# Patient Record
Sex: Male | Born: 1945 | Race: White | Hispanic: No | Smoking: Former smoker
Health system: Southern US, Community
[De-identification: ages and names within clinical notes are randomized; demographics above are authoritative.]

## PROBLEM LIST (undated history)

## (undated) DIAGNOSIS — I6529 Occlusion and stenosis of unspecified carotid artery: Secondary | ICD-10-CM

## (undated) DIAGNOSIS — M1990 Unspecified osteoarthritis, unspecified site: Secondary | ICD-10-CM

## (undated) DIAGNOSIS — N189 Chronic kidney disease, unspecified: Secondary | ICD-10-CM

## (undated) DIAGNOSIS — E039 Hypothyroidism, unspecified: Secondary | ICD-10-CM

## (undated) DIAGNOSIS — C801 Malignant (primary) neoplasm, unspecified: Secondary | ICD-10-CM

## (undated) DIAGNOSIS — J449 Chronic obstructive pulmonary disease, unspecified: Secondary | ICD-10-CM

## (undated) DIAGNOSIS — G7000 Myasthenia gravis without (acute) exacerbation: Secondary | ICD-10-CM

## (undated) DIAGNOSIS — I509 Heart failure, unspecified: Secondary | ICD-10-CM

## (undated) DIAGNOSIS — R0602 Shortness of breath: Secondary | ICD-10-CM

## (undated) DIAGNOSIS — K219 Gastro-esophageal reflux disease without esophagitis: Secondary | ICD-10-CM

## (undated) DIAGNOSIS — I739 Peripheral vascular disease, unspecified: Secondary | ICD-10-CM

## (undated) DIAGNOSIS — I499 Cardiac arrhythmia, unspecified: Secondary | ICD-10-CM

## (undated) DIAGNOSIS — G20A1 Parkinson's disease without dyskinesia, without mention of fluctuations: Secondary | ICD-10-CM

## (undated) DIAGNOSIS — R200 Anesthesia of skin: Secondary | ICD-10-CM

## (undated) DIAGNOSIS — J42 Unspecified chronic bronchitis: Secondary | ICD-10-CM

## (undated) DIAGNOSIS — M4807 Spinal stenosis, lumbosacral region: Secondary | ICD-10-CM

## (undated) DIAGNOSIS — I2089 Other forms of angina pectoris: Secondary | ICD-10-CM

## (undated) DIAGNOSIS — E7800 Pure hypercholesterolemia, unspecified: Secondary | ICD-10-CM

## (undated) DIAGNOSIS — I10 Essential (primary) hypertension: Secondary | ICD-10-CM

## (undated) DIAGNOSIS — I4891 Unspecified atrial fibrillation: Secondary | ICD-10-CM

## (undated) DIAGNOSIS — N183 Chronic kidney disease, stage 3 unspecified: Secondary | ICD-10-CM

## (undated) DIAGNOSIS — G4730 Sleep apnea, unspecified: Secondary | ICD-10-CM

## (undated) DIAGNOSIS — M5030 Other cervical disc degeneration, unspecified cervical region: Secondary | ICD-10-CM

## (undated) DIAGNOSIS — I208 Other forms of angina pectoris: Secondary | ICD-10-CM

## (undated) HISTORY — DX: Heart failure, unspecified: I50.9

## (undated) HISTORY — PX: ATRIAL FIBRILLATION ABLATION: EP1191

## (undated) HISTORY — PX: COLONOSCOPY: SHX174

## (undated) HISTORY — DX: Parkinson's disease without dyskinesia, without mention of fluctuations: G20.A1

## (undated) HISTORY — PX: BACK SURGERY: SHX140

## (undated) HISTORY — PX: BILATERAL CARPAL TUNNEL RELEASE: SHX6508

## (undated) HISTORY — PX: HERNIA REPAIR: SHX51

## (undated) HISTORY — PX: CARDIAC CATHETERIZATION: SHX172

---

## 2004-05-18 ENCOUNTER — Other Ambulatory Visit: Payer: Self-pay

## 2005-12-02 ENCOUNTER — Emergency Department: Payer: Self-pay | Admitting: Emergency Medicine

## 2005-12-03 ENCOUNTER — Other Ambulatory Visit: Payer: Self-pay

## 2009-05-08 ENCOUNTER — Ambulatory Visit: Payer: Self-pay | Admitting: Gastroenterology

## 2011-06-06 ENCOUNTER — Ambulatory Visit: Payer: Self-pay | Admitting: Unknown Physician Specialty

## 2011-07-13 ENCOUNTER — Encounter (HOSPITAL_COMMUNITY): Payer: Self-pay

## 2011-07-13 ENCOUNTER — Encounter (HOSPITAL_COMMUNITY)
Admission: RE | Admit: 2011-07-13 | Discharge: 2011-07-13 | Disposition: A | Discharge status: Home / Self Care | Payer: 59 | Source: Ambulatory Visit | Attending: Neurosurgery | Admitting: Neurosurgery

## 2011-07-13 HISTORY — DX: Hypothyroidism, unspecified: E03.9

## 2011-07-13 HISTORY — DX: Essential (primary) hypertension: I10

## 2011-07-13 LAB — BASIC METABOLIC PANEL
CO2: 24 mEq/L (ref 19–32)
Calcium: 9.7 mg/dL (ref 8.4–10.5)
Creatinine, Ser: 1.38 mg/dL — ABNORMAL HIGH (ref 0.50–1.35)
Glucose, Bld: 84 mg/dL (ref 70–99)
Sodium: 143 mEq/L (ref 135–145)

## 2011-07-13 LAB — DIFFERENTIAL
Lymphocytes Relative: 36 % (ref 12–46)
Lymphs Abs: 3.2 10*3/uL (ref 0.7–4.0)
Monocytes Relative: 9 % (ref 3–12)
Neutro Abs: 4.4 10*3/uL (ref 1.7–7.7)
Neutrophils Relative %: 50 % (ref 43–77)

## 2011-07-13 LAB — CBC
Hemoglobin: 12.1 g/dL — ABNORMAL LOW (ref 13.0–17.0)
MCH: 30.4 pg (ref 26.0–34.0)
MCV: 90.5 fL (ref 78.0–100.0)
RBC: 3.98 MIL/uL — ABNORMAL LOW (ref 4.22–5.81)

## 2011-07-13 LAB — ABO/RH: ABO/RH(D): B NEG

## 2011-07-13 LAB — SURGICAL PCR SCREEN: Staphylococcus aureus: NEGATIVE

## 2011-07-13 LAB — TYPE AND SCREEN: Antibody Screen: NEGATIVE

## 2011-07-13 NOTE — Patient Instructions (Addendum)
Pain Relief Preoperatively and Postoperatively Being a good patient does not mean being a silent one.If you have questions, problems, or concerns about the pain you may feel after surgery, let your caregiver know.Patients have the right to assessment and management of pain. The treatment of pain after surgery is important to speed up recovery and return to normal activities. Severe pain after surgery, and the fear or anxiety associated with that pain, may cause extreme discomfort that:  Prevents sleep.   Decreases the ability to breathe deeply and cough (causing pneumonia or other upper airway infections).   Causes your heart to beat faster and your blood pressure to be higher.   Increases the risk for constipation and bloating.   Decreases the ability of wounds to heal.   May result in depression, increased anxiety, and feelings of helplessness.  Relief of pain before surgery is also important because it will lessen the pain after surgery. Patients who receive both pain relief before and after surgery experience greater pain relief than those who only receive pain relief after the surgery. Let your caregiver know if you are having uncontrolled pain. [place] your nurse immediately if you are in pain after your surgery.This is very important.Pain after surgery is more difficult to manage if it is permitted to become severe, so prompt and adequate treatment of acute pain is necessary. Your caregivers follow policies and procedures about the management of patient pain.These guidelines should be explained to you before surgery.Plans for pain control after surgery must be mutually decided upon and instituted with your full understanding and agreement.Do not be afraid to ask questions of your physicians or nurses regarding the care you are receiving.There are many different ways your caregivers will attempt to control your pain.These may include, but are not limited  to:  Opioids (narcotics).   Nonsteroidal anti-inflammatory drugs.   Medicine that numbs the area (local anesthetic) including:   An injection of pain medicine near where the pain is (local infiltration).   An injection of pain medicine near the nerve that controls the sensation to a specific part of the body (peripheral nerve block).   Medicine continuously put in the spine to block pain (epidural).   Medicine put in the spine to block pain (spinal).   Steroids.   Physical therapy.   Heat and cold.   Compression, such as wrapping an elastic bandage around the area of pain.   Massage.  These different ways of controlling pain may be used together. An example of a patient having 3 methods of pain control would be a surgical patient given intravenous (IV) morphine, oral ibuprofen, and local anesthetic injected around the surgery site. Different ways of controlling pain used together may also be called multimodal analgesia. Using this approach has many benefits, including being able to do these things sooner:  Eat.   Move around.   Leave the hospital.  Moderate to moderately severe acute pain after surgery may respond to opioids (morphine, demerol, or dilaudid).Non-narcotic medicines are often combined with opioids to improve pain relief, diminish the risk of side effects, and reduce the chance of addiction.If you follow your caregiver's directions about taking narcotic medicine, and you do not have a history of substance abuse, your risk of becoming addicted is exceptionally small.Opioids, used for the treatment of acute pain after surgery, are given for short periods of time in careful doses to prevent addiction. If used as directed by your caregiver, narcotics are:  Effective in managing acute  pain after surgery.   Safe.   Not addictive.  HOME CARE INSTRUCTIONS  Only take over-the-counter or prescription medicines for pain, discomfort, or fever as directed by your  caregiver. Do not take aspirin. Aspirin increases the possibility for bleeding.   Get proper rest.   Apply an ice pack to the site of your surgery for the first 2 days, if directed by your surgeon.  SEEK IMMEDIATE MEDICAL CARE IF: There is increasing pain, which is not controlled with medicines. Document Released: 11/19/2002 Document Revised: 03/14/2011 Document Reviewed: 11/23/2010 [facility] Patient Information 2012 [place], [place].MRSA Overview MRSA stands for methicillin-resistant Staphylococcus aureus. It is a type of bacteria that is resistant to some common antibiotics. It can cause infections in the skin and many other places in the body. Staphylococcus aureus, often called "staph," is a bacteria that normally lives on the skin or in the nose. Staph on the surface of the skin or in the nose does not cause problems. However, if the staph enters the body through a cut, wound, or break in the skin, an infection can happen. Up until recently, infections with the MRSA type of staph mainly occurred in hospitals and other healthcare settings. There are now increasing problems with MRSA infections in the community as well. Infections with MRSA may be very serious or even life-threatening. Most MRSA infections are acquired in one of two ways:  Healthcare-associated MRSA (HA-MRSA)   This can be acquired by people in any healthcare setting. MRSA can be a big problem for hospitalized people, people in nursing homes, people in rehabilitation facilities, people with weakened immune systems, dialysis patients, and those who have had surgery.   Community-associated MRSA (CA-MRSA)   Community spread of MRSA is becoming more common. It is known to spread in crowded settings, in jails and prisons, and in situations where there is close skin-to-skin contact, such as during sporting events or in locker rooms. MRSA can be spread through shared items, such as children's toys, razors, towels, or sports equipment.   CAUSES  All staph, including MRSA, are normally harmless unless they enter the body through a scratch, cut, or wound, such as with surgery. All staph, including MRSA, can be spread from person-to-person by touching contaminated objects or through direct contact. SPECIAL GROUPS MRSA can present problems for special groups of people. Some of these groups include:  Breastfeeding women.   The most common problem is MRSA infection of the breast (mastitis). There is evidence that MRSA can be passed to an infant from infected breast milk. Your caregiver may recommend that you stop breastfeeding until the mastitis is under control.   If you are breastfeeding and have a MRSA infection in a place other than the breast, you may usually continue breastfeeding while under treatment. If taking antibiotics, ask your caregiver if it is safe to continue breastfeeding while taking your prescribed medicines.   Neonates (babies from birth to 44 month old) and infants (babies from 28 month to 61 year old).   There is evidence that MRSA can be passed to a newborn at birth if the mother has MRSA on the skin, in or around the birth canal, or an infection in the uterus, cervix, or vagina. MRSA infection can have the same appearance as a normal newborn or infant rash or several other skin infections. This can make it hard to diagnose MRSA.   Immune compromised people.   If you have an immune system problem, you may have a higher chance of developing  a MRSA infection.   People after any type of surgery.   Staph in general, including MRSA, is the most common cause of infections occurring at the site of recent surgery.   People on long-term steroid medicines.   These kinds of medicines can lower your resistance to infection. This can increase your chance of getting MRSA.   People who have had frequent hospitalizations, live in nursing homes or other residential care facilities, have venous or urinary catheters, or have  taken multiple courses of antibiotic therapy for any reason.  DIAGNOSIS  Diagnosis of MRSA is done by cultures of fluid samples that may come from:  Swabs taken from cuts or wounds in infected areas.   Nasal swabs.   Saliva or deep cough specimens from the lungs (sputum).   Urine.   Blood.  Many people are "colonized" with MRSA but have no signs of infection. This means that people carry the MRSA germ on their skin or in their nose and may never develop MRSA infection.  TREATMENT  Treatment varies and is based on how serious, how deep, or how extensive the infection is. For example:  Some skin infections, such as a small boil or abscess, may be treated by draining yellowish-white fluid (pus) from the site of the infection.   Deeper or more widespread soft tissue infections are usually treated with surgery to drain pus and with antibiotic medicine given by vein or by mouth. This may be recommended even if you are pregnant.   Serious infections may require a hospital stay.  If antibiotics are given, they may be needed for several weeks. PREVENTION  Because many people are colonized with staph, including MRSA, preventing the spread of the bacteria from person-to-person is most important. The best way to prevent the spread of bacteria and other germs is through proper hand washing or by using alcohol-based hand disinfectants. The following are other ways to help prevent MRSA infection within the hospital and community settings.   Healthcare settings:   Strict hand washing or hand disinfection procedures need to be followed before and after touching every patient.   Patients infected with MRSA are placed in isolation to prevent the spread of the bacteria.   Healthcare workers need to wear disposable gowns and gloves when touching or caring for patients infected with MRSA. Visitors may also be asked to wear a gown and gloves.   Hospital surfaces need to be disinfected frequently.    Community settings:   NIKE frequently with soap and water for at least 15 seconds. Otherwise, use alcohol-based hand disinfectants when soap and water is not available.   Make sure people who live with you wash their hands often, too.   Do not share personal items. For example, avoid sharing razors and other personal hygiene items, towels, clothing, and athletic equipment.   Wash and dry your clothes and bedding at the warmest temperatures recommended on the labels.   Keep wounds covered. Pus from infected sores may contain MRSA and other bacteria. Keep cuts and abrasions clean and covered with germ-free (sterile), dry bandages until they are healed.   If you have a wound that appears infected, ask your caregiver if a culture for MRSA and other bacteria should be done.   If you are breastfeeding, talk to your caregiver about MRSA. You may be asked to temporarily stop breastfeeding.  HOME CARE INSTRUCTIONS   Take your antibiotics as directed. Finish them even if you start to feel better.  Avoid close contact with those around you as much as possible. Do not use towels, razors, toothbrushes, bedding, or other items that will be used by others.   To fight the infection, follow your caregiver's instructions for wound care. Wash your hands before and after changing your bandages.   If you have an intravascular device, such as a catheter, make sure you know how to care for it.   Be sure to tell any healthcare providers that you have MRSA so they are aware of your infection.  SEEK IMMEDIATE MEDICAL CARE IF:   The infection appears to be getting worse. Signs include:   Increased warmth, redness, or tenderness around the wound site.   A red line that extends from the infection site.   A dark color in the area around the infection.   Wound drainage that is tan, yellow, or green.   A bad smell coming from the wound.   You feel sick to your stomach (nauseous) and throw up  (vomit) or cannot keep medicine down.   You have a fever.   Your baby is older than 3 months with a rectal temperature of 102 F (38.9 C) or higher.   Your baby is 25 months old or younger with a rectal temperature of 100.4 F (38 C) or higher.   You have difficulty breathing.  MAKE SURE YOU:   Understand these instructions.   Will watch your condition.   Will get help right away if you are not doing well or get worse.  Document Released: 08/29/2005 Document Revised: 05/11/2011 Document Reviewed: 12/01/2010 [facility] Patient Information 2012 [place], [place].

## 2011-07-13 NOTE — Pre-Procedure Instructions (Signed)
[address] E[address]ITHORPE  07/13/2011   Your procedure is scheduled on:  Mon, Nov 5th  Report to [facility] at call @ 0800  AM to find out arrival time  Call this number if you have problems the morning of surgery: [phone]   Remember:   Do not eat food:After Midnight.  Do not drink clear liquids: 4 Hours before arrival.  Take these medicines the morning of surgery with A SIP OF WATER: Levothyroxine,Gabapentin,and pain pill(if needed)   Do not wear jewelry, make-up or nail polish.  Do not wear lotions, powders, or perfumes. You may wear deodorant.  Do not shave 48 hours prior to surgery.  Do not bring valuables to the hospital.  Contacts, dentures or bridgework may not be worn into surgery.  Leave suitcase in the car. After surgery it may be brought to your room.  For patients admitted to the hospital, checkout time is 11:00 AM the day of discharge.   Patients discharged the day of surgery will not be allowed to drive home.  Name and phone number of your driver: family [phone]  Special Instructions: CHG Shower Use Special Wash: 1/2 bottle night before surgery and 1/2 bottle morning of surgery.   Please read over the following fact sheets that you were given: Pain Booklet, Coughing and Deep Breathing, Blood Transfusion Information, MRSA Information and Surgical Site Infection Prevention

## 2011-07-15 ENCOUNTER — Ambulatory Visit (HOSPITAL_COMMUNITY): Admission: RE | Admit: 2011-07-15 | Payer: BC Managed Care – PPO | Source: Ambulatory Visit | Admitting: Neurosurgery

## 2011-07-17 MED ORDER — CEFAZOLIN SODIUM 1-5 GM-% IV SOLN
1.0000 g | INTRAVENOUS | Status: DC
  Filled 2011-07-17: qty 50

## 2011-07-18 ENCOUNTER — Ambulatory Visit (HOSPITAL_COMMUNITY): Payer: 59

## 2011-07-18 ENCOUNTER — Observation Stay (HOSPITAL_COMMUNITY)
Admission: RE | Admit: 2011-07-18 | Discharge: 2011-07-19 | DRG: 473 | Disposition: A | Discharge status: Home / Self Care | Payer: 59 | Source: Ambulatory Visit | Attending: Neurosurgery | Admitting: Neurosurgery

## 2011-07-18 ENCOUNTER — Ambulatory Visit (HOSPITAL_COMMUNITY): Payer: 59 | Admitting: Anesthesiology

## 2011-07-18 ENCOUNTER — Encounter (HOSPITAL_COMMUNITY): Payer: Self-pay | Admitting: *Deleted

## 2011-07-18 ENCOUNTER — Encounter (HOSPITAL_COMMUNITY)
Admission: RE | Disposition: A | Discharge status: Home / Self Care | Payer: Self-pay | Source: Ambulatory Visit | Attending: Neurosurgery

## 2011-07-18 ENCOUNTER — Encounter (HOSPITAL_COMMUNITY): Payer: Self-pay | Admitting: Anesthesiology

## 2011-07-18 ENCOUNTER — Other Ambulatory Visit: Payer: Self-pay

## 2011-07-18 DIAGNOSIS — K219 Gastro-esophageal reflux disease without esophagitis: Secondary | ICD-10-CM | POA: Insufficient documentation

## 2011-07-18 DIAGNOSIS — I10 Essential (primary) hypertension: Secondary | ICD-10-CM | POA: Insufficient documentation

## 2011-07-18 DIAGNOSIS — M47812 Spondylosis without myelopathy or radiculopathy, cervical region: Secondary | ICD-10-CM | POA: Insufficient documentation

## 2011-07-18 DIAGNOSIS — M5020 Other cervical disc displacement, unspecified cervical region: Principal | ICD-10-CM | POA: Insufficient documentation

## 2011-07-18 DIAGNOSIS — E039 Hypothyroidism, unspecified: Secondary | ICD-10-CM | POA: Insufficient documentation

## 2011-07-18 HISTORY — PX: ANTERIOR CERVICAL DECOMP/DISCECTOMY FUSION: SHX1161

## 2011-07-18 SURGERY — ANTERIOR CERVICAL DECOMPRESSION/DISCECTOMY FUSION 2 LEVELS
Anesthesia: General | Site: Neck | Wound class: Clean

## 2011-07-18 MED ORDER — ALUM & MAG HYDROXIDE-SIMETH 400-400-40 MG/5ML PO SUSP
30.0000 mL | Freq: Four times a day (QID) | ORAL | Status: DC | PRN
  Filled 2011-07-18: qty 30

## 2011-07-18 MED ORDER — LACTATED RINGERS IV SOLN
INTRAVENOUS | Status: DC | PRN
  Administered 2011-07-18 (×2): via INTRAVENOUS

## 2011-07-18 MED ORDER — DEXAMETHASONE SODIUM PHOSPHATE 4 MG/ML IJ SOLN
INTRAMUSCULAR | Status: DC | PRN
  Administered 2011-07-18: 10 mg via INTRAVENOUS

## 2011-07-18 MED ORDER — VITAMIN D3 25 MCG (1000 UNIT) PO TABS
1000.0000 [IU] | ORAL_TABLET | Freq: Every day | ORAL | Status: DC
  Administered 2011-07-18: 1000 [IU] via ORAL
  Filled 2011-07-18 (×2): qty 1

## 2011-07-18 MED ORDER — ONDANSETRON HCL 4 MG/2ML IJ SOLN: 4.0000 mg | INTRAMUSCULAR | Status: DC | PRN

## 2011-07-18 MED ORDER — ACETAMINOPHEN 650 MG RE SUPP: 650.0000 mg | RECTAL | Status: DC | PRN

## 2011-07-18 MED ORDER — CYCLOBENZAPRINE HCL 10 MG PO TABS: 10.0000 mg | ORAL_TABLET | Freq: Every evening | ORAL | Status: DC | PRN

## 2011-07-18 MED ORDER — MENTHOL 3 MG MT LOZG
1.0000 | LOZENGE | OROMUCOSAL | Status: DC | PRN
  Administered 2011-07-19: 3 mg via ORAL
  Filled 2011-07-18: qty 9

## 2011-07-18 MED ORDER — ACETAMINOPHEN 325 MG PO TABS: 650.0000 mg | ORAL_TABLET | ORAL | Status: DC | PRN

## 2011-07-18 MED ORDER — SODIUM CHLORIDE 0.9 % IJ SOLN: 3.0000 mL | INTRAMUSCULAR | Status: DC | PRN

## 2011-07-18 MED ORDER — LEVOTHYROXINE SODIUM 88 MCG PO TABS
88.0000 ug | ORAL_TABLET | Freq: Every day | ORAL | Status: DC
  Administered 2011-07-18: 88 ug via ORAL
  Filled 2011-07-18 (×2): qty 1

## 2011-07-18 MED ORDER — GLYCOPYRROLATE 0.2 MG/ML IJ SOLN
INTRAMUSCULAR | Status: DC | PRN
  Administered 2011-07-18: 0.1 mg via INTRAVENOUS
  Administered 2011-07-18: .6 mg via INTRAVENOUS

## 2011-07-18 MED ORDER — DOCUSATE SODIUM 100 MG PO CAPS
100.0000 mg | ORAL_CAPSULE | Freq: Two times a day (BID) | ORAL | Status: DC
  Administered 2011-07-18: 100 mg via ORAL
  Filled 2011-07-18: qty 1

## 2011-07-18 MED ORDER — OXYCODONE-ACETAMINOPHEN 10-325 MG PO TABS: 1.0000 | ORAL_TABLET | ORAL | Status: DC | PRN

## 2011-07-18 MED ORDER — NEOSTIGMINE METHYLSULFATE 1 MG/ML IJ SOLN
INTRAMUSCULAR | Status: DC | PRN
  Administered 2011-07-18: 3 mg via INTRAVENOUS

## 2011-07-18 MED ORDER — ROCURONIUM BROMIDE 100 MG/10ML IV SOLN
INTRAVENOUS | Status: DC | PRN
  Administered 2011-07-18: 50 mg via INTRAVENOUS

## 2011-07-18 MED ORDER — PHENOL 1.4 % MT LIQD: 1.0000 | OROMUCOSAL | Status: DC | PRN

## 2011-07-18 MED ORDER — LISINOPRIL 10 MG PO TABS
10.0000 mg | ORAL_TABLET | Freq: Every day | ORAL | Status: DC
  Administered 2011-07-18: 10 mg via ORAL
  Filled 2011-07-18 (×2): qty 1

## 2011-07-18 MED ORDER — ONDANSETRON HCL 4 MG/2ML IJ SOLN
INTRAMUSCULAR | Status: DC | PRN
  Administered 2011-07-18: 4 mg via INTRAVENOUS

## 2011-07-18 MED ORDER — CEFAZOLIN SODIUM 1-5 GM-% IV SOLN
INTRAVENOUS | Status: DC | PRN
  Administered 2011-07-18: 2 g via INTRAVENOUS

## 2011-07-18 MED ORDER — SODIUM CHLORIDE 0.9 % IR SOLN
Status: DC | PRN
  Administered 2011-07-18: 1000 mL

## 2011-07-18 MED ORDER — CYCLOBENZAPRINE HCL 10 MG PO TABS
10.0000 mg | ORAL_TABLET | Freq: Three times a day (TID) | ORAL | Status: DC | PRN
  Administered 2011-07-19: 10 mg via ORAL
  Filled 2011-07-18: qty 1

## 2011-07-18 MED ORDER — HEMOSTATIC AGENTS (NO CHARGE) OPTIME
TOPICAL | Status: DC | PRN
  Administered 2011-07-18: 1 via TOPICAL

## 2011-07-18 MED ORDER — THROMBIN 5000 UNITS EX KIT
PACK | CUTANEOUS | Status: DC | PRN
  Administered 2011-07-18: 2 via TOPICAL

## 2011-07-18 MED ORDER — PROPOFOL 10 MG/ML IV EMUL
INTRAVENOUS | Status: DC | PRN
  Administered 2011-07-18: 200 mg via INTRAVENOUS

## 2011-07-18 MED ORDER — OXYCODONE HCL 5 MG PO TABS: 5.0000 mg | ORAL_TABLET | ORAL | Status: DC | PRN

## 2011-07-18 MED ORDER — GABAPENTIN 300 MG PO CAPS
300.0000 mg | ORAL_CAPSULE | Freq: Every day | ORAL | Status: DC
  Administered 2011-07-18: 300 mg via ORAL
  Filled 2011-07-18 (×2): qty 1

## 2011-07-18 MED ORDER — SODIUM CHLORIDE 0.9 % IR SOLN
Status: DC | PRN
  Administered 2011-07-18: 14:00:00

## 2011-07-18 MED ORDER — SODIUM CHLORIDE 0.9 % IJ SOLN
3.0000 mL | Freq: Two times a day (BID) | INTRAMUSCULAR | Status: DC
  Administered 2011-07-18: 3 mL via INTRAVENOUS

## 2011-07-18 MED ORDER — HYDROMORPHONE HCL PF 1 MG/ML IJ SOLN
0.2500 mg | INTRAMUSCULAR | Status: DC | PRN
  Administered 2011-07-18 (×3): 0.5 mg via INTRAVENOUS

## 2011-07-18 MED ORDER — HYDROCODONE-ACETAMINOPHEN 5-325 MG PO TABS
1.0000 | ORAL_TABLET | ORAL | Status: DC | PRN
  Administered 2011-07-19: 2 via ORAL
  Filled 2011-07-18: qty 2

## 2011-07-18 MED ORDER — HYDROMORPHONE HCL PF 1 MG/ML IJ SOLN: 0.5000 mg | INTRAMUSCULAR | Status: DC | PRN

## 2011-07-18 MED ORDER — OXYCODONE-ACETAMINOPHEN 5-325 MG PO TABS
1.0000 | ORAL_TABLET | ORAL | Status: DC | PRN
  Administered 2011-07-18: 1 via ORAL
  Filled 2011-07-18: qty 1

## 2011-07-18 MED ORDER — ZOLPIDEM TARTRATE 5 MG PO TABS: 5.0000 mg | ORAL_TABLET | Freq: Every evening | ORAL | Status: DC | PRN

## 2011-07-18 MED ORDER — MIDAZOLAM HCL 5 MG/5ML IJ SOLN
INTRAMUSCULAR | Status: DC | PRN
  Administered 2011-07-18: 2 mg via INTRAVENOUS

## 2011-07-18 MED ORDER — CEFAZOLIN SODIUM 1-5 GM-% IV SOLN
1.0000 g | Freq: Three times a day (TID) | INTRAVENOUS | Status: AC
  Administered 2011-07-18 – 2011-07-19 (×2): 1 g via INTRAVENOUS
  Filled 2011-07-18 (×2): qty 50

## 2011-07-18 MED ORDER — FENTANYL CITRATE 0.05 MG/ML IJ SOLN
INTRAMUSCULAR | Status: DC | PRN
  Administered 2011-07-18: 100 ug via INTRAVENOUS
  Administered 2011-07-18: 150 ug via INTRAVENOUS

## 2011-07-18 MED ORDER — SIMVASTATIN 20 MG PO TABS
20.0000 mg | ORAL_TABLET | Freq: Every day | ORAL | Status: DC
  Filled 2011-07-18: qty 1

## 2011-07-18 SURGICAL SUPPLY — 54 items
BAG DECANTER FOR FLEXI CONT (MISCELLANEOUS) ×2 IMPLANT
BENZOIN TINCTURE PRP APPL 2/3 (GAUZE/BANDAGES/DRESSINGS) ×2 IMPLANT
BRUSH SCRUB EZ PLAIN DRY (MISCELLANEOUS) ×2 IMPLANT
BUR MATCHSTICK NEURO 3.0 LAGG (BURR) ×2 IMPLANT
CANISTER SUCTION 2500CC (MISCELLANEOUS) ×2 IMPLANT
CLOTH BEACON ORANGE TIMEOUT ST (SAFETY) ×2 IMPLANT
CONT SPEC 4OZ CLIKSEAL STRL BL (MISCELLANEOUS) ×2 IMPLANT
DRAPE C-ARM 42X72 X-RAY (DRAPES) ×4 IMPLANT
DRAPE LAPAROTOMY 100X72 PEDS (DRAPES) ×2 IMPLANT
DRAPE MICROSCOPE ZEISS OPMI (DRAPES) ×2 IMPLANT
DRAPE POUCH INSTRU U-SHP 10X18 (DRAPES) ×2 IMPLANT
DRILL BIT ADJUSTABLE (BIT) ×2 IMPLANT
ELECT COATED BLADE 2.86 ST (ELECTRODE) ×2 IMPLANT
ELECT REM PT RETURN 9FT ADLT (ELECTROSURGICAL) ×2
ELECTRODE REM PT RTRN 9FT ADLT (ELECTROSURGICAL) ×1 IMPLANT
GAUZE SPONGE 4X4 16PLY XRAY LF (GAUZE/BANDAGES/DRESSINGS) IMPLANT
GLOVE BIO SURGEON STRL SZ 6.5 (GLOVE) ×2 IMPLANT
GLOVE BIOGEL PI IND STRL 6.5 (GLOVE) ×3 IMPLANT
GLOVE BIOGEL PI INDICATOR 6.5 (GLOVE) ×3
GLOVE ECLIPSE 8.5 STRL (GLOVE) ×2 IMPLANT
GLOVE EXAM NITRILE LRG STRL (GLOVE) IMPLANT
GLOVE EXAM NITRILE MD LF STRL (GLOVE) IMPLANT
GLOVE EXAM NITRILE XL STR (GLOVE) IMPLANT
GLOVE EXAM NITRILE XS STR PU (GLOVE) IMPLANT
GLOVE INDICATOR 7.0 STRL GRN (GLOVE) IMPLANT
GLOVE SS BIOGEL STRL SZ 6.5 (GLOVE) ×2 IMPLANT
GLOVE SUPERSENSE BIOGEL SZ 6.5 (GLOVE) ×2
GOWN BRE IMP SLV AUR LG STRL (GOWN DISPOSABLE) ×4 IMPLANT
GOWN BRE IMP SLV AUR XL STRL (GOWN DISPOSABLE) ×2 IMPLANT
GOWN STRL REIN 2XL LVL4 (GOWN DISPOSABLE) IMPLANT
HEAD HALTER (SOFTGOODS) ×2 IMPLANT
HEMOSTAT SURGICEL 2X14 (HEMOSTASIS) IMPLANT
KIT BASIN OR (CUSTOM PROCEDURE TRAY) ×2 IMPLANT
KIT ROOM TURNOVER OR (KITS) ×2 IMPLANT
NEEDLE SPNL 20GX3.5 QUINCKE YW (NEEDLE) ×2 IMPLANT
NS IRRIG 1000ML POUR BTL (IV SOLUTION) ×2 IMPLANT
PACK LAMINECTOMY NEURO (CUSTOM PROCEDURE TRAY) ×2 IMPLANT
PAD ARMBOARD 7.5X6 YLW CONV (MISCELLANEOUS) ×6 IMPLANT
PLATE ACP ATL VISION 45MM (Plate) ×2 IMPLANT
RUBBERBAND STERILE (MISCELLANEOUS) ×4 IMPLANT
SCREW VA ALT VISION 4.0X13 (Screw) ×12 IMPLANT
SPACER BONE CORNERSTONE 6X14 (Orthopedic Implant) ×2 IMPLANT
SPACER BONE CORNERSTONE 7X14 (Orthopedic Implant) ×2 IMPLANT
SPONGE GAUZE 4X4 12PLY (GAUZE/BANDAGES/DRESSINGS) ×2 IMPLANT
SPONGE INTESTINAL PEANUT (DISPOSABLE) ×2 IMPLANT
SPONGE SURGIFOAM ABS GEL SZ50 (HEMOSTASIS) ×2 IMPLANT
STRIP CLOSURE SKIN 1/2X4 (GAUZE/BANDAGES/DRESSINGS) ×2 IMPLANT
SUT PDS AB 5-0 P3 18 (SUTURE) ×2 IMPLANT
SUT VIC AB 3-0 SH 8-18 (SUTURE) ×2 IMPLANT
SYR 20ML ECCENTRIC (SYRINGE) ×2 IMPLANT
TAPE CLOTH 4X10 WHT NS (GAUZE/BANDAGES/DRESSINGS) ×2 IMPLANT
TOWEL OR 17X24 6PK STRL BLUE (TOWEL DISPOSABLE) ×2 IMPLANT
TOWEL OR 17X26 10 PK STRL BLUE (TOWEL DISPOSABLE) ×2 IMPLANT
WATER STERILE IRR 1000ML POUR (IV SOLUTION) ×2 IMPLANT

## 2011-07-18 NOTE — Anesthesia Postprocedure Evaluation (Signed)
  Anesthesia Post-op Note  Patient: Francisco [name]  Procedure(s) Performed:  ANTERIOR CERVICAL DECOMPRESSION/DISCECTOMY FUSION 2 LEVELS - cervical five-six, cervical six-seven anterior cervical discectomy and fusion  Patient Location: PACU  Anesthesia Type: General  Level of Consciousness: awake  Airway and Oxygen Therapy: Patient Spontanous Breathing and Patient connected to nasal cannula oxygen  Post-op Pain: mild  Post-op Assessment: Post-op Vital signs reviewed, Patient's Cardiovascular Status Stable, Respiratory Function Stable and Patent Airway  Post-op Vital Signs: Reviewed and stable  Complications: No apparent anesthesia complications

## 2011-07-18 NOTE — Anesthesia Procedure Notes (Addendum)
Procedure Name: Intubation Date/Time: 07/18/2011 1:40 PM Performed by: Caryn Bee Pre-anesthesia Checklist: Patient identified, Emergency Drugs available, Suction available, Patient being monitored and Timeout performed Patient Re-evaluated:Patient Re-evaluated prior to inductionOxygen Delivery Method: Circle System Utilized Preoxygenation: Pre-oxygenation with 100% oxygen Intubation Type: IV induction Ventilation: Oral airway inserted - appropriate to patient size and Mask ventilation without difficulty Laryngoscope Size: Mac and 4 Grade View: Grade III Tube type: Oral Number of attempts: 1 Airway Equipment and Method: stylet

## 2011-07-18 NOTE — H&P (Signed)
Francisco [name] is an 65 y.o. male.   Chief Complaint: Left-sided neck pain HPI: The patient is a 65 year old male who has been having difficulty with severe left-sided neck pain with radiation to his left C. for the past 6 weeks. The pain begins in his left paracervical region and extends into his left shoulder arm and forearm and the ulnar aspect of his left hand. He has no right-sided symptoms. He has no lower joint dysfunction. He has no bowel or bladder dysfunction. The symptoms are quite bothersome for him. He has difficulty sleeping secondary to this pain. He denies weakness.  Past Medical History  Diagnosis Date  . Hypothyroidism     pt takes Levothyroxine daily  . Hypertension     doesn't have a cardiologist;pt is maintained by medical md for htn;requested ekg[facility]cxr from [facility]    Past Surgical History  Procedure Date  . Back surgery     in 1985  . Hernia repair     inguinal hernia repair in 1997  . Cardiac catheterization     2005 at [facility]    History reviewed. No pertinent family history. Social History:  reports that he has quit smoking. He does not have any smokeless tobacco history on file. He reports that he drinks alcohol. He reports that he does not use illicit drugs.  Allergies:  Allergies  Allergen Reactions  . Codeine Nausea And Vomiting    Medications Prior to Admission  Medication Dose Route Frequency Provider Last Rate Last Dose  . ceFAZolin (ANCEF) IVPB 1 g/50 mL premix  1 g Intravenous 60 min Pre-Op Charisma Charlot A Carisa Backhaus       Medications Prior to Admission  Medication Sig Dispense Refill  . Ascorbic Acid (VITAMIN C PO) Take 1 tablet by mouth daily.        . cholecalciferol (VITAMIN D) 1000 UNITS tablet Take 1,000 Units by mouth daily.        . cyclobenzaprine (FLEXERIL) 10 MG tablet Take 10 mg by mouth at bedtime as needed. For muscle spasms       . gabapentin (NEURONTIN) 300 MG capsule Take 300 mg by mouth at bedtime.        Marland Kitchen levothyroxine (SYNTHROID,  LEVOTHROID) 88 MCG tablet Take 88 mcg by mouth daily.        Marland Kitchen lisinopril (PRINIVIL,ZESTRIL) 10 MG tablet Take 10 mg by mouth daily.        Marland Kitchen lovastatin (MEVACOR) 40 MG tablet Take 40 mg by mouth at bedtime.        . meloxicam (MOBIC) 7.5 MG tablet Take 7.5 mg by mouth daily.        Marland Kitchen oxyCODONE-acetaminophen (PERCOCET) 10-325 MG per tablet Take 1 tablet by mouth every 4 (four) hours as needed. For pain         No results found for this or any previous visit (from the past 48 hour(s)). Dg Chest 2 View  07/18/2011  *RADIOLOGY REPORT*  Clinical Data: 65 year old male with planned in the spine surgery. Hypertension.  CHEST - 2 VIEW  Comparison: None.  Findings: Normal lung volumes.  Cardiac size at the upper limits of normal. Other mediastinal contours are within normal limits. Visualized tracheal air column is within normal limits.  No pneumothorax, pulmonary edema, pleural effusion or confluent pulmonary opacity. No acute osseous abnormality identified.  IMPRESSION: No acute cardiopulmonary abnormality.  Original Report Authenticated By: Harley Hallmark, M.D.    Review of Systems  Constitutional: Negative.   HENT: Negative.  Eyes: Negative.   Respiratory: Negative.   Cardiovascular: Negative.   Gastrointestinal: Negative.   Genitourinary: Negative.   Musculoskeletal: Negative.   Skin: Negative.   Neurological: Negative.   Endo/Heme/Allergies: Negative.   Psychiatric/Behavioral: Negative.   All other systems reviewed and are negative.    Blood pressure 145/75, pulse 72, temperature 97.6 F (36.4 C), resp. rate 20, SpO2 95.00%. Physical Exam  Constitutional: He is oriented to person, place, and time. He appears well-developed.  HENT:  Head: Normocephalic and atraumatic.  Eyes: Conjunctivae and EOM are normal. Pupils are equal, round, and reactive to light.  Cardiovascular: Normal rate and regular rhythm.   Respiratory: Breath sounds normal.  GI: Soft. Bowel sounds are normal.    Musculoskeletal: Normal range of motion. He exhibits no edema.  Neurological: He is alert and oriented to person, place, and time.  Skin: Skin is warm.  Psychiatric: He has a normal mood and affect. His behavior is normal. Judgment and thought content normal.   examination of his cervical spine reveals mild cervical spasm with diffuse tenderness. Spurling's maneuver is strongly positive toward the left and negative towards the right. Motor examination reveals a left-sided triceps muscle group weakness grating 45. He has some mild weakness of his left-sided hand intrinsics as well. Otherwise motor strength is intact. Sensory examination reveals decreased sensation to pinprick and light touch in his left C6-C7 and C8 dermatomes. Deep tendon reflexes and normal active. There is only for long track signs. Gait and posture normal.  Imaging studies:  I have reviewed the patient's MRI scan of his cervical spine. 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 I think his symptoms are complicated by his spondylosis and stenosis at the C5-6 level. We discussed options are available for management including both operative and operative care. We discussed in detail the possibility of moving forward at C5-6 and C6-7 anterior cervical discectomy and fusion with allograft and anterior plating. We discussed the risks and benefits and detail including but not limited to risk of anesthesia bleeding infection CSF leak and nerve root injury spinal cord injury fusion failure in addition the patient failure dysphagia dysphonia continued pain and not benefit. The patient has been given the option  as numerous questions and appears to understand. He wishes to proceed with surgery.  Joanell Cressler A 07/18/2011, 12:51 PM

## 2011-07-18 NOTE — Op Note (Signed)
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  Indications:  Patient is a 65 year old male with history of neck and left upper tourniquet pain procedures and weakness consistent with a mixed cervical radiculopathy. Workup demonstrates evidence of significant stenosis at C5-6 and a leftward C6-7 disc herniation causing marked compression of the exiting left-sided C7 nerve root. Patient has failed conservative management and presents now for two-level anterior cervical decompression and fusion.  Operative note:  Patient is placed in the operative table in the supine position. After an adequate level of general anesthesia had been achieved the patient was then positioned with his neck extended and held in place of halter traction. Patient's anterior cervical region was prepped and draped sterilely. 10 blade used to make a linear skin incision overlying the C6 vertebral level. Dissection was carried down sharply to the platysma. Dissection then proceeded along the medial border of the sternoclavicular muscle and carotid sheath. Trachea and esophagus were mobilized and retracted towards the left. Prevertebral fascia stripped off the anterior spinal column. Longus coli muscles was then elevated bilaterally using electrocautery. Disc spaces were marked and using intraoperative fluoroscopy levels were confirmed. The disc space at C5-6 and C6-7 were then incised with a 15 blade in a rectangular fashion. Wide disc space cleanouts were then achieved using pituitary rongeurs forward and backward L. Carlin curettes Kerrison rongeurs and a high-speed drill. All elements the disc removed down to level of the posterior longitudinal  ligament. The microscope was brought into the  field and used throughout the remainder of the discectomy. Remaining aspects of the annulus and osteophytes removed using using the high-speed drill and Kerrison rongeurs. The posterior longitudinal ligament was elevated and resected piecemeal fashion using Kerrison rongeurs. Underlying thecal sac was then exposed. The bodies of C5 and C6 we then undercut using Kerrison rongeurs to complete the central decompression. Decompression then proceeded H. and out each neural foramina. Wide anterior foraminotomies were then performed on the course exiting C6 nerve roots bilaterally. At this point a very thorough decompression had been achieved. There was no injury to the thecal sac or nerve roots. The procedure then repeated at C6-7 again without complication. Findings at this level we were that of a large leftward disc herniation with C7 nerve root compression. Disc spaces and their were then irrigated and hemostasis achieved with Gelfoam. Cornerstone allograft wedges were then packed into place at both levels. These were recessed approximately 1 mm from the anterior cortical margins. Anterior cervical plate was then placed into the C5-C6 and C7 levels. This is an attachment or for Scarpa guidance using 13 mm variable-angle screws to reach it all 3 levels. All screws and final tightening be solidly within bone. Locking screws were engaged at all 3 levels. Final images revealed good position bone graft at the proper for level with normal alignment of the spine. Wound was then irrigated out like solution. Hemostasis was ensured with bipolar cautery. Wounds and close in a typical fashion. There were no apparent complications. The patient tolerated the procedure well. He returns to the recovery room.

## 2011-07-18 NOTE — Anesthesia Preprocedure Evaluation (Addendum)
Anesthesia Evaluation  Patient identified by MRN, date of birth, ID band Patient awake    Reviewed: Allergy & Precautions, H&P , NPO status , Patient's Chart, lab work & pertinent test results  Airway  TM Distance: >3 FB Neck ROM: Full    Dental No notable dental hx.    Pulmonary    Pulmonary exam normal       Cardiovascular hypertension,     Neuro/Psych    GI/Hepatic Neg liver ROS, GERD-  Controlled,  Endo/Other  Hypothyroidism   Renal/GU negative Renal ROS  male genitourinary complaint  Genitourinary negative   Musculoskeletal   Abdominal   Peds  Hematology   Anesthesia Other Findings   Reproductive/Obstetrics                         Anesthesia Physical Anesthesia Plan  ASA: III  Anesthesia Plan:    Post-op Pain Management:    Induction: Intravenous  Airway Management Planned: Oral ETT  Additional Equipment:   Intra-op Plan:   Post-operative Plan: Extubation in OR  Informed Consent: I have reviewed the patients History and Physical, chart, labs and discussed the procedure including the risks, benefits and alternatives for the proposed anesthesia with the patient or authorized representative who has indicated his/her understanding and acceptance.   Dental advisory given  Plan Discussed with: CRNA and Surgeon  Anesthesia Plan Comments:         Anesthesia Quick Evaluation

## 2011-07-18 NOTE — Transfer of Care (Signed)
Immediate Anesthesia Transfer of Care Note  Patient: Francisco [name]  Procedure(s) Performed:  Larose Hires CERVICAL DECOMPRESSION/DISCECTOMY FUSION 2 LEVELS - cervical five-six, cervical six-seven anterior cervical discectomy and fusion  Patient Location: PACU  Anesthesia Type: General  Level of Consciousness: awake, alert  and oriented  Airway & Oxygen Therapy: Patient Spontanous Breathing and Patient connected to nasal cannula oxygen  Post-op Assessment: Report given to PACU RN  Post vital signs: stable  Complications: No apparent anesthesia complications

## 2011-07-18 NOTE — Brief Op Note (Signed)
07/18/2011  4:07 PM  PATIENT:  Francisco [name]  65 y.o. male  PRE-OPERATIVE DIAGNOSIS:  Cervical five-six stenosis and spondylosis, cervical six-seven herniated nucleus pulposis, cervical seven radiculopathy  POST-OPERATIVE DIAGNOSIS:  Cervical five-six stenosis and spondylosis, cervical six-seven herniated nucleus pulposis, cervical seven radiculopathy  PROCEDURE:  Procedure(s): ANTERIOR CERVICAL DECOMPRESSION/DISCECTOMY FUSION 2 LEVELS  SURGEON:  Surgeon(s): [employer]  PHYSICIAN ASSISTANT:   ASSISTANTS: none   ANESTHESIA:   general  EBL:  Total I/O In: 1200 [I.V.:1200] Out: 75 [Blood:75]  BLOOD ADMINISTERED:none  DRAINS: none   LOCAL MEDICATIONS USED:  NONE  SPECIMEN:  No Specimen  DISPOSITION OF SPECIMEN:  N/A  COUNTS:  YES  TOURNIQUET:  * No tourniquets in log *  DICTATION: .Dragon Dictation  PLAN OF CARE: Admit to inpatient   PATIENT DISPOSITION:  PACU - hemodynamically stable.   Delay start of Pharmacological VTE agent (>24hrs) due to surgical blood loss or risk of bleeding:  yes

## 2011-07-19 MED ORDER — HYDROCODONE-ACETAMINOPHEN 5-325 MG PO TABS: 1.0000 | ORAL_TABLET | ORAL | Status: DC | PRN

## 2011-07-19 MED ORDER — CYCLOBENZAPRINE HCL 10 MG PO TABS: 10.0000 mg | ORAL_TABLET | Freq: Every evening | ORAL | Status: DC | PRN

## 2011-07-19 NOTE — Discharge Summary (Signed)
Physician Discharge Summary  Patient ID: Francisco [name] MRN: [id number] DOB/AGE: May 08, 1946 65 y.o.  Admit date: 07/18/2011 Discharge date: 07/19/2011  Admission Diagnoses:  Discharge Diagnoses:  Active Problems:  * No active hospital problems. *    Discharged Condition: good  Hospital Course: Patient admitted and underwent an uncomplicated 2 level anterior cervical decompression and fusion. Postoperatively the patient awakened with marked improvement of N. knee pain. At time of discharge wound is healing well patient has no motor or sensory complaints he is up and ambulatory and doing very well Consults: none  Significant Diagnostic Studies:     Discharge Exam: Blood pressure 128/62, pulse 69, temperature 98.5 F (36.9 C), resp. rate 18, SpO2 100.00%. General appearance: alert and cooperative Neck: no adenopathy, no carotid bruit, no JVD, supple, symmetrical, trachea midline and thyroid not enlarged, symmetric, no tenderness/mass/nodules Neurologic: Mental status: Alert, oriented, thought content appropriate Sensory: normal Motor: grossly normal Incision/Wound:Healing well  Disposition: Home   Current Discharge Medication List    START taking these medications   Details  HYDROcodone-acetaminophen (NORCO) 5-325 MG per tablet Take 1-2 tablets by mouth every 4 (four) hours as needed. Qty: 60 tablet, Refills: 1      CONTINUE these medications which have CHANGED   Details  cyclobenzaprine (FLEXERIL) 10 MG tablet Take 1 tablet (10 mg total) by mouth at bedtime as needed for muscle spasms. Qty: 30 tablet, Refills: 1      CONTINUE these medications which have NOT CHANGED   Details  Ascorbic Acid (VITAMIN C PO) Take 1 tablet by mouth daily.      cholecalciferol (VITAMIN D) 1000 UNITS tablet Take 1,000 Units by mouth daily.      gabapentin (NEURONTIN) 300 MG capsule Take 300 mg by mouth at bedtime.      levothyroxine (SYNTHROID, LEVOTHROID) 88 MCG tablet Take 88 mcg  by mouth daily.      lisinopril (PRINIVIL,ZESTRIL) 10 MG tablet Take 10 mg by mouth daily.      lovastatin (MEVACOR) 40 MG tablet Take 40 mg by mouth at bedtime.      meloxicam (MOBIC) 7.5 MG tablet Take 7.5 mg by mouth daily.      oxyCODONE-acetaminophen (PERCOCET) 10-325 MG per tablet Take 1 tablet by mouth every 4 (four) hours as needed. For pain          Signed: Anaih Brander A 07/19/2011, 7:52 AM

## 2011-07-19 NOTE — Progress Notes (Signed)
Orthopedic Tech Progress Note Patient Details:  Francisco [name] 09/12/1946 [id number]   soft collar   Cammer, Mickie Bail 07/19/2011, 8:40 AM

## 2011-07-22 ENCOUNTER — Emergency Department: Payer: Self-pay | Admitting: Emergency Medicine

## 2011-07-22 ENCOUNTER — Encounter (HOSPITAL_COMMUNITY): Payer: Self-pay | Admitting: Neurosurgery

## 2011-09-14 ENCOUNTER — Other Ambulatory Visit: Payer: Self-pay | Admitting: Neurosurgery

## 2011-09-14 ENCOUNTER — Ambulatory Visit
Admission: RE | Admit: 2011-09-14 | Discharge: 2011-09-14 | Disposition: A | Discharge status: Home / Self Care | Payer: 59 | Source: Ambulatory Visit | Attending: Neurosurgery | Admitting: Neurosurgery

## 2011-09-14 DIAGNOSIS — M542 Cervicalgia: Secondary | ICD-10-CM

## 2012-04-10 ENCOUNTER — Ambulatory Visit: Payer: Self-pay | Admitting: Urology

## 2012-04-10 LAB — CBC
HGB: 13.1 g/dL (ref 13.0–18.0)
MCH: 31.5 pg (ref 26.0–34.0)
MCHC: 34.2 g/dL (ref 32.0–36.0)
MCV: 92 fL (ref 80–100)
Platelet: 249 10*3/uL (ref 150–440)
RBC: 4.15 10*6/uL — ABNORMAL LOW (ref 4.40–5.90)
RDW: 12.9 % (ref 11.5–14.5)

## 2012-04-16 ENCOUNTER — Inpatient Hospital Stay: Payer: Self-pay | Admitting: Urology

## 2012-04-16 LAB — HEMATOCRIT: HCT: 32 % — ABNORMAL LOW (ref 40.0–52.0)

## 2012-04-17 LAB — CREATININE, SERUM
Creatinine: 1.33 mg/dL — ABNORMAL HIGH (ref 0.60–1.30)
EGFR (African American): >60
EGFR (Non-African Amer.): 55 — ABNORMAL LOW

## 2012-04-18 LAB — PATHOLOGY REPORT

## 2012-05-11 ENCOUNTER — Ambulatory Visit: Payer: Self-pay | Admitting: Ophthalmology

## 2012-06-28 ENCOUNTER — Ambulatory Visit: Payer: Self-pay | Admitting: Neurology

## 2012-10-12 ENCOUNTER — Ambulatory Visit: Payer: Self-pay | Admitting: Unknown Physician Specialty

## 2013-01-04 ENCOUNTER — Ambulatory Visit: Payer: Self-pay | Admitting: Physician Assistant

## 2013-04-12 HISTORY — PX: PROSTATECTOMY: SHX69

## 2013-10-11 ENCOUNTER — Other Ambulatory Visit: Payer: Self-pay | Admitting: Unknown Physician Specialty

## 2013-11-22 ENCOUNTER — Ambulatory Visit: Payer: Self-pay | Admitting: Unknown Physician Specialty

## 2013-11-22 LAB — CREATININE, SERUM
Creatinine: 1.29 mg/dL (ref 0.60–1.30)
EGFR (African American): >60
GFR CALC NON AF AMER: 56 — AB

## 2013-12-24 ENCOUNTER — Ambulatory Visit: Payer: Self-pay | Admitting: Internal Medicine

## 2014-01-22 DIAGNOSIS — J449 Chronic obstructive pulmonary disease, unspecified: Secondary | ICD-10-CM | POA: Insufficient documentation

## 2014-02-18 ENCOUNTER — Other Ambulatory Visit: Payer: Self-pay | Admitting: Neurosurgery

## 2014-02-19 ENCOUNTER — Encounter (HOSPITAL_COMMUNITY): Payer: Self-pay | Admitting: Pharmacy Technician

## 2014-02-19 NOTE — Pre-Procedure Instructions (Signed)
Francisco [name]  02/19/2014   Your procedure is scheduled on: Monday, February 24, 2014  Report to [place] Stay (use Main Entrance "A'') at 7:30 AM.  Call this number if you have problems the morning of surgery: [phone]   Remember:   Do not eat food or drink liquids after midnight.   Take these medicines the morning of surgery with A SIP OF WATER: SYNTHROID,   If needed: pain medication Stop taking Aspirin, vitamins and herbal medications. Do not take any NSAIDs ie: Ibuprofen, Advil, Naproxen or any medication containing Aspirin.  Do not wear jewelry.  Do not wear lotions, powders, or perfumes. You may wear deodorant.             Men may shave face and neck.  Do not bring valuables to the hospital.  [facility] is not responsible  for any belongings or valuables.               Contacts, dentures or bridgework may not be worn into surgery.  Leave suitcase in the car. After surgery it may be brought to your room.  For patients admitted to the hospital, discharge time is determined by your treatment team.               Patients discharged the day of surgery will not be allowed to drive home.  Name and phone number of your driver:   Special Instructions:  Special Instructions:Special Instructions: [facility] - Preparing for Surgery  Before surgery, you can play an important role.  Because skin is not sterile, your skin needs to be as free of germs as possible.  You can reduce the number of germs on you skin by washing with CHG (chlorahexidine gluconate) soap before surgery.  CHG is an antiseptic cleaner which kills germs and bonds with the skin to continue killing germs even after washing.  Please DO NOT use if you have an allergy to CHG or antibacterial soaps.  If your skin becomes reddened/irritated stop using the CHG and inform your nurse when you arrive at Short Stay.  Do not shave (including legs and underarms) for at least 48 hours prior to the first CHG shower.  You may  shave your face.  Please follow these instructions carefully:   1.  Shower with CHG Soap the night before surgery and the morning of Surgery.  2.  If you choose to wash your hair, wash your hair first as usual with your normal shampoo.  3.  After you shampoo, rinse your hair and body thoroughly to remove the Shampoo.  4.  Use CHG as you would any other liquid soap.  You can apply chg directly  to the skin and wash gently with scrungie or a clean washcloth.  5.  Apply the CHG Soap to your body ONLY FROM THE NECK DOWN.  Do not use on open wounds or open sores.  Avoid contact with your eyes, ears, mouth and genitals (private parts).  Wash genitals (private parts) with your normal soap.  6.  Wash thoroughly, paying special attention to the area where your surgery will be performed.  7.  Thoroughly rinse your body with warm water from the neck down.  8.  DO NOT shower/wash with your normal soap after using and rinsing off the CHG Soap.  9.  Pat yourself dry with a clean towel.            10.  Wear clean pajamas.  11.  Place clean sheets on your bed the night of your first shower and do not sleep with pets.  Day of Surgery  Do not apply any lotions the morning of surgery.  Please wear clean clothes to the hospital/surgery center.   Please read over the following fact sheets that you were given: Pain Booklet, Coughing and Deep Breathing, MRSA Information and Surgical Site Infection Prevention

## 2014-02-20 ENCOUNTER — Encounter (HOSPITAL_COMMUNITY)
Admission: RE | Admit: 2014-02-20 | Discharge: 2014-02-20 | Disposition: A | Discharge status: Home / Self Care | Payer: BC Managed Care – PPO | Source: Ambulatory Visit | Attending: Neurosurgery | Admitting: Neurosurgery

## 2014-02-20 ENCOUNTER — Encounter (HOSPITAL_COMMUNITY): Payer: Self-pay

## 2014-02-20 ENCOUNTER — Encounter (HOSPITAL_COMMUNITY)
Admission: RE | Admit: 2014-02-20 | Discharge: 2014-02-20 | Disposition: A | Discharge status: Home / Self Care | Payer: BC Managed Care – PPO | Source: Ambulatory Visit | Attending: Anesthesiology | Admitting: Anesthesiology

## 2014-02-20 DIAGNOSIS — Z01810 Encounter for preprocedural cardiovascular examination: Secondary | ICD-10-CM | POA: Insufficient documentation

## 2014-02-20 DIAGNOSIS — Z01812 Encounter for preprocedural laboratory examination: Secondary | ICD-10-CM | POA: Insufficient documentation

## 2014-02-20 HISTORY — DX: Malignant (primary) neoplasm, unspecified: C80.1

## 2014-02-20 HISTORY — DX: Unspecified chronic bronchitis: J42

## 2014-02-20 HISTORY — DX: Sleep apnea, unspecified: G47.30

## 2014-02-20 HISTORY — DX: Chronic obstructive pulmonary disease, unspecified: J44.9

## 2014-02-20 HISTORY — DX: Myasthenia gravis without (acute) exacerbation: G70.00

## 2014-02-20 HISTORY — DX: Shortness of breath: R06.02

## 2014-02-20 LAB — BASIC METABOLIC PANEL
BUN: 20 mg/dL (ref 6–23)
CHLORIDE: 103 meq/L (ref 96–112)
CO2: 27 meq/L (ref 19–32)
Calcium: 10 mg/dL (ref 8.4–10.5)
Creatinine, Ser: 1.05 mg/dL (ref 0.50–1.35)
GFR calc non Af Amer: 70 mL/min — ABNORMAL LOW (ref >90)
GFR, EST AFRICAN AMERICAN: 82 mL/min — AB (ref >90)
Glucose, Bld: 76 mg/dL (ref 70–99)
Potassium: 4.5 mEq/L (ref 3.7–5.3)
Sodium: 146 mEq/L (ref 137–147)

## 2014-02-20 LAB — CBC WITH DIFFERENTIAL/PLATELET
Basophils Absolute: 0 10*3/uL (ref 0.0–0.1)
Basophils Relative: 0 % (ref 0–1)
Eosinophils Absolute: 0.1 10*3/uL (ref 0.0–0.7)
Eosinophils Relative: 1 % (ref 0–5)
HCT: 36.7 % — ABNORMAL LOW (ref 39.0–52.0)
HEMOGLOBIN: 11.5 g/dL — AB (ref 13.0–17.0)
LYMPHS PCT: 32 % (ref 12–46)
Lymphs Abs: 3.4 10*3/uL (ref 0.7–4.0)
MCH: 30.7 pg (ref 26.0–34.0)
MCHC: 31.3 g/dL (ref 30.0–36.0)
MCV: 98.1 fL (ref 78.0–100.0)
MONO ABS: 1.4 10*3/uL — AB (ref 0.1–1.0)
Monocytes Relative: 13 % — ABNORMAL HIGH (ref 3–12)
NEUTROS ABS: 5.7 10*3/uL (ref 1.7–7.7)
Neutrophils Relative %: 54 % (ref 43–77)
Platelets: 251 10*3/uL (ref 150–400)
RBC: 3.74 MIL/uL — ABNORMAL LOW (ref 4.22–5.81)
RDW: 14 % (ref 11.5–15.5)
WBC: 10.7 10*3/uL — ABNORMAL HIGH (ref 4.0–10.5)

## 2014-02-20 LAB — SURGICAL PCR SCREEN
MRSA, PCR: POSITIVE — AB
Staphylococcus aureus: POSITIVE — AB

## 2014-02-20 NOTE — Progress Notes (Signed)
Francisco [name] at Dr Marchelle Folks made aware that patient's nasal swab was positive MRSA and staph. Patient was also informed, patient verbalized understanding of instructions and that script was called to his pharmacy at [phone].

## 2014-02-21 NOTE — Progress Notes (Signed)
Anesthesia Chart Review:  Patient is a 68 year old male scheduled for L3-4, L4-5, L5-S1 laminectomy on 02/24/14 by Dr. Annette Stable.  History includes former smoker, COPD/chronic bronchitis (pulmonologist Dr. Wallene Huh), HTN, carotid artery stenosis ("70%" bilateral ICA per patient; followed by Dr. Lucky Cowboy with [facility] every six months), OSA with inconsistent CPAP use, hypothyroidism, prostate cancer s/p prostatectomy '14 [facility]), Myasthenia Gravis (primary issue is diplopia) diagnosed ~ 2013 (Dr. Scheryl Marten; [facility]), back surgery '85, ACDF '12, left IHR '93. PCP is Dr. Ramonita Lab.   EKG on 02/20/14 showed NSR. He reported a history of normal heart cath approximately 10 years ago at [facility].  (No copy of cath at [facility] found per their medical records.)  CXR on 02/20/14 showed: No active disease. Mild hyperinflation.  Preoperative labs noted.   Records from Dr. Raul Del and Dr. Lucky Cowboy have already been requested, but are still pending.  He denies history of CVA.  His last carotid duplex was ~ 3 months ago with six month follow-up recommended.  Reportedly there are no plans for intervention at this point, unless > 80%. He breathing is at baseline.  He says his only symptoms with his Myasthenia is double vision, otherwise no known respiratory, facial, or muscle weakness. Based on currently available information available, I would anticipate that he could proceed as planned.  I can review additional records if received today, otherwise further review by his assigned anesthesiologist on the day of surgery.  George Hugh [facility] Short Stay Center/Anesthesiology Phone [phone] 02/21/2014 1:01 PM

## 2014-02-23 MED ORDER — CEFAZOLIN SODIUM-DEXTROSE 2-3 GM-% IV SOLR
2.0000 g | INTRAVENOUS | Status: AC
  Administered 2014-02-24: 2 g via INTRAVENOUS
  Filled 2014-02-23: qty 50

## 2014-02-24 ENCOUNTER — Inpatient Hospital Stay (HOSPITAL_COMMUNITY): Payer: BC Managed Care – PPO | Admitting: Certified Registered Nurse Anesthetist

## 2014-02-24 ENCOUNTER — Inpatient Hospital Stay (HOSPITAL_COMMUNITY): Payer: BC Managed Care – PPO

## 2014-02-24 ENCOUNTER — Encounter (HOSPITAL_COMMUNITY)
Admission: RE | Disposition: A | Discharge status: Home / Self Care | Payer: Self-pay | Source: Ambulatory Visit | Attending: Neurosurgery

## 2014-02-24 ENCOUNTER — Encounter (HOSPITAL_COMMUNITY): Payer: BC Managed Care – PPO | Admitting: Vascular Surgery

## 2014-02-24 ENCOUNTER — Encounter (HOSPITAL_COMMUNITY): Payer: Self-pay | Admitting: *Deleted

## 2014-02-24 ENCOUNTER — Ambulatory Visit (HOSPITAL_COMMUNITY)
Admission: RE | Admit: 2014-02-24 | Discharge: 2014-02-25 | Disposition: A | Discharge status: Home / Self Care | Payer: BC Managed Care – PPO | Source: Ambulatory Visit | Attending: Neurosurgery | Admitting: Neurosurgery

## 2014-02-24 DIAGNOSIS — Z87891 Personal history of nicotine dependence: Secondary | ICD-10-CM | POA: Diagnosis not present

## 2014-02-24 DIAGNOSIS — I10 Essential (primary) hypertension: Secondary | ICD-10-CM | POA: Diagnosis not present

## 2014-02-24 DIAGNOSIS — Z8546 Personal history of malignant neoplasm of prostate: Secondary | ICD-10-CM | POA: Diagnosis not present

## 2014-02-24 DIAGNOSIS — G4730 Sleep apnea, unspecified: Secondary | ICD-10-CM | POA: Insufficient documentation

## 2014-02-24 DIAGNOSIS — G9519 Other vascular myelopathies: Secondary | ICD-10-CM | POA: Diagnosis present

## 2014-02-24 DIAGNOSIS — M5126 Other intervertebral disc displacement, lumbar region: Secondary | ICD-10-CM | POA: Diagnosis present

## 2014-02-24 DIAGNOSIS — J449 Chronic obstructive pulmonary disease, unspecified: Secondary | ICD-10-CM | POA: Diagnosis not present

## 2014-02-24 DIAGNOSIS — J4489 Other specified chronic obstructive pulmonary disease: Secondary | ICD-10-CM | POA: Insufficient documentation

## 2014-02-24 DIAGNOSIS — E039 Hypothyroidism, unspecified: Secondary | ICD-10-CM | POA: Diagnosis not present

## 2014-02-24 DIAGNOSIS — M48062 Spinal stenosis, lumbar region with neurogenic claudication: Secondary | ICD-10-CM | POA: Diagnosis present

## 2014-02-24 DIAGNOSIS — Z6834 Body mass index (BMI) 34.0-34.9, adult: Secondary | ICD-10-CM | POA: Diagnosis not present

## 2014-02-24 DIAGNOSIS — M4807 Spinal stenosis, lumbosacral region: Secondary | ICD-10-CM | POA: Diagnosis present

## 2014-02-24 HISTORY — PX: LUMBAR LAMINECTOMY/DECOMPRESSION MICRODISCECTOMY: SHX5026

## 2014-02-24 SURGERY — LUMBAR LAMINECTOMY/DECOMPRESSION MICRODISCECTOMY 3 LEVELS
Anesthesia: General | Site: Back | Laterality: Left

## 2014-02-24 MED ORDER — SUCCINYLCHOLINE CHLORIDE 20 MG/ML IJ SOLN
INTRAMUSCULAR | Status: DC | PRN
  Administered 2014-02-24: 140 mg via INTRAVENOUS
  Administered 2014-02-24: 100 mg via INTRAVENOUS

## 2014-02-24 MED ORDER — OXYCODONE HCL 5 MG PO TABS: 5.0000 mg | ORAL_TABLET | Freq: Once | ORAL | Status: DC | PRN

## 2014-02-24 MED ORDER — FENTANYL CITRATE 0.05 MG/ML IJ SOLN
INTRAMUSCULAR | Status: DC | PRN
  Administered 2014-02-24: 50 ug via INTRAVENOUS
  Administered 2014-02-24: 100 ug via INTRAVENOUS

## 2014-02-24 MED ORDER — MONTELUKAST SODIUM 10 MG PO TABS
10.0000 mg | ORAL_TABLET | Freq: Every day | ORAL | Status: DC
  Administered 2014-02-24: 10 mg via ORAL
  Filled 2014-02-24 (×2): qty 1

## 2014-02-24 MED ORDER — HYDROCODONE-ACETAMINOPHEN 5-325 MG PO TABS
1.0000 | ORAL_TABLET | ORAL | Status: DC | PRN
  Administered 2014-02-24 – 2014-02-25 (×4): 2 via ORAL
  Filled 2014-02-24 (×4): qty 2

## 2014-02-24 MED ORDER — HYDROMORPHONE HCL PF 1 MG/ML IJ SOLN
INTRAMUSCULAR | Status: AC
  Filled 2014-02-24: qty 1

## 2014-02-24 MED ORDER — BUPIVACAINE HCL (PF) 0.25 % IJ SOLN
INTRAMUSCULAR | Status: DC | PRN
  Administered 2014-02-24: 20 mL

## 2014-02-24 MED ORDER — MIDAZOLAM HCL 5 MG/5ML IJ SOLN
INTRAMUSCULAR | Status: DC | PRN
  Administered 2014-02-24 (×2): 1 mg via INTRAVENOUS

## 2014-02-24 MED ORDER — ACETAMINOPHEN 325 MG PO TABS: 650.0000 mg | ORAL_TABLET | ORAL | Status: DC | PRN

## 2014-02-24 MED ORDER — ARTIFICIAL TEARS OP OINT
TOPICAL_OINTMENT | OPHTHALMIC | Status: DC | PRN
  Administered 2014-02-24: 1 via OPHTHALMIC

## 2014-02-24 MED ORDER — PHENOL 1.4 % MT LIQD: 1.0000 | OROMUCOSAL | Status: DC | PRN

## 2014-02-24 MED ORDER — CYCLOBENZAPRINE HCL 10 MG PO TABS
10.0000 mg | ORAL_TABLET | Freq: Three times a day (TID) | ORAL | Status: DC | PRN
  Administered 2014-02-25: 10 mg via ORAL
  Filled 2014-02-24: qty 1

## 2014-02-24 MED ORDER — ONDANSETRON HCL 4 MG/2ML IJ SOLN
INTRAMUSCULAR | Status: DC | PRN
  Administered 2014-02-24: 4 mg via INTRAVENOUS

## 2014-02-24 MED ORDER — PHENYLEPHRINE HCL 10 MG/ML IJ SOLN
INTRAMUSCULAR | Status: DC | PRN
  Administered 2014-02-24: 40 ug via INTRAVENOUS
  Administered 2014-02-24 (×2): 80 ug via INTRAVENOUS
  Administered 2014-02-24: 40 ug via INTRAVENOUS
  Administered 2014-02-24 (×2): 80 ug via INTRAVENOUS

## 2014-02-24 MED ORDER — LACTATED RINGERS IV SOLN
INTRAVENOUS | Status: DC
  Administered 2014-02-24: 09:00:00 via INTRAVENOUS

## 2014-02-24 MED ORDER — SODIUM CHLORIDE 0.9 % IJ SOLN
3.0000 mL | Freq: Two times a day (BID) | INTRAMUSCULAR | Status: DC
  Administered 2014-02-24: 3 mL via INTRAVENOUS

## 2014-02-24 MED ORDER — ONDANSETRON HCL 4 MG/2ML IJ SOLN: 4.0000 mg | INTRAMUSCULAR | Status: DC | PRN

## 2014-02-24 MED ORDER — HYDROMORPHONE HCL PF 1 MG/ML IJ SOLN: 0.5000 mg | INTRAMUSCULAR | Status: DC | PRN

## 2014-02-24 MED ORDER — OXYCODONE-ACETAMINOPHEN 5-325 MG PO TABS: 1.0000 | ORAL_TABLET | ORAL | Status: DC | PRN

## 2014-02-24 MED ORDER — LIDOCAINE HCL (CARDIAC) 20 MG/ML IV SOLN
INTRAVENOUS | Status: DC | PRN
  Administered 2014-02-24: 80 mg via INTRAVENOUS

## 2014-02-24 MED ORDER — THROMBIN 20000 UNITS EX SOLR
CUTANEOUS | Status: DC | PRN
  Administered 2014-02-24: 11:00:00 via TOPICAL

## 2014-02-24 MED ORDER — PHENYLEPHRINE HCL 10 MG/ML IJ SOLN
INTRAMUSCULAR | Status: AC
  Filled 2014-02-24: qty 1

## 2014-02-24 MED ORDER — MIDAZOLAM HCL 2 MG/2ML IJ SOLN
INTRAMUSCULAR | Status: AC
  Filled 2014-02-24: qty 2

## 2014-02-24 MED ORDER — BACITRACIN 50000 UNITS IM SOLR
INTRAMUSCULAR | Status: DC | PRN
  Administered 2014-02-24: 11:00:00

## 2014-02-24 MED ORDER — LACTATED RINGERS IV SOLN
INTRAVENOUS | Status: DC | PRN
  Administered 2014-02-24 (×2): via INTRAVENOUS

## 2014-02-24 MED ORDER — PREDNISONE 20 MG PO TABS
40.0000 mg | ORAL_TABLET | ORAL | Status: DC
  Administered 2014-02-25: 40 mg via ORAL
  Filled 2014-02-24: qty 2

## 2014-02-24 MED ORDER — DEXAMETHASONE SODIUM PHOSPHATE 10 MG/ML IJ SOLN
INTRAMUSCULAR | Status: AC
  Filled 2014-02-24: qty 1

## 2014-02-24 MED ORDER — HYDROMORPHONE HCL PF 1 MG/ML IJ SOLN
0.2500 mg | INTRAMUSCULAR | Status: DC | PRN
  Administered 2014-02-24 (×2): 0.5 mg via INTRAVENOUS

## 2014-02-24 MED ORDER — DEXAMETHASONE SODIUM PHOSPHATE 10 MG/ML IJ SOLN
10.0000 mg | INTRAMUSCULAR | Status: AC
  Administered 2014-02-24: 10 mg via INTRAVENOUS
  Filled 2014-02-24: qty 1

## 2014-02-24 MED ORDER — SENNA 8.6 MG PO TABS
1.0000 | ORAL_TABLET | Freq: Two times a day (BID) | ORAL | Status: DC
  Administered 2014-02-24 – 2014-02-25 (×2): 8.6 mg via ORAL
  Filled 2014-02-24 (×3): qty 1

## 2014-02-24 MED ORDER — PROPOFOL 10 MG/ML IV BOLUS
INTRAVENOUS | Status: DC | PRN
  Administered 2014-02-24: 200 mg via INTRAVENOUS

## 2014-02-24 MED ORDER — ALUM & MAG HYDROXIDE-SIMETH 200-200-20 MG/5ML PO SUSP: 30.0000 mL | Freq: Four times a day (QID) | ORAL | Status: DC | PRN

## 2014-02-24 MED ORDER — MUPIROCIN 2 % EX OINT
TOPICAL_OINTMENT | Freq: Two times a day (BID) | CUTANEOUS | Status: AC
  Administered 2014-02-24 – 2014-02-25 (×2): via NASAL
  Filled 2014-02-24: qty 22

## 2014-02-24 MED ORDER — PROPOFOL 10 MG/ML IV BOLUS
INTRAVENOUS | Status: AC
  Filled 2014-02-24: qty 20

## 2014-02-24 MED ORDER — OXYCODONE HCL 5 MG/5ML PO SOLN: 5.0000 mg | Freq: Once | ORAL | Status: DC | PRN

## 2014-02-24 MED ORDER — PHENYLEPHRINE HCL 10 MG/ML IJ SOLN
10.0000 mg | INTRAVENOUS | Status: DC | PRN
  Administered 2014-02-24: 20 ug/min via INTRAVENOUS

## 2014-02-24 MED ORDER — SODIUM CHLORIDE 0.9 % IJ SOLN: 3.0000 mL | INTRAMUSCULAR | Status: DC | PRN

## 2014-02-24 MED ORDER — PYRIDOSTIGMINE BROMIDE 60 MG PO TABS
120.0000 mg | ORAL_TABLET | Freq: Every day | ORAL | Status: DC
  Administered 2014-02-25: 120 mg via ORAL
  Filled 2014-02-24 (×2): qty 2

## 2014-02-24 MED ORDER — 0.9 % SODIUM CHLORIDE (POUR BTL) OPTIME
TOPICAL | Status: DC | PRN
  Administered 2014-02-24: 1000 mL

## 2014-02-24 MED ORDER — LOSARTAN POTASSIUM 50 MG PO TABS
50.0000 mg | ORAL_TABLET | Freq: Every day | ORAL | Status: DC
  Administered 2014-02-24 – 2014-02-25 (×2): 50 mg via ORAL
  Filled 2014-02-24 (×2): qty 1

## 2014-02-24 MED ORDER — LIDOCAINE HCL (CARDIAC) 20 MG/ML IV SOLN
INTRAVENOUS | Status: AC
  Filled 2014-02-24: qty 5

## 2014-02-24 MED ORDER — CEFAZOLIN SODIUM 1-5 GM-% IV SOLN
1.0000 g | Freq: Three times a day (TID) | INTRAVENOUS | Status: AC
  Administered 2014-02-24 (×2): 1 g via INTRAVENOUS
  Filled 2014-02-24 (×2): qty 50

## 2014-02-24 MED ORDER — ARTIFICIAL TEARS OP OINT
TOPICAL_OINTMENT | OPHTHALMIC | Status: AC
  Filled 2014-02-24: qty 3.5

## 2014-02-24 MED ORDER — ALBUMIN HUMAN 5 % IV SOLN
INTRAVENOUS | Status: DC | PRN
  Administered 2014-02-24: 11:00:00 via INTRAVENOUS

## 2014-02-24 MED ORDER — LEVOTHYROXINE SODIUM 88 MCG PO TABS
88.0000 ug | ORAL_TABLET | Freq: Every day | ORAL | Status: DC
  Administered 2014-02-25: 88 ug via ORAL
  Filled 2014-02-24 (×2): qty 1

## 2014-02-24 MED ORDER — ONDANSETRON HCL 4 MG/2ML IJ SOLN: 4.0000 mg | Freq: Four times a day (QID) | INTRAMUSCULAR | Status: DC | PRN

## 2014-02-24 MED ORDER — FENTANYL CITRATE 0.05 MG/ML IJ SOLN
INTRAMUSCULAR | Status: AC
Start: 2014-02-24 — End: 2014-02-24
  Filled 2014-02-24: qty 5

## 2014-02-24 MED ORDER — MENTHOL 3 MG MT LOZG: 1.0000 | LOZENGE | OROMUCOSAL | Status: DC | PRN

## 2014-02-24 MED ORDER — FLUTICASONE FUROATE-VILANTEROL 100-25 MCG/INH IN AEPB: 1.0000 | INHALATION_SPRAY | Freq: Every day | RESPIRATORY_TRACT | Status: DC

## 2014-02-24 MED ORDER — ACETAMINOPHEN 650 MG RE SUPP: 650.0000 mg | RECTAL | Status: DC | PRN

## 2014-02-24 SURGICAL SUPPLY — 54 items
BAG DECANTER FOR FLEXI CONT (MISCELLANEOUS) ×2 IMPLANT
BENZOIN TINCTURE PRP APPL 2/3 (GAUZE/BANDAGES/DRESSINGS) ×2 IMPLANT
BLADE 10 SAFETY STRL DISP (BLADE) ×2 IMPLANT
BLADE SURG ROTATE 9660 (MISCELLANEOUS) IMPLANT
BRUSH SCRUB EZ PLAIN DRY (MISCELLANEOUS) ×2 IMPLANT
BUR CUTTER 7.0 ROUND (BURR) ×2 IMPLANT
CANISTER SUCT 3000ML (MISCELLANEOUS) ×2 IMPLANT
CONT SPEC 4OZ CLIKSEAL STRL BL (MISCELLANEOUS) ×2 IMPLANT
DECANTER SPIKE VIAL GLASS SM (MISCELLANEOUS) ×2 IMPLANT
DERMABOND ADVANCED (GAUZE/BANDAGES/DRESSINGS) ×1
DERMABOND ADVANCED .7 DNX12 (GAUZE/BANDAGES/DRESSINGS) ×1 IMPLANT
DRAPE LAPAROTOMY 100X72X124 (DRAPES) ×2 IMPLANT
DRAPE MICROSCOPE ZEISS OPMI (DRAPES) ×2 IMPLANT
DRAPE POUCH INSTRU U-SHP 10X18 (DRAPES) ×2 IMPLANT
DRAPE PROXIMA HALF (DRAPES) IMPLANT
DRAPE SURG 17X23 STRL (DRAPES) ×4 IMPLANT
DURAPREP 26ML APPLICATOR (WOUND CARE) ×2 IMPLANT
ELECT REM PT RETURN 9FT ADLT (ELECTROSURGICAL) ×2
ELECTRODE REM PT RTRN 9FT ADLT (ELECTROSURGICAL) ×1 IMPLANT
GAUZE SPONGE 4X4 16PLY XRAY LF (GAUZE/BANDAGES/DRESSINGS) IMPLANT
GLOVE BIO SURGEON STRL SZ8 (GLOVE) ×2 IMPLANT
GLOVE BIOGEL PI IND STRL 8 (GLOVE) ×1 IMPLANT
GLOVE BIOGEL PI INDICATOR 8 (GLOVE) ×1
GLOVE ECLIPSE 7.5 STRL STRAW (GLOVE) ×4 IMPLANT
GLOVE ECLIPSE 8.5 STRL (GLOVE) ×4 IMPLANT
GLOVE ECLIPSE 9.0 STRL (GLOVE) IMPLANT
GLOVE EXAM NITRILE LRG STRL (GLOVE) IMPLANT
GLOVE EXAM NITRILE MD LF STRL (GLOVE) IMPLANT
GLOVE EXAM NITRILE XL STR (GLOVE) IMPLANT
GLOVE EXAM NITRILE XS STR PU (GLOVE) IMPLANT
GLOVE INDICATOR 8.5 STRL (GLOVE) ×2 IMPLANT
GOWN STRL REUS W/ TWL LRG LVL3 (GOWN DISPOSABLE) IMPLANT
GOWN STRL REUS W/ TWL XL LVL3 (GOWN DISPOSABLE) ×2 IMPLANT
GOWN STRL REUS W/TWL 2XL LVL3 (GOWN DISPOSABLE) ×2 IMPLANT
GOWN STRL REUS W/TWL LRG LVL3 (GOWN DISPOSABLE)
GOWN STRL REUS W/TWL XL LVL3 (GOWN DISPOSABLE) ×2
KIT BASIN OR (CUSTOM PROCEDURE TRAY) ×2 IMPLANT
KIT ROOM TURNOVER OR (KITS) ×2 IMPLANT
NEEDLE HYPO 22GX1.5 SAFETY (NEEDLE) ×2 IMPLANT
NEEDLE SPNL 22GX3.5 QUINCKE BK (NEEDLE) ×2 IMPLANT
NS IRRIG 1000ML POUR BTL (IV SOLUTION) ×2 IMPLANT
PACK LAMINECTOMY NEURO (CUSTOM PROCEDURE TRAY) ×2 IMPLANT
PAD ARMBOARD 7.5X6 YLW CONV (MISCELLANEOUS) ×6 IMPLANT
RUBBERBAND STERILE (MISCELLANEOUS) ×4 IMPLANT
SPONGE GAUZE 4X4 12PLY (GAUZE/BANDAGES/DRESSINGS) ×2 IMPLANT
SPONGE SURGIFOAM ABS GEL SZ50 (HEMOSTASIS) ×2 IMPLANT
STRIP CLOSURE SKIN 1/2X4 (GAUZE/BANDAGES/DRESSINGS) ×2 IMPLANT
SUT VIC AB 2-0 CT1 18 (SUTURE) ×4 IMPLANT
SUT VIC AB 3-0 SH 8-18 (SUTURE) ×2 IMPLANT
SYR 20ML ECCENTRIC (SYRINGE) ×2 IMPLANT
TAPE CLOTH SURG 4X10 WHT LF (GAUZE/BANDAGES/DRESSINGS) ×2 IMPLANT
TOWEL OR 17X24 6PK STRL BLUE (TOWEL DISPOSABLE) ×2 IMPLANT
TOWEL OR 17X26 10 PK STRL BLUE (TOWEL DISPOSABLE) ×2 IMPLANT
WATER STERILE IRR 1000ML POUR (IV SOLUTION) ×2 IMPLANT

## 2014-02-24 NOTE — Transfer of Care (Signed)
Immediate Anesthesia Transfer of Care Note  Patient: Francisco [name]  Procedure(s) Performed: Procedure(s) with comments: LUMBAR LAMINECTOMY/DECOMPRESSION MICRODISCECTOMY LUMBAR THREE-FOUR, FOUR-FIVE, LEFT FIVE-SACRAL ONE  (Left) - LUMBAR LAMINECTOMY/DECOMPRESSION MICRODISCECTOMY LUMBAR THREE-FOUR, FOUR-FIVE, LEFT FIVE-SACRAL ONE   Patient Location: PACU  Anesthesia Type:General  Level of Consciousness: awake, alert  and oriented  Airway & Oxygen Therapy: Patient Spontanous Breathing  Post-op Assessment: Report given to PACU RN  Post vital signs: Reviewed and stable  Complications: No apparent anesthesia complications

## 2014-02-24 NOTE — Plan of Care (Signed)
Problem: Consults Goal: Diagnosis - Spinal Surgery Outcome: Completed/Met Date Met:  02/24/14 Lumbar Laminectomy (Complex)

## 2014-02-24 NOTE — Op Note (Signed)
Date of procedure: 02/24/2014  Date of dictation: Same  Service: Neurosurgery  Preoperative diagnosis: L3-4, L4-5, L5-S1 stenosis with left L5-S1 recurrent herniated nuclear pulposus  Postoperative diagnosis: Same  Procedure Name: L3-4, L4-5, L5-S1 decompressive laminectomy with bilateral L3, L4, L5, S1 decompressive foraminotomies. Left L5-S1 redo microdiscectomy  Surgeon:Keilee Denman A.Oviya Ammar, M.D.  Asst. Surgeon: Saintclair Halsted  Anesthesia: General  Indication: 68 year old male with back and bilateral lower extremity symptoms left greater than right consistent with neurogenic claudication with strong elements a left-sided L5 radiculopathy. Workup demonstrates evidence of marked lumbar stenosis at L3-4 L4-5 with stenosis at L5-S1 and a left-sided L5-S1 recurrent disc protrusion into the left L5 foramen causing marked compression the left L5 nerve root. Patient's failed conservative management and presents now for decompressive laminectomy and left-sided L5-S1 redo microdiscectomy.  Operative note: After induction anesthesia, patient positioned prone onto Wilson frame and appropriately padded. Lumbar region prepped and draped. Incision made from L3-S1. Subperiosteal section of forms bilaterally. Retractor placed. X-ray taken. Level confirmed. Decompressive laminectomy then performed using Leksell rongeurs Kerrison or his high-speed drill to remove the entire lamina of L3 the entire lamina of L4 entire lamina of L5 and superior aspect of lamina of S1. Partial medial facetectomies performed at all levels. Ligament flavum elevated and resected piecemeal fashion. Gutters undercut. Foraminotomies completed along the course exiting L3 L4-L5 and S1 nerve roots bilaterally. The cortex was then placed in the L5 foramen on the left side. Facetectomy was widened somewhat. Microscope was used for microdissection of the spinal canal. Recurrent disc herniation was dissected free and removed. Foraminotomies were then performed  along the course exiting L5 nerve root. This went very thorough decompression achieved. There is no his injury to thecal sac and nerve roots. Wound is an area that like solution. Gelfoam was placed topically for hemostasis. Wounds and closed in typical fashion. Steri-Strips sterile dressing were applied. No apparent competitions. Patient tolerated the procedure well. He returns to the recovery room in good condition.

## 2014-02-24 NOTE — H&P (Signed)
Francisco [name] is an 68 y.o. male.   Chief Complaint: Back and left leg pain HPI: 68 year old male with progressive bilateral lower extremity symptoms left much greater than right. Symptoms are consistent with neurogenic claudication. Workup demonstrates evidence of marked multilevel disc degeneration with severe stenosis at L3-4 and L4-5. Patient status post previous L5-S1 laminotomy and microdiscectomy remotely in the past. He has evidence of a recurrent foraminal disc protrusion on the left side causing left-sided L5 nerve root compression as well. Patient's failed conservative management and presents now for decompressive surgery.  Past Medical History  Diagnosis Date  . Hypothyroidism     pt takes Levothyroxine daily  . Hypertension     doesn't have a cardiologist;pt is maintained by medical md for htn;requ[facility] ekg[facility]cx[facility] from [facility]  . Sleep apnea     do not use CPAP every night  . COPD (chronic obstructive pulmonary disease)   . Bronchitis, chronic   . Cancer     Prostate cancr  . Shortness of breath     Lung MD- Dr Darlin Coco  . Myasthenia gravis, adult form     Past Surgical History  Procedure Laterality Date  . Cardiac catheterization      2005 at [facility]  . Anterior cervical decomp/discectomy fusion  07/18/2011    Procedure: ANTERIOR CERVICAL DECOMPRESSION/DISCECTOMY FUSION 2 LEVELS;  Surgeon: Cooper Render Ascencion Stegner;  Location: [place] NEURO ORS;  Service: Neurosurgery;  Laterality: N/A;  cervical five-six, cervical six-seven anterior cervical discectomy and fusion  . Back surgery      in [place] repair Left     inguinal hernia repair in 1997  . Prostatectomy  8/14    [place] Dr Mare Ferrari     History reviewed. No pertinent family history. Social History:  reports that he has quit smoking. His smoking use included Cigarettes. He has a 30 pack-year smoking history. He has never used smokeless tobacco. He reports that he drinks alcohol. He reports that he does not  use illicit drugs.  Allergies:  Allergies  Allergen Reactions  . Codeine Nausea And Vomiting    Medications Prior to Admission  Medication Sig Dispense Refill  . acetaminophen (TYLENOL) 500 MG tablet Take 1,000 mg by mouth every 6 (six) hours as needed.      . bisacodyl (DULCOLAX) 5 MG EC tablet Take 5 mg by mouth daily.      . Fluticasone Furoate-Vilanterol (BREO ELLIPTA) 100-25 MCG/INH AEPB Inhale 1 Inhaler into the lungs daily.      Marland Kitchen levothyroxine (SYNTHROID, LEVOTHROID) 88 MCG tablet Take 88 mcg by mouth daily.        Marland Kitchen losartan (COZAAR) 50 MG tablet Take 50 mg by mouth daily.      . montelukast (SINGULAIR) 10 MG tablet Take 10 mg by mouth at bedtime.      . Multiple Vitamins-Minerals (CENTRUM SILVER PO) Take 1 tablet by mouth daily.      . predniSONE (DELTASONE) 20 MG tablet Take 40 mg by mouth every other day.      . pyridostigmine (MESTINON) 60 MG tablet Take 120 mg by mouth daily.        No results found for this or any previous visit (from the past 48 hour(s)). No results found.  Review of Systems  Constitutional: Negative.   HENT: Negative.   Eyes: Negative.   Respiratory: Negative.   Cardiovascular: Negative.   Gastrointestinal: Negative.   Genitourinary: Negative.   Musculoskeletal: Negative.   Skin:  Negative.   Neurological: Negative.   Endo/Heme/Allergies: Negative.   Psychiatric/Behavioral: Negative.     Blood pressure 172/76, pulse 76, temperature 98.2 F (36.8 C), temperature source Oral, resp. rate 18, weight 108.098 kg (238 lb 5 oz), SpO2 99.00%. Physical Exam  Constitutional: He is oriented to person, place, and time. He appears well-developed and well-nourished. No distress.  HENT:  Head: Normocephalic and atraumatic.  Right Ear: External ear normal.  Left Ear: External ear normal.  Nose: Nose normal.  Mouth/Throat: Oropharynx is clear and moist. No oropharyngeal exudate.  Eyes: Conjunctivae and EOM are normal. Pupils are equal, round, and  reactive to light. Right eye exhibits no discharge. Left eye exhibits no discharge.  Neck: Normal range of motion. Neck supple. No tracheal deviation present. No thyromegaly present.  Cardiovascular: Normal rate, regular rhythm, normal heart sounds and intact distal pulses.  Exam reveals no friction rub.   No murmur heard. Respiratory: Effort normal and breath sounds normal. No respiratory distress. He has no wheezes.  GI: Soft. Bowel sounds are normal. He exhibits no distension. There is no tenderness.  Musculoskeletal: Normal range of motion. He exhibits no edema and no tenderness.  Neurological: He is alert and oriented to person, place, and time. He has normal reflexes. No cranial nerve deficit. Coordination normal.  Skin: Skin is warm and dry. No rash noted. He is not diaphoretic. No erythema.  Psychiatric: He has a normal mood and affect. His behavior is normal. Judgment and thought content normal.     Assessment/Plan L3-4, L4-5 stenosis with neurogenic claudication left L5-S1 stenosis with recurrent foraminal herniated pulposis and radiculopathy or plan L3-4 and L4-5 decompressive laminectomy and foraminotomies and left L 5 S1 redo laminotomy and microdiscectomy. Risks and benefits been explained. Patient wishes to proceed.  Ry Moody A 02/24/2014, 9:21 AM

## 2014-02-24 NOTE — Anesthesia Postprocedure Evaluation (Signed)
Anesthesia Post Note  Patient: Francisco [name]  Procedure(s) Performed: Procedure(s) (LRB): LUMBAR LAMINECTOMY/DECOMPRESSION MICRODISCECTOMY LUMBAR THREE-FOUR, FOUR-FIVE, LEFT FIVE-SACRAL ONE  (Left)  Anesthesia type: General  Patient location: PACU  Post pain: Pain level controlled and Adequate analgesia  Post assessment: Post-op Vital signs reviewed, Patient's Cardiovascular Status Stable, Respiratory Function Stable, Patent Airway and Pain level controlled  Last Vitals:  Filed Vitals:   02/24/14 1255  BP:   Pulse:   Temp: 36.7 C  Resp:     Post vital signs: Reviewed and stable  Level of consciousness: awake, alert  and oriented  Complications: No apparent anesthesia complications

## 2014-02-24 NOTE — Brief Op Note (Signed)
02/24/2014  12:37 PM  PATIENT:  Carol Ada  68 y.o. male  PRE-OPERATIVE DIAGNOSIS:  Stenosis  POST-OPERATIVE DIAGNOSIS:  Stenosis  PROCEDURE:  Procedure(s) with comments: LUMBAR LAMINECTOMY/DECOMPRESSION MICRODISCECTOMY LUMBAR THREE-FOUR, FOUR-FIVE, LEFT FIVE-SACRAL ONE  (Left) - LUMBAR LAMINECTOMY/DECOMPRESSION MICRODISCECTOMY LUMBAR THREE-FOUR, FOUR-FIVE, LEFT FIVE-SACRAL ONE   SURGEON:  Surgeon(s) and Role:    * Charlie Pitter, MD - Primary    * Elaina Hoops, MD - Assisting  PHYSICIAN ASSISTANT:   ASSISTANTS:    ANESTHESIA:   general  EBL:  Total I/O In: 1750 [I.V.:1000; IV Piggyback:750] Out: 900 [Blood:900]  BLOOD ADMINISTERED:none  DRAINS: none   LOCAL MEDICATIONS USED:  MARCAINE     SPECIMEN:  No Specimen  DISPOSITION OF SPECIMEN:  N/A  COUNTS:  YES  TOURNIQUET:  * No tourniquets in log *  DICTATION: .Dragon Dictation  PLAN OF CARE: Admit for overnight observation  PATIENT DISPOSITION:  PACU - hemodynamically stable.   Delay start of Pharmacological VTE agent (>24hrs) due to surgical blood loss or risk of bleeding: yes

## 2014-02-24 NOTE — Anesthesia Procedure Notes (Signed)
Procedure Name: Intubation Date/Time: 02/24/2014 10:01 AM Performed by: Octavio Graves Pre-anesthesia Checklist: Patient identified, Timeout performed, Emergency Drugs available, Suction available and Patient being monitored Patient Re-evaluated:Patient Re-evaluated prior to inductionOxygen Delivery Method: Circle system utilized Preoxygenation: Pre-oxygenation with 100% oxygen Intubation Type: IV induction Ventilation: Mask ventilation without difficulty Grade View: Grade I Tube type: Oral Number of attempts: 1 Airway Equipment and Method: Lighted stylet and Video-laryngoscopy Placement Confirmation: breath sounds checked- equal and bilateral,  ETT inserted through vocal cords under direct vision and positive ETCO2 Secured at: 22 cm Tube secured with: Tape Dental Injury: Teeth and Oropharynx as per pre-operative assessment  Comments: IV induction Hodierene- intubation AM CRNA with Glidescope to keep neck in neutral alignment- neck neutral- atraumatic- teeth and mouth as preop- bilat BS Hodierene

## 2014-02-24 NOTE — Anesthesia Preprocedure Evaluation (Addendum)
Anesthesia Evaluation  Patient identified by MRN, date of birth, ID band Patient awake    Reviewed: Allergy & Precautions, H&P , NPO status , Patient's Chart, lab work & pertinent test results  Airway Mallampati: II TM Distance: >3 FB Neck ROM: full    Dental  (+) Dental Advisory Given, Teeth Intact   Pulmonary shortness of breath, sleep apnea , COPDformer smoker,          Cardiovascular hypertension, Pt. on medications     Neuro/Psych    GI/Hepatic   Endo/Other  Hypothyroidism Morbid obesity  Renal/GU      Musculoskeletal   Abdominal   Peds  Hematology   Anesthesia Other Findings   Reproductive/Obstetrics                         Anesthesia Physical Anesthesia Plan  ASA: III  Anesthesia Plan: General   Post-op Pain Management:    Induction: Intravenous  Airway Management Planned: Oral ETT and Video Laryngoscope Planned  Additional Equipment:   Intra-op Plan:   Post-operative Plan: Extubation in OR  Informed Consent: I have reviewed the patients History and Physical, chart, labs and discussed the procedure including the risks, benefits and alternatives for the proposed anesthesia with the patient or authorized representative who has indicated his/her understanding and acceptance.   Dental advisory given  Plan Discussed with: CRNA, Anesthesiologist and Surgeon  Anesthesia Plan Comments:        Anesthesia Quick Evaluation

## 2014-02-25 DIAGNOSIS — M5126 Other intervertebral disc displacement, lumbar region: Secondary | ICD-10-CM | POA: Diagnosis not present

## 2014-02-25 LAB — POCT I-STAT 4, (NA,K, GLUC, HGB,HCT)
GLUCOSE: 108 mg/dL — AB (ref 70–99)
HEMATOCRIT: 29 % — AB (ref 39.0–52.0)
Hemoglobin: 9.9 g/dL — ABNORMAL LOW (ref 13.0–17.0)
Potassium: 3.9 mEq/L (ref 3.7–5.3)
Sodium: 140 mEq/L (ref 137–147)

## 2014-02-25 MED ORDER — BUDESONIDE-FORMOTEROL FUMARATE 160-4.5 MCG/ACT IN AERO
2.0000 | INHALATION_SPRAY | Freq: Two times a day (BID) | RESPIRATORY_TRACT | Status: DC
  Filled 2014-02-25: qty 6

## 2014-02-25 MED ORDER — HYDROCODONE-ACETAMINOPHEN 10-325 MG PO TABS: 1.0000 | ORAL_TABLET | ORAL | Status: DC | PRN

## 2014-02-25 MED ORDER — CYCLOBENZAPRINE HCL 10 MG PO TABS: 10.0000 mg | ORAL_TABLET | Freq: Three times a day (TID) | ORAL | Status: DC | PRN

## 2014-02-25 NOTE — Discharge Instructions (Signed)

## 2014-02-25 NOTE — Progress Notes (Signed)
Pt d/c to home by car with family. Assessment stable. Prescriptions given. 

## 2014-02-25 NOTE — Discharge Summary (Signed)
Physician Discharge Summary  Patient ID: Francisco [name] MRN: [id number] DOB/AGE: February 28, 1946 68 y.o.  Admit date: 02/24/2014 Discharge date: 02/25/2014  Admission Diagnoses:  Discharge Diagnoses:  Principal Problem:   Spinal stenosis, lumbar region, with neurogenic claudication Active Problems:   Lumbosacral stenosis with neurogenic claudication   Discharged Condition: good  Hospital Course: Patient admitted to the hospital where he underwent uncomplicated multilevel lumbar decompression. Postoperatively he is doing very well. He is having some incisional soreness. He is having no lower extremity pain. His strength and sensation are intact. He is voiding well. He started for discharge home.  Consults:   Significant Diagnostic Studies:   Treatments:   Discharge Exam: Blood pressure 111/66, pulse 85, temperature 98.5 F (36.9 C), temperature source Oral, resp. rate 20, height 5\' 10"  (1.778 m), weight 108 kg (238 lb 1.6 oz), SpO2 96.00%. Awake and alert. Oriented and appropriate. Motor and sensory function intact. Wound clean and dry. Chest and abdomen benign.  Disposition: 01-Home or Self Care     Medication List         acetaminophen 500 MG tablet  Commonly known as:  TYLENOL  Take 1,000 mg by mouth every 6 (six) hours as needed.     bisacodyl 5 MG EC tablet  Commonly known as:  DULCOLAX  Take 5 mg by mouth daily.     BREO ELLIPTA 100-25 MCG/INH Aepb  Generic drug:  Fluticasone Furoate-Vilanterol  Inhale 1 Inhaler into the lungs daily.     CENTRUM SILVER PO  Take 1 tablet by mouth daily.     cyclobenzaprine 10 MG tablet  Commonly known as:  FLEXERIL  Take 1 tablet (10 mg total) by mouth 3 (three) times daily as needed for muscle spasms.     HYDROcodone-acetaminophen 10-325 MG per tablet  Commonly known as:  NORCO  Take 1-2 tablets by mouth every 4 (four) hours as needed for moderate pain.     levothyroxine 88 MCG tablet  Commonly known as:  SYNTHROID,  LEVOTHROID  Take 88 mcg by mouth daily.     losartan 50 MG tablet  Commonly known as:  COZAAR  Take 50 mg by mouth daily.     montelukast 10 MG tablet  Commonly known as:  SINGULAIR  Take 10 mg by mouth at bedtime.     predniSONE 20 MG tablet  Commonly known as:  DELTASONE  Take 40 mg by mouth every other day.     pyridostigmine 60 MG tablet  Commonly known as:  MESTINON  Take 120 mg by mouth daily.         Signed: POOL,HENRY A 02/25/2014, 9:44 AM

## 2014-03-02 ENCOUNTER — Encounter (HOSPITAL_COMMUNITY): Payer: Self-pay | Admitting: Neurosurgery

## 2014-03-02 ENCOUNTER — Observation Stay: Payer: Self-pay | Admitting: Internal Medicine

## 2014-03-02 LAB — CK TOTAL AND CKMB (NOT AT ARMC)
CK, TOTAL: 106 U/L
CK, TOTAL: 106 U/L
CK, Total: 99 U/L
CK-MB: 1.7 ng/mL (ref 0.5–3.6)
CK-MB: 1.7 ng/mL (ref 0.5–3.6)
CK-MB: 2 ng/mL (ref 0.5–3.6)

## 2014-03-02 LAB — BASIC METABOLIC PANEL
Anion Gap: 6 — ABNORMAL LOW (ref 7–16)
BUN: 27 mg/dL — AB (ref 7–18)
CHLORIDE: 110 mmol/L — AB (ref 98–107)
CO2: 26 mmol/L (ref 21–32)
Calcium, Total: 8.9 mg/dL (ref 8.5–10.1)
Creatinine: 1.2 mg/dL (ref 0.60–1.30)
EGFR (African American): >60
Glucose: 99 mg/dL (ref 65–99)
OSMOLALITY: 288 (ref 275–301)
Potassium: 3.9 mmol/L (ref 3.5–5.1)
Sodium: 142 mmol/L (ref 136–145)

## 2014-03-02 LAB — CBC
HCT: 26.3 % — ABNORMAL LOW (ref 40.0–52.0)
HGB: 8.8 g/dL — AB (ref 13.0–18.0)
MCH: 32 pg (ref 26.0–34.0)
MCHC: 33.4 g/dL (ref 32.0–36.0)
MCV: 96 fL (ref 80–100)
Platelet: 320 10*3/uL (ref 150–440)
RBC: 2.74 10*6/uL — AB (ref 4.40–5.90)
RDW: 13.7 % (ref 11.5–14.5)
WBC: 9.4 10*3/uL (ref 3.8–10.6)

## 2014-03-02 LAB — PRO B NATRIURETIC PEPTIDE: B-Type Natriuretic Peptide: 693 pg/mL — ABNORMAL HIGH (ref 0–125)

## 2014-03-02 LAB — TROPONIN I
TROPONIN-I: 0.04 ng/mL
TROPONIN-I: 0.03 ng/mL
Troponin-I: 0.04 ng/mL

## 2014-03-03 LAB — BASIC METABOLIC PANEL
Anion Gap: 6 — ABNORMAL LOW (ref 7–16)
BUN: 23 mg/dL — AB (ref 7–18)
Calcium, Total: 9.2 mg/dL (ref 8.5–10.1)
Chloride: 104 mmol/L (ref 98–107)
Co2: 30 mmol/L (ref 21–32)
Creatinine: 1.14 mg/dL (ref 0.60–1.30)
GLUCOSE: 90 mg/dL (ref 65–99)
Osmolality: 283 (ref 275–301)
Potassium: 3.5 mmol/L (ref 3.5–5.1)
SODIUM: 140 mmol/L (ref 136–145)

## 2014-03-03 LAB — IRON AND TIBC
IRON BIND. CAP.(TOTAL): 284 ug/dL (ref 250–450)
IRON SATURATION: 12 %
IRON: 33 ug/dL — AB (ref 65–175)
Unbound Iron-Bind.Cap.: 251 ug/dL

## 2014-03-03 LAB — CBC WITH DIFFERENTIAL/PLATELET
Basophil #: 0 10*3/uL (ref 0.0–0.1)
Basophil %: 0.4 %
EOS ABS: 0.3 10*3/uL (ref 0.0–0.7)
Eosinophil %: 2.8 %
HCT: 29.3 % — AB (ref 40.0–52.0)
HGB: 9.2 g/dL — AB (ref 13.0–18.0)
LYMPHS ABS: 2.6 10*3/uL (ref 1.0–3.6)
Lymphocyte %: 28 %
MCH: 30.2 pg (ref 26.0–34.0)
MCHC: 31.5 g/dL — ABNORMAL LOW (ref 32.0–36.0)
MCV: 96 fL (ref 80–100)
MONO ABS: 1.1 x10 3/mm — AB (ref 0.2–1.0)
Monocyte %: 11.9 %
NEUTROS PCT: 56.9 %
Neutrophil #: 5.3 10*3/uL (ref 1.4–6.5)
PLATELETS: 360 10*3/uL (ref 150–440)
RBC: 3.06 10*6/uL — ABNORMAL LOW (ref 4.40–5.90)
RDW: 14.1 % (ref 11.5–14.5)
WBC: 9.3 10*3/uL (ref 3.8–10.6)

## 2014-03-03 LAB — OCCULT BLOOD X 1 CARD TO LAB, STOOL: Occult Blood, Feces: POSITIVE

## 2014-03-04 LAB — BASIC METABOLIC PANEL
Anion Gap: 9 (ref 7–16)
BUN: 25 mg/dL — ABNORMAL HIGH (ref 7–18)
Calcium, Total: 9 mg/dL (ref 8.5–10.1)
Chloride: 104 mmol/L (ref 98–107)
Co2: 28 mmol/L (ref 21–32)
Creatinine: 0.99 mg/dL (ref 0.60–1.30)
Glucose: 100 mg/dL — ABNORMAL HIGH (ref 65–99)
OSMOLALITY: 286 (ref 275–301)
POTASSIUM: 3.9 mmol/L (ref 3.5–5.1)
SODIUM: 141 mmol/L (ref 136–145)

## 2014-03-04 LAB — HEMATOCRIT: HCT: 26.5 % — AB (ref 40.0–52.0)

## 2014-03-31 ENCOUNTER — Ambulatory Visit: Payer: Self-pay | Admitting: Unknown Physician Specialty

## 2014-04-03 LAB — PATHOLOGY REPORT

## 2014-07-25 DIAGNOSIS — E7800 Pure hypercholesterolemia, unspecified: Secondary | ICD-10-CM | POA: Insufficient documentation

## 2014-09-10 DIAGNOSIS — R05 Cough: Secondary | ICD-10-CM | POA: Insufficient documentation

## 2014-09-10 DIAGNOSIS — R053 Chronic cough: Secondary | ICD-10-CM | POA: Insufficient documentation

## 2014-10-15 DIAGNOSIS — Z79899 Other long term (current) drug therapy: Secondary | ICD-10-CM | POA: Insufficient documentation

## 2014-12-01 ENCOUNTER — Other Ambulatory Visit: Payer: Self-pay | Admitting: Neurosurgery

## 2014-12-01 DIAGNOSIS — M48061 Spinal stenosis, lumbar region without neurogenic claudication: Secondary | ICD-10-CM

## 2014-12-05 ENCOUNTER — Ambulatory Visit
Admission: RE | Admit: 2014-12-05 | Discharge: 2014-12-05 | Disposition: A | Discharge status: Home / Self Care | Payer: BLUE CROSS/BLUE SHIELD | Source: Ambulatory Visit | Attending: Neurosurgery | Admitting: Neurosurgery

## 2014-12-05 DIAGNOSIS — M48061 Spinal stenosis, lumbar region without neurogenic claudication: Secondary | ICD-10-CM

## 2014-12-05 MED ORDER — GADOBENATE DIMEGLUMINE 529 MG/ML IV SOLN
20.0000 mL | Freq: Once | INTRAVENOUS | Status: AC | PRN
  Administered 2014-12-05: 20 mL via INTRAVENOUS

## 2014-12-30 NOTE — H&P (Signed)
PATIENT NAME:  Francisco [name], Francisco [name] MR#:  [id number] DATE OF BIRTH:  1946-05-15  DATE OF ADMISSION:  04/16/2012  CHIEF COMPLAINT: Prostate cancer.   HISTORY OF PRESENT ILLNESS: Mr. Bergum is a 69 year old Caucasian male who was found to have an elevated PSA of 4.08 and subsequently underwent ultrasound-guided needle biopsy of prostate on 03/06/12. Ultrasound revealed a 55.2 gram prostate and biopsy revealed Gleason's grade 3 + 3 adenocarcinoma involving 2 out of 12 biopsies taken. 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 He also has significant benign prostatic hypertrophy and has been on finasteride and Casodex since June. He comes in now for radical retropubic prostatectomy.   ALLERGIES: Patient was allergic to codeine and meclizine.   PAST SURGICAL HISTORY:  1. Lumbar laminectomy 1985.  2. Cervical spine laminectomy 2012. 3. Cardiac catheterization 2006. 4. Inguinal herniorrhaphy in 1994.   SOCIAL HISTORY: patient denied cigarette use. He drinks alcohol socially.   FAMILY HISTORY: Remarkable for mother who died of a stroke and history of hypertension. There is no family history of prostate cancer.   PAST AND CURRENT MEDICAL CONDITIONS:  1. Allergic rhinitis.  2. Hyperlipidemia.  3. Hypertension.  4. Chronic back and neck pain.  5. History of shingles.  6. Hypothyroidism. 7. Chronic iron deficiency anemia. 8. History of atypical chest pain which had complete cardiac evaluation 2007 and chest pain was felt to be due to gastroesophageal reflux disease. Patient has been cleared by his primary care physician for surgery.   REVIEW OF SYSTEMS: Patient denied shortness of breath, diabetes, or stroke.   CURRENT MEDICATIONS:  1. Casodex. 2. Finasteride. 3. Ibuprofen. 4. Levothyroxine. 5. Lisinopril. 6. Lovastatin. 7. Vitamin D. 8. Colace.   PHYSICAL EXAMINATION:  GENERAL: Well-nourished white male in no acute distress.   HEENT: Sclerae were  clear. Pupils were equally round, reactive to light and accommodation. Extraocular movements were intact.   NECK: Supple. No palpable cervical adenopathy. He had a right neck scar consistent with C-spine surgery. He had no audible carotid bruits.   LUNGS: Clear to auscultation.   CARDIOVASCULAR: Regular rhythm and rate without audible murmurs.   ABDOMEN: Soft, nontender abdomen.   GENITOURINARY: Circumcised. Testes smooth and nontender, 18 mL in size each. No hernia defects palpable.   RECTAL: 35 gram smooth nontender prostate.   NEUROMUSCULAR: Alert and oriented x3, nonfocal.   IMPRESSION: T1c Gleason's grade 3 + 3 adenocarcinoma prostate.   PLAN: Radical retropubic prostatectomy.  ____________________________ Otelia Limes. Yves Dill, MD mrw:cms D: 04/10/2012 11:08:40 ET T: 04/10/2012 11:35:29 ET JOB#: [id number]  cc: Otelia Limes. Yves Dill, MD, <Dictator> Royston Cowper MD ELECTRONICALLY SIGNED 04/12/2012 8:42

## 2014-12-30 NOTE — Op Note (Signed)
PATIENT NAME:  Francisco [name], Francisco [name] MR#:  [id number] DATE OF BIRTH:  May 23, 1946  DATE OF PROCEDURE:  04/16/2012  PREOPERATIVE DIAGNOSIS: Localized prostate cancer.   POSTOPERATIVE DIAGNOSIS: Localized prostate cancer.   PROCEDURE: Radical retropubic prostatectomy.   SURGEON: Aahan Marques. Yves Dill, MD   ANESTHETIST: Dr. [place] Service   ANESTHETIC METHOD: General.   INDICATIONS: See the dictated history and physical. After informed consent, the patient requests the above procedure.   OPERATIVE SUMMARY: After adequate general anesthesia had been obtained, the patient was placed in a supine position and the abdomen and perineum were prepped and draped in the usual fashion. 69 French Foley catheter was inserted in a sterile fashion. Midline infraumbilical incision was made and carried down sharply through the skin and through the subcutaneous fatty tissue with cautery. Rectus fascia was then sharply divided. Prevesical fascia were sharply divided and the retropubic space was developed using blunt dissection. A Bookwalter retractor was placed. The endopelvic fascia was incised bilaterally. Puboprostatic ligaments were sharply divided. Dorsal vein complex was doubly ligated with a 2-0 Vicryl and then divided. Lateral prostatic fascia was incised bilaterally and the neurovascular bundles were dissected laterally off the surface of the prostate. The urethra was then divided anteriorly and catheter was brought out through the urethra. Posterior urethra was then divided. Rectourethralis muscle was then sharply divided and the prostate elevated off the surface of the rectum using blunt dissection. Lateral prostatic pedicles were then ligated with 2-0 silk or divided using the Harmonic scalpel. The ampulla of the vas was then identified bilaterally and divided over hemoclips. Seminal vesicles were then dissected free of the surrounding structures using blunt and sharp dissection and cautery for venous control. Remaining  pedicles were divided. The bladder neck muscle was divided with cautery down to the level of the urethra. The urethra was then divided and specimen removed and sent to pathology. The patient had a thickened posterior bladder neck, therefore, specimen was sent for margin which was negative on frozen section. Ampule of indigo carmine was given by the anesthetist and both ureteral orifices were identified. The bladder neck was then reconstructed by closing it posteriorly using a tennis racquet closure with 2-0 chromic suture taking care to preserve the orifices of the ureters. A standard urethrovesical anastomosis was performed using 2-0 Vicryl placed at the 2 o'clock, 4 o'clock, 8 o'clock, and 10 o'clock positions. After the anastomosis was complete, the catheter was irrigated and no leaks were noted and all clots were evacuated from the bladder. The incision was then closed in layers consisting of 0 chromic for the rectus muscle followed by 0 Prolene for the rectus fascia. Subcutaneous fatty tissue was reapproximated with interrupted 3-0 plain catgut and skin was approximated with staples. Sterile dressing was applied. Benzoin and pink tape was applied to the penis and catheter to anchor the catheter should the balloon become deflated. Sponge, needle, and instrument counts were noted to be correct. Estimated blood loss was 500 mL.  The patient tolerated the procedure well and was transferred to the recovery room in stable condition.   ____________________________ Otelia Limes. Yves Dill, MD mrw:drc D: 04/16/2012 12:57:50 ET T: 04/16/2012 13:07:36 ET JOB#: [id number]  cc: Otelia Limes. Yves Dill, MD, <Dictator> Royston Cowper MD ELECTRONICALLY SIGNED 04/17/2012 7:44

## 2015-01-03 NOTE — H&P (Signed)
PATIENT NAME:  Francisco [name], Francisco [name] MR#:  [id number] DATE OF BIRTH:  Jul 12, 1946  DATE OF ADMISSION:  03/02/2014  REFERRING PHYSICIAN: Dr. Reita Cliche.   FAMILY PHYSICIAN: Dr. Caryl Comes.   REASON FOR ADMISSION: Shortness of breath.   HISTORY OF PRESENT ILLNESS: The patient is a 69 year old male with a history of myasthenia gravis, benign hypertension, hyperlipidemia and hypothyroidism. Has chronic anemia. Underwent lumbar back surgery approximately one week ago by Dr. Annette Stable in [place]. Presents to the Emergency Room with worsening shortness of breath and peripheral edema. Denies chest pain. In the Emergency Room, the patient's CT of the chest revealed no PE, but did reveal some basilar edema versus consolidation. He does have peripheral edema and his BNP is elevated. No history of heart failure or coronary artery disease. He is now admitted for further evaluation.   PAST MEDICAL HISTORY: 1. Myasthenia gravis.  2. Environmental allergies, allergic rhinitis.  3. History of atypical chest pain.  4. Benign hypertension.  5. Hyperlipidemia.  6. History of shingles.  7. Hypothyroidism.  8. Chronic anemia.  9. Degenerative disk disease.  10. History of elevated PSA with prostate cancer.  11. Status post C-spine surgery.  12. Status post lumbar surgery.   MEDICATIONS: 1. Synthroid 88 mcg p.o. daily.  2. Dulcolax 10 mg p.o. daily.  3. Mestinon 60 mg p.o. b.i.d. 4. Aspirin 81 mg p.o. daily.  5. Prednisone 40 mg every other day.  6. Brio Inhaler 1 puff daily.  7. Losartan 50 mg p.o. daily.  8. Norco 10/325, 1 p.o. q.6 hours p.r.n.  9. Flexeril 10 mg p.o. t.i.d.  10. Singulair 10 mg p.o. daily.   ALLERGIES: CIPRO, ZITHROMAX, CODEINE AND MECLIZINE.   SOCIAL HISTORY: The patient quit smoking over five years ago. No history of alcohol abuse.   FAMILY HISTORY: Positive for hypertension, stroke, and ENT cancer.   REVIEW OF SYSTEMS:  CONSTITUTIONAL: No fever or change in weight.  EYES: No blurred or  double vision. No glaucoma.  ENT: No tinnitus or hearing loss. No nasal bleeding. No discharge. No difficulty swallowing.  RESPIRATORY: Some cough but no wheezing or hemoptysis. No painful respiration.  CARDIOVASCULAR: No chest pain or orthopnea. No palpitations or syncope.  GASTROINTESTINAL: No nausea, vomiting, or diarrhea. No abdominal pain.  GENITOURINARY: No dysuria or hematuria. No incontinence.  ENDOCRINE: No polyuria or polydipsia. No heat or cold intolerance.  HEMATOLOGIC: The patient admits to anemia, but denies easy bruising or bleeding.  LYMPHATIC: No swollen glands.  MUSCULOSKELETAL: The patient has some back and neck pain, but denies shoulder, knee or hip pain. No gout.  NEUROLOGIC: No numbness or migraines. Denies stroke or seizures.  PSYCHIATRIC: The patient denies anxiety, insomnia or depression.   PHYSICAL EXAMINATION: GENERAL: The patient is in no acute distress.  VITAL SIGNS: Currently remarkable for a blood pressure of 143/80 with a heart rate of 80, respiratory rate of 19, temperature of 97.7, sat 97% on room air.  HEENT: Normocephalic, atraumatic. Pupils equally round and reactive to light and accommodation. Extraocular movements are intact. Sclerae are anicteric. Conjunctivae are clear.  OROPHARYNX: Clear.  NECK: Supple without JVD. No adenopathy or thyromegaly is noted.   LUNGS: Reveal basilar rales without wheezes or rhonchi. No dullness. Respiratory effort is normal.  CARDIAC: Regular rate and rhythm with normal S1 and S2. No significant rubs, murmurs or gallops. PMI is nondisplaced. Chest wall is nontender.  ABDOMEN: Soft, nontender, with normoactive bowel sounds. No organomegaly or masses were appreciated. No hernias or bruits were  noted.  EXTREMITIES: Reveal 1+ edema, without clubbing or cyanosis. Pulses were 2+ bilaterally.  SKIN: Warm and dry without rash or lesions.  NEUROLOGIC: Cranial nerves II through XII grossly intact. Deep tendon reflexes were  symmetric. Motor and sensory exam is nonfocal.  PSYCHIATRIC: Exam revealed a patient who is alert and oriented to person, place, and time. He was cooperative and used good judgment.   LABORATORY DATA: EKG revealed sinus rhythm with nonspecific lateral ST-T wave changes. CT of the chest revealed no evidence of pulmonary embolism. There was some possible atelectasis, more notable at the right lung base with a small right pleural effusion. White count was 9.4 with a hemoglobin of 8.8. Glucose 99 with a BUN of 27, creatinine 1.20 and a GFR of greater than 60. BNP was 693. Troponin was 0.04.   ASSESSMENT: 1. Shortness of breath and peripheral edema, worrisome for congestive heart failure.  2. Abnormal EKG.  3. Worsening anemia.  4. Myasthenia gravis.  5. Chronic obstructive pulmonary disease.   PLAN: The patient will be observed on telemetry with IV Lasix and empiric IV antibiotics. We will optimize his pulmonary regimen. We will follow serial cardiac enzymes and obtain an echocardiogram. We will send off anemia labs and guaiac all stools. Follow up labs and chest x-ray in the morning. We will continue his back and myasthenia gravis medications at this time. Further treatment and evaluation will depend upon the patient's progress.   TOTAL TIME SPENT ON THIS PATIENT: 45 minutes.   ____________________________ Leonie Douglas Doy Hutching, MD jds:sg D: 03/02/2014 11:40:00 ET T: 03/02/2014 12:02:54 ET JOB#: [id number]  cc: Leonie Douglas. Doy Hutching, MD, <Dictator> Adin Hector, MD  JEFFREY Lennice Sites MD ELECTRONICALLY SIGNED 03/02/2014 17:34

## 2015-01-03 NOTE — Discharge Summary (Signed)
PATIENT NAME:  Francisco [name], FEARNOW MR#:  [id number] DATE OF BIRTH:  1946/01/31  DATE OF ADMISSION:  03/02/2014 DATE OF DISCHARGE:  03/04/2014  FINAL DIAGNOSES:  1.  Symptomatic anemia.  2.  Myasthenia gravis.  3.  Degenerative disk disease with recent lumbar spinal procedure.  4.  Hypertension.  5.  Hypothyroidism.  6.  Chronic obstructive pulmonary disease.  7.  Nocturnal hypoxia.  8.  History of prostate cancer.  9.  Hyperlipidemia.  10. Allergic rhinitis.   HISTORY AND PHYSICAL: Please see dictated admission history and physical.   [place]: The patient was admitted with increasing shortness of breath. This was mainly with exertion, but also occurred with bending forward. Initial films showed no evidence of pulmonary edema. BNP was minimally elevated; however, cardiac enzymes were negative. An echocardiogram showed preserved LV function. He is known to have mild increased heart pressures and pulmonary hypertension, and has been on nocturnal oxygen for nocturnal hypoxia as well as inhaled medications for COPD. Diuresis was initiated, but then stopped based on the findings of his films. CT angiogram was performed, which showed no evidence of pulmonary embolism. He is noted to have hemoglobin of 9, down from baseline of about 12 to 13, and it was felt that this played a significant role in his dyspnea, particularly dyspnea with exertion. He was noted to have some abdominal distention and has had some constipation since getting out of surgery and it was felt this contributed to his shortness of breath with bending forward.   Blood counts were followed and these were stable. He was taken off aspirin because of the low blood counts and he was found to be heme-positive on Hemoccult. GI saw the patient and they recommended outpatient endoscopy. This will be arranged for the patient. The patient follows with Dr. Vira Agar and consultation will be set up through his office as soon as  possible. The patient's valve function improved and his shortness of breath improved markedly. He developed some minimal dehydration with diuresis; however, this resolved with holding of the diuretics. Blood pressure on the morning of discharge was 136/82. He has been ambulating and feels well. His back continues to heal and his wound site appears clean, dry, and intact. He was instructed that he may shower, ambulate as able, and he has follow-up with Dr. Annette Stable coming up on Thursday, which he should keep. The patient is discharged home in stable condition with his physical activity up as tolerated. His diet should be 2 grams sodium. He should check his blood pressure daily and record this. He will use oxygen 2 L nasal cannula when sleeping. He will follow up in my office in 1 to 2 weeks, with Dr. Vira Agar in 2 weeks and with Dr. Annette Stable as scheduled.   DISCHARGE MEDICATIONS:  1.  Levothyroxine 0.088 mg p.o. daily.  2.  Colace 100 mg 2 tablets p.o. b.i.d.  3.  Losartan 50 mg p.o. daily.  4.  Pyridostigmine 120 mg p.o. daily.  5.  Pulmicort 1 puff b.i.d.  6.  Singulair 10 mg p.o. daily.  7.  Norco 10/325, 1 p.o. every 6 hours, as needed.  8.  Prednisone 40 mg every other day.  10. Cyclobenzaprine 10 mg p.o. t.i.d. as needed for muscle spasm.  11. Ferrous sulfate 160 mg p.o. once daily with food.  12. Pantoprazole 40 mg p.o. b.i.d.  13. At this time, he will hold aspirin.  ____________________________ Adin Hector, MD bjk:aw D: 03/04/2014 07:52:58  ET T: 03/04/2014 08:02:43 ET JOB#: [id number]  cc: Adin Hector, MD, <Dictator> Manya Silvas, MD Cooper Render. Annette Stable, MD Ramonita Lab MD ELECTRONICALLY SIGNED 03/04/2014 12:49

## 2015-01-03 NOTE — Consult Note (Signed)
Brief Consult Note: Diagnosis: IDA/Heme positive.   Patient was seen by consultant.   Comments: Francisco [name] is a very pleasant 69 y/o caucasian male with iron deficiency anemia & hemoccult positive stool without any GI complaints or gross GI bleeding.  Hgb stable this admission 8-9 range & he likely had some small volume blood loss with back surgery last week in [place].  If hgb remains stable he can have outpatient colonoscopy & EGD in next 1-2 weeks with Dr Allen Norris to look for source of IDA including malignancy, colorectal polyp, PUD or gastritis.  Discussed risks/benefits of procedure which include but are not limited to bleeding, infection, perforation & drug reaction.  Patient agrees with this plan & consent will be obtained.  I have discussed her care with Dr Evangeline [facility] & our plan of care is below.   Plan: 1) Agree with iron, but needs to be held 7 days prior to colonoscopy 2) Continue PPI BID for gastric protection on aspirin & prednisone 3) H/H AM 4) EGD & colonoscopy in near future with Dr Allen Norris Thanks for allowing [place] to participate in his care.  Please see full dictated note. #[id number].  Electronic Signatures: Andria Meuse (NP)  (Signed 22-Jun-15 22:01)  Authored: Brief Consult Note   Last Updated: 22-Jun-15 22:01 by Andria Meuse (NP)

## 2015-01-03 NOTE — Consult Note (Signed)
Chief Complaint:  Subjective/Chief Complaint Pt denies rectal bleeding or melena.  SOB much improved.  Energy improved.   VITAL SIGNS/ANCILLARY NOTES: **Vital Signs.:   23-Jun-15 05:34  Vital Signs Type Routine  Temperature Temperature (F) 97.7  Celsius 36.5  Temperature Source oral  Pulse Pulse 71  Respirations Respirations 20  Systolic BP Systolic BP 99  Diastolic BP (mmHg) Diastolic BP (mmHg) 60  Mean BP 73  Pulse Ox % Pulse Ox % 95  Pulse Ox Activity Level  At rest  Oxygen Delivery Room Air/ 21 %   Brief Assessment:  GEN well developed, well nourished, no acute distress, A/Ox3, wife at bedside   Cardiac Regular   Respiratory normal resp effort   Gastrointestinal Normal   Gastrointestinal details normal Soft  Nontender  Nondistended  Bowel sounds normal  No rebound tenderness  No gaurding   EXTR negative cyanosis/clubbing, negative edema   Additional Physical Exam Skin: pink, warm, dry   Lab Results: Routine Chem:  23-Jun-15 04:50   BUN  25  Creatinine (comp) 0.99  Sodium, Serum 141  Potassium, Serum 3.9  Chloride, Serum 104  CO2, Serum 28  Calcium (Total), Serum 9.0  Anion Gap 9  Osmolality (calc) 286  eGFR (African American) >60  eGFR (Non-African American) >60 (eGFR values <33m/min/1.73 m2 may be an indication of chronic kidney disease (CKD). Calculated eGFR is useful in patients with stable renal function. The eGFR calculation will not be reliable in acutely ill patients when serum creatinine is changing rapidly. It is not useful in  patients on dialysis. The eGFR calculation may not be applicable to patients at the low and high extremes of body sizes, pregnant women, and vegetarians.)  Routine Hem:  23-Jun-15 04:50   Hematocrit (CBC)  26.5 (Result(s) reported on 04 Mar 2014 at 05:59AM.)   Assessment/Plan:  Assessment/Plan:  Assessment Iron deficiency anemia/Hemoccult positive stool:  Hct stable.  No gross gastrointestinal bleeding.   Plan 1.   Agree with iron, but needs to be held 7 days prior to the colonoscopy.  2.  Continue PPI b.i.d. for gastric protection on aspirin and prednisone.  3.  EGD and colonoscopy in next 1-2 weeks  Please call if you have any questions or concerns   Electronic Signatures: JAndria Meuse(NP)  (Signed 23-Jun-15 09:34)  Authored: Chief Complaint, VITAL SIGNS/ANCILLARY NOTES, Brief Assessment, Lab Results, Assessment/Plan   Last Updated: 23-Jun-15 09:34 by JAndria Meuse(NP)

## 2015-01-03 NOTE — Consult Note (Signed)
PATIENT NAME:  Francisco [name], Francisco [name] MR#:  [id number] DATE OF BIRTH:  28-May-1946  DATE OF CONSULTATION:  03/03/2014  REFERRING PHYSICIAN:  Dr. Caryl Comes. CONSULTING PHYSICIAN:  Andria Meuse, NP  PRIMARY CARE PHYSICIAN:  Dr. Caryl Comes.  REASON FOR CONSULTATION:   Hemoccult positive stool and iron deficiency anemia.   HISTORY OF PRESENT ILLNESS:  Francisco [name] is a pleasant 69 year old male who was admitted with worsening shortness of breath and lower extremity edema. He has history of myasthenia gravis, hypertension, hyperlipidemia, hypothyroidism and chronic anemia. He had lumbar back surgery 1 week ago by Dr. Annette Stable in [place]. His hemoglobin was 13.1 back in 2013 and he was admitted with a hemoglobin of 8.8 and a hematocrit of 9.2. He tells me he felt like his abdomen was tight and hard a couple of days last week. He had gone 3 days with constipation, but has since had a bowel movement. He takes Dulcolax daily to prevent constipation. He denies any rectal bleeding or melena. He has a history of prostate cancer 2 years ago and has been in remission. His iron was 33. His TIBC, UIBC and percent saturation were all normal. He does take aspirin 81 mg daily and prednisone 40 mg daily for his myasthenia gravis. He denies any heartburn, indigestion, dysphagia or odynophagia. His appetite has been fine. He denies any rectal bleeding or melena. His weight has been steadily increasing on the prednisone. He had a BNP of 693. His last colonoscopy was 05/08/2009, where he had 2 hyperplastic  polyps removed and hemorrhoids.   PAST MEDICAL AND SURGICAL HISTORY: He is followed by Dr. Lucky Cowboy for carotid stenosis. He has had a left inguinal hernia repair, cervical fusion, back surgeries x 2, prostatectomy, pulmonary hypertension, CHF, COPD, allergic rhinitis, myasthenia gravis and hypertension. Colonoscopy with hyperplastic polyps removed and hemorrhoids, 05/08/2009, by Dr. Vira Agar. History of shingles, degenerative disk disease,  prostatectomy for prostate carcinoma.   MEDICATIONS PRIOR TO ADMISSION:  Synthroid 88 mcg daily, Dulcolax 10 mg daily, Mestinon 60 mg b.i.d., aspirin 81 mg daily, prednisone 40 mg daily, Breo inhaler 1 puff daily, losartan 50 mg daily, Norco 10/325 q.6 hours p.r.n., Flexeril 10 mg t.i.d., Singulair 10 mg daily.    ALLERGIES:  CIPRO, ZITHROMAX, CODEINE AND MECLIZINE.   FAMILY HISTORY:  Positive for throat cancer in 2 daughters and a father with alcoholism. There is no known history of chronic liver disease or colorectal carcinoma or GI problems.   SOCIAL HISTORY:  He is married. He has 2 daughters who are alcoholics. He quit smoking 13 years ago. He has had a 40 pack-year history of tobacco abuse. He denies any illicit drug use. He works for [employer] in [employer].     REVIEW OF SYSTEMS:   CONSTITUTIONAL:  He has had some fatigue and malaise.  RESPIRATORY:  Some nonproductive cough. Denies hemoptysis.  MUSCULOSKELETAL:  He does have some chronic back and neck pain. Otherwise, negative 12-point review of systems.   PHYSICAL EXAMINATION: VITAL SIGNS:  Temp 98, pulse 88, respirations 20, blood pressure 120/64, O2 sat 93% on room air.  GENERAL:  He is an obese Caucasian male who is alert, oriented, pleasant and cooperative in no acute distress.  HEENT:  Sclerae clear, anicteric. Conjunctivae pink. Oropharynx pink and moist without any lesions.  NECK:  Supple without mass or thyromegaly.  CHEST:  Heart regular rate and rhythm. Normal S1 and  S2 without murmurs, clicks, rubs or gallops.  LUNGS:   Clear to  auscultation bilaterally.  ABDOMEN:  Protuberant. Positive bowel sounds x 4. No bruits auscultated. Abdomen is soft, nontender, nondistended without palpable mass or hepatosplenomegaly. No rebound, tenderness or guarding.  EXTREMITIES:  Without clubbing. He does have trace pretibial edema bilaterally.  NEUROLOGIC:  Grossly intact.  SKIN:  Pink, warm and dry without any rash or  jaundice.  PSYCHIATRIC:   He is alert, cooperative, normal mood and affect.  MUSCULOSKELETAL:  Good, equal movement and strength bilaterally.   LABORATORY STUDIES:  See HPI. BUN 23, anion gap 6, otherwise normal basic metabolic panel. Troponin negative x 3. Hemoglobin 9.2, hematocrit 29.3, white blood cell count 9.3, platelet count 360. Occult blood positive.   IMAGING: CT ANGIOGRAM OF CHEST: Atelectasis at the right lung base. Small right pleural effusion or early infiltrate.   IMPRESSION:  Francisco [name] is a very pleasant 69 year old Caucasian male with iron deficiency anemia and Hemoccult positive stool without any gastrointestinal complaints or gross gastrointestinal bleeding. His hemoglobin is stable this admission, 8 to 9 range, and he likely has had some small volume blood loss with back surgery last week in [place]. If his hemoglobin remains stable, he can have outpatient colonoscopy and EGD in the next 1 to 2 weeks with Dr. Allen Norris to look for source of iron deficiency anemia including malignancy, colorectal polyp, peptic ulcer disease or gastritis. I discussed risks and benefits included, but were not limited to bleeding, infection, perforation, drug reaction. He agrees with this plan and consent will be obtained. I have discussed his care with Dr. Lucilla Lame and our plan of care is below.   PLAN: 1.  Agree with iron, but needs to be held 7 days prior to the colonoscopy.  2.  Continue PPI b.i.d. for gastric protection on aspirin and prednisone.  3.  H and H in the morning.  4.  EGD and colonoscopy in the near future with Dr. Allen Norris.   Thank you for allowing [place] to participate in the care of Francisco [name].    ____________________________ Andria Meuse, NP klj:dmm D: 03/03/2014 22:01:35 ET T: 03/03/2014 22:40:58 ET JOB#: [id number]  cc: Andria Meuse, NP, <Dictator> Adin Hector, MD Andria Meuse FNP ELECTRONICALLY SIGNED 03/14/2014 16:43

## 2015-01-05 DIAGNOSIS — M5410 Radiculopathy, site unspecified: Secondary | ICD-10-CM | POA: Insufficient documentation

## 2015-05-12 ENCOUNTER — Ambulatory Visit: Payer: BLUE CROSS/BLUE SHIELD | Admitting: Internal Medicine

## 2015-05-26 ENCOUNTER — Encounter: Payer: Self-pay | Admitting: Internal Medicine

## 2015-05-26 ENCOUNTER — Ambulatory Visit (INDEPENDENT_AMBULATORY_CARE_PROVIDER_SITE_OTHER): Payer: BLUE CROSS/BLUE SHIELD | Admitting: Internal Medicine

## 2015-05-26 ENCOUNTER — Encounter (INDEPENDENT_AMBULATORY_CARE_PROVIDER_SITE_OTHER): Payer: Self-pay

## 2015-05-26 VITALS — BP 128/60 | HR 57 | Ht 70.5 in | Wt 240.0 lb

## 2015-05-26 DIAGNOSIS — G7000 Myasthenia gravis without (acute) exacerbation: Secondary | ICD-10-CM | POA: Diagnosis not present

## 2015-05-26 DIAGNOSIS — J439 Emphysema, unspecified: Secondary | ICD-10-CM

## 2015-05-26 MED ORDER — TIOTROPIUM BROMIDE MONOHYDRATE 18 MCG IN CAPS: 18.0000 ug | ORAL_CAPSULE | Freq: Every day | RESPIRATORY_TRACT | Status: DC

## 2015-05-26 NOTE — Assessment & Plan Note (Signed)
-  Seems to be in remission at this time -continue CPAP-assuming this is used for resp muscle fatigue -follow up with [facility] -continue meds

## 2015-05-26 NOTE — Patient Instructions (Signed)

## 2015-05-26 NOTE — Assessment & Plan Note (Addendum)
-  findings c/w Moderate COPD  -recommend starting Spiriva -continue albuterol inhaler as needed -continue inhaled steroids/long acting beta agonist(ANORA) -recommend repeat PFT's at next visit and 6 min walk

## 2015-05-26 NOTE — Progress Notes (Signed)
[facility] Consultation      Date: 05/26/2015,   MRN# [id number] Francisco [name] [birth year] Code Status:  Hosp day:@LENGTHOFSTAYDAYS @ Referring MD: @ATDPROV @     PCP:      AdmissionWeight: 240 lb (108.863 kg)                 CurrentWeight: 240 lb (108.863 kg) Francisco [name] is a 69 y.o. old male seen in consultation for COPD     CHIEF COMPLAINT:   Patient want to know about his COPD   HISTORY OF PRESENT ILLNESS   69 yo white male has been dx with COPD several years ago Patient is former smoker 40 pack year quit 15 years ago Patient has intermittant SOB with increased WOB some wheezing that has been going on for years Wife states that is has affected his work at times, patient also with intermittent cramping of hands Patient has been on inhaled BD therapy for last year Patient has a dx of MG seen at [facility] on chronic prednisone/chemo regimen Patient has had fevers and chills 2 weesk and just finished abx Has CPAP at night ofr OSA/MG Patient has occasional sweats due to increased exertion, also occurs at rest  Last PFT 12/2013 Ratio 60% FEV1 54% DLCO 60% FEF25/75 17%  Findings discussed with patient. Moderate COPD     PAST MEDICAL HISTORY   Past Medical History  Diagnosis Date  . Hypothyroidism     pt takes Levothyroxine daily  . Hypertension     doesn't have a cardiologist;pt is maintained by medical md for htn;[facility]  . Sleep apnea     do not use CPAP every night  . COPD (chronic obstructive pulmonary disease)   . Bronchitis, chronic   . Cancer     Prostate cancr  . Shortness of breath     Lung MD- Dr Darlin Coco  . Myasthenia gravis, adult form      SURGICAL HISTORY   Past Surgical History  Procedure Laterality Date  . Cardiac catheterization      2005 at [facility]  . Anterior cervical decomp/discectomy fusion  07/18/2011    Procedure: ANTERIOR CERVICAL DECOMPRESSION/DISCECTOMY FUSION 2 LEVELS;   Surgeon: Cooper Render Pool;  Location: [place] NEURO ORS;  Service: Neurosurgery;  Laterality: N/A;  cervical five-six, cervical six-seven anterior cervical discectomy and fusion  . Back surgery      in [place] repair Left     inguinal hernia repair in 1997  . Prostatectomy  8/14    [facility] Dr Mare Ferrari   . Lumbar laminectomy/decompression microdiscectomy Left 02/24/2014    Procedure: LUMBAR LAMINECTOMY/DECOMPRESSION MICRODISCECTOMY LUMBAR THREE-FOUR, FOUR-FIVE, LEFT FIVE-SACRAL ONE ;  Surgeon: Charlie Pitter, MD;  Location: [place] NEURO ORS;  Service: Neurosurgery;  Laterality: Left;  LUMBAR LAMINECTOMY/DECOMPRESSION MICRODISCECTOMY LUMBAR THREE-FOUR, FOUR-FIVE, LEFT FIVE-SACRAL ONE      FAMILY HISTORY   History reviewed. No pertinent family history.   SOCIAL HISTORY   Social History  Substance Use Topics  . Smoking status: Former Smoker -- 1.00 packs/day for 30 years    Types: Cigarettes  . Smokeless tobacco: Never Used  . Alcohol Use: Yes     MEDICATIONS    Home Medication:  Current Outpatient Rx  Name  Route  Sig  Dispense  Refill  . bisacodyl (DULCOLAX) 5 MG EC tablet   Oral   Take 5 mg by mouth daily.         Marland Kitchen  gabapentin (NEURONTIN) 300 MG capsule   Oral   Take 300 mg by mouth 2 (two) times daily.         Marland Kitchen HYDROcodone-acetaminophen (NORCO) 10-325 MG per tablet   Oral   Take 1-2 tablets by mouth every 4 (four) hours as needed for moderate pain.   60 tablet   0   . levothyroxine (SYNTHROID, LEVOTHROID) 88 MCG tablet   Oral   Take 88 mcg by mouth daily.           Marland Kitchen losartan (COZAAR) 50 MG tablet   Oral   Take 50 mg by mouth 2 (two) times daily.          . mirabegron ER (MYRBETRIQ) 25 MG TB24 tablet   Oral   Take 50 mg by mouth daily.         . montelukast (SINGULAIR) 10 MG tablet   Oral   Take 10 mg by mouth at bedtime.         . pantoprazole (PROTONIX) 40 MG tablet   Oral   Take 40 mg by mouth daily.         . predniSONE  (DELTASONE) 20 MG tablet   Oral   Take 40 mg by mouth every other day.         . pyridostigmine (MESTINON) 60 MG tablet   Oral   Take 120 mg by mouth daily.         Marland Kitchen Umeclidinium-Vilanterol (ANORO ELLIPTA) 62.5-25 MCG/INH AEPB   Inhalation   Inhale 1 puff into the lungs daily.           Current Medication:  Current outpatient prescriptions:  .  bisacodyl (DULCOLAX) 5 MG EC tablet, Take 5 mg by mouth daily., Disp: , Rfl:  .  gabapentin (NEURONTIN) 300 MG capsule, Take 300 mg by mouth 2 (two) times daily., Disp: , Rfl:  .  HYDROcodone-acetaminophen (NORCO) 10-325 MG per tablet, Take 1-2 tablets by mouth every 4 (four) hours as needed for moderate pain., Disp: 60 tablet, Rfl: 0 .  levothyroxine (SYNTHROID, LEVOTHROID) 88 MCG tablet, Take 88 mcg by mouth daily.  , Disp: , Rfl:  .  losartan (COZAAR) 50 MG tablet, Take 50 mg by mouth 2 (two) times daily. , Disp: , Rfl:  .  mirabegron ER (MYRBETRIQ) 25 MG TB24 tablet, Take 50 mg by mouth daily., Disp: , Rfl:  .  montelukast (SINGULAIR) 10 MG tablet, Take 10 mg by mouth at bedtime., Disp: , Rfl:  .  pantoprazole (PROTONIX) 40 MG tablet, Take 40 mg by mouth daily., Disp: , Rfl:  .  predniSONE (DELTASONE) 20 MG tablet, Take 40 mg by mouth every other day., Disp: , Rfl:  .  pyridostigmine (MESTINON) 60 MG tablet, Take 120 mg by mouth daily., Disp: , Rfl:  .  Umeclidinium-Vilanterol (ANORO ELLIPTA) 62.5-25 MCG/INH AEPB, Inhale 1 puff into the lungs daily., Disp: , Rfl:     ALLERGIES   Codeine     REVIEW OF SYSTEMS   Review of Systems  Constitutional: Positive for malaise/fatigue and diaphoresis. Negative for fever, chills and weight loss.  HENT: Negative for congestion and hearing loss.   Eyes: Negative for blurred vision and double vision.  Respiratory: Positive for shortness of breath and wheezing. Negative for cough, hemoptysis and sputum production.   Cardiovascular: Negative for chest pain, palpitations and orthopnea.    Gastrointestinal: Negative for heartburn, nausea, vomiting and abdominal pain.  Genitourinary: Negative for dysuria and urgency.  Musculoskeletal: Negative  for myalgias, back pain and neck pain.  Skin: Negative for rash.  Neurological: Negative for dizziness, tingling, tremors, weakness and headaches.  Endo/Heme/Allergies: Does not bruise/bleed easily.  Psychiatric/Behavioral: The patient is not nervous/anxious.   All other systems reviewed and are negative.    VS: BP 128/60 mmHg  Pulse 57  Ht 5' 10.5" (1.791 m)  Wt 240 lb (108.863 kg)  BMI 33.94 kg/m2  SpO2 97%     PHYSICAL EXAM  Physical Exam  Constitutional: He is oriented to person, place, and time. He appears well-developed and well-nourished. No distress.  HENT:  Head: Normocephalic and atraumatic.  Mouth/Throat: No oropharyngeal exudate.  Eyes: EOM are normal. Pupils are equal, round, and reactive to light. No scleral icterus.  Neck: Normal range of motion. Neck supple.  Cardiovascular: Normal rate, regular rhythm and normal heart sounds.   No murmur heard. Pulmonary/Chest: No stridor. No respiratory distress. He has no wheezes.  Abdominal: Soft. Bowel sounds are normal. He exhibits no distension. There is no tenderness. There is no rebound.  Musculoskeletal: Normal range of motion. He exhibits no edema.  Neurological: He is alert and oriented to person, place, and time. He displays normal reflexes. Coordination normal.  Skin: Skin is warm. He is not diaphoretic.  Psychiatric: He has a normal mood and affect.        LABS    No results for input(s): HGB, HCT, MCV, WBC, POTASSIUM, CHLORIDE, BUN, CREATININE, GLUCOSE, CALCIUM, INR, PTT in the last 72 hours.  Invalid input(s): PLATELET, BANDS, NEUTROPHIL, LYMPHOCYTE, MONOCYTE, EOSINOPHILS, BASOPHIL, SODIUM, BICARBONATE, MAGNESIUM, PHOSPHORUS, PT, SGPT, SGOT,    No results for input(s): PH in the last 72 hours.  Invalid input(s): PCO2, PO2, BASEEXCESS,  BASEDEFICITE, TFT    CULTURE RESULTS   No results found for this or any previous visit (from the past 240 hour(s)).        IMAGING    No results found.     ASSESSMENT/PLAN   69 yo white male with COPD and MG with intermittent SOB and  Wheezing, both diseases are stable at this time.   COPD (chronic obstructive pulmonary disease) -findings c/w Moderate COPD  -recommend starting Spiriva -continue albuterol inhaler as needed -continue inhaled steroids/long acting beta agonist(ANORA) -recommend repeat PFT's at next visit and 6 min walk  Myasthenia gravis -Seems to be in remission at this time -continue CPAP-assuming this is used for resp muscle fatigue -follow up with [facility] -continue meds      I have personally obtained a history, examined the patient, evaluated laboratory and independently reviewed imaging results, formulated the assessment and plan and placed orders.  The Patient requires high complexity decision making for assessment and support, frequent evaluation and titration of therapies, application of advanced monitoring technologies and extensive interpretation of multiple databases. Time spent with patient 35 minutes.  Patient is satisfied with Plan of action and management.    Corrin Parker, M.D.  [facility]  Medical Director [place] Director [facility] Cardio-Pulmonary Department

## 2015-05-28 ENCOUNTER — Telehealth: Payer: Self-pay | Admitting: Internal Medicine

## 2015-05-28 NOTE — Telephone Encounter (Signed)
Per 05/26/15 OV: COPD (chronic obstructive pulmonary disease) -findings c/w Moderate COPD   -recommend starting Spiriva -continue albuterol inhaler as needed -continue inhaled steroids/long acting beta agonist(ANORA) -recommend repeat PFT's at next visit and 6 min walk ---   lmomtcb x1

## 2015-06-01 NOTE — Telephone Encounter (Signed)
Spoke with pt and answered his questions. Nothing further needed.

## 2015-06-01 NOTE — Telephone Encounter (Signed)
lmtcb X 2 

## 2015-07-01 ENCOUNTER — Other Ambulatory Visit: Payer: Self-pay | Admitting: Physician Assistant

## 2015-07-01 DIAGNOSIS — R509 Fever, unspecified: Secondary | ICD-10-CM

## 2015-07-01 DIAGNOSIS — R7881 Bacteremia: Secondary | ICD-10-CM

## 2015-07-02 ENCOUNTER — Ambulatory Visit
Admission: RE | Admit: 2015-07-02 | Discharge: 2015-07-02 | Disposition: A | Discharge status: Home / Self Care | Payer: BLUE CROSS/BLUE SHIELD | Source: Ambulatory Visit | Attending: Physician Assistant | Admitting: Physician Assistant

## 2015-07-02 DIAGNOSIS — N201 Calculus of ureter: Secondary | ICD-10-CM | POA: Insufficient documentation

## 2015-07-02 DIAGNOSIS — R509 Fever, unspecified: Secondary | ICD-10-CM | POA: Insufficient documentation

## 2015-07-02 DIAGNOSIS — R7881 Bacteremia: Secondary | ICD-10-CM | POA: Diagnosis present

## 2015-07-05 DIAGNOSIS — N200 Calculus of kidney: Secondary | ICD-10-CM | POA: Insufficient documentation

## 2015-08-17 ENCOUNTER — Ambulatory Visit (INDEPENDENT_AMBULATORY_CARE_PROVIDER_SITE_OTHER): Payer: BLUE CROSS/BLUE SHIELD | Admitting: *Deleted

## 2015-08-17 ENCOUNTER — Ambulatory Visit (INDEPENDENT_AMBULATORY_CARE_PROVIDER_SITE_OTHER): Payer: BLUE CROSS/BLUE SHIELD | Admitting: Internal Medicine

## 2015-08-17 ENCOUNTER — Encounter: Payer: Self-pay | Admitting: Internal Medicine

## 2015-08-17 ENCOUNTER — Ambulatory Visit: Payer: BLUE CROSS/BLUE SHIELD | Admitting: *Deleted

## 2015-08-17 VITALS — BP 142/60 | HR 79 | Ht 70.0 in | Wt 243.0 lb

## 2015-08-17 DIAGNOSIS — J439 Emphysema, unspecified: Secondary | ICD-10-CM | POA: Diagnosis not present

## 2015-08-17 DIAGNOSIS — J449 Chronic obstructive pulmonary disease, unspecified: Secondary | ICD-10-CM

## 2015-08-17 LAB — PULMONARY FUNCTION TEST
DL/VA % PRED: 74 %
DL/VA: 3.44 ml/min/mmHg/L
DLCO UNC % PRED: 51 %
DLCO UNC: 16.59 ml/min/mmHg
FEF 25-75 POST: 2.75 L/s
FEF 25-75 PRE: 1.68 L/s
FEF2575-%Change-Post: 63 %
FEF2575-%PRED-POST: 113 %
FEF2575-%Pred-Pre: 69 %
FEV1-%Change-Post: 9 %
FEV1-%PRED-PRE: 56 %
FEV1-%Pred-Post: 62 %
FEV1-POST: 2.01 L
FEV1-Pre: 1.84 L
FEV1FVC-%Change-Post: 2 %
FEV1FVC-%PRED-PRE: 110 %
FEV6-%CHANGE-POST: 6 %
FEV6-%PRED-POST: 58 %
FEV6-%PRED-PRE: 54 %
FEV6-POST: 2.42 L
FEV6-Pre: 2.28 L
FEV6FVC-%PRED-POST: 106 %
FEV6FVC-%Pred-Pre: 106 %
FVC-%Change-Post: 6 %
FVC-%PRED-POST: 55 %
FVC-%Pred-Pre: 51 %
FVC-Post: 2.42 L
FVC-Pre: 2.28 L
POST FEV6/FVC RATIO: 100 %
PRE FEV1/FVC RATIO: 81 %
Post FEV1/FVC ratio: 83 %
Pre FEV6/FVC Ratio: 100 %
RV % PRED: 105 %
RV: 2.6 L
TLC % PRED: 73 %
TLC: 5.16 L

## 2015-08-17 NOTE — Progress Notes (Signed)
PFT performed today. 

## 2015-08-17 NOTE — Progress Notes (Signed)
[facility] Consultation      Date: 08/17/2015,   MRN# [id number] Francisco [name] 23-Jun-1946 Code Status:  Hosp day:@LENGTHOFSTAYDAYS @ Referring MD: @ATDPROV @     PCP:      AdmissionWeight: 243 lb (110.224 kg)                 CurrentWeight: 243 lb (110.224 kg) Francisco [name] is a 69 y.o. old male seen in consultation for COPD     CHIEF COMPLAINT:   Patient want to know about his COPD and follow up   HISTORY OF PRESENT ILLNESS   SOB has improved, no wheezing, no fevers, chills Patient has no major complaints I have explained PFT and 6MWT to patient  Last PFT 12/2013 Ratio 60% FEV1 54% DLCO 60% FEF25/75 17%  Current PFT Ratio 81% FEV1 56% FVC 51% DLCO 51% Fef 25/75 69%  6MWT WNL no evidence of drop in oxygen leves    Current Medication:  Current outpatient prescriptions:  .  aspirin 81 MG tablet, Take 81 mg by mouth daily., Disp: , Rfl:  .  bisacodyl (DULCOLAX) 5 MG EC tablet, Take 5 mg by mouth daily., Disp: , Rfl:  .  gabapentin (NEURONTIN) 300 MG capsule, Take 300 mg by mouth 2 (two) times daily., Disp: , Rfl:  .  HYDROcodone-acetaminophen (NORCO) 10-325 MG per tablet, Take 1-2 tablets by mouth every 4 (four) hours as needed for moderate pain., Disp: 60 tablet, Rfl: 0 .  levothyroxine (SYNTHROID, LEVOTHROID) 88 MCG tablet, Take 88 mcg by mouth daily.  , Disp: , Rfl:  .  losartan (COZAAR) 50 MG tablet, Take 50 mg by mouth 2 (two) times daily. , Disp: , Rfl:  .  mirabegron ER (MYRBETRIQ) 25 MG TB24 tablet, Take 50 mg by mouth daily., Disp: , Rfl:  .  montelukast (SINGULAIR) 10 MG tablet, Take 10 mg by mouth at bedtime., Disp: , Rfl:  .  pantoprazole (PROTONIX) 40 MG tablet, Take 40 mg by mouth daily., Disp: , Rfl:  .  predniSONE (DELTASONE) 20 MG tablet, Take 40 mg by mouth every other day., Disp: , Rfl:  .  pyridostigmine (MESTINON) 60 MG tablet, Take 120 mg by mouth daily., Disp: , Rfl:  .  tiotropium (SPIRIVA HANDIHALER) 18 MCG  inhalation capsule, Place 1 capsule (18 mcg total) into inhaler and inhale daily., Disp: 30 capsule, Rfl: 2 .  Umeclidinium-Vilanterol (ANORO ELLIPTA) 62.5-25 MCG/INH AEPB, Inhale 1 puff into the lungs daily., Disp: , Rfl:     ALLERGIES   Codeine     REVIEW OF SYSTEMS   Review of Systems  Constitutional: Negative for fever, chills, weight loss, malaise/fatigue and diaphoresis.  HENT: Negative for congestion.   Respiratory: Negative for cough, hemoptysis, sputum production, shortness of breath and wheezing.   Cardiovascular: Negative for chest pain.  Gastrointestinal: Negative for heartburn, nausea, vomiting and abdominal pain.  Skin: Negative for rash.  Neurological: Negative for weakness.  All other systems reviewed and are negative.    VS: BP 142/60 mmHg  Pulse 79  Ht 5\' 10"  (1.778 m)  Wt 243 lb (110.224 kg)  BMI 34.87 kg/m2  SpO2 96%     PHYSICAL EXAM  Physical Exam  Constitutional: He is oriented to person, place, and time. He appears well-developed and well-nourished. No distress.  HENT:  Mouth/Throat: No oropharyngeal exudate.  Cardiovascular: Normal rate, regular rhythm and normal heart sounds.   No murmur heard. Pulmonary/Chest: No respiratory distress. He has no wheezes.  Neurological: He  is alert and oriented to person, place, and time.  Skin: He is not diaphoretic.  Psychiatric: He has a normal mood and affect.         ASSESSMENT/PLAN   69 yo white male with Mild COPD Stage A with h/o MG, both diseases are stable at this time. 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  Patient is satisfied with Plan of action and management.   Corrin Parker, M.D.  [facility]  Medical Director [place] Director [facility] Cardio-Pulmonary Department

## 2015-08-17 NOTE — Patient Instructions (Signed)

## 2015-08-17 NOTE — Progress Notes (Signed)
SMW performed today. 

## 2015-11-11 DIAGNOSIS — G5602 Carpal tunnel syndrome, left upper limb: Secondary | ICD-10-CM | POA: Insufficient documentation

## 2015-11-22 ENCOUNTER — Other Ambulatory Visit: Payer: Self-pay | Admitting: Internal Medicine

## 2015-11-23 ENCOUNTER — Ambulatory Visit: Payer: Medicare Other | Admitting: Internal Medicine

## 2015-12-21 ENCOUNTER — Ambulatory Visit: Payer: Medicare Other | Admitting: Internal Medicine

## 2015-12-21 ENCOUNTER — Other Ambulatory Visit: Payer: Self-pay | Admitting: Internal Medicine

## 2015-12-21 DIAGNOSIS — M48062 Spinal stenosis, lumbar region with neurogenic claudication: Secondary | ICD-10-CM

## 2015-12-21 DIAGNOSIS — M5030 Other cervical disc degeneration, unspecified cervical region: Secondary | ICD-10-CM

## 2015-12-21 DIAGNOSIS — Z8546 Personal history of malignant neoplasm of prostate: Secondary | ICD-10-CM | POA: Insufficient documentation

## 2015-12-27 ENCOUNTER — Ambulatory Visit
Admission: RE | Admit: 2015-12-27 | Discharge: 2015-12-27 | Disposition: A | Discharge status: Home / Self Care | Payer: Managed Care, Other (non HMO) | Source: Ambulatory Visit | Attending: Internal Medicine | Admitting: Internal Medicine

## 2015-12-27 DIAGNOSIS — M5030 Other cervical disc degeneration, unspecified cervical region: Secondary | ICD-10-CM

## 2015-12-27 DIAGNOSIS — M48062 Spinal stenosis, lumbar region with neurogenic claudication: Secondary | ICD-10-CM

## 2016-01-11 ENCOUNTER — Ambulatory Visit: Payer: Medicare Other | Admitting: Internal Medicine

## 2016-01-19 ENCOUNTER — Encounter: Payer: Self-pay | Admitting: Internal Medicine

## 2016-01-19 ENCOUNTER — Ambulatory Visit (INDEPENDENT_AMBULATORY_CARE_PROVIDER_SITE_OTHER): Payer: Medicare Other | Admitting: Internal Medicine

## 2016-01-19 VITALS — BP 132/62 | HR 89 | Ht 70.0 in | Wt 244.0 lb

## 2016-01-19 DIAGNOSIS — J449 Chronic obstructive pulmonary disease, unspecified: Secondary | ICD-10-CM

## 2016-01-19 NOTE — Patient Instructions (Signed)
Chronic Obstructive Pulmonary Disease Chronic obstructive pulmonary disease (COPD) is a common lung condition in which airflow from the lungs is limited. COPD is a general term that can be used to describe many different lung problems that limit airflow, including both chronic bronchitis and emphysema. If you have COPD, your lung function will probably never return to normal, but there are measures you can take to improve lung function and make yourself feel better. CAUSES   Smoking (common).  Exposure to secondhand smoke.  Genetic problems.  Chronic inflammatory lung diseases or recurrent infections. SYMPTOMS  Shortness of breath, especially with physical activity.  Deep, persistent (chronic) cough with a large amount of thick mucus.  Wheezing.  Rapid breaths (tachypnea).  Gray or bluish discoloration (cyanosis) of the skin, especially in your fingers, toes, or lips.  Fatigue.  Weight loss.  Frequent infections or episodes when breathing symptoms become much worse (exacerbations).  Chest tightness. DIAGNOSIS Your health care provider will take a medical history and perform a physical examination to diagnose COPD. Additional tests for COPD may include:  Lung (pulmonary) function tests.  Chest X-ray.  CT scan.  Blood tests. TREATMENT  Treatment for COPD may include:  Inhaler and nebulizer medicines. These help manage the symptoms of COPD and make your breathing more comfortable.  Supplemental oxygen. Supplemental oxygen is only helpful if you have a low oxygen level in your blood.  Exercise and physical activity. These are beneficial for nearly all people with COPD.  Lung surgery or transplant.  Nutrition therapy to gain weight, if you are underweight.  Pulmonary rehabilitation. This may involve working with a team of health care providers and specialists, such as respiratory, occupational, and physical therapists. HOME CARE INSTRUCTIONS  Take all medicines  (inhaled or pills) as directed by your health care provider.  Avoid over-the-counter medicines or cough syrups that dry up your airway (such as antihistamines) and slow down the elimination of secretions unless instructed otherwise by your health care provider.  If you are a smoker, the most important thing that you can do is stop smoking. Continuing to smoke will cause further lung damage and breathing trouble. Ask your health care provider for help with quitting smoking. He or she can direct you to community resources or hospitals that provide support.  Avoid exposure to irritants such as smoke, chemicals, and fumes that aggravate your breathing.  Use oxygen therapy and pulmonary rehabilitation if directed by your health care provider. If you require home oxygen therapy, ask your health care provider whether you should purchase a pulse oximeter to measure your oxygen level at home.  Avoid contact with individuals who have a contagious illness.  Avoid extreme temperature and humidity changes.  Eat healthy foods. Eating smaller, more frequent meals and resting before meals may help you maintain your strength.  Stay active, but balance activity with periods of rest. Exercise and physical activity will help you maintain your ability to do things you want to do.  Preventing infection and hospitalization is very important when you have COPD. Make sure to receive all the vaccines your health care provider recommends, especially the pneumococcal and influenza vaccines. Ask your health care provider whether you need a pneumonia vaccine.  Learn and use relaxation techniques to manage stress.  Learn and use controlled breathing techniques as directed by your health care provider. Controlled breathing techniques include:  Pursed lip breathing. Start by breathing in (inhaling) through your nose for 1 second. Then, purse your lips as if you were   going to whistle and breathe out (exhale) through the  pursed lips for 2 seconds.  Diaphragmatic breathing. Start by putting one hand on your abdomen just above your waist. Inhale slowly through your nose. The hand on your abdomen should move out. Then purse your lips and exhale slowly. You should be able to feel the hand on your abdomen moving in as you exhale.  Learn and use controlled coughing to clear mucus from your lungs. Controlled coughing is a series of short, progressive coughs. The steps of controlled coughing are: 1. Lean your head slightly forward. 2. Breathe in deeply using diaphragmatic breathing. 3. Try to hold your breath for 3 seconds. 4. Keep your mouth slightly open while coughing twice. 5. Spit any mucus out into a tissue. 6. Rest and repeat the steps once or twice as needed. SEEK MEDICAL CARE IF:  You are coughing up more mucus than usual.  There is a change in the color or thickness of your mucus.  Your breathing is more labored than usual.  Your breathing is faster than usual. SEEK IMMEDIATE MEDICAL CARE IF:  You have shortness of breath while you are resting.  You have shortness of breath that prevents you from:  Being able to talk.  Performing your usual physical activities.  You have chest pain lasting longer than 5 minutes.  Your skin color is more cyanotic than usual.  You measure low oxygen saturations for longer than 5 minutes with a pulse oximeter. MAKE SURE YOU:  Understand these instructions.  Will watch your condition.  Will get help right away if you are not doing well or get worse.   This information is not intended to replace advice given to you by your health care provider. Make sure you discuss any questions you have with your health care provider.   Document Released: 06/08/2005 Document Revised: 09/19/2014 Document Reviewed: 04/25/2013 Elsevier Interactive Patient Education 2016 [employer]  

## 2016-01-19 NOTE — Progress Notes (Signed)
[facility] Consultation      Date: 01/19/2016,   MRN# [id number] Francisco [name] 08-Jun-1946 Code Status:  Hosp day:@LENGTHOFSTAYDAYS @ Referring MD: @ATDPROV @     PCP:      AdmissionWeight: 244 lb (110.678 kg)                 CurrentWeight: 244 lb (110.678 kg) Francisco [name] is a 70 y.o. 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  aspirin 81 MG tablet, Take 81 mg by mouth daily., Disp: , Rfl:  .  bisacodyl (DULCOLAX) 5 MG EC tablet, Take 5 mg by mouth daily., Disp: , Rfl:  .  gabapentin (NEURONTIN) 300 MG capsule, Take 300 mg by mouth 2 (two) times daily., Disp: , Rfl:  .  HYDROcodone-acetaminophen (NORCO) 10-325 MG per tablet, Take 1-2 tablets by mouth every 4 (four) hours as needed for moderate pain., Disp: 60 tablet, Rfl: 0 .  levothyroxine (SYNTHROID, LEVOTHROID) 88 MCG tablet, Take 88 mcg by mouth daily.  , Disp: , Rfl:  .  losartan (COZAAR) 50 MG tablet, Take 50 mg by mouth 2 (two) times daily. , Disp: , Rfl:  .  mirabegron ER (MYRBETRIQ) 25 MG TB24 tablet, Take 50 mg by mouth daily., Disp: , Rfl:  .  montelukast (SINGULAIR) 10 MG tablet, Take 10 mg by mouth at bedtime., Disp: , Rfl:  .  pantoprazole (PROTONIX) 40 MG tablet, Take 40 mg by mouth daily., Disp: , Rfl:  .  predniSONE (DELTASONE) 20 MG tablet, Take 40 mg by mouth every other day., Disp: , Rfl:  .  pyridostigmine (MESTINON) 60 MG tablet, Take 120 mg  by mouth daily., Disp: , Rfl:  .  SPIRIVA HANDIHALER 18 MCG inhalation capsule, PLACE 1 CAPSULE (18 MCG TOTAL) INTO INHALER AND INHALE DAILY., Disp: 30 capsule, Rfl: 1 .  Umeclidinium-Vilanterol (ANORO ELLIPTA) 62.5-25 MCG/INH AEPB, Inhale 1 puff into the lungs daily. Reported on 01/19/2016, Disp: , Rfl:     ALLERGIES   Codeine     REVIEW OF SYSTEMS   Review of Systems  Constitutional: Negative for fever, chills, weight loss, malaise/fatigue and diaphoresis.  HENT: Negative for congestion.   Respiratory: Negative for cough, hemoptysis, sputum production, shortness of breath and wheezing.   Cardiovascular: Negative for chest pain.  Gastrointestinal: Negative for heartburn, nausea, vomiting and abdominal pain.  Skin: Negative for rash.  Neurological: Negative for weakness.  All other systems reviewed and are negative.    VS: BP 132/62 mmHg  Pulse 89  Ht 5\' 10"  (1.778 m)  Wt 244 lb (110.678 kg)  BMI 35.01 kg/m2  SpO2 98%     PHYSICAL EXAM  Physical Exam  Constitutional: He is oriented to person, place, and time. He appears well-developed and well-nourished. No distress.  HENT:  Mouth/Throat: No oropharyngeal exudate.  Cardiovascular: Normal rate, regular rhythm and normal heart sounds.   No murmur  heard. Pulmonary/Chest: No respiratory distress. He has no wheezes.  Neurological: He is alert and oriented to person, place, and time.  Skin: He is not diaphoretic.  Psychiatric: He has a normal mood and affect.         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  Patient is satisfied with Plan of action and management.   Corrin Parker, M.D.  [facility]  Medical Director [address] Director [facility] Cardio-Pulmonary Department

## 2016-02-09 ENCOUNTER — Telehealth: Payer: Self-pay | Admitting: Internal Medicine

## 2016-02-09 NOTE — Telephone Encounter (Signed)
Last SMW faxed to [facility]. Nothing further needed.

## 2016-02-19 ENCOUNTER — Ambulatory Visit (INDEPENDENT_AMBULATORY_CARE_PROVIDER_SITE_OTHER): Payer: Managed Care, Other (non HMO) | Admitting: Internal Medicine

## 2016-02-19 ENCOUNTER — Encounter: Payer: Self-pay | Admitting: Internal Medicine

## 2016-02-19 DIAGNOSIS — J449 Chronic obstructive pulmonary disease, unspecified: Secondary | ICD-10-CM | POA: Diagnosis not present

## 2016-02-19 NOTE — Progress Notes (Signed)
[facility] Consultation      Date: 02/19/2016,   MRN# [id number] Francisco [name] 08/22/1946 Code Status:  Hosp day:@LENGTHOFSTAYDAYS @ Referring MD: @ATDPROV @     PCP:      Admission                  Current  Francisco [name] is a 70 y.o. 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  aspirin 81 MG tablet, Take 81 mg by mouth every other day. , Disp: , Rfl:  .  gabapentin (NEURONTIN) 300 MG capsule, Take 300 mg by mouth 2 (two) times daily., Disp: , Rfl:  .  levothyroxine (SYNTHROID, LEVOTHROID) 88 MCG tablet, Take 88 mcg by mouth daily.  , Disp: , Rfl:  .  losartan (COZAAR) 50 MG tablet, Take 50 mg by mouth 2 (two) times daily. , Disp: , Rfl:  .  mirabegron ER (MYRBETRIQ) 25 MG TB24 tablet, Take 50 mg by mouth daily., Disp: , Rfl:  .  montelukast (SINGULAIR) 10 MG tablet, Take 10 mg by mouth at bedtime., Disp: , Rfl:  .  OXYCODONE ER PO, Take 10-325 mg by mouth as needed. , Disp: , Rfl:  .  pantoprazole (PROTONIX) 40 MG tablet, Take 40 mg by mouth daily., Disp: , Rfl:  .  predniSONE (DELTASONE) 20 MG tablet, Take 40 mg by mouth every other day., Disp: , Rfl:  .  pyridostigmine (MESTINON) 60 MG tablet, Take 60 mg by mouth daily. , Disp: , Rfl:  .  SPIRIVA HANDIHALER 18 MCG inhalation capsule, PLACE 1 CAPSULE (18 MCG TOTAL) INTO INHALER AND INHALE DAILY., Disp: 30 capsule, Rfl:  1    ALLERGIES   Codeine     REVIEW OF SYSTEMS   Review of Systems  Constitutional: Negative for fever, chills, weight loss, malaise/fatigue and diaphoresis.  HENT: Negative for congestion.   Respiratory: Negative for cough, hemoptysis, sputum production, shortness of breath and wheezing.   Cardiovascular: Negative for chest pain.  Gastrointestinal: Negative for heartburn, nausea, vomiting and abdominal pain.  Skin: Negative for rash.  Neurological: Negative for weakness.  All other systems reviewed and are negative.      PHYSICAL EXAM  Physical Exam  Constitutional: He is oriented to person, place, and time. He appears well-developed and well-nourished. No distress.  HENT:  Mouth/Throat: No oropharyngeal exudate.  Cardiovascular: Normal rate, regular rhythm and normal heart sounds.   No murmur heard. Pulmonary/Chest: No respiratory distress. He has no wheezes.  Neurological: He is alert and oriented to person, place, and time.  Skin: He is not diaphoretic.  Psychiatric: He has a normal mood and affect.         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  Patient is satisfied with Plan of action and management.   Corrin Parker, M.D.  [facility]  Medical Director [place] Director [facility] Cardio-Pulmonary Department

## 2016-02-19 NOTE — Patient Instructions (Signed)
Chronic Obstructive Pulmonary Disease Chronic obstructive pulmonary disease (COPD) is a common lung condition in which airflow from the lungs is limited. COPD is a general term that can be used to describe many different lung problems that limit airflow, including both chronic bronchitis and emphysema. If you have COPD, your lung function will probably never return to normal, but there are measures you can take to improve lung function and make yourself feel better. CAUSES   Smoking (common).  Exposure to secondhand smoke.  Genetic problems.  Chronic inflammatory lung diseases or recurrent infections. SYMPTOMS  Shortness of breath, especially with physical activity.  Deep, persistent (chronic) cough with a large amount of thick mucus.  Wheezing.  Rapid breaths (tachypnea).  Gray or bluish discoloration (cyanosis) of the skin, especially in your fingers, toes, or lips.  Fatigue.  Weight loss.  Frequent infections or episodes when breathing symptoms become much worse (exacerbations).  Chest tightness. DIAGNOSIS Your health care provider will take a medical history and perform a physical examination to diagnose COPD. Additional tests for COPD may include:  Lung (pulmonary) function tests.  Chest X-ray.  CT scan.  Blood tests. TREATMENT  Treatment for COPD may include:  Inhaler and nebulizer medicines. These help manage the symptoms of COPD and make your breathing more comfortable.  Supplemental oxygen. Supplemental oxygen is only helpful if you have a low oxygen level in your blood.  Exercise and physical activity. These are beneficial for nearly all people with COPD.  Lung surgery or transplant.  Nutrition therapy to gain weight, if you are underweight.  Pulmonary rehabilitation. This may involve working with a team of health care providers and specialists, such as respiratory, occupational, and physical therapists. HOME CARE INSTRUCTIONS  Take all medicines  (inhaled or pills) as directed by your health care provider.  Avoid over-the-counter medicines or cough syrups that dry up your airway (such as antihistamines) and slow down the elimination of secretions unless instructed otherwise by your health care provider.  If you are a smoker, the most important thing that you can do is stop smoking. Continuing to smoke will cause further lung damage and breathing trouble. Ask your health care provider for help with quitting smoking. He or she can direct you to community resources or hospitals that provide support.  Avoid exposure to irritants such as smoke, chemicals, and fumes that aggravate your breathing.  Use oxygen therapy and pulmonary rehabilitation if directed by your health care provider. If you require home oxygen therapy, ask your health care provider whether you should purchase a pulse oximeter to measure your oxygen level at home.  Avoid contact with individuals who have a contagious illness.  Avoid extreme temperature and humidity changes.  Eat healthy foods. Eating smaller, more frequent meals and resting before meals may help you maintain your strength.  Stay active, but balance activity with periods of rest. Exercise and physical activity will help you maintain your ability to do things you want to do.  Preventing infection and hospitalization is very important when you have COPD. Make sure to receive all the vaccines your health care provider recommends, especially the pneumococcal and influenza vaccines. Ask your health care provider whether you need a pneumonia vaccine.  Learn and use relaxation techniques to manage stress.  Learn and use controlled breathing techniques as directed by your health care provider. Controlled breathing techniques include:  Pursed lip breathing. Start by breathing in (inhaling) through your nose for 1 second. Then, purse your lips as if you were   going to whistle and breathe out (exhale) through the  pursed lips for 2 seconds.  Diaphragmatic breathing. Start by putting one hand on your abdomen just above your waist. Inhale slowly through your nose. The hand on your abdomen should move out. Then purse your lips and exhale slowly. You should be able to feel the hand on your abdomen moving in as you exhale.  Learn and use controlled coughing to clear mucus from your lungs. Controlled coughing is a series of short, progressive coughs. The steps of controlled coughing are: 1. Lean your head slightly forward. 2. Breathe in deeply using diaphragmatic breathing. 3. Try to hold your breath for 3 seconds. 4. Keep your mouth slightly open while coughing twice. 5. Spit any mucus out into a tissue. 6. Rest and repeat the steps once or twice as needed. SEEK MEDICAL CARE IF:  You are coughing up more mucus than usual.  There is a change in the color or thickness of your mucus.  Your breathing is more labored than usual.  Your breathing is faster than usual. SEEK IMMEDIATE MEDICAL CARE IF:  You have shortness of breath while you are resting.  You have shortness of breath that prevents you from:  Being able to talk.  Performing your usual physical activities.  You have chest pain lasting longer than 5 minutes.  Your skin color is more cyanotic than usual.  You measure low oxygen saturations for longer than 5 minutes with a pulse oximeter. MAKE SURE YOU:  Understand these instructions.  Will watch your condition.  Will get help right away if you are not doing well or get worse.   This information is not intended to replace advice given to you by your health care provider. Make sure you discuss any questions you have with your health care provider.   Document Released: 06/08/2005 Document Revised: 09/19/2014 Document Reviewed: 04/25/2013 Elsevier Interactive Patient Education 2016 [employer]  

## 2016-03-02 DIAGNOSIS — C4A9 Merkel cell carcinoma, unspecified: Secondary | ICD-10-CM | POA: Insufficient documentation

## 2016-03-18 DIAGNOSIS — M47816 Spondylosis without myelopathy or radiculopathy, lumbar region: Secondary | ICD-10-CM | POA: Insufficient documentation

## 2016-03-26 ENCOUNTER — Other Ambulatory Visit: Payer: Self-pay | Admitting: Internal Medicine

## 2016-04-01 ENCOUNTER — Telehealth: Payer: Self-pay | Admitting: *Deleted

## 2016-04-01 DIAGNOSIS — J449 Chronic obstructive pulmonary disease, unspecified: Secondary | ICD-10-CM

## 2016-04-01 NOTE — Telephone Encounter (Signed)
Spoke with DK in reference to a staff message received from [facility] stating pt wants to keep his O2 at night. In order to do this pt must have an ONO. Per DK order ONO to see if pt qualifies. Order placed. Nothing further needed.

## 2016-04-12 ENCOUNTER — Encounter: Payer: Self-pay | Admitting: Internal Medicine

## 2016-04-21 ENCOUNTER — Telehealth: Payer: Self-pay | Admitting: *Deleted

## 2016-04-21 NOTE — Telephone Encounter (Signed)
Informed pt of ONO results. States he does wear O2 at night now. Informed pt to make sure machine is on 1L per DK order. Informed to call back with any questions. Nothing further needed.

## 2016-05-10 ENCOUNTER — Ambulatory Visit: Payer: Managed Care, Other (non HMO) | Attending: Neurosurgery

## 2016-05-10 VITALS — BP 123/64 | HR 79

## 2016-05-10 DIAGNOSIS — R262 Difficulty in walking, not elsewhere classified: Secondary | ICD-10-CM | POA: Diagnosis present

## 2016-05-10 DIAGNOSIS — M6281 Muscle weakness (generalized): Secondary | ICD-10-CM | POA: Insufficient documentation

## 2016-05-10 DIAGNOSIS — M5416 Radiculopathy, lumbar region: Secondary | ICD-10-CM | POA: Diagnosis present

## 2016-05-10 NOTE — Patient Instructions (Signed)
     Standing holding onto something sturdy for support:   Place one foot onto the 1st step.   Squeeze your rear end muscles together.    Hold for 5 seconds.    Repeat 10 times.   Perform 3 sets daily.      Repeat for your other side.    Clamshell   Please count out loud (for breaths)  No band yet.    Lie on side with hips and knees bent. Raise top knee up, squeezing glutes. Keep feet together.  __5_ reps per set each side, _3__ sets per day   Copyright  VHI. All rights reserved.

## 2016-05-10 NOTE — Therapy (Addendum)
[facility] [address] [place], [address] Phone: [phone]   Fax:  [phone]  Physical Therapy Evaluation  Patient Details  Name: ENGLISH COIT MRN: [id number] Date of Birth: 1945-12-25 Referring Provider: Mallie Mussel A. Pool, MD  Encounter Date: 05/10/2016      PT End of Session - 05/10/16 0834    Visit Number 1   Number of Visits 13   Date for PT Re-Evaluation 06/23/16   Authorization Type 1   Authorization Time Period of 10   PT Start Time 0834   PT Stop Time 0931   PT Time Calculation (min) 57 min   Activity Tolerance Patient tolerated treatment well   Behavior During Therapy [facility] for tasks assessed/performed      Past Medical History:  Diagnosis Date  . Bronchitis, chronic ([place])   . 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 COPD (chronic obstructive pulmonary disease) ([place])   . Hypertension    doesn't have a cardiologist;pt is maintained by medical md for htn;[facility]  . Hypothyroidism    pt takes Levothyroxine daily  . Myasthenia gravis, adult form ([place])   . Shortness of breath    Lung MD- Dr Darlin Coco  . Sleep apnea    do not use CPAP every night    Past Surgical History:  Procedure Laterality Date  . ANTERIOR CERVICAL DECOMP/DISCECTOMY FUSION  07/18/2011   Procedure: ANTERIOR CERVICAL DECOMPRESSION/DISCECTOMY FUSION 2 LEVELS;  Surgeon: Cooper Render Pool;  Location: [facility];  Service: Neurosurgery;  Laterality: N/A;  cervical five-six, cervical six-seven anterior cervical discectomy and fusion  . BACK SURGERY     in [place]     2005 at [facility]  . HERNIA REPAIR Left    inguinal hernia repair in 1997  . LUMBAR LAMINECTOMY/DECOMPRESSION MICRODISCECTOMY Left 02/24/2014   Procedure: LUMBAR LAMINECTOMY/DECOMPRESSION MICRODISCECTOMY LUMBAR THREE-FOUR,  FOUR-FIVE, LEFT FIVE-SACRAL ONE ;  Surgeon: Charlie Pitter, MD;  Location: [facility];  Service: Neurosurgery;  Laterality: Left;  LUMBAR LAMINECTOMY/DECOMPRESSION MICRODISCECTOMY LUMBAR THREE-FOUR, FOUR-FIVE, LEFT FIVE-SACRAL ONE   . PROSTATECTOMY  8/14   [facility] Dr Mare Ferrari     Vitals:   05/10/16 0841  BP: 123/64  Pulse: 79        Subjective Assessment - 05/10/16 0839    Subjective Low back 0/10 but has an ache when he stands too long or with increased activity since surgery.  L LE pain: 0/10 currently (pt sitting), 7/10 at most (walking, moving, standing too long)   Pertinent History Low back and L LE pain. Pt states that he recently had back surgery (about 3 months ago, lower lumbar) secondary to back pain and back arthritis which decreased his back pain. Currently has hip and leg pain (no hip and leg pain prior to his surgery) which appeared 4-5 weeks following his procedure. Pt also states using his SPC on his R side which helps. No falls for the past 6 months. 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    Patient Stated Goals "I would love to be able to decrease my pain level and walk for longer distances or stand longer."  Currently in Pain? Yes   Pain Score 0-No pain   Pain Descriptors / Indicators Burning;Pins and needles;Numbness;Aching   Pain Type Surgical pain   Aggravating Factors  walking about 120 ft, bending forward, standing greater 4-5 min   Pain Relieving Factors sitting, lying on his back or side (being off his feet)            [facility] PT Assessment - 05/10/16 0836      Assessment   Medical Diagnosis Spinal stenosis, lumbar region   Referring Provider Mallie Mussel A. Pool, MD   Onset Date/Surgical Date --  3 months ago (01/2016). Pt did not provide specific date   Prior Therapy Has not yet had PT for  his back      Precautions   Precaution Comments No lifting over 20 lbs     Restrictions   Other Position/Activity Restrictions no known restrictions     Balance Screen   Has the patient fallen in the past 6 months No   Has the patient had a decrease in activity level because of a fear of falling?  No   Is the patient reluctant to leave their home because of a fear of falling?  No     Home Environment   Additional Comments Pt lives in a 1 story home with his wife, 3 steps to enter, bilateral rail      Prior Function   Vocation Full time employment  Sales   Vocation Requirements PLOF: better able to ambulate longer distances and stand for a longer duration without L LE pain.      Observation/Other Assessments   Observations Low back surgical incision healed.    Modified Oswertry 46%     Posture/Postural Control   Posture Comments Bilateral foot prontation, R lateral lean, anterior pelvic tilt, decreased bilateral hip extension, bilaterally protracted shoulders     AROM   Overall AROM Comments PROM: hip extension: R -5 degrees, L -10 degrees     Strength   Right Hip Flexion 4+/5   Right Hip Extension 4/5  modified in L S/L   Right Hip ABduction 4+/5   Left Hip Flexion 4/5   Left Hip Extension 4/5  modified in R S/L   Left Hip ABduction 4-/5   Right Knee Flexion 4+/5   Right Knee Extension 5/5   Left Knee Flexion 4/5   Left Knee Extension 4+/5     Ambulation/Gait   Gait Comments antalgic, decreased stance L LE, L lateral lean during L LE stance phase       Objectives   There-ex  Directed patient with S/L L hip abduction 5x2. Cues for proper pelvic and thigh position. S/L clamshell L LE 5x3 SLS on L LE with R foot on first step 10x5 seconds   Improved exercise technique, movement at target joints, use of target muscles after mod verbal, visual, tactile cues.                           PT Education - 05/10/16 2047    Education provided  Yes   Education Details ther-ex, HEP, plan of care   Person(s) Educated Patient   Methods Explanation;Demonstration;Tactile cues;Verbal cues;Handout   Comprehension Returned demonstration;Verbalized understanding             PT Long Term Goals - 05/10/16 1044      PT LONG TERM GOAL #1   Title Patient will have a decrease in L LE pain  to 2/10 or less at worst to promote ability to stand, and walk for a longer duration as well as improve ability to perform garden work.    Baseline 7/10 L LE pain at most   Time 6   Period Weeks   Status New     PT LONG TERM GOAL #2   Title Patient will improve bilateral hip extension PROM by at least 5 degrees to promote ability to perform standing tasks, ambulate longer distances with less L LE symptoms.    Baseline PROM: -5 degrees R hip extension, -10 degrees L hip extension   Time 6   Period Weeks   Status New     PT LONG TERM GOAL #3   Title Patient will improve bilateral glute med and max strength by at least 1/2 MMT grade to promote ability to ambulate longer distances and stand for a longer duration with less L LE symptoms.    Time 6   Period Weeks   Status New     PT LONG TERM GOAL #4   Title Patient will improve his Modified Oswestry Low Back Pain Disability Questionnaire by at least 10% as a demonstration of improved function.    Baseline 46%   Time 6   Period Weeks               Plan - 05/11/2016 1034    Clinical Impression Statement Patient is a 70 year old male who came to physical therapy secondary to L LE pain. He also presents with recent low back surgery, altered gait pattern and posture, bilateral LE weakness L > R, and difficulty performing functional tasks such as walking, standing, and performing chores such as gardening. Patient will benefit from skilled physical therapy services to address the aforementioned deficits.    Rehab Potential Good   Clinical Impairments Affecting Rehab Potential age, pain, weakness    PT Frequency 2x / week   PT Duration 6 weeks   PT Treatment/Interventions Therapeutic exercise;Therapeutic activities;Manual techniques;Patient/family education;Neuromuscular re-education;Balance training;Gait training;Aquatic Therapy   PT Next Visit Plan Hip strengthening, lumbopelvic stabilty, gait training   Consulted and Agree with Plan of Care Patient      Patient will benefit from skilled therapeutic intervention in order to improve the following deficits and impairments:  Pain, Improper body mechanics, Postural dysfunction, Decreased strength, Difficulty walking  Visit Diagnosis: Radiculopathy, lumbar region - Plan: PT plan of care cert/re-cert  Muscle weakness (generalized) - Plan: PT plan of care cert/re-cert  Difficulty in walking, not elsewhere classified - Plan: PT plan of care cert/re-cert       G-Codes - 05-11-16 1054    Functional Assessment Tool Used Modified Oswestry Low Back Pain Disability Questionnaire, clinical presentation, patient interview   Functional Limitation Mobility: Walking and moving around   Mobility: Walking and Moving Around Current Status [id number]) At least 40 percent but less than 60 percent impaired, limited or restricted   Mobility: Walking and Moving Around Goal Status [phone]) At least 1 percent but less than 20 percent impaired, limited or restricted      Problem List Patient Active Problem List   Diagnosis Date Noted  . COPD (chronic obstructive pulmonary disease) ([place]) 05/26/2015  . Myasthenia gravis ([place]) 05/26/2015  . Spinal stenosis, lumbar region, with neurogenic claudication 02/24/2014  . Lumbosacral stenosis with neurogenic claudication 02/24/2014   Thank you for your referral.   Joneen Boers PT, DPT   May 11, 2016, 8:53 PM  [facility] [address], [place], [address] Phone: [phone]   Fax:  [phone]  Name: MYKE MAMMONE MRN: [id number] Date  of Birth: Nov 01, 1945

## 2016-05-31 ENCOUNTER — Ambulatory Visit: Payer: Managed Care, Other (non HMO) | Attending: Neurosurgery

## 2016-05-31 DIAGNOSIS — M6281 Muscle weakness (generalized): Secondary | ICD-10-CM | POA: Diagnosis not present

## 2016-05-31 DIAGNOSIS — M5416 Radiculopathy, lumbar region: Secondary | ICD-10-CM

## 2016-05-31 DIAGNOSIS — R262 Difficulty in walking, not elsewhere classified: Secondary | ICD-10-CM | POA: Diagnosis present

## 2016-05-31 NOTE — Patient Instructions (Signed)
   Transversus abdominis contraction.    Pretend that there is a string attached from one side of your pelvis to the other.    Tighten that "string."   Hold for 5 seconds.   Perform throughout the day.    Also tighten your pelvic floor muscles at the same time.

## 2016-05-31 NOTE — Therapy (Signed)
[facility] [address] [place], [address] Phone: [phone]   Fax:  [phone]  Physical Therapy Treatment  Patient Details  Name: Francisco [name] MRN: [id number] Date of Birth: May 18, 1946 Referring Provider: Mallie Mussel A. Pool, MD  Encounter Date: 05/31/2016      PT End of Session - 05/31/16 0900    Visit Number 2   Number of Visits 13   Date for PT Re-Evaluation 06/23/16   Authorization Type 2   Authorization Time Period of 10   PT Start Time 0900   PT Stop Time 0944   PT Time Calculation (min) 44 min   Activity Tolerance Patient tolerated treatment well   Behavior During Therapy [facility] for tasks assessed/performed      Past Medical History:  Diagnosis Date  . Bronchitis, chronic ([place])   . 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 COPD (chronic obstructive pulmonary disease) ([place])   . Hypertension    doesn't have a cardiologist;pt is maintained by medical md for htn;requ[facility] from [facility]  . Hypothyroidism    pt takes Levothyroxine daily  . Myasthenia gravis, adult form ([place])   . Shortness of breath    Lung MD- Dr Darlin Coco  . Sleep apnea    do not use CPAP every night    Past Surgical History:  Procedure Laterality Date  . ANTERIOR CERVICAL DECOMP/DISCECTOMY FUSION  07/18/2011   Procedure: ANTERIOR CERVICAL DECOMPRESSION/DISCECTOMY FUSION 2 LEVELS;  Surgeon: Cooper Render Pool;  Location: [facility];  Service: Neurosurgery;  Laterality: N/A;  cervical five-six, cervical six-seven anterior cervical discectomy and fusion  . BACK SURGERY     in [place]     2005 at [facility]  . HERNIA REPAIR Left    inguinal hernia repair in 1997  . LUMBAR LAMINECTOMY/DECOMPRESSION MICRODISCECTOMY Left 02/24/2014   Procedure: LUMBAR LAMINECTOMY/DECOMPRESSION MICRODISCECTOMY LUMBAR THREE-FOUR,  FOUR-FIVE, LEFT FIVE-SACRAL ONE ;  Surgeon: Charlie Pitter, MD;  Location: [facility];  Service: Neurosurgery;  Laterality: Left;  LUMBAR LAMINECTOMY/DECOMPRESSION MICRODISCECTOMY LUMBAR THREE-FOUR, FOUR-FIVE, LEFT FIVE-SACRAL ONE   . PROSTATECTOMY  8/14   [facility] Dr Mare Ferrari     There were no vitals filed for this visit.      Subjective Assessment - 05/31/16 0902    Subjective I believe it's some better. I do not get any back ache or pain unless standing up and walking for about 30 min. Gets a little ache which goes away after sitting. Yesterday I had a really good day. This morning my legs are not weak like they ordinarily are. No back or bilateral LE pain currenlty.  8/10 bilateral LE pain at most for the past 5 days.    Pertinent History Low back and L LE pain. Pt states that he recently had back surgery (about 3 months ago, lower lumbar) secondary to back pain and back arthritis which decreased his back pain. Currently has hip and leg pain (no hip and leg pain prior to his surgery) which appeared 4-5 weeks following his procedure. Pt also states using his SPC on his R side which helps. No falls for the past 6 months. 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    Patient Stated Goals "I would love to be able to decrease my pain level and walk for longer distances or stand longer."   Currently in Pain? No/denies   Pain Score 0-No pain            [facility] PT Assessment - 05/31/16 0922      Assessment   Next MD Visit Friday 06/03/2016                             PT Education - 05/31/16 0921    Education provided Yes   Education Details ther-ex, HEP   Person(s) Educated Patient   Methods Explanation;Demonstration;Tactile cues;Verbal cues;Handout   Comprehension Returned demonstration;Verbalized  understanding       Objectives   There-ex  Directed patient with S/L  hip abduction 5x3 each side. R lateral hip and leg pulling sensation when performing exercise for R side which eases with rest.  Supine transversus abdominis contraction 10x2 with 5 seconds   Then with pelvic floor contractions 10x2 with 5 seconds   Then with hip fallouts 5x3 each LE. Difficulty with pelvic control during thigh movements resulting in ipsilateral pelvic rotation. Increased time secondary to emphasis on quality of movement.      Standing alternating toe taps onto 1st regular step with bilateral UE assist 5x,   Then with one UE assist 5x2, emphasis on lumbopelvic control   L anterior thigh ache which decreased with seated trunk flexion x15 seconds  Standing L hip flexor stretch 15 seconds x 3   Improved exercise technique, movement at target joints, use of target muscles after mod verbal, visual, tactile cues.     Difficulty with lumbopelvic control with supine hip fallouts. R lateral hip and leg pulling sensation (around L 5 dermatome) during S/L R hip abduction exercise which eased with rest. Improved pelvic control with standing exercise.             PT Long Term Goals - 05/10/16 1044      PT LONG TERM GOAL #1   Title Patient will have a decrease in L LE pain to 2/10 or less at worst to promote ability to stand, and walk for a longer duration as well as improve ability to perform garden work.    Baseline 7/10 L LE pain at most   Time 6   Period Weeks   Status New     PT LONG TERM GOAL #2   Title Patient will improve bilateral hip extension PROM by at least 5 degrees to promote ability to perform standing tasks, ambulate longer distances with less L LE symptoms.    Baseline PROM: -5 degrees R hip extension, -10 degrees L hip extension   Time 6   Period Weeks   Status New     PT LONG TERM GOAL #3   Title Patient will improve bilateral glute med and max strength by at least 1/2  MMT grade to promote ability to ambulate longer distances and stand for a longer duration with less L LE symptoms.    Time 6   Period Weeks   Status New     PT LONG TERM GOAL #4   Title Patient will improve his Modified Oswestry Low Back Pain Disability Questionnaire by at least 10% as a demonstration of improved function.    Baseline 46%   Time 6   Period Weeks  Plan - 05/31/16 0859    Clinical Impression Statement Difficulty with lumbopelvic control with supine hip fallouts. R lateral hip and leg pulling sensation (around L 5 dermatome) during S/L R hip abduction exercise which eased with rest. Improved pelvic control with standing exercise.    Rehab Potential Good   Clinical Impairments Affecting Rehab Potential age, pain, weakness   PT Frequency 2x / week   PT Duration 6 weeks   PT Treatment/Interventions Therapeutic exercise;Therapeutic activities;Manual techniques;Patient/family education;Neuromuscular re-education;Balance training;Gait training;Aquatic Therapy   PT Next Visit Plan Hip strengthening, lumbopelvic stabilty, gait training   Consulted and Agree with Plan of Care Patient      Patient will benefit from skilled therapeutic intervention in order to improve the following deficits and impairments:  Pain, Improper body mechanics, Postural dysfunction, Decreased strength, Difficulty walking  Visit Diagnosis: Muscle weakness (generalized)  Difficulty in walking, not elsewhere classified  Radiculopathy, lumbar region     Problem List Patient Active Problem List   Diagnosis Date Noted  . COPD (chronic obstructive pulmonary disease) ([place]) 05/26/2015  . Myasthenia gravis ([place]) 05/26/2015  . Spinal stenosis, lumbar region, with neurogenic claudication 02/24/2014  . Lumbosacral stenosis with neurogenic claudication 02/24/2014    Joneen Boers PT, DPT   05/31/2016, 8:21 PM  [facility] [address], [place], [address] Phone: [phone]   Fax:  [phone]  Name: Francisco [name] MRN: [id number] Date of Birth: 1946/02/27

## 2016-06-02 ENCOUNTER — Ambulatory Visit: Payer: Managed Care, Other (non HMO)

## 2016-06-02 DIAGNOSIS — M5416 Radiculopathy, lumbar region: Secondary | ICD-10-CM

## 2016-06-02 DIAGNOSIS — R262 Difficulty in walking, not elsewhere classified: Secondary | ICD-10-CM

## 2016-06-02 DIAGNOSIS — M6281 Muscle weakness (generalized): Secondary | ICD-10-CM | POA: Diagnosis not present

## 2016-06-02 NOTE — Patient Instructions (Signed)
Hip Flexor Stretch - Standing     Small to medium stretch.   Right leg behind, foot facing forward, left knee bent to 90. Place hands on wall. Bend right knee directly under hip. Squeeze buttock muscles and push hips forward. Hold position for 15 seconds. Repeat _3__ times. Do __3_ times per day. Perform for your other side.   Make sure you tuck your pelvis under to decrease low back extension pressure.   Copyright  VHI. All rights reserved.

## 2016-06-02 NOTE — Therapy (Signed)
[facility] [address] [place], [address] Phone: [phone]   Fax:  [phone]  Physical Therapy Treatment And Progress Report  Patient Details  Name: Francisco [name] MRN: [id number] Date of Birth: Oct 20, 1945 Referring Provider: Mallie Mussel A. Pool, MD  Encounter Date: 06/02/2016      PT End of Session - 06/02/16 0900    Visit Number 2   Number of Visits 13   Date for PT Re-Evaluation 06/23/16   Authorization Type 2   Authorization Time Period of 10   PT Start Time 0901   PT Stop Time 0945   PT Time Calculation (min) 44 min   Activity Tolerance Patient tolerated treatment well   Behavior During Therapy WFL for tasks assessed/performed      Past Medical History:  Diagnosis Date  . Bronchitis, chronic ([place])   . 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 COPD (chronic obstructive pulmonary disease) ([place])   . Hypertension    doesn't have a cardiologist;pt is maintained by medical md for htn;requested ekg/cxr from [facility]  . Hypothyroidism    pt takes Levothyroxine daily  . Myasthenia gravis, adult form ([place])   . Shortness of breath    Lung MD- Dr Darlin Coco  . Sleep apnea    do not use CPAP every night    Past Surgical History:  Procedure Laterality Date  . ANTERIOR CERVICAL DECOMP/DISCECTOMY FUSION  07/18/2011   Procedure: ANTERIOR CERVICAL DECOMPRESSION/DISCECTOMY FUSION 2 LEVELS;  Surgeon: Cooper Render Pool;  Location: [facility];  Service: Neurosurgery;  Laterality: N/A;  cervical five-six, cervical six-seven anterior cervical discectomy and fusion  . BACK SURGERY     in [place]     2005 at [facility]  . HERNIA REPAIR Left    inguinal hernia repair in 1997  . LUMBAR LAMINECTOMY/DECOMPRESSION MICRODISCECTOMY Left 02/24/2014   Procedure: LUMBAR LAMINECTOMY/DECOMPRESSION MICRODISCECTOMY LUMBAR  THREE-FOUR, FOUR-FIVE, LEFT FIVE-SACRAL ONE ;  Surgeon: Charlie Pitter, MD;  Location: [place] [facility];  Service: Neurosurgery;  Laterality: Left;  LUMBAR LAMINECTOMY/DECOMPRESSION MICRODISCECTOMY LUMBAR THREE-FOUR, FOUR-FIVE, LEFT FIVE-SACRAL ONE   . PROSTATECTOMY  8/14   [facility] Dr Mare Ferrari     There were no vitals filed for this visit.      Subjective Assessment - 06/02/16 0903    Subjective Pt states that his legs feel really weak today. Pt states doing a lot of standing and physical labor for about 7 hours. Pt was helping someone hook up a machine (no heavy lifting). Did a lot of bending which involved squatting, and  getting onto his knees. L hip was painful earlier this morning which eased off.  0/10 back pain, 6/10 L LE pain currently. Sees his back surgeon tomorrow in [place]. No R LE pain.  Sitting helps with the pain.    Pertinent History Low back and L LE pain. Pt states that he recently had back surgery (about 3 months ago, lower lumbar) secondary to back pain and back arthritis which decreased his back pain. Currently has hip and leg pain (no hip and leg pain prior to his surgery) which appeared 4-5 weeks following his procedure. Pt also states using his SPC on his R side which helps. No falls for the past 6 months. 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    Patient Stated Goals "I would love to be able to decrease my pain level and walk for longer distances or stand longer."   Currently in Pain? Yes   Pain Score 6   L LE pain.             [facility] PT Assessment - 06/02/16 0910      Observation/Other Assessments   Modified Oswertry 40%     PROM   Overall PROM Comments L hip extension: -10 degrees; R hip extension -15 degrees     Strength   Right Hip Flexion 4/5   Right Hip Extension 4/5  in S/L   Right  Hip ABduction 4+/5   Left Hip Flexion 4-/5   Left Hip Extension 4+/5  in S/L   Left Hip ABduction 4/5   Right Knee Flexion 4+/5   Right Knee Extension 5/5   Left Knee Flexion 4+/5   Left Knee Extension 5/5                             PT Education - 06/02/16 0940    Education provided Yes   Education Details ther-ex, progress/current status with LE strength, HEP   Person(s) Educated Patient   Methods Explanation;Demonstration;Tactile cues;Verbal cues;Handout   Comprehension Returned demonstration;Verbalized understanding        Objectives   There-ex  Directed patient with seated trunk flexion position 30 seconds. No pain   S/L L hip flexor stretch 20 seconds x 3. Performed by PT.  Manually resisted S/L hip extension, S/L hip abduction, seated hip flexion, seated knee flexion, seated knee extension 1x each way for each LE.  Reviewed progress/current status with LE strength and hip flexor mobility with pt.   S/L hip abduction 5x each LE  Pt states that when he sleeps on his side, no pillows between knees. Pt was recommended to place pillows between knees in S/L to help with back and LE comfort. Pt verbalized udnerstanding  Standing hip flexor stretch 15 seconds x 3 each LE  Reviewed and given as part of his HEP. Pt demonstrated and verbalized understanding.   Standing alternating toe taps onto first step 10x2 each LE, emphasis on pelvic control. Light touch assist.    Improved exercise technique, movement at target joints, use of target muscles after mod verbal, visual, tactile cues.       Pt demonstrates slight improvement in function (6% better with his Modified Oswestry Low Back Pain Disability Questionnaire), improved L hip abductor, glute max, hamstring and quadriceps strength since initial evaluation. Still demonstrates hip flexor tightness bilaterally. Decreaed L LE symptoms with trunk flexion related activities. Some weakness today  bilateral hip flexors compared to initial evaluation. Patient still demonstrates LE weakness, LE pain, difficulty with gait and performing functional tasks and would benefit from continued skilled physical therapy intervention to address the aforementioned deficits.            PT Long Term Goals - 06/02/16 1103      PT LONG TERM GOAL #1   Title Patient will have a decrease in L LE pain to 2/10 or less at worst to promote ability to stand, and walk for a longer duration as well as improve ability to perform garden work.    Time 6   Period Weeks  Status On-going     PT LONG TERM GOAL #2   Title Patient will improve bilateral hip extension PROM by at least 5 degrees to promote ability to perform standing tasks, ambulate longer distances with less L LE symptoms.    Baseline PROM: -5 degrees R hip extension, -10 degrees L hip extension; PROM: -15 degrees R hip extension, -10 degrees L hip extension (06/02/2016)    Time 6   Period Weeks   Status On-going     PT LONG TERM GOAL #3   Title Patient will improve bilateral glute med and max strength by at least 1/2 MMT grade to promote ability to ambulate longer distances and stand for a longer duration with less L LE symptoms.    Time 6   Period Weeks   Status Partially Met     PT LONG TERM GOAL #4   Title Patient will improve his Modified Oswestry Low Back Pain Disability Questionnaire by at least 10% as a demonstration of improved function.    Baseline 46%; 40% current score (06/02/2016)   Time 6   Period Weeks   Status On-going               Plan - 06/02/16 0859    Clinical Impression Statement Pt demonstrates slight improvement in function (6% better with his Modified Oswestry Low Back Pain Disability Questionnaire), improved L hip abductor, glute max, hamstring and quadriceps strength since initial evaluation. Still demonstrates hip flexor tightness bilaterally. Decreaed L LE symptoms with trunk flexion related activities.  Some weakness today bilateral hip flexors compared to initial evaluation. Patient still demonstrates LE weakness, LE pain, difficulty with gait and performing functional tasks and would benefit from continued skilled physical therapy intervention to address the aforementioned deficits.    Rehab Potential Good   Clinical Impairments Affecting Rehab Potential age, pain, weakness   PT Frequency 2x / week   PT Duration 6 weeks   PT Treatment/Interventions Therapeutic exercise;Therapeutic activities;Manual techniques;Patient/family education;Neuromuscular re-education;Balance training;Gait training;Aquatic Therapy   PT Next Visit Plan Hip strengthening, lumbopelvic stabilty, gait training   Consulted and Agree with Plan of Care Patient      Patient will benefit from skilled therapeutic intervention in order to improve the following deficits and impairments:  Pain, Improper body mechanics, Postural dysfunction, Decreased strength, Difficulty walking  Visit Diagnosis: Muscle weakness (generalized)  Difficulty in walking, not elsewhere classified  Radiculopathy, lumbar region     Problem List Patient Active Problem List   Diagnosis Date Noted  . COPD (chronic obstructive pulmonary disease) ([place]) 05/26/2015  . Myasthenia gravis ([place]) 05/26/2015  . Spinal stenosis, lumbar region, with neurogenic claudication 02/24/2014  . Lumbosacral stenosis with neurogenic claudication 02/24/2014   Thank you for your referral.  Joneen Boers PT, DPT   06/02/2016, 11:06 AM  [facility] [address], [place], [address] Phone: [phone]   Fax:  [phone]  Name: Francisco [name] MRN: [id number] Date of Birth: [birth year]

## 2016-06-06 ENCOUNTER — Ambulatory Visit: Payer: Managed Care, Other (non HMO)

## 2016-06-16 ENCOUNTER — Ambulatory Visit: Payer: Managed Care, Other (non HMO)

## 2016-06-20 ENCOUNTER — Ambulatory Visit: Payer: Managed Care, Other (non HMO)

## 2016-06-21 ENCOUNTER — Ambulatory Visit: Payer: Managed Care, Other (non HMO) | Attending: Neurosurgery

## 2016-06-21 DIAGNOSIS — M6281 Muscle weakness (generalized): Secondary | ICD-10-CM | POA: Insufficient documentation

## 2016-06-21 DIAGNOSIS — M5416 Radiculopathy, lumbar region: Secondary | ICD-10-CM | POA: Insufficient documentation

## 2016-06-21 DIAGNOSIS — R262 Difficulty in walking, not elsewhere classified: Secondary | ICD-10-CM

## 2016-06-21 NOTE — Therapy (Signed)
[facility] [address], [place], [address] Phone: [phone]   Fax:  [phone]  Physical Therapy Treatment  Patient Details  Name: Francisco [name] MRN: [id number] Date of Birth: 1946/04/23 Referring Provider: Mallie Mussel A. Pool, MD  Encounter Date: 06/21/2016      PT End of Session - 06/21/16 1120    Visit Number 3   Number of Visits 21   Date for PT Re-Evaluation 07/21/16   Authorization Type 3   Authorization Time Period of 10   PT Start Time 1120   PT Stop Time 1203   PT Time Calculation (min) 43 min   Activity Tolerance Patient tolerated treatment well   Behavior During Therapy WFL for tasks assessed/performed      Past Medical History:  Diagnosis Date  . Bronchitis, chronic ([place])   . 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 COPD (chronic obstructive pulmonary disease) ([place])   . Hypertension    doesn't have a cardiologist;pt is maintained by medical md for htn;[facility] from [facility]  . Hypothyroidism    pt takes Levothyroxine daily  . Myasthenia gravis, adult form ([place])   . Shortness of breath    Lung MD- Dr Darlin Coco  . Sleep apnea    do not use CPAP every night    Past Surgical History:  Procedure Laterality Date  . ANTERIOR CERVICAL DECOMP/DISCECTOMY FUSION  07/18/2011   Procedure: ANTERIOR CERVICAL DECOMPRESSION/DISCECTOMY FUSION 2 LEVELS;  Surgeon: Cooper Render Pool;  Location: [place] [facility]UR[facility] ORS;  Service: Neurosurgery;  Laterality: N/A;  cervical five-six, cervical six-seven anterior cervical discectomy and fusion  . BACK SURGERY     in [place]     2005 at [facility]  . HERNIA REPAIR Left    inguinal hernia repair in 1997  . LUMBAR LAMINECTOMY/DECOMPRESSION MICRODISCECTOMY Left 02/24/2014   Procedure: LUMBAR LAMINECTOMY/DECOMPRESSION MICRODISCECTOMY LUMBAR THREE-FOUR,  FOUR-FIVE, LEFT FIVE-SACRAL ONE ;  Surgeon: Charlie Pitter, MD;  Location: [facility] ORS;  Service: Neurosurgery;  Laterality: Left;  LUMBAR LAMINECTOMY/DECOMPRESSION MICRODISCECTOMY LUMBAR THREE-FOUR, FOUR-FIVE, LEFT FIVE-SACRAL ONE   . PROSTATECTOMY  8/14   [facility] Dr Mare Ferrari     There were no vitals filed for this visit.      Subjective Assessment - 06/21/16 1122    Subjective Pt states that his legs are weak and does not know why. It comes and goes. Had a doctor's appointment who said that the back surgery is healing well.  Had surgery to remove a basal cell carcinoma on 04/13/2016 (deep; still healing but about there per pt).   The area across the top of his L foot feels a little numb currently and comes and goes since the back surgery.  4/10 L LE pain with up and walking. No pain when sitting. 6/10 L LE pain at most for the past week. The hip stretch at the wall seems to help a lot.    Pertinent History Low back and L LE pain. Pt states that he recently had back surgery (about 3 months ago, lower lumbar) secondary to back pain and back arthritis which decreased his back pain. Currently has hip and leg pain (no hip and leg pain prior to his surgery) which appeared 4-5 weeks following his procedure. Pt also states using his SPC on his R side which helps. No falls for the  past 6 months. 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    Patient Stated Goals "I would love to be able to decrease my pain level and walk for longer distances or stand longer."   Currently in Pain? No/denies   Pain Score 0-No pain            [facility] PT Assessment - 06/21/16 1127      Observation/Other Assessments   Modified Oswertry 36%     PROM   Overall PROM Comments L hip extension: -2 degrees; R hip -13 degrees     Strength   Right Hip Extension  5/5  in S/L   Right Hip ABduction 5/5   Left Hip Extension 4+/5  in S/L   Left Hip ABduction 4/5                             PT Education - 06/21/16 1144    Education provided Yes   Education Details ther-ex, progress/current status    Person(s) Educated Patient   Methods Explanation;Demonstration;Tactile cues;Verbal cues   Comprehension Returned demonstration;Verbalized understanding        Objectives   There-ex  Directed patient with manually resisted S/L hip abduction, S/L hip extension 1-2x each way for each LE   Reviewed progress/current status with hip extension PROM and hip strength with pt.   Sit <> stand with emphasis on L LE use to promote L LE  strength 10x2  Forward step up onto 1st regular step with bilateral UE assist, using L LE 10x2  Standing alternating toe taps onto first step 10x each LE, emphasis on pelvic control. Light touch assist.   Then 4x2 each LE without UE assist   SLS on L LE with contralateral LE on 1st regular step 5x5 seconds with bilateral UE assist. Good glute muscle use felt but slight L lateral thigh discomfort afterwards. Decreases with rest  Standing bilateral shoulder extension to neutral resisting red band 10x2 to promote thoracic extension.   Therapeutic rest breaks secondary to fatigue.    Improved exercise technique, movement at target joints, use of target muscles after mod verbal, visual, tactile cues.    Pt demonstrates improved hip extension PROM, improved function and improved R glute med and max strength since last progress report ion 06/02/2016. Pt still demonstrates L LE pain, difficulty walking and performing functional tasks and would benefit from continued skilled physical therapy services to address the aforementioned deficits.                PT Long Term Goals - 06/21/16 1229      PT LONG TERM GOAL #1   Title Patient will have a decrease in L LE pain to 2/10 or less at worst  to promote ability to stand, and walk for a longer duration as well as improve ability to perform garden work.    Baseline 6/10 L LE pain at most for the past week (06/21/2016)   Time 4   Period Weeks   Status On-going     PT LONG TERM GOAL #2   Title Patient will improve bilateral hip extension PROM by at least 5 degrees to promote ability to perform standing tasks, ambulate longer distances with less L LE symptoms.  Baseline PROM: -5 degrees R hip extension, -10 degrees L hip extension; PROM: -15 degrees R hip extension, -10 degrees L hip extension (06/02/2016); L hip extension - 2 degrees, R hip extension -13 degrees (06/21/2016)    Time 4   Period Weeks   Status On-going     PT LONG TERM GOAL #3   Title Patient will improve bilateral glute med and max strength by at least 1/2 MMT grade to promote ability to ambulate longer distances and stand for a longer duration with less L LE symptoms.    Time 4   Period Weeks   Status Partially Met     PT LONG TERM GOAL #4   Title Patient will improve his Modified Oswestry Low Back Pain Disability Questionnaire by at least 10% as a demonstration of improved function.    Baseline 46%; 40% (06/02/2016); current score 36% (06/21/2016)   Time 4   Period Weeks   Status Achieved               Plan - 06/21/16 1153    Clinical Impression Statement Pt demonstrates improved hip extension PROM, improved function and improved R glute med and max strength since last progress report ion 06/02/2016. Pt still demonstrates L LE pain, difficulty walking and performing functional tasks and would benefit from continued skilled physical therapy services to address the aforementioned deficits.    Rehab Potential Good   Clinical Impairments Affecting Rehab Potential age, pain, weakness   PT Frequency 2x / week   PT Duration 4 weeks   PT Treatment/Interventions Therapeutic exercise;Therapeutic activities;Manual techniques;Patient/family  education;Neuromuscular re-education;Balance training;Gait training;Aquatic Therapy   PT Next Visit Plan Hip strengthening, lumbopelvic stabilty, gait training   Consulted and Agree with Plan of Care Patient      Patient will benefit from skilled therapeutic intervention in order to improve the following deficits and impairments:  Pain, Improper body mechanics, Postural dysfunction, Decreased strength, Difficulty walking  Visit Diagnosis: Radiculopathy, lumbar region - Plan: PT plan of care cert/re-cert  Muscle weakness (generalized) - Plan: PT plan of care cert/re-cert  Difficulty in walking, not elsewhere classified - Plan: PT plan of care cert/re-cert     Problem List Patient Active Problem List   Diagnosis Date Noted  . COPD (chronic obstructive pulmonary disease) ([place]) 05/26/2015  . Myasthenia gravis ([place]) 05/26/2015  . Spinal stenosis, lumbar region, with neurogenic claudication 02/24/2014  . Lumbosacral stenosis with neurogenic claudication ([place]) 02/24/2014    Joneen Boers PT, DPT   06/21/2016, 12:39 PM  [facility] [address] [place], [address] Phone: [phone]   Fax:  [phone]  Name: Francisco [name] MRN: [id number] Date of Birth: 07-30-1946

## 2016-06-27 ENCOUNTER — Ambulatory Visit: Payer: Managed Care, Other (non HMO)

## 2016-06-27 DIAGNOSIS — M5416 Radiculopathy, lumbar region: Secondary | ICD-10-CM | POA: Diagnosis not present

## 2016-06-27 DIAGNOSIS — R262 Difficulty in walking, not elsewhere classified: Secondary | ICD-10-CM

## 2016-06-27 DIAGNOSIS — M6281 Muscle weakness (generalized): Secondary | ICD-10-CM

## 2016-06-27 NOTE — Therapy (Signed)
[facility] [address] [place], [address] Phone: [phone]   Fax:  [phone]  Physical Therapy Treatment  Patient Details  Name: Francisco [name] MRN: [id number] Date of Birth: [birth year] Referring Provider: Mallie Mussel A. Pool, MD  Encounter Date: 06/27/2016      PT End of Session - 06/27/16 0900    Visit Number 4   Number of Visits 21   Date for PT Re-Evaluation 07/21/16   Authorization Type 4   Authorization Time Period of 10   PT Start Time 0900   PT Stop Time 0947   PT Time Calculation (min) 47 min   Equipment Utilized During Treatment Gait belt   Activity Tolerance Patient tolerated treatment well   Behavior During Therapy WFL for tasks assessed/performed      Past Medical History:  Diagnosis Date  . Bronchitis, chronic ([place])   . 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 COPD (chronic obstructive pulmonary disease) ([place])   . Hypertension    doesn't have a cardiologist;pt is maintained by medical md for htn;requested ekg/cxr from [facility]  . Hypothyroidism    pt takes Levothyroxine daily  . Myasthenia gravis, adult form ([place])   . Shortness of breath    Lung MD- Dr Darlin Coco  . Sleep apnea    do not use CPAP every night    Past Surgical History:  Procedure Laterality Date  . ANTERIOR CERVICAL DECOMP/DISCECTOMY FUSION  07/18/2011   Procedure: ANTERIOR CERVICAL DECOMPRESSION/DISCECTOMY FUSION 2 LEVELS;  Surgeon: Cooper Render Pool;  Location: [facility];  Service: Neurosurgery;  Laterality: N/A;  cervical five-six, cervical six-seven anterior cervical discectomy and fusion  . BACK SURGERY     in [place]     2005 at [facility]  . HERNIA REPAIR Left    inguinal hernia repair in 1997  . LUMBAR LAMINECTOMY/DECOMPRESSION MICRODISCECTOMY Left 02/24/2014   Procedure: LUMBAR  LAMINECTOMY/DECOMPRESSION MICRODISCECTOMY LUMBAR THREE-FOUR, FOUR-FIVE, LEFT FIVE-SACRAL ONE ;  Surgeon: Charlie Pitter, MD;  Location: [facility];  Service: Neurosurgery;  Laterality: Left;  LUMBAR LAMINECTOMY/DECOMPRESSION MICRODISCECTOMY LUMBAR THREE-FOUR, FOUR-FIVE, LEFT FIVE-SACRAL ONE   . PROSTATECTOMY  8/14   [facility] Dr Mare Ferrari     There were no vitals filed for this visit.      Subjective Assessment - 06/27/16 0902    Subjective Back feels pretty good. The R hip joint ached this morning but better now. No back or L LE pain today. Just a little weakness on his L side.  Has L LE symptoms this weekend. It comes and goes. Does not know what causes it. The symptoms just happened.    Pertinent History Low back and L LE pain. Pt states that he recently had back surgery (about 3 months ago, lower lumbar) secondary to back pain and back arthritis which decreased his back pain. Currently has hip and leg pain (no hip and leg pain prior to his surgery) which appeared 4-5 weeks following his procedure. Pt also states using his SPC on his R side which helps. No falls for the past 6 months. 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    Patient Stated  Goals "I would love to be able to decrease my pain level and walk for longer distances or stand longer."   Currently in Pain? No/denies   Pain Score 0-No pain                                 PT Education - 06/27/16 0906    Education provided Yes   Education Details ther-ex   Person(s) Educated Patient   Methods Explanation;Demonstration;Tactile cues;Verbal cues   Comprehension Returned demonstration;Verbalized understanding         Objectives   There-ex  Directed patient with Nustep seat 8, no arms, level 1 x 5 min. Min cues for exrcursion and  pace.  SLS on L LE with contralateral LE on 1st regular step 10x5 seconds with bilateral UE assist.   Standing alternating toe taps onto first step 10x each LE, emphasis on pelvic control. Light touch assist.   Sit <> stand with emphasis on L LE use to promote L LE  strength 10x2  Standing bilateral shoulder extension to neutral resisting red band 10x5 seconds, then 7x to promote thoracic extension.  Forward step up onto 1st regular step with bilateral UE assist, using L LE 10x2   Standing bilateral ankle DF/PF on rockerboard to promote LE flexibility. Good gastroc muscle stretch felt.     Side stepping 5 ft to the R, and 5 ft to the L 10x each way with bilateral UE assist from treadmill bars.  Forward wedding march 5 ft with one UE assist from treadmill bars 10x   Standing balance on balance stones tandem stance 30 seconds R foot forward, 30 seconds L foot forward. Light touch assist.      Improved exercise technique, movement at target joints, use of target muscles after mod verbal, visual, tactile cues.             PT Long Term Goals - 06/21/16 1229      PT LONG TERM GOAL #1   Title Patient will have a decrease in L LE pain to 2/10 or less at worst to promote ability to stand, and walk for a longer duration as well as improve ability to perform garden work.    Baseline 6/10 L LE pain at most for the past week (06/21/2016)   Time 4   Period Weeks   Status On-going     PT LONG TERM GOAL #2   Title Patient will improve bilateral hip extension PROM by at least 5 degrees to promote ability to perform standing tasks, ambulate longer distances with less L LE symptoms.    Baseline PROM: -5 degrees R hip extension, -10 degrees L hip extension; PROM: -15 degrees R hip extension, -10 degrees L hip extension (06/02/2016); L hip extension - 2 degrees, R hip extension -13 degrees (06/21/2016)    Time 4   Period Weeks   Status On-going     PT LONG TERM GOAL #3   Title Patient  will improve bilateral glute med and max strength by at least 1/2 MMT grade to promote ability to ambulate longer distances and stand for a longer duration with less L LE symptoms.    Time 4   Period Weeks   Status Partially Met     PT LONG TERM GOAL #4   Title Patient will improve his Modified Oswestry Low Back Pain Disability Questionnaire by at least 10% as a demonstration of improved function.  Baseline 46%; 40% (06/02/2016); current score 36% (06/21/2016)   Time 4   Period Weeks   Status Achieved               Plan - 06/27/16 0919    Clinical Impression Statement Pt demonstrates L LE weakness > R with exercises. Slight L lateral LE pulling sensation with SLS on L LE with R foot on 1st stair step with glute max squeeze which eased with rest. Patient tolerated session well without aggravation of symptoms. Continue working on LE strengthening and endurance L > R as well as exercises to promote thoracic mobility.     Rehab Potential Good   Clinical Impairments Affecting Rehab Potential age, pain, weakness   PT Frequency 2x / week   PT Duration 4 weeks   PT Treatment/Interventions Therapeutic exercise;Therapeutic activities;Manual techniques;Patient/family education;Neuromuscular re-education;Balance training;Gait training;Aquatic Therapy   PT Next Visit Plan Hip strengthening, lumbopelvic stabilty, gait training   Consulted and Agree with Plan of Care Patient      Patient will benefit from skilled therapeutic intervention in order to improve the following deficits and impairments:  Pain, Improper body mechanics, Postural dysfunction, Decreased strength, Difficulty walking  Visit Diagnosis: Muscle weakness (generalized)  Difficulty in walking, not elsewhere classified  Radiculopathy, lumbar region     Problem List Patient Active Problem List   Diagnosis Date Noted  . COPD (chronic obstructive pulmonary disease) ([place]) 05/26/2015  . Myasthenia gravis ([place]) 05/26/2015   . Spinal stenosis, lumbar region, with neurogenic claudication 02/24/2014  . Lumbosacral stenosis with neurogenic claudication ([place]) 02/24/2014   Joneen Boers PT, DPT   06/27/2016, 10:02 AM  [facility] [address] [place], [address] Phone: [phone]   Fax:  [phone]  Name: BARRINGTON WORLEY MRN: [id number] Date of Birth: Jul 27, 1946

## 2016-06-30 ENCOUNTER — Ambulatory Visit: Payer: Managed Care, Other (non HMO)

## 2016-06-30 DIAGNOSIS — R262 Difficulty in walking, not elsewhere classified: Secondary | ICD-10-CM

## 2016-06-30 DIAGNOSIS — M6281 Muscle weakness (generalized): Secondary | ICD-10-CM

## 2016-06-30 DIAGNOSIS — M5416 Radiculopathy, lumbar region: Secondary | ICD-10-CM

## 2016-06-30 NOTE — Patient Instructions (Addendum)
 (  Home) Extension: Thoracic With Lumbar Lock - Sitting    Sit with back against chair, knees bent, hands folded across (not shown).  Keep your chin tucked.  Extend trunk over chair back gently. Hold position for __5__ seconds. Repeat ___10  times per set. Do _3___ sets per session daily.   Copyright  VHI. All rights reserved.    Strengthening: Resisted Extension    Hold band, one in each hand,  arms forward. Squeeze shoulder blades together to feel extension at your thoracic spine.  Pull arms back to neutral, elbow straight. Hold for 5 seconds Repeat ___10_ times per set. Do _3___ sets per session. Do _1__ sessions per day.  http://orth.exer.us/832   Copyright  VHI. All rights reserved.

## 2016-06-30 NOTE — Therapy (Signed)
[facility] [address] [place], [address] Phone: [phone]   Fax:  [phone]  Physical Therapy Treatment  Patient Details  Name: Francisco [name] MRN: [id number] Date of Birth: 1946-02-15 Referring Provider: Mallie Mussel A. Pool, MD  Encounter Date: 06/30/2016      PT End of Session - 06/30/16 0934    Visit Number 5   Number of Visits 21   Date for PT Re-Evaluation 07/21/16   Authorization Type 5   Authorization Time Period of 10   PT Start Time 0934   PT Stop Time 1024   PT Time Calculation (min) 50 min   Equipment Utilized During Treatment Gait belt   Activity Tolerance Patient tolerated treatment well   Behavior During Therapy WFL for tasks assessed/performed      Past Medical History:  Diagnosis Date  . Bronchitis, chronic ([place])   . 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 COPD (chronic obstructive pulmonary disease) ([place])   . Hypertension    doesn't have a cardiologist;pt is maintained by medical md for htn;requested ekg[facility]r from [facility]  . Hypothyroidism    pt takes Levothyroxine daily  . Myasthenia gravis, adult form ([place])   . Shortness of breath    Lung MD- Dr Darlin Coco  . Sleep apnea    do not use CPAP every night    Past Surgical History:  Procedure Laterality Date  . ANTERIOR CERVICAL DECOMP/DISCECTOMY FUSION  07/18/2011   Procedure: ANTERIOR CERVICAL DECOMPRESSION/DISCECTOMY FUSION 2 LEVELS;  Surgeon: Cooper Render Pool;  Location: [facility];  Service: Neurosurgery;  Laterality: N/A;  cervical five-six, cervical six-seven anterior cervical discectomy and fusion  . BACK SURGERY     in [place]     2005 at [facility]  . HERNIA REPAIR Left    inguinal hernia repair in 1997  . LUMBAR LAMINECTOMY/DECOMPRESSION MICRODISCECTOMY Left 02/24/2014   Procedure: LUMBAR  LAMINECTOMY/DECOMPRESSION MICRODISCECTOMY LUMBAR THREE-FOUR, FOUR-FIVE, LEFT FIVE-SACRAL ONE ;  Surgeon: Charlie Pitter, MD;  Location: [facility];  Service: Neurosurgery;  Laterality: Left;  LUMBAR LAMINECTOMY/DECOMPRESSION MICRODISCECTOMY LUMBAR THREE-FOUR, FOUR-FIVE, LEFT FIVE-SACRAL ONE   . PROSTATECTOMY  8/14   [facility] Dr Mare Ferrari     There were no vitals filed for this visit.      Subjective Assessment - 06/30/16 0936    Subjective Pt states standing for about 5 hours on Monday which caused soreness bilateral thighs. Back is fine. Still has L lateral hip joint pain which is not constant. It comes and goes. No back or L hip pain currently.  3/10 Low back, and 5/10 L LE (lateral hip and thigh pain not going past knees; L5/S1 dermatome area) at most for the past week.  Pt also states falling onto his forearms about 10 months ago injuring his L shoulder. Still feels pain from it.    Pertinent History Low back and L LE pain. Pt states that he recently had back surgery (about 3 months ago, lower lumbar) secondary to back pain and back arthritis which decreased his back pain. Currently has hip and leg pain (no hip and leg pain prior to his surgery) which appeared 4-5 weeks following his procedure. Pt also states using his SPC on his R side which helps. No falls for the past 6 months. Still currently works in [employer] which involves a lot  of standing and walking.  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    Patient Stated Goals "I would love to be able to decrease my pain level and walk for longer distances or stand longer."   Currently in Pain? No/denies                                 PT Education - 06/30/16 0947    Education provided Yes   Education Details ther-ex, HEP   Person(s) Educated Patient   Methods Explanation;Demonstration;Tactile cues;Verbal cues;Handout    Comprehension Verbalized understanding;Returned demonstration        Objectives   There-ex  Directed patient with seated thoracic extension over chair 10x2 with 5 second holds to promote thoracic mobility and decrease low back pressure  SLS on L LE with contralateral LE on 1st regular step 10x10 seconds with bilateral UE assist.   Shoulder ER with scapular retraction (to promote thoracic extension) resisting red band 10x3    Standing bilateral shoulder extension to neutral with scapular retraction resisting red band 10x5 seconds for 2 sets to promote thoracic extension.  Sit <>stand with emphasis on L LE use to promote L LE strength 10x2 from slightly elevated mat table.   Side stepping 5 ft to the R, and 5 ft to the L 10x each way with bilateral UE assist from treadmill bars.  Slight L posterior lateral thigh discomfort which decreases with sitting rest break.   Standing bilateral ankle DF/PF on rockerboard 2 min to promote LE flexibility.   Reviewed HEP. Please see patient instructions. Pt demonstrated and verbalized understanding.    Nustep seat 8, no arms, level 1 x 5 min. Unbilled.      Improved exercise technique, movement at target joints, use of target muscles after min to mod verbal, visual, tactile cues.      Pt making progress with L LE symptoms with pain not going past L knee overall at this point in time as well as overall decrease in pain level. Pt also demonstrates (+) [employer], yocum, and empty can tests with reproduction of L shoulder symptoms. L shoulder ER muscle strength 4/5. L shoulder symptoms did not improve with shoulder ER with scapular retraction (to promote thoracic extension as well) and shoulder extension to neutral with scapular retraction (to promote thoracic extension as well) to activate infraspinatus, teres minor, and scapular muscles.               PT Long Term Goals - 06/21/16 1229      PT LONG TERM GOAL #1   Title  Patient will have a decrease in L LE pain to 2/10 or less at worst to promote ability to stand, and walk for a longer duration as well as improve ability to perform garden work.    Baseline 6/10 L LE pain at most for the past week (06/21/2016)   Time 4   Period Weeks   Status On-going     PT LONG TERM GOAL #2   Title Patient will improve bilateral hip extension PROM by at least 5 degrees to promote ability to perform standing tasks, ambulate longer distances with less L LE symptoms.    Baseline PROM: -5 degrees R hip extension, -10 degrees L hip extension; PROM: -15 degrees R hip extension, -10 degrees L hip  extension (06/02/2016); L hip extension - 2 degrees, R hip extension -13 degrees (06/21/2016)    Time 4   Period Weeks   Status On-going     PT LONG TERM GOAL #3   Title Patient will improve bilateral glute med and max strength by at least 1/2 MMT grade to promote ability to ambulate longer distances and stand for a longer duration with less L LE symptoms.    Time 4   Period Weeks   Status Partially Met     PT LONG TERM GOAL #4   Title Patient will improve his Modified Oswestry Low Back Pain Disability Questionnaire by at least 10% as a demonstration of improved function.    Baseline 46%; 40% (06/02/2016); current score 36% (06/21/2016)   Time 4   Period Weeks   Status Achieved               Plan - 06/30/16 0959    Clinical Impression Statement Pt making progress with L LE symptoms with pain not going past L knee overall at this point in time as well as overall decrease in pain level. Pt also demonstrates (+) [employer], yocum, and empty can tests with reproduction of L shoulder symptoms. L shoulder ER muscle strength 4/5. L shoulder symptoms did not improve with shoulder ER with scapular retraction (to promote thoracic extension as well) and shoulder extension to neutral with scapular retraction (to promote thoracic extension as well) to activate infraspinatus, teres  minor, and scapular muscles.    Rehab Potential Good   Clinical Impairments Affecting Rehab Potential age, pain, weakness   PT Frequency 2x / week   PT Duration 4 weeks   PT Treatment/Interventions Therapeutic exercise;Therapeutic activities;Manual techniques;Patient/family education;Neuromuscular re-education;Balance training;Gait training;Aquatic Therapy   PT Next Visit Plan Hip strengthening, lumbopelvic stabilty, gait training   Consulted and Agree with Plan of Care Patient      Patient will benefit from skilled therapeutic intervention in order to improve the following deficits and impairments:  Pain, Improper body mechanics, Postural dysfunction, Decreased strength, Difficulty walking  Visit Diagnosis: Muscle weakness (generalized)  Difficulty in walking, not elsewhere classified  Radiculopathy, lumbar region     Problem List Patient Active Problem List   Diagnosis Date Noted  . COPD (chronic obstructive pulmonary disease) ([place]) 05/26/2015  . Myasthenia gravis ([place]) 05/26/2015  . Spinal stenosis, lumbar region, with neurogenic claudication 02/24/2014  . Lumbosacral stenosis with neurogenic claudication ([place]) 02/24/2014    Joneen Boers PT, DPT   06/30/2016, 11:45 AM  [facility] [address], [place], [address] Phone: [phone]   Fax:  [phone]  Name: TIQUAN BOUCH MRN: [id number] Date of Birth: 01-21-1946

## 2016-07-04 ENCOUNTER — Ambulatory Visit: Payer: Managed Care, Other (non HMO)

## 2016-07-04 ENCOUNTER — Other Ambulatory Visit: Payer: Self-pay | Admitting: Internal Medicine

## 2016-07-04 DIAGNOSIS — M6281 Muscle weakness (generalized): Secondary | ICD-10-CM

## 2016-07-04 DIAGNOSIS — M5416 Radiculopathy, lumbar region: Secondary | ICD-10-CM | POA: Diagnosis not present

## 2016-07-04 DIAGNOSIS — R262 Difficulty in walking, not elsewhere classified: Secondary | ICD-10-CM

## 2016-07-04 NOTE — Therapy (Signed)
[place] [facility] [address], [place], [address] Phone: [phone]   Fax:  [phone]  Physical Therapy Treatment  Patient Details  Name: Francisco [name] MRN: [id number] Date of Birth: 06/07/1946 Referring Provider: Mallie Mussel A. Pool, MD  Encounter Date: 07/04/2016      PT End of Session - 07/04/16 0945    Visit Number 6   Number of Visits 21   Date for PT Re-Evaluation 07/21/16   Authorization Type 6   Authorization Time Period of 10   PT Start Time 0945   PT Stop Time 1034   PT Time Calculation (min) 49 min   Equipment Utilized During Treatment Gait belt   Activity Tolerance Patient tolerated treatment well   Behavior During Therapy WFL for tasks assessed/performed      Past Medical History:  Diagnosis Date  . Bronchitis, chronic ([place])   . 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 COPD (chronic obstructive pulmonary disease) ([place])   . Hypertension    doesn't have a cardiologist;pt is maintained by medical md for htn;requ[facility] e[facility]cx[facility] from [facility]  . Hypothyroidism    pt takes Levothyroxine daily  . Myasthenia gravis, adult form ([place])   . Shortness of breath    Lung MD- Dr Darlin Coco  . Sleep apnea    do not use CPAP every night    Past Surgical History:  Procedure Laterality Date  . ANTERIOR CERVICAL DECOMP/DISCECTOMY FUSION  07/18/2011   Procedure: ANTERIOR CERVICAL DECOMPRESSION/DISCECTOMY FUSION 2 LEVELS;  Surgeon: Cooper Render Pool;  Location: [place] [facility];  Service: Neurosurgery;  Laterality: N/A;  cervical five-six, cervical six-seven anterior cervical discectomy and fusion  . BACK SURGERY     in [place]     2005 at [facility]  . HERNIA REPAIR Left    inguinal hernia repair in 1997  . LUMBAR LAMINECTOMY/DECOMPRESSION MICRODISCECTOMY Left 02/24/2014   Procedure: LUMBAR  LAMINECTOMY/DECOMPRESSION MICRODISCECTOMY LUMBAR THREE-FOUR, FOUR-FIVE, LEFT FIVE-SACRAL ONE ;  Surgeon: Charlie Pitter, MD;  Location: [place] [facility];  Service: Neurosurgery;  Laterality: Left;  LUMBAR LAMINECTOMY/DECOMPRESSION MICRODISCECTOMY LUMBAR THREE-FOUR, FOUR-FIVE, LEFT FIVE-SACRAL ONE   . PROSTATECTOMY  8/14   [facility] Dr Mare Ferrari     There were no vitals filed for this visit.      Subjective Assessment - 07/04/16 0947    Subjective Have not taken his medications yet. Back and L LE are feeling pretty good today. No pain currently. Back and L LE pain was off and on this weekend.    Pertinent History Low back and L LE pain. Pt states that he recently had back surgery (about 3 months ago, lower lumbar) secondary to back pain and back arthritis which decreased his back pain. Currently has hip and leg pain (no hip and leg pain prior to his surgery) which appeared 4-5 weeks following his procedure. Pt also states using his SPC on his R side which helps. No falls for the past 6 months. 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    Patient Stated Goals "I would love to be able to decrease my pain level and walk for longer distances or stand longer."  Currently in Pain? No/denies   Pain Score 0-No pain                                 PT Education - 07/04/16 0949    Education provided Yes   Education Details ther-ex   Person(s) Educated Patient   Methods Explanation;Demonstration;Tactile cues;Verbal cues   Comprehension Verbalized understanding;Returned demonstration        Objectives   There-ex  Directed patient with seated thoracic extension over chair 10x with 5 second holds to promote thoracic mobility and decrease low back pressure  Side stepping 5 ft to the R, and 5 ft to the L 10x  each way with bilateral UE assist from treadmill bars with slight posterior pelvic tilt  Standing bilateral shoulder extension to neutral with scapular retraction resisting red band 10x5 seconds for 2 setsto promote thoracic extension.  Forward step up onto regular step with L LE with bilateral UE assist 10x2  Shoulder ER with scapular retraction (to promote thoracic extension) resisting red band 10x3   Seated posterior pelvic tilts 5x5 seconds for 3 sets to decrease low back pressure  Forward wedding march 32 ft 4x  SLS with opposite tip toe assist and bilateral UE assist 7x 5 seconds each LE, emphasis on pelvic control   Slight L low back pulling which eases with rest  Standing bilateral ankle DF/PF on rockerboard 2 min to promote LE flexibility.   Nustep seat 8, no arms, level 1 x 5 min. Unbilled.      Improved exercise technique, movement at target joints, use of target muscles after min to mod verbal, visual, tactile cues.     L lateral thigh symptoms with standing side stepping exercise which decreased with posterior pelvic tilting. Worked on thoracic extension, L glute strengthening, and posterior pelvic tilting (to improve abdominal strength and use ) to decrease low back pressure.              PT Long Term Goals - 06/21/16 1229      PT LONG TERM GOAL #1   Title Patient will have a decrease in L LE pain to 2/10 or less at worst to promote ability to stand, and walk for a longer duration as well as improve ability to perform garden work.    Baseline 6/10 L LE pain at most for the past week (06/21/2016)   Time 4   Period Weeks   Status On-going     PT LONG TERM GOAL #2   Title Patient will improve bilateral hip extension PROM by at least 5 degrees to promote ability to perform standing tasks, ambulate longer distances with less L LE symptoms.    Baseline PROM: -5 degrees R hip extension, -10 degrees L hip extension; PROM: -15 degrees R hip extension, -10  degrees L hip extension (06/02/2016); L hip extension - 2 degrees, R hip extension -13 degrees (06/21/2016)    Time 4   Period Weeks   Status On-going     PT LONG TERM GOAL #3   Title Patient will improve bilateral glute med and max strength by at least 1/2 MMT grade to promote ability to ambulate longer distances and stand for a longer duration with less L LE symptoms.    Time 4   Period Weeks   Status Partially Met     PT LONG TERM GOAL #4   Title Patient will improve his Modified Oswestry Low  Back Pain Disability Questionnaire by at least 10% as a demonstration of improved function.    Baseline 46%; 40% (06/02/2016); current score 36% (06/21/2016)   Time 4   Period Weeks   Status Achieved               Plan - 07/04/16 0949    Clinical Impression Statement L lateral thigh symptoms with standing side stepping exercise which decreased with posterior pelvic tilting. Worked on thoracic extension, L glute strengthening, and posterior pelvic tilting (to improve abdominal strength and use ) to decrease low back pressure.     Rehab Potential Good   Clinical Impairments Affecting Rehab Potential age, pain, weakness   PT Frequency 2x / week   PT Duration 4 weeks   PT Treatment/Interventions Therapeutic exercise;Therapeutic activities;Manual techniques;Patient/family education;Neuromuscular re-education;Balance training;Gait training;Aquatic Therapy   PT Next Visit Plan Hip strengthening, lumbopelvic stabilty, gait training   Consulted and Agree with Plan of Care Patient      Patient will benefit from skilled therapeutic intervention in order to improve the following deficits and impairments:  Pain, Improper body mechanics, Postural dysfunction, Decreased strength, Difficulty walking  Visit Diagnosis: Muscle weakness (generalized)  Difficulty in walking, not elsewhere classified  Radiculopathy, lumbar region     Problem List Patient Active Problem List   Diagnosis Date Noted   . COPD (chronic obstructive pulmonary disease) ([place]) 05/26/2015  . Myasthenia gravis ([place]) 05/26/2015  . Spinal stenosis, lumbar region, with neurogenic claudication 02/24/2014  . Lumbosacral stenosis with neurogenic claudication ([place]) 02/24/2014    Joneen Boers PT, DPT   07/04/2016, 12:59 PM  [place] [facility] [address], [place], [address] Phone: [phone]   Fax:  [phone]  Name: IKTAN AIKMAN MRN: [id number] Date of Birth: 1945/09/13

## 2016-07-11 ENCOUNTER — Ambulatory Visit: Payer: Managed Care, Other (non HMO)

## 2016-07-11 DIAGNOSIS — M5416 Radiculopathy, lumbar region: Secondary | ICD-10-CM

## 2016-07-11 DIAGNOSIS — R262 Difficulty in walking, not elsewhere classified: Secondary | ICD-10-CM

## 2016-07-11 DIAGNOSIS — M6281 Muscle weakness (generalized): Secondary | ICD-10-CM

## 2016-07-11 NOTE — Therapy (Signed)
[place] [facility] [address] [place], [address] Phone: [phone]   Fax:  [phone]  Physical Therapy Treatment  Patient Details  Name: Francisco [name] MRN: [id number] Date of Birth: 03-16-1946 Referring Provider: Mallie Mussel A. Pool, MD  Encounter Date: 07/11/2016      PT End of Session - 07/11/16 1434    Visit Number 7   Number of Visits 21   Date for PT Re-Evaluation 07/21/16   Authorization Type 7   Authorization Time Period of 10   PT Start Time 1434   PT Stop Time 1518   PT Time Calculation (min) 44 min   Equipment Utilized During Treatment Gait belt   Activity Tolerance Patient tolerated treatment well   Behavior During Therapy WFL for tasks assessed/performed      Past Medical History:  Diagnosis Date  . Bronchitis, chronic ([place])   . 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 COPD (chronic obstructive pulmonary disease) ([place])   . Hypertension    doesn't have a cardiologist;pt is maintained by medical md for htn;requested ekg[facility]cxr from [facility]  . Hypothyroidism    pt takes Levothyroxine daily  . Myasthenia gravis, adult form ([place])   . Shortness of breath    Lung MD- Dr Darlin Coco  . Sleep apnea    do not use CPAP every night    Past Surgical History:  Procedure Laterality Date  . ANTERIOR CERVICAL DECOMP/DISCECTOMY FUSION  07/18/2011   Procedure: ANTERIOR CERVICAL DECOMPRESSION/DISCECTOMY FUSION 2 LEVELS;  Surgeon: Cooper Render Pool;  Location: [place] [facility];  Service: Neurosurgery;  Laterality: N/A;  cervical five-six, cervical six-seven anterior cervical discectomy and fusion  . BACK SURGERY     in [place]     2005 at [facility]  . HERNIA REPAIR Left    inguinal hernia repair in 1997  . LUMBAR LAMINECTOMY/DECOMPRESSION MICRODISCECTOMY Left 02/24/2014   Procedure: LUMBAR  LAMINECTOMY/DECOMPRESSION MICRODISCECTOMY LUMBAR THREE-FOUR, FOUR-FIVE, LEFT FIVE-SACRAL ONE ;  Surgeon: Charlie Pitter, MD;  Location: [place] [facility];  Service: Neurosurgery;  Laterality: Left;  LUMBAR LAMINECTOMY/DECOMPRESSION MICRODISCECTOMY LUMBAR THREE-FOUR, FOUR-FIVE, LEFT FIVE-SACRAL ONE   . PROSTATECTOMY  8/14   [facility] Dr Mare Ferrari     There were no vitals filed for this visit.      Subjective Assessment - 07/11/16 1436    Subjective Back and L LE is feeling pretty good. Feeling very little discomfort. Walking a longer distance (5-6 minutes) feels an ache, has to rest it then does some more.    Pertinent History Low back and L LE pain. Pt states that he recently had back surgery (about 3 months ago, lower lumbar) secondary to back pain and back arthritis which decreased his back pain. Currently has hip and leg pain (no hip and leg pain prior to his surgery) which appeared 4-5 weeks following his procedure. Pt also states using his SPC on his R side which helps. No falls for the past 6 months. 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    Patient Stated Goals "I would love to be able to decrease my pain level and walk for longer distances or stand longer."  Currently in Pain? Yes   Pain Score --  No pain level provided            [facility] PT Assessment - 07/11/16 0001      Observation/Other Assessments   Observations 6 minute walk: 1172 ft. L posterior glute discomfort after about 3 min. Pt demonstrates decreased bilateral hip extension L > R, L lateral lean during L LE stance phase                             PT Education - 07/11/16 1439    Education provided Yes   Education Details ther-ex, HEP   Person(s) Educated Patient   Methods Explanation;Demonstration;Tactile cues;Verbal cues;Handout    Comprehension Returned demonstration;Verbalized understanding        Objectives   There-ex  Directed patient with gait 6 min  Pt demonstrates decreased bilateral hip extension L > R, L lateral lean during L LE stance phase. L posterior glute discomfort after 3 min 1172 ft    Seated posterior pelvic tilts 10x5 seconds  Decreased L posterior hip pain   supine L hip extension isometrics with heating pad at L hip flexor muscles. R knee in hooklying position.  10x5 seconds for 3 sets to promote L hip flexor mobility and glute max use  Standing backwards walk 5 ft x 6 with one UE assist from the treadmill bars to promote glute use and hip flexor mobility.   Single leg stance on L LE with R LE toe taps onto treadmill platform, with bilateral UE assist 10x3  Forward step up onto regular step with L LE with bilateral UE assist 10x2  Demonstrates R lateral shift while stepping up and stepping down.   Improved exercise technique, movement at target joints, use of target muscles after mod verbal, visual, tactile cues.     L posterior hip discomfort after about 3 min of walking. Pt demonstrates decreased hip extension L > R, decreased stride length with L lateral lean during L LE stance phase. Decreased symptoms with sitting as well as with seated posterior pelvic tilting exercise. Also worked on L hip flexor muscle flexibility with use of L glute max muscle.          PT Long Term Goals - 06/21/16 1229      PT LONG TERM GOAL #1   Title Patient will have a decrease in L LE pain to 2/10 or less at worst to promote ability to stand, and walk for a longer duration as well as improve ability to perform garden work.    Baseline 6/10 L LE pain at most for the past week (06/21/2016)   Time 4   Period Weeks   Status On-going     PT LONG TERM GOAL #2   Title Patient will improve bilateral hip extension PROM by at least 5 degrees to promote ability to perform standing tasks, ambulate  longer distances with less L LE symptoms.    Baseline PROM: -5 degrees R hip extension, -10 degrees L hip extension; PROM: -15 degrees R hip extension, -10 degrees L hip extension (06/02/2016); L hip extension - 2 degrees, R hip extension -13 degrees (06/21/2016)    Time 4   Period Weeks   Status On-going     PT LONG TERM GOAL #3   Title Patient will improve bilateral glute med and max strength by at least 1/2 MMT grade to promote ability to ambulate longer distances and  stand for a longer duration with less L LE symptoms.    Time 4   Period Weeks   Status Partially Met     PT LONG TERM GOAL #4   Title Patient will improve his Modified Oswestry Low Back Pain Disability Questionnaire by at least 10% as a demonstration of improved function.    Baseline 46%; 40% (06/02/2016); current score 36% (06/21/2016)   Time 4   Period Weeks   Status Achieved               Plan - 07/11/16 1439    Clinical Impression Statement L posterior hip discomfort after about 3 min of walking. Pt demonstrates decreased hip extension L > R, decreased stride length with L lateral lean during L LE stance phase. Decreased symptoms with sitting as well as with seated posterior pelvic tilting exercise. Also worked on L hip flexor muscle flexibility with use of L glute max muscle.    Rehab Potential Good   Clinical Impairments Affecting Rehab Potential age, pain, weakness   PT Frequency 2x / week   PT Duration 4 weeks   PT Treatment/Interventions Therapeutic exercise;Therapeutic activities;Manual techniques;Patient/family education;Neuromuscular re-education;Balance training;Gait training;Aquatic Therapy   PT Next Visit Plan Hip strengthening, lumbopelvic stabilty, gait training   Consulted and Agree with Plan of Care Patient      Patient will benefit from skilled therapeutic intervention in order to improve the following deficits and impairments:  Pain, Improper body mechanics, Postural dysfunction, Decreased  strength, Difficulty walking  Visit Diagnosis: Muscle weakness (generalized)  Difficulty in walking, not elsewhere classified  Radiculopathy, lumbar region     Problem List Patient Active Problem List   Diagnosis Date Noted  . COPD (chronic obstructive pulmonary disease) ([place]) 05/26/2015  . Myasthenia gravis ([place]) 05/26/2015  . Spinal stenosis, lumbar region, with neurogenic claudication 02/24/2014  . Lumbosacral stenosis with neurogenic claudication ([place]) 02/24/2014    Joneen Boers PT, DPT   07/11/2016, 8:11 PM  [place] [facility] [address], [place], [address] Phone: [phone]   Fax:  [phone]  Name: REYMUNDO WINSHIP MRN: [id number] Date of Birth: [birth year]

## 2016-07-11 NOTE — Patient Instructions (Signed)
   Supine L hip flexor muscle stretch   You can place a heating pad in front of your left hip while lying on your back.    Keep your right knee bent, and your left knee straight.    Press your entire left leg against the bed to feel a stretch in front of your left hip, and muscle work at your left rear end muscle    Hold for 5 seconds.    Repeat 10 times.    Perform 3 sets daily.     Your can repeat for your right side.

## 2016-07-13 ENCOUNTER — Ambulatory Visit: Payer: Managed Care, Other (non HMO)

## 2016-08-02 ENCOUNTER — Other Ambulatory Visit (INDEPENDENT_AMBULATORY_CARE_PROVIDER_SITE_OTHER): Payer: Self-pay | Admitting: Vascular Surgery

## 2016-08-02 DIAGNOSIS — I6523 Occlusion and stenosis of bilateral carotid arteries: Secondary | ICD-10-CM

## 2016-08-08 ENCOUNTER — Other Ambulatory Visit (INDEPENDENT_AMBULATORY_CARE_PROVIDER_SITE_OTHER): Payer: Self-pay | Admitting: Vascular Surgery

## 2016-08-08 DIAGNOSIS — I6523 Occlusion and stenosis of bilateral carotid arteries: Secondary | ICD-10-CM

## 2016-08-12 ENCOUNTER — Ambulatory Visit (INDEPENDENT_AMBULATORY_CARE_PROVIDER_SITE_OTHER): Payer: Self-pay | Admitting: Vascular Surgery

## 2016-08-12 ENCOUNTER — Encounter (INDEPENDENT_AMBULATORY_CARE_PROVIDER_SITE_OTHER): Payer: Self-pay

## 2016-08-18 ENCOUNTER — Encounter: Payer: Self-pay | Admitting: Internal Medicine

## 2016-08-18 ENCOUNTER — Telehealth: Payer: Self-pay | Admitting: Internal Medicine

## 2016-08-18 ENCOUNTER — Ambulatory Visit (INDEPENDENT_AMBULATORY_CARE_PROVIDER_SITE_OTHER): Payer: Managed Care, Other (non HMO) | Admitting: Internal Medicine

## 2016-08-18 VITALS — BP 128/62 | HR 75 | Wt 250.0 lb

## 2016-08-18 DIAGNOSIS — J449 Chronic obstructive pulmonary disease, unspecified: Secondary | ICD-10-CM

## 2016-08-18 DIAGNOSIS — I6523 Occlusion and stenosis of bilateral carotid arteries: Secondary | ICD-10-CM

## 2016-08-18 NOTE — Telephone Encounter (Signed)
Form received and placed in DK folder to be signed and faxed back. Nothing further needed.

## 2016-08-18 NOTE — Telephone Encounter (Signed)
Spoke with Francisco [name] with [employer] who states pt will need a new ONO per insurance. Francisco [name] states he will fax over a order form, once we have received that form I will place it in DK folder to be signed. Nothing further needed at this time.  Will route to DK for a fyi

## 2016-08-18 NOTE — Telephone Encounter (Signed)
ok 

## 2016-08-18 NOTE — Telephone Encounter (Signed)
[employer] calling to see if an overnight oxymetry order was received. Please call.

## 2016-08-18 NOTE — Patient Instructions (Signed)
Continue Oxygen at night Continue Spiriva

## 2016-08-18 NOTE — Progress Notes (Signed)
[place] [facility] Consultation      Date: 08/18/2016,   MRN# [id number] Francisco [name] 16-Aug-1946 Code Status:  Hosp day:@LENGTHOFSTAYDAYS @ Referring MD: @ATDPROV @     PCP:      Admission                  Current  Francisco [name] is a 70 y.o. 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  aspirin 81 MG tablet, Take 81 mg by mouth every other day. , Disp: , Rfl:  .  azaTHIOprine (IMURAN) 50 MG tablet, Take 50 mg by mouth daily., Disp: , Rfl:  .  CHLOROTHIAZIDE PO, Take by mouth., Disp: , Rfl:  .  gabapentin (NEURONTIN) 300 MG capsule, Take 300 mg by mouth 2 (two) times daily., Disp: , Rfl:  .  levothyroxine (SYNTHROID, LEVOTHROID) 88 MCG tablet, Take 88 mcg by mouth daily.  , Disp: , Rfl:  .  losartan (COZAAR) 50 MG tablet, Take 50 mg by mouth 2 (two) times daily. , Disp: , Rfl:  .  MELOXICAM PO, Take by mouth., Disp: , Rfl:  .  mirabegron ER (MYRBETRIQ) 25 MG TB24 tablet, Take 50 mg by mouth daily., Disp: , Rfl:  .  montelukast (SINGULAIR) 10 MG tablet, Take 10 mg by mouth at bedtime., Disp: , Rfl:  .  OXYCODONE ER PO, Take 10-325 mg by mouth as needed. , Disp: , Rfl:  .  pantoprazole (PROTONIX) 40 MG tablet, Take 40 mg by mouth daily., Disp: , Rfl:  .  Polyethylene Glycol 3350 (MIRALAX PO), Take by mouth., Disp: , Rfl:  .  predniSONE (DELTASONE) 20 MG tablet, Take 40  mg by mouth every other day., Disp: , Rfl:  .  pyridostigmine (MESTINON) 60 MG tablet, Take 60 mg by mouth daily. , Disp: , Rfl:  .  SPIRIVA HANDIHALER 18 MCG inhalation capsule, PLACE 1 CAPSULE (18 MCG TOTAL) INTO INHALER AND INHALE DAILY., Disp: 30 capsule, Rfl: 1 .  SPIRIVA HANDIHALER 18 MCG inhalation capsule, PLACE 1 CAPSULE (18 MCG TOTAL) INTO INHALER AND INHALE DAILY., Disp: 30 capsule, Rfl: 2 .  SPIRIVA HANDIHALER 18 MCG inhalation capsule, PLACE 1 CAPSULE (18 MCG TOTAL) INTO INHALER AND INHALE DAILY., Disp: 30 capsule, Rfl: 1 .  TOLTERODINE TARTRATE PO, Take by mouth., Disp: , Rfl:     ALLERGIES   Codeine     REVIEW OF SYSTEMS   Review of Systems  Constitutional: Negative for chills, diaphoresis, fever, malaise/fatigue and weight loss.  HENT: Negative for congestion.   Respiratory: Negative for cough, hemoptysis, sputum production, shortness of breath and wheezing.   Cardiovascular: Negative for chest pain.  Gastrointestinal: Negative for abdominal pain, heartburn, nausea and vomiting.  Skin: Negative for rash.  Neurological: Negative for weakness.  All other systems reviewed and are negative.  PHYSICAL EXAM  Physical Exam  Constitutional: He is oriented to person, place, and time. He appears well-developed and well-nourished. No distress.  HENT:  Mouth/Throat: No oropharyngeal exudate.  Cardiovascular: Normal rate, regular rhythm and normal heart sounds.   No murmur heard. Pulmonary/Chest: No respiratory distress. He has no wheezes.  Neurological: He is alert and oriented to person, place, and time.  Skin: He is not diaphoretic.  Psychiatric: He has a normal mood and affect.         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  Patient is satisfied with Plan of action and management.   Corrin Parker, M.D.  [facility]  Medical Director [place] Director [facility] Cardio-Pulmonary Department

## 2016-08-23 ENCOUNTER — Telehealth: Payer: Self-pay | Admitting: Internal Medicine

## 2016-08-23 NOTE — Telephone Encounter (Signed)
Per Leafy Ro at [place], [employer] is requesting a new ONO on RA. Form faxed from [facility] requesting this. ONO order form completed, signed and faxed back to [place] to [phone]. Nothing else needed at this time. Form sent to be scanned into Epic. Rhonda J Cobb

## 2016-08-30 ENCOUNTER — Encounter: Payer: Self-pay | Admitting: Internal Medicine

## 2016-09-02 ENCOUNTER — Telehealth: Payer: Self-pay

## 2016-09-02 DIAGNOSIS — J439 Emphysema, unspecified: Secondary | ICD-10-CM

## 2016-09-02 NOTE — Telephone Encounter (Signed)
Pt aware to cont 2L qhs. Pt voiced understanding and had no further questions. Order placed to [employer] to cont. O2 Nothing further needed.

## 2016-09-16 ENCOUNTER — Ambulatory Visit (INDEPENDENT_AMBULATORY_CARE_PROVIDER_SITE_OTHER): Payer: Managed Care, Other (non HMO) | Admitting: Vascular Surgery

## 2016-09-16 ENCOUNTER — Ambulatory Visit (INDEPENDENT_AMBULATORY_CARE_PROVIDER_SITE_OTHER): Payer: Managed Care, Other (non HMO)

## 2016-09-16 ENCOUNTER — Encounter (INDEPENDENT_AMBULATORY_CARE_PROVIDER_SITE_OTHER): Payer: Self-pay | Admitting: Vascular Surgery

## 2016-09-16 VITALS — BP 168/90 | HR 70 | Resp 17 | Wt 246.0 lb

## 2016-09-16 DIAGNOSIS — I6523 Occlusion and stenosis of bilateral carotid arteries: Secondary | ICD-10-CM

## 2016-09-16 DIAGNOSIS — I10 Essential (primary) hypertension: Secondary | ICD-10-CM | POA: Diagnosis not present

## 2016-09-16 DIAGNOSIS — J439 Emphysema, unspecified: Secondary | ICD-10-CM | POA: Diagnosis not present

## 2016-09-16 DIAGNOSIS — G9519 Other vascular myelopathies: Secondary | ICD-10-CM | POA: Diagnosis not present

## 2016-09-16 DIAGNOSIS — I779 Disorder of arteries and arterioles, unspecified: Secondary | ICD-10-CM | POA: Insufficient documentation

## 2016-09-16 DIAGNOSIS — M4807 Spinal stenosis, lumbosacral region: Secondary | ICD-10-CM | POA: Diagnosis not present

## 2016-09-16 DIAGNOSIS — I739 Peripheral vascular disease, unspecified: Secondary | ICD-10-CM

## 2016-09-16 LAB — US CAROTID BILATERAL
LCCADDIAS: -21 cm/s
LCCADSYS: -96 cm/s
LEFT ECA DIAS: 22 cm/s
Left CCA prox dias: 23 cm/s
Left CCA prox sys: 120 cm/s
Left ICA dist dias: -26 cm/s
Left ICA dist sys: -79 cm/s
Left ICA prox dias: -22 cm/s
Left ICA prox sys: -177 cm/s
RCCAPDIAS: 23 cm/s
RCCAPSYS: 125 cm/s
RIGHT CCA MID DIAS: 18 cm/s
RIGHT ECA DIAS: -27 cm/s
Right cca dist sys: -63 cm/s

## 2016-09-16 NOTE — Progress Notes (Signed)
MRN : [id number]  Francisco [name] is a 71 y.o. ([birth year]) male who presents with chief complaint of  Chief Complaint  Patient presents with  . Follow-up  .  History of Present Illness: Patient returns in follow-up of his carotid disease. He is doing well without specific complaints today. He denies focal neurologic symptoms. Specifically, the patient denies amaurosis fugax, speech or swallowing difficulties, or arm or leg weakness or numbness Duplex ultrasound shows upper end of 40-59% carotid artery stenosis bilaterally.  Current Outpatient Prescriptions  Medication Sig Dispense Refill  . aspirin 81 MG tablet Take 81 mg by mouth every other day.     . azaTHIOprine (IMURAN) 50 MG tablet Take 50 mg by mouth daily.    . CHLOROTHIAZIDE PO Take by mouth.    . gabapentin (NEURONTIN) 300 MG capsule Take 300 mg by mouth 2 (two) times daily.    Marland Kitchen levothyroxine (SYNTHROID, LEVOTHROID) 88 MCG tablet Take 88 mcg by mouth daily.      Marland Kitchen losartan (COZAAR) 50 MG tablet Take 50 mg by mouth 2 (two) times daily.     . OXYCODONE ER PO Take 10-325 mg by mouth as needed.     . pantoprazole (PROTONIX) 40 MG tablet Take 40 mg by mouth daily.    . Polyethylene Glycol 3350 (MIRALAX PO) Take by mouth.    . predniSONE (DELTASONE) 20 MG tablet Take 40 mg by mouth every other day.    . pyridostigmine (MESTINON) 60 MG tablet Take 60 mg by mouth daily.     Marland Kitchen SPIRIVA HANDIHALER 18 MCG inhalation capsule PLACE 1 CAPSULE (18 MCG TOTAL) INTO INHALER AND INHALE DAILY. 30 capsule 2  . TOLTERODINE TARTRATE PO Take by mouth.    . mirabegron ER (MYRBETRIQ) 25 MG TB24 tablet Take 50 mg by mouth daily.    . montelukast (SINGULAIR) 10 MG tablet Take 10 mg by mouth at bedtime.     No current facility-administered medications for this visit.     Past Medical History:  Diagnosis Date  . Bronchitis, chronic ([place])   . 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 COPD (chronic obstructive pulmonary disease) ([place])   . Hypertension    doesn't have a cardiologist;pt is maintained by medical md for htn;requested [facility] from [facility]  . Hypothyroidism    pt takes Levothyroxine daily  . Myasthenia gravis, adult form ([place])   . Shortness of breath    Lung MD- Dr Darlin Coco  . Sleep apnea    do not use CPAP every night    Past Surgical History:  Procedure Laterality Date  . ANTERIOR CERVICAL DECOMP/DISCECTOMY FUSION  07/18/2011   Procedure: ANTERIOR CERVICAL DECOMPRESSION/DISCECTOMY FUSION 2 LEVELS;  Surgeon: Cooper Render Pool;  Location: [place] [facility];  Service: Neurosurgery;  Laterality: N/A;  cervical five-six, cervical six-seven anterior cervical discectomy and fusion  . BACK SURGERY     in [place]     2005 at [facility]  . HERNIA REPAIR Left    inguinal hernia repair in 1997  . LUMBAR LAMINECTOMY/DECOMPRESSION MICRODISCECTOMY Left 02/24/2014   Procedure: LUMBAR LAMINECTOMY/DECOMPRESSION MICRODISCECTOMY LUMBAR THREE-FOUR, FOUR-FIVE, LEFT FIVE-SACRAL ONE ;  Surgeon: Charlie Pitter, MD;  Location: [place] [facility];  Service: Neurosurgery;  Laterality: Left;  LUMBAR LAMINECTOMY/DECOMPRESSION MICRODISCECTOMY LUMBAR THREE-FOUR, FOUR-FIVE, LEFT FIVE-SACRAL ONE   . PROSTATECTOMY  8/14   [facility] Dr Legrand Como  [place] [facility]     Social History Social History  Substance Use Topics  . Smoking status: Former Smoker    Packs/day: 1.00    Years: 20.00    Types: Cigarettes    Quit date: 09/12/2001  . Smokeless tobacco: Never Used  . Alcohol use Yes     Comment: 6 beverages per week     Family History Family History  Problem Relation Age of Onset  . Hypertension Mother   . Stroke Mother   . Stroke Father     Allergies  Allergen Reactions  . Codeine Nausea And Vomiting     REVIEW OF SYSTEMS (Negative unless checked)  Constitutional: [] Weight loss  [] Fever  [] Chills Cardiac: [] Chest pain   [] Chest pressure   [] Palpitations    [] Shortness of breath when laying flat   [] Shortness of breath at rest   [] Shortness of breath with exertion. Vascular:  [] Pain in legs with walking   [] Pain in legs at rest   [] Pain in legs when laying flat   [] Claudication   [] Pain in feet when walking  [] Pain in feet at rest  [] Pain in feet when laying flat   [] History of DVT   [] Phlebitis   [] Swelling in legs   [] Varicose veins   [] Non-healing ulcers Pulmonary:   [] Uses home oxygen   [] Productive cough   [] Hemoptysis   [] Wheeze  [x] COPD   [] Asthma Neurologic:  [] Dizziness  [] Blackouts   [] Seizures   [] History of stroke   [] History of TIA  [] Aphasia   [] Temporary blindness   [] Dysphagia   [] Weakness or numbness in arms   [] Weakness or numbness in legs Musculoskeletal:  [x] Arthritis   [] Joint swelling   [x] Joint pain   [x] Low back pain Hematologic:  [] Easy bruising  [] Easy bleeding   [] Hypercoagulable state   [] Anemic  [] Hepatitis Gastrointestinal:  [] Blood in stool   [] Vomiting blood  [] Gastroesophageal reflux/heartburn   [] Difficulty swallowing. Genitourinary:  [] Chronic kidney disease   [] Difficult urination  [] Frequent urination  [] Burning with urination   [] Blood in urine Skin:  [] Rashes   [] Ulcers   [] Wounds Psychological:  [] History of anxiety   []  History of major depression.  Physical Examination  Vitals:   09/16/16 1349 09/16/16 1350  BP: (!) 178/77 (!) 168/90  Pulse: 70   Resp: 17   Weight: 246 lb (111.6 kg)    Body mass index is 35.3 kg/m. Gen:  WD/WN, NAD Head: [place]/AT, No temporalis wasting. Ear/Nose/Throat: Hearing grossly intact, nares w/o erythema or drainage, trachea midline Eyes: Conjunctiva clear. Sclera non-icteric Neck: Supple.  No JVD. Left carotid bruit Pulmonary:  Good air movement, equal and clear to auscultation bilaterally.  Cardiac: RRR, normal S1, S2, no Murmurs, rubs or gallops. Vascular:  Vessel Right Left  Radial Palpable Palpable  Ulnar Palpable Palpable  Brachial Palpable Palpable  Carotid  Palpable, without bruit Palpable, with bruit                       Gastrointestinal: soft, non-tender/non-distended. No guarding/reflex.  Musculoskeletal: M/S 5/5 throughout.  No deformity or atrophy.  Neurologic: CN 2-12 intact. Sensation grossly intact in extremities.  Symmetrical.  Speech is fluent. Motor exam as listed above. Psychiatric: Judgment intact, Mood & affect appropriate for pt's clinical situation. Dermatologic: No rashes or ulcers noted.  No cellulitis or open wounds. Lymph : No Cervical, Axillary, or Inguinal lymphadenopathy.     CBC Lab Results  Component Value Date   WBC 9.3 03/03/2014   HGB  9.2 (L) 03/03/2014   HCT 26.5 (L) 03/04/2014   MCV 96 03/03/2014   PLT 360 03/03/2014    BMET    Component Value Date/Time   NA 141 03/04/2014 0450   K 3.9 03/04/2014 0450   CL 104 03/04/2014 0450   CO2 28 03/04/2014 0450   GLUCOSE 100 (H) 03/04/2014 0450   BUN 25 (H) 03/04/2014 0450   CREATININE 0.99 03/04/2014 0450   CALCIUM 9.0 03/04/2014 0450   GFRNONAA >60 03/04/2014 0450   GFRAA >60 03/04/2014 0450   CrCl cannot be calculated (Patient's most recent lab result is older than the maximum 21 days allowed.).  COAG No results found for: INR, PROTIME  Radiology No results found.    Assessment/Plan COPD (chronic obstructive pulmonary disease) Followed by Pulmonology  Essential hypertension, benign blood pressure control important in reducing the progression of atherosclerotic disease. On appropriate oral medications.   Carotid stenosis Recommend:  Given the patient's asymptomatic subcritical stenosis no further invasive testing or surgery at this time.  Duplex ultrasound shows upper end of 40-59% carotid artery stenosis bilaterally.  Continue antiplatelet therapy as prescribed Continue management of CAD, HTN and Hyperlipidemia Healthy heart diet,  encouraged exercise at least 4 times per week Follow up in 6 months with duplex ultrasound and  physical exam based on >50% stenosis of bilateral carotid artery        Leotis Pain, MD  09/16/2016 2:25 PM    This note was created with Dragon medical transcription system.  Any errors from dictation are purely unintentional

## 2016-09-16 NOTE — Patient Instructions (Signed)

## 2016-09-16 NOTE — Assessment & Plan Note (Signed)
blood pressure control important in reducing the progression of atherosclerotic disease. On appropriate oral medications.  

## 2016-09-16 NOTE — Assessment & Plan Note (Signed)
Recommend:  Given the patient's asymptomatic subcritical stenosis no further invasive testing or surgery at this time.  Duplex ultrasound shows upper end of 40-59% carotid artery stenosis bilaterally.  Continue antiplatelet therapy as prescribed Continue management of CAD, HTN and Hyperlipidemia Healthy heart diet,  encouraged exercise at least 4 times per week Follow up in 6 months with duplex ultrasound and physical exam based on >50% stenosis of bilateral carotid artery

## 2016-09-16 NOTE — Assessment & Plan Note (Signed)
-

## 2016-09-28 ENCOUNTER — Other Ambulatory Visit: Payer: Self-pay | Admitting: Internal Medicine

## 2016-10-03 DIAGNOSIS — D692 Other nonthrombocytopenic purpura: Secondary | ICD-10-CM | POA: Insufficient documentation

## 2017-01-27 ENCOUNTER — Other Ambulatory Visit: Payer: Self-pay | Admitting: Neurosurgery

## 2017-01-27 DIAGNOSIS — M48062 Spinal stenosis, lumbar region with neurogenic claudication: Secondary | ICD-10-CM

## 2017-02-02 ENCOUNTER — Other Ambulatory Visit: Payer: Self-pay | Admitting: Neurosurgery

## 2017-02-02 ENCOUNTER — Ambulatory Visit
Admission: RE | Admit: 2017-02-02 | Discharge: 2017-02-02 | Disposition: A | Discharge status: Home / Self Care | Payer: Managed Care, Other (non HMO) | Source: Ambulatory Visit | Attending: Neurosurgery | Admitting: Neurosurgery

## 2017-02-02 DIAGNOSIS — M48062 Spinal stenosis, lumbar region with neurogenic claudication: Secondary | ICD-10-CM

## 2017-02-02 DIAGNOSIS — E039 Hypothyroidism, unspecified: Secondary | ICD-10-CM | POA: Insufficient documentation

## 2017-02-02 DIAGNOSIS — G4734 Idiopathic sleep related nonobstructive alveolar hypoventilation: Secondary | ICD-10-CM | POA: Insufficient documentation

## 2017-02-02 DIAGNOSIS — Z22322 Carrier or suspected carrier of Methicillin resistant Staphylococcus aureus: Secondary | ICD-10-CM | POA: Insufficient documentation

## 2017-02-02 DIAGNOSIS — D539 Nutritional anemia, unspecified: Secondary | ICD-10-CM | POA: Insufficient documentation

## 2017-02-02 DIAGNOSIS — M5030 Other cervical disc degeneration, unspecified cervical region: Secondary | ICD-10-CM | POA: Insufficient documentation

## 2017-02-02 DIAGNOSIS — D649 Anemia, unspecified: Secondary | ICD-10-CM | POA: Insufficient documentation

## 2017-02-02 MED ORDER — METHYLPREDNISOLONE ACETATE 40 MG/ML INJ SUSP (RADIOLOG
120.0000 mg | Freq: Once | INTRAMUSCULAR | Status: AC
  Administered 2017-02-02: 120 mg via EPIDURAL

## 2017-02-02 MED ORDER — IOPAMIDOL (ISOVUE-M 200) INJECTION 41%
1.0000 mL | Freq: Once | INTRAMUSCULAR | Status: AC
  Administered 2017-02-02: 1 mL via EPIDURAL

## 2017-02-02 NOTE — Discharge Instructions (Signed)

## 2017-02-28 ENCOUNTER — Ambulatory Visit (INDEPENDENT_AMBULATORY_CARE_PROVIDER_SITE_OTHER): Payer: Managed Care, Other (non HMO) | Admitting: Internal Medicine

## 2017-02-28 ENCOUNTER — Encounter: Payer: Self-pay | Admitting: Internal Medicine

## 2017-02-28 VITALS — BP 132/80 | HR 78 | Ht 70.0 in | Wt 248.0 lb

## 2017-02-28 DIAGNOSIS — J449 Chronic obstructive pulmonary disease, unspecified: Secondary | ICD-10-CM

## 2017-02-28 DIAGNOSIS — I6523 Occlusion and stenosis of bilateral carotid arteries: Secondary | ICD-10-CM

## 2017-02-28 NOTE — Patient Instructions (Signed)
Continue inhalers as prescribed 

## 2017-02-28 NOTE — Progress Notes (Signed)
[facility] Consultation      Date: 02/28/2017,   MRN# [id number] Francisco [name] 1946/08/31 Code Status:  Hosp day:@LENGTHOFSTAYDAYS @ Referring MD: @ATDPROV @     PCP:      AdmissionWeight: 248 lb (112.5 kg)                 CurrentWeight: 248 lb (112.5 kg) Francisco [name] is a 71 y.o. old male seen in consultation for COPD     CHIEF COMPLAINT:   Follow up [place] at baseline Doing well on spiriva -no wheezing, no fevers, chills Patient has no major complaints works as Hotel manager, qiut tobacco abuse 15 years ago   Had back surgery, skin graft of RT pointer finger for cancer, basal skin cancers no other issues Patient exercises daily walks about 1-2 miles per day  Patient has been previously diagnosed with obstructive sleep apnea however refuses CPAP therapy at this time Patient weighs 248 pounds Patient uses oxygen at night 2 L nasal cannula  Last PFT 12/2013 Ratio 60% FEV1 54% DLCO 60% FEF25/75 17%  Recent  Ratio 81% FEV1 56% FVC 51% DLCO 51% Fef 25/75 69%  No signs of infection at this time No signs of CHF at this time  6MWT WNL no evidence of drop in oxygen levels Patient exercises daily greater than 1 mile per day approximately one to 2 miles of exercise daily U no signs of infection at this time ses albuterol infrequently   Current Medication:  Current Outpatient Prescriptions:  .  aspirin 81 MG tablet, Take 81 mg by mouth every other day. , Disp: , Rfl:  .  azaTHIOprine (IMURAN) 50 MG tablet, Take 150 mg by mouth daily. , Disp: , Rfl:  .  CHLOROTHIAZIDE PO, Take by mouth., Disp: , Rfl:  .  gabapentin (NEURONTIN) 300 MG capsule, Take 300 mg by mouth 2 (two) times daily., Disp: , Rfl:  .  HYDROcodone-acetaminophen (NORCO/VICODIN) 5-325 MG tablet, Take 0.5 tablets by mouth 2 (two) times daily as needed for moderate pain., Disp: , Rfl:  .  levothyroxine (SYNTHROID, LEVOTHROID) 88 MCG tablet, Take  88 mcg by mouth daily.  , Disp: , Rfl:  .  losartan (COZAAR) 50 MG tablet, Take 50 mg by mouth 2 (two) times daily. , Disp: , Rfl:  .  montelukast (SINGULAIR) 10 MG tablet, Take 10 mg by mouth at bedtime., Disp: , Rfl:  .  pantoprazole (PROTONIX) 40 MG tablet, Take 40 mg by mouth daily., Disp: , Rfl:  .  Polyethylene Glycol 3350 (MIRALAX PO), Take by mouth., Disp: , Rfl:  .  predniSONE (DELTASONE) 20 MG tablet, Take 40 mg by mouth every other day., Disp: , Rfl:  .  pyridostigmine (MESTINON) 60 MG tablet, Take 60 mg by mouth daily. , Disp: , Rfl:  .  SPIRIVA HANDIHALER 18 MCG inhalation capsule, INHALE THE ENTIRE CONTENTS OF 1 CAPSULE ONCE A DAY USING HANDIHALER DEVICE, Disp: 30 capsule, Rfl: 5 .  TOLTERODINE TARTRATE PO, Take by mouth., Disp: , Rfl:     ALLERGIES   Azithromycin and Codeine     REVIEW OF SYSTEMS   Review of Systems  Constitutional: Negative for chills, diaphoresis, fever, malaise/fatigue and weight loss.  HENT: Negative for congestion.   Respiratory: Negative for cough, hemoptysis, sputum production, shortness of breath and wheezing.   Cardiovascular: Negative for chest pain.  Gastrointestinal: Negative for abdominal pain, heartburn, nausea and vomiting.  Skin: Negative for rash.  Neurological: Negative for weakness.  All other systems reviewed and are negative.   BP 132/80 (BP Location: Left Arm, Cuff Size: Normal)   Pulse 78   Ht 5\' 10"  (1.778 m)   Wt 248 lb (112.5 kg)   SpO2 100%   BMI 35.58 kg/m     PHYSICAL EXAM  Physical Exam  Constitutional: He is oriented to person, place, and time. He appears well-developed and well-nourished. No distress.  HENT:  Mouth/Throat: No oropharyngeal exudate.  Cardiovascular: Normal rate, regular rhythm and normal heart sounds.   No murmur heard. Pulmonary/Chest: No respiratory distress. He has no wheezes.  Neurological: He is alert and oriented to person, place, and time.  Skin: He is not diaphoretic.    Psychiatric: He has a normal mood and affect.         ASSESSMENT/PLAN   71 yo white male with Mild COPD Stage A with h/o MG,  PFT c/w with restrictive lung disease with NL 6MWT With chronic hypoxic respiratory failure in the setting of morbid obesity and deconditioned state    #1 COPD (chronic obstructive pulmonary disease) with Restrictive Lung disease Albuterol as needed -continue Spiriva-breathing has improved since restarting -Recommend weight loss  #2 Myasthenia gravis -Seems to be in remission at this time -on oxygen at night time at nighttime -follow up with [facility] -on prednisone and mestinon  #3 untreated OSA Patient refuses CPAP therapy at this time  #4 Obesity -recommend significant weight loss -recommend changing diet  #5 Deconditioned state -Recommend increased daily activity and exercise    Follow up in 6 months .  Patient is satisfied with Plan of action and management.   Corrin Parker, M.D.  [facility]  Medical Director [place] Director [facility] Cardio-Pulmonary Department

## 2017-03-21 ENCOUNTER — Encounter (INDEPENDENT_AMBULATORY_CARE_PROVIDER_SITE_OTHER): Payer: Self-pay | Admitting: Vascular Surgery

## 2017-03-21 ENCOUNTER — Ambulatory Visit (INDEPENDENT_AMBULATORY_CARE_PROVIDER_SITE_OTHER): Payer: Managed Care, Other (non HMO)

## 2017-03-21 ENCOUNTER — Ambulatory Visit (INDEPENDENT_AMBULATORY_CARE_PROVIDER_SITE_OTHER): Payer: Managed Care, Other (non HMO) | Admitting: Vascular Surgery

## 2017-03-21 VITALS — BP 122/73 | HR 72 | Resp 16 | Wt 249.0 lb

## 2017-03-21 DIAGNOSIS — I6523 Occlusion and stenosis of bilateral carotid arteries: Secondary | ICD-10-CM | POA: Diagnosis not present

## 2017-03-21 DIAGNOSIS — J439 Emphysema, unspecified: Secondary | ICD-10-CM | POA: Diagnosis not present

## 2017-03-21 DIAGNOSIS — I10 Essential (primary) hypertension: Secondary | ICD-10-CM

## 2017-03-21 NOTE — Progress Notes (Signed)
MRN : [id number]  Francisco [name] is a 71 y.o. ([birth year]) male who presents with chief complaint of  Chief Complaint  Patient presents with  . Re-evaluation  .  History of Present Illness: Patient returns in follow-up of his carotid disease. He's had no major changes or problems since his last visit. He denies any focal neurologic symptoms. Specifically, the patient denies amaurosis fugax, speech or swallowing difficulties, or arm or leg weakness or numbness. His carotid duplex today does show some slight progression in his right carotid artery now measuring in the lower end of the 60-79% range. His left carotid artery remains in the 40-59% range.  Current Outpatient Prescriptions  Medication Sig Dispense Refill  . aspirin 81 MG tablet Take 81 mg by mouth every other day.     . azaTHIOprine (IMURAN) 50 MG tablet Take 150 mg by mouth daily.     . CHLOROTHIAZIDE PO Take by mouth.    . gabapentin (NEURONTIN) 300 MG capsule Take 300 mg by mouth 2 (two) times daily.    Marland Kitchen HYDROcodone-acetaminophen (NORCO/VICODIN) 5-325 MG tablet Take 0.5 tablets by mouth 2 (two) times daily as needed for moderate pain.    Marland Kitchen levothyroxine (SYNTHROID, LEVOTHROID) 88 MCG tablet Take 88 mcg by mouth daily.      Marland Kitchen losartan (COZAAR) 50 MG tablet Take 50 mg by mouth 2 (two) times daily.     . montelukast (SINGULAIR) 10 MG tablet Take 10 mg by mouth at bedtime.    . pantoprazole (PROTONIX) 40 MG tablet Take 40 mg by mouth daily.    . Polyethylene Glycol 3350 (MIRALAX PO) Take by mouth.    . predniSONE (DELTASONE) 20 MG tablet Take 40 mg by mouth every other day.    . pyridostigmine (MESTINON) 60 MG tablet Take 60 mg by mouth daily.     Marland Kitchen SPIRIVA HANDIHALER 18 MCG inhalation capsule INHALE THE ENTIRE CONTENTS OF 1 CAPSULE ONCE A DAY USING HANDIHALER DEVICE 30 capsule 5  . TOLTERODINE TARTRATE PO Take by mouth.     No current facility-administered medications for this visit.     Past Medical History:    Diagnosis Date  . Bronchitis, chronic ([place])   . 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 COPD (chronic obstructive pulmonary disease) ([place])   . Hypertension    doesn't have a cardiologist;pt is maintained by medical md for htn;requested ekg[facility]cxr from [facility]  . Hypothyroidism    pt takes Levothyroxine daily  . Myasthenia gravis, adult form ([place])   . Shortness of breath    Lung MD- Dr Darlin Coco  . Sleep apnea    do not use CPAP every night    Past Surgical History:  Procedure Laterality Date  . ANTERIOR CERVICAL DECOMP/DISCECTOMY FUSION  07/18/2011   Procedure: ANTERIOR CERVICAL DECOMPRESSION/DISCECTOMY FUSION 2 LEVELS;  Surgeon: Cooper Render Pool;  Location: [place] [facility];  Service: Neurosurgery;  Laterality: N/A;  cervical five-six, cervical six-seven anterior cervical discectomy and fusion  . BACK SURGERY     in [place]     2005 at [facility]  . HERNIA REPAIR Left    inguinal hernia repair in 1997  . LUMBAR LAMINECTOMY/DECOMPRESSION MICRODISCECTOMY Left 02/24/2014   Procedure: LUMBAR LAMINECTOMY/DECOMPRESSION MICRODISCECTOMY LUMBAR THREE-FOUR, FOUR-FIVE, LEFT FIVE-SACRAL ONE ;  Surgeon: Charlie Pitter, MD;  Location: [place] [facility];  Service: Neurosurgery;  Laterality: Left;  LUMBAR LAMINECTOMY/DECOMPRESSION MICRODISCECTOMY LUMBAR THREE-FOUR, FOUR-FIVE, LEFT FIVE-SACRAL ONE   . PROSTATECTOMY  8/14   [facility] Dr Mare Ferrari     Social History  Social History        Social History  Substance Use Topics  . Smoking status: Former Smoker    Packs/day: 1.00    Years: 20.00    Types: Cigarettes    Quit date: 09/12/2001  . Smokeless tobacco: Never Used  . Alcohol use Yes      Comment: 6 beverages per week     Family History      Family History  Problem Relation Age of Onset  . Hypertension Mother   . Stroke Mother   . Stroke Father         Allergies   Allergen Reactions  . Codeine Nausea And Vomiting     REVIEW OF SYSTEMS (Negative unless checked)  Constitutional: [] Weight loss  [] Fever  [] Chills Cardiac: [] Chest pain   [] Chest pressure   [] Palpitations   [] Shortness of breath when laying flat   [] Shortness of breath at rest   [] Shortness of breath with exertion. Vascular:  [] Pain in legs with walking   [] Pain in legs at rest   [] Pain in legs when laying flat   [] Claudication   [] Pain in feet when walking  [] Pain in feet at rest  [] Pain in feet when laying flat   [] History of DVT   [] Phlebitis   [] Swelling in legs   [] Varicose veins   [] Non-healing ulcers Pulmonary:   [] Uses home oxygen   [] Productive cough   [] Hemoptysis   [] Wheeze  [x] COPD   [] Asthma Neurologic:  [] Dizziness  [] Blackouts   [] Seizures   [] History of stroke   [] History of TIA  [] Aphasia   [] Temporary blindness   [] Dysphagia   [] Weakness or numbness in arms   [] Weakness or numbness in legs Musculoskeletal:  [x] Arthritis   [] Joint swelling   [x] Joint pain   [x] Low back pain Hematologic:  [] Easy bruising  [] Easy bleeding   [] Hypercoagulable state   [] Anemic  [] Hepatitis Gastrointestinal:  [] Blood in stool   [] Vomiting blood  [] Gastroesophageal reflux/heartburn   [] Difficulty swallowing. Genitourinary:  [] Chronic kidney disease   [] Difficult urination  [] Frequent urination  [] Burning with urination   [] Blood in urine Skin:  [] Rashes   [] Ulcers   [] Wounds Psychological:  [] History of anxiety   []  History of major depression.   Physical Examination  Vitals:   03/21/17 1018  BP: 122/73  Pulse: 72  Resp: 16  Weight: 249 lb (112.9 kg)   Body mass index is 35.73 kg/m. Gen:  WD/WN, NAD Head: [place]/AT, No temporalis wasting. Ear/Nose/Throat: Hearing grossly intact, nares w/o erythema or drainage, trachea midline Eyes: Conjunctiva clear. Sclera non-icteric Neck: Supple.  No JVD. Soft right carotid bruit Pulmonary:  Good air movement, equal and clear to auscultation  bilaterally.  Cardiac: RRR, normal S1, S2, no Murmurs, rubs or gallops. Vascular:  Vessel Right Left  Radial Palpable Palpable                                    Musculoskeletal: M/S 5/5 throughout.  No deformity or atrophy.  Neurologic: CN 2-12 intact. Sensation grossly intact in extremities.  Symmetrical.  Speech is fluent. Motor exam as listed above. Psychiatric: Judgment intact, Mood & affect appropriate for pt's clinical situation. Dermatologic: No rashes or ulcers noted.  No cellulitis or open wounds.  CBC Lab Results  Component Value Date   WBC 9.3 03/03/2014   HGB 9.2 (L) 03/03/2014   HCT 26.5 (L) 03/04/2014   MCV 96 03/03/2014   PLT 360 03/03/2014    BMET    Component Value Date/Time   NA 141 03/04/2014 0450   K 3.9 03/04/2014 0450   CL 104 03/04/2014 0450   CO2 28 03/04/2014 0450   GLUCOSE 100 (H) 03/04/2014 0450   BUN 25 (H) 03/04/2014 0450   CREATININE 0.99 03/04/2014 0450   CALCIUM 9.0 03/04/2014 0450   GFRNONAA >60 03/04/2014 0450   GFRAA >60 03/04/2014 0450   CrCl cannot be calculated (Patient's most recent lab result is older than the maximum 21 days allowed.).  COAG No results found for: INR, PROTIME  Radiology No results found.   Assessment/Plan COPD (chronic obstructive pulmonary disease) Followed by Pulmonology  Essential hypertension, benign blood pressure control important in reducing the progression of atherosclerotic disease. On appropriate oral medications.  Carotid stenosis His carotid duplex today does show some slight progression in his right carotid artery now measuring in the lower end of the 60-79% range. His left carotid artery remains in the 40-59% range. At this point, he is still below the threshold for repair. We will need to follow this reasonably closely and I will plan to see him back in 6 months.    Leotis Pain, MD  03/21/2017 11:45 AM    This note was created with Dragon medical transcription  system.  Any errors from dictation are purely unintentional

## 2017-03-21 NOTE — Patient Instructions (Signed)
Carotid Artery Disease The carotid arteries are arteries on both sides of the neck. They carry blood to the brain. Carotid artery disease is when the arteries get smaller (narrow) or get blocked. If these arteries get smaller or get blocked, you are more likely to have a stroke or warning stroke (transient ischemic attack). Follow these instructions at home:  Take medicines as told by your doctor. Make sure you understand all your medicine instructions. Do not stop your medicines without talking to your doctor first.  Follow your doctor's diet instructions. It is important to eat a healthy diet that includes plenty of: ? Fresh fruits. ? Vegetables. ? Lean meats.  Avoid: ? High-fat foods. ? High-sodium foods. ? Foods that are fried, overly processed, or have poor nutritional value.  Stay a healthy weight.  Stay active. Get at least 30 minutes of activity every day.  Do not smoke.  Limit alcohol use to: ? No more than 2 drinks a day for men. ? No more than 1 drink a day for women who are not pregnant.  Do not use illegal drugs.  Keep all doctor visits as told. Get help right away if:  You have sudden weakness or loss of feeling (numbness) on one side of the body, such as the face, arm, or leg.  You have sudden confusion.  You have trouble speaking (aphasia) or understanding.  You have sudden trouble seeing out of one or both eyes.  You have sudden trouble walking.  You have dizziness or feel like you might pass out (faint).  You have a loss of balance or your movements are not steady (uncoordinated).  You have a sudden, severe headache with no known cause.  You have trouble swallowing (dysphagia). Call your local emergency services (911 in [place] Do notdrive yourself to the clinic or hospital. This information is not intended to replace advice given to you by your health care provider. Make sure you discuss any questions you have with your health care  provider. Document Released: 08/15/2012 Document Revised: 02/04/2016 Document Reviewed: 02/27/2013 Elsevier Interactive Patient Education  2018 [employer]  

## 2017-03-21 NOTE — Assessment & Plan Note (Signed)
His carotid duplex today does show some slight progression in his right carotid artery now measuring in the lower end of the 60-79% range. His left carotid artery remains in the 40-59% range. At this point, he is still below the threshold for repair. We will need to follow this reasonably closely and I will plan to see him back in 6 months.

## 2017-03-30 ENCOUNTER — Emergency Department
Admission: EM | Admit: 2017-03-30 | Discharge: 2017-03-30 | Disposition: A | Discharge status: Home / Self Care | Payer: Managed Care, Other (non HMO) | Attending: Emergency Medicine | Admitting: Emergency Medicine

## 2017-03-30 DIAGNOSIS — I10 Essential (primary) hypertension: Secondary | ICD-10-CM | POA: Diagnosis present

## 2017-03-30 DIAGNOSIS — Z5321 Procedure and treatment not carried out due to patient leaving prior to being seen by health care provider: Secondary | ICD-10-CM | POA: Diagnosis not present

## 2017-03-30 NOTE — ED Notes (Signed)
Pt denies any c/o CP, SHOB, dizziness, lightheadedness, weakness, N/V/D or pain. Pt reports being under increased stress d/t dealing with a daughter and her ETOH problems and reports he did not take his daily dose of HCTZ this AM. Pt states he feels better after seeing his BP has come down in Triage and states that he will decline staying to be seen by the EDP at this time. Pt given strict return precautions, and told to return immediately if s/x's of CP, SHOB, dizziness, lightheadedness, weakness, N/V/D, headache, tinnitus, or other concerning s/x's occur.

## 2017-05-04 ENCOUNTER — Other Ambulatory Visit: Payer: Self-pay | Admitting: Neurosurgery

## 2017-05-04 DIAGNOSIS — M48062 Spinal stenosis, lumbar region with neurogenic claudication: Secondary | ICD-10-CM

## 2017-05-22 ENCOUNTER — Ambulatory Visit
Admission: RE | Admit: 2017-05-22 | Discharge: 2017-05-22 | Disposition: A | Discharge status: Home / Self Care | Payer: Managed Care, Other (non HMO) | Source: Ambulatory Visit | Attending: Neurosurgery | Admitting: Neurosurgery

## 2017-05-22 ENCOUNTER — Other Ambulatory Visit: Payer: Self-pay | Admitting: Neurosurgery

## 2017-05-22 DIAGNOSIS — M48062 Spinal stenosis, lumbar region with neurogenic claudication: Secondary | ICD-10-CM

## 2017-05-22 MED ORDER — METHYLPREDNISOLONE ACETATE 40 MG/ML INJ SUSP (RADIOLOG
120.0000 mg | Freq: Once | INTRAMUSCULAR | Status: AC
  Administered 2017-05-22: 120 mg via EPIDURAL

## 2017-05-22 MED ORDER — IOPAMIDOL (ISOVUE-M 200) INJECTION 41%
1.0000 mL | Freq: Once | INTRAMUSCULAR | Status: AC
  Administered 2017-05-22: 1 mL via EPIDURAL

## 2017-06-19 ENCOUNTER — Other Ambulatory Visit: Payer: Self-pay | Admitting: Neurosurgery

## 2017-06-19 DIAGNOSIS — M48062 Spinal stenosis, lumbar region with neurogenic claudication: Secondary | ICD-10-CM

## 2017-07-03 ENCOUNTER — Other Ambulatory Visit: Payer: Self-pay | Admitting: Neurosurgery

## 2017-07-03 ENCOUNTER — Ambulatory Visit
Admission: RE | Admit: 2017-07-03 | Discharge: 2017-07-03 | Disposition: A | Discharge status: Home / Self Care | Payer: Managed Care, Other (non HMO) | Source: Ambulatory Visit | Attending: Neurosurgery | Admitting: Neurosurgery

## 2017-07-03 DIAGNOSIS — M48062 Spinal stenosis, lumbar region with neurogenic claudication: Secondary | ICD-10-CM

## 2017-07-03 MED ORDER — IOPAMIDOL (ISOVUE-M 200) INJECTION 41%
1.0000 mL | Freq: Once | INTRAMUSCULAR | Status: AC
  Administered 2017-07-03: 1 mL via EPIDURAL

## 2017-07-03 MED ORDER — METHYLPREDNISOLONE ACETATE 40 MG/ML INJ SUSP (RADIOLOG
120.0000 mg | Freq: Once | INTRAMUSCULAR | Status: AC
  Administered 2017-07-03: 120 mg via EPIDURAL

## 2017-08-07 ENCOUNTER — Telehealth: Payer: Self-pay | Admitting: Internal Medicine

## 2017-08-07 MED ORDER — TIOTROPIUM BROMIDE MONOHYDRATE 18 MCG IN CAPS: ORAL_CAPSULE | RESPIRATORY_TRACT | 5 refills | Status: DC

## 2017-08-07 NOTE — Telephone Encounter (Signed)
°*  STAT* If patient is at the pharmacy, call can be transferred to refill team.   1. Which medications need to be refilled? (please list name of each medication and dose if known) Spiriva   2. Which pharmacy/location (including street and city if local pharmacy) is medication to be sent to? [facility] [address]   3. Do they need a 30 day or 90 day supply? 90 day

## 2017-08-07 NOTE — Telephone Encounter (Signed)
Rx submitted.ss

## 2017-08-08 ENCOUNTER — Other Ambulatory Visit: Payer: Self-pay

## 2017-08-08 MED ORDER — TIOTROPIUM BROMIDE MONOHYDRATE 18 MCG IN CAPS: ORAL_CAPSULE | RESPIRATORY_TRACT | 5 refills | Status: DC

## 2017-09-01 ENCOUNTER — Encounter: Payer: Self-pay | Admitting: Pulmonary Disease

## 2017-09-01 ENCOUNTER — Ambulatory Visit (INDEPENDENT_AMBULATORY_CARE_PROVIDER_SITE_OTHER): Payer: BLUE CROSS/BLUE SHIELD | Admitting: Pulmonary Disease

## 2017-09-01 VITALS — BP 136/80 | HR 87 | Ht 70.0 in | Wt 246.0 lb

## 2017-09-01 DIAGNOSIS — R942 Abnormal results of pulmonary function studies: Secondary | ICD-10-CM | POA: Diagnosis not present

## 2017-09-01 DIAGNOSIS — J9611 Chronic respiratory failure with hypoxia: Secondary | ICD-10-CM | POA: Diagnosis not present

## 2017-09-01 DIAGNOSIS — J449 Chronic obstructive pulmonary disease, unspecified: Secondary | ICD-10-CM | POA: Diagnosis not present

## 2017-09-01 DIAGNOSIS — E668 Other obesity: Secondary | ICD-10-CM

## 2017-09-01 DIAGNOSIS — I6523 Occlusion and stenosis of bilateral carotid arteries: Secondary | ICD-10-CM

## 2017-09-01 DIAGNOSIS — R0609 Other forms of dyspnea: Secondary | ICD-10-CM

## 2017-09-01 NOTE — Patient Instructions (Addendum)
Continue Spiriva Trial of Breo inhaler -1 inhalation daily.  Use 1 week on, one-week off, etc. to discern whether it improves your breathing at all If you feel that the [facility] inhaler is beneficial, contact us and we will change Spiriva to a new medication called Anoro Continue your excellent efforts at weight loss Follow-up in 4-6 months with either Dr. Mortimer Fries or me We will obtain repeat lung function tests and chest x-ray prior to follow-up

## 2017-09-05 ENCOUNTER — Encounter: Payer: Self-pay | Admitting: Pulmonary Disease

## 2017-09-05 NOTE — Progress Notes (Signed)
PROBLEMS: Moderate COPD Moderate obesity Mixed pattern of obstruction/restriction on PFTs Chronic prednisone (for myasthenia gravis) Mild to moderate exertional dyspnea   SUBJ: This is a routine reevaluation.  [name] is normally followed by Dr. Mortimer Fries.  He remains on Spiriva which he believes is modestly beneficial.  He rarely uses his albuterol rescue inhaler as he indicates that it "does not help".  He is working hard on weight loss which is difficult due to chronic prednisone.  He is presently on 40 mg/day for myasthenia.  He reports mild to moderate exertional dyspnea with little day-to-day variation. Denies CP, fever, purulent sputum, hemoptysis, LE edema and calf tenderness.  OBJ: Vitals:   09/01/17 1409 09/01/17 1416  BP:  136/80  Pulse:  87  SpO2:  94%  Weight: 111.6 kg (246 lb)   Height: 5\' 10"  (1.778 m)   RA  Gen: NAD rest HEENT: NCAT, sclerae white, oropharynx normal Neck: No LAN, no JVD noted Lungs: Mildly diminished BS, normal percussion note, no adventitious sounds Cardiovascular: Reg, no M noted Abdomen: Centripetal obesity, soft, NT, +BS Ext: no C/C/E Neuro: PERRL, EOMI, motor/sensory grossly intact Skin: No lesions noted  BMP Latest Ref Rng & Units 03/04/2014 03/03/2014 03/02/2014  Glucose 65 - 99 mg/dL 100(H) 90 99  BUN 7 - 18 mg/dL 25(H) 23(H) 27(H)  Creatinine 0.60 - 1.30 mg/dL 0.99 1.14 1.20  Sodium 136 - 145 mmol/L 141 140 142  Potassium 3.5 - 5.1 mmol/L 3.9 3.5 3.9  Chloride 98 - 107 mmol/L 104 104 110(H)  CO2 21 - 32 mmol/L 28 30 26   Calcium 8.5 - 10.1 mg/dL 9.0 9.2 8.9    CBC Latest Ref Rng & Units 03/04/2014 03/03/2014 03/02/2014  WBC 3.8 - 10.6 x10 3/mm 3 - 9.3 9.4  Hemoglobin 13.0 - 18.0 g/dL - 9.2(L) 8.8(L)  Hematocrit 40.0 - 52.0 % 26.5(L) 29.3(L) 26.3(L)  Platelets 150 - 440 x10 3/mm 3 - 360 320    No recent CXR  IMPRESSION: COPD, moderate ([place]) - Plan: DG Chest 2 View, Pulmonary Function Test [facility] Only  DOE (dyspnea on  exertion)  Moderate obesity  Chronic respiratory failure with hypoxia (HCC)  Restrictive pattern present on pulmonary function testing  PLAN/REC: -Continue Spiriva -Trial of Breo inhaler -instructed to use 1 week on, one-week off, etc. to discern whether it improves his DOE If he feels that [facility] is beneficial, he is to contact us and we will change Spiriva to Anoro (we did not have Anoro samples to provide those) -Continue your excellent efforts at weight loss -Follow-up in 4-6 months with either Dr. Mortimer Fries or me with repeat PFTs and CXR prior to that visit   Merton Border, MD PCCM service Mobile [phone] Pager [phone] 09/05/2017 2:35 PM

## 2017-09-11 ENCOUNTER — Other Ambulatory Visit: Payer: Self-pay | Admitting: Neurosurgery

## 2017-09-11 DIAGNOSIS — M48062 Spinal stenosis, lumbar region with neurogenic claudication: Secondary | ICD-10-CM

## 2017-09-21 ENCOUNTER — Inpatient Hospital Stay
Admission: RE | Admit: 2017-09-21 | Discharge: 2017-09-21 | Disposition: A | Discharge status: Home / Self Care | Payer: Medicare Other | Source: Ambulatory Visit | Attending: Neurosurgery | Admitting: Neurosurgery

## 2017-09-22 ENCOUNTER — Ambulatory Visit (INDEPENDENT_AMBULATORY_CARE_PROVIDER_SITE_OTHER): Payer: Medicare Other | Admitting: Vascular Surgery

## 2017-09-22 ENCOUNTER — Ambulatory Visit (INDEPENDENT_AMBULATORY_CARE_PROVIDER_SITE_OTHER): Payer: Medicare Other

## 2017-09-22 ENCOUNTER — Encounter (INDEPENDENT_AMBULATORY_CARE_PROVIDER_SITE_OTHER): Payer: Self-pay | Admitting: Vascular Surgery

## 2017-09-22 VITALS — BP 138/66 | HR 70 | Resp 18 | Ht 70.0 in | Wt 241.0 lb

## 2017-09-22 DIAGNOSIS — I6523 Occlusion and stenosis of bilateral carotid arteries: Secondary | ICD-10-CM

## 2017-09-22 DIAGNOSIS — I10 Essential (primary) hypertension: Secondary | ICD-10-CM | POA: Diagnosis not present

## 2017-09-22 DIAGNOSIS — J439 Emphysema, unspecified: Secondary | ICD-10-CM

## 2017-09-22 NOTE — Progress Notes (Signed)
MRN : [id number]  Francisco [name] is a 72 y.o. ([birth year]) male who presents with chief complaint of  Chief Complaint  Patient presents with  . Follow-up    6 month carotid f/u  .  History of Present Illness: Patient returns today in follow-up of his carotid disease.  He has lost about 20 pounds intentionally and looks good today.  He is doing well without specific complaints.  He denies any focal neurologic symptoms. Specifically, the patient denies amaurosis fugax, speech or swallowing difficulties, or arm or leg weakness or numbness.  His carotid duplex today reveals stable stenosis in the 60-79% range on the right and in the 40-59% range on the left.  This is similar to his previous studies.  Current Outpatient Prescriptions  Medication Sig Dispense Refill  . aspirin 81 MG tablet Take 81 mg by mouth every other day.     . azaTHIOprine (IMURAN) 50 MG tablet Take 150 mg by mouth daily.     . CHLOROTHIAZIDE PO Take by mouth.    . gabapentin (NEURONTIN) 300 MG capsule Take 300 mg by mouth 2 (two) times daily.    Marland Kitchen HYDROcodone-acetaminophen (NORCO/VICODIN) 5-325 MG tablet Take 0.5 tablets by mouth 2 (two) times daily as needed for moderate pain.    Marland Kitchen levothyroxine (SYNTHROID, LEVOTHROID) 88 MCG tablet Take 88 mcg by mouth daily.      Marland Kitchen losartan (COZAAR) 50 MG tablet Take 50 mg by mouth 2 (two) times daily.     . montelukast (SINGULAIR) 10 MG tablet Take 10 mg by mouth at bedtime.    . pantoprazole (PROTONIX) 40 MG tablet Take 40 mg by mouth daily.    . Polyethylene Glycol 3350 (MIRALAX PO) Take by mouth.    . predniSONE (DELTASONE) 20 MG tablet Take 40 mg by mouth every other day.    . pyridostigmine (MESTINON) 60 MG tablet Take 60 mg by mouth daily.     Marland Kitchen SPIRIVA HANDIHALER 18 MCG inhalation capsule INHALE THE ENTIRE CONTENTS OF 1 CAPSULE ONCE A DAY USING HANDIHALER DEVICE 30 capsule 5  . TOLTERODINE TARTRATE PO Take by mouth.     No current  facility-administered medications for this visit.         Past Medical History:  Diagnosis Date  . Bronchitis, chronic ([place])   . 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 COPD (chronic obstructive pulmonary disease) ([place])   . Hypertension    doesn't have a cardiologist;pt is maintained by medical md for htn;requ[facility] ekg[facility] from [facility]  . Hypothyroidism    pt takes Levothyroxine daily  . Myasthenia gravis, adult form ([place])   . Shortness of breath    Lung MD- Dr Darlin Coco  . Sleep apnea    do not use CPAP every night         Past Surgical History:  Procedure Laterality Date  . ANTERIOR CERVICAL DECOMP/DISCECTOMY FUSION  07/18/2011   Procedure: ANTERIOR CERVICAL DECOMPRESSION/DISCECTOMY FUSION 2 LEVELS;  Surgeon: Cooper Render Pool;  Location: [facility];  Service: Neurosurgery;  Laterality: N/A;  cervical five-six, cervical six-seven anterior cervical discectomy and fusion  . BACK SURGERY     in [place]     2005 at [facility]  . HERNIA REPAIR Left    inguinal hernia repair in 1997  . LUMBAR LAMINECTOMY/DECOMPRESSION MICRODISCECTOMY Left 02/24/2014   Procedure: LUMBAR LAMINECTOMY/DECOMPRESSION MICRODISCECTOMY  LUMBAR THREE-FOUR, FOUR-FIVE, LEFT FIVE-SACRAL ONE ;  Surgeon: Charlie Pitter, MD;  Location: [facility];  Service: Neurosurgery;  Laterality: Left;  LUMBAR LAMINECTOMY/DECOMPRESSION MICRODISCECTOMY LUMBAR THREE-FOUR, FOUR-FIVE, LEFT FIVE-SACRAL ONE   . PROSTATECTOMY  8/14   [facility] Dr Mare Ferrari       Social History        Social History  Substance Use Topics  . Smoking status: Former Smoker    Packs/day: 1.00    Years: 20.00    Types: Cigarettes    Quit date: 09/12/2001  . Smokeless tobacco: Never Used  . Alcohol use Yes      Comment: 6 beverages per week     Family History      Family History    Problem Relation Age of Onset  . Hypertension Mother   . Stroke Mother   . Stroke Father         Allergies  Allergen Reactions  . Codeine Nausea And Vomiting     REVIEW OF SYSTEMS(Negative unless checked)  Constitutional: [] Weight loss[] Fever[] Chills Cardiac:[] Chest pain[] Chest pressure[] Palpitations [] Shortness of breath when laying flat [] Shortness of breath at rest [] Shortness of breath with exertion. Vascular: [] Pain in legs with walking[] Pain in legsat rest[] Pain in legs when laying flat [] Claudication [] Pain in feet when walking [] Pain in feet at rest [] Pain in feet when laying flat [] History of DVT [] Phlebitis [] Swelling in legs [] Varicose veins [] Non-healing ulcers Pulmonary: [] Uses home oxygen [] Productive cough[] Hemoptysis [] Wheeze [x] COPD [] Asthma Neurologic: [] Dizziness [] Blackouts [] Seizures [] History of stroke [] History of TIA[] Aphasia [] Temporary blindness[] Dysphagia [] Weaknessor numbness in arms [] Weakness or numbnessin legs Musculoskeletal: [x] Arthritis [] Joint swelling [x] Joint pain [x] Low back pain Hematologic:[] Easy bruising[] Easy bleeding [] Hypercoagulable state [x] Anemic [] Hepatitis Gastrointestinal:[] Blood in stool[] Vomiting blood[] Gastroesophageal reflux/heartburn[] Difficulty swallowing. Genitourinary: [] Chronic kidney disease [] Difficulturination [] Frequenturination [] Burning with urination[] Blood in urine Skin: [] Rashes [] Ulcers [] Wounds Psychological: [] History of anxiety[] History of major depression.      Physical Examination  Vitals:   09/22/17 1042 09/22/17 1043  BP: 116/62 138/66  Pulse: 76 70  Resp: 18   Weight: 241 lb (109.3 kg)   Height: 5\' 10"  (1.778 m)    Body mass index is 34.58 kg/m. Gen:  WD/WN, NAD Head: [place]/AT, No temporalis wasting. Ear/Nose/Throat: Hearing grossly intact, nares w/o erythema or  drainage, trachea midline Eyes: Conjunctiva clear. Sclera non-icteric Neck: Supple.  Right carotid bruit is present Pulmonary:  Good air movement, equal and clear to auscultation bilaterally.  Cardiac: RRR, normal S1, S2 Vascular:  Vessel Right Left  Radial Palpable Palpable                                    Musculoskeletal: M/S 5/5 throughout.  No deformity or atrophy.  Neurologic: CN 2-12 intact. Sensation grossly intact in extremities.  Symmetrical.  Speech is fluent. Motor exam as listed above. Psychiatric: Judgment intact, Mood & affect appropriate for pt's clinical situation. Dermatologic: No rashes or ulcers noted.  No cellulitis or open wounds.      CBC Lab Results  Component Value Date   WBC 9.3 03/03/2014   HGB 9.2 (L) 03/03/2014   HCT 26.5 (L) 03/04/2014   MCV 96 03/03/2014   PLT 360 03/03/2014    BMET    Component Value Date/Time   NA 141 03/04/2014 0450   K 3.9 03/04/2014 0450   CL 104 03/04/2014 0450   CO2 28 03/04/2014 0450   GLUCOSE 100 (H) 03/04/2014 0450   BUN 25 (H) 03/04/2014 0450  CREATININE 0.99 03/04/2014 0450   CALCIUM 9.0 03/04/2014 0450   GFRNONAA >60 03/04/2014 0450   GFRAA >60 03/04/2014 0450   CrCl cannot be calculated (Patient's most recent lab result is older than the maximum 21 days allowed.).  COAG No results found for: INR, PROTIME  Radiology No results found.   Assessment/Plan COPD (chronic obstructive pulmonary disease) Followed by Pulmonology  Essential hypertension, benign blood pressure control important in reducing the progression of atherosclerotic disease. On appropriate oral medications.   Carotid stenosis His carotid duplex today reveals stable stenosis in the 60-79% range on the right and in the 40-59% range on the left.  This is similar to his previous studies. Recommend:  Given the patient's asymptomatic subcritical stenosis no further invasive testing or surgery at this time.  Continue  antiplatelet therapy as prescribed Continue management of CAD, HTN and Hyperlipidemia Healthy heart diet,  encouraged exercise at least 4 times per week Follow up in 6 months with duplex ultrasound and physical exam based on >50% stenosis of the right carotid artery        Leotis Pain, MD  09/22/2017 2:04 PM    This note was created with Dragon medical transcription system.  Any errors from dictation are purely unintentional

## 2017-09-22 NOTE — Patient Instructions (Signed)
Carotid Artery Disease The carotid arteries are arteries on both sides of the neck. They carry blood to the brain. Carotid artery disease is when the arteries get smaller (narrow) or get blocked. If these arteries get smaller or get blocked, you are more likely to have a stroke or warning stroke (transient ischemic attack). Follow these instructions at home:  Take medicines as told by your doctor. Make sure you understand all your medicine instructions. Do not stop your medicines without talking to your doctor first.  Follow your doctor's diet instructions. It is important to eat a healthy diet that includes plenty of: ? Fresh fruits. ? Vegetables. ? Lean meats.  Avoid: ? High-fat foods. ? High-sodium foods. ? Foods that are fried, overly processed, or have poor nutritional value.  Stay a healthy weight.  Stay active. Get at least 30 minutes of activity every day.  Do not smoke.  Limit alcohol use to: ? No more than 2 drinks a day for men. ? No more than 1 drink a day for women who are not pregnant.  Do not use illegal drugs.  Keep all doctor visits as told. Get help right away if:  You have sudden weakness or loss of feeling (numbness) on one side of the body, such as the face, arm, or leg.  You have sudden confusion.  You have trouble speaking (aphasia) or understanding.  You have sudden trouble seeing out of one or both eyes.  You have sudden trouble walking.  You have dizziness or feel like you might pass out (faint).  You have a loss of balance or your movements are not steady (uncoordinated).  You have a sudden, severe headache with no known cause.  You have trouble swallowing (dysphagia). Call your local emergency services (911 in [place] Do notdrive yourself to the clinic or hospital. This information is not intended to replace advice given to you by your health care provider. Make sure you discuss any questions you have with your health care  provider. Document Released: 08/15/2012 Document Revised: 02/04/2016 Document Reviewed: 02/27/2013 Elsevier Interactive Patient Education  2018 [employer]  

## 2017-09-22 NOTE — Assessment & Plan Note (Signed)
His carotid duplex today reveals stable stenosis in the 60-79% range on the right and in the 40-59% range on the left.  This is similar to his previous studies. Recommend:  Given the patient's asymptomatic subcritical stenosis no further invasive testing or surgery at this time.  Continue antiplatelet therapy as prescribed Continue management of CAD, HTN and Hyperlipidemia Healthy heart diet,  encouraged exercise at least 4 times per week Follow up in 6 months with duplex ultrasound and physical exam based on >50% stenosis of the right carotid artery

## 2017-10-02 ENCOUNTER — Other Ambulatory Visit: Payer: Self-pay | Admitting: Neurosurgery

## 2017-10-02 ENCOUNTER — Ambulatory Visit
Admission: RE | Admit: 2017-10-02 | Discharge: 2017-10-02 | Disposition: A | Discharge status: Home / Self Care | Payer: BLUE CROSS/BLUE SHIELD | Source: Ambulatory Visit | Attending: Neurosurgery | Admitting: Neurosurgery

## 2017-10-02 DIAGNOSIS — M48062 Spinal stenosis, lumbar region with neurogenic claudication: Secondary | ICD-10-CM

## 2017-10-02 MED ORDER — METHYLPREDNISOLONE ACETATE 40 MG/ML INJ SUSP (RADIOLOG
120.0000 mg | Freq: Once | INTRAMUSCULAR | Status: AC
  Administered 2017-10-02: 120 mg via EPIDURAL

## 2017-10-02 MED ORDER — IOPAMIDOL (ISOVUE-M 200) INJECTION 41%
1.0000 mL | Freq: Once | INTRAMUSCULAR | Status: AC
  Administered 2017-10-02: 1 mL via EPIDURAL

## 2017-10-02 NOTE — Discharge Instructions (Signed)

## 2017-11-08 ENCOUNTER — Other Ambulatory Visit: Payer: Self-pay | Admitting: *Deleted

## 2017-11-08 MED ORDER — FLUTICASONE FUROATE-VILANTEROL 100-25 MCG/INH IN AEPB: 1.0000 | INHALATION_SPRAY | Freq: Every day | RESPIRATORY_TRACT | 5 refills | Status: DC

## 2017-11-14 ENCOUNTER — Other Ambulatory Visit: Payer: Self-pay | Admitting: Neurosurgery

## 2017-11-14 DIAGNOSIS — M48062 Spinal stenosis, lumbar region with neurogenic claudication: Secondary | ICD-10-CM

## 2017-11-17 ENCOUNTER — Other Ambulatory Visit: Payer: BLUE CROSS/BLUE SHIELD

## 2017-12-01 ENCOUNTER — Other Ambulatory Visit: Payer: Self-pay | Admitting: Neurosurgery

## 2017-12-01 ENCOUNTER — Ambulatory Visit
Admission: RE | Admit: 2017-12-01 | Discharge: 2017-12-01 | Disposition: A | Discharge status: Home / Self Care | Payer: BLUE CROSS/BLUE SHIELD | Source: Ambulatory Visit | Attending: Neurosurgery | Admitting: Neurosurgery

## 2017-12-01 DIAGNOSIS — M48062 Spinal stenosis, lumbar region with neurogenic claudication: Secondary | ICD-10-CM

## 2017-12-01 MED ORDER — METHYLPREDNISOLONE ACETATE 40 MG/ML INJ SUSP (RADIOLOG
120.0000 mg | Freq: Once | INTRAMUSCULAR | Status: AC
  Administered 2017-12-01: 120 mg via EPIDURAL

## 2017-12-01 MED ORDER — IOPAMIDOL (ISOVUE-M 200) INJECTION 41%
1.0000 mL | Freq: Once | INTRAMUSCULAR | Status: AC
  Administered 2017-12-01: 1 mL via EPIDURAL

## 2018-01-25 ENCOUNTER — Ambulatory Visit: Payer: Medicare Other | Attending: Pulmonary Disease

## 2018-01-31 ENCOUNTER — Other Ambulatory Visit: Payer: Self-pay | Admitting: Neurosurgery

## 2018-01-31 DIAGNOSIS — M48062 Spinal stenosis, lumbar region with neurogenic claudication: Secondary | ICD-10-CM

## 2018-02-12 ENCOUNTER — Ambulatory Visit
Admission: RE | Admit: 2018-02-12 | Discharge: 2018-02-12 | Disposition: A | Discharge status: Home / Self Care | Payer: BLUE CROSS/BLUE SHIELD | Source: Ambulatory Visit | Attending: Neurosurgery | Admitting: Neurosurgery

## 2018-02-12 DIAGNOSIS — M48062 Spinal stenosis, lumbar region with neurogenic claudication: Secondary | ICD-10-CM

## 2018-02-12 MED ORDER — METHYLPREDNISOLONE ACETATE 40 MG/ML INJ SUSP (RADIOLOG
120.0000 mg | Freq: Once | INTRAMUSCULAR | Status: AC
  Administered 2018-02-12: 120 mg via EPIDURAL

## 2018-02-12 MED ORDER — IOPAMIDOL (ISOVUE-M 200) INJECTION 41%
1.0000 mL | Freq: Once | INTRAMUSCULAR | Status: AC
  Administered 2018-02-12: 1 mL via EPIDURAL

## 2018-02-14 ENCOUNTER — Other Ambulatory Visit: Payer: Self-pay | Admitting: Student

## 2018-02-14 DIAGNOSIS — M5412 Radiculopathy, cervical region: Secondary | ICD-10-CM

## 2018-02-14 DIAGNOSIS — R221 Localized swelling, mass and lump, neck: Secondary | ICD-10-CM

## 2018-02-14 DIAGNOSIS — M4322 Fusion of spine, cervical region: Secondary | ICD-10-CM

## 2018-02-15 ENCOUNTER — Ambulatory Visit: Payer: BLUE CROSS/BLUE SHIELD | Admitting: Pulmonary Disease

## 2018-02-15 ENCOUNTER — Other Ambulatory Visit: Payer: Self-pay | Admitting: Student

## 2018-02-15 DIAGNOSIS — M5412 Radiculopathy, cervical region: Secondary | ICD-10-CM

## 2018-02-15 DIAGNOSIS — R221 Localized swelling, mass and lump, neck: Secondary | ICD-10-CM

## 2018-02-28 ENCOUNTER — Ambulatory Visit: Admission: RE | Admit: 2018-02-28 | Payer: BLUE CROSS/BLUE SHIELD | Source: Ambulatory Visit

## 2018-02-28 ENCOUNTER — Ambulatory Visit
Admission: RE | Admit: 2018-02-28 | Discharge: 2018-02-28 | Disposition: A | Discharge status: Home / Self Care | Payer: BLUE CROSS/BLUE SHIELD | Source: Ambulatory Visit | Attending: Student | Admitting: Student

## 2018-02-28 DIAGNOSIS — M4322 Fusion of spine, cervical region: Secondary | ICD-10-CM | POA: Diagnosis not present

## 2018-02-28 DIAGNOSIS — M4802 Spinal stenosis, cervical region: Secondary | ICD-10-CM | POA: Insufficient documentation

## 2018-02-28 DIAGNOSIS — M542 Cervicalgia: Secondary | ICD-10-CM | POA: Insufficient documentation

## 2018-02-28 DIAGNOSIS — M1288 Other specific arthropathies, not elsewhere classified, other specified site: Secondary | ICD-10-CM | POA: Insufficient documentation

## 2018-02-28 DIAGNOSIS — R200 Anesthesia of skin: Secondary | ICD-10-CM | POA: Insufficient documentation

## 2018-02-28 DIAGNOSIS — M5412 Radiculopathy, cervical region: Secondary | ICD-10-CM | POA: Diagnosis present

## 2018-02-28 DIAGNOSIS — M5021 Other cervical disc displacement,  high cervical region: Secondary | ICD-10-CM | POA: Diagnosis not present

## 2018-02-28 DIAGNOSIS — R221 Localized swelling, mass and lump, neck: Secondary | ICD-10-CM | POA: Insufficient documentation

## 2018-03-06 ENCOUNTER — Ambulatory Visit (INDEPENDENT_AMBULATORY_CARE_PROVIDER_SITE_OTHER): Payer: BLUE CROSS/BLUE SHIELD | Admitting: Pulmonary Disease

## 2018-03-06 ENCOUNTER — Encounter: Payer: Self-pay | Admitting: Pulmonary Disease

## 2018-03-06 VITALS — BP 136/84 | HR 67 | Ht 70.0 in | Wt 243.0 lb

## 2018-03-06 DIAGNOSIS — R942 Abnormal results of pulmonary function studies: Secondary | ICD-10-CM | POA: Diagnosis not present

## 2018-03-06 DIAGNOSIS — I6523 Occlusion and stenosis of bilateral carotid arteries: Secondary | ICD-10-CM

## 2018-03-06 DIAGNOSIS — R0609 Other forms of dyspnea: Secondary | ICD-10-CM | POA: Diagnosis not present

## 2018-03-06 DIAGNOSIS — G7000 Myasthenia gravis without (acute) exacerbation: Secondary | ICD-10-CM | POA: Diagnosis not present

## 2018-03-06 DIAGNOSIS — E668 Other obesity: Secondary | ICD-10-CM

## 2018-03-06 DIAGNOSIS — J449 Chronic obstructive pulmonary disease, unspecified: Secondary | ICD-10-CM | POA: Diagnosis not present

## 2018-03-06 DIAGNOSIS — E669 Obesity, unspecified: Secondary | ICD-10-CM

## 2018-03-06 MED ORDER — TIOTROPIUM BROMIDE MONOHYDRATE 18 MCG IN CAPS: ORAL_CAPSULE | RESPIRATORY_TRACT | 5 refills | Status: DC

## 2018-03-06 NOTE — Patient Instructions (Signed)
Stop Breo inhaler Resume Spiriva inhaler.  2 inhalations daily.  Prescription entered. Lung function tests and chest x-ray ordered Follow-up with Dr. Mortimer Fries in 4 to 6 weeks to review the above test results

## 2018-03-07 NOTE — Progress Notes (Signed)
PROBLEMS: Moderate COPD Moderate obesity Mixed pattern of obstruction/restriction on PFTs Chronic prednisone (for myasthenia gravis) Mild to moderate exertional dyspnea   SUBJ: This is a scheduled office visit that was supposed to be with Dr. Mortimer Fries.  He was last seen in December 2018 by me.  At that time, I ordered repeat PFTs and CXR which have not been performed.  He has had no major pulmonary events since last visit.  However, he is completing a course of amoxicillin for mild bronchitis.  He had cough with mildly purulent sputum which is now improved.  He remains on prednisone at 40 mg daily for myasthenia gravis.  He notes some increase in dysphagia attributed to the myasthenia.  Last visit, I recommended that we try Breo inhaler in place of Spiriva.  He has undertaken this trial but prefers Spiriva. He denies CP, fever, purulent sputum, hemoptysis, LE edema and calf tenderness.  He continues to work on weight loss following the [employer] program.  However, this has been difficult due to the high dose of prednisone.  OBJ: Vitals:   03/06/18 1043 03/06/18 1049  BP:  136/84  Pulse:  67  SpO2:  98%  Weight: 243 lb (110.2 kg)   Height: 5\' 10"  (1.778 m)   RA  Gen: NAD  HEENT: NCAT, sclerae white Neck: No JVD noted Lungs: BS full, no wheezes or other adventitious sounds Cardiovascular: Regular, no M Abdomen: Centripetal obesity, soft, NT, NABS Ext: No C/C/E Neuro: No focal deficits Skin: No lesions noted on limited exam  BMP Latest Ref Rng & Units 03/04/2014 03/03/2014 03/02/2014  Glucose 65 - 99 mg/dL 100(H) 90 99  BUN 7 - 18 mg/dL 25(H) 23(H) 27(H)  Creatinine 0.60 - 1.30 mg/dL 0.99 1.14 1.20  Sodium 136 - 145 mmol/L 141 140 142  Potassium 3.5 - 5.1 mmol/L 3.9 3.5 3.9  Chloride 98 - 107 mmol/L 104 104 110(H)  CO2 21 - 32 mmol/L 28 30 26   Calcium 8.5 - 10.1 mg/dL 9.0 9.2 8.9    CBC Latest Ref Rng & Units 03/04/2014 03/03/2014 03/02/2014  WBC 3.8 - 10.6 x10 3/mm 3 - 9.3 9.4   Hemoglobin 13.0 - 18.0 g/dL - 9.2(L) 8.8(L)  Hematocrit 40.0 - 52.0 % 26.5(L) 29.3(L) 26.3(L)  Platelets 150 - 440 x10 3/mm 3 - 360 320    No recent CXR  IMPRESSION: COPD, moderate ([place]) - Plan: Pulmonary Function Test [facility] Only  Moderate obesity  Restrictive pattern present on pulmonary function testing  Myasthenia gravis ([place])  DOE (dyspnea on exertion)  PLAN/REC: Change Breo back to Spiriva HandiHaler, 1 capsule daily.  Prescription reentered Follow-up with Dr. Mortimer Fries in 4 to 6 weeks after previously ordered PFTs and CXR are performed   Merton Border, MD PCCM service Mobile [phone] Pager [phone] 03/07/2018 11:52 AM

## 2018-03-23 ENCOUNTER — Ambulatory Visit (INDEPENDENT_AMBULATORY_CARE_PROVIDER_SITE_OTHER): Payer: BLUE CROSS/BLUE SHIELD | Admitting: Vascular Surgery

## 2018-03-23 ENCOUNTER — Encounter (INDEPENDENT_AMBULATORY_CARE_PROVIDER_SITE_OTHER): Payer: Medicare Other

## 2018-04-03 ENCOUNTER — Ambulatory Visit: Payer: BLUE CROSS/BLUE SHIELD | Attending: Pulmonary Disease

## 2018-04-09 ENCOUNTER — Ambulatory Visit (INDEPENDENT_AMBULATORY_CARE_PROVIDER_SITE_OTHER): Payer: BLUE CROSS/BLUE SHIELD | Admitting: Internal Medicine

## 2018-04-09 ENCOUNTER — Encounter: Payer: Self-pay | Admitting: Internal Medicine

## 2018-04-09 ENCOUNTER — Ambulatory Visit: Payer: BLUE CROSS/BLUE SHIELD | Admitting: Internal Medicine

## 2018-04-09 VITALS — BP 112/72 | HR 71 | Resp 16 | Ht 70.0 in | Wt 247.0 lb

## 2018-04-09 DIAGNOSIS — I6523 Occlusion and stenosis of bilateral carotid arteries: Secondary | ICD-10-CM

## 2018-04-09 DIAGNOSIS — J449 Chronic obstructive pulmonary disease, unspecified: Secondary | ICD-10-CM

## 2018-04-09 NOTE — Patient Instructions (Signed)
Continue Spiriva  Continue meds as Prescribed by [facility]  Recommend weight loss-go down to 235 Lbs at next OV

## 2018-04-09 NOTE — Progress Notes (Signed)
[facility] Consultation      Date: 04/09/2018,   MRN# [id number] Francisco [name] 03-19-1946    Admission                  Current  Francisco [name] is a 72 y.o. old male seen in consultation for COPD     CHIEF COMPLAINT:   Follow up COPD   HISTORY OF PRESENT ILLNESS  DOE at baseline Doing well on spiriva -no wheezing, no fevers, chills Patient has no major complaints works as Hotel manager, qiut tobacco abuse 17 years ago   Had back surgery, skin graft of RT pointer finger for cancer, basal skin cancers no other issues Patient exercises daily walks about 1-2 miles per day   Patient has been previously diagnosed with obstructive sleep apnea however refuses CPAP therapy at this time Patient weighs 247 pounds Patient uses oxygen at night 2 L nasal cannula  Last PFT 12/2013 Ratio 60% FEV1 54% DLCO 60% FEF25/75 17%  2016 Ratio 81% FEV1 56% FVC 51% DLCO 51% Fef 25/75 69%  No signs of infection at this time No signs of CHF at this time  6MWT WNL no evidence of drop in oxygen levels 6 months ago Patient exercises daily greater than 1 mile per day approximately one to 2 miles of exercise daily U no signs of infection at this time ses albuterol infrequently   Current Medication:  Current Outpatient Medications:  .  aspirin 81 MG tablet, Take 81 mg by mouth every other day. , Disp: , Rfl:  .  azaTHIOprine (IMURAN) 50 MG tablet, Take 150 mg by mouth daily. , Disp: , Rfl:  .  CHLOROTHIAZIDE PO, Take by mouth., Disp: , Rfl:  .  gabapentin (NEURONTIN) 300 MG capsule, Take 300 mg by mouth 2 (two) times daily., Disp: , Rfl:  .  HYDROcodone-acetaminophen (NORCO/VICODIN) 5-325 MG tablet, Take 0.5 tablets by mouth 2 (two) times daily as needed for moderate pain., Disp: , Rfl:  .  levothyroxine (SYNTHROID, LEVOTHROID) 88 MCG tablet, Take 88 mcg by mouth daily.  , Disp: , Rfl:  .  losartan (COZAAR) 50 MG tablet, Take 50 mg by mouth 2 (two) times daily. ,  Disp: , Rfl:  .  MAGNESIUM PO, Take 400 mg by mouth 2 (two) times daily., Disp: , Rfl:  .  pantoprazole (PROTONIX) 40 MG tablet, Take 40 mg by mouth daily., Disp: , Rfl:  .  predniSONE (DELTASONE) 20 MG tablet, Take 40 mg by mouth every other day., Disp: , Rfl:  .  pyridostigmine (MESTINON) 60 MG tablet, Take 60 mg by mouth daily. , Disp: , Rfl:  .  tiotropium (SPIRIVA HANDIHALER) 18 MCG inhalation capsule, INHALE THE ENTIRE CONTENTS OF 1 CAPSULE ONCE A DAY USING HANDIHALER DEVICE, Disp: 30 capsule, Rfl: 5    ALLERGIES   Azithromycin and Codeine     REVIEW OF SYSTEMS   Review of Systems  Constitutional: Negative for chills, diaphoresis, fever, malaise/fatigue and weight loss.  HENT: Negative for congestion.   Respiratory: Negative for cough, hemoptysis, sputum production, shortness of breath and wheezing.   Cardiovascular: Negative for chest pain.  Gastrointestinal: Negative for abdominal pain, heartburn, nausea and vomiting.  Skin: Negative for rash.  Neurological: Negative for weakness.  All other systems reviewed and are negative.  BP 112/72 (BP Location: Left Arm, Cuff Size: Large)   Pulse 71   Resp 16   Ht 5\' 10"  (1.778 m)   Wt 247 lb (112  kg)   SpO2 96%   BMI 35.44 kg/m     PHYSICAL EXAM  Physical Exam  Constitutional: He is oriented to person, place, and time. He appears well-developed and well-nourished. No distress.  HENT:  Mouth/Throat: No oropharyngeal exudate.  Cardiovascular: Normal rate, regular rhythm and normal heart sounds.  No murmur heard. Pulmonary/Chest: No respiratory distress. He has no wheezes.  Neurological: He is alert and oriented to person, place, and time.  Skin: He is not diaphoretic.  Psychiatric: He has a normal mood and affect.         ASSESSMENT/PLAN   72 yo white male with Mild COPD Stage A with h/o MG,  PFT c/w with restrictive lung disease with NL 6MWT With chronic hypoxic respiratory failure in the setting of morbid  obesity and deconditioned state    #1 COPD (chronic obstructive pulmonary disease) with Restrictive Lung disease-mild Albuterol as needed -continue Spiriva-breathing has improved since restarting -Recommend weight loss No need for PFT's or CXR at this time  #2 Myasthenia gravis -Seems to be in remission at this time -on oxygen at night time at nighttime -follow up with [facility] -on prednisone and mestinon, Azothioprine  #3 untreated OSA Patient refuses CPAP therapy at this time  #4 Obesity -recommend significant weight loss -recommend changing diet Recommend weight loss to 235 at next OV  #5 Deconditioned state -Recommend increased daily activity and exercise    Follow up in 6 months  Patient is satisfied with Plan of action and management.   Corrin Parker, M.D.  [facility]  Medical Director [place] Director [facility] Cardio-Pulmonary Department

## 2018-05-24 ENCOUNTER — Other Ambulatory Visit: Payer: Self-pay | Admitting: Neurosurgery

## 2018-05-24 DIAGNOSIS — M48062 Spinal stenosis, lumbar region with neurogenic claudication: Secondary | ICD-10-CM

## 2018-05-25 ENCOUNTER — Ambulatory Visit (INDEPENDENT_AMBULATORY_CARE_PROVIDER_SITE_OTHER): Payer: BLUE CROSS/BLUE SHIELD | Admitting: Nurse Practitioner

## 2018-05-25 ENCOUNTER — Encounter (INDEPENDENT_AMBULATORY_CARE_PROVIDER_SITE_OTHER): Payer: Self-pay | Admitting: Nurse Practitioner

## 2018-05-25 ENCOUNTER — Ambulatory Visit (INDEPENDENT_AMBULATORY_CARE_PROVIDER_SITE_OTHER): Payer: BLUE CROSS/BLUE SHIELD

## 2018-05-25 ENCOUNTER — Encounter

## 2018-05-25 VITALS — BP 136/61 | HR 76 | Resp 16 | Wt 256.2 lb

## 2018-05-25 DIAGNOSIS — I6523 Occlusion and stenosis of bilateral carotid arteries: Secondary | ICD-10-CM

## 2018-05-25 DIAGNOSIS — I10 Essential (primary) hypertension: Secondary | ICD-10-CM

## 2018-05-25 DIAGNOSIS — E7800 Pure hypercholesterolemia, unspecified: Secondary | ICD-10-CM | POA: Diagnosis not present

## 2018-05-25 DIAGNOSIS — R600 Localized edema: Secondary | ICD-10-CM | POA: Diagnosis not present

## 2018-05-25 NOTE — Progress Notes (Signed)
Subjective:    Patient ID: Francisco [name], male    DOB: [birth year], 72 y.o.   MRN: [id number] Chief Complaint  Patient presents with  . Follow-up    6 month Carotid follow up    HPI  Francisco [name] is a 72 y.o. male who  is seen for follow up evaluation of carotid stenosis. The carotid stenosis followed by ultrasound.   The patient denies amaurosis fugax. There is no recent history of TIA symptoms or focal motor deficits. There is no prior documented CVA.  The patient is taking enteric-coated aspirin 81 mg daily.  There is no history of migraine headaches. There is no history of seizures.  The patient has a history of coronary artery disease, no recent episodes of angina or shortness of breath. The patient denies PAD or claudication symptoms. There is a history of hyperlipidemia which is being treated with a statin.    Carotid Duplex done today shows 60 to 79%, with 1 to 39% stenosis in the left carotid artery.  No change compared to last study in the 09/26/2017  The patient also complained of swelling of the legs.  He was scheduled to see his primary care provider in regards to his swelling.  Constitutional: [] Weight loss  [] Fever  [] Chills Cardiac: [] Chest pain   [] Chest pressure   [] Palpitations   [] Shortness of breath when laying flat   [] Shortness of breath with exertion. Vascular:  [x] Pain in legs with walking   [x] Pain in legs with standing  [] History of DVT   [] Phlebitis   [x] Swelling in legs   [] Varicose veins   [] Non-healing ulcers Pulmonary:   [] Uses home oxygen   [] Productive cough   [] Hemoptysis   [] Wheeze  [] COPD   [] Asthma Neurologic:  [] Dizziness   [] Seizures   [] History of stroke   [] History of TIA  [] Aphasia   [] Vissual changes   [] Weakness or numbness in arm   [] Weakness or numbness in leg Musculoskeletal:   [] Joint swelling   [] Joint pain   [x] Low back pain Hematologic:  [x] Easy bruising  [] Easy bleeding   [] Hypercoagulable state    [] Anemic Gastrointestinal:  [] Diarrhea   [] Vomiting  [] Gastroesophageal reflux/heartburn   [] Difficulty swallowing. Genitourinary:  [] Chronic kidney disease   [] Difficult urination  [x] Frequent urination   [] Blood in urine Skin:  [] Rashes   [] Ulcers  Psychological:  [] History of anxiety   []  History of major depression.     Objective:   Physical Exam  BP 136/61 (BP Location: Right Arm)   Pulse 76   Resp 16   Wt 256 lb 3.2 oz (116.2 kg)   BMI 36.76 kg/m   Past Medical History:  Diagnosis Date  . Bronchitis, chronic ([place])   . 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 COPD (chronic obstructive pulmonary disease) ([place])   . Hypertension    doesn't have a cardiologist;pt is maintained by medical md for htn;requ[facility] ekg[facility] from [facility]  . Hypothyroidism    pt takes Levothyroxine daily  . Myasthenia gravis, adult form ([place])   . Shortness of breath    Lung MD- Dr Darlin Coco  . Sleep apnea    do not use CPAP every night     Gen: WD/WN, NAD Head: [place]/AT, No temporalis wasting.  Ear/Nose/Throat: Hearing grossly intact, nares w/o erythema or drainage Eyes: PER, EOMI, sclera nonicteric.  Neck: Supple, no masses.  No JVD.  Pulmonary:  Good air movement, no use of accessory muscles.  Cardiac: RRR Vascular:  No bruits appreciated Vessel Right Left  Radial  palpable  palpable  Gastrointestinal: soft, non-distended. No guarding/no peritoneal signs.  Musculoskeletal: M/S 5/5 throughout.  No deformity or atrophy.  Neurologic: Pain and light touch intact in extremities.  Symmetrical.  Speech is fluent. Motor exam as listed above. Psychiatric: Judgment intact, Mood & affect appropriate for pt's clinical situation. Dermatologic: No Venous rashes. No Ulcers Noted.  No changes consistent with cellulitis. Lymph : 2+ pitting edema bilaterally.  Some stasis dermatitis present.  Social History   Socioeconomic History  .  Marital status: Married    Spouse name: Not on file  . Number of children: Not on file  . Years of education: Not on file  . Highest education level: Not on file  Occupational History  . Not on file  Social Needs  . Financial resource strain: Not on file  . Food insecurity:    Worry: Not on file    Inability: Not on file  . Transportation needs:    Medical: Not on file    Non-medical: Not on file  Tobacco Use  . Smoking status: Former Smoker    Packs/day: 1.00    Years: 20.00    Pack years: 20.00    Types: Cigarettes    Last attempt to quit: 09/12/2001    Years since quitting: 16.7  . Smokeless tobacco: Never Used  Substance and Sexual Activity  . Alcohol use: Yes    Comment: 6 beverages per week  . Drug use: No  . Sexual activity: Yes  Lifestyle  . Physical activity:    Days per week: Not on file    Minutes per session: Not on file  . Stress: Not on file  Relationships  . Social connections:    Talks on phone: Not on file    Gets together: Not on file    Attends religious service: Not on file    Active member of club or organization: Not on file    Attends meetings of clubs or organizations: Not on file    Relationship status: Not on file  . Intimate partner violence:    Fear of current or ex partner: Not on file    Emotionally abused: Not on file    Physically abused: Not on file    Forced sexual activity: Not on file  Other Topics Concern  . Not on file  Social History Narrative  . Not on file    Past Surgical History:  Procedure Laterality Date  . ANTERIOR CERVICAL DECOMP/DISCECTOMY FUSION  07/18/2011   Procedure: ANTERIOR CERVICAL DECOMPRESSION/DISCECTOMY FUSION 2 LEVELS;  Surgeon: Cooper Render Pool;  Location: [place] [facility];  Service: Neurosurgery;  Laterality: N/A;  cervical five-six, cervical six-seven anterior cervical discectomy and fusion  . BACK SURGERY     in [place]     2005 at [facility]  . HERNIA REPAIR Left     inguinal hernia repair in 1997  . LUMBAR LAMINECTOMY/DECOMPRESSION MICRODISCECTOMY Left 02/24/2014   Procedure: LUMBAR LAMINECTOMY/DECOMPRESSION MICRODISCECTOMY LUMBAR THREE-FOUR, FOUR-FIVE, LEFT FIVE-SACRAL ONE ;  Surgeon: Charlie Pitter, MD;  Location: [place] [facility];  Service: Neurosurgery;  Laterality: Left;  LUMBAR LAMINECTOMY/DECOMPRESSION MICRODISCECTOMY LUMBAR THREE-FOUR, FOUR-FIVE, LEFT FIVE-SACRAL ONE   . PROSTATECTOMY  8/14   [facility] Dr Mare Ferrari     Family History  Problem Relation Age of Onset  . Hypertension Mother   . Stroke  Mother   . Stroke Father     Allergies  Allergen Reactions  . Azithromycin Other (See Comments)    Avoid due to myasthenia gravis  . Codeine Nausea And Vomiting       Assessment & Plan:   1. Bilateral carotid artery stenosis Recommend:  Given the patient's asymptomatic subcritical stenosis no further invasive testing or surgery at this time.  Duplex ultrasound shows 60 to 79% stenosis on the right, 1 to 39% on the left.  Continue antiplatelet therapy as prescribed Continue management of CAD, HTN and Hyperlipidemia Healthy heart diet,  encouraged exercise at least 4 times per week Follow up in 6 months with duplex ultrasound and physical exam   2. Lower extremity edema  I have reviewed systemic causes for chronic edema such as liver, kidney and cardiac etiologies.  The patient denies problems with these organ systems, but I advised him to follow-up with his primary care provider to rule out any non-venous cause of swelling.     Patient will begin wearing graduated compression stockings class 1 (20-30 mmHg) on a daily basis a prescription was given. The patient will  beginning wearing the stockings first thing in the morning and removing them in the evening. The patient is instructed specifically not to sleep in the stockings.   In addition, behavioral modification will be initiated.  This will include frequent elevation, use of over the  counter pain medications and exercise such as walking.  Spoke with patient and wife and advised that if systemic causes for his lower extremity edema were ruled out, we will be happy to follow-up with him.  3. Pure hypercholesterolemia Continue statin as ordered and reviewed, no changes at this time   4. Essential hypertension, benign Continue antihypertensive medications as already ordered, these medications have been reviewed and there are no changes at this time.    Current Outpatient Medications on File Prior to Visit  Medication Sig Dispense Refill  . aspirin 81 MG tablet Take 81 mg by mouth every other day.     . azaTHIOprine (IMURAN) 50 MG tablet Take 150 mg by mouth daily.     . CHLOROTHIAZIDE PO Take by mouth.    . clobetasol cream (TEMOVATE) 0.05 % Apply topically.    . gabapentin (NEURONTIN) 300 MG capsule Take 300 mg by mouth 2 (two) times daily.    . hydrochlorothiazide (HYDRODIURIL) 25 MG tablet TAKE 1/2 TABLET (12.5 MG TOTAL) BY MOUTH ONCE DAILY  3  . HYDROcodone-acetaminophen (NORCO/VICODIN) 5-325 MG tablet Take 0.5 tablets by mouth 2 (two) times daily as needed for moderate pain.    Marland Kitchen latanoprost (XALATAN) 0.005 % ophthalmic solution Place 1 drop into both eyes daily.  99  . levothyroxine (SYNTHROID, LEVOTHROID) 88 MCG tablet Take 88 mcg by mouth daily.      Marland Kitchen losartan (COZAAR) 50 MG tablet Take 50 mg by mouth 2 (two) times daily.     . montelukast (SINGULAIR) 10 MG tablet TAKE 1 TABLET AT BEDTIME    . nystatin-triamcinolone (MYCOLOG II) cream Apply topically.    . pantoprazole (PROTONIX) 40 MG tablet Take 20 mg by mouth 2 (two) times daily.     . predniSONE (DELTASONE) 20 MG tablet Take 40 mg by mouth every other day.    . pyridostigmine (MESTINON) 60 MG tablet Take 60 mg by mouth daily.     . tamsulosin (FLOMAX) 0.4 MG CAPS capsule Take by mouth.    . tiotropium (SPIRIVA HANDIHALER) 18 MCG inhalation capsule  INHALE THE ENTIRE CONTENTS OF 1 CAPSULE ONCE A DAY USING  HANDIHALER DEVICE 30 capsule 5  . tolterodine (DETROL LA) 4 MG 24 hr capsule      No current facility-administered medications on file prior to visit.     There are no Patient Instructions on file for this visit. No follow-ups on file.   Kris Hartmann, NP

## 2018-06-11 ENCOUNTER — Other Ambulatory Visit: Payer: Self-pay | Admitting: Neurosurgery

## 2018-06-11 ENCOUNTER — Ambulatory Visit
Admission: RE | Admit: 2018-06-11 | Discharge: 2018-06-11 | Disposition: A | Discharge status: Home / Self Care | Payer: BLUE CROSS/BLUE SHIELD | Source: Ambulatory Visit | Attending: Neurosurgery | Admitting: Neurosurgery

## 2018-06-11 DIAGNOSIS — M48062 Spinal stenosis, lumbar region with neurogenic claudication: Secondary | ICD-10-CM

## 2018-06-11 MED ORDER — METHYLPREDNISOLONE ACETATE 40 MG/ML INJ SUSP (RADIOLOG
120.0000 mg | Freq: Once | INTRAMUSCULAR | Status: AC
  Administered 2018-06-11: 120 mg via EPIDURAL

## 2018-06-11 MED ORDER — IOPAMIDOL (ISOVUE-M 200) INJECTION 41%
1.0000 mL | Freq: Once | INTRAMUSCULAR | Status: AC
  Administered 2018-06-11: 1 mL via EPIDURAL

## 2018-07-20 ENCOUNTER — Other Ambulatory Visit: Payer: Self-pay

## 2018-07-20 MED ORDER — TIOTROPIUM BROMIDE MONOHYDRATE 18 MCG IN CAPS: ORAL_CAPSULE | RESPIRATORY_TRACT | 5 refills | Status: DC

## 2018-07-24 ENCOUNTER — Other Ambulatory Visit: Payer: Self-pay | Admitting: Neurosurgery

## 2018-07-24 DIAGNOSIS — M48062 Spinal stenosis, lumbar region with neurogenic claudication: Secondary | ICD-10-CM

## 2018-08-02 ENCOUNTER — Other Ambulatory Visit: Payer: Self-pay | Admitting: Neurosurgery

## 2018-08-02 ENCOUNTER — Ambulatory Visit
Admission: RE | Admit: 2018-08-02 | Discharge: 2018-08-02 | Disposition: A | Discharge status: Home / Self Care | Payer: Medicare Other | Source: Ambulatory Visit | Attending: Neurosurgery | Admitting: Neurosurgery

## 2018-08-02 DIAGNOSIS — M48062 Spinal stenosis, lumbar region with neurogenic claudication: Secondary | ICD-10-CM

## 2018-08-02 MED ORDER — METHYLPREDNISOLONE ACETATE 40 MG/ML INJ SUSP (RADIOLOG
120.0000 mg | Freq: Once | INTRAMUSCULAR | Status: AC
  Administered 2018-08-02: 120 mg via EPIDURAL

## 2018-08-02 MED ORDER — IOPAMIDOL (ISOVUE-M 200) INJECTION 41%
1.0000 mL | Freq: Once | INTRAMUSCULAR | Status: AC
  Administered 2018-08-02: 1 mL via EPIDURAL

## 2018-08-23 ENCOUNTER — Other Ambulatory Visit: Payer: Self-pay | Admitting: Neurosurgery

## 2018-08-23 DIAGNOSIS — M48062 Spinal stenosis, lumbar region with neurogenic claudication: Secondary | ICD-10-CM

## 2018-08-28 ENCOUNTER — Encounter

## 2018-08-28 ENCOUNTER — Encounter (INDEPENDENT_AMBULATORY_CARE_PROVIDER_SITE_OTHER): Payer: BLUE CROSS/BLUE SHIELD

## 2018-08-28 ENCOUNTER — Ambulatory Visit (INDEPENDENT_AMBULATORY_CARE_PROVIDER_SITE_OTHER): Payer: BLUE CROSS/BLUE SHIELD | Admitting: Vascular Surgery

## 2018-09-25 ENCOUNTER — Other Ambulatory Visit: Payer: Self-pay | Admitting: Neurosurgery

## 2018-09-25 ENCOUNTER — Ambulatory Visit
Admission: RE | Admit: 2018-09-25 | Discharge: 2018-09-25 | Disposition: A | Discharge status: Home / Self Care | Payer: No Typology Code available for payment source | Source: Ambulatory Visit | Attending: Neurosurgery | Admitting: Neurosurgery

## 2018-09-25 DIAGNOSIS — M48062 Spinal stenosis, lumbar region with neurogenic claudication: Secondary | ICD-10-CM

## 2018-09-25 MED ORDER — IOPAMIDOL (ISOVUE-M 200) INJECTION 41%
1.0000 mL | Freq: Once | INTRAMUSCULAR | Status: AC
  Administered 2018-09-25: 1 mL via EPIDURAL

## 2018-09-25 MED ORDER — METHYLPREDNISOLONE ACETATE 40 MG/ML INJ SUSP (RADIOLOG
120.0000 mg | Freq: Once | INTRAMUSCULAR | Status: AC
  Administered 2018-09-25: 120 mg via EPIDURAL

## 2018-11-05 ENCOUNTER — Other Ambulatory Visit: Payer: Self-pay | Admitting: Student

## 2018-11-05 DIAGNOSIS — M48062 Spinal stenosis, lumbar region with neurogenic claudication: Secondary | ICD-10-CM

## 2018-11-10 ENCOUNTER — Ambulatory Visit
Admission: RE | Admit: 2018-11-10 | Discharge: 2018-11-10 | Disposition: A | Discharge status: Home / Self Care | Payer: Medicare Other | Source: Ambulatory Visit | Attending: Student | Admitting: Student

## 2018-11-10 DIAGNOSIS — M48062 Spinal stenosis, lumbar region with neurogenic claudication: Secondary | ICD-10-CM

## 2018-11-23 ENCOUNTER — Encounter (INDEPENDENT_AMBULATORY_CARE_PROVIDER_SITE_OTHER): Payer: Self-pay | Admitting: Vascular Surgery

## 2018-11-23 ENCOUNTER — Ambulatory Visit (INDEPENDENT_AMBULATORY_CARE_PROVIDER_SITE_OTHER): Payer: No Typology Code available for payment source

## 2018-11-23 ENCOUNTER — Ambulatory Visit (INDEPENDENT_AMBULATORY_CARE_PROVIDER_SITE_OTHER): Payer: No Typology Code available for payment source | Admitting: Vascular Surgery

## 2018-11-23 ENCOUNTER — Other Ambulatory Visit: Payer: Self-pay

## 2018-11-23 VITALS — BP 102/63 | HR 83 | Resp 12 | Ht 70.0 in | Wt 245.0 lb

## 2018-11-23 DIAGNOSIS — I10 Essential (primary) hypertension: Secondary | ICD-10-CM | POA: Diagnosis not present

## 2018-11-23 DIAGNOSIS — I6523 Occlusion and stenosis of bilateral carotid arteries: Secondary | ICD-10-CM

## 2018-11-23 DIAGNOSIS — Z87891 Personal history of nicotine dependence: Secondary | ICD-10-CM | POA: Diagnosis not present

## 2018-11-23 DIAGNOSIS — J439 Emphysema, unspecified: Secondary | ICD-10-CM

## 2018-11-23 DIAGNOSIS — Z7982 Long term (current) use of aspirin: Secondary | ICD-10-CM

## 2018-11-23 DIAGNOSIS — Z79899 Other long term (current) drug therapy: Secondary | ICD-10-CM

## 2018-11-23 NOTE — Assessment & Plan Note (Signed)
Carotid duplex today reveals stable 60 to 79% right ICA stenosis and 40 to 59% left ICA stenosis. No role for intervention at this time.  Continue current medical regimen.  Recheck in 6 months.

## 2018-11-23 NOTE — Progress Notes (Signed)
MRN : [id number]  Francisco [name] is a 73 y.o. (04/08/1946) male who presents with chief complaint of  Chief Complaint  Patient presents with   Follow-up  .  History of Present Illness: patient returns in follow up of carotid disease.  He is doing well other than his back issues.  No focal neurologic issues.  Specifically, the patient denies amaurosis fugax, speech or swallowing difficulties, or arm or leg weakness or numbness.  Carotid duplex today reveals stable 60 to 79% right ICA stenosis and 40 to 59% left ICA stenosis.  Current Outpatient Medications  Medication Sig Dispense Refill   aspirin 81 MG tablet Take 81 mg by mouth every other day.      azaTHIOprine (IMURAN) 50 MG tablet Take 150 mg by mouth daily.      CHLOROTHIAZIDE PO Take by mouth.     gabapentin (NEURONTIN) 300 MG capsule Take 300 mg by mouth 2 (two) times daily.     HYDROcodone-acetaminophen (NORCO/VICODIN) 5-325 MG tablet Take 0.5 tablets by mouth 2 (two) times daily as needed for moderate pain.     latanoprost (XALATAN) 0.005 % ophthalmic solution Place 1 drop into both eyes daily.  99   levothyroxine (SYNTHROID, LEVOTHROID) 88 MCG tablet Take 88 mcg by mouth daily.       losartan (COZAAR) 50 MG tablet Take 50 mg by mouth 2 (two) times daily.      pantoprazole (PROTONIX) 40 MG tablet Take 20 mg by mouth 2 (two) times daily.      predniSONE (DELTASONE) 20 MG tablet Take 40 mg by mouth every other day.     pyridostigmine (MESTINON) 60 MG tablet Take 60 mg by mouth daily.      tamsulosin (FLOMAX) 0.4 MG CAPS capsule Take by mouth.     tiotropium (SPIRIVA HANDIHALER) 18 MCG inhalation capsule INHALE THE ENTIRE CONTENTS OF 1 CAPSULE ONCE A DAY USING HANDIHALER DEVICE 30 capsule 5   hydrochlorothiazide (HYDRODIURIL) 25 MG tablet TAKE 1/2 TABLET (12.5 MG TOTAL) BY MOUTH ONCE DAILY  3   montelukast (SINGULAIR) 10 MG tablet TAKE 1 TABLET AT BEDTIME     nystatin-triamcinolone (MYCOLOG II) cream Apply  topically.     tolterodine (DETROL LA) 4 MG 24 hr capsule      No current facility-administered medications for this visit.     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)    Shortness of breath    Lung MD- Dr Darlin Coco   Sleep apnea    do not use CPAP every night    Past Surgical History:  Procedure Laterality Date   ANTERIOR CERVICAL DECOMP/DISCECTOMY FUSION  07/18/2011   Procedure: ANTERIOR CERVICAL DECOMPRESSION/DISCECTOMY FUSION 2 LEVELS;  Surgeon: Cooper Render Pool;  Location: [facility];  Service: Neurosurgery;  Laterality: N/A;  cervical five-six, cervical six-seven anterior cervical discectomy and fusion   BACK SURGERY     in [place]     2005 at [place] Left    inguinal hernia repair in 1997  LUMBAR LAMINECTOMY/DECOMPRESSION MICRODISCECTOMY Left 02/24/2014   Procedure: LUMBAR LAMINECTOMY/DECOMPRESSION MICRODISCECTOMY LUMBAR THREE-FOUR, FOUR-FIVE, LEFT FIVE-SACRAL ONE ;  Surgeon: Charlie Pitter, MD;  Location: [facility];  Service: Neurosurgery;  Laterality: Left;  LUMBAR LAMINECTOMY/DECOMPRESSION MICRODISCECTOMY LUMBAR THREE-FOUR, FOUR-FIVE, LEFT FIVE-SACRAL ONE    PROSTATECTOMY  8/14   [facility] Dr Mare Ferrari    Social History  Substance Use Topics   Smoking status: Former Smoker    Packs/day: 1.00    Years: 20.00    Types: Cigarettes    Quit date: 09/12/2001   Smokeless tobacco: Never Used   Alcohol use Yes      Comment: 6 beverages per week     Family History      Family History  Problem  Relation Age of Onset   Hypertension Mother    Stroke Mother    Stroke Father         Allergies  Allergen Reactions   Codeine Nausea And Vomiting     REVIEW OF SYSTEMS(Negative unless checked)  Constitutional: [] ?Weight loss[] ?Fever[] ?Chills Cardiac:[] ?Chest pain[] ?Chest pressure[] ?Palpitations [] ?Shortness of breath when laying flat [] ?Shortness of breath at rest [] ?Shortness of breath with exertion. Vascular: [] ?Pain in legs with walking[] ?Pain in legsat rest[] ?Pain in legs when laying flat [] ?Claudication [] ?Pain in feet when walking [] ?Pain in feet at rest [] ?Pain in feet when laying flat [] ?History of DVT [] ?Phlebitis [] ?Swelling in legs [] ?Varicose veins [] ?Non-healing ulcers Pulmonary: [] ?Uses home oxygen [] ?Productive cough[] ?Hemoptysis [] ?Wheeze [x] ?COPD [] ?Asthma Neurologic: [] ?Dizziness [] ?Blackouts [] ?Seizures [] ?History of stroke [] ?History of TIA[] ?Aphasia [] ?Temporary blindness[] ?Dysphagia [] ?Weaknessor numbness in arms [] ?Weakness or numbnessin legs Musculoskeletal: [x] ?Arthritis [] ?Joint swelling [x] ?Joint pain [x] ?Low back pain Hematologic:[] ?Easy bruising[] ?Easy bleeding [] ?Hypercoagulable state [x] ?Anemic [] ?Hepatitis Gastrointestinal:[] ?Blood in stool[] ?Vomiting blood[] ?Gastroesophageal reflux/heartburn[] ?Difficulty swallowing. Genitourinary: [] ?Chronic kidney disease [] ?Difficulturination [] ?Frequenturination [] ?Burning with urination[] ?Blood in urine Skin: [] ?Rashes [] ?Ulcers [] ?Wounds Psychological: [] ?History of anxiety[] ?History of major depression.    Physical Examination  Vitals:   11/23/18 1115  BP: 102/63  Pulse: 83  Resp: 12  Weight: 245 lb (111.1 kg)  Height: 5\' 10"  (1.778 m)   Body mass index is 35.15 kg/m. Gen:  WD/WN, NAD Head: [place]/AT, No temporalis wasting. Ear/Nose/Throat: Hearing grossly intact, nares  w/o erythema or drainage, trachea midline Eyes: Conjunctiva clear. Sclera non-icteric Neck: Supple.  Bilateral carotid bruits Pulmonary:  Good air movement, equal and clear to auscultation bilaterally.  Cardiac: RRR, No JVD Vascular:  Vessel Right Left  Radial Palpable Palpable       Musculoskeletal: M/S 5/5 throughout.  No deformity or atrophy.  Trace lower extremity edema. Neurologic: CN 2-12 intact. Sensation grossly intact in extremities.  Symmetrical.  Speech is fluent. Motor exam as listed above. Psychiatric: Judgment intact, Mood & affect appropriate for pt's clinical situation. Dermatologic: No rashes or ulcers noted.  No cellulitis or open wounds.      CBC Lab Results  Component Value Date   WBC 9.3 03/03/2014   HGB 9.2 (L) 03/03/2014   HCT 26.5 (L) 03/04/2014   MCV 96 03/03/2014   PLT 360 03/03/2014    BMET    Component Value Date/Time   NA 141 03/04/2014 0450   K 3.9 03/04/2014 0450   CL 104 03/04/2014 0450   CO2 28 03/04/2014 0450   GLUCOSE 100 (H) 03/04/2014 0450   BUN 25 (H) 03/04/2014 0450   CREATININE 0.99 03/04/2014 0450   CALCIUM 9.0 03/04/2014 0450   GFRNONAA >60 03/04/2014 0450   GFRAA >60 03/04/2014 0450   CrCl cannot be calculated (Patient's most recent lab result is older than the maximum  21 days allowed.).  COAG No results found for: INR, PROTIME  Radiology Mr Lumbar Spine Wo Contrast  Result Date: 11/11/2018 CLINICAL DATA:  Chronic low back pain with left hip, buttock, and leg pain as well as weakness and numbness. Prior lumbar surgeries. EXAM: MRI LUMBAR SPINE WITHOUT CONTRAST TECHNIQUE: Multiplanar, multisequence MR imaging of the lumbar spine was performed. No intravenous contrast was administered. COMPARISON:  12/27/2015 FINDINGS: Segmentation:  Standard. Alignment:  Grade 1 retrolisthesis of L5 on S1, unchanged. Vertebrae: No fracture, suspicious osseous lesion, or significant marrow edema. Chronic degenerative endplate changes at V7-Q4.  Conus medullaris and cauda equina: Conus extends to the L2 level. Conus and cauda equina appear normal. Paraspinal and other soft tissues: Postoperative changes in the posterior lumbar soft tissues. No fluid collection. Bilateral renal cortical thinning. Disc levels: Disc desiccation throughout the lumbar spine. Unchanged disc space narrowing throughout the lumbar spine, severe at L5-S1. T12-L1: Mild disc bulging without stenosis, unchanged. L1-2: Mild disc bulging, mild facet and ligamentum flavum hypertrophy, and prominent dorsal epidural fat result in mild spinal stenosis without neural foraminal stenosis, unchanged. L2-3: Mild disc bulging, moderate facet and ligamentum flavum hypertrophy, and prominent dorsal epidural fat result in moderate to severe spinal stenosis without neural foraminal stenosis, unchanged. L3-4: Prior posterior decompression. Circumferential disc bulging, a right subarticular to foraminal disc protrusion, and mild-to-moderate facet hypertrophy result in moderate right and mild left lateral recess stenosis, mild spinal stenosis, and moderate bilateral neural foraminal stenosis. Neural foraminal stenosis has slightly progressed. L4-5: Prior posterior decompression. Circumferential disc bulging, a right paracentral disc protrusion, a left foraminal disc extrusion, and moderate facet hypertrophy result in mild-to-moderate right greater than left lateral recess stenosis and severe bilateral neural foraminal stenosis with potential bilateral L4 nerve root impingement, unchanged. L5-S1: Prior posterior decompression. Circumferential disc bulging, severe disc space height loss, endplate spurring, and moderate facet hypertrophy result in severe bilateral neural foraminal stenosis with potential bilateral L5 nerve root impingement, unchanged. Residual narrowing and postoperative distortion of the lateral recesses is unchanged. No generalized spinal stenosis. IMPRESSION: 1. Slight progression of  moderate bilateral neural foraminal stenosis at L3-4. 2. Unchanged severe bilateral neural foraminal stenosis at L4-5 and L5-S1. 3. Unchanged moderate to severe spinal stenosis at L2-3 and mild spinal stenosis at L1-2 with prominent epidural fat contributing. Electronically Signed   By: Logan Bores M.D.   On: 11/11/2018 07:03   Vas US Carotid  Result Date: 11/23/2018 Carotid Arterial Duplex Study Indications:       Carotid artery disease and Carotid artery stenosis. Comparison Study:  05/25/2018 Performing Technologist: Charlane Ferretti RT (R)(VS)  Examination Guidelines: A complete evaluation includes B-mode imaging, spectral Doppler, color Doppler, and power Doppler as needed of all accessible portions of each vessel. Bilateral testing is considered an integral part of a complete examination. Limited examinations for reoccurring indications may be performed as noted.  Right Carotid Findings: +----------+--------+--------+--------+--------+--------+             PSV cm/s EDV cm/s Stenosis Describe Comments  +----------+--------+--------+--------+--------+--------+  CCA Prox   118      24                                   +----------+--------+--------+--------+--------+--------+  CCA Mid    84       20                                   +----------+--------+--------+--------+--------+--------+  CCA Distal 161      44                calcific           +----------+--------+--------+--------+--------+--------+  ICA Prox   241      20                calcific           +----------+--------+--------+--------+--------+--------+  ICA Mid    310      33                                   +----------+--------+--------+--------+--------+--------+  ICA Distal 86       35                                   +----------+--------+--------+--------+--------+--------+  ECA        439      29                                   +----------+--------+--------+--------+--------+--------+  +----------+--------+-------+--------+-------------------+             PSV cm/s EDV cms Describe Arm Pressure (mmHG)  +----------+--------+-------+--------+-------------------+  Subclavian 171                                            +----------+--------+-------+--------+-------------------+ +---------+--------+--+--------+--+  Vertebral PSV cm/s 68 EDV cm/s 24  +---------+--------+--+--------+--+  Left Carotid Findings: +----------+--------+--------+--------+--------+------------------+             PSV cm/s EDV cm/s Stenosis Describe Comments            +----------+--------+--------+--------+--------+------------------+  CCA Prox   120      27                                             +----------+--------+--------+--------+--------+------------------+  CCA Mid    78       20                         intimal thickening  +----------+--------+--------+--------+--------+------------------+  CCA Distal 116      25                calcific                     +----------+--------+--------+--------+--------+------------------+  ICA Prox   207      37                calcific                     +----------+--------+--------+--------+--------+------------------+  ICA Mid    146      36                                             +----------+--------+--------+--------+--------+------------------+  ICA Distal 106  46                                             +----------+--------+--------+--------+--------+------------------+  ECA        349      27                                             +----------+--------+--------+--------+--------+------------------+ +----------+--------+--------+--------+-------------------+  Subclavian PSV cm/s EDV cm/s Describe Arm Pressure (mmHG)  +----------+--------+--------+--------+-------------------+             168                                             +----------+--------+--------+--------+-------------------+ +---------+--------+--+--------+--+  Vertebral PSV cm/s 72 EDV  cm/s 21  +---------+--------+--+--------+--+ Summary: Right Carotid: Velocities in the right ICA are consistent with a 60-79%                stenosis. The ECA appears >50% stenosed. Left Carotid: Velocities in the left ICA are consistent with a 40-59% stenosis.               The ECA appears >50% stenosed.                Bilateral ICA and ECA velocities have increased since previous               exam on 05/25/2018. Vertebrals:  Bilateral vertebral arteries demonstrate antegrade flow. Subclavians: Normal flow hemodynamics were seen in bilateral subclavian              arteries. *See table(s) above for measurements and observations.  Electronically signed by Leotis Pain MD on 11/23/2018 at 11:56:13 AM.    Final       Assessment/Plan COPD (chronic obstructive pulmonary disease) Followed by Pulmonology  Essential hypertension, benign blood pressure control important in reducing the progression of atherosclerotic disease. On appropriate oral medications.  Bilateral carotid artery disease (HCC) Carotid duplex today reveals stable 60 to 79% right ICA stenosis and 40 to 59% left ICA stenosis. No role for intervention at this time.  Continue current medical regimen.  Recheck in 6 months.     Leotis Pain, MD  11/23/2018 12:04 PM    This note was created with Dragon medical transcription system.  Any errors from dictation are purely unintentional

## 2018-12-28 ENCOUNTER — Encounter: Payer: Self-pay | Admitting: Internal Medicine

## 2018-12-28 ENCOUNTER — Ambulatory Visit (INDEPENDENT_AMBULATORY_CARE_PROVIDER_SITE_OTHER): Payer: No Typology Code available for payment source | Admitting: Internal Medicine

## 2018-12-28 DIAGNOSIS — J9611 Chronic respiratory failure with hypoxia: Secondary | ICD-10-CM | POA: Diagnosis not present

## 2018-12-28 DIAGNOSIS — J449 Chronic obstructive pulmonary disease, unspecified: Secondary | ICD-10-CM

## 2018-12-28 MED ORDER — TIOTROPIUM BROMIDE MONOHYDRATE 1.25 MCG/ACT IN AERS: 2.0000 | INHALATION_SPRAY | Freq: Every day | RESPIRATORY_TRACT | 5 refills | Status: DC

## 2018-12-28 NOTE — Progress Notes (Signed)
[facility] Consultation      Date: 12/28/2018,   MRN# [id number] ADRIANE [name] 07/11/1946    Admission                  Current  Francisco [name] is a 73 y.o. old male seen in consultation for COPD   Previous STUDIES  Last PFT 12/2013 Ratio 60% FEV1 54% DLCO 60% FEF25/75 17%  2016 Ratio 81% FEV1 56% FVC 51% DLCO 51% Fef 25/75 69%   6MWT WNL no evidence of drop in oxygen levels 6 months ago       TELEPHONE VISIT    In the setting of the current Covid19 crisis, you are scheduled for a  visit with me on 12/28/2018  Just as we do with many in-office visits, in order for you to participate in this visit, we must obtain consent.   I can obtain your verbal consent now.  PATIENT AGREES AND CONFIRMS -YES This Visit has Audio and Visual Capabilities for optimal patient care experience   Evaluation Performed:  Follow-up visit  This visit type was conducted due to national recommendations for restrictions regarding the COVID-19 Pandemic (e.g. social distancing).  This format is felt to be most appropriate for this patient at this time.  All issues noted in this document were discussed and addressed.     Virtual Visit via Telephone Note     I connected with patient on 12/28/2018  by telephone and verified that I am speaking with the correct person using two identifiers.   I discussed the limitations, risks, security and privacy concerns of performing an evaluation and management service by telephone and the availability of in person appointments. I also discussed with the patient that there may be a patient responsible charge related to this service. The patient expressed understanding and agreed to proceed.   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  aspirin 81 MG tablet, Take 81 mg by mouth every other day. , Disp: , Rfl:  .  azaTHIOprine (IMURAN) 50 MG tablet, Take 150 mg by mouth daily. , Disp: , Rfl:  .  CHLOROTHIAZIDE PO, Take by mouth., Disp: , Rfl:  .  gabapentin (NEURONTIN) 300 MG capsule, Take 300 mg by mouth 2 (two) times daily., Disp: , Rfl:  .  hydrochlorothiazide (HYDRODIURIL) 25 MG tablet, TAKE 1/2 TABLET (12.5 MG TOTAL) BY MOUTH ONCE DAILY, Disp: , Rfl: 3 .  HYDROcodone-acetaminophen (NORCO/VICODIN) 5-325 MG tablet, Take  0.5 tablets by mouth 2 (two) times daily as needed for moderate pain., Disp: , Rfl:  .  latanoprost (XALATAN) 0.005 % ophthalmic solution, Place 1 drop into both eyes daily., Disp: , Rfl: 99 .  levothyroxine (SYNTHROID, LEVOTHROID) 88 MCG tablet, Take 88 mcg by mouth daily.  , Disp: , Rfl:  .  losartan (COZAAR) 50 MG tablet, Take 50 mg by mouth 2 (two) times daily. , Disp: , Rfl:  .  montelukast (SINGULAIR) 10 MG tablet, TAKE 1 TABLET AT BEDTIME, Disp: , Rfl:  .  nystatin-triamcinolone (MYCOLOG II) cream, Apply topically., Disp: , Rfl:  .  pantoprazole (PROTONIX) 40 MG tablet, Take 20 mg by mouth 2 (two) times  daily. , Disp: , Rfl:  .  predniSONE (DELTASONE) 20 MG tablet, Take 40 mg by mouth every other day., Disp: , Rfl:  .  pyridostigmine (MESTINON) 60 MG tablet, Take 60 mg by mouth daily. , Disp: , Rfl:  .  tamsulosin (FLOMAX) 0.4 MG CAPS capsule, Take by mouth., Disp: , Rfl:  .  tiotropium (SPIRIVA HANDIHALER) 18 MCG inhalation capsule, INHALE THE ENTIRE CONTENTS OF 1 CAPSULE ONCE A DAY USING HANDIHALER DEVICE, Disp: 30 capsule, Rfl: 5 .  tolterodine (DETROL LA) 4 MG 24 hr capsule, , Disp: , Rfl:     ALLERGIES   Azithromycin and Codeine     REVIEW OF SYSTEMS   Review of Systems  Constitutional: Negative for chills, diaphoresis, fever, malaise/fatigue and weight loss.  HENT: Negative for congestion.   Respiratory: Positive for shortness of breath. Negative for cough, hemoptysis, sputum production and wheezing.   Cardiovascular: Negative for chest pain.  Gastrointestinal: Negative for abdominal pain, heartburn, nausea and vomiting.  Skin: Negative for rash.  Neurological: Negative for weakness.  All other systems reviewed and are negative.  There were no vitals taken for this visit.        ASSESSMENT/PLAN   73 year old white male with mild COPD stage a with history of myasthenia gravis PFTs consistent with restrictive lung disease with normal 6-minute walk with chronic hypoxic respiratory failure in the setting of morbid obesity and deconditioned state  At this time patient states that his Spiriva HandiHaler is not working and is not getting any benefit from this  I have advised that maybe switching to [place] may make a difference and will order that as a plan of care    #1 COPD with restrictive lung disease Mild Gold stage A Albuterol as needed-I have advised to use albuterol 10 minutes prior to planned exertional activity Stop Spiriva HandiHaler and will switch to Spiriva Respimat  Patient has failed and tried Spiriva HandiHaler  Will obtain pulmonary  function testing at next visit     #2 myasthenia gravis In remission at this time On oxygen therapy at nighttime Follow-[facility] with [facility] Patient is on chronic immunosuppressive therapy-prednisone Mestinon and azathioprine   #3 untreated OSA Patient refuses CPAP therapy at this time   Obesity -recommend significant weight loss -recommend changing diet  Deconditioned state -Recommend increased daily activity and exercise    Total time spent 24 minutes  COVID-19 Education: The signs and symptoms of COVID-19 were discussed with the patient and how to seek care for testing (follow up with PCP or arrange E-visit).  The importance of social distancing was discussed today.  Follow-up 6 months  Patient satisfied with Plan of action and management. All questions answered  Corrin Parker, M.D.  [facility]  Medical Director  [place] [facility]

## 2018-12-28 NOTE — Patient Instructions (Addendum)
Continue oxygen as prescribed  Stop Spiriva HandiHaler and will switch to Spiriva Respimat  Please use albuterol 10 minutes prior to planned exertional activity

## 2019-01-23 DIAGNOSIS — E538 Deficiency of other specified B group vitamins: Secondary | ICD-10-CM | POA: Insufficient documentation

## 2019-02-20 ENCOUNTER — Telehealth: Payer: Self-pay | Admitting: Internal Medicine

## 2019-02-20 DIAGNOSIS — G4734 Idiopathic sleep related nonobstructive alveolar hypoventilation: Secondary | ICD-10-CM

## 2019-02-20 NOTE — Telephone Encounter (Signed)
Pt called, stating that he received a letter from [facility] stating that he needs to make an appt. in order to keep his oxygen concentrator. Wanted to know if something can be sent over to [place] letting them know he just had a visit 12/28/2018 and that he still uses/needs oxygen.

## 2019-02-20 NOTE — Telephone Encounter (Signed)
Spoke to [place] at [facility], patient needs to be requalified for ONO and needs F2F. Will forward to Dr. Mortimer Fries to see what order he would like these in.

## 2019-02-20 NOTE — Telephone Encounter (Signed)
Please order ONO 

## 2019-02-21 NOTE — Telephone Encounter (Signed)
lmtcb x1 pt 

## 2019-02-25 NOTE — Telephone Encounter (Signed)
LM on VM that we will put in order for ONO with [facility] to re qualify for nighttime oxygen, then we will have to do face-to-face visit. Will let him know when we get results to set up f/u apt.

## 2019-02-27 ENCOUNTER — Encounter: Payer: Self-pay | Admitting: Internal Medicine

## 2019-03-01 ENCOUNTER — Telehealth: Payer: Self-pay

## 2019-03-01 DIAGNOSIS — G4734 Idiopathic sleep related nonobstructive alveolar hypoventilation: Secondary | ICD-10-CM

## 2019-03-01 NOTE — Telephone Encounter (Signed)
LM on VM for patient to call and schedule f/u face to face visit for nighttime oxygen. ONO shows that he does still need 1L [place] qHS.

## 2019-03-02 ENCOUNTER — Other Ambulatory Visit: Payer: Self-pay | Admitting: Internal Medicine

## 2019-03-04 NOTE — Telephone Encounter (Signed)
Lm x2 for pt.  

## 2019-03-05 ENCOUNTER — Other Ambulatory Visit: Payer: Self-pay | Admitting: Internal Medicine

## 2019-03-05 DIAGNOSIS — J449 Chronic obstructive pulmonary disease, unspecified: Secondary | ICD-10-CM

## 2019-03-05 NOTE — Telephone Encounter (Signed)
Left message x 3 for pt.  Letter has been mailed to address on file.

## 2019-03-05 NOTE — Telephone Encounter (Signed)
ATC pt- unable to leave voicemail, due to mailbox being full.  Will call back

## 2019-03-06 MED ORDER — SPIRIVA RESPIMAT 1.25 MCG/ACT IN AERS: 2.0000 | INHALATION_SPRAY | Freq: Every day | RESPIRATORY_TRACT | 2 refills | Status: DC

## 2019-03-06 NOTE — Telephone Encounter (Signed)
Pt is aware of ONO results. (please see 03/01/2019 phone note). Pt has been scheduled for face to face on 03/18/2019.  Pt also requested refill on Spiriva 1.25 . Rx has been sent to preferred pharmacy. Nothing further is needed at this time.

## 2019-03-06 NOTE — Telephone Encounter (Signed)
Left message x2 for pt. 

## 2019-03-18 ENCOUNTER — Other Ambulatory Visit: Payer: Self-pay

## 2019-03-18 ENCOUNTER — Ambulatory Visit (INDEPENDENT_AMBULATORY_CARE_PROVIDER_SITE_OTHER): Payer: No Typology Code available for payment source | Admitting: Internal Medicine

## 2019-03-18 ENCOUNTER — Encounter: Payer: Self-pay | Admitting: Internal Medicine

## 2019-03-18 VITALS — BP 130/72 | HR 76 | Temp 97.4°F | Ht 70.0 in | Wt 254.4 lb

## 2019-03-18 DIAGNOSIS — J449 Chronic obstructive pulmonary disease, unspecified: Secondary | ICD-10-CM

## 2019-03-18 DIAGNOSIS — G4730 Sleep apnea, unspecified: Secondary | ICD-10-CM | POA: Diagnosis not present

## 2019-03-18 DIAGNOSIS — I6523 Occlusion and stenosis of bilateral carotid arteries: Secondary | ICD-10-CM

## 2019-03-18 NOTE — Progress Notes (Signed)
[facility] Consultation      Date: 03/18/2019,   MRN# [id number] Francisco [name] November 25, 1945    Admission                  Current  Francisco [name] is a 73 y.o. old male seen in consultation for COPD   Previous STUDIES  Last PFT 12/2013 Ratio 60% FEV1 54% DLCO 60% FEF25/75 17%  2016 Ratio 81% FEV1 56% FVC 51% DLCO 51% Fef 25/75 69%   6MWT WNL no evidence of drop in oxygen levels 6 months ago     CHIEF COMPLAINT:   Follow-up COPD   HISTORY OF PRESENT ILLNESS  Patient has chronic dyspnea on exertion and shortness of breath Patient has gained weight over the last 6 months This could be contributing to his increased shortness of breath Spiriva Respimat seems to be helping at this time  No evidence of COPD exacerbation No major complaints Works as a Engineer, maintenance tobacco approximately 17 years ago However he smoked half pack a day for 40 years Lung cancer screening referral made  No signs of infection at this time No signs of respiratory distress at this time  Previously diagnosed with sleep apnea Patient uses oxygen at nighttime Use and benefits from oxygen therapy    no signs of infection at this time ses albuterol infrequently   Current Medication:  Current Outpatient Medications:  .  aspirin 81 MG tablet, Take 81 mg by mouth every other day. , Disp: , Rfl:  .  azaTHIOprine (IMURAN) 50 MG tablet, Take 150 mg by mouth daily. , Disp: , Rfl:  .  CHLOROTHIAZIDE PO, Take by mouth., Disp: , Rfl:  .  gabapentin (NEURONTIN) 300 MG capsule, Take 300 mg by mouth 2 (two) times daily., Disp: , Rfl:  .  hydrochlorothiazide (HYDRODIURIL) 25 MG tablet, TAKE 1/2 TABLET (12.5 MG TOTAL) BY MOUTH ONCE DAILY, Disp: , Rfl: 3 .  HYDROcodone-acetaminophen (NORCO/VICODIN) 5-325 MG tablet, Take 0.5 tablets by mouth 2 (two) times daily as needed for moderate pain., Disp: , Rfl:  .  latanoprost (XALATAN) 0.005 % ophthalmic solution, Place 1 drop into  both eyes daily., Disp: , Rfl: 99 .  levothyroxine (SYNTHROID, LEVOTHROID) 88 MCG tablet, Take 88 mcg by mouth daily.  , Disp: , Rfl:  .  losartan (COZAAR) 50 MG tablet, Take 50 mg by mouth 2 (two) times daily. , Disp: , Rfl:  .  montelukast (SINGULAIR) 10 MG tablet, TAKE 1 TABLET AT BEDTIME, Disp: , Rfl:  .  nystatin-triamcinolone (MYCOLOG II) cream, Apply topically., Disp: , Rfl:  .  pantoprazole (PROTONIX) 40 MG tablet, Take 20 mg by mouth 2 (two) times daily. , Disp: , Rfl:  .  predniSONE (DELTASONE) 20 MG tablet, Take 40 mg by mouth every other day., Disp: , Rfl:  .  pyridostigmine (MESTINON) 60 MG tablet, Take 60 mg by mouth daily. , Disp: , Rfl:  .  tamsulosin (FLOMAX) 0.4 MG CAPS capsule, Take by mouth., Disp: , Rfl:  .  tiotropium (SPIRIVA HANDIHALER) 18 MCG inhalation capsule, INHALE THE ENTIRE CONTENTS OF 1 CAPSULE ONCE A DAY USING HANDIHALER DEVICE, Disp: 30 capsule, Rfl: 5 .  Tiotropium Bromide Monohydrate (SPIRIVA RESPIMAT) 1.25 MCG/ACT AERS, Inhale 2 puffs into the lungs daily., Disp: 4 g, Rfl: 2 .  tolterodine (DETROL LA) 4 MG 24 hr capsule, , Disp: , Rfl:     ALLERGIES   Azithromycin and Codeine     REVIEW OF SYSTEMS  Review of Systems  Constitutional: Negative for chills, diaphoresis, fever, malaise/fatigue and weight loss.  HENT: Negative for congestion.   Respiratory: Positive for shortness of breath. Negative for cough, hemoptysis, sputum production and wheezing.   Cardiovascular: Negative for chest pain.  Gastrointestinal: Negative for abdominal pain, heartburn, nausea and vomiting.  Skin: Negative for rash.  Neurological: Negative for weakness.  All other systems reviewed and are negative.  BP 130/72 (BP Location: Left Arm, Cuff Size: Normal)   Pulse 76   Temp (!) 97.4 F (36.3 C) (Temporal)   Ht 5\' 10"  (1.778 m)   Wt 254 lb 6.4 oz (115.4 kg)   SpO2 97%   BMI 36.50 kg/m        ASSESSMENT/PLAN   73 year old pleasant white male seen today for  follow-up COPD Patient with a history of myasthenia gravis in the setting of morbid obesity with restrictive lung disease in the setting of chronic hypoxic respiratory failure with deconditioned state  COPD with restrictive lung disease Gold stage a Continue albuterol as needed Continue Spiriva Respimat as prescribed  Myasthenia gravis On oxygen therapy at nighttime Follow-up with [facility] On chronic immunosuppressive therapy prednisone Mestinon and azathioprine   Untreated sleep apnea Patient refuses CPAP therapy at this time Continue oxygen therapy as prescribed  Hypoxic respiratory failure chronic From COPD Continue oxygen therapy as prescribed Patient uses and benefits from therapy and needs this for survival  Obesity -recommend significant weight loss -recommend changing diet  Deconditioned state -Recommend increased daily activity and exercise     COVID-19 EDUCATION: The signs and symptoms of COVID-19 were discussed with the patient and how to seek care for testing.  The importance of social distancing was discussed today. Hand Washing Techniques and avoid touching face was advised.  MEDICATION ADJUSTMENTS/LABS AND TESTS ORDERED: Lung cancer screening referral  CURRENT MEDICATIONS REVIEWED AT LENGTH WITH PATIENT TODAY   Patient  satisfied with Plan of action and management. All questions answered Follow up in 6 months   Janeka Libman Patricia Pesa, M.D.  [facility]  Medical Director [place] Director [facility] Cardio-Pulmonary Department

## 2019-03-18 NOTE — Patient Instructions (Addendum)
Continue inhalers as prescribed  Continue oxygen as prescribed  Lung cancer screening referral pending

## 2019-03-19 ENCOUNTER — Telehealth: Payer: Self-pay | Admitting: *Deleted

## 2019-03-19 NOTE — Telephone Encounter (Signed)
Received referral for lung screening. Contacted patient and reviewed smoking history of 20 pack year with quit date 17 years ago. Patient is made aware and verbalizes understanding that he is not eligible for lung cancer screening due to < 30 pack year history and > 15 years since quit.

## 2019-04-29 DIAGNOSIS — N183 Chronic kidney disease, stage 3 unspecified: Secondary | ICD-10-CM | POA: Insufficient documentation

## 2019-05-29 ENCOUNTER — Other Ambulatory Visit: Payer: Self-pay

## 2019-05-29 DIAGNOSIS — J449 Chronic obstructive pulmonary disease, unspecified: Secondary | ICD-10-CM

## 2019-05-29 MED ORDER — SPIRIVA RESPIMAT 1.25 MCG/ACT IN AERS: 2.0000 | INHALATION_SPRAY | Freq: Every day | RESPIRATORY_TRACT | 2 refills | Status: DC

## 2019-06-06 ENCOUNTER — Encounter (INDEPENDENT_AMBULATORY_CARE_PROVIDER_SITE_OTHER): Payer: Self-pay | Admitting: Vascular Surgery

## 2019-06-06 ENCOUNTER — Ambulatory Visit (INDEPENDENT_AMBULATORY_CARE_PROVIDER_SITE_OTHER): Payer: No Typology Code available for payment source

## 2019-06-06 ENCOUNTER — Encounter (INDEPENDENT_AMBULATORY_CARE_PROVIDER_SITE_OTHER): Payer: Self-pay

## 2019-06-06 ENCOUNTER — Ambulatory Visit (INDEPENDENT_AMBULATORY_CARE_PROVIDER_SITE_OTHER): Payer: No Typology Code available for payment source | Admitting: Vascular Surgery

## 2019-06-06 ENCOUNTER — Other Ambulatory Visit: Payer: Self-pay

## 2019-06-06 VITALS — BP 95/57 | HR 87 | Resp 16 | Ht 70.0 in | Wt 255.0 lb

## 2019-06-06 DIAGNOSIS — I10 Essential (primary) hypertension: Secondary | ICD-10-CM | POA: Diagnosis not present

## 2019-06-06 DIAGNOSIS — I6523 Occlusion and stenosis of bilateral carotid arteries: Secondary | ICD-10-CM

## 2019-06-06 DIAGNOSIS — I739 Peripheral vascular disease, unspecified: Secondary | ICD-10-CM | POA: Diagnosis not present

## 2019-06-06 DIAGNOSIS — J439 Emphysema, unspecified: Secondary | ICD-10-CM

## 2019-06-06 DIAGNOSIS — E7800 Pure hypercholesterolemia, unspecified: Secondary | ICD-10-CM

## 2019-06-06 NOTE — Progress Notes (Signed)
MRN : [id number]  Francisco [name] is a 73 y.o. (May 13, 1946) male who presents with chief complaint of  Chief Complaint  Patient presents with  . Follow-up  .  History of Present Illness:The patient is seen for follow up evaluation of carotid stenosis. The carotid stenosis followed by ultrasound.   The patient denies amaurosis fugax. There is no recent history of TIA symptoms or focal motor deficits. There is no prior documented CVA.  The patient is taking enteric-coated aspirin 81 mg daily.  There is no history of migraine headaches. There is no history of seizures.  Today he is also c/o increased leg pain especially with ambulation.There seems to have been a significant deterioration in the lower extremity symptoms.  The patient notes interval shortening of their claudication distance. No new ulcers or wounds have occurred since the last visit.  The patient has a history of coronary artery disease, no recent episodes of angina or shortness of breath. There is a history of hyperlipidemia which is being treated with a statin.    Carotid Duplex done today shows RICA [id number] and LICA [id number].  No change compared to last study  Current Meds  Medication Sig  . aspirin 81 MG tablet Take 81 mg by mouth every other day.   . azaTHIOprine (IMURAN) 50 MG tablet Take 150 mg by mouth daily.   . CHLOROTHIAZIDE PO Take by mouth.  . cholestyramine (QUESTRAN) 4 GM/DOSE powder   . furosemide (LASIX) 20 MG tablet   . gabapentin (NEURONTIN) 300 MG capsule Take 300 mg by mouth 2 (two) times daily.  Marland Kitchen HYDROcodone-acetaminophen (NORCO/VICODIN) 5-325 MG tablet Take 0.5 tablets by mouth 2 (two) times daily as needed for moderate pain.  Marland Kitchen latanoprost (XALATAN) 0.005 % ophthalmic solution Place 1 drop into both eyes daily.  Marland Kitchen levothyroxine (SYNTHROID, LEVOTHROID) 88 MCG tablet Take 88 mcg by mouth daily.    Marland Kitchen losartan (COZAAR) 50 MG tablet Take 50 mg by mouth 2 (two) times daily.   . pantoprazole  (PROTONIX) 40 MG tablet Take 20 mg by mouth 2 (two) times daily.   . predniSONE (DELTASONE) 20 MG tablet Take 40 mg by mouth every other day.  . pyridostigmine (MESTINON) 60 MG tablet Take 60 mg by mouth daily.   . tamsulosin (FLOMAX) 0.4 MG CAPS capsule TAKE 1 CAPSULE BY MOUTH ONCE DAILY. TAKE 30 MINUTES AFTER SAME MEAL EACH DAY  . Tiotropium Bromide Monohydrate (SPIRIVA RESPIMAT) 1.25 MCG/ACT AERS Inhale 2 puffs into the lungs daily.    Past Medical History:  Diagnosis Date  . Bronchitis, chronic ([place])   . 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 COPD (chronic obstructive pulmonary disease) ([place])   . Hypertension    doesn't have a cardiologist;pt is maintained by medical md for htn;requ[facility]  . Hypothyroidism    pt takes Levothyroxine daily  . Myasthenia gravis, adult form ([place])   . Shortness of breath    Lung MD- Dr Darlin Coco  . Sleep apnea    do not use CPAP every night    Past Surgical History:  Procedure Laterality Date  . ANTERIOR CERVICAL DECOMP/DISCECTOMY FUSION  07/18/2011   Procedure: ANTERIOR CERVICAL DECOMPRESSION/DISCECTOMY FUSION 2 LEVELS;  Surgeon: Cooper Render Pool;  Location: [place] [facility]UR[facility] ORS;  Service: Neurosurgery;  Laterality: N/A;  cervical five-six, cervical six-seven anterior cervical discectomy and fusion  . BACK SURGERY  in [place]     2005 at [facility]  . HERNIA REPAIR Left    inguinal hernia repair in 1997  . LUMBAR LAMINECTOMY/DECOMPRESSION MICRODISCECTOMY Left 02/24/2014   Procedure: LUMBAR LAMINECTOMY/DECOMPRESSION MICRODISCECTOMY LUMBAR THREE-FOUR, FOUR-FIVE, LEFT FIVE-SACRAL ONE ;  Surgeon: Charlie Pitter, MD;  Location: [place] [facility];  Service: Neurosurgery;  Laterality: Left;  LUMBAR LAMINECTOMY/DECOMPRESSION MICRODISCECTOMY LUMBAR THREE-FOUR, FOUR-FIVE, LEFT FIVE-SACRAL ONE   . PROSTATECTOMY  8/14   [facility] Dr Mare Ferrari      Social History Social History   Tobacco Use  . Smoking status: Former Smoker    Packs/day: 1.00    Years: 20.00    Pack years: 20.00    Types: Cigarettes    Quit date: 09/12/2001    Years since quitting: 17.7  . Smokeless tobacco: Never Used  Substance Use Topics  . Alcohol use: Yes    Comment: 6 beverages per week  . Drug use: No    Family History Family History  Problem Relation Age of Onset  . Hypertension Mother   . Stroke Mother   . Stroke Father     Allergies  Allergen Reactions  . Azithromycin Other (See Comments)    Avoid due to myasthenia gravis  . Codeine Nausea And Vomiting     REVIEW OF SYSTEMS (Negative unless checked)  Constitutional: [] Weight loss  [] Fever  [] Chills Cardiac: [] Chest pain   [] Chest pressure   [] Palpitations   [] Shortness of breath when laying flat   [x] Shortness of breath with exertion. Vascular:  [x] Pain in legs with walking   [x] Pain in legs at rest  [] History of DVT   [] Phlebitis   [] Swelling in legs   [] Varicose veins   [] Non-healing ulcers Pulmonary:   [] Uses home oxygen   [] Productive cough   [] Hemoptysis   [] Wheeze  [] COPD   [] Asthma Neurologic:  [] Dizziness   [] Seizures   [] History of stroke   [] History of TIA  [] Aphasia   [] Vissual changes   [] Weakness or numbness in arm   [] Weakness or numbness in leg Musculoskeletal:   [] Joint swelling   [x] Joint pain   [x] Low back pain Hematologic:  [] Easy bruising  [] Easy bleeding   [] Hypercoagulable state   [] Anemic Gastrointestinal:  [] Diarrhea   [] Vomiting  [] Gastroesophageal reflux/heartburn   [] Difficulty swallowing. Genitourinary:  [] Chronic kidney disease   [] Difficult urination  [] Frequent urination   [] Blood in urine Skin:  [] Rashes   [] Ulcers  Psychological:  [] History of anxiety   []  History of major depression.  Physical Examination  Vitals:   06/06/19 1017  BP: (!) 95/57  Pulse: 87  Resp: 16  Weight: 255 lb (115.7 kg)  Height: 5\' 10"  (1.778 m)   Body mass index is 36.59  kg/m. Gen: WD/WN, NAD walks with a cane Head: /AT, No temporalis wasting.  Ear/Nose/Throat: Hearing grossly intact, nares w/o erythema or drainage Eyes: PER, EOMI, sclera nonicteric.  Neck: Supple, no large masses.   Pulmonary:  Good air movement, no audible wheezing bilaterally, no use of accessory muscles.  Cardiac: RRR, no JVD Vascular:  Bilateral carotid bruits Vessel Right Left  Radial Palpable Palpable  Carotid Palpable Palpable  PT Not Palpable Trace Palpable  DP Not Palpable Not Palpable  Gastrointestinal: Non-distended. No guarding/no peritoneal signs.  Musculoskeletal: M/S 5/5 throughout.  No deformity or atrophy.  Neurologic: CN 2-12 intact. Symmetrical.  Speech is fluent. Motor exam as listed above. Psychiatric: Judgment intact, Mood & affect appropriate for pt's clinical situation. Dermatologic:  No rashes or ulcers noted.  No changes consistent with cellulitis. Lymph : No lichenification or skin changes of chronic lymphedema.  CBC Lab Results  Component Value Date   WBC 9.3 03/03/2014   HGB 9.2 (L) 03/03/2014   HCT 26.5 (L) 03/04/2014   MCV 96 03/03/2014   PLT 360 03/03/2014    BMET    Component Value Date/Time   NA 141 03/04/2014 0450   K 3.9 03/04/2014 0450   CL 104 03/04/2014 0450   CO2 28 03/04/2014 0450   GLUCOSE 100 (H) 03/04/2014 0450   BUN 25 (H) 03/04/2014 0450   CREATININE 0.99 03/04/2014 0450   CALCIUM 9.0 03/04/2014 0450   GFRNONAA >60 03/04/2014 0450   GFRAA >60 03/04/2014 0450   CrCl cannot be calculated (Patient's most recent lab result is older than the maximum 21 days allowed.).  COAG No results found for: INR, PROTIME  Radiology No results found.   Assessment/Plan 1. Bilateral carotid artery stenosis Recommend:  Given the patient's asymptomatic subcritical stenosis no further invasive testing or surgery at this time.  Continue antiplatelet therapy as prescribed Continue management of CAD, HTN and Hyperlipidemia Healthy  heart diet,  encouraged exercise at least 4 times per week Follow up in 6 months with duplex ultrasound and physical exam  - VAS US CAROTID; Future  2. PAD (peripheral artery disease) (HCC)  Recommend:  The patient has atypical pain symptoms for pure atherosclerotic disease. However, on physical exam there is evidence of mixed venous and arterial disease, given the diminished pulses and the edema associated with venous changes of the legs.  Noninvasive studies including ABI's and venous ultrasound of the legs will be obtained and the patient will follow up with me to review these studies.  The patient should continue walking and begin a more formal exercise program. The patient should continue his antiplatelet therapy and aggressive treatment of the lipid abnormalities.  The patient should begin wearing graduated compression socks 15-20 mmHg strength to control edema.   - VAS US AORTA/IVC/ILIACS; Future - VAS [place] ABI WITH/WO TBI; Future  3. Essential hypertension, benign Continue antihypertensive medications as already ordered, these medications have been reviewed and there are no changes at this time.   4. Pulmonary emphysema, unspecified emphysema type ([place]) Continue pulmonary medications and aerosols as already ordered, these medications have been reviewed and there are no changes at this time.    5. Pure hypercholesterolemia Continue statin as ordered and reviewed, no changes at this time    Hortencia Pilar, MD  06/06/2019 9:11 PM

## 2019-06-10 ENCOUNTER — Ambulatory Visit (INDEPENDENT_AMBULATORY_CARE_PROVIDER_SITE_OTHER): Payer: No Typology Code available for payment source | Admitting: Nurse Practitioner

## 2019-06-10 ENCOUNTER — Encounter (INDEPENDENT_AMBULATORY_CARE_PROVIDER_SITE_OTHER): Payer: Self-pay | Admitting: Nurse Practitioner

## 2019-06-10 ENCOUNTER — Other Ambulatory Visit: Payer: Self-pay

## 2019-06-10 ENCOUNTER — Ambulatory Visit (INDEPENDENT_AMBULATORY_CARE_PROVIDER_SITE_OTHER): Payer: No Typology Code available for payment source

## 2019-06-10 VITALS — BP 151/69 | HR 93 | Resp 16 | Wt 252.6 lb

## 2019-06-10 DIAGNOSIS — J439 Emphysema, unspecified: Secondary | ICD-10-CM

## 2019-06-10 DIAGNOSIS — I739 Peripheral vascular disease, unspecified: Secondary | ICD-10-CM | POA: Diagnosis not present

## 2019-06-10 DIAGNOSIS — I10 Essential (primary) hypertension: Secondary | ICD-10-CM

## 2019-06-11 ENCOUNTER — Encounter (INDEPENDENT_AMBULATORY_CARE_PROVIDER_SITE_OTHER): Payer: Self-pay | Admitting: Nurse Practitioner

## 2019-06-11 NOTE — Progress Notes (Signed)
SUBJECTIVE:  Patient ID: Francisco [name], male    DOB: May 31, 1946, 73 y.o.   MRN: [id number] Chief Complaint  Patient presents with  . Follow-up    ultrasound follow up    HPI  Francisco [name] is a 73 y.o. male that presents today for follow-up with concern for peripheral artery disease.  The patient endorses having some lower extremity pain mainly in the left lower extremity within the hip area.  He states that this pain occurs at random times however given his carotid artery stenosis he was concerned about possible peripheral artery disease.  The patient also has had a history of repair of 2 lower lumbar disks.  He states that sometimes the pain occurs with sitting sometimes occurs when he is laying down sometimes it occurs when he is walking he can never reliably predicted.  He denies any consistent cramping or pain with walking that eases with rest.  He denies any rest pain like symptoms.  He denies any ulcerations of his bilateral lower extremities.  He denies any chest pain or shortness of breath.  He denies any fever, chills, nausea, vomiting or diarrhea.  Today the patient underwent bilateral ABIs which reveals that the patient has an ABI of 1.09 on the right and 1.16 on the left.  The patient has triphasic waveforms in the left tibial arteries.  The right anterior tibial artery has triphasic waveforms with biphasic posterior tibial artery waveforms.  He has strong toe waveforms bilaterally.  An aortoiliac duplex was also done which reveals possible greater than 50% stenosis within the right common iliac artery.  It was also noted that the patient has a 2.9 cm measurement of the proximal aorta.  Past Medical History:  Diagnosis Date  . Bronchitis, chronic ([place])   . 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 COPD (chronic obstructive pulmonary disease) ([place])   . Hypertension    doesn't have a cardiologist;pt is  maintained by medical md for htn;requested ekg/cxr from [facility]  . Hypothyroidism    pt takes Levothyroxine daily  . Myasthenia gravis, adult form ([place])   . Shortness of breath    Lung MD- Dr Darlin Coco  . Sleep apnea    do not use CPAP every night    Past Surgical History:  Procedure Laterality Date  . ANTERIOR CERVICAL DECOMP/DISCECTOMY FUSION  07/18/2011   Procedure: ANTERIOR CERVICAL DECOMPRESSION/DISCECTOMY FUSION 2 LEVELS;  Surgeon: Cooper Render Pool;  Location: [facility];  Service: Neurosurgery;  Laterality: N/A;  cervical five-six, cervical six-seven anterior cervical discectomy and fusion  . BACK SURGERY     in [place]     2005 at [facility]  . HERNIA REPAIR Left    inguinal hernia repair in 1997  . LUMBAR LAMINECTOMY/DECOMPRESSION MICRODISCECTOMY Left 02/24/2014   Procedure: LUMBAR LAMINECTOMY/DECOMPRESSION MICRODISCECTOMY LUMBAR THREE-FOUR, FOUR-FIVE, LEFT FIVE-SACRAL ONE ;  Surgeon: Charlie Pitter, MD;  Location: [facility];  Service: Neurosurgery;  Laterality: Left;  LUMBAR LAMINECTOMY/DECOMPRESSION MICRODISCECTOMY LUMBAR THREE-FOUR, FOUR-FIVE, LEFT FIVE-SACRAL ONE   . PROSTATECTOMY  8/14   [facility] Dr Mare Ferrari     Social History   Socioeconomic History  . Marital status: Married    Spouse name: Not on file  . Number of children: Not on file  . Years of education: Not on file  . Highest education level: Not on file  Occupational History  .  Not on file  Social Needs  . Financial resource strain: Not on file  . Food insecurity    Worry: Not on file    Inability: Not on file  . Transportation needs    Medical: Not on file    Non-medical: Not on file  Tobacco Use  . Smoking status: Former Smoker    Packs/day: 1.00    Years: 20.00    Pack years: 20.00    Types: Cigarettes    Quit date: 09/12/2001    Years since quitting: 17.7  . Smokeless tobacco: Never Used  Substance and Sexual Activity  . Alcohol use: Yes    Comment: 6  beverages per week  . Drug use: No  . Sexual activity: Yes  Lifestyle  . Physical activity    Days per week: Not on file    Minutes per session: Not on file  . Stress: Not on file  Relationships  . Social Herbalist on phone: Not on file    Gets together: Not on file    Attends religious service: Not on file    Active member of club or organization: Not on file    Attends meetings of clubs or organizations: Not on file    Relationship status: Not on file  . Intimate partner violence    Fear of current or ex partner: Not on file    Emotionally abused: Not on file    Physically abused: Not on file    Forced sexual activity: Not on file  Other Topics Concern  . Not on file  Social History Narrative  . Not on file    Family History  Problem Relation Age of Onset  . Hypertension Mother   . Stroke Mother   . Stroke Father     Allergies  Allergen Reactions  . Azithromycin Other (See Comments)    Avoid due to myasthenia gravis  . Codeine Nausea And Vomiting     Review of Systems   Review of Systems: Negative Unless Checked Constitutional: [] Weight loss  [] Fever  [] Chills Cardiac: [] Chest pain   []  Atrial Fibrillation  [] Palpitations   [] Shortness of breath when laying flat   [] Shortness of breath with exertion. [] Shortness of breath at rest Vascular:  [x] Pain in legs with walking   [x] Pain in legs with standing [x] Pain in legs when laying flat   [] Claudication    [] Pain in feet when laying flat    [] History of DVT   [] Phlebitis   [] Swelling in legs   [] Varicose veins   [] Non-healing ulcers Pulmonary:   [] Uses home oxygen   [] Productive cough   [] Hemoptysis   [] Wheeze  [] COPD   [] Asthma Neurologic:  [] Dizziness   [] Seizures  [] Blackouts [] History of stroke   [] History of TIA  [] Aphasia   [] Temporary Blindness   [] Weakness or numbness in arm   [] Weakness or numbness in leg Musculoskeletal:   [] Joint swelling   [] Joint pain   [] Low back pain  []  History of Knee  Replacement [] Arthritis [] back Surgeries  []  Spinal Stenosis    Hematologic:  [] Easy bruising  [] Easy bleeding   [] Hypercoagulable state   [] Anemic Gastrointestinal:  [] Diarrhea   [] Vomiting  [] Gastroesophageal reflux/heartburn   [] Difficulty swallowing. [] Abdominal pain Genitourinary:  [] Chronic kidney disease   [] Difficult urination  [] Anuric   [] Blood in urine [] Frequent urination  [] Burning with urination   [] Hematuria Skin:  [] Rashes   [] Ulcers [] Wounds Psychological:  [] History of anxiety   []  History of  major depression  []  Memory Difficulties      OBJECTIVE:   Physical Exam  BP (!) 151/69 (BP Location: Right Arm)   Pulse 93   Resp 16   Wt 252 lb 9.6 oz (114.6 kg)   BMI 36.24 kg/m   Gen: WD/WN, NAD Head: [place]/AT, No temporalis wasting.  Ear/Nose/Throat: Hearing grossly intact, nares w/o erythema or drainage Eyes: PER, EOMI, sclera nonicteric.  Neck: Supple, no masses.  No JVD.  Pulmonary:  Good air movement, no use of accessory muscles.  Cardiac: RRR Vascular:  Vessel Right Left  Radial Palpable Palpable  Dorsalis Pedis Not Palpable Not Palpable  Posterior Tibial Not Palpable Not Palpable   Gastrointestinal: soft, non-distended. No guarding/no peritoneal signs.  Musculoskeletal: M/S 5/5 throughout.  No deformity or atrophy.  Neurologic: Pain and light touch intact in extremities.  Symmetrical.  Speech is fluent. Motor exam as listed above. Psychiatric: Judgment intact, Mood & affect appropriate for pt's clinical situation. Dermatologic: No Venous rashes. No Ulcers Noted.  No changes consistent with cellulitis. Lymph : No Cervical lymphadenopathy, no lichenification or skin changes of chronic lymphedema.       ASSESSMENT AND PLAN:  1. PAD (peripheral artery disease) (HCC) Based on patient's noninvasive studies as well as his description of symptoms, it is likely that the pain is not due to peripheral artery disease although he does have a small component.  Based on the  studies as well as the enlargement within the aorta, it would be prudent to repeat the patient studies in 2 years.  However, the patient is advised that if he begins to develop claudication-like symptoms, rest pain or lower extremity ulcerations he should let us know as we can repeat these test to see if there have been any changes.  2. Essential hypertension, benign Continue antihypertensive medications as already ordered, these medications have been reviewed and there are no changes at this time.   3. Pulmonary emphysema, unspecified emphysema type ([place]) Continue pulmonary medications and aerosols as already ordered, these medications have been reviewed and there are no changes at this time.     Current Outpatient Medications on File Prior to Visit  Medication Sig Dispense Refill  . aspirin 81 MG tablet Take 81 mg by mouth every other day.     . azaTHIOprine (IMURAN) 50 MG tablet Take 150 mg by mouth daily.     . CHLOROTHIAZIDE PO Take by mouth.    . cholestyramine (QUESTRAN) 4 GM/DOSE powder     . furosemide (LASIX) 20 MG tablet     . gabapentin (NEURONTIN) 300 MG capsule Take 300 mg by mouth 2 (two) times daily.    . hydrochlorothiazide (HYDRODIURIL) 25 MG tablet TAKE 1/2 TABLET (12.5 MG TOTAL) BY MOUTH ONCE DAILY  3  . HYDROcodone-acetaminophen (NORCO/VICODIN) 5-325 MG tablet Take 0.5 tablets by mouth 2 (two) times daily as needed for moderate pain.    Marland Kitchen latanoprost (XALATAN) 0.005 % ophthalmic solution Place 1 drop into both eyes daily.  99  . levothyroxine (SYNTHROID, LEVOTHROID) 88 MCG tablet Take 88 mcg by mouth daily.      Marland Kitchen losartan (COZAAR) 50 MG tablet Take 50 mg by mouth 2 (two) times daily.     . pantoprazole (PROTONIX) 40 MG tablet Take 20 mg by mouth 2 (two) times daily.     . predniSONE (DELTASONE) 20 MG tablet Take 40 mg by mouth every other day.    . pyridostigmine (MESTINON) 60 MG tablet Take 60 mg by mouth  daily.     . tamsulosin (FLOMAX) 0.4 MG CAPS capsule TAKE 1  CAPSULE BY MOUTH ONCE DAILY. TAKE 30 MINUTES AFTER SAME MEAL EACH DAY    . Tiotropium Bromide Monohydrate (SPIRIVA RESPIMAT) 1.25 MCG/ACT AERS Inhale 2 puffs into the lungs daily. 4 g 2   No current facility-administered medications on file prior to visit.     There are no Patient Instructions on file for this visit. No follow-ups on file.   Kris Hartmann, NP  This note was completed with Sales executive.  Any errors are purely unintentional.

## 2019-08-19 ENCOUNTER — Other Ambulatory Visit: Payer: Self-pay

## 2019-08-19 DIAGNOSIS — J449 Chronic obstructive pulmonary disease, unspecified: Secondary | ICD-10-CM

## 2019-08-19 MED ORDER — SPIRIVA RESPIMAT 1.25 MCG/ACT IN AERS: 2.0000 | INHALATION_SPRAY | Freq: Every day | RESPIRATORY_TRACT | 2 refills | Status: DC

## 2019-10-15 ENCOUNTER — Telehealth: Payer: Self-pay

## 2019-10-15 NOTE — Telephone Encounter (Signed)
Patient LVM yesterday to reschedule his colonoscopy that was canceled due to [place].  Called him back this am to let him know that his colonoscopy was scheduled with Dr. Alice Reichert at [facility] and provided him with Dr. Ricky Stabs office number to call to reschedule.  Thanks,  [place], [place]

## 2019-10-23 ENCOUNTER — Ambulatory Visit: Admit: 2019-10-23 | Payer: No Typology Code available for payment source | Admitting: Internal Medicine

## 2019-10-23 SURGERY — COLONOSCOPY WITH PROPOFOL
Anesthesia: General

## 2019-10-24 DIAGNOSIS — M961 Postlaminectomy syndrome, not elsewhere classified: Secondary | ICD-10-CM | POA: Insufficient documentation

## 2019-10-30 DIAGNOSIS — M791 Myalgia, unspecified site: Secondary | ICD-10-CM | POA: Insufficient documentation

## 2019-11-11 ENCOUNTER — Other Ambulatory Visit: Payer: Self-pay

## 2019-11-11 DIAGNOSIS — J449 Chronic obstructive pulmonary disease, unspecified: Secondary | ICD-10-CM

## 2019-11-11 MED ORDER — SPIRIVA RESPIMAT 1.25 MCG/ACT IN AERS: 2.0000 | INHALATION_SPRAY | Freq: Every day | RESPIRATORY_TRACT | 0 refills | Status: DC

## 2019-11-14 ENCOUNTER — Ambulatory Visit: Admit: 2019-11-14 | Payer: No Typology Code available for payment source | Admitting: Internal Medicine

## 2019-11-14 SURGERY — ESOPHAGOGASTRODUODENOSCOPY (EGD) WITH PROPOFOL
Anesthesia: General

## 2019-12-10 ENCOUNTER — Encounter (INDEPENDENT_AMBULATORY_CARE_PROVIDER_SITE_OTHER): Payer: Medicare Other

## 2019-12-10 ENCOUNTER — Ambulatory Visit (INDEPENDENT_AMBULATORY_CARE_PROVIDER_SITE_OTHER): Payer: No Typology Code available for payment source | Admitting: Vascular Surgery

## 2019-12-25 DIAGNOSIS — Z6834 Body mass index (BMI) 34.0-34.9, adult: Secondary | ICD-10-CM | POA: Insufficient documentation

## 2019-12-26 ENCOUNTER — Encounter: Payer: Self-pay | Admitting: Ophthalmology

## 2019-12-26 ENCOUNTER — Other Ambulatory Visit: Payer: Self-pay

## 2020-01-02 ENCOUNTER — Other Ambulatory Visit: Payer: Self-pay

## 2020-01-02 ENCOUNTER — Other Ambulatory Visit
Admission: RE | Admit: 2020-01-02 | Discharge: 2020-01-02 | Disposition: A | Discharge status: Home / Self Care | Payer: Medicare Other | Source: Ambulatory Visit | Attending: Ophthalmology | Admitting: Ophthalmology

## 2020-01-02 DIAGNOSIS — Z20822 Contact with and (suspected) exposure to covid-19: Secondary | ICD-10-CM | POA: Diagnosis not present

## 2020-01-02 DIAGNOSIS — Z01812 Encounter for preprocedural laboratory examination: Secondary | ICD-10-CM | POA: Insufficient documentation

## 2020-01-02 LAB — SARS CORONAVIRUS 2 (TAT 6-24 HRS): SARS Coronavirus 2: NEGATIVE

## 2020-01-02 NOTE — Discharge Instructions (Signed)
General Anesthesia, Adult, Care After This sheet gives you information about how to care for yourself after your procedure. Your health care provider may also give you more specific instructions. If you have problems or questions, contact your health care provider. What can I expect after the procedure? After the procedure, the following side effects are common:  Pain or discomfort at the IV site.  Nausea.  Vomiting.  Sore throat.  Trouble concentrating.  Feeling cold or chills.  Weak or tired.  Sleepiness and fatigue.  Soreness and body aches. These side effects can affect parts of the body that were not involved in surgery. Follow these instructions at home:  For at least 24 hours after the procedure:  Have a responsible adult stay with you. It is important to have someone help care for you until you are awake and alert.  Rest as needed.  Do not: ? Participate in activities in which you could fall or become injured. ? Drive. ? Use heavy machinery. ? Drink alcohol. ? Take sleeping pills or medicines that cause drowsiness. ? Make important decisions or sign legal documents. ? Take care of children on your own. Eating and drinking  Follow any instructions from your health care provider about eating or drinking restrictions.  When you feel hungry, start by eating small amounts of foods that are soft and easy to digest (bland), such as toast. Gradually return to your regular diet.  Drink enough fluid to keep your urine pale yellow.  If you vomit, rehydrate by drinking water, juice, or clear broth. General instructions  If you have sleep apnea, surgery and certain medicines can increase your risk for breathing problems. Follow instructions from your health care provider about wearing your sleep device: ? Anytime you are sleeping, including during daytime naps. ? While taking prescription pain medicines, sleeping medicines, or medicines that make you drowsy.  Return to  your normal activities as told by your health care provider. Ask your health care provider what activities are safe for you.  Take over-the-counter and prescription medicines only as told by your health care provider.  If you smoke, do not smoke without supervision.  Keep all follow-up visits as told by your health care provider. This is important. Contact a health care provider if:  You have nausea or vomiting that does not get better with medicine.  You cannot eat or drink without vomiting.  You have pain that does not get better with medicine.  You are unable to pass urine.  You develop a skin rash.  You have a fever.  You have redness around your IV site that gets worse. Get help right away if:  You have difficulty breathing.  You have chest pain.  You have blood in your urine or stool, or you vomit blood. Summary  After the procedure, it is common to have a sore throat or nausea. It is also common to feel tired.  Have a responsible adult stay with you for the first 24 hours after general anesthesia. It is important to have someone help care for you until you are awake and alert.  When you feel hungry, start by eating small amounts of foods that are soft and easy to digest (bland), such as toast. Gradually return to your regular diet.  Drink enough fluid to keep your urine pale yellow.  Return to your normal activities as told by your health care provider. Ask your health care provider what activities are safe for you. This information is not   intended to replace advice given to you by your health care provider. Make sure you discuss any questions you have with your health care provider. Document Revised: 09/01/2017 Document Reviewed: 04/14/2017 Elsevier Patient Education  2020 Elsevier Inc.  Cataract Surgery, Care After This sheet gives you information about how to care for yourself after your procedure. Your health care provider may also give you more specific  instructions. If you have problems or questions, contact your health care provider. What can I expect after the procedure? After the procedure, it is common to have:  Itching.  Discomfort.  Fluid discharge.  Sensitivity to light and to touch.  Bruising in or around the eye.  Mild blurred vision. Follow these instructions at home: Eye care   Do not touch or rub your eyes.  Protect your eyes as told by your health care provider. You may be told to wear a protective eye shield or sunglasses.  Do not put a contact lens into the affected eye or eyes until your health care provider approves.  Keep the area around your eye clean and dry: ? Avoid swimming. ? Do not allow water to hit you directly in the face while showering. ? Keep soap and shampoo out of your eyes.  Check your eye every day for signs of infection. Watch for: ? Redness, swelling, or pain. ? Fluid, blood, or pus. ? Warmth. ? A bad smell. ? Vision that is getting worse. ? Sensitivity that is getting worse. Activity  Do not drive for 24 hours if you were given a sedative during your procedure.  Avoid strenuous activities, such as playing contact sports, for as long as told by your health care provider.  Do not drive or use heavy machinery until your health care provider approves.  Do not bend or lift heavy objects. Bending increases pressure in the eye. You can walk, climb stairs, and do light household chores.  Ask your health care provider when you can return to work. If you work in a dusty environment, you may be advised to wear protective eyewear for a period of time. General instructions  Take or apply over-the-counter and prescription medicines only as told by your health care provider. This includes eye drops.  Keep all follow-up visits as told by your health care provider. This is important. Contact a health care provider if:  You have increased bruising around your eye.  You have pain that is  not helped with medicine.  You have a fever.  You have redness, swelling, or pain in your eye.  You have fluid, blood, or pus coming from your incision.  Your vision gets worse.  Your sensitivity to light gets worse. Get help right away if:  You have sudden loss of vision.  You see flashes of light or spots (floaters).  You have severe eye pain.  You develop nausea or vomiting. Summary  After your procedure, it is common to have itching, discomfort, bruising, fluid discharge, or sensitivity to light.  Follow instructions from your health care provider about caring for your eye after the procedure.  Do not rub your eye after the procedure. You may need to wear eye protection or sunglasses. Do not wear contact lenses. Keep the area around your eye clean and dry.  Avoid activities that require a lot of effort. These include playing sports and lifting heavy objects.  Contact a health care provider if you have increased bruising, pain that does not go away, or a fever. Get help right   away if you suddenly lose your vision, see flashes of light or spots, or have severe pain in the eye. This information is not intended to replace advice given to you by your health care provider. Make sure you discuss any questions you have with your health care provider. Document Revised: 06/25/2019 Document Reviewed: 02/26/2018 Elsevier Patient Education  2020 Elsevier Inc.  

## 2020-01-06 ENCOUNTER — Ambulatory Visit: Payer: Medicare Other | Admitting: Anesthesiology

## 2020-01-06 ENCOUNTER — Encounter
Admission: RE | Disposition: A | Discharge status: Home / Self Care | Payer: Self-pay | Source: Home / Self Care | Attending: Ophthalmology

## 2020-01-06 ENCOUNTER — Encounter: Payer: Self-pay | Admitting: Ophthalmology

## 2020-01-06 ENCOUNTER — Other Ambulatory Visit: Payer: Self-pay

## 2020-01-06 ENCOUNTER — Ambulatory Visit
Admission: RE | Admit: 2020-01-06 | Discharge: 2020-01-06 | Disposition: A | Discharge status: Home / Self Care | Payer: Medicare Other | Attending: Ophthalmology | Admitting: Ophthalmology

## 2020-01-06 DIAGNOSIS — K219 Gastro-esophageal reflux disease without esophagitis: Secondary | ICD-10-CM | POA: Insufficient documentation

## 2020-01-06 DIAGNOSIS — H401121 Primary open-angle glaucoma, left eye, mild stage: Secondary | ICD-10-CM | POA: Diagnosis not present

## 2020-01-06 DIAGNOSIS — H2512 Age-related nuclear cataract, left eye: Secondary | ICD-10-CM | POA: Insufficient documentation

## 2020-01-06 DIAGNOSIS — E039 Hypothyroidism, unspecified: Secondary | ICD-10-CM | POA: Insufficient documentation

## 2020-01-06 DIAGNOSIS — Z8546 Personal history of malignant neoplasm of prostate: Secondary | ICD-10-CM | POA: Insufficient documentation

## 2020-01-06 DIAGNOSIS — E669 Obesity, unspecified: Secondary | ICD-10-CM | POA: Diagnosis not present

## 2020-01-06 DIAGNOSIS — Z85850 Personal history of malignant neoplasm of thyroid: Secondary | ICD-10-CM | POA: Insufficient documentation

## 2020-01-06 DIAGNOSIS — J449 Chronic obstructive pulmonary disease, unspecified: Secondary | ICD-10-CM | POA: Diagnosis not present

## 2020-01-06 DIAGNOSIS — Z7982 Long term (current) use of aspirin: Secondary | ICD-10-CM | POA: Diagnosis not present

## 2020-01-06 DIAGNOSIS — Z7952 Long term (current) use of systemic steroids: Secondary | ICD-10-CM | POA: Insufficient documentation

## 2020-01-06 DIAGNOSIS — G4730 Sleep apnea, unspecified: Secondary | ICD-10-CM | POA: Insufficient documentation

## 2020-01-06 DIAGNOSIS — I10 Essential (primary) hypertension: Secondary | ICD-10-CM | POA: Insufficient documentation

## 2020-01-06 DIAGNOSIS — Z6835 Body mass index (BMI) 35.0-35.9, adult: Secondary | ICD-10-CM | POA: Diagnosis not present

## 2020-01-06 DIAGNOSIS — Z79890 Hormone replacement therapy (postmenopausal): Secondary | ICD-10-CM | POA: Insufficient documentation

## 2020-01-06 DIAGNOSIS — Z79899 Other long term (current) drug therapy: Secondary | ICD-10-CM | POA: Diagnosis not present

## 2020-01-06 HISTORY — PX: CATARACT EXTRACTION W/PHACO: SHX586

## 2020-01-06 HISTORY — DX: Unspecified osteoarthritis, unspecified site: M19.90

## 2020-01-06 HISTORY — DX: Pure hypercholesterolemia, unspecified: E78.00

## 2020-01-06 HISTORY — DX: Anesthesia of skin: R20.0

## 2020-01-06 SURGERY — PHACOEMULSIFICATION, CATARACT, WITH IOL INSERTION
Anesthesia: Monitor Anesthesia Care | Laterality: Left

## 2020-01-06 MED ORDER — ACETAMINOPHEN 160 MG/5ML PO SOLN: 325.0000 mg | ORAL | Status: DC | PRN

## 2020-01-06 MED ORDER — ACETAMINOPHEN 325 MG PO TABS: 325.0000 mg | ORAL_TABLET | ORAL | Status: DC | PRN

## 2020-01-06 MED ORDER — SODIUM HYALURONATE 10 MG/ML IO SOLN
INTRAOCULAR | Status: DC | PRN
  Administered 2020-01-06: 0.55 mL via INTRAOCULAR

## 2020-01-06 MED ORDER — ONDANSETRON HCL 4 MG/2ML IJ SOLN: 4.0000 mg | Freq: Once | INTRAMUSCULAR | Status: DC | PRN

## 2020-01-06 MED ORDER — ARMC OPHTHALMIC DILATING DROPS
1.0000 "application " | OPHTHALMIC | Status: DC | PRN
  Administered 2020-01-06 (×3): 1 via OPHTHALMIC

## 2020-01-06 MED ORDER — MOXIFLOXACIN HCL 0.5 % OP SOLN
OPHTHALMIC | Status: DC | PRN
  Administered 2020-01-06: 0.2 mL via OPHTHALMIC

## 2020-01-06 MED ORDER — SODIUM HYALURONATE 23 MG/ML IO SOLN
INTRAOCULAR | Status: DC | PRN
  Administered 2020-01-06: 0.6 mL via INTRAOCULAR

## 2020-01-06 MED ORDER — LIDOCAINE HCL (PF) 2 % IJ SOLN
INTRAOCULAR | Status: DC | PRN
  Administered 2020-01-06: 09:00:00 1 mL via INTRAOCULAR

## 2020-01-06 MED ORDER — MIDAZOLAM HCL 2 MG/2ML IJ SOLN
INTRAMUSCULAR | Status: DC | PRN
  Administered 2020-01-06: 2 mg via INTRAVENOUS

## 2020-01-06 MED ORDER — EPINEPHRINE PF 1 MG/ML IJ SOLN
INTRAOCULAR | Status: DC | PRN
  Administered 2020-01-06: 77 mL via OPHTHALMIC

## 2020-01-06 MED ORDER — TETRACAINE HCL 0.5 % OP SOLN
1.0000 [drp] | OPHTHALMIC | Status: DC | PRN
  Administered 2020-01-06 (×3): 1 [drp] via OPHTHALMIC

## 2020-01-06 MED ORDER — FENTANYL CITRATE (PF) 100 MCG/2ML IJ SOLN
INTRAMUSCULAR | Status: DC | PRN
  Administered 2020-01-06 (×2): 50 ug via INTRAVENOUS

## 2020-01-06 SURGICAL SUPPLY — 22 items
CANNULA ANT/CHMB 27GA (MISCELLANEOUS) ×4 IMPLANT
DEVICE INJECT ISTENT W (Stent) ×1 IMPLANT
DISSECTOR HYDRO NUCLEUS 50X22 (MISCELLANEOUS) ×2 IMPLANT
GLOVE SURG LX 7.5 STRW (GLOVE) ×1
GLOVE SURG LX STRL 7.5 STRW (GLOVE) ×1 IMPLANT
GLOVE SURG SYN 8.5  E (GLOVE) ×1
GLOVE SURG SYN 8.5 E (GLOVE) ×1 IMPLANT
GONIO PRISM W/BUILT IN CLIP (KITS) ×2
GONIOPRISM W/BUILT IN CLIP (KITS) ×1 IMPLANT
GOWN STRL REUS W/ TWL LRG LVL3 (GOWN DISPOSABLE) ×2 IMPLANT
GOWN STRL REUS W/TWL LRG LVL3 (GOWN DISPOSABLE) ×2
INJECT ISTENT W (Stent) ×2 IMPLANT
LENS IOL DIOP 22.5 (Intraocular Lens) ×2 IMPLANT
LENS IOL TECNIS MONO 22.5 (Intraocular Lens) ×1 IMPLANT
MARKER SKIN DUAL TIP RULER LAB (MISCELLANEOUS) ×2 IMPLANT
PACK DR. KING ARMS (PACKS) ×2 IMPLANT
PACK EYE AFTER SURG (MISCELLANEOUS) ×2 IMPLANT
PACK OPTHALMIC (MISCELLANEOUS) ×2 IMPLANT
SYR 3ML LL SCALE MARK (SYRINGE) ×2 IMPLANT
SYR TB 1ML LUER SLIP (SYRINGE) ×2 IMPLANT
WATER STERILE IRR 250ML POUR (IV SOLUTION) ×2 IMPLANT
WIPE NON LINTING 3.25X3.25 (MISCELLANEOUS) ×2 IMPLANT

## 2020-01-06 NOTE — H&P (Signed)

## 2020-01-06 NOTE — Anesthesia Preprocedure Evaluation (Signed)
Anesthesia Evaluation  Patient identified by MRN, date of birth, ID band Patient awake    History of Anesthesia Complications Negative for: history of anesthetic complications  Airway Mallampati: II  TM Distance: >3 FB Neck ROM: Full    Dental no notable dental hx.    Pulmonary sleep apnea , COPD, former smoker,    Pulmonary exam normal        Cardiovascular Exercise Tolerance: Good hypertension, Pt. on medications Normal cardiovascular exam     Neuro/Psych  Neuromuscular disease (myasthenia gravis)    GI/Hepatic Neg liver ROS, GERD  ,  Endo/Other  Hypothyroidism Obese (BMI 35)  Renal/GU negative Renal ROS     Musculoskeletal   Abdominal   Peds  Hematology negative hematology ROS (+)   Anesthesia Other Findings   Reproductive/Obstetrics                            Anesthesia Physical Anesthesia Plan  ASA: III  Anesthesia Plan: MAC   Post-op Pain Management:    Induction: Intravenous  PONV Risk Score and Plan: 1 and Midazolam  Airway Management Planned: Natural Airway and Nasal Cannula  Additional Equipment: None  Intra-op Plan:   Post-operative Plan:   Informed Consent: I have reviewed the patients History and Physical, chart, labs and discussed the procedure including the risks, benefits and alternatives for the proposed anesthesia with the patient or authorized representative who has indicated his/her understanding and acceptance.       Plan Discussed with: CRNA  Anesthesia Plan Comments:         Anesthesia Quick Evaluation

## 2020-01-06 NOTE — Anesthesia Procedure Notes (Signed)
Procedure Name: MAC Performed by: Izetta Dakin, CRNA Pre-anesthesia Checklist: Timeout performed, Patient being monitored, Emergency Drugs available and Suction available Patient Re-evaluated:Patient Re-evaluated prior to induction Oxygen Delivery Method: Nasal cannula

## 2020-01-06 NOTE — Transfer of Care (Signed)
Immediate Anesthesia Transfer of Care Note  Patient: Francisco [name]  Procedure(s) Performed: CATARACT EXTRACTION PHACO AND INTRAOCULAR LENS PLACEMENT (IOC) ISTENT INJ LEFT 3.81  00:33.3 (Left )  Patient Location: PACU  Anesthesia Type: MAC  Level of Consciousness: awake, alert  and patient cooperative  Airway and Oxygen Therapy: Patient Spontanous Breathing and Patient connected to supplemental oxygen  Post-op Assessment: Post-op Vital signs reviewed, Patient's Cardiovascular Status Stable, Respiratory Function Stable, Patent Airway and No signs of Nausea or vomiting  Post-op Vital Signs: Reviewed and stable  Complications: No apparent anesthesia complications

## 2020-01-06 NOTE — Anesthesia Postprocedure Evaluation (Signed)
Anesthesia Post Note  Patient: Francisco [name]  Procedure(s) Performed: CATARACT EXTRACTION PHACO AND INTRAOCULAR LENS PLACEMENT (IOC) ISTENT INJ LEFT 3.81  00:33.3 (Left )     Patient location during evaluation: PACU Anesthesia Type: MAC Level of consciousness: awake and alert Pain management: pain level controlled Vital Signs Assessment: post-procedure vital signs reviewed and stable Respiratory status: spontaneous breathing Cardiovascular status: blood pressure returned to baseline Postop Assessment: no apparent nausea or vomiting, adequate PO intake and no headache Anesthetic complications: no    Adele Barthel Kenzee Bassin

## 2020-01-06 NOTE — Op Note (Signed)
OPERATIVE NOTE  LAKEITH CAREAGA [id number] 01/06/2020  PREOPERATIVE DIAGNOSIS:   1.  Mild  PRIMARY open angle glaucoma, left eye. O29.4765  2.  Nuclear sclerotic cataract left eye.  H25.12   POSTOPERATIVE DIAGNOSIS:    same.   PROCEDURE:   1.  Placement of trabecular bypass stent (istent). CPT 0191T  and placement of additional stent  CPT 0376T 2.  Phacoemusification with posterior chamber intraocular lens placement of the right eye  CPT [phone]   LENS: Implant Name Type Inv. Item Serial No. Manufacturer Lot No. LRB No. Used Action  Marko Stai - [id number] Stent Marko Stai [id number] [employer] [id number] Left 1 Implanted  LENS IOL DIOP 22.5 - [id number] Intraocular Lens LENS IOL DIOP 22.5 [id number] AMO  Left 1 Implanted      Procedure(s): CATARACT EXTRACTION PHACO AND INTRAOCULAR LENS PLACEMENT (IOC) ISTENT INJ LEFT 3.81  00:33.3 (Left)   ULTRASOUND TIME: 0 minutes 33 seconds.  CDE 3.81   SURGEON:  Benay Pillow, MD, MPH  ANESTHESIOLOGIST: Anesthesiologist: Page, Adele Barthel, MD CRNA: Izetta Dakin, CRNA   ANESTHESIA:  MAC and intracameral preservative-free intracameral lidocaine 4%.  ESTIMATED BLOOD LOSS: less than 1 mL.   COMPLICATIONS:  None.   DESCRIPTION OF PROCEDURE:  The patient was identified in the holding room and transported to the operating room.   The patient was placed in the supine position under the operating microscope. The left eye was prepped and draped in the usual sterile ophthalmic fashion.   A 1.0 millimeter clear-corneal paracentesis was made at the 4:30 position. 0.5 ml of preservative-free 1% lidocaine with epinephrine was injected into the anterior chamber.  The anterior chamber was filled with Healon 5 viscoelastic.  A 2.4 millimeter keratome was used to make a near-clear corneal incision at the 2:00 position.   Attention was turned to the istent.  The patients head was turned to the left and the microscope was tilted to  035 degrees.  Ocular instruments/[employer] OAL/H2 gonioprism was used with IPC05 (iclip) coupled with Healon 5 on the cornea was used to visualize the trabecular meshwork. The is2tent was opened and introduced into the eye.  The meshwork was engaged with the tip of the iStent injector and the stent was deployed into Schlemm's canal at 10:30.  The second stent was deployed at 8:00.  The stents were well seated and in good position.  Next, attention was turned to the phacoemulsification A curvilinear capsulorrhexis was made with a cystotome and capsulorrhexis forceps.  Balanced salt solution was used to hydrodissect and hydrodelineate the nucleus.   Phacoemulsification was then used in stop and chop fashion to remove the lens nucleus and epinucleus.  The remaining cortex was then removed using the irrigation and aspiration handpiece. Healon was then placed into the capsular bag to distend it for lens placement.  A lens was then injected into the capsular bag.  The remaining viscoelastic was aspirated.   Wounds were hydrated with balanced salt solution.  The anterior chamber was inflated to a physiologic pressure with balanced salt solution.    Intracameral vigamox 0.1 mL undiluted was injected into the eye and a drop placed onto the ocular surface.  No wound leaks were noted. The patient was taken to the recovery room in stable condition without complications of anesthesia or surgery   Benay Pillow 01/06/2020, 9:12 AM

## 2020-01-07 ENCOUNTER — Encounter: Payer: Self-pay | Admitting: *Deleted

## 2020-01-30 ENCOUNTER — Other Ambulatory Visit
Admission: RE | Admit: 2020-01-30 | Discharge: 2020-01-30 | Disposition: A | Discharge status: Home / Self Care | Payer: Medicare Other | Source: Ambulatory Visit | Attending: Ophthalmology | Admitting: Ophthalmology

## 2020-01-30 DIAGNOSIS — Z01812 Encounter for preprocedural laboratory examination: Secondary | ICD-10-CM | POA: Insufficient documentation

## 2020-01-30 DIAGNOSIS — Z20822 Contact with and (suspected) exposure to covid-19: Secondary | ICD-10-CM | POA: Insufficient documentation

## 2020-01-30 NOTE — Discharge Instructions (Signed)

## 2020-01-31 LAB — SARS CORONAVIRUS 2 (TAT 6-24 HRS): SARS Coronavirus 2: NEGATIVE

## 2020-02-03 ENCOUNTER — Other Ambulatory Visit: Payer: Self-pay

## 2020-02-03 ENCOUNTER — Encounter
Admission: RE | Disposition: A | Discharge status: Home / Self Care | Payer: Self-pay | Source: Home / Self Care | Attending: Ophthalmology

## 2020-02-03 ENCOUNTER — Ambulatory Visit
Admission: RE | Admit: 2020-02-03 | Discharge: 2020-02-03 | Disposition: A | Discharge status: Home / Self Care | Payer: Medicare Other | Attending: Ophthalmology | Admitting: Ophthalmology

## 2020-02-03 ENCOUNTER — Ambulatory Visit: Payer: Medicare Other | Admitting: Anesthesiology

## 2020-02-03 ENCOUNTER — Encounter: Payer: Self-pay | Admitting: Ophthalmology

## 2020-02-03 DIAGNOSIS — Z9842 Cataract extraction status, left eye: Secondary | ICD-10-CM | POA: Insufficient documentation

## 2020-02-03 DIAGNOSIS — H2101 Hyphema, right eye: Secondary | ICD-10-CM | POA: Insufficient documentation

## 2020-02-03 DIAGNOSIS — H401111 Primary open-angle glaucoma, right eye, mild stage: Secondary | ICD-10-CM | POA: Insufficient documentation

## 2020-02-03 DIAGNOSIS — Z8546 Personal history of malignant neoplasm of prostate: Secondary | ICD-10-CM | POA: Insufficient documentation

## 2020-02-03 DIAGNOSIS — G4730 Sleep apnea, unspecified: Secondary | ICD-10-CM | POA: Insufficient documentation

## 2020-02-03 DIAGNOSIS — E7800 Pure hypercholesterolemia, unspecified: Secondary | ICD-10-CM | POA: Insufficient documentation

## 2020-02-03 DIAGNOSIS — E039 Hypothyroidism, unspecified: Secondary | ICD-10-CM | POA: Diagnosis not present

## 2020-02-03 DIAGNOSIS — H2511 Age-related nuclear cataract, right eye: Secondary | ICD-10-CM | POA: Diagnosis present

## 2020-02-03 DIAGNOSIS — Z885 Allergy status to narcotic agent status: Secondary | ICD-10-CM | POA: Diagnosis not present

## 2020-02-03 DIAGNOSIS — Z87891 Personal history of nicotine dependence: Secondary | ICD-10-CM | POA: Insufficient documentation

## 2020-02-03 DIAGNOSIS — I10 Essential (primary) hypertension: Secondary | ICD-10-CM | POA: Diagnosis not present

## 2020-02-03 DIAGNOSIS — G8929 Other chronic pain: Secondary | ICD-10-CM | POA: Insufficient documentation

## 2020-02-03 DIAGNOSIS — Z981 Arthrodesis status: Secondary | ICD-10-CM | POA: Diagnosis not present

## 2020-02-03 DIAGNOSIS — G7000 Myasthenia gravis without (acute) exacerbation: Secondary | ICD-10-CM | POA: Diagnosis not present

## 2020-02-03 DIAGNOSIS — J449 Chronic obstructive pulmonary disease, unspecified: Secondary | ICD-10-CM | POA: Diagnosis not present

## 2020-02-03 DIAGNOSIS — M549 Dorsalgia, unspecified: Secondary | ICD-10-CM | POA: Diagnosis not present

## 2020-02-03 HISTORY — PX: CATARACT EXTRACTION W/PHACO: SHX586

## 2020-02-03 SURGERY — PHACOEMULSIFICATION, CATARACT, WITH IOL INSERTION
Anesthesia: Monitor Anesthesia Care | Site: Eye | Laterality: Right

## 2020-02-03 MED ORDER — SODIUM HYALURONATE 23 MG/ML IO SOLN
INTRAOCULAR | Status: DC | PRN
  Administered 2020-02-03: 0.6 mL via INTRAOCULAR

## 2020-02-03 MED ORDER — ONDANSETRON HCL 4 MG/2ML IJ SOLN: 4.0000 mg | Freq: Once | INTRAMUSCULAR | Status: DC | PRN

## 2020-02-03 MED ORDER — LIDOCAINE HCL (PF) 2 % IJ SOLN
INTRAOCULAR | Status: DC | PRN
  Administered 2020-02-03: 1 mL via INTRAOCULAR

## 2020-02-03 MED ORDER — MOXIFLOXACIN HCL 0.5 % OP SOLN
OPHTHALMIC | Status: DC | PRN
  Administered 2020-02-03: 0.2 mL via OPHTHALMIC

## 2020-02-03 MED ORDER — ACETAMINOPHEN 325 MG PO TABS: 325.0000 mg | ORAL_TABLET | ORAL | Status: DC | PRN

## 2020-02-03 MED ORDER — ARMC OPHTHALMIC DILATING DROPS
1.0000 "application " | OPHTHALMIC | Status: DC | PRN
  Administered 2020-02-03 (×3): 1 via OPHTHALMIC

## 2020-02-03 MED ORDER — TETRACAINE HCL 0.5 % OP SOLN
1.0000 [drp] | OPHTHALMIC | Status: DC | PRN
  Administered 2020-02-03 (×3): 1 [drp] via OPHTHALMIC

## 2020-02-03 MED ORDER — EPINEPHRINE PF 1 MG/ML IJ SOLN
INTRAOCULAR | Status: DC | PRN
  Administered 2020-02-03: 76 mL via OPHTHALMIC

## 2020-02-03 MED ORDER — MIDAZOLAM HCL 2 MG/2ML IJ SOLN
INTRAMUSCULAR | Status: DC | PRN
  Administered 2020-02-03: 1 mg via INTRAVENOUS

## 2020-02-03 MED ORDER — SODIUM HYALURONATE 10 MG/ML IO SOLN
INTRAOCULAR | Status: DC | PRN
  Administered 2020-02-03: 0.55 mL via INTRAOCULAR

## 2020-02-03 MED ORDER — FENTANYL CITRATE (PF) 100 MCG/2ML IJ SOLN
INTRAMUSCULAR | Status: DC | PRN
  Administered 2020-02-03: 50 ug via INTRAVENOUS

## 2020-02-03 MED ORDER — ACETAMINOPHEN 160 MG/5ML PO SOLN: 325.0000 mg | ORAL | Status: DC | PRN

## 2020-02-03 SURGICAL SUPPLY — 22 items
CANNULA ANT/CHMB 27G (MISCELLANEOUS) ×2 IMPLANT
CANNULA ANT/CHMB 27GA (MISCELLANEOUS) ×4 IMPLANT
DEVICE INJECT ISTENT W (Stent) IMPLANT
DISSECTOR HYDRO NUCLEUS 50X22 (MISCELLANEOUS) ×2 IMPLANT
GLOVE SURG LX 7.5 STRW (GLOVE) ×1
GLOVE SURG LX STRL 7.5 STRW (GLOVE) ×1 IMPLANT
GLOVE SURG SYN 8.5  E (GLOVE) ×2
GLOVE SURG SYN 8.5 E (GLOVE) ×2 IMPLANT
GLOVE SURG SYN 8.5 PF PI (GLOVE) ×1 IMPLANT
GOWN STRL REUS W/ TWL LRG LVL3 (GOWN DISPOSABLE) ×2 IMPLANT
GOWN STRL REUS W/TWL LRG LVL3 (GOWN DISPOSABLE) ×2
INJECT ISTENT W (Stent) ×4 IMPLANT
LENS IOL DIOP 22.5 (Intraocular Lens) ×2 IMPLANT
LENS IOL TECNIS MONO 22.5 (Intraocular Lens) IMPLANT
MARKER SKIN DUAL TIP RULER LAB (MISCELLANEOUS) ×2 IMPLANT
PACK DR. KING ARMS (PACKS) ×2 IMPLANT
PACK EYE AFTER SURG (MISCELLANEOUS) ×2 IMPLANT
PACK OPTHALMIC (MISCELLANEOUS) ×2 IMPLANT
SYR 3ML LL SCALE MARK (SYRINGE) ×2 IMPLANT
SYR TB 1ML LUER SLIP (SYRINGE) ×2 IMPLANT
WATER STERILE IRR 250ML POUR (IV SOLUTION) ×2 IMPLANT
WIPE NON LINTING 3.25X3.25 (MISCELLANEOUS) ×2 IMPLANT

## 2020-02-03 NOTE — Anesthesia Procedure Notes (Signed)
Procedure Name: MAC Performed by: Toneisha Savary, Zebadiah, CRNA Pre-anesthesia Checklist: Patient identified, Emergency Drugs available, Suction available, Timeout performed and Patient being monitored Patient Re-evaluated:Patient Re-evaluated prior to induction Oxygen Delivery Method: Nasal cannula Placement Confirmation: positive ETCO2       

## 2020-02-03 NOTE — H&P (Signed)
The History and Physical notes are on paper, have been signed, and are to be scanned.   I have examined the patient and there are no changes to the H&P.   Attestation: 1. The patient's impairment of visual function is believed not to be correctable with a tolerable change in glasses or contact lenses. 2. Cataract (in the operative eye) is believed to be significantly contributing to the patient's visual impairment. 3. The patient desires surgical correction; the risks, benefits and alternatives have been explained; and a reasonable expectation exists that lens surgery will significantly improve both the visual and functional status of the patient. 4. The patient has mild open angle glaucoma and will benefit from a trabecular bypass procedure.  Plan is for istent inject.  I certify the statements are true to the best of my knowledge.  Benay Pillow 02/03/2020 9:03 AM

## 2020-02-03 NOTE — Anesthesia Preprocedure Evaluation (Signed)
Anesthesia Evaluation  Patient identified by MRN, date of birth, ID band Patient awake    Reviewed: Allergy & Precautions, H&P , NPO status , Patient's Chart, lab work & pertinent test results, reviewed documented beta blocker date and time   Airway Mallampati: II  TM Distance: >3 FB Neck ROM: full    Dental no notable dental hx.    Pulmonary sleep apnea , COPD, former smoker,    Pulmonary exam normal breath sounds clear to auscultation       Cardiovascular Exercise Tolerance: Good hypertension,  Rhythm:regular Rate:Normal     Neuro/Psych  Neuromuscular disease (myasthenia gravis) negative psych ROS   GI/Hepatic negative GI ROS, Neg liver ROS,   Endo/Other  Hypothyroidism   Renal/GU negative Renal ROS  negative genitourinary   Musculoskeletal   Abdominal   Peds  Hematology negative hematology ROS (+)   Anesthesia Other Findings   Reproductive/Obstetrics negative OB ROS                             Anesthesia Physical Anesthesia Plan  ASA: III  Anesthesia Plan: MAC   Post-op Pain Management:    Induction:   PONV Risk Score and Plan: 1 and Treatment may vary due to age or medical condition  Airway Management Planned:   Additional Equipment:   Intra-op Plan:   Post-operative Plan:   Informed Consent: I have reviewed the patients History and Physical, chart, labs and discussed the procedure including the risks, benefits and alternatives for the proposed anesthesia with the patient or authorized representative who has indicated his/her understanding and acceptance.     Dental Advisory Given  Plan Discussed with: CRNA  Anesthesia Plan Comments:         Anesthesia Quick Evaluation

## 2020-02-03 NOTE — Op Note (Signed)
OPERATIVE NOTE  Francisco [name] [id number] 02/03/2020  PREOPERATIVE DIAGNOSIS:   1.  Mild PRIMARY open angle glaucoma, right eye.  2.  Nuclear sclerotic cataract right eye.  H25.11   POSTOPERATIVE DIAGNOSIS:    same.   PROCEDURE:   1.  Placement of trabecular bypass stent (istent). CPT 0191T  and placement of additional stent  CPT 0376T 2.  Phacoemusification with posterior chamber intraocular lens placement of the right eye  CPT [phone]   LENS: Implant Name Type Inv. Item Serial No. Manufacturer Lot No. LRB No. Used Action  LENS IOL DIOP 22.5 - [id number] Intraocular Lens LENS IOL DIOP 22.5 [id number] AMO  Right 1 Implanted  Marko Stai - [id number] Stent INJECT [employer] [id number] Right 1 Implanted  Marko Stai - [id number] Stent INJECT [employer] [id number] Right 1 Implanted      Procedure(s) with comments: CATARACT EXTRACTION PHACO AND INTRAOCULAR LENS PLACEMENT (IOC) RIGHT ISTENT INJ (Right) - 4.29 0:35.6  DCB00 +22.5   ULTRASOUND TIME: 0 minutes 35 seconds.  CDE 4.29   SURGEON:  Benay Pillow, MD, MPH  ANESTHESIOLOGIST: Anesthesiologist: Alisa Graff, MD CRNA: Mayme Genta, CRNA   ANESTHESIA:  MAC and intracameral preservative-free lidocaine 4%.  ESTIMATED BLOOD LOSS: less than 1 mL.   COMPLICATIONS:  None.   DESCRIPTION OF PROCEDURE:  The patient was identified in the holding room and transported to the operating room.   The patient was placed in the supine position under the operating microscope.  The right eye was prepped and draped in the usual sterile ophthalmic fashion.   A 1.0 millimeter clear-corneal paracentesis was made at the 10:30 position. 0.5 ml of preservative-free 1% lidocaine with epinephrine was injected into the anterior chamber.  The anterior chamber was filled with Healon 5 viscoelastic.  A 2.4 millimeter keratome was used to make a near-clear corneal incision at the 8:00 position.   Attention was  turned to the istent.  The patients head was turned to the left and the microscope was tilted to 035 degrees.  Ocular instruments/[employer] OAL/H2 gonioprism was used with IPC05 (iclip) coupled with Healon 5 on the cornea was used to visualize the trabecular meshwork. The istent was opened and introduced into the eye.  The meshwork was engaged with the tip of the iStent injector and the stent was deployed into Schlemm's canal at 2:00.  The second stent was deployed at 4:00, but was not well seated.  The stent was rethreaded onto the injector and additional attempts were made, but unsuccessful.  A second istent injector was introduced and successfully injected without difficulty.   There was good mild blood reflux from both istents. Both istents were well seated and in good position.  Next, attention was turned to the phacoemulsification A curvilinear capsulorrhexis was made with a cystotome and capsulorrhexis forceps.  Balanced salt solution was used to hydrodissect and hydrodelineate the nucleus.   Phacoemulsification was then used in stop and chop fashion to remove the lens nucleus and epinucleus.  The remaining cortex was then removed using the irrigation and aspiration handpiece. Healon was then placed into the capsular bag to distend it for lens placement.  A lens was then injected into the capsular bag.  The remaining viscoelastic was aspirated.   Wounds were hydrated with balanced salt solution.  The anterior chamber was inflated to a physiologic pressure with balanced salt solution.   Intracameral vigamox 0.1 mL undiluted was injected into the eye and  a drop placed onto the ocular surface.  The chamber was mildly shallow and there was a microhyphema.  A small amount of regular Healon was injected into the anterior chamber centrally and additional BSS was used to reform the chamber.  No wound leaks were noted. The patient was taken to the recovery room in stable condition without complications  of anesthesia or surgery   Benay Pillow 02/03/2020, 9:46 AM

## 2020-02-03 NOTE — Transfer of Care (Signed)
Immediate Anesthesia Transfer of Care Note  Patient: Francisco [name]  Procedure(s) Performed: CATARACT EXTRACTION PHACO AND INTRAOCULAR LENS PLACEMENT (IOC) RIGHT ISTENT INJ (Right Eye)  Patient Location: PACU  Anesthesia Type: MAC  Level of Consciousness: awake, alert  and patient cooperative  Airway and Oxygen Therapy: Patient Spontanous Breathing and Patient connected to supplemental oxygen  Post-op Assessment: Post-op Vital signs reviewed, Patient's Cardiovascular Status Stable, Respiratory Function Stable, Patent Airway and No signs of Nausea or vomiting  Post-op Vital Signs: Reviewed and stable  Complications: No apparent anesthesia complications

## 2020-02-03 NOTE — Anesthesia Postprocedure Evaluation (Signed)
Anesthesia Post Note  Patient: Francisco [name]  Procedure(s) Performed: CATARACT EXTRACTION PHACO AND INTRAOCULAR LENS PLACEMENT (IOC) RIGHT ISTENT INJ (Right Eye)     Patient location during evaluation: PACU Anesthesia Type: MAC Level of consciousness: awake and alert Pain management: pain level controlled Vital Signs Assessment: post-procedure vital signs reviewed and stable Respiratory status: spontaneous breathing, nonlabored ventilation, respiratory function stable and patient connected to nasal cannula oxygen Cardiovascular status: stable and blood pressure returned to baseline Postop Assessment: no apparent nausea or vomiting Anesthetic complications: no    Alisa Graff

## 2020-02-04 ENCOUNTER — Encounter: Payer: Self-pay | Admitting: *Deleted

## 2020-03-25 ENCOUNTER — Ambulatory Visit: Payer: Medicare Other | Attending: Internal Medicine | Admitting: Occupational Therapy

## 2020-03-25 ENCOUNTER — Encounter: Payer: Self-pay | Admitting: Occupational Therapy

## 2020-03-25 ENCOUNTER — Other Ambulatory Visit: Payer: Self-pay

## 2020-03-25 DIAGNOSIS — M6281 Muscle weakness (generalized): Secondary | ICD-10-CM | POA: Insufficient documentation

## 2020-03-25 DIAGNOSIS — M25632 Stiffness of left wrist, not elsewhere classified: Secondary | ICD-10-CM

## 2020-03-25 DIAGNOSIS — M25631 Stiffness of right wrist, not elsewhere classified: Secondary | ICD-10-CM | POA: Diagnosis present

## 2020-03-25 DIAGNOSIS — M25532 Pain in left wrist: Secondary | ICD-10-CM | POA: Diagnosis present

## 2020-03-25 DIAGNOSIS — L905 Scar conditions and fibrosis of skin: Secondary | ICD-10-CM | POA: Diagnosis present

## 2020-03-25 DIAGNOSIS — M25531 Pain in right wrist: Secondary | ICD-10-CM | POA: Insufficient documentation

## 2020-03-25 NOTE — Therapy (Signed)
[facility] [address] [place], [address] Phone: [phone]   Fax:  [phone]  Occupational Therapy Evaluation  Patient Details  Name: Francisco [name] MRN: [id number] Date of Birth: 1945/09/29 Referring Provider (OT): Caryn Section   Encounter Date: 03/25/2020   OT End of Session - 03/27/20 1456    Visit Number 1    Number of Visits 12    Date for OT Re-Evaluation 05/08/20    OT Start Time 1300    OT Stop Time 1358    OT Time Calculation (min) 58 min    Activity Tolerance Patient tolerated treatment well    Behavior During Therapy [facility] for tasks assessed/performed           Past Medical History:  Diagnosis Date  . Arthritis    lower left hip  . Bilateral hand numbness    from back surgery  . Bronchitis, chronic ([place])   . Cancer [facility])    Prostate cancr 02/2013; Merkel cell cancer, and Basal cell cancer (twice; 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  . 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 Hypercholesterolemia   . Hypertension    doesn't have a cardiologist;pt is maintained by medical md for htn;requ[facility] e[facility]  . Hypothyroidism    pt takes Levothyroxine daily  . Myasthenia gravis, adult form ([place])   . Shortness of breath    Lung MD- Dr Darlin Coco  . Sleep apnea    do not use CPAP every night    Past Surgical History:  Procedure Laterality Date  . ANTERIOR CERVICAL DECOMP/DISCECTOMY FUSION  07/18/2011   Procedure: ANTERIOR CERVICAL DECOMPRESSION/DISCECTOMY FUSION 2 LEVELS;  Surgeon: Cooper Render Pool;  Location: [place] [facility];  Service: Neurosurgery;  Laterality: N/A;  cervical five-six, cervical six-seven anterior cervical discectomy and fusion  . BACK SURGERY     in [place]     2005 at [facility], no stents  . CATARACT EXTRACTION W/PHACO Left 01/06/2020   Procedure: CATARACT EXTRACTION PHACO AND INTRAOCULAR LENS PLACEMENT (IOC) ISTENT  INJ LEFT 3.81  00:33.3;  Surgeon: Eulogio Bear, MD;  Location: [facility];  Service: Ophthalmology;  Laterality: Left;  . CATARACT EXTRACTION W/PHACO Right 02/03/2020   Procedure: CATARACT EXTRACTION PHACO AND INTRAOCULAR LENS PLACEMENT ([place]) RIGHT ISTENT INJ;  Surgeon: Eulogio Bear, MD;  Location: [place];  Service: Ophthalmology;  Laterality: Right;  4.29 0:35.6  . HERNIA REPAIR Left    inguinal hernia repair in 1985  . LUMBAR LAMINECTOMY/DECOMPRESSION MICRODISCECTOMY Left 02/24/2014   Procedure: LUMBAR LAMINECTOMY/DECOMPRESSION MICRODISCECTOMY LUMBAR THREE-FOUR, FOUR-FIVE, LEFT FIVE-SACRAL ONE ;  Surgeon: Charlie Pitter, MD;  Location: [place] [facility];  Service: Neurosurgery;  Laterality: Left;  LUMBAR LAMINECTOMY/DECOMPRESSION MICRODISCECTOMY LUMBAR THREE-FOUR, FOUR-FIVE, LEFT FIVE-SACRAL ONE   . PROSTATECTOMY  8/14   [facility] Dr Mare Ferrari     There were no vitals filed for this visit.      [facility] OT Assessment - 03/27/20 1501      Assessment   Medical Diagnosis R CTR    Referring Provider (OT) Caryl Comes, B    Onset Date/Surgical Date 01/28/20      Home  Environment   Family/patient expects to be discharged to: Private residence    Living Arrangements Spouse/significant other    Available Help at Discharge Family    Type of [place]  Prior Function   Level of Independence Independent    Vocation Full time employment    [employer], computer use and phone use    Leisure decorate yard for holidays      ADL   ADL comments Independent with basic self care but difficulty with buttons.  Has someone come and clean every 2 weeks.  Has someone to come and do yardwork.         Observation/Other Assessments   Focus on Therapeutic Outcomes (FOTO)  60      Sensation   Light Touch Appears Intact    Hot/Cold Appears Intact    Additional Comments numbness on right hand after surgery.  Right hand unable to detect Semmes  Weinstein 3.61, 4.31 on dorsal and volar surfaces.  Left hand dorsum 3.61, volar 4.31      AROM   Right Forearm Pronation 80 Degrees    Right Forearm Supination 55 Degrees    Left Forearm Pronation 90 Degrees    Left Forearm Supination 95 Degrees    Right Wrist Flexion 58 Degrees    Right Wrist Radial Deviation 42 Degrees      Strength   Right Hand Grip (lbs) 45    Left Hand Grip (lbs) 65          [employer]   Access Code [email or web address]  Patient issued and instructed on edema control measures with use of contrast.  Instructed on tendon gliding exercises for hand ROM for home program Instructed on scar massage to right wrist for home program              OT Education - 03/27/20 1455    Education Details role of OT, plan of care, goals, contrast, tendon gliding ex    Person(s) Educated Patient    Methods Explanation;Demonstration    Comprehension Verbalized understanding;Returned demonstration               OT Long Term Goals - 03/27/20 1502      OT LONG TERM GOAL #1   Title Patient to improve FOTO score by 10 points to be able to carry a bag in his right hand.    Baseline difficulty to carry items in right hand on eval.    Time 6    Period Weeks    Status New    Target Date 05/08/20      OT LONG TERM GOAL #2   Title Patient will be independent with use of contrast for edema control measures.    Baseline no knowledge at eval    Time 3    Period Weeks    Status New    Target Date 04/17/20      OT LONG TERM GOAL #3   Title Patient will improve supination by 10 degrees to assist with holding and carrying objects.    Baseline 55 degrees on right at eval    Time 6    Period Weeks    Status New    Target Date 05/08/20      OT LONG TERM GOAL #4   Title Patient will improve grip strength in right hand by 10# to carry items from car to house.    Baseline difficulty at eval    Time 6    Period Weeks    Status New    Target Date 05/08/20      OT LONG TERM  GOAL #5   Title Patient will improve right hand function to be able to manage  buttons with modified independence and greater ease    Baseline difficulty at eval and occasionally requires assist.    Time 6    Period Weeks    Status New    Target Date 05/08/20                 Plan - 03/27/20 1457    Clinical Impression Statement Pt is a 74 yo male s/p carpal tunnel release on 01/28/2020 on right and awaiting surgery for left CTR.  Patient presents with pain occasionally in the right, present in the left, decreased grip on right, decreased supination of forearm, decreased ability to perform self care and IADL tasks.  Patient would benefit from skilled OT services to maximize safety and independence in necessary daily tasks.    OT Occupational Profile and History Detailed Assessment- Review of Records and additional review of physical, cognitive, psychosocial history related to current functional performance    Occupational performance deficits (Please refer to evaluation for details): ADL's;IADL's;Leisure;Work    Marketing executive / Function / Physical Skills ADL;Flexibility;ROM;UE functional use;FMC;Scar mobility;Dexterity;Sensation;Edema;Pain;Strength;IADL;Coordination    Psychosocial Skills Environmental  Adaptations;Habits;Routines and Behaviors    Rehab Potential Good    Clinical Decision Making Limited treatment options, no task modification necessary    Comorbidities Affecting Occupational Performance: May have comorbidities impacting occupational performance    Modification or Assistance to Complete Evaluation  No modification of tasks or assist necessary to complete eval    OT Frequency 2x / week    OT Duration 6 weeks    OT Treatment/Interventions Self-care/ADL training;Therapeutic exercise;Moist Heat;Paraffin;Neuromuscular education;Splinting;Patient/family education;Fluidtherapy;Scar mobilization;Therapeutic activities;Cryotherapy;Ultrasound;Contrast Bath;Manual Therapy     Consulted and Agree with Plan of Care Patient           Patient will benefit from skilled therapeutic intervention in order to improve the following deficits and impairments:   Body Structure / Function / Physical Skills: ADL, Flexibility, ROM, UE functional use, FMC, Scar mobility, Dexterity, Sensation, Edema, Pain, Strength, IADL, Coordination   Psychosocial Skills: Environmental  Adaptations, Habits, Routines and Behaviors   Visit Diagnosis: Stiffness of right wrist joint  Stiffness of left wrist, not elsewhere classified  Muscle weakness (generalized)  Pain in right wrist  Pain in left wrist    Problem List Patient Active Problem List   Diagnosis Date Noted  . PAD (peripheral artery disease) ([place]) 06/06/2019  . CKD (chronic kidney disease) stage 3, GFR 30-59 ml/min 04/29/2019  . B12 deficiency 01/23/2019  . Left arm numbness 02/28/2018  . Neck pain 02/28/2018  . Anemia 02/02/2017  . DDD (degenerative disc disease), cervical 02/02/2017  . Hypothyroid 02/02/2017  . MRSA (methicillin resistant staph aureus) culture positive 02/02/2017  . Nocturnal hypoxia 02/02/2017  . Senile purpura ([place]) 10/03/2016  . Essential hypertension, benign 09/16/2016  . Bilateral carotid artery disease ([place]) 09/16/2016  . 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/18/2016  . 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 History of prostate cancer 12/21/2015  . Left carpal tunnel syndrome 11/11/2015  . Kidney stone on left side 07/05/2015  . Myasthenia gravis ([place]) 05/26/2015  . Long-term use of high-risk medication 10/15/2014  . Persistent cough 09/10/2014  . Pure hypercholesterolemia 07/25/2014  . Spinal stenosis, lumbar region, with neurogenic claudication 02/24/2014  . Lumbosacral stenosis with neurogenic claudication ([place]) 02/24/2014  . COPD (chronic obstructive pulmonary disease) ([place]) 01/22/2014   Generoso Cropper T Tienna Bienkowski, OTR/L, CLT  Jeston Junkins 03/27/2020, 3:10 PM  [facility] [address]  [place], [place], [address] Phone: [phone]   Fax:  [phone]  Name: Francisco [name] MRN: [id number] Date of Birth: 1946/04/20

## 2020-03-30 ENCOUNTER — Ambulatory Visit: Payer: Medicare Other | Admitting: Occupational Therapy

## 2020-03-31 ENCOUNTER — Ambulatory Visit: Payer: Medicare Other | Admitting: Occupational Therapy

## 2020-03-31 ENCOUNTER — Other Ambulatory Visit: Payer: Self-pay

## 2020-03-31 DIAGNOSIS — L905 Scar conditions and fibrosis of skin: Secondary | ICD-10-CM

## 2020-03-31 DIAGNOSIS — M25631 Stiffness of right wrist, not elsewhere classified: Secondary | ICD-10-CM

## 2020-03-31 DIAGNOSIS — M6281 Muscle weakness (generalized): Secondary | ICD-10-CM

## 2020-03-31 DIAGNOSIS — M25531 Pain in right wrist: Secondary | ICD-10-CM
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
## 2020-03-31 NOTE — Patient Instructions (Signed)
See note

## 2020-04-02 ENCOUNTER — Ambulatory Visit: Payer: Medicare Other | Admitting: Occupational Therapy

## 2020-04-02 ENCOUNTER — Encounter: Payer: Medicare Other | Admitting: Occupational Therapy

## 2020-04-02 ENCOUNTER — Encounter: Payer: Self-pay | Admitting: Occupational Therapy

## 2020-04-02 ENCOUNTER — Other Ambulatory Visit: Payer: Self-pay

## 2020-04-02 DIAGNOSIS — L905 Scar conditions and fibrosis of skin: Secondary | ICD-10-CM

## 2020-04-02 DIAGNOSIS — M6281 Muscle weakness (generalized): Secondary | ICD-10-CM

## 2020-04-02 DIAGNOSIS — M25631 Stiffness of right wrist, not elsewhere classified: Secondary | ICD-10-CM | POA: Diagnosis not present

## 2020-04-02 DIAGNOSIS — M25531 Pain in right wrist: Secondary | ICD-10-CM

## 2020-04-02 DIAGNOSIS — M25632 Stiffness of left wrist, not elsewhere classified: Secondary | ICD-10-CM

## 2020-04-02 DIAGNOSIS — M25532 Pain in left wrist: Secondary | ICD-10-CM

## 2020-04-02 NOTE — Therapy (Signed)
[place] [facility] [address], [place], [address] Phone: [phone]   Fax:  [phone]  Occupational Therapy Treatment  Patient Details  Name: Francisco [name] MRN: [id number] Date of Birth: June 15, 1946 Referring Provider (OT): Caryn Section   Encounter Date: 04/02/2020   OT End of Session - 04/02/20 1538    Visit Number 3    Number of Visits 12    Date for OT Re-Evaluation 05/08/20    OT Start Time 1430    OT Stop Time 1516    OT Time Calculation (min) 46 min    Activity Tolerance Patient tolerated treatment well    Behavior During Therapy [facility] for tasks assessed/performed           Past Medical History:  Diagnosis Date  . Arthritis    lower left hip  . Bilateral hand numbness    from back surgery  . Bronchitis, chronic ([place])   . Cancer [facility])    Prostate cancr 02/2013; Merkel cell cancer, and Basal cell cancer (twice; 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  . 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 Hypercholesterolemia   . Hypertension    doesn't have a cardiologist;pt is maintained by medical md for htn;requested ekg/cxr from [facility]  . Hypothyroidism    pt takes Levothyroxine daily  . Myasthenia gravis, adult form ([place])   . Shortness of breath    Lung MD- Dr Darlin Coco  . Sleep apnea    do not use CPAP every night    Past Surgical History:  Procedure Laterality Date  . ANTERIOR CERVICAL DECOMP/DISCECTOMY FUSION  07/18/2011   Procedure: ANTERIOR CERVICAL DECOMPRESSION/DISCECTOMY FUSION 2 LEVELS;  Surgeon: Cooper Render Pool;  Location: [place] NEURO ORS;  Service: Neurosurgery;  Laterality: N/A;  cervical five-six, cervical six-seven anterior cervical discectomy and fusion  . BACK SURGERY     in [place]     2005 at [facility], no stents  . CATARACT EXTRACTION W/PHACO Left 01/06/2020   Procedure: CATARACT EXTRACTION PHACO AND INTRAOCULAR LENS PLACEMENT (IOC) ISTENT INJ  LEFT 3.81  00:33.3;  Surgeon: Eulogio Bear, MD;  Location: [place];  Service: Ophthalmology;  Laterality: Left;  . CATARACT EXTRACTION W/PHACO Right 02/03/2020   Procedure: CATARACT EXTRACTION PHACO AND INTRAOCULAR LENS PLACEMENT ([place]) RIGHT ISTENT INJ;  Surgeon: Eulogio Bear, MD;  Location: [place];  Service: Ophthalmology;  Laterality: Right;  4.29 0:35.6  . HERNIA REPAIR Left    inguinal hernia repair in 1985  . LUMBAR LAMINECTOMY/DECOMPRESSION MICRODISCECTOMY Left 02/24/2014   Procedure: LUMBAR LAMINECTOMY/DECOMPRESSION MICRODISCECTOMY LUMBAR THREE-FOUR, FOUR-FIVE, LEFT FIVE-SACRAL ONE ;  Surgeon: Charlie Pitter, MD;  Location: [name] [facility]UR[facility]S;  Service: Neurosurgery;  Laterality: Left;  LUMBAR LAMINECTOMY/DECOMPRESSION MICRODISCECTOMY LUMBAR THREE-FOUR, FOUR-FIVE, LEFT FIVE-SACRAL ONE   . PROSTATECTOMY  8/14   [facility] Dr Mare Ferrari     There were no vitals filed for this visit.   Subjective Assessment - 04/02/20 1442    Subjective  Patient reports he went to the doctor the other day and plans to have surgery in 4 weeks.    Pertinent History Patient is s/p carpal tunnel release right hand on 01/28/2020, Dr. Trenton Gammon.  Pt was supposed to have surgery on his left hand however he delayed the surgery now secondary to the progress with his left hand.    Patient Stated Goals Pt would like to get his strength back  in the hand and remain pain free.    Currently in Pain? No/denies    Pain Score 0-No pain          Paraffin to right hand and wrist for 10 mins prior to manual therapy skills   Reassessed AROM for L wrist and grip strength  See flowsheet for details    [facility] OT Assessment - 04/02/20 1603      Strength   Right Hand Grip (lbs) 54    Right Hand Lateral Pinch 15 lbs    Right Hand 3 Point Pinch 18 lbs    Left Hand Grip (lbs) 72    Left Hand Lateral Pinch 18 lbs    Left Hand 3 Point Pinch 16 lbs         Scar massage performed by OT, manual carpal  spreads  Vibration performed with use of mini massager along scarline and surrounding area graston tool nr 2 sweeping over volar wrist and forearm, light sweeping since patient bruises easily Prior to stretch for wrist   Tendon gliding exercises on right with blocking of  wrist from collapsing into flexion, 10 reps   Median nerve glide - 5 reps as outlined per last session, did not perform the last 2 steps due to limitation in supination motion which has been limited since childhood per patient report.             OT Treatments/Exercises (OP) - 04/02/20 1602      RUE Paraffin   Number Minutes Paraffin 10 Minutes    RUE Paraffin Location Hand    Comments prior to manual therapy skills                  OT Education - 04/02/20 1538    Education Details HEP    Person(s) Educated Patient    Methods Explanation;Demonstration    Comprehension Verbalized understanding;Returned demonstration               OT Long Term Goals - 03/27/20 1502      OT LONG TERM GOAL #1   Title Patient to improve FOTO score by 10 points to be able to carry a bag in his right hand.    Baseline difficulty to carry items in right hand on eval.    Time 6    Period Weeks    Status New    Target Date 05/08/20      OT LONG TERM GOAL #2   Title Patient will be independent with use of contrast for edema control measures.    Baseline no knowledge at eval    Time 3    Period Weeks    Status New    Target Date 04/17/20      OT LONG TERM GOAL #3   Title Patient will improve supination by 10 degrees to assist with holding and carrying objects.    Baseline 55 degrees on right at eval    Time 6    Period Weeks    Status New    Target Date 05/08/20      OT LONG TERM GOAL #4   Title Patient will improve grip strength in right hand by 10# to carry items from car to house.    Baseline difficulty at eval    Time 6    Period Weeks    Status New    Target Date 05/08/20      OT LONG TERM  GOAL #5   Title Patient will improve right  hand function to be able to manage buttons with modified independence and greater ease    Baseline difficulty at eval and occasionally requires assist.    Time 6    Period Weeks    Status New    Target Date 05/08/20                 Plan - 04/02/20 1538    Clinical Impression Statement Patient 8 weeks post CTR on right, patient reports improvements noted in pain, scar and motion.  Sensation slightly improved and patient is aware this will take time for the nerve healing to be complete.  Patient continuing with home program with use of contrast for edema, ROM, nerve glides, scar management.  Patient demonstrated improvement in right grip strength by 7#, now at 54#.  Patient indicating he will have surgery on his left wrist in 4 weeks.  Continue to work towards goals in plan of care to improve functional use for daily tasks.    OT Occupational Profile and History Detailed Assessment- Review of Records and additional review of physical, cognitive, psychosocial history related to current functional performance    Occupational performance deficits (Please refer to evaluation for details): ADL's;IADL's;Leisure;Work    Marketing executive / Function / Physical Skills ADL;Flexibility;ROM;UE functional use;FMC;Scar mobility;Dexterity;Sensation;Edema;Pain;Strength;IADL;Coordination    Psychosocial Skills Environmental  Adaptations;Habits;Routines and Behaviors    Rehab Potential Good    Clinical Decision Making Limited treatment options, no task modification necessary    Comorbidities Affecting Occupational Performance: May have comorbidities impacting occupational performance    Modification or Assistance to Complete Evaluation  No modification of tasks or assist necessary to complete eval    OT Frequency 2x / week    OT Duration 6 weeks    OT Treatment/Interventions Self-care/ADL training;Therapeutic exercise;Moist Heat;Paraffin;Neuromuscular  education;Splinting;Patient/family education;Fluidtherapy;Scar mobilization;Therapeutic activities;Cryotherapy;Ultrasound;Contrast Bath;Manual Therapy    Consulted and Agree with Plan of Care Patient           Patient will benefit from skilled therapeutic intervention in order to improve the following deficits and impairments:   Body Structure / Function / Physical Skills: ADL, Flexibility, ROM, UE functional use, FMC, Scar mobility, Dexterity, Sensation, Edema, Pain, Strength, IADL, Coordination   Psychosocial Skills: Environmental  Adaptations, Habits, Routines and Behaviors   Visit Diagnosis: Muscle weakness (generalized)  Pain in right wrist  Stiffness of right wrist joint  Scar tissue  Stiffness of left wrist, not elsewhere classified  Pain in left wrist    Problem List Patient Active Problem List   Diagnosis Date Noted  . PAD (peripheral artery disease) ([place]) 06/06/2019  . CKD (chronic kidney disease) stage 3, GFR 30-59 ml/min 04/29/2019  . B12 deficiency 01/23/2019  . Left arm numbness 02/28/2018  . Neck pain 02/28/2018  . Anemia 02/02/2017  . DDD (degenerative disc disease), cervical 02/02/2017  . Hypothyroid 02/02/2017  . MRSA (methicillin resistant staph aureus) culture positive 02/02/2017  . Nocturnal hypoxia 02/02/2017  . Senile purpura ([place]) 10/03/2016  . Essential hypertension, benign 09/16/2016  . Bilateral carotid artery disease ([place]) 09/16/2016  . 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/18/2016  . 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 History of prostate cancer 12/21/2015  . Left carpal tunnel syndrome 11/11/2015  . Kidney stone on left side 07/05/2015  . Myasthenia gravis ([place]) 05/26/2015  . Long-term use of high-risk medication 10/15/2014  . Persistent cough 09/10/2014  . Pure hypercholesterolemia 07/25/2014  . Spinal stenosis, lumbar region, with neurogenic claudication 02/24/2014  . Lumbosacral stenosis with neurogenic claudication (  [place])  02/24/2014  . COPD (chronic obstructive pulmonary disease) ([place]) 01/22/2014   Jenella Craigie T Zeniyah Peaster, OTR/L, CLT  Obe Ahlers 04/02/2020, 4:08 PM  [place] [facility] [address] [place], [address] Phone: [phone]   Fax:  [phone]  Name: Francisco [name] MRN: [id number] Date of Birth: May 18, 1946

## 2020-04-07 ENCOUNTER — Ambulatory Visit: Payer: Medicare Other | Admitting: Occupational Therapy

## 2020-04-07 ENCOUNTER — Other Ambulatory Visit: Payer: Self-pay

## 2020-04-07 DIAGNOSIS — M25631 Stiffness of right wrist, not elsewhere classified: Secondary | ICD-10-CM | POA: Diagnosis not present

## 2020-04-07 DIAGNOSIS — M25531 Pain in right wrist: Secondary | ICD-10-CM

## 2020-04-07 DIAGNOSIS — L905 Scar conditions and fibrosis of skin: Secondary | ICD-10-CM

## 2020-04-07 DIAGNOSIS — M6281 Muscle weakness (generalized): Secondary | ICD-10-CM

## 2020-04-07 NOTE — Patient Instructions (Signed)
See note

## 2020-04-07 NOTE — Therapy (Signed)
[facility] [address], [place], [address] Phone: [phone]   Fax:  [phone]  Occupational Therapy Treatment  Patient Details  Name: Francisco [name] MRN: [id number] Date of Birth: [birth year] Referring Provider (OT): Caryn Section   Encounter Date: 04/07/2020   OT End of Session - 04/07/20 1155    Visit Number 4    Number of Visits 12    Date for OT Re-Evaluation 05/08/20    OT Start Time 1101    OT Stop Time 1141    OT Time Calculation (min) 40 min    Activity Tolerance Patient tolerated treatment well    Behavior During Therapy 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 2   Hypercholesterolemia    Hypertension    doesn't have a cardiologist;pt is maintained by medical md for htn;requested ekg[facility]cxr from [facility]   Hypothyroidism    pt takes Levothyroxine daily   Myasthenia gravis, adult form ([place])    Shortness of breath    Lung MD- Dr Darlin Coco   Sleep apnea    do not use CPAP every night    Past Surgical History:  Procedure Laterality Date   ANTERIOR CERVICAL DECOMP/DISCECTOMY FUSION  07/18/2011   Procedure: ANTERIOR CERVICAL DECOMPRESSION/DISCECTOMY FUSION 2 LEVELS;  Surgeon: Cooper Render Pool;  Location: [place] [facility];  Service: Neurosurgery;  Laterality: N/A;  cervical five-six, cervical six-seven anterior cervical discectomy and fusion   BACK SURGERY     in [place]     2005 at [facility], no stents   CATARACT EXTRACTION W/PHACO Left 01/06/2020   Procedure: CATARACT EXTRACTION PHACO AND INTRAOCULAR LENS PLACEMENT (IOC) ISTENT INJ  LEFT 3.81  00:33.3;  Surgeon: Eulogio Bear, MD;  Location: [place];  Service: Ophthalmology;  Laterality: Left;   CATARACT EXTRACTION W/PHACO Right 02/03/2020   Procedure: CATARACT EXTRACTION PHACO AND INTRAOCULAR LENS PLACEMENT ([place]) RIGHT ISTENT INJ;  Surgeon: Eulogio Bear, MD;  Location: [place];  Service: Ophthalmology;  Laterality: Right;  4.29 0:35.6   HERNIA REPAIR Left    inguinal hernia repair in 1985   LUMBAR LAMINECTOMY/DECOMPRESSION MICRODISCECTOMY Left 02/24/2014   Procedure: LUMBAR LAMINECTOMY/DECOMPRESSION MICRODISCECTOMY LUMBAR THREE-FOUR, FOUR-FIVE, LEFT FIVE-SACRAL ONE ;  Surgeon: Charlie Pitter, MD;  Location: [place] [facility];  Service: Neurosurgery;  Laterality: Left;  LUMBAR LAMINECTOMY/DECOMPRESSION MICRODISCECTOMY LUMBAR THREE-FOUR, FOUR-FIVE, LEFT FIVE-SACRAL ONE    PROSTATECTOMY  8/14   [facility] Dr Mare Ferrari     There were no vitals filed for this visit.   Subjective Assessment - 04/07/20 1153    Subjective  Look I cut my thumb just before I come to see you - it was bleeding - so I have this bandaid on - but I am doing better- sensation getting better    Pertinent History Patient is s/p carpal tunnel release right hand on 01/28/2020, Dr. Trenton Gammon.  Pt was supposed to have surgery on his left hand however he delayed the surgery now secondary to the progress with his left hand.  Patient Stated Goals Pt would like to get his strength back in the hand and remain pain free.    Currently in Pain? No/denies                 did not test grip or prehension -because of cut on thumb - band aid in place        OT Treatments/Exercises (OP) - 04/07/20 0001      RUE Paraffin   Number Minutes Paraffin 8 Minutes    RUE Paraffin Location Hand    Comments prior to scar massage and soft tissue          done only ulnar side of hand and scar - bandaid on thumb because of cut he got this am    Scar massage performed by OT, manual carpal  spreads  Vibration performed with use of mini massager along scarline and surrounding area graston tool nr 2 sweeping over volar wrist and forearm, light sweeping since patient bruises easily Prior to stretch for wrist extention  Tendon gliding exercises on right with blocking of  wrist from collapsing into flexion, 10 reps - needed min A    Median nerve glide - 5 reps as outlined in past , did not perform the last 2 steps due to limitation in supination motion which has been limited since childhood per patient report.          OT Education - 04/07/20 1155    Education Details progress    Person(s) Educated Patient    Methods Explanation;Demonstration    Comprehension Verbalized understanding;Returned demonstration               OT Long Term Goals - 03/27/20 1502      OT LONG TERM GOAL #1   Title Patient to improve FOTO score by 10 points to be able to carry a bag in his right hand.    Baseline difficulty to carry items in right hand on eval.    Time 6    Period Weeks    Status New    Target Date 05/08/20      OT LONG TERM GOAL #2   Title Patient will be independent with use of contrast for edema control measures.    Baseline no knowledge at eval    Time 3    Period Weeks    Status New    Target Date 04/17/20      OT LONG TERM GOAL #3   Title Patient will improve supination by 10 degrees to assist with holding and carrying objects.    Baseline 55 degrees on right at eval    Time 6    Period Weeks    Status New    Target Date 05/08/20      OT LONG TERM GOAL #4   Title Patient will improve grip strength in right hand by 10# to carry items from car to house.    Baseline difficulty at eval    Time 6    Period Weeks    Status New    Target Date 05/08/20      OT LONG TERM GOAL #5   Title Patient will improve right hand function to be able to manage buttons with modified independence and greater ease    Baseline difficulty at eval and occasionally requires  assist.    Time 6    Period Weeks    Status New    Target Date 05/08/20  Plan - 04/07/20 1156    Clinical Impression Statement Pt making progress in scar tissue , sensation and ROM/strength - did cut his thumb this am - but was able still to get ulnar side of hand and scar in paraffin prior to scarmassage- Semmes weinstein still decrease for 2.83 on distal to PIP on 2nd , 3rd and  MC distal to thumb    OT Occupational Profile and History Detailed Assessment- Review of Records and additional review of physical, cognitive, psychosocial history related to current functional performance    Occupational performance deficits (Please refer to evaluation for details): ADL's;IADL's;Leisure;Work    Marketing executive / Function / Physical Skills ADL;Flexibility;ROM;UE functional use;FMC;Scar mobility;Dexterity;Sensation;Edema;Pain;Strength;IADL;Coordination    Psychosocial Skills Environmental  Adaptations;Habits;Routines and Behaviors    Rehab Potential Good    Clinical Decision Making Limited treatment options, no task modification necessary    Comorbidities Affecting Occupational Performance: May have comorbidities impacting occupational performance    Modification or Assistance to Complete Evaluation  No modification of tasks or assist necessary to complete eval    OT Frequency 2x / week    OT Duration 6 weeks    OT Treatment/Interventions Self-care/ADL training;Therapeutic exercise;Moist Heat;Paraffin;Neuromuscular education;Splinting;Patient/family education;Fluidtherapy;Scar mobilization;Therapeutic activities;Cryotherapy;Ultrasound;Contrast Bath;Manual Therapy    Consulted and Agree with Plan of Care Patient           Patient will benefit from skilled therapeutic intervention in order to improve the following deficits and impairments:   Body Structure / Function / Physical Skills: ADL, Flexibility, ROM, UE functional use, FMC, Scar mobility, Dexterity, Sensation, Edema,  Pain, Strength, IADL, Coordination   Psychosocial Skills: Environmental  Adaptations, Habits, Routines and Behaviors   Visit Diagnosis: Muscle weakness (generalized)  Pain in right wrist  Stiffness of right wrist joint  Scar tissue    Problem List Patient Active Problem List   Diagnosis Date Noted   PAD (peripheral artery disease) ([place]) 06/06/2019   CKD (chronic kidney disease) stage 3, GFR 30-59 ml/min 04/29/2019   B12 deficiency 01/23/2019   Left arm numbness 02/28/2018   Neck pain 02/28/2018   Anemia 02/02/2017   DDD (degenerative disc disease), cervical 02/02/2017   Hypothyroid 02/02/2017   MRSA (methicillin resistant staph aureus) culture positive 02/02/2017   Nocturnal hypoxia 02/02/2017   Senile purpura ([place]) 10/03/2016   Essential hypertension, benign 09/16/2016   Bilateral carotid artery disease ([place]) 09/16/2016   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/18/2016   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 [place], [address] Phone: [phone]   Fax:  [phone]  Name: Francisco [name] MRN: [id number] Date of Birth: June 19, 1946

## 2020-04-09 ENCOUNTER — Ambulatory Visit: Payer: Medicare Other | Admitting: Occupational Therapy

## 2020-04-09 ENCOUNTER — Other Ambulatory Visit: Payer: Self-pay

## 2020-04-09 DIAGNOSIS — M25631 Stiffness of right wrist, not elsewhere classified: Secondary | ICD-10-CM | POA: Diagnosis not present

## 2020-04-09 DIAGNOSIS — L905 Scar conditions and fibrosis of skin: Secondary | ICD-10-CM

## 2020-04-09 DIAGNOSIS — M25531 Pain in right wrist: Secondary | ICD-10-CM

## 2020-04-09 DIAGNOSIS — M6281 Muscle weakness (generalized): Secondary | ICD-10-CM

## 2020-04-09 NOTE — Therapy (Signed)
[place] [address] [place], [address] Phone: [phone]   Fax:  [phone]  Occupational Therapy Treatment  Patient Details  Name: Francisco [name] MRN: [id number] Date of Birth: 10-Sep-1946 Referring Provider (OT): Caryn Section   Encounter Date: 04/09/2020   OT End of Session - 04/09/20 0959    Visit Number 5    Number of Visits 12    Date for OT Re-Evaluation 05/08/20    OT Start Time 0949    OT Stop Time 1025    OT Time Calculation (min) 36 min    Activity Tolerance Patient tolerated treatment well    Behavior During Therapy [facility] for tasks assessed/performed           Past Medical History:  Diagnosis Date  . Arthritis    lower left hip  . Bilateral hand numbness    from back surgery  . Bronchitis, chronic ([place])   . 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 Hypercholesterolemia   . Hypertension    doesn't have a cardiologist;pt is maintained by medical md for htn;[facility] from [facility]  . Hypothyroidism    pt takes Levothyroxine daily  . Myasthenia gravis, adult form ([place])   . Shortness of breath    Lung MD- Dr Darlin Coco  . Sleep apnea    do not use CPAP every night    Past Surgical History:  Procedure Laterality Date  . ANTERIOR CERVICAL DECOMP/DISCECTOMY FUSION  07/18/2011   Procedure: ANTERIOR CERVICAL DECOMPRESSION/DISCECTOMY FUSION 2 LEVELS;  Surgeon: Cooper Render Pool;  Location: [place] [facility];  Service: Neurosurgery;  Laterality: N/A;  cervical five-six, cervical six-seven anterior cervical discectomy and fusion  . BACK SURGERY     in [place]     2005 at [facility], no stents  . CATARACT EXTRACTION W/PHACO Left 01/06/2020   Procedure: CATARACT EXTRACTION PHACO AND INTRAOCULAR LENS PLACEMENT (IOC) ISTENT INJ  LEFT 3.81  00:33.3;  Surgeon: Eulogio Bear, MD;  Location: [place];  Service: Ophthalmology;  Laterality: Left;  . CATARACT EXTRACTION W/PHACO Right 02/03/2020   Procedure: CATARACT EXTRACTION PHACO AND INTRAOCULAR LENS PLACEMENT ([place]) RIGHT ISTENT INJ;  Surgeon: Eulogio Bear, MD;  Location: [place];  Service: Ophthalmology;  Laterality: Right;  4.29 0:35.6  . HERNIA REPAIR Left    inguinal hernia repair in 1985  . LUMBAR LAMINECTOMY/DECOMPRESSION MICRODISCECTOMY Left 02/24/2014   Procedure: LUMBAR LAMINECTOMY/DECOMPRESSION MICRODISCECTOMY LUMBAR THREE-FOUR, FOUR-FIVE, LEFT FIVE-SACRAL ONE ;  Surgeon: Charlie Pitter, MD;  Location: [place] [facility];  Service: Neurosurgery;  Laterality: Left;  LUMBAR LAMINECTOMY/DECOMPRESSION MICRODISCECTOMY LUMBAR THREE-FOUR, FOUR-FIVE, LEFT FIVE-SACRAL ONE   . PROSTATECTOMY  8/14   [facility] Dr Mare Ferrari     There were no vitals filed for this visit.   Subjective Assessment - 04/09/20 0955    Subjective  My still have my cut on my thumb - but I think I can dip the scar    Pertinent History Patient is s/p carpal tunnel release right hand on 01/28/2020, Dr. Trenton Gammon.  Pt was supposed to have surgery on his left hand however he delayed the surgery now secondary to the progress with his left hand.    Patient Stated Goals Pt would like to get his strength back in  the hand and remain pain free.              Sensation same as previous session with Thornell Mule - 2.83 impaired distal to PIP of 2nd and 3rd , and distal to [facility] of thumb           OT Treatments/Exercises (OP) - 04/09/20 0001      RUE Paraffin   Number Minutes Paraffin 8 Minutes    RUE Paraffin Location Hand    Comments prior to scar massage             paraffin done only ulnar side of hand and scar - bandaid on thumb because of cut earlier this week  Scar massageperformedby OT, manualcarpal spreads  Vibrationperformedwithuse ofmini massageralong  scarline and surrounding area graston tool nr 2 sweeping over volar wrist and forearm, light sweeping since patient bruises easily Prior to stretch for wrist extention and flexion  Tendon gliding exercises on right withblocking ofwrist from collapsing into flexion, 10 reps - needed min A  But better this date - able to maintain wrist neutral with making composite fist  Median nerveglide - 5 reps as outlined in past , did not perform the last 2 steps due to limitation in supination motion which has been limited since childhood per patient report.        OT Education - 04/09/20 0959    Education Details progress    Person(s) Educated Patient    Methods Explanation;Demonstration    Comprehension Verbalized understanding;Returned demonstration               OT Long Term Goals - 04/09/20 1036      OT LONG TERM GOAL #1   Title Patient to improve FOTO score by 10 points to be able to carry a bag in his right hand.    Baseline FOTO to do next session - but can carry objects    Time 2    Period Weeks    Status On-going    Target Date 04/30/20      OT LONG TERM GOAL #2   Title Patient will be independent with use of contrast for edema control measures.    Status Achieved      OT LONG TERM GOAL #3   Title Patient will improve supination by 10 degrees to assist with holding and carrying objects.    Baseline Pt same as L side - limit prior to surgery    Status Deferred      OT LONG TERM GOAL #4   Title Patient will improve grip strength in right hand by 10# to carry items from car to house.    Baseline increase - but had cut earlier this week on thumb - unable to assess grip and prehension    Time 2    Period Weeks    Status On-going    Target Date 04/30/20      OT LONG TERM GOAL #5   Title Patient will improve right hand function to be able to manage buttons with modified independence and greater ease    Status Achieved                 Plan - 04/09/20  1000    Clinical Impression Statement Pt about 10 wks s/p R CTR - scar tissue and sensation improving - and AROM and strength- pt had cut on the thumb earlier this week - cannot assess grip or prehension - pt will be on vacation next  week - Thornell Mule still decrease for 2.83 distal PIP of 2nd and 3rd , and MC of thumb - pt to have L hand CTS end of next month    OT Occupational Profile and History Detailed Assessment- Review of Records and additional review of physical, cognitive, psychosocial history related to current functional performance    Occupational performance deficits (Please refer to evaluation for details): ADL's;IADL's;Leisure;Work    Marketing executive / Function / Physical Skills ADL;Flexibility;ROM;UE functional use;FMC;Scar mobility;Dexterity;Sensation;Edema;Pain;Strength;IADL;Coordination    Psychosocial Skills Environmental  Adaptations;Habits;Routines and Behaviors    Rehab Potential Good    Clinical Decision Making Limited treatment options, no task modification necessary    Comorbidities Affecting Occupational Performance: May have comorbidities impacting occupational performance    Modification or Assistance to Complete Evaluation  No modification of tasks or assist necessary to complete eval    OT Frequency 2x / week    OT Duration 4 weeks    OT Treatment/Interventions Self-care/ADL training;Therapeutic exercise;Moist Heat;Paraffin;Neuromuscular education;Splinting;Patient/family education;Fluidtherapy;Scar mobilization;Therapeutic activities;Cryotherapy;Ultrasound;Contrast Bath;Manual Therapy    Consulted and Agree with Plan of Care Patient           Patient will benefit from skilled therapeutic intervention in order to improve the following deficits and impairments:   Body Structure / Function / Physical Skills: ADL, Flexibility, ROM, UE functional use, FMC, Scar mobility, Dexterity, Sensation, Edema, Pain, Strength, IADL, Coordination   Psychosocial Skills:  Environmental  Adaptations, Habits, Routines and Behaviors   Visit Diagnosis: Muscle weakness (generalized)  Pain in right wrist  Stiffness of right wrist joint  Scar tissue    Problem List Patient Active Problem List   Diagnosis Date Noted  . PAD (peripheral artery disease) ([place]) 06/06/2019  . CKD (chronic kidney disease) stage 3, GFR 30-59 ml/min 04/29/2019  . B12 deficiency 01/23/2019  . Left arm numbness 02/28/2018  . Neck pain 02/28/2018  . Anemia 02/02/2017  . DDD (degenerative disc disease), cervical 02/02/2017  . Hypothyroid 02/02/2017  . MRSA (methicillin resistant staph aureus) culture positive 02/02/2017  . Nocturnal hypoxia 02/02/2017  . Senile purpura ([place]) 10/03/2016  . Essential hypertension, benign 09/16/2016  . Bilateral carotid artery disease ([place]) 09/16/2016  . 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/18/2016  . 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 History of prostate cancer 12/21/2015  . Left carpal tunnel syndrome 11/11/2015  . Kidney stone on left side 07/05/2015  . Myasthenia gravis ([place]) 05/26/2015  . Long-term use of high-risk medication 10/15/2014  . Persistent cough 09/10/2014  . Pure hypercholesterolemia 07/25/2014  . Spinal stenosis, lumbar region, with neurogenic claudication 02/24/2014  . Lumbosacral stenosis with neurogenic claudication ([place]) 02/24/2014  . COPD (chronic obstructive pulmonary disease) ([place]) 01/22/2014    Rosalyn Gess OTR/L,CLT 04/09/2020, 10:40 AM  [facility] [address] [place], [address] Phone: [phone]   Fax:  [phone]  Name: JUANJESUS PEPPERMAN MRN: [id number] Date of Birth: [birth year]

## 2020-04-15 ENCOUNTER — Ambulatory Visit: Payer: Medicare Other

## 2020-04-21 ENCOUNTER — Ambulatory Visit: Payer: Medicare Other | Admitting: Occupational Therapy

## 2020-04-21 ENCOUNTER — Ambulatory Visit: Payer: Medicare Other | Attending: Internal Medicine

## 2020-04-21 ENCOUNTER — Other Ambulatory Visit: Payer: Self-pay

## 2020-04-21 DIAGNOSIS — M5442 Lumbago with sciatica, left side: Secondary | ICD-10-CM | POA: Diagnosis present

## 2020-04-21 DIAGNOSIS — M25552 Pain in left hip: Secondary | ICD-10-CM | POA: Diagnosis present

## 2020-04-21 DIAGNOSIS — M6281 Muscle weakness (generalized): Secondary | ICD-10-CM | POA: Diagnosis present

## 2020-04-21 DIAGNOSIS — G8929 Other chronic pain: Secondary | ICD-10-CM | POA: Insufficient documentation

## 2020-04-21 NOTE — Therapy (Signed)
[facility] [address] [place], [address] Phone: [phone]   Fax:  [phone]  Physical Therapy Evaluation  Patient Details  Name: Francisco [name] MRN: [id number] Date of Birth: December 24, 1945 No data recorded  Encounter Date: 04/21/2020   PT End of Session - 04/21/20 1107    Visit Number 1    Number of Visits 13    Date for PT Re-Evaluation 06/02/20    PT Start Time 0930    PT Stop Time 1030    PT Time Calculation (min) 60 min    Activity Tolerance Patient tolerated treatment well    Behavior During Therapy [facility] for tasks assessed/performed           Past Medical History:  Diagnosis Date  . Arthritis    lower left hip  . Bilateral hand numbness    from back surgery  . Bronchitis, chronic ([place])   . Cancer [facility])    Prostate cancr 02/2013; Merkel cell cancer, and Basal cell cancer (twice; 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  . 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 Hypercholesterolemia   . Hypertension    doesn't have a cardiologist;pt is maintained by medical md for htn;requ[facility]  . Hypothyroidism    pt takes Levothyroxine daily  . Myasthenia gravis, adult form ([place])   . Shortness of breath    Lung MD- Dr Darlin Coco  . Sleep apnea    do not use CPAP every night    Past Surgical History:  Procedure Laterality Date  . ANTERIOR CERVICAL DECOMP/DISCECTOMY FUSION  07/18/2011   Procedure: ANTERIOR CERVICAL DECOMPRESSION/DISCECTOMY FUSION 2 LEVELS;  Surgeon: Cooper Render Pool;  Location: [place] [facility];  Service: Neurosurgery;  Laterality: N/A;  cervical five-six, cervical six-seven anterior cervical discectomy and fusion  . BACK SURGERY     in [place]     2005 at [facility], no stents  . CATARACT EXTRACTION W/PHACO Left 01/06/2020   Procedure: CATARACT EXTRACTION PHACO AND INTRAOCULAR LENS PLACEMENT (IOC) ISTENT INJ LEFT 3.81  00:33.3;   Surgeon: Eulogio Bear, MD;  Location: [place];  Service: Ophthalmology;  Laterality: Left;  . CATARACT EXTRACTION W/PHACO Right 02/03/2020   Procedure: CATARACT EXTRACTION PHACO AND INTRAOCULAR LENS PLACEMENT ([place]) RIGHT ISTENT INJ;  Surgeon: Eulogio Bear, MD;  Location: [place];  Service: Ophthalmology;  Laterality: Right;  4.29 0:35.6  . HERNIA REPAIR Left    inguinal hernia repair in 1985  . LUMBAR LAMINECTOMY/DECOMPRESSION MICRODISCECTOMY Left 02/24/2014   Procedure: LUMBAR LAMINECTOMY/DECOMPRESSION MICRODISCECTOMY LUMBAR THREE-FOUR, FOUR-FIVE, LEFT FIVE-SACRAL ONE ;  Surgeon: Charlie Pitter, MD;  Location: [place] [facility];  Service: Neurosurgery;  Laterality: Left;  LUMBAR LAMINECTOMY/DECOMPRESSION MICRODISCECTOMY LUMBAR THREE-FOUR, FOUR-FIVE, LEFT FIVE-SACRAL ONE   . PROSTATECTOMY  8/14   [facility] Dr Mare Ferrari     There were no vitals filed for this visit.    Subjective Assessment - 04/21/20 1050    Subjective Patient is a 74 y/o male with a medical diagnosis of spinal stenosis with a c/c of lower back pain that radiates into his L hip that began 8-9 years ago that has been limiting his daily and leisurely activities. Patient has a history of cervical fusion and two lower lumbar fusions, prostate and skin cancer, and COPD stage 2. Patient reports that his pain increases in his lower back and L hip when he stands about  10 minutes, walks for about 1 minutes, ascends stairs repeatedly, and reaches overhead at which his worst pain can get to 10/10 NPS.  Easing factors that helps decrease patients back pain to 0/10 NPS is sitting and lying down along with taking medication.  Patients states that the LBP and hip pain are limiting his daily and leisurely activities such as housework and yardwork.  Patient goal from therapy is to be able to perform daily and leisure activities independently without pain limiting him from doing so.    Pertinent History Low back and L LE  pain. Pt states that he has a history of cervical and lumbar fusions. Currently has LBP along with L hip pain. Pt also uses his SPC on his R side which helps with confidence of not falling.  No falls for the past 6 months. 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    Limitations Walking;Lifting;Standing;House hold activities    How long can you stand comfortably? 10 minutes    How long can you walk comfortably? 1 minute    Patient Stated Goals To be able to stand/walk and perform daily and lesiurely activites without pain limiting him.    Currently in Pain? Yes    Pain Score 3     Pain Location Back              [facility] PT Assessment - 04/21/20 0001      Assessment   Medical Diagnosis Adin Hector, MD     Onset Date/Surgical Date 04/22/11    Prior Therapy yes       Balance Screen   Has the patient fallen in the past 6 months No    Has the patient had a decrease in activity level because of a fear of falling?  No    Is the patient reluctant to leave their home because of a fear of falling?  No      Home Environment   Living Environment Private residence    Living Arrangements Spouse/significant other    Type of [place] to enter    [place] One level      Prior Function   Level of [place] Full time employment    Leisure household and yard work      Observation/Other Assessments   Focus on Therapeutic Outcomes (FOTO)  44      Functional Tests   Functional tests Sit to Stand      ROM / Strength   AROM / PROM / Strength AROM;Strength      AROM   AROM Assessment Site Hip;Knee;Lumbar    Right/Left Hip Right;Left    Right Hip Flexion 90    Left Hip Flexion 90    Lumbar Flexion WFL    Lumbar Extension 75% limited with pain       Strength    Strength Assessment Site Lumbar    Right Hip Flexion 4/5    Right Hip Extension 4/5    Right Hip External Rotation  4/5    Right Hip Internal Rotation 4/5    Right Hip ABduction 4-/5    Left Hip Flexion 4-/5    Left Hip Extension 4-/5    Left Hip External Rotation 4-/5  Left Hip Internal Rotation 4-/5    Left Hip ABduction 4-/5    Right Knee Flexion 4/5    Right Knee Extension 5/5    Left Knee Flexion 4/5    Left Knee Extension 5/5    Lumbar Flexion 4+/5    Lumbar Extension 4/5      Flexibility   Soft Tissue Assessment /Muscle Length yes    Hamstrings Limited by 50%      Special Tests    Special Tests Lumbar;Leg LengthTest;Hip Special Tests    Lumbar Tests Straight Leg Raise      Straight Leg Raise   Findings Positive    Side  Left      Transfers   Five time sit to stand comments  13.5 sec       Ambulation/Gait   Ambulation/Gait Yes    Ambulation/Gait Assistance 7: Independent    Ambulation Distance (Feet) 735 Feet   6MWT   Assistive device Straight cane    Stairs Yes    Stairs Assistance 7: Independent    Stair Management Technique One rail Right    Number of Stairs --   12     6 Minute Walk- Baseline   6 Minute Walk- Baseline yes      6 minute walk test results    Aerobic Endurance Distance [id number]   983 ft      Functional Gait  Assessment   Gait assessed  Yes                      Objective measurements completed on examination: See above findings.               PT Education - 04/21/20 1106    Education provided Yes    Education Details Educating patient on diagnosis    Person(s) Educated Patient    Methods Explanation;Demonstration    Comprehension Verbalized understanding;Returned demonstration            PT Short Term Goals - 04/21/20 1111      PT SHORT TERM GOAL #1   Title Patient will be independent in HEP to maximize rehab potential.    Time 3    Period Weeks    Status New    Target Date 05/12/20      PT  SHORT TERM GOAL #2   Title Patient worst pain will decrease to 7/10 to show an improvement in ability to perform activities.    Baseline 10/10    Time 3    Period Weeks    Status New    Target Date 05/12/20             PT Long Term Goals - 04/21/20 1113      PT LONG TERM GOAL #1   Title Patient will demonstrate independence with HEP to maintain progress made during therapy.    Time 6    Period Weeks    Status New    Target Date 06/02/20      PT LONG TERM GOAL #2   Title Patient will increase 6MWT by by 133ft to show an improvement in aerobic capacity and muscular endurance.    Baseline 736ft    Time 6    Period Weeks    Status New    Target Date 06/02/20      PT LONG TERM GOAL #3   Title Patient worst pain will decrease to 4/10 to show an improvement in ability to perform activities.  Baseline 101/10    Time 6    Period Weeks    Status New    Target Date 06/02/20      PT LONG TERM GOAL #4   Title Patient will improve FOTO score to 54 to show an improvement in ability to perform functional and lesiure activities.    Baseline 44    Time 6    Period Weeks    Status New    Target Date 06/02/20                  Plan - 04/21/20 1108    Clinical Impression Statement Patient is a 74 y/o male with a medical diagnosis of spinal stenosis with a c/c of lower back pain that radiates into his L hip that began 8-9 years ago that has been limiting his daily and leisurely activities. Upon evaluation patient has limited lumbar extension that is painful and relieved when coming back into neutral/flexion position.  Patient also has bilateral hip weakness with the L LE being weaker when compared to contralateral side along with a positive SLR on L LE. Patient was able to walk around 130ft  and ascend/descend 12 stairs before L sided LBP began along with L hip pain. With the above findings, L hip weakness along with prior back history is likely the contributing factors that are  leading to the patients L sided LBP and L hip pain when walking, standing, or performing activities.  Patient will benefit from skilled therapy to progress towards goals and return to prior level of function.    Personal Factors and Comorbidities Age;Comorbidity 2    Comorbidities COPD, History of Cancer, Age,    Examination-Activity Limitations Lift;Squat;Bend;Stairs;Stand;Transfers;Locomotion Level;Reach Overhead    Examination-Participation Restrictions Yard Work    Stability/Clinical Decision Making Evolving/Moderate complexity    Clinical Decision Making Moderate    Rehab Potential Good    Clinical Impairments Affecting Rehab Potential PMH, age, weakness, pain    PT Frequency 2x / week    PT Duration 6 weeks    PT Treatment/Interventions ADLs/Self Care Home Management;Electrical Stimulation;Moist Heat;Traction;Ultrasound;Gait training;Stair training;Functional mobility training;Therapeutic activities;Therapeutic exercise;Balance training;Neuromuscular re-education;Patient/family education;Manual techniques;Passive range of motion;Dry needling;Spinal Manipulations;Joint Manipulations    PT Next Visit Plan Hip Strengthening and overall endurance    Consulted and Agree with Plan of Care Patient           Patient will benefit from skilled therapeutic intervention in order to improve the following deficits and impairments:  Abnormal gait, Decreased balance, Decreased mobility, Decreased endurance, Difficulty walking, Hypomobility, Impaired sensation, Decreased range of motion, Impaired perceived functional ability, Decreased activity tolerance, Decreased coordination, Decreased strength, Impaired flexibility, Postural dysfunction, Pain  Visit Diagnosis: Chronic left-sided low back pain with left-sided sciatica  Muscle weakness (generalized)  Pain in left hip     Problem List Patient Active Problem List   Diagnosis Date Noted  . PAD (peripheral artery disease) ([place]) 06/06/2019  .  CKD (chronic kidney disease) stage 3, GFR 30-59 ml/min 04/29/2019  . B12 deficiency 01/23/2019  . Left arm numbness 02/28/2018  . Neck pain 02/28/2018  . Anemia 02/02/2017  . DDD (degenerative disc disease), cervical 02/02/2017  . Hypothyroid 02/02/2017  . MRSA (methicillin resistant staph aureus) culture positive 02/02/2017  . Nocturnal hypoxia 02/02/2017  . Senile purpura ([place]) 10/03/2016  . Essential hypertension, benign 09/16/2016  . Bilateral carotid artery disease ([place]) 09/16/2016  . 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/18/2016  . 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  History of prostate cancer 12/21/2015  . Left carpal tunnel syndrome 11/11/2015  . Kidney stone on left side 07/05/2015  . Myasthenia gravis ([place]) 05/26/2015  . Long-term use of high-risk medication 10/15/2014  . Persistent cough 09/10/2014  . Pure hypercholesterolemia 07/25/2014  . Spinal stenosis, lumbar region, with neurogenic claudication 02/24/2014  . Lumbosacral stenosis with neurogenic claudication ([place]) 02/24/2014  . COPD (chronic obstructive pulmonary disease) ([place]) 01/22/2014   11:20 AM, 04/21/20 Margarito Liner, SPT Student Physical Therapist [place]  [phone]  Givanni Staron 04/21/2020, 11:19 AM  [facility] [address] [place], [address] Phone: [phone]   Fax:  [phone]  Name: Francisco [name] MRN: [id number] Date of Birth: 09-25-1945

## 2020-04-23 ENCOUNTER — Ambulatory Visit: Payer: Medicare Other | Admitting: Occupational Therapy

## 2020-04-27 ENCOUNTER — Ambulatory Visit: Payer: Medicare Other | Admitting: Occupational Therapy

## 2020-04-29 ENCOUNTER — Ambulatory Visit: Payer: Medicare Other | Admitting: Occupational Therapy

## 2020-05-04 ENCOUNTER — Telehealth (INDEPENDENT_AMBULATORY_CARE_PROVIDER_SITE_OTHER): Payer: Self-pay

## 2020-05-04 ENCOUNTER — Ambulatory Visit: Payer: Medicare Other | Admitting: Occupational Therapy

## 2020-05-11 ENCOUNTER — Ambulatory Visit: Payer: Self-pay | Attending: Internal Medicine

## 2020-05-11 ENCOUNTER — Ambulatory Visit: Payer: Medicare Other

## 2020-05-11 DIAGNOSIS — Z23 Encounter for immunization: Secondary | ICD-10-CM

## 2020-05-11 NOTE — Progress Notes (Signed)
   Covid-19 Vaccination Clinic  Name:  Najib Colmenares    MRN: [id number] DOB: March 04, 1946  05/11/2020  Mr. Joiner was observed post Covid-19 immunization for 15 minutes without incident. He was provided with Vaccine Information Sheet and instruction to access the V-Safe system.   Mr. Gianino was instructed to call 911 with any severe reactions post vaccine: Marland Kitchen Difficulty breathing  . Swelling of face and throat  . A fast heartbeat  . A bad rash all over body  . Dizziness and weakness

## 2020-05-13 ENCOUNTER — Ambulatory Visit: Payer: Medicare Other

## 2020-05-19 ENCOUNTER — Ambulatory Visit: Payer: Medicare Other | Attending: Internal Medicine

## 2020-05-21 ENCOUNTER — Ambulatory Visit: Payer: Medicare Other

## 2020-05-25 ENCOUNTER — Ambulatory Visit: Payer: Medicare Other

## 2020-05-28 ENCOUNTER — Ambulatory Visit: Payer: Medicare Other

## 2020-06-02 ENCOUNTER — Ambulatory Visit: Payer: Medicare Other

## 2020-06-04 ENCOUNTER — Ambulatory Visit: Payer: Medicare Other

## 2020-06-16 ENCOUNTER — Other Ambulatory Visit: Payer: Self-pay | Admitting: Internal Medicine

## 2020-06-16 DIAGNOSIS — R531 Weakness: Secondary | ICD-10-CM

## 2020-06-17 ENCOUNTER — Other Ambulatory Visit: Payer: Self-pay

## 2020-06-17 ENCOUNTER — Ambulatory Visit
Admission: RE | Admit: 2020-06-17 | Discharge: 2020-06-17 | Disposition: A | Discharge status: Home / Self Care | Payer: Medicare Other | Source: Ambulatory Visit | Attending: Internal Medicine | Admitting: Internal Medicine

## 2020-06-17 DIAGNOSIS — R531 Weakness: Secondary | ICD-10-CM | POA: Insufficient documentation

## 2020-06-19 ENCOUNTER — Telehealth (INDEPENDENT_AMBULATORY_CARE_PROVIDER_SITE_OTHER): Payer: Self-pay | Admitting: Vascular Surgery

## 2020-06-19 NOTE — Telephone Encounter (Signed)
After speaking with the patient and letting them know we do not take care of the Brain as a Vascular office, the patient's wife stated she was calling because the patient needed to have his carotid u/s scheduled. Patient's wife stated the patient has not had one since last year, patient's last u/s was on 05/2019. Destiny can you please schedule the patient for a bilateral carotid u/s. Thank you

## 2020-06-19 NOTE — Telephone Encounter (Signed)
Wife called and would like for patient to come in to be seen per his MRI of the brain without contrast [facility]). Wife says that chronic hardening of artery. Patient was last seen with ABI studies([facility]). Please advise.

## 2020-06-22 ENCOUNTER — Ambulatory Visit (INDEPENDENT_AMBULATORY_CARE_PROVIDER_SITE_OTHER): Payer: Medicare Other | Admitting: Nurse Practitioner

## 2020-06-22 ENCOUNTER — Encounter (INDEPENDENT_AMBULATORY_CARE_PROVIDER_SITE_OTHER): Payer: Self-pay | Admitting: Nurse Practitioner

## 2020-06-22 ENCOUNTER — Other Ambulatory Visit: Payer: Self-pay

## 2020-06-22 ENCOUNTER — Ambulatory Visit (INDEPENDENT_AMBULATORY_CARE_PROVIDER_SITE_OTHER): Payer: Medicare Other

## 2020-06-22 VITALS — BP 140/53 | HR 65 | Resp 16 | Wt 235.4 lb

## 2020-06-22 DIAGNOSIS — I739 Peripheral vascular disease, unspecified: Secondary | ICD-10-CM | POA: Diagnosis not present

## 2020-06-22 DIAGNOSIS — I6523 Occlusion and stenosis of bilateral carotid arteries: Secondary | ICD-10-CM | POA: Diagnosis not present

## 2020-06-22 DIAGNOSIS — I10 Essential (primary) hypertension: Secondary | ICD-10-CM

## 2020-06-22 DIAGNOSIS — M48062 Spinal stenosis, lumbar region with neurogenic claudication: Secondary | ICD-10-CM | POA: Diagnosis not present

## 2020-06-24 ENCOUNTER — Encounter (INDEPENDENT_AMBULATORY_CARE_PROVIDER_SITE_OTHER): Payer: Self-pay | Admitting: Nurse Practitioner

## 2020-06-24 NOTE — Progress Notes (Signed)
Subjective:    Patient ID: Francisco [name], male    DOB: 1946-02-01, 74 y.o.   MRN: [id number] Chief Complaint  Patient presents with  . Follow-up    ultrasound follow up    Francisco [name] is a 74 year old male who presents today for review of his carotid stenosis.  Recently the patient notes that he has been having profound weakness when walking.  This is recently been getting much worse in the last month or so.  The patient does have a number of back issues.  Balance has also been off recently.  The patient feels as if his left side is somewhat weaker than his right.  He also notes pain in the left leg that tends to get worse whenever he lays down.  He notes that the pain however is only in his thigh area he describes it as a sharp stabbing pain.  The patient had a recent MRI which showed no evidence of infarct however there was evidence of atherosclerotic disease.  The patient denies amaurosis fugax. There is no recent history of TIA symptoms or focal motor deficits. There is no prior documented CVA.  The patient is taking enteric-coated aspirin 81 mg daily.  There is no history of migraine headaches. There is no history of seizures.  The patient has a history of coronary artery disease, no recent episodes of angina or shortness of breath. The patient endorses having PAD or claudication symptoms. There is a history of hyperlipidemia which is being treated with a statin.    Carotid Duplex done today shows 80 to 99% stenosis in the right internal carotid artery.  Velocities in the left ICA are consistent with a 40 to 59% stenosis.  While the bilateral subclavian arteries had normal flow hemodynamics it is noted that the right subclavian had notably lower pressures greater than 30 mmHg in the right ICA.   Review of Systems  Musculoskeletal: Positive for gait problem and myalgias.  Neurological: Positive for weakness.  All other systems reviewed and are negative.      Objective:     Physical Exam Vitals reviewed.  HENT:     Head: Normocephalic.  Neck:     Vascular: No carotid bruit.  Cardiovascular:     Rate and Rhythm: Normal rate.     Pulses: Decreased pulses.     Heart sounds: Normal heart sounds.  Pulmonary:     Effort: Pulmonary effort is normal.  Neurological:     Mental Status: He is alert and oriented to person, place, and time.     Motor: Weakness present.     Gait: Gait abnormal.  Psychiatric:        Mood and Affect: Mood normal.        Behavior: Behavior normal.        Thought Content: Thought content normal.        Judgment: Judgment normal.     BP (!) 140/53 (BP Location: Right Arm)   Pulse 65   Resp 16   Wt 235 lb 6.4 oz (106.8 kg)   BMI 33.78 kg/m   Past Medical History:  Diagnosis Date  . Arthritis    lower left hip  . Bilateral hand numbness    from back surgery  . Bronchitis, chronic ([place])   . 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  Hypercholesterolemia   . Hypertension    doesn't have a cardiologist;pt is maintained by medical md for htn;requ[facility]  . Hypothyroidism    pt takes Levothyroxine daily  . Myasthenia gravis, adult form ([place])   . Shortness of breath    Lung MD- Dr Darlin Coco  . Sleep apnea    do not use CPAP every night    Social History   Socioeconomic History  . Marital status: Married    Spouse name: Not on file  . Number of children: Not on file  . Years of education: Not on file  . Highest education level: Not on file  Occupational History  . Not on file  Tobacco Use  . Smoking status: Former Smoker    Packs/day: 1.00    Years: 20.00    Pack years: 20.00    Types: Cigarettes    Quit date: 09/12/2001    Years since quitting: 18.7  . Smokeless tobacco: Never Used  Vaping Use  . Vaping Use: Never used  Substance and Sexual Activity   . Alcohol use: Yes    Comment: 7 beverages per week  . Drug use: No  . Sexual activity: Yes  Other Topics Concern  . Not on file  Social History Narrative  . Not on file   Social Determinants of Health   Financial Resource Strain:   . Difficulty of Paying Living Expenses: Not on file  Food Insecurity:   . Worried About Charity fundraiser in the Last Year: Not on file  . Ran Out of Food in the Last Year: Not on file  Transportation Needs:   . Lack of Transportation (Medical): Not on file  . Lack of Transportation (Non-Medical): Not on file  Physical Activity:   . Days of Exercise per Week: Not on file  . Minutes of Exercise per Session: Not on file  Stress:   . Feeling of Stress : Not on file  Social Connections:   . Frequency of Communication with Friends and Family: Not on file  . Frequency of Social Gatherings with Friends and Family: Not on file  . Attends Religious Services: Not on file  . Active Member of Clubs or Organizations: Not on file  . Attends Archivist Meetings: Not on file  . Marital Status: Not on file  Intimate Partner Violence:   . Fear of Current or Ex-Partner: Not on file  . Emotionally Abused: Not on file  . Physically Abused: Not on file  . Sexually Abused: Not on file    Past Surgical History:  Procedure Laterality Date  . ANTERIOR CERVICAL DECOMP/DISCECTOMY FUSION  07/18/2011   Procedure: ANTERIOR CERVICAL DECOMPRESSION/DISCECTOMY FUSION 2 LEVELS;  Surgeon: Cooper Render Pool;  Location: [place] [facility];  Service: Neurosurgery;  Laterality: N/A;  cervical five-six, cervical six-seven anterior cervical discectomy and fusion  . BACK SURGERY     in [place]     2005 at [facility], no stents  . CATARACT EXTRACTION W/PHACO Left 01/06/2020   Procedure: CATARACT EXTRACTION PHACO AND INTRAOCULAR LENS PLACEMENT (IOC) ISTENT INJ LEFT 3.81  00:33.3;  Surgeon: Eulogio Bear, MD;  Location: [place];  Service:  Ophthalmology;  Laterality: Left;  . CATARACT EXTRACTION W/PHACO Right 02/03/2020   Procedure: CATARACT EXTRACTION PHACO AND INTRAOCULAR LENS PLACEMENT ([place]) RIGHT ISTENT INJ;  Surgeon: Eulogio Bear, MD;  Location: [place];  Service: Ophthalmology;  Laterality: Right;  4.29 0:35.6  .  HERNIA REPAIR Left    inguinal hernia repair in 1985  . LUMBAR LAMINECTOMY/DECOMPRESSION MICRODISCECTOMY Left 02/24/2014   Procedure: LUMBAR LAMINECTOMY/DECOMPRESSION MICRODISCECTOMY LUMBAR THREE-FOUR, FOUR-FIVE, LEFT FIVE-SACRAL ONE ;  Surgeon: Charlie Pitter, MD;  Location: [place] [facility]UR[facility];  Service: Neurosurgery;  Laterality: Left;  LUMBAR LAMINECTOMY/DECOMPRESSION MICRODISCECTOMY LUMBAR THREE-FOUR, FOUR-FIVE, LEFT FIVE-SACRAL ONE   . PROSTATECTOMY  8/14   [facility] Dr Mare Ferrari     Family History  Problem Relation Age of Onset  . Hypertension Mother   . Stroke Mother   . Stroke Father     Allergies  Allergen Reactions  . Darvon [Propoxyphene]     darvocet  . Azithromycin Other (See Comments)    Avoid due to myasthenia gravis  . Codeine Nausea And Vomiting       Assessment & Plan:   1. Bilateral carotid artery stenosis The patient remains asymptomatic with respect to the carotid stenosis.  However, the patient has now progressed and has a lesion the is >70%.  Patient should undergo CT angiography of the carotid arteries to define the degree of stenosis of the internal carotid arteries bilaterally and the anatomic suitability for surgery vs. intervention.  If the patient does indeed need surgery cardiac clearance will be required, once cleared the patient will be scheduled for surgery.  The risks, benefits and alternative therapies were reviewed in detail with the patient.  All questions were answered.  The patient agrees to proceed with imaging.  Continue antiplatelet therapy as prescribed. Continue management of CAD, HTN and Hyperlipidemia. Healthy heart diet, encouraged exercise  at least 4 times per week.   - CT ANGIO HEAD W OR WO CONTRAST; Future - CT ANGIO NECK W OR WO CONTRAST; Future  2. Essential hypertension, benign Continue antihypertensive medications as already ordered, these medications have been reviewed and there are no changes at this time.   3. Spinal stenosis, lumbar region, with neurogenic claudication The patient has numerous back issues due to known spinal stenosis.  This is likely the cause of his continued weakness and possible balance issues.  Patient will continue to follow with his orthopedic surgeon for management.  4. PAD (peripheral artery disease) ([place]) The patient returns for review of his CT scans, we will also have ABIs done in order to ensure that the patient's weakness is not related to atherosclerotic risk factors as well.   Current Outpatient Medications on File Prior to Visit  Medication Sig Dispense Refill  . aspirin 81 MG tablet Take 81 mg by mouth every other day.     . azaTHIOprine (IMURAN) 50 MG tablet Take 150 mg by mouth daily.     . CHLOROTHIAZIDE PO Take 0.5 tablets by mouth.     . furosemide (LASIX) 20 MG tablet 40 mg daily.     Marland Kitchen gabapentin (NEURONTIN) 300 MG capsule Take 300 mg by mouth 2 (two) times daily.    . hydrochlorothiazide (HYDRODIURIL) 25 MG tablet TAKE 1/2 TABLET (12.5 MG TOTAL) BY MOUTH ONCE DAILY  3  . levothyroxine (SYNTHROID, LEVOTHROID) 88 MCG tablet Take 88 mcg by mouth daily.      Marland Kitchen losartan (COZAAR) 50 MG tablet Take 50 mg by mouth 2 (two) times daily.     . Magnesium 500 MG TABS Take by mouth daily.    . pantoprazole (PROTONIX) 40 MG tablet Take 20 mg by mouth 2 (two) times daily.     . predniSONE (DELTASONE) 20 MG tablet Take 40 mg by mouth every other day.    Marland Kitchen  pyridostigmine (MESTINON) 60 MG tablet Take 120 mg by mouth daily.     . vitamin B-12 (CYANOCOBALAMIN) 1000 MCG tablet Take 1,000 mcg by mouth daily.    . cholestyramine (QUESTRAN) 4 GM/DOSE powder  (Patient not taking: Reported on  03/25/2020)    . HYDROcodone-acetaminophen (NORCO) 10-325 MG tablet Take 1 tablet by mouth every 6 (six) hours as needed. 1 to 2 tablets every 24 hrs as needed    . ipratropium (ATROVENT) 0.06 % nasal spray Place 2 sprays into both nostrils in the morning and at bedtime. (Patient not taking: Reported on 03/25/2020)    . latanoprost (XALATAN) 0.005 % ophthalmic solution Place 1 drop into both eyes daily. (Patient not taking: Reported on 06/22/2020)  99  . rOPINIRole (REQUIP) 2 MG tablet Take 2 mg by mouth in the morning, at noon, and at bedtime. (Patient not taking: Reported on 06/22/2020)    . tamsulosin (FLOMAX) 0.4 MG CAPS capsule TAKE 1 CAPSULE BY MOUTH ONCE DAILY. TAKE 30 MINUTES AFTER SAME MEAL EACH DAY (Patient not taking: Reported on 03/25/2020)    . Tiotropium Bromide Monohydrate (SPIRIVA RESPIMAT) 1.25 MCG/ACT AERS Inhale 2 puffs into the lungs daily. (Patient not taking: Reported on 06/22/2020) 4 g 0   No current facility-administered medications on file prior to visit.    There are no Patient Instructions on file for this visit. No follow-ups on file.   Kris Hartmann, NP

## 2020-06-26 ENCOUNTER — Other Ambulatory Visit: Payer: Self-pay

## 2020-06-26 ENCOUNTER — Encounter: Payer: Self-pay | Admitting: Occupational Therapy

## 2020-06-26 ENCOUNTER — Ambulatory Visit: Payer: Medicare Other | Attending: Internal Medicine | Admitting: Occupational Therapy

## 2020-06-26 DIAGNOSIS — L905 Scar conditions and fibrosis of skin: Secondary | ICD-10-CM | POA: Diagnosis present

## 2020-06-26 DIAGNOSIS — M6281 Muscle weakness (generalized): Secondary | ICD-10-CM

## 2020-06-26 DIAGNOSIS — M25641 Stiffness of right hand, not elsewhere classified: Secondary | ICD-10-CM | POA: Diagnosis present

## 2020-06-26 DIAGNOSIS — M25632 Stiffness of left wrist, not elsewhere classified: Secondary | ICD-10-CM | POA: Diagnosis present

## 2020-06-26 DIAGNOSIS — M25642 Stiffness of left hand, not elsewhere classified: Secondary | ICD-10-CM

## 2020-06-26 NOTE — Therapy (Signed)
[place] [facility] [address] [place], [address] Phone: [phone]   Fax:  [phone]  Occupational Therapy Evaluation  Patient Details  Name: Francisco [name] MRN: [id number] Date of Birth: Aug 21, 1946 Referring Provider (OT): Dr Annette Stable   Encounter Date: 06/26/2020   OT End of Session - 06/26/20 1259    Visit Number 1    Number of Visits 4    Date for OT Re-Evaluation 07/24/20    OT Start Time 1045    OT Stop Time 1200    OT Time Calculation (min) 75 min    Activity Tolerance Patient tolerated treatment well    Behavior During Therapy [facility] for tasks assessed/performed           Past Medical History:  Diagnosis Date  . Arthritis    lower left hip  . Bilateral hand numbness    from back surgery  . Bronchitis, chronic ([place])   . 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 Hypercholesterolemia   . Hypertension    doesn't have a cardiologist;pt is maintained by medical md for htn;[facility] from [facility]  . Hypothyroidism    pt takes Levothyroxine daily  . Myasthenia gravis, adult form ([place])   . Shortness of breath    Lung MD- Dr Darlin Coco  . Sleep apnea    do not use CPAP every night    Past Surgical History:  Procedure Laterality Date  . ANTERIOR CERVICAL DECOMP/DISCECTOMY FUSION  07/18/2011   Procedure: ANTERIOR CERVICAL DECOMPRESSION/DISCECTOMY FUSION 2 LEVELS;  Surgeon: Cooper Render Pool;  Location: [place] [facility]S;  Service: Neurosurgery;  Laterality: N/A;  cervical five-six, cervical six-seven anterior cervical discectomy and fusion  . BACK SURGERY     in [place]     01/2020 Right, 04/2020 Left  . CARDIAC CATHETERIZATION     2005 at [facility], no stents  . CATARACT EXTRACTION W/PHACO Left 01/06/2020   Procedure:  CATARACT EXTRACTION PHACO AND INTRAOCULAR LENS PLACEMENT (IOC) ISTENT INJ LEFT 3.81  00:33.3;  Surgeon: Eulogio Bear, MD;  Location: [place];  Service: Ophthalmology;  Laterality: Left;  . CATARACT EXTRACTION W/PHACO Right 02/03/2020   Procedure: CATARACT EXTRACTION PHACO AND INTRAOCULAR LENS PLACEMENT ([place]) RIGHT ISTENT INJ;  Surgeon: Eulogio Bear, MD;  Location: [place];  Service: Ophthalmology;  Laterality: Right;  4.29 0:35.6  . HERNIA REPAIR Left    inguinal hernia repair in 1985  . LUMBAR LAMINECTOMY/DECOMPRESSION MICRODISCECTOMY Left 02/24/2014   Procedure: LUMBAR LAMINECTOMY/DECOMPRESSION MICRODISCECTOMY LUMBAR THREE-FOUR, FOUR-FIVE, LEFT FIVE-SACRAL ONE ;  Surgeon: Charlie Pitter, MD;  Location: [place] NEUR[facility] ORS;  Service: Neurosurgery;  Laterality: Left;  LUMBAR LAMINECTOMY/DECOMPRESSION MICRODISCECTOMY LUMBAR THREE-FOUR, FOUR-FIVE, LEFT FIVE-SACRAL ONE   . PROSTATECTOMY  8/14   [facility] Dr Mare Ferrari     There were no vitals filed for this visit.   Subjective Assessment - 06/26/20 1251    Subjective  My hands are still numb - they are weak and I drop things - you seen me for my R hand - I had my L carpal tunnel release about 8 wks ago    Pertinent History Patient is s/p carpal tunnel release right hand on 01/28/2020, Dr. Trenton Gammon.  Was seen by this OT in past for R  hand and now refer for L hand CTR about 8 wks ago per pt    Patient Stated Goals Pt would like to get the sensation and strength back in hands    Currently in Pain? No/denies             [facility] OT Assessment - 06/26/20 0001      Assessment   Medical Diagnosis L CTR    Referring Provider (OT) Dr Annette Stable    Onset Date/Surgical Date --   Aug 2021   Hand Dominance Right    Prior Therapy --   For L hand CTR     Balance Screen   Has the patient fallen in the past 6 months Yes    How many times? 3    Has the patient had a decrease in activity level because of a fear of falling?  Yes    Is the  patient reluctant to leave their home because of a fear of falling?  No      Home  Environment   Lives With Spouse      Prior Function   Level of Independence Independent    Vocation Full time employment   but thinking of retiring    Leisure decorate yard for holidays, on computer      AROM   Right Wrist Extension 60 Degrees    Right Wrist Flexion 70 Degrees    Left Wrist Extension 65 Degrees    Left Wrist Flexion 70 Degrees      Strength   Right Hand Grip (lbs) 46    Right Hand Lateral Pinch 15 lbs    Right Hand 3 Point Pinch 16 lbs    Left Hand Grip (lbs) 45    Left Hand Lateral Pinch 15 lbs    Left Hand 3 Point Pinch 14 lbs              Complete evaluation - pt show decrease composite extention if not focusing on 4th and 5th digit Unable to do opposition to 5th  ADD of 5th impaired Positive Tinel on [place] - deep pressure only on L 4th and 5th digit- decrease on R too -  Decrease writing and dropping objects  Decrease grip on bilateral compare to July  Pt report fell 3 x since seen my 3 months ago -has increase R weakness - had MRI - under MD care - having CT for blockage of carotid artery under Dr Bunnie Domino care Decline PT for balance and weakness-  at the moment to many things going on Seen by Clinic in [place] 2 x wk for neuropathy in feet - per pt       OT Treatments/Exercises (OP) - 06/26/20 0001      LUE Paraffin   Number Minutes Paraffin 8 Minutes    LUE Paraffin Location Hand    Comments prior to soft tissue mobs          scar massage done and pt ed on doing at home  CT spreads by OT  Review and add - tendon glides for pt to do - 10 reps Med N glide 5 reps  3 x day on R hand  Gentle PROM for wrist extention 10 reps          OT Education - 06/26/20 1258    Education Details findingsof eval and HEP/POC    Person(s) Educated Patient    Methods Explanation;Demonstration;Handout;Verbal cues;Tactile cues    Comprehension Verbalized  understanding;Returned demonstration;Verbal cues required  OT Long Term Goals - 06/26/20 1312      OT LONG TERM GOAL #1   Title Pt to be independent in HEP for scar tissue, tendon glides and nerve glides to improve sensation on Semmes Weinstein    Baseline see eval - [place] Mule results    Time 3    Period Weeks    Status New    Target Date 07/17/20      OT LONG TERM GOAL #2   Title Bilateral grip strength increase by 5-10 lbs to drop objects less    Baseline R 46 ,L 45 lbs - were in 50's on R , L 70's in July    Time 4    Period Weeks    Status New    Target Date 07/24/20      OT LONG TERM GOAL #3   Title Bilateral hand function improve for pt to do buttons, cutting food and write with more ease    Baseline difficulty with per pt - becaus of numbness    Time 4    Period Weeks    Status New    Target Date 07/24/20                 Plan - 06/26/20 1259    Clinical Impression Statement Pt present 8 wks s/p L CTR and 5 months s/p R CTR - pt this date show increase scar tissue on L , decrease grip strength on bilateral hands compare to 3 months ago, decrease AROM and strength in opposition of 5th and ADD of 5th - Sensation test pt only deep pressure on L 4thand 5th , Semmes Weinstein L 4.31 on prox phalanges of 2nd  and distal palm for 3rd ; thumb middle phalanges 3.61 ; R hand 3.61 on Thumb middle phalanges , and proximal phalanges for 3rd and 4th , 2nd distal palm - pt can benefit from OT services - Do appear that pt has maybe compressoin of bilateral ulnar N in [place] canal - will monitor    OT Occupational Profile and History Problem Focused Assessment - Including review of records relating to presenting problem    Occupational performance deficits (Please refer to evaluation for details): ADL's;IADL's;Leisure;Work    Marketing executive / Function / Physical Skills ADL;Flexibility;ROM;UE functional use;FMC;Scar  mobility;Dexterity;Sensation;Pain;Strength;IADL;Coordination    Psychosocial Skills Environmental  Adaptations    Rehab Potential Fair    Clinical Decision Making Limited treatment options, no task modification necessary    Comorbidities Affecting Occupational Performance: May have comorbidities impacting occupational performance    Modification or Assistance to Complete Evaluation  No modification of tasks or assist necessary to complete eval    OT Frequency 1x / week    OT Duration 4 weeks    OT Treatment/Interventions Self-care/ADL training;Therapeutic exercise;Paraffin;Neuromuscular education;Splinting;Patient/family education;Scar mobilization;Therapeutic activities;Ultrasound;Manual Therapy;Contrast Bath    Plan progress in sensation and scar tissue    Consulted and Agree with Plan of Care Patient           Patient will benefit from skilled therapeutic intervention in order to improve the following deficits and impairments:   Body Structure / Function / Physical Skills: ADL, Flexibility, ROM, UE functional use, FMC, Scar mobility, Dexterity, Sensation, Pain, Strength, IADL, Coordination   Psychosocial Skills: Environmental  Adaptations   Visit Diagnosis: Muscle weakness (generalized) - Plan: Ot plan of care cert/re-cert  Scar tissue - Plan: Ot plan of care cert/re-cert  Stiffness of left wrist, not elsewhere classified - Plan: Ot plan of  care cert/re-cert  Stiffness of left hand, not elsewhere classified - Plan: Ot plan of care cert/re-cert  Stiffness of right hand, not elsewhere classified - Plan: Ot plan of care cert/re-cert    Problem List Patient Active Problem List   Diagnosis Date Noted  . Myalgia 10/30/2019  . PAD (peripheral artery disease) ([place]) 06/06/2019  . CKD (chronic kidney disease) stage 3, GFR 30-59 ml/min (HCC) 04/29/2019  . B12 deficiency 01/23/2019  . Left arm numbness 02/28/2018  . Neck pain 02/28/2018  . Anemia 02/02/2017  . DDD (degenerative  disc disease), cervical 02/02/2017  . Hypothyroid 02/02/2017  . MRSA (methicillin resistant staph aureus) culture positive 02/02/2017  . Nocturnal hypoxia 02/02/2017  . Senile purpura ([place]) 10/03/2016  . Essential hypertension, benign 09/16/2016  . Bilateral carotid artery disease ([place]) 09/16/2016  . 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/18/2016  . 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 History of prostate cancer 12/21/2015  . Left carpal tunnel syndrome 11/11/2015  . Kidney stone on left side 07/05/2015  . Myasthenia gravis ([place]) 05/26/2015  . Long-term use of high-risk medication 10/15/2014  . Persistent cough 09/10/2014  . Pure hypercholesterolemia 07/25/2014  . Spinal stenosis, lumbar region, with neurogenic claudication 02/24/2014  . Lumbosacral stenosis with neurogenic claudication ([place]) 02/24/2014  . COPD (chronic obstructive pulmonary disease) ([place]) 01/22/2014    Rosalyn Gess OTR/L,CLT 06/26/2020, 1:45 PM  [place] [address] [place], [address] Phone: [phone]   Fax:  [phone]  Name: SHADRACH BARTUNEK MRN: [id number] Date of Birth: 07-26-1946

## 2020-06-30 ENCOUNTER — Telehealth (INDEPENDENT_AMBULATORY_CARE_PROVIDER_SITE_OTHER): Payer: Self-pay

## 2020-06-30 ENCOUNTER — Ambulatory Visit: Payer: Medicare Other | Admitting: Occupational Therapy

## 2020-06-30 NOTE — Telephone Encounter (Signed)
I called the pt's wife and left a VM for the above Message.

## 2020-06-30 NOTE — Telephone Encounter (Signed)
pt's wife left a message on the nurse's line saying she wanted to know if the pt's CT could be scheduled sooner that October 27 th. The pt's wife also wanted to know what percentage was her husbands carotid blocked. I called the pt and made her aware that her husbands blockage is 80-99% and that per Dr. Lucky Cowboy  Scheduling the pt to be seen sooner than the scheduled CT is not necessary that the issue is not urgent

## 2020-07-02 ENCOUNTER — Other Ambulatory Visit (INDEPENDENT_AMBULATORY_CARE_PROVIDER_SITE_OTHER): Payer: Self-pay | Admitting: Nurse Practitioner

## 2020-07-02 ENCOUNTER — Telehealth (INDEPENDENT_AMBULATORY_CARE_PROVIDER_SITE_OTHER): Payer: Self-pay

## 2020-07-02 DIAGNOSIS — I6523 Occlusion and stenosis of bilateral carotid arteries: Secondary | ICD-10-CM

## 2020-07-02 NOTE — Telephone Encounter (Signed)
The order is in

## 2020-07-02 NOTE — Telephone Encounter (Signed)
The pt's wife called and left a VM on the pt's behalf saying the pt has to have a CT of the head and neck on Oct 27 th and had to have Lab's done prior per [facility] imaging dept.   The pt's wife wanted to know where and when does the pt need to go to have the labs drawn. I called [facility] CT dept and I was told that the pt needs a STAT creatinine order.

## 2020-07-02 NOTE — Telephone Encounter (Signed)
I called and spoke to the pt's wife and made her aware.

## 2020-07-07 ENCOUNTER — Ambulatory Visit: Payer: Medicare Other | Admitting: Occupational Therapy

## 2020-07-08 ENCOUNTER — Other Ambulatory Visit: Payer: Medicare Other

## 2020-07-08 ENCOUNTER — Ambulatory Visit
Admission: RE | Admit: 2020-07-08 | Discharge: 2020-07-08 | Disposition: A | Discharge status: Home / Self Care | Payer: Medicare Other | Source: Ambulatory Visit | Attending: Nurse Practitioner | Admitting: Nurse Practitioner

## 2020-07-08 ENCOUNTER — Other Ambulatory Visit: Payer: Self-pay

## 2020-07-08 DIAGNOSIS — I6523 Occlusion and stenosis of bilateral carotid arteries: Secondary | ICD-10-CM

## 2020-07-08 LAB — POCT I-STAT CREATININE: Creatinine, Ser: 1.5 mg/dL — ABNORMAL HIGH (ref 0.61–1.24)

## 2020-07-08 MED ORDER — IOHEXOL 350 MG/ML SOLN
75.0000 mL | Freq: Once | INTRAVENOUS | Status: AC | PRN
  Administered 2020-07-08: 75 mL via INTRAVENOUS

## 2020-07-09 ENCOUNTER — Other Ambulatory Visit: Payer: Self-pay | Admitting: Internal Medicine

## 2020-07-09 DIAGNOSIS — I700 Atherosclerosis of aorta: Secondary | ICD-10-CM | POA: Insufficient documentation

## 2020-07-09 DIAGNOSIS — M5030 Other cervical disc degeneration, unspecified cervical region: Secondary | ICD-10-CM

## 2020-07-09 DIAGNOSIS — R270 Ataxia, unspecified: Secondary | ICD-10-CM

## 2020-07-10 ENCOUNTER — Encounter (INDEPENDENT_AMBULATORY_CARE_PROVIDER_SITE_OTHER): Payer: Self-pay | Admitting: Vascular Surgery

## 2020-07-10 ENCOUNTER — Other Ambulatory Visit: Payer: Self-pay

## 2020-07-10 ENCOUNTER — Ambulatory Visit (INDEPENDENT_AMBULATORY_CARE_PROVIDER_SITE_OTHER): Payer: Medicare Other | Admitting: Vascular Surgery

## 2020-07-10 VITALS — BP 110/57 | HR 68 | Resp 16 | Wt 233.0 lb

## 2020-07-10 DIAGNOSIS — E7800 Pure hypercholesterolemia, unspecified: Secondary | ICD-10-CM | POA: Diagnosis not present

## 2020-07-10 DIAGNOSIS — N183 Chronic kidney disease, stage 3 unspecified: Secondary | ICD-10-CM | POA: Diagnosis not present

## 2020-07-10 DIAGNOSIS — R29898 Other symptoms and signs involving the musculoskeletal system: Secondary | ICD-10-CM

## 2020-07-10 DIAGNOSIS — I10 Essential (primary) hypertension: Secondary | ICD-10-CM | POA: Diagnosis not present

## 2020-07-10 DIAGNOSIS — I6523 Occlusion and stenosis of bilateral carotid arteries: Secondary | ICD-10-CM | POA: Diagnosis not present

## 2020-07-10 NOTE — Progress Notes (Signed)
MRN : [id number]  Francisco [name] is a 74 y.o. (03-29-1946) male who presents with chief complaint of  Chief Complaint  Patient presents with  . Follow-up    ct results  .  History of Present Illness: Patient returns today in follow up of his carotid disease.  He continues to have marked leg weakness and that is his main complaint.  No new focal symptoms since his last visit.  He has undergone a CT angiogram of the neck which I have independently reviewed. I have independently reviewed his CT angiogram of the neck.  I would respectfully disagree with the radiologist interpretation of a 50% stenosis.  This appear clearly more than that and I would gauge it at least in the 75 to 80% range.  I would agree with her assessment of a very large ulcerative lesion as there is quite a large ulcer.  My interpretation would correlate more with the carotid duplex which also suggested a high-grade stenosis.  Current Outpatient Medications  Medication Sig Dispense Refill  . aspirin 81 MG tablet Take 81 mg by mouth every other day.     . azaTHIOprine (IMURAN) 50 MG tablet Take 150 mg by mouth daily.     . CHLOROTHIAZIDE PO Take 0.5 tablets by mouth.     . furosemide (LASIX) 20 MG tablet 40 mg daily.     Marland Kitchen gabapentin (NEURONTIN) 300 MG capsule Take 300 mg by mouth 2 (two) times daily.    . hydrochlorothiazide (HYDRODIURIL) 25 MG tablet TAKE 1/2 TABLET (12.5 MG TOTAL) BY MOUTH ONCE DAILY  3  . HYDROcodone-acetaminophen (NORCO) 10-325 MG tablet Take 1 tablet by mouth every 6 (six) hours as needed. 1 to 2 tablets every 24 hrs as needed    . levothyroxine (SYNTHROID, LEVOTHROID) 88 MCG tablet Take 88 mcg by mouth daily.      Marland Kitchen losartan (COZAAR) 50 MG tablet Take 50 mg by mouth 2 (two) times daily.     . Magnesium 500 MG TABS Take by mouth daily.    . pantoprazole (PROTONIX) 40 MG tablet Take 20 mg by mouth 2 (two) times daily.     . predniSONE (DELTASONE) 20 MG tablet Take 40 mg by mouth every other day.     . pyridostigmine (MESTINON) 60 MG tablet Take 120 mg by mouth daily.     . vitamin B-12 (CYANOCOBALAMIN) 1000 MCG tablet Take 1,000 mcg by mouth daily.    . cholestyramine (QUESTRAN) 4 GM/DOSE powder  (Patient not taking: Reported on 03/25/2020)    . ipratropium (ATROVENT) 0.06 % nasal spray Place 2 sprays into both nostrils in the morning and at bedtime. (Patient not taking: Reported on 03/25/2020)    . latanoprost (XALATAN) 0.005 % ophthalmic solution Place 1 drop into both eyes daily. (Patient not taking: Reported on 06/22/2020)  99  . rOPINIRole (REQUIP) 2 MG tablet Take 2 mg by mouth in the morning, at noon, and at bedtime. (Patient not taking: Reported on 06/22/2020)    . tamsulosin (FLOMAX) 0.4 MG CAPS capsule TAKE 1 CAPSULE BY MOUTH ONCE DAILY. TAKE 30 MINUTES AFTER SAME MEAL EACH DAY (Patient not taking: Reported on 03/25/2020)    . Tiotropium Bromide Monohydrate (SPIRIVA RESPIMAT) 1.25 MCG/ACT AERS Inhale 2 puffs into the lungs daily. (Patient not taking: Reported on 06/22/2020) 4 g 0   No current facility-administered medications for this visit.    Past Medical History:  Diagnosis Date  . Arthritis    lower left hip  .  Bilateral hand numbness    from back surgery  . Bronchitis, chronic ([place])   . 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 Hypercholesterolemia   . Hypertension    doesn't have a cardiologist;pt is maintained by medical md for htn;requ[facility] e[facility] from [facility]  . Hypothyroidism    pt takes Levothyroxine daily  . Myasthenia gravis, adult form ([place])   . Shortness of breath    Lung MD- Dr Darlin Coco  . Sleep apnea    do not use CPAP every night    Past Surgical History:  Procedure Laterality Date  . ANTERIOR CERVICAL DECOMP/DISCECTOMY FUSION  07/18/2011   Procedure: ANTERIOR CERVICAL DECOMPRESSION/DISCECTOMY FUSION 2 LEVELS;   Surgeon: Cooper Render Pool;  Location: [place] [facility];  Service: Neurosurgery;  Laterality: N/A;  cervical five-six, cervical six-seven anterior cervical discectomy and fusion  . BACK SURGERY     in [place]     01/2020 Right, 04/2020 Left  . CARDIAC CATHETERIZATION     2005 at [facility], no stents  . CATARACT EXTRACTION W/PHACO Left 01/06/2020   Procedure: CATARACT EXTRACTION PHACO AND INTRAOCULAR LENS PLACEMENT (IOC) ISTENT INJ LEFT 3.81  00:33.3;  Surgeon: Eulogio Bear, MD;  Location: [place];  Service: Ophthalmology;  Laterality: Left;  . CATARACT EXTRACTION W/PHACO Right 02/03/2020   Procedure: CATARACT EXTRACTION PHACO AND INTRAOCULAR LENS PLACEMENT ([place]) RIGHT ISTENT INJ;  Surgeon: Eulogio Bear, MD;  Location: [place];  Service: Ophthalmology;  Laterality: Right;  4.29 0:35.6  . HERNIA REPAIR Left    inguinal hernia repair in 1985  . LUMBAR LAMINECTOMY/DECOMPRESSION MICRODISCECTOMY Left 02/24/2014   Procedure: LUMBAR LAMINECTOMY/DECOMPRESSION MICRODISCECTOMY LUMBAR THREE-FOUR, FOUR-FIVE, LEFT FIVE-SACRAL ONE ;  Surgeon: Charlie Pitter, MD;  Location: [place] [facility];  Service: Neurosurgery;  Laterality: Left;  LUMBAR LAMINECTOMY/DECOMPRESSION MICRODISCECTOMY LUMBAR THREE-FOUR, FOUR-FIVE, LEFT FIVE-SACRAL ONE   . PROSTATECTOMY  8/14   [facility] Dr Mare Ferrari      Social History   Tobacco Use  . Smoking status: Former Smoker    Packs/day: 1.00    Years: 20.00    Pack years: 20.00    Types: Cigarettes    Quit date: 09/12/2001    Years since quitting: 18.8  . Smokeless tobacco: Never Used  Vaping Use  . Vaping Use: Never used  Substance Use Topics  . Alcohol use: Yes    Comment: 7 beverages per week  . Drug use: No     Family History  Problem Relation Age of Onset  . Hypertension Mother   . Stroke Mother   . Stroke Father     Allergies  Allergen Reactions  . Darvon [Propoxyphene]     darvocet  . Azithromycin  Other (See Comments)    Avoid due to myasthenia gravis  . Codeine Nausea And Vomiting    REVIEW OF SYSTEMS(Negative unless checked)  Constitutional: [] ??Weight loss[] ??Fever[] ??Chills Cardiac:[] ??Chest pain[] ??Chest pressure[] ??Palpitations [] ??Shortness of breath when laying flat [] ??Shortness of breath at rest [] ??Shortness of breath with exertion. Vascular: [] ??Pain in legs with walking[] ??Pain in legsat rest[] ??Pain in legs when laying flat [] ??Claudication [] ??Pain in feet when walking [] ??Pain in feet at rest [] ??Pain in feet when laying flat [] ??History of DVT [] ??Phlebitis [] ??Swelling in legs [] ??Varicose veins [] ??Non-healing ulcers Pulmonary: [] ??Uses home oxygen [] ??Productive cough[] ??Hemoptysis [] ??Wheeze [x] ??COPD [] ??Asthma Neurologic: [] ??  Dizziness [] ??Blackouts [] ??Seizures [] ??History of stroke [] ??History of TIA[] ??Aphasia [] ??Temporary blindness[] ??Dysphagia [] ??Weaknessor numbness in arms [] ??Weakness or numbnessin legs Musculoskeletal: [x] ??Arthritis [] ??Joint swelling [x] ??Joint pain [x] ??Low back pain Hematologic:[] ??Easy bruising[] ??Easy bleeding [] ??Hypercoagulable state [x] ??Anemic [] ??Hepatitis Gastrointestinal:[] ??Blood in stool[] ??Vomiting blood[] ??Gastroesophageal reflux/heartburn[] ??Difficulty swallowing. Genitourinary: [] ??Chronic kidney disease [] ??Difficulturination [] ??Frequenturination [] ??Burning with urination[] ??Blood in urine Skin: [] ??Rashes [] ??Ulcers [] ??Wounds Psychological: [] ??History of anxiety[] ??History of major depression.    Physical Examination  BP (!) 110/57 (BP Location: Left Arm)   Pulse 68   Resp 16   Wt 233 lb (105.7 kg)   BMI 33.43 kg/m  Gen:  WD/WN, NAD Head: [place]/AT, No temporalis wasting. Ear/Nose/Throat: Hearing grossly intact, nares w/o erythema or drainage Eyes: Conjunctiva clear. Sclera  non-icteric Neck: Supple.  Trachea midline Pulmonary:  Good air movement, no use of accessory muscles.  Cardiac: RRR, no JVD Vascular:  Vessel Right Left  Radial Palpable Palpable               Musculoskeletal: Diffuse LE weakness.  Walks with a walker. No deformity or atrophy. Trace LE edema. Neurologic: Sensation grossly intact in extremities.  Symmetrical.  Speech is fluent.  Psychiatric: Judgment intact, Mood & affect appropriate for pt's clinical situation. Dermatologic: No rashes or ulcers noted.  No cellulitis or open wounds.       Labs Recent Results (from the past 2160 hour(s))  I-STAT creatinine     Status: Abnormal   Collection Time: 07/08/20  2:49 PM  Result Value Ref Range   Creatinine, Ser 1.50 (H) 0.61 - 1.24 mg/dL    Radiology CT ANGIO HEAD W OR WO CONTRAST  Result Date: 07/08/2020 CLINICAL DATA:  Carotid stenosis. EXAM: CT ANGIOGRAPHY HEAD AND NECK TECHNIQUE: Multidetector CT imaging of the head and neck was performed using the standard protocol during bolus administration of intravenous contrast. Multiplanar CT image reconstructions and MIPs were obtained to evaluate the vascular anatomy. Carotid stenosis measurements (when applicable) are obtained utilizing NASCET criteria, using the distal internal carotid diameter as the denominator. CONTRAST:  58mL OMNIPAQUE IOHEXOL 350 MG/ML SOLN COMPARISON:  MRI of the brain June 17, 2020. FINDINGS: CT HEAD FINDINGS Brain: No evidence of acute infarction, hemorrhage, hydrocephalus, extra-axial collection or mass lesion/mass effect. Vascular: Calcified plaques the bilateral carotid siphons. Skull: Normal. Negative for fracture or focal lesion. Sinuses: Imaged portions are clear. Orbits: No acute finding. Review of the MIP images confirms the above findings CTA NECK FINDINGS Aortic arch: Standard branching. Imaged portion shows no evidence of aneurysm or dissection. Atherosclerotic plaques in the aortic arch with heavily  calcified plaque at the origin of the left subclavian artery, resulting in mild stenosis. Right carotid system: Prominent atherosclerotic plaque at the right carotid bifurcation with mixed density, predominantly soft. Although the plaque only results in approximately 50% stenosis, a large plaque ulceration is seen. Increased tortuosity of the distal cervical segment of the right ICA. Left carotid system: Atherosclerotic disease of the left carotid bifurcation with mixed density plaque, predominantly soft, without thickened stenosis. Mildly increased tortuosity of the cervical segment of the left ICA. Vertebral arteries: Codominant. No evidence of dissection, stenosis (50% or greater) or occlusion. Skeleton: Degenerative changes of the cervical spine status post C5, C6, C7 ACDF. Degenerative changes of the bilateral sternoclavicular joints. No aggressive bone lesion identified. Other neck: A few calcific foci within the right parotid gland. Upper chest: No acute findings. Review of the MIP images confirms the above findings CTA HEAD FINDINGS Anterior circulation: A 2-3 mm outpouching projecting posteriorly from the supraclinoid right ICA, may  represent an infundibulum versus aneurysm. Calcified plaques in the bilateral carotid siphons without hemodynamically significant stenosis. The MCA and ACA vascular trees are preserved. Posterior circulation: No significant stenosis, proximal occlusion, aneurysm, or vascular malformation. Increased tortuosity of the basilar artery Venous sinuses: As permitted by contrast timing, patent. Anatomic variants: None significant. Review of the MIP images confirms the above findings IMPRESSION: 1. Prominent mixed density atherosclerotic plaque at the right carotid bifurcation. Although the plaque only results in approximately 50% stenosis, a large plaque ulceration is seen. 2. Atherosclerotic disease of the left carotid bifurcation without hemodynamically significant stenosis. 3. A 2-3  mm outpouching projecting posteriorly from the supraclinoid right ICA, may represent an infundibulum versus aneurysm. Aortic Atherosclerosis (ICD10-I70.0). Electronically Signed   By: Pedro Earls M.D.   On: 07/08/2020 21:28   CT ANGIO NECK W OR WO CONTRAST  Result Date: 07/08/2020 CLINICAL DATA:  Carotid stenosis. EXAM: CT ANGIOGRAPHY HEAD AND NECK TECHNIQUE: Multidetector CT imaging of the head and neck was performed using the standard protocol during bolus administration of intravenous contrast. Multiplanar CT image reconstructions and MIPs were obtained to evaluate the vascular anatomy. Carotid stenosis measurements (when applicable) are obtained utilizing NASCET criteria, using the distal internal carotid diameter as the denominator. CONTRAST:  39mL OMNIPAQUE IOHEXOL 350 MG/ML SOLN COMPARISON:  MRI of the brain June 17, 2020. FINDINGS: CT HEAD FINDINGS Brain: No evidence of acute infarction, hemorrhage, hydrocephalus, extra-axial collection or mass lesion/mass effect. Vascular: Calcified plaques the bilateral carotid siphons. Skull: Normal. Negative for fracture or focal lesion. Sinuses: Imaged portions are clear. Orbits: No acute finding. Review of the MIP images confirms the above findings CTA NECK FINDINGS Aortic arch: Standard branching. Imaged portion shows no evidence of aneurysm or dissection. Atherosclerotic plaques in the aortic arch with heavily calcified plaque at the origin of the left subclavian artery, resulting in mild stenosis. Right carotid system: Prominent atherosclerotic plaque at the right carotid bifurcation with mixed density, predominantly soft. Although the plaque only results in approximately 50% stenosis, a large plaque ulceration is seen. Increased tortuosity of the distal cervical segment of the right ICA. Left carotid system: Atherosclerotic disease of the left carotid bifurcation with mixed density plaque, predominantly soft, without thickened stenosis.  Mildly increased tortuosity of the cervical segment of the left ICA. Vertebral arteries: Codominant. No evidence of dissection, stenosis (50% or greater) or occlusion. Skeleton: Degenerative changes of the cervical spine status post C5, C6, C7 ACDF. Degenerative changes of the bilateral sternoclavicular joints. No aggressive bone lesion identified. Other neck: A few calcific foci within the right parotid gland. Upper chest: No acute findings. Review of the MIP images confirms the above findings CTA HEAD FINDINGS Anterior circulation: A 2-3 mm outpouching projecting posteriorly from the supraclinoid right ICA, may represent an infundibulum versus aneurysm. Calcified plaques in the bilateral carotid siphons without hemodynamically significant stenosis. The MCA and ACA vascular trees are preserved. Posterior circulation: No significant stenosis, proximal occlusion, aneurysm, or vascular malformation. Increased tortuosity of the basilar artery Venous sinuses: As permitted by contrast timing, patent. Anatomic variants: None significant. Review of the MIP images confirms the above findings IMPRESSION: 1. Prominent mixed density atherosclerotic plaque at the right carotid bifurcation. Although the plaque only results in approximately 50% stenosis, a large plaque ulceration is seen. 2. Atherosclerotic disease of the left carotid bifurcation without hemodynamically significant stenosis. 3. A 2-3 mm outpouching projecting posteriorly from the supraclinoid right ICA, may represent an infundibulum versus aneurysm. Aortic Atherosclerosis (ICD10-I70.0). Electronically Signed  By: Pedro Earls M.D.   On: 07/08/2020 21:28   MR BRAIN WO CONTRAST  Result Date: 06/17/2020 CLINICAL DATA:  74 year old male with left side weakness and numbness in the extremities. Worsening balance. Falls at home. Symptoms x2 months. EXAM: MRI HEAD WITHOUT CONTRAST TECHNIQUE: Multiplanar, multiecho pulse sequences of the brain and  surrounding structures were obtained without intravenous contrast. COMPARISON:  Head CT 05/11/2012. FINDINGS: Brain: No restricted diffusion to suggest acute infarction. No midline shift, mass effect, evidence of mass lesion, ventriculomegaly, extra-axial collection or acute intracranial hemorrhage. Cervicomedullary junction and pituitary are within normal limits. Cerebral volume is within normal limits for age. Largely normal for age gray and white matter signal throughout the brain. There is perhaps a small area of focal cortical encephalomalacia in the anterior right frontal lobe on series 15, image 39. And there is a nearby chronic microhemorrhage seen on series 13, image 45. No other cortical encephalomalacia identified. Minimal for age nonspecific cerebral white matter signal changes. There is mild to moderate patchy T2 and FLAIR hyperintensity in the central pons (series 15, image 19). Deep gray nuclei and cerebellum are within normal limits. Vascular: Major intracranial vascular flow voids are preserved. Tortuous vertebrobasilar system. Skull and upper cervical spine: Normal visible cervical spine, bone marrow signal. Sinuses/Orbits: Postoperative changes to both globes, otherwise negative orbits. Minimal right maxillary sinus mucosal thickening. Other: Mastoids are clear. Visible internal auditory structures appear normal. Stylomastoid foramen appear normal. Scalp and face appear negative. IMPRESSION: 1.  No acute intracranial abnormality. 2. Small area of chronic cortical ischemia suspected in the anterior right frontal lobe, with a nearby chronic microhemorrhage. And up to moderate signal changes in the pons which are nonspecific but most commonly due to small vessel disease. These results will be called to the ordering clinician or representative by the Radiology Department at the imaging location. Electronically Signed   By: Genevie Ann M.D.   On: 06/17/2020 12:37   VAS US CAROTID  Result Date:  06/22/2020 Carotid Arterial Duplex Study Indications:       Carotid artery stenosis. Comparison Study:  06/06/2019 Performing Technologist: Charlane Ferretti RT (R)(VS)  Examination Guidelines: A complete evaluation includes B-mode imaging, spectral Doppler, color Doppler, and power Doppler as needed of all accessible portions of each vessel. Bilateral testing is considered an integral part of a complete examination. Limited examinations for reoccurring indications may be performed as noted.  Right Carotid Findings: +----------+--------+--------+--------+------------------+--------------------+           PSV cm/sEDV cm/sStenosisPlaque DescriptionComments             +----------+--------+--------+--------+------------------+--------------------+ CCA Prox  103     19                                                     +----------+--------+--------+--------+------------------+--------------------+ CCA Mid   100     16                                                     +----------+--------+--------+--------+------------------+--------------------+ CCA Distal245     42              calcific                               +----------+--------+--------+--------+------------------+--------------------+  ICA Prox  437     24              calcific          ICA/CCA ratio = 4.24 +----------+--------+--------+--------+------------------+--------------------+ ICA Mid   321     21                                                     +----------+--------+--------+--------+------------------+--------------------+ ICA Distal226     11                                                     +----------+--------+--------+--------+------------------+--------------------+ ECA       362     29              calcific                               +----------+--------+--------+--------+------------------+--------------------+ +----------+--------+-------+-----------+-------------------+            PSV cm/sEDV cmsDescribe   Arm Pressure (mmHG) +----------+--------+-------+-----------+-------------------+ Subclavian160            Multiphasic                    +----------+--------+-------+-----------+-------------------+ +---------+--------+--+--------+-+---------+ VertebralPSV cm/s49EDV cm/s6Antegrade +---------+--------+--+--------+-+---------+ Left Carotid Findings: +----------+--------+--------+--------+------------------+--------------------+           PSV cm/sEDV cm/sStenosisPlaque DescriptionComments             +----------+--------+--------+--------+------------------+--------------------+ CCA Prox  356     23                                                     +----------+--------+--------+--------+------------------+--------------------+ CCA Mid   102     20                                                     +----------+--------+--------+--------+------------------+--------------------+ CCA Distal184     27              calcific                               +----------+--------+--------+--------+------------------+--------------------+ ICA Prox  210     17              calcific          ICA/CCA ratio = 1.14 +----------+--------+--------+--------+------------------+--------------------+ ICA Mid   173     27                                                     +----------+--------+--------+--------+------------------+--------------------+ ICA Distal93      27                                                     +----------+--------+--------+--------+------------------+--------------------+  ECA       345     15                                                     +----------+--------+--------+--------+------------------+--------------------+ +----------+--------+--------+-----------+-------------------+           PSV cm/sEDV cm/sDescribe   Arm Pressure (mmHG) +----------+--------+--------+-----------+-------------------+  Subclavian218             Multiphasic                    +----------+--------+--------+-----------+-------------------+ +---------+--------+--+--------+--+---------+ VertebralPSV cm/s60EDV cm/s13Antegrade +---------+--------+--+--------+--+---------+ Summary: Right Carotid: Velocities in the right ICA are consistent with a 80-99%                stenosis. Non-hemodynamically significant plaque <50% noted in                the CCA. The ECA appears >50% stenosed. Right carotid velocities                suggest 80-99% stenosis. The ECA appears >50% stenosed. Left Carotid: Velocities in the left ICA are consistent with a 40-59% stenosis.               Non-hemodynamically significant plaque <50% noted in the CCA. The               ECA appears >50% stenosed. The left carotid velocities suggestt               40-59% stenosis. The ECA appears >50% stenosed. Vertebrals:  Bilateral vertebral arteries demonstrate antegrade flow. Subclavians: Normal flow hemodynamics were seen in bilateral subclavian              arteries. *See table(s) above for measurements and observations.  Electronically signed by Hortencia Pilar MD on 06/22/2020 at 4:50:45 PM.    Final     Assessment/Plan COPD (chronic obstructive pulmonary disease) Followed by Pulmonology  Essential hypertension, benign blood pressure control important in reducing the progression of atherosclerotic disease. On appropriate oral medications.  Pure hypercholesterolemia lipid control important in reducing the progression of atherosclerotic disease. Continue statin therapy   Bilateral carotid artery disease ([place]) I have independently reviewed his CT angiogram of the neck.  I would respectfully disagree with the radiologist interpretation of a 50% stenosis.  This appear clearly more than that and I would gauge it at least in the 75 to 80% range.  I would agree with her assessment of a very large ulcerative lesion as there is quite a large ulcer.  My  interpretation would correlate more with the carotid duplex which also suggested a high-grade stenosis. I had a long discussion with he and his wife today.  I have offered him 2 different options.  One would be a catheter-based angiogram for definitive diagnosis and then likely carotid endarterectomy following this.  His anatomy is not optimal for stenting due to the large ulceration and difficult aortic arch, but carotid stenting could be considered at the time of diagnostic angiogram if more amenable disease is seen.  The other option would be to go ahead and proceed with a right carotid endarterectomy.  He would prefer the latter option.  Particularly with his renal dysfunction, that is probably the safest thing to do.  The patient and his wife voiced their understanding and desire to proceed with  right carotid endarterectomy.  This was scheduled in the near future at his convenience.   Leg weakness, bilateral I am happy to assess him for vascular disease in the lower extremities once we have treated his carotid disease.    Leotis Pain, MD  07/13/2020 11:56 AM    This note was created with Dragon medical transcription system.  Any errors from dictation are purely unintentional

## 2020-07-13 DIAGNOSIS — R29898 Other symptoms and signs involving the musculoskeletal system: Secondary | ICD-10-CM | POA: Insufficient documentation

## 2020-07-13 NOTE — Assessment & Plan Note (Signed)
lipid control important in reducing the progression of atherosclerotic disease. Continue statin therapy  

## 2020-07-13 NOTE — Assessment & Plan Note (Signed)
I am happy to assess him for vascular disease in the lower extremities once we have treated his carotid disease.

## 2020-07-13 NOTE — Assessment & Plan Note (Signed)
I have independently reviewed his CT angiogram of the neck.  I would respectfully disagree with the radiologist interpretation of a 50% stenosis.  This appear clearly more than that and I would gauge it at least in the 75 to 80% range.  I would agree with her assessment of a very large ulcerative lesion as there is quite a large ulcer.  My interpretation would correlate more with the carotid duplex which also suggested a high-grade stenosis. I had a long discussion with he and his wife today.  I have offered him 2 different options.  One would be a catheter-based angiogram for definitive diagnosis and then likely carotid endarterectomy following this.  His anatomy is not optimal for stenting due to the large ulceration and difficult aortic arch, but carotid stenting could be considered at the time of diagnostic angiogram if more amenable disease is seen.  The other option would be to go ahead and proceed with a right carotid endarterectomy.  He would prefer the latter option.  Particularly with his renal dysfunction, that is probably the safest thing to do.  The patient and his wife voiced their understanding and desire to proceed with right carotid endarterectomy.  This was scheduled in the near future at his convenience.

## 2020-07-17 DIAGNOSIS — Z01810 Encounter for preprocedural cardiovascular examination: Secondary | ICD-10-CM | POA: Insufficient documentation

## 2020-07-17 DIAGNOSIS — R0602 Shortness of breath: Secondary | ICD-10-CM | POA: Insufficient documentation

## 2020-07-22 ENCOUNTER — Ambulatory Visit: Payer: Medicare Other

## 2020-07-23 ENCOUNTER — Other Ambulatory Visit: Payer: Self-pay | Admitting: Neurosurgery

## 2020-07-29 ENCOUNTER — Telehealth (INDEPENDENT_AMBULATORY_CARE_PROVIDER_SITE_OTHER): Payer: Self-pay

## 2020-07-29 NOTE — Telephone Encounter (Signed)
I attempted to contact the patient and due to the mail box being full I was unable to leave a message.

## 2020-07-30 ENCOUNTER — Encounter
Admission: RE | Admit: 2020-07-30 | Discharge: 2020-07-30 | Disposition: A | Discharge status: Home / Self Care | Payer: Medicare Other | Source: Ambulatory Visit | Attending: Neurosurgery | Admitting: Neurosurgery

## 2020-07-30 ENCOUNTER — Other Ambulatory Visit: Payer: Self-pay

## 2020-07-30 DIAGNOSIS — Z01812 Encounter for preprocedural laboratory examination: Secondary | ICD-10-CM | POA: Insufficient documentation

## 2020-07-30 LAB — SURGICAL PCR SCREEN
MRSA, PCR: NEGATIVE
Staphylococcus aureus: NEGATIVE

## 2020-07-30 LAB — APTT: aPTT: 25 seconds (ref 24–36)

## 2020-07-30 LAB — URINALYSIS, ROUTINE W REFLEX MICROSCOPIC
Bilirubin Urine: NEGATIVE
Glucose, UA: NEGATIVE mg/dL
Hgb urine dipstick: NEGATIVE
Ketones, ur: NEGATIVE mg/dL
Leukocytes,Ua: NEGATIVE
Nitrite: NEGATIVE
Protein, ur: NEGATIVE mg/dL
Specific Gravity, Urine: 1.011 (ref 1.005–1.030)
pH: 6 (ref 5.0–8.0)

## 2020-07-30 LAB — CBC
HCT: 32.7 % — ABNORMAL LOW (ref 39.0–52.0)
Hemoglobin: 10.4 g/dL — ABNORMAL LOW (ref 13.0–17.0)
MCH: 34.1 pg — ABNORMAL HIGH (ref 26.0–34.0)
MCHC: 31.8 g/dL (ref 30.0–36.0)
MCV: 107.2 fL — ABNORMAL HIGH (ref 80.0–100.0)
Platelets: 265 10*3/uL (ref 150–400)
RBC: 3.05 MIL/uL — ABNORMAL LOW (ref 4.22–5.81)
RDW: 13.9 % (ref 11.5–15.5)
WBC: 10.7 10*3/uL — ABNORMAL HIGH (ref 4.0–10.5)
nRBC: 0 % (ref 0.0–0.2)

## 2020-07-30 LAB — BASIC METABOLIC PANEL
Anion gap: 10 (ref 5–15)
BUN: 43 mg/dL — ABNORMAL HIGH (ref 8–23)
CO2: 28 mmol/L (ref 22–32)
Calcium: 8.6 mg/dL — ABNORMAL LOW (ref 8.9–10.3)
Chloride: 101 mmol/L (ref 98–111)
Creatinine, Ser: 1.39 mg/dL — ABNORMAL HIGH (ref 0.61–1.24)
GFR, Estimated: 53 mL/min — ABNORMAL LOW (ref >60)
Glucose, Bld: 110 mg/dL — ABNORMAL HIGH (ref 70–99)
Potassium: 4.8 mmol/L (ref 3.5–5.1)
Sodium: 139 mmol/L (ref 135–145)

## 2020-07-30 LAB — TYPE AND SCREEN
ABO/RH(D): B NEG
Antibody Screen: NEGATIVE

## 2020-07-30 LAB — PROTIME-INR
INR: 0.9 (ref 0.8–1.2)
Prothrombin Time: 11.8 seconds (ref 11.4–15.2)

## 2020-07-30 NOTE — Patient Instructions (Addendum)
Your procedure is scheduled on:08/07/20- Friday Report to the Registration Desk on the 1st floor of the [facility]. To find out your arrival time, please call [phone] between 1PM - 3PM on: 08/05/20- Wednesday  REMEMBER: Instructions that are not followed completely may result in serious medical risk, up to and including death; or upon the discretion of your surgeon and anesthesiologist your surgery may need to be rescheduled.  Do not eat food after midnight the night before surgery.  No gum chewing, lozengers or hard candies.  You may however, drink CLEAR liquids up to 2 hours before you are scheduled to arrive for your surgery. Do not drink anything within 2 hours of your scheduled arrival time.  Clear liquids include: - water  - apple juice without pulp - gatorade (not RED, PURPLE, OR BLUE) - black coffee or tea (Do NOT add milk or creamers to the coffee or tea) Do NOT drink anything that is not on this list.   TAKE THESE MEDICATIONS THE MORNING OF SURGERY WITH A SIP OF WATER: 1. azaTHIOprine (IMURAN) 50 MG tablet 2. gabapentin (NEURONTIN) 300 MG capsule 3. levothyroxine (SYNTHROID, LEVOTHROID) 88 MCG tablet 4.oxybutynin (DITROPAN) 5 MG tablet 5.pantoprazole (PROTONIX) 40 MG tablet, take one the night before and one on the morning of surgery - helps to prevent nausea after surgery. 6.pyridostigmine (MESTINON) 60 MG tablet  7. HYDROcodone-acetaminophen (NORCO) 10-325 MG tablet if needed.   Follow recommendations from Cardiologist, Pulmonologist or PCP regarding stopping Aspirin, stop taking on 07/31/20 and do restart until 2 weeks after procedure, 08/22/20.  One week prior to surgery: Stop Anti-inflammatories (NSAIDS) such as Advil, Aleve, Ibuprofen, Motrin, Naproxen, Naprosyn and Aspirin based products such as Excedrin, Goodys Powder, BC Powder.   Stop ANY OVER THE COUNTER supplements until after surgery. (However, you may continue taking Vitamin D, Vitamin B, and  multivitamin up until the day before surgery.)  No Alcohol for 24 hours before or after surgery.  No Smoking including e-cigarettes for 24 hours prior to surgery.  No chewable tobacco products for at least 6 hours prior to surgery.  No nicotine patches on the day of surgery.  Do not use any "recreational" drugs for at least a week prior to your surgery.  Please be advised that the combination of cocaine and anesthesia may have negative outcomes, up to and including death. If you test positive for cocaine, your surgery will be cancelled.  On the morning of surgery brush your teeth with toothpaste and water, you may rinse your mouth with mouthwash if you wish. Do not swallow any toothpaste or mouthwash.  Do not wear jewelry, make-up, hairpins, clips or nail polish.  Do not wear lotions, powders, or perfumes.   Do not shave body from the neck down 48 hours prior to surgery just in case you cut yourself which could leave a site for infection.  Also, freshly shaved skin may become irritated if using the CHG soap.  Contact lenses, hearing aids and dentures may not be worn into surgery.  Do not bring valuables to the hospital. [facility] is not responsible for any missing/lost belongings or valuables.   Use CHG Soap or wipes as directed on instruction sheet.  Notify your doctor if there is any change in your medical condition (cold, fever, infection).  Wear comfortable clothing (specific to your surgery type) to the hospital.  Plan for stool softeners for home use; pain medications have a tendency to cause constipation. You can also help prevent constipation by  eating foods high in fiber such as fruits and vegetables and drinking plenty of fluids as your diet allows.  After surgery, you can help prevent lung complications by doing breathing exercises.  Take deep breaths and cough every 1-2 hours. Your doctor may order a device called an Incentive Spirometer to help you take deep  breaths. When coughing or sneezing, hold a pillow firmly against your incision with both hands. This is called "splinting." Doing this helps protect your incision. It also decreases belly discomfort.  If you are being admitted to the hospital overnight, leave your suitcase in the car. After surgery it may be brought to your room.  If you are being discharged the day of surgery, you will not be allowed to drive home. You will need a responsible adult (18 years or older) to drive you home and stay with you that night.   If you are taking public transportation, you will need to have a responsible adult (18 years or older) with you. Please confirm with your physician that it is acceptable to use public transportation.   Please call the [facility] at [phone] if you have any questions about these instructions.  Visitation Policy:  Patients undergoing a surgery or procedure may have one family member or support person with them as long as that person is not COVID-19 positive or experiencing its symptoms.  That person may remain in the waiting area during the procedure.  Inpatient Visitation Update:   In an effort to ensure the safety of our team members and our patients, we are implementing a change to our visitation policy:  Effective Monday, Aug. 9, at 7 a.m., inpatients will be allowed one support person.  o The support person may change daily.  o The support person must pass our screening, gel in and out, and wear a mask at all times, including in the patient's room.  o Patients must also wear a mask when staff or their support person are in the room.  o Masking is required regardless of vaccination status.  Systemwide, no visitors 17 or younger.

## 2020-07-31 ENCOUNTER — Encounter: Payer: Self-pay | Admitting: Neurosurgery

## 2020-07-31 NOTE — Progress Notes (Signed)
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 Clinical nursing staff has updated patient's PMH/PSHx, current medication list, and drug allergies/intolerances to ensure comprehensive history available to assist in medical decision making as it pertains to the aforementioned surgical procedure and anticipated anesthetic course.   Clinical Discussion:  Francisco [name] is a 74 y.o. male who is submitted for pre-surgical anesthesia review and clearance prior to him undergoing the above procedure.Patient is a Former Smoker (20 pack years; quit 09/2001). Pertinent PMH includes: atypical angina, PAD, carotid stenosis, HTN, HLD, COPD, myasthenia gravis, hypothyroidism, CKD-III, OSAH (requires nocturnal PAP therapy), OA, cervical DDD, lumbosacral spinal stenosis.  Patient is followed by cardiology Saralyn Pilar, MD). He was last seen in the cardiology clinic on 07/17/2020; notes reviewed.  At the time of his clinic visit, patient denied any chest pain.  He has chronic exertional dyspnea due to his underlying COPD diagnosis.  Patient denies orthopnea, PND, palpitations, vertiginous symptoms, and presyncope/syncope.  He notes chronic peripheral edema.  Patient is not active, does not exercise regularly.  Patient with ambulation difficulties due to lower extremity weakness (L >R).  ECG performed in the office revealed sinus rhythm at a rate of 78 bpm with nonspecific  ST-T wave abnormalities.  Blood pressure elevated in the office at 152/60.  Patient is on diuretic and ARB medications for his HTN.  He advised cardiology follows a DASH diet.  Myoview in 2007 revealed a fixed inferior defect with preserved LVEF.  Last TTE in 05/2018 revealed normal left ventricular systolic function with an LVEF of 55% (see full interpretation of cardiovascular testing below).  Additionally, patient noting that he has had a previous diagnostic LHC that revealed no significant abnormalities; records unavailable.  Given patient's past medical history and current symptomology, cardiologist recommending repeat TTE and myocardial perfusion imaging study.  Patient follow-up with outpatient cardiology following the aforementioned cardiovascular testing.    Per cardiology practice Estill Bamberg, CMA), patient has nuclear stress test and echocardiogram scheduled and clearance for this patient until after that testing has been performed and Dr. Saralyn Pilar has seen him in follow up. TTE and Lexiscan, at this time is scheduled for 08/24/2020 with follow up with cardiologist on 08/26/2020. Patient and wife upset and asking that appointments be changed. Collaborative multidisciplinary communication between care providers spoke to the urgency of upcoming surgical procedures. Cardiology service able to work patient in for urgent testing on 08/04/2020.   Patient was seen in follow-up consult on 08/05/2020 by cardiology; notes reviewed.  Patient denied any angina or anginal equivalent symptoms.  He continues to experience mild peripheral edema that resolves at night with elevation of his lower extremities.  He has not been very active due to patient report extremity pain weakness.  Lexiscan performed on 08/04/2020 revealed normal left ventricular systolic function with no evidence of scar or ischemia; LVEF normal.  TTE performed today (08/05/2020 reveals normal left ventricular systolic function with an LVEF of  55-60% with no regional wall motion abnormalities  and mild mitral valve regurgitation.  No changes were made to patient's medication regimen.  Recommended heart healthy/DASH diet.  No further cardiac diagnostics planned at this time.  Patient scheduled to RTC as needed.  Patient is scheduled to undergo two separate surgical courses in the near future.  Patient needs to have carotid endarterectomy and neurosurgical procedure.  Neurosurgeon and vascular surgeon have collaboratively discussed this patient.  Specialty physicians have agreed that neurosurgical procedure should be performed first as plans are for carotid stent to be placed, which will require DAPT (ASA + clopidogrel) therapy.  Patient scheduled for a multilevel posterior cervical fusion with decompression on 08/07/2020 with Dr. Meade Maw.  Again, given patient's past medical history, presurgical clearances are felt to be necessary by the PAT team.  Specialty clearances for this patient obtained and placed on the this chart as follows:   Per cardiology, "patient is optimized for surgery.  He may proceed with C3-6 fusion and decompression and right carotid endarterectomy if deemed medically necessary with an overall LOW risk for cardiovascular complications". This patient is on daily antiplatelet therapy. He has been instructed on recommendations for holding his daily low-dose ASA for 1 week prior to his procedure with plans to resume 2 weeks postoperatively.   Per neurology, "patient has a high risk neurological complication if no surgical intervention is performed. He may proceed with planned surgical intervention with a LOW risk stratification".   He denies previous perioperative complications with anesthesia. He underwent a MAC anesthetic course [place] (ASA III) in 01/2020 with no documented complications.   Vitals with BMI 07/30/2020 07/10/2020 06/22/2020  Height _0  - -  Weight 232 lbs 233 lbs 235 lbs 6 oz  BMI  94.49 - -  Systolic - 675 916  Diastolic - 57 53  Pulse 89 68 65    Providers/Specialists:   NOTE: Primary physician provider listed below. Patient may have been seen by APP or partner within same practice.   PROVIDER ROLE LAST Dola Factor, MD Neurosurgery  07/17/2020  Adin Hector, MD Primary Care Provider  07/29/2020  Isaias Cowman, MD Cardiology  07/17/2020  Leotis Pain, MD Vascular Surgery  07/10/2020  Jennings Books, MD Neurology  07/15/2020  Ottie Glazier, MD Pulmonary Medicine  03/31/2020   Allergies:  Azithromycin and Codeine  Current Home Medications:   No current facility-administered medications for this encounter.   Marland Kitchen aspirin 81 MG tablet  . azaTHIOprine (IMURAN) 50 MG tablet  . furosemide (LASIX) 20 MG tablet  . gabapentin (NEURONTIN) 300 MG capsule  . hydrochlorothiazide (HYDRODIURIL) 25 MG tablet  . HYDROcodone-acetaminophen (NORCO) 10-325 MG tablet  . levothyroxine (SYNTHROID, LEVOTHROID) 88 MCG tablet  . losartan (COZAAR) 50 MG tablet  . Magnesium 500 MG TABS  . oxybutynin (DITROPAN) 5 MG tablet  . pantoprazole (PROTONIX) 40 MG tablet  . predniSONE (DELTASONE) 20 MG tablet  . pyridostigmine (MESTINON) 60 MG tablet  . rOPINIRole (REQUIP) 4 MG tablet  . vitamin B-12 (CYANOCOBALAMIN) 1000 MCG tablet   History:   Past Medical History:  Diagnosis Date  . Arthritis    lower left hip  . Atypical angina ([place])   . Bilateral hand numbness    from back surgery  . Bronchitis, chronic ([place])   . Cancer [employer])    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; Merkel cell cancer, and Basal cell cancer (twice; 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  . Carotid stenosis   . CKD (chronic kidney disease) stage 3, GFR 30-59 ml/min (  [place])   . COPD (chronic obstructive pulmonary disease) ([place])    stage 2  . DDD (degenerative disc disease), cervical   . Hypercholesterolemia   . Hypertension   . Hypothyroidism    pt takes Levothyroxine daily  . Lumbosacral spinal stenosis   .  Myasthenia gravis, adult form ([place])   . PAD (peripheral artery disease) ([place])   . Shortness of breath    Lung MD- Dr Darlin Coco  . Sleep apnea    do not use CPAP every night   Past Surgical History:  Procedure Laterality Date  . ANTERIOR CERVICAL DECOMP/DISCECTOMY FUSION  07/18/2011   Procedure: ANTERIOR CERVICAL DECOMPRESSION/DISCECTOMY FUSION 2 LEVELS;  Surgeon: Cooper Render Pool;  Location: [facility];  Service: Neurosurgery;  Laterality: N/A;  cervical five-six, cervical six-seven anterior cervical discectomy and fusion  . BACK SURGERY     in [place]     01/2020 Right, 04/2020 Left  . CARDIAC CATHETERIZATION     2005 at [facility], no stents  . CATARACT EXTRACTION W/PHACO Left 01/06/2020   Procedure: CATARACT EXTRACTION PHACO AND INTRAOCULAR LENS PLACEMENT (IOC) ISTENT INJ LEFT 3.81  00:33.3;  Surgeon: Eulogio Bear, MD;  Location: [place];  Service: Ophthalmology;  Laterality: Left;  . CATARACT EXTRACTION W/PHACO Right 02/03/2020   Procedure: CATARACT EXTRACTION PHACO AND INTRAOCULAR LENS PLACEMENT ([place]) RIGHT ISTENT INJ;  Surgeon: Eulogio Bear, MD;  Location: [place];  Service: Ophthalmology;  Laterality: Right;  4.29 0:35.6  . COLONOSCOPY    . HERNIA REPAIR Left    inguinal hernia repair in 1985  . LUMBAR LAMINECTOMY/DECOMPRESSION MICRODISCECTOMY Left 02/24/2014   Procedure: LUMBAR LAMINECTOMY/DECOMPRESSION MICRODISCECTOMY LUMBAR THREE-FOUR, FOUR-FIVE, LEFT FIVE-SACRAL ONE ;  Surgeon: Charlie Pitter, MD;  Location: [facility] [facility];  Service: Neurosurgery;  Laterality: Left;  LUMBAR LAMINECTOMY/DECOMPRESSION MICRODISCECTOMY LUMBAR THREE-FOUR, FOUR-FIVE, LEFT FIVE-SACRAL ONE   . PROSTATECTOMY  8/14   [facility] Dr Mare Ferrari    Family History  Problem Relation Age of Onset  . Hypertension Mother   . Stroke Mother   . Stroke Father    Social History   Tobacco Use  . Smoking status: Former Smoker    Packs/day:  1.00    Years: 20.00    Pack years: 20.00    Types: Cigarettes    Quit date: 09/12/2001    Years since quitting: 18.9  . Smokeless tobacco: Never Used  Vaping Use  . Vaping Use: Never used  Substance Use Topics  . Alcohol use: Yes    Comment: 7 beverages per week  . Drug use: No    Pertinent Clinical Results:  LABS: Labs reviewed: Acceptable for surgery.  Hospital Outpatient Visit on 08/05/2020  Component Date Value Ref Range Status  . S' Lateral 08/05/2020 2.35  cm Final  Hospital Outpatient Visit on 08/04/2020  Component Date Value Ref Range Status  . Rest HR 08/04/2020 64  bpm In process  . Rest BP 08/04/2020 163/71  mmHg In process  . Exercise duration (sec) 08/04/2020 51  sec In process  . Percent HR 08/04/2020 68  % In process  . Estimated workload 08/04/2020 1.0  METS In process  . Peak HR 08/04/2020 99  bpm In process  . Peak BP 08/04/2020 130/56  mmHg In process  . MPHR 08/04/2020 165  bpm In process  . SSS 08/04/2020 3   In process  . [facility] 08/04/2020 4   In process  .  SDS 08/04/2020 1   In process  . TID 08/04/2020 1.22   In process  . LV sys vol 08/04/2020 26  mL In process  . LV dias vol 08/04/2020 81  62 - 150 mL In process  Hospital Outpatient Visit on 07/30/2020  Component Date Value Ref Range Status  . aPTT 07/30/2020 25  24 - 36 seconds Final   Performed at [facility], [address] [place], [place] [address]  . Sodium 07/30/2020 139  135 - 145 mmol/L Final  . Potassium 07/30/2020 4.8  3.5 - 5.1 mmol/L Final  . Chloride 07/30/2020 101  98 - 111 mmol/L Final  . CO2 07/30/2020 28  22 - 32 mmol/L Final  . Glucose, Bld 07/30/2020 110* 70 - 99 mg/dL Final   Glucose reference range applies only to samples taken after fasting for at least 8 hours.  . BUN 07/30/2020 43* 8 - 23 mg/dL Final  . Creatinine, Ser 07/30/2020 1.39* 0.61 - 1.24 mg/dL Final  . Calcium 07/30/2020 8.6* 8.9 - 10.3 mg/dL Final  . GFR, Estimated 07/30/2020 53* >60 mL/min Final     Comment: (NOTE) Calculated using the CKD-EPI Creatinine Equation (2021)   . Anion gap 07/30/2020 10  5 - 15 Final   Performed at [facility], [address] [place], [place] [address]  . WBC 07/30/2020 10.7* 4.0 - 10.5 K/uL Final  . RBC 07/30/2020 3.05* 4.22 - 5.81 MIL/uL Final  . Hemoglobin 07/30/2020 10.4* 13.0 - 17.0 g/dL Final  . HCT 07/30/2020 32.7* 39 - 52 % Final  . MCV 07/30/2020 107.2* 80.0 - 100.0 fL Final  . MCH 07/30/2020 34.1* 26.0 - 34.0 pg Final  . MCHC 07/30/2020 31.8  30.0 - 36.0 g/dL Final  . RDW 07/30/2020 13.9  11.5 - 15.5 % Final  . Platelets 07/30/2020 265  150 - 400 K/uL Final  . nRBC 07/30/2020 0.0  0.0 - 0.2 % Final   Performed at [facility], [address] [place], [place] [address]  . Prothrombin Time 07/30/2020 11.8  11.4 - 15.2 seconds Final  . INR 07/30/2020 0.9  0.8 - 1.2 Final   Comment: (NOTE) INR goal varies based on device and disease states. Performed at [facility], [address] [place], [place] [address]   . MRSA, PCR 07/30/2020 NEGATIVE  NEGATIVE Final  . Staphylococcus aureus 07/30/2020 NEGATIVE  NEGATIVE Final   Comment: (NOTE) The Xpert SA Assay (FDA approved for NASAL specimens in patients 70 years of age and older), is one component of a comprehensive surveillance program. It is not intended to diagnose infection nor to guide or monitor treatment. Performed at [facility], [address], [place], [place] [address]   . ABO/RH(D) 07/30/2020 B NEG   Final  . Antibody Screen 07/30/2020 NEG   Final  . Sample Expiration 07/30/2020 08/13/2020,2359   Final  . Extend sample reason 07/30/2020    Final                   Value:NO TRANSFUSIONS OR PREGNANCY IN THE PAST 3 MONTHS Performed at [facility], [address] [place], [place] [address]   . Color, Urine 07/30/2020 YELLOW* YELLOW Final  . APPearance 07/30/2020 CLEAR* CLEAR Final  . Specific Gravity, Urine 07/30/2020 1.011   1.005 - 1.030 Final  . pH 07/30/2020 6.0  5.0 - 8.0 Final  . Glucose, UA 07/30/2020 NEGATIVE  NEGATIVE mg/dL Final  . Hgb urine dipstick 07/30/2020  NEGATIVE  NEGATIVE Final  . Bilirubin Urine 07/30/2020 NEGATIVE  NEGATIVE Final  . Ketones, ur 07/30/2020 NEGATIVE  NEGATIVE mg/dL Final  . Protein, ur 07/30/2020 NEGATIVE  NEGATIVE mg/dL Final  . Nitrite 07/30/2020 NEGATIVE  NEGATIVE Final  . Chalmers Guest 07/30/2020 NEGATIVE  NEGATIVE Final   Performed at [facility], [address] [place], [place] [address]    ECG: Date: 07/17/2020 Rate: 73 bpm Rhythm: normal sinus Intervals: PR 134 ms. QRS 80 ms. QTc 418 ms. ST segment and T wave changes: Evidence of nonspecific ST and T wave abnormalities  Comparison: Similar to previous tracing obtained on 03/02/2014 NOTE: Tracing obtained at [facility]; unable for review. Above based on cardiologist's interpretation.    IMAGING / PROCEDURES: ECHOCARDIOGRAM done on 08/05/2020 1. LVEF 55-60% 2. No regional wall motion abnormalities 3. Mild mitral valve regurgitation  LEXISCAN done on 08/04/2020 1. Normal left ventricular function 2. No evidence of stress-induced myocardial ischemia or arrhythmia 3. No evidence of scar 4. LVEF >55%  CT ANGIOGRAPHY OF HEAD AND NECK W OR WO CONTRAST done on 07/08/2020 1. Prominent mixed density atherosclerotic plaque at the right carotid bifurcation. Although the plaque only results in approximately 50% stenosis, a large plaque ulceration is seen. 2. Atherosclerotic disease of the left carotid bifurcation without hemodynamically significant stenosis. 3. A 2-3 mm outpouching projecting posteriorly from the supraclinoid right ICA, may represent an infundibulum versus aneurysm.  CAROTID DUPLEX BILATERAL done on 06/22/2020 1. Right Carotid: Velocities in the right ICA are consistent with a 80-99% stenosis. Non-hemodynamically significant plaque <50% noted in the CCA. The ECA appears >50% stenosed.  Right carotid velocities suggest 80-99% stenosis. The ECA appears >50% stenosed.  2. Left Carotid: Velocities in the left ICA are consistent with a 40-59% stenosis. Non-hemodynamically significant plaque <50% noted in the CCA. The ECA appears >50% stenosed. The left carotid velocities suggest 40-59% stenosis. The ECA appears >50% stenosed. 3. Vertebrals:  Bilateral vertebral arteries demonstrate antegrade flow.  4. Subclavians: Normal flow hemodynamics were seen in bilateral subclavian arteries.   MRI BRAIN WO CONTRAST done on 06/17/2020 1. No acute intracranial abnormality. 2. Small area of chronic cortical ischemia suspected in the anterior right frontal lobe, with a nearby chronic microhemorrhage and up to moderate signal changes in the pons which are nonspecific but most commonly due to small vessel disease.  ECHOCARDIOGRAM done on 05/31/2020 1. LVEF 55% 2. Normal left ventricular function normal right ventricular systolic function  3. Trivial MR and PR; mild TR; no AR 4. No valvular stenosis  Impression and Plan:  Francisco [name] has been referred for pre-anesthesia review and clearance prior to him undergoing the planned anesthetic and procedural courses. Available labs, pertinent testing, and imaging results were personally reviewed by me. This patient has been appropriately cleared by cardiology.    Based on clinical review performed today (08/05/20), barring any significant acute changes in the patient's overall condition, it is anticipated that he will be able to proceed with the planned surgical intervention. Any acute changes in clinical condition may necessitate his procedure being postponed and/or cancelled. Pre-surgical instructions were reviewed with the patient during his PAT appointment and questions were fielded by PAT clinical staff.  Honor Loh, MSN, APRN, FNP-C, CEN [facility]  Peri-operative Services Nurse Practitioner Phone: [phone] 08/05/20 3:09 PM  NOTE: This note has been prepared using Dragon dictation software. Despite my best ability to proofread, there is always the potential that unintentional transcriptional errors may still  occur from this process.

## 2020-08-03 ENCOUNTER — Other Ambulatory Visit: Payer: Self-pay | Admitting: Cardiology

## 2020-08-03 ENCOUNTER — Other Ambulatory Visit (HOSPITAL_COMMUNITY): Payer: Self-pay | Admitting: Cardiology

## 2020-08-03 DIAGNOSIS — R9431 Abnormal electrocardiogram [ECG] [EKG]: Secondary | ICD-10-CM

## 2020-08-03 DIAGNOSIS — R0602 Shortness of breath: Secondary | ICD-10-CM

## 2020-08-03 DIAGNOSIS — Z01810 Encounter for preprocedural cardiovascular examination: Secondary | ICD-10-CM

## 2020-08-03 NOTE — Telephone Encounter (Signed)
I attempted to contact the patient, due to the voicemail box being full I am unable to leave a message. Per Dr. Lucky Cowboy the patient is to be scheduled for a carotid stent placement 2nd or third week of December. I will get the patient scheduled for 08/20/20 with a 7:30 am arrival to the [facility]. Covid testing on 08/18/20 between 8-1 pm at the [place]. Pre-procedures will be mailed.

## 2020-08-04 ENCOUNTER — Ambulatory Visit
Admission: RE | Admit: 2020-08-04 | Discharge: 2020-08-04 | Disposition: A | Discharge status: Home / Self Care | Payer: Medicare Other | Source: Ambulatory Visit | Attending: Cardiology | Admitting: Cardiology

## 2020-08-04 ENCOUNTER — Other Ambulatory Visit: Payer: Self-pay

## 2020-08-04 DIAGNOSIS — R0602 Shortness of breath: Secondary | ICD-10-CM

## 2020-08-04 DIAGNOSIS — R9431 Abnormal electrocardiogram [ECG] [EKG]: Secondary | ICD-10-CM

## 2020-08-04 MED ORDER — TECHNETIUM TC 99M TETROFOSMIN IV KIT
10.0000 | PACK | Freq: Once | INTRAVENOUS | Status: AC | PRN
  Administered 2020-08-04: 10.857 via INTRAVENOUS

## 2020-08-04 MED ORDER — REGADENOSON 0.4 MG/5ML IV SOLN
0.4000 mg | Freq: Once | INTRAVENOUS | Status: AC
  Administered 2020-08-04: 0.4 mg via INTRAVENOUS

## 2020-08-04 MED ORDER — TECHNETIUM TC 99M TETROFOSMIN IV KIT
29.2100 | PACK | Freq: Once | INTRAVENOUS | Status: AC | PRN
  Administered 2020-08-04: 29.21 via INTRAVENOUS

## 2020-08-05 ENCOUNTER — Other Ambulatory Visit: Payer: Medicare Other

## 2020-08-05 ENCOUNTER — Ambulatory Visit
Admission: RE | Admit: 2020-08-05 | Discharge: 2020-08-05 | Disposition: A | Discharge status: Home / Self Care | Payer: Medicare Other | Source: Ambulatory Visit | Attending: Cardiology | Admitting: Cardiology

## 2020-08-05 ENCOUNTER — Other Ambulatory Visit
Admission: RE | Admit: 2020-08-05 | Discharge: 2020-08-05 | Disposition: A | Discharge status: Home / Self Care | Payer: Medicare Other | Source: Ambulatory Visit | Attending: Neurosurgery | Admitting: Neurosurgery

## 2020-08-05 DIAGNOSIS — Z01810 Encounter for preprocedural cardiovascular examination: Secondary | ICD-10-CM

## 2020-08-05 DIAGNOSIS — Z20822 Contact with and (suspected) exposure to covid-19: Secondary | ICD-10-CM | POA: Insufficient documentation

## 2020-08-05 DIAGNOSIS — R9431 Abnormal electrocardiogram [ECG] [EKG]: Secondary | ICD-10-CM

## 2020-08-05 DIAGNOSIS — J449 Chronic obstructive pulmonary disease, unspecified: Secondary | ICD-10-CM | POA: Insufficient documentation

## 2020-08-05 DIAGNOSIS — R0602 Shortness of breath: Secondary | ICD-10-CM

## 2020-08-05 DIAGNOSIS — I081 Rheumatic disorders of both mitral and tricuspid valves: Secondary | ICD-10-CM | POA: Insufficient documentation

## 2020-08-05 LAB — NM MYOCAR MULTI W/SPECT W/WALL MOTION / EF
Estimated workload: 1 METS
Exercise duration (sec): 51 s
LV dias vol: 81 mL (ref 62–150)
LV sys vol: 26 mL
MPHR: 165 {beats}/min
Peak HR: 99 {beats}/min
Percent HR: 68 %
Rest HR: 64 {beats}/min
SDS: 1
SRS: 4
SSS: 3
TID: 1.22

## 2020-08-05 LAB — ECHOCARDIOGRAM COMPLETE: S' Lateral: 2.35 cm

## 2020-08-05 LAB — SARS CORONAVIRUS 2 (TAT 6-24 HRS): SARS Coronavirus 2: NEGATIVE

## 2020-08-05 NOTE — Telephone Encounter (Signed)
Information was received from Dr. Lucky Cowboy that the patient will need to wait until January to have his carotid stent placement. Patient has been rescheduled to 09/17/20 with a 6:45 am arrival time to the [facility] and covid testing on 09/15/20 between 8-1 pm at the [place]. pre-procedure instructions will be mailed as the patient's voicemail box is full.

## 2020-08-05 NOTE — Progress Notes (Signed)
*  PRELIMINARY RESULTS* Echocardiogram 2D Echocardiogram has been performed.  Sherrie Sport 08/05/2020, 10:15 AM

## 2020-08-07 ENCOUNTER — Inpatient Hospital Stay: Payer: Medicare Other | Admitting: Urgent Care

## 2020-08-07 ENCOUNTER — Inpatient Hospital Stay
Admission: RE | Admit: 2020-08-07 | Discharge: 2020-08-12 | DRG: 472 | Disposition: A | Discharge status: Skilled Nursing Facility | Payer: Medicare Other | Attending: Neurosurgery | Admitting: Neurosurgery

## 2020-08-07 ENCOUNTER — Encounter
Admission: RE | Disposition: A | Discharge status: Skilled Nursing Facility | Payer: Self-pay | Source: Home / Self Care | Attending: Neurosurgery

## 2020-08-07 ENCOUNTER — Other Ambulatory Visit: Payer: Self-pay

## 2020-08-07 ENCOUNTER — Inpatient Hospital Stay: Payer: Medicare Other

## 2020-08-07 ENCOUNTER — Encounter: Payer: Self-pay | Admitting: Neurosurgery

## 2020-08-07 DIAGNOSIS — G959 Disease of spinal cord, unspecified: Secondary | ICD-10-CM | POA: Diagnosis present

## 2020-08-07 DIAGNOSIS — Z79890 Hormone replacement therapy (postmenopausal): Secondary | ICD-10-CM

## 2020-08-07 DIAGNOSIS — N319 Neuromuscular dysfunction of bladder, unspecified: Secondary | ICD-10-CM | POA: Diagnosis present

## 2020-08-07 DIAGNOSIS — M792 Neuralgia and neuritis, unspecified: Secondary | ICD-10-CM

## 2020-08-07 DIAGNOSIS — N1831 Chronic kidney disease, stage 3a: Secondary | ICD-10-CM | POA: Diagnosis not present

## 2020-08-07 DIAGNOSIS — Z85828 Personal history of other malignant neoplasm of skin: Secondary | ICD-10-CM | POA: Diagnosis not present

## 2020-08-07 DIAGNOSIS — G2581 Restless legs syndrome: Secondary | ICD-10-CM | POA: Diagnosis present

## 2020-08-07 DIAGNOSIS — Z7952 Long term (current) use of systemic steroids: Secondary | ICD-10-CM

## 2020-08-07 DIAGNOSIS — Z79899 Other long term (current) drug therapy: Secondary | ICD-10-CM

## 2020-08-07 DIAGNOSIS — E7800 Pure hypercholesterolemia, unspecified: Secondary | ICD-10-CM | POA: Diagnosis present

## 2020-08-07 DIAGNOSIS — Z85821 Personal history of Merkel cell carcinoma: Secondary | ICD-10-CM | POA: Diagnosis not present

## 2020-08-07 DIAGNOSIS — E039 Hypothyroidism, unspecified: Secondary | ICD-10-CM | POA: Diagnosis present

## 2020-08-07 DIAGNOSIS — G4733 Obstructive sleep apnea (adult) (pediatric): Secondary | ICD-10-CM | POA: Diagnosis present

## 2020-08-07 DIAGNOSIS — Z6834 Body mass index (BMI) 34.0-34.9, adult: Secondary | ICD-10-CM | POA: Diagnosis not present

## 2020-08-07 DIAGNOSIS — J449 Chronic obstructive pulmonary disease, unspecified: Secondary | ICD-10-CM | POA: Diagnosis present

## 2020-08-07 DIAGNOSIS — Z20822 Contact with and (suspected) exposure to covid-19: Secondary | ICD-10-CM | POA: Diagnosis present

## 2020-08-07 DIAGNOSIS — I739 Peripheral vascular disease, unspecified: Secondary | ICD-10-CM | POA: Diagnosis present

## 2020-08-07 DIAGNOSIS — G992 Myelopathy in diseases classified elsewhere: Secondary | ICD-10-CM | POA: Diagnosis present

## 2020-08-07 DIAGNOSIS — G8918 Other acute postprocedural pain: Secondary | ICD-10-CM | POA: Diagnosis not present

## 2020-08-07 DIAGNOSIS — Z419 Encounter for procedure for purposes other than remedying health state, unspecified: Secondary | ICD-10-CM

## 2020-08-07 DIAGNOSIS — Z9981 Dependence on supplemental oxygen: Secondary | ICD-10-CM | POA: Diagnosis not present

## 2020-08-07 DIAGNOSIS — D631 Anemia in chronic kidney disease: Secondary | ICD-10-CM | POA: Diagnosis present

## 2020-08-07 DIAGNOSIS — D638 Anemia in other chronic diseases classified elsewhere: Secondary | ICD-10-CM

## 2020-08-07 DIAGNOSIS — G7000 Myasthenia gravis without (acute) exacerbation: Secondary | ICD-10-CM | POA: Diagnosis present

## 2020-08-07 DIAGNOSIS — I129 Hypertensive chronic kidney disease with stage 1 through stage 4 chronic kidney disease, or unspecified chronic kidney disease: Secondary | ICD-10-CM | POA: Diagnosis present

## 2020-08-07 DIAGNOSIS — D72829 Elevated white blood cell count, unspecified: Secondary | ICD-10-CM

## 2020-08-07 DIAGNOSIS — N183 Chronic kidney disease, stage 3 unspecified: Secondary | ICD-10-CM | POA: Diagnosis present

## 2020-08-07 DIAGNOSIS — M4802 Spinal stenosis, cervical region: Principal | ICD-10-CM | POA: Diagnosis present

## 2020-08-07 DIAGNOSIS — Z7982 Long term (current) use of aspirin: Secondary | ICD-10-CM | POA: Diagnosis not present

## 2020-08-07 DIAGNOSIS — I10 Essential (primary) hypertension: Secondary | ICD-10-CM | POA: Diagnosis not present

## 2020-08-07 DIAGNOSIS — Z87891 Personal history of nicotine dependence: Secondary | ICD-10-CM | POA: Diagnosis not present

## 2020-08-07 DIAGNOSIS — Z981 Arthrodesis status: Secondary | ICD-10-CM

## 2020-08-07 DIAGNOSIS — G9589 Other specified diseases of spinal cord: Secondary | ICD-10-CM | POA: Diagnosis present

## 2020-08-07 DIAGNOSIS — E669 Obesity, unspecified: Secondary | ICD-10-CM | POA: Diagnosis present

## 2020-08-07 HISTORY — DX: Other cervical disc degeneration, unspecified cervical region: M50.30

## 2020-08-07 HISTORY — DX: Chronic kidney disease, stage 3 unspecified: N18.30

## 2020-08-07 HISTORY — DX: Other forms of angina pectoris: I20.89

## 2020-08-07 HISTORY — PX: POSTERIOR CERVICAL FUSION/FORAMINOTOMY: SHX5038

## 2020-08-07 HISTORY — DX: Occlusion and stenosis of unspecified carotid artery: I65.29

## 2020-08-07 HISTORY — DX: Other forms of angina pectoris: I20.8

## 2020-08-07 HISTORY — DX: Spinal stenosis, lumbosacral region: M48.07

## 2020-08-07 HISTORY — DX: Peripheral vascular disease, unspecified: I73.9

## 2020-08-07 SURGERY — POSTERIOR CERVICAL FUSION/FORAMINOTOMY LEVEL 3
Anesthesia: General

## 2020-08-07 MED ORDER — BACITRACIN 500 UNIT/GM EX OINT
TOPICAL_OINTMENT | CUTANEOUS | Status: DC | PRN
  Administered 2020-08-07: 1 via TOPICAL

## 2020-08-07 MED ORDER — THROMBIN 5000 UNITS EX SOLR
CUTANEOUS | Status: DC | PRN
  Administered 2020-08-07: 5000 [IU] via TOPICAL

## 2020-08-07 MED ORDER — HYDROMORPHONE HCL 1 MG/ML IJ SOLN
0.5000 mg | INTRAMUSCULAR | Status: DC | PRN
  Administered 2020-08-07 (×2): 0.5 mg via INTRAVENOUS

## 2020-08-07 MED ORDER — SUCCINYLCHOLINE CHLORIDE 20 MG/ML IJ SOLN
INTRAMUSCULAR | Status: DC | PRN
  Administered 2020-08-07: 120 mg via INTRAVENOUS

## 2020-08-07 MED ORDER — FENTANYL CITRATE (PF) 100 MCG/2ML IJ SOLN
INTRAMUSCULAR | Status: AC
  Filled 2020-08-07: qty 2

## 2020-08-07 MED ORDER — CEFAZOLIN SODIUM-DEXTROSE 2-4 GM/100ML-% IV SOLN
INTRAVENOUS | Status: AC
  Filled 2020-08-07: qty 100

## 2020-08-07 MED ORDER — PROPOFOL 10 MG/ML IV BOLUS
INTRAVENOUS | Status: DC | PRN
  Administered 2020-08-07: 70 mg via INTRAVENOUS
  Administered 2020-08-07: 120 mg via INTRAVENOUS
  Administered 2020-08-07: 20 mg via INTRAVENOUS

## 2020-08-07 MED ORDER — THROMBIN 5000 UNITS EX SOLR
CUTANEOUS | Status: AC
  Filled 2020-08-07: qty 5000

## 2020-08-07 MED ORDER — MENTHOL 3 MG MT LOZG
1.0000 | LOZENGE | OROMUCOSAL | Status: DC | PRN
  Filled 2020-08-07: qty 9

## 2020-08-07 MED ORDER — CELECOXIB 200 MG PO CAPS: 200.0000 mg | ORAL_CAPSULE | Freq: Two times a day (BID) | ORAL | Status: DC

## 2020-08-07 MED ORDER — BISACODYL 5 MG PO TBEC: 5.0000 mg | DELAYED_RELEASE_TABLET | Freq: Every day | ORAL | Status: DC | PRN

## 2020-08-07 MED ORDER — LEVOTHYROXINE SODIUM 88 MCG PO TABS
88.0000 ug | ORAL_TABLET | Freq: Every day | ORAL | Status: DC
  Administered 2020-08-08 – 2020-08-12 (×4): 88 ug via ORAL
  Filled 2020-08-07 (×5): qty 1

## 2020-08-07 MED ORDER — ORAL CARE MOUTH RINSE: 15.0000 mL | Freq: Once | OROMUCOSAL | Status: AC

## 2020-08-07 MED ORDER — BUPIVACAINE LIPOSOME 1.3 % IJ SUSP
INTRAMUSCULAR | Status: AC
  Filled 2020-08-07: qty 20

## 2020-08-07 MED ORDER — PANTOPRAZOLE SODIUM 40 MG PO TBEC
40.0000 mg | DELAYED_RELEASE_TABLET | Freq: Two times a day (BID) | ORAL | Status: DC
  Administered 2020-08-07 – 2020-08-12 (×10): 40 mg via ORAL
  Filled 2020-08-07 (×10): qty 1

## 2020-08-07 MED ORDER — BACITRACIN ZINC 500 UNIT/GM EX OINT
TOPICAL_OINTMENT | CUTANEOUS | Status: AC
  Filled 2020-08-07: qty 28.35

## 2020-08-07 MED ORDER — SUCCINYLCHOLINE CHLORIDE 200 MG/10ML IV SOSY
PREFILLED_SYRINGE | INTRAVENOUS | Status: AC
  Filled 2020-08-07: qty 10

## 2020-08-07 MED ORDER — FLEET ENEMA 7-19 GM/118ML RE ENEM: 1.0000 | ENEMA | Freq: Once | RECTAL | Status: DC | PRN

## 2020-08-07 MED ORDER — FENTANYL CITRATE (PF) 100 MCG/2ML IJ SOLN
25.0000 ug | INTRAMUSCULAR | Status: DC | PRN
  Administered 2020-08-07 (×4): 25 ug via INTRAVENOUS

## 2020-08-07 MED ORDER — PYRIDOSTIGMINE BROMIDE 60 MG PO TABS
60.0000 mg | ORAL_TABLET | Freq: Two times a day (BID) | ORAL | Status: DC
  Administered 2020-08-07 – 2020-08-11 (×9): 60 mg via ORAL
  Filled 2020-08-07 (×10): qty 1

## 2020-08-07 MED ORDER — LACTATED RINGERS IV SOLN: INTRAVENOUS | Status: DC | PRN

## 2020-08-07 MED ORDER — PHENOL 1.4 % MT LIQD
1.0000 | OROMUCOSAL | Status: DC | PRN
  Filled 2020-08-07: qty 177

## 2020-08-07 MED ORDER — ONDANSETRON HCL 4 MG PO TABS: 4.0000 mg | ORAL_TABLET | Freq: Four times a day (QID) | ORAL | Status: DC | PRN

## 2020-08-07 MED ORDER — ENOXAPARIN SODIUM 60 MG/0.6ML ~~LOC~~ SOLN
0.5000 mg/kg | SUBCUTANEOUS | Status: DC
  Administered 2020-08-08 – 2020-08-11 (×4): 55 mg via SUBCUTANEOUS
  Filled 2020-08-07 (×4): qty 0.6

## 2020-08-07 MED ORDER — CHLORHEXIDINE GLUCONATE 0.12 % MT SOLN
OROMUCOSAL | Status: AC
  Administered 2020-08-07: 15 mL via OROMUCOSAL
  Filled 2020-08-07: qty 15

## 2020-08-07 MED ORDER — ROCURONIUM BROMIDE 100 MG/10ML IV SOLN
INTRAVENOUS | Status: DC | PRN
  Administered 2020-08-07: 20 mg via INTRAVENOUS

## 2020-08-07 MED ORDER — BUPIVACAINE-EPINEPHRINE (PF) 0.5% -1:200000 IJ SOLN
INTRAMUSCULAR | Status: AC
  Filled 2020-08-07: qty 30

## 2020-08-07 MED ORDER — METHOCARBAMOL 750 MG PO TABS: 750.0000 mg | ORAL_TABLET | Freq: Four times a day (QID) | ORAL | Status: DC

## 2020-08-07 MED ORDER — SODIUM CHLORIDE 0.9% FLUSH
3.0000 mL | Freq: Two times a day (BID) | INTRAVENOUS | Status: DC
  Administered 2020-08-07 – 2020-08-11 (×8): 3 mL via INTRAVENOUS

## 2020-08-07 MED ORDER — EPHEDRINE SULFATE 50 MG/ML IJ SOLN
INTRAMUSCULAR | Status: DC | PRN
  Administered 2020-08-07 (×2): 10 mg via INTRAVENOUS

## 2020-08-07 MED ORDER — SODIUM CHLORIDE (PF) 0.9 % IJ SOLN
INTRAMUSCULAR | Status: AC
  Filled 2020-08-07: qty 20

## 2020-08-07 MED ORDER — ENOXAPARIN SODIUM 40 MG/0.4ML ~~LOC~~ SOLN: 40.0000 mg | SUBCUTANEOUS | Status: DC

## 2020-08-07 MED ORDER — SENNA 8.6 MG PO TABS
2.0000 | ORAL_TABLET | Freq: Two times a day (BID) | ORAL | Status: DC
  Administered 2020-08-07 – 2020-08-11 (×7): 17.2 mg via ORAL
  Filled 2020-08-07 (×8): qty 2

## 2020-08-07 MED ORDER — ACETAMINOPHEN 10 MG/ML IV SOLN
INTRAVENOUS | Status: AC
  Filled 2020-08-07: qty 100

## 2020-08-07 MED ORDER — LACTATED RINGERS IV SOLN
INTRAVENOUS | Status: DC
Start: 2020-08-07 — End: 2020-08-07

## 2020-08-07 MED ORDER — ONDANSETRON HCL 4 MG/2ML IJ SOLN: 4.0000 mg | Freq: Four times a day (QID) | INTRAMUSCULAR | Status: DC | PRN

## 2020-08-07 MED ORDER — BUPIVACAINE HCL (PF) 0.5 % IJ SOLN
INTRAMUSCULAR | Status: AC
  Filled 2020-08-07: qty 30

## 2020-08-07 MED ORDER — CEFAZOLIN SODIUM-DEXTROSE 2-4 GM/100ML-% IV SOLN
2.0000 g | Freq: Once | INTRAVENOUS | Status: AC
  Administered 2020-08-07: 2 g via INTRAVENOUS

## 2020-08-07 MED ORDER — ROPINIROLE HCL 1 MG PO TABS
4.0000 mg | ORAL_TABLET | Freq: Every day | ORAL | Status: DC
  Administered 2020-08-07 – 2020-08-11 (×5): 4 mg via ORAL
  Filled 2020-08-07 (×5): qty 4

## 2020-08-07 MED ORDER — REMIFENTANIL HCL 1 MG IV SOLR
INTRAVENOUS | Status: DC | PRN
Start: 2020-08-07 — End: 2020-08-07
  Administered 2020-08-07: .02 ug/kg/min via INTRAVENOUS

## 2020-08-07 MED ORDER — HYDROMORPHONE HCL 1 MG/ML IJ SOLN
0.5000 mg | INTRAMUSCULAR | Status: DC | PRN
  Administered 2020-08-09 – 2020-08-10 (×2): 0.5 mg via INTRAVENOUS
  Filled 2020-08-07 (×2): qty 1

## 2020-08-07 MED ORDER — SODIUM CHLORIDE 0.9% FLUSH
3.0000 mL | INTRAVENOUS | Status: DC | PRN
  Administered 2020-08-10: 3 mL via INTRAVENOUS

## 2020-08-07 MED ORDER — CELECOXIB 100 MG PO CAPS
100.0000 mg | ORAL_CAPSULE | Freq: Two times a day (BID) | ORAL | Status: DC
  Administered 2020-08-07 – 2020-08-11 (×9): 100 mg via ORAL
  Filled 2020-08-07 (×10): qty 1

## 2020-08-07 MED ORDER — MEPERIDINE HCL 50 MG/ML IJ SOLN
INTRAMUSCULAR | Status: AC
  Administered 2020-08-07: 12.5 mg
  Filled 2020-08-07: qty 1

## 2020-08-07 MED ORDER — MAGNESIUM 500 MG PO TABS: 500.0000 mg | ORAL_TABLET | Freq: Every day | ORAL | Status: DC

## 2020-08-07 MED ORDER — AZATHIOPRINE 50 MG PO TABS
150.0000 mg | ORAL_TABLET | Freq: Every day | ORAL | Status: DC
  Administered 2020-08-08 – 2020-08-11 (×4): 150 mg via ORAL
  Filled 2020-08-07 (×5): qty 3

## 2020-08-07 MED ORDER — ONDANSETRON HCL 4 MG/2ML IJ SOLN
INTRAMUSCULAR | Status: DC | PRN
  Administered 2020-08-07: 4 mg via INTRAVENOUS

## 2020-08-07 MED ORDER — CHLORHEXIDINE GLUCONATE 0.12 % MT SOLN: 15.0000 mL | Freq: Once | OROMUCOSAL | Status: AC

## 2020-08-07 MED ORDER — SODIUM CHLORIDE FLUSH 0.9 % IV SOLN
INTRAVENOUS | Status: AC
  Filled 2020-08-07: qty 20

## 2020-08-07 MED ORDER — LIDOCAINE HCL (CARDIAC) PF 100 MG/5ML IV SOSY
PREFILLED_SYRINGE | INTRAVENOUS | Status: DC | PRN
  Administered 2020-08-07: 100 mg via INTRAVENOUS

## 2020-08-07 MED ORDER — PROPOFOL 500 MG/50ML IV EMUL
INTRAVENOUS | Status: DC | PRN
  Administered 2020-08-07: 125 ug/kg/min via INTRAVENOUS

## 2020-08-07 MED ORDER — PROPOFOL 500 MG/50ML IV EMUL
INTRAVENOUS | Status: AC
  Filled 2020-08-07: qty 50

## 2020-08-07 MED ORDER — GLYCOPYRROLATE 0.2 MG/ML IJ SOLN
INTRAMUSCULAR | Status: DC | PRN
  Administered 2020-08-07: .2 mg via INTRAVENOUS

## 2020-08-07 MED ORDER — SODIUM CHLORIDE 0.9 % IV SOLN
INTRAVENOUS | Status: DC | PRN
  Administered 2020-08-07: 30 ug/min via INTRAVENOUS

## 2020-08-07 MED ORDER — BUPIVACAINE HCL (PF) 0.5 % IJ SOLN
INTRAMUSCULAR | Status: DC | PRN
  Administered 2020-08-07: 20 mL

## 2020-08-07 MED ORDER — OXYCODONE HCL 5 MG PO TABS
10.0000 mg | ORAL_TABLET | ORAL | Status: DC | PRN
  Administered 2020-08-07 – 2020-08-12 (×9): 10 mg via ORAL
  Filled 2020-08-07 (×8): qty 2

## 2020-08-07 MED ORDER — SODIUM CHLORIDE 0.9 % IV SOLN
INTRAVENOUS | Status: DC | PRN
  Administered 2020-08-07: 40 mL

## 2020-08-07 MED ORDER — ACETAMINOPHEN 500 MG PO TABS
1000.0000 mg | ORAL_TABLET | Freq: Four times a day (QID) | ORAL | Status: AC
  Administered 2020-08-07 – 2020-08-10 (×11): 1000 mg via ORAL
  Filled 2020-08-07 (×12): qty 2

## 2020-08-07 MED ORDER — OXYCODONE HCL 5 MG PO TABS
5.0000 mg | ORAL_TABLET | ORAL | Status: DC | PRN
  Administered 2020-08-10 – 2020-08-11 (×4): 5 mg via ORAL
  Filled 2020-08-07 (×6): qty 1

## 2020-08-07 MED ORDER — SODIUM CHLORIDE 0.9 % IV SOLN
50.0000 mL/h | INTRAVENOUS | Status: DC
  Administered 2020-08-07: 50 mL/h via INTRAVENOUS

## 2020-08-07 MED ORDER — PHENYLEPHRINE HCL (PRESSORS) 10 MG/ML IV SOLN
INTRAVENOUS | Status: DC | PRN
  Administered 2020-08-07 (×5): 100 ug via INTRAVENOUS

## 2020-08-07 MED ORDER — LOSARTAN POTASSIUM 50 MG PO TABS: 50.0000 mg | ORAL_TABLET | Freq: Two times a day (BID) | ORAL | Status: DC

## 2020-08-07 MED ORDER — METHOCARBAMOL 1000 MG/10ML IJ SOLN
500.0000 mg | Freq: Four times a day (QID) | INTRAVENOUS | Status: DC
  Administered 2020-08-07: 500 mg via INTRAVENOUS
  Filled 2020-08-07: qty 5

## 2020-08-07 MED ORDER — OXYBUTYNIN CHLORIDE 5 MG PO TABS
5.0000 mg | ORAL_TABLET | Freq: Three times a day (TID) | ORAL | Status: DC
  Administered 2020-08-07 – 2020-08-11 (×13): 5 mg via ORAL
  Filled 2020-08-07 (×14): qty 1

## 2020-08-07 MED ORDER — BUPIVACAINE-EPINEPHRINE (PF) 0.5% -1:200000 IJ SOLN
INTRAMUSCULAR | Status: DC | PRN
  Administered 2020-08-07: 10 mL

## 2020-08-07 MED ORDER — MEPERIDINE HCL 50 MG/ML IJ SOLN
12.5000 mg | INTRAMUSCULAR | Status: DC | PRN
  Administered 2020-08-07: 12.5 mg via INTRAVENOUS

## 2020-08-07 MED ORDER — DEXAMETHASONE SODIUM PHOSPHATE 10 MG/ML IJ SOLN
INTRAMUSCULAR | Status: DC | PRN
  Administered 2020-08-07: 10 mg via INTRAVENOUS

## 2020-08-07 MED ORDER — ONDANSETRON HCL 4 MG/2ML IJ SOLN: 4.0000 mg | Freq: Once | INTRAMUSCULAR | Status: DC | PRN

## 2020-08-07 MED ORDER — ROCURONIUM BROMIDE 10 MG/ML (PF) SYRINGE
PREFILLED_SYRINGE | INTRAVENOUS | Status: AC
  Filled 2020-08-07: qty 10

## 2020-08-07 MED ORDER — ACETAMINOPHEN 10 MG/ML IV SOLN
INTRAVENOUS | Status: DC | PRN
  Administered 2020-08-07: 1000 mg via INTRAVENOUS

## 2020-08-07 MED ORDER — MAGNESIUM OXIDE 400 (241.3 MG) MG PO TABS
400.0000 mg | ORAL_TABLET | Freq: Every day | ORAL | Status: DC
  Administered 2020-08-07 – 2020-08-11 (×5): 400 mg via ORAL
  Filled 2020-08-07 (×5): qty 1

## 2020-08-07 MED ORDER — LOSARTAN POTASSIUM 50 MG PO TABS
50.0000 mg | ORAL_TABLET | Freq: Two times a day (BID) | ORAL | Status: DC
  Administered 2020-08-08 – 2020-08-11 (×7): 50 mg via ORAL
  Filled 2020-08-07 (×8): qty 1

## 2020-08-07 MED ORDER — ALUM & MAG HYDROXIDE-SIMETH 200-200-20 MG/5ML PO SUSP: 30.0000 mL | Freq: Four times a day (QID) | ORAL | Status: DC | PRN

## 2020-08-07 MED ORDER — GABAPENTIN 300 MG PO CAPS
300.0000 mg | ORAL_CAPSULE | Freq: Two times a day (BID) | ORAL | Status: DC
  Administered 2020-08-07 – 2020-08-09 (×4): 300 mg via ORAL
  Filled 2020-08-07 (×4): qty 1

## 2020-08-07 MED ORDER — PREDNISONE 20 MG PO TABS
20.0000 mg | ORAL_TABLET | ORAL | Status: DC
  Administered 2020-08-08 – 2020-08-10 (×2): 20 mg via ORAL
  Filled 2020-08-07 (×2): qty 1

## 2020-08-07 MED ORDER — DOCUSATE SODIUM 100 MG PO CAPS
100.0000 mg | ORAL_CAPSULE | Freq: Two times a day (BID) | ORAL | Status: DC
  Administered 2020-08-07 – 2020-08-11 (×7): 100 mg via ORAL
  Filled 2020-08-07 (×8): qty 1

## 2020-08-07 MED ORDER — FENTANYL CITRATE (PF) 100 MCG/2ML IJ SOLN
INTRAMUSCULAR | Status: DC | PRN
  Administered 2020-08-07: 50 ug via INTRAVENOUS
  Administered 2020-08-07: 100 ug via INTRAVENOUS

## 2020-08-07 MED ORDER — HYDROMORPHONE HCL 1 MG/ML IJ SOLN
INTRAMUSCULAR | Status: AC
  Filled 2020-08-07: qty 1

## 2020-08-07 MED ORDER — REMIFENTANIL HCL 1 MG IV SOLR
INTRAVENOUS | Status: AC
  Filled 2020-08-07: qty 2000

## 2020-08-07 MED ORDER — DEXMEDETOMIDINE (PRECEDEX) IN NS 20 MCG/5ML (4 MCG/ML) IV SYRINGE
PREFILLED_SYRINGE | INTRAVENOUS | Status: DC | PRN
  Administered 2020-08-07: 8 ug via INTRAVENOUS

## 2020-08-07 MED ORDER — POLYETHYLENE GLYCOL 3350 17 G PO PACK: 17.0000 g | PACK | Freq: Every day | ORAL | Status: DC | PRN

## 2020-08-07 SURGICAL SUPPLY — 80 items
ADH SKN CLS APL DERMABOND .7 (GAUZE/BANDAGES/DRESSINGS) ×1
AGENT HMST MTR 8 SURGIFLO (HEMOSTASIS) ×1
APL PRP STRL LF DISP 70% ISPRP (MISCELLANEOUS) ×1
BIT DRILL SCRW 3.5 (BIT) ×2 IMPLANT
BLADE SURG 15 STRL LF DISP TIS (BLADE) ×1 IMPLANT
BLADE SURG 15 STRL SS (BLADE) ×2
BULB RESERV EVAC DRAIN JP 100C (MISCELLANEOUS) IMPLANT
BUR NEURO DRILL SOFT 3.0X3.8M (BURR) ×2 IMPLANT
CANISTER SUCT 1200ML W/VALVE (MISCELLANEOUS) ×4 IMPLANT
CAP LOCKING (Cap) ×8 IMPLANT
CHLORAPREP W/TINT 26 (MISCELLANEOUS) ×2 IMPLANT
COUNTER NEEDLE 20/40 LG (NEEDLE) ×2 IMPLANT
COVER BACK TABLE REUSABLE LG (DRAPES) ×2 IMPLANT
COVER LIGHT HANDLE STERIS (MISCELLANEOUS) ×4 IMPLANT
COVER WAND RF STERILE (DRAPES) ×2 IMPLANT
CUP MEDICINE 2OZ PLAST GRAD ST (MISCELLANEOUS) ×4 IMPLANT
DERMABOND ADVANCED (GAUZE/BANDAGES/DRESSINGS) ×1
DERMABOND ADVANCED .7 DNX12 (GAUZE/BANDAGES/DRESSINGS) ×1 IMPLANT
DRAIN CHANNEL JP 10F RND 20C F (MISCELLANEOUS) IMPLANT
DRAPE 3/4 80X56 (DRAPES) ×2 IMPLANT
DRAPE C ARM PK CFD 31 SPINE (DRAPES) ×4 IMPLANT
DRAPE INCISE IOBAN 66X45 STRL (DRAPES) IMPLANT
DRAPE LAPAROTOMY 100X77 ABD (DRAPES) ×2 IMPLANT
DRAPE MICROSCOPE SPINE 48X150 (DRAPES) IMPLANT
DRAPE POUCH INSTRU U-SHP 10X18 (DRAPES) ×2 IMPLANT
DRAPE SURG 17X11 SM STRL (DRAPES) ×8 IMPLANT
DRSG OPSITE POSTOP 4X6 (GAUZE/BANDAGES/DRESSINGS) ×2 IMPLANT
DRSG TEGADERM 2-3/8X2-3/4 SM (GAUZE/BANDAGES/DRESSINGS) ×2 IMPLANT
DRSG TELFA 4X3 1S NADH ST (GAUZE/BANDAGES/DRESSINGS) ×2 IMPLANT
ELECT CAUTERY BLADE TIP 2.5 (TIP) ×2
ELECTRODE CAUTERY BLDE TIP 2.5 (TIP) ×1 IMPLANT
FEE INTRAOP MONITOR IMPULS NCS (MISCELLANEOUS) ×1 IMPLANT
FRAME EYE SHIELD (PROTECTIVE WEAR) ×4 IMPLANT
GAUZE SPONGE 4X4 12PLY STRL (GAUZE/BANDAGES/DRESSINGS) ×2 IMPLANT
GLOVE BIOGEL PI IND STRL 7.0 (GLOVE) ×1 IMPLANT
GLOVE BIOGEL PI INDICATOR 7.0 (GLOVE) ×1
GLOVE SURG SYN 7.0 (GLOVE) ×4 IMPLANT
GLOVE SURG SYN 8.5  E (GLOVE) ×3
GLOVE SURG SYN 8.5 E (GLOVE) ×3 IMPLANT
GOWN SRG XL LVL 3 NONREINFORCE (GOWNS) ×1 IMPLANT
GOWN STRL NON-REIN TWL XL LVL3 (GOWNS) ×2
GOWN STRL REUS W/ TWL LRG LVL3 (GOWN DISPOSABLE) ×1 IMPLANT
GOWN STRL REUS W/ TWL XL LVL3 (GOWN DISPOSABLE) ×1 IMPLANT
GOWN STRL REUS W/TWL LRG LVL3 (GOWN DISPOSABLE) ×2
GOWN STRL REUS W/TWL XL LVL3 (GOWN DISPOSABLE) ×2
GRADUATE 1200CC STRL 31836 (MISCELLANEOUS) ×2 IMPLANT
HEMOVAC 400CC 10FR (MISCELLANEOUS) ×2 IMPLANT
IMPL QUARTEX 3.5X14MM (Neuro Prosthesis/Implant) ×8 IMPLANT
IMPLANT QUARTEX 3.5X14MM (Neuro Prosthesis/Implant) ×16 IMPLANT
INTRAOP MONITOR FEE IMPULS NCS (MISCELLANEOUS) ×1
INTRAOP MONITOR FEE IMPULSE (MISCELLANEOUS) ×1
KIT TURNOVER KIT A (KITS) ×2 IMPLANT
LOCKING CAP (Cap) ×16 IMPLANT
MANIFOLD NEPTUNE II (INSTRUMENTS) ×2 IMPLANT
MARKER SKIN DUAL TIP RULER LAB (MISCELLANEOUS) ×4 IMPLANT
NEEDLE HYPO 22GX1.5 SAFETY (NEEDLE) ×2 IMPLANT
NEEDLE SPNL 18GX3.5 QUINCKE PK (NEEDLE) ×6 IMPLANT
NS IRRIG 1000ML POUR BTL (IV SOLUTION) ×2 IMPLANT
PACK LAMINECTOMY NEURO (CUSTOM PROCEDURE TRAY) ×2 IMPLANT
PAD ARMBOARD 7.5X6 YLW CONV (MISCELLANEOUS) ×4 IMPLANT
PATTIES SURGICAL 1X1 (DISPOSABLE) ×2 IMPLANT
PIN CASPAR 14 (PIN) ×1 IMPLANT
PIN CASPAR 14MM (PIN) ×2
PIN MAYFIELD SKULL DISP (PIN) ×2 IMPLANT
PUTTY DBX 10CC (Bone Implant) ×2 IMPLANT
SCREW COMPRESSION MINI 3.5X18M (Screw) ×4 IMPLANT
SPOGE SURGIFLO 8M (HEMOSTASIS) ×1
SPONGE SURGIFLO 8M (HEMOSTASIS) ×1 IMPLANT
STAPLER SKIN PROX 35W (STAPLE) ×6 IMPLANT
SUT ETHILON 3-0 FS-10 30 BLK (SUTURE)
SUT V-LOC 90 ABS DVC 3-0 CL (SUTURE) ×2 IMPLANT
SUT VIC AB 0 CT1 27 (SUTURE) ×8
SUT VIC AB 0 CT1 27XCR 8 STRN (SUTURE) ×4 IMPLANT
SUT VIC AB 2-0 CT1 18 (SUTURE) ×6 IMPLANT
SUTURE EHLN 3-0 FS-10 30 BLK (SUTURE) IMPLANT
SYR 30ML LL (SYRINGE) ×6 IMPLANT
TAPE CLOTH 3X10 WHT NS LF (GAUZE/BANDAGES/DRESSINGS) ×4 IMPLANT
TOWEL OR 17X26 4PK STRL BLUE (TOWEL DISPOSABLE) ×8 IMPLANT
TRAY FOLEY MTR SLVR 16FR STAT (SET/KITS/TRAYS/PACK) ×2 IMPLANT
TUBING CONNECTING 10 (TUBING) ×2 IMPLANT

## 2020-08-07 NOTE — Progress Notes (Signed)
Pharmacy Antibiotic Note  Francisco [name] is a 74 y.o. male admitted on 08/07/2020 for disc decompression and fusion surgery.  Pharmacy has been consulted for cefazolin dosing for surgical prophylaxis.  Plan: Cefazolin 2g IV x 1     No data recorded.  No results for input(s): WBC, CREATININE, LATICACIDVEN, VANCOTROUGH, VANCOPEAK, VANCORANDOM, GENTTROUGH, GENTPEAK, GENTRANDOM, TOBRATROUGH, TOBRAPEAK, TOBRARND, AMIKACINPEAK, AMIKACINTROU, AMIKACIN in the last 168 hours.  Estimated Creatinine Clearance: 55.8 mL/min (A) (by C-G formula based on SCr of 1.39 mg/dL (H)).    Allergies  Allergen Reactions  . Azithromycin Other (See Comments)    Avoid due to myasthenia gravis  . Codeine Nausea And Vomiting    Brendolyn Patty, PharmD Clinical Pharmacist  08/07/2020   9:13 AM

## 2020-08-07 NOTE — Progress Notes (Signed)
error 

## 2020-08-07 NOTE — Progress Notes (Signed)
Awaiting surgery, c/o severe pain left leg 10/10, crying out in pain. Dr Andree Elk notified, demerol ordered and given with minimal relief. Bilateral hand grasps equal and moderate strength. Bilateral leg strength strong and equal.

## 2020-08-07 NOTE — Progress Notes (Signed)
PHARMACIST - PHYSICIAN COMMUNICATION  CONCERNING:  Enoxaparin (Lovenox) for DVT Prophylaxis    RECOMMENDATION: Patient was prescribed enoxaprin 40mg  q24 hours for VTE prophylaxis.   Filed Weights   08/07/20 0911  Weight: 108 kg (238 lb)    Body mass index is 34.15 kg/m.  Estimated Creatinine Clearance: 56.5 mL/min (A) (by C-G formula based on SCr of 1.39 mg/dL (H)).   Based on [place] patient is candidate for enoxaparin 0.5mg /kg TBW SQ every 24 hours based on BMI being >30.   DESCRIPTION: Pharmacy has adjusted enoxaparin dose per [facility] policy.  Patient is now receiving enoxaparin 55 mg every 24 hours    Berta Minor, PharmD Clinical Pharmacist  08/07/2020 4:05 PM

## 2020-08-07 NOTE — Anesthesia Preprocedure Evaluation (Signed)
Anesthesia Evaluation  Patient identified by MRN, date of birth, ID band Patient awake    Reviewed: Allergy & Precautions, H&P , NPO status , Patient's Chart, lab work & pertinent test results, reviewed documented beta blocker date and time   Airway Mallampati: III  TM Distance: >3 FB Neck ROM: full    Dental  (+) Teeth Intact, Poor Dentition   Pulmonary shortness of breath and with exertion, sleep apnea , COPD,  COPD inhaler and oxygen dependent, former smoker,    Pulmonary exam normal        Cardiovascular Exercise Tolerance: Poor hypertension, On Medications + angina with exertion + Peripheral Vascular Disease  Normal cardiovascular exam Rhythm:regular Rate:Normal     Neuro/Psych Myasthenia  Neuromuscular disease negative psych ROS   GI/Hepatic negative GI ROS, Neg liver ROS,   Endo/Other  Hypothyroidism   Renal/GU Renal disease  negative genitourinary   Musculoskeletal   Abdominal   Peds  Hematology  (+) Blood dyscrasia, anemia ,   Anesthesia Other Findings Past Medical History: No date: Arthritis     Comment:  lower left hip No date: Atypical angina (HCC) No date: Bilateral hand numbness     Comment:  from back surgery No date: Bronchitis, chronic (HCC) No date: Cancer [facility])     Comment:  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; Merkel cell cancer, and Basal               cell cancer (twice; 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 No date: Carotid stenosis No date: CKD (chronic kidney disease) stage 3, GFR 30-59 ml/min (HCC) No date: COPD (chronic obstructive pulmonary disease) (HCC)     Comment:  stage 2 No date: DDD (degenerative disc disease), cervical No date: Hypercholesterolemia No date: Hypertension No date: Hypothyroidism     Comment:  pt takes Levothyroxine daily No date: Lumbosacral spinal stenosis No date: Myasthenia gravis, adult form ([place]) No date: PAD (peripheral artery disease) ([place]) No date: Shortness of breath      Comment:  Lung MD- Dr Darlin Coco No date: Sleep apnea     Comment:  do not use CPAP every night Past Surgical History: 07/18/2011: ANTERIOR CERVICAL DECOMP/DISCECTOMY FUSION     Comment:  Procedure: ANTERIOR CERVICAL DECOMPRESSION/DISCECTOMY               FUSION 2 LEVELS;  Surgeon: Charlie Pitter;  Location: [place]               NEURO ORS;  Service: Neurosurgery;  Laterality: N/A;                cervical five-six, cervical six-seven anterior cervical               discectomy and fusion No date: BACK SURGERY     Comment:  in [facility] No date: BILATERAL CARPAL TUNNEL RELEASE     Comment:  01/2020 Right, 04/2020 Left No date: CARDIAC CATHETERIZATION     Comment:  2005 at [facility], no stents 01/06/2020: CATARACT EXTRACTION W/PHACO; Left     Comment:  Procedure: CATARACT EXTRACTION PHACO AND INTRAOCULAR               LENS PLACEMENT (IOC) ISTENT INJ LEFT 3.81  00:33.3;                Surgeon: Eulogio Bear, MD;  Location: [place];  Service: Ophthalmology;  Laterality: Left; 02/03/2020: CATARACT EXTRACTION W/PHACO;  Right     Comment:  Procedure: CATARACT EXTRACTION PHACO AND INTRAOCULAR               LENS PLACEMENT ([place]) RIGHT ISTENT INJ;  Surgeon: Eulogio Bear, MD;  Location: [place];                Service: Ophthalmology;  Laterality: Right;  4.29 0:35.6 No date: COLONOSCOPY No date: HERNIA REPAIR; Left     Comment:  inguinal hernia repair in 1985 02/24/2014: LUMBAR LAMINECTOMY/DECOMPRESSION MICRODISCECTOMY; Left     Comment:  Procedure: LUMBAR LAMINECTOMY/DECOMPRESSION               MICRODISCECTOMY LUMBAR THREE-FOUR, FOUR-FIVE, LEFT               FIVE-SACRAL ONE ;  Surgeon: Charlie Pitter, MD;  Location:               [place] NEURO ORS;  Service: Neurosurgery;  Laterality: Left;               LUMBAR LAMINECTOMY/DECOMPRESSION MICRODISCECTOMY LUMBAR               THREE-FOUR, FOUR-FIVE, LEFT FIVE-SACRAL ONE  8/14: PROSTATECTOMY      Comment:  [facility] Dr Mare Ferrari  BMI    Body Mass Index: 34.15 kg/m     Reproductive/Obstetrics negative OB ROS                             Anesthesia Physical Anesthesia Plan  ASA: III  Anesthesia Plan: General ETT   Post-op Pain Management:    Induction:   PONV Risk Score and Plan:   Airway Management Planned:   Additional Equipment:   Intra-op Plan:   Post-operative Plan:   Informed Consent: I have reviewed the patients History and Physical, chart, labs and discussed the procedure including the risks, benefits and alternatives for the proposed anesthesia with the patient or authorized representative who has indicated his/her understanding and acceptance.     Dental Advisory Given  Plan Discussed with: CRNA  Anesthesia Plan Comments:         Anesthesia Quick Evaluation

## 2020-08-07 NOTE — Anesthesia Procedure Notes (Signed)
Arterial Line Insertion Start/End11/26/2021 11:18 AM, 08/07/2020 11:20 AM Performed by: Molli Barrows, MD, Hedda Slade, CRNA, CRNA  Patient location: Pre-op. Preanesthetic checklist: patient identified, IV checked, site marked, risks and benefits discussed, surgical consent, monitors and equipment checked, pre-op evaluation, timeout performed and anesthesia consent Lidocaine 1% used for infiltration Left, radial was placed Catheter size: 20 Fr Hand hygiene performed  and maximum sterile barriers used   Attempts: 2 (first attempt venous blood return) Procedure performed without using ultrasound guided technique. Following insertion, dressing applied. Post procedure assessment: normal and unchanged  Patient tolerated the procedure well with no immediate complications.

## 2020-08-07 NOTE — Anesthesia Procedure Notes (Signed)
Procedure Name: Intubation Date/Time: 08/07/2020 11:04 AM Performed by: Hedda Slade, CRNA Pre-anesthesia Checklist: Patient identified, Patient being monitored, Timeout performed, Emergency Drugs available and Suction available Patient Re-evaluated:Patient Re-evaluated prior to induction Oxygen Delivery Method: Circle system utilized Preoxygenation: Pre-oxygenation with 100% oxygen Induction Type: IV induction Ventilation: Oral airway inserted - appropriate to patient size and Two handed mask ventilation required Laryngoscope Size: McGraph and 4 Grade View: Grade I Tube type: Oral Tube size: 7.5 mm Number of attempts: 1 Airway Equipment and Method: Stylet and Video-laryngoscopy Placement Confirmation: ETT inserted through vocal cords under direct vision,  positive ETCO2 and breath sounds checked- equal and bilateral Secured at: 22 cm Tube secured with: Tape Dental Injury: Teeth and Oropharynx as per pre-operative assessment

## 2020-08-07 NOTE — Transfer of Care (Signed)
Immediate Anesthesia Transfer of Care Note  Patient: Francisco [name]  Procedure(s) Performed: C3-6 POSTERIOR FUSION WITH DECOMPRESSION (N/A )  Patient Location: PACU  Anesthesia Type:General  Level of Consciousness: awake, alert  and oriented  Airway & Oxygen Therapy: Patient Spontanous Breathing and Patient connected to face mask oxygen  Post-op Assessment: Report given to RN and Post -op Vital signs reviewed and stable  Post vital signs: Reviewed and stable  Last Vitals:  Vitals Value Taken Time  BP 144/68 08/07/20 1426  Temp    Pulse 88 08/07/20 1426  Resp 11 08/07/20 1426  SpO2 99 % 08/07/20 1426    Last Pain:  Vitals:   08/07/20 1426  PainSc: 0-No pain      Patients Stated Pain Goal: 0 (43/60/67 7034)  Complications: No complications documented.

## 2020-08-07 NOTE — H&P (Signed)
I have reviewed and confirmed my history and physical from 07/17/2020 with no additions or changes. Plan for C3-6 posterior decompression and fusion.  Risks and benefits reviewed.  Heart sounds normal no MRG. Chest Clear to Auscultation Bilaterally.

## 2020-08-07 NOTE — Op Note (Signed)
Indications: Mr. Sarr is a 74 yo male who presented with cervical myelopathy with rapidly progressive symptoms.  Due to worsening functional status, surgery was recommended.  Findings: severe stenosis  Preoperative Diagnosis: Cervical myelopathy Postoperative Diagnosis: same   EBL: 200 ml IVF: 1000 ml Drains: 1 placed Disposition: Extubated and Stable to PACU Complications: none  A foley catheter was placed.   Preoperative Note:   Risks of surgery discussed include: infection, bleeding, stroke, coma, death, paralysis, CSF leak, nerve/spinal cord injury, numbness, tingling, weakness, complex regional pain syndrome, recurrent stenosis and/or disc herniation, vascular injury, development of instability, neck/back pain, need for further surgery, persistent symptoms, development of deformity, and the risks of anesthesia. The patient understood these risks and agreed to proceed.  Operative Note:   OPERATIVE PROCEDURE:  1. Posterior Segmental Instrumentation C3-6 using Globus Quartex 2. Posterolateral arthrodesis from C3-6 3. Cervical Laminectomy from C3-C5 for decompression of the spinal cord 4. Harvesting of autograft via the same incision 5. Use of flouroscopy 6. Placement of May[place]  OPERATIVE PROCEDURE:  After induction of general anesthesia, the May field was placed. The patient was then positioned no the table.   A midline incision was then planned using fluoroscopy.  A timeout was performed, and antibiotics given.  Next, the posterior cervical region was prepped and draped in the usual sterile fashion. The incision was injected with local anesthetic, the opened sharply. A subperiosteal dissection was then carried out to expose the remaining posterior elements from C2 and C6, with careful attention paid to maintaining the C2/3 facet capsule.  After satisfactory exposure had been obtained, our attention was turned to placement of lateral mass screws.  On each side, the high speed  drill was used to remove the soft tissue of the facet from C3/4 to C5/6. Lateral mass screws were then placed at each level using a modified Magerl technique. Briefly, a pilot hole was drilled in the lateral mass using the high-speed drill on each side.  Next, a drill was used to drill a tract in each lateral mass to 9mm. A balltip probe was used to confirm lack of breach. We then placed 3.5x 14 mm screws at C3, 3.5x 14 mm screws at C4, 3.5x 14 mm screws at C5, and 3.5x 14 mm screws at C6.  Rods were measured and shaped, then secured to the screws according to manufacturer's specifications.  After placement of the lateral mass screws, the high speed drill was used to drill trough laminectomies from C3 to C5. The spinous processes were disconnected from the intraspinous ligaments at C2/3 and C5/6, then the ligamentum flavum was carefully divided at C2/3 and C5/6 using a Kerrison punch and upgoing curettes. The laminae and spinous processes were then removed en bloc. The decompression was completed using a Kerrison punch until the spinal cord was adequately decompressed.  The leading edge of C6 was removed with a kerrison rongeur. We palpated to ensure adequate decompression.  A rod was then measured, shaped, and secured to the screws using locking caps tightened to manufacturer's specifications.   Monitoring showed stable findings after decompression, though hand MEPs were in and out throughout the case.  After decompression was complete, final AP and lateral radiographs were taken.  The wound was copiously irrigated with bacitracin-containing solution and hemostasis was achieved.  Using high-speed drill, the lateral margin of the lateral masses was gently decorticated.  The bone harvested from the laminectomy was processed and placed posterolaterally and in the facet joints to aid in arthrodesis.  A Hemovac drain was then placed in the wound deep to the fascia.   The wound was closed in a multilayer  fashion using interrupted 0 and 2-0 Vicryl sutures.  The final skin edges were reapproximated using a 3-0 Vicryl.  Staples were used on the skin. After closure, the patient was flipped supine and the Mayfield removed.  Patient was then handed back over to anesthesia. All counts were correct at the conclusion of the procedure.  Neurological monitoring was used throughout, and there were no changes.  Lonell Face NP acted as an Pensions consultant throughout the case.   Meade Maw MD

## 2020-08-08 NOTE — Evaluation (Signed)
Occupational Therapy Evaluation Patient Details Name: Francisco [name] MRN: [id number] DOB: 1945-09-22 Today's Date: 08/08/2020    History of Present Illness Pt is a 74 y/o M with hx of cervical myelopathy & admitted on 08/07/20 for scheduled C3-6 Posterior fusion with decompression by Dr. Izora Ribas. PMH: atypical angina, PAD, carotid stenosis, HTN, HLD, COPD, Myasthenia gravis, hypothyroidism, CKD-III, OSA requiring nocturnal PAP therapy, OA, cervical DDD, lumbosacral spinal stenosis, chronic DOE 2/2 COPD   Clinical Impression   Pt seen for OT evaluation this date, POD#1 from above cervical surgery. Prior to hospital admission, pt was recently requiring increased assist for LB ADL, tub transfers, and overnight toileting needs from his spouse. Most recently requiring a w/c for primary mobility in the home. Pt with multiple falls over the past 12 months per pt and spouse report. Both are eager for pt to return to prior independence and return to full time work Administrator, arts, lots of driving). Pt has been recently unable to drive. Pt lives with spouse in a single family home with 3 steps to enter and L handrail with spouse able to provide 24/7 assist/support as needed for pt.   Currently pt requires Min-Mod A for bed mobility, Min-Mod A for ADL transfers from elevated surfaces (to mimic home set up), and Mod A for LB ADL tasks. Pt tolerated standing for >86min with RW for stability but able to maintain fair balance with cues for improved posture. Pt/spouse educated in cervical precautions, Aspen cervical collar mgt, compression stocking mgt, self care skills, AE/DME, and home/routines modifications to maximize safety and functional independence while minimizing falls risk and maintaining precautions. Pt/spouse verbalized understanding of all education/training provided. Handout provided to support recall and carry over of learned precautions/techniques for bed mobility, functional transfers, and self care  skills. Given significant increase in level of assist required and prior independence at true baseline, recommending high intensity skilled OT services to maximize return to PLOF, minimize risk of future falls, and minimize caregiver burden. Recommend CIR at this time.     Follow Up Recommendations  CIR    Equipment Recommendations  3 in 1 bedside commode    Recommendations for Other Services Rehab consult     Precautions / Restrictions Precautions Precautions: Cervical;Fall Precaution Booklet Issued: Yes (comment) Precaution Comments: no bending, lifting >10lbs, twisting, arching Required Braces or Orthoses: Cervical Brace Cervical Brace: At all times;Hard collar (per orders: pt can remove brace when in bed, don brace supine) Restrictions Weight Bearing Restrictions: No      Mobility Bed Mobility Overal bed mobility: Needs Assistance Bed Mobility: Rolling;Sit to Sidelying;Supine to Sit Rolling: Min assist   Supine to sit: Mod assist;Min assist Sit to supine: Mod assist;HOB elevated Sit to sidelying: Mod assist General bed mobility comments: BLE mgt assist when log rolling    Transfers Overall transfer level: Needs assistance Equipment used: Rolling walker (2 wheeled) Transfers: Sit to/from Stand Sit to Stand: Min assist;From elevated surface;Mod assist        General transfer comment: elevated bed to mimic home set up, cues to scoot towards  EOB first, cues for hand and foot placement, initial Mod A for transfer, improving to Min A with additional trials from [facility] and EOB    Balance Overall balance assessment: Needs assistance Sitting-balance support: No upper extremity supported;Feet supported Sitting balance-Leahy Scale: Fair Sitting balance - Comments: BUE support for supervision static sitting balance, posterior lean when raising 1 LE off of floor in sitting with CGA/min assist from PT  Standing balance support: Single extremity supported;Bilateral upper  extremity supported;During functional activity Standing balance-Leahy Scale: Fair Standing balance comment: when ambulating, moderate BUE support through RW, in static standing, pt able to stand with CGA and only light LUE support on RW, cues for posture, slight posterior lean initially but can correct with cues for posture                           ADL either performed or assessed with clinical judgement   ADL Overall ADL's : Needs assistance/impaired Eating/Feeding: With caregiver independent assisting Eating/Feeding Details (indicate cue type and reason): spouse feeding pt upon OT's arrival Grooming: Sitting;Minimal assistance   Upper Body Bathing: Sitting;Minimal assistance;Moderate assistance   Lower Body Bathing: Sitting/lateral leans;Moderate assistance   Upper Body Dressing : Sitting;Minimal assistance;Moderate assistance   Lower Body Dressing: Sitting/lateral leans;Moderate assistance   Toilet Transfer: RW;Ambulation;BSC;Cueing for sequencing;Cueing for safety;Minimal assistance   Toileting- Clothing Manipulation and Hygiene: Maximal assistance;Sit to/from stand Toileting - Clothing Manipulation Details (indicate cue type and reason): pt typically will use grab bar at home in front of him to pull up; CGA-Min A to stand with RW and spouse performed pericare     Functional mobility during ADLs: Minimal assistance;Cueing for sequencing;Cueing for safety;Rolling walker       Vision Baseline Vision/History: Wears glasses Wears Glasses: Reading only Patient Visual Report: No change from baseline       Perception     Praxis      Pertinent Vitals/Pain Pain Assessment: 0-10 Pain Score: 3  Pain Location: neck & R shoulder at end of session supine, up to 6-7/10 with mobility Pain Descriptors / Indicators: Aching Pain Intervention(s): Limited activity within patient's tolerance;Monitored during session;Premedicated before session;Repositioned     Hand  Dominance Right   Extremity/Trunk Assessment Upper Extremity Assessment Upper Extremity Assessment: RUE deficits/detail;LUE deficits/detail RUE Deficits / Details: limited shoulder strength assessment 2/2 pain in R neck/shoulder, good elbow ext/flex, good grip, impaired FMC, hand eye, RAM, and sensation in hand/fingers causing pt to have difficulty with sustained motor control for [facility] tasks including self feeding, grooming RUE: Unable to fully assess due to pain RUE Sensation: decreased light touch;decreased proprioception RUE Coordination: decreased fine motor LUE Deficits / Details: fair shoulder, good elbow ext/flex, good grip, impaired FMC, hand eye, RAM, and sensation in hand/fingers as well as UE causing pt to have difficulty with sustained motor control for [facility] tasks including self feeding, grooming LUE Sensation: decreased light touch;decreased proprioception LUE Coordination: decreased fine motor   Lower Extremity Assessment Lower Extremity Assessment:  (grossly 3+/5 as pt able to stand with RW without buckling noted, limited AROM L ankle dorsiflexion but PROM WNL, impaired heel to shin BLE (L slightly worse than R), mild decreased sensation LLE, BLE proprioception impaired; B foot +1 pitting edema)   Cervical / Trunk Assessment Cervical / Trunk Assessment:  (rounded shoulders, s/p cervical surgery; past lumbar surgeries)   Communication Communication Communication: No difficulties   Cognition Arousal/Alertness: Awake/alert Behavior During Therapy: WFL for tasks assessed/performed Overall Cognitive Status: Within Functional Limits for tasks assessed                                 General Comments: Very pleasant gentleman, motivated to participate.   General Comments  VSS throughout, drain in place    Exercises Other Exercises Other Exercises: Pt/spouse instructed in cervical precautions and how  to maintain during bed mobility, ADL transfers, Aspen cervical  collar mgt, compression stocking mgt, AE/DME for ADL, home/routines modifications, falls prevention; handout provided   Shoulder Instructions      Home Living Family/patient expects to be discharged to:: Private residence Living Arrangements: Spouse/significant other Available Help at Discharge: Family;Available PRN/intermittently Type of Home: House Home Access: Stairs to enter [employer] of Steps: 3 Entrance Stairs-Rails: Left Home Layout: One level         Bathroom Toilet: Handicapped height     Home Equipment: Environmental consultant - 2 wheels;Cane - single point;Wheelchair - manual;Grab bars - tub/shower;Grab bars - toilet;Hand held shower head;Adaptive equipment Adaptive Equipment: Sock aid;Long-handled shoe horn;Long-handled sponge Additional Comments: had a reacher but it's broken      Prior Functioning/Environment Level of Independence: Needs assistance  Gait / Transfers Assistance Needed: 6 weeks ago pt was independent (going snow tubing) then began to need RW for mobility, 3 weeks ago pt required w/c for mobility ADL's / Homemaking Assistance Needed: spouse has recently been assisting with tub transfers and LB dressing; couple primarily gets take out/don't cook much, pt indep with med mgt, spouse has been driving since pt starting having more difficulty; pt wearing incontinence briefs recently 2/2 issues and spouse assisting particularly with overnight toileting to change briefs, have fabric chucks at home   Comments: 6 weeks ago pt was independent (going snow tubing) then began to need RW for mobility, 3 weeks ago pt required w/c for mobility        OT Problem List: Decreased strength;Decreased range of motion;Impaired sensation;Increased edema;Pain;Decreased coordination;Decreased activity tolerance;Decreased knowledge of use of DME or AE;Impaired balance (sitting and/or standing);Impaired UE functional use;Decreased knowledge of precautions      OT  Treatment/Interventions: Self-care/ADL training;Therapeutic exercise;Therapeutic activities;DME and/or AE instruction;Patient/family education;Balance training    OT Goals(Current goals can be found in the care plan section) Acute Rehab OT Goals Patient Stated Goal: to get better OT Goal Formulation: With patient/family Time For Goal Achievement: 08/22/20 Potential to Achieve Goals: Good ADL Goals Pt Will Perform Lower Body Dressing: with set-up;sit to/from stand;with adaptive equipment;with min assist (maintaining cervical precautions) Pt Will Transfer to Toilet: with min guard assist;ambulating (elevated commode, LRAD for amb, maintaining cervical precautions) Additional ADL Goal #1: Pt will perform pericare/toileting hygiene with CGA and no LOB while maintaining cervical precautions. Additional ADL Goal #2: Pt will independently instruct spouse in Aspen cervical collar mgt and verbalize 100% of cervical precautions.  OT Frequency: Min 3X/week   Barriers to D/C:            Co-evaluation              AM-PAC OT "6 Clicks" Daily Activity     Outcome Measure Help from another person eating meals?: A Little Help from another person taking care of personal grooming?: A Little Help from another person toileting, which includes using toliet, bedpan, or urinal?: A Little Help from another person bathing (including washing, rinsing, drying)?: A Lot Help from another person to put on and taking off regular upper body clothing?: A Lot Help from another person to put on and taking off regular lower body clothing?: A Lot 6 Click Score: 15   End of Session Equipment Utilized During Treatment: Gait belt;Rolling walker;Cervical collar Nurse Communication: Mobility status  Activity Tolerance: Patient tolerated treatment well Patient left: in bed;with call bell/phone within reach;with bed alarm set;with nursing/sitter in room;with family/visitor present;with SCD's reapplied;Other (comment)  (back drain in place)  OT Visit  Diagnosis: Other abnormalities of gait and mobility (R26.89);Repeated falls (R29.6);Muscle weakness (generalized) (M62.81);Pain Pain - Right/Left: Right Pain - part of body: Shoulder                Time: 1594-5859 OT Time Calculation (min): 69 min Charges:  OT General Charges $OT Visit: 1 Visit OT Evaluation $OT Eval High Complexity: 1 High OT Treatments $Self Care/Home Management : 53-67 mins  Jeni Salles, MPH, MS, OTR/L ascom [phone] 08/08/20, 12:34 PM

## 2020-08-08 NOTE — Progress Notes (Signed)
    Attending Progress Note  History: Francisco [name] is here for cervical myelopathy.  POD1: Mr. Skaggs has noted improvement in his hand symptoms.  He has mild neck pain.    Physical Exam: Vitals:   08/07/20 1942 08/07/20 2343  BP: (!) 143/59 (!) 123/58  Pulse: 67 70  Resp: 16 16  Temp: 98 F (36.7 C) 98.7 F (37.1 C)  SpO2: 100% 99%    AA Ox3 CNI  Strength:5/5 throughout BUE except B deltoid and grip, which are 4/5  Incision c/d/i  Data:  No results for input(s): NA, K, CL, CO2, BUN, CREATININE, LABGLOM, GLUCOSE, CALCIUM in the last 168 hours. No results for input(s): AST, ALT, ALKPHOS in the last 168 hours.  Invalid input(s): TBILI   No results for input(s): WBC, HGB, HCT, PLT in the last 168 hours. No results for input(s): APTT, INR in the last 168 hours.       Other tests/results: none to review  Assessment/Plan:  FERRY MATTHIS is doing well after C3-6 PSFD.    - mobilize - pain control - DVT prophylaxis - PTOT - monitor drain output.    Meade Maw MD, [facility] Department of Neurosurgery

## 2020-08-08 NOTE — Progress Notes (Signed)
Physical Therapy Treatment Patient Details Name: Francisco [name] MRN: [id number] DOB: August 23, 1946 Today's Date: 08/08/2020    History of Present Illness Pt is a 74 y/o M with hx of cervical myelopathy & admitted on 08/07/20 for scheduled C3-6 Posterior fusion with decompression by Dr. Izora Ribas. PMH: atypical angina, PAD, carotid stenosis, HTN, HLD, COPD, Myasthenia gravis, hypothyroidism, CKD-III, OSA requiring nocturnal PAP therapy, OA, cervical DDD, lumbosacral spinal stenosis, chronic DOE 2/2 COPD    PT Comments    Pt is pleasant & agreeable to tx, wife present for session. PT provides ongoing education for log rolling for bed mobility but pt requires use of hospital bed features (HOB elevated, bed rails) & min/mod assist. Pt is able to complete sit<>stand transfers with cuing for safe hand placement with less assistance compared to AM session. Pt completes side steps EOB & 2 steps forwards/backwards x 2 times then 1 time with RW & min assist but mod assist 2/2 1 LOB & assistance to correct. Hesitant to progress gait distances on this date without 2nd person for chair follow as pt notes increased BUE/BLE fatigue with gait noted by EOB & pt also demonstrates significantly impaired BLE coordination & stepping pattern. Pt continues to remain a CIR candidate & will continue to follow pt acutely to focus on transfers & gait with LRAD, bed mobility without hospital bed features, sitting/standing balance, BUE/BLE coordination & neuromuscular control.  Pt with aspen collar donned during session; pt reviews need to wear c-collar unless in bed but pt elects to keep it on even when supine.  Pt incontinent of urine during session & unaware. PT provides assistance for changing gown. Pt also performs BLE LAQ & hip adduction squeezes sitting EOB for BLE strengthening.   Follow Up Recommendations  CIR     Equipment Recommendations   (TBD in next venue)    Recommendations for Other Services Rehab  consult     Precautions / Restrictions Precautions Precautions: Cervical;Fall Precaution Booklet Issued: Yes (comment) Precaution Comments: no bending, lifting >10lbs, twisting, arching Required Braces or Orthoses: Cervical Brace Cervical Brace: At all times;Hard collar (per orders: pt can remove brace when in bed, don brace supine) Restrictions Weight Bearing Restrictions: No    Mobility  Bed Mobility Overal bed mobility: Needs Assistance Bed Mobility: Rolling;Sidelying to Sit;Sit to Supine Rolling: Min assist (bed rails) Sidelying to sit: Min assist;HOB elevated (bed rails) Supine to sit: Mod assist;Min assist Sit to supine: Mod assist;HOB elevated Sit to sidelying: Mod assist General bed mobility comments: multimodal cuing for log rolling technique, mod assist to elevate BLE onto bed  Transfers Overall transfer level: Needs assistance Equipment used: Rolling walker (2 wheeled) Transfers: Sit to/from Stand Sit to Stand: Min assist         General transfer comment: cuing for safe hand placement  Ambulation/Gait Ambulation/Gait assistance: Min assist;Mod assist Gait Distance (Feet): 4 Feet Assistive device: Rolling walker (2 wheeled)           Stairs             Wheelchair Mobility    Modified Rankin (Stroke Patients Only)       Balance Overall balance assessment: Needs assistance Sitting-balance support: Bilateral upper extremity supported;Feet supported Sitting balance-Leahy Scale: Poor Sitting balance - Comments: BUE support & CGA/min assist for dynamic sitting balance EOB   Standing balance support: Bilateral upper extremity supported;During functional activity Standing balance-Leahy Scale: Poor Standing balance comment: BUE support on RW & min/mod assist for dynamic balance  Cognition Arousal/Alertness: Awake/alert Behavior During Therapy: WFL for tasks assessed/performed Overall Cognitive Status:  Within Functional Limits for tasks assessed                                 General Comments: Very pleasant gentleman, motivated to participate.      Exercises     General Comments General comments (skin integrity, edema, etc.): >90% on room air, drain in place      Pertinent Vitals/Pain Pain Assessment: Faces Pain Score: 3  Faces Pain Scale: Hurts little more Pain Location: R shoulder Pain Descriptors / Indicators: Aching;Sore Pain Intervention(s): Monitored during session;Limited activity within patient's tolerance    Home Living Family/patient expects to be discharged to:: Private residence Living Arrangements: Spouse/significant other Available Help at Discharge: Family;Available PRN/intermittently Type of Home: House Home Access: Stairs to enter Entrance Stairs-Rails: Left Home Layout: One level Home Equipment: Walker - 2 wheels;Cane - single point;Wheelchair - manual;Grab bars - tub/shower;Grab bars - toilet;Hand held shower head;Adaptive equipment Additional Comments: had a reacher but it's broken    Prior Function Level of Independence: Needs assistance  Gait / Transfers Assistance Needed: 6 weeks ago pt was independent (going snow tubing) then began to need RW for mobility, 3 weeks ago pt required w/c for mobility ADL's / Homemaking Assistance Needed: spouse has recently been assisting with tub transfers and LB dressing; couple primarily gets take out/don't cook much, pt indep with med mgt, spouse has been driving since pt starting having more difficulty; pt wearing incontinence briefs recently 2/2 issues and spouse assisting particularly with overnight toileting to change briefs, have fabric chucks at home     PT Goals (current goals can now be found in the care plan section) Acute Rehab PT Goals Patient Stated Goal: to get better PT Goal Formulation: With patient Time For Goal Achievement: 08/22/20 Potential to Achieve Goals: Good Progress towards PT  goals: Progressing toward goals    Frequency    BID      PT Plan Current plan remains appropriate    Co-evaluation              AM-PAC PT "6 Clicks" Mobility   Outcome Measure  Help needed turning from your back to your side while in a flat bed without using bedrails?: A Lot Help needed moving from lying on your back to sitting on the side of a flat bed without using bedrails?: A Lot Help needed moving to and from a bed to a chair (including a wheelchair)?: A Lot Help needed standing up from a chair using your arms (e.g., wheelchair or bedside chair)?: A Little Help needed to walk in hospital room?: A Lot Help needed climbing 3-5 steps with a railing? : A Lot 6 Click Score: 13    End of Session Equipment Utilized During Treatment: Gait belt Activity Tolerance: Patient tolerated treatment well (pt notes fatigue at end of session) Patient left: in bed;with bed alarm set;with call bell/phone within reach;with SCD's reapplied;with family/visitor present   PT Visit Diagnosis: Muscle weakness (generalized) (M62.81);Difficulty in walking, not elsewhere classified (R26.2);Pain;Unsteadiness on feet (R26.81) Pain - Right/Left: Right Pain - part of body: Shoulder     Time: 3662-9476 PT Time Calculation (min) (ACUTE ONLY): 25 min  Charges:  $Therapeutic Activity: 23-37 mins                     Lavone Nian, PT, DPT 08/08/20, 3:07  PM    Waunita Schooner 08/08/2020, 3:03 PM

## 2020-08-08 NOTE — Evaluation (Signed)
Physical Therapy Evaluation Patient Details Name: Francisco [name] MRN: [id number] DOB: [birth year] Today's Date: 08/08/2020   History of Present Illness  Pt is a 74 y/o M with hx of cervical myelopathy & admitted on 08/07/20 for scheduled C3-6 Posterior fusion with decompression by Dr. Izora Ribas. PMH: atypical angina, PAD, carotid stenosis, HTN, HLD, COPD, Myasthenia gravis, hypothyroidism, CKD-III, OSA requiring nocturnal PAP therapy, OA, cervical DDD, lumbosacral spinal stenosis, chronic DOE 2/2 COPD  Clinical Impression  Pt is pleasant & motivated to participate. Two months ago pt was independent with all mobility (reports going snow tubing) then pt began experiencing a quick functional decline with pt relying on RW then w/c for mobility. Currently pt requires max assist (+2nd person present for safety) for sit<>stand & stand pivot transfers with RW. Pt demonstrates impaired functional use & coordination of all four extremities, as well as decreased strength. Pt is motivated to participate & would benefit from intense rehab at CIR to address deficits noted. Will continue to follow pt acutely to progress independence with bed mobility, transfers, & gait.   Of note: pt received on BSC in care of NT without cervical collar donned. PT dons collar in room (today wife plans to bring in aspen collar pt received from [place] prior to surgery) & educates pt on need to wear c-collar at all times unless supine in bed but pt elects to keep collar donned upon return supine.     Follow Up Recommendations CIR    Equipment Recommendations   (TBD in next venue)    Recommendations for Other Services Rehab consult     Precautions / Restrictions Precautions Precautions: Cervical;Fall Required Braces or Orthoses: Cervical Brace Cervical Brace: At all times;Hard collar (per orders: pt can remove brace when in bed, don brace supine) Restrictions Weight Bearing Restrictions: No      Mobility  Bed  Mobility Overal bed mobility: Needs Assistance Bed Mobility: Sit to Supine       Sit to supine: Mod assist;HOB elevated        Transfers Overall transfer level: Needs assistance Equipment used: Rolling walker (2 wheeled) Transfers: Sit to/from [facility] Sit to Stand: Max assist;+2 physical assistance (strong push on BSC during sit>stand, 2nd person present to stabilize [facility] & for safety) Stand pivot transfers: Max assist;+2 physical assistance (max assist & 2nd person present for safety)       General transfer comment: max cuing for safe hand placement on RW during sit>stand  Ambulation/Gait Ambulation/Gait assistance:  (pt is able to take 2 steps to L at EOB with max assist, RW & 2nd person present for safety)   Assistive device: Rolling walker (2 wheeled)          Stairs            Wheelchair Mobility    Modified Rankin (Stroke Patients Only)       Balance Overall balance assessment: Needs assistance Sitting-balance support: Bilateral upper extremity supported;Feet supported Sitting balance-Leahy Scale: Poor Sitting balance - Comments: BUE support for supervision static sitting balance, posterior lean when raising 1 LE off of floor in sitting with CGA/min assist from PT   Standing balance support: Bilateral upper extremity supported;During functional activity Standing balance-Leahy Scale: Zero Standing balance comment: BUE support on RW with max assist from PT                             Pertinent Vitals/Pain Pain Assessment: 0-10 Pain  Score: 7  Pain Location: neck & R shoulder Pain Descriptors / Indicators: Aching Pain Intervention(s): Monitored during session;Patient requesting pain meds-RN notified;Limited activity within patient's tolerance    Home Living Family/patient expects to be discharged to:: Private residence Living Arrangements: Spouse/significant other Available Help at Discharge: Family;Available  PRN/intermittently Type of Home: House Home Access: Stairs to enter Entrance Stairs-Rails: Left Entrance Stairs-Number of Steps: 3 Home Layout: One level Home Equipment: Walker - 2 wheels;Cane - single point;Wheelchair - manual      Prior Function           Comments: 6 weeks ago pt was independent (going snow tubing) then began to need RW for mobility, 3 weeks ago pt required w/c for mobility     Hand Dominance        Extremity/Trunk Assessment   Upper Extremity Assessment Upper Extremity Assessment:  (BUE weakness & impaired coordination when performing finger to nose test)    Lower Extremity Assessment Lower Extremity Assessment:  (grossly 3+/5 as pt able to stand with RW without buckling noted, limited AROM L ankle dorsiflexion but PROM WNL, impaired heel to shin BLE (L worse than R), BLE sensation intact to light touch but BLE proprioception impaired)    Cervical / Trunk Assessment Cervical / Trunk Assessment:  (rounded shoulders)  Communication   Communication: No difficulties  Cognition Arousal/Alertness: Awake/alert Behavior During Therapy: WFL for tasks assessed/performed Overall Cognitive Status: Within Functional Limits for tasks assessed                                 General Comments: Very pleasant gentleman, motivated to participate.      General Comments General comments (skin integrity, edema, etc.): Pt received on room air, SpO2 = 98% & pt left on room air - nurse notified    Exercises     Assessment/Plan    PT Assessment Patient needs continued PT services  PT Problem List Decreased strength;Decreased balance;Pain;Decreased knowledge of precautions;Decreased knowledge of use of DME;Decreased mobility;Decreased range of motion;Decreased activity tolerance;Decreased coordination;Decreased safety awareness;Impaired sensation       PT Treatment Interventions DME instruction;Patient/family education;Balance training;Functional  mobility training;Modalities;Gait training;Therapeutic activities;Neuromuscular re-education;Wheelchair mobility training;Manual techniques;Therapeutic exercise;Stair training    PT Goals (Current goals can be found in the Care Plan section)  Acute Rehab PT Goals Patient Stated Goal: to get better PT Goal Formulation: With patient Time For Goal Achievement: 08/22/20 Potential to Achieve Goals: Good    Frequency BID   Barriers to discharge Decreased caregiver support;Inaccessible home environment unsure if wife can provide physical assist at d/c, stairs to enter home    Co-evaluation               AM-PAC PT "6 Clicks" Mobility  Outcome Measure Help needed turning from your back to your side while in a flat bed without using bedrails?: A Lot Help needed moving from lying on your back to sitting on the side of a flat bed without using bedrails?: A Lot Help needed moving to and from a bed to a chair (including a wheelchair)?: Total Help needed standing up from a chair using your arms (e.g., wheelchair or bedside chair)?: Total Help needed to walk in hospital room?: Total Help needed climbing 3-5 steps with a railing? : Total 6 Click Score: 8    End of Session Equipment Utilized During Treatment: Gait belt Activity Tolerance: Patient tolerated treatment well Patient left: in bed;with bed  alarm set;with call bell/phone within reach;with SCD's reapplied Nurse Communication: Mobility status (O2, inability to self feed) PT Visit Diagnosis: Muscle weakness (generalized) (M62.81);Difficulty in walking, not elsewhere classified (R26.2);Pain;Unsteadiness on feet (R26.81) Pain - Right/Left: Right Pain - part of body: Shoulder (neck)    Time: 5374-8270 PT Time Calculation (min) (ACUTE ONLY): 37 min   Charges:   PT Evaluation $PT Eval Moderate Complexity: 1 Mod PT Treatments $Therapeutic Activity: 23-37 mins        Lavone Nian, PT, DPT 08/08/20, 9:49 AM   Waunita Schooner 08/08/2020, 9:45 AM

## 2020-08-08 NOTE — Progress Notes (Signed)
Inpatient Rehab Admissions Coordinator:   Met with patient at bedside to discuss potential CIR admission. Pt. Stated interest. [employer] Medicare is not open over the weekend, so I will not be able to open a case until Monday and they have 1-2 days to issue an approval or denial. If  Planning to d/c over the weekend, CIR cannot take Pt. Will pursue for potential admit this week, pending bed availability and insurance auth.   Clemens Catholic, [place], [place] Admissions Coordinator  [phone] ([place]) [phone] (office)

## 2020-08-08 NOTE — Progress Notes (Signed)
Inpatient Rehab Admissions Coordinator Note:   Per therapy recommendations, pt was screened for CIR candidacy by Canden Cieslinski, MS CCC-SLP. At this time, Pt. Appears to have functional decline and is a good candidate for CIR. Will pursue order for rehab consult per protocol.  Please contact me with questions.   Lagretta Loseke, MS, CCC-SLP Rehab Admissions Coordinator  [phone] ([place]l) [phone] (office)   

## 2020-08-09 MED ORDER — GABAPENTIN 300 MG PO CAPS
300.0000 mg | ORAL_CAPSULE | Freq: Three times a day (TID) | ORAL | Status: DC
  Administered 2020-08-09 – 2020-08-11 (×8): 300 mg via ORAL
  Filled 2020-08-09 (×8): qty 1

## 2020-08-09 NOTE — Progress Notes (Signed)
Physical Therapy Treatment Patient Details Name: Francisco [name] MRN: [id number] DOB: 04/23/1946 Today's Date: 08/09/2020    History of Present Illness Pt is a 74 y/o M with hx of cervical myelopathy & admitted on 08/07/20 for scheduled C3-6 Posterior fusion with decompression by Dr. Izora Ribas. PMH: atypical angina, PAD, carotid stenosis, HTN, HLD, COPD, Myasthenia gravis, hypothyroidism, CKD-III, OSA requiring nocturnal PAP therapy, OA, cervical DDD, lumbosacral spinal stenosis, chronic DOE 2/2 COPD    PT Comments    Pt in recliner, stated he is feeling better but poor sleep in bed.  He stated he got up easier today with +2 assist from nursing.  He stands from recliner with min a x 1 and good recall of hand placements.  Session focused on increasing standing tolerance and he is able to stand x 2 for several minutes with no buckling or LOB.  Hesitant to do standing ex without +2 assist so gait was deferred today.  Once sitting he self directs seated ex for about 10 minutes.  Stated overall feeling is returning to LE's and is pleased with his progress to date.  Remained in recliner and voices being excited for possibility of CIR this week.   Follow Up Recommendations  CIR     Equipment Recommendations  Rolling walker with 5" wheels    Recommendations for Other Services       Precautions / Restrictions Precautions Precautions: Cervical;Fall Precaution Comments: no bending, lifting >10lbs, twisting, arching Required Braces or Orthoses: Cervical Brace Cervical Brace: At all times;Hard collar (per orders: pt can remove brace when in bed, don brace supine) Restrictions Other Position/Activity Restrictions: Pt stating today MD said he only needed to wear brace when walking/transfering and could take it off in chair.    Mobility  Bed Mobility               General bed mobility comments: in recliner before and after session  Transfers Overall transfer level: Needs  assistance Equipment used: Rolling walker (2 wheeled) Transfers: Sit to/from Stand Sit to Stand: Min assist         General transfer comment: remembered hand placements and overall does well.  Ambulation/Gait             General Gait Details: deferred for safety and need of +2 assist.  focused on extended standing tolerance at chairside.   Stairs             Wheelchair Mobility    Modified Rankin (Stroke Patients Only)       Balance Overall balance assessment: Needs assistance Sitting-balance support: Bilateral upper extremity supported;Feet supported Sitting balance-Leahy Scale: Fair     Standing balance support: Bilateral upper extremity supported;During functional activity Standing balance-Leahy Scale: Poor Standing balance comment: BUE support on RW & min/mod assist for dynamic balance                            Cognition Arousal/Alertness: Awake/alert Behavior During Therapy: WFL for tasks assessed/performed Overall Cognitive Status: Within Functional Limits for tasks assessed                                        Exercises Other Exercises Other Exercises: extended self directed seated exercises for LAQ, marches and ab/add for 10 minutes    General Comments        Pertinent Vitals/Pain Pain  Assessment: Faces Faces Pain Scale: Hurts little more Pain Location: R shoulder Pain Descriptors / Indicators: Aching;Sore Pain Intervention(s): Limited activity within patient's tolerance;Monitored during session;Repositioned    Home Living                      Prior Function            PT Goals (current goals can now be found in the care plan section) Progress towards PT goals: Progressing toward goals    Frequency    BID      PT Plan Current plan remains appropriate    Co-evaluation              AM-PAC PT "6 Clicks" Mobility   Outcome Measure  Help needed turning from your back to your  side while in a flat bed without using bedrails?: A Lot Help needed moving from lying on your back to sitting on the side of a flat bed without using bedrails?: A Lot Help needed moving to and from a bed to a chair (including a wheelchair)?: A Lot Help needed standing up from a chair using your arms (e.g., wheelchair or bedside chair)?: A Little Help needed to walk in hospital room?: A Lot Help needed climbing 3-5 steps with a railing? : A Lot 6 Click Score: 13    End of Session Equipment Utilized During Treatment: Gait belt Activity Tolerance: Patient tolerated treatment well Patient left: in chair;with call bell/phone within reach;with family/visitor present Nurse Communication: Mobility status Pain - Right/Left: Right Pain - part of body: Shoulder     Time: 6333-5456 PT Time Calculation (min) (ACUTE ONLY): 23 min  Charges:  $Therapeutic Exercise: 8-22 mins $Therapeutic Activity: 8-22 mins                    Chesley Noon, PTA 08/09/20, 11:28 AM

## 2020-08-09 NOTE — Progress Notes (Signed)
    Attending Progress Note  History: Francisco [name] is here for cervical myelopathy.  POD2: Mr. Eppinger has noted improvement in movement of his hands, and was able to take small steps yesterday.  He has hypersensitivity in his R shoulder.    POD1: Mr. Habib has noted improvement in his hand symptoms.  He has mild neck pain.    Physical Exam: Vitals:   08/09/20 0400 08/09/20 0800  BP: (!) 114/59 138/67  Pulse: 69 (!) 59  Resp: 16 16  Temp: 98.5 F (36.9 C) 97.7 F (36.5 C)  SpO2: 93% 95%    AA Ox3 CNI  Strength:5/5 throughout BUE except B deltoid and grip, which are 4+/5 4+-5/5 BLE  Incision c/d/i  Drain 20  Data:  No results for input(s): NA, K, CL, CO2, BUN, CREATININE, LABGLOM, GLUCOSE, CALCIUM in the last 168 hours. No results for input(s): AST, ALT, ALKPHOS in the last 168 hours.  Invalid input(s): TBILI   No results for input(s): WBC, HGB, HCT, PLT in the last 168 hours. No results for input(s): APTT, INR in the last 168 hours.       Other tests/results: none to review  Assessment/Plan:  [place] VANOVER is doing well after C3-6 PSFD.    - mobilize - pain control - DVT prophylaxis - enoxaparin - PTOT - monitor drain output.  - dispo likely to inpatient rehab when stable   Meade Maw MD, [facility] Department of Neurosurgery

## 2020-08-09 NOTE — PMR Pre-admission (Signed)
PMR Admission Coordinator Pre-Admission Assessment  Patient: Francisco [name] is an 74 y.o., male MRN: [id number] DOB: May 22, 1946 Height: $RemoveBefo'5\' 10"'dctStxXgMSz$  (177.8 cm) Weight: 108 kg  Insurance Information HMO:     PPO:      PCP:      IPA:      80/20:      OTHER:  PRIMARY: U[employer] Medicare       Policy#: [id number]      Subscriber: Pt. CM Name:      Phone#:  [phone]     Fax#: [phone] Bernadene Bell) Pre-Cert#: [id number]       Employer:  Benefits:  Phone #: #:  [phone]      Name:   Eff. Date: 12/12/2019 - 09/11/2020  Deduct: does not have ($0)  Out of Pocket Max: $3,900 ($2,317.66 met)     Life Max: n/a CIR: $345/day co-pay for days 1-5, $0/day co-pay for days 6+ SNF: $0/day copay for days 1-20, $184/day copay for days 21-42, $0/day copay for days 43-100; limited to 100 days/cal yr Outpatient: $35/visit co-pay; limited by medical necessity Home Health: 100% coverage, 0% co-insurance; limited by medical necessity DME:80% coverage; 20% co-insurance Providers: In network  SECONDARY: none        The "Data Collection Information Summary" for patients in Inpatient Rehabilitation Facilities with attached "Privacy Act [place] Records" was provided and verbally reviewed with: Patient and Family  Emergency Contact Information Contact Information    Name Relation Home Work Mobile   Auxier T Spouse [phone]  [phone]      Current Medical History  Patient Admitting Diagnosis: Cervical Myelopathy s/p fusion with decompression History of Present Illness: Pt is a 74 y/o male with past medical history significant for atypical angina, PAD, carotid stenosis, HTN, HLD, COPD, Myasthenia gravis, hypothyroidism, CKD-III, OSA requiring nocturnal PAP therapy, OA, cervical DDD, lumbosacral spinal stenosis, chronic DOE 2/2 COPD of cervical myelopathy & admitted on 08/07/20 for scheduled C3-6 Posterior fusion with decompression by Dr. Izora Ribas. Pt. Admitted following procedure and  therapies noted significant functional decline. PT/OT are recommending CIR following discharge.   Patient's medical record from [facility]  has been reviewed by the rehabilitation admission coordinator and physician.  Past Medical History  Past Medical History:  Diagnosis Date  . Arthritis    lower left hip  . Atypical angina ([place])   . Bilateral hand numbness    from back surgery  . Bronchitis, chronic ([place])   . 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 Carotid stenosis   . CKD (chronic kidney disease) stage 3, GFR 30-59 ml/min (HCC)   . 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 DDD (degenerative disc disease), cervical   . Hypercholesterolemia   . Hypertension   . Hypothyroidism    pt takes Levothyroxine daily  . Lumbosacral spinal stenosis   . Myasthenia gravis, adult form ([place])   . PAD (peripheral artery disease) ([place])   . Shortness of breath    Lung MD- Dr Darlin Coco  . Sleep apnea    do not use CPAP every night    Family History   family history includes Hypertension in his mother; Stroke in his father and mother.  Prior Rehab/Hospitalizations Has the patient had prior rehab or hospitalizations prior to admission? Yes  Has the patient had major surgery during 100 days prior to admission? Yes   Current  Medications  Current Facility-Administered Medications:  .  acetaminophen (TYLENOL) tablet 650 mg, 650 mg, Oral, Q6H PRN, Meade Maw, MD .  alum & mag hydroxide-simeth (MAALOX/MYLANTA) 200-200-20 MG/5ML suspension 30 mL, 30 mL, Oral, Q6H PRN, Zdeb, Christine, NP .  azaTHIOprine (IMURAN) tablet 150 mg, 150 mg, Oral, Daily, Zdeb, Christine, NP, 150 mg at 08/11/20 0910 .  bisacodyl (DULCOLAX) EC tablet 5 mg, 5 mg, Oral, Daily PRN, Zdeb, Christine, NP .  celecoxib (CELEBREX) capsule 100 mg, 100 mg, Oral, Q12H, Zdeb, Christine, NP, 100 mg at  08/11/20 0911 .  docusate sodium (COLACE) capsule 100 mg, 100 mg, Oral, BID, Zdeb, Christine, NP, 100 mg at 08/11/20 0910 .  enoxaparin (LOVENOX) injection 55 mg, 0.5 mg/kg, Subcutaneous, Q24H, Meade Maw, MD, 55 mg at 08/10/20 2259 .  gabapentin (NEURONTIN) capsule 300 mg, 300 mg, Oral, TID, Meade Maw, MD, 300 mg at 08/11/20 0910 .  levothyroxine (SYNTHROID) tablet 88 mcg, 88 mcg, Oral, Daily, Zdeb, Christine, NP, 88 mcg at 08/11/20 0620 .  losartan (COZAAR) tablet 50 mg, 50 mg, Oral, BID, Zdeb, Christine, NP, 50 mg at 08/10/20 2257 .  magnesium oxide (MAG-OX) tablet 400 mg, 400 mg, Oral, Daily, Meade Maw, MD, 400 mg at 08/11/20 0910 .  menthol-cetylpyridinium (CEPACOL) lozenge 3 mg, 1 lozenge, Oral, PRN **OR** phenol (CHLORASEPTIC) mouth spray 1 spray, 1 spray, Mouth/Throat, PRN, Zdeb, Christine, NP .  ondansetron (ZOFRAN) tablet 4 mg, 4 mg, Oral, Q6H PRN **OR** ondansetron (ZOFRAN) injection 4 mg, 4 mg, Intravenous, Q6H PRN, Zdeb, Christine, NP .  oxybutynin (DITROPAN) tablet 5 mg, 5 mg, Oral, TID, Zdeb, Christine, NP, 5 mg at 08/11/20 0910 .  oxyCODONE (Oxy IR/ROXICODONE) immediate release tablet 10 mg, 10 mg, Oral, Q3H PRN, Zdeb, Christine, NP, 10 mg at 08/11/20 1133 .  oxyCODONE (Oxy IR/ROXICODONE) immediate release tablet 5 mg, 5 mg, Oral, Q3H PRN, Zdeb, Christine, NP, 5 mg at 08/11/20 2919 .  pantoprazole (PROTONIX) EC tablet 40 mg, 40 mg, Oral, BID AC, Zdeb, Christine, NP, 40 mg at 08/11/20 0910 .  polyethylene glycol (MIRALAX / GLYCOLAX) packet 17 g, 17 g, Oral, Daily PRN, Zdeb, Christine, NP .  predniSONE (DELTASONE) tablet 20 mg, 20 mg, Oral, QODAY, Zdeb, Christine, NP, 20 mg at 08/10/20 1118 .  pyridostigmine (MESTINON) tablet 60 mg, 60 mg, Oral, Q12H, Zdeb, Christine, NP, 60 mg at 08/11/20 0911 .  rOPINIRole (REQUIP) tablet 4 mg, 4 mg, Oral, QHS, Zdeb, Christine, NP, 4 mg at 08/10/20 2257 .  senna (SENOKOT) tablet 17.2 mg, 2 tablet, Oral, BID, Zdeb, Christine,  NP, 17.2 mg at 08/11/20 0910 .  sodium chloride flush (NS) 0.9 % injection 3 mL, 3 mL, Intravenous, Q12H, Zdeb, Christine, NP, 3 mL at 08/11/20 0914 .  sodium chloride flush (NS) 0.9 % injection 3 mL, 3 mL, Intravenous, PRN, Zdeb, Christine, NP, 3 mL at 08/10/20 1124 .  sodium phosphate (FLEET) 7-19 GM/118ML enema 1 enema, 1 enema, Rectal, Once PRN, Zdeb, Christine, NP  Patients Current Diet:  Diet Order            Diet regular Room service appropriate? Yes; Fluid consistency: Thin  Diet effective now                 Precautions / Restrictions Precautions Precautions: Cervical, Fall Precaution Booklet Issued: Yes (comment) Precaution Comments: no bending, lifting >10lbs, twisting, arching Cervical Brace: At all times, Hard collar Restrictions Weight Bearing Restrictions: No Other Position/Activity Restrictions: Pt stating today MD said he only needed to wear  brace when walking/transfering and could take it off in chair.   Has the patient had 2 or more falls or a fall with injury in the past year? Yes  Prior Activity Level Community (5-7x/wk): Pt. went out frequently and driving PTA  Prior Functional Level Self Care: Did the patient need help bathing, dressing, using the toilet or eating? Independent  Indoor Mobility: Did the patient need assistance with walking from room to room (with or without device)? Independent  Stairs: Did the patient need assistance with internal or external stairs (with or without device)? Independent  Functional Cognition: Did the patient need help planning regular tasks such as shopping or remembering to take medications? [place] / Equipment Home Assistive Devices/Equipment: Gilford Rile (specify type), Oxygen Home Equipment: Walker - 2 wheels, Cane - single point, Wheelchair - manual, Grab bars - tub/shower, Grab bars - toilet, Hand held shower head, Adaptive equipment  Prior Device Use: Indicate devices/aids used by the  patient prior to current illness, exacerbation or injury? Walker  Current Functional Level Cognition  Overall Cognitive Status: Within Functional Limits for tasks assessed Orientation Level: Oriented X4 General Comments: cues for redirecting/safety at times    Extremity Assessment (includes Sensation/Coordination)  Upper Extremity Assessment: RUE deficits/detail, LUE deficits/detail RUE Deficits / Details: limited shoulder strength assessment 2/2 pain in R neck/shoulder, good elbow ext/flex, good grip, impaired FMC, hand eye, RAM, and sensation in hand/fingers causing pt to have difficulty with sustained motor control for [facility] tasks including self feeding, grooming RUE: Unable to fully assess due to pain RUE Sensation: decreased light touch, decreased proprioception RUE Coordination: decreased fine motor LUE Deficits / Details: fair shoulder, good elbow ext/flex, good grip, impaired FMC, hand eye, RAM, and sensation in hand/fingers as well as UE causing pt to have difficulty with sustained motor control for [facility] tasks including self feeding, grooming LUE Sensation: decreased light touch, decreased proprioception LUE Coordination: decreased fine motor  Lower Extremity Assessment:  (grossly 3+/5 as pt able to stand with RW without buckling noted, limited AROM L ankle dorsiflexion but PROM WNL, impaired heel to shin BLE (L slightly worse than R), mild decreased sensation LLE, BLE proprioception impaired; B foot +1 pitting edema)    ADLs  Overall ADL's : Needs assistance/impaired Eating/Feeding: With caregiver independent assisting Eating/Feeding Details (indicate cue type and reason): spouse feeding pt upon OT's arrival Grooming: Sitting, Minimal assistance Upper Body Bathing: Sitting, Minimal assistance, Moderate assistance Lower Body Bathing: Sitting/lateral leans, Moderate assistance Upper Body Dressing : Sitting, Minimal assistance, Moderate assistance Lower Body Dressing: Sitting/lateral  leans, Moderate assistance Toilet Transfer: RW, Ambulation, BSC, Cueing for sequencing, Cueing for safety, Minimal assistance Toileting- Clothing Manipulation and Hygiene: Maximal assistance, Sit to/from stand Toileting - Clothing Manipulation Details (indicate cue type and reason): pt typically will use grab bar at home in front of him to pull up; CGA-Min A to stand with RW and spouse performed pericare Functional mobility during ADLs: Minimal assistance, Cueing for sequencing, Cueing for safety, Rolling walker    Mobility  Overal bed mobility: Needs Assistance Bed Mobility: Rolling, Sidelying to Sit, Sit to Supine Rolling: Min assist (bed rails) Sidelying to sit: Min assist, HOB elevated (bed rails) Supine to sit: Mod assist, Min assist Sit to supine: Mod assist, HOB elevated Sit to sidelying: Mod assist General bed mobility comments: in recliner before and after session    Transfers  Overall transfer level: Needs assistance Equipment used: Rolling walker (2 wheeled) Transfers: Sit to/from Stand Sit to  Stand: Min assist Stand pivot transfers: Max assist, +2 physical assistance (max assist & 2nd person present for safety) General transfer comment: Cues for hand placement and to scoot towards edge of recliner prior to attempt with improved performance noted on 2nd attempt    Ambulation / Gait / Stairs / Wheelchair Mobility  Ambulation/Gait Ambulation/Gait assistance: Min assist, +2 safety/equipment Gait Distance (Feet): 30 Feet Assistive device: Rolling walker (2 wheeled) Gait Pattern/deviations: Step-through pattern, Decreased step length - right, Decreased step length - left General Gait Details: +2 assist with +3 for recliner follow for safety.  Generally unsteady with L knee hyperextension noted. Gait velocity: decreased    Posture / Balance Dynamic Sitting Balance Sitting balance - Comments: BUE support & CGA/min assist for dynamic sitting balance EOB Balance Overall balance  assessment: Needs assistance Sitting-balance support: Bilateral upper extremity supported, Feet supported Sitting balance-Leahy Scale: Fair Sitting balance - Comments: BUE support & CGA/min assist for dynamic sitting balance EOB Standing balance support: Bilateral upper extremity supported, During functional activity Standing balance-Leahy Scale: Poor Standing balance comment: BUE support on RW & min/mod assist for dynamic balance    Special needs/care consideration Skin : Surgical incision    Previous Home Environment (from acute therapy documentation) Living Arrangements: Spouse/significant other  Lives With: Spouse Available Help at Discharge: Family, Available PRN/intermittently Type of Home: House Home Layout: One level Home Access: Stairs to enter Entrance Stairs-Rails: Left Entrance Stairs-Number of Steps: 3 Bathroom Shower/Tub: Chiropodist: Handicapped height Bathroom Accessibility: Yes Home Care Services: No Additional Comments: had a reacher but it's broken  Discharge Living Setting Plans for Discharge Living Setting: Patient's home Type of Home at Discharge: House Discharge Home Layout: One level Discharge Home Access: Stairs to enter Entrance Stairs-Rails: Left Entrance Stairs-Number of Steps: 3 (3) Discharge Bathroom Shower/Tub: Tub/shower unit Discharge Bathroom Toilet: Handicapped height Discharge Bathroom Accessibility: Yes How Accessible: Accessible via walker Does the patient have any problems obtaining your medications?: No  Social/Family/Support Systems Patient Roles: Spouse Contact Information: [phone]) Anticipated Caregiver: Joslyn Devon Anticipated Caregiver's Contact Information: [phone] Ability/Limitations of Caregiver: Can provide min A Caregiver Availability: 24/7 Discharge Plan Discussed with Primary Caregiver: Yes Is Caregiver In Agreement with Plan?: Yes Does Caregiver/Family have Issues with  Lodging/Transportation while Pt is in Rehab?: Yes  Goals Patient/Family Goal for Rehab: PT/OT Supervision  Expected length of stay: 8-10 days Pt/Family Agrees to Admission and willing to participate: Yes Program Orientation Provided & Reviewed with Pt/Caregiver Including Roles  & Responsibilities: Yes  Decrease burden of Care through IP rehab admission: Specialzed equipment needs, Decrease number of caregivers, Bowel and bladder program and Patient/family education  Possible need for SNF placement upon discharge: not anticipated   Patient Condition: I have reviewed medical records from [facility], spoken with CM, and patient and spouse. I met with patient at the bedside for inpatient rehabilitation assessment.  Patient will benefit from ongoing PT and OT, can actively participate in 3 hours of therapy a day 5 days of the week, and can make measurable gains during the admission.  Patient will also benefit from the coordinated team approach during an Inpatient Acute Rehabilitation admission.  The patient will receive intensive therapy as well as Rehabilitation physician, nursing, social worker, and care management interventions.  Due to bladder management, safety, skin/wound care, disease management, medication administration, pain management and patient education the patient requires 24 hour a day rehabilitation nursing.  The patient is currently min A-mod A with mobility and  basic ADLs.  Discharge setting and therapy post discharge at home with home health is anticipated.  Patient has agreed to participate in the Acute Inpatient Rehabilitation Program and will admit today.  Preadmission Screen Completed By:  Genella Mech, 08/11/2020 12:26 PM ______________________________________________________________________   Discussed status with Dr. Ranell Patrick  on 08/11/20  at 900 am and received approval for admission today.  Admission Coordinator:  Genella Mech, CCC-SLP, time  1227/Date 08/11/20   Assessment/Plan: Diagnosis: Cervical myelopathy s/p C3-C6 PSFD 1. Does the need for close, 24 hr/day Medical supervision in concert with the patient's rehab needs make it unreasonable for this patient to be served in a less intensive setting? Yes 2. Co-Morbidities requiring supervision/potential complications: lumbar spinal stenosis with neurogenic claudication, COPD, myasthenia gravis, benign essential hypertension, bilateral carotid artery disease, anemia, history of prostate cancer 3. Due to bladder management, bowel management, safety, skin/wound care, disease management, medication administration, pain management and patient education, does the patient require 24 hr/day rehab nursing? Yes 4. Does the patient require coordinated care of a physician, rehab nurse, PT, OT to address physical and functional deficits in the context of the above medical diagnosis(es)? Yes Addressing deficits in the following areas: balance, endurance, locomotion, strength, transferring, bowel/bladder control, bathing, dressing, feeding, grooming, toileting and psychosocial support 5. Can the patient actively participate in an intensive therapy program of at least 3 hrs of therapy 5 days a week? Yes 6. The potential for patient to make measurable gains while on inpatient rehab is excellent 7. Anticipated functional outcomes upon discharge from inpatient rehab: modified independent PT, modified independent OT, independent SLP 8. Estimated rehab length of stay to reach the above functional goals is: 9-11 days  9. Anticipated discharge destination: Home 10. Overall Rehab/Functional Prognosis: excellent   MD Signature: Leeroy Cha, MD

## 2020-08-10 MED ORDER — ACETAMINOPHEN 325 MG PO TABS: 650.0000 mg | ORAL_TABLET | Freq: Four times a day (QID) | ORAL | Status: DC | PRN

## 2020-08-10 NOTE — Progress Notes (Signed)
Attending Progress Note  History: DAMIN SALIDO is here for cervical myelopathy.  POD#3 from C3-6 PCDF  Subjective: Mr. Creppel appears to be doing well and states that he feels his legs are stronger this morning.  He is able to stand with assistance.  His drain is put out 25 cc and will be removed today.  His dressing was changed and incision appears to be healing appropriately.  He does not endorse any significant pain.    Physical Exam: Vitals:   08/10/20 0432 08/10/20 0511  BP: 138/63 (!) 126/58  Pulse: (!) 52 61  Resp: 16 18  Temp: 97.9 F (36.6 C) 97.7 F (36.5 C)  SpO2: 98% 98%    AA Ox3 Strength:5/5 throughout BUE with the exception of 4+ out of 5 in deltoid and grip 5/5 BLE in all motor groups Sensation decreased in bilateral hands, at baseline  Incision c/d/i  Drain 25 cc, removed  Data:  Other tests/results: none to review  Assessment/Plan:  SEGER JANI is doing well after C3-6 PSFD.    - mobilize, working with PT/OT - pain control adequate on oral analgesics - DVT prophylaxis - enoxaparin - drain removed 11/29 - tolerating regular diet - in Aspen collar - dispo likely to inpatient rehab when stable   Deetta Perla, MD Department of Neurosurgery

## 2020-08-10 NOTE — Progress Notes (Signed)
Physical Therapy Treatment Patient Details Name: Francisco [name] MRN: [id number] DOB: [birth year] Today's Date: 08/10/2020    History of Present Illness Pt is a 74 y/o M with hx of cervical myelopathy & admitted on 08/07/20 for scheduled C3-6 Posterior fusion with decompression by Dr. Izora Ribas. PMH: atypical angina, PAD, carotid stenosis, HTN, HLD, COPD, Myasthenia gravis, hypothyroidism, CKD-III, OSA requiring nocturnal PAP therapy, OA, cervical DDD, lumbosacral spinal stenosis, chronic DOE 2/2 COPD    PT Comments    Pt in chair, ready for session.  Stood and is able to progress gait 30' in hallway with RW and +2 min assist with a third assist for a recliner follow.  He remains generally unsteady and with hyperextension noted L LE.  At times he needs increased time to gait his balance and cues to do so before walking.  He is pleased with his progress today and does ask to sit on commode upon return to room.    He continues to need +2 assist for transfers and +3 for gait at this time for safety.  CIR remains appropriate and he is excited over possible transfer to CIR this week.  Wife in and supportive.  He does report continued pain in R shoulder which he has discussed with neurosurgery.  He reports decreased sensation R hand and voices difficulty donning his mask as he cannot "feel it."  Will discuss with RN about OT consult.   Follow Up Recommendations  CIR, OT consult     Equipment Recommendations  Rolling walker with 5" wheels    Recommendations for Other Services       Precautions / Restrictions Precautions Precautions: Cervical;Fall Precaution Comments: no bending, lifting >10lbs, twisting, arching Required Braces or Orthoses: Cervical Brace Cervical Brace: At all times;Hard collar (per orders: pt can remove brace when in bed, don brace supine) Restrictions Other Position/Activity Restrictions: Pt stating today MD said he only needed to wear brace when walking/transfering  and could take it off in chair.    Mobility  Bed Mobility               General bed mobility comments: in recliner before and after session  Transfers Overall transfer level: Needs assistance Equipment used: Rolling walker (2 wheeled) Transfers: Sit to/from Stand Sit to Stand: Min assist            Ambulation/Gait Ambulation/Gait assistance: Min assist;+2 safety/equipment Gait Distance (Feet): 30 Feet Assistive device: Rolling walker (2 wheeled) Gait Pattern/deviations: Step-through pattern;Decreased step length - right;Decreased step length - left Gait velocity: decreased   General Gait Details: +2 assist with +3 for recliner follow for safety.  Generally unsteady with L knee hyperextension noted.   Stairs             Wheelchair Mobility    Modified Rankin (Stroke Patients Only)       Balance Overall balance assessment: Needs assistance Sitting-balance support: Bilateral upper extremity supported;Feet supported Sitting balance-Leahy Scale: Fair Sitting balance - Comments: BUE support & CGA/min assist for dynamic sitting balance EOB   Standing balance support: Bilateral upper extremity supported;During functional activity Standing balance-Leahy Scale: Poor Standing balance comment: BUE support on RW & min/mod assist for dynamic balance                            Cognition Arousal/Alertness: Awake/alert Behavior During Therapy: WFL for tasks assessed/performed Overall Cognitive Status: Within Functional Limits for tasks assessed  Exercises Other Exercises Other Exercises: seated LAQ and marches x 10    General Comments        Pertinent Vitals/Pain Pain Assessment: Faces Faces Pain Scale: Hurts little more Pain Location: R shoulder Pain Descriptors / Indicators: Aching;Sore Pain Intervention(s): Limited activity within patient's tolerance;Monitored during session    Home  Living                      Prior Function            PT Goals (current goals can now be found in the care plan section) Progress towards PT goals: Progressing toward goals    Frequency    BID      PT Plan Current plan remains appropriate    Co-evaluation              AM-PAC PT "6 Clicks" Mobility   Outcome Measure  Help needed turning from your back to your side while in a flat bed without using bedrails?: A Lot Help needed moving from lying on your back to sitting on the side of a flat bed without using bedrails?: A Lot Help needed moving to and from a bed to a chair (including a wheelchair)?: A Lot Help needed standing up from a chair using your arms (e.g., wheelchair or bedside chair)?: A Little Help needed to walk in hospital room?: A Lot Help needed climbing 3-5 steps with a railing? : A Lot 6 Click Score: 13    End of Session Equipment Utilized During Treatment: Gait belt Activity Tolerance: Patient tolerated treatment well Patient left: with call bell/phone within reach;with family/visitor present;Other (comment) Nurse Communication: Mobility status Pain - Right/Left: Right Pain - part of body: Shoulder     Time: 4801-6553 PT Time Calculation (min) (ACUTE ONLY): 15 min  Charges:  $Gait Training: 8-22 mins                    Chesley Noon, PTA 08/10/20, 1:23 PM

## 2020-08-10 NOTE — Progress Notes (Signed)
Occupational Therapy Treatment Patient Details Name: Francisco [name] MRN: [id number] DOB: 11/14/1945 Today's Date: 08/10/2020    History of present illness Pt is a 74 y/o M with hx of cervical myelopathy & admitted on 08/07/20 for scheduled C3-6 Posterior fusion with decompression by Dr. Izora Ribas. PMH: atypical angina, PAD, carotid stenosis, HTN, HLD, COPD, Myasthenia gravis, hypothyroidism, CKD-III, OSA requiring nocturnal PAP therapy, OA, cervical DDD, lumbosacral spinal stenosis, chronic DOE 2/2 COPD   OT comments  Pt seen for OT tx this date. Pt up in recliner, no brace (ok per neurosurgery), spouse in room. Pt reports some R shoulder and sacral discomfort but otherwise doing well and in good spirits, eager to get to CIR. Functional transfer training from recliner with improved performance noted with cues for hand placement and scooting towards the edge of the chair prior to attempts - still requiring Min A to complete. Pillow placed in chair once in standing with RW. Pt endorsed improved comfort with pillow placed. Pt/spouse instructed in cervical precautions and reminders. Pt agreeable to putting brace back on given how much active cervical ROM he was doing while conversing, requiring intermittent VC cues from therapist to correct. Pt continues to progress towards goals, continue to recommend CIR for maximal return to PLOF.    Follow Up Recommendations  CIR    Equipment Recommendations  3 in 1 bedside commode    Recommendations for Other Services Rehab consult    Precautions / Restrictions Precautions Precautions: Cervical;Fall Precaution Comments: no bending, lifting >10lbs, twisting, arching Required Braces or Orthoses: Cervical Brace (per orders: pt can remove brace when in bed, don brace supine) Cervical Brace: At all times;Hard collar Restrictions Other Position/Activity Restrictions: Pt stating today MD said he only needed to wear brace when walking/transfering and could  take it off in chair.       Mobility Bed Mobility               General bed mobility comments: in recliner before and after session  Transfers Overall transfer level: Needs assistance Equipment used: Rolling walker (2 wheeled) Transfers: Sit to/from Stand Sit to Stand: Min assist         General transfer comment: Cues for hand placement and to scoot towards edge of recliner prior to attempt with improved performance noted on 2nd attempt    Balance Overall balance assessment: Needs assistance Sitting-balance support: Bilateral upper extremity supported;Feet supported Sitting balance-Leahy Scale: Fair Sitting balance - Comments: BUE support & CGA/min assist for dynamic sitting balance EOB   Standing balance support: Bilateral upper extremity supported;During functional activity Standing balance-Leahy Scale: Poor Standing balance comment: BUE support on RW & min/mod assist for dynamic balance                           ADL either performed or assessed with clinical judgement   ADL Overall ADL's : Needs assistance/impaired                                             Vision       Perception     Praxis      Cognition Arousal/Alertness: Awake/alert Behavior During Therapy: WFL for tasks assessed/performed Overall Cognitive Status: Within Functional Limits for tasks assessed  General Comments: cues for redirecting/safety at times        Exercises Other Exercises Other Exercises: extensive education regarding HEP - return demo Other Exercises: Pt educated in safety and cervical precautions, as pt noted to rotate his head and do more cervical movement than should while out of brace seated in recliner Other Exercises: Functional transfer training, pillow placed in seat of recliner for improved comfort   Shoulder Instructions       General Comments      Pertinent Vitals/ Pain        Pain Assessment: Faces Faces Pain Scale: Hurts little more Pain Location: R shoulder Pain Descriptors / Indicators: Aching;Sore Pain Intervention(s): Limited activity within patient's tolerance;Monitored during session  Home Living                                          Prior Functioning/Environment              Frequency  Min 3X/week        Progress Toward Goals  OT Goals(current goals can now be found in the care plan section)  Progress towards OT goals: Progressing toward goals  Acute Rehab OT Goals Patient Stated Goal: to get better OT Goal Formulation: With patient/family Time For Goal Achievement: 08/22/20 Potential to Achieve Goals: Good  Plan Discharge plan remains appropriate;Frequency remains appropriate    Co-evaluation                 AM-PAC OT "6 Clicks" Daily Activity     Outcome Measure   Help from another person eating meals?: A Little Help from another person taking care of personal grooming?: A Little Help from another person toileting, which includes using toliet, bedpan, or urinal?: A Little Help from another person bathing (including washing, rinsing, drying)?: A Lot Help from another person to put on and taking off regular upper body clothing?: A Little Help from another person to put on and taking off regular lower body clothing?: A Lot 6 Click Score: 16    End of Session    OT Visit Diagnosis: Other abnormalities of gait and mobility (R26.89);Repeated falls (R29.6);Muscle weakness (generalized) (M62.81);Pain Pain - Right/Left: Right Pain - part of body: Shoulder   Activity Tolerance Patient tolerated treatment well   Patient Left in chair;with call bell/phone within reach;with family/visitor present   Nurse Communication          Time: 8264-1583 OT Time Calculation (min): 15 min  Charges: OT General Charges $OT Visit: 1 Visit OT Treatments $Self Care/Home Management : 8-22 mins  Jeni Salles,  MPH, MS, OTR/L ascom [phone] 08/10/20, 4:32 PM

## 2020-08-10 NOTE — Anesthesia Postprocedure Evaluation (Signed)
Anesthesia Post Note  Patient: Francisco [name]  Procedure(s) Performed: C3-6 POSTERIOR FUSION WITH DECOMPRESSION (N/A )  Patient location during evaluation: PACU Anesthesia Type: General Level of consciousness: awake and alert Pain management: pain level controlled Vital Signs Assessment: post-procedure vital signs reviewed and stable Respiratory status: spontaneous breathing, nonlabored ventilation, respiratory function stable and patient connected to nasal cannula oxygen Cardiovascular status: blood pressure returned to baseline and stable Postop Assessment: no apparent nausea or vomiting Anesthetic complications: no   No complications documented.   Last Vitals:  Vitals:   08/10/20 1118 08/10/20 1606  BP: (!) 118/55 138/64  Pulse: 74 69  Resp: 18 18  Temp: 36.7 C 37 C  SpO2: 96% 95%    Last Pain:  Vitals:   08/10/20 1751  TempSrc:   PainSc: 0-No pain                 Molli Barrows

## 2020-08-10 NOTE — Progress Notes (Signed)
Physical Therapy Treatment Patient Details Name: Francisco [name] MRN: [id number] DOB: 05-28-1946 Today's Date: 08/10/2020    History of Present Illness Pt is a 74 y/o M with hx of cervical myelopathy & admitted on 08/07/20 for scheduled C3-6 Posterior fusion with decompression by Dr. Izora Ribas. PMH: atypical angina, PAD, carotid stenosis, HTN, HLD, COPD, Myasthenia gravis, hypothyroidism, CKD-III, OSA requiring nocturnal PAP therapy, OA, cervical DDD, lumbosacral spinal stenosis, chronic DOE 2/2 COPD    PT Comments    Pt ready for session.  He does decline gait this session and wants to focus on exercises and HEP that he can do on is own in the room.  Seated HEP and return demo for glut sets. Ankle pumps, LAQ, marches, ab/add and writing alphabet/words with ankle and whole leg to work on strength and motor control.  Pt and wife understand exercises and he is happy for some new seated work.  He remains highly motivated to increase strength and independence.    Discussed with RN OT consult and it was ordered.   Follow Up Recommendations  CIR     Equipment Recommendations  Rolling walker with 5" wheels    Recommendations for Other Services OT consult     Precautions / Restrictions Precautions Precautions: Cervical;Fall Precaution Comments: no bending, lifting >10lbs, twisting, arching Required Braces or Orthoses: Cervical Brace Cervical Brace: At all times;Hard collar (per orders: pt can remove brace when in bed, don brace supine) Restrictions Other Position/Activity Restrictions: Pt stating today MD said he only needed to wear brace when walking/transfering and could take it off in chair.    Mobility  Bed Mobility               General bed mobility comments: in recliner before and after session  Transfers Overall transfer level: Needs assistance Equipment used: Rolling walker (2 wheeled) Transfers: Sit to/from Stand Sit to Stand: Min assist             Ambulation/Gait Ambulation/Gait assistance: Min assist;+2 safety/equipment Gait Distance (Feet): 30 Feet Assistive device: Rolling walker (2 wheeled) Gait Pattern/deviations: Step-through pattern;Decreased step length - right;Decreased step length - left Gait velocity: decreased   General Gait Details: +2 assist with +3 for recliner follow for safety.  Generally unsteady with L knee hyperextension noted.   Stairs             Wheelchair Mobility    Modified Rankin (Stroke Patients Only)       Balance Overall balance assessment: Needs assistance Sitting-balance support: Bilateral upper extremity supported;Feet supported Sitting balance-Leahy Scale: Fair Sitting balance - Comments: BUE support & CGA/min assist for dynamic sitting balance EOB   Standing balance support: Bilateral upper extremity supported;During functional activity Standing balance-Leahy Scale: Poor Standing balance comment: BUE support on RW & min/mod assist for dynamic balance                            Cognition Arousal/Alertness: Awake/alert Behavior During Therapy: WFL for tasks assessed/performed Overall Cognitive Status: Within Functional Limits for tasks assessed                                        Exercises Other Exercises Other Exercises: extensive education regarding HEP - return demo    General Comments        Pertinent Vitals/Pain Pain Assessment: Faces Faces Pain Scale: Hurts  little more Pain Location: R shoulder Pain Descriptors / Indicators: Aching;Sore Pain Intervention(s): Limited activity within patient's tolerance;Monitored during session    Home Living                      Prior Function            PT Goals (current goals can now be found in the care plan section) Progress towards PT goals: Progressing toward goals    Frequency    BID      PT Plan Current plan remains appropriate    Co-evaluation               AM-PAC PT "6 Clicks" Mobility   Outcome Measure  Help needed turning from your back to your side while in a flat bed without using bedrails?: A Lot Help needed moving from lying on your back to sitting on the side of a flat bed without using bedrails?: A Lot Help needed moving to and from a bed to a chair (including a wheelchair)?: A Lot Help needed standing up from a chair using your arms (e.g., wheelchair or bedside chair)?: A Little Help needed to walk in hospital room?: A Lot Help needed climbing 3-5 steps with a railing? : A Lot 6 Click Score: 13    End of Session Equipment Utilized During Treatment: Gait belt Activity Tolerance: Patient tolerated treatment well Patient left: with call bell/phone within reach;with family/visitor present;in chair;with chair alarm set Nurse Communication: Mobility status Pain - Right/Left: Right Pain - part of body: Shoulder     Time: 1353-1410 PT Time Calculation (min) (ACUTE ONLY): 17 min  Charges:  $Gait Training: 8-22 mins $Therapeutic Exercise: 8-22 mins                    Chesley Noon, PTA 08/10/20, 2:48 PM

## 2020-08-11 ENCOUNTER — Inpatient Hospital Stay: Payer: Medicare Other

## 2020-08-11 DIAGNOSIS — G959 Disease of spinal cord, unspecified: Secondary | ICD-10-CM

## 2020-08-11 MED ORDER — POLYETHYLENE GLYCOL 3350 17 G PO PACK: 17.0000 g | PACK | Freq: Every day | ORAL | 0 refills | Status: DC | PRN

## 2020-08-11 MED ORDER — ALUM & MAG HYDROXIDE-SIMETH 200-200-20 MG/5ML PO SUSP: 30.0000 mL | Freq: Four times a day (QID) | ORAL | 0 refills | Status: DC | PRN

## 2020-08-11 MED ORDER — MENTHOL 3 MG MT LOZG: 1.0000 | LOZENGE | OROMUCOSAL | 12 refills | Status: DC | PRN

## 2020-08-11 MED ORDER — DOCUSATE SODIUM 100 MG PO CAPS
100.0000 mg | ORAL_CAPSULE | Freq: Two times a day (BID) | ORAL | 0 refills | Status: DC
Start: 2020-08-11 — End: 2020-08-20

## 2020-08-11 MED ORDER — ONDANSETRON HCL 4 MG PO TABS
4.0000 mg | ORAL_TABLET | Freq: Four times a day (QID) | ORAL | 0 refills | Status: DC | PRN
Start: 2020-08-11 — End: 2020-08-20

## 2020-08-11 MED ORDER — FLEET ENEMA 7-19 GM/118ML RE ENEM: 1.0000 | ENEMA | Freq: Once | RECTAL | 0 refills | Status: DC | PRN

## 2020-08-11 MED ORDER — OXYCODONE HCL 10 MG PO TABS: 10.0000 mg | ORAL_TABLET | ORAL | 0 refills | Status: DC | PRN

## 2020-08-11 MED ORDER — ACETAMINOPHEN 325 MG PO TABS
650.0000 mg | ORAL_TABLET | Freq: Four times a day (QID) | ORAL | Status: DC | PRN
Start: 2020-08-11 — End: 2021-04-27

## 2020-08-11 MED ORDER — MAGNESIUM OXIDE 400 (241.3 MG) MG PO TABS
400.0000 mg | ORAL_TABLET | Freq: Every day | ORAL | Status: DC
Start: 2020-08-12 — End: 2020-08-20

## 2020-08-11 MED ORDER — ENOXAPARIN SODIUM 60 MG/0.6ML ~~LOC~~ SOLN
0.5000 mg/kg | SUBCUTANEOUS | Status: DC
Start: 2020-08-11 — End: 2020-08-20

## 2020-08-11 MED ORDER — CELECOXIB 100 MG PO CAPS
100.0000 mg | ORAL_CAPSULE | Freq: Two times a day (BID) | ORAL | Status: DC
Start: 2020-08-11 — End: 2020-08-20

## 2020-08-11 MED ORDER — BISACODYL 5 MG PO TBEC: 5.0000 mg | DELAYED_RELEASE_TABLET | Freq: Every day | ORAL | 0 refills | Status: DC | PRN

## 2020-08-11 MED ORDER — SENNA 8.6 MG PO TABS
2.0000 | ORAL_TABLET | Freq: Two times a day (BID) | ORAL | 0 refills | Status: DC
Start: 2020-08-11 — End: 2020-08-20

## 2020-08-11 MED ORDER — OXYCODONE HCL 5 MG PO TABS: 5.0000 mg | ORAL_TABLET | ORAL | 0 refills | Status: DC | PRN

## 2020-08-11 NOTE — Progress Notes (Signed)
Attending Progress Note  History: Francisco [name] is here for cervical myelopathy.  POD#4 from C3-6 PCDF  Subjective: Francisco [name] appears stable this morning and is sitting in chair. He reports no pain but does have known weakness and numbness. Worked well with PT yesterday    Physical Exam: Vitals:   08/11/20 0403 08/11/20 0717  BP: 138/68 109/79  Pulse: 77 68  Resp: 15 20  Temp: 98 F (36.7 C) 98.3 F (36.8 C)  SpO2: 98% 99%    AA Ox3 Strength:5/5 throughout BUE with the exception of 4+ out of 5 in deltoid and grip 5/5 BLE in all motor groups Sensation decreased in bilateral hands, at baseline  Incision c/d/i   Data:  Other tests/results: none to review  Assessment/Plan:  Francisco [name] is doing well after C3-6 PSFD.    - mobilize, working with PT/OT - pain control adequate on oral analgesics - DVT prophylaxis - enoxaparin - drain removed 11/29 - tolerating regular diet - in Aspen collar - medically stable for discharge, rehab pending   Deetta Perla, MD Department of Neurosurgery

## 2020-08-11 NOTE — Progress Notes (Signed)
PT Cancellation Note  Patient Details Name: Francisco [name] MRN: [id number] DOB: 14-Mar-1946   Cancelled Treatment:    Reason Eval/Treat Not Completed: Pain limiting ability to participate   Attempted session this am.  Pt in chair with c/o significant burning in neck and R shoulder.  Pt visibly uncomfortable.  Stated RN was aware and had messaged MD.  Session deferred this am.   Chesley Noon 08/11/2020, 1:08 PM

## 2020-08-11 NOTE — Discharge Instructions (Signed)
Your surgeon has performed an operation on your cervical spine (neck) to relieve pressure on the spinal cord and/or nerves.  The following are instructions to help in your recovery once you have been discharged from the hospital. Even if you feel well, it is important that you follow these activity guidelines. If you do not let your neck heal properly from the surgery, you can increase the chance of return of your symptoms and other complications.  * Do not take anti-inflammatory medications for 3 months after surgery (naproxen [Aleve], ibuprofen [Advil, Motrin], etc.). These medications can prevent your bones from healing properly.  Activity    No bending, lifting, or twisting ("BLT"). Avoid lifting objects heavier than 10 pounds (gallon milk jug).  Where possible, avoid household activities that involve lifting, bending, reaching, pushing, or pulling such as laundry, vacuuming, grocery shopping, and childcare. Try to arrange for help from friends and family for these activities while your back heals.  Increase physical activity slowly as tolerated.  Taking short walks is encouraged, but avoid strenuous exercise. Do not jog, run, bicycle, lift weights, or participate in any other exercises unless specifically allowed by your doctor.  Talk to your doctor before resuming sexual activity.  You should not drive until cleared by your doctor.  Until released by your doctor, you should not return to work or school.  You should rest at home and let your body heal.   You may shower three days after your surgery.  After showering, lightly dab your incision dry. Do not take a tub bath or go swimming until approved by your doctor at your follow-up appointment.  If your doctor ordered a cervical collar (neck brace) for you, you should wear it whenever you are out of bed. You may remove it when lying down or sleeping, but you should wear it at all other times. Not all neck surgeries require a cervical  collar.  If you smoke, we strongly recommend that you quit.  Smoking has been proven to interfere with normal bone healing and will dramatically reduce the success rate of your surgery. Please contact [facility] (800-QUIT-NOW) and use the resources at www.[facility].com for assistance in stopping smoking.  Surgical Incision   If you have a dressing on your incision, you may remove it two days after your surgery. Keep your incision area clean and dry.  If you have staples or stitches on your incision, you should have a follow up scheduled for removal. If you do not have staples or stitches, you will have steri-strips (small pieces of surgical tape) or Dermabond glue. The steri-strips/glue should begin to peel away within about a week (it is fine if the steri-strips fall off before then). If the strips are still in place one week after your surgery, you may gently remove them.  Diet           You may return to your usual diet. However, you may experience discomfort when swallowing in the first month after your surgery. This is normal. You may find that softer foods are more comfortable for you to swallow. Be sure to stay hydrated.  When to Contact us  You may experience pain in your neck and/or pain between your shoulder blades. This is normal and should improve in the next few weeks with the help of pain medication, muscle relaxers, and rest. Some patients report that a warm compress on the back of the neck or between the shoulder blades helps.  However, should you experience any of  the following, contact us immediately: . New numbness or weakness . Pain that is progressively getting worse, and is not relieved by your pain medication, muscle relaxers, rest, and warm compresses . Bleeding, redness, swelling, pain, or drainage from surgical incision . Chills or flu-like symptoms . Fever greater than 101.0 F (38.3 C) . Inability to eat, drink fluids, or take medications . Problems with bowel or  bladder functions . Difficulty breathing or shortness of breath . Warmth, tenderness, or swelling in your calf Contact Information . During office hours (Monday-Friday 9 am to 5 pm), please call your physician at [phone] and ask for Berdine Addison . After hours and weekends, please call [phone] and an answering service will put you in touch with either Dr. Lacinda Axon or Dr. Izora Ribas.  . For a life-threatening emergency, call 911

## 2020-08-11 NOTE — Care Management Important Message (Signed)
Important Message  Patient Details  Name: Francisco [name] MRN: [id number] Date of Birth: 01/22/1946   Medicare Important Message Given:  Yes     Juliann Pulse A Ayanah Snader 08/11/2020, 11:04 AM

## 2020-08-11 NOTE — Progress Notes (Signed)
PT Cancellation Note  Patient Details Name: Francisco [name] MRN: [id number] DOB: 1945/12/18   Cancelled Treatment:    Reason Eval/Treat Not Completed: Other (comment)   Attempted session this pm.  Medication received and pain controlled.  Pt eating, awaiting transport to x-ray then transfer to CIR.  Discharge orders in.  Will defer PM session and continue tomorrow if transfer not complete.   Chesley Noon 08/11/2020, 1:29 PM

## 2020-08-11 NOTE — H&P (Addendum)
Physical Medicine and Rehabilitation Admission H&P    CC: Cervical myelopathy with functional deficits.   HPI: Francisco [name] is a 74 year old male with history of HTN, MG, COPD, CKD-3,  SOB/OSA--non compliant with CPAP, prostate cancer s/p radical prostatectomy, CAS- plans for stent in the future, lumbar and cervical stenosis s/p ACDF C5-C7 who started having acute on chronic weakness with spasticity and neurogenic bladder 07/14/20 and MRI reveled moderate to severe spinal stenosis C3-C4 with hyperintense signal due to compressive changes. He was evaluated by Dr. Cari Caraway and admitted to [facility] on 1126/21 for C3-C5 decompressive laminectomy with posterior lateral arthrodesis C3-C6. Post op to wear collar when OOB. Drain removed 11/29 and on lovenox for DVR prophylaxis. Therapy ongoing and patient limited by  BUE weakness, impaired standing balance, LLE weakness and unsteady gait. CIR recommended due to functional decline.    Review of Systems  Constitutional: Negative.   HENT: Negative.   Eyes: Negative.   Respiratory: Negative.   Cardiovascular: Negative.   Gastrointestinal: Negative.   Genitourinary: Negative.   Musculoskeletal: Positive for neck pain.  Neurological: Positive for tingling, focal weakness and weakness.  All other systems reviewed and are negative.     Past Medical History:  Diagnosis Date  . Arthritis    lower left hip  . Atypical angina ([place])   . Bilateral hand numbness    from back surgery  . Bronchitis, chronic ([place])   . Cancer [facility])    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; Merkel cell cancer, and Basal cell cancer (twice; 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  . Carotid stenosis   . CKD (chronic kidney disease) stage 3, GFR 30-59 ml/min (HCC)   . 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 DDD (degenerative disc disease), cervical   . Hypercholesterolemia   . Hypertension   . Hypothyroidism    pt takes Levothyroxine daily  . Lumbosacral spinal  stenosis   . Myasthenia gravis, adult form ([place])   . PAD (peripheral artery disease) ([place])   . Shortness of breath    Lung MD- Dr Darlin Coco  . Sleep apnea    do not use CPAP every night    Past Surgical History:  Procedure Laterality Date  . ANTERIOR CERVICAL DECOMP/DISCECTOMY FUSION  07/18/2011   Procedure: ANTERIOR CERVICAL DECOMPRESSION/DISCECTOMY FUSION 2 LEVELS;  Surgeon: Cooper Render Pool;  Location: [place] [facility];  Service: Neurosurgery;  Laterality: N/A;  cervical five-six, cervical six-seven anterior cervical discectomy and fusion  . BACK SURGERY     in [place]     01/2020 Right, 04/2020 Left  . CARDIAC CATHETERIZATION     2005 at [facility], no stents  . CATARACT EXTRACTION W/PHACO Left 01/06/2020   Procedure: CATARACT EXTRACTION PHACO AND INTRAOCULAR LENS PLACEMENT (IOC) ISTENT INJ LEFT 3.81  00:33.3;  Surgeon: Eulogio Bear, MD;  Location: [place];  Service: Ophthalmology;  Laterality: Left;  . CATARACT EXTRACTION W/PHACO Right 02/03/2020   Procedure: CATARACT EXTRACTION PHACO AND INTRAOCULAR LENS PLACEMENT ([place]) RIGHT ISTENT INJ;  Surgeon: Eulogio Bear, MD;  Location: [place];  Service: Ophthalmology;  Laterality: Right;  4.29 0:35.6  . COLONOSCOPY    . HERNIA REPAIR Left    inguinal hernia repair in 1985  . LUMBAR LAMINECTOMY/DECOMPRESSION MICRODISCECTOMY Left 02/24/2014   Procedure: LUMBAR LAMINECTOMY/DECOMPRESSION MICRODISCECTOMY LUMBAR THREE-FOUR, FOUR-FIVE, LEFT FIVE-SACRAL ONE ;  Surgeon: Charlie Pitter, MD;  Location: [facility];  Service: Neurosurgery;  Laterality: Left;  LUMBAR LAMINECTOMY/DECOMPRESSION MICRODISCECTOMY LUMBAR THREE-FOUR, FOUR-FIVE, LEFT FIVE-SACRAL ONE   . POSTERIOR CERVICAL FUSION/FORAMINOTOMY N/A 08/07/2020   Procedure: C3-6 POSTERIOR FUSION WITH DECOMPRESSION;  Surgeon: Meade Maw, MD;  Location: [facility] ORS;  Service: Neurosurgery;  Laterality: N/A;  . PROSTATECTOMY  8/14    [facility] Dr Mare Ferrari     Family History  Problem Relation Age of Onset  . Hypertension Mother   . Stroke Mother   . Stroke Father     Social History:  reports that he quit smoking about 18 years ago. His smoking use included cigarettes. He has a 20.00 pack-year smoking history. He has never used smokeless tobacco. He reports current alcohol use. He reports that he does not use drugs.   Allergies  Allergen Reactions  . Azithromycin Other (See Comments)    Avoid due to myasthenia gravis  . Codeine Nausea And Vomiting    Medications Prior to Admission  Medication Sig Dispense Refill  . aspirin 81 MG tablet Take 81 mg by mouth every other day.     . azaTHIOprine (IMURAN) 50 MG tablet Take 150 mg by mouth daily.     . furosemide (LASIX) 20 MG tablet Take 20 mg by mouth 2 (two) times daily as needed (swelling).     . gabapentin (NEURONTIN) 300 MG capsule Take 300 mg by mouth 2 (two) times daily.    . hydrochlorothiazide (HYDRODIURIL) 25 MG tablet Take 12.5 mg by mouth daily.   3  . HYDROcodone-acetaminophen (NORCO) 10-325 MG tablet Take 1 tablet by mouth every 6 (six) hours as needed (pain).     Marland Kitchen levothyroxine (SYNTHROID, LEVOTHROID) 88 MCG tablet Take 88 mcg by mouth daily.      Marland Kitchen losartan (COZAAR) 50 MG tablet Take 50 mg by mouth 2 (two) times daily.     . Magnesium 500 MG TABS Take 500 mg by mouth daily.     Marland Kitchen oxybutynin (DITROPAN) 5 MG tablet Take 5 mg by mouth 3 (three) times daily.    . pantoprazole (PROTONIX) 40 MG tablet Take 40 mg by mouth 2 (two) times daily before a meal.     . predniSONE (DELTASONE) 20 MG tablet Take 20 mg by mouth every other day.     . pyridostigmine (MESTINON) 60 MG tablet Take 60 mg by mouth in the morning and at bedtime.     Marland Kitchen rOPINIRole (REQUIP) 4 MG tablet Take 4 mg by mouth at bedtime.     . vitamin B-12 (CYANOCOBALAMIN) 1000 MCG tablet Take 1,000 mcg by mouth daily.      Drug Regimen Review  Drug regimen was reviewed and remains appropriate  with no significant issues identified  Home: Home Living Family/patient expects to be discharged to:: Private residence Living Arrangements: Spouse/significant other Available Help at Discharge: Family, Available PRN/intermittently Type of Home: House Home Access: Stairs to enter Technical brewer of Steps: 3 Entrance Stairs-Rails: Left Home Layout: One level Bathroom Shower/Tub: Chiropodist: Handicapped height Bathroom Accessibility: Yes Home Equipment: Environmental consultant - 2 wheels, Cane - single point, Wheelchair - manual, Grab bars - tub/shower, Grab bars - toilet, Hand held shower head, Adaptive equipment Adaptive Equipment: Sock aid, Long-handled shoe horn, Long-handled sponge Additional Comments: had a reacher but it's broken  Lives With: Spouse   Functional History: Prior Function Level of Independence: Needs assistance Gait / Transfers Assistance Needed: 6 weeks ago pt was independent (going snow tubing) then began to need RW for mobility, 3  weeks ago pt required w/c for mobility ADL's / Homemaking Assistance Needed: spouse has recently been assisting with tub transfers and LB dressing; couple primarily gets take out/don't cook much, pt indep with med mgt, spouse has been driving since pt starting having more difficulty; pt wearing incontinence briefs recently 2/2 issues and spouse assisting particularly with overnight toileting to change briefs, have fabric chucks at home Comments: 6 weeks ago pt was independent (going snow tubing) then began to need RW for mobility, 3 weeks ago pt required w/c for mobility  Functional Status:  Mobility: Bed Mobility Overal bed mobility: Needs Assistance Bed Mobility: Rolling, Sidelying to Sit, Sit to Supine Rolling: Min assist (bed rails) Sidelying to sit: Min assist, HOB elevated (bed rails) Supine to sit: Mod assist, Min assist Sit to supine: Mod assist, HOB elevated Sit to sidelying: Mod assist General bed mobility  comments: in recliner before and after session Transfers Overall transfer level: Needs assistance Equipment used: Rolling walker (2 wheeled) Transfers: Sit to/from Stand Sit to Stand: Min assist Stand pivot transfers: Max assist, +2 physical assistance (max assist & 2nd person present for safety) General transfer comment: Cues for hand placement and to scoot towards edge of recliner prior to attempt with improved performance noted on 2nd attempt Ambulation/Gait Ambulation/Gait assistance: Min assist, +2 safety/equipment Gait Distance (Feet): 30 Feet Assistive device: Rolling walker (2 wheeled) Gait Pattern/deviations: Step-through pattern, Decreased step length - right, Decreased step length - left General Gait Details: +2 assist with +3 for recliner follow for safety.  Generally unsteady with L knee hyperextension noted. Gait velocity: decreased    ADL: ADL Overall ADL's : Needs assistance/impaired Eating/Feeding: With caregiver independent assisting Eating/Feeding Details (indicate cue type and reason): spouse feeding pt upon OT's arrival Grooming: Sitting, Minimal assistance Upper Body Bathing: Sitting, Minimal assistance, Moderate assistance Lower Body Bathing: Sitting/lateral leans, Moderate assistance Upper Body Dressing : Sitting, Minimal assistance, Moderate assistance Lower Body Dressing: Sitting/lateral leans, Moderate assistance Toilet Transfer: RW, Ambulation, BSC, Cueing for sequencing, Cueing for safety, Minimal assistance Toileting- Clothing Manipulation and Hygiene: Maximal assistance, Sit to/from stand Toileting - Clothing Manipulation Details (indicate cue type and reason): pt typically will use grab bar at home in front of him to pull up; CGA-Min A to stand with RW and spouse performed pericare Functional mobility during ADLs: Minimal assistance, Cueing for sequencing, Cueing for safety, Rolling walker  Cognition: Cognition Overall Cognitive Status: Within  Functional Limits for tasks assessed Orientation Level: Oriented X4 Cognition Arousal/Alertness: Awake/alert Behavior During Therapy: WFL for tasks assessed/performed Overall Cognitive Status: Within Functional Limits for tasks assessed General Comments: cues for redirecting/safety at times  Physical Exam: Blood pressure (!) 142/64, pulse 76, temperature 97.7 F (36.5 C), temperature source Oral, resp. rate 16, height 5\' 10"  (1.778 m), weight 108 kg, SpO2 100 %. Physical Exam Vitals reviewed.  Constitutional:      General: He is not in acute distress.    Appearance: He is obese.  HENT:     Head: Normocephalic and atraumatic.     Right Ear: External ear normal.     Left Ear: External ear normal.     Nose: Nose normal.  Eyes:     General:        Right eye: No discharge.        Left eye: No discharge.     Extraocular Movements: Extraocular movements intact.  Neck:     Comments: C-collar in place Cardiovascular:     Rate and Rhythm: Normal rate  and regular rhythm.  Pulmonary:     Effort: Pulmonary effort is normal. No respiratory distress.     Breath sounds: No stridor.  Abdominal:     General: Abdomen is flat. Bowel sounds are normal. There is no distension.  Musculoskeletal:     Comments: No edema or tenderness in extremities   Skin:    General: Skin is warm and dry.  Neurological:     Mental Status: He is alert.     Comments: Alert and oriented Bilateral upper extremities: 5/5 proximal distal Right lower extremity: 5/5 proximal distal Left lower extremity: Hip flexion 4/5, knee extension 4+/5, dorsiflexion 3/5 Sensation diminished to light touch in bilateral hands and left foot  Psychiatric:        Mood and Affect: Mood normal.        Behavior: Behavior normal.        Thought Content: Thought content normal.        Judgment: Judgment normal.     No results found for this or any previous visit (from the past 48 hour(s)). DG Cervical Spine 2 or 3 views  Result  Date: 08/11/2020 CLINICAL DATA:  Cervical posterior fusion C3 through C6. EXAM: CERVICAL SPINE - 2-3 VIEW COMPARISON:  08/07/2020 FINDINGS: ACDF C5 through C7 previously performed. Recent posterior fusion with lateral mass screws and rods extending from C3 through C6. Hardware in satisfactory position. Posterior skin staples noted. No acute skeletal abnormality. IMPRESSION: Bilateral screw C3 through C6 without complication and rod fusion From previously placed anterior fusion C5 through C7 with hardware. Electronically Signed   By: Franchot Gallo M.D.   On: 08/11/2020 16:29   Medical Problem List and Plan: 1.  Impaired mobility and ADLs secondary to cervical myelopathy  -patient may not shower  -ELOS/Goals: 10-14 days/supervision/mod I  Admit to CIR 2.  Antithrombotics: -DVT/anticoagulation:  Pharmaceutical: Lovenox  -antiplatelet therapy: N/A 3. Pain Management: Now on tylenol prn as well as  Celebrex bid DC'd.  Continue gabapentin 300 mg tid, adjust as necessary  Oxycodone prn.   Monitor with increased exertion 4. Mood: LCSW to follow for evaluation and support.   -antipsychotic agents: N/A 5. Neuropsych: This patient is capable of making decisions on his own behalf. 6. Skin/Wound Care: Monitor wound for healing.  7. Fluids/Electrolytes/Nutrition: Monitor I/Os.   CMP ordered for tomorrow a.m. 8. COPD/OSA: Uses oxygen 2 L at bedime per records.  9. CKD stage 3: Baseline SCr around 1.39-1.5 in the past couple of months. 1.39 on 11/18.   CMP ordered for tomorrow a.m. 10. MG: Has been stable on Imuran 150 mg/day and Mestinon 60 mg bid.    Regulate therapies if necessary 11. Anemia of chronic disease: Pre-op H/H- 10.4/32.7.   CBC ordered for tomorrow a.m. 12.  Hypertension:   Cozaar  Monitor with increased mobility  13.  Leucocytosis: WBC 10.7 on 11/18.   CBC ordered for tomorrow a.m. 14. RLS: managed by ropinirole at bedtime.   Bary Leriche, PA-C 08/12/2020   I have personally  performed a face to face diagnostic evaluation, including, but not limited to relevant history and physical exam findings, of this patient and developed relevant assessment and plan.  Additionally, I have reviewed and concur with the physician assistant's documentation above.  Delice Lesch, MD, ABPMR

## 2020-08-11 NOTE — Discharge Summary (Signed)
Physician Discharge Summary  Patient ID: Francisco [name] MRN: [id number] DOB/AGE: 75-Feb-1947 75 y.o.  Admit date: 08/07/2020 Discharge date: 08/11/2020  Admission Diagnoses:  Discharge Diagnoses:  Active Problems:   Cervical myelopathy [facility])   Discharged Condition: stable  Hospital Course: Uneventful. Francisco [name] is POD 4 from C3-6 PCDF. He has been evaluated by PT and found to be appropriate for discharge to Acute Rehab. His drain was removed yesterday (08/10/2020).   Consults: None  Significant Diagnostic Studies: radiology: post operative Xrays with appropriate alignment  Treatments: PT/OT  Discharge Exam: Blood pressure (!) 116/52, pulse 76, temperature 98.2 F (36.8 C), temperature source Oral, resp. rate 19, height 5\' 10"  (1.778 m), weight 108 kg, SpO2 98 %. AA Ox3  Strength:5/5 throughout BUE with the exception of 4+ out of 5 in deltoid and grip  5/5 BLE in all motor groups  Sensation decreased in bilateral hands, at baseline   Disposition: Discharge disposition: [place] Not Defined       Discharge Instructions    Call MD for:   Complete by: As directed    Call MD for:  difficulty breathing, headache or visual disturbances   Complete by: As directed    Call MD for:  extreme fatigue   Complete by: As directed    Call MD for:  hives   Complete by: As directed    Call MD for:  persistant dizziness or light-headedness   Complete by: As directed    Call MD for:  persistant nausea and vomiting   Complete by: As directed    Call MD for:  redness, tenderness, or signs of infection (pain, swelling, redness, odor or green/yellow discharge around incision site)   Complete by: As directed    Call MD for:  severe uncontrolled pain   Complete by: As directed    Call MD for:  temperature >100.4   Complete by: As directed    Diet general   Complete by: As directed    Increase activity slowly   Complete by: As directed    No  dressing needed   Complete by: As directed    May cleanse incision daily with warm soapy water     Allergies as of 08/11/2020      Reactions   Azithromycin Other (See Comments)   Avoid due to myasthenia gravis   Codeine Nausea And Vomiting      Medication List    STOP taking these medications   aspirin 81 MG tablet   HYDROcodone-acetaminophen 10-325 MG tablet Commonly known as: NORCO     TAKE these medications   acetaminophen 325 MG tablet Commonly known as: TYLENOL Take 2 tablets (650 mg total) by mouth every 6 (six) hours as needed for mild pain.   alum & mag hydroxide-simeth 200-200-20 MG/5ML suspension Commonly known as: MAALOX/MYLANTA Take 30 mLs by mouth every 6 (six) hours as needed for indigestion.   azaTHIOprine 50 MG tablet Commonly known as: IMURAN Take 150 mg by mouth daily.   bisacodyl 5 MG EC tablet Commonly known as: DULCOLAX Take 1 tablet (5 mg total) by mouth daily as needed for moderate constipation.   celecoxib 100 MG capsule Commonly known as: CELEBREX Take 1 capsule (100 mg total) by mouth every 12 (twelve) hours.   docusate sodium 100 MG capsule Commonly known as: COLACE Take 1 capsule (100 mg total) by mouth 2 (two) times daily.   enoxaparin 60 MG/0.6ML injection Commonly known as: LOVENOX Inject 0.55 mLs (55  mg total) into the skin daily.   furosemide 20 MG tablet Commonly known as: LASIX Take 20 mg by mouth 2 (two) times daily as needed (swelling).   gabapentin 300 MG capsule Commonly known as: NEURONTIN Take 300 mg by mouth 2 (two) times daily.   hydrochlorothiazide 25 MG tablet Commonly known as: HYDRODIURIL Take 12.5 mg by mouth daily.   levothyroxine 88 MCG tablet Commonly known as: SYNTHROID Take 88 mcg by mouth daily.   losartan 50 MG tablet Commonly known as: COZAAR Take 50 mg by mouth 2 (two) times daily.   Magnesium 500 MG Tabs Take 500 mg by mouth daily.   magnesium oxide 400 (241.3 Mg) MG tablet Commonly  known as: MAG-OX Take 1 tablet (400 mg total) by mouth daily. Start taking on: August 12, 2020   menthol-cetylpyridinium 3 MG lozenge Commonly known as: CEPACOL Take 1 lozenge (3 mg total) by mouth as needed for sore throat.   ondansetron 4 MG tablet Commonly known as: ZOFRAN Take 1 tablet (4 mg total) by mouth every 6 (six) hours as needed for nausea or vomiting.   oxybutynin 5 MG tablet Commonly known as: DITROPAN Take 5 mg by mouth 3 (three) times daily.   oxyCODONE 5 MG immediate release tablet Commonly known as: Oxy IR/ROXICODONE Take 1 tablet (5 mg total) by mouth every 3 (three) hours as needed for moderate pain ((score 4 to 6)).   Oxycodone HCl 10 MG Tabs Take 1 tablet (10 mg total) by mouth every 3 (three) hours as needed for severe pain ((score 7 to 10)).   pantoprazole 40 MG tablet Commonly known as: PROTONIX Take 40 mg by mouth 2 (two) times daily before a meal.   polyethylene glycol 17 g packet Commonly known as: MIRALAX / GLYCOLAX Take 17 g by mouth daily as needed for mild constipation.   predniSONE 20 MG tablet Commonly known as: DELTASONE Take 20 mg by mouth every other day.   pyridostigmine 60 MG tablet Commonly known as: MESTINON Take 60 mg by mouth in the morning and at bedtime.   rOPINIRole 4 MG tablet Commonly known as: REQUIP Take 4 mg by mouth at bedtime.   senna 8.6 MG Tabs tablet Commonly known as: SENOKOT Take 2 tablets (17.2 mg total) by mouth 2 (two) times daily.   sodium phosphate 7-19 GM/118ML Enem Place 133 mLs (1 enema total) rectally once as needed for severe constipation.   vitamin B-12 1000 MCG tablet Commonly known as: CYANOCOBALAMIN Take 1,000 mcg by mouth daily.            Discharge Care Instructions  (From admission, onward)         Start     Ordered   08/11/20 0000  No dressing needed       Comments: May cleanse incision daily with warm soapy water   08/11/20 1239          Francisco [name] is appropriate  for discharge to [facility] at this time.  Signed: Lonell Face 08/11/2020, 12:41 PM

## 2020-08-12 ENCOUNTER — Inpatient Hospital Stay (HOSPITAL_COMMUNITY): Payer: Medicare Other

## 2020-08-12 ENCOUNTER — Inpatient Hospital Stay (HOSPITAL_COMMUNITY)
Admission: RE | Admit: 2020-08-12 | Discharge: 2020-08-20 | DRG: 560 | Disposition: A | Discharge status: Home / Self Care | Payer: Medicare Other | Source: Other Acute Inpatient Hospital | Attending: Physical Medicine and Rehabilitation | Admitting: Physical Medicine and Rehabilitation

## 2020-08-12 ENCOUNTER — Encounter (HOSPITAL_COMMUNITY): Payer: Self-pay | Admitting: Physical Medicine and Rehabilitation

## 2020-08-12 ENCOUNTER — Other Ambulatory Visit: Payer: Self-pay

## 2020-08-12 DIAGNOSIS — J449 Chronic obstructive pulmonary disease, unspecified: Secondary | ICD-10-CM | POA: Diagnosis present

## 2020-08-12 DIAGNOSIS — E039 Hypothyroidism, unspecified: Secondary | ICD-10-CM | POA: Diagnosis present

## 2020-08-12 DIAGNOSIS — I129 Hypertensive chronic kidney disease with stage 1 through stage 4 chronic kidney disease, or unspecified chronic kidney disease: Secondary | ICD-10-CM | POA: Diagnosis present

## 2020-08-12 DIAGNOSIS — I739 Peripheral vascular disease, unspecified: Secondary | ICD-10-CM | POA: Diagnosis present

## 2020-08-12 DIAGNOSIS — N183 Chronic kidney disease, stage 3 unspecified: Secondary | ICD-10-CM

## 2020-08-12 DIAGNOSIS — D62 Acute posthemorrhagic anemia: Secondary | ICD-10-CM | POA: Diagnosis present

## 2020-08-12 DIAGNOSIS — E7800 Pure hypercholesterolemia, unspecified: Secondary | ICD-10-CM | POA: Diagnosis present

## 2020-08-12 DIAGNOSIS — M792 Neuralgia and neuritis, unspecified: Secondary | ICD-10-CM

## 2020-08-12 DIAGNOSIS — G4700 Insomnia, unspecified: Secondary | ICD-10-CM | POA: Diagnosis not present

## 2020-08-12 DIAGNOSIS — Z981 Arthrodesis status: Secondary | ICD-10-CM

## 2020-08-12 DIAGNOSIS — K5900 Constipation, unspecified: Secondary | ICD-10-CM | POA: Diagnosis present

## 2020-08-12 DIAGNOSIS — Z8546 Personal history of malignant neoplasm of prostate: Secondary | ICD-10-CM

## 2020-08-12 DIAGNOSIS — Z85828 Personal history of other malignant neoplasm of skin: Secondary | ICD-10-CM

## 2020-08-12 DIAGNOSIS — D631 Anemia in chronic kidney disease: Secondary | ICD-10-CM | POA: Diagnosis present

## 2020-08-12 DIAGNOSIS — Z79890 Hormone replacement therapy (postmenopausal): Secondary | ICD-10-CM

## 2020-08-12 DIAGNOSIS — Z7982 Long term (current) use of aspirin: Secondary | ICD-10-CM | POA: Diagnosis not present

## 2020-08-12 DIAGNOSIS — Z87891 Personal history of nicotine dependence: Secondary | ICD-10-CM | POA: Diagnosis not present

## 2020-08-12 DIAGNOSIS — Z9119 Patient's noncompliance with other medical treatment and regimen: Secondary | ICD-10-CM | POA: Diagnosis not present

## 2020-08-12 DIAGNOSIS — G2581 Restless legs syndrome: Secondary | ICD-10-CM | POA: Diagnosis present

## 2020-08-12 DIAGNOSIS — N1831 Chronic kidney disease, stage 3a: Secondary | ICD-10-CM | POA: Diagnosis not present

## 2020-08-12 DIAGNOSIS — D72829 Elevated white blood cell count, unspecified: Secondary | ICD-10-CM | POA: Diagnosis not present

## 2020-08-12 DIAGNOSIS — Z79899 Other long term (current) drug therapy: Secondary | ICD-10-CM | POA: Diagnosis not present

## 2020-08-12 DIAGNOSIS — Z23 Encounter for immunization: Secondary | ICD-10-CM | POA: Diagnosis present

## 2020-08-12 DIAGNOSIS — D638 Anemia in other chronic diseases classified elsewhere: Secondary | ICD-10-CM | POA: Diagnosis present

## 2020-08-12 DIAGNOSIS — G4733 Obstructive sleep apnea (adult) (pediatric): Secondary | ICD-10-CM | POA: Diagnosis present

## 2020-08-12 DIAGNOSIS — G7000 Myasthenia gravis without (acute) exacerbation: Secondary | ICD-10-CM

## 2020-08-12 DIAGNOSIS — G8918 Other acute postprocedural pain: Secondary | ICD-10-CM

## 2020-08-12 DIAGNOSIS — D539 Nutritional anemia, unspecified: Secondary | ICD-10-CM | POA: Diagnosis present

## 2020-08-12 DIAGNOSIS — I10 Essential (primary) hypertension: Secondary | ICD-10-CM | POA: Diagnosis not present

## 2020-08-12 DIAGNOSIS — Z7952 Long term (current) use of systemic steroids: Secondary | ICD-10-CM

## 2020-08-12 DIAGNOSIS — D649 Anemia, unspecified: Secondary | ICD-10-CM | POA: Diagnosis present

## 2020-08-12 DIAGNOSIS — G959 Disease of spinal cord, unspecified: Secondary | ICD-10-CM

## 2020-08-12 DIAGNOSIS — Z4789 Encounter for other orthopedic aftercare: Principal | ICD-10-CM

## 2020-08-12 DIAGNOSIS — Z85821 Personal history of Merkel cell carcinoma: Secondary | ICD-10-CM | POA: Diagnosis not present

## 2020-08-12 DIAGNOSIS — N319 Neuromuscular dysfunction of bladder, unspecified: Secondary | ICD-10-CM | POA: Diagnosis present

## 2020-08-12 LAB — IRON AND TIBC
Iron: 42 ug/dL — ABNORMAL LOW (ref 45–182)
Saturation Ratios: 15 % — ABNORMAL LOW (ref 17.9–39.5)
TIBC: 280 ug/dL (ref 250–450)
UIBC: 238 ug/dL

## 2020-08-12 LAB — BASIC METABOLIC PANEL
Anion gap: 10 (ref 5–15)
BUN: 23 mg/dL (ref 8–23)
CO2: 26 mmol/L (ref 22–32)
Calcium: 9.2 mg/dL (ref 8.9–10.3)
Chloride: 105 mmol/L (ref 98–111)
Creatinine, Ser: 1.19 mg/dL (ref 0.61–1.24)
GFR, Estimated: >60 mL/min (ref >60)
Glucose, Bld: 109 mg/dL — ABNORMAL HIGH (ref 70–99)
Potassium: 4.3 mmol/L (ref 3.5–5.1)
Sodium: 141 mmol/L (ref 135–145)

## 2020-08-12 LAB — FERRITIN: Ferritin: 134 ng/mL (ref 24–336)

## 2020-08-12 LAB — VITAMIN B12: Vitamin B-12: 449 pg/mL (ref 180–914)

## 2020-08-12 MED ORDER — MENTHOL 3 MG MT LOZG: 1.0000 | LOZENGE | OROMUCOSAL | Status: DC | PRN

## 2020-08-12 MED ORDER — BISACODYL 10 MG RE SUPP: 10.0000 mg | Freq: Every day | RECTAL | Status: DC | PRN

## 2020-08-12 MED ORDER — FUROSEMIDE 20 MG PO TABS: 20.0000 mg | ORAL_TABLET | Freq: Two times a day (BID) | ORAL | Status: DC | PRN

## 2020-08-12 MED ORDER — PHENOL 1.4 % MT LIQD: 1.0000 | OROMUCOSAL | Status: DC | PRN

## 2020-08-12 MED ORDER — PREDNISONE 20 MG PO TABS
20.0000 mg | ORAL_TABLET | ORAL | Status: DC
  Administered 2020-08-12 – 2020-08-20 (×5): 20 mg via ORAL
  Filled 2020-08-12 (×5): qty 1

## 2020-08-12 MED ORDER — PYRIDOSTIGMINE BROMIDE 60 MG PO TABS
60.0000 mg | ORAL_TABLET | Freq: Two times a day (BID) | ORAL | Status: DC
  Administered 2020-08-12 – 2020-08-20 (×17): 60 mg via ORAL
  Filled 2020-08-12 (×18): qty 1

## 2020-08-12 MED ORDER — PROCHLORPERAZINE 25 MG RE SUPP: 12.5000 mg | Freq: Four times a day (QID) | RECTAL | Status: DC | PRN

## 2020-08-12 MED ORDER — OXYCODONE HCL 5 MG PO TABS
5.0000 mg | ORAL_TABLET | ORAL | Status: DC | PRN
  Administered 2020-08-12: 5 mg via ORAL
  Filled 2020-08-12: qty 1

## 2020-08-12 MED ORDER — HYDROCODONE-ACETAMINOPHEN 5-325 MG PO TABS
1.0000 | ORAL_TABLET | ORAL | Status: DC | PRN
  Administered 2020-08-13 (×2): 2 via ORAL
  Administered 2020-08-14 – 2020-08-16 (×4): 1 via ORAL
  Administered 2020-08-16 – 2020-08-17 (×2): 2 via ORAL
  Administered 2020-08-17: 1 via ORAL
  Filled 2020-08-12: qty 1
  Filled 2020-08-12 (×3): qty 2
  Filled 2020-08-12 (×3): qty 1
  Filled 2020-08-12: qty 2
  Filled 2020-08-12: qty 1

## 2020-08-12 MED ORDER — LIDOCAINE HCL URETHRAL/MUCOSAL 2 % EX GEL: CUTANEOUS | Status: DC | PRN

## 2020-08-12 MED ORDER — PROCHLORPERAZINE EDISYLATE 10 MG/2ML IJ SOLN: 5.0000 mg | Freq: Four times a day (QID) | INTRAMUSCULAR | Status: DC | PRN

## 2020-08-12 MED ORDER — GABAPENTIN 300 MG PO CAPS
300.0000 mg | ORAL_CAPSULE | Freq: Three times a day (TID) | ORAL | Status: DC
  Administered 2020-08-12 – 2020-08-19 (×22): 300 mg via ORAL
  Filled 2020-08-12 (×23): qty 1

## 2020-08-12 MED ORDER — GUAIFENESIN-DM 100-10 MG/5ML PO SYRP: 5.0000 mL | ORAL_SOLUTION | Freq: Four times a day (QID) | ORAL | Status: DC | PRN

## 2020-08-12 MED ORDER — MAGNESIUM OXIDE 400 (241.3 MG) MG PO TABS
400.0000 mg | ORAL_TABLET | Freq: Every day | ORAL | Status: DC
  Administered 2020-08-12 – 2020-08-20 (×9): 400 mg via ORAL
  Filled 2020-08-12 (×9): qty 1

## 2020-08-12 MED ORDER — ENOXAPARIN SODIUM 40 MG/0.4ML ~~LOC~~ SOLN
40.0000 mg | Freq: Every day | SUBCUTANEOUS | Status: DC
  Administered 2020-08-12 – 2020-08-19 (×8): 40 mg via SUBCUTANEOUS
  Filled 2020-08-12 (×8): qty 0.4

## 2020-08-12 MED ORDER — SENNA 8.6 MG PO TABS
2.0000 | ORAL_TABLET | Freq: Every day | ORAL | Status: DC
  Administered 2020-08-12 – 2020-08-20 (×9): 17.2 mg via ORAL
  Filled 2020-08-12 (×9): qty 2

## 2020-08-12 MED ORDER — OXYBUTYNIN CHLORIDE 5 MG PO TABS
5.0000 mg | ORAL_TABLET | Freq: Three times a day (TID) | ORAL | Status: DC
  Administered 2020-08-12 – 2020-08-20 (×26): 5 mg via ORAL
  Filled 2020-08-12 (×28): qty 1

## 2020-08-12 MED ORDER — DIPHENHYDRAMINE HCL 12.5 MG/5ML PO ELIX: 12.5000 mg | ORAL_SOLUTION | Freq: Four times a day (QID) | ORAL | Status: DC | PRN

## 2020-08-12 MED ORDER — LOSARTAN POTASSIUM 50 MG PO TABS
50.0000 mg | ORAL_TABLET | Freq: Two times a day (BID) | ORAL | Status: DC
  Administered 2020-08-12 – 2020-08-20 (×17): 50 mg via ORAL
  Filled 2020-08-12 (×17): qty 1

## 2020-08-12 MED ORDER — POLYSACCHARIDE IRON COMPLEX 150 MG PO CAPS
150.0000 mg | ORAL_CAPSULE | Freq: Two times a day (BID) | ORAL | Status: DC
  Administered 2020-08-12 – 2020-08-20 (×16): 150 mg via ORAL
  Filled 2020-08-12 (×17): qty 1

## 2020-08-12 MED ORDER — FLEET ENEMA 7-19 GM/118ML RE ENEM: 1.0000 | ENEMA | Freq: Once | RECTAL | Status: DC | PRN

## 2020-08-12 MED ORDER — FUROSEMIDE 20 MG PO TABS
20.0000 mg | ORAL_TABLET | Freq: Two times a day (BID) | ORAL | Status: AC
  Administered 2020-08-12 – 2020-08-13 (×2): 20 mg via ORAL
  Filled 2020-08-12 (×2): qty 1

## 2020-08-12 MED ORDER — PANTOPRAZOLE SODIUM 40 MG PO TBEC
40.0000 mg | DELAYED_RELEASE_TABLET | Freq: Two times a day (BID) | ORAL | Status: DC
  Administered 2020-08-12 – 2020-08-20 (×17): 40 mg via ORAL
  Filled 2020-08-12 (×17): qty 1

## 2020-08-12 MED ORDER — JUVEN PO PACK
1.0000 | PACK | Freq: Two times a day (BID) | ORAL | Status: DC
  Administered 2020-08-12 – 2020-08-20 (×17): 1 via ORAL
  Filled 2020-08-12 (×17): qty 1

## 2020-08-12 MED ORDER — MAGNESIUM OXIDE 400 (241.3 MG) MG PO TABS: 400.0000 mg | ORAL_TABLET | Freq: Every day | ORAL | Status: DC

## 2020-08-12 MED ORDER — ALUM & MAG HYDROXIDE-SIMETH 200-200-20 MG/5ML PO SUSP: 30.0000 mL | ORAL | Status: DC | PRN

## 2020-08-12 MED ORDER — PROCHLORPERAZINE MALEATE 5 MG PO TABS: 5.0000 mg | ORAL_TABLET | Freq: Four times a day (QID) | ORAL | Status: DC | PRN

## 2020-08-12 MED ORDER — AZATHIOPRINE 50 MG PO TABS
150.0000 mg | ORAL_TABLET | Freq: Every day | ORAL | Status: DC
  Administered 2020-08-12 – 2020-08-20 (×9): 150 mg via ORAL
  Filled 2020-08-12 (×9): qty 3

## 2020-08-12 MED ORDER — POLYETHYLENE GLYCOL 3350 17 G PO PACK: 17.0000 g | PACK | Freq: Every day | ORAL | Status: DC | PRN

## 2020-08-12 MED ORDER — TRAZODONE HCL 50 MG PO TABS
25.0000 mg | ORAL_TABLET | Freq: Every evening | ORAL | Status: DC | PRN
  Administered 2020-08-13: 50 mg via ORAL
  Filled 2020-08-12: qty 1

## 2020-08-12 MED ORDER — ROPINIROLE HCL 1 MG PO TABS
4.0000 mg | ORAL_TABLET | Freq: Every day | ORAL | Status: DC
  Administered 2020-08-12 – 2020-08-19 (×7): 4 mg via ORAL
  Filled 2020-08-12 (×8): qty 4

## 2020-08-12 MED ORDER — LEVOTHYROXINE SODIUM 88 MCG PO TABS
88.0000 ug | ORAL_TABLET | Freq: Every day | ORAL | Status: DC
  Administered 2020-08-13 – 2020-08-20 (×8): 88 ug via ORAL
  Filled 2020-08-12 (×8): qty 1

## 2020-08-12 MED ORDER — ACETAMINOPHEN 325 MG PO TABS
325.0000 mg | ORAL_TABLET | ORAL | Status: DC | PRN
  Administered 2020-08-14 – 2020-08-19 (×3): 650 mg via ORAL
  Filled 2020-08-12 (×3): qty 2

## 2020-08-12 NOTE — Progress Notes (Addendum)
Inpatient Rehabilitation Medication Review by a Pharmacist  A complete drug regimen review was completed for this patient to identify any potential clinically significant medication issues.  Clinically significant medication issues were identified:  yes   Type of Medication Issue Identified Description of Issue Urgent (address now) Non-Urgent (address on AM team rounds) Plan Plan Accepted by Provider? (Yes / No / Pending AM Rounds)                                      Other  dc summary said to resume/cont the PRN lasix, HCTZ, zofran, prednisone, B12. Non-urgent Secure chat PA Laxix and furosemide started    Name of provider notified for urgent issues identified: Algis Liming, PA  Provider Method of Notification: Secure chat   For non-urgent medication issues to be resolved on team rounds tomorrow morning a [facility] Secure Chat Handoff was sent to:    Pharmacist comments:   Time spent performing this drug regimen review (minutes):  10   [place] 08/12/2020 2:54 PM

## 2020-08-12 NOTE — Progress Notes (Signed)
Patient admitted to [phone].  Alert and oriented X 4.  No complaints of pain.  Oriented to room, unit, visitor and fall risk policies, call bell and urinal in reach.

## 2020-08-12 NOTE — H&P (Signed)
Physical Medicine and Rehabilitation Admission H&P    CC: Cervical myelopathy with functional deficits.   HPI: Francisco [name] is a 74 year old male with history of HTN, MG, COPD, CKD-3,  SOB/OSA--non compliant with CPAP, prostate cancer s/p radical prostatectomy, CAS- plans for stent in the future, lumbar and cervical stenosis s/p ACDF C5-C7 who started having acute on chronic weakness with spasticity and neurogenic bladder 07/14/20 and MRI reveled moderate to severe spinal stenosis C3-C4 with hyperintense signal due to compressive changes. He was evaluated by Dr. Cari Caraway and admitted to [facility] on 1126/21 for C3-C5 decompressive laminectomy with posterior lateral arthrodesis C3-C6. Post op to wear collar when OOB. Drain removed 11/29 and on lovenox for DVR prophylaxis. Therapy ongoing and patient limited by  BUE weakness, impaired standing balance, LLE weakness and unsteady gait. CIR recommended due to functional decline.    Review of Systems  Constitutional: Negative.   HENT: Negative.   Eyes: Negative.   Respiratory: Negative.   Cardiovascular: Negative.   Gastrointestinal: Negative.   Genitourinary: Negative.   Musculoskeletal: Positive for neck pain.  Neurological: Positive for tingling, focal weakness and weakness.  All other systems reviewed and are negative.     Past Medical History:  Diagnosis Date  . Arthritis    lower left hip  . Atypical angina ([place])   . Bilateral hand numbness    from back surgery  . Bronchitis, chronic ([place])   . Cancer [facility])    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; Merkel cell cancer, and Basal cell cancer (twice; 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  . Carotid stenosis   . CKD (chronic kidney disease) stage 3, GFR 30-59 ml/min (HCC)   . 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 DDD (degenerative disc disease), cervical   . Hypercholesterolemia   . Hypertension   . Hypothyroidism    pt takes Levothyroxine daily  . Lumbosacral spinal  stenosis   . Myasthenia gravis, adult form ([place])   . PAD (peripheral artery disease) ([place])   . Shortness of breath    Lung MD- Dr Darlin Coco  . Sleep apnea    do not use CPAP every night    Past Surgical History:  Procedure Laterality Date  . ANTERIOR CERVICAL DECOMP/DISCECTOMY FUSION  07/18/2011   Procedure: ANTERIOR CERVICAL DECOMPRESSION/DISCECTOMY FUSION 2 LEVELS;  Surgeon: Cooper Render Pool;  Location: [place] [facility];  Service: Neurosurgery;  Laterality: N/A;  cervical five-six, cervical six-seven anterior cervical discectomy and fusion  . BACK SURGERY     in [place]     01/2020 Right, 04/2020 Left  . CARDIAC CATHETERIZATION     2005 at [facility], no stents  . CATARACT EXTRACTION W/PHACO Left 01/06/2020   Procedure: CATARACT EXTRACTION PHACO AND INTRAOCULAR LENS PLACEMENT (IOC) ISTENT INJ LEFT 3.81  00:33.3;  Surgeon: Eulogio Bear, MD;  Location: [place];  Service: Ophthalmology;  Laterality: Left;  . CATARACT EXTRACTION W/PHACO Right 02/03/2020   Procedure: CATARACT EXTRACTION PHACO AND INTRAOCULAR LENS PLACEMENT ([place]) RIGHT ISTENT INJ;  Surgeon: Eulogio Bear, MD;  Location: [place];  Service: Ophthalmology;  Laterality: Right;  4.29 0:35.6  . COLONOSCOPY    . HERNIA REPAIR Left    inguinal hernia repair in 1985  . LUMBAR LAMINECTOMY/DECOMPRESSION MICRODISCECTOMY Left 02/24/2014   Procedure: LUMBAR LAMINECTOMY/DECOMPRESSION MICRODISCECTOMY LUMBAR THREE-FOUR, FOUR-FIVE, LEFT FIVE-SACRAL ONE ;  Surgeon: Charlie Pitter, MD;  Location: [facility];  Service: Neurosurgery;  Laterality: Left;  LUMBAR LAMINECTOMY/DECOMPRESSION MICRODISCECTOMY LUMBAR THREE-FOUR, FOUR-FIVE, LEFT FIVE-SACRAL ONE   . POSTERIOR CERVICAL FUSION/FORAMINOTOMY N/A 08/07/2020   Procedure: C3-6 POSTERIOR FUSION WITH DECOMPRESSION;  Surgeon: Meade Maw, MD;  Location: [facility];  Service: Neurosurgery;  Laterality: N/A;  . PROSTATECTOMY  8/14    [facility] Dr Mare Ferrari     Family History  Problem Relation Age of Onset  . Hypertension Mother   . Stroke Mother   . Stroke Father     Social History:  reports that he quit smoking about 18 years ago. His smoking use included cigarettes. He has a 20.00 pack-year smoking history. He has never used smokeless tobacco. He reports current alcohol use. He reports that he does not use drugs.   Allergies  Allergen Reactions  . Azithromycin Other (See Comments)    Avoid due to myasthenia gravis  . Codeine Nausea And Vomiting    Medications Prior to Admission  Medication Sig Dispense Refill  . aspirin 81 MG tablet Take 81 mg by mouth every other day.     . azaTHIOprine (IMURAN) 50 MG tablet Take 150 mg by mouth daily.     . furosemide (LASIX) 20 MG tablet Take 20 mg by mouth 2 (two) times daily as needed (swelling).     . gabapentin (NEURONTIN) 300 MG capsule Take 300 mg by mouth 2 (two) times daily.    . hydrochlorothiazide (HYDRODIURIL) 25 MG tablet Take 12.5 mg by mouth daily.   3  . HYDROcodone-acetaminophen (NORCO) 10-325 MG tablet Take 1 tablet by mouth every 6 (six) hours as needed (pain).     [place] Kitchen levothyroxine (SYNTHROID, LEVOTHROID) 88 MCG tablet Take 88 mcg by mouth daily.      [place] Kitchen losartan (COZAAR) 50 MG tablet Take 50 mg by mouth 2 (two) times daily.     . Magnesium 500 MG TABS Take 500 mg by mouth daily.     [place] Kitchen oxybutynin (DITROPAN) 5 MG tablet Take 5 mg by mouth 3 (three) times daily.    . pantoprazole (PROTONIX) 40 MG tablet Take 40 mg by mouth 2 (two) times daily before a meal.     . predniSONE (DELTASONE) 20 MG tablet Take 20 mg by mouth every other day.     . pyridostigmine (MESTINON) 60 MG tablet Take 60 mg by mouth in the morning and at bedtime.     [place] Kitchen rOPINIRole (REQUIP) 4 MG tablet Take 4 mg by mouth at bedtime.     . vitamin B-12 (CYANOCOBALAMIN) 1000 MCG tablet Take 1,000 mcg by mouth daily.      Drug Regimen Review  Drug regimen was reviewed and remains appropriate  with no significant issues identified  Home: Home Living Family/patient expects to be discharged to:: Private residence Living Arrangements: Spouse/significant other Available Help at Discharge: Family, Available PRN/intermittently Type of Home: House Home Access: Stairs to enter Technical brewer of Steps: 3 Entrance Stairs-Rails: Left Home Layout: One level Bathroom Shower/Tub: Chiropodist: Handicapped height Bathroom Accessibility: Yes Home Equipment: Environmental consultant - 2 wheels, Cane - single point, Wheelchair - manual, Grab bars - tub/shower, Grab bars - toilet, Hand held shower head, Adaptive equipment Adaptive Equipment: Sock aid, Long-handled shoe horn, Long-handled sponge Additional Comments: had a reacher but it's broken  Lives With: Spouse   Functional History: Prior Function Level of Independence: Needs assistance Gait / Transfers Assistance Needed: 6 weeks ago pt was independent (going snow tubing) then began to need RW for mobility, 3  weeks ago pt required w/c for mobility ADL's / Homemaking Assistance Needed: spouse has recently been assisting with tub transfers and LB dressing; couple primarily gets take out/don't cook much, pt indep with med mgt, spouse has been driving since pt starting having more difficulty; pt wearing incontinence briefs recently 2/2 issues and spouse assisting particularly with overnight toileting to change briefs, have fabric chucks at home Comments: 6 weeks ago pt was independent (going snow tubing) then began to need RW for mobility, 3 weeks ago pt required w/c for mobility  Functional Status:  Mobility: Bed Mobility Overal bed mobility: Needs Assistance Bed Mobility: Rolling, Sidelying to Sit, Sit to Supine Rolling: Min assist (bed rails) Sidelying to sit: Min assist, HOB elevated (bed rails) Supine to sit: Mod assist, Min assist Sit to supine: Mod assist, HOB elevated Sit to sidelying: Mod assist General bed mobility  comments: in recliner before and after session Transfers Overall transfer level: Needs assistance Equipment used: Rolling walker (2 wheeled) Transfers: Sit to/from Stand Sit to Stand: Min assist Stand pivot transfers: Max assist, +2 physical assistance (max assist & 2nd person present for safety) General transfer comment: Cues for hand placement and to scoot towards edge of recliner prior to attempt with improved performance noted on 2nd attempt Ambulation/Gait Ambulation/Gait assistance: Min assist, +2 safety/equipment Gait Distance (Feet): 30 Feet Assistive device: Rolling walker (2 wheeled) Gait Pattern/deviations: Step-through pattern, Decreased step length - right, Decreased step length - left General Gait Details: +2 assist with +3 for recliner follow for safety.  Generally unsteady with L knee hyperextension noted. Gait velocity: decreased    ADL: ADL Overall ADL's : Needs assistance/impaired Eating/Feeding: With caregiver independent assisting Eating/Feeding Details (indicate cue type and reason): spouse feeding pt upon OT's arrival Grooming: Sitting, Minimal assistance Upper Body Bathing: Sitting, Minimal assistance, Moderate assistance Lower Body Bathing: Sitting/lateral leans, Moderate assistance Upper Body Dressing : Sitting, Minimal assistance, Moderate assistance Lower Body Dressing: Sitting/lateral leans, Moderate assistance Toilet Transfer: RW, Ambulation, BSC, Cueing for sequencing, Cueing for safety, Minimal assistance Toileting- Clothing Manipulation and Hygiene: Maximal assistance, Sit to/from stand Toileting - Clothing Manipulation Details (indicate cue type and reason): pt typically will use grab bar at home in front of him to pull up; CGA-Min A to stand with RW and spouse performed pericare Functional mobility during ADLs: Minimal assistance, Cueing for sequencing, Cueing for safety, Rolling walker  Cognition: Cognition Overall Cognitive Status: Within  Functional Limits for tasks assessed Orientation Level: Oriented X4 Cognition Arousal/Alertness: Awake/alert Behavior During Therapy: WFL for tasks assessed/performed Overall Cognitive Status: Within Functional Limits for tasks assessed General Comments: cues for redirecting/safety at times  Physical Exam: Blood pressure (!) 142/64, pulse 76, temperature 97.7 F (36.5 C), temperature source Oral, resp. rate 16, height 5\' 10"  (1.778 m), weight 108 kg, SpO2 100 %. Physical Exam Vitals reviewed.  Constitutional:      General: He is not in acute distress.    Appearance: He is obese.  HENT:     Head: Normocephalic and atraumatic.     Right Ear: External ear normal.     Left Ear: External ear normal.     Nose: Nose normal.  Eyes:     General:        Right eye: No discharge.        Left eye: No discharge.     Extraocular Movements: Extraocular movements intact.  Neck:     Comments: C-collar in place Cardiovascular:     Rate and Rhythm: Normal rate  and regular rhythm.  Pulmonary:     Effort: Pulmonary effort is normal. No respiratory distress.     Breath sounds: No stridor.  Abdominal:     General: Abdomen is flat. Bowel sounds are normal. There is no distension.  Musculoskeletal:     Comments: No edema or tenderness in extremities   Skin:    General: Skin is warm and dry.  Neurological:     Mental Status: He is alert.     Comments: Alert and oriented Bilateral upper extremities: 5/5 proximal distal Right lower extremity: 5/5 proximal distal Left lower extremity: Hip flexion 4/5, knee extension 4+/5, dorsiflexion 3/5 Sensation diminished to light touch in bilateral hands and left foot  Psychiatric:        Mood and Affect: Mood normal.        Behavior: Behavior normal.        Thought Content: Thought content normal.        Judgment: Judgment normal.     No results found for this or any previous visit (from the past 48 hour(s)). DG Cervical Spine 2 or 3 views  Result  Date: 08/11/2020 CLINICAL DATA:  Cervical posterior fusion C3 through C6. EXAM: CERVICAL SPINE - 2-3 VIEW COMPARISON:  08/07/2020 FINDINGS: ACDF C5 through C7 previously performed. Recent posterior fusion with lateral mass screws and rods extending from C3 through C6. Hardware in satisfactory position. Posterior skin staples noted. No acute skeletal abnormality. IMPRESSION: Bilateral screw C3 through C6 without complication and rod fusion From previously placed anterior fusion C5 through C7 with hardware. Electronically Signed   By: Franchot Gallo M.D.   On: 08/11/2020 16:29   Medical Problem List and Plan: 1.  Impaired mobility and ADLs secondary to cervical myelopathy  -patient may not shower  -ELOS/Goals: 10-14 days/supervision/mod I  Admit to CIR 2.  Antithrombotics: -DVT/anticoagulation:  Pharmaceutical: Lovenox  -antiplatelet therapy: N/A 3. Pain Management: Now on tylenol prn as well as  Celebrex bid DC'd.  Continue gabapentin 300 mg tid, adjust as necessary  Oxycodone prn.   Monitor with increased exertion 4. Mood: LCSW to follow for evaluation and support.   -antipsychotic agents: N/A 5. Neuropsych: This patient is capable of making decisions on his own behalf. 6. Skin/Wound Care: Monitor wound for healing.  7. Fluids/Electrolytes/Nutrition: Monitor I/Os.   CMP ordered for tomorrow a.m. 8. COPD/OSA: Uses oxygen 2 L at bedime per records.  9. CKD stage 3: Baseline SCr around 1.39-1.5 in the past couple of months. 1.39 on 11/18.   CMP ordered for tomorrow a.m. 10. MG: Has been stable on Imuran 150 mg/day and Mestinon 60 mg bid.    Regulate therapies if necessary 11. Anemia of chronic disease: Pre-op H/H- 10.4/32.7.   CBC ordered for tomorrow a.m. 12.  Hypertension: Monitor  Cozaar  Monitor with increased mobility  13.  Leucocytosis: WBC 10.7 on 11/18.   CBC ordered for tomorrow a.m. 14. RLS: managed by ropinirole at bedtime.   Bary Leriche, PA-C 08/12/2020   I have  personally performed a face to face diagnostic evaluation, including, but not limited to relevant history and physical exam findings, of this patient and developed relevant assessment and plan.  Additionally, I have reviewed and concur with the physician assistant's documentation above.  Delice Lesch, MD, ABPMR  The patient's status has not changed. Any changes from the pre-admission screening or documentation from the acute chart are noted above.   Delice Lesch, MD, ABPMR

## 2020-08-12 NOTE — Progress Notes (Signed)
Patient discharged to [facility] via stretcher

## 2020-08-12 NOTE — Progress Notes (Signed)
Inpatient Rehabilitation  Patient information reviewed and entered into eRehab system by Kayshawn Ozburn M. Gaberial Cada, M.A., CCC/SLP, PPS Coordinator.  Information including medical coding, functional ability and quality indicators will be reviewed and updated through discharge.    

## 2020-08-12 NOTE — Progress Notes (Signed)
Izora Ribas, MD  Physician  Physical Medicine and Rehabilitation  PMR Pre-admission      Signed  Date of Service:  08/09/2020 10:26 AM      Related encounter: Admission (Discharged) from 08/07/2020 in [place] (1A)      Signed       PMR Admission Coordinator Pre-Admission Assessment   Patient: Francisco [name] is an 74 y.o., male MRN: [id number] DOB: [birth year] Height: _0  (177.8 cm) Weight: 108 kg   Insurance Information HMO:     PPO:      PCP:      IPA:      80/20:      OTHER:  PRIMARY: U[employer] Medicare       Policy#: [id number]      Subscriber: Pt. CM Name:      Phone#:  [phone]     Fax#: [phone] Bernadene Bell) Pre-Cert#: [id number]       Employer:  Benefits:  Phone #: #:  [phone]      Name:         Eff. Date: 12/12/2019 - 09/11/2020  Deduct: does not have ($0)  Out of Pocket Max: $3,900 ($2,317.66 met)     Life Max: n/a CIR: $345/day co-pay for days 1-5, $0/day co-pay for days 6+ SNF: $0/day copay for days 1-20, $184/day copay for days 21-42, $0/day copay for days 43-100; limited to 100 days/cal yr Outpatient: $35/visit co-pay; limited by medical necessity Home Health: 100% coverage, 0% co-insurance; limited by medical necessity DME:80% coverage; 20% co-insurance Providers: In network   SECONDARY: none          The "Data Collection Information Summary" for patients in Inpatient Rehabilitation Facilities with attached "Privacy Act [place] Records" was provided and verbally reviewed with: Patient and Family   Emergency Contact Information         Contact Information     Name Relation Home Work Mobile    [place] T Spouse [phone]   [phone]         Current Medical History  Patient Admitting Diagnosis: Cervical Myelopathy s/p fusion with decompression History of Present Illness: Pt is a 74 y/o male with past medical history significant for atypical angina, PAD, carotid stenosis,  HTN, HLD, COPD, Myasthenia gravis, hypothyroidism, CKD-III, OSA requiring nocturnal PAP therapy, OA, cervical DDD, lumbosacral spinal stenosis, chronic DOE 2/2 COPD of cervical myelopathy & admitted on 08/07/20 for scheduled C3-6 Posterior fusion with decompression by Dr. Izora Ribas. Pt. Admitted following procedure and therapies noted significant functional decline. PT/OT are recommending CIR following discharge.    Patient's medical record from [facility]  has been reviewed by the rehabilitation admission coordinator and physician.   Past Medical History      Past Medical History:  Diagnosis Date  . Arthritis      lower left hip  . Atypical angina ([place])    . Bilateral hand numbness      from back surgery  . Bronchitis, chronic ([place])    . 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 Carotid stenosis    . CKD (chronic kidney disease) stage 3, GFR 30-59 ml/min (HCC)    . COPD (chronic obstructive pulmonary disease) (HCC)      stage 2  . DDD (degenerative disc disease), cervical    . Hypercholesterolemia    . Hypertension    . Hypothyroidism  pt takes Levothyroxine daily  . Lumbosacral spinal stenosis    . Myasthenia gravis, adult form ([place])    . PAD (peripheral artery disease) ([place])    . Shortness of breath      Lung MD- Dr Darlin Coco  . Sleep apnea      do not use CPAP every night      Family History   family history includes Hypertension in his mother; Stroke in his father and mother.   Prior Rehab/Hospitalizations Has the patient had prior rehab or hospitalizations prior to admission? Yes   Has the patient had major surgery during 100 days prior to admission? Yes              Current Medications   Current Facility-Administered Medications:  .  acetaminophen (TYLENOL) tablet 650 mg, 650 mg, Oral, Q6H PRN, Meade Maw, MD .  alum & mag hydroxide-simeth  (MAALOX/MYLANTA) 200-200-20 MG/5ML suspension 30 mL, 30 mL, Oral, Q6H PRN, Zdeb, Christine, NP .  azaTHIOprine (IMURAN) tablet 150 mg, 150 mg, Oral, Daily, Zdeb, Christine, NP, 150 mg at 08/11/20 0910 .  bisacodyl (DULCOLAX) EC tablet 5 mg, 5 mg, Oral, Daily PRN, Zdeb, Christine, NP .  celecoxib (CELEBREX) capsule 100 mg, 100 mg, Oral, Q12H, Zdeb, Christine, NP, 100 mg at 08/11/20 0911 .  docusate sodium (COLACE) capsule 100 mg, 100 mg, Oral, BID, Zdeb, Christine, NP, 100 mg at 08/11/20 0910 .  enoxaparin (LOVENOX) injection 55 mg, 0.5 mg/kg, Subcutaneous, Q24H, Meade Maw, MD, 55 mg at 08/10/20 2259 .  gabapentin (NEURONTIN) capsule 300 mg, 300 mg, Oral, TID, Meade Maw, MD, 300 mg at 08/11/20 0910 .  levothyroxine (SYNTHROID) tablet 88 mcg, 88 mcg, Oral, Daily, Zdeb, Christine, NP, 88 mcg at 08/11/20 0620 .  losartan (COZAAR) tablet 50 mg, 50 mg, Oral, BID, Zdeb, Christine, NP, 50 mg at 08/10/20 2257 .  magnesium oxide (MAG-OX) tablet 400 mg, 400 mg, Oral, Daily, Meade Maw, MD, 400 mg at 08/11/20 0910 .  menthol-cetylpyridinium (CEPACOL) lozenge 3 mg, 1 lozenge, Oral, PRN **OR** phenol (CHLORASEPTIC) mouth spray 1 spray, 1 spray, Mouth/Throat, PRN, Zdeb, Christine, NP .  ondansetron (ZOFRAN) tablet 4 mg, 4 mg, Oral, Q6H PRN **OR** ondansetron (ZOFRAN) injection 4 mg, 4 mg, Intravenous, Q6H PRN, Zdeb, Christine, NP .  oxybutynin (DITROPAN) tablet 5 mg, 5 mg, Oral, TID, Zdeb, Christine, NP, 5 mg at 08/11/20 0910 .  oxyCODONE (Oxy IR/ROXICODONE) immediate release tablet 10 mg, 10 mg, Oral, Q3H PRN, Zdeb, Christine, NP, 10 mg at 08/11/20 1133 .  oxyCODONE (Oxy IR/ROXICODONE) immediate release tablet 5 mg, 5 mg, Oral, Q3H PRN, Zdeb, Christine, NP, 5 mg at 08/11/20 2423 .  pantoprazole (PROTONIX) EC tablet 40 mg, 40 mg, Oral, BID AC, Zdeb, Christine, NP, 40 mg at 08/11/20 0910 .  polyethylene glycol (MIRALAX / GLYCOLAX) packet 17 g, 17 g, Oral, Daily PRN, Zdeb, Christine, NP .   predniSONE (DELTASONE) tablet 20 mg, 20 mg, Oral, QODAY, Zdeb, Christine, NP, 20 mg at 08/10/20 1118 .  pyridostigmine (MESTINON) tablet 60 mg, 60 mg, Oral, Q12H, Zdeb, Christine, NP, 60 mg at 08/11/20 0911 .  rOPINIRole (REQUIP) tablet 4 mg, 4 mg, Oral, QHS, Zdeb, Christine, NP, 4 mg at 08/10/20 2257 .  senna (SENOKOT) tablet 17.2 mg, 2 tablet, Oral, BID, Zdeb, Christine, NP, 17.2 mg at 08/11/20 0910 .  sodium chloride flush (NS) 0.9 % injection 3 mL, 3 mL, Intravenous, Q12H, Zdeb, Christine, NP, 3 mL at 08/11/20 0914 .  sodium chloride flush (NS)  0.9 % injection 3 mL, 3 mL, Intravenous, PRN, Zdeb, Christine, NP, 3 mL at 08/10/20 1124 .  sodium phosphate (FLEET) 7-19 GM/118ML enema 1 enema, 1 enema, Rectal, Once PRN, Zdeb, Christine, NP   Patients Current Diet:     Diet Order                      Diet regular Room service appropriate? Yes; Fluid consistency: Thin  Diet effective now                      Precautions / Restrictions Precautions Precautions: Cervical, Fall Precaution Booklet Issued: Yes (comment) Precaution Comments: no bending, lifting >10lbs, twisting, arching Cervical Brace: At all times, Hard collar Restrictions Weight Bearing Restrictions: No Other Position/Activity Restrictions: Pt stating today MD said he only needed to wear brace when walking/transfering and could take it off in chair.    Has the patient had 2 or more falls or a fall with injury in the past year? Yes   Prior Activity Level Community (5-7x/wk): Pt. went out frequently and driving PTA   Prior Functional Level Self Care: Did the patient need help bathing, dressing, using the toilet or eating? Independent   Indoor Mobility: Did the patient need assistance with walking from room to room (with or without device)? Independent   Stairs: Did the patient need assistance with internal or external stairs (with or without device)? Independent   Functional Cognition: Did the patient need help  planning regular tasks such as shopping or remembering to take medications? [place] / Equipment Home Assistive Devices/Equipment: Gilford Rile (specify type), Oxygen Home Equipment: Walker - 2 wheels, Cane - single point, Wheelchair - manual, Grab bars - tub/shower, Grab bars - toilet, Hand held shower head, Adaptive equipment   Prior Device Use: Indicate devices/aids used by the patient prior to current illness, exacerbation or injury? Walker   Current Functional Level Cognition   Overall Cognitive Status: Within Functional Limits for tasks assessed Orientation Level: Oriented X4 General Comments: cues for redirecting/safety at times    Extremity Assessment (includes Sensation/Coordination)   Upper Extremity Assessment: RUE deficits/detail, LUE deficits/detail RUE Deficits / Details: limited shoulder strength assessment 2/2 pain in R neck/shoulder, good elbow ext/flex, good grip, impaired FMC, hand eye, RAM, and sensation in hand/fingers causing pt to have difficulty with sustained motor control for [facility] tasks including self feeding, grooming RUE: Unable to fully assess due to pain RUE Sensation: decreased light touch, decreased proprioception RUE Coordination: decreased fine motor LUE Deficits / Details: fair shoulder, good elbow ext/flex, good grip, impaired FMC, hand eye, RAM, and sensation in hand/fingers as well as UE causing pt to have difficulty with sustained motor control for [facility] tasks including self feeding, grooming LUE Sensation: decreased light touch, decreased proprioception LUE Coordination: decreased fine motor  Lower Extremity Assessment:  (grossly 3+/5 as pt able to stand with RW without buckling noted, limited AROM L ankle dorsiflexion but PROM WNL, impaired heel to shin BLE (L slightly worse than R), mild decreased sensation LLE, BLE proprioception impaired; B foot +1 pitting edema)     ADLs   Overall ADL's : Needs  assistance/impaired Eating/Feeding: With caregiver independent assisting Eating/Feeding Details (indicate cue type and reason): spouse feeding pt upon OT's arrival Grooming: Sitting, Minimal assistance Upper Body Bathing: Sitting, Minimal assistance, Moderate assistance Lower Body Bathing: Sitting/lateral leans, Moderate assistance Upper Body Dressing : Sitting, Minimal assistance, Moderate assistance Lower Body Dressing: Sitting/lateral  leans, Moderate assistance Toilet Transfer: RW, Ambulation, BSC, Cueing for sequencing, Cueing for safety, Minimal assistance Toileting- Clothing Manipulation and Hygiene: Maximal assistance, Sit to/from stand Toileting - Clothing Manipulation Details (indicate cue type and reason): pt typically will use grab bar at home in front of him to pull up; CGA-Min A to stand with RW and spouse performed pericare Functional mobility during ADLs: Minimal assistance, Cueing for sequencing, Cueing for safety, Rolling walker     Mobility   Overal bed mobility: Needs Assistance Bed Mobility: Rolling, Sidelying to Sit, Sit to Supine Rolling: Min assist (bed rails) Sidelying to sit: Min assist, HOB elevated (bed rails) Supine to sit: Mod assist, Min assist Sit to supine: Mod assist, HOB elevated Sit to sidelying: Mod assist General bed mobility comments: in recliner before and after session     Transfers   Overall transfer level: Needs assistance Equipment used: Rolling walker (2 wheeled) Transfers: Sit to/from Stand Sit to Stand: Min assist Stand pivot transfers: Max assist, +2 physical assistance (max assist & 2nd person present for safety) General transfer comment: Cues for hand placement and to scoot towards edge of recliner prior to attempt with improved performance noted on 2nd attempt     Ambulation / Gait / Stairs / Wheelchair Mobility   Ambulation/Gait Ambulation/Gait assistance: Min assist, +2 safety/equipment Gait Distance (Feet): 30 Feet Assistive  device: Rolling walker (2 wheeled) Gait Pattern/deviations: Step-through pattern, Decreased step length - right, Decreased step length - left General Gait Details: +2 assist with +3 for recliner follow for safety.  Generally unsteady with L knee hyperextension noted. Gait velocity: decreased     Posture / Balance Dynamic Sitting Balance Sitting balance - Comments: BUE support & CGA/min assist for dynamic sitting balance EOB Balance Overall balance assessment: Needs assistance Sitting-balance support: Bilateral upper extremity supported, Feet supported Sitting balance-Leahy Scale: Fair Sitting balance - Comments: BUE support & CGA/min assist for dynamic sitting balance EOB Standing balance support: Bilateral upper extremity supported, During functional activity Standing balance-Leahy Scale: Poor Standing balance comment: BUE support on RW & min/mod assist for dynamic balance     Special needs/care consideration Skin : Surgical incision     Previous Home Environment (from acute therapy documentation) Living Arrangements: Spouse/significant other  Lives With: Spouse Available Help at Discharge: Family, Available PRN/intermittently Type of Home: House Home Layout: One level Home Access: Stairs to enter Entrance Stairs-Rails: Left Entrance Stairs-Number of Steps: 3 Bathroom Shower/Tub: Chiropodist: Handicapped height Bathroom Accessibility: Yes Home Care Services: No Additional Comments: had a reacher but it's broken   Discharge Living Setting Plans for Discharge Living Setting: Patient's home Type of Home at Discharge: House Discharge Home Layout: One level Discharge Home Access: Stairs to enter Entrance Stairs-Rails: Left Entrance Stairs-Number of Steps: 3 (3) Discharge Bathroom Shower/Tub: Tub/shower unit Discharge Bathroom Toilet: Handicapped height Discharge Bathroom Accessibility: Yes How Accessible: Accessible via walker Does the patient have any  problems obtaining your medications?: No   Social/Family/Support Systems Patient Roles: Spouse Contact Information: [phone]) Anticipated Caregiver: Brason Berthelot Anticipated Caregiver's Contact Information: [phone] Ability/Limitations of Caregiver: Can provide min A Caregiver Availability: 24/7 Discharge Plan Discussed with Primary Caregiver: Yes Is Caregiver In Agreement with Plan?: Yes Does Caregiver/Family have Issues with Lodging/Transportation while Pt is in Rehab?: Yes   Goals Patient/Family Goal for Rehab: PT/OT Supervision  Expected length of stay: 8-10 days Pt/Family Agrees to Admission and willing to participate: Yes Program Orientation Provided & Reviewed with Pt/Caregiver Including Roles  &  Responsibilities: Yes   Decrease burden of Care through IP rehab admission: Specialzed equipment needs, Decrease number of caregivers, Bowel and bladder program and Patient/family education   Possible need for SNF placement upon discharge: not anticipated    Patient Condition: I have reviewed medical records from [facility], spoken with CM, and patient and spouse. I met with patient at the bedside for inpatient rehabilitation assessment.  Patient will benefit from ongoing PT and OT, can actively participate in 3 hours of therapy a day 5 days of the week, and can make measurable gains during the admission.  Patient will also benefit from the coordinated team approach during an Inpatient Acute Rehabilitation admission.  The patient will receive intensive therapy as well as Rehabilitation physician, nursing, social worker, and care management interventions.  Due to bladder management, safety, skin/wound care, disease management, medication administration, pain management and patient education the patient requires 24 hour a day rehabilitation nursing.  The patient is currently min A-mod A with mobility and basic ADLs.  Discharge setting and therapy post  discharge at home with home health is anticipated.  Patient has agreed to participate in the Acute Inpatient Rehabilitation Program and will admit today.   Preadmission Screen Completed By:  Genella Mech, 08/11/2020 12:26 PM ______________________________________________________________________   Discussed status with Dr. Ranell Patrick  on 08/11/20  at 900 am and received approval for admission today.   Admission Coordinator:  Genella Mech, CCC-SLP, time 1227/Date 08/11/20    Assessment/Plan: Diagnosis: Cervical myelopathy s/p C3-C6 PSFD 1. Does the need for close, 24 hr/day Medical supervision in concert with the patient's rehab needs make it unreasonable for this patient to be served in a less intensive setting? Yes 2. Co-Morbidities requiring supervision/potential complications: lumbar spinal stenosis with neurogenic claudication, COPD, myasthenia gravis, benign essential hypertension, bilateral carotid artery disease, anemia, history of prostate cancer 3. Due to bladder management, bowel management, safety, skin/wound care, disease management, medication administration, pain management and patient education, does the patient require 24 hr/day rehab nursing? Yes 4. Does the patient require coordinated care of a physician, rehab nurse, PT, OT to address physical and functional deficits in the context of the above medical diagnosis(es)? Yes Addressing deficits in the following areas: balance, endurance, locomotion, strength, transferring, bowel/bladder control, bathing, dressing, feeding, grooming, toileting and psychosocial support 5. Can the patient actively participate in an intensive therapy program of at least 3 hrs of therapy 5 days a week? Yes 6. The potential for patient to make measurable gains while on inpatient rehab is excellent 7. Anticipated functional outcomes upon discharge from inpatient rehab: modified independent PT, modified independent OT, independent SLP 8. Estimated rehab  length of stay to reach the above functional goals is: 9-11 days  9. Anticipated discharge destination: Home 10. Overall Rehab/Functional Prognosis: excellent     MD Signature: Leeroy Cha, MD        Revision History                                    Note Details  Author Izora Ribas, MD File Time 08/11/2020 12:32 PM  Author Type Physician Status Signed  Last Editor Izora Ribas, MD Service Physical Medicine and [place] # [id number] [place] Date 08/12/2020

## 2020-08-13 ENCOUNTER — Inpatient Hospital Stay (HOSPITAL_COMMUNITY): Payer: Medicare Other

## 2020-08-13 ENCOUNTER — Inpatient Hospital Stay (HOSPITAL_COMMUNITY): Payer: Medicare Other | Admitting: Physical Therapy

## 2020-08-13 DIAGNOSIS — G959 Disease of spinal cord, unspecified: Secondary | ICD-10-CM

## 2020-08-13 MED ORDER — PREGABALIN 50 MG PO CAPS
50.0000 mg | ORAL_CAPSULE | Freq: Two times a day (BID) | ORAL | Status: DC
  Administered 2020-08-13 – 2020-08-19 (×13): 50 mg via ORAL
  Filled 2020-08-13 (×13): qty 1

## 2020-08-13 MED ORDER — OXYCODONE HCL 5 MG PO TABS
5.0000 mg | ORAL_TABLET | ORAL | Status: DC | PRN
  Administered 2020-08-13 – 2020-08-18 (×4): 10 mg via ORAL
  Administered 2020-08-18: 5 mg via ORAL
  Administered 2020-08-18 – 2020-08-19 (×2): 10 mg via ORAL
  Filled 2020-08-13 (×7): qty 2

## 2020-08-13 MED ORDER — DOCUSATE SODIUM 100 MG PO CAPS
100.0000 mg | ORAL_CAPSULE | Freq: Two times a day (BID) | ORAL | Status: DC
  Administered 2020-08-13 – 2020-08-20 (×15): 100 mg via ORAL
  Filled 2020-08-13 (×15): qty 1

## 2020-08-13 NOTE — Evaluation (Signed)
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 Arthritis    lower left hip  . Atypical angina ([place])   . Bilateral hand numbness    from back surgery  . Bronchitis, chronic ([place])   . Cancer [facility])    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; Merkel cell cancer, and Basal cell cancer (twice; 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  . Carotid stenosis   . CKD (chronic kidney disease) stage 3, GFR 30-59 ml/min (HCC)   . 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 DDD (degenerative disc disease), cervical   . Hypercholesterolemia   . Hypertension   . Hypothyroidism    pt takes Levothyroxine daily  . Lumbosacral spinal stenosis   . Myasthenia gravis, adult form ([place])   . PAD (peripheral artery disease) ([place])   . Shortness of breath    Lung MD- Dr Darlin Coco  . Sleep apnea    do not use CPAP every night   Past Surgical History:  Past Surgical History:  Procedure Laterality Date  . ANTERIOR CERVICAL DECOMP/DISCECTOMY FUSION  07/18/2011   Procedure: ANTERIOR CERVICAL DECOMPRESSION/DISCECTOMY FUSION 2 LEVELS;  Surgeon: Cooper Render Pool;  Location: [place] [facility];  Service: Neurosurgery;  Laterality: N/A;  cervical five-six, cervical six-seven anterior cervical discectomy and fusion  . BACK SURGERY     in [place]     01/2020 Right, 04/2020 Left  . CARDIAC CATHETERIZATION     2005 at [facility], no stents  . CATARACT EXTRACTION W/PHACO Left 01/06/2020   Procedure: CATARACT  EXTRACTION PHACO AND INTRAOCULAR LENS PLACEMENT (IOC) ISTENT INJ LEFT 3.81  00:33.3;  Surgeon: Eulogio Bear, MD;  Location: [place];  Service: Ophthalmology;  Laterality: Left;  . CATARACT EXTRACTION W/PHACO Right 02/03/2020   Procedure: CATARACT EXTRACTION PHACO AND INTRAOCULAR LENS PLACEMENT ([place]) RIGHT ISTENT INJ;  Surgeon: Eulogio Bear, MD;  Location: [place];  Service: Ophthalmology;  Laterality: Right;  4.29 0:35.6  . COLONOSCOPY    . HERNIA REPAIR Left    inguinal hernia repair in 1985  . LUMBAR LAMINECTOMY/DECOMPRESSION MICRODISCECTOMY Left 02/24/2014   Procedure: LUMBAR LAMINECTOMY/DECOMPRESSION MICRODISCECTOMY LUMBAR THREE-FOUR, FOUR-FIVE, LEFT FIVE-SACRAL ONE ;  Surgeon: Charlie Pitter, MD;  Location: [place] [facility];  Service: Neurosurgery;  Laterality: Left;  LUMBAR LAMINECTOMY/DECOMPRESSION MICRODISCECTOMY LUMBAR THREE-FOUR, FOUR-FIVE, LEFT FIVE-SACRAL ONE   . POSTERIOR CERVICAL FUSION/FORAMINOTOMY N/A 08/07/2020   Procedure: C3-6 POSTERIOR FUSION WITH DECOMPRESSION;  Surgeon: Meade Maw, MD;  Location: [facility] ORS;  Service: Neurosurgery;  Laterality: N/A;  . PROSTATECTOMY  8/14   [facility] Dr Mare Ferrari     Assessment & Plan Clinical Impression: Pt is a 74 y/o M with hx of cervical myelopathy & admitted on 08/07/20 for scheduled C3-6 Posterior fusion with decompression by Dr. Izora Ribas. PMH: atypical angina, PAD, carotid stenosis, HTN, HLD, COPD, Myasthenia gravis, hypothyroidism, CKD-III, OSA requiring nocturnal PAP therapy, OA, cervical DDD, lumbosacral spinal stenosis, chronic DOE 2/2 COPD   Patient currently requires mod with basic  self-care skills secondary to muscle weakness, impaired timing and sequencing and decreased coordination and decreased sitting balance, decreased standing balance, decreased postural control, decreased balance strategies and difficulty maintaining precautions.  Prior to hospitalization, patient could complete BADL/IADL  with modified independent -MIN A for footwear.  Patient will benefit from skilled intervention to decrease level of assist with basic self-care skills and increase independence with basic self-care skills prior to discharge home with care partner.  Anticipate patient will require 24 hour supervision and minimal physical assistance and follow up outpatient.  OT - End of Session Endurance Deficit: Yes OT Assessment Rehab Potential (ACUTE ONLY): Good OT Patient demonstrates impairments in the following area(s): Balance;Cognition;Motor;Safety;Sensory;Endurance OT Basic ADL's Functional Problem(s): Grooming;Bathing;Toileting;Dressing OT Transfers Functional Problem(s): Toilet;Tub/Shower OT Plan OT Intensity: Minimum of 1-2 x/day, 45 to 90 minutes OT Frequency: 5 out of 7 days OT Duration/Estimated Length of Stay: 7-10 OT Treatment/Interventions: Balance/vestibular training;Discharge planning;Pain management;Self Care/advanced ADL retraining;Therapeutic Activities;UE/LE Coordination activities;Visual/perceptual remediation/compensation;Therapeutic Exercise;Skin care/wound managment;Patient/family education;Functional mobility training;Disease mangement/prevention;Cognitive remediation/compensation;Community reintegration;DME/adaptive equipment instruction;Neuromuscular re-education;Psychosocial support;Splinting/orthotics;UE/LE Strength taining/ROM;Wheelchair propulsion/positioning OT Self Feeding Anticipated Outcome(s): S OT Basic Self-Care Anticipated Outcome(s): S UB; MIN A LB OT Toileting Anticipated Outcome(s): S OT Bathroom Transfers Anticipated Outcome(s): S OT Recommendation Patient destination: Home Follow Up Recommendations: Home health OT   OT Evaluation Precautions/Restrictions  Precautions Precautions: Cervical;Fall Precaution Booklet Issued: Yes (comment) Precaution Comments: no bending, lifting >10lbs, twisting, arching Required Braces or Orthoses: Cervical Brace Cervical  Brace: At all times;Hard collar Restrictions Other Position/Activity Restrictions: Pt stating today MD said he only needed to wear brace when walking/transfering and could take it off in chair. General Chart Reviewed: Yes Family/Caregiver Present: No Vital Signs  Pain Pain Assessment Pain Score: 0-No pain Home Living/Prior Functioning Home Living Family/patient expects to be discharged to:: Private residence Living Arrangements: Spouse/significant other Available Help at Discharge: Family, Available PRN/intermittently Type of Home: House Home Access: Stairs to enter Technical brewer of Steps: 3 Entrance Stairs-Rails: Left Home Layout: One level Bathroom Shower/Tub: Chiropodist: Handicapped height Bathroom Accessibility: Yes  Lives With: Spouse IADL History Leisure and Hobbies: outside Prior Function Driving: Yes Comments: 6 weeks ago pt was independent (going snow tubing) then began to need RW for mobility, 3 weeks ago pt required w/c for mobility Vision Baseline Vision/History: Wears glasses Patient Visual Report: No change from baseline Perception  Perception: Within Functional Limits Praxis Praxis: Intact Cognition Overall Cognitive Status: Within Functional Limits for tasks assessed Orientation Level: Person;Place;Situation Person: Oriented Place: Oriented Situation: Oriented Year: 2021 Month: December Day of Week: Correct Immediate Memory Recall: Sock;Blue;Bed Memory Recall Sock: Without Cue Memory Recall Blue: Without Cue Memory Recall Bed: Without Cue Safety/Judgment: Appears intact Sensation Sensation Light Touch: Appears Intact Coordination Gross Motor Movements are Fluid and Coordinated: Yes Fine Motor Movements are Fluid and Coordinated: No Motor  Motor Motor: Within Functional Limits  Trunk/Postural Assessment  Cervical Assessment Cervical Assessment: (P)  (Cervical precautions)  Balance Balance Balance Assessed:  Yes Dynamic Sitting Balance Dynamic Sitting - Level of Assistance: 5: Stand by assistance Dynamic Sitting Balance - Compensations: VC for decreaseing bending Dynamic Sitting - Balance Activities: Reaching for objects Static Standing Balance Static Standing - Balance Support: Left upper extremity supported Static Standing - Level of Assistance: 4: Min assist Static Standing - Comment/# of Minutes: dressing Extremity/Trunk Assessment RUE Assessment RUE Assessment: Exceptions to [facility] General Strength Comments: no formal MMT d/t C precautions; generalized weakness LUE Assessment LUE Assessment: Exceptions to [facility] General Strength Comments: no formal  MMT d/t C precautions; generalized weakness  Care Tool Care Tool Self Care Eating        Oral Care         Bathing   Body parts bathed by patient: Right arm;Left arm;Chest;Abdomen;Front perineal area;Buttocks;Right upper leg;Left upper leg;Face Body parts bathed by helper: Right lower leg;Left lower leg   Assist Level: Moderate Assistance - Patient 50 - 74%    Upper Body Dressing(including orthotics)   What is the patient wearing?: Pull over shirt   Assist Level: Supervision/Verbal cueing    Lower Body Dressing (excluding footwear)   What is the patient wearing?: Underwear/pull up;Pants Assist for lower body dressing: Maximal Assistance - Patient 25 - 49%    Putting on/Taking off footwear   What is the patient wearing?: Ted hose;Shoes Assist for footwear: Dependent - Patient 0%       Care Tool Toileting Toileting activity   Assist for toileting: Moderate Assistance - Patient 50 - 74%     Care Tool Bed Mobility Roll left and right activity        Sit to lying activity        Lying to sitting edge of bed activity         Care Tool Transfers Sit to stand transfer        Chair/bed transfer   Chair/bed transfer assist level: Minimal Assistance - Patient > 75%     Toilet transfer   Assist Level: Minimal Assistance  - Patient > 75%     Care Tool Cognition Expression of Ideas and Wants     Understanding Verbal and Non-Verbal Content     Memory/Recall Ability *first 3 days only      Refer to Care Plan for Long Term Goals  SHORT TERM GOAL WEEK 1 OT Short Term Goal 1 (Week 1): STG=LTG d/t ELOS  Recommendations for other services: Therapeutic Recreation  Outing/community reintegration   Skilled Therapeutic Intervention 1;1. Pt received in bed agreeable to OT. Pt edu re OT role/purpose, ELOS, CIR, and POC. Pt requesting to shower and completed from chest down. OT controlled water to not get incision or brace wet. See below for functional level of ADL. Pt and OT discuss PLOF and goals. Pt eager to work but get home soon. Pt very motivated and will do well at IPR level of care. Exited session with pt seated in bed, exit alarm on and call light in reach  ADL ADL Eating: Set up Grooming: Minimal assistance Where Assessed-Grooming: Standing at sink Upper Body Bathing: Supervision/safety Where Assessed-Upper Body Bathing: Shower Lower Body Bathing: Moderate assistance Where Assessed-Lower Body Bathing: Shower Upper Body Dressing: Supervision/safety Where Assessed-Upper Body Dressing: Standing at sink Lower Body Dressing: Moderate assistance Where Assessed-Lower Body Dressing: Sitting at sink;Standing at sink;Wheelchair Toileting: Moderate assistance Where Assessed-Toileting: Glass blower/designer: Psychiatric nurse Method: Counselling psychologist: Energy manager: Environmental education officer Method: Ambulating Mobility  Transfers Sit to Stand: Minimal Assistance - Patient > 75% Stand to Sit: Minimal Assistance - Patient > 75%   Discharge Criteria: Patient will be discharged from OT if patient refuses treatment 3 consecutive times without medical reason, if treatment goals not met, if there is a change in medical status, if patient  makes no progress towards goals or if patient is discharged from hospital.  The above assessment, treatment plan, treatment alternatives and goals were discussed and mutually agreed upon: by patient  Tonny Branch 08/13/2020, 8:02 AM

## 2020-08-13 NOTE — Progress Notes (Signed)
Physical Therapy Session Note  Patient Details  Name: Francisco [name] MRN: [id number] Date of Birth: October 23, 1945  Today's Date: 08/13/2020 PT Individual Time: 6195-0932 PT Individual Time Calculation (min): 34 min   Short Term Goals: Week 1:  PT Short Term Goal 1 (Week 1): pt to demonstrate CGA for supine<>sit PT Short Term Goal 2 (Week 1): pt to demonstrate CGA for transfers with LRAD PT Short Term Goal 3 (Week 1): pt to demonstrate gait with LRAD 50' CGA PT Short Term Goal 4 (Week 1): pt to demonstrate stairs one rail CGA  Skilled Therapeutic Interventions/Progress Updates:  Pt received sitting in recliner and agreeable to therapy session - already wearing cervical collar. RN present for medication administration. Sit>stand recliner>RW with min assist for balance - pt noted to push backs of legs into the seat for stability. Gait training ~4ft to w/c using RW with min assist for balance - demos narrow BOS and decreased  BLE step lengths with pt noted to have significant effort laterally stepping when turning to sit in chair. Transported to/from gym in w/c for time management and energy conservation. Sit>stand w/c>RW with min assist and again noted to push backs of legs into chair causing the w/c to tip backwards - educated pt on scooting hips towards front of chair prior to coming to stand to prevent this. Stand pivot to Nustep using RW with min assist for balance and again pt having narrow BOS with increased effort to step laterally during transfer. Performed B LE reciprocal movements on Nustep targeting strengthening and cardiopulmonary endurance against level 5 resistance for 74minutes totaling 275 steps - cuing for B LE alignment to avoid excessive hip adduction. Therapist provided pt a w/c cushion for increased pressure relief to improve upright, OOB activity tolerance as pt reports he was sitting in a transport chair the majority of the day at home so he could navigate around the house more  independently. (will need a longer cushion when one is available) L stand pivot Nustep>w/c using RW with min assist for balance. Transported back to room and pt reports need to use bathroom. L stand pivot w/c>toilet using UE support on grab bar with min assist for balance due to posterior lean and mod assist for LB clothing management. Pt left sitting on toilet with RN present to assume care of patient.  Therapy Documentation Precautions:  Precautions Precautions: Cervical, Fall Precaution Booklet Issued: Yes (comment) Precaution Comments: no bending, lifting >10lbs, twisting, arching Required Braces or Orthoses: Cervical Brace Cervical Brace: At all times, Hard collar Restrictions Weight Bearing Restrictions: No Other Position/Activity Restrictions: Pt stating today MD said he only needed to wear brace when walking/transfering and could take it off in chair.  Pain: Receiving medications upon therapist arrival but no complaints of pain throughout session.  Therapy/Group: Individual Therapy  Tawana Scale , PT, DPT, CSRS  08/13/2020, 12:48 PM

## 2020-08-13 NOTE — Progress Notes (Signed)
Patient Details  Name: Francisco [name] MRN: [id number] Date of Birth: Aug 21, 1946  Today's Date: 08/13/2020  Hospital Problems: Principal Problem:   Cervical myelopathy [facility])  Past Medical History:  Past Medical History:  Diagnosis Date  . Arthritis    lower left hip  . Atypical angina ([place])   . Bilateral hand numbness    from back surgery  . Bronchitis, chronic ([place])   . Cancer [facility])    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; Merkel cell cancer, and Basal cell cancer (twice; 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  . Carotid stenosis   . CKD (chronic kidney disease) stage 3, GFR 30-59 ml/min (HCC)   . 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 DDD (degenerative disc disease), cervical   . Hypercholesterolemia   . Hypertension   . Hypothyroidism    pt takes Levothyroxine daily  . Lumbosacral spinal stenosis   . Myasthenia gravis, adult form ([place])   . PAD (peripheral artery disease) ([place])   . Shortness of breath    Lung MD- Dr Darlin Coco  . Sleep apnea    do not use CPAP every night   Past Surgical History:  Past Surgical History:  Procedure Laterality Date  . ANTERIOR CERVICAL DECOMP/DISCECTOMY FUSION  07/18/2011   Procedure: ANTERIOR CERVICAL DECOMPRESSION/DISCECTOMY FUSION 2 LEVELS;  Surgeon: Cooper Render Pool;  Location: [place] [facility];  Service: Neurosurgery;  Laterality: N/A;  cervical five-six, cervical six-seven anterior cervical discectomy and fusion  . BACK SURGERY     in [place]     01/2020 Right, 04/2020 Left  . CARDIAC CATHETERIZATION     2005 at [facility], no stents  . CATARACT EXTRACTION W/PHACO Left 01/06/2020   Procedure: CATARACT EXTRACTION PHACO AND INTRAOCULAR LENS PLACEMENT (IOC) ISTENT INJ LEFT 3.81  00:33.3;  Surgeon: Eulogio Bear, MD;  Location: [place];  Service: Ophthalmology;  Laterality: Left;  . CATARACT EXTRACTION W/PHACO Right 02/03/2020   Procedure: CATARACT EXTRACTION PHACO AND INTRAOCULAR LENS  PLACEMENT ([place]) RIGHT ISTENT INJ;  Surgeon: Eulogio Bear, MD;  Location: [place];  Service: Ophthalmology;  Laterality: Right;  4.29 0:35.6  . COLONOSCOPY    . HERNIA REPAIR Left    inguinal hernia repair in 1985  . LUMBAR LAMINECTOMY/DECOMPRESSION MICRODISCECTOMY Left 02/24/2014   Procedure: LUMBAR LAMINECTOMY/DECOMPRESSION MICRODISCECTOMY LUMBAR THREE-FOUR, FOUR-FIVE, LEFT FIVE-SACRAL ONE ;  Surgeon: Charlie Pitter, MD;  Location: [place] [facility];  Service: Neurosurgery;  Laterality: Left;  LUMBAR LAMINECTOMY/DECOMPRESSION MICRODISCECTOMY LUMBAR THREE-FOUR, FOUR-FIVE, LEFT FIVE-SACRAL ONE   . POSTERIOR CERVICAL FUSION/FORAMINOTOMY N/A 08/07/2020   Procedure: C3-6 POSTERIOR FUSION WITH DECOMPRESSION;  Surgeon: Meade Maw, MD;  Location: [facility]S;  Service: Neurosurgery;  Laterality: N/A;  . PROSTATECTOMY  8/14   [facility] Dr Mare Ferrari    Social History:  reports that he quit smoking about 18 years ago. His smoking use included cigarettes. He has a 20.00 pack-year smoking history. He has never used smokeless tobacco. He reports current alcohol use. He reports that he does not use drugs.  Family / Support Systems Marital Status: Married How Long?: 26 years Patient Roles: Spouse, Parent Spouse/Significant Other: Francisco [name] (wife): [phone] Children: 2 adult daughters from 73st marriage; live in [place]. Pt wife has two children from previous marriage as well- 1 son in [place], and dtr in [place], [place]. Other Supports: None reported Anticipated Caregiver: Wife Ability/Limitations of Caregiver: None reported Caregiver Availability: 24/7 Family Dynamics: Pt lives  with his wife Francisco [name].  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 Education: college Read: Yes Write: Yes Employment Status: Employed Return to Work Plans: Pt intends to  return to work Public relations account executive Issues: Denies Guardian/Conservator: N/A   Abuse/Neglect Abuse/Neglect Assessment Can Be Completed: Yes Physical Abuse: Denies Verbal Abuse: Denies Sexual Abuse: Denies Exploitation of patient/patient's resources: Denies Self-Neglect: Denies  Emotional Status Pt's affect, behavior and adjustment status: Pt in good spirits at time of visit Recent Psychosocial Issues: Denies Psychiatric History: Denies Substance Abuse History: Denies; admits he quit smoking cigarettes 18 years ago due to health reasons; occassional wine.  Patient / Family Perceptions, Expectations & Goals Pt/Family understanding of illness & functional limitations: Pt and pt wife have general understanding of care needs Premorbid pt/family roles/activities: Independent Anticipated changes in roles/activities/participation: Assistance with some ADLs Pt/family expectations/goals: Pt goal is to get "leg strength and balance."  [employer]: None Premorbid Home Care/DME Agencies: None Transportation available at discharge: wife  Discharge Planning Living Arrangements: Spouse/significant other [place]: Spouse/significant other Type of Residence: Private residence Insurance underwriter Resources: Multimedia programmer (specify) ([place] Medicare) Financial Resources: Employment, Radio broadcast assistant Screen Referred: No Living Expenses: Own Money Management: Patient Does the patient have any problems obtaining your medications?: No Home Management: Pt and wife clean home; cleaning service eery 2 weeks; lawn service once a 1 week. Reports they did not prepare alot of meals and eat more frequently. Care Coordinator Barriers to Discharge: Decreased caregiver support, Lack of/limited family support Care Coordinator Anticipated Follow Up Needs: HH/OP Expected length of stay: 7-10 days  Clinical Impression SW met with pt in room while he was eating lunch  sitting in reclining chair. SW introduced self, explain role, and discussed discharge process. No HCPOA forms but intends to complete once discharged. 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 No VA medical benefits being used. DME: RW, w/c. Pt states he uses his w/c to move around room to room and to get into reclining chair. Pt aware SW will follow-up with his wife as well.   SW spoke with [name] [phone]) to introduce self, explain role, and discuss discharge process. SW informed there will be follow-up with updates after team conference on Tuesday.   Alyda Megna A Giovonni Poirier 08/13/2020, 12:48 PM

## 2020-08-13 NOTE — Progress Notes (Signed)
PHYSICAL MEDICINE & REHABILITATION PROGRESS NOTE   Subjective/Complaints:  Pt describes LBM yesterday but needs stool softener- will add Colace BID.   Little tired- didn't sleep well- up urinating all night and new place.  Also felt like bed vibrating for some reason.   Pain comes and goes- mainly in neck and R shoulder- burning, on fire, now 2-3/10 - but was ~71/0 last night.   Was told can have collar off when sitting up/laying down- I suggested wear to sleep, but understand otherwise.    ROS:  Pt denies SOB, abd pain, CP, N/V/C/D, and vision changes    Objective:   DG Cervical Spine 2 or 3 views  Result Date: 08/11/2020 CLINICAL DATA:  Cervical posterior fusion C3 through C6. EXAM: CERVICAL SPINE - 2-3 VIEW COMPARISON:  08/07/2020 FINDINGS: ACDF C5 through C7 previously performed. Recent posterior fusion with lateral mass screws and rods extending from C3 through C6. Hardware in satisfactory position. Posterior skin staples noted. No acute skeletal abnormality. IMPRESSION: Bilateral screw C3 through C6 without complication and rod fusion From previously placed anterior fusion C5 through C7 with hardware. Electronically Signed   By: Franchot Gallo M.D.   On: 08/11/2020 16:29   No results for input(s): WBC, HGB, HCT, PLT in the last 72 hours. Recent Labs    08/12/20 1002  NA 141  K 4.3  CL 105  CO2 26  GLUCOSE 109*  BUN 23  CREATININE 1.19  CALCIUM 9.2    Intake/Output Summary (Last 24 hours) at 08/13/2020 0917 Last data filed at 08/13/2020 0052 Gross per 24 hour  Intake 240 ml  Output 1375 ml  Net -1135 ml        Physical Exam: Vital Signs Blood pressure (!) 182/77, pulse 83, temperature (!) 97.3 F (36.3 C), resp. rate 16, height 5\' 10"  (1.778 m), SpO2 96 %.   Constitutional:  awake, alert, sitting up in bedside chair, appropriate, NAD  HENT: C collar in place- posterior incision looks great- staples intact, no drainage or  erythema Cardiovascular: RRR_ no JVD.  Pulmonary: CTA B/L- no W/R/R- good air movement Abdominal: Soft, NT, ND, (+)BS  Musculoskeletal:     Comments: TTP R shoulder, sensitive to touch and back of neck around incision.  Skin:    General: Skin is warm and dry.  Neurological: Ox3 Bilateral upper extremities: 5/5 proximal distal Right lower extremity: 5/5 proximal distal Left lower extremity: Hip flexion 4/5, knee extension 4+/5, dorsiflexion 3/5 Sensation diminished to light touch in bilateral hands and left foot  Psychiatric:   appropriate    Assessment/Plan: 1. Functional deficits which require 3+ hours per day of interdisciplinary therapy in a comprehensive inpatient rehab setting.  Physiatrist is providing close team supervision and 24 hour management of active medical problems listed below.  Physiatrist and rehab team continue to assess barriers to discharge/monitor patient progress toward functional and medical goals  Care Tool:  Bathing    Body parts bathed by patient: Right arm, Left arm, Chest, Abdomen, Front perineal area, Buttocks, Right upper leg, Left upper leg, Face   Body parts bathed by helper: Right lower leg, Left lower leg     Bathing assist Assist Level: Moderate Assistance - Patient 50 - 74%     Upper Body Dressing/Undressing Upper body dressing   What is the patient wearing?: Pull over shirt    Upper body assist Assist Level: Supervision/Verbal cueing    Lower Body Dressing/Undressing Lower body dressing  What is the patient wearing?: Underwear/pull up, Pants     Lower body assist Assist for lower body dressing: Maximal Assistance - Patient 25 - 49%     Toileting Toileting    Toileting assist Assist for toileting: Moderate Assistance - Patient 50 - 74% Assistive Device Comment: urinal   Transfers Chair/bed transfer  Transfers assist     Chair/bed transfer assist level: Minimal Assistance - Patient > 75%      Locomotion Ambulation   Ambulation assist              Walk 10 feet activity   Assist           Walk 50 feet activity   Assist           Walk 150 feet activity   Assist           Walk 10 feet on uneven surface  activity   Assist           Wheelchair     Assist               Wheelchair 50 feet with 2 turns activity    Assist            Wheelchair 150 feet activity     Assist          Blood pressure (!) 182/77, pulse 83, temperature (!) 97.3 F (36.3 C), resp. rate 16, height 5\' 10"  (1.778 m), SpO2 96 %.  Medical Problem List and Plan: 1.  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              -ELOS/Goals: 10-14 days/supervision/mod I             Admit to CIR 2.  Antithrombotics: -DVT/anticoagulation:  Pharmaceutical: Lovenox             -antiplatelet therapy: N/A 3. Pain Management: Now on tylenol prn as well as  Celebrex bid DC'd.             Continue gabapentin 300 mg tid, adjust as necessary             Oxycodone prn.  12/2- will add Lyrica 50 mg BID and con't gabapentin for now, for burning- will switch over to Lyrica if helpful.               Monitor with increased exertion 4. Mood: LCSW to follow for evaluation and support.              -antipsychotic agents: N/A 5. Neuropsych: This patient is capable of making decisions on his own behalf. 6. Skin/Wound Care: Monitor wound for healing.  7. Fluids/Electrolytes/Nutrition: Monitor I/Os.              CMP ordered for tomorrow a.m. 8. COPD/OSA: Uses oxygen 2 L at bedime per records.  9. CKD stage 3: Baseline SCr around 1.39-1.5 in the past couple of months. 1.39 on 11/18.              CMP ordered for tomorrow a.m. 10. MG: Has been stable on  Imuran 150 mg/day and Mestinon 60 mg bid.    12/2- stable- con't regimen  Regulate therapies if necessary 11. Anemia of chronic disease: Pre-op H/H- 10.4/32.7.              CBC ordered for tomorrow a.m. 12.  Hypertension: Monitor             Cozaar  12/2- BP 182/77 this AM, but otherwise has been 326Z systolic- wrong cuff size? Will monitor closely.              Monitor with increased mobility  13.  Leucocytosis: WBC 10.7 on 11/18.              CBC ordered for tomorrow a.m. 14. RLS: managed by ropinirole at bedtime.  15. Constipation  12/2- will add Colace 100 mg BID for now.   LOS: 1 days A FACE TO FACE EVALUATION WAS PERFORMED  Remi Rester 08/13/2020, 9:17 AM

## 2020-08-13 NOTE — Evaluation (Addendum)
Physical Therapy Assessment and Plan  Patient Details  Name: Francisco [name] MRN: [id number] Date of Birth: 1945/09/18  PT Diagnosis: Abnormal posture, Abnormality of gait, Difficulty walking and Impaired sensation Rehab Potential: Good ELOS: 7-10 days   Today's Date: 08/13/2020 PT Individual Time: 0900-1000 PT Individual Time Calculation (min): 60 min    Hospital Problem: Principal Problem:   Cervical myelopathy ([place])   Past Medical History:  Past Medical History:  Diagnosis Date  . Arthritis    lower left hip  . Atypical angina ([place])   . Bilateral hand numbness    from back surgery  . Bronchitis, chronic ([place])   . 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 Carotid stenosis   . CKD (chronic kidney disease) stage 3, GFR 30-59 ml/min (HCC)   . 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 DDD (degenerative disc disease), cervical   . Hypercholesterolemia   . Hypertension   . Hypothyroidism    pt takes Levothyroxine daily  . Lumbosacral spinal stenosis   . Myasthenia gravis, adult form ([place])   . PAD (peripheral artery disease) ([place])   . Shortness of breath    Lung MD- Dr Darlin Coco  . Sleep apnea    do not use CPAP every night   Past Surgical History:  Past Surgical History:  Procedure Laterality Date  . ANTERIOR CERVICAL DECOMP/DISCECTOMY FUSION  07/18/2011   Procedure: ANTERIOR CERVICAL DECOMPRESSION/DISCECTOMY FUSION 2 LEVELS;  Surgeon: Cooper Render Pool;  Location: [place] [facility];  Service: Neurosurgery;  Laterality: N/A;  cervical five-six, cervical six-seven anterior cervical discectomy and fusion  . BACK SURGERY     in [place]     01/2020 Right, 04/2020 Left  . CARDIAC CATHETERIZATION     2005 at [facility], no stents  . CATARACT EXTRACTION W/PHACO Left 01/06/2020   Procedure: CATARACT EXTRACTION PHACO AND INTRAOCULAR LENS PLACEMENT  (IOC) ISTENT INJ LEFT 3.81  00:33.3;  Surgeon: Eulogio Bear, MD;  Location: [place];  Service: Ophthalmology;  Laterality: Left;  . CATARACT EXTRACTION W/PHACO Right 02/03/2020   Procedure: CATARACT EXTRACTION PHACO AND INTRAOCULAR LENS PLACEMENT ([place]) RIGHT ISTENT INJ;  Surgeon: Eulogio Bear, MD;  Location: [place];  Service: Ophthalmology;  Laterality: Right;  4.29 0:35.6  . COLONOSCOPY    . HERNIA REPAIR Left    inguinal hernia repair in 1985  . LUMBAR LAMINECTOMY/DECOMPRESSION MICRODISCECTOMY Left 02/24/2014   Procedure: LUMBAR LAMINECTOMY/DECOMPRESSION MICRODISCECTOMY LUMBAR THREE-FOUR, FOUR-FIVE, LEFT FIVE-SACRAL ONE ;  Surgeon: Charlie Pitter, MD;  Location: [place] [facility] OR[facility];  Service: Neurosurgery;  Laterality: Left;  LUMBAR LAMINECTOMY/DECOMPRESSION MICRODISCECTOMY LUMBAR THREE-FOUR, FOUR-FIVE, LEFT FIVE-SACRAL ONE   . POSTERIOR CERVICAL FUSION/FORAMINOTOMY N/A 08/07/2020   Procedure: C3-6 POSTERIOR FUSION WITH DECOMPRESSION;  Surgeon: Meade Maw, MD;  Location: [facility] ORS;  Service: Neurosurgery;  Laterality: N/A;  . PROSTATECTOMY  8/14   [facility] Dr Mare Ferrari     Assessment & Plan Clinical Impression: Patient is a 74 y.o. year old male with history of HTN, MG, COPD, CKD-3,  SOB/OSA--non compliant with CPAP, prostate cancer s/p radical prostatectomy, CAS- plans for stent in the future, lumbar and cervical stenosis s/p ACDF C5-C7 who started having acute on chronic weakness with spasticity and neurogenic bladder 07/14/20 and MRI reveled moderate to severe spinal stenosis C3-C4 with hyperintense signal due to compressive changes. He  was evaluated by Dr. Cari Caraway and admitted to [facility] on 1126/21 for C3-C5 decompressive laminectomy with posterior lateral arthrodesis C3-C6. Post op to wear collar when OOB. Drain removed 11/29 and on lovenox for DVR prophylaxis. Therapy ongoing and patient limited by  BUE weakness, impaired standing balance, LLE weakness and  unsteady gait. CIR recommended due to functional decline.  Patient transferred to CIR on 08/12/2020 .   Patient currently requires min with mobility secondary to muscle weakness, decreased cardiorespiratoy endurance, and decreased sitting balance, decreased standing balance, decreased postural control and decreased balance strategies.  Prior to hospitalization, patient was modified independent  with mobility and lived with Spouse in a House home.  Home access is 3Stairs to enter.  Patient will benefit from skilled PT intervention to maximize safe functional mobility, minimize fall risk and decrease caregiver burden for planned discharge home with intermittent assist.  Anticipate patient will benefit from follow up [facility] at discharge.  PT - End of Session Activity Tolerance: Tolerates 30+ min activity with multiple rests Endurance Deficit: Yes PT Assessment Rehab Potential (ACUTE/IP ONLY): Good PT Patient demonstrates impairments in the following area(s): Balance;Pain;Skin Integrity;Endurance PT Transfers Functional Problem(s): Bed Mobility;Bed to Chair;Car;Furniture PT Locomotion Functional Problem(s): Ambulation;Stairs PT Plan PT Intensity: Minimum of 1-2 x/day ,45 to 90 minutes PT Frequency: 5 out of 7 days PT Duration Estimated Length of Stay: 7-10 days PT Treatment/Interventions: Ambulation/gait training;Balance/vestibular training;Cognitive remediation/compensation;Community reintegration;DME/adaptive equipment instruction;Disease management/prevention;Discharge planning;Functional mobility training;Psychosocial support;Therapeutic Activities;Visual/perceptual remediation/compensation;Wheelchair propulsion/positioning;Skin care/wound management;Therapeutic Exercise;Pain management;UE/LE Strength taining/ROM;UE/LE Coordination activities;Stair training;Patient/family education PT Transfers Anticipated Outcome(s): supervision PT Locomotion Anticipated Outcome(s): supervision PT  Recommendation Equipment Recommended: To be determined Equipment Details: has WC, cane and walker  DC plan for home with outpatient PT   PT Evaluation Precautions/Restrictions Precautions Precautions: Cervical;Fall Precaution Comments: no bending, lifting >10lbs, twisting, arching Required Braces or Orthoses: Cervical Brace Cervical Brace: At all times;Hard collar Restrictions Weight Bearing Restrictions: No Other Position/Activity Restrictions: Pt stating today MD said he only needed to wear brace when walking/transfering and could take it off in chair. General   Vital Signs Pain Pain Assessment Pain Scale: 0-10 Pain Score: 3  Pain Type: Neuropathic pain Pain Location: Shoulder Pain Orientation: Right Pain Descriptors / Indicators: Stabbing;Burning Pain Intervention(s): RN made aware;Relaxation Home Living/Prior Functioning Home Living Available Help at Discharge: Family;Available PRN/intermittently Type of Home: House Home Access: Stairs to enter [employer] of Steps: 3 Entrance Stairs-Rails: Left Home Layout: One level Bathroom Shower/Tub: Chiropodist: Handicapped height Bathroom Accessibility: Yes  Lives With: Spouse Prior Function Level of Independence: Independent with gait;Independent with basic ADLs;Independent with homemaking with ambulation  Able to Take Stairs?: Yes Driving: Yes Comments: 6 weeks ago pt was independent (going snow tubing) then began to need RW for mobility, 3 weeks ago pt required w/c for mobility Vision/Perception  Vision - Assessment Additional Comments: cateract surgery in last 6 months and has greatly improved vision since this Perception Perception: Within Functional Limits Praxis Praxis: Intact  Cognition Overall Cognitive Status: Within Functional Limits for tasks assessed Arousal/Alertness: Awake/alert Orientation Level: Oriented X4 Attention: Focused;Sustained Focused Attention: Appears  intact Sustained Attention: Appears intact Awareness: Appears intact Problem Solving: Appears intact Executive Function: Reasoning;Sequencing Reasoning: Appears intact Sequencing: Appears intact Safety/Judgment: Appears intact Sensation Sensation Light Touch: Appears Intact Additional Comments: pt does report intermittent slight tingling in B hands/fingers and L foot Coordination Gross Motor Movements are Fluid and Coordinated: Yes Fine Motor Movements are Fluid and Coordinated: No Motor  Motor Motor: Within Functional  Limits   Trunk/Postural Assessment  Cervical Assessment Cervical Assessment: Exceptions to [facility] (in aspen brace) Thoracic Assessment Thoracic Assessment: Exceptions to [facility] (kyphotic) Lumbar Assessment Lumbar Assessment: Exceptions to [facility] (posterior pelvic) Postural Control Postural Control: Deficits on evaluation  Balance Balance Balance Assessed: Yes Dynamic Sitting Balance Dynamic Sitting - Level of Assistance: 5: Stand by assistance Dynamic Sitting Balance - Compensations: VC for decreaseing bending Dynamic Sitting - Balance Activities: Reaching for objects Sitting balance - Comments: BUE support & CGA/min assist for dynamic sitting balance EOB Static Standing Balance Static Standing - Balance Support: Left upper extremity supported Static Standing - Level of Assistance: 4: Min assist Static Standing - Comment/# of Minutes: dressing Extremity Assessment  RUE Assessment RUE Assessment: Exceptions to [facility] General Strength Comments: no formal MMT d/t C precautions; generalized weakness LUE Assessment LUE Assessment: Exceptions to [facility] General Strength Comments: no formal MMT d/t C precautions; generalized weakness RLE Assessment RLE Assessment: Exceptions to [facility] Passive Range of Motion (PROM) Comments: WFL Active Range of Motion (AROM) Comments: WFL General Strength Comments: hip flexion 4/5; all others 5/5 LLE Assessment LLE Assessment: Exceptions to  [facility] Passive Range of Motion (PROM) Comments: WFL Active Range of Motion (AROM) Comments: WFL General Strength Comments: hip flexion 4/5; all others 5/5  Care Tool Care Tool Bed Mobility Roll left and right activity   Roll left and right assist level: Minimal Assistance - Patient > 75%    Sit to lying activity   Sit to lying assist level: Minimal Assistance - Patient > 75%    Lying to sitting edge of bed activity   Lying to sitting edge of bed assist level: Minimal Assistance - Patient > 75%     Care Tool Transfers Sit to stand transfer   Sit to stand assist level: Minimal Assistance - Patient > 75%    Chair/bed transfer   Chair/bed transfer assist level: Minimal Assistance - Patient > 75%     Toilet transfer   Assist Level: Minimal Assistance - Patient > 75%    Car transfer   Car transfer assist level: Minimal Assistance - Patient > 75%      Care Tool Locomotion Ambulation   Assist level: Minimal Assistance - Patient > 75% Assistive device: Walker-rolling Max distance: 30  Walk 10 feet activity   Assist level: Minimal Assistance - Patient > 75% Assistive device: Walker-rolling   Walk 50 feet with 2 turns activity Walk 50 feet with 2 turns activity did not occur: Safety/medical concerns      Walk 150 feet activity Walk 150 feet activity did not occur: Safety/medical concerns      Walk 10 feet on uneven surfaces activity Walk 10 feet on uneven surfaces activity did not occur: Safety/medical concerns      Stairs   Assist level: Minimal Assistance - Patient > 75%   Max number of stairs: 4  Walk up/down 1 step activity   Walk up/down 1 step (curb) assist level: Minimal Assistance - Patient > 75% Walk up/down 1 step or curb assistive device: 2 hand rails    Walk up/down 4 steps activity Walk up/down 4 steps assist level: Minimal Assistance - Patient > 75% Walk up/down 4 steps assistive device: 2 hand rails  Walk up/down 12 steps activity Walk up/down 12 steps  activity did not occur: Safety/medical concerns      Pick up small objects from floor Pick up small object from the floor (from standing position) activity did not occur: Safety/medical concerns  Wheelchair Will patient use wheelchair at discharge?: No   Wheelchair activity did not occur: N/A      Wheel 50 feet with 2 turns activity Wheelchair 50 feet with 2 turns activity did not occur: N/A    Wheel 150 feet activity Wheelchair 150 feet activity did not occur: N/A      Refer to Care Plan for [place] 1 PT Short Term Goal 1 (Week 1): pt to demonstrate CGA for supine<>sit PT Short Term Goal 2 (Week 1): pt to demonstrate CGA for transfers with LRAD PT Short Term Goal 3 (Week 1): pt to demonstrate gait with LRAD 50' CGA PT Short Term Goal 4 (Week 1): pt to demonstrate stairs one rail CGA  Recommendations for other services: None   Skilled Therapeutic Intervention  Evaluation completed (see details above and below) with education on PT POC and goals and individual treatment initiated with focus on  transfer training, gait training, stair training, balance training, safety awareness, call light use. pt received in recliner with Aspen collar in place and agreeable to therapy. Pt directed in Sit to stand from recliner min A to Rolling walker and gait training with Rolling walker for 30' min A for safety. Pt taken to ortho gym in [facility] total A for time, directed in car transfer with Rolling walker min A to get out of car CGA to get in from estimated home car height, then directed in ascending/descending ramp with Rolling walker min A for stability. WC total A to gym for time. Pt directed in ascending/ descending x4 stairs with 2 handrails min A for stability with reciprocal step pattern to ascend and step to pattern to descend, VC for safety. Pt directed in additional gait training with Rolling walker for 30' x2 min A for stability with slight LLE instability with  weight bearing and pt reports this is "new" and did not have this issue prior to recent decline, no LOB noted but worsened with fatigue. Pt returned to room in [facility], total A for energy. Pt directed in TUG test with Rolling walker with time of 1 min 4 sec which is indicative of high fall risk. Pt returned to recliner at end of session with Rolling walker min A, alarm belt set, All needs in reach and in good condition. Call light in hand.    Mobility Transfers Transfers: Risk manager;Sit to Stand;Stand to Sit Sit to Stand: Minimal Assistance - Patient > 75% Stand to Sit: Minimal Assistance - Patient > 75% Stand Pivot Transfers: Minimal Assistance - Patient > 75% Stand Pivot Transfer Details: Verbal cues for precautions/safety;Verbal cues for safe use of DME/AE;Verbal cues for sequencing Transfer (Assistive device): Rolling walker Locomotion  Gait Ambulation: Yes Gait Assistance: Minimal Assistance - Patient > 75% Gait Distance (Feet): 30 Feet Assistive device: Rolling walker Gait Assistance Details: Verbal cues for sequencing;Verbal cues for precautions/safety;Verbal cues for safe use of DME/AE;Verbal cues for technique Gait Gait: Yes Gait Pattern: Impaired Gait Pattern: Decreased step length - right;Decreased step length - left;Trunk flexed;Poor foot clearance - left Gait velocity: decreased Stairs / Additional Locomotion Stairs: Yes Stairs Assistance: Minimal Assistance - Patient > 75% Stair Management Technique: Two rails Number of Stairs: 4 Ramp: Minimal Assistance - Patient >75% Curb: Minimal Assistance - Patient >75% Wheelchair Mobility Wheelchair Mobility: No   Discharge Criteria: Patient will be discharged from PT if patient refuses treatment 3 consecutive times without medical reason, if treatment goals not met, if there is  a change in medical status, if patient makes no progress towards goals or if patient is discharged from hospital.  The above assessment, treatment  plan, treatment alternatives and goals were discussed and mutually agreed upon: by patient  Junie Panning 08/13/2020, 11:56 AM

## 2020-08-13 NOTE — Progress Notes (Signed)
Occupational Therapy Session Note  Patient Details  Name: Francisco [name] MRN: [id number] Date of Birth: 05/15/1946  Today's Date: 08/13/2020 OT Individual Time: 1300-1345 OT Individual Time Calculation (min): 45 min    Short Term Goals: Week 1:     Skilled Therapeutic Interventions/Progress Updates:    OT intervention initiated following consult with evaluating OTR regarding POC and evaluation.Pt resting in recliner upon arrival. OT intervention with focus on sit<>stand, functional amb with RW, bed mobility on standard bed, standing balance, and safety awareness to increase independence with BADLs. Sit<>stand with CGA. Functional amb approx 10' with CGA. Pt educated on shower transfers. Pt amb with RW in ADL apartment with pt commenting that he "doesn't like" walking on carpet.  Pt states it is "harder." Pt practiced sit<>supine in standard bed with supervision. Discussed home safety. Pt returned to room and amb with RW to recliner. Pt remained in recliner with all needs within reach and belt alarm activated.   Therapy Documentation Precautions:  Precautions Precautions: Cervical, Fall Precaution Booklet Issued: Yes (comment) Precaution Comments: no bending, lifting >10lbs, twisting, arching Required Braces or Orthoses: Cervical Brace Cervical Brace: At all times, Hard collar Restrictions Weight Bearing Restrictions: No Other Position/Activity Restrictions: Pt stating today MD said he only needed to wear brace when walking/transfering and could take it off in chair. Pain: Pt denies pain  Therapy/Group: Individual Therapy  [name] 08/13/2020, 2:32 PM

## 2020-08-14 ENCOUNTER — Inpatient Hospital Stay (HOSPITAL_COMMUNITY): Payer: Medicare Other | Admitting: Occupational Therapy

## 2020-08-14 ENCOUNTER — Inpatient Hospital Stay (HOSPITAL_COMMUNITY): Payer: Medicare Other | Admitting: Physical Therapy

## 2020-08-14 ENCOUNTER — Inpatient Hospital Stay (HOSPITAL_COMMUNITY): Payer: Medicare Other

## 2020-08-14 MED ORDER — NAPHAZOLINE-GLYCERIN 0.012-0.2 % OP SOLN
1.0000 [drp] | Freq: Three times a day (TID) | OPHTHALMIC | Status: DC
  Administered 2020-08-14 – 2020-08-15 (×4): 2 [drp] via OPHTHALMIC
  Administered 2020-08-15: 1 [drp] via OPHTHALMIC
  Administered 2020-08-15 – 2020-08-17 (×7): 2 [drp] via OPHTHALMIC
  Administered 2020-08-17: 1 [drp] via OPHTHALMIC
  Administered 2020-08-17 (×2): 2 [drp] via OPHTHALMIC
  Administered 2020-08-18 – 2020-08-20 (×9): 1 [drp] via OPHTHALMIC
  Filled 2020-08-14: qty 15

## 2020-08-14 MED ORDER — INFLUENZA VAC A&B SA ADJ QUAD 0.5 ML IM PRSY
0.5000 mL | PREFILLED_SYRINGE | INTRAMUSCULAR | Status: AC
  Administered 2020-08-15: 0.5 mL via INTRAMUSCULAR
  Filled 2020-08-14: qty 0.5

## 2020-08-14 NOTE — Progress Notes (Signed)
Physical Therapy Session Note  Patient Details  Name: Francisco [name] MRN: [id number] Date of Birth: 02/17/1946  Today's Date: 08/14/2020 PT Individual Time: 1400-1445 PT Individual Time Calculation (min): 45 min   Short Term Goals: Week 1:  PT Short Term Goal 1 (Week 1): pt to demonstrate CGA for supine<>sit PT Short Term Goal 2 (Week 1): pt to demonstrate CGA for transfers with LRAD PT Short Term Goal 3 (Week 1): pt to demonstrate gait with LRAD 50' CGA PT Short Term Goal 4 (Week 1): pt to demonstrate stairs one rail CGA  Skilled Therapeutic Interventions/Progress Updates:    Patient received sitting up in recliner, agreeable to PT. He reports 7/10 pain in R lateral neck/shoulder, premedicated. He relates this pain to his sx. PT providing repositioning, distractions and rest breaks to assist in pain management. Hard C-collar on AAT during therapy session. Patient ambulating into bathroom with RW and CGA. Noted poor L knee control in stance with increased reliance on B UE on walker. Patient able to void in toilet. CGA provided for postural support while patient completed clothing management in standing. Patient able to stand at sink for hand hygiene with CGA. PT propelling patient in wc to therapy gym for time management and energy conservation. Patient ambulating 42ft with RW and MinA. Increased emphasis on adequate L step length, L knee extension in stance and proper placement of L foot through swing. Patient does maintain WBOS when ambulating to assist with maintaining balance. Toe taps onto 6" step with B UE support + CGA for improved L hip flexor activation, 3x20. PT educated patient negotiating stairs laterally to utilized B UE on L HR. Patient able to demonstrate 1 step x3 with this technique. Patient then ambulating 4 steps with B HR use and MinA, step-to pattern. Patient ambulating back to wc ~44ft with 3# ankle weight on L LE for improved proprioceptive input. Moderate improvement in  correct placement of L LE noted. Patient returning to room in wc, ambulating back to recliner with RW and MinA. Seatbelt alarm on, call light within reach.   Therapy Documentation Precautions:  Precautions Precautions: Cervical, Fall Precaution Booklet Issued: Yes (comment) Precaution Comments: no bending, lifting >10lbs, twisting, arching Required Braces or Orthoses: Cervical Brace Cervical Brace: At all times, Hard collar Restrictions Weight Bearing Restrictions: No Other Position/Activity Restrictions: Pt stating today MD said he only needed to wear brace when walking/transfering and could take it off in chair.    Therapy/Group: Individual Therapy  Karoline Caldwell, PT, DPT, CBIS  08/14/2020, 7:57 AM

## 2020-08-14 NOTE — Progress Notes (Signed)
Occupational Therapy Session Note  Patient Details  Name: Francisco [name] MRN: [id number] Date of Birth: June 20, 1946  Today's Date: 08/14/2020 OT Individual Time: 0940-1020 OT Individual Time Calculation (min): 40 min    Short Term Goals: Week 1:  OT Short Term Goal 1 (Week 1): STG=LTG d/t ELOS  Skilled Therapeutic Interventions/Progress Updates:    Pt seen this session to focus on mobility skills of sit to stand (CGA) and ambulation in room (min a) with Rw during standing oral care, seated dressing. Pt then sat in arm chair to work on active knee extension isometric holds engaging his core, resisted knee add with pillow squeezes and resisted hip abd with theraband.  theraband for tricep extensions. Pt provided with theraputty to work on sensory stim for finger tips due to numbness.  Transferred to recliner with seat pad alarm on and all needs met.    Therapy Documentation Precautions:  Precautions Precautions: Cervical, Fall Precaution Booklet Issued: Yes (comment) Precaution Comments: no bending, lifting >10lbs, twisting, arching Required Braces or Orthoses: Cervical Brace Cervical Brace: At all times, Hard collar Restrictions Weight Bearing Restrictions: No Other Position/Activity Restrictions: Pt stating today MD said he only needed to wear brace when walking/transfering and could take it off in chair. General:   Vital Signs:  Pain: Pain Assessment Pain Scale: 0-10 Pain Score: 4  Pain Location: Shoulder Pain Orientation: Right Pain Descriptors / Indicators: Stabbing Pain Intervention(s): Medication (See eMAR) ADL: ADL Eating: Set up Grooming: Minimal assistance Where Assessed-Grooming: Standing at sink Upper Body Bathing: Supervision/safety Where Assessed-Upper Body Bathing: Shower Lower Body Bathing: Moderate assistance Where Assessed-Lower Body Bathing: Shower Upper Body Dressing: Supervision/safety Where Assessed-Upper Body Dressing: Standing at sink Lower  Body Dressing: Moderate assistance Where Assessed-Lower Body Dressing: Sitting at sink, Standing at sink, Wheelchair Toileting: Moderate assistance Where Assessed-Toileting: Glass blower/designer: Psychiatric nurse Method: Counselling psychologist: Energy manager: Environmental education officer Method: Ambulating   Therapy/Group: Individual Therapy  Benson 08/14/2020, 1:07 PM

## 2020-08-14 NOTE — Progress Notes (Signed)
[place] [facility] PHYSICAL MEDICINE & REHABILITATION PROGRESS NOTE   Subjective/Complaints:  Pt reports he's wondering if can take his cervical collar off- we discussed this at length yesterday=- can go without while in bed, while awake, but not while asleep or in therapy/shower/up.   Wants pain meds- is due and had accident with urinal- had to get bed changed  No side effects so far from addition of Lyrica.   ROS:  Pt denies SOB, abd pain, CP, N/V/C/D, and vision changes   Objective:   No results found. No results for input(s): WBC, HGB, HCT, PLT in the last 72 hours. Recent Labs    08/12/20 1002  NA 141  K 4.3  CL 105  CO2 26  GLUCOSE 109*  BUN 23  CREATININE 1.19  CALCIUM 9.2    Intake/Output Summary (Last 24 hours) at 08/14/2020 1311 Last data filed at 08/14/2020 0905 Gross per 24 hour  Intake 595 ml  Output 1425 ml  Net -830 ml        Physical Exam: Vital Signs Blood pressure (!) 142/67, pulse 72, temperature 98.2 F (36.8 C), resp. rate 18, height 5\' 10"  (1.778 m), SpO2 94 %.   Constitutional: awake, sitting up in bedside chair, appropriate, NAD HENT: cervical collar in place- C/D/I Cardiovascular: RRR Pulmonary: CTA B/L- no W/R/R- good air movement Abdominal: Soft, NT, ND, (+)BS  Musculoskeletal:     Comments: TTP R shoulder, sensitive to touch and back of neck around incision. TTP over R shoulder as well-  Skin:    General: Skin is warm and dry.  Neurological: Ox3 Bilateral upper extremities: 5/5 proximal distal Right lower extremity: 5/5 proximal distal Left lower extremity: Hip flexion 4/5, knee extension 4+/5, dorsiflexion 3/5 Sensation diminished to light touch in bilateral hands and left foot  Psychiatric:   appropriate    Assessment/Plan: 1. Functional deficits which require 3+ hours per day of interdisciplinary therapy in a comprehensive inpatient rehab setting.  Physiatrist is providing close team supervision and 24 hour management of  active medical problems listed below.  Physiatrist and rehab team continue to assess barriers to discharge/monitor patient progress toward functional and medical goals  Care Tool:  Bathing    Body parts bathed by patient: Right arm, Left arm, Chest, Abdomen, Front perineal area, Buttocks, Right upper leg, Left upper leg, Face   Body parts bathed by helper: Right lower leg, Left lower leg     Bathing assist Assist Level: Moderate Assistance - Patient 50 - 74%     Upper Body Dressing/Undressing Upper body dressing   What is the patient wearing?: Pull over shirt    Upper body assist Assist Level: Supervision/Verbal cueing    Lower Body Dressing/Undressing Lower body dressing      What is the patient wearing?: Underwear/pull up, Pants     Lower body assist Assist for lower body dressing: Maximal Assistance - Patient 25 - 49%     Toileting Toileting    Toileting assist Assist for toileting: Moderate Assistance - Patient 50 - 74% Assistive Device Comment: urinal   Transfers Chair/bed transfer  Transfers assist     Chair/bed transfer assist level: Minimal Assistance - Patient > 75% Chair/bed transfer assistive device: Programmer, multimedia   Ambulation assist      Assist level: Minimal Assistance - Patient > 75% Assistive device: Walker-rolling Max distance: 33ft   Walk 10 feet activity   Assist     Assist level: Minimal Assistance - Patient > 75%  Assistive device: Walker-rolling   Walk 50 feet activity   Assist Walk 50 feet with 2 turns activity did not occur: Safety/medical concerns         Walk 150 feet activity   Assist Walk 150 feet activity did not occur: Safety/medical concerns         Walk 10 feet on uneven surface  activity   Assist Walk 10 feet on uneven surfaces activity did not occur: Safety/medical concerns         Wheelchair     Assist Will patient use wheelchair at discharge?: No   Wheelchair  activity did not occur: N/A         Wheelchair 50 feet with 2 turns activity    Assist    Wheelchair 50 feet with 2 turns activity did not occur: N/A       Wheelchair 150 feet activity     Assist  Wheelchair 150 feet activity did not occur: N/A       Blood pressure (!) 142/67, pulse 72, temperature 98.2 F (36.8 C), resp. rate 18, height 5\' 10"  (1.778 m), SpO2 94 %.  Medical Problem List and Plan: 1.  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   12/3- reverified this to pt- about cervical collar as above.              -ELOS/Goals: 10-14 days/supervision/mod I             Admit to CIR 2.  Antithrombotics: -DVT/anticoagulation:  Pharmaceutical: Lovenox             -antiplatelet therapy: N/A 3. Pain Management: Now on tylenol prn as well as  Celebrex bid DC'd.             Continue gabapentin 300 mg tid, adjust as necessary             Oxycodone prn.  12/2- will add Lyrica 50 mg BID and con't gabapentin for now, for burning- will switch over to Lyrica if helpful.    12/3- no side effectsso far, but hasn't helped yet- con't Lyrica.              Monitor with increased exertion 4. Mood: LCSW to follow for evaluation and support.              -antipsychotic agents: N/A 5. Neuropsych: This patient is capable of making decisions on his own behalf. 6. Skin/Wound Care: Monitor wound for healing.  7. Fluids/Electrolytes/Nutrition: Monitor I/Os.              CMP ordered for tomorrow a.m. 8. COPD/OSA: Uses oxygen 2 L at bedime per records.  9. CKD stage 3: Baseline SCr around 1.39-1.5 in the past couple of months. 1.39 on 11/18.              CMP ordered for tomorrow a.m. 10. MG: Has been stable on Imuran 150 mg/day and Mestinon 60 mg bid.    12/2- stable- con't  regimen             Regulate therapies if necessary 11. Anemia of chronic disease: Pre-op H/H- 10.4/32.7.              CBC ordered for tomorrow a.m. 12.  Hypertension: Monitor  Cozaar  12/2- BP 182/77 this AM, but otherwise has been 932T systolic- wrong cuff size? Will monitor closely.   12/3- BP 140s/67- con't regimen- usually lower             Monitor with increased mobility  13.  Leucocytosis: WBC 10.7 on 11/18.              CBC ordered for tomorrow a.m. 14. RLS: managed by ropinirole at bedtime.  15. Constipation  12/2- will add Colace 100 mg BID for now.   12/3- pt reports OK   LOS: 2 days A FACE TO FACE EVALUATION WAS PERFORMED  Demarko Zeimet 08/14/2020, 1:11 PM

## 2020-08-14 NOTE — IPOC Note (Signed)
Overall Plan of Care [facility]) Patient Details Name: Francisco [name] MRN: [id number] DOB: 1946-02-28  Admitting Diagnosis: Cervical myelopathy [facility])  Hospital Problems: Principal Problem:   Cervical myelopathy ([place])     Functional Problem List: Nursing Edema, Medication Management, Pain, Safety, Sensory, Bowel  PT Balance, Pain, Skin Integrity, Endurance  OT Balance, Cognition, Motor, Safety, Sensory, Endurance  SLP    TR         Basic ADL's: OT Grooming, Bathing, Toileting, Dressing     Advanced  ADL's: OT       Transfers: PT Bed Mobility, Bed to Chair, Car, Manufacturing systems engineer, Metallurgist: PT Ambulation, Stairs     Additional Impairments: OT    SLP        TR      Anticipated Outcomes Item Anticipated Outcome  Self Feeding S  Swallowing      Basic self-care  S UB; MIN A LB  Toileting  S   Bathroom Transfers S  Bowel/Bladder  Patient will manage bowel and bladder with mod I assist  Transfers  supervision  Locomotion  supervision  Communication     Cognition     Pain  Patient will manage pain at or below level 4  Safety/Judgment  Patient will remain free of falls with injury while on rehab   Therapy Plan: PT Intensity: Minimum of 1-2 x/day ,45 to 90 minutes PT Frequency: 5 out of 7 days PT Duration Estimated Length of Stay: 7-10 days OT Intensity: Minimum of 1-2 x/day, 45 to 90 minutes OT Frequency: 5 out of 7 days OT Duration/Estimated Length of Stay: 7-10     Due to the current state of emergency, patients may not be receiving their 3-hours of Medicare-mandated therapy.   Team Interventions: Nursing Interventions Patient/Family Education, Bowel Management, Disease Management/Prevention, Pain Management, Medication Management  PT interventions Ambulation/gait training, Balance/vestibular training, Cognitive remediation/compensation, Community reintegration, DME/adaptive equipment instruction, Disease management/prevention,  Discharge planning, Functional mobility training, Psychosocial support, Therapeutic Activities, Visual/perceptual remediation/compensation, Wheelchair propulsion/positioning, Skin care/wound management, Therapeutic Exercise, Pain management, UE/LE Strength taining/ROM, UE/LE Coordination activities, Stair training, Patient/family education  OT Interventions Balance/vestibular training, Discharge planning, Pain management, Self Care/advanced ADL retraining, Therapeutic Activities, UE/LE Coordination activities, Visual/perceptual remediation/compensation, Therapeutic Exercise, Skin care/wound managment, Patient/family education, Functional mobility training, Disease mangement/prevention, Cognitive remediation/compensation, Academic librarian, Engineer, drilling, Neuromuscular re-education, Psychosocial support, Splinting/orthotics, UE/LE Strength taining/ROM, Wheelchair propulsion/positioning  SLP Interventions    TR Interventions    SW/CM Interventions Discharge Planning, Psychosocial Support, Patient/Family Education   Barriers to Discharge MD  Medical stability, Home enviroment access/loayout, Wound care, Lack of/limited family support and Weight bearing restrictions  Nursing      PT      OT      SLP      SW Decreased caregiver support, Lack of/limited family support     Team Discharge Planning: Destination: PT-  ,OT- Home , SLP-  Projected Follow-up: PT- , OT-  Home health OT, SLP-  Projected Equipment Needs: PT-To be determined, OT-  , SLP-  Equipment Details: PT-has WC, cane and walker, OT-  Patient/family involved in discharge planning: PT- Patient,  OT-Patient, Family member/caregiver, SLP-   MD ELOS: 7-10 days Medical Rehab Prognosis:  Good Assessment: Pt is a 74 yr old male with cervical myelopathy, pain and nerve pain; COPD/OSA, CKD stage III, myasthenia gravis, and RLS, and constipation.   Goals supervision by d/c.     See Team Conference Notes for  weekly updates to the plan of care

## 2020-08-14 NOTE — Progress Notes (Signed)
Physical Therapy Session Note  Patient Details  Name: Francisco [name] MRN: [id number] Date of Birth: 07-31-1946  Today's Date: 08/14/2020 PT Individual Time: 1117-1200 PT Individual Time Calculation (min): 43 min   Short Term Goals: Week 1:  PT Short Term Goal 1 (Week 1): pt to demonstrate CGA for supine<>sit PT Short Term Goal 2 (Week 1): pt to demonstrate CGA for transfers with LRAD PT Short Term Goal 3 (Week 1): pt to demonstrate gait with LRAD 50' CGA PT Short Term Goal 4 (Week 1): pt to demonstrate stairs one rail CGA  Skilled Therapeutic Interventions/Progress Updates:    pt received in [facility] and agreeable to therapy post OT session. Pt directed in gait training with Rolling walker for 30' x3 CGA with slight regression to min A at end distance 2/2 fatigue. Pt directed in seated Bilateral lower extremity  strengthening exercises 2# marching, LAQ 2x15 each and 2x15 hip adduction and abduction as pt reported he was fatigued post OT session. Pt then returned to room at end of session in [facility] for energy, gait to recliner with Rolling walker CGA and left in recliner with alarm set, All needs in reach and in good condition. Call light in hand.  And wife present.     Therapy Documentation Precautions:  Precautions Precautions: Cervical, Fall Precaution Booklet Issued: Yes (comment) Precaution Comments: no bending, lifting >10lbs, twisting, arching Required Braces or Orthoses: Cervical Brace Cervical Brace: At all times, Hard collar Restrictions Weight Bearing Restrictions: No Other Position/Activity Restrictions: Pt stating today MD said he only needed to wear brace when walking/transfering and could take it off in chair. General:   Vital Signs:  Pain: Pain Assessment Pain Scale: 0-10 Pain Score: 9  Pain Location: Shoulder Pain Orientation: Right Pain Intervention(s): Medication (See eMAR) Mobility:   Locomotion :    Trunk/Postural Assessment :    Balance:   Exercises:    Other Treatments:      Therapy/Group: Individual Therapy  Junie Panning 08/14/2020, 3:06 PM

## 2020-08-14 NOTE — Progress Notes (Signed)
Occupational Therapy Session Note  Patient Details  Name: Francisco [name] MRN: [id number] Date of Birth: [birth year]  Today's Date: 08/14/2020 OT Individual Time: 1040-1115 OT Individual Time Calculation (min): 35 min    Short Term Goals: Week 1:  OT Short Term Goal 1 (Week 1): STG=LTG d/t ELOS  Skilled Therapeutic Interventions/Progress Updates:    Treatment session with focus on functional mobility, transfers, dynamic standing balance, and fine motor control.  Pt received upright in recliner on the phone.  Pt agreeable to therapy session. Pt reports need to toilet.  Pt completed sit > stand from recliner with min assist due to low seat height, requiring facilitation for weight shift.  Pt ambulated to toilet with RW with CGA and completed clothing management with CGA.  Pt completed toileting with supervision and sit > stand from toilet with grab bar with supervision.  Engaged in dynamic standing activity at high-low table in Dayroom with focus on standing balance during table top task.  Therapist noting Lt knee instability and frequent weight shift to RLE to offweight LLE.  Pt overcompensating with weight shift off of LLE, therefore requiring min facilitation for weight shifting and for standing balance.  Noted some motor impersistence in LUE with table top task.  Pt able to complete sit > stand throughout session with CGA.  Pt passed off to PT for next therapy session.  Therapy Documentation Precautions:  Precautions Precautions: Cervical, Fall Precaution Booklet Issued: Yes (comment) Precaution Comments: no bending, lifting >10lbs, twisting, arching Required Braces or Orthoses: Cervical Brace Cervical Brace: At all times, Hard collar Restrictions Weight Bearing Restrictions: No Other Position/Activity Restrictions: Pt stating today MD said he only needed to wear brace when walking/transfering and could take it off in chair. Pain: Pain Assessment Pain Scale: 0-10 Pain Score: 9  Pain  Location: Shoulder Pain Orientation: Right Pain Intervention(s): Medication (See eMAR)   Therapy/Group: Individual Therapy  Simonne Come 08/14/2020, 3:05 PM

## 2020-08-14 NOTE — Care Management (Signed)
[facility] Individual Statement of Services  Patient Name:  Francisco [name]  Date:  08/14/2020  Welcome to the [facility].  Our goal is to provide you with an individualized program based on your diagnosis and situation, designed to meet your specific needs.  With this comprehensive rehabilitation program, you will be expected to participate in at least 3 hours of rehabilitation therapies Monday-Friday, with modified therapy programming on the weekends.  Your rehabilitation program will include the following services:  Physical Therapy (PT), Occupational Therapy (OT), 24 hour per day rehabilitation nursing, Therapeutic Recreaction (TR), Psychology, Neuropsychology, Care Coordinator, Rehabilitation Medicine, Nutrition Services, Pharmacy Services and Other  Weekly team conferences will be held on Tuesdays to discuss your progress.  Your Inpatient Rehabilitation Care Coordinator will talk with you frequently to get your input and to update you on team discussions.  Team conferences with you and your family in attendance may also be held.  Expected length of stay: 7-10 days    Overall anticipated outcome: Supervision  Depending on your progress and recovery, your program may change. Your Inpatient Rehabilitation Care Coordinator will coordinate services and will keep you informed of any changes. Your Inpatient Rehabilitation Care Coordinator's name and contact numbers are listed  below.  The following services may also be recommended but are not provided by the [facility] will be made to provide these services after discharge if needed.  Arrangements include referral to agencies that provide these services.  Your insurance has been verified to be:  [employer] [facility] [employer] Medicare  Your primary doctor is:   Ramonita Lab  Pertinent information will be shared with your doctor and your insurance company.  Inpatient Rehabilitation Care Coordinator:  Cathleen Corti [phone] or ([phone]  Information discussed with and copy given to patient by: Rana Snare, 08/14/2020, 11:38 AM

## 2020-08-15 ENCOUNTER — Inpatient Hospital Stay (HOSPITAL_COMMUNITY): Payer: Medicare Other | Admitting: Occupational Therapy

## 2020-08-15 ENCOUNTER — Inpatient Hospital Stay (HOSPITAL_COMMUNITY): Payer: Medicare Other

## 2020-08-15 DIAGNOSIS — G7000 Myasthenia gravis without (acute) exacerbation: Secondary | ICD-10-CM

## 2020-08-15 DIAGNOSIS — M792 Neuralgia and neuritis, unspecified: Secondary | ICD-10-CM

## 2020-08-15 DIAGNOSIS — N1831 Chronic kidney disease, stage 3a: Secondary | ICD-10-CM

## 2020-08-15 NOTE — Progress Notes (Signed)
Occupational Therapy Session Note  Patient Details  Name: Francisco [name] MRN: [id number] Date of Birth: 1946/08/18  Today's Date: 08/15/2020 OT Individual Time: 2863-8177 OT Individual Time Calculation (min): 57 min    Short Term Goals: Week 1:  OT Short Term Goal 1 (Week 1): STG=LTG d/t ELOS  Skilled Therapeutic Interventions/Progress Updates:    Treatment session with focus on functional transfers, sit <> stand, and dynamic standing balance.  Pt received semi-reclined in bed declining shower, but agreeable to wash up at sink this session.  Pt reports need to toilet.  Completed bed mobility with CGA to come to sitting at EOB.  Sit > stand with CGA and ambulated to toilet with RW with min assist.  Pt demonstrating carryover of education with safety and technique when navigating incline in to bathroom.  Pt completed toileting tasks with close supervision.  Engaged in bathing and dressing at sit > stand level at sink with pt able to thread BLE into pants, requiring assistance for donning TEDS and gripper socks.  Pt completed oral care in standing with CGA to close supervision for standing balance.  Engaged in sit <> stand and dynamic standing balance while engaging in corn hole/bean bag toss.  Pt demonstrating increased stability in standing and completing sit <> stand with close supervision.  CGA for dynamic standing when tossing bean bags.  Pt returned to room and remained upright in w/c with seat belt alarm on and all needs in reach.  Therapy Documentation Precautions:  Precautions Precautions: Cervical, Fall Precaution Booklet Issued: Yes (comment) Precaution Comments: no bending, lifting >10lbs, twisting, arching Required Braces or Orthoses: Cervical Brace Cervical Brace: At all times, Hard collar Restrictions Weight Bearing Restrictions: No Other Position/Activity Restrictions: Pt stating today MD said he only needed to wear brace when walking/transfering and could take it off in  chair. Pain: Pain Assessment Pain Scale: 0-10 Pain Score: 4  Pain Location: Shoulder Pain Orientation: Right Pain Descriptors / Indicators: Stabbing Pain Intervention(s): Refused;Repositioned   Therapy/Group: Individual Therapy  Simonne Come 08/15/2020, 10:08 AM

## 2020-08-15 NOTE — Progress Notes (Addendum)
[place] PHYSICAL MEDICINE & REHABILITATION PROGRESS NOTE   Subjective/Complaints:  Overall feeling well. Pain is much better. Pleased that he's getting stronger. Did stairs yesterday  ROS: Patient denies fever, rash, sore throat, blurred vision, nausea, vomiting, diarrhea, cough, shortness of breath or chest pain,   headache, or mood change.   Objective:   No results found. No results for input(s): WBC, HGB, HCT, PLT in the last 72 hours. Recent Labs    08/12/20 1002  NA 141  K 4.3  CL 105  CO2 26  GLUCOSE 109*  BUN 23  CREATININE 1.19  CALCIUM 9.2    Intake/Output Summary (Last 24 hours) at 08/15/2020 0921 Last data filed at 08/14/2020 2300 Gross per 24 hour  Intake 477 ml  Output 400 ml  Net 77 ml        Physical Exam: Vital Signs Blood pressure 135/66, pulse 66, temperature (!) 97.3 F (36.3 C), resp. rate 18, height 5\' 10"  (1.778 m), SpO2 100 %.   Constitutional: No distress . Vital signs reviewed. HEENT: EOMI, oral membranes moist Neck: supple Cardiovascular: RRR without murmur. No JVD    Respiratory/Chest: CTA Bilaterally without wheezes or rales. Normal effort    GI/Abdomen: BS +, non-tender, non-distended Ext: no clubbing, cyanosis, or edema Psych: pleasant and cooperative Musculoskeletal:     Comments: TTP R shoulder, sensitive to touch and back of neck around incision. TTP over R shoulder as well-  Skin:    General: Skin is warm and dry.  Neurological: Ox3 Bilateral upper extremities: 5/5 proximal distal Right lower extremity: 5/5 proximal distal Left lower extremity: Hip flexion 4/5, knee extension 4+/5, dorsiflexion 3+/5 Sensation diminished to light touch in bilateral hands and left foot--stable    Assessment/Plan: 1. Functional deficits which require 3+ hours per day of interdisciplinary therapy in a comprehensive inpatient rehab setting.  Physiatrist is providing close team supervision and 24 hour management of active medical problems  listed below.  Physiatrist and rehab team continue to assess barriers to discharge/monitor patient progress toward functional and medical goals  Care Tool:  Bathing    Body parts bathed by patient: Right arm, Left arm, Chest, Abdomen, Front perineal area, Buttocks, Right upper leg, Left upper leg, Face   Body parts bathed by helper: Right lower leg, Left lower leg     Bathing assist Assist Level: Moderate Assistance - Patient 50 - 74%     Upper Body Dressing/Undressing Upper body dressing   What is the patient wearing?: Pull over shirt    Upper body assist Assist Level: Supervision/Verbal cueing    Lower Body Dressing/Undressing Lower body dressing      What is the patient wearing?: Underwear/pull up, Pants     Lower body assist Assist for lower body dressing: Maximal Assistance - Patient 25 - 49%     Toileting Toileting    Toileting assist Assist for toileting: Minimal Assistance - Patient > 75% Assistive Device Comment: urinal   Transfers Chair/bed transfer  Transfers assist     Chair/bed transfer assist level: Minimal Assistance - Patient > 75% Chair/bed transfer assistive device: Programmer, multimedia   Ambulation assist      Assist level: Minimal Assistance - Patient > 75% Assistive device: Walker-rolling Max distance: 35   Walk 10 feet activity   Assist     Assist level: Minimal Assistance - Patient > 75% Assistive device: Walker-rolling   Walk 50 feet activity   Assist Walk 50 feet with 2 turns activity  did not occur: Safety/medical concerns         Walk 150 feet activity   Assist Walk 150 feet activity did not occur: Safety/medical concerns         Walk 10 feet on uneven surface  activity   Assist Walk 10 feet on uneven surfaces activity did not occur: Safety/medical concerns         Wheelchair     Assist Will patient use wheelchair at discharge?: No   Wheelchair activity did not occur: N/A          Wheelchair 50 feet with 2 turns activity    Assist    Wheelchair 50 feet with 2 turns activity did not occur: N/A       Wheelchair 150 feet activity     Assist  Wheelchair 150 feet activity did not occur: N/A       Blood pressure 135/66, pulse 66, temperature (!) 97.3 F (36.3 C), resp. rate 18, height 5\' 10"  (1.778 m), SpO2 100 %.  Medical Problem List and Plan: 1.  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   12/3- reverified this to pt- about cervical collar as above.              -ELOS/Goals: 10-14 days/supervision/mod I             continue CIR PT, OT 2.  Antithrombotics: -DVT/anticoagulation:  Pharmaceutical: Lovenox             -antiplatelet therapy: N/A 3. Pain Management: Now on tylenol prn as well as  Celebrex bid DC'd.             Continue gabapentin 300 mg tid, adjust as necessary             Oxycodone prn.  12/2- will add Lyrica 50 mg BID and con't gabapentin for now, for burning- will switch over to Lyrica if helpful.    12/4- pt reports improvement in pain. Continue current reg 4. Mood: LCSW to follow for evaluation and support.              -antipsychotic agents: N/A 5. Neuropsych: This patient is capable of making decisions on his own behalf. 6. Skin/Wound Care: Monitor wound for healing.  7. Fluids/Electrolytes/Nutrition: Monitor CMP ordered for tomorrow a.m. 8. COPD/OSA: Uses oxygen 2 L at bedime per records.  9. CKD stage 3: Baseline SCr around 1.39-1.5 in the past couple of months. 1.39 on 11/18.              CMP 12/1 wnl. 10. MG: Has been stable on Imuran 150 mg/day and Mestinon 60 mg bid.    12/2- stable- con't regimen             Regulate therapies if necessary 11. Anemia of chronic disease: Pre-op H/H- 10.4/32.7.               CBC ordered for tomorrow a.m. 12.  Hypertension: Monitor             Cozaar  12/4 bp controlled    13.  Leucocytosis: WBC 10.7 on 11/18.                14. RLS: managed by ropinirole at bedtime.  15. Constipation  12/2- will add Colace 100 mg BID for now.   12/4 last bm 12/1  LOS: 3 days A FACE TO [place] 08/15/2020, 9:21 AM

## 2020-08-15 NOTE — Progress Notes (Signed)
Physical Therapy Session Note  Patient Details  Name: Francisco [name] MRN: [id number] Date of Birth: 1946/01/30  Today's Date: 08/15/2020 PT Individual Time: 0900-0927 + 1415 - 1525 PT Individual Time Calculation (min): 27 min  + 70 min  Short Term Goals: Week 1:  PT Short Term Goal 1 (Week 1): pt to demonstrate CGA for supine<>sit PT Short Term Goal 2 (Week 1): pt to demonstrate CGA for transfers with LRAD PT Short Term Goal 3 (Week 1): pt to demonstrate gait with LRAD 50' CGA PT Short Term Goal 4 (Week 1): pt to demonstrate stairs one rail CGA  Skilled Therapeutic Interventions/Progress Updates:     1st session: Pt greeted seated in w/c with safety belt alarm on, agreeable to PT session, reports mild posterior neck pain. Rest breaks and redirection provided for pain management. Dwight on throughout session and remained on at end of session. Pt quite verbose during session but pleasant and cooperative. W/c transport for time management from his room to main therapy gym. He ambulated ~152ft with minA and RW while on level surfaces, cues for maintaining proximity to RW as well as reducing downward gaze. L knee in slight flexion during terminal stance but no buckling noted. Slight unsteadiness noted with turns and cues required for safety approach to sitting surface. Brief seated rest break required for recovery before beginning standing there-ex. He ambulated ~65ft with minA and RW to face outside of // bars. With minA guard and BUE support to bars, he completed the following standing there-ex with mirror for visual feedback and cues throughout for sequencing and technique: -1x15 mini-squats -1x15 bilateral hip abduction *Increased difficulty with R hip abduction due to single leg stance time on LLE  He required seated rest break after completing these prior to ambulating ~19ft back to his w/c with minA and RW. Returned to his room where he performed stand step transfer with minA and RW from w/c  to recliner, requiring cues for safety approach, RW management and hand placement to assist with controlled descent. He remained seated in w/c with needs in reach, safety belt alarm on.  2nd session: Pt greeted supine in bed, NT present for routine vitals, pt agreeable to PT session. Initially, he reports no pain, however after completing supine<>sit with supervision (via log rolling technique), had a a flare of neck pain (R lateral neck) that he described as "burning." With extra time provided and cues for relaxation techniques, this improved. He performed stand<>pivot with CGA from EOB to w/c with cues for stepping pattern and hand placement. W/c transport to ortho gym where he completed another stand<>pivot transfer from w/c to Nustep. He completed x10 min of Nustep at workload of 4, emphasizing cardiorespiratory endurance and BLE strengthening. Completed stand<>pivot with minA from Nustep to w/c and then performed car transfer with minA and no AD via stand<>pivot technique. He would often try to grab the door while transferring and required education on safe technique; anticipate this would improve if using RW. He then performed stair training, negotiated up/down 4steps via side-stepping method while facing hand rail with BUE's gripping rail, requiring minA for balance due to posterior bias. After seated rest break, he then completed this again with a step-to pattern using both hand rails, able to navigate up/down x8 prior to fatigue and again requiring minA guard due to posterior bias. After seated rest break, he ambulated 2x189ft (from main therapy gym to chairs outside [place]) with [place] and [place]. He continues to show decreased cadence and  B step length and with fatigue, showed increased unsteadiness with mild L knee buckling that was self corrected. Cues provided for normalizing gait pattern, erect posture and forward gaze. He completed session by performing standing toe taps on soft cones with minA guard  and RW support, emphasis on BLE coordination and control as well as single leg stance time. Pt returned to his room in his w/c and performed stand <>pivot transfer with minA and no AD from w/c to EOB. Able to perform sit>supine with HOB elevated with supervision. Pt remained semi-reclined in bed with needs in reach, bed alarm on.   Therapy Documentation Precautions:  Precautions Precautions: Cervical, Fall Precaution Booklet Issued: Yes (comment) Precaution Comments: no bending, lifting >10lbs, twisting, arching Required Braces or Orthoses: Cervical Brace Cervical Brace: At all times, Hard collar Restrictions Weight Bearing Restrictions: No Other Position/Activity Restrictions: Pt stating today MD said he only needed to wear brace when walking/transfering and could take it off in chair.  Therapy/Group: Individual Therapy  Amol Domanski P Eulalie Speights PT 08/15/2020, 7:54 AM

## 2020-08-16 ENCOUNTER — Inpatient Hospital Stay (HOSPITAL_COMMUNITY): Payer: Medicare Other

## 2020-08-16 NOTE — Progress Notes (Signed)
Occupational Therapy Session Note  Patient Details  Name: Francisco [name] MRN: [id number] Date of Birth: 1946-04-12  Today's Date: 08/16/2020 OT Individual Time: 8616-8372 OT Individual Time Calculation (min): 55 min    Short Term Goals: Week 1:  OT Short Term Goal 1 (Week 1): STG=LTG d/t ELOS  Skilled Therapeutic Interventions/Progress Updates:     Pt received in bed agreeable to OT. Pt requesting to shower. Burning pain in incision site but not rated. Pt provided rest and therapy to tolerance  ADL:  Pt completes bathing with supervision at sit to stand level in shower with shower seat and A to wash B feet.  Pt completes UB dressing with with se tup Pt completes LB dressing with use of reacher to thread BLE into pants. Pt requries CGA-supervision for adbancing pants past hips Pt completes footwear with A to doff and don socks, but uses LHSS for boat shoes Pt completes grooming seated with setup for energy conservation seated Pt completes shower/Tub transfer with CGA at ambulatory level    Pt left at end of session in bed with exit alarm on, call light in reach and all needs met   Therapy Documentation Precautions:  Precautions Precautions: Cervical, Fall Precaution Booklet Issued: Yes (comment) Precaution Comments: no bending, lifting >10lbs, twisting, arching Required Braces or Orthoses: Cervical Brace Cervical Brace: At all times, Hard collar Restrictions Weight Bearing Restrictions: No Other Position/Activity Restrictions: Pt stating today MD said he only needed to wear brace when walking/transfering and could take it off in chair. General:   Vital Signs: Therapy Vitals Temp: 97.8 F (36.6 C) Pulse Rate: (!) 57 Resp: 16 BP: (!) 146/64 Patient Position (if appropriate): Lying Oxygen Therapy SpO2: 97 % O2 Device: Room Air Pain:   ADL: ADL Eating: Set up Grooming: Minimal assistance Where Assessed-Grooming: Standing at sink Upper Body Bathing:  Supervision/safety Where Assessed-Upper Body Bathing: Shower Lower Body Bathing: Moderate assistance Where Assessed-Lower Body Bathing: Shower Upper Body Dressing: Supervision/safety Where Assessed-Upper Body Dressing: Standing at sink Lower Body Dressing: Moderate assistance Where Assessed-Lower Body Dressing: Sitting at sink, Standing at sink, Wheelchair Toileting: Moderate assistance Where Assessed-Toileting: Glass blower/designer: Psychiatric nurse Method: Counselling psychologist: Energy manager: Environmental education officer Method: [employer]    Praxis   Exercises:   Other Treatments:     Therapy/Group: Individual Therapy  Tonny Branch 08/16/2020, 7:19 AM

## 2020-08-17 ENCOUNTER — Inpatient Hospital Stay (HOSPITAL_COMMUNITY): Payer: Medicare Other

## 2020-08-17 ENCOUNTER — Inpatient Hospital Stay (HOSPITAL_COMMUNITY): Payer: Medicare Other | Admitting: Physical Therapy

## 2020-08-17 ENCOUNTER — Encounter (HOSPITAL_COMMUNITY): Payer: Medicare Other | Admitting: Psychology

## 2020-08-17 LAB — BASIC METABOLIC PANEL
Anion gap: 11 (ref 5–15)
BUN: 34 mg/dL — ABNORMAL HIGH (ref 8–23)
CO2: 26 mmol/L (ref 22–32)
Calcium: 9.2 mg/dL (ref 8.9–10.3)
Chloride: 106 mmol/L (ref 98–111)
Creatinine, Ser: 1.26 mg/dL — ABNORMAL HIGH (ref 0.61–1.24)
GFR, Estimated: 59 mL/min — ABNORMAL LOW (ref >60)
Glucose, Bld: 108 mg/dL — ABNORMAL HIGH (ref 70–99)
Potassium: 3.9 mmol/L (ref 3.5–5.1)
Sodium: 143 mmol/L (ref 135–145)

## 2020-08-17 LAB — CBC WITH DIFFERENTIAL/PLATELET
Abs Immature Granulocytes: 0.11 10*3/uL — ABNORMAL HIGH (ref 0.00–0.07)
Basophils Absolute: 0 10*3/uL (ref 0.0–0.1)
Basophils Relative: 0 %
Eosinophils Absolute: 0.1 10*3/uL (ref 0.0–0.5)
Eosinophils Relative: 1 %
HCT: 32 % — ABNORMAL LOW (ref 39.0–52.0)
Hemoglobin: 9.8 g/dL — ABNORMAL LOW (ref 13.0–17.0)
Immature Granulocytes: 1 %
Lymphocytes Relative: 11 %
Lymphs Abs: 1 10*3/uL (ref 0.7–4.0)
MCH: 32.9 pg (ref 26.0–34.0)
MCHC: 30.6 g/dL (ref 30.0–36.0)
MCV: 107.4 fL — ABNORMAL HIGH (ref 80.0–100.0)
Monocytes Absolute: 1.2 10*3/uL — ABNORMAL HIGH (ref 0.1–1.0)
Monocytes Relative: 13 %
Neutro Abs: 6.9 10*3/uL (ref 1.7–7.7)
Neutrophils Relative %: 74 %
Platelets: 397 10*3/uL (ref 150–400)
RBC: 2.98 MIL/uL — ABNORMAL LOW (ref 4.22–5.81)
RDW: 13.5 % (ref 11.5–15.5)
WBC: 9.4 10*3/uL (ref 4.0–10.5)
nRBC: 0 % (ref 0.0–0.2)

## 2020-08-17 LAB — HEPATIC FUNCTION PANEL
ALT: 18 U/L (ref 0–44)
AST: 15 U/L (ref 15–41)
Albumin: 3.1 g/dL — ABNORMAL LOW (ref 3.5–5.0)
Alkaline Phosphatase: 48 U/L (ref 38–126)
Bilirubin, Direct: <0.1 mg/dL (ref 0.0–0.2)
Total Bilirubin: 0.5 mg/dL (ref 0.3–1.2)
Total Protein: 6.5 g/dL (ref 6.5–8.1)

## 2020-08-17 MED ORDER — TRAZODONE HCL 50 MG PO TABS
25.0000 mg | ORAL_TABLET | Freq: Every day | ORAL | Status: DC
  Administered 2020-08-17 – 2020-08-19 (×3): 25 mg via ORAL
  Filled 2020-08-17 (×3): qty 1

## 2020-08-17 NOTE — Consult Note (Signed)
Neuropsychological Consultation   Patient:   Francisco [name]   DOB:   19-Oct-1945  MR Number:  [id number]  Location:  [place] [place] [id number] [place] [address] Dept: [place]: [phone]           Date of Service:   08/17/2020  Start Time:   9 AM End Time:   10 AM  Provider/Observer:  Ilean Skill, Psy.D.       Clinical Neuropsychologist       Billing Code/Service: 82993  Chief Complaint:    Francisco [name] is a 74 year old male with history of hypertension, MG, COPD, chronic kidney disease stage III, shortness of breath/OSA noncompliant with CPAP, prostate cancer history, CIS-plans for stent future, lumbar and cervical stenosis with developing acute on chronic weakness with spasticity and neurogenic bladder 07/14/2020.  MRI revealed moderate to severe spinal stenosis C3-4 with hyperintense signal due to compressive changes.  Patient with C3-C5 decompressive laminectomy.  Patient limited by bilateral upper extremity weakness, impaired standing balance, left lower extremity weakness and unsteady gait.  CIR recommended due to functional decline.  Reason for Service:  Patient referred for neuropsychological consultation due to coping adjustment issues following cervical decompressive surgery with intent on return to work.  Below is the HPI for the current admission.  HPI: Francisco [name] is a 74 year old male with history of HTN, MG, COPD, CKD-3,  SOB/OSA--non compliant with CPAP, prostate cancer s/p radical prostatectomy, CAS- plans for stent in the future, lumbar and cervical stenosis s/p ACDF C5-C7 who started having acute on chronic weakness with spasticity and neurogenic bladder 07/14/20 and MRI reveled moderate to severe spinal stenosis C3-C4 with hyperintense signal due to compressive changes. He was evaluated by Dr. Cari Caraway and admitted to [facility] on 1126/21 for C3-C5  decompressive laminectomy with posterior lateral arthrodesis C3-C6. Post op to wear collar when OOB. Drain removed 11/29 and on lovenox for DVR prophylaxis. Therapy ongoing and patient limited by  BUE weakness, impaired standing balance, LLE weakness and unsteady gait. CIR recommended due to functional decline.   Current Status:  Upon entering the room, the patient was sound asleep and initially unable to wake up.  Physical stimulation and calling name still took extended time to arouse.  Patient woke up quickly and was alert for the initial discussions but would become drowsy suddenly.  Patient reported that he is not sleeping well at night and felt like he only got about 3 hours of sleep past several nights.  Patient is noncompliant with CPAP.  When the patient was fully awake he was alert and oriented with good mental status and cognition.  Patient described residual pain post surgery but that he was doing much better as far as walking and improving gait and balance issues.  Patient motivated to engage and work hard in therapy with plan to return to work.  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  Behavioral Observation: Francisco [name]  presents as a 74 y.o.-year-old Right Caucasian Male who appeared his stated age. his dress was Appropriate and he was Well Groomed and his manners were Appropriate to the situation.  his participation was indicative of Appropriate and Attentive behaviors.  There were any physical disabilities noted.  he displayed an appropriate level of cooperation and motivation.  Interactions:    Active Once patient was able to be woken he was alert and oriented but did have times of acute drowsiness and lethargy but then reoriented himself.  Attention:   within normal limits and attention span and concentration were age appropriate  Memory:   within normal limits; recent and  remote memory intact  Visuo-spatial:  not examined  Speech (Volume):  normal  Speech:   normal; normal  Thought Process:  Coherent and Relevant  Though Content:  WNL; not suicidal and not homicidal  Orientation:   person, place, time/date and situation  Judgment:   Good  Planning:   Good  Affect:    Appropriate  Mood:    Euthymic  Insight:   Good  Intelligence:   high  Medical History:   Past Medical History:  Diagnosis Date  . Arthritis    lower left hip  . Atypical angina ([place])   . Bilateral hand numbness    from back surgery  . Bronchitis, chronic ([place])   . Cancer [facility])    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; Merkel cell cancer, and Basal cell cancer (twice; 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  . Carotid stenosis   . CKD (chronic kidney disease) stage 3, GFR 30-59 ml/min (HCC)   . 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 DDD (degenerative disc disease), cervical   . Hypercholesterolemia   . Hypertension   . Hypothyroidism    pt takes Levothyroxine daily  . Lumbosacral spinal stenosis   . Myasthenia gravis, adult form ([place])   . PAD (peripheral artery disease) ([place])   . Shortness of breath    Lung MD- Dr Darlin Coco  . Sleep apnea    do not use CPAP every night         Patient Active Problem List   Diagnosis Date Noted  . S/P cervical spinal fusion   . Leukocytosis   . Essential hypertension   . Anemia of chronic disease   . Postoperative pain   . Neuropathic pain   . Cervical myelopathy ([place]) 08/07/2020  . Leg weakness, bilateral 07/13/2020  . Myalgia 10/30/2019  . PAD (peripheral artery disease) ([place]) 06/06/2019  . CKD (chronic kidney disease) stage 3, GFR 30-59 ml/min (HCC) 04/29/2019  . B12 deficiency 01/23/2019  . Left arm numbness 02/28/2018  . Neck pain 02/28/2018  . Anemia 02/02/2017  . DDD (degenerative disc disease), cervical 02/02/2017  . Hypothyroid 02/02/2017  . MRSA (methicillin resistant staph aureus) culture positive  02/02/2017  . Nocturnal hypoxia 02/02/2017  . Senile purpura ([place]) 10/03/2016  . Essential hypertension, benign 09/16/2016  . Bilateral carotid artery disease ([place]) 09/16/2016  . 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/18/2016  . 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 History of prostate cancer 12/21/2015  . Left carpal tunnel syndrome 11/11/2015  . Kidney stone on left side 07/05/2015  . Myasthenia gravis ([place]) 05/26/2015  . Long-term use of high-risk medication 10/15/2014  . Persistent cough 09/10/2014  . Pure hypercholesterolemia 07/25/2014  . Spinal stenosis, lumbar region, with neurogenic claudication 02/24/2014  . Lumbosacral stenosis with neurogenic claudication ([place]) 02/24/2014  . COPD (chronic obstructive pulmonary disease) ([place]) 01/22/2014    Psychiatric History:  No prior psychiatric history  Family Med/Psych History:  Family History  Problem Relation Age of Onset  . Hypertension Mother   . Stroke Mother   . Stroke Father     Impression/DX:  Arne Schlender. [place] is a 74 year old male with history of hypertension,  MG, COPD, chronic kidney disease stage III, shortness of breath/OSA noncompliant with CPAP, prostate cancer history, CIS-plans for stent future, lumbar and cervical stenosis with developing acute on chronic weakness with spasticity and neurogenic bladder 07/14/2020.  MRI revealed moderate to severe spinal stenosis C3-4 with hyperintense signal due to compressive changes.  Patient with C3-C5 decompressive laminectomy.  Patient limited by bilateral upper extremity weakness, impaired standing balance, left lower extremity weakness and unsteady gait.  CIR recommended due to functional decline.  Upon entering the room, the patient was sound asleep and initially unable to wake up.  Physical stimulation and calling name still took extended time to arouse.  Patient woke up quickly and was alert for the initial discussions but would become drowsy suddenly.  Patient reported  that he is not sleeping well at night and felt like he only got about 3 hours of sleep past several nights.  Patient is noncompliant with CPAP.  When the patient was fully awake he was alert and oriented with good mental status and cognition.  Patient described residual pain post surgery but that he was doing much better as far as walking and improving gait and balance issues.  Patient motivated to engage and work hard in therapy with plan to return to work.  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   Disposition/Plan:  Today we worked on coping and adjustment issues and the patient appears to be doing well overall.  High motivation for therapy denies any significant depression or anxiety type symptoms.  Patient is planned to return to work seems reasonable although we will have some initial physical limitations including wearing a collar for the next 30 days.  Patient's sleep is described as variable and only getting approximately 3 hours of sleep per night.  The patient does have trazodone upon request but was not given the night before.  Patient should actively use his CPAP device and his sleep issues may be significantly improved with CPAP use on the unit and beyond.          Electronically Signed   _______________________ Ilean Skill, Psy.D. Clinical Neuropsychologist

## 2020-08-17 NOTE — Progress Notes (Signed)
Occupational Therapy Session Note  Patient Details  Name: [name] MRN: [id number] Date of Birth: [birth year]  Today's Date: 08/17/2020 OT Individual Time: 1015-1100 OT Individual Time Calculation (min): 45 min    Short Term Goals: Week 1:  OT Short Term Goal 1 (Week 1): STG=LTG d/t ELOS  Skilled Therapeutic Interventions/Progress Updates:    Pt received sitting in w/c with no c/o pain. Pt agreeable to OT session. Pt was taken to the therapy gym via w/c for time management. No c/o pain. Pt completed 50 ft of functional mobility with the RW to the mat, with CGA overall. Pt sat EOM and completed BUE AROM/gentle strengthening circuit with a 2 lb dowel. Focus on closed chain shoulder flexion exercises to encourage pectoralis activation for more upright posture. Pt then completed 2x20 repetitions of standing level reciprocal stepping activity with BUE support on the RW with CGA overall. Pt fixed his pants and had R LOB with min A required to correct. Discussed safety at home and ways to safely complete LB dressing. Pt returned to his w/c with min A for 50 ft of functional mobility. Pt was returned to his room and left supine with all needs met, bed alarm set.   Therapy Documentation Precautions:  Precautions Precautions: Cervical, Fall Precaution Booklet Issued: Yes (comment) Precaution Comments: no bending, lifting >10lbs, twisting, arching Required Braces or Orthoses: Cervical Brace Cervical Brace: At all times, Hard collar Restrictions Weight Bearing Restrictions: No Other Position/Activity Restrictions: Pt stating today MD said he only needed to wear brace when walking/transfering and could take it off in chair. Therapy/Group: Individual Therapy  Curtis Sites 08/17/2020, 6:42 AM

## 2020-08-17 NOTE — Progress Notes (Signed)
Physical Therapy Session Note  Patient Details  Name: Francisco [name] MRN: [id number] Date of Birth: [birth year]  Today's Date: 08/17/2020 PT Individual Time: 1415-1530 PT Individual Time Calculation (min): 75 min   Short Term Goals: Week 1:  PT Short Term Goal 1 (Week 1): pt to demonstrate CGA for supine<>sit PT Short Term Goal 2 (Week 1): pt to demonstrate CGA for transfers with LRAD PT Short Term Goal 3 (Week 1): pt to demonstrate gait with LRAD 50' CGA PT Short Term Goal 4 (Week 1): pt to demonstrate stairs one rail CGA  Skilled Therapeutic Interventions/Progress Updates:    Pt received seated in w/c in room, agreeable to PT session. Pt reports intermittent pain in neck and down into R shoulder, premedicated prior to start of therapy session. Pt also reports occasional L hip pain with sit to stand transfer due to OA. Sit to stand with CGA to RW throughout session. Ambulation 2 x 150 ft with use of RW and CGA to min A needed for balance. Pt exhibits decreased LE control with onset of fatigue. Standing alt L/R 4" step-taps with RW and CGA progressing to unilateral UE support on RW and min to mod A needed for balance. Pt exhibits decreased control of LLE as compared to RLE with decreased ability to clear LLE on step. Standing squats x 10 reps, x 5 reps to fatigue with no UE support and min A for balance, use of mirror for visual feedback. Sit to stand 2 x 10 reps with UE support on mat table, no RW, min A with focus on use of LE to achieve full, upright stance. Sit to semi-reclined on wedge on mat table with Supervision. Semi-reclined BLE strengthening therex: heel slides, SLR with AAROM needed for LLE, bridges x 10-15 reps each. Pt returned to sitting EOM with min A needed for trunk elevation. Stand pivot transfer back to w/c then back to bed with RW and min A. Sit to supine Supervision. Pt left semi-reclined in bed with needs in reach, bed alarm in place at end of session.  Therapy  Documentation Precautions:  Precautions Precautions: Cervical, Fall Precaution Booklet Issued: Yes (comment) Precaution Comments: no bending, lifting >10lbs, twisting, arching Required Braces or Orthoses: Cervical Brace Cervical Brace: At all times, Hard collar Restrictions Weight Bearing Restrictions: No Other Position/Activity Restrictions: Pt stating today MD said he only needed to wear brace when walking/transfering and could take it off in chair.   Therapy/Group: Individual Therapy   Excell Seltzer, PT, DPT  08/17/2020, 5:07 PM

## 2020-08-17 NOTE — Progress Notes (Signed)
Occupational Therapy Session Note  Patient Details  Name: Francisco [name] MRN: [id number] Date of Birth: 04-20-1946  Today's Date: 08/17/2020 OT Individual Time: 2707-8675 OT Individual Time Calculation (min): 75 min    Short Term Goals: Week 1:  OT Short Term Goal 1 (Week 1): STG=LTG d/t ELOS  Skilled Therapeutic Interventions/Progress Updates:    Pt resting in bed upon arrival.  OT intervention with focus on bed mobility, sitting balance, functional amb with RW, toileting, dressing, sit<>stand, standing balance, BUE functional use, activity tolerance, and safety awareness to increase independence with BADLs. Supine>sit EOB with supervision using bed rails. Pt able to thread pants over BLE and sit<>stand with CGA to pull pants over hips. Pt used sock aid to don socks following demonstration. Pt requested to use bathroom and amb with RW to bathroom. Toileting with CGA. Pt returned to room and completed grooming while seated in w/c.  Pt transtiioned to day room and engaged in standing tasks at table (pipe tree)-assembling and disassembling. Pt also donned gloves and used purple wipes to clean all pieces while standing. Pt returned to room and remained in w/c with all needs within reach and belt alarm activated.   Therapy Documentation Precautions:  Precautions Precautions: Cervical, Fall Precaution Booklet Issued: Yes (comment) Precaution Comments: no bending, lifting >10lbs, twisting, arching Required Braces or Orthoses: Cervical Brace Cervical Brace: At all times, Hard collar Restrictions Weight Bearing Restrictions: No Other Position/Activity Restrictions: Pt stating today MD said he only needed to wear brace when walking/transfering and could take it off in chair.  Pain: Pain Assessment Pain Scale: 0-10 Pain Score: 0-No pain  Therapy/Group: Individual Therapy  [name] 08/17/2020, 9:28 AM

## 2020-08-17 NOTE — Progress Notes (Signed)
[place] PHYSICAL MEDICINE & REHABILITATION PROGRESS NOTE   Subjective/Complaints:  Pt reports had a funny night- felt like couldn't catch his breath every time tried to go to sleep- O2 levels 98%-  Asking for something to sleep- used O2 at home ~ 2-3x/week.   Also had a black stool yesterday- previous one was brown- was concerned   ROS:  Pt denies SOB, abd pain, CP, N/V/C/D, and vision changes  Objective:   No results found. Recent Labs    08/17/20 1029  WBC 9.4  HGB 9.8*  HCT 32.0*  PLT 397   Recent Labs    08/17/20 0546  NA 143  K 3.9  CL 106  CO2 26  GLUCOSE 108*  BUN 34*  CREATININE 1.26*  CALCIUM 9.2    Intake/Output Summary (Last 24 hours) at 08/17/2020 1842 Last data filed at 08/17/2020 1837 Gross per 24 hour  Intake 960 ml  Output --  Net 960 ml        Physical Exam: Vital Signs Blood pressure (!) 142/54, pulse 74, temperature 98 F (36.7 C), temperature source Oral, resp. rate 16, height 5\' 10"  (1.778 m), weight 98.6 kg, SpO2 100 %.   Constitutional: No distress . Vital signs reviewed.Pt sitting up in bedside chair, appropriate, OT in room, NAD HEENT: EOMI, oral membranes moist Neck: supple Cardiovascular: RRR    Respiratory/Chest: CTA B/L- no W/R/R- good air movement  GI/Abdomen: Soft, NT, ND, (+)BS  Ext: no clubbing, cyanosis, or edema Psych: pleasant and cooperative Musculoskeletal:     Comments: TTP R shoulder, sensitive to touch and back of neck around incision. TTP over R shoulder as well- slightly improved Skin:    General: Skin is warm and dry. Posterior neck incision looks great- no erythema, no drainage, no swelling Neurological: Ox3 Bilateral upper extremities: 5/5 proximal distal Right lower extremity: 5/5 proximal distal Left lower extremity: Hip flexion 4/5, knee extension 4+/5, dorsiflexion 3+/5 Sensation diminished to light touch in bilateral hands and left foot--stable    Assessment/Plan: 1. Functional deficits  which require 3+ hours per day of interdisciplinary therapy in a comprehensive inpatient rehab setting.  Physiatrist is providing close team supervision and 24 hour management of active medical problems listed below.  Physiatrist and rehab team continue to assess barriers to discharge/monitor patient progress toward functional and medical goals  Care Tool:  Bathing    Body parts bathed by patient: Right arm, Left arm, Chest, Abdomen, Face   Body parts bathed by helper: Right lower leg, Left lower leg     Bathing assist Assist Level: Supervision/Verbal cueing     Upper Body Dressing/Undressing Upper body dressing   What is the patient wearing?: Pull over shirt, Orthosis    Upper body assist Assist Level: Minimal Assistance - Patient > 75% (min A for cervical collar)    Lower Body Dressing/Undressing Lower body dressing      What is the patient wearing?: Pants     Lower body assist Assist for lower body dressing: Contact Guard/Touching assist     Toileting Toileting    Toileting assist Assist for toileting: Contact Guard/Touching assist Assistive Device Comment: urinal   Transfers Chair/bed transfer  Transfers assist     Chair/bed transfer assist level: Minimal Assistance - Patient > 75% Chair/bed transfer assistive device: Programmer, multimedia   Ambulation assist      Assist level: Minimal Assistance - Patient > 75% Assistive device: Walker-rolling Max distance: 150'   Walk 10 feet activity  Assist     Assist level: Minimal Assistance - Patient > 75% Assistive device: Walker-rolling   Walk 50 feet activity   Assist Walk 50 feet with 2 turns activity did not occur: Safety/medical concerns  Assist level: Minimal Assistance - Patient > 75% Assistive device: Walker-rolling    Walk 150 feet activity   Assist Walk 150 feet activity did not occur: Safety/medical concerns  Assist level: Minimal Assistance - Patient > 75% Assistive  device: Walker-rolling    Walk 10 feet on uneven surface  activity   Assist Walk 10 feet on uneven surfaces activity did not occur: Safety/medical concerns         Wheelchair     Assist Will patient use wheelchair at discharge?: No   Wheelchair activity did not occur: N/A         Wheelchair 50 feet with 2 turns activity    Assist    Wheelchair 50 feet with 2 turns activity did not occur: N/A       Wheelchair 150 feet activity     Assist  Wheelchair 150 feet activity did not occur: N/A       Blood pressure (!) 142/54, pulse 74, temperature 98 F (36.7 C), temperature source Oral, resp. rate 16, height 5\' 10"  (1.778 m), weight 98.6 kg, SpO2 100 %.  Medical Problem List and Plan: 1.  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   12/3- reverified this to pt- about cervical collar as above.              -ELOS/Goals: 10-14 days/supervision/mod I             continue CIR PT, OT 2.  Antithrombotics: -DVT/anticoagulation:  Pharmaceutical: Lovenox             -antiplatelet therapy: N/A 3. Pain Management: Now on tylenol prn as well as  Celebrex bid DC'd.             Continue gabapentin 300 mg tid, adjust as necessary             Oxycodone prn.  12/2- will add Lyrica 50 mg BID and con't gabapentin for now, for burning- will switch over to Lyrica if helpful.    12/4- pt reports improvement in pain.  12/6- reports nerve pain doing somewhat better- con't Lyrica  Continue current reg 4. Mood: LCSW to follow for evaluation and support.              -antipsychotic agents: N/A 5. Neuropsych: This patient is capable of making decisions on his own behalf. 6. Skin/Wound Care: Monitor wound for healing.  7. Fluids/Electrolytes/Nutrition: Monitor CMP  ordered for tomorrow a.m. 8. COPD/OSA: Uses oxygen 2 L at bedime per records.   12/6- only used at home 2-3x/week- sats >95% 9. CKD stage 3: Baseline SCr around 1.39-1.5 in the past couple of months. 1.39 on 11/18.              CMP 12/1 wnl.  12/6- Cr 1.26 and BUN 34- pretty stab;e 10. MG: Has been stable on Imuran 150 mg/day and Mestinon 60 mg bid.    12/2- stable- con't regimen  12/6- no Sx's worsening per pt  Regulate therapies if necessary 11. Anemia of chronic disease: Pre-op H/H- 10.4/32.7.              CBC ordered for tomorrow a.m.  12/6- Hb 9.8- not bad considering surgery- con't Iron- monitor for continued black stools- if occurs again, will check Occult blood 12.  Hypertension: Monitor             Cozaar  12/4 bp controlled    13.  Leucocytosis: WBC 10.7 on 11/18.                14. RLS: managed by ropinirole at bedtime.  15. Constipation  12/2- will add Colace 100 mg BID for now.   12/4 last bm 12/1 16. Insomnia  12.6- will give trazodone 25 mg QHS.   LOS: 5 days A FACE TO FACE EVALUATION WAS PERFORMED  Francisco [name] 08/17/2020, 6:42 PM

## 2020-08-18 ENCOUNTER — Inpatient Hospital Stay (HOSPITAL_COMMUNITY): Payer: Medicare Other

## 2020-08-18 ENCOUNTER — Inpatient Hospital Stay (HOSPITAL_COMMUNITY): Payer: Medicare Other | Admitting: Physical Therapy

## 2020-08-18 NOTE — Progress Notes (Signed)
[place] PHYSICAL MEDICINE & REHABILITATION PROGRESS NOTE   Subjective/Complaints:   Pt reports another black stool, so will check for occult blood- is on Iron.   Said hasn't received AM meds- hurting pretty bad.  "taylor whipped hit butt" yesterday- but did well- can't walk without RW yet.   Asking when staples to be removed- Friday is 14 days.   ROS:   Pt denies SOB, abd pain, CP, N/V/C/D, and vision changes   Objective:   No results found. Recent Labs    08/17/20 1029  WBC 9.4  HGB 9.8*  HCT 32.0*  PLT 397   Recent Labs    08/17/20 0546  NA 143  K 3.9  CL 106  CO2 26  GLUCOSE 108*  BUN 34*  CREATININE 1.26*  CALCIUM 9.2    Intake/Output Summary (Last 24 hours) at 08/18/2020 0924 Last data filed at 08/18/2020 0700 Gross per 24 hour  Intake 1080 ml  Output 950 ml  Net 130 ml        Physical Exam: Vital Signs Blood pressure 133/66, pulse 65, temperature (!) 97.4 F (36.3 C), resp. rate 18, height 5\' 10"  (1.778 m), weight 98.6 kg, SpO2 99 %.   Constitutional: No distress sitting up in bedside chair, appropriate, NAD HEENT: EOMI, oral membranes moist Neck: supple Cardiovascular:RRR  Respiratory/Chest: CTA B/L- no W/R/R- good air movement  GI/Abdomen: Soft, NT, ND, (+)BS  Ext: no clubbing, cyanosis, or edema Psych: pleasant and cooperative still- no change Musculoskeletal:     Comments: TTP R shoulder, sensitive to touch and back of neck around incision. TTP over R shoulder as well- slightly improved Skin:    General: Skin is warm and dry. Posterior neck incision looks great- no erythema, no drainage, no swelling Neurological: Ox3 Bilateral upper extremities: 5/5 proximal distal Right lower extremity: 5/5 proximal distal Left lower extremity: Hip flexion 4/5, knee extension 4+/5, dorsiflexion 3+/5 Sensation diminished to light touch in bilateral hands and left foot--stable    Assessment/Plan: 1. Functional deficits which require 3+ hours  per day of interdisciplinary therapy in a comprehensive inpatient rehab setting.  Physiatrist is providing close team supervision and 24 hour management of active medical problems listed below.  Physiatrist and rehab team continue to assess barriers to discharge/monitor patient progress toward functional and medical goals  Care Tool:  Bathing    Body parts bathed by patient: Right arm, Left arm, Chest, Abdomen, Front perineal area, Right upper leg, Left upper leg, Buttocks, Face   Body parts bathed by helper: Right lower leg, Left lower leg     Bathing assist Assist Level: Minimal Assistance - Patient > 75%     Upper Body Dressing/Undressing Upper body dressing   What is the patient wearing?: Pull over shirt, Orthosis    Upper body assist Assist Level: Supervision/Verbal cueing    Lower Body Dressing/Undressing Lower body dressing      What is the patient wearing?: Underwear/pull up, Pants     Lower body assist Assist for lower body dressing: Contact Guard/Touching assist     Toileting Toileting    Toileting assist Assist for toileting: Contact Guard/Touching assist Assistive Device Comment: urinal   Transfers Chair/bed transfer  Transfers assist     Chair/bed transfer assist level: Minimal Assistance - Patient > 75% Chair/bed transfer assistive device: Programmer, multimedia   Ambulation assist      Assist level: Minimal Assistance - Patient > 75% Assistive device: Walker-rolling Max distance: 150'   Walk  10 feet activity   Assist     Assist level: Minimal Assistance - Patient > 75% Assistive device: Walker-rolling   Walk 50 feet activity   Assist Walk 50 feet with 2 turns activity did not occur: Safety/medical concerns  Assist level: Minimal Assistance - Patient > 75% Assistive device: Walker-rolling    Walk 150 feet activity   Assist Walk 150 feet activity did not occur: Safety/medical concerns  Assist level: Minimal  Assistance - Patient > 75% Assistive device: Walker-rolling    Walk 10 feet on uneven surface  activity   Assist Walk 10 feet on uneven surfaces activity did not occur: Safety/medical concerns         Wheelchair     Assist Will patient use wheelchair at discharge?: No   Wheelchair activity did not occur: N/A         Wheelchair 50 feet with 2 turns activity    Assist    Wheelchair 50 feet with 2 turns activity did not occur: N/A       Wheelchair 150 feet activity     Assist  Wheelchair 150 feet activity did not occur: N/A       Blood pressure 133/66, pulse 65, temperature (!) 97.4 F (36.3 C), resp. rate 18, height 5\' 10"  (1.778 m), weight 98.6 kg, SpO2 99 %.  Medical Problem List and Plan: 1.  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   12/3- reverified this to pt- about cervical collar as above.              -ELOS/Goals: 10-14 days/supervision/mod I             continue CIR PT, OT 2.  Antithrombotics: -DVT/anticoagulation:  Pharmaceutical: Lovenox             -antiplatelet therapy: N/A 3. Pain Management: Now on tylenol prn as well as  Celebrex bid DC'd.             Continue gabapentin 300 mg tid, adjust as necessary             Oxycodone prn.  12/2- will add Lyrica 50 mg BID and con't gabapentin for now, for burning- will switch over to Lyrica if helpful.    12/4- pt reports improvement in pain.  12/6- reports nerve pain doing somewhat better- con't Lyrica 12/7- no meds yet this AM- so hurting- will ask nursing to give- con't regimen  Continue current reg 4. Mood: LCSW to follow for evaluation and support.              -antipsychotic agents: N/A 5. Neuropsych: This patient is capable of making decisions on his own behalf. 6.  Skin/Wound Care: Monitor wound for healing.  7. Fluids/Electrolytes/Nutrition: Monitor CMP ordered for tomorrow a.m. 8. COPD/OSA: Uses oxygen 2 L at bedime per records.   12/6- only used at home 2-3x/week- sats >95% 9. CKD stage 3: Baseline SCr around 1.39-1.5 in the past couple of months. 1.39 on 11/18.              CMP 12/1 wnl.  12/6- Cr 1.26 and BUN 34- pretty stab;e 10. MG: Has been stable on Imuran 150 mg/day and Mestinon 60 mg bid.    12/2- stable- con't regimen  12/6- no Sx's worsening per pt             Regulate therapies if necessary 11. Anemia of chronic disease: Pre-op H/H- 10.4/32.7.              CBC ordered for tomorrow a.m.  12/6- Hb 9.8- not bad considering surgery- con't Iron- monitor for continued black stools- if occurs again, will check Occult blood  12/7- black stool again- will check for occult blood- Hb stable 12.  Hypertension: Monitor             Cozaar  12/4 bp controlled  12/7- BP controlled- 133/66- con't regimen   13.  Leucocytosis: WBC 10.7 on 11/18.                14. RLS: managed by ropinirole at bedtime.  15. Constipation  12/2- will add Colace 100 mg BID for now.   12/4 last bm 12/1 16. Insomnia  12.6- will give trazodone 25 mg QHS.  12/7- slept better- con't regimen   LOS: 6 days A FACE TO FACE EVALUATION WAS PERFORMED  Francisco [name] 08/18/2020, 9:24 AM

## 2020-08-18 NOTE — Progress Notes (Signed)
Occupational Therapy Session Note  Patient Details  Name: Francisco [name] MRN: [id number] Date of Birth: [birth year]  Today's Date: 08/18/2020 OT Individual Time: 1130-1155 OT Individual Time Calculation (min): 25 min    Short Term Goals: Week 1:  OT Short Term Goal 1 (Week 1): STG=LTG d/t ELOS  Skilled Therapeutic Interventions/Progress Updates:    Pt resting in w/c upon arrival. OT intervention with focus on discharge planning, sit<>stand, shower transfers, and safety awareness.  Pt performed half squats from EOM-8x2. Pt also practiced stepping over into simulated shower stall with CGA and min verbal cues for technique. Pt practiced X 2. Pt returned to room and remained in w/c with all needs within reach and belt alarm activated.   Therapy Documentation Precautions:  Precautions Precautions: Cervical, Fall Precaution Booklet Issued: Yes (comment) Precaution Comments: no bending, lifting >10lbs, twisting, arching Required Braces or Orthoses: Cervical Brace Cervical Brace: At all times, Hard collar Restrictions Weight Bearing Restrictions: No Other Position/Activity Restrictions: Pt stating today MD said he only needed to wear brace when walking/transfering and could take it off in chair.  Pain: Pain Assessment Pain Scale: 0-10 Pain Score: 8  Pain Location: Neck Pain Orientation: Mid Patients Stated Pain Goal: 2 Pain Intervention(s):Pain meds admin prior to therapy Therapy/Group: Individual Therapy  Francisco [name] 08/18/2020, 12:11 PM

## 2020-08-18 NOTE — Patient Care Conference (Signed)
Inpatient RehabilitationTeam Conference and Plan of Care Update Date: 08/18/2020   Time: 11:20 AM    Patient Name: Francisco [name]      Medical Record Number: [id number]  Date of Birth: 1945-12-19 Sex: Male         Room/Bed: 7Q73A/1P37T-02 Payor Info: Payor: [place] / Plan: [facility] MEDICARE / Product Type: *No Product type* /    Admit Date/Time:  08/12/2020  9:05 AM  Primary Diagnosis:  Cervical myelopathy [employer])  Hospital Problems: Principal Problem:   Cervical myelopathy [facility])    Expected Discharge Date: Expected Discharge Date: 08/21/20  Team Members Present: Physician leading conference: Dr. Courtney Heys Care Coodinator Present: Loralee Pacas, LCSWA;Keyonia Gluth Creig Hines, RN, BSN, [place] Nurse Present: Other (comment) Philip Aspen, RN) PT Present: Excell Seltzer, PT OT Present: Roanna Epley, COTA;Jennifer Tamala Julian, OT PPS Coordinator present : Gunnar Fusi, SLP     Current Status/Progress Goal Weekly Team Focus  Bowel/Bladder   Continent x2; last BM 12/6  Remain continent x2  Assess every shift and as needed   Swallow/Nutrition/ Hydration             ADL's   bathing/dressing-CGA with AE; functional transfers-CGa; toileting-CGA  supervision overall; LB dressing-min A  education, BADL retraining; activity tolerance, safety awareness, functional transfers   Mobility   CGA to min A with RW, gait up to 150 ft with RW min A  Supervision with LRAD  endurance, LE NMR   Communication             Safety/Cognition/ Behavioral Observations            Pain   Pain reported 7/10 during shift  Pain<5/10  Assess every shift and as needed   Skin   Surgical incision to posterior neck, bruising to b/l abdomen and arms  Prevent breakdown  Assess every shift and as needed     Discharge Planning:  P to d/c to home with his wife who can provider 24/7 care at [facility] [address] A level of care need.   Team Discussion: Continent B/B, given Oxycodone for pain, patient says it works  better. Surgical incision to posterior neck is clean and dry. PT reports patient is contact guard to supervision for ambulation with R/W. He is weaker on the right side. OT reports patient is a contact guard assist for bathing and dressing, transfers, and toileting. Goals are supervision. Patient reports that the Lyrica is helping. Patient on target to meet rehab goals: yes  *See Care Plan and progress notes for long and short-term goals.   Revisions to Treatment Plan:  Not at this time.  Teaching Needs: Continue with family education.  Current Barriers to Discharge: Decreased caregiver support, Medical stability, Home enviroment access/layout, Wound care, Lack of/limited family support and Weight bearing restrictions  Possible Resolutions to Barriers: Continue current medications, continue education on weight bearing precautions, educate on wound care, provide emotional support to patient and family.     Medical Summary Current Status: Might need O2 2L at night; felt SOB; black stool- will check occult- oxycodone helps more than Norco; continent; staples intact- no drainage  Barriers to Discharge: Decreased family/caregiver support;Home enviroment access/layout;Medical stability;Weight bearing restrictions;Wound care  Barriers to Discharge Comments: wife 24/7; might need O2 at night prn; staples to be removed Friday?- not Wednesday Possible Resolutions to [employer] Focus: adamant leaving before friday; feels better keeping his d/c Friday - 12/10- since unsafe with gait/some transfers- needs ot get a little stronger   Continued Need for Acute  Rehabilitation Level of Care: The patient requires daily medical management by a physician with specialized training in physical medicine and rehabilitation for the following reasons: Direction of a multidisciplinary physical rehabilitation program to maximize functional independence : Yes Medical management of patient stability for increased  activity during participation in an intensive rehabilitation regime.: Yes Analysis of laboratory values and/or radiology reports with any subsequent need for medication adjustment and/or medical intervention. : Yes   I attest that I was present, lead the team conference, and concur with the assessment and plan of the team.   Cristi Loron 08/18/2020, 3:20 PM

## 2020-08-18 NOTE — Progress Notes (Signed)
Patient ID: Francisco [name], male   DOB: 08-Feb-1946, 74 y.o.   MRN: [id number]  SW met with pt to provide updates from team conference, and d/c date 12/10. Pt preferred [facility] outpatient location is in [place] close to home but unable to remember the name of location. SW informed will follow-up and see if the site offers both PT/OT, if not referral will be sent to [facility] for outpatient therapies.   SW called [name] ([phone]) to inform on above. When discussing family edu, wife indicated she recently had a breast biopsy and was instructed to not do much for the next few days. States if needed she could come in tomorrow and will have a friend bring here. Tentatively family edu 12/9 1pm-3pm. SW to follow-up.   Loralee Pacas, MSW, [place] Office: [phone] Cell: [phone] Fax: [phone]

## 2020-08-18 NOTE — Progress Notes (Signed)
Physical Therapy Session Note  Patient Details  Name: Francisco [name] MRN: [id number] Date of Birth: 1946-03-05  Today's Date: 08/18/2020 PT Individual Time: 0915-1000; 1300-1415 PT Individual Time Calculation (min): 45 min and 75 min  Short Term Goals: Week 1:  PT Short Term Goal 1 (Week 1): pt to demonstrate CGA for supine<>sit PT Short Term Goal 2 (Week 1): pt to demonstrate CGA for transfers with LRAD PT Short Term Goal 3 (Week 1): pt to demonstrate gait with LRAD 50' CGA PT Short Term Goal 4 (Week 1): pt to demonstrate stairs one rail CGA  Skilled Therapeutic Interventions/Progress Updates:    Session 1: Pt received seated in w/c in room, agreeable to PT session. No complaints of pain at rest, pt does have onset of L hip pain with mobility that subsides at rest. Sit to stand with CGA to RW throughout session. Ambulation 2 x 100 ft, 2 x 150 ft with RW and CGA for balance. Ascend/descend 4 x 6" steps with L handrail and CGA for balance, pt performs stairs laterally. Pt left seated in w/c in room with needs in reach, quick release belt and chair alarm in place at end of session.  Session 2: Pt received seated in w/c in room, agreeable to PT session. Pt reports pain in knees and hips at rest from workout earlier this date. Stand pivot transfer w/c to Nustep with RW and CGA. Nustep level 4 x 10 min with use of LE only for global endurance training. Standing alt L/R target taps with RW and CGA for balance, use of mirror to see targets to avoid cervical flexion. Pt exhibits decreased control of LLE as compared to RLE, exhibits some frustration regarding fair control of LLE. Sit to semi-reclined on mat table with Supervision. Semi-reclined BLE strengthening therex: SKFO, heel slides, SLR x 10-15 reps each. Pt returned to sitting EOB via logroll with mod A for trunk elevation. Pt requests to use the bathroom. Toilet transfer with RW and CGA. Pt returned to bed at end of session. Supervision for sit  to supine with use of bedrail. Provided pt with handout for where to purchase bedrail for use at home. Pt left semi-reclined in bed with needs in reach, bed alarm in place at end of session.  Therapy Documentation Precautions:  Precautions Precautions: Cervical, Fall Precaution Booklet Issued: Yes (comment) Precaution Comments: no bending, lifting >10lbs, twisting, arching Required Braces or Orthoses: Cervical Brace Cervical Brace: At all times, Hard collar Restrictions Weight Bearing Restrictions: No Other Position/Activity Restrictions: Pt stating today MD said he only needed to wear brace when walking/transfering and could take it off in chair.   Therapy/Group: Individual Therapy   Excell Seltzer, PT, DPT  08/18/2020, 12:10 PM

## 2020-08-18 NOTE — Progress Notes (Signed)
Occupational Therapy Session Note  Patient Details  Name: Francisco [name] MRN: [id number] Date of Birth: 29-Jul-1946  Today's Date: 08/18/2020 OT Individual Time: 0700-0758 OT Individual Time Calculation (min): 58 min    Short Term Goals: Week 1:  OT Short Term Goal 1 (Week 1): STG=LTG d/t ELOS  Skilled Therapeutic Interventions/Progress Updates:    Pt resting in bed upon arrival and ready to get OOB for shower and change clothing. Supine>sit EOB with supervision. Sit<>stand and amb with RW to bathroom for shower with CGA. Pt completed bathing with assistance bathing   feet without AE. Pt returned to room and sat EOB for dressing. Pt used reacher and sock aid to assist with LB dressing. CGA for LB dressing tasks. Pt completed grooming tasks standing at sink. Pt transitioned to day room for NuStep BLE therex-7 mins work load 4 with 60 SPM. Pt returned to room and remained in w/c with all needs within reach and belt alarm activated.  Therapy Documentation Precautions:  Precautions Precautions: Cervical, Fall Precaution Booklet Issued: Yes (comment) Precaution Comments: no bending, lifting >10lbs, twisting, arching Required Braces or Orthoses: Cervical Brace Cervical Brace: At all times, Hard collar Restrictions Weight Bearing Restrictions: No Other Position/Activity Restrictions: Pt stating today MD said he only needed to wear brace when walking/transfering and could take it off in chair. Pain:  Pt denies pain this morning    Therapy/Group: Individual Therapy  [name] 08/18/2020, 8:02 AM

## 2020-08-19 ENCOUNTER — Inpatient Hospital Stay (HOSPITAL_COMMUNITY): Payer: Medicare Other

## 2020-08-19 ENCOUNTER — Inpatient Hospital Stay (HOSPITAL_COMMUNITY): Payer: Medicare Other | Admitting: Physical Therapy

## 2020-08-19 MED ORDER — GABAPENTIN 300 MG PO CAPS: 300.0000 mg | ORAL_CAPSULE | Freq: Every day | ORAL | Status: DC

## 2020-08-19 MED ORDER — PREGABALIN 50 MG PO CAPS
100.0000 mg | ORAL_CAPSULE | Freq: Two times a day (BID) | ORAL | Status: DC
  Administered 2020-08-19 – 2020-08-20 (×2): 100 mg via ORAL
  Filled 2020-08-19 (×2): qty 2

## 2020-08-19 MED ORDER — OXYCODONE HCL 5 MG PO TABS
5.0000 mg | ORAL_TABLET | ORAL | Status: DC | PRN
  Administered 2020-08-19: 5 mg via ORAL
  Administered 2020-08-20: 10 mg via ORAL
  Filled 2020-08-19: qty 1
  Filled 2020-08-19: qty 2
  Filled 2020-08-19: qty 1

## 2020-08-19 MED ORDER — HYDROCODONE-ACETAMINOPHEN 5-325 MG PO TABS
1.0000 | ORAL_TABLET | ORAL | Status: DC | PRN
  Administered 2020-08-20: 2 via ORAL
  Filled 2020-08-19: qty 2

## 2020-08-19 NOTE — Progress Notes (Signed)
[facility] PHYSICAL MEDICINE & REHABILITATION PROGRESS NOTE   Subjective/Complaints:   Has appointment to get staples removed Friday at 1pm- wants to leave by 10am Friday.  Having horrific nerve pain in RLE this AM= lasted 20 minutes- asking to increase nerve pain meds.  Plans on having BM today   ROS:   Pt denies SOB, abd pain, CP, N/V/C/D, and vision changes   Objective:   No results found. Recent Labs    08/17/20 1029  WBC 9.4  HGB 9.8*  HCT 32.0*  PLT 397   Recent Labs    08/17/20 0546  NA 143  K 3.9  CL 106  CO2 26  GLUCOSE 108*  BUN 34*  CREATININE 1.26*  CALCIUM 9.2    Intake/Output Summary (Last 24 hours) at 08/19/2020 0854 Last data filed at 08/19/2020 0700 Gross per 24 hour  Intake 960 ml  Output 1275 ml  Net -315 ml        Physical Exam: Vital Signs Blood pressure (!) 145/63, pulse 67, temperature 97.6 F (36.4 C), resp. rate 19, height 5\' 10"  (1.778 m), weight 98.6 kg, SpO2 97 %.   Constitutional: sitting up in bed, appropriate, NAD, rubbing RLE, NAD HEENT: EOMI, oral membranes moist Neck: supple Cardiovascular: RRR Respiratory/Chest: CTA B/L- no W/R/R- good air movement GI/Abdomen: Soft, NT, ND, (+)BS  Ext: no clubbing, cyanosis, or edema Psych: pleasant and cooperative still- doing well Musculoskeletal:     Comments: TTP R shoulder, sensitive to touch and back of neck around incision. TTP over R shoulder as well- hurting this AM- hasn't gotten AM meds yet Skin:    General: Skin is warm and dry. Posterior neck incision looks great- no erythema, no drainage, no swelling- no change Neurological: Ox3 Bilateral upper extremities: 5/5 proximal distal Right lower extremity: 5/5 proximal distal Left lower extremity: Hip flexion 4/5, knee extension 4+/5, dorsiflexion 3+/5 Sensation diminished to light touch in bilateral hands and left foot--stable    Assessment/Plan: 1. Functional deficits which require 3+ hours per day of  interdisciplinary therapy in a comprehensive inpatient rehab setting.  Physiatrist is providing close team supervision and 24 hour management of active medical problems listed below.  Physiatrist and rehab team continue to assess barriers to discharge/monitor patient progress toward functional and medical goals  Care Tool:  Bathing    Body parts bathed by patient: Right arm, Left arm, Chest, Abdomen, Front perineal area, Right upper leg, Left upper leg, Buttocks, Face   Body parts bathed by helper: Right lower leg, Left lower leg     Bathing assist Assist Level: Minimal Assistance - Patient > 75%     Upper Body Dressing/Undressing Upper body dressing   What is the patient wearing?: Pull over shirt, Orthosis    Upper body assist Assist Level: Supervision/Verbal cueing    Lower Body Dressing/Undressing Lower body dressing      What is the patient wearing?: Underwear/pull up, Pants     Lower body assist Assist for lower body dressing: Contact Guard/Touching assist     Toileting Toileting    Toileting assist Assist for toileting: Contact Guard/Touching assist Assistive Device Comment: urinal   Transfers Chair/bed transfer  Transfers assist     Chair/bed transfer assist level: Contact Guard/Touching assist Chair/bed transfer assistive device: Programmer, multimedia   Ambulation assist      Assist level: Contact Guard/Touching assist Assistive device: Walker-rolling Max distance: 150'   Walk 10 feet activity   Assist  Assist level: Contact Guard/Touching assist Assistive device: Walker-rolling   Walk 50 feet activity   Assist Walk 50 feet with 2 turns activity did not occur: Safety/medical concerns  Assist level: Contact Guard/Touching assist Assistive device: Walker-rolling    Walk 150 feet activity   Assist Walk 150 feet activity did not occur: Safety/medical concerns  Assist level: Contact Guard/Touching assist Assistive  device: Walker-rolling    Walk 10 feet on uneven surface  activity   Assist Walk 10 feet on uneven surfaces activity did not occur: Safety/medical concerns         Wheelchair     Assist Will patient use wheelchair at discharge?: No   Wheelchair activity did not occur: N/A         Wheelchair 50 feet with 2 turns activity    Assist    Wheelchair 50 feet with 2 turns activity did not occur: N/A       Wheelchair 150 feet activity     Assist  Wheelchair 150 feet activity did not occur: N/A       Blood pressure (!) 145/63, pulse 67, temperature 97.6 F (36.4 C), resp. rate 19, height 5\' 10"  (1.778 m), weight 98.6 kg, SpO2 97 %.  Medical Problem List and Plan: 1.  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   12/3- reverified this to pt- about cervical collar as above.              -ELOS/Goals: 10-14 days/supervision/mod I             continue CIR PT, OT 2.  Antithrombotics: -DVT/anticoagulation:  Pharmaceutical: Lovenox             -antiplatelet therapy: N/A 3. Pain Management: Now on tylenol prn as well as  Celebrex bid DC'd.             Continue gabapentin 300 mg tid, adjust as necessary             Oxycodone prn.  12/2- will add Lyrica 50 mg BID and con't gabapentin for now, for burning- will switch over to Lyrica if helpful.    12/4- pt reports improvement in pain.  12/6- reports nerve pain doing somewhat better- con't Lyrica 12/7- no meds yet this AM- so hurting- will ask nursing to give- con't regimen  12/8- will increase Lyrica to 100 mg BID and decrease gabapentin to 300 mg QHS- plan on being off Gabapentin by d/c, if possible  Continue current reg 4. Mood: LCSW to follow for evaluation and support.              -antipsychotic  agents: N/A 5. Neuropsych: This patient is capable of making decisions on his own behalf. 6. Skin/Wound Care: Monitor wound for healing.  7. Fluids/Electrolytes/Nutrition: Monitor CMP ordered for tomorrow a.m. 8. COPD/OSA: Uses oxygen 2 L at bedime per records.   12/6- only used at home 2-3x/week- sats >95% 9. CKD stage 3: Baseline SCr around 1.39-1.5 in the past couple of months. 1.39 on 11/18.              CMP 12/1 wnl.  12/6- Cr 1.26 and BUN 34- pretty stable 10. MG: Has been stable on Imuran 150 mg/day and Mestinon 60 mg bid.  12/2- stable- con't regimen  12/6- no Sx's worsening per pt             Regulate therapies if necessary 11. Anemia of chronic disease: Pre-op H/H- 10.4/32.7.              CBC ordered for tomorrow a.m.  12/6- Hb 9.8- not bad considering surgery- con't Iron- monitor for continued black stools- if occurs again, will check Occult blood  12/7- black stool again- will check for occult blood- Hb stable  12/8- hasn't had BM yet- will order stool cards 12.  Hypertension: Monitor             Cozaar  12/4 bp controlled  12/7- BP controlled- 133/66- con't regimen   13.  Leucocytosis: WBC 10.7 on 11/18.                14. RLS: managed by ropinirole at bedtime.  15. Constipation  12/2- will add Colace 100 mg BID for now.   12/4 last bm 12/1 16. Insomnia  12.6- will give trazodone 25 mg QHS.  12/7- slept better- con't regimen   LOS: 7 days A FACE TO FACE EVALUATION WAS PERFORMED  Liona Wengert 08/19/2020, 8:54 AM

## 2020-08-19 NOTE — Progress Notes (Signed)
Occupational Therapy Session Note  Patient Details  Name: Francisco [name] MRN: [id number] Date of Birth: [birth year]  Today's Date: 08/19/2020 OT Individual Time: 0700-0730 OT Individual Time Calculation (min): 30 min  and Today's Date: 08/19/2020 OT Missed Time: 79 Minutes Missed Time Reason: Pain   Short Term Goals: Week 1:  OT Short Term Goal 1 (Week 1): STG=LTG d/t ELOS  Skilled Therapeutic Interventions/Progress Updates:    Pt seated EOB upon arrival eating breakfast. Pt requested to change his brief.  Pt experienced excruciating pain in R hip radiating distally when standing. Pt unable to doff or don pants 2/2 pain and requiring BUE support when standing. Pt stood X 3 with pain experienced each occurrence. Pt returned to supine but pt continued. PA Dan notifed and meds requested from RN. Pt commented that he has experienced this "type of pain" before but usually in BLE. Pt remained in bed with all needs within reach and bed alarm activated. Pt missed 45 mins skilled OT services.  Therapy Documentation Precautions:  Precautions Precautions: Cervical, Fall Precaution Booklet Issued: Yes (comment) Precaution Comments: no bending, lifting >10lbs, twisting, arching Required Braces or Orthoses: Cervical Brace Cervical Brace: At all times, Hard collar Restrictions Weight Bearing Restrictions: No Other Position/Activity Restrictions: Pt stating today MD said he only needed to wear brace when walking/transfering and could take it off in chair. General: General OT Amount of Missed Time: 45 Minutes Pain:  Pt reports 10/10 pain in R hip radiating to foot (stabbing pain); returned to bed and repositioned, PA Dan notified, meds requested from RN   Therapy/Group: Individual Therapy  [name] 08/19/2020, 7:40 AM

## 2020-08-19 NOTE — Progress Notes (Signed)
Patient ID: Francisco [name], male   DOB: Jan 28, 1946, 74 y.o.   MRN: [id number]  Per therapy, the team would like wife to come in for family education. SW spoke with [name] ([phone]) to discuss if she is still able to come in from 1pm-3pm tomorrow. Wife in agreement.   SW faxed outpatient PT/OT referral to [facility] ([phone]/f:[phone]).   SW updated pt on above.   Loralee Pacas, MSW, [place] Office: [phone] Cell: [phone] Fax: [phone]

## 2020-08-19 NOTE — Progress Notes (Signed)
Physical Therapy Session Note  Patient Details  Name: Francisco [name] MRN: [id number] Date of Birth: 1946/05/02  Today's Date: 08/19/2020 PT Individual Time: 1035-1130 PT Individual Time Calculation (min): 55 min   Short Term Goals: Week 1:  PT Short Term Goal 1 (Week 1): pt to demonstrate CGA for supine<>sit PT Short Term Goal 2 (Week 1): pt to demonstrate CGA for transfers with LRAD PT Short Term Goal 3 (Week 1): pt to demonstrate gait with LRAD 50' CGA PT Short Term Goal 4 (Week 1): pt to demonstrate stairs one rail CGA  Skilled Therapeutic Interventions/Progress Updates: Pt presented in bed with wife present agreeable to therapy. Pt c/o mild pain in R knee to hip which was pt states was residual from bout of nerve pain from am. Pt performed bed mobility with supervision and performed STS from EOB with CGA to "test" how RLE feels in wt bearing. Pt able to tolerate so agreeable to transfer to w/c to transport to rehab gym. Pt then participated in block practice transfers to curb to simulate getting to/from back porch. Pt was able to demonstrate safe sequencing with initially verbal cues to step closer to RW when descending down step. Pt was able to perform x 4 curbs with CGA overall. Pt did indicate some RLE weakness with mild posterior lean during first descent to step which pt was able to recover with CGA. Pt then ambulated to high/low mat and participated in balance/coordination activities including toe taps to 4in step and toe taps to cones. Pt was able to successfully coordinate LLE to target without hitting cones to all targets however noted mild instability at R knee. Once completed pt performed ambulatory transfer to w/c and transported back to room. Pt requesting to use bathroom prior to end of session, performed ambulatory transfer to toilet and toilet transfers with CGA (+void). Once completed performed STS with close S and performed LB clothing management with CGA. Pt ambulated back to  w/c and remained in chair with call bell within reach and wife present.      Therapy Documentation Precautions:  Precautions Precautions: Cervical, Fall Precaution Booklet Issued: Yes (comment) Precaution Comments: no bending, lifting >10lbs, twisting, arching Required Braces or Orthoses: Cervical Brace Cervical Brace: At all times, Hard collar Restrictions Weight Bearing Restrictions: No Other Position/Activity Restrictions: Pt stating today MD said he only needed to wear brace when walking/transfering and could take it off in chair. General:   Vital Signs: Therapy Vitals Temp: 97.9 F (36.6 C) Pulse Rate: 70 Resp: 19 BP: (!) 115/47 Patient Position (if appropriate): Lying Oxygen Therapy SpO2: 98 % O2 Device: Room Air Pain: Pain Assessment Pain Score: 2    Therapy/Group: Individual Therapy  Tate Zagal  Marilee Ditommaso, PTA  08/19/2020, 3:58 PM

## 2020-08-19 NOTE — Progress Notes (Signed)
Occupational Therapy Session Note  Patient Details  Name: Francisco [name] MRN: [id number] Date of Birth: 07/09/1946  Today's Date: 08/19/2020 OT Individual Time: 1300-1400 OT Individual Time Calculation (min): 60 min    Short Term Goals: Week 1:  OT Short Term Goal 1 (Week 1): STG=LTG d/t ELOS  Skilled Therapeutic Interventions/Progress Updates:    Pt resting in w/c upon arrival with wife present. Demonstrated to wife how to remove and replace pads for cervical collar. Educated wife on cleaning pads. OT intervention with focus on BLE therex and standing balance. 10 mins Nustep with BLE only-3 mins level 6, 3 mins level 5, and 4 mins level 4. Pt also engaged in standing balance activities at BITS-3 min X 3 with CGA. Pt attempted to stand on Airex pad but unable to maintain balance. Pt returned to room and remained in w/c with all needs within reach. Wife present.   Therapy Documentation Precautions:  Precautions Precautions: Cervical, Fall Precaution Booklet Issued: Yes (comment) Precaution Comments: no bending, lifting >10lbs, twisting, arching Required Braces or Orthoses: Cervical Brace Cervical Brace: At all times, Hard collar Restrictions Weight Bearing Restrictions: No Other Position/Activity Restrictions: Pt stating today MD said he only needed to wear brace when walking/transfering and could take it off in chair. Pain:  Pt c/o "some soreness/pain" on lateral aspect of R knee; activity, emotional support   Therapy/Group: Individual Therapy  [name] 08/19/2020, 2:48 PM

## 2020-08-20 ENCOUNTER — Encounter (HOSPITAL_COMMUNITY): Payer: Medicare Other

## 2020-08-20 ENCOUNTER — Ambulatory Visit (HOSPITAL_COMMUNITY): Payer: Medicare Other | Admitting: Physical Therapy

## 2020-08-20 ENCOUNTER — Inpatient Hospital Stay (HOSPITAL_COMMUNITY): Payer: Medicare Other | Admitting: Physical Therapy

## 2020-08-20 ENCOUNTER — Inpatient Hospital Stay (HOSPITAL_COMMUNITY): Payer: Medicare Other | Attending: Physician Assistant

## 2020-08-20 MED ORDER — PREGABALIN 100 MG PO CAPS: 100.0000 mg | ORAL_CAPSULE | Freq: Two times a day (BID) | ORAL | 0 refills | Status: DC

## 2020-08-20 MED ORDER — POLYSACCHARIDE IRON COMPLEX 150 MG PO CAPS: 150.0000 mg | ORAL_CAPSULE | Freq: Two times a day (BID) | ORAL | 0 refills | Status: DC

## 2020-08-20 MED ORDER — POLYETHYLENE GLYCOL 3350 17 G PO PACK: 17.0000 g | PACK | Freq: Every day | ORAL | 0 refills | Status: DC

## 2020-08-20 MED ORDER — TRAZODONE HCL 50 MG PO TABS: 25.0000 mg | ORAL_TABLET | Freq: Every evening | ORAL | 0 refills | Status: DC | PRN

## 2020-08-20 MED ORDER — SENNA 8.6 MG PO TABS: 2.0000 | ORAL_TABLET | Freq: Every day | ORAL | 0 refills | Status: DC

## 2020-08-20 MED ORDER — SORBITOL 70 % SOLN
45.0000 mL | Freq: Once | Status: AC
  Administered 2020-08-20: 45 mL via ORAL
  Filled 2020-08-20: qty 60

## 2020-08-20 MED ORDER — MAGNESIUM OXIDE 400 (241.3 MG) MG PO TABS: 400.0000 mg | ORAL_TABLET | Freq: Every day | ORAL | 0 refills | Status: DC

## 2020-08-20 NOTE — Progress Notes (Signed)
[facility] [facility] PHYSICAL MEDICINE & REHABILITATION PROGRESS NOTE   Subjective/Complaints:  Didn't have BM- asking if there's something else he can take,  Also had incident with nursing last night taking a long time to get to him-  Also still having nerve pain in RLE_ just started abruptly this AM- sharp/stabbing- increased Lyrica for this yesterday.   Now, asking to d/c this afternoon after therapy.  SW will call wife to see if she's OK with it- she is and will d/c today.    ROS:   Pt denies SOB, abd pain, CP, N/V/C/D, and vision changes  Objective:   No results found. Recent Labs    08/17/20 1029  WBC 9.4  HGB 9.8*  HCT 32.0*  PLT 397   No results for input(s): NA, K, CL, CO2, GLUCOSE, BUN, CREATININE, CALCIUM in the last 72 hours.  Intake/Output Summary (Last 24 hours) at 08/20/2020 0920 Last data filed at 08/20/2020 0700 Gross per 24 hour  Intake 690 ml  Output 1025 ml  Net -335 ml        Physical Exam: Vital Signs Blood pressure (!) 150/62, pulse 69, temperature 97.6 F (36.4 C), temperature source Oral, resp. rate 18, height 5\' 10"  (1.778 m), weight 98.6 kg, SpO2 98 %.   Constitutional: sitting up in bedside chair, appropriate, NAD HEENT: EOMI, oral membranes moist Neck: supple Cardiovascular: RRR Respiratory/Chest: CTA B/L- no W/R/R- good air movement GI/Abdomen: Soft, NT, ND, (+)BS -hypoactive- maybe slightly distended Ext: no clubbing, cyanosis, or edema Psych: pleasant and cooperative still- doing well Musculoskeletal:     Comments: TTP R shoulder, sensitive to touch and back of neck around incision. TTP over R shoulder as well- hurting this AM- hasn't gotten AM meds yet Skin:    General: Skin is warm and dry. Posterior neck incision looks great- no erythema, no drainage, no swelling- no change Neurological: Ox3 Bilateral upper extremities: 5/5 proximal distal Right lower extremity: 5/5 proximal distal Left lower extremity: Hip flexion 4/5, knee  extension 4+/5, dorsiflexion 3+/5 Sensation diminished to light touch in bilateral hands and left foot--stable    Assessment/Plan: 1. Functional deficits which require 3+ hours per day of interdisciplinary therapy in a comprehensive inpatient rehab setting.  Physiatrist is providing close team supervision and 24 hour management of active medical problems listed below.  Physiatrist and rehab team continue to assess barriers to discharge/monitor patient progress toward functional and medical goals  Care Tool:  Bathing    Body parts bathed by patient: Right arm,Left arm,Chest,Abdomen,Front perineal area,Right upper leg,Left upper leg,Buttocks,Face,Right lower leg,Left lower leg   Body parts bathed by helper: Right lower leg,Left lower leg     Bathing assist Assist Level: Supervision/Verbal cueing     Upper Body Dressing/Undressing Upper body dressing   What is the patient wearing?: Pull over shirt,Orthosis    Upper body assist Assist Level: Supervision/Verbal cueing    Lower Body Dressing/Undressing Lower body dressing      What is the patient wearing?: Underwear/pull up,Pants     Lower body assist Assist for lower body dressing: Supervision/Verbal cueing     Toileting Toileting    Toileting assist Assist for toileting: Supervision/Verbal cueing Assistive Device Comment: urinal   Transfers Chair/bed transfer  Transfers assist     Chair/bed transfer assist level: Contact Guard/Touching assist Chair/bed transfer assistive device: Programmer, multimedia   Ambulation assist      Assist level: Contact Guard/Touching assist Assistive device: Walker-rolling Max distance: 150'   Walk 10  feet activity   Assist     Assist level: Contact Guard/Touching assist Assistive device: Walker-rolling   Walk 50 feet activity   Assist Walk 50 feet with 2 turns activity did not occur: Safety/medical concerns  Assist level: Contact Guard/Touching  assist Assistive device: Walker-rolling    Walk 150 feet activity   Assist Walk 150 feet activity did not occur: Safety/medical concerns  Assist level: Contact Guard/Touching assist Assistive device: Walker-rolling    Walk 10 feet on uneven surface  activity   Assist Walk 10 feet on uneven surfaces activity did not occur: Safety/medical concerns         Wheelchair     Assist Will patient use wheelchair at discharge?: No   Wheelchair activity did not occur: N/A         Wheelchair 50 feet with 2 turns activity    Assist    Wheelchair 50 feet with 2 turns activity did not occur: N/A       Wheelchair 150 feet activity     Assist  Wheelchair 150 feet activity did not occur: N/A       Blood pressure (!) 150/62, pulse 69, temperature 97.6 F (36.4 C), temperature source Oral, resp. rate 18, height 5\' 10"  (1.778 m), weight 98.6 kg, SpO2 98 %.  Medical Problem List and Plan: 1.  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   12/3- reverified this to pt- about cervical collar as above.   12/9- pt insistent on going home today after therapy- will do so after family training this PM             -ELOS/Goals: 10-14 days/supervision/mod I             continue CIR PT, OT 2.  Antithrombotics: -DVT/anticoagulation:  Pharmaceutical: Lovenox             -antiplatelet therapy: N/A 3. Pain Management: Now on tylenol prn as well as  Celebrex bid DC'd.             Continue gabapentin 300 mg tid, adjust as necessary             Oxycodone prn.  12/2- will add Lyrica 50 mg BID and con't gabapentin for now, for burning- will switch over to Lyrica if helpful.    12/4- pt reports improvement in pain.  12/6- reports nerve pain doing somewhat better- con't  Lyrica 12/7- no meds yet this AM- so hurting- will ask nursing to give- con't regimen  12/8- will increase Lyrica to 100 mg BID and decrease gabapentin to 300 mg QHS- plan on being off Gabapentin by d/c, if possible  12/9- will stop gabapentin and send home on Lyrica 100 mg BID  Continue current reg 4. Mood: LCSW to follow for evaluation and support.              -antipsychotic agents: N/A 5. Neuropsych: This patient is capable of making decisions on his own behalf. 6. Skin/Wound Care: Monitor wound for healing.  7. Fluids/Electrolytes/Nutrition: Monitor CMP ordered for tomorrow a.m. 8. COPD/OSA: Uses oxygen 2 L at bedime per records.   12/6- only used at home 2-3x/week- sats >95% 9. CKD stage 3: Baseline SCr around 1.39-1.5 in the past couple of months. 1.39  on 11/18.              CMP 12/1 wnl.  12/6- Cr 1.26 and BUN 34- pretty stable 10. MG: Has been stable on Imuran 150 mg/day and Mestinon 60 mg bid.    12/2- stable- con't regimen  12/6- no Sx's worsening per pt             Regulate therapies if necessary 11. Anemia of chronic disease: Pre-op H/H- 10.4/32.7.              CBC ordered for tomorrow a.m.  12/6- Hb 9.8- not bad considering surgery- con't Iron- monitor for continued black stools- if occurs again, will check Occult blood  12/7- black stool again- will check for occult blood- Hb stable  12/8- hasn't had BM yet- will order stool cards  12/9- still hasn't gone- waiting for BM 12.  Hypertension: Monitor             Cozaar  12/4 bp controlled  12/7- BP controlled- 133/66- con't regimen   13.  Leucocytosis: WBC 10.7 on 11/18.                14. RLS: managed by ropinirole at bedtime.  15. Constipation  12/2- will add Colace 100 mg BID for now.   12/4 last bm 12/1  12/9- LBM 3 days ago- will give Sorbitol before d/c this AM so can get cleaned out before d/c- 45 G 16. Insomnia  12.6- will give trazodone 25 mg QHS.  12/7- slept better- con't regimen   LOS: 8 days A FACE TO  FACE EVALUATION WAS PERFORMED  Lijah Bourque 08/20/2020, 9:20 AM

## 2020-08-20 NOTE — Progress Notes (Signed)
Occupational Therapy Session Note  Patient Details  Name: Francisco [name] MRN: [id number] Date of Birth: 01-Jul-1946  Today's Date: 08/20/2020 OT Individual Time: 8295-6213 OT Individual Time Calculation (min): 75 min    Short Term Goals: Week 1:  OT Short Term Goal 1 (Week 1): STG=LTG d/t ELOS  Skilled Therapeutic Interventions/Progress Updates:    OT intervention with focus on functional amb with RW, bathing at shower level, dressing with sit<>stand from EOB, functional transfers, standing balance, activity tolerance, and safety awareness to increase independence with BADLs and prepare for discharge. Functional transfers and amb with RW at supervision level. Pt completed bathing/dressing with supervision using AE appropriately to assist. Pt issued walker bag and reacher bag. Pt amb in day room to gather items from floor with reacher. Pt c/o intense R lateral knee pain during activity. Pt transported back to room and remained seated in w/c with all needs within reach. See Care Tool for assist levels.   Therapy Documentation Precautions:  Precautions Precautions: Cervical,Fall Precaution Booklet Issued: Yes (comment) Precaution Comments: no bending, lifting >10lbs, twisting, arching Required Braces or Orthoses: Cervical Brace Cervical Brace: At all times,Hard collar Restrictions Weight Bearing Restrictions: No Other Position/Activity Restrictions: Pt stating today MD said he only needed to wear brace when walking/transfering and could take it off in chair. Pain:  Pt c/o 8/10 in R lateral knne with activity; RN and MD aware   Therapy/Group: Individual Therapy  [name] 08/20/2020, 8:18 AM

## 2020-08-20 NOTE — Progress Notes (Signed)
Inpatient Rehabilitation Care Coordinator  Discharge Note  The overall goal for the admission was met for:   Discharge location: Yes. D/c to home with wife who will provide 24/7 care.   Length of Stay: Yes. 8 days.   Discharge activity level: Yes. Sup to Mod I  Home/community participation: Yes. Limited.   Services provided included: MD, RD, PT, OT, RN, CM, TR, Pharmacy, Neuropsych and SW  Financial Services: Private Insurance: [facility] Medicare  Follow-up services arranged: Outpatient: [facility] for PT/OT and DME: No DME recommended  Comments (or additional information): contact pt #[phone] or pt wife [name] [phone]  Patient/Family verbalized understanding of follow-up arrangements: Yes  Individual responsible for coordination of the follow-up plan: Pt to have assistance with care needs.   Confirmed correct DME delivered: Rana Snare 08/20/2020    Loralee Pacas, MSW, [place] Office: [phone] Cell: [phone] Fax: [phone]

## 2020-08-20 NOTE — Progress Notes (Signed)
Pt discharged to home, accompanied by his spouse. Wanted to get home before dark.

## 2020-08-20 NOTE — Progress Notes (Signed)
Physical Therapy Discharge Summary  Patient Details  Name: Francisco [name] MRN: [id number] Date of Birth: 1946-06-25  Today's Date: 08/20/2020  Patient has met 10 of 10 long term goals due to improved activity tolerance, improved balance, improved postural control, increased strength and ability to compensate for deficits.  Patient to discharge at an ambulatory level Supervision.   Patient's care partner is independent to provide the necessary physical assistance at discharge. Pt's wife has completed family education and is safe to assist pt upon d/c home.  Reasons goals not met: Pt has met all rehab goals.  Recommendation:  Patient will benefit from ongoing skilled PT services in outpatient setting to continue to advance safe functional mobility, address ongoing impairments in endurance, strength, coordination, balance, safety, independence with functional mobility, and minimize fall risk.  Equipment: No equipment provided. Pt already owns RW.  Reasons for discharge: treatment goals met and discharge from hospital  Patient/family agrees with progress made and goals achieved: Yes  PT Discharge Precautions/Restrictions Precautions Precautions: Cervical;Fall Precaution Comments: no bending, lifting >10lbs, twisting, arching Required Braces or Orthoses: Cervical Brace Cervical Brace: At all times;Hard collar Restrictions Weight Bearing Restrictions: No Vision/Perception  Perception Perception: Within Functional Limits Praxis Praxis: Intact  Cognition Overall Cognitive Status: Within Functional Limits for tasks assessed Arousal/Alertness: Awake/alert Orientation Level: Oriented X4 Attention: Focused;Sustained Focused Attention: Appears intact Sustained Attention: Appears intact Memory: Appears intact Awareness: Appears intact Problem Solving: Appears intact Safety/Judgment: Appears intact Sensation Sensation Light Touch: Impaired Detail Light Touch Impaired Details:  Impaired RLE;Impaired LLE Proprioception: Impaired Detail Proprioception Impaired Details: Impaired RLE;Impaired LLE Coordination Gross Motor Movements are Fluid and Coordinated: Yes Fine Motor Movements are Fluid and Coordinated: Yes Motor  Motor Motor: Within Functional Limits Motor - Discharge Observations: [facility]  Mobility Bed Mobility Bed Mobility: Rolling Right;Rolling Left;Supine to Sit;Sit to Supine Rolling Right: Independent Rolling Left: Independent Supine to Sit: Independent Sit to Supine: Independent Transfers Transfers: Stand Pivot Transfers;Sit to Stand;Stand to Sit Sit to Stand: Supervision/Verbal cueing Stand to Sit: Supervision/Verbal cueing Stand Pivot Transfers: Supervision/Verbal cueing Stand Pivot Transfer Details: Verbal cues for precautions/safety;Verbal cues for safe use of DME/AE;Verbal cues for sequencing Transfer (Assistive device): Rolling walker Locomotion  Gait Ambulation: Yes Gait Assistance: Supervision/Verbal cueing Gait Distance (Feet): 200 Feet Assistive device: Rolling walker Gait Assistance Details: Verbal cues for sequencing;Verbal cues for precautions/safety;Verbal cues for safe use of DME/AE;Verbal cues for technique Gait Gait: Yes Gait Pattern: Impaired Gait Pattern: Decreased step length - right;Decreased step length - left;Trunk flexed;Poor foot clearance - left Gait velocity: decreased Stairs / Additional Locomotion Stairs: Yes Stairs Assistance: Supervision/Verbal cueing Stair Management Technique: One rail Left;Step to pattern Number of Stairs: 12 Height of Stairs: 6 Ramp: Supervision/Verbal cueing Curb: Supervision/Verbal cueing Wheelchair Mobility Wheelchair Mobility: No  Trunk/Postural Assessment  Cervical Assessment Cervical Assessment: Exceptions to [facility] (cervical collar) Thoracic Assessment Thoracic Assessment: Exceptions to [facility] (kyphotic) Lumbar Assessment Lumbar Assessment: Exceptions to [facility] (posterior pelvic  tilt) Postural Control Postural Control: Deficits on evaluation  Balance Balance Balance Assessed: Yes Dynamic Sitting Balance Dynamic Sitting - Balance Support: During functional activity Dynamic Sitting - Level of Assistance: 6: Modified independent (Device/Increase time) Static Standing Balance Static Standing - Balance Support: Bilateral upper extremity supported;During functional activity Static Standing - Level of Assistance: 5: Stand by assistance Extremity Assessment  RUE Assessment RUE Assessment: Exceptions to [facility] General Strength Comments: no formal MMT 2/2 cervical precautions LUE Assessment LUE Assessment: Exceptions to [facility] General Strength Comments: no formal MMT 2/2 cervical precautions  RLE Assessment RLE Assessment: Within Functional Limits General Strength Comments: hip flexion 4/5; all others 5/5 LLE Assessment LLE Assessment: Exceptions to [facility] General Strength Comments: hip flexion 4/5; knee flex/ext 5/5; ankle DF 2-/5     Excell Seltzer, PT, DPT 08/20/2020, 5:57 PM

## 2020-08-20 NOTE — Progress Notes (Signed)
Patient ID: Francisco [name], male   DOB: 1945/12/13, 74 y.o.   MRN: [id number]  Per attending, pt has requested to d/c to home today after his therapies. SW spoke with pt wife [name] who is in agreement, and SW informed on outpatient PT/OT referral sent to [facility] met with pt in room to discuss above; pt on video chat with his wife at time of visit. Wife intends to be present for family edu. No DME needed for pt. SW updated medical team on changes.   Loralee Pacas, MSW, [place] Office: [phone] Cell: [phone] Fax: [phone]

## 2020-08-20 NOTE — Discharge Instructions (Signed)
Inpatient Rehab Discharge Instructions  Francisco [name] Discharge date and time: 08/20/20   Activities/Precautions/ Functional Status: Activity: no lifting, driving, or strenuous exercise  till cleared by MD Diet: regular diet Wound Care: keep wound clean and dry   Functional status:  ___ No restrictions     ___ Walk up steps independently _X__ 24/7 supervision/assistance   ___ Walk up steps with assistance ___ Intermittent supervision/assistance  ___ Bathe/dress independently ___ Walk with walker     ___ Bathe/dress with assistance ___ Walk Independently    ___ Shower independently ___ Walk with assistance    _X__ Shower with assistance _X__ No alcohol     ___ Return to work/school ________   Special Instructions:  COMMUNITY REFERRALS UPON DISCHARGE:     Outpatient: PT       OT             Agency: [facility]  Phone: [phone]             Appointment Date/Time:*Please expect follow-up within 7-10 business days to schedule your appointment. If you have not received follow-up, be sure to contact the site directly.*  Medical Equipment/Items Ordered: no equipment recommended                                                 Agency/Supplier: N/A     My questions have been answered and I understand these instructions. I will adhere to these goals and the provided educational materials after my discharge from the hospital.  Patient/Caregiver Signature _______________________________ Date __________  Clinician Signature _______________________________________ Date __________  Please bring this form and your medication list with you to all your follow-up doctor's appointments.

## 2020-08-20 NOTE — Progress Notes (Signed)
Occupational Therapy Session Note  Patient Details  Name: Francisco [name] MRN: [id number] Date of Birth: 09-27-1945  Today's Date: 08/20/2020 OT Individual Time: 1300-1345 OT Individual Time Calculation (min): 45 min    Short Term Goals: Week 1:  OT Short Term Goal 1 (Week 1): STG=LTG d/t ELOS  Skilled Therapeutic Interventions/Progress Updates:    Pt on toilet upon arrival with wife present in room for education. Pt unable to have a BM. RN entered room and conferred with pt regarding options. Toileting with supervision. Discussed safety recommendations with wife and pt. Pt demonstrated use of sock aid and pt's wife was able to find one on [employer]. Pt has reachers at home. Pt demonstrated walk in shower transfersX2. Discussed bathroom/shower layout. Recommended pt completed dry runX2 before taking a shower. Reviewed cervical precautions. Pt and wife pleased with progress and ready for discharge home.   Therapy Documentation Precautions:  Precautions Precautions: Cervical,Fall Precaution Booklet Issued: Yes (comment) Precaution Comments: no bending, lifting >10lbs, twisting, arching Required Braces or Orthoses: Cervical Brace Cervical Brace: At all times,Hard collar Restrictions Weight Bearing Restrictions: No Other Position/Activity Restrictions: Pt stating today MD said he only needed to wear brace when walking/transfering and could take it off in chair. Pain:  Pt denies pain this afternoon   Therapy/Group: Individual Therapy  [name] 08/20/2020, 2:15 PM

## 2020-08-20 NOTE — Progress Notes (Signed)
Occupational Therapy Discharge Summary  Patient Details  Name: Francisco [name] MRN: [id number] Date of Birth: 1946-02-22  Patient has met 11 of 11 long term goals due to improved activity tolerance, improved balance, postural control and improved coordination.  Pt made good progress with BADLs and functional transfers/amb with RW during this admission.  Pt completes all transfers and bathing/dressing tasks with supervision using AE and DME appropriately. Pt's wife has been present for education and demonstrates appropriate supervision as needed. Patient to discharge at overall Supervision level.  Patient's care partner is independent to provide the necessary physical assistance at discharge.      Recommendation:  Patient will benefit from ongoing skilled OT services in outpatient setting to continue to advance functional skills in the area of BADL, iADL and Reduce care partner burden.  Equipment: No equipment provided  Reasons for discharge: treatment goals met and discharge from hospital  Patient/family agrees with progress made and goals achieved: Yes  OT Discharge Vision Baseline Vision/History: Wears glasses Wears Glasses: Reading only Patient Visual Report: No change from baseline Vision Assessment?: No apparent visual deficits Perception  Perception: Within Functional Limits Praxis Praxis: Intact Cognition Overall Cognitive Status: Within Functional Limits for tasks assessed Arousal/Alertness: Awake/alert Orientation Level: Oriented X4 Attention: Focused;Sustained Focused Attention: Appears intact Sustained Attention: Appears intact Memory: Appears intact Awareness: Appears intact Problem Solving: Appears intact Safety/Judgment: Appears intact Sensation Sensation Light Touch: Impaired Detail Light Touch Impaired Details: Impaired RLE;Impaired LLE Hot/Cold: Appears Intact Proprioception: Impaired Detail Proprioception Impaired Details: Impaired RLE;Impaired  LLE Stereognosis: Not tested Coordination Gross Motor Movements are Fluid and Coordinated: Yes Fine Motor Movements are Fluid and Coordinated: Yes Motor  Motor Motor: Within Functional Limits    Trunk/Postural Assessment  Cervical Assessment Cervical Assessment:  (cervical collar) Thoracic Assessment Thoracic Assessment:  (kyphotic) Lumbar Assessment Lumbar Assessment:  (posterior pelvic tilt)  Balance Dynamic Sitting Balance Dynamic Sitting - Balance Support: During functional activity Dynamic Sitting - Level of Assistance: 6: Modified independent (Device/Increase time) Extremity/Trunk Assessment RUE Assessment RUE Assessment: Exceptions to [facility] General Strength Comments: no formal MMT 2/2 cervical precautions LUE Assessment LUE Assessment: Exceptions to [facility] General Strength Comments: no formal MMT 2/2 cervical precautions   [name] 08/20/2020, 11:44 AM

## 2020-08-20 NOTE — Progress Notes (Signed)
Physical Therapy Session Note  Patient Details  Name: Francisco [name] MRN: [id number] Date of Birth: 1945-10-27  Today's Date: 08/20/2020 PT Individual Time: 8270-7867; 1400-1445 PT Individual Time Calculation (min): 45 min and 45 min  Short Term Goals: Week 1:  PT Short Term Goal 1 (Week 1): pt to demonstrate CGA for supine<>sit PT Short Term Goal 2 (Week 1): pt to demonstrate CGA for transfers with LRAD PT Short Term Goal 3 (Week 1): pt to demonstrate gait with LRAD 50' CGA PT Short Term Goal 4 (Week 1): pt to demonstrate stairs one rail CGA  Skilled Therapeutic Interventions/Progress Updates:    Session 1: Pt received seated in w/c in room, agreeable to PT session. Pt reports intermittent pain in lateral aspect of R knee, currently rated 2/10 but intermittently increases. Pt describes pain as "sharp" and appears to be related to muscle spasm in LE. Pt premedicated prior to start of therapy session, declines intervention. Dependent transport via w/c to/from therapy gym for time and energy conservation. Reviewed HEP including supine and seated BLE strengthening therex: bridges, heel slides, SLR, SKFO, hip add squeeze, seated LAQ, seated marches, seated HS curls with orange theraband, and seated ankle DF x 10-15 reps each. Provided handout for HEP. Pt left seated in w/c in room with needs in reach at end of session.  Session 2: Pt received seated in w/c in room, agreeable to PT session. No complaints of pain this PM. Pt's wife [name] present for hands-on therapy session. Pt is at Supervision level for sit to stand to RW throughout session. Ambulation x 200 ft with RW at Supervision level. Pt able to navigate up/down 4 x 6" steps with L handrail laterally at Supervision level. Pt's wife demos good understanding of and ability to assist pt on stairs. Pt is safe to d/c home with wife at this time. Pt left seated in w/c in room with needs in reach, wife present at end of session.  Therapy  Documentation Precautions:  Precautions Precautions: Cervical,Fall Precaution Booklet Issued: Yes (comment) Precaution Comments: no bending, lifting >10lbs, twisting, arching Required Braces or Orthoses: Cervical Brace Cervical Brace: At all times,Hard collar Restrictions Weight Bearing Restrictions: No Other Position/Activity Restrictions: Pt stating today MD said he only needed to wear brace when walking/transfering and could take it off in chair.   Therapy/Group: Individual Therapy   Excell Seltzer, PT, DPT  08/20/2020, 11:45 AM

## 2020-08-20 NOTE — Discharge Summary (Signed)
Physician Discharge Summary  Patient ID: Francisco [name] MRN: [id number] DOB/AGE: [birth year] 74 y.o.  Admit date: 08/12/2020 Discharge date: 08/20/2020  Discharge Diagnoses:  Principal Problem:   Cervical myelopathy ([place]) Active Problems:   Anemia   CKD (chronic kidney disease) stage 3, GFR 30-59 ml/min (HCC)   Essential hypertension   Anemia of chronic disease   Discharged Condition: N/A  Significant Diagnostic Studies: N/A   Labs:  Basic Metabolic Panel: BMP Latest Ref Rng & Units 08/17/2020 08/12/2020 07/30/2020  Glucose 70 - 99 mg/dL 108(H) 109(H) 110(H)  BUN 8 - 23 mg/dL 34(H) 23 43(H)  Creatinine 0.61 - 1.24 mg/dL 1.26(H) 1.19 1.39(H)  Sodium 135 - 145 mmol/L 143 141 139  Potassium 3.5 - 5.1 mmol/L 3.9 4.3 4.8  Chloride 98 - 111 mmol/L 106 105 101  CO2 22 - 32 mmol/L 26 26 28   Calcium 8.9 - 10.3 mg/dL 9.2 9.2 8.6(L)    CBC: CBC Latest Ref Rng & Units 08/17/2020 07/30/2020 03/04/2014  WBC 4.0 - 10.5 K/uL 9.4 10.7(H) -  Hemoglobin 13.0 - 17.0 g/dL 9.8(L) 10.4(L) -  Hematocrit 39.0 - 52.0 % 32.0(L) 32.7(L) 26.5(L)  Platelets 150 - 400 K/uL 397 265 -    CBG: No results for input(s): GLUCAP in the last 168 hours.  Brief HPI:   Francisco [name] is a 74 y.o. male with history of HTN, MG, COPD, CKD three, S OB/OSA--noncompliant with CPAP, CAS with plans for stent in the future, lumbar and cervical stenosis in the past who started having acute on chronic weakness with spasticity and neurogenic bladder on 07/14/2020.  MRI done revealing moderate to severe spinal stenosis C3-C4 with hyperintense signal due to compressive changes.  He was evaluated by Dr. Cari Caraway and admitted to [facility] on 08/07/2020 for C3-C5 decompressive laminectomy with posterior lateral arthrodesis C3-C6.  Postop recommendations are to wear collar when out of bed.  Drain was removed on 11/29 and patient placed on Lovenox for DVT prophylaxis.  Therapy was ongoing and patient was noted to have limitations  due to BUE weakness with impaired standing balance and LLE weakness with unsteady gait.  CIR was recommended due to functional decline.   Hospital Course: Francisco [name] was admitted to rehab 08/12/2020 for inpatient therapies to consist of PT and OT at least three hours five days a week. Past admission physiatrist, therapy team and rehab RN have worked together to provide customized collaborative inpatient rehab.  Subcu Lovenox was used for DVT prophylaxis during his stay.  Iron supplement continues for acute blood loss anemia and he is to follow-up with PCP for repeat CBC for monitoring.  Hemoccult ordered however patient has had issues with constipation and specimen was available during the stay. He has had issues with constipation but refused suppository or enema for intervention prior to discharge.  His blood pressures were monitored on TID basis and has been stable.  Follow-up labs shows CKD to be at baseline.  Myasthenia gravis has been stable on Imuran and Mestinon.    He has had issues with neuropathy with poor results with gabapentin therefore was transitioned over to Lyrica and titrated up to 200 mg twice daily.  Pain is currently controlled with as needed use of oxycodone or hydrocodone.  He prefers to use hydrocodone for pain management and plans on transitioning back to home dose for pain control.  His neck incision is C/D/I with staples in place and patient has appt for follow up with NS tomorrow for staple removal..  Serial CBC showed ABLA and recommend follow up CBC in 1-2 weeks to monitor H/H.  He has been making steady progress with goals for modified independent.  However patient insisted on discharge to home on 12/09 and wife was comfortable her ability to provide care needed at the family education.  He will continue to receive follow-up outpatient PT and OT at [facility] after discharge.  He was discharged home on 12/09.    Rehab course: During patient's stay in rehab team conference was  held to monitor patient's progress, set goals and discuss barriers to discharge. At admission, patient required mod assist with ADL tasks and min assist with mobility.  He has had improvement in activity tolerance, balance, postural control as well as ability to compensate for deficits. He is able to complete ADL tasks with supervision and use of AE. He requires supervision for transfers and ambulating 200' with RW. Family education was completed with wife.     Disposition: Home   Diet: Regular   Special Instructions: 1. No driving or strenuous activity till cleared by MD 2. Repeat BMET in 1-2 weeks to monitor renal status.    Discharge Instructions    Ambulatory referral to Occupational Therapy   Complete by: As directed    Eval and treat   Ambulatory referral to Physical Medicine Rehab   Complete by: As directed    1-2 weeks TC appt   Ambulatory referral to Physical Therapy   Complete by: As directed    Eval and treat     Allergies as of 08/20/2020      Reactions   Azithromycin Other (See Comments)   Avoid due to myasthenia gravis   Codeine Nausea And Vomiting      Medication List    STOP taking these medications   bisacodyl 5 MG EC tablet Commonly known as: DULCOLAX   celecoxib 100 MG capsule Commonly known as: CELEBREX   docusate sodium 100 MG capsule Commonly known as: COLACE   enoxaparin 60 MG/0.6ML injection Commonly known as: LOVENOX   gabapentin 300 MG capsule Commonly known as: NEURONTIN   hydrochlorothiazide 25 MG tablet Commonly known as: HYDRODIURIL   Magnesium 500 MG Tabs   menthol-cetylpyridinium 3 MG lozenge Commonly known as: CEPACOL   ondansetron 4 MG tablet Commonly known as: ZOFRAN   oxyCODONE 5 MG immediate release tablet Commonly known as: Oxy IR/ROXICODONE   Oxycodone HCl 10 MG Tabs   sodium phosphate 7-19 GM/118ML Enem     TAKE these medications   acetaminophen 325 MG tablet Commonly known as: TYLENOL Take 2 tablets (650 mg  total) by mouth every 6 (six) hours as needed for mild pain.   alum & mag hydroxide-simeth 200-200-20 MG/5ML suspension Commonly known as: MAALOX/MYLANTA Take 30 mLs by mouth every 6 (six) hours as needed for indigestion.   azaTHIOprine 50 MG tablet Commonly known as: IMURAN Take 150 mg by mouth daily.   furosemide 20 MG tablet Commonly known as: LASIX Take 20 mg by mouth 2 (two) times daily as needed (swelling).   iron polysaccharides 150 MG capsule Commonly known as: NIFEREX Take 1 capsule (150 mg total) by mouth 2 (two) times daily before lunch and supper.   levothyroxine 88 MCG tablet Commonly known as: SYNTHROID Take 88 mcg by mouth daily.   losartan 50 MG tablet Commonly known as: COZAAR Take 50 mg by mouth 2 (two) times daily.   magnesium oxide 400 (241.3 Mg) MG tablet Commonly known as: MAG-OX Take 1 tablet (400  mg total) by mouth daily.   oxybutynin 5 MG tablet Commonly known as: DITROPAN Take 5 mg by mouth 3 (three) times daily.   pantoprazole 40 MG tablet Commonly known as: PROTONIX Take 40 mg by mouth 2 (two) times daily before a meal.   polyethylene glycol 17 g packet Commonly known as: MIRALAX / GLYCOLAX Take 17 g by mouth daily. May increase to twice a day if no BM in 24 hours What changed:   when to take this  reasons to take this  additional instructions   predniSONE 20 MG tablet Commonly known as: DELTASONE Take 20 mg by mouth every other day.   pregabalin 100 MG capsule Commonly known as: LYRICA Take 1 capsule (100 mg total) by mouth 2 (two) times daily.   pyridostigmine 60 MG tablet Commonly known as: MESTINON Take 60 mg by mouth in the morning and at bedtime.   rOPINIRole 4 MG tablet Commonly known as: REQUIP Take 4 mg by mouth at bedtime.   senna 8.6 MG Tabs tablet Commonly known as: SENOKOT Take 2 tablets (17.2 mg total) by mouth daily. What changed: when to take this   traZODone 50 MG tablet Commonly known as:  DESYREL Take 0.5 tablets (25 mg total) by mouth at bedtime as needed for sleep.   vitamin B-12 1000 MCG tablet Commonly known as: CYANOCOBALAMIN Take 1,000 mcg by mouth daily.       Follow-up Information    Lovorn, Jinny Blossom, MD Follow up.   Specialty: Physical Medicine and Rehabilitation Why: Office will call you with follow up appointment Contact information: [address] [place] [address] [phone]        Adin Hector, MD. Call.   Specialty: Internal Medicine Why: call for post hospital follow up Contact information: [place] [address] [phone]        Meade Maw, MD Follow up.   Specialty: Neurosurgery Why: Keep 12/10 appointment for staple removal Contact information: [place] [address] [phone]               Signed: Bary Leriche 08/20/2020, 7:09 PM

## 2020-08-24 ENCOUNTER — Telehealth: Payer: Self-pay | Admitting: *Deleted

## 2020-08-24 NOTE — Telephone Encounter (Signed)
Transitional Care call--I spoke with Francisco [name]    1. Are you/is patient experiencing any problems since coming home? Are there any questions regarding any aspect of care? NO 2. Are there any questions regarding medications administration/dosing? Are meds being taken as prescribed? Patient should review meds with caller to confirm He reports all medications are obtained. 3. Have there been any falls? NO 4. Has Home Health been to the house and/or have they contacted you? If not, have you tried to contact them? Can we help you contact them? NO HH ORDERED. He will be going to [place] for therapies. 5. Are bowels and bladder emptying properly? Are there any unexpected incontinence issues? If applicable, is patient following bowel/bladder programs?NO PROBLEM. Bowels a little sluggish but will get Colace. 6. Any fevers, problems with breathing, unexpected pain? NO 7. Are there any skin problems or new areas of breakdown? NO 8. Has the patient/family member arranged specialty MD follow up (ie cardiology/neurology/renal/surgical/etc)?  Can we help arrange? YES APPTS MADE. HE HAS HAD HIS SUTURES REMOVED. 9. Does the patient need any other services or support that we can help arrange? NO 10. Are caregivers following through as expected in assisting the patient? YES 11. Has the patient quit smoking, drinking alcohol, or using drugs as recommended? NO DRIVING UNTIL RELEASED BY MD  Appointment Friday 08/28/20 Arrive 10:30 for 11:00 with Dr Ranell Patrick. Address reviewed. [place]

## 2020-08-25 ENCOUNTER — Ambulatory Visit: Payer: Medicare Other

## 2020-08-25 ENCOUNTER — Ambulatory Visit: Payer: Medicare Other | Attending: Internal Medicine | Admitting: Occupational Therapy

## 2020-08-25 ENCOUNTER — Other Ambulatory Visit: Payer: Self-pay

## 2020-08-25 ENCOUNTER — Encounter: Payer: Self-pay | Admitting: Occupational Therapy

## 2020-08-25 DIAGNOSIS — R278 Other lack of coordination: Secondary | ICD-10-CM

## 2020-08-25 DIAGNOSIS — G8929 Other chronic pain: Secondary | ICD-10-CM | POA: Diagnosis present

## 2020-08-25 DIAGNOSIS — G959 Disease of spinal cord, unspecified: Secondary | ICD-10-CM | POA: Diagnosis present

## 2020-08-25 DIAGNOSIS — R2681 Unsteadiness on feet: Secondary | ICD-10-CM | POA: Diagnosis present

## 2020-08-25 DIAGNOSIS — M5442 Lumbago with sciatica, left side: Secondary | ICD-10-CM

## 2020-08-25 DIAGNOSIS — M6281 Muscle weakness (generalized): Secondary | ICD-10-CM

## 2020-08-25 NOTE — Therapy (Addendum)
[place] MA[facility] SERVICES [address] [place], [place], [address] Phone: [phone]   Fax:  [phone]  Physical Therapy Evaluation  Patient Details  Name: Francisco [name] MRN: [id number] Date of Birth: 22-Nov-1945 Referring Provider (PT): Lauraine Rinne, PA-C   Encounter Date: 08/25/2020   PT End of Session - 08/25/20 1448    Visit Number 1    Number of Visits 25    Date for PT Re-Evaluation 11/17/20    Authorization Type UHC Medicare    Authorization Time Period 08/25/20-11/17/20    PT Start Time 1335    PT Stop Time 1431    PT Time Calculation (min) 56 min    Activity Tolerance Patient tolerated treatment well;No increased pain;Patient limited by fatigue    Behavior During Therapy [facility] for tasks assessed/performed           Past Medical History:  Diagnosis Date  . Arthritis    lower left hip  . Atypical angina ([place])   . Bilateral hand numbness    from back surgery  . Bronchitis, chronic ([place])   . Cancer [facility])    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; Merkel cell cancer, and Basal cell cancer (twice; 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  . Carotid stenosis   . CKD (chronic kidney disease) stage 3, GFR 30-59 ml/min (HCC)   . 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 DDD (degenerative disc disease), cervical   . Hypercholesterolemia   . Hypertension   . Hypothyroidism    pt takes Levothyroxine daily  . Lumbosacral spinal stenosis   . Myasthenia gravis, adult form ([place])   . PAD (peripheral artery disease) ([place])   . Shortness of breath    Lung MD- Dr Darlin Coco  . Sleep apnea    do not use CPAP every night    Past Surgical History:  Procedure Laterality Date  . ANTERIOR CERVICAL DECOMP/DISCECTOMY FUSION  07/18/2011   Procedure: ANTERIOR CERVICAL DECOMPRESSION/DISCECTOMY FUSION 2 LEVELS;  Surgeon: Cooper Render Pool;  Location: [facility];  Service: Neurosurgery;  Laterality: N/A;  cervical five-six, cervical six-seven  anterior cervical discectomy and fusion  . BACK SURGERY     in [place]     01/2020 Right, 04/2020 Left  . CARDIAC CATHETERIZATION     2005 at [facility], no stents  . CATARACT EXTRACTION W/PHACO Left 01/06/2020   Procedure: CATARACT EXTRACTION PHACO AND INTRAOCULAR LENS PLACEMENT (IOC) ISTENT INJ LEFT 3.81  00:33.3;  Surgeon: Eulogio Bear, MD;  Location: [facility];  Service: Ophthalmology;  Laterality: Left;  . CATARACT EXTRACTION W/PHACO Right 02/03/2020   Procedure: CATARACT EXTRACTION PHACO AND INTRAOCULAR LENS PLACEMENT ([place]) RIGHT ISTENT INJ;  Surgeon: Eulogio Bear, MD;  Location: [facility];  Service: Ophthalmology;  Laterality: Right;  4.29 0:35.6  . COLONOSCOPY    . HERNIA REPAIR Left    inguinal hernia repair in 1985  . LUMBAR LAMINECTOMY/DECOMPRESSION MICRODISCECTOMY Left 02/24/2014   Procedure: LUMBAR LAMINECTOMY/DECOMPRESSION MICRODISCECTOMY LUMBAR THREE-FOUR, FOUR-FIVE, LEFT FIVE-SACRAL ONE ;  Surgeon: Charlie Pitter, MD;  Location: [facility];  Service: Neurosurgery;  Laterality: Left;  LUMBAR LAMINECTOMY/DECOMPRESSION MICRODISCECTOMY LUMBAR THREE-FOUR, FOUR-FIVE, LEFT FIVE-SACRAL ONE   . POSTERIOR CERVICAL FUSION/FORAMINOTOMY N/A 08/07/2020   Procedure: C3-6 POSTERIOR FUSION WITH DECOMPRESSION;  Surgeon: Meade Maw, MD;  Location: [facility] ORS;  Service: Neurosurgery;  Laterality: N/A;  . PROSTATECTOMY  8/14   [facility] Dr Mare Ferrari  There were no vitals filed for this visit.    Subjective Assessment - 08/25/20 1346    Subjective Francisco [name] is a 45yoM referred to OPPT neuro s/p myelopathy s/p cervical decompression and fusion. Pt sustained several months of general decline in mobility prior to surgical decompression. Pt went to inpatient rehab after hospitalization. [facility].  Pt underwent C3-6 decompression, fusion on 11/26. PMH: myasthenia gravis, 2012 ACDF 5/6, 6/7; OSA,  hypothyroidism, HTN, COPD, multiple back surgeries, 2021 carpal tunnel release, referred to OPOT, but only seen for evaluation. 8/10 OPPT evaluation for LSS c radiating pain into the left hip, only completed evaluation session. September - November progressive decline in leg strength and tolerance to upright activity. Also noted a decline in tool manipulation for ADL (pen and meal utensil). Seen by OT and PT as an outpatient for other potential etiology (Carpal tunnel syndrome, and lumbar spinal stenosis), but eventually identified to be related to cervical myelopathy. Several months prior has no limitations in mobility or function. Pt scheduled for Jan 6th: Carotid endarterectomy c Dr. Leotis Pain. Pt has a long history of lumbar DJD, s/p L5/S1 decompression discectomy x2, L5 nerve root injections. Most recent Lumbar MRI (11/10/18) revealin gof advanced formaminal stenosis of L3/4, L4/5, L5/S1. Pt is has had LLE neurological weakness and pain since 2015 and has been followed by neurosurgery.    Pertinent History Francisco [name] is a 17yoM referred to OPPT neuro s/p myelopathy s/p cervical decompression and fusion. Pt sustained several months of general decline in mobility prior to surgical decompression. Pt went to inpatient rehab after hospitalization. [facility].  Pt underwent C3-6 decompression, fusion on 11/26. PMH: myasthenia gravis, 2012 ACDF 5/6, 6/7; OSA, hypothyroidism, HTN, COPD, multiple back surgeries, 2021 carpal tunnel release, referred to OPOT, but only seen for evaluation. 8/10 OPPT evaluation for LSS c radiating pain into the left hip, only completed evaluation session. September - November progressive decline in leg strength and tolerance to upright activity. Also noted a decline in tool manipulation for ADL (pen and meal utensil). Seen by OT and PT as an outpatient for other potential etiology (Carpal tunnel syndrome, and lumbar spinal stenosis), but eventually identified to  be related to cervical myelopathy. Several months prior has no limitations in mobility or function. Pt scheduled for Jan 6th: Carotid endarterectomy c Dr. Leotis Pain. Pt has a long history of lumbar DJD, s/p L5/S1 decompression discectomy x2, L5 nerve root injections. Most recent Lumbar MRI (11/10/18) revealin gof advanced formaminal stenosis of L3/4, L4/5, L5/S1. Pt is has had LLE neurological weakness and pain since 2015 and has been followed by neurosurgery.    How long can you sit comfortably? Not limited    How long can you stand comfortably? 3-5 minutes    How long can you walk comfortably? 10 minutes    Patient Stated Goals return to functional baseline status of Francisco [name] 2021.    Currently in Pain? No/denies   No current pain issues; 3-4x/week has pain in Left hip, thigh, leg             [facility] PT Assessment - 08/25/20 0001      Assessment   Medical Diagnosis Cervical Myelopathy s/p fusion    Referring Provider (PT) Lauraine Rinne, PA-C    Onset Date/Surgical Date 08/07/20    Hand Dominance Right    Next MD Visit --   Mid January   Prior Therapy Inpatient rehab      Precautions  Precautions Cervical   Collar in place; 5lb lifting restrict   Required Braces or Orthoses Cervical Brace      Restrictions   Weight Bearing Restrictions No      Balance Screen   Has the patient fallen in the past 6 months Yes    How many times? 4    Has the patient had a decrease in activity level because of a fear of falling?  Yes    Is the patient reluctant to leave their home because of a fear of falling?  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   Leisure traveling to the [employer]   Overall Cognitive Status Within Functional Limits for tasks assessed      Observation/Other Assessments   Focus on Therapeutic Outcomes (FOTO)  51      Sensation   Light Touch Impaired Detail   bimanual paresthesias CTS (40%); Left sciatic stabbing pain (weeks) calf/foot paresthesia     ROM / Strength   AROM / PROM / Strength Strength      Strength   Strength Assessment Site Ankle    Right Hip Flexion 5/5    Right Hip External Rotation  4+/5    Right Hip Internal Rotation 5/5    Right Hip ABduction --   horizontal ABDCT 5/5   Left Hip Flexion 4/5    Left Hip External Rotation 4+/5    Left Hip Internal Rotation 4/5    Left Hip ABduction --   horizontal ABDCT 4+/5   Right Knee Flexion 4+/5    Right Knee Extension 5/5    Left Knee Flexion 4-/5    Left Knee Extension 4+/5    Right/Left Ankle Right;Left    Right Ankle Dorsiflexion 5/5    Left Ankle Dorsiflexion 3-/5   unable to DF beyond neutral; cannot withstand resistance     Transfers   Five time sit to stand comments  27.12secH   13.5 04/21/20     Ambulation/Gait   Ambulation/Gait Yes    Ambulation/Gait Assistance 5: Supervision    Ambulation Distance (Feet) 200 Feet    Assistive device 4-wheeled walker    Gait Pattern --   flexed posture, bent knees, flat foot strike bilat, excessive trunk lean laterally bilat   Gait velocity 29.76sec (0.62m/s)   10MWT   Gait Comments Fatigue driven by LLE fatigue              Objective measurements completed on examination: See above findings.       PT Education - 08/25/20 1445    Education provided Yes    Education Details author suspicious of new LLE sciatica with L5/S1 weakness of leg being  unrelated to cervical myopathy. Would like to monitor.    Person(s) Educated Patient  Methods Explanation;Demonstration    Comprehension Verbalized understanding;Returned demonstration            PT Short Term Goals - 08/25/20 1500      PT SHORT TERM GOAL #1   Title After 4 weeks patient will be independent in HEP to progress strength, AMB, and balance.    Time 4    Period Weeks    Status New    Target Date 09/22/20      PT SHORT TERM GOAL #2   Title After 4 weeks pt will demonstrate improved 5xSTS hands free chair + airex foam in <20sec    Time 4    Period Weeks    Status New    Target Date 09/22/20             PT Long Term Goals - 08/25/20 1501      PT LONG TERM GOAL #1   Title After 8 weeks pt to demonstrate FOTO score >65 to demonstrate improved capacity to participate in ADL/IADL actiivtry.    Baseline 51 at evaluation.    Time 8    Period Weeks    Status New    Target Date 10/20/20      PT LONG TERM GOAL #2   Title After 8 weeks pt will demonstrate 6MWT (LRAD) >762ft to demonstrate improved capacity to access the community.    Baseline Tolerating up to 258ft AMB at DC from CIR 08/20/20: (753ft on 04/21/20)    Time 8    Period Weeks    Status New    Target Date 10/20/20      PT LONG TERM GOAL #3   Title After 8 weeks pt to demonstrate improved 5xSTS from chair + airex hands free <15 seconds.    Time 8    Period Weeks    Status New    Target Date 10/20/20      PT LONG TERM GOAL #4   Title After 12 weeks pt to demonstrate improved FOTO score >70, BBT>46/56    Time 12    Period Weeks    Status New    Target Date 11/17/20      PT LONG TERM GOAL #5   Title After 12 weeks pt will demonstrate improved 6MWT>1257ft c LRAD to improve safety in community AMB.    Time 12    Period Weeks    Status New    Target Date 11/17/20                  Plan - 08/25/20 1450    Clinical Impression Statement Examination revealing of limitations in transfers  and ambulation independence and safety, as well as decreased tolerance to general physical activity, decreased balance. Pt had presumably sustained a substantial and prolonged loss of function prior to  cervical decompression, hence it has been 4-5 months since patient has been at his true baseline, however immediately s/p cervical decompression pt reports improved use of BUE in ADL performance. Pt has made significant progress since then at inpatient rehab. Examination also points to neurological deficits of the LLE in the L5-S1 distribution which are consistent of prior disease and apparent advancement given motor weakness of ankle not previously seen in documentation. Current BLE strength deficits are difficult to correlate with cervical myelopathy v progression of chronic lumbar spine lateral stenosis given tangential presentation and progression timelines. Pt will benefitr from skilled PT intervention too address deficits in strength, balance, motor control, in order to improved tolernace, indepdence, and  safety in performance of ADL, IADL, and leisure actiivty.    Personal Factors and Comorbidities Age;Comorbidity 2;Past/Current Experience;Time since onset of injury/illness/exacerbation    Comorbidities COPD, History of Cancer, myasthenia gravis, chronic lumbar surgical history.    Examination-Activity Limitations Lift;Squat;Bend;Stairs;Stand;Transfers;Insurance claims handler;Bathing;Hygiene/Grooming;Dressing;Toileting    Examination-Participation Restrictions Yard Work;Church;Cleaning;Driving    Stability/Clinical Decision Making Evolving/Moderate complexity    Clinical Decision Making High    Rehab Potential Good    Clinical Impairments Affecting Rehab Potential PMH    PT Frequency 2x / week    PT Duration 12 weeks    PT Treatment/Interventions ADLs/Self Care Home Management;Electrical Stimulation;Moist Heat;Traction;Ultrasound;Gait training;Stair training;Functional mobility  training;Therapeutic activities;Therapeutic exercise;Balance training;Neuromuscular re-education;Patient/family education;Manual techniques;Passive range of motion;Dry needling;Joint Manipulations;Cryotherapy    PT Next Visit Plan 6MWT; dealer's choice of balance assessment, establish HEP    PT Home Exercise Plan none at eval    Consulted and Agree with Plan of Care Patient           Patient will benefit from skilled therapeutic intervention in order to improve the following deficits and impairments:  Abnormal gait,Decreased balance,Decreased mobility,Decreased endurance,Difficulty walking,Hypomobility,Impaired sensation,Decreased range of motion,Impaired perceived functional ability,Decreased activity tolerance,Decreased coordination,Decreased strength,Impaired flexibility,Postural dysfunction,Pain  Visit Diagnosis: Cervical myelopathy (HCC)  Chronic left-sided low back pain with left-sided sciatica  Muscle weakness (generalized)  Unsteadiness on feet     Problem List Patient Active Problem List   Diagnosis Date Noted  . S/P cervical spinal fusion   . Leukocytosis   . Essential hypertension   . Anemia of chronic disease   . Postoperative pain   . Neuropathic pain   . Cervical myelopathy ([place]) 08/07/2020  . Leg weakness, bilateral 07/13/2020  . Myalgia 10/30/2019  . PAD (peripheral artery disease) ([place]) 06/06/2019  . CKD (chronic kidney disease) stage 3, GFR 30-59 ml/min (HCC) 04/29/2019  . B12 deficiency 01/23/2019  . Left arm numbness 02/28/2018  . Neck pain 02/28/2018  . Anemia 02/02/2017  . DDD (degenerative disc disease), cervical 02/02/2017  . Hypothyroid 02/02/2017  . MRSA (methicillin resistant staph aureus) culture positive 02/02/2017  . Nocturnal hypoxia 02/02/2017  . Senile purpura ([place]) 10/03/2016  . Essential hypertension, benign 09/16/2016  . Bilateral carotid artery disease ([place]) 09/16/2016  . 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/18/2016  . Merkel cell  carcinoma ([place]) 03/02/2016  . History of prostate cancer 12/21/2015  . Left carpal tunnel syndrome 11/11/2015  . Kidney stone on left side 07/05/2015  . Myasthenia gravis ([place]) 05/26/2015  . Long-term use of high-risk medication 10/15/2014  . Persistent cough 09/10/2014  . Pure hypercholesterolemia 07/25/2014  . Spinal stenosis, lumbar region, with neurogenic claudication 02/24/2014  . Lumbosacral stenosis with neurogenic claudication ([place]) 02/24/2014  . COPD (chronic obstructive pulmonary disease) ([place]) 01/22/2014   3:14 PM, 08/25/20 Etta Grandchild, PT, DPT Physical Therapist - [place] [phone]     Etta Grandchild 08/25/2020, 3:14 PM  [place] [facility] SERVICES [address] [place], [place], [address] Phone: [phone]   Fax:  [phone]  Name: CADON RACZKA MRN: [id number] Date of Birth: December 18, 1945

## 2020-08-25 NOTE — Therapy (Signed)
[place] MAIN [facility] SERVICES [address] [place], [place], [address] Phone: [phone]   Fax:  [phone]  Occupational Therapy Evaluation  Patient Details  Name: Francisco [name] MRN: [id number] Date of Birth: [birth year] Referring Provider (OT): Dr Annette Stable   Encounter Date: 08/25/2020   OT End of Session - 08/25/20 1548    Visit Number 1    Number of Visits 12    Date for OT Re-Evaluation 10/09/20    OT Start Time 1430    OT Stop Time 1524    OT Time Calculation (min) 54 min    Activity Tolerance Patient tolerated treatment well    Behavior During Therapy [facility] for tasks assessed/performed           Past Medical History:  Diagnosis Date  . Arthritis    lower left hip  . Atypical angina ([place])   . Bilateral hand numbness    from back surgery  . Bronchitis, chronic ([place])   . Cancer [facility])    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; Merkel cell cancer, and Basal cell cancer (twice; 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  . Carotid stenosis   . CKD (chronic kidney disease) stage 3, GFR 30-59 ml/min (HCC)   . 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 DDD (degenerative disc disease), cervical   . Hypercholesterolemia   . Hypertension   . Hypothyroidism    pt takes Levothyroxine daily  . Lumbosacral spinal stenosis   . Myasthenia gravis, adult form ([place])   . PAD (peripheral artery disease) ([place])   . Shortness of breath    Lung MD- Dr Darlin Coco  . Sleep apnea    do not use CPAP every night    Past Surgical History:  Procedure Laterality Date  . ANTERIOR CERVICAL DECOMP/DISCECTOMY FUSION  07/18/2011   Procedure: ANTERIOR CERVICAL DECOMPRESSION/DISCECTOMY FUSION 2 LEVELS;  Surgeon: Cooper Render Pool;  Location: [place] NEURO ORS;  Service: Neurosurgery;  Laterality: N/A;  cervical five-six, cervical six-seven anterior cervical discectomy and fusion  . BACK SURGERY     in [place]     01/2020 Right,  04/2020 Left  . CARDIAC CATHETERIZATION     2005 at [facility], no stents  . CATARACT EXTRACTION W/PHACO Left 01/06/2020   Procedure: CATARACT EXTRACTION PHACO AND INTRAOCULAR LENS PLACEMENT (IOC) ISTENT INJ LEFT 3.81  00:33.3;  Surgeon: Eulogio Bear, MD;  Location: [place];  Service: Ophthalmology;  Laterality: Left;  . CATARACT EXTRACTION W/PHACO Right 02/03/2020   Procedure: CATARACT EXTRACTION PHACO AND INTRAOCULAR LENS PLACEMENT ([place]) RIGHT ISTENT INJ;  Surgeon: Eulogio Bear, MD;  Location: [place];  Service: Ophthalmology;  Laterality: Right;  4.29 0:35.6  . COLONOSCOPY    . HERNIA REPAIR Left    inguinal hernia repair in 1985  . LUMBAR LAMINECTOMY/DECOMPRESSION MICRODISCECTOMY Left 02/24/2014   Procedure: LUMBAR LAMINECTOMY/DECOMPRESSION MICRODISCECTOMY LUMBAR THREE-FOUR, FOUR-FIVE, LEFT FIVE-SACRAL ONE ;  Surgeon: Charlie Pitter, MD;  Location: [place] NEURO ORS;  Service: Neurosurgery;  Laterality: Left;  LUMBAR LAMINECTOMY/DECOMPRESSION MICRODISCECTOMY LUMBAR THREE-FOUR, FOUR-FIVE, LEFT FIVE-SACRAL ONE   . POSTERIOR CERVICAL FUSION/FORAMINOTOMY N/A 08/07/2020   Procedure: C3-6 POSTERIOR FUSION WITH DECOMPRESSION;  Surgeon: Meade Maw, MD;  Location: [facility] ORS;  Service: Neurosurgery;  Laterality: N/A;  . PROSTATECTOMY  8/14   [facility] Dr Mare Ferrari     There were no vitals filed for this visit.   Subjective Assessment - 08/25/20 1437  Subjective  Pt reports he was recently hospitalized for a spine surgery.  He reports he lost mobility in hands, legs, and feet.    Pertinent History Pt is a 74 y/o M with hx of cervical myelopathy & admitted on 08/07/20 for scheduled C3-6 Posterior fusion with decompression by Dr. Izora Ribas. PMH: atypical angina, PAD, carotid stenosis, HTN, HLD, COPD, Myasthenia gravis, hypothyroidism, CKD-III, OSA requiring nocturnal PAP therapy, OA, cervical DDD, lumbosacral spinal stenosis, chronic DOE 2/2 COPD.  Pt went to IP rehab and was  discharged on 08/20/2020 with orders for OP OT and PT.  History of carpal tunnel bilaterally.    Patient Stated Goals Pt would like to be able to walk again, and be independent as he was before.    Currently in Pain? No/denies    Pain Score 0-No pain             [facility] OT Assessment - 08/25/20 1441      Assessment   Medical Diagnosis Cervical Myelopathy s/p fusion    Referring Provider (OT) Dr Annette Stable    Onset Date/Surgical Date 08/07/20    Hand Dominance Right    Prior Therapy Inpatient rehab      Precautions   Precautions Cervical;Fall    Precaution Comments No lifting over 20 lbs    Required Braces or Orthoses Cervical Brace      Restrictions   Weight Bearing Restrictions No    Other Position/Activity Restrictions no known restrictions      Balance Screen   Has the patient fallen in the past 6 months Yes    How many times? 4    Has the patient had a decrease in activity level because of a fear of falling?  Yes    Is the patient reluctant to leave their home because of a fear of falling?  No      Home  Environment   Family/patient expects to be discharged to: Private residence    Living Arrangements Spouse/significant other    Available Help at Discharge Family    Type of [place] One level    Alternate Level Stairs - Number of Steps 3 steps to enter    [employer];Door    [place] height    Home Equipment Grab bars - tub/shower;Grab bars - toilet;Toilet riser;Walker - 2 wheels;Walker - 4 wheels;Cane - single point;Hand held shower head;Shower seat    Lives With Spouse      Prior Function   Level of Independence Independent    Vocation Full time employment    Surveyor, quantity, 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, clinical (histocompatibility and immunogenetics)    Leisure decorate yard for holidays, on computer      ADL   Eating/Feeding Modified independent    Grooming Modified independent    Upper  Body Bathing Modified independent    Lower Body Bathing Increased time    Upper Body Dressing Increased time    Lower Body Dressing Moderate assistance    Toilet Transfer Supervision/safety    Toileting - Clothing Manipulation Increased time    Toileting -  Hygiene Increase time;Supervision/safety    Tub/Shower Transfer Supervision/safety    ADL comments Pt reports he normally helps out around the house as much as possible with light housekeeping, grilling and yardwork.  He does have large decoration displays throughtout the year for the holidays and would like to be able  to return to putting up large blow ups and decor in the yard. 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  In the past his job required lots of driving but he is not able to drive currently.      IADL   Prior Level of Function Shopping independent    Shopping Needs to be accompanied on any shopping trip    Prior Level of Function Light Housekeeping independent    Light Housekeeping Needs help with all home maintenance tasks    Prior Level of Function Meal Prep independent    Meal Prep Needs to have meals prepared and served    Prior Level of Function Scientist, research (physical sciences) Relies on family or friends for transportation    Prior Level of Function Medication Managment independent    Medication Management Takes responsibility if medication is prepared in advance in seperate dosage    Prior Level of Function Financial Management independent    Physiological scientist financial matters independently (budgets, writes checks, pays rent, bills goes to bank), collects and keeps track of income      Mobility   Mobility Status History of falls;Needs assist    Mobility Status Comments was independent with ambulation 6-7 weeks before surgery, then used a walker. Has done some walking in rehab and at home with walker.      Written Expression   Dominant Hand Right       Cognition   Overall Cognitive Status Within Functional Limits for tasks assessed      Observation/Other Assessments   Focus on Therapeutic Outcomes (FOTO)  51      Sensation   Light Touch Appears Intact    Hot/Cold Appears Intact    Additional Comments both hands with numbness and tingling, history of carpal tunnel      Coordination   Gross Motor Movements are Fluid and Coordinated No    Fine Motor Movements are Fluid and Coordinated No    9 Hole Peg Test Right;Left    Right 9 Hole Peg Test 38    Left 9 Hole Peg Test 55    Coordination impaired bilaterally, worse on left than right      ROM / Strength   AROM / PROM / Strength AROM;Strength      AROM   Overall AROM Comments Bilateral UE ROM WFLs, full fisting bilaterally      Strength   Overall Strength Comments BUE strength 3/5 overall    Right Hand Grip (lbs) 45    Right Hand Lateral Pinch 15 lbs    Right Hand 3 Point Pinch 14 lbs    Left Hand Grip (lbs) 42    Left Hand Lateral Pinch 15 lbs    Left Hand 3 Point Pinch 13 lbs                           OT Education - 08/25/20 1441    Education Details role of OT, goals, POC    Person(s) Educated Patient    Methods Explanation;Demonstration;Handout;Verbal cues;Tactile cues    Comprehension Verbalized understanding;Returned demonstration;Verbal cues required               OT Long Term Goals - 08/25/20 1553      OT LONG TERM GOAL #1   Title Patient to be independent with home exercise program for strengthening and coordination skills.  Baseline decreased strength and coordination bilaterally after cervical surgery    Time 6    Period Weeks    Status New    Target Date 10/09/20      OT LONG TERM GOAL #2   Title Patient will improve grip strength bilaterally by 8# to hold objects securely without dropping.    Baseline see flow sheet for details.    Time 6    Period Weeks    Status New    Target Date 10/10/20      OT LONG TERM GOAL  #3   Title Patient will perform self care tasks with modified independence.    Baseline supervision at time of evaluation.    Time 3    Period Weeks    Status New    Target Date 09/18/20      OT LONG TERM GOAL #4   Title Pt will complete light homemaking tasks with modified independence.    Baseline wife assisting at eval    Time 6    Period Weeks    Status New    Target Date 10/09/20      OT LONG TERM GOAL #5   Title Patient will improve bilateral shoulder ROM and strength to place items on shelves at shoulder height with modified independence.    Baseline unable at eval    Time 6    Period Weeks    Status New    Target Date 10/09/20      Long Term Additional Goals   Additional Long Term Goals Yes      OT LONG TERM GOAL #6   Title Pt will improve left UE coordination by decreasing time on 9 hole peg test by 10 secs to be able to manipulate and complete buttons with modified independence.    Baseline difficulty at eval, see flow sheet for measurements    Time 6    Period Weeks    Status New    Target Date 10/09/20                 Plan - 08/25/20 1612    Clinical Impression Statement Pt is a 74 yo male referred for OT evaluation with cervical myelopathy and s/p cervical decompression and fusion surgery.  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  Patient presents with muscle weakness, decreased coordination skills, decreased transfers, functional mobility and decreased ability to perform ADLs and IADL tasks.  He would benefit from skilled OT services to maximize his safety and independence in necessary daily tasks.    OT Occupational Profile and History Detailed Assessment- Review of Records and additional review of physical, cognitive, psychosocial history related to current functional performance    Occupational performance deficits (Please refer to evaluation for details):  ADL's;IADL's;Leisure;Work    Marketing executive / Function / Physical Skills ADL;Flexibility;ROM;UE functional use;FMC;Dexterity;Sensation;Strength;IADL;Coordination;Mobility;Decreased knowledge of use of DME;Balance;Endurance    Psychosocial Skills Environmental  Adaptations;Habits;Routines and Behaviors    Rehab Potential Good    Clinical Decision Making Several treatment options, min-mod task modification necessary    Comorbidities Affecting Occupational Performance: Presence of comorbidities impacting occupational performance    Comorbidities impacting occupational performance description: age, carpal tunnel history, COPD, chronic lumbar surgical history, history of myasthenia gravis, has a full time job and working currently, unable to drive    Modification or Assistance to Complete Evaluation  No modification of tasks or assist  necessary to complete eval    OT Frequency 2x / week    OT Duration 6 weeks    OT Treatment/Interventions Self-care/ADL training;Therapeutic exercise;Neuromuscular education;Patient/family education;Therapeutic activities;Cryotherapy;Functional Mobility Training;DME and/or AE instruction;Balance training;Moist Heat    Consulted and Agree with Plan of Care Patient           Patient will benefit from skilled therapeutic intervention in order to improve the following deficits and impairments:   Body Structure / Function / Physical Skills: ADL,Flexibility,ROM,UE functional use,FMC,Dexterity,Sensation,Strength,IADL,Coordination,Mobility,Decreased knowledge of use of DME,Balance,Endurance   Psychosocial Skills: Environmental  Adaptations,Habits,Routines and Behaviors   Visit Diagnosis: Cervical myelopathy ([place]) - Plan: Ot plan of care cert/re-cert  Muscle weakness (generalized) - Plan: Ot plan of care cert/re-cert  Other lack of coordination - Plan: Ot plan of care cert/re-cert    Problem List Patient Active Problem List   Diagnosis Date Noted  . S/P cervical  spinal fusion   . Leukocytosis   . Essential hypertension   . Anemia of chronic disease   . Postoperative pain   . Neuropathic pain   . Cervical myelopathy ([place]) 08/07/2020  . Leg weakness, bilateral 07/13/2020  . Myalgia 10/30/2019  . PAD (peripheral artery disease) ([place]) 06/06/2019  . CKD (chronic kidney disease) stage 3, GFR 30-59 ml/min (HCC) 04/29/2019  . B12 deficiency 01/23/2019  . Left arm numbness 02/28/2018  . Neck pain 02/28/2018  . Anemia 02/02/2017  . DDD (degenerative disc disease), cervical 02/02/2017  . Hypothyroid 02/02/2017  . MRSA (methicillin resistant staph aureus) culture positive 02/02/2017  . Nocturnal hypoxia 02/02/2017  . Senile purpura ([place]) 10/03/2016  . Essential hypertension, benign 09/16/2016  . Bilateral carotid artery disease ([place]) 09/16/2016  . 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/18/2016  . 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 History of prostate cancer 12/21/2015  . Left carpal tunnel syndrome 11/11/2015  . Kidney stone on left side 07/05/2015  . Myasthenia gravis ([place]) 05/26/2015  . Long-term use of high-risk medication 10/15/2014  . Persistent cough 09/10/2014  . Pure hypercholesterolemia 07/25/2014  . Spinal stenosis, lumbar region, with neurogenic claudication 02/24/2014  . Lumbosacral stenosis with neurogenic claudication ([place]) 02/24/2014  . COPD (chronic obstructive pulmonary disease) ([place]) 01/22/2014   Francisco [name], OTR/L, CLT  Cristina Mattern 08/25/2020, 4:16 PM  [place] MAIN [facility] SERVICES [address] [place], [place], [address] Phone: [phone]   Fax:  [phone]  Name: Francisco [name] MRN: [id number] Date of Birth: 1946/06/07

## 2020-08-26 ENCOUNTER — Other Ambulatory Visit: Payer: Self-pay

## 2020-08-26 ENCOUNTER — Other Ambulatory Visit
Admission: RE | Admit: 2020-08-26 | Discharge: 2020-08-26 | Disposition: A | Discharge status: Home / Self Care | Payer: Medicare Other | Source: Ambulatory Visit | Attending: Neurosurgery | Admitting: Neurosurgery

## 2020-08-26 DIAGNOSIS — Z01812 Encounter for preprocedural laboratory examination: Secondary | ICD-10-CM | POA: Diagnosis present

## 2020-08-26 DIAGNOSIS — Z20822 Contact with and (suspected) exposure to covid-19: Secondary | ICD-10-CM | POA: Insufficient documentation

## 2020-08-26 LAB — SARS CORONAVIRUS 2 (TAT 6-24 HRS): SARS Coronavirus 2: NEGATIVE

## 2020-08-27 ENCOUNTER — Ambulatory Visit: Payer: Medicare Other

## 2020-08-27 ENCOUNTER — Encounter: Payer: Medicare Other | Admitting: Occupational Therapy

## 2020-08-27 ENCOUNTER — Other Ambulatory Visit: Payer: Self-pay

## 2020-08-27 DIAGNOSIS — M5442 Lumbago with sciatica, left side: Secondary | ICD-10-CM

## 2020-08-27 DIAGNOSIS — R278 Other lack of coordination: Secondary | ICD-10-CM

## 2020-08-27 DIAGNOSIS — M6281 Muscle weakness (generalized): Secondary | ICD-10-CM

## 2020-08-27 DIAGNOSIS — G959 Disease of spinal cord, unspecified: Secondary | ICD-10-CM

## 2020-08-27 DIAGNOSIS — G8929 Other chronic pain: Secondary | ICD-10-CM

## 2020-08-27 DIAGNOSIS — R2681 Unsteadiness on feet: Secondary | ICD-10-CM

## 2020-08-27 NOTE — Therapy (Signed)
[place] MAIN [facility] SERVICES [address] [place], [place], [address] Phone: [phone]   Fax:  [phone]  Physical Therapy Treatment  Patient Details  Name: Francisco [name] MRN: [id number] Date of Birth: [birth year] Referring Provider (PT): Lauraine Rinne, PA-C   Encounter Date: 08/27/2020   PT End of Session - 08/27/20 1231    Visit Number 2    Number of Visits 25    Date for PT Re-Evaluation 11/17/20    Authorization Type [employer] Medicare    Authorization Time Period 08/25/20-11/17/20    PT Start Time 0930    PT Stop Time 1014    PT Time Calculation (min) 44 min    Equipment Utilized During Treatment Gait belt    Activity Tolerance Patient tolerated treatment well;No increased pain;Patient limited by fatigue    Behavior During Therapy [facility] for tasks assessed/performed           Past Medical History:  Diagnosis Date   Arthritis    lower left hip   Atypical angina (HCC)    Bilateral hand numbness    from back surgery   Bronchitis, chronic (HCC)    Cancer ([place])    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; Merkel cell cancer, and Basal cell cancer (twice; 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   Carotid stenosis    CKD (chronic kidney disease) stage 3, GFR 30-59 ml/min (HCC)    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   DDD (degenerative disc disease), cervical    Hypercholesterolemia    Hypertension    Hypothyroidism    pt takes Levothyroxine daily   Lumbosacral spinal stenosis    Myasthenia gravis, adult form ([place])    PAD (peripheral artery disease) (HCC)    Shortness of breath    Lung MD- Dr Darlin Coco   Sleep apnea    do not use CPAP every night    Past Surgical History:  Procedure Laterality Date   ANTERIOR CERVICAL DECOMP/DISCECTOMY FUSION  07/18/2011   Procedure: ANTERIOR CERVICAL DECOMPRESSION/DISCECTOMY FUSION 2 LEVELS;  Surgeon: Cooper Render Pool;  Location: [place] [facility];  Service: Neurosurgery;   Laterality: N/A;  cervical five-six, cervical six-seven anterior cervical discectomy and fusion   BACK SURGERY     in [place]     01/2020 Right, 04/2020 Left   CARDIAC CATHETERIZATION     2005 at [facility], no stents   CATARACT EXTRACTION W/PHACO Left 01/06/2020   Procedure: CATARACT EXTRACTION PHACO AND INTRAOCULAR LENS PLACEMENT (IOC) ISTENT INJ LEFT 3.81  00:33.3;  Surgeon: Eulogio Bear, MD;  Location: [place];  Service: Ophthalmology;  Laterality: Left;   CATARACT EXTRACTION W/PHACO Right 02/03/2020   Procedure: CATARACT EXTRACTION PHACO AND INTRAOCULAR LENS PLACEMENT ([place]) RIGHT ISTENT INJ;  Surgeon: Eulogio Bear, MD;  Location: [place];  Service: Ophthalmology;  Laterality: Right;  4.29 0:35.6   COLONOSCOPY     HERNIA REPAIR Left    inguinal hernia repair in 1985   LUMBAR LAMINECTOMY/DECOMPRESSION MICRODISCECTOMY Left 02/24/2014   Procedure: LUMBAR LAMINECTOMY/DECOMPRESSION MICRODISCECTOMY LUMBAR THREE-FOUR, FOUR-FIVE, LEFT FIVE-SACRAL ONE ;  Surgeon: Charlie Pitter, MD;  Location: [place] [facility]UR[facility] ORS;  Service: Neurosurgery;  Laterality: Left;  LUMBAR LAMINECTOMY/DECOMPRESSION MICRODISCECTOMY LUMBAR THREE-FOUR, FOUR-FIVE, LEFT FIVE-SACRAL ONE    POSTERIOR CERVICAL FUSION/FORAMINOTOMY N/A 08/07/2020   Procedure: C3-6 POSTERIOR FUSION WITH DECOMPRESSION;  Surgeon: Meade Maw, MD;  Location: [facility] ORS;  Service: Neurosurgery;  Laterality: N/A;  PROSTATECTOMY  8/14   [facility] Dr Mare Ferrari     There were no vitals filed for this visit.   Subjective Assessment - 08/27/20 0935    Subjective Patient went to his doctor yesterday and was given a good report and having normal healing rate. Will go in on the 23rd to see if he can get the brace off. No falls or LOB since last session.    Pertinent History Francisco [name] is a 72yoM referred to OPPT neuro s/p myelopathy s/p cervical decompression and fusion. Pt  sustained several months of general decline in mobility prior to surgical decompression. Pt went to inpatient rehab after hospitalization. [facility].  Pt underwent C3-6 decompression, fusion on 11/26. PMH: myasthenia gravis, 2012 ACDF 5/6, 6/7; OSA, hypothyroidism, HTN, COPD, multiple back surgeries, 2021 carpal tunnel release, referred to OPOT, but only seen for evaluation. 8/10 OPPT evaluation for LSS c radiating pain into the left hip, only completed evaluation session. September - November progressive decline in leg strength and tolerance to upright activity. Also noted a decline in tool manipulation for ADL (pen and meal utensil). Seen by OT and PT as an outpatient for other potential etiology (Carpal tunnel syndrome, and lumbar spinal stenosis), but eventually identified to be related to cervical myelopathy. Several months prior has no limitations in mobility or function. Pt scheduled for Jan 6th: Carotid endarterectomy c Dr. Leotis Pain. Pt has a long history of lumbar DJD, s/p L5/S1 decompression discectomy x2, L5 nerve root injections. Most recent Lumbar MRI (11/10/18) revealin gof advanced formaminal stenosis of L3/4, L4/5, L5/S1. Pt is has had LLE neurological weakness and pain since 2015 and has been followed by neurosurgery.    How long can you sit comfortably? Not limited    How long can you stand comfortably? 3-5 minutes    How long can you walk comfortably? 10 minutes    Patient Stated Goals return to functional baseline status of Summer 2021.    Currently in Pain? No/denies             BP at start of session: 90/47    [facility] PT Assessment - 08/27/20 0001      Standardized Balance Assessment   Standardized Balance Assessment Berg Balance Test      Berg Balance Test   Sit to Stand Able to stand  independently using hands    Standing Unsupported Able to stand 2 minutes with supervision    Sitting with Back Unsupported but Feet Supported on Floor or Stool Able to  sit safely and securely 2 minutes    Stand to Sit Uses backs of legs against chair to control descent    Transfers Able to transfer with verbal cueing and /or supervision    Standing Unsupported with Eyes Closed Able to stand 3 seconds    Standing Unsupported with Feet Together Needs help to attain position but able to stand for 30 seconds with feet together    From Standing, Reach Forward with Outstretched Arm Can reach forward >5 cm safely (2")    From Standing Position, Pick up Object from Floor Unable to pick up and needs supervision    From Standing Position, Turn to Look Behind Over each Shoulder Turn sideways only but maintains balance    Turn 360 Degrees Needs assistance while turning    Standing Unsupported, Alternately Place Feet on Step/Stool Able to complete >2 steps/needs minimal assist    Standing Unsupported, One Foot in Front Needs help to  step but can hold 15 seconds    Standing on One Leg Unable to try or needs assist to prevent fall    Total Score 24         Goals:  6MWT: 120 ft with seated rest break, increasing bilateral LE collapse and two seated rest breaks.  BERG: 24/ 56    Give HEP :  Patient demonstrates understanding cues for sequencing and safety awareness provided.      Access Code: [email or web address] URL: [email or web address] Date: 08/27/2020 Prepared by: Janna Arch  Exercises Seated Long Arc Quad - 1 x daily - 7 x weekly - 2 sets - 10 reps - 5 hold Seated Heel Toe Raises - 1 x daily - 7 x weekly - 2 sets - 10 reps - 5 hold Seated March with Resistance - 1 x daily - 7 x weekly - 2 sets - 10 reps - 5 hold Standing Hip Extension with Counter Support - 1 x daily - 7 x weekly - 2 sets - 10 reps - 5 hold Sit to Stand without Arm Support - 1 x daily - 7 x weekly - 2 sets - 10 reps - 5 hold Seated Hip Abduction with Resistance - 1 x daily - 7 x weekly - 2 sets - 10 reps - 5 hold   Patient educated on new HEP and demonstrated understanding. Six  minute walk test and BERG performed with patient limited with capacity for mobility. As patient ambulated he demonstrated increased bilateral knee buckling and near LOB with AD.  His balance is limited as seen in BERG score of 24/56. Patient will benefit from skilled physical therapy to reduce fall risk, improve strength and mobility, and return to PLOF.                    PT Education - 08/27/20 1230    Education provided Yes    Education Details exercise technique, HEP    Person(s) Educated Patient    Methods Demonstration;Tactile cues;Verbal cues;Explanation    Comprehension Verbalized understanding;Returned demonstration;Verbal cues required;Tactile cues required            PT Short Term Goals - 08/25/20 1500      PT SHORT TERM GOAL #1   Title After 4 weeks patient will be independent in HEP to progress strength, AMB, and balance.    Time 4    Period Weeks    Status New    Target Date 09/22/20      PT SHORT TERM GOAL #2   Title After 4 weeks pt will demonstrate improved 5xSTS hands free chair + airex foam in <20sec    Time 4    Period Weeks    Status New    Target Date 09/22/20             PT Long Term Goals - 08/27/20 1227      PT LONG TERM GOAL #1   Title After 8 weeks pt to demonstrate FOTO score >65 to demonstrate improved capacity to participate in ADL/IADL actiivtry.    Baseline 51 at evaluation.    Time 8    Period Weeks    Status New    Target Date 10/20/20      PT LONG TERM GOAL #2   Title After 8 weeks pt will demonstrate 6MWT (LRAD) >767ft to demonstrate improved capacity to access the community.    Baseline 12/16: 120 ft    Time 8    Period Weeks  Status New    Target Date 10/20/20      PT LONG TERM GOAL #3   Title After 8 weeks pt to demonstrate improved 5xSTS from chair + airex hands free <15 seconds.    Time 8    Period Weeks    Status New    Target Date 10/20/20      PT LONG TERM GOAL #4   Title After 12 weeks pt to  demonstrate improved FOTO score >70, BBT>46/56    Time 12    Period Weeks    Status New      PT LONG TERM GOAL #5   Title After 12 weeks pt will demonstrate improved 6MWT>1243ft c LRAD to improve safety in community AMB.    Time 12    Period Weeks    Status New    Target Date 10/20/20                 Plan - 08/27/20 1304    Clinical Impression Statement Patient educated on new HEP and demonstrated understanding. Six minute walk test and BERG performed with patient limited with capacity for mobility. As patient ambulated he demonstrated increased bilateral knee buckling and near LOB with AD.  His balance is limited as seen in BERG score of 24/56. Patient will benefit from skilled physical therapy to reduce fall risk, improve strength and mobility, and return to PLOF.    Personal Factors and Comorbidities Age;Comorbidity 2;Past/Current Experience;Time since onset of injury/illness/exacerbation    Comorbidities COPD, History of Cancer, myasthenia gravis, chronic lumbar surgical history.    Examination-Activity Limitations Lift;Squat;Bend;Stairs;Stand;Transfers;Insurance claims handler;Bathing;Hygiene/Grooming;Dressing;Toileting    Examination-Participation Restrictions Yard Work;Church;Cleaning;Driving    Stability/Clinical Decision Making Evolving/Moderate complexity    Rehab Potential Good    Clinical Impairments Affecting Rehab Potential PMH    PT Frequency 2x / week    PT Duration 12 weeks    PT Treatment/Interventions ADLs/Self Care Home Management;Electrical Stimulation;Moist Heat;Traction;Ultrasound;Gait training;Stair training;Functional mobility training;Therapeutic activities;Therapeutic exercise;Balance training;Neuromuscular re-education;Patient/family education;Manual techniques;Passive range of motion;Dry needling;Joint Manipulations;Cryotherapy    PT Next Visit Plan balance, strength    PT Home Exercise Plan none at eval    Consulted and Agree with Plan of  Care Patient           Patient will benefit from skilled therapeutic intervention in order to improve the following deficits and impairments:  Abnormal gait,Decreased balance,Decreased mobility,Decreased endurance,Difficulty walking,Hypomobility,Impaired sensation,Decreased range of motion,Impaired perceived functional ability,Decreased activity tolerance,Decreased coordination,Decreased strength,Impaired flexibility,Postural dysfunction,Pain  Visit Diagnosis: Cervical myelopathy (HCC)  Chronic left-sided low back pain with left-sided sciatica  Muscle weakness (generalized)  Unsteadiness on feet  Other lack of coordination     Problem List Patient Active Problem List   Diagnosis Date Noted   S/P cervical spinal fusion    Leukocytosis    Essential hypertension    Anemia of chronic disease    Postoperative pain    Neuropathic pain    Cervical myelopathy (HCC) 08/07/2020   Leg weakness, bilateral 07/13/2020   Myalgia 10/30/2019   PAD (peripheral artery disease) ([place]) 06/06/2019   CKD (chronic kidney disease) stage 3, GFR 30-59 ml/min ([place]) 04/29/2019   B12 deficiency 01/23/2019   Left arm numbness 02/28/2018   Neck pain 02/28/2018   Anemia 02/02/2017   DDD (degenerative disc disease), cervical 02/02/2017   Hypothyroid 02/02/2017   MRSA (methicillin resistant staph aureus) culture positive 02/02/2017   Nocturnal hypoxia 02/02/2017   Senile purpura ([place]) 10/03/2016   Essential hypertension, benign 09/16/2016   Bilateral  carotid artery disease ([place]) 09/16/2016   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/18/2016   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   History of prostate cancer 12/21/2015   Left carpal tunnel syndrome 11/11/2015   Kidney stone on left side 07/05/2015   Myasthenia gravis ([place]) 05/26/2015   Long-term use of high-risk medication 10/15/2014   Persistent cough 09/10/2014   Pure hypercholesterolemia 07/25/2014   Spinal stenosis,  lumbar region, with neurogenic claudication 02/24/2014   Lumbosacral stenosis with neurogenic claudication ([place] ) 02/24/2014   COPD (chronic obstructive pulmonary disease) ([place]) 01/22/2014   Janna Arch, PT, DPT    08/27/2020, 1:05 PM  [place] MAIN [facility] SERVICES [address] [place], [place], [address] Phone: [phone]   Fax:  [phone]  Name: Francisco [name] MRN: [id number] Date of Birth: 1945-12-26

## 2020-08-28 ENCOUNTER — Encounter: Payer: Medicare Other | Admitting: Physical Medicine and Rehabilitation

## 2020-08-29 ENCOUNTER — Other Ambulatory Visit: Payer: Self-pay | Admitting: Physical Medicine and Rehabilitation

## 2020-09-02 ENCOUNTER — Ambulatory Visit: Payer: Medicare Other

## 2020-09-08 ENCOUNTER — Other Ambulatory Visit: Payer: Self-pay

## 2020-09-08 ENCOUNTER — Encounter: Payer: Self-pay | Admitting: Occupational Therapy

## 2020-09-08 ENCOUNTER — Ambulatory Visit: Payer: Medicare Other

## 2020-09-08 ENCOUNTER — Ambulatory Visit: Payer: Medicare Other | Admitting: Occupational Therapy

## 2020-09-08 DIAGNOSIS — M6281 Muscle weakness (generalized): Secondary | ICD-10-CM

## 2020-09-08 DIAGNOSIS — R2681 Unsteadiness on feet: Secondary | ICD-10-CM

## 2020-09-08 DIAGNOSIS — G959 Disease of spinal cord, unspecified: Secondary | ICD-10-CM

## 2020-09-08 DIAGNOSIS — M5442 Lumbago with sciatica, left side: Secondary | ICD-10-CM

## 2020-09-08 DIAGNOSIS — G8929 Other chronic pain: Secondary | ICD-10-CM

## 2020-09-08 DIAGNOSIS — R278 Other lack of coordination: Secondary | ICD-10-CM

## 2020-09-08 NOTE — Therapy (Signed)
[facility] MAIN [facility] SERVICES [address] [place], [place], [address] Phone: [phone]   Fax:  [phone]  Occupational Therapy Treatment  Patient Details  Name: Francisco [name] MRN: [id number] Date of Birth: [birth year] Referring Provider (OT): Dr Jordan Likes   Encounter Date: 09/08/2020   OT End of Session - 09/08/20 1716    Visit Number 2    Number of Visits 12    Date for OT Re-Evaluation 10/09/20    OT Start Time 1345    OT Stop Time 1430    OT Time Calculation (min) 45 min    Activity Tolerance Patient tolerated treatment well    Behavior During Therapy [facility] for tasks assessed/performed           Past Medical History:  Diagnosis Date  . Arthritis    lower left hip  . Atypical angina (HCC)   . Bilateral hand numbness    from back surgery  . Bronchitis, chronic (HCC)   . Cancer [facility])    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; Merkel cell cancer, and Basal cell cancer (twice; 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  . Carotid stenosis   . CKD (chronic kidney disease) stage 3, GFR 30-59 ml/min (HCC)   . 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 DDD (degenerative disc disease), cervical   . Hypercholesterolemia   . Hypertension   . Hypothyroidism    pt takes Levothyroxine daily  . Lumbosacral spinal stenosis   . Myasthenia gravis, adult form (HCC)   . PAD (peripheral artery disease) (HCC)   . Shortness of breath    Lung MD- Dr Joetta Manners  . Sleep apnea    do not use CPAP every night    Past Surgical History:  Procedure Laterality Date  . ANTERIOR CERVICAL DECOMP/DISCECTOMY FUSION  07/18/2011   Procedure: ANTERIOR CERVICAL DECOMPRESSION/DISCECTOMY FUSION 2 LEVELS;  Surgeon: Kathaleen Maser Pool;  Location: [facility];  Service: Neurosurgery;  Laterality: N/A;  cervical five-six, cervical six-seven anterior cervical discectomy and fusion  . BACK SURGERY     in 1985 [facility]  . BILATERAL CARPAL TUNNEL RELEASE     01/2020 Right,  04/2020 Left  . CARDIAC CATHETERIZATION     2005 at [facility], no stents  . CATARACT EXTRACTION W/PHACO Left 01/06/2020   Procedure: CATARACT EXTRACTION PHACO AND INTRAOCULAR LENS PLACEMENT (IOC) ISTENT INJ LEFT 3.81  00:33.3;  Surgeon: Nevada Crane, MD;  Location: [facility];  Service: Ophthalmology;  Laterality: Left;  . CATARACT EXTRACTION W/PHACO Right 02/03/2020   Procedure: CATARACT EXTRACTION PHACO AND INTRAOCULAR LENS PLACEMENT (IOC) RIGHT ISTENT INJ;  Surgeon: Nevada Crane, MD;  Location: [facility];  Service: Ophthalmology;  Laterality: Right;  4.29 0:35.6  . COLONOSCOPY    . HERNIA REPAIR Left    inguinal hernia repair in 1985  . LUMBAR LAMINECTOMY/DECOMPRESSION MICRODISCECTOMY Left 02/24/2014   Procedure: LUMBAR LAMINECTOMY/DECOMPRESSION MICRODISCECTOMY LUMBAR THREE-FOUR, FOUR-FIVE, LEFT FIVE-SACRAL ONE ;  Surgeon: Temple Pacini, MD;  Location: [facility];  Service: Neurosurgery;  Laterality: Left;  LUMBAR LAMINECTOMY/DECOMPRESSION MICRODISCECTOMY LUMBAR THREE-FOUR, FOUR-FIVE, LEFT FIVE-SACRAL ONE   . POSTERIOR CERVICAL FUSION/FORAMINOTOMY N/A 08/07/2020   Procedure: C3-6 POSTERIOR FUSION WITH DECOMPRESSION;  Surgeon: Venetia Night, MD;  Location: [facility] ORS;  Service: Neurosurgery;  Laterality: N/A;  . PROSTATECTOMY  8/14   [facility] Dr Joycelyn Das     There were no vitals filed for this visit.   Subjective Assessment - 09/08/20 1714  Subjective  Pt. reports that he had a follwo-up MD appointment, and that he is able to remove his cevrical collar more frequently now.    Pertinent History Pt is a 74 y/o M with hx of cervical myelopathy & admitted on 08/07/20 for scheduled C3-6 Posterior fusion with decompression by Dr. Izora Ribas. PMH: atypical angina, PAD, carotid stenosis, HTN, HLD, COPD, Myasthenia gravis, hypothyroidism, CKD-III, OSA requiring nocturnal PAP therapy, OA, cervical DDD, lumbosacral spinal stenosis, chronic DOE 2/2 COPD.  Pt went to IP rehab  and was discharged on 08/20/2020 with orders for OP OT and PT.  History of carpal tunnel bilaterally.    Patient Stated Goals Pt would like to be able to walk again, and be independent as he was before.    Currently in Pain? No/denies          OT TREATMENT   Therapeutic Exercise:  Pt. performed bilateral gross gripping with grip strengthener. Pt. worked on sustaining grip while grasping pegs and reaching at various heights. Gripper was set at 64.9# of grip strength force. Pt. Worked on pinch strengthening for bilateral hands. Pt. Performed lateral, and 3pt. pinch using yellow, red, green, and blue resistive clips. Pt. worked on placing the clips at various vertical and horizontal angles. Tactile and verbal cues were required for eliciting the desired movement.  Neuromuscular reeducation:  Pt. performed [facility] tasks using the Grooved pegboard. Pt. worked on grasping the grooved pegs from a horizontal position, and moving the pegs to a vertical position in the hand to prepare for placing them in the grooved slot. Pt. worked on performed translatory movements storing the objects in his palm, moving them from his palm to the tip of his 2nd digit, and turning them in his fingers in preparation for placing them into the pegboard.  Pt. required verbal cues, and cues for visual demonstration for movement patterns, and placement of the clips in his hand. Pt. presented with increased difficulty performing translatory movements with his left hand, requiring the task to be modified to one peg at a time. Pt. continues to work on improving UE strength, and [facility] skills in order to work towards improving BUE strength, motor control, and [facility] skills in order to maximize independence with ADLs, and IADLs.                         OT Education - 09/08/20 1716    Education Details role of OT, goals, POC    Person(s) Educated Patient    Methods Explanation;Demonstration;Handout;Verbal cues;Tactile  cues    Comprehension Verbalized understanding;Returned demonstration;Verbal cues required               OT Long Term Goals - 08/25/20 1553      OT LONG TERM GOAL #1   Title Patient to be independent with home exercise program for strengthening and coordination skills.    Baseline decreased strength and coordination bilaterally after cervical surgery    Time 6    Period Weeks    Status New    Target Date 10/09/20      OT LONG TERM GOAL #2   Title Patient will improve grip strength bilaterally by 8# to hold objects securely without dropping.    Baseline see flow sheet for details.    Time 6    Period Weeks    Status New    Target Date 10/10/20      OT LONG TERM GOAL #3   Title Patient will  perform self care tasks with modified independence.    Baseline supervision at time of evaluation.    Time 3    Period Weeks    Status New    Target Date 09/18/20      OT LONG TERM GOAL #4   Title Pt will complete light homemaking tasks with modified independence.    Baseline wife assisting at eval    Time 6    Period Weeks    Status New    Target Date 10/09/20      OT LONG TERM GOAL #5   Title Patient will improve bilateral shoulder ROM and strength to place items on shelves at shoulder height with modified independence.    Baseline unable at eval    Time 6    Period Weeks    Status New    Target Date 10/09/20      Long Term Additional Goals   Additional Long Term Goals Yes      OT LONG TERM GOAL #6   Title Pt will improve left UE coordination by decreasing time on 9 hole peg test by 10 secs to be able to manipulate and complete buttons with modified independence.    Baseline difficulty at eval, see flow sheet for measurements    Time 6    Period Weeks    Status New    Target Date 10/09/20                 Plan - 09/08/20 1717    Clinical Impression Statement Pt. required verbal cues, and cues for visual demonstration for movement patterns, and placement of  the clips in his hand. Pt. presented with increased difficulty performing translatory movements with his left hand, requiring the task to be modified to one peg at a time. Pt. continues to work on improving UE strength, and [facility] skills in order to work towards improving BUE strength, motor control, and [facility] skills in order to maximize independence with ADLs, and IADLs.    OT Occupational Profile and History Detailed Assessment- Review of Records and additional review of physical, cognitive, psychosocial history related to current functional performance    Occupational performance deficits (Please refer to evaluation for details): ADL's;IADL's;Leisure;Work    Marketing executive / Function / Physical Skills ADL;Flexibility;ROM;UE functional use;FMC;Dexterity;Sensation;Strength;IADL;Coordination;Mobility;Decreased knowledge of use of DME;Balance;Endurance    Psychosocial Skills Environmental  Adaptations;Habits;Routines and Behaviors    Rehab Potential Good    Clinical Decision Making Several treatment options, min-mod task modification necessary    Comorbidities Affecting Occupational Performance: Presence of comorbidities impacting occupational performance    Comorbidities impacting occupational performance description: age, carpal tunnel history, COPD, chronic lumbar surgical history, history of myasthenia gravis, has a full time job and working currently, unable to drive    Modification or Assistance to Complete Evaluation  No modification of tasks or assist necessary to complete eval    OT Frequency 2x / week    OT Duration 6 weeks    OT Treatment/Interventions Self-care/ADL training;Therapeutic exercise;Neuromuscular education;Patient/family education;Therapeutic activities;Cryotherapy;Functional Mobility Training;DME and/or AE instruction;Balance training;Moist Heat    Consulted and Agree with Plan of Care Patient           Patient will benefit from skilled therapeutic intervention in order to  improve the following deficits and impairments:   Body Structure / Function / Physical Skills: ADL,Flexibility,ROM,UE functional use,FMC,Dexterity,Sensation,Strength,IADL,Coordination,Mobility,Decreased knowledge of use of DME,Balance,Endurance   Psychosocial Skills: Environmental  Adaptations,Habits,Routines and Behaviors   Visit Diagnosis: Muscle weakness (generalized)  Other lack  of coordination    Problem List Patient Active Problem List   Diagnosis Date Noted  . S/P cervical spinal fusion   . Leukocytosis   . Essential hypertension   . Anemia of chronic disease   . Postoperative pain   . Neuropathic pain   . Cervical myelopathy ([place]) 08/07/2020  . Leg weakness, bilateral 07/13/2020  . Myalgia 10/30/2019  . PAD (peripheral artery disease) ([place]) 06/06/2019  . CKD (chronic kidney disease) stage 3, GFR 30-59 ml/min (HCC) 04/29/2019  . B12 deficiency 01/23/2019  . Left arm numbness 02/28/2018  . Neck pain 02/28/2018  . Anemia 02/02/2017  . DDD (degenerative disc disease), cervical 02/02/2017  . Hypothyroid 02/02/2017  . MRSA (methicillin resistant staph aureus) culture positive 02/02/2017  . Nocturnal hypoxia 02/02/2017  . Senile purpura ([place]) 10/03/2016  . Essential hypertension, benign 09/16/2016  . Bilateral carotid artery disease ([place]) 09/16/2016  . 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/18/2016  . 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 History of prostate cancer 12/21/2015  . Left carpal tunnel syndrome 11/11/2015  . Kidney stone on left side 07/05/2015  . Myasthenia gravis ([place]) 05/26/2015  . Long-term use of high-risk medication 10/15/2014  . Persistent cough 09/10/2014  . Pure hypercholesterolemia 07/25/2014  . Spinal stenosis, lumbar region, with neurogenic claudication 02/24/2014  . Lumbosacral stenosis with neurogenic claudication ([place]) 02/24/2014  . COPD (chronic obstructive pulmonary disease) ([place]) 01/22/2014    Harrel Carina, MS,  OTR/L 09/08/2020, 5:19 PM  [place] MAIN [facility] SERVICES [address] [place], [place], [address] Phone: [phone]   Fax:  [phone]  Name: Francisco [name] MRN: [id number] Date of Birth: 09-05-1946

## 2020-09-08 NOTE — Therapy (Signed)
[place] MAIN [facility] SERVICES [address] [place], [place], [address] Phone: [phone]   Fax:  [phone]  Physical Therapy Treatment  Patient Details  Name: Francisco [name] MRN: [id number] Date of Birth: [birth year] Referring Provider (PT): Lauraine Rinne, PA-C   Encounter Date: 09/08/2020   PT End of Session - 09/08/20 1627    Visit Number 3    Number of Visits 25    Date for PT Re-Evaluation 11/17/20    Authorization Type [employer] Medicare    Authorization Time Period 08/25/20-11/17/20    PT Start Time 1300    PT Stop Time 1345    PT Time Calculation (min) 45 min    Equipment Utilized During Treatment Gait belt    Activity Tolerance Patient tolerated treatment well;No increased pain;Patient limited by fatigue    Behavior During Therapy [facility] for tasks assessed/performed           Past Medical History:  Diagnosis Date  . Arthritis    lower left hip  . Atypical angina ([place])   . Bilateral hand numbness    from back surgery  . Bronchitis, chronic ([place])   . Cancer [facility])    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; Merkel cell cancer, and Basal cell cancer (twice; 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  . Carotid stenosis   . CKD (chronic kidney disease) stage 3, GFR 30-59 ml/min (HCC)   . 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 DDD (degenerative disc disease), cervical   . Hypercholesterolemia   . Hypertension   . Hypothyroidism    pt takes Levothyroxine daily  . Lumbosacral spinal stenosis   . Myasthenia gravis, adult form ([place])   . PAD (peripheral artery disease) ([place])   . Shortness of breath    Lung MD- Dr Darlin Coco  . Sleep apnea    do not use CPAP every night    Past Surgical History:  Procedure Laterality Date  . ANTERIOR CERVICAL DECOMP/DISCECTOMY FUSION  07/18/2011   Procedure: ANTERIOR CERVICAL DECOMPRESSION/DISCECTOMY FUSION 2 LEVELS;  Surgeon: Cooper Render Pool;  Location: [place] [facility];  Service: Neurosurgery;   Laterality: N/A;  cervical five-six, cervical six-seven anterior cervical discectomy and fusion  . BACK SURGERY     in [place]     01/2020 Right, 04/2020 Left  . CARDIAC CATHETERIZATION     2005 at [facility], no stents  . CATARACT EXTRACTION W/PHACO Left 01/06/2020   Procedure: CATARACT EXTRACTION PHACO AND INTRAOCULAR LENS PLACEMENT (IOC) ISTENT INJ LEFT 3.81  00:33.3;  Surgeon: Eulogio Bear, MD;  Location: [place];  Service: Ophthalmology;  Laterality: Left;  . CATARACT EXTRACTION W/PHACO Right 02/03/2020   Procedure: CATARACT EXTRACTION PHACO AND INTRAOCULAR LENS PLACEMENT ([place]) RIGHT ISTENT INJ;  Surgeon: Eulogio Bear, MD;  Location: [place];  Service: Ophthalmology;  Laterality: Right;  4.29 0:35.6  . COLONOSCOPY    . HERNIA REPAIR Left    inguinal hernia repair in 1985  . LUMBAR LAMINECTOMY/DECOMPRESSION MICRODISCECTOMY Left 02/24/2014   Procedure: LUMBAR LAMINECTOMY/DECOMPRESSION MICRODISCECTOMY LUMBAR THREE-FOUR, FOUR-FIVE, LEFT FIVE-SACRAL ONE ;  Surgeon: Charlie Pitter, MD;  Location: [place] [facility];  Service: Neurosurgery;  Laterality: Left;  LUMBAR LAMINECTOMY/DECOMPRESSION MICRODISCECTOMY LUMBAR THREE-FOUR, FOUR-FIVE, LEFT FIVE-SACRAL ONE   . POSTERIOR CERVICAL FUSION/FORAMINOTOMY N/A 08/07/2020   Procedure: C3-6 POSTERIOR FUSION WITH DECOMPRESSION;  Surgeon: Meade Maw, MD;  Location: [facility]S;  Service: Neurosurgery;  Laterality: N/A;  .  PROSTATECTOMY  8/14   [facility] Dr Mare Ferrari     There were no vitals filed for this visit.   Subjective Assessment - 09/08/20 1253    Subjective Pt states he is overall feeling better since having the surgery.  He is walking a little more at home with his walker.  He states he left his cervical collar in the car and is allowed to wear it intermittently.    Pertinent History Francisco [name] is a 1yoM referred to OPPT neuro s/p myelopathy s/p cervical decompression  and fusion. Pt sustained several months of general decline in mobility prior to surgical decompression. Pt went to inpatient rehab after hospitalization. [facility].  Pt underwent C3-6 decompression, fusion on 11/26. PMH: myasthenia gravis, 2012 ACDF 5/6, 6/7; OSA, hypothyroidism, HTN, COPD, multiple back surgeries, 2021 carpal tunnel release, referred to OPOT, but only seen for evaluation. 8/10 OPPT evaluation for LSS c radiating pain into the left hip, only completed evaluation session. September - November progressive decline in leg strength and tolerance to upright activity. Also noted a decline in tool manipulation for ADL (pen and meal utensil). Seen by OT and PT as an outpatient for other potential etiology (Carpal tunnel syndrome, and lumbar spinal stenosis), but eventually identified to be related to cervical myelopathy. Several months prior has no limitations in mobility or function. Pt scheduled for Jan 6th: Carotid endarterectomy c Dr. Leotis Pain. Pt has a long history of lumbar DJD, s/p L5/S1 decompression discectomy x2, L5 nerve root injections. Most recent Lumbar MRI (11/10/18) revealin gof advanced formaminal stenosis of L3/4, L4/5, L5/S1. Pt is has had LLE neurological weakness and pain since 2015 and has been followed by neurosurgery.    How long can you sit comfortably? Not limited    How long can you stand comfortably? 3-5 minutes    How long can you walk comfortably? 10 minutes    Patient Stated Goals return to functional baseline status of Summer 2021.    Pain Score 0-No pain            Treatment Today: Pt ed about what type of sneakers/shoes would be better for PT tx as the slippers he wore today are a fall risk; he verbalized he will wear his new balance at next session.    Seated:    -Sit to stand 3x5 with RW; PT manually blocking feet from sliding as pt wore slippers to PT  -Seated knee extension: 3# L 2x12, 4# R 2x12; L LE fatigues quickly and pt lacks  eccentric control   -Seated ankle DF L 2x10; through available ROM and PT tactile cues for ant tib Foot placement onto step  Standing:  -Standing marches: 2x10 ea; PT stabilizing L anterior knee from buckling, pt leaning heavily on RW  -standing lateral walking in parallel bars, 4 laps  -forward walking in parallel bars, 4 laps  -pt amb 2x50 ft with RW, PT CGA, observed decreased foot clearance and narrow base of support  Balance: -Airex- self selected base of support 3 trials x 30 sec, pt intermittently holding hand rail in // bars  -Airex- tandem stance 2 trials ea direction x 30 sec, pt intermittently holding hand rails to regain balance, PT also CGA to regain balance, pt had forward and backwards direction LOB  Pt education: Per chart review: MD note on 12/23: "We reviewed his activity limitations. He is free to go without his collar when he is inside his house. When he is at  risk of falls or outside of his house, I have asked that he wear his collar."  Pt would benefit from reminder of MD post-op precautions as he did not wear collar to PT today.  He also would benefit from reminder of appropriate footwear for PT as he wore slippers which increased risk for falling.       PT Education - 09/08/20 1626    Education provided Yes    Education Details wearing sneakers to PT instead of his house slippers, execise technique/body mechanics    Person(s) Educated Patient    Methods Explanation;Demonstration;Tactile cues;Verbal cues    Comprehension Verbal cues required;Need further instruction;Returned demonstration;Verbalized understanding            PT Short Term Goals - 08/25/20 1500      PT SHORT TERM GOAL #1   Title After 4 weeks patient will be independent in HEP to progress strength, AMB, and balance.    Time 4    Period Weeks    Status New    Target Date 09/22/20      PT SHORT TERM GOAL #2   Title After 4 weeks pt will demonstrate improved 5xSTS hands free chair +  airex foam in <20sec    Time 4    Period Weeks    Status New    Target Date 09/22/20             PT Long Term Goals - 08/27/20 1227      PT LONG TERM GOAL #1   Title After 8 weeks pt to demonstrate FOTO score >65 to demonstrate improved capacity to participate in ADL/IADL actiivtry.    Baseline 51 at evaluation.    Time 8    Period Weeks    Status New    Target Date 10/20/20      PT LONG TERM GOAL #2   Title After 8 weeks pt will demonstrate 6MWT (LRAD) >795ft to demonstrate improved capacity to access the community.    Baseline 12/16: 120 ft    Time 8    Period Weeks    Status New    Target Date 10/20/20      PT LONG TERM GOAL #3   Title After 8 weeks pt to demonstrate improved 5xSTS from chair + airex hands free <15 seconds.    Time 8    Period Weeks    Status New    Target Date 10/20/20      PT LONG TERM GOAL #4   Title After 12 weeks pt to demonstrate improved FOTO score >70, BBT>46/56    Time 12    Period Weeks    Status New      PT LONG TERM GOAL #5   Title After 12 weeks pt will demonstrate improved 6MWT>1237ft c LRAD to improve safety in community AMB.    Time 12    Period Weeks    Status New    Target Date 10/20/20                 Plan - 09/08/20 1628    Clinical Impression Statement Pt's L LE fatigues quickly during seated and standing exercises; PT manually stabilized ant knee and cued pt to activate quadriceps during standing exercises to avoid knee buckling.  He was very unsteady with stationary standing on airex today and required use of UE support and PT CGA to regain LOB.  Overall, he should continue to benefit from skilled PT to improve strength and balance to  reduce fall risk and return to PLOF.  Instructed pt to wear sneakers to next session as he was wearing slippers today which had a smooth slippery sole and were a fall risk.    Personal Factors and Comorbidities Age;Comorbidity 2;Past/Current Experience;Time since onset of  injury/illness/exacerbation    Comorbidities COPD, History of Cancer, myasthenia gravis, chronic lumbar surgical history.    Examination-Activity Limitations Lift;Squat;Bend;Stairs;Stand;Transfers;Probation officer;Bathing;Hygiene/Grooming;Dressing;Toileting    Examination-Participation Restrictions Yard Work;Church;Cleaning;Driving    Stability/Clinical Decision Making Evolving/Moderate complexity    Rehab Potential Good    Clinical Impairments Affecting Rehab Potential PMH    PT Frequency 2x / week    PT Duration 12 weeks    PT Treatment/Interventions ADLs/Self Care Home Management;Electrical Stimulation;Moist Heat;Traction;Ultrasound;Gait training;Stair training;Functional mobility training;Therapeutic activities;Therapeutic exercise;Balance training;Neuromuscular re-education;Patient/family education;Manual techniques;Passive range of motion;Dry needling;Joint Manipulations;Cryotherapy    PT Next Visit Plan balance, strength    PT Home Exercise Plan none at eval    Consulted and Agree with Plan of Care Patient           Patient will benefit from skilled therapeutic intervention in order to improve the following deficits and impairments:  Abnormal gait,Decreased balance,Decreased mobility,Decreased endurance,Difficulty walking,Hypomobility,Impaired sensation,Decreased range of motion,Impaired perceived functional ability,Decreased activity tolerance,Decreased coordination,Decreased strength,Impaired flexibility,Postural dysfunction,Pain  Visit Diagnosis: Cervical myelopathy (HCC)  Chronic left-sided low back pain with left-sided sciatica  Muscle weakness (generalized)  Unsteadiness on feet     Problem List Patient Active Problem List   Diagnosis Date Noted  . S/P cervical spinal fusion   . Leukocytosis   . Essential hypertension   . Anemia of chronic disease   . Postoperative pain   . Neuropathic pain   . Cervical myelopathy (HCC) 08/07/2020  . Leg  weakness, bilateral 07/13/2020  . Myalgia 10/30/2019  . PAD (peripheral artery disease) (HCC) 06/06/2019  . CKD (chronic kidney disease) stage 3, GFR 30-59 ml/min (HCC) 04/29/2019  . B12 deficiency 01/23/2019  . Left arm numbness 02/28/2018  . Neck pain 02/28/2018  . Anemia 02/02/2017  . DDD (degenerative disc disease), cervical 02/02/2017  . Hypothyroid 02/02/2017  . MRSA (methicillin resistant staph aureus) culture positive 02/02/2017  . Nocturnal hypoxia 02/02/2017  . Senile purpura (HCC) 10/03/2016  . Essential hypertension, benign 09/16/2016  . Bilateral carotid artery disease (HCC) 09/16/2016  . 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/18/2016  . 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 History of prostate cancer 12/21/2015  . Left carpal tunnel syndrome 11/11/2015  . Kidney stone on left side 07/05/2015  . Myasthenia gravis (HCC) 05/26/2015  . Long-term use of high-risk medication 10/15/2014  . Persistent cough 09/10/2014  . Pure hypercholesterolemia 07/25/2014  . Spinal stenosis, lumbar region, with neurogenic claudication 02/24/2014  . Lumbosacral stenosis with neurogenic claudication (HCC) 02/24/2014  . COPD (chronic obstructive pulmonary disease) (HCC) 01/22/2014    Ardine Bjork 09/08/2020, 4:37 PM Max Fickle, PT, DPT Physical Therapist - [facility]  Outpatient Physical Therapy- Main Campus [phone]    [facility] MAIN [facility] SERVICES [address] [place], [place], [address] Phone: [phone]   Fax:  [phone]  Name: Francisco [name] MRN: [id number] Date of Birth: 11-20-1945

## 2020-09-10 ENCOUNTER — Other Ambulatory Visit: Payer: Self-pay

## 2020-09-10 ENCOUNTER — Ambulatory Visit: Payer: Medicare Other

## 2020-09-10 DIAGNOSIS — G959 Disease of spinal cord, unspecified: Secondary | ICD-10-CM

## 2020-09-10 DIAGNOSIS — R278 Other lack of coordination: Secondary | ICD-10-CM

## 2020-09-10 DIAGNOSIS — G8929 Other chronic pain: Secondary | ICD-10-CM

## 2020-09-10 DIAGNOSIS — M6281 Muscle weakness (generalized): Secondary | ICD-10-CM

## 2020-09-10 NOTE — Therapy (Signed)
[place] MAIN [facility] SERVICES [address] [place], [place], [address] Phone: [phone]   Fax:  [phone]  Physical Therapy Treatment  Patient Details  Name: Francisco [name] MRN: [id number] Date of Birth: 1946-09-03 Referring Provider (PT): Lauraine Rinne, PA-C   Encounter Date: 09/10/2020   PT End of Session - 09/10/20 1252    Visit Number 4    Number of Visits 25    Date for PT Re-Evaluation 11/17/20    Authorization Type [employer] Medicare    Authorization Time Period 08/25/20-11/17/20    PT Start Time 1300    PT Stop Time 1344    PT Time Calculation (min) 44 min    Equipment Utilized During Treatment Gait belt    Activity Tolerance Patient tolerated treatment well;No increased pain;Patient limited by fatigue    Behavior During Therapy [facility] for tasks assessed/performed           Past Medical History:  Diagnosis Date  . Arthritis    lower left hip  . Atypical angina ([place])   . Bilateral hand numbness    from back surgery  . Bronchitis, chronic ([place])   . Cancer [facility])    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; Merkel cell cancer, and Basal cell cancer (twice; 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  . Carotid stenosis   . CKD (chronic kidney disease) stage 3, GFR 30-59 ml/min (HCC)   . 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 DDD (degenerative disc disease), cervical   . Hypercholesterolemia   . Hypertension   . Hypothyroidism    pt takes Levothyroxine daily  . Lumbosacral spinal stenosis   . Myasthenia gravis, adult form ([place])   . PAD (peripheral artery disease) ([place])   . Shortness of breath    Lung MD- Dr Darlin Coco  . Sleep apnea    do not use CPAP every night    Past Surgical History:  Procedure Laterality Date  . ANTERIOR CERVICAL DECOMP/DISCECTOMY FUSION  07/18/2011   Procedure: ANTERIOR CERVICAL DECOMPRESSION/DISCECTOMY FUSION 2 LEVELS;  Surgeon: Cooper Render Pool;  Location: [place] NEURO ORS;  Service: Neurosurgery;   Laterality: N/A;  cervical five-six, cervical six-seven anterior cervical discectomy and fusion  . BACK SURGERY     in [place]     01/2020 Right, 04/2020 Left  . CARDIAC CATHETERIZATION     2005 at [facility], no stents  . CATARACT EXTRACTION W/PHACO Left 01/06/2020   Procedure: CATARACT EXTRACTION PHACO AND INTRAOCULAR LENS PLACEMENT (IOC) ISTENT INJ LEFT 3.81  00:33.3;  Surgeon: Eulogio Bear, MD;  Location: [place];  Service: Ophthalmology;  Laterality: Left;  . CATARACT EXTRACTION W/PHACO Right 02/03/2020   Procedure: CATARACT EXTRACTION PHACO AND INTRAOCULAR LENS PLACEMENT ([place]) RIGHT ISTENT INJ;  Surgeon: Eulogio Bear, MD;  Location: [place];  Service: Ophthalmology;  Laterality: Right;  4.29 0:35.6  . COLONOSCOPY    . HERNIA REPAIR Left    inguinal hernia repair in 1985  . LUMBAR LAMINECTOMY/DECOMPRESSION MICRODISCECTOMY Left 02/24/2014   Procedure: LUMBAR LAMINECTOMY/DECOMPRESSION MICRODISCECTOMY LUMBAR THREE-FOUR, FOUR-FIVE, LEFT FIVE-SACRAL ONE ;  Surgeon: Charlie Pitter, MD;  Location: [place] NEURO ORS;  Service: Neurosurgery;  Laterality: Left;  LUMBAR LAMINECTOMY/DECOMPRESSION MICRODISCECTOMY LUMBAR THREE-FOUR, FOUR-FIVE, LEFT FIVE-SACRAL ONE   . POSTERIOR CERVICAL FUSION/FORAMINOTOMY N/A 08/07/2020   Procedure: C3-6 POSTERIOR FUSION WITH DECOMPRESSION;  Surgeon: Meade Maw, MD;  Location: [facility] ORS;  Service: Neurosurgery;  Laterality: N/A;  .  PROSTATECTOMY  8/14   [facility] Dr Mare Ferrari     There were no vitals filed for this visit.   Subjective Assessment - 09/10/20 1316    Subjective Patient is wearing his neck brace today. Was able to take 6 steps without aide today.  Patient reports a controlled fall on Tuesday, was able to pull himself upon the kitchen sink    Pertinent History Francisco [name] is a 1yoM referred to OPPT neuro s/p myelopathy s/p cervical decompression and fusion. Pt sustained several  months of general decline in mobility prior to surgical decompression. Pt went to inpatient rehab after hospitalization. [facility].  Pt underwent C3-6 decompression, fusion on 11/26. PMH: myasthenia gravis, 2012 ACDF 5/6, 6/7; OSA, hypothyroidism, HTN, COPD, multiple back surgeries, 2021 carpal tunnel release, referred to OPOT, but only seen for evaluation. 8/10 OPPT evaluation for LSS c radiating pain into the left hip, only completed evaluation session. September - November progressive decline in leg strength and tolerance to upright activity. Also noted a decline in tool manipulation for ADL (pen and meal utensil). Seen by OT and PT as an outpatient for other potential etiology (Carpal tunnel syndrome, and lumbar spinal stenosis), but eventually identified to be related to cervical myelopathy. Several months prior has no limitations in mobility or function. Pt scheduled for Jan 6th: Carotid endarterectomy c Dr. Leotis Pain. Pt has a long history of lumbar DJD, s/p L5/S1 decompression discectomy x2, L5 nerve root injections. Most recent Lumbar MRI (11/10/18) revealin gof advanced formaminal stenosis of L3/4, L4/5, L5/S1. Pt is has had LLE neurological weakness and pain since 2015 and has been followed by neurosurgery.    How long can you sit comfortably? Not limited    How long can you stand comfortably? 3-5 minutes    How long can you walk comfortably? 10 minutes    Patient Stated Goals return to functional baseline status of Summer 2021.    Currently in Pain? No/denies                   Seated:    -Sit to stand 3x5 with one hand on bar    -Seated knee extension: 3# L 2x12, 4# R 2x12; L LE fatigues quickly and pt lacks eccentric control    Standing: in // bars  Forward marching, backwards ambulation 6x length of // bars. Cues for hand placement and reduction of trunk extension.    -standing lateral walking in parallel bars, 6x length of // bars, cueing for foot  placement   Step over and back orange hurdle 10x each LE, BUE support ; set of 5 reps each leg.   -Airex balance beam- self selected base of support 3 trials x 30 sec, pt intermittently holding hand rail in // bars   bosu ball: modified forward lunge with BUE support; 10x each LE          Pt educated throughout session about proper posture and technique with exercises. Improved exercise technique, movement at target joints, use of target muscles after min to mod verbal, visual, tactile cues                 PT Education - 09/10/20 1251    Education provided Yes    Education Details exercise technique, body mechanics,    Person(s) Educated Patient    Methods Explanation;Demonstration;Tactile cues;Verbal cues    Comprehension Verbalized understanding;Returned demonstration;Verbal cues required;Tactile cues required  PT Short Term Goals - 08/25/20 1500      PT SHORT TERM GOAL #1   Title After 4 weeks patient will be independent in HEP to progress strength, AMB, and balance.    Time 4    Period Weeks    Status New    Target Date 09/22/20      PT SHORT TERM GOAL #2   Title After 4 weeks pt will demonstrate improved 5xSTS hands free chair + airex foam in <20sec    Time 4    Period Weeks    Status New    Target Date 09/22/20             PT Long Term Goals - 08/27/20 1227      PT LONG TERM GOAL #1   Title After 8 weeks pt to demonstrate FOTO score >65 to demonstrate improved capacity to participate in ADL/IADL actiivtry.    Baseline 51 at evaluation.    Time 8    Period Weeks    Status New    Target Date 10/20/20      PT LONG TERM GOAL #2   Title After 8 weeks pt will demonstrate (LRAD) >777ft to demonstrate improved capacity to access the community.    Baseline 12/16: 120 ft    Time 8    Period Weeks    Status New    Target Date 10/20/20      PT LONG TERM GOAL #3   Title After 8 weeks pt to demonstrate improved 5xSTS from chair +  airex hands free <15 seconds.    Time 8    Period Weeks    Status New    Target Date 10/20/20      PT LONG TERM GOAL #4   Title After 12 weeks pt to demonstrate improved FOTO score >70, BBT>46/56    Time 12    Period Weeks    Status New      PT LONG TERM GOAL #5   Title After 12 weeks pt will demonstrate improved 6MWT>1271ft c LRAD to improve safety in community AMB.    Time 12    Period Weeks    Status New    Target Date 10/20/20                 Plan - 09/10/20 1649    Clinical Impression Statement Patient is challenged with prolonged muscle recruitment of LLE requiring cueing for breathing, awareness of fatigue for safety, and need for rest breaks. Patient verbalized and demonstrated understanding of importance of rest for safety. Control of LLE activation improved with breathing pattern control. . Patient will benefit from skilled physical therapy to reduce fall risk, improve strength and mobility, and return to PLOF.    Personal Factors and Comorbidities Age;Comorbidity 2;Past/Current Experience;Time since onset of injury/illness/exacerbation    Comorbidities COPD, History of Cancer, myasthenia gravis, chronic lumbar surgical history.    Examination-Activity Limitations Lift;Squat;Bend;Stairs;Stand;Transfers;Probation officer;Bathing;Hygiene/Grooming;Dressing;Toileting    Examination-Participation Restrictions Yard Work;Church;Cleaning;Driving    Stability/Clinical Decision Making Evolving/Moderate complexity    Rehab Potential Good    Clinical Impairments Affecting Rehab Potential PMH    PT Frequency 2x / week    PT Duration 12 weeks    PT Treatment/Interventions ADLs/Self Care Home Management;Electrical Stimulation;Moist Heat;Traction;Ultrasound;Gait training;Stair training;Functional mobility training;Therapeutic activities;Therapeutic exercise;Balance training;Neuromuscular re-education;Patient/family education;Manual techniques;Passive range of  motion;Dry needling;Joint Manipulations;Cryotherapy    PT Next Visit Plan balance, strength    PT Home Exercise Plan none at eval  Consulted and Agree with Plan of Care Patient           Patient will benefit from skilled therapeutic intervention in order to improve the following deficits and impairments:  Abnormal gait,Decreased balance,Decreased mobility,Decreased endurance,Difficulty walking,Hypomobility,Impaired sensation,Decreased range of motion,Impaired perceived functional ability,Decreased activity tolerance,Decreased coordination,Decreased strength,Impaired flexibility,Postural dysfunction,Pain  Visit Diagnosis: Muscle weakness (generalized)  Other lack of coordination  Cervical myelopathy (HCC)  Chronic left-sided low back pain with left-sided sciatica     Problem List Patient Active Problem List   Diagnosis Date Noted  . S/P cervical spinal fusion   . Leukocytosis   . Essential hypertension   . Anemia of chronic disease   . Postoperative pain   . Neuropathic pain   . Cervical myelopathy ([place]) 08/07/2020  . Leg weakness, bilateral 07/13/2020  . Myalgia 10/30/2019  . PAD (peripheral artery disease) ([place]) 06/06/2019  . CKD (chronic kidney disease) stage 3, GFR 30-59 ml/min (HCC) 04/29/2019  . B12 deficiency 01/23/2019  . Left arm numbness 02/28/2018  . Neck pain 02/28/2018  . Anemia 02/02/2017  . DDD (degenerative disc disease), cervical 02/02/2017  . Hypothyroid 02/02/2017  . MRSA (methicillin resistant staph aureus) culture positive 02/02/2017  . Nocturnal hypoxia 02/02/2017  . Senile purpura ([place]) 10/03/2016  . Essential hypertension, benign 09/16/2016  . Bilateral carotid artery disease ([place]) 09/16/2016  . 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/18/2016  . 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 History of prostate cancer 12/21/2015  . Left carpal tunnel syndrome 11/11/2015  . Kidney stone on left side 07/05/2015  . Myasthenia gravis ([place]) 05/26/2015   . Long-term use of high-risk medication 10/15/2014  . Persistent cough 09/10/2014  . Pure hypercholesterolemia 07/25/2014  . Spinal stenosis, lumbar region, with neurogenic claudication 02/24/2014  . Lumbosacral stenosis with neurogenic claudication ([place]) 02/24/2014  . COPD (chronic obstructive pulmonary disease) ([place]) 01/22/2014   Janna Arch, PT, DPT   09/10/2020, 4:56 PM  [place] MAIN [facility] SERVICES [address] [place], [place], [address] Phone: [phone]   Fax:  [phone]  Name: Francisco [name] MRN: [id number] Date of Birth: 1946/05/04

## 2020-09-14 ENCOUNTER — Telehealth (INDEPENDENT_AMBULATORY_CARE_PROVIDER_SITE_OTHER): Payer: Self-pay

## 2020-09-14 ENCOUNTER — Other Ambulatory Visit (INDEPENDENT_AMBULATORY_CARE_PROVIDER_SITE_OTHER): Payer: Self-pay | Admitting: Nurse Practitioner

## 2020-09-14 NOTE — Telephone Encounter (Signed)
Patient was called and given instructions regarding his carotid stent placement with a 6:45 am arrival time on 09/17/20 with Dr. Wyn Quaker at the [facility]. Covid testing on 09/15/20 between 8-1 pm at the [facility]. I attempted to contact the patient to let him know his updated arrival time to the [facility] of 7:45 instead of 6:45 am. The mail box is full and I was unable to make contact.

## 2020-09-15 ENCOUNTER — Other Ambulatory Visit
Admission: RE | Admit: 2020-09-15 | Discharge: 2020-09-15 | Disposition: A | Discharge status: Home / Self Care | Payer: Medicare Other | Source: Ambulatory Visit | Attending: Vascular Surgery | Admitting: Vascular Surgery

## 2020-09-15 ENCOUNTER — Ambulatory Visit: Payer: Medicare Other | Attending: Internal Medicine

## 2020-09-15 ENCOUNTER — Other Ambulatory Visit: Payer: Self-pay

## 2020-09-15 DIAGNOSIS — Z20822 Contact with and (suspected) exposure to covid-19: Secondary | ICD-10-CM | POA: Insufficient documentation

## 2020-09-15 DIAGNOSIS — R278 Other lack of coordination: Secondary | ICD-10-CM | POA: Insufficient documentation

## 2020-09-15 DIAGNOSIS — M5442 Lumbago with sciatica, left side: Secondary | ICD-10-CM | POA: Insufficient documentation

## 2020-09-15 DIAGNOSIS — G959 Disease of spinal cord, unspecified: Secondary | ICD-10-CM

## 2020-09-15 DIAGNOSIS — Z01812 Encounter for preprocedural laboratory examination: Secondary | ICD-10-CM | POA: Insufficient documentation

## 2020-09-15 DIAGNOSIS — R2681 Unsteadiness on feet: Secondary | ICD-10-CM

## 2020-09-15 DIAGNOSIS — G8929 Other chronic pain: Secondary | ICD-10-CM

## 2020-09-15 DIAGNOSIS — M6281 Muscle weakness (generalized): Secondary | ICD-10-CM

## 2020-09-15 DIAGNOSIS — M792 Neuralgia and neuritis, unspecified: Secondary | ICD-10-CM

## 2020-09-15 LAB — SARS CORONAVIRUS 2 (TAT 6-24 HRS): SARS Coronavirus 2: NEGATIVE

## 2020-09-15 NOTE — Therapy (Signed)
[name] [address] [place], [place], [address] Phone: [phone]   Fax:  [phone]  Physical Therapy Treatment  Patient Details  Name: Francisco [name] MRN: [id number] Date of Birth: 01-07-1946 Referring Provider (PT): Lauraine Rinne, PA-C   Encounter Date: 09/15/2020   PT End of Session - 09/15/20 1251    Visit Number 5    Number of Visits 25    Date for PT Re-Evaluation 11/17/20    Authorization Type UHC Medicare    Authorization Time Period 08/25/20-11/17/20    PT Start Time 0100    PT Stop Time 0145    PT Time Calculation (min) 45 min    Equipment Utilized During Treatment Gait belt;Cervical collar    Activity Tolerance Patient tolerated treatment well;No increased pain;Patient limited by fatigue    Behavior During Therapy [facility] for tasks assessed/performed           Past Medical History:  Diagnosis Date  . Arthritis    lower left hip  . Atypical angina ([place])   . Bilateral hand numbness    from back surgery  . Bronchitis, chronic ([place])   . Cancer [facility])    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; Merkel cell cancer, and Basal cell cancer (twice; 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  . Carotid stenosis   . CKD (chronic kidney disease) stage 3, GFR 30-59 ml/min (HCC)   . 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 DDD (degenerative disc disease), cervical   . Hypercholesterolemia   . Hypertension   . Hypothyroidism    pt takes Levothyroxine daily  . Lumbosacral spinal stenosis   . Myasthenia gravis, adult form ([place])   . PAD (peripheral artery disease) ([place])   . Shortness of breath    Lung MD- Dr Darlin Coco  . Sleep apnea    do not use CPAP every night    Past Surgical History:  Procedure Laterality Date  . ANTERIOR CERVICAL DECOMP/DISCECTOMY FUSION  07/18/2011   Procedure: ANTERIOR CERVICAL DECOMPRESSION/DISCECTOMY FUSION 2 LEVELS;  Surgeon: Cooper Render Pool;  Location: [facility];  Service:  Neurosurgery;  Laterality: N/A;  cervical five-six, cervical six-seven anterior cervical discectomy and fusion  . BACK SURGERY     in [place]     01/2020 Right, 04/2020 Left  . CARDIAC CATHETERIZATION     2005 at [facility], no stents  . CATARACT EXTRACTION W/PHACO Left 01/06/2020   Procedure: CATARACT EXTRACTION PHACO AND INTRAOCULAR LENS PLACEMENT (IOC) ISTENT INJ LEFT 3.81  00:33.3;  Surgeon: Eulogio Bear, MD;  Location: [facility];  Service: Ophthalmology;  Laterality: Left;  . CATARACT EXTRACTION W/PHACO Right 02/03/2020   Procedure: CATARACT EXTRACTION PHACO AND INTRAOCULAR LENS PLACEMENT ([place]) RIGHT ISTENT INJ;  Surgeon: Eulogio Bear, MD;  Location: [facility];  Service: Ophthalmology;  Laterality: Right;  4.29 0:35.6  . COLONOSCOPY    . HERNIA REPAIR Left    inguinal hernia repair in 1985  . LUMBAR LAMINECTOMY/DECOMPRESSION MICRODISCECTOMY Left 02/24/2014   Procedure: LUMBAR LAMINECTOMY/DECOMPRESSION MICRODISCECTOMY LUMBAR THREE-FOUR, FOUR-FIVE, LEFT FIVE-SACRAL ONE ;  Surgeon: Charlie Pitter, MD;  Location: [facility];  Service: Neurosurgery;  Laterality: Left;  LUMBAR LAMINECTOMY/DECOMPRESSION MICRODISCECTOMY LUMBAR THREE-FOUR, FOUR-FIVE, LEFT FIVE-SACRAL ONE   . POSTERIOR CERVICAL FUSION/FORAMINOTOMY N/A 08/07/2020   Procedure: C3-6 POSTERIOR FUSION WITH DECOMPRESSION;  Surgeon: Meade Maw, MD;  Location: [facility];  Service: Neurosurgery;  Laterality: N/A;  .  PROSTATECTOMY  8/14   [facility] Dr Mare Ferrari     There were no vitals filed for this visit.   Subjective Assessment - 09/15/20 1348    Subjective Patient reports he is slowly getting better.  The patient states he needs to use neck brace intermittently for next 6 weeks.    Francisco [name] is a 28yoM referred to OPPT neuro s/p myelopathy s/p cervical decompression and fusion. Pt sustained several months of general decline in mobility  prior to surgical decompression. Pt went to inpatient rehab after hospitalization. [facility].  Pt underwent C3-6 decompression, fusion on 11/26. PMH: myasthenia gravis, 2012 ACDF 5/6, 6/7; OSA, hypothyroidism, HTN, COPD, multiple back surgeries, 2021 carpal tunnel release, referred to OPOT, but only seen for evaluation. 8/10 OPPT evaluation for LSS c radiating pain into the left hip, only completed evaluation session. September - November progressive decline in leg strength and tolerance to upright activity. Also noted a decline in tool manipulation for ADL (pen and meal utensil). Seen by OT and PT as an outpatient for other potential etiology (Carpal tunnel syndrome, and lumbar spinal stenosis), but eventually identified to be related to cervical myelopathy. Several months prior has no limitations in mobility or function. Pt scheduled for Jan 6th: Carotid endarterectomy c Dr. Leotis Pain. Pt has a long history of lumbar DJD, s/p L5/S1 decompression discectomy x2, L5 nerve root injections. Most recent Lumbar MRI (11/10/18) revealin gof advanced formaminal stenosis of L3/4, L4/5, L5/S1. Pt is has had LLE neurological weakness and pain since 2015 and has been followed by neurosurgery.    How long can you sit comfortably? Not limited    How long can you stand comfortably? 3-5 minutes    How long can you walk comfortably? 10 minutes    Patient Stated Goals return to functional baseline status of Summer 2021.             Seated:    -Sit to stand 3x5 with one hand on bar    -Seated knee extension: 3# L 2x12, 4# R 2x12; L LE fatigues quickly and pt lacks eccentric control    Standing: in // bars   Forward marching, backwards ambulation 3x length of // bars. Cues for hand and left foot placement.     -standing lateral walking in parallel bars, 3x length of // bars, cueing for foot placement- attempted larger steps   -Airex self selected base of support 3 trials x 30 sec, pt  intermittently holding hand rail in // bars, cueing to equal WB bilateral LE   -Airex beam side stepping, smaller steps    bosu ball: modified forward lunge with BUE support; 10x each LE                              PT Education - 09/15/20 1349    Education provided Yes    Education Details posture with exercises    Person(s) Educated Patient    Methods Explanation    Comprehension Verbalized understanding            PT Short Term Goals - 08/25/20 1500      PT SHORT TERM GOAL #1   Title After 4 weeks patient will be independent in HEP to progress strength, AMB, and balance.    Time 4    Period Weeks    Status New    Target Date 09/22/20  PT SHORT TERM GOAL #2   Title After 4 weeks pt will demonstrate improved 5xSTS hands free chair + airex foam in <20sec    Time 4    Period Weeks    Status New    Target Date 09/22/20             PT Long Term Goals - 08/27/20 1227      PT LONG TERM GOAL #1   Title After 8 weeks pt to demonstrate FOTO score >65 to demonstrate improved capacity to participate in ADL/IADL actiivtry.    Baseline 51 at evaluation.    Time 8    Period Weeks    Status New    Target Date 10/20/20      PT LONG TERM GOAL #2   Title After 8 weeks pt will demonstrate (LRAD) >757ft to demonstrate improved capacity to access the community.    Baseline 12/16: 120 ft    Time 8    Period Weeks    Status New    Target Date 10/20/20      PT LONG TERM GOAL #3   Title After 8 weeks pt to demonstrate improved 5xSTS from chair + airex hands free <15 seconds.    Time 8    Period Weeks    Status New    Target Date 10/20/20      PT LONG TERM GOAL #4   Title After 12 weeks pt to demonstrate improved FOTO score >70, BBT>46/56    Time 12    Period Weeks    Status New      PT LONG TERM GOAL #5   Title After 12 weeks pt will demonstrate improved 6MWT>1267ft c LRAD to improve safety in community AMB.    Time 12    Period Weeks     Status New    Target Date 10/20/20                 Plan - 09/15/20 1351    Clinical Impression Statement Patient challenged with program with cueing required for posture and exercise technique.  The patient required cueing for equal WB due to leaning towards right side.  The patient demonstrated fatigue with exercisess with visible buckling of bilateral knees.  Patient continues to benefit from additional skilled PT services to improve WB tolerances for improved quality of life and decreased fall risk.    Personal Factors and Comorbidities Age;Comorbidity 2;Past/Current Experience;Time since onset of injury/illness/exacerbation    Comorbidities COPD, History of Cancer, myasthenia gravis, chronic lumbar surgical history.    Examination-Activity Limitations Lift;Squat;Bend;Stairs;Stand;Transfers;Probation officer;Bathing;Hygiene/Grooming;Dressing;Toileting    Examination-Participation Restrictions Yard Work;Church;Cleaning;Driving    Stability/Clinical Decision Making Evolving/Moderate complexity    Rehab Potential Good    Clinical Impairments Affecting Rehab Potential PMH    PT Frequency 2x / week    PT Duration 12 weeks    PT Treatment/Interventions ADLs/Self Care Home Management;Electrical Stimulation;Moist Heat;Traction;Ultrasound;Gait training;Stair training;Functional mobility training;Therapeutic activities;Therapeutic exercise;Balance training;Neuromuscular re-education;Patient/family education;Manual techniques;Passive range of motion;Dry needling;Joint Manipulations;Cryotherapy    PT Next Visit Plan balance, strength    PT Home Exercise Plan none at eval    Consulted and Agree with Plan of Care Patient           Patient will benefit from skilled therapeutic intervention in order to improve the following deficits and impairments:  Abnormal gait,Decreased balance,Decreased mobility,Decreased endurance,Difficulty walking,Hypomobility,Impaired  sensation,Decreased range of motion,Impaired perceived functional ability,Decreased activity tolerance,Decreased coordination,Decreased strength,Impaired flexibility,Postural dysfunction,Pain  Visit Diagnosis: Muscle weakness (generalized)  Cervical myelopathy (HCC)  Chronic left-sided low back pain with left-sided sciatica  Unsteadiness on feet  Neuropathic pain     Problem List Patient Active Problem List   Diagnosis Date Noted  . S/P cervical spinal fusion   . Leukocytosis   . Essential hypertension   . Anemia of chronic disease   . Postoperative pain   . Neuropathic pain   . Cervical myelopathy ([place]) 08/07/2020  . Leg weakness, bilateral 07/13/2020  . Myalgia 10/30/2019  . PAD (peripheral artery disease) ([place]) 06/06/2019  . CKD (chronic kidney disease) stage 3, GFR 30-59 ml/min (HCC) 04/29/2019  . B12 deficiency 01/23/2019  . Left arm numbness 02/28/2018  . Neck pain 02/28/2018  . Anemia 02/02/2017  . DDD (degenerative disc disease), cervical 02/02/2017  . Hypothyroid 02/02/2017  . MRSA (methicillin resistant staph aureus) culture positive 02/02/2017  . Nocturnal hypoxia 02/02/2017  . Senile purpura ([place]) 10/03/2016  . Essential hypertension, benign 09/16/2016  . Bilateral carotid artery disease ([place]) 09/16/2016  . 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/18/2016  . 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 History of prostate cancer 12/21/2015  . Left carpal tunnel syndrome 11/11/2015  . Kidney stone on left side 07/05/2015  . Myasthenia gravis ([place]) 05/26/2015  . Long-term use of high-risk medication 10/15/2014  . Persistent cough 09/10/2014  . Pure hypercholesterolemia 07/25/2014  . Spinal stenosis, lumbar region, with neurogenic claudication 02/24/2014  . Lumbosacral stenosis with neurogenic claudication ([place]) 02/24/2014  . COPD (chronic obstructive pulmonary disease) ([place]) 01/22/2014    Hal Morales PT, DPT 09/15/2020, 2:56 PM  [place] [facility] [address] [place], [place], [address] Phone: [phone]   Fax:  [phone]  Name: OISIN BASSIL MRN: [id number] Date of Birth: 07-27-1946

## 2020-09-16 NOTE — Telephone Encounter (Signed)
I attempted to return the call to the patient regarding his message about stopping his Aspirin I was unable to leave a message. Patient's wife called back and was given the information that the patient can take all meds with small sips of water and to be NPO for 8 hours prior to the procedure.

## 2020-09-17 ENCOUNTER — Encounter: Payer: Self-pay | Admitting: Vascular Surgery

## 2020-09-17 ENCOUNTER — Other Ambulatory Visit: Payer: Self-pay

## 2020-09-17 ENCOUNTER — Encounter
Admission: AD | Disposition: A | Discharge status: Home / Self Care | Payer: Self-pay | Source: Home / Self Care | Attending: Vascular Surgery

## 2020-09-17 ENCOUNTER — Inpatient Hospital Stay
Admission: AD | Admit: 2020-09-17 | Discharge: 2020-09-19 | DRG: 035 | Disposition: A | Discharge status: Home / Self Care | Payer: Medicare Other | Attending: Vascular Surgery | Admitting: Vascular Surgery

## 2020-09-17 DIAGNOSIS — Z20822 Contact with and (suspected) exposure to covid-19: Secondary | ICD-10-CM | POA: Diagnosis present

## 2020-09-17 DIAGNOSIS — E039 Hypothyroidism, unspecified: Secondary | ICD-10-CM | POA: Diagnosis present

## 2020-09-17 DIAGNOSIS — Z8249 Family history of ischemic heart disease and other diseases of the circulatory system: Secondary | ICD-10-CM

## 2020-09-17 DIAGNOSIS — J449 Chronic obstructive pulmonary disease, unspecified: Secondary | ICD-10-CM | POA: Diagnosis present

## 2020-09-17 DIAGNOSIS — Z85828 Personal history of other malignant neoplasm of skin: Secondary | ICD-10-CM | POA: Diagnosis not present

## 2020-09-17 DIAGNOSIS — I6529 Occlusion and stenosis of unspecified carotid artery: Secondary | ICD-10-CM | POA: Diagnosis not present

## 2020-09-17 DIAGNOSIS — D649 Anemia, unspecified: Secondary | ICD-10-CM | POA: Diagnosis present

## 2020-09-17 DIAGNOSIS — Z9079 Acquired absence of other genital organ(s): Secondary | ICD-10-CM | POA: Diagnosis not present

## 2020-09-17 DIAGNOSIS — Z823 Family history of stroke: Secondary | ICD-10-CM

## 2020-09-17 DIAGNOSIS — R001 Bradycardia, unspecified: Secondary | ICD-10-CM | POA: Diagnosis not present

## 2020-09-17 DIAGNOSIS — E7800 Pure hypercholesterolemia, unspecified: Secondary | ICD-10-CM | POA: Diagnosis present

## 2020-09-17 DIAGNOSIS — Z8546 Personal history of malignant neoplasm of prostate: Secondary | ICD-10-CM

## 2020-09-17 DIAGNOSIS — N183 Chronic kidney disease, stage 3 unspecified: Secondary | ICD-10-CM | POA: Diagnosis present

## 2020-09-17 DIAGNOSIS — I9581 Postprocedural hypotension: Secondary | ICD-10-CM | POA: Diagnosis not present

## 2020-09-17 DIAGNOSIS — Z87891 Personal history of nicotine dependence: Secondary | ICD-10-CM | POA: Diagnosis not present

## 2020-09-17 DIAGNOSIS — Z885 Allergy status to narcotic agent status: Secondary | ICD-10-CM | POA: Diagnosis not present

## 2020-09-17 DIAGNOSIS — E785 Hyperlipidemia, unspecified: Secondary | ICD-10-CM | POA: Diagnosis present

## 2020-09-17 DIAGNOSIS — Z7952 Long term (current) use of systemic steroids: Secondary | ICD-10-CM

## 2020-09-17 DIAGNOSIS — G629 Polyneuropathy, unspecified: Secondary | ICD-10-CM | POA: Diagnosis present

## 2020-09-17 DIAGNOSIS — I6521 Occlusion and stenosis of right carotid artery: Principal | ICD-10-CM | POA: Diagnosis present

## 2020-09-17 DIAGNOSIS — Z981 Arthrodesis status: Secondary | ICD-10-CM | POA: Diagnosis not present

## 2020-09-17 DIAGNOSIS — I97191 Other postprocedural cardiac functional disturbances following other surgery: Secondary | ICD-10-CM | POA: Diagnosis not present

## 2020-09-17 DIAGNOSIS — Z79890 Hormone replacement therapy (postmenopausal): Secondary | ICD-10-CM

## 2020-09-17 DIAGNOSIS — M5030 Other cervical disc degeneration, unspecified cervical region: Secondary | ICD-10-CM | POA: Diagnosis present

## 2020-09-17 DIAGNOSIS — I129 Hypertensive chronic kidney disease with stage 1 through stage 4 chronic kidney disease, or unspecified chronic kidney disease: Secondary | ICD-10-CM | POA: Diagnosis present

## 2020-09-17 DIAGNOSIS — M4802 Spinal stenosis, cervical region: Secondary | ICD-10-CM | POA: Diagnosis present

## 2020-09-17 DIAGNOSIS — I4891 Unspecified atrial fibrillation: Secondary | ICD-10-CM | POA: Diagnosis not present

## 2020-09-17 DIAGNOSIS — Z85821 Personal history of Merkel cell carcinoma: Secondary | ICD-10-CM

## 2020-09-17 DIAGNOSIS — Z79899 Other long term (current) drug therapy: Secondary | ICD-10-CM

## 2020-09-17 DIAGNOSIS — G7000 Myasthenia gravis without (acute) exacerbation: Secondary | ICD-10-CM | POA: Diagnosis present

## 2020-09-17 HISTORY — PX: CAROTID PTA/STENT INTERVENTION: CATH118231

## 2020-09-17 LAB — CREATININE, SERUM
Creatinine, Ser: 1.67 mg/dL — ABNORMAL HIGH (ref 0.61–1.24)
GFR, Estimated: 42 mL/min — ABNORMAL LOW (ref >60)

## 2020-09-17 LAB — BUN: BUN: 45 mg/dL — ABNORMAL HIGH (ref 8–23)

## 2020-09-17 LAB — POCT ACTIVATED CLOTTING TIME: Activated Clotting Time: 214 seconds

## 2020-09-17 SURGERY — CAROTID PTA/STENT INTERVENTION
Anesthesia: Moderate Sedation

## 2020-09-17 MED ORDER — ACETAMINOPHEN 325 MG RE SUPP
325.0000 mg | RECTAL | Status: DC | PRN
  Filled 2020-09-17: qty 2

## 2020-09-17 MED ORDER — HYDROCODONE-ACETAMINOPHEN 5-325 MG PO TABS: 1.0000 | ORAL_TABLET | Freq: Two times a day (BID) | ORAL | Status: DC | PRN

## 2020-09-17 MED ORDER — CEFAZOLIN SODIUM-DEXTROSE 2-4 GM/100ML-% IV SOLN
INTRAVENOUS | Status: AC
  Administered 2020-09-17: 2 g via INTRAVENOUS
  Filled 2020-09-17: qty 100

## 2020-09-17 MED ORDER — HYDROCODONE-ACETAMINOPHEN 5-325 MG PO TABS
1.0000 | ORAL_TABLET | Freq: Four times a day (QID) | ORAL | Status: DC | PRN
  Administered 2020-09-18 – 2020-09-19 (×2): 1 via ORAL
  Administered 2020-09-19: 2 via ORAL
  Filled 2020-09-17: qty 1
  Filled 2020-09-17: qty 2
  Filled 2020-09-17: qty 1

## 2020-09-17 MED ORDER — MAGNESIUM SULFATE 2 GM/50ML IV SOLN
2.0000 g | Freq: Every day | INTRAVENOUS | Status: DC | PRN
  Filled 2020-09-17: qty 50

## 2020-09-17 MED ORDER — METOPROLOL TARTRATE 5 MG/5ML IV SOLN: 2.0000 mg | INTRAVENOUS | Status: DC | PRN

## 2020-09-17 MED ORDER — MIDAZOLAM HCL 5 MG/5ML IJ SOLN
INTRAMUSCULAR | Status: AC
  Filled 2020-09-17: qty 5

## 2020-09-17 MED ORDER — METHYLPREDNISOLONE SODIUM SUCC 125 MG IJ SOLR: 125.0000 mg | Freq: Once | INTRAMUSCULAR | Status: DC | PRN

## 2020-09-17 MED ORDER — HEPARIN SODIUM (PORCINE) 10000 UNIT/ML IJ SOLN
INTRAMUSCULAR | Status: AC
  Filled 2020-09-17: qty 1

## 2020-09-17 MED ORDER — MIDAZOLAM HCL 2 MG/ML PO SYRP: 8.0000 mg | ORAL_SOLUTION | Freq: Once | ORAL | Status: DC | PRN

## 2020-09-17 MED ORDER — ATROPINE SULFATE 1 MG/ML IJ SOLN
INTRAMUSCULAR | Status: DC | PRN
  Administered 2020-09-17 (×2): .5 mg via INTRAVENOUS

## 2020-09-17 MED ORDER — FENTANYL CITRATE (PF) 100 MCG/2ML IJ SOLN
INTRAMUSCULAR | Status: AC
  Filled 2020-09-17: qty 2

## 2020-09-17 MED ORDER — SODIUM CHLORIDE 0.9 % IV SOLN: 500.0000 mL | Freq: Once | INTRAVENOUS | Status: DC | PRN

## 2020-09-17 MED ORDER — HYDROCODONE-ACETAMINOPHEN 5-325 MG PO TABS
ORAL_TABLET | ORAL | Status: AC
  Administered 2020-09-17: 2 via ORAL
  Filled 2020-09-17: qty 2

## 2020-09-17 MED ORDER — IODIXANOL 320 MG/ML IV SOLN
INTRAVENOUS | Status: DC | PRN
  Administered 2020-09-17: 70 mL

## 2020-09-17 MED ORDER — MORPHINE SULFATE (PF) 2 MG/ML IV SOLN
INTRAVENOUS | Status: AC
  Administered 2020-09-17: 2 mg via INTRAVENOUS
  Filled 2020-09-17: qty 1

## 2020-09-17 MED ORDER — DIPHENHYDRAMINE HCL 50 MG/ML IJ SOLN: 50.0000 mg | Freq: Once | INTRAMUSCULAR | Status: DC | PRN

## 2020-09-17 MED ORDER — DOPAMINE-DEXTROSE 3.2-5 MG/ML-% IV SOLN: 3.0000 ug/kg/min | INTRAVENOUS | Status: DC

## 2020-09-17 MED ORDER — ALUM & MAG HYDROXIDE-SIMETH 200-200-20 MG/5ML PO SUSP: 15.0000 mL | ORAL | Status: DC | PRN

## 2020-09-17 MED ORDER — POLYSACCHARIDE IRON COMPLEX 150 MG PO CAPS
150.0000 mg | ORAL_CAPSULE | Freq: Two times a day (BID) | ORAL | Status: DC
  Administered 2020-09-17 – 2020-09-19 (×4): 150 mg via ORAL
  Filled 2020-09-17 (×7): qty 1

## 2020-09-17 MED ORDER — HEPARIN SODIUM (PORCINE) 1000 UNIT/ML IJ SOLN
INTRAMUSCULAR | Status: AC
  Filled 2020-09-17: qty 1

## 2020-09-17 MED ORDER — HYDROCODONE-ACETAMINOPHEN 5-325 MG PO TABS: 2.0000 | ORAL_TABLET | Freq: Once | ORAL | Status: AC

## 2020-09-17 MED ORDER — FENTANYL CITRATE (PF) 100 MCG/2ML IJ SOLN
INTRAMUSCULAR | Status: DC | PRN
  Administered 2020-09-17: 50 ug via INTRAVENOUS

## 2020-09-17 MED ORDER — AZATHIOPRINE 50 MG PO TABS
150.0000 mg | ORAL_TABLET | Freq: Every day | ORAL | Status: DC
  Administered 2020-09-18 – 2020-09-19 (×2): 150 mg via ORAL
  Filled 2020-09-17 (×3): qty 3

## 2020-09-17 MED ORDER — DOPAMINE-DEXTROSE 3.2-5 MG/ML-% IV SOLN
INTRAVENOUS | Status: AC
  Administered 2020-09-17: 3 ug/kg/min via INTRAVENOUS
  Filled 2020-09-17: qty 250

## 2020-09-17 MED ORDER — ASPIRIN EC 81 MG PO TBEC
81.0000 mg | DELAYED_RELEASE_TABLET | Freq: Every day | ORAL | Status: DC
  Administered 2020-09-18 – 2020-09-19 (×2): 81 mg via ORAL
  Filled 2020-09-17 (×3): qty 1

## 2020-09-17 MED ORDER — MORPHINE SULFATE (PF) 2 MG/ML IV SOLN: 2.0000 mg | Freq: Once | INTRAVENOUS | Status: AC

## 2020-09-17 MED ORDER — ONDANSETRON HCL 4 MG/2ML IJ SOLN
INTRAMUSCULAR | Status: AC
  Administered 2020-09-17: 4 mg via INTRAVENOUS
  Filled 2020-09-17: qty 2

## 2020-09-17 MED ORDER — FUROSEMIDE 20 MG PO TABS
20.0000 mg | ORAL_TABLET | Freq: Two times a day (BID) | ORAL | Status: DC | PRN
Start: 2020-09-17 — End: 2020-09-19
  Filled 2020-09-17: qty 1

## 2020-09-17 MED ORDER — LOSARTAN POTASSIUM 50 MG PO TABS
50.0000 mg | ORAL_TABLET | Freq: Two times a day (BID) | ORAL | Status: DC
Start: 2020-09-17 — End: 2020-09-19
  Administered 2020-09-18 – 2020-09-19 (×2): 50 mg via ORAL
  Filled 2020-09-17 (×5): qty 1

## 2020-09-17 MED ORDER — OXYBUTYNIN CHLORIDE 5 MG PO TABS
5.0000 mg | ORAL_TABLET | Freq: Three times a day (TID) | ORAL | Status: DC
  Administered 2020-09-18 – 2020-09-19 (×3): 5 mg via ORAL
  Filled 2020-09-17 (×5): qty 1

## 2020-09-17 MED ORDER — MIDAZOLAM HCL 2 MG/2ML IJ SOLN
INTRAMUSCULAR | Status: DC | PRN
  Administered 2020-09-17: 2 mg via INTRAVENOUS

## 2020-09-17 MED ORDER — PANTOPRAZOLE SODIUM 40 MG PO TBEC
40.0000 mg | DELAYED_RELEASE_TABLET | Freq: Two times a day (BID) | ORAL | Status: DC
  Administered 2020-09-17 – 2020-09-19 (×4): 40 mg via ORAL
  Filled 2020-09-17 (×6): qty 1

## 2020-09-17 MED ORDER — HYDRALAZINE HCL 20 MG/ML IJ SOLN: 5.0000 mg | INTRAMUSCULAR | Status: DC | PRN

## 2020-09-17 MED ORDER — SODIUM CHLORIDE 0.9 % IV BOLUS
1000.0000 mL | Freq: Once | INTRAVENOUS | Status: AC
  Administered 2020-09-17: 1000 mL via INTRAVENOUS

## 2020-09-17 MED ORDER — HYDROCODONE-ACETAMINOPHEN 5-325 MG PO TABS
ORAL_TABLET | ORAL | Status: AC
  Administered 2020-09-17: 1 via ORAL
  Filled 2020-09-17: qty 1

## 2020-09-17 MED ORDER — PHENOL 1.4 % MT LIQD
1.0000 | OROMUCOSAL | Status: DC | PRN
  Filled 2020-09-17: qty 177

## 2020-09-17 MED ORDER — HYDROCODONE-ACETAMINOPHEN 5-325 MG PO TABS
1.0000 | ORAL_TABLET | Freq: Two times a day (BID) | ORAL | Status: DC | PRN
Start: 2020-09-17 — End: 2020-09-17

## 2020-09-17 MED ORDER — ROPINIROLE HCL 1 MG PO TABS
4.0000 mg | ORAL_TABLET | Freq: Every day | ORAL | Status: DC
  Administered 2020-09-17 – 2020-09-18 (×2): 4 mg via ORAL
  Filled 2020-09-17 (×3): qty 4

## 2020-09-17 MED ORDER — LEVOTHYROXINE SODIUM 88 MCG PO TABS
88.0000 ug | ORAL_TABLET | Freq: Every day | ORAL | Status: DC
  Administered 2020-09-18 – 2020-09-19 (×2): 88 ug via ORAL
  Filled 2020-09-17 (×3): qty 1

## 2020-09-17 MED ORDER — TRAZODONE HCL 50 MG PO TABS
25.0000 mg | ORAL_TABLET | Freq: Every evening | ORAL | Status: DC | PRN
  Administered 2020-09-18: 25 mg via ORAL
  Filled 2020-09-17: qty 1
  Filled 2020-09-17: qty 0.5

## 2020-09-17 MED ORDER — SODIUM CHLORIDE 0.9 % IV SOLN
500.0000 mL | Freq: Once | INTRAVENOUS | Status: AC | PRN
  Administered 2020-09-17: 500 mL via INTRAVENOUS

## 2020-09-17 MED ORDER — FAMOTIDINE IN NACL 20-0.9 MG/50ML-% IV SOLN
20.0000 mg | Freq: Two times a day (BID) | INTRAVENOUS | Status: DC
  Administered 2020-09-17 – 2020-09-18 (×4): 20 mg via INTRAVENOUS
  Filled 2020-09-17 (×7): qty 50

## 2020-09-17 MED ORDER — ONDANSETRON HCL 4 MG/2ML IJ SOLN: 4.0000 mg | Freq: Four times a day (QID) | INTRAMUSCULAR | Status: DC | PRN

## 2020-09-17 MED ORDER — PYRIDOSTIGMINE BROMIDE 60 MG PO TABS
60.0000 mg | ORAL_TABLET | Freq: Four times a day (QID) | ORAL | Status: DC
  Administered 2020-09-17 – 2020-09-19 (×8): 60 mg via ORAL
  Filled 2020-09-17 (×12): qty 1

## 2020-09-17 MED ORDER — GUAIFENESIN-DM 100-10 MG/5ML PO SYRP
15.0000 mL | ORAL_SOLUTION | ORAL | Status: DC | PRN
  Filled 2020-09-17: qty 15

## 2020-09-17 MED ORDER — OXYBUTYNIN CHLORIDE 5 MG PO TABS
ORAL_TABLET | ORAL | Status: AC
  Administered 2020-09-17: 5 mg via ORAL
  Filled 2020-09-17: qty 1

## 2020-09-17 MED ORDER — ACETAMINOPHEN 325 MG PO TABS: 325.0000 mg | ORAL_TABLET | ORAL | Status: DC | PRN

## 2020-09-17 MED ORDER — CLOPIDOGREL BISULFATE 75 MG PO TABS
75.0000 mg | ORAL_TABLET | Freq: Every day | ORAL | Status: DC
  Administered 2020-09-18 – 2020-09-19 (×2): 75 mg via ORAL
  Filled 2020-09-17 (×2): qty 1

## 2020-09-17 MED ORDER — SODIUM CHLORIDE 0.9 % IV BOLUS
500.0000 mL | Freq: Once | INTRAVENOUS | Status: AC
  Administered 2020-09-17: 500 mL via INTRAVENOUS

## 2020-09-17 MED ORDER — ATORVASTATIN CALCIUM 10 MG PO TABS
10.0000 mg | ORAL_TABLET | Freq: Every day | ORAL | Status: DC
  Administered 2020-09-17 – 2020-09-19 (×3): 10 mg via ORAL
  Filled 2020-09-17 (×3): qty 1

## 2020-09-17 MED ORDER — SODIUM CHLORIDE 0.9 % IV SOLN: INTRAVENOUS | Status: DC

## 2020-09-17 MED ORDER — POTASSIUM CHLORIDE CRYS ER 20 MEQ PO TBCR: 20.0000 meq | EXTENDED_RELEASE_TABLET | Freq: Every day | ORAL | Status: DC | PRN

## 2020-09-17 MED ORDER — PREDNISONE 20 MG PO TABS
20.0000 mg | ORAL_TABLET | ORAL | Status: DC
  Administered 2020-09-19: 20 mg via ORAL
  Filled 2020-09-17: qty 1

## 2020-09-17 MED ORDER — PHENYLEPHRINE HCL (PRESSORS) 10 MG/ML IV SOLN
INTRAVENOUS | Status: AC
  Filled 2020-09-17: qty 1

## 2020-09-17 MED ORDER — FAMOTIDINE 20 MG PO TABS: 40.0000 mg | ORAL_TABLET | Freq: Once | ORAL | Status: DC | PRN

## 2020-09-17 MED ORDER — CEFAZOLIN SODIUM-DEXTROSE 2-4 GM/100ML-% IV SOLN
2.0000 g | Freq: Three times a day (TID) | INTRAVENOUS | Status: AC
Start: 2020-09-17 — End: 2020-09-18

## 2020-09-17 MED ORDER — DOPAMINE-DEXTROSE 3.2-5 MG/ML-% IV SOLN
INTRAVENOUS | Status: AC
  Filled 2020-09-17: qty 250

## 2020-09-17 MED ORDER — FENTANYL CITRATE (PF) 100 MCG/2ML IJ SOLN: 12.5000 ug | Freq: Once | INTRAMUSCULAR | Status: DC | PRN

## 2020-09-17 MED ORDER — LABETALOL HCL 5 MG/ML IV SOLN: 10.0000 mg | INTRAVENOUS | Status: DC | PRN

## 2020-09-17 MED ORDER — ATROPINE SULFATE 1 MG/10ML IJ SOSY
PREFILLED_SYRINGE | INTRAMUSCULAR | Status: AC
  Filled 2020-09-17: qty 10

## 2020-09-17 MED ORDER — HEPARIN SODIUM (PORCINE) 1000 UNIT/ML IJ SOLN
INTRAMUSCULAR | Status: DC | PRN
  Administered 2020-09-17: 3000 [IU] via INTRAVENOUS
  Administered 2020-09-17: 7000 [IU] via INTRAVENOUS

## 2020-09-17 MED ORDER — CEFAZOLIN SODIUM-DEXTROSE 2-4 GM/100ML-% IV SOLN: 2.0000 g | Freq: Once | INTRAVENOUS | Status: AC

## 2020-09-17 SURGICAL SUPPLY — 21 items
BALLN VIATRAC 5X30X135 (BALLOONS) ×2
BALLOON VIATRAC 5X30X135 (BALLOONS) ×1 IMPLANT
CATH ANGIO 5F 100CM .035 PIG (CATHETERS) ×2 IMPLANT
CATH BEACON 5 .035 100 H1 TIP (CATHETERS) ×2 IMPLANT
COVER PROBE U/S 5X48 (MISCELLANEOUS) ×2 IMPLANT
DEVICE EMBOSHIELD NAV6 4.0-7.0 (FILTER) ×2 IMPLANT
DEVICE SAFEGUARD 24CM (GAUZE/BANDAGES/DRESSINGS) ×2 IMPLANT
DEVICE STARCLOSE SE CLOSURE (Vascular Products) ×2 IMPLANT
DEVICE TORQUE .025-.038 (MISCELLANEOUS) ×2 IMPLANT
GLIDEWIRE ANGLED SS 035X260CM (WIRE) ×2 IMPLANT
KIT CAROTID MANIFOLD (MISCELLANEOUS) ×2 IMPLANT
KIT ENCORE 26 ADVANTAGE (KITS) ×2 IMPLANT
PACK ANGIOGRAPHY (CUSTOM PROCEDURE TRAY) ×2 IMPLANT
SHEATH BRITE TIP 5FRX11 (SHEATH) ×2 IMPLANT
SHEATH SHUTTLE SELECT 6F (SHEATH) ×2 IMPLANT
STENT XACT CAR 9-7X40X136 (Permanent Stent) ×2 IMPLANT
SYR MEDRAD MARK 7 150ML (SYRINGE) ×2 IMPLANT
TOWEL OR 17X26 4PK STRL BLUE (TOWEL DISPOSABLE) ×4 IMPLANT
TUBING CONTRAST HIGH PRESS 72 (TUBING) ×2 IMPLANT
WIRE G VAS 035X260 STIFF (WIRE) ×2 IMPLANT
WIRE GUIDERIGHT .035X150 (WIRE) ×2 IMPLANT

## 2020-09-17 NOTE — Op Note (Signed)
OPERATIVE NOTE DATE: 09/17/2020  PROCEDURE: 1.  Ultrasound guidance for vascular access right femoral artery 2.  Placement of a 9 mm proximal 7 mm distal 4 cm long Exact stent with the use of the NAV-6 embolic protection device in the right carotid artery  PRE-OPERATIVE DIAGNOSIS: 1. Right carotid artery stenosis. 2. Cervical spine disease precluding manipulation 3. Myasthenia gravis  POST-OPERATIVE DIAGNOSIS:  Same as above  SURGEON: Festus Barren, MD  ASSISTANT(S):  none  ANESTHESIA: local/MCS  ESTIMATED BLOOD LOSS:  50 cc  CONTRAST: 70 cc  FLUORO TIME: 7.6 minutes  MODERATE CONSCIOUS SEDATION TIME:  Approximately 49 minutes using 2 mg of Versed and 50 mcg of Fentanyl  FINDING(S): 1.   80% highly ulcerated right carotid artery stenosis  SPECIMEN(S):   none  INDICATIONS:   Patient is a 75 y.o. male who presents with high grade right carotid artery stenosis.  The patient has an immobile cervical spine s/p recent fusion and myasthenia gravis and carotid artery stenting was felt to be preferred to endarterectomy for that reason.  Risks and benefits were discussed and informed consent was obtained.   DESCRIPTION: After obtaining full informed written consent, the patient was brought back to the vascular suite and placed supine upon the table.  The patient received IV antibiotics prior to induction. Moderate conscious sedation was administered during a face to face encounter with the patient throughout the procedure with my supervision of the RN administering medicines and monitoring the patients vital signs and mental status throughout from the start of the procedure until the patient was taken to the recovery room.  After obtaining adequate anesthesia, the patient was prepped and draped in the standard fashion.   The right femoral artery was visualized with ultrasound and found to be widely patent. It was then accessed under direct ultrasound guidance without difficulty with a  Seldinger needle. A permanent image was recorded. A J-wire was placed and we then placed a 6 French sheath. The patient was then heparinized and a total of 10,000 units of intravenous heparin were given and an ACT was checked to confirm successful anticoagulation. A pigtail catheter was then placed into the ascending aorta. This showed a type 2B aortic arch. I then selectively cannulated the innominate artery and then the right carotid artery without difficulty with a headhunter catheter and advanced into the mid right common carotid artery.  Cervical and cerebral carotid angiography was then performed. There were no obvious intracranial filling defects with no cross filling. The carotid bifurcation demonstrated a tortuous carotid artery with a large ulceration and an approximately 80% stenosis at and just above the bifurcation.  I then advanced into the external carotid artery with a Glidewire and the headhunter catheter and then exchanged for the Amplatz Super Stiff wire. Over the Amplatz Super Stiff wire, a 6 [place] shuttle sheath was placed into the mid common carotid artery. I then used the NAV-6  Embolic protection device and crossed the lesion and parked this in the distal internal carotid artery at the base of the skull.  I then selected a 9 mm x 7 mm 4 cm long Exact stent. This was deployed across the lesion encompassing it in its entirety. A 5 mm x 3 cm length balloon was used to post dilate the stent. Only about a 20% residual stenosis was present after angioplasty. Completion angiogram showed normal intracranial filling without new defects. At this point I elected to terminate the procedure. The sheath was removed and StarClose  closure device was deployed in the right femoral artery with excellent hemostatic result. The patient was taken to the recovery room in stable condition having tolerated the procedure well.  COMPLICATIONS: none  CONDITION: stable  Francisco [name] 09/17/2020 9:54 AM   This note  was created with Dragon Medical transcription system. Any errors in dictation are purely unintentional.

## 2020-09-17 NOTE — Progress Notes (Signed)
   09/17/20 1200  Clinical Encounter Type  Visited With Patient and family together  Visit Type Initial  Referral From Chaplain  Consult/Referral To Chaplain  Chaplain saw Pt's wife in the hall talking on the telephone and she to be upset. As chaplain started talking to her, she began crying and chaplain rubbed her back and told her she get her time on the phone and she would come back. When chaplain came back, Pt's wife was gone and lady told chaplain that she was back seeing her husband. Chaplain went into cath lab and found her sitting at the bedside of her husband. Pt's wife was upset because Pt was in so much pain. Pt told chaplain the meds they gave him is helping with the pain. Pt is waiting to go to ICU. Chaplain prayed with them as they held hands. After the prayer,  PT said I am 74 and still working for Him (God). Chaplain hugged Pt's wife and will follow up tomorrow.

## 2020-09-17 NOTE — Progress Notes (Signed)
Pt pain 0/10 to left hip at this time after morphine

## 2020-09-17 NOTE — Progress Notes (Signed)
Pt. Med. With 2 Hydrocodone/tylenol 5-325 mg pills now for c/o 10/10 left hip pain (one time order). Pt. Repositioned to right lat. Side with pillows for comfort. Pt. Speech clear; AA&Ox3.

## 2020-09-17 NOTE — H&P (Signed)
[facility] Admission History & Physical  MRN : [id number]  Francisco [name] is a 75 y.o. (05/01/1946) male who presents with chief complaint of No chief complaint on file. Marland Kitchen  History of Present Illness: Patient presents today for planned carotid stent placement.  He was last seen about 2 to 3 months ago and at that time we had considered carotid endarterectomy.  He has a quite complicated situation and multiple issues precluded carotid endarterectomy.  The most prominent was his severe cervical spine disease which has gone on to have surgical therapy by Dr. Izora Ribas and he is doing well from this.  His neck mobility is extremely limited and it was felt that if we manipulated his neck before repair, we risk of neurologic injury.  He also has myasthenia gravis and the general anesthesia for carotid endarterectomy was felt to be very dangerous.  He is still having some arm and leg numbness and weakness.  He has not fully recovered from his cervical spine disease but the hope is that we will help that as well.  He complains of hip pain primarily today.  Current Facility-Administered Medications  Medication Dose Route Frequency Provider Last Rate Last Admin  . ceFAZolin (ANCEF) 2-4 GM/100ML-% IVPB           . 0.9 %  sodium chloride infusion   Intravenous Continuous Kris Hartmann, NP 75 mL/hr at 09/17/20 0749 New Bag at 09/17/20 0749  . atropine 1 MG/10ML injection           . ceFAZolin (ANCEF) IVPB 2g/100 mL premix  2 g Intravenous Once Eulogio Ditch E, NP      . diphenhydrAMINE (BENADRYL) injection 50 mg  50 mg Intravenous Once PRN Kris Hartmann, NP      . DOPamine (INTROPIN) 3.2-5 MG/ML-% infusion           . famotidine (PEPCID) tablet 40 mg  40 mg Oral Once PRN Kris Hartmann, NP      . fentaNYL (SUBLIMAZE) 100 MCG/2ML injection           . heparin 10000 UNIT/ML injection           . heparin sodium (porcine) 1000 UNIT/ML injection           . methylPREDNISolone  sodium succinate (SOLU-MEDROL) 125 mg/2 mL injection 125 mg  125 mg Intravenous Once PRN Eulogio Ditch E, NP      . midazolam (VERSED) 2 MG/ML syrup 8 mg  8 mg Oral Once PRN Kris Hartmann, NP      . midazolam (VERSED) 5 MG/5ML injection           . phenylephrine (NEO-SYNEPHRINE) 10 MG/ML injection             Past Medical History:  Diagnosis Date  . Arthritis    lower left hip  . Atypical angina ([place])   . Bilateral hand numbness    from back surgery  . Bronchitis, chronic ([place])   . 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 Carotid stenosis   . CKD (chronic kidney disease) stage 3, GFR 30-59 ml/min (HCC)   . 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 DDD (degenerative disc disease), cervical   . Hypercholesterolemia   . Hypertension   . Hypothyroidism    pt takes Levothyroxine daily  . Lumbosacral spinal stenosis   .  Myasthenia gravis, adult form (HCC)   . PAD (peripheral artery disease) (HCC)   . Shortness of breath    Lung MD- Dr Joetta Manners  . Sleep apnea    do not use CPAP every night    Past Surgical History:  Procedure Laterality Date  . ANTERIOR CERVICAL DECOMP/DISCECTOMY FUSION  07/18/2011   Procedure: ANTERIOR CERVICAL DECOMPRESSION/DISCECTOMY FUSION 2 LEVELS;  Surgeon: Kathaleen Maser Pool;  [facility];  Service: Neurosurgery;  Laterality: N/A;  cervical five-six, cervical six-seven anterior cervical discectomy and fusion  . BACK SURGERY     in 1985 [facility]  . BILATERAL CARPAL TUNNEL RELEASE     01/2020 Right, 04/2020 Left  . CARDIAC CATHETERIZATION     2005 at [facility], no stents  . CATARACT EXTRACTION W/PHACO Left 01/06/2020   Procedure: CATARACT EXTRACTION PHACO AND INTRAOCULAR LENS PLACEMENT (IOC) ISTENT INJ LEFT 3.81  00:33.3;  Surgeon: Nevada Crane, MD;  Location: [facility];  Service: Ophthalmology;  Laterality: Left;  . CATARACT EXTRACTION  W/PHACO Right 02/03/2020   Procedure: CATARACT EXTRACTION PHACO AND INTRAOCULAR LENS PLACEMENT (IOC) RIGHT ISTENT INJ;  Surgeon: Nevada Crane, MD;  Location: [facility];  Service: Ophthalmology;  Laterality: Right;  4.29 0:35.6  . COLONOSCOPY    . HERNIA REPAIR Left    inguinal hernia repair in 1985  . LUMBAR LAMINECTOMY/DECOMPRESSION MICRODISCECTOMY Left 02/24/2014   Procedure: LUMBAR LAMINECTOMY/DECOMPRESSION MICRODISCECTOMY LUMBAR THREE-FOUR, FOUR-FIVE, LEFT FIVE-SACRAL ONE ;  Surgeon: Temple Pacini, MD;  Location: [facility];  Service: Neurosurgery;  Laterality: Left;  LUMBAR LAMINECTOMY/DECOMPRESSION MICRODISCECTOMY LUMBAR THREE-FOUR, FOUR-FIVE, LEFT FIVE-SACRAL ONE   . POSTERIOR CERVICAL FUSION/FORAMINOTOMY N/A 08/07/2020   Procedure: C3-6 POSTERIOR FUSION WITH DECOMPRESSION;  Surgeon: Venetia Night, MD;  Location: [facility] OR[facility];  Service: Neurosurgery;  Laterality: N/A;  . PROSTATECTOMY  8/14   [facility] Dr Joycelyn Das      Social History   Tobacco Use  . Smoking status: Former Smoker    Packs/day: 1.00    Years: 20.00    Pack years: 20.00    Types: Cigarettes    Quit date: 09/12/2001    Years since quitting: 19.0  . Smokeless tobacco: Never Used  Vaping Use  . Vaping Use: Never used  Substance Use Topics  . Alcohol use: Yes    Alcohol/week: 3.0 standard drinks    Types: 3 Glasses of wine per week    Comment: 3 glasses a wine a week  . Drug use: No     Family History  Problem Relation Age of Onset  . Hypertension Mother   . Stroke Mother   . Stroke Father     Allergies  Allergen Reactions  . Azithromycin Other (See Comments)    Avoid due to myasthenia gravis  . Codeine Nausea And Vomiting    REVIEW OF SYSTEMS(Negative unless checked)  Constitutional: [] ???Weight loss[] ???Fever[] ???Chills Cardiac:[] ???Chest pain[] ???Chest pressure[] ???Palpitations [] ???Shortness of breath when laying flat [] ???Shortness of breath at rest  [] ???Shortness of breath with exertion. Vascular: [] ???Pain in legs with walking[] ???Pain in legsat rest[] ???Pain in legs when laying flat [] ???Claudication [] ???Pain in feet when walking [] ???Pain in feet at rest [] ???Pain in feet when laying flat [] ???History of DVT [] ???Phlebitis [] ???Swelling in legs [] ???Varicose veins [] ???Non-healing ulcers Pulmonary: [] ???Uses home oxygen [] ???Productive cough[] ???Hemoptysis [] ???Wheeze [x] ???COPD [] ???Asthma Neurologic: [] ???Dizziness [] ???Blackouts [] ???Seizures [] ???History of stroke [] ???History of TIA[] ???Aphasia [] ???Temporary blindness[] ???Dysphagia [] ???Weaknessor numbness in arms [] ???Weakness or numbnessin legs Musculoskeletal: [x] ???Arthritis [] ???Joint swelling [x] ???Joint pain [x] ???Low back pain Hematologic:[] ???Easy bruising[] ???Easy bleeding [] ???Hypercoagulable state [  x]???Anemic [] ???Hepatitis Gastrointestinal:[] ???Blood in stool[] ???Vomiting blood[] ???Gastroesophageal reflux/heartburn[] ???Difficulty swallowing. Genitourinary: [] ???Chronic kidney disease [] ???Difficulturination [] ???Frequenturination [] ???Burning with urination[] ???Blood in urine Skin: [] ???Rashes [] ???Ulcers [] ???Wounds Psychological: [] ???History of anxiety[] ???History of major depression.  Physical Examination  Vitals:   09/17/20 0733  BP: 125/69  Pulse: 64  Resp: 16  Temp: 97.9 F (36.6 C)  TempSrc: Oral  SpO2: 99%  Weight: 101.2 kg  Height: 5\' 10"  (1.778 m)   Body mass index is 32 kg/m. Gen: WD/WN, NAD Head: [place]/AT, No temporalis wasting.  Ear/Nose/Throat: Hearing grossly intact, nares w/o erythema or drainage, oropharynx w/o Erythema/Exudate,  Eyes: Conjunctiva clear, sclera non-icteric Neck: Trachea midline.  No JVD.  Cervical spine mobility is quite limited. Pulmonary:  Good air movement, respirations not labored, no use of accessory muscles.   Cardiac: RRR, normal S1, S2. Vascular:  Vessel Right Left  Radial Palpable Palpable           Musculoskeletal: Diffuse lower extremity weakness.  Walks with a walker.  No deformity or atrophy.  Trace lower extremity edema Neurologic: Sensation grossly intact in extremities.  Symmetrical.  Speech is fluent. Motor exam as listed above. Psychiatric: Judgment intact, Mood & affect appropriate for pt's clinical situation. Dermatologic: No rashes or ulcers noted.  No cellulitis or open wounds.      CBC Lab Results  Component Value Date   WBC 9.4 08/17/2020   HGB 9.8 (L) 08/17/2020   HCT 32.0 (L) 08/17/2020   MCV 107.4 (H) 08/17/2020   PLT 397 08/17/2020    BMET    Component Value Date/Time   NA 143 08/17/2020 0546   NA 141 03/04/2014 0450   K 3.9 08/17/2020 0546   K 3.9 03/04/2014 0450   CL 106 08/17/2020 0546   CL 104 03/04/2014 0450   CO2 26 08/17/2020 0546   CO2 28 03/04/2014 0450   GLUCOSE 108 (H) 08/17/2020 0546   GLUCOSE 100 (H) 03/04/2014 0450   BUN 45 (H) 09/17/2020 0721   BUN 25 (H) 03/04/2014 0450   CREATININE 1.67 (H) 09/17/2020 0721   CREATININE 0.99 03/04/2014 0450   CALCIUM 9.2 08/17/2020 0546   CALCIUM 9.0 03/04/2014 0450   GFRNONAA 42 (L) 09/17/2020 0721   GFRNONAA >60 03/04/2014 0450   GFRAA >60 03/04/2014 0450   Estimated Creatinine Clearance: 45.6 mL/min (A) (by C-G formula based on SCr of 1.67 mg/dL (H)).  COAG Lab Results  Component Value Date   INR 0.9 07/30/2020    Radiology No results found.   Assessment/Plan COPD (chronic obstructive pulmonary disease) Followed by Pulmonology  Essential hypertension, benign blood pressure control important in reducing the progression of atherosclerotic disease. On appropriate oral medications.  Pure hypercholesterolemia lipid control important in reducing the progression of atherosclerotic disease. Continue statin therapy   Bilateral carotid artery disease ([place]) I have independently  reviewed his CT angiogram of the neck.  I would respectfully disagree with the radiologist interpretation of a 50% stenosis.  This appear clearly more than that and I would gauge it at least in the 75 to 80% range.  I would agree with her assessment of a very large ulcerative lesion as there is quite a large ulcer.  My interpretation would correlate more with the carotid duplex which also suggested a high-grade stenosis. Initially, we had planned carotid endarterectomy but his cervical spine disease precluded this.  He has a quite high-grade lesion with a large ulceration and this clearly needs to be treated.  Carotid stenting will be planned if it is technically feasible  and appropriate at the time of angiogram today.  This was delayed till after his cervical spine surgery due to the need for Plavix pre and postoperatively.  Risks and benefits were discussed with the patient and he is agreeable to proceed.  Cervical spine disease Was quite severe and precluded carotid endarterectomy as we could not manipulate his neck at all.  This has now been addressed surgically and he seems to be doing well from this.  Myasthenia gravis After discussions with his neurologist, this would also bode against general anesthesia and make carotid endarterectomy much more risky.   Leotis Pain, MD  09/17/2020 8:55 AM

## 2020-09-17 NOTE — Progress Notes (Signed)
MD paged and made aware of AM labs; GFR 42, Cre 1.67. Orders also received for Morphine 2 mg IV x1 for pt. C/o severe left hip pain, 10/10 per pt.

## 2020-09-18 ENCOUNTER — Encounter: Payer: Self-pay | Admitting: Vascular Surgery

## 2020-09-18 ENCOUNTER — Other Ambulatory Visit: Payer: Self-pay

## 2020-09-18 LAB — BASIC METABOLIC PANEL
Anion gap: 7 (ref 5–15)
BUN: 49 mg/dL — ABNORMAL HIGH (ref 8–23)
CO2: 27 mmol/L (ref 22–32)
Calcium: 8.4 mg/dL — ABNORMAL LOW (ref 8.9–10.3)
Chloride: 112 mmol/L — ABNORMAL HIGH (ref 98–111)
Creatinine, Ser: 1.94 mg/dL — ABNORMAL HIGH (ref 0.61–1.24)
GFR, Estimated: 35 mL/min — ABNORMAL LOW (ref >60)
Glucose, Bld: 98 mg/dL (ref 70–99)
Potassium: 5 mmol/L (ref 3.5–5.1)
Sodium: 146 mmol/L — ABNORMAL HIGH (ref 135–145)

## 2020-09-18 LAB — CBC
HCT: 26.9 % — ABNORMAL LOW (ref 39.0–52.0)
Hemoglobin: 8.4 g/dL — ABNORMAL LOW (ref 13.0–17.0)
MCH: 34.1 pg — ABNORMAL HIGH (ref 26.0–34.0)
MCHC: 31.2 g/dL (ref 30.0–36.0)
MCV: 109.3 fL — ABNORMAL HIGH (ref 80.0–100.0)
Platelets: 225 10*3/uL (ref 150–400)
RBC: 2.46 MIL/uL — ABNORMAL LOW (ref 4.22–5.81)
RDW: 14.3 % (ref 11.5–15.5)
WBC: 6.9 10*3/uL (ref 4.0–10.5)
nRBC: 0 % (ref 0.0–0.2)

## 2020-09-18 MED ORDER — HYDROCODONE-ACETAMINOPHEN 5-325 MG PO TABS
ORAL_TABLET | ORAL | Status: AC
  Filled 2020-09-18: qty 2

## 2020-09-18 MED ORDER — OXYBUTYNIN CHLORIDE 5 MG PO TABS
ORAL_TABLET | ORAL | Status: AC
  Filled 2020-09-18: qty 1

## 2020-09-18 MED ORDER — CEFAZOLIN SODIUM-DEXTROSE 2-4 GM/100ML-% IV SOLN
INTRAVENOUS | Status: AC
  Administered 2020-09-18: 2 g via INTRAVENOUS
  Filled 2020-09-18: qty 100

## 2020-09-18 NOTE — Consult Note (Signed)
CARDIOLOGY CONSULT NOTE               Patient ID: Francisco [name] MRN: [id number] DOB/AGE: February 27, 1946 75 y.o.  Admit date: 09/17/2020 Referring Physician Schneir  Primary Physician Caryl Comes Primary Cardiologist Paraschos Reason for Consultation post-operative atrial fibrillation, bradycardia  HPI: 75 year old gentleman referred for evaluation of post-operative atrial fibrillation and bradycardia. The patient has a history of COPD, hyperlipidemia, CKD stage III, anemia, cervical spinal stenosis, and myasthenia gravis. The patient underwent right carotid stent placement on 09/17/2020. Per the nurses's report, the patient has remained bradycardic with rates in the 40-50s since surgery, and reportedly had a brief run of atrial fibrillation that was too brief to be captured in time with an ECG. Review of available ECG shows sinus bradycardia at a rate of 46 bpm with nonspecific T wave abnormalities without evidence of acute ischemia with PR interval 146 ms, QRS duration 84 ms, QTc 365 ms.  The patient has mostly remained in the bed postoperatively. He has difficultly ambulating at baseline to spinal issues and myasthenia gravis. The patient denies chest pain or shortness of breath. He denies palpitations, lightheadedness or dizziness. He actually would like to go home. Labs this morning show sodium 146, potassium 5.0, BUN 49, creatinine 1.94, hemoglobin 8.4, hematocrit 26.9. he is not on any AV nodal blocking agents.   Review of systems complete and found to be negative unless listed above     Past Medical History:  Diagnosis Date  . Arthritis    lower left hip  . Atypical angina ([place])   . Bilateral hand numbness    from back surgery  . Bronchitis, chronic ([place])   . Cancer [facility])    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; Merkel cell cancer, and Basal cell cancer (twice; 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  . Carotid stenosis   . CKD (chronic kidney disease) stage 3, GFR 30-59 ml/min (HCC)   . COPD (chronic  obstructive pulmonary disease) (HCC)    stage 2  . DDD (degenerative disc disease), cervical   . Hypercholesterolemia   . Hypertension   . Hypothyroidism    pt takes Levothyroxine daily  . Lumbosacral spinal stenosis   . Myasthenia gravis, adult form ([place])   . PAD (peripheral artery disease) ([place])   . Shortness of breath    Lung MD- Dr Darlin Coco  . Sleep apnea    do not use CPAP every night    Past Surgical History:  Procedure Laterality Date  . ANTERIOR CERVICAL DECOMP/DISCECTOMY FUSION  07/18/2011   Procedure: ANTERIOR CERVICAL DECOMPRESSION/DISCECTOMY FUSION 2 LEVELS;  Surgeon: Cooper Render Pool;  Location: [place] [facility];  Service: Neurosurgery;  Laterality: N/A;  cervical five-six, cervical six-seven anterior cervical discectomy and fusion  . BACK SURGERY     in [place]     01/2020 Right, 04/2020 Left  . CARDIAC CATHETERIZATION     2005 at [facility], no stents  . CAROTID PTA/STENT INTERVENTION N/A 09/17/2020   Procedure: CAROTID PTA/STENT INTERVENTION;  Surgeon: Algernon Huxley, MD;  Location: [place] [facility];  Service: Cardiovascular;  Laterality: N/A;  . CATARACT EXTRACTION W/PHACO Left 01/06/2020   Procedure: CATARACT EXTRACTION PHACO AND INTRAOCULAR LENS PLACEMENT (IOC) ISTENT INJ LEFT 3.81  00:33.3;  Surgeon: Eulogio Bear, MD;  Location: [place];  Service: Ophthalmology;  Laterality: Left;  . CATARACT EXTRACTION W/PHACO Right 02/03/2020   Procedure: CATARACT EXTRACTION PHACO AND INTRAOCULAR LENS PLACEMENT (IOC) RIGHT ISTENT INJ;  Surgeon: Eulogio Bear, MD;  Location: [place];  Service: Ophthalmology;  Laterality: Right;  4.29 0:35.6  . COLONOSCOPY    . HERNIA REPAIR Left    inguinal hernia repair in 1985  . LUMBAR LAMINECTOMY/DECOMPRESSION MICRODISCECTOMY Left 02/24/2014   Procedure: LUMBAR LAMINECTOMY/DECOMPRESSION MICRODISCECTOMY LUMBAR THREE-FOUR, FOUR-FIVE, LEFT FIVE-SACRAL ONE ;  Surgeon: Charlie Pitter, MD;  Location: [place] [facility];  Service: Neurosurgery;  Laterality: Left;  LUMBAR LAMINECTOMY/DECOMPRESSION MICRODISCECTOMY LUMBAR THREE-FOUR, FOUR-FIVE, LEFT FIVE-SACRAL ONE   . POSTERIOR CERVICAL FUSION/FORAMINOTOMY N/A 08/07/2020   Procedure: C3-6 POSTERIOR FUSION WITH DECOMPRESSION;  Surgeon: Meade Maw, MD;  Location: [facility]S;  Service: Neurosurgery;  Laterality: N/A;  . PROSTATECTOMY  8/14   [facility] Dr Mare Ferrari     Medications Prior to Admission  Medication Sig Dispense Refill Last Dose  . azaTHIOprine (IMURAN) 50 MG tablet Take 150 mg by mouth daily.    09/17/2020 at Unknown time  . furosemide (LASIX) 20 MG tablet Take 20 mg by mouth 2 (two) times daily as needed (swelling).    09/16/2020 at Unknown time  . HYDROcodone-acetaminophen (NORCO/VICODIN) 5-325 MG tablet Take 1 tablet by mouth 2 (two) times daily as needed for moderate pain.     Marland Kitchen levothyroxine (SYNTHROID, LEVOTHROID) 88 MCG tablet Take 88 mcg by mouth daily.   09/17/2020 at Unknown time  . losartan (COZAAR) 50 MG tablet Take 50 mg by mouth 2 (two) times daily.    09/17/2020 at Unknown time  . oxybutynin (DITROPAN) 5 MG tablet Take 5 mg by mouth 3 (three) times daily.   09/17/2020 at Unknown time  . pantoprazole (PROTONIX) 40 MG tablet Take 40 mg by mouth 2 (two) times daily before a meal.    09/17/2020 at Unknown time  . predniSONE (DELTASONE) 20 MG tablet Take 20 mg by mouth every other day.    09/17/2020 at Unknown time  . pyridostigmine (MESTINON) 60 MG tablet Take 60 mg by mouth in the morning and at bedtime.    09/17/2020 at Unknown time  . rOPINIRole (REQUIP) 4 MG tablet Take 4 mg by mouth at bedtime.    09/16/2020 at Unknown time  . traZODone (DESYREL) 50 MG tablet Take 0.5 tablets (25 mg total) by mouth at bedtime as needed for sleep. 15 tablet 0 09/16/2020 at Unknown time  . acetaminophen (TYLENOL) 325 MG tablet Take 2 tablets (650 mg total) by mouth every 6 (six) hours as needed for mild pain. (Patient not taking: Reported  on 09/17/2020)   Not Taking at Unknown time  . alum & mag hydroxide-simeth (MAALOX/MYLANTA) 200-200-20 MG/5ML suspension Take 30 mLs by mouth every 6 (six) hours as needed for indigestion. (Patient not taking: Reported on 09/17/2020) 355 mL 0 Not Taking at Unknown time  . iron polysaccharides (NIFEREX) 150 MG capsule Take 1 capsule (150 mg total) by mouth 2 (two) times daily before lunch and supper. 60 capsule 0 09/13/2020  . magnesium oxide (MAG-OX) 400 (241.3 Mg) MG tablet Take 1 tablet (400 mg total) by mouth daily. (Patient not taking: Reported on 09/17/2020) 30 tablet 0 Not Taking at Unknown time  . polyethylene glycol (MIRALAX / GLYCOLAX) 17 g packet Take 17 g by mouth daily. May increase to twice a day if no BM in 24 hours (Patient not taking: Reported on 09/17/2020) 60 each 0 Not Taking at Unknown time  . pregabalin (LYRICA) 100 MG capsule Take 1 capsule (100 mg total) by mouth 2 (two) times daily. (Patient not taking: Reported  on 09/17/2020) 60 capsule 0 Not Taking at Unknown time  . senna (SENOKOT) 8.6 MG TABS tablet Take 2 tablets (17.2 mg total) by mouth daily. (Patient not taking: Reported on 09/17/2020) 120 tablet 0 Not Taking at Unknown time  . vitamin B-12 (CYANOCOBALAMIN) 1000 MCG tablet Take 1,000 mcg by mouth daily. (Patient not taking: Reported on 09/17/2020)   Not Taking at Unknown time   Social History   Socioeconomic History  . Marital status: Married    Spouse name: [name]  . Number of children: Not on file  . Years of education: Not on file  . Highest education level: Not on file  Occupational History  . Not on file  Tobacco Use  . Smoking status: Former Smoker    Packs/day: 1.00    Years: 20.00    Pack years: 20.00    Types: Cigarettes    Quit date: 09/12/2001    Years since quitting: 19.0  . Smokeless tobacco: Never Used  Vaping Use  . Vaping Use: Never used  Substance and Sexual Activity  . Alcohol use: Yes    Alcohol/week: 3.0 standard drinks    Types: 3 Glasses of wine per  week    Comment: 3 glasses a wine a week  . Drug use: No  . Sexual activity: Yes  Other Topics Concern  . Not on file  Social History Narrative  . Not on file   Social Determinants of Health   Financial Resource Strain: Not on file  Food Insecurity: Not on file  Transportation Needs: Not on file  Physical Activity: Not on file  Stress: Not on file  Social Connections: Not on file  Intimate Partner Violence: Not on file    Family History  Problem Relation Age of Onset  . Hypertension Mother   . Stroke Mother   . Stroke Father       Review of systems complete and found to be negative unless listed above      PHYSICAL EXAM  General: Well developed, well nourished, in no acute distress, lying in bed HEENT:  Normocephalic and atramatic Neck:  No JVD.  Lungs: normal effort of breathing on supplemental oxygen via [place] Heart: bradycardia, normal rhythm. Normal S1 and S2 without gallops or murmurs.  Extremities: No clubbing, cyanosis or edema.   Neuro: Alert and oriented X 3. Psych:  Good affect, responds appropriately  Labs:   Lab Results  Component Value Date   WBC 6.9 09/18/2020   HGB 8.4 (L) 09/18/2020   HCT 26.9 (L) 09/18/2020   MCV 109.3 (H) 09/18/2020   PLT 225 09/18/2020    Recent Labs  Lab 09/18/20 0705  NA 146*  K 5.0  CL 112*  CO2 27  BUN 49*  CREATININE 1.94*  CALCIUM 8.4*  GLUCOSE 98   Lab Results  Component Value Date   CKTOTAL 106 03/02/2014   CKMB 2.0 03/02/2014   TROPONINI 0.03 03/02/2014   No results found for: CHOL No results found for: HDL No results found for: LDLCALC No results found for: TRIG No results found for: CHOLHDL No results found for: LDLDIRECT    Radiology: PERIPHERAL VASCULAR CATHETERIZATION  Result Date: 09/17/2020 See op note   EKG: sinus bradycardia, 46 bpm  ASSESSMENT AND PLAN:  1. Bradycardia, postoperative, with rates in the 40-50s, not on any AV nodal blocking agents, appears stable, asymptomatic. 2.  Atrial fibrillation, reported brief post-operative run with no rhythm strips to review. Currently in sinus rhythm 3. Hypertension, low  this morning 4. Anemia, hemoglobin 8.4, hematocrit 26.9.  5. Carotid stenosis, status post right carotid stent 09/17/2020, on aspirin and Plavix  Recommendations: 1. Recommend discharge today with 72-hr Holter monitor in place, and follow-up with Dr. Saralyn Pilar in approximately 10 days 2. Avoid AV nodal blocking agents 3. Continue DAPT; monitor anemia  Signed: Clabe Seal PA-C 09/18/2020, 12:10 PM Discussed with Dr. Saralyn Pilar who agrees with current plan.

## 2020-09-18 NOTE — Progress Notes (Signed)
   09/18/20 1100  Clinical Encounter Type  Visited With Patient and family together  Visit Type Follow-up  Referral From Chaplain  Consult/Referral To Chaplain  While rounding chaplain stopped by to check on Pt and his wife. When she arrived at the room, staff was room with Pt. Wife looked up and blew chaplain a kiss. Chaplain smiled and left.

## 2020-09-18 NOTE — Progress Notes (Signed)
[facility]  Daily Progress Note  Subjective: 09/18/19: 1.  Ultrasound guidance for vascular access right femoral artery 2.  Placement of a 9 mm proximal 7 mm distal 4 cm long Exact stent with the use of the NAV-6 embolic protection device in the right carotid artery  Patient without complaint this AM. Some continued left lower extremity pain.   Objective: Vitals:   09/18/20 0321 09/18/20 0422 09/18/20 0521 09/18/20 0800  BP: (!) 98/41 (!) 101/50 (!) 108/48 (!) 106/53  Pulse: (!) 57 (!) 54 (!) 46 98  Resp: 17 16 16  (!) 24  Temp: 97.9 F (36.6 C)  97.8 F (36.6 C) 97.7 F (36.5 C)  TempSrc:   Temporal Temporal  SpO2: 96% 94% 94% 97%  Weight:      Height:        Intake/Output Summary (Last 24 hours) at 09/18/2020 1138 Last data filed at 09/18/2020 0800 Gross per 24 hour  Intake 2825 ml  Output 950 ml  Net 1875 ml   Physical Exam: Alert and oriented x3, NAD Face: Symmetrical, tongue midline CV: Irregularly irregular Pulmonary: CTA Bilaterally Abdomen: Soft, Nontender, Nondistended Groin access site: Clean dry and intact Vascular: Warm distally toes Neurological: Intact   Laboratory: CBC    Component Value Date/Time   WBC 6.9 09/18/2020 0705   HGB 8.4 (L) 09/18/2020 0705   HGB 9.2 (L) 03/03/2014 0604   HCT 26.9 (L) 09/18/2020 0705   HCT 26.5 (L) 03/04/2014 0450   PLT 225 09/18/2020 0705   PLT 360 03/03/2014 0604   BMET    Component Value Date/Time   NA 146 (H) 09/18/2020 0705   NA 141 03/04/2014 0450   K 5.0 09/18/2020 0705   K 3.9 03/04/2014 0450   CL 112 (H) 09/18/2020 0705   CL 104 03/04/2014 0450   CO2 27 09/18/2020 0705   CO2 28 03/04/2014 0450   GLUCOSE 98 09/18/2020 0705   GLUCOSE 100 (H) 03/04/2014 0450   BUN 49 (H) 09/18/2020 0705   BUN 25 (H) 03/04/2014 0450   CREATININE 1.94 (H) 09/18/2020 0705   CREATININE 0.99 03/04/2014 0450   CALCIUM 8.4 (L) 09/18/2020 0705   CALCIUM 9.0 03/04/2014 0450   GFRNONAA 35 (L) 09/18/2020  0705   GFRNONAA >60 03/04/2014 0450   GFRAA >60 03/04/2014 0450   Assessment/Planning: Patient is a 75 year old male who presents with high grade right carotid artery stenosis s/p stenting - POD #1  1) patient with hypotension yesterday requiring approximately 3L bolus and IV fluid running at 100 cc.  Better pressures this a.m. however the patient is still bradycardic and possibly in new onset A. fib.  Patient is without chest pain or shortness of breath.  [place]ier spoke with [facility] will come see the patient today.  We will try to transfer him out of the ICU and to possibly telemetry today  2) patient with known chronic kidney disease continue to monitor BMP.  3) gram drop in hemoglobin however I think this is dilutional due to the amount of fluid patient was given yesterday for hypotension  Discussed with Tamera Stands PA-C 09/18/2020 11:38 AM

## 2020-09-18 NOTE — Progress Notes (Signed)
I observed what I thought that appeared to be A-Fib and experiencing irregular HR that falls to 39 occasionally therefore I ordered an EKG and alerted Dr. Delana Meyer, I will continue to monitor the patient,

## 2020-09-19 LAB — BASIC METABOLIC PANEL
Anion gap: 9 (ref 5–15)
BUN: 52 mg/dL — ABNORMAL HIGH (ref 8–23)
CO2: 24 mmol/L (ref 22–32)
Calcium: 8.5 mg/dL — ABNORMAL LOW (ref 8.9–10.3)
Chloride: 110 mmol/L (ref 98–111)
Creatinine, Ser: 1.82 mg/dL — ABNORMAL HIGH (ref 0.61–1.24)
GFR, Estimated: 38 mL/min — ABNORMAL LOW (ref >60)
Glucose, Bld: 91 mg/dL (ref 70–99)
Potassium: 4.4 mmol/L (ref 3.5–5.1)
Sodium: 143 mmol/L (ref 135–145)

## 2020-09-19 LAB — CBC
HCT: 27.9 % — ABNORMAL LOW (ref 39.0–52.0)
Hemoglobin: 8.4 g/dL — ABNORMAL LOW (ref 13.0–17.0)
MCH: 33.5 pg (ref 26.0–34.0)
MCHC: 30.1 g/dL (ref 30.0–36.0)
MCV: 111.2 fL — ABNORMAL HIGH (ref 80.0–100.0)
Platelets: 229 10*3/uL (ref 150–400)
RBC: 2.51 MIL/uL — ABNORMAL LOW (ref 4.22–5.81)
RDW: 14.1 % (ref 11.5–15.5)
WBC: 5.9 10*3/uL (ref 4.0–10.5)
nRBC: 0.3 % — ABNORMAL HIGH (ref 0.0–0.2)

## 2020-09-19 MED ORDER — ASPIRIN 81 MG PO TBEC: 81.0000 mg | DELAYED_RELEASE_TABLET | Freq: Every day | ORAL | 11 refills | Status: DC

## 2020-09-19 MED ORDER — CLOPIDOGREL BISULFATE 75 MG PO TABS: 75.0000 mg | ORAL_TABLET | Freq: Every day | ORAL | 10 refills | Status: DC

## 2020-09-19 MED ORDER — ATORVASTATIN CALCIUM 10 MG PO TABS: 10.0000 mg | ORAL_TABLET | Freq: Every day | ORAL | 10 refills | Status: DC

## 2020-09-19 NOTE — Discharge Summary (Signed)
Physician Discharge Summary  Patient ID: Francisco [name] MRN: [id number] DOB/AGE: [birth year] 75 y.o.  Admit date: 09/17/2020 Discharge date: 09/19/2020  Admission Diagnoses:  Discharge Diagnoses:  Active Problems:   Carotid stenosis, right   Discharged Condition: stable  Hospital Course: 75 yo with asymptomatic right carotid stenosis who had a  Right carotid stent placed on 09-17-2020.  He developed post operative AFIB and bradycardia.  He was seen by cardiology who recommended d/c with a holter monitor.  He has right leg pain which is secondary to his neurologic issues.  Consults: cardiology    Discharge Exam: Blood pressure (!) 108/48, pulse 61, temperature 98.2 F (36.8 C), temperature source Oral, resp. rate 18, height 5\' 10"  (1.778 m), weight 101.2 kg, SpO2 95 %. neuro intactright groin soft Extremities warm and well perfused with no signs of ischemia    Allergies as of 09/19/2020      Reactions   Azithromycin Other (See Comments)   Avoid due to myasthenia gravis   Codeine Nausea And Vomiting      Medication List    TAKE these medications   acetaminophen 325 MG tablet Commonly known as: TYLENOL Take 2 tablets (650 mg total) by mouth every 6 (six) hours as needed for mild pain.   alum & mag hydroxide-simeth 200-200-20 MG/5ML suspension Commonly known as: MAALOX/MYLANTA Take 30 mLs by mouth every 6 (six) hours as needed for indigestion.   aspirin 81 MG EC tablet Take 1 tablet (81 mg total) by mouth daily at 6 (six) AM. Swallow whole. Start taking on: September 20, 2020   atorvastatin 10 MG tablet Commonly known as: LIPITOR Take 1 tablet (10 mg total) by mouth daily. Start taking on: September 20, 2020   azaTHIOprine 50 MG tablet Commonly known as: IMURAN Take 150 mg by mouth daily.   clopidogrel 75 MG tablet Commonly known as: PLAVIX Take 1 tablet (75 mg total) by mouth daily at 6 (six) AM. Start taking on: September 20, 2020   furosemide 20 MG tablet Commonly  known as: LASIX Take 20 mg by mouth 2 (two) times daily as needed (swelling).   HYDROcodone-acetaminophen 5-325 MG tablet Commonly known as: NORCO/VICODIN Take 1 tablet by mouth 2 (two) times daily as needed for moderate pain.   iron polysaccharides 150 MG capsule Commonly known as: NIFEREX Take 1 capsule (150 mg total) by mouth 2 (two) times daily before lunch and supper.   levothyroxine 88 MCG tablet Commonly known as: SYNTHROID Take 88 mcg by mouth daily.   losartan 50 MG tablet Commonly known as: COZAAR Take 50 mg by mouth 2 (two) times daily.   magnesium oxide 400 (241.3 Mg) MG tablet Commonly known as: MAG-OX Take 1 tablet (400 mg total) by mouth daily.   oxybutynin 5 MG tablet Commonly known as: DITROPAN Take 5 mg by mouth 3 (three) times daily.   pantoprazole 40 MG tablet Commonly known as: PROTONIX Take 40 mg by mouth 2 (two) times daily before a meal.   polyethylene glycol 17 g packet Commonly known as: MIRALAX / GLYCOLAX Take 17 g by mouth daily. May increase to twice a day if no BM in 24 hours   predniSONE 20 MG tablet Commonly known as: DELTASONE Take 20 mg by mouth every other day.   pregabalin 100 MG capsule Commonly known as: LYRICA Take 1 capsule (100 mg total) by mouth 2 (two) times daily.   pyridostigmine 60 MG tablet Commonly known as: MESTINON Take 60 mg by mouth in  the morning and at bedtime.   rOPINIRole 4 MG tablet Commonly known as: REQUIP Take 4 mg by mouth at bedtime.   senna 8.6 MG Tabs tablet Commonly known as: SENOKOT Take 2 tablets (17.2 mg total) by mouth daily.   traZODone 50 MG tablet Commonly known as: DESYREL Take 0.5 tablets (25 mg total) by mouth at bedtime as needed for sleep.   vitamin B-12 1000 MCG tablet Commonly known as: CYANOCOBALAMIN Take 1,000 mcg by mouth daily.       Follow-up Information    Dew, Erskine Squibb, MD Follow up in 2 week(s).   Specialties: Vascular Surgery, Radiology, Interventional  Cardiology Why: First post-op visit. No studies.  Contact information: [place] [address] [phone]        Isaias Cowman, MD Follow up in 10 day(s).   Specialty: Cardiology Why: Holter monitor placed while inpatient. Needs 10 day follow up with Paras[facility].  Contact information: [place] [facility] [place] [address] [phone]               Signed: Wells Brevyn Ring 09/19/2020, 1:32 PM

## 2020-09-19 NOTE — Progress Notes (Signed)
Patient's HR has been dropping into the upper 30 intermittently. He's asymptomatic. Notified [name] NP and awaiting a response. Will continue to monitor.

## 2020-09-19 NOTE — Progress Notes (Signed)
[name] NP  messaged back indicating he is vascular patient. HR now back to the 50 s. He's asymptomatic. Requested for Vicodin 2 tabs PRN  for right leg pain of 10/10 on a pain scale. Will keep a close eyes on him and report to vascular.

## 2020-09-22 ENCOUNTER — Ambulatory Visit: Payer: Medicare Other

## 2020-09-24 ENCOUNTER — Ambulatory Visit: Payer: Medicare Other | Admitting: Occupational Therapy

## 2020-09-24 ENCOUNTER — Ambulatory Visit: Payer: Medicare Other

## 2020-09-25 ENCOUNTER — Inpatient Hospital Stay: Payer: Medicare Other | Admitting: Physical Medicine and Rehabilitation

## 2020-09-28 ENCOUNTER — Other Ambulatory Visit: Payer: Self-pay | Admitting: Physical Medicine and Rehabilitation

## 2020-09-29 ENCOUNTER — Encounter: Payer: Medicare Other | Admitting: Occupational Therapy

## 2020-09-29 ENCOUNTER — Ambulatory Visit: Payer: Medicare Other

## 2020-10-01 ENCOUNTER — Ambulatory Visit: Payer: Medicare Other | Admitting: Occupational Therapy

## 2020-10-01 ENCOUNTER — Ambulatory Visit: Payer: Medicare Other

## 2020-10-02 ENCOUNTER — Ambulatory Visit (INDEPENDENT_AMBULATORY_CARE_PROVIDER_SITE_OTHER): Payer: Medicare Other | Admitting: Vascular Surgery

## 2020-10-02 ENCOUNTER — Other Ambulatory Visit: Payer: Self-pay

## 2020-10-02 ENCOUNTER — Encounter (INDEPENDENT_AMBULATORY_CARE_PROVIDER_SITE_OTHER): Payer: Self-pay | Admitting: Vascular Surgery

## 2020-10-02 VITALS — BP 165/68 | HR 72 | Resp 16 | Wt 227.0 lb

## 2020-10-02 DIAGNOSIS — J439 Emphysema, unspecified: Secondary | ICD-10-CM

## 2020-10-02 DIAGNOSIS — G7000 Myasthenia gravis without (acute) exacerbation: Secondary | ICD-10-CM

## 2020-10-02 DIAGNOSIS — I6521 Occlusion and stenosis of right carotid artery: Secondary | ICD-10-CM

## 2020-10-02 DIAGNOSIS — I10 Essential (primary) hypertension: Secondary | ICD-10-CM

## 2020-10-02 DIAGNOSIS — E7800 Pure hypercholesterolemia, unspecified: Secondary | ICD-10-CM

## 2020-10-02 DIAGNOSIS — G959 Disease of spinal cord, unspecified: Secondary | ICD-10-CM

## 2020-10-02 NOTE — Assessment & Plan Note (Signed)
Made him a poor candidate for general anesthesia.

## 2020-10-02 NOTE — Assessment & Plan Note (Signed)
Doing well status post right carotid stent placement.  Resume all normal activities.  Continue aspirin, Plavix, and statin agent.  Return to clinic in 2 to 3 months with carotid duplex or sooner if problems develop in the interim.

## 2020-10-02 NOTE — Assessment & Plan Note (Signed)
Now status post spine surgery with significant improvement in symptoms.

## 2020-10-02 NOTE — Progress Notes (Signed)
Patient ID: Francisco [name], male   DOB: 01/11/1946, 75 y.o.   MRN: [id number]  Chief Complaint  Patient presents with   Follow-up    [facility]  2 week follow up    HPI Francisco [name] is a 75 y.o. male.  Patient returns in follow-up of his carotid disease.  He is a little over 2 weeks status post right carotid artery stent placement for high-grade ulcerative stenosis.  He was not a candidate for surgery due to his severe cervical spine disease with recent anterior cervical discectomy and fusion as well as his myasthenia gravis making him a poor candidate for general anesthesia.  He is doing well.  He spent 3 days in the hospital for some bradycardia and hypotension following stent placement but this has resolved.  His blood pressure today is 165/68 and his pulse is 72.  He is feeling much better.  For a few days, he felt very down and depressed but his spirits are good and his energy and activity level are improved from preprocedure.   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   Carotid stenosis    CKD (chronic kidney disease) stage 3, GFR 30-59 ml/min (HCC)    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   DDD (degenerative disc disease), cervical    Hypercholesterolemia    Hypertension    Hypothyroidism    pt takes Levothyroxine daily   Lumbosacral spinal stenosis    Myasthenia gravis, adult form ([place])    PAD (peripheral artery disease) (HCC)    Shortness of breath    Lung MD- Dr Darlin Coco   Sleep apnea    do not use CPAP every night    Past Surgical History:  Procedure Laterality Date   ANTERIOR CERVICAL DECOMP/DISCECTOMY FUSION  07/18/2011   Procedure: ANTERIOR CERVICAL  DECOMPRESSION/DISCECTOMY FUSION 2 LEVELS;  Surgeon: Cooper Render Pool;  Location: [employer] [facility];  Service: Neurosurgery;  Laterality: N/A;  cervical five-six, cervical six-seven anterior cervical discectomy and fusion   BACK SURGERY     in [place]     01/2020 Right, 04/2020 Left   CARDIAC CATHETERIZATION     2005 at [facility], no stents   CAROTID PTA/STENT INTERVENTION N/A 09/17/2020   Procedure: CAROTID PTA/STENT INTERVENTION;  Surgeon: Algernon Huxley, MD;  Location: [place] CV LAB;  Service: Cardiovascular;  Laterality: N/A;   CATARACT EXTRACTION W/PHACO Left 01/06/2020   Procedure: CATARACT EXTRACTION PHACO AND INTRAOCULAR LENS PLACEMENT (IOC) ISTENT INJ LEFT 3.81  00:33.3;  Surgeon: Eulogio Bear, MD;  Location: [place];  Service: Ophthalmology;  Laterality: Left;   CATARACT EXTRACTION W/PHACO Right 02/03/2020   Procedure: CATARACT EXTRACTION PHACO AND INTRAOCULAR LENS PLACEMENT ([place]) RIGHT ISTENT INJ;  Surgeon: Eulogio Bear, MD;  Location: [place];  Service: Ophthalmology;  Laterality: Right;  4.29 0:35.6   COLONOSCOPY     HERNIA REPAIR Left    inguinal hernia repair in 1985   LUMBAR LAMINECTOMY/DECOMPRESSION MICRODISCECTOMY Left 02/24/2014   Procedure: LUMBAR LAMINECTOMY/DECOMPRESSION MICRODISCECTOMY LUMBAR THREE-FOUR, FOUR-FIVE, LEFT FIVE-SACRAL ONE ;  Surgeon: Charlie Pitter, MD;  Location: [place] [facility];  Service: Neurosurgery;  Laterality: Left;  LUMBAR LAMINECTOMY/DECOMPRESSION MICRODISCECTOMY LUMBAR THREE-FOUR, FOUR-FIVE, LEFT FIVE-SACRAL ONE    POSTERIOR CERVICAL FUSION/FORAMINOTOMY N/A 08/07/2020   Procedure: C3-6 POSTERIOR FUSION WITH DECOMPRESSION;  Surgeon: Meade Maw, MD;  Location: [facility] ORS;  Service: Neurosurgery;  Laterality: N/A;   PROSTATECTOMY  8/14   [facility] Dr Mare Ferrari       Allergies  Allergen Reactions   Azithromycin Other (See Comments)    Avoid due to myasthenia gravis    Codeine Nausea And Vomiting    Current Outpatient Medications  Medication Sig Dispense Refill   aspirin EC 81 MG EC tablet Take 1 tablet (81 mg total) by mouth daily at 6 (six) AM. Swallow whole. 30 tablet 11   atorvastatin (LIPITOR) 10 MG tablet Take 1 tablet (10 mg total) by mouth daily. 30 tablet 10   azaTHIOprine (IMURAN) 50 MG tablet Take 150 mg by mouth daily.      clopidogrel (PLAVIX) 75 MG tablet Take 1 tablet (75 mg total) by mouth daily at 6 (six) AM. 30 tablet 10   furosemide (LASIX) 20 MG tablet Take 20 mg by mouth 2 (two) times daily as needed (swelling).      HYDROcodone-acetaminophen (NORCO/VICODIN) 5-325 MG tablet Take 1 tablet by mouth 2 (two) times daily as needed for moderate pain.     iron polysaccharides (NIFEREX) 150 MG capsule Take 1 capsule (150 mg total) by mouth 2 (two) times daily before lunch and supper. 60 capsule 0   levothyroxine (SYNTHROID, LEVOTHROID) 88 MCG tablet Take 88 mcg by mouth daily.     losartan (COZAAR) 50 MG tablet Take 50 mg by mouth 2 (two) times daily.      oxybutynin (DITROPAN) 5 MG tablet Take 5 mg by mouth 3 (three) times daily.     pantoprazole (PROTONIX) 40 MG tablet Take 40 mg by mouth 2 (two) times daily before a meal.      predniSONE (DELTASONE) 20 MG tablet Take 20 mg by mouth every other day.      pyridostigmine (MESTINON) 60 MG tablet Take 60 mg by mouth in the morning and at bedtime.      rOPINIRole (REQUIP) 4 MG tablet Take 4 mg by mouth at bedtime.      traZODone (DESYREL) 50 MG tablet Take 0.5 tablets (25 mg total) by mouth at bedtime as needed for sleep. 15 tablet 0   acetaminophen (TYLENOL) 325 MG tablet Take 2 tablets (650 mg total) by mouth every 6 (six) hours as needed for mild pain. (Patient not taking: No sig reported)     alum & mag hydroxide-simeth (MAALOX/MYLANTA) 200-200-20 MG/5ML suspension Take 30 mLs by mouth every 6 (six) hours as needed for indigestion. (Patient not taking: No sig reported) 355 mL 0    magnesium oxide (MAG-OX) 400 (241.3 Mg) MG tablet Take 1 tablet (400 mg total) by mouth daily. (Patient not taking: No sig reported) 30 tablet 0   polyethylene glycol (MIRALAX / GLYCOLAX) 17 g packet Take 17 g by mouth daily. May increase to twice a day if no BM in 24 hours (Patient not taking: No sig reported) 60 each 0   pregabalin (LYRICA) 100 MG capsule Take 1 capsule (100 mg total) by mouth 2 (two) times daily. (Patient not taking: No sig reported) 60 capsule 0   senna (SENOKOT) 8.6 MG TABS tablet Take 2 tablets (17.2 mg total) by mouth daily. (Patient not taking: No sig reported) 120 tablet 0  vitamin B-12 (CYANOCOBALAMIN) 1000 MCG tablet Take 1,000 mcg by mouth daily. (Patient not taking: No sig reported)     No current facility-administered medications for this visit.        Physical Exam BP (!) 165/68 (BP Location: Right Arm)    Pulse 72    Resp 16    Wt 227 lb (103 kg)    BMI 32.57 kg/m  Gen:  WD/WN, NAD Skin: incision C/D/I     Assessment/Plan: COPD (chronic obstructive pulmonary disease) Followed by Pulmonology  Essential hypertension, benign blood pressure control important in reducing the progression of atherosclerotic disease. On appropriate oral medications.  Pure hypercholesterolemia lipid control important in reducing the progression of atherosclerotic disease. Continue statin therapy  Cervical myelopathy (HCC) Now status post spine surgery with significant improvement in symptoms.  Myasthenia gravis Made him a poor candidate for general anesthesia.  Carotid stenosis, right Doing well status post right carotid stent placement.  Resume all normal activities.  Continue aspirin, Plavix, and statin agent.  Return to clinic in 2 to 3 months with carotid duplex or sooner if problems develop in the interim.      Leotis Pain 10/02/2020, 11:03 AM   This note was created with Dragon medical transcription system.  Any errors from dictation are  unintentional.

## 2020-10-06 ENCOUNTER — Other Ambulatory Visit: Payer: Self-pay

## 2020-10-06 ENCOUNTER — Ambulatory Visit: Payer: Medicare Other | Admitting: Physical Therapy

## 2020-10-06 ENCOUNTER — Ambulatory Visit: Payer: Medicare Other | Admitting: Occupational Therapy

## 2020-10-06 DIAGNOSIS — G8929 Other chronic pain: Secondary | ICD-10-CM

## 2020-10-06 DIAGNOSIS — R2681 Unsteadiness on feet: Secondary | ICD-10-CM | POA: Diagnosis present

## 2020-10-06 DIAGNOSIS — R278 Other lack of coordination: Secondary | ICD-10-CM

## 2020-10-06 DIAGNOSIS — M5442 Lumbago with sciatica, left side: Secondary | ICD-10-CM | POA: Insufficient documentation

## 2020-10-06 DIAGNOSIS — M6281 Muscle weakness (generalized): Secondary | ICD-10-CM

## 2020-10-06 DIAGNOSIS — G959 Disease of spinal cord, unspecified: Secondary | ICD-10-CM | POA: Insufficient documentation

## 2020-10-06 DIAGNOSIS — M792 Neuralgia and neuritis, unspecified: Secondary | ICD-10-CM | POA: Insufficient documentation

## 2020-10-06 DIAGNOSIS — I480 Paroxysmal atrial fibrillation: Secondary | ICD-10-CM | POA: Insufficient documentation

## 2020-10-06 NOTE — Therapy (Signed)
[place] MAIN [facility] SERVICES [address] [place], [place], [address] Phone: [phone]   Fax:  [phone]  Physical Therapy Treatment/Recertification  Patient Details  Name: Francisco [name] MRN: [id number] Date of Birth: Feb 20, 1946 Referring Provider (PT): Lauraine Rinne, PA-C   Encounter Date: 10/06/2020   PT End of Session - 10/06/20 1008    Visit Number 6    Number of Visits 25    Date for PT Re-Evaluation 12/29/20    Authorization Type [employer] Medicare    Authorization Time Period 08/25/20-11/17/20    PT Start Time [id number]    PT Stop Time 1100    PT Time Calculation (min) 45 min    Equipment Utilized During Treatment Gait belt;Cervical collar    Activity Tolerance Patient tolerated treatment well;No increased pain;Patient limited by fatigue    Behavior During Therapy [facility] for tasks assessed/performed           Past Medical History:  Diagnosis Date  . Arthritis    lower left hip  . Atypical angina ([place])   . Bilateral hand numbness    from back surgery  . Bronchitis, chronic ([place])   . 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 Carotid stenosis   . CKD (chronic kidney disease) stage 3, GFR 30-59 ml/min (HCC)   . 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 DDD (degenerative disc disease), cervical   . Hypercholesterolemia   . Hypertension   . Hypothyroidism    pt takes Levothyroxine daily  . Lumbosacral spinal stenosis   . Myasthenia gravis, adult form ([place])   . PAD (peripheral artery disease) ([place])   . Shortness of breath    Lung MD- Dr Darlin Coco  . Sleep apnea    do not use CPAP every night    Past Surgical History:  Procedure Laterality Date  . ANTERIOR CERVICAL DECOMP/DISCECTOMY FUSION  07/18/2011   Procedure: ANTERIOR CERVICAL DECOMPRESSION/DISCECTOMY FUSION 2 LEVELS;  Surgeon: Cooper Render Pool;  Location: [place] NEURO ORS;   Service: Neurosurgery;  Laterality: N/A;  cervical five-six, cervical six-seven anterior cervical discectomy and fusion  . BACK SURGERY     in [place]     01/2020 Right, 04/2020 Left  . CARDIAC CATHETERIZATION     2005 at [facility], no stents  . CAROTID PTA/STENT INTERVENTION N/A 09/17/2020   Procedure: CAROTID PTA/STENT INTERVENTION;  Surgeon: Algernon Huxley, MD;  Location: [place] CV LAB;  Service: Cardiovascular;  Laterality: N/A;  . CATARACT EXTRACTION W/PHACO Left 01/06/2020   Procedure: CATARACT EXTRACTION PHACO AND INTRAOCULAR LENS PLACEMENT (IOC) ISTENT INJ LEFT 3.81  00:33.3;  Surgeon: Eulogio Bear, MD;  Location: [place];  Service: Ophthalmology;  Laterality: Left;  . CATARACT EXTRACTION W/PHACO Right 02/03/2020   Procedure: CATARACT EXTRACTION PHACO AND INTRAOCULAR LENS PLACEMENT ([place]) RIGHT ISTENT INJ;  Surgeon: Eulogio Bear, MD;  Location: [place];  Service: Ophthalmology;  Laterality: Right;  4.29 0:35.6  . COLONOSCOPY    . HERNIA REPAIR Left    inguinal hernia repair in 1985  . LUMBAR LAMINECTOMY/DECOMPRESSION MICRODISCECTOMY Left 02/24/2014   Procedure: LUMBAR LAMINECTOMY/DECOMPRESSION MICRODISCECTOMY LUMBAR THREE-FOUR, FOUR-FIVE, LEFT FIVE-SACRAL ONE ;  Surgeon: Charlie Pitter, MD;  Location: [place] NEURO ORS;  Service: Neurosurgery;  Laterality: Left;  LUMBAR LAMINECTOMY/DECOMPRESSION MICRODISCECTOMY LUMBAR THREE-FOUR, FOUR-FIVE, LEFT FIVE-SACRAL ONE   .  POSTERIOR CERVICAL FUSION/FORAMINOTOMY N/A 08/07/2020   Procedure: C3-6 POSTERIOR FUSION WITH DECOMPRESSION;  Surgeon: Meade Maw, MD;  Location: [facility] ORS;  Service: Neurosurgery;  Laterality: N/A;  . PROSTATECTOMY  8/14   [facility] Dr Mare Ferrari      Subjective Assessment - 10/06/20 1242    Subjective Patient has been unable to attend physical therapy in the past few weeks due to residual weakness from having a carotid stent placed on 09/17/2020 and  due to poor weather conditions. He denies of any falls or injuries since last therapy session. He reports of severe weakness with activity and has to continuously have to break. He is highly motivated to start physical therapy again.    Pertinent History [name] is a 75yoM referred to OPPT neuro s/p myelopathy s/p cervical decompression and fusion. Pt sustained several months of general decline in mobility prior to surgical decompression. Pt went to inpatient rehab after hospitalization. [facility].  Pt underwent C3-6 decompression, fusion on 11/26. PMH: myasthenia gravis, 2012 ACDF 5/6, 6/7; OSA, hypothyroidism, HTN, COPD, multiple back surgeries, 2021 carpal tunnel release, referred to OPOT, but only seen for evaluation. 8/10 OPPT evaluation for LSS c radiating pain into the left hip, only completed evaluation session. September - November progressive decline in leg strength and tolerance to upright activity. Also noted a decline in tool manipulation for ADL (pen and meal utensil). Seen by OT and PT as an outpatient for other potential etiology (Carpal tunnel syndrome, and lumbar spinal stenosis), but eventually identified to be related to cervical myelopathy. Several months prior has no limitations in mobility or function. Pt scheduled for Jan 6th: Carotid endarterectomy c Dr. Leotis Pain. Pt has a long history of lumbar DJD, s/p L5/S1 decompression discectomy x2, L5 nerve root injections. Most recent Lumbar MRI (11/10/18) revealin gof advanced formaminal stenosis of L3/4, L4/5, L5/S1. Pt is has had LLE neurological weakness and pain since 2015 and has been followed by neurosurgery.    Limitations Walking;Lifting;Standing;House hold activities    How long can you sit comfortably? Not limited    How long can you stand comfortably? 3-5 minutes    How long can you walk comfortably? 10 minutes    Patient Stated Goals return to functional baseline status of Summer 2021.    Currently  in Pain? No/denies    Pain Onset More than a month ago         Seated vital signs in the beginning of session:  BP : 139/62 HR: 78 SaO2: 98%   5STS: 31 seconds  SaO2: 98% HR: 90 bpm  TUG: 30.26s with 2WW SaO2: 97% HR: 101 bpm  10MWT: 26.55s with 2WW SaO: 97% HR: 104 bpm  6MWT:  165 ft, 1 seated rest break for 2 mins at 3 min mark  (SaO2: 97%, 88 bpm) Post ambulation: 142/61, 98%, HR 82    Clinical Impression: Patient demonstrates excellent motivation during session today. Outcome measures performed at today's therapy session due to long break since last therapy session. Patient demonstrates increased fall risk with TUG score of 30.26 seconds and 0.38 m/s at self-selected speed. Patient demonstrates decreased cardiovascular endurance as evidenced by ambulation for 165 ft on 6MWT with 1 seated rest break in the middle of the test. Upon checking BP throughout therapy session, patient's diastolic value was below 70 with every reading. Patient reports that he stopped taking his BP medication. Therapist encouraged patient to reach out to doctor for appropriate dosage and frequency of BP  medication. Patient will continue to benefit from skilled physical therapy to improve generalized strength, ROM, and capacity for functional activity. Patient's condition has the potential to improve in response to therapy. Maximum improvement is yet to be obtained. The anticipated improvement is attainable and reasonable in a generally predictable time.     PT Education - 10/06/20 1556    Education provided Yes    Education Details HEP: seated marches, seated heel raises, seated LAQ    Person(s) Educated Patient    Methods Explanation;Demonstration;Handout    Comprehension Verbalized understanding;Returned demonstration;Verbal cues required            PT Short Term Goals - 10/06/20 1527      PT SHORT TERM GOAL #1   Title After 6 weeks patient will be independent in HEP to progress strength,  AMB, and balance.    Time 6    Period Weeks    Status New    Target Date 11/17/20      PT SHORT TERM GOAL #2   Title After 6 weeks pt will demonstrate improved 5xSTS hands free chair + airex foam in <20sec    Baseline 01/25: 31s hands free without airex foam    Time 6    Period Weeks    Status New    Target Date 11/17/20             PT Long Term Goals - 10/06/20 1528      PT LONG TERM GOAL #1   Title Patient will reduce timed up and go to <11 seconds to reduce fall risk and demonstrate improved transfer/gait ability.    Baseline 01/25: 30.26s    Time 12    Period Weeks    Status New    Target Date 12/29/20      PT LONG TERM GOAL #2   Title Patient will demonstrate 6MWT (LRAD) >730ft to demonstrate improved capacity to access the community.    Baseline 12/16: 120 ft, 01/25: 165 ft with 1 seated rest break for 2 mins    Time 12    Period Weeks    Status On-going    Target Date 12/29/20      PT LONG TERM GOAL #3   Title Patient will increase 10 meter walk test to >1.68m/s as to improve gait speed for better community ambulation and to reduce fall risk.    Baseline 01/25: 0.62m/s    Time 12    Period Weeks    Status New    Target Date 12/29/20      PT LONG TERM GOAL #4   Title After 12 weeks pt to demonstrate improved FOTO score >70    Baseline 01/25: 51    Time 12    Period Weeks    Status On-going    Target Date 12/29/20      PT LONG TERM GOAL #5   Title Patient (> 14 years old) will complete five times sit to stand test in < 15 seconds indicating an increased LE strength and improved balance.    Baseline 01/25: 31s    Time 12    Period Weeks    Status New    Target Date 12/29/20                 Plan - 10/06/20 1555    Clinical Impression Statement Patient demonstrates excellent motivation during session today. Outcome measures performed at today's therapy session due to long break since last therapy session. Patient demonstrates increased  fall risk  with TUG score of 30.26 seconds and 0.38 m/s at self-selected speed. Patient demonstrates decreased cardiovascular endurance as evidenced by ambulation for 165 ft on 6MWT with 1 seated rest break in the middle of the test. Upon checking BP throughout therapy session, patient's diastolic value was below 70 with every reading. Patient reports that he stopped taking his BP medication. Therapist encouraged patient to reach out to doctor for appropriate dosage and frequency of BP medication. Patient will continue to benefit from skilled physical therapy to improve generalized strength, ROM, and capacity for functional activity. Patient's condition has the potential to improve in response to therapy. Maximum improvement is yet to be obtained. The anticipated improvement is attainable and reasonable in a generally predictable time.    Personal Factors and Comorbidities Age;Comorbidity 2;Past/Current Experience;Time since onset of injury/illness/exacerbation    Comorbidities COPD, History of Cancer, myasthenia gravis, chronic lumbar surgical history.    Examination-Activity Limitations Lift;Squat;Bend;Stairs;Stand;Transfers;Insurance claims handler;Bathing;Hygiene/Grooming;Dressing;Toileting    Examination-Participation Restrictions Yard Work;Church;Cleaning;Driving    Stability/Clinical Decision Making Evolving/Moderate complexity    Rehab Potential Good    Clinical Impairments Affecting Rehab Potential PMH    PT Frequency 2x / week    PT Duration 12 weeks    PT Treatment/Interventions ADLs/Self Care Home Management;Electrical Stimulation;Moist Heat;Traction;Ultrasound;Gait training;Stair training;Functional mobility training;Therapeutic activities;Therapeutic exercise;Balance training;Neuromuscular re-education;Patient/family education;Manual techniques;Passive range of motion;Dry needling;Joint Manipulations;Cryotherapy    PT Next Visit Plan balance, strength    PT Home Exercise Plan HEP: seated  marches, seated heel raises, seated LAQ    Consulted and Agree with Plan of Care Patient           Patient will benefit from skilled therapeutic intervention in order to improve the following deficits and impairments:  Abnormal gait,Decreased balance,Decreased mobility,Decreased endurance,Difficulty walking,Hypomobility,Impaired sensation,Decreased range of motion,Impaired perceived functional ability,Decreased activity tolerance,Decreased coordination,Decreased strength,Impaired flexibility,Postural dysfunction,Pain  Visit Diagnosis: Muscle weakness (generalized)  Cervical myelopathy (HCC)  Chronic left-sided low back pain with left-sided sciatica  Unsteadiness on feet  Other lack of coordination     Problem List Patient Active Problem List   Diagnosis Date Noted  . Carotid stenosis, right 09/17/2020  . S/P cervical spinal fusion   . Leukocytosis   . Essential hypertension   . Anemia of chronic disease   . Postoperative pain   . Neuropathic pain   . Cervical myelopathy ([place]) 08/07/2020  . Preop cardiovascular exam 07/17/2020  . SOB (shortness of breath) on exertion 07/17/2020  . Leg weakness, bilateral 07/13/2020  . Aortic atherosclerosis ([place]) 07/09/2020  . Myalgia 10/30/2019  . PAD (peripheral artery disease) ([place]) 06/06/2019  . CKD (chronic kidney disease) stage 3, GFR 30-59 ml/min (HCC) 04/29/2019  . B12 deficiency 01/23/2019  . Left arm numbness 02/28/2018  . Neck pain 02/28/2018  . Anemia 02/02/2017  . DDD (degenerative disc disease), cervical 02/02/2017  . Hypothyroid 02/02/2017  . MRSA (methicillin resistant staph aureus) culture positive 02/02/2017  . Nocturnal hypoxia 02/02/2017  . Senile purpura ([place]) 10/03/2016  . Essential hypertension, benign 09/16/2016  . Bilateral carotid artery disease ([place]) 09/16/2016  . 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/18/2016  . 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 History of prostate cancer 12/21/2015  . Left carpal  tunnel syndrome 11/11/2015  . Kidney stone on left side 07/05/2015  . Myasthenia gravis ([place]) 05/26/2015  . Long-term use of high-risk medication 10/15/2014  . Persistent cough 09/10/2014  . Pure hypercholesterolemia 07/25/2014  . Spinal stenosis, lumbar region, with neurogenic claudication 02/24/2014  . Lumbosacral  stenosis with neurogenic claudication ([place]) 02/24/2014  . COPD (chronic obstructive pulmonary disease) ([place]) 01/22/2014   Karl Luke PT, DPT Netta Corrigan 10/06/2020, 5:55 PM  [place] MAIN [facility] SERVICES [address] [place], [place], [address] Phone: [phone]   Fax:  [phone]  Name: QUILL RAJARAM MRN: [id number] Date of Birth: August 24, 1946

## 2020-10-06 NOTE — Therapy (Signed)
[place] MAIN [facility] SERVICES [address] [place], [place], [address] Phone: [phone]   Fax:  [phone]  Occupational Therapy Treatment/Recertification  Patient Details  Name: Francisco [name] MRN: [id number] Date of Birth: 09/04/1946 Referring Provider (OT): Dr Annette Stable   Encounter Date: 10/06/2020   OT End of Session - 10/06/20 1137    Visit Number 3    Number of Visits 12    Date for OT Re-Evaluation 12/29/20    OT Start Time 1103    OT Stop Time 1145    OT Time Calculation (min) 42 min    Activity Tolerance Patient tolerated treatment well    Behavior During Therapy [facility] for tasks assessed/performed           Past Medical History:  Diagnosis Date  . Arthritis    lower left hip  . Atypical angina ([place])   . Bilateral hand numbness    from back surgery  . Bronchitis, chronic ([place])   . Cancer [facility])    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; Merkel cell cancer, and Basal cell cancer (twice; 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  . Carotid stenosis   . CKD (chronic kidney disease) stage 3, GFR 30-59 ml/min (HCC)   . 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 DDD (degenerative disc disease), cervical   . Hypercholesterolemia   . Hypertension   . Hypothyroidism    pt takes Levothyroxine daily  . Lumbosacral spinal stenosis   . Myasthenia gravis, adult form ([place])   . PAD (peripheral artery disease) ([place])   . Shortness of breath    Lung MD- Dr Darlin Coco  . Sleep apnea    do not use CPAP every night    Past Surgical History:  Procedure Laterality Date  . ANTERIOR CERVICAL DECOMP/DISCECTOMY FUSION  07/18/2011   Procedure: ANTERIOR CERVICAL DECOMPRESSION/DISCECTOMY FUSION 2 LEVELS;  Surgeon: Cooper Render Pool;  Location: [place] [facility] ORS;  Service: Neurosurgery;  Laterality: N/A;  cervical five-six, cervical six-seven anterior cervical discectomy and fusion  . BACK SURGERY     in [place]      01/2020 Right, 04/2020 Left  . CARDIAC CATHETERIZATION     2005 at [facility], no stents  . CAROTID PTA/STENT INTERVENTION N/A 09/17/2020   Procedure: CAROTID PTA/STENT INTERVENTION;  Surgeon: Algernon Huxley, MD;  Location: [place] CV LAB;  Service: Cardiovascular;  Laterality: N/A;  . CATARACT EXTRACTION W/PHACO Left 01/06/2020   Procedure: CATARACT EXTRACTION PHACO AND INTRAOCULAR LENS PLACEMENT (IOC) ISTENT INJ LEFT 3.81  00:33.3;  Surgeon: Eulogio Bear, MD;  Location: [place];  Service: Ophthalmology;  Laterality: Left;  . CATARACT EXTRACTION W/PHACO Right 02/03/2020   Procedure: CATARACT EXTRACTION PHACO AND INTRAOCULAR LENS PLACEMENT ([place]) RIGHT ISTENT INJ;  Surgeon: Eulogio Bear, MD;  Location: [place];  Service: Ophthalmology;  Laterality: Right;  4.29 0:35.6  . COLONOSCOPY    . HERNIA REPAIR Left    inguinal hernia repair in 1985  . LUMBAR LAMINECTOMY/DECOMPRESSION MICRODISCECTOMY Left 02/24/2014   Procedure: LUMBAR LAMINECTOMY/DECOMPRESSION MICRODISCECTOMY LUMBAR THREE-FOUR, FOUR-FIVE, LEFT FIVE-SACRAL ONE ;  Surgeon: Charlie Pitter, MD;  Location: [place] NEURO ORS;  Service: Neurosurgery;  Laterality: Left;  LUMBAR LAMINECTOMY/DECOMPRESSION MICRODISCECTOMY LUMBAR THREE-FOUR, FOUR-FIVE, LEFT FIVE-SACRAL ONE   . POSTERIOR CERVICAL FUSION/FORAMINOTOMY N/A 08/07/2020   Procedure: C3-6 POSTERIOR FUSION WITH DECOMPRESSION;  Surgeon: Meade Maw, MD;  Location: [facility] ORS;  Service: Neurosurgery;  Laterality: N/A;  .  PROSTATECTOMY  8/14   [facility] Dr Mare Ferrari     There were no vitals filed for this visit.   Subjective Assessment - 10/06/20 1135    Subjective  Pt. reports that he is ready to continue therapy    Pertinent History Pt is a 75 y/o M with hx of cervical myelopathy & admitted on 08/07/20 for scheduled C3-6 Posterior fusion with decompression by Dr. Izora Ribas. PMH: atypical angina, PAD, carotid stenosis, HTN, HLD, COPD, Myasthenia gravis,  hypothyroidism, CKD-III, OSA requiring nocturnal PAP therapy, OA, cervical DDD, lumbosacral spinal stenosis, chronic DOE 2/2 COPD.  Pt went to IP rehab and was discharged on 08/20/2020 with orders for OP OT and PT.  History of carpal tunnel bilaterally.    Patient Stated Goals Pt would like to be able to walk again, and be independent as he was before.    Currently in Pain? No/denies              [facility] OT Assessment - 10/06/20 0001      Coordination   Right 9 Hole Peg Test 33    Left 9 Hole Peg Test 50      Strength   Right Hand Grip (lbs) 55    Right Hand Lateral Pinch 15 lbs    Right Hand 3 Point Pinch 12 lbs    Left Hand Grip (lbs) 47    Left Hand Lateral Pinch 15 lbs    Left Hand 3 Point Pinch 12 lbs          OT TREATMENT    Neuro muscular re-education:  Pt. worked on grasping 1", 3/4", and 1/2" washers, storing them in his hand, and translatory movements moving them through his hand from his palm to the tip of his 2nd digit, and thumb. Pt. Worked on stacking washers, and coins with his bilateral hands. Pt. worked on grasping coins from a tabletop surface, placing them into a resistive container, and pushing them through the slot while isolating his 2nd digit.   Therapeutic Exercise:  Pt. performed 3# dowel ex. For UE strengthening secondary to weakness. Bilateral shoulder flexion, chest press, circular patterns, and elbow flexion/extension were performed.Pt. performed gross gripping with grip strengthener. Pt. worked on sustaining grip while grasping pegs and reaching at various heights. The gripper was set to 23.4# of grip strength force. Pt. Worked on pinch strengthening in the left hand for lateral, and 3pt. pinch using yellow, red, green, and blue resistive clips. Pt. worked on placing the clips at various vertical and horizontal angles. Tactile and verbal cues were required for eliciting the desired movement.  Measurements were obtained, and goals were reviewed with the  patient. Pt. was seen today for his 2nd treatment session since the initial evaluation secondary to scheduling issues. Pt. Continues to present with limited Bilateral UE strength, endurance, decreased distal grip, and pinch strength, and impaired motor control, and [facility] skills. Pt. Continues to have difficulty with hand function skills, and translatory movements of his bilateral hands with objects sliding from between his fingers, and from the ulnar aspect of his palms. Pt. Continues to work on [employer], and continues to work towards the same treatment plan, and goals in order to work towards improving engagement of his [place] during ADLs, and IADL tasks.                      OT Education - 10/06/20 1137    Education Details ther. ex, ROM, goals  Person(s) Educated Patient    Methods Explanation;Demonstration;Handout;Verbal cues;Tactile cues    Comprehension Verbalized understanding;Returned demonstration;Verbal cues required               OT Long Term Goals - 10/06/20 1113      OT LONG TERM GOAL #1   Title Patient to be independent with home exercise program for strengthening and coordination skills.    Baseline decreased strength and coordination bilaterally after cervical surgery    Time 6    Period Weeks    Status On-going    Target Date 12/29/20      OT LONG TERM GOAL #2   Title Patient will improve grip strength bilaterally by 8# to hold objects securely without dropping.    Baseline Improving grip strength. Refer to [employer]    Time 12    Period Weeks    Status On-going    Target Date 12/29/20      OT LONG TERM GOAL #3   Title Patient will perform self care tasks with modified independence.    Baseline Independent with morning care    Status Achieved      OT LONG TERM GOAL #4   Title Pt will complete light homemaking tasks with modified independence.    Baseline wife conitnues to assist    Time 12    Period Weeks    Status On-going     Target Date 12/29/20      OT LONG TERM GOAL #5   Title Patient will improve bilateral shoulder ROM and strength to place items on shelves at shoulder height with modified independence.    Baseline Pt. is able to reach items on shelves.    Status Achieved      OT LONG TERM GOAL #6   Title Pt will improve left UE coordination by decreasing time on 9 hole peg test by 10 secs to be able to manipulate and complete buttons with modified independence.    Baseline Pt. continues to have difficulty.refer to flow sheet for measurements    Time 12    Status On-going    Target Date 12/29/20                 Plan - 10/06/20 1138    Clinical Impression Statement Measurements were obtained, and goals were reviewed with the patient. Pt. was seen today for his 2nd treatment session since the initial evaluation secondary to scheduling issues. Pt. Continues to present with limited Bilateral UE strength, endurance, decreased distal grip, and pinch strength, and impaired motor control, and [facility] skills. Pt. Continues to have difficulty with hand function skills, and translatory movements of his bilateral hands with objects sliding from between his fingers, and from the ulnar aspect of his palms. Pt. Continues to work on [employer], and continues to work towards the same treatment plan, and goals in order to work towards improving engagement of his [place] during ADLs, and IADL tasks.    OT Occupational Profile and History Detailed Assessment- Review of Records and additional review of physical, cognitive, psychosocial history related to current functional performance    Occupational performance deficits (Please refer to evaluation for details): ADL's;IADL's;Leisure;Work    Marketing executive / Function / Physical Skills ADL;Flexibility;ROM;UE functional use;FMC;Dexterity;Sensation;Strength;IADL;Coordination;Mobility;Decreased knowledge of use of DME;Balance;Endurance    Psychosocial Skills Environmental   Adaptations;Habits;Routines and Behaviors    Rehab Potential Good    Clinical Decision Making Several treatment options, min-mod task modification necessary    Comorbidities Affecting Occupational Performance: Presence  of comorbidities impacting occupational performance    Comorbidities impacting occupational performance description: age, carpal tunnel history, COPD, chronic lumbar surgical history, history of myasthenia gravis, has a full time job and working currently, unable to drive    Modification or Assistance to Complete Evaluation  No modification of tasks or assist necessary to complete eval    OT Frequency 2x / week    OT Duration 6 weeks    OT Treatment/Interventions Self-care/ADL training;Therapeutic exercise;Neuromuscular education;Patient/family education;Therapeutic activities;Cryotherapy;Functional Mobility Training;DME and/or AE instruction;Balance training;Moist Heat    Consulted and Agree with Plan of Care Patient           Patient will benefit from skilled therapeutic intervention in order to improve the following deficits and impairments:   Body Structure / Function / Physical Skills: ADL,Flexibility,ROM,UE functional use,FMC,Dexterity,Sensation,Strength,IADL,Coordination,Mobility,Decreased knowledge of use of DME,Balance,Endurance   Psychosocial Skills: Environmental  Adaptations,Habits,Routines and Behaviors   Visit Diagnosis: Muscle weakness (generalized)  Other lack of coordination    Problem List Patient Active Problem List   Diagnosis Date Noted  . Carotid stenosis, right 09/17/2020  . S/P cervical spinal fusion   . Leukocytosis   . Essential hypertension   . Anemia of chronic disease   . Postoperative pain   . Neuropathic pain   . Cervical myelopathy ([place]) 08/07/2020  . Preop cardiovascular exam 07/17/2020  . SOB (shortness of breath) on exertion 07/17/2020  . Leg weakness, bilateral 07/13/2020  . Aortic atherosclerosis ([place]) 07/09/2020  .  Myalgia 10/30/2019  . PAD (peripheral artery disease) ([place]) 06/06/2019  . CKD (chronic kidney disease) stage 3, GFR 30-59 ml/min (HCC) 04/29/2019  . B12 deficiency 01/23/2019  . Left arm numbness 02/28/2018  . Neck pain 02/28/2018  . Anemia 02/02/2017  . DDD (degenerative disc disease), cervical 02/02/2017  . Hypothyroid 02/02/2017  . MRSA (methicillin resistant staph aureus) culture positive 02/02/2017  . Nocturnal hypoxia 02/02/2017  . Senile purpura ([place]) 10/03/2016  . Essential hypertension, benign 09/16/2016  . Bilateral carotid artery disease ([place]) 09/16/2016  . 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/18/2016  . 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 History of prostate cancer 12/21/2015  . Left carpal tunnel syndrome 11/11/2015  . Kidney stone on left side 07/05/2015  . Myasthenia gravis ([place]) 05/26/2015  . Long-term use of high-risk medication 10/15/2014  . Persistent cough 09/10/2014  . Pure hypercholesterolemia 07/25/2014  . Spinal stenosis, lumbar region, with neurogenic claudication 02/24/2014  . Lumbosacral stenosis with neurogenic claudication ([place]) 02/24/2014  . COPD (chronic obstructive pulmonary disease) ([place]) 01/22/2014    Harrel Carina, MS, OTR/L 10/06/2020, 11:59 AM  [place] MAIN [facility] SERVICES [address] [place], [place], [address] Phone: [phone]   Fax:  [phone]  Name: Francisco [name] MRN: [id number] Date of Birth: 12/16/1945

## 2020-10-08 ENCOUNTER — Ambulatory Visit: Payer: Medicare Other

## 2020-10-12 ENCOUNTER — Ambulatory Visit: Payer: Medicare Other | Admitting: Occupational Therapy

## 2020-10-12 ENCOUNTER — Ambulatory Visit: Payer: Medicare Other

## 2020-10-12 ENCOUNTER — Other Ambulatory Visit: Payer: Self-pay

## 2020-10-12 ENCOUNTER — Encounter: Payer: Self-pay | Admitting: Occupational Therapy

## 2020-10-12 DIAGNOSIS — M6281 Muscle weakness (generalized): Secondary | ICD-10-CM

## 2020-10-12 DIAGNOSIS — R278 Other lack of coordination: Secondary | ICD-10-CM

## 2020-10-12 DIAGNOSIS — M5442 Lumbago with sciatica, left side: Secondary | ICD-10-CM

## 2020-10-12 DIAGNOSIS — G8929 Other chronic pain: Secondary | ICD-10-CM

## 2020-10-12 DIAGNOSIS — R2681 Unsteadiness on feet: Secondary | ICD-10-CM

## 2020-10-12 NOTE — Therapy (Signed)
[place] MAIN [facility] SERVICES [address] [place], [place], [address] Phone: [phone]   Fax:  [phone]  Occupational Therapy Treatment  Patient Details  Name: Francisco [name] MRN: [id number] Date of Birth: November 04, 1945 Referring Provider (OT): Dr Annette Stable   Encounter Date: 10/12/2020   OT End of Session - 10/12/20 1602    Visit Number 4    Number of Visits 12    Date for OT Re-Evaluation 12/29/20    OT Start Time 1510    OT Stop Time 1555    OT Time Calculation (min) 45 min    Activity Tolerance Patient tolerated treatment well    Behavior During Therapy [facility] for tasks assessed/performed           Past Medical History:  Diagnosis Date  . Arthritis    lower left hip  . Atypical angina ([place])   . Bilateral hand numbness    from back surgery  . Bronchitis, chronic ([place])   . Cancer [facility])    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; Merkel cell cancer, and Basal cell cancer (twice; 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  . Carotid stenosis   . CKD (chronic kidney disease) stage 3, GFR 30-59 ml/min (HCC)   . 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 DDD (degenerative disc disease), cervical   . Hypercholesterolemia   . Hypertension   . Hypothyroidism    pt takes Levothyroxine daily  . Lumbosacral spinal stenosis   . Myasthenia gravis, adult form ([place])   . PAD (peripheral artery disease) ([place])   . Shortness of breath    Lung MD- Dr Darlin Coco  . Sleep apnea    do not use CPAP every night    Past Surgical History:  Procedure Laterality Date  . ANTERIOR CERVICAL DECOMP/DISCECTOMY FUSION  07/18/2011   Procedure: ANTERIOR CERVICAL DECOMPRESSION/DISCECTOMY FUSION 2 LEVELS;  Surgeon: Cooper Render Pool;  Location: [place] NEURO ORS;  Service: Neurosurgery;  Laterality: N/A;  cervical five-six, cervical six-seven anterior cervical discectomy and fusion  . BACK SURGERY     in [place]     01/2020 Right,  04/2020 Left  . CARDIAC CATHETERIZATION     2005 at [facility], no stents  . CAROTID PTA/STENT INTERVENTION N/A 09/17/2020   Procedure: CAROTID PTA/STENT INTERVENTION;  Surgeon: Algernon Huxley, MD;  Location: [place] CV LAB;  Service: Cardiovascular;  Laterality: N/A;  . CATARACT EXTRACTION W/PHACO Left 01/06/2020   Procedure: CATARACT EXTRACTION PHACO AND INTRAOCULAR LENS PLACEMENT (IOC) ISTENT INJ LEFT 3.81  00:33.3;  Surgeon: Eulogio Bear, MD;  Location: [place];  Service: Ophthalmology;  Laterality: Left;  . CATARACT EXTRACTION W/PHACO Right 02/03/2020   Procedure: CATARACT EXTRACTION PHACO AND INTRAOCULAR LENS PLACEMENT ([place]) RIGHT ISTENT INJ;  Surgeon: Eulogio Bear, MD;  Location: [place];  Service: Ophthalmology;  Laterality: Right;  4.29 0:35.6  . COLONOSCOPY    . HERNIA REPAIR Left    inguinal hernia repair in 1985  . LUMBAR LAMINECTOMY/DECOMPRESSION MICRODISCECTOMY Left 02/24/2014   Procedure: LUMBAR LAMINECTOMY/DECOMPRESSION MICRODISCECTOMY LUMBAR THREE-FOUR, FOUR-FIVE, LEFT FIVE-SACRAL ONE ;  Surgeon: Charlie Pitter, MD;  Location: [place] NEURO ORS;  Service: Neurosurgery;  Laterality: Left;  LUMBAR LAMINECTOMY/DECOMPRESSION MICRODISCECTOMY LUMBAR THREE-FOUR, FOUR-FIVE, LEFT FIVE-SACRAL ONE   . POSTERIOR CERVICAL FUSION/FORAMINOTOMY N/A 08/07/2020   Procedure: C3-6 POSTERIOR FUSION WITH DECOMPRESSION;  Surgeon: Meade Maw, MD;  Location: [facility] ORS;  Service: Neurosurgery;  Laterality: N/A;  .  PROSTATECTOMY  8/14   [facility] Dr Mare Ferrari     There were no vitals filed for this visit.   Subjective Assessment - 10/12/20 1600    Subjective  Pt. reports that he is hoping to go to [facility]  in a couple of weeks for a couple of days.    Pertinent History Pt is a 75 y/o M with hx of cervical myelopathy & admitted on 08/07/20 for scheduled C3-6 Posterior fusion with decompression by Dr. Izora Ribas. PMH: atypical angina, PAD, carotid stenosis, HTN, HLD, COPD,  Myasthenia gravis, hypothyroidism, CKD-III, OSA requiring nocturnal PAP therapy, OA, cervical DDD, lumbosacral spinal stenosis, chronic DOE 2/2 COPD.  Pt went to IP rehab and was discharged on 08/20/2020 with orders for OP OT and PT.  History of carpal tunnel bilaterally.    Patient Stated Goals Pt would like to be able to walk again, and be independent as he was before.    Currently in Pain? No/denies          OT TREATMENT    Neuro muscular re-education:  Pt. worked on grasping, flipping, and turning [place] style discs. Pt. required visual demonstration, and cues for movement patterns. Pt. worked on speed, and coordination skills with the right hand, followed by performing the task simulataneously with the right, and left hands. Pt. worked on grasping 1/2" pegs with circular tips, storing them in his hand, and translatory movements moving them through his hand from his palm to the tip of his 2nd digit, and thumb.   Therapeutic Exercise:  Pt. performed bilateral gross gripping with grip strengthener. Pt. worked on sustaining grip while grasping pegs and reaching at various heights. The gripper was set to 23.4# of grip strength force. Pt. worked on pinch strengthening in the left hand for lateral, and 3pt. pinch using yellow, red, green, and blue resistive clips. Pt. worked on placing the clips at various vertical and horizontal angles. Tactile and verbal cues were required for eliciting the desired movement.  Pt. continues to present with limited Bilateral UE strength, endurance, decreased distal grip, and pinch strength, and impaired motor control, and [facility] skills. Pt. Continues to have difficulty with hand function skills, and translatory movements of his bilateral hands with objects sliding from between his fingers, and from the ulnar aspect of his palms.  Pt. Required cues to avoid using the tabletop to support the disc, as he turns them. Pt. Continues to work on [employer], and  continues to work towards the same treatment plan, and goals in order to work towards improving engagement of his [place] during ADLs, and IADL tasks.                              OT Education - 10/12/20 1602    Education Details ther. ex, ROM, goals    Person(s) Educated Patient    Methods Explanation;Demonstration;Handout;Verbal cues;Tactile cues    Comprehension Verbalized understanding;Returned demonstration;Verbal cues required               OT Long Term Goals - 10/06/20 1113      OT LONG TERM GOAL #1   Title Patient to be independent with home exercise program for strengthening and coordination skills.    Baseline decreased strength and coordination bilaterally after cervical surgery    Time 6    Period Weeks    Status On-going    Target Date 12/29/20      OT LONG  TERM GOAL #2   Title Patient will improve grip strength bilaterally by 8# to hold objects securely without dropping.    Baseline Improving grip strength. Refer to [employer]    Time 12    Period Weeks    Status On-going    Target Date 12/29/20      OT LONG TERM GOAL #3   Title Patient will perform self care tasks with modified independence.    Baseline Independent with morning care    Status Achieved      OT LONG TERM GOAL #4   Title Pt will complete light homemaking tasks with modified independence.    Baseline wife conitnues to assist    Time 12    Period Weeks    Status On-going    Target Date 12/29/20      OT LONG TERM GOAL #5   Title Patient will improve bilateral shoulder ROM and strength to place items on shelves at shoulder height with modified independence.    Baseline Pt. is able to reach items on shelves.    Status Achieved      OT LONG TERM GOAL #6   Title Pt will improve left UE coordination by decreasing time on 9 hole peg test by 10 secs to be able to manipulate and complete buttons with modified independence.    Baseline Pt. continues to have  difficulty.refer to flow sheet for measurements    Time 12    Status On-going    Target Date 12/29/20                 Plan - 10/12/20 1603    Clinical Impression Statement Pt. continues to present with limited Bilateral UE strength, endurance, decreased distal grip, and pinch strength, and impaired motor control, and [facility] skills. Pt. Continues to have difficulty with hand function skills, and translatory movements of his bilateral hands with objects sliding from between his fingers, and from the ulnar aspect of his palms.  Pt. Required cues to avoid using the tabletop to support the disc, as he turns them. Pt. Continues to work on [employer], and continues to work towards the same treatment plan, and goals in order to work towards improving engagement of his [place] during ADLs, and IADL tasks.    OT Occupational Profile and History Detailed Assessment- Review of Records and additional review of physical, cognitive, psychosocial history related to current functional performance    Occupational performance deficits (Please refer to evaluation for details): ADL's;IADL's;Leisure;Work    Marketing executive / Function / Physical Skills ADL;Flexibility;ROM;UE functional use;FMC;Dexterity;Sensation;Strength;IADL;Coordination;Mobility;Decreased knowledge of use of DME;Balance;Endurance    Psychosocial Skills Environmental  Adaptations;Habits;Routines and Behaviors    Rehab Potential Good    Clinical Decision Making Several treatment options, min-mod task modification necessary    Comorbidities Affecting Occupational Performance: Presence of comorbidities impacting occupational performance    Modification or Assistance to Complete Evaluation  No modification of tasks or assist necessary to complete eval    OT Frequency 2x / week    OT Duration 6 weeks    OT Treatment/Interventions Self-care/ADL training;Therapeutic exercise;Neuromuscular education;Patient/family education;Therapeutic  activities;Cryotherapy;Functional Mobility Training;DME and/or AE instruction;Balance training;Moist Heat    Consulted and Agree with Plan of Care Patient           Patient will benefit from skilled therapeutic intervention in order to improve the following deficits and impairments:   Body Structure / Function / Physical Skills: ADL,Flexibility,ROM,UE functional use,FMC,Dexterity,Sensation,Strength,IADL,Coordination,Mobility,Decreased knowledge of use of DME,Balance,Endurance   Psychosocial Skills:  Environmental  Adaptations,Habits,Routines and Behaviors   Visit Diagnosis: Muscle weakness (generalized)    Problem List Patient Active Problem List   Diagnosis Date Noted  . Carotid stenosis, right 09/17/2020  . S/P cervical spinal fusion   . Leukocytosis   . Essential hypertension   . Anemia of chronic disease   . Postoperative pain   . Neuropathic pain   . Cervical myelopathy ([place]) 08/07/2020  . Preop cardiovascular exam 07/17/2020  . SOB (shortness of breath) on exertion 07/17/2020  . Leg weakness, bilateral 07/13/2020  . Aortic atherosclerosis ([place]) 07/09/2020  . Myalgia 10/30/2019  . PAD (peripheral artery disease) ([place]) 06/06/2019  . CKD (chronic kidney disease) stage 3, GFR 30-59 ml/min (HCC) 04/29/2019  . B12 deficiency 01/23/2019  . Left arm numbness 02/28/2018  . Neck pain 02/28/2018  . Anemia 02/02/2017  . DDD (degenerative disc disease), cervical 02/02/2017  . Hypothyroid 02/02/2017  . MRSA (methicillin resistant staph aureus) culture positive 02/02/2017  . Nocturnal hypoxia 02/02/2017  . Senile purpura ([place]) 10/03/2016  . Essential hypertension, benign 09/16/2016  . Bilateral carotid artery disease ([place]) 09/16/2016  . 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/18/2016  . 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 History of prostate cancer 12/21/2015  . Left carpal tunnel syndrome 11/11/2015  . Kidney stone on left side 07/05/2015  . Myasthenia gravis ([place])  05/26/2015  . Long-term use of high-risk medication 10/15/2014  . Persistent cough 09/10/2014  . Pure hypercholesterolemia 07/25/2014  . Spinal stenosis, lumbar region, with neurogenic claudication 02/24/2014  . Lumbosacral stenosis with neurogenic claudication ([place]) 02/24/2014  . COPD (chronic obstructive pulmonary disease) ([place]) 01/22/2014    Harrel Carina, MS, OTR/L 10/12/2020, 4:08 PM  [place] MAIN [facility] SERVICES [address] [place], [place], [address] Phone: [phone]   Fax:  [phone]  Name: TITUS DRONE MRN: [id number] Date of Birth: 1945-09-30

## 2020-10-12 NOTE — Therapy (Signed)
[place] MAIN [facility] SERVICES [address] [place], [place], [address] Phone: [phone]   Fax:  [phone]  Physical Therapy Treatment  Patient Details  Name: Francisco [name] MRN: [id number] Date of Birth: [birth year] Referring Provider (PT): Lauraine Rinne, PA-C   Encounter Date: 10/12/2020   PT End of Session - 10/12/20 5993    Visit Number 7    Number of Visits 25    Date for PT Re-Evaluation 12/29/20    Authorization Type [employer] Medicare    Authorization Time Period 08/25/20-11/17/20    PT Start Time 0233    PT Stop Time 0313    PT Time Calculation (min) 40 min    Equipment Utilized During Treatment Gait belt;Cervical collar    Activity Tolerance Patient tolerated treatment well;No increased pain;Patient limited by fatigue    Behavior During Therapy [facility] for tasks assessed/performed           Past Medical History:  Diagnosis Date  . Arthritis    lower left hip  . Atypical angina ([place])   . Bilateral hand numbness    from back surgery  . Bronchitis, chronic ([place])   . Cancer [facility])    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; Merkel cell cancer, and Basal cell cancer (twice; 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  . Carotid stenosis   . CKD (chronic kidney disease) stage 3, GFR 30-59 ml/min (HCC)   . 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 DDD (degenerative disc disease), cervical   . Hypercholesterolemia   . Hypertension   . Hypothyroidism    pt takes Levothyroxine daily  . Lumbosacral spinal stenosis   . Myasthenia gravis, adult form ([place])   . PAD (peripheral artery disease) ([place])   . Shortness of breath    Lung MD- Dr Darlin Coco  . Sleep apnea    do not use CPAP every night    Past Surgical History:  Procedure Laterality Date  . ANTERIOR CERVICAL DECOMP/DISCECTOMY FUSION  07/18/2011   Procedure: ANTERIOR CERVICAL DECOMPRESSION/DISCECTOMY FUSION 2 LEVELS;  Surgeon: Cooper Render Pool;  Location: [place] NEURO ORS;  Service:  Neurosurgery;  Laterality: N/A;  cervical five-six, cervical six-seven anterior cervical discectomy and fusion  . BACK SURGERY     in [place]     01/2020 Right, 04/2020 Left  . CARDIAC CATHETERIZATION     2005 at [facility], no stents  . CAROTID PTA/STENT INTERVENTION N/A 09/17/2020   Procedure: CAROTID PTA/STENT INTERVENTION;  Surgeon: Algernon Huxley, MD;  Location: [place] CV LAB;  Service: Cardiovascular;  Laterality: N/A;  . CATARACT EXTRACTION W/PHACO Left 01/06/2020   Procedure: CATARACT EXTRACTION PHACO AND INTRAOCULAR LENS PLACEMENT (IOC) ISTENT INJ LEFT 3.81  00:33.3;  Surgeon: Eulogio Bear, MD;  Location: [place];  Service: Ophthalmology;  Laterality: Left;  . CATARACT EXTRACTION W/PHACO Right 02/03/2020   Procedure: CATARACT EXTRACTION PHACO AND INTRAOCULAR LENS PLACEMENT ([place]) RIGHT ISTENT INJ;  Surgeon: Eulogio Bear, MD;  Location: [place];  Service: Ophthalmology;  Laterality: Right;  4.29 0:35.6  . COLONOSCOPY    . HERNIA REPAIR Left    inguinal hernia repair in 1985  . LUMBAR LAMINECTOMY/DECOMPRESSION MICRODISCECTOMY Left 02/24/2014   Procedure: LUMBAR LAMINECTOMY/DECOMPRESSION MICRODISCECTOMY LUMBAR THREE-FOUR, FOUR-FIVE, LEFT FIVE-SACRAL ONE ;  Surgeon: Charlie Pitter, MD;  Location: [place] NEURO ORS;  Service: Neurosurgery;  Laterality: Left;  LUMBAR LAMINECTOMY/DECOMPRESSION MICRODISCECTOMY LUMBAR THREE-FOUR, FOUR-FIVE, LEFT FIVE-SACRAL ONE   .  POSTERIOR CERVICAL FUSION/FORAMINOTOMY N/A 08/07/2020   Procedure: C3-6 POSTERIOR FUSION WITH DECOMPRESSION;  Surgeon: Meade Maw, MD;  Location: [facility] ORS;  Service: Neurosurgery;  Laterality: N/A;  . PROSTATECTOMY  8/14   [facility] Dr Mare Ferrari     There were no vitals filed for this visit.  Pre treatment  Subjective Assessment - 10/12/20 1438    Subjective The patient reports that he continues to have weakness left LE greater than right.    Pertinent  History Yusei Bill is a 45yoM referred to OPPT neuro s/p myelopathy s/p cervical decompression and fusion. Pt sustained several months of general decline in mobility prior to surgical decompression. Pt went to inpatient rehab after hospitalization. [facility].  Pt underwent C3-6 decompression, fusion on 11/26. PMH: myasthenia gravis, 2012 ACDF 5/6, 6/7; OSA, hypothyroidism, HTN, COPD, multiple back surgeries, 2021 carpal tunnel release, referred to OPOT, but only seen for evaluation. 8/10 OPPT evaluation for LSS c radiating pain into the left hip, only completed evaluation session. September - November progressive decline in leg strength and tolerance to upright activity. Also noted a decline in tool manipulation for ADL (pen and meal utensil). Seen by OT and PT as an outpatient for other potential etiology (Carpal tunnel syndrome, and lumbar spinal stenosis), but eventually identified to be related to cervical myelopathy. Several months prior has no limitations in mobility or function. Pt scheduled for Jan 6th: Carotid endarterectomy c Dr. Leotis Pain. Pt has a long history of lumbar DJD, s/p L5/S1 decompression discectomy x2, L5 nerve root injections. Most recent Lumbar MRI (11/10/18) revealin gof advanced formaminal stenosis of L3/4, L4/5, L5/S1. Pt is has had LLE neurological weakness and pain since 2015 and has been followed by neurosurgery.    Limitations Walking;Lifting;Standing;House hold activities    How long can you sit comfortably? Not limited    How long can you stand comfortably? 3-5 minutes    How long can you walk comfortably? 10 minutes    Patient Stated Goals return to functional baseline status of Summer 2021.    Currently in Pain? No/denies    Pain Onset More than a month ago         124/70 HR 72  SEATED   -Sit to stand 3x5 with one hand on bar    -Seated knee extension: 3# L 2x, 4# R 2x12; L LE fatigues quickly and pt lacks eccentric control     Standing: in // bars   Forward marching 3# on left, 4# on right x2 laps single UE support Forward/ backwards ambulation 2x length of // bars.  single UE support Ambulating fwd in bars without UE support 1.5 laps  - 3# on left, 4# on right standing lateral walking in parallel bars, 3x length of // bars, cueing for foot placement- attempted larger steps bilateral UE support   Leg press 40# bilateral x10 (needed assistance to keep feet on)                                        PT Education - 10/12/20 1538    Education provided Yes    Education Details exercise technique, gait    Person(s) Educated Patient    Methods Explanation    Comprehension Verbalized understanding            PT Short Term Goals - 10/06/20 1527      PT SHORT  TERM GOAL #1   Title After 6 weeks patient will be independent in HEP to progress strength, AMB, and balance.    Time 6    Period Weeks    Status New    Target Date 11/17/20      PT SHORT TERM GOAL #2   Title After 6 weeks pt will demonstrate improved 5xSTS hands free chair + airex foam in <20sec    Baseline 01/25: 31s hands free without airex foam    Time 6    Period Weeks    Status New    Target Date 11/17/20             PT Long Term Goals - 10/06/20 1528      PT LONG TERM GOAL #1   Title Patient will reduce timed up and go to <11 seconds to reduce fall risk and demonstrate improved transfer/gait ability.    Baseline 01/25: 30.26s    Time 12    Period Weeks    Status New    Target Date 12/29/20      PT LONG TERM GOAL #2   Title Patient will demonstrate 6MWT (LRAD) >752ft to demonstrate improved capacity to access the community.    Baseline 12/16: 120 ft, 01/25: 165 ft with 1 seated rest break for 2 mins    Time 12    Period Weeks    Status On-going    Target Date 12/29/20      PT LONG TERM GOAL #3   Title Patient will increase 10 meter walk test to >1.22m/s as to improve gait speed for better  community ambulation and to reduce fall risk.    Baseline 01/25: 0.33m/s    Time 12    Period Weeks    Status New    Target Date 12/29/20      PT LONG TERM GOAL #4   Title After 12 weeks pt to demonstrate improved FOTO score >70    Baseline 01/25: 51    Time 12    Period Weeks    Status On-going    Target Date 12/29/20      PT LONG TERM GOAL #5   Title Patient (> 76 years old) will complete five times sit to stand test in < 15 seconds indicating an increased LE strength and improved balance.    Baseline 01/25: 31s    Time 12    Period Weeks    Status New    Target Date 12/29/20                 Plan - 10/12/20 1445    Clinical Impression Statement Patient continues with weakness left greater than right LE.  The patient was able to ambulate in parallel bars 1.5 laps without UE support with shortened stride length.  The patient continues to benefit from additional PT services to improve LE strength and balance for ambulation with LRAD and improved quality of life.    Personal Factors and Comorbidities Age;Comorbidity 2;Past/Current Experience;Time since onset of injury/illness/exacerbation    Comorbidities COPD, History of Cancer, myasthenia gravis, chronic lumbar surgical history.    Examination-Activity Limitations Lift;Squat;Bend;Stairs;Stand;Transfers;Insurance claims handler;Bathing;Hygiene/Grooming;Dressing;Toileting    Examination-Participation Restrictions Yard Work;Church;Cleaning;Driving    Stability/Clinical Decision Making Evolving/Moderate complexity    Rehab Potential Good    Clinical Impairments Affecting Rehab Potential PMH    PT Frequency 2x / week    PT Duration 12 weeks    PT Treatment/Interventions ADLs/Self Care Home Management;Electrical Stimulation;Moist Heat;Traction;Ultrasound;Gait training;Stair training;Functional mobility  training;Therapeutic activities;Therapeutic exercise;Balance training;Neuromuscular re-education;Patient/family  education;Manual techniques;Passive range of motion;Dry needling;Joint Manipulations;Cryotherapy    PT Next Visit Plan balance, strength    PT Home Exercise Plan HEP: seated marches, seated heel raises, seated LAQ    Consulted and Agree with Plan of Care Patient           Patient will benefit from skilled therapeutic intervention in order to improve the following deficits and impairments:  Abnormal gait,Decreased balance,Decreased mobility,Decreased endurance,Difficulty walking,Hypomobility,Impaired sensation,Decreased range of motion,Impaired perceived functional ability,Decreased activity tolerance,Decreased coordination,Decreased strength,Impaired flexibility,Postural dysfunction,Pain  Visit Diagnosis: Muscle weakness (generalized)  Other lack of coordination  Chronic left-sided low back pain with left-sided sciatica  Unsteadiness on feet     Problem List Patient Active Problem List   Diagnosis Date Noted  . Carotid stenosis, right 09/17/2020  . S/P cervical spinal fusion   . Leukocytosis   . Essential hypertension   . Anemia of chronic disease   . Postoperative pain   . Neuropathic pain   . Cervical myelopathy ([place]) 08/07/2020  . Preop cardiovascular exam 07/17/2020  . SOB (shortness of breath) on exertion 07/17/2020  . Leg weakness, bilateral 07/13/2020  . Aortic atherosclerosis ([place]) 07/09/2020  . Myalgia 10/30/2019  . PAD (peripheral artery disease) ([place]) 06/06/2019  . CKD (chronic kidney disease) stage 3, GFR 30-59 ml/min (HCC) 04/29/2019  . B12 deficiency 01/23/2019  . Left arm numbness 02/28/2018  . Neck pain 02/28/2018  . Anemia 02/02/2017  . DDD (degenerative disc disease), cervical 02/02/2017  . Hypothyroid 02/02/2017  . MRSA (methicillin resistant staph aureus) culture positive 02/02/2017  . Nocturnal hypoxia 02/02/2017  . Senile purpura ([place]) 10/03/2016  . Essential hypertension, benign 09/16/2016  . Bilateral carotid artery disease ([place]) 09/16/2016   . 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/18/2016  . 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 History of prostate cancer 12/21/2015  . Left carpal tunnel syndrome 11/11/2015  . Kidney stone on left side 07/05/2015  . Myasthenia gravis ([place]) 05/26/2015  . Long-term use of high-risk medication 10/15/2014  . Persistent cough 09/10/2014  . Pure hypercholesterolemia 07/25/2014  . Spinal stenosis, lumbar region, with neurogenic claudication 02/24/2014  . Lumbosacral stenosis with neurogenic claudication ([place]) 02/24/2014  . COPD (chronic obstructive pulmonary disease) ([place]) 01/22/2014    Hal Morales PT, DPT 10/12/2020, 3:40 PM  [place] MAIN [facility] SERVICES [address] [place], [place], [address] Phone: [phone]   Fax:  [phone]  Name: KASHTYN ENSOR MRN: [id number] Date of Birth: 1945-12-08

## 2020-10-15 ENCOUNTER — Other Ambulatory Visit: Payer: Self-pay

## 2020-10-15 ENCOUNTER — Ambulatory Visit: Payer: Medicare Other | Attending: Internal Medicine

## 2020-10-15 ENCOUNTER — Ambulatory Visit: Payer: Medicare Other | Admitting: Occupational Therapy

## 2020-10-15 DIAGNOSIS — G959 Disease of spinal cord, unspecified: Secondary | ICD-10-CM | POA: Diagnosis present

## 2020-10-15 DIAGNOSIS — R2681 Unsteadiness on feet: Secondary | ICD-10-CM | POA: Insufficient documentation

## 2020-10-15 DIAGNOSIS — R278 Other lack of coordination: Secondary | ICD-10-CM

## 2020-10-15 DIAGNOSIS — M6281 Muscle weakness (generalized): Secondary | ICD-10-CM | POA: Diagnosis present

## 2020-10-15 DIAGNOSIS — M5442 Lumbago with sciatica, left side: Secondary | ICD-10-CM | POA: Diagnosis present

## 2020-10-15 DIAGNOSIS — G8929 Other chronic pain: Secondary | ICD-10-CM | POA: Insufficient documentation

## 2020-10-15 NOTE — Therapy (Signed)
[place] MAIN [facility] SERVICES [address] [place], [place], [address] Phone: [phone]   Fax:  [phone]  Physical Therapy Treatment  Patient Details  Name: Francisco [name] MRN: [id number] Date of Birth: 06-14-1946 Referring Provider (PT): Lauraine Rinne, PA-C   Encounter Date: 10/15/2020   PT End of Session - 10/15/20 1658    Visit Number 8    Number of Visits 25    Date for PT Re-Evaluation 12/29/20    Authorization Type [employer] Medicare    Authorization Time Period 08/25/20-11/17/20    PT Start Time 1548    PT Stop Time 1630    PT Time Calculation (min) 42 min    Equipment Utilized During Treatment Gait belt;Cervical collar    Activity Tolerance Patient tolerated treatment well;No increased pain;Patient limited by fatigue    Behavior During Therapy [facility] for tasks assessed/performed           Past Medical History:  Diagnosis Date  . Arthritis    lower left hip  . Atypical angina ([place])   . Bilateral hand numbness    from back surgery  . Bronchitis, chronic ([place])   . Cancer [facility])    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; Merkel cell cancer, and Basal cell cancer (twice; 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  . Carotid stenosis   . CKD (chronic kidney disease) stage 3, GFR 30-59 ml/min (HCC)   . 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 DDD (degenerative disc disease), cervical   . Hypercholesterolemia   . Hypertension   . Hypothyroidism    pt takes Levothyroxine daily  . Lumbosacral spinal stenosis   . Myasthenia gravis, adult form ([place])   . PAD (peripheral artery disease) ([place])   . Shortness of breath    Lung MD- Dr Darlin Coco  . Sleep apnea    do not use CPAP every night    Past Surgical History:  Procedure Laterality Date  . ANTERIOR CERVICAL DECOMP/DISCECTOMY FUSION  07/18/2011   Procedure: ANTERIOR CERVICAL DECOMPRESSION/DISCECTOMY FUSION 2 LEVELS;  Surgeon: Cooper Render Pool;  Location: [place] NEURO ORS;  Service:  Neurosurgery;  Laterality: N/A;  cervical five-six, cervical six-seven anterior cervical discectomy and fusion  . BACK SURGERY     in [place]     01/2020 Right, 04/2020 Left  . CARDIAC CATHETERIZATION     2005 at [facility], no stents  . CAROTID PTA/STENT INTERVENTION N/A 09/17/2020   Procedure: CAROTID PTA/STENT INTERVENTION;  Surgeon: Algernon Huxley, MD;  Location: [place] CV LAB;  Service: Cardiovascular;  Laterality: N/A;  . CATARACT EXTRACTION W/PHACO Left 01/06/2020   Procedure: CATARACT EXTRACTION PHACO AND INTRAOCULAR LENS PLACEMENT (IOC) ISTENT INJ LEFT 3.81  00:33.3;  Surgeon: Eulogio Bear, MD;  Location: [place];  Service: Ophthalmology;  Laterality: Left;  . CATARACT EXTRACTION W/PHACO Right 02/03/2020   Procedure: CATARACT EXTRACTION PHACO AND INTRAOCULAR LENS PLACEMENT ([place]) RIGHT ISTENT INJ;  Surgeon: Eulogio Bear, MD;  Location: [place];  Service: Ophthalmology;  Laterality: Right;  4.29 0:35.6  . COLONOSCOPY    . HERNIA REPAIR Left    inguinal hernia repair in 1985  . LUMBAR LAMINECTOMY/DECOMPRESSION MICRODISCECTOMY Left 02/24/2014   Procedure: LUMBAR LAMINECTOMY/DECOMPRESSION MICRODISCECTOMY LUMBAR THREE-FOUR, FOUR-FIVE, LEFT FIVE-SACRAL ONE ;  Surgeon: Charlie Pitter, MD;  Location: [place] NEURO ORS;  Service: Neurosurgery;  Laterality: Left;  LUMBAR LAMINECTOMY/DECOMPRESSION MICRODISCECTOMY LUMBAR THREE-FOUR, FOUR-FIVE, LEFT FIVE-SACRAL ONE   .  POSTERIOR CERVICAL FUSION/FORAMINOTOMY N/A 08/07/2020   Procedure: C3-6 POSTERIOR FUSION WITH DECOMPRESSION;  Surgeon: Meade Maw, MD;  Location: [facility] ORS;  Service: Neurosurgery;  Laterality: N/A;  . PROSTATECTOMY  8/14   [facility] Dr Mare Ferrari     There were no vitals filed for this visit.   Subjective Assessment - 10/15/20 1640    Subjective Patient reports his surgeon appointment went well. His BP has been controlled. Can only see PT today not OT due to  conflict of times.    Pertinent History Francisco [name] is a 75yoM referred to OPPT neuro s/p myelopathy s/p cervical decompression and fusion. Pt sustained several months of general decline in mobility prior to surgical decompression. Pt went to inpatient rehab after hospitalization. [facility].  Pt underwent C3-6 decompression, fusion on 11/26. PMH: myasthenia gravis, 2012 ACDF 5/6, 6/7; OSA, hypothyroidism, HTN, COPD, multiple back surgeries, 2021 carpal tunnel release, referred to OPOT, but only seen for evaluation. 8/10 OPPT evaluation for LSS c radiating pain into the left hip, only completed evaluation session. September - November progressive decline in leg strength and tolerance to upright activity. Also noted a decline in tool manipulation for ADL (pen and meal utensil). Seen by OT and PT as an outpatient for other potential etiology (Carpal tunnel syndrome, and lumbar spinal stenosis), but eventually identified to be related to cervical myelopathy. Several months prior has no limitations in mobility or function. Pt scheduled for Jan 6th: Carotid endarterectomy c Dr. Leotis Pain. Pt has a long history of lumbar DJD, s/p L5/S1 decompression discectomy x2, L5 nerve root injections. Most recent Lumbar MRI (11/10/18) revealin gof advanced formaminal stenosis of L3/4, L4/5, L5/S1. Pt is has had LLE neurological weakness and pain since 2015 and has been followed by neurosurgery.    Limitations Walking;Lifting;Standing;House hold activities    How long can you sit comfortably? Not limited    How long can you stand comfortably? 3-5 minutes    How long can you walk comfortably? 10 minutes    Patient Stated Goals return to functional baseline status of Summer 2021.    Currently in Pain? No/denies              Patient reports his surgeon appointment went well. His BP has been controlled. Can only see PT today not OT due to conflict of times.    Watch BP and HR: 146/5 8      Seated:  LAQ with green ball between feet adduction squeeze 10x  Alternating contralateral UE/LE raises 10x; very challenging   Standing: in // bars  orange hurdle: forward step over and back, challenging LLE 10x each LE Orange hurdle; lateral step over and back 10x each side.  Hedgehog taps, SUE support required, 8x each LE three hedgehogs Forward marching, backwards ambulation 6x length of // bars. Cues for hand placement and reduction of trunk extension.  Speed ladder: one foot each square, cues for heel strike 4x length of // bars.    -Airex balance beam- self selected base of support 3 trials x 30 sec, pt intermittently holding hand rail in // bars   bosu ball: modified forward lunge with BUE support; 10x each LE       Pt educated throughout session about proper posture and technique with exercises. Improved exercise technique, movement at target joints, use of target muscles after min to mod verbal, visual, tactile cues   Patient demonstrates excellent motivation throughout physical therapy session. His left limb fatigues quickly requiring  occasional rest breaks due to limited weight shift as patient tires. Control of LLE activation improved with breathing pattern control. . Patient will benefit from skilled physical therapy to reduce fall risk, improve strength and mobility, and return to PLOF                PT Education - 10/15/20 1641    Education provided Yes    Education Details exercise technique, body mechanics.    Person(s) Educated Patient    Methods Explanation;Demonstration;Tactile cues;Verbal cues    Comprehension Verbalized understanding;Returned demonstration;Verbal cues required;Tactile cues required            PT Short Term Goals - 10/06/20 1527      PT SHORT TERM GOAL #1   Title After 6 weeks patient will be independent in HEP to progress strength, AMB, and balance.    Time 6    Period Weeks    Status New    Target Date 11/17/20      PT  SHORT TERM GOAL #2   Title After 6 weeks pt will demonstrate improved 5xSTS hands free chair + airex foam in <20sec    Baseline 01/25: 31s hands free without airex foam    Time 6    Period Weeks    Status New    Target Date 11/17/20             PT Long Term Goals - 10/06/20 1528      PT LONG TERM GOAL #1   Title Patient will reduce timed up and go to <11 seconds to reduce fall risk and demonstrate improved transfer/gait ability.    Baseline 01/25: 30.26s    Time 12    Period Weeks    Status New    Target Date 12/29/20      PT LONG TERM GOAL #2   Title Patient will demonstrate 6MWT (LRAD) >730ft to demonstrate improved capacity to access the community.    Baseline 12/16: 120 ft, 01/25: 165 ft with 1 seated rest break for 2 mins    Time 12    Period Weeks    Status On-going    Target Date 12/29/20      PT LONG TERM GOAL #3   Title Patient will increase 10 meter walk test to >1.26m/s as to improve gait speed for better community ambulation and to reduce fall risk.    Baseline 01/25: 0.48m/s    Time 12    Period Weeks    Status New    Target Date 12/29/20      PT LONG TERM GOAL #4   Title After 12 weeks pt to demonstrate improved FOTO score >70    Baseline 01/25: 51    Time 12    Period Weeks    Status On-going    Target Date 12/29/20      PT LONG TERM GOAL #5   Title Patient (> 30 years old) will complete five times sit to stand test in < 15 seconds indicating an increased LE strength and improved balance.    Baseline 01/25: 31s    Time 12    Period Weeks    Status New    Target Date 12/29/20                 Plan - 10/15/20 1701    Clinical Impression Statement Patient demonstrates excellent motivation throughout physical therapy session. His left limb fatigues quickly requiring occasional rest breaks due to limited weight shift as patient  tires. Control of LLE activation improved with breathing pattern control. . Patient will benefit from skilled physical  therapy to reduce fall risk, improve strength and mobility, and return to PLOF    Personal Factors and Comorbidities Age;Comorbidity 2;Past/Current Experience;Time since onset of injury/illness/exacerbation    Comorbidities COPD, History of Cancer, myasthenia gravis, chronic lumbar surgical history.    Examination-Activity Limitations Lift;Squat;Bend;Stairs;Stand;Transfers;Insurance claims handler;Bathing;Hygiene/Grooming;Dressing;Toileting    Examination-Participation Restrictions Yard Work;Church;Cleaning;Driving    Stability/Clinical Decision Making Evolving/Moderate complexity    Rehab Potential Good    Clinical Impairments Affecting Rehab Potential PMH    PT Frequency 2x / week    PT Duration 12 weeks    PT Treatment/Interventions ADLs/Self Care Home Management;Electrical Stimulation;Moist Heat;Traction;Ultrasound;Gait training;Stair training;Functional mobility training;Therapeutic activities;Therapeutic exercise;Balance training;Neuromuscular re-education;Patient/family education;Manual techniques;Passive range of motion;Dry needling;Joint Manipulations;Cryotherapy    PT Next Visit Plan balance, strength    PT Home Exercise Plan HEP: seated marches, seated heel raises, seated LAQ    Consulted and Agree with Plan of Care Patient           Patient will benefit from skilled therapeutic intervention in order to improve the following deficits and impairments:  Abnormal gait,Decreased balance,Decreased mobility,Decreased endurance,Difficulty walking,Hypomobility,Impaired sensation,Decreased range of motion,Impaired perceived functional ability,Decreased activity tolerance,Decreased coordination,Decreased strength,Impaired flexibility,Postural dysfunction,Pain  Visit Diagnosis: Muscle weakness (generalized)  Other lack of coordination  Chronic left-sided low back pain with left-sided sciatica  Unsteadiness on feet     Problem List Patient Active Problem List    Diagnosis Date Noted  . Carotid stenosis, right 09/17/2020  . S/P cervical spinal fusion   . Leukocytosis   . Essential hypertension   . Anemia of chronic disease   . Postoperative pain   . Neuropathic pain   . Cervical myelopathy ([place]) 08/07/2020  . Preop cardiovascular exam 07/17/2020  . SOB (shortness of breath) on exertion 07/17/2020  . Leg weakness, bilateral 07/13/2020  . Aortic atherosclerosis ([place]) 07/09/2020  . Myalgia 10/30/2019  . PAD (peripheral artery disease) ([place]) 06/06/2019  . CKD (chronic kidney disease) stage 3, GFR 30-59 ml/min (HCC) 04/29/2019  . B12 deficiency 01/23/2019  . Left arm numbness 02/28/2018  . Neck pain 02/28/2018  . Anemia 02/02/2017  . DDD (degenerative disc disease), cervical 02/02/2017  . Hypothyroid 02/02/2017  . MRSA (methicillin resistant staph aureus) culture positive 02/02/2017  . Nocturnal hypoxia 02/02/2017  . Senile purpura ([place]) 10/03/2016  . Essential hypertension, benign 09/16/2016  . Bilateral carotid artery disease ([place]) 09/16/2016  . 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/18/2016  . 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 History of prostate cancer 12/21/2015  . Left carpal tunnel syndrome 11/11/2015  . Kidney stone on left side 07/05/2015  . Myasthenia gravis ([place]) 05/26/2015  . Long-term use of high-risk medication 10/15/2014  . Persistent cough 09/10/2014  . Pure hypercholesterolemia 07/25/2014  . Spinal stenosis, lumbar region, with neurogenic claudication 02/24/2014  . Lumbosacral stenosis with neurogenic claudication ([place]) 02/24/2014  . COPD (chronic obstructive pulmonary disease) ([place]) 01/22/2014   Janna Arch, PT, DPT   10/15/2020, 5:03 PM  [place] MAIN [facility] SERVICES [address] [place], [place], [address] Phone: [phone]   Fax:  [phone]  Name: Francisco [name] MRN: [id number] Date of Birth: 29-Jan-1946

## 2020-10-19 ENCOUNTER — Ambulatory Visit: Payer: Medicare Other | Admitting: Occupational Therapy

## 2020-10-19 ENCOUNTER — Other Ambulatory Visit: Payer: Self-pay

## 2020-10-19 ENCOUNTER — Ambulatory Visit: Payer: Medicare Other | Admitting: Physical Therapy

## 2020-10-19 DIAGNOSIS — R278 Other lack of coordination: Secondary | ICD-10-CM

## 2020-10-19 DIAGNOSIS — M6281 Muscle weakness (generalized): Secondary | ICD-10-CM | POA: Diagnosis not present

## 2020-10-19 DIAGNOSIS — R2681 Unsteadiness on feet: Secondary | ICD-10-CM

## 2020-10-19 DIAGNOSIS — G8929 Other chronic pain: Secondary | ICD-10-CM

## 2020-10-19 DIAGNOSIS — G959 Disease of spinal cord, unspecified: Secondary | ICD-10-CM

## 2020-10-19 NOTE — Therapy (Signed)
[place] MAIN [facility] SERVICES [address] [place], [place], [address] Phone: [phone]   Fax:  [phone]  Physical Therapy Treatment  Patient Details  Name: Francisco [name] MRN: [id number] Date of Birth: February 22, 1946 Referring Provider (PT): Lauraine Rinne, PA-C   Encounter Date: 10/19/2020   PT End of Session - 10/19/20 1535    Visit Number 9    Number of Visits 25    Date for PT Re-Evaluation 12/29/20    Authorization Type [employer] Medicare    Authorization Time Period 08/25/20-11/17/20    PT Start Time 1600    PT Stop Time 1640    PT Time Calculation (min) 40 min    Equipment Utilized During Treatment Gait belt;Cervical collar    Activity Tolerance Patient tolerated treatment well;No increased pain;Patient limited by fatigue    Behavior During Therapy [facility] for tasks assessed/performed           Past Medical History:  Diagnosis Date  . Arthritis    lower left hip  . Atypical angina ([place])   . Bilateral hand numbness    from back surgery  . Bronchitis, chronic ([place])   . Cancer [facility])    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; Merkel cell cancer, and Basal cell cancer (twice; 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  . Carotid stenosis   . CKD (chronic kidney disease) stage 3, GFR 30-59 ml/min (HCC)   . 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 DDD (degenerative disc disease), cervical   . Hypercholesterolemia   . Hypertension   . Hypothyroidism    pt takes Levothyroxine daily  . Lumbosacral spinal stenosis   . Myasthenia gravis, adult form ([place])   . PAD (peripheral artery disease) ([place])   . Shortness of breath    Lung MD- Dr Darlin Coco  . Sleep apnea    do not use CPAP every night    Past Surgical History:  Procedure Laterality Date  . ANTERIOR CERVICAL DECOMP/DISCECTOMY FUSION  07/18/2011   Procedure: ANTERIOR CERVICAL DECOMPRESSION/DISCECTOMY FUSION 2 LEVELS;  Surgeon: Cooper Render Pool;  Location: [place] NEURO ORS;  Service:  Neurosurgery;  Laterality: N/A;  cervical five-six, cervical six-seven anterior cervical discectomy and fusion  . BACK SURGERY     in [place]     01/2020 Right, 04/2020 Left  . CARDIAC CATHETERIZATION     2005 at [facility], no stents  . CAROTID PTA/STENT INTERVENTION N/A 09/17/2020   Procedure: CAROTID PTA/STENT INTERVENTION;  Surgeon: Algernon Huxley, MD;  Location: [place] CV LAB;  Service: Cardiovascular;  Laterality: N/A;  . CATARACT EXTRACTION W/PHACO Left 01/06/2020   Procedure: CATARACT EXTRACTION PHACO AND INTRAOCULAR LENS PLACEMENT (IOC) ISTENT INJ LEFT 3.81  00:33.3;  Surgeon: Eulogio Bear, MD;  Location: [place];  Service: Ophthalmology;  Laterality: Left;  . CATARACT EXTRACTION W/PHACO Right 02/03/2020   Procedure: CATARACT EXTRACTION PHACO AND INTRAOCULAR LENS PLACEMENT ([place]) RIGHT ISTENT INJ;  Surgeon: Eulogio Bear, MD;  Location: [place];  Service: Ophthalmology;  Laterality: Right;  4.29 0:35.6  . COLONOSCOPY    . HERNIA REPAIR Left    inguinal hernia repair in 1985  . LUMBAR LAMINECTOMY/DECOMPRESSION MICRODISCECTOMY Left 02/24/2014   Procedure: LUMBAR LAMINECTOMY/DECOMPRESSION MICRODISCECTOMY LUMBAR THREE-FOUR, FOUR-FIVE, LEFT FIVE-SACRAL ONE ;  Surgeon: Charlie Pitter, MD;  Location: [place] [facility] ORS;  Service: Neurosurgery;  Laterality: Left;  LUMBAR LAMINECTOMY/DECOMPRESSION MICRODISCECTOMY LUMBAR THREE-FOUR, FOUR-FIVE, LEFT FIVE-SACRAL ONE   .  POSTERIOR CERVICAL FUSION/FORAMINOTOMY N/A 08/07/2020   Procedure: C3-6 POSTERIOR FUSION WITH DECOMPRESSION;  Surgeon: Meade Maw, MD;  Location: [facility] ORS;  Service: Neurosurgery;  Laterality: N/A;  . PROSTATECTOMY  8/14   [facility] Dr Mare Ferrari     There were no vitals filed for this visit.   Subjective Assessment - 10/19/20 1734    Subjective Patient reports that he has to keep treatment session shorter today due to having a business meeting. He denies of  any pain or falls since last therapy session.    Pertinent History Horacio Werth is a 75yoM referred to OPPT neuro s/p myelopathy s/p cervical decompression and fusion. Pt sustained several months of general decline in mobility prior to surgical decompression. Pt went to inpatient rehab after hospitalization. [facility].  Pt underwent C3-6 decompression, fusion on 11/26. PMH: myasthenia gravis, 2012 ACDF 5/6, 6/7; OSA, hypothyroidism, HTN, COPD, multiple back surgeries, 2021 carpal tunnel release, referred to OPOT, but only seen for evaluation. 8/10 OPPT evaluation for LSS c radiating pain into the left hip, only completed evaluation session. September - November progressive decline in leg strength and tolerance to upright activity. Also noted a decline in tool manipulation for ADL (pen and meal utensil). Seen by OT and PT as an outpatient for other potential etiology (Carpal tunnel syndrome, and lumbar spinal stenosis), but eventually identified to be related to cervical myelopathy. Several months prior has no limitations in mobility or function. Pt scheduled for Jan 6th: Carotid endarterectomy c Dr. Leotis Pain. Pt has a long history of lumbar DJD, s/p L5/S1 decompression discectomy x2, L5 nerve root injections. Most recent Lumbar MRI (11/10/18) revealin gof advanced formaminal stenosis of L3/4, L4/5, L5/S1. Pt is has had LLE neurological weakness and pain since 2015 and has been followed by neurosurgery.    Limitations Walking;Lifting;Standing;House hold activities    How long can you sit comfortably? Not limited    How long can you stand comfortably? 3-5 minutes    How long can you walk comfortably? 10 minutes    Patient Stated Goals return to functional baseline status of Summer 2021.    Currently in Pain? No/denies         Seated Pre Treatment vital signs: BP: 148/62 HR: 69 SaO2: 96%   Standing hip abduction x BUE support x 2 10 reps each LE  HR: 108, SaO2: 95%  Seated  marches x 30 reps with 2# ankle weights  Seated LAQ x 2 10 reps with 2# ankle weights each LE Seated hamstring curls x GTB x 15 reps each LE Seated ankle DF/PF x 15 reps each LE; LLE more difficult due to limited motion Seated hip abd/add crossovers x 2 10 reps each LE; no weight on LLE, 2# on RLE  Clinical Impression: Patient completed strengthening exercises with fair tolerance to activity. He demonstrates increased muscle fatigue and difficulty completing repetitions on LLE due to significant weakness. Patient's HR was monitored throughout therapy session with elevated readings but was resolved with a few minute seated rest break. Patient will continue to benefit from skilled physical therapy to improve generalized strength, ROM, and capacity for functional activity.            PT Short Term Goals - 10/06/20 1527      PT SHORT TERM GOAL #1   Title After 6 weeks patient will be independent in HEP to progress strength, AMB, and balance.    Time 6    Period Weeks  Status New    Target Date 11/17/20      PT SHORT TERM GOAL #2   Title After 6 weeks pt will demonstrate improved 5xSTS hands free chair + airex foam in <20sec    Baseline 01/25: 31s hands free without airex foam    Time 6    Period Weeks    Status New    Target Date 11/17/20             PT Long Term Goals - 10/06/20 1528      PT LONG TERM GOAL #1   Title Patient will reduce timed up and go to <11 seconds to reduce fall risk and demonstrate improved transfer/gait ability.    Baseline 01/25: 30.26s    Time 12    Period Weeks    Status New    Target Date 12/29/20      PT LONG TERM GOAL #2   Title Patient will demonstrate 6MWT (LRAD) >768ft to demonstrate improved capacity to access the community.    Baseline 12/16: 120 ft, 01/25: 165 ft with 1 seated rest break for 2 mins    Time 12    Period Weeks    Status On-going    Target Date 12/29/20      PT LONG TERM GOAL #3   Title Patient will increase 10  meter walk test to >1.72m/s as to improve gait speed for better community ambulation and to reduce fall risk.    Baseline 01/25: 0.11m/s    Time 12    Period Weeks    Status New    Target Date 12/29/20      PT LONG TERM GOAL #4   Title After 12 weeks pt to demonstrate improved FOTO score >70    Baseline 01/25: 51    Time 12    Period Weeks    Status On-going    Target Date 12/29/20      PT LONG TERM GOAL #5   Title Patient (> 46 years old) will complete five times sit to stand test in < 15 seconds indicating an increased LE strength and improved balance.    Baseline 01/25: 31s    Time 12    Period Weeks    Status New    Target Date 12/29/20                 Plan - 10/19/20 1743    Clinical Impression Statement Patient completed strengthening exercises with fair tolerance to activity. He demonstrates increased muscle fatigue and difficulty completing repetitions on LLE due to significant weakness. Patient's HR was monitored throughout therapy session with elevated readings but was resolved with a few minute seated rest break. Patient will continue to benefit from skilled physical therapy to improve generalized strength, ROM, and capacity for functional activity.    Personal Factors and Comorbidities Age;Comorbidity 2;Past/Current Experience;Time since onset of injury/illness/exacerbation    Comorbidities COPD, History of Cancer, myasthenia gravis, chronic lumbar surgical history.    Examination-Activity Limitations Lift;Squat;Bend;Stairs;Stand;Transfers;Insurance claims handler;Bathing;Hygiene/Grooming;Dressing;Toileting    Examination-Participation Restrictions Yard Work;Church;Cleaning;Driving    Stability/Clinical Decision Making Evolving/Moderate complexity    Rehab Potential Good    Clinical Impairments Affecting Rehab Potential PMH    PT Frequency 2x / week    PT Duration 12 weeks    PT Treatment/Interventions ADLs/Self Care Home Management;Electrical  Stimulation;Moist Heat;Traction;Ultrasound;Gait training;Stair training;Functional mobility training;Therapeutic activities;Therapeutic exercise;Balance training;Neuromuscular re-education;Patient/family education;Manual techniques;Passive range of motion;Dry needling;Joint Manipulations;Cryotherapy    PT Next Visit Plan balance, strength  PT Home Exercise Plan HEP: seated marches, seated heel raises, seated LAQ    Consulted and Agree with Plan of Care Patient           Patient will benefit from skilled therapeutic intervention in order to improve the following deficits and impairments:  Abnormal gait,Decreased balance,Decreased mobility,Decreased endurance,Difficulty walking,Hypomobility,Impaired sensation,Decreased range of motion,Impaired perceived functional ability,Decreased activity tolerance,Decreased coordination,Decreased strength,Impaired flexibility,Postural dysfunction,Pain  Visit Diagnosis: Muscle weakness (generalized)  Other lack of coordination  Unsteadiness on feet  Chronic left-sided low back pain with left-sided sciatica  Cervical myelopathy ([place])     Problem List Patient Active Problem List   Diagnosis Date Noted  . Carotid stenosis, right 09/17/2020  . S/P cervical spinal fusion   . Leukocytosis   . Essential hypertension   . Anemia of chronic disease   . Postoperative pain   . Neuropathic pain   . Cervical myelopathy ([place]) 08/07/2020  . Preop cardiovascular exam 07/17/2020  . SOB (shortness of breath) on exertion 07/17/2020  . Leg weakness, bilateral 07/13/2020  . Aortic atherosclerosis ([place]) 07/09/2020  . Myalgia 10/30/2019  . PAD (peripheral artery disease) ([place]) 06/06/2019  . CKD (chronic kidney disease) stage 3, GFR 30-59 ml/min (HCC) 04/29/2019  . B12 deficiency 01/23/2019  . Left arm numbness 02/28/2018  . Neck pain 02/28/2018  . Anemia 02/02/2017  . DDD (degenerative disc disease), cervical 02/02/2017  . Hypothyroid 02/02/2017  . MRSA  (methicillin resistant staph aureus) culture positive 02/02/2017  . Nocturnal hypoxia 02/02/2017  . Senile purpura ([place]) 10/03/2016  . Essential hypertension, benign 09/16/2016  . Bilateral carotid artery disease ([place]) 09/16/2016  . 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/18/2016  . 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 History of prostate cancer 12/21/2015  . Left carpal tunnel syndrome 11/11/2015  . Kidney stone on left side 07/05/2015  . Myasthenia gravis ([place]) 05/26/2015  . Long-term use of high-risk medication 10/15/2014  . Persistent cough 09/10/2014  . Pure hypercholesterolemia 07/25/2014  . Spinal stenosis, lumbar region, with neurogenic claudication 02/24/2014  . Lumbosacral stenosis with neurogenic claudication ([place]) 02/24/2014  . COPD (chronic obstructive pulmonary disease) ([place]) 01/22/2014   Karl Luke PT, DPT Netta Corrigan 10/19/2020, 5:44 PM  [place] MAIN [facility] SERVICES [address] [place], [place], [address] Phone: [phone]   Fax:  [phone]  Name: Francisco [name] MRN: [id number] Date of Birth: 26-Dec-1945

## 2020-10-21 ENCOUNTER — Other Ambulatory Visit: Payer: Self-pay

## 2020-10-21 ENCOUNTER — Ambulatory Visit: Payer: Medicare Other

## 2020-10-21 ENCOUNTER — Ambulatory Visit: Payer: Medicare Other | Admitting: Occupational Therapy

## 2020-10-21 DIAGNOSIS — R2681 Unsteadiness on feet: Secondary | ICD-10-CM

## 2020-10-21 DIAGNOSIS — M6281 Muscle weakness (generalized): Secondary | ICD-10-CM | POA: Diagnosis not present

## 2020-10-21 DIAGNOSIS — R278 Other lack of coordination: Secondary | ICD-10-CM

## 2020-10-21 NOTE — Therapy (Signed)
[place] MAIN [facility] SERVICES [address] [place], [place], [address] Phone: [phone]   Fax:  [phone]  Physical Therapy Treatment Physical Therapy Progress Note   Dates of reporting period  08/25/20   to   10/21/20  Patient Details  Name: Francisco [name] MRN: [id number] Date of Birth: 1946-06-24 Referring Provider (PT): Francisco Rinne, PA-C   Encounter Date: 10/21/2020   PT End of Session - 10/21/20 1510    Visit Number 10    Number of Visits 25    Date for PT Re-Evaluation 12/29/20    Authorization Type [employer] Medicare    Authorization Time Period 08/25/20-11/17/20    PT Start Time 1516    PT Stop Time 1548    PT Time Calculation (min) 32 min    Equipment Utilized During Treatment Gait belt;Cervical collar    Activity Tolerance Patient tolerated treatment well;No increased [name];Patient limited by fatigue    Behavior During Therapy [facility] for tasks assessed/performed           Past Medical History:  Diagnosis Date  . Arthritis    lower left hip  . Atypical angina ([place])   . Bilateral hand numbness    from back surgery  . Bronchitis, chronic ([place])   . Cancer [facility]    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; Merkel cell cancer, and Basal cell cancer (twice; 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  . Carotid stenosis   . CKD (chronic kidney disease) stage 3, GFR 30-59 ml/min (HCC)   . 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 DDD (degenerative disc disease), cervical   . Hypercholesterolemia   . Hypertension   . Hypothyroidism    pt takes Levothyroxine daily  . Lumbosacral spinal stenosis   . Myasthenia gravis, adult form ([place])   . PAD (peripheral artery disease) ([place])   . Shortness of breath    Lung MD- Dr Francisco [name]  . Sleep apnea    do not use CPAP every night    Past Surgical History:  Procedure Laterality Date  . ANTERIOR CERVICAL DECOMP/DISCECTOMY FUSION  07/18/2011   Procedure: ANTERIOR CERVICAL DECOMPRESSION/DISCECTOMY  FUSION 2 LEVELS;  Surgeon: Francisco [name];  Location: [place] NEURO ORS;  Service: Neurosurgery;  Laterality: N/A;  cervical five-six, cervical six-seven anterior cervical discectomy and fusion  . BACK SURGERY     in [place]     01/2020 Right, 04/2020 Left  . CARDIAC CATHETERIZATION     2005 at [facility], no stents  . CAROTID PTA/STENT INTERVENTION N/A 09/17/2020   Procedure: CAROTID PTA/STENT INTERVENTION;  Surgeon: Francisco Huxley, MD;  Location: [place] CV LAB;  Service: Cardiovascular;  Laterality: N/A;  . CATARACT EXTRACTION W/PHACO Left 01/06/2020   Procedure: CATARACT EXTRACTION PHACO AND INTRAOCULAR LENS PLACEMENT (IOC) ISTENT INJ LEFT 3.81  00:33.3;  Surgeon: Francisco Bear, MD;  Location: [place];  Service: Ophthalmology;  Laterality: Left;  . CATARACT EXTRACTION W/PHACO Right 02/03/2020   Procedure: CATARACT EXTRACTION PHACO AND INTRAOCULAR LENS PLACEMENT ([place]) RIGHT ISTENT INJ;  Surgeon: Francisco Bear, MD;  Location: [place];  Service: Ophthalmology;  Laterality: Right;  4.29 0:35.6  . COLONOSCOPY    . HERNIA REPAIR Left    inguinal hernia repair in 1985  . LUMBAR LAMINECTOMY/DECOMPRESSION MICRODISCECTOMY Left 02/24/2014   Procedure: LUMBAR LAMINECTOMY/DECOMPRESSION MICRODISCECTOMY LUMBAR THREE-FOUR, FOUR-FIVE, LEFT FIVE-SACRAL ONE ;  Surgeon: Francisco Pitter, MD;  Location: [place]  ORS;  Service: Neurosurgery;  Laterality: Left;  LUMBAR LAMINECTOMY/DECOMPRESSION MICRODISCECTOMY LUMBAR THREE-FOUR, FOUR-FIVE, LEFT FIVE-SACRAL ONE   . POSTERIOR CERVICAL FUSION/FORAMINOTOMY N/A 08/07/2020   Procedure: C3-6 POSTERIOR FUSION WITH DECOMPRESSION;  Surgeon: Francisco Maw, MD;  Location: [facility] ORS;  Service: Neurosurgery;  Laterality: N/A;  . PROSTATECTOMY  8/14   [facility] Dr Francisco [name]     There were no vitals filed for this visit.   Subjective Assessment - 10/21/20 1601    Subjective Patient reports his legs feel weaker  today than normal but he has been compliant with his walking and HEP daily.    Pertinent History Francisco [name] is a 30yoM referred to OPPT neuro s/p myelopathy s/p cervical decompression and fusion. Pt sustained several months of general decline in mobility prior to surgical decompression. Pt went to inpatient rehab after hospitalization. [facility].  Pt underwent C3-6 decompression, fusion on 11/26. PMH: myasthenia gravis, 2012 ACDF 5/6, 6/7; OSA, hypothyroidism, HTN, COPD, multiple back surgeries, 2021 carpal tunnel release, referred to OPOT, but only seen for evaluation. 8/10 OPPT evaluation for LSS c radiating [name] into the left hip, only completed evaluation session. September - November progressive decline in leg strength and tolerance to upright activity. Also noted a decline in tool manipulation for ADL (pen and meal utensil). Seen by OT and PT as an outpatient for other potential etiology (Carpal tunnel syndrome, and lumbar spinal stenosis), but eventually identified to be related to cervical myelopathy. Several months prior has no limitations in mobility or function. Pt scheduled for Jan 6th: Carotid endarterectomy c Dr. Leotis [name]. Pt has a long history of lumbar DJD, s/p L5/S1 decompression discectomy x2, L5 nerve root injections. Most recent Lumbar MRI (11/10/18) revealin gof advanced formaminal stenosis of L3/4, L4/5, L5/S1. Pt is has had LLE neurological weakness and [name] since 2015 and has been followed by neurosurgery.    Limitations Walking;Lifting;Standing;House hold activities    How long can you sit comfortably? Not limited    How long can you stand comfortably? 3-5 minutes    How long can you walk comfortably? 10 minutes    Patient Stated Goals return to functional baseline status of Summer 2021.    Currently in [name]? No/denies             Patient reports his legs are feeling very weak this afternoon     Progress note:   Vitals at start of  session:142/77  5x STS with airex pad TUG: 19.55 seconds with RW  6 MWT-395 ft with RW, one seated rest break.  FOTO= 55.97  10 MWT: 12.63 =0.81 m/s  5x STS=15.47 seconds 13.09 seconds second trial (MET) hands on knees.       Patient's condition has the potential to improve in response to therapy. Maximum improvement is yet to be obtained. The anticipated improvement is attainable and reasonable in a generally predictable time.  Patient reports he is getting more independent, he is limited with walking the most.    Patient demonstrates excellent progress towards functional goals at this time. He is able to ambulate for longer durations however continues to be limtied by fatigue and L hip [name]/drag. Session ended early due to excessive fatigue. Patient's condition has the potential to improve in response to therapy.  Maximum improvement is yet to be obtained. The anticipated improvement is attainable and reasonable in a generally predictable time.Patient will continue to benefit from skilled physical therapy to improve generalized strength, ROM, and capacity for functional activity.  PT Education - 10/21/20 1508    Education provided Yes    Education Details exercise technique, body mechanics    Person(s) Educated Patient    Methods Explanation;Demonstration;Tactile cues;Verbal cues    Comprehension Verbalized understanding;Returned demonstration;Verbal cues required;Tactile cues required            PT Short Term Goals - 10/21/20 1534      PT SHORT TERM GOAL #1   Title After 6 weeks patient will be independent in HEP to progress strength, AMB, and balance.    Baseline 2/9: HEP compliant    Time 6    Period Weeks    Status Partially Met    Target Date 11/17/20      PT SHORT TERM GOAL #2   Title After 6 weeks pt will demonstrate improved 5xSTS hands free chair + airex foam in <20sec    Baseline 01/25: 31s hands free without airex foam 2/9: unable to do on  airex    Time 6    Period Weeks    Status On-going    Target Date 11/17/20             PT Long Term Goals - 10/21/20 1532      PT LONG TERM GOAL #1   Title Patient will reduce timed up and go to <11 seconds to reduce fall risk and demonstrate improved transfer/gait ability.    Baseline 01/25: 30.26s 2/9: 19.55 seconds with RW    Time 12    Period Weeks    Status Partially Met    Target Date 12/29/20      PT LONG TERM GOAL #2   Title Patient will demonstrate 6MWT (LRAD) >761f to demonstrate improved capacity to access the community.    Baseline 12/16: 120 ft, 01/25: 165 ft with 1 seated rest break for 2 mins 2/9: 395 ft with RW and one seated rest break    Time 12    Period Weeks    Status Partially Met    Target Date 12/29/20      PT LONG TERM GOAL #3   Title Patient will increase 10 meter walk test to >1.075m as to improve gait speed for better community ambulation and to reduce fall risk.    Baseline 01/25: 0.3868m2/9: 0.81 m/s with RW    Time 12    Period Weeks    Status Partially Met    Target Date 12/29/20      PT LONG TERM GOAL #4   Title After 12 weeks pt to demonstrate improved FOTO score >70    Baseline 01/25: 51 2/9: 55.97 %    Time 12    Period Weeks    Status Partially Met    Target Date 12/29/20      PT LONG TERM GOAL #5   Title Patient (> 60 43ars old) will complete five times sit to stand test in < 15 seconds indicating an increased LE strength and improved balance.    Baseline 01/25: 31s 2/9: hands on knees 13.09    Time 12    Period Weeks    Status Achieved                 Plan - 10/21/20 1745    Clinical Impression Statement Patient demonstrates excellent progress towards functional goals at this time. He is able to ambulate for longer durations however continues to be limtied by fatigue and L hip [name]/drag. Session ended early due to excessive fatigue. Patient's condition has  the potential to improve in response to therapy.  Maximum  improvement is yet to be obtained. The anticipated improvement is attainable and reasonable in a generally predictable time.Patient will continue to benefit from skilled physical therapy to improve generalized strength, ROM, and capacity for functional activity.    Personal Factors and Comorbidities Age;Comorbidity 2;Past/Current Experience;Time since onset of injury/illness/exacerbation    Comorbidities COPD, History of Cancer, myasthenia gravis, chronic lumbar surgical history.    Examination-Activity Limitations Lift;Squat;Bend;Stairs;Stand;Transfers;Insurance claims handler;Bathing;Hygiene/Grooming;Dressing;Toileting    Examination-Participation Restrictions Yard Work;Church;Cleaning;Driving    Stability/Clinical Decision Making Evolving/Moderate complexity    Rehab Potential Good    Clinical Impairments Affecting Rehab Potential PMH    PT Frequency 2x / week    PT Duration 12 weeks    PT Treatment/Interventions ADLs/Self Care Home Management;Electrical Stimulation;Moist Heat;Traction;Ultrasound;Gait training;Stair training;Functional mobility training;Therapeutic activities;Therapeutic exercise;Balance training;Neuromuscular re-education;Patient/family education;Manual techniques;Passive range of motion;Dry needling;Joint Manipulations;Cryotherapy    PT Next Visit Plan balance, strength    PT Home Exercise Plan HEP: seated marches, seated heel raises, seated LAQ    Consulted and Agree with Plan of Care Patient           Patient will benefit from skilled therapeutic intervention in order to improve the following deficits and impairments:  Abnormal gait,Decreased balance,Decreased mobility,Decreased endurance,Difficulty walking,Hypomobility,Impaired sensation,Decreased range of motion,Impaired perceived functional ability,Decreased activity tolerance,Decreased coordination,Decreased strength,Impaired flexibility,Postural dysfunction,[name]  Visit Diagnosis: Muscle weakness  (generalized)  Other lack of coordination  Unsteadiness on feet     Problem List Patient Active Problem List   Diagnosis Date Noted  . Carotid stenosis, right 09/17/2020  . S/P cervical spinal fusion   . Leukocytosis   . Essential hypertension   . Anemia of chronic disease   . Postoperative [name]   . Neuropathic [name]   . Cervical myelopathy ([place]) 08/07/2020  . Preop cardiovascular exam 07/17/2020  . SOB (shortness of breath) on exertion 07/17/2020  . Leg weakness, bilateral 07/13/2020  . Aortic atherosclerosis ([place]) 07/09/2020  . Myalgia 10/30/2019  . PAD (peripheral artery disease) ([place]) 06/06/2019  . CKD (chronic kidney disease) stage 3, GFR 30-59 ml/min (HCC) 04/29/2019  . B12 deficiency 01/23/2019  . Left arm numbness 02/28/2018  . Neck [name] 02/28/2018  . Anemia 02/02/2017  . DDD (degenerative disc disease), cervical 02/02/2017  . Hypothyroid 02/02/2017  . MRSA (methicillin resistant staph aureus) culture positive 02/02/2017  . Nocturnal hypoxia 02/02/2017  . Senile purpura ([place]) 10/03/2016  . Essential hypertension, benign 09/16/2016  . Bilateral carotid artery disease ([place]) 09/16/2016  . 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/18/2016  . 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 History of prostate cancer 12/21/2015  . Left carpal tunnel syndrome 11/11/2015  . Kidney stone on left side 07/05/2015  . Myasthenia gravis ([place]) 05/26/2015  . Long-term use of high-risk medication 10/15/2014  . Persistent cough 09/10/2014  . Pure hypercholesterolemia 07/25/2014  . Spinal stenosis, lumbar region, with neurogenic claudication 02/24/2014  . Lumbosacral stenosis with neurogenic claudication ([place]) 02/24/2014  . COPD (chronic obstructive pulmonary disease) ([place]) 01/22/2014   Janna Arch, PT, DPT   10/21/2020, 5:46 PM  [place] MAIN [facility] SERVICES [address] [place], [place], [address] Phone: [phone]   Fax:   [phone]  Name: Francisco [name] MRN: [id number] Date of Birth: 01/27/1946

## 2020-10-24 ENCOUNTER — Encounter: Payer: Self-pay | Admitting: Occupational Therapy

## 2020-10-24 NOTE — Therapy (Signed)
[place] MAIN [facility] SERVICES [address] [place], [place], [address] Phone: [phone]   Fax:  [phone]  Occupational Therapy Treatment  Patient Details  Name: Francisco [name] MRN: [id number] Date of Birth: [birth year] Referring Provider (OT): Dr Annette Stable   Encounter Date: 10/19/2020   OT End of Session - 10/24/20 2002    Visit Number 5    Number of Visits 12    Date for OT Re-Evaluation 12/29/20    OT Start Time 1515    OT Stop Time 1600    OT Time Calculation (min) 45 min    Activity Tolerance Patient tolerated treatment well    Behavior During Therapy [facility] for tasks assessed/performed           Past Medical History:  Diagnosis Date  . Arthritis    lower left hip  . Atypical angina ([place])   . Bilateral hand numbness    from back surgery  . Bronchitis, chronic ([place])   . Cancer [facility])    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; Merkel cell cancer, and Basal cell cancer (twice; 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  . Carotid stenosis   . CKD (chronic kidney disease) stage 3, GFR 30-59 ml/min (HCC)   . 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 DDD (degenerative disc disease), cervical   . Hypercholesterolemia   . Hypertension   . Hypothyroidism    pt takes Levothyroxine daily  . Lumbosacral spinal stenosis   . Myasthenia gravis, adult form ([place])   . PAD (peripheral artery disease) ([place])   . Shortness of breath    Lung MD- Dr Darlin Coco  . Sleep apnea    do not use CPAP every night    Past Surgical History:  Procedure Laterality Date  . ANTERIOR CERVICAL DECOMP/DISCECTOMY FUSION  07/18/2011   Procedure: ANTERIOR CERVICAL DECOMPRESSION/DISCECTOMY FUSION 2 LEVELS;  Surgeon: Cooper Render Pool;  Location: [place] NEURO ORS;  Service: Neurosurgery;  Laterality: N/A;  cervical five-six, cervical six-seven anterior cervical discectomy and fusion  . BACK SURGERY     in [place]     01/2020 Right,  04/2020 Left  . CARDIAC CATHETERIZATION     2005 at [facility], no stents  . CAROTID PTA/STENT INTERVENTION N/A 09/17/2020   Procedure: CAROTID PTA/STENT INTERVENTION;  Surgeon: Algernon Huxley, MD;  Location: [place] CV LAB;  Service: Cardiovascular;  Laterality: N/A;  . CATARACT EXTRACTION W/PHACO Left 01/06/2020   Procedure: CATARACT EXTRACTION PHACO AND INTRAOCULAR LENS PLACEMENT (IOC) ISTENT INJ LEFT 3.81  00:33.3;  Surgeon: Eulogio Bear, MD;  Location: [place];  Service: Ophthalmology;  Laterality: Left;  . CATARACT EXTRACTION W/PHACO Right 02/03/2020   Procedure: CATARACT EXTRACTION PHACO AND INTRAOCULAR LENS PLACEMENT ([place]) RIGHT ISTENT INJ;  Surgeon: Eulogio Bear, MD;  Location: [place];  Service: Ophthalmology;  Laterality: Right;  4.29 0:35.6  . COLONOSCOPY    . HERNIA REPAIR Left    inguinal hernia repair in 1985  . LUMBAR LAMINECTOMY/DECOMPRESSION MICRODISCECTOMY Left 02/24/2014   Procedure: LUMBAR LAMINECTOMY/DECOMPRESSION MICRODISCECTOMY LUMBAR THREE-FOUR, FOUR-FIVE, LEFT FIVE-SACRAL ONE ;  Surgeon: Charlie Pitter, MD;  Location: [place] NEURO ORS;  Service: Neurosurgery;  Laterality: Left;  LUMBAR LAMINECTOMY/DECOMPRESSION MICRODISCECTOMY LUMBAR THREE-FOUR, FOUR-FIVE, LEFT FIVE-SACRAL ONE   . POSTERIOR CERVICAL FUSION/FORAMINOTOMY N/A 08/07/2020   Procedure: C3-6 POSTERIOR FUSION WITH DECOMPRESSION;  Surgeon: Meade Maw, MD;  Location: [facility] ORS;  Service: Neurosurgery;  Laterality: N/A;  .  PROSTATECTOMY  8/14   [facility] Dr Mare Ferrari     There were no vitals filed for this visit.   Subjective Assessment - 10/24/20 2001    Subjective  Pt reports he is doing well, feels his hand skills have improved and really needs PT more.    Pertinent History Pt is a 75 y/o M with hx of cervical myelopathy & admitted on 08/07/20 for scheduled C3-6 Posterior fusion with decompression by Dr. Izora Ribas. PMH: atypical angina, PAD, carotid stenosis, HTN, HLD, COPD,  Myasthenia gravis, hypothyroidism, CKD-III, OSA requiring nocturnal PAP therapy, OA, cervical DDD, lumbosacral spinal stenosis, chronic DOE 2/2 COPD.  Pt went to IP rehab and was discharged on 08/20/2020 with orders for OP OT and PT.  History of carpal tunnel bilaterally.    Patient Stated Goals Pt would like to be able to walk again, and be independent as he was before.    Currently in Pain? No/denies    Pain Score 0-No pain          ADL/work related skills Pt seen for focus on typing skills required for work related skills.  Pt engaging in multiple typing tests, 3 and 5 mins drill for multiple trials.  WPM varied from 7 to 11 WPM.  Pt reports he is able to work from home and can take his time with typing skills and completing emails.  He reports he reviews all emails prior to sending to proofread for errors.  Able to identify and correct mistakes most of the time while performing task.   Neuromuscular reeducation: Fine motor coordination skills with manipulation of small objects from seated position with emphasis on translatory skills of the hand to move items from finger tips to palm, then using hand for storage and moving items to fingertip to place into a container.  Increased dropping of items with translatory skills of the hand and dropping items frequently from palm area depending on size of item.  The smaller the item, the more difficulty with manipulation skills. Requires cues for prehension skills and occasional therapist demonstration for proper form and technique.     Response to tx:   Pt continues to progress in all areas.  He does have some residual numbness in his hands which likely limits his manipulation skills and precision of tasks.  Demonstrates difficulty with manipulation of items smaller than 1/2 inch in size and with translatory skills of the hand and using the hand for storage.  Typing tests with best score of 11 WPM.  Will continue to work towards skills to improve bilateral  hand use for necessary daily tasks at home, work and in the community.                      OT Education - 10/24/20 2002    Education Details fine motor coordination skills, typing skills    Person(s) Educated Patient    Methods Explanation;Demonstration;Handout;Verbal cues;Tactile cues    Comprehension Verbalized understanding;Returned demonstration;Verbal cues required               OT Long Term Goals - 10/06/20 1113      OT LONG TERM GOAL #1   Title Patient to be independent with home exercise program for strengthening and coordination skills.    Baseline decreased strength and coordination bilaterally after cervical surgery    Time 6    Period Weeks    Status On-going    Target Date 12/29/20      OT  LONG TERM GOAL #2   Title Patient will improve grip strength bilaterally by 8# to hold objects securely without dropping.    Baseline Improving grip strength. Refer to [employer]    Time 12    Period Weeks    Status On-going    Target Date 12/29/20      OT LONG TERM GOAL #3   Title Patient will perform self care tasks with modified independence.    Baseline Independent with morning care    Status Achieved      OT LONG TERM GOAL #4   Title Pt will complete light homemaking tasks with modified independence.    Baseline wife conitnues to assist    Time 12    Period Weeks    Status On-going    Target Date 12/29/20      OT LONG TERM GOAL #5   Title Patient will improve bilateral shoulder ROM and strength to place items on shelves at shoulder height with modified independence.    Baseline Pt. is able to reach items on shelves.    Status Achieved      OT LONG TERM GOAL #6   Title Pt will improve left UE coordination by decreasing time on 9 hole peg test by 10 secs to be able to manipulate and complete buttons with modified independence.    Baseline Pt. continues to have difficulty.refer to flow sheet for measurements    Time 12    Status On-going     Target Date 12/29/20                 Plan - 10/24/20 2002    Clinical Impression Statement Pt continues to progress in all areas.  He does have some residual numbness in his hands which likely limits his manipulation skills and precision of tasks.  Demonstrates difficulty with manipulation of items smaller than 1/2 inch in size and with translatory skills of the hand and using the hand for storage.  Typing tests with best score of 11 WPM.  Will continue to work towards skills to improve bilateral hand use for necessary daily tasks at home, work and in the community.    OT Occupational Profile and History Detailed Assessment- Review of Records and additional review of physical, cognitive, psychosocial history related to current functional performance    Occupational performance deficits (Please refer to evaluation for details): ADL's;IADL's;Leisure;Work    Marketing executive / Function / Physical Skills ADL;Flexibility;ROM;UE functional use;FMC;Dexterity;Sensation;Strength;IADL;Coordination;Mobility;Decreased knowledge of use of DME;Balance;Endurance    Psychosocial Skills Environmental  Adaptations;Habits;Routines and Behaviors    Rehab Potential Good    Clinical Decision Making Several treatment options, min-mod task modification necessary    Comorbidities Affecting Occupational Performance: Presence of comorbidities impacting occupational performance    Modification or Assistance to Complete Evaluation  No modification of tasks or assist necessary to complete eval    OT Frequency 2x / week    OT Duration 6 weeks    OT Treatment/Interventions Self-care/ADL training;Therapeutic exercise;Neuromuscular education;Patient/family education;Therapeutic activities;Cryotherapy;Functional Mobility Training;DME and/or AE instruction;Balance training;Moist Heat    Consulted and Agree with Plan of Care Patient           Patient will benefit from skilled therapeutic intervention in order to improve  the following deficits and impairments:   Body Structure / Function / Physical Skills: ADL,Flexibility,ROM,UE functional use,FMC,Dexterity,Sensation,Strength,IADL,Coordination,Mobility,Decreased knowledge of use of DME,Balance,Endurance   Psychosocial Skills: Environmental  Adaptations,Habits,Routines and Behaviors   Visit Diagnosis: Other lack of coordination  Muscle weakness (generalized)  Unsteadiness on feet    Problem List Patient Active Problem List   Diagnosis Date Noted  . Carotid stenosis, right 09/17/2020  . S/P cervical spinal fusion   . Leukocytosis   . Essential hypertension   . Anemia of chronic disease   . Postoperative pain   . Neuropathic pain   . Cervical myelopathy ([place]) 08/07/2020  . Preop cardiovascular exam 07/17/2020  . SOB (shortness of breath) on exertion 07/17/2020  . Leg weakness, bilateral 07/13/2020  . Aortic atherosclerosis ([place]) 07/09/2020  . Myalgia 10/30/2019  . PAD (peripheral artery disease) ([place]) 06/06/2019  . CKD (chronic kidney disease) stage 3, GFR 30-59 ml/min (HCC) 04/29/2019  . B12 deficiency 01/23/2019  . Left arm numbness 02/28/2018  . Neck pain 02/28/2018  . Anemia 02/02/2017  . DDD (degenerative disc disease), cervical 02/02/2017  . Hypothyroid 02/02/2017  . MRSA (methicillin resistant staph aureus) culture positive 02/02/2017  . Nocturnal hypoxia 02/02/2017  . Senile purpura ([place]) 10/03/2016  . Essential hypertension, benign 09/16/2016  . Bilateral carotid artery disease ([place]) 09/16/2016  . 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/18/2016  . 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 History of prostate cancer 12/21/2015  . Left carpal tunnel syndrome 11/11/2015  . Kidney stone on left side 07/05/2015  . Myasthenia gravis ([place]) 05/26/2015  . Long-term use of high-risk medication 10/15/2014  . Persistent cough 09/10/2014  . Pure hypercholesterolemia 07/25/2014  . Spinal stenosis, lumbar region, with neurogenic claudication  02/24/2014  . Lumbosacral stenosis with neurogenic claudication ([place]) 02/24/2014  . COPD (chronic obstructive pulmonary disease) ([place]) 01/22/2014   Hortence Charter T Tomasita Morrow, OTR/L, CLT  Jenina Moening 10/24/2020, 8:12 PM  [place] MAIN [facility] SERVICES [address] [place], [place], [address] Phone: [phone]   Fax:  [phone]  Name: Francisco [name] MRN: [id number] Date of Birth: 1946/06/18

## 2020-10-24 NOTE — Therapy (Signed)
[place] MAIN [facility] SERVICES [address] [place], [place], [address] Phone: [phone]   Fax:  [phone]  Occupational Therapy Treatment  Patient Details  Name: Francisco [name] MRN: [id number] Date of Birth: [birth year] Referring Provider (OT): Dr Annette Stable   Encounter Date: 10/21/2020   OT End of Session - 10/23/20 2117    Visit Number 6    Number of Visits 12    Date for OT Re-Evaluation 12/29/20    OT Start Time 1430    OT Stop Time 1515    OT Time Calculation (min) 45 min    Activity Tolerance Patient tolerated treatment well    Behavior During Therapy [facility] for tasks assessed/performed           Past Medical History:  Diagnosis Date  . Arthritis    lower left hip  . Atypical angina ([place])   . Bilateral hand numbness    from back surgery  . Bronchitis, chronic ([place])   . Cancer [facility])    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; Merkel cell cancer, and Basal cell cancer (twice; 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  . Carotid stenosis   . CKD (chronic kidney disease) stage 3, GFR 30-59 ml/min (HCC)   . 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 DDD (degenerative disc disease), cervical   . Hypercholesterolemia   . Hypertension   . Hypothyroidism    pt takes Levothyroxine daily  . Lumbosacral spinal stenosis   . Myasthenia gravis, adult form ([place])   . PAD (peripheral artery disease) ([place])   . Shortness of breath    Lung MD- Dr Darlin Coco  . Sleep apnea    do not use CPAP every night    Past Surgical History:  Procedure Laterality Date  . ANTERIOR CERVICAL DECOMP/DISCECTOMY FUSION  07/18/2011   Procedure: ANTERIOR CERVICAL DECOMPRESSION/DISCECTOMY FUSION 2 LEVELS;  Surgeon: Cooper Render Pool;  Location: [place] NEURO ORS;  Service: Neurosurgery;  Laterality: N/A;  cervical five-six, cervical six-seven anterior cervical discectomy and fusion  . BACK SURGERY     in [place]     01/2020 Right,  04/2020 Left  . CARDIAC CATHETERIZATION     2005 at [facility], no stents  . CAROTID PTA/STENT INTERVENTION N/A 09/17/2020   Procedure: CAROTID PTA/STENT INTERVENTION;  Surgeon: Algernon Huxley, MD;  Location: [place] CV LAB;  Service: Cardiovascular;  Laterality: N/A;  . CATARACT EXTRACTION W/PHACO Left 01/06/2020   Procedure: CATARACT EXTRACTION PHACO AND INTRAOCULAR LENS PLACEMENT (IOC) ISTENT INJ LEFT 3.81  00:33.3;  Surgeon: Eulogio Bear, MD;  Location: [place];  Service: Ophthalmology;  Laterality: Left;  . CATARACT EXTRACTION W/PHACO Right 02/03/2020   Procedure: CATARACT EXTRACTION PHACO AND INTRAOCULAR LENS PLACEMENT ([place]) RIGHT ISTENT INJ;  Surgeon: Eulogio Bear, MD;  Location: [place];  Service: Ophthalmology;  Laterality: Right;  4.29 0:35.6  . COLONOSCOPY    . HERNIA REPAIR Left    inguinal hernia repair in 1985  . LUMBAR LAMINECTOMY/DECOMPRESSION MICRODISCECTOMY Left 02/24/2014   Procedure: LUMBAR LAMINECTOMY/DECOMPRESSION MICRODISCECTOMY LUMBAR THREE-FOUR, FOUR-FIVE, LEFT FIVE-SACRAL ONE ;  Surgeon: Charlie Pitter, MD;  Location: [place] NEURO ORS;  Service: Neurosurgery;  Laterality: Left;  LUMBAR LAMINECTOMY/DECOMPRESSION MICRODISCECTOMY LUMBAR THREE-FOUR, FOUR-FIVE, LEFT FIVE-SACRAL ONE   . POSTERIOR CERVICAL FUSION/FORAMINOTOMY N/A 08/07/2020   Procedure: C3-6 POSTERIOR FUSION WITH DECOMPRESSION;  Surgeon: Meade Maw, MD;  Location: [facility] ORS;  Service: Neurosurgery;  Laterality: N/A;  .  PROSTATECTOMY  8/14   [facility] Dr Mare Ferrari     There were no vitals filed for this visit.   Subjective Assessment - 10/24/20 2117    Subjective  No complaints    Pertinent History Pt is a 75 y/o M with hx of cervical myelopathy & admitted on 08/07/20 for scheduled C3-6 Posterior fusion with decompression by Dr. Izora Ribas. PMH: atypical angina, PAD, carotid stenosis, HTN, HLD, COPD, Myasthenia gravis, hypothyroidism, CKD-III, OSA requiring nocturnal PAP therapy,  OA, cervical DDD, lumbosacral spinal stenosis, chronic DOE 2/2 COPD.  Pt went to IP rehab and was discharged on 08/20/2020 with orders for OP OT and PT.  History of carpal tunnel bilaterally.    Patient Stated Goals Pt would like to be able to walk again, and be independent as he was before.    Currently in Pain? No/denies    Pain Score 0-No pain           Therapeutic Exercise:  Patient seen for finger strengthening tasks with use of moderate resistive bulletin board to place small pressure hands into board with cues for prehension patterns.  Neuromuscular reeducation: Manipulation of Glass beads up to 7 at a time in hand using hand for storage and translatory skills of the hand on the left with cues. Improved speed and dexterity today with this task. Patient demonstrates some difficulty with graded pressure with decreased sensation of the hand when attempting to pick up and manage items. Patient seen for medication management using a tray or container lid with lip to pour them out so it will contain the pills and not scatter. Manipulation of small pieces of Jamar tweezer task left hand use with cues.   Response to treatment: Patient able to perform finger strengthening task however moderate resistive bulletin board was challenging to patient this date.  Patient continues to work towards manipulation of small items from 1 inch down to 1/2 inch in size.  Continued difficulty with translatory skills of the hand and using the hand for storage.  Some difficulty at times with graded pressure which may be due to decreased sensation of the hand.  Continue to work towards goals and plan of care to improve functional use of bilateral upper extremities to improve independence and necessary daily tasks.                    OT Education - 10/24/20 2117    Education Details manipulation skills    Person(s) Educated Patient    Methods Explanation;Demonstration;Handout;Verbal cues;Tactile  cues    Comprehension Verbalized understanding;Returned demonstration;Verbal cues required               OT Long Term Goals - 10/06/20 1113      OT LONG TERM GOAL #1   Title Patient to be independent with home exercise program for strengthening and coordination skills.    Baseline decreased strength and coordination bilaterally after cervical surgery    Time 6    Period Weeks    Status On-going    Target Date 12/29/20      OT LONG TERM GOAL #2   Title Patient will improve grip strength bilaterally by 8# to hold objects securely without dropping.    Baseline Improving grip strength. Refer to [employer]    Time 12    Period Weeks    Status On-going    Target Date 12/29/20      OT LONG TERM GOAL #3   Title Patient will perform self  care tasks with modified independence.    Baseline Independent with morning care    Status Achieved      OT LONG TERM GOAL #4   Title Pt will complete light homemaking tasks with modified independence.    Baseline wife conitnues to assist    Time 12    Period Weeks    Status On-going    Target Date 12/29/20      OT LONG TERM GOAL #5   Title Patient will improve bilateral shoulder ROM and strength to place items on shelves at shoulder height with modified independence.    Baseline Pt. is able to reach items on shelves.    Status Achieved      OT LONG TERM GOAL #6   Title Pt will improve left UE coordination by decreasing time on 9 hole peg test by 10 secs to be able to manipulate and complete buttons with modified independence.    Baseline Pt. continues to have difficulty.refer to flow sheet for measurements    Time 12    Status On-going    Target Date 12/29/20                 Plan - 10/24/20 2118    Clinical Impression Statement Patient able to perform finger strengthening task however moderate resistive bulletin board was challenging to patient this date.  Patient continues to work towards manipulation of small items from 1  inch down to 1/2 inch in size.  Continued difficulty with translatory skills of the hand and using the hand for storage.  Some difficulty at times with graded pressure which may be due to decreased sensation of the hand.  Continue to work towards goals and plan of care to improve functional use of bilateral upper extremities to improve independence and necessary daily tasks.    OT Occupational Profile and History Detailed Assessment- Review of Records and additional review of physical, cognitive, psychosocial history related to current functional performance    Occupational performance deficits (Please refer to evaluation for details): ADL's;IADL's;Leisure;Work    Marketing executive / Function / Physical Skills ADL;Flexibility;ROM;UE functional use;FMC;Dexterity;Sensation;Strength;IADL;Coordination;Mobility;Decreased knowledge of use of DME;Balance;Endurance    Psychosocial Skills Environmental  Adaptations;Habits;Routines and Behaviors    Rehab Potential Good    Clinical Decision Making Several treatment options, min-mod task modification necessary    Comorbidities Affecting Occupational Performance: Presence of comorbidities impacting occupational performance    Modification or Assistance to Complete Evaluation  No modification of tasks or assist necessary to complete eval    OT Frequency 2x / week    OT Duration 6 weeks    OT Treatment/Interventions Self-care/ADL training;Therapeutic exercise;Neuromuscular education;Patient/family education;Therapeutic activities;Cryotherapy;Functional Mobility Training;DME and/or AE instruction;Balance training;Moist Heat    Consulted and Agree with Plan of Care Patient           Patient will benefit from skilled therapeutic intervention in order to improve the following deficits and impairments:   Body Structure / Function / Physical Skills: ADL,Flexibility,ROM,UE functional use,FMC,Dexterity,Sensation,Strength,IADL,Coordination,Mobility,Decreased knowledge of  use of DME,Balance,Endurance   Psychosocial Skills: Environmental  Adaptations,Habits,Routines and Behaviors   Visit Diagnosis: Muscle weakness (generalized)  Other lack of coordination  Unsteadiness on feet    Problem List Patient Active Problem List   Diagnosis Date Noted  . Carotid stenosis, right 09/17/2020  . S/P cervical spinal fusion   . Leukocytosis   . Essential hypertension   . Anemia of chronic disease   . Postoperative pain   . Neuropathic pain   . Cervical myelopathy (  [employer]) 08/07/2020  . Preop cardiovascular exam 07/17/2020  . SOB (shortness of breath) on exertion 07/17/2020  . Leg weakness, bilateral 07/13/2020  . Aortic atherosclerosis ([place]) 07/09/2020  . Myalgia 10/30/2019  . PAD (peripheral artery disease) ([place]) 06/06/2019  . CKD (chronic kidney disease) stage 3, GFR 30-59 ml/min (HCC) 04/29/2019  . B12 deficiency 01/23/2019  . Left arm numbness 02/28/2018  . Neck pain 02/28/2018  . Anemia 02/02/2017  . DDD (degenerative disc disease), cervical 02/02/2017  . Hypothyroid 02/02/2017  . MRSA (methicillin resistant staph aureus) culture positive 02/02/2017  . Nocturnal hypoxia 02/02/2017  . Senile purpura ([place]) 10/03/2016  . Essential hypertension, benign 09/16/2016  . Bilateral carotid artery disease ([place]) 09/16/2016  . 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/18/2016  . 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 History of prostate cancer 12/21/2015  . Left carpal tunnel syndrome 11/11/2015  . Kidney stone on left side 07/05/2015  . Myasthenia gravis ([place]) 05/26/2015  . Long-term use of high-risk medication 10/15/2014  . Persistent cough 09/10/2014  . Pure hypercholesterolemia 07/25/2014  . Spinal stenosis, lumbar region, with neurogenic claudication 02/24/2014  . Lumbosacral stenosis with neurogenic claudication ([place]) 02/24/2014  . COPD (chronic obstructive pulmonary disease) ([place]) 01/22/2014   Dianna Deshler T Tomasita Morrow, OTR/L, CLT  Breane Grunwald 10/24/2020, 9:26  PM  [place] MAIN [facility] SERVICES [address] [place], [place], [address] Phone: [phone]   Fax:  [phone]  Name: Francisco [name] MRN: [id number] Date of Birth: [birth year]

## 2020-10-26 ENCOUNTER — Ambulatory Visit: Payer: Medicare Other

## 2020-10-26 ENCOUNTER — Other Ambulatory Visit: Payer: Self-pay

## 2020-10-26 DIAGNOSIS — M6281 Muscle weakness (generalized): Secondary | ICD-10-CM

## 2020-10-26 DIAGNOSIS — R278 Other lack of coordination: Secondary | ICD-10-CM

## 2020-10-26 DIAGNOSIS — G8929 Other chronic pain: Secondary | ICD-10-CM

## 2020-10-26 DIAGNOSIS — R2681 Unsteadiness on feet: Secondary | ICD-10-CM

## 2020-10-26 NOTE — Therapy (Addendum)
[place] MAIN [facility] SERVICES [address] [place], [place], [address] Phone: [phone]   Fax:  [phone]  Physical Therapy Treatment  Patient Details  Name: Francisco [name] MRN: [id number] Date of Birth: 08/21/1946 Referring Provider (PT): Lauraine Rinne, PA-C   Encounter Date: 10/26/2020   PT End of Session - 10/26/20 1312    Visit Number 11    Number of Visits 25    Date for PT Re-Evaluation 12/29/20    Authorization Type [employer] Medicare    Authorization Time Period 08/25/20-11/17/20    PT Start Time 0103    PT Stop Time 0145    PT Time Calculation (min) 42 min    Equipment Utilized During Treatment Gait belt;Cervical collar    Activity Tolerance Patient tolerated treatment well;No increased pain;Patient limited by fatigue    Behavior During Therapy [facility] for tasks assessed/performed           Past Medical History:  Diagnosis Date  . Arthritis    lower left hip  . Atypical angina ([place])   . Bilateral hand numbness    from back surgery  . Bronchitis, chronic ([place])   . Cancer [facility])    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; Merkel cell cancer, and Basal cell cancer (twice; 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  . Carotid stenosis   . CKD (chronic kidney disease) stage 3, GFR 30-59 ml/min (HCC)   . 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 DDD (degenerative disc disease), cervical   . Hypercholesterolemia   . Hypertension   . Hypothyroidism    pt takes Levothyroxine daily  . Lumbosacral spinal stenosis   . Myasthenia gravis, adult form ([place])   . PAD (peripheral artery disease) ([place])   . Shortness of breath    Lung MD- Dr Darlin Coco  . Sleep apnea    do not use CPAP every night    Past Surgical History:  Procedure Laterality Date  . ANTERIOR CERVICAL DECOMP/DISCECTOMY FUSION  07/18/2011   Procedure: ANTERIOR CERVICAL DECOMPRESSION/DISCECTOMY FUSION 2 LEVELS;  Surgeon: Cooper Render Pool;  Location: [place] NEURO ORS;  Service:  Neurosurgery;  Laterality: N/A;  cervical five-six, cervical six-seven anterior cervical discectomy and fusion  . BACK SURGERY     in [place]     01/2020 Right, 04/2020 Left  . CARDIAC CATHETERIZATION     2005 at [facility], no stents  . CAROTID PTA/STENT INTERVENTION N/A 09/17/2020   Procedure: CAROTID PTA/STENT INTERVENTION;  Surgeon: Algernon Huxley, MD;  Location: [place] CV LAB;  Service: Cardiovascular;  Laterality: N/A;  . CATARACT EXTRACTION W/PHACO Left 01/06/2020   Procedure: CATARACT EXTRACTION PHACO AND INTRAOCULAR LENS PLACEMENT (IOC) ISTENT INJ LEFT 3.81  00:33.3;  Surgeon: Eulogio Bear, MD;  Location: [place];  Service: Ophthalmology;  Laterality: Left;  . CATARACT EXTRACTION W/PHACO Right 02/03/2020   Procedure: CATARACT EXTRACTION PHACO AND INTRAOCULAR LENS PLACEMENT ([place]) RIGHT ISTENT INJ;  Surgeon: Eulogio Bear, MD;  Location: [place];  Service: Ophthalmology;  Laterality: Right;  4.29 0:35.6  . COLONOSCOPY    . HERNIA REPAIR Left    inguinal hernia repair in 1985  . LUMBAR LAMINECTOMY/DECOMPRESSION MICRODISCECTOMY Left 02/24/2014   Procedure: LUMBAR LAMINECTOMY/DECOMPRESSION MICRODISCECTOMY LUMBAR THREE-FOUR, FOUR-FIVE, LEFT FIVE-SACRAL ONE ;  Surgeon: Charlie Pitter, MD;  Location: [place] NEURO ORS;  Service: Neurosurgery;  Laterality: Left;  LUMBAR LAMINECTOMY/DECOMPRESSION MICRODISCECTOMY LUMBAR THREE-FOUR, FOUR-FIVE, LEFT FIVE-SACRAL ONE   .  POSTERIOR CERVICAL FUSION/FORAMINOTOMY N/A 08/07/2020   Procedure: C3-6 POSTERIOR FUSION WITH DECOMPRESSION;  Surgeon: Meade Maw, MD;  Location: [facility] ORS;  Service: Neurosurgery;  Laterality: N/A;  . PROSTATECTOMY  8/14   [facility] Dr Mare Ferrari     There were no vitals filed for this visit.   Subjective Assessment - 10/26/20 1311    Subjective The patient reports he has been noticing positive changes wioth his balance.    Pertinent History Francisco [name] is a 54yoM referred to OPPT neuro s/p myelopathy s/p cervical decompression and fusion. Pt sustained several months of general decline in mobility prior to surgical decompression. Pt went to inpatient rehab after hospitalization. [facility].  Pt underwent C3-6 decompression, fusion on 11/26. PMH: myasthenia gravis, 2012 ACDF 5/6, 6/7; OSA, hypothyroidism, HTN, COPD, multiple back surgeries, 2021 carpal tunnel release, referred to OPOT, but only seen for evaluation. 8/10 OPPT evaluation for LSS c radiating pain into the left hip, only completed evaluation session. September - November progressive decline in leg strength and tolerance to upright activity. Also noted a decline in tool manipulation for ADL (pen and meal utensil). Seen by OT and PT as an outpatient for other potential etiology (Carpal tunnel syndrome, and lumbar spinal stenosis), but eventually identified to be related to cervical myelopathy. Several months prior has no limitations in mobility or function. Pt scheduled for Jan 6th: Carotid endarterectomy c Dr. Leotis Pain. Pt has a long history of lumbar DJD, s/p L5/S1 decompression discectomy x2, L5 nerve root injections. Most recent Lumbar MRI (11/10/18) revealin gof advanced formaminal stenosis of L3/4, L4/5, L5/S1. Pt is has had LLE neurological weakness and pain since 2015 and has been followed by neurosurgery.    Limitations Walking;Lifting;Standing;House hold activities    How long can you sit comfortably? Not limited    How long can you stand comfortably? 3-5 minutes    How long can you walk comfortably? 10 minutes    Patient Stated Goals return to functional baseline status of Summer 2021.    Currently in Pain? No/denies    Pain Score 0-No pain           Seated Pre Treatment vital signs: BP: 114/54 HR: 72   Standing hip abduction x BUE support x 2 10 reps each LE Hurdle step overs forward/backward x10 single UE support Hurdle step overs side stepping  x10 bilateral UE support Airex WBOS EO/EC x30"  Increased weight shift to right, cueing to equal WB no UE support Airex NBOS EO x30" increased weight shift to right intermittent UE support Airex Tandem Balance 30"x1 right foot in back UE support   Seated marches x 30 reps R, x15 L with 2# ankle weights  Seated LAQ x 2 10 reps with 2# ankle weights each LE Seated hamstring curls x GTB x 20 reps each LE Seated ankle DF/PF x 15 reps right LE; LLE x7 more difficult due to limited motion Seated hip abd/add crossovers x 15 reps each LE; no weight on LLE, 2# on RLE                          PT Education - 10/26/20 1354    Education provided Yes    Education Details balance    Person(s) Educated Patient    Methods Explanation    Comprehension Verbalized understanding            PT Short Term Goals - 10/21/20 1534  PT SHORT TERM GOAL #1   Title After 6 weeks patient will be independent in HEP to progress strength, AMB, and balance.    Baseline 2/9: HEP compliant    Time 6    Period Weeks    Status Partially Met    Target Date 11/17/20      PT SHORT TERM GOAL #2   Title After 6 weeks pt will demonstrate improved 5xSTS hands free chair + airex foam in <20sec    Baseline 01/25: 31s hands free without airex foam 2/9: unable to do on airex    Time 6    Period Weeks    Status On-going    Target Date 11/17/20             PT Long Term Goals - 10/21/20 1532      PT LONG TERM GOAL #1   Title Patient will reduce timed up and go to <11 seconds to reduce fall risk and demonstrate improved transfer/gait ability.    Baseline 01/25: 30.26s 2/9: 19.55 seconds with RW    Time 12    Period Weeks    Status Partially Met    Target Date 12/29/20      PT LONG TERM GOAL #2   Title Patient will demonstrate 6MWT (LRAD) >724f to demonstrate improved capacity to access the community.    Baseline 12/16: 120 ft, 01/25: 165 ft with 1 seated rest break for 2 mins 2/9: 395  ft with RW and one seated rest break    Time 12    Period Weeks    Status Partially Met    Target Date 12/29/20      PT LONG TERM GOAL #3   Title Patient will increase 10 meter walk test to >1.080m as to improve gait speed for better community ambulation and to reduce fall risk.    Baseline 01/25: 0.3821m2/9: 0.81 m/s with RW    Time 12    Period Weeks    Status Partially Met    Target Date 12/29/20      PT LONG TERM GOAL #4   Title After 12 weeks pt to demonstrate improved FOTO score >70    Baseline 01/25: 51 2/9: 55.97 %    Time 12    Period Weeks    Status Partially Met    Target Date 12/29/20      PT LONG TERM GOAL #5   Title Patient (> 60 18ars old) will complete five times sit to stand test in < 15 seconds indicating an increased LE strength and improved balance.    Baseline 01/25: 31s 2/9: hands on knees 13.09    Time 12    Period Weeks    Status Achieved                 Plan - 10/26/20 1313    Clinical Impression Statement Patient demonstrates good motivation throughout session.  The patient continues with weakness left LE greater than right and increased fatigue.  The patient presents with increased weight shift to right with balance activities with cueing needed to equalize weight.  The patient continues to benefit from additional skilled PT services to further improve LE strength and balance for reduced fall risk and improved quality of life.    Personal Factors and Comorbidities Age;Comorbidity 2;Past/Current Experience;Time since onset of injury/illness/exacerbation    Comorbidities COPD, History of Cancer, myasthenia gravis, chronic lumbar surgical history.    Examination-Activity Limitations Lift;Squat;Bend;Stairs;Stand;Transfers;LocInsurance claims handlerthing;Hygiene/Grooming;Dressing;Toileting    Examination-Participation  Restrictions Yard Work;Church;Cleaning;Driving    Stability/Clinical Decision Making Evolving/Moderate complexity     Rehab Potential Good    Clinical Impairments Affecting Rehab Potential PMH    PT Frequency 2x / week    PT Duration 12 weeks    PT Treatment/Interventions ADLs/Self Care Home Management;Electrical Stimulation;Moist Heat;Traction;Ultrasound;Gait training;Stair training;Functional mobility training;Therapeutic activities;Therapeutic exercise;Balance training;Neuromuscular re-education;Patient/family education;Manual techniques;Passive range of motion;Dry needling;Joint Manipulations;Cryotherapy    PT Next Visit Plan balance, strength    PT Home Exercise Plan HEP: seated marches, seated heel raises, seated LAQ    Consulted and Agree with Plan of Care Patient           Patient will benefit from skilled therapeutic intervention in order to improve the following deficits and impairments:  Abnormal gait,Decreased balance,Decreased mobility,Decreased endurance,Difficulty walking,Hypomobility,Impaired sensation,Decreased range of motion,Impaired perceived functional ability,Decreased activity tolerance,Decreased coordination,Decreased strength,Impaired flexibility,Postural dysfunction,Pain  Visit Diagnosis: Muscle weakness (generalized)  Other lack of coordination  Unsteadiness on feet  Chronic left-sided low back pain with left-sided sciatica     Problem List Patient Active Problem List   Diagnosis Date Noted  . Carotid stenosis, right 09/17/2020  . S/P cervical spinal fusion   . Leukocytosis   . Essential hypertension   . Anemia of chronic disease   . Postoperative pain   . Neuropathic pain   . Cervical myelopathy ([place]) 08/07/2020  . Preop cardiovascular exam 07/17/2020  . SOB (shortness of breath) on exertion 07/17/2020  . Leg weakness, bilateral 07/13/2020  . Aortic atherosclerosis ([place]) 07/09/2020  . Myalgia 10/30/2019  . PAD (peripheral artery disease) ([place]) 06/06/2019  . CKD (chronic kidney disease) stage 3, GFR 30-59 ml/min (HCC) 04/29/2019  . B12 deficiency 01/23/2019  .  Left arm numbness 02/28/2018  . Neck pain 02/28/2018  . Anemia 02/02/2017  . DDD (degenerative disc disease), cervical 02/02/2017  . Hypothyroid 02/02/2017  . MRSA (methicillin resistant staph aureus) culture positive 02/02/2017  . Nocturnal hypoxia 02/02/2017  . Senile purpura ([place]) 10/03/2016  . Essential hypertension, benign 09/16/2016  . Bilateral carotid artery disease ([place]) 09/16/2016  . 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/18/2016  . 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 History of prostate cancer 12/21/2015  . Left carpal tunnel syndrome 11/11/2015  . Kidney stone on left side 07/05/2015  . Myasthenia gravis ([place]) 05/26/2015  . Long-term use of high-risk medication 10/15/2014  . Persistent cough 09/10/2014  . Pure hypercholesterolemia 07/25/2014  . Spinal stenosis, lumbar region, with neurogenic claudication 02/24/2014  . Lumbosacral stenosis with neurogenic claudication ([place]) 02/24/2014  . COPD (chronic obstructive pulmonary disease) ([place]) 01/22/2014    Hal Morales PT, DPT 10/26/2020, 1:54 PM  [place] MAIN [facility] SERVICES [address] [place], [place], [address] Phone: [phone]   Fax:  [phone]  Name: Francisco [name] MRN: [id number] Date of Birth: October 25, 1945

## 2020-10-28 ENCOUNTER — Ambulatory Visit: Payer: Medicare Other

## 2020-10-28 ENCOUNTER — Ambulatory Visit: Payer: Medicare Other | Admitting: Occupational Therapy

## 2020-10-28 ENCOUNTER — Emergency Department
Admission: EM | Admit: 2020-10-28 | Discharge: 2020-10-28 | Disposition: A | Discharge status: Home / Self Care | Payer: Medicare Other | Attending: Emergency Medicine | Admitting: Emergency Medicine

## 2020-10-28 ENCOUNTER — Other Ambulatory Visit: Payer: Self-pay

## 2020-10-28 DIAGNOSIS — E039 Hypothyroidism, unspecified: Secondary | ICD-10-CM | POA: Diagnosis not present

## 2020-10-28 DIAGNOSIS — Z8546 Personal history of malignant neoplasm of prostate: Secondary | ICD-10-CM | POA: Diagnosis not present

## 2020-10-28 DIAGNOSIS — Z85821 Personal history of Merkel cell carcinoma: Secondary | ICD-10-CM | POA: Diagnosis not present

## 2020-10-28 DIAGNOSIS — I129 Hypertensive chronic kidney disease with stage 1 through stage 4 chronic kidney disease, or unspecified chronic kidney disease: Secondary | ICD-10-CM | POA: Diagnosis not present

## 2020-10-28 DIAGNOSIS — Z7902 Long term (current) use of antithrombotics/antiplatelets: Secondary | ICD-10-CM | POA: Diagnosis not present

## 2020-10-28 DIAGNOSIS — M545 Low back pain, unspecified: Secondary | ICD-10-CM | POA: Diagnosis not present

## 2020-10-28 DIAGNOSIS — N183 Chronic kidney disease, stage 3 unspecified: Secondary | ICD-10-CM | POA: Insufficient documentation

## 2020-10-28 DIAGNOSIS — Z7982 Long term (current) use of aspirin: Secondary | ICD-10-CM | POA: Insufficient documentation

## 2020-10-28 DIAGNOSIS — I959 Hypotension, unspecified: Secondary | ICD-10-CM | POA: Insufficient documentation

## 2020-10-28 DIAGNOSIS — M542 Cervicalgia: Secondary | ICD-10-CM | POA: Diagnosis not present

## 2020-10-28 DIAGNOSIS — J449 Chronic obstructive pulmonary disease, unspecified: Secondary | ICD-10-CM | POA: Insufficient documentation

## 2020-10-28 DIAGNOSIS — Z87891 Personal history of nicotine dependence: Secondary | ICD-10-CM | POA: Insufficient documentation

## 2020-10-28 DIAGNOSIS — Z79899 Other long term (current) drug therapy: Secondary | ICD-10-CM | POA: Diagnosis not present

## 2020-10-28 DIAGNOSIS — R031 Nonspecific low blood-pressure reading: Secondary | ICD-10-CM | POA: Diagnosis present

## 2020-10-28 LAB — CBC
HCT: 30.7 % — ABNORMAL LOW (ref 39.0–52.0)
Hemoglobin: 9.8 g/dL — ABNORMAL LOW (ref 13.0–17.0)
MCH: 33.3 pg (ref 26.0–34.0)
MCHC: 31.9 g/dL (ref 30.0–36.0)
MCV: 104.4 fL — ABNORMAL HIGH (ref 80.0–100.0)
Platelets: 304 10*3/uL (ref 150–400)
RBC: 2.94 MIL/uL — ABNORMAL LOW (ref 4.22–5.81)
RDW: 14 % (ref 11.5–15.5)
WBC: 7.2 10*3/uL (ref 4.0–10.5)
nRBC: 0.3 % — ABNORMAL HIGH (ref 0.0–0.2)

## 2020-10-28 LAB — BASIC METABOLIC PANEL
Anion gap: 10 (ref 5–15)
BUN: 31 mg/dL — ABNORMAL HIGH (ref 8–23)
CO2: 28 mmol/L (ref 22–32)
Calcium: 8.9 mg/dL (ref 8.9–10.3)
Chloride: 98 mmol/L (ref 98–111)
Creatinine, Ser: 1.4 mg/dL — ABNORMAL HIGH (ref 0.61–1.24)
GFR, Estimated: 52 mL/min — ABNORMAL LOW (ref >60)
Glucose, Bld: 92 mg/dL (ref 70–99)
Potassium: 3.6 mmol/L (ref 3.5–5.1)
Sodium: 136 mmol/L (ref 135–145)

## 2020-10-28 LAB — LACTIC ACID, PLASMA: Lactic Acid, Venous: 0.6 mmol/L (ref 0.5–1.9)

## 2020-10-28 LAB — TSH: TSH: 0.454 u[IU]/mL (ref 0.350–4.500)

## 2020-10-28 LAB — TROPONIN I (HIGH SENSITIVITY): Troponin I (High Sensitivity): 15 ng/L (ref <18)

## 2020-10-28 MED ORDER — SODIUM CHLORIDE 0.9 % IV SOLN: Freq: Once | INTRAVENOUS | Status: AC

## 2020-10-28 NOTE — ED Notes (Signed)
Contacted lab to add on troponin

## 2020-10-28 NOTE — ED Provider Notes (Signed)
[facility] Emergency Department Provider Note  ____________________________________________   Event Date/Time   First MD Initiated Contact with Patient 10/28/20 1827     (approximate)  I have reviewed the triage vital signs and the nursing notes.   HISTORY  Chief Complaint Hypotension   HPI Francisco [name] is a 75 y.o. male with a past medical history of arthritis, COPD, CKD, PAD, HTN, HDL, hypothyroidism, and carotid stenosis who presents for assessment of pain referred from his PCP for evaluation of some low blood pressures.  Patient states he suspects this is from his blood pressure medicine that he took last night.  He states that he has not taken his blood pressure medicine in several weeks and usually takes his blood pressure every evening and last night.  The top number was in the [place].  He states that he spoke to the on-call service for his PCP who recommended he take 50 mg of losartan and 25 mg of hydrochlorothiazide.  Patient states he took this in the notice of blood pressure trending down and went to bed.  He states when he woke up his systolic blood pressure was in the 90s and he felt little weak but did not have any other symptoms.  He states he went to his PCP who referred him to the ED.  States he never had any headache, lightheadedness, chest pain, cough, shortness of breath abdominal pain, nausea, vomiting, diarrhea, dysuria, rash or focal extremity pain weakness numbness or tingling.  States he has chronic pain in his neck and lower back, some recent surgeries but denies any other acute sick symptoms.  States he feels much better now after he received some IV fluids earlier today.  No other clear alleviating aggravating factors.  States he has been compliant with all his other medications and there have been no other recent changes.         Past Medical History:  Diagnosis Date  . Arthritis    lower left hip  . Atypical angina ([place])   .  Bilateral hand numbness    from back surgery  . Bronchitis, chronic ([place])   . Cancer [facility])    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; Merkel cell cancer, and Basal cell cancer (twice; 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  . Carotid stenosis   . CKD (chronic kidney disease) stage 3, GFR 30-59 ml/min (HCC)   . 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 DDD (degenerative disc disease), cervical   . Hypercholesterolemia   . Hypertension   . Hypothyroidism    pt takes Levothyroxine daily  . Lumbosacral spinal stenosis   . Myasthenia gravis, adult form ([place])   . PAD (peripheral artery disease) ([place])   . Shortness of breath    Lung MD- Dr Darlin Coco  . Sleep apnea    do not use CPAP every night    Patient Active Problem List   Diagnosis Date Noted  . Carotid stenosis, right 10/12/2020  . S/P cervical spinal fusion   . Leukocytosis   . Essential hypertension   . Anemia of chronic disease   . Postoperative pain   . Neuropathic pain   . Cervical myelopathy ([place]) 08/07/2020  . Preop cardiovascular exam 07/17/2020  . SOB (shortness of breath) on exertion 07/17/2020  . Leg weakness, bilateral 07/13/2020  . Aortic atherosclerosis ([place]) 07/09/2020  . Myalgia 10/30/2019  . PAD (peripheral artery disease) ([place]) 06/06/2019  . CKD (chronic kidney disease) stage 3, GFR  30-59 ml/min ([place]) 04/29/2019  . B12 deficiency 01/23/2019  . Left arm numbness 02/28/2018  . Neck pain 02/28/2018  . Anemia 02/02/2017  . DDD (degenerative disc disease), cervical 02/02/2017  . Hypothyroid 02/02/2017  . MRSA (methicillin resistant staph aureus) culture positive 02/02/2017  . Nocturnal hypoxia 02/02/2017  . Senile purpura ([place]) 10/03/2016  . Essential hypertension, benign 09/16/2016  . Bilateral carotid artery disease ([place]) 09/16/2016  . 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/18/2016  . 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 History of prostate cancer 12/21/2015  . Left carpal tunnel syndrome 11/11/2015  .  Kidney stone on left side 07/05/2015  . Myasthenia gravis ([place]) 05/26/2015  . Long-term use of high-risk medication 10/15/2014  . Persistent cough 09/10/2014  . Pure hypercholesterolemia 07/25/2014  . Spinal stenosis, lumbar region, with neurogenic claudication 02/24/2014  . Lumbosacral stenosis with neurogenic claudication ([place]) 02/24/2014  . COPD (chronic obstructive pulmonary disease) ([place]) 01/22/2014    Past Surgical History:  Procedure Laterality Date  . ANTERIOR CERVICAL DECOMP/DISCECTOMY FUSION  07/18/2011   Procedure: ANTERIOR CERVICAL DECOMPRESSION/DISCECTOMY FUSION 2 LEVELS;  Surgeon: Cooper Render Pool;  Location: [place] [facility];  Service: Neurosurgery;  Laterality: N/A;  cervical five-six, cervical six-seven anterior cervical discectomy and fusion  . BACK SURGERY     in [place]     01/2020 Right, 04/2020 Left  . CARDIAC CATHETERIZATION     2005 at [facility], no stents  . CAROTID PTA/STENT INTERVENTION N/A 10/01/2020   Procedure: CAROTID PTA/STENT INTERVENTION;  Surgeon: Algernon Huxley, MD;  Location: [place] [facility];  Service: Cardiovascular;  Laterality: N/A;  . CATARACT EXTRACTION W/PHACO Left 01/06/2020   Procedure: CATARACT EXTRACTION PHACO AND INTRAOCULAR LENS PLACEMENT (IOC) ISTENT INJ LEFT 3.81  00:33.3;  Surgeon: Eulogio Bear, MD;  Location: [place];  Service: Ophthalmology;  Laterality: Left;  . CATARACT EXTRACTION W/PHACO Right 02/03/2020   Procedure: CATARACT EXTRACTION PHACO AND INTRAOCULAR LENS PLACEMENT ([place]) RIGHT ISTENT INJ;  Surgeon: Eulogio Bear, MD;  Location: [place];  Service: Ophthalmology;  Laterality: Right;  4.29 0:35.6  . COLONOSCOPY    . HERNIA REPAIR Left    inguinal hernia repair in 1985  . LUMBAR LAMINECTOMY/DECOMPRESSION MICRODISCECTOMY Left 02/24/2014   Procedure: LUMBAR LAMINECTOMY/DECOMPRESSION MICRODISCECTOMY LUMBAR THREE-FOUR, FOUR-FIVE, LEFT FIVE-SACRAL ONE ;  Surgeon: Charlie Pitter, MD;  Location: [place] [facility];  Service: Neurosurgery;  Laterality: Left;  LUMBAR LAMINECTOMY/DECOMPRESSION MICRODISCECTOMY LUMBAR THREE-FOUR, FOUR-FIVE, LEFT FIVE-SACRAL ONE   . POSTERIOR CERVICAL FUSION/FORAMINOTOMY N/A 08/07/2020   Procedure: C3-6 POSTERIOR FUSION WITH DECOMPRESSION;  Surgeon: Meade Maw, MD;  Location: [facility] ORS;  Service: Neurosurgery;  Laterality: N/A;  . PROSTATECTOMY  8/14   [facility] Dr Mare Ferrari     Prior to Admission medications   Medication Sig Start Date End Date Taking? Authorizing Provider  acetaminophen (TYLENOL) 325 MG tablet Take 2 tablets (650 mg total) by mouth every 6 (six) hours as needed for mild pain. Patient not taking: No sig reported 08/11/20   Lonell Face, NP  alum & mag hydroxide-simeth (MAALOX/MYLANTA) 200-200-20 MG/5ML suspension Take 30 mLs by mouth every 6 (six) hours as needed for indigestion. Patient not taking: No sig reported 08/11/20   Lonell Face, NP  aspirin EC 81 MG EC tablet Take 1 tablet (81 mg total) by mouth daily at 6 (six) AM. Swallow whole. 09/20/20   Serafina Mitchell, MD  atorvastatin (LIPITOR) 10 MG tablet Take 1 tablet (  10 mg total) by mouth daily. 09/20/20   Serafina Mitchell, MD  azaTHIOprine (IMURAN) 50 MG tablet Take 150 mg by mouth daily.     [provider]  clopidogrel (PLAVIX) 75 MG tablet Take 1 tablet (75 mg total) by mouth daily at 6 (six) AM. 09/20/20   Trula Slade, Butch Penny, MD  furosemide (LASIX) 20 MG tablet Take 20 mg by mouth 2 (two) times daily as needed (swelling).  02/18/19   [provider]  HYDROcodone-acetaminophen (NORCO/VICODIN) 5-325 MG tablet Take 1 tablet by mouth 2 (two) times daily as needed for moderate pain.    [provider]  iron polysaccharides (NIFEREX) 150 MG capsule Take 1 capsule (150 mg total) by mouth 2 (two) times daily before lunch and supper. 08/20/20   Love, Ivan Anchors, PA-C  levothyroxine (SYNTHROID, LEVOTHROID) 88 MCG tablet Take 88 mcg by mouth daily.     [provider]  losartan (COZAAR) 50 MG tablet Take 50 mg by mouth 2 (two) times daily.     [provider]  magnesium oxide (MAG-OX) 400 (241.3 Mg) MG tablet Take 1 tablet (400 mg total) by mouth daily. Patient not taking: No sig reported 08/20/20   Love, Ivan Anchors, PA-C  oxybutynin (DITROPAN) 5 MG tablet Take 5 mg by mouth 3 (three) times daily. 07/15/20   [provider]  pantoprazole (PROTONIX) 40 MG tablet Take 40 mg by mouth 2 (two) times daily before a meal.     [provider]  polyethylene glycol (MIRALAX / GLYCOLAX) 17 g packet Take 17 g by mouth daily. May increase to twice a day if no BM in 24 hours Patient not taking: No sig reported 08/20/20   Love, Ivan Anchors, PA-C  predniSONE (DELTASONE) 20 MG tablet Take 20 mg by mouth every other day.     [provider]  pregabalin (LYRICA) 100 MG capsule Take 1 capsule (100 mg total) by mouth 2 (two) times daily. Patient not taking: No sig reported 08/20/20   Love, Ivan Anchors, PA-C  pyridostigmine (MESTINON) 60 MG tablet Take 60 mg by mouth in the morning and at bedtime.     [provider]  rOPINIRole (REQUIP) 4 MG tablet Take 4 mg by mouth at bedtime.     [provider]  senna (SENOKOT) 8.6 MG TABS tablet Take 2 tablets (17.2 mg total) by mouth daily. Patient not taking: No sig reported 08/20/20   Love, Ivan Anchors, PA-C  traZODone (DESYREL) 50 MG tablet Take 0.5 tablets (25 mg total) by mouth at bedtime as needed for sleep. 08/20/20   Love, Ivan Anchors, PA-C  vitamin B-12 (CYANOCOBALAMIN) 1000 MCG tablet Take 1,000 mcg by mouth daily. Patient not taking: No sig reported    [provider]    Allergies Azithromycin and Codeine  Family History  Problem Relation Age of Onset  . Hypertension Mother   . Stroke Mother   . Stroke Father     Social History Social History   Tobacco Use  . Smoking status: Former Smoker    Packs/day: 1.00    Years: 20.00    Pack years: 20.00     Types: Cigarettes    Quit date: 09/12/2001    Years since quitting: 19.1  . Smokeless tobacco: Never Used  Vaping Use  . Vaping Use: Never used  Substance Use Topics  . Alcohol use: Yes    Alcohol/week: 3.0 standard drinks    Types: 3 Glasses of wine per week  Comment: 3 glasses a wine a week  . Drug use: No    Review of Systems  Review of Systems  Constitutional: Negative for chills and fever.  HENT: Negative for sore throat.   Eyes: Negative for pain.  Respiratory: Negative for cough and stridor.   Cardiovascular: Negative for chest pain.  Gastrointestinal: Negative for vomiting.  Genitourinary: Negative for dysuria.  Musculoskeletal: Negative for myalgias.  Skin: Negative for rash.  Neurological: Positive for weakness. Negative for seizures, loss of consciousness and headaches.  Psychiatric/Behavioral: Negative for suicidal ideas.  All other systems reviewed and are negative.     ____________________________________________   PHYSICAL EXAM:  VITAL SIGNS: ED Triage Vitals  Enc Vitals Group     BP 10/28/20 1633 (!) 103/49     Pulse Rate 10/28/20 1633 72     Resp 10/28/20 1633 18     Temp 10/28/20 1633 98.1 F (36.7 C)     Temp src --      SpO2 10/28/20 1633 100 %     Weight --      Height --      Head Circumference --      Peak Flow --      Pain Score 10/28/20 1629 0     Pain Loc --      Pain Edu? --      Excl. in [place]? --    Vitals:   10/28/20 1633  BP: (!) 103/49  Pulse: 72  Resp: 18  Temp: 98.1 F (36.7 C)  SpO2: 100%   Physical Exam Vitals and nursing note reviewed.  Constitutional:      Appearance: He is well-developed and well-nourished.  HENT:     Head: Normocephalic and atraumatic.     Right Ear: External ear normal.     Left Ear: External ear normal.     Nose: Nose normal.     Mouth/Throat:     Mouth: Mucous membranes are moist.  Eyes:     Conjunctiva/sclera: Conjunctivae normal.  Cardiovascular:     Rate and Rhythm: Normal  rate and regular rhythm.     Heart sounds: No murmur heard.   Pulmonary:     Effort: Pulmonary effort is normal. No respiratory distress.     Breath sounds: Normal breath sounds.  Abdominal:     Palpations: Abdomen is soft.     Tenderness: There is no abdominal tenderness.  Musculoskeletal:        General: No edema.     Cervical back: Neck supple.  Skin:    General: Skin is warm and dry.     Capillary Refill: Capillary refill takes less than 2 seconds.  Neurological:     Mental Status: He is alert and oriented to person, place, and time.  Psychiatric:        Mood and Affect: Mood and affect and mood normal.      ____________________________________________   LABS (all labs ordered are listed, but only abnormal results are displayed)  Labs Reviewed  CBC - Abnormal; Notable for the following components:      Result Value   RBC 2.94 (*)    Hemoglobin 9.8 (*)    HCT 30.7 (*)    MCV 104.4 (*)    nRBC 0.3 (*)    All other components within normal limits  BASIC METABOLIC PANEL - Abnormal; Notable for the following components:   BUN 31 (*)    Creatinine, Ser 1.40 (*)    GFR, Estimated 52 (*)  All other components within normal limits  LACTIC ACID, PLASMA  TSH  LACTIC ACID, PLASMA  TROPONIN I (HIGH SENSITIVITY)  TROPONIN I (HIGH SENSITIVITY)   ____________________________________________  EKG  Sinus rhythm with sinus arrhythmia and no clearance of acute ischemia or significant underlying arrhythmia ____________________________________________  RADIOLOGY  ED MD interpretation:    Official radiology report(s): No results found.  ____________________________________________   PROCEDURES  Procedure(s) performed (including Critical Care):  .1-3 Lead EKG Interpretation Performed by: Lucrezia Starch, MD Authorized by: Lucrezia Starch, MD     Interpretation: normal     ECG rate assessment: normal     Rhythm: sinus rhythm     Ectopy: none     Conduction:  normal       ____________________________________________   INITIAL IMPRESSION / ASSESSMENT AND PLAN / ED COURSE      Patient presents with above to history exam after being for to the ED for further assessment of some low blood pressures obtained in clinic today after patient states he took his blood pressure medicine last night in the setting of elevated blood pressures at home and not taking his blood pressure medicines for several weeks.  He states he had a little generalized weakness this morning but has since been asymptomatic.  On arrival to the ED his borderline hypotensive with BP of 103/49.  However on multiple rechecks he was noted to be normotensive with systolics between the 846N and 140s.  In addition he denied any other acute symptoms.  Suspect low blood pressures and weakness were likely related to his BP meds he took last night.  ECG does not show any clear ischemia or significant arrhythmia.  CBC shows hemoglobin at baseline and no evidence of acute anemia or leukocytosis.  Patient denies any acute infectious symptoms and given absence of fever or other clear findings on history exam a very low suspicion for acute infectious process.  In addition lactic is not elevated.  BMP shows no significant electrolyte or metabolic derangements.  Kidney function is at baseline.  TSH is unremarkable.  Given reassuring vitals over approximately 3 hours in the emergency room with patient denying any symptoms and otherwise reassuring work-up and exam and clear source for his earlier symptoms I would be safe for discharge plan for outpatient follow-up.  Discharged stable condition.  Strict return precautions advised and discussed. ____________________________________________   FINAL CLINICAL IMPRESSION(S) / ED DIAGNOSES  Final diagnoses:  Hypotension, unspecified hypotension type    Medications  0.9 %  sodium chloride infusion ( Intravenous Stopped 10/28/20 1809)     ED Discharge Orders     None       Note:  This document was prepared using Dragon voice recognition software and may include unintentional dictation errors.   Lucrezia Starch, MD 10/28/20 2018

## 2020-10-28 NOTE — ED Triage Notes (Signed)
Pt comes with c/o hypotension. Pt states last night it was elevated and he spoke with his PCP and was advised to take his normal BP meds and 1/2 of his other. Pt states now his BP has been running too low.  MD Jens Som called from [facility] and thinks he may just need more fluids and overmedicated on the BP med.

## 2020-11-02 ENCOUNTER — Other Ambulatory Visit: Payer: Self-pay

## 2020-11-02 ENCOUNTER — Ambulatory Visit: Payer: Medicare Other | Admitting: Occupational Therapy

## 2020-11-02 ENCOUNTER — Ambulatory Visit: Payer: Medicare Other

## 2020-11-02 DIAGNOSIS — G8929 Other chronic pain: Secondary | ICD-10-CM

## 2020-11-02 DIAGNOSIS — M6281 Muscle weakness (generalized): Secondary | ICD-10-CM

## 2020-11-02 DIAGNOSIS — R2681 Unsteadiness on feet: Secondary | ICD-10-CM

## 2020-11-02 DIAGNOSIS — R278 Other lack of coordination: Secondary | ICD-10-CM

## 2020-11-02 NOTE — Therapy (Signed)
[place] MAIN [facility] SERVICES [address] [place], [place], [address] Phone: [phone]   Fax:  [phone]  Occupational Therapy Treatment  Patient Details  Name: Francisco [name] MRN: [id number] Date of Birth: [birth year] Referring Provider (OT): Dr Annette Stable   Encounter Date: 11/02/2020   OT End of Session - 11/03/20 1234    Visit Number 6    Number of Visits 12    Date for OT Re-Evaluation 12/29/20    OT Start Time 1345    OT Stop Time 1430    OT Time Calculation (min) 45 min    Activity Tolerance Patient tolerated treatment well    Behavior During Therapy [facility] for tasks assessed/performed           Past Medical History:  Diagnosis Date  . Arthritis    lower left hip  . Atypical angina ([place])   . Bilateral hand numbness    from back surgery  . Bronchitis, chronic ([place])   . Cancer [facility])    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; Merkel cell cancer, and Basal cell cancer (twice; 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  . Carotid stenosis   . CKD (chronic kidney disease) stage 3, GFR 30-59 ml/min (HCC)   . 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 DDD (degenerative disc disease), cervical   . Hypercholesterolemia   . Hypertension   . Hypothyroidism    pt takes Levothyroxine daily  . Lumbosacral spinal stenosis   . Myasthenia gravis, adult form ([place])   . PAD (peripheral artery disease) ([place])   . Shortness of breath    Lung MD- Dr Darlin Coco  . Sleep apnea    do not use CPAP every night    Past Surgical History:  Procedure Laterality Date  . ANTERIOR CERVICAL DECOMP/DISCECTOMY FUSION  07/18/2011   Procedure: ANTERIOR CERVICAL DECOMPRESSION/DISCECTOMY FUSION 2 LEVELS;  Surgeon: Cooper Render Pool;  Location: [place] [facility] [place] NEURO ORS;  Service: Neurosurgery;  Laterality: N/A;  cervical five-six, cervical six-seven anterior cervical discectomy and fusion  . BACK SURGERY     in [place]     01/2020 Right,  04/2020 Left  . CARDIAC CATHETERIZATION     2005 at [facility], no stents  . CAROTID PTA/STENT INTERVENTION N/A 09/17/2020   Procedure: CAROTID PTA/STENT INTERVENTION;  Surgeon: Algernon Huxley, MD;  Location: [place] CV LAB;  Service: Cardiovascular;  Laterality: N/A;  . CATARACT EXTRACTION W/PHACO Left 01/06/2020   Procedure: CATARACT EXTRACTION PHACO AND INTRAOCULAR LENS PLACEMENT (IOC) ISTENT INJ LEFT 3.81  00:33.3;  Surgeon: Eulogio Bear, MD;  Location: [place];  Service: Ophthalmology;  Laterality: Left;  . CATARACT EXTRACTION W/PHACO Right 02/03/2020   Procedure: CATARACT EXTRACTION PHACO AND INTRAOCULAR LENS PLACEMENT ([place]) RIGHT ISTENT INJ;  Surgeon: Eulogio Bear, MD;  Location: [place];  Service: Ophthalmology;  Laterality: Right;  4.29 0:35.6  . COLONOSCOPY    . HERNIA REPAIR Left    inguinal hernia repair in 1985  . LUMBAR LAMINECTOMY/DECOMPRESSION MICRODISCECTOMY Left 02/24/2014   Procedure: LUMBAR LAMINECTOMY/DECOMPRESSION MICRODISCECTOMY LUMBAR THREE-FOUR, FOUR-FIVE, LEFT FIVE-SACRAL ONE ;  Surgeon: Charlie Pitter, MD;  Location: [place] NEURO ORS;  Service: Neurosurgery;  Laterality: Left;  LUMBAR LAMINECTOMY/DECOMPRESSION MICRODISCECTOMY LUMBAR THREE-FOUR, FOUR-FIVE, LEFT FIVE-SACRAL ONE   . POSTERIOR CERVICAL FUSION/FORAMINOTOMY N/A 08/07/2020   Procedure: C3-6 POSTERIOR FUSION WITH DECOMPRESSION;  Surgeon: Meade Maw, MD;  Location: [facility] ORS;  Service: Neurosurgery;  Laterality: N/A;  .  PROSTATECTOMY  8/14   [facility] Dr Mare Ferrari     There were no vitals filed for this visit.   Subjective Assessment - 11/03/20 1233    Subjective  Pt reports he is weak in the left leg and has had nerve pain in the leg for a while.  He reports he couldn't do some of the tests in PT today.  "my legs are tired"    Pertinent History Pt is a 75 y/o M with hx of cervical myelopathy & admitted on 08/07/20 for scheduled C3-6 Posterior fusion with decompression by Dr.  Izora Ribas. PMH: atypical angina, PAD, carotid stenosis, HTN, HLD, COPD, Myasthenia gravis, hypothyroidism, CKD-III, OSA requiring nocturnal PAP therapy, OA, cervical DDD, lumbosacral spinal stenosis, chronic DOE 2/2 COPD.  Pt went to IP rehab and was discharged on 08/20/2020 with orders for OP OT and PT.  History of carpal tunnel bilaterally.    Patient Stated Goals Pt would like to be able to walk again, and be independent as he was before.    Currently in Pain? No/denies    Pain Score 0-No pain          Therapeutic Exercise: 2# dumbbells bilateral use for overhead press, chest press, ABD to 90 degrees, biceps curls, supination/pronation, wrist flexion/extension Left more difficult than right 2 sets 10 reps each cues for proper form and technique   Grip strengthening 5th setting 28# for 20 reps right hand, cues for proper hand placement. With left hand able to perform 7 reps with 28# then switched to 23# for remaining pegs.  ADL: Toilet transfer supervision for transfer to bathroom   manipulation of medication bottle with small simulated "pills" to pick up and place into container, 1/2 inch in size.  More difficulty with 1/4 inch in size but able to complete with focus and attention, dropped 1 of 12.  Response to tx:  Patient continues to progress towards goals.  Strength continues to improve with BUE, has a little more difficulty with managing light dumbbells on left than right.  Grip continues to progress and able to perform 7 reps on left with increased resistance to 28# of pressure.  Continues to demo difficulty with mobility to and from bathroom and requires supervision for sit to stand transfer.  Able to manage simulated pills which are larger but has difficulty with smaller one, less than 1/4 inch in size.  Continue to work towards goals in plan of care to increase independence in necessary daily tasks.                    OT Education - 11/04/20 1233    Education  Details [place], strength    Person(s) Educated Patient    Methods Explanation;Demonstration;Handout;Verbal cues;Tactile cues    Comprehension Verbalized understanding;Returned demonstration;Verbal cues required               OT Long Term Goals - 10/06/20 1113      OT LONG TERM GOAL #1   Title Patient to be independent with home exercise program for strengthening and coordination skills.    Baseline decreased strength and coordination bilaterally after cervical surgery    Time 6    Period Weeks    Status On-going    Target Date 12/29/20      OT LONG TERM GOAL #2   Title Patient will improve grip strength bilaterally by 8# to hold objects securely without dropping.    Baseline Improving grip strength. Refer to [employer]  Time 12    Period Weeks    Status On-going    Target Date 12/29/20      OT LONG TERM GOAL #3   Title Patient will perform self care tasks with modified independence.    Baseline Independent with morning care    Status Achieved      OT LONG TERM GOAL #4   Title Pt will complete light homemaking tasks with modified independence.    Baseline wife conitnues to assist    Time 12    Period Weeks    Status On-going    Target Date 12/29/20      OT LONG TERM GOAL #5   Title Patient will improve bilateral shoulder ROM and strength to place items on shelves at shoulder height with modified independence.    Baseline Pt. is able to reach items on shelves.    Status Achieved      OT LONG TERM GOAL #6   Title Pt will improve left UE coordination by decreasing time on 9 hole peg test by 10 secs to be able to manipulate and complete buttons with modified independence.    Baseline Pt. continues to have difficulty.refer to flow sheet for measurements    Time 12    Status On-going    Target Date 12/29/20                 Plan - 11/03/20 1235    Clinical Impression Statement Patient continues to progress towards goals.  Strength continues to improve with  BUE, has a little more difficulty with managing light dumbbells on left than right.  Grip continues to progress and able to perform 7 reps on left with increased resistance to 28# of pressure.  Continues to demo difficulty with mobility to and from bathroom and requires supervision for sit to stand transfer.  Able to manage simulated pills which are larger but has difficulty with smaller one, less than 1/4 inch in size.  Continue to work towards goals in plan of care to increase independence in necessary daily tasks.    OT Occupational Profile and History Detailed Assessment- Review of Records and additional review of physical, cognitive, psychosocial history related to current functional performance    Occupational performance deficits (Please refer to evaluation for details): ADL's;IADL's;Leisure;Work    Marketing executive / Function / Physical Skills ADL;Flexibility;ROM;UE functional use;FMC;Dexterity;Sensation;Strength;IADL;Coordination;Mobility;Decreased knowledge of use of DME;Balance;Endurance    Psychosocial Skills Environmental  Adaptations;Habits;Routines and Behaviors    Rehab Potential Good    Clinical Decision Making Several treatment options, min-mod task modification necessary    Comorbidities Affecting Occupational Performance: Presence of comorbidities impacting occupational performance    Modification or Assistance to Complete Evaluation  No modification of tasks or assist necessary to complete eval    OT Frequency 2x / week    OT Duration 6 weeks    OT Treatment/Interventions Self-care/ADL training;Therapeutic exercise;Neuromuscular education;Patient/family education;Therapeutic activities;Cryotherapy;Functional Mobility Training;DME and/or AE instruction;Balance training;Moist Heat    Consulted and Agree with Plan of Care Patient           Patient will benefit from skilled therapeutic intervention in order to improve the following deficits and impairments:   Body Structure /  Function / Physical Skills: ADL,Flexibility,ROM,UE functional use,FMC,Dexterity,Sensation,Strength,IADL,Coordination,Mobility,Decreased knowledge of use of DME,Balance,Endurance   Psychosocial Skills: Environmental  Adaptations,Habits,Routines and Behaviors   Visit Diagnosis: Muscle weakness (generalized)  Other lack of coordination  Unsteadiness on feet    Problem List Patient Active Problem List   Diagnosis Date Noted  .  Carotid stenosis, right 09/17/2020  . S/P cervical spinal fusion   . Leukocytosis   . Essential hypertension   . Anemia of chronic disease   . Postoperative pain   . Neuropathic pain   . Cervical myelopathy ([place]) 08/07/2020  . Preop cardiovascular exam 07/17/2020  . SOB (shortness of breath) on exertion 07/17/2020  . Leg weakness, bilateral 07/13/2020  . Aortic atherosclerosis ([place]) 07/09/2020  . Myalgia 10/30/2019  . PAD (peripheral artery disease) ([place]) 06/06/2019  . CKD (chronic kidney disease) stage 3, GFR 30-59 ml/min (HCC) 04/29/2019  . B12 deficiency 01/23/2019  . Left arm numbness 02/28/2018  . Neck pain 02/28/2018  . Anemia 02/02/2017  . DDD (degenerative disc disease), cervical 02/02/2017  . Hypothyroid 02/02/2017  . MRSA (methicillin resistant staph aureus) culture positive 02/02/2017  . Nocturnal hypoxia 02/02/2017  . Senile purpura ([place]) 10/03/2016  . Essential hypertension, benign 09/16/2016  . Bilateral carotid artery disease ([place]) 09/16/2016  . 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/18/2016  . 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 History of prostate cancer 12/21/2015  . Left carpal tunnel syndrome 11/11/2015  . Kidney stone on left side 07/05/2015  . Myasthenia gravis ([place]) 05/26/2015  . Long-term use of high-risk medication 10/15/2014  . Persistent cough 09/10/2014  . Pure hypercholesterolemia 07/25/2014  . Spinal stenosis, lumbar region, with neurogenic claudication 02/24/2014  . Lumbosacral stenosis with neurogenic  claudication ([place]) 02/24/2014  . COPD (chronic obstructive pulmonary disease) ([place]) 01/22/2014   Adisa Vigeant T Tomasita Morrow, OTR/L, CLT Geanie Pacifico 11/04/2020, 12:42 PM  [place] MAIN [facility] SERVICES [address] [place], [place], [address] Phone: [phone]   Fax:  [phone]  Name: TRUSTIN CHAPA MRN: [id number] Date of Birth: 24-Jun-1946

## 2020-11-02 NOTE — Therapy (Signed)
[place] MAIN [facility] SERVICES [address] [place], [place], [address] Phone: [phone]   Fax:  [phone]  Physical Therapy Treatment  Patient Details  Name: Francisco [name] MRN: [id number] Date of Birth: 1946/05/21 Referring Provider (PT): Lauraine Rinne, PA-C   Encounter Date: 11/02/2020   PT End of Session - 11/02/20 1259    Visit Number 12    Number of Visits 25    Date for PT Re-Evaluation 12/29/20    Authorization Type [employer] Medicare    Authorization Time Period 08/25/20-11/17/20    PT Start Time 0100    PT Stop Time 0145    PT Time Calculation (min) 45 min    Equipment Utilized During Treatment Gait belt;Cervical collar    Activity Tolerance Patient tolerated treatment well;No increased pain;Patient limited by fatigue    Behavior During Therapy [facility] for tasks assessed/performed           Past Medical History:  Diagnosis Date  . Arthritis    lower left hip  . Atypical angina ([place])   . Bilateral hand numbness    from back surgery  . Bronchitis, chronic ([place])   . Cancer [facility])    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; Merkel cell cancer, and Basal cell cancer (twice; 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  . Carotid stenosis   . CKD (chronic kidney disease) stage 3, GFR 30-59 ml/min (HCC)   . 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 DDD (degenerative disc disease), cervical   . Hypercholesterolemia   . Hypertension   . Hypothyroidism    pt takes Levothyroxine daily  . Lumbosacral spinal stenosis   . Myasthenia gravis, adult form ([place])   . PAD (peripheral artery disease) ([place])   . Shortness of breath    Lung MD- Dr Darlin Coco  . Sleep apnea    do not use CPAP every night    Past Surgical History:  Procedure Laterality Date  . ANTERIOR CERVICAL DECOMP/DISCECTOMY FUSION  07/18/2011   Procedure: ANTERIOR CERVICAL DECOMPRESSION/DISCECTOMY FUSION 2 LEVELS;  Surgeon: Cooper Render Pool;  Location: [place] NEURO ORS;  Service:  Neurosurgery;  Laterality: N/A;  cervical five-six, cervical six-seven anterior cervical discectomy and fusion  . BACK SURGERY     in [place]     01/2020 Right, 04/2020 Left  . CARDIAC CATHETERIZATION     2005 at [facility], no stents  . CAROTID PTA/STENT INTERVENTION N/A 09/17/2020   Procedure: CAROTID PTA/STENT INTERVENTION;  Surgeon: Algernon Huxley, MD;  Location: [place] CV LAB;  Service: Cardiovascular;  Laterality: N/A;  . CATARACT EXTRACTION W/PHACO Left 01/06/2020   Procedure: CATARACT EXTRACTION PHACO AND INTRAOCULAR LENS PLACEMENT (IOC) ISTENT INJ LEFT 3.81  00:33.3;  Surgeon: Eulogio Bear, MD;  Location: [place];  Service: Ophthalmology;  Laterality: Left;  . CATARACT EXTRACTION W/PHACO Right 02/03/2020   Procedure: CATARACT EXTRACTION PHACO AND INTRAOCULAR LENS PLACEMENT ([place]) RIGHT ISTENT INJ;  Surgeon: Eulogio Bear, MD;  Location: [place];  Service: Ophthalmology;  Laterality: Right;  4.29 0:35.6  . COLONOSCOPY    . HERNIA REPAIR Left    inguinal hernia repair in 1985  . LUMBAR LAMINECTOMY/DECOMPRESSION MICRODISCECTOMY Left 02/24/2014   Procedure: LUMBAR LAMINECTOMY/DECOMPRESSION MICRODISCECTOMY LUMBAR THREE-FOUR, FOUR-FIVE, LEFT FIVE-SACRAL ONE ;  Surgeon: Charlie Pitter, MD;  Location: [place] NEURO ORS;  Service: Neurosurgery;  Laterality: Left;  LUMBAR LAMINECTOMY/DECOMPRESSION MICRODISCECTOMY LUMBAR THREE-FOUR, FOUR-FIVE, LEFT FIVE-SACRAL ONE   .  POSTERIOR CERVICAL FUSION/FORAMINOTOMY N/A 08/07/2020   Procedure: C3-6 POSTERIOR FUSION WITH DECOMPRESSION;  Surgeon: Meade Maw, MD;  Location: [facility] ORS;  Service: Neurosurgery;  Laterality: N/A;  . PROSTATECTOMY  8/14   [facility] Dr Mare Ferrari     There were no vitals filed for this visit.   Subjective Assessment - 11/02/20 1300    Subjective The patient went to ED last week due to having low blood pressure.  The patient's BP medicine was adjusted and his  home BP readings have improved.    Pertinent History Francisco [name] is a 48yoM referred to OPPT neuro s/p myelopathy s/p cervical decompression and fusion. Pt sustained several months of general decline in mobility prior to surgical decompression. Pt went to inpatient rehab after hospitalization. [facility].  Pt underwent C3-6 decompression, fusion on 11/26. PMH: myasthenia gravis, 2012 ACDF 5/6, 6/7; OSA, hypothyroidism, HTN, COPD, multiple back surgeries, 2021 carpal tunnel release, referred to OPOT, but only seen for evaluation. 8/10 OPPT evaluation for LSS c radiating pain into the left hip, only completed evaluation session. September - November progressive decline in leg strength and tolerance to upright activity. Also noted a decline in tool manipulation for ADL (pen and meal utensil). Seen by OT and PT as an outpatient for other potential etiology (Carpal tunnel syndrome, and lumbar spinal stenosis), but eventually identified to be related to cervical myelopathy. Several months prior has no limitations in mobility or function. Pt scheduled for Jan 6th: Carotid endarterectomy c Dr. Leotis Pain. Pt has a long history of lumbar DJD, s/p L5/S1 decompression discectomy x2, L5 nerve root injections. Most recent Lumbar MRI (11/10/18) revealin gof advanced formaminal stenosis of L3/4, L4/5, L5/S1. Pt is has had LLE neurological weakness and pain since 2015 and has been followed by neurosurgery.    Limitations Walking;Lifting;Standing;House hold activities    How long can you sit comfortably? Not limited    How long can you stand comfortably? 3-5 minutes    How long can you walk comfortably? 10 minutes    Patient Stated Goals return to functional baseline status of Summer 2021.    Currently in Pain? No/denies    Pain Score 0-No pain           Seated Pre Treatment vital signs: BP: 129/57 HR: 78 SpO2 98%   Standing hip abduction x BUE support x 2 10 reps each LE Airex WBOS EO/EC  x30"  Increased weight shift to right, cueing to equal WB no UE support Airex Tandem Balance 30"x1 right foot in back UE support Airex step up forward x6 each with intermittent UE support   Seated marches x 20 reps R, x20 L with 2# ankle weights  Seated LAQ x 2 10 reps with 2# ankle weights each LE Seated hamstring curls x GTB x 20 reps each LE Seated ankle DF/PF x 20 reps right LE; LLE x20 more difficult due to limited motion                             PT Short Term Goals - 10/21/20 1534      PT SHORT TERM GOAL #1   Title After 6 weeks patient will be independent in HEP to progress strength, AMB, and balance.    Baseline 2/9: HEP compliant    Time 6    Period Weeks    Status Partially Met    Target Date 11/17/20  PT SHORT TERM GOAL #2   Title After 6 weeks pt will demonstrate improved 5xSTS hands free chair + airex foam in <20sec    Baseline 01/25: 31s hands free without airex foam 2/9: unable to do on airex    Time 6    Period Weeks    Status On-going    Target Date 11/17/20             PT Long Term Goals - 10/21/20 1532      PT LONG TERM GOAL #1   Title Patient will reduce timed up and go to <11 seconds to reduce fall risk and demonstrate improved transfer/gait ability.    Baseline 01/25: 30.26s 2/9: 19.55 seconds with RW    Time 12    Period Weeks    Status Partially Met    Target Date 12/29/20      PT LONG TERM GOAL #2   Title Patient will demonstrate 6MWT (LRAD) >762f to demonstrate improved capacity to access the community.    Baseline 12/16: 120 ft, 01/25: 165 ft with 1 seated rest break for 2 mins 2/9: 395 ft with RW and one seated rest break    Time 12    Period Weeks    Status Partially Met    Target Date 12/29/20      PT LONG TERM GOAL #3   Title Patient will increase 10 meter walk test to >1.045m as to improve gait speed for better community ambulation and to reduce fall risk.    Baseline 01/25: 0.3853m2/9: 0.81 m/s  with RW    Time 12    Period Weeks    Status Partially Met    Target Date 12/29/20      PT LONG TERM GOAL #4   Title After 12 weeks pt to demonstrate improved FOTO score >70    Baseline 01/25: 51 2/9: 55.97 %    Time 12    Period Weeks    Status Partially Met    Target Date 12/29/20      PT LONG TERM GOAL #5   Title Patient (> 60 101ars old) will complete five times sit to stand test in < 15 seconds indicating an increased LE strength and improved balance.    Baseline 01/25: 31s 2/9: hands on knees 13.09    Time 12    Period Weeks    Status Achieved                 Plan - 11/02/20 1316    Clinical Impression Statement Patient motivated throughout session.  Patient challenged with balance activities wiht need for intermittent UE support and CGA.  Patient presents with increased weakness left LE greater than right with increased muscle fatigue on left,.  The patient continues to benefit from additional skilled PT services to improve LE strength and balance for improved quality of life.    Personal Factors and Comorbidities Age;Comorbidity 2;Past/Current Experience;Time since onset of injury/illness/exacerbation    Comorbidities COPD, History of Cancer, myasthenia gravis, chronic lumbar surgical history.    Examination-Activity Limitations Lift;Squat;Bend;Stairs;Stand;Transfers;LocInsurance claims handlerthing;Hygiene/Grooming;Dressing;Toileting    Examination-Participation Restrictions Yard Work;Church;Cleaning;Driving    Stability/Clinical Decision Making Evolving/Moderate complexity    Rehab Potential Good    Clinical Impairments Affecting Rehab Potential PMH    PT Frequency 2x / week    PT Duration 12 weeks    PT Treatment/Interventions ADLs/Self Care Home Management;Electrical Stimulation;Moist Heat;Traction;Ultrasound;Gait training;Stair training;Functional mobility training;Therapeutic activities;Therapeutic exercise;Balance training;Neuromuscular  re-education;Patient/family education;Manual techniques;Passive range of motion;Dry  needling;Joint Manipulations;Cryotherapy    PT Next Visit Plan balance, strength    PT Home Exercise Plan HEP: seated marches, seated heel raises, seated LAQ    Consulted and Agree with Plan of Care Patient           Patient will benefit from skilled therapeutic intervention in order to improve the following deficits and impairments:  Abnormal gait,Decreased balance,Decreased mobility,Decreased endurance,Difficulty walking,Hypomobility,Impaired sensation,Decreased range of motion,Impaired perceived functional ability,Decreased activity tolerance,Decreased coordination,Decreased strength,Impaired flexibility,Postural dysfunction,Pain  Visit Diagnosis: Muscle weakness (generalized)  Other lack of coordination  Unsteadiness on feet  Chronic left-sided low back pain with left-sided sciatica     Problem List Patient Active Problem List   Diagnosis Date Noted  . Carotid stenosis, right 09/17/2020  . S/P cervical spinal fusion   . Leukocytosis   . Essential hypertension   . Anemia of chronic disease   . Postoperative pain   . Neuropathic pain   . Cervical myelopathy ([place]) 08/07/2020  . Preop cardiovascular exam 07/17/2020  . SOB (shortness of breath) on exertion 07/17/2020  . Leg weakness, bilateral 07/13/2020  . Aortic atherosclerosis ([place]) 07/09/2020  . Myalgia 10/30/2019  . PAD (peripheral artery disease) ([place]) 06/06/2019  . CKD (chronic kidney disease) stage 3, GFR 30-59 ml/min (HCC) 04/29/2019  . B12 deficiency 01/23/2019  . Left arm numbness 02/28/2018  . Neck pain 02/28/2018  . Anemia 02/02/2017  . DDD (degenerative disc disease), cervical 02/02/2017  . Hypothyroid 02/02/2017  . MRSA (methicillin resistant staph aureus) culture positive 02/02/2017  . Nocturnal hypoxia 02/02/2017  . Senile purpura ([place]) 10/03/2016  . Essential hypertension, benign 09/16/2016  . Bilateral carotid  artery disease ([place]) 09/16/2016  . 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/18/2016  . 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 History of prostate cancer 12/21/2015  . Left carpal tunnel syndrome 11/11/2015  . Kidney stone on left side 07/05/2015  . Myasthenia gravis ([place]) 05/26/2015  . Long-term use of high-risk medication 10/15/2014  . Persistent cough 09/10/2014  . Pure hypercholesterolemia 07/25/2014  . Spinal stenosis, lumbar region, with neurogenic claudication 02/24/2014  . Lumbosacral stenosis with neurogenic claudication ([place]) 02/24/2014  . COPD (chronic obstructive pulmonary disease) ([place]) 01/22/2014    Hal Morales PT, DPT 11/02/2020, 2:35 PM  [place] MAIN [facility] SERVICES [address] [place], [place], [address] Phone: [phone]   Fax:  [phone]  Name: Francisco [name] MRN: [id number] Date of Birth: March 12, 1946

## 2020-11-04 ENCOUNTER — Other Ambulatory Visit: Payer: Self-pay

## 2020-11-04 ENCOUNTER — Ambulatory Visit: Payer: Medicare Other | Admitting: Occupational Therapy

## 2020-11-04 ENCOUNTER — Encounter: Payer: Self-pay | Admitting: Occupational Therapy

## 2020-11-04 ENCOUNTER — Ambulatory Visit: Payer: Medicare Other

## 2020-11-04 DIAGNOSIS — R278 Other lack of coordination: Secondary | ICD-10-CM

## 2020-11-04 DIAGNOSIS — R2681 Unsteadiness on feet: Secondary | ICD-10-CM

## 2020-11-04 DIAGNOSIS — M6281 Muscle weakness (generalized): Secondary | ICD-10-CM

## 2020-11-04 DIAGNOSIS — G8929 Other chronic pain: Secondary | ICD-10-CM

## 2020-11-04 NOTE — Therapy (Signed)
[place] MAIN [facility] SERVICES [address] [place], [place], [address] Phone: [phone]   Fax:  [phone]  Physical Therapy Treatment  Patient Details  Name: Francisco [name] MRN: [id number] Date of Birth: 10-08-1945 Referring Provider (PT): Lauraine Rinne, PA-C   Encounter Date: 11/04/2020   PT End of Session - 11/04/20 1314    Visit Number 13    Number of Visits 25    Date for PT Re-Evaluation 12/29/20    Authorization Type [employer] Medicare    Authorization Time Period 08/25/20-11/17/20    PT Start Time 1302    PT Stop Time 1344    PT Time Calculation (min) 42 min    Equipment Utilized During Treatment Gait belt;Cervical collar    Activity Tolerance Patient tolerated treatment well;No increased pain;Patient limited by fatigue    Behavior During Therapy [facility] for tasks assessed/performed           Past Medical History:  Diagnosis Date  . Arthritis    lower left hip  . Atypical angina ([place])   . Bilateral hand numbness    from back surgery  . Bronchitis, chronic ([place])   . Cancer [facility])    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; Merkel cell cancer, and Basal cell cancer (twice; 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  . Carotid stenosis   . CKD (chronic kidney disease) stage 3, GFR 30-59 ml/min (HCC)   . 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 DDD (degenerative disc disease), cervical   . Hypercholesterolemia   . Hypertension   . Hypothyroidism    pt takes Levothyroxine daily  . Lumbosacral spinal stenosis   . Myasthenia gravis, adult form ([place])   . PAD (peripheral artery disease) ([place])   . Shortness of breath    Lung MD- Dr Darlin Coco  . Sleep apnea    do not use CPAP every night    Past Surgical History:  Procedure Laterality Date  . ANTERIOR CERVICAL DECOMP/DISCECTOMY FUSION  07/18/2011   Procedure: ANTERIOR CERVICAL DECOMPRESSION/DISCECTOMY FUSION 2 LEVELS;  Surgeon: Cooper Render Pool;  Location: [place] [facility]URO ORS;  Service:  Neurosurgery;  Laterality: N/A;  cervical five-six, cervical six-seven anterior cervical discectomy and fusion  . BACK SURGERY     in [place]     01/2020 Right, 04/2020 Left  . CARDIAC CATHETERIZATION     2005 at [facility], no stents  . CAROTID PTA/STENT INTERVENTION N/A 09/17/2020   Procedure: CAROTID PTA/STENT INTERVENTION;  Surgeon: Algernon Huxley, MD;  Location: [place] CV LAB;  Service: Cardiovascular;  Laterality: N/A;  . CATARACT EXTRACTION W/PHACO Left 01/06/2020   Procedure: CATARACT EXTRACTION PHACO AND INTRAOCULAR LENS PLACEMENT (IOC) ISTENT INJ LEFT 3.81  00:33.3;  Surgeon: Eulogio Bear, MD;  Location: [place];  Service: Ophthalmology;  Laterality: Left;  . CATARACT EXTRACTION W/PHACO Right 02/03/2020   Procedure: CATARACT EXTRACTION PHACO AND INTRAOCULAR LENS PLACEMENT ([place]) RIGHT ISTENT INJ;  Surgeon: Eulogio Bear, MD;  Location: [place];  Service: Ophthalmology;  Laterality: Right;  4.29 0:35.6  . COLONOSCOPY    . HERNIA REPAIR Left    inguinal hernia repair in 1985  . LUMBAR LAMINECTOMY/DECOMPRESSION MICRODISCECTOMY Left 02/24/2014   Procedure: LUMBAR LAMINECTOMY/DECOMPRESSION MICRODISCECTOMY LUMBAR THREE-FOUR, FOUR-FIVE, LEFT FIVE-SACRAL ONE ;  Surgeon: Charlie Pitter, MD;  Location: [place] NEURO ORS;  Service: Neurosurgery;  Laterality: Left;  LUMBAR LAMINECTOMY/DECOMPRESSION MICRODISCECTOMY LUMBAR THREE-FOUR, FOUR-FIVE, LEFT FIVE-SACRAL ONE   .  POSTERIOR CERVICAL FUSION/FORAMINOTOMY N/A 08/07/2020   Procedure: C3-6 POSTERIOR FUSION WITH DECOMPRESSION;  Surgeon: Meade Maw, MD;  Location: [facility] ORS;  Service: Neurosurgery;  Laterality: N/A;  . PROSTATECTOMY  8/14   [facility] Dr Mare Ferrari     There were no vitals filed for this visit.   Subjective Assessment - 11/04/20 1309    Subjective Patient reports he was able to practice getting up and walking without AD. No falls or LOB since last session.     Pertinent History Francisco [name] is a 34yoM referred to OPPT neuro s/p myelopathy s/p cervical decompression and fusion. Pt sustained several months of general decline in mobility prior to surgical decompression. Pt went to inpatient rehab after hospitalization. [facility].  Pt underwent C3-6 decompression, fusion on 11/26. PMH: myasthenia gravis, 2012 ACDF 5/6, 6/7; OSA, hypothyroidism, HTN, COPD, multiple back surgeries, 2021 carpal tunnel release, referred to OPOT, but only seen for evaluation. 8/10 OPPT evaluation for LSS c radiating pain into the left hip, only completed evaluation session. September - November progressive decline in leg strength and tolerance to upright activity. Also noted a decline in tool manipulation for ADL (pen and meal utensil). Seen by OT and PT as an outpatient for other potential etiology (Carpal tunnel syndrome, and lumbar spinal stenosis), but eventually identified to be related to cervical myelopathy. Several months prior has no limitations in mobility or function. Pt scheduled for Jan 6th: Carotid endarterectomy c Dr. Leotis Pain. Pt has a long history of lumbar DJD, s/p L5/S1 decompression discectomy x2, L5 nerve root injections. Most recent Lumbar MRI (11/10/18) revealin gof advanced formaminal stenosis of L3/4, L4/5, L5/S1. Pt is has had LLE neurological weakness and pain since 2015 and has been followed by neurosurgery.    Limitations Walking;Lifting;Standing;House hold activities    How long can you sit comfortably? Not limited    How long can you stand comfortably? 3-5 minutes    How long can you walk comfortably? 10 minutes    Patient Stated Goals return to functional baseline status of Summer 2021.    Currently in Pain? No/denies                vitals at start of session: 135/59      Standing next to support surface.   orange hurdle: forward step over and back, challenging LLE 10x each LE cues for breathing and stepping  technique Orange hurdle; lateral step over and back 15x each side.  Weight shift onto LLE with RLE on dynadisc 30 seconds x 2 trials Modified tandem stance 2x 30 seconds with finger tip support  Hedgehog taps, SUE support required, 8x each LE three hedgehogs  Standing GTB row cues for sequencing 10x; very challenging for patient  Seated: GTB hamstring curl 15x each LE  GTB adduction LLE 15 LLE GTB abduction BLE 15x  GTB L df 15x RTB alternating LAQ around ankles 10x each LE    Vitals monitored throughout session    Pt educated throughout session about proper posture and technique with exercises. Improved exercise technique, movement at target joints, use of target muscles after min to mod verbal, visual, tactile cues  Focused strengthening and stabilization on affected LLE tolerated well with patient requiring less frequent rest breaks. Patient demonstrates excellent motivation throughout physical therapy session. stepping over obstacles is improving with better spatial awareness as well as muscle activation decreasing patients risk for falling. Patient will benefit from skilled physical therapy to reduce fall risk, improve strength  and mobility, and return to [facility]                      PT Education - 11/04/20 1313    Education provided Yes    Education Details exercise technique, body mechanics    Person(s) Educated Patient    Methods Explanation;Demonstration;Tactile cues;Verbal cues    Comprehension Verbalized understanding;Returned demonstration;Verbal cues required;Tactile cues required            PT Short Term Goals - 10/21/20 1534      PT SHORT TERM GOAL #1   Title After 6 weeks patient will be independent in HEP to progress strength, AMB, and balance.    Baseline 2/9: HEP compliant    Time 6    Period Weeks    Status Partially Met    Target Date 11/17/20      PT SHORT TERM GOAL #2   Title After 6 weeks pt will demonstrate improved 5xSTS hands free  chair + airex foam in <20sec    Baseline 01/25: 31s hands free without airex foam 2/9: unable to do on airex    Time 6    Period Weeks    Status On-going    Target Date 11/17/20             PT Long Term Goals - 10/21/20 1532      PT LONG TERM GOAL #1   Title Patient will reduce timed up and go to <11 seconds to reduce fall risk and demonstrate improved transfer/gait ability.    Baseline 01/25: 30.26s 2/9: 19.55 seconds with RW    Time 12    Period Weeks    Status Partially Met    Target Date 12/29/20      PT LONG TERM GOAL #2   Title Patient will demonstrate 6MWT (LRAD) >764f to demonstrate improved capacity to access the community.    Baseline 12/16: 120 ft, 01/25: 165 ft with 1 seated rest break for 2 mins 2/9: 395 ft with RW and one seated rest break    Time 12    Period Weeks    Status Partially Met    Target Date 12/29/20      PT LONG TERM GOAL #3   Title Patient will increase 10 meter walk test to >1.033m as to improve gait speed for better community ambulation and to reduce fall risk.    Baseline 01/25: 0.3877m2/9: 0.81 m/s with RW    Time 12    Period Weeks    Status Partially Met    Target Date 12/29/20      PT LONG TERM GOAL #4   Title After 12 weeks pt to demonstrate improved FOTO score >70    Baseline 01/25: 51 2/9: 55.97 %    Time 12    Period Weeks    Status Partially Met    Target Date 12/29/20      PT LONG TERM GOAL #5   Title Patient (> 60 18ars old) will complete five times sit to stand test in < 15 seconds indicating an increased LE strength and improved balance.    Baseline 01/25: 31s 2/9: hands on knees 13.09    Time 12    Period Weeks    Status Achieved                 Plan - 11/04/20 1536    Clinical Impression Statement Focused strengthening and stabilization on affected LLE tolerated well with patient requiring  less frequent rest breaks. Patient demonstrates excellent motivation throughout physical therapy session. stepping  over obstacles is improving with better spatial awareness as well as muscle activation decreasing patients risk for falling. Patient will benefit from skilled physical therapy to reduce fall risk, improve strength and mobility, and return to PLOF    Personal Factors and Comorbidities Age;Comorbidity 2;Past/Current Experience;Time since onset of injury/illness/exacerbation    Comorbidities COPD, History of Cancer, myasthenia gravis, chronic lumbar surgical history.    Examination-Activity Limitations Lift;Squat;Bend;Stairs;Stand;Transfers;Insurance claims handler;Bathing;Hygiene/Grooming;Dressing;Toileting    Examination-Participation Restrictions Yard Work;Church;Cleaning;Driving    Stability/Clinical Decision Making Evolving/Moderate complexity    Rehab Potential Good    Clinical Impairments Affecting Rehab Potential PMH    PT Frequency 2x / week    PT Duration 12 weeks    PT Treatment/Interventions ADLs/Self Care Home Management;Electrical Stimulation;Moist Heat;Traction;Ultrasound;Gait training;Stair training;Functional mobility training;Therapeutic activities;Therapeutic exercise;Balance training;Neuromuscular re-education;Patient/family education;Manual techniques;Passive range of motion;Dry needling;Joint Manipulations;Cryotherapy    PT Next Visit Plan balance, strength    PT Home Exercise Plan HEP: seated marches, seated heel raises, seated LAQ    Consulted and Agree with Plan of Care Patient           Patient will benefit from skilled therapeutic intervention in order to improve the following deficits and impairments:  Abnormal gait,Decreased balance,Decreased mobility,Decreased endurance,Difficulty walking,Hypomobility,Impaired sensation,Decreased range of motion,Impaired perceived functional ability,Decreased activity tolerance,Decreased coordination,Decreased strength,Impaired flexibility,Postural dysfunction,Pain  Visit Diagnosis: Muscle weakness (generalized)  Other  lack of coordination  Unsteadiness on feet  Chronic left-sided low back pain with left-sided sciatica     Problem List Patient Active Problem List   Diagnosis Date Noted  . Carotid stenosis, right 09/17/2020  . S/P cervical spinal fusion   . Leukocytosis   . Essential hypertension   . Anemia of chronic disease   . Postoperative pain   . Neuropathic pain   . Cervical myelopathy ([place]) 08/07/2020  . Preop cardiovascular exam 07/17/2020  . SOB (shortness of breath) on exertion 07/17/2020  . Leg weakness, bilateral 07/13/2020  . Aortic atherosclerosis ([place]) 07/09/2020  . Myalgia 10/30/2019  . PAD (peripheral artery disease) ([place]) 06/06/2019  . CKD (chronic kidney disease) stage 3, GFR 30-59 ml/min (HCC) 04/29/2019  . B12 deficiency 01/23/2019  . Left arm numbness 02/28/2018  . Neck pain 02/28/2018  . Anemia 02/02/2017  . DDD (degenerative disc disease), cervical 02/02/2017  . Hypothyroid 02/02/2017  . MRSA (methicillin resistant staph aureus) culture positive 02/02/2017  . Nocturnal hypoxia 02/02/2017  . Senile purpura ([place]) 10/03/2016  . Essential hypertension, benign 09/16/2016  . Bilateral carotid artery disease ([place]) 09/16/2016  . 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/18/2016  . 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 History of prostate cancer 12/21/2015  . Left carpal tunnel syndrome 11/11/2015  . Kidney stone on left side 07/05/2015  . Myasthenia gravis ([place]) 05/26/2015  . Long-term use of high-risk medication 10/15/2014  . Persistent cough 09/10/2014  . Pure hypercholesterolemia 07/25/2014  . Spinal stenosis, lumbar region, with neurogenic claudication 02/24/2014  . Lumbosacral stenosis with neurogenic claudication ([place]) 02/24/2014  . COPD (chronic obstructive pulmonary disease) ([place]) 01/22/2014   Janna Arch, PT, DPT   11/04/2020, 3:38 PM  [place] MAIN [facility] SERVICES [address] [place], [place], [address] Phone:  [phone]   Fax:  [phone]  Name: Francisco [name] MRN: [id number] Date of Birth: 1945/11/18

## 2020-11-04 NOTE — Therapy (Signed)
[place] MAIN [facility] SERVICES [address] [place], [place], [address] Phone: [phone]   Fax:  [phone]  Occupational Therapy Treatment  Patient Details  Name: Francisco [name] MRN: [id number] Date of Birth: 17-Jan-1946 Referring Provider (OT): Dr Annette Stable   Encounter Date: 11/04/2020   OT End of Session - 11/04/20 1359    Visit Number 7    Number of Visits 12    Date for OT Re-Evaluation 12/29/20    OT Start Time 1829    OT Stop Time 1415    OT Time Calculation (min) 28 min    Activity Tolerance Patient tolerated treatment well    Behavior During Therapy [facility] for tasks assessed/performed           Past Medical History:  Diagnosis Date  . Arthritis    lower left hip  . Atypical angina ([place])   . Bilateral hand numbness    from back surgery  . Bronchitis, chronic ([place])   . Cancer [facility])    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; Merkel cell cancer, and Basal cell cancer (twice; 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  . Carotid stenosis   . CKD (chronic kidney disease) stage 3, GFR 30-59 ml/min (HCC)   . 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 DDD (degenerative disc disease), cervical   . Hypercholesterolemia   . Hypertension   . Hypothyroidism    pt takes Levothyroxine daily  . Lumbosacral spinal stenosis   . Myasthenia gravis, adult form ([place])   . PAD (peripheral artery disease) ([place])   . Shortness of breath    Lung MD- Dr Darlin Coco  . Sleep apnea    do not use CPAP every night    Past Surgical History:  Procedure Laterality Date  . ANTERIOR CERVICAL DECOMP/DISCECTOMY FUSION  07/18/2011   Procedure: ANTERIOR CERVICAL DECOMPRESSION/DISCECTOMY FUSION 2 LEVELS;  Surgeon: Cooper Render Pool;  Location: [place] NEURO ORS;  Service: Neurosurgery;  Laterality: N/A;  cervical five-six, cervical six-seven anterior cervical discectomy and fusion  . BACK SURGERY     in [place]     01/2020 Right,  04/2020 Left  . CARDIAC CATHETERIZATION     2005 at [facility], no stents  . CAROTID PTA/STENT INTERVENTION N/A 09/17/2020   Procedure: CAROTID PTA/STENT INTERVENTION;  Surgeon: Algernon Huxley, MD;  Location: [place] CV LAB;  Service: Cardiovascular;  Laterality: N/A;  . CATARACT EXTRACTION W/PHACO Left 01/06/2020   Procedure: CATARACT EXTRACTION PHACO AND INTRAOCULAR LENS PLACEMENT (IOC) ISTENT INJ LEFT 3.81  00:33.3;  Surgeon: Eulogio Bear, MD;  Location: [place];  Service: Ophthalmology;  Laterality: Left;  . CATARACT EXTRACTION W/PHACO Right 02/03/2020   Procedure: CATARACT EXTRACTION PHACO AND INTRAOCULAR LENS PLACEMENT ([place]) RIGHT ISTENT INJ;  Surgeon: Eulogio Bear, MD;  Location: [place];  Service: Ophthalmology;  Laterality: Right;  4.29 0:35.6  . COLONOSCOPY    . HERNIA REPAIR Left    inguinal hernia repair in 1985  . LUMBAR LAMINECTOMY/DECOMPRESSION MICRODISCECTOMY Left 02/24/2014   Procedure: LUMBAR LAMINECTOMY/DECOMPRESSION MICRODISCECTOMY LUMBAR THREE-FOUR, FOUR-FIVE, LEFT FIVE-SACRAL ONE ;  Surgeon: Charlie Pitter, MD;  Location: [place] NEURO ORS;  Service: Neurosurgery;  Laterality: Left;  LUMBAR LAMINECTOMY/DECOMPRESSION MICRODISCECTOMY LUMBAR THREE-FOUR, FOUR-FIVE, LEFT FIVE-SACRAL ONE   . POSTERIOR CERVICAL FUSION/FORAMINOTOMY N/A 08/07/2020   Procedure: C3-6 POSTERIOR FUSION WITH DECOMPRESSION;  Surgeon: Meade Maw, MD;  Location: [facility] ORS;  Service: Neurosurgery;  Laterality: N/A;  .  PROSTATECTOMY  8/14   [facility] Dr Mare Ferrari     There were no vitals filed for this visit.   Subjective Assessment - 11/04/20 1357    Subjective  Pt. reports that he needs to leave early today    Pertinent History Pt is a 75 y/o M with hx of cervical myelopathy & admitted on 08/07/20 for scheduled C3-6 Posterior fusion with decompression by Dr. Izora Ribas. PMH: atypical angina, PAD, carotid stenosis, HTN, HLD, COPD, Myasthenia gravis, hypothyroidism, CKD-III, OSA  requiring nocturnal PAP therapy, OA, cervical DDD, lumbosacral spinal stenosis, chronic DOE 2/2 COPD.  Pt went to IP rehab and was discharged on 08/20/2020 with orders for OP OT and PT.  History of carpal tunnel bilaterally.    Currently in Pain? No/denies          OT TREATMENT   Therapeutic Exercise:  Pt. performed 2# dowel ex. for UE strengthening secondary to weakness. Bilateral shoulder flexion, chest press, circular patterns, and elbow flexion/extension were performed 1 set 15 reps each. 3# dumbbell ex. for elbow flexion and extension,  2# for forearm supination/pronation, wrist flexion/extension for 1 set 10-15 reps reach. Pt. requires rest breaks and verbal cues for proper technique.  Pt. reported having to leave the session early today due to work/business related issues. Pt. was able to tolerate 1 set of each exercise with rest breaks. Pt continues to work on improving UE strength, and [facility] skills in order to improve overall ADL, and IADL functioning.                           OT Education - 11/04/20 1359    Education Details [place], strength    Person(s) Educated Patient    Methods Explanation;Demonstration;Handout;Verbal cues;Tactile cues    Comprehension Verbalized understanding;Returned demonstration;Verbal cues required               OT Long Term Goals - 10/06/20 1113      OT LONG TERM GOAL #1   Title Patient to be independent with home exercise program for strengthening and coordination skills.    Baseline decreased strength and coordination bilaterally after cervical surgery    Time 6    Period Weeks    Status On-going    Target Date 12/29/20      OT LONG TERM GOAL #2   Title Patient will improve grip strength bilaterally by 8# to hold objects securely without dropping.    Baseline Improving grip strength. Refer to [employer]    Time 12    Period Weeks    Status On-going    Target Date 12/29/20      OT LONG TERM GOAL #3   Title  Patient will perform self care tasks with modified independence.    Baseline Independent with morning care    Status Achieved      OT LONG TERM GOAL #4   Title Pt will complete light homemaking tasks with modified independence.    Baseline wife conitnues to assist    Time 12    Period Weeks    Status On-going    Target Date 12/29/20      OT LONG TERM GOAL #5   Title Patient will improve bilateral shoulder ROM and strength to place items on shelves at shoulder height with modified independence.    Baseline Pt. is able to reach items on shelves.    Status Achieved      OT LONG TERM GOAL #6  Title Pt will improve left UE coordination by decreasing time on 9 hole peg test by 10 secs to be able to manipulate and complete buttons with modified independence.    Baseline Pt. continues to have difficulty.refer to flow sheet for measurements    Time 12    Status On-going    Target Date 12/29/20                 Plan - 11/04/20 1400    Clinical Impression Statement Pt. reported having to leave the session early today due to work/business related issues. Pt. was able to tolerate 1 set of each exercise with rest breaks. Pt continues to work on improving UE strength, and [facility] skills in order to improve overall ADL, and IADL functioning.   OT Occupational Profile and History Detailed Assessment- Review of Records and additional review of physical, cognitive, psychosocial history related to current functional performance    Occupational performance deficits (Please refer to evaluation for details): ADL's;IADL's;Leisure;Work    Marketing executive / Function / Physical Skills ADL;Flexibility;ROM;UE functional use;FMC;Dexterity;Sensation;Strength;IADL;Coordination;Mobility;Decreased knowledge of use of DME;Balance;Endurance    Psychosocial Skills Environmental  Adaptations;Habits;Routines and Behaviors    Rehab Potential Good    Clinical Decision Making Several treatment options, min-mod task  modification necessary    Comorbidities Affecting Occupational Performance: Presence of comorbidities impacting occupational performance    Comorbidities impacting occupational performance description: age, carpal tunnel history, COPD, chronic lumbar surgical history, history of myasthenia gravis, has a full time job and working currently, unable to drive    Modification or Assistance to Complete Evaluation  No modification of tasks or assist necessary to complete eval    OT Frequency 2x / week    OT Duration 6 weeks    OT Treatment/Interventions Self-care/ADL training;Therapeutic exercise;Neuromuscular education;Patient/family education;Therapeutic activities;Cryotherapy;Functional Mobility Training;DME and/or AE instruction;Balance training;Moist Heat    Consulted and Agree with Plan of Care Patient           Patient will benefit from skilled therapeutic intervention in order to improve the following deficits and impairments:   Body Structure / Function / Physical Skills: ADL,Flexibility,ROM,UE functional use,FMC,Dexterity,Sensation,Strength,IADL,Coordination,Mobility,Decreased knowledge of use of DME,Balance,Endurance   Psychosocial Skills: Environmental  Adaptations,Habits,Routines and Behaviors   Visit Diagnosis: Muscle weakness (generalized)  Other lack of coordination    Problem List Patient Active Problem List   Diagnosis Date Noted  . Carotid stenosis, right 09/17/2020  . S/P cervical spinal fusion   . Leukocytosis   . Essential hypertension   . Anemia of chronic disease   . Postoperative pain   . Neuropathic pain   . Cervical myelopathy ([place]) 08/07/2020  . Preop cardiovascular exam 07/17/2020  . SOB (shortness of breath) on exertion 07/17/2020  . Leg weakness, bilateral 07/13/2020  . Aortic atherosclerosis ([place]) 07/09/2020  . Myalgia 10/30/2019  . PAD (peripheral artery disease) ([place]) 06/06/2019  . CKD (chronic kidney disease) stage 3, GFR 30-59 ml/min (HCC)  04/29/2019  . B12 deficiency 01/23/2019  . Left arm numbness 02/28/2018  . Neck pain 02/28/2018  . Anemia 02/02/2017  . DDD (degenerative disc disease), cervical 02/02/2017  . Hypothyroid 02/02/2017  . MRSA (methicillin resistant staph aureus) culture positive 02/02/2017  . Nocturnal hypoxia 02/02/2017  . Senile purpura ([place]) 10/03/2016  . Essential hypertension, benign 09/16/2016  . Bilateral carotid artery disease ([place]) 09/16/2016  . 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/18/2016  . 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 History of prostate cancer 12/21/2015  . Left carpal tunnel syndrome 11/11/2015  .  Kidney stone on left side 07/05/2015  . Myasthenia gravis ([place]) 05/26/2015  . Long-term use of high-risk medication 10/15/2014  . Persistent cough 09/10/2014  . Pure hypercholesterolemia 07/25/2014  . Spinal stenosis, lumbar region, with neurogenic claudication 02/24/2014  . Lumbosacral stenosis with neurogenic claudication ([place]) 02/24/2014  . COPD (chronic obstructive pulmonary disease) ([place]) 01/22/2014    Harrel Carina, MS, OTR/L 11/04/2020, 2:14 PM  [place] MAIN [facility] SERVICES [address] [place], [place], [address] Phone: [phone]   Fax:  [phone]  Name: Francisco [name] MRN: [id number] Date of Birth: 12-18-1945

## 2020-11-10 ENCOUNTER — Ambulatory Visit: Payer: Medicare Other | Admitting: Occupational Therapy

## 2020-11-10 ENCOUNTER — Ambulatory Visit: Payer: Medicare Other

## 2020-11-12 ENCOUNTER — Ambulatory Visit: Payer: Medicare Other | Attending: Internal Medicine

## 2020-11-12 ENCOUNTER — Other Ambulatory Visit: Payer: Self-pay

## 2020-11-12 VITALS — BP 128/68 | HR 71

## 2020-11-12 DIAGNOSIS — R278 Other lack of coordination: Secondary | ICD-10-CM | POA: Insufficient documentation

## 2020-11-12 DIAGNOSIS — M25632 Stiffness of left wrist, not elsewhere classified: Secondary | ICD-10-CM | POA: Insufficient documentation

## 2020-11-12 DIAGNOSIS — M792 Neuralgia and neuritis, unspecified: Secondary | ICD-10-CM | POA: Insufficient documentation

## 2020-11-12 DIAGNOSIS — G8929 Other chronic pain: Secondary | ICD-10-CM | POA: Diagnosis present

## 2020-11-12 DIAGNOSIS — R2681 Unsteadiness on feet: Secondary | ICD-10-CM | POA: Diagnosis present

## 2020-11-12 DIAGNOSIS — M5442 Lumbago with sciatica, left side: Secondary | ICD-10-CM | POA: Diagnosis present

## 2020-11-12 DIAGNOSIS — M6281 Muscle weakness (generalized): Secondary | ICD-10-CM | POA: Diagnosis present

## 2020-11-12 DIAGNOSIS — G959 Disease of spinal cord, unspecified: Secondary | ICD-10-CM | POA: Insufficient documentation

## 2020-11-12 NOTE — Therapy (Signed)
[place] MAIN [facility] SERVICES [address] [place], [place], [address] Phone: [phone]   Fax:  [phone]  Physical Therapy Treatment  Patient Details  Name: Francisco [name] MRN: [id number] Date of Birth: 09/18/1945 Referring Provider (PT): Lauraine Rinne, PA-C   Encounter Date: 11/12/2020   PT End of Session - 11/12/20 1439    Visit Number 14    Number of Visits 25    Date for PT Re-Evaluation 12/29/20    Authorization Type [employer] Medicare    Authorization Time Period 08/25/20-11/17/20    PT Start Time 3382    PT Stop Time 1431    PT Time Calculation (min) 43 min    Equipment Utilized During Treatment Gait belt    Activity Tolerance Patient tolerated treatment well;No increased pain;Patient limited by fatigue    Behavior During Therapy [facility] for tasks assessed/performed           Past Medical History:  Diagnosis Date  . Arthritis    lower left hip  . Atypical angina ([place])   . Bilateral hand numbness    from back surgery  . Bronchitis, chronic ([place])   . Cancer [facility])    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; Merkel cell cancer, and Basal cell cancer (twice; 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  . Carotid stenosis   . CKD (chronic kidney disease) stage 3, GFR 30-59 ml/min (HCC)   . 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 DDD (degenerative disc disease), cervical   . Hypercholesterolemia   . Hypertension   . Hypothyroidism    pt takes Levothyroxine daily  . Lumbosacral spinal stenosis   . Myasthenia gravis, adult form ([place])   . PAD (peripheral artery disease) ([place])   . Shortness of breath    Lung MD- Dr Darlin Coco  . Sleep apnea    do not use CPAP every night    Past Surgical History:  Procedure Laterality Date  . ANTERIOR CERVICAL DECOMP/DISCECTOMY FUSION  07/18/2011   Procedure: ANTERIOR CERVICAL DECOMPRESSION/DISCECTOMY FUSION 2 LEVELS;  Surgeon: Cooper Render Pool;  Location: [place] NEURO ORS;  Service: Neurosurgery;   Laterality: N/A;  cervical five-six, cervical six-seven anterior cervical discectomy and fusion  . BACK SURGERY     in [place]     01/2020 Right, 04/2020 Left  . CARDIAC CATHETERIZATION     2005 at [facility], no stents  . CAROTID PTA/STENT INTERVENTION N/A 09/17/2020   Procedure: CAROTID PTA/STENT INTERVENTION;  Surgeon: Algernon Huxley, MD;  Location: [place] CV LAB;  Service: Cardiovascular;  Laterality: N/A;  . CATARACT EXTRACTION W/PHACO Left 01/06/2020   Procedure: CATARACT EXTRACTION PHACO AND INTRAOCULAR LENS PLACEMENT (IOC) ISTENT INJ LEFT 3.81  00:33.3;  Surgeon: Eulogio Bear, MD;  Location: [place];  Service: Ophthalmology;  Laterality: Left;  . CATARACT EXTRACTION W/PHACO Right 02/03/2020   Procedure: CATARACT EXTRACTION PHACO AND INTRAOCULAR LENS PLACEMENT ([place]) RIGHT ISTENT INJ;  Surgeon: Eulogio Bear, MD;  Location: [place];  Service: Ophthalmology;  Laterality: Right;  4.29 0:35.6  . COLONOSCOPY    . HERNIA REPAIR Left    inguinal hernia repair in 1985  . LUMBAR LAMINECTOMY/DECOMPRESSION MICRODISCECTOMY Left 02/24/2014   Procedure: LUMBAR LAMINECTOMY/DECOMPRESSION MICRODISCECTOMY LUMBAR THREE-FOUR, FOUR-FIVE, LEFT FIVE-SACRAL ONE ;  Surgeon: Charlie Pitter, MD;  Location: [place] NEURO ORS;  Service: Neurosurgery;  Laterality: Left;  LUMBAR LAMINECTOMY/DECOMPRESSION MICRODISCECTOMY LUMBAR THREE-FOUR, FOUR-FIVE, LEFT FIVE-SACRAL ONE   .  POSTERIOR CERVICAL FUSION/FORAMINOTOMY N/A 08/07/2020   Procedure: C3-6 POSTERIOR FUSION WITH DECOMPRESSION;  Surgeon: Meade Maw, MD;  Location: [facility] ORS;  Service: Neurosurgery;  Laterality: N/A;  . PROSTATECTOMY  8/14   [facility] Dr Mare Ferrari     Vitals:   11/12/20 1448  BP: 128/68  Pulse: 71  SpO2: 97%     Subjective Assessment - 11/12/20 1438    Subjective Pt reports he has [employer] appointment to check his PSA after therapy today. Pt reports no pain. Pt states  he just purchased a quad cane to use inside his house.  Pt reports no falls.    Pertinent History Francisco [name] is a 77yoM referred to OPPT neuro s/p myelopathy s/p cervical decompression and fusion. Pt sustained several months of general decline in mobility prior to surgical decompression. Pt went to inpatient rehab after hospitalization. [facility].  Pt underwent C3-6 decompression, fusion on 11/26. PMH: myasthenia gravis, 2012 ACDF 5/6, 6/7; OSA, hypothyroidism, HTN, COPD, multiple back surgeries, 2021 carpal tunnel release, referred to OPOT, but only seen for evaluation. 8/10 OPPT evaluation for LSS c radiating pain into the left hip, only completed evaluation session. September - November progressive decline in leg strength and tolerance to upright activity. Also noted a decline in tool manipulation for ADL (pen and meal utensil). Seen by OT and PT as an outpatient for other potential etiology (Carpal tunnel syndrome, and lumbar spinal stenosis), but eventually identified to be related to cervical myelopathy. Several months prior has no limitations in mobility or function. Pt scheduled for Jan 6th: Carotid endarterectomy c Dr. Leotis Pain. Pt has a long history of lumbar DJD, s/p L5/S1 decompression discectomy x2, L5 nerve root injections. Most recent Lumbar MRI (11/10/18) revealin gof advanced formaminal stenosis of L3/4, L4/5, L5/S1. Pt is has had LLE neurological weakness and pain since 2015 and has been followed by neurosurgery.    Limitations Walking;Lifting;Standing;House hold activities    How long can you sit comfortably? Not limited    How long can you stand comfortably? 3-5 minutes    How long can you walk comfortably? 10 minutes    Patient Stated Goals return to functional baseline status of Summer 2021.    Currently in Pain? No/denies           TODAY  Therapeutic Exercise: Seated: GTB hamstring curls 20x each LE  Seated adductor squeezes with ball 20x GTB  abduction BLE 20x, 10x  LAQ with 4# AW, 1x10 R LE, 1x10 L LE, 3# AW 1x10 R LE, 1x4 L LE    Neuromuscular Re-Education  Airex WBOS EO/EC x several minutes each; continued increased weight shift to right, cueing to equal WB, pt able to weight-shift to L LE. Airex Tandem Balance 30"x1 each LE and UE support Standing cone taps x multiple reps, greatest difficulty lifting L LE with reaching top of cones and without knocking them over. UUE support   Pt educated throughout session about proper posture and technique with exercises. Improved exercise technique, movement at target joints, use of target muscles after min to mod verbal, visual, tactile cues       PT Education - 11/12/20 1439    Education provided Yes    Education Details technique with standing cone taps, body mechanics    Person(s) Educated Patient    Methods Explanation;Demonstration;Verbal cues    Comprehension Returned demonstration;Verbalized understanding            PT Short Term Goals - 10/21/20 1534  PT SHORT TERM GOAL #1   Title After 6 weeks patient will be independent in HEP to progress strength, AMB, and balance.    Baseline 2/9: HEP compliant    Time 6    Period Weeks    Status Partially Met    Target Date 11/17/20      PT SHORT TERM GOAL #2   Title After 6 weeks pt will demonstrate improved 5xSTS hands free chair + airex foam in <20sec    Baseline 01/25: 31s hands free without airex foam 2/9: unable to do on airex    Time 6    Period Weeks    Status On-going    Target Date 11/17/20             PT Long Term Goals - 10/21/20 1532      PT LONG TERM GOAL #1   Title Patient will reduce timed up and go to <11 seconds to reduce fall risk and demonstrate improved transfer/gait ability.    Baseline 01/25: 30.26s 2/9: 19.55 seconds with RW    Time 12    Period Weeks    Status Partially Met    Target Date 12/29/20      PT LONG TERM GOAL #2   Title Patient will demonstrate 6MWT (LRAD) >722f to  demonstrate improved capacity to access the community.    Baseline 12/16: 120 ft, 01/25: 165 ft with 1 seated rest break for 2 mins 2/9: 395 ft with RW and one seated rest break    Time 12    Period Weeks    Status Partially Met    Target Date 12/29/20      PT LONG TERM GOAL #3   Title Patient will increase 10 meter walk test to >1.074m as to improve gait speed for better community ambulation and to reduce fall risk.    Baseline 01/25: 0.3853m2/9: 0.81 m/s with RW    Time 12    Period Weeks    Status Partially Met    Target Date 12/29/20      PT LONG TERM GOAL #4   Title After 12 weeks pt to demonstrate improved FOTO score >70    Baseline 01/25: 51 2/9: 55.97 %    Time 12    Period Weeks    Status Partially Met    Target Date 12/29/20      PT LONG TERM GOAL #5   Title Patient (> 60 54ars old) will complete five times sit to stand test in < 15 seconds indicating an increased LE strength and improved balance.    Baseline 01/25: 31s 2/9: hands on knees 13.09    Time 12    Period Weeks    Status Achieved                 Plan - 11/12/20 1440    Clinical Impression Statement Pt LLE quickly fatigues with standing balance exercises, particularly with cone taps, where pt had multiple instances of knocking over cones and using UE to assist LLE into greater hip flexion. However, pt does demonstrate some progress, and was able to maintain SLB for multiple minutes with intermittent UE assist. Pt will benefit from further skilled therapy to improve BLE strength and balance in order to reduce fall risk.    Personal Factors and Comorbidities Age;Comorbidity 2;Past/Current Experience;Time since onset of injury/illness/exacerbation    Comorbidities COPD, History of Cancer, myasthenia gravis, chronic lumbar surgical history.    Examination-Activity Limitations Lift;Squat;Bend;Stairs;Stand;Transfers;LocInsurance claims handlerthing;Hygiene/Grooming;Dressing;Toileting  Examination-Participation Restrictions Yard Work;Church;Cleaning;Driving    Stability/Clinical Decision Making Evolving/Moderate complexity    Rehab Potential Good    Clinical Impairments Affecting Rehab Potential PMH    PT Frequency 2x / week    PT Duration 12 weeks    PT Treatment/Interventions ADLs/Self Care Home Management;Electrical Stimulation;Moist Heat;Traction;Ultrasound;Gait training;Stair training;Functional mobility training;Therapeutic activities;Therapeutic exercise;Balance training;Neuromuscular re-education;Patient/family education;Manual techniques;Passive range of motion;Dry needling;Joint Manipulations;Cryotherapy    PT Next Visit Plan balance, strength; progress standing balance    PT Home Exercise Plan HEP: seated marches, seated heel raises, seated LAQ    Consulted and Agree with Plan of Care Patient           Patient will benefit from skilled therapeutic intervention in order to improve the following deficits and impairments:  Abnormal gait,Decreased balance,Decreased mobility,Decreased endurance,Difficulty walking,Hypomobility,Impaired sensation,Decreased range of motion,Impaired perceived functional ability,Decreased activity tolerance,Decreased coordination,Decreased strength,Impaired flexibility,Postural dysfunction,Pain  Visit Diagnosis: Muscle weakness (generalized)  Unsteadiness on feet  Other lack of coordination     Problem List Patient Active Problem List   Diagnosis Date Noted  . Carotid stenosis, right 09/17/2020  . S/P cervical spinal fusion   . Leukocytosis   . Essential hypertension   . Anemia of chronic disease   . Postoperative pain   . Neuropathic pain   . Cervical myelopathy ([place]) 08/07/2020  . Preop cardiovascular exam 07/17/2020  . SOB (shortness of breath) on exertion 07/17/2020  . Leg weakness, bilateral 07/13/2020  . Aortic atherosclerosis ([place]) 07/09/2020  . Myalgia 10/30/2019  . PAD (peripheral artery disease) ([place])  06/06/2019  . CKD (chronic kidney disease) stage 3, GFR 30-59 ml/min (HCC) 04/29/2019  . B12 deficiency 01/23/2019  . Left arm numbness 02/28/2018  . Neck pain 02/28/2018  . Anemia 02/02/2017  . DDD (degenerative disc disease), cervical 02/02/2017  . Hypothyroid 02/02/2017  . MRSA (methicillin resistant staph aureus) culture positive 02/02/2017  . Nocturnal hypoxia 02/02/2017  . Senile purpura ([place]) 10/03/2016  . Essential hypertension, benign 09/16/2016  . Bilateral carotid artery disease ([place]) 09/16/2016  . 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/18/2016  . 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 History of prostate cancer 12/21/2015  . Left carpal tunnel syndrome 11/11/2015  . Kidney stone on left side 07/05/2015  . Myasthenia gravis ([place]) 05/26/2015  . Long-term use of high-risk medication 10/15/2014  . Persistent cough 09/10/2014  . Pure hypercholesterolemia 07/25/2014  . Spinal stenosis, lumbar region, with neurogenic claudication 02/24/2014  . Lumbosacral stenosis with neurogenic claudication ([place]) 02/24/2014  . COPD (chronic obstructive pulmonary disease) ([place]) 01/22/2014   Ricard Dillon PT, DPT 11/12/2020, 2:53 PM  [place] MAIN [facility] SERVICES [address] [place], [place], [address] Phone: [phone]   Fax:  [phone]  Name: Francisco [name] MRN: [id number] Date of Birth: [birth year]

## 2020-11-17 ENCOUNTER — Ambulatory Visit: Payer: Medicare Other

## 2020-11-17 ENCOUNTER — Other Ambulatory Visit: Payer: Self-pay

## 2020-11-17 DIAGNOSIS — R2681 Unsteadiness on feet: Secondary | ICD-10-CM

## 2020-11-17 DIAGNOSIS — M6281 Muscle weakness (generalized): Secondary | ICD-10-CM | POA: Diagnosis not present

## 2020-11-17 DIAGNOSIS — R278 Other lack of coordination: Secondary | ICD-10-CM

## 2020-11-17 DIAGNOSIS — G8929 Other chronic pain: Secondary | ICD-10-CM

## 2020-11-17 NOTE — Therapy (Signed)
[place] MAIN [facility] SERVICES [address] [place], [place], [address] Phone: [phone]   Fax:  [phone]  Physical Therapy Treatment  Patient Details  Name: Francisco [name] MRN: [id number] Date of Birth: 1946/03/14 Referring Provider (PT): Lauraine Rinne, PA-C   Encounter Date: 11/17/2020   PT End of Session - 11/17/20 2203    Visit Number 15    Number of Visits 25    Date for PT Re-Evaluation 12/29/20    Authorization Type [employer] Medicare    Authorization Time Period 08/25/20-11/17/20    PT Start Time 1330    PT Stop Time 1414    PT Time Calculation (min) 44 min    Equipment Utilized During Treatment Gait belt    Activity Tolerance Patient tolerated treatment well;No increased pain;Patient limited by fatigue    Behavior During Therapy [facility] for tasks assessed/performed           Past Medical History:  Diagnosis Date  . Arthritis    lower left hip  . Atypical angina ([place])   . Bilateral hand numbness    from back surgery  . Bronchitis, chronic ([place])   . Cancer [facility])    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; Merkel cell cancer, and Basal cell cancer (twice; 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  . Carotid stenosis   . CKD (chronic kidney disease) stage 3, GFR 30-59 ml/min (HCC)   . 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 DDD (degenerative disc disease), cervical   . Hypercholesterolemia   . Hypertension   . Hypothyroidism    pt takes Levothyroxine daily  . Lumbosacral spinal stenosis   . Myasthenia gravis, adult form ([place])   . PAD (peripheral artery disease) ([place])   . Shortness of breath    Lung MD- Dr Darlin Coco  . Sleep apnea    do not use CPAP every night    Past Surgical History:  Procedure Laterality Date  . ANTERIOR CERVICAL DECOMP/DISCECTOMY FUSION  07/18/2011   Procedure: ANTERIOR CERVICAL DECOMPRESSION/DISCECTOMY FUSION 2 LEVELS;  Surgeon: Cooper Render Pool;  Location: [place] NEURO ORS;  Service: Neurosurgery;   Laterality: N/A;  cervical five-six, cervical six-seven anterior cervical discectomy and fusion  . BACK SURGERY     in [place]     01/2020 Right, 04/2020 Left  . CARDIAC CATHETERIZATION     2005 at [facility], no stents  . CAROTID PTA/STENT INTERVENTION N/A 09/17/2020   Procedure: CAROTID PTA/STENT INTERVENTION;  Surgeon: Algernon Huxley, MD;  Location: [place] CV LAB;  Service: Cardiovascular;  Laterality: N/A;  . CATARACT EXTRACTION W/PHACO Left 01/06/2020   Procedure: CATARACT EXTRACTION PHACO AND INTRAOCULAR LENS PLACEMENT (IOC) ISTENT INJ LEFT 3.81  00:33.3;  Surgeon: Eulogio Bear, MD;  Location: [place];  Service: Ophthalmology;  Laterality: Left;  . CATARACT EXTRACTION W/PHACO Right 02/03/2020   Procedure: CATARACT EXTRACTION PHACO AND INTRAOCULAR LENS PLACEMENT ([place]) RIGHT ISTENT INJ;  Surgeon: Eulogio Bear, MD;  Location: [place];  Service: Ophthalmology;  Laterality: Right;  4.29 0:35.6  . COLONOSCOPY    . HERNIA REPAIR Left    inguinal hernia repair in 1985  . LUMBAR LAMINECTOMY/DECOMPRESSION MICRODISCECTOMY Left 02/24/2014   Procedure: LUMBAR LAMINECTOMY/DECOMPRESSION MICRODISCECTOMY LUMBAR THREE-FOUR, FOUR-FIVE, LEFT FIVE-SACRAL ONE ;  Surgeon: Charlie Pitter, MD;  Location: [place] NEURO ORS;  Service: Neurosurgery;  Laterality: Left;  LUMBAR LAMINECTOMY/DECOMPRESSION MICRODISCECTOMY LUMBAR THREE-FOUR, FOUR-FIVE, LEFT FIVE-SACRAL ONE   .  POSTERIOR CERVICAL FUSION/FORAMINOTOMY N/A 08/07/2020   Procedure: C3-6 POSTERIOR FUSION WITH DECOMPRESSION;  Surgeon: Meade Maw, MD;  Location: [facility] ORS;  Service: Neurosurgery;  Laterality: N/A;  . PROSTATECTOMY  8/14   [facility] Dr Mare Ferrari     There were no vitals filed for this visit.   Subjective Assessment - 11/17/20 1341    Subjective Patient reports doing okay and reports compliant with HEP. Denies any pain today. He also denies any falls.    Pertinent History  Francisco [name] is a 42yoM referred to OPPT neuro s/p myelopathy s/p cervical decompression and fusion. Pt sustained several months of general decline in mobility prior to surgical decompression. Pt went to inpatient rehab after hospitalization. [facility].  Pt underwent C3-6 decompression, fusion on 11/26. PMH: myasthenia gravis, 2012 ACDF 5/6, 6/7; OSA, hypothyroidism, HTN, COPD, multiple back surgeries, 2021 carpal tunnel release, referred to OPOT, but only seen for evaluation. 8/10 OPPT evaluation for LSS c radiating pain into the left hip, only completed evaluation session. September - November progressive decline in leg strength and tolerance to upright activity. Also noted a decline in tool manipulation for ADL (pen and meal utensil). Seen by OT and PT as an outpatient for other potential etiology (Carpal tunnel syndrome, and lumbar spinal stenosis), but eventually identified to be related to cervical myelopathy. Several months prior has no limitations in mobility or function. Pt scheduled for Jan 6th: Carotid endarterectomy c Dr. Leotis Pain. Pt has a long history of lumbar DJD, s/p L5/S1 decompression discectomy x2, L5 nerve root injections. Most recent Lumbar MRI (11/10/18) revealin gof advanced formaminal stenosis of L3/4, L4/5, L5/S1. Pt is has had LLE neurological weakness and pain since 2015 and has been followed by neurosurgery.    Limitations Walking;Lifting;Standing;House hold activities    How long can you sit comfortably? Not limited    How long can you stand comfortably? 3-5 minutes    How long can you walk comfortably? 10 minutes    Patient Stated Goals return to functional baseline status of Summer 2021.    Currently in Pain? No/denies            NuStep LE only- Instructed in how to operate machine including seat and LE placement- Level 2  For 6 min 40 sec to achieve 0.25 mi.   Therapeutic Exercise: Seated: GTB hamstring curls 20x each LE  Seated adductor  squeezes with ball (hold 5 sec) - VC to count out loud for proper breathing.  GTB abduction BLE - lifting LE up and over green hedgehog x 10 reps.  (VC to maneuver LE over hedgehog disc)  LAQ with 4# AW x 10 reps BLE- Increased difficulty with Left LE with fatigue- able to complete with max exertion today.   Neuromuscular Re-Education  KORE balance activities:  Static standing- Distance from target score= 860, 790, 910 Patient with difficulty weight shifting to left LE but was able to see the feedback from video screen but stated too fatigued in left LE to continue.     Pt educated throughout session about proper posture and technique with exercises. Improved exercise technique, movement at target joints, use of target muscles after min to mod verbal, visual, tactile cues                            PT Education - 11/17/20 2202    Education provided Yes    Education Details Specific exercises and balance  techniques    Person(s) Educated Patient    Methods Explanation;Demonstration;Tactile cues;Verbal cues    Comprehension Verbalized understanding;Need further instruction;Returned demonstration;Verbal cues required;Tactile cues required            PT Short Term Goals - 10/21/20 1534      PT SHORT TERM GOAL #1   Title After 6 weeks patient will be independent in HEP to progress strength, AMB, and balance.    Baseline 2/9: HEP compliant    Time 6    Period Weeks    Status Partially Met    Target Date 11/17/20      PT SHORT TERM GOAL #2   Title After 6 weeks pt will demonstrate improved 5xSTS hands free chair + airex foam in <20sec    Baseline 01/25: 31s hands free without airex foam 2/9: unable to do on airex    Time 6    Period Weeks    Status On-going    Target Date 11/17/20             PT Long Term Goals - 10/21/20 1532      PT LONG TERM GOAL #1   Title Patient will reduce timed up and go to <11 seconds to reduce fall risk and demonstrate  improved transfer/gait ability.    Baseline 01/25: 30.26s 2/9: 19.55 seconds with RW    Time 12    Period Weeks    Status Partially Met    Target Date 12/29/20      PT LONG TERM GOAL #2   Title Patient will demonstrate 6MWT (LRAD) >749f to demonstrate improved capacity to access the community.    Baseline 12/16: 120 ft, 01/25: 165 ft with 1 seated rest break for 2 mins 2/9: 395 ft with RW and one seated rest break    Time 12    Period Weeks    Status Partially Met    Target Date 12/29/20      PT LONG TERM GOAL #3   Title Patient will increase 10 meter walk test to >1.011m as to improve gait speed for better community ambulation and to reduce fall risk.    Baseline 01/25: 0.3825m2/9: 0.81 m/s with RW    Time 12    Period Weeks    Status Partially Met    Target Date 12/29/20      PT LONG TERM GOAL #4   Title After 12 weeks pt to demonstrate improved FOTO score >70    Baseline 01/25: 51 2/9: 55.97 %    Time 12    Period Weeks    Status Partially Met    Target Date 12/29/20      PT LONG TERM GOAL #5   Title Patient (> 60 38ars old) will complete five times sit to stand test in < 15 seconds indicating an increased LE strength and improved balance.    Baseline 01/25: 31s 2/9: hands on knees 13.09    Time 12    Period Weeks    Status Achieved                 Plan - 11/17/20 2207    Clinical Impression Statement Patient performed well with Nustep without any report of pain today. He was challenged with static balance activities today with decreased Left LE weight shifting. Pt will benefit from further skilled therapy to improve BLE strength and balance in order to reduce fall risk.    Personal Factors and Comorbidities Age;Comorbidity 2;Past/Current Experience;Time since onset  of injury/illness/exacerbation    Comorbidities COPD, History of Cancer, myasthenia gravis, chronic lumbar surgical history.    Examination-Activity Limitations  Lift;Squat;Bend;Stairs;Stand;Transfers;Insurance claims handler;Bathing;Hygiene/Grooming;Dressing;Toileting    Examination-Participation Restrictions Yard Work;Church;Cleaning;Driving    Stability/Clinical Decision Making Evolving/Moderate complexity    Rehab Potential Good    Clinical Impairments Affecting Rehab Potential PMH    PT Frequency 2x / week    PT Duration 12 weeks    PT Treatment/Interventions ADLs/Self Care Home Management;Electrical Stimulation;Moist Heat;Traction;Ultrasound;Gait training;Stair training;Functional mobility training;Therapeutic activities;Therapeutic exercise;Balance training;Neuromuscular re-education;Patient/family education;Manual techniques;Passive range of motion;Dry needling;Joint Manipulations;Cryotherapy    PT Next Visit Plan balance, strength; progress standing balance    PT Home Exercise Plan HEP: seated marches, seated heel raises, seated LAQ    Consulted and Agree with Plan of Care Patient           Patient will benefit from skilled therapeutic intervention in order to improve the following deficits and impairments:  Abnormal gait,Decreased balance,Decreased mobility,Decreased endurance,Difficulty walking,Hypomobility,Impaired sensation,Decreased range of motion,Impaired perceived functional ability,Decreased activity tolerance,Decreased coordination,Decreased strength,Impaired flexibility,Postural dysfunction,Pain  Visit Diagnosis: Muscle weakness (generalized)  Unsteadiness on feet  Other lack of coordination  Chronic left-sided low back pain with left-sided sciatica     Problem List Patient Active Problem List   Diagnosis Date Noted  . Carotid stenosis, right 09/17/2020  . S/P cervical spinal fusion   . Leukocytosis   . Essential hypertension   . Anemia of chronic disease   . Postoperative pain   . Neuropathic pain   . Cervical myelopathy ([place]) 08/07/2020  . Preop cardiovascular exam 07/17/2020  . SOB (shortness of  breath) on exertion 07/17/2020  . Leg weakness, bilateral 07/13/2020  . Aortic atherosclerosis ([place]) 07/09/2020  . Myalgia 10/30/2019  . PAD (peripheral artery disease) ([place]) 06/06/2019  . CKD (chronic kidney disease) stage 3, GFR 30-59 ml/min (HCC) 04/29/2019  . B12 deficiency 01/23/2019  . Left arm numbness 02/28/2018  . Neck pain 02/28/2018  . Anemia 02/02/2017  . DDD (degenerative disc disease), cervical 02/02/2017  . Hypothyroid 02/02/2017  . MRSA (methicillin resistant staph aureus) culture positive 02/02/2017  . Nocturnal hypoxia 02/02/2017  . Senile purpura ([place]) 10/03/2016  . Essential hypertension, benign 09/16/2016  . Bilateral carotid artery disease ([place]) 09/16/2016  . 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/18/2016  . 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 History of prostate cancer 12/21/2015  . Left carpal tunnel syndrome 11/11/2015  . Kidney stone on left side 07/05/2015  . Myasthenia gravis ([place]) 05/26/2015  . Long-term use of high-risk medication 10/15/2014  . Persistent cough 09/10/2014  . Pure hypercholesterolemia 07/25/2014  . Spinal stenosis, lumbar region, with neurogenic claudication 02/24/2014  . Lumbosacral stenosis with neurogenic claudication ([place]) 02/24/2014  . COPD (chronic obstructive pulmonary disease) ([place]) 01/22/2014    Lewis Moccasin, PT 11/18/2020, 7:58 AM  [place] MAIN [facility] SERVICES [address] [place], [place], [address] Phone: [phone]   Fax:  [phone]  Name: Francisco [name] MRN: [id number] Date of Birth: May 14, 1946

## 2020-11-19 ENCOUNTER — Ambulatory Visit: Payer: Medicare Other

## 2020-11-19 ENCOUNTER — Other Ambulatory Visit: Payer: Self-pay

## 2020-11-19 DIAGNOSIS — M6281 Muscle weakness (generalized): Secondary | ICD-10-CM

## 2020-11-19 DIAGNOSIS — R2681 Unsteadiness on feet: Secondary | ICD-10-CM

## 2020-11-19 DIAGNOSIS — R278 Other lack of coordination: Secondary | ICD-10-CM

## 2020-11-19 NOTE — Therapy (Signed)
[place] MAIN [facility] SERVICES [address] [place], [place], [address] Phone: [phone]   Fax:  [phone]  Physical Therapy Treatment  Patient Details  Name: Francisco [name] MRN: [id number] Date of Birth: [birth year] Referring Provider (PT): Lauraine Rinne, PA-C   Encounter Date: 11/19/2020   PT End of Session - 11/19/20 1416    Visit Number 16    Number of Visits 25    Date for PT Re-Evaluation 12/29/20    Authorization Type [employer] Medicare    Authorization Time Period 08/25/20-11/17/20    PT Start Time 5277    PT Stop Time 1429    PT Time Calculation (min) 41 min    Equipment Utilized During Treatment Gait belt    Activity Tolerance Patient tolerated treatment well;No increased pain;Patient limited by fatigue    Behavior During Therapy Francisco [name] for tasks assessed/performed           Past Medical History:  Diagnosis Date  . Arthritis    lower left hip  . Atypical angina ([place])   . Bilateral hand numbness    from back surgery  . Bronchitis, chronic ([place])   . Cancer [facility])    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; Merkel cell cancer, and Basal cell cancer (twice; 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  . Carotid stenosis   . CKD (chronic kidney disease) stage 3, GFR 30-59 ml/min (HCC)   . 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 DDD (degenerative disc disease), cervical   . Hypercholesterolemia   . Hypertension   . Hypothyroidism    pt takes Levothyroxine daily  . Lumbosacral spinal stenosis   . Myasthenia gravis, adult form ([place])   . PAD (peripheral artery disease) ([place])   . Shortness of breath    Lung MD- Dr Darlin Coco  . Sleep apnea    do not use CPAP every night    Past Surgical History:  Procedure Laterality Date  . ANTERIOR CERVICAL DECOMP/DISCECTOMY FUSION  07/18/2011   Procedure: ANTERIOR CERVICAL DECOMPRESSION/DISCECTOMY FUSION 2 LEVELS;  Surgeon: Cooper Render Pool;  Location: [place] [facility] ORS;  Service: Neurosurgery;   Laterality: N/A;  cervical five-six, cervical six-seven anterior cervical discectomy and fusion  . BACK SURGERY     in [name]     01/2020 Right, 04/2020 Left  . CARDIAC CATHETERIZATION     2005 at [facility], no stents  . CAROTID PTA/STENT INTERVENTION N/A 09/17/2020   Procedure: CAROTID PTA/STENT INTERVENTION;  Surgeon: Algernon Huxley, MD;  Location: [place] CV LAB;  Service: Cardiovascular;  Laterality: N/A;  . CATARACT EXTRACTION W/PHACO Left 01/06/2020   Procedure: CATARACT EXTRACTION PHACO AND INTRAOCULAR LENS PLACEMENT (IOC) ISTENT INJ LEFT 3.81  00:33.3;  Surgeon: Eulogio Bear, MD;  Location: [place];  Service: Ophthalmology;  Laterality: Left;  . CATARACT EXTRACTION W/PHACO Right 02/03/2020   Procedure: CATARACT EXTRACTION PHACO AND INTRAOCULAR LENS PLACEMENT ([place]) RIGHT ISTENT INJ;  Surgeon: Eulogio Bear, MD;  Location: [place];  Service: Ophthalmology;  Laterality: Right;  4.29 0:35.6  . COLONOSCOPY    . HERNIA REPAIR Left    inguinal hernia repair in 1985  . LUMBAR LAMINECTOMY/DECOMPRESSION MICRODISCECTOMY Left 02/24/2014   Procedure: LUMBAR LAMINECTOMY/DECOMPRESSION MICRODISCECTOMY LUMBAR THREE-FOUR, FOUR-FIVE, LEFT FIVE-SACRAL ONE ;  Surgeon: Charlie Pitter, MD;  Location: [place] [facility];  Service: Neurosurgery;  Laterality: Left;  LUMBAR LAMINECTOMY/DECOMPRESSION MICRODISCECTOMY LUMBAR THREE-FOUR, FOUR-FIVE, LEFT FIVE-SACRAL ONE   .  POSTERIOR CERVICAL FUSION/FORAMINOTOMY N/A 08/07/2020   Procedure: C3-6 POSTERIOR FUSION WITH DECOMPRESSION;  Surgeon: Meade Maw, MD;  Location: [facility] ORS;  Service: Neurosurgery;  Laterality: N/A;  . PROSTATECTOMY  8/14   [facility] Dr Mare Ferrari     There were no vitals filed for this visit.   Subjective Assessment - 11/19/20 1416    Subjective Patient reports compliance with HEP. No falls or LOB since last session.    Pertinent History Francisco [name] is a 2yoM  referred to OPPT neuro s/p myelopathy s/p cervical decompression and fusion. Pt sustained several months of general decline in mobility prior to surgical decompression. Pt went to inpatient rehab after hospitalization. [facility].  Pt underwent C3-6 decompression, fusion on 11/26. PMH: myasthenia gravis, 2012 ACDF 5/6, 6/7; OSA, hypothyroidism, HTN, COPD, multiple back surgeries, 2021 carpal tunnel release, referred to OPOT, but only seen for evaluation. 8/10 OPPT evaluation for LSS c radiating pain into the left hip, only completed evaluation session. September - November progressive decline in leg strength and tolerance to upright activity. Also noted a decline in tool manipulation for ADL (pen and meal utensil). Seen by OT and PT as an outpatient for other potential etiology (Carpal tunnel syndrome, and lumbar spinal stenosis), but eventually identified to be related to cervical myelopathy. Several months prior has no limitations in mobility or function. Pt scheduled for Jan 6th: Carotid endarterectomy c Dr. Leotis Pain. Pt has a long history of lumbar DJD, s/p L5/S1 decompression discectomy x2, L5 nerve root injections. Most recent Lumbar MRI (11/10/18) revealin gof advanced formaminal stenosis of L3/4, L4/5, L5/S1. Pt is has had LLE neurological weakness and pain since 2015 and has been followed by neurosurgery.    Limitations Walking;Lifting;Standing;House hold activities    How long can you sit comfortably? Not limited    How long can you stand comfortably? 3-5 minutes    How long can you walk comfortably? 10 minutes    Patient Stated Goals return to functional baseline status of Summer 2021.    Currently in Pain? No/denies              vitals at start of session: 142/53    Standing next to support surface.  airex pad: static stand 2x 30 seconds airex pad: horizontal head turns 2x 30 seconds  airex pad: sit to stand 10x with BUE support.    orange hurdle: forward step  over and back, challenging LLE 10x each LE cues for breathing and stepping technique Orange hurdle; lateral step over and back 15x each side.  Weight shift onto LLE with RLE on dynadisc 60 seconds x 2 trials  Hedgehog taps, SUE support required, 8x each LE three hedgehogs   Standing GTB row cues for sequencing 10x; very challenging for patient   2lb ankle weight:  March 8x each LE; BUE support   Seated: 2lb ankle weight:  -LAQ 12x each LE Adduction ball squeeze 10x 3 second holds  Green ball between knees, alteranting ER/IR 10x each LE      Vitals monitored throughout session     Pt educated throughout session about proper posture and technique with exercises. Improved exercise technique, movement at target joints, use of target muscles after min to mod verbal, visual, tactile cues   Patient is highly motivated throughout physical therapy session. He continues to be challenged with LLE strength and spatial awareness but improves each session. LLE fatigues quicker than RLE . The patient presents with increased weight shift to  right with balance activities with cueing needed to equalize weight.  Patient will benefit from skilled physical therapy to reduce fall risk, improve strength and mobility, and return to PLOF                          PT Education - 11/19/20 1416    Education provided Yes    Education Details exercise techinque, body mechanics    Person(s) Educated Patient    Methods Explanation;Demonstration;Tactile cues;Verbal cues    Comprehension Verbalized understanding;Returned demonstration;Verbal cues required;Tactile cues required            PT Short Term Goals - 10/21/20 1534      PT SHORT TERM GOAL #1   Title After 6 weeks patient will be independent in HEP to progress strength, AMB, and balance.    Baseline 2/9: HEP compliant    Time 6    Period Weeks    Status Partially Met    Target Date 11/17/20      PT SHORT TERM GOAL #2   Title  After 6 weeks pt will demonstrate improved 5xSTS hands free chair + airex foam in <20sec    Baseline 01/25: 31s hands free without airex foam 2/9: unable to do on airex    Time 6    Period Weeks    Status On-going    Target Date 11/17/20             PT Long Term Goals - 10/21/20 1532      PT LONG TERM GOAL #1   Title Patient will reduce timed up and go to <11 seconds to reduce fall risk and demonstrate improved transfer/gait ability.    Baseline 01/25: 30.26s 2/9: 19.55 seconds with RW    Time 12    Period Weeks    Status Partially Met    Target Date 12/29/20      PT LONG TERM GOAL #2   Title Patient will demonstrate 6MWT (LRAD) >778f to demonstrate improved capacity to access the community.    Baseline 12/16: 120 ft, 01/25: 165 ft with 1 seated rest break for 2 mins 2/9: 395 ft with RW and one seated rest break    Time 12    Period Weeks    Status Partially Met    Target Date 12/29/20      PT LONG TERM GOAL #3   Title Patient will increase 10 meter walk test to >1.037m as to improve gait speed for better community ambulation and to reduce fall risk.    Baseline 01/25: 0.3861m2/9: 0.81 m/s with RW    Time 12    Period Weeks    Status Partially Met    Target Date 12/29/20      PT LONG TERM GOAL #4   Title After 12 weeks pt to demonstrate improved FOTO score >70    Baseline 01/25: 51 2/9: 55.97 %    Time 12    Period Weeks    Status Partially Met    Target Date 12/29/20      PT LONG TERM GOAL #5   Title Patient (> 60 60ars old) will complete five times sit to stand test in < 15 seconds indicating an increased LE strength and improved balance.    Baseline 01/25: 31s 2/9: hands on knees 13.09    Time 12    Period Weeks    Status Achieved  Plan - 11/19/20 1630    Clinical Impression Statement Patient is highly motivated throughout physical therapy session. He continues to be challenged with LLE strength and spatial awareness but improves each  session. LLE fatigues quicker than RLE . The patient presents with increased weight shift to right with balance activities with cueing needed to equalize weight.  Patient will benefit from skilled physical therapy to reduce fall risk, improve strength and mobility, and return to PLOF    Personal Factors and Comorbidities Age;Comorbidity 2;Past/Current Experience;Time since onset of injury/illness/exacerbation    Comorbidities COPD, History of Cancer, myasthenia gravis, chronic lumbar surgical history.    Examination-Activity Limitations Lift;Squat;Bend;Stairs;Stand;Transfers;Insurance claims handler;Bathing;Hygiene/Grooming;Dressing;Toileting    Examination-Participation Restrictions Yard Work;Church;Cleaning;Driving    Stability/Clinical Decision Making Evolving/Moderate complexity    Rehab Potential Good    Clinical Impairments Affecting Rehab Potential PMH    PT Frequency 2x / week    PT Duration 12 weeks    PT Treatment/Interventions ADLs/Self Care Home Management;Electrical Stimulation;Moist Heat;Traction;Ultrasound;Gait training;Stair training;Functional mobility training;Therapeutic activities;Therapeutic exercise;Balance training;Neuromuscular re-education;Patient/family education;Manual techniques;Passive range of motion;Dry needling;Joint Manipulations;Cryotherapy    PT Next Visit Plan balance, strength; progress standing balance    PT Home Exercise Plan HEP: seated marches, seated heel raises, seated LAQ    Consulted and Agree with Plan of Care Patient           Patient will benefit from skilled therapeutic intervention in order to improve the following deficits and impairments:  Abnormal gait,Decreased balance,Decreased mobility,Decreased endurance,Difficulty walking,Hypomobility,Impaired sensation,Decreased range of motion,Impaired perceived functional ability,Decreased activity tolerance,Decreased coordination,Decreased strength,Impaired flexibility,Postural  dysfunction,Pain  Visit Diagnosis: Muscle weakness (generalized)  Unsteadiness on feet  Other lack of coordination     Problem List Patient Active Problem List   Diagnosis Date Noted  . Carotid stenosis, right 09/17/2020  . S/P cervical spinal fusion   . Leukocytosis   . Essential hypertension   . Anemia of chronic disease   . Postoperative pain   . Neuropathic pain   . Cervical myelopathy ([place]) 08/07/2020  . Preop cardiovascular exam 07/17/2020  . SOB (shortness of breath) on exertion 07/17/2020  . Leg weakness, bilateral 07/13/2020  . Aortic atherosclerosis ([place]) 07/09/2020  . Myalgia 10/30/2019  . PAD (peripheral artery disease) ([place]) 06/06/2019  . CKD (chronic kidney disease) stage 3, GFR 30-59 ml/min (HCC) 04/29/2019  . B12 deficiency 01/23/2019  . Left arm numbness 02/28/2018  . Neck pain 02/28/2018  . Anemia 02/02/2017  . DDD (degenerative disc disease), cervical 02/02/2017  . Hypothyroid 02/02/2017  . MRSA (methicillin resistant staph aureus) culture positive 02/02/2017  . Nocturnal hypoxia 02/02/2017  . Senile purpura ([place]) 10/03/2016  . Essential hypertension, benign 09/16/2016  . Bilateral carotid artery disease ([place]) 09/16/2016  . 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/18/2016  . 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 History of prostate cancer 12/21/2015  . Left carpal tunnel syndrome 11/11/2015  . Kidney stone on left side 07/05/2015  . Myasthenia gravis ([place]) 05/26/2015  . Long-term use of high-risk medication 10/15/2014  . Persistent cough 09/10/2014  . Pure hypercholesterolemia 07/25/2014  . Spinal stenosis, lumbar region, with neurogenic claudication 02/24/2014  . Lumbosacral stenosis with neurogenic claudication ([place]) 02/24/2014  . COPD (chronic obstructive pulmonary disease) ([place]) 01/22/2014   Janna Arch, PT, DPT   11/19/2020, 4:30 PM  [place] MAIN [facility] SERVICES [address] [place],  [place], [address] Phone: [phone]   Fax:  [phone]  Name: Francisco [name] MRN: [id number] Date of Birth: June 15, 1946

## 2020-11-24 ENCOUNTER — Ambulatory Visit: Payer: Medicare Other | Admitting: Occupational Therapy

## 2020-11-24 ENCOUNTER — Ambulatory Visit: Payer: Medicare Other

## 2020-11-26 ENCOUNTER — Ambulatory Visit: Payer: Medicare Other

## 2020-11-26 ENCOUNTER — Other Ambulatory Visit: Payer: Self-pay

## 2020-11-26 ENCOUNTER — Encounter: Payer: Self-pay | Admitting: Occupational Therapy

## 2020-11-26 ENCOUNTER — Ambulatory Visit: Payer: Medicare Other | Admitting: Occupational Therapy

## 2020-11-26 DIAGNOSIS — G959 Disease of spinal cord, unspecified: Secondary | ICD-10-CM

## 2020-11-26 DIAGNOSIS — G8929 Other chronic pain: Secondary | ICD-10-CM

## 2020-11-26 DIAGNOSIS — M6281 Muscle weakness (generalized): Secondary | ICD-10-CM

## 2020-11-26 DIAGNOSIS — R278 Other lack of coordination: Secondary | ICD-10-CM

## 2020-11-26 DIAGNOSIS — R2681 Unsteadiness on feet: Secondary | ICD-10-CM

## 2020-11-26 DIAGNOSIS — M792 Neuralgia and neuritis, unspecified: Secondary | ICD-10-CM

## 2020-11-26 NOTE — Therapy (Signed)
[place] MAIN [facility] SERVICES [address] [place], [place], [address] Phone: [phone]   Fax:  [phone]  Occupational Therapy Treatment  Patient Details  Name: Francisco [name] MRN: [id number] Date of Birth: Dec 17, 1945 Referring Provider (OT): Dr Annette Stable   Encounter Date: 11/26/2020   OT End of Session - 11/26/20 1317    Visit Number 8    Number of Visits 12    Date for OT Re-Evaluation 12/29/20    OT Start Time 1300    OT Stop Time 1345    OT Time Calculation (min) 45 min    Activity Tolerance Patient tolerated treatment well    Behavior During Therapy [facility] for tasks assessed/performed           Past Medical History:  Diagnosis Date  . Arthritis    lower left hip  . Atypical angina ([place])   . Bilateral hand numbness    from back surgery  . Bronchitis, chronic ([place])   . Cancer [facility])    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; Merkel cell cancer, and Basal cell cancer (twice; 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  . Carotid stenosis   . CKD (chronic kidney disease) stage 3, GFR 30-59 ml/min (HCC)   . 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 DDD (degenerative disc disease), cervical   . Hypercholesterolemia   . Hypertension   . Hypothyroidism    pt takes Levothyroxine daily  . Lumbosacral spinal stenosis   . Myasthenia gravis, adult form ([place])   . PAD (peripheral artery disease) ([place])   . Shortness of breath    Lung MD- Dr Darlin Coco  . Sleep apnea    do not use CPAP every night    Past Surgical History:  Procedure Laterality Date  . ANTERIOR CERVICAL DECOMP/DISCECTOMY FUSION  07/18/2011   Procedure: ANTERIOR CERVICAL DECOMPRESSION/DISCECTOMY FUSION 2 LEVELS;  Surgeon: Cooper Render Pool;  Location: [place] [facility];  Service: Neurosurgery;  Laterality: N/A;  cervical five-six, cervical six-seven anterior cervical discectomy and fusion  . BACK SURGERY     in [place]     01/2020 Right,  04/2020 Left  . CARDIAC CATHETERIZATION     2005 at [facility], no stents  . CAROTID PTA/STENT INTERVENTION N/A 09/17/2020   Procedure: CAROTID PTA/STENT INTERVENTION;  Surgeon: Algernon Huxley, MD;  Location: [place] CV LAB;  Service: Cardiovascular;  Laterality: N/A;  . CATARACT EXTRACTION W/PHACO Left 01/06/2020   Procedure: CATARACT EXTRACTION PHACO AND INTRAOCULAR LENS PLACEMENT (IOC) ISTENT INJ LEFT 3.81  00:33.3;  Surgeon: Eulogio Bear, MD;  Location: [place];  Service: Ophthalmology;  Laterality: Left;  . CATARACT EXTRACTION W/PHACO Right 02/03/2020   Procedure: CATARACT EXTRACTION PHACO AND INTRAOCULAR LENS PLACEMENT ([place]) RIGHT ISTENT INJ;  Surgeon: Eulogio Bear, MD;  Location: [place];  Service: Ophthalmology;  Laterality: Right;  4.29 0:35.6  . COLONOSCOPY    . HERNIA REPAIR Left    inguinal hernia repair in 1985  . LUMBAR LAMINECTOMY/DECOMPRESSION MICRODISCECTOMY Left 02/24/2014   Procedure: LUMBAR LAMINECTOMY/DECOMPRESSION MICRODISCECTOMY LUMBAR THREE-FOUR, FOUR-FIVE, LEFT FIVE-SACRAL ONE ;  Surgeon: Charlie Pitter, MD;  Location: [place] [facility] ORS;  Service: Neurosurgery;  Laterality: Left;  LUMBAR LAMINECTOMY/DECOMPRESSION MICRODISCECTOMY LUMBAR THREE-FOUR, FOUR-FIVE, LEFT FIVE-SACRAL ONE   . POSTERIOR CERVICAL FUSION/FORAMINOTOMY N/A 08/07/2020   Procedure: C3-6 POSTERIOR FUSION WITH DECOMPRESSION;  Surgeon: Meade Maw, MD;  Location: [facility] ORS;  Service: Neurosurgery;  Laterality: N/A;  .  PROSTATECTOMY  8/14   [facility] Dr Mare Ferrari     There were no vitals filed for this visit.   Subjective Assessment - 11/26/20 1316    Subjective  Pt reports he is doing well, forgot about his appt the other day.  Reports he was able to go to [facility] yesterday and walk around 1/2 of the store with a cart to hold onto.    Pertinent History Pt is a 75 y/o M with hx of cervical myelopathy & admitted on 08/07/20 for scheduled C3-6 Posterior fusion with decompression  by Dr. Izora Ribas. PMH: atypical angina, PAD, carotid stenosis, HTN, HLD, COPD, Myasthenia gravis, hypothyroidism, CKD-III, OSA requiring nocturnal PAP therapy, OA, cervical DDD, lumbosacral spinal stenosis, chronic DOE 2/2 COPD.  Pt went to IP rehab and was discharged on 08/20/2020 with orders for OP OT and PT.  History of carpal tunnel bilaterally.    Patient Stated Goals Pt would like to be able to walk again, and be independent as he was before.    Currently in Pain? No/denies    Pain Score 0-No pain          Therapeutic Exercises:   Pt seen for UE strengthening with use of UBE in standing, forwards/backwards, alternating levels of resistance from 3.0 to 3.2 for 8 mins total, rest break as needed.  Min guard for transfer from transport chair and during task.  Therapist in constant attendance to ensure grip, adjust settings and provide min guard for safety with balance. 3.5# dowel for shoulder flexion, ABD, ADD, chest press, forwards and backwards circles, elbow flexion/extension, wrist flex/ext for 15 reps for 2 sets each with therapist demo and cues for proper form and technique.     Response to tx: Patient reports he feels like he is doing well and needs the most attention to lower body strength and mobility however when engaging in UB exercises he realized he also needs to continue to work on strength to assist with mobility and daily tasks.  Able to tolerate UBE in standing today versus sitting.  Increased resistance with dowel exercises and increased reps.  Continue to work towards goals in plan of care to maximize safety and independence with ADL and IADL tasks at home, work and in the community.                         OT Education - 11/26/20 1317    Education Details strengthening    Person(s) Educated Patient    Methods Explanation;Demonstration;Handout;Verbal cues;Tactile cues    Comprehension Verbalized understanding;Returned demonstration;Verbal cues required                OT Long Term Goals - 10/06/20 1113      OT LONG TERM GOAL #1   Title Patient to be independent with home exercise program for strengthening and coordination skills.    Baseline decreased strength and coordination bilaterally after cervical surgery    Time 6    Period Weeks    Status On-going    Target Date 12/29/20      OT LONG TERM GOAL #2   Title Patient will improve grip strength bilaterally by 8# to hold objects securely without dropping.    Baseline Improving grip strength. Refer to [employer]    Time 12    Period Weeks    Status On-going    Target Date 12/29/20      OT LONG TERM GOAL #3   Title Patient  will perform self care tasks with modified independence.    Baseline Independent with morning care    Status Achieved      OT LONG TERM GOAL #4   Title Pt will complete light homemaking tasks with modified independence.    Baseline wife conitnues to assist    Time 12    Period Weeks    Status On-going    Target Date 12/29/20      OT LONG TERM GOAL #5   Title Patient will improve bilateral shoulder ROM and strength to place items on shelves at shoulder height with modified independence.    Baseline Pt. is able to reach items on shelves.    Status Achieved      OT LONG TERM GOAL #6   Title Pt will improve left UE coordination by decreasing time on 9 hole peg test by 10 secs to be able to manipulate and complete buttons with modified independence.    Baseline Pt. continues to have difficulty.refer to flow sheet for measurements    Time 12    Status On-going    Target Date 12/29/20                 Plan - 11/26/20 1319    Clinical Impression Statement Patient reports he feels like he is doing well and needs the most attention to lower body strength and mobility however when engaging in UB exercises he realized he also needs to continue to work on strength to assist with mobility and daily tasks.  Able to tolerate UBE in standing today  versus sitting.  Increased resistance with dowel exercises and increased reps.  Continue to work towards goals in plan of care to maximize safety and independence with ADL and IADL tasks at home, work and in the community.    OT Occupational Profile and History Detailed Assessment- Review of Records and additional review of physical, cognitive, psychosocial history related to current functional performance    Occupational performance deficits (Please refer to evaluation for details): ADL's;IADL's;Leisure;Work    Marketing executive / Function / Physical Skills ADL;Flexibility;ROM;UE functional use;FMC;Dexterity;Sensation;Strength;IADL;Coordination;Mobility;Decreased knowledge of use of DME;Balance;Endurance    Psychosocial Skills Environmental  Adaptations;Habits;Routines and Behaviors    Rehab Potential Good    Clinical Decision Making Several treatment options, min-mod task modification necessary    Comorbidities Affecting Occupational Performance: Presence of comorbidities impacting occupational performance    Comorbidities impacting occupational performance description: age, carpal tunnel history, COPD, chronic lumbar surgical history, history of myasthenia gravis, has a full time job and working currently, unable to drive    Modification or Assistance to Complete Evaluation  No modification of tasks or assist necessary to complete eval    OT Frequency 2x / week    OT Duration 6 weeks    OT Treatment/Interventions Self-care/ADL training;Therapeutic exercise;Neuromuscular education;Patient/family education;Therapeutic activities;Cryotherapy;Functional Mobility Training;DME and/or AE instruction;Balance training;Moist Heat    Consulted and Agree with Plan of Care Patient           Patient will benefit from skilled therapeutic intervention in order to improve the following deficits and impairments:   Body Structure / Function / Physical Skills: ADL,Flexibility,ROM,UE functional  use,FMC,Dexterity,Sensation,Strength,IADL,Coordination,Mobility,Decreased knowledge of use of DME,Balance,Endurance   Psychosocial Skills: Environmental  Adaptations,Habits,Routines and Behaviors   Visit Diagnosis: Muscle weakness (generalized)  Other lack of coordination  Unsteadiness on feet    Problem List Patient Active Problem List   Diagnosis Date Noted  . Carotid stenosis, right 09/17/2020  . S/P cervical spinal fusion   .  Leukocytosis   . Essential hypertension   . Anemia of chronic disease   . Postoperative pain   . Neuropathic pain   . Cervical myelopathy ([place]) 08/07/2020  . Preop cardiovascular exam 07/17/2020  . SOB (shortness of breath) on exertion 07/17/2020  . Leg weakness, bilateral 07/13/2020  . Aortic atherosclerosis ([place]) 07/09/2020  . Myalgia 10/30/2019  . PAD (peripheral artery disease) ([place]) 06/06/2019  . CKD (chronic kidney disease) stage 3, GFR 30-59 ml/min (HCC) 04/29/2019  . B12 deficiency 01/23/2019  . Left arm numbness 02/28/2018  . Neck pain 02/28/2018  . Anemia 02/02/2017  . DDD (degenerative disc disease), cervical 02/02/2017  . Hypothyroid 02/02/2017  . MRSA (methicillin resistant staph aureus) culture positive 02/02/2017  . Nocturnal hypoxia 02/02/2017  . Senile purpura ([place]) 10/03/2016  . Essential hypertension, benign 09/16/2016  . Bilateral carotid artery disease ([place]) 09/16/2016  . 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/18/2016  . 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 History of prostate cancer 12/21/2015  . Left carpal tunnel syndrome 11/11/2015  . Kidney stone on left side 07/05/2015  . Myasthenia gravis ([place]) 05/26/2015  . Long-term use of high-risk medication 10/15/2014  . Persistent cough 09/10/2014  . Pure hypercholesterolemia 07/25/2014  . Spinal stenosis, lumbar region, with neurogenic claudication 02/24/2014  . Lumbosacral stenosis with neurogenic claudication ([place]) 02/24/2014  . COPD (chronic obstructive pulmonary  disease) ([place]) 01/22/2014   Alyssa Mancera T Monay Houlton, OTR/L, CLT  Desmond Tufano 11/27/2020, 9:25 AM  [place] MAIN [facility] SERVICES [address] [place], [place], [address] Phone: [phone]   Fax:  [phone]  Name: Francisco [name] MRN: [id number] Date of Birth: 1945-10-02

## 2020-11-26 NOTE — Therapy (Signed)
[place] MAIN [facility] SERVICES [address] [place], [place], [address] Phone: [phone]   Fax:  [phone]  Physical Therapy Treatment  Patient Details  Name: Francisco [name] MRN: [id number] Date of Birth: 1946-08-01 Referring Provider (PT): Lauraine Rinne, PA-C   Encounter Date: 11/26/2020   PT End of Session - 11/26/20 1356    Visit Number 17    Number of Visits 25    Date for PT Re-Evaluation 12/29/20    Authorization Type [employer] Medicare    Authorization Time Period 08/25/20-11/17/20; 10/06/20-12/29/20    PT Start Time 9702    PT Stop Time 1429    PT Time Calculation (min) 40 min    Equipment Utilized During Treatment Gait belt    Activity Tolerance Patient tolerated treatment well;No increased pain;Patient limited by fatigue    Behavior During Therapy [facility] for tasks assessed/performed           Past Medical History:  Diagnosis Date   Arthritis    lower left hip   Atypical angina (HCC)    Bilateral hand numbness    from back surgery   Bronchitis, chronic (HCC)    Cancer ([place])    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; Merkel cell cancer, and Basal cell cancer (twice; 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   Carotid stenosis    CKD (chronic kidney disease) stage 3, GFR 30-59 ml/min (HCC)    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   DDD (degenerative disc disease), cervical    Hypercholesterolemia    Hypertension    Hypothyroidism    pt takes Levothyroxine daily   Lumbosacral spinal stenosis    Myasthenia gravis, adult form ([place])    PAD (peripheral artery disease) (HCC)    Shortness of breath    Lung MD- Dr Darlin Coco   Sleep apnea    do not use CPAP every night    Past Surgical History:  Procedure Laterality Date   ANTERIOR CERVICAL DECOMP/DISCECTOMY FUSION  07/18/2011   Procedure: ANTERIOR CERVICAL DECOMPRESSION/DISCECTOMY FUSION 2 LEVELS;  Surgeon: Cooper Render Pool;  Location: [facility];  Service:  Neurosurgery;  Laterality: N/A;  cervical five-six, cervical six-seven anterior cervical discectomy and fusion   BACK SURGERY     in [place]     01/2020 Right, 04/2020 Left   CARDIAC CATHETERIZATION     2005 at [facility], no stents   CAROTID PTA/STENT INTERVENTION N/A 09/17/2020   Procedure: CAROTID PTA/STENT INTERVENTION;  Surgeon: Algernon Huxley, MD;  Location: [facility];  Service: Cardiovascular;  Laterality: N/A;   CATARACT EXTRACTION W/PHACO Left 01/06/2020   Procedure: CATARACT EXTRACTION PHACO AND INTRAOCULAR LENS PLACEMENT (IOC) ISTENT INJ LEFT 3.81  00:33.3;  Surgeon: Eulogio Bear, MD;  Location: [place];  Service: Ophthalmology;  Laterality: Left;   CATARACT EXTRACTION W/PHACO Right 02/03/2020   Procedure: CATARACT EXTRACTION PHACO AND INTRAOCULAR LENS PLACEMENT ([place]) RIGHT ISTENT INJ;  Surgeon: Eulogio Bear, MD;  Location: [place];  Service: Ophthalmology;  Laterality: Right;  4.29 0:35.6   COLONOSCOPY     HERNIA REPAIR Left    inguinal hernia repair in 1985   LUMBAR LAMINECTOMY/DECOMPRESSION MICRODISCECTOMY Left 02/24/2014   Procedure: LUMBAR LAMINECTOMY/DECOMPRESSION MICRODISCECTOMY LUMBAR THREE-FOUR, FOUR-FIVE, LEFT FIVE-SACRAL ONE ;  Surgeon: Charlie Pitter, MD;  Location: [facility];  Service: Neurosurgery;  Laterality: Left;  LUMBAR LAMINECTOMY/DECOMPRESSION MICRODISCECTOMY LUMBAR THREE-FOUR, FOUR-FIVE, LEFT FIVE-SACRAL ONE  POSTERIOR CERVICAL FUSION/FORAMINOTOMY N/A 08/07/2020   Procedure: C3-6 POSTERIOR FUSION WITH DECOMPRESSION;  Surgeon: Meade Maw, MD;  Location: [facility] ORS;  Service: Neurosurgery;  Laterality: N/A;   PROSTATECTOMY  8/14   [facility] Dr Mare Ferrari     There were no vitals filed for this visit.   Subjective Assessment - 11/26/20 1353    Subjective Pt doing well today, remains optimistic with his progress, wants to be free of RW by end of April. Reports to have  missed his last session due to being engaged in a project at home, losing track of time.    Pertinent History Francisco [name] is a 40yoM referred to OPPT neuro s/p myelopathy s/p cervical decompression and fusion. Pt sustained several months of general decline in mobility prior to surgical decompression. Pt went to inpatient rehab after hospitalization. [facility].  Pt underwent C3-6 decompression, fusion on 11/26. PMH: myasthenia gravis, 2012 ACDF 5/6, 6/7; OSA, hypothyroidism, HTN, COPD, multiple back surgeries, 2021 carpal tunnel release, referred to OPOT, but only seen for evaluation. 8/10 OPPT evaluation for LSS c radiating pain into the left hip, only completed evaluation session. September - November progressive decline in leg strength and tolerance to upright activity. Also noted a decline in tool manipulation for ADL (pen and meal utensil). Seen by OT and PT as an outpatient for other potential etiology (Carpal tunnel syndrome, and lumbar spinal stenosis), but eventually identified to be related to cervical myelopathy. Several months prior has no limitations in mobility or function. Pt scheduled for Jan 6th: Carotid endarterectomy c Dr. Leotis Pain. Pt has a long history of lumbar DJD, s/p L5/S1 decompression discectomy x2, L5 nerve root injections. Most recent Lumbar MRI (11/10/18) revealin gof advanced formaminal stenosis of L3/4, L4/5, L5/S1. Pt is has had LLE neurological weakness and pain since 2015 and has been followed by neurosurgery.    Limitations Walking;Lifting;Standing;House hold activities    Currently in Pain? No/denies           INTERVENTION THIS DATE:  -minA HHA for step on/off airex pad  -airex pad, self selected stance, no UE support x3 minutes Seated rest interval x 3 minutes  -minGuardA step on/off airex pad c SPC RUE -airex stance Chest press c bimanual grip on 4lb free-weight 1x15  -airex stance c green [place] ball self toss catch 1x15 (terminates  with significant loss of postural control)  Seated rest interval x 3 minutes  -minGuardA step on/off airex pad c SPC RUE -rotation c big blue physioball 8x bilat (16x total)  -four-square stepping c SPC RUE 3x c minGuard assist  Seated rest interval x 3 minutes  -Lateral step ups 6x bilat, 1" step, SPC RUE minGuard assist (used upside-down rocker board)  -safe mount/dismount of low rocker board at pt request, BUE support on stair rails    PT Short Term Goals - 10/21/20 1534      PT SHORT TERM GOAL #1   Title After 6 weeks patient will be independent in HEP to progress strength, AMB, and balance.    Baseline 2/9: HEP compliant    Time 6    Period Weeks    Status Partially Met    Target Date 11/17/20      PT SHORT TERM GOAL #2   Title After 6 weeks pt will demonstrate improved 5xSTS hands free chair + airex foam in <20sec    Baseline 01/25: 31s hands free without airex foam 2/9: unable to do on airex  Time 6    Period Weeks    Status On-going    Target Date 11/17/20             PT Long Term Goals - 10/21/20 1532      PT LONG TERM GOAL #1   Title Patient will reduce timed up and go to <11 seconds to reduce fall risk and demonstrate improved transfer/gait ability.    Baseline 01/25: 30.26s 2/9: 19.55 seconds with RW    Time 12    Period Weeks    Status Partially Met    Target Date 12/29/20      PT LONG TERM GOAL #2   Title Patient will demonstrate 6MWT (LRAD) >754f to demonstrate improved capacity to access the community.    Baseline 12/16: 120 ft, 01/25: 165 ft with 1 seated rest break for 2 mins 2/9: 395 ft with RW and one seated rest break    Time 12    Period Weeks    Status Partially Met    Target Date 12/29/20      PT LONG TERM GOAL #3   Title Patient will increase 10 meter walk test to >1.0107m as to improve gait speed for better community ambulation and to reduce fall risk.    Baseline 01/25: 0.38108m2/9: 0.81 m/s with RW    Time 12    Period Weeks     Status Partially Met    Target Date 12/29/20      PT LONG TERM GOAL #4   Title After 12 weeks pt to demonstrate improved FOTO score >70    Baseline 01/25: 51 2/9: 55.97 %    Time 12    Period Weeks    Status Partially Met    Target Date 12/29/20      PT LONG TERM GOAL #5   Title Patient (> 60 28ars old) will complete five times sit to stand test in < 15 seconds indicating an increased LE strength and improved balance.    Baseline 01/25: 31s 2/9: hands on knees 13.09    Time 12    Period Weeks    Status Achieved                 Plan - 11/26/20 1358    Clinical Impression Statement Continued with current plan of care as laid out in evaluation and recent prior sessions. Heavier focus on transition from static to dynamic balance this date, temporarily omitting some purely strength-oriented activities. Pt remains motivated to advance progress toward goals in order to maximize independence and safety at home. Pt requires high level assistance and cuing for completion of exercises in order to provide adequate level of stimulation and perturbation. Pt does well with [facility], however only educated on use for stepping strategies and balance in stnading. Author allows pt as much opportunity as possible to perform independent righting strategies, only stepping in when pt is unable to prevent falling to floor. Balance control is largely limited by fatigue limitations, with most activities maxing out at around 2-3 minutes.  Pt closely monitored throughout session for safe vitals response and to maximize patient safety during interventions. Pt continues to demonstrate progress toward goals AEB progression of some interventions this date either in volume or intensity.   Personal Factors and Comorbidities Age;Comorbidity 2;Past/Current Experience;Time since onset of injury/illness/exacerbation    Comorbidities COPD, History of Cancer, myasthenia gravis, chronic lumbar surgical history.     Examination-Activity Limitations Lift;Squat;Bend;Stairs;Stand;Transfers;LocInsurance claims handlerthing;Hygiene/Grooming;Dressing;Toileting    Examination-Participation  Restrictions Yard Work;Church;Cleaning;Driving    Stability/Clinical Decision Making Stable/Uncomplicated    Clinical Decision Making High    Rehab Potential Good    Clinical Impairments Affecting Rehab Potential PMH    PT Frequency 2x / week    PT Duration 12 weeks    PT Treatment/Interventions ADLs/Self Care Home Management;Electrical Stimulation;Moist Heat;Traction;Ultrasound;Gait training;Stair training;Functional mobility training;Therapeutic activities;Therapeutic exercise;Balance training;Neuromuscular re-education;Patient/family education;Manual techniques;Passive range of motion;Dry needling;Joint Manipulations;Cryotherapy    PT Next Visit Plan balance, strength; progress standing balance    PT Home Exercise Plan HEP: seated marches, seated heel raises, seated LAQ    Consulted and Agree with Plan of Care Patient           Patient will benefit from skilled therapeutic intervention in order to improve the following deficits and impairments:  Abnormal gait,Decreased balance,Decreased mobility,Decreased endurance,Difficulty walking,Hypomobility,Impaired sensation,Decreased range of motion,Impaired perceived functional ability,Decreased activity tolerance,Decreased coordination,Decreased strength,Impaired flexibility,Postural dysfunction,Pain  Visit Diagnosis: Muscle weakness (generalized)  Other lack of coordination  Unsteadiness on feet  Chronic left-sided low back pain with left-sided sciatica  Cervical myelopathy (HCC)  Neuropathic pain     Problem List Patient Active Problem List   Diagnosis Date Noted   Carotid stenosis, right 09/17/2020   S/P cervical spinal fusion    Leukocytosis    Essential hypertension    Anemia of chronic disease    Postoperative pain    Neuropathic  pain    Cervical myelopathy (HCC) 08/07/2020   Preop cardiovascular exam 07/17/2020   SOB (shortness of breath) on exertion 07/17/2020   Leg weakness, bilateral 07/13/2020   Aortic atherosclerosis ([place]) 07/09/2020   Myalgia 10/30/2019   PAD (peripheral artery disease) ([place]) 06/06/2019   CKD (chronic kidney disease) stage 3, GFR 30-59 ml/min ([place]) 04/29/2019   B12 deficiency 01/23/2019   Left arm numbness 02/28/2018   Neck pain 02/28/2018   Anemia 02/02/2017   DDD (degenerative disc disease), cervical 02/02/2017   Hypothyroid 02/02/2017   MRSA (methicillin resistant staph aureus) culture positive 02/02/2017   Nocturnal hypoxia 02/02/2017   Senile purpura ([place]) 10/03/2016   Essential hypertension, benign 09/16/2016   Bilateral carotid artery disease ([place]) 09/16/2016   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/18/2016   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   History of prostate cancer 12/21/2015   Left carpal tunnel syndrome 11/11/2015   Kidney stone on left side 07/05/2015   Myasthenia gravis ([place]) 05/26/2015   Long-term use of high-risk medication 10/15/2014   Persistent cough 09/10/2014   Pure hypercholesterolemia 07/25/2014   Spinal stenosis, lumbar region, with neurogenic claudication 02/24/2014   Lumbosacral stenosis with neurogenic claudication ([place]) 02/24/2014   COPD (chronic obstructive pulmonary disease) ([place]) 01/22/2014   2:47 PM, 11/26/20 Etta Grandchild, PT, DPT Physical Therapist - [facility]  Outpatient Physical Therapy- [place] [phone]     [place] C 11/26/2020, 2:05 PM  [place] MAIN [facility] SERVICES [address] [place], [place], [address] Phone: [phone]   Fax:  [phone]  Name: Francisco [name] MRN: [id number] Date of Birth: 12-30-1945

## 2020-12-01 ENCOUNTER — Ambulatory Visit: Payer: Medicare Other | Admitting: Occupational Therapy

## 2020-12-01 ENCOUNTER — Other Ambulatory Visit: Payer: Self-pay

## 2020-12-01 ENCOUNTER — Ambulatory Visit: Payer: Medicare Other

## 2020-12-01 DIAGNOSIS — R2681 Unsteadiness on feet: Secondary | ICD-10-CM

## 2020-12-01 DIAGNOSIS — R278 Other lack of coordination: Secondary | ICD-10-CM

## 2020-12-01 DIAGNOSIS — M6281 Muscle weakness (generalized): Secondary | ICD-10-CM

## 2020-12-01 DIAGNOSIS — G8929 Other chronic pain: Secondary | ICD-10-CM

## 2020-12-01 NOTE — Therapy (Signed)
[place] MAIN [facility] SERVICES [address] [place], [place], [address] Phone: [phone]   Fax:  [phone]  Patient Details  Name: Francisco [name] MRN: [id number] Date of Birth: 1946/02/21 Referring Provider:  Cathlyn Parsons, PA-C  Encounter Date: 12/01/2020   Pt. Arrived for PT today, however reports that he was unaware that he had an OT appointment, and is not feeling well enough to stay for his session this afternoon.   Harrel Carina, MS, OTR/L 12/01/2020, 5:59 PM  [place] MAIN [facility] SERVICES [address] [place], [place], [address] Phone: [phone]   Fax:  [phone]

## 2020-12-01 NOTE — Therapy (Signed)
[name] MAIN [facility] SERVICES [address] [place], [place], [address] Phone: [phone]   Fax:  [phone]  Physical Therapy Treatment  Patient Details  Name: Francisco [name] MRN: [id number] Date of Birth: February 12, 1946 Referring Provider (PT): Lauraine Rinne, PA-C   Encounter Date: 12/01/2020   PT End of Session - 12/01/20 1409    Visit Number 18    Number of Visits 25    Date for PT Re-Evaluation 12/29/20    Authorization Type [employer] Medicare    Authorization Time Period 08/25/20-11/17/20; 10/06/20-12/29/20    PT Start Time 1350    PT Stop Time 1430    PT Time Calculation (min) 40 min    Equipment Utilized During Treatment Gait belt    Activity Tolerance Patient tolerated treatment well;No increased pain;Patient limited by fatigue    Behavior During Therapy [facility] for tasks assessed/performed           Past Medical History:  Diagnosis Date  . Arthritis    lower left hip  . Atypical angina ([place])   . Bilateral hand numbness    from back surgery  . Bronchitis, chronic ([place])   . Cancer [facility])    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; Merkel cell cancer, and Basal cell cancer (twice; 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  . Carotid stenosis   . CKD (chronic kidney disease) stage 3, GFR 30-59 ml/min (HCC)   . 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 DDD (degenerative disc disease), cervical   . Hypercholesterolemia   . Hypertension   . Hypothyroidism    pt takes Levothyroxine daily  . Lumbosacral spinal stenosis   . Myasthenia gravis, adult form ([place])   . PAD (peripheral artery disease) ([place])   . Shortness of breath    Lung MD- Dr Darlin Coco  . Sleep apnea    do not use CPAP every night    Past Surgical History:  Procedure Laterality Date  . ANTERIOR CERVICAL DECOMP/DISCECTOMY FUSION  07/18/2011   Procedure: ANTERIOR CERVICAL DECOMPRESSION/DISCECTOMY FUSION 2 LEVELS;  Surgeon: Cooper Render Pool;  Location: [place] NEURO ORS;  Service:  Neurosurgery;  Laterality: N/A;  cervical five-six, cervical six-seven anterior cervical discectomy and fusion  . BACK SURGERY     in [place]     01/2020 Right, 04/2020 Left  . CARDIAC CATHETERIZATION     2005 at [facility], no stents  . CAROTID PTA/STENT INTERVENTION N/A 09/17/2020   Procedure: CAROTID PTA/STENT INTERVENTION;  Surgeon: Algernon Huxley, MD;  Location: [place] CV LAB;  Service: Cardiovascular;  Laterality: N/A;  . CATARACT EXTRACTION W/PHACO Left 01/06/2020   Procedure: CATARACT EXTRACTION PHACO AND INTRAOCULAR LENS PLACEMENT (IOC) ISTENT INJ LEFT 3.81  00:33.3;  Surgeon: Eulogio Bear, MD;  Location: [place];  Service: Ophthalmology;  Laterality: Left;  . CATARACT EXTRACTION W/PHACO Right 02/03/2020   Procedure: CATARACT EXTRACTION PHACO AND INTRAOCULAR LENS PLACEMENT ([place]) RIGHT ISTENT INJ;  Surgeon: Eulogio Bear, MD;  Location: [place];  Service: Ophthalmology;  Laterality: Right;  4.29 0:35.6  . COLONOSCOPY    . HERNIA REPAIR Left    inguinal hernia repair in 1985  . LUMBAR LAMINECTOMY/DECOMPRESSION MICRODISCECTOMY Left 02/24/2014   Procedure: LUMBAR LAMINECTOMY/DECOMPRESSION MICRODISCECTOMY LUMBAR THREE-FOUR, FOUR-FIVE, LEFT FIVE-SACRAL ONE ;  Surgeon: Charlie Pitter, MD;  Location: [place] NEURO ORS;  Service: Neurosurgery;  Laterality: Left;  LUMBAR LAMINECTOMY/DECOMPRESSION MICRODISCECTOMY LUMBAR THREE-FOUR, FOUR-FIVE, LEFT FIVE-SACRAL ONE   .  POSTERIOR CERVICAL FUSION/FORAMINOTOMY N/A 08/07/2020   Procedure: C3-6 POSTERIOR FUSION WITH DECOMPRESSION;  Surgeon: Venetia Night, MD;  Location: [facility] ORS;  Service: Neurosurgery;  Laterality: N/A;  . PROSTATECTOMY  8/14   [facility] Dr Joycelyn Das     There were no vitals filed for this visit.   Subjective Assessment - 12/01/20 1354    Subjective Pt doing well today in general. Reports his RUE and RLE began to quiver, tremor again today, first time since  starting gabapentin months back. Pressure at session start: 137/55mmHg.    Pertinent History Francisco [name] is a 74yoM referred to OPPT neuro s/p myelopathy s/p cervical decompression and fusion. Pt sustained several months of general decline in mobility prior to surgical decompression. Pt went to inpatient rehab after hospitalization. [facility].  Pt underwent C3-6 decompression, fusion on 11/26. PMH: myasthenia gravis, 2012 ACDF 5/6, 6/7; OSA, hypothyroidism, HTN, COPD, multiple back surgeries, 2021 carpal tunnel release, referred to OPOT, but only seen for evaluation. 8/10 OPPT evaluation for LSS c radiating pain into the left hip, only completed evaluation session. September - November progressive decline in leg strength and tolerance to upright activity. Also noted a decline in tool manipulation for ADL (pen and meal utensil). Seen by OT and PT as an outpatient for other potential etiology (Carpal tunnel syndrome, and lumbar spinal stenosis), but eventually identified to be related to cervical myelopathy. Several months prior has no limitations in mobility or function. Pt scheduled for Jan 6th: Carotid endarterectomy c Dr. Festus Barren. Pt has a long history of lumbar DJD, s/p L5/S1 decompression discectomy x2, L5 nerve root injections. Most recent Lumbar MRI (11/10/18) revealin gof advanced formaminal stenosis of L3/4, L4/5, L5/S1. Pt is has had LLE neurological weakness and pain since 2015 and has been followed by neurosurgery.    Currently in Pain? No/denies           INTERVENTION THIS DATE:   Overground gait training c RW, 2lb AW on Left ankle  -41ft 67 seconds -69ft 74 seconds  -31ft 79 seconds  -22ft 75 seconds  -31ft 64 seconds  -62ft 56 seconds  -27ft to BR for voiding,  -9ft return to // bars  *90sec seated rest interval between each c STS supervision level and RW   -5Xsts c hands (does not use after) 2nd rep 13.46sec -5xSTS hands free (~13 sec)   -lateral  side stepping hands free in // bars (focus on smaller faster steps) 2RT then rest -seated rest interval 2 minutes -lateral side stepping hands free in // bars (focus on smaller faster steps) 2RT then rest (legs getting weaker, questionable LSS contributing due to more uprght posture due to rapid onset leg fatigue -standing in // unsupported UE, 180 degree turns 5x bilat, then stops due to leg fatigue, again has quick onset loss of leg strength, fairly suspicious for lumbar neurogenic claudication    Heavy repeated DOE in session after 60-70 sec efforts,  -VSS each time (SpO2: 97%, HR in 80s)  Pt has multiple PMH factors that can cause DOE including: -Atherosclerosis s/p stent 09/18/19, LVEF: 55-60% (per cardiology Note Feb '22) WNL -Mild MR (per cardiology Note Feb '22) -PAF c historic episodic PVC, on anticoagulation (potential contributions to DOE)  -Anemia: 9.8 on 2/16 (>/= to prior 3 months Hb) Stable, chronic, likely well compensated, not likely contributing for such short efforts  -COPD (per cardiology note, is attributed to chronic DOE with patient)      PT Short Term  Goals - 10/21/20 1534      PT SHORT TERM GOAL #1   Title After 6 weeks patient will be independent in HEP to progress strength, AMB, and balance.    Baseline 2/9: HEP compliant    Time 6    Period Weeks    Status Partially Met    Target Date 11/17/20      PT SHORT TERM GOAL #2   Title After 6 weeks pt will demonstrate improved 5xSTS hands free chair + airex foam in <20sec    Baseline 01/25: 31s hands free without airex foam 2/9: unable to do on airex    Time 6    Period Weeks    Status On-going    Target Date 11/17/20             PT Long Term Goals - 10/21/20 1532      PT LONG TERM GOAL #1   Title Patient will reduce timed up and go to <11 seconds to reduce fall risk and demonstrate improved transfer/gait ability.    Baseline 01/25: 30.26s 2/9: 19.55 seconds with RW    Time 12    Period Weeks     Status Partially Met    Target Date 12/29/20      PT LONG TERM GOAL #2   Title Patient will demonstrate (LRAD) >757ft to demonstrate improved capacity to access the community.    Baseline 12/16: 120 ft, 01/25: 165 ft with 1 seated rest break for 2 mins 2/9: 395 ft with RW and one seated rest break    Time 12    Period Weeks    Status Partially Met    Target Date 12/29/20      PT LONG TERM GOAL #3   Title Patient will increase 10 meter walk test to >1.8m/s as to improve gait speed for better community ambulation and to reduce fall risk.    Baseline 01/25: 0.57m/s 2/9: 0.81 m/s with RW    Time 12    Period Weeks    Status Partially Met    Target Date 12/29/20      PT LONG TERM GOAL #4   Title After 12 weeks pt to demonstrate improved FOTO score >70    Baseline 01/25: 51 2/9: 55.97 %    Time 12    Period Weeks    Status Partially Met    Target Date 12/29/20      PT LONG TERM GOAL #5   Title Patient (> 40 years old) will complete five times sit to stand test in < 15 seconds indicating an increased LE strength and improved balance.    Baseline 01/25: 31s 2/9: hands on knees 13.09    Time 12    Period Weeks    Status Achieved                 Plan - 12/01/20 1409    Clinical Impression Statement Continued with current plan of care as laid out in evaluation and recent prior sessions. Pt remains motivated to advance progress toward goals. Rechecked 5xSTS today now able to perform hands free in 13sec- goal updated to better reflect potential for progress. Rest breaks provided as needed, pt quick to ask when needed. Pt does require varying levels of assistance and cuing for completion of exercises for correct form and sometimes due to pain/weakness. Pt closely monitored throughout session for safe vitals response and to maximize patient safety during interventions. Pt reports leg loosen up with each  AMB interval. Pt continues to demonstrate progress toward goals AEB progression  of some interventions this date either in volume or intensity.   Personal Factors and Comorbidities Age;Comorbidity 2;Past/Current Experience;Time since onset of injury/illness/exacerbation    Comorbidities COPD, History of Cancer, myasthenia gravis, chronic lumbar surgical history.    Examination-Activity Limitations Lift;Squat;Bend;Stairs;Stand;Transfers;Insurance claims handler;Bathing;Hygiene/Grooming;Dressing;Toileting    Examination-Participation Restrictions Yard Work;Church;Cleaning;Driving    Stability/Clinical Decision Making Stable/Uncomplicated    Clinical Decision Making High    Rehab Potential Good    Clinical Impairments Affecting Rehab Potential PMH    PT Frequency 2x / week    PT Duration 12 weeks    PT Treatment/Interventions ADLs/Self Care Home Management;Electrical Stimulation;Moist Heat;Traction;Ultrasound;Gait training;Stair training;Functional mobility training;Therapeutic activities;Therapeutic exercise;Balance training;Neuromuscular re-education;Patient/family education;Manual techniques;Passive range of motion;Dry needling;Joint Manipulations;Cryotherapy    PT Next Visit Plan balance, strength; progress standing balance    PT Home Exercise Plan HEP: seated marches, seated heel raises, seated LAQ    Consulted and Agree with Plan of Care Patient           Patient will benefit from skilled therapeutic intervention in order to improve the following deficits and impairments:  Abnormal gait,Decreased balance,Decreased mobility,Decreased endurance,Difficulty walking,Hypomobility,Impaired sensation,Decreased range of motion,Impaired perceived functional ability,Decreased activity tolerance,Decreased coordination,Decreased strength,Impaired flexibility,Postural dysfunction,Pain  Visit Diagnosis: Muscle weakness (generalized)  Other lack of coordination  Unsteadiness on feet  Chronic left-sided low back pain with left-sided sciatica     Problem  List Patient Active Problem List   Diagnosis Date Noted  . Carotid stenosis, right 09/17/2020  . S/P cervical spinal fusion   . Leukocytosis   . Essential hypertension   . Anemia of chronic disease   . Postoperative pain   . Neuropathic pain   . Cervical myelopathy ([place]) 08/07/2020  . Preop cardiovascular exam 07/17/2020  . SOB (shortness of breath) on exertion 07/17/2020  . Leg weakness, bilateral 07/13/2020  . Aortic atherosclerosis ([place]) 07/09/2020  . Myalgia 10/30/2019  . PAD (peripheral artery disease) ([place]) 06/06/2019  . CKD (chronic kidney disease) stage 3, GFR 30-59 ml/min (HCC) 04/29/2019  . B12 deficiency 01/23/2019  . Left arm numbness 02/28/2018  . Neck pain 02/28/2018  . Anemia 02/02/2017  . DDD (degenerative disc disease), cervical 02/02/2017  . Hypothyroid 02/02/2017  . MRSA (methicillin resistant staph aureus) culture positive 02/02/2017  . Nocturnal hypoxia 02/02/2017  . Senile purpura ([place]) 10/03/2016  . Essential hypertension, benign 09/16/2016  . Bilateral carotid artery disease ([place]) 09/16/2016  . 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/18/2016  . 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 History of prostate cancer 12/21/2015  . Left carpal tunnel syndrome 11/11/2015  . Kidney stone on left side 07/05/2015  . Myasthenia gravis ([place]) 05/26/2015  . Long-term use of high-risk medication 10/15/2014  . Persistent cough 09/10/2014  . Pure hypercholesterolemia 07/25/2014  . Spinal stenosis, lumbar region, with neurogenic claudication 02/24/2014  . Lumbosacral stenosis with neurogenic claudication ([place]) 02/24/2014  . COPD (chronic obstructive pulmonary disease) ([place]) 01/22/2014   2:19 PM, 12/01/20 Etta Grandchild, PT, DPT Physical Therapist - [place] [phone]     Etta Grandchild 12/01/2020, 2:13 PM  [place] MAIN [facility] SERVICES [address] [place], [place], [address] Phone: [phone]   Fax:  [phone]  Name: Francisco [name] MRN: [id number] Date of Birth: 11-21-1945

## 2020-12-03 ENCOUNTER — Ambulatory Visit: Payer: Medicare Other

## 2020-12-03 ENCOUNTER — Encounter: Payer: Self-pay | Admitting: Occupational Therapy

## 2020-12-03 ENCOUNTER — Ambulatory Visit: Payer: Medicare Other | Admitting: Occupational Therapy

## 2020-12-03 ENCOUNTER — Other Ambulatory Visit: Payer: Self-pay

## 2020-12-03 VITALS — BP 141/65 | HR 61

## 2020-12-03 DIAGNOSIS — M6281 Muscle weakness (generalized): Secondary | ICD-10-CM | POA: Diagnosis not present

## 2020-12-03 DIAGNOSIS — R2681 Unsteadiness on feet: Secondary | ICD-10-CM

## 2020-12-03 DIAGNOSIS — R278 Other lack of coordination: Secondary | ICD-10-CM

## 2020-12-03 NOTE — Therapy (Signed)
[place] MAIN [facility] SERVICES [address] [place], [place], [address] Phone: [phone]   Fax:  [phone]  Occupational Therapy Treatment  Patient Details  Name: Francisco [name] MRN: [id number] Date of Birth: 04-21-1946 Referring Provider (OT): Dr Annette Stable   Encounter Date: 12/03/2020   OT End of Session - 12/03/20 1331    Visit Number 9    Number of Visits 12    Date for OT Re-Evaluation 12/29/20    OT Start Time 6962    Activity Tolerance Patient tolerated treatment well    Behavior During Therapy [facility] for tasks assessed/performed           Past Medical History:  Diagnosis Date  . Arthritis    lower left hip  . Atypical angina ([place])   . Bilateral hand numbness    from back surgery  . Bronchitis, chronic ([place])   . Cancer [facility])    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; Merkel cell cancer, and Basal cell cancer (twice; 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  . Carotid stenosis   . CKD (chronic kidney disease) stage 3, GFR 30-59 ml/min (HCC)   . 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 DDD (degenerative disc disease), cervical   . Hypercholesterolemia   . Hypertension   . Hypothyroidism    pt takes Levothyroxine daily  . Lumbosacral spinal stenosis   . Myasthenia gravis, adult form ([place])   . PAD (peripheral artery disease) ([place])   . Shortness of breath    Lung MD- Dr Darlin Coco  . Sleep apnea    do not use CPAP every night    Past Surgical History:  Procedure Laterality Date  . ANTERIOR CERVICAL DECOMP/DISCECTOMY FUSION  07/18/2011   Procedure: ANTERIOR CERVICAL DECOMPRESSION/DISCECTOMY FUSION 2 LEVELS;  Surgeon: Cooper Render Pool;  Location: [place] [facility];  Service: Neurosurgery;  Laterality: N/A;  cervical five-six, cervical six-seven anterior cervical discectomy and fusion  . BACK SURGERY     in [place]     01/2020 Right, 04/2020 Left  . CARDIAC CATHETERIZATION     2005 at [facility], no  stents  . CAROTID PTA/STENT INTERVENTION N/A 09/17/2020   Procedure: CAROTID PTA/STENT INTERVENTION;  Surgeon: Algernon Huxley, MD;  Location: [place] CV LAB;  Service: Cardiovascular;  Laterality: N/A;  . CATARACT EXTRACTION W/PHACO Left 01/06/2020   Procedure: CATARACT EXTRACTION PHACO AND INTRAOCULAR LENS PLACEMENT (IOC) ISTENT INJ LEFT 3.81  00:33.3;  Surgeon: Eulogio Bear, MD;  Location: [place];  Service: Ophthalmology;  Laterality: Left;  . CATARACT EXTRACTION W/PHACO Right 02/03/2020   Procedure: CATARACT EXTRACTION PHACO AND INTRAOCULAR LENS PLACEMENT ([place]) RIGHT ISTENT INJ;  Surgeon: Eulogio Bear, MD;  Location: [place];  Service: Ophthalmology;  Laterality: Right;  4.29 0:35.6  . COLONOSCOPY    . HERNIA REPAIR Left    inguinal hernia repair in 1985  . LUMBAR LAMINECTOMY/DECOMPRESSION MICRODISCECTOMY Left 02/24/2014   Procedure: LUMBAR LAMINECTOMY/DECOMPRESSION MICRODISCECTOMY LUMBAR THREE-FOUR, FOUR-FIVE, LEFT FIVE-SACRAL ONE ;  Surgeon: Charlie Pitter, MD;  Location: [place] [facility]UR[facility] ORS;  Service: Neurosurgery;  Laterality: Left;  LUMBAR LAMINECTOMY/DECOMPRESSION MICRODISCECTOMY LUMBAR THREE-FOUR, FOUR-FIVE, LEFT FIVE-SACRAL ONE   . POSTERIOR CERVICAL FUSION/FORAMINOTOMY N/A 08/07/2020   Procedure: C3-6 POSTERIOR FUSION WITH DECOMPRESSION;  Surgeon: Meade Maw, MD;  Location: [facility] ORS;  Service: Neurosurgery;  Laterality: N/A;  . PROSTATECTOMY  8/14   [facility] Dr Mare Ferrari     There  were no vitals filed for this visit.   Subjective Assessment - 12/03/20 1329    Subjective  Pt reports he is not feeling as well today and difficulty with mobility.  Some low back pain on left 3/10    Pertinent History Pt is a 75 y/o M with hx of cervical myelopathy & admitted on 08/07/20 for scheduled C3-6 Posterior fusion with decompression by Dr. Izora Ribas. PMH: atypical angina, PAD, carotid stenosis, HTN, HLD, COPD, Myasthenia gravis, hypothyroidism, CKD-III, OSA  requiring nocturnal PAP therapy, OA, cervical DDD, lumbosacral spinal stenosis, chronic DOE 2/2 COPD.  Pt went to IP rehab and was discharged on 08/20/2020 with orders for OP OT and PT.  History of carpal tunnel bilaterally.    Patient Stated Goals Pt would like to be able to walk again, and be independent as he was before.    Currently in Pain? Yes    Pain Score 3     Pain Location Back    Pain Orientation Left    Pain Descriptors / Indicators Aching    Pain Onset More than a month ago    Pain Frequency Intermittent          Therapeutic Exercise: Pt seen for strengthening with use of UBE performed from a seated position, forwards/backwards, alternating levels of resistance from 2.0 to 2.5 for 8 mins total, short rest breaks as needed.  Focus on sustained gripping patterns with hand gripper, right hand with 3rd setting and then 4th setting, left hand with 3rd setting only, 20 reps with each trial.   Finger strengthening with use of push pins placing into moderate resistance bulletin board, completed 25 reps, difficulty with pushing pins all the way flush with the board.  Patient performing resistive pinch skills with use of pinch pins from yellow lightest level to black hardest level of pinch.  Pt to place onto dowel stick presented in a variety of planes of motion to encourage reaching patterns.     Response to tx:  Pt with low energy today so adjusted activities accordingly to accommodate.  Patient continues to progress with strengthening tasks, difficulty with moderate resistive bulletin board and pins to fully push pins into board, able to remove without difficulty.  Pt is challenged with black resistive pinch pin and especially when challenged to reach in a variety of planes to place the pin.  Pt required short rest breaks today.  Supervision with toileting this date from transport chair.                       OT Education - 12/03/20 1330    Education Details  strengthening    Person(s) Educated Patient    Methods Explanation;Demonstration;Handout;Verbal cues;Tactile cues    Comprehension Verbalized understanding;Returned demonstration;Verbal cues required               OT Long Term Goals - 10/06/20 1113      OT LONG TERM GOAL #1   Title Patient to be independent with home exercise program for strengthening and coordination skills.    Baseline decreased strength and coordination bilaterally after cervical surgery    Time 6    Period Weeks    Status On-going    Target Date 12/29/20      OT LONG TERM GOAL #2   Title Patient will improve grip strength bilaterally by 8# to hold objects securely without dropping.    Baseline Improving grip strength. Refer to [employer]    Time 12  Period Weeks    Status On-going    Target Date 12/29/20      OT LONG TERM GOAL #3   Title Patient will perform self care tasks with modified independence.    Baseline Independent with morning care    Status Achieved      OT LONG TERM GOAL #4   Title Pt will complete light homemaking tasks with modified independence.    Baseline wife conitnues to assist    Time 12    Period Weeks    Status On-going    Target Date 12/29/20      OT LONG TERM GOAL #5   Title Patient will improve bilateral shoulder ROM and strength to place items on shelves at shoulder height with modified independence.    Baseline Pt. is able to reach items on shelves.    Status Achieved      OT LONG TERM GOAL #6   Title Pt will improve left UE coordination by decreasing time on 9 hole peg test by 10 secs to be able to manipulate and complete buttons with modified independence.    Baseline Pt. continues to have difficulty.refer to flow sheet for measurements    Time 12    Status On-going    Target Date 12/29/20                 Plan - 12/03/20 1331    Clinical Impression Statement Pt with low energy today so adjusted activities accordingly to accommodate.  Patient  continues to progress with strengthening tasks, difficulty with moderate resistive bulletin board and pins to fully push pins into board, able to remove without difficulty.  Pt is challenged with black resistive pinch pin and especially when challenged to reach in a variety of planes to place the pin.  Pt required short rest breaks today.  Supervision with toileting this date from transport chair.    OT Occupational Profile and History Detailed Assessment- Review of Records and additional review of physical, cognitive, psychosocial history related to current functional performance    Occupational performance deficits (Please refer to evaluation for details): ADL's;IADL's;Leisure;Work    Marketing executive / Function / Physical Skills ADL;Flexibility;ROM;UE functional use;FMC;Dexterity;Sensation;Strength;IADL;Coordination;Mobility;Decreased knowledge of use of DME;Balance;Endurance    Psychosocial Skills Environmental  Adaptations;Habits;Routines and Behaviors    Rehab Potential Good    Clinical Decision Making Several treatment options, min-mod task modification necessary    Comorbidities Affecting Occupational Performance: Presence of comorbidities impacting occupational performance    Comorbidities impacting occupational performance description: age, carpal tunnel history, COPD, chronic lumbar surgical history, history of myasthenia gravis, has a full time job and working currently, unable to drive    Modification or Assistance to Complete Evaluation  No modification of tasks or assist necessary to complete eval    OT Frequency 2x / week    OT Duration 6 weeks    OT Treatment/Interventions Self-care/ADL training;Therapeutic exercise;Neuromuscular education;Patient/family education;Therapeutic activities;Cryotherapy;Functional Mobility Training;DME and/or AE instruction;Balance training;Moist Heat    Consulted and Agree with Plan of Care Patient           Patient will benefit from skilled therapeutic  intervention in order to improve the following deficits and impairments:   Body Structure / Function / Physical Skills: ADL,Flexibility,ROM,UE functional use,FMC,Dexterity,Sensation,Strength,IADL,Coordination,Mobility,Decreased knowledge of use of DME,Balance,Endurance   Psychosocial Skills: Environmental  Adaptations,Habits,Routines and Behaviors   Visit Diagnosis: Muscle weakness (generalized)  Other lack of coordination  Unsteadiness on feet    Problem List Patient Active Problem List   Diagnosis  Date Noted  . Carotid stenosis, right 09/17/2020  . S/P cervical spinal fusion   . Leukocytosis   . Essential hypertension   . Anemia of chronic disease   . Postoperative pain   . Neuropathic pain   . Cervical myelopathy ([place]) 08/07/2020  . Preop cardiovascular exam 07/17/2020  . SOB (shortness of breath) on exertion 07/17/2020  . Leg weakness, bilateral 07/13/2020  . Aortic atherosclerosis ([place]) 07/09/2020  . Myalgia 10/30/2019  . PAD (peripheral artery disease) ([place]) 06/06/2019  . CKD (chronic kidney disease) stage 3, GFR 30-59 ml/min (HCC) 04/29/2019  . B12 deficiency 01/23/2019  . Left arm numbness 02/28/2018  . Neck pain 02/28/2018  . Anemia 02/02/2017  . DDD (degenerative disc disease), cervical 02/02/2017  . Hypothyroid 02/02/2017  . MRSA (methicillin resistant staph aureus) culture positive 02/02/2017  . Nocturnal hypoxia 02/02/2017  . Senile purpura ([place]) 10/03/2016  . Essential hypertension, benign 09/16/2016  . Bilateral carotid artery disease ([place]) 09/16/2016  . 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/18/2016  . 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 History of prostate cancer 12/21/2015  . Left carpal tunnel syndrome 11/11/2015  . Kidney stone on left side 07/05/2015  . Myasthenia gravis ([place]) 05/26/2015  . Long-term use of high-risk medication 10/15/2014  . Persistent cough 09/10/2014  . Pure hypercholesterolemia 07/25/2014  . Spinal stenosis, lumbar  region, with neurogenic claudication 02/24/2014  . Lumbosacral stenosis with neurogenic claudication ([place]) 02/24/2014  . COPD (chronic obstructive pulmonary disease) ([place]) 01/22/2014   Matthew Cina T Tomasita Morrow, OTR/L, CLT  Jaxie Racanelli 12/03/2020, 1:58 PM  [place] MAIN [facility] SERVICES [address] [place], [place], [address] Phone: [phone]   Fax:  [phone]  Name: NEHAL SHIVES MRN: [id number] Date of Birth: [birth year]

## 2020-12-03 NOTE — Therapy (Signed)
[place] MAIN [facility] SERVICES [address] [place], [place], [address] Phone: [phone]   Fax:  [phone]  Physical Therapy Treatment  Patient Details  Name: Francisco [name] MRN: [id number] Date of Birth: 05/14/1946 Referring Provider (PT): Lauraine Rinne, PA-C   Encounter Date: 12/03/2020   PT End of Session - 12/03/20 1540    Visit Number 19    Number of Visits 25    Date for PT Re-Evaluation 12/29/20    Authorization Type [employer] Medicare    Authorization Time Period 08/25/20-11/17/20; 10/06/20-12/29/20    PT Start Time 1346    PT Stop Time 1425    PT Time Calculation (min) 39 min    Equipment Utilized During Treatment Gait belt    Activity Tolerance Patient tolerated treatment well;No increased pain;Patient limited by fatigue    Behavior During Therapy [facility] for tasks assessed/performed           Past Medical History:  Diagnosis Date  . Arthritis    lower left hip  . Atypical angina ([place])   . Bilateral hand numbness    from back surgery  . Bronchitis, chronic ([place])   . Cancer [facility])    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; Merkel cell cancer, and Basal cell cancer (twice; 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  . Carotid stenosis   . CKD (chronic kidney disease) stage 3, GFR 30-59 ml/min (HCC)   . 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 DDD (degenerative disc disease), cervical   . Hypercholesterolemia   . Hypertension   . Hypothyroidism    pt takes Levothyroxine daily  . Lumbosacral spinal stenosis   . Myasthenia gravis, adult form ([place])   . PAD (peripheral artery disease) ([place])   . Shortness of breath    Lung MD- Dr Darlin Coco  . Sleep apnea    do not use CPAP every night    Past Surgical History:  Procedure Laterality Date  . ANTERIOR CERVICAL DECOMP/DISCECTOMY FUSION  07/18/2011   Procedure: ANTERIOR CERVICAL DECOMPRESSION/DISCECTOMY FUSION 2 LEVELS;  Surgeon: Cooper Render Pool;  Location: [place] NEURO ORS;  Service:  Neurosurgery;  Laterality: N/A;  cervical five-six, cervical six-seven anterior cervical discectomy and fusion  . BACK SURGERY     in [place]     01/2020 Right, 04/2020 Left  . CARDIAC CATHETERIZATION     2005 at [facility], no stents  . CAROTID PTA/STENT INTERVENTION N/A 09/17/2020   Procedure: CAROTID PTA/STENT INTERVENTION;  Surgeon: Algernon Huxley, MD;  Location: [place] CV LAB;  Service: Cardiovascular;  Laterality: N/A;  . CATARACT EXTRACTION W/PHACO Left 01/06/2020   Procedure: CATARACT EXTRACTION PHACO AND INTRAOCULAR LENS PLACEMENT (IOC) ISTENT INJ LEFT 3.81  00:33.3;  Surgeon: Eulogio Bear, MD;  Location: [place];  Service: Ophthalmology;  Laterality: Left;  . CATARACT EXTRACTION W/PHACO Right 02/03/2020   Procedure: CATARACT EXTRACTION PHACO AND INTRAOCULAR LENS PLACEMENT ([place]) RIGHT ISTENT INJ;  Surgeon: Eulogio Bear, MD;  Location: [place];  Service: Ophthalmology;  Laterality: Right;  4.29 0:35.6  . COLONOSCOPY    . HERNIA REPAIR Left    inguinal hernia repair in 1985  . LUMBAR LAMINECTOMY/DECOMPRESSION MICRODISCECTOMY Left 02/24/2014   Procedure: LUMBAR LAMINECTOMY/DECOMPRESSION MICRODISCECTOMY LUMBAR THREE-FOUR, FOUR-FIVE, LEFT FIVE-SACRAL ONE ;  Surgeon: Charlie Pitter, MD;  Location: [place] NEURO ORS;  Service: Neurosurgery;  Laterality: Left;  LUMBAR LAMINECTOMY/DECOMPRESSION MICRODISCECTOMY LUMBAR THREE-FOUR, FOUR-FIVE, LEFT FIVE-SACRAL ONE   .  POSTERIOR CERVICAL FUSION/FORAMINOTOMY N/A 08/07/2020   Procedure: C3-6 POSTERIOR FUSION WITH DECOMPRESSION;  Surgeon: Meade Maw, MD;  Location: [facility] ORS;  Service: Neurosurgery;  Laterality: N/A;  . PROSTATECTOMY  8/14   [facility] Dr Mare Ferrari     Vitals:   12/03/20 1538  BP: (!) 141/65  Pulse: 61     Subjective Assessment - 12/03/20 1539    Subjective Pt rates back pain as 3/10 today. Pt reports feeling weak.    Pertinent History [name] is  a 60yoM referred to OPPT neuro s/p myelopathy s/p cervical decompression and fusion. Pt sustained several months of general decline in mobility prior to surgical decompression. Pt went to inpatient rehab after hospitalization. [facility].  Pt underwent C3-6 decompression, fusion on 11/26. PMH: myasthenia gravis, 2012 ACDF 5/6, 6/7; OSA, hypothyroidism, HTN, COPD, multiple back surgeries, 2021 carpal tunnel release, referred to OPOT, but only seen for evaluation. 8/10 OPPT evaluation for LSS c radiating pain into the left hip, only completed evaluation session. September - November progressive decline in leg strength and tolerance to upright activity. Also noted a decline in tool manipulation for ADL (pen and meal utensil). Seen by OT and PT as an outpatient for other potential etiology (Carpal tunnel syndrome, and lumbar spinal stenosis), but eventually identified to be related to cervical myelopathy. Several months prior has no limitations in mobility or function. Pt scheduled for Jan 6th: Carotid endarterectomy c Dr. Leotis Pain. Pt has a long history of lumbar DJD, s/p L5/S1 decompression discectomy x2, L5 nerve root injections. Most recent Lumbar MRI (11/10/18) revealin gof advanced formaminal stenosis of L3/4, L4/5, L5/S1. Pt is has had LLE neurological weakness and pain since 2015 and has been followed by neurosurgery.    Currently in Pain? Yes    Pain Score 3     Pain Location Back           TREATMENT   Neuro Re-ed: In // bars airex beam: static stance WBOS 2x 30 seconds, CGA-min assist; intermittent UE support  airex beam: NBOS 4x30 sec; CGA-min assist; intermittent UE support  Side-stepping on airex beam 2x back and forth; CGA-min assist; intermittent UE support  Therex: Seated: 2lb ankle weight for LAQ 12x each LE  LAQ without AW 1x12, 1x6 for each LE  Adduction ball squeeze 3x10 with 2-3 sec holds  STS without UE support 1x5   Education provided throughout  session in the form of demonstration, VC/TC to facilitate movement at target joints and correct muscle activation with exercises.      PT Education - 12/03/20 1539    Education provided Yes    Education Details exercise technique, body mechanics    Person(s) Educated Patient    Methods Explanation;Demonstration;Verbal cues    Comprehension Verbalized understanding;Returned demonstration            PT Short Term Goals - 10/21/20 1534      PT SHORT TERM GOAL #1   Title After 6 weeks patient will be independent in HEP to progress strength, AMB, and balance.    Baseline 2/9: HEP compliant    Time 6    Period Weeks    Status Partially Met    Target Date 11/17/20      PT SHORT TERM GOAL #2   Title After 6 weeks pt will demonstrate improved 5xSTS hands free chair + airex foam in <20sec    Baseline 01/25: 31s hands free without airex foam 2/9: unable to do on airex  Time 6    Period Weeks    Status On-going    Target Date 11/17/20             PT Long Term Goals - 12/01/20 1423      PT LONG TERM GOAL #1   Title Patient will reduce timed up and go to <11 seconds to reduce fall risk and demonstrate improved transfer/gait ability.    Baseline 01/25: 30.26s 2/9: 19.55 seconds with RW    Time 12    Period Weeks    Status Partially Met    Target Date 12/29/20      PT LONG TERM GOAL #2   Title Patient will demonstrate 6MWT (LRAD) >754f to demonstrate improved capacity to access the community.    Baseline 12/16: 120 ft, 01/25: 165 ft with 1 seated rest break for 2 mins 2/9: 395 ft with RW and one seated rest break    Time 12    Period Weeks    Status Partially Met    Target Date 12/29/20      PT LONG TERM GOAL #3   Title Patient will increase 10 meter walk test to >1.068m as to improve gait speed for better community ambulation and to reduce fall risk.    Baseline 01/25: 0.3826m2/9: 0.81 m/s with RW    Time 12    Period Weeks    Status Partially Met    Target Date  12/29/20      PT LONG TERM GOAL #4   Title After 12 weeks pt to demonstrate improved FOTO score >70    Baseline 01/25: 51 2/9: 55.97 %    Time 12    Period Weeks    Status Partially Met    Target Date 12/29/20      PT LONG TERM GOAL #5   Title Patient (> 60 30ars old) will complete five times sit to stand test in <12.5 seconds indicating an increased LE strength and improved balance.    Baseline 01/25: 31s;  2/9: hands on knees 13.09; 13.16sec hands-free    Time 12    Period Weeks    Status New    Target Date 12/29/20                 Plan - 12/03/20 1540    Clinical Impression Statement Pt limited by fatigue this session and able to perform limited number of standing interventions. Pt reqiures frequent brief rest breaks with balance tasks. Pt shows difficulty with maintaining postural stability wtih airex beam exercises (both static and dynamic) requiring CGA-min assist and UE support to maintain balance. Pt will continue to benefit from further skilled therapy to improve balance and LE strength to improve ease with ADLs and QOL.    Personal Factors and Comorbidities Age;Comorbidity 2;Past/Current Experience;Time since onset of injury/illness/exacerbation    Comorbidities COPD, History of Cancer, myasthenia gravis, chronic lumbar surgical history.    Examination-Activity Limitations Lift;Squat;Bend;Stairs;Stand;Transfers;LocInsurance claims handlerthing;Hygiene/Grooming;Dressing;Toileting    Examination-Participation Restrictions Yard Work;Church;Cleaning;Driving    Stability/Clinical Decision Making Stable/Uncomplicated    Rehab Potential Good    Clinical Impairments Affecting Rehab Potential PMH    PT Frequency 2x / week    PT Duration 12 weeks    PT Treatment/Interventions ADLs/Self Care Home Management;Electrical Stimulation;Moist Heat;Traction;Ultrasound;Gait training;Stair training;Functional mobility training;Therapeutic activities;Therapeutic  exercise;Balance training;Neuromuscular re-education;Patient/family education;Manual techniques;Passive range of motion;Dry needling;Joint Manipulations;Cryotherapy    PT Next Visit Plan balance, strength; progress standing balance, dynamic balance    PT Home Exercise Plan HEP:  seated marches, seated heel raises, seated LAQ    Consulted and Agree with Plan of Care Patient           Patient will benefit from skilled therapeutic intervention in order to improve the following deficits and impairments:  Abnormal gait,Decreased balance,Decreased mobility,Decreased endurance,Difficulty walking,Hypomobility,Impaired sensation,Decreased range of motion,Impaired perceived functional ability,Decreased activity tolerance,Decreased coordination,Decreased strength,Impaired flexibility,Postural dysfunction,Pain  Visit Diagnosis: Muscle weakness (generalized)  Unsteadiness on feet     Problem List Patient Active Problem List   Diagnosis Date Noted  . Carotid stenosis, right 09/17/2020  . S/P cervical spinal fusion   . Leukocytosis   . Essential hypertension   . Anemia of chronic disease   . Postoperative pain   . Neuropathic pain   . Cervical myelopathy ([place]) 08/07/2020  . Preop cardiovascular exam 07/17/2020  . SOB (shortness of breath) on exertion 07/17/2020  . Leg weakness, bilateral 07/13/2020  . Aortic atherosclerosis ([place]) 07/09/2020  . Myalgia 10/30/2019  . PAD (peripheral artery disease) ([place]) 06/06/2019  . CKD (chronic kidney disease) stage 3, GFR 30-59 ml/min (HCC) 04/29/2019  . B12 deficiency 01/23/2019  . Left arm numbness 02/28/2018  . Neck pain 02/28/2018  . Anemia 02/02/2017  . DDD (degenerative disc disease), cervical 02/02/2017  . Hypothyroid 02/02/2017  . MRSA (methicillin resistant staph aureus) culture positive 02/02/2017  . Nocturnal hypoxia 02/02/2017  . Senile purpura ([place]) 10/03/2016  . Essential hypertension, benign 09/16/2016  . Bilateral carotid artery  disease ([place]) 09/16/2016  . 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/18/2016  . 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 History of prostate cancer 12/21/2015  . Left carpal tunnel syndrome 11/11/2015  . Kidney stone on left side 07/05/2015  . Myasthenia gravis ([place]) 05/26/2015  . Long-term use of high-risk medication 10/15/2014  . Persistent cough 09/10/2014  . Pure hypercholesterolemia 07/25/2014  . Spinal stenosis, lumbar region, with neurogenic claudication 02/24/2014  . Lumbosacral stenosis with neurogenic claudication ([place]) 02/24/2014  . COPD (chronic obstructive pulmonary disease) ([place]) 01/22/2014   Ricard Dillon PT, DPT 12/03/2020, 3:49 PM  [place] MAIN [facility] SERVICES [address] [place], [place], [address] Phone: [phone]   Fax:  [phone]  Name: MYLON MABEY MRN: [id number] Date of Birth: 1946/02/25

## 2020-12-08 ENCOUNTER — Ambulatory Visit: Payer: Medicare Other | Admitting: Occupational Therapy

## 2020-12-08 ENCOUNTER — Ambulatory Visit: Payer: Medicare Other

## 2020-12-08 ENCOUNTER — Encounter: Payer: Self-pay | Admitting: Occupational Therapy

## 2020-12-08 ENCOUNTER — Other Ambulatory Visit: Payer: Self-pay

## 2020-12-08 DIAGNOSIS — G8929 Other chronic pain: Secondary | ICD-10-CM

## 2020-12-08 DIAGNOSIS — M25632 Stiffness of left wrist, not elsewhere classified: Secondary | ICD-10-CM

## 2020-12-08 DIAGNOSIS — R278 Other lack of coordination: Secondary | ICD-10-CM

## 2020-12-08 DIAGNOSIS — G959 Disease of spinal cord, unspecified: Secondary | ICD-10-CM

## 2020-12-08 DIAGNOSIS — M6281 Muscle weakness (generalized): Secondary | ICD-10-CM | POA: Diagnosis not present

## 2020-12-08 DIAGNOSIS — R2681 Unsteadiness on feet: Secondary | ICD-10-CM

## 2020-12-08 NOTE — Therapy (Signed)
[place] MAIN [facility] SERVICES [address] [place], [place], [address] Phone: [phone]   Fax:  [phone]  Physical Therapy Treatment Physical Therapy Progress Note   Dates of reporting period  10/21/20  to   12/08/20  Patient Details  Name: Francisco [name] MRN: [id number] Date of Birth: 1946/04/18 Referring Provider (PT): Lauraine Rinne, PA-C   Encounter Date: 12/08/2020   PT End of Session - 12/08/20 1350    Visit Number 20    Number of Visits 25    Date for PT Re-Evaluation 12/29/20    Authorization Type [employer] Medicare; next session 1/10 Pn 12/08/20    Authorization Time Period 08/25/20-11/17/20; 10/06/20-12/29/20    PT Start Time 1347    PT Stop Time 1428    PT Time Calculation (min) 41 min    Equipment Utilized During Treatment Gait belt    Activity Tolerance Patient tolerated treatment well;No increased pain;Patient limited by fatigue    Behavior During Therapy [facility] for tasks assessed/performed           Past Medical History:  Diagnosis Date  . Arthritis    lower left hip  . Atypical angina ([place])   . Bilateral hand numbness    from back surgery  . Bronchitis, chronic ([place])   . Cancer [facility])    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; Merkel cell cancer, and Basal cell cancer (twice; 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  . Carotid stenosis   . CKD (chronic kidney disease) stage 3, GFR 30-59 ml/min (HCC)   . 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 DDD (degenerative disc disease), cervical   . Hypercholesterolemia   . Hypertension   . Hypothyroidism    pt takes Levothyroxine daily  . Lumbosacral spinal stenosis   . Myasthenia gravis, adult form ([place])   . PAD (peripheral artery disease) ([place])   . Shortness of breath    Lung MD- Dr Darlin Coco  . Sleep apnea    do not use CPAP every night    Past Surgical History:  Procedure Laterality Date  . ANTERIOR CERVICAL DECOMP/DISCECTOMY FUSION  07/18/2011   Procedure: ANTERIOR  CERVICAL DECOMPRESSION/DISCECTOMY FUSION 2 LEVELS;  Surgeon: Cooper Render Pool;  Location: [place] NEURO ORS;  Service: Neurosurgery;  Laterality: N/A;  cervical five-six, cervical six-seven anterior cervical discectomy and fusion  . BACK SURGERY     in [place]     01/2020 Right, 04/2020 Left  . CARDIAC CATHETERIZATION     2005 at [facility], no stents  . CAROTID PTA/STENT INTERVENTION N/A 09/17/2020   Procedure: CAROTID PTA/STENT INTERVENTION;  Surgeon: Algernon Huxley, MD;  Location: [place] CV LAB;  Service: Cardiovascular;  Laterality: N/A;  . CATARACT EXTRACTION W/PHACO Left 01/06/2020   Procedure: CATARACT EXTRACTION PHACO AND INTRAOCULAR LENS PLACEMENT (IOC) ISTENT INJ LEFT 3.81  00:33.3;  Surgeon: Eulogio Bear, MD;  Location: [place];  Service: Ophthalmology;  Laterality: Left;  . CATARACT EXTRACTION W/PHACO Right 02/03/2020   Procedure: CATARACT EXTRACTION PHACO AND INTRAOCULAR LENS PLACEMENT ([place]) RIGHT ISTENT INJ;  Surgeon: Eulogio Bear, MD;  Location: [place];  Service: Ophthalmology;  Laterality: Right;  4.29 0:35.6  . COLONOSCOPY    . HERNIA REPAIR Left    inguinal hernia repair in 1985  . LUMBAR LAMINECTOMY/DECOMPRESSION MICRODISCECTOMY Left 02/24/2014   Procedure: LUMBAR LAMINECTOMY/DECOMPRESSION MICRODISCECTOMY LUMBAR THREE-FOUR, FOUR-FIVE, LEFT FIVE-SACRAL ONE ;  Surgeon: Charlie Pitter, MD;  Location: [place] NEURO ORS;  Service: Neurosurgery;  Laterality: Left;  LUMBAR LAMINECTOMY/DECOMPRESSION MICRODISCECTOMY LUMBAR THREE-FOUR, FOUR-FIVE, LEFT FIVE-SACRAL ONE   . POSTERIOR CERVICAL FUSION/FORAMINOTOMY N/A 08/07/2020   Procedure: C3-6 POSTERIOR FUSION WITH DECOMPRESSION;  Surgeon: Meade Maw, MD;  Location: [facility] ORS;  Service: Neurosurgery;  Laterality: N/A;  . PROSTATECTOMY  8/14   [facility] Dr Mare Ferrari     There were no vitals filed for this visit.   Subjective Assessment - 12/08/20 1351    Subjective  Patient reports mentally he is doing fabulous but having some weakness in his legs. Low back pain from walking is centralized.    Pertinent History Francisco [name] is a 58yoM referred to OPPT neuro s/p myelopathy s/p cervical decompression and fusion. Pt sustained several months of general decline in mobility prior to surgical decompression. Pt went to inpatient rehab after hospitalization. [facility].  Pt underwent C3-6 decompression, fusion on 11/26. PMH: myasthenia gravis, 2012 ACDF 5/6, 6/7; OSA, hypothyroidism, HTN, COPD, multiple back surgeries, 2021 carpal tunnel release, referred to OPOT, but only seen for evaluation. 8/10 OPPT evaluation for LSS c radiating pain into the left hip, only completed evaluation session. September - November progressive decline in leg strength and tolerance to upright activity. Also noted a decline in tool manipulation for ADL (pen and meal utensil). Seen by OT and PT as an outpatient for other potential etiology (Carpal tunnel syndrome, and lumbar spinal stenosis), but eventually identified to be related to cervical myelopathy. Several months prior has no limitations in mobility or function. Pt scheduled for Jan 6th: Carotid endarterectomy c Dr. Leotis Pain. Pt has a long history of lumbar DJD, s/p L5/S1 decompression discectomy x2, L5 nerve root injections. Most recent Lumbar MRI (11/10/18) revealin gof advanced formaminal stenosis of L3/4, L4/5, L5/S1. Pt is has had LLE neurological weakness and pain since 2015 and has been followed by neurosurgery.    Limitations Walking;Lifting;Standing;House hold activities    How long can you sit comfortably? Not limited    How long can you stand comfortably? 3-5 minutes    How long can you walk comfortably? 10 minutes    Patient Stated Goals return to functional baseline status of Summer 2021.    Pain Score 4     Pain Location Back    Pain Orientation Lower    Pain Descriptors / Indicators Aching    Pain Type  Chronic pain    Pain Onset In the past 7 days    Pain Frequency Constant    Aggravating Factors  worsening with ambulation                Goals:   TUG: 12.36 seconds with RW  6 MWT: with RW: 520 increased crouch pattern of ambulation with prolonged ambulation ; one seated rest break after 3 minutes; reports as moderate exercise on BORG scale 10 MWT: first trial 12.4 seconds with RW; second trial 11 seconds with RW  FOTO: 62.9%  5x STS: 14.74 first trial; 12.03 seconds second trial no UE support   Treatment: seated: Adduction ball squeeze 10x 3 second holds; 3 sets  for 30 total 2.5 ankle weights on LE's:  -LAQ 10x each LE,  -march 5x each LE; unable to tolerate on LLE.    Patient's condition has the potential to improve in response to therapy. Maximum improvement is yet to be obtained. The anticipated improvement is attainable and reasonable in a generally predictable time.  Patient reports in general he is improving  but not yet where he wants to be.         PT Education - 12/08/20 1348    Education provided Yes    Education Details goals, progress note    Person(s) Educated Patient    Methods Explanation;Demonstration;Tactile cues;Verbal cues    Comprehension Verbalized understanding;Returned demonstration;Verbal cues required;Tactile cues required            PT Short Term Goals - 12/08/20 1357      PT SHORT TERM GOAL #1   Title After 6 weeks patient will be independent in HEP to progress strength, AMB, and balance.    Baseline 2/9: HEP compliant 3/29: HEP compliant    Time 6    Period Weeks    Status Achieved    Target Date 11/17/20      PT SHORT TERM GOAL #2   Title After 6 weeks pt will demonstrate improved 5xSTS hands free chair + airex foam in <20sec    Baseline 01/25: 31s hands free without airex foam 2/9: unable to do on airex 3/29 12.03 seconds no UE support required    Time 6    Period Weeks    Status Achieved    Target Date --              PT Long Term Goals - 12/08/20 1354      PT LONG TERM GOAL #1   Title Patient will reduce timed up and go to <11 seconds to reduce fall risk and demonstrate improved transfer/gait ability.    Baseline 01/25: 30.26s 2/9: 19.55 seconds with RW 3/29: 12.36 seconds with RW    Time 12    Period Weeks    Status Partially Met    Target Date 12/29/20      PT LONG TERM GOAL #2   Title Patient will demonstrate 6MWT (LRAD) >772f to demonstrate improved capacity to access the community.    Baseline 12/16: 120 ft, 01/25: 165 ft with 1 seated rest break for 2 mins 2/9: 395 ft with RW and one seated rest break 3/29: 520 ft with RW and one seated rest break    Time 12    Period Weeks    Status Partially Met    Target Date 12/29/20      PT LONG TERM GOAL #3   Title Patient will increase 10 meter walk test to >1.026m as to improve gait speed for better community ambulation and to reduce fall risk.    Baseline 01/25: 0.3843m2/9: 0.81 m/s with RW 3/29: 0.91 m/s with RW    Time 12    Period Weeks    Status Partially Met    Target Date 12/29/20      PT LONG TERM GOAL #4   Title After 12 weeks pt to demonstrate improved FOTO score >70    Baseline 01/25: 51 2/9: 55.97 % 3/29: 62.9%    Time 12    Period Weeks    Status Partially Met    Target Date 12/29/20      PT LONG TERM GOAL #5   Title Patient (> 60 15ars old) will complete five times sit to stand test in <12.5 seconds indicating an increased LE strength and improved balance.    Baseline 01/25: 31s;  2/9: hands on knees 13.09; 13.16sec hands-free 3/29: 12.03 seconds no UE support.    Time 12    Period Weeks    Status Achieved  Plan - 12/08/20 1413    Clinical Impression Statement Patient's goals performed this session with patient highly motivated. Patient has met his sit to stand goal under 12 seconds without UE support as well as both short term goals. He has made progress in his 6 MWT with increased  ambulation tolerated, TUG, 10 MWT, and FOTO. Patient's condition has the potential to improve in response to therapy. Maximum improvement is yet to be obtained. The anticipated improvement is attainable and reasonable in a generally predictable time.    Personal Factors and Comorbidities Age;Comorbidity 2;Past/Current Experience;Time since onset of injury/illness/exacerbation    Comorbidities COPD, History of Cancer, myasthenia gravis, chronic lumbar surgical history.    Examination-Activity Limitations Lift;Squat;Bend;Stairs;Stand;Transfers;Insurance claims handler;Bathing;Hygiene/Grooming;Dressing;Toileting    Examination-Participation Restrictions Yard Work;Church;Cleaning;Driving    Stability/Clinical Decision Making Stable/Uncomplicated    Rehab Potential Good    Clinical Impairments Affecting Rehab Potential PMH    PT Frequency 2x / week    PT Duration 12 weeks    PT Treatment/Interventions ADLs/Self Care Home Management;Electrical Stimulation;Moist Heat;Traction;Ultrasound;Gait training;Stair training;Functional mobility training;Therapeutic activities;Therapeutic exercise;Balance training;Neuromuscular re-education;Patient/family education;Manual techniques;Passive range of motion;Dry needling;Joint Manipulations;Cryotherapy    PT Next Visit Plan balance, strength; progress standing balance, dynamic balance    PT Home Exercise Plan HEP: seated marches, seated heel raises, seated LAQ    Consulted and Agree with Plan of Care Patient           Patient will benefit from skilled therapeutic intervention in order to improve the following deficits and impairments:  Abnormal gait,Decreased balance,Decreased mobility,Decreased endurance,Difficulty walking,Hypomobility,Impaired sensation,Decreased range of motion,Impaired perceived functional ability,Decreased activity tolerance,Decreased coordination,Decreased strength,Impaired flexibility,Postural dysfunction,Pain  Visit  Diagnosis: Unsteadiness on feet  Chronic left-sided low back pain with left-sided sciatica  Cervical myelopathy (HCC)  Muscle weakness (generalized)     Problem List Patient Active Problem List   Diagnosis Date Noted  . Carotid stenosis, right 09/17/2020  . S/P cervical spinal fusion   . Leukocytosis   . Essential hypertension   . Anemia of chronic disease   . Postoperative pain   . Neuropathic pain   . Cervical myelopathy ([place]) 08/07/2020  . Preop cardiovascular exam 07/17/2020  . SOB (shortness of breath) on exertion 07/17/2020  . Leg weakness, bilateral 07/13/2020  . Aortic atherosclerosis ([place]) 07/09/2020  . Myalgia 10/30/2019  . PAD (peripheral artery disease) ([place]) 06/06/2019  . CKD (chronic kidney disease) stage 3, GFR 30-59 ml/min (HCC) 04/29/2019  . B12 deficiency 01/23/2019  . Left arm numbness 02/28/2018  . Neck pain 02/28/2018  . Anemia 02/02/2017  . DDD (degenerative disc disease), cervical 02/02/2017  . Hypothyroid 02/02/2017  . MRSA (methicillin resistant staph aureus) culture positive 02/02/2017  . Nocturnal hypoxia 02/02/2017  . Senile purpura ([place]) 10/03/2016  . Essential hypertension, benign 09/16/2016  . Bilateral carotid artery disease ([place]) 09/16/2016  . 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/18/2016  . 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 History of prostate cancer 12/21/2015  . Left carpal tunnel syndrome 11/11/2015  . Kidney stone on left side 07/05/2015  . Myasthenia gravis ([place]) 05/26/2015  . Long-term use of high-risk medication 10/15/2014  . Persistent cough 09/10/2014  . Pure hypercholesterolemia 07/25/2014  . Spinal stenosis, lumbar region, with neurogenic claudication 02/24/2014  . Lumbosacral stenosis with neurogenic claudication ([place]) 02/24/2014  . COPD (chronic obstructive pulmonary disease) ([place]) 01/22/2014   Janna Arch, PT, DPT   12/08/2020, 2:29 PM  [place] MAIN [facility]  SERVICES [place], [place],  [place] Phone: [phone]   Fax:  [phone]  Name: GIANNIS CORPUZ MRN: [id number] Date of Birth: 1945-09-25

## 2020-12-08 NOTE — Therapy (Signed)
[place] MAIN [facility] SERVICES [address] [place], [place], [address] Phone: [phone]   Fax:  [phone]  Occupational Therapy Progress Note  Dates of reporting period  10/06/20   to   12/08/20  Patient Details  Name: Francisco [name] MRN: [id number] Date of Birth: Oct 03, 1945 Referring Provider (OT): Dr Annette Stable   Encounter Date: 12/08/2020   OT End of Session - 12/08/20 1310    Visit Number 10    Number of Visits 12    Date for OT Re-Evaluation 12/29/20    OT Start Time 6222    OT Stop Time 1345    OT Time Calculation (min) 40 min    Activity Tolerance Patient tolerated treatment well    Behavior During Therapy [facility] for tasks assessed/performed           Past Medical History:  Diagnosis Date  . Arthritis    lower left hip  . Atypical angina ([place])   . Bilateral hand numbness    from back surgery  . Bronchitis, chronic ([place])   . Cancer [facility])    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; Merkel cell cancer, and Basal cell cancer (twice; 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  . Carotid stenosis   . CKD (chronic kidney disease) stage 3, GFR 30-59 ml/min (HCC)   . 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 DDD (degenerative disc disease), cervical   . Hypercholesterolemia   . Hypertension   . Hypothyroidism    pt takes Levothyroxine daily  . Lumbosacral spinal stenosis   . Myasthenia gravis, adult form ([place])   . PAD (peripheral artery disease) ([place])   . Shortness of breath    Lung MD- Dr Darlin Coco  . Sleep apnea    do not use CPAP every night    Past Surgical History:  Procedure Laterality Date  . ANTERIOR CERVICAL DECOMP/DISCECTOMY FUSION  07/18/2011   Procedure: ANTERIOR CERVICAL DECOMPRESSION/DISCECTOMY FUSION 2 LEVELS;  Surgeon: Cooper Render Pool;  Location: [place] NEURO ORS;  Service: Neurosurgery;  Laterality: N/A;  cervical five-six, cervical six-seven anterior cervical discectomy and fusion  . BACK SURGERY     in [place]     01/2020 Right, 04/2020 Left  . CARDIAC CATHETERIZATION     2005 at [facility], no stents  . CAROTID PTA/STENT INTERVENTION N/A 09/17/2020   Procedure: CAROTID PTA/STENT INTERVENTION;  Surgeon: Algernon Huxley, MD;  Location: [place] CV LAB;  Service: Cardiovascular;  Laterality: N/A;  . CATARACT EXTRACTION W/PHACO Left 01/06/2020   Procedure: CATARACT EXTRACTION PHACO AND INTRAOCULAR LENS PLACEMENT (IOC) ISTENT INJ LEFT 3.81  00:33.3;  Surgeon: Eulogio Bear, MD;  Location: [place];  Service: Ophthalmology;  Laterality: Left;  . CATARACT EXTRACTION W/PHACO Right 02/03/2020   Procedure: CATARACT EXTRACTION PHACO AND INTRAOCULAR LENS PLACEMENT ([place]) RIGHT ISTENT INJ;  Surgeon: Eulogio Bear, MD;  Location: [place];  Service: Ophthalmology;  Laterality: Right;  4.29 0:35.6  . COLONOSCOPY    . HERNIA REPAIR Left    inguinal hernia repair in 1985  . LUMBAR LAMINECTOMY/DECOMPRESSION MICRODISCECTOMY Left 02/24/2014   Procedure: LUMBAR LAMINECTOMY/DECOMPRESSION MICRODISCECTOMY LUMBAR THREE-FOUR, FOUR-FIVE, LEFT FIVE-SACRAL ONE ;  Surgeon: Charlie Pitter, MD;  Location: [place] NEURO ORS;  Service: Neurosurgery;  Laterality: Left;  LUMBAR LAMINECTOMY/DECOMPRESSION MICRODISCECTOMY LUMBAR THREE-FOUR, FOUR-FIVE, LEFT FIVE-SACRAL ONE   . POSTERIOR CERVICAL FUSION/FORAMINOTOMY N/A 08/07/2020   Procedure: C3-6 POSTERIOR FUSION WITH DECOMPRESSION;  Surgeon: Meade Maw, MD;  Location: [facility] ORS;  Service: Neurosurgery;  Laterality: N/A;  . PROSTATECTOMY  8/14   [facility] Dr Mare Ferrari     There were no vitals filed for this visit.   Subjective Assessment - 12/08/20 1309    Subjective  Pt reports his legs feel weak, worse the last few days.    Pertinent History Pt is a 75 y/o M with hx of cervical myelopathy & admitted on 08/07/20 for scheduled C3-6 Posterior fusion with decompression by Dr. Izora Ribas. PMH: atypical angina, PAD, carotid stenosis,  HTN, HLD, COPD, Myasthenia gravis, hypothyroidism, CKD-III, OSA requiring nocturnal PAP therapy, OA, cervical DDD, lumbosacral spinal stenosis, chronic DOE 2/2 COPD.  Pt went to IP rehab and was discharged on 08/20/2020 with orders for OP OT and PT.  History of carpal tunnel bilaterally.    Patient Stated Goals Pt would like to be able to walk again, and be independent as he was before.    Currently in Pain? No/denies    Pain Onset More than a month ago              [facility] OT Assessment - 12/08/20 1314      Hand Function   Right Hand Grip (lbs) 65    Right Hand Lateral Pinch 15 lbs    Right Hand 3 Point Pinch 16 lbs    Left Hand Grip (lbs) 63    Left Hand Lateral Pinch 13 lbs    Left 3 point pinch 14 lbs            Therapeutic Activities Pt worked on bilateral [facility] skills grasping 1/8" hollow beads. Pt worked on storing the objects in the palm and translatory skills moving the items from the palm of the hand to the tip of the 2nd digit and thumb. Completed with 10 beads in L then R palm, increased difficulty with L hand 2/2 decreased supination. Pt worked on threading beads onto toothpick while maintaining grasp on beads in hand. Pt requires cues for technique and achieving full tipi to tip pinch.   Therapeutic Exercise Pt performed gross gripping with grip strengthener alternating BUE. Pt worked on sustaining grip while grasping pegs and reaching at various heights. Gripper was placed in the 4th resistive slot with the gold resistive spring. Pt requires cues for rest breaks and slowing pace t/o. Pt completed 1.5lb digi-flex for finger strengthening, cues for digit isolation.   Measurements were obtained and goals were reviewed with the patient. Pt has made progress with gross strength, improving L grip to 63#, R grip to 65#. Pt reports he completes all dressing and self care Independently and has progressed with homemaking tasks. Pt continues to present with limited functional  FMC/strength. Pt continues to need OT services to work on improving [facility] in order to improve engagement of the BUE during IADL tasks.       OT Education - 12/08/20 1310    Education Details strengthening, HEP    Person(s) Educated Patient    Methods Explanation;Demonstration;Handout;Verbal cues;Tactile cues    Comprehension Verbalized understanding;Returned demonstration;Verbal cues required               OT Long Term Goals - 12/08/20 1310      OT LONG TERM GOAL #1   Title Patient to be independent with home exercise program for strengthening and coordination skills.    Baseline decreased strength and coordination bilaterally after cervical surgery    Time 6    Period  Weeks    Status Achieved      OT LONG TERM GOAL #2   Title Patient will improve grip strength bilaterally by 8# to hold objects securely without dropping.   revise next certification   Baseline Improving grip strength. Refer to [employer] 12/08/20: R grip 65#, L grip 63#    Time 12    Period Weeks    Status Achieved      OT LONG TERM GOAL #3   Title Patient will perform self care tasks with modified independence.    Baseline Independent with morning care    Status Achieved      OT LONG TERM GOAL #4   Title Pt will complete light homemaking tasks with modified independence.    Baseline wife conitnues to assist. 3/39/22: assist for carrying heavy objects over household distance    Time 12    Period Weeks    Status Partially Met      OT LONG TERM GOAL #5   Title Patient will improve bilateral shoulder ROM and strength to place items on shelves at shoulder height with modified independence.    Baseline Pt. is able to reach items on shelves.    Status Achieved      OT LONG TERM GOAL #6   Title Pt will improve left UE coordination by decreasing time on 9 hole peg test by 10 secs to be able to manipulate and complete buttons with modified independence.    Baseline Pt. continues to have difficulty.refer  to flow sheet for measurements. 12/08/20: L - 33 sec, R - 28 sec, occassionally requires assist for small buttons on dress shirt    Time 12    Status Achieved                 Plan - 12/08/20 1310    Clinical Impression Statement Measurements were obtained and goals were reviewed with the patient. Pt has made progress with gross strength, improving L grip to 63#, R grip to 65#. Pt reports he completes all dressing and self care Independently and has progressed with homemaking tasks. Pt continues to present with limited functional FMC/strength. Pt continues to need OT services to work on improving [facility] in order to improve engagement of the BUE during IADL tasks.    OT Occupational Profile and History Detailed Assessment- Review of Records and additional review of physical, cognitive, psychosocial history related to current functional performance    Occupational performance deficits (Please refer to evaluation for details): ADL's;IADL's;Leisure;Work    Marketing executive / Function / Physical Skills ADL;Flexibility;ROM;UE functional use;FMC;Dexterity;Sensation;Strength;IADL;Coordination;Mobility;Decreased knowledge of use of DME;Balance;Endurance    Psychosocial Skills Environmental  Adaptations;Habits;Routines and Behaviors    Rehab Potential Good    Clinical Decision Making Several treatment options, min-mod task modification necessary    Comorbidities Affecting Occupational Performance: Presence of comorbidities impacting occupational performance    Comorbidities impacting occupational performance description: age, carpal tunnel history, COPD, chronic lumbar surgical history, history of myasthenia gravis, has a full time job and working currently, unable to drive    Modification or Assistance to Complete Evaluation  No modification of tasks or assist necessary to complete eval    OT Frequency 2x / week    OT Duration 6 weeks    OT Treatment/Interventions Self-care/ADL training;Therapeutic  exercise;Neuromuscular education;Patient/family education;Therapeutic activities;Cryotherapy;Functional Mobility Training;DME and/or AE instruction;Balance training;Moist Heat    Consulted and Agree with Plan of Care Patient           Patient will  benefit from skilled therapeutic intervention in order to improve the following deficits and impairments:   Body Structure / Function / Physical Skills: ADL,Flexibility,ROM,UE functional use,FMC,Dexterity,Sensation,Strength,IADL,Coordination,Mobility,Decreased knowledge of use of DME,Balance,Endurance   Psychosocial Skills: Environmental  Adaptations,Habits,Routines and Behaviors   Visit Diagnosis: Other lack of coordination  Stiffness of left wrist, not elsewhere classified  Muscle weakness (generalized)    Problem List Patient Active Problem List   Diagnosis Date Noted  . Carotid stenosis, right 09/17/2020  . S/P cervical spinal fusion   . Leukocytosis   . Essential hypertension   . Anemia of chronic disease   . Postoperative pain   . Neuropathic pain   . Cervical myelopathy ([place]) 08/07/2020  . Preop cardiovascular exam 07/17/2020  . SOB (shortness of breath) on exertion 07/17/2020  . Leg weakness, bilateral 07/13/2020  . Aortic atherosclerosis ([place]) 07/09/2020  . Myalgia 10/30/2019  . PAD (peripheral artery disease) ([place]) 06/06/2019  . CKD (chronic kidney disease) stage 3, GFR 30-59 ml/min (HCC) 04/29/2019  . B12 deficiency 01/23/2019  . Left arm numbness 02/28/2018  . Neck pain 02/28/2018  . Anemia 02/02/2017  . DDD (degenerative disc disease), cervical 02/02/2017  . Hypothyroid 02/02/2017  . MRSA (methicillin resistant staph aureus) culture positive 02/02/2017  . Nocturnal hypoxia 02/02/2017  . Senile purpura ([place]) 10/03/2016  . Essential hypertension, benign 09/16/2016  . Bilateral carotid artery disease ([place]) 09/16/2016  . 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/18/2016  . 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  History of prostate cancer 12/21/2015  . Left carpal tunnel syndrome 11/11/2015  . Kidney stone on left side 07/05/2015  . Myasthenia gravis ([place]) 05/26/2015  . Long-term use of high-risk medication 10/15/2014  . Persistent cough 09/10/2014  . Pure hypercholesterolemia 07/25/2014  . Spinal stenosis, lumbar region, with neurogenic claudication 02/24/2014  . Lumbosacral stenosis with neurogenic claudication ([place]) 02/24/2014  . COPD (chronic obstructive pulmonary disease) ([place]) 01/22/2014    Dessie Coma, M.S. OTR/L  12/08/20, 2:03 PM  ascom [phone]   [place] MAIN [facility] SERVICES [address] [place], [place], [address] Phone: [phone]   Fax:  [phone]  Name: Francisco [name] MRN: [id number] Date of Birth: [birth year]

## 2020-12-10 ENCOUNTER — Other Ambulatory Visit: Payer: Self-pay

## 2020-12-10 ENCOUNTER — Ambulatory Visit: Payer: Medicare Other

## 2020-12-10 DIAGNOSIS — R2681 Unsteadiness on feet: Secondary | ICD-10-CM

## 2020-12-10 DIAGNOSIS — G959 Disease of spinal cord, unspecified: Secondary | ICD-10-CM

## 2020-12-10 DIAGNOSIS — M6281 Muscle weakness (generalized): Secondary | ICD-10-CM

## 2020-12-10 NOTE — Therapy (Signed)
[place] MAIN [name] SERVICES [address] [place], [place], [address] Phone: [phone]   Fax:  [phone]  Physical Therapy Treatment  Patient Details  Name: Francisco [name] MRN: [id number] Date of Birth: 1946-01-09 Referring Provider (PT): Francisco Rinne, PA-C   Encounter Date: 12/10/2020   PT End of Session - 12/10/20 1418    Visit Number 21    Number of Visits 25    Date for PT Re-Evaluation 12/29/20    Authorization Type [employer] Medicare;  1/10 Pn 12/08/20    Authorization Time Period 08/25/20-11/17/20; 10/06/20-12/29/20    PT Start Time 4268    PT Stop Time 1429    PT Time Calculation (min) 34 min    Equipment Utilized During Treatment Gait belt    Activity Tolerance Patient tolerated treatment well;No increased [name];Patient limited by fatigue    Behavior During Therapy [name] for tasks assessed/performed           Past Medical History:  Diagnosis Date  . Arthritis    lower left hip  . Atypical angina ([place])   . Bilateral hand numbness    from back surgery  . Bronchitis, chronic ([place])   . Cancer [name])    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; Merkel cell cancer, and Basal cell cancer (twice; 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  . Carotid stenosis   . CKD (chronic kidney disease) stage 3, GFR 30-59 ml/min (HCC)   . 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 DDD (degenerative disc disease), cervical   . Hypercholesterolemia   . Hypertension   . Hypothyroidism    pt takes Levothyroxine daily  . Lumbosacral spinal stenosis   . Myasthenia gravis, adult form ([place])   . PAD (peripheral artery disease) ([place])   . Shortness of breath    Lung MD- Dr Francisco [name]  . Sleep apnea    do not use CPAP every night    Past Surgical History:  Procedure Laterality Date  . ANTERIOR CERVICAL DECOMP/DISCECTOMY FUSION  07/18/2011   Procedure: ANTERIOR CERVICAL DECOMPRESSION/DISCECTOMY FUSION 2 LEVELS;  Surgeon: Francisco [name];  Location: [facility];   Service: Neurosurgery;  Laterality: N/A;  cervical five-six, cervical six-seven anterior cervical discectomy and fusion  . BACK SURGERY     in [place]     01/2020 Right, 04/2020 Left  . CARDIAC CATHETERIZATION     2005 at [facility], no stents  . CAROTID PTA/STENT INTERVENTION N/A 09/17/2020   Procedure: CAROTID PTA/STENT INTERVENTION;  Surgeon: Francisco Huxley, MD;  Location: [place] CV [facility];  Service: Cardiovascular;  Laterality: N/A;  . CATARACT EXTRACTION W/PHACO Left 01/06/2020   Procedure: CATARACT EXTRACTION PHACO AND INTRAOCULAR LENS PLACEMENT (IOC) ISTENT INJ LEFT 3.81  00:33.3;  Surgeon: Francisco Bear, MD;  Location: [facility];  Service: Ophthalmology;  Laterality: Left;  . CATARACT EXTRACTION W/PHACO Right 02/03/2020   Procedure: CATARACT EXTRACTION PHACO AND INTRAOCULAR LENS PLACEMENT ([place]) RIGHT ISTENT INJ;  Surgeon: Francisco Bear, MD;  Location: [facility];  Service: Ophthalmology;  Laterality: Right;  4.29 0:35.6  . COLONOSCOPY    . HERNIA REPAIR Left    inguinal hernia repair in 1985  . LUMBAR LAMINECTOMY/DECOMPRESSION MICRODISCECTOMY Left 02/24/2014   Procedure: LUMBAR LAMINECTOMY/DECOMPRESSION MICRODISCECTOMY LUMBAR THREE-FOUR, FOUR-FIVE, LEFT FIVE-SACRAL ONE ;  Surgeon: Francisco Pitter, MD;  Location: [place] [facility];  Service: Neurosurgery;  Laterality: Left;  LUMBAR LAMINECTOMY/DECOMPRESSION MICRODISCECTOMY LUMBAR THREE-FOUR, FOUR-FIVE,  LEFT FIVE-SACRAL ONE   . POSTERIOR CERVICAL FUSION/FORAMINOTOMY N/A 08/07/2020   Procedure: C3-6 POSTERIOR FUSION WITH DECOMPRESSION;  Surgeon: Francisco Maw, MD;  Location: [facility] ORS;  Service: Neurosurgery;  Laterality: N/A;  . PROSTATECTOMY  8/14   [facility] Dr Francisco [name]     There were no vitals filed for this visit.   Subjective Assessment - 12/10/20 1403    Subjective Patient is late to session due to problems at work. No falls or LOB since last session.    Pertinent  History Francisco [name] is a 50yoM referred to OPPT neuro s/p myelopathy s/p cervical decompression and fusion. Pt sustained several months of general decline in mobility prior to surgical decompression. Pt went to inpatient rehab after hospitalization. [facility].  Pt underwent C3-6 decompression, fusion on 11/26. PMH: myasthenia gravis, 2012 ACDF 5/6, 6/7; OSA, hypothyroidism, HTN, COPD, multiple back surgeries, 2021 carpal tunnel release, referred to OPOT, but only seen for evaluation. 8/10 OPPT evaluation for LSS c radiating [name] into the left hip, only completed evaluation session. September - November progressive decline in leg strength and tolerance to upright activity. Also noted a decline in tool manipulation for ADL (pen and meal utensil). Seen by OT and PT as an outpatient for other potential etiology (Carpal tunnel syndrome, and lumbar spinal stenosis), but eventually identified to be related to cervical myelopathy. Several months prior has no limitations in mobility or function. Pt scheduled for Jan 6th: Carotid endarterectomy c Dr. Leotis [name]. Pt has a long history of lumbar DJD, s/p L5/S1 decompression discectomy x2, L5 nerve root injections. Most recent Lumbar MRI (11/10/18) revealin gof advanced formaminal stenosis of L3/4, L4/5, L5/S1. Pt is has had LLE neurological weakness and [name] since 2015 and has been followed by neurosurgery.    Limitations Walking;Lifting;Standing;House hold activities    How long can you sit comfortably? Not limited    How long can you stand comfortably? 3-5 minutes    How long can you walk comfortably? 10 minutes    Patient Stated Goals return to functional baseline status of Summer 2021.    Currently in [name]? No/denies    [name] Onset In the past 7 days                TREATMENT   Neuro Re-ed: In // bars Orange hurdle: forward step over back, BUE support 10x each LE Orange hurdle: lateral step over and back 10x each LE; BUE  support airex pad: reach and throw ball at hoop x 18 ball no LOB Side-stepping on airex beam 2x back and forth; CGA-min assist; intermittent UE support   Therex: Seated: March 10x each LE Adduction ball squeeze x10 with 2-3 sec holds RTB around bilateral ankles: alternating LAQ keeping feet shoulder width apart.  STS 10x  Adduction ball squeeze between feet with LAQ 10x Sp02 monitored, dropped to 91 % after exercise   Education provided throughout session in the form of demonstration, VC/TC to facilitate movement at target joints and correct muscle activation with exercises.      Patient arrived late to physical therapy session limiting session duration. He requires frequent monitoring of oxygen levels due to low Sp02 with exercise. He remains highly motivated throughout session despite LLE weakness and fatigue. Pt will continue to benefit from further skilled therapy to improve balance and LE strength to improve ease with ADLs and QOL.                 PT Education -  12/10/20 1418    Education provided Yes    Education Details exercise technique, body mechanics    Person(s) Educated Patient    Methods Explanation;Demonstration;Tactile cues;Verbal cues    Comprehension Verbalized understanding;Returned demonstration;Verbal cues required;Tactile cues required            PT Short Term Goals - 12/08/20 1357      PT SHORT TERM GOAL #1   Title After 6 weeks patient will be independent in HEP to progress strength, AMB, and balance.    Baseline 2/9: HEP compliant 3/29: HEP compliant    Time 6    Period Weeks    Status Achieved    Target Date 11/17/20      PT SHORT TERM GOAL #2   Title After 6 weeks pt will demonstrate improved 5xSTS hands free chair + airex foam in <20sec    Baseline 01/25: 31s hands free without airex foam 2/9: unable to do on airex 3/29 12.03 seconds no UE support required    Time 6    Period Weeks    Status Achieved    Target Date --              PT Long Term Goals - 12/08/20 1354      PT LONG TERM GOAL #1   Title Patient will reduce timed up and go to <11 seconds to reduce fall risk and demonstrate improved transfer/gait ability.    Baseline 01/25: 30.26s 2/9: 19.55 seconds with RW 3/29: 12.36 seconds with RW    Time 12    Period Weeks    Status Partially Met    Target Date 12/29/20      PT LONG TERM GOAL #2   Title Patient will demonstrate 6MWT (LRAD) >782f to demonstrate improved capacity to access the community.    Baseline 12/16: 120 ft, 01/25: 165 ft with 1 seated rest break for 2 mins 2/9: 395 ft with RW and one seated rest break 3/29: 520 ft with RW and one seated rest break    Time 12    Period Weeks    Status Partially Met    Target Date 12/29/20      PT LONG TERM GOAL #3   Title Patient will increase 10 meter walk test to >1.061m as to improve gait speed for better community ambulation and to reduce fall risk.    Baseline 01/25: 0.3814m2/9: 0.81 m/s with RW 3/29: 0.91 m/s with RW    Time 12    Period Weeks    Status Partially Met    Target Date 12/29/20      PT LONG TERM GOAL #4   Title After 12 weeks pt to demonstrate improved FOTO score >70    Baseline 01/25: 51 2/9: 55.97 % 3/29: 62.9%    Time 12    Period Weeks    Status Partially Met    Target Date 12/29/20      PT LONG TERM GOAL #5   Title Patient (> 60 66ars old) will complete five times sit to stand test in <12.5 seconds indicating an increased LE strength and improved balance.    Baseline 01/25: 31s;  2/9: hands on knees 13.09; 13.16sec hands-free 3/29: 12.03 seconds no UE support.    Time 12    Period Weeks    Status Achieved                 Plan - 12/10/20 1424    Clinical Impression Statement Patient arrived late  to physical therapy session limiting session duration. He requires frequent monitoring of oxygen levels due to low Sp02 with exercise. He remains highly motivated throughout session despite LLE weakness and fatigue.  Pt will continue to benefit from further skilled therapy to improve balance and LE strength to improve ease with ADLs and QOL.    Personal Factors and Comorbidities Age;Comorbidity 2;Past/Current Experience;Time since onset of injury/illness/exacerbation    Comorbidities COPD, History of Cancer, myasthenia gravis, chronic lumbar surgical history.    Examination-Activity Limitations Lift;Squat;Bend;Stairs;Stand;Transfers;Insurance claims handler;Bathing;Hygiene/Grooming;Dressing;Toileting    Examination-Participation Restrictions Yard Work;Church;Cleaning;Driving    Stability/Clinical Decision Making Stable/Uncomplicated    Rehab Potential Good    Clinical Impairments Affecting Rehab Potential PMH    PT Frequency 2x / week    PT Duration 12 weeks    PT Treatment/Interventions ADLs/Self Care Home Management;Electrical Stimulation;Moist Heat;Traction;Ultrasound;Gait training;Stair training;Functional mobility training;Therapeutic activities;Therapeutic exercise;Balance training;Neuromuscular re-education;Patient/family education;Manual techniques;Passive range of motion;Dry needling;Joint Manipulations;Cryotherapy    PT Next Visit Plan balance, strength; progress standing balance, dynamic balance    PT Home Exercise Plan HEP: seated marches, seated heel raises, seated LAQ    Consulted and Agree with Plan of Care Patient           Patient will benefit from skilled therapeutic intervention in order to improve the following deficits and impairments:  Abnormal gait,Decreased balance,Decreased mobility,Decreased endurance,Difficulty walking,Hypomobility,Impaired sensation,Decreased range of motion,Impaired perceived functional ability,Decreased activity tolerance,Decreased coordination,Decreased strength,Impaired flexibility,Postural dysfunction,[name]  Visit Diagnosis: Unsteadiness on feet  Cervical myelopathy (HCC)  Muscle weakness (generalized)     Problem List Patient Active  Problem List   Diagnosis Date Noted  . Carotid stenosis, right 09/17/2020  . S/P cervical spinal fusion   . Leukocytosis   . Essential hypertension   . Anemia of chronic disease   . Postoperative [name]   . Neuropathic [name]   . Cervical myelopathy ([place]) 08/07/2020  . Preop cardiovascular exam 07/17/2020  . SOB (shortness of breath) on exertion 07/17/2020  . Leg weakness, bilateral 07/13/2020  . Aortic atherosclerosis ([place]) 07/09/2020  . Myalgia 10/30/2019  . PAD (peripheral artery disease) ([place]) 06/06/2019  . CKD (chronic kidney disease) stage 3, GFR 30-59 ml/min (HCC) 04/29/2019  . B12 deficiency 01/23/2019  . Left arm numbness 02/28/2018  . Neck [name] 02/28/2018  . Anemia 02/02/2017  . DDD (degenerative disc disease), cervical 02/02/2017  . Hypothyroid 02/02/2017  . MRSA (methicillin resistant staph aureus) culture positive 02/02/2017  . Nocturnal hypoxia 02/02/2017  . Senile purpura ([place]) 10/03/2016  . Essential hypertension, benign 09/16/2016  . Bilateral carotid artery disease ([place]) 09/16/2016  . 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/18/2016  . 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 History of prostate cancer 12/21/2015  . Left carpal tunnel syndrome 11/11/2015  . Kidney stone on left side 07/05/2015  . Myasthenia gravis ([place]) 05/26/2015  . Long-term use of high-risk medication 10/15/2014  . Persistent cough 09/10/2014  . Pure hypercholesterolemia 07/25/2014  . Spinal stenosis, lumbar region, with neurogenic claudication 02/24/2014  . Lumbosacral stenosis with neurogenic claudication ([place]) 02/24/2014  . COPD (chronic obstructive pulmonary disease) ([place]) 01/22/2014   Janna Arch, PT, DPT   12/10/2020, 2:31 PM  [place] MAIN [facility] SERVICES [address] [place], [place], [address] Phone: [phone]   Fax:  [phone]  Name: Francisco [name] MRN: [id number] Date of Birth: 1946/04/24

## 2020-12-15 ENCOUNTER — Other Ambulatory Visit: Payer: Self-pay

## 2020-12-15 ENCOUNTER — Ambulatory Visit: Payer: Medicare Other | Admitting: Occupational Therapy

## 2020-12-15 ENCOUNTER — Encounter: Payer: Self-pay | Admitting: Occupational Therapy

## 2020-12-15 ENCOUNTER — Ambulatory Visit: Payer: Medicare Other | Attending: Internal Medicine

## 2020-12-15 DIAGNOSIS — R278 Other lack of coordination: Secondary | ICD-10-CM | POA: Insufficient documentation

## 2020-12-15 DIAGNOSIS — R262 Difficulty in walking, not elsewhere classified: Secondary | ICD-10-CM | POA: Insufficient documentation

## 2020-12-15 DIAGNOSIS — R2681 Unsteadiness on feet: Secondary | ICD-10-CM | POA: Diagnosis present

## 2020-12-15 DIAGNOSIS — M6281 Muscle weakness (generalized): Secondary | ICD-10-CM

## 2020-12-15 DIAGNOSIS — R2689 Other abnormalities of gait and mobility: Secondary | ICD-10-CM | POA: Insufficient documentation

## 2020-12-15 DIAGNOSIS — G959 Disease of spinal cord, unspecified: Secondary | ICD-10-CM | POA: Diagnosis present

## 2020-12-15 DIAGNOSIS — R269 Unspecified abnormalities of gait and mobility: Secondary | ICD-10-CM | POA: Insufficient documentation

## 2020-12-15 NOTE — Therapy (Signed)
[place] MAIN [facility] SERVICES [address] [place], [place], [address] Phone: [phone]   Fax:  [phone]  Occupational Therapy Treatment  Patient Details  Name: Francisco [name] MRN: [id number] Date of Birth: 03/22/1946 Referring Provider (OT): Dr Annette Stable   Encounter Date: 12/15/2020   OT End of Session - 12/15/20 1655    Visit Number 11    Number of Visits 12    Date for OT Re-Evaluation 12/29/20    OT Start Time 1645    OT Stop Time 1730    OT Time Calculation (min) 45 min    Activity Tolerance Patient tolerated treatment well    Behavior During Therapy [facility] for tasks assessed/performed           Past Medical History:  Diagnosis Date  . Arthritis    lower left hip  . Atypical angina ([place])   . Bilateral hand numbness    from back surgery  . Bronchitis, chronic ([place])   . Cancer [facility])    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; Merkel cell cancer, and Basal cell cancer (twice; 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  . Carotid stenosis   . CKD (chronic kidney disease) stage 3, GFR 30-59 ml/min (HCC)   . 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 DDD (degenerative disc disease), cervical   . Hypercholesterolemia   . Hypertension   . Hypothyroidism    pt takes Levothyroxine daily  . Lumbosacral spinal stenosis   . Myasthenia gravis, adult form ([place])   . PAD (peripheral artery disease) ([place])   . Shortness of breath    Lung MD- Dr Darlin Coco  . Sleep apnea    do not use CPAP every night    Past Surgical History:  Procedure Laterality Date  . ANTERIOR CERVICAL DECOMP/DISCECTOMY FUSION  07/18/2011   Procedure: ANTERIOR CERVICAL DECOMPRESSION/DISCECTOMY FUSION 2 LEVELS;  Surgeon: Cooper Render Pool;  Location: [place] [facility]UR[facility] ORS;  Service: Neurosurgery;  Laterality: N/A;  cervical five-six, cervical six-seven anterior cervical discectomy and fusion  . BACK SURGERY     in [place]     01/2020 Right,  04/2020 Left  . CARDIAC CATHETERIZATION     2005 at [facility], no stents  . CAROTID PTA/STENT INTERVENTION N/A 09/17/2020   Procedure: CAROTID PTA/STENT INTERVENTION;  Surgeon: Algernon Huxley, MD;  Location: [place] CV LAB;  Service: Cardiovascular;  Laterality: N/A;  . CATARACT EXTRACTION W/PHACO Left 01/06/2020   Procedure: CATARACT EXTRACTION PHACO AND INTRAOCULAR LENS PLACEMENT (IOC) ISTENT INJ LEFT 3.81  00:33.3;  Surgeon: Eulogio Bear, MD;  Location: [place];  Service: Ophthalmology;  Laterality: Left;  . CATARACT EXTRACTION W/PHACO Right 02/03/2020   Procedure: CATARACT EXTRACTION PHACO AND INTRAOCULAR LENS PLACEMENT ([place]) RIGHT ISTENT INJ;  Surgeon: Eulogio Bear, MD;  Location: [place];  Service: Ophthalmology;  Laterality: Right;  4.29 0:35.6  . COLONOSCOPY    . HERNIA REPAIR Left    inguinal hernia repair in 1985  . LUMBAR LAMINECTOMY/DECOMPRESSION MICRODISCECTOMY Left 02/24/2014   Procedure: LUMBAR LAMINECTOMY/DECOMPRESSION MICRODISCECTOMY LUMBAR THREE-FOUR, FOUR-FIVE, LEFT FIVE-SACRAL ONE ;  Surgeon: Charlie Pitter, MD;  Location: [place] [facility] ORS;  Service: Neurosurgery;  Laterality: Left;  LUMBAR LAMINECTOMY/DECOMPRESSION MICRODISCECTOMY LUMBAR THREE-FOUR, FOUR-FIVE, LEFT FIVE-SACRAL ONE   . POSTERIOR CERVICAL FUSION/FORAMINOTOMY N/A 08/07/2020   Procedure: C3-6 POSTERIOR FUSION WITH DECOMPRESSION;  Surgeon: Meade Maw, MD;  Location: [facility] ORS;  Service: Neurosurgery;  Laterality: N/A;  .  PROSTATECTOMY  8/14   [facility] Dr Joycelyn Das     There were no vitals filed for this visit.   Subjective Assessment - 12/15/20 1649    Subjective  Pt. plans to drive to [facility] tomorrow.    Pertinent History Pt is a 75 y/o M with hx of cervical myelopathy & admitted on 08/07/20 for scheduled C3-6 Posterior fusion with decompression by Dr. Myer Haff. PMH: atypical angina, PAD, carotid stenosis, HTN, HLD, COPD, Myasthenia gravis, hypothyroidism, CKD-III, OSA  requiring nocturnal PAP therapy, OA, cervical DDD, lumbosacral spinal stenosis, chronic DOE 2/2 COPD.  Pt went to IP rehab and was discharged on 08/20/2020 with orders for OP OT and PT.  History of carpal tunnel bilaterally.    Patient Stated Goals Pt would like to be able to walk again, and be independent as he was before.    Currently in Pain? No/denies           OT TREATMENT    Neuro muscular re-education:  Pt. worked on bilateral [facility] skills using the Copy Task. Pt. worked on sustaining grasp on the resistive tweezers while grasping this sticks, and moving them from a horizontal position to a vertical position to prepare for placing them into the pegboard. Pt. Required verbal cues, and cues for visual demonstration for wrist position, and hand pattern when placing them into the pegboard. Pt. worked on grasping and storing the thin 1" pegs when removing them from the pegboard. Pt. performed [facility] tasks using the Grooved pegboard. Pt. worked on grasping the grooved pegs from a horizontal position, and moving the pegs to a vertical position in the hand to prepare for placing them in the grooved slot. Pt. worked on storing the grooved pegs in hands, and moving the pegs into position in preparation for turning, and placing them into position with his 2nd digit, and thumb.  Therapeutic Exercise:  Pt. performed Bilateral gross gripping with grip strengthener. Pt. worked on sustaining grip while grasping pegs and reaching at various heights. Gripper was set at 23.4# of grip strength force. Pt. Worked on pinch strengthening in the left hand for lateral, and 3pt. pinch using yellow, red, green, and blue resistive clips. Pt. worked on placing the clips at various vertical and horizontal angles. Tactile and verbal cues were required for eliciting the desired movement. Pt. worked on moving the clips from lateral to 3pt. pinch position.  Pt. continues to make progress overall. Pt. was able to  manipulate the tweezers to reposition the pegs from horizontal to a vertical position. Pt. was able to store 17 thin 1" Jamar pegs with his left hand, and 15 with his right hand. Pt. Compensates proximally with hiking his elbow, and pronating his forearm when turning the grooved pegs in his fingers. Pt. continues to work on improving UE strength, and [facility] skills in order to work towards improving, and maximizing independence with ADLs, and IADL tasks.                       OT Education - 12/15/20 1655    Education Details strengthening, HEP    Person(s) Educated Patient    Methods Explanation;Demonstration;Handout;Verbal cues;Tactile cues    Comprehension Verbalized understanding;Returned demonstration;Verbal cues required               OT Long Term Goals - 12/08/20 1310      OT LONG TERM GOAL #1   Title Patient to be independent with home exercise program for  strengthening and coordination skills.    Baseline decreased strength and coordination bilaterally after cervical surgery    Time 6    Period Weeks    Status Achieved      OT LONG TERM GOAL #2   Title Patient will improve grip strength bilaterally by 8# to hold objects securely without dropping.   revise next certification   Baseline Improving grip strength. Refer to [employer] 12/08/20: R grip 65#, L grip 63#    Time 12    Period Weeks    Status Achieved      OT LONG TERM GOAL #3   Title Patient will perform self care tasks with modified independence.    Baseline Independent with morning care    Status Achieved      OT LONG TERM GOAL #4   Title Pt will complete light homemaking tasks with modified independence.    Baseline wife conitnues to assist. 3/39/22: assist for carrying heavy objects over household distance    Time 12    Period Weeks    Status Partially Met      OT LONG TERM GOAL #5   Title Patient will improve bilateral shoulder ROM and strength to place items on shelves at shoulder  height with modified independence.    Baseline Pt. is able to reach items on shelves.    Status Achieved      OT LONG TERM GOAL #6   Title Pt will improve left UE coordination by decreasing time on 9 hole peg test by 10 secs to be able to manipulate and complete buttons with modified independence.    Baseline Pt. continues to have difficulty.refer to flow sheet for measurements. 12/08/20: L - 33 sec, R - 28 sec, occassionally requires assist for small buttons on dress shirt    Time 12    Status Achieved                 Plan - 12/15/20 1656    Clinical Impression Statement Pt. continues to make progress overall. Pt. was able to manipulate the tweezers to reposition the pegs from horizontal to a vertical position. Pt. was able to store 17 thin 1" Jamar pegs with his left hand, and 15 with his right hand. Pt. Compensates proximally with hiking his elbow, and pronating his forearm when turning the grooved pegs in his fingers. Pt. continues to work on improving UE strength, and [facility] skills in order to work towards improving, and maximizing independence with ADLs, and IADL tasks.    OT Occupational Profile and History Detailed Assessment- Review of Records and additional review of physical, cognitive, psychosocial history related to current functional performance    Occupational performance deficits (Please refer to evaluation for details): ADL's;IADL's;Leisure;Work    Marketing executive / Function / Physical Skills ADL;Flexibility;ROM;UE functional use;FMC;Dexterity;Sensation;Strength;IADL;Coordination;Mobility;Decreased knowledge of use of DME;Balance;Endurance    Psychosocial Skills Environmental  Adaptations;Habits;Routines and Behaviors    Rehab Potential Good    Clinical Decision Making Several treatment options, min-mod task modification necessary    Comorbidities Affecting Occupational Performance: Presence of comorbidities impacting occupational performance    Comorbidities impacting  occupational performance description: age, carpal tunnel history, COPD, chronic lumbar surgical history, history of myasthenia gravis, has a full time job and working currently, unable to drive    Modification or Assistance to Complete Evaluation  No modification of tasks or assist necessary to complete eval    OT Frequency 2x / week    OT Duration 6 weeks  OT Treatment/Interventions Self-care/ADL training;Therapeutic exercise;Neuromuscular education;Patient/family education;Therapeutic activities;Cryotherapy;Functional Mobility Training;DME and/or AE instruction;Balance training;Moist Heat    Consulted and Agree with Plan of Care Patient           Patient will benefit from skilled therapeutic intervention in order to improve the following deficits and impairments:   Body Structure / Function / Physical Skills: ADL,Flexibility,ROM,UE functional use,FMC,Dexterity,Sensation,Strength,IADL,Coordination,Mobility,Decreased knowledge of use of DME,Balance,Endurance   Psychosocial Skills: Environmental  Adaptations,Habits,Routines and Behaviors   Visit Diagnosis: Muscle weakness (generalized)  Other lack of coordination    Problem List Patient Active Problem List   Diagnosis Date Noted  . Carotid stenosis, right 09/17/2020  . S/P cervical spinal fusion   . Leukocytosis   . Essential hypertension   . Anemia of chronic disease   . Postoperative pain   . Neuropathic pain   . Cervical myelopathy ([place]) 08/07/2020  . Preop cardiovascular exam 07/17/2020  . SOB (shortness of breath) on exertion 07/17/2020  . Leg weakness, bilateral 07/13/2020  . Aortic atherosclerosis ([place]) 07/09/2020  . Myalgia 10/30/2019  . PAD (peripheral artery disease) ([place]) 06/06/2019  . CKD (chronic kidney disease) stage 3, GFR 30-59 ml/min (HCC) 04/29/2019  . B12 deficiency 01/23/2019  . Left arm numbness 02/28/2018  . Neck pain 02/28/2018  . Anemia 02/02/2017  . DDD (degenerative disc disease), cervical  02/02/2017  . Hypothyroid 02/02/2017  . MRSA (methicillin resistant staph aureus) culture positive 02/02/2017  . Nocturnal hypoxia 02/02/2017  . Senile purpura ([place]) 10/03/2016  . Essential hypertension, benign 09/16/2016  . Bilateral carotid artery disease ([place]) 09/16/2016  . 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/18/2016  . 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 History of prostate cancer 12/21/2015  . Left carpal tunnel syndrome 11/11/2015  . Kidney stone on left side 07/05/2015  . Myasthenia gravis ([place]) 05/26/2015  . Long-term use of high-risk medication 10/15/2014  . Persistent cough 09/10/2014  . Pure hypercholesterolemia 07/25/2014  . Spinal stenosis, lumbar region, with neurogenic claudication 02/24/2014  . Lumbosacral stenosis with neurogenic claudication ([place]) 02/24/2014  . COPD (chronic obstructive pulmonary disease) ([place]) 01/22/2014    Harrel Carina, MS, OTR/L 12/15/2020, 4:59 PM  [place] MAIN [facility] SERVICES [address] [place], [place], [address] Phone: [phone]   Fax:  [phone]  Name: Francisco [name] MRN: [id number] Date of Birth: Feb 23, 1946

## 2020-12-15 NOTE — Therapy (Signed)
[place] [facility] [address] [place], [place], [address] Phone: [phone]   Fax:  [phone]  Physical Therapy Treatment  Patient Details  Name: Francisco [name] MRN: [id number] Date of Birth: 04/10/1946 Referring Provider (PT): Lauraine Rinne, PA-C   Encounter Date: 12/15/2020   PT End of Session - 12/15/20 1620    Visit Number 22    Number of Visits 25    Date for PT Re-Evaluation 12/29/20    Authorization Type [employer] Medicare;  2/10 Pn 12/08/20    Authorization Time Period 08/25/20-11/17/20; 10/06/20-12/29/20    PT Start Time 1601    PT Stop Time 1645    PT Time Calculation (min) 44 min    Equipment Utilized During Treatment Gait belt    Activity Tolerance Patient tolerated treatment well;No increased pain;Patient limited by fatigue    Behavior During Therapy [facility] for tasks assessed/performed           Past Medical History:  Diagnosis Date  . Arthritis    lower left hip  . Atypical angina ([place])   . Bilateral hand numbness    from back surgery  . Bronchitis, chronic ([place])   . Cancer [facility])    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; Merkel cell cancer, and Basal cell cancer (twice; 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  . Carotid stenosis   . CKD (chronic kidney disease) stage 3, GFR 30-59 ml/min (HCC)   . 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 DDD (degenerative disc disease), cervical   . Hypercholesterolemia   . Hypertension   . Hypothyroidism    pt takes Levothyroxine daily  . Lumbosacral spinal stenosis   . Myasthenia gravis, adult form ([place])   . PAD (peripheral artery disease) ([place])   . Shortness of breath    Lung MD- Dr Darlin Coco  . Sleep apnea    do not use CPAP every night    Past Surgical History:  Procedure Laterality Date  . ANTERIOR CERVICAL DECOMP/DISCECTOMY FUSION  07/18/2011   Procedure: ANTERIOR CERVICAL DECOMPRESSION/DISCECTOMY FUSION 2 LEVELS;  Surgeon: Cooper Render Pool;  Location: [place] [facility];   Service: Neurosurgery;  Laterality: N/A;  cervical five-six, cervical six-seven anterior cervical discectomy and fusion  . BACK SURGERY     in [place]     01/2020 Right, 04/2020 Left  . CARDIAC CATHETERIZATION     2005 at [facility], no stents  . CAROTID PTA/STENT INTERVENTION N/A 09/17/2020   Procedure: CAROTID PTA/STENT INTERVENTION;  Surgeon: Algernon Huxley, MD;  Location: [place] CV LAB;  Service: Cardiovascular;  Laterality: N/A;  . CATARACT EXTRACTION W/PHACO Left 01/06/2020   Procedure: CATARACT EXTRACTION PHACO AND INTRAOCULAR LENS PLACEMENT (IOC) ISTENT INJ LEFT 3.81  00:33.3;  Surgeon: Eulogio Bear, MD;  Location: [place];  Service: Ophthalmology;  Laterality: Left;  . CATARACT EXTRACTION W/PHACO Right 02/03/2020   Procedure: CATARACT EXTRACTION PHACO AND INTRAOCULAR LENS PLACEMENT ([place]) RIGHT ISTENT INJ;  Surgeon: Eulogio Bear, MD;  Location: [place];  Service: Ophthalmology;  Laterality: Right;  4.29 0:35.6  . COLONOSCOPY    . HERNIA REPAIR Left    inguinal hernia repair in 1985  . LUMBAR LAMINECTOMY/DECOMPRESSION MICRODISCECTOMY Left 02/24/2014   Procedure: LUMBAR LAMINECTOMY/DECOMPRESSION MICRODISCECTOMY LUMBAR THREE-FOUR, FOUR-FIVE, LEFT FIVE-SACRAL ONE ;  Surgeon: Charlie Pitter, MD;  Location: [place] [facility];  Service: Neurosurgery;  Laterality: Left;  LUMBAR LAMINECTOMY/DECOMPRESSION MICRODISCECTOMY LUMBAR THREE-FOUR, FOUR-FIVE,  LEFT FIVE-SACRAL ONE   . POSTERIOR CERVICAL FUSION/FORAMINOTOMY N/A 08/07/2020   Procedure: C3-6 POSTERIOR FUSION WITH DECOMPRESSION;  Surgeon: Meade Maw, MD;  Location: [facility] ORS;  Service: Neurosurgery;  Laterality: N/A;  . PROSTATECTOMY  8/14   [facility] Dr Mare Ferrari     There were no vitals filed for this visit.   Subjective Assessment - 12/15/20 1606    Subjective Patient reports no falls or LOB since last session. Is tired due to late appointment time.    Pertinent  History Johnwilliam Shepperson is a 42yoM referred to OPPT neuro s/p myelopathy s/p cervical decompression and fusion. Pt sustained several months of general decline in mobility prior to surgical decompression. Pt went to inpatient rehab after hospitalization. [facility].  Pt underwent C3-6 decompression, fusion on 11/26. PMH: myasthenia gravis, 2012 ACDF 5/6, 6/7; OSA, hypothyroidism, HTN, COPD, multiple back surgeries, 2021 carpal tunnel release, referred to OPOT, but only seen for evaluation. 8/10 OPPT evaluation for LSS c radiating pain into the left hip, only completed evaluation session. September - November progressive decline in leg strength and tolerance to upright activity. Also noted a decline in tool manipulation for ADL (pen and meal utensil). Seen by OT and PT as an outpatient for other potential etiology (Carpal tunnel syndrome, and lumbar spinal stenosis), but eventually identified to be related to cervical myelopathy. Several months prior has no limitations in mobility or function. Pt scheduled for Jan 6th: Carotid endarterectomy c Dr. Leotis Pain. Pt has a long history of lumbar DJD, s/p L5/S1 decompression discectomy x2, L5 nerve root injections. Most recent Lumbar MRI (11/10/18) revealin gof advanced formaminal stenosis of L3/4, L4/5, L5/S1. Pt is has had LLE neurological weakness and pain since 2015 and has been followed by neurosurgery.    Limitations Walking;Lifting;Standing;House hold activities    How long can you sit comfortably? Not limited    How long can you stand comfortably? 3-5 minutes    How long can you walk comfortably? 10 minutes    Patient Stated Goals return to functional baseline status of Summer 2021.    Currently in Pain? No/denies                 TREATMENT   Neuro Re-ed: In // bars Modified single limb stance with one LE on yellow dynadisc 30 seconds x2 trials each LE  airex pad: reach and throw ball at hoop x 18 ball no LOB Side-stepping  on airex beam 6x back and forth; CGA-min assist; intermittent UE support  airex balance beam tandem stance SUE support 30 second x 2 trials  airex pad: 3 way hip raise 8x each LE; BUE support airex pad: horizontal head turns 10x each direction airex pad: vertical head turns 10x each direction  Therex: Standing bilateral heel raise 10x  Seated: RTB hamstring curl 10x each LE RTB around bilateral ankles: alternating LAQ keeping feet shoulder width apart.  YTB around ankles: lateral step out 10x each LE  STS squat with arms on bar and chair behind; 10x  Adduction ball squeeze between feet with LAQ 10x Sp02 monitored, dropped to 91 % after exercise    Education provided throughout session in the form of demonstration, VC/TC to facilitate movement at target joints and correct muscle activation with exercises.   Patient is highly motivated throughout physical therapy session. He continues to progress with functional strengthening and stability interventions. Single limb stance continues to be area of improvement. Pt will continue to benefit from further skilled  therapy to improve balance and LE strength to improve ease with ADLs and QOL.                       PT Education - 12/15/20 1619    Education provided Yes    Education Details exercise technique, body mechanics    Person(s) Educated Patient    Methods Explanation;Demonstration;Tactile cues;Verbal cues    Comprehension Verbalized understanding;Returned demonstration;Verbal cues required;Tactile cues required            PT Short Term Goals - 12/08/20 1357      PT SHORT TERM GOAL #1   Title After 6 weeks patient will be independent in HEP to progress strength, AMB, and balance.    Baseline 2/9: HEP compliant 3/29: HEP compliant    Time 6    Period Weeks    Status Achieved    Target Date 11/17/20      PT SHORT TERM GOAL #2   Title After 6 weeks pt will demonstrate improved 5xSTS hands free chair + airex  foam in <20sec    Baseline 01/25: 31s hands free without airex foam 2/9: unable to do on airex 3/29 12.03 seconds no UE support required    Time 6    Period Weeks    Status Achieved    Target Date --             PT Long Term Goals - 12/08/20 1354      PT LONG TERM GOAL #1   Title Patient will reduce timed up and go to <11 seconds to reduce fall risk and demonstrate improved transfer/gait ability.    Baseline 01/25: 30.26s 2/9: 19.55 seconds with RW 3/29: 12.36 seconds with RW    Time 12    Period Weeks    Status Partially Met    Target Date 12/29/20      PT LONG TERM GOAL #2   Title Patient will demonstrate 6MWT (LRAD) >767f to demonstrate improved capacity to access the community.    Baseline 12/16: 120 ft, 01/25: 165 ft with 1 seated rest break for 2 mins 2/9: 395 ft with RW and one seated rest break 3/29: 520 ft with RW and one seated rest break    Time 12    Period Weeks    Status Partially Met    Target Date 12/29/20      PT LONG TERM GOAL #3   Title Patient will increase 10 meter walk test to >1.058m as to improve gait speed for better community ambulation and to reduce fall risk.    Baseline 01/25: 0.3826m2/9: 0.81 m/s with RW 3/29: 0.91 m/s with RW    Time 12    Period Weeks    Status Partially Met    Target Date 12/29/20      PT LONG TERM GOAL #4   Title After 12 weeks pt to demonstrate improved FOTO score >70    Baseline 01/25: 51 2/9: 55.97 % 3/29: 62.9%    Time 12    Period Weeks    Status Partially Met    Target Date 12/29/20      PT LONG TERM GOAL #5   Title Patient (> 60 45ars old) will complete five times sit to stand test in <12.5 seconds indicating an increased LE strength and improved balance.    Baseline 01/25: 31s;  2/9: hands on knees 13.09; 13.16sec hands-free 3/29: 12.03 seconds no UE support.    Time 12  Period Weeks    Status Achieved                 Plan - 12/15/20 1632    Clinical Impression Statement Patient is highly  motivated throughout physical therapy session. He continues to progress with functional strengthening and stability interventions. Single limb stance continues to be area of improvement. Pt will continue to benefit from further skilled therapy to improve balance and LE strength to improve ease with ADLs and QOL.    Personal Factors and Comorbidities Age;Comorbidity 2;Past/Current Experience;Time since onset of injury/illness/exacerbation    Comorbidities COPD, History of Cancer, myasthenia gravis, chronic lumbar surgical history.    Examination-Activity Limitations Lift;Squat;Bend;Stairs;Stand;Transfers;Insurance claims handler;Bathing;Hygiene/Grooming;Dressing;Toileting    Examination-Participation Restrictions Yard Work;Church;Cleaning;Driving    Stability/Clinical Decision Making Stable/Uncomplicated    Rehab Potential Good    Clinical Impairments Affecting Rehab Potential PMH    PT Frequency 2x / week    PT Duration 12 weeks    PT Treatment/Interventions ADLs/Self Care Home Management;Electrical Stimulation;Moist Heat;Traction;Ultrasound;Gait training;Stair training;Functional mobility training;Therapeutic activities;Therapeutic exercise;Balance training;Neuromuscular re-education;Patient/family education;Manual techniques;Passive range of motion;Dry needling;Joint Manipulations;Cryotherapy    PT Next Visit Plan balance, strength; progress standing balance, dynamic balance    PT Home Exercise Plan HEP: seated marches, seated heel raises, seated LAQ    Consulted and Agree with Plan of Care Patient           Patient will benefit from skilled therapeutic intervention in order to improve the following deficits and impairments:  Abnormal gait,Decreased balance,Decreased mobility,Decreased endurance,Difficulty walking,Hypomobility,Impaired sensation,Decreased range of motion,Impaired perceived functional ability,Decreased activity tolerance,Decreased coordination,Decreased  strength,Impaired flexibility,Postural dysfunction,Pain  Visit Diagnosis: Unsteadiness on feet  Cervical myelopathy (HCC)  Muscle weakness (generalized)     Problem List Patient Active Problem List   Diagnosis Date Noted  . Carotid stenosis, right 09/17/2020  . S/P cervical spinal fusion   . Leukocytosis   . Essential hypertension   . Anemia of chronic disease   . Postoperative pain   . Neuropathic pain   . Cervical myelopathy ([place]) 08/07/2020  . Preop cardiovascular exam 07/17/2020  . SOB (shortness of breath) on exertion 07/17/2020  . Leg weakness, bilateral 07/13/2020  . Aortic atherosclerosis ([place]) 07/09/2020  . Myalgia 10/30/2019  . PAD (peripheral artery disease) ([place]) 06/06/2019  . CKD (chronic kidney disease) stage 3, GFR 30-59 ml/min (HCC) 04/29/2019  . B12 deficiency 01/23/2019  . Left arm numbness 02/28/2018  . Neck pain 02/28/2018  . Anemia 02/02/2017  . DDD (degenerative disc disease), cervical 02/02/2017  . Hypothyroid 02/02/2017  . MRSA (methicillin resistant staph aureus) culture positive 02/02/2017  . Nocturnal hypoxia 02/02/2017  . Senile purpura ([place]) 10/03/2016  . Essential hypertension, benign 09/16/2016  . Bilateral carotid artery disease ([place]) 09/16/2016  . 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/18/2016  . 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 History of prostate cancer 12/21/2015  . Left carpal tunnel syndrome 11/11/2015  . Kidney stone on left side 07/05/2015  . Myasthenia gravis ([place]) 05/26/2015  . Long-term use of high-risk medication 10/15/2014  . Persistent cough 09/10/2014  . Pure hypercholesterolemia 07/25/2014  . Spinal stenosis, lumbar region, with neurogenic claudication 02/24/2014  . Lumbosacral stenosis with neurogenic claudication ([place]) 02/24/2014  . COPD (chronic obstructive pulmonary disease) ([place]) 01/22/2014   Janna Arch, PT, DPT   12/15/2020, 4:45 PM  [place] [facility]  SERVICES [address] [place], [place], [address] Phone: [phone]   Fax:  [phone]  Name: Francisco [name] MRN:  [id number] Date of Birth: 12-26-1945

## 2020-12-17 ENCOUNTER — Other Ambulatory Visit: Payer: Self-pay

## 2020-12-17 ENCOUNTER — Ambulatory Visit: Payer: Medicare Other | Admitting: Occupational Therapy

## 2020-12-17 ENCOUNTER — Ambulatory Visit: Payer: Medicare Other

## 2020-12-17 DIAGNOSIS — M6281 Muscle weakness (generalized): Secondary | ICD-10-CM

## 2020-12-17 DIAGNOSIS — R278 Other lack of coordination: Secondary | ICD-10-CM

## 2020-12-17 DIAGNOSIS — R2681 Unsteadiness on feet: Secondary | ICD-10-CM | POA: Diagnosis not present

## 2020-12-17 DIAGNOSIS — G959 Disease of spinal cord, unspecified: Secondary | ICD-10-CM

## 2020-12-17 NOTE — Therapy (Signed)
[place] MAIN [facility] SERVICES [address] [place], [place], [address] Phone: [phone]   Fax:  [phone]  Physical Therapy Treatment  Patient Details  Name: Francisco [name] MRN: [id number] Date of Birth: Jun 26, 1946 Referring Provider (PT): Lauraine Rinne, PA-C   Encounter Date: 12/17/2020   PT End of Session - 12/17/20 1540    Visit Number 23    Number of Visits 25    Date for PT Re-Evaluation 12/29/20    Authorization Type [employer] Medicare;  2/10 Pn 12/08/20    Authorization Time Period 08/25/20-11/17/20; 10/06/20-12/29/20    PT Start Time 1618    PT Stop Time 1659    PT Time Calculation (min) 41 min    Equipment Utilized During Treatment Gait belt    Activity Tolerance Patient tolerated treatment well;No increased pain;Patient limited by fatigue    Behavior During Therapy [facility] for tasks assessed/performed           Past Medical History:  Diagnosis Date  . Arthritis    lower left hip  . Atypical angina ([place])   . Bilateral hand numbness    from back surgery  . Bronchitis, chronic ([place])   . Cancer [facility])    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; Merkel cell cancer, and Basal cell cancer (twice; 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  . Carotid stenosis   . CKD (chronic kidney disease) stage 3, GFR 30-59 ml/min (HCC)   . 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 DDD (degenerative disc disease), cervical   . Hypercholesterolemia   . Hypertension   . Hypothyroidism    pt takes Levothyroxine daily  . Lumbosacral spinal stenosis   . Myasthenia gravis, adult form ([place])   . PAD (peripheral artery disease) ([name])   . Shortness of breath    Lung MD- Dr Darlin Coco  . Sleep apnea    do not use CPAP every night    Past Surgical History:  Procedure Laterality Date  . ANTERIOR CERVICAL DECOMP/DISCECTOMY FUSION  07/18/2011   Procedure: ANTERIOR CERVICAL DECOMPRESSION/DISCECTOMY FUSION 2 LEVELS;  Surgeon: Cooper Render Pool;  Location: [place] NEURO ORS;   Service: Neurosurgery;  Laterality: N/A;  cervical five-six, cervical six-seven anterior cervical discectomy and fusion  . BACK SURGERY     in [place]     01/2020 Right, 04/2020 Left  . CARDIAC CATHETERIZATION     2005 at [facility], no stents  . CAROTID PTA/STENT INTERVENTION N/A 09/17/2020   Procedure: CAROTID PTA/STENT INTERVENTION;  Surgeon: Algernon Huxley, MD;  Location: [place] CV LAB;  Service: Cardiovascular;  Laterality: N/A;  . CATARACT EXTRACTION W/PHACO Left 01/06/2020   Procedure: CATARACT EXTRACTION PHACO AND INTRAOCULAR LENS PLACEMENT (IOC) ISTENT INJ LEFT 3.81  00:33.3;  Surgeon: Eulogio Bear, MD;  Location: [place];  Service: Ophthalmology;  Laterality: Left;  . CATARACT EXTRACTION W/PHACO Right 02/03/2020   Procedure: CATARACT EXTRACTION PHACO AND INTRAOCULAR LENS PLACEMENT ([place]) RIGHT ISTENT INJ;  Surgeon: Eulogio Bear, MD;  Location: [place];  Service: Ophthalmology;  Laterality: Right;  4.29 0:35.6  . COLONOSCOPY    . HERNIA REPAIR Left    inguinal hernia repair in 1985  . LUMBAR LAMINECTOMY/DECOMPRESSION MICRODISCECTOMY Left 02/24/2014   Procedure: LUMBAR LAMINECTOMY/DECOMPRESSION MICRODISCECTOMY LUMBAR THREE-FOUR, FOUR-FIVE, LEFT FIVE-SACRAL ONE ;  Surgeon: Charlie Pitter, MD;  Location: [place] NEURO ORS;  Service: Neurosurgery;  Laterality: Left;  LUMBAR LAMINECTOMY/DECOMPRESSION MICRODISCECTOMY LUMBAR THREE-FOUR, FOUR-FIVE,  LEFT FIVE-SACRAL ONE   . POSTERIOR CERVICAL FUSION/FORAMINOTOMY N/A 08/07/2020   Procedure: C3-6 POSTERIOR FUSION WITH DECOMPRESSION;  Surgeon: Meade Maw, MD;  Location: [facility] ORS;  Service: Neurosurgery;  Laterality: N/A;  . PROSTATECTOMY  8/14   [facility] Dr Mare Ferrari     There were no vitals filed for this visit.   Subjective Assessment - 12/17/20 1530    Subjective Patient reports a new onset of bilateral rib pain that began between now and last session. No falls or  LOB since last session. Will miss OT appointment due to work.    Pertinent History Francisco [name] is a 18yoM referred to OPPT neuro s/p myelopathy s/p cervical decompression and fusion. Pt sustained several months of general decline in mobility prior to surgical decompression. Pt went to inpatient rehab after hospitalization. [facility].  Pt underwent C3-6 decompression, fusion on 11/26. PMH: myasthenia gravis, 2012 ACDF 5/6, 6/7; OSA, hypothyroidism, HTN, COPD, multiple back surgeries, 2021 carpal tunnel release, referred to OPOT, but only seen for evaluation. 8/10 OPPT evaluation for LSS c radiating pain into the left hip, only completed evaluation session. September - November progressive decline in leg strength and tolerance to upright activity. Also noted a decline in tool manipulation for ADL (pen and meal utensil). Seen by OT and PT as an outpatient for other potential etiology (Carpal tunnel syndrome, and lumbar spinal stenosis), but eventually identified to be related to cervical myelopathy. Several months prior has no limitations in mobility or function. Pt scheduled for Jan 6th: Carotid endarterectomy c Dr. Leotis Pain. Pt has a long history of lumbar DJD, s/p L5/S1 decompression discectomy x2, L5 nerve root injections. Most recent Lumbar MRI (11/10/18) revealin gof advanced formaminal stenosis of L3/4, L4/5, L5/S1. Pt is has had LLE neurological weakness and pain since 2015 and has been followed by neurosurgery.    Limitations Walking;Lifting;Standing;House hold activities    How long can you sit comfortably? Not limited    How long can you stand comfortably? 3-5 minutes    How long can you walk comfortably? 10 minutes    Patient Stated Goals return to functional baseline status of Summer 2021.    Currently in Pain? No/denies              TREATMENT   Neuro Re-ed: In // bars  airex pad orange hurdle step over and back 10x each LE; BUE support airex pad orange hurdle  lateral step over and back 10x each LE; BUE support airex pad: tap 3 cones based on PT guidance, BUE support 8x each LE; very challenging LLE cross body tap Balloon taps reaching inside/outside BOS x3 minutes airex pad: reach and throw ball at hoop x 18 ball no LOB  toy soldiers 2x length of // bars; BUE support  Therex:  Lateral stepping 4x length of // bars Modified walking lunges with decreasing UE support 4x length of // bars  Seated: RTB around bilateral ankles: alternating LAQ keeping feet shoulder width apart.  Adduction ball squeeze between feet with LAQ 10x Bilateral df 10x each LE, very challenging LLE     Education provided throughout session in the form of demonstration, VC/TC to facilitate movement at target joints and correct muscle activation with exercises.                                PT Education - 12/17/20 1536    Education provided Yes  Education Details exercise technique, body mechanics    Person(s) Educated Patient    Methods Explanation;Demonstration;Tactile cues;Verbal cues    Comprehension Verbalized understanding;Returned demonstration;Verbal cues required;Tactile cues required            PT Short Term Goals - 12/08/20 1357      PT SHORT TERM GOAL #1   Title After 6 weeks patient will be independent in HEP to progress strength, AMB, and balance.    Baseline 2/9: HEP compliant 3/29: HEP compliant    Time 6    Period Weeks    Status Achieved    Target Date 11/17/20      PT SHORT TERM GOAL #2   Title After 6 weeks pt will demonstrate improved 5xSTS hands free chair + airex foam in <20sec    Baseline 01/25: 31s hands free without airex foam 2/9: unable to do on airex 3/29 12.03 seconds no UE support required    Time 6    Period Weeks    Status Achieved    Target Date --             PT Long Term Goals - 12/08/20 1354      PT LONG TERM GOAL #1   Title Patient will reduce timed up and go to <11 seconds to reduce  fall risk and demonstrate improved transfer/gait ability.    Baseline 01/25: 30.26s 2/9: 19.55 seconds with RW 3/29: 12.36 seconds with RW    Time 12    Period Weeks    Status Partially Met    Target Date 12/29/20      PT LONG TERM GOAL #2   Title Patient will demonstrate 6MWT (LRAD) >760f to demonstrate improved capacity to access the community.    Baseline 12/16: 120 ft, 01/25: 165 ft with 1 seated rest break for 2 mins 2/9: 395 ft with RW and one seated rest break 3/29: 520 ft with RW and one seated rest break    Time 12    Period Weeks    Status Partially Met    Target Date 12/29/20      PT LONG TERM GOAL #3   Title Patient will increase 10 meter walk test to >1.063m as to improve gait speed for better community ambulation and to reduce fall risk.    Baseline 01/25: 0.3864m2/9: 0.81 m/s with RW 3/29: 0.91 m/s with RW    Time 12    Period Weeks    Status Partially Met    Target Date 12/29/20      PT LONG TERM GOAL #4   Title After 12 weeks pt to demonstrate improved FOTO score >70    Baseline 01/25: 51 2/9: 55.97 % 3/29: 62.9%    Time 12    Period Weeks    Status Partially Met    Target Date 12/29/20      PT LONG TERM GOAL #5   Title Patient (> 60 19ars old) will complete five times sit to stand test in <12.5 seconds indicating an increased LE strength and improved balance.    Baseline 01/25: 31s;  2/9: hands on knees 13.09; 13.16sec hands-free 3/29: 12.03 seconds no UE support.    Time 12    Period Weeks    Status Achieved                 Plan - 12/17/20 1552    Clinical Impression Statement Patient experiences intermittent pain throughout session in ribs however did not have pain at  start of session. Patient tolerated standing and seated interventions well and reports fatigue by end of session. He remains highly motivated for progression of care. . Pt will continue to benefit from further skilled therapy to improve balance and LE strength to improve ease with  ADLs and QOL.    Personal Factors and Comorbidities Age;Comorbidity 2;Past/Current Experience;Time since onset of injury/illness/exacerbation    Comorbidities COPD, History of Cancer, myasthenia gravis, chronic lumbar surgical history.    Examination-Activity Limitations Lift;Squat;Bend;Stairs;Stand;Transfers;Insurance claims handler;Bathing;Hygiene/Grooming;Dressing;Toileting    Examination-Participation Restrictions Yard Work;Church;Cleaning;Driving    Stability/Clinical Decision Making Stable/Uncomplicated    Rehab Potential Good    Clinical Impairments Affecting Rehab Potential PMH    PT Frequency 2x / week    PT Duration 12 weeks    PT Treatment/Interventions ADLs/Self Care Home Management;Electrical Stimulation;Moist Heat;Traction;Ultrasound;Gait training;Stair training;Functional mobility training;Therapeutic activities;Therapeutic exercise;Balance training;Neuromuscular re-education;Patient/family education;Manual techniques;Passive range of motion;Dry needling;Joint Manipulations;Cryotherapy    PT Next Visit Plan balance, strength; progress standing balance, dynamic balance    PT Home Exercise Plan HEP: seated marches, seated heel raises, seated LAQ    Consulted and Agree with Plan of Care Patient           Patient will benefit from skilled therapeutic intervention in order to improve the following deficits and impairments:  Abnormal gait,Decreased balance,Decreased mobility,Decreased endurance,Difficulty walking,Hypomobility,Impaired sensation,Decreased range of motion,Impaired perceived functional ability,Decreased activity tolerance,Decreased coordination,Decreased strength,Impaired flexibility,Postural dysfunction,Pain  Visit Diagnosis: Muscle weakness (generalized)  Other lack of coordination  Unsteadiness on feet  Cervical myelopathy [facility])     Problem List Patient Active Problem List   Diagnosis Date Noted  . Carotid stenosis, right 09/17/2020  . S/P  cervical spinal fusion   . Leukocytosis   . Essential hypertension   . Anemia of chronic disease   . Postoperative pain   . Neuropathic pain   . Cervical myelopathy ([place]) 08/07/2020  . Preop cardiovascular exam 07/17/2020  . SOB (shortness of breath) on exertion 07/17/2020  . Leg weakness, bilateral 07/13/2020  . Aortic atherosclerosis ([place]) 07/09/2020  . Myalgia 10/30/2019  . PAD (peripheral artery disease) ([place]) 06/06/2019  . CKD (chronic kidney disease) stage 3, GFR 30-59 ml/min (HCC) 04/29/2019  . B12 deficiency 01/23/2019  . Left arm numbness 02/28/2018  . Neck pain 02/28/2018  . Anemia 02/02/2017  . DDD (degenerative disc disease), cervical 02/02/2017  . Hypothyroid 02/02/2017  . MRSA (methicillin resistant staph aureus) culture positive 02/02/2017  . Nocturnal hypoxia 02/02/2017  . Senile purpura ([place]) 10/03/2016  . Essential hypertension, benign 09/16/2016  . Bilateral carotid artery disease ([place]) 09/16/2016  . 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/18/2016  . 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 History of prostate cancer 12/21/2015  . Left carpal tunnel syndrome 11/11/2015  . Kidney stone on left side 07/05/2015  . Myasthenia gravis ([place]) 05/26/2015  . Long-term use of high-risk medication 10/15/2014  . Persistent cough 09/10/2014  . Pure hypercholesterolemia 07/25/2014  . Spinal stenosis, lumbar region, with neurogenic claudication 02/24/2014  . Lumbosacral stenosis with neurogenic claudication ([place]) 02/24/2014  . COPD (chronic obstructive pulmonary disease) ([place]) 01/22/2014   Janna Arch, PT, DPT   12/17/2020, 3:59 PM  [place] MAIN [facility] SERVICES [address] [place], [place], [address] Phone: [phone]   Fax:  [phone]  Name: Francisco [name] MRN: [id number] Date of Birth: 1946-01-30

## 2020-12-22 ENCOUNTER — Ambulatory Visit: Payer: Medicare Other | Admitting: Occupational Therapy

## 2020-12-22 ENCOUNTER — Other Ambulatory Visit: Payer: Self-pay

## 2020-12-22 ENCOUNTER — Encounter: Payer: Self-pay | Admitting: Occupational Therapy

## 2020-12-22 DIAGNOSIS — M6281 Muscle weakness (generalized): Secondary | ICD-10-CM

## 2020-12-22 DIAGNOSIS — R2681 Unsteadiness on feet: Secondary | ICD-10-CM | POA: Diagnosis not present

## 2020-12-22 NOTE — Therapy (Signed)
[place] MAIN [facility] SERVICES [address] [place], [place], [address] Phone: [phone]   Fax:  [phone]  Occupational Therapy Treatment/Discharge Note  Patient Details  Name: Francisco [name] MRN: [id number] Date of Birth: 18-Oct-1945 Referring Provider (OT): Dr Annette Stable   Encounter Date: 12/22/2020   OT End of Session - 12/22/20 1737    Visit Number 12    Number of Visits 12    Date for OT Re-Evaluation 12/29/20    OT Start Time 1642    OT Stop Time 1722    OT Time Calculation (min) 40 min    Activity Tolerance Patient tolerated treatment well    Behavior During Therapy [facility] for tasks assessed/performed           Past Medical History:  Diagnosis Date  . Arthritis    lower left hip  . Atypical angina ([place])   . Bilateral hand numbness    from back surgery  . Bronchitis, chronic ([place])   . Cancer [facility])    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; Merkel cell cancer, and Basal cell cancer (twice; 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  . Carotid stenosis   . CKD (chronic kidney disease) stage 3, GFR 30-59 ml/min (HCC)   . 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 DDD (degenerative disc disease), cervical   . Hypercholesterolemia   . Hypertension   . Hypothyroidism    pt takes Levothyroxine daily  . Lumbosacral spinal stenosis   . Myasthenia gravis, adult form ([place])   . PAD (peripheral artery disease) ([place])   . Shortness of breath    Lung MD- Dr Darlin Coco  . Sleep apnea    do not use CPAP every night    Past Surgical History:  Procedure Laterality Date  . ANTERIOR CERVICAL DECOMP/DISCECTOMY FUSION  07/18/2011   Procedure: ANTERIOR CERVICAL DECOMPRESSION/DISCECTOMY FUSION 2 LEVELS;  Surgeon: Cooper Render Pool;  Location: [place] NEURO ORS;  Service: Neurosurgery;  Laterality: N/A;  cervical five-six, cervical six-seven anterior cervical discectomy and fusion  . BACK SURGERY     in [place]      01/2020 Right, 04/2020 Left  . CARDIAC CATHETERIZATION     2005 at [facility], no stents  . CAROTID PTA/STENT INTERVENTION N/A 09/17/2020   Procedure: CAROTID PTA/STENT INTERVENTION;  Surgeon: Algernon Huxley, MD;  Location: [place] CV LAB;  Service: Cardiovascular;  Laterality: N/A;  . CATARACT EXTRACTION W/PHACO Left 01/06/2020   Procedure: CATARACT EXTRACTION PHACO AND INTRAOCULAR LENS PLACEMENT (IOC) ISTENT INJ LEFT 3.81  00:33.3;  Surgeon: Eulogio Bear, MD;  Location: [place];  Service: Ophthalmology;  Laterality: Left;  . CATARACT EXTRACTION W/PHACO Right 02/03/2020   Procedure: CATARACT EXTRACTION PHACO AND INTRAOCULAR LENS PLACEMENT ([place]) RIGHT ISTENT INJ;  Surgeon: Eulogio Bear, MD;  Location: [place];  Service: Ophthalmology;  Laterality: Right;  4.29 0:35.6  . COLONOSCOPY    . HERNIA REPAIR Left    inguinal hernia repair in 1985  . LUMBAR LAMINECTOMY/DECOMPRESSION MICRODISCECTOMY Left 02/24/2014   Procedure: LUMBAR LAMINECTOMY/DECOMPRESSION MICRODISCECTOMY LUMBAR THREE-FOUR, FOUR-FIVE, LEFT FIVE-SACRAL ONE ;  Surgeon: Charlie Pitter, MD;  Location: [place] NEURO ORS;  Service: Neurosurgery;  Laterality: Left;  LUMBAR LAMINECTOMY/DECOMPRESSION MICRODISCECTOMY LUMBAR THREE-FOUR, FOUR-FIVE, LEFT FIVE-SACRAL ONE   . POSTERIOR CERVICAL FUSION/FORAMINOTOMY N/A 08/07/2020   Procedure: C3-6 POSTERIOR FUSION WITH DECOMPRESSION;  Surgeon: Meade Maw, MD;  Location: [facility] ORS;  Service: Neurosurgery;  Laterality:  N/A;  . PROSTATECTOMY  8/14   [facility] Dr Mare Ferrari     There were no vitals filed for this visit.   Subjective Assessment - 12/22/20 1736    Subjective  Pt. reports that he is feeling good today.    Pertinent History Pt is a 75 y/o M with hx of cervical myelopathy & admitted on 08/07/20 for scheduled C3-6 Posterior fusion with decompression by Dr. Izora Ribas. PMH: atypical angina, PAD, carotid stenosis, HTN, HLD, COPD, Myasthenia gravis, hypothyroidism,  CKD-III, OSA requiring nocturnal PAP therapy, OA, cervical DDD, lumbosacral spinal stenosis, chronic DOE 2/2 COPD.  Pt went to IP rehab and was discharged on 08/20/2020 with orders for OP OT and PT.  History of carpal tunnel bilaterally.    Currently in Pain? No/denies              [facility] OT Assessment - 12/22/20 0001      Coordination   Right 9 Hole Peg Test 23    Left 9 Hole Peg Test 27      Hand Function   Right Hand Grip (lbs) 65    Right Hand Lateral Pinch 17 lbs    Right Hand 3 Point Pinch 16 lbs    Left Hand Grip (lbs) 65    Left Hand Lateral Pinch 17 lbs    Left 3 point pinch 14 lbs          Pt. performed gross gripping with grip strengthener. Pt. worked on sustaining grip while grasping pegs and reaching at various heights in standing. Gripper was placed in the 5th resistive slot.   Measurements were obtained, and goals were reviewed with the pt. Pt. has made progress and is now able touse his UEs to complete daily self-care tasks, morning care, and is assisting more with home management tasks. Pt. has resumed driving. Pt. has progressed with grip strength, pinch strength, and has made excellent gains with [facility] skills. Pt. has met the OT goals, and is now appropriate for discharge.                  OT Education - 12/22/20 1737    Person(s) Educated Patient    Methods Explanation;Demonstration;Handout;Verbal cues;Tactile cues    Comprehension Verbalized understanding;Returned demonstration;Verbal cues required               OT Long Term Goals - 12/22/20 1744      OT LONG TERM GOAL #1   Title Patient to be independent with home exercise program for strengthening and coordination skills.    Baseline decreased strength and coordination bilaterally after cervical surgery    Time 6    Period Weeks    Status Achieved      OT LONG TERM GOAL #2   Title Patient will improve grip strength bilaterally by 8# to hold objects securely without dropping.     Baseline Improving grip strength. Refer to [employer] 12/08/20: R grip 65#, L grip 63#    Time 12    Period Weeks    Status Achieved      OT LONG TERM GOAL #3   Title Patient will perform self care tasks with modified independence.    Baseline Independent with morning care    Time 3    Period Weeks    Status Achieved      OT LONG TERM GOAL #4   Title Pt will complete light homemaking tasks with modified independence.    Baseline Pt. is now able to vacuum,  chage a light bulb, and assist his wife with houshold tasks.    Time 12    Status Achieved      OT LONG TERM GOAL #5   Title Patient will improve bilateral shoulder ROM and strength to place items on shelves at shoulder height with modified independence.    Baseline Pt. is able to reach items on shelves.    Time 6    Period Weeks    Status Achieved      OT LONG TERM GOAL #6   Title Pt will improve left UE coordination by decreasing time on 9 hole peg test by 10 secs to be able to manipulate and complete buttons with modified independence.    Baseline Pt. continues to have difficulty.refer to flow sheet for measurements. 12/08/20: L - 33 sec, R - 28 sec, occassionally requires assist for small buttons on dress shirt    Time 12    Period Weeks    Status Achieved                 Plan - 12/22/20 1738    Clinical Impression Statement Measurements were obtained, and goals were reviewed with the pt. Pt. has made progress and is now able touse his UEs to complete daily self-care tasks, morning care, and is assisting more with home management tasks. Pt. has resumed driving. Pt. has progressed with grip strength, pinch strength, and has made excellent gains with [facility] skills.Pt. has met OT goals, and is now appropriate for discharge.    OT Occupational Profile and History Detailed Assessment- Review of Records and additional review of physical, cognitive, psychosocial history related to current functional performance     Occupational performance deficits (Please refer to evaluation for details): ADL's;IADL's;Leisure;Work    Marketing executive / Function / Physical Skills ADL;Flexibility;ROM;UE functional use;FMC;Dexterity;Sensation;Strength;IADL;Coordination;Mobility;Decreased knowledge of use of DME;Balance;Endurance    Psychosocial Skills Environmental  Adaptations;Habits;Routines and Behaviors    Rehab Potential Good    Clinical Decision Making Several treatment options, min-mod task modification necessary    Comorbidities Affecting Occupational Performance: Presence of comorbidities impacting occupational performance    Comorbidities impacting occupational performance description: age, carpal tunnel history, COPD, chronic lumbar surgical history, history of myasthenia gravis, has a full time job and working currently, unable to drive    Modification or Assistance to Complete Evaluation  No modification of tasks or assist necessary to complete eval    OT Frequency 2x / week    OT Duration 6 weeks    OT Treatment/Interventions Self-care/ADL training;Therapeutic exercise;Neuromuscular education;Patient/family education;Therapeutic activities;Cryotherapy;Functional Mobility Training;DME and/or AE instruction;Balance training;Moist Heat    Consulted and Agree with Plan of Care Patient           Patient will benefit from skilled therapeutic intervention in order to improve the following deficits and impairments:   Body Structure / Function / Physical Skills: ADL,Flexibility,ROM,UE functional use,FMC,Dexterity,Sensation,Strength,IADL,Coordination,Mobility,Decreased knowledge of use of DME,Balance,Endurance   Psychosocial Skills: Environmental  Adaptations,Habits,Routines and Behaviors   Visit Diagnosis: Muscle weakness (generalized)    Problem List Patient Active Problem List   Diagnosis Date Noted  . Carotid stenosis, right 09/17/2020  . S/P cervical spinal fusion   . Leukocytosis   . Essential  hypertension   . Anemia of chronic disease   . Postoperative pain   . Neuropathic pain   . Cervical myelopathy ([place]) 08/07/2020  . Preop cardiovascular exam 07/17/2020  . SOB (shortness of breath) on exertion 07/17/2020  . Leg weakness, bilateral 07/13/2020  . Aortic atherosclerosis (  [place]) 07/09/2020  . Myalgia 10/30/2019  . PAD (peripheral artery disease) ([place]) 06/06/2019  . CKD (chronic kidney disease) stage 3, GFR 30-59 ml/min (HCC) 04/29/2019  . B12 deficiency 01/23/2019  . Left arm numbness 02/28/2018  . Neck pain 02/28/2018  . Anemia 02/02/2017  . DDD (degenerative disc disease), cervical 02/02/2017  . Hypothyroid 02/02/2017  . MRSA (methicillin resistant staph aureus) culture positive 02/02/2017  . Nocturnal hypoxia 02/02/2017  . Senile purpura ([place]) 10/03/2016  . Essential hypertension, benign 09/16/2016  . Bilateral carotid artery disease ([place]) 09/16/2016  . 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/18/2016  . 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 History of prostate cancer 12/21/2015  . Left carpal tunnel syndrome 11/11/2015  . Kidney stone on left side 07/05/2015  . Myasthenia gravis ([place]) 05/26/2015  . Long-term use of high-risk medication 10/15/2014  . Persistent cough 09/10/2014  . Pure hypercholesterolemia 07/25/2014  . Spinal stenosis, lumbar region, with neurogenic claudication 02/24/2014  . Lumbosacral stenosis with neurogenic claudication ([place]) 02/24/2014  . COPD (chronic obstructive pulmonary disease) ([place]) 01/22/2014    Harrel Carina, MS, OTR/L 12/22/2020, 5:48 PM  [place] MAIN [facility] SERVICES [address] [place], [place], [address] Phone: [phone]   Fax:  [phone]  Name: Francisco [name] MRN: [id number] Date of Birth: [birth year]

## 2020-12-24 ENCOUNTER — Other Ambulatory Visit: Payer: Self-pay

## 2020-12-24 ENCOUNTER — Ambulatory Visit: Payer: Medicare Other

## 2020-12-24 ENCOUNTER — Ambulatory Visit: Payer: Medicare Other | Admitting: Occupational Therapy

## 2020-12-24 DIAGNOSIS — M6281 Muscle weakness (generalized): Secondary | ICD-10-CM

## 2020-12-24 DIAGNOSIS — R2681 Unsteadiness on feet: Secondary | ICD-10-CM | POA: Diagnosis not present

## 2020-12-24 DIAGNOSIS — R269 Unspecified abnormalities of gait and mobility: Secondary | ICD-10-CM

## 2020-12-24 DIAGNOSIS — R2689 Other abnormalities of gait and mobility: Secondary | ICD-10-CM

## 2020-12-24 DIAGNOSIS — R262 Difficulty in walking, not elsewhere classified: Secondary | ICD-10-CM

## 2020-12-24 NOTE — Therapy (Signed)
[place] MAIN [facility] SERVICES [address] [place], [place], [address] Phone: [phone]   Fax:  [phone]  Physical Therapy Treatment  Patient Details  Name: Francisco [name] MRN: [id number] Date of Birth: 1946/02/20 Referring Provider (PT): Francisco Rinne, PA-C   Encounter Date: 12/24/2020   PT End of Session - 12/24/20 1527    Visit Number 24    Number of Visits 25    Date for PT Re-Evaluation 12/29/20    Authorization Type [employer] Medicare;  2/10 Pn 12/08/20    Authorization Time Period 08/25/20-11/17/20; 10/06/20-12/29/20    PT Start Time 1521    PT Stop Time 1559    PT Time Calculation (min) 38 min    Equipment Utilized During Treatment Gait belt    Activity Tolerance Patient tolerated treatment well;No increased [name];Patient limited by fatigue    Behavior During Therapy [facility] for tasks assessed/performed           Past Medical History:  Diagnosis Date  . Arthritis    lower left hip  . Atypical angina ([place])   . Bilateral hand numbness    from back surgery  . Bronchitis, chronic ([place])   . Cancer [facility])    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; Merkel cell cancer, and Basal cell cancer (twice; 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  . Carotid stenosis   . CKD (chronic kidney disease) stage 3, GFR 30-59 ml/min (HCC)   . 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 DDD (degenerative disc disease), cervical   . Hypercholesterolemia   . Hypertension   . Hypothyroidism    pt takes Levothyroxine daily  . Lumbosacral spinal stenosis   . Myasthenia gravis, adult form ([place])   . PAD (peripheral artery disease) ([place])   . Shortness of breath    Lung MD- Dr Francisco [name]  . Sleep apnea    do not use CPAP every night    Past Surgical History:  Procedure Laterality Date  . ANTERIOR CERVICAL DECOMP/DISCECTOMY FUSION  07/18/2011   Procedure: ANTERIOR CERVICAL DECOMPRESSION/DISCECTOMY FUSION 2 LEVELS;  Surgeon: Francisco [name];  Location: [place] [facility]UR[facility] OR[facility];   Service: Neurosurgery;  Laterality: N/A;  cervical five-six, cervical six-seven anterior cervical discectomy and fusion  . BACK SURGERY     in [place]     01/2020 Right, 04/2020 Left  . CARDIAC CATHETERIZATION     2005 at [facility], no stents  . CAROTID PTA/STENT INTERVENTION N/A 09/17/2020   Procedure: CAROTID PTA/STENT INTERVENTION;  Surgeon: Francisco Huxley, MD;  Location: [place] CV LAB;  Service: Cardiovascular;  Laterality: N/A;  . CATARACT EXTRACTION W/PHACO Left 01/06/2020   Procedure: CATARACT EXTRACTION PHACO AND INTRAOCULAR LENS PLACEMENT (IOC) ISTENT INJ LEFT 3.81  00:33.3;  Surgeon: Francisco Bear, MD;  Location: [place];  Service: Ophthalmology;  Laterality: Left;  . CATARACT EXTRACTION W/PHACO Right 02/03/2020   Procedure: CATARACT EXTRACTION PHACO AND INTRAOCULAR LENS PLACEMENT ([place]) RIGHT ISTENT INJ;  Surgeon: Francisco Bear, MD;  Location: [place];  Service: Ophthalmology;  Laterality: Right;  4.29 0:35.6  . COLONOSCOPY    . HERNIA REPAIR Left    inguinal hernia repair in 1985  . LUMBAR LAMINECTOMY/DECOMPRESSION MICRODISCECTOMY Left 02/24/2014   Procedure: LUMBAR LAMINECTOMY/DECOMPRESSION MICRODISCECTOMY LUMBAR THREE-FOUR, FOUR-FIVE, LEFT FIVE-SACRAL ONE ;  Surgeon: Francisco Pitter, MD;  Location: [place] NEURO ORS;  Service: Neurosurgery;  Laterality: Left;  LUMBAR LAMINECTOMY/DECOMPRESSION MICRODISCECTOMY LUMBAR THREE-FOUR, FOUR-FIVE,  LEFT FIVE-SACRAL ONE   . POSTERIOR CERVICAL FUSION/FORAMINOTOMY N/A 08/07/2020   Procedure: C3-6 POSTERIOR FUSION WITH DECOMPRESSION;  Surgeon: Francisco Maw, MD;  Location: [facility] ORS;  Service: Neurosurgery;  Laterality: N/A;  . PROSTATECTOMY  8/14   [facility] Dr Francisco [name]     There were no vitals filed for this visit.   Subjective Assessment - 12/24/20 1524    Subjective Patient reports no further rib [name] symptoms and reports only fatigued from being out today at a meeting  in [facility].    Pertinent History Francisco [name] is a 5yoM referred to OPPT neuro s/p myelopathy s/p cervical decompression and fusion. Pt sustained several months of general decline in mobility prior to surgical decompression. Pt went to inpatient rehab after hospitalization. [facility].  Pt underwent C3-6 decompression, fusion on 11/26. PMH: myasthenia gravis, 2012 ACDF 5/6, 6/7; OSA, hypothyroidism, HTN, COPD, multiple back surgeries, 2021 carpal tunnel release, referred to OPOT, but only seen for evaluation. 8/10 OPPT evaluation for LSS c radiating [name] into the left hip, only completed evaluation session. September - November progressive decline in leg strength and tolerance to upright activity. Also noted a decline in tool manipulation for ADL (pen and meal utensil). Seen by OT and PT as an outpatient for other potential etiology (Carpal tunnel syndrome, and lumbar spinal stenosis), but eventually identified to be related to cervical myelopathy. Several months prior has no limitations in mobility or function. Pt scheduled for Jan 6th: Carotid endarterectomy c Dr. Leotis [name]. Pt has a long history of lumbar DJD, s/p L5/S1 decompression discectomy x2, L5 nerve root injections. Most recent Lumbar MRI (11/10/18) revealin gof advanced formaminal stenosis of L3/4, L4/5, L5/S1. Pt is has had LLE neurological weakness and [name] since 2015 and has been followed by neurosurgery.    Limitations Walking;Lifting;Standing;House hold activities    How long can you sit comfortably? Not limited    How long can you stand comfortably? 3-5 minutes    How long can you walk comfortably? 10 minutes    Patient Stated Goals return to functional baseline status of Summer 2021.    Currently in [name]? No/denies           TREATMENT   Neuro Re-ed: In // bars Forward/backward step over 1/2 foam x 15 reps each LE without UE support . Challenging yet patient improved with practice and able to mostly  maintain no UE support.   Side step over 1/2 white foam roll without UE support x 15 reps. Patient reported as fatigued yet no loss of balance.    Static stand on 1/2 foam roll (curved side on top) without UE support x 5-10 sec. Patient presents with increased A/P sway and difficulty maintaining balance without intermittent reaching for bar.   Tandem stance on blue airex pad - no UE support x 10-15 sec hold B LE. Patient with initial difficulty without reaching but did improve overall with practice.   Education provided throughout session via VC/TC and demonstration to facilitate movement at target joints and correct muscle activation for all testing and exercises performed.  Clinical Impression: Patient able to progress with balance today as observed with decreased overall UE support while progressing through balance activities. Patient was more limited by fatigue requiring brief rest breaks but overall improving with more advanced activities. Pt will continue to benefit from further skilled therapy to improve balance and LE strength to improve ease with ADLs and QOL.  PT Education - 12/25/20 1444    Education provided Yes    Education Details specific exercise form    Person(s) Educated Patient    Methods Explanation;Demonstration;Tactile cues;Verbal cues    Comprehension Verbalized understanding;Returned demonstration;Verbal cues required;Tactile cues required;Need further instruction            PT Short Term Goals - 12/08/20 1357      PT SHORT TERM GOAL #1   Title After 6 weeks patient will be independent in HEP to progress strength, AMB, and balance.    Baseline 2/9: HEP compliant 3/29: HEP compliant    Time 6    Period Weeks    Status Achieved    Target Date 11/17/20      PT SHORT TERM GOAL #2   Title After 6 weeks pt will demonstrate improved 5xSTS hands free chair + airex foam in <20sec    Baseline 01/25: 31s hands free without  airex foam 2/9: unable to do on airex 3/29 12.03 seconds no UE support required    Time 6    Period Weeks    Status Achieved    Target Date --             PT Long Term Goals - 12/08/20 1354      PT LONG TERM GOAL #1   Title Patient will reduce timed up and go to <11 seconds to reduce fall risk and demonstrate improved transfer/gait ability.    Baseline 01/25: 30.26s 2/9: 19.55 seconds with RW 3/29: 12.36 seconds with RW    Time 12    Period Weeks    Status Partially Met    Target Date 12/29/20      PT LONG TERM GOAL #2   Title Patient will demonstrate 6MWT (LRAD) >72f to demonstrate improved capacity to access the community.    Baseline 12/16: 120 ft, 01/25: 165 ft with 1 seated rest break for 2 mins 2/9: 395 ft with RW and one seated rest break 3/29: 520 ft with RW and one seated rest break    Time 12    Period Weeks    Status Partially Met    Target Date 12/29/20      PT LONG TERM GOAL #3   Title Patient will increase 10 meter walk test to >1.047m as to improve gait speed for better community ambulation and to reduce fall risk.    Baseline 01/25: 0.3840m2/9: 0.81 m/s with RW 3/29: 0.91 m/s with RW    Time 12    Period Weeks    Status Partially Met    Target Date 12/29/20      PT LONG TERM GOAL #4   Title After 12 weeks pt to demonstrate improved FOTO score >70    Baseline 01/25: 51 2/9: 55.97 % 3/29: 62.9%    Time 12    Period Weeks    Status Partially Met    Target Date 12/29/20      PT LONG TERM GOAL #5   Title Patient (> 60 50ars old) will complete five times sit to stand test in <12.5 seconds indicating an increased LE strength and improved balance.    Baseline 01/25: 31s;  2/9: hands on knees 13.09; 13.16sec hands-free 3/29: 12.03 seconds no UE support.    Time 12    Period Weeks    Status Achieved                 Plan - 12/24/20 1528    Clinical Impression  Statement Patient able to progress with balance today as observed with decreased overall  UE support while progressing through balance activities. Patient was more limited by fatigue requiring brief rest breaks but overall improving with more advanced activities. Pt will continue to benefit from further skilled therapy to improve balance and LE strength to improve ease with ADLs and QOL.    Personal Factors and Comorbidities Age;Comorbidity 2;Past/Current Experience;Time since onset of injury/illness/exacerbation    Comorbidities COPD, History of Cancer, myasthenia gravis, chronic lumbar surgical history.    Examination-Activity Limitations Lift;Squat;Bend;Stairs;Stand;Transfers;Insurance claims handler;Bathing;Hygiene/Grooming;Dressing;Toileting    Examination-Participation Restrictions Yard Work;Church;Cleaning;Driving    Stability/Clinical Decision Making Stable/Uncomplicated    Rehab Potential Good    Clinical Impairments Affecting Rehab Potential PMH    PT Frequency 2x / week    PT Duration 12 weeks    PT Treatment/Interventions ADLs/Self Care Home Management;Electrical Stimulation;Moist Heat;Traction;Ultrasound;Gait training;Stair training;Functional mobility training;Therapeutic activities;Therapeutic exercise;Balance training;Neuromuscular re-education;Patient/family education;Manual techniques;Passive range of motion;Dry needling;Joint Manipulations;Cryotherapy    PT Next Visit Plan balance, strength; progress standing balance, dynamic balance    PT Home Exercise Plan no changes    Consulted and Agree with Plan of Care Patient           Patient will benefit from skilled therapeutic intervention in order to improve the following deficits and impairments:  Abnormal gait,Decreased balance,Decreased mobility,Decreased endurance,Difficulty walking,Hypomobility,Impaired sensation,Decreased range of motion,Impaired perceived functional ability,Decreased activity tolerance,Decreased coordination,Decreased strength,Impaired flexibility,Postural dysfunction,[name]  Visit  Diagnosis: Abnormality of gait and mobility  Difficulty in walking, not elsewhere classified  Muscle weakness (generalized)  Other abnormalities of gait and mobility     Problem List Patient Active Problem List   Diagnosis Date Noted  . Carotid stenosis, right 09/17/2020  . S/P cervical spinal fusion   . Leukocytosis   . Essential hypertension   . Anemia of chronic disease   . Postoperative [name]   . Neuropathic [name]   . Cervical myelopathy ([place]) 08/07/2020  . Preop cardiovascular exam 07/17/2020  . SOB (shortness of breath) on exertion 07/17/2020  . Leg weakness, bilateral 07/13/2020  . Aortic atherosclerosis ([place]) 07/09/2020  . Myalgia 10/30/2019  . PAD (peripheral artery disease) ([place]) 06/06/2019  . CKD (chronic kidney disease) stage 3, GFR 30-59 ml/min (HCC) 04/29/2019  . B12 deficiency 01/23/2019  . Left arm numbness 02/28/2018  . Neck [name] 02/28/2018  . Anemia 02/02/2017  . DDD (degenerative disc disease), cervical 02/02/2017  . Hypothyroid 02/02/2017  . MRSA (methicillin resistant staph aureus) culture positive 02/02/2017  . Nocturnal hypoxia 02/02/2017  . Senile purpura ([place]) 10/03/2016  . Essential hypertension, benign 09/16/2016  . Bilateral carotid artery disease ([place]) 09/16/2016  . 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/18/2016  . 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 History of prostate cancer 12/21/2015  . Left carpal tunnel syndrome 11/11/2015  . Kidney stone on left side 07/05/2015  . Myasthenia gravis ([place]) 05/26/2015  . Long-term use of high-risk medication 10/15/2014  . Persistent cough 09/10/2014  . Pure hypercholesterolemia 07/25/2014  . Spinal stenosis, lumbar region, with neurogenic claudication 02/24/2014  . Lumbosacral stenosis with neurogenic claudication ([place]) 02/24/2014  . COPD (chronic obstructive pulmonary disease) ([place]) 01/22/2014    Lewis Moccasin, PT 12/25/2020, 2:53 PM  [place] MAIN  [facility] SERVICES [address] [place], [place], [address] Phone: [phone]   Fax:  [phone]  Name: Francisco [name] MRN: [id number] Date of Birth: 04-14-1946

## 2020-12-29 ENCOUNTER — Encounter: Payer: Medicare Other | Admitting: Occupational Therapy

## 2020-12-29 ENCOUNTER — Ambulatory Visit: Payer: Medicare Other

## 2020-12-29 ENCOUNTER — Other Ambulatory Visit: Payer: Self-pay

## 2020-12-29 DIAGNOSIS — M6281 Muscle weakness (generalized): Secondary | ICD-10-CM

## 2020-12-29 DIAGNOSIS — R269 Unspecified abnormalities of gait and mobility: Secondary | ICD-10-CM

## 2020-12-29 DIAGNOSIS — R262 Difficulty in walking, not elsewhere classified: Secondary | ICD-10-CM

## 2020-12-29 DIAGNOSIS — R2681 Unsteadiness on feet: Secondary | ICD-10-CM | POA: Diagnosis not present

## 2020-12-29 NOTE — Therapy (Signed)
[place] MAIN [facility] SERVICES [address] [place], [place], [address] Phone: [phone]   Fax:  [phone]  Physical Therapy Treatment/Recertification for dates 12/29/2020- 03/23/2021  Patient Details  Name: Francisco [name] MRN: [id number] Date of Birth: [birth year] Referring Provider (PT): Lauraine Rinne, PA-C   Encounter Date: 12/29/2020   PT End of Session - 12/29/20 1531    Visit Number 25    Number of Visits 50    Date for PT Re-Evaluation 12/29/20    Authorization Type [employer] Medicare;  2/10 Pn 12/08/20    Authorization Time Period 08/25/20-11/17/20; 10/06/20-12/29/20    PT Start Time 1530    PT Stop Time 1613    PT Time Calculation (min) 43 min    Equipment Utilized During Treatment Gait belt    Activity Tolerance Patient tolerated treatment well;No increased pain;Patient limited by fatigue    Behavior During Therapy [facility] for tasks assessed/performed           Past Medical History:  Diagnosis Date  . Arthritis    lower left hip  . Atypical angina ([place])   . Bilateral hand numbness    from back surgery  . Bronchitis, chronic ([place])   . Cancer [facility])    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; Merkel cell cancer, and Basal cell cancer (twice; 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  . Carotid stenosis   . CKD (chronic kidney disease) stage 3, GFR 30-59 ml/min (HCC)   . 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 DDD (degenerative disc disease), cervical   . Hypercholesterolemia   . Hypertension   . Hypothyroidism    pt takes Levothyroxine daily  . Lumbosacral spinal stenosis   . Myasthenia gravis, adult form ([place])   . PAD (peripheral artery disease) ([place])   . Shortness of breath    Lung MD- Dr Darlin Coco  . Sleep apnea    do not use CPAP every night    Past Surgical History:  Procedure Laterality Date  . ANTERIOR CERVICAL DECOMP/DISCECTOMY FUSION  07/18/2011   Procedure: ANTERIOR CERVICAL DECOMPRESSION/DISCECTOMY FUSION 2 LEVELS;   Surgeon: Cooper Render Pool;  Location: [place] [facility]URO ORS;  Service: Neurosurgery;  Laterality: N/A;  cervical five-six, cervical six-seven anterior cervical discectomy and fusion  . BACK SURGERY     in [place]     01/2020 Right, 04/2020 Left  . CARDIAC CATHETERIZATION     2005 at [facility], no stents  . CAROTID PTA/STENT INTERVENTION N/A 09/17/2020   Procedure: CAROTID PTA/STENT INTERVENTION;  Surgeon: Algernon Huxley, MD;  Location: [place] CV LAB;  Service: Cardiovascular;  Laterality: N/A;  . CATARACT EXTRACTION W/PHACO Left 01/06/2020   Procedure: CATARACT EXTRACTION PHACO AND INTRAOCULAR LENS PLACEMENT (IOC) ISTENT INJ LEFT 3.81  00:33.3;  Surgeon: Eulogio Bear, MD;  Location: [place];  Service: Ophthalmology;  Laterality: Left;  . CATARACT EXTRACTION W/PHACO Right 02/03/2020   Procedure: CATARACT EXTRACTION PHACO AND INTRAOCULAR LENS PLACEMENT ([place]) RIGHT ISTENT INJ;  Surgeon: Eulogio Bear, MD;  Location: [place];  Service: Ophthalmology;  Laterality: Right;  4.29 0:35.6  . COLONOSCOPY    . HERNIA REPAIR Left    inguinal hernia repair in 1985  . LUMBAR LAMINECTOMY/DECOMPRESSION MICRODISCECTOMY Left 02/24/2014   Procedure: LUMBAR LAMINECTOMY/DECOMPRESSION MICRODISCECTOMY LUMBAR THREE-FOUR, FOUR-FIVE, LEFT FIVE-SACRAL ONE ;  Surgeon: Charlie Pitter, MD;  Location: [place] [facility]UR[facility] ORS;  Service: Neurosurgery;  Laterality: Left;  LUMBAR LAMINECTOMY/DECOMPRESSION  MICRODISCECTOMY LUMBAR THREE-FOUR, FOUR-FIVE, LEFT FIVE-SACRAL ONE   . POSTERIOR CERVICAL FUSION/FORAMINOTOMY N/A 08/07/2020   Procedure: C3-6 POSTERIOR FUSION WITH DECOMPRESSION;  Surgeon: Meade Maw, MD;  Location: [facility] ORS;  Service: Neurosurgery;  Laterality: N/A;  . PROSTATECTOMY  8/14   [facility] Dr Mare Ferrari     There were no vitals filed for this visit.   Subjective Assessment - 12/29/20 1531    Subjective Patient reports that he was feeling pretty weak on  Sunday and feeling weak in the legs today.    Pertinent History Francisco [name] is a 81yoM referred to OPPT neuro s/p myelopathy s/p cervical decompression and fusion. Pt sustained several months of general decline in mobility prior to surgical decompression. Pt went to inpatient rehab after hospitalization. [facility].  Pt underwent C3-6 decompression, fusion on 11/26. PMH: myasthenia gravis, 2012 ACDF 5/6, 6/7; OSA, hypothyroidism, HTN, COPD, multiple back surgeries, 2021 carpal tunnel release, referred to OPOT, but only seen for evaluation. 8/10 OPPT evaluation for LSS c radiating pain into the left hip, only completed evaluation session. September - November progressive decline in leg strength and tolerance to upright activity. Also noted a decline in tool manipulation for ADL (pen and meal utensil). Seen by OT and PT as an outpatient for other potential etiology (Carpal tunnel syndrome, and lumbar spinal stenosis), but eventually identified to be related to cervical myelopathy. Several months prior has no limitations in mobility or function. Pt scheduled for Jan 6th: Carotid endarterectomy c Dr. Leotis Pain. Pt has a long history of lumbar DJD, s/p L5/S1 decompression discectomy x2, L5 nerve root injections. Most recent Lumbar MRI (11/10/18) revealin gof advanced formaminal stenosis of L3/4, L4/5, L5/S1. Pt is has had LLE neurological weakness and pain since 2015 and has been followed by neurosurgery.    Limitations Walking;Lifting;Standing;House hold activities    How long can you sit comfortably? Not limited    How long can you stand comfortably? 3-5 minutes    How long can you walk comfortably? 10 minutes    Patient Stated Goals return to functional baseline status of Summer 2021.    Currently in Pain? No/denies                   Reassessed functional outcomes and goals.- See goal section for specifics.   TUG= 12.15 sec using RW 6 min walk= 550 feet using RW 10MWT=  0.9 m/s using RW  Vitals: 148/70mHg Left UE   Clinical Impression: Patient able to demonstrate improvement with functional endurance today and slight progress with TUG using his RW. He was fatigued overall today so visit limited to assessing LTG. Patient has progressed overall with mobility and functional endurance but still at risk for falling and still limited with functional capabilities. Patient's condition has the potential to improve in response to therapy. Maximum improvement is yet to be obtained. The anticipated improvement is attainable and reasonable in a generally predictable time.  Patient reports  Pt will continue to benefit from further skilled therapy to improve balance and LE strength to improve ease with ADLs and QOL                  PT Education - 12/30/20 1718    Education provided Yes    Education Details exercise technique, purpose of functional outcome testing.    Person(s) Educated Patient    Methods Explanation;Demonstration;Tactile cues;Verbal cues    Comprehension Verbalized understanding;Returned demonstration;Verbal cues required;Need further instruction  PT Short Term Goals - 12/08/20 1357      PT SHORT TERM GOAL #1   Title After 6 weeks patient will be independent in HEP to progress strength, AMB, and balance.    Baseline 2/9: HEP compliant 3/29: HEP compliant    Time 6    Period Weeks    Status Achieved    Target Date 11/17/20      PT SHORT TERM GOAL #2   Title After 6 weeks pt will demonstrate improved 5xSTS hands free chair + airex foam in <20sec    Baseline 01/25: 31s hands free without airex foam 2/9: unable to do on airex 3/29 12.03 seconds no UE support required    Time 6    Period Weeks    Status Achieved    Target Date --             PT Long Term Goals - 12/29/20 1541      PT LONG TERM GOAL #1   Title Patient will reduce timed up and go to <11 seconds to reduce fall risk and demonstrate improved  transfer/gait ability.    Baseline 01/25: 30.26s 2/9: 19.55 seconds with RW 3/29: 12.36 seconds with RW.   12/29/2020= 12:15 sec with RW    Time 12    Period Weeks    Status Partially Met    Target Date 03/23/21      PT LONG TERM GOAL #2   Title Patient will demonstrate 6MWT (LRAD) >717f to demonstrate improved capacity to access the community.    Baseline 12/16: 120 ft, 01/25: 165 ft with 1 seated rest break for 2 mins 2/9: 395 ft with RW and one seated rest break 3/29: 520 ft with RW and one seated rest break. 12/29/2020= 550 feet using RW.    Time 12    Period Weeks    Status Partially Met    Target Date 03/23/21      PT LONG TERM GOAL #3   Title Patient will increase 10 meter walk test to >1.04m as to improve gait speed for better community ambulation and to reduce fall risk.    Baseline 01/25: 0.3871m2/9: 0.81 m/s with RW 3/29: 0.91 m/s with RW. 12/29/2020=0.91 m/s with RW.    Time 12    Period Weeks    Status Partially Met    Target Date 03/23/21      PT LONG TERM GOAL #4   Title After 12 weeks pt to demonstrate improved FOTO score >70    Baseline 01/25: 51 2/9: 55.97 % 3/29: 62.9% 12/29/2020= 56%    Time 12    Period Weeks    Status On-going      PT LONG TERM GOAL #5   Title Patient (> 60 72ars old) will complete five times sit to stand test in <12.5 seconds indicating an increased LE strength and improved balance.    Baseline 01/25: 31s;  2/9: hands on knees 13.09; 13.16sec hands-free 3/29: 12.03 seconds no UE support.    Time 12    Period Weeks    Status Achieved                 Plan - 12/29/20 1719    Clinical Impression Statement Patient able to demonstrate improvement with functional endurance today and slight progress with TUG using his RW. He was fatigued overall today so visit limited to assessing LTG. Patient has progressed overall with mobility and functional endurance but still at risk for falling and  still limited with functional capabilities.   Patient's condition has the potential to improve in response to therapy. Maximum improvement is yet to be obtained. The anticipated improvement is attainable and reasonable in a generally predictable time.  Patient reports  Pt will continue to benefit from further skilled therapy to improve balance and LE strength to improve ease with ADLs and QOL    Personal Factors and Comorbidities Age;Comorbidity 2;Past/Current Experience;Time since onset of injury/illness/exacerbation    Comorbidities COPD, History of Cancer, myasthenia gravis, chronic lumbar surgical history.    Examination-Activity Limitations Lift;Squat;Bend;Stairs;Stand;Transfers;Insurance claims handler;Bathing;Hygiene/Grooming;Dressing;Toileting    Examination-Participation Restrictions Yard Work;Church;Cleaning;Driving    Stability/Clinical Decision Making Stable/Uncomplicated    Rehab Potential Good    Clinical Impairments Affecting Rehab Potential PMH    PT Frequency 2x / week    PT Duration 12 weeks    PT Treatment/Interventions ADLs/Self Care Home Management;Electrical Stimulation;Moist Heat;Traction;Ultrasound;Gait training;Stair training;Functional mobility training;Therapeutic activities;Therapeutic exercise;Balance training;Neuromuscular re-education;Patient/family education;Manual techniques;Passive range of motion;Dry needling;Joint Manipulations;Cryotherapy    PT Next Visit Plan balance, strength; progress standing balance, dynamic balance    PT Home Exercise Plan no changes    Consulted and Agree with Plan of Care Patient           Patient will benefit from skilled therapeutic intervention in order to improve the following deficits and impairments:  Abnormal gait,Decreased balance,Decreased mobility,Decreased endurance,Difficulty walking,Hypomobility,Impaired sensation,Decreased range of motion,Impaired perceived functional ability,Decreased activity tolerance,Decreased coordination,Decreased strength,Impaired  flexibility,Postural dysfunction,Pain  Visit Diagnosis: Abnormality of gait and mobility  Difficulty in walking, not elsewhere classified  Muscle weakness (generalized)     Problem List Patient Active Problem List   Diagnosis Date Noted  . Carotid stenosis, right 09/17/2020  . S/P cervical spinal fusion   . Leukocytosis   . Essential hypertension   . Anemia of chronic disease   . Postoperative pain   . Neuropathic pain   . Cervical myelopathy ([place]) 08/07/2020  . Preop cardiovascular exam 07/17/2020  . SOB (shortness of breath) on exertion 07/17/2020  . Leg weakness, bilateral 07/13/2020  . Aortic atherosclerosis ([place]) 07/09/2020  . Myalgia 10/30/2019  . PAD (peripheral artery disease) ([place]) 06/06/2019  . CKD (chronic kidney disease) stage 3, GFR 30-59 ml/min (HCC) 04/29/2019  . B12 deficiency 01/23/2019  . Left arm numbness 02/28/2018  . Neck pain 02/28/2018  . Anemia 02/02/2017  . DDD (degenerative disc disease), cervical 02/02/2017  . Hypothyroid 02/02/2017  . MRSA (methicillin resistant staph aureus) culture positive 02/02/2017  . Nocturnal hypoxia 02/02/2017  . Senile purpura ([place]) 10/03/2016  . Essential hypertension, benign 09/16/2016  . Bilateral carotid artery disease ([place]) 09/16/2016  . 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/18/2016  . 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 History of prostate cancer 12/21/2015  . Left carpal tunnel syndrome 11/11/2015  . Kidney stone on left side 07/05/2015  . Myasthenia gravis ([place]) 05/26/2015  . Long-term use of high-risk medication 10/15/2014  . Persistent cough 09/10/2014  . Pure hypercholesterolemia 07/25/2014  . Spinal stenosis, lumbar region, with neurogenic claudication 02/24/2014  . Lumbosacral stenosis with neurogenic claudication ([place]) 02/24/2014  . COPD (chronic obstructive pulmonary disease) ([place]) 01/22/2014    Lewis Moccasin, PT 12/30/2020, 5:45 PM  [place]  MAIN [facility] SERVICES [address] [place], [place], [address] Phone: [phone]   Fax:  [phone]  Name: Francisco [name] MRN: [id number] Date of Birth: 01/28/1946

## 2020-12-31 ENCOUNTER — Encounter: Payer: Medicare Other | Admitting: Occupational Therapy

## 2020-12-31 ENCOUNTER — Ambulatory Visit: Payer: Medicare Other

## 2021-01-01 ENCOUNTER — Ambulatory Visit (INDEPENDENT_AMBULATORY_CARE_PROVIDER_SITE_OTHER): Payer: Medicare Other

## 2021-01-01 ENCOUNTER — Other Ambulatory Visit: Payer: Self-pay

## 2021-01-01 ENCOUNTER — Ambulatory Visit (INDEPENDENT_AMBULATORY_CARE_PROVIDER_SITE_OTHER): Payer: Medicare Other | Admitting: Vascular Surgery

## 2021-01-01 VITALS — BP 155/65 | HR 70 | Ht 70.0 in | Wt 233.0 lb

## 2021-01-01 DIAGNOSIS — I6523 Occlusion and stenosis of bilateral carotid arteries: Secondary | ICD-10-CM

## 2021-01-01 DIAGNOSIS — I10 Essential (primary) hypertension: Secondary | ICD-10-CM

## 2021-01-01 DIAGNOSIS — E7800 Pure hypercholesterolemia, unspecified: Secondary | ICD-10-CM | POA: Diagnosis not present

## 2021-01-01 DIAGNOSIS — I6521 Occlusion and stenosis of right carotid artery: Secondary | ICD-10-CM | POA: Diagnosis not present

## 2021-01-01 DIAGNOSIS — J439 Emphysema, unspecified: Secondary | ICD-10-CM | POA: Diagnosis not present

## 2021-01-01 DIAGNOSIS — Z9889 Other specified postprocedural states: Secondary | ICD-10-CM | POA: Insufficient documentation

## 2021-01-01 NOTE — Progress Notes (Signed)
MRN : [id number]  Francisco [name] is a 75 y.o. (1946/01/21) male who presents with chief complaint of  Chief Complaint  Patient presents with  . Follow-up  . Carotid    3 Mo Carotid  .  History of Present Illness: Carotid disease follow-up.  Patient returns approximately 3-1/2 months after right carotid artery stent placement for high-grade stenosis with an unstable cervical spine status post cervical discectomy.  Between that and his myasthenia gravis, he was a poor candidate for open surgical therapy.  He did well after his carotid stent placement.  He continues to rehabilitate and work with his legs but he is getting stronger and more mobile.  He is very thankful today.  He has no complaints today.  Duplex today shows his right carotid stent to be widely patent with 40 to 59% left ICA stenosis.  Current Outpatient Medications  Medication Sig Dispense Refill  . aspirin EC 81 MG EC tablet Take 1 tablet (81 mg total) by mouth daily at 6 (six) AM. Swallow whole. 30 tablet 11  . atorvastatin (LIPITOR) 10 MG tablet Take 1 tablet (10 mg total) by mouth daily. 30 tablet 10  . azaTHIOprine (IMURAN) 50 MG tablet Take 150 mg by mouth daily.     . clopidogrel (PLAVIX) 75 MG tablet Take 1 tablet (75 mg total) by mouth daily at 6 (six) AM. 30 tablet 10  . furosemide (LASIX) 20 MG tablet Take 20 mg by mouth 2 (two) times daily as needed (swelling).     Marland Kitchen HYDROcodone-acetaminophen (NORCO/VICODIN) 5-325 MG tablet Take 1 tablet by mouth 2 (two) times daily as needed for moderate pain.    . iron polysaccharides (NIFEREX) 150 MG capsule Take 1 capsule (150 mg total) by mouth 2 (two) times daily before lunch and supper. 60 capsule 0  . levothyroxine (SYNTHROID, LEVOTHROID) 88 MCG tablet Take 88 mcg by mouth daily.    Marland Kitchen losartan (COZAAR) 50 MG tablet Take 50 mg by mouth 2 (two) times daily.     Marland Kitchen oxybutynin (DITROPAN) 5 MG tablet Take 5 mg by mouth 3 (three) times daily.    . pantoprazole (PROTONIX) 40  MG tablet Take 40 mg by mouth 2 (two) times daily before a meal.     . predniSONE (DELTASONE) 20 MG tablet Take 20 mg by mouth every other day.     . pyridostigmine (MESTINON) 60 MG tablet Take 60 mg by mouth in the morning and at bedtime.     Marland Kitchen rOPINIRole (REQUIP) 4 MG tablet Take 4 mg by mouth at bedtime.     . traZODone (DESYREL) 50 MG tablet Take 0.5 tablets (25 mg total) by mouth at bedtime as needed for sleep. 15 tablet 0  . acetaminophen (TYLENOL) 325 MG tablet Take 2 tablets (650 mg total) by mouth every 6 (six) hours as needed for mild pain. (Patient not taking: No sig reported)    . alum & mag hydroxide-simeth (MAALOX/MYLANTA) 200-200-20 MG/5ML suspension Take 30 mLs by mouth every 6 (six) hours as needed for indigestion. (Patient not taking: No sig reported) 355 mL 0  . magnesium oxide (MAG-OX) 400 (241.3 Mg) MG tablet Take 1 tablet (400 mg total) by mouth daily. (Patient not taking: No sig reported) 30 tablet 0  . polyethylene glycol (MIRALAX / GLYCOLAX) 17 g packet Take 17 g by mouth daily. May increase to twice a day if no BM in 24 hours (Patient not taking: No sig reported) 60 each 0  .  pregabalin (LYRICA) 100 MG capsule Take 1 capsule (100 mg total) by mouth 2 (two) times daily. (Patient not taking: No sig reported) 60 capsule 0  . senna (SENOKOT) 8.6 MG TABS tablet Take 2 tablets (17.2 mg total) by mouth daily. (Patient not taking: No sig reported) 120 tablet 0  . vitamin B-12 (CYANOCOBALAMIN) 1000 MCG tablet Take 1,000 mcg by mouth daily. (Patient not taking: No sig reported)     No current facility-administered medications for this visit.    Past Medical History:  Diagnosis Date  . Arthritis    lower left hip  . Atypical angina (HCC)   . Bilateral hand numbness    from back surgery  . Bronchitis, chronic (HCC)   . Cancer [facility])    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; Merkel cell cancer, and Basal cell cancer (twice; 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  . Carotid stenosis   . CKD (chronic kidney  disease) stage 3, GFR 30-59 ml/min (HCC)   . 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 DDD (degenerative disc disease), cervical   . Hypercholesterolemia   . Hypertension   . Hypothyroidism    pt takes Levothyroxine daily  . Lumbosacral spinal stenosis   . Myasthenia gravis, adult form (HCC)   . PAD (peripheral artery disease) (HCC)   . Shortness of breath    Lung MD- Dr Joetta Manners  . Sleep apnea    do not use CPAP every night    Past Surgical History:  Procedure Laterality Date  . ANTERIOR CERVICAL DECOMP/DISCECTOMY FUSION  07/18/2011   Procedure: ANTERIOR CERVICAL DECOMPRESSION/DISCECTOMY FUSION 2 LEVELS;  Surgeon: Kathaleen Maser Pool;  Location: [facility];  Service: Neurosurgery;  Laterality: N/A;  cervical five-six, cervical six-seven anterior cervical discectomy and fusion  . BACK SURGERY     in 1985 [facility]  . BILATERAL CARPAL TUNNEL RELEASE     01/2020 Right, 04/2020 Left  . CARDIAC CATHETERIZATION     2005 at [facility], no stents  . CAROTID PTA/STENT INTERVENTION N/A 09/17/2020   Procedure: CAROTID PTA/STENT INTERVENTION;  Surgeon: Annice Needy, MD;  Location: [facility] INVASIVE CV LAB;  Service: Cardiovascular;  Laterality: N/A;  . CATARACT EXTRACTION W/PHACO Left 01/06/2020   Procedure: CATARACT EXTRACTION PHACO AND INTRAOCULAR LENS PLACEMENT (IOC) ISTENT INJ LEFT 3.81  00:33.3;  Surgeon: Nevada Crane, MD;  Location: [facility];  Service: Ophthalmology;  Laterality: Left;  . CATARACT EXTRACTION W/PHACO Right 02/03/2020   Procedure: CATARACT EXTRACTION PHACO AND INTRAOCULAR LENS PLACEMENT (IOC) RIGHT ISTENT INJ;  Surgeon: Nevada Crane, MD;  Location: [facility];  Service: Ophthalmology;  Laterality: Right;  4.29 0:35.6  . COLONOSCOPY    . HERNIA REPAIR Left    inguinal hernia repair in 1985  . LUMBAR LAMINECTOMY/DECOMPRESSION MICRODISCECTOMY Left 02/24/2014   Procedure: LUMBAR LAMINECTOMY/DECOMPRESSION MICRODISCECTOMY LUMBAR  THREE-FOUR, FOUR-FIVE, LEFT FIVE-SACRAL ONE ;  Surgeon: Temple Pacini, MD;  Location: [facility];  Service: Neurosurgery;  Laterality: Left;  LUMBAR LAMINECTOMY/DECOMPRESSION MICRODISCECTOMY LUMBAR THREE-FOUR, FOUR-FIVE, LEFT FIVE-SACRAL ONE   . POSTERIOR CERVICAL FUSION/FORAMINOTOMY N/A 08/07/2020   Procedure: C3-6 POSTERIOR FUSION WITH DECOMPRESSION;  Surgeon: Venetia Night, MD;  Location: [facility] ORS;  Service: Neurosurgery;  Laterality: N/A;  . PROSTATECTOMY  8/14   [facility] Dr Joycelyn Das      Social History   Tobacco Use  . Smoking status: Former Smoker    Packs/day: 1.00    Years: 20.00    Pack years: 20.00  Types: Cigarettes    Quit date: 09/12/2001    Years since quitting: 19.3  . Smokeless tobacco: Never Used  Vaping Use  . Vaping Use: Never used  Substance Use Topics  . Alcohol use: Yes    Alcohol/week: 3.0 standard drinks    Types: 3 Glasses of wine per week    Comment: 3 glasses a wine a week  . Drug use: No      Family History  Problem Relation Age of Onset  . Hypertension Mother   . Stroke Mother   . Stroke Father      Allergies  Allergen Reactions  . Azithromycin Other (See Comments)    Avoid due to myasthenia gravis  . Codeine Nausea And Vomiting     REVIEW OF SYSTEMS(Negative unless checked)  Constitutional: [] ???Weight loss[] ???Fever[] ???Chills Cardiac:[] ???Chest pain[] ???Chest pressure[] ???Palpitations [] ???Shortness of breath when laying flat [] ???Shortness of breath at rest [] ???Shortness of breath with exertion. Vascular: [] ???Pain in legs with walking[] ???Pain in legsat rest[] ???Pain in legs when laying flat [] ???Claudication [] ???Pain in feet when walking [] ???Pain in feet at rest [] ???Pain in feet when laying flat [] ???History of DVT [] ???Phlebitis [] ???Swelling in legs [] ???Varicose veins [] ???Non-healing ulcers Pulmonary: [] ???Uses home oxygen [] ???Productive cough[] ???Hemoptysis  [] ???Wheeze [x] ???COPD [] ???Asthma Neurologic: [] ???Dizziness [] ???Blackouts [] ???Seizures [] ???History of stroke [] ???History of TIA[] ???Aphasia [] ???Temporary blindness[] ???Dysphagia [] ???Weaknessor numbness in arms [] ???Weakness or numbnessin legs Musculoskeletal: [x] ???Arthritis [] ???Joint swelling [x] ???Joint pain [x] ???Low back pain Hematologic:[] ???Easy bruising[] ???Easy bleeding [] ???Hypercoagulable state [x] ???Anemic [] ???Hepatitis Gastrointestinal:[] ???Blood in stool[] ???Vomiting blood[] ???Gastroesophageal reflux/heartburn[] ???Difficulty swallowing. Genitourinary: [] ???Chronic kidney disease [] ???Difficulturination [] ???Frequenturination [] ???Burning with urination[] ???Blood in urine Skin: [] ???Rashes [] ???Ulcers [] ???Wounds Psychological: [] ???History of anxiety[] ???History of major depression.    Physical Examination  Vitals:   01/01/21 1114  BP: (!) 155/65  Pulse: 70  Weight: 233 lb (105.7 kg)  Height: 5\' 10"  (1.778 m)   Body mass index is 33.43 kg/m. Gen:  WD/WN, NAD Head: [place]/AT, No temporalis wasting. Ear/Nose/Throat: Hearing grossly intact, nares w/o erythema or drainage, trachea midline Eyes: Conjunctiva clear. Sclera non-icteric Neck: Supple.  Soft bilateral bruit  Pulmonary:  Good air movement, equal and clear to auscultation bilaterally.  Cardiac: RRR, No JVD Vascular:  Vessel Right Left  Radial Palpable Palpable            Musculoskeletal: M/S 5/5 throughout.  No deformity or atrophy. Walking with a walker. Mild LE edema. Neurologic: CN 2-12 intact. Sensation grossly intact in extremities.  Symmetrical.  Speech is fluent. Motor exam as listed above. Psychiatric: Judgment intact, Mood & affect appropriate for pt's clinical situation. Dermatologic: No rashes or ulcers noted.  No cellulitis or open wounds.      CBC Lab Results  Component Value Date   WBC 7.2 10/28/2020   HGB 9.8  (L) 10/28/2020   HCT 30.7 (L) 10/28/2020   MCV 104.4 (H) 10/28/2020   PLT 304 10/28/2020    BMET    Component Value Date/Time   NA 136 10/28/2020 1632   NA 141 03/04/2014 0450   K 3.6 10/28/2020 1632   K 3.9 03/04/2014 0450   CL 98 10/28/2020 1632   CL 104 03/04/2014 0450   CO2 28 10/28/2020 1632   CO2 28 03/04/2014 0450   GLUCOSE 92 10/28/2020 1632   GLUCOSE 100 (H) 03/04/2014 0450   BUN 31 (H) 10/28/2020 1632   BUN 25 (H) 03/04/2014 0450   CREATININE 1.40 (H) 10/28/2020 1632   CREATININE 0.99 03/04/2014 0450   CALCIUM 8.9 10/28/2020 1632   CALCIUM 9.0 03/04/2014 0450   GFRNONAA 52 (L) 10/28/2020 1632  GFRNONAA >60 03/04/2014 0450   GFRAA >60 03/04/2014 0450   CrCl cannot be calculated (Patient's most recent lab result is older than the maximum 21 days allowed.).  COAG Lab Results  Component Value Date   INR 0.9 07/30/2020    Radiology No results found.   Assessment/Plan COPD (chronic obstructive pulmonary disease) Followed by Pulmonology  Essential hypertension, benign blood pressure control important in reducing the progression of atherosclerotic disease. On appropriate oral medications.  Pure hypercholesterolemia lipid control important in reducing the progression of atherosclerotic disease. Continue statin therapy  Bilateral carotid artery disease (HCC) Duplex today shows his right carotid stent to be widely patent with 40 to 59% left ICA stenosis.  Doing well.  Continue current medical regimen.  Recheck in 6 months.    Leotis Pain, MD  01/01/2021 1:24 PM    This note was created with Dragon medical transcription system.  Any errors from dictation are purely unintentional

## 2021-01-01 NOTE — Assessment & Plan Note (Signed)
Duplex today shows his right carotid stent to be widely patent with 40 to 59% left ICA stenosis.  Doing well.  Continue current medical regimen.  Recheck in 6 months.

## 2021-01-05 ENCOUNTER — Encounter: Payer: Medicare Other | Admitting: Occupational Therapy

## 2021-01-05 ENCOUNTER — Other Ambulatory Visit: Payer: Self-pay

## 2021-01-05 ENCOUNTER — Ambulatory Visit: Payer: Medicare Other

## 2021-01-05 DIAGNOSIS — R2681 Unsteadiness on feet: Secondary | ICD-10-CM | POA: Diagnosis not present

## 2021-01-05 DIAGNOSIS — M6281 Muscle weakness (generalized): Secondary | ICD-10-CM

## 2021-01-05 DIAGNOSIS — R262 Difficulty in walking, not elsewhere classified: Secondary | ICD-10-CM

## 2021-01-05 DIAGNOSIS — R269 Unspecified abnormalities of gait and mobility: Secondary | ICD-10-CM

## 2021-01-05 NOTE — Therapy (Signed)
[place] MAIN [facility] SERVICES [address] [place], [place], [address] Phone: [phone]   Fax:  [phone]  Physical Therapy Treatment  Patient Details  Name: Francisco [name] MRN: [id number] Date of Birth: [birth year] Referring Provider (PT): Lauraine Rinne, PA-C   Encounter Date: 01/05/2021   PT End of Session - 01/05/21 1637    Visit Number 26    Number of Visits 50    Date for PT Re-Evaluation 12/29/20    Authorization Type [employer] Medicare;  2/10 Pn 12/08/20    Authorization Time Period 08/25/20-11/17/20; 10/06/20-12/29/20    PT Start Time 1533    PT Stop Time 1614    PT Time Calculation (min) 41 min    Equipment Utilized During Treatment Gait belt    Activity Tolerance Patient tolerated treatment well;No increased pain;Patient limited by fatigue    Behavior During Therapy [facility] for tasks assessed/performed           Past Medical History:  Diagnosis Date  . Arthritis    lower left hip  . Atypical angina ([place])   . Bilateral hand numbness    from back surgery  . Bronchitis, chronic ([place])   . Cancer [facility])    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; Merkel cell cancer, and Basal cell cancer (twice; 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  . Carotid stenosis   . CKD (chronic kidney disease) stage 3, GFR 30-59 ml/min (HCC)   . 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 DDD (degenerative disc disease), cervical   . Hypercholesterolemia   . Hypertension   . Hypothyroidism    pt takes Levothyroxine daily  . Lumbosacral spinal stenosis   . Myasthenia gravis, adult form ([place])   . PAD (peripheral artery disease) ([place])   . Shortness of breath    Lung MD- Dr Darlin Coco  . Sleep apnea    do not use CPAP every night    Past Surgical History:  Procedure Laterality Date  . ANTERIOR CERVICAL DECOMP/DISCECTOMY FUSION  07/18/2011   Procedure: ANTERIOR CERVICAL DECOMPRESSION/DISCECTOMY FUSION 2 LEVELS;  Surgeon: Cooper Render Pool;  Location: [place] NEURO ORS;   Service: Neurosurgery;  Laterality: N/A;  cervical five-six, cervical six-seven anterior cervical discectomy and fusion  . BACK SURGERY     in [place]     01/2020 Right, 04/2020 Left  . CARDIAC CATHETERIZATION     2005 at [facility], no stents  . CAROTID PTA/STENT INTERVENTION N/A 09/17/2020   Procedure: CAROTID PTA/STENT INTERVENTION;  Surgeon: Algernon Huxley, MD;  Location: [place] CV LAB;  Service: Cardiovascular;  Laterality: N/A;  . CATARACT EXTRACTION W/PHACO Left 01/06/2020   Procedure: CATARACT EXTRACTION PHACO AND INTRAOCULAR LENS PLACEMENT (IOC) ISTENT INJ LEFT 3.81  00:33.3;  Surgeon: Eulogio Bear, MD;  Location: [place];  Service: Ophthalmology;  Laterality: Left;  . CATARACT EXTRACTION W/PHACO Right 02/03/2020   Procedure: CATARACT EXTRACTION PHACO AND INTRAOCULAR LENS PLACEMENT ([place]) RIGHT ISTENT INJ;  Surgeon: Eulogio Bear, MD;  Location: [place];  Service: Ophthalmology;  Laterality: Right;  4.29 0:35.6  . COLONOSCOPY    . HERNIA REPAIR Left    inguinal hernia repair in 1985  . LUMBAR LAMINECTOMY/DECOMPRESSION MICRODISCECTOMY Left 02/24/2014   Procedure: LUMBAR LAMINECTOMY/DECOMPRESSION MICRODISCECTOMY LUMBAR THREE-FOUR, FOUR-FIVE, LEFT FIVE-SACRAL ONE ;  Surgeon: Charlie Pitter, MD;  Location: [place] NEURO ORS;  Service: Neurosurgery;  Laterality: Left;  LUMBAR LAMINECTOMY/DECOMPRESSION MICRODISCECTOMY LUMBAR THREE-FOUR, FOUR-FIVE,  LEFT FIVE-SACRAL ONE   . POSTERIOR CERVICAL FUSION/FORAMINOTOMY N/A 08/07/2020   Procedure: C3-6 POSTERIOR FUSION WITH DECOMPRESSION;  Surgeon: Meade Maw, MD;  Location: [facility] ORS;  Service: Neurosurgery;  Laterality: N/A;  . PROSTATECTOMY  8/14   [facility] Dr Mare Ferrari     There were no vitals filed for this visit.   Subjective Assessment - 01/05/21 1541    Subjective Patient reports that he was feeling pretty weak on Sunday and feeling weak in the legs today.     Pertinent History Francisco [name] is a 74yoM referred to OPPT neuro s/p myelopathy s/p cervical decompression and fusion. Pt sustained several months of general decline in mobility prior to surgical decompression. Pt went to inpatient rehab after hospitalization. [facility].  Pt underwent C3-6 decompression, fusion on 11/26. PMH: myasthenia gravis, 2012 ACDF 5/6, 6/7; OSA, hypothyroidism, HTN, COPD, multiple back surgeries, 2021 carpal tunnel release, referred to OPOT, but only seen for evaluation. 8/10 OPPT evaluation for LSS c radiating pain into the left hip, only completed evaluation session. September - November progressive decline in leg strength and tolerance to upright activity. Also noted a decline in tool manipulation for ADL (pen and meal utensil). Seen by OT and PT as an outpatient for other potential etiology (Carpal tunnel syndrome, and lumbar spinal stenosis), but eventually identified to be related to cervical myelopathy. Several months prior has no limitations in mobility or function. Pt scheduled for Jan 6th: Carotid endarterectomy c Dr. Leotis Pain. Pt has a long history of lumbar DJD, s/p L5/S1 decompression discectomy x2, L5 nerve root injections. Most recent Lumbar MRI (11/10/18) revealin gof advanced formaminal stenosis of L3/4, L4/5, L5/S1. Pt is has had LLE neurological weakness and pain since 2015 and has been followed by neurosurgery.    Limitations Walking;Lifting;Standing;House hold activities    How long can you sit comfortably? Not limited    How long can you stand comfortably? 3-5 minutes    How long can you walk comfortably? 10 minutes    Patient Stated Goals return to functional baseline status of Summer 2021.             NuStep L2 LE only  X 5 min 30 sec at total distance of 0.25 mi. Patient rates as "medium."  O2 sat =97% and HR=91 bpm  Attempted Leg press yet patient uncomfortable in position and unable to perform.   Seated ham curl using matrix  cable system at 7.5 # B LE x 20 reps   Attempted standing hip abd- 2.5# on matrix cable system- Patient unable to perform secondary to some left LE discomfort and weakness.  Static stand with feet apart on blue airex pad while dual task using horizontal bar activity- moving ringed balls from low horizontal column up 4 levels (bottom to top then back down)   Same as above except feet narrowed on blue aired pad- Patient able to complete  Staggered stance on blue airex pad with EO and hold x 20 sec x 2 trials. Tandem stance on blue airex pad x 10-15 sec hold each LE x 2 trials.  Education provided throughout session via VC/TC and demonstration to facilitate movement at target joints and correct muscle activation for all testing and exercises performed.   Clinical Impression: Patient challenged with attempted therex and unable to perform leg press or matrix standing cable exercises. Patient performed well with standing without UE support with improving ability. He will continue to benefit from further skilled therapy to improve  balance and LE strength to improve ease with ADLs and QOL                        PT Short Term Goals - 12/08/20 1357      PT SHORT TERM GOAL #1   Title After 6 weeks patient will be independent in HEP to progress strength, AMB, and balance.    Baseline 2/9: HEP compliant 3/29: HEP compliant    Time 6    Period Weeks    Status Achieved    Target Date 11/17/20      PT SHORT TERM GOAL #2   Title After 6 weeks pt will demonstrate improved 5xSTS hands free chair + airex foam in <20sec    Baseline 01/25: 31s hands free without airex foam 2/9: unable to do on airex 3/29 12.03 seconds no UE support required    Time 6    Period Weeks    Status Achieved    Target Date --             PT Long Term Goals - 12/29/20 1541      PT LONG TERM GOAL #1   Title Patient will reduce timed up and go to <11 seconds to reduce fall risk and demonstrate  improved transfer/gait ability.    Baseline 01/25: 30.26s 2/9: 19.55 seconds with RW 3/29: 12.36 seconds with RW.   12/29/2020= 12:15 sec with RW    Time 12    Period Weeks    Status Partially Met    Target Date 03/23/21      PT LONG TERM GOAL #2   Title Patient will demonstrate 6MWT (LRAD) >710f to demonstrate improved capacity to access the community.    Baseline 12/16: 120 ft, 01/25: 165 ft with 1 seated rest break for 2 mins 2/9: 395 ft with RW and one seated rest break 3/29: 520 ft with RW and one seated rest break. 12/29/2020= 550 feet using RW.    Time 12    Period Weeks    Status Partially Met    Target Date 03/23/21      PT LONG TERM GOAL #3   Title Patient will increase 10 meter walk test to >1.076m as to improve gait speed for better community ambulation and to reduce fall risk.    Baseline 01/25: 0.3863m2/9: 0.81 m/s with RW 3/29: 0.91 m/s with RW. 12/29/2020=0.91 m/s with RW.    Time 12    Period Weeks    Status Partially Met    Target Date 03/23/21      PT LONG TERM GOAL #4   Title After 12 weeks pt to demonstrate improved FOTO score >70    Baseline 01/25: 51 2/9: 55.97 % 3/29: 62.9% 12/29/2020= 56%    Time 12    Period Weeks    Status On-going      PT LONG TERM GOAL #5   Title Patient (> 60 32ars old) will complete five times sit to stand test in <12.5 seconds indicating an increased LE strength and improved balance.    Baseline 01/25: 31s;  2/9: hands on knees 13.09; 13.16sec hands-free 3/29: 12.03 seconds no UE support.    Time 12    Period Weeks    Status Achieved                 Plan - 01/05/21 1636    Clinical Impression Statement Patient challenged with attempted therex and unable to perform leg  press or matrix standing cable exercises. Patient performed well with standing without UE support with improving ability. He will continue to benefit from further skilled therapy to improve balance and LE strength to improve ease with ADLs and QOL    Personal  Factors and Comorbidities Age;Comorbidity 2;Past/Current Experience;Time since onset of injury/illness/exacerbation    Comorbidities COPD, History of Cancer, myasthenia gravis, chronic lumbar surgical history.    Examination-Activity Limitations Lift;Squat;Bend;Stairs;Stand;Transfers;Insurance claims handler;Bathing;Hygiene/Grooming;Dressing;Toileting    Examination-Participation Restrictions Yard Work;Church;Cleaning;Driving    Stability/Clinical Decision Making Stable/Uncomplicated    Rehab Potential Good    Clinical Impairments Affecting Rehab Potential PMH    PT Frequency 2x / week    PT Duration 12 weeks    PT Treatment/Interventions ADLs/Self Care Home Management;Electrical Stimulation;Moist Heat;Traction;Ultrasound;Gait training;Stair training;Functional mobility training;Therapeutic activities;Therapeutic exercise;Balance training;Neuromuscular re-education;Patient/family education;Manual techniques;Passive range of motion;Dry needling;Joint Manipulations;Cryotherapy    PT Next Visit Plan balance, strength; progress standing balance, dynamic balance    PT Home Exercise Plan no changes    Consulted and Agree with Plan of Care Patient           Patient will benefit from skilled therapeutic intervention in order to improve the following deficits and impairments:  Abnormal gait,Decreased balance,Decreased mobility,Decreased endurance,Difficulty walking,Hypomobility,Impaired sensation,Decreased range of motion,Impaired perceived functional ability,Decreased activity tolerance,Decreased coordination,Decreased strength,Impaired flexibility,Postural dysfunction,Pain  Visit Diagnosis: Abnormality of gait and mobility  Difficulty in walking, not elsewhere classified  Muscle weakness (generalized)  Unsteadiness on feet     Problem List Patient Active Problem List   Diagnosis Date Noted  . History of decompression of median nerve 01/01/2021  . Paroxysmal atrial  fibrillation ([place]) 10/06/2020  . Carotid stenosis, right 09/17/2020  . S/P cervical spinal fusion   . Leukocytosis   . Essential hypertension   . Anemia of chronic disease   . Postoperative pain   . Neuropathic pain   . Cervical myelopathy ([place]) 08/07/2020  . Preop cardiovascular exam 07/17/2020  . SOB (shortness of breath) on exertion 07/17/2020  . Leg weakness, bilateral 07/13/2020  . Aortic atherosclerosis ([place]) 07/09/2020  . Body mass index (BMI) 34.0-34.9, adult 12/25/2019  . Myalgia 10/30/2019  . Lumbar post-laminectomy syndrome 10/24/2019  . PAD (peripheral artery disease) ([place]) 06/06/2019  . CKD (chronic kidney disease) stage 3, GFR 30-59 ml/min (HCC) 04/29/2019  . B12 deficiency 01/23/2019  . Left arm numbness 02/28/2018  . Neck pain 02/28/2018  . Anemia 02/02/2017  . DDD (degenerative disc disease), cervical 02/02/2017  . Hypothyroid 02/02/2017  . MRSA (methicillin resistant staph aureus) culture positive 02/02/2017  . Nocturnal hypoxia 02/02/2017  . Senile purpura ([place]) 10/03/2016  . Essential hypertension, benign 09/16/2016  . Bilateral carotid artery disease ([place]) 09/16/2016  . 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/18/2016  . 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 History of prostate cancer 12/21/2015  . Left carpal tunnel syndrome 11/11/2015  . Kidney stone on left side 07/05/2015  . Myasthenia gravis ([place]) 05/26/2015  . Radiculitis 01/05/2015  . Long-term use of high-risk medication 10/15/2014  . Persistent cough 09/10/2014  . Pure hypercholesterolemia 07/25/2014  . Spinal stenosis, lumbar region, with neurogenic claudication 02/24/2014  . Lumbosacral stenosis with neurogenic claudication ([place]) 02/24/2014  . COPD (chronic obstructive pulmonary disease) ([place]) 01/22/2014    Lewis Moccasin, PT 01/05/2021, 4:40 PM  [place] MAIN [facility] SERVICES [address] [place], [place], [address] Phone: [phone]   Fax:   [phone]  Name: Francisco [name] MRN: [id number] Date of Birth: [birth year]

## 2021-01-07 ENCOUNTER — Encounter: Payer: Medicare Other | Admitting: Occupational Therapy

## 2021-01-07 ENCOUNTER — Ambulatory Visit: Payer: Medicare Other

## 2021-01-07 ENCOUNTER — Other Ambulatory Visit: Payer: Self-pay

## 2021-01-07 DIAGNOSIS — R269 Unspecified abnormalities of gait and mobility: Secondary | ICD-10-CM

## 2021-01-07 DIAGNOSIS — R2681 Unsteadiness on feet: Secondary | ICD-10-CM | POA: Diagnosis not present

## 2021-01-07 DIAGNOSIS — M6281 Muscle weakness (generalized): Secondary | ICD-10-CM

## 2021-01-07 DIAGNOSIS — R262 Difficulty in walking, not elsewhere classified: Secondary | ICD-10-CM

## 2021-01-07 NOTE — Therapy (Signed)
[place] MAIN [facility] SERVICES [address] [place], [place], [address] Phone: [phone]   Fax:  [phone]  Physical Therapy Treatment  Patient Details  Name: Francisco [name] MRN: [id number] Date of Birth: [birth year] Referring Provider (PT): Lauraine Rinne, PA-C   Encounter Date: 01/07/2021   PT End of Session - 01/07/21 1129    Visit Number 27    Number of Visits 50    Date for PT Re-Evaluation 03/23/21    Authorization Type [employer] Medicare;  2/10 Pn 12/08/20    Authorization Time Period 08/25/20-11/17/20; 10/06/20-12/29/20; 12/29/2020- 03/23/2021    PT Start Time 0932    PT Stop Time 1559    PT Time Calculation (min) 36 min    Equipment Utilized During Treatment Gait belt    Activity Tolerance Patient tolerated treatment well;No increased pain;Patient limited by fatigue    Behavior During Therapy [facility] for tasks assessed/performed           Past Medical History:  Diagnosis Date  . Arthritis    lower left hip  . Atypical angina ([place])   . Bilateral hand numbness    from back surgery  . Bronchitis, chronic ([place])   . Cancer [facility])    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; Merkel cell cancer, and Basal cell cancer (twice; 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  . Carotid stenosis   . CKD (chronic kidney disease) stage 3, GFR 30-59 ml/min (HCC)   . 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 DDD (degenerative disc disease), cervical   . Hypercholesterolemia   . Hypertension   . Hypothyroidism    pt takes Levothyroxine daily  . Lumbosacral spinal stenosis   . Myasthenia gravis, adult form ([place])   . PAD (peripheral artery disease) ([place])   . Shortness of breath    Lung MD- Dr Darlin Coco  . Sleep apnea    do not use CPAP every night    Past Surgical History:  Procedure Laterality Date  . ANTERIOR CERVICAL DECOMP/DISCECTOMY FUSION  07/18/2011   Procedure: ANTERIOR CERVICAL DECOMPRESSION/DISCECTOMY FUSION 2 LEVELS;  Surgeon: Cooper Render Pool;   Location: [place] [facility];  Service: Neurosurgery;  Laterality: N/A;  cervical five-six, cervical six-seven anterior cervical discectomy and fusion  . BACK SURGERY     in [place]     01/2020 Right, 04/2020 Left  . CARDIAC CATHETERIZATION     2005 at [facility], no stents  . CAROTID PTA/STENT INTERVENTION N/A 09/17/2020   Procedure: CAROTID PTA/STENT INTERVENTION;  Surgeon: Algernon Huxley, MD;  Location: [place] CV LAB;  Service: Cardiovascular;  Laterality: N/A;  . CATARACT EXTRACTION W/PHACO Left 01/06/2020   Procedure: CATARACT EXTRACTION PHACO AND INTRAOCULAR LENS PLACEMENT (IOC) ISTENT INJ LEFT 3.81  00:33.3;  Surgeon: Eulogio Bear, MD;  Location: [place];  Service: Ophthalmology;  Laterality: Left;  . CATARACT EXTRACTION W/PHACO Right 02/03/2020   Procedure: CATARACT EXTRACTION PHACO AND INTRAOCULAR LENS PLACEMENT ([place]) RIGHT ISTENT INJ;  Surgeon: Eulogio Bear, MD;  Location: [place];  Service: Ophthalmology;  Laterality: Right;  4.29 0:35.6  . COLONOSCOPY    . HERNIA REPAIR Left    inguinal hernia repair in 1985  . LUMBAR LAMINECTOMY/DECOMPRESSION MICRODISCECTOMY Left 02/24/2014   Procedure: LUMBAR LAMINECTOMY/DECOMPRESSION MICRODISCECTOMY LUMBAR THREE-FOUR, FOUR-FIVE, LEFT FIVE-SACRAL ONE ;  Surgeon: Charlie Pitter, MD;  Location: [place] [facility];  Service: Neurosurgery;  Laterality: Left;  LUMBAR LAMINECTOMY/DECOMPRESSION MICRODISCECTOMY LUMBAR  THREE-FOUR, FOUR-FIVE, LEFT FIVE-SACRAL ONE   . POSTERIOR CERVICAL FUSION/FORAMINOTOMY N/A 08/07/2020   Procedure: C3-6 POSTERIOR FUSION WITH DECOMPRESSION;  Surgeon: Meade Maw, MD;  Location: [facility] ORS;  Service: Neurosurgery;  Laterality: N/A;  . PROSTATECTOMY  8/14   [facility] Dr Mare Ferrari     There were no vitals filed for this visit.   Subjective Assessment - 01/07/21 1524    Subjective Patient reports that he is pretty tired today and not sure what he can do.     Pertinent History Francisco [name] is a 75yoM referred to OPPT neuro s/p myelopathy s/p cervical decompression and fusion. Pt sustained several months of general decline in mobility prior to surgical decompression. Pt went to inpatient rehab after hospitalization. [facility].  Pt underwent C3-6 decompression, fusion on 11/26. PMH: myasthenia gravis, 2012 ACDF 5/6, 6/7; OSA, hypothyroidism, HTN, COPD, multiple back surgeries, 2021 carpal tunnel release, referred to OPOT, but only seen for evaluation. 8/10 OPPT evaluation for LSS c radiating pain into the left hip, only completed evaluation session. September - November progressive decline in leg strength and tolerance to upright activity. Also noted a decline in tool manipulation for ADL (pen and meal utensil). Seen by OT and PT as an outpatient for other potential etiology (Carpal tunnel syndrome, and lumbar spinal stenosis), but eventually identified to be related to cervical myelopathy. Several months prior has no limitations in mobility or function. Pt scheduled for Jan 6th: Carotid endarterectomy c Dr. Leotis Pain. Pt has a long history of lumbar DJD, s/p L5/S1 decompression discectomy x2, L5 nerve root injections. Most recent Lumbar MRI (11/10/18) revealin gof advanced formaminal stenosis of L3/4, L4/5, L5/S1. Pt is has had LLE neurological weakness and pain since 2015 and has been followed by neurosurgery.    Limitations Walking;Lifting;Standing;House hold activities    How long can you sit comfortably? Not limited    How long can you stand comfortably? 3-5 minutes    How long can you walk comfortably? 10 minutes    Patient Stated Goals return to functional baseline status of Summer 2021.    Currently in Pain? No/denies         Interventions  Therex: Patient was instructed today in standing LE strengthening that he can perform at home- Issued handout for the following exercises:  Standing hip march Stand hip abd Stand hip  ext Stand knee flex Stand mini squat Stand calf raises Instructed in and peformed 10 reps BLE - quick seated rest break between each exercise. Education provided throughout session via VC/TC and demonstration to facilitate movement at target joints and correct muscle activation for all testing and exercises performed. Patient exhibited fatigue with all above therex yet no increased pain.    Access Code: [email or web address] URL: [email or web address] Date: 01/07/2021 Prepared by: Sande Brothers  Exercises Standing March with Counter Support - 1 x daily - 3 x weekly - 3 sets - 10 reps - 2 hold Standing Hip Abduction with Counter Support - 1 x daily - 3 x weekly - 3 sets - 10 reps - 2 hold Standing Hip Extension with Counter Support - 1 x daily - 3 x weekly - 3 sets - 10 reps - 2 hold Standing Alternating Knee Flexion - 1 x daily - 3 x weekly - 3 sets - 10 reps - 2 hold Mini Squat with Counter Support - 1 x daily - 3 x weekly - 3 sets - 10 reps - 2 hold Heel rises with counter support -  1 x daily - 3 x weekly - 3 sets - 10 reps - 2 hold   Clinical Impression: Patient was able to participate in Standing LE strengthening exercises fairly well today. He did require brief seated rest break yet able to follow VC and visual demonstration and presents with good understanding of home program as instructed today. Pt will continue to benefit from further skilled therapy to improve balance and LE strength to improve ease with ADLs and QOL.                           PT Education - 01/08/21 1128    Education provided Yes    Education Details specific exercise form with standing LE strengthening    Person(s) Educated Patient    Methods Explanation;Demonstration;Tactile cues;Verbal cues;Handout    Comprehension Verbalized understanding;Returned demonstration;Verbal cues required;Tactile cues required;Need further instruction            PT Short Term Goals - 12/08/20 1357       PT SHORT TERM GOAL #1   Title After 6 weeks patient will be independent in HEP to progress strength, AMB, and balance.    Baseline 2/9: HEP compliant 3/29: HEP compliant    Time 6    Period Weeks    Status Achieved    Target Date 11/17/20      PT SHORT TERM GOAL #2   Title After 6 weeks pt will demonstrate improved 5xSTS hands free chair + airex foam in <20sec    Baseline 01/25: 31s hands free without airex foam 2/9: unable to do on airex 3/29 12.03 seconds no UE support required    Time 6    Period Weeks    Status Achieved    Target Date --             PT Long Term Goals - 12/29/20 1541      PT LONG TERM GOAL #1   Title Patient will reduce timed up and go to <11 seconds to reduce fall risk and demonstrate improved transfer/gait ability.    Baseline 01/25: 30.26s 2/9: 19.55 seconds with RW 3/29: 12.36 seconds with RW.   12/29/2020= 12:15 sec with RW    Time 12    Period Weeks    Status Partially Met    Target Date 03/23/21      PT LONG TERM GOAL #2   Title Patient will demonstrate 6MWT (LRAD) >769f to demonstrate improved capacity to access the community.    Baseline 12/16: 120 ft, 01/25: 165 ft with 1 seated rest break for 2 mins 2/9: 395 ft with RW and one seated rest break 3/29: 520 ft with RW and one seated rest break. 12/29/2020= 550 feet using RW.    Time 12    Period Weeks    Status Partially Met    Target Date 03/23/21      PT LONG TERM GOAL #3   Title Patient will increase 10 meter walk test to >1.074m as to improve gait speed for better community ambulation and to reduce fall risk.    Baseline 01/25: 0.3846m2/9: 0.81 m/s with RW 3/29: 0.91 m/s with RW. 12/29/2020=0.91 m/s with RW.    Time 12    Period Weeks    Status Partially Met    Target Date 03/23/21      PT LONG TERM GOAL #4   Title After 12 weeks pt to demonstrate improved FOTO score >70  Baseline 01/25: 51 2/9: 55.97 % 3/29: 62.9% 12/29/2020= 56%    Time 12    Period Weeks    Status On-going       PT LONG TERM GOAL #5   Title Patient (> 22 years old) will complete five times sit to stand test in <12.5 seconds indicating an increased LE strength and improved balance.    Baseline 01/25: 31s;  2/9: hands on knees 13.09; 13.16sec hands-free 3/29: 12.03 seconds no UE support.    Time 12    Period Weeks    Status Achieved                 Plan - 01/07/21 1134    Clinical Impression Statement Patient was able to participate in Standing LE strengthening exercises fairly well today. He did require brief seated rest break yet able to follow VC and visual demonstration and presents with good understanding of home program as instructed today. Pt will continue to benefit from further skilled therapy to improve balance and LE strength to improve ease with ADLs and QOL    Personal Factors and Comorbidities Age;Comorbidity 2;Past/Current Experience;Time since onset of injury/illness/exacerbation    Comorbidities COPD, History of Cancer, myasthenia gravis, chronic lumbar surgical history.    Examination-Activity Limitations Lift;Squat;Bend;Stairs;Stand;Transfers;Insurance claims handler;Bathing;Hygiene/Grooming;Dressing;Toileting    Examination-Participation Restrictions Yard Work;Church;Cleaning;Driving    Stability/Clinical Decision Making Stable/Uncomplicated    Rehab Potential Good    Clinical Impairments Affecting Rehab Potential PMH    PT Frequency 2x / week    PT Duration 12 weeks    PT Treatment/Interventions ADLs/Self Care Home Management;Electrical Stimulation;Moist Heat;Traction;Ultrasound;Gait training;Stair training;Functional mobility training;Therapeutic activities;Therapeutic exercise;Balance training;Neuromuscular re-education;Patient/family education;Manual techniques;Passive range of motion;Dry needling;Joint Manipulations;Cryotherapy    PT Next Visit Plan balance, strength; progress standing balance, dynamic balance    PT Home Exercise Plan no changes     Consulted and Agree with Plan of Care Patient           Patient will benefit from skilled therapeutic intervention in order to improve the following deficits and impairments:  Abnormal gait,Decreased balance,Decreased mobility,Decreased endurance,Difficulty walking,Hypomobility,Impaired sensation,Decreased range of motion,Impaired perceived functional ability,Decreased activity tolerance,Decreased coordination,Decreased strength,Impaired flexibility,Postural dysfunction,Pain  Visit Diagnosis: Abnormality of gait and mobility  Difficulty in walking, not elsewhere classified  Muscle weakness (generalized)     Problem List Patient Active Problem List   Diagnosis Date Noted  . History of decompression of median nerve 01/01/2021  . Paroxysmal atrial fibrillation ([place]) 10/06/2020  . Carotid stenosis, right 09/17/2020  . S/P cervical spinal fusion   . Leukocytosis   . Essential hypertension   . Anemia of chronic disease   . Postoperative pain   . Neuropathic pain   . Cervical myelopathy ([place]) 08/07/2020  . Preop cardiovascular exam 07/17/2020  . SOB (shortness of breath) on exertion 07/17/2020  . Leg weakness, bilateral 07/13/2020  . Aortic atherosclerosis ([place]) 07/09/2020  . Body mass index (BMI) 34.0-34.9, adult 12/25/2019  . Myalgia 10/30/2019  . Lumbar post-laminectomy syndrome 10/24/2019  . PAD (peripheral artery disease) ([place]) 06/06/2019  . CKD (chronic kidney disease) stage 3, GFR 30-59 ml/min (HCC) 04/29/2019  . B12 deficiency 01/23/2019  . Left arm numbness 02/28/2018  . Neck pain 02/28/2018  . Anemia 02/02/2017  . DDD (degenerative disc disease), cervical 02/02/2017  . Hypothyroid 02/02/2017  . MRSA (methicillin resistant staph aureus) culture positive 02/02/2017  . Nocturnal hypoxia 02/02/2017  . Senile purpura ([place]) 10/03/2016  . Essential hypertension, benign 09/16/2016  . Bilateral carotid artery disease ([place])  09/16/2016  . Facet arthritis of lumbar region  03/18/2016  . 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 History of prostate cancer 12/21/2015  . Left carpal tunnel syndrome 11/11/2015  . Kidney stone on left side 07/05/2015  . Myasthenia gravis ([place]) 05/26/2015  . Radiculitis 01/05/2015  . Long-term use of high-risk medication 10/15/2014  . Persistent cough 09/10/2014  . Pure hypercholesterolemia 07/25/2014  . Spinal stenosis, lumbar region, with neurogenic claudication 02/24/2014  . Lumbosacral stenosis with neurogenic claudication ([place]) 02/24/2014  . COPD (chronic obstructive pulmonary disease) ([place]) 01/22/2014    Lewis Moccasin, PT 01/08/2021, 11:42 AM  [place] MAIN [facility] SERVICES [address] [place], [place], [address] Phone: [phone]   Fax:  [phone]  Name: Francisco [name] MRN: [id number] Date of Birth: December 06, 1945

## 2021-01-12 ENCOUNTER — Encounter: Payer: Medicare Other | Admitting: Occupational Therapy

## 2021-01-12 ENCOUNTER — Other Ambulatory Visit: Payer: Self-pay

## 2021-01-12 ENCOUNTER — Ambulatory Visit: Payer: Medicare Other | Attending: Internal Medicine

## 2021-01-12 DIAGNOSIS — R2689 Other abnormalities of gait and mobility: Secondary | ICD-10-CM | POA: Diagnosis present

## 2021-01-12 DIAGNOSIS — R278 Other lack of coordination: Secondary | ICD-10-CM | POA: Insufficient documentation

## 2021-01-12 DIAGNOSIS — R2681 Unsteadiness on feet: Secondary | ICD-10-CM | POA: Insufficient documentation

## 2021-01-12 DIAGNOSIS — R269 Unspecified abnormalities of gait and mobility: Secondary | ICD-10-CM | POA: Insufficient documentation

## 2021-01-12 DIAGNOSIS — R262 Difficulty in walking, not elsewhere classified: Secondary | ICD-10-CM

## 2021-01-12 DIAGNOSIS — M6281 Muscle weakness (generalized): Secondary | ICD-10-CM | POA: Diagnosis present

## 2021-01-12 NOTE — Therapy (Signed)
[place] MAIN [facility] SERVICES [address] [place], [place], [address] Phone: [phone]   Fax:  [phone]  Physical Therapy Treatment  Patient Details  Name: Francisco [name] MRN: [id number] Date of Birth: [birth year] Referring Provider (PT): Lauraine Rinne, PA-C   Encounter Date: 01/12/2021   PT End of Session - 01/12/21 1726    Visit Number 28    Number of Visits 50    Date for PT Re-Evaluation 03/23/21    Authorization Type [employer] Medicare;  8/10 Pn 12/08/20    Authorization Time Period 08/25/20-11/17/20; 10/06/20-12/29/20; 12/29/2020- 03/23/2021    PT Start Time 1526    PT Stop Time 1559    PT Time Calculation (min) 33 min    Equipment Utilized During Treatment Gait belt    Activity Tolerance Patient tolerated treatment well;No increased pain;Patient limited by fatigue    Behavior During Therapy [facility] for tasks assessed/performed           Past Medical History:  Diagnosis Date  . Arthritis    lower left hip  . Atypical angina ([place])   . Bilateral hand numbness    from back surgery  . Bronchitis, chronic ([place])   . Cancer [facility])    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; Merkel cell cancer, and Basal cell cancer (twice; 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  . Carotid stenosis   . CKD (chronic kidney disease) stage 3, GFR 30-59 ml/min (HCC)   . 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 DDD (degenerative disc disease), cervical   . Hypercholesterolemia   . Hypertension   . Hypothyroidism    pt takes Levothyroxine daily  . Lumbosacral spinal stenosis   . Myasthenia gravis, adult form ([place])   . PAD (peripheral artery disease) ([place])   . Shortness of breath    Lung MD- Dr Darlin Coco  . Sleep apnea    do not use CPAP every night    Past Surgical History:  Procedure Laterality Date  . ANTERIOR CERVICAL DECOMP/DISCECTOMY FUSION  07/18/2011   Procedure: ANTERIOR CERVICAL DECOMPRESSION/DISCECTOMY FUSION 2 LEVELS;  Surgeon: Cooper Render Pool;   Location: [place] NEURO ORS;  Service: Neurosurgery;  Laterality: N/A;  cervical five-six, cervical six-seven anterior cervical discectomy and fusion  . BACK SURGERY     in [place]     01/2020 Right, 04/2020 Left  . CARDIAC CATHETERIZATION     2005 at [facility], no stents  . CAROTID PTA/STENT INTERVENTION N/A 09/17/2020   Procedure: CAROTID PTA/STENT INTERVENTION;  Surgeon: Algernon Huxley, MD;  Location: [place] CV LAB;  Service: Cardiovascular;  Laterality: N/A;  . CATARACT EXTRACTION W/PHACO Left 01/06/2020   Procedure: CATARACT EXTRACTION PHACO AND INTRAOCULAR LENS PLACEMENT (IOC) ISTENT INJ LEFT 3.81  00:33.3;  Surgeon: Eulogio Bear, MD;  Location: [place];  Service: Ophthalmology;  Laterality: Left;  . CATARACT EXTRACTION W/PHACO Right 02/03/2020   Procedure: CATARACT EXTRACTION PHACO AND INTRAOCULAR LENS PLACEMENT ([place]) RIGHT ISTENT INJ;  Surgeon: Eulogio Bear, MD;  Location: [place];  Service: Ophthalmology;  Laterality: Right;  4.29 0:35.6  . COLONOSCOPY    . HERNIA REPAIR Left    inguinal hernia repair in 1985  . LUMBAR LAMINECTOMY/DECOMPRESSION MICRODISCECTOMY Left 02/24/2014   Procedure: LUMBAR LAMINECTOMY/DECOMPRESSION MICRODISCECTOMY LUMBAR THREE-FOUR, FOUR-FIVE, LEFT FIVE-SACRAL ONE ;  Surgeon: Charlie Pitter, MD;  Location: [place] NEURO ORS;  Service: Neurosurgery;  Laterality: Left;  LUMBAR LAMINECTOMY/DECOMPRESSION MICRODISCECTOMY LUMBAR  THREE-FOUR, FOUR-FIVE, LEFT FIVE-SACRAL ONE   . POSTERIOR CERVICAL FUSION/FORAMINOTOMY N/A 08/07/2020   Procedure: C3-6 POSTERIOR FUSION WITH DECOMPRESSION;  Surgeon: Meade Maw, MD;  Location: [facility] ORS;  Service: Neurosurgery;  Laterality: N/A;  . PROSTATECTOMY  8/14   [facility] Dr Mare Ferrari     There were no vitals filed for this visit.   Subjective Assessment - 01/12/21 1724    Subjective Patient arrived late for PT session. No falls or LOB since last session.     Pertinent History Francisco [name] is a 59yoM referred to OPPT neuro s/p myelopathy s/p cervical decompression and fusion. Pt sustained several months of general decline in mobility prior to surgical decompression. Pt went to inpatient rehab after hospitalization. [facility].  Pt underwent C3-6 decompression, fusion on 11/26. PMH: myasthenia gravis, 2012 ACDF 5/6, 6/7; OSA, hypothyroidism, HTN, COPD, multiple back surgeries, 2021 carpal tunnel release, referred to OPOT, but only seen for evaluation. 8/10 OPPT evaluation for LSS c radiating pain into the left hip, only completed evaluation session. September - November progressive decline in leg strength and tolerance to upright activity. Also noted a decline in tool manipulation for ADL (pen and meal utensil). Seen by OT and PT as an outpatient for other potential etiology (Carpal tunnel syndrome, and lumbar spinal stenosis), but eventually identified to be related to cervical myelopathy. Several months prior has no limitations in mobility or function. Pt scheduled for Jan 6th: Carotid endarterectomy c Dr. Leotis Pain. Pt has a long history of lumbar DJD, s/p L5/S1 decompression discectomy x2, L5 nerve root injections. Most recent Lumbar MRI (11/10/18) revealin gof advanced formaminal stenosis of L3/4, L4/5, L5/S1. Pt is has had LLE neurological weakness and pain since 2015 and has been followed by neurosurgery.    Limitations Walking;Lifting;Standing;House hold activities    How long can you sit comfortably? Not limited    How long can you stand comfortably? 3-5 minutes    How long can you walk comfortably? 10 minutes    Patient Stated Goals return to functional baseline status of Summer 2021.    Currently in Pain? No/denies                  Neuro Re-ed: In // bars orange hurdle step over and back 10x each LE; BUE support airex pad: tap 3 hedgehogs based on PT guidance, SUE support 8x each LE; very challenging airex pad:  throw balls at target for pertubations x 15 trials Standing ball kicks 10x each LE, UE support required      Therex:   Lateral stepping 4x length of // bars Modified walking lunges with decreasing UE support 4x length of // bars  toy solders 2x length of // bars with finger tip support; pain increase in hips    Education provided throughout session in the form of demonstration, VC/TC to facilitate movement at target joints and correct muscle activation with exercises.   Patient's session limited by late arrival. Patient verbalized understanding of limitations due to time constraints. LLE stabilization is challenged with decreased UE support on unstable surfaces. Ankle righting reactions are present but decrease with dual task and fatigue. Circumduction of LLE is noted more than R with object negotiation however patient is able to correct with focus and external cueing. Pt will continue to benefit from further skilled therapy to improve balance and LE strength to improve ease with ADLs and QOL  PT Education - 01/12/21 1725    Education provided Yes    Education Details exercise technique, body mechanics    Person(s) Educated Patient    Methods Explanation;Demonstration;Tactile cues;Verbal cues    Comprehension Verbalized understanding;Returned demonstration;Verbal cues required;Tactile cues required            PT Short Term Goals - 12/08/20 1357      PT SHORT TERM GOAL #1   Title After 6 weeks patient will be independent in HEP to progress strength, AMB, and balance.    Baseline 2/9: HEP compliant 3/29: HEP compliant    Time 6    Period Weeks    Status Achieved    Target Date 11/17/20      PT SHORT TERM GOAL #2   Title After 6 weeks pt will demonstrate improved 5xSTS hands free chair + airex foam in <20sec    Baseline 01/25: 31s hands free without airex foam 2/9: unable to do on airex 3/29 12.03 seconds no UE support required    Time 6    Period  Weeks    Status Achieved    Target Date --             PT Long Term Goals - 12/29/20 1541      PT LONG TERM GOAL #1   Title Patient will reduce timed up and go to <11 seconds to reduce fall risk and demonstrate improved transfer/gait ability.    Baseline 01/25: 30.26s 2/9: 19.55 seconds with RW 3/29: 12.36 seconds with RW.   12/29/2020= 12:15 sec with RW    Time 12    Period Weeks    Status Partially Met    Target Date 03/23/21      PT LONG TERM GOAL #2   Title Patient will demonstrate 6MWT (LRAD) >738f to demonstrate improved capacity to access the community.    Baseline 12/16: 120 ft, 01/25: 165 ft with 1 seated rest break for 2 mins 2/9: 395 ft with RW and one seated rest break 3/29: 520 ft with RW and one seated rest break. 12/29/2020= 550 feet using RW.    Time 12    Period Weeks    Status Partially Met    Target Date 03/23/21      PT LONG TERM GOAL #3   Title Patient will increase 10 meter walk test to >1.098m as to improve gait speed for better community ambulation and to reduce fall risk.    Baseline 01/25: 0.387m2/9: 0.81 m/s with RW 3/29: 0.91 m/s with RW. 12/29/2020=0.91 m/s with RW.    Time 12    Period Weeks    Status Partially Met    Target Date 03/23/21      PT LONG TERM GOAL #4   Title After 12 weeks pt to demonstrate improved FOTO score >70    Baseline 01/25: 51 2/9: 55.97 % 3/29: 62.9% 12/29/2020= 56%    Time 12    Period Weeks    Status On-going      PT LONG TERM GOAL #5   Title Patient (> 60 26ars old) will complete five times sit to stand test in <12.5 seconds indicating an increased LE strength and improved balance.    Baseline 01/25: 31s;  2/9: hands on knees 13.09; 13.16sec hands-free 3/29: 12.03 seconds no UE support.    Time 12    Period Weeks    Status Achieved  Plan - 01/12/21 1728    Clinical Impression Statement Patient's session limited by late arrival. Patient verbalized understanding of limitations due to time  constraints. LLE stabilization is challenged with decreased UE support on unstable surfaces. Ankle righting reactions are present but decrease with dual task and fatigue. Circumduction of LLE is noted more than R with object negotiation however patient is able to correct with focus and external cueing. Pt will continue to benefit from further skilled therapy to improve balance and LE strength to improve ease with ADLs and QOL    Personal Factors and Comorbidities Age;Comorbidity 2;Past/Current Experience;Time since onset of injury/illness/exacerbation    Comorbidities COPD, History of Cancer, myasthenia gravis, chronic lumbar surgical history.    Examination-Activity Limitations Lift;Squat;Bend;Stairs;Stand;Transfers;Insurance claims handler;Bathing;Hygiene/Grooming;Dressing;Toileting    Examination-Participation Restrictions Yard Work;Church;Cleaning;Driving    Stability/Clinical Decision Making Stable/Uncomplicated    Rehab Potential Good    Clinical Impairments Affecting Rehab Potential PMH    PT Frequency 2x / week    PT Duration 12 weeks    PT Treatment/Interventions ADLs/Self Care Home Management;Electrical Stimulation;Moist Heat;Traction;Ultrasound;Gait training;Stair training;Functional mobility training;Therapeutic activities;Therapeutic exercise;Balance training;Neuromuscular re-education;Patient/family education;Manual techniques;Passive range of motion;Dry needling;Joint Manipulations;Cryotherapy    PT Next Visit Plan balance, strength; progress standing balance, dynamic balance    PT Home Exercise Plan no changes    Consulted and Agree with Plan of Care Patient           Patient will benefit from skilled therapeutic intervention in order to improve the following deficits and impairments:  Abnormal gait,Decreased balance,Decreased mobility,Decreased endurance,Difficulty walking,Hypomobility,Impaired sensation,Decreased range of motion,Impaired perceived functional  ability,Decreased activity tolerance,Decreased coordination,Decreased strength,Impaired flexibility,Postural dysfunction,Pain  Visit Diagnosis: Abnormality of gait and mobility  Difficulty in walking, not elsewhere classified  Muscle weakness (generalized)  Unsteadiness on feet     Problem List Patient Active Problem List   Diagnosis Date Noted  . History of decompression of median nerve 01/01/2021  . Paroxysmal atrial fibrillation ([place]) 10/06/2020  . Carotid stenosis, right 09/17/2020  . S/P cervical spinal fusion   . Leukocytosis   . Essential hypertension   . Anemia of chronic disease   . Postoperative pain   . Neuropathic pain   . Cervical myelopathy ([place]) 08/07/2020  . Preop cardiovascular exam 07/17/2020  . SOB (shortness of breath) on exertion 07/17/2020  . Leg weakness, bilateral 07/13/2020  . Aortic atherosclerosis ([place]) 07/09/2020  . Body mass index (BMI) 34.0-34.9, adult 12/25/2019  . Myalgia 10/30/2019  . Lumbar post-laminectomy syndrome 10/24/2019  . PAD (peripheral artery disease) ([place]) 06/06/2019  . CKD (chronic kidney disease) stage 3, GFR 30-59 ml/min (HCC) 04/29/2019  . B12 deficiency 01/23/2019  . Left arm numbness 02/28/2018  . Neck pain 02/28/2018  . Anemia 02/02/2017  . DDD (degenerative disc disease), cervical 02/02/2017  . Hypothyroid 02/02/2017  . MRSA (methicillin resistant staph aureus) culture positive 02/02/2017  . Nocturnal hypoxia 02/02/2017  . Senile purpura ([place]) 10/03/2016  . Essential hypertension, benign 09/16/2016  . Bilateral carotid artery disease ([place]) 09/16/2016  . 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/18/2016  . 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 History of prostate cancer 12/21/2015  . Left carpal tunnel syndrome 11/11/2015  . Kidney stone on left side 07/05/2015  . Myasthenia gravis ([place]) 05/26/2015  . Radiculitis 01/05/2015  . Long-term use of high-risk medication 10/15/2014  . Persistent cough 09/10/2014  .  Pure hypercholesterolemia 07/25/2014  . Spinal stenosis, lumbar region, with neurogenic claudication 02/24/2014  . Lumbosacral stenosis with neurogenic claudication ([place]) 02/24/2014  .  COPD (chronic obstructive pulmonary disease) ([place]) 01/22/2014   Janna Arch, PT, DPT   01/12/2021, 5:29 PM  [place] MAIN [facility] SERVICES [address] [place], [place], [address] Phone: [phone]   Fax:  [phone]  Name: Francisco [name] MRN: [id number] Date of Birth: [birth year]

## 2021-01-13 DIAGNOSIS — D6869 Other thrombophilia: Secondary | ICD-10-CM | POA: Insufficient documentation

## 2021-01-14 ENCOUNTER — Encounter: Payer: Medicare Other | Admitting: Occupational Therapy

## 2021-01-14 ENCOUNTER — Ambulatory Visit: Payer: Medicare Other

## 2021-01-19 ENCOUNTER — Other Ambulatory Visit: Payer: Self-pay

## 2021-01-19 ENCOUNTER — Ambulatory Visit: Payer: Medicare Other

## 2021-01-19 ENCOUNTER — Encounter: Payer: Medicare Other | Admitting: Occupational Therapy

## 2021-01-19 VITALS — BP 164/68 | HR 68

## 2021-01-19 DIAGNOSIS — R2689 Other abnormalities of gait and mobility: Secondary | ICD-10-CM

## 2021-01-19 DIAGNOSIS — M6281 Muscle weakness (generalized): Secondary | ICD-10-CM

## 2021-01-19 DIAGNOSIS — R269 Unspecified abnormalities of gait and mobility: Secondary | ICD-10-CM | POA: Diagnosis not present

## 2021-01-19 DIAGNOSIS — R2681 Unsteadiness on feet: Secondary | ICD-10-CM

## 2021-01-19 NOTE — Therapy (Signed)
[place] MAIN [facility] SERVICES [address] [place], [place], [address] Phone: [phone]   Fax:  [phone]  Physical Therapy Treatment  Patient Details  Name: Francisco [name] MRN: [id number] Date of Birth: [birth year] Referring Provider (PT): Lauraine Rinne, PA-C   Encounter Date: 01/19/2021   PT End of Session - 01/19/21 1535    Visit Number 29    Number of Visits 50    Date for PT Re-Evaluation 03/23/21    Authorization Type [employer] Medicare;  8/10 Pn 12/08/20    Authorization Time Period 08/25/20-11/17/20; 10/06/20-12/29/20; 12/29/2020- 03/23/2021    PT Start Time 1438    PT Stop Time 1521    PT Time Calculation (min) 43 min    Equipment Utilized During Treatment Gait belt    Activity Tolerance Patient tolerated treatment well;No increased pain;Patient limited by fatigue    Behavior During Therapy [facility] for tasks assessed/performed           Past Medical History:  Diagnosis Date  . Arthritis    lower left hip  . Atypical angina ([place])   . Bilateral hand numbness    from back surgery  . Bronchitis, chronic ([place])   . Cancer [facility])    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; Merkel cell cancer, and Basal cell cancer (twice; 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  . Carotid stenosis   . CKD (chronic kidney disease) stage 3, GFR 30-59 ml/min (HCC)   . 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 DDD (degenerative disc disease), cervical   . Hypercholesterolemia   . Hypertension   . Hypothyroidism    pt takes Levothyroxine daily  . Lumbosacral spinal stenosis   . Myasthenia gravis, adult form ([place])   . PAD (peripheral artery disease) ([place])   . Shortness of breath    Lung MD- Dr Darlin Coco  . Sleep apnea    do not use CPAP every night    Past Surgical History:  Procedure Laterality Date  . ANTERIOR CERVICAL DECOMP/DISCECTOMY FUSION  07/18/2011   Procedure: ANTERIOR CERVICAL DECOMPRESSION/DISCECTOMY FUSION 2 LEVELS;  Surgeon: Cooper Render Pool;   Location: [place] NEURO ORS;  Service: Neurosurgery;  Laterality: N/A;  cervical five-six, cervical six-seven anterior cervical discectomy and fusion  . BACK SURGERY     in [place]     01/2020 Right, 04/2020 Left  . CARDIAC CATHETERIZATION     2005 at [facility], no stents  . CAROTID PTA/STENT INTERVENTION N/A 09/17/2020   Procedure: CAROTID PTA/STENT INTERVENTION;  Surgeon: Algernon Huxley, MD;  Location: [place] CV LAB;  Service: Cardiovascular;  Laterality: N/A;  . CATARACT EXTRACTION W/PHACO Left 01/06/2020   Procedure: CATARACT EXTRACTION PHACO AND INTRAOCULAR LENS PLACEMENT (IOC) ISTENT INJ LEFT 3.81  00:33.3;  Surgeon: Eulogio Bear, MD;  Location: [place];  Service: Ophthalmology;  Laterality: Left;  . CATARACT EXTRACTION W/PHACO Right 02/03/2020   Procedure: CATARACT EXTRACTION PHACO AND INTRAOCULAR LENS PLACEMENT ([place]) RIGHT ISTENT INJ;  Surgeon: Eulogio Bear, MD;  Location: [place];  Service: Ophthalmology;  Laterality: Right;  4.29 0:35.6  . COLONOSCOPY    . HERNIA REPAIR Left    inguinal hernia repair in 1985  . LUMBAR LAMINECTOMY/DECOMPRESSION MICRODISCECTOMY Left 02/24/2014   Procedure: LUMBAR LAMINECTOMY/DECOMPRESSION MICRODISCECTOMY LUMBAR THREE-FOUR, FOUR-FIVE, LEFT FIVE-SACRAL ONE ;  Surgeon: Charlie Pitter, MD;  Location: [place] NEURO ORS;  Service: Neurosurgery;  Laterality: Left;  LUMBAR LAMINECTOMY/DECOMPRESSION MICRODISCECTOMY LUMBAR  THREE-FOUR, FOUR-FIVE, LEFT FIVE-SACRAL ONE   . POSTERIOR CERVICAL FUSION/FORAMINOTOMY N/A 08/07/2020   Procedure: C3-6 POSTERIOR FUSION WITH DECOMPRESSION;  Surgeon: Meade Maw, MD;  Location: [facility] ORS;  Service: Neurosurgery;  Laterality: N/A;  . PROSTATECTOMY  8/14   [facility] Dr Mare Ferrari     Vitals:   01/19/21 1539  BP: (!) 164/68  Pulse: 68     Subjective Assessment - 01/19/21 1535    Subjective Pt says "as long as I've got that walker to lean on I don't have  any pain." He reports when he doesn't weight-bear through UEs he has increased LBP that reaches 7-8/10. He reports no pain currently. Pt reports he feels is gait has improved.    Pertinent History Francisco [name] is a 78yoM referred to OPPT neuro s/p myelopathy s/p cervical decompression and fusion. Pt sustained several months of general decline in mobility prior to surgical decompression. Pt went to inpatient rehab after hospitalization. [facility].  Pt underwent C3-6 decompression, fusion on 11/26. PMH: myasthenia gravis, 2012 ACDF 5/6, 6/7; OSA, hypothyroidism, HTN, COPD, multiple back surgeries, 2021 carpal tunnel release, referred to OPOT, but only seen for evaluation. 8/10 OPPT evaluation for LSS c radiating pain into the left hip, only completed evaluation session. September - November progressive decline in leg strength and tolerance to upright activity. Also noted a decline in tool manipulation for ADL (pen and meal utensil). Seen by OT and PT as an outpatient for other potential etiology (Carpal tunnel syndrome, and lumbar spinal stenosis), but eventually identified to be related to cervical myelopathy. Several months prior has no limitations in mobility or function. Pt scheduled for Jan 6th: Carotid endarterectomy c Dr. Leotis Pain. Pt has a long history of lumbar DJD, s/p L5/S1 decompression discectomy x2, L5 nerve root injections. Most recent Lumbar MRI (11/10/18) revealin gof advanced formaminal stenosis of L3/4, L4/5, L5/S1. Pt is has had LLE neurological weakness and pain since 2015 and has been followed by neurosurgery.    Limitations Walking;Lifting;Standing;House hold activities    How long can you sit comfortably? Not limited    How long can you stand comfortably? 3-5 minutes    How long can you walk comfortably? 10 minutes    Patient Stated Goals return to functional baseline status of Summer 2021.    Currently in Pain? No/denies          TREATMENT  Neuro  Re-Education -   At support surface: Stepping over orange hurdle laterally - x multiple reps each side.  With use of BUE support to UUE support. SPO2 95-96%, HR 77 bpm  Stepping forward/backward over orange hurdle - 3x10 alternating LEs as lead LE. Use of SUE support to three-finger support on bar. SPO2% 93-94%, HR 80-88 bpm  Toe taps on (3) hedgehogs based on PT guidance - 10x each LE for 3 rounds. With SUE support on bar; very challenging SPO2% 96%, HR 76 bpm   Therex -   Seated BP: LUE 164/68 mmHg, HR 68 bpm  Seated marches - no AW 1x20, with 2.5#  AW - 1x20; pt rates "medium" SPO2 94-95% HR 76 BPM  Seated LAQ with 2.5# AW - 1x8, 1x10; pt rates "medium"  Seated heel raises with 2.5# AW - 3x10; pt rates "medium," limited ROM on LLE   Seated dorsiflexion no AW - 2x20; increased use of demo/VC for technique.   Education provided throughout session in the form of demonstration, VC/TC to facilitate movement at target joints and correct  muscle activation with exercises.Pt exhibits good technique after cuing.    Assessment: Session slightly limited due to pt reports of fatigue with standing balance exercises, where pt required multiple rest breaks. Vitals were monitored throughout and pt SPO2% ranged from 93-96%. PT reinforced breathing technique. Pt performed seated therex following majority of standing balance exercises with less difficulty/need for rest breaks. The pt will benefit from further skilled therapy to improve BLE strength and balance to increase ease with all functional mobility.    PT Short Term Goals - 12/08/20 1357      PT SHORT TERM GOAL #1   Title After 6 weeks patient will be independent in HEP to progress strength, AMB, and balance.    Baseline 2/9: HEP compliant 3/29: HEP compliant    Time 6    Period Weeks    Status Achieved    Target Date 11/17/20      PT SHORT TERM GOAL #2   Title After 6 weeks pt will demonstrate improved 5xSTS hands free chair + airex  foam in <20sec    Baseline 01/25: 31s hands free without airex foam 2/9: unable to do on airex 3/29 12.03 seconds no UE support required    Time 6    Period Weeks    Status Achieved    Target Date --             PT Long Term Goals - 12/29/20 1541      PT LONG TERM GOAL #1   Title Patient will reduce timed up and go to <11 seconds to reduce fall risk and demonstrate improved transfer/gait ability.    Baseline 01/25: 30.26s 2/9: 19.55 seconds with RW 3/29: 12.36 seconds with RW.   12/29/2020= 12:15 sec with RW    Time 12    Period Weeks    Status Partially Met    Target Date 03/23/21      PT LONG TERM GOAL #2   Title Patient will demonstrate 6MWT (LRAD) >77f to demonstrate improved capacity to access the community.    Baseline 12/16: 120 ft, 01/25: 165 ft with 1 seated rest break for 2 mins 2/9: 395 ft with RW and one seated rest break 3/29: 520 ft with RW and one seated rest break. 12/29/2020= 550 feet using RW.    Time 12    Period Weeks    Status Partially Met    Target Date 03/23/21      PT LONG TERM GOAL #3   Title Patient will increase 10 meter walk test to >1.095m as to improve gait speed for better community ambulation and to reduce fall risk.    Baseline 01/25: 0.3860m2/9: 0.81 m/s with RW 3/29: 0.91 m/s with RW. 12/29/2020=0.91 m/s with RW.    Time 12    Period Weeks    Status Partially Met    Target Date 03/23/21      PT LONG TERM GOAL #4   Title After 12 weeks pt to demonstrate improved FOTO score >70    Baseline 01/25: 51 2/9: 55.97 % 3/29: 62.9% 12/29/2020= 56%    Time 12    Period Weeks    Status On-going      PT LONG TERM GOAL #5   Title Patient (> 60 29ars old) will complete five times sit to stand test in <12.5 seconds indicating an increased LE strength and improved balance.    Baseline 01/25: 31s;  2/9: hands on knees 13.09; 13.16sec hands-free 3/29: 12.03 seconds no UE  support.    Time 12    Period Weeks    Status Achieved                  Plan - 01/19/21 1547    Clinical Impression Statement Session slightly limited due to pt reports of fatigue with standing balance exercises, where pt required multiple rest breaks. Vitals were monitored throughout and pt SPO2% ranged from 93-96%. PT reinforced breathing technique. Pt performed seated therex following majority of standing balance exercises with less difficulty/need for rest breaks. The pt will benefit from further skilled therapy to improve BLE strength and balance to increase ease with all functional mobility.    Personal Factors and Comorbidities Age;Comorbidity 2;Past/Current Experience;Time since onset of injury/illness/exacerbation    Comorbidities COPD, History of Cancer, myasthenia gravis, chronic lumbar surgical history.    Examination-Activity Limitations Lift;Squat;Bend;Stairs;Stand;Transfers;Insurance claims handler;Bathing;Hygiene/Grooming;Dressing;Toileting    Examination-Participation Restrictions Yard Work;Church;Cleaning;Driving    Stability/Clinical Decision Making Stable/Uncomplicated    Rehab Potential Good    Clinical Impairments Affecting Rehab Potential PMH    PT Frequency 2x / week    PT Duration 12 weeks    PT Treatment/Interventions ADLs/Self Care Home Management;Electrical Stimulation;Moist Heat;Traction;Ultrasound;Gait training;Stair training;Functional mobility training;Therapeutic activities;Therapeutic exercise;Balance training;Neuromuscular re-education;Patient/family education;Manual techniques;Passive range of motion;Dry needling;Joint Manipulations;Cryotherapy    PT Next Visit Plan balance, strength; progress standing balance, dynamic balance, continue POC as previously indicated    PT Home Exercise Plan no changes    Consulted and Agree with Plan of Care Patient           Patient will benefit from skilled therapeutic intervention in order to improve the following deficits and impairments:  Abnormal gait,Decreased balance,Decreased  mobility,Decreased endurance,Difficulty walking,Hypomobility,Impaired sensation,Decreased range of motion,Impaired perceived functional ability,Decreased activity tolerance,Decreased coordination,Decreased strength,Impaired flexibility,Postural dysfunction,Pain  Visit Diagnosis: Muscle weakness (generalized)  Other abnormalities of gait and mobility  Unsteadiness on feet     Problem List Patient Active Problem List   Diagnosis Date Noted  . History of decompression of median nerve 01/01/2021  . Paroxysmal atrial fibrillation ([place]) 10/06/2020  . Carotid stenosis, right 09/17/2020  . S/P cervical spinal fusion   . Leukocytosis   . Essential hypertension   . Anemia of chronic disease   . Postoperative pain   . Neuropathic pain   . Cervical myelopathy ([place]) 08/07/2020  . Preop cardiovascular exam 07/17/2020  . SOB (shortness of breath) on exertion 07/17/2020  . Leg weakness, bilateral 07/13/2020  . Aortic atherosclerosis ([place]) 07/09/2020  . Body mass index (BMI) 34.0-34.9, adult 12/25/2019  . Myalgia 10/30/2019  . Lumbar post-laminectomy syndrome 10/24/2019  . PAD (peripheral artery disease) ([place]) 06/06/2019  . CKD (chronic kidney disease) stage 3, GFR 30-59 ml/min (HCC) 04/29/2019  . B12 deficiency 01/23/2019  . Left arm numbness 02/28/2018  . Neck pain 02/28/2018  . Anemia 02/02/2017  . DDD (degenerative disc disease), cervical 02/02/2017  . Hypothyroid 02/02/2017  . MRSA (methicillin resistant staph aureus) culture positive 02/02/2017  . Nocturnal hypoxia 02/02/2017  . Senile purpura ([place]) 10/03/2016  . Essential hypertension, benign 09/16/2016  . Bilateral carotid artery disease ([place]) 09/16/2016  . 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/18/2016  . 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 History of prostate cancer 12/21/2015  . Left carpal tunnel syndrome 11/11/2015  . Kidney stone on left side 07/05/2015  . Myasthenia gravis ([place]) 05/26/2015  . Radiculitis 01/05/2015   . Long-term use of high-risk medication 10/15/2014  . Persistent cough 09/10/2014  . Pure hypercholesterolemia 07/25/2014  .  Spinal stenosis, lumbar region, with neurogenic claudication 02/24/2014  . Lumbosacral stenosis with neurogenic claudication ([place]) 02/24/2014  . COPD (chronic obstructive pulmonary disease) ([place]) 01/22/2014   Ricard Dillon PT, DPT 01/19/2021, 3:50 PM  [place] MAIN [facility] SERVICES [address] [place], [place], [address] Phone: [phone]   Fax:  [phone]  Name: Francisco [name] MRN: [id number] Date of Birth: [birth year]

## 2021-01-21 ENCOUNTER — Ambulatory Visit: Payer: Medicare Other

## 2021-01-22 ENCOUNTER — Emergency Department: Payer: Medicare Other

## 2021-01-22 ENCOUNTER — Other Ambulatory Visit: Payer: Self-pay

## 2021-01-22 ENCOUNTER — Emergency Department
Admission: EM | Admit: 2021-01-22 | Discharge: 2021-01-22 | Disposition: A | Discharge status: Home / Self Care | Payer: Medicare Other | Attending: Emergency Medicine | Admitting: Emergency Medicine

## 2021-01-22 DIAGNOSIS — J449 Chronic obstructive pulmonary disease, unspecified: Secondary | ICD-10-CM | POA: Insufficient documentation

## 2021-01-22 DIAGNOSIS — W01198A Fall on same level from slipping, tripping and stumbling with subsequent striking against other object, initial encounter: Secondary | ICD-10-CM | POA: Diagnosis not present

## 2021-01-22 DIAGNOSIS — S51011A Laceration without foreign body of right elbow, initial encounter: Secondary | ICD-10-CM | POA: Diagnosis not present

## 2021-01-22 DIAGNOSIS — Z7982 Long term (current) use of aspirin: Secondary | ICD-10-CM | POA: Diagnosis not present

## 2021-01-22 DIAGNOSIS — S59901A Unspecified injury of right elbow, initial encounter: Secondary | ICD-10-CM | POA: Diagnosis present

## 2021-01-22 DIAGNOSIS — Z87891 Personal history of nicotine dependence: Secondary | ICD-10-CM | POA: Insufficient documentation

## 2021-01-22 DIAGNOSIS — E039 Hypothyroidism, unspecified: Secondary | ICD-10-CM | POA: Diagnosis not present

## 2021-01-22 DIAGNOSIS — Y92007 Garden or yard of unspecified non-institutional (private) residence as the place of occurrence of the external cause: Secondary | ICD-10-CM | POA: Diagnosis not present

## 2021-01-22 DIAGNOSIS — N183 Chronic kidney disease, stage 3 unspecified: Secondary | ICD-10-CM | POA: Diagnosis not present

## 2021-01-22 DIAGNOSIS — Z79899 Other long term (current) drug therapy: Secondary | ICD-10-CM | POA: Insufficient documentation

## 2021-01-22 DIAGNOSIS — Z7902 Long term (current) use of antithrombotics/antiplatelets: Secondary | ICD-10-CM | POA: Diagnosis not present

## 2021-01-22 DIAGNOSIS — Z8546 Personal history of malignant neoplasm of prostate: Secondary | ICD-10-CM | POA: Insufficient documentation

## 2021-01-22 DIAGNOSIS — I129 Hypertensive chronic kidney disease with stage 1 through stage 4 chronic kidney disease, or unspecified chronic kidney disease: Secondary | ICD-10-CM | POA: Diagnosis not present

## 2021-01-22 DIAGNOSIS — W19XXXA Unspecified fall, initial encounter: Secondary | ICD-10-CM

## 2021-01-22 MED ORDER — LIDOCAINE-EPINEPHRINE (PF) 2 %-1:200000 IJ SOLN
20.0000 mL | Freq: Once | INTRAMUSCULAR | Status: AC
  Administered 2021-01-22: 20 mL
  Filled 2021-01-22: qty 20

## 2021-01-22 MED ORDER — CEPHALEXIN 500 MG PO CAPS: 1000.0000 mg | ORAL_CAPSULE | Freq: Two times a day (BID) | ORAL | 0 refills | Status: DC

## 2021-01-22 NOTE — ED Triage Notes (Signed)
Pt fell in yard lacerating right lateral elbow on wooden step. Pt with skin tear, laceration noted to right elbow with controlled bleeding and abrasion noted to right knee. No loc, pt denies striking head.

## 2021-01-22 NOTE — ED Provider Notes (Signed)
[facility] Emergency Department Provider Note  ____________________________________________  Time seen: Approximately 9:06 PM  I have reviewed the triage vital signs and the nursing notes.   HISTORY  Chief Complaint Laceration    HPI Francisco [name] is a 75 y.o. male who presents the emergency department for evaluation of an elbow and knee injury status post a fall.  Patient states that he has had back surgery as well as carotid surgery that had to have significant rehab to be able to walk again.  Patient states that he typically has to walk with assistance at home using either a walker, cane, furniture.  He is able to ambulate without these assistive devices but given his ongoing balance issues he typically uses assistive devices.  Tonight he was trying to walk outside to fill up a bird feeder and as he was walking back his toe caught an uneven surface causing him to fall.  Patient states that he landed on his right knee and struck his elbow against a wooden step as he was falling.  He did not hit his head or lose consciousness.  He denies any headache, neck pain, low back pain.  Patient's primary complaint is laceration over the right elbow.  He denies any joint pain to the elbow or knee.  He bledsomewhat profusely to begin with but was able to stop bleeding prior to arrival.         Past Medical History:  Diagnosis Date  . Arthritis    lower left hip  . Atypical angina ([place])   . Bilateral hand numbness    from back surgery  . Bronchitis, chronic ([place])   . 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 Carotid stenosis   . CKD (chronic kidney disease) stage 3, GFR 30-59 ml/min (HCC)   . 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 DDD (degenerative disc disease), cervical   . Hypercholesterolemia   . Hypertension   . Hypothyroidism    pt takes Levothyroxine daily   . Lumbosacral spinal stenosis   . Myasthenia gravis, adult form ([place])   . PAD (peripheral artery disease) ([place])   . Shortness of breath    Lung MD- Dr Darlin Coco  . Sleep apnea    do not use CPAP every night    Patient Active Problem List   Diagnosis Date Noted  . History of decompression of median nerve 01/01/2021  . Paroxysmal atrial fibrillation ([place]) 10/06/2020  . Carotid stenosis, right 09/17/2020  . S/P cervical spinal fusion   . Leukocytosis   . Essential hypertension   . Anemia of chronic disease   . Postoperative pain   . Neuropathic pain   . Cervical myelopathy ([place]) 08/07/2020  . Preop cardiovascular exam 07/17/2020  . SOB (shortness of breath) on exertion 07/17/2020  . Leg weakness, bilateral 07/13/2020  . Aortic atherosclerosis ([place]) 07/09/2020  . Body mass index (BMI) 34.0-34.9, adult 12/25/2019  . Myalgia 10/30/2019  . Lumbar post-laminectomy syndrome 10/24/2019  . PAD (peripheral artery disease) ([place]) 06/06/2019  . CKD (chronic kidney disease) stage 3, GFR 30-59 ml/min (HCC) 04/29/2019  . B12 deficiency 01/23/2019  . Left arm numbness 02/28/2018  . Neck pain 02/28/2018  . Anemia 02/02/2017  . DDD (degenerative disc disease), cervical 02/02/2017  . Hypothyroid 02/02/2017  . MRSA (methicillin resistant staph aureus) culture positive 02/02/2017  . Nocturnal hypoxia 02/02/2017  .  Senile purpura ([place]) 10/03/2016  . Essential hypertension, benign 09/16/2016  . Bilateral carotid artery disease ([place]) 09/16/2016  . 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/18/2016  . 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 History of prostate cancer 12/21/2015  . Left carpal tunnel syndrome 11/11/2015  . Kidney stone on left side 07/05/2015  . Myasthenia gravis ([place]) 05/26/2015  . Radiculitis 01/05/2015  . Long-term use of high-risk medication 10/15/2014  . Persistent cough 09/10/2014  . Pure hypercholesterolemia 07/25/2014  . Spinal stenosis, lumbar region, with neurogenic  claudication 02/24/2014  . Lumbosacral stenosis with neurogenic claudication ([place]) 02/24/2014  . COPD (chronic obstructive pulmonary disease) ([place]) 01/22/2014    Past Surgical History:  Procedure Laterality Date  . ANTERIOR CERVICAL DECOMP/DISCECTOMY FUSION  07/18/2011   Procedure: ANTERIOR CERVICAL DECOMPRESSION/DISCECTOMY FUSION 2 LEVELS;  Surgeon: Cooper Render Pool;  Location: [place] [facility];  Service: Neurosurgery;  Laterality: N/A;  cervical five-six, cervical six-seven anterior cervical discectomy and fusion  . BACK SURGERY     in [place]     01/2020 Right, 04/2020 Left  . CARDIAC CATHETERIZATION     2005 at [facility], no stents  . CAROTID PTA/STENT INTERVENTION N/A 09/17/2020   Procedure: CAROTID PTA/STENT INTERVENTION;  Surgeon: Algernon Huxley, MD;  Location: [facility];  Service: Cardiovascular;  Laterality: N/A;  . CATARACT EXTRACTION W/PHACO Left 01/06/2020   Procedure: CATARACT EXTRACTION PHACO AND INTRAOCULAR LENS PLACEMENT (IOC) ISTENT INJ LEFT 3.81  00:33.3;  Surgeon: Eulogio Bear, MD;  Location: [place];  Service: Ophthalmology;  Laterality: Left;  . CATARACT EXTRACTION W/PHACO Right 02/03/2020   Procedure: CATARACT EXTRACTION PHACO AND INTRAOCULAR LENS PLACEMENT ([place]) RIGHT ISTENT INJ;  Surgeon: Eulogio Bear, MD;  Location: [place];  Service: Ophthalmology;  Laterality: Right;  4.29 0:35.6  . COLONOSCOPY    . HERNIA REPAIR Left    inguinal hernia repair in 1985  . LUMBAR LAMINECTOMY/DECOMPRESSION MICRODISCECTOMY Left 02/24/2014   Procedure: LUMBAR LAMINECTOMY/DECOMPRESSION MICRODISCECTOMY LUMBAR THREE-FOUR, FOUR-FIVE, LEFT FIVE-SACRAL ONE ;  Surgeon: Charlie Pitter, MD;  Location: [place] NEURO ORS;  Service: Neurosurgery;  Laterality: Left;  LUMBAR LAMINECTOMY/DECOMPRESSION MICRODISCECTOMY LUMBAR THREE-FOUR, FOUR-FIVE, LEFT FIVE-SACRAL ONE   . POSTERIOR CERVICAL FUSION/FORAMINOTOMY N/A 08/07/2020   Procedure:  C3-6 POSTERIOR FUSION WITH DECOMPRESSION;  Surgeon: Meade Maw, MD;  Location: [facility] ORS;  Service: Neurosurgery;  Laterality: N/A;  . PROSTATECTOMY  8/14   [facility] Dr Mare Ferrari     Prior to Admission medications   Medication Sig Start Date End Date Taking? Authorizing Provider  cephALEXin (KEFLEX) 500 MG capsule Take 2 capsules (1,000 mg total) by mouth 2 (two) times daily. 01/22/21  Yes Kalik Hoare, Charline Bills, PA-C  acetaminophen (TYLENOL) 325 MG tablet Take 2 tablets (650 mg total) by mouth every 6 (six) hours as needed for mild pain. Patient not taking: No sig reported 08/11/20   Lonell Face, NP  alum & mag hydroxide-simeth (MAALOX/MYLANTA) 200-200-20 MG/5ML suspension Take 30 mLs by mouth every 6 (six) hours as needed for indigestion. Patient not taking: No sig reported 08/11/20   Lonell Face, NP  aspirin EC 81 MG EC tablet Take 1 tablet (81 mg total) by mouth daily at 6 (six) AM. Swallow whole. 09/20/20   Serafina Mitchell, MD  atorvastatin (LIPITOR) 10 MG tablet Take 1 tablet (10 mg total) by mouth daily. 09/20/20   Serafina Mitchell, MD  azaTHIOprine (IMURAN) 50 MG tablet Take 150 mg by mouth  daily.     [provider]  clopidogrel (PLAVIX) 75 MG tablet Take 1 tablet (75 mg total) by mouth daily at 6 (six) AM. 09/20/20   Trula Slade, Butch Penny, MD  furosemide (LASIX) 20 MG tablet Take 20 mg by mouth 2 (two) times daily as needed (swelling).  02/18/19   [provider]  HYDROcodone-acetaminophen (NORCO/VICODIN) 5-325 MG tablet Take 1 tablet by mouth 2 (two) times daily as needed for moderate pain.    [provider]  iron polysaccharides (NIFEREX) 150 MG capsule Take 1 capsule (150 mg total) by mouth 2 (two) times daily before lunch and supper. 08/20/20   Love, Ivan Anchors, PA-C  levothyroxine (SYNTHROID, LEVOTHROID) 88 MCG tablet Take 88 mcg by mouth daily.    [provider]  losartan (COZAAR) 50 MG tablet Take 50 mg by mouth 2 (two) times daily.      [provider]  magnesium oxide (MAG-OX) 400 (241.3 Mg) MG tablet Take 1 tablet (400 mg total) by mouth daily. Patient not taking: No sig reported 08/20/20   Love, Ivan Anchors, PA-C  oxybutynin (DITROPAN) 5 MG tablet Take 5 mg by mouth 3 (three) times daily. 07/15/20   [provider]  pantoprazole (PROTONIX) 40 MG tablet Take 40 mg by mouth 2 (two) times daily before a meal.     [provider]  polyethylene glycol (MIRALAX / GLYCOLAX) 17 g packet Take 17 g by mouth daily. May increase to twice a day if no BM in 24 hours Patient not taking: No sig reported 08/20/20   Love, Ivan Anchors, PA-C  predniSONE (DELTASONE) 20 MG tablet Take 20 mg by mouth every other day.     [provider]  pregabalin (LYRICA) 100 MG capsule Take 1 capsule (100 mg total) by mouth 2 (two) times daily. Patient not taking: No sig reported 08/20/20   Love, Ivan Anchors, PA-C  pyridostigmine (MESTINON) 60 MG tablet Take 60 mg by mouth in the morning and at bedtime.     [provider]  rOPINIRole (REQUIP) 4 MG tablet Take 4 mg by mouth at bedtime.     [provider]  senna (SENOKOT) 8.6 MG TABS tablet Take 2 tablets (17.2 mg total) by mouth daily. Patient not taking: No sig reported 08/20/20   Love, Ivan Anchors, PA-C  traZODone (DESYREL) 50 MG tablet Take 0.5 tablets (25 mg total) by mouth at bedtime as needed for sleep. 08/20/20   Love, Ivan Anchors, PA-C  vitamin B-12 (CYANOCOBALAMIN) 1000 MCG tablet Take 1,000 mcg by mouth daily. Patient not taking: No sig reported    [provider]    Allergies Azithromycin and Codeine  Family History  Problem Relation Age of Onset  . Hypertension Mother   . Stroke Mother   . Stroke Father     Social History Social History   Tobacco Use  . Smoking status: Former Smoker    Packs/day: 1.00    Years: 20.00    Pack years: 20.00    Types: Cigarettes    Quit date: 09/12/2001    Years since quitting: 19.3  . Smokeless tobacco: Never  Used  Vaping Use  . Vaping Use: Never used  Substance Use Topics  . Alcohol use: Yes    Alcohol/week: 3.0 standard drinks    Types: 3 Glasses of wine per week    Comment: 3 glasses a wine a week  . Drug use: No     Review of Systems  Constitutional: No  fever/chills Eyes: No visual changes. No discharge ENT: No upper respiratory complaints. Cardiovascular: no chest pain. Respiratory: no cough. No SOB. Gastrointestinal: No abdominal pain.  No nausea, no vomiting. Musculoskeletal: Right elbow and knee injury, laceration of the right elbow Skin: Negative for rash, abrasions, lacerations, ecchymosis. Neurological: Negative for headaches, focal weakness or numbness.  10 System ROS otherwise negative.  ____________________________________________   PHYSICAL EXAM:  VITAL SIGNS: ED Triage Vitals  Enc Vitals Group     BP 01/22/21 1900 (!) 153/65     Pulse Rate 01/22/21 1900 81     Resp 01/22/21 1900 16     Temp 01/22/21 1900 97.8 F (36.6 C)     Temp Source 01/22/21 1900 Oral     SpO2 01/22/21 1900 96 %     Weight 01/22/21 1901 221 lb (100.2 kg)     Height 01/22/21 1901 5\' 10"  (1.778 m)     Head Circumference --      Peak Flow --      Pain Score 01/22/21 1901 4     Pain Loc --      Pain Edu? --      Excl. in [place]? --      Constitutional: Alert and oriented. Well appearing and in no acute distress. Eyes: Conjunctivae are normal. PERRL. EOMI. Head: Atraumatic. ENT:      Ears:       Nose: No congestion/rhinnorhea.      Mouth/Throat: Mucous membranes are moist.  Neck: No stridor.    Cardiovascular: Normal rate, regular rhythm. Normal S1 and S2.  Good peripheral circulation. Respiratory: Normal respiratory effort without tachypnea or retractions. Lungs CTAB. Good air entry to the bases with no decreased or absent breath sounds. Musculoskeletal: Full range of motion to all extremities. No gross deformities appreciated.  Visualization of the right elbow reveals skin tears  leading into a large laceration that is crescent-shaped.  Patient has a deep laceration over the lateral elbow and along the ends this turns into a very superficial skin tear.  Deep portion measures approximately 9 cm in length with an additional 4 to 6 cm of skin tear.  No active bleeding.  No visible foreign body.  Patient is able to flex, extend and supinate and pronate the elbow.  Examination of the shoulder and wrist is unremarkable.  Visualization of the right knee reveals no gross edema, ecchymosis.  There is a superficial abrasion with no foreign body on the lateral aspect of the knee.  No tenderness over the bony structures of the knee.  Special tests are negative.  Examination of the hip and ankle is unremarkable. Neurologic:  Normal speech and language. No gross focal neurologic deficits are appreciated.  Skin:  Skin is warm, dry and intact. No rash noted. Psychiatric: Mood and affect are normal. Speech and behavior are normal. Patient exhibits appropriate insight and judgement.   ____________________________________________   LABS (all labs ordered are listed, but only abnormal results are displayed)  Labs Reviewed - No data to display ____________________________________________  EKG   ____________________________________________  RADIOLOGY I personally viewed and evaluated these images as part of my medical decision making, as well as reviewing the written report by the radiologist.  ED Provider Interpretation: No acute traumatic findings about the knee.  Patient does have soft tissue swelling in the area of patient's laceration on elbow films no retained foreign body.  DG Elbow Complete Right  Result Date: 01/22/2021 CLINICAL DATA:  Fall with laceration to elbow EXAM: RIGHT  ELBOW - COMPLETE 3+ VIEW COMPARISON:  None. FINDINGS: There is no evidence of fracture, dislocation, or joint effusion. Well corticated osseous fragment about the lateral humeral epicondyle suggestive of  chronic lateral epicondylitis. Soft tissue swelling over the posterior aspect of the elbow. IMPRESSION: 1. No acute fracture or dislocation. 2. Soft tissue swelling over the posterior aspect of the elbow. No radiopaque foreign body visualized. Electronically Signed   By: Dahlia Bailiff MD   On: 01/22/2021 21:43   DG Knee Complete 4 Views Right  Result Date: 01/22/2021 CLINICAL DATA:  Knee pain after fall EXAM: RIGHT KNEE - COMPLETE 4+ VIEW COMPARISON:  None. FINDINGS: No evidence of fracture, dislocation, or joint effusion. Tricompartment degenerative change most significant at the patellofemoral compartment. A flabella is present. Chondrocalcinosis. Soft tissue swelling over the anterior distal femur. Vascular calcifications. IMPRESSION: 1. No fracture or dislocation. Soft tissue swelling over the anterior distal femur. 2. Tricompartment degenerative change most significant at the patellofemoral compartment with chondrocalcinosis suggestive of CPPD arthropathy. Electronically Signed   By: Dahlia Bailiff MD   On: 01/22/2021 21:46    ____________________________________________    PROCEDURES  Procedure(s) performed:    Marland KitchenMarland KitchenLaceration Repair  Date/Time: 01/22/2021 11:28 PM Performed by: Darletta Moll, PA-C Authorized by: Darletta Moll, PA-C   Consent:    Consent obtained:  Verbal   Consent given by:  Patient   Risks discussed:  Infection, pain, poor cosmetic result, poor wound healing, need for additional repair and retained foreign body Universal protocol:    Procedure explained and questions answered to patient or proxy's satisfaction: yes     Imaging studies available: yes     Immediately prior to procedure, a time out was called: yes     Patient identity confirmed:  Verbally with patient Anesthesia:    Anesthesia method:  Local infiltration   Local anesthetic:  Lidocaine 1% WITH epi Laceration details:    Location:  Shoulder/arm   Shoulder/arm location:  R elbow    Length (cm):  9 Pre-procedure details:    Preparation:  Patient was prepped and draped in usual sterile fashion and imaging obtained to evaluate for foreign bodies Exploration:    Hemostasis achieved with:  Direct pressure and epinephrine   Imaging obtained: x-ray     Imaging outcome: foreign body not noted     Wound exploration: wound explored through full range of motion and entire depth of wound visualized     Wound extent: no fascia violation noted, no foreign bodies/material noted, no muscle damage noted, no nerve damage noted, no tendon damage noted, no underlying fracture noted and no vascular damage noted     Contaminated: no   Treatment:    Area cleansed with:  Povidone-iodine and saline   Amount of cleaning:  Extensive   Irrigation solution:  Sterile saline   Irrigation volume:  2 L   Irrigation method:  Syringe   Layers/structures repaired:  Deep subcutaneous Deep subcutaneous:    Suture size:  4-0   Suture material:  Monocryl   Suture technique:  Simple interrupted   Number of sutures:  8 Skin repair:    Repair method:  Sutures   Suture size:  4-0   Suture material:  Nylon   Suture technique:  Running locked   Number of sutures:  1 (1 running interlock suture with 24 throws) Approximation:    Approximation:  Close Repair type:    Repair type:  Intermediate Post-procedure details:    Dressing:  Non-adherent  dressing and tube gauze   Procedure completion:  Tolerated well, no immediate complications      Medications  lidocaine-EPINEPHrine (XYLOCAINE W/EPI) 2 %-1:200000 (PF) injection 20 mL (20 mLs Infiltration Given 01/22/21 2125)     ____________________________________________   INITIAL IMPRESSION / ASSESSMENT AND PLAN / ED COURSE  Pertinent labs & imaging results that were available during my care of the patient were reviewed by me and considered in my medical decision making (see chart for details).  Review of the [place] CSRS was performed in accordance of  the [place] prior to dispensing any controlled drugs.           Patient's diagnosis is consistent with fall, elbow laceration.  Patient presented to the emergency department after mechanical fall.  Patient has imbalance issues from previous health condition and was walking when he caught his toe on an uneven piece of concrete.  Patient was unable to regain his balance and did not fall on the right side.  He not hit his head or lose consciousness.  He sustained a laceration to the lateral elbow.  Imaging revealed no underlying fractures or no retained foreign body.  Patient had a laceration with both skin tear and true laceration as part of the same injury.  Laceration portion is closed, skin tear was unfortunately unable to be closed as most of the overlying epidermal tissue had been lost.  However laceration approximated well.  I have given strict wound care precautions.  Patient was placed on antibiotics prophylactically.  Follow-up with primary care for suture removal..  Patient is given ED precautions to return to the ED for any worsening or new symptoms.     ____________________________________________  FINAL CLINICAL IMPRESSION(S) / ED DIAGNOSES  Final diagnoses:  Laceration of right elbow, initial encounter  Fall, initial encounter      NEW MEDICATIONS STARTED DURING THIS VISIT:  ED Discharge Orders         Ordered    cephALEXin (KEFLEX) 500 MG capsule  2 times daily        01/22/21 2303              This chart was dictated using voice recognition software/Dragon. Despite best efforts to proofread, errors can occur which can change the meaning. Any change was purely unintentional.    Darletta Moll, PA-C 01/22/21 2331    Carrie Mew, MD 01/22/21 2347

## 2021-01-26 ENCOUNTER — Ambulatory Visit: Payer: Medicare Other

## 2021-01-28 ENCOUNTER — Other Ambulatory Visit: Payer: Self-pay

## 2021-01-28 ENCOUNTER — Ambulatory Visit: Payer: Medicare Other

## 2021-01-28 DIAGNOSIS — R269 Unspecified abnormalities of gait and mobility: Secondary | ICD-10-CM | POA: Diagnosis not present

## 2021-01-28 DIAGNOSIS — R278 Other lack of coordination: Secondary | ICD-10-CM

## 2021-01-28 DIAGNOSIS — R262 Difficulty in walking, not elsewhere classified: Secondary | ICD-10-CM

## 2021-01-28 DIAGNOSIS — M6281 Muscle weakness (generalized): Secondary | ICD-10-CM

## 2021-01-28 NOTE — Therapy (Signed)
[place] MAIN [facility] SERVICES [address] [place], [place], [address] Phone: [phone]   Fax:  [phone]  Physical Therapy Treatment/Physical Therapy Progress Note   Dates of reporting period  12/08/2020   to   01/28/2021  Patient Details  Name: Francisco [name] MRN: [id number] Date of Birth: 1946-04-21 Referring Provider (PT): Lauraine Rinne, PA-C   Encounter Date: 01/28/2021   PT End of Session - 01/28/21 1540    Visit Number 30    Number of Visits 50    Date for PT Re-Evaluation 03/23/21    Authorization Type [employer] Medicare;  8/10 Pn 12/08/20; PN 01/28/2021    Authorization Time Period 08/25/20-11/17/20; 10/06/20-12/29/20; 12/29/2020- 03/23/2021    PT Start Time 1535    PT Stop Time 1610    PT Time Calculation (min) 35 min    Equipment Utilized During Treatment Gait belt    Activity Tolerance Patient tolerated treatment well;No increased pain;Patient limited by fatigue    Behavior During Therapy [facility] for tasks assessed/performed           Past Medical History:  Diagnosis Date  . Arthritis    lower left hip  . Atypical angina ([place])   . Bilateral hand numbness    from back surgery  . Bronchitis, chronic ([place])   . Cancer [facility])    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; Merkel cell cancer, and Basal cell cancer (twice; 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  . Carotid stenosis   . CKD (chronic kidney disease) stage 3, GFR 30-59 ml/min (HCC)   . 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 DDD (degenerative disc disease), cervical   . Hypercholesterolemia   . Hypertension   . Hypothyroidism    pt takes Levothyroxine daily  . Lumbosacral spinal stenosis   . Myasthenia gravis, adult form ([place])   . PAD (peripheral artery disease) ([place])   . Shortness of breath    Lung MD- Dr Darlin Coco  . Sleep apnea    do not use CPAP every night    Past Surgical History:  Procedure Laterality Date  . ANTERIOR CERVICAL DECOMP/DISCECTOMY FUSION   07/18/2011   Procedure: ANTERIOR CERVICAL DECOMPRESSION/DISCECTOMY FUSION 2 LEVELS;  Surgeon: Cooper Render Pool;  Location: [place] NEURO ORS;  Service: Neurosurgery;  Laterality: N/A;  cervical five-six, cervical six-seven anterior cervical discectomy and fusion  . BACK SURGERY     in [place]     01/2020 Right, 04/2020 Left  . CARDIAC CATHETERIZATION     2005 at [facility], no stents  . CAROTID PTA/STENT INTERVENTION N/A 09/17/2020   Procedure: CAROTID PTA/STENT INTERVENTION;  Surgeon: Algernon Huxley, MD;  Location: [place] CV LAB;  Service: Cardiovascular;  Laterality: N/A;  . CATARACT EXTRACTION W/PHACO Left 01/06/2020   Procedure: CATARACT EXTRACTION PHACO AND INTRAOCULAR LENS PLACEMENT (IOC) ISTENT INJ LEFT 3.81  00:33.3;  Surgeon: Eulogio Bear, MD;  Location: [place];  Service: Ophthalmology;  Laterality: Left;  . CATARACT EXTRACTION W/PHACO Right 02/03/2020   Procedure: CATARACT EXTRACTION PHACO AND INTRAOCULAR LENS PLACEMENT ([place]) RIGHT ISTENT INJ;  Surgeon: Eulogio Bear, MD;  Location: [place];  Service: Ophthalmology;  Laterality: Right;  4.29 0:35.6  . COLONOSCOPY    . HERNIA REPAIR Left    inguinal hernia repair in 1985  . LUMBAR LAMINECTOMY/DECOMPRESSION MICRODISCECTOMY Left 02/24/2014   Procedure: LUMBAR LAMINECTOMY/DECOMPRESSION MICRODISCECTOMY LUMBAR THREE-FOUR, FOUR-FIVE, LEFT FIVE-SACRAL ONE ;  Surgeon: Mallie Mussel  A Pool, MD;  Location: [place] NEURO ORS;  Service: Neurosurgery;  Laterality: Left;  LUMBAR LAMINECTOMY/DECOMPRESSION MICRODISCECTOMY LUMBAR THREE-FOUR, FOUR-FIVE, LEFT FIVE-SACRAL ONE   . POSTERIOR CERVICAL FUSION/FORAMINOTOMY N/A 08/07/2020   Procedure: C3-6 POSTERIOR FUSION WITH DECOMPRESSION;  Surgeon: Meade Maw, MD;  Location: [facility] ORS;  Service: Neurosurgery;  Laterality: N/A;  . PROSTATECTOMY  8/14   [facility] Dr Mare Ferrari     There were no vitals filed for this visit.   Subjective Assessment -  01/28/21 1538    Subjective Pt says "as long as I've got that walker to lean on I don't have any pain." He reports when he doesn't weight-bear through UEs he has increased LBP that reaches 7-8/10. He reports no pain currently. Pt reports he feels is gait has improved.    Pertinent History Francisco [name] is a 8yoM referred to OPPT neuro s/p myelopathy s/p cervical decompression and fusion. Pt sustained several months of general decline in mobility prior to surgical decompression. Pt went to inpatient rehab after hospitalization. [facility].  Pt underwent C3-6 decompression, fusion on 11/26. PMH: myasthenia gravis, 2012 ACDF 5/6, 6/7; OSA, hypothyroidism, HTN, COPD, multiple back surgeries, 2021 carpal tunnel release, referred to OPOT, but only seen for evaluation. 8/10 OPPT evaluation for LSS c radiating pain into the left hip, only completed evaluation session. September - November progressive decline in leg strength and tolerance to upright activity. Also noted a decline in tool manipulation for ADL (pen and meal utensil). Seen by OT and PT as an outpatient for other potential etiology (Carpal tunnel syndrome, and lumbar spinal stenosis), but eventually identified to be related to cervical myelopathy. Several months prior has no limitations in mobility or function. Pt scheduled for Jan 6th: Carotid endarterectomy c Dr. Leotis Pain. Pt has a long history of lumbar DJD, s/p L5/S1 decompression discectomy x2, L5 nerve root injections. Most recent Lumbar MRI (11/10/18) revealin gof advanced formaminal stenosis of L3/4, L4/5, L5/S1. Pt is has had LLE neurological weakness and pain since 2015 and has been followed by neurosurgery.    Limitations Walking;Lifting;Standing;House hold activities    How long can you sit comfortably? Not limited    How long can you stand comfortably? 3-5 minutes    How long can you walk comfortably? 10 minutes    Patient Stated Goals return to functional baseline  status of Summer 2021.    Currently in Pain? No/denies             Reassessed all goals today- See goals for details.   Patient performed 6 min walk test but then proceeded two more trials at 185 feet and 210 feet with SPC.   Clinical impression: Patient wanted to progress to using a [facility] today with all testing and mobility. His functional outcome measures were not as good as far as times but reflect progress as he is using a lesser assistive device. He continues to be limited by decreased functional endurance as seen by limitation seen in the 6 min walk test. He also presents as a continued risk of falling at this time but did well with [facility] and has potential to achieve goal of coming off walker. Patient's condition has the potential to improve in response to therapy. Maximum improvement is yet to be obtained. The anticipated improvement is attainable and reasonable in a generally predictable time.                       PT Education -  01/29/21 1247    Education provided Yes    Education Details exercise form    Person(s) Educated Patient    Methods Explanation;Demonstration;Verbal cues    Comprehension Verbalized understanding;Returned demonstration;Verbal cues required;Need further instruction            PT Short Term Goals - 12/08/20 1357      PT SHORT TERM GOAL #1   Title After 6 weeks patient will be independent in HEP to progress strength, AMB, and balance.    Baseline 2/9: HEP compliant 3/29: HEP compliant    Time 6    Period Weeks    Status Achieved    Target Date 11/17/20      PT SHORT TERM GOAL #2   Title After 6 weeks pt will demonstrate improved 5xSTS hands free chair + airex foam in <20sec    Baseline 01/25: 31s hands free without airex foam 2/9: unable to do on airex 3/29 12.03 seconds no UE support required    Time 6    Period Weeks    Status Achieved    Target Date --             PT Long Term Goals - 01/28/21 1551      PT LONG TERM  GOAL #1   Title Patient will reduce timed up and go to <11 seconds to reduce fall risk and demonstrate improved transfer/gait ability.    Baseline 01/25: 30.26s 2/9: 19.55 seconds with RW 3/29: 12.36 seconds with RW.   12/29/2020= 12:15 sec with RW; 01/28/2021= 16 sec with use of SPC    Time 12    Period Weeks    Status On-going    Target Date 03/23/21      PT LONG TERM GOAL #2   Title Patient will demonstrate 6MWT (LRAD) >716ft to demonstrate improved capacity to access the community.    Baseline 12/16: 120 ft, 01/25: 165 ft with 1 seated rest break for 2 mins 2/9: 395 ft with RW and one seated rest break 3/29: 520 ft with RW and one seated rest break. 12/29/2020= 550 feet using RW. 01/28/2021= 185 feet with use of SPC at 1 min 38 sec    Time 12    Period Weeks    Status On-going    Target Date 03/23/21      PT LONG TERM GOAL #3   Title Patient will increase 10 meter walk test to >1.72m/s as to improve gait speed for better community ambulation and to reduce fall risk.    Baseline 01/25: 0.74m/s 2/9: 0.81 m/s with RW 3/29: 0.91 m/s with RW. 12/29/2020=0.91 m/s with RW. 01/28/2021= 0.75 m/s with use of SPC    Time 12    Period Weeks    Status On-going    Target Date 03/23/21      PT LONG TERM GOAL #4   Title After 12 weeks pt to demonstrate improved FOTO score >70    Baseline 01/25: 51 2/9: 55.97 % 3/29: 62.9% 12/29/2020= 56%    Time 12    Period Weeks    Status On-going      PT LONG TERM GOAL #5   Title Patient (> 76 years old) will complete five times sit to stand test in <12.5 seconds indicating an increased LE strength and improved balance.    Baseline 01/25: 31s;  2/9: hands on knees 13.09; 13.16sec hands-free 3/29: 12.03 seconds no UE support.    Time 12    Period Weeks  Status Achieved                 Plan - 01/28/21 1541    Clinical Impression Statement Patient wanted to progress to using a SPC today with all testing and mobility. His functional outcome measures were  not as good as far as times but reflect progress as he is using a lesser assistive device. He continues to be limited by decreased functional endurance as seen by limitation seen in the 6 min walk test. He also presents as a continued risk of falling at this time but did well with [facility] and has potential to achieve goal of coming off walker. Patient's condition has the potential to improve in response to therapy. Maximum improvement is yet to be obtained. The anticipated improvement is attainable and reasonable in a generally predictable time.    Personal Factors and Comorbidities Age;Comorbidity 2;Past/Current Experience;Time since onset of injury/illness/exacerbation    Comorbidities COPD, History of Cancer, myasthenia gravis, chronic lumbar surgical history.    Examination-Activity Limitations Lift;Squat;Bend;Stairs;Stand;Transfers;Insurance claims handler;Bathing;Hygiene/Grooming;Dressing;Toileting    Examination-Participation Restrictions Yard Work;Church;Cleaning;Driving    Stability/Clinical Decision Making Stable/Uncomplicated    Rehab Potential Good    Clinical Impairments Affecting Rehab Potential PMH    PT Frequency 2x / week    PT Duration 12 weeks    PT Treatment/Interventions ADLs/Self Care Home Management;Electrical Stimulation;Moist Heat;Traction;Ultrasound;Gait training;Stair training;Functional mobility training;Therapeutic activities;Therapeutic exercise;Balance training;Neuromuscular re-education;Patient/family education;Manual techniques;Passive range of motion;Dry needling;Joint Manipulations;Cryotherapy    PT Next Visit Plan balance, strength; progress standing balance, dynamic balance, continue POC as previously indicated    PT Home Exercise Plan no changes    Consulted and Agree with Plan of Care Patient           Patient will benefit from skilled therapeutic intervention in order to improve the following deficits and impairments:  Abnormal gait,Decreased  balance,Decreased mobility,Decreased endurance,Difficulty walking,Hypomobility,Impaired sensation,Decreased range of motion,Impaired perceived functional ability,Decreased activity tolerance,Decreased coordination,Decreased strength,Impaired flexibility,Postural dysfunction,Pain  Visit Diagnosis: Abnormality of gait and mobility  Difficulty in walking, not elsewhere classified  Muscle weakness (generalized)  Other lack of coordination     Problem List Patient Active Problem List   Diagnosis Date Noted  . History of decompression of median nerve 01/01/2021  . Paroxysmal atrial fibrillation ([place]) 10/06/2020  . Carotid stenosis, right 09/17/2020  . S/P cervical spinal fusion   . Leukocytosis   . Essential hypertension   . Anemia of chronic disease   . Postoperative pain   . Neuropathic pain   . Cervical myelopathy ([place]) 08/07/2020  . Preop cardiovascular exam 07/17/2020  . SOB (shortness of breath) on exertion 07/17/2020  . Leg weakness, bilateral 07/13/2020  . Aortic atherosclerosis ([place]) 07/09/2020  . Body mass index (BMI) 34.0-34.9, adult 12/25/2019  . Myalgia 10/30/2019  . Lumbar post-laminectomy syndrome 10/24/2019  . PAD (peripheral artery disease) ([place]) 06/06/2019  . CKD (chronic kidney disease) stage 3, GFR 30-59 ml/min (HCC) 04/29/2019  . B12 deficiency 01/23/2019  . Left arm numbness 02/28/2018  . Neck pain 02/28/2018  . Anemia 02/02/2017  . DDD (degenerative disc disease), cervical 02/02/2017  . Hypothyroid 02/02/2017  . MRSA (methicillin resistant staph aureus) culture positive 02/02/2017  . Nocturnal hypoxia 02/02/2017  . Senile purpura ([place]) 10/03/2016  . Essential hypertension, benign 09/16/2016  . Bilateral carotid artery disease ([place]) 09/16/2016  . 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/18/2016  . 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 History of prostate cancer 12/21/2015  . Left carpal tunnel syndrome 11/11/2015  .  Kidney stone on left side  07/05/2015  . Myasthenia gravis ([place]) 05/26/2015  . Radiculitis 01/05/2015  . Long-term use of high-risk medication 10/15/2014  . Persistent cough 09/10/2014  . Pure hypercholesterolemia 07/25/2014  . Spinal stenosis, lumbar region, with neurogenic claudication 02/24/2014  . Lumbosacral stenosis with neurogenic claudication ([place]) 02/24/2014  . COPD (chronic obstructive pulmonary disease) ([place]) 01/22/2014    Lewis Moccasin, PT 01/29/2021, 1:02 PM  [place] MAIN [facility] SERVICES [address] [place], [place], [address] Phone: [phone]   Fax:  [phone]  Name: RULON FREITAG MRN: [id number] Date of Birth: 01/01/1946

## 2021-02-02 ENCOUNTER — Other Ambulatory Visit: Payer: Self-pay

## 2021-02-02 ENCOUNTER — Ambulatory Visit: Payer: Medicare Other

## 2021-02-02 ENCOUNTER — Encounter: Payer: Medicare Other | Admitting: Occupational Therapy

## 2021-02-02 DIAGNOSIS — R269 Unspecified abnormalities of gait and mobility: Secondary | ICD-10-CM

## 2021-02-02 DIAGNOSIS — R262 Difficulty in walking, not elsewhere classified: Secondary | ICD-10-CM

## 2021-02-02 DIAGNOSIS — M6281 Muscle weakness (generalized): Secondary | ICD-10-CM

## 2021-02-02 NOTE — Therapy (Signed)
[place] MAIN [facility] SERVICES [address] [place], [place], [address] Phone: [phone]   Fax:  [phone]  Physical Therapy Treatment  Patient Details  Name: Francisco [name] MRN: [id number] Date of Birth: 1946-03-11 Referring Provider (Francisco [name]): Lauraine Rinne, PA-C   Encounter Date: 02/02/2021   Francisco [name] End of Session - 02/02/21 1356    Visit Number 31    Number of Visits 50    Date for Francisco [name] Re-Evaluation 03/23/21    Authorization Type [employer] Medicare;  1/10; PN 01/28/2021    Authorization Time Period 08/25/20-11/17/20; 10/06/20-12/29/20; 12/29/2020- 03/23/2021    Francisco [name] Start Time 5284    Francisco [name] Stop Time 1429    Francisco [name] Time Calculation (min) 41 min    Equipment Utilized During Treatment Gait belt    Activity Tolerance Patient tolerated treatment well;No increased pain;Patient limited by fatigue    Behavior During Therapy [facility] for tasks assessed/performed           Past Medical History:  Diagnosis Date  . Arthritis    lower left hip  . Atypical angina ([place])   . Bilateral hand numbness    from back surgery  . Bronchitis, chronic ([place])   . Cancer [facility])    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; Merkel cell cancer, and Basal cell cancer (twice; 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  . Carotid stenosis   . CKD (chronic kidney disease) stage 3, GFR 30-59 ml/min (HCC)   . 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 DDD (degenerative disc disease), cervical   . Hypercholesterolemia   . Hypertension   . Hypothyroidism    Francisco [name] takes Levothyroxine daily  . Lumbosacral spinal stenosis   . Myasthenia gravis, adult form ([place])   . PAD (peripheral artery disease) ([place])   . Shortness of breath    Lung MD- Dr Darlin Coco  . Sleep apnea    do not use CPAP every night    Past Surgical History:  Procedure Laterality Date  . ANTERIOR CERVICAL DECOMP/DISCECTOMY FUSION  07/18/2011   Procedure: ANTERIOR CERVICAL DECOMPRESSION/DISCECTOMY FUSION 2 LEVELS;  Surgeon: Cooper Render Pool;   Location: [place] [facility]URO ORS;  Service: Neurosurgery;  Laterality: N/A;  cervical five-six, cervical six-seven anterior cervical discectomy and fusion  . BACK SURGERY     in [place]     01/2020 Right, 04/2020 Left  . CARDIAC CATHETERIZATION     2005 at [facility], no stents  . CAROTID PTA/STENT INTERVENTION N/A 09/17/2020   Procedure: CAROTID PTA/STENT INTERVENTION;  Surgeon: Algernon Huxley, MD;  Location: [place] CV LAB;  Service: Cardiovascular;  Laterality: N/A;  . CATARACT EXTRACTION W/PHACO Left 01/06/2020   Procedure: CATARACT EXTRACTION PHACO AND INTRAOCULAR LENS PLACEMENT (IOC) ISTENT INJ LEFT 3.81  00:33.3;  Surgeon: Eulogio Bear, MD;  Location: [place];  Service: Ophthalmology;  Laterality: Left;  . CATARACT EXTRACTION W/PHACO Right 02/03/2020   Procedure: CATARACT EXTRACTION PHACO AND INTRAOCULAR LENS PLACEMENT ([place]) RIGHT ISTENT INJ;  Surgeon: Eulogio Bear, MD;  Location: [place];  Service: Ophthalmology;  Laterality: Right;  4.29 0:35.6  . COLONOSCOPY    . HERNIA REPAIR Left    inguinal hernia repair in 1985  . LUMBAR LAMINECTOMY/DECOMPRESSION MICRODISCECTOMY Left 02/24/2014   Procedure: LUMBAR LAMINECTOMY/DECOMPRESSION MICRODISCECTOMY LUMBAR THREE-FOUR, FOUR-FIVE, LEFT FIVE-SACRAL ONE ;  Surgeon: Charlie Pitter, MD;  Location: [place] NEURO ORS;  Service: Neurosurgery;  Laterality: Left;  LUMBAR LAMINECTOMY/DECOMPRESSION MICRODISCECTOMY LUMBAR  THREE-FOUR, FOUR-FIVE, LEFT FIVE-SACRAL ONE   . POSTERIOR CERVICAL FUSION/FORAMINOTOMY N/A 08/07/2020   Procedure: C3-6 POSTERIOR FUSION WITH DECOMPRESSION;  Surgeon: Meade Maw, MD;  Location: [facility] ORS;  Service: Neurosurgery;  Laterality: N/A;  . PROSTATECTOMY  8/14   [facility] Dr Mare Ferrari     There were no vitals filed for this visit.   Subjective Assessment - 02/02/21 1352    Subjective Patient reports no falls since 75yoM last week. Is getting his stitches out  this afternoon.    Pertinent History Francisco [name] is a 75yoM referred to OPPT neuro s/p myelopathy s/p cervical decompression and fusion. Francisco [name] sustained several months of general decline in mobility prior to surgical decompression. Francisco [name] went to inpatient rehab after hospitalization. [facility].  Francisco [name] underwent C3-6 decompression, fusion on 11/26. PMH: myasthenia gravis, 2012 ACDF 5/6, 6/7; OSA, hypothyroidism, HTN, COPD, multiple back surgeries, 2021 carpal tunnel release, referred to OPOT, but only seen for evaluation. 8/10 OPPT evaluation for LSS c radiating pain into the left hip, only completed evaluation session. September - November progressive decline in leg strength and tolerance to upright activity. Also noted a decline in tool manipulation for ADL (pen and meal utensil). Seen by OT and Francisco [name] as an outpatient for other potential etiology (Carpal tunnel syndrome, and lumbar spinal stenosis), but eventually identified to be related to cervical myelopathy. Several months prior has no limitations in mobility or function. Francisco [name] scheduled for Jan 6th: Carotid endarterectomy c Dr. Leotis Pain. Francisco [name] has a long history of lumbar DJD, s/p L5/S1 decompression discectomy x2, L5 nerve root injections. Most recent Lumbar MRI (11/10/18) revealin gof advanced formaminal stenosis of L3/4, L4/5, L5/S1. Francisco [name] is has had LLE neurological weakness and pain since 2015 and has been followed by neurosurgery.    Limitations Walking;Lifting;Standing;House hold activities    How long can you sit comfortably? Not limited    How long can you stand comfortably? 3-5 minutes    How long can you walk comfortably? 10 minutes    Patient Stated Goals return to functional baseline status of Summer 2021.    Currently in Pain? No/denies                   Neuro Re-ed:   In // bars orange hurdle step over and back 10x each LE; BUE support; more challenging LLE than RLE Lateral stepping over orange hurdle 10x each  direction.  airex pad: throw balls at target for perturbations x 15 trials  Grapevine 4x length of // bars; very challenging requires max cueing for sequencing bosu ball: round side up: modified forward lunge 10x each LE, BUE support bosu ball; round side up: lateral modified lunge 10x each LE, BUE support, cues for upright posture  Therex: Lateral stepping 6x length of // bars Modified walking lunges with decreasing UE support 4x length of // bars toy solders 4x length of // bars with finger tip support; pain increase in hips   seated swiss ball TrA activation 10x 3 second holds  5 x STS from standard height chair no UE support    Education provided throughout session in the form of demonstration, VC/TC to facilitate movement at target joints and correct muscle activation with exercises.   Patient's is very fatigued this session with increased shortness of breath. He is very challenged by grape vine with sequencing and coordination. His vitals were monitored and were within therapeutic range. His ankle righting reactions are improving with decreased instability on unstable  surfaces. Francisco [name] will continue to benefit from further skilled therapy to improve balance and LE strength to improve ease with ADLs and QOL                 Francisco [name] Education - 02/02/21 1355    Education provided Yes    Education Details exercise technique, body mechanics    Person(s) Educated Patient    Methods Explanation;Demonstration;Tactile cues;Verbal cues    Comprehension Verbalized understanding;Returned demonstration;Verbal cues required;Tactile cues required            Francisco [name] Short Term Goals - 12/08/20 1357      Francisco [name] SHORT TERM GOAL #1   Title After 6 weeks patient will be independent in HEP to progress strength, AMB, and balance.    Baseline 2/9: HEP compliant 3/29: HEP compliant    Time 6    Period Weeks    Status Achieved    Target Date 11/17/20      Francisco [name] SHORT TERM GOAL #2   Title After 6 weeks Francisco [name]  will demonstrate improved 5xSTS hands free chair + airex foam in <20sec    Baseline 01/25: 31s hands free without airex foam 2/9: unable to do on airex 3/29 12.03 seconds no UE support required    Time 6    Period Weeks    Status Achieved    Target Date --             Francisco [name] Long Term Goals - 01/28/21 1551      Francisco [name] LONG TERM GOAL #1   Title Patient will reduce timed up and go to <11 seconds to reduce fall risk and demonstrate improved transfer/gait ability.    Baseline 01/25: 30.26s 2/9: 19.55 seconds with RW 3/29: 12.36 seconds with RW.   12/29/2020= 12:15 sec with RW; 01/28/2021= 16 sec with use of SPC    Time 12    Period Weeks    Status On-going    Target Date 03/23/21      Francisco [name] LONG TERM GOAL #2   Title Patient will demonstrate 6MWT (LRAD) >773ft to demonstrate improved capacity to access the community.    Baseline 12/16: 120 ft, 01/25: 165 ft with 1 seated rest break for 2 mins 2/9: 395 ft with RW and one seated rest break 3/29: 520 ft with RW and one seated rest break. 12/29/2020= 550 feet using RW. 01/28/2021= 185 feet with use of SPC at 1 min 38 sec    Time 12    Period Weeks    Status On-going    Target Date 03/23/21      Francisco [name] LONG TERM GOAL #3   Title Patient will increase 10 meter walk test to >1.84m/s as to improve gait speed for better community ambulation and to reduce fall risk.    Baseline 01/25: 0.85m/s 2/9: 0.81 m/s with RW 3/29: 0.91 m/s with RW. 12/29/2020=0.91 m/s with RW. 01/28/2021= 0.75 m/s with use of SPC    Time 12    Period Weeks    Status On-going    Target Date 03/23/21      Francisco [name] LONG TERM GOAL #4   Title After 12 weeks Francisco [name] to demonstrate improved FOTO score >70    Baseline 01/25: 51 2/9: 55.97 % 3/29: 62.9% 12/29/2020= 56%    Time 12    Period Weeks    Status On-going      Francisco [name] LONG TERM GOAL #5   Title Patient (> 25 years old) will complete five times sit to stand  test in <12.5 seconds indicating an increased LE strength and improved balance.    Baseline  01/25: 31s;  2/9: hands on knees 13.09; 13.16sec hands-free 3/29: 12.03 seconds no UE support.    Time 12    Period Weeks    Status Achieved                 Plan - 02/02/21 1418    Clinical Impression Statement Patient's is very fatigued this session with increased shortness of breath. He is very challenged by grape vine with sequencing and coordination. His vitals were monitored and were within therapeutic range. His ankle righting reactions are improving with decreased instability on unstable surfaces. Francisco [name] will continue to benefit from further skilled therapy to improve balance and LE strength to improve ease with ADLs and QOL    Personal Factors and Comorbidities Age;Comorbidity 2;Past/Current Experience;Time since onset of injury/illness/exacerbation    Comorbidities COPD, History of Cancer, myasthenia gravis, chronic lumbar surgical history.    Examination-Activity Limitations Lift;Squat;Bend;Stairs;Stand;Transfers;Insurance claims handler;Bathing;Hygiene/Grooming;Dressing;Toileting    Examination-Participation Restrictions Yard Work;Church;Cleaning;Driving    Stability/Clinical Decision Making Stable/Uncomplicated    Rehab Potential Good    Clinical Impairments Affecting Rehab Potential PMH    Francisco [name] Frequency 2x / week    Francisco [name] Duration 12 weeks    Francisco [name] Treatment/Interventions ADLs/Self Care Home Management;Electrical Stimulation;Moist Heat;Traction;Ultrasound;Gait training;Stair training;Functional mobility training;Therapeutic activities;Therapeutic exercise;Balance training;Neuromuscular re-education;Patient/family education;Manual techniques;Passive range of motion;Dry needling;Joint Manipulations;Cryotherapy    Francisco [name] Next Visit Plan balance, strength; progress standing balance, dynamic balance, continue POC as previously indicated    Francisco [name] Home Exercise Plan no changes    Consulted and Agree with Plan of Care Patient           Patient will benefit from skilled therapeutic  intervention in order to improve the following deficits and impairments:  Abnormal gait,Decreased balance,Decreased mobility,Decreased endurance,Difficulty walking,Hypomobility,Impaired sensation,Decreased range of motion,Impaired perceived functional ability,Decreased activity tolerance,Decreased coordination,Decreased strength,Impaired flexibility,Postural dysfunction,Pain  Visit Diagnosis: Abnormality of gait and mobility  Difficulty in walking, not elsewhere classified  Muscle weakness (generalized)     Problem List Patient Active Problem List   Diagnosis Date Noted  . History of decompression of median nerve 01/01/2021  . Paroxysmal atrial fibrillation ([place]) 10/06/2020  . Carotid stenosis, right 09/17/2020  . S/P cervical spinal fusion   . Leukocytosis   . Essential hypertension   . Anemia of chronic disease   . Postoperative pain   . Neuropathic pain   . Cervical myelopathy ([place]) 08/07/2020  . Preop cardiovascular exam 07/17/2020  . SOB (shortness of breath) on exertion 07/17/2020  . Leg weakness, bilateral 07/13/2020  . Aortic atherosclerosis ([place]) 07/09/2020  . Body mass index (BMI) 34.0-34.9, adult 12/25/2019  . Myalgia 10/30/2019  . Lumbar post-laminectomy syndrome 10/24/2019  . PAD (peripheral artery disease) ([place]) 06/06/2019  . CKD (chronic kidney disease) stage 3, GFR 30-59 ml/min (HCC) 04/29/2019  . B12 deficiency 01/23/2019  . Left arm numbness 02/28/2018  . Neck pain 02/28/2018  . Anemia 02/02/2017  . DDD (degenerative disc disease), cervical 02/02/2017  . Hypothyroid 02/02/2017  . MRSA (methicillin resistant staph aureus) culture positive 02/02/2017  . Nocturnal hypoxia 02/02/2017  . Senile purpura ([place]) 10/03/2016  . Essential hypertension, benign 09/16/2016  . Bilateral carotid artery disease ([place]) 09/16/2016  . 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/18/2016  . 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 History of prostate cancer 12/21/2015  . Left  carpal tunnel syndrome 11/11/2015  . Kidney stone on left side 07/05/2015  .  Myasthenia gravis ([place]) 05/26/2015  . Radiculitis 01/05/2015  . Long-term use of high-risk medication 10/15/2014  . Persistent cough 09/10/2014  . Pure hypercholesterolemia 07/25/2014  . Spinal stenosis, lumbar region, with neurogenic claudication 02/24/2014  . Lumbosacral stenosis with neurogenic claudication ([place]) 02/24/2014  . COPD (chronic obstructive pulmonary disease) ([place]) 01/22/2014   Francisco [name], Francisco [name], Francisco [name]   02/02/2021, 2:29 PM  [place] MAIN [facility] SERVICES [address] [place], [place], [address] Phone: [phone]   Fax:  [phone]  Name: Francisco [name] MRN: [id number] Date of Birth: [birth year]

## 2021-02-04 ENCOUNTER — Encounter: Payer: Medicare Other | Admitting: Occupational Therapy

## 2021-02-04 ENCOUNTER — Other Ambulatory Visit: Payer: Self-pay

## 2021-02-04 ENCOUNTER — Ambulatory Visit: Payer: Medicare Other

## 2021-02-04 DIAGNOSIS — R278 Other lack of coordination: Secondary | ICD-10-CM

## 2021-02-04 DIAGNOSIS — R269 Unspecified abnormalities of gait and mobility: Secondary | ICD-10-CM

## 2021-02-04 DIAGNOSIS — R262 Difficulty in walking, not elsewhere classified: Secondary | ICD-10-CM

## 2021-02-04 DIAGNOSIS — M6281 Muscle weakness (generalized): Secondary | ICD-10-CM

## 2021-02-04 NOTE — Therapy (Signed)
[place] MAIN [facility] SERVICES [address] [place], [place], [address] Phone: [phone]   Fax:  [phone]  Physical Therapy Treatment  Patient Details  Name: Francisco [name] MRN: [id number] Date of Birth: 11-16-1945 Referring Provider (PT): Lauraine Rinne, PA-C   Encounter Date: 02/04/2021   PT End of Session - 02/04/21 1420    Visit Number 32    Number of Visits 50    Date for PT Re-Evaluation 03/23/21    Authorization Type [employer] Medicare;  2/10; PN 01/28/2021    Authorization Time Period 08/25/20-11/17/20; 10/06/20-12/29/20; 12/29/2020- 03/23/2021    PT Start Time 1350    PT Stop Time 1429    PT Time Calculation (min) 39 min    Equipment Utilized During Treatment Gait belt    Activity Tolerance Patient tolerated treatment well;No increased pain;Patient limited by fatigue    Behavior During Therapy [facility] for tasks assessed/performed           Past Medical History:  Diagnosis Date  . Arthritis    lower left hip  . Atypical angina ([place])   . Bilateral hand numbness    from back surgery  . Bronchitis, chronic ([place])   . Cancer [facility])    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; Merkel cell cancer, and Basal cell cancer (twice; 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  . Carotid stenosis   . CKD (chronic kidney disease) stage 3, GFR 30-59 ml/min (HCC)   . 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 DDD (degenerative disc disease), cervical   . Hypercholesterolemia   . Hypertension   . Hypothyroidism    pt takes Levothyroxine daily  . Lumbosacral spinal stenosis   . Myasthenia gravis, adult form ([place])   . PAD (peripheral artery disease) ([place])   . Shortness of breath    Lung MD- Dr Darlin Coco  . Sleep apnea    do not use CPAP every night    Past Surgical History:  Procedure Laterality Date  . ANTERIOR CERVICAL DECOMP/DISCECTOMY FUSION  07/18/2011   Procedure: ANTERIOR CERVICAL DECOMPRESSION/DISCECTOMY FUSION 2 LEVELS;  Surgeon: Cooper Render Pool;   Location: [place] NEURO ORS;  Service: Neurosurgery;  Laterality: N/A;  cervical five-six, cervical six-seven anterior cervical discectomy and fusion  . BACK SURGERY     in [place]     01/2020 Right, 04/2020 Left  . CARDIAC CATHETERIZATION     2005 at [facility], no stents  . CAROTID PTA/STENT INTERVENTION N/A 09/17/2020   Procedure: CAROTID PTA/STENT INTERVENTION;  Surgeon: Algernon Huxley, MD;  Location: [place] CV LAB;  Service: Cardiovascular;  Laterality: N/A;  . CATARACT EXTRACTION W/PHACO Left 01/06/2020   Procedure: CATARACT EXTRACTION PHACO AND INTRAOCULAR LENS PLACEMENT (IOC) ISTENT INJ LEFT 3.81  00:33.3;  Surgeon: Eulogio Bear, MD;  Location: [place];  Service: Ophthalmology;  Laterality: Left;  . CATARACT EXTRACTION W/PHACO Right 02/03/2020   Procedure: CATARACT EXTRACTION PHACO AND INTRAOCULAR LENS PLACEMENT ([place]) RIGHT ISTENT INJ;  Surgeon: Eulogio Bear, MD;  Location: [place];  Service: Ophthalmology;  Laterality: Right;  4.29 0:35.6  . COLONOSCOPY    . HERNIA REPAIR Left    inguinal hernia repair in 1985  . LUMBAR LAMINECTOMY/DECOMPRESSION MICRODISCECTOMY Left 02/24/2014   Procedure: LUMBAR LAMINECTOMY/DECOMPRESSION MICRODISCECTOMY LUMBAR THREE-FOUR, FOUR-FIVE, LEFT FIVE-SACRAL ONE ;  Surgeon: Charlie Pitter, MD;  Location: [place] NEURO ORS;  Service: Neurosurgery;  Laterality: Left;  LUMBAR LAMINECTOMY/DECOMPRESSION MICRODISCECTOMY LUMBAR  THREE-FOUR, FOUR-FIVE, LEFT FIVE-SACRAL ONE   . POSTERIOR CERVICAL FUSION/FORAMINOTOMY N/A 08/07/2020   Procedure: C3-6 POSTERIOR FUSION WITH DECOMPRESSION;  Surgeon: Meade Maw, MD;  Location: [facility] ORS;  Service: Neurosurgery;  Laterality: N/A;  . PROSTATECTOMY  8/14   [facility] Dr Mare Ferrari     There were no vitals filed for this visit.   Subjective Assessment - 02/04/21 1354    Subjective Patient reports he was late this afternoon due to injurying his arm. No falls  or LOB since last session.    Pertinent History Matas Burrows is a 76yoM referred to OPPT neuro s/p myelopathy s/p cervical decompression and fusion. Pt sustained several months of general decline in mobility prior to surgical decompression. Pt went to inpatient rehab after hospitalization. [facility].  Pt underwent C3-6 decompression, fusion on 11/26. PMH: myasthenia gravis, 2012 ACDF 5/6, 6/7; OSA, hypothyroidism, HTN, COPD, multiple back surgeries, 2021 carpal tunnel release, referred to OPOT, but only seen for evaluation. 8/10 OPPT evaluation for LSS c radiating pain into the left hip, only completed evaluation session. September - November progressive decline in leg strength and tolerance to upright activity. Also noted a decline in tool manipulation for ADL (pen and meal utensil). Seen by OT and PT as an outpatient for other potential etiology (Carpal tunnel syndrome, and lumbar spinal stenosis), but eventually identified to be related to cervical myelopathy. Several months prior has no limitations in mobility or function. Pt scheduled for Jan 6th: Carotid endarterectomy c Dr. Leotis Pain. Pt has a long history of lumbar DJD, s/p L5/S1 decompression discectomy x2, L5 nerve root injections. Most recent Lumbar MRI (11/10/18) revealin gof advanced formaminal stenosis of L3/4, L4/5, L5/S1. Pt is has had LLE neurological weakness and pain since 2015 and has been followed by neurosurgery.    Limitations Walking;Lifting;Standing;House hold activities    How long can you sit comfortably? Not limited    How long can you stand comfortably? 3-5 minutes    How long can you walk comfortably? 10 minutes    Patient Stated Goals return to functional baseline status of Summer 2021.    Currently in Pain? No/denies                 Neuro Re-ed:   In // bars Standing with CGA next to support surface:  Airex pad: static stand 30 seconds x 2 trials, noticeable trembling of ankles/LE's with  fatigue and challenge to maintain stability Airex pad: horizontal head turns 30 seconds scanning room 10x ; cueing for arc of motion  Airex pad: vertical head turns 30 seconds, cueing for arc of motion, noticeable sway with upward gaze increasing demand on ankle righting reaction musculature  Hedgehog taps: forward three taps 10x each LE, SUE support challenging for stabilization on LLE hedgehog forward, lateral, and backwards taps 8x each LE, BUE support  Hallway: horizontal head turns with dual task of ambulation with RW: ambulate scanning and reading alphabet 2x 86 ft, ambulate scanning and reading numbers 2x 86 ft    Seated: RTB taps for coordination, spatial awareness, sequencing, and muscle recruitment patterning 2x 30 seconds  Therex: Modified lunges 10x each LE; BUE support  RTB around toes: seated march with df 10x each LE Seated LAQ with green ball adduction between feet 10x 5 x STS from standard height chair no UE support    Education provided throughout session in the form of demonstration, VC/TC to facilitate movement at target joints and correct muscle activation with exercises.  Patient is highly motivated throughout session despite late start. Ambulation with dual task of head turns results in increased shuffle stepping and decreased foot clearance bilaterally. His LLE fatigues quicker than his right this session with discomfort noted in hip region. Pt will continue to benefit from further skilled therapy to improve balance and LE strength to improve ease with ADLs and QOL                       PT Education - 02/04/21 1420    Education provided Yes    Education Details exercise technique, body mechanics    Person(s) Educated Patient    Methods Explanation;Demonstration;Tactile cues;Verbal cues    Comprehension Verbalized understanding;Returned demonstration;Verbal cues required;Tactile cues required            PT Short Term Goals - 12/08/20 1357       PT SHORT TERM GOAL #1   Title After 6 weeks patient will be independent in HEP to progress strength, AMB, and balance.    Baseline 2/9: HEP compliant 3/29: HEP compliant    Time 6    Period Weeks    Status Achieved    Target Date 11/17/20      PT SHORT TERM GOAL #2   Title After 6 weeks pt will demonstrate improved 5xSTS hands free chair + airex foam in <20sec    Baseline 01/25: 31s hands free without airex foam 2/9: unable to do on airex 3/29 12.03 seconds no UE support required    Time 6    Period Weeks    Status Achieved    Target Date --             PT Long Term Goals - 01/28/21 1551      PT LONG TERM GOAL #1   Title Patient will reduce timed up and go to <11 seconds to reduce fall risk and demonstrate improved transfer/gait ability.    Baseline 01/25: 30.26s 2/9: 19.55 seconds with RW 3/29: 12.36 seconds with RW.   12/29/2020= 12:15 sec with RW; 01/28/2021= 16 sec with use of SPC    Time 12    Period Weeks    Status On-going    Target Date 03/23/21      PT LONG TERM GOAL #2   Title Patient will demonstrate 6MWT (LRAD) >745ft to demonstrate improved capacity to access the community.    Baseline 12/16: 120 ft, 01/25: 165 ft with 1 seated rest break for 2 mins 2/9: 395 ft with RW and one seated rest break 3/29: 520 ft with RW and one seated rest break. 12/29/2020= 550 feet using RW. 01/28/2021= 185 feet with use of SPC at 1 min 38 sec    Time 12    Period Weeks    Status On-going    Target Date 03/23/21      PT LONG TERM GOAL #3   Title Patient will increase 10 meter walk test to >1.62m/s as to improve gait speed for better community ambulation and to reduce fall risk.    Baseline 01/25: 0.70m/s 2/9: 0.81 m/s with RW 3/29: 0.91 m/s with RW. 12/29/2020=0.91 m/s with RW. 01/28/2021= 0.75 m/s with use of SPC    Time 12    Period Weeks    Status On-going    Target Date 03/23/21      PT LONG TERM GOAL #4   Title After 12 weeks pt to demonstrate improved FOTO score >70     Baseline 01/25: 51  2/9: 55.97 % 3/29: 62.9% 12/29/2020= 56%    Time 12    Period Weeks    Status On-going      PT LONG TERM GOAL #5   Title Patient (> 65 years old) will complete five times sit to stand test in <12.5 seconds indicating an increased LE strength and improved balance.    Baseline 01/25: 31s;  2/9: hands on knees 13.09; 13.16sec hands-free 3/29: 12.03 seconds no UE support.    Time 12    Period Weeks    Status Achieved                 Plan - 02/04/21 1534    Clinical Impression Statement Patient is highly motivated throughout session despite late start. Ambulation with dual task of head turns results in increased shuffle stepping and decreased foot clearance bilaterally. His LLE fatigues quicker than his right this session with discomfort noted in hip region. Pt will continue to benefit from further skilled therapy to improve balance and LE strength to improve ease with ADLs and QOL    Personal Factors and Comorbidities Age;Comorbidity 2;Past/Current Experience;Time since onset of injury/illness/exacerbation    Comorbidities COPD, History of Cancer, myasthenia gravis, chronic lumbar surgical history.    Examination-Activity Limitations Lift;Squat;Bend;Stairs;Stand;Transfers;Insurance claims handler;Bathing;Hygiene/Grooming;Dressing;Toileting    Examination-Participation Restrictions Yard Work;Church;Cleaning;Driving    Stability/Clinical Decision Making Stable/Uncomplicated    Rehab Potential Good    Clinical Impairments Affecting Rehab Potential PMH    PT Frequency 2x / week    PT Duration 12 weeks    PT Treatment/Interventions ADLs/Self Care Home Management;Electrical Stimulation;Moist Heat;Traction;Ultrasound;Gait training;Stair training;Functional mobility training;Therapeutic activities;Therapeutic exercise;Balance training;Neuromuscular re-education;Patient/family education;Manual techniques;Passive range of motion;Dry needling;Joint  Manipulations;Cryotherapy    PT Next Visit Plan balance, strength; progress standing balance, dynamic balance, continue POC as previously indicated    PT Home Exercise Plan no changes    Consulted and Agree with Plan of Care Patient           Patient will benefit from skilled therapeutic intervention in order to improve the following deficits and impairments:  Abnormal gait,Decreased balance,Decreased mobility,Decreased endurance,Difficulty walking,Hypomobility,Impaired sensation,Decreased range of motion,Impaired perceived functional ability,Decreased activity tolerance,Decreased coordination,Decreased strength,Impaired flexibility,Postural dysfunction,Pain  Visit Diagnosis: Abnormality of gait and mobility  Difficulty in walking, not elsewhere classified  Muscle weakness (generalized)  Other lack of coordination     Problem List Patient Active Problem List   Diagnosis Date Noted  . History of decompression of median nerve 01/01/2021  . Paroxysmal atrial fibrillation ([place]) 10/06/2020  . Carotid stenosis, right 09/17/2020  . S/P cervical spinal fusion   . Leukocytosis   . Essential hypertension   . Anemia of chronic disease   . Postoperative pain   . Neuropathic pain   . Cervical myelopathy ([place]) 08/07/2020  . Preop cardiovascular exam 07/17/2020  . SOB (shortness of breath) on exertion 07/17/2020  . Leg weakness, bilateral 07/13/2020  . Aortic atherosclerosis ([place]) 07/09/2020  . Body mass index (BMI) 34.0-34.9, adult 12/25/2019  . Myalgia 10/30/2019  . Lumbar post-laminectomy syndrome 10/24/2019  . PAD (peripheral artery disease) ([place]) 06/06/2019  . CKD (chronic kidney disease) stage 3, GFR 30-59 ml/min (HCC) 04/29/2019  . B12 deficiency 01/23/2019  . Left arm numbness 02/28/2018  . Neck pain 02/28/2018  . Anemia 02/02/2017  . DDD (degenerative disc disease), cervical 02/02/2017  . Hypothyroid 02/02/2017  . MRSA (methicillin resistant staph aureus) culture positive  02/02/2017  . Nocturnal hypoxia 02/02/2017  . Senile purpura ([place]) 10/03/2016  . Essential hypertension,  benign 09/16/2016  . Bilateral carotid artery disease ([place]) 09/16/2016  . 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/18/2016  . 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 History of prostate cancer 12/21/2015  . Left carpal tunnel syndrome 11/11/2015  . Kidney stone on left side 07/05/2015  . Myasthenia gravis ([place]) 05/26/2015  . Radiculitis 01/05/2015  . Long-term use of high-risk medication 10/15/2014  . Persistent cough 09/10/2014  . Pure hypercholesterolemia 07/25/2014  . Spinal stenosis, lumbar region, with neurogenic claudication 02/24/2014  . Lumbosacral stenosis with neurogenic claudication ([place]) 02/24/2014  . COPD (chronic obstructive pulmonary disease) ([place]) 01/22/2014   Janna Arch, PT, DPT   02/04/2021, 3:36 PM  [place] MAIN [facility] SERVICES [address] [place], [place], [address] Phone: [phone]   Fax:  [phone]  Name: AMAURIS DEBOIS MRN: [id number] Date of Birth: 1946-05-28

## 2021-02-09 ENCOUNTER — Ambulatory Visit: Payer: Medicare Other

## 2021-02-09 ENCOUNTER — Other Ambulatory Visit: Payer: Self-pay

## 2021-02-09 ENCOUNTER — Encounter: Payer: Medicare Other | Admitting: Occupational Therapy

## 2021-02-09 DIAGNOSIS — R262 Difficulty in walking, not elsewhere classified: Secondary | ICD-10-CM

## 2021-02-09 DIAGNOSIS — R269 Unspecified abnormalities of gait and mobility: Secondary | ICD-10-CM | POA: Diagnosis not present

## 2021-02-09 DIAGNOSIS — M6281 Muscle weakness (generalized): Secondary | ICD-10-CM

## 2021-02-09 NOTE — Therapy (Signed)
[place] MAIN [facility] SERVICES [address] [place], [place], [address] Phone: [phone]   Fax:  [phone]  Physical Therapy Treatment  Patient Details  Name: Francisco [name] MRN: [id number] Date of Birth: 11-21-1945 Referring Provider (PT): Lauraine Rinne, PA-C   Encounter Date: 02/09/2021   PT End of Session - 02/09/21 1416    Visit Number 33    Number of Visits 50    Date for PT Re-Evaluation 03/23/21    Authorization Type [employer] Medicare;  3/10; PN 01/28/2021    Authorization Time Period 08/25/20-11/17/20; 10/06/20-12/29/20; 12/29/2020- 03/23/2021    PT Start Time 0093    PT Stop Time 1429    PT Time Calculation (min) 32 min    Equipment Utilized During Treatment Gait belt    Activity Tolerance Patient tolerated treatment well;No increased pain;Patient limited by fatigue    Behavior During Therapy [facility] for tasks assessed/performed           Past Medical History:  Diagnosis Date  . Arthritis    lower left hip  . Atypical angina ([place])   . Bilateral hand numbness    from back surgery  . Bronchitis, chronic ([place])   . Cancer [facility])    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; Merkel cell cancer, and Basal cell cancer (twice; 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  . Carotid stenosis   . CKD (chronic kidney disease) stage 3, GFR 30-59 ml/min (HCC)   . 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 DDD (degenerative disc disease), cervical   . Hypercholesterolemia   . Hypertension   . Hypothyroidism    pt takes Levothyroxine daily  . Lumbosacral spinal stenosis   . Myasthenia gravis, adult form ([place])   . PAD (peripheral artery disease) ([place])   . Shortness of breath    Lung MD- Dr Darlin Coco  . Sleep apnea    do not use CPAP every night    Past Surgical History:  Procedure Laterality Date  . ANTERIOR CERVICAL DECOMP/DISCECTOMY FUSION  07/18/2011   Procedure: ANTERIOR CERVICAL DECOMPRESSION/DISCECTOMY FUSION 2 LEVELS;  Surgeon: Cooper Render Pool;   Location: [place] NEURO ORS;  Service: Neurosurgery;  Laterality: N/A;  cervical five-six, cervical six-seven anterior cervical discectomy and fusion  . BACK SURGERY     in [place]     01/2020 Right, 04/2020 Left  . CARDIAC CATHETERIZATION     2005 at [facility], no stents  . CAROTID PTA/STENT INTERVENTION N/A 09/17/2020   Procedure: CAROTID PTA/STENT INTERVENTION;  Surgeon: Algernon Huxley, MD;  Location: [place] CV LAB;  Service: Cardiovascular;  Laterality: N/A;  . CATARACT EXTRACTION W/PHACO Left 01/06/2020   Procedure: CATARACT EXTRACTION PHACO AND INTRAOCULAR LENS PLACEMENT (IOC) ISTENT INJ LEFT 3.81  00:33.3;  Surgeon: Eulogio Bear, MD;  Location: [place];  Service: Ophthalmology;  Laterality: Left;  . CATARACT EXTRACTION W/PHACO Right 02/03/2020   Procedure: CATARACT EXTRACTION PHACO AND INTRAOCULAR LENS PLACEMENT ([place]) RIGHT ISTENT INJ;  Surgeon: Eulogio Bear, MD;  Location: [place];  Service: Ophthalmology;  Laterality: Right;  4.29 0:35.6  . COLONOSCOPY    . HERNIA REPAIR Left    inguinal hernia repair in 1985  . LUMBAR LAMINECTOMY/DECOMPRESSION MICRODISCECTOMY Left 02/24/2014   Procedure: LUMBAR LAMINECTOMY/DECOMPRESSION MICRODISCECTOMY LUMBAR THREE-FOUR, FOUR-FIVE, LEFT FIVE-SACRAL ONE ;  Surgeon: Charlie Pitter, MD;  Location: [place] NEURO ORS;  Service: Neurosurgery;  Laterality: Left;  LUMBAR LAMINECTOMY/DECOMPRESSION MICRODISCECTOMY LUMBAR  THREE-FOUR, FOUR-FIVE, LEFT FIVE-SACRAL ONE   . POSTERIOR CERVICAL FUSION/FORAMINOTOMY N/A 08/07/2020   Procedure: C3-6 POSTERIOR FUSION WITH DECOMPRESSION;  Surgeon: Meade Maw, MD;  Location: [facility] ORS;  Service: Neurosurgery;  Laterality: N/A;  . PROSTATECTOMY  8/14   [facility] Dr Mare Ferrari     There were no vitals filed for this visit.   Subjective Assessment - 02/09/21 1400    Subjective Patient reports he is late due to having to get his stitches out. No falls or  LOB since last session.    Pertinent History Francisco [name] is a 75yoM referred to OPPT neuro s/p myelopathy s/p cervical decompression and fusion. Pt sustained several months of general decline in mobility prior to surgical decompression. Pt went to inpatient rehab after hospitalization. [facility].  Pt underwent C3-6 decompression, fusion on 11/26. PMH: myasthenia gravis, 2012 ACDF 5/6, 6/7; OSA, hypothyroidism, HTN, COPD, multiple back surgeries, 2021 carpal tunnel release, referred to OPOT, but only seen for evaluation. 8/10 OPPT evaluation for LSS c radiating pain into the left hip, only completed evaluation session. September - November progressive decline in leg strength and tolerance to upright activity. Also noted a decline in tool manipulation for ADL (pen and meal utensil). Seen by OT and PT as an outpatient for other potential etiology (Carpal tunnel syndrome, and lumbar spinal stenosis), but eventually identified to be related to cervical myelopathy. Several months prior has no limitations in mobility or function. Pt scheduled for Jan 6th: Carotid endarterectomy c Dr. Leotis Pain. Pt has a long history of lumbar DJD, s/p L5/S1 decompression discectomy x2, L5 nerve root injections. Most recent Lumbar MRI (11/10/18) revealin gof advanced formaminal stenosis of L3/4, L4/5, L5/S1. Pt is has had LLE neurological weakness and pain since 2015 and has been followed by neurosurgery.    Limitations Walking;Lifting;Standing;House hold activities    How long can you sit comfortably? Not limited    How long can you stand comfortably? 3-5 minutes    How long can you walk comfortably? 10 minutes    Patient Stated Goals return to functional baseline status of Summer 2021.    Currently in Pain? No/denies            Neuro Re-ed:   In // bars Standing with CGA next to support surface:  Airex pad:6" step modified tandem stance 2x 30 seconds no UE support  airex pad 6" step toe taps SUE  to no UE support 10x each LE; very challenging for patient.  3 cone taps with UE support 8x each LE; very challenging    Seated: RTB taps for coordination, spatial awareness, sequencing, and muscle recruitment patterning 2x 30 seconds   Therex: 6" step lateral step up down/ SUE support 10x each LE, patient reports RLE more challenging than LLE 6" step eccentric heel step down 10x each LE Modified skaters 10x each LE; UE suppport  RTB around toes: seated march with df 10x each LE Seated LAQ with green ball adduction between feet 10x    Education provided throughout session in the form of demonstration, VC/TC to facilitate movement at target joints and correct muscle activation with exercises.    Patient's session is limited by late arrival. LE weakness is improving with patient tolerating progressive strengthening and stabilization interventions. He is highly motivated for progression at this time and his prognosis is good. Pt will continue to benefit from further skilled therapy to improve balance and LE strength to improve ease with ADLs and QOL  PT Education - 02/09/21 1416    Education provided Yes    Education Details exercise technique, body mechanics    Person(s) Educated Patient    Methods Explanation;Demonstration;Tactile cues;Verbal cues    Comprehension Verbalized understanding;Returned demonstration;Verbal cues required;Tactile cues required            PT Short Term Goals - 12/08/20 1357      PT SHORT TERM GOAL #1   Title After 6 weeks patient will be independent in HEP to progress strength, AMB, and balance.    Baseline 2/9: HEP compliant 3/29: HEP compliant    Time 6    Period Weeks    Status Achieved    Target Date 11/17/20      PT SHORT TERM GOAL #2   Title After 6 weeks pt will demonstrate improved 5xSTS hands free chair + airex foam in <20sec    Baseline 01/25: 31s hands free without airex foam 2/9: unable to do  on airex 3/29 12.03 seconds no UE support required    Time 6    Period Weeks    Status Achieved    Target Date --             PT Long Term Goals - 01/28/21 1551      PT LONG TERM GOAL #1   Title Patient will reduce timed up and go to <11 seconds to reduce fall risk and demonstrate improved transfer/gait ability.    Baseline 01/25: 30.26s 2/9: 19.55 seconds with RW 3/29: 12.36 seconds with RW.   12/29/2020= 12:15 sec with RW; 01/28/2021= 16 sec with use of SPC    Time 12    Period Weeks    Status On-going    Target Date 03/23/21      PT LONG TERM GOAL #2   Title Patient will demonstrate 6MWT (LRAD) >744ft to demonstrate improved capacity to access the community.    Baseline 12/16: 120 ft, 01/25: 165 ft with 1 seated rest break for 2 mins 2/9: 395 ft with RW and one seated rest break 3/29: 520 ft with RW and one seated rest break. 12/29/2020= 550 feet using RW. 01/28/2021= 185 feet with use of SPC at 1 min 38 sec    Time 12    Period Weeks    Status On-going    Target Date 03/23/21      PT LONG TERM GOAL #3   Title Patient will increase 10 meter walk test to >1.18m/s as to improve gait speed for better community ambulation and to reduce fall risk.    Baseline 01/25: 0.32m/s 2/9: 0.81 m/s with RW 3/29: 0.91 m/s with RW. 12/29/2020=0.91 m/s with RW. 01/28/2021= 0.75 m/s with use of SPC    Time 12    Period Weeks    Status On-going    Target Date 03/23/21      PT LONG TERM GOAL #4   Title After 12 weeks pt to demonstrate improved FOTO score >70    Baseline 01/25: 51 2/9: 55.97 % 3/29: 62.9% 12/29/2020= 56%    Time 12    Period Weeks    Status On-going      PT LONG TERM GOAL #5   Title Patient (> 44 years old) will complete five times sit to stand test in <12.5 seconds indicating an increased LE strength and improved balance.    Baseline 01/25: 31s;  2/9: hands on knees 13.09; 13.16sec hands-free 3/29: 12.03 seconds no UE support.    Time 12  Period Weeks    Status Achieved                  Plan - 02/09/21 1427    Clinical Impression Statement Patient's session is limited by late arrival. LE weakness is improving with patient tolerating progressive strengthening and stabilization interventions. He is highly motivated for progression at this time and his prognosis is good. Pt will continue to benefit from further skilled therapy to improve balance and LE strength to improve ease with ADLs and QOL    Personal Factors and Comorbidities Age;Comorbidity 2;Past/Current Experience;Time since onset of injury/illness/exacerbation    Comorbidities COPD, History of Cancer, myasthenia gravis, chronic lumbar surgical history.    Examination-Activity Limitations Lift;Squat;Bend;Stairs;Stand;Transfers;Insurance claims handler;Bathing;Hygiene/Grooming;Dressing;Toileting    Examination-Participation Restrictions Yard Work;Church;Cleaning;Driving    Stability/Clinical Decision Making Stable/Uncomplicated    Rehab Potential Good    Clinical Impairments Affecting Rehab Potential PMH    PT Frequency 2x / week    PT Duration 12 weeks    PT Treatment/Interventions ADLs/Self Care Home Management;Electrical Stimulation;Moist Heat;Traction;Ultrasound;Gait training;Stair training;Functional mobility training;Therapeutic activities;Therapeutic exercise;Balance training;Neuromuscular re-education;Patient/family education;Manual techniques;Passive range of motion;Dry needling;Joint Manipulations;Cryotherapy    PT Next Visit Plan balance, strength; progress standing balance, dynamic balance, continue POC as previously indicated    PT Home Exercise Plan no changes    Consulted and Agree with Plan of Care Patient           Patient will benefit from skilled therapeutic intervention in order to improve the following deficits and impairments:  Abnormal gait,Decreased balance,Decreased mobility,Decreased endurance,Difficulty walking,Hypomobility,Impaired sensation,Decreased range of  motion,Impaired perceived functional ability,Decreased activity tolerance,Decreased coordination,Decreased strength,Impaired flexibility,Postural dysfunction,Pain  Visit Diagnosis: Abnormality of gait and mobility  Difficulty in walking, not elsewhere classified  Muscle weakness (generalized)     Problem List Patient Active Problem List   Diagnosis Date Noted  . History of decompression of median nerve 01/01/2021  . Paroxysmal atrial fibrillation ([place]) 10/06/2020  . Carotid stenosis, right 09/17/2020  . S/P cervical spinal fusion   . Leukocytosis   . Essential hypertension   . Anemia of chronic disease   . Postoperative pain   . Neuropathic pain   . Cervical myelopathy ([place]) 08/07/2020  . Preop cardiovascular exam 07/17/2020  . SOB (shortness of breath) on exertion 07/17/2020  . Leg weakness, bilateral 07/13/2020  . Aortic atherosclerosis ([place]) 07/09/2020  . Body mass index (BMI) 34.0-34.9, adult 12/25/2019  . Myalgia 10/30/2019  . Lumbar post-laminectomy syndrome 10/24/2019  . PAD (peripheral artery disease) ([place]) 06/06/2019  . CKD (chronic kidney disease) stage 3, GFR 30-59 ml/min (HCC) 04/29/2019  . B12 deficiency 01/23/2019  . Left arm numbness 02/28/2018  . Neck pain 02/28/2018  . Anemia 02/02/2017  . DDD (degenerative disc disease), cervical 02/02/2017  . Hypothyroid 02/02/2017  . MRSA (methicillin resistant staph aureus) culture positive 02/02/2017  . Nocturnal hypoxia 02/02/2017  . Senile purpura ([place]) 10/03/2016  . Essential hypertension, benign 09/16/2016  . Bilateral carotid artery disease ([place]) 09/16/2016  . 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/18/2016  . 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 History of prostate cancer 12/21/2015  . Left carpal tunnel syndrome 11/11/2015  . Kidney stone on left side 07/05/2015  . Myasthenia gravis ([place]) 05/26/2015  . Radiculitis 01/05/2015  . Long-term use of high-risk medication 10/15/2014  . Persistent cough  09/10/2014  . Pure hypercholesterolemia 07/25/2014  . Spinal stenosis, lumbar region, with neurogenic claudication 02/24/2014  . Lumbosacral stenosis with neurogenic claudication ([place]) 02/24/2014  . COPD (chronic obstructive  pulmonary disease) ([place]) 01/22/2014   Janna Arch, PT, DPT   02/09/2021, 3:33 PM  [place] MAIN [facility] SERVICES [address] [place], [place], [address] Phone: [phone]   Fax:  [phone]  Name: JUELL RADNEY MRN: [id number] Date of Birth: 11/27/1945

## 2021-02-11 ENCOUNTER — Ambulatory Visit: Payer: Medicare Other

## 2021-02-16 ENCOUNTER — Other Ambulatory Visit: Payer: Self-pay

## 2021-02-16 ENCOUNTER — Encounter: Payer: Medicare Other | Admitting: Occupational Therapy

## 2021-02-16 ENCOUNTER — Ambulatory Visit: Payer: Medicare Other | Attending: Internal Medicine

## 2021-02-16 DIAGNOSIS — R278 Other lack of coordination: Secondary | ICD-10-CM | POA: Diagnosis present

## 2021-02-16 DIAGNOSIS — R2689 Other abnormalities of gait and mobility: Secondary | ICD-10-CM | POA: Diagnosis present

## 2021-02-16 DIAGNOSIS — R269 Unspecified abnormalities of gait and mobility: Secondary | ICD-10-CM | POA: Diagnosis not present

## 2021-02-16 DIAGNOSIS — M6281 Muscle weakness (generalized): Secondary | ICD-10-CM | POA: Insufficient documentation

## 2021-02-16 DIAGNOSIS — R262 Difficulty in walking, not elsewhere classified: Secondary | ICD-10-CM

## 2021-02-16 NOTE — Therapy (Signed)
[place] MAIN [facility] SERVICES [address] [place], [place], [address] Phone: [phone]   Fax:  [phone]  Physical Therapy Treatment  Patient Details  Name: Francisco [name] MRN: [id number] Date of Birth: 16-Nov-1945 Referring Provider (PT): Lauraine Rinne, PA-C   Encounter Date: 02/16/2021   PT End of Session - 02/16/21 1652    Visit Number 34    Number of Visits 50    Date for PT Re-Evaluation 03/23/21    Authorization Type UHC Medicare;  3/10; PN 01/28/2021    Authorization Time Period 08/25/20-11/17/20; 10/06/20-12/29/20; 12/29/2020- 03/23/2021    PT Start Time 1532    PT Stop Time 1605    PT Time Calculation (min) 33 min    Equipment Utilized During Treatment Gait belt    Activity Tolerance Patient tolerated treatment well;No increased pain;Patient limited by fatigue    Behavior During Therapy [facility] for tasks assessed/performed           Past Medical History:  Diagnosis Date  . Arthritis    lower left hip  . Atypical angina ([place])   . Bilateral hand numbness    from back surgery  . Bronchitis, chronic ([place])   . Cancer [facility])    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; Merkel cell cancer, and Basal cell cancer (twice; 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  . Carotid stenosis   . CKD (chronic kidney disease) stage 3, GFR 30-59 ml/min (HCC)   . 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 DDD (degenerative disc disease), cervical   . Hypercholesterolemia   . Hypertension   . Hypothyroidism    pt takes Levothyroxine daily  . Lumbosacral spinal stenosis   . Myasthenia gravis, adult form ([place])   . PAD (peripheral artery disease) ([place])   . Shortness of breath    Lung MD- Dr Darlin Coco  . Sleep apnea    do not use CPAP every night    Past Surgical History:  Procedure Laterality Date  . ANTERIOR CERVICAL DECOMP/DISCECTOMY FUSION  07/18/2011   Procedure: ANTERIOR CERVICAL DECOMPRESSION/DISCECTOMY FUSION 2 LEVELS;  Surgeon: Cooper Render Pool;   Location: [place] [facility]S;  Service: Neurosurgery;  Laterality: N/A;  cervical five-six, cervical six-seven anterior cervical discectomy and fusion  . BACK SURGERY     in [place]     01/2020 Right, 04/2020 Left  . CARDIAC CATHETERIZATION     2005 at [facility], no stents  . CAROTID PTA/STENT INTERVENTION N/A 09/17/2020   Procedure: CAROTID PTA/STENT INTERVENTION;  Surgeon: Algernon Huxley, MD;  Location: [place] CV LAB;  Service: Cardiovascular;  Laterality: N/A;  . CATARACT EXTRACTION W/PHACO Left 01/06/2020   Procedure: CATARACT EXTRACTION PHACO AND INTRAOCULAR LENS PLACEMENT (IOC) ISTENT INJ LEFT 3.81  00:33.3;  Surgeon: Eulogio Bear, MD;  Location: [place];  Service: Ophthalmology;  Laterality: Left;  . CATARACT EXTRACTION W/PHACO Right 02/03/2020   Procedure: CATARACT EXTRACTION PHACO AND INTRAOCULAR LENS PLACEMENT ([place]) RIGHT ISTENT INJ;  Surgeon: Eulogio Bear, MD;  Location: [place];  Service: Ophthalmology;  Laterality: Right;  4.29 0:35.6  . COLONOSCOPY    . HERNIA REPAIR Left    inguinal hernia repair in 1985  . LUMBAR LAMINECTOMY/DECOMPRESSION MICRODISCECTOMY Left 02/24/2014   Procedure: LUMBAR LAMINECTOMY/DECOMPRESSION MICRODISCECTOMY LUMBAR THREE-FOUR, FOUR-FIVE, LEFT FIVE-SACRAL ONE ;  Surgeon: Charlie Pitter, MD;  Location: [place] [facility] ORS;  Service: Neurosurgery;  Laterality: Left;  LUMBAR LAMINECTOMY/DECOMPRESSION MICRODISCECTOMY LUMBAR  THREE-FOUR, FOUR-FIVE, LEFT FIVE-SACRAL ONE   . POSTERIOR CERVICAL FUSION/FORAMINOTOMY N/A 08/07/2020   Procedure: C3-6 POSTERIOR FUSION WITH DECOMPRESSION;  Surgeon: Meade Maw, MD;  Location: [facility] ORS;  Service: Neurosurgery;  Laterality: N/A;  . PROSTATECTOMY  8/14   [facility] Dr Mare Ferrari     There were no vitals filed for this visit.   Subjective Assessment - 02/16/21 1538    Subjective Patient reports feeling weaker over past week. "My legs are just not moving  well." Patient denies any falls but reports being very careful due to being so weak.    Pertinent History Francisco [name] is a 80yoM referred to OPPT neuro s/p myelopathy s/p cervical decompression and fusion. Pt sustained several months of general decline in mobility prior to surgical decompression. Pt went to inpatient rehab after hospitalization. [facility].  Pt underwent C3-6 decompression, fusion on 11/26. PMH: myasthenia gravis, 2012 ACDF 5/6, 6/7; OSA, hypothyroidism, HTN, COPD, multiple back surgeries, 2021 carpal tunnel release, referred to OPOT, but only seen for evaluation. 8/10 OPPT evaluation for LSS c radiating pain into the left hip, only completed evaluation session. September - November progressive decline in leg strength and tolerance to upright activity. Also noted a decline in tool manipulation for ADL (pen and meal utensil). Seen by OT and PT as an outpatient for other potential etiology (Carpal tunnel syndrome, and lumbar spinal stenosis), but eventually identified to be related to cervical myelopathy. Several months prior has no limitations in mobility or function. Pt scheduled for Jan 6th: Carotid endarterectomy c Dr. Leotis Pain. Pt has a long history of lumbar DJD, s/p L5/S1 decompression discectomy x2, L5 nerve root injections. Most recent Lumbar MRI (11/10/18) revealin gof advanced formaminal stenosis of L3/4, L4/5, L5/S1. Pt is has had LLE neurological weakness and pain since 2015 and has been followed by neurosurgery.    Limitations Walking;Lifting;Standing;House hold activities    How long can you sit comfortably? Not limited    How long can you stand comfortably? 3-5 minutes    How long can you walk comfortably? 10 minutes    Patient Stated Goals return to functional baseline status of Summer 2021.    Currently in Pain? No/denies           Assessed LE Muscle strength:   Hip flex: R=4+/5; L=3+/5 Hip abd: R=4+/5; L=4/5 Knee ext: R= 4+/5; L= 3+/5 Knee  flex: R= 4+/5; L=4/5 Ankle DF: R=5/5; L=2-/5 No pain - just weakness noted throughout L LE and patient reporting "My legs are just not holding me up."  Assessed transfers/gait: Patient able to sit to stand with mod I using BUE support of armrest and front wheeled walker in front of him.   Gait- Patient ambulated approx 180 feet using front wheeled walker, CGA with close w/c follow- Exhibiting  Increased UE support with distance and intermittent left knee buckling without warning- last one requiring CGA to maintain balance so terminated walking.   Reviewed seated LE Strengthening for HEP and patient presents with excellent recall of seated therex including hip march, hip abd, knee ext, and ankle DF and seated calf raises. Instructed to continue to use walker for all mobility. Patient reports he has appointment with Dr. Earnie Larsson (Neurosurgeon) tomorrow and going to discuss his progressive weakness.   Assessed Car transfer: Patient demo independence- folding his walker and opening car doors- safely using car door and frame to walk to front door from back door and stand to sit without knee buckling or  significant difficulty.   Clinical Impression: Unable to progress patient today as he was reporting increased overall LE weakness and presents today with left knee buckling with walking with increased risk of falling. Did not try to progress balance or endurance today and reassessed LE muscle strength. Patient presents with increased Left LE muscle weakness today - as seen with manual muscle testing and gait assessment. Patient going to follow up with neurosurgeon tomorrow. Will follow up with patient on next visit. Pt will continue to benefit from further skilled therapy to improve balance and LE strength to improve ease with ADLs and QOL                          PT Education - 02/16/21 1655    Education provided Yes    Education Details Specific seated LE exercise plan     Person(s) Educated Patient    Methods Explanation;Demonstration;Verbal cues    Comprehension Verbalized understanding;Returned demonstration;Verbal cues required;Need further instruction            PT Short Term Goals - 12/08/20 1357      PT SHORT TERM GOAL #1   Title After 6 weeks patient will be independent in HEP to progress strength, AMB, and balance.    Baseline 2/9: HEP compliant 3/29: HEP compliant    Time 6    Period Weeks    Status Achieved    Target Date 11/17/20      PT SHORT TERM GOAL #2   Title After 6 weeks pt will demonstrate improved 5xSTS hands free chair + airex foam in <20sec    Baseline 01/25: 31s hands free without airex foam 2/9: unable to do on airex 3/29 12.03 seconds no UE support required    Time 6    Period Weeks    Status Achieved    Target Date --             PT Long Term Goals - 01/28/21 1551      PT LONG TERM GOAL #1   Title Patient will reduce timed up and go to <11 seconds to reduce fall risk and demonstrate improved transfer/gait ability.    Baseline 01/25: 30.26s 2/9: 19.55 seconds with RW 3/29: 12.36 seconds with RW.   12/29/2020= 12:15 sec with RW; 01/28/2021= 16 sec with use of SPC    Time 12    Period Weeks    Status On-going    Target Date 03/23/21      PT LONG TERM GOAL #2   Title Patient will demonstrate 6MWT (LRAD) >786ft to demonstrate improved capacity to access the community.    Baseline 12/16: 120 ft, 01/25: 165 ft with 1 seated rest break for 2 mins 2/9: 395 ft with RW and one seated rest break 3/29: 520 ft with RW and one seated rest break. 12/29/2020= 550 feet using RW. 01/28/2021= 185 feet with use of SPC at 1 min 38 sec    Time 12    Period Weeks    Status On-going    Target Date 03/23/21      PT LONG TERM GOAL #3   Title Patient will increase 10 meter walk test to >1.72m/s as to improve gait speed for better community ambulation and to reduce fall risk.    Baseline 01/25: 0.43m/s 2/9: 0.81 m/s with RW 3/29: 0.91 m/s  with RW. 12/29/2020=0.91 m/s with RW. 01/28/2021= 0.75 m/s with use of SPC    Time 12  Period Weeks    Status On-going    Target Date 03/23/21      PT LONG TERM GOAL #4   Title After 12 weeks pt to demonstrate improved FOTO score >70    Baseline 01/25: 51 2/9: 55.97 % 3/29: 62.9% 12/29/2020= 56%    Time 12    Period Weeks    Status On-going      PT LONG TERM GOAL #5   Title Patient (> 71 years old) will complete five times sit to stand test in <12.5 seconds indicating an increased LE strength and improved balance.    Baseline 01/25: 31s;  2/9: hands on knees 13.09; 13.16sec hands-free 3/29: 12.03 seconds no UE support.    Time 12    Period Weeks    Status Achieved                 Plan - 02/16/21 1648    Clinical Impression Statement Unable to progress patient today as he was reporting increased overall LE weakness and presents today with left knee buckling with walking with increased risk of falling. Did not try to progress balance or endurance today and reassessed LE muscle strength. Patient presents with increased Left LE muscle weakness today - as seen with manual muscle testing and gait assessment. Patient going to follow up with neurosurgeon tomorrow. Will follow up with patient on next visit. Pt will continue to benefit from further skilled therapy to improve balance and LE strength to improve ease with ADLs and QOL    Personal Factors and Comorbidities Age;Comorbidity 2;Past/Current Experience;Time since onset of injury/illness/exacerbation    Comorbidities COPD, History of Cancer, myasthenia gravis, chronic lumbar surgical history.    Examination-Activity Limitations Lift;Squat;Bend;Stairs;Stand;Transfers;Insurance claims handler;Bathing;Hygiene/Grooming;Dressing;Toileting    Examination-Participation Restrictions Yard Work;Church;Cleaning;Driving    Stability/Clinical Decision Making Stable/Uncomplicated    Rehab Potential Good    Clinical Impairments  Affecting Rehab Potential PMH    PT Frequency 2x / week    PT Duration 12 weeks    PT Treatment/Interventions ADLs/Self Care Home Management;Electrical Stimulation;Moist Heat;Traction;Ultrasound;Gait training;Stair training;Functional mobility training;Therapeutic activities;Therapeutic exercise;Balance training;Neuromuscular re-education;Patient/family education;Manual techniques;Passive range of motion;Dry needling;Joint Manipulations;Cryotherapy    PT Next Visit Plan balance, strength; progress standing balance, dynamic balance, continue POC as previously indicated    PT Home Exercise Plan Reviewed seated LE strengthening exercises- patient to continue with these until next visit    Consulted and Agree with Plan of Care Patient           Patient will benefit from skilled therapeutic intervention in order to improve the following deficits and impairments:  Abnormal gait,Decreased balance,Decreased mobility,Decreased endurance,Difficulty walking,Hypomobility,Impaired sensation,Decreased range of motion,Impaired perceived functional ability,Decreased activity tolerance,Decreased coordination,Decreased strength,Impaired flexibility,Postural dysfunction,Pain  Visit Diagnosis: Abnormality of gait and mobility  Difficulty in walking, not elsewhere classified  Muscle weakness (generalized)     Problem List Patient Active Problem List   Diagnosis Date Noted  . History of decompression of median nerve 01/01/2021  . Paroxysmal atrial fibrillation ([place]) 10/06/2020  . Carotid stenosis, right 09/17/2020  . S/P cervical spinal fusion   . Leukocytosis   . Essential hypertension   . Anemia of chronic disease   . Postoperative pain   . Neuropathic pain   . Cervical myelopathy ([place]) 08/07/2020  . Preop cardiovascular exam 07/17/2020  . SOB (shortness of breath) on exertion 07/17/2020  . Leg weakness, bilateral 07/13/2020  . Aortic atherosclerosis ([place]) 07/09/2020  . Body mass index (BMI)  34.0-34.9, adult 12/25/2019  . Myalgia 10/30/2019  .  Lumbar post-laminectomy syndrome 10/24/2019  . PAD (peripheral artery disease) ([place]) 06/06/2019  . CKD (chronic kidney disease) stage 3, GFR 30-59 ml/min (HCC) 04/29/2019  . B12 deficiency 01/23/2019  . Left arm numbness 02/28/2018  . Neck pain 02/28/2018  . Anemia 02/02/2017  . DDD (degenerative disc disease), cervical 02/02/2017  . Hypothyroid 02/02/2017  . MRSA (methicillin resistant staph aureus) culture positive 02/02/2017  . Nocturnal hypoxia 02/02/2017  . Senile purpura ([place]) 10/03/2016  . Essential hypertension, benign 09/16/2016  . Bilateral carotid artery disease ([place]) 09/16/2016  . 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/18/2016  . 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 History of prostate cancer 12/21/2015  . Left carpal tunnel syndrome 11/11/2015  . Kidney stone on left side 07/05/2015  . Myasthenia gravis ([place]) 05/26/2015  . Radiculitis 01/05/2015  . Long-term use of high-risk medication 10/15/2014  . Persistent cough 09/10/2014  . Pure hypercholesterolemia 07/25/2014  . Spinal stenosis, lumbar region, with neurogenic claudication 02/24/2014  . Lumbosacral stenosis with neurogenic claudication ([place]) 02/24/2014  . COPD (chronic obstructive pulmonary disease) ([place]) 01/22/2014    Lewis Moccasin, PT 02/16/2021, 4:56 PM  [place] MAIN [facility] SERVICES [address] [place], [place], [address] Phone: [phone]   Fax:  [phone]  Name: BRADIE LACOCK MRN: [id number] Date of Birth: 11/18/1945

## 2021-02-18 ENCOUNTER — Encounter: Payer: Medicare Other | Admitting: Occupational Therapy

## 2021-02-18 ENCOUNTER — Ambulatory Visit: Payer: Medicare Other

## 2021-02-18 ENCOUNTER — Other Ambulatory Visit: Payer: Self-pay

## 2021-02-18 DIAGNOSIS — R262 Difficulty in walking, not elsewhere classified: Secondary | ICD-10-CM

## 2021-02-18 DIAGNOSIS — R278 Other lack of coordination: Secondary | ICD-10-CM

## 2021-02-18 DIAGNOSIS — R269 Unspecified abnormalities of gait and mobility: Secondary | ICD-10-CM | POA: Diagnosis not present

## 2021-02-18 DIAGNOSIS — M6281 Muscle weakness (generalized): Secondary | ICD-10-CM

## 2021-02-18 NOTE — Therapy (Signed)
[place] MAIN [facility] SERVICES [address] [place], [place], [address] Phone: [phone]   Fax:  [phone]  Physical Therapy Treatment  Patient Details  Name: Francisco [name] MRN: [id number] Date of Birth: 1945/12/05 Referring Provider (PT): Lauraine Rinne, PA-C   Encounter Date: 02/18/2021   PT End of Session - 02/18/21 1210     Visit Number 35    Number of Visits 50    Date for PT Re-Evaluation 03/23/21    Authorization Type [employer] Medicare;  3/10; PN 01/28/2021    Authorization Time Period 08/25/20-11/17/20; 10/06/20-12/29/20; 12/29/2020- 03/23/2021    PT Start Time 1450    PT Stop Time 1520    PT Time Calculation (min) 30 min    Equipment Utilized During Treatment Gait belt    Activity Tolerance Patient tolerated treatment well;No increased pain;Patient limited by fatigue    Behavior During Therapy [facility] for tasks assessed/performed             Past Medical History:  Diagnosis Date   Arthritis    lower left hip   Atypical angina (HCC)    Bilateral hand numbness    from back surgery   Bronchitis, chronic (HCC)    Cancer ([place])    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; Merkel cell cancer, and Basal cell cancer (twice; 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   Carotid stenosis    CKD (chronic kidney disease) stage 3, GFR 30-59 ml/min (HCC)    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   DDD (degenerative disc disease), cervical    Hypercholesterolemia    Hypertension    Hypothyroidism    pt takes Levothyroxine daily   Lumbosacral spinal stenosis    Myasthenia gravis, adult form ([place])    PAD (peripheral artery disease) (HCC)    Shortness of breath    Lung MD- Dr Darlin Coco   Sleep apnea    do not use CPAP every night    Past Surgical History:  Procedure Laterality Date   ANTERIOR CERVICAL DECOMP/DISCECTOMY FUSION  07/18/2011   Procedure: ANTERIOR CERVICAL DECOMPRESSION/DISCECTOMY FUSION 2 LEVELS;  Surgeon: Cooper Render Pool;  Location:  [place] NEURO ORS;  Service: Neurosurgery;  Laterality: N/A;  cervical five-six, cervical six-seven anterior cervical discectomy and fusion   BACK SURGERY     in [place]     01/2020 Right, 04/2020 Left   CARDIAC CATHETERIZATION     2005 at [facility], no stents   CAROTID PTA/STENT INTERVENTION N/A 09/17/2020   Procedure: CAROTID PTA/STENT INTERVENTION;  Surgeon: Algernon Huxley, MD;  Location: [place] CV LAB;  Service: Cardiovascular;  Laterality: N/A;   CATARACT EXTRACTION W/PHACO Left 01/06/2020   Procedure: CATARACT EXTRACTION PHACO AND INTRAOCULAR LENS PLACEMENT (IOC) ISTENT INJ LEFT 3.81  00:33.3;  Surgeon: Eulogio Bear, MD;  Location: [place];  Service: Ophthalmology;  Laterality: Left;   CATARACT EXTRACTION W/PHACO Right 02/03/2020   Procedure: CATARACT EXTRACTION PHACO AND INTRAOCULAR LENS PLACEMENT ([place]) RIGHT ISTENT INJ;  Surgeon: Eulogio Bear, MD;  Location: [place];  Service: Ophthalmology;  Laterality: Right;  4.29 0:35.6   COLONOSCOPY     HERNIA REPAIR Left    inguinal hernia repair in 1985   LUMBAR LAMINECTOMY/DECOMPRESSION MICRODISCECTOMY Left 02/24/2014   Procedure: LUMBAR LAMINECTOMY/DECOMPRESSION MICRODISCECTOMY LUMBAR THREE-FOUR, FOUR-FIVE, LEFT FIVE-SACRAL ONE ;  Surgeon: Charlie Pitter, MD;  Location: [place] NEURO ORS;  Service: Neurosurgery;  Laterality: Left;  LUMBAR  LAMINECTOMY/DECOMPRESSION MICRODISCECTOMY LUMBAR THREE-FOUR, FOUR-FIVE, LEFT FIVE-SACRAL ONE    POSTERIOR CERVICAL FUSION/FORAMINOTOMY N/A 08/07/2020   Procedure: C3-6 POSTERIOR FUSION WITH DECOMPRESSION;  Surgeon: Meade Maw, MD;  Location: [facility] ORS;  Service: Neurosurgery;  Laterality: N/A;   PROSTATECTOMY  8/14   [facility] Dr Mare Ferrari     There were no vitals filed for this visit.   Neruomuscular re-education:   Tap hedgehog (3) in // bars - placed 3 disc on floor and patient lifted his right then left leg to place on each disc for  3 sets without UE support except occasional reaching for bar with UE support.    Step up onto purple pad with 1UE support x 5 each leg then 3 each without UE support.  *Increased unsteadiness requiring increased UE support  Forward Step over hedgehogs (3) in //bars- right LE in one direction and then left LE in opp direction. *Difficulty with unsteadiness yet able to accomplish. Limited due to fatigue x 4 trials.   Side step over 3 hedgehogs in // bars- Fingertip touch of B hands on bar for balance - down and back x 5 trials.   Standing in // bars- hip march using GTB (knee up toward bar level) x 12 reps each. - attempting with NO UE support- Patient verbalized as very challenging.   *Treatment ended early secondary to patient having a dermatology appointment.   Education provided throughout session via VC/TC and demonstration to facilitate movement at target joints and correct muscle activation for all testing and exercises performed.   Clinical Impression: Treatment limited secondary to patient arriving late and needed to leave early due to medical appointment. He overall performed better -able to resume LE strengthening/standing balance activities. He was challenged with no UE support and difficulty maintaining balance today requiring frequent use of hands for support but overall better than last visit. Pt will continue to benefit from further skilled therapy to improve balance and LE strength to improve ease with ADLs and QOL  Subjective Assessment - 02/18/21 1207     Subjective Patient reports "Don't know what I did different but my legs feel better today and I have been able to do more walking and daily activities today. Dr. Annette Stable is setting me up for more MRI from my head down to see what is going on with this weakness."    Pertinent History Francisco [name] is a 75yoM referred to OPPT neuro s/p myelopathy s/p cervical decompression and fusion. Pt sustained several months of general decline  in mobility prior to surgical decompression. Pt went to inpatient rehab after hospitalization. [facility].  Pt underwent C3-6 decompression, fusion on 11/26. PMH: myasthenia gravis, 2012 ACDF 5/6, 6/7; OSA, hypothyroidism, HTN, COPD, multiple back surgeries, 2021 carpal tunnel release, referred to OPOT, but only seen for evaluation. 8/10 OPPT evaluation for LSS c radiating pain into the left hip, only completed evaluation session. September - November progressive decline in leg strength and tolerance to upright activity. Also noted a decline in tool manipulation for ADL (pen and meal utensil). Seen by OT and PT as an outpatient for other potential etiology (Carpal tunnel syndrome, and lumbar spinal stenosis), but eventually identified to be related to cervical myelopathy. Several months prior has no limitations in mobility or function. Pt scheduled for Jan 6th: Carotid endarterectomy c Dr. Leotis Pain. Pt has a long history of lumbar DJD, s/p L5/S1 decompression discectomy x2, L5 nerve root injections. Most recent Lumbar MRI (11/10/18) revealin gof advanced formaminal  stenosis of L3/4, L4/5, L5/S1. Pt is has had LLE neurological weakness and pain since 2015 and has been followed by neurosurgery.    Limitations Walking;Lifting;Standing;House hold activities    How long can you sit comfortably? Not limited    How long can you stand comfortably? 3-5 minutes    How long can you walk comfortably? 10 minutes    Patient Stated Goals return to functional baseline status of Summer 2021.    Currently in Pain? No/denies                                       PT Education - 02/19/21 1209     Education provided Yes    Education Details Exercise form and balance training.    Person(s) Educated Patient    Methods Explanation;Demonstration;Tactile cues;Verbal cues    Comprehension Verbalized understanding;Verbal cues required;Returned demonstration;Tactile cues  required;Need further instruction              PT Short Term Goals - 12/08/20 1357       PT SHORT TERM GOAL #1   Title After 6 weeks patient will be independent in HEP to progress strength, AMB, and balance.    Baseline 2/9: HEP compliant 3/29: HEP compliant    Time 6    Period Weeks    Status Achieved    Target Date 11/17/20      PT SHORT TERM GOAL #2   Title After 6 weeks pt will demonstrate improved 5xSTS hands free chair + airex foam in <20sec    Baseline 01/25: 31s hands free without airex foam 2/9: unable to do on airex 3/29 12.03 seconds no UE support required    Time 6    Period Weeks    Status Achieved    Target Date --               PT Long Term Goals - 01/28/21 1551       PT LONG TERM GOAL #1   Title Patient will reduce timed up and go to <11 seconds to reduce fall risk and demonstrate improved transfer/gait ability.    Baseline 01/25: 30.26s 2/9: 19.55 seconds with RW 3/29: 12.36 seconds with RW.   12/29/2020= 12:15 sec with RW; 01/28/2021= 16 sec with use of SPC    Time 12    Period Weeks    Status On-going    Target Date 03/23/21      PT LONG TERM GOAL #2   Title Patient will demonstrate 6MWT (LRAD) >784ft to demonstrate improved capacity to access the community.    Baseline 12/16: 120 ft, 01/25: 165 ft with 1 seated rest break for 2 mins 2/9: 395 ft with RW and one seated rest break 3/29: 520 ft with RW and one seated rest break. 12/29/2020= 550 feet using RW. 01/28/2021= 185 feet with use of SPC at 1 min 38 sec    Time 12    Period Weeks    Status On-going    Target Date 03/23/21      PT LONG TERM GOAL #3   Title Patient will increase 10 meter walk test to >1.14m/s as to improve gait speed for better community ambulation and to reduce fall risk.    Baseline 01/25: 0.24m/s 2/9: 0.81 m/s with RW 3/29: 0.91 m/s with RW. 12/29/2020=0.91 m/s with RW. 01/28/2021= 0.75 m/s with use of SPC    Time 12  Period Weeks    Status On-going    Target Date  03/23/21      PT LONG TERM GOAL #4   Title After 12 weeks pt to demonstrate improved FOTO score >70    Baseline 01/25: 51 2/9: 55.97 % 3/29: 62.9% 12/29/2020= 56%    Time 12    Period Weeks    Status On-going      PT LONG TERM GOAL #5   Title Patient (> 8 years old) will complete five times sit to stand test in <12.5 seconds indicating an increased LE strength and improved balance.    Baseline 01/25: 31s;  2/9: hands on knees 13.09; 13.16sec hands-free 3/29: 12.03 seconds no UE support.    Time 12    Period Weeks    Status Achieved                   Plan - 02/18/21 1211     Clinical Impression Statement Treatment limited secondary to patient arriving late and needed to leave early due to medical appointment. He overall performed better -able to resume LE strengthening/standing balance activities. He was challenged with no UE support and difficulty maintaining balance today requiring frequent use of hands for support but overall better than last visit. Pt will continue to benefit from further skilled therapy to improve balance and LE strength to improve ease with ADLs and QOL    Personal Factors and Comorbidities Age;Comorbidity 2;Past/Current Experience;Time since onset of injury/illness/exacerbation    Comorbidities COPD, History of Cancer, myasthenia gravis, chronic lumbar surgical history.    Examination-Activity Limitations Lift;Squat;Bend;Stairs;Stand;Transfers;Insurance claims handler;Bathing;Hygiene/Grooming;Dressing;Toileting    Examination-Participation Restrictions Yard Work;Church;Cleaning;Driving    Stability/Clinical Decision Making Stable/Uncomplicated    Rehab Potential Good    Clinical Impairments Affecting Rehab Potential PMH    PT Frequency 2x / week    PT Duration 12 weeks    PT Treatment/Interventions ADLs/Self Care Home Management;Electrical Stimulation;Moist Heat;Traction;Ultrasound;Gait training;Stair training;Functional mobility  training;Therapeutic activities;Therapeutic exercise;Balance training;Neuromuscular re-education;Patient/family education;Manual techniques;Passive range of motion;Dry needling;Joint Manipulations;Cryotherapy    PT Next Visit Plan balance, strength; progress standing balance, dynamic balance, continue POC as previously indicated    Consulted and Agree with Plan of Care Patient             Patient will benefit from skilled therapeutic intervention in order to improve the following deficits and impairments:  Abnormal gait, Decreased balance, Decreased mobility, Decreased endurance, Difficulty walking, Hypomobility, Impaired sensation, Decreased range of motion, Impaired perceived functional ability, Decreased activity tolerance, Decreased coordination, Decreased strength, Impaired flexibility, Postural dysfunction, Pain  Visit Diagnosis: Abnormality of gait and mobility  Difficulty in walking, not elsewhere classified  Muscle weakness (generalized)  Other lack of coordination     Problem List Patient Active Problem List   Diagnosis Date Noted   History of decompression of median nerve 01/01/2021   Paroxysmal atrial fibrillation ([place]) 10/06/2020   Carotid stenosis, right 09/17/2020   S/P cervical spinal fusion    Leukocytosis    Essential hypertension    Anemia of chronic disease    Postoperative pain    Neuropathic pain    Cervical myelopathy ([place]) 08/07/2020   Preop cardiovascular exam 07/17/2020   SOB (shortness of breath) on exertion 07/17/2020   Leg weakness, bilateral 07/13/2020   Aortic atherosclerosis ([place]) 07/09/2020   Body mass index (BMI) 34.0-34.9, adult 12/25/2019   Myalgia 10/30/2019   Lumbar post-laminectomy syndrome 10/24/2019   PAD (peripheral artery disease) ([place]) 06/06/2019   CKD (chronic kidney disease) stage  3, GFR 30-59 ml/min (HCC) 04/29/2019   B12 deficiency 01/23/2019   Left arm numbness 02/28/2018   Neck pain 02/28/2018   Anemia 02/02/2017    DDD (degenerative disc disease), cervical 02/02/2017   Hypothyroid 02/02/2017   MRSA (methicillin resistant staph aureus) culture positive 02/02/2017   Nocturnal hypoxia 02/02/2017   Senile purpura ([place]) 10/03/2016   Essential hypertension, benign 09/16/2016   Bilateral carotid artery disease ([place]) 09/16/2016   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/18/2016   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   History of prostate cancer 12/21/2015   Left carpal tunnel syndrome 11/11/2015   Kidney stone on left side 07/05/2015   Myasthenia gravis ([place]) 05/26/2015   Radiculitis 01/05/2015   Long-term use of high-risk medication 10/15/2014   Persistent cough 09/10/2014   Pure hypercholesterolemia 07/25/2014   Spinal stenosis, lumbar region, with neurogenic claudication 02/24/2014   Lumbosacral stenosis with neurogenic claudication ([place]) 02/24/2014   COPD (chronic obstructive pulmonary disease) ([place]) 01/22/2014    Lewis Moccasin, PT 02/19/2021, 12:22 PM  [place] MAIN [facility] SERVICES [address] [place], [place], [address] Phone: [phone]   Fax:  [phone]  Name: Francisco [name] MRN: [id number] Date of Birth: Jan 23, 1946

## 2021-02-19 ENCOUNTER — Other Ambulatory Visit: Payer: Self-pay | Admitting: Neurosurgery

## 2021-02-19 DIAGNOSIS — M5416 Radiculopathy, lumbar region: Secondary | ICD-10-CM

## 2021-02-22 ENCOUNTER — Other Ambulatory Visit: Payer: Self-pay | Admitting: Physical Medicine and Rehabilitation

## 2021-02-23 ENCOUNTER — Encounter: Payer: Medicare Other | Admitting: Occupational Therapy

## 2021-02-23 ENCOUNTER — Ambulatory Visit: Payer: Medicare Other

## 2021-02-25 ENCOUNTER — Other Ambulatory Visit: Payer: Self-pay

## 2021-02-25 ENCOUNTER — Encounter: Payer: Medicare Other | Admitting: Occupational Therapy

## 2021-02-25 ENCOUNTER — Ambulatory Visit: Payer: Medicare Other

## 2021-02-25 DIAGNOSIS — R262 Difficulty in walking, not elsewhere classified: Secondary | ICD-10-CM

## 2021-02-25 DIAGNOSIS — R269 Unspecified abnormalities of gait and mobility: Secondary | ICD-10-CM

## 2021-02-25 DIAGNOSIS — M6281 Muscle weakness (generalized): Secondary | ICD-10-CM

## 2021-02-25 DIAGNOSIS — R2689 Other abnormalities of gait and mobility: Secondary | ICD-10-CM

## 2021-02-25 NOTE — Therapy (Signed)
[name] [address] [place], [place], [address] Phone: [phone]   Fax:  [phone]  Physical Therapy Treatment  Patient Details  Name: Francisco [name] MRN: [id number] Date of Birth: [birth year] Referring Provider (PT): Francisco Rinne, PA-C   Encounter Date: 02/25/2021   PT End of Session - 02/25/21 1456     Visit Number 36    Number of Visits 50    Date for PT Re-Evaluation 03/23/21    Authorization Type [employer] Medicare;  76/10; PN 01/28/2021    Authorization Time Period 08/25/20-11/17/20; 10/06/20-12/29/20; 12/29/2020- 03/23/2021    PT Start Time 1448    PT Stop Time 1529    PT Time Calculation (min) 41 min    Equipment Utilized During Treatment Gait belt    Activity Tolerance Patient tolerated treatment well;No increased [name];Patient limited by fatigue    Behavior During Therapy Francisco [name] for tasks assessed/performed             Past Medical History:  Diagnosis Date   Arthritis    lower left hip   Atypical angina (HCC)    Bilateral hand numbness    from back surgery   Bronchitis, chronic (HCC)    Cancer ([place])    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; Merkel cell cancer, and Basal cell cancer (twice; 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   Carotid stenosis    CKD (chronic kidney disease) stage 3, GFR 30-59 ml/min (HCC)    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   DDD (degenerative disc disease), cervical    Hypercholesterolemia    Hypertension    Hypothyroidism    pt takes Levothyroxine daily   Lumbosacral spinal stenosis    Myasthenia gravis, adult form ([place])    PAD (peripheral artery disease) (HCC)    Shortness of breath    Lung MD- Dr Francisco [name]   Sleep apnea    do not use CPAP every night    Past Surgical History:  Procedure Laterality Date   ANTERIOR CERVICAL DECOMP/DISCECTOMY FUSION  07/18/2011   Procedure: ANTERIOR CERVICAL DECOMPRESSION/DISCECTOMY FUSION 2 LEVELS;  Surgeon: Francisco [name];  Location:  [facility];  Service: Neurosurgery;  Laterality: N/A;  cervical five-six, cervical six-seven anterior cervical discectomy and fusion   BACK SURGERY     in [place]     01/2020 Right, 04/2020 Left   CARDIAC CATHETERIZATION     2005 at [name], no stents   CAROTID PTA/STENT INTERVENTION N/A 09/17/2020   Procedure: CAROTID PTA/STENT INTERVENTION;  Surgeon: Francisco Huxley, MD;  Location: [facility];  Service: Cardiovascular;  Laterality: N/A;   CATARACT EXTRACTION W/PHACO Left 01/06/2020   Procedure: CATARACT EXTRACTION PHACO AND INTRAOCULAR LENS PLACEMENT (IOC) ISTENT INJ LEFT 3.81  00:33.3;  Surgeon: Francisco Bear, MD;  Location: [facility];  Service: Ophthalmology;  Laterality: Left;   CATARACT EXTRACTION W/PHACO Right 02/03/2020   Procedure: CATARACT EXTRACTION PHACO AND INTRAOCULAR LENS PLACEMENT ([place]) RIGHT ISTENT INJ;  Surgeon: Francisco Bear, MD;  Location: [facility];  Service: Ophthalmology;  Laterality: Right;  4.29 0:35.6   COLONOSCOPY     HERNIA REPAIR Left    inguinal hernia repair in 1985   LUMBAR LAMINECTOMY/DECOMPRESSION MICRODISCECTOMY Left 02/24/2014   Procedure: LUMBAR LAMINECTOMY/DECOMPRESSION MICRODISCECTOMY LUMBAR THREE-FOUR, FOUR-FIVE, LEFT FIVE-SACRAL ONE ;  Surgeon: Francisco Pitter, MD;  Location: [facility];  Service: Neurosurgery;  Laterality: Left;  LUMBAR  LAMINECTOMY/DECOMPRESSION MICRODISCECTOMY LUMBAR THREE-FOUR, FOUR-FIVE, LEFT FIVE-SACRAL ONE    POSTERIOR CERVICAL FUSION/FORAMINOTOMY N/A 08/07/2020   Procedure: C3-6 POSTERIOR FUSION WITH DECOMPRESSION;  Surgeon: Francisco Maw, MD;  Location: [facility];  Service: Neurosurgery;  Laterality: N/A;   PROSTATECTOMY  8/14   [facility] Dr Francisco [name]     There were no vitals filed for this visit.   Subjective Assessment - 02/25/21 1452     Subjective Patient reports still waiting to schedule his MRI. Reports ongoing weakness yet no [name].     Pertinent History Francisco [name] is a 75yoM referred to OPPT neuro s/p myelopathy s/p cervical decompression and fusion. Pt sustained several months of general decline in mobility prior to surgical decompression. Pt went to inpatient rehab after hospitalization. [facility].  Pt underwent C3-6 decompression, fusion on 11/26. PMH: myasthenia gravis, 2012 ACDF 5/6, 6/7; OSA, hypothyroidism, HTN, COPD, multiple back surgeries, 2021 carpal tunnel release, referred to OPOT, but only seen for evaluation. 8/10 OPPT evaluation for LSS c radiating [name] into the left hip, only completed evaluation session. September - November progressive decline in leg strength and tolerance to upright activity. Also noted a decline in tool manipulation for ADL (pen and meal utensil). Seen by OT and PT as an outpatient for other potential etiology (Carpal tunnel syndrome, and lumbar spinal stenosis), but eventually identified to be related to cervical myelopathy. Several months prior has no limitations in mobility or function. Pt scheduled for Jan 6th: Carotid endarterectomy c Dr. Leotis [name]. Pt has a long history of lumbar DJD, s/p L5/S1 decompression discectomy x2, L5 nerve root injections. Most recent Lumbar MRI (11/10/18) revealin gof advanced formaminal stenosis of L3/4, L4/5, L5/S1. Pt is has had LLE neurological weakness and [name] since 2015 and has been followed by neurosurgery.    Limitations Walking;Lifting;Standing;House hold activities    How long can you sit comfortably? Not limited    How long can you stand comfortably? 3-5 minutes    How long can you walk comfortably? 10 minutes    Patient Stated Goals return to functional baseline status of Summer 2021.    Currently in [name]? No/denies               Nustep: L3 5 min 0.25 mi. Cues for positioning. Patient moving with SPM>80 without report of [name] or SOB.  Side step over orange hurdle x 15 reps - Mild difficulty initially  requiring BUE  Support - able to improve to just fingertip touch and no loss of balance.   Standing at support bar focusing on heel to toe B LE x 20 reps each.   Static standing at support bar on 1/2 white foam roll (curved side up)  - feet wide - mild difficulty initially yet improved with practice. Hold 20 sec x 2 sets  Static standing at support bar on 1/2 white foam roll (curved side down) - feet wide- Increased sway and able to hold 10-20 sec with practice.   Static standing at support bar on 1/2 white foam roll (curved side up)  - feet narrowed- patient able to hold for 20 sec x 2 sets without loss of balance- mostly ankle strategy.   Static standing at support bar on 1/2 white foam roll (curved side down) - feet narrowed - Patient with more difficulty and much more sway- falling backward several times requiring CGA to maintain balance.   Education provided throughout session via VC/TC and demonstration to facilitate movement at target joints and correct  muscle activation for all testing and exercises performed.   Clinical Impression: Patient performed well with all standing activities today - able to progress balance to working with 1/2 foam roll to focus on using ankle/hip strategies- Patient performed well with VC and some unsteadiness with more progressive activities. Pt will continue to benefit from further skilled therapy to improve balance and LE strength to improve ease with ADLs and QOL                         PT Education - 02/25/21 1454     Education provided Yes    Education Details specific exercise and balance training    Person(s) Educated Patient    Methods Explanation;Demonstration;Tactile cues;Verbal cues    Comprehension Verbalized understanding;Returned demonstration;Verbal cues required;Need further instruction;Tactile cues required              PT Short Term Goals - 12/08/20 1357       PT SHORT TERM GOAL #1   Title After 6 weeks patient will be  independent in HEP to progress strength, AMB, and balance.    Baseline 2/9: HEP compliant 3/29: HEP compliant    Time 6    Period Weeks    Status Achieved    Target Date 11/17/20      PT SHORT TERM GOAL #2   Title After 6 weeks pt will demonstrate improved 5xSTS hands free chair + airex foam in <20sec    Baseline 01/25: 31s hands free without airex foam 2/9: unable to do on airex 3/29 12.03 seconds no UE support required    Time 6    Period Weeks    Status Achieved    Target Date --               PT Long Term Goals - 01/28/21 1551       PT LONG TERM GOAL #1   Title Patient will reduce timed up and go to <11 seconds to reduce fall risk and demonstrate improved transfer/gait ability.    Baseline 01/25: 30.26s 2/9: 19.55 seconds with RW 3/29: 12.36 seconds with RW.   12/29/2020= 12:15 sec with RW; 01/28/2021= 16 sec with use of SPC    Time 12    Period Weeks    Status On-going    Target Date 03/23/21      PT LONG TERM GOAL #2   Title Patient will demonstrate 6MWT (LRAD) >771ft to demonstrate improved capacity to access the community.    Baseline 12/16: 120 ft, 01/25: 165 ft with 1 seated rest break for 2 mins 2/9: 395 ft with RW and one seated rest break 3/29: 520 ft with RW and one seated rest break. 12/29/2020= 550 feet using RW. 01/28/2021= 185 feet with use of SPC at 1 min 38 sec    Time 12    Period Weeks    Status On-going    Target Date 03/23/21      PT LONG TERM GOAL #3   Title Patient will increase 10 meter walk test to >1.71m/s as to improve gait speed for better community ambulation and to reduce fall risk.    Baseline 01/25: 0.39m/s 2/9: 0.81 m/s with RW 3/29: 0.91 m/s with RW. 12/29/2020=0.91 m/s with RW. 01/28/2021= 0.75 m/s with use of SPC    Time 12    Period Weeks    Status On-going    Target Date 03/23/21      PT LONG TERM GOAL #4  Title After 12 weeks pt to demonstrate improved FOTO score >70    Baseline 01/25: 51 2/9: 55.97 % 3/29: 62.9% 12/29/2020= 56%     Time 12    Period Weeks    Status On-going      PT LONG TERM GOAL #5   Title Patient (> 57 years old) will complete five times sit to stand test in <12.5 seconds indicating an increased LE strength and improved balance.    Baseline 01/25: 31s;  2/9: hands on knees 13.09; 13.16sec hands-free 3/29: 12.03 seconds no UE support.    Time 12    Period Weeks    Status Achieved                   Plan - 02/25/21 1457     Clinical Impression Statement Patient performed well with all standing activities today - able to progress balance to working with 1/2 foam roll to focus on using ankle/hip strategies- Patient performed well with VC and some unsteadiness with more progressive activities. Pt will continue to benefit from further skilled therapy to improve balance and LE strength to improve ease with ADLs and QOL    Personal Factors and Comorbidities Age;Comorbidity 2;Past/Current Experience;Time since onset of injury/illness/exacerbation    Comorbidities COPD, History of Cancer, myasthenia gravis, chronic lumbar surgical history.    Examination-Activity Limitations Lift;Squat;Bend;Stairs;Stand;Transfers;Insurance claims handler;Bathing;Hygiene/Grooming;Dressing;Toileting    Examination-Participation Restrictions Yard Work;Church;Cleaning;Driving    Stability/Clinical Decision Making Stable/Uncomplicated    Rehab Potential Good    Clinical Impairments Affecting Rehab Potential PMH    PT Frequency 2x / week    PT Duration 12 weeks    PT Treatment/Interventions ADLs/Self Care Home Management;Electrical Stimulation;Moist Heat;Traction;Ultrasound;Gait training;Stair training;Functional mobility training;Therapeutic activities;Therapeutic exercise;Balance training;Neuromuscular re-education;Patient/family education;Manual techniques;Passive range of motion;Dry needling;Joint Manipulations;Cryotherapy    PT Next Visit Plan balance, strength; progress standing balance, dynamic  balance, continue POC as previously indicated    Consulted and Agree with Plan of Care Patient             Patient will benefit from skilled therapeutic intervention in order to improve the following deficits and impairments:  Abnormal gait, Decreased balance, Decreased mobility, Decreased endurance, Difficulty walking, Hypomobility, Impaired sensation, Decreased range of motion, Impaired perceived functional ability, Decreased activity tolerance, Decreased coordination, Decreased strength, Impaired flexibility, Postural dysfunction, [name]  Visit Diagnosis: Abnormality of gait and mobility  Difficulty in walking, not elsewhere classified  Muscle weakness (generalized)  Other abnormalities of gait and mobility     Problem List Patient Active Problem List   Diagnosis Date Noted   History of decompression of median nerve 01/01/2021   Paroxysmal atrial fibrillation ([place]) 10/06/2020   Carotid stenosis, right 09/17/2020   S/P cervical spinal fusion    Leukocytosis    Essential hypertension    Anemia of chronic disease    Postoperative [name]    Neuropathic [name]    Cervical myelopathy ([place]) 08/07/2020   Preop cardiovascular exam 07/17/2020   SOB (shortness of breath) on exertion 07/17/2020   Leg weakness, bilateral 07/13/2020   Aortic atherosclerosis ([place]) 07/09/2020   Body mass index (BMI) 34.0-34.9, adult 12/25/2019   Myalgia 10/30/2019   Lumbar post-laminectomy syndrome 10/24/2019   PAD (peripheral artery disease) ([place]) 06/06/2019   CKD (chronic kidney disease) stage 3, GFR 30-59 ml/min (HCC) 04/29/2019   B12 deficiency 01/23/2019   Left arm numbness 02/28/2018   Neck [name] 02/28/2018   Anemia 02/02/2017   DDD (degenerative disc disease), cervical 02/02/2017   Hypothyroid 02/02/2017  MRSA (methicillin resistant staph aureus) culture positive 02/02/2017   Nocturnal hypoxia 02/02/2017   Senile purpura ([place]) 10/03/2016   Essential hypertension, benign 09/16/2016    Bilateral carotid artery disease ([place]) 09/16/2016   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/18/2016   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   History of prostate cancer 12/21/2015   Left carpal tunnel syndrome 11/11/2015   Kidney stone on left side 07/05/2015   Myasthenia gravis ([place]) 05/26/2015   Radiculitis 01/05/2015   Long-term use of high-risk medication 10/15/2014   Persistent cough 09/10/2014   Pure hypercholesterolemia 07/25/2014   Spinal stenosis, lumbar region, with neurogenic claudication 02/24/2014   Lumbosacral stenosis with neurogenic claudication ([place]) 02/24/2014   COPD (chronic obstructive pulmonary disease) ([place]) 01/22/2014    Lewis Moccasin, PT 02/25/2021, 5:25 PM  [name] [address] [place], [place], [address] Phone: [phone]   Fax:  [phone]  Name: Francisco [name] MRN: [id number] Date of Birth: January 08, 1946

## 2021-03-02 ENCOUNTER — Encounter: Payer: Medicare Other | Admitting: Occupational Therapy

## 2021-03-02 ENCOUNTER — Other Ambulatory Visit: Payer: Self-pay

## 2021-03-02 ENCOUNTER — Ambulatory Visit: Payer: Medicare Other

## 2021-03-02 DIAGNOSIS — R278 Other lack of coordination: Secondary | ICD-10-CM

## 2021-03-02 DIAGNOSIS — R269 Unspecified abnormalities of gait and mobility: Secondary | ICD-10-CM | POA: Diagnosis not present

## 2021-03-02 DIAGNOSIS — R262 Difficulty in walking, not elsewhere classified: Secondary | ICD-10-CM

## 2021-03-02 DIAGNOSIS — M6281 Muscle weakness (generalized): Secondary | ICD-10-CM

## 2021-03-02 NOTE — Therapy (Signed)
[place] MAIN [facility] SERVICES [address] [place], [place], [address] Phone: [phone]   Fax:  [phone]  Physical Therapy Treatment  Patient Details  Name: Francisco [name] MRN: [id number] Date of Birth: 11/07/1945 Referring Provider (PT): Lauraine Rinne, PA-C   Encounter Date: 03/02/2021   PT End of Session - 03/02/21 0835     Visit Number 37    Number of Visits 50    Date for PT Re-Evaluation 03/23/21    Authorization Type UHC Medicare;  3/10; PN 01/28/2021    Authorization Time Period 08/25/20-11/17/20; 10/06/20-12/29/20; 12/29/2020- 03/23/2021    PT Start Time 0830    PT Stop Time 0914    PT Time Calculation (min) 44 min    Equipment Utilized During Treatment Gait belt    Activity Tolerance Patient tolerated treatment well;No increased pain;Patient limited by fatigue    Behavior During Therapy [facility] for tasks assessed/performed             Past Medical History:  Diagnosis Date   Arthritis    lower left hip   Atypical angina (HCC)    Bilateral hand numbness    from back surgery   Bronchitis, chronic (HCC)    Cancer ([place])    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; Merkel cell cancer, and Basal cell cancer (twice; 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   Carotid stenosis    CKD (chronic kidney disease) stage 3, GFR 30-59 ml/min (HCC)    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   DDD (degenerative disc disease), cervical    Hypercholesterolemia    Hypertension    Hypothyroidism    pt takes Levothyroxine daily   Lumbosacral spinal stenosis    Myasthenia gravis, adult form ([place])    PAD (peripheral artery disease) (HCC)    Shortness of breath    Lung MD- Dr Darlin Coco   Sleep apnea    do not use CPAP every night    Past Surgical History:  Procedure Laterality Date   ANTERIOR CERVICAL DECOMP/DISCECTOMY FUSION  07/18/2011   Procedure: ANTERIOR CERVICAL DECOMPRESSION/DISCECTOMY FUSION 2 LEVELS;  Surgeon: Cooper Render Pool;  Location:  [place] [facility];  Service: Neurosurgery;  Laterality: N/A;  cervical five-six, cervical six-seven anterior cervical discectomy and fusion   BACK SURGERY     in [place]     01/2020 Right, 04/2020 Left   CARDIAC CATHETERIZATION     2005 at [facility], no stents   CAROTID PTA/STENT INTERVENTION N/A 09/17/2020   Procedure: CAROTID PTA/STENT INTERVENTION;  Surgeon: Algernon Huxley, MD;  Location: [place] CV LAB;  Service: Cardiovascular;  Laterality: N/A;   CATARACT EXTRACTION W/PHACO Left 01/06/2020   Procedure: CATARACT EXTRACTION PHACO AND INTRAOCULAR LENS PLACEMENT (IOC) ISTENT INJ LEFT 3.81  00:33.3;  Surgeon: Eulogio Bear, MD;  Location: [place];  Service: Ophthalmology;  Laterality: Left;   CATARACT EXTRACTION W/PHACO Right 02/03/2020   Procedure: CATARACT EXTRACTION PHACO AND INTRAOCULAR LENS PLACEMENT ([place]) RIGHT ISTENT INJ;  Surgeon: Eulogio Bear, MD;  Location: [place];  Service: Ophthalmology;  Laterality: Right;  4.29 0:35.6   COLONOSCOPY     HERNIA REPAIR Left    inguinal hernia repair in 1985   LUMBAR LAMINECTOMY/DECOMPRESSION MICRODISCECTOMY Left 02/24/2014   Procedure: LUMBAR LAMINECTOMY/DECOMPRESSION MICRODISCECTOMY LUMBAR THREE-FOUR, FOUR-FIVE, LEFT FIVE-SACRAL ONE ;  Surgeon: Charlie Pitter, MD;  Location: [place] [facility];  Service: Neurosurgery;  Laterality: Left;  LUMBAR  LAMINECTOMY/DECOMPRESSION MICRODISCECTOMY LUMBAR THREE-FOUR, FOUR-FIVE, LEFT FIVE-SACRAL ONE    POSTERIOR CERVICAL FUSION/FORAMINOTOMY N/A 08/07/2020   Procedure: C3-6 POSTERIOR FUSION WITH DECOMPRESSION;  Surgeon: Meade Maw, MD;  Location: [facility] ORS;  Service: Neurosurgery;  Laterality: N/A;   PROSTATECTOMY  8/14   [facility] Dr Mare [name]     There were no vitals filed for this visit.   Subjective Assessment - 03/02/21 0828     Subjective Patient reports feeling pretty decent today.    Pertinent History Francisco [name] is a 89yoM  referred to OPPT neuro s/p myelopathy s/p cervical decompression and fusion. Pt sustained several months of general decline in mobility prior to surgical decompression. Pt went to inpatient rehab after hospitalization. [facility].  Pt underwent C3-6 decompression, fusion on 11/26. PMH: myasthenia gravis, 2012 ACDF 5/6, 6/7; OSA, hypothyroidism, HTN, COPD, multiple back surgeries, 2021 carpal tunnel release, referred to OPOT, but only seen for evaluation. 8/10 OPPT evaluation for LSS c radiating pain into the left hip, only completed evaluation session. September - November progressive decline in leg strength and tolerance to upright activity. Also noted a decline in tool manipulation for ADL (pen and meal utensil). Seen by OT and PT as an outpatient for other potential etiology (Carpal tunnel syndrome, and lumbar spinal stenosis), but eventually identified to be related to cervical myelopathy. Several months prior has no limitations in mobility or function. Pt scheduled for Jan 6th: Carotid endarterectomy c Dr. Leotis Pain. Pt has a long history of lumbar DJD, s/p L5/S1 decompression discectomy x2, L5 nerve root injections. Most recent Lumbar MRI (11/10/18) revealin gof advanced formaminal stenosis of L3/4, L4/5, L5/S1. Pt is has had LLE neurological weakness and pain since 2015 and has been followed by neurosurgery.    Limitations Walking;Lifting;Standing;House hold activities    How long can you sit comfortably? Not limited    How long can you stand comfortably? 3-5 minutes    How long can you walk comfortably? 10 minutes    Patient Stated Goals return to functional baseline status of Summer 2021.    Currently in Pain? No/denies            Interventions:   Dynamic marching on blue airex pad x 1 min x 2 sets .*Patient with difficulty without UE support - no Loss of balance yet increased unsteadiness without UE support.  SLS with opp LE on glide disc- swinging from Hip flex to hip  ext- Patient with max difficulty with balance requiring at least 1 UE support due to unsteadiness- did progress to performing without UE support for a few reps.   Squats to reach for cone in // bars- VC for correct technique to avoid excessive forward lean x 10 reps.   Thoracic rotation with narrowed base of support- standing with narrowed base of support x 10 reps each side- mild unsteadiness yet no loss of balance.   Standing wall posture with chin retraction x 1 min (patient unable to attain back of head against wall but did achieve shoulder against wall and ability to tuck chin.)   Standing wall posture with UE flex using PVC pipe x 10 reps- Patient reports "good stretch to B Shoulders).   Standing lumbar flex/ext at wall- VC for correct technique x 10 reps.   Standing Scap retraction with 12.5 lb 2 sets of 10 reps- patient with mild unsteadiness and reports activity as "medium hard."  Standing horizontal abd with GTB 2 sets of 10 reps. VC and visual demo for  correct technique.    Access Code: [email or web address] URL: [email or web address] Date: 03/02/2021 Prepared by: Sande Brothers  Exercises Wall Angels - 1 x daily - 3 x weekly - 3 sets - 10 reps - 2 hold Standing Trunk Flexion at Wall - 1 x daily - 3 x weekly - 3 sets - 10 reps Squatting Shoulder Row with Anchored Resistance - 1 x daily - 3 x weekly - 3 sets - 10 reps - 2 hold Standing Shoulder Horizontal Abduction with Resistance - 1 x daily - 3 x weekly - 3 sets - 10 reps - 2 hold  Clinical impression: Patient challenged with balance activities and difficulty without UE support due to weakness and imbalance. Also focused on postural strengthening today and patient responded well - fatigued yet able to follow VC for correct technique. Pt will continue to benefit from further skilled therapy to improve balance and LE strength to improve ease with ADLs and QOL                      PT Education - 03/02/21  0828     Education provided Yes    Education Details exercise and balance techniques              PT Short Term Goals - 12/08/20 1357       PT SHORT TERM GOAL #1   Title After 6 weeks patient will be independent in HEP to progress strength, AMB, and balance.    Baseline 2/9: HEP compliant 3/29: HEP compliant    Time 6    Period Weeks    Status Achieved    Target Date 11/17/20      PT SHORT TERM GOAL #2   Title After 6 weeks pt will demonstrate improved 5xSTS hands free chair + airex foam in <20sec    Baseline 01/25: 31s hands free without airex foam 2/9: unable to do on airex 3/29 12.03 seconds no UE support required    Time 6    Period Weeks    Status Achieved    Target Date --               PT Long Term Goals - 01/28/21 1551       PT LONG TERM GOAL #1   Title Patient will reduce timed up and go to <11 seconds to reduce fall risk and demonstrate improved transfer/gait ability.    Baseline 01/25: 30.26s 2/9: 19.55 seconds with RW 3/29: 12.36 seconds with RW.   12/29/2020= 12:15 sec with RW; 01/28/2021= 16 sec with use of SPC    Time 12    Period Weeks    Status On-going    Target Date 03/23/21      PT LONG TERM GOAL #2   Title Patient will demonstrate 6MWT (LRAD) >787ft to demonstrate improved capacity to access the community.    Baseline 12/16: 120 ft, 01/25: 165 ft with 1 seated rest break for 2 mins 2/9: 395 ft with RW and one seated rest break 3/29: 520 ft with RW and one seated rest break. 12/29/2020= 550 feet using RW. 01/28/2021= 185 feet with use of SPC at 1 min 38 sec    Time 12    Period Weeks    Status On-going    Target Date 03/23/21      PT LONG TERM GOAL #3   Title Patient will increase 10 meter walk test to >1.67m/s as to improve gait speed for better community ambulation and to  reduce fall risk.    Baseline 01/25: 0.69m/s 2/9: 0.81 m/s with RW 3/29: 0.91 m/s with RW. 12/29/2020=0.91 m/s with RW. 01/28/2021= 0.75 m/s with use of SPC    Time 12     Period Weeks    Status On-going    Target Date 03/23/21      PT LONG TERM GOAL #4   Title After 12 weeks pt to demonstrate improved FOTO score >70    Baseline 01/25: 51 2/9: 55.97 % 3/29: 62.9% 12/29/2020= 56%    Time 12    Period Weeks    Status On-going      PT LONG TERM GOAL #5   Title Patient (> 35 years old) will complete five times sit to stand test in <12.5 seconds indicating an increased LE strength and improved balance.    Baseline 01/25: 31s;  2/9: hands on knees 13.09; 13.16sec hands-free 3/29: 12.03 seconds no UE support.    Time 12    Period Weeks    Status Achieved                   Plan - 03/02/21 0840     Clinical Impression Statement Patient challenged with balance activities and difficulty without UE support due to weakness and imbalance. Also focused on postural strengthening today and patient responded well - fatigued yet able to follow VC for correct technique. Pt will continue to benefit from further skilled therapy to improve balance and LE strength to improve ease with ADLs and QOL    Personal Factors and Comorbidities Age;Comorbidity 2;Past/Current Experience;Time since onset of injury/illness/exacerbation    Comorbidities COPD, History of Cancer, myasthenia gravis, chronic lumbar surgical history.    Examination-Activity Limitations Lift;Squat;Bend;Stairs;Stand;Transfers;Insurance claims handler;Bathing;Hygiene/Grooming;Dressing;Toileting    Examination-Participation Restrictions Yard Work;Church;Cleaning;Driving    Stability/Clinical Decision Making Stable/Uncomplicated    Rehab Potential Good    Clinical Impairments Affecting Rehab Potential PMH    PT Frequency 2x / week    PT Duration 12 weeks    PT Treatment/Interventions ADLs/Self Care Home Management;Electrical Stimulation;Moist Heat;Traction;Ultrasound;Gait training;Stair training;Functional mobility training;Therapeutic activities;Therapeutic exercise;Balance  training;Neuromuscular re-education;Patient/family education;Manual techniques;Passive range of motion;Dry needling;Joint Manipulations;Cryotherapy    PT Next Visit Plan balance, strength; progress standing balance, dynamic balance, continue POC as previously indicated    Consulted and Agree with Plan of Care Patient             Patient will benefit from skilled therapeutic intervention in order to improve the following deficits and impairments:  Abnormal gait, Decreased balance, Decreased mobility, Decreased endurance, Difficulty walking, Hypomobility, Impaired sensation, Decreased range of motion, Impaired perceived functional ability, Decreased activity tolerance, Decreased coordination, Decreased strength, Impaired flexibility, Postural dysfunction, Pain  Visit Diagnosis: Abnormality of gait and mobility  Difficulty in walking, not elsewhere classified  Muscle weakness (generalized)  Other lack of coordination     Problem List Patient Active Problem List   Diagnosis Date Noted   History of decompression of median nerve 01/01/2021   Paroxysmal atrial fibrillation ([place]) 10/06/2020   Carotid stenosis, right 09/17/2020   S/P cervical spinal fusion    Leukocytosis    Essential hypertension    Anemia of chronic disease    Postoperative pain    Neuropathic pain    Cervical myelopathy ([place]) 08/07/2020   Preop cardiovascular exam 07/17/2020   SOB (shortness of breath) on exertion 07/17/2020   Leg weakness, bilateral 07/13/2020   Aortic atherosclerosis ([place]) 07/09/2020   Body mass index (BMI) 34.0-34.9, adult 12/25/2019   Myalgia 10/30/2019  Lumbar post-laminectomy syndrome 10/24/2019   PAD (peripheral artery disease) ([place]) 06/06/2019   CKD (chronic kidney disease) stage 3, GFR 30-59 ml/min (HCC) 04/29/2019   B12 deficiency 01/23/2019   Left arm numbness 02/28/2018   Neck pain 02/28/2018   Anemia 02/02/2017   DDD (degenerative disc disease), cervical 02/02/2017    Hypothyroid 02/02/2017   MRSA (methicillin resistant staph aureus) culture positive 02/02/2017   Nocturnal hypoxia 02/02/2017   Senile purpura ([place]) 10/03/2016   Essential hypertension, benign 09/16/2016   Bilateral carotid artery disease ([place]) 09/16/2016   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/18/2016   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   History of prostate cancer 12/21/2015   Left carpal tunnel syndrome 11/11/2015   Kidney stone on left side 07/05/2015   Myasthenia gravis ([place]) 05/26/2015   Radiculitis 01/05/2015   Long-term use of high-risk medication 10/15/2014   Persistent cough 09/10/2014   Pure hypercholesterolemia 07/25/2014   Spinal stenosis, lumbar region, with neurogenic claudication 02/24/2014   Lumbosacral stenosis with neurogenic claudication ([place]) 02/24/2014   COPD (chronic obstructive pulmonary disease) ([place]) 01/22/2014    Lewis Moccasin, PT 03/02/2021, 11:41 AM  [place] MAIN [facility] SERVICES [address] [place], [place], [address] Phone: [phone]   Fax:  [phone]  Name: CASIN FEDERICI MRN: [id number] Date of Birth: 1946-03-04

## 2021-03-04 ENCOUNTER — Encounter: Payer: Medicare Other | Admitting: Occupational Therapy

## 2021-03-04 ENCOUNTER — Ambulatory Visit: Payer: Medicare Other

## 2021-03-04 ENCOUNTER — Other Ambulatory Visit: Payer: Self-pay

## 2021-03-04 DIAGNOSIS — R269 Unspecified abnormalities of gait and mobility: Secondary | ICD-10-CM | POA: Diagnosis not present

## 2021-03-04 DIAGNOSIS — R278 Other lack of coordination: Secondary | ICD-10-CM

## 2021-03-04 DIAGNOSIS — M6281 Muscle weakness (generalized): Secondary | ICD-10-CM

## 2021-03-04 DIAGNOSIS — R262 Difficulty in walking, not elsewhere classified: Secondary | ICD-10-CM

## 2021-03-04 NOTE — Therapy (Signed)
[place] MAIN [facility] SERVICES [address] [place], [place], [address] Phone: [phone]   Fax:  [phone]  Physical Therapy Treatment  Patient Details  Name: Francisco [name] MRN: [id number] Date of Birth: 1946-02-18 Referring Provider (PT): Lauraine Rinne, PA-C   Encounter Date: 03/04/2021   PT End of Session - 03/04/21 0859     Visit Number 38    Number of Visits 50    Date for PT Re-Evaluation 03/23/21    Authorization Type [employer] Medicare;  3/10; PN 01/28/2021    Authorization Time Period 08/25/20-11/17/20; 10/06/20-12/29/20; 12/29/2020- 03/23/2021    PT Start Time 8786    PT Stop Time 7672    PT Time Calculation (min) 44 min    Equipment Utilized During Treatment Gait belt    Activity Tolerance Patient tolerated treatment well;No increased pain;Patient limited by fatigue    Behavior During Therapy [facility] for tasks assessed/performed             Past Medical History:  Diagnosis Date   Arthritis    lower left hip   Atypical angina (HCC)    Bilateral hand numbness    from back surgery   Bronchitis, chronic (HCC)    Cancer ([place])    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; Merkel cell cancer, and Basal cell cancer (twice; 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   Carotid stenosis    CKD (chronic kidney disease) stage 3, GFR 30-59 ml/min (HCC)    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   DDD (degenerative disc disease), cervical    Hypercholesterolemia    Hypertension    Hypothyroidism    pt takes Levothyroxine daily   Lumbosacral spinal stenosis    Myasthenia gravis, adult form ([place])    PAD (peripheral artery disease) (HCC)    Shortness of breath    Lung MD- Dr Darlin Coco   Sleep apnea    do not use CPAP every night    Past Surgical History:  Procedure Laterality Date   ANTERIOR CERVICAL DECOMP/DISCECTOMY FUSION  07/18/2011   Procedure: ANTERIOR CERVICAL DECOMPRESSION/DISCECTOMY FUSION 2 LEVELS;  Surgeon: Cooper Render Pool;  Location:  [place] [facility];  Service: Neurosurgery;  Laterality: N/A;  cervical five-six, cervical six-seven anterior cervical discectomy and fusion   BACK SURGERY     in [place]     01/2020 Right, 04/2020 Left   CARDIAC CATHETERIZATION     2005 at [facility], no stents   CAROTID PTA/STENT INTERVENTION N/A 09/17/2020   Procedure: CAROTID PTA/STENT INTERVENTION;  Surgeon: Algernon Huxley, MD;  Location: [place] CV LAB;  Service: Cardiovascular;  Laterality: N/A;   CATARACT EXTRACTION W/PHACO Left 01/06/2020   Procedure: CATARACT EXTRACTION PHACO AND INTRAOCULAR LENS PLACEMENT (IOC) ISTENT INJ LEFT 3.81  00:33.3;  Surgeon: Eulogio Bear, MD;  Location: [place];  Service: Ophthalmology;  Laterality: Left;   CATARACT EXTRACTION W/PHACO Right 02/03/2020   Procedure: CATARACT EXTRACTION PHACO AND INTRAOCULAR LENS PLACEMENT ([place]) RIGHT ISTENT INJ;  Surgeon: Eulogio Bear, MD;  Location: [place];  Service: Ophthalmology;  Laterality: Right;  4.29 0:35.6   COLONOSCOPY     HERNIA REPAIR Left    inguinal hernia repair in 1985   LUMBAR LAMINECTOMY/DECOMPRESSION MICRODISCECTOMY Left 02/24/2014   Procedure: LUMBAR LAMINECTOMY/DECOMPRESSION MICRODISCECTOMY LUMBAR THREE-FOUR, FOUR-FIVE, LEFT FIVE-SACRAL ONE ;  Surgeon: Charlie Pitter, MD;  Location: [place] [facility];  Service: Neurosurgery;  Laterality: Left;  LUMBAR  LAMINECTOMY/DECOMPRESSION MICRODISCECTOMY LUMBAR THREE-FOUR, FOUR-FIVE, LEFT FIVE-SACRAL ONE    POSTERIOR CERVICAL FUSION/FORAMINOTOMY N/A 08/07/2020   Procedure: C3-6 POSTERIOR FUSION WITH DECOMPRESSION;  Surgeon: Meade Maw, MD;  Location: [facility] ORS;  Service: Neurosurgery;  Laterality: N/A;   PROSTATECTOMY  8/14   [facility] Dr Mare Ferrari     There were no vitals filed for this visit.   Subjective Assessment - 03/04/21 0858     Pertinent History Francisco [name] is a 75yoM referred to OPPT neuro s/p myelopathy s/p cervical decompression  and fusion. Pt sustained several months of general decline in mobility prior to surgical decompression. Pt went to inpatient rehab after hospitalization. [facility].  Pt underwent C3-6 decompression, fusion on 11/26. PMH: myasthenia gravis, 2012 ACDF 5/6, 6/7; OSA, hypothyroidism, HTN, COPD, multiple back surgeries, 2021 carpal tunnel release, referred to OPOT, but only seen for evaluation. 8/10 OPPT evaluation for LSS c radiating pain into the left hip, only completed evaluation session. September - November progressive decline in leg strength and tolerance to upright activity. Also noted a decline in tool manipulation for ADL (pen and meal utensil). Seen by OT and PT as an outpatient for other potential etiology (Carpal tunnel syndrome, and lumbar spinal stenosis), but eventually identified to be related to cervical myelopathy. Several months prior has no limitations in mobility or function. Pt scheduled for Jan 6th: Carotid endarterectomy c Dr. Leotis Pain. Pt has a long history of lumbar DJD, s/p L5/S1 decompression discectomy x2, L5 nerve root injections. Most recent Lumbar MRI (11/10/18) revealin gof advanced formaminal stenosis of L3/4, L4/5, L5/S1. Pt is has had LLE neurological weakness and pain since 2015 and has been followed by neurosurgery.    Limitations Walking;Lifting;Standing;House hold activities    How long can you sit comfortably? Not limited    How long can you stand comfortably? 3-5 minutes    How long can you walk comfortably? 10 minutes    Patient Stated Goals return to functional baseline status of Summer 2021.    Currently in Pain? No/denies             Interventions:   Nustep L2 LE only x 5 min  Supine therex:   Single Leg raises - 3lb. X 10 reps (left was hard and right LE was easy per patient)   Heel slides - 3lb. X 10 reps BLE   Bridging: x 10 reps with 2 sec hold- VC for correct technique.   Supine LE SLR+ hip Abd up/over cone X 10 reps BLE    Sidelye Hip ER (Clamshell) with GTB x 10 reps  Sidelye Hip abd without weight - 10 reps- BLE - Patient reports as very hard.    Standing hip flex with ER x 10 reps BLE.   Standing donkey Kick x 10 reps BLE  Sit to stand - holding on weighted yellow ball with arms outstretched- 10 reps. Education provided throughout session via VC/TC and demonstration to facilitate movement at target joints and correct muscle activation for all testing and exercises performed.   Clinical Impression: Patient reports very fatigued at end of session. He presents with fatigue yet able to perform all therex today with VC/TC and visual demonstration and able to return demonstration well. Instructed him to incorporate the supine and sidelye therex into his home program and he verbalized understanding. Pt will continue to benefit from further skilled therapy to improve balance and LE strength to improve ease with ADLs and quality of life.  PT Short Term Goals - 12/08/20 1357       PT SHORT TERM GOAL #1   Title After 6 weeks patient will be independent in HEP to progress strength, AMB, and balance.    Baseline 2/9: HEP compliant 3/29: HEP compliant    Time 6    Period Weeks    Status Achieved    Target Date 11/17/20      PT SHORT TERM GOAL #2   Title After 6 weeks pt will demonstrate improved 5xSTS hands free chair + airex foam in <20sec    Baseline 01/25: 31s hands free without airex foam 2/9: unable to do on airex 3/29 12.03 seconds no UE support required    Time 6    Period Weeks    Status Achieved    Target Date --               PT Long Term Goals - 01/28/21 1551       PT LONG TERM GOAL #1   Title Patient will reduce timed up and go to <11 seconds to reduce fall risk and demonstrate improved transfer/gait ability.    Baseline 01/25: 30.26s 2/9: 19.55 seconds with RW 3/29: 12.36 seconds with RW.   12/29/2020= 12:15 sec with RW; 01/28/2021= 16  sec with use of SPC    Time 12    Period Weeks    Status On-going    Target Date 03/23/21      PT LONG TERM GOAL #2   Title Patient will demonstrate 6MWT (LRAD) >746ft to demonstrate improved capacity to access the community.    Baseline 12/16: 120 ft, 01/25: 165 ft with 1 seated rest break for 2 mins 2/9: 395 ft with RW and one seated rest break 3/29: 520 ft with RW and one seated rest break. 12/29/2020= 550 feet using RW. 01/28/2021= 185 feet with use of SPC at 1 min 38 sec    Time 12    Period Weeks    Status On-going    Target Date 03/23/21      PT LONG TERM GOAL #3   Title Patient will increase 10 meter walk test to >1.50m/s as to improve gait speed for better community ambulation and to reduce fall risk.    Baseline 01/25: 0.94m/s 2/9: 0.81 m/s with RW 3/29: 0.91 m/s with RW. 12/29/2020=0.91 m/s with RW. 01/28/2021= 0.75 m/s with use of SPC    Time 12    Period Weeks    Status On-going    Target Date 03/23/21      PT LONG TERM GOAL #4   Title After 12 weeks pt to demonstrate improved FOTO score >70    Baseline 01/25: 51 2/9: 55.97 % 3/29: 62.9% 12/29/2020= 56%    Time 12    Period Weeks    Status On-going      PT LONG TERM GOAL #5   Title Patient (> 16 years old) will complete five times sit to stand test in <12.5 seconds indicating an increased LE strength and improved balance.    Baseline 01/25: 31s;  2/9: hands on knees 13.09; 13.16sec hands-free 3/29: 12.03 seconds no UE support.    Time 12    Period Weeks    Status Achieved                   Plan - 03/04/21 0902     Clinical Impression Statement Patient reports very fatigued at end of session. He presents with fatigue yet able  to perform all therex today with VC/TC and visual demonstration and able to return demonstration well. Instructed him to incorporate the supine and sidelye therex into his home program and he verbalized understanding. Pt will continue to benefit from further skilled therapy to improve  balance and LE strength to improve ease with ADLs and quality of life.    Personal Factors and Comorbidities Age;Comorbidity 2;Past/Current Experience;Time since onset of injury/illness/exacerbation    Comorbidities COPD, History of Cancer, myasthenia gravis, chronic lumbar surgical history.    Examination-Activity Limitations Lift;Squat;Bend;Stairs;Stand;Transfers;Insurance claims handler;Bathing;Hygiene/Grooming;Dressing;Toileting    Examination-Participation Restrictions Yard Work;Church;Cleaning;Driving    Stability/Clinical Decision Making Stable/Uncomplicated    Rehab Potential Good    Clinical Impairments Affecting Rehab Potential PMH    PT Frequency 2x / week    PT Duration 12 weeks    PT Treatment/Interventions ADLs/Self Care Home Management;Electrical Stimulation;Moist Heat;Traction;Ultrasound;Gait training;Stair training;Functional mobility training;Therapeutic activities;Therapeutic exercise;Balance training;Neuromuscular re-education;Patient/family education;Manual techniques;Passive range of motion;Dry needling;Joint Manipulations;Cryotherapy    PT Next Visit Plan balance, strength; progress standing balance, dynamic balance, continue POC as previously indicated    PT Home Exercise Plan Added supine SLR, Bridging, Sidelye clamshell/hip abd    Consulted and Agree with Plan of Care Patient             Patient will benefit from skilled therapeutic intervention in order to improve the following deficits and impairments:  Abnormal gait, Decreased balance, Decreased mobility, Decreased endurance, Difficulty walking, Hypomobility, Impaired sensation, Decreased range of motion, Impaired perceived functional ability, Decreased activity tolerance, Decreased coordination, Decreased strength, Impaired flexibility, Postural dysfunction, Pain  Visit Diagnosis: Abnormality of gait and mobility  Difficulty in walking, not elsewhere classified  Muscle weakness  (generalized)  Other lack of coordination     Problem List Patient Active Problem List   Diagnosis Date Noted   History of decompression of median nerve 01/01/2021   Paroxysmal atrial fibrillation ([place]) 10/06/2020   Carotid stenosis, right 09/17/2020   S/P cervical spinal fusion    Leukocytosis    Essential hypertension    Anemia of chronic disease    Postoperative pain    Neuropathic pain    Cervical myelopathy ([place]) 08/07/2020   Preop cardiovascular exam 07/17/2020   SOB (shortness of breath) on exertion 07/17/2020   Leg weakness, bilateral 07/13/2020   Aortic atherosclerosis ([place]) 07/09/2020   Body mass index (BMI) 34.0-34.9, adult 12/25/2019   Myalgia 10/30/2019   Lumbar post-laminectomy syndrome 10/24/2019   PAD (peripheral artery disease) (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   History of prostate cancer 12/21/2015   Left carpal tunnel syndrome 11/11/2015   Kidney stone on left side 07/05/2015   Myasthenia gravis ([place]) 05/26/2015   Radiculitis 01/05/2015   Long-term use of high-risk medication 10/15/2014   Persistent cough 09/10/2014   Pure hypercholesterolemia 07/25/2014   Spinal stenosis, lumbar region, with neurogenic claudication 02/24/2014   Lumbosacral stenosis with neurogenic claudication ([place]) 02/24/2014   COPD (chronic obstructive pulmonary disease) ([place]) 01/22/2014  Lewis Moccasin,  PT 03/04/2021, 9:57 AM  [place] MAIN [facility] SERVICES [address] [place], [place], [address] Phone: [phone]   Fax:  [phone]  Name: ARIANA CAVENAUGH MRN: [id number] Date of Birth: 05-Mar-1946

## 2021-03-09 ENCOUNTER — Encounter: Payer: Medicare Other | Admitting: Occupational Therapy

## 2021-03-09 ENCOUNTER — Other Ambulatory Visit: Payer: Self-pay

## 2021-03-09 ENCOUNTER — Ambulatory Visit: Payer: Medicare Other

## 2021-03-09 DIAGNOSIS — M6281 Muscle weakness (generalized): Secondary | ICD-10-CM

## 2021-03-09 DIAGNOSIS — R269 Unspecified abnormalities of gait and mobility: Secondary | ICD-10-CM

## 2021-03-09 DIAGNOSIS — R262 Difficulty in walking, not elsewhere classified: Secondary | ICD-10-CM

## 2021-03-09 NOTE — Therapy (Signed)
[place] MAIN [facility] SERVICES [address] [place], [place], [address] Phone: [phone]   Fax:  [phone]  Physical Therapy Treatment  Patient Details  Name: Francisco [name] MRN: [id number] Date of Birth: 1946-04-28 Referring Provider (PT): Lauraine Rinne, PA-C   Encounter Date: 03/09/2021   PT End of Session - 03/09/21 1507     Visit Number 39    Number of Visits 50    Date for PT Re-Evaluation 03/23/21    Authorization Type [employer] Medicare;  3/10; PN 01/28/2021    Authorization Time Period 08/25/20-11/17/20; 10/06/20-12/29/20; 12/29/2020- 03/23/2021    PT Start Time 1455    PT Stop Time 1539    PT Time Calculation (min) 44 min    Equipment Utilized During Treatment Gait belt    Activity Tolerance Patient tolerated treatment well;No increased pain;Patient limited by fatigue    Behavior During Therapy [facility] for tasks assessed/performed             Past Medical History:  Diagnosis Date   Arthritis    lower left hip   Atypical angina (HCC)    Bilateral hand numbness    from back surgery   Bronchitis, chronic (HCC)    Cancer ([place])    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; Merkel cell cancer, and Basal cell cancer (twice; 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   Carotid stenosis    CKD (chronic kidney disease) stage 3, GFR 30-59 ml/min (HCC)    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   DDD (degenerative disc disease), cervical    Hypercholesterolemia    Hypertension    Hypothyroidism    pt takes Levothyroxine daily   Lumbosacral spinal stenosis    Myasthenia gravis, adult form ([place])    PAD (peripheral artery disease) (HCC)    Shortness of breath    Lung MD- Dr Darlin Coco   Sleep apnea    do not use CPAP every night    Past Surgical History:  Procedure Laterality Date   ANTERIOR CERVICAL DECOMP/DISCECTOMY FUSION  07/18/2011   Procedure: ANTERIOR CERVICAL DECOMPRESSION/DISCECTOMY FUSION 2 LEVELS;  Surgeon: Cooper Render Pool;  Location:  [place] [facility];  Service: Neurosurgery;  Laterality: N/A;  cervical five-six, cervical six-seven anterior cervical discectomy and fusion   BACK SURGERY     in [place]     01/2020 Right, 04/2020 Left   CARDIAC CATHETERIZATION     2005 at [facility], no stents   CAROTID PTA/STENT INTERVENTION N/A 09/17/2020   Procedure: CAROTID PTA/STENT INTERVENTION;  Surgeon: Algernon Huxley, MD;  Location: [place] CV LAB;  Service: Cardiovascular;  Laterality: N/A;   CATARACT EXTRACTION W/PHACO Left 01/06/2020   Procedure: CATARACT EXTRACTION PHACO AND INTRAOCULAR LENS PLACEMENT (IOC) ISTENT INJ LEFT 3.81  00:33.3;  Surgeon: Eulogio Bear, MD;  Location: [place];  Service: Ophthalmology;  Laterality: Left;   CATARACT EXTRACTION W/PHACO Right 02/03/2020   Procedure: CATARACT EXTRACTION PHACO AND INTRAOCULAR LENS PLACEMENT ([place]) RIGHT ISTENT INJ;  Surgeon: Eulogio Bear, MD;  Location: [place];  Service: Ophthalmology;  Laterality: Right;  4.29 0:35.6   COLONOSCOPY     HERNIA REPAIR Left    inguinal hernia repair in 1985   LUMBAR LAMINECTOMY/DECOMPRESSION MICRODISCECTOMY Left 02/24/2014   Procedure: LUMBAR LAMINECTOMY/DECOMPRESSION MICRODISCECTOMY LUMBAR THREE-FOUR, FOUR-FIVE, LEFT FIVE-SACRAL ONE ;  Surgeon: Charlie Pitter, MD;  Location: [place] [facility];  Service: Neurosurgery;  Laterality: Left;  LUMBAR  LAMINECTOMY/DECOMPRESSION MICRODISCECTOMY LUMBAR THREE-FOUR, FOUR-FIVE, LEFT FIVE-SACRAL ONE    POSTERIOR CERVICAL FUSION/FORAMINOTOMY N/A 08/07/2020   Procedure: C3-6 POSTERIOR FUSION WITH DECOMPRESSION;  Surgeon: Meade Maw, MD;  Location: [facility] ORS;  Service: Neurosurgery;  Laterality: N/A;   PROSTATECTOMY  8/14   [facility] Dr Mare Ferrari     There were no vitals filed for this visit.   Subjective Assessment - 03/09/21 1501     Subjective Patient reports feeling good today- Rates himself at a 5-6/10 (stating 10/10 normal).    Pertinent  History Francisco [name] is a 12yoM referred to OPPT neuro s/p myelopathy s/p cervical decompression and fusion. Pt sustained several months of general decline in mobility prior to surgical decompression. Pt went to inpatient rehab after hospitalization. [facility].  Pt underwent C3-6 decompression, fusion on 11/26. PMH: myasthenia gravis, 2012 ACDF 5/6, 6/7; OSA, hypothyroidism, HTN, COPD, multiple back surgeries, 2021 carpal tunnel release, referred to OPOT, but only seen for evaluation. 8/10 OPPT evaluation for LSS c radiating pain into the left hip, only completed evaluation session. September - November progressive decline in leg strength and tolerance to upright activity. Also noted a decline in tool manipulation for ADL (pen and meal utensil). Seen by OT and PT as an outpatient for other potential etiology (Carpal tunnel syndrome, and lumbar spinal stenosis), but eventually identified to be related to cervical myelopathy. Several months prior has no limitations in mobility or function. Pt scheduled for Jan 6th: Carotid endarterectomy c Dr. Leotis Pain. Pt has a long history of lumbar DJD, s/p L5/S1 decompression discectomy x2, L5 nerve root injections. Most recent Lumbar MRI (11/10/18) revealin gof advanced formaminal stenosis of L3/4, L4/5, L5/S1. Pt is has had LLE neurological weakness and pain since 2015 and has been followed by neurosurgery.    Limitations Walking;Lifting;Standing;House hold activities    How long can you sit comfortably? Not limited    How long can you stand comfortably? 3-5 minutes    How long can you walk comfortably? 10 minutes    Patient Stated Goals return to functional baseline status of Summer 2021.    Currently in Pain? No/denies             Interventions:   Nustep: L2 x 5 min LE only for warmup.  Balance activities in // bar:   Static stand - 1 LE on 6" step and other leg positioned behind on a blue airex pad- slightly wider than tandem-  Basketball toss x 16 shots x 2 sets.   Static stand - 1 LE on 6" step and other leg positioned behind on a blue airex pad- slightly wider than tandem- Basketball toss x 16 shots x 2 sets.     Standing in bars with feet positioned on 1/2 white foam roll- curve side up and performing basketball shots x 16 balls x 2 sets  Standing in //bars with feet positioned close to tandem on blue balance beam x 16 shots x 2 sets.  *Patient experienced unsteadiness during all tasks requiring intermittent UE support to maintain balance.   Standing Hip flex in bars with GTB (march as high as the bar) x 10 reps BLE. Patient performed well with right LE yet struggled with raising Left LE up to bar secondary to increased weakness.   Standing hip abd with 2 GTB (one positioned at distal thigh and one at ankle height) x 10 reps BLE. Increased difficulty performing on left LE.  Education provided throughout session via VC/TC and demonstration  to facilitate movement at target joints and correct muscle activation for all testing and exercises performed.   Clinical Impression: Patient challenged with progressive balance activities today- able to participate well overall and use UE support on bar as needed. He was fatigued at end of session and was observed using mostly ankle strategy with more hip strategy with tandem standing today. Pt will continue to benefit from further skilled therapy to improve balance and LE strength to improve ease with ADLs and QOL                       PT Education - 03/09/21 1506     Education provided Yes    Education Details exercise balance    Person(s) Educated Patient    Methods Explanation;Demonstration;Tactile cues;Verbal cues    Comprehension Verbalized understanding;Returned demonstration;Verbal cues required;Tactile cues required;Need further instruction              PT Short Term Goals - 12/08/20 1357       PT SHORT TERM GOAL #1   Title After 6 weeks  patient will be independent in HEP to progress strength, AMB, and balance.    Baseline 2/9: HEP compliant 3/29: HEP compliant    Time 6    Period Weeks    Status Achieved    Target Date 11/17/20      PT SHORT TERM GOAL #2   Title After 6 weeks pt will demonstrate improved 5xSTS hands free chair + airex foam in <20sec    Baseline 01/25: 31s hands free without airex foam 2/9: unable to do on airex 3/29 12.03 seconds no UE support required    Time 6    Period Weeks    Status Achieved    Target Date --               PT Long Term Goals - 01/28/21 1551       PT LONG TERM GOAL #1   Title Patient will reduce timed up and go to <11 seconds to reduce fall risk and demonstrate improved transfer/gait ability.    Baseline 01/25: 30.26s 2/9: 19.55 seconds with RW 3/29: 12.36 seconds with RW.   12/29/2020= 12:15 sec with RW; 01/28/2021= 16 sec with use of SPC    Time 12    Period Weeks    Status On-going    Target Date 03/23/21      PT LONG TERM GOAL #2   Title Patient will demonstrate 6MWT (LRAD) >755ft to demonstrate improved capacity to access the community.    Baseline 12/16: 120 ft, 01/25: 165 ft with 1 seated rest break for 2 mins 2/9: 395 ft with RW and one seated rest break 3/29: 520 ft with RW and one seated rest break. 12/29/2020= 550 feet using RW. 01/28/2021= 185 feet with use of SPC at 1 min 38 sec    Time 12    Period Weeks    Status On-going    Target Date 03/23/21      PT LONG TERM GOAL #3   Title Patient will increase 10 meter walk test to >1.38m/s as to improve gait speed for better community ambulation and to reduce fall risk.    Baseline 01/25: 0.72m/s 2/9: 0.81 m/s with RW 3/29: 0.91 m/s with RW. 12/29/2020=0.91 m/s with RW. 01/28/2021= 0.75 m/s with use of SPC    Time 12    Period Weeks    Status On-going    Target Date 03/23/21  PT LONG TERM GOAL #4   Title After 12 weeks pt to demonstrate improved FOTO score >70    Baseline 01/25: 51 2/9: 55.97 % 3/29: 62.9%  12/29/2020= 56%    Time 12    Period Weeks    Status On-going      PT LONG TERM GOAL #5   Title Patient (> 24 years old) will complete five times sit to stand test in <12.5 seconds indicating an increased LE strength and improved balance.    Baseline 01/25: 31s;  2/9: hands on knees 13.09; 13.16sec hands-free 3/29: 12.03 seconds no UE support.    Time 12    Period Weeks    Status Achieved                   Plan - 03/09/21 1509     Clinical Impression Statement Patient challenged with progressive balance activities today- able to participate well overall and use UE support on bar as needed. He was fatigued at end of session and was observed using mostly ankle strategy with more hip strategy with tandem standing today. Pt will continue to benefit from further skilled therapy to improve balance and LE strength to improve ease with ADLs and QOL    Personal Factors and Comorbidities Age;Comorbidity 2;Past/Current Experience;Time since onset of injury/illness/exacerbation    Comorbidities COPD, History of Cancer, myasthenia gravis, chronic lumbar surgical history.    Examination-Activity Limitations Lift;Squat;Bend;Stairs;Stand;Transfers;Insurance claims handler;Bathing;Hygiene/Grooming;Dressing;Toileting    Examination-Participation Restrictions Yard Work;Church;Cleaning;Driving    Stability/Clinical Decision Making Stable/Uncomplicated    Rehab Potential Good    Clinical Impairments Affecting Rehab Potential PMH    PT Frequency 2x / week    PT Duration 12 weeks    PT Treatment/Interventions ADLs/Self Care Home Management;Electrical Stimulation;Moist Heat;Traction;Ultrasound;Gait training;Stair training;Functional mobility training;Therapeutic activities;Therapeutic exercise;Balance training;Neuromuscular re-education;Patient/family education;Manual techniques;Passive range of motion;Dry needling;Joint Manipulations;Cryotherapy    PT Next Visit Plan balance, strength;  progress standing balance, dynamic balance, continue POC as previously indicated    PT Home Exercise Plan Added supine SLR, Bridging, [place] clamshell/hip abd    Consulted and Agree with Plan of Care Patient             Patient will benefit from skilled therapeutic intervention in order to improve the following deficits and impairments:  Abnormal gait, Decreased balance, Decreased mobility, Decreased endurance, Difficulty walking, Hypomobility, Impaired sensation, Decreased range of motion, Impaired perceived functional ability, Decreased activity tolerance, Decreased coordination, Decreased strength, Impaired flexibility, Postural dysfunction, Pain  Visit Diagnosis: Abnormality of gait and mobility  Difficulty in walking, not elsewhere classified  Muscle weakness (generalized)     Problem List Patient Active Problem List   Diagnosis Date Noted   History of decompression of median nerve 01/01/2021   Paroxysmal atrial fibrillation ([place]) 10/06/2020   Carotid stenosis, right 09/17/2020   S/P cervical spinal fusion    Leukocytosis    Essential hypertension    Anemia of chronic disease    Postoperative pain    Neuropathic pain    Cervical myelopathy ([place]) 08/07/2020   Preop cardiovascular exam 07/17/2020   SOB (shortness of breath) on exertion 07/17/2020   Leg weakness, bilateral 07/13/2020   Aortic atherosclerosis ([place]) 07/09/2020   Body mass index (BMI) 34.0-34.9, adult 12/25/2019   Myalgia 10/30/2019   Lumbar post-laminectomy syndrome 10/24/2019   PAD (peripheral artery disease) (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   History of prostate cancer 12/21/2015   Left carpal tunnel syndrome 11/11/2015   Kidney stone on left side 07/05/2015   Myasthenia gravis ([place]) 05/26/2015   Radiculitis 01/05/2015   Long-term use of high-risk medication 10/15/2014   Persistent cough 09/10/2014   Pure hypercholesterolemia 07/25/2014   Spinal stenosis, lumbar region, with neurogenic claudication 02/24/2014   Lumbosacral stenosis with neurogenic claudication ([place]) 02/24/2014   COPD (chronic obstructive pulmonary disease) ([place]) 01/22/2014    Lewis Moccasin, PT 03/09/2021, 5:35 PM  [place] MAIN [facility] SERVICES [address] [place], [place], [address] Phone: [phone]   Fax:  [phone]  Name: OAKLAND FANT MRN: [id number] Date of Birth: 23-Oct-1945

## 2021-03-10 ENCOUNTER — Ambulatory Visit
Admission: RE | Admit: 2021-03-10 | Discharge: 2021-03-10 | Disposition: A | Discharge status: Home / Self Care | Payer: Medicare Other | Source: Ambulatory Visit | Attending: Neurosurgery | Admitting: Neurosurgery

## 2021-03-10 DIAGNOSIS — M5416 Radiculopathy, lumbar region: Secondary | ICD-10-CM

## 2021-03-11 ENCOUNTER — Ambulatory Visit: Payer: Medicare Other

## 2021-03-11 ENCOUNTER — Other Ambulatory Visit: Payer: Self-pay

## 2021-03-11 ENCOUNTER — Encounter: Payer: Medicare Other | Admitting: Occupational Therapy

## 2021-03-11 DIAGNOSIS — R278 Other lack of coordination: Secondary | ICD-10-CM

## 2021-03-11 DIAGNOSIS — R262 Difficulty in walking, not elsewhere classified: Secondary | ICD-10-CM

## 2021-03-11 DIAGNOSIS — R269 Unspecified abnormalities of gait and mobility: Secondary | ICD-10-CM | POA: Diagnosis not present

## 2021-03-11 DIAGNOSIS — M6281 Muscle weakness (generalized): Secondary | ICD-10-CM

## 2021-03-11 NOTE — Therapy (Signed)
[place] MAIN [facility] SERVICES [address] [place], [place], [address] Phone: [phone]   Fax:  [phone]  Physical Therapy Treatment/Physical Therapy Progress Note   Dates of reporting period  01/28/2021   to 03/11/2021  Patient Details  Name: Francisco [name] MRN: [id number] Date of Birth: [birth year] Referring Provider (PT): Lauraine Rinne, PA-C   Encounter Date: 03/11/2021   PT End of Session - 03/11/21 1455     Visit Number 40    Number of Visits 50    Date for PT Re-Evaluation 03/23/21    Authorization Type UHC Medicare;  3/10; PN 01/28/2021    Authorization Time Period 08/25/20-11/17/20; 10/06/20-12/29/20; 12/29/2020- 03/23/2021    PT Start Time 1450    PT Stop Time 1535    PT Time Calculation (min) 45 min    Equipment Utilized During Treatment Gait belt    Activity Tolerance Patient tolerated treatment well;No increased pain;Patient limited by fatigue    Behavior During Therapy [facility] for tasks assessed/performed             Past Medical History:  Diagnosis Date   Arthritis    lower left hip   Atypical angina (HCC)    Bilateral hand numbness    from back surgery   Bronchitis, chronic (HCC)    Cancer ([place])    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; Merkel cell cancer, and Basal cell cancer (twice; 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   Carotid stenosis    CKD (chronic kidney disease) stage 3, GFR 30-59 ml/min (HCC)    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   DDD (degenerative disc disease), cervical    Hypercholesterolemia    Hypertension    Hypothyroidism    pt takes Levothyroxine daily   Lumbosacral spinal stenosis    Myasthenia gravis, adult form ([place])    PAD (peripheral artery disease) (HCC)    Shortness of breath    Lung MD- Dr Darlin Coco   Sleep apnea    do not use CPAP every night    Past Surgical History:  Procedure Laterality Date   ANTERIOR CERVICAL DECOMP/DISCECTOMY FUSION  07/18/2011   Procedure:  ANTERIOR CERVICAL DECOMPRESSION/DISCECTOMY FUSION 2 LEVELS;  Surgeon: Cooper Render Pool;  Location: [place] NEURO ORS;  Service: Neurosurgery;  Laterality: N/A;  cervical five-six, cervical six-seven anterior cervical discectomy and fusion   BACK SURGERY     in [place]     01/2020 Right, 04/2020 Left   CARDIAC CATHETERIZATION     2005 at [facility], no stents   CAROTID PTA/STENT INTERVENTION N/A 09/17/2020   Procedure: CAROTID PTA/STENT INTERVENTION;  Surgeon: Algernon Huxley, MD;  Location: [place] CV LAB;  Service: Cardiovascular;  Laterality: N/A;   CATARACT EXTRACTION W/PHACO Left 01/06/2020   Procedure: CATARACT EXTRACTION PHACO AND INTRAOCULAR LENS PLACEMENT (IOC) ISTENT INJ LEFT 3.81  00:33.3;  Surgeon: Eulogio Bear, MD;  Location: [place];  Service: Ophthalmology;  Laterality: Left;   CATARACT EXTRACTION W/PHACO Right 02/03/2020   Procedure: CATARACT EXTRACTION PHACO AND INTRAOCULAR LENS PLACEMENT ([place]) RIGHT ISTENT INJ;  Surgeon: Eulogio Bear, MD;  Location: [place];  Service: Ophthalmology;  Laterality: Right;  4.29 0:35.6   COLONOSCOPY     HERNIA REPAIR Left    inguinal hernia repair in [place] MICRODISCECTOMY Left 02/24/2014   Procedure: LUMBAR LAMINECTOMY/DECOMPRESSION MICRODISCECTOMY LUMBAR THREE-FOUR, FOUR-FIVE, LEFT FIVE-SACRAL ONE ;  Surgeon: Cooper Render  Pool, MD;  Location: [place] NEURO ORS;  Service: Neurosurgery;  Laterality: Left;  LUMBAR LAMINECTOMY/DECOMPRESSION MICRODISCECTOMY LUMBAR THREE-FOUR, FOUR-FIVE, LEFT FIVE-SACRAL ONE    POSTERIOR CERVICAL FUSION/FORAMINOTOMY N/A 08/07/2020   Procedure: C3-6 POSTERIOR FUSION WITH DECOMPRESSION;  Surgeon: Meade Maw, MD;  Location: [facility] ORS;  Service: Neurosurgery;  Laterality: N/A;   PROSTATECTOMY  8/14   [facility] Dr Mare Ferrari     There were no vitals filed for this visit.   Subjective Assessment - 03/11/21 1453     Subjective  Patient reports feeling very weak in his legs today.    Pertinent History Francisco [name] is a 75yoM referred to OPPT neuro s/p myelopathy s/p cervical decompression and fusion. Pt sustained several months of general decline in mobility prior to surgical decompression. Pt went to inpatient rehab after hospitalization. [facility].  Pt underwent C3-6 decompression, fusion on 11/26. PMH: myasthenia gravis, 2012 ACDF 5/6, 6/7; OSA, hypothyroidism, HTN, COPD, multiple back surgeries, 2021 carpal tunnel release, referred to OPOT, but only seen for evaluation. 8/10 OPPT evaluation for LSS c radiating pain into the left hip, only completed evaluation session. September - November progressive decline in leg strength and tolerance to upright activity. Also noted a decline in tool manipulation for ADL (pen and meal utensil). Seen by OT and PT as an outpatient for other potential etiology (Carpal tunnel syndrome, and lumbar spinal stenosis), but eventually identified to be related to cervical myelopathy. Several months prior has no limitations in mobility or function. Pt scheduled for Jan 6th: Carotid endarterectomy c Dr. Leotis Pain. Pt has a long history of lumbar DJD, s/p L5/S1 decompression discectomy x2, L5 nerve root injections. Most recent Lumbar MRI (11/10/18) revealin gof advanced formaminal stenosis of L3/4, L4/5, L5/S1. Pt is has had LLE neurological weakness and pain since 2015 and has been followed by neurosurgery.    Limitations Walking;Lifting;Standing;House hold activities    How long can you sit comfortably? Not limited    How long can you stand comfortably? 3-5 minutes    How long can you walk comfortably? 10 minutes    Patient Stated Goals return to functional baseline status of Summer 2021.    Currently in Pain? No/denies             Patient is due for progress note - however patient not feeling well today and almost cancelled  but decided to come to try to see if he could  do some form of exercise.  Goals will be addressed next visit.   Nustep L 2 LE only for cardiovascular and strengthening x 5 min   Seated LE strengthening exercises: Hip march with 3lb BLE x 11 reps Knee ext with 3lb BLE x 11 reps Hip flex/abd up/over 1/2 spike ball x 10 reps each LE Ham curl with GTB Standing hip Flex with GTB (distal quad and another band at ankles) x 10 reps Standing hip abd with GTB (distal quad and another band at ankles) x 10 reps BLE    Ambulation using quad cane- 250 feet throughout hallway and up elevator to outside of hospital- CGA-short reciprocal steps with increased unsteadiness with fatigue yet no loss of balance. Patient denied pain but reports legs felt very weak.   Clinical Impression:  Deferred outcome testing for today's progress note as patient reported upon arrival that he was feeling weak and almost cancelled. He required increased rest breaks and limited exercises today. He was weaker with decreased gait quality with walking today. Overall patient  was making progress and his condition has the potential to improve in response to therapy. Maximum improvement is yet to be obtained. The anticipated improvement is attainable and reasonable in a generally predictable time.                       PT Education - 03/11/21 1454     Education provided Yes    Education Details Exercise technique    Person(s) Educated Patient    Methods Explanation;Demonstration;Tactile cues;Verbal cues    Comprehension Verbalized understanding;Returned demonstration;Verbal cues required;Tactile cues required;Need further instruction              PT Short Term Goals - 12/08/20 1357       PT SHORT TERM GOAL #1   Title After 6 weeks patient will be independent in HEP to progress strength, AMB, and balance.    Baseline 2/9: HEP compliant 3/29: HEP compliant    Time 6    Period Weeks    Status Achieved    Target Date 11/17/20      PT SHORT TERM GOAL  #2   Title After 6 weeks pt will demonstrate improved 5xSTS hands free chair + airex foam in <20sec    Baseline 01/25: 31s hands free without airex foam 2/9: unable to do on airex 3/29 12.03 seconds no UE support required    Time 6    Period Weeks    Status Achieved    Target Date --               PT Long Term Goals - 01/28/21 1551       PT LONG TERM GOAL #1   Title Patient will reduce timed up and go to <11 seconds to reduce fall risk and demonstrate improved transfer/gait ability.    Baseline 01/25: 30.26s 2/9: 19.55 seconds with RW 3/29: 12.36 seconds with RW.   12/29/2020= 12:15 sec with RW; 01/28/2021= 16 sec with use of SPC    Time 12    Period Weeks    Status On-going    Target Date 03/23/21      PT LONG TERM GOAL #2   Title Patient will demonstrate 6MWT (LRAD) >767ft to demonstrate improved capacity to access the community.    Baseline 12/16: 120 ft, 01/25: 165 ft with 1 seated rest break for 2 mins 2/9: 395 ft with RW and one seated rest break 3/29: 520 ft with RW and one seated rest break. 12/29/2020= 550 feet using RW. 01/28/2021= 185 feet with use of SPC at 1 min 38 sec    Time 12    Period Weeks    Status On-going    Target Date 03/23/21      PT LONG TERM GOAL #3   Title Patient will increase 10 meter walk test to >1.67m/s as to improve gait speed for better community ambulation and to reduce fall risk.    Baseline 01/25: 0.89m/s 2/9: 0.81 m/s with RW 3/29: 0.91 m/s with RW. 12/29/2020=0.91 m/s with RW. 01/28/2021= 0.75 m/s with use of SPC    Time 12    Period Weeks    Status On-going    Target Date 03/23/21      PT LONG TERM GOAL #4   Title After 12 weeks pt to demonstrate improved FOTO score >70    Baseline 01/25: 51 2/9: 55.97 % 3/29: 62.9% 12/29/2020= 56%    Time 12    Period Weeks    Status On-going  PT LONG TERM GOAL #5   Title Patient (> 29 years old) will complete five times sit to stand test in <12.5 seconds indicating an increased LE strength and  improved balance.    Baseline 01/25: 31s;  2/9: hands on knees 13.09; 13.16sec hands-free 3/29: 12.03 seconds no UE support.    Time 12    Period Weeks    Status Achieved                   Plan - 03/11/21 1455     Clinical Impression Statement Deferred outcome testing for today's progress note as patient reported upon arrival that he was feeling weak and almost cancelled. He required increased rest breaks and limited exercises today. He was weaker with decreased gait quality with walking today. Overall patient was making progress and his condition has the potential to improve in response to therapy. Maximum improvement is yet to be obtained. The anticipated improvement is attainable and reasonable in a generally predictable time.    Personal Factors and Comorbidities Age;Comorbidity 2;Past/Current Experience;Time since onset of injury/illness/exacerbation    Comorbidities COPD, History of Cancer, myasthenia gravis, chronic lumbar surgical history.    Examination-Activity Limitations Lift;Squat;Bend;Stairs;Stand;Transfers;Insurance claims handler;Bathing;Hygiene/Grooming;Dressing;Toileting    Examination-Participation Restrictions Yard Work;Church;Cleaning;Driving    Stability/Clinical Decision Making Stable/Uncomplicated    Rehab Potential Good    Clinical Impairments Affecting Rehab Potential PMH    PT Frequency 2x / week    PT Duration 12 weeks    PT Treatment/Interventions ADLs/Self Care Home Management;Electrical Stimulation;Moist Heat;Traction;Ultrasound;Gait training;Stair training;Functional mobility training;Therapeutic activities;Therapeutic exercise;Balance training;Neuromuscular re-education;Patient/family education;Manual techniques;Passive range of motion;Dry needling;Joint Manipulations;Cryotherapy    PT Next Visit Plan balance, strength; progress standing balance, dynamic balance, continue POC as previously indicated    Consulted and Agree with Plan of Care  Patient             Patient will benefit from skilled therapeutic intervention in order to improve the following deficits and impairments:  Abnormal gait, Decreased balance, Decreased mobility, Decreased endurance, Difficulty walking, Hypomobility, Impaired sensation, Decreased range of motion, Impaired perceived functional ability, Decreased activity tolerance, Decreased coordination, Decreased strength, Impaired flexibility, Postural dysfunction, Pain  Visit Diagnosis: Abnormality of gait and mobility  Difficulty in walking, not elsewhere classified  Muscle weakness (generalized)  Other lack of coordination     Problem List Patient Active Problem List   Diagnosis Date Noted   History of decompression of median nerve 01/01/2021   Paroxysmal atrial fibrillation ([place]) 10/06/2020   Carotid stenosis, right 09/17/2020   S/P cervical spinal fusion    Leukocytosis    Essential hypertension    Anemia of chronic disease    Postoperative pain    Neuropathic pain    Cervical myelopathy ([place]) 08/07/2020   Preop cardiovascular exam 07/17/2020   SOB (shortness of breath) on exertion 07/17/2020   Leg weakness, bilateral 07/13/2020   Aortic atherosclerosis ([place]) 07/09/2020   Body mass index (BMI) 34.0-34.9, adult 12/25/2019   Myalgia 10/30/2019   Lumbar post-laminectomy syndrome 10/24/2019   PAD (peripheral artery disease) ([place]) 06/06/2019   CKD (chronic kidney disease) stage 3, GFR 30-59 ml/min (HCC) 04/29/2019   B12 deficiency 01/23/2019   Left arm numbness 02/28/2018   Neck pain 02/28/2018   Anemia 02/02/2017   DDD (degenerative disc disease), cervical 02/02/2017   Hypothyroid 02/02/2017   MRSA (methicillin resistant staph aureus) culture positive 02/02/2017   Nocturnal hypoxia 02/02/2017   Senile purpura ([place]) 10/03/2016   Essential hypertension, benign 09/16/2016   Bilateral  carotid artery disease ([place]) 09/16/2016   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/18/2016   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   History of prostate cancer 12/21/2015   Left carpal tunnel syndrome 11/11/2015   Kidney stone on left side 07/05/2015   Myasthenia gravis ([place]) 05/26/2015   Radiculitis 01/05/2015   Long-term use of high-risk medication 10/15/2014   Persistent cough 09/10/2014   Pure hypercholesterolemia 07/25/2014   Spinal stenosis, lumbar region, with neurogenic claudication 02/24/2014   Lumbosacral stenosis with neurogenic claudication ([place]) 02/24/2014   COPD (chronic obstructive pulmonary disease) ([place]) 01/22/2014    Lewis Moccasin, PT 03/11/2021, 4:05 PM  [place] MAIN [facility] SERVICES [address] [place], [place], [address] Phone: [phone]   Fax:  [phone]  Name: Francisco [name] MRN: [id number] Date of Birth: 04-22-1946

## 2021-03-17 ENCOUNTER — Ambulatory Visit: Payer: Medicare Other | Attending: Internal Medicine

## 2021-03-25 ENCOUNTER — Ambulatory Visit: Payer: Medicare Other

## 2021-03-30 ENCOUNTER — Ambulatory Visit: Payer: Medicare Other

## 2021-04-01 ENCOUNTER — Ambulatory Visit: Payer: Medicare Other

## 2021-04-06 ENCOUNTER — Ambulatory Visit: Payer: Medicare Other

## 2021-04-08 ENCOUNTER — Ambulatory Visit: Payer: Medicare Other

## 2021-04-13 ENCOUNTER — Ambulatory Visit: Payer: Medicare Other

## 2021-04-15 ENCOUNTER — Ambulatory Visit: Payer: Medicare Other

## 2021-04-20 ENCOUNTER — Ambulatory Visit: Payer: Medicare Other

## 2021-04-22 ENCOUNTER — Ambulatory Visit: Payer: Medicare Other

## 2021-04-26 ENCOUNTER — Other Ambulatory Visit: Payer: Self-pay | Admitting: Neurosurgery

## 2021-04-27 ENCOUNTER — Ambulatory Visit: Payer: Medicare Other

## 2021-04-29 ENCOUNTER — Ambulatory Visit: Payer: Medicare Other

## 2021-04-29 NOTE — Progress Notes (Signed)
Surgical Instructions    Your procedure is scheduled on 05/03/21.  Report to [facility] Main Entrance "A" at 11:15 A.M., then check in with the Admitting office.  Call this number if you have problems the morning of surgery:  [phone]   If you have any questions prior to your surgery date call [phone]: Open Monday-Friday 8am-4pm    Remember:  Do not eat after midnight the night before your surgery  You may drink clear liquids until 10:15 the morning of your surgery.   Clear liquids allowed are: Water, Non-Citrus Juices (without pulp), Carbonated Beverages, Clear Tea, Black Coffee Only, and Gatorade    Take these medicines the morning of surgery with A SIP OF WATER:  atorvastatin (LIPITOR) azaTHIOprine (IMURAN) gabapentin (NEURONTIN) levothyroxine (SYNTHROID, LEVOTHROID) pantoprazole (PROTONIX) predniSONE (DELTASONE) - if scheduled to take pyridostigmine (MESTINON) HYDROcodone-acetaminophen (NORCO) - if needed Ipratropium-Albuterol (COMBIVENT RESPIMAT) - if needed   As of today, STOP taking any Aspirin (unless otherwise instructed by your surgeon) Aleve, Naproxen, Ibuprofen, Motrin, Advil, Goody's, BC's, all herbal medications, fish oil, and all vitamins.  Follow your surgeon's instructions on when to stop Eliquis and Plavix.  If no instructions were given by your surgeon then you will need to call the office to get those instructions.             Do not wear jewelry Do not wear lotions, powders, colognes, or deodorant. Do not shave 48 hours prior to surgery.  Men may shave face and neck. Do not bring valuables to the hospital.              [facility] is not responsible for any belongings or valuables.  Do NOT Smoke (Tobacco/Vaping) or drink Alcohol 24 hours prior to your procedure If you use a CPAP at night, you may bring all equipment for your overnight stay.   Contacts, glasses, dentures or bridgework may not be worn into surgery, please bring cases for these  belongings   For patients admitted to the hospital, discharge time will be determined by your treatment team.   Patients discharged the day of surgery will not be allowed to drive home, and someone needs to stay with them for 24 hours.  ONLY 1 SUPPORT PERSON MAY BE PRESENT WHILE YOU ARE IN SURGERY. IF YOU ARE TO BE ADMITTED ONCE YOU ARE IN YOUR ROOM YOU WILL BE ALLOWED TWO (2) VISITORS.  Minor children may have two parents present. Special consideration for safety and communication needs will be reviewed on a case by case basis.  Special instructions:    Oral Hygiene is also important to reduce your risk of infection.  Remember - BRUSH YOUR TEETH THE MORNING OF SURGERY WITH YOUR REGULAR TOOTHPASTE   [place]- Preparing For Surgery  Before surgery, you can play an important role. Because skin is not sterile, your skin needs to be as free of germs as possible. You can reduce the number of germs on your skin by washing with CHG (chlorahexidine gluconate) Soap before surgery.  CHG is an antiseptic cleaner which kills germs and bonds with the skin to continue killing germs even after washing.     Please do not use if you have an allergy to CHG or antibacterial soaps. If your skin becomes reddened/irritated stop using the CHG.  Do not shave (including legs and underarms) for at least 48 hours prior to first CHG shower. It is OK to shave your face.  Please follow these instructions carefully.  Shower the NIGHT BEFORE SURGERY and the MORNING OF SURGERY with CHG Soap.   If you chose to wash your hair, wash your hair first as usual with your normal shampoo. After you shampoo, rinse your hair and body thoroughly to remove the shampoo.  Then [employer] and genitals (private parts) with your normal soap and rinse thoroughly to remove soap.  After that Use CHG Soap as you would any other liquid soap. You can apply CHG directly to the skin and wash gently with a scrungie or a clean washcloth.    Apply the CHG Soap to your body ONLY FROM THE NECK DOWN.  Do not use on open wounds or open sores. Avoid contact with your eyes, ears, mouth and genitals (private parts). Wash Face and genitals (private parts)  with your normal soap.   Wash thoroughly, paying special attention to the area where your surgery will be performed.  Thoroughly rinse your body with warm water from the neck down.  DO NOT shower/wash with your normal soap after using and rinsing off the CHG Soap.  Pat yourself dry with a CLEAN TOWEL.  Wear CLEAN PAJAMAS to bed the night before surgery  Place CLEAN SHEETS on your bed the night before your surgery  DO NOT SLEEP WITH PETS.   Day of Surgery:  Take a shower with CHG soap. Wear Clean/Comfortable clothing the morning of surgery Do not apply any deodorants/lotions.   Remember to brush your teeth WITH YOUR REGULAR TOOTHPASTE.   Please read over the following fact sheets that you were given.

## 2021-04-30 ENCOUNTER — Encounter (HOSPITAL_COMMUNITY)
Admission: RE | Admit: 2021-04-30 | Discharge: 2021-04-30 | Disposition: A | Discharge status: Home / Self Care | Payer: Medicare Other | Source: Ambulatory Visit | Attending: Neurosurgery | Admitting: Neurosurgery

## 2021-04-30 ENCOUNTER — Encounter (HOSPITAL_COMMUNITY): Payer: Self-pay

## 2021-04-30 ENCOUNTER — Other Ambulatory Visit: Payer: Self-pay

## 2021-04-30 DIAGNOSIS — Z01812 Encounter for preprocedural laboratory examination: Secondary | ICD-10-CM | POA: Insufficient documentation

## 2021-04-30 DIAGNOSIS — Z20822 Contact with and (suspected) exposure to covid-19: Secondary | ICD-10-CM | POA: Insufficient documentation

## 2021-04-30 HISTORY — DX: Cardiac arrhythmia, unspecified: I49.9

## 2021-04-30 HISTORY — DX: Chronic kidney disease, unspecified: N18.9

## 2021-04-30 HISTORY — DX: Gastro-esophageal reflux disease without esophagitis: K21.9

## 2021-04-30 LAB — CBC WITH DIFFERENTIAL/PLATELET
Abs Immature Granulocytes: 0.06 10*3/uL (ref 0.00–0.07)
Basophils Absolute: 0 10*3/uL (ref 0.0–0.1)
Basophils Relative: 0 %
Eosinophils Absolute: 0.1 10*3/uL (ref 0.0–0.5)
Eosinophils Relative: 1 %
HCT: 34.6 % — ABNORMAL LOW (ref 39.0–52.0)
Hemoglobin: 10.7 g/dL — ABNORMAL LOW (ref 13.0–17.0)
Immature Granulocytes: 1 %
Lymphocytes Relative: 17 %
Lymphs Abs: 1.2 10*3/uL (ref 0.7–4.0)
MCH: 34.3 pg — ABNORMAL HIGH (ref 26.0–34.0)
MCHC: 30.9 g/dL (ref 30.0–36.0)
MCV: 110.9 fL — ABNORMAL HIGH (ref 80.0–100.0)
Monocytes Absolute: 0.8 10*3/uL (ref 0.1–1.0)
Monocytes Relative: 11 %
Neutro Abs: 4.9 10*3/uL (ref 1.7–7.7)
Neutrophils Relative %: 70 %
Platelets: 234 10*3/uL (ref 150–400)
RBC: 3.12 MIL/uL — ABNORMAL LOW (ref 4.22–5.81)
RDW: 13.9 % (ref 11.5–15.5)
WBC: 7 10*3/uL (ref 4.0–10.5)
nRBC: 0 % (ref 0.0–0.2)

## 2021-04-30 LAB — BASIC METABOLIC PANEL
Anion gap: 8 (ref 5–15)
BUN: 27 mg/dL — ABNORMAL HIGH (ref 8–23)
CO2: 32 mmol/L (ref 22–32)
Calcium: 8.6 mg/dL — ABNORMAL LOW (ref 8.9–10.3)
Chloride: 100 mmol/L (ref 98–111)
Creatinine, Ser: 1.23 mg/dL (ref 0.61–1.24)
GFR, Estimated: >60 mL/min (ref >60)
Glucose, Bld: 104 mg/dL — ABNORMAL HIGH (ref 70–99)
Potassium: 4.2 mmol/L (ref 3.5–5.1)
Sodium: 140 mmol/L (ref 135–145)

## 2021-04-30 LAB — SARS CORONAVIRUS 2 (TAT 6-24 HRS): SARS Coronavirus 2: NEGATIVE

## 2021-04-30 LAB — SURGICAL PCR SCREEN
MRSA, PCR: NEGATIVE
Staphylococcus aureus: NEGATIVE

## 2021-04-30 NOTE — Anesthesia Preprocedure Evaluation (Addendum)
Anesthesia Evaluation  Patient identified by MRN, date of birth, ID band Patient awake    Reviewed: Allergy & Precautions, NPO status , Patient's Chart, lab work & pertinent test results  History of Anesthesia Complications Negative for: history of anesthetic complications  Airway Mallampati: II  TM Distance: >3 FB Neck ROM: Full    Dental  (+) Dental Advisory Given, Teeth Intact   Pulmonary sleep apnea and Oxygen sleep apnea , COPD,  COPD inhaler, former smoker,    Pulmonary exam normal        Cardiovascular hypertension, (-) angina+ Peripheral Vascular Disease  Normal cardiovascular exam+ dysrhythmias    Carotid US - 40-59% left ICAS, 1-39% right ICAS    Neuro/Psych  Myasthenia gravis - on mestinon, minimal symptoms    Neuromuscular disease negative psych ROS   GI/Hepatic Neg liver ROS, GERD  Medicated and Controlled,  Endo/Other  Hypothyroidism   Renal/GU CRFRenal disease     Musculoskeletal  (+) Arthritis ,   Abdominal   Peds  Hematology  (+) anemia ,   Anesthesia Other Findings Covid test negative   Reproductive/Obstetrics                            Anesthesia Physical Anesthesia Plan  ASA: 3  Anesthesia Plan: General   Post-op Pain Management:    Induction: Intravenous  PONV Risk Score and Plan: 2 and Treatment may vary due to age or medical condition, Ondansetron and Dexamethasone  Airway Management Planned: Oral ETT  Additional Equipment: None  Intra-op Plan:   Post-operative Plan: Extubation in OR  Informed Consent: I have reviewed the patients History and Physical, chart, labs and discussed the procedure including the risks, benefits and alternatives for the proposed anesthesia with the patient or authorized representative who has indicated his/her understanding and acceptance.     Dental advisory given  Plan Discussed with: CRNA and  Anesthesiologist  Anesthesia Plan Comments: (PAT note written 04/30/2021 by Myra Gianotti, PA-C. )       Anesthesia Quick Evaluation

## 2021-04-30 NOTE — Progress Notes (Signed)
Anesthesia Chart Review:  Case: Francisco [name] Date/Time: 05/03/21 1302   Procedure: Laminectomy and Foraminotomy - L2-L3 (Back) - 3C   Anesthesia type: General   Pre-op diagnosis: Stenosis   Location: [facility] OR ROOM 30 / [place] [facility]   Surgeons: Earnie Larsson, MD       DISCUSSION: Patient is a 75 year old male scheduled for the above procedure.  History includes former smoker (quit 09/12/01), COPD, OSA (inconsistent use of CPAP; nocturnal O2), dyspnea, Myasthenia gravis (diagnosed ~ 2013), hypercholesterolemia, dysrhythmia (post-op brady and brief PAF following 09/17/20 right carotid stent and intermittent PAF on Holter monitor 09/2020), atypical chest pain (low risk stress test 07/2020), CKD (stage III), carotid stenosis (s/p right carotid artery stent 09/17/20), GERD, hypothyroidism, glaucoma (s/p left trabecular bypass stent 01/06/20, right 02/03/20), prostate cancer (s/p radical prostatectomy 04/16/12), skin cancer (Merkel cell, BCC), spinal surgery (back surgery 1985; C5-7 ACDF 07/18/11; L3-S1 laminectomy; left L5-S1 microdiscectomy 02/24/14; C3-6 posterior fusion 08/07/20 for myelopathy/LLEL weakness and neurogenic bladder with post-op CIRC stay). BMI is consistent with obesity.   Cardiology clearance by Dr. Saralyn Pilar classified patient as "Low Risk" with permission to hold Plavix for 5 days prior to surgery and Eliquis 3 days prior to surgery.  - He reported last Eliquis 04/29/2021 and last Plavix 04/27/2021. He is not currently on ASA.  Pulmonology clearance letter addressed to Dr. Lanney Gins classified patient as "Moderate Risk" due to COPD stage 2.   Last visit with neurologist Dr. Manuella Ghazi 03/03/21. He is on azathioprine, Imuran, and prednisone (10 mg alternating days with 30 mg) for Myasthenia gravis. He is on DDAVP 0.2 mg tablets at bedtime, unclear if this is for neurogenic bladder.   04/30/2021 presurgical COVID-19 test was negative.  Anesthesia team to evaluate on the day of surgery.   VS: BP (!) 145/65   Pulse 66    Temp 36.5 C (Oral)   Resp 18   Ht '5\' 10"'$  (1.778 m)   Wt 108.7 kg   SpO2 98%   BMI 34.38 kg/m    PROVIDERS: Adin Hector, MD is PCP [facility], see [facility] Everywhere) - Isaias Cowman, MD is cardiologist. Last visit 04/14/21 with Clabe Seal, PA-C for follow-up and preoperative evaluation.  On Eliquis for PAF with CHA2DS2-VASc score of [facility], see [facility] Everywhere) - Leotis Pain, MD is vascular surgeon. Last visit 01/01/21.  Duplex showed right carotid stent widely patent with 40 to 59% left ICA stenosis.  Recheck in 6 months plan. Ottie Glazier, MD is pulmonologist. Last visit 03/31/21. [facility], see [facility] Care Everywhere) - Jennings Books, MD is neurologist. Last visit 03/03/21 for MG follow-up. 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 Continue MG medications. [facility], see [place]) -He also sees a urologist and dermatologist.   LABS: Labs reviewed: Acceptable for surgery. (all labs ordered are listed, but only abnormal results are displayed)  Labs Reviewed  BASIC METABOLIC PANEL - Abnormal; Notable for the following components:      Result Value   Glucose, Bld 104 (*)    BUN 27 (*)    Calcium 8.6 (*)    All other components within normal limits  CBC WITH DIFFERENTIAL/PLATELET - Abnormal; Notable for the following components:   RBC 3.12 (*)    Hemoglobin 10.7 (*)    HCT 34.6 (*)    MCV 110.9 (*)  MCH 34.3 (*)    All other components within normal limits  SURGICAL PCR SCREEN  SARS CORONAVIRUS 2 (TAT 6-24 HRS)    OTHER: PFTs 03/31/21: Result not viewable in [facility]. Tests outlined in 03/31/21 note by Dr. Lanney Gins,  PFTs "2016 Ratio 81% FEV1 56% FVC 51% DLCO 51% Fef 25/75 69%  6MWT WNL no evidence of drop in oxygen levels 6 months ago   10/01/20- FEV1 2.93 -  60%...  -Compliance report reviewed with patient today he is unable to use current interface with pressure and we will decrease pressure and re-evaluate compliance. He has residual AHI 24. He is aware that we may need to switch to full face mask to reach adequate AHI without leakage."    IMAGES: MRI L-spine 03/10/21: IMPRESSION: 1. Similar moderate to severe canal stenosis at L2-L3 and similar versus slightly progressed moderate canal stenosis at L1-L2 with prominent dorsal epidural fat contributing at these levels. 2. At L3-L4, slightly increased size of a superiorly dissecting right subarticular disc protrusion with resulting mildly progressive moderate to severe right subarticular recess stenosis and mild to moderate canal stenosis. Similar moderate bilateral foraminal stenosis at this level. 3. Similar severe bilateral foraminal stenosis at L4-L5 and L5-S1.    EKG:  EKG 04/14/21 [facility]): Per Narrative in [place] [facility], "Sinus rhythm  Baseline artifact  I reviewed and concur with this report. Electronically signed [id number], MD, ALEX [phone]) on 04/23/2021 8:10:14 AM"  EKG 10/28/20: SR with PACs   CV: US Carotid 01/01/21: Summary:  - Right Carotid: Velocities in the right ICA are consistent with a 1-39% stenosis. Widely patent ICA s/p stent with mild post procedure elevation of velocities.  - Left Carotid: Velocities in the left ICA are consistent with a 40-59% stenosis. Mild calcified plaque at the bulb with possible web in the proximal ICA increasing velocities but by color flow it appears widely patent.  - Vertebrals:  Bilateral vertebral arteries demonstrate antegrade flow.  - Subclavians: Normal flow hemodynamics were seen in bilateral subclavian arteries.   72 Hour Holter Monitor: Per 04/14/21 note by Dr. Saralyn Pilar, "72-hour Holter monitor was performed which revealed predominant sinus bradycardia with mean heart rate of 56 bpm, frequent premature ventricular  contractions, and intermittent atrial fibrillation with the longest episode lasting 12 hours and 40 minutes. The patient was asymptomatic."   Echo 08/05/20: IMPRESSIONS   1. Left ventricular ejection fraction, by estimation, is 55 to 60%. The  left ventricle has normal function. The left ventricle has no regional  wall motion abnormalities. Left ventricular diastolic parameters were  normal.   2. Right ventricular systolic function is normal. The right ventricular  size is normal.   3. The mitral valve is normal in structure. Mild mitral valve  regurgitation. No evidence of mitral stenosis.   4. The aortic valve is normal in structure. Aortic valve regurgitation is  not visualized. No aortic stenosis is present.   5. The inferior vena cava is normal in size with greater than 50%  respiratory variability, suggesting right atrial pressure of 3 mmHg.    Lexiscan Myoview 08/04/20: There was no ST segment deviation noted during stress. No T wave inversion was noted during stress. The study is normal. This is a low risk study. Nuclear stress EF: 57%. The left ventricular ejection fraction is normal (55-65%).   Conclusion Adequate chemical stress Await Myoview images - Per 04/14/21 note by Dr. Saralyn Pilar, "Lexiscan Myoview on 08/04/2020 revealed normal left ventricular function with no evidence of scar  or ischemia."   Past Medical History:  Diagnosis Date   Arthritis    lower left hip   Atypical angina (HCC)    Bilateral hand numbness    from back surgery   Bronchitis, chronic (HCC)    Cancer ([place])    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; Merkel cell cancer, and Basal cell cancer (twice; 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   Carotid stenosis    CKD (chronic kidney disease) stage 3, GFR 30-59 ml/min (HCC)    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   DDD (degenerative disc disease), cervical    Dysrhythmia    GERD (gastroesophageal reflux disease)    Hypercholesterolemia     Hypertension    Hypothyroidism    pt takes Levothyroxine daily   Lumbosacral spinal stenosis    Myasthenia gravis, adult form ([place])    PAD (peripheral artery disease) (HCC)    Shortness of breath    Lung MD- Dr Darlin Coco   Sleep apnea    do not use CPAP every night    Past Surgical History:  Procedure Laterality Date   ANTERIOR CERVICAL DECOMP/DISCECTOMY FUSION  07/18/2011   Procedure: ANTERIOR CERVICAL DECOMPRESSION/DISCECTOMY FUSION 2 LEVELS;  Surgeon: Cooper Render Pool;  Location: [place] [facility] ORS;  Service: Neurosurgery;  Laterality: N/A;  cervical five-six, cervical six-seven anterior cervical discectomy and fusion   BACK SURGERY     in [place]     01/2020 Right, 04/2020 Left   CARDIAC CATHETERIZATION     2005 at [facility], no stents   CAROTID PTA/STENT INTERVENTION N/A 09/17/2020   Procedure: CAROTID PTA/STENT INTERVENTION;  Surgeon: Algernon Huxley, MD;  Location: [place] CV LAB;  Service: Cardiovascular;  Laterality: N/A;   CATARACT EXTRACTION W/PHACO Left 01/06/2020   Procedure: CATARACT EXTRACTION PHACO AND INTRAOCULAR LENS PLACEMENT (IOC) ISTENT INJ LEFT 3.81  00:33.3;  Surgeon: Eulogio Bear, MD;  Location: [place];  Service: Ophthalmology;  Laterality: Left;   CATARACT EXTRACTION W/PHACO Right 02/03/2020   Procedure: CATARACT EXTRACTION PHACO AND INTRAOCULAR LENS PLACEMENT ([place]) RIGHT ISTENT INJ;  Surgeon: Eulogio Bear, MD;  Location: [place];  Service: Ophthalmology;  Laterality: Right;  4.29 0:35.6   COLONOSCOPY     HERNIA REPAIR Left    inguinal hernia repair in 1985   LUMBAR LAMINECTOMY/DECOMPRESSION MICRODISCECTOMY Left 02/24/2014   Procedure: LUMBAR LAMINECTOMY/DECOMPRESSION MICRODISCECTOMY LUMBAR THREE-FOUR, FOUR-FIVE, LEFT FIVE-SACRAL ONE ;  Surgeon: Charlie Pitter, MD;  Location: [place] NEURO ORS;  Service: Neurosurgery;  Laterality: Left;  LUMBAR LAMINECTOMY/DECOMPRESSION MICRODISCECTOMY LUMBAR  THREE-FOUR, FOUR-FIVE, LEFT FIVE-SACRAL ONE    POSTERIOR CERVICAL FUSION/FORAMINOTOMY N/A 08/07/2020   Procedure: C3-6 POSTERIOR FUSION WITH DECOMPRESSION;  Surgeon: Meade Maw, MD;  Location: [facility] ORS;  Service: Neurosurgery;  Laterality: N/A;   PROSTATECTOMY  8/14   [facility] Dr Mare Ferrari     MEDICATIONS:  apixaban (ELIQUIS) 5 MG TABS tablet   aspirin EC 81 MG EC tablet   atorvastatin (LIPITOR) 10 MG tablet   azaTHIOprine (IMURAN) 50 MG tablet   cephALEXin (KEFLEX) 500 MG capsule   Cholecalciferol (VITAMIN D3 PO)   clopidogrel (PLAVIX) 75 MG tablet   desmopressin (DDAVP) 0.2 MG tablet   furosemide (LASIX) 20 MG tablet   gabapentin (NEURONTIN) 300 MG capsule   HYDROcodone-acetaminophen (NORCO) 10-325 MG tablet   Ipratropium-Albuterol (COMBIVENT RESPIMAT) 20-100 MCG/ACT AERS respimat   iron polysaccharides (NIFEREX) 150 MG capsule   levothyroxine (SYNTHROID, LEVOTHROID) 88 MCG  tablet   Magnesium 500 MG TABS   pantoprazole (PROTONIX) 40 MG tablet   podofilox (CONDYLOX) 0.5 % external solution   predniSONE (DELTASONE) 10 MG tablet   predniSONE (DELTASONE) 20 MG tablet   pregabalin (LYRICA) 100 MG capsule   pyridostigmine (MESTINON) 60 MG tablet   rOPINIRole (REQUIP) 4 MG tablet   traZODone (DESYREL) 50 MG tablet   vitamin B-12 (CYANOCOBALAMIN) 1000 MCG tablet   No current facility-administered medications for this encounter.    Myra Gianotti, PA-C Surgical Short Stay/Anesthesiology [facility] Phone [phone] [facility] Phone [phone] 04/30/2021 5:02 PM

## 2021-04-30 NOTE — Progress Notes (Signed)
PCP: Ramonita Lab, MD Cardiologist: Isaias Cowman, MD Pulmonologist: Ottie Glazier, MD  EKG: 10/28/20 CXR: 02/20/14 ECHO: 08/05/20 Stress Test: denies Cardiac Cath: denies  Fasting Blood Sugar- na Checks Blood Sugar__na_ times a day  OSA/CPAP: Yes, patient states he uses an oxygen concentrator on some nights.  ASA: No Blood Thinner: Last dose Eliquis 04/29/21 and last dose Plavix 04/27/21.  Covid test 8/19 at PAT.   Anesthesia Review: Yes, blood thinner.  Patient denies shortness of breath, fever, cough, and chest pain at PAT appointment.  Patient verbalized understanding of instructions provided today at the PAT appointment.  Patient asked to review instructions at home and day of surgery.

## 2021-05-03 ENCOUNTER — Ambulatory Visit (HOSPITAL_COMMUNITY): Payer: Medicare Other

## 2021-05-03 ENCOUNTER — Ambulatory Visit (HOSPITAL_COMMUNITY): Payer: Medicare Other | Admitting: Vascular Surgery

## 2021-05-03 ENCOUNTER — Encounter (HOSPITAL_COMMUNITY): Payer: Self-pay | Admitting: Neurosurgery

## 2021-05-03 ENCOUNTER — Encounter (HOSPITAL_COMMUNITY)
Admission: RE | Disposition: A | Discharge status: Home / Self Care | Payer: Self-pay | Source: Home / Self Care | Attending: Neurosurgery

## 2021-05-03 ENCOUNTER — Observation Stay (HOSPITAL_COMMUNITY)
Admission: RE | Admit: 2021-05-03 | Discharge: 2021-05-04 | Disposition: A | Discharge status: Home / Self Care | Payer: Medicare Other | Attending: Neurosurgery | Admitting: Neurosurgery

## 2021-05-03 ENCOUNTER — Ambulatory Visit (HOSPITAL_COMMUNITY): Payer: Medicare Other | Admitting: Anesthesiology

## 2021-05-03 ENCOUNTER — Other Ambulatory Visit: Payer: Self-pay

## 2021-05-03 DIAGNOSIS — I129 Hypertensive chronic kidney disease with stage 1 through stage 4 chronic kidney disease, or unspecified chronic kidney disease: Secondary | ICD-10-CM | POA: Insufficient documentation

## 2021-05-03 DIAGNOSIS — Z87891 Personal history of nicotine dependence: Secondary | ICD-10-CM | POA: Insufficient documentation

## 2021-05-03 DIAGNOSIS — Z7982 Long term (current) use of aspirin: Secondary | ICD-10-CM | POA: Diagnosis not present

## 2021-05-03 DIAGNOSIS — E039 Hypothyroidism, unspecified: Secondary | ICD-10-CM | POA: Insufficient documentation

## 2021-05-03 DIAGNOSIS — Z419 Encounter for procedure for purposes other than remedying health state, unspecified: Secondary | ICD-10-CM

## 2021-05-03 DIAGNOSIS — Z8546 Personal history of malignant neoplasm of prostate: Secondary | ICD-10-CM | POA: Diagnosis not present

## 2021-05-03 DIAGNOSIS — Z85828 Personal history of other malignant neoplasm of skin: Secondary | ICD-10-CM | POA: Insufficient documentation

## 2021-05-03 DIAGNOSIS — M48062 Spinal stenosis, lumbar region with neurogenic claudication: Secondary | ICD-10-CM | POA: Diagnosis present

## 2021-05-03 DIAGNOSIS — Z79899 Other long term (current) drug therapy: Secondary | ICD-10-CM | POA: Insufficient documentation

## 2021-05-03 DIAGNOSIS — J449 Chronic obstructive pulmonary disease, unspecified: Secondary | ICD-10-CM | POA: Diagnosis not present

## 2021-05-03 DIAGNOSIS — N183 Chronic kidney disease, stage 3 unspecified: Secondary | ICD-10-CM | POA: Insufficient documentation

## 2021-05-03 HISTORY — PX: LUMBAR LAMINECTOMY/DECOMPRESSION MICRODISCECTOMY: SHX5026

## 2021-05-03 SURGERY — LUMBAR LAMINECTOMY/DECOMPRESSION MICRODISCECTOMY 1 LEVEL
Anesthesia: General | Site: Back

## 2021-05-03 MED ORDER — PROPOFOL 10 MG/ML IV BOLUS
INTRAVENOUS | Status: DC | PRN
  Administered 2021-05-03: 150 mg via INTRAVENOUS

## 2021-05-03 MED ORDER — FUROSEMIDE 40 MG PO TABS
40.0000 mg | ORAL_TABLET | Freq: Every day | ORAL | Status: DC
  Administered 2021-05-03: 40 mg via ORAL
  Filled 2021-05-03: qty 1

## 2021-05-03 MED ORDER — CHLORHEXIDINE GLUCONATE 0.12 % MT SOLN: 15.0000 mL | Freq: Once | OROMUCOSAL | Status: AC

## 2021-05-03 MED ORDER — THROMBIN (RECOMBINANT) 5000 UNITS EX SOLR
CUTANEOUS | Status: AC
  Filled 2021-05-03: qty 10000

## 2021-05-03 MED ORDER — CEFAZOLIN SODIUM-DEXTROSE 1-4 GM/50ML-% IV SOLN
1.0000 g | Freq: Three times a day (TID) | INTRAVENOUS | Status: AC
Start: 2021-05-03 — End: 2021-05-04
  Administered 2021-05-03 – 2021-05-04 (×2): 1 g via INTRAVENOUS
  Filled 2021-05-03 (×2): qty 50

## 2021-05-03 MED ORDER — ORAL CARE MOUTH RINSE: 15.0000 mL | Freq: Once | OROMUCOSAL | Status: AC

## 2021-05-03 MED ORDER — SODIUM CHLORIDE 0.9 % IV SOLN: 250.0000 mL | INTRAVENOUS | Status: DC

## 2021-05-03 MED ORDER — ROPINIROLE HCL 1 MG PO TABS
4.0000 mg | ORAL_TABLET | Freq: Every day | ORAL | Status: DC
  Administered 2021-05-03: 4 mg via ORAL
  Filled 2021-05-03: qty 4

## 2021-05-03 MED ORDER — CHLORHEXIDINE GLUCONATE CLOTH 2 % EX PADS: 6.0000 | MEDICATED_PAD | Freq: Once | CUTANEOUS | Status: DC

## 2021-05-03 MED ORDER — VITAMIN B-12 1000 MCG PO TABS
1000.0000 ug | ORAL_TABLET | Freq: Every day | ORAL | Status: DC
  Administered 2021-05-03 – 2021-05-04 (×2): 1000 ug via ORAL
  Filled 2021-05-03 (×2): qty 1

## 2021-05-03 MED ORDER — AZATHIOPRINE 50 MG PO TABS
150.0000 mg | ORAL_TABLET | Freq: Every day | ORAL | Status: DC
  Administered 2021-05-04: 150 mg via ORAL
  Filled 2021-05-03: qty 3

## 2021-05-03 MED ORDER — VECURONIUM BROMIDE 10 MG IV SOLR
INTRAVENOUS | Status: DC | PRN
  Administered 2021-05-03: 5 mg via INTRAVENOUS

## 2021-05-03 MED ORDER — FENTANYL CITRATE (PF) 100 MCG/2ML IJ SOLN
INTRAMUSCULAR | Status: AC
  Administered 2021-05-03: 50 ug
  Filled 2021-05-03: qty 2

## 2021-05-03 MED ORDER — CHLORHEXIDINE GLUCONATE 0.12 % MT SOLN
OROMUCOSAL | Status: AC
  Administered 2021-05-03: 15 mL via OROMUCOSAL
  Filled 2021-05-03: qty 15

## 2021-05-03 MED ORDER — CYCLOBENZAPRINE HCL 10 MG PO TABS: 10.0000 mg | ORAL_TABLET | Freq: Three times a day (TID) | ORAL | Status: DC | PRN

## 2021-05-03 MED ORDER — DESMOPRESSIN ACETATE 0.2 MG PO TABS
0.2000 mg | ORAL_TABLET | Freq: Every day | ORAL | Status: DC
  Administered 2021-05-03: 0.2 mg via ORAL
  Filled 2021-05-03 (×2): qty 1

## 2021-05-03 MED ORDER — PODOFILOX 0.5 % EX SOLN: 1.0000 "application " | CUTANEOUS | Status: DC

## 2021-05-03 MED ORDER — HYDROCODONE-ACETAMINOPHEN 10-325 MG PO TABS: 1.0000 | ORAL_TABLET | Freq: Every day | ORAL | Status: DC

## 2021-05-03 MED ORDER — EPHEDRINE SULFATE 50 MG/ML IJ SOLN
INTRAMUSCULAR | Status: DC | PRN
  Administered 2021-05-03: 15 mg via INTRAVENOUS

## 2021-05-03 MED ORDER — CEFAZOLIN SODIUM-DEXTROSE 2-4 GM/100ML-% IV SOLN
2.0000 g | INTRAVENOUS | Status: AC
Start: 2021-05-03 — End: 2021-05-03
  Administered 2021-05-03: 2 g via INTRAVENOUS

## 2021-05-03 MED ORDER — ACETAMINOPHEN 10 MG/ML IV SOLN
INTRAVENOUS | Status: AC
  Filled 2021-05-03: qty 100

## 2021-05-03 MED ORDER — SODIUM CHLORIDE 0.9% FLUSH: 3.0000 mL | INTRAVENOUS | Status: DC | PRN

## 2021-05-03 MED ORDER — THROMBIN 5000 UNITS EX SOLR
CUTANEOUS | Status: AC
  Filled 2021-05-03: qty 10000

## 2021-05-03 MED ORDER — LACTATED RINGERS IV SOLN: INTRAVENOUS | Status: DC | PRN

## 2021-05-03 MED ORDER — SODIUM CHLORIDE 0.9% FLUSH: 3.0000 mL | Freq: Two times a day (BID) | INTRAVENOUS | Status: DC

## 2021-05-03 MED ORDER — TRAZODONE HCL 50 MG PO TABS
50.0000 mg | ORAL_TABLET | Freq: Every day | ORAL | Status: DC
  Administered 2021-05-03: 50 mg via ORAL
  Filled 2021-05-03: qty 1

## 2021-05-03 MED ORDER — KETOROLAC TROMETHAMINE 30 MG/ML IJ SOLN
INTRAMUSCULAR | Status: DC | PRN
  Administered 2021-05-03: 30 mg via INTRAVENOUS

## 2021-05-03 MED ORDER — FENTANYL CITRATE (PF) 100 MCG/2ML IJ SOLN: 25.0000 ug | INTRAMUSCULAR | Status: DC | PRN

## 2021-05-03 MED ORDER — ATORVASTATIN CALCIUM 10 MG PO TABS
10.0000 mg | ORAL_TABLET | Freq: Every day | ORAL | Status: DC
  Administered 2021-05-04: 10 mg via ORAL
  Filled 2021-05-03: qty 1

## 2021-05-03 MED ORDER — VITAMIN D 25 MCG (1000 UNIT) PO TABS
1000.0000 [IU] | ORAL_TABLET | Freq: Every day | ORAL | Status: DC
Start: 2021-05-03 — End: 2021-05-04
  Administered 2021-05-03 – 2021-05-04 (×2): 1000 [IU] via ORAL
  Filled 2021-05-03 (×4): qty 1

## 2021-05-03 MED ORDER — GLYCOPYRROLATE PF 0.2 MG/ML IJ SOSY
PREFILLED_SYRINGE | INTRAMUSCULAR | Status: DC | PRN
  Administered 2021-05-03: .4 mg via INTRAVENOUS

## 2021-05-03 MED ORDER — GABAPENTIN 300 MG PO CAPS
300.0000 mg | ORAL_CAPSULE | Freq: Two times a day (BID) | ORAL | Status: DC
  Administered 2021-05-03 – 2021-05-04 (×2): 300 mg via ORAL
  Filled 2021-05-03 (×2): qty 1

## 2021-05-03 MED ORDER — KETOROLAC TROMETHAMINE 15 MG/ML IJ SOLN
7.5000 mg | Freq: Four times a day (QID) | INTRAMUSCULAR | Status: DC
  Administered 2021-05-03 – 2021-05-04 (×3): 7.5 mg via INTRAVENOUS
  Filled 2021-05-03 (×3): qty 1

## 2021-05-03 MED ORDER — DEXAMETHASONE SODIUM PHOSPHATE 10 MG/ML IJ SOLN
INTRAMUSCULAR | Status: AC
  Filled 2021-05-03: qty 2

## 2021-05-03 MED ORDER — THROMBIN 5000 UNITS EX SOLR
CUTANEOUS | Status: DC | PRN
  Administered 2021-05-03: 10000 [IU] via TOPICAL

## 2021-05-03 MED ORDER — LACTATED RINGERS IV SOLN: INTRAVENOUS | Status: DC

## 2021-05-03 MED ORDER — PANTOPRAZOLE SODIUM 40 MG PO TBEC
40.0000 mg | DELAYED_RELEASE_TABLET | Freq: Two times a day (BID) | ORAL | Status: DC
  Administered 2021-05-03 – 2021-05-04 (×2): 40 mg via ORAL
  Filled 2021-05-03 (×2): qty 1

## 2021-05-03 MED ORDER — ROCURONIUM BROMIDE 10 MG/ML (PF) SYRINGE
PREFILLED_SYRINGE | INTRAVENOUS | Status: AC
  Filled 2021-05-03: qty 10

## 2021-05-03 MED ORDER — MAGNESIUM OXIDE -MG SUPPLEMENT 400 (240 MG) MG PO TABS
400.0000 mg | ORAL_TABLET | Freq: Every day | ORAL | Status: DC
  Administered 2021-05-03: 400 mg via ORAL
  Filled 2021-05-03: qty 1

## 2021-05-03 MED ORDER — PHENOL 1.4 % MT LIQD: 1.0000 | OROMUCOSAL | Status: DC | PRN

## 2021-05-03 MED ORDER — FENTANYL CITRATE (PF) 100 MCG/2ML IJ SOLN
INTRAMUSCULAR | Status: DC | PRN
  Administered 2021-05-03: 50 ug via INTRAVENOUS
  Administered 2021-05-03 (×2): 100 ug via INTRAVENOUS

## 2021-05-03 MED ORDER — PROPOFOL 10 MG/ML IV BOLUS
INTRAVENOUS | Status: AC
  Filled 2021-05-03: qty 20

## 2021-05-03 MED ORDER — EPHEDRINE 5 MG/ML INJ
INTRAVENOUS | Status: AC
  Filled 2021-05-03: qty 15

## 2021-05-03 MED ORDER — DEXAMETHASONE SODIUM PHOSPHATE 10 MG/ML IJ SOLN: 10.0000 mg | Freq: Once | INTRAMUSCULAR | Status: DC

## 2021-05-03 MED ORDER — HYDROMORPHONE HCL 1 MG/ML IJ SOLN: 1.0000 mg | INTRAMUSCULAR | Status: DC | PRN

## 2021-05-03 MED ORDER — PHENYLEPHRINE HCL-NACL 20-0.9 MG/250ML-% IV SOLN
INTRAVENOUS | Status: DC | PRN
Start: 2021-05-03 — End: 2021-05-03
  Administered 2021-05-03: 35 ug/min via INTRAVENOUS

## 2021-05-03 MED ORDER — PHENYLEPHRINE 40 MCG/ML (10ML) SYRINGE FOR IV PUSH (FOR BLOOD PRESSURE SUPPORT)
PREFILLED_SYRINGE | INTRAVENOUS | Status: AC
  Filled 2021-05-03: qty 20

## 2021-05-03 MED ORDER — MENTHOL 3 MG MT LOZG: 1.0000 | LOZENGE | OROMUCOSAL | Status: DC | PRN

## 2021-05-03 MED ORDER — IPRATROPIUM-ALBUTEROL 0.5-2.5 (3) MG/3ML IN SOLN: 3.0000 mL | Freq: Four times a day (QID) | RESPIRATORY_TRACT | Status: DC | PRN

## 2021-05-03 MED ORDER — 0.9 % SODIUM CHLORIDE (POUR BTL) OPTIME
TOPICAL | Status: DC | PRN
  Administered 2021-05-03: 1000 mL

## 2021-05-03 MED ORDER — ACETAMINOPHEN 325 MG PO TABS
650.0000 mg | ORAL_TABLET | ORAL | Status: DC | PRN
  Administered 2021-05-03: 650 mg via ORAL
  Filled 2021-05-03: qty 2

## 2021-05-03 MED ORDER — PYRIDOSTIGMINE BROMIDE 60 MG PO TABS
60.0000 mg | ORAL_TABLET | Freq: Two times a day (BID) | ORAL | Status: DC
  Administered 2021-05-03 – 2021-05-04 (×2): 60 mg via ORAL
  Filled 2021-05-03 (×3): qty 1

## 2021-05-03 MED ORDER — PREDNISONE 20 MG PO TABS
30.0000 mg | ORAL_TABLET | ORAL | Status: DC
  Administered 2021-05-03: 30 mg via ORAL
  Filled 2021-05-03: qty 1

## 2021-05-03 MED ORDER — FENTANYL CITRATE (PF) 250 MCG/5ML IJ SOLN
INTRAMUSCULAR | Status: AC
  Filled 2021-05-03: qty 5

## 2021-05-03 MED ORDER — ONDANSETRON HCL 4 MG/2ML IJ SOLN: 4.0000 mg | Freq: Four times a day (QID) | INTRAMUSCULAR | Status: DC | PRN

## 2021-05-03 MED ORDER — ACETAMINOPHEN 10 MG/ML IV SOLN
1000.0000 mg | Freq: Once | INTRAVENOUS | Status: AC
  Administered 2021-05-03: 1000 mg via INTRAVENOUS

## 2021-05-03 MED ORDER — ONDANSETRON HCL 4 MG PO TABS: 4.0000 mg | ORAL_TABLET | Freq: Four times a day (QID) | ORAL | Status: DC | PRN

## 2021-05-03 MED ORDER — OXYCODONE HCL 5 MG PO TABS: 5.0000 mg | ORAL_TABLET | Freq: Once | ORAL | Status: DC | PRN

## 2021-05-03 MED ORDER — CEFAZOLIN SODIUM-DEXTROSE 2-4 GM/100ML-% IV SOLN
INTRAVENOUS | Status: AC
  Filled 2021-05-03: qty 100

## 2021-05-03 MED ORDER — BUPIVACAINE HCL (PF) 0.25 % IJ SOLN
INTRAMUSCULAR | Status: DC | PRN
  Administered 2021-05-03: 20 mL

## 2021-05-03 MED ORDER — PREDNISONE 10 MG PO TABS
10.0000 mg | ORAL_TABLET | ORAL | Status: DC
  Administered 2021-05-04: 10 mg via ORAL
  Filled 2021-05-03: qty 1

## 2021-05-03 MED ORDER — LIDOCAINE 2% (20 MG/ML) 5 ML SYRINGE
INTRAMUSCULAR | Status: AC
  Filled 2021-05-03: qty 10

## 2021-05-03 MED ORDER — BUPIVACAINE HCL (PF) 0.25 % IJ SOLN
INTRAMUSCULAR | Status: AC
  Filled 2021-05-03: qty 30

## 2021-05-03 MED ORDER — LEVOTHYROXINE SODIUM 88 MCG PO TABS
88.0000 ug | ORAL_TABLET | Freq: Every day | ORAL | Status: DC
  Administered 2021-05-04: 88 ug via ORAL
  Filled 2021-05-03: qty 1

## 2021-05-03 MED ORDER — PHENYLEPHRINE HCL (PRESSORS) 10 MG/ML IV SOLN
INTRAVENOUS | Status: DC | PRN
  Administered 2021-05-03: 100 ug via INTRAVENOUS

## 2021-05-03 MED ORDER — CHLORHEXIDINE GLUCONATE CLOTH 2 % EX PADS
6.0000 | MEDICATED_PAD | Freq: Once | CUTANEOUS | Status: DC
Start: 2021-05-03 — End: 2021-05-03

## 2021-05-03 MED ORDER — OXYCODONE HCL 5 MG/5ML PO SOLN
5.0000 mg | Freq: Once | ORAL | Status: DC | PRN
Start: 2021-05-03 — End: 2021-05-03

## 2021-05-03 MED ORDER — SUGAMMADEX SODIUM 200 MG/2ML IV SOLN
INTRAVENOUS | Status: DC | PRN
  Administered 2021-05-03: 200 mg via INTRAVENOUS

## 2021-05-03 MED ORDER — ONDANSETRON HCL 4 MG/2ML IJ SOLN: 4.0000 mg | Freq: Once | INTRAMUSCULAR | Status: DC | PRN

## 2021-05-03 MED ORDER — HYDROCODONE-ACETAMINOPHEN 10-325 MG PO TABS: 1.0000 | ORAL_TABLET | ORAL | Status: DC | PRN

## 2021-05-03 MED ORDER — ACETAMINOPHEN 650 MG RE SUPP: 650.0000 mg | RECTAL | Status: DC | PRN

## 2021-05-03 MED ORDER — LIDOCAINE HCL (CARDIAC) PF 100 MG/5ML IV SOSY
PREFILLED_SYRINGE | INTRAVENOUS | Status: DC | PRN
  Administered 2021-05-03: 30 mg via INTRAVENOUS

## 2021-05-03 MED ORDER — ONDANSETRON HCL 4 MG/2ML IJ SOLN
INTRAMUSCULAR | Status: AC
  Filled 2021-05-03: qty 4

## 2021-05-03 SURGICAL SUPPLY — 51 items
BAG COUNTER SPONGE SURGICOUNT (BAG) ×2 IMPLANT
BAG DECANTER FOR FLEXI CONT (MISCELLANEOUS) ×2 IMPLANT
BAND RUBBER #18 3X1/16 STRL (MISCELLANEOUS) ×4 IMPLANT
BENZOIN TINCTURE PRP APPL 2/3 (GAUZE/BANDAGES/DRESSINGS) ×2 IMPLANT
BLADE CLIPPER SURG (BLADE) IMPLANT
BUR CUTTER 7.0 ROUND (BURR) ×2 IMPLANT
CANISTER SUCT 3000ML PPV (MISCELLANEOUS) ×2 IMPLANT
CARTRIDGE OIL MAESTRO DRILL (MISCELLANEOUS) ×1 IMPLANT
CLSR STERI-STRIP ANTIMIC 1/2X4 (GAUZE/BANDAGES/DRESSINGS) ×2 IMPLANT
DECANTER SPIKE VIAL GLASS SM (MISCELLANEOUS) ×2 IMPLANT
DERMABOND ADHESIVE PROPEN (GAUZE/BANDAGES/DRESSINGS) ×1
DERMABOND ADVANCED (GAUZE/BANDAGES/DRESSINGS) ×1
DERMABOND ADVANCED .7 DNX12 (GAUZE/BANDAGES/DRESSINGS) ×1 IMPLANT
DERMABOND ADVANCED .7 DNX6 (GAUZE/BANDAGES/DRESSINGS) ×1 IMPLANT
DIFFUSER DRILL AIR PNEUMATIC (MISCELLANEOUS) ×2 IMPLANT
DRAPE HALF SHEET 40X57 (DRAPES) IMPLANT
DRAPE LAPAROTOMY 100X72X124 (DRAPES) ×2 IMPLANT
DRAPE MICROSCOPE LEICA (MISCELLANEOUS) ×2 IMPLANT
DRAPE SURG 17X23 STRL (DRAPES) ×4 IMPLANT
DRSG OPSITE 4X5.5 SM (GAUZE/BANDAGES/DRESSINGS) ×2 IMPLANT
DURAPREP 26ML APPLICATOR (WOUND CARE) ×2 IMPLANT
ELECT REM PT RETURN 9FT ADLT (ELECTROSURGICAL) ×2
ELECTRODE REM PT RTRN 9FT ADLT (ELECTROSURGICAL) ×1 IMPLANT
GAUZE 4X4 16PLY ~~LOC~~+RFID DBL (SPONGE) ×2 IMPLANT
GAUZE SPONGE 4X4 12PLY STRL (GAUZE/BANDAGES/DRESSINGS) ×2 IMPLANT
GLOVE EXAM NITRILE XL STR (GLOVE) IMPLANT
GLOVE SURG ENC MOIS LTX SZ6.5 (GLOVE) ×2 IMPLANT
GLOVE SURG LTX SZ9 (GLOVE) ×2 IMPLANT
GLOVE SURG UNDER POLY LF SZ6.5 (GLOVE) ×2 IMPLANT
GOWN STRL REUS W/ TWL LRG LVL3 (GOWN DISPOSABLE) ×1 IMPLANT
GOWN STRL REUS W/ TWL XL LVL3 (GOWN DISPOSABLE) ×1 IMPLANT
GOWN STRL REUS W/TWL 2XL LVL3 (GOWN DISPOSABLE) ×2 IMPLANT
GOWN STRL REUS W/TWL LRG LVL3 (GOWN DISPOSABLE) ×1
GOWN STRL REUS W/TWL XL LVL3 (GOWN DISPOSABLE) ×1
KIT BASIN OR (CUSTOM PROCEDURE TRAY) ×2 IMPLANT
KIT TURNOVER KIT B (KITS) ×2 IMPLANT
NEEDLE HYPO 22GX1.5 SAFETY (NEEDLE) ×2 IMPLANT
NEEDLE SPNL 22GX3.5 QUINCKE BK (NEEDLE) IMPLANT
NS IRRIG 1000ML POUR BTL (IV SOLUTION) ×2 IMPLANT
OIL CARTRIDGE MAESTRO DRILL (MISCELLANEOUS) ×2
PACK LAMINECTOMY NEURO (CUSTOM PROCEDURE TRAY) ×2 IMPLANT
PAD ARMBOARD 7.5X6 YLW CONV (MISCELLANEOUS) ×6 IMPLANT
PENCIL BUTTON HOLSTER BLD 10FT (ELECTRODE) ×4 IMPLANT
SPONGE SURGIFOAM ABS GEL SZ50 (HEMOSTASIS) ×2 IMPLANT
SPONGE T-LAP 4X18 ~~LOC~~+RFID (SPONGE) ×4 IMPLANT
STRIP CLOSURE SKIN 1/2X4 (GAUZE/BANDAGES/DRESSINGS) ×2 IMPLANT
SUT VIC AB 2-0 CT1 18 (SUTURE) ×2 IMPLANT
SUT VIC AB 3-0 SH 8-18 (SUTURE) ×2 IMPLANT
TOWEL GREEN STERILE (TOWEL DISPOSABLE) ×2 IMPLANT
TOWEL GREEN STERILE FF (TOWEL DISPOSABLE) ×2 IMPLANT
WATER STERILE IRR 1000ML POUR (IV SOLUTION) ×2 IMPLANT

## 2021-05-03 NOTE — H&P (Signed)
Francisco [name] is an 75 y.o. male.   Chief Complaint: Numbness HPI: 75 year old male with progressive bilateral lower extremity numbness and weakness consistent with neurogenic claudication.  Patient status post prior decompressive surgery from L3-L5.  Patient presents now with worsening stenosis at L2-3.  Patient is failed conservative management presents now for L2-3 decompressive laminectomy in hopes of improving his symptoms.  Past Medical History:  Diagnosis Date   Arthritis    lower left hip   Atypical angina (HCC)    Bilateral hand numbness    from back surgery   Bronchitis, chronic (HCC)    Cancer ([place])    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; Merkel cell cancer, and Basal cell cancer (twice; 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   Carotid stenosis    CKD (chronic kidney disease)    CKD (chronic kidney disease) stage 3, GFR 30-59 ml/min (HCC)    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   DDD (degenerative disc disease), cervical    Dysrhythmia    post carotid stent bradycardia; PAF 09/2020   GERD (gastroesophageal reflux disease)    Hypercholesterolemia    Hypertension    Hypothyroidism    pt takes Levothyroxine daily   Lumbosacral spinal stenosis    Myasthenia gravis, adult form ([place])    PAD (peripheral artery disease) ([place])    Shortness of breath    Lung MD- Dr Darlin Coco   Sleep apnea    do not use CPAP every night    Past Surgical History:  Procedure Laterality Date   ANTERIOR CERVICAL DECOMP/DISCECTOMY FUSION  07/18/2011   Procedure: ANTERIOR CERVICAL DECOMPRESSION/DISCECTOMY FUSION 2 LEVELS;  Surgeon: Cooper Render Lynnetta Tom;  Location: [place] NE[facility] ORS;  Service: Neurosurgery;  Laterality: N/A;  cervical five-six, cervical six-seven anterior cervical discectomy and fusion   BACK SURGERY     in [place]     01/2020 Right, 04/2020 Left   CARDIAC CATHETERIZATION     2005 at [facility], no stents   CAROTID PTA/STENT INTERVENTION N/A 09/17/2020    Procedure: CAROTID PTA/STENT INTERVENTION;  Surgeon: Algernon Huxley, MD;  Location: [place] CV LAB;  Service: Cardiovascular;  Laterality: N/A;   CATARACT EXTRACTION W/PHACO Left 01/06/2020   Procedure: CATARACT EXTRACTION PHACO AND INTRAOCULAR LENS PLACEMENT (IOC) ISTENT INJ LEFT 3.81  00:33.3;  Surgeon: Eulogio Bear, MD;  Location: [place];  Service: Ophthalmology;  Laterality: Left;   CATARACT EXTRACTION W/PHACO Right 02/03/2020   Procedure: CATARACT EXTRACTION PHACO AND INTRAOCULAR LENS PLACEMENT ([place]) RIGHT ISTENT INJ;  Surgeon: Eulogio Bear, MD;  Location: [place];  Service: Ophthalmology;  Laterality: Right;  4.29 0:35.6   COLONOSCOPY     HERNIA REPAIR Left    inguinal hernia repair in 1985   LUMBAR LAMINECTOMY/DECOMPRESSION MICRODISCECTOMY Left 02/24/2014   Procedure: LUMBAR LAMINECTOMY/DECOMPRESSION MICRODISCECTOMY LUMBAR THREE-FOUR, FOUR-FIVE, LEFT FIVE-SACRAL ONE ;  Surgeon: Charlie Pitter, MD;  Location: [place] NEURO ORS;  Service: Neurosurgery;  Laterality: Left;  LUMBAR LAMINECTOMY/DECOMPRESSION MICRODISCECTOMY LUMBAR THREE-FOUR, FOUR-FIVE, LEFT FIVE-SACRAL ONE    POSTERIOR CERVICAL FUSION/FORAMINOTOMY N/A 08/07/2020   Procedure: C3-6 POSTERIOR FUSION WITH DECOMPRESSION;  Surgeon: Meade Maw, MD;  Location: [facility] ORS;  Service: Neurosurgery;  Laterality: N/A;   PROSTATECTOMY  8/14   [facility] Dr Mare Ferrari     Family History  Problem Relation Age of Onset   Hypertension Mother    Stroke Mother    Stroke Father    Social History:  reports that he quit smoking about 19 years ago. His smoking use included cigarettes. He has a 20.00 pack-year smoking history. He has never used smokeless tobacco. He reports current alcohol use of about 3.0 standard drinks per week. He reports that he does not use drugs.  Allergies:  Allergies  Allergen Reactions   Azithromycin Other (See Comments)    Avoid due to myasthenia gravis   Codeine Nausea And  Vomiting    Medications Prior to Admission  Medication Sig Dispense Refill   apixaban (ELIQUIS) 5 MG TABS tablet Take 5 mg by mouth 2 (two) times daily.     atorvastatin (LIPITOR) 10 MG tablet Take 1 tablet (10 mg total) by mouth daily. 30 tablet 10   azaTHIOprine (IMURAN) 50 MG tablet Take 150 mg by mouth daily.      Cholecalciferol (VITAMIN D3 PO) Take 1 capsule by mouth daily.     clopidogrel (PLAVIX) 75 MG tablet Take 1 tablet (75 mg total) by mouth daily at 6 (six) AM. 30 tablet 10   desmopressin (DDAVP) 0.2 MG tablet Take 0.2 mg by mouth at bedtime.     furosemide (LASIX) 20 MG tablet Take 40 mg by mouth daily.     gabapentin (NEURONTIN) 300 MG capsule Take 300 mg by mouth 2 (two) times daily.     HYDROcodone-acetaminophen (NORCO) 10-325 MG tablet Take 1-2 tablets by mouth See admin instructions. Take 1 to 2 tablets every morning, may take 1 tablet every 6 hours as needed for pain     Ipratropium-Albuterol (COMBIVENT RESPIMAT) 20-100 MCG/ACT AERS respimat Inhale 1 puff into the lungs every 6 (six) hours as needed for wheezing.     levothyroxine (SYNTHROID, LEVOTHROID) 88 MCG tablet Take 88 mcg by mouth daily before breakfast.     Magnesium 500 MG TABS Take 500 mg by mouth at bedtime.     pantoprazole (PROTONIX) 40 MG tablet Take 40 mg by mouth 2 (two) times daily before a meal.      podofilox (CONDYLOX) 0.5 % external solution Apply 1 application topically See admin instructions. Apply to affected area Monday through Thursday     predniSONE (DELTASONE) 10 MG tablet Take 10 mg by mouth every other day.     predniSONE (DELTASONE) 20 MG tablet Take 30 mg by mouth every other day.     pyridostigmine (MESTINON) 60 MG tablet Take 60 mg by mouth in the morning and at bedtime.      rOPINIRole (REQUIP) 4 MG tablet Take 4 mg by mouth at bedtime.      traZODone (DESYREL) 50 MG tablet Take 0.5 tablets (25 mg total) by mouth at bedtime as needed for sleep. (Patient taking differently: Take 50 mg by  mouth at bedtime.) 15 tablet 0   vitamin B-12 (CYANOCOBALAMIN) 1000 MCG tablet Take 1,000 mcg by mouth daily.     aspirin EC 81 MG EC tablet Take 1 tablet (81 mg total) by mouth daily at 6 (six) AM. Swallow whole. (Patient not taking: No sig reported) 30 tablet 11   cephALEXin (KEFLEX) 500 MG capsule Take 2 capsules (1,000 mg total) by mouth 2 (two) times daily. (Patient not taking: No sig reported) 28 capsule 0   iron polysaccharides (NIFEREX) 150 MG capsule Take 1 capsule (150 mg total) by mouth 2 (two) times daily before lunch and supper. (Patient not taking: No sig reported) 60 capsule 0   pregabalin (LYRICA) 100 MG capsule Take 1 capsule (100 mg total) by mouth 2 (two) times  daily. (Patient not taking: No sig reported) 60 capsule 0    No results found for this or any previous visit (from the past 48 hour(s)). No results found.  Pertinent items noted in HPI and remainder of comprehensive ROS otherwise negative.  Blood pressure (!) 177/73, pulse 65, temperature 98.1 F (36.7 C), temperature source Oral, resp. rate 18, height '5\' 10"'$  (1.778 m), weight 107 kg, SpO2 96 %.  Patient is awake and alert.  He is oriented and appropriate.  Speech is fluent.  Judgment insight are intact.  Cranial nerve function normal bilateral.  Motor examination reveals some mild weakness of dorsiflexion in his left lower extremity otherwise motor strength intact.  Sensory examination some patchy distal sensory loss in both distal lower extremities.  Reflexes are hypoactive but symmetric.  No evidence of long track signs.  Posture is moderately flexed.  Gait is antalgic.  Examination head ears eyes nose and throat is unremarkable chest and abdomen are benign.  Extremities are free of major deformity. Assessment/Plan L2-3 stenosis with neurogenic claudication.  Plan L2-3 decompressive laminectomy with foraminotomies.  Risks and benefits been explained.  Patient wishes to proceed.  Mallie Mussel A Tennyson Wacha 05/03/2021, 1:11  PM

## 2021-05-03 NOTE — Anesthesia Procedure Notes (Signed)
Procedure Name: Intubation Date/Time: 05/03/2021 2:13 PM Performed by: Eligha Bridegroom, CRNA Pre-anesthesia Checklist: Patient identified, Emergency Drugs available, Suction available, Patient being monitored and Timeout performed Patient Re-evaluated:Patient Re-evaluated prior to induction Oxygen Delivery Method: Circle system utilized Preoxygenation: Pre-oxygenation with 100% oxygen Induction Type: IV induction Ventilation: Mask ventilation without difficulty and Oral airway inserted - appropriate to patient size Laryngoscope Size: Mac and 4 Grade View: Grade II Tube type: Oral Tube size: 7.5 mm Number of attempts: 1 Airway Equipment and Method: Stylet Secured at: 22 cm Tube secured with: Tape Dental Injury: Teeth and Oropharynx as per pre-operative assessment

## 2021-05-03 NOTE — Brief Op Note (Signed)
05/03/2021  3:33 PM  PATIENT:  Francisco [name]  75 y.o. male  PRE-OPERATIVE DIAGNOSIS:  Stenosis  POST-OPERATIVE DIAGNOSIS:  * No post-op diagnosis entered *  PROCEDURE:  Procedure(s) with comments: Laminectomy and Foraminotomy - L2-L3 (N/A) - 3C  SURGEON:  Surgeon(s) and Role:    * Earnie Larsson, MD - Primary  PHYSICIAN ASSISTANT:   ASSISTANTSMearl Latin   ANESTHESIA:   general  EBL:  125 mL   BLOOD ADMINISTERED:none  DRAINS: none   LOCAL MEDICATIONS USED:  MARCAINE     SPECIMEN:  No Specimen  DISPOSITION OF SPECIMEN:  N/A  COUNTS:  YES  TOURNIQUET:  * No tourniquets in log *  DICTATION: .Dragon Dictation  PLAN OF CARE: Admit for overnight observation  PATIENT DISPOSITION:  PACU - hemodynamically stable.   Delay start of Pharmacological VTE agent (>24hrs) due to surgical blood loss or risk of bleeding: yes

## 2021-05-03 NOTE — Transfer of Care (Signed)
Immediate Anesthesia Transfer of Care Note  Patient: Francisco [name]  Procedure(s) Performed: Laminectomy and Foraminotomy - L2-L3 (Back)  Patient Location: PACU  Anesthesia Type:General  Level of Consciousness: awake, alert  and oriented  Airway & Oxygen Therapy: Patient Spontanous Breathing and Patient connected to nasal cannula oxygen  Post-op Assessment: Report given to RN and Post -op Vital signs reviewed and stable  Post vital signs: Reviewed and stable  Last Vitals:  Vitals Value Taken Time  BP 168/84 05/03/21 1549  Temp    Pulse 89 05/03/21 1553  Resp 14 05/03/21 1553  SpO2  89% 05/03/21 1553  Vitals shown include unvalidated device data.  Last Pain:  Vitals:   05/03/21 1143  TempSrc:   PainSc: 0-No pain         Complications: No notable events documented.

## 2021-05-03 NOTE — Op Note (Signed)
Date of procedure: 05/03/2021  Date of dictation: Same  Service: Neurosurgery  Preoperative diagnosis: L2-3 stenosis with neurogenic claudication  Postoperative diagnosis: Same  Procedure Name: L2-3 decompressive laminectomy with foraminotomies  Surgeon:Harpreet Pompey A.Liany Mumpower, M.D.  Asst. Surgeon: Reinaldo Meeker, NP  Anesthesia: General  Indication: 75 year old male status post prior L3-S1 decompression presents now with worsening bilateral lower extremity numbness paresthesias and some weakness.  Work-up demonstrates evidence of progressive stenosis at L2-3.  Patient presents now for decompressive surgery.  Operative note: After induction of anesthesia, patient position prone on the Wilson frame and properly padded.  Lumbar region prepped and draped sterilely.  Incision made overlying L2-3.  Dissection performed bilaterally.  Retractor placed.  X-ray taken.  Level confirmed.  Decompressive laminectomies and performed using Leksell rongeurs and Kerrison rongeurs to remove the inferior three fourths of the lamina of L2, the superior one half of the lamina of L3 and the medial aspect of the L2-3 facet joint.  Ligament flavum elevated and resected.  Gutters were undercut.  Decompressive foraminotomies were performed on the course exiting L2 and L3 nerve roots.  At this point a very thorough decompression been achieved.  There was no evidence of injury to the thecal sac and nerve roots.  Wound was then irrigated..  Gelfoam was placed topically.  Wounds then closed and will typical fashion.  Steri-Strips and sterile dressing were applied.  No apparent complications.  Patient tolerated the procedure well and he returns to the recovery room postop

## 2021-05-04 ENCOUNTER — Encounter (HOSPITAL_COMMUNITY): Payer: Self-pay | Admitting: Neurosurgery

## 2021-05-04 DIAGNOSIS — M48062 Spinal stenosis, lumbar region with neurogenic claudication: Secondary | ICD-10-CM | POA: Diagnosis not present

## 2021-05-04 MED ORDER — CLOPIDOGREL BISULFATE 75 MG PO TABS: 75.0000 mg | ORAL_TABLET | Freq: Every day | ORAL | 10 refills | Status: DC

## 2021-05-04 MED ORDER — CYCLOBENZAPRINE HCL 10 MG PO TABS: 10.0000 mg | ORAL_TABLET | Freq: Three times a day (TID) | ORAL | 0 refills | Status: DC | PRN

## 2021-05-04 MED ORDER — APIXABAN 5 MG PO TABS: 5.0000 mg | ORAL_TABLET | Freq: Two times a day (BID) | ORAL | Status: DC

## 2021-05-04 NOTE — Anesthesia Postprocedure Evaluation (Signed)
Anesthesia Post Note  Patient: Francisco [name]  Procedure(s) Performed: Laminectomy and Foraminotomy - L2-L3 (Back)     Patient location during evaluation: PACU Anesthesia Type: General Level of consciousness: awake and alert Pain management: pain level controlled Vital Signs Assessment: post-procedure vital signs reviewed and stable Respiratory status: spontaneous breathing, nonlabored ventilation and respiratory function stable Cardiovascular status: stable and blood pressure returned to baseline Anesthetic complications: no   No notable events documented.  Last Vitals:  Vitals:   05/04/21 0406 05/04/21 0824  BP: (!) 122/55 138/61  Pulse: 67 67  Resp: 20 18  Temp: 36.7 C 36.7 C  SpO2: 95% 97%    Last Pain:  Vitals:   05/04/21 0824  TempSrc: Oral  PainSc:                  Audry Pili

## 2021-05-04 NOTE — Progress Notes (Signed)
Patient was transported via wheelchair by volunteer for discharge home; in no acute distress nor complaints of pain nor discomfort; room was checked and accounted for all his belongings; discharge instructions given to patient by RN and he verbalized understanding on the instructions given.

## 2021-05-04 NOTE — Progress Notes (Signed)
Occupational Therapy Evaluation Patient Details Name: Francisco [name] MRN: [id number] DOB: 1946/01/18 Today's Date: 05/04/2021    History of Present Illness 75 year old male post L2-3 decompressive laminectomy with foraminotomies 8/22.  COPD, OSA, dyspnea, Myasthenia gravis, hypercholesterolemia, dysrhythmia, atypical chest pain, CKD, carotid stenosis , GERD, hypothyroidism, glaucoma, prostate cancer, skin cancer, spinal surgery (back surgery 1985; C5-7 ACDF 07/18/11; L3-S1 laminectomy; left L5-S1 microdiscectomy 02/24/14; C3-6 posterior fusion 08/07/20 for myelopathy/LLEL weakness and neurogenic bladder with post-op CIRC stay). BMI is consistent with obesity.   Clinical Impression   Francisco [name] was evaluated s/p the above back surgery. PTA pt completed mobility with WC, quad cane or the help of his wife and he completed ADLs with assistance. He lives in a 1 level home with 3 STE. Upon evaluation, pt was supervision/min guard for all mobility and up to min A for ADLs. Pt verbalized great understanding of compensatory techniques for ADLs to maintain back precaution with use of AE. Pt does not required further OT acutely. Recommend pt d/c home with supervision initially for all ADLs and mobility.     Follow Up Recommendations  No OT follow up;Supervision - Intermittent    Equipment Recommendations  None recommended by OT       Precautions / Restrictions Precautions Precautions: Back;Fall Precaution Comments: pt recalled 3/3 back precautions and compensatory techniques fro ADLs Required Braces or Orthoses:  (no brace needed) Restrictions Weight Bearing Restrictions: No Other Position/Activity Restrictions: no lifting more than 10 lbs      Mobility Bed Mobility Overal bed mobility: Needs Assistance Bed Mobility: Rolling;Sidelying to Sit Rolling: Supervision Sidelying to sit: Supervision       General bed mobility comments: vc for log roll, pt demonstrated great understanding     Transfers Overall transfer level: Needs assistance Equipment used: None Transfers: Sit to/from Stand Sit to Stand: Min guard              Balance Overall balance assessment: Needs assistance Sitting-balance support: Feet supported Sitting balance-Francisco [name] Scale: Good     Standing balance support: No upper extremity supported;During functional activity Standing balance-Francisco [name] Scale: Fair             ADL either performed or assessed with clinical judgement   ADL Overall ADL's : Needs assistance/impaired Eating/Feeding: Independent;Sitting   Grooming: Supervision/safety;Cueing for compensatory techniques;Standing   Upper Body Bathing: Supervision/ safety;Sitting;Cueing for compensatory techniques   Lower Body Bathing: Minimal assistance;Adhering to back precautions;Cueing for compensatory techniques;Sit to/from stand   Upper Body Dressing : Set up;Sitting   Lower Body Dressing: Minimal assistance;Cueing for safety;Cueing for compensatory techniques;Adhering to back precautions;Sit to/from stand   Toilet Transfer: Supervision/safety;Ambulation   Toileting- Clothing Manipulation and Hygiene: Supervision/safety;Sit to/from stand       Functional mobility during ADLs: Supervision/safety;Cueing for safety       Vision   Vision Assessment?: No apparent visual deficits            Pertinent Vitals/Pain Pain Assessment: Faces Faces Pain Scale: Hurts a little bit Pain Location: back, sx site Pain Descriptors / Indicators: Grimacing;Guarding Pain Intervention(s): Monitored during session;Repositioned     Hand Dominance Right   Extremity/Trunk Assessment Upper Extremity Assessment Upper Extremity Assessment: Overall WFL for tasks assessed   Lower Extremity Assessment Lower Extremity Assessment: Defer to PT evaluation   Cervical / Trunk Assessment Cervical / Trunk Assessment: Other exceptions Cervical / Trunk Exceptions: s/p back sx   Communication  Communication Communication: No difficulties   Cognition Arousal/Alertness: Awake/alert Behavior During Therapy: [facility] for  tasks assessed/performed Overall Cognitive Status: Within Functional Limits for tasks assessed               General Comments  vss on RA, no new concerns this session     [place] expects to be discharged to:: Private residence Living Arrangements: Spouse/significant other Available Help at Discharge: Family;Available 24 hours/day Type of Home: House Home Access: Stairs to enter [employer] of Steps: 3 Entrance Stairs-Rails: Left Home Layout: One level     Bathroom Shower/Tub: Occupational psychologist: Handicapped height Bathroom Accessibility: Yes   Home Equipment: Environmental consultant - 2 wheels;Cane - quad;Shower seat;Grab bars - toilet;Grab bars - tub/shower;Adaptive equipment;Wheelchair - Higher education careers adviser: Reacher;Sock aid;Long-handled shoe horn        Prior Functioning/Environment Level of Independence: Needs assistance  Gait / Transfers Assistance Needed: Pt used wc for in home mobility, a quad cane to manage steps, and his wife assisted as needed ADL's / Homemaking Assistance Needed: wife has been assisting as needed Communication / Swallowing Assistance Needed: WFL Comments: Pt was indep prior to exacerbation of back symptoms        OT Problem List: Decreased strength;Decreased range of motion;Decreased activity tolerance;Impaired balance (sitting and/or standing);Decreased safety awareness;Decreased knowledge of use of DME or AE;Decreased knowledge of precautions;Pain         OT Goals(Current goals can be found in the care plan section) Acute Rehab OT Goals Patient Stated Goal: get back to indep OT Goal Formulation: With patient      AM-PAC OT "6 Clicks" Daily Activity     Outcome Measure Help from another person eating meals?: None Help from another person taking care of personal grooming?: A  Little Help from another person toileting, which includes using toliet, bedpan, or urinal?: A Little Help from another person bathing (including washing, rinsing, drying)?: A Little Help from another person to put on and taking off regular upper body clothing?: None Help from another person to put on and taking off regular lower body clothing?: A Little 6 Click Score: 20   End of Session Nurse Communication: Mobility status;Precautions;Weight bearing status  Activity Tolerance: Patient tolerated treatment well Patient left: in bed;with call bell/phone within reach  OT Visit Diagnosis: Unsteadiness on feet (R26.81);Repeated falls (R29.6);Other abnormalities of gait and mobility (R26.89);History of falling (Z91.81);Muscle weakness (generalized) (M62.81);Pain                Time: [id number] OT Time Calculation (min): 17 min Charges:  OT General Charges $OT Visit: 1 Visit OT Evaluation $OT Eval Low Complexity: 1 Low    Kwasi Joung A Laiza Veenstra 05/04/2021, 8:54 AM

## 2021-05-04 NOTE — Discharge Instructions (Addendum)
**  Restart aspirin and Plavix on 05/08/2021**  Wound Care Keep incision covered and dry for two days.  If you shower, cover incision with plastic wrap.  Do not put any creams, lotions, or ointments on incision. Leave steri-strips on back.  They will fall off by themselves. Activity Walk each and every day, increasing distance each day. No lifting greater than 5 lbs. No driving for 2 weeks; may ride as a passenger locally. If provided with back brace, wear when out of bed.  It is not necessary to wear brace in bed. Diet Resume your normal diet.   Call Your Doctor If Any of These Occur Redness, drainage, or swelling at the wound.  Temperature greater than 101 degrees. Severe pain not relieved by pain medication. Incision starts to come apart. Follow Up Appt Call today for appointment in 1-2 weeks [id number]) or for problems.

## 2021-05-04 NOTE — Discharge Summary (Signed)
Physician Discharge Summary     Providing Compassionate, Quality Care - Together   Patient ID: Francisco [name] MRN: [id number] DOB/AGE: [birth year] 76 y.o.  Admit date: 05/03/2021 Discharge date: 05/04/2021  Admission Diagnoses: Lumbar stenosis with neurogenic claudication  Discharge Diagnoses:  Active Problems:   Lumbar stenosis with neurogenic claudication   Discharged Condition: good  Hospital Course: Patient underwent an L2-3 decompressive laminectomy with foraminotomies by Dr. Annette Stable on 05/03/2021. He was admitted to 3C09 following recovery from anesthesia in the PACU. His postoperative course has been uncomplicated. He has worked with both physical and occupational therapies who feel the patient is ready for discharge home. He is ambulating independently and without difficulty. He is tolerating a normal diet. He is not having any bowel or bladder dysfunction. His pain is well-controlled with oral pain medication. He is ready for discharge home.   Consults: None  Significant Diagnostic Studies: radiology: DG Lumbar Spine 1 View  Result Date: 05/04/2021 CLINICAL DATA:  Localization film for L2-3 laminectomy EXAM: LUMBAR SPINE - 1 VIEW COMPARISON:  Lumbar spine MRI 04/09/2021 FINDINGS: There are 5 non rib-bearing lumbar type vertebral bodies. The hardware projects just inferior to the L2 spinous processes. Alignment is within normal limits. Multilevel degenerative changes are better evaluated on the recent lumbar spine MRI. IMPRESSION: As above. Electronically Signed   By: Valetta Mole M.D.   On: 05/04/2021 09:26     Treatments: surgery:  L2-3 decompressive laminectomy with foraminotomies  Discharge Exam: Blood pressure 138/61, pulse 67, temperature 98.1 F (36.7 C), temperature source Oral, resp. rate 18, height '5\' 10"'$  (1.778 m), weight 107 kg, SpO2 97 %.  Alert and oriented x 4 PERRLA CN II-XII grossly intact MAE, Strength and sensation intact Incision is covered with  Honeycomb dressing and Steri Strips; Dressing is clean, dry, and intact   Disposition: Discharge disposition: 01-Home or Self Care        Allergies as of 05/04/2021       Reactions   Azithromycin Other (See Comments)   Avoid due to myasthenia gravis   Codeine Nausea And Vomiting        Medication List     STOP taking these medications    aspirin 81 MG EC tablet   cephALEXin 500 MG capsule Commonly known as: KEFLEX   iron polysaccharides 150 MG capsule Commonly known as: NIFEREX   pregabalin 100 MG capsule Commonly known as: LYRICA       TAKE these medications    apixaban 5 MG Tabs tablet Commonly known as: ELIQUIS Take 1 tablet (5 mg total) by mouth 2 (two) times daily. Restart 05/08/2021 What changed: additional instructions   atorvastatin 10 MG tablet Commonly known as: LIPITOR Take 1 tablet (10 mg total) by mouth daily.   azaTHIOprine 50 MG tablet Commonly known as: IMURAN Take 150 mg by mouth daily.   clopidogrel 75 MG tablet Commonly known as: PLAVIX Take 1 tablet (75 mg total) by mouth daily at 6 (six) AM. Restart 05/08/2021 What changed: additional instructions   Combivent Respimat 20-100 MCG/ACT Aers respimat Generic drug: Ipratropium-Albuterol Inhale 1 puff into the lungs every 6 (six) hours as needed for wheezing.   cyclobenzaprine 10 MG tablet Commonly known as: FLEXERIL Take 1 tablet (10 mg total) by mouth 3 (three) times daily as needed for muscle spasms.   desmopressin 0.2 MG tablet Commonly known as: DDAVP Take 0.2 mg by mouth at bedtime.   furosemide 20 MG tablet Commonly known as: LASIX Take 40 mg  by mouth daily.   gabapentin 300 MG capsule Commonly known as: NEURONTIN Take 300 mg by mouth 2 (two) times daily.   HYDROcodone-acetaminophen 10-325 MG tablet Commonly known as: NORCO Take 1-2 tablets by mouth See admin instructions. Take 1 to 2 tablets every morning, may take 1 tablet every 6 hours as needed for pain    levothyroxine 88 MCG tablet Commonly known as: SYNTHROID Take 88 mcg by mouth daily before breakfast.   Magnesium 500 MG Tabs Take 500 mg by mouth at bedtime.   pantoprazole 40 MG tablet Commonly known as: PROTONIX Take 40 mg by mouth 2 (two) times daily before a meal.   podofilox 0.5 % external solution Commonly known as: CONDYLOX Apply 1 application topically See admin instructions. Apply to affected area Monday through Thursday   predniSONE 10 MG tablet Commonly known as: DELTASONE Take 10 mg by mouth every other day.   predniSONE 20 MG tablet Commonly known as: DELTASONE Take 30 mg by mouth every other day.   pyridostigmine 60 MG tablet Commonly known as: MESTINON Take 60 mg by mouth in the morning and at bedtime.   rOPINIRole 4 MG tablet Commonly known as: REQUIP Take 4 mg by mouth at bedtime.   traZODone 50 MG tablet Commonly known as: DESYREL Take 0.5 tablets (25 mg total) by mouth at bedtime as needed for sleep. What changed:  how much to take when to take this   vitamin B-12 1000 MCG tablet Commonly known as: CYANOCOBALAMIN Take 1,000 mcg by mouth daily.   VITAMIN D3 PO Take 1 capsule by mouth daily.        Follow-up Information     Earnie Larsson, MD. Go on 05/13/2021.   Specialty: Neurosurgery Why: First post op appointment is 05/13/2021 at 3:15 PM. Contact information: [address] [place] [address] [phone]                 Signed: Viona Gilmore, DNP, AGNP-C Nurse Practitioner  [facility] [address], [address], [place], [place] [address] P: [phone]    F: [phone]  05/04/2021, 11:20 AM

## 2021-05-04 NOTE — Evaluation (Signed)
Physical Therapy Evaluation Patient Details Name: Francisco [name] MRN: [id number] DOB: [birth year] Today's Date: 05/04/2021   History of Present Illness  Pt is a 75 y/o male who presents s/p L2-3 decompressive laminectomy with foraminotomies on 05/03/21. PMH significant for COPD, OSA, Myasthenia gravis, dysrhythmia, CKD, carotid stenosis, hypothyroidism, glaucoma, prostate cancer, skin cancer, back surgery 1985; C5-7 ACDF 07/18/11; L3-S1 laminectomy; left L5-S1 microdiscectomy 02/24/14; C3-6 posterior fusion 08/07/20.   Clinical Impression  Pt admitted with above diagnosis. At the time of PT eval, pt was able to demonstrate transfers and ambulation with gross supervision for safety and RW for support. Pt was educated on precautions, appropriate activity progression, and car transfer. Pt currently with functional limitations due to the deficits listed below (see PT Problem List). Pt will benefit from skilled PT to increase their independence and safety with mobility to allow discharge to the venue listed below.      Follow Up Recommendations No PT follow up;Supervision for mobility/OOB    Equipment Recommendations  None recommended by PT    Recommendations for Other Services       Precautions / Restrictions Precautions Precautions: Back;Fall Precaution Booklet Issued: Yes (comment) Precaution Comments: Reviewed handout and pt was cued for precautions during functional mobility. Required Braces or Orthoses:  (no brace needed) Restrictions Weight Bearing Restrictions: No      Mobility  Bed Mobility               General bed mobility comments: Pt was recevied sitting up EOB. Pt reports he will be sleeping in the recliner upon return home. Verbally reviewed log roll.    Transfers Overall transfer level: Needs assistance Equipment used: None Transfers: Sit to/from Stand Sit to Stand: Supervision         General transfer comment: No assist to power-up to full stand. No  unsteadiness or LOB noted with good hand placement on seated surface for safety.  Ambulation/Gait Ambulation/Gait assistance: Supervision Gait Distance (Feet): 500 Feet Assistive device: Rolling walker (2 wheeled) Gait Pattern/deviations: Step-through pattern;Decreased stride length;Trunk flexed Gait velocity: Decreased Gait velocity interpretation: 1.31 - 2.62 ft/sec, indicative of limited community ambulator General Gait Details: VC's for improved posture, closer walker proximity, and forward gaze. No assist required and no overt LOB noted.  Stairs Stairs:  (Pt declined stair training at this time.)          Wheelchair Mobility    Modified Rankin (Stroke Patients Only)       Balance Overall balance assessment: Needs assistance Sitting-balance support: Feet supported Sitting balance-Leahy Scale: Good     Standing balance support: No upper extremity supported;During functional activity Standing balance-Leahy Scale: Fair                               Pertinent Vitals/Pain Pain Assessment: Faces Faces Pain Scale: Hurts a little bit Pain Location: back, sx site Pain Descriptors / Indicators: Grimacing;Guarding Pain Intervention(s): Limited activity within patient's tolerance;Monitored during session;Repositioned    Home Living Family/patient expects to be discharged to:: Private residence Living Arrangements: Spouse/significant other Available Help at Discharge: Family;Available 24 hours/day Type of Home: House Home Access: Stairs to enter Entrance Stairs-Rails: Left Entrance Stairs-Number of Steps: 3 Home Layout: One level Home Equipment: Walker - 2 wheels;Cane - quad;Shower seat;Grab bars - toilet;Grab bars - tub/shower;Adaptive equipment;Wheelchair - manual      Prior Function Level of Independence: Needs assistance   Gait / Transfers Assistance Needed: Pt used  wc for in home mobility, a quad cane to manage steps, and his wife assisted as  needed  ADL's / Homemaking Assistance Needed: wife has been assisting as needed  Comments: Pt was indep prior to exacerbation of back symptoms     Hand Dominance   Dominant Hand: Right    Extremity/Trunk Assessment   Upper Extremity Assessment Upper Extremity Assessment: Defer to OT evaluation    Lower Extremity Assessment Lower Extremity Assessment: Generalized weakness    Cervical / Trunk Assessment Cervical / Trunk Assessment: Other exceptions Cervical / Trunk Exceptions: s/p surgery  Communication   Communication: No difficulties  Cognition Arousal/Alertness: Awake/alert Behavior During Therapy: WFL for tasks assessed/performed Overall Cognitive Status: Within Functional Limits for tasks assessed                                        General Comments      Exercises     Assessment/Plan    PT Assessment Patient needs continued PT services  PT Problem List Decreased strength;Decreased activity tolerance;Decreased balance;Decreased mobility;Decreased knowledge of use of DME;Decreased safety awareness;Decreased knowledge of precautions;Pain       PT Treatment Interventions DME instruction;Gait training;Functional mobility training;Therapeutic activities;Therapeutic exercise;Stair training;Neuromuscular re-education;Patient/family education    PT Goals (Current goals can be found in the Care Plan section)  Acute Rehab PT Goals Patient Stated Goal: Retire October 1 PT Goal Formulation: With patient Time For Goal Achievement: 05/11/21 Potential to Achieve Goals: Good    Frequency Min 5X/week   Barriers to discharge        Co-evaluation               AM-PAC PT "6 Clicks" Mobility  Outcome Measure Help needed turning from your back to your side while in a flat bed without using bedrails?: None Help needed moving from lying on your back to sitting on the side of a flat bed without using bedrails?: A Little Help needed moving to and  from a bed to a chair (including a wheelchair)?: A Little Help needed standing up from a chair using your arms (e.g., wheelchair or bedside chair)?: A Little Help needed to walk in hospital room?: A Little Help needed climbing 3-5 steps with a railing? : A Little 6 Click Score: 19    End of Session Equipment Utilized During Treatment: Gait belt Activity Tolerance: Patient tolerated treatment well Patient left: with call bell/phone within reach (Sitting EOB) Nurse Communication: Mobility status PT Visit Diagnosis: Unsteadiness on feet (R26.81);Pain Pain - part of body:  (back)    Time: [id number] PT Time Calculation (min) (ACUTE ONLY): 16 min   Charges:   PT Evaluation $PT Eval Low Complexity: 1 Low          Francisco [name], PT, DPT Acute Rehabilitation Services Pager: [phone] Office: [phone]   Francisco [name] 05/04/2021, 12:21 PM

## 2021-06-18 ENCOUNTER — Encounter (INDEPENDENT_AMBULATORY_CARE_PROVIDER_SITE_OTHER): Payer: Medicare Other

## 2021-06-18 ENCOUNTER — Ambulatory Visit (INDEPENDENT_AMBULATORY_CARE_PROVIDER_SITE_OTHER): Payer: Medicare Other | Admitting: Nurse Practitioner

## 2021-07-05 ENCOUNTER — Encounter (INDEPENDENT_AMBULATORY_CARE_PROVIDER_SITE_OTHER): Payer: Medicare Other

## 2021-07-05 ENCOUNTER — Ambulatory Visit (INDEPENDENT_AMBULATORY_CARE_PROVIDER_SITE_OTHER): Payer: Medicare Other | Admitting: Nurse Practitioner

## 2021-07-06 ENCOUNTER — Encounter (INDEPENDENT_AMBULATORY_CARE_PROVIDER_SITE_OTHER): Payer: Self-pay | Admitting: Nurse Practitioner

## 2021-07-06 ENCOUNTER — Other Ambulatory Visit: Payer: Self-pay

## 2021-07-06 ENCOUNTER — Ambulatory Visit (INDEPENDENT_AMBULATORY_CARE_PROVIDER_SITE_OTHER): Payer: Medicare Other | Admitting: Nurse Practitioner

## 2021-07-06 ENCOUNTER — Ambulatory Visit (INDEPENDENT_AMBULATORY_CARE_PROVIDER_SITE_OTHER): Payer: Medicare Other

## 2021-07-06 VITALS — BP 166/61 | HR 87 | Ht 70.0 in | Wt 245.0 lb

## 2021-07-06 DIAGNOSIS — I6523 Occlusion and stenosis of bilateral carotid arteries: Secondary | ICD-10-CM | POA: Diagnosis not present

## 2021-07-06 DIAGNOSIS — E7800 Pure hypercholesterolemia, unspecified: Secondary | ICD-10-CM | POA: Diagnosis not present

## 2021-07-06 DIAGNOSIS — I10 Essential (primary) hypertension: Secondary | ICD-10-CM | POA: Diagnosis not present

## 2021-07-06 NOTE — Progress Notes (Signed)
Subjective:    Patient ID: Francisco [name], male    DOB: Apr 02, 1946, 76 y.o.   MRN: [id number] Chief Complaint  Patient presents with   Follow-up    6 Mo carotid    Francisco [name] is 75 year old male that is seen for follow up evaluation of carotid stenosis. The carotid stenosis followed by ultrasound.  The patient has a previous history of a right ICA stent placed on 09/17/2020.  He recently underwent back surgery on 05/03/2021 and is continuing to recover.  The patient denies amaurosis fugax. There is no recent history of TIA symptoms or focal motor deficits. There is no prior documented CVA.  The patient is taking enteric-coated aspirin 81 mg daily.  There is no history of migraine headaches. There is no history of seizures.  The patient has a history of coronary artery disease, no recent episodes of angina or shortness of breath. The patient denies PAD or claudication symptoms. There is a history of hyperlipidemia which is being treated with a statin.    Carotid Duplex done today shows 1 to 39% stenosis of the right ICA with 40 to 59% stenosis of the left.  The patient does have some elevated velocities near the left subclavian however no evidence of subclavian steal.  Vertebral arteries demonstrate antegrade flow bilaterally.  Normal flow hemodynamics bilaterally in the subclavian arteries.  No change compared to last study in 01/01/2021   Review of Systems  Musculoskeletal:  Positive for gait problem.  Neurological:  Positive for weakness.  All other systems reviewed and are negative.     Objective:   Physical Exam Vitals reviewed.  HENT:     Head: Normocephalic.  Cardiovascular:     Rate and Rhythm: Normal rate and regular rhythm.     Pulses:          Radial pulses are 1+ on the right side and 1+ on the left side.     Heart sounds: Normal heart sounds.  Pulmonary:     Effort: Pulmonary effort is normal.     Breath sounds: Normal breath sounds.  Neurological:      Mental Status: He is alert and oriented to person, place, and time.     Gait: Gait abnormal.  Psychiatric:        Mood and Affect: Mood normal.        Behavior: Behavior normal.        Thought Content: Thought content normal.        Judgment: Judgment normal.    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   DDD (degenerative disc disease), cervical    Dysrhythmia    post carotid stent bradycardia; PAF 09/2020   GERD (gastroesophageal reflux disease)    Hypercholesterolemia    Hypertension    Hypothyroidism  pt takes Levothyroxine daily   Lumbosacral spinal stenosis    Myasthenia gravis, adult form (HCC)    PAD (peripheral artery disease) (HCC)    Shortness of breath    Lung MD- Dr Darlin Coco   Sleep apnea    do not use CPAP every night    Social History   Socioeconomic History   Marital status: Married    Spouse name: [name]   Number of children: Not on file   Years of education: Not on file   Highest education level: Not on file  Occupational History   Not on file  Tobacco Use   Smoking status: Former    Packs/day: 1.00    Years: 20.00    Pack years: 20.00    Types: Cigarettes    Quit date: 09/12/2001    Years since quitting: 19.8   Smokeless tobacco: Never  Vaping Use   Vaping Use: Never used  Substance and Sexual Activity   Alcohol use: Yes    Alcohol/week: 3.0 standard drinks    Types: 3 Glasses of wine per week    Comment: 3 glasses a wine  a week   Drug use: No   Sexual activity: Yes  Other Topics Concern   Not on file  Social History Narrative   Not on file   Social Determinants of Health   Financial Resource Strain: Not on file  Food Insecurity: Not on file  Transportation Needs: Not on file  Physical Activity: Not on file  Stress: Not on file  Social Connections: Not on file  Intimate Partner Violence: Not on file    Past Surgical History:  Procedure Laterality Date   ANTERIOR CERVICAL DECOMP/DISCECTOMY FUSION  07/18/2011   Procedure: ANTERIOR CERVICAL DECOMPRESSION/DISCECTOMY FUSION 2 LEVELS;  Surgeon: Cooper Render Pool;  Location: [facility];  Service: Neurosurgery;  Laterality: N/A;  cervical five-six, cervical six-seven anterior cervical discectomy and fusion   BACK SURGERY     in [employer]     01/2020 Right, 04/2020 Left   CARDIAC CATHETERIZATION     2005 at [facility], no stents   CAROTID PTA/STENT INTERVENTION N/A 09/17/2020   Procedure: CAROTID PTA/STENT INTERVENTION;  Surgeon: Algernon Huxley, MD;  Location: [facility];  Service: Cardiovascular;  Laterality: N/A;   CATARACT EXTRACTION W/PHACO Left 01/06/2020   Procedure: CATARACT EXTRACTION PHACO AND INTRAOCULAR LENS PLACEMENT ([facility]) ISTENT INJ LEFT 3.81  00:33.3;  Surgeon: Eulogio Bear, MD;  Location: [facility];  Service: Ophthalmology;  Laterality: Left;   CATARACT EXTRACTION W/PHACO Right 02/03/2020   Procedure: CATARACT EXTRACTION PHACO AND INTRAOCULAR LENS PLACEMENT ([place]) RIGHT ISTENT INJ;  Surgeon: Eulogio Bear, MD;  Location: [facility];  Service: Ophthalmology;  Laterality: Right;  4.29 0:35.6   COLONOSCOPY     HERNIA REPAIR Left    inguinal hernia repair in 1985   LUMBAR LAMINECTOMY/DECOMPRESSION MICRODISCECTOMY Left 02/24/2014   Procedure: LUMBAR LAMINECTOMY/DECOMPRESSION MICRODISCECTOMY LUMBAR THREE-FOUR, FOUR-FIVE, LEFT FIVE-SACRAL ONE ;  Surgeon: Charlie Pitter, MD;  Location:  [facility];  Service: Neurosurgery;  Laterality: Left;  LUMBAR LAMINECTOMY/DECOMPRESSION MICRODISCECTOMY LUMBAR THREE-FOUR, FOUR-FIVE, LEFT FIVE-SACRAL ONE    LUMBAR LAMINECTOMY/DECOMPRESSION MICRODISCECTOMY N/A 05/03/2021   Procedure: Laminectomy and Foraminotomy - L2-L3;  Surgeon: Earnie Larsson, MD;  Location: [facility];  Service: Neurosurgery;  Laterality: N/A;  3C   POSTERIOR CERVICAL FUSION/FORAMINOTOMY N/A 08/07/2020   Procedure: C3-6 POSTERIOR FUSION WITH DECOMPRESSION;  Surgeon: Meade Maw, MD;  Location: [facility];  Service: Neurosurgery;  Laterality: N/A;   PROSTATECTOMY  8/14   [facility] Dr Mare Ferrari     Family History  Problem Relation Age of Onset   Hypertension Mother    Stroke Mother    Stroke Father     Allergies  Allergen Reactions   Azithromycin Other (See Comments)    Avoid due to myasthenia gravis   Codeine Nausea And Vomiting    CBC Latest Ref Rng & Units 04/30/2021 10/28/2020 09/19/2020  WBC 4.0 - 10.5 K/uL 7.0 7.2 5.9  Hemoglobin 13.0 - 17.0 g/dL 10.7(L) 9.8(L) 8.4(L)  Hematocrit 39.0 - 52.0 % 34.6(L) 30.7(L) 27.9(L)  Platelets 150 - 400 K/uL 234 304 229      CMP     Component Value Date/Time   NA 140 04/30/2021 1040   NA 141 03/04/2014 0450   K 4.2 04/30/2021 1040   K 3.9 03/04/2014 0450   CL 100 04/30/2021 1040   CL 104 03/04/2014 0450   CO2 32 04/30/2021 1040   CO2 28 03/04/2014 0450   GLUCOSE 104 (H) 04/30/2021 1040   GLUCOSE 100 (H) 03/04/2014 0450   BUN 27 (H) 04/30/2021 1040   BUN 25 (H) 03/04/2014 0450   CREATININE 1.23 04/30/2021 1040   CREATININE 0.99 03/04/2014 0450   CALCIUM 8.6 (L) 04/30/2021 1040   CALCIUM 9.0 03/04/2014 0450   PROT 6.5 08/17/2020 1029   ALBUMIN 3.1 (L) 08/17/2020 1029   AST 15 08/17/2020 1029   ALT 18 08/17/2020 1029   ALKPHOS 48 08/17/2020 1029   BILITOT 0.5 08/17/2020 1029   GFRNONAA >60 04/30/2021 1040   GFRNONAA >60 03/04/2014 0450   GFRAA >60 03/04/2014 0450     No results found.     Assessment  & Plan:   1. Bilateral carotid artery stenosis Duplex today shows his right carotid stent to be widely patent with 40 to 59% left ICA stenosis.  Doing well.  Continue current medical regimen.  Recheck in 12 months.  2. Essential hypertension Continue antihypertensive medications as already ordered, these medications have been reviewed and there are no changes at this time.   3. Pure hypercholesterolemia Continue statin as ordered and reviewed, no changes at this time    Current Outpatient Medications on File Prior to Visit  Medication Sig Dispense Refill   apixaban (ELIQUIS) 5 MG TABS tablet Take 1 tablet (5 mg total) by mouth 2 (two) times daily. Restart 05/08/2021 60 tablet    atorvastatin (LIPITOR) 10 MG tablet Take 1 tablet (10 mg total) by mouth daily. 30 tablet 10   azaTHIOprine (IMURAN) 50 MG tablet Take 150 mg by mouth daily.      Cholecalciferol (VITAMIN D3 PO) Take 1 capsule by mouth daily.     clopidogrel (PLAVIX) 75 MG tablet Take 1 tablet (75 mg total) by mouth daily at 6 (six) AM. Restart 05/08/2021 30 tablet 10   cyclobenzaprine (FLEXERIL) 10 MG tablet Take 1 tablet (10 mg total) by mouth 3 (three) times daily as needed for muscle spasms. 30 tablet 0   desmopressin (DDAVP) 0.2 MG tablet Take 0.2 mg by mouth at bedtime.     furosemide (LASIX) 20 MG tablet Take 40 mg by mouth daily.     gabapentin (NEURONTIN) 300 MG capsule Take 300 mg by mouth 2 (two) times daily.     HYDROcodone-acetaminophen (NORCO) 10-325 MG tablet Take 1-2 tablets by mouth See admin instructions. Take 1 to 2 tablets every morning, may take 1 tablet  every 6 hours as needed for pain     Ipratropium-Albuterol (COMBIVENT RESPIMAT) 20-100 MCG/ACT AERS respimat Inhale 1 puff into the lungs every 6 (six) hours as needed for wheezing.     levothyroxine (SYNTHROID, LEVOTHROID) 88 MCG tablet Take 88 mcg by mouth daily before breakfast.     Magnesium 500 MG TABS Take 500 mg by mouth at bedtime.     pantoprazole  (PROTONIX) 40 MG tablet Take 40 mg by mouth 2 (two) times daily before a meal.      pantoprazole (PROTONIX) 40 MG tablet Take 1 tablet by mouth 2 (two) times daily.     podofilox (CONDYLOX) 0.5 % external solution Apply 1 application topically See admin instructions. Apply to affected area Monday through Thursday     predniSONE (DELTASONE) 10 MG tablet Take 10 mg by mouth every other day.     predniSONE (DELTASONE) 20 MG tablet Take 30 mg by mouth every other day.     pyridostigmine (MESTINON) 60 MG tablet Take 60 mg by mouth in the morning and at bedtime.      rOPINIRole (REQUIP) 4 MG tablet Take 4 mg by mouth at bedtime.      traZODone (DESYREL) 50 MG tablet Take 0.5 tablets (25 mg total) by mouth at bedtime as needed for sleep. (Patient taking differently: Take 50 mg by mouth at bedtime.) 15 tablet 0   vitamin B-12 (CYANOCOBALAMIN) 1000 MCG tablet Take 1,000 mcg by mouth daily.     No current facility-administered medications on file prior to visit.    There are no Patient Instructions on file for this visit. No follow-ups on file.   Kris Hartmann, NP

## 2021-08-06 ENCOUNTER — Other Ambulatory Visit: Payer: Self-pay | Admitting: Surgery

## 2021-08-12 LAB — COLOGUARD: COLOGUARD: NEGATIVE

## 2021-08-26 ENCOUNTER — Other Ambulatory Visit (HOSPITAL_COMMUNITY): Payer: Self-pay | Admitting: Urology

## 2021-08-26 ENCOUNTER — Other Ambulatory Visit: Payer: Self-pay | Admitting: Urology

## 2021-08-26 DIAGNOSIS — C61 Malignant neoplasm of prostate: Secondary | ICD-10-CM

## 2021-08-26 DIAGNOSIS — R319 Hematuria, unspecified: Secondary | ICD-10-CM

## 2021-08-28 ENCOUNTER — Encounter: Payer: Self-pay | Admitting: Emergency Medicine

## 2021-08-28 ENCOUNTER — Emergency Department
Admission: EM | Admit: 2021-08-28 | Discharge: 2021-08-28 | Disposition: A | Discharge status: Home / Self Care | Payer: Medicare Other | Attending: Emergency Medicine | Admitting: Emergency Medicine

## 2021-08-28 ENCOUNTER — Other Ambulatory Visit: Payer: Self-pay

## 2021-08-28 ENCOUNTER — Emergency Department: Payer: Medicare Other

## 2021-08-28 DIAGNOSIS — Z87891 Personal history of nicotine dependence: Secondary | ICD-10-CM | POA: Insufficient documentation

## 2021-08-28 DIAGNOSIS — Z8546 Personal history of malignant neoplasm of prostate: Secondary | ICD-10-CM | POA: Insufficient documentation

## 2021-08-28 DIAGNOSIS — Z7901 Long term (current) use of anticoagulants: Secondary | ICD-10-CM | POA: Insufficient documentation

## 2021-08-28 DIAGNOSIS — Z20822 Contact with and (suspected) exposure to covid-19: Secondary | ICD-10-CM | POA: Diagnosis not present

## 2021-08-28 DIAGNOSIS — N183 Chronic kidney disease, stage 3 unspecified: Secondary | ICD-10-CM | POA: Insufficient documentation

## 2021-08-28 DIAGNOSIS — I509 Heart failure, unspecified: Secondary | ICD-10-CM | POA: Insufficient documentation

## 2021-08-28 DIAGNOSIS — I130 Hypertensive heart and chronic kidney disease with heart failure and stage 1 through stage 4 chronic kidney disease, or unspecified chronic kidney disease: Secondary | ICD-10-CM | POA: Insufficient documentation

## 2021-08-28 DIAGNOSIS — Z79899 Other long term (current) drug therapy: Secondary | ICD-10-CM | POA: Insufficient documentation

## 2021-08-28 DIAGNOSIS — J449 Chronic obstructive pulmonary disease, unspecified: Secondary | ICD-10-CM | POA: Insufficient documentation

## 2021-08-28 DIAGNOSIS — R0602 Shortness of breath: Secondary | ICD-10-CM | POA: Diagnosis present

## 2021-08-28 DIAGNOSIS — E039 Hypothyroidism, unspecified: Secondary | ICD-10-CM | POA: Insufficient documentation

## 2021-08-28 LAB — CBC WITH DIFFERENTIAL/PLATELET
Abs Immature Granulocytes: 0.04 10*3/uL (ref 0.00–0.07)
Basophils Absolute: 0 10*3/uL (ref 0.0–0.1)
Basophils Relative: 0 %
Eosinophils Absolute: 0.1 10*3/uL (ref 0.0–0.5)
Eosinophils Relative: 2 %
HCT: 33.3 % — ABNORMAL LOW (ref 39.0–52.0)
Hemoglobin: 10.1 g/dL — ABNORMAL LOW (ref 13.0–17.0)
Immature Granulocytes: 1 %
Lymphocytes Relative: 15 %
Lymphs Abs: 0.8 10*3/uL (ref 0.7–4.0)
MCH: 33.4 pg (ref 26.0–34.0)
MCHC: 30.3 g/dL (ref 30.0–36.0)
MCV: 110.3 fL — ABNORMAL HIGH (ref 80.0–100.0)
Monocytes Absolute: 0.7 10*3/uL (ref 0.1–1.0)
Monocytes Relative: 13 %
Neutro Abs: 4 10*3/uL (ref 1.7–7.7)
Neutrophils Relative %: 69 %
Platelets: 293 10*3/uL (ref 150–400)
RBC: 3.02 MIL/uL — ABNORMAL LOW (ref 4.22–5.81)
RDW: 14 % (ref 11.5–15.5)
Smear Review: NORMAL
WBC: 5.7 10*3/uL (ref 4.0–10.5)
nRBC: 0.5 % — ABNORMAL HIGH (ref 0.0–0.2)

## 2021-08-28 LAB — BASIC METABOLIC PANEL
Anion gap: 6 (ref 5–15)
BUN: 25 mg/dL — ABNORMAL HIGH (ref 8–23)
CO2: 37 mmol/L — ABNORMAL HIGH (ref 22–32)
Calcium: 8.8 mg/dL — ABNORMAL LOW (ref 8.9–10.3)
Chloride: 96 mmol/L — ABNORMAL LOW (ref 98–111)
Creatinine, Ser: 1.09 mg/dL (ref 0.61–1.24)
GFR, Estimated: >60 mL/min (ref >60)
Glucose, Bld: 97 mg/dL (ref 70–99)
Potassium: 3.6 mmol/L (ref 3.5–5.1)
Sodium: 139 mmol/L (ref 135–145)

## 2021-08-28 LAB — TROPONIN I (HIGH SENSITIVITY)
Troponin I (High Sensitivity): 19 ng/L — ABNORMAL HIGH (ref <18)
Troponin I (High Sensitivity): 19 ng/L — ABNORMAL HIGH (ref <18)

## 2021-08-28 LAB — RESP PANEL BY RT-PCR (FLU A&B, COVID) ARPGX2
Influenza A by PCR: NEGATIVE
Influenza B by PCR: NEGATIVE
SARS Coronavirus 2 by RT PCR: NEGATIVE

## 2021-08-28 LAB — BRAIN NATRIURETIC PEPTIDE: B Natriuretic Peptide: 261.7 pg/mL — ABNORMAL HIGH (ref 0.0–100.0)

## 2021-08-28 MED ORDER — FUROSEMIDE 10 MG/ML IJ SOLN
40.0000 mg | Freq: Once | INTRAMUSCULAR | Status: AC
  Administered 2021-08-28: 40 mg via INTRAVENOUS
  Filled 2021-08-28: qty 4

## 2021-08-28 NOTE — ED Provider Notes (Signed)
°  Physical Exam  BP (!) 156/81    Pulse 80    Temp 97.7 F (36.5 C) (Oral)    Resp 18    Ht 5\' 10"  (1.778 m)    Wt 112.5 kg    SpO2 96%    BMI 35.58 kg/m   Physical Exam  ED Course/Procedures     Procedures  MDM   Assumed patient care for Loraine Leriche, MD.  After furosemide injection, patient produced approximately 1000 mL of urine.  He was satting at 96% on room air with ambulation and felt significantly improved.  Patient requested discharge stating that he felt reassured about outpatient follow-up with CHF clinic.  Patient was cautioned that should his shortness of breath worsen at home, he should return to the emergency department for admission.  Patient feels comfortable and has easy access to the emergency department at this time.       Vallarie Mare [place], PA-C 08/28/21 2110    Nena Polio, MD 08/29/21 [phone]

## 2021-08-28 NOTE — Discharge Instructions (Addendum)
take Lasix 40 mg or 2 pills once in the morning at 8 AM and then once at 2 PM for the next 3 days.  You should follow-up with the heart failure clinic.  Return to the ER if develop worsening shortness of breath or any other concerns

## 2021-08-28 NOTE — ED Triage Notes (Signed)
Pt via POV from home. Pt c/o SOB since yesterday. Denies cough/fever. Pt states that his pulsox read 85% at home. Pt has a hx of COPD. Denies pain.

## 2021-08-28 NOTE — ED Provider Notes (Signed)
°  Emergency Medicine Provider Triage Evaluation Note  GLENWOOD REVOIR , a 75 y.o.male,  was evaluated in triage.  Pt complains of shortness of breath.  Patient states that he has been experiencing shortness of breath for the past few weeks that has progressively gotten worse.  He states that yesterday he noticed that his oxygen level was 85%.  It is normally above 90%.  Denies fever/chills, cough, chest pain, abdominal pain, urinary symptoms.   Review of Systems  Positive: Shortness of breath Negative: Denies fever, chest pain, vomiting  Physical Exam   Vitals:   08/28/21 1231  BP: 111/73  Pulse: 71  Resp: 20  Temp: 97.7 F (36.5 C)  SpO2: (!) 88%   Gen:   Awake, no distress   Resp:  Normal effort  MSK:   Moves extremities without difficulty  Other:    Medical Decision Making  Given the patient's initial medical screening exam, the following diagnostic evaluation has been ordered. The patient will be placed in the appropriate treatment space, once one is available, to complete the evaluation and treatment. I have discussed the plan of care with the patient and I have advised the patient that an ED physician or mid-level practitioner will reevaluate their condition after the test results have been received, as the results may give them additional insight into the type of treatment they may need.    Diagnostics: Labs, EKG, CXR, respiratory panel.  Treatments: none immediately   Teodoro Spray, [place] 08/28/21 1236    Vanessa , MD 08/28/21 1335

## 2021-08-28 NOTE — ED Provider Notes (Signed)
[facility] Emergency Department Provider Note  ____________________________________________   Event Date/Time   First MD Initiated Contact with Patient 08/28/21 1826     (approximate)  I have reviewed the triage vital signs and the nursing notes.   HISTORY  Chief Complaint Shortness of Breath    HPI AUTHER [name] is a 75 y.o. male with hypertension, A. fib on Eliquis, COPD on 2 L at nighttime, myasthenia gravis well-controlled who comes in with shortness of breath.  Patient reports having swelling noted in his bilateral legs.  He takes 20 mg of Lasix daily but is been taking 2 pills in the morning.  He reports having some worsening shortness of breath has been progressively getting worse more with exertion better at rest and he states that he noted his oxygen level was low yesterday so he wanted to come in and get it checked out.  He denies any chest pain, abdominal pain.  He reports being compliant with his Eliquis.  He states that he was recently seen by neurology and has been well controlled with his myasthenia gravis and seen 2 weeks ago and noted to be doing well from that standpoint.      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   Carotid stenosis    CKD (chronic kidney disease)    CKD (chronic kidney disease) stage 3, GFR 30-59 ml/min (HCC)    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   DDD (degenerative disc disease), cervical    Dysrhythmia    post carotid stent bradycardia; PAF 09/2020   GERD (gastroesophageal reflux disease)    Hypercholesterolemia    Hypertension    Hypothyroidism    pt takes Levothyroxine daily   Lumbosacral spinal stenosis    Myasthenia gravis, adult  form ([place])    PAD (peripheral artery disease) (HCC)    Shortness of breath    Lung MD- Dr Darlin Coco   Sleep apnea    do not use CPAP every night    Patient Active Problem List   Diagnosis Date Noted   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   Acquired thrombophilia ([place]) 01/13/2021   History of decompression of median nerve 01/01/2021   Paroxysmal atrial fibrillation ([place]) 10/06/2020   Carotid stenosis, right 09/17/2020   S/P cervical spinal fusion    Leukocytosis    Essential hypertension    Anemia of chronic disease    Postoperative pain    Neuropathic pain    Cervical myelopathy ([place]) 08/07/2020   Preop cardiovascular exam 07/17/2020   SOB (shortness of breath) on exertion 07/17/2020   Leg weakness, bilateral 07/13/2020   Aortic atherosclerosis ([place]) 07/09/2020   Body mass index (BMI) 34.0-34.9, adult 12/25/2019   Myalgia 10/30/2019   Lumbar post-laminectomy syndrome 10/24/2019   PAD (peripheral artery disease) ([place]) 06/06/2019   CKD (chronic kidney disease) stage 3, GFR 30-59 ml/min ([place]) 04/29/2019   B12 deficiency 01/23/2019   Left arm numbness 02/28/2018   Neck pain 02/28/2018   Anemia 02/02/2017   DDD (degenerative disc disease), cervical 02/02/2017   Hypothyroid 02/02/2017   MRSA (methicillin resistant staph aureus) culture positive 02/02/2017   Nocturnal  hypoxia 02/02/2017   Senile purpura ([place]) 10/03/2016   Essential hypertension, benign 09/16/2016   Bilateral carotid artery disease ([place]) 09/16/2016   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/18/2016   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   History of prostate cancer 12/21/2015   Left carpal tunnel syndrome 11/11/2015   Kidney stone on left side 07/05/2015   Myasthenia gravis ([place]) 05/26/2015   Radiculitis 01/05/2015   Long-term use of high-risk medication 10/15/2014   Persistent cough 09/10/2014   Pure hypercholesterolemia 07/25/2014   Spinal stenosis, lumbar region, with neurogenic  claudication 02/24/2014   Lumbosacral stenosis with neurogenic claudication ([place]) 02/24/2014   COPD (chronic obstructive pulmonary disease) ([place]) 01/22/2014    Past Surgical History:  Procedure Laterality Date   ANTERIOR CERVICAL DECOMP/DISCECTOMY FUSION  07/18/2011   Procedure: ANTERIOR CERVICAL DECOMPRESSION/DISCECTOMY FUSION 2 LEVELS;  Surgeon: Cooper Render Pool;  Location: [place] [facility];  Service: Neurosurgery;  Laterality: N/A;  cervical five-six, cervical six-seven anterior cervical discectomy and fusion   BACK SURGERY     in [place]     01/2020 Right, 04/2020 Left   CARDIAC CATHETERIZATION     2005 at [facility], no stents   CAROTID PTA/STENT INTERVENTION N/A 09/17/2020   Procedure: CAROTID PTA/STENT INTERVENTION;  Surgeon: Algernon Huxley, MD;  Location: [place] CV LAB;  Service: Cardiovascular;  Laterality: N/A;   CATARACT EXTRACTION W/PHACO Left 01/06/2020   Procedure: CATARACT EXTRACTION PHACO AND INTRAOCULAR LENS PLACEMENT (IOC) ISTENT INJ LEFT 3.81  00:33.3;  Surgeon: Eulogio Bear, MD;  Location: [place];  Service: Ophthalmology;  Laterality: Left;   CATARACT EXTRACTION W/PHACO Right 02/03/2020   Procedure: CATARACT EXTRACTION PHACO AND INTRAOCULAR LENS PLACEMENT ([place]) RIGHT ISTENT INJ;  Surgeon: Eulogio Bear, MD;  Location: [place];  Service: Ophthalmology;  Laterality: Right;  4.29 0:35.6   COLONOSCOPY     HERNIA REPAIR Left    inguinal hernia repair in 1985   LUMBAR LAMINECTOMY/DECOMPRESSION MICRODISCECTOMY Left 02/24/2014   Procedure: LUMBAR LAMINECTOMY/DECOMPRESSION MICRODISCECTOMY LUMBAR THREE-FOUR, FOUR-FIVE, LEFT FIVE-SACRAL ONE ;  Surgeon: Charlie Pitter, MD;  Location: [place] [facility];  Service: Neurosurgery;  Laterality: Left;  LUMBAR LAMINECTOMY/DECOMPRESSION MICRODISCECTOMY LUMBAR THREE-FOUR, FOUR-FIVE, LEFT FIVE-SACRAL ONE    LUMBAR LAMINECTOMY/DECOMPRESSION MICRODISCECTOMY N/A 05/03/2021   Procedure:  Laminectomy and Foraminotomy - L2-L3;  Surgeon: Earnie Larsson, MD;  Location: [place];  Service: Neurosurgery;  Laterality: N/A;  3C   POSTERIOR CERVICAL FUSION/FORAMINOTOMY N/A 08/07/2020   Procedure: C3-6 POSTERIOR FUSION WITH DECOMPRESSION;  Surgeon: Meade Maw, MD;  Location: [facility] ORS;  Service: Neurosurgery;  Laterality: N/A;   PROSTATECTOMY  8/14   [facility] Dr Mare Ferrari     Prior to Admission medications   Medication Sig Start Date End Date Taking? Authorizing Provider  apixaban (ELIQUIS) 5 MG TABS tablet Take 1 tablet (5 mg total) by mouth 2 (two) times daily. Restart 05/08/2021 05/04/21   Viona Gilmore D, NP  atorvastatin (LIPITOR) 10 MG tablet TAKE 1 TABLET(10 MG) BY MOUTH DAILY 08/09/21   Serafina Mitchell, MD  azaTHIOprine (IMURAN) 50 MG tablet Take 150 mg by mouth daily.     [provider]  Cholecalciferol (VITAMIN D3 PO) Take 1 capsule by mouth daily.    [provider]  clopidogrel (PLAVIX) 75 MG tablet Take 1 tablet (75 mg total) by mouth daily at 6 (six) AM. Restart 05/08/2021 05/04/21   Viona Gilmore D, NP  cyclobenzaprine (FLEXERIL) 10 MG tablet Take 1  tablet (10 mg total) by mouth 3 (three) times daily as needed for muscle spasms. 05/04/21   Viona Gilmore D, NP  desmopressin (DDAVP) 0.2 MG tablet Take 0.2 mg by mouth at bedtime.    [provider]  furosemide (LASIX) 20 MG tablet Take 40 mg by mouth daily. 02/18/19   [provider]  gabapentin (NEURONTIN) 300 MG capsule Take 300 mg by mouth 2 (two) times daily.    [provider]  HYDROcodone-acetaminophen (NORCO) 10-325 MG tablet Take 1-2 tablets by mouth See admin instructions. Take 1 to 2 tablets every morning, may take 1 tablet every 6 hours as needed for pain    [provider]  Ipratropium-Albuterol (COMBIVENT RESPIMAT) 20-100 MCG/ACT AERS respimat Inhale 1 puff into the lungs every 6 (six) hours as needed for wheezing.    [provider]  levothyroxine  (SYNTHROID, LEVOTHROID) 88 MCG tablet Take 88 mcg by mouth daily before breakfast.    [provider]  Magnesium 500 MG TABS Take 500 mg by mouth at bedtime.    [provider]  pantoprazole (PROTONIX) 40 MG tablet Take 40 mg by mouth 2 (two) times daily before a meal.     [provider]  pantoprazole (PROTONIX) 40 MG tablet Take 1 tablet by mouth 2 (two) times daily. 05/10/21   [provider]  podofilox (CONDYLOX) 0.5 % external solution Apply 1 application topically See admin instructions. Apply to affected area Monday through Thursday 02/25/21   [provider]  predniSONE (DELTASONE) 10 MG tablet Take 10 mg by mouth every other day.    [provider]  predniSONE (DELTASONE) 20 MG tablet Take 30 mg by mouth every other day.    [provider]  pyridostigmine (MESTINON) 60 MG tablet Take 60 mg by mouth in the morning and at bedtime.     [provider]  rOPINIRole (REQUIP) 4 MG tablet Take 4 mg by mouth at bedtime.     [provider]  traZODone (DESYREL) 50 MG tablet Take 0.5 tablets (25 mg total) by mouth at bedtime as needed for sleep. Patient taking differently: Take 50 mg by mouth at bedtime. 08/20/20   Love, Ivan Anchors, PA-C  vitamin B-12 (CYANOCOBALAMIN) 1000 MCG tablet Take 1,000 mcg by mouth daily.    [provider]    Allergies Azithromycin and Codeine  Family History  Problem Relation Age of Onset   Hypertension Mother    Stroke Mother    Stroke Father     Social History Social History   Tobacco Use   Smoking status: Former    Packs/day: 1.00    Years: 20.00    Pack years: 20.00    Types: Cigarettes    Quit date: 09/12/2001    Years since quitting: 19.9   Smokeless tobacco: Never  Vaping Use   Vaping Use: Never used  Substance Use Topics   Alcohol use: Yes    Alcohol/week: 3.0 standard drinks    Types: 3 Glasses of wine per week    Comment: 3 glasses a wine a week   Drug use:  No      Review of Systems Constitutional: No fever/chills Eyes: No visual changes. ENT: No sore throat. Cardiovascular: No chest pain Respiratory: Positive for SOB Gastrointestinal: No abdominal pain.  No nausea, no vomiting.  No diarrhea.  No constipation. Genitourinary: Negative for dysuria. Musculoskeletal: Negative for back pain.  Positive leg swelling Skin: Negative for rash. Neurological: Negative for headaches, focal  weakness or numbness. All other ROS negative ____________________________________________   PHYSICAL EXAM:  VITAL SIGNS: ED Triage Vitals [08/28/21 1231]  Enc Vitals Group     BP 111/73     Pulse Rate 71     Resp 20     Temp 97.7 F (36.5 C)     Temp Source Oral     SpO2 (!) 88 %     Weight 248 lb (112.5 kg)     Height 5\' 10"  (1.778 m)     Head Circumference      Peak Flow      Pain Score 0     Pain Loc      Pain Edu?      Excl. in [place]?     Constitutional: Alert and oriented. Well appearing and in no acute distress. Eyes: Conjunctivae are normal. EOMI. Head: Atraumatic. Nose: No congestion/rhinnorhea. Mouth/Throat: Mucous membranes are moist.   Neck: No stridor. Trachea Midline. FROM Cardiovascular: Normal rate, regular rhythm. Grossly normal heart sounds.  Good peripheral circulation. Respiratory: Clear lungs, no increased work of breathing Gastrointestinal: Soft and nontender. No distention. No abdominal bruits.  Musculoskeletal: 1+ edema bilaterally no joint effusions. Neurologic:  Normal speech and language. No gross focal neurologic deficits are appreciated.  Skin:  Skin is warm, dry and intact. No rash noted. Psychiatric: Mood and affect are normal. Speech and behavior are normal. GU: Deferred   ____________________________________________   LABS (all labs ordered are listed, but only abnormal results are displayed)  Labs Reviewed  BASIC METABOLIC PANEL - Abnormal; Notable for the following components:      Result Value    Chloride 96 (*)    CO2 37 (*)    BUN 25 (*)    Calcium 8.8 (*)    All other components within normal limits  CBC WITH DIFFERENTIAL/PLATELET - Abnormal; Notable for the following components:   RBC 3.02 (*)    Hemoglobin 10.1 (*)    HCT 33.3 (*)    MCV 110.3 (*)    nRBC 0.5 (*)    All other components within normal limits  RESP PANEL BY RT-PCR (FLU A&B, COVID) ARPGX2  BRAIN NATRIURETIC PEPTIDE  TROPONIN I (HIGH SENSITIVITY)   ____________________________________________   ED ECG REPORT I, Vanessa Rogers, the attending physician, personally viewed and interpreted this ECG.  Normal sinus rate of 75, no ST elevation, no T wave inversions, normal intervals ____________________________________________  RADIOLOGY Robert Bellow, personally viewed and evaluated these images (plain radiographs) as part of my medical decision making, as well as reviewing the written report by the radiologist.  ED MD interpretation: Mild cardiomegaly without any pneumonia  Official radiology report(s): DG Chest 2 View  Result Date: 08/28/2021 CLINICAL DATA:  Shortness of breath EXAM: CHEST - 2 VIEW COMPARISON:  Chest x-ray 02/20/2014, CT chest 03/02/2014 FINDINGS: Heart is mildly enlarged. Mediastinum appears stable. Mild calcified plaques in the aortic arch. Elevated right hemidiaphragm. Linear opacities at the bilateral lung bases left-greater-than-right, likely represent scarring and subsegmental atelectasis. No pleural effusion or pneumothorax visualized. IMPRESSION: 1. Mild cardiomegaly. 2. Linear opacities at the lung bases which likely represents scarring and subsegmental atelectasis. Electronically Signed   By: Ofilia Neas M.D.   On: 08/28/2021 13:27    ____________________________________________   PROCEDURES  Procedure(s) performed (including Critical Care):  Procedures   ____________________________________________   INITIAL IMPRESSION / ASSESSMENT AND PLAN / ED  COURSE   REAGEN HABERMAN was evaluated in Emergency Department on 08/28/2021 for  the symptoms described in the history of present illness. He was evaluated in the context of the global COVID-19 pandemic, which necessitated consideration that the patient might be at risk for infection with the SARS-CoV-2 virus that causes COVID-19. Institutional protocols and algorithms that pertain to the evaluation of patients at risk for COVID-19 are in a state of rapid change based on information released by regulatory bodies including the CDC and federal and state organizations. These policies and algorithms were followed during the patient's care in the ED.     Pt presents with SOB.  Given his patient's exam I suspect this is most likely CHF.  At this time I do not hear any wheezing to suggest a COPD exacerbation.  I also do not feel that this is related to myasthenia gravis but will have respiratory come by to get a NIF...  differential includes: PNA-will get xray to evaluation Anemia-CBC to evaluate ACS- will get trops Arrhythmia-Will get EKG and keep on monitor.  COVID- will get testing per algorithm. PE-lower suspicion given no risk factors and other cause more likely given patient is on Eliquis and denies missing any doses  Discussed with patient we will give 40 of IV Lasix and then ambulate patient after some urine output to see how he is feeling.  If his oxygen levels are dipping low or he feels short of breath will admit to the hospital for CHF exacerbation.  If his oxygen levels are staying looking good he we can refer him to the heart failure clinic and have him take Lasix 40 mg once in the morning and once in the afternoon.  Patient expressed understanding felt comfortable with this plan.  Patient be handed off to oncoming team  pending NIF/IV lasix.                ____________________________________________   FINAL CLINICAL IMPRESSION(S) / ED DIAGNOSES   Final diagnoses:  Acute  on chronic congestive heart failure, unspecified heart failure type ([place])     MEDICATIONS GIVEN DURING THIS VISIT:  Medications  furosemide (LASIX) injection 40 mg (has no administration in time range)     ED Discharge Orders     None        Note:  This document was prepared using Dragon voice recognition software and may include unintentional dictation errors.   Vanessa Clayton, MD 08/28/21 [phone]

## 2021-08-28 NOTE — ED Notes (Signed)
Patient was 89% on room air after ambulating approximately 6 fit from wheelchair to stretcher without the O2. Patient pusle ox is 96% on 2L O2.

## 2021-08-28 NOTE — Progress Notes (Signed)
NIF completed per MD order NIF -40

## 2021-08-28 NOTE — ED Notes (Signed)
Ambulatory O2 on patient. Pt at resting started with 90% on room air. Ambulating approximately 50 feet. Pt O2 improved to 94% while ambulating and peaked at 97% upon return to stretcher before dropping back to 91%. Kennyth Lose PA informed of results.

## 2021-08-30 ENCOUNTER — Encounter: Payer: Self-pay | Admitting: Family

## 2021-08-30 ENCOUNTER — Other Ambulatory Visit: Payer: Self-pay

## 2021-08-30 ENCOUNTER — Ambulatory Visit: Payer: Medicare Other | Attending: Family | Admitting: Family

## 2021-08-30 ENCOUNTER — Other Ambulatory Visit: Payer: Self-pay | Admitting: Surgery

## 2021-08-30 VITALS — BP 146/70 | HR 89 | Resp 18 | Ht 70.0 in | Wt 246.1 lb

## 2021-08-30 DIAGNOSIS — I890 Lymphedema, not elsewhere classified: Secondary | ICD-10-CM | POA: Diagnosis not present

## 2021-08-30 DIAGNOSIS — I130 Hypertensive heart and chronic kidney disease with heart failure and stage 1 through stage 4 chronic kidney disease, or unspecified chronic kidney disease: Secondary | ICD-10-CM | POA: Insufficient documentation

## 2021-08-30 DIAGNOSIS — I5032 Chronic diastolic (congestive) heart failure: Secondary | ICD-10-CM | POA: Diagnosis present

## 2021-08-30 DIAGNOSIS — M5030 Other cervical disc degeneration, unspecified cervical region: Secondary | ICD-10-CM | POA: Insufficient documentation

## 2021-08-30 DIAGNOSIS — I739 Peripheral vascular disease, unspecified: Secondary | ICD-10-CM | POA: Diagnosis not present

## 2021-08-30 DIAGNOSIS — Z87891 Personal history of nicotine dependence: Secondary | ICD-10-CM | POA: Diagnosis not present

## 2021-08-30 DIAGNOSIS — K219 Gastro-esophageal reflux disease without esophagitis: Secondary | ICD-10-CM | POA: Diagnosis not present

## 2021-08-30 DIAGNOSIS — I10 Essential (primary) hypertension: Secondary | ICD-10-CM | POA: Diagnosis not present

## 2021-08-30 DIAGNOSIS — G7000 Myasthenia gravis without (acute) exacerbation: Secondary | ICD-10-CM | POA: Diagnosis not present

## 2021-08-30 DIAGNOSIS — J449 Chronic obstructive pulmonary disease, unspecified: Secondary | ICD-10-CM | POA: Diagnosis not present

## 2021-08-30 DIAGNOSIS — E785 Hyperlipidemia, unspecified: Secondary | ICD-10-CM | POA: Diagnosis not present

## 2021-08-30 DIAGNOSIS — N183 Chronic kidney disease, stage 3 unspecified: Secondary | ICD-10-CM | POA: Diagnosis not present

## 2021-08-30 DIAGNOSIS — Z7952 Long term (current) use of systemic steroids: Secondary | ICD-10-CM | POA: Diagnosis not present

## 2021-08-30 DIAGNOSIS — Z8546 Personal history of malignant neoplasm of prostate: Secondary | ICD-10-CM | POA: Insufficient documentation

## 2021-08-30 DIAGNOSIS — M1990 Unspecified osteoarthritis, unspecified site: Secondary | ICD-10-CM | POA: Diagnosis not present

## 2021-08-30 DIAGNOSIS — E039 Hypothyroidism, unspecified: Secondary | ICD-10-CM | POA: Insufficient documentation

## 2021-08-30 DIAGNOSIS — G4730 Sleep apnea, unspecified: Secondary | ICD-10-CM | POA: Insufficient documentation

## 2021-08-30 NOTE — Patient Instructions (Addendum)
Start weighing daily and call for an overnight weight gain of 3 pounds or more or a weekly weight gain of more than 5 pounds.     Put your compression socks on every morning with removal at bedtime

## 2021-08-30 NOTE — Progress Notes (Signed)
Patient ID: Francisco [name], male    DOB: 1946-09-12, 75 y.o.   MRN: [id number]  HPI  [name] is a 75 y/o male with a history of prostate cancer, carotid disease, hyperlipidemia, HTN, CKD, thyroid disease, arthritis, COPD, DDD, GERD, myasthenia gravis, PAD, sleep apnea, previous tobacco use and chronic heart failure.   Echo report from 08/05/20 reviewed and showed an EF of 55-60% along with mild MR without LVH  Was in the ED 08/28/21 due to acute on chronic heart failure. Given IV lasix with improvement of symptoms and he was released.   He presents today for his initial visit with a chief complaint of minimal fatigue upon moderate exertion. He describes this as chronic in nature having been present for several years. Notes that he gets tired as the day progresses. He has associated pedal edema (improving) and easy bruising along with this. He denies any difficulty sleeping, dizziness, abdominal distention, palpitations, chest pain, shortness of breath, cough or wheezing.   Says that the edema has improved since his recent ED visit. Has worn compression socks in the past but had great difficulty in getting them on due to recent increase in his swelling.   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   Carotid stenosis    CHF (congestive heart failure) (HCC)    CKD (chronic kidney disease)    CKD (chronic kidney disease) stage 3, GFR 30-59 ml/min (HCC)    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   DDD (degenerative disc disease), cervical    Dysrhythmia    post carotid stent bradycardia; PAF 09/2020   GERD (gastroesophageal reflux disease)    Hypercholesterolemia    Hypertension    Hypothyroidism    pt takes  Levothyroxine daily   Lumbosacral spinal stenosis    Myasthenia gravis, adult form ([place])    PAD (peripheral artery disease) (HCC)    Shortness of breath    Lung MD- Dr Darlin Coco   Sleep apnea    do not use CPAP every night   Past Surgical History:  Procedure Laterality Date   ANTERIOR CERVICAL DECOMP/DISCECTOMY FUSION  07/18/2011   Procedure: ANTERIOR CERVICAL DECOMPRESSION/DISCECTOMY FUSION 2 LEVELS;  Surgeon: Cooper Render Pool;  Location: [place] [facility];  Service: Neurosurgery;  Laterality: N/A;  cervical five-six, cervical six-seven anterior cervical discectomy and fusion   BACK SURGERY     in [place]     01/2020 Right, 04/2020 Left   CARDIAC CATHETERIZATION     2005 at [facility], no stents   CAROTID PTA/STENT INTERVENTION N/A 09/17/2020   Procedure: CAROTID PTA/STENT INTERVENTION;  Surgeon: Algernon Huxley, MD;  Location: [place] [facility];  Service: Cardiovascular;  Laterality: N/A;   CATARACT EXTRACTION W/PHACO Left 01/06/2020   Procedure: CATARACT EXTRACTION PHACO AND INTRAOCULAR LENS PLACEMENT (IOC) ISTENT INJ LEFT 3.81  00:33.3;  Surgeon: Eulogio Bear, MD;  Location: [place];  Service: Ophthalmology;  Laterality: Left;   CATARACT EXTRACTION W/PHACO Right 02/03/2020   Procedure: CATARACT EXTRACTION PHACO AND INTRAOCULAR LENS PLACEMENT ([place]) RIGHT ISTENT INJ;  Surgeon: Eulogio Bear, MD;  Location: [facility];  Service: Ophthalmology;  Laterality: Right;  4.29 0:35.6   COLONOSCOPY     HERNIA REPAIR Left    inguinal hernia repair in 1985   LUMBAR LAMINECTOMY/DECOMPRESSION MICRODISCECTOMY Left 02/24/2014   Procedure: LUMBAR LAMINECTOMY/DECOMPRESSION MICRODISCECTOMY LUMBAR THREE-FOUR, FOUR-FIVE, LEFT FIVE-SACRAL ONE ;  Surgeon: Charlie Pitter, MD;  Location: [place] [facility];  Service: Neurosurgery;  Laterality: Left;  LUMBAR LAMINECTOMY/DECOMPRESSION MICRODISCECTOMY LUMBAR THREE-FOUR, FOUR-FIVE, LEFT FIVE-SACRAL ONE    LUMBAR  LAMINECTOMY/DECOMPRESSION MICRODISCECTOMY N/A 05/03/2021   Procedure: Laminectomy and Foraminotomy - L2-L3;  Surgeon: Earnie Larsson, MD;  Location: [place];  Service: Neurosurgery;  Laterality: N/A;  3C   POSTERIOR CERVICAL FUSION/FORAMINOTOMY N/A 08/07/2020   Procedure: C3-6 POSTERIOR FUSION WITH DECOMPRESSION;  Surgeon: Meade Maw, MD;  Location: [facility];  Service: Neurosurgery;  Laterality: N/A;   PROSTATECTOMY  8/14   [facility] Dr Mare Ferrari    Family History  Problem Relation Age of Onset   Hypertension Mother    Stroke Mother    Stroke Father    Social History   Tobacco Use   Smoking status: Former    Packs/day: 1.00    Years: 20.00    Pack years: 20.00    Types: Cigarettes    Quit date: 09/12/2001    Years since quitting: 19.9   Smokeless tobacco: Never  Substance Use Topics   Alcohol use: Yes    Alcohol/week: 3.0 standard drinks    Types: 3 Glasses of wine per week    Comment: 3 glasses a wine a week   Allergies  Allergen Reactions   Azithromycin Other (See Comments)    Avoid due to myasthenia gravis   Codeine Nausea And Vomiting   Prior to Admission medications   Medication Sig Start Date End Date Taking? Authorizing Provider  apixaban (ELIQUIS) 5 MG TABS tablet Take 1 tablet (5 mg total) by mouth 2 (two) times daily. Restart 05/08/2021 05/04/21  Yes Viona Gilmore D, NP  atorvastatin (LIPITOR) 10 MG tablet TAKE 1 TABLET(10 MG) BY MOUTH DAILY 08/09/21  Yes Serafina Mitchell, MD  azaTHIOprine (IMURAN) 50 MG tablet Take 150 mg by mouth daily.    Yes [provider]  Cholecalciferol (VITAMIN D3 PO) Take 1 capsule by mouth daily.   Yes [provider]  clopidogrel (PLAVIX) 75 MG tablet Take 1 tablet (75 mg total) by mouth daily at 6 (six) AM. Restart 05/08/2021 05/04/21  Yes Viona Gilmore D, NP  desmopressin (DDAVP) 0.2 MG tablet Take 0.2 mg by mouth at bedtime.   Yes [provider]  furosemide (LASIX) 20 MG tablet Take 40 mg by mouth daily.  02/18/19  Yes [provider]  gabapentin (NEURONTIN) 300 MG capsule Take 300 mg by mouth 2 (two) times daily.   Yes [provider]  HYDROcodone-acetaminophen (NORCO) 10-325 MG tablet Take 1-2 tablets by mouth See admin instructions. Take 1 to 2 tablets every morning, may take 1 tablet every 6 hours as needed for pain   Yes [provider]  Ipratropium-Albuterol (COMBIVENT RESPIMAT) 20-100 MCG/ACT AERS respimat Inhale 1 puff into the lungs every 6 (six) hours as needed for wheezing.   Yes [provider]  levothyroxine (SYNTHROID, LEVOTHROID) 88 MCG tablet Take 88 mcg by mouth daily before breakfast.   Yes [provider]  Magnesium 500 MG TABS Take 500 mg by mouth at bedtime.   Yes [provider]  metoprolol tartrate (LOPRESSOR) 25 MG tablet Take 12.5 mg by mouth daily as needed. For  racing heart.   Yes [provider]  pantoprazole (PROTONIX) 40 MG tablet Take 40 mg by mouth 2 (two) times daily before a meal.    Yes [provider]  pantoprazole (PROTONIX) 40 MG tablet Take 1 tablet by mouth 2 (two) times daily. 05/10/21  Yes [provider]  predniSONE (DELTASONE) 20 MG tablet Take 20 mg by mouth every other day. Taking 20 mg Monday, Wed., and Friday   Yes [provider]  traZODone (DESYREL) 50 MG tablet Take 0.5 tablets (25 mg total) by mouth at bedtime as needed for sleep. Patient taking differently: Take 50 mg by mouth at bedtime. 08/20/20  Yes Love, Ivan Anchors, PA-C  vitamin B-12 (CYANOCOBALAMIN) 1000 MCG tablet Take 1,000 mcg by mouth daily.   Yes [provider]  podofilox (CONDYLOX) 0.5 % external solution Apply 1 application topically See admin instructions. Apply to affected area Monday through Thursday Patient not taking: Reported on 08/30/2021 02/25/21   [provider]  pyridostigmine (MESTINON) 60 MG tablet Take 60 mg by mouth in the morning and at bedtime.  Patient not taking:  Reported on 08/30/2021    [provider]  rOPINIRole (REQUIP) 4 MG tablet Take 4 mg by mouth at bedtime.  Patient not taking: Reported on 08/30/2021    [provider]   Review of Systems  Constitutional:  Positive for fatigue (as the day progresses). Negative for appetite change.  HENT:  Negative for congestion, postnasal drip and sore throat.   Eyes: Negative.   Respiratory:  Negative for cough, chest tightness, shortness of breath and wheezing.   Cardiovascular:  Positive for leg swelling (improving). Negative for chest pain and palpitations.  Gastrointestinal:  Negative for abdominal distention and abdominal pain.  Endocrine: Negative.   Genitourinary: Negative.   Musculoskeletal:  Negative for back pain and neck pain.  Skin: Negative.   Allergic/Immunologic: Negative.   Neurological:  Negative for dizziness and light-headedness.  Hematological:  Negative for adenopathy. Bruises/bleeds easily.  Psychiatric/Behavioral:  Negative for dysphoric mood and sleep disturbance (sleeping on 1 pillow; wears oxygen at 2L at bedtime). The patient is not nervous/anxious.    Vitals:   08/30/21 1233  BP: (!) 146/70  Pulse: 89  Resp: 18  SpO2: 94%  Weight: 246 lb 2 oz (111.6 kg)  Height: 5\' 10"  (1.778 m)   Wt Readings from Last 3 Encounters:  08/30/21 246 lb 2 oz (111.6 kg)  08/28/21 248 lb (112.5 kg)  07/06/21 245 lb (111.1 kg)   Lab Results  Component Value Date   CREATININE 1.09 08/28/2021   CREATININE 1.23 04/30/2021   CREATININE 1.40 (H) 10/28/2020   Physical Exam Vitals and nursing note reviewed. Exam conducted with a chaperone present (wife).  Constitutional:      Appearance: Normal appearance.  HENT:     Head: Normocephalic and atraumatic.  Cardiovascular:     Rate and Rhythm: Normal rate and regular rhythm.  Pulmonary:     Effort: Pulmonary effort is normal. No respiratory distress.     Breath sounds: No wheezing or rales.  Abdominal:     General:  There is no distension.     Palpations: Abdomen is soft.  Musculoskeletal:        General: No tenderness.     Cervical back: Normal range of motion and neck supple.     Right lower leg: Edema (2+ pitting) present.     Left lower leg: Edema (2+ pitting) present.  Skin:  General: Skin is warm and dry.  Neurological:     General: No focal deficit present.     Mental Status: He is alert and oriented to person, place, and time.  Psychiatric:        Mood and Affect: Mood is anxious.        Behavior: Behavior normal.        Thought Content: Thought content normal.    Assessment & Plan:  1: Chronic heart failure with preserved ejection fraction without structural changes- - NYHA class II - euvolemic today - not weighing daily but does have scales; instructed to weigh every morning after using the bathroom and call for an overnight weight gain of > 2 pounds or a weekly weight gain of > 5 pounds - not adding salt and has been trying to eat low sodium items; reviewed the importance of following a 2000mg  sodium diet and a low sodium cookbook was provided - saw cardiology Margarito Courser) 08/10/21; returns March 2023 - BNP 08/28/21 was 261.7  2: HTN with CKD- - BP mildly elevated (146/70) - saw PCP Caryl Comes) 07/22/21 - BMP 08/28/21 reviewed and showed sodium 139, potassium 3.6, creatinine 1.09 and GFR >60  3: COPD- - saw pulmonology Lanney Gins) 07/15/21 - wears oxygen at 2L at bedtime  4: Myasthenia gravis- - saw opthalmology Burnett Harry) 08/11/21 - saw neurology Manuella Ghazi) 07/14/21 - weaning prednisone  5: Lymphedema- - stage 2 - instructed to get a bigger size of compression socks and put them on every morning with removal at bedtime - encouraged to elevate his legs when sitting for long periods of time - limited in his ability to exercise due to swelling - consider compression boots if edema persists and brochure was provided   Medication bottles reviewed.   Return in 1 month or sooner for any  questions/problems before then.

## 2021-09-01 ENCOUNTER — Telehealth (INDEPENDENT_AMBULATORY_CARE_PROVIDER_SITE_OTHER): Payer: Self-pay

## 2021-09-01 NOTE — Telephone Encounter (Signed)
Patient called in wanting to speak with the provider in reference to medication he is taking. He is wanting the provider to let him know if it necessary for him to still take or does he need to quit taking it all together. He is just looking for some medical advise    Please call and advise    clopidogrel (PLAVIX) 75 MG tablet

## 2021-09-01 NOTE — Telephone Encounter (Signed)
With a carotid stent, we recommend you keep taking

## 2021-09-01 NOTE — Telephone Encounter (Signed)
Patient was made aware with medical recommendations and verbalized understanding. Clopidogrel 75mg  with 11 refills has been called into [facility] as requested.

## 2021-09-15 ENCOUNTER — Telehealth: Payer: Self-pay

## 2021-09-15 NOTE — Telephone Encounter (Signed)
Patient called to report an episode last night (09/14/21) where he felt his heart was racing. He states his pulse rate fluctuated between 120's, to as high as 186. He states his blood pressure was lower than normal. The lowest reading was 94/60. He states he took PRN metoprolol around 9 PM and that his heart rate came  down to 106 and he was able to sleep. He states heart rate this morning is 76 and BP is his normal, with systolic 335. Informed patient that I would update Darylene Price, NP and then call him back. I advised him to call his cardiologist office in the meantime. Patient acknowledged and stated he would call [facility].  Georg Ruddle, RN  Update 09/15/21, 10:30 AM: Called patient to inform him that Darylene Price, NP, had sent a message describing patient's episode to his cardiologist office. Patient states he called them as well, and now has an appt with cardiology on Friday 09/17/21.  Advised patient to call if any further symptoms, concerns or questions arise before his appointment.  Georg Ruddle, RN

## 2021-09-20 ENCOUNTER — Ambulatory Visit
Admission: RE | Admit: 2021-09-20 | Discharge: 2021-09-20 | Disposition: A | Discharge status: Home / Self Care | Payer: Medicare Other | Source: Ambulatory Visit | Attending: Urology | Admitting: Urology

## 2021-09-20 DIAGNOSIS — R319 Hematuria, unspecified: Secondary | ICD-10-CM | POA: Diagnosis not present

## 2021-09-20 DIAGNOSIS — C61 Malignant neoplasm of prostate: Secondary | ICD-10-CM | POA: Diagnosis present

## 2021-09-20 MED ORDER — IOHEXOL 350 MG/ML SOLN
100.0000 mL | Freq: Once | INTRAVENOUS | Status: AC | PRN
  Administered 2021-09-20: 100 mL via INTRAVENOUS

## 2021-09-22 ENCOUNTER — Telehealth: Payer: Self-pay

## 2021-09-22 NOTE — Telephone Encounter (Signed)
Patient called to report a 7 pound weight gain since mid December, and reports increased swelling in his lower extremities despite med compliance, wearing compression hose, and adhering to low sodium diet with fluid restriction. Patient has an appt here on 1/19. Patient states he is currently on his way to his regular cardiologist appointment.  Informed patient that per Darylene Price, FNP, we would like him to call us back after his cardiology appointment this afternoon or in the morning and update [place] on his symptoms and cardiology's treatments. Advised him that he could let cardiology know that we can order IV lasix for patient as well if needed. We will await call from patient tomorrow morning to offer further assistance. Georg Ruddle, RN

## 2021-09-23 ENCOUNTER — Other Ambulatory Visit: Payer: Self-pay | Admitting: Family

## 2021-09-23 DIAGNOSIS — I5033 Acute on chronic diastolic (congestive) heart failure: Secondary | ICD-10-CM

## 2021-09-23 NOTE — Progress Notes (Signed)
Patient reports continued weight gain and swelling in his legs/abdomen. Will send for 80mg  IV lasix/ 51meq PO potassium tomorrow. Will check BMP/ BNP as well.   Appt scheduled for 1pm tomorrow and patient informed.

## 2021-09-24 ENCOUNTER — Ambulatory Visit
Admission: RE | Admit: 2021-09-24 | Discharge: 2021-09-24 | Disposition: A | Discharge status: Home / Self Care | Payer: Medicare Other | Source: Ambulatory Visit | Attending: Family | Admitting: Family

## 2021-09-24 ENCOUNTER — Other Ambulatory Visit: Payer: Self-pay | Admitting: Family

## 2021-09-24 DIAGNOSIS — I5033 Acute on chronic diastolic (congestive) heart failure: Secondary | ICD-10-CM

## 2021-09-24 LAB — BASIC METABOLIC PANEL
Anion gap: 9 (ref 5–15)
BUN: 24 mg/dL — ABNORMAL HIGH (ref 8–23)
CO2: 33 mmol/L — ABNORMAL HIGH (ref 22–32)
Calcium: 9 mg/dL (ref 8.9–10.3)
Chloride: 98 mmol/L (ref 98–111)
Creatinine, Ser: 1.35 mg/dL — ABNORMAL HIGH (ref 0.61–1.24)
GFR, Estimated: 54 mL/min — ABNORMAL LOW (ref >60)
Glucose, Bld: 106 mg/dL — ABNORMAL HIGH (ref 70–99)
Potassium: 4.3 mmol/L (ref 3.5–5.1)
Sodium: 140 mmol/L (ref 135–145)

## 2021-09-24 LAB — BRAIN NATRIURETIC PEPTIDE: B Natriuretic Peptide: 366.7 pg/mL — ABNORMAL HIGH (ref 0.0–100.0)

## 2021-09-24 MED ORDER — FUROSEMIDE 10 MG/ML IJ SOLN
INTRAMUSCULAR | Status: AC
  Administered 2021-09-24: 80 mg via INTRAVENOUS
  Filled 2021-09-24: qty 8

## 2021-09-24 MED ORDER — FUROSEMIDE 10 MG/ML IJ SOLN: 80.0000 mg | Freq: Once | INTRAMUSCULAR | Status: AC

## 2021-09-24 MED ORDER — POTASSIUM CHLORIDE CRYS ER 20 MEQ PO TBCR
EXTENDED_RELEASE_TABLET | ORAL | Status: AC
  Administered 2021-09-24: 40 meq via ORAL
  Filled 2021-09-24: qty 2

## 2021-09-24 MED ORDER — POTASSIUM CHLORIDE CRYS ER 20 MEQ PO TBCR: 40.0000 meq | EXTENDED_RELEASE_TABLET | Freq: Once | ORAL | Status: AC

## 2021-09-27 ENCOUNTER — Ambulatory Visit
Admission: RE | Admit: 2021-09-27 | Discharge: 2021-09-27 | Disposition: A | Discharge status: Home / Self Care | Payer: Medicare Other | Source: Ambulatory Visit | Attending: Family | Admitting: Family

## 2021-09-27 DIAGNOSIS — I5033 Acute on chronic diastolic (congestive) heart failure: Secondary | ICD-10-CM | POA: Insufficient documentation

## 2021-09-27 LAB — BASIC METABOLIC PANEL
Anion gap: 9 (ref 5–15)
BUN: 23 mg/dL (ref 8–23)
CO2: 34 mmol/L — ABNORMAL HIGH (ref 22–32)
Calcium: 8.7 mg/dL — ABNORMAL LOW (ref 8.9–10.3)
Chloride: 96 mmol/L — ABNORMAL LOW (ref 98–111)
Creatinine, Ser: 1.45 mg/dL — ABNORMAL HIGH (ref 0.61–1.24)
GFR, Estimated: 50 mL/min — ABNORMAL LOW (ref >60)
Glucose, Bld: 152 mg/dL — ABNORMAL HIGH (ref 70–99)
Potassium: 4.5 mmol/L (ref 3.5–5.1)
Sodium: 139 mmol/L (ref 135–145)

## 2021-09-27 LAB — BRAIN NATRIURETIC PEPTIDE: B Natriuretic Peptide: 258.2 pg/mL — ABNORMAL HIGH (ref 0.0–100.0)

## 2021-09-27 MED ORDER — POTASSIUM CHLORIDE CRYS ER 20 MEQ PO TBCR: 40.0000 meq | EXTENDED_RELEASE_TABLET | Freq: Once | ORAL | Status: AC

## 2021-09-27 MED ORDER — POTASSIUM CHLORIDE CRYS ER 20 MEQ PO TBCR
EXTENDED_RELEASE_TABLET | ORAL | Status: AC
  Administered 2021-09-27: 40 meq via ORAL
  Filled 2021-09-27: qty 2

## 2021-09-27 MED ORDER — FUROSEMIDE 10 MG/ML IJ SOLN
INTRAMUSCULAR | Status: AC
  Administered 2021-09-27: 80 mg via INTRAVENOUS
  Filled 2021-09-27: qty 8

## 2021-09-27 MED ORDER — FUROSEMIDE 10 MG/ML IJ SOLN: 80.0000 mg | Freq: Once | INTRAMUSCULAR | Status: AC

## 2021-09-27 NOTE — Discharge Instructions (Signed)
Contact [facility] for F/U appt.

## 2021-09-29 ENCOUNTER — Telehealth: Payer: Self-pay | Admitting: Family

## 2021-09-29 NOTE — Telephone Encounter (Signed)
Returned patient's call regarding fluctuating BP/HR. He was not close to his book that had the actual readings in it but says that both his BP and HR have been "jumping around all day". He says that both will get high and then fairly quickly come back down and then get low. Does feel fatigued but denies any chest pain or dizziness. Does have a history of atrial fibrillation and cardiology had recently started a beta-blocker.   Advised patient to reach out to his cardiologist regarding his symptoms as he may need an EKG to evaluate further. Patient says that he will call their office and was appreciative of the information.

## 2021-09-29 NOTE — Progress Notes (Signed)
Patient ID: CARMINE CARROZZA, male    DOB: [birth year], 76 y.o.   MRN: [id number]  HPI  Mr Zimmerle is a 76 y/o male with a history of prostate cancer, carotid disease, hyperlipidemia, HTN, CKD, thyroid disease, arthritis, COPD, DDD, GERD, myasthenia gravis, PAD, sleep apnea, previous tobacco use and chronic heart failure.   Echo report from 08/05/20 reviewed and showed an EF of 55-60% along with mild MR without LVH  Was in the ED 08/28/21 due to acute on chronic heart failure. Given IV lasix with improvement of symptoms and he was released.   He presents today for a follow-up visit with a chief complaint of minimal shortness of breath upon moderate exertion. He describes this as chronic in nature having been present for several years. He has associated fatigue, pedal edema (although better), leg weakness (chronic) and knee pain along with this. He denies any difficulty sleeping, dizziness, abdominal distention, palpitations, chest pain, cough or weight gain.   Received IV lasix 3 days ago & once last week with improvement of edema.  Yesterday he had great difficulty with fluctuating HR/ BP. Says his AF caused his HR to get into the 160's and when that happens, he feels the palpitations, shortness of breath and sometimes dizziness. HR eventually came back down to his normal 70's-80's.    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   Carotid stenosis    CHF (congestive heart failure) (HCC)    CKD (chronic kidney disease)    CKD (chronic kidney disease) stage 3, GFR 30-59 ml/min (HCC)    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   DDD (degenerative disc disease), cervical    Dysrhythmia    post carotid stent bradycardia; PAF  09/2020   GERD (gastroesophageal reflux disease)    Hypercholesterolemia    Hypertension    Hypothyroidism    pt takes Levothyroxine daily   Lumbosacral spinal stenosis    Myasthenia gravis, adult form ([place])    PAD (peripheral artery disease) (HCC)    Shortness of breath    Lung MD- Dr Darlin Coco   Sleep apnea    do not use CPAP every night   Past Surgical History:  Procedure Laterality Date   ANTERIOR CERVICAL DECOMP/DISCECTOMY FUSION  07/18/2011   Procedure: ANTERIOR CERVICAL DECOMPRESSION/DISCECTOMY FUSION 2 LEVELS;  Surgeon: Cooper Render Pool;  Location: [place] [facility];  Service: Neurosurgery;  Laterality: N/A;  cervical five-six, cervical six-seven anterior cervical discectomy and fusion   BACK SURGERY     in [place]     01/2020 Right, 04/2020 Left   CARDIAC CATHETERIZATION     2005 at [facility], no stents   CAROTID PTA/STENT INTERVENTION N/A 09/17/2020   Procedure: CAROTID PTA/STENT INTERVENTION;  Surgeon: Algernon Huxley, MD;  Location: [place] [facility];  Service: Cardiovascular;  Laterality: N/A;   CATARACT EXTRACTION W/PHACO Left 01/06/2020   Procedure: CATARACT EXTRACTION PHACO AND INTRAOCULAR LENS PLACEMENT (IOC) ISTENT INJ LEFT 3.81  00:33.3;  Surgeon: Eulogio Bear, MD;  Location: [place];  Service: Ophthalmology;  Laterality: Left;   CATARACT EXTRACTION W/PHACO Right 02/03/2020  Procedure: CATARACT EXTRACTION PHACO AND INTRAOCULAR LENS PLACEMENT ([place]) RIGHT ISTENT INJ;  Surgeon: Eulogio Bear, MD;  Location: [place];  Service: Ophthalmology;  Laterality: Right;  4.29 0:35.6   COLONOSCOPY     HERNIA REPAIR Left    inguinal hernia repair in 1985   LUMBAR LAMINECTOMY/DECOMPRESSION MICRODISCECTOMY Left 02/24/2014   Procedure: LUMBAR LAMINECTOMY/DECOMPRESSION MICRODISCECTOMY LUMBAR THREE-FOUR, FOUR-FIVE, LEFT FIVE-SACRAL ONE ;  Surgeon: Charlie Pitter, MD;  Location: [place] [facility];  Service: Neurosurgery;   Laterality: Left;  LUMBAR LAMINECTOMY/DECOMPRESSION MICRODISCECTOMY LUMBAR THREE-FOUR, FOUR-FIVE, LEFT FIVE-SACRAL ONE    LUMBAR LAMINECTOMY/DECOMPRESSION MICRODISCECTOMY N/A 05/03/2021   Procedure: Laminectomy and Foraminotomy - L2-L3;  Surgeon: Earnie Larsson, MD;  Location: [place];  Service: Neurosurgery;  Laterality: N/A;  3C   POSTERIOR CERVICAL FUSION/FORAMINOTOMY N/A 08/07/2020   Procedure: C3-6 POSTERIOR FUSION WITH DECOMPRESSION;  Surgeon: Meade Maw, MD;  Location: [facility];  Service: Neurosurgery;  Laterality: N/A;   PROSTATECTOMY  8/14   [facility] Dr Mare Ferrari    Family History  Problem Relation Age of Onset   Hypertension Mother    Stroke Mother    Stroke Father    Social History   Tobacco Use   Smoking status: Former    Packs/day: 1.00    Years: 20.00    Pack years: 20.00    Types: Cigarettes    Quit date: 09/12/2001    Years since quitting: 20.0   Smokeless tobacco: Never  Substance Use Topics   Alcohol use: Yes    Alcohol/week: 3.0 standard drinks    Types: 3 Glasses of wine per week    Comment: 3 glasses a wine a week   Allergies  Allergen Reactions   Azithromycin Other (See Comments)    Avoid due to myasthenia gravis   Codeine Nausea And Vomiting   Prior to Admission medications   Medication Sig Start Date End Date Taking? Authorizing Provider  apixaban (ELIQUIS) 5 MG TABS tablet Take 1 tablet (5 mg total) by mouth 2 (two) times daily. Restart 05/08/2021 05/04/21  Yes Viona Gilmore D, NP  atorvastatin (LIPITOR) 10 MG tablet TAKE 1 TABLET(10 MG) BY MOUTH DAILY 08/09/21  Yes Serafina Mitchell, MD  azaTHIOprine (IMURAN) 50 MG tablet Take 150 mg by mouth daily.    Yes [provider]  Cholecalciferol (VITAMIN D3 PO) Take 1 capsule by mouth daily.   Yes [provider]  clopidogrel (PLAVIX) 75 MG tablet Take 1 tablet (75 mg total) by mouth daily at 6 (six) AM. Restart 05/08/2021 05/04/21  Yes Viona Gilmore D, NP  desmopressin (DDAVP) 0.2 MG  tablet Take 0.2 mg by mouth at bedtime.   Yes [provider]  furosemide (LASIX) 20 MG tablet Take 40 mg by mouth daily. 02/18/19  Yes [provider]  gabapentin (NEURONTIN) 300 MG capsule Take 300 mg by mouth 2 (two) times daily.   Yes [provider]  HYDROcodone-acetaminophen (NORCO) 10-325 MG tablet Take 1-2 tablets by mouth See admin instructions. Take 1 to 2 tablets every morning, may take 1 tablet every 6 hours as needed for pain   Yes [provider]  Ipratropium-Albuterol (COMBIVENT RESPIMAT) 20-100 MCG/ACT AERS respimat Inhale 1 puff into the lungs every 6 (six) hours as needed for wheezing.   Yes [provider]  levothyroxine (SYNTHROID, LEVOTHROID) 88 MCG tablet Take 88 mcg by mouth daily before breakfast.   Yes [provider]  Magnesium 500 MG TABS Take 500 mg by mouth at bedtime.  Yes [provider]  metoprolol tartrate (LOPRESSOR) 25 MG tablet Take 12.5 mg by mouth 2 (two) times daily. For racing heart.   Yes [provider]  pantoprazole (PROTONIX) 40 MG tablet Take 40 mg by mouth 2 (two) times daily before a meal.    Yes [provider]  predniSONE (DELTASONE) 20 MG tablet Take 20 mg by mouth every other day. Taking 20 mg Monday, Wed., and Friday   Yes [provider]  traZODone (DESYREL) 50 MG tablet Take 0.5 tablets (25 mg total) by mouth at bedtime as needed for sleep. Patient taking differently: Take 50 mg by mouth at bedtime. 08/20/20  Yes Love, Ivan Anchors, PA-C  vitamin B-12 (CYANOCOBALAMIN) 1000 MCG tablet Take 1,000 mcg by mouth daily.   Yes [provider]  pyridostigmine (MESTINON) 60 MG tablet Take 60 mg by mouth in the morning and at bedtime.  Patient not taking: Reported on 08/30/2021    [provider]   Review of Systems  Constitutional:  Positive for fatigue (as the day progresses). Negative for appetite change.  HENT:  Negative for congestion, postnasal drip  and sore throat.   Eyes: Negative.   Respiratory:  Positive for shortness of breath. Negative for cough, chest tightness and wheezing.   Cardiovascular:  Positive for leg swelling (improving after IV lasix). Negative for chest pain and palpitations.  Gastrointestinal:  Positive for constipation. Negative for abdominal distention and abdominal pain.  Endocrine: Negative.   Genitourinary: Negative.   Musculoskeletal:  Positive for arthralgias (right knee with radiation into the right hip). Negative for back pain and neck pain.  Skin: Negative.   Allergic/Immunologic: Negative.   Neurological:  Positive for weakness (in legs). Negative for dizziness and light-headedness.  Hematological:  Negative for adenopathy. Bruises/bleeds easily.  Psychiatric/Behavioral:  Negative for dysphoric mood and sleep disturbance (sleeping on 1 pillow; wears oxygen at 2L at bedtime). The patient is not nervous/anxious.    Vitals:   09/30/21 1122  BP: (!) 154/70  Pulse: 77  Resp: 18  SpO2: 96%  Weight: 248 lb 8 oz (112.7 kg)  Height: 5\' 10"  (1.778 m)   Wt Readings from Last 3 Encounters:  09/30/21 248 lb 8 oz (112.7 kg)  08/30/21 246 lb 2 oz (111.6 kg)  08/28/21 248 lb (112.5 kg)   Lab Results  Component Value Date   CREATININE 1.45 (H) 09/27/2021   CREATININE 1.35 (H) 09/24/2021   CREATININE 1.09 08/28/2021   Physical Exam Vitals and nursing note reviewed.  Constitutional:      Appearance: Normal appearance.  HENT:     Head: Normocephalic and atraumatic.  Cardiovascular:     Rate and Rhythm: Normal rate and regular rhythm.  Pulmonary:     Effort: Pulmonary effort is normal. No respiratory distress.     Breath sounds: No wheezing or rales.  Abdominal:     General: There is no distension.     Palpations: Abdomen is soft.  Musculoskeletal:        General: No tenderness.     Cervical back: Normal range of motion and neck supple.     Right lower leg: Edema (1+ pitting) present.     Left lower  leg: Edema (1+ pitting) present.  Skin:    General: Skin is warm and dry.  Neurological:     General: No focal deficit present.     Mental Status: He is alert and oriented to person, place, and time.  Psychiatric:  Mood and Affect: Mood is anxious.        Behavior: Behavior normal.        Thought Content: Thought content normal.    Assessment & Plan:  1: Chronic heart failure with preserved ejection fraction without structural changes- - NYHA class II - euvolemic today - weighing daily; reminded to  call for an overnight weight gain of > 2 pounds or a weekly weight gain of > 5 pounds - weight up 2 pounds from last visit here 1 month ago - received IV lasix/ potassium once last week and then again a few days ago & says that his swelling has improved - not adding salt and has been trying to eat low sodium items; reviewed the importance of following a 2000mg  sodium diet  - drinking ~ 50 ounces of fluid daily - saw cardiology Margarito Courser) 09/17/21 - have messaged cardiology about repeating echo as last one was done Nov 2021 - has had variable HR with his AF; encouraged to call cardiology if it happens again - BNP 09/27/21 was 258.2  2: HTN with CKD- - BP mildly elevated (154/70); home BP log reviewed and shows 112-141/52-66 - saw PCP Caryl Comes) 07/22/21 - BMP 09/27/21 reviewed and showed sodium 139, potassium 4.5, creatinine 1.45 and GFR 50  3: COPD- - saw pulmonology Lanney Gins) 09/22/21 and is supposed to be getting NIV - wears oxygen at 2L at bedtime  4: Myasthenia gravis- - saw opthalmology Burnett Harry) 08/11/21 - saw neurology Manuella Ghazi) 07/14/21 - weaning prednisone  5: Lymphedema- - stage 2 - wearing compression socks daily with removal at bedtime - encouraged to elevate his legs when sitting for long periods of time - limited in his ability to exercise due to swelling - consider compression boots if edema continues   Medication bottles reviewed.   Return in 2 months or sooner  for any questions/problems before then.

## 2021-09-30 ENCOUNTER — Other Ambulatory Visit: Payer: Self-pay

## 2021-09-30 ENCOUNTER — Encounter: Payer: Self-pay | Admitting: Family

## 2021-09-30 ENCOUNTER — Ambulatory Visit: Payer: Medicare Other | Attending: Family | Admitting: Family

## 2021-09-30 VITALS — BP 154/70 | HR 77 | Resp 18 | Ht 70.0 in | Wt 248.5 lb

## 2021-09-30 DIAGNOSIS — K219 Gastro-esophageal reflux disease without esophagitis: Secondary | ICD-10-CM | POA: Diagnosis not present

## 2021-09-30 DIAGNOSIS — G7000 Myasthenia gravis without (acute) exacerbation: Secondary | ICD-10-CM

## 2021-09-30 DIAGNOSIS — I890 Lymphedema, not elsewhere classified: Secondary | ICD-10-CM

## 2021-09-30 DIAGNOSIS — I739 Peripheral vascular disease, unspecified: Secondary | ICD-10-CM | POA: Diagnosis not present

## 2021-09-30 DIAGNOSIS — Z8546 Personal history of malignant neoplasm of prostate: Secondary | ICD-10-CM | POA: Diagnosis not present

## 2021-09-30 DIAGNOSIS — I2510 Atherosclerotic heart disease of native coronary artery without angina pectoris: Secondary | ICD-10-CM | POA: Insufficient documentation

## 2021-09-30 DIAGNOSIS — N183 Chronic kidney disease, stage 3 unspecified: Secondary | ICD-10-CM | POA: Insufficient documentation

## 2021-09-30 DIAGNOSIS — Z87891 Personal history of nicotine dependence: Secondary | ICD-10-CM | POA: Insufficient documentation

## 2021-09-30 DIAGNOSIS — I480 Paroxysmal atrial fibrillation: Secondary | ICD-10-CM | POA: Insufficient documentation

## 2021-09-30 DIAGNOSIS — M1990 Unspecified osteoarthritis, unspecified site: Secondary | ICD-10-CM | POA: Insufficient documentation

## 2021-09-30 DIAGNOSIS — E785 Hyperlipidemia, unspecified: Secondary | ICD-10-CM | POA: Diagnosis not present

## 2021-09-30 DIAGNOSIS — I10 Essential (primary) hypertension: Secondary | ICD-10-CM | POA: Diagnosis not present

## 2021-09-30 DIAGNOSIS — I5032 Chronic diastolic (congestive) heart failure: Secondary | ICD-10-CM

## 2021-09-30 DIAGNOSIS — I130 Hypertensive heart and chronic kidney disease with heart failure and stage 1 through stage 4 chronic kidney disease, or unspecified chronic kidney disease: Secondary | ICD-10-CM | POA: Insufficient documentation

## 2021-09-30 DIAGNOSIS — E039 Hypothyroidism, unspecified: Secondary | ICD-10-CM | POA: Diagnosis not present

## 2021-09-30 DIAGNOSIS — J449 Chronic obstructive pulmonary disease, unspecified: Secondary | ICD-10-CM | POA: Diagnosis not present

## 2021-09-30 NOTE — Patient Instructions (Signed)
Continue weighing daily and call for an overnight weight gain of 3 pounds or more or a weekly weight gain of more than 5 pounds.  ° °The [facility] will be moving around the corner to [id number] mid-February. Our phone number will remain the same ° °

## 2021-10-12 ENCOUNTER — Telehealth: Payer: Self-pay | Admitting: Family

## 2021-10-12 ENCOUNTER — Other Ambulatory Visit: Payer: Self-pay | Admitting: Cardiology

## 2021-10-12 ENCOUNTER — Other Ambulatory Visit: Payer: Self-pay | Admitting: Family

## 2021-10-12 ENCOUNTER — Ambulatory Visit (HOSPITAL_BASED_OUTPATIENT_CLINIC_OR_DEPARTMENT_OTHER): Payer: Medicare Other | Admitting: Family

## 2021-10-12 ENCOUNTER — Other Ambulatory Visit: Payer: Self-pay

## 2021-10-12 ENCOUNTER — Other Ambulatory Visit
Admission: RE | Admit: 2021-10-12 | Discharge: 2021-10-12 | Disposition: A | Discharge status: Home / Self Care | Payer: Medicare Other | Source: Ambulatory Visit | Attending: Family | Admitting: Family

## 2021-10-12 ENCOUNTER — Encounter: Payer: Self-pay | Admitting: Family

## 2021-10-12 VITALS — BP 118/53 | HR 72 | Resp 18 | Ht 70.0 in | Wt 245.0 lb

## 2021-10-12 DIAGNOSIS — M1993 Secondary osteoarthritis, unspecified site: Secondary | ICD-10-CM | POA: Insufficient documentation

## 2021-10-12 DIAGNOSIS — J449 Chronic obstructive pulmonary disease, unspecified: Secondary | ICD-10-CM | POA: Insufficient documentation

## 2021-10-12 DIAGNOSIS — E7800 Pure hypercholesterolemia, unspecified: Secondary | ICD-10-CM | POA: Insufficient documentation

## 2021-10-12 DIAGNOSIS — Z9079 Acquired absence of other genital organ(s): Secondary | ICD-10-CM | POA: Insufficient documentation

## 2021-10-12 DIAGNOSIS — Z8546 Personal history of malignant neoplasm of prostate: Secondary | ICD-10-CM | POA: Insufficient documentation

## 2021-10-12 DIAGNOSIS — K219 Gastro-esophageal reflux disease without esophagitis: Secondary | ICD-10-CM | POA: Insufficient documentation

## 2021-10-12 DIAGNOSIS — I5032 Chronic diastolic (congestive) heart failure: Secondary | ICD-10-CM | POA: Insufficient documentation

## 2021-10-12 DIAGNOSIS — I130 Hypertensive heart and chronic kidney disease with heart failure and stage 1 through stage 4 chronic kidney disease, or unspecified chronic kidney disease: Secondary | ICD-10-CM | POA: Insufficient documentation

## 2021-10-12 DIAGNOSIS — M5030 Other cervical disc degeneration, unspecified cervical region: Secondary | ICD-10-CM | POA: Insufficient documentation

## 2021-10-12 DIAGNOSIS — Z79899 Other long term (current) drug therapy: Secondary | ICD-10-CM | POA: Insufficient documentation

## 2021-10-12 DIAGNOSIS — G4730 Sleep apnea, unspecified: Secondary | ICD-10-CM | POA: Insufficient documentation

## 2021-10-12 DIAGNOSIS — N183 Chronic kidney disease, stage 3 unspecified: Secondary | ICD-10-CM | POA: Insufficient documentation

## 2021-10-12 DIAGNOSIS — Z87891 Personal history of nicotine dependence: Secondary | ICD-10-CM | POA: Insufficient documentation

## 2021-10-12 DIAGNOSIS — G7000 Myasthenia gravis without (acute) exacerbation: Secondary | ICD-10-CM

## 2021-10-12 DIAGNOSIS — I340 Nonrheumatic mitral (valve) insufficiency: Secondary | ICD-10-CM | POA: Insufficient documentation

## 2021-10-12 DIAGNOSIS — E039 Hypothyroidism, unspecified: Secondary | ICD-10-CM | POA: Insufficient documentation

## 2021-10-12 DIAGNOSIS — I779 Disorder of arteries and arterioles, unspecified: Secondary | ICD-10-CM | POA: Insufficient documentation

## 2021-10-12 DIAGNOSIS — Z7952 Long term (current) use of systemic steroids: Secondary | ICD-10-CM | POA: Insufficient documentation

## 2021-10-12 DIAGNOSIS — I10 Essential (primary) hypertension: Secondary | ICD-10-CM

## 2021-10-12 DIAGNOSIS — I890 Lymphedema, not elsewhere classified: Secondary | ICD-10-CM | POA: Insufficient documentation

## 2021-10-12 DIAGNOSIS — I739 Peripheral vascular disease, unspecified: Secondary | ICD-10-CM | POA: Insufficient documentation

## 2021-10-12 DIAGNOSIS — Z9981 Dependence on supplemental oxygen: Secondary | ICD-10-CM | POA: Insufficient documentation

## 2021-10-12 LAB — BASIC METABOLIC PANEL
Anion gap: 8 (ref 5–15)
BUN: 22 mg/dL (ref 8–23)
CO2: 34 mmol/L — ABNORMAL HIGH (ref 22–32)
Calcium: 8.8 mg/dL — ABNORMAL LOW (ref 8.9–10.3)
Chloride: 99 mmol/L (ref 98–111)
Creatinine, Ser: 1.45 mg/dL — ABNORMAL HIGH (ref 0.61–1.24)
GFR, Estimated: 50 mL/min — ABNORMAL LOW (ref >60)
Glucose, Bld: 89 mg/dL (ref 70–99)
Potassium: 3.6 mmol/L (ref 3.5–5.1)
Sodium: 141 mmol/L (ref 135–145)

## 2021-10-12 MED ORDER — FUROSEMIDE 20 MG PO TABS
20.0000 mg | ORAL_TABLET | Freq: Two times a day (BID) | ORAL | 3 refills | Status: DC
Start: 2021-10-12 — End: 2021-11-29

## 2021-10-12 NOTE — Progress Notes (Signed)
Refilled furosemide RX

## 2021-10-12 NOTE — Telephone Encounter (Signed)
NP student called patient and reviewed BMP results obtained earlier today. Will have patient take furosemide 20mg  QOD alternating with 30mg  QOD.

## 2021-10-12 NOTE — Progress Notes (Signed)
Patient ID: Francisco [name], male    DOB: 02-17-1946, 76 y.o.   MRN: [id number]  Francisco [name] is a 76 y/o male with a history of prostate cancer, carotid disease, hyperlipidemia, HTN, CKD, thyroid disease, arthritis, COPD, DDD, GERD, myasthenia gravis, PAD, sleep apnea, previous tobacco use and chronic heart failure.   Echo report from 08/05/20 reviewed and showed an EF of 55-60% along with mild Francisco without LVH  Was in the ED 08/28/21 due to acute on chronic heart failure. Given IV lasix with improvement of symptoms and he was released.   He presents today for an acute visit with a chief complaint of minimal shortness of breath upon moderate exertion. He describes this as chronic in nature having been present for several years. He has associated fatigue, pedal edema (although better), leg weakness (chronic) and knee pain along with this. He denies any difficulty sleeping, dizziness, abdominal distention, palpitations, chest pain, cough or weight gain.   He is concerned with continued pedal edema. He reports that today they are better than they have been, but he is still worried. He recently added a third dose of furosemide to his regimen and is taking TID instead of BID.   He weighs daily, and checks his BP daily.   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   Carotid stenosis    CHF (congestive heart failure) (HCC)    CKD (chronic kidney disease)    CKD (chronic kidney disease) stage 3, GFR 30-59 ml/min (HCC)    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   DDD (degenerative disc disease), cervical    Dysrhythmia    post carotid stent bradycardia; PAF 09/2020   GERD (gastroesophageal reflux disease)    Hypercholesterolemia     Hypertension    Hypothyroidism    pt takes Levothyroxine daily   Lumbosacral spinal stenosis    Myasthenia gravis, adult form ([place])    PAD (peripheral artery disease) (HCC)    Shortness of breath    Lung MD- Dr Darlin Coco   Sleep apnea    do not use CPAP every night   Past Surgical History:  Procedure Laterality Date   ANTERIOR CERVICAL DECOMP/DISCECTOMY FUSION  07/18/2011   Procedure: ANTERIOR CERVICAL DECOMPRESSION/DISCECTOMY FUSION 2 LEVELS;  Surgeon: Cooper Render Pool;  Location: [place] [facility];  Service: Neurosurgery;  Laterality: N/A;  cervical five-six, cervical six-seven anterior cervical discectomy and fusion   BACK SURGERY     in [place]     01/2020 Right, 04/2020 Left   CARDIAC CATHETERIZATION     2005 at [facility], no stents   CAROTID PTA/STENT INTERVENTION N/A 09/17/2020   Procedure: CAROTID PTA/STENT INTERVENTION;  Surgeon: Algernon Huxley, MD;  Location: [place] [facility];  Service: Cardiovascular;  Laterality: N/A;   CATARACT EXTRACTION W/PHACO Left 01/06/2020   Procedure: CATARACT EXTRACTION PHACO AND INTRAOCULAR LENS PLACEMENT (IOC) ISTENT INJ LEFT 3.81  00:33.3;  Surgeon: Eulogio Bear, MD;  Location: [place];  Service: Ophthalmology;  Laterality: Left;   CATARACT EXTRACTION W/PHACO Right 02/03/2020   Procedure: CATARACT EXTRACTION PHACO AND INTRAOCULAR LENS PLACEMENT (IOC)  RIGHT ISTENT INJ;  Surgeon: Eulogio Bear, MD;  Location: [place];  Service: Ophthalmology;  Laterality: Right;  4.29 0:35.6   COLONOSCOPY     HERNIA REPAIR Left    inguinal hernia repair in 1985   LUMBAR LAMINECTOMY/DECOMPRESSION MICRODISCECTOMY Left 02/24/2014   Procedure: LUMBAR LAMINECTOMY/DECOMPRESSION MICRODISCECTOMY LUMBAR THREE-FOUR, FOUR-FIVE, LEFT FIVE-SACRAL ONE ;  Surgeon: Charlie Pitter, MD;  Location: [place] [facility];  Service: Neurosurgery;  Laterality: Left;  LUMBAR LAMINECTOMY/DECOMPRESSION MICRODISCECTOMY LUMBAR  THREE-FOUR, FOUR-FIVE, LEFT FIVE-SACRAL ONE    LUMBAR LAMINECTOMY/DECOMPRESSION MICRODISCECTOMY N/A 05/03/2021   Procedure: Laminectomy and Foraminotomy - L2-L3;  Surgeon: Earnie Larsson, MD;  Location: [place];  Service: Neurosurgery;  Laterality: N/A;  3C   POSTERIOR CERVICAL FUSION/FORAMINOTOMY N/A 08/07/2020   Procedure: C3-6 POSTERIOR FUSION WITH DECOMPRESSION;  Surgeon: Meade Maw, MD;  Location: [facility];  Service: Neurosurgery;  Laterality: N/A;   PROSTATECTOMY  8/14   [facility] Dr Mare Ferrari    Family History  Problem Relation Age of Onset   Hypertension Mother    Stroke Mother    Stroke Father    Social History   Tobacco Use   Smoking status: Former    Packs/day: 1.00    Years: 20.00    Pack years: 20.00    Types: Cigarettes    Quit date: 09/12/2001    Years since quitting: 20.0   Smokeless tobacco: Never  Substance Use Topics   Alcohol use: Yes    Alcohol/week: 3.0 standard drinks    Types: 3 Glasses of wine per week    Comment: 3 glasses a wine a week   Allergies  Allergen Reactions   Azithromycin Other (See Comments)    Avoid due to myasthenia gravis   Codeine Nausea And Vomiting   Prior to Admission medications   Medication Sig Start Date End Date Taking? Authorizing Provider  apixaban (ELIQUIS) 5 MG TABS tablet Take 1 tablet (5 mg total) by mouth 2 (two) times daily. Restart 05/08/2021 05/04/21  Yes Viona Gilmore D, NP  atorvastatin (LIPITOR) 10 MG tablet TAKE 1 TABLET(10 MG) BY MOUTH DAILY 08/09/21  Yes Serafina Mitchell, MD  azaTHIOprine (IMURAN) 50 MG tablet Take 150 mg by mouth daily.    Yes [provider]  Cholecalciferol (VITAMIN D3 PO) Take 1 capsule by mouth daily.   Yes [provider]  clopidogrel (PLAVIX) 75 MG tablet Take 1 tablet (75 mg total) by mouth daily at 6 (six) AM. Restart 05/08/2021 05/04/21  Yes Viona Gilmore D, NP  desmopressin (DDAVP) 0.2 MG tablet Take 0.2 mg by mouth at bedtime.   Yes [provider]   furosemide (LASIX) 20 MG tablet Take 40 mg by mouth daily. 02/18/19  Yes [provider]  gabapentin (NEURONTIN) 300 MG capsule Take 300 mg by mouth 2 (two) times daily.   Yes [provider]  HYDROcodone-acetaminophen (NORCO) 10-325 MG tablet Take 1-2 tablets by mouth See admin instructions. Take 1 to 2 tablets every morning, may take 1 tablet every 6 hours as needed for pain   Yes [provider]  Ipratropium-Albuterol (COMBIVENT RESPIMAT) 20-100 MCG/ACT AERS respimat Inhale 1 puff into the lungs every 6 (six) hours as needed for wheezing.   Yes [provider]  levothyroxine (SYNTHROID, LEVOTHROID) 88 MCG tablet Take 88 mcg by mouth daily before breakfast.   Yes [provider]  Magnesium 500 MG TABS Take 500 mg by mouth at bedtime.   Yes [provider]  metoprolol tartrate (LOPRESSOR)  25 MG tablet Take 12.5 mg by mouth 2 (two) times daily. For racing heart.   Yes [provider]  pantoprazole (PROTONIX) 40 MG tablet Take 40 mg by mouth 2 (two) times daily before a meal.    Yes [provider]  predniSONE (DELTASONE) 20 MG tablet Take 20 mg by mouth every other day. Taking 20 mg Monday, Wed., and Friday   Yes [provider]  traZODone (DESYREL) 50 MG tablet Take 0.5 tablets (25 mg total) by mouth at bedtime as needed for sleep. Patient taking differently: Take 50 mg by mouth at bedtime. 08/20/20  Yes Love, Ivan Anchors, PA-C  vitamin B-12 (CYANOCOBALAMIN) 1000 MCG tablet Take 1,000 mcg by mouth daily.   Yes [provider]  pyridostigmine (MESTINON) 60 MG tablet Take 60 mg by mouth in the morning and at bedtime.  Patient not taking: Reported on 08/30/2021    [provider]   Review of Systems  Constitutional:  Positive for fatigue (as the day progresses). Negative for appetite change.  HENT:  Negative for congestion, postnasal drip and sore throat.   Eyes: Negative.   Respiratory:  Positive for  shortness of breath. Negative for cough, chest tightness and wheezing.   Cardiovascular:  Positive for leg swelling. Negative for chest pain and palpitations.  Gastrointestinal:  Negative for abdominal distention, abdominal pain and constipation.  Endocrine: Negative.   Genitourinary: Negative.   Musculoskeletal:  Positive for arthralgias (right knee with radiation into the right hip). Negative for back pain and neck pain.  Skin: Negative.   Allergic/Immunologic: Negative.   Neurological:  Positive for weakness (in legs). Negative for dizziness and light-headedness.  Hematological:  Negative for adenopathy. Bruises/bleeds easily.  Psychiatric/Behavioral:  Negative for dysphoric mood and sleep disturbance (sleeping on 1 pillow; wears oxygen at 2L at bedtime). The patient is not nervous/anxious.    Vitals:   10/12/21 0858  BP: (!) 118/53  Pulse: 72  Resp: 18  SpO2: 96%  Weight: 245 lb (111.1 kg)  Height: 5\' 10"  (1.778 m)   Wt Readings from Last 3 Encounters:  10/12/21 245 lb (111.1 kg)  09/30/21 248 lb 8 oz (112.7 kg)  08/30/21 246 lb 2 oz (111.6 kg)   Lab Results  Component Value Date   CREATININE 1.45 (H) 09/27/2021   CREATININE 1.35 (H) 09/24/2021   CREATININE 1.09 08/28/2021   Physical Exam Vitals and nursing note reviewed. Exam conducted with a chaperone present (wife).  Constitutional:      Appearance: Normal appearance.  HENT:     Head: Normocephalic and atraumatic.  Cardiovascular:     Rate and Rhythm: Normal rate and regular rhythm.  Pulmonary:     Effort: Pulmonary effort is normal. No respiratory distress.     Breath sounds: No wheezing or rales.  Abdominal:     General: There is no distension.     Palpations: Abdomen is soft.  Musculoskeletal:        General: No tenderness.     Cervical back: Normal range of motion and neck supple.     Right lower leg: Edema (1+ pitting) present.     Left lower leg: Edema (1+ pitting) present.  Skin:    General: Skin is  warm and dry.  Neurological:     General: No focal deficit present.     Mental Status: He is alert and oriented to person, place, and time.  Psychiatric:        Mood and Affect: Mood is anxious.  Behavior: Behavior normal.        Thought Content: Thought content normal.    Assessment & Plan:  1: Chronic heart failure with preserved ejection fraction without structural changes- - NYHA class II - euvolemic today - weighing daily; reminded to  call for an overnight weight gain of > 2 pounds or a weekly weight gain of > 5 pounds - weight down 3 lbs from his last visit on 09/30/21 - not adding salt and has been trying to eat low sodium items; reviewed the importance of following a 2000mg  sodium diet  - drinking ~ 50 ounces of fluid daily - saw cardiology Margarito Courser) 09/17/21 - needs repeat echo, per wife cardiologist plans to arrange at next visit - currently taking lasix 20 mg TID, increased his own dose from BID, and has been taking TID x 3 days - will check BMP today - BNP 09/27/21 was 258.2  2: HTN with CKD- - BP looks good 118/53, BP log from home reviewed and shows 373-428'J systolic/ 68-11'X diastolic - saw PCP Caryl Comes) 07/22/21 - BMP 09/27/21 reviewed and showed sodium 139, potassium 4.5, creatinine 1.45 and GFR 50  3: COPD- - saw pulmonology Lanney Gins) 09/22/21 and is supposed to be getting NIV - has not heard from [place], he has called them and plans to f/u back with pulmonology - encouraged him to call pulmonology about pulmonary rehab (was suggested to patient by pulmonology but pt has not heard anything - wears oxygen at 2L at bedtime  4: Myasthenia gravis- - saw opthalmology Burnett Harry) 08/11/21 - saw neurology Manuella Ghazi) 07/14/21 - weaning prednisone  5: Lymphedema- - stage 2 - wearing compression socks daily with removal at bedtime and edema persists - elevating his legs when sitting for long periods of time - limited in his ability to exercise due to swelling - he  is interested in compression boots, provided information and will place order for them   Medication bottles reviewed.   Return on 11/29/21.

## 2021-10-12 NOTE — Patient Instructions (Signed)
The [facility] Clinic will be moving around the corner to [address] mid-February. Our phone number will remain the same.  Continue to weigh and record your weights daily.  Continue to wear your compression stockings daily.  We will check your blood work today.  Call pulmonology and ask again about CPAP status and pulmonary rehab.

## 2021-11-12 ENCOUNTER — Other Ambulatory Visit (HOSPITAL_COMMUNITY): Payer: Self-pay | Admitting: Neurology

## 2021-11-12 ENCOUNTER — Other Ambulatory Visit: Payer: Self-pay | Admitting: Neurology

## 2021-11-12 DIAGNOSIS — R269 Unspecified abnormalities of gait and mobility: Secondary | ICD-10-CM

## 2021-11-23 ENCOUNTER — Other Ambulatory Visit: Payer: Self-pay

## 2021-11-23 ENCOUNTER — Ambulatory Visit
Admission: RE | Admit: 2021-11-23 | Discharge: 2021-11-23 | Disposition: A | Discharge status: Home / Self Care | Payer: Medicare Other | Source: Ambulatory Visit | Attending: Neurology | Admitting: Neurology

## 2021-11-23 DIAGNOSIS — R269 Unspecified abnormalities of gait and mobility: Secondary | ICD-10-CM | POA: Insufficient documentation

## 2021-11-28 NOTE — Progress Notes (Signed)
Patient ID: Francisco [name], male    DOB: [birth year], 76 y.o.   MRN: [id number]  Francisco [name] is a 76 y/o male with a history of prostate cancer, carotid disease, hyperlipidemia, HTN, CKD, thyroid disease, arthritis, COPD, DDD, GERD, myasthenia gravis, PAD, sleep apnea, previous tobacco use and chronic heart failure.   Echo report from 10/19/21 reviewed and showed an EF of 55%, moderate pulmonary HTN, mild biatrial enlargement.   Was in the ED 08/28/21 due to acute on chronic heart failure. Given IV lasix with improvement of symptoms and he was released.   He presents today for a follow up visit with a chief complaint of minimal shortness of breath upon moderate exertion. He describes this as chronic in nature having been present for several years. He has associated fatigue, leg weakness (chronic) and knee pain along with this. He denies any pedal edema, orthopnea, difficulty sleeping, dizziness, abdominal distention, palpitations, chest pain, cough or weight gain.   He states overall he feels good. He is wanting to start to work with PT to help with his lower extremity weakness.   He weighs most days, and checks his BP daily.   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   Carotid stenosis    CHF (congestive heart failure) (HCC)    CKD (chronic kidney disease)    CKD (chronic kidney disease) stage 3, GFR 30-59 ml/min (HCC)    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   DDD (degenerative disc disease), cervical    Dysrhythmia    post carotid stent bradycardia; PAF 09/2020   GERD (gastroesophageal reflux disease)    Hypercholesterolemia    Hypertension    Hypothyroidism    pt takes Levothyroxine daily   Lumbosacral spinal  stenosis    Myasthenia gravis, adult form ([place])    PAD (peripheral artery disease) (HCC)    Shortness of breath    Lung MD- Dr Darlin Coco   Sleep apnea    do not use CPAP every night   Past Surgical History:  Procedure Laterality Date   ANTERIOR CERVICAL DECOMP/DISCECTOMY FUSION  07/18/2011   Procedure: ANTERIOR CERVICAL DECOMPRESSION/DISCECTOMY FUSION 2 LEVELS;  Surgeon: Cooper Render Pool;  Location: [place] [facility];  Service: Neurosurgery;  Laterality: N/A;  cervical five-six, cervical six-seven anterior cervical discectomy and fusion   BACK SURGERY     in [place]     01/2020 Right, 04/2020 Left   CARDIAC CATHETERIZATION     2005 at [facility], no stents   CAROTID PTA/STENT INTERVENTION N/A 09/17/2020   Procedure: CAROTID PTA/STENT INTERVENTION;  Surgeon: Algernon Huxley, MD;  Location: [place] [facility];  Service: Cardiovascular;  Laterality: N/A;   CATARACT EXTRACTION W/PHACO Left 01/06/2020   Procedure: CATARACT EXTRACTION PHACO AND INTRAOCULAR LENS PLACEMENT (IOC) ISTENT INJ LEFT 3.81  00:33.3;  Surgeon: Eulogio Bear, MD;  Location: [place];  Service: Ophthalmology;  Laterality: Left;   CATARACT EXTRACTION W/PHACO Right 02/03/2020   Procedure: CATARACT EXTRACTION PHACO AND INTRAOCULAR LENS PLACEMENT ([place]) RIGHT ISTENT INJ;  Surgeon: Eulogio Bear, MD;  Location: [place];  Service: Ophthalmology;  Laterality: Right;  4.29 0:35.6   COLONOSCOPY     HERNIA REPAIR Left    inguinal hernia repair in 1985   LUMBAR LAMINECTOMY/DECOMPRESSION MICRODISCECTOMY Left 02/24/2014   Procedure: LUMBAR LAMINECTOMY/DECOMPRESSION MICRODISCECTOMY LUMBAR THREE-FOUR, FOUR-FIVE, LEFT FIVE-SACRAL ONE ;  Surgeon: Charlie Pitter, MD;  Location: [place] [facility] OR[facility];  Service: Neurosurgery;  Laterality: Left;  LUMBAR LAMINECTOMY/DECOMPRESSION MICRODISCECTOMY LUMBAR THREE-FOUR, FOUR-FIVE, LEFT FIVE-SACRAL ONE    LUMBAR LAMINECTOMY/DECOMPRESSION MICRODISCECTOMY  N/A 05/03/2021   Procedure: Laminectomy and Foraminotomy - L2-L3;  Surgeon: Earnie Larsson, MD;  Location: [place];  Service: Neurosurgery;  Laterality: N/A;  3C   POSTERIOR CERVICAL FUSION/FORAMINOTOMY N/A 08/07/2020   Procedure: C3-6 POSTERIOR FUSION WITH DECOMPRESSION;  Surgeon: Meade Maw, MD;  Location: [facility] ORS;  Service: Neurosurgery;  Laterality: N/A;   PROSTATECTOMY  8/14   [facility] Dr Mare Ferrari    Family History  Problem Relation Age of Onset   Hypertension Mother    Stroke Mother    Stroke Father    Social History   Tobacco Use   Smoking status: Former    Packs/day: 1.00    Years: 20.00    Pack years: 20.00    Types: Cigarettes    Quit date: 09/12/2001    Years since quitting: 20.2   Smokeless tobacco: Never  Substance Use Topics   Alcohol use: Yes    Alcohol/week: 3.0 standard drinks    Types: 3 Glasses of wine per week    Comment: 3 glasses a wine a week   Allergies  Allergen Reactions   Azithromycin Other (See Comments)    Avoid due to myasthenia gravis   Codeine Nausea And Vomiting   Prior to Admission medications   Medication Sig Start Date End Date Taking? Authorizing Provider  apixaban (ELIQUIS) 5 MG TABS tablet Take 1 tablet (5 mg total) by mouth 2 (two) times daily. Restart 05/08/2021 05/04/21  Yes Viona Gilmore D, NP  atorvastatin (LIPITOR) 10 MG tablet TAKE 1 TABLET(10 MG) BY MOUTH DAILY 08/09/21  Yes Serafina Mitchell, MD  azaTHIOprine (IMURAN) 50 MG tablet Take 150 mg by mouth daily.    Yes [provider]  Cholecalciferol (VITAMIN D3 PO) Take 1 capsule by mouth daily.   Yes [provider]  clopidogrel (PLAVIX) 75 MG tablet Take 1 tablet (75 mg total) by mouth daily at 6 (six) AM. Restart 05/08/2021 05/04/21  Yes Viona Gilmore D, NP  gabapentin (NEURONTIN) 300 MG capsule Take 300 mg by mouth 2 (two) times daily.   Yes [provider]  HYDROcodone-acetaminophen (NORCO) 10-325 MG tablet Take 1-2 tablets by mouth See admin  instructions. Take 1 to 2 tablets every morning, may take 1 tablet every 6 hours as needed for pain   Yes [provider]  Ipratropium-Albuterol (COMBIVENT RESPIMAT) 20-100 MCG/ACT AERS respimat Inhale 1 puff into the lungs every 6 (six) hours as needed for wheezing.   Yes [provider]  levothyroxine (SYNTHROID, LEVOTHROID) 88 MCG tablet Take 88 mcg by mouth daily before breakfast.   Yes [provider]  predniSONE (DELTASONE) 20 MG tablet Take 20 mg by mouth every other day. Taking 20 mg Monday, Wed., and Friday   Yes [provider]  spironolactone (ALDACTONE) 25 MG tablet Take 25 mg by mouth daily. 11/10/21  Yes [provider]  torsemide (DEMADEX) 20 MG tablet Take 20 mg by mouth daily. 11/10/21  Yes [provider]  traZODone (DESYREL) 50 MG tablet Take 0.5 tablets (25 mg total) by mouth  at bedtime as needed for sleep. Patient taking differently: Take 50 mg by mouth at bedtime. 08/20/20  Yes Love, Ivan Anchors, PA-C  carbidopa-levodopa (SINEMET IR) 25-100 MG tablet Take 1 tablet by mouth 3 (three) times daily. 11/11/21   [provider]  Magnesium 500 MG TABS Take 500 mg by mouth at bedtime. Patient not taking: Reported on 11/29/2021    [provider]  metoprolol tartrate (LOPRESSOR) 25 MG tablet Take 12.5 mg by mouth 2 (two) times daily. For racing heart. Patient not taking: Reported on 11/29/2021    [provider]  pantoprazole (PROTONIX) 40 MG tablet Take 40 mg by mouth 2 (two) times daily before a meal.  Patient not taking: Reported on 11/29/2021    [provider]  vitamin B-12 (CYANOCOBALAMIN) 1000 MCG tablet Take 1,000 mcg by mouth daily. Patient not taking: Reported on 11/29/2021    [provider]    Review of Systems  Constitutional:  Positive for fatigue (as the day progresses). Negative for appetite change.  HENT:  Negative for congestion, postnasal drip and sore throat.   Eyes: Negative.    Respiratory:  Negative for cough, chest tightness, shortness of breath and wheezing.   Cardiovascular:  Negative for chest pain, palpitations and leg swelling.  Gastrointestinal:  Negative for abdominal distention, abdominal pain and constipation.  Endocrine: Negative.   Genitourinary: Negative.   Musculoskeletal:  Positive for arthralgias (right knee with radiation into the right hip). Negative for back pain and neck pain.  Skin: Negative.   Allergic/Immunologic: Negative.   Neurological:  Positive for weakness (in legs). Negative for dizziness and light-headedness.  Hematological:  Negative for adenopathy. Bruises/bleeds easily.  Psychiatric/Behavioral:  Negative for dysphoric mood and sleep disturbance (sleeping on 1 pillow; wears oxygen at 2L at bedtime). The patient is not nervous/anxious.    Vitals:   11/29/21 1338  BP: (!) 141/79  Pulse: 85  Resp: 20  SpO2: 96%  Weight: 237 lb (107.5 kg)  Height: '5\' 10"'$  (1.778 m)   Wt Readings from Last 3 Encounters:  11/29/21 237 lb (107.5 kg)  10/12/21 245 lb (111.1 kg)  09/30/21 248 lb 8 oz (112.7 kg)   Lab Results  Component Value Date   CREATININE 1.45 (H) 10/12/2021   CREATININE 1.45 (H) 09/27/2021   CREATININE 1.35 (H) 09/24/2021   Physical Exam Vitals and nursing note reviewed. Exam conducted with a chaperone present (wife).  Constitutional:      General: He is not in acute distress.    Appearance: Normal appearance. He is not ill-appearing.  HENT:     Head: Normocephalic and atraumatic.  Cardiovascular:     Rate and Rhythm: Normal rate and regular rhythm.  Pulmonary:     Effort: Pulmonary effort is normal. No respiratory distress.     Breath sounds: No wheezing or rales.  Abdominal:     General: There is no distension.     Palpations: Abdomen is soft.  Musculoskeletal:        General: No tenderness.     Cervical back: Normal range of motion and neck supple.     Right lower leg: Edema (trace) present.     Left lower  leg: Edema (trace) present.  Skin:    General: Skin is warm and dry.  Neurological:     General: No focal deficit present.     Mental Status: He is alert and oriented to person, place, and time.  Psychiatric:        Mood  and Affect: Mood is anxious.        Behavior: Behavior normal.        Thought Content: Thought content normal.    Assessment & Plan:  1: Chronic heart failure with preserved ejection fraction with structural changes- - NYHA class II - euvolemic today - weighing daily; reminded to  call for an overnight weight gain of > 2 pounds or a weekly weight gain of > 5 pounds - weight down 8 lbs from his last visit on 10/12/21 - not adding salt and has been trying to eat low sodium items; reviewed the importance of following a '2000mg'$  sodium diet  - drinking ~ 50 ounces of fluid daily - saw cardiology (Paras[place]) 10/25/21 - lasix was changed to demadex - on spironolactone  - BNP 09/27/21 was 258.2  2: HTN with CKD- - BP 141/79, BP log from home reviewed and shows 010-272'Z systolic/ 36-64'Q diastolic and reading from today was 108/86. - will see PCP Caryl Comes) 01/24/22 - BMP 09/27/21 reviewed and showed sodium 139, potassium 4.5, creatinine 1.45 and GFR 50  3: COPD- - saw pulmonology Lanney Gins) 09/22/21 - now has CPAP machine that he is trying to get used to - wears oxygen at 2L at bedtime  4: Myasthenia gravis- - saw opthalmology Burnett Harry) 08/11/21 - saw neurology Manuella Ghazi) 11/25/21; now being treated for parkinson's disease  5: Lymphedema- - wearing compression socks daily with removal at bedtime and edema persists - wearing lyphapress boots twice daily    Medication bottles reviewed.   Due to close monitoring by various other providers and HF stability, will not schedule a f/u appointment. Patient and his wife are aware they can return at any time.

## 2021-11-29 ENCOUNTER — Encounter: Payer: Self-pay | Admitting: Family

## 2021-11-29 ENCOUNTER — Ambulatory Visit: Payer: Medicare Other | Attending: Family | Admitting: Family

## 2021-11-29 ENCOUNTER — Other Ambulatory Visit: Payer: Self-pay

## 2021-11-29 VITALS — BP 141/79 | HR 85 | Resp 20 | Ht 70.0 in | Wt 237.0 lb

## 2021-11-29 DIAGNOSIS — Z9989 Dependence on other enabling machines and devices: Secondary | ICD-10-CM | POA: Diagnosis not present

## 2021-11-29 DIAGNOSIS — J449 Chronic obstructive pulmonary disease, unspecified: Secondary | ICD-10-CM

## 2021-11-29 DIAGNOSIS — R0602 Shortness of breath: Secondary | ICD-10-CM | POA: Diagnosis present

## 2021-11-29 DIAGNOSIS — N183 Chronic kidney disease, stage 3 unspecified: Secondary | ICD-10-CM | POA: Insufficient documentation

## 2021-11-29 DIAGNOSIS — G7000 Myasthenia gravis without (acute) exacerbation: Secondary | ICD-10-CM | POA: Diagnosis not present

## 2021-11-29 DIAGNOSIS — E785 Hyperlipidemia, unspecified: Secondary | ICD-10-CM | POA: Insufficient documentation

## 2021-11-29 DIAGNOSIS — I5032 Chronic diastolic (congestive) heart failure: Secondary | ICD-10-CM

## 2021-11-29 DIAGNOSIS — Z87891 Personal history of nicotine dependence: Secondary | ICD-10-CM | POA: Diagnosis not present

## 2021-11-29 DIAGNOSIS — G20 Parkinson's disease: Secondary | ICD-10-CM | POA: Insufficient documentation

## 2021-11-29 DIAGNOSIS — I890 Lymphedema, not elsewhere classified: Secondary | ICD-10-CM

## 2021-11-29 DIAGNOSIS — I739 Peripheral vascular disease, unspecified: Secondary | ICD-10-CM | POA: Insufficient documentation

## 2021-11-29 DIAGNOSIS — I130 Hypertensive heart and chronic kidney disease with heart failure and stage 1 through stage 4 chronic kidney disease, or unspecified chronic kidney disease: Secondary | ICD-10-CM | POA: Insufficient documentation

## 2021-11-29 DIAGNOSIS — I10 Essential (primary) hypertension: Secondary | ICD-10-CM

## 2021-11-29 DIAGNOSIS — Z09 Encounter for follow-up examination after completed treatment for conditions other than malignant neoplasm: Secondary | ICD-10-CM | POA: Insufficient documentation

## 2021-11-29 DIAGNOSIS — K219 Gastro-esophageal reflux disease without esophagitis: Secondary | ICD-10-CM | POA: Insufficient documentation

## 2021-11-29 DIAGNOSIS — Z79899 Other long term (current) drug therapy: Secondary | ICD-10-CM | POA: Diagnosis not present

## 2021-11-29 DIAGNOSIS — Z9981 Dependence on supplemental oxygen: Secondary | ICD-10-CM | POA: Insufficient documentation

## 2021-11-29 NOTE — Patient Instructions (Signed)
Continue to weigh daily.  ? ?Continue to restrict your salt and fluid intake. ? ?Call us if you have a weight gain of 3 lbs/day or 5 lbs/week. ? ? ?

## 2021-12-29 ENCOUNTER — Emergency Department: Payer: Medicare Other

## 2021-12-29 ENCOUNTER — Observation Stay
Admission: EM | Admit: 2021-12-29 | Discharge: 2021-12-31 | Disposition: A | Discharge status: Home / Self Care | Payer: Medicare Other | Attending: Internal Medicine | Admitting: Internal Medicine

## 2021-12-29 ENCOUNTER — Encounter: Payer: Self-pay | Admitting: Emergency Medicine

## 2021-12-29 DIAGNOSIS — I480 Paroxysmal atrial fibrillation: Secondary | ICD-10-CM | POA: Diagnosis present

## 2021-12-29 DIAGNOSIS — D539 Nutritional anemia, unspecified: Secondary | ICD-10-CM | POA: Insufficient documentation

## 2021-12-29 DIAGNOSIS — I509 Heart failure, unspecified: Secondary | ICD-10-CM | POA: Insufficient documentation

## 2021-12-29 DIAGNOSIS — G7000 Myasthenia gravis without (acute) exacerbation: Secondary | ICD-10-CM | POA: Diagnosis present

## 2021-12-29 DIAGNOSIS — G20 Parkinson's disease: Secondary | ICD-10-CM | POA: Insufficient documentation

## 2021-12-29 DIAGNOSIS — J449 Chronic obstructive pulmonary disease, unspecified: Secondary | ICD-10-CM | POA: Insufficient documentation

## 2021-12-29 DIAGNOSIS — N183 Chronic kidney disease, stage 3 unspecified: Secondary | ICD-10-CM | POA: Insufficient documentation

## 2021-12-29 DIAGNOSIS — G20A1 Parkinson's disease without dyskinesia, without mention of fluctuations: Secondary | ICD-10-CM

## 2021-12-29 DIAGNOSIS — Z87891 Personal history of nicotine dependence: Secondary | ICD-10-CM | POA: Diagnosis not present

## 2021-12-29 DIAGNOSIS — Z8546 Personal history of malignant neoplasm of prostate: Secondary | ICD-10-CM | POA: Diagnosis not present

## 2021-12-29 DIAGNOSIS — Z7902 Long term (current) use of antithrombotics/antiplatelets: Secondary | ICD-10-CM | POA: Insufficient documentation

## 2021-12-29 DIAGNOSIS — I130 Hypertensive heart and chronic kidney disease with heart failure and stage 1 through stage 4 chronic kidney disease, or unspecified chronic kidney disease: Secondary | ICD-10-CM | POA: Diagnosis not present

## 2021-12-29 DIAGNOSIS — R7989 Other specified abnormal findings of blood chemistry: Secondary | ICD-10-CM

## 2021-12-29 DIAGNOSIS — Z79899 Other long term (current) drug therapy: Secondary | ICD-10-CM | POA: Diagnosis not present

## 2021-12-29 DIAGNOSIS — R778 Other specified abnormalities of plasma proteins: Secondary | ICD-10-CM | POA: Insufficient documentation

## 2021-12-29 DIAGNOSIS — I471 Supraventricular tachycardia, unspecified: Secondary | ICD-10-CM | POA: Diagnosis present

## 2021-12-29 DIAGNOSIS — E039 Hypothyroidism, unspecified: Secondary | ICD-10-CM | POA: Diagnosis not present

## 2021-12-29 DIAGNOSIS — Z955 Presence of coronary angioplasty implant and graft: Secondary | ICD-10-CM | POA: Diagnosis not present

## 2021-12-29 DIAGNOSIS — Z7901 Long term (current) use of anticoagulants: Secondary | ICD-10-CM | POA: Insufficient documentation

## 2021-12-29 DIAGNOSIS — R000 Tachycardia, unspecified: Secondary | ICD-10-CM | POA: Diagnosis present

## 2021-12-29 LAB — BASIC METABOLIC PANEL
Anion gap: 10 (ref 5–15)
BUN: 26 mg/dL — ABNORMAL HIGH (ref 8–23)
CO2: 28 mmol/L (ref 22–32)
Calcium: 8.8 mg/dL — ABNORMAL LOW (ref 8.9–10.3)
Chloride: 101 mmol/L (ref 98–111)
Creatinine, Ser: 1.43 mg/dL — ABNORMAL HIGH (ref 0.61–1.24)
GFR, Estimated: 50 mL/min — ABNORMAL LOW (ref >60)
Glucose, Bld: 129 mg/dL — ABNORMAL HIGH (ref 70–99)
Potassium: 4.5 mmol/L (ref 3.5–5.1)
Sodium: 139 mmol/L (ref 135–145)

## 2021-12-29 LAB — CBC
HCT: 34.8 % — ABNORMAL LOW (ref 39.0–52.0)
Hemoglobin: 11.2 g/dL — ABNORMAL LOW (ref 13.0–17.0)
MCH: 33.7 pg (ref 26.0–34.0)
MCHC: 32.2 g/dL (ref 30.0–36.0)
MCV: 104.8 fL — ABNORMAL HIGH (ref 80.0–100.0)
Platelets: 303 10*3/uL (ref 150–400)
RBC: 3.32 MIL/uL — ABNORMAL LOW (ref 4.22–5.81)
RDW: 13.3 % (ref 11.5–15.5)
WBC: 8 10*3/uL (ref 4.0–10.5)
nRBC: 0.4 % — ABNORMAL HIGH (ref 0.0–0.2)

## 2021-12-29 LAB — URINALYSIS, ROUTINE W REFLEX MICROSCOPIC
Bilirubin Urine: NEGATIVE
Glucose, UA: NEGATIVE mg/dL
Hgb urine dipstick: NEGATIVE
Ketones, ur: NEGATIVE mg/dL
Leukocytes,Ua: NEGATIVE
Nitrite: NEGATIVE
Protein, ur: NEGATIVE mg/dL
Specific Gravity, Urine: 1.013 (ref 1.005–1.030)
pH: 7 (ref 5.0–8.0)

## 2021-12-29 LAB — TROPONIN I (HIGH SENSITIVITY): Troponin I (High Sensitivity): 36 ng/L — ABNORMAL HIGH (ref <18)

## 2021-12-29 LAB — TSH: TSH: 0.228 u[IU]/mL — ABNORMAL LOW (ref 0.350–4.500)

## 2021-12-29 LAB — MAGNESIUM: Magnesium: 2.3 mg/dL (ref 1.7–2.4)

## 2021-12-29 NOTE — ED Notes (Signed)
Lab called about add ons. ?

## 2021-12-29 NOTE — ED Notes (Signed)
EDP & Spouse at the bedside.  ?

## 2021-12-29 NOTE — ED Triage Notes (Signed)
Pt presents via EMS from home with SVT - HR in the 180s and he received 6 of Adenosine and the pt converted with a HR of 110 afib. 18G RAC. Pt inially called out for tachycardia feeling like his "heart was racing." Pt denies CP.  ?

## 2021-12-29 NOTE — ED Provider Notes (Signed)
? ?[facility] ?Provider Note ? ? ? Event Date/Time  ? First MD Initiated Contact with Patient 12/29/21 2300   ?  (approximate) ? ? ?History  ? ?Tachycardia ? ? ?HPI ? ?Francisco [name] is a 76 y.o. male with history of hypertension, CHF, CKD, COPD, paroxysmal atrial fibrillation, hypothyroidism, myasthenia gravis who presents to the emergency department with EMS for palpitations, shortness of breath, generalized weakness, hypotension and tachycardia at home.  States that he was at rest tonight when he started feeling poorly.  States he felt very weak and checked his blood pressure and it was in the 31S systolic and his heart rate was in the 180s.  He states he had some discomfort in his arms but no chest pressure or tightness.  No vomiting, diarrhea.  He did feel dizzy.  He was found to be in SVT with EMS and given 6 mg of adenosine and converted into a sinus tachycardia with some irregularity.  He has never had SVT before. ? ? ?History provided by patient and wife. ? ? ? ?Past Medical History:  ?Diagnosis Date  ? Arthritis   ? lower left hip  ? Atypical angina ([place])   ? Bilateral hand numbness   ? from back surgery  ? Bronchitis, chronic ([place])   ? Cancer [facility])   ? 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 Carotid stenosis   ? CHF (congestive heart failure) ([place])   ? CKD (chronic kidney disease)   ? CKD (chronic kidney disease) stage 3, GFR 30-59 ml/min (HCC)   ? COPD (chronic obstructive pulmonary disease) ([place])   ? stage 2  ? DDD (degenerative disc disease), cervical   ? Dysrhythmia   ? post carotid stent bradycardia; PAF 09/2020  ? GERD (gastroesophageal reflux disease)   ? Hypercholesterolemia   ? Hypertension   ? Hypothyroidism   ? pt takes Levothyroxine daily  ? Lumbosacral spinal stenosis   ? Myasthenia gravis, adult form ([place])   ? PAD (peripheral artery disease) ([place])   ? Shortness of breath   ? Lung MD- Dr Darlin Coco  ?  Sleep apnea   ? do not use CPAP every night  ? ? ?Past Surgical History:  ?Procedure Laterality Date  ? ANTERIOR CERVICAL DECOMP/DISCECTOMY FUSION  07/18/2011  ? Procedure: ANTERIOR CERVICAL DECOMPRESSION/DISCECTOMY FUSION 2 LEVELS;  Surgeon: Charlie Pitter;  Location: [place] [facility] ORS;  Service: Neurosurgery;  Laterality: N/A;  cervical five-six, cervical six-seven anterior cervical discectomy and fusion  ? BACK SURGERY    ? in [place]    ? 01/2020 Right, 04/2020 Left  ? CARDIAC CATHETERIZATION    ? 2005 at [facility], no stents  ? CAROTID PTA/STENT INTERVENTION N/A 09/17/2020  ? Procedure: CAROTID PTA/STENT INTERVENTION;  Surgeon: Algernon Huxley, MD;  Location: [place] [facility];  Service: Cardiovascular;  Laterality: N/A;  ? CATARACT EXTRACTION W/PHACO Left 01/06/2020  ? Procedure: CATARACT EXTRACTION PHACO AND INTRAOCULAR LENS PLACEMENT (IOC) ISTENT INJ LEFT 3.81  00:33.3;  Surgeon: Eulogio Bear, MD;  Location: [place];  Service: Ophthalmology;  Laterality: Left;  ? CATARACT EXTRACTION W/PHACO Right 02/03/2020  ? Procedure: CATARACT EXTRACTION PHACO AND INTRAOCULAR LENS PLACEMENT ([place]) RIGHT ISTENT INJ;  Surgeon: Eulogio Bear, MD;  Location: [place];  Service: Ophthalmology;  Laterality: Right;  4.29 ?0:35.6  ? COLONOSCOPY    ? HERNIA REPAIR Left   ?  inguinal hernia repair in 1985  ? LUMBAR LAMINECTOMY/DECOMPRESSION MICRODISCECTOMY Left 02/24/2014  ? Procedure: LUMBAR LAMINECTOMY/DECOMPRESSION MICRODISCECTOMY LUMBAR THREE-FOUR, FOUR-FIVE, LEFT FIVE-SACRAL ONE ;  Surgeon: Charlie Pitter, MD;  Location: [facility];  Service: Neurosurgery;  Laterality: Left;  LUMBAR LAMINECTOMY/DECOMPRESSION MICRODISCECTOMY LUMBAR THREE-FOUR, FOUR-FIVE, LEFT FIVE-SACRAL ONE   ? LUMBAR LAMINECTOMY/DECOMPRESSION MICRODISCECTOMY N/A 05/03/2021  ? Procedure: Laminectomy and Foraminotomy - L2-L3;  Surgeon: Earnie Larsson, MD;  Location: [place];  Service: Neurosurgery;  Laterality:  N/A;  3C  ? POSTERIOR CERVICAL FUSION/FORAMINOTOMY N/A 08/07/2020  ? Procedure: C3-6 POSTERIOR FUSION WITH DECOMPRESSION;  Surgeon: Meade Maw, MD;  Location: [facility] ORS;  Service: Neurosurgery;  Laterality: N/A;  ? PROSTATECTOMY  8/14  ? [facility] Dr Mare Ferrari   ? ? ?MEDICATIONS:  ?Prior to Admission medications   ?Medication Sig Start Date End Date Taking? Authorizing Provider  ?apixaban (ELIQUIS) 5 MG TABS tablet Take 1 tablet (5 mg total) by mouth 2 (two) times daily. Restart 05/08/2021 05/04/21   Viona Gilmore D, NP  ?atorvastatin (LIPITOR) 10 MG tablet TAKE 1 TABLET(10 MG) BY MOUTH DAILY 08/09/21   Serafina Mitchell, MD  ?azaTHIOprine (IMURAN) 50 MG tablet Take 150 mg by mouth daily.     [provider]  ?carbidopa-levodopa (SINEMET IR) 25-100 MG tablet Take 1 tablet by mouth 3 (three) times daily. 11/11/21   [provider]  ?Cholecalciferol (VITAMIN D3 PO) Take 1 capsule by mouth daily.    [provider]  ?clopidogrel (PLAVIX) 75 MG tablet Take 1 tablet (75 mg total) by mouth daily at 6 (six) AM. Restart 05/08/2021 05/04/21   Viona Gilmore D, NP  ?gabapentin (NEURONTIN) 300 MG capsule Take 300 mg by mouth 2 (two) times daily.    [provider]  ?HYDROcodone-acetaminophen (NORCO) 10-325 MG tablet Take 1-2 tablets by mouth See admin instructions. Take 1 to 2 tablets every morning, may take 1 tablet every 6 hours as needed for pain    [provider]  ?Ipratropium-Albuterol (COMBIVENT RESPIMAT) 20-100 MCG/ACT AERS respimat Inhale 1 puff into the lungs every 6 (six) hours as needed for wheezing.    [provider]  ?levothyroxine (SYNTHROID, LEVOTHROID) 88 MCG tablet Take 88 mcg by mouth daily before breakfast.    [provider]  ?Magnesium 500 MG TABS Take 500 mg by mouth at bedtime. ?Patient not taking: Reported on 11/29/2021    [provider]  ?metoprolol tartrate (LOPRESSOR) 25 MG tablet Take 12.5 mg by mouth 2 (two) times daily.  For racing heart. ?Patient not taking: Reported on 11/29/2021    [provider]  ?pantoprazole (PROTONIX) 40 MG tablet Take 40 mg by mouth 2 (two) times daily before a meal.  ?Patient not taking: Reported on 11/29/2021    [provider]  ?predniSONE (DELTASONE) 20 MG tablet Take 20 mg by mouth every other day. Taking 20 mg Monday, Wed., and Friday    [provider]  ?spironolactone (ALDACTONE) 25 MG tablet Take 25 mg by mouth daily. 11/10/21   [provider]  ?torsemide (DEMADEX) 20 MG tablet Take 20 mg by mouth daily. 11/10/21   [provider]  ?traZODone (DESYREL) 50 MG tablet Take 0.5 tablets (25 mg total) by mouth at bedtime as needed for sleep. ?Patient taking differently: Take 50 mg by mouth at bedtime. 08/20/20   Love, Ivan Anchors, PA-C  ?vitamin B-12 (CYANOCOBALAMIN) 1000 MCG tablet Take 1,000 mcg by mouth daily. ?Patient not taking: Reported on 11/29/2021    [provider]  ? ? ?Physical Exam  ? ?Triage Vital Signs: ?ED Triage Vitals  ?Enc Vitals Group  ?   BP 12/29/21 2243 135/80  ?   Pulse Rate 12/29/21 2243 100  ?   Resp 12/29/21 2243 20  ?   Temp 12/29/21 2243 97.7 ?F (36.5 ?C)  ?   Temp Source 12/29/21 2243 Oral  ?   SpO2 12/29/21 2243 100 %  ?   Weight 12/29/21 2245 231 lb (104.8 kg)  ?   Height 12/29/21 2245 '5\' 10"'$  (1.778 m)  ?   Head Circumference --   ?   Peak Flow --   ?   Pain Score 12/29/21 2244 0  ?   Pain Loc --   ?   Pain Edu? --   ?   Excl. in [place]? --   ? ? ?Most recent vital signs: ?Vitals:  ? 12/30/21 0100 12/30/21 0130  ?BP: 129/71 129/72  ?Pulse: 71 67  ?Resp: 15 15  ?Temp:    ?SpO2: 97% 95%  ? ? ?CONSTITUTIONAL: Alert and oriented and responds appropriately to questions. Well-appearing; well-nourished ?HEAD: Normocephalic, atraumatic ?EYES: Conjunctivae clear, pupils appear equal, sclera nonicteric ?ENT: normal nose; moist mucous membranes ?NECK: Supple, normal ROM ?CARD: RRR; S1 and S2 appreciated; no murmurs, no clicks, no rubs, no  gallops ?RESP: Normal chest excursion without splinting or tachypnea; breath sounds clear and equal bilaterally; no wheezes, no rhonchi, no rales, no hypoxia or respiratory distress, speaking full sentences ?AB

## 2021-12-30 ENCOUNTER — Observation Stay
Admit: 2021-12-30 | Discharge: 2021-12-30 | Disposition: A | Discharge status: Home / Self Care | Payer: Medicare Other | Attending: Cardiology | Admitting: Cardiology

## 2021-12-30 ENCOUNTER — Other Ambulatory Visit: Payer: Self-pay

## 2021-12-30 DIAGNOSIS — I471 Supraventricular tachycardia, unspecified: Secondary | ICD-10-CM | POA: Diagnosis present

## 2021-12-30 DIAGNOSIS — G7000 Myasthenia gravis without (acute) exacerbation: Secondary | ICD-10-CM

## 2021-12-30 DIAGNOSIS — G20 Parkinson's disease: Secondary | ICD-10-CM

## 2021-12-30 DIAGNOSIS — E039 Hypothyroidism, unspecified: Secondary | ICD-10-CM

## 2021-12-30 DIAGNOSIS — I480 Paroxysmal atrial fibrillation: Secondary | ICD-10-CM

## 2021-12-30 DIAGNOSIS — G20A1 Parkinson's disease without dyskinesia, without mention of fluctuations: Secondary | ICD-10-CM

## 2021-12-30 DIAGNOSIS — R778 Other specified abnormalities of plasma proteins: Secondary | ICD-10-CM | POA: Diagnosis not present

## 2021-12-30 LAB — BASIC METABOLIC PANEL
Anion gap: 5 (ref 5–15)
BUN: 28 mg/dL — ABNORMAL HIGH (ref 8–23)
CO2: 30 mmol/L (ref 22–32)
Calcium: 8.3 mg/dL — ABNORMAL LOW (ref 8.9–10.3)
Chloride: 103 mmol/L (ref 98–111)
Creatinine, Ser: 1.45 mg/dL — ABNORMAL HIGH (ref 0.61–1.24)
GFR, Estimated: 50 mL/min — ABNORMAL LOW (ref >60)
Glucose, Bld: 102 mg/dL — ABNORMAL HIGH (ref 70–99)
Potassium: 4.2 mmol/L (ref 3.5–5.1)
Sodium: 138 mmol/L (ref 135–145)

## 2021-12-30 LAB — CBC
HCT: 31.2 % — ABNORMAL LOW (ref 39.0–52.0)
Hemoglobin: 9.8 g/dL — ABNORMAL LOW (ref 13.0–17.0)
MCH: 33.4 pg (ref 26.0–34.0)
MCHC: 31.4 g/dL (ref 30.0–36.0)
MCV: 106.5 fL — ABNORMAL HIGH (ref 80.0–100.0)
Platelets: 264 10*3/uL (ref 150–400)
RBC: 2.93 MIL/uL — ABNORMAL LOW (ref 4.22–5.81)
RDW: 13.4 % (ref 11.5–15.5)
WBC: 7.9 10*3/uL (ref 4.0–10.5)
nRBC: 0 % (ref 0.0–0.2)

## 2021-12-30 LAB — TROPONIN I (HIGH SENSITIVITY)
Troponin I (High Sensitivity): 173 ng/L (ref <18)
Troponin I (High Sensitivity): 395 ng/L (ref <18)
Troponin I (High Sensitivity): 347 ng/L (ref <18)
Troponin I (High Sensitivity): 314 ng/L (ref <18)

## 2021-12-30 LAB — APTT
aPTT: 28 seconds (ref 24–36)
aPTT: 87 seconds — ABNORMAL HIGH (ref 24–36)

## 2021-12-30 LAB — PROTIME-INR
INR: 1.2 (ref 0.8–1.2)
Prothrombin Time: 15.3 seconds — ABNORMAL HIGH (ref 11.4–15.2)

## 2021-12-30 LAB — T4, FREE: Free T4: 1.24 ng/dL — ABNORMAL HIGH (ref 0.61–1.12)

## 2021-12-30 LAB — HEPARIN LEVEL (UNFRACTIONATED): Heparin Unfractionated: >1.1 IU/mL — ABNORMAL HIGH (ref 0.30–0.70)

## 2021-12-30 MED ORDER — ASPIRIN 81 MG PO CHEW
324.0000 mg | CHEWABLE_TABLET | Freq: Once | ORAL | Status: AC
  Administered 2021-12-30: 324 mg via ORAL
  Filled 2021-12-30: qty 4

## 2021-12-30 MED ORDER — HYDROCODONE-ACETAMINOPHEN 10-325 MG PO TABS
1.0000 | ORAL_TABLET | Freq: Four times a day (QID) | ORAL | Status: DC | PRN
  Administered 2021-12-31: 1 via ORAL
  Filled 2021-12-30: qty 1

## 2021-12-30 MED ORDER — MAGNESIUM HYDROXIDE 400 MG/5ML PO SUSP
30.0000 mL | Freq: Every day | ORAL | Status: DC | PRN
  Administered 2021-12-31: 30 mL via ORAL
  Filled 2021-12-30: qty 30

## 2021-12-30 MED ORDER — GABAPENTIN 300 MG PO CAPS: 300.0000 mg | ORAL_CAPSULE | Freq: Two times a day (BID) | ORAL | Status: DC

## 2021-12-30 MED ORDER — ONDANSETRON HCL 4 MG PO TABS: 4.0000 mg | ORAL_TABLET | Freq: Four times a day (QID) | ORAL | Status: DC | PRN

## 2021-12-30 MED ORDER — ASPIRIN EC 325 MG PO TBEC
325.0000 mg | DELAYED_RELEASE_TABLET | Freq: Every day | ORAL | Status: DC
  Administered 2021-12-31: 325 mg via ORAL
  Filled 2021-12-30: qty 1

## 2021-12-30 MED ORDER — BISACODYL 10 MG RE SUPP: 10.0000 mg | Freq: Every day | RECTAL | Status: DC | PRN

## 2021-12-30 MED ORDER — VITAMIN D3 25 MCG (1000 UNIT) PO TABS
1000.0000 [IU] | ORAL_TABLET | Freq: Every day | ORAL | Status: DC
Start: 2021-12-30 — End: 2021-12-30

## 2021-12-30 MED ORDER — ATORVASTATIN CALCIUM 80 MG PO TABS
80.0000 mg | ORAL_TABLET | Freq: Every day | ORAL | Status: DC
Start: 2021-12-30 — End: 2021-12-31
  Administered 2021-12-30 – 2021-12-31 (×2): 80 mg via ORAL
  Filled 2021-12-30: qty 1
  Filled 2021-12-30: qty 4

## 2021-12-30 MED ORDER — IPRATROPIUM-ALBUTEROL 0.5-2.5 (3) MG/3ML IN SOLN: 3.0000 mL | Freq: Four times a day (QID) | RESPIRATORY_TRACT | Status: DC | PRN

## 2021-12-30 MED ORDER — ACETAMINOPHEN 650 MG RE SUPP: 650.0000 mg | Freq: Four times a day (QID) | RECTAL | Status: DC | PRN

## 2021-12-30 MED ORDER — METOPROLOL SUCCINATE ER 25 MG PO TB24
12.5000 mg | ORAL_TABLET | Freq: Two times a day (BID) | ORAL | Status: DC | PRN
  Filled 2021-12-30: qty 0.5

## 2021-12-30 MED ORDER — HYDROCODONE-ACETAMINOPHEN 10-325 MG PO TABS
1.0000 | ORAL_TABLET | Freq: Every day | ORAL | Status: DC
  Administered 2021-12-30 – 2021-12-31 (×2): 1 via ORAL
  Filled 2021-12-30 (×3): qty 1

## 2021-12-30 MED ORDER — ACETAMINOPHEN 325 MG PO TABS
650.0000 mg | ORAL_TABLET | Freq: Four times a day (QID) | ORAL | Status: DC | PRN
Start: 2021-12-30 — End: 2021-12-31

## 2021-12-30 MED ORDER — CLOPIDOGREL BISULFATE 75 MG PO TABS
75.0000 mg | ORAL_TABLET | Freq: Every day | ORAL | Status: DC
  Administered 2021-12-30 – 2021-12-31 (×2): 75 mg via ORAL
  Filled 2021-12-30 (×2): qty 1

## 2021-12-30 MED ORDER — SPIRONOLACTONE 25 MG PO TABS
25.0000 mg | ORAL_TABLET | Freq: Every day | ORAL | Status: DC
  Administered 2021-12-30 – 2021-12-31 (×2): 25 mg via ORAL
  Filled 2021-12-30 (×2): qty 1

## 2021-12-30 MED ORDER — ONDANSETRON HCL 4 MG/2ML IJ SOLN
4.0000 mg | Freq: Four times a day (QID) | INTRAMUSCULAR | Status: DC | PRN
Start: 2021-12-30 — End: 2021-12-31
  Administered 2021-12-31: 4 mg via INTRAVENOUS
  Filled 2021-12-30: qty 2

## 2021-12-30 MED ORDER — NITROGLYCERIN 0.4 MG SL SUBL
0.4000 mg | SUBLINGUAL_TABLET | SUBLINGUAL | Status: DC | PRN
Start: 2021-12-30 — End: 2021-12-31

## 2021-12-30 MED ORDER — LEVOTHYROXINE SODIUM 50 MCG PO TABS
50.0000 ug | ORAL_TABLET | Freq: Every day | ORAL | Status: DC
  Administered 2021-12-31: 50 ug via ORAL
  Filled 2021-12-30: qty 1

## 2021-12-30 MED ORDER — HEPARIN BOLUS VIA INFUSION
4000.0000 [IU] | Freq: Once | INTRAVENOUS | Status: AC
  Administered 2021-12-30: 4000 [IU] via INTRAVENOUS
  Filled 2021-12-30: qty 4000

## 2021-12-30 MED ORDER — VITAMIN B-12 1000 MCG PO TABS: 1000.0000 ug | ORAL_TABLET | Freq: Every day | ORAL | Status: DC

## 2021-12-30 MED ORDER — TRAZODONE HCL 50 MG PO TABS: 25.0000 mg | ORAL_TABLET | Freq: Every evening | ORAL | Status: DC | PRN

## 2021-12-30 MED ORDER — IPRATROPIUM-ALBUTEROL 20-100 MCG/ACT IN AERS: 1.0000 | INHALATION_SPRAY | Freq: Four times a day (QID) | RESPIRATORY_TRACT | Status: DC | PRN

## 2021-12-30 MED ORDER — ATORVASTATIN CALCIUM 20 MG PO TABS: 10.0000 mg | ORAL_TABLET | Freq: Every day | ORAL | Status: DC

## 2021-12-30 MED ORDER — TRAZODONE HCL 50 MG PO TABS
50.0000 mg | ORAL_TABLET | Freq: Every day | ORAL | Status: DC
Start: 2021-12-30 — End: 2021-12-31
  Administered 2021-12-30: 50 mg via ORAL
  Filled 2021-12-30 (×2): qty 1

## 2021-12-30 MED ORDER — SODIUM CHLORIDE 0.9 % IV SOLN: INTRAVENOUS | Status: DC

## 2021-12-30 MED ORDER — AZATHIOPRINE 50 MG PO TABS
150.0000 mg | ORAL_TABLET | Freq: Every day | ORAL | Status: DC
  Administered 2021-12-30 – 2021-12-31 (×2): 150 mg via ORAL
  Filled 2021-12-30 (×2): qty 3

## 2021-12-30 MED ORDER — HEPARIN (PORCINE) 25000 UT/250ML-% IV SOLN
1300.0000 [IU]/h | INTRAVENOUS | Status: DC
  Administered 2021-12-30 – 2021-12-31 (×2): 1300 [IU]/h via INTRAVENOUS
  Filled 2021-12-30 (×2): qty 250

## 2021-12-30 MED ORDER — PREDNISONE 20 MG PO TABS
20.0000 mg | ORAL_TABLET | ORAL | Status: DC
  Administered 2021-12-31: 20 mg via ORAL
  Filled 2021-12-30: qty 1

## 2021-12-30 MED ORDER — LEVOTHYROXINE SODIUM 88 MCG PO TABS
88.0000 ug | ORAL_TABLET | Freq: Every day | ORAL | Status: DC
  Administered 2021-12-30: 88 ug via ORAL
  Filled 2021-12-30: qty 1

## 2021-12-30 MED ORDER — TORSEMIDE 20 MG PO TABS
20.0000 mg | ORAL_TABLET | Freq: Every day | ORAL | Status: DC
  Administered 2021-12-30 – 2021-12-31 (×2): 20 mg via ORAL
  Filled 2021-12-30 (×2): qty 1

## 2021-12-30 MED ORDER — CARBIDOPA-LEVODOPA 25-100 MG PO TABS
1.0000 | ORAL_TABLET | Freq: Three times a day (TID) | ORAL | Status: DC
  Administered 2021-12-30 – 2021-12-31 (×4): 1 via ORAL
  Filled 2021-12-30 (×5): qty 1

## 2021-12-30 MED ORDER — HYDROCODONE-ACETAMINOPHEN 10-325 MG PO TABS: 1.0000 | ORAL_TABLET | ORAL | Status: DC

## 2021-12-30 MED ORDER — SENNOSIDES-DOCUSATE SODIUM 8.6-50 MG PO TABS
1.0000 | ORAL_TABLET | Freq: Once | ORAL | Status: AC
  Administered 2021-12-30: 1 via ORAL
  Filled 2021-12-30: qty 1

## 2021-12-30 MED ORDER — APIXABAN 5 MG PO TABS: 5.0000 mg | ORAL_TABLET | Freq: Two times a day (BID) | ORAL | Status: DC

## 2021-12-30 MED ORDER — MORPHINE SULFATE (PF) 2 MG/ML IV SOLN: 2.0000 mg | INTRAVENOUS | Status: DC | PRN

## 2021-12-30 NOTE — Consult Note (Signed)
ANTICOAGULATION CONSULT NOTE - Initial Consult ? ?Pharmacy Consult for Heparin ?Indication: chest pain/ACS ? ?Allergies  ?Allergen Reactions  ? Azithromycin Other (See Comments)  ?  Avoid due to myasthenia gravis  ? Codeine Nausea And Vomiting  ? ? ?Patient Measurements: ?Height: '5\' 10"'$  (177.8 cm) ?Weight: 104.8 kg (231 lb) ?IBW/kg (Calculated) : 73 ?Heparin Dosing Weight: 95.3 kg ? ?Vital Signs: ?Temp: 97.7 ?F (36.5 ?C) (04/19 2243) ?Temp Source: Oral (04/19 2243) ?BP: 134/58 (04/20 0700) ?Pulse Rate: 74 (04/20 0700) ? ?Labs: ?Recent Labs  ?  12/29/21 ?2244 12/30/21 ?0040 12/30/21 ?0450  ?HGB 11.2*  --  9.8*  ?HCT 34.8*  --  31.2*  ?PLT 303  --  264  ?CREATININE 1.43*  --  1.45*  ?TROPONINIHS 36* 173*  --   ? ? ?Estimated Creatinine Clearance: 51.7 mL/min (A) (by C-G formula based on SCr of 1.45 mg/dL (H)). ? ? ?Medical History: ?Past Medical History:  ?Diagnosis Date  ? Arthritis   ? lower left hip  ? Atypical angina ([place])   ? Bilateral hand numbness   ? from back surgery  ? Bronchitis, chronic ([place])   ? Cancer [facility])   ? 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 Carotid stenosis   ? CHF (congestive heart failure) ([place])   ? CKD (chronic kidney disease)   ? CKD (chronic kidney disease) stage 3, GFR 30-59 ml/min (HCC)   ? COPD (chronic obstructive pulmonary disease) ([place])   ? stage 2  ? DDD (degenerative disc disease), cervical   ? Dysrhythmia   ? post carotid stent bradycardia; PAF 09/2020  ? GERD (gastroesophageal reflux disease)   ? Hypercholesterolemia   ? Hypertension   ? Hypothyroidism   ? pt takes Levothyroxine daily  ? Lumbosacral spinal stenosis   ? Myasthenia gravis, adult form ([place])   ? PAD (peripheral artery disease) ([place])   ? Shortness of breath   ? Lung MD- Dr Darlin Coco  ? Sleep apnea   ? do not use CPAP every night  ? ? ?Medications:  ?Eliquis '5mg'$  BID PTA  ? ?Assessment: ?Pharmacy has been consulted to initiate and monitor heparin infusion in  76yo male with history of paroxysmal atrial fibrillation admitted with tachycardia. Initial troponin levels of 36 and 173. Patient's last dose of Eliquis was taken~2000 on 12/29/21. Baseline labs are ordered and pending. ? ?Goal of Therapy:  ?Heparin level 0.3-0.7 units/ml ?aPTT 66-102 seconds ?Monitor platelets by anticoagulation protocol: Yes ?  ?Plan:  ?Give 4000 units bolus x 1 ?Start heparin infusion at 1300 units/hr ?Check aPTT level in 8 hours and HL daily while on heparin until both levels correlate, then will transition to HL dosing only. ?Continue to monitor H&H and platelets ? ?Nizar Cutler A Lynnleigh Soden ?12/30/2021,8:57 AM ? ? ?

## 2021-12-30 NOTE — Consult Note (Signed)
ANTICOAGULATION CONSULT NOTE - Initial Consult ? ?Pharmacy Consult for Heparin ?Indication: chest pain/ACS ? ?Allergies  ?Allergen Reactions  ? Azithromycin Other (See Comments)  ?  Avoid due to myasthenia gravis  ? Codeine Nausea And Vomiting  ? ? ?Patient Measurements: ?Height: '5\' 10"'$  (177.8 cm) ?Weight: 104.8 kg (231 lb) ?IBW/kg (Calculated) : 73 ?Heparin Dosing Weight: 95.3 kg ? ?Vital Signs: ?Temp: 97.8 ?F (36.6 ?C) (04/20 1648) ?Temp Source: Oral (04/20 1648) ?BP: 134/65 (04/20 1530) ?Pulse Rate: 70 (04/20 1530) ? ?Labs: ?Recent Labs  ?  12/29/21 ?2244 12/30/21 ?0040 12/30/21 ?0450 12/30/21 ?4098 12/30/21 ?1191 12/30/21 ?1243 12/30/21 ?1816  ?HGB 11.2*  --  9.8*  --   --   --   --   ?HCT 34.8*  --  31.2*  --   --   --   --   ?PLT 303  --  264  --   --   --   --   ?APTT  --   --   --  28  --   --  87*  ?LABPROT  --   --   --  15.3*  --   --   --   ?INR  --   --   --  1.2  --   --   --   ?HEPARINUNFRC  --   --   --  >1.10*  --   --   --   ?CREATININE 1.43*  --  1.45*  --   --   --   --   ?TROPONINIHS 36* 173*  --   --  395* 347*  --   ? ? ? ?Estimated Creatinine Clearance: 51.7 mL/min (A) (by C-G formula based on SCr of 1.45 mg/dL (H)). ? ? ?Medical History: ?Past Medical History:  ?Diagnosis Date  ? Arthritis   ? lower left hip  ? Atypical angina ([place])   ? Bilateral hand numbness   ? from back surgery  ? Bronchitis, chronic ([place])   ? Cancer [facility])   ? 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 Carotid stenosis   ? CHF (congestive heart failure) ([place])   ? CKD (chronic kidney disease)   ? CKD (chronic kidney disease) stage 3, GFR 30-59 ml/min (HCC)   ? COPD (chronic obstructive pulmonary disease) ([place])   ? stage 2  ? DDD (degenerative disc disease), cervical   ? Dysrhythmia   ? post carotid stent bradycardia; PAF 09/2020  ? GERD (gastroesophageal reflux disease)   ? Hypercholesterolemia   ? Hypertension   ? Hypothyroidism   ? pt takes Levothyroxine daily  ?  Lumbosacral spinal stenosis   ? Myasthenia gravis, adult form ([place])   ? PAD (peripheral artery disease) ([place])   ? Shortness of breath   ? Lung MD- Dr Darlin Coco  ? Sleep apnea   ? do not use CPAP every night  ? ? ?Medications:  ?Eliquis '5mg'$  BID PTA  ? ?Assessment: ?Pharmacy has been consulted to initiate and monitor heparin infusion in 76yo male with history of paroxysmal atrial fibrillation admitted with tachycardia. Initial troponin levels of 36 and 173. Patient's last dose of Eliquis was taken~2000 on 12/29/21. Baseline labs are ordered and pending. ? ?Goal of Therapy:  ?Heparin level 0.3-0.7 units/ml ?aPTT 66-102 seconds ?Monitor platelets by anticoagulation protocol: Yes ?  ?Plan:  ?4/20'@1816'$ : aPTT 87 sec, therapeutic x 1 ?Continue heparin infusion at 1300 units/hr ?Check aPTT and HL  with AM labs while on heparin until both levels correlate, then will transition to HL dosing only. ?Continue to monitor H&H and platelets ? ?Shaylin Blatt A Ustin Cruickshank ?12/30/2021,6:41 PM ? ? ?

## 2021-12-30 NOTE — Assessment & Plan Note (Signed)
-   This could be related to his SVT.  Significant elevation is concerning for possibly developing non-STEMI. ?- 2D echo and cardiology consult will be obtained as mentioned above. ?

## 2021-12-30 NOTE — ED Notes (Addendum)
Pt placed on 2L [place] while sleeping per Dr. Leonides Schanz due to not having his oxygen machine he uses at night.  ?

## 2021-12-30 NOTE — Progress Notes (Signed)
*  PRELIMINARY RESULTS* ?Echocardiogram ?2D Echocardiogram has been performed. ? ?Francisco [name], [place] Side ?12/30/2021, 1:16 PM ?

## 2021-12-30 NOTE — Assessment & Plan Note (Signed)
-   We will continue Sinemet ER ?

## 2021-12-30 NOTE — Consult Note (Signed)
?CARDIOLOGY CONSULT NOTE  ? ?   ?    ?  ? ? ? ?Patient ID: ?Francisco [name] ?MRN: [id number] ?DOB/AGE: [birth year] 76 y.o. ? ?Admit date: 12/29/2021 ?Referring Physician Sherilyn Dacosta, MD hospitalist ?Primary Physician Kerrin Mo discharge MD primary ?Primary Cardiologist Dr. Saralyn Pilar cardiology ?Reason for Consultation SVT ? ?HPI: Patient is a 76 year old male with a history of atrial fibrillation paroxysmal on anticoagulation and rate control patient presented with tachycardia and hypertension requiring chemical cardioversion with adenosine.  Patient converted from a narrow complex SVT of 170 down to 80 normal sinus rhythm.  Patient denies any significant chest pain no significant coronary disease patient's had significant history of congestive heart failure shortness of breath GERD renal insufficiency obesity myasthenia gravis.  We will increase beta-blockade therapy slightly to 25 twice a day.  The patient is on myasthenia brevis which limits her beta-blocker usage.  Patient denies any additional palpitations or tachycardia ? ?Review of systems complete and found to be negative unless listed above  ? ? ? ?Past Medical History:  ?Diagnosis Date  ? Arthritis   ? lower left hip  ? Atypical angina ([place])   ? Bilateral hand numbness   ? from back surgery  ? Bronchitis, chronic ([place])   ? Cancer [facility])   ? 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 Carotid stenosis   ? CHF (congestive heart failure) ([place])   ? CKD (chronic kidney disease)   ? CKD (chronic kidney disease) stage 3, GFR 30-59 ml/min (HCC)   ? COPD (chronic obstructive pulmonary disease) ([place])   ? stage 2  ? DDD (degenerative disc disease), cervical   ? Dysrhythmia   ? post carotid stent bradycardia; PAF 09/2020  ? GERD (gastroesophageal reflux disease)   ? Hypercholesterolemia   ? Hypertension   ? Hypothyroidism   ? pt takes Levothyroxine daily  ? Lumbosacral spinal stenosis   ? Myasthenia gravis,  adult form ([place])   ? PAD (peripheral artery disease) ([place])   ? Shortness of breath   ? Lung MD- Dr Darlin Coco  ? Sleep apnea   ? do not use CPAP every night  ?  ?Past Surgical History:  ?Procedure Laterality Date  ? ANTERIOR CERVICAL DECOMP/DISCECTOMY FUSION  07/18/2011  ? Procedure: ANTERIOR CERVICAL DECOMPRESSION/DISCECTOMY FUSION 2 LEVELS;  Surgeon: Charlie Pitter;  Location: [place] [facility];  Service: Neurosurgery;  Laterality: N/A;  cervical five-six, cervical six-seven anterior cervical discectomy and fusion  ? BACK SURGERY    ? in [place]    ? 01/2020 Right, 04/2020 Left  ? CARDIAC CATHETERIZATION    ? 2005 at [facility], no stents  ? CAROTID PTA/STENT INTERVENTION N/A 09/17/2020  ? Procedure: CAROTID PTA/STENT INTERVENTION;  Surgeon: Algernon Huxley, MD;  Location: [place] CV LAB;  Service: Cardiovascular;  Laterality: N/A;  ? CATARACT EXTRACTION W/PHACO Left 01/06/2020  ? Procedure: CATARACT EXTRACTION PHACO AND INTRAOCULAR LENS PLACEMENT (IOC) ISTENT INJ LEFT 3.81  00:33.3;  Surgeon: Eulogio Bear, MD;  Location: [facility];  Service: Ophthalmology;  Laterality: Left;  ? CATARACT EXTRACTION W/PHACO Right 02/03/2020  ? Procedure: CATARACT EXTRACTION PHACO AND INTRAOCULAR LENS PLACEMENT ([place]) RIGHT ISTENT INJ;  Surgeon: Eulogio Bear, MD;  Location: [facility];  Service: Ophthalmology;  Laterality: Right;  4.29 ?0:35.6  ? COLONOSCOPY    ? HERNIA REPAIR Left   ? inguinal hernia repair in 1985  ?  LUMBAR LAMINECTOMY/DECOMPRESSION MICRODISCECTOMY Left 02/24/2014  ? Procedure: LUMBAR LAMINECTOMY/DECOMPRESSION MICRODISCECTOMY LUMBAR THREE-FOUR, FOUR-FIVE, LEFT FIVE-SACRAL ONE ;  Surgeon: Charlie Pitter, MD;  Location: [facility];  Service: Neurosurgery;  Laterality: Left;  LUMBAR LAMINECTOMY/DECOMPRESSION MICRODISCECTOMY LUMBAR THREE-FOUR, FOUR-FIVE, LEFT FIVE-SACRAL ONE   ? LUMBAR LAMINECTOMY/DECOMPRESSION MICRODISCECTOMY N/A 05/03/2021  ? Procedure:  Laminectomy and Foraminotomy - L2-L3;  Surgeon: Earnie Larsson, MD;  Location: [facility];  Service: Neurosurgery;  Laterality: N/A;  3C  ? POSTERIOR CERVICAL FUSION/FORAMINOTOMY N/A 08/07/2020  ? Procedure: C3-6 POSTERIOR FUSION WITH DECOMPRESSION;  Surgeon: Meade Maw, MD;  Location: [facility] ORS;  Service: Neurosurgery;  Laterality: N/A;  ? PROSTATECTOMY  8/14  ? [facility] Dr Mare Ferrari   ?  ?(Not in a hospital admission) ? ?Social History  ? ?Socioeconomic History  ? Marital status: Married  ?  Spouse name: [name]  ? Number of children: Not on file  ? Years of education: Not on file  ? Highest education level: Not on file  ?Occupational History  ? Not on file  ?Tobacco Use  ? Smoking status: Former  ?  Packs/day: 1.00  ?  Years: 20.00  ?  Pack years: 20.00  ?  Types: Cigarettes  ?  Quit date: 09/12/2001  ?  Years since quitting: 20.3  ? Smokeless tobacco: Never  ?Vaping Use  ? Vaping Use: Never used  ?Substance and Sexual Activity  ? Alcohol use: Yes  ?  Alcohol/week: 3.0 standard drinks  ?  Types: 3 Glasses of wine per week  ?  Comment: 3 glasses a wine a week  ? Drug use: No  ? Sexual activity: Yes  ?Other Topics Concern  ? Not on file  ?Social History Narrative  ? Not on file  ? ?Social Determinants of Health  ? ?Financial Resource Strain: Not on file  ?Food Insecurity: Not on file  ?Transportation Needs: Not on file  ?Physical Activity: Not on file  ?Stress: Not on file  ?Social Connections: Not on file  ?Intimate Partner Violence: Not on file  ?  ?Family History  ?Problem Relation Age of Onset  ? Hypertension Mother   ? Stroke Mother   ? Stroke Father   ?  ? ? ?Review of systems complete and found to be negative unless listed above  ? ? ? ? ?PHYSICAL EXAM ? ?General: Well developed, well nourished, in no acute distress ?HEENT:  Normocephalic and atramatic ?Neck:  No JVD.  ?Lungs: Clear bilaterally to auscultation and percussion. ?Heart: HRRR . Normal S1 and S2 without gallops or murmurs.  ?Abdomen: Bowel sounds are  positive, abdomen soft and non-tender  ?Msk:  Back normal, normal gait. Normal strength and tone for age. ?Extremities: No clubbing, cyanosis or edema.   ?Neuro: Alert and oriented X 3. ?Psych:  Good affect, responds appropriately ? ?Labs: ?  ?Lab Results  ?Component Value Date  ? WBC 7.9 12/30/2021  ? HGB 9.8 (L) 12/30/2021  ? HCT 31.2 (L) 12/30/2021  ? MCV 106.5 (H) 12/30/2021  ? PLT 264 12/30/2021  ?  ?Recent Labs  ?Lab 12/30/21 ?0450  ?NA 138  ?K 4.2  ?CL 103  ?CO2 30  ?BUN 28*  ?CREATININE 1.45*  ?CALCIUM 8.3*  ?GLUCOSE 102*  ? ?Lab Results  ?Component Value Date  ? CKTOTAL 106 03/02/2014  ? CKMB 2.0 03/02/2014  ? TROPONINI 0.03 03/02/2014  ? No results found for: CHOL ?No results found for: HDL ?No results found for: [place] ?No results found for: TRIG ?No results  found for: CHOLHDL ?No results found for: LDLDIRECT  ?  ?Radiology: DG Chest 1 View ? ?Result Date: 12/29/2021 ?CLINICAL DATA:  Supraventricular tachycardia EXAM: CHEST  1 VIEW COMPARISON:  08/28/2021 FINDINGS: Stable elevation of the right hemidiaphragm with right basilar atelectasis or scar. The lungs are otherwise clear. No pneumothorax or pleural effusion. Cardiac size within normal limits. No acute bone abnormality. IMPRESSION: Stable elevation of the right hemidiaphragm. No radiographic evidence of acute cardiopulmonary disease. Electronically Signed   By: Fidela Salisbury M.D.   On: 12/29/2021 23:33   ? ?EKG:1 Initial EKG atrial fibrillation 178 narrow complex ?         2 Normal sinus rhythm rate of 70 nonspecific ST-T changes ? ?ASSESSMENT AND PLAN:  ?SVT ?History of atrial fibrillation ?Hypertension ?Elevated troponin ?Probably demand ischemia ?Congestive heart failure ?Chronic renal insufficiency ?COPD ?Shortness of breath ?Peripheral vascular disease ? ?. ?Plan ?Continue anticoagulation for atrial fibrillation ?Agreed with current medical cardioversions for SVT ?Recommend increasing metoprolol to 25 mg twice a day ?Maintain inhalers for COPD  asthma ?Continue blood pressure management and control with metoprolol spironolactone ?Continue Lipitor therapy for lipid management ?Continue diuretic therapy as necessary for congestive heart failure ?Agr

## 2021-12-30 NOTE — Assessment & Plan Note (Signed)
-   The patient may be over replaced. ?- We will cut down Synthroid to 50 mcg p.o. daily. ?- TSH can be as followed with thyroid profile in 4 to 6 weeks. ?- We will continue beta-blocker therapy given current hyperthyroid status. ?

## 2021-12-30 NOTE — Progress Notes (Signed)
?PROGRESS NOTE ? ? ? ?Francisco THERIEN  MBT:[id number] DOB: 02-Jun-1946 DOA: 12/29/2021 ?PCP: Adin Hector, MD  ? ? ?Assessment & Plan: ?  ?Principal Problem: ?  SVT (supraventricular tachycardia) ([place]) ?Active Problems: ?  Hypothyroidism ? ?SVT: etiology unclear. Continue on tele. Cardio consulted ? ?Elevated troponin: likely secondary to demand ischemia from SVT. Continue on tele. Cardio consulted ? ?Parkinson's disease: continue on home dose of sinemet  ? ?Hypothyroidism: continue on reduced dose of levothyroxine. Will need repeat TSH in 4-6 weeks  ? ?PAF: hold home dose of eliquis. Metoprolol prn  ? ?Myasthenia gravis: continue on home dose of imuran  ? ?Gross hematuria: intermittent. Etiology unclear, UTI vs kidney stone. Urine cx ordered  ? ?Macrocytic anemia: will check B12 & folate levels ? ?Obesity: BMI 33.1. Complicates overall care & prognosis ? ?Generalized weakness: PT/OT consulted  ? ? ?DVT prophylaxis: IV heparin  ?Code Status: full  ?Family Communication: discussed pt's care w/ pt's family at bedside and answered their questions  ?Disposition Plan: likely d/c back home ? ?Level of care: Telemetry Cardiac ? ?Status is: Observation ?The patient remains OBS appropriate and will d/c before 2 midnights. ? ? ? ?Consultants:  ?Cardio  ? ?Procedures: ? ?Antimicrobials:  ? ? ?Subjective: ?Pt c/o hematuria ? ?Objective: ?Vitals:  ? 12/30/21 0330 12/30/21 0600 12/30/21 0632 12/30/21 0700  ?BP: (!) 142/77 (!) 161/77 (!) 111/56 (!) 134/58  ?Pulse: 61 73 74 74  ?Resp: '16  16 18  '$ ?Temp:      ?TempSrc:      ?SpO2: 97% 96% 99% 96%  ?Weight:      ?Height:      ? ?No intake or output data in the 24 hours ending 12/30/21 0821 ?Filed Weights  ? 12/29/21 2245  ?Weight: 104.8 kg  ? ? ?Examination: ? ?General exam: Appears calm and comfortable  ?Respiratory system: Clear to auscultation. Respiratory effort normal. ?Cardiovascular system: S1 & S2 +. No rubs, gallops or clicks.  ?Gastrointestinal system: Abdomen is obese,  soft and nontender.  Normal bowel sounds heard. ?Central nervous system: Alert and oriented. Moves all extremities  ?Psychiatry: Judgement and insight appear normal. Mood & affect appropriate.  ? ? ? ?Data Reviewed: I have personally reviewed following labs and imaging studies ? ?CBC: ?Recent Labs  ?Lab 12/29/21 ?2244 12/30/21 ?0450  ?WBC 8.0 7.9  ?HGB 11.2* 9.8*  ?HCT 34.8* 31.2*  ?MCV 104.8* 106.5*  ?PLT 303 264  ? ?Basic Metabolic Panel: ?Recent Labs  ?Lab 12/29/21 ?2244 12/30/21 ?0450  ?NA 139 138  ?K 4.5 4.2  ?CL 101 103  ?CO2 28 30  ?GLUCOSE 129* 102*  ?BUN 26* 28*  ?CREATININE 1.43* 1.45*  ?CALCIUM 8.8* 8.3*  ?MG 2.3  --   ? ?GFR: ?Estimated Creatinine Clearance: 51.7 mL/min (A) (by C-G formula based on SCr of 1.45 mg/dL (H)). ?Liver Function Tests: ?No results for input(s): AST, ALT, ALKPHOS, BILITOT, PROT, ALBUMIN in the last 168 hours. ?No results for input(s): LIPASE, AMYLASE in the last 168 hours. ?No results for input(s): AMMONIA in the last 168 hours. ?Coagulation Profile: ?No results for input(s): INR, PROTIME in the last 168 hours. ?Cardiac Enzymes: ?No results for input(s): CKTOTAL, CKMB, CKMBINDEX, TROPONINI in the last 168 hours. ?BNP (last 3 results) ?No results for input(s): PROBNP in the last 8760 hours. ?HbA1C: ?No results for input(s): HGBA1C in the last 72 hours. ?CBG: ?No results for input(s): GLUCAP in the last 168 hours. ?Lipid Profile: ?No results for input(s): CHOL,  HDL, LDLCALC, TRIG, CHOLHDL, LDLDIRECT in the last 72 hours. ?Thyroid Function Tests: ?Recent Labs  ?  12/29/21 ?2244  ?TSH 0.228*  ?FREET4 1.24*  ? ?Anemia Panel: ?No results for input(s): VITAMINB12, FOLATE, FERRITIN, TIBC, IRON, RETICCTPCT in the last 72 hours. ?Sepsis Labs: ?No results for input(s): PROCALCITON, LATICACIDVEN in the last 168 hours. ? ?No results found for this or any previous visit (from the past 240 hour(s)).  ? ? ? ? ? ?Radiology Studies: ?DG Chest 1 View ? ?Result Date: 12/29/2021 ?CLINICAL DATA:   Supraventricular tachycardia EXAM: CHEST  1 VIEW COMPARISON:  08/28/2021 FINDINGS: Stable elevation of the right hemidiaphragm with right basilar atelectasis or scar. The lungs are otherwise clear. No pneumothorax or pleural effusion. Cardiac size within normal limits. No acute bone abnormality. IMPRESSION: Stable elevation of the right hemidiaphragm. No radiographic evidence of acute cardiopulmonary disease. Electronically Signed   By: Fidela Salisbury M.D.   On: 12/29/2021 23:33   ? ? ? ? ? ?Scheduled Meds: ? apixaban  5 mg Oral BID  ? atorvastatin  10 mg Oral Daily  ? azaTHIOprine  150 mg Oral Daily  ? carbidopa-levodopa  1 tablet Oral TID  ? clopidogrel  75 mg Oral Q0600  ? HYDROcodone-acetaminophen  1-2 tablet Oral Q breakfast  ? levothyroxine  88 mcg Oral Q0600  ? [START ON 12/31/2021] predniSONE  20 mg Oral Once per day on Mon Wed Fri  ? spironolactone  25 mg Oral Daily  ? torsemide  20 mg Oral Daily  ? traZODone  50 mg Oral QHS  ? ?Continuous Infusions: ? sodium chloride 100 mL/hr at 12/30/21 0336  ? ? ? LOS: 0 days  ? ? ?Time spent: 33 mins  ? ? ? ?Wyvonnia Dusky, MD ?[facility] ?Pager [phone] ? ?If 7PM-7AM, please contact night-coverage ? ?12/30/2021, 8:21 AM  ? ?

## 2021-12-30 NOTE — Assessment & Plan Note (Addendum)
-   The patient will be admitted to an observation cardiac telemetry bed. ?- We will follow serial troponins. ?- We will obtain a 2D echo and a cardiology consult. ?- I was Dr. Clayborn Bigness about the patient. ?

## 2021-12-30 NOTE — ED Notes (Signed)
Pt given the [facility] to take home pain medication by Dr. Leonides Schanz. ?

## 2021-12-30 NOTE — H&P (Addendum)
?  ?  ?[place] ? ? ?PATIENT NAME: Francisco [name]   ? ?MR#:  [id number] ? ?DATE OF BIRTH:  11/30/1945 ? ?DATE OF ADMISSION:  12/29/2021 ? ?PRIMARY CARE PHYSICIAN: Adin Hector, MD  ? ?Patient is coming from: Home ? ?REQUESTING/REFERRING PHYSICIAN: Ward, Algernon Huxley, DO ? ?CHIEF COMPLAINT:  ? ?Chief Complaint  ?Patient presents with  ? Tachycardia  ? ? ?HISTORY OF PRESENT ILLNESS:  ?Francisco [name] is a 76 y.o. Caucasian male with medical history significant for multiple medical problems that are mentioned below, who presented to the emergency room with acute onset of midsternal chest pain felt as pressure with associated palpitations with radiation to his left arm as well as associated dyspnea.  The patient was noted to be in SVT with a rate of 180s which has responded to IV adenosine given by EMS I converted him to sinus tachycardia.  He admitted to nausea without vomiting.  No cough or wheezing.  No dysuria, oliguria or hematuria or flank pain. ? ?ED Course: When the patient came to the ER vital signs were within normal.  Labs revealed a BUN of 26 and creatinine 1.43 close to baseline.  High-sensitivity troponin was 36 and later 173.  CBC showed anemia worse than previous levels with hemoglobin 11.2 and 34.8.  TSH was 0.228 and free T41.24.  UA was unremarkable.   ? ?Portable chest ray showed stable elevation of the right hemidiaphragm with no acute cardiopulmonary disease . ?EKG as reviewed by me : .  EKG showedSinus tachycardia with rate of 101 with low voltage QRS and poor R wave progression. ? ?The patient was given 40 aspirin ER.  He will be admitted to an observation cardiac telemetry bed for further evaluation and management. ?PAST MEDICAL HISTORY:  ? ?Past Medical History:  ?Diagnosis Date  ? Arthritis   ? lower left hip  ? Atypical angina ([place])   ? Bilateral hand numbness   ? from back surgery  ? Bronchitis, chronic ([place])   ? Cancer [facility])   ? 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 Carotid stenosis   ? CHF (congestive heart failure) ([place])   ? CKD (chronic kidney disease)   ? CKD (chronic kidney disease) stage 3, GFR 30-59 ml/min (HCC)   ? COPD (chronic obstructive pulmonary disease) ([place])   ? stage 2  ? DDD (degenerative disc disease), cervical   ? Dysrhythmia   ? post carotid stent bradycardia; PAF 09/2020  ? GERD (gastroesophageal reflux disease)   ? Hypercholesterolemia   ? Hypertension   ? Hypothyroidism   ? pt takes Levothyroxine daily  ? Lumbosacral spinal stenosis   ? Myasthenia gravis, adult form ([place])   ? PAD (peripheral artery disease) ([place])   ? Shortness of breath   ? Lung MD- Dr Darlin Coco  ? Sleep apnea   ? do not use CPAP every night  ? ? ?PAST SURGICAL HISTORY:  ? ?Past Surgical History:  ?Procedure Laterality Date  ? ANTERIOR CERVICAL DECOMP/DISCECTOMY FUSION  07/18/2011  ? Procedure: ANTERIOR CERVICAL DECOMPRESSION/DISCECTOMY FUSION 2 LEVELS;  Surgeon: Charlie Pitter;  Location: [place] [facility];  Service: Neurosurgery;  Laterality: N/A;  cervical five-six, cervical six-seven anterior cervical discectomy and fusion  ? BACK SURGERY    ? in [place]    ? 01/2020 Right, 04/2020 Left  ? CARDIAC CATHETERIZATION    ?  2005 at [facility], no stents  ? CAROTID PTA/STENT INTERVENTION N/A 09/17/2020  ? Procedure: CAROTID PTA/STENT INTERVENTION;  Surgeon: Algernon Huxley, MD;  Location: [place] [facility];  Service: Cardiovascular;  Laterality: N/A;  ? CATARACT EXTRACTION W/PHACO Left 01/06/2020  ? Procedure: CATARACT EXTRACTION PHACO AND INTRAOCULAR LENS PLACEMENT (IOC) ISTENT INJ LEFT 3.81  00:33.3;  Surgeon: Eulogio Bear, MD;  Location: [place];  Service: Ophthalmology;  Laterality: Left;  ? CATARACT EXTRACTION W/PHACO Right 02/03/2020  ? Procedure: CATARACT EXTRACTION PHACO AND INTRAOCULAR LENS PLACEMENT ([place]) RIGHT ISTENT INJ;  Surgeon: Eulogio Bear, MD;  Location: [place];   Service: Ophthalmology;  Laterality: Right;  4.29 ?0:35.6  ? COLONOSCOPY    ? HERNIA REPAIR Left   ? inguinal hernia repair in 1985  ? LUMBAR LAMINECTOMY/DECOMPRESSION MICRODISCECTOMY Left 02/24/2014  ? Procedure: LUMBAR LAMINECTOMY/DECOMPRESSION MICRODISCECTOMY LUMBAR THREE-FOUR, FOUR-FIVE, LEFT FIVE-SACRAL ONE ;  Surgeon: Charlie Pitter, MD;  Location: [facility];  Service: Neurosurgery;  Laterality: Left;  LUMBAR LAMINECTOMY/DECOMPRESSION MICRODISCECTOMY LUMBAR THREE-FOUR, FOUR-FIVE, LEFT FIVE-SACRAL ONE   ? LUMBAR LAMINECTOMY/DECOMPRESSION MICRODISCECTOMY N/A 05/03/2021  ? Procedure: Laminectomy and Foraminotomy - L2-L3;  Surgeon: Earnie Larsson, MD;  Location: [place];  Service: Neurosurgery;  Laterality: N/A;  3C  ? POSTERIOR CERVICAL FUSION/FORAMINOTOMY N/A 08/07/2020  ? Procedure: C3-6 POSTERIOR FUSION WITH DECOMPRESSION;  Surgeon: Meade Maw, MD;  Location: [facility] ORS;  Service: Neurosurgery;  Laterality: N/A;  ? PROSTATECTOMY  8/14  ? [facility] Dr Mare Ferrari   ? ? ?SOCIAL HISTORY:  ? ?Social History  ? ?Tobacco Use  ? Smoking status: Former  ?  Packs/day: 1.00  ?  Years: 20.00  ?  Pack years: 20.00  ?  Types: Cigarettes  ?  Quit date: 09/12/2001  ?  Years since quitting: 20.3  ? Smokeless tobacco: Never  ?Substance Use Topics  ? Alcohol use: Yes  ?  Alcohol/week: 3.0 standard drinks  ?  Types: 3 Glasses of wine per week  ?  Comment: 3 glasses a wine a week  ? ? ?FAMILY HISTORY:  ? ?Family History  ?Problem Relation Age of Onset  ? Hypertension Mother   ? Stroke Mother   ? Stroke Father   ? ? ?DRUG ALLERGIES:  ? ?Allergies  ?Allergen Reactions  ? Azithromycin Other (See Comments)  ?  Avoid due to myasthenia gravis  ? Codeine Nausea And Vomiting  ? ? ?REVIEW OF SYSTEMS:  ? ?ROS ?As per history of present illness. All pertinent systems were reviewed above. Constitutional, HEENT, cardiovascular, respiratory, GI, GU, musculoskeletal, neuro, psychiatric, endocrine, integumentary and hematologic systems were reviewed  and are otherwise negative/unremarkable except for positive findings mentioned above in the HPI. ? ? ?MEDICATIONS AT HOME:  ? ?Prior to Admission medications   ?Medication Sig Start Date End Date Taking? Authorizing Provider  ?amoxicillin-clavulanate (AUGMENTIN) 500-125 MG tablet Take 1 tablet by mouth every 12 (twelve) hours. 12/24/21 12/31/21 Yes [provider]  ?apixaban (ELIQUIS) 5 MG TABS tablet Take 1 tablet (5 mg total) by mouth 2 (two) times daily. Restart 05/08/2021 05/04/21  Yes Viona Gilmore D, NP  ?atorvastatin (LIPITOR) 10 MG tablet TAKE 1 TABLET(10 MG) BY MOUTH DAILY 08/09/21  Yes Serafina Mitchell, MD  ?azaTHIOprine (IMURAN) 50 MG tablet Take 150 mg by mouth daily.    Yes [provider]  ?carbidopa-levodopa (SINEMET IR) 25-100 MG tablet Take 1 tablet by mouth 3 (three) times daily. 11/11/21  Yes [provider]  ?clopidogrel (PLAVIX) 75  MG tablet Take 1 tablet (75 mg total) by mouth daily at 6 (six) AM. Restart 05/08/2021 05/04/21  Yes Viona Gilmore D, NP  ?HYDROcodone-acetaminophen (NORCO) 10-325 MG tablet Take 1-2 tablets by mouth See admin instructions. Take 1 to 2 tablets every morning, may take 1 tablet every 6 hours as needed for pain   Yes [provider]  ?ibuprofen (ADVIL) 200 MG tablet Take 400 mg by mouth every 6 (six) hours as needed.   Yes [provider]  ?ipratropium-albuterol (DUONEB) 0.5-2.5 (3) MG/3ML SOLN Take 3 mLs by nebulization every 4 (four) hours as needed. 12/16/21  Yes [provider]  ?levothyroxine (SYNTHROID) 88 MCG tablet Take 88 mcg by mouth daily. 12/27/21  Yes [provider]  ?metoprolol tartrate (LOPRESSOR) 25 MG tablet Take 12.5 mg by mouth 2 (two) times daily as needed. For racing heart.   Yes [provider]  ?predniSONE (DELTASONE) 20 MG tablet Take 20 mg by mouth every other day. Taking 20 mg Monday, Wed., and Friday   Yes [provider]  ?spironolactone (ALDACTONE) 25 MG tablet Take 25  mg by mouth daily. 11/10/21  Yes [provider]  ?torsemide (DEMADEX) 20 MG tablet Take 20 mg by mouth daily. 11/10/21  Yes [provider]  ?traZODone (DESYREL) 50 MG tablet Take 0.5 tab

## 2021-12-30 NOTE — Assessment & Plan Note (Signed)
-   We will continue Imuran ?

## 2021-12-30 NOTE — Assessment & Plan Note (Signed)
-   We will continue Eliquis and beta-blocker therapy. ?

## 2021-12-30 NOTE — Evaluation (Signed)
Physical Therapy Evaluation ?Patient Details ?Name: Francisco [name] ?MRN: [id number] ?DOB: [birth year] ?Today's Date: 12/30/2021 ? ?History of Present Illness ? ANTOINNE SPADACCINI is a 76 y.o. male with history of hypertension, CHF, CKD, COPD, paroxysmal atrial fibrillation, hypothyroidism, myasthenia gravis who presents to the emergency department with EMS for palpitations, shortness of breath, generalized weakness, hypotension and tachycardia at home. ? ?  ?Clinical Impression ? Pt received in Semi-Fowler's position and agreeable to therapy.  Pt reports he was seen at [facility] prior to having back surgery last year and has not walked since that surgery.  Pt notes he does ambulate short distances, but not as much as he would like to or as much as he was doing prior to the surgical intervention.  Pt is able to perform bed-level exercises with good technique, requiring minA for full ROM of the L LE when performing SLR and eccentric control down.  Pt then performed bed transfer in which he required two hand hold support from therapist to come upright seated EOB.  Pt then transferred to standing without any AD and has good stability for when rollator placed in front of him.  Pt then ambulated ~70 feet out the door and back to the room where he was left sitting EOB.  HR remained WNL and nursing notified of mobility and position at conclusion of therapy.  Current discharge plans to home with HHPT remain appropriate at this time.  Pt will continue to benefit from skilled therapy in order to address deficits listed below. ? ?   ? ?Recommendations for follow up therapy are one component of a multi-disciplinary discharge planning process, led by the attending physician.  Recommendations may be updated based on patient status, additional functional criteria and insurance authorization. ? ?Follow Up Recommendations Home health PT ? ?  ?Assistance Recommended at Discharge PRN  ?Patient can return home with the following ? A  little help with walking and/or transfers;A little help with bathing/dressing/bathroom ? ?  ?Equipment Recommendations None recommended by PT  ?Recommendations for Other Services ? Rehab consult  ?  ?Functional Status Assessment Patient has had a recent decline in their functional status and demonstrates the ability to make significant improvements in function in a reasonable and predictable amount of time.  ? ?  ?Precautions / Restrictions    ? ?  ? ?Mobility ? Bed Mobility ?Overal bed mobility: Needs Assistance ?Bed Mobility: Supine to Sit ?  ?  ?Supine to sit: Mod assist ?  ?  ?General bed mobility comments: Pt required 2 HHA from therapist to come upright to seated at EOB. ?  ? ?Transfers ?Overall transfer level: Needs assistance ?Equipment used: None ?Transfers: Sit to/from Stand ?Sit to Stand: Min guard ?  ?  ?  ?  ?  ?General transfer comment: Pt able to come upright into standing position without AD, demonstrating good functional strength. ?  ? ?Ambulation/Gait ?Ambulation/Gait assistance: Min guard ?Gait Distance (Feet): 70 Feet ?Assistive device: Rollator (4 wheels) ?Gait Pattern/deviations: Step-through pattern, Trunk flexed ?Gait velocity: decreased ?  ?  ?General Gait Details: Pt ambualtes generally well considering he has not ambulated this far in approximately a year. ? ?Stairs ?  ?  ?  ?  ?  ? ?Wheelchair Mobility ?  ? ?Modified Rankin (Stroke Patients Only) ?  ? ?  ? ?Balance Overall balance assessment: Mild deficits observed, not formally tested ?  ?  ?  ?  ?  ?  ?  ?  ?  ?  ?  ?  ?  ?  ?  ?  ?  ?  ?   ? ? ? ?  Pertinent Vitals/Pain Pain Assessment ?Pain Assessment: 0-10 ?Pain Score: 3  ?Pain Location: R Knee ?Pain Descriptors / Indicators: Aching ?Pain Intervention(s): Limited activity within patient's tolerance, Monitored during session, Repositioned  ? ? ?Home Living Family/patient expects to be discharged to:: Private residence ?Living Arrangements: Spouse/significant other ?Available Help at  Discharge: Family;Available 24 hours/day ?Type of Home: House ?Home Access: Stairs to enter ?Entrance Stairs-Rails: Left ?Entrance Stairs-Number of Steps: 3 ?  ?Home Layout: One level ?Home Equipment: Conservation officer, nature (2 wheels);Cane - quad;Shower seat;Grab bars - toilet;Grab bars - tub/shower;Adaptive equipment;Wheelchair - manual ?   ?  ?Prior Function Prior Level of Function : Independent/Modified Independent ?  ?  ?  ?  ?  ?  ?  ?  ?  ? ? ?Hand Dominance  ? Dominant Hand: Right ? ?  ?Extremity/Trunk Assessment  ? Upper Extremity Assessment ?Upper Extremity Assessment: Overall WFL for tasks assessed ?  ? ?Lower Extremity Assessment ?Lower Extremity Assessment: LLE deficits/detail ?LLE Deficits / Details: Pt demonstrates weakness of the L LE when performing bed-level exercises. ?  ? ?   ?Communication  ? Communication: No difficulties  ?Cognition Arousal/Alertness: Awake/alert ?Behavior During Therapy: [facility] for tasks assessed/performed ?Overall Cognitive Status: Within Functional Limits for tasks assessed ?  ?  ?  ?  ?  ?  ?  ?  ?  ?  ?  ?  ?  ?  ?  ?  ?  ?  ?  ? ?  ?General Comments   ? ?  ?Exercises Total Joint Exercises ?Ankle Circles/Pumps: AROM, Strengthening, Both, 10 reps, Supine ?Quad Sets: AROM, Strengthening, Both, 10 reps, Supine ?Gluteal Sets: AROM, Strengthening, Both, 10 reps, Supine ?Hip ABduction/ADduction: AROM, Strengthening, Both, 10 reps, Supine ?Straight Leg Raises: AROM, Strengthening, Both, 10 reps, Supine ?Long Arc Quad: AROM, Strengthening, Both, 10 reps, Supine  ? ?Assessment/Plan  ?  ?PT Assessment Patient needs continued PT services  ?PT Problem List Decreased strength;Decreased activity tolerance;Decreased balance;Decreased mobility;Decreased knowledge of use of DME;Decreased safety awareness ? ?   ?  ?PT Treatment Interventions DME instruction;Gait training;Functional mobility training;Therapeutic activities;Therapeutic exercise;Balance training   ? ?PT Goals (Current goals can be found  in the Care Plan section)  ?Acute Rehab PT Goals ?Patient Stated Goal: To get stronger and be able to walk more regularly. ?PT Goal Formulation: With patient ?Time For Goal Achievement: 01/13/22 ?Potential to Achieve Goals: Good ? ?  ?Frequency Min 2X/week ?  ? ? ?Co-evaluation   ?  ?  ?  ?  ? ? ?  ?AM-PAC PT "6 Clicks" Mobility  ?Outcome Measure Help needed turning from your back to your side while in a flat bed without using bedrails?: A Lot ?Help needed moving from lying on your back to sitting on the side of a flat bed without using bedrails?: A Lot ?Help needed moving to and from a bed to a chair (including a wheelchair)?: A Little ?Help needed standing up from a chair using your arms (e.g., wheelchair or bedside chair)?: A Little ?Help needed to walk in hospital room?: A Little ?Help needed climbing 3-5 steps with a railing? : A Lot ?6 Click Score: 15 ? ?  ?End of Session Equipment Utilized During Treatment: Gait belt ?Activity Tolerance: Patient tolerated treatment well ?Patient left: in bed;with call bell/phone within reach;with bed alarm set;with family/visitor present ?Nurse Communication: Mobility status ?PT Visit Diagnosis: Unsteadiness on feet (R26.81);Other abnormalities of gait and mobility (R26.89);Muscle weakness (generalized) (M62.81);Difficulty in walking, not elsewhere classified (R26.2) ?  ? ?  Time: 9977-4142 ?PT Time Calculation (min) (ACUTE ONLY): 37 min ? ? ?Charges:   PT Evaluation ?$PT Eval Low Complexity: 1 Low ?PT Treatments ?$Gait Training: 8-22 mins ?$Therapeutic Exercise: 8-22 mins ?  ?   ? ? ?Gwenlyn Saran, PT, DPT ?12/30/21, 3:25 PM ? ? ?Christie Nottingham ?12/30/2021, 3:21 PM ? ?

## 2021-12-30 NOTE — ED Notes (Signed)
Pt transitioned to a hospital bed to promote comfort.  °

## 2021-12-30 NOTE — Progress Notes (Signed)
MD approved patient to take home meds on home schedule. Requested pharmacy to change pt Plavix and Lipitor to 8:00 pm in alignment with home schedule. Today's dose already given.  ?

## 2021-12-31 DIAGNOSIS — G20 Parkinson's disease: Secondary | ICD-10-CM | POA: Diagnosis not present

## 2021-12-31 DIAGNOSIS — I480 Paroxysmal atrial fibrillation: Secondary | ICD-10-CM | POA: Diagnosis not present

## 2021-12-31 DIAGNOSIS — I471 Supraventricular tachycardia: Secondary | ICD-10-CM | POA: Diagnosis not present

## 2021-12-31 LAB — BASIC METABOLIC PANEL
Anion gap: 5 (ref 5–15)
BUN: 22 mg/dL (ref 8–23)
CO2: 29 mmol/L (ref 22–32)
Calcium: 8.4 mg/dL — ABNORMAL LOW (ref 8.9–10.3)
Chloride: 107 mmol/L (ref 98–111)
Creatinine, Ser: 1.13 mg/dL (ref 0.61–1.24)
GFR, Estimated: >60 mL/min (ref >60)
Glucose, Bld: 87 mg/dL (ref 70–99)
Potassium: 4.2 mmol/L (ref 3.5–5.1)
Sodium: 141 mmol/L (ref 135–145)

## 2021-12-31 LAB — URINE CULTURE: Culture: NO GROWTH

## 2021-12-31 LAB — ECHOCARDIOGRAM COMPLETE
AR max vel: 2.59 cm2
AV Area VTI: 2.4 cm2
AV Area mean vel: 2.69 cm2
AV Mean grad: 2 mmHg
AV Peak grad: 4.7 mmHg
Ao pk vel: 1.09 m/s
Area-P 1/2: 3.06 cm2
Height: 70 in
MV VTI: 1.49 cm2
S' Lateral: 3.1 cm
Weight: 3696 oz

## 2021-12-31 LAB — APTT: aPTT: 92 seconds — ABNORMAL HIGH (ref 24–36)

## 2021-12-31 LAB — CBC
HCT: 31.6 % — ABNORMAL LOW (ref 39.0–52.0)
Hemoglobin: 10.1 g/dL — ABNORMAL LOW (ref 13.0–17.0)
MCH: 34 pg (ref 26.0–34.0)
MCHC: 32 g/dL (ref 30.0–36.0)
MCV: 106.4 fL — ABNORMAL HIGH (ref 80.0–100.0)
Platelets: 250 10*3/uL (ref 150–400)
RBC: 2.97 MIL/uL — ABNORMAL LOW (ref 4.22–5.81)
RDW: 13.4 % (ref 11.5–15.5)
WBC: 6.7 10*3/uL (ref 4.0–10.5)
nRBC: 0 % (ref 0.0–0.2)

## 2021-12-31 LAB — HEPARIN LEVEL (UNFRACTIONATED): Heparin Unfractionated: >1.1 IU/mL — ABNORMAL HIGH (ref 0.30–0.70)

## 2021-12-31 MED ORDER — LEVOTHYROXINE SODIUM 50 MCG PO TABS: 50.0000 ug | ORAL_TABLET | Freq: Every day | ORAL | 0 refills | Status: AC

## 2021-12-31 MED ORDER — APIXABAN 5 MG PO TABS
5.0000 mg | ORAL_TABLET | Freq: Two times a day (BID) | ORAL | Status: DC
  Administered 2021-12-31: 5 mg via ORAL
  Filled 2021-12-31: qty 1

## 2021-12-31 NOTE — Plan of Care (Signed)

## 2021-12-31 NOTE — Discharge Summary (Signed)
Physician Discharge Summary  ?Francisco [name] IRJ:[id number] DOB: 02-Aug-1946 DOA: 12/29/2021 ? ?PCP: Adin Hector, MD ? ?Admit date: 12/29/2021 ?Discharge date: 12/31/2021 ? ?Admitted From: home  ?Disposition:  home ? ?Recommendations for Outpatient Follow-up:  ?Follow up with PCP in 1-2 weeks ?F/u w/ cardio, Dr. Saralyn Pilar, in 1 week ?Will need repeat TSH in 4-6 weeks ? ?Home Health: no as pt refused but agreed to outpatient therapy  ?Equipment/Devices: ? ?Discharge Condition: stable  ?CODE STATUS: full  ?Diet recommendation: Heart Healthy  ? ?Brief/Interim Summary: ?HPI was taken from Dr. Sidney Ace: ?Francisco [name] is a 76 y.o. Caucasian male with medical history significant for multiple medical problems that are mentioned below, who presented to the emergency room with acute onset of midsternal chest pain felt as pressure with associated palpitations with radiation to his left arm as well as associated dyspnea.  The patient was noted to be in SVT with a rate of 180s which has responded to IV adenosine given by EMS I converted him to sinus tachycardia.  He admitted to nausea without vomiting.  No cough or wheezing.  No dysuria, oliguria or hematuria or flank pain. ? ?ED Course: When the patient came to the ER vital signs were within normal.  Labs revealed a BUN of 26 and creatinine 1.43 close to baseline.  High-sensitivity troponin was 36 and later 173.  CBC showed anemia worse than previous levels with hemoglobin 11.2 and 34.8.  TSH was 0.228 and free T41.24.  UA was unremarkable.   ?  ?Portable chest ray showed stable elevation of the right hemidiaphragm with no acute cardiopulmonary disease . ?EKG as reviewed by me : .  EKG showedSinus tachycardia with rate of 101 with low voltage QRS and poor R wave progression. ? ?The patient was given 40 aspirin ER.  He will be admitted to an observation cardiac telemetry bed for further evaluation and management. ? ? ?As per Dr. Jimmye Norman 4/20-4/21/23: Pt was found to be  in SVT by EMS and was given IV adenosine which converted him back to sinus tachycardia. No further SVT was noted while in the hospital. Of note, troponins were elevated and thought to be secondary to demand ischemia from SVT. Finally, pt was found to have 0.228 so the levothyroxine dose was decreased to 65mg daily and pt will need a repeat TSH in 4-6 weeks. Pt verbalized his understanding.  ? ?Discharge Diagnoses:  ?Principal Problem: ?  SVT (supraventricular tachycardia) ([facility] ?Active Problems: ?  Elevated troponin ?  Myasthenia gravis ([place] ?  Paroxysmal atrial fibrillation ([facility]) ?  Hypothyroidism ?  Parkinson's disease ([place] ? ? ?SVT: continue on metoprolol  ? ?Elevated troponin: likely secondary to demand ischemia from SVT. Continue on tele. NSTEMI r/o. Cardio recs apprec  ? ?Parkinson's disease: continue on home dose of sinemet  ? ?Hypothyroidism: continue on reduced dose of levothyroxine. Will need repeat TSH in 4-6 weeks  ? ?PTKZ:SWFUXNATon eliquis. Metoprolol prn  ? ?Myasthenia gravis: continue on home dose of imuran  ? ?Gross hematuria: intermittent. Etiology unclear, UTI vs kidney stone.  Urine cx shows no growth. Resolved  ? ?Macrocytic anemia: H&H are trending up today  ? ?Obesity: BMI 33.1. Complicates overall care & prognosis ? ?Generalized weakness: PT/OT recs HH but refused but agrees to outpatient therapy  ? ?Discharge Instructions ? ?Discharge Instructions   ? ? Diet - low sodium heart healthy   Complete by: As directed ?  ? Discharge instructions   Complete by: As  directed ?  ? F/u w/ PCP in 1-2 weeks. Will need a repeat TSH level in 4-6 weeks as levothyroxine/synthroid dose was reduced to 77mg. F/u w/ cardio, Dr. PSaralyn Pilar in 1 week  ? Increase activity slowly   Complete by: As directed ?  ? ?  ? ?Allergies as of 12/31/2021   ? ?   Reactions  ? Azithromycin Other (See Comments)  ? Avoid due to myasthenia gravis  ? Codeine Nausea And Vomiting  ? ?  ? ?  ?Medication List  ?  ? ?STOP taking these  medications   ? ?furosemide 20 MG tablet ?Commonly known as: LASIX ?  ?gabapentin 300 MG capsule ?Commonly known as: NEURONTIN ?  ?ibuprofen 200 MG tablet ?Commonly known as: ADVIL ?  ?Magnesium 500 MG Tabs ?  ?pantoprazole 40 MG tablet ?Commonly known as: PROTONIX ?  ?VITAMIN D3 PO ?  ? ?  ? ?TAKE these medications   ? ?amoxicillin-clavulanate 500-125 MG tablet ?Commonly known as: AUGMENTIN ?Take 1 tablet by mouth every 12 (twelve) hours. ?  ?apixaban 5 MG Tabs tablet ?Commonly known as: ELIQUIS ?Take 1 tablet (5 mg total) by mouth 2 (two) times daily. Restart 05/08/2021 ?  ?atorvastatin 10 MG tablet ?Commonly known as: LIPITOR ?TAKE 1 TABLET(10 MG) BY MOUTH DAILY ?  ?azaTHIOprine 50 MG tablet ?Commonly known as: IMURAN ?Take 150 mg by mouth daily. ?  ?carbidopa-levodopa 25-100 MG tablet ?Commonly known as: SINEMET IR ?Take 1 tablet by mouth 3 (three) times daily. ?  ?clopidogrel 75 MG tablet ?Commonly known as: PLAVIX ?Take 1 tablet (75 mg total) by mouth daily at 6 (six) AM. Restart 05/08/2021 ?  ?desmopressin 0.2 MG tablet ?Commonly known as: DDAVP ?Take 200 mcg by mouth at bedtime. ?  ?HYDROcodone-acetaminophen 10-325 MG tablet ?Commonly known as: NORCO ?Take 1-2 tablets by mouth See admin instructions. Take 1 to 2 tablets every morning, may take 1 tablet every 6 hours as needed for pain ?  ?ipratropium-albuterol 0.5-2.5 (3) MG/3ML Soln ?Commonly known as: DUONEB ?Take 3 mLs by nebulization every 4 (four) hours as needed. ?What changed: Another medication with the same name was removed. Continue taking this medication, and follow the directions you see here. ?  ?levothyroxine 50 MCG tablet ?Commonly known as: SYNTHROID ?Take 1 tablet (50 mcg total) by mouth daily at 6 (six) AM. ?Start taking on: January 01, 2022 ?What changed:  ?medication strength ?how much to take ?when to take this ?Another medication with the same name was removed. Continue taking this medication, and follow the directions you see here. ?   ?metoprolol tartrate 25 MG tablet ?Commonly known as: LOPRESSOR ?Take 12.5 mg by mouth 2 (two) times daily as needed. For racing heart. ?  ?predniSONE 20 MG tablet ?Commonly known as: DELTASONE ?Take 20 mg by mouth every other day. Taking 20 mg Monday, Wed., and Friday ?  ?spironolactone 25 MG tablet ?Commonly known as: ALDACTONE ?Take 25 mg by mouth daily. ?  ?torsemide 20 MG tablet ?Commonly known as: DEMADEX ?Take 20 mg by mouth daily. ?  ?traZODone 50 MG tablet ?Commonly known as: DESYREL ?Take 0.5 tablets (25 mg total) by mouth at bedtime as needed for sleep. ?What changed:  ?how much to take ?when to take this ?  ?vitamin B-12 1000 MCG tablet ?Commonly known as: CYANOCOBALAMIN ?Take 1,000 mcg by mouth daily. ?  ? ?  ? ? ?Allergies  ?Allergen Reactions  ? Azithromycin Other (See Comments)  ?  Avoid due to myasthenia gravis  ?  Codeine Nausea And Vomiting  ? ? ?Consultations: ?Cardio  ? ? ?Procedures/Studies: ?DG Chest 1 View ? ?Result Date: 12/29/2021 ?CLINICAL DATA:  Supraventricular tachycardia EXAM: CHEST  1 VIEW COMPARISON:  08/28/2021 FINDINGS: Stable elevation of the right hemidiaphragm with right basilar atelectasis or scar. The lungs are otherwise clear. No pneumothorax or pleural effusion. Cardiac size within normal limits. No acute bone abnormality. IMPRESSION: Stable elevation of the right hemidiaphragm. No radiographic evidence of acute cardiopulmonary disease. Electronically Signed   By: Fidela Salisbury M.D.   On: 12/29/2021 23:33  ? ?ECHOCARDIOGRAM COMPLETE ? ?Result Date: 12/31/2021 ?   ECHOCARDIOGRAM REPORT   Patient Name:   Francisco [name] Date of Exam: 12/30/2021 Medical Rec #:  [id number]          Height:       70.0 in Accession #:    [id number]         Weight:       231.0 lb Date of Birth:  1945-11-12          BSA:          2.219 m? Patient Age:    31 years           BP:           124/63 mmHg Patient Gender: M                  HR:           72 bpm. Exam Location:  [facility] Procedure: 2D Echo,  Cardiac Doppler and Color Doppler Indications:     Elevated Troponin  History:         Patient has prior history of Echocardiogram examinations, most                  recent 08/05/2020. CHF, COPD; Risk Factors:H

## 2021-12-31 NOTE — Progress Notes (Signed)
Physical Therapy Treatment ?Patient Details ?Name: Francisco [name] ?MRN: [id number] ?DOB: 1946-04-06 ?Today's Date: 12/31/2021 ? ? ?History of Present Illness Francisco [name] is a 76 y.o. male with history of hypertension, CHF, CKD, COPD, paroxysmal atrial fibrillation, hypothyroidism, myasthenia gravis who presents to the emergency department with EMS for palpitations, shortness of breath, generalized weakness, hypotension and tachycardia at home. ? ?  ?PT Comments  ? ? Pt has made significant functional improvement as of this date. Wife present for session. Pt demonstrated ability to transfer in and out of bed, transfer from multiple surfaces, and complete 277f of ambulation with use of personal rollator. Pt and wife feel comfortable with returning home once medically cleared.  [facility]  ?  ?Recommendations for follow up therapy are one component of a multi-disciplinary discharge planning process, led by the attending physician.  Recommendations may be updated based on patient status, additional functional criteria and insurance authorization. ? ?Follow Up Recommendations ? Outpatient PT ?  ?  ?Assistance Recommended at Discharge PRN  ?Patient can return home with the following A little help with walking and/or transfers;A little help with bathing/dressing/bathroom ?  ?Equipment Recommendations ? None recommended by PT  ?  ?Recommendations for Other Services   ? ? ?  ?Precautions / Restrictions Precautions ?Precautions: Fall ?Precaution Comments: Decreased active L ankle dorsiflexion from last spinal surgery. ?Restrictions ?Weight Bearing Restrictions: No  ?  ? ?Mobility ? Bed Mobility ?Overal bed mobility: Needs Assistance ?Bed Mobility: Supine to Sit, Sit to Supine ?  ?  ?Supine to sit: Modified independent (Device/Increase time), HOB elevated ?  ?  ?  ?  ? ?Transfers ?Overall transfer level: Needs assistance ?Equipment used: Rolling walker (2  wheels) ?Transfers: Sit to/from Stand ?Sit to Stand: Modified independent (Device/Increase time) ?  ?  ?  ?  ?  ?General transfer comment: Modified Independent transfers in bathroom ?  ? ?Ambulation/Gait ?Ambulation/Gait assistance: Supervision ?Gait Distance (Feet): 200 Feet ?Assistive device: Rollator (4 wheels) ?Gait Pattern/deviations: Step-through pattern, Trunk flexed ?Gait velocity: decreased ?  ?  ?General Gait Details: Pt ambualtes generally well considering he has not ambulated this far in approximately a year. ? ? ?Stairs ?  ?  ?  ?  ?  ? ? ?Wheelchair Mobility ?  ? ?Modified Rankin (Stroke Patients Only) ?  ? ? ?  ?Balance   ?  ?  ?  ?  ?  ?  ?  ?  ?  ?  ?  ?  ?  ?  ?  ?  ?  ?  ?  ? ?  ?Cognition Arousal/Alertness: Awake/alert ?Behavior [facility]: [facility]/performed ?Overall Cognitive Status: Within Functional Limits for tasks assessed ?  ?  ?  ?  ?  ?  ?  ?  ?  ?  ?  ?  ?  ?  ?  ?  ?  ?  ?  ? ?  ?Exercises   ? ?  ?General Comments General comments (skin integrity, edema, etc.): Discussed d/c plans. importance of staying focused while ambulating until out-pt PT can address L lower leg weakness ?  ?  ? ?Pertinent Vitals/Pain Pain Assessment ?Pain Assessment: No/denies pain  ? ? ?Home Living Family/patient expects to be discharged to:: Private residence ?Living Arrangements: Spouse/significant other ?Available Help at Discharge: Family;Available 24 hours/day ?Type of Home: House ?Home Access: Stairs to enter ?Entrance Stairs-Rails: Left ?Entrance Stairs-Number of  Steps: 3 ?  ?Home Layout: One level ?Home Equipment: Conservation officer, nature (2 wheels);Cane - quad;Shower seat;Grab bars - toilet;Grab bars - tub/shower;Adaptive equipment;Wheelchair - manual ?   ?  ?Prior Function    ?  ?  ?   ? ?PT Goals (current goals can now be found in the care plan section) Acute Rehab PT Goals ?Patient Stated Goal: To get stronger and be able to walk more regularly. ?Progress towards PT goals: Progressing toward  goals ? ?  ?Frequency ? ? ? Min 2X/week ? ? ? ?  ?PT Plan Current plan remains appropriate  ? ? ?Co-evaluation   ?  ?  ?  ?  ? ?  ?AM-PAC PT "6 Clicks" Mobility   ?Outcome Measure ? Help needed turning from your back to your side while in a flat bed without using bedrails?: A Little ?Help needed moving from lying on your back to sitting on the side of a flat bed without using bedrails?: A Little ?Help needed moving to and from a bed to a chair (including a wheelchair)?: A Little ?Help needed standing up from a chair using your arms (e.g., wheelchair or bedside chair)?: A Little ?Help needed to walk in hospital room?: A Little ?Help needed climbing 3-5 steps with a railing? : A Little ?6 Click Score: 18 ? ?  ?End of Session Equipment Utilized During Treatment: Gait belt ?Activity Tolerance: Patient tolerated treatment well ?Patient left: in chair;with call bell/phone within reach;with family/visitor present ?Nurse Communication: Mobility status;Other (comment) (pt had a BM while utilizing bathroom) ?PT Visit Diagnosis: Unsteadiness on feet (R26.81);Other abnormalities of gait and mobility (R26.89);Muscle weakness (generalized) (M62.81);Difficulty in walking, not elsewhere classified (R26.2) ?  ? ? ?Time: 6237-6283 ?PT Time Calculation (min) (ACUTE ONLY): 32 min ? ?Charges:  $Gait Training: 8-22 mins ?$Therapeutic Activity: 8-22 mins          ?          ?Mikel Cella, PTA ? ? ? ?Josie Dixon ?12/31/2021, 1:34 PM ? ?

## 2021-12-31 NOTE — Consult Note (Signed)
ANTICOAGULATION CONSULT NOTE - Initial Consult ? ?Pharmacy Consult for Heparin ?Indication: chest pain/ACS ? ?Allergies  ?Allergen Reactions  ? Azithromycin Other (See Comments)  ?  Avoid due to myasthenia gravis  ? Codeine Nausea And Vomiting  ? ? ?Patient Measurements: ?Height: '5\' 10"'$  (177.8 cm) ?Weight: 104.8 kg (231 lb) ?IBW/kg (Calculated) : 73 ?Heparin Dosing Weight: 95.3 kg ? ?Vital Signs: ?Temp: 98.1 ?F (36.7 ?C) (04/21 0502) ?BP: 136/70 (04/21 0502) ?Pulse Rate: 72 (04/21 0502) ? ?Labs: ?Recent Labs  ?  12/29/21 ?2244 12/30/21 ?0040 12/30/21 ?0450 12/30/21 ?3329 12/30/21 ?5188 12/30/21 ?1243 12/30/21 ?1816 12/31/21 ?0310  ?HGB 11.2*  --  9.8*  --   --   --   --  10.1*  ?HCT 34.8*  --  31.2*  --   --   --   --  31.6*  ?PLT 303  --  264  --   --   --   --  250  ?APTT  --   --   --  28  --   --  87* 92*  ?LABPROT  --   --   --  15.3*  --   --   --   --   ?INR  --   --   --  1.2  --   --   --   --   ?HEPARINUNFRC  --   --   --  >1.10*  --   --   --  >1.10*  ?CREATININE 1.43*  --  1.45*  --   --   --   --  1.13  ?TROPONINIHS 36*   < >  --   --  395* 347* 314*  --   ? < > = values in this interval not displayed.  ? ? ? ?Estimated Creatinine Clearance: 66.4 mL/min (by C-G formula based on SCr of 1.13 mg/dL). ? ? ?Medical History: ?Past Medical History:  ?Diagnosis Date  ? Arthritis   ? lower left hip  ? Atypical angina ([place])   ? Bilateral hand numbness   ? from back surgery  ? Bronchitis, chronic ([place])   ? Cancer [facility])   ? 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 Carotid stenosis   ? CHF (congestive heart failure) ([place])   ? CKD (chronic kidney disease)   ? CKD (chronic kidney disease) stage 3, GFR 30-59 ml/min (HCC)   ? COPD (chronic obstructive pulmonary disease) ([place])   ? stage 2  ? DDD (degenerative disc disease), cervical   ? Dysrhythmia   ? post carotid stent bradycardia; PAF 09/2020  ? GERD (gastroesophageal reflux disease)   ? Hypercholesterolemia   ?  Hypertension   ? Hypothyroidism   ? pt takes Levothyroxine daily  ? Lumbosacral spinal stenosis   ? Myasthenia gravis, adult form ([place])   ? PAD (peripheral artery disease) ([place])   ? Shortness of breath   ? Lung MD- Dr Darlin Coco  ? Sleep apnea   ? do not use CPAP every night  ? ? ?Medications:  ?Eliquis '5mg'$  BID PTA  ? ?Assessment: ?Pharmacy has been consulted to initiate and monitor heparin infusion in 76yo male with history of paroxysmal atrial fibrillation admitted with tachycardia. Initial troponin levels of 36 and 173. Patient's last dose of Eliquis was taken~2000 on 12/29/21. Baseline labs are ordered and pending. ? ?Goal of Therapy:  ?Heparin level 0.3-0.7 units/ml ?aPTT 66-102 seconds ?Monitor platelets by anticoagulation protocol:  Yes ?  ?Plan:  ?4/20'@1816'$ : aPTT 87 sec, therapeutic x 1 ?4/21'@0310'$ : aPTT 92s, therapeutic x 2, HL >1.10 ? ?Continue heparin infusion at 1300 units/hr ?Check aPTT and HL with AM labs while on heparin until both levels correlate, then will transition to HL dosing only. ?Continue to monitor H&H and platelets ? ?Renda Rolls, PharmD, MBA ?12/31/2021 ?5:35 AM ? ? ? ?

## 2021-12-31 NOTE — Progress Notes (Signed)
? ? ? ?  [place] ?REHABILITATION SERVICES REFERRAL ? ?     ? ?Occupational Therapy * Physical Therapy * Speech Therapy ?       ? ? ?            ?     DATE 12/31/2021 ? ?PATIENT NAME Francisco Ketchum. [name] PATIENT MRN [id number] ? ?     DIAGNOSIS/DIAGNOSIS CODE SVT (supraventricular tachycardia) I47.1 ? DATE OF DISCHARGE: Unknown ? ?     PRIMARY CARE PHYSICIAN Ramonita Lab III  PCP PHONE/FAX P [phone] ?  ? ? Dear Provider (Name: [facility] outpatient __  Fax: [phone] ?  ?I certify that I have examined this patient and that occupational/physical/speech therapy is necessary on an outpatient basis.   ? ?The patient has expressed interest in completing their recommended course of therapy at your  ?location.  Once a formal order from the patient's primary care physician has been obtained, please  contact him/her to schedule an appointment for evaluation at your earliest convenience. ? ? ?[x ]  Physical Therapy Evaluate and Treat ? ?        [  ]  Occupational Therapy Evaluate and Treat ? ?          [  ]  Speech Therapy Evaluate and Treat ? ? ? ? ? ? ? ? ?The patient's primary care physician (listed above) must furnish and be responsible for a formal order such that the recommended services may be furnished while under the primary physician's care, and that the plan of care will be established and reviewed every 30 days (or more often if condition necessitates). ? ? ?

## 2021-12-31 NOTE — Evaluation (Signed)
Occupational Therapy Evaluation ?Patient Details ?Name: Francisco [name] ?MRN: [id number] ?DOB: 1945-09-28 ?Today's Date: 12/31/2021 ? ? ?History of Present Illness Francisco [name] is a 76 y.o. male with history of hypertension, CHF, CKD, COPD, paroxysmal atrial fibrillation, hypothyroidism, myasthenia gravis who presents to the emergency department with EMS for palpitations, shortness of breath, generalized weakness, hypotension and tachycardia at home.  ? ?Clinical Impression ?  ?[name] presents today with generalized weakness, limited endurance, and impaired balance. During today's OT evaluation, he is able to engage in bed mobility, transfers, grooming, dressing, ambulation, with SUPV for safety. He reports functioning at his baseline level in terms of ADL performance and states that at home he has no difficulties with bed mobility, toileting, showering, and dressing, beyond occasion need for assistance with donning socks, which his wife supports. He has an array of DME and AE at home to meet his needs. Pt and therapist in agreement that pt does not need additional OT at this time, although pt reports he is eager to continue with outpatient PT services through [facility], which he believes have allowed him to greatly improve his LE strength and ability to ambulate INDly.   ? ?Recommendations for follow up therapy are one component of a multi-disciplinary discharge planning process, led by the attending physician.  Recommendations may be updated based on patient status, additional functional criteria and insurance authorization.  ? ?Follow Up Recommendations ? No OT follow up  ?  ?Assistance Recommended at Discharge Intermittent Supervision/Assistance  ?Patient can return home with the following   ? ?  ?Functional Status Assessment ? Patient has had a recent decline in their functional status and demonstrates the ability to make significant improvements in function in a reasonable and predictable amount of  time.  ?Equipment Recommendations ? None recommended by OT  ?  ?Recommendations for Other Services   ? ? ?  ?Precautions / Restrictions Precautions ?Precautions: Fall ?Precaution Comments: Mod fall risk ?Restrictions ?Weight Bearing Restrictions: No  ? ?  ? ?Mobility Bed Mobility ?Overal bed mobility: Needs Assistance ?Bed Mobility: Supine to Sit, Sit to Supine ?  ?  ?Supine to sit: Min assist ?Sit to supine: Modified independent (Device/Increase time) ?  ?General bed mobility comments: 1 HHA, heavy use of bed rails ?  ? ?Transfers ?Overall transfer level: Needs assistance ?Equipment used: Rolling walker (2 wheels) ?Transfers: Sit to/from Stand ?Sit to Stand: Modified independent (Device/Increase time) ?  ?  ?  ?  ?  ?General transfer comment: Multiple sit<>stand w/ RW, good technique, quick and fluid movements, no LOB ?  ? ?  ?Balance Overall balance assessment: Needs assistance ?Sitting-balance support: No upper extremity supported ?Sitting balance-Leahy Scale: Normal ?  ?  ?Standing balance support: Bilateral upper extremity supported, During functional activity ?Standing balance-Leahy Scale: Good ?Standing balance comment: Reports LE fatiguing quickly with extended standing ?  ?  ?  ?  ?  ?  ?  ?  ?  ?  ?  ?   ? ?ADL either performed or assessed with clinical judgement  ? ?ADL Overall ADL's : Needs assistance/impaired ?  ?  ?  ?  ?  ?  ?  ?  ?  ?  ?  ?  ?  ?  ?  ?  ?  ?  ?Functional mobility during ADLs: Supervision/safety;Cueing for safety ?   ? ? ? ?Vision Patient Visual Report: No change from baseline ?   ?   ?Perception   ?  ?  Praxis   ?  ? ?Pertinent Vitals/Pain Pain Assessment ?Pain Assessment: No/denies pain  ? ? ? ?Hand Dominance Right ?  ?Extremity/Trunk Assessment Upper Extremity Assessment ?Upper Extremity Assessment: Overall WFL for tasks assessed ?  ?Lower Extremity Assessment ?Lower Extremity Assessment: LLE deficits/detail;Generalized weakness ?LLE Deficits / Details: Overall weakness in LE, more  pronounced on L ?  ?Cervical / Trunk Assessment ?Cervical / Trunk Assessment: Normal ?  ?Communication Communication ?Communication: No difficulties ?  ?Cognition Arousal/Alertness: Awake/alert ?Behavior During Therapy: [facility] for tasks assessed/performed ?Overall Cognitive Status: Within Functional Limits for tasks assessed ?  ?  ?  ?  ?  ?  ?  ?  ?  ?  ?  ?  ?  ?  ?  ?  ?  ?  ?  ?General Comments    ? ?  ?Exercises Other Exercises ?Other Exercises: Educ re: falls prevention, HEP, ECS ?  ?Shoulder Instructions    ? ? ?Home Living Family/patient expects to be discharged to:: Private residence ?Living Arrangements: Spouse/significant other ?Available Help at Discharge: Family;Available 24 hours/day ?Type of Home: House ?Home Access: Stairs to enter ?Entrance Stairs-Number of Steps: 3 ?Entrance Stairs-Rails: Left ?Home Layout: One level ?  ?  ?Bathroom Shower/Tub: Walk-in shower ?  ?Bathroom Toilet: Handicapped height ?  ?  ?Home Equipment: Conservation officer, nature (2 wheels);Cane - quad;Shower seat;Grab bars - toilet;Grab bars - tub/shower;Adaptive equipment;Wheelchair - manual ?Adaptive Equipment: Reacher;Sock aid;Long-handled shoe horn ?  ?  ? ?  ?Prior Functioning/Environment Prior Level of Function : Independent/Modified Independent ?  ?  ?  ?  ?  ?  ?Mobility Comments: Uses RW, rollator, WC, or quad cane for ambulation ?  ?  ? ?  ?  ?OT Problem List: Decreased strength;Decreased activity tolerance;Impaired balance (sitting and/or standing) ?  ?   ?OT Treatment/Interventions:    ?  ?OT Goals(Current goals can be found in the care plan section) Acute Rehab OT Goals ?Patient Stated Goal: to be able to walk again ?OT Goal Formulation: With patient ?Time For Goal Achievement: 01/14/22 ?Potential to Achieve Goals: Good  ?OT Frequency:   ?  ? ?Co-evaluation   ?  ?  ?  ?  ? ?  ?AM-PAC OT "6 Clicks" Daily Activity     ?Outcome Measure Help from another person eating meals?: None ?Help from another person taking care of personal  grooming?: None ?Help from another person toileting, which includes using toliet, bedpan, or urinal?: None ?Help from another person bathing (including washing, rinsing, drying)?: None ?Help from another person to put on and taking off regular upper body clothing?: None ?Help from another person to put on and taking off regular lower body clothing?: A Little ?6 Click Score: 23 ?  ?End of Session Equipment Utilized During Treatment: Rolling walker (2 wheels) ? ?Activity Tolerance: Patient tolerated treatment well ?Patient left: in bed;with call bell/phone within reach;with family/visitor present ? ?OT Visit Diagnosis: Unsteadiness on feet (R26.81);Muscle weakness (generalized) (M62.81)  ?              ?Time: 2229-7989 ?OT Time Calculation (min): 17 min ?Charges:  OT General Charges ?$OT Visit: 1 Visit ?OT Evaluation ?$OT Eval Low Complexity: 1 Low ?OT Treatments ?$Self Care/Home Management : 8-22 mins ?Josiah Lobo, PhD, MS, OTR/L ?12/31/21, 10:24 AM ? ?

## 2021-12-31 NOTE — Progress Notes (Signed)
[facility] ? ? ? ?SUBJECTIVE: Patient feels much improved no palpitations or tachycardia no chest pain has ambulated in the halls well and feels well enough to go home ? ? ?Vitals:  ? 12/30/21 2315 12/31/21 0502 12/31/21 0818 12/31/21 1123  ?BP: 137/62 136/70 (!) 128/57 91/61  ?Pulse: 77 72 76 64  ?Resp: 20 (!) '23 16 16  '$ ?Temp: 98.5 ?F (36.9 ?C) 98.1 ?F (36.7 ?C) 98.6 ?F (37 ?C) 98.2 ?F (36.8 ?C)  ?TempSrc:      ?SpO2: 96% 96% 94% (!) 89%  ?Weight:      ?Height:      ? ? ? ?Intake/Output Summary (Last 24 hours) at 12/31/2021 1151 ?Last data filed at 12/31/2021 1029 ?Gross per 24 hour  ?Intake 480 ml  ?Output 2700 ml  ?Net -2220 ml  ? ? ? ? ?PHYSICAL EXAM ? ?General: Well developed, well nourished, in no acute distress ?HEENT:  Normocephalic and atramatic ?Neck:  No JVD.  ?Lungs: Clear bilaterally to auscultation and percussion. ?Heart: HRRR . Normal S1 and S2 without gallops or murmurs.  ?Abdomen: Bowel sounds are positive, abdomen soft and non-tender  ?Msk:  Back normal, normal gait. Normal strength and tone for age. ?Extremities: No clubbing, cyanosis or edema.   ?Neuro: Alert and oriented X 3. ?Psych:  Good affect, responds appropriately ? ? ?LABS: ?Basic Metabolic Panel: ?Recent Labs  ?  12/29/21 ?2244 12/30/21 ?0450 12/31/21 ?0310  ?NA 139 138 141  ?K 4.5 4.2 4.2  ?CL 101 103 107  ?CO2 '28 30 29  '$ ?GLUCOSE 129* 102* 87  ?BUN 26* 28* 22  ?CREATININE 1.43* 1.45* 1.13  ?CALCIUM 8.8* 8.3* 8.4*  ?MG 2.3  --   --   ? ?Liver Function Tests: ?No results for input(s): AST, ALT, ALKPHOS, BILITOT, PROT, ALBUMIN in the last 72 hours. ?No results for input(s): LIPASE, AMYLASE in the last 72 hours. ?CBC: ?Recent Labs  ?  12/30/21 ?0450 12/31/21 ?0310  ?WBC 7.9 6.7  ?HGB 9.8* 10.1*  ?HCT 31.2* 31.6*  ?MCV 106.5* 106.4*  ?PLT 264 250  ? ?Cardiac Enzymes: ?No results for input(s): CKTOTAL, CKMB, CKMBINDEX, TROPONINI in the last 72 hours. ?BNP: ?Invalid input(s): POCBNP ?D-Dimer: ?No results for input(s): DDIMER in the last 72  hours. ?Hemoglobin A1C: ?No results for input(s): HGBA1C in the last 72 hours. ?Fasting Lipid Panel: ?No results for input(s): CHOL, HDL, LDLCALC, TRIG, CHOLHDL, LDLDIRECT in the last 72 hours. ?Thyroid Function Tests: ?Recent Labs  ?  12/29/21 ?2244  ?TSH 0.228*  ? ?Anemia Panel: ?No results for input(s): VITAMINB12, FOLATE, FERRITIN, TIBC, IRON, RETICCTPCT in the last 72 hours. ? ?DG Chest 1 View ? ?Result Date: 12/29/2021 ?CLINICAL DATA:  Supraventricular tachycardia EXAM: CHEST  1 VIEW COMPARISON:  08/28/2021 FINDINGS: Stable elevation of the right hemidiaphragm with right basilar atelectasis or scar. The lungs are otherwise clear. No pneumothorax or pleural effusion. Cardiac size within normal limits. No acute bone abnormality. IMPRESSION: Stable elevation of the right hemidiaphragm. No radiographic evidence of acute cardiopulmonary disease. Electronically Signed   By: Fidela Salisbury M.D.   On: 12/29/2021 23:33  ? ?ECHOCARDIOGRAM COMPLETE ? ?Result Date: 12/31/2021 ?   ECHOCARDIOGRAM REPORT   Patient Name:   Francisco [name] Date of Exam: 12/30/2021 Medical Rec #:  [id number]          Height:       70.0 in Accession #:    [id number]         Weight:  231.0 lb Date of Birth:  [birth year]          BSA:          2.219 m? Patient Age:    76 years           BP:           124/63 mmHg Patient Gender: M                  HR:           72 bpm. Exam Location:  [facility] Procedure: 2D Echo, Cardiac Doppler and Color Doppler Indications:     Elevated Troponin  History:         Patient has prior history of Echocardiogram examinations, most                  recent 08/05/2020. CHF, COPD; Risk Factors:Hypertension.  Sonographer:     Sherrie Sport Referring Phys:  [id number] Dexter TANG Diagnosing Phys: Yolonda Kida MD  Sonographer Comments: Suboptimal apical window and no subcostal window. IMPRESSIONS  1. Left ventricular ejection fraction, by estimation, is 50 to 55%. The left ventricle has low normal function. The left  ventricle has no regional wall motion abnormalities. There is mild left ventricular hypertrophy. Left ventricular diastolic parameters were normal.  2. Right ventricular systolic function is normal. The right ventricular size is mildly enlarged. Mildly increased right ventricular wall thickness.  3. The mitral valve is normal in structure. Mild mitral valve regurgitation.  4. The aortic valve is grossly normal. Aortic valve regurgitation is not visualized. FINDINGS  Left Ventricle: Left ventricular ejection fraction, by estimation, is 50 to 55%. The left ventricle has low normal function. The left ventricle has no regional wall motion abnormalities. The left ventricular internal cavity size was normal in size. There is mild left ventricular hypertrophy. Left ventricular diastolic parameters were normal. Right Ventricle: The right ventricular size is mildly enlarged. Mildly increased right ventricular wall thickness. Right ventricular systolic function is normal. Left Atrium: Left atrial size was normal in size. Right Atrium: Right atrial size was normal in size. Pericardium: There is no evidence of pericardial effusion. Mitral Valve: The mitral valve is normal in structure. Mild mitral valve regurgitation. MV peak gradient, 4.7 mmHg. The mean mitral valve gradient is 2.0 mmHg. Tricuspid Valve: The tricuspid valve is grossly normal. Tricuspid valve regurgitation is mild. Aortic Valve: The aortic valve is grossly normal. Aortic valve regurgitation is not visualized. Aortic valve mean gradient measures 2.0 mmHg. Aortic valve peak gradient measures 4.7 mmHg. Aortic valve area, by VTI measures 2.40 cm?. Pulmonic Valve: The pulmonic valve was grossly normal. Pulmonic valve regurgitation is not visualized. Aorta: The ascending aorta was not well visualized. IAS/Shunts: No atrial level shunt detected by color flow Doppler.  LEFT VENTRICLE PLAX 2D LVIDd:         4.30 cm   Diastology LVIDs:         3.10 cm   LV e' medial:     6.74 cm/s LV PW:         1.30 cm   LV E/e' medial:  12.0 LV IVS:        1.20 cm   LV e' lateral:   7.51 cm/s LVOT diam:     2.00 cm   LV E/e' lateral: 10.7 LV SV:         39 LV SV Index:   18 LVOT Area:     3.14 cm?  RIGHT VENTRICLE  RV Basal diam:  3.60 cm LEFT ATRIUM             Index        RIGHT ATRIUM           Index LA diam:        3.70 cm 1.67 cm/m?   RA Area:     19.90 cm? LA Vol (A2C):   70.5 ml 31.77 ml/m?  RA Volume:   54.90 ml  24.74 ml/m? LA Vol (A4C):   52.0 ml 23.43 ml/m? LA Biplane Vol: 61.7 ml 27.80 ml/m?  AORTIC VALVE                    PULMONIC VALVE AV Area (Vmax):    2.59 cm?     PV Vmax:        0.71 m/s AV Area (Vmean):   2.69 cm?     PV Vmean:       51.100 cm/s AV Area (VTI):     2.40 cm?     PV VTI:         0.146 m AV Vmax:           108.50 cm/s  PV Peak grad:   2.0 mmHg AV Vmean:          67.250 cm/s  PV Mean grad:   1.0 mmHg AV VTI:            0.162 m      RVOT Peak grad: 4 mmHg AV Peak Grad:      4.7 mmHg AV Mean Grad:      2.0 mmHg LVOT Vmax:         89.40 cm/s LVOT Vmean:        57.500 cm/s LVOT VTI:          0.124 m LVOT/AV VTI ratio: 0.76  AORTA Ao Root diam: 3.30 cm MITRAL VALVE               TRICUSPID VALVE MV Area (PHT): 3.06 cm?    TR Peak grad:   35.0 mmHg MV Area VTI:   1.49 cm?    TR Vmax:        296.00 cm/s MV Peak grad:  4.7 mmHg MV Mean grad:  2.0 mmHg    SHUNTS MV Vmax:       1.08 m/s    Systemic VTI:  0.12 m MV Vmean:      58.8 cm/s   Systemic Diam: 2.00 cm MV Decel Time: 248 msec    Pulmonic VTI:  0.199 m MV E velocity: 80.60 cm/s MV A velocity: 62.10 cm/s MV E/A ratio:  1.30 Francisco Ammon Prince Rome MD Electronically signed by Yolonda Kida MD Signature Date/Time: 12/31/2021/10:11:32 AM    Final    ? ? ?Echo preserved left ventricular function EF around 50 to 55% no significant overt valvular abnormalities ? ?TELEMETRY: Normal sinus rhythm rate of 65: ? ?ASSESSMENT AND PLAN: ? ?Principal Problem: ?  SVT (supraventricular tachycardia) ([place]) ?Active Problems: ?  Myasthenia  gravis ([place]) ?  Paroxysmal atrial fibrillation ([facility]) ?  Hypothyroidism ?  Parkinson's disease ([place]) ?  Elevated troponin ?Chronic renal insufficiency ?COPD ?Hypertension ?Peripheral vascular disease ?Dyspnea on e

## 2021-12-31 NOTE — Care Management Obs Status (Signed)
MEDICARE OBSERVATION STATUS NOTIFICATION ? ? ?Patient Details  ?Name: Francisco [name] ?MRN: [id number] ?Date of Birth: 08-23-1946 ? ? ?Medicare Observation Status Notification Given:  Yes ? ? ? ?Laurena Slimmer, RN ?12/31/2021, 10:55 AM ?

## 2021-12-31 NOTE — TOC Transition Note (Addendum)
Transition of Care (TOC) - CM/SW Discharge Note ? ? ?Patient Details  ?Name: Francisco [name] ?MRN: [id number] ?Date of Birth: 07/09/1946 ? ?Transition of Care (TOC) CM/SW Contact:  ?Laurena Slimmer, RN ?Phone Number: ?12/31/2021, 1:42 PM ? ? ?Clinical Narrative:    ? ?[facility] outpatient referral sent. Order signed by MD and faxed to Kevin @ [phone] TOC signing off. ? ?  ?  ? ? ?Patient Goals and CMS Choice ?  ?  ?  ? ?Discharge Placement ?  ?           ?  ?  ?  ?  ? ?Discharge Plan and Services ?  ?  ?           ?  ?  ?  ?  ?  ?  ?  ?  ?  ?  ? ?Social Determinants of Health (SDOH) Interventions ?  ? ? ?Readmission Risk Interventions ?   ? View : No data to display.  ?  ?  ?  ? ? ? ? ? ?

## 2022-01-27 ENCOUNTER — Other Ambulatory Visit: Payer: Self-pay | Admitting: Neurosurgery

## 2022-01-27 ENCOUNTER — Other Ambulatory Visit (HOSPITAL_COMMUNITY): Payer: Self-pay | Admitting: Neurosurgery

## 2022-01-27 DIAGNOSIS — Z981 Arthrodesis status: Secondary | ICD-10-CM

## 2022-01-27 DIAGNOSIS — M48062 Spinal stenosis, lumbar region with neurogenic claudication: Secondary | ICD-10-CM

## 2022-01-27 DIAGNOSIS — G959 Disease of spinal cord, unspecified: Secondary | ICD-10-CM

## 2022-01-27 DIAGNOSIS — M4714 Other spondylosis with myelopathy, thoracic region: Secondary | ICD-10-CM

## 2022-02-16 ENCOUNTER — Other Ambulatory Visit: Payer: Medicare Other

## 2022-02-16 ENCOUNTER — Other Ambulatory Visit: Payer: Self-pay | Admitting: Neurology

## 2022-02-16 ENCOUNTER — Inpatient Hospital Stay: Admission: RE | Admit: 2022-02-16 | Payer: Medicare Other | Source: Ambulatory Visit

## 2022-02-16 DIAGNOSIS — R269 Unspecified abnormalities of gait and mobility: Secondary | ICD-10-CM

## 2022-02-16 DIAGNOSIS — T148XXA Other injury of unspecified body region, initial encounter: Secondary | ICD-10-CM

## 2022-02-22 ENCOUNTER — Other Ambulatory Visit: Payer: Self-pay | Admitting: Neurology

## 2022-02-22 ENCOUNTER — Ambulatory Visit
Admission: RE | Admit: 2022-02-22 | Discharge: 2022-02-22 | Disposition: A | Discharge status: Home / Self Care | Payer: Medicare Other | Source: Ambulatory Visit | Attending: Neurology | Admitting: Neurology

## 2022-02-22 DIAGNOSIS — R269 Unspecified abnormalities of gait and mobility: Secondary | ICD-10-CM | POA: Diagnosis present

## 2022-02-22 DIAGNOSIS — T148XXA Other injury of unspecified body region, initial encounter: Secondary | ICD-10-CM

## 2022-02-23 ENCOUNTER — Ambulatory Visit: Payer: Medicare Other | Attending: Neurosurgery

## 2022-02-23 VITALS — BP 143/72 | Ht 70.0 in | Wt 238.0 lb

## 2022-02-23 DIAGNOSIS — M545 Low back pain, unspecified: Secondary | ICD-10-CM

## 2022-02-23 DIAGNOSIS — R2681 Unsteadiness on feet: Secondary | ICD-10-CM

## 2022-02-23 DIAGNOSIS — R2689 Other abnormalities of gait and mobility: Secondary | ICD-10-CM | POA: Diagnosis present

## 2022-02-23 DIAGNOSIS — R269 Unspecified abnormalities of gait and mobility: Secondary | ICD-10-CM | POA: Diagnosis present

## 2022-02-23 DIAGNOSIS — R278 Other lack of coordination: Secondary | ICD-10-CM

## 2022-02-23 DIAGNOSIS — M6281 Muscle weakness (generalized): Secondary | ICD-10-CM | POA: Diagnosis present

## 2022-02-23 DIAGNOSIS — G8929 Other chronic pain: Secondary | ICD-10-CM | POA: Diagnosis present

## 2022-02-23 DIAGNOSIS — R262 Difficulty in walking, not elsewhere classified: Secondary | ICD-10-CM

## 2022-02-23 NOTE — Therapy (Addendum)
[place] MAIN [facility] SERVICES [address] [place], [place], [address] Phone: [phone]   Fax:  [phone]  Physical Therapy Evaluation  Patient Details  Name: Francisco [name] MRN: [id number] Date of Birth: 04/03/1946 Referring Provider (PT): Meade Maw   Encounter Date: 02/23/2022   PT End of Session - 02/24/22 0934     Visit Number 1    Number of Visits 25    Date for PT Re-Evaluation 05/18/22    Authorization Time Period 02/23/2022-05/18/2022    PT Start Time 53    PT Stop Time 1514    PT Time Calculation (min) 44 min    Equipment Utilized During Treatment Gait belt    Activity Tolerance Patient tolerated treatment well    Behavior During Therapy [facility] for tasks assessed/performed             Past Medical History:  Diagnosis Date   Arthritis    lower left hip   Atypical angina ([place])    Bilateral hand numbness    from back surgery   Bronchitis, chronic ([place])    Cancer ([place])    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; Merkel cell cancer, and Basal cell cancer (twice; 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   Carotid stenosis    CHF (congestive heart failure) (HCC)    CKD (chronic kidney disease)    CKD (chronic kidney disease) stage 3, GFR 30-59 ml/min (HCC)    COPD (chronic obstructive pulmonary disease) ([place])    stage 2   DDD (degenerative disc disease), cervical    Dysrhythmia    post carotid stent bradycardia; PAF 09/2020   GERD (gastroesophageal reflux disease)    Hypercholesterolemia    Hypertension    Hypothyroidism    pt takes Levothyroxine daily   Lumbosacral spinal stenosis    Myasthenia gravis, adult form ([place])    PAD (peripheral artery disease) ([place])    Shortness of breath    Lung MD- Dr Darlin Coco   Sleep apnea    do not use CPAP every night    Past Surgical History:  Procedure Laterality Date   ANTERIOR CERVICAL DECOMP/DISCECTOMY FUSION  07/18/2011   Procedure: ANTERIOR CERVICAL DECOMPRESSION/DISCECTOMY FUSION 2  LEVELS;  Surgeon: Cooper Render Pool;  Location: [place] NEURO ORS;  Service: Neurosurgery;  Laterality: N/A;  cervical five-six, cervical six-seven anterior cervical discectomy and fusion   BACK SURGERY     in [place]     01/2020 Right, 04/2020 Left   CARDIAC CATHETERIZATION     2005 at [facility], no stents   CAROTID PTA/STENT INTERVENTION N/A 09/17/2020   Procedure: CAROTID PTA/STENT INTERVENTION;  Surgeon: Algernon Huxley, MD;  Location: [place] CV LAB;  Service: Cardiovascular;  Laterality: N/A;   CATARACT EXTRACTION W/PHACO Left 01/06/2020   Procedure: CATARACT EXTRACTION PHACO AND INTRAOCULAR LENS PLACEMENT (IOC) ISTENT INJ LEFT 3.81  00:33.3;  Surgeon: Eulogio Bear, MD;  Location: [place];  Service: Ophthalmology;  Laterality: Left;   CATARACT EXTRACTION W/PHACO Right 02/03/2020   Procedure: CATARACT EXTRACTION PHACO AND INTRAOCULAR LENS PLACEMENT ([place]) RIGHT ISTENT INJ;  Surgeon: Eulogio Bear, MD;  Location: [place];  Service: Ophthalmology;  Laterality: Right;  4.29 0:35.6   COLONOSCOPY     HERNIA REPAIR Left    inguinal hernia repair in [place] MICRODISCECTOMY Left 02/24/2014   Procedure: LUMBAR LAMINECTOMY/DECOMPRESSION MICRODISCECTOMY LUMBAR THREE-FOUR, FOUR-FIVE, LEFT FIVE-SACRAL ONE ;  Surgeon: Charlie Pitter,  MD;  Location: [place] NEURO ORS;  Service: Neurosurgery;  Laterality: Left;  LUMBAR LAMINECTOMY/DECOMPRESSION MICRODISCECTOMY LUMBAR THREE-FOUR, FOUR-FIVE, LEFT FIVE-SACRAL ONE    LUMBAR LAMINECTOMY/DECOMPRESSION MICRODISCECTOMY N/A 05/03/2021   Procedure: Laminectomy and Foraminotomy - L2-L3;  Surgeon: Earnie Larsson, MD;  Location: [place];  Service: Neurosurgery;  Laterality: N/A;  3C   POSTERIOR CERVICAL FUSION/FORAMINOTOMY N/A 08/07/2020   Procedure: C3-6 POSTERIOR FUSION WITH DECOMPRESSION;  Surgeon: Meade Maw, MD;  Location: [facility] ORS;  Service: Neurosurgery;  Laterality: N/A;    PROSTATECTOMY  8/14   [facility] Dr Mare Ferrari     Vitals:   02/23/22 1448  BP: (!) 143/72  Weight: 238 lb (108 kg)  Height: '5\' 10"'$  (1.778 m)      Subjective Assessment - 02/23/22 1451     Subjective Patient reports when he last had PT in June of 2022 he was walking better and feeling like he was making progress but progressive back pain limited his progress and he underwent another spinal procedure in August of 2022 (lumbar laminectomy) and states he has been going downhill since with limited ability to walk. He complains of overall increased weakness and intermittent low back pain.    Pertinent History Francisco [name] is a 76 year old male referred to OPPT neuro for difficulty with balance. He was also just recently dx in May 2023 with Impingement syndrome of left shoulder (M75.42)  Impingement syndrome of left shoulder (primary encounter diagnosis)  Left shoulder pain, unspecified chronicity  Nontraumatic rupture of left proximal biceps tendon    Past medical hx includes Lumbar laminectomy 04/2021. C3-6 decompression, fusion on 11/26,  myasthenia gravis, 2012 ACDF 5/6, 6/7; OSA, hypothyroidism, HTN, COPD, multiple back surgeries, 2021 carpal tunnel release.    Limitations Walking;Lifting;Standing;House hold activities    How long can you sit comfortably? Not limited    How long can you stand comfortably? 3 min    How long can you walk comfortably? 0 minutes    Patient Stated Goals Be able to walk on my own without an assistive device.    Currently in Pain? Yes    Pain Score 7    7/10 at worst and 0/10 present   Pain Location Back    Pain Orientation Right;Left;Posterior;Lower    Pain Descriptors / Indicators Aching    Pain Type Chronic pain    Pain Onset More than a month ago    Pain Frequency Intermittent    Aggravating Factors  Standing/walking    Pain Relieving Factors rest    Effect of Pain on Daily Activities Difficulty with mobility and standing ADL's                 [facility] PT Assessment - 02/24/22 0929       Assessment   Medical Diagnosis Balance disorder    Referring Provider (PT) Meade Maw    Onset Date/Surgical Date 05/01/21    Hand Dominance Right    Prior Therapy Outpatient PT ended in 02/2021      Precautions   Precautions Fall      Restrictions   Weight Bearing Restrictions No      Balance Screen   Has the patient fallen in the past 6 months No    How many times? no    Has the patient had a decrease in activity level because of a fear of falling?  Yes    Is the patient reluctant to leave their home because of a fear of falling?  Yes  Home Environment   Living Environment Private residence    Living Arrangements Spouse/significant other    Available Help at Discharge Family    Type of [place] to enter    Entrance Stairs-Number of Steps [place] One level    [place] - 2 wheels;Walker - 4 wheels;Shower seat;Hand held shower head;Grab bars - tub/shower;Grab bars - toilet      Prior Function   Level of Independence Independent with household mobility with device;Requires assistive device for independence    Vocation Retired      Charity fundraiser Status Within Functional Limits for tasks assessed      Observation/Other Assessments   Focus on Therapeutic Outcomes (FOTO)  47                 OBJECTIVE  Musculoskeletal Tremor: Absent Bulk: Normal Tone: Normal, no clonus   Posture Forward head/shoulders; forward flexed posture.   Gait Patient requires RW for all mobility- exhibiting increased shuffling with minimal step length and no heel strike.   Strength R/L 3+/2- Hip flexion 3+/3+ Hip external rotation 3+/3+  Hip internal rotation 3+/3+  Hip extension   3+/3+ Hip abduction 3+/3+ Hip adduction  3+/3+ Knee extension 3+/3+  Knee flexion  3+/3+ Ankle Plantarflexion  3+/2- Ankle  Dorsiflexion   NEUROLOGICAL:  Mental Status Patient is oriented to person, place and time.  Recent memory is intact.  Remote memory is intact.  Attention span and concentration are intact.  Expressive speech is intact.  Patient's fund of knowledge is within normal limits for educational level.     Sensation Grossly intact to light touch bilateral UEs/LEs as determined by testing dermatomes C2-T2/L2-S2 respectively. Proprioception and hot/cold testing deferred on this date     FUNCTIONAL OUTCOME MEASURES   Results Comments  BERG To be tested next visit   FOTO 47   TUG 28.27 seconds using RW Fall risk, in need of intervention  5TSTS  31.52 seconds B hands on knees   10 Meter Gait Speed Self-selected: 25.83s = 0.39 m/s;  Below normative values for full community ambulation        ASSESSMENT Clinical Impression: Pt is a pleasant 76 year-old male/male returning to outpatient Neuro PT with referral for balance disroder. PT examination reveals deficits including B LE muscle weakenss, impaired gait and balance with increased risk of falling as seen by functional outcome testing. Patient will benefit from skilled PT services to address these deficits, improve balance, and decrease risk for future falls.    Rationale for Evaluation and Treatment Rehabilitation                 Objective measurements completed on examination: See above findings.                PT Education - 02/24/22 0933     Education provided Yes    Education Details PT Plan of care    Person(s) Educated Patient    Methods Explanation    Comprehension Verbalized understanding              PT Short Term Goals - 02/24/22 0954       PT SHORT TERM GOAL #1   Title Pt will be independent with initial HEP in order to improve strength and balance in order to decrease fall risk and improve function at home and work.    Baseline  02/23/2022- Patient reports not doing much as far as  exercise in the home currently.    Time 6    Period Weeks    Status New    Target Date 04/06/22               PT Long Term Goals - 02/24/22 0959       PT LONG TERM GOAL #1   Title Pt will be independent with final HEP in order to improve strength and balance in order to decrease fall risk and improve function at home and work.    Baseline 02/23/2022= No formal HEP in place.    Time 12    Period Weeks    Status New    Target Date 05/18/22      PT LONG TERM GOAL #2   Title Pt will decrease 5TSTS by at least 8 seconds in order to demonstrate clinically significant improvement in LE strength.    Baseline 02/23/2022= 31.52 sec with B hands on knees    Time 12    Period Days    Status New    Target Date 05/18/22      PT LONG TERM GOAL #3   Title Pt will increase 10MWT by at least 0.23 m/s in order to demonstrate clinically significant improvement in community ambulation.    Baseline 02/23/2022= 0.39 m/s using RW    Time 12    Period Weeks    Status New    Target Date 05/18/22      PT LONG TERM GOAL #4   Title Pt will improve FOTO to target score of 50 to display perceived improvements in ability to complete ADL's.    Baseline 02/23/2022= 47    Time 12    Period Weeks    Status New    Target Date 05/18/22      PT LONG TERM GOAL #5   Title Pt will decrease TUG to below 20 seconds/decrease in order to demonstrate decreased fall risk.    Baseline 02/23/2022=28.27 sec with RW    Time 12    Period Weeks    Status New    Target Date 05/18/22                    Plan - 02/23/22 0935     Clinical Impression Statement Pt is a pleasant 76 year-old male/male returning to outpatient Neuro PT with referral for balance disroder. PT examination reveals deficits including B LE muscle weakenss, impaired gait and balance with increased risk of falling as seen by functional outcome testing. Patient will benefit from skilled PT services to address these deficits, improve  balance, and decrease risk for future falls.    Personal Factors and Comorbidities Age;Past/Current Experience;Time since onset of injury/illness/exacerbation;Comorbidity 3+    Comorbidities COPD, History of Cancer, myasthenia gravis, chronic lumbar surgical history    Examination-Activity Limitations Lift;Squat;Bend;Stairs;Stand;Transfers;Insurance claims handler;Bathing;Hygiene/Grooming;Dressing;Toileting;Bed Mobility;Caring for Others;Continence    Examination-Participation Restrictions Yard Work;Church;Cleaning;Driving;Community Activity    Stability/Clinical Decision Making Evolving/Moderate complexity    Clinical Decision Making High    Rehab Potential Good    Clinical Impairments Affecting Rehab Potential multipe comorbidities    PT Frequency 2x / week    PT Duration 12 weeks    PT Treatment/Interventions ADLs/Self Care Home Management;Electrical Stimulation;Moist Heat;Traction;Ultrasound;Gait training;Stair training;Functional mobility training;Therapeutic activities;Therapeutic exercise;Balance training;Neuromuscular re-education;Patient/family education;Manual techniques;Passive range of motion;Dry needling;Joint Manipulations;Cryotherapy;Vestibular;Canalith Repostioning    PT Next Visit Plan Conduct BERG balance test and add goal; Instruct in LE strengthening  and balance activities for improved and safe mobility    PT Home Exercise Plan To be initiated next 1-2 visits    Consulted and Agree with Plan of Care Patient             Patient will benefit from skilled therapeutic intervention in order to improve the following deficits and impairments:  Abnormal gait, Decreased balance, Decreased mobility, Decreased endurance, Difficulty walking, Hypomobility, Impaired sensation, Decreased range of motion, Impaired perceived functional ability, Decreased activity tolerance, Decreased coordination, Decreased strength, Impaired flexibility, Postural dysfunction, Pain  Visit  Diagnosis: Abnormality of gait and mobility  Difficulty in walking, not elsewhere classified  Muscle weakness (generalized)  Other abnormalities of gait and mobility  Other lack of coordination  Unsteadiness on feet  Chronic bilateral low back pain, unspecified whether sciatica present     Problem List Patient Active Problem List   Diagnosis Date Noted   SVT (supraventricular tachycardia) ([place]) 12/30/2021   Hypothyroidism 12/30/2021   Parkinson's disease ([place]) 12/30/2021   Elevated troponin 12/30/2021   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   Acquired thrombophilia ([place]) 01/13/2021   History of decompression of median nerve 01/01/2021   Paroxysmal atrial fibrillation ([place]) 10/06/2020   Carotid stenosis, right 09/17/2020   S/P cervical spinal fusion    Leukocytosis    Essential hypertension    Anemia of chronic disease    Postoperative pain    Neuropathic pain    Cervical myelopathy ([place]) 08/07/2020   Preop cardiovascular exam 07/17/2020   SOB (shortness of breath) on exertion 07/17/2020   Leg weakness, bilateral 07/13/2020   Aortic atherosclerosis ([place]) 07/09/2020   Body mass index (BMI) 34.0-34.9, adult 12/25/2019   Myalgia 10/30/2019   Lumbar post-laminectomy syndrome 10/24/2019   PAD (peripheral artery disease) ([place]) 06/06/2019   CKD (chronic kidney disease) stage 3, GFR 30-59 ml/min (HCC) 04/29/2019   B12 deficiency 01/23/2019   Left arm numbness 02/28/2018   Neck pain 02/28/2018   Anemia 02/02/2017   DDD (degenerative disc disease), cervical 02/02/2017   Hypothyroid 02/02/2017   MRSA (methicillin resistant staph aureus) culture positive 02/02/2017   Nocturnal hypoxia 02/02/2017   Senile purpura ([place]) 10/03/2016   Essential hypertension, benign 09/16/2016   Bilateral carotid artery disease ([place]) 09/16/2016   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/18/2016   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   History of prostate cancer 12/21/2015    Left carpal tunnel syndrome 11/11/2015   Kidney stone on left side 07/05/2015   Myasthenia gravis ([place]) 05/26/2015   Radiculitis 01/05/2015   Long-term use of high-risk medication 10/15/2014   Persistent cough 09/10/2014   Pure hypercholesterolemia 07/25/2014   Spinal stenosis, lumbar region, with neurogenic claudication 02/24/2014   Lumbosacral stenosis with neurogenic claudication 02/24/2014   COPD (chronic obstructive pulmonary disease) ([place]) 01/22/2014    Lewis Moccasin, PT 02/24/2022, 10:05 AM  [place] MAIN [facility] SERVICES [address] [place], [place], [address] Phone: [phone]   Fax:  [phone]  Name: MESSIYAH WATERSON MRN: [id number] Date of Birth: [birth year]

## 2022-03-01 ENCOUNTER — Ambulatory Visit: Payer: Medicare Other

## 2022-03-01 DIAGNOSIS — R269 Unspecified abnormalities of gait and mobility: Secondary | ICD-10-CM | POA: Diagnosis not present

## 2022-03-01 DIAGNOSIS — R262 Difficulty in walking, not elsewhere classified: Secondary | ICD-10-CM

## 2022-03-01 DIAGNOSIS — M6281 Muscle weakness (generalized): Secondary | ICD-10-CM

## 2022-03-01 DIAGNOSIS — R2689 Other abnormalities of gait and mobility: Secondary | ICD-10-CM

## 2022-03-01 NOTE — Therapy (Addendum)
OUTPATIENT PHYSICAL THERAPY TREATMENT NOTE   Patient Name: Francisco [name] MRN: [id number] DOB:[birth year], 76 y.o., male Today's Date: 03/01/2022  PCP: Adin Hector, MD REFERRING PROVIDER: Meade Maw MD    PT End of Session - 03/01/22 1525     Visit Number 2    Number of Visits 25    Date for PT Re-Evaluation 05/18/22    Authorization Time Period 02/23/2022-05/18/2022    PT Start Time 40    PT Stop Time 1514    PT Time Calculation (min) 44 min    Equipment Utilized During Treatment Gait belt    Activity Tolerance Patient tolerated treatment well    Behavior During Therapy [facility] for tasks assessed/performed             Past Medical History:  Diagnosis Date   Arthritis    lower left hip   Atypical angina (HCC)    Bilateral hand numbness    from back surgery   Bronchitis, chronic ([place])    Cancer ([place])    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; Merkel cell cancer, and Basal cell cancer (twice; 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   Carotid stenosis    CHF (congestive heart failure) (HCC)    CKD (chronic kidney disease)    CKD (chronic kidney disease) stage 3, GFR 30-59 ml/min (HCC)    COPD (chronic obstructive pulmonary disease) ([place])    stage 2   DDD (degenerative disc disease), cervical    Dysrhythmia    post carotid stent bradycardia; PAF 09/2020   GERD (gastroesophageal reflux disease)    Hypercholesterolemia    Hypertension    Hypothyroidism    pt takes Levothyroxine daily   Lumbosacral spinal stenosis    Myasthenia gravis, adult form ([place])    PAD (peripheral artery disease) ([place])    Shortness of breath    Lung MD- Dr Darlin Coco   Sleep apnea    do not use CPAP every night   Past Surgical History:  Procedure Laterality Date   ANTERIOR CERVICAL DECOMP/DISCECTOMY FUSION  07/18/2011   Procedure: ANTERIOR CERVICAL DECOMPRESSION/DISCECTOMY FUSION 2 LEVELS;  Surgeon: Cooper Render Pool;  Location: [place] [facility];  Service: Neurosurgery;  Laterality: N/A;  cervical five-six, cervical  six-seven anterior cervical discectomy and fusion   BACK SURGERY     in [place]     01/2020 Right, 04/2020 Left   CARDIAC CATHETERIZATION     2005 at [facility], no stents   CAROTID PTA/STENT INTERVENTION N/A 09/17/2020   Procedure: CAROTID PTA/STENT INTERVENTION;  Surgeon: Algernon Huxley, MD;  Location: [place] [facility];  Service: Cardiovascular;  Laterality: N/A;   CATARACT EXTRACTION W/PHACO Left 01/06/2020   Procedure: CATARACT EXTRACTION PHACO AND INTRAOCULAR LENS PLACEMENT (IOC) ISTENT INJ LEFT 3.81  00:33.3;  Surgeon: Eulogio Bear, MD;  Location: [place];  Service: Ophthalmology;  Laterality: Left;   CATARACT EXTRACTION W/PHACO Right 02/03/2020   Procedure: CATARACT EXTRACTION PHACO AND INTRAOCULAR LENS PLACEMENT ([place]) RIGHT ISTENT INJ;  Surgeon: Eulogio Bear, MD;  Location: [place];  Service: Ophthalmology;  Laterality: Right;  4.29 0:35.6   COLONOSCOPY     HERNIA REPAIR Left    inguinal hernia repair in 1985   LUMBAR LAMINECTOMY/DECOMPRESSION MICRODISCECTOMY Left 02/24/2014   Procedure: LUMBAR LAMINECTOMY/DECOMPRESSION MICRODISCECTOMY LUMBAR THREE-FOUR, FOUR-FIVE, LEFT FIVE-SACRAL ONE ;  Surgeon: Charlie Pitter, MD;  Location: [place] [facility];  Service: Neurosurgery;  Laterality: Left;  LUMBAR LAMINECTOMY/DECOMPRESSION MICRODISCECTOMY LUMBAR THREE-FOUR,  FOUR-FIVE, LEFT FIVE-SACRAL ONE    LUMBAR LAMINECTOMY/DECOMPRESSION MICRODISCECTOMY N/A 05/03/2021   Procedure: Laminectomy and Foraminotomy - L2-L3;  Surgeon: Earnie Larsson, MD;  Location: [place];  Service: Neurosurgery;  Laterality: N/A;  3C   POSTERIOR CERVICAL FUSION/FORAMINOTOMY N/A 08/07/2020   Procedure: C3-6 POSTERIOR FUSION WITH DECOMPRESSION;  Surgeon: Meade Maw, MD;  Location: [facility];  Service: Neurosurgery;  Laterality: N/A;   PROSTATECTOMY  8/14   [facility] Dr Mare Ferrari    Patient Active Problem List   Diagnosis Date Noted   SVT (supraventricular  tachycardia) ([place]) 12/30/2021   Hypothyroidism 12/30/2021   Parkinson's disease ([place]) 12/30/2021   Elevated troponin 12/30/2021   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   Acquired thrombophilia ([place]) 01/13/2021   History of decompression of median nerve 01/01/2021   Paroxysmal atrial fibrillation ([place]) 10/06/2020   Carotid stenosis, right 09/17/2020   S/P cervical spinal fusion    Leukocytosis    Essential hypertension    Anemia of chronic disease    Postoperative pain    Neuropathic pain    Cervical myelopathy ([place]) 08/07/2020   Preop cardiovascular exam 07/17/2020   SOB (shortness of breath) on exertion 07/17/2020   Leg weakness, bilateral 07/13/2020   Aortic atherosclerosis ([place]) 07/09/2020   Body mass index (BMI) 34.0-34.9, adult 12/25/2019   Myalgia 10/30/2019   Lumbar post-laminectomy syndrome 10/24/2019   PAD (peripheral artery disease) ([place]) 06/06/2019   CKD (chronic kidney disease) stage 3, GFR 30-59 ml/min (HCC) 04/29/2019   B12 deficiency 01/23/2019   Left arm numbness 02/28/2018   Neck pain 02/28/2018   Anemia 02/02/2017   DDD (degenerative disc disease), cervical 02/02/2017   Hypothyroid 02/02/2017   MRSA (methicillin resistant staph aureus) culture positive 02/02/2017   Nocturnal hypoxia 02/02/2017   Senile purpura ([place]) 10/03/2016   Essential hypertension, benign 09/16/2016   Bilateral carotid artery disease ([place]) 09/16/2016   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/18/2016   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   History of prostate cancer 12/21/2015   Left carpal tunnel syndrome 11/11/2015   Kidney stone on left side 07/05/2015   Myasthenia gravis ([place]) 05/26/2015   Radiculitis 01/05/2015   Long-term use of high-risk medication 10/15/2014   Persistent cough 09/10/2014   Pure hypercholesterolemia 07/25/2014   Spinal stenosis, lumbar region, with neurogenic claudication 02/24/2014   Lumbosacral stenosis with neurogenic claudication  02/24/2014   COPD (chronic obstructive pulmonary disease) ([place]) 01/22/2014    REFERRING DIAG: balance disorder   THERAPY DIAG:  Abnormality of gait and mobility  Difficulty in walking, not elsewhere classified  Muscle weakness (generalized)  Other abnormalities of gait and mobility  Rationale for Evaluation and Treatment Rehabilitation  PERTINENT HISTORY: Avyan Livesay is a 76 year old male referred to [facility] neuro for difficulty with balance. He was also just recently dx in May 2023 with Impingement syndrome of left shoulder (M75.42) Impingement syndrome of left shoulder (primary encounter diagnosis) Left shoulder pain, unspecified chronicity Nontraumatic rupture of left proximal biceps tendon Past medical hx includes Lumbar laminectomy 04/2021. C3-6 decompression, fusion on 11/26, myasthenia gravis, 2012 ACDF 5/6, 6/7; OSA, hypothyroidism, HTN, COPD, multiple back surgeries, 2021 carpal tunnel release.  PRECAUTIONS: Fall  SUBJECTIVE: Patient reports he is going to his brothers wake and will miss next session due to the death in his family.   PAIN:  Are you having pain? No     TODAY'S TREATMENT:  03/01/22: Neuro Re-ed BERG: 23/56  Static stand and reach outside BOS with dual task of cognitive  word game with horizontal and vertical head turns Standing toe taps to hedgehogs based on PT guidance x 10 each LE   TherEx: Sit to stand with bar 10x  Give HEP: Access Code: [email or web address] URL: [email or web address] Date: 03/01/2022 Prepared by: Janna Arch  Exercises - Seated March  - 1 x daily - 7 x weekly - 2 sets - 10 reps - 5 hold - Seated Long Arc Quad  - 1 x daily - 7 x weekly - 2 sets - 10 reps - 5 hold - Seated Hip Abduction  - 1 x daily - 7 x weekly - 2 sets - 10 reps - 5 hold - Seated Hip Adduction Isometrics with Ball  - 1 x daily - 7 x weekly - 2 sets - 10 reps - 5 hold - Seated Gluteal Sets  - 1 x daily - 7 x weekly - 2 sets - 10 reps - 5 hold - Seated  Heel Toe Raises  - 1 x daily - 7 x weekly - 2 sets - 10 reps - 5 hold LE strength and balance     PATIENT EDUCATION: Education details: HEP, body mechanics, BERG test Person educated: Patient Education method: Explanation, Demonstration, Tactile cues, Verbal cues, and Handouts Education comprehension: verbalized understanding, returned demonstration, verbal cues required, and tactile cues required   HOME EXERCISE PROGRAM: 03/01/22 Access Code: [email or web address] URL: [email or web address] Date: 03/01/2022 Prepared by: Janna Arch  Exercises - Seated March  - 1 x daily - 7 x weekly - 2 sets - 10 reps - 5 hold - Seated Long Arc Quad  - 1 x daily - 7 x weekly - 2 sets - 10 reps - 5 hold - Seated Hip Abduction  - 1 x daily - 7 x weekly - 2 sets - 10 reps - 5 hold - Seated Hip Adduction Isometrics with Ball  - 1 x daily - 7 x weekly - 2 sets - 10 reps - 5 hold - Seated Gluteal Sets  - 1 x daily - 7 x weekly - 2 sets - 10 reps - 5 hold - Seated Heel Toe Raises  - 1 x daily - 7 x weekly - 2 sets - 10 reps - 5 hold   PT Short Term Goals       PT SHORT TERM GOAL #1   Title Pt will be independent with initial HEP in order to improve strength and balance in order to decrease fall risk and improve function at home and work.    Baseline 02/23/2022- Patient reports not doing much as far as exercise in the home currently.    Time 6    Period Weeks    Status New    Target Date 04/06/22              PT Long Term Goals       PT LONG TERM GOAL #1   Title Pt will be independent with final HEP in order to improve strength and balance in order to decrease fall risk and improve function at home and work.    Baseline 02/23/2022= No formal HEP in place.    Time 12    Period Weeks    Status New    Target Date 05/18/22      PT LONG TERM GOAL #2   Title Pt will decrease 5TSTS by at least 8 seconds in order to demonstrate clinically significant improvement in LE strength.    Baseline  02/23/2022= 31.52 sec with B hands on  knees    Time 12    Period Days    Status New    Target Date 05/18/22      PT LONG TERM GOAL #3   Title Pt will increase 10MWT by at least 0.23 m/s in order to demonstrate clinically significant improvement in community ambulation.    Baseline 02/23/2022= 0.39 m/s using RW    Time 12    Period Weeks    Status New    Target Date 05/18/22      PT LONG TERM GOAL #4   Title Pt will improve FOTO to target score of 50 to display perceived improvements in ability to complete ADL's.    Baseline 02/23/2022= 47    Time 12    Period Weeks    Status New    Target Date 05/18/22      PT LONG TERM GOAL #5   Title Pt will decrease TUG to below 20 seconds/decrease in order to demonstrate decreased fall risk.    Baseline 02/23/2022=28.27 sec with RW    Time 12    Period Weeks    Status New    Target Date 05/18/22              Plan -     Clinical Impression Statement Patient presents with excellent motivation. He is able to perform BERG this session but does have fear of LOB and limited stabilization as can be seen in score of 23/56. Patient educated on HEP and demonstrated understanding. Patient will benefit from skilled PT services to address these deficits, improve balance, and decrease risk for future falls.    Personal Factors and Comorbidities Age;Past/Current Experience;Time since onset of injury/illness/exacerbation;Comorbidity 3+    Comorbidities COPD, History of Cancer, myasthenia gravis, chronic lumbar surgical history    Examination-Activity Limitations Lift;Squat;Bend;Stairs;Stand;Transfers;Insurance claims handler;Bathing;Hygiene/Grooming;Dressing;Toileting;Bed Mobility;Caring for Others;Continence    Examination-Participation Restrictions Yard Work;Church;Cleaning;Driving;Community Activity    Stability/Clinical Decision Making Evolving/Moderate complexity    Rehab Potential Good    Clinical Impairments Affecting Rehab Potential  multipe comorbidities    PT Frequency 2x / week    PT Duration 12 weeks    PT Treatment/Interventions ADLs/Self Care Home Management;Electrical Stimulation;Moist Heat;Traction;Ultrasound;Gait training;Stair training;Functional mobility training;Therapeutic activities;Therapeutic exercise;Balance training;Neuromuscular re-education;Patient/family education;Manual techniques;Passive range of motion;Dry needling;Joint Manipulations;Cryotherapy;Vestibular;Canalith Repostioning    PT Next Visit Plan standing strengthening and balance, seated strengthening and coordination    PT Home Exercise Plan To be initiated next 1-2 visits    Consulted and Agree with Plan of Care Patient              Janna Arch, PT, DPT  03/01/2022, 3:30 PM

## 2022-03-03 ENCOUNTER — Ambulatory Visit: Payer: Medicare Other | Admitting: Physical Therapy

## 2022-03-17 ENCOUNTER — Ambulatory Visit: Payer: Medicare Other | Attending: Neurosurgery

## 2022-03-17 DIAGNOSIS — G8929 Other chronic pain: Secondary | ICD-10-CM | POA: Insufficient documentation

## 2022-03-17 DIAGNOSIS — M545 Low back pain, unspecified: Secondary | ICD-10-CM | POA: Insufficient documentation

## 2022-03-17 DIAGNOSIS — R2689 Other abnormalities of gait and mobility: Secondary | ICD-10-CM | POA: Diagnosis present

## 2022-03-17 DIAGNOSIS — R269 Unspecified abnormalities of gait and mobility: Secondary | ICD-10-CM | POA: Diagnosis not present

## 2022-03-17 DIAGNOSIS — R262 Difficulty in walking, not elsewhere classified: Secondary | ICD-10-CM | POA: Diagnosis present

## 2022-03-17 DIAGNOSIS — M6281 Muscle weakness (generalized): Secondary | ICD-10-CM | POA: Diagnosis present

## 2022-03-17 DIAGNOSIS — R2681 Unsteadiness on feet: Secondary | ICD-10-CM | POA: Diagnosis present

## 2022-03-17 DIAGNOSIS — R278 Other lack of coordination: Secondary | ICD-10-CM | POA: Insufficient documentation

## 2022-03-17 NOTE — Therapy (Signed)
OUTPATIENT PHYSICAL THERAPY TREATMENT NOTE   Patient Name: Francisco [name] MRN: [id number] DOB:1945/09/24, 76 y.o., male Today's Date: 03/18/2022  PCP: Adin Hector, MD REFERRING PROVIDER: Meade Maw MD    PT End of Session - 03/17/22 1452     Visit Number 3    Number of Visits 25    Date for PT Re-Evaluation 05/18/22    Authorization Time Period 02/23/2022-05/18/2022    PT Start Time 1434    PT Stop Time 1514    PT Time Calculation (min) 40 min    Equipment Utilized During Treatment Gait belt    Activity Tolerance Patient tolerated treatment well    Behavior During Therapy WFL for tasks assessed/performed             Past Medical History:  Diagnosis Date   Arthritis    lower left hip   Atypical angina (HCC)    Bilateral hand numbness    from back surgery   Bronchitis, chronic ([place])    Cancer ([place])    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; Merkel cell cancer, and Basal cell cancer (twice; 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   Carotid stenosis    CHF (congestive heart failure) (HCC)    CKD (chronic kidney disease)    CKD (chronic kidney disease) stage 3, GFR 30-59 ml/min (HCC)    COPD (chronic obstructive pulmonary disease) ([place])    stage 2   DDD (degenerative disc disease), cervical    Dysrhythmia    post carotid stent bradycardia; PAF 09/2020   GERD (gastroesophageal reflux disease)    Hypercholesterolemia    Hypertension    Hypothyroidism    pt takes Levothyroxine daily   Lumbosacral spinal stenosis    Myasthenia gravis, adult form ([place])    PAD (peripheral artery disease) ([place])    Shortness of breath    Lung MD- Dr Darlin Coco   Sleep apnea    do not use CPAP every night   Past Surgical History:  Procedure Laterality Date   ANTERIOR CERVICAL DECOMP/DISCECTOMY FUSION  07/18/2011   Procedure: ANTERIOR CERVICAL DECOMPRESSION/DISCECTOMY FUSION 2 LEVELS;  Surgeon: Cooper Render Pool;  Location: [facility];  Service: Neurosurgery;  Laterality: N/A;  cervical five-six, cervical  six-seven anterior cervical discectomy and fusion   BACK SURGERY     in [place]     01/2020 Right, 04/2020 Left   CARDIAC CATHETERIZATION     2005 at [facility], no stents   CAROTID PTA/STENT INTERVENTION N/A 09/17/2020   Procedure: CAROTID PTA/STENT INTERVENTION;  Surgeon: Algernon Huxley, MD;  Location: [facility];  Service: Cardiovascular;  Laterality: N/A;   CATARACT EXTRACTION W/PHACO Left 01/06/2020   Procedure: CATARACT EXTRACTION PHACO AND INTRAOCULAR LENS PLACEMENT (IOC) ISTENT INJ LEFT 3.81  00:33.3;  Surgeon: Eulogio Bear, MD;  Location: [facility];  Service: Ophthalmology;  Laterality: Left;   CATARACT EXTRACTION W/PHACO Right 02/03/2020   Procedure: CATARACT EXTRACTION PHACO AND INTRAOCULAR LENS PLACEMENT ([place]) RIGHT ISTENT INJ;  Surgeon: Eulogio Bear, MD;  Location: [facility];  Service: Ophthalmology;  Laterality: Right;  4.29 0:35.6   COLONOSCOPY     HERNIA REPAIR Left    inguinal hernia repair in 1985   LUMBAR LAMINECTOMY/DECOMPRESSION MICRODISCECTOMY Left 02/24/2014   Procedure: LUMBAR LAMINECTOMY/DECOMPRESSION MICRODISCECTOMY LUMBAR THREE-FOUR, FOUR-FIVE, LEFT FIVE-SACRAL ONE ;  Surgeon: Charlie Pitter, MD;  Location: [facility];  Service: Neurosurgery;  Laterality: Left;  LUMBAR LAMINECTOMY/DECOMPRESSION MICRODISCECTOMY LUMBAR THREE-FOUR,  FOUR-FIVE, LEFT FIVE-SACRAL ONE    LUMBAR LAMINECTOMY/DECOMPRESSION MICRODISCECTOMY N/A 05/03/2021   Procedure: Laminectomy and Foraminotomy - L2-L3;  Surgeon: Earnie Larsson, MD;  Location: [facility];  Service: Neurosurgery;  Laterality: N/A;  3C   POSTERIOR CERVICAL FUSION/FORAMINOTOMY N/A 08/07/2020   Procedure: C3-6 POSTERIOR FUSION WITH DECOMPRESSION;  Surgeon: Meade Maw, MD;  Location: [facility];  Service: Neurosurgery;  Laterality: N/A;   PROSTATECTOMY  8/14   [facility] Dr Mare Ferrari    Patient Active Problem List   Diagnosis Date Noted   SVT (supraventricular  tachycardia) ([place]) 12/30/2021   Hypothyroidism 12/30/2021   Parkinson's disease ([place]) 12/30/2021   Elevated troponin 12/30/2021   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   Acquired thrombophilia ([place]) 01/13/2021   History of decompression of median nerve 01/01/2021   Paroxysmal atrial fibrillation ([place]) 10/06/2020   Carotid stenosis, right 09/17/2020   S/P cervical spinal fusion    Leukocytosis    Essential hypertension    Anemia of chronic disease    Postoperative pain    Neuropathic pain    Cervical myelopathy ([place]) 08/07/2020   Preop cardiovascular exam 07/17/2020   SOB (shortness of breath) on exertion 07/17/2020   Leg weakness, bilateral 07/13/2020   Aortic atherosclerosis ([place]) 07/09/2020   Body mass index (BMI) 34.0-34.9, adult 12/25/2019   Myalgia 10/30/2019   Lumbar post-laminectomy syndrome 10/24/2019   PAD (peripheral artery disease) ([place]) 06/06/2019   CKD (chronic kidney disease) stage 3, GFR 30-59 ml/min (HCC) 04/29/2019   B12 deficiency 01/23/2019   Left arm numbness 02/28/2018   Neck pain 02/28/2018   Anemia 02/02/2017   DDD (degenerative disc disease), cervical 02/02/2017   Hypothyroid 02/02/2017   MRSA (methicillin resistant staph aureus) culture positive 02/02/2017   Nocturnal hypoxia 02/02/2017   Senile purpura ([place]) 10/03/2016   Essential hypertension, benign 09/16/2016   Bilateral carotid artery disease ([place]) 09/16/2016   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/18/2016   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) 05/26/2015   Radiculitis 01/05/2015   Long-term use of high-risk medication 10/15/2014   Persistent cough 09/10/2014   Pure hypercholesterolemia 07/25/2014   Spinal stenosis, lumbar region, with neurogenic claudication 02/24/2014   Lumbosacral stenosis with neurogenic claudication  02/24/2014   COPD (chronic obstructive pulmonary disease) ([place]) 01/22/2014    REFERRING DIAG: balance disorder   THERAPY DIAG:  Abnormality of gait and mobility  Difficulty in walking, not elsewhere classified  Muscle weakness (generalized)  Other abnormalities of gait and mobility  Other lack of coordination  Unsteadiness on feet  Chronic bilateral low back pain, unspecified whether sciatica present  Rationale for Evaluation and Treatment Rehabilitation  PERTINENT HISTORY: Arjuna Doeden is a 76 year old male referred to [facility] neuro for difficulty with balance. He was also just recently dx in May 2023 with Impingement syndrome of left shoulder (M75.42) Impingement syndrome of left shoulder (primary encounter diagnosis) Left shoulder pain, unspecified chronicity Nontraumatic rupture of left proximal biceps tendon Past medical hx includes Lumbar laminectomy 04/2021. C3-6 decompression, fusion on 11/26, myasthenia gravis, 2012 ACDF 5/6, 6/7; OSA, hypothyroidism, HTN, COPD, multiple back surgeries, 2021 carpal tunnel release.  PRECAUTIONS: Fall  SUBJECTIVE: Patient reports he is going to his brothers wake and will miss next session due to the death in his family.   PAIN:  Are you having pain? No     TODAY'S  TREATMENT:  03/17/2022 Neuro Re-ed   TherEx: Interval Nustep with LE only- Seat at 10 L0-30 sec L2- 45 sec L3-45 sec  L4- 30 sec L0-30 sec L2- 30sec L4-5mn 30 sec L0-1 min  Step tap onto 1/2 foam roll x 15 reps with no UE support.  Step up and over 1/2 foam roll x 12 reps with min UE Support Calf raises (from 1/2 foam roll) x 15 reps BLE Seated Hip flex/up/over hedgehog x 12 reps BLE Seated hamstring BTB x 12 reps- Patient rated as hard.   PATIENT EDUCATION: Education details: HEP, bEconomist BERG test Person educated: Patient Education method: Explanation, Demonstration, Tactile cues, Verbal cues, and Handouts Education comprehension: verbalized  understanding, returned demonstration, verbal cues required, and tactile cues required   HOME EXERCISE PROGRAM:  No Updates today  Access Code: [email or web address]: [email or web address] Date: 03/01/2022 Prepared by: MJanna Arch Exercises - Seated March  - 1 x daily - 7 x weekly - 2 sets - 10 reps - 5 hold - Seated Long Arc Quad  - 1 x daily - 7 x weekly - 2 sets - 10 reps - 5 hold - Seated Hip Abduction  - 1 x daily - 7 x weekly - 2 sets - 10 reps - 5 hold - Seated Hip Adduction Isometrics with Ball  - 1 x daily - 7 x weekly - 2 sets - 10 reps - 5 hold - Seated Gluteal Sets  - 1 x daily - 7 x weekly - 2 sets - 10 reps - 5 hold - Seated Heel Toe Raises  - 1 x daily - 7 x weekly - 2 sets - 10 reps - 5 hold LE strength and balance        03/01/22 Access Code: [email or web address]: [email or web address] Date: 03/01/2022 Prepared by: MJanna Arch Exercises - Seated March  - 1 x daily - 7 x weekly - 2 sets - 10 reps - 5 hold - Seated Long Arc Quad  - 1 x daily - 7 x weekly - 2 sets - 10 reps - 5 hold - Seated Hip Abduction  - 1 x daily - 7 x weekly - 2 sets - 10 reps - 5 hold - Seated Hip Adduction Isometrics with Ball  - 1 x daily - 7 x weekly - 2 sets - 10 reps - 5 hold - Seated Gluteal Sets  - 1 x daily - 7 x weekly - 2 sets - 10 reps - 5 hold - Seated Heel Toe Raises  - 1 x daily - 7 x weekly - 2 sets - 10 reps - 5 hold   PT Short Term Goals       PT SHORT TERM GOAL #1   Title Pt will be independent with initial HEP in order to improve strength and balance in order to decrease fall risk and improve function at home and work.    Baseline 02/23/2022- Patient reports not doing much as far as exercise in the home currently.    Time 6    Period Weeks    Status New    Target Date 04/06/22              PT Long Term Goals       PT LONG TERM GOAL #1   Title Pt will be independent with final HEP in order to improve strength and balance in order to  decrease fall risk and improve function at home and work.    Baseline  02/23/2022= No formal HEP in place.    Time 12    Period Weeks    Status New    Target Date 05/18/22      PT LONG TERM GOAL #2   Title Pt will decrease 5TSTS by at least 8 seconds in order to demonstrate clinically significant improvement in LE strength.    Baseline 02/23/2022= 31.52 sec with B hands on knees    Time 12    Period Days    Status New    Target Date 05/18/22      PT LONG TERM GOAL #3   Title Pt will increase 10MWT by at least 0.23 m/s in order to demonstrate clinically significant improvement in community ambulation.    Baseline 02/23/2022= 0.39 m/s using RW    Time 12    Period Weeks    Status New    Target Date 05/18/22      PT LONG TERM GOAL #4   Title Pt will improve FOTO to target score of 50 to display perceived improvements in ability to complete ADL's.    Baseline 02/23/2022= 47    Time 12    Period Weeks    Status New    Target Date 05/18/22      PT LONG TERM GOAL #5   Title Pt will decrease TUG to below 20 seconds/decrease in order to demonstrate decreased fall risk.    Baseline 02/23/2022=28.27 sec with RW    Time 12    Period Weeks    Status New    Target Date 05/18/22              Plan -     Clinical Impression Statement Patient presents with excellent motivation. He was able to complete several standing and seated LE strengthening activities without report of pain. He was limited only by fatigue today. Patient will benefit from skilled PT services to address these deficits, improve balance, and decrease risk for future falls.    Personal Factors and Comorbidities Age;Past/Current Experience;Time since onset of injury/illness/exacerbation;Comorbidity 3+    Comorbidities COPD, History of Cancer, myasthenia gravis, chronic lumbar surgical history    Examination-Activity Limitations Lift;Squat;Bend;Stairs;Stand;Transfers;Engineer, maintenance;Bathing;Hygiene/Grooming;Dressing;Toileting;Bed Mobility;Caring for Others;Continence    Examination-Participation Restrictions Yard Work;Church;Cleaning;Driving;Community Activity    Stability/Clinical Decision Making Evolving/Moderate complexity    Rehab Potential Good    Clinical Impairments Affecting Rehab Potential multipe comorbidities    PT Frequency 2x / week    PT Duration 12 weeks    PT Treatment/Interventions ADLs/Self Care Home Management;Electrical Stimulation;Moist Heat;Traction;Ultrasound;Gait training;Stair training;Functional mobility training;Therapeutic activities;Therapeutic exercise;Balance training;Neuromuscular re-education;Patient/family education;Manual techniques;Passive range of motion;Dry needling;Joint Manipulations;Cryotherapy;Vestibular;Canalith Repostioning    PT Next Visit Plan standing strengthening and balance, seated strengthening and coordination    PT Home Exercise Plan To be initiated next 1-2 visits    Consulted and Agree with Plan of Care Patient              Ollen Bowl, PT 03/18/2022, 8:38 AM

## 2022-03-21 ENCOUNTER — Ambulatory Visit: Payer: Medicare Other

## 2022-03-21 DIAGNOSIS — R278 Other lack of coordination: Secondary | ICD-10-CM

## 2022-03-21 DIAGNOSIS — M6281 Muscle weakness (generalized): Secondary | ICD-10-CM

## 2022-03-21 DIAGNOSIS — R269 Unspecified abnormalities of gait and mobility: Secondary | ICD-10-CM

## 2022-03-21 DIAGNOSIS — G8929 Other chronic pain: Secondary | ICD-10-CM

## 2022-03-21 DIAGNOSIS — R2689 Other abnormalities of gait and mobility: Secondary | ICD-10-CM

## 2022-03-21 DIAGNOSIS — R262 Difficulty in walking, not elsewhere classified: Secondary | ICD-10-CM

## 2022-03-21 DIAGNOSIS — R2681 Unsteadiness on feet: Secondary | ICD-10-CM

## 2022-03-21 NOTE — Therapy (Signed)
OUTPATIENT PHYSICAL THERAPY TREATMENT NOTE   Patient Name: Francisco [name] MRN: [id number] DOB:07-14-1946, 76 y.o., male Today's Date: 03/21/2022  PCP: Adin Hector, MD REFERRING PROVIDER: Meade Maw MD    PT End of Session - 03/21/22 1115     Visit Number 4    Number of Visits 25    Date for PT Re-Evaluation 05/18/22    Authorization Time Period 02/23/2022-05/18/2022    PT Start Time 1111    PT Stop Time 1144    PT Time Calculation (min) 33 min    Equipment Utilized During Treatment Gait belt    Activity Tolerance Patient tolerated treatment well    Behavior During Therapy WFL for tasks assessed/performed             Past Medical History:  Diagnosis Date   Arthritis    lower left hip   Atypical angina (HCC)    Bilateral hand numbness    from back surgery   Bronchitis, chronic ([place])    Cancer ([place])    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; Merkel cell cancer, and Basal cell cancer (twice; 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   Carotid stenosis    CHF (congestive heart failure) (HCC)    CKD (chronic kidney disease)    CKD (chronic kidney disease) stage 3, GFR 30-59 ml/min (HCC)    COPD (chronic obstructive pulmonary disease) ([place])    stage 2   DDD (degenerative disc disease), cervical    Dysrhythmia    post carotid stent bradycardia; PAF 09/2020   GERD (gastroesophageal reflux disease)    Hypercholesterolemia    Hypertension    Hypothyroidism    pt takes Levothyroxine daily   Lumbosacral spinal stenosis    Myasthenia gravis, adult form ([place])    PAD (peripheral artery disease) ([place])    Shortness of breath    Lung MD- Dr Darlin Coco   Sleep apnea    do not use CPAP every night   Past Surgical History:  Procedure Laterality Date   ANTERIOR CERVICAL DECOMP/DISCECTOMY FUSION  07/18/2011   Procedure: ANTERIOR CERVICAL DECOMPRESSION/DISCECTOMY FUSION 2 LEVELS;  Surgeon: Cooper Render Pool;  Location: [facility];  Service: Neurosurgery;  Laterality: N/A;  cervical five-six, cervical  six-seven anterior cervical discectomy and fusion   BACK SURGERY     in [place]     01/2020 Right, 04/2020 Left   CARDIAC CATHETERIZATION     2005 at [facility], no stents   CAROTID PTA/STENT INTERVENTION N/A 09/17/2020   Procedure: CAROTID PTA/STENT INTERVENTION;  Surgeon: Algernon Huxley, MD;  Location: [facility];  Service: Cardiovascular;  Laterality: N/A;   CATARACT EXTRACTION W/PHACO Left 01/06/2020   Procedure: CATARACT EXTRACTION PHACO AND INTRAOCULAR LENS PLACEMENT (IOC) ISTENT INJ LEFT 3.81  00:33.3;  Surgeon: Eulogio Bear, MD;  Location: [facility];  Service: Ophthalmology;  Laterality: Left;   CATARACT EXTRACTION W/PHACO Right 02/03/2020   Procedure: CATARACT EXTRACTION PHACO AND INTRAOCULAR LENS PLACEMENT ([place]) RIGHT ISTENT INJ;  Surgeon: Eulogio Bear, MD;  Location: [facility];  Service: Ophthalmology;  Laterality: Right;  4.29 0:35.6   COLONOSCOPY     HERNIA REPAIR Left    inguinal hernia repair in 1985   LUMBAR LAMINECTOMY/DECOMPRESSION MICRODISCECTOMY Left 02/24/2014   Procedure: LUMBAR LAMINECTOMY/DECOMPRESSION MICRODISCECTOMY LUMBAR THREE-FOUR, FOUR-FIVE, LEFT FIVE-SACRAL ONE ;  Surgeon: Charlie Pitter, MD;  Location: [facility];  Service: Neurosurgery;  Laterality: Left;  LUMBAR LAMINECTOMY/DECOMPRESSION MICRODISCECTOMY LUMBAR THREE-FOUR,  FOUR-FIVE, LEFT FIVE-SACRAL ONE    LUMBAR LAMINECTOMY/DECOMPRESSION MICRODISCECTOMY N/A 05/03/2021   Procedure: Laminectomy and Foraminotomy - L2-L3;  Surgeon: Earnie Larsson, MD;  Location: [facility];  Service: Neurosurgery;  Laterality: N/A;  3C   POSTERIOR CERVICAL FUSION/FORAMINOTOMY N/A 08/07/2020   Procedure: C3-6 POSTERIOR FUSION WITH DECOMPRESSION;  Surgeon: Meade Maw, MD;  Location: [facility];  Service: Neurosurgery;  Laterality: N/A;   PROSTATECTOMY  8/14   [facility] Dr Mare Ferrari    Patient Active Problem List   Diagnosis Date Noted   SVT (supraventricular  tachycardia) ([place]) 12/30/2021   Hypothyroidism 12/30/2021   Parkinson's disease ([place]) 12/30/2021   Elevated troponin 12/30/2021   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   Acquired thrombophilia ([place]) 01/13/2021   History of decompression of median nerve 01/01/2021   Paroxysmal atrial fibrillation ([place]) 10/06/2020   Carotid stenosis, right 09/17/2020   S/P cervical spinal fusion    Leukocytosis    Essential hypertension    Anemia of chronic disease    Postoperative pain    Neuropathic pain    Cervical myelopathy ([place]) 08/07/2020   Preop cardiovascular exam 07/17/2020   SOB (shortness of breath) on exertion 07/17/2020   Leg weakness, bilateral 07/13/2020   Aortic atherosclerosis ([place]) 07/09/2020   Body mass index (BMI) 34.0-34.9, adult 12/25/2019   Myalgia 10/30/2019   Lumbar post-laminectomy syndrome 10/24/2019   PAD (peripheral artery disease) ([place]) 06/06/2019   CKD (chronic kidney disease) stage 3, GFR 30-59 ml/min (HCC) 04/29/2019   B12 deficiency 01/23/2019   Left arm numbness 02/28/2018   Neck pain 02/28/2018   Anemia 02/02/2017   DDD (degenerative disc disease), cervical 02/02/2017   Hypothyroid 02/02/2017   MRSA (methicillin resistant staph aureus) culture positive 02/02/2017   Nocturnal hypoxia 02/02/2017   Senile purpura ([place]) 10/03/2016   Essential hypertension, benign 09/16/2016   Bilateral carotid artery disease ([place]) 09/16/2016   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/18/2016   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   History of prostate cancer 12/21/2015   Left carpal tunnel syndrome 11/11/2015   Kidney stone on left side 07/05/2015   Myasthenia gravis ([place]) 05/26/2015   Radiculitis 01/05/2015   Long-term use of high-risk medication 10/15/2014   Persistent cough 09/10/2014   Pure hypercholesterolemia 07/25/2014   Spinal stenosis, lumbar region, with neurogenic claudication 02/24/2014   Lumbosacral stenosis with neurogenic claudication  02/24/2014   COPD (chronic obstructive pulmonary disease) ([place]) 01/22/2014    REFERRING DIAG: balance disorder   THERAPY DIAG:  Abnormality of gait and mobility  Difficulty in walking, not elsewhere classified  Muscle weakness (generalized)  Other abnormalities of gait and mobility  Other lack of coordination  Unsteadiness on feet  Chronic bilateral low back pain, unspecified whether sciatica present  Rationale for Evaluation and Treatment Rehabilitation  PERTINENT HISTORY: Daimien Patmon is a 76 year old male referred to [facility] neuro for difficulty with balance. He was also just recently dx in May 2023 with Impingement syndrome of left shoulder (M75.42) Impingement syndrome of left shoulder (primary encounter diagnosis) Left shoulder pain, unspecified chronicity Nontraumatic rupture of left proximal biceps tendon Past medical hx includes Lumbar laminectomy 04/2021. C3-6 decompression, fusion on 11/26, myasthenia gravis, 2012 ACDF 5/6, 6/7; OSA, hypothyroidism, HTN, COPD, multiple back surgeries, 2021 carpal tunnel release.  PRECAUTIONS: Fall  SUBJECTIVE: Patient reports he is running behind today and states overall having good and bad days  PAIN:  Are you having pain? No     TODAY'S TREATMENT:     Therex:  Seated hip march- 3 lb alt LE - Very labored to achieve 10 reps.   Seated LAQ x 12 reps with 3lb AW  each LE  Seated Hip abd with 3lb with glider x12 reps each   Instructed patient in self stretching:  Seated hamstring stretch- hold 20 sec x 4  Seated lumbar flex -hold 20 sec x 4 Seated figure 4 stretch with use of towel to assist in obtaining optimal ROM- hold 20 sec x4 reps   PATIENT EDUCATION: Education details: HEP, body mechanics, BERG test Person educated: Patient Education method: Explanation, Demonstration, Tactile cues, Verbal cues, and Handouts Education comprehension: verbalized understanding, returned demonstration, verbal cues required, and  tactile cues required   HOME EXERCISE PROGRAM:  No Updates today  Access Code: [email or web address] URL: [email or web address] Date: 03/01/2022 Prepared by: Janna Arch  Exercises - Seated March  - 1 x daily - 7 x weekly - 2 sets - 10 reps - 5 hold - Seated Long Arc Quad  - 1 x daily - 7 x weekly - 2 sets - 10 reps - 5 hold - Seated Hip Abduction  - 1 x daily - 7 x weekly - 2 sets - 10 reps - 5 hold - Seated Hip Adduction Isometrics with Ball  - 1 x daily - 7 x weekly - 2 sets - 10 reps - 5 hold - Seated Gluteal Sets  - 1 x daily - 7 x weekly - 2 sets - 10 reps - 5 hold - Seated Heel Toe Raises  - 1 x daily - 7 x weekly - 2 sets - 10 reps - 5 hold LE strength and balance        03/01/22 Access Code: [email or web address] URL: [email or web address] Date: 03/01/2022 Prepared by: Janna Arch  Exercises - Seated March  - 1 x daily - 7 x weekly - 2 sets - 10 reps - 5 hold - Seated Long Arc Quad  - 1 x daily - 7 x weekly - 2 sets - 10 reps - 5 hold - Seated Hip Abduction  - 1 x daily - 7 x weekly - 2 sets - 10 reps - 5 hold - Seated Hip Adduction Isometrics with Ball  - 1 x daily - 7 x weekly - 2 sets - 10 reps - 5 hold - Seated Gluteal Sets  - 1 x daily - 7 x weekly - 2 sets - 10 reps - 5 hold - Seated Heel Toe Raises  - 1 x daily - 7 x weekly - 2 sets - 10 reps - 5 hold   PT Short Term Goals       PT SHORT TERM GOAL #1   Title Pt will be independent with initial HEP in order to improve strength and balance in order to decrease fall risk and improve function at home and work.    Baseline 02/23/2022- Patient reports not doing much as far as exercise in the home currently.    Time 6    Period Weeks    Status New    Target Date 04/06/22              PT Long Term Goals       PT LONG TERM GOAL #1   Title Pt will be independent with final HEP in order to improve strength and balance in order to decrease fall risk and improve function at home and work.    Baseline  02/23/2022= No formal HEP in place.    Time 12  Period Weeks    Status New    Target Date 05/18/22      PT LONG TERM GOAL #2   Title Pt will decrease 5TSTS by at least 8 seconds in order to demonstrate clinically significant improvement in LE strength.    Baseline 02/23/2022= 31.52 sec with B hands on knees    Time 12    Period Days    Status New    Target Date 05/18/22      PT LONG TERM GOAL #3   Title Pt will increase 10MWT by at least 0.23 m/s in order to demonstrate clinically significant improvement in community ambulation.    Baseline 02/23/2022= 0.39 m/s using RW    Time 12    Period Weeks    Status New    Target Date 05/18/22      PT LONG TERM GOAL #4   Title Pt will improve FOTO to target score of 50 to display perceived improvements in ability to complete ADL's.    Baseline 02/23/2022= 47    Time 12    Period Weeks    Status New    Target Date 05/18/22      PT LONG TERM GOAL #5   Title Pt will decrease TUG to below 20 seconds/decrease in order to demonstrate decreased fall risk.    Baseline 02/23/2022=28.27 sec with RW    Time 12    Period Weeks    Status New    Target Date 05/18/22              Plan -     Clinical Impression Statement Patient presents with increased weakness and fatigue today. Treatment limited secondary to patient late arrival. He was instructed in several LE stretches to promote more flexibility and was tight overall but able to achieve positions with VC and visual demonstration. Patient will benefit from skilled PT services to address these deficits, improve balance, and decrease risk for future falls.    Personal Factors and Comorbidities Age;Past/Current Experience;Time since onset of injury/illness/exacerbation;Comorbidity 3+    Comorbidities COPD, History of Cancer, myasthenia gravis, chronic lumbar surgical history    Examination-Activity Limitations Lift;Squat;Bend;Stairs;Stand;Transfers;Engineer, maintenance;Bathing;Hygiene/Grooming;Dressing;Toileting;Bed Mobility;Caring for Others;Continence    Examination-Participation Restrictions Yard Work;Church;Cleaning;Driving;Community Activity    Stability/Clinical Decision Making Evolving/Moderate complexity    Rehab Potential Good    Clinical Impairments Affecting Rehab Potential multipe comorbidities    PT Frequency 2x / week    PT Duration 12 weeks    PT Treatment/Interventions ADLs/Self Care Home Management;Electrical Stimulation;Moist Heat;Traction;Ultrasound;Gait training;Stair training;Functional mobility training;Therapeutic activities;Therapeutic exercise;Balance training;Neuromuscular re-education;Patient/family education;Manual techniques;Passive range of motion;Dry needling;Joint Manipulations;Cryotherapy;Vestibular;Canalith Repostioning    PT Next Visit Plan standing strengthening and balance, seated strengthening and coordination    PT Home Exercise Plan To be initiated next 1-2 visits    Consulted and Agree with Plan of Care Patient              Ollen Bowl, PT 03/21/2022, 2:34 PM

## 2022-03-24 ENCOUNTER — Ambulatory Visit: Payer: Medicare Other

## 2022-03-24 NOTE — Therapy (Deleted)
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   Carotid stenosis    CHF (congestive heart failure) (HCC)    CKD (chronic kidney disease)    CKD (chronic kidney disease) stage 3, GFR 30-59 ml/min (HCC)    COPD (chronic obstructive pulmonary disease) ([place])    stage 2   DDD (degenerative disc disease), cervical    Dysrhythmia    post carotid stent bradycardia; PAF 09/2020   GERD (gastroesophageal reflux disease)    Hypercholesterolemia    Hypertension    Hypothyroidism    pt takes Levothyroxine daily   Lumbosacral spinal stenosis    Myasthenia gravis, adult form ([place])    PAD (peripheral artery disease) ([place])    Shortness of breath    Lung MD- Dr Darlin Coco   Sleep apnea    do not use CPAP every night   Past Surgical History:  Procedure Laterality Date   ANTERIOR CERVICAL DECOMP/DISCECTOMY FUSION  07/18/2011   Procedure: ANTERIOR CERVICAL DECOMPRESSION/DISCECTOMY FUSION 2 LEVELS;  Surgeon: Cooper Render Pool;  Location: [place] [facility];  Service: Neurosurgery;  Laterality: N/A;  cervical five-six, cervical six-seven anterior cervical discectomy and fusion   BACK SURGERY     in [place]     01/2020 Right, 04/2020 Left   CARDIAC CATHETERIZATION     2005 at [facility], no stents   CAROTID PTA/STENT INTERVENTION N/A 09/17/2020   Procedure: CAROTID PTA/STENT INTERVENTION;  Surgeon: Algernon Huxley, MD;  Location: [place] [facility];  Service: Cardiovascular;  Laterality: N/A;   CATARACT  EXTRACTION W/PHACO Left 01/06/2020   Procedure: CATARACT EXTRACTION PHACO AND INTRAOCULAR LENS PLACEMENT (IOC) ISTENT INJ LEFT 3.81  00:33.3;  Surgeon: Eulogio Bear, MD;  Location: [place];  Service: Ophthalmology;  Laterality: Left;   CATARACT EXTRACTION W/PHACO Right 02/03/2020   Procedure: CATARACT EXTRACTION PHACO AND INTRAOCULAR LENS PLACEMENT ([place]) RIGHT ISTENT INJ;  Surgeon: Eulogio Bear, MD;  Location: [place];  Service: Ophthalmology;  Laterality: Right;  4.29 0:35.6   COLONOSCOPY     HERNIA REPAIR Left    inguinal hernia repair in 1985   LUMBAR LAMINECTOMY/DECOMPRESSION MICRODISCECTOMY Left 02/24/2014   Procedure: LUMBAR LAMINECTOMY/DECOMPRESSION MICRODISCECTOMY LUMBAR THREE-FOUR, FOUR-FIVE, LEFT FIVE-SACRAL ONE ;  Surgeon: Charlie Pitter, MD;  Location: [place] [facility];  Service: Neurosurgery;  Laterality: Left;  LUMBAR LAMINECTOMY/DECOMPRESSION MICRODISCECTOMY LUMBAR THREE-FOUR, FOUR-FIVE, LEFT FIVE-SACRAL ONE    LUMBAR LAMINECTOMY/DECOMPRESSION MICRODISCECTOMY N/A 05/03/2021   Procedure: Laminectomy and Foraminotomy - L2-L3;  Surgeon: Earnie Larsson, MD;  Location: [place];  Service: Neurosurgery;  Laterality: N/A;  3C   POSTERIOR CERVICAL FUSION/FORAMINOTOMY N/A 08/07/2020   Procedure: C3-6 POSTERIOR FUSION WITH DECOMPRESSION;  Surgeon: Meade Maw, MD;  Location: [facility];  Service: Neurosurgery;  Laterality: N/A;   PROSTATECTOMY  8/14   [facility] Dr Mare Ferrari    Patient Active Problem List   Diagnosis Date Noted   SVT (supraventricular tachycardia) ([place]) 12/30/2021  Hypothyroidism 12/30/2021   Parkinson's disease ([place]) 12/30/2021   Elevated troponin 12/30/2021   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   Acquired thrombophilia ([place]) 01/13/2021   History of decompression of median nerve 01/01/2021   Paroxysmal atrial fibrillation ([place]) 10/06/2020   Carotid stenosis, right 09/17/2020   S/P cervical spinal fusion    Leukocytosis     Essential hypertension    Anemia of chronic disease    Postoperative pain    Neuropathic pain    Cervical myelopathy ([place]) 08/07/2020   Preop cardiovascular exam 07/17/2020   SOB (shortness of breath) on exertion 07/17/2020   Leg weakness, bilateral 07/13/2020   Aortic atherosclerosis ([place]) 07/09/2020   Body mass index (BMI) 34.0-34.9, adult 12/25/2019   Myalgia 10/30/2019   Lumbar post-laminectomy syndrome 10/24/2019   PAD (peripheral artery disease) ([place]) 06/06/2019   CKD (chronic kidney disease) stage 3, GFR 30-59 ml/min (HCC) 04/29/2019   B12 deficiency 01/23/2019   Left arm numbness 02/28/2018   Neck pain 02/28/2018   Anemia 02/02/2017   DDD (degenerative disc disease), cervical 02/02/2017   Hypothyroid 02/02/2017   MRSA (methicillin resistant staph aureus) culture positive 02/02/2017   Nocturnal hypoxia 02/02/2017   Senile purpura ([place]) 10/03/2016   Essential hypertension, benign 09/16/2016   Bilateral carotid artery disease ([place]) 09/16/2016   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/18/2016   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  Rationale for Evaluation and Treatment Rehabilitation  PERTINENT HISTORY: Francisco [name] is a 76 year old male referred to [facility] neuro for difficulty with balance. He was also just recently dx in May 2023 with Impingement syndrome of left shoulder (M75.42) Impingement  syndrome of left shoulder (primary encounter diagnosis) Left shoulder pain, unspecified chronicity Nontraumatic rupture of left proximal biceps tendon Past medical hx includes Lumbar laminectomy 04/2021. C3-6 decompression, fusion on 11/26, myasthenia gravis, 2012 ACDF 5/6, 6/7; OSA, hypothyroidism, HTN, COPD, multiple back surgeries, 2021 carpal tunnel release.  PRECAUTIONS: Fall  SUBJECTIVE: Patient reports he is going to his brothers wake and will miss next session due to the death in his family.   PAIN:  Are you having pain? No     TODAY'S TREATMENT:  03/17/2022 Neuro Re-ed   TherEx: Interval Nustep with LE only- Seat at 10 L0-30 sec L2- 45 sec L3-45 sec  L4- 30 sec L0-30 sec L2- 30sec L4-29mn 30 sec L0-1 min  Step tap onto 1/2 foam roll x 15 reps with no UE support.  Step up and over 1/2 foam roll x 12 reps with min UE Support Calf raises (from 1/2 foam roll) x 15 reps BLE Seated Hip flex/up/over hedgehog x 12 reps BLE Seated hamstring BTB x 12 reps- Patient rated as hard.   PATIENT EDUCATION: Education details: HEP, bEconomist BERG test Person educated: Patient Education method: Explanation, Demonstration, Tactile cues, Verbal cues, and Handouts Education comprehension: verbalized understanding, returned demonstration, verbal cues required, and tactile cues required   HOME EXERCISE PROGRAM:  No Updates  today  Access Code: [email or web address] URL: [email or web address] Date: 03/01/2022 Prepared by: Janna Arch  Exercises - Seated March  - 1 x daily - 7 x weekly - 2 sets - 10 reps - 5 hold - Seated Long Arc Quad  - 1 x daily - 7 x weekly - 2 sets - 10 reps - 5 hold - Seated Hip Abduction  - 1 x daily - 7 x weekly - 2 sets - 10 reps - 5 hold - Seated Hip Adduction Isometrics with Ball  - 1 x daily - 7 x weekly - 2 sets - 10 reps - 5 hold - Seated Gluteal Sets  - 1 x daily - 7 x weekly - 2 sets - 10 reps - 5 hold - Seated Heel Toe Raises  - 1 x daily - 7  x weekly - 2 sets - 10 reps - 5 hold LE strength and balance        03/01/22 Access Code: [email or web address] URL: [email or web address] Date: 03/01/2022 Prepared by: Janna Arch  Exercises - Seated March  - 1 x daily - 7 x weekly - 2 sets - 10 reps - 5 hold - Seated Long Arc Quad  - 1 x daily - 7 x weekly - 2 sets - 10 reps - 5 hold - Seated Hip Abduction  - 1 x daily - 7 x weekly - 2 sets - 10 reps - 5 hold - Seated Hip Adduction Isometrics with Ball  - 1 x daily - 7 x weekly - 2 sets - 10 reps - 5 hold - Seated Gluteal Sets  - 1 x daily - 7 x weekly - 2 sets - 10 reps - 5 hold - Seated Heel Toe Raises  - 1 x daily - 7 x weekly - 2 sets - 10 reps - 5 hold   PT Short Term Goals       PT SHORT TERM GOAL #1   Title Pt will be independent with initial HEP in order to improve strength and balance in order to decrease fall risk and improve function at home and work.    Baseline 02/23/2022- Patient reports not doing much as far as exercise in the home currently.    Time 6    Period Weeks    Status New    Target Date 04/06/22              PT Long Term Goals       PT LONG TERM GOAL #1   Title Pt will be independent with final HEP in order to improve strength and balance in order to decrease fall risk and improve function at home and work.    Baseline 02/23/2022= No formal HEP in place.    Time 12    Period Weeks    Status New    Target Date 05/18/22      PT LONG TERM GOAL #2   Title Pt will decrease 5TSTS by at least 8 seconds in order to demonstrate clinically significant improvement in LE strength.    Baseline 02/23/2022= 31.52 sec with B hands on knees    Time 12    Period Days    Status New    Target Date 05/18/22      PT LONG TERM GOAL #3   Title Pt will increase 10MWT by at least 0.23 m/s in order to demonstrate clinically significant improvement in community ambulation.    Baseline 02/23/2022= 0.39 m/s using RW    Time 12  Period Weeks    Status New     Target Date 05/18/22      PT LONG TERM GOAL #4   Title Pt will improve FOTO to target score of 50 to display perceived improvements in ability to complete ADL's.    Baseline 02/23/2022= 47    Time 12    Period Weeks    Status New    Target Date 05/18/22      PT LONG TERM GOAL #5   Title Pt will decrease TUG to below 20 seconds/decrease in order to demonstrate decreased fall risk.    Baseline 02/23/2022=28.27 sec with RW    Time 12    Period Weeks    Status New    Target Date 05/18/22              Plan -     Clinical Impression Statement Continued with current plan of care as laid out in evaluation and recent prior sessions. Pt remains motivated to advance progress toward goals in order to maximize independence and safety at home. Pt requires high level assistance and cuing for completion of exercises in order to provide adequate level of stimulation and perturbation. Author allows pt as much opportunity as possible to perform independent righting strategies, only stepping in when pt is unable to prevent falling to floor. Pt closely monitored throughout session for safe vitals response and to maximize patient safety during interventions. Pt continues to demonstrate progress toward goals AEB progression of some interventions this date either in volume or intensity.    Personal Factors and Comorbidities Age;Past/Current Experience;Time since onset of injury/illness/exacerbation;Comorbidity 3+    Comorbidities COPD, History of Cancer, myasthenia gravis, chronic lumbar surgical history    Examination-Activity Limitations Lift;Squat;Bend;Stairs;Stand;Transfers;Insurance claims handler;Bathing;Hygiene/Grooming;Dressing;Toileting;Bed Mobility;Caring for Others;Continence    Examination-Participation Restrictions Yard Work;Church;Cleaning;Driving;Community Activity    Stability/Clinical Decision Making Evolving/Moderate complexity    Rehab Potential Good    Clinical Impairments  Affecting Rehab Potential multipe comorbidities    PT Frequency 2x / week    PT Duration 12 weeks    PT Treatment/Interventions ADLs/Self Care Home Management;Electrical Stimulation;Moist Heat;Traction;Ultrasound;Gait training;Stair training;Functional mobility training;Therapeutic activities;Therapeutic exercise;Balance training;Neuromuscular re-education;Patient/family education;Manual techniques;Passive range of motion;Dry needling;Joint Manipulations;Cryotherapy;Vestibular;Canalith Repostioning    PT Next Visit Plan standing strengthening and balance, seated strengthening and coordination    PT Home Exercise Plan To be initiated next 1-2 visits    Consulted and Agree with Plan of Care Patient              Rivka Barbara PT, DPT   03/24/2022, 9:05 AM

## 2022-03-24 NOTE — Therapy (Signed)
OUTPATIENT PHYSICAL THERAPY TREATMENT NOTE   Patient Name: Francisco [name] MRN: [id number] DOB:05-24-1946, 76 y.o., male Today's Date: 03/28/2022  PCP: Adin Hector, MD REFERRING PROVIDER: Meade Maw MD    PT End of Session - 03/28/22 1600     Visit Number 5    Number of Visits 25    Date for PT Re-Evaluation 05/18/22    Authorization Time Period 02/23/2022-05/18/2022    PT Start Time 1600    PT Stop Time 1644    PT Time Calculation (min) 44 min    Equipment Utilized During Treatment Gait belt    Activity Tolerance Patient tolerated treatment well    Behavior During Therapy WFL for tasks assessed/performed              Past Medical History:  Diagnosis Date   Arthritis    lower left hip   Atypical angina (HCC)    Bilateral hand numbness    from back surgery   Bronchitis, chronic ([place])    Cancer ([place])    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; Merkel cell cancer, and Basal cell cancer (twice; 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   Carotid stenosis    CHF (congestive heart failure) (HCC)    CKD (chronic kidney disease)    CKD (chronic kidney disease) stage 3, GFR 30-59 ml/min (HCC)    COPD (chronic obstructive pulmonary disease) ([place])    stage 2   DDD (degenerative disc disease), cervical    Dysrhythmia    post carotid stent bradycardia; PAF 09/2020   GERD (gastroesophageal reflux disease)    Hypercholesterolemia    Hypertension    Hypothyroidism    pt takes Levothyroxine daily   Lumbosacral spinal stenosis    Myasthenia gravis, adult form ([place])    PAD (peripheral artery disease) ([place])    Shortness of breath    Lung MD- Dr Darlin Coco   Sleep apnea    do not use CPAP every night   Past Surgical History:  Procedure Laterality Date   ANTERIOR CERVICAL DECOMP/DISCECTOMY FUSION  07/18/2011   Procedure: ANTERIOR CERVICAL DECOMPRESSION/DISCECTOMY FUSION 2 LEVELS;  Surgeon: Cooper Render Pool;  Location: [facility];  Service: Neurosurgery;  Laterality: N/A;  cervical five-six,  cervical six-seven anterior cervical discectomy and fusion   BACK SURGERY     in [place]     01/2020 Right, 04/2020 Left   CARDIAC CATHETERIZATION     2005 at [facility], no stents   CAROTID PTA/STENT INTERVENTION N/A 09/17/2020   Procedure: CAROTID PTA/STENT INTERVENTION;  Surgeon: Algernon Huxley, MD;  Location: [facility];  Service: Cardiovascular;  Laterality: N/A;   CATARACT EXTRACTION W/PHACO Left 01/06/2020   Procedure: CATARACT EXTRACTION PHACO AND INTRAOCULAR LENS PLACEMENT (IOC) ISTENT INJ LEFT 3.81  00:33.3;  Surgeon: Eulogio Bear, MD;  Location: [facility];  Service: Ophthalmology;  Laterality: Left;   CATARACT EXTRACTION W/PHACO Right 02/03/2020   Procedure: CATARACT EXTRACTION PHACO AND INTRAOCULAR LENS PLACEMENT ([place]) RIGHT ISTENT INJ;  Surgeon: Eulogio Bear, MD;  Location: [facility];  Service: Ophthalmology;  Laterality: Right;  4.29 0:35.6   COLONOSCOPY     HERNIA REPAIR Left    inguinal hernia repair in 1985   LUMBAR LAMINECTOMY/DECOMPRESSION MICRODISCECTOMY Left 02/24/2014   Procedure: LUMBAR LAMINECTOMY/DECOMPRESSION MICRODISCECTOMY LUMBAR THREE-FOUR, FOUR-FIVE, LEFT FIVE-SACRAL ONE ;  Surgeon: Charlie Pitter, MD;  Location: [facility];  Service: Neurosurgery;  Laterality: Left;  LUMBAR LAMINECTOMY/DECOMPRESSION MICRODISCECTOMY LUMBAR  THREE-FOUR, FOUR-FIVE, LEFT FIVE-SACRAL ONE    LUMBAR LAMINECTOMY/DECOMPRESSION MICRODISCECTOMY N/A 05/03/2021   Procedure: Laminectomy and Foraminotomy - L2-L3;  Surgeon: Earnie Larsson, MD;  Location: [facility];  Service: Neurosurgery;  Laterality: N/A;  3C   POSTERIOR CERVICAL FUSION/FORAMINOTOMY N/A 08/07/2020   Procedure: C3-6 POSTERIOR FUSION WITH DECOMPRESSION;  Surgeon: Meade Maw, MD;  Location: [facility];  Service: Neurosurgery;  Laterality: N/A;   PROSTATECTOMY  8/14   [facility] Dr Mare Ferrari    Patient Active Problem List   Diagnosis Date Noted   SVT  (supraventricular tachycardia) ([place]) 12/30/2021   Hypothyroidism 12/30/2021   Parkinson's disease ([place]) 12/30/2021   Elevated troponin 12/30/2021   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   Acquired thrombophilia ([place]) 01/13/2021   History of decompression of median nerve 01/01/2021   Paroxysmal atrial fibrillation ([place]) 10/06/2020   Carotid stenosis, right 09/17/2020   S/P cervical spinal fusion    Leukocytosis    Essential hypertension    Anemia of chronic disease    Postoperative pain    Neuropathic pain    Cervical myelopathy ([place]) 08/07/2020   Preop cardiovascular exam 07/17/2020   SOB (shortness of breath) on exertion 07/17/2020   Leg weakness, bilateral 07/13/2020   Aortic atherosclerosis ([place]) 07/09/2020   Body mass index (BMI) 34.0-34.9, adult 12/25/2019   Myalgia 10/30/2019   Lumbar post-laminectomy syndrome 10/24/2019   PAD (peripheral artery disease) ([place]) 06/06/2019   CKD (chronic kidney disease) stage 3, GFR 30-59 ml/min (HCC) 04/29/2019   B12 deficiency 01/23/2019   Left arm numbness 02/28/2018   Neck pain 02/28/2018   Anemia 02/02/2017   DDD (degenerative disc disease), cervical 02/02/2017   Hypothyroid 02/02/2017   MRSA (methicillin resistant staph aureus) culture positive 02/02/2017   Nocturnal hypoxia 02/02/2017   Senile purpura ([place]) 10/03/2016   Essential hypertension, benign 09/16/2016   Bilateral carotid artery disease ([place]) 09/16/2016   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/18/2016   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   History of prostate cancer 12/21/2015   Left carpal tunnel syndrome 11/11/2015   Kidney stone on left side 07/05/2015   Myasthenia gravis ([place]) 05/26/2015   Radiculitis 01/05/2015   Long-term use of high-risk medication 10/15/2014   Persistent cough 09/10/2014   Pure hypercholesterolemia 07/25/2014   Spinal stenosis, lumbar region, with neurogenic claudication 02/24/2014   Lumbosacral stenosis with neurogenic  claudication 02/24/2014   COPD (chronic obstructive pulmonary disease) ([place]) 01/22/2014    REFERRING DIAG: balance disorder   THERAPY DIAG:  Abnormality of gait and mobility  Difficulty in walking, not elsewhere classified  Muscle weakness (generalized)  Other abnormalities of gait and mobility  Rationale for Evaluation and Treatment Rehabilitation  PERTINENT HISTORY: Minoru Chap is a 76 year old male referred to [facility] neuro for difficulty with balance. He was also just recently dx in May 2023 with Impingement syndrome of left shoulder (M75.42) Impingement syndrome of left shoulder (primary encounter diagnosis) Left shoulder pain, unspecified chronicity Nontraumatic rupture of left proximal biceps tendon Past medical hx includes Lumbar laminectomy 04/2021. C3-6 decompression, fusion on 11/26, myasthenia gravis, 2012 ACDF 5/6, 6/7; OSA, hypothyroidism, HTN, COPD, multiple back surgeries, 2021 carpal tunnel release.  PRECAUTIONS: Fall  SUBJECTIVE: Patient reports feeling very light headed and almost passing out yesterday.   PAIN:  Are you having pain? No     TODAY'S TREATMENT:    BP: 152/82  Therex:   Seated hip march- 3 lb alt LE - Very labored to achieve 10 reps.   Seated LAQ  x 12 reps with 3lb AW  each LE; 3 second holds   Seated heel raise 12x   Seated abduction 15x   Instructed patient in self stretching:  Seated hamstring stretch- hold 20 sec x 2    Standing:  - marching 10x each LE heavy BUE support, patient reports "hard" -abduction 10x each LE; heavy BUE support  -hip extension 10x each LE; heavy BUE support  -static stand reaching and organizing alphabet order x3 minutes upper case; seated rest break and 3 minutes lower case    PATIENT EDUCATION: Education details: HEP, body mechanics, BERG test Person educated: Patient Education method: Explanation, Demonstration, Tactile cues, Verbal cues, and Handouts Education comprehension: verbalized  understanding, returned demonstration, verbal cues required, and tactile cues required   HOME EXERCISE PROGRAM:  No Updates today  Access Code: [email or web address] URL: [email or web address] Date: 03/01/2022 Prepared by: Janna Arch  Exercises - Seated March  - 1 x daily - 7 x weekly - 2 sets - 10 reps - 5 hold - Seated Long Arc Quad  - 1 x daily - 7 x weekly - 2 sets - 10 reps - 5 hold - Seated Hip Abduction  - 1 x daily - 7 x weekly - 2 sets - 10 reps - 5 hold - Seated Hip Adduction Isometrics with Ball  - 1 x daily - 7 x weekly - 2 sets - 10 reps - 5 hold - Seated Gluteal Sets  - 1 x daily - 7 x weekly - 2 sets - 10 reps - 5 hold - Seated Heel Toe Raises  - 1 x daily - 7 x weekly - 2 sets - 10 reps - 5 hold LE strength and balance        03/01/22 Access Code: [email or web address] URL: [email or web address] Date: 03/01/2022 Prepared by: Janna Arch  Exercises - Seated March  - 1 x daily - 7 x weekly - 2 sets - 10 reps - 5 hold - Seated Long Arc Quad  - 1 x daily - 7 x weekly - 2 sets - 10 reps - 5 hold - Seated Hip Abduction  - 1 x daily - 7 x weekly - 2 sets - 10 reps - 5 hold - Seated Hip Adduction Isometrics with Ball  - 1 x daily - 7 x weekly - 2 sets - 10 reps - 5 hold - Seated Gluteal Sets  - 1 x daily - 7 x weekly - 2 sets - 10 reps - 5 hold - Seated Heel Toe Raises  - 1 x daily - 7 x weekly - 2 sets - 10 reps - 5 hold   PT Short Term Goals       PT SHORT TERM GOAL #1   Title Pt will be independent with initial HEP in order to improve strength and balance in order to decrease fall risk and improve function at home and work.    Baseline 02/23/2022- Patient reports not doing much as far as exercise in the home currently.    Time 6    Period Weeks    Status New    Target Date 04/06/22              PT Long Term Goals       PT LONG TERM GOAL #1   Title Pt will be independent with final HEP in order to improve strength and balance in order to  decrease fall risk and improve function at home and work.    Baseline 02/23/2022=  No formal HEP in place.    Time 12    Period Weeks    Status New    Target Date 05/18/22      PT LONG TERM GOAL #2   Title Pt will decrease 5TSTS by at least 8 seconds in order to demonstrate clinically significant improvement in LE strength.    Baseline 02/23/2022= 31.52 sec with B hands on knees    Time 12    Period Days    Status New    Target Date 05/18/22      PT LONG TERM GOAL #3   Title Pt will increase 10MWT by at least 0.23 m/s in order to demonstrate clinically significant improvement in community ambulation.    Baseline 02/23/2022= 0.39 m/s using RW    Time 12    Period Weeks    Status New    Target Date 05/18/22      PT LONG TERM GOAL #4   Title Pt will improve FOTO to target score of 50 to display perceived improvements in ability to complete ADL's.    Baseline 02/23/2022= 47    Time 12    Period Weeks    Status New    Target Date 05/18/22      PT LONG TERM GOAL #5   Title Pt will decrease TUG to below 20 seconds/decrease in order to demonstrate decreased fall risk.    Baseline 02/23/2022=28.27 sec with RW    Time 12    Period Weeks    Status New    Target Date 05/18/22              Plan -     Clinical Impression Statement Patient is very fatigue with prolonged standing interventions. He requires intermittent rest breaks but remains very motivated despite fatigue. His LLE is challenging due to weakness resulting in limited stability during stance phase for standing interventions.  Patient will benefit from skilled PT services to address these deficits, improve balance, and decrease risk for future falls.    Personal Factors and Comorbidities Age;Past/Current Experience;Time since onset of injury/illness/exacerbation;Comorbidity 3+    Comorbidities COPD, History of Cancer, myasthenia gravis, chronic lumbar surgical history    Examination-Activity Limitations  Lift;Squat;Bend;Stairs;Stand;Transfers;Insurance claims handler;Bathing;Hygiene/Grooming;Dressing;Toileting;Bed Mobility;Caring for Others;Continence    Examination-Participation Restrictions Yard Work;Church;Cleaning;Driving;Community Activity    Stability/Clinical Decision Making Evolving/Moderate complexity    Rehab Potential Good    Clinical Impairments Affecting Rehab Potential multipe comorbidities    PT Frequency 2x / week    PT Duration 12 weeks    PT Treatment/Interventions ADLs/Self Care Home Management;Electrical Stimulation;Moist Heat;Traction;Ultrasound;Gait training;Stair training;Functional mobility training;Therapeutic activities;Therapeutic exercise;Balance training;Neuromuscular re-education;Patient/family education;Manual techniques;Passive range of motion;Dry needling;Joint Manipulations;Cryotherapy;Vestibular;Canalith Repostioning    PT Next Visit Plan standing strengthening and balance, seated strengthening and coordination    PT Home Exercise Plan To be initiated next 1-2 visits    Consulted and Agree with Plan of Care Patient             Janna Arch, PT, DPT  03/28/2022, 4:44 PM

## 2022-03-28 ENCOUNTER — Ambulatory Visit: Payer: Medicare Other

## 2022-03-28 DIAGNOSIS — R269 Unspecified abnormalities of gait and mobility: Secondary | ICD-10-CM

## 2022-03-28 DIAGNOSIS — R262 Difficulty in walking, not elsewhere classified: Secondary | ICD-10-CM

## 2022-03-28 DIAGNOSIS — M6281 Muscle weakness (generalized): Secondary | ICD-10-CM

## 2022-03-28 DIAGNOSIS — R2689 Other abnormalities of gait and mobility: Secondary | ICD-10-CM

## 2022-04-04 NOTE — Therapy (Signed)
OUTPATIENT PHYSICAL THERAPY TREATMENT NOTE   Patient Name: Francisco [name] MRN: [id number] DOB:30-Mar-1946, 76 y.o., male Today's Date: 04/05/2022  PCP: Adin Hector, MD REFERRING PROVIDER: Meade Maw MD    PT End of Session - 04/05/22 1644     Visit Number 6    Number of Visits 25    Date for PT Re-Evaluation 05/18/22    Authorization Time Period 02/23/2022-05/18/2022    PT Start Time 1645    PT Stop Time 1729    PT Time Calculation (min) 44 min    Equipment Utilized During Treatment Gait belt    Activity Tolerance Patient tolerated treatment well    Behavior During Therapy WFL for tasks assessed/performed               Past Medical History:  Diagnosis Date   Arthritis    lower left hip   Atypical angina (HCC)    Bilateral hand numbness    from back surgery   Bronchitis, chronic ([place])    Cancer ([place])    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; Merkel cell cancer, and Basal cell cancer (twice; 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   Carotid stenosis    CHF (congestive heart failure) (HCC)    CKD (chronic kidney disease)    CKD (chronic kidney disease) stage 3, GFR 30-59 ml/min (HCC)    COPD (chronic obstructive pulmonary disease) ([place])    stage 2   DDD (degenerative disc disease), cervical    Dysrhythmia    post carotid stent bradycardia; PAF 09/2020   GERD (gastroesophageal reflux disease)    Hypercholesterolemia    Hypertension    Hypothyroidism    pt takes Levothyroxine daily   Lumbosacral spinal stenosis    Myasthenia gravis, adult form ([place])    PAD (peripheral artery disease) ([place])    Shortness of breath    Lung MD- Dr Darlin Coco   Sleep apnea    do not use CPAP every night   Past Surgical History:  Procedure Laterality Date   ANTERIOR CERVICAL DECOMP/DISCECTOMY FUSION  07/18/2011   Procedure: ANTERIOR CERVICAL DECOMPRESSION/DISCECTOMY FUSION 2 LEVELS;  Surgeon: Cooper Render Pool;  Location: [facility];  Service: Neurosurgery;  Laterality: N/A;  cervical five-six,  cervical six-seven anterior cervical discectomy and fusion   BACK SURGERY     in [place]     01/2020 Right, 04/2020 Left   CARDIAC CATHETERIZATION     2005 at [facility], no stents   CAROTID PTA/STENT INTERVENTION N/A 09/17/2020   Procedure: CAROTID PTA/STENT INTERVENTION;  Surgeon: Algernon Huxley, MD;  Location: [facility];  Service: Cardiovascular;  Laterality: N/A;   CATARACT EXTRACTION W/PHACO Left 01/06/2020   Procedure: CATARACT EXTRACTION PHACO AND INTRAOCULAR LENS PLACEMENT (IOC) ISTENT INJ LEFT 3.81  00:33.3;  Surgeon: Eulogio Bear, MD;  Location: [facility];  Service: Ophthalmology;  Laterality: Left;   CATARACT EXTRACTION W/PHACO Right 02/03/2020   Procedure: CATARACT EXTRACTION PHACO AND INTRAOCULAR LENS PLACEMENT ([place]) RIGHT ISTENT INJ;  Surgeon: Eulogio Bear, MD;  Location: [facility];  Service: Ophthalmology;  Laterality: Right;  4.29 0:35.6   COLONOSCOPY     HERNIA REPAIR Left    inguinal hernia repair in 1985   LUMBAR LAMINECTOMY/DECOMPRESSION MICRODISCECTOMY Left 02/24/2014   Procedure: LUMBAR LAMINECTOMY/DECOMPRESSION MICRODISCECTOMY LUMBAR THREE-FOUR, FOUR-FIVE, LEFT FIVE-SACRAL ONE ;  Surgeon: Charlie Pitter, MD;  Location: [facility];  Service: Neurosurgery;  Laterality: Left;  LUMBAR LAMINECTOMY/DECOMPRESSION MICRODISCECTOMY  LUMBAR THREE-FOUR, FOUR-FIVE, LEFT FIVE-SACRAL ONE    LUMBAR LAMINECTOMY/DECOMPRESSION MICRODISCECTOMY N/A 05/03/2021   Procedure: Laminectomy and Foraminotomy - L2-L3;  Surgeon: Earnie Larsson, MD;  Location: [facility];  Service: Neurosurgery;  Laterality: N/A;  3C   POSTERIOR CERVICAL FUSION/FORAMINOTOMY N/A 08/07/2020   Procedure: C3-6 POSTERIOR FUSION WITH DECOMPRESSION;  Surgeon: Meade Maw, MD;  Location: [facility];  Service: Neurosurgery;  Laterality: N/A;   PROSTATECTOMY  8/14   [facility] Dr Mare Ferrari    Patient Active Problem List   Diagnosis Date Noted   SVT  (supraventricular tachycardia) ([place]) 12/30/2021   Hypothyroidism 12/30/2021   Parkinson's disease ([place]) 12/30/2021   Elevated troponin 12/30/2021   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   Acquired thrombophilia ([place]) 01/13/2021   History of decompression of median nerve 01/01/2021   Paroxysmal atrial fibrillation ([place]) 10/06/2020   Carotid stenosis, right 09/17/2020   S/P cervical spinal fusion    Leukocytosis    Essential hypertension    Anemia of chronic disease    Postoperative pain    Neuropathic pain    Cervical myelopathy ([employer]) 08/07/2020   Preop cardiovascular exam 07/17/2020   SOB (shortness of breath) on exertion 07/17/2020   Leg weakness, bilateral 07/13/2020   Aortic atherosclerosis ([place]) 07/09/2020   Body mass index (BMI) 34.0-34.9, adult 12/25/2019   Myalgia 10/30/2019   Lumbar post-laminectomy syndrome 10/24/2019   PAD (peripheral artery disease) ([place]) 06/06/2019   CKD (chronic kidney disease) stage 3, GFR 30-59 ml/min (HCC) 04/29/2019   B12 deficiency 01/23/2019   Left arm numbness 02/28/2018   Neck pain 02/28/2018   Anemia 02/02/2017   DDD (degenerative disc disease), cervical 02/02/2017   Hypothyroid 02/02/2017   MRSA (methicillin resistant staph aureus) culture positive 02/02/2017   Nocturnal hypoxia 02/02/2017   Senile purpura ([place]) 10/03/2016   Essential hypertension, benign 09/16/2016   Bilateral carotid artery disease ([place]) 09/16/2016   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/18/2016   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   History of prostate cancer 12/21/2015   Left carpal tunnel syndrome 11/11/2015   Kidney stone on left side 07/05/2015   Myasthenia gravis ([place]) 05/26/2015   Radiculitis 01/05/2015   Long-term use of high-risk medication 10/15/2014   Persistent cough 09/10/2014   Pure hypercholesterolemia 07/25/2014   Spinal stenosis, lumbar region, with neurogenic claudication 02/24/2014   Lumbosacral stenosis with neurogenic  claudication 02/24/2014   COPD (chronic obstructive pulmonary disease) ([place]) 01/22/2014    REFERRING DIAG: balance disorder   THERAPY DIAG:  Abnormality of gait and mobility  Difficulty in walking, not elsewhere classified  Muscle weakness (generalized)  Rationale for Evaluation and Treatment Rehabilitation  PERTINENT HISTORY: Dayvon Dax is a 76 year old male referred to [facility] neuro for difficulty with balance. He was also just recently dx in May 2023 with Impingement syndrome of left shoulder (M75.42) Impingement syndrome of left shoulder (primary encounter diagnosis) Left shoulder pain, unspecified chronicity Nontraumatic rupture of left proximal biceps tendon Past medical hx includes Lumbar laminectomy 04/2021. C3-6 decompression, fusion on 11/26, myasthenia gravis, 2012 ACDF 5/6, 6/7; OSA, hypothyroidism, HTN, COPD, multiple back surgeries, 2021 carpal tunnel release.  PRECAUTIONS: Fall  SUBJECTIVE: Patient reports compliance with HEP. No falls or LOB since last session.   PAIN:  Are you having pain? No     TODAY'S TREATMENT:     Therex:  Nustep Lvl 2 RPM>50 seat position 10; x4 minutes  Seated hip march 10x each LE  Seated adduction 15x 5 second holds   Seated  abduction into GTB 10x  15x   Instructed patient in self stretching:  Seated hamstring stretch- hold 20 sec x 2   Sit to stand 5x  Standing in // bars :  - marching 4x length of // bars ; BUE support  -side step 4x length of // bars  -backward walking 2x length of // bars  -4" step 8x each LE toe taps  -heel raise 10x each LE   PATIENT EDUCATION: Education details: HEP, body mechanics, BERG test Person educated: Patient Education method: Explanation, Demonstration, Tactile cues, Verbal cues, and Handouts Education comprehension: verbalized understanding, returned demonstration, verbal cues required, and tactile cues required   HOME EXERCISE PROGRAM:  No Updates today  Access Code:  [email or web address] URL: [email or web address] Date: 03/01/2022 Prepared by: Janna Arch  Exercises - Seated March  - 1 x daily - 7 x weekly - 2 sets - 10 reps - 5 hold - Seated Long Arc Quad  - 1 x daily - 7 x weekly - 2 sets - 10 reps - 5 hold - Seated Hip Abduction  - 1 x daily - 7 x weekly - 2 sets - 10 reps - 5 hold - Seated Hip Adduction Isometrics with Ball  - 1 x daily - 7 x weekly - 2 sets - 10 reps - 5 hold - Seated Gluteal Sets  - 1 x daily - 7 x weekly - 2 sets - 10 reps - 5 hold - Seated Heel Toe Raises  - 1 x daily - 7 x weekly - 2 sets - 10 reps - 5 hold LE strength and balance        03/01/22 Access Code: [email or web address] URL: [email or web address] Date: 03/01/2022 Prepared by: Janna Arch  Exercises - Seated March  - 1 x daily - 7 x weekly - 2 sets - 10 reps - 5 hold - Seated Long Arc Quad  - 1 x daily - 7 x weekly - 2 sets - 10 reps - 5 hold - Seated Hip Abduction  - 1 x daily - 7 x weekly - 2 sets - 10 reps - 5 hold - Seated Hip Adduction Isometrics with Ball  - 1 x daily - 7 x weekly - 2 sets - 10 reps - 5 hold - Seated Gluteal Sets  - 1 x daily - 7 x weekly - 2 sets - 10 reps - 5 hold - Seated Heel Toe Raises  - 1 x daily - 7 x weekly - 2 sets - 10 reps - 5 hold   PT Short Term Goals       PT SHORT TERM GOAL #1   Title Pt will be independent with initial HEP in order to improve strength and balance in order to decrease fall risk and improve function at home and work.    Baseline 02/23/2022- Patient reports not doing much as far as exercise in the home currently.    Time 6    Period Weeks    Status New    Target Date 04/06/22              PT Long Term Goals       PT LONG TERM GOAL #1   Title Pt will be independent with final HEP in order to improve strength and balance in order to decrease fall risk and improve function at home and work.    Baseline 02/23/2022= No formal HEP in place.    Time 12    Period Weeks  Status New     Target Date 05/18/22      PT LONG TERM GOAL #2   Title Pt will decrease 5TSTS by at least 8 seconds in order to demonstrate clinically significant improvement in LE strength.    Baseline 02/23/2022= 31.52 sec with B hands on knees    Time 12    Period Days    Status New    Target Date 05/18/22      PT LONG TERM GOAL #3   Title Pt will increase 10MWT by at least 0.23 m/s in order to demonstrate clinically significant improvement in community ambulation.    Baseline 02/23/2022= 0.39 m/s using RW    Time 12    Period Weeks    Status New    Target Date 05/18/22      PT LONG TERM GOAL #4   Title Pt will improve FOTO to target score of 50 to display perceived improvements in ability to complete ADL's.    Baseline 02/23/2022= 47    Time 12    Period Weeks    Status New    Target Date 05/18/22      PT LONG TERM GOAL #5   Title Pt will decrease TUG to below 20 seconds/decrease in order to demonstrate decreased fall risk.    Baseline 02/23/2022=28.27 sec with RW    Time 12    Period Weeks    Status New    Target Date 05/18/22              Plan -     Clinical Impression Statement Patient presents with excellent motivation throughout physical therapy session. He is able to tolerate progressive standing strengthening interventions with intermittent rest breaks.  Lateral stepping is easier for patient than flexion. Patient will benefit from skilled PT services to address these deficits, improve balance, and decrease risk for future falls.    Personal Factors and Comorbidities Age;Past/Current Experience;Time since onset of injury/illness/exacerbation;Comorbidity 3+    Comorbidities COPD, History of Cancer, myasthenia gravis, chronic lumbar surgical history    Examination-Activity Limitations Lift;Squat;Bend;Stairs;Stand;Transfers;Insurance claims handler;Bathing;Hygiene/Grooming;Dressing;Toileting;Bed Mobility;Caring for Others;Continence    Examination-Participation  Restrictions Yard Work;Church;Cleaning;Driving;Community Activity    Stability/Clinical Decision Making Evolving/Moderate complexity    Rehab Potential Good    Clinical Impairments Affecting Rehab Potential multipe comorbidities    PT Frequency 2x / week    PT Duration 12 weeks    PT Treatment/Interventions ADLs/Self Care Home Management;Electrical Stimulation;Moist Heat;Traction;Ultrasound;Gait training;Stair training;Functional mobility training;Therapeutic activities;Therapeutic exercise;Balance training;Neuromuscular re-education;Patient/family education;Manual techniques;Passive range of motion;Dry needling;Joint Manipulations;Cryotherapy;Vestibular;Canalith Repostioning    PT Next Visit Plan standing strengthening and balance, seated strengthening and coordination    PT Home Exercise Plan To be initiated next 1-2 visits    Consulted and Agree with Plan of Care Patient             Janna Arch, PT, DPT  04/05/2022, 5:31 PM

## 2022-04-05 ENCOUNTER — Ambulatory Visit: Payer: Medicare Other

## 2022-04-05 DIAGNOSIS — R262 Difficulty in walking, not elsewhere classified: Secondary | ICD-10-CM

## 2022-04-05 DIAGNOSIS — R269 Unspecified abnormalities of gait and mobility: Secondary | ICD-10-CM

## 2022-04-05 DIAGNOSIS — M6281 Muscle weakness (generalized): Secondary | ICD-10-CM

## 2022-04-06 ENCOUNTER — Ambulatory Visit: Payer: Medicare Other | Admitting: Physical Therapy

## 2022-04-06 ENCOUNTER — Encounter: Payer: Self-pay | Admitting: Physical Therapy

## 2022-04-06 DIAGNOSIS — R262 Difficulty in walking, not elsewhere classified: Secondary | ICD-10-CM

## 2022-04-06 DIAGNOSIS — M6281 Muscle weakness (generalized): Secondary | ICD-10-CM

## 2022-04-06 DIAGNOSIS — R269 Unspecified abnormalities of gait and mobility: Secondary | ICD-10-CM

## 2022-04-06 DIAGNOSIS — R2681 Unsteadiness on feet: Secondary | ICD-10-CM

## 2022-04-06 NOTE — Therapy (Signed)
OUTPATIENT PHYSICAL THERAPY TREATMENT NOTE   Patient Name: Francisco [name] MRN: [id number] DOB:05/13/1946, 76 y.o., male Today's Date: 04/06/2022  PCP: Adin Hector, MD REFERRING PROVIDER: Meade Maw MD    PT End of Session - 04/06/22 1433     Visit Number 7    Number of Visits 25    Date for PT Re-Evaluation 05/18/22    Authorization Time Period 02/23/2022-05/18/2022    PT Start Time 1345    PT Stop Time 6629    PT Time Calculation (min) 43 min    Equipment Utilized During Treatment Gait belt    Activity Tolerance Patient tolerated treatment well    Behavior During Therapy WFL for tasks assessed/performed                Past Medical History:  Diagnosis Date   Arthritis    lower left hip   Atypical angina (HCC)    Bilateral hand numbness    from back surgery   Bronchitis, chronic ([place])    Cancer ([place])    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; Merkel cell cancer, and Basal cell cancer (twice; 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   Carotid stenosis    CHF (congestive heart failure) (HCC)    CKD (chronic kidney disease)    CKD (chronic kidney disease) stage 3, GFR 30-59 ml/min (HCC)    COPD (chronic obstructive pulmonary disease) ([place])    stage 2   DDD (degenerative disc disease), cervical    Dysrhythmia    post carotid stent bradycardia; PAF 09/2020   GERD (gastroesophageal reflux disease)    Hypercholesterolemia    Hypertension    Hypothyroidism    pt takes Levothyroxine daily   Lumbosacral spinal stenosis    Myasthenia gravis, adult form ([place])    PAD (peripheral artery disease) ([place])    Shortness of breath    Lung MD- Dr Darlin Coco   Sleep apnea    do not use CPAP every night   Past Surgical History:  Procedure Laterality Date   ANTERIOR CERVICAL DECOMP/DISCECTOMY FUSION  07/18/2011   Procedure: ANTERIOR CERVICAL DECOMPRESSION/DISCECTOMY FUSION 2 LEVELS;  Surgeon: Cooper Render Pool;  Location: [place] [facility];  Service: Neurosurgery;  Laterality: N/A;  cervical five-six,  cervical six-seven anterior cervical discectomy and fusion   BACK SURGERY     in [place]     01/2020 Right, 04/2020 Left   CARDIAC CATHETERIZATION     2005 at [facility], no stents   CAROTID PTA/STENT INTERVENTION N/A 09/17/2020   Procedure: CAROTID PTA/STENT INTERVENTION;  Surgeon: Algernon Huxley, MD;  Location: [place] [facility];  Service: Cardiovascular;  Laterality: N/A;   CATARACT EXTRACTION W/PHACO Left 01/06/2020   Procedure: CATARACT EXTRACTION PHACO AND INTRAOCULAR LENS PLACEMENT (IOC) ISTENT INJ LEFT 3.81  00:33.3;  Surgeon: Eulogio Bear, MD;  Location: [place];  Service: Ophthalmology;  Laterality: Left;   CATARACT EXTRACTION W/PHACO Right 02/03/2020   Procedure: CATARACT EXTRACTION PHACO AND INTRAOCULAR LENS PLACEMENT ([place]) RIGHT ISTENT INJ;  Surgeon: Eulogio Bear, MD;  Location: [facility];  Service: Ophthalmology;  Laterality: Right;  4.29 0:35.6   COLONOSCOPY     HERNIA REPAIR Left    inguinal hernia repair in 1985   LUMBAR LAMINECTOMY/DECOMPRESSION MICRODISCECTOMY Left 02/24/2014   Procedure: LUMBAR LAMINECTOMY/DECOMPRESSION MICRODISCECTOMY LUMBAR THREE-FOUR, FOUR-FIVE, LEFT FIVE-SACRAL ONE ;  Surgeon: Charlie Pitter, MD;  Location: [facility];  Service: Neurosurgery;  Laterality: Left;  LUMBAR LAMINECTOMY/DECOMPRESSION  MICRODISCECTOMY LUMBAR THREE-FOUR, FOUR-FIVE, LEFT FIVE-SACRAL ONE    LUMBAR LAMINECTOMY/DECOMPRESSION MICRODISCECTOMY N/A 05/03/2021   Procedure: Laminectomy and Foraminotomy - L2-L3;  Surgeon: Earnie Larsson, MD;  Location: [place];  Service: Neurosurgery;  Laterality: N/A;  3C   POSTERIOR CERVICAL FUSION/FORAMINOTOMY N/A 08/07/2020   Procedure: C3-6 POSTERIOR FUSION WITH DECOMPRESSION;  Surgeon: Meade Maw, MD;  Location: [facility];  Service: Neurosurgery;  Laterality: N/A;   PROSTATECTOMY  8/14   [facility] Dr Mare Ferrari    Patient Active Problem List   Diagnosis Date Noted   SVT  (supraventricular tachycardia) ([place]) 12/30/2021   Hypothyroidism 12/30/2021   Parkinson's disease ([place]) 12/30/2021   Elevated troponin 12/30/2021   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   Acquired thrombophilia ([place]) 01/13/2021   History of decompression of median nerve 01/01/2021   Paroxysmal atrial fibrillation ([place]) 10/06/2020   Carotid stenosis, right 09/17/2020   S/P cervical spinal fusion    Leukocytosis    Essential hypertension    Anemia of chronic disease    Postoperative pain    Neuropathic pain    Cervical myelopathy ([place]) 08/07/2020   Preop cardiovascular exam 07/17/2020   SOB (shortness of breath) on exertion 07/17/2020   Leg weakness, bilateral 07/13/2020   Aortic atherosclerosis ([place]) 07/09/2020   Body mass index (BMI) 34.0-34.9, adult 12/25/2019   Myalgia 10/30/2019   Lumbar post-laminectomy syndrome 10/24/2019   PAD (peripheral artery disease) ([place]) 06/06/2019   CKD (chronic kidney disease) stage 3, GFR 30-59 ml/min (HCC) 04/29/2019   B12 deficiency 01/23/2019   Left arm numbness 02/28/2018   Neck pain 02/28/2018   Anemia 02/02/2017   DDD (degenerative disc disease), cervical 02/02/2017   Hypothyroid 02/02/2017   MRSA (methicillin resistant staph aureus) culture positive 02/02/2017   Nocturnal hypoxia 02/02/2017   Senile purpura ([place]) 10/03/2016   Essential hypertension, benign 09/16/2016   Bilateral carotid artery disease ([place]) 09/16/2016   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/18/2016   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   History of prostate cancer 12/21/2015   Left carpal tunnel syndrome 11/11/2015   Kidney stone on left side 07/05/2015   Myasthenia gravis ([place]) 05/26/2015   Radiculitis 01/05/2015   Long-term use of high-risk medication 10/15/2014   Persistent cough 09/10/2014   Pure hypercholesterolemia 07/25/2014   Spinal stenosis, lumbar region, with neurogenic claudication 02/24/2014   Lumbosacral stenosis with neurogenic  claudication 02/24/2014   COPD (chronic obstructive pulmonary disease) ([place]) 01/22/2014    REFERRING DIAG: balance disorder   THERAPY DIAG:  Abnormality of gait and mobility  Difficulty in walking, not elsewhere classified  Muscle weakness (generalized)  Unsteadiness on feet  Rationale for Evaluation and Treatment Rehabilitation  PERTINENT HISTORY: Chadd Tollison is a 76 year old male referred to [facility] neuro for difficulty with balance. He was also just recently dx in May 2023 with Impingement syndrome of left shoulder (M75.42) Impingement syndrome of left shoulder (primary encounter diagnosis) Left shoulder pain, unspecified chronicity Nontraumatic rupture of left proximal biceps tendon Past medical hx includes Lumbar laminectomy 04/2021. C3-6 decompression, fusion on 11/26, myasthenia gravis, 2012 ACDF 5/6, 6/7; OSA, hypothyroidism, HTN, COPD, multiple back surgeries, 2021 carpal tunnel release.  PRECAUTIONS: Fall  SUBJECTIVE:   PAIN:  Are you having pain? No     TODAY'S TREATMENT:     Therex:  Nustep Lvl 2 RPM>50 seat position 9; x3 minutes level 2, x 3 min level 3  LAQ 2# Aw x 10 ea LE   Seated hip march 10x each LE with  2 # AW   Seated HS curl RTB x 10 ea LE   Seated adduction 2 x 6 x 5 second holds seated on dyna disc to work on postural control ( noted postural deficits with LAQ and seated march exercise)   Seated abduction into Blue TB 2*10 reps     Standing in // bars :  - marching 3x length of // bars ; BUE support  -backward walking 3x length of // bars -heel raise 2*10 x each LE   -knee flexion compensation on last set and reps   Ladder exercises:  Forward walking 6 lengths of // pars - sidestepping 3 x ea direction in length of // bar   Pt required occasional rest breaks due fatigue, PT was quick to ask when pt appeared to be fatiguing in order to prevent excessive fatigue. Pt educated throughout session about proper posture and technique with  exercises. Improved exercise technique, movement at target joints, use of target muscles after min to mod verbal, visual, tactile cues.   PATIENT EDUCATION: Education details: HEP, Economist, BERG test Person educated: Patient Education method: Explanation, Demonstration, Tactile cues, Verbal cues, and Handouts Education comprehension: verbalized understanding, returned demonstration, verbal cues required, and tactile cues required   HOME EXERCISE PROGRAM:  No Updates today  Access Code: [email or web address] URL: [email or web address] Date: 03/01/2022 Prepared by: Janna Arch  Exercises - Seated March  - 1 x daily - 7 x weekly - 2 sets - 10 reps - 5 hold - Seated Long Arc Quad  - 1 x daily - 7 x weekly - 2 sets - 10 reps - 5 hold - Seated Hip Abduction  - 1 x daily - 7 x weekly - 2 sets - 10 reps - 5 hold - Seated Hip Adduction Isometrics with Ball  - 1 x daily - 7 x weekly - 2 sets - 10 reps - 5 hold - Seated Gluteal Sets  - 1 x daily - 7 x weekly - 2 sets - 10 reps - 5 hold - Seated Heel Toe Raises  - 1 x daily - 7 x weekly - 2 sets - 10 reps - 5 hold LE strength and balance         PT Short Term Goals       PT SHORT TERM GOAL #1   Title Pt will be independent with initial HEP in order to improve strength and balance in order to decrease fall risk and improve function at home and work.    Baseline 02/23/2022- Patient reports not doing much as far as exercise in the home currently.    Time 6    Period Weeks    Status New    Target Date 04/06/22              PT Long Term Goals       PT LONG TERM GOAL #1   Title Pt will be independent with final HEP in order to improve strength and balance in order to decrease fall risk and improve function at home and work.    Baseline 02/23/2022= No formal HEP in place.    Time 12    Period Weeks    Status New    Target Date 05/18/22      PT LONG TERM GOAL #2   Title Pt will decrease 5TSTS by at least 8 seconds in  order to demonstrate clinically significant improvement in LE strength.    Baseline 02/23/2022= 31.52 sec with B hands  on knees    Time 12    Period Days    Status New    Target Date 05/18/22      PT LONG TERM GOAL #3   Title Pt will increase 10MWT by at least 0.23 m/s in order to demonstrate clinically significant improvement in community ambulation.    Baseline 02/23/2022= 0.39 m/s using RW    Time 12    Period Weeks    Status New    Target Date 05/18/22      PT LONG TERM GOAL #4   Title Pt will improve FOTO to target score of 50 to display perceived improvements in ability to complete ADL's.    Baseline 02/23/2022= 47    Time 12    Period Weeks    Status New    Target Date 05/18/22      PT LONG TERM GOAL #5   Title Pt will decrease TUG to below 20 seconds/decrease in order to demonstrate decreased fall risk.    Baseline 02/23/2022=28.27 sec with RW    Time 12    Period Weeks    Status New    Target Date 05/18/22              Plan -     Clinical Impression Statement Continued with current plan of care as laid out in evaluation and recent prior sessions. Pt remains motivated to advance progress toward goals in order to maximize independence and safety at home. Pt requires high level assistance and cuing for completion of exercises in order to provide adequate level of stimulation challenge while minimizing pain and discomfort when possible. Pt closely monitored throughout session pt response and to maximize patient safety during interventions. Pt continues to fatigue quickly with exercises, he did perform ladder drill/ exercises well this date. Pt continues to demonstrate progress toward goals AEB progression of interventions this date either in volume or intensity.     Personal Factors and Comorbidities Age;Past/Current Experience;Time since onset of injury/illness/exacerbation;Comorbidity 3+    Comorbidities COPD, History of Cancer, myasthenia gravis, chronic lumbar surgical  history    Examination-Activity Limitations Lift;Squat;Bend;Stairs;Stand;Transfers;Insurance claims handler;Bathing;Hygiene/Grooming;Dressing;Toileting;Bed Mobility;Caring for Others;Continence    Examination-Participation Restrictions Yard Work;Church;Cleaning;Driving;Community Activity    Stability/Clinical Decision Making Evolving/Moderate complexity    Rehab Potential Good    Clinical Impairments Affecting Rehab Potential multipe comorbidities    PT Frequency 2x / week    PT Duration 12 weeks    PT Treatment/Interventions ADLs/Self Care Home Management;Electrical Stimulation;Moist Heat;Traction;Ultrasound;Gait training;Stair training;Functional mobility training;Therapeutic activities;Therapeutic exercise;Balance training;Neuromuscular re-education;Patient/family education;Manual techniques;Passive range of motion;Dry needling;Joint Manipulations;Cryotherapy;Vestibular;Canalith Repostioning    PT Next Visit Plan standing strengthening and balance, seated strengthening and coordination    PT Home Exercise Plan To be initiated next 1-2 visits    Consulted and Agree with Plan of Care Patient            Particia Lather PT   04/06/2022, 2:34 PM

## 2022-04-13 ENCOUNTER — Ambulatory Visit: Payer: Medicare Other | Attending: Neurosurgery

## 2022-04-13 DIAGNOSIS — G8929 Other chronic pain: Secondary | ICD-10-CM | POA: Insufficient documentation

## 2022-04-13 DIAGNOSIS — R269 Unspecified abnormalities of gait and mobility: Secondary | ICD-10-CM | POA: Insufficient documentation

## 2022-04-13 DIAGNOSIS — R278 Other lack of coordination: Secondary | ICD-10-CM | POA: Insufficient documentation

## 2022-04-13 DIAGNOSIS — G959 Disease of spinal cord, unspecified: Secondary | ICD-10-CM | POA: Diagnosis present

## 2022-04-13 DIAGNOSIS — R2689 Other abnormalities of gait and mobility: Secondary | ICD-10-CM | POA: Insufficient documentation

## 2022-04-13 DIAGNOSIS — M545 Low back pain, unspecified: Secondary | ICD-10-CM | POA: Diagnosis present

## 2022-04-13 DIAGNOSIS — M6281 Muscle weakness (generalized): Secondary | ICD-10-CM | POA: Insufficient documentation

## 2022-04-13 DIAGNOSIS — R296 Repeated falls: Secondary | ICD-10-CM | POA: Diagnosis present

## 2022-04-13 DIAGNOSIS — R2681 Unsteadiness on feet: Secondary | ICD-10-CM | POA: Diagnosis present

## 2022-04-13 DIAGNOSIS — R262 Difficulty in walking, not elsewhere classified: Secondary | ICD-10-CM | POA: Insufficient documentation

## 2022-04-13 NOTE — Therapy (Signed)
OUTPATIENT PHYSICAL THERAPY TREATMENT NOTE   Patient Name: Francisco [name] MRN: [id number] DOB:Jun 12, 1946, 76 y.o., male Today's Date: 04/14/2022  PCP: Adin Hector, MD REFERRING PROVIDER: Meade Maw MD    PT End of Session - 04/13/22 1345     Visit Number 8    Number of Visits 25    Date for PT Re-Evaluation 05/18/22    Authorization Time Period 02/23/2022-05/18/2022    PT Start Time 1345    PT Stop Time 1540    PT Time Calculation (min) 43 min    Equipment Utilized During Treatment Gait belt    Activity Tolerance Patient tolerated treatment well    Behavior During Therapy WFL for tasks assessed/performed                 Past Medical History:  Diagnosis Date   Arthritis    lower left hip   Atypical angina (HCC)    Bilateral hand numbness    from back surgery   Bronchitis, chronic ([place])    Cancer ([place])    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; Merkel cell cancer, and Basal cell cancer (twice; 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   Carotid stenosis    CHF (congestive heart failure) (HCC)    CKD (chronic kidney disease)    CKD (chronic kidney disease) stage 3, GFR 30-59 ml/min (HCC)    COPD (chronic obstructive pulmonary disease) ([place])    stage 2   DDD (degenerative disc disease), cervical    Dysrhythmia    post carotid stent bradycardia; PAF 09/2020   GERD (gastroesophageal reflux disease)    Hypercholesterolemia    Hypertension    Hypothyroidism    pt takes Levothyroxine daily   Lumbosacral spinal stenosis    Myasthenia gravis, adult form ([place])    PAD (peripheral artery disease) ([place])    Shortness of breath    Lung MD- Dr Darlin Coco   Sleep apnea    do not use CPAP every night   Past Surgical History:  Procedure Laterality Date   ANTERIOR CERVICAL DECOMP/DISCECTOMY FUSION  07/18/2011   Procedure: ANTERIOR CERVICAL DECOMPRESSION/DISCECTOMY FUSION 2 LEVELS;  Surgeon: Cooper Render Pool;  Location: [place] [facility];  Service: Neurosurgery;  Laterality: N/A;  cervical five-six,  cervical six-seven anterior cervical discectomy and fusion   BACK SURGERY     in [place]     01/2020 Right, 04/2020 Left   CARDIAC CATHETERIZATION     2005 at [facility], no stents   CAROTID PTA/STENT INTERVENTION N/A 09/17/2020   Procedure: CAROTID PTA/STENT INTERVENTION;  Surgeon: Algernon Huxley, MD;  Location: [facility];  Service: Cardiovascular;  Laterality: N/A;   CATARACT EXTRACTION W/PHACO Left 01/06/2020   Procedure: CATARACT EXTRACTION PHACO AND INTRAOCULAR LENS PLACEMENT (IOC) ISTENT INJ LEFT 3.81  00:33.3;  Surgeon: Eulogio Bear, MD;  Location: [place];  Service: Ophthalmology;  Laterality: Left;   CATARACT EXTRACTION W/PHACO Right 02/03/2020   Procedure: CATARACT EXTRACTION PHACO AND INTRAOCULAR LENS PLACEMENT ([place]) RIGHT ISTENT INJ;  Surgeon: Eulogio Bear, MD;  Location: [place] [facility];  Service: Ophthalmology;  Laterality: Right;  4.29 0:35.6   COLONOSCOPY     HERNIA REPAIR Left    inguinal hernia repair in 1985   LUMBAR LAMINECTOMY/DECOMPRESSION MICRODISCECTOMY Left 02/24/2014   Procedure: LUMBAR LAMINECTOMY/DECOMPRESSION MICRODISCECTOMY LUMBAR THREE-FOUR, FOUR-FIVE, LEFT FIVE-SACRAL ONE ;  Surgeon: Charlie Pitter, MD;  Location: [place] [facility];  Service: Neurosurgery;  Laterality: Left;  LUMBAR  LAMINECTOMY/DECOMPRESSION MICRODISCECTOMY LUMBAR THREE-FOUR, FOUR-FIVE, LEFT FIVE-SACRAL ONE    LUMBAR LAMINECTOMY/DECOMPRESSION MICRODISCECTOMY N/A 05/03/2021   Procedure: Laminectomy and Foraminotomy - L2-L3;  Surgeon: Earnie Larsson, MD;  Location: [facility];  Service: Neurosurgery;  Laterality: N/A;  3C   POSTERIOR CERVICAL FUSION/FORAMINOTOMY N/A 08/07/2020   Procedure: C3-6 POSTERIOR FUSION WITH DECOMPRESSION;  Surgeon: Meade Maw, MD;  Location: [facility];  Service: Neurosurgery;  Laterality: N/A;   PROSTATECTOMY  8/14   [facility] Dr Mare Ferrari    Patient Active Problem List   Diagnosis Date Noted   SVT  (supraventricular tachycardia) ([place]) 12/30/2021   Hypothyroidism 12/30/2021   Parkinson's disease ([place]) 12/30/2021   Elevated troponin 12/30/2021   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   Acquired thrombophilia ([place]) 01/13/2021   History of decompression of median nerve 01/01/2021   Paroxysmal atrial fibrillation ([place]) 10/06/2020   Carotid stenosis, right 09/17/2020   S/P cervical spinal fusion    Leukocytosis    Essential hypertension    Anemia of chronic disease    Postoperative pain    Neuropathic pain    Cervical myelopathy ([place]) 08/07/2020   Preop cardiovascular exam 07/17/2020   SOB (shortness of breath) on exertion 07/17/2020   Leg weakness, bilateral 07/13/2020   Aortic atherosclerosis ([place]) 07/09/2020   Body mass index (BMI) 34.0-34.9, adult 12/25/2019   Myalgia 10/30/2019   Lumbar post-laminectomy syndrome 10/24/2019   PAD (peripheral artery disease) ([place]) 06/06/2019   CKD (chronic kidney disease) stage 3, GFR 30-59 ml/min (HCC) 04/29/2019   B12 deficiency 01/23/2019   Left arm numbness 02/28/2018   Neck pain 02/28/2018   Anemia 02/02/2017   DDD (degenerative disc disease), cervical 02/02/2017   Hypothyroid 02/02/2017   MRSA (methicillin resistant staph aureus) culture positive 02/02/2017   Nocturnal hypoxia 02/02/2017   Senile purpura ([place]) 10/03/2016   Essential hypertension, benign 09/16/2016   Bilateral carotid artery disease ([place]) 09/16/2016   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/18/2016   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   History of prostate cancer 12/21/2015   Left carpal tunnel syndrome 11/11/2015   Kidney stone on left side 07/05/2015   Myasthenia gravis ([place]) 05/26/2015   Radiculitis 01/05/2015   Long-term use of high-risk medication 10/15/2014   Persistent cough 09/10/2014   Pure hypercholesterolemia 07/25/2014   Spinal stenosis, lumbar region, with neurogenic claudication 02/24/2014   Lumbosacral stenosis with neurogenic  claudication 02/24/2014   COPD (chronic obstructive pulmonary disease) ([place]) 01/22/2014    REFERRING DIAG: balance disorder   THERAPY DIAG:  Abnormality of gait and mobility  Difficulty in walking, not elsewhere classified  Muscle weakness (generalized)  Unsteadiness on feet  Other abnormalities of gait and mobility  Other lack of coordination  Chronic bilateral low back pain, unspecified whether sciatica present  Cervical myelopathy (HCC)  Rationale for Evaluation and Treatment Rehabilitation  PERTINENT HISTORY: Francisco [name] is a 76 year old male referred to [facility] neuro for difficulty with balance. He was also just recently dx in May 2023 with Impingement syndrome of left shoulder (M75.42) Impingement syndrome of left shoulder (primary encounter diagnosis) Left shoulder pain, unspecified chronicity Nontraumatic rupture of left proximal biceps tendon Past medical hx includes Lumbar laminectomy 04/2021. C3-6 decompression, fusion on 11/26, myasthenia gravis, 2012 ACDF 5/6, 6/7; OSA, hypothyroidism, HTN, COPD, multiple back surgeries, 2021 carpal tunnel release.  PRECAUTIONS: Fall  SUBJECTIVE: Patient reports feeling weak today and having right lateral thigh to hip pain earlier today 8/10 but no pain currently. He states he is concerned about his  blood pressure as he had some low readings  PAIN:  Are you having pain? No     TODAY'S TREATMENT:   BP in sitting at rest - Left UE: 132/69 mmHg; 78 bpm BP in standing - Left UE = 136/64 mmHg; 83 bpm  Therex:   LAQ 3# Aw  2 sets x 10 ea LE (patient attempted to hold 2 sec and return with slow eccentric control- Increased fatigue and difficulty completing 2nd set)   Seated hip march  2 sets of 10x each LE with 3 # AW - VC to slow mm control. VC for breathing  with decreased height of march (L weaker than R)   Seated HS curl BTB 2 x 10 ea LE (Patient reports as very challenging)   Seated calf raises 3lb. AW BLE 2 sets of 10  reps  Rechecked BP: seated 152/76  Seated hip add with soft red ball squeeze- hold 5 sec x 10 reps.  Seated Hip flex/abd up and over 1/2 spike ball x 12 reps (Patient reported as very hard)         Pt required occasional rest breaks due fatigue, PT was quick to ask when pt appeared to be fatiguing in order to prevent excessive fatigue. Pt educated throughout session about proper posture and technique with exercises. Improved exercise technique, movement at target joints, use of target muscles after min to mod verbal, visual, tactile cues.   PATIENT EDUCATION: Education details: HEP, Economist, BERG test Person educated: Patient Education method: Explanation, Demonstration, Tactile cues, Verbal cues, and Handouts Education comprehension: verbalized understanding, returned demonstration, verbal cues required, and tactile cues required   HOME EXERCISE PROGRAM:  No Updates today  Access Code: [email or web address] URL: [email or web address] Date: 03/01/2022 Prepared by: Janna Arch  Exercises - Seated March  - 1 x daily - 7 x weekly - 2 sets - 10 reps - 5 hold - Seated Long Arc Quad  - 1 x daily - 7 x weekly - 2 sets - 10 reps - 5 hold - Seated Hip Abduction  - 1 x daily - 7 x weekly - 2 sets - 10 reps - 5 hold - Seated Hip Adduction Isometrics with Ball  - 1 x daily - 7 x weekly - 2 sets - 10 reps - 5 hold - Seated Gluteal Sets  - 1 x daily - 7 x weekly - 2 sets - 10 reps - 5 hold - Seated Heel Toe Raises  - 1 x daily - 7 x weekly - 2 sets - 10 reps - 5 hold LE strength and balance         PT Short Term Goals       PT SHORT TERM GOAL #1   Title Pt will be independent with initial HEP in order to improve strength and balance in order to decrease fall risk and improve function at home and work.    Baseline 02/23/2022- Patient reports not doing much as far as exercise in the home currently.    Time 6    Period Weeks    Status New    Target Date 04/06/22               PT Long Term Goals       PT LONG TERM GOAL #1   Title Pt will be independent with final HEP in order to improve strength and balance in order to decrease fall risk and improve function at home and work.    Baseline  02/23/2022= No formal HEP in place.    Time 12    Period Weeks    Status New    Target Date 05/18/22      PT LONG TERM GOAL #2   Title Pt will decrease 5TSTS by at least 8 seconds in order to demonstrate clinically significant improvement in LE strength.    Baseline 02/23/2022= 31.52 sec with B hands on knees    Time 12    Period Days    Status New    Target Date 05/18/22      PT LONG TERM GOAL #3   Title Pt will increase 10MWT by at least 0.23 m/s in order to demonstrate clinically significant improvement in community ambulation.    Baseline 02/23/2022= 0.39 m/s using RW    Time 12    Period Weeks    Status New    Target Date 05/18/22      PT LONG TERM GOAL #4   Title Pt will improve FOTO to target score of 50 to display perceived improvements in ability to complete ADL's.    Baseline 02/23/2022= 47    Time 12    Period Weeks    Status New    Target Date 05/18/22      PT LONG TERM GOAL #5   Title Pt will decrease TUG to below 20 seconds/decrease in order to demonstrate decreased fall risk.    Baseline 02/23/2022=28.27 sec with RW    Time 12    Period Weeks    Status New    Target Date 05/18/22              Plan -     Clinical Impression Statement Continued with current plan of care as laid out in evaluation and recent prior sessions. Pt remains motivated  but not feeling well today - treatment focused on seated LE strengthening. BP closely monitored throughout session with no issues other than fatigue.  Patient presents with more fatigue in left LE yet able to complete all therex today with increased time. Patient will benefit from skilled PT services to address his LE muscle weakness and imbalance for improved overall functional mobility,  improvedbalance, and decrease risk for future falls.     Personal Factors and Comorbidities Age;Past/Current Experience;Time since onset of injury/illness/exacerbation;Comorbidity 3+    Comorbidities COPD, History of Cancer, myasthenia gravis, chronic lumbar surgical history    Examination-Activity Limitations Lift;Squat;Bend;Stairs;Stand;Transfers;Insurance claims handler;Bathing;Hygiene/Grooming;Dressing;Toileting;Bed Mobility;Caring for Others;Continence    Examination-Participation Restrictions Yard Work;Church;Cleaning;Driving;Community Activity    Stability/Clinical Decision Making Evolving/Moderate complexity    Rehab Potential Good    Clinical Impairments Affecting Rehab Potential multipe comorbidities    PT Frequency 2x / week    PT Duration 12 weeks    PT Treatment/Interventions ADLs/Self Care Home Management;Electrical Stimulation;Moist Heat;Traction;Ultrasound;Gait training;Stair training;Functional mobility training;Therapeutic activities;Therapeutic exercise;Balance training;Neuromuscular re-education;Patient/family education;Manual techniques;Passive range of motion;Dry needling;Joint Manipulations;Cryotherapy;Vestibular;Canalith Repostioning    PT Next Visit Plan standing strengthening and balance, seated strengthening and coordination    PT Home Exercise Plan To be initiated next 1-2 visits    Consulted and Agree with Plan of Care Patient            Lewis Moccasin PT   04/14/2022, 2:14 PM

## 2022-04-14 ENCOUNTER — Ambulatory Visit: Payer: Medicare Other

## 2022-04-14 DIAGNOSIS — R278 Other lack of coordination: Secondary | ICD-10-CM

## 2022-04-14 DIAGNOSIS — R262 Difficulty in walking, not elsewhere classified: Secondary | ICD-10-CM

## 2022-04-14 DIAGNOSIS — R2681 Unsteadiness on feet: Secondary | ICD-10-CM

## 2022-04-14 DIAGNOSIS — R2689 Other abnormalities of gait and mobility: Secondary | ICD-10-CM

## 2022-04-14 DIAGNOSIS — R269 Unspecified abnormalities of gait and mobility: Secondary | ICD-10-CM | POA: Diagnosis not present

## 2022-04-14 DIAGNOSIS — R296 Repeated falls: Secondary | ICD-10-CM

## 2022-04-14 DIAGNOSIS — G8929 Other chronic pain: Secondary | ICD-10-CM

## 2022-04-14 DIAGNOSIS — M6281 Muscle weakness (generalized): Secondary | ICD-10-CM

## 2022-04-14 NOTE — Therapy (Signed)
OUTPATIENT PHYSICAL THERAPY TREATMENT NOTE   Patient Name: Francisco [name] MRN: [id number] DOB:1946-09-07, 76 y.o., male Today's Date: 04/15/2022  PCP: Adin Hector, MD REFERRING PROVIDER: Meade Maw MD    PT End of Session - 04/14/22 1155     Visit Number 9    Number of Visits 25    Date for PT Re-Evaluation 05/18/22    Authorization Time Period 02/23/2022-05/18/2022    PT Start Time 1147    PT Stop Time 1229    PT Time Calculation (min) 42 min    Equipment Utilized During Treatment Gait belt    Activity Tolerance Patient tolerated treatment well    Behavior During Therapy [facility] for tasks assessed/performed                 Past Medical History:  Diagnosis Date   Arthritis    lower left hip   Atypical angina (HCC)    Bilateral hand numbness    from back surgery   Bronchitis, chronic ([place])    Cancer ([place])    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; Merkel cell cancer, and Basal cell cancer (twice; 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   Carotid stenosis    CHF (congestive heart failure) (HCC)    CKD (chronic kidney disease)    CKD (chronic kidney disease) stage 3, GFR 30-59 ml/min (HCC)    COPD (chronic obstructive pulmonary disease) ([place])    stage 2   DDD (degenerative disc disease), cervical    Dysrhythmia    post carotid stent bradycardia; PAF 09/2020   GERD (gastroesophageal reflux disease)    Hypercholesterolemia    Hypertension    Hypothyroidism    pt takes Levothyroxine daily   Lumbosacral spinal stenosis    Myasthenia gravis, adult form ([place])    PAD (peripheral artery disease) ([place])    Shortness of breath    Lung MD- Dr Darlin Coco   Sleep apnea    do not use CPAP every night   Past Surgical History:  Procedure Laterality Date   ANTERIOR CERVICAL DECOMP/DISCECTOMY FUSION  07/18/2011   Procedure: ANTERIOR CERVICAL DECOMPRESSION/DISCECTOMY FUSION 2 LEVELS;  Surgeon: Cooper Render Pool;  Location: [place] [facility]UR[facility] OR[facility];  Service: Neurosurgery;  Laterality: N/A;  cervical five-six,  cervical six-seven anterior cervical discectomy and fusion   BACK SURGERY     in [place]     01/2020 Right, 04/2020 Left   CARDIAC CATHETERIZATION     2005 at [facility], no stents   CAROTID PTA/STENT INTERVENTION N/A 09/17/2020   Procedure: CAROTID PTA/STENT INTERVENTION;  Surgeon: Algernon Huxley, MD;  Location: [place] CV LAB;  Service: Cardiovascular;  Laterality: N/A;   CATARACT EXTRACTION W/PHACO Left 01/06/2020   Procedure: CATARACT EXTRACTION PHACO AND INTRAOCULAR LENS PLACEMENT (IOC) ISTENT INJ LEFT 3.81  00:33.3;  Surgeon: Eulogio Bear, MD;  Location: [place];  Service: Ophthalmology;  Laterality: Left;   CATARACT EXTRACTION W/PHACO Right 02/03/2020   Procedure: CATARACT EXTRACTION PHACO AND INTRAOCULAR LENS PLACEMENT ([place]) RIGHT ISTENT INJ;  Surgeon: Eulogio Bear, MD;  Location: [place];  Service: Ophthalmology;  Laterality: Right;  4.29 0:35.6   COLONOSCOPY     HERNIA REPAIR Left    inguinal hernia repair in 1985   LUMBAR LAMINECTOMY/DECOMPRESSION MICRODISCECTOMY Left 02/24/2014   Procedure: LUMBAR LAMINECTOMY/DECOMPRESSION MICRODISCECTOMY LUMBAR THREE-FOUR, FOUR-FIVE, LEFT FIVE-SACRAL ONE ;  Surgeon: Charlie Pitter, MD;  Location: [place] [facility]UR[facility] ORS;  Service: Neurosurgery;  Laterality: Left;  LUMBAR  LAMINECTOMY/DECOMPRESSION MICRODISCECTOMY LUMBAR THREE-FOUR, FOUR-FIVE, LEFT FIVE-SACRAL ONE    LUMBAR LAMINECTOMY/DECOMPRESSION MICRODISCECTOMY N/A 05/03/2021   Procedure: Laminectomy and Foraminotomy - L2-L3;  Surgeon: Earnie Larsson, MD;  Location: [place];  Service: Neurosurgery;  Laterality: N/A;  3C   POSTERIOR CERVICAL FUSION/FORAMINOTOMY N/A 08/07/2020   Procedure: C3-6 POSTERIOR FUSION WITH DECOMPRESSION;  Surgeon: Meade Maw, MD;  Location: [facility] ORS;  Service: Neurosurgery;  Laterality: N/A;   PROSTATECTOMY  8/14   [facility] Dr Mare Ferrari    Patient Active Problem List   Diagnosis Date Noted   SVT  (supraventricular tachycardia) ([place]) 12/30/2021   Hypothyroidism 12/30/2021   Parkinson's disease ([place]) 12/30/2021   Elevated troponin 12/30/2021   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   Acquired thrombophilia ([place]) 01/13/2021   History of decompression of median nerve 01/01/2021   Paroxysmal atrial fibrillation ([place]) 10/06/2020   Carotid stenosis, right 09/17/2020   S/P cervical spinal fusion    Leukocytosis    Essential hypertension    Anemia of chronic disease    Postoperative pain    Neuropathic pain    Cervical myelopathy ([place]) 08/07/2020   Preop cardiovascular exam 07/17/2020   SOB (shortness of breath) on exertion 07/17/2020   Leg weakness, bilateral 07/13/2020   Aortic atherosclerosis ([place]) 07/09/2020   Body mass index (BMI) 34.0-34.9, adult 12/25/2019   Myalgia 10/30/2019   Lumbar post-laminectomy syndrome 10/24/2019   PAD (peripheral artery disease) ([place]) 06/06/2019   CKD (chronic kidney disease) stage 3, GFR 30-59 ml/min (HCC) 04/29/2019   B12 deficiency 01/23/2019   Left arm numbness 02/28/2018   Neck pain 02/28/2018   Anemia 02/02/2017   DDD (degenerative disc disease), cervical 02/02/2017   Hypothyroid 02/02/2017   MRSA (methicillin resistant staph aureus) culture positive 02/02/2017   Nocturnal hypoxia 02/02/2017   Senile purpura ([place]) 10/03/2016   Essential hypertension, benign 09/16/2016   Bilateral carotid artery disease ([place]) 09/16/2016   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/18/2016   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   History of prostate cancer 12/21/2015   Left carpal tunnel syndrome 11/11/2015   Kidney stone on left side 07/05/2015   Myasthenia gravis ([place]) 05/26/2015   Radiculitis 01/05/2015   Long-term use of high-risk medication 10/15/2014   Persistent cough 09/10/2014   Pure hypercholesterolemia 07/25/2014   Spinal stenosis, lumbar region, with neurogenic claudication 02/24/2014   Lumbosacral stenosis with neurogenic  claudication 02/24/2014   COPD (chronic obstructive pulmonary disease) ([place]) 01/22/2014    REFERRING DIAG: balance disorder   THERAPY DIAG:  Abnormality of gait and mobility  Difficulty in walking, not elsewhere classified  Muscle weakness (generalized)  Other abnormalities of gait and mobility  Other lack of coordination  Repeated falls  Unsteadiness on feet  Chronic bilateral low back pain, unspecified whether sciatica present  Rationale for Evaluation and Treatment Rehabilitation  PERTINENT HISTORY: Gerson Fauth is a 76 year old male referred to OPPT neuro for difficulty with balance. He was also just recently dx in May 2023 with Impingement syndrome of left shoulder (M75.42) Impingement syndrome of left shoulder (primary encounter diagnosis) Left shoulder pain, unspecified chronicity Nontraumatic rupture of left proximal biceps tendon Past medical hx includes Lumbar laminectomy 04/2021. C3-6 decompression, fusion on 11/26, myasthenia gravis, 2012 ACDF 5/6, 6/7; OSA, hypothyroidism, HTN, COPD, multiple back surgeries, 2021 carpal tunnel release.  PRECAUTIONS: Fall  SUBJECTIVE: Patient reports feeling a little better today- Not as weak. Reports he held his torsemide as instructed by MD due to some recent low BP readings.  PAIN:  Are you having pain? No     TODAY'S TREATMENT:   BP in sitting at rest - Left UE: 141/75 mmHg; 72 bpm   Therex:   Nustep- HIIT- LE only L1-1 min L5 -30sec L1-30 sec L5- 30 sec L1-1 min L5-30 sec L1-min L5-30 sec L1-30 min    Scap retraction with BTB  1 set of 12; 1 x 15 reps   Step tap - alt LE x 1 min with min UE support   Ambulation x 70 feet without AD and then another 70 feet with SPC   Posterior shoulder roll x 15 reps   Shoulder ext- BTB  BUE x 15 reps   Step tap - alt LE x 1 min with min support   Ambulation x 140 feet  with SPC- Short reciprocal step with increased reported/observed fatigue.     Pt required  occasional rest breaks due fatigue, PT was quick to ask when pt appeared to be fatiguing in order to prevent excessive fatigue. Pt educated throughout session about proper posture and technique with exercises. Improved exercise technique, movement at target joints, use of target muscles after min to mod verbal, visual, tactile cues.   PATIENT EDUCATION: Education details: HEP, Economist, BERG test Person educated: Patient Education method: Explanation, Demonstration, Tactile cues, Verbal cues, and Handouts Education comprehension: verbalized understanding, returned demonstration, verbal cues required, and tactile cues required   HOME EXERCISE PROGRAM:  No Updates today  Access Code: [email or web address] URL: [email or web address] Date: 03/01/2022 Prepared by: Janna Arch  Exercises - Seated March  - 1 x daily - 7 x weekly - 2 sets - 10 reps - 5 hold - Seated Long Arc Quad  - 1 x daily - 7 x weekly - 2 sets - 10 reps - 5 hold - Seated Hip Abduction  - 1 x daily - 7 x weekly - 2 sets - 10 reps - 5 hold - Seated Hip Adduction Isometrics with Ball  - 1 x daily - 7 x weekly - 2 sets - 10 reps - 5 hold - Seated Gluteal Sets  - 1 x daily - 7 x weekly - 2 sets - 10 reps - 5 hold - Seated Heel Toe Raises  - 1 x daily - 7 x weekly - 2 sets - 10 reps - 5 hold LE strength and balance         PT Short Term Goals       PT SHORT TERM GOAL #1   Title Pt will be independent with initial HEP in order to improve strength and balance in order to decrease fall risk and improve function at home and work.    Baseline 02/23/2022- Patient reports not doing much as far as exercise in the home currently.    Time 6    Period Weeks    Status New    Target Date 04/06/22              PT Long Term Goals       PT LONG TERM GOAL #1   Title Pt will be independent with final HEP in order to improve strength and balance in order to decrease fall risk and improve function at home and work.     Baseline 02/23/2022= No formal HEP in place.    Time 12    Period Weeks    Status New    Target Date 05/18/22      PT LONG TERM GOAL #2   Title  Pt will decrease 5TSTS by at least 8 seconds in order to demonstrate clinically significant improvement in LE strength.    Baseline 02/23/2022= 31.52 sec with B hands on knees    Time 12    Period Days    Status New    Target Date 05/18/22      PT LONG TERM GOAL #3   Title Pt will increase 10MWT by at least 0.23 m/s in order to demonstrate clinically significant improvement in community ambulation.    Baseline 02/23/2022= 0.39 m/s using RW    Time 12    Period Weeks    Status New    Target Date 05/18/22      PT LONG TERM GOAL #4   Title Pt will improve FOTO to target score of 50 to display perceived improvements in ability to complete ADL's.    Baseline 02/23/2022= 47    Time 12    Period Weeks    Status New    Target Date 05/18/22      PT LONG TERM GOAL #5   Title Pt will decrease TUG to below 20 seconds/decrease in order to demonstrate decreased fall risk.    Baseline 02/23/2022=28.27 sec with RW    Time 12    Period Weeks    Status New    Target Date 05/18/22              Plan -     Clinical Impression Statement Continued with current plan of care as laid out in evaluation and recent prior sessions. Patient was able to participate more in standing activities with fatigue as limiting factor. Patient required brief rest breaks but able to complete all therex today. Patient will benefit from skilled PT services to address his LE muscle weakness and imbalance for improved overall functional mobility, improvedbalance, and decrease risk for future falls.     Personal Factors and Comorbidities Age;Past/Current Experience;Time since onset of injury/illness/exacerbation;Comorbidity 3+    Comorbidities COPD, History of Cancer, myasthenia gravis, chronic lumbar surgical history    Examination-Activity Limitations  Lift;Squat;Bend;Stairs;Stand;Transfers;Insurance claims handler;Bathing;Hygiene/Grooming;Dressing;Toileting;Bed Mobility;Caring for Others;Continence    Examination-Participation Restrictions Yard Work;Church;Cleaning;Driving;Community Activity    Stability/Clinical Decision Making Evolving/Moderate complexity    Rehab Potential Good    Clinical Impairments Affecting Rehab Potential multipe comorbidities    PT Frequency 2x / week    PT Duration 12 weeks    PT Treatment/Interventions ADLs/Self Care Home Management;Electrical Stimulation;Moist Heat;Traction;Ultrasound;Gait training;Stair training;Functional mobility training;Therapeutic activities;Therapeutic exercise;Balance training;Neuromuscular re-education;Patient/family education;Manual techniques;Passive range of motion;Dry needling;Joint Manipulations;Cryotherapy;Vestibular;Canalith Repostioning    PT Next Visit Plan standing strengthening and balance, seated strengthening and coordination    PT Home Exercise Plan To be initiated next 1-2 visits    Consulted and Agree with Plan of Care Patient            Lewis Moccasin PT   04/15/2022, 12:32 PM

## 2022-04-18 ENCOUNTER — Ambulatory Visit: Payer: Medicare Other

## 2022-04-20 ENCOUNTER — Ambulatory Visit: Payer: Medicare Other

## 2022-04-20 DIAGNOSIS — M6281 Muscle weakness (generalized): Secondary | ICD-10-CM

## 2022-04-20 DIAGNOSIS — R278 Other lack of coordination: Secondary | ICD-10-CM

## 2022-04-20 DIAGNOSIS — R269 Unspecified abnormalities of gait and mobility: Secondary | ICD-10-CM | POA: Diagnosis not present

## 2022-04-20 DIAGNOSIS — R262 Difficulty in walking, not elsewhere classified: Secondary | ICD-10-CM

## 2022-04-20 DIAGNOSIS — R2689 Other abnormalities of gait and mobility: Secondary | ICD-10-CM

## 2022-04-20 DIAGNOSIS — R296 Repeated falls: Secondary | ICD-10-CM

## 2022-04-20 NOTE — Therapy (Signed)
OUTPATIENT PHYSICAL THERAPY TREATMENT NOTE/Physical Therapy Progress Note   Dates of reporting period  02/23/2022   to   04/20/2022   Patient Name: Francisco [name] MRN: [id number] DOB:Jan 30, 1946, 76 y.o., male Today's Date: 04/22/2022  PCP: Adin Hector, MD REFERRING PROVIDER: Meade Maw MD    PT End of Session - 04/22/22 1229     Visit Number 10    Number of Visits 25    Date for PT Re-Evaluation 05/18/22    Authorization Time Period 02/23/2022-05/18/2022    PT Start Time 1435    PT Stop Time 1514    PT Time Calculation (min) 39 min    Equipment Utilized During Treatment Gait belt    Activity Tolerance Patient tolerated treatment well    Behavior During Therapy [facility] for tasks assessed/performed                 Past Medical History:  Diagnosis Date   Arthritis    lower left hip   Atypical angina (HCC)    Bilateral hand numbness    from back surgery   Bronchitis, chronic ([place])    Cancer ([place])    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; Merkel cell cancer, and Basal cell cancer (twice; 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   Carotid stenosis    CHF (congestive heart failure) (HCC)    CKD (chronic kidney disease)    CKD (chronic kidney disease) stage 3, GFR 30-59 ml/min (HCC)    COPD (chronic obstructive pulmonary disease) ([place])    stage 2   DDD (degenerative disc disease), cervical    Dysrhythmia    post carotid stent bradycardia; PAF 09/2020   GERD (gastroesophageal reflux disease)    Hypercholesterolemia    Hypertension    Hypothyroidism    pt takes Levothyroxine daily   Lumbosacral spinal stenosis    Myasthenia gravis, adult form ([place])    PAD (peripheral artery disease) ([place])    Shortness of breath    Lung MD- Dr Darlin Coco   Sleep apnea    do not use CPAP every night   Past Surgical History:  Procedure Laterality Date   ANTERIOR CERVICAL DECOMP/DISCECTOMY FUSION  07/18/2011   Procedure: ANTERIOR CERVICAL DECOMPRESSION/DISCECTOMY FUSION 2 LEVELS;  Surgeon: Cooper Render  Pool;  Location: [place] [facility];  Service: Neurosurgery;  Laterality: N/A;  cervical five-six, cervical six-seven anterior cervical discectomy and fusion   BACK SURGERY     in [place]     01/2020 Right, 04/2020 Left   CARDIAC CATHETERIZATION     2005 at [facility], no stents   CAROTID PTA/STENT INTERVENTION N/A 09/17/2020   Procedure: CAROTID PTA/STENT INTERVENTION;  Surgeon: Algernon Huxley, MD;  Location: [place] [facility];  Service: Cardiovascular;  Laterality: N/A;   CATARACT EXTRACTION W/PHACO Left 01/06/2020   Procedure: CATARACT EXTRACTION PHACO AND INTRAOCULAR LENS PLACEMENT (IOC) ISTENT INJ LEFT 3.81  00:33.3;  Surgeon: Eulogio Bear, MD;  Location: [place];  Service: Ophthalmology;  Laterality: Left;   CATARACT EXTRACTION W/PHACO Right 02/03/2020   Procedure: CATARACT EXTRACTION PHACO AND INTRAOCULAR LENS PLACEMENT ([place]) RIGHT ISTENT INJ;  Surgeon: Eulogio Bear, MD;  Location: [facility];  Service: Ophthalmology;  Laterality: Right;  4.29 0:35.6   COLONOSCOPY     HERNIA REPAIR Left    inguinal hernia repair in 1985   LUMBAR LAMINECTOMY/DECOMPRESSION MICRODISCECTOMY Left 02/24/2014   Procedure: LUMBAR LAMINECTOMY/DECOMPRESSION MICRODISCECTOMY LUMBAR THREE-FOUR, FOUR-FIVE, LEFT FIVE-SACRAL ONE ;  Surgeon:  Charlie Pitter, MD;  Location: [facility];  Service: Neurosurgery;  Laterality: Left;  LUMBAR LAMINECTOMY/DECOMPRESSION MICRODISCECTOMY LUMBAR THREE-FOUR, FOUR-FIVE, LEFT FIVE-SACRAL ONE    LUMBAR LAMINECTOMY/DECOMPRESSION MICRODISCECTOMY N/A 05/03/2021   Procedure: Laminectomy and Foraminotomy - L2-L3;  Surgeon: Earnie Larsson, MD;  Location: [place];  Service: Neurosurgery;  Laterality: N/A;  3C   POSTERIOR CERVICAL FUSION/FORAMINOTOMY N/A 08/07/2020   Procedure: C3-6 POSTERIOR FUSION WITH DECOMPRESSION;  Surgeon: Meade Maw, MD;  Location: [facility];  Service: Neurosurgery;  Laterality: N/A;   PROSTATECTOMY  8/14   [facility]  Dr Mare Ferrari    Patient Active Problem List   Diagnosis Date Noted   SVT (supraventricular tachycardia) ([place]) 12/30/2021   Hypothyroidism 12/30/2021   Parkinson's disease ([place]) 12/30/2021   Elevated troponin 12/30/2021   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   Acquired thrombophilia ([place]) 01/13/2021   History of decompression of median nerve 01/01/2021   Paroxysmal atrial fibrillation ([place]) 10/06/2020   Carotid stenosis, right 09/17/2020   S/P cervical spinal fusion    Leukocytosis    Essential hypertension    Anemia of chronic disease    Postoperative pain    Neuropathic pain    Cervical myelopathy ([place]) 08/07/2020   Preop cardiovascular exam 07/17/2020   SOB (shortness of breath) on exertion 07/17/2020   Leg weakness, bilateral 07/13/2020   Aortic atherosclerosis ([place]) 07/09/2020   Body mass index (BMI) 34.0-34.9, adult 12/25/2019   Myalgia 10/30/2019   Lumbar post-laminectomy syndrome 10/24/2019   PAD (peripheral artery disease) ([place]) 06/06/2019   CKD (chronic kidney disease) stage 3, GFR 30-59 ml/min (HCC) 04/29/2019   B12 deficiency 01/23/2019   Left arm numbness 02/28/2018   Neck pain 02/28/2018   Anemia 02/02/2017   DDD (degenerative disc disease), cervical 02/02/2017   Hypothyroid 02/02/2017   MRSA (methicillin resistant staph aureus) culture positive 02/02/2017   Nocturnal hypoxia 02/02/2017   Senile purpura ([place]) 10/03/2016   Essential hypertension, benign 09/16/2016   Bilateral carotid artery disease ([place]) 09/16/2016   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/18/2016   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   History of prostate cancer 12/21/2015   Left carpal tunnel syndrome 11/11/2015   Kidney stone on left side 07/05/2015   Myasthenia gravis ([place]) 05/26/2015   Radiculitis 01/05/2015   Long-term use of high-risk medication 10/15/2014   Persistent cough 09/10/2014   Pure hypercholesterolemia 07/25/2014   Spinal stenosis, lumbar region,  with neurogenic claudication 02/24/2014   Lumbosacral stenosis with neurogenic claudication 02/24/2014   COPD (chronic obstructive pulmonary disease) ([place]) 01/22/2014    REFERRING DIAG: balance disorder   THERAPY DIAG:  Abnormality of gait and mobility  Difficulty in walking, not elsewhere classified  Muscle weakness (generalized)  Other abnormalities of gait and mobility  Other lack of coordination  Repeated falls  Rationale for Evaluation and Treatment Rehabilitation  PERTINENT HISTORY: Maddon Horton is a 76 year old male referred to [facility] neuro for difficulty with balance. He was also just recently dx in May 2023 with Impingement syndrome of left shoulder (M75.42) Impingement syndrome of left shoulder (primary encounter diagnosis) Left shoulder pain, unspecified chronicity Nontraumatic rupture of left proximal biceps tendon Past medical hx includes Lumbar laminectomy 04/2021. C3-6 decompression, fusion on 11/26, myasthenia gravis, 2012 ACDF 5/6, 6/7; OSA, hypothyroidism, HTN, COPD, multiple back surgeries, 2021 carpal tunnel release.  PRECAUTIONS: Fall  SUBJECTIVE: Patient reports ongoing weakness but states "I will do my best. I so want to get stronger."  PAIN:  Are you having pain? No  TODAY'S TREATMENT:   Reassessed goals for progress note visit #10- See goals for all details.   Therex:   Nustep- HIIT- LE/UE seat level 9 and arm level 10 L1-1 min L5 -30sec L1-30 sec L6- 30 sec L1-1 min L6-30 sec L1-min L6-30 sec L1-30 min L1 x 1 min Total distance= 0.25 mi Total time= 7 min.    Reviewed seated HEP in clinic:    Hip march x 10 reps Alt LE   Knee ext x 10 reps alt LE   *patient requested to stop due to fatigue     Pt required occasional rest breaks due fatigue, PT was quick to ask when pt appeared to be fatiguing in order to prevent excessive fatigue. Pt educated throughout session about proper posture and technique with exercises. Improved  exercise technique, movement at target joints, use of target muscles after min to mod verbal, visual, tactile cues.   PATIENT EDUCATION: Education details: HEP, Economist, BERG test Person educated: Patient Education method: Explanation, Demonstration, Tactile cues, Verbal cues, and Handouts Education comprehension: verbalized understanding, returned demonstration, verbal cues required, and tactile cues required   HOME EXERCISE PROGRAM:  No Updates today  Access Code: [email or web address] URL: [email or web address] Date: 03/01/2022 Prepared by: Janna Arch  Exercises - Seated March  - 1 x daily - 7 x weekly - 2 sets - 10 reps - 5 hold - Seated Long Arc Quad  - 1 x daily - 7 x weekly - 2 sets - 10 reps - 5 hold - Seated Hip Abduction  - 1 x daily - 7 x weekly - 2 sets - 10 reps - 5 hold - Seated Hip Adduction Isometrics with Ball  - 1 x daily - 7 x weekly - 2 sets - 10 reps - 5 hold - Seated Gluteal Sets  - 1 x daily - 7 x weekly - 2 sets - 10 reps - 5 hold - Seated Heel Toe Raises  - 1 x daily - 7 x weekly - 2 sets - 10 reps - 5 hold LE strength and balance         PT Short Term Goals       PT SHORT TERM GOAL #1   Title Pt will be independent with initial HEP in order to improve strength and balance in order to decrease fall risk and improve function at home and work.    Baseline 02/23/2022- Patient reports not doing much as far as exercise in the home currently. 04/20/2022- Patient reports compliant with HEP and no questions at this time.    Time 6    Period Weeks    Status Goal met   Target Date 04/06/22              PT Long Term Goals       PT LONG TERM GOAL #1   Title Pt will be independent with final HEP in order to improve strength and balance in order to decrease fall risk and improve function at home and work. 04/20/2022- Patient reports compliant with HEP and no questions at this time.    Baseline 02/23/2022= No formal HEP in place.    Time 12     Period Weeks    Status Ongoing   Target Date 05/18/22      PT LONG TERM GOAL #2   Title Pt will decrease 5TSTS by at least 8 seconds in order to demonstrate clinically significant improvement in LE strength.    Baseline 02/23/2022= 31.52 sec  with B hands on knees; 04/20/2022= 16.08 sec with B hands on knees   Time 12    Period Days    Status Goal Met   Target Date 05/18/22      PT LONG TERM GOAL #3   Title Pt will increase 10MWT by at least 0.23 m/s in order to demonstrate clinically significant improvement in community ambulation.    Baseline 02/23/2022= 0.39 m/s using RW; 0.45 m/s    Time 12    Period Weeks    Status  Improving/Ongoing   Target Date 05/18/22      PT LONG TERM GOAL #4   Title Pt will improve FOTO to target score of 50 to display perceived improvements in ability to complete ADL's.    Baseline 02/23/2022= 47    Time 12    Period Weeks    Status New    Target Date 05/18/22      PT LONG TERM GOAL #5   Title Pt will decrease TUG to below 20 seconds/decrease in order to demonstrate decreased fall risk.    Baseline 02/23/2022=28.27 sec with RW; 04/20/2022= 20.22 with SBQC   Time 12    Period Weeks    Status Ongoing   Target Date 05/18/22              Plan -     Clinical Impression Statement Continued with current plan of care as laid out in evaluation and recent prior sessions. Patient presents today with some progress as seen by retesting of functional outcome measures. He did demo improvement with functional LE strength and mobility with decreased risk of falling. He is still well below normal limits and will benefit from skilled PT services to address his LE muscle weakness and imbalance for improved overall functional mobility, improvedbalance, and decrease risk for future falls. Patient's condition has the potential to improve in response to therapy. Maximum improvement is yet to be obtained. The anticipated improvement is attainable and reasonable in a generally  predictable time.     Personal Factors and Comorbidities Age;Past/Current Experience;Time since onset of injury/illness/exacerbation;Comorbidity 3+    Comorbidities COPD, History of Cancer, myasthenia gravis, chronic lumbar surgical history    Examination-Activity Limitations Lift;Squat;Bend;Stairs;Stand;Transfers;Insurance claims handler;Bathing;Hygiene/Grooming;Dressing;Toileting;Bed Mobility;Caring for Others;Continence    Examination-Participation Restrictions Yard Work;Church;Cleaning;Driving;Community Activity    Stability/Clinical Decision Making Evolving/Moderate complexity    Rehab Potential Good    Clinical Impairments Affecting Rehab Potential multipe comorbidities    PT Frequency 2x / week    PT Duration 12 weeks    PT Treatment/Interventions ADLs/Self Care Home Management;Electrical Stimulation;Moist Heat;Traction;Ultrasound;Gait training;Stair training;Functional mobility training;Therapeutic activities;Therapeutic exercise;Balance training;Neuromuscular re-education;Patient/family education;Manual techniques;Passive range of motion;Dry needling;Joint Manipulations;Cryotherapy;Vestibular;Canalith Repostioning    PT Next Visit Plan standing strengthening and balance, seated strengthening and coordination    PT Home Exercise Plan Progress LE strengthen and functional endurance   Consulted and Agree with Plan of Care Patient            Lewis Moccasin PT   04/22/2022, 12:34 PM

## 2022-04-25 ENCOUNTER — Ambulatory Visit: Payer: Medicare Other

## 2022-04-25 DIAGNOSIS — R278 Other lack of coordination: Secondary | ICD-10-CM

## 2022-04-25 DIAGNOSIS — R269 Unspecified abnormalities of gait and mobility: Secondary | ICD-10-CM | POA: Diagnosis not present

## 2022-04-25 DIAGNOSIS — M6281 Muscle weakness (generalized): Secondary | ICD-10-CM

## 2022-04-25 DIAGNOSIS — R2689 Other abnormalities of gait and mobility: Secondary | ICD-10-CM

## 2022-04-25 DIAGNOSIS — G959 Disease of spinal cord, unspecified: Secondary | ICD-10-CM

## 2022-04-25 DIAGNOSIS — R2681 Unsteadiness on feet: Secondary | ICD-10-CM

## 2022-04-25 DIAGNOSIS — R296 Repeated falls: Secondary | ICD-10-CM

## 2022-04-25 DIAGNOSIS — G8929 Other chronic pain: Secondary | ICD-10-CM

## 2022-04-25 DIAGNOSIS — R262 Difficulty in walking, not elsewhere classified: Secondary | ICD-10-CM

## 2022-04-25 NOTE — Therapy (Signed)
OUTPATIENT PHYSICAL THERAPY TREATMENT NOTE   Patient Name: Francisco [name] MRN: [id number] DOB:04/12/1946, 76 y.o., male Today's Date: 04/25/2022  PCP: Klein, Bert J III, MD REFERRING PROVIDER: Yarbrough, Chester MD    PT End of Session - 04/25/22 1028     Visit Number 11    Number of Visits 25    Date for PT Re-Evaluation 05/18/22    Authorization Time Period 02/23/2022-05/18/2022    PT Start Time 1018    PT Stop Time 1058    PT Time Calculation (min) 40 min    Equipment Utilized During Treatment Gait belt    Activity Tolerance Patient tolerated treatment well    Behavior During Therapy WFL for tasks assessed/performed                  Past Medical History:  Diagnosis Date   Arthritis    lower left hip   Atypical angina (HCC)    Bilateral hand numbness    from back surgery   Bronchitis, chronic (HCC)    Cancer (HCC)    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; Merkel cell cancer, and Basal cell cancer (twice; 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   Carotid stenosis    CHF (congestive heart failure) (HCC)    CKD (chronic kidney disease)    CKD (chronic kidney disease) stage 3, GFR 30-59 ml/min (HCC)    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   DDD (degenerative disc disease), cervical    Dysrhythmia    post carotid stent bradycardia; PAF 09/2020   GERD (gastroesophageal reflux disease)    Hypercholesterolemia    Hypertension    Hypothyroidism    pt takes Levothyroxine daily   Lumbosacral spinal stenosis    Myasthenia gravis, adult form (HCC)    PAD (peripheral artery disease) (HCC)    Shortness of breath    Lung MD- Dr Herbin Fleming   Sleep apnea    do not use CPAP every night   Past Surgical History:  Procedure Laterality Date   ANTERIOR CERVICAL DECOMP/DISCECTOMY FUSION  07/18/2011   Procedure: ANTERIOR CERVICAL DECOMPRESSION/DISCECTOMY FUSION 2 LEVELS;  Surgeon: Henry A Pool;  Location: [facility];  Service: Neurosurgery;  Laterality: N/A;  cervical  five-six, cervical six-seven anterior cervical discectomy and fusion   BACK SURGERY     in 1985 [facility]   BILATERAL CARPAL TUNNEL RELEASE     01/2020 Right, 04/2020 Left   CARDIAC CATHETERIZATION     2005 at [facility], no stents   CAROTID PTA/STENT INTERVENTION N/A 09/17/2020   Procedure: CAROTID PTA/STENT INTERVENTION;  Surgeon: Dew, Jason S, MD;  Location: [facility];  Service: Cardiovascular;  Laterality: N/A;   CATARACT EXTRACTION W/PHACO Left 01/06/2020   Procedure: CATARACT EXTRACTION PHACO AND INTRAOCULAR LENS PLACEMENT (IOC) ISTENT INJ LEFT 3.81  00:33.3;  Surgeon: King, Bradley Mark, MD;  Location: [facility];  Service: Ophthalmology;  Laterality: Left;   CATARACT EXTRACTION W/PHACO Right 02/03/2020   Procedure: CATARACT EXTRACTION PHACO AND INTRAOCULAR LENS PLACEMENT (IOC) RIGHT ISTENT INJ;  Surgeon: King, Bradley Mark, MD;  Location: [facility];  Service: Ophthalmology;  Laterality: Right;  4.29 0:35.6   COLONOSCOPY     HERNIA REPAIR Left    inguinal hernia repair in 1985   LUMBAR LAMINECTOMY/DECOMPRESSION MICRODISCECTOMY Left 02/24/2014   Procedure: LUMBAR LAMINECTOMY/DECOMPRESSION MICRODISCECTOMY LUMBAR THREE-FOUR, FOUR-FIVE, LEFT FIVE-SACRAL ONE ;  Surgeon: Henry A Pool, MD;  Location: [facility];  Service: Neurosurgery;  Laterality: Left;    LUMBAR LAMINECTOMY/DECOMPRESSION MICRODISCECTOMY LUMBAR THREE-FOUR, FOUR-FIVE, LEFT FIVE-SACRAL ONE    LUMBAR LAMINECTOMY/DECOMPRESSION MICRODISCECTOMY N/A 05/03/2021   Procedure: Laminectomy and Foraminotomy - L2-L3;  Surgeon: Pool, Henry, MD;  Location: [facility];  Service: Neurosurgery;  Laterality: N/A;  3C   POSTERIOR CERVICAL FUSION/FORAMINOTOMY N/A 08/07/2020   Procedure: C3-6 POSTERIOR FUSION WITH DECOMPRESSION;  Surgeon: Yarbrough, Chester, MD;  Location: [facility];  Service: Neurosurgery;  Laterality: N/A;   PROSTATECTOMY  8/14   [facility] Dr Apostolos Wolfe    Patient Active Problem List   Diagnosis Date Noted   SVT  (supraventricular tachycardia) (HCC) 12/30/2021   Hypothyroidism 12/30/2021   Parkinson's disease (HCC) 12/30/2021   Elevated troponin 12/30/2021   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   Acquired thrombophilia (HCC) 01/13/2021   History of decompression of median nerve 01/01/2021   Paroxysmal atrial fibrillation (HCC) 10/06/2020   Carotid stenosis, right 09/17/2020   S/P cervical spinal fusion    Leukocytosis    Essential hypertension    Anemia of chronic disease    Postoperative pain    Neuropathic pain    Cervical myelopathy (HCC) 08/07/2020   Preop cardiovascular exam 07/17/2020   SOB (shortness of breath) on exertion 07/17/2020   Leg weakness, bilateral 07/13/2020   Aortic atherosclerosis (HCC) 07/09/2020   Body mass index (BMI) 34.0-34.9, adult 12/25/2019   Myalgia 10/30/2019   Lumbar post-laminectomy syndrome 10/24/2019   PAD (peripheral artery disease) (HCC) 06/06/2019   CKD (chronic kidney disease) stage 3, GFR 30-59 ml/min (HCC) 04/29/2019   B12 deficiency 01/23/2019   Left arm numbness 02/28/2018   Neck pain 02/28/2018   Anemia 02/02/2017   DDD (degenerative disc disease), cervical 02/02/2017   Hypothyroid 02/02/2017   MRSA (methicillin resistant staph aureus) culture positive 02/02/2017   Nocturnal hypoxia 02/02/2017   Senile purpura (HCC) 10/03/2016   Essential hypertension, benign 09/16/2016   Bilateral carotid artery disease (HCC) 09/16/2016   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/18/2016   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   History of prostate cancer 12/21/2015   Left carpal tunnel syndrome 11/11/2015   Kidney stone on left side 07/05/2015   Myasthenia gravis (HCC) 05/26/2015   Radiculitis 01/05/2015   Long-term use of high-risk medication 10/15/2014   Persistent cough 09/10/2014   Pure hypercholesterolemia 07/25/2014   Spinal stenosis, lumbar region, with neurogenic claudication 02/24/2014   Lumbosacral stenosis with neurogenic  claudication 02/24/2014   COPD (chronic obstructive pulmonary disease) (HCC) 01/22/2014    REFERRING DIAG: balance disorder   THERAPY DIAG:  Abnormality of gait and mobility  Difficulty in walking, not elsewhere classified  Other abnormalities of gait and mobility  Muscle weakness (generalized)  Other lack of coordination  Repeated falls  Unsteadiness on feet  Chronic bilateral low back pain, unspecified whether sciatica present  Cervical myelopathy (HCC)  Rationale for Evaluation and Treatment Rehabilitation  PERTINENT HISTORY: Donevin Cornelio is a 76 year old male referred to [facility] neuro for difficulty with balance. He was also just recently dx in May 2023 with Impingement syndrome of left shoulder (M75.42) Impingement syndrome of left shoulder (primary encounter diagnosis) Left shoulder pain, unspecified chronicity Nontraumatic rupture of left proximal biceps tendon Past medical hx includes Lumbar laminectomy 04/2021. C3-6 decompression, fusion on 11/26, myasthenia gravis, 2012 ACDF 5/6, 6/7; OSA, hypothyroidism, HTN, COPD, multiple back surgeries, 2021 carpal tunnel release.  PRECAUTIONS: Fall  SUBJECTIVE: Patient reports having a rough weekend - could barely walk yesterday and slept for over 5 hours to try to rest. He reports   late last week he went on a trip over 3 hours one way which may be contributing to his weakness.   PAIN:  Are you having pain? No     TODAY'S TREATMENT:   Therex:      -Hip march 4lb. AW x 10 reps Alt LE -Forward/retro gait in // bars with BUE support and 4lb AW - down and back 3 times focusing on large steps.   -Knee ext x 15 reps alt LE   -Step taps with 4lb AW with BUE support    -Heel slides with use of glider 4lb AW x 15 reps each LE   -Seated ham curl with BTB x 12 reps each LE   -Standing tandem x mult attempts ranging from 3 sec to 8 sec without BUE support in // bars -Slow step (green therapad) tapping without BUE support x approx  10-12 reps each focusing on SLS and patient with difficulty performing requiring intermittent reaching with UE for support.      Pt required occasional rest breaks due fatigue, PT was quick to ask when pt appeared to be fatiguing in order to prevent excessive fatigue. Pt educated throughout session about proper posture and technique with exercises. Improved exercise technique, movement at target joints, use of target muscles after min to mod verbal, visual, tactile cues.   PATIENT EDUCATION: Education details: HEP, Economist, BERG test Person educated: Patient Education method: Explanation, Demonstration, Tactile cues, Verbal cues, and Handouts Education comprehension: verbalized understanding, returned demonstration, verbal cues required, and tactile cues required   HOME EXERCISE PROGRAM:  No Updates today  Access Code: [email or web address] URL: [email or web address] Date: 03/01/2022 Prepared by: Janna Arch  Exercises - Seated March  - 1 x daily - 7 x weekly - 2 sets - 10 reps - 5 hold - Seated Long Arc Quad  - 1 x daily - 7 x weekly - 2 sets - 10 reps - 5 hold - Seated Hip Abduction  - 1 x daily - 7 x weekly - 2 sets - 10 reps - 5 hold - Seated Hip Adduction Isometrics with Ball  - 1 x daily - 7 x weekly - 2 sets - 10 reps - 5 hold - Seated Gluteal Sets  - 1 x daily - 7 x weekly - 2 sets - 10 reps - 5 hold - Seated Heel Toe Raises  - 1 x daily - 7 x weekly - 2 sets - 10 reps - 5 hold LE strength and balance         PT Short Term Goals       PT SHORT TERM GOAL #1   Title Pt will be independent with initial HEP in order to improve strength and balance in order to decrease fall risk and improve function at home and work.    Baseline 02/23/2022- Patient reports not doing much as far as exercise in the home currently. 04/20/2022- Patient reports compliant with HEP and no questions at this time.    Time 6    Period Weeks    Status Goal met   Target Date 04/06/22               PT Long Term Goals       PT LONG TERM GOAL #1   Title Pt will be independent with final HEP in order to improve strength and balance in order to decrease fall risk and improve function at home and work. 04/20/2022- Patient reports compliant with HEP and no questions  at this time.    Baseline 02/23/2022= No formal HEP in place.    Time 12    Period Weeks    Status Ongoing   Target Date 05/18/22      PT LONG TERM GOAL #2   Title Pt will decrease 5TSTS by at least 8 seconds in order to demonstrate clinically significant improvement in LE strength.    Baseline 02/23/2022= 31.52 sec with B hands on knees; 04/20/2022= 16.08 sec with B hands on knees   Time 12    Period Days    Status Goal Met   Target Date 05/18/22      PT LONG TERM GOAL #3   Title Pt will increase 10MWT by at least 0.23 m/s in order to demonstrate clinically significant improvement in community ambulation.    Baseline 02/23/2022= 0.39 m/s using RW; 0.45 m/s    Time 12    Period Weeks    Status  Improving/Ongoing   Target Date 05/18/22      PT LONG TERM GOAL #4   Title Pt will improve FOTO to target score of 50 to display perceived improvements in ability to complete ADL's.    Baseline 02/23/2022= 47    Time 12    Period Weeks    Status New    Target Date 05/18/22      PT LONG TERM GOAL #5   Title Pt will decrease TUG to below 20 seconds/decrease in order to demonstrate decreased fall risk.    Baseline 02/23/2022=28.27 sec with RW; 04/20/2022= 20.22 with [facility]   Time 12    Period Weeks    Status Ongoing   Target Date 05/18/22              Plan -     Clinical Impression Statement Continued with current plan of care as laid out in evaluation and recent prior sessions. Patient presents with ongoing LE muscle weakness and treatment focused on both seated and standing LE exercises. He demonstrates fatigue but pushes himself to complete as many reps as he can. He did report during visit that he was being  treated for a UTI so this may explain some of weakness noted.  Patient will benefit from skilled PT services to address his LE muscle weakness and imbalance for improved overall functional mobility, improvedbalance, and decrease risk for future falls.     Personal Factors and Comorbidities Age;Past/Current Experience;Time since onset of injury/illness/exacerbation;Comorbidity 3+    Comorbidities COPD, History of Cancer, myasthenia gravis, chronic lumbar surgical history    Examination-Activity Limitations Lift;Squat;Bend;Stairs;Stand;Transfers;Locomotion Level;Reach Overhead;Carry;Bathing;Hygiene/Grooming;Dressing;Toileting;Bed Mobility;Caring for Others;Continence    Examination-Participation Restrictions Yard Work;Church;Cleaning;Driving;Community Activity    Stability/Clinical Decision Making Evolving/Moderate complexity    Rehab Potential Good    Clinical Impairments Affecting Rehab Potential multipe comorbidities    PT Frequency 2x / week    PT Duration 12 weeks    PT Treatment/Interventions ADLs/Self Care Home Management;Electrical Stimulation;Moist Heat;Traction;Ultrasound;Gait training;Stair training;Functional mobility training;Therapeutic activities;Therapeutic exercise;Balance training;Neuromuscular re-education;Patient/family education;Manual techniques;Passive range of motion;Dry needling;Joint Manipulations;Cryotherapy;Vestibular;Canalith Repostioning    PT Next Visit Plan standing strengthening and balance, seated strengthening and coordination    PT Home Exercise Plan Progress LE strengthen and functional endurance   Consulted and Agree with Plan of Care Patient             N  PT   04/25/2022, 11:16 AM    

## 2022-04-27 ENCOUNTER — Ambulatory Visit: Payer: Medicare Other

## 2022-04-27 DIAGNOSIS — R262 Difficulty in walking, not elsewhere classified: Secondary | ICD-10-CM

## 2022-04-27 DIAGNOSIS — G959 Disease of spinal cord, unspecified: Secondary | ICD-10-CM

## 2022-04-27 DIAGNOSIS — R278 Other lack of coordination: Secondary | ICD-10-CM

## 2022-04-27 DIAGNOSIS — R269 Unspecified abnormalities of gait and mobility: Secondary | ICD-10-CM | POA: Diagnosis not present

## 2022-04-27 DIAGNOSIS — R2689 Other abnormalities of gait and mobility: Secondary | ICD-10-CM

## 2022-04-27 DIAGNOSIS — M6281 Muscle weakness (generalized): Secondary | ICD-10-CM

## 2022-04-27 DIAGNOSIS — R2681 Unsteadiness on feet: Secondary | ICD-10-CM

## 2022-04-27 DIAGNOSIS — R296 Repeated falls: Secondary | ICD-10-CM

## 2022-04-27 DIAGNOSIS — G8929 Other chronic pain: Secondary | ICD-10-CM

## 2022-04-27 NOTE — Therapy (Signed)
OUTPATIENT PHYSICAL THERAPY TREATMENT NOTE   Patient Name: Francisco [name] MRN: [id number] DOB:[birth year], 76 y.o., male Today's Date: 04/28/2022  PCP: Adin Hector, MD REFERRING PROVIDER: Meade Maw MD    PT End of Session - 04/27/22 1528     Visit Number 12    Number of Visits 25    Date for PT Re-Evaluation 05/18/22    Authorization Time Period 02/23/2022-05/18/2022    PT Start Time 80    PT Stop Time 1605    PT Time Calculation (min) 50 min    Equipment Utilized During Treatment Gait belt    Activity Tolerance Patient tolerated treatment well    Behavior During Therapy WFL for tasks assessed/performed                  Past Medical History:  Diagnosis Date   Arthritis    lower left hip   Atypical angina (HCC)    Bilateral hand numbness    from back surgery   Bronchitis, chronic ([place])    Cancer ([place])    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; Merkel cell cancer, and Basal cell cancer (twice; 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   Carotid stenosis    CHF (congestive heart failure) (HCC)    CKD (chronic kidney disease)    CKD (chronic kidney disease) stage 3, GFR 30-59 ml/min (HCC)    COPD (chronic obstructive pulmonary disease) ([place])    stage 2   DDD (degenerative disc disease), cervical    Dysrhythmia    post carotid stent bradycardia; PAF 09/2020   GERD (gastroesophageal reflux disease)    Hypercholesterolemia    Hypertension    Hypothyroidism    pt takes Levothyroxine daily   Lumbosacral spinal stenosis    Myasthenia gravis, adult form ([place])    PAD (peripheral artery disease) ([place])    Shortness of breath    Lung MD- Dr Darlin Coco   Sleep apnea    do not use CPAP every night   Past Surgical History:  Procedure Laterality Date   ANTERIOR CERVICAL DECOMP/DISCECTOMY FUSION  07/18/2011   Procedure: ANTERIOR CERVICAL DECOMPRESSION/DISCECTOMY FUSION 2 LEVELS;  Surgeon: Cooper Render Pool;  Location: [facility];  Service: Neurosurgery;  Laterality: N/A;  cervical  five-six, cervical six-seven anterior cervical discectomy and fusion   BACK SURGERY     in [place]     01/2020 Right, 04/2020 Left   CARDIAC CATHETERIZATION     2005 at [facility], no stents   CAROTID PTA/STENT INTERVENTION N/A 09/17/2020   Procedure: CAROTID PTA/STENT INTERVENTION;  Surgeon: Algernon Huxley, MD;  Location: [facility];  Service: Cardiovascular;  Laterality: N/A;   CATARACT EXTRACTION W/PHACO Left 01/06/2020   Procedure: CATARACT EXTRACTION PHACO AND INTRAOCULAR LENS PLACEMENT (IOC) ISTENT INJ LEFT 3.81  00:33.3;  Surgeon: Eulogio Bear, MD;  Location: [place] [facility];  Service: Ophthalmology;  Laterality: Left;   CATARACT EXTRACTION W/PHACO Right 02/03/2020   Procedure: CATARACT EXTRACTION PHACO AND INTRAOCULAR LENS PLACEMENT ([place]) RIGHT ISTENT INJ;  Surgeon: Eulogio Bear, MD;  Location: [facility];  Service: Ophthalmology;  Laterality: Right;  4.29 0:35.6   COLONOSCOPY     HERNIA REPAIR Left    inguinal hernia repair in 1985   LUMBAR LAMINECTOMY/DECOMPRESSION MICRODISCECTOMY Left 02/24/2014   Procedure: LUMBAR LAMINECTOMY/DECOMPRESSION MICRODISCECTOMY LUMBAR THREE-FOUR, FOUR-FIVE, LEFT FIVE-SACRAL ONE ;  Surgeon: Charlie Pitter, MD;  Location: [facility];  Service: Neurosurgery;  Laterality: Left;  LUMBAR LAMINECTOMY/DECOMPRESSION MICRODISCECTOMY LUMBAR THREE-FOUR, FOUR-FIVE, LEFT FIVE-SACRAL ONE    LUMBAR LAMINECTOMY/DECOMPRESSION MICRODISCECTOMY N/A 05/03/2021   Procedure: Laminectomy and Foraminotomy - L2-L3;  Surgeon: Earnie Larsson, MD;  Location: [facility];  Service: Neurosurgery;  Laterality: N/A;  3C   POSTERIOR CERVICAL FUSION/FORAMINOTOMY N/A 08/07/2020   Procedure: C3-6 POSTERIOR FUSION WITH DECOMPRESSION;  Surgeon: Meade Maw, MD;  Location: [facility];  Service: Neurosurgery;  Laterality: N/A;   PROSTATECTOMY  8/14   [facility] Dr Mare Ferrari    Patient Active Problem List   Diagnosis Date Noted   SVT  (supraventricular tachycardia) ([place]) 12/30/2021   Hypothyroidism 12/30/2021   Parkinson's disease ([place]) 12/30/2021   Elevated troponin 12/30/2021   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   Acquired thrombophilia ([place]) 01/13/2021   History of decompression of median nerve 01/01/2021   Paroxysmal atrial fibrillation ([place]) 10/06/2020   Carotid stenosis, right 09/17/2020   S/P cervical spinal fusion    Leukocytosis    Essential hypertension    Anemia of chronic disease    Postoperative pain    Neuropathic pain    Cervical myelopathy ([place]) 08/07/2020   Preop cardiovascular exam 07/17/2020   SOB (shortness of breath) on exertion 07/17/2020   Leg weakness, bilateral 07/13/2020   Aortic atherosclerosis ([place]) 07/09/2020   Body mass index (BMI) 34.0-34.9, adult 12/25/2019   Myalgia 10/30/2019   Lumbar post-laminectomy syndrome 10/24/2019   PAD (peripheral artery disease) ([place]) 06/06/2019   CKD (chronic kidney disease) stage 3, GFR 30-59 ml/min (HCC) 04/29/2019   B12 deficiency 01/23/2019   Left arm numbness 02/28/2018   Neck pain 02/28/2018   Anemia 02/02/2017   DDD (degenerative disc disease), cervical 02/02/2017   Hypothyroid 02/02/2017   MRSA (methicillin resistant staph aureus) culture positive 02/02/2017   Nocturnal hypoxia 02/02/2017   Senile purpura ([place]) 10/03/2016   Essential hypertension, benign 09/16/2016   Bilateral carotid artery disease ([place]) 09/16/2016   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/18/2016   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   History of prostate cancer 12/21/2015   Left carpal tunnel syndrome 11/11/2015   Kidney stone on left side 07/05/2015   Myasthenia gravis ([place]) 05/26/2015   Radiculitis 01/05/2015   Long-term use of high-risk medication 10/15/2014   Persistent cough 09/10/2014   Pure hypercholesterolemia 07/25/2014   Spinal stenosis, lumbar region, with neurogenic claudication 02/24/2014   Lumbosacral stenosis with neurogenic  claudication 02/24/2014   COPD (chronic obstructive pulmonary disease) ([place]) 01/22/2014    REFERRING DIAG: balance disorder   THERAPY DIAG:  Abnormality of gait and mobility  Difficulty in walking, not elsewhere classified  Other abnormalities of gait and mobility  Muscle weakness (generalized)  Other lack of coordination  Repeated falls  Unsteadiness on feet  Chronic bilateral low back pain, unspecified whether sciatica present  Cervical myelopathy ([place])  Rationale for Evaluation and Treatment Rehabilitation  PERTINENT HISTORY: Ben Habermann is a 76 year old male referred to OPPT neuro for difficulty with balance. He was also just recently dx in May 2023 with Impingement syndrome of left shoulder (M75.42) Impingement syndrome of left shoulder (primary encounter diagnosis) Left shoulder pain, unspecified chronicity Nontraumatic rupture of left proximal biceps tendon Past medical hx includes Lumbar laminectomy 04/2021. C3-6 decompression, fusion on 11/26, myasthenia gravis, 2012 ACDF 5/6, 6/7; OSA, hypothyroidism, HTN, COPD, multiple back surgeries, 2021 carpal tunnel release.  PRECAUTIONS: Fall  SUBJECTIVE: Patient reports feeling some better and stronger than his last visit.  PAIN:  Are you having pain? No  TODAY'S TREATMENT:   Therex:  Interval Nustep LE only for mm strength and endurance- VC to maintain > 50 SPM L1- 1 min L2-1 min L3-1 min L4-1 min L5 -2 min  -Sidelye hip clamshell with GTB 2 set of 15 reps   -Hip march 4lb. AW x 10 reps Alt LE -Forward/retro gait in // bars with BUE support and 2.5 lb AW - down and back 4 times focusing on large steps.   -Knee ext x 15 reps using 2.5 lb alt LE   Sit to stand from 24 " mat height - holding onto PVC pipe for feedback to lean forward x 10 reps   -Sit to stand from 22 " mat height- arm crossed at chest x 10 reps   -Seated ham curl with BTB x 12 reps each LE   -Supine bridging with 3 sec hold 2 sets of 10  reps.         Pt required occasional rest breaks due fatigue, PT was quick to ask when pt appeared to be fatiguing in order to prevent excessive fatigue. Pt educated throughout session about proper posture and technique with exercises. Improved exercise technique, movement at target joints, use of target muscles after min to mod verbal, visual, tactile cues.   PATIENT EDUCATION: Education details: HEP, Economist, BERG test Person educated: Patient Education method: Explanation, Demonstration, Tactile cues, Verbal cues, and Handouts Education comprehension: verbalized understanding, returned demonstration, verbal cues required, and tactile cues required   HOME EXERCISE PROGRAM:  No Updates today  Access Code: [email or web address] URL: [email or web address] Date: 03/01/2022 Prepared by: Janna Arch  Exercises - Seated March  - 1 x daily - 7 x weekly - 2 sets - 10 reps - 5 hold - Seated Long Arc Quad  - 1 x daily - 7 x weekly - 2 sets - 10 reps - 5 hold - Seated Hip Abduction  - 1 x daily - 7 x weekly - 2 sets - 10 reps - 5 hold - Seated Hip Adduction Isometrics with Ball  - 1 x daily - 7 x weekly - 2 sets - 10 reps - 5 hold - Seated Gluteal Sets  - 1 x daily - 7 x weekly - 2 sets - 10 reps - 5 hold - Seated Heel Toe Raises  - 1 x daily - 7 x weekly - 2 sets - 10 reps - 5 hold LE strength and balance         PT Short Term Goals       PT SHORT TERM GOAL #1   Title Pt will be independent with initial HEP in order to improve strength and balance in order to decrease fall risk and improve function at home and work.    Baseline 02/23/2022- Patient reports not doing much as far as exercise in the home currently. 04/20/2022- Patient reports compliant with HEP and no questions at this time.    Time 6    Period Weeks    Status Goal met   Target Date 04/06/22              PT Long Term Goals       PT LONG TERM GOAL #1   Title Pt will be independent with final HEP in  order to improve strength and balance in order to decrease fall risk and improve function at home and work. 04/20/2022- Patient reports compliant with HEP and no questions at this time.    Baseline 02/23/2022= No formal HEP  in place.    Time 12    Period Weeks    Status Ongoing   Target Date 05/18/22      PT LONG TERM GOAL #2   Title Pt will decrease 5TSTS by at least 8 seconds in order to demonstrate clinically significant improvement in LE strength.    Baseline 02/23/2022= 31.52 sec with B hands on knees; 04/20/2022= 16.08 sec with B hands on knees   Time 12    Period Days    Status Goal Met   Target Date 05/18/22      PT LONG TERM GOAL #3   Title Pt will increase 10MWT by at least 0.23 m/s in order to demonstrate clinically significant improvement in community ambulation.    Baseline 02/23/2022= 0.39 m/s using RW; 0.45 m/s    Time 12    Period Weeks    Status  Improving/Ongoing   Target Date 05/18/22      PT LONG TERM GOAL #4   Title Pt will improve FOTO to target score of 50 to display perceived improvements in ability to complete ADL's.    Baseline 02/23/2022= 47    Time 12    Period Weeks    Status New    Target Date 05/18/22      PT LONG TERM GOAL #5   Title Pt will decrease TUG to below 20 seconds/decrease in order to demonstrate decreased fall risk.    Baseline 02/23/2022=28.27 sec with RW; 04/20/2022= 20.22 with SBQC   Time 12    Period Weeks    Status Ongoing   Target Date 05/18/22              Plan -     Clinical Impression Statement Continued with current plan of care as laid out in evaluation and recent prior sessions. Patient able to participate well today- able to respond to [facility] for new exercises and able to bridge well overall. Patient reported some fatigue but stated felt like his leg cooperated more today.   Patient will benefit from skilled PT services to address his LE muscle weakness and imbalance for improved overall functional mobility, improvedbalance, and  decrease risk for future falls.     Personal Factors and Comorbidities Age;Past/Current Experience;Time since onset of injury/illness/exacerbation;Comorbidity 3+    Comorbidities COPD, History of Cancer, myasthenia gravis, chronic lumbar surgical history    Examination-Activity Limitations Lift;Squat;Bend;Stairs;Stand;Transfers;Insurance claims handler;Bathing;Hygiene/Grooming;Dressing;Toileting;Bed Mobility;Caring for Others;Continence    Examination-Participation Restrictions Yard Work;Church;Cleaning;Driving;Community Activity    Stability/Clinical Decision Making Evolving/Moderate complexity    Rehab Potential Good    Clinical Impairments Affecting Rehab Potential multipe comorbidities    PT Frequency 2x / week    PT Duration 12 weeks    PT Treatment/Interventions ADLs/Self Care Home Management;Electrical Stimulation;Moist Heat;Traction;Ultrasound;Gait training;Stair training;Functional mobility training;Therapeutic activities;Therapeutic exercise;Balance training;Neuromuscular re-education;Patient/family education;Manual techniques;Passive range of motion;Dry needling;Joint Manipulations;Cryotherapy;Vestibular;Canalith Repostioning    PT Next Visit Plan standing strengthening and balance, seated strengthening and coordination    PT Home Exercise Plan Progress LE strengthen and functional endurance   Consulted and Agree with Plan of Care Patient            Lewis Moccasin PT   04/28/2022, 4:47 PM

## 2022-04-28 ENCOUNTER — Emergency Department: Payer: Medicare Other

## 2022-04-28 ENCOUNTER — Other Ambulatory Visit: Payer: Self-pay

## 2022-04-28 ENCOUNTER — Emergency Department
Admission: EM | Admit: 2022-04-28 | Discharge: 2022-04-28 | Disposition: A | Discharge status: Home / Self Care | Payer: Medicare Other | Attending: Emergency Medicine | Admitting: Emergency Medicine

## 2022-04-28 DIAGNOSIS — I509 Heart failure, unspecified: Secondary | ICD-10-CM | POA: Diagnosis not present

## 2022-04-28 DIAGNOSIS — Z8546 Personal history of malignant neoplasm of prostate: Secondary | ICD-10-CM | POA: Insufficient documentation

## 2022-04-28 DIAGNOSIS — J449 Chronic obstructive pulmonary disease, unspecified: Secondary | ICD-10-CM | POA: Diagnosis not present

## 2022-04-28 DIAGNOSIS — N183 Chronic kidney disease, stage 3 unspecified: Secondary | ICD-10-CM | POA: Insufficient documentation

## 2022-04-28 DIAGNOSIS — I130 Hypertensive heart and chronic kidney disease with heart failure and stage 1 through stage 4 chronic kidney disease, or unspecified chronic kidney disease: Secondary | ICD-10-CM | POA: Diagnosis not present

## 2022-04-28 DIAGNOSIS — E039 Hypothyroidism, unspecified: Secondary | ICD-10-CM | POA: Insufficient documentation

## 2022-04-28 DIAGNOSIS — R531 Weakness: Secondary | ICD-10-CM | POA: Diagnosis present

## 2022-04-28 DIAGNOSIS — N179 Acute kidney failure, unspecified: Secondary | ICD-10-CM | POA: Insufficient documentation

## 2022-04-28 DIAGNOSIS — G20 Parkinson's disease: Secondary | ICD-10-CM | POA: Diagnosis not present

## 2022-04-28 DIAGNOSIS — I471 Supraventricular tachycardia: Secondary | ICD-10-CM | POA: Diagnosis not present

## 2022-04-28 LAB — CBC
HCT: 39.8 % (ref 39.0–52.0)
Hemoglobin: 12.4 g/dL — ABNORMAL LOW (ref 13.0–17.0)
MCH: 34.3 pg — ABNORMAL HIGH (ref 26.0–34.0)
MCHC: 31.2 g/dL (ref 30.0–36.0)
MCV: 109.9 fL — ABNORMAL HIGH (ref 80.0–100.0)
Platelets: 349 10*3/uL (ref 150–400)
RBC: 3.62 MIL/uL — ABNORMAL LOW (ref 4.22–5.81)
RDW: 13.5 % (ref 11.5–15.5)
WBC: 7.5 10*3/uL (ref 4.0–10.5)
nRBC: 0.5 % — ABNORMAL HIGH (ref 0.0–0.2)

## 2022-04-28 LAB — BASIC METABOLIC PANEL
Anion gap: 10 (ref 5–15)
BUN: 38 mg/dL — ABNORMAL HIGH (ref 8–23)
CO2: 26 mmol/L (ref 22–32)
Calcium: 9.6 mg/dL (ref 8.9–10.3)
Chloride: 103 mmol/L (ref 98–111)
Creatinine, Ser: 2.46 mg/dL — ABNORMAL HIGH (ref 0.61–1.24)
GFR, Estimated: 26 mL/min — ABNORMAL LOW (ref >60)
Glucose, Bld: 156 mg/dL — ABNORMAL HIGH (ref 70–99)
Potassium: 5.2 mmol/L — ABNORMAL HIGH (ref 3.5–5.1)
Sodium: 139 mmol/L (ref 135–145)

## 2022-04-28 LAB — PROTIME-INR
INR: 1.4 — ABNORMAL HIGH (ref 0.8–1.2)
Prothrombin Time: 16.7 seconds — ABNORMAL HIGH (ref 11.4–15.2)

## 2022-04-28 MED ORDER — SODIUM ZIRCONIUM CYCLOSILICATE 10 G PO PACK
10.0000 g | PACK | Freq: Once | ORAL | Status: AC
  Administered 2022-04-28: 10 g via ORAL
  Filled 2022-04-28: qty 1

## 2022-04-28 MED ORDER — SODIUM CHLORIDE 0.9 % IV BOLUS
1000.0000 mL | Freq: Once | INTRAVENOUS | Status: AC
  Administered 2022-04-28: 1000 mL via INTRAVENOUS

## 2022-04-28 MED ORDER — ADENOSINE 12 MG/4ML IV SOLN
INTRAVENOUS | Status: AC
  Filled 2022-04-28: qty 4

## 2022-04-28 MED ORDER — ADENOSINE 6 MG/2ML IV SOLN
INTRAVENOUS | Status: AC
  Administered 2022-04-28: 6 mg via INTRAVENOUS
  Filled 2022-04-28: qty 2

## 2022-04-28 NOTE — ED Notes (Signed)
ED Provider at bedside. 

## 2022-04-28 NOTE — ED Provider Notes (Signed)
[facility] Provider Note    Event Date/Time   First MD Initiated Contact with Patient 04/28/22 1108     (approximate)   History   Atrial Fibrillation   HPI  Francisco [name] is a 76 y.o. male past medical history of paroxysmal A-fib, hypothyroidism, myasthenia gravis, SVT, obesity who presents with weakness palpitations.  Several hours prior to arrival patient began feeling very weak and checked his blood pressure and it was low in the 63K systolic and heart rate was in the 170s.  Denies any chest pain or shortness of breath.  Does have abdominal distention and has not had a BM in 1 week despite trying multiple stool softeners enemas and suppositories.  Has been eating and drinking still denies significant abdominal pain.  Last echo from April of this year shows EF of 50 to 55% with mild LVH no diastolic dysfunction.     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   Carotid stenosis    CHF (congestive heart failure) (HCC)    CKD (chronic kidney disease)    CKD (chronic kidney disease) stage 3, GFR 30-59 ml/min (HCC)    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   DDD (degenerative disc disease), cervical    Dysrhythmia    post carotid stent bradycardia; PAF 09/2020   GERD (gastroesophageal reflux disease)    Hypercholesterolemia    Hypertension    Hypothyroidism    pt takes Levothyroxine daily   Lumbosacral spinal stenosis    Myasthenia gravis, adult form ([place])    PAD (peripheral artery disease) (HCC)    Shortness of breath    Lung MD- Dr Darlin Coco   Sleep apnea    do not use CPAP every night    Patient Active Problem List   Diagnosis Date Noted   SVT (supraventricular  tachycardia) ([place]) 12/30/2021   Hypothyroidism 12/30/2021   Parkinson's disease ([place]) 12/30/2021   Elevated troponin 12/30/2021   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   Acquired thrombophilia ([place]) 01/13/2021   History of decompression of median nerve 01/01/2021   Paroxysmal atrial fibrillation ([place]) 10/06/2020   Carotid stenosis, right 09/17/2020   S/P cervical spinal fusion    Leukocytosis    Essential hypertension    Anemia of chronic disease    Postoperative pain    Neuropathic pain    Cervical myelopathy ([place]) 08/07/2020   Preop cardiovascular exam 07/17/2020   SOB (shortness of breath) on exertion 07/17/2020   Leg weakness, bilateral 07/13/2020   Aortic atherosclerosis ([place]) 07/09/2020   Body mass index (BMI) 34.0-34.9, adult 12/25/2019   Myalgia 10/30/2019   Lumbar post-laminectomy syndrome 10/24/2019   PAD (peripheral artery disease) ([place]) 06/06/2019   CKD (chronic kidney disease) stage 3, GFR 30-59 ml/min ([place]) 04/29/2019   B12 deficiency 01/23/2019   Left arm numbness 02/28/2018   Neck pain 02/28/2018   Anemia 02/02/2017   DDD (degenerative disc disease), cervical 02/02/2017   Hypothyroid 02/02/2017   MRSA (methicillin resistant staph aureus) culture positive 02/02/2017   Nocturnal hypoxia 02/02/2017   Senile purpura ([place]) 10/03/2016   Essential hypertension, benign 09/16/2016   Bilateral carotid artery disease (  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   History of prostate cancer 12/21/2015   Left carpal tunnel syndrome 11/11/2015   Kidney stone on left side 07/05/2015   Myasthenia gravis ([place]) 05/26/2015   Radiculitis 01/05/2015   Long-term use of high-risk medication 10/15/2014   Persistent cough 09/10/2014   Pure hypercholesterolemia 07/25/2014   Spinal stenosis, lumbar region, with neurogenic claudication 02/24/2014   Lumbosacral stenosis with neurogenic claudication  02/24/2014   COPD (chronic obstructive pulmonary disease) ([place]) 01/22/2014     Physical Exam  Triage Vital Signs: ED Triage Vitals  Enc Vitals Group     BP 04/28/22 1100 104/77     Pulse Rate 04/28/22 1100 (!) 197     Resp 04/28/22 1100 (!) 21     Temp 04/28/22 1100 98.2 F (36.8 C)     Temp Source 04/28/22 1100 Oral     SpO2 04/28/22 1100 97 %     Weight --      Height --      Head Circumference --      Peak Flow --      Pain Score 04/28/22 1103 0     Pain Loc --      Pain Edu? --      Excl. in [place]? --     Most recent vital signs: Vitals:   04/28/22 1230 04/28/22 1300  BP: 128/77 130/73  Pulse: (!) 55 67  Resp: 12 18  Temp:    SpO2: 100% 95%     General: Awake, no distress.  CV:  Good peripheral perfusion. Tachycardic regular rhythm  Resp:  Normal effort. No increased wob Abd:  No distention.  Abdomen is distended but nontender Neuro:             Awake, Alert, Oriented x 3  Other:     ED Results / Procedures / Treatments  Labs (all labs ordered are listed, but only abnormal results are displayed) Labs Reviewed  BASIC METABOLIC PANEL - Abnormal; Notable for the following components:      Result Value   Potassium 5.2 (*)    Glucose, Bld 156 (*)    BUN 38 (*)    Creatinine, Ser 2.46 (*)    GFR, Estimated 26 (*)    All other components within normal limits  CBC - Abnormal; Notable for the following components:   RBC 3.62 (*)    Hemoglobin 12.4 (*)    MCV 109.9 (*)    MCH 34.3 (*)    nRBC 0.5 (*)    All other components within normal limits  PROTIME-INR - Abnormal; Notable for the following components:   Prothrombin Time 16.7 (*)    INR 1.4 (*)    All other components within normal limits     EKG  EKG interpreted myself shows a regular narrow complex tachycardia normal axis normal intervals ST depression throughout   RADIOLOGY Chest x-ray interpreted myself shows elevation of the right hemidiaphragm   PROCEDURES:  Critical Care performed:  Yes, see critical care procedure note(s)  .1-3 Lead EKG Interpretation  Performed by: Rada Hay, MD Authorized by: Rada Hay, MD     Interpretation: abnormal     ECG rate assessment: tachycardic     Rhythm: SVT     Ectopy: none   .Critical Care  Performed by: Rada Hay, MD Authorized by: Rada Hay, MD   Critical care provider statement:  Critical care time (minutes):  30   Critical care was time spent personally by me on the following activities:  Development of treatment plan with patient or surrogate, discussions with consultants, evaluation of patient's response to treatment, examination of patient, ordering and review of laboratory studies, ordering and review of radiographic studies, ordering and performing treatments and interventions, pulse oximetry, re-evaluation of patient's condition and review of old charts   The patient is on the cardiac monitor to evaluate for evidence of arrhythmia and/or significant heart rate changes.   MEDICATIONS ORDERED IN ED: Medications  adenosine (ADENOCARD) 12 MG/4ML injection (has no administration in time range)  sodium zirconium cyclosilicate (LOKELMA) packet 10 g (has no administration in time range)  adenosine (ADENOCARD) 6 MG/2ML injection (6 mg Intravenous Given 04/28/22 1117)  sodium chloride 0.9 % bolus 1,000 mL (1,000 mLs Intravenous New Bag/Given 04/28/22 1140)     IMPRESSION / MDM / ASSESSMENT AND PLAN / ED COURSE  I reviewed the triage vital signs and the nursing notes.                              Patient's presentation is most consistent with acute presentation with potential threat to life or bodily function.  Differential diagnosis includes, but is not limited to, SVT, bowel obstruction, constipation The patient is a 76 year old male with underlying history of SVT paroxysmal A-fib presenting with weakness palpitations low blood pressure at home.  On arrival his heart rate is in the 180s  EKG showing an SVT.  Has had this before and converted with adenosine last time he was here.  Blood pressure is on the low end of normal.  He denies chest pain or shortness of breath does complain of some abdominal distention and constipation for the last week.  Abdomen is distended on exam minimally tender.  Does not appear volume overloaded.  He was given 6 mg of adenosine and immediately converted to normal sinus rhythm with some atrial ectopy.  He has a AKI on labs with creatinine 2.4 from baseline of around 1.4.  May be a little volume down so we will give a liter of fluid.  With the constipation hypotension we will check CT abdomen pelvis without contrast given his renal injury  CT of the abdomen pelvis is negative for abnormality.  There is also not significant stool burden.  Patient remained in normal sinus rhythm here.  On further discussion with the patient he has been on Bactrim recently for UTI which I think could explain the elevation in creatinine and hyperkalemia.  He is now off of the Bactrim last dose was yesterday.  Patient has normal urine output.  We will give a dose of Lokelma in the ED and have him follow-up with his primary doctor.  Reviewed his medications he is not I reviewed his medications he tells me he does not think he is taking the spironolactone I recommend that if he goes home and sees that that is one of the medications he is actively taking that he hold that due to his hyperkalemia until he can follow-up for repeat BMP in about 5 to 7 days.      FINAL CLINICAL IMPRESSION(S) / ED DIAGNOSES   Final diagnoses:  AKI (acute kidney injury) ([place])  SVT (supraventricular tachycardia) ([place])     Rx / DC Orders   ED Discharge Orders     None        Note:  This document was prepared using Dragon voice recognition software and may include unintentional dictation errors.   Rada Hay, MD 04/28/22 [phone]

## 2022-04-28 NOTE — Discharge Instructions (Signed)
You are in SVT today and are now back in normal sinus rhythm.  Please follow-up with Dr. Saralyn Pilar' office to discuss further treatment.  Please start taking the stool softeners MiraLAX twice a day and senna until you are having regular bowel movements.  Your kidney function was reduced today which could be from the Bactrim which you are taking for UTI.  If you are taking spironolactone at home please do not take this until you can follow-up with your primary doctor.  Please follow-up with your primary doctor in about 5 to 7 days to have a repeat basic metabolic checked to ensure that your kidney function is improving and that your potassium is lowering.

## 2022-04-28 NOTE — ED Triage Notes (Signed)
Pt comes with c/o afib rate in 180s per [place]. Pt went to see doc about no BM in 4 days. Pt states no pain or SOb.  Pt has hx of afib, copd.  EKG performed and Current rate is 188, Charge Rn informed

## 2022-04-28 NOTE — ED Notes (Signed)
Pt verbalized understanding of discharge instructions. Opportunity for questions provided.  

## 2022-05-03 ENCOUNTER — Ambulatory Visit: Payer: Medicare Other

## 2022-05-03 DIAGNOSIS — R262 Difficulty in walking, not elsewhere classified: Secondary | ICD-10-CM

## 2022-05-03 DIAGNOSIS — M6281 Muscle weakness (generalized): Secondary | ICD-10-CM

## 2022-05-03 DIAGNOSIS — R278 Other lack of coordination: Secondary | ICD-10-CM

## 2022-05-03 DIAGNOSIS — G959 Disease of spinal cord, unspecified: Secondary | ICD-10-CM

## 2022-05-03 DIAGNOSIS — R269 Unspecified abnormalities of gait and mobility: Secondary | ICD-10-CM | POA: Diagnosis not present

## 2022-05-03 DIAGNOSIS — G8929 Other chronic pain: Secondary | ICD-10-CM

## 2022-05-03 DIAGNOSIS — R2681 Unsteadiness on feet: Secondary | ICD-10-CM

## 2022-05-03 DIAGNOSIS — R2689 Other abnormalities of gait and mobility: Secondary | ICD-10-CM

## 2022-05-03 DIAGNOSIS — R296 Repeated falls: Secondary | ICD-10-CM

## 2022-05-03 NOTE — Therapy (Signed)
OUTPATIENT PHYSICAL THERAPY TREATMENT NOTE   Patient Name: Francisco [name] MRN: [id number] DOB:[birth year], 76 y.o., male Today's Date: 05/03/2022  PCP: Adin Hector, MD REFERRING PROVIDER: Meade Maw MD    PT End of Session - 05/03/22 1523     Visit Number 13    Number of Visits 25    Date for PT Re-Evaluation 05/18/22    Authorization Time Period 02/23/2022-05/18/2022    PT Start Time 28    PT Stop Time 1553    PT Time Calculation (min) 41 min    Equipment Utilized During Treatment Gait belt    Activity Tolerance Patient tolerated treatment well    Behavior During Therapy 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   Carotid stenosis    CHF (congestive heart failure) (HCC)    CKD (chronic kidney disease)    CKD (chronic kidney disease) stage 3, GFR 30-59 ml/min (HCC)    COPD (chronic obstructive pulmonary disease) ([place])    stage 2   DDD (degenerative disc disease), cervical    Dysrhythmia    post carotid stent bradycardia; PAF 09/2020   GERD (gastroesophageal reflux disease)    Hypercholesterolemia    Hypertension    Hypothyroidism    pt takes Levothyroxine daily   Lumbosacral spinal stenosis    Myasthenia gravis, adult form ([place])    PAD (peripheral artery disease) ([place])    Shortness of breath    Lung MD- Dr Darlin Coco   Sleep apnea    do not use CPAP every night   Past Surgical History:  Procedure Laterality Date   ANTERIOR CERVICAL DECOMP/DISCECTOMY FUSION  07/18/2011   Procedure: ANTERIOR CERVICAL DECOMPRESSION/DISCECTOMY FUSION 2 LEVELS;  Surgeon: Cooper Render Pool;  Location: [place] [facility];  Service: Neurosurgery;  Laterality: N/A;  cervical  five-six, cervical six-seven anterior cervical discectomy and fusion   BACK SURGERY     in [place]     01/2020 Right, 04/2020 Left   CARDIAC CATHETERIZATION     2005 at [facility], no stents   CAROTID PTA/STENT INTERVENTION N/A 09/17/2020   Procedure: CAROTID PTA/STENT INTERVENTION;  Surgeon: Algernon Huxley, MD;  Location: [place] CV LAB;  Service: Cardiovascular;  Laterality: N/A;   CATARACT EXTRACTION W/PHACO Left 01/06/2020   Procedure: CATARACT EXTRACTION PHACO AND INTRAOCULAR LENS PLACEMENT (IOC) ISTENT INJ LEFT 3.81  00:33.3;  Surgeon: Eulogio Bear, MD;  Location: [place];  Service: Ophthalmology;  Laterality: Left;   CATARACT EXTRACTION W/PHACO Right 02/03/2020   Procedure: CATARACT EXTRACTION PHACO AND INTRAOCULAR LENS PLACEMENT ([place]) RIGHT ISTENT INJ;  Surgeon: Eulogio Bear, MD;  Location: [place];  Service: Ophthalmology;  Laterality: Right;  4.29 0:35.6   COLONOSCOPY     HERNIA REPAIR Left    inguinal hernia repair in 1985   LUMBAR LAMINECTOMY/DECOMPRESSION MICRODISCECTOMY Left 02/24/2014   Procedure: LUMBAR LAMINECTOMY/DECOMPRESSION MICRODISCECTOMY LUMBAR THREE-FOUR, FOUR-FIVE, LEFT FIVE-SACRAL ONE ;  Surgeon: Charlie Pitter, MD;  Location: [place] [facility]S;  Service: Neurosurgery;  Laterality: Left;  LUMBAR LAMINECTOMY/DECOMPRESSION MICRODISCECTOMY LUMBAR THREE-FOUR, FOUR-FIVE, LEFT FIVE-SACRAL ONE    LUMBAR LAMINECTOMY/DECOMPRESSION MICRODISCECTOMY N/A 05/03/2021   Procedure: Laminectomy and Foraminotomy - L2-L3;  Surgeon: Earnie Larsson, MD;  Location: [place];  Service: Neurosurgery;  Laterality: N/A;  3C   POSTERIOR CERVICAL FUSION/FORAMINOTOMY N/A 08/07/2020   Procedure: C3-6 POSTERIOR FUSION WITH DECOMPRESSION;  Surgeon: Meade Maw, MD;  Location: [facility] ORS;  Service: Neurosurgery;  Laterality: N/A;   PROSTATECTOMY  8/14   [facility] Dr Mare Ferrari    Patient Active Problem List   Diagnosis Date Noted   SVT  (supraventricular tachycardia) ([place]) 12/30/2021   Hypothyroidism 12/30/2021   Parkinson's disease ([place]) 12/30/2021   Elevated troponin 12/30/2021   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   Acquired thrombophilia ([place]) 01/13/2021   History of decompression of median nerve 01/01/2021   Paroxysmal atrial fibrillation ([place]) 10/06/2020   Carotid stenosis, right 09/17/2020   S/P cervical spinal fusion    Leukocytosis    Essential hypertension    Anemia of chronic disease    Postoperative pain    Neuropathic pain    Cervical myelopathy ([place]) 08/07/2020   Preop cardiovascular exam 07/17/2020   SOB (shortness of breath) on exertion 07/17/2020   Leg weakness, bilateral 07/13/2020   Aortic atherosclerosis ([place]) 07/09/2020   Body mass index (BMI) 34.0-34.9, adult 12/25/2019   Myalgia 10/30/2019   Lumbar post-laminectomy syndrome 10/24/2019   PAD (peripheral artery disease) ([place]) 06/06/2019   CKD (chronic kidney disease) stage 3, GFR 30-59 ml/min (HCC) 04/29/2019   B12 deficiency 01/23/2019   Left arm numbness 02/28/2018   Neck pain 02/28/2018   Anemia 02/02/2017   DDD (degenerative disc disease), cervical 02/02/2017   Hypothyroid 02/02/2017   MRSA (methicillin resistant staph aureus) culture positive 02/02/2017   Nocturnal hypoxia 02/02/2017   Senile purpura ([place]) 10/03/2016   Essential hypertension, benign 09/16/2016   Bilateral carotid artery disease ([place]) 09/16/2016   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/18/2016   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   History of prostate cancer 12/21/2015   Left carpal tunnel syndrome 11/11/2015   Kidney stone on left side 07/05/2015   Myasthenia gravis ([place]) 05/26/2015   Radiculitis 01/05/2015   Long-term use of high-risk medication 10/15/2014   Persistent cough 09/10/2014   Pure hypercholesterolemia 07/25/2014   Spinal stenosis, lumbar region, with neurogenic claudication 02/24/2014   Lumbosacral stenosis with neurogenic  claudication 02/24/2014   COPD (chronic obstructive pulmonary disease) ([place]) 01/22/2014    REFERRING DIAG: balance disorder   THERAPY DIAG:  Difficulty in walking, not elsewhere classified  Other abnormalities of gait and mobility  Muscle weakness (generalized)  Other lack of coordination  Repeated falls  Unsteadiness on feet  Chronic bilateral low back pain, unspecified whether sciatica present  Cervical myelopathy ([place])  Rationale for Evaluation and Treatment Rehabilitation  PERTINENT HISTORY: Nocholas Damaso is a 76 year old male referred to [facility] neuro for difficulty with balance. He was also just recently dx in May 2023 with Impingement syndrome of left shoulder (M75.42) Impingement syndrome of left shoulder (primary encounter diagnosis) Left shoulder pain, unspecified chronicity Nontraumatic rupture of left proximal biceps tendon Past medical hx includes Lumbar laminectomy 04/2021. C3-6 decompression, fusion on 11/26, myasthenia gravis, 2012 ACDF 5/6, 6/7; OSA, hypothyroidism, HTN, COPD, multiple back surgeries, 2021 carpal tunnel release.  PRECAUTIONS: Fall  SUBJECTIVE: Patient reports he went to ED last week with continued weakness - but states going to his cardiologist and his PCP this week.  PAIN:  Are you having  pain? No     TODAY'S TREATMENT:   Therex:  Interval Nustep LE only for mm strength and endurance- VC to maintain > 50 SPM L1- 2 min L2- 5 min   -Sitting LE combo- kick leg up and around in 360 circle (around 2 cones positioned in front of foot) - Strenuous   -Hip march 4lb. AW x 12 reps Alt LE -   -Knee ext x 15 reps using 3 lb alt LE   Sit to stand from 24 " mat height - holding onto PVC pipe for feedback to lean forward x 10 reps   -Sit to stand from varying descending height of mat:  -24 in= 4 reps -23 in = 4 reps -22 in = 4 reps arm crossed at chest    -Seated ham curl with RTB x 12 reps each LE   -Seated hip add with ball squeeze x 5 sec x  10     Pt required occasional rest breaks due fatigue, PT was quick to ask when pt appeared to be fatiguing in order to prevent excessive fatigue. Pt educated throughout session about proper posture and technique with exercises. Improved exercise technique, movement at target joints, use of target muscles after min to mod verbal, visual, tactile cues.   PATIENT EDUCATION: Education details: HEP, Economist, BERG test Person educated: Patient Education method: Explanation, Demonstration, Tactile cues, Verbal cues, and Handouts Education comprehension: verbalized understanding, returned demonstration, verbal cues required, and tactile cues required   HOME EXERCISE PROGRAM:  No Updates today  Access Code: [email or web address] URL: [email or web address] Date: 03/01/2022 Prepared by: Janna Arch  Exercises - Seated March  - 1 x daily - 7 x weekly - 2 sets - 10 reps - 5 hold - Seated Long Arc Quad  - 1 x daily - 7 x weekly - 2 sets - 10 reps - 5 hold - Seated Hip Abduction  - 1 x daily - 7 x weekly - 2 sets - 10 reps - 5 hold - Seated Hip Adduction Isometrics with Ball  - 1 x daily - 7 x weekly - 2 sets - 10 reps - 5 hold - Seated Gluteal Sets  - 1 x daily - 7 x weekly - 2 sets - 10 reps - 5 hold - Seated Heel Toe Raises  - 1 x daily - 7 x weekly - 2 sets - 10 reps - 5 hold LE strength and balance         PT Short Term Goals       PT SHORT TERM GOAL #1   Title Pt will be independent with initial HEP in order to improve strength and balance in order to decrease fall risk and improve function at home and work.    Baseline 02/23/2022- Patient reports not doing much as far as exercise in the home currently. 04/20/2022- Patient reports compliant with HEP and no questions at this time.    Time 6    Period Weeks    Status Goal met   Target Date 04/06/22              PT Long Term Goals       PT LONG TERM GOAL #1   Title Pt will be independent with final HEP in order to  improve strength and balance in order to decrease fall risk and improve function at home and work. 04/20/2022- Patient reports compliant with HEP and no questions at this time.    Baseline 02/23/2022= No formal  HEP in place.    Time 12    Period Weeks    Status Ongoing   Target Date 05/18/22      PT LONG TERM GOAL #2   Title Pt will decrease 5TSTS by at least 8 seconds in order to demonstrate clinically significant improvement in LE strength.    Baseline 02/23/2022= 31.52 sec with B hands on knees; 04/20/2022= 16.08 sec with B hands on knees   Time 12    Period Days    Status Goal Met   Target Date 05/18/22      PT LONG TERM GOAL #3   Title Pt will increase 10MWT by at least 0.23 m/s in order to demonstrate clinically significant improvement in community ambulation.    Baseline 02/23/2022= 0.39 m/s using RW; 0.45 m/s    Time 12    Period Weeks    Status  Improving/Ongoing   Target Date 05/18/22      PT LONG TERM GOAL #4   Title Pt will improve FOTO to target score of 50 to display perceived improvements in ability to complete ADL's.    Baseline 02/23/2022= 47    Time 12    Period Weeks    Status New    Target Date 05/18/22      PT LONG TERM GOAL #5   Title Pt will decrease TUG to below 20 seconds/decrease in order to demonstrate decreased fall risk.    Baseline 02/23/2022=28.27 sec with RW; 04/20/2022= 20.22 with SB[facility]   Time 12    Period Weeks    Status Ongoing   Target Date 05/18/22              Plan -     Clinical Impression Statement Continued with current plan of care as laid out in evaluation and recent prior sessions. Patient more fatigued overall today so treatment focused more on seated therex with some resistance. PT ensured to modify plan as necessary based on patient response to exercise. Patient was limited due to weakness and fatigue overall today more than previous sessions. Patient will benefit from skilled PT services to address his LE muscle weakness and imbalance  for improved overall functional mobility, improvedbalance, and decrease risk for future falls.     Personal Factors and Comorbidities Age;Past/Current Experience;Time since onset of injury/illness/exacerbation;Comorbidity 3+    Comorbidities COPD, History of Cancer, myasthenia gravis, chronic lumbar surgical history    Examination-Activity Limitations Lift;Squat;Bend;Stairs;Stand;Transfers;Insurance claims handler;Bathing;Hygiene/Grooming;Dressing;Toileting;Bed Mobility;Caring for Others;Continence    Examination-Participation Restrictions Yard Work;Church;Cleaning;Driving;Community Activity    Stability/Clinical Decision Making Evolving/Moderate complexity    Rehab Potential Good    Clinical Impairments Affecting Rehab Potential multipe comorbidities    PT Frequency 2x / week    PT Duration 12 weeks    PT Treatment/Interventions ADLs/Self Care Home Management;Electrical Stimulation;Moist Heat;Traction;Ultrasound;Gait training;Stair training;Functional mobility training;Therapeutic activities;Therapeutic exercise;Balance training;Neuromuscular re-education;Patient/family education;Manual techniques;Passive range of motion;Dry needling;Joint Manipulations;Cryotherapy;Vestibular;Canalith Repostioning    PT Next Visit Plan standing strengthening and balance, seated strengthening and coordination    PT Home Exercise Plan Progress LE strengthen and functional endurance   Consulted and Agree with Plan of Care Patient            Lewis Moccasin PT   05/03/2022, 4:03 PM

## 2022-05-05 ENCOUNTER — Ambulatory Visit: Payer: Medicare Other

## 2022-05-05 DIAGNOSIS — R296 Repeated falls: Secondary | ICD-10-CM

## 2022-05-05 DIAGNOSIS — R2681 Unsteadiness on feet: Secondary | ICD-10-CM

## 2022-05-05 DIAGNOSIS — M6281 Muscle weakness (generalized): Secondary | ICD-10-CM

## 2022-05-05 DIAGNOSIS — R269 Unspecified abnormalities of gait and mobility: Secondary | ICD-10-CM

## 2022-05-05 DIAGNOSIS — G8929 Other chronic pain: Secondary | ICD-10-CM

## 2022-05-05 DIAGNOSIS — R262 Difficulty in walking, not elsewhere classified: Secondary | ICD-10-CM

## 2022-05-05 DIAGNOSIS — R2689 Other abnormalities of gait and mobility: Secondary | ICD-10-CM

## 2022-05-05 DIAGNOSIS — G959 Disease of spinal cord, unspecified: Secondary | ICD-10-CM

## 2022-05-05 DIAGNOSIS — R278 Other lack of coordination: Secondary | ICD-10-CM

## 2022-05-05 NOTE — Therapy (Signed)
OUTPATIENT PHYSICAL THERAPY TREATMENT NOTE   Patient Name: Francisco [name] MRN: [id number] DOB:1946/01/22, 76 y.o., male Today's Date: 05/05/2022  PCP: Adin Hector, MD REFERRING PROVIDER: Meade Maw MD    PT End of Session - 05/05/22 1600     Visit Number 14    Number of Visits 25    Date for PT Re-Evaluation 05/18/22    Authorization Time Period 02/23/2022-05/18/2022    PT Start Time 1555    PT Stop Time 1642    PT Time Calculation (min) 47 min    Equipment Utilized During Treatment Gait belt    Activity Tolerance Patient tolerated treatment well    Behavior During Therapy WFL for tasks assessed/performed                  Past Medical History:  Diagnosis Date   Arthritis    lower left hip   Atypical angina (HCC)    Bilateral hand numbness    from back surgery   Bronchitis, chronic ([place])    Cancer ([place])    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; Merkel cell cancer, and Basal cell cancer (twice; 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   Carotid stenosis    CHF (congestive heart failure) (HCC)    CKD (chronic kidney disease)    CKD (chronic kidney disease) stage 3, GFR 30-59 ml/min (HCC)    COPD (chronic obstructive pulmonary disease) ([place])    stage 2   DDD (degenerative disc disease), cervical    Dysrhythmia    post carotid stent bradycardia; PAF 09/2020   GERD (gastroesophageal reflux disease)    Hypercholesterolemia    Hypertension    Hypothyroidism    pt takes Levothyroxine daily   Lumbosacral spinal stenosis    Myasthenia gravis, adult form ([place])    PAD (peripheral artery disease) ([place])    Shortness of breath    Lung MD- Dr Darlin Coco   Sleep apnea    do not use CPAP every night   Past Surgical History:  Procedure Laterality Date   ANTERIOR CERVICAL DECOMP/DISCECTOMY FUSION  07/18/2011   Procedure: ANTERIOR CERVICAL DECOMPRESSION/DISCECTOMY FUSION 2 LEVELS;  Surgeon: Cooper Render Pool;  Location: [place] [facility];  Service: Neurosurgery;  Laterality: N/A;  cervical  five-six, cervical six-seven anterior cervical discectomy and fusion   BACK SURGERY     [place]     01/2020 Right, 04/2020 Left   CARDIAC CATHETERIZATION     2005 at [facility], no stents   CAROTID PTA/STENT INTERVENTION N/A 09/17/2020   Procedure: CAROTID PTA/STENT INTERVENTION;  Surgeon: Algernon Huxley, MD;  Location: [place] [facility];  Service: Cardiovascular;  Laterality: N/A;   CATARACT EXTRACTION W/PHACO Left 01/06/2020   Procedure: CATARACT EXTRACTION PHACO AND INTRAOCULAR LENS PLACEMENT (IOC) ISTENT INJ LEFT 3.81  00:33.3;  Surgeon: Eulogio Bear, MD;  Location: [place];  Service: Ophthalmology;  Laterality: Left;   CATARACT EXTRACTION W/PHACO Right 02/03/2020   Procedure: CATARACT EXTRACTION PHACO AND INTRAOCULAR LENS PLACEMENT ([place]) RIGHT ISTENT INJ;  Surgeon: Eulogio Bear, MD;  Location: [place];  Service: Ophthalmology;  Laterality: Right;  4.29 0:35.6   COLONOSCOPY     HERNIA REPAIR Left    inguinal hernia repair in 1985   LUMBAR LAMINECTOMY/DECOMPRESSION MICRODISCECTOMY Left 02/24/2014   Procedure: LUMBAR LAMINECTOMY/DECOMPRESSION MICRODISCECTOMY LUMBAR THREE-FOUR, FOUR-FIVE, LEFT FIVE-SACRAL ONE ;  Surgeon: Charlie Pitter, MD;  Location: [place] [facility];  Service: Neurosurgery;  Laterality: Left;  LUMBAR LAMINECTOMY/DECOMPRESSION MICRODISCECTOMY LUMBAR THREE-FOUR, FOUR-FIVE, LEFT FIVE-SACRAL ONE    LUMBAR LAMINECTOMY/DECOMPRESSION MICRODISCECTOMY N/A 05/03/2021   Procedure: Laminectomy and Foraminotomy - L2-L3;  Surgeon: Earnie Larsson, MD;  Location: [place];  Service: Neurosurgery;  Laterality: N/A;  3C   POSTERIOR CERVICAL FUSION/FORAMINOTOMY N/A 08/07/2020   Procedure: C3-6 POSTERIOR FUSION WITH DECOMPRESSION;  Surgeon: Meade Maw, MD;  Location: [facility] ORS;  Service: Neurosurgery;  Laterality: N/A;   PROSTATECTOMY  8/14   [facility] Dr Mare Ferrari    Patient Active Problem List   Diagnosis Date Noted   SVT  (supraventricular tachycardia) ([place]) 12/30/2021   Hypothyroidism 12/30/2021   Parkinson's disease ([place]) 12/30/2021   Elevated troponin 12/30/2021   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   Acquired thrombophilia ([place]) 01/13/2021   History of decompression of median nerve 01/01/2021   Paroxysmal atrial fibrillation ([place]) 10/06/2020   Carotid stenosis, right 09/17/2020   S/P cervical spinal fusion    Leukocytosis    Essential hypertension    Anemia of chronic disease    Postoperative pain    Neuropathic pain    Cervical myelopathy ([place]) 08/07/2020   Preop cardiovascular exam 07/17/2020   SOB (shortness of breath) on exertion 07/17/2020   Leg weakness, bilateral 07/13/2020   Aortic atherosclerosis ([place]) 07/09/2020   Body mass index (BMI) 34.0-34.9, adult 12/25/2019   Myalgia 10/30/2019   Lumbar post-laminectomy syndrome 10/24/2019   PAD (peripheral artery disease) ([place]) 06/06/2019   CKD (chronic kidney disease) stage 3, GFR 30-59 ml/min (HCC) 04/29/2019   B12 deficiency 01/23/2019   Left arm numbness 02/28/2018   Neck pain 02/28/2018   Anemia 02/02/2017   DDD (degenerative disc disease), cervical 02/02/2017   Hypothyroid 02/02/2017   MRSA (methicillin resistant staph aureus) culture positive 02/02/2017   Nocturnal hypoxia 02/02/2017   Senile purpura ([place]) 10/03/2016   Essential hypertension, benign 09/16/2016   Bilateral carotid artery disease ([place]) 09/16/2016   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/18/2016   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   History of prostate cancer 12/21/2015   Left carpal tunnel syndrome 11/11/2015   Kidney stone on left side 07/05/2015   Myasthenia gravis ([place]) 05/26/2015   Radiculitis 01/05/2015   Long-term use of high-risk medication 10/15/2014   Persistent cough 09/10/2014   Pure hypercholesterolemia 07/25/2014   Spinal stenosis, lumbar region, with neurogenic claudication 02/24/2014   Lumbosacral stenosis with neurogenic  claudication 02/24/2014   COPD (chronic obstructive pulmonary disease) ([place]) 01/22/2014    REFERRING DIAG: balance disorder   THERAPY DIAG:  Difficulty in walking, not elsewhere classified  Other abnormalities of gait and mobility  Muscle weakness (generalized)  Other lack of coordination  Repeated falls  Unsteadiness on feet  Chronic bilateral low back pain, unspecified whether sciatica present  Cervical myelopathy (HCC)  Abnormality of gait and mobility  Rationale for Evaluation and Treatment Rehabilitation  PERTINENT HISTORY: Haji Delaine is a 76 year old male referred to O[facility]T neuro for difficulty with balance. He was also just recently dx in May 2023 with Impingement syndrome of left shoulder (M75.42) Impingement syndrome of left shoulder (primary encounter diagnosis) Left shoulder pain, unspecified chronicity Nontraumatic rupture of left proximal biceps tendon Past medical hx includes Lumbar laminectomy 04/2021. C3-6 decompression, fusion on 11/26, myasthenia gravis, 2012 ACDF 5/6, 6/7; OSA, hypothyroidism, HTN, COPD, multiple back surgeries, 2021 carpal tunnel release.  PRECAUTIONS: Fall  SUBJECTIVE: Patient reports feeling much better with more energy since yesterday. Reports he was seen by his cardiologist yestday and to wear a halter  monitor for next week.  PAIN:  Are you having pain? No     TODAY'S TREATMENT:   Therex:  Interval Nustep LE only for mm strength and endurance- VC to maintain > 50 SPM (seat at 9)  L1- 2 min L2- 2 min L3- 3 min   -Forward stepping in // bars - stepping over 3 (1/2 foam rolls) - down and back x 4 rounds-stopped due to fatigue (HR= 110 bpm) -Seated UE Horizontal shoulder abd with BTB-  2 x 15 reps BUE- "Man, that is tough- my chest is really stretching- hard to hold my arms up." -Side step in // bars - stepping over 3 (1/2 foam rolls) - down and back x 3 rounds- stopped due to fatigue (HR= 113 bpm)  - Seated Scap rows with BTB  2 sets of 12 reps  -Sit to stand with arms outstretched holding a 1Kg yellow ball x 10 reps (HR=115 bpm)  - Arnold press (bicep curl +shoulder flex) x 10 reps x 1 set; 10  reps x 1 set - Attempted standing (staggered with back leg on green therapad and front LE on 6" block) - he was able to hold inconsistent today from 4-20 sec but experienced some dizziness so terminated activity.       Pt required occasional rest breaks due fatigue, PT was quick to ask when pt appeared to be fatiguing in order to prevent excessive fatigue. Pt educated throughout session about proper posture and technique with exercises. Improved exercise technique, movement at target joints, use of target muscles after min to mod verbal, visual, tactile cues.   PATIENT EDUCATION: Education details: HEP, Economist, BERG test Person educated: Patient Education method: Explanation, Demonstration, Tactile cues, Verbal cues, and Handouts Education comprehension: verbalized understanding, returned demonstration, verbal cues required, and tactile cues required   HOME EXERCISE PROGRAM:  No Updates today  Access Code: [email or web address] URL: [email or web address] Date: 03/01/2022 Prepared by: Janna Arch  Exercises - Seated March  - 1 x daily - 7 x weekly - 2 sets - 10 reps - 5 hold - Seated Long Arc Quad  - 1 x daily - 7 x weekly - 2 sets - 10 reps - 5 hold - Seated Hip Abduction  - 1 x daily - 7 x weekly - 2 sets - 10 reps - 5 hold - Seated Hip Adduction Isometrics with Ball  - 1 x daily - 7 x weekly - 2 sets - 10 reps - 5 hold - Seated Gluteal Sets  - 1 x daily - 7 x weekly - 2 sets - 10 reps - 5 hold - Seated Heel Toe Raises  - 1 x daily - 7 x weekly - 2 sets - 10 reps - 5 hold LE strength and balance         PT Short Term Goals       PT SHORT TERM GOAL #1   Title Pt will be independent with initial HEP in order to improve strength and balance in order to decrease fall risk and improve function at  home and work.    Baseline 02/23/2022- Patient reports not doing much as far as exercise in the home currently. 04/20/2022- Patient reports compliant with HEP and no questions at this time.    Time 6    Period Weeks    Status Goal met   Target Date 04/06/22              PT Long Term Goals  PT LONG TERM GOAL #1   Title Pt will be independent with final HEP in order to improve strength and balance in order to decrease fall risk and improve function at home and work. 04/20/2022- Patient reports compliant with HEP and no questions at this time.    Baseline 02/23/2022= No formal HEP in place.    Time 12    Period Weeks    Status Ongoing   Target Date 05/18/22      PT LONG TERM GOAL #2   Title Pt will decrease 5TSTS by at least 8 seconds in order to demonstrate clinically significant improvement in LE strength.    Baseline 02/23/2022= 31.52 sec with B hands on knees; 04/20/2022= 16.08 sec with B hands on knees   Time 12    Period Days    Status Goal Met   Target Date 05/18/22      PT LONG TERM GOAL #3   Title Pt will increase 10MWT by at least 0.23 m/s in order to demonstrate clinically significant improvement in community ambulation.    Baseline 02/23/2022= 0.39 m/s using RW; 0.45 m/s    Time 12    Period Weeks    Status  Improving/Ongoing   Target Date 05/18/22      PT LONG TERM GOAL #4   Title Pt will improve FOTO to target score of 50 to display perceived improvements in ability to complete ADL's.    Baseline 02/23/2022= 47    Time 12    Period Weeks    Status New    Target Date 05/18/22      PT LONG TERM GOAL #5   Title Pt will decrease TUG to below 20 seconds/decrease in order to demonstrate decreased fall risk.    Baseline 02/23/2022=28.27 sec with RW; 04/20/2022= 20.22 with [facility]   Time 12    Period Weeks    Status Ongoing   Target Date 05/18/22              Plan -     Clinical Impression Statement Continued with current plan of care as laid out in evaluation  and recent prior sessions. Patient started off strong today and able to stand better and clearing feet with steps. He did however exhibit increased fatigue with exertion. He was able to recover with brief rest breaks yet progressively became more tired throughout session. He was closely monitored and provided rest breaks accordingly.  Patient will benefit from skilled PT services to address his LE muscle weakness and imbalance for improved overall functional mobility, improvedbalance, and decrease risk for future falls.     Personal Factors and Comorbidities Age;Past/Current Experience;Time since onset of injury/illness/exacerbation;Comorbidity 3+    Comorbidities COPD, History of Cancer, myasthenia gravis, chronic lumbar surgical history    Examination-Activity Limitations Lift;Squat;Bend;Stairs;Stand;Transfers;Insurance claims handler;Bathing;Hygiene/Grooming;Dressing;Toileting;Bed Mobility;Caring for Others;Continence    Examination-Participation Restrictions Yard Work;Church;Cleaning;Driving;Community Activity    Stability/Clinical Decision Making Evolving/Moderate complexity    Rehab Potential Good    Clinical Impairments Affecting Rehab Potential multipe comorbidities    PT Frequency 2x / week    PT Duration 12 weeks    PT Treatment/Interventions ADLs/Self Care Home Management;Electrical Stimulation;Moist Heat;Traction;Ultrasound;Gait training;Stair training;Functional mobility training;Therapeutic activities;Therapeutic exercise;Balance training;Neuromuscular re-education;Patient/family education;Manual techniques;Passive range of motion;Dry needling;Joint Manipulations;Cryotherapy;Vestibular;Canalith Repostioning    PT Next Visit Plan standing strengthening and balance, seated strengthening and coordination    PT Home Exercise Plan Progress LE strengthen and functional endurance   Consulted and Agree with Plan of Care Patient  Kathlee Nations Blake Goya PT   05/05/2022,  5:26 PM

## 2022-05-09 ENCOUNTER — Ambulatory Visit: Payer: Medicare Other

## 2022-05-09 DIAGNOSIS — R269 Unspecified abnormalities of gait and mobility: Secondary | ICD-10-CM | POA: Diagnosis not present

## 2022-05-09 DIAGNOSIS — M6281 Muscle weakness (generalized): Secondary | ICD-10-CM

## 2022-05-09 DIAGNOSIS — R262 Difficulty in walking, not elsewhere classified: Secondary | ICD-10-CM

## 2022-05-09 DIAGNOSIS — R2689 Other abnormalities of gait and mobility: Secondary | ICD-10-CM

## 2022-05-09 NOTE — Therapy (Signed)
OUTPATIENT PHYSICAL THERAPY TREATMENT NOTE   Patient Name: Francisco [name] MRN: [id number] DOB:06/05/1946, 76 y.o., male Today's Date: 05/09/2022  PCP: Adin Hector, MD REFERRING PROVIDER: Meade Maw MD    PT End of Session - 05/09/22 1012     Visit Number 15    Number of Visits 25    Date for PT Re-Evaluation 05/18/22    Authorization Time Period 02/23/2022-05/18/2022    PT Start Time 1015    PT Stop Time 1058    PT Time Calculation (min) 43 min    Equipment Utilized During Treatment Gait belt    Activity Tolerance Patient tolerated treatment well    Behavior During Therapy [facility] for tasks assessed/performed                   Past Medical History:  Diagnosis Date   Arthritis    lower left hip   Atypical angina (HCC)    Bilateral hand numbness    from back surgery   Bronchitis, chronic ([place])    Cancer ([place])    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; Merkel cell cancer, and Basal cell cancer (twice; 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   Carotid stenosis    CHF (congestive heart failure) (HCC)    CKD (chronic kidney disease)    CKD (chronic kidney disease) stage 3, GFR 30-59 ml/min (HCC)    COPD (chronic obstructive pulmonary disease) ([place])    stage 2   DDD (degenerative disc disease), cervical    Dysrhythmia    post carotid stent bradycardia; PAF 09/2020   GERD (gastroesophageal reflux disease)    Hypercholesterolemia    Hypertension    Hypothyroidism    pt takes Levothyroxine daily   Lumbosacral spinal stenosis    Myasthenia gravis, adult form ([place])    PAD (peripheral artery disease) ([place])    Shortness of breath    Lung MD- Dr Darlin Coco   Sleep apnea    do not use CPAP every night   Past Surgical History:  Procedure Laterality Date   ANTERIOR CERVICAL DECOMP/DISCECTOMY FUSION  07/18/2011   Procedure: ANTERIOR CERVICAL DECOMPRESSION/DISCECTOMY FUSION 2 LEVELS;  Surgeon: Cooper Render Pool;  Location: [place] [facility];  Service: Neurosurgery;  Laterality: N/A;  cervical  five-six, cervical six-seven anterior cervical discectomy and fusion   BACK SURGERY     in [place]     01/2020 Right, 04/2020 Left   CARDIAC CATHETERIZATION     2005 at [facility], no stents   CAROTID PTA/STENT INTERVENTION N/A 09/17/2020   Procedure: CAROTID PTA/STENT INTERVENTION;  Surgeon: Algernon Huxley, MD;  Location: [name] [facility];  Service: Cardiovascular;  Laterality: N/A;   CATARACT EXTRACTION W/PHACO Left 01/06/2020   Procedure: CATARACT EXTRACTION PHACO AND INTRAOCULAR LENS PLACEMENT (IOC) ISTENT INJ LEFT 3.81  00:33.3;  Surgeon: Eulogio Bear, MD;  Location: [place];  Service: Ophthalmology;  Laterality: Left;   CATARACT EXTRACTION W/PHACO Right 02/03/2020   Procedure: CATARACT EXTRACTION PHACO AND INTRAOCULAR LENS PLACEMENT ([place]) RIGHT ISTENT INJ;  Surgeon: Eulogio Bear, MD;  Location: [place];  Service: Ophthalmology;  Laterality: Right;  4.29 0:35.6   COLONOSCOPY     HERNIA REPAIR Left    inguinal hernia repair in 1985   LUMBAR LAMINECTOMY/DECOMPRESSION MICRODISCECTOMY Left 02/24/2014   Procedure: LUMBAR LAMINECTOMY/DECOMPRESSION MICRODISCECTOMY LUMBAR THREE-FOUR, FOUR-FIVE, LEFT FIVE-SACRAL ONE ;  Surgeon: Charlie Pitter, MD;  Location: [place] [facility];  Service: Neurosurgery;  Laterality: Left;  LUMBAR LAMINECTOMY/DECOMPRESSION MICRODISCECTOMY LUMBAR THREE-FOUR, FOUR-FIVE, LEFT FIVE-SACRAL ONE    LUMBAR LAMINECTOMY/DECOMPRESSION MICRODISCECTOMY N/A 05/03/2021   Procedure: Laminectomy and Foraminotomy - L2-L3;  Surgeon: Earnie Larsson, MD;  Location: [facility];  Service: Neurosurgery;  Laterality: N/A;  3C   POSTERIOR CERVICAL FUSION/FORAMINOTOMY N/A 08/07/2020   Procedure: C3-6 POSTERIOR FUSION WITH DECOMPRESSION;  Surgeon: Meade Maw, MD;  Location: [facility]S;  Service: Neurosurgery;  Laterality: N/A;   PROSTATECTOMY  8/14   [facility] Dr Mare Ferrari    Patient Active Problem List   Diagnosis Date Noted   SVT  (supraventricular tachycardia) ([place]) 12/30/2021   Hypothyroidism 12/30/2021   Parkinson's disease ([place]) 12/30/2021   Elevated troponin 12/30/2021   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   Acquired thrombophilia ([place]) 01/13/2021   History of decompression of median nerve 01/01/2021   Paroxysmal atrial fibrillation ([place]) 10/06/2020   Carotid stenosis, right 09/17/2020   S/P cervical spinal fusion    Leukocytosis    Essential hypertension    Anemia of chronic disease    Postoperative pain    Neuropathic pain    Cervical myelopathy ([place]) 08/07/2020   Preop cardiovascular exam 07/17/2020   SOB (shortness of breath) on exertion 07/17/2020   Leg weakness, bilateral 07/13/2020   Aortic atherosclerosis ([place]) 07/09/2020   Body mass index (BMI) 34.0-34.9, adult 12/25/2019   Myalgia 10/30/2019   Lumbar post-laminectomy syndrome 10/24/2019   PAD (peripheral artery disease) ([place]) 06/06/2019   CKD (chronic kidney disease) stage 3, GFR 30-59 ml/min (HCC) 04/29/2019   B12 deficiency 01/23/2019   Left arm numbness 02/28/2018   Neck pain 02/28/2018   Anemia 02/02/2017   DDD (degenerative disc disease), cervical 02/02/2017   Hypothyroid 02/02/2017   MRSA (methicillin resistant staph aureus) culture positive 02/02/2017   Nocturnal hypoxia 02/02/2017   Senile purpura ([place]) 10/03/2016   Essential hypertension, benign 09/16/2016   Bilateral carotid artery disease ([place]) 09/16/2016   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/18/2016   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   History of prostate cancer 12/21/2015   Left carpal tunnel syndrome 11/11/2015   Kidney stone on left side 07/05/2015   Myasthenia gravis ([place]) 05/26/2015   Radiculitis 01/05/2015   Long-term use of high-risk medication 10/15/2014   Persistent cough 09/10/2014   Pure hypercholesterolemia 07/25/2014   Spinal stenosis, lumbar region, with neurogenic claudication 02/24/2014   Lumbosacral stenosis with neurogenic  claudication 02/24/2014   COPD (chronic obstructive pulmonary disease) ([place]) 01/22/2014    REFERRING DIAG: balance disorder   THERAPY DIAG:  Difficulty in walking, not elsewhere classified  Other abnormalities of gait and mobility  Muscle weakness (generalized)  Rationale for Evaluation and Treatment Rehabilitation  PERTINENT HISTORY: Kalden Wanke is a 76 year old male referred to OPPT neuro for difficulty with balance. He was also just recently dx in May 2023 with Impingement syndrome of left shoulder (M75.42) Impingement syndrome of left shoulder (primary encounter diagnosis) Left shoulder pain, unspecified chronicity Nontraumatic rupture of left proximal biceps tendon Past medical hx includes Lumbar laminectomy 04/2021. C3-6 decompression, fusion on 11/26, myasthenia gravis, 2012 ACDF 5/6, 6/7; OSA, hypothyroidism, HTN, COPD, multiple back surgeries, 2021 carpal tunnel release.  PRECAUTIONS: Fall  SUBJECTIVE: Patient reports having a hard time getting up and going.   PAIN:  Are you having pain? No     TODAY'S TREATMENT:   BP at start of session: 108/61 standing 102/55  Therex:    Seated hip march 10x each LE   Seated abduction into GTB 20x  Sit to stand 5x   Standing in // bars :  - marching 4x length of // bars ; BUE support ; seated rest break between -side step 4x length of // bars ; last two without UE support -backward walking 2x length of // bars    Neuro Re-ed: Standing with CGA next to support surface:  Airex pad: static stand 30 seconds x 2 trials, noticeable trembling of ankles/LE's with fatigue and challenge to maintain stability Airex pad: horizontal head turns 30 seconds scanning room 10x ; cueing for arc of motion  Airex pad: vertical head turns 30 seconds, cueing for arc of motion, noticeable sway with upward gaze increasing demand on ankle righting reaction musculature Airex pad: one foot on 6" step one foot on airex pad, hold position for  30 seconds, switch legs, 2x each LE;  Pt required occasional rest breaks due fatigue, PT was quick to ask when pt appeared to be fatiguing in order to prevent excessive fatigue. Pt educated throughout session about proper posture and technique with exercises. Improved exercise technique, movement at target joints, use of target muscles after min to mod verbal, visual, tactile cues.   PATIENT EDUCATION: Education details: HEP, Economist, BERG test Person educated: Patient Education method: Explanation, Demonstration, Tactile cues, Verbal cues, and Handouts Education comprehension: verbalized understanding, returned demonstration, verbal cues required, and tactile cues required   HOME EXERCISE PROGRAM:  No Updates today  Access Code: [email or web address] URL: [email or web address] Date: 03/01/2022 Prepared by: Janna Arch  Exercises - Seated March  - 1 x daily - 7 x weekly - 2 sets - 10 reps - 5 hold - Seated Long Arc Quad  - 1 x daily - 7 x weekly - 2 sets - 10 reps - 5 hold - Seated Hip Abduction  - 1 x daily - 7 x weekly - 2 sets - 10 reps - 5 hold - Seated Hip Adduction Isometrics with Ball  - 1 x daily - 7 x weekly - 2 sets - 10 reps - 5 hold - Seated Gluteal Sets  - 1 x daily - 7 x weekly - 2 sets - 10 reps - 5 hold - Seated Heel Toe Raises  - 1 x daily - 7 x weekly - 2 sets - 10 reps - 5 hold LE strength and balance         PT Short Term Goals       PT SHORT TERM GOAL #1   Title Pt will be independent with initial HEP in order to improve strength and balance in order to decrease fall risk and improve function at home and work.    Baseline 02/23/2022- Patient reports not doing much as far as exercise in the home currently. 04/20/2022- Patient reports compliant with HEP and no questions at this time.    Time 6    Period Weeks    Status Goal met   Target Date 04/06/22              PT Long Term Goals       PT LONG TERM GOAL #1   Title Pt will be  independent with final HEP in order to improve strength and balance in order to decrease fall risk and improve function at home and work. 04/20/2022- Patient reports compliant with HEP and no questions at this time.    Baseline 02/23/2022= No formal HEP in place.    Time 12    Period Weeks    Status Ongoing  Target Date 05/18/22      PT LONG TERM GOAL #2   Title Pt will decrease 5TSTS by at least 8 seconds in order to demonstrate clinically significant improvement in LE strength.    Baseline 02/23/2022= 31.52 sec with B hands on knees; 04/20/2022= 16.08 sec with B hands on knees   Time 12    Period Days    Status Goal Met   Target Date 05/18/22      PT LONG TERM GOAL #3   Title Pt will increase 10MWT by at least 0.23 m/s in order to demonstrate clinically significant improvement in community ambulation.    Baseline 02/23/2022= 0.39 m/s using RW; 0.45 m/s    Time 12    Period Weeks    Status  Improving/Ongoing   Target Date 05/18/22      PT LONG TERM GOAL #4   Title Pt will improve FOTO to target score of 50 to display perceived improvements in ability to complete ADL's.    Baseline 02/23/2022= 47    Time 12    Period Weeks    Status New    Target Date 05/18/22      PT LONG TERM GOAL #5   Title Pt will decrease TUG to below 20 seconds/decrease in order to demonstrate decreased fall risk.    Baseline 02/23/2022=28.27 sec with RW; 04/20/2022= 20.22 with SBQC   Time 12    Period Weeks    Status Ongoing   Target Date 05/18/22              Plan -     Clinical Impression Statement Patient requires constant monitoring of vitals due to lower blood pressure and fatigue in LE's. Patient is highly motivated despite not feeling well.  Patient will benefit from skilled PT services to address his LE muscle weakness and imbalance for improved overall functional mobility, improvedbalance, and decrease risk for future falls.     Personal Factors and Comorbidities Age;Past/Current Experience;Time  since onset of injury/illness/exacerbation;Comorbidity 3+    Comorbidities COPD, History of Cancer, myasthenia gravis, chronic lumbar surgical history    Examination-Activity Limitations Lift;Squat;Bend;Stairs;Stand;Transfers;Insurance claims handler;Bathing;Hygiene/Grooming;Dressing;Toileting;Bed Mobility;Caring for Others;Continence    Examination-Participation Restrictions Yard Work;Church;Cleaning;Driving;Community Activity    Stability/Clinical Decision Making Evolving/Moderate complexity    Rehab Potential Good    Clinical Impairments Affecting Rehab Potential multipe comorbidities    PT Frequency 2x / week    PT Duration 12 weeks    PT Treatment/Interventions ADLs/Self Care Home Management;Electrical Stimulation;Moist Heat;Traction;Ultrasound;Gait training;Stair training;Functional mobility training;Therapeutic activities;Therapeutic exercise;Balance training;Neuromuscular re-education;Patient/family education;Manual techniques;Passive range of motion;Dry needling;Joint Manipulations;Cryotherapy;Vestibular;Canalith Repostioning    PT Next Visit Plan standing strengthening and balance, seated strengthening and coordination    PT Home Exercise Plan Progress LE strengthen and functional endurance   Consulted and Agree with Plan of Care Patient            Janna Arch PT   05/09/2022, 10:58 AM

## 2022-05-10 ENCOUNTER — Ambulatory Visit: Payer: Medicare Other

## 2022-05-12 ENCOUNTER — Ambulatory Visit: Payer: Medicare Other

## 2022-05-12 DIAGNOSIS — G8929 Other chronic pain: Secondary | ICD-10-CM

## 2022-05-12 DIAGNOSIS — R269 Unspecified abnormalities of gait and mobility: Secondary | ICD-10-CM | POA: Diagnosis not present

## 2022-05-12 DIAGNOSIS — R278 Other lack of coordination: Secondary | ICD-10-CM

## 2022-05-12 DIAGNOSIS — G959 Disease of spinal cord, unspecified: Secondary | ICD-10-CM

## 2022-05-12 DIAGNOSIS — R2681 Unsteadiness on feet: Secondary | ICD-10-CM

## 2022-05-12 DIAGNOSIS — M6281 Muscle weakness (generalized): Secondary | ICD-10-CM

## 2022-05-12 DIAGNOSIS — R296 Repeated falls: Secondary | ICD-10-CM

## 2022-05-12 DIAGNOSIS — R2689 Other abnormalities of gait and mobility: Secondary | ICD-10-CM

## 2022-05-12 DIAGNOSIS — R262 Difficulty in walking, not elsewhere classified: Secondary | ICD-10-CM

## 2022-05-12 NOTE — Therapy (Signed)
OUTPATIENT PHYSICAL THERAPY TREATMENT NOTE   Patient Name: JWAN HORNBAKER MRN: [id number] DOB:1946-02-07, 76 y.o., male Today's Date: 05/12/2022  PCP: Adin Hector, MD REFERRING PROVIDER: Meade Maw MD    PT End of Session - 05/12/22 1524     Visit Number 16    Number of Visits 25    Date for PT Re-Evaluation 05/18/22    Authorization Time Period 02/23/2022-05/18/2022    PT Start Time 1515    PT Stop Time 1600    PT Time Calculation (min) 45 min    Equipment Utilized During Treatment Gait belt    Activity Tolerance Patient tolerated treatment well    Behavior During Therapy [facility] for tasks assessed/performed                    Past Medical History:  Diagnosis Date   Arthritis    lower left hip   Atypical angina (HCC)    Bilateral hand numbness    from back surgery   Bronchitis, chronic ([place])    Cancer ([place])    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; Merkel cell cancer, and Basal cell cancer (twice; 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   Carotid stenosis    CHF (congestive heart failure) (HCC)    CKD (chronic kidney disease)    CKD (chronic kidney disease) stage 3, GFR 30-59 ml/min (HCC)    COPD (chronic obstructive pulmonary disease) ([place])    stage 2   DDD (degenerative disc disease), cervical    Dysrhythmia    post carotid stent bradycardia; PAF 09/2020   GERD (gastroesophageal reflux disease)    Hypercholesterolemia    Hypertension    Hypothyroidism    pt takes Levothyroxine daily   Lumbosacral spinal stenosis    Myasthenia gravis, adult form ([place])    PAD (peripheral artery disease) ([place])    Shortness of breath    Lung MD- Dr Darlin Coco   Sleep apnea    do not use CPAP every night   Past Surgical History:  Procedure Laterality Date   ANTERIOR CERVICAL DECOMP/DISCECTOMY FUSION  07/18/2011   Procedure: ANTERIOR CERVICAL DECOMPRESSION/DISCECTOMY FUSION 2 LEVELS;  Surgeon: Cooper Render Pool;  Location: [place] [facility];  Service: Neurosurgery;  Laterality: N/A;  cervical  five-six, cervical six-seven anterior cervical discectomy and fusion   BACK SURGERY     in [place]     01/2020 Right, 04/2020 Left   CARDIAC CATHETERIZATION     2005 at [facility], no stents   CAROTID PTA/STENT INTERVENTION N/A 09/17/2020   Procedure: CAROTID PTA/STENT INTERVENTION;  Surgeon: Algernon Huxley, MD;  Location: [place] [facility];  Service: Cardiovascular;  Laterality: N/A;   CATARACT EXTRACTION W/PHACO Left 01/06/2020   Procedure: CATARACT EXTRACTION PHACO AND INTRAOCULAR LENS PLACEMENT (IOC) ISTENT INJ LEFT 3.81  00:33.3;  Surgeon: Eulogio Bear, MD;  Location: [place];  Service: Ophthalmology;  Laterality: Left;   CATARACT EXTRACTION W/PHACO Right 02/03/2020   Procedure: CATARACT EXTRACTION PHACO AND INTRAOCULAR LENS PLACEMENT ([place]) RIGHT ISTENT INJ;  Surgeon: Eulogio Bear, MD;  Location: [place];  Service: Ophthalmology;  Laterality: Right;  4.29 0:35.6   COLONOSCOPY     HERNIA REPAIR Left    inguinal hernia repair in 1985   LUMBAR LAMINECTOMY/DECOMPRESSION MICRODISCECTOMY Left 02/24/2014   Procedure: LUMBAR LAMINECTOMY/DECOMPRESSION MICRODISCECTOMY LUMBAR THREE-FOUR, FOUR-FIVE, LEFT FIVE-SACRAL ONE ;  Surgeon: Charlie Pitter, MD;  Location: [place] [facility];  Service: Neurosurgery;  Laterality:  Left;  LUMBAR LAMINECTOMY/DECOMPRESSION MICRODISCECTOMY LUMBAR THREE-FOUR, FOUR-FIVE, LEFT FIVE-SACRAL ONE    LUMBAR LAMINECTOMY/DECOMPRESSION MICRODISCECTOMY N/A 05/03/2021   Procedure: Laminectomy and Foraminotomy - L2-L3;  Surgeon: Julio Sicks, MD;  Location: [facility];  Service: Neurosurgery;  Laterality: N/A;  3C   POSTERIOR CERVICAL FUSION/FORAMINOTOMY N/A 08/07/2020   Procedure: C3-6 POSTERIOR FUSION WITH DECOMPRESSION;  Surgeon: Venetia Night, MD;  Location: [facility];  Service: Neurosurgery;  Laterality: N/A;   PROSTATECTOMY  8/14   [facility] Dr Joycelyn Das    Patient Active Problem List   Diagnosis Date Noted   SVT  (supraventricular tachycardia) (HCC) 12/30/2021   Hypothyroidism 12/30/2021   Parkinson's disease (HCC) 12/30/2021   Elevated troponin 12/30/2021   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   Acquired thrombophilia (HCC) 01/13/2021   History of decompression of median nerve 01/01/2021   Paroxysmal atrial fibrillation (HCC) 10/06/2020   Carotid stenosis, right 09/17/2020   S/P cervical spinal fusion    Leukocytosis    Essential hypertension    Anemia of chronic disease    Postoperative pain    Neuropathic pain    Cervical myelopathy (HCC) 08/07/2020   Preop cardiovascular exam 07/17/2020   SOB (shortness of breath) on exertion 07/17/2020   Leg weakness, bilateral 07/13/2020   Aortic atherosclerosis (HCC) 07/09/2020   Body mass index (BMI) 34.0-34.9, adult 12/25/2019   Myalgia 10/30/2019   Lumbar post-laminectomy syndrome 10/24/2019   PAD (peripheral artery disease) (HCC) 06/06/2019   CKD (chronic kidney disease) stage 3, GFR 30-59 ml/min (HCC) 04/29/2019   B12 deficiency 01/23/2019   Left arm numbness 02/28/2018   Neck pain 02/28/2018   Anemia 02/02/2017   DDD (degenerative disc disease), cervical 02/02/2017   Hypothyroid 02/02/2017   MRSA (methicillin resistant staph aureus) culture positive 02/02/2017   Nocturnal hypoxia 02/02/2017   Senile purpura (HCC) 10/03/2016   Essential hypertension, benign 09/16/2016   Bilateral carotid artery disease (HCC) 09/16/2016   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/18/2016   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   History of prostate cancer 12/21/2015   Left carpal tunnel syndrome 11/11/2015   Kidney stone on left side 07/05/2015   Myasthenia gravis (HCC) 05/26/2015   Radiculitis 01/05/2015   Long-term use of high-risk medication 10/15/2014   Persistent cough 09/10/2014   Pure hypercholesterolemia 07/25/2014   Spinal stenosis, lumbar region, with neurogenic claudication 02/24/2014   Lumbosacral stenosis with neurogenic  claudication 02/24/2014   COPD (chronic obstructive pulmonary disease) (HCC) 01/22/2014    REFERRING DIAG: balance disorder   THERAPY DIAG:  Difficulty in walking, not elsewhere classified  Other abnormalities of gait and mobility  Muscle weakness (generalized)  Other lack of coordination  Repeated falls  Unsteadiness on feet  Chronic bilateral low back pain, unspecified whether sciatica present  Cervical myelopathy (HCC)  Rationale for Evaluation and Treatment Rehabilitation  PERTINENT HISTORY: Dayn Barich is a 76 year old male referred to [facility] neuro for difficulty with balance. He was also just recently dx in May 2023 with Impingement syndrome of left shoulder (M75.42) Impingement syndrome of left shoulder (primary encounter diagnosis) Left shoulder pain, unspecified chronicity Nontraumatic rupture of left proximal biceps tendon Past medical hx includes Lumbar laminectomy 04/2021. C3-6 decompression, fusion on 11/26, myasthenia gravis, 2012 ACDF 5/6, 6/7; OSA, hypothyroidism, HTN, COPD, multiple back surgeries, 2021 carpal tunnel release.  PRECAUTIONS: Fall  SUBJECTIVE: Patient reports feeling weak and having good days and bad days. He brought in a home video on his phone of him walking across kitchen floor  PAIN:  Are you having pain? No     TODAY'S TREATMENT:   Therex:   Nustep HIIT: LE only at seat 10  Level 1 for 2 min Level 4 for 30 sec Level 1 for 30 sec Level 4 for 30 sec Level 1 for 29min Level 4 for 30 sec Level 1 for 2 min Total distance = 0.25 mi    Seated hip march 10x each LE with 2.5 # AW- Seated knee ext  with 2.5 AW (Author held out a 1/2 foam roll for feedback on height of kick) - x 10 rep Seated - start in lumbar flexed position holding a 2kg ball toward floor- raise arms/ball up overhead for lumar/thoracic ext with shoulder flex) x 10 reps Amb overground with SBQC, CGA- 2.5# AW x 2 trials: 1) 57 feet (stopped due to fatigue) 2) 96 feet   *2 min rest break  Seated abduction into GTB 10 reps x 2 sets Amb overground with SBQC, CGA x 130 feet (stopped due to fatigue)         Pt required occasional rest breaks due fatigue, PT was quick to ask when pt appeared to be fatiguing in order to prevent excessive fatigue. Pt educated throughout session about proper posture and technique with exercises. Improved exercise technique, movement at target joints, use of target muscles after min to mod verbal, visual, tactile cues.   PATIENT EDUCATION: Education details: Exercise technique Person educated: Patient Education method: Explanation, Demonstration, Tactile cues, Verbal cues, and Handouts Education comprehension: verbalized understanding, returned demonstration, verbal cues required, and tactile cues required   HOME EXERCISE PROGRAM:  No Updates today  Access Code: [email or web address] URL: [email or web address] Date: 03/01/2022 Prepared by: Janna Arch  Exercises - Seated March  - 1 x daily - 7 x weekly - 2 sets - 10 reps - 5 hold - Seated Long Arc Quad  - 1 x daily - 7 x weekly - 2 sets - 10 reps - 5 hold - Seated Hip Abduction  - 1 x daily - 7 x weekly - 2 sets - 10 reps - 5 hold - Seated Hip Adduction Isometrics with Ball  - 1 x daily - 7 x weekly - 2 sets - 10 reps - 5 hold - Seated Gluteal Sets  - 1 x daily - 7 x weekly - 2 sets - 10 reps - 5 hold - Seated Heel Toe Raises  - 1 x daily - 7 x weekly - 2 sets - 10 reps - 5 hold LE strength and balance         PT Short Term Goals       PT SHORT TERM GOAL #1   Title Pt will be independent with initial HEP in order to improve strength and balance in order to decrease fall risk and improve function at home and work.    Baseline 02/23/2022- Patient reports not doing much as far as exercise in the home currently. 04/20/2022- Patient reports compliant with HEP and no questions at this time.    Time 6    Period Weeks    Status Goal met   Target Date 04/06/22               PT Long Term Goals       PT LONG TERM GOAL #1   Title Pt will be independent with final HEP in order to improve strength and balance in order to decrease fall risk and improve function at home and work. 04/20/2022- Patient  reports compliant with HEP and no questions at this time.    Baseline 02/23/2022= No formal HEP in place.    Time 12    Period Weeks    Status Ongoing   Target Date 05/18/22      PT LONG TERM GOAL #2   Title Pt will decrease 5TSTS by at least 8 seconds in order to demonstrate clinically significant improvement in LE strength.    Baseline 02/23/2022= 31.52 sec with B hands on knees; 04/20/2022= 16.08 sec with B hands on knees   Time 12    Period Days    Status Goal Met   Target Date 05/18/22      PT LONG TERM GOAL #3   Title Pt will increase 10MWT by at least 0.23 m/s in order to demonstrate clinically significant improvement in community ambulation.    Baseline 02/23/2022= 0.39 m/s using RW; 0.45 m/s    Time 12    Period Weeks    Status  Improving/Ongoing   Target Date 05/18/22      PT LONG TERM GOAL #4   Title Pt will improve FOTO to target score of 50 to display perceived improvements in ability to complete ADL's.    Baseline 02/23/2022= 47    Time 12    Period Weeks    Status New    Target Date 05/18/22      PT LONG TERM GOAL #5   Title Pt will decrease TUG to below 20 seconds/decrease in order to demonstrate decreased fall risk.    Baseline 02/23/2022=28.27 sec with RW; 04/20/2022= 20.22 with SBQC   Time 12    Period Weeks    Status Ongoing   Target Date 05/18/22              Plan -     Clinical Impression Statement Patient remains highly motivated despite ongoing LE muscle weakness. He was visibly more fatigued with decreased ambulation distance and more shuffling with use of AW today. He pushes himself to complete all therex but very fatigued at end of session. Treatment modified to more seated therex to accommodate fatigue level.   Patient will benefit from skilled PT services to address his LE muscle weakness and imbalance for improved overall functional mobility, improvedbalance, and decrease risk for future falls.     Personal Factors and Comorbidities Age;Past/Current Experience;Time since onset of injury/illness/exacerbation;Comorbidity 3+    Comorbidities COPD, History of Cancer, myasthenia gravis, chronic lumbar surgical history    Examination-Activity Limitations Lift;Squat;Bend;Stairs;Stand;Transfers;Insurance claims handler;Bathing;Hygiene/Grooming;Dressing;Toileting;Bed Mobility;Caring for Others;Continence    Examination-Participation Restrictions Yard Work;Church;Cleaning;Driving;Community Activity    Stability/Clinical Decision Making Evolving/Moderate complexity    Rehab Potential Good    Clinical Impairments Affecting Rehab Potential multipe comorbidities    PT Frequency 2x / week    PT Duration 12 weeks    PT Treatment/Interventions ADLs/Self Care Home Management;Electrical Stimulation;Moist Heat;Traction;Ultrasound;Gait training;Stair training;Functional mobility training;Therapeutic activities;Therapeutic exercise;Balance training;Neuromuscular re-education;Patient/family education;Manual techniques;Passive range of motion;Dry needling;Joint Manipulations;Cryotherapy;Vestibular;Canalith Repostioning    PT Next Visit Plan standing strengthening and balance, seated strengthening and coordination    PT Home Exercise Plan Progress LE strengthen and functional endurance   Consulted and Agree with Plan of Care Patient            Lewis Moccasin PT   05/12/2022, 5:17 PM

## 2022-05-17 ENCOUNTER — Ambulatory Visit: Payer: Medicare Other | Attending: Neurosurgery

## 2022-05-17 DIAGNOSIS — G8929 Other chronic pain: Secondary | ICD-10-CM | POA: Diagnosis present

## 2022-05-17 DIAGNOSIS — R262 Difficulty in walking, not elsewhere classified: Secondary | ICD-10-CM

## 2022-05-17 DIAGNOSIS — M6281 Muscle weakness (generalized): Secondary | ICD-10-CM | POA: Diagnosis present

## 2022-05-17 DIAGNOSIS — R2689 Other abnormalities of gait and mobility: Secondary | ICD-10-CM

## 2022-05-17 DIAGNOSIS — M545 Low back pain, unspecified: Secondary | ICD-10-CM

## 2022-05-17 DIAGNOSIS — R278 Other lack of coordination: Secondary | ICD-10-CM | POA: Diagnosis present

## 2022-05-17 DIAGNOSIS — R296 Repeated falls: Secondary | ICD-10-CM

## 2022-05-17 DIAGNOSIS — R2681 Unsteadiness on feet: Secondary | ICD-10-CM | POA: Diagnosis present

## 2022-05-17 DIAGNOSIS — R269 Unspecified abnormalities of gait and mobility: Secondary | ICD-10-CM | POA: Diagnosis present

## 2022-05-17 DIAGNOSIS — G959 Disease of spinal cord, unspecified: Secondary | ICD-10-CM | POA: Diagnosis present

## 2022-05-17 NOTE — Therapy (Signed)
OUTPATIENT PHYSICAL THERAPY TREATMENT NOTE   Patient Name: Francisco [name] MRN: [id number] DOB:[birth year], 76 y.o., male Today's Date: 05/18/2022  PCP: Adin Hector, MD REFERRING PROVIDER: Meade Maw MD    PT End of Session - 05/17/22 1529     Visit Number 17    Number of Visits 25    Date for PT Re-Evaluation 05/18/22    Authorization Time Period 02/23/2022-05/18/2022    Progress Note Due on Visit 98    PT Start Time 1518    PT Stop Time 4782    PT Time Calculation (min) 37 min    Equipment Utilized During Treatment Gait belt    Activity Tolerance Patient tolerated treatment well    Behavior During Therapy WFL for tasks assessed/performed                     Past Medical History:  Diagnosis Date   Arthritis    lower left hip   Atypical angina (HCC)    Bilateral hand numbness    from back surgery   Bronchitis, chronic (HCC)    Cancer ([place])    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; Merkel cell cancer, and Basal cell cancer (twice; 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   Carotid stenosis    CHF (congestive heart failure) (HCC)    CKD (chronic kidney disease)    CKD (chronic kidney disease) stage 3, GFR 30-59 ml/min (HCC)    COPD (chronic obstructive pulmonary disease) ([place])    stage 2   DDD (degenerative disc disease), cervical    Dysrhythmia    post carotid stent bradycardia; PAF 09/2020   GERD (gastroesophageal reflux disease)    Hypercholesterolemia    Hypertension    Hypothyroidism    pt takes Levothyroxine daily   Lumbosacral spinal stenosis    Myasthenia gravis, adult form ([place])    PAD (peripheral artery disease) ([place])    Shortness of breath    Lung MD- Dr Darlin Coco   Sleep apnea    do not use CPAP every night   Past Surgical History:  Procedure Laterality Date   ANTERIOR CERVICAL DECOMP/DISCECTOMY FUSION  07/18/2011   Procedure: ANTERIOR CERVICAL DECOMPRESSION/DISCECTOMY FUSION 2 LEVELS;  Surgeon: Cooper Render Pool;  Location: [facility];  Service:  Neurosurgery;  Laterality: N/A;  cervical five-six, cervical six-seven anterior cervical discectomy and fusion   BACK SURGERY     in [place]     01/2020 Right, 04/2020 Left   CARDIAC CATHETERIZATION     2005 at [facility], no stents   CAROTID PTA/STENT INTERVENTION N/A 09/17/2020   Procedure: CAROTID PTA/STENT INTERVENTION;  Surgeon: Algernon Huxley, MD;  Location: [facility];  Service: Cardiovascular;  Laterality: N/A;   CATARACT EXTRACTION W/PHACO Left 01/06/2020   Procedure: CATARACT EXTRACTION PHACO AND INTRAOCULAR LENS PLACEMENT (IOC) ISTENT INJ LEFT 3.81  00:33.3;  Surgeon: Eulogio Bear, MD;  Location: [facility];  Service: Ophthalmology;  Laterality: Left;   CATARACT EXTRACTION W/PHACO Right 02/03/2020   Procedure: CATARACT EXTRACTION PHACO AND INTRAOCULAR LENS PLACEMENT ([place]) RIGHT ISTENT INJ;  Surgeon: Eulogio Bear, MD;  Location: [facility];  Service: Ophthalmology;  Laterality: Right;  4.29 0:35.6   COLONOSCOPY     HERNIA REPAIR Left    inguinal hernia repair in 1985   LUMBAR LAMINECTOMY/DECOMPRESSION MICRODISCECTOMY Left 02/24/2014   Procedure: LUMBAR LAMINECTOMY/DECOMPRESSION MICRODISCECTOMY LUMBAR THREE-FOUR, FOUR-FIVE, LEFT FIVE-SACRAL ONE ;  Surgeon: Charlie Pitter, MD;  Location: [facility];  Service: Neurosurgery;  Laterality: Left;  LUMBAR LAMINECTOMY/DECOMPRESSION MICRODISCECTOMY LUMBAR THREE-FOUR, FOUR-FIVE, LEFT FIVE-SACRAL ONE    LUMBAR LAMINECTOMY/DECOMPRESSION MICRODISCECTOMY N/A 05/03/2021   Procedure: Laminectomy and Foraminotomy - L2-L3;  Surgeon: Earnie Larsson, MD;  Location: [facility];  Service: Neurosurgery;  Laterality: N/A;  3C   POSTERIOR CERVICAL FUSION/FORAMINOTOMY N/A 08/07/2020   Procedure: C3-6 POSTERIOR FUSION WITH DECOMPRESSION;  Surgeon: Meade Maw, MD;  Location: [facility];  Service: Neurosurgery;  Laterality: N/A;   PROSTATECTOMY  8/14   [facility] Dr Mare Ferrari    Patient Active  Problem List   Diagnosis Date Noted   SVT (supraventricular tachycardia) ([place]) 12/30/2021   Hypothyroidism 12/30/2021   Parkinson's disease ([place]) 12/30/2021   Elevated troponin 12/30/2021   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   Acquired thrombophilia ([place]) 01/13/2021   History of decompression of median nerve 01/01/2021   Paroxysmal atrial fibrillation ([place]) 10/06/2020   Carotid stenosis, right 09/17/2020   S/P cervical spinal fusion    Leukocytosis    Essential hypertension    Anemia of chronic disease    Postoperative pain    Neuropathic pain    Cervical myelopathy ([place]) 08/07/2020   Preop cardiovascular exam 07/17/2020   SOB (shortness of breath) on exertion 07/17/2020   Leg weakness, bilateral 07/13/2020   Aortic atherosclerosis ([place]) 07/09/2020   Body mass index (BMI) 34.0-34.9, adult 12/25/2019   Myalgia 10/30/2019   Lumbar post-laminectomy syndrome 10/24/2019   PAD (peripheral artery disease) ([place]) 06/06/2019   CKD (chronic kidney disease) stage 3, GFR 30-59 ml/min (HCC) 04/29/2019   B12 deficiency 01/23/2019   Left arm numbness 02/28/2018   Neck pain 02/28/2018   Anemia 02/02/2017   DDD (degenerative disc disease), cervical 02/02/2017   Hypothyroid 02/02/2017   MRSA (methicillin resistant staph aureus) culture positive 02/02/2017   Nocturnal hypoxia 02/02/2017   Senile purpura ([place]) 10/03/2016   Essential hypertension, benign 09/16/2016   Bilateral carotid artery disease ([place]) 09/16/2016   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/18/2016   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   History of prostate cancer 12/21/2015   Left carpal tunnel syndrome 11/11/2015   Kidney stone on left side 07/05/2015   Myasthenia gravis ([place]) 05/26/2015   Radiculitis 01/05/2015   Long-term use of high-risk medication 10/15/2014   Persistent cough 09/10/2014   Pure hypercholesterolemia 07/25/2014   Spinal stenosis, lumbar region, with neurogenic claudication 02/24/2014    Lumbosacral stenosis with neurogenic claudication 02/24/2014   COPD (chronic obstructive pulmonary disease) ([place]) 01/22/2014    REFERRING DIAG: balance disorder   THERAPY DIAG:  Difficulty in walking, not elsewhere classified  Other abnormalities of gait and mobility  Muscle weakness (generalized)  Other lack of coordination  Repeated falls  Unsteadiness on feet  Chronic bilateral low back pain, unspecified whether sciatica present  Cervical myelopathy (HCC)  Abnormality of gait and mobility  Rationale for Evaluation and Treatment Rehabilitation  PERTINENT HISTORY: Francisco [name] is a 76 year old male referred to [facility] neuro for difficulty with balance. He was also just recently dx in May 2023 with Impingement syndrome of left shoulder (M75.42) Impingement syndrome of left shoulder (primary encounter diagnosis) Left shoulder pain, unspecified chronicity Nontraumatic rupture of left proximal biceps tendon Past medical hx includes Lumbar laminectomy 04/2021. C3-6 decompression, fusion on 11/26, myasthenia gravis, 2012 ACDF 5/6, 6/7; OSA, hypothyroidism, HTN, COPD, multiple back surgeries, 2021 carpal tunnel release.  PRECAUTIONS: Fall  SUBJECTIVE: Patient reports having a really weak day today. States he will do  what he can.    PAIN:  Are you having pain? No     TODAY'S TREATMENT:   Therex:     Seated hip march 10 x each LE with no Weight x 2 sets Seated knee ext  with no resistance Artist held out a 1/2 foam roll for feedback on height of kick) - 2 sets x 10 rep Seated hip add with 5 sec ball squeeze x 10 reps x 2 sets Seated - start in lumbar flexed position holding a 2kg ball toward floor- raise arms/ball up overhead for lumar/thoracic ext with shoulder flex) x 10 reps Amb overground with SBQC, CGA-  x 2 trials: 1) 17 feet (stopped due to fatigue) 2) 45 feet  *2 min rest break  *Patient requested to stop due to undo fatigue.       Pt required occasional rest  breaks due fatigue, PT was quick to ask when pt appeared to be fatiguing in order to prevent excessive fatigue. Pt educated throughout session about proper posture and technique with exercises. Improved exercise technique, movement at target joints, use of target muscles after min to mod verbal, visual, tactile cues.   PATIENT EDUCATION: Education details: Exercise technique Person educated: Patient Education method: Explanation, Demonstration, Tactile cues, Verbal cues, and Handouts Education comprehension: verbalized understanding, returned demonstration, verbal cues required, and tactile cues required   HOME EXERCISE PROGRAM:  No Updates today  Access Code: [email or web address] URL: [email or web address] Date: 03/01/2022 Prepared by: Janna Arch  Exercises - Seated March  - 1 x daily - 7 x weekly - 2 sets - 10 reps - 5 hold - Seated Long Arc Quad  - 1 x daily - 7 x weekly - 2 sets - 10 reps - 5 hold - Seated Hip Abduction  - 1 x daily - 7 x weekly - 2 sets - 10 reps - 5 hold - Seated Hip Adduction Isometrics with Ball  - 1 x daily - 7 x weekly - 2 sets - 10 reps - 5 hold - Seated Gluteal Sets  - 1 x daily - 7 x weekly - 2 sets - 10 reps - 5 hold - Seated Heel Toe Raises  - 1 x daily - 7 x weekly - 2 sets - 10 reps - 5 hold LE strength and balance         PT Short Term Goals       PT SHORT TERM GOAL #1   Title Pt will be independent with initial HEP in order to improve strength and balance in order to decrease fall risk and improve function at home and work.    Baseline 02/23/2022- Patient reports not doing much as far as exercise in the home currently. 04/20/2022- Patient reports compliant with HEP and no questions at this time.    Time 6    Period Weeks    Status Goal met   Target Date 04/06/22              PT Long Term Goals       PT LONG TERM GOAL #1   Title Pt will be independent with final HEP in order to improve strength and balance in order to decrease  fall risk and improve function at home and work. 04/20/2022- Patient reports compliant with HEP and no questions at this time.    Baseline 02/23/2022= No formal HEP in place.    Time 12    Period Weeks    Status Ongoing   Target Date  05/18/22      PT LONG TERM GOAL #2   Title Pt will decrease 5TSTS by at least 8 seconds in order to demonstrate clinically significant improvement in LE strength.    Baseline 02/23/2022= 31.52 sec with B hands on knees; 04/20/2022= 16.08 sec with B hands on knees   Time 12    Period Days    Status Goal Met   Target Date 05/18/22      PT LONG TERM GOAL #3   Title Pt will increase 10MWT by at least 0.23 m/s in order to demonstrate clinically significant improvement in community ambulation.    Baseline 02/23/2022= 0.39 m/s using RW; 0.45 m/s    Time 12    Period Weeks    Status  Improving/Ongoing   Target Date 05/18/22      PT LONG TERM GOAL #4   Title Pt will improve FOTO to target score of 50 to display perceived improvements in ability to complete ADL's.    Baseline 02/23/2022= 47    Time 12    Period Weeks    Status New    Target Date 05/18/22      PT LONG TERM GOAL #5   Title Pt will decrease TUG to below 20 seconds/decrease in order to demonstrate decreased fall risk.    Baseline 02/23/2022=28.27 sec with RW; 04/20/2022= 20.22 with SBQC   Time 12    Period Weeks    Status Ongoing   Target Date 05/18/22              Plan -     Clinical Impression Statement Patient remains highly motivated but much more limited- Unable to walk much and decreased resistance performed today. Depsite these modifications- patient still required termination of visit due to fatigue today. Will need to reassess all goals for recert next visit. Treatment modified to more seated therex to accommodate fatigue level.  Patient will benefit from skilled PT services to address his LE muscle weakness and imbalance for improved overall functional mobility, improved balance, and  decrease risk for future falls.     Personal Factors and Comorbidities Age;Past/Current Experience;Time since onset of injury/illness/exacerbation;Comorbidity 3+    Comorbidities COPD, History of Cancer, myasthenia gravis, chronic lumbar surgical history    Examination-Activity Limitations Lift;Squat;Bend;Stairs;Stand;Transfers;Insurance claims handler;Bathing;Hygiene/Grooming;Dressing;Toileting;Bed Mobility;Caring for Others;Continence    Examination-Participation Restrictions Yard Work;Church;Cleaning;Driving;Community Activity    Stability/Clinical Decision Making Evolving/Moderate complexity    Rehab Potential Good    Clinical Impairments Affecting Rehab Potential multipe comorbidities    PT Frequency 2x / week    PT Duration 12 weeks    PT Treatment/Interventions ADLs/Self Care Home Management;Electrical Stimulation;Moist Heat;Traction;Ultrasound;Gait training;Stair training;Functional mobility training;Therapeutic activities;Therapeutic exercise;Balance training;Neuromuscular re-education;Patient/family education;Manual techniques;Passive range of motion;Dry needling;Joint Manipulations;Cryotherapy;Vestibular;Canalith Repostioning    PT Next Visit Plan standing strengthening and balance, seated strengthening and coordination    PT Home Exercise Plan Progress LE strengthen and functional endurance   Consulted and Agree with Plan of Care Patient            Lewis Moccasin PT   05/18/2022, 1:57 PM

## 2022-05-19 ENCOUNTER — Ambulatory Visit: Payer: Medicare Other

## 2022-05-19 DIAGNOSIS — R262 Difficulty in walking, not elsewhere classified: Secondary | ICD-10-CM

## 2022-05-19 DIAGNOSIS — M6281 Muscle weakness (generalized): Secondary | ICD-10-CM

## 2022-05-19 DIAGNOSIS — R2681 Unsteadiness on feet: Secondary | ICD-10-CM

## 2022-05-19 NOTE — Therapy (Signed)
OUTPATIENT PHYSICAL THERAPY TREATMENT NOTE/RECERT   Patient Name: Francisco [name] MRN: [id number] DOB:04/28/1946, 76 y.o., male Today's Date: 05/19/2022  PCP: Adin Hector, MD REFERRING PROVIDER: Meade Maw MD    PT End of Session - 05/19/22 1508     Visit Number 18    Number of Visits 66    Date for PT Re-Evaluation 08/11/22    Authorization Time Period 02/23/2022-05/18/2022    Progress Note Due on Visit 61    PT Start Time 1508    PT Stop Time 1550    PT Time Calculation (min) 42 min    Equipment Utilized During Treatment Gait belt    Activity Tolerance Patient tolerated treatment well    Behavior During Therapy WFL for tasks assessed/performed                      Past Medical History:  Diagnosis Date   Arthritis    lower left hip   Atypical angina (HCC)    Bilateral hand numbness    from back surgery   Bronchitis, chronic (HCC)    Cancer ([place])    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; Merkel cell cancer, and Basal cell cancer (twice; 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   Carotid stenosis    CHF (congestive heart failure) (HCC)    CKD (chronic kidney disease)    CKD (chronic kidney disease) stage 3, GFR 30-59 ml/min (HCC)    COPD (chronic obstructive pulmonary disease) ([place])    stage 2   DDD (degenerative disc disease), cervical    Dysrhythmia    post carotid stent bradycardia; PAF 09/2020   GERD (gastroesophageal reflux disease)    Hypercholesterolemia    Hypertension    Hypothyroidism    pt takes Levothyroxine daily   Lumbosacral spinal stenosis    Myasthenia gravis, adult form ([place])    PAD (peripheral artery disease) ([place])    Shortness of breath    Lung MD- Dr Darlin Coco   Sleep apnea    do not use CPAP every night   Past Surgical History:  Procedure Laterality Date   ANTERIOR CERVICAL DECOMP/DISCECTOMY FUSION  07/18/2011   Procedure: ANTERIOR CERVICAL DECOMPRESSION/DISCECTOMY FUSION 2 LEVELS;  Surgeon: Cooper Render Pool;  Location: [facility];  Service:  Neurosurgery;  Laterality: N/A;  cervical five-six, cervical six-seven anterior cervical discectomy and fusion   BACK SURGERY     in [place]     01/2020 Right, 04/2020 Left   CARDIAC CATHETERIZATION     2005 at [facility], no stents   CAROTID PTA/STENT INTERVENTION N/A 09/17/2020   Procedure: CAROTID PTA/STENT INTERVENTION;  Surgeon: Algernon Huxley, MD;  Location: [facility];  Service: Cardiovascular;  Laterality: N/A;   CATARACT EXTRACTION W/PHACO Left 01/06/2020   Procedure: CATARACT EXTRACTION PHACO AND INTRAOCULAR LENS PLACEMENT (IOC) ISTENT INJ LEFT 3.81  00:33.3;  Surgeon: Eulogio Bear, MD;  Location: [facility];  Service: Ophthalmology;  Laterality: Left;   CATARACT EXTRACTION W/PHACO Right 02/03/2020   Procedure: CATARACT EXTRACTION PHACO AND INTRAOCULAR LENS PLACEMENT ([place]) RIGHT ISTENT INJ;  Surgeon: Eulogio Bear, MD;  Location: [facility];  Service: Ophthalmology;  Laterality: Right;  4.29 0:35.6   COLONOSCOPY     HERNIA REPAIR Left    inguinal hernia repair in [place] MICRODISCECTOMY Left 02/24/2014   Procedure: LUMBAR LAMINECTOMY/DECOMPRESSION MICRODISCECTOMY LUMBAR THREE-FOUR, FOUR-FIVE, LEFT FIVE-SACRAL ONE ;  Surgeon: Charlie Pitter,  MD;  Location: [facility];  Service: Neurosurgery;  Laterality: Left;  LUMBAR LAMINECTOMY/DECOMPRESSION MICRODISCECTOMY LUMBAR THREE-FOUR, FOUR-FIVE, LEFT FIVE-SACRAL ONE    LUMBAR LAMINECTOMY/DECOMPRESSION MICRODISCECTOMY N/A 05/03/2021   Procedure: Laminectomy and Foraminotomy - L2-L3;  Surgeon: Earnie Larsson, MD;  Location: [facility];  Service: Neurosurgery;  Laterality: N/A;  3C   POSTERIOR CERVICAL FUSION/FORAMINOTOMY N/A 08/07/2020   Procedure: C3-6 POSTERIOR FUSION WITH DECOMPRESSION;  Surgeon: Meade Maw, MD;  Location: [facility];  Service: Neurosurgery;  Laterality: N/A;   PROSTATECTOMY  8/14   [facility] Dr Mare Ferrari    Patient Active  Problem List   Diagnosis Date Noted   SVT (supraventricular tachycardia) ([place]) 12/30/2021   Hypothyroidism 12/30/2021   Parkinson's disease ([place]) 12/30/2021   Elevated troponin 12/30/2021   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   Acquired thrombophilia ([place]) 01/13/2021   History of decompression of median nerve 01/01/2021   Paroxysmal atrial fibrillation ([place]) 10/06/2020   Carotid stenosis, right 09/17/2020   S/P cervical spinal fusion    Leukocytosis    Essential hypertension    Anemia of chronic disease    Postoperative pain    Neuropathic pain    Cervical myelopathy ([place]) 08/07/2020   Preop cardiovascular exam 07/17/2020   SOB (shortness of breath) on exertion 07/17/2020   Leg weakness, bilateral 07/13/2020   Aortic atherosclerosis ([place]) 07/09/2020   Body mass index (BMI) 34.0-34.9, adult 12/25/2019   Myalgia 10/30/2019   Lumbar post-laminectomy syndrome 10/24/2019   PAD (peripheral artery disease) ([place]) 06/06/2019   CKD (chronic kidney disease) stage 3, GFR 30-59 ml/min (HCC) 04/29/2019   B12 deficiency 01/23/2019   Left arm numbness 02/28/2018   Neck pain 02/28/2018   Anemia 02/02/2017   DDD (degenerative disc disease), cervical 02/02/2017   Hypothyroid 02/02/2017   MRSA (methicillin resistant staph aureus) culture positive 02/02/2017   Nocturnal hypoxia 02/02/2017   Senile purpura ([place]) 10/03/2016   Essential hypertension, benign 09/16/2016   Bilateral carotid artery disease ([place]) 09/16/2016   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/18/2016   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   History of prostate cancer 12/21/2015   Left carpal tunnel syndrome 11/11/2015   Kidney stone on left side 07/05/2015   Myasthenia gravis ([place]) 05/26/2015   Radiculitis 01/05/2015   Long-term use of high-risk medication 10/15/2014   Persistent cough 09/10/2014   Pure hypercholesterolemia 07/25/2014   Spinal stenosis, lumbar region, with neurogenic claudication 02/24/2014    Lumbosacral stenosis with neurogenic claudication 02/24/2014   COPD (chronic obstructive pulmonary disease) ([place]) 01/22/2014    REFERRING DIAG: balance disorder   THERAPY DIAG:  Difficulty in walking, not elsewhere classified  Muscle weakness (generalized)  Unsteadiness on feet  Rationale for Evaluation and Treatment Rehabilitation  PERTINENT HISTORY: Dontario Evetts is a 76 year old male referred to [facility] neuro for difficulty with balance. He was also just recently dx in May 2023 with Impingement syndrome of left shoulder (M75.42) Impingement syndrome of left shoulder (primary encounter diagnosis) Left shoulder pain, unspecified chronicity Nontraumatic rupture of left proximal biceps tendon Past medical hx includes Lumbar laminectomy 04/2021. C3-6 decompression, fusion on 11/26, myasthenia gravis, 2012 ACDF 5/6, 6/7; OSA, hypothyroidism, HTN, COPD, multiple back surgeries, 2021 carpal tunnel release.  PRECAUTIONS: Fall  SUBJECTIVE: Pt feels better today and has more energy. He reports no aches or pains while sitting down at the moment. When moving thighs ache. Pt reports no stumbles/falls, no other current concerns.  PAIN:  Are you having pain? No     TODAY'S  TREATMENT:   Therex:   Goal retesting completed on this date for recertification. PT instructed pt throughout in technique with testing and indications of test performance on progress and plan. Please see goal section below for details.   Other interventions - Seated hip march 10 x each LE with no Weight x 2 sets Seated knee ext  with no resistance 1 sets x 10 reps each LE  Rest breaks taken throughout    PATIENT EDUCATION: Education details: Goals, plan, exercise technique Person educated: Patient Education method: Explanation, Demonstration, Tactile cues, Verbal cues, and Handouts Education comprehension: verbalized understanding, returned demonstration, verbal cues required, and tactile cues required   HOME  EXERCISE PROGRAM:  No Updates today  Access Code: [email or web address] URL: [email or web address] Date: 03/01/2022 Prepared by: Janna Arch  Exercises - Seated March  - 1 x daily - 7 x weekly - 2 sets - 10 reps - 5 hold - Seated Long Arc Quad  - 1 x daily - 7 x weekly - 2 sets - 10 reps - 5 hold - Seated Hip Abduction  - 1 x daily - 7 x weekly - 2 sets - 10 reps - 5 hold - Seated Hip Adduction Isometrics with Ball  - 1 x daily - 7 x weekly - 2 sets - 10 reps - 5 hold - Seated Gluteal Sets  - 1 x daily - 7 x weekly - 2 sets - 10 reps - 5 hold - Seated Heel Toe Raises  - 1 x daily - 7 x weekly - 2 sets - 10 reps - 5 hold LE strength and balance         PT Short Term Goals       PT SHORT TERM GOAL #1   Title Pt will be independent with initial HEP in order to improve strength and balance in order to decrease fall risk and improve function at home and work.    Baseline 02/23/2022- Patient reports not doing much as far as exercise in the home currently. 04/20/2022- Patient reports compliant with HEP and no questions at this time.; 9/7 pt indep   Time 6    Period Weeks    Status Goal met   Target Date 04/06/22              PT Long Term Goals       PT LONG TERM GOAL #1   Title Pt will be independent with final HEP in order to improve strength and balance in order to decrease fall risk and improve function at home and work. 04/20/2022- Patient reports compliant with HEP and no questions at this time.    Baseline 02/23/2022= No formal HEP in place. 9/7: pt performing at least once a day, feels comfortable   Time 12    Period Weeks    Status GOAL MET   Target Date 08/11/2022      PT LONG TERM GOAL #2   Title Pt will decrease 5TSTS by at least 8 seconds in order to demonstrate clinically significant improvement in LE strength.    Baseline 02/23/2022= 31.52 sec with B hands on knees; 04/20/2022= 16.08 sec with B hands on knees   Time 12    Period Days    Status Goal Met   Target  Date 05/18/22      PT LONG TERM GOAL #3   Title Pt will increase 10MWT by at least 0.23 m/s in order to demonstrate clinically significant improvement in community ambulation.  Baseline 02/23/2022= 0.39 m/s using RW; 0.45 m/s; 9/7: 0.37 m/s with QC, 0.74 m/s with RW   Time 12    Period Weeks    Status GOAL MET   Target Date 05/18/22      PT LONG TERM GOAL #4   Title Pt will improve FOTO to target score of 50 to display perceived improvements in ability to complete ADL's.    Baseline 02/23/2022= 47; 9/7: 49   Time 12    Period Weeks    Status Partially met     Target Date 08/11/2022      PT LONG TERM GOAL #5   Title Pt will decrease TUG to below 20 seconds/decrease in order to demonstrate decreased fall risk.    Baseline 02/23/2022=28.27 sec with RW; 04/20/2022= 20.22 with SBQC; 9/7: 14.4 sec with RW, 18.5 sec with SBQC   Time 12    Period Weeks    Status MET   Target Date 05/18/22    PT LONG TERM GOAL #6  Title Pt will decrease TUG to 14 sec or below with SBQC in order to demonstrate decreased fall risk.   Baseline  9/7: 14.4 sec with RW, 18.5 sec with SBQC  Time 12   Period Weeks   Status NEW  Target Date 08/11/2022   PT LONG TERM GOAL #7  Title Patient will increase six minute walk test distance to >700 ft for improved gait ability and increased ease with community participation.  Baseline 9/7: completes 4 minutes and 444 ft with RW. Test discontinued at 4 minutes due to [place].   Time 12   Period Weeks   Status NEW  Target Date 08/11/2022              Plan -     Clinical Impression Statement Goals retested for recert. Pt making gains AEB meeting TUG and 10MWT goals, indicating decrease in fall risk and improved gait speed. Pt has also partially met FOTO goal, indicating improved perceived functional mobility and QOL. While pt shows progress, he still is at risk for future falls. PT has added new TUG goal to further reduce fall risk and 6MWT goal to improve gait  ability and endurance. Patient will benefit from skilled PT services to address his LE muscle weakness and imbalance for improved overall functional mobility, improved balance, and decrease risk for future falls.     Personal Factors and Comorbidities Age;Past/Current Experience;Time since onset of injury/illness/exacerbation;Comorbidity 3+    Comorbidities COPD, History of Cancer, myasthenia gravis, chronic lumbar surgical history    Examination-Activity Limitations Lift;Squat;Bend;Stairs;Stand;Transfers;Insurance claims handler;Bathing;Hygiene/Grooming;Dressing;Toileting;Bed Mobility;Caring for Others;Continence    Examination-Participation Restrictions Yard Work;Church;Cleaning;Driving;Community Activity    Stability/Clinical Decision Making Evolving/Moderate complexity    Rehab Potential Good    Clinical Impairments Affecting Rehab Potential multipe comorbidities    PT Frequency 2x / week    PT Duration 12 weeks    PT Treatment/Interventions ADLs/Self Care Home Management;Electrical Stimulation;Moist Heat;Traction;Ultrasound;Gait training;Stair training;Functional mobility training;Therapeutic activities;Therapeutic exercise;Balance training;Neuromuscular re-education;Patient/family education;Manual techniques;Passive range of motion;Dry needling;Joint Manipulations;Cryotherapy;Vestibular;Canalith Repostioning    PT Next Visit Plan standing strengthening and balance, seated strengthening and coordination    PT Home Exercise Plan Progress LE strengthen and functional endurance, continue plan   Consulted and Agree with Plan of Care Patient            Zollie Pee PT   05/19/2022, 4:06 PM

## 2022-05-23 ENCOUNTER — Ambulatory Visit: Payer: Medicare Other

## 2022-05-23 DIAGNOSIS — M545 Low back pain, unspecified: Secondary | ICD-10-CM

## 2022-05-23 DIAGNOSIS — R278 Other lack of coordination: Secondary | ICD-10-CM

## 2022-05-23 DIAGNOSIS — R262 Difficulty in walking, not elsewhere classified: Secondary | ICD-10-CM | POA: Diagnosis not present

## 2022-05-23 DIAGNOSIS — R2689 Other abnormalities of gait and mobility: Secondary | ICD-10-CM

## 2022-05-23 DIAGNOSIS — R296 Repeated falls: Secondary | ICD-10-CM

## 2022-05-23 DIAGNOSIS — G8929 Other chronic pain: Secondary | ICD-10-CM

## 2022-05-23 DIAGNOSIS — G959 Disease of spinal cord, unspecified: Secondary | ICD-10-CM

## 2022-05-23 DIAGNOSIS — R2681 Unsteadiness on feet: Secondary | ICD-10-CM

## 2022-05-23 DIAGNOSIS — M6281 Muscle weakness (generalized): Secondary | ICD-10-CM

## 2022-05-23 DIAGNOSIS — R269 Unspecified abnormalities of gait and mobility: Secondary | ICD-10-CM

## 2022-05-23 NOTE — Therapy (Signed)
OUTPATIENT PHYSICAL THERAPY TREATMENT NOTE  Patient Name: Francisco [name] MRN: [id number] DOB:12-13-1945, 76 y.o., male Today's Date: 05/24/2022  PCP: Adin Hector, MD REFERRING PROVIDER: Meade Maw MD    PT End of Session - 05/23/22 1524     Visit Number 19    Number of Visits 32    Date for PT Re-Evaluation 08/11/22    Authorization Time Period 02/23/2022-05/18/2022    Progress Note Due on Visit 82    PT Start Time 1518    PT Stop Time 1600    PT Time Calculation (min) 42 min    Equipment Utilized During Treatment Gait belt    Activity Tolerance Patient tolerated treatment well    Behavior During Therapy WFL for tasks assessed/performed                       Past Medical History:  Diagnosis Date   Arthritis    lower left hip   Atypical angina (HCC)    Bilateral hand numbness    from back surgery   Bronchitis, chronic (HCC)    Cancer ([place])    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; Merkel cell cancer, and Basal cell cancer (twice; 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   Carotid stenosis    CHF (congestive heart failure) (HCC)    CKD (chronic kidney disease)    CKD (chronic kidney disease) stage 3, GFR 30-59 ml/min (HCC)    COPD (chronic obstructive pulmonary disease) ([place])    stage 2   DDD (degenerative disc disease), cervical    Dysrhythmia    post carotid stent bradycardia; PAF 09/2020   GERD (gastroesophageal reflux disease)    Hypercholesterolemia    Hypertension    Hypothyroidism    pt takes Levothyroxine daily   Lumbosacral spinal stenosis    Myasthenia gravis, adult form ([place])    PAD (peripheral artery disease) ([place])    Shortness of breath    Lung MD- Dr Darlin Coco   Sleep apnea    do not use CPAP every night   Past Surgical History:  Procedure Laterality Date   ANTERIOR CERVICAL DECOMP/DISCECTOMY FUSION  07/18/2011   Procedure: ANTERIOR CERVICAL DECOMPRESSION/DISCECTOMY FUSION 2 LEVELS;  Surgeon: Cooper Render Pool;  Location: [place] [facility];  Service:  Neurosurgery;  Laterality: N/A;  cervical five-six, cervical six-seven anterior cervical discectomy and fusion   BACK SURGERY     in [place]     01/2020 Right, 04/2020 Left   CARDIAC CATHETERIZATION     2005 [facility], no stents   CAROTID PTA/STENT INTERVENTION N/A 09/17/2020   Procedure: CAROTID PTA/STENT INTERVENTION;  Surgeon: Algernon Huxley, MD;  Location: [place] [facility];  Service: Cardiovascular;  Laterality: N/A;   CATARACT EXTRACTION W/PHACO Left 01/06/2020   Procedure: CATARACT EXTRACTION PHACO AND INTRAOCULAR LENS PLACEMENT (IOC) ISTENT INJ LEFT 3.81  00:33.3;  Surgeon: Eulogio Bear, MD;  Location: [place];  Service: Ophthalmology;  Laterality: Left;   CATARACT EXTRACTION W/PHACO Right 02/03/2020   Procedure: CATARACT EXTRACTION PHACO AND INTRAOCULAR LENS PLACEMENT ([place]) RIGHT ISTENT INJ;  Surgeon: Eulogio Bear, MD;  Location: [place];  Service: Ophthalmology;  Laterality: Right;  4.29 0:35.6   COLONOSCOPY     HERNIA REPAIR Left    inguinal hernia repair in [place] MICRODISCECTOMY Left 02/24/2014   Procedure: LUMBAR LAMINECTOMY/DECOMPRESSION MICRODISCECTOMY LUMBAR THREE-FOUR, FOUR-FIVE, LEFT FIVE-SACRAL ONE ;  Surgeon: Charlie Pitter,  MD;  Location: [place] [facility];  Service: Neurosurgery;  Laterality: Left;  LUMBAR LAMINECTOMY/DECOMPRESSION MICRODISCECTOMY LUMBAR THREE-FOUR, FOUR-FIVE, LEFT FIVE-SACRAL ONE    LUMBAR LAMINECTOMY/DECOMPRESSION MICRODISCECTOMY N/A 05/03/2021   Procedure: Laminectomy and Foraminotomy - L2-L3;  Surgeon: Earnie Larsson, MD;  Location: [place];  Service: Neurosurgery;  Laterality: N/A;  3C   POSTERIOR CERVICAL FUSION/FORAMINOTOMY N/A 08/07/2020   Procedure: C3-6 POSTERIOR FUSION WITH DECOMPRESSION;  Surgeon: Meade Maw, MD;  Location: [facility];  Service: Neurosurgery;  Laterality: N/A;   PROSTATECTOMY  8/14   [facility] Dr Mare Ferrari    Patient Active  Problem List   Diagnosis Date Noted   SVT (supraventricular tachycardia) ([place]) 12/30/2021   Hypothyroidism 12/30/2021   Parkinson's disease ([place]) 12/30/2021   Elevated troponin 12/30/2021   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   Acquired thrombophilia ([place]) 01/13/2021   History of decompression of median nerve 01/01/2021   Paroxysmal atrial fibrillation ([place]) 10/06/2020   Carotid stenosis, right 09/17/2020   S/P cervical spinal fusion    Leukocytosis    Essential hypertension    Anemia of chronic disease    Postoperative pain    Neuropathic pain    Cervical myelopathy ([place]) 08/07/2020   Preop cardiovascular exam 07/17/2020   SOB (shortness of breath) on exertion 07/17/2020   Leg weakness, bilateral 07/13/2020   Aortic atherosclerosis ([place]) 07/09/2020   Body mass index (BMI) 34.0-34.9, adult 12/25/2019   Myalgia 10/30/2019   Lumbar post-laminectomy syndrome 10/24/2019   PAD (peripheral artery disease) ([place]) 06/06/2019   CKD (chronic kidney disease) stage 3, GFR 30-59 ml/min (HCC) 04/29/2019   B12 deficiency 01/23/2019   Left arm numbness 02/28/2018   Neck pain 02/28/2018   Anemia 02/02/2017   DDD (degenerative disc disease), cervical 02/02/2017   Hypothyroid 02/02/2017   MRSA (methicillin resistant staph aureus) culture positive 02/02/2017   Nocturnal hypoxia 02/02/2017   Senile purpura ([place]) 10/03/2016   Essential hypertension, benign 09/16/2016   Bilateral carotid artery disease ([place]) 09/16/2016   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/18/2016   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   History of prostate cancer 12/21/2015   Left carpal tunnel syndrome 11/11/2015   Kidney stone on left side 07/05/2015   Myasthenia gravis ([place]) 05/26/2015   Radiculitis 01/05/2015   Long-term use of high-risk medication 10/15/2014   Persistent cough 09/10/2014   Pure hypercholesterolemia 07/25/2014   Spinal stenosis, lumbar region, with neurogenic claudication 02/24/2014    Lumbosacral stenosis with neurogenic claudication 02/24/2014   COPD (chronic obstructive pulmonary disease) ([place]) 01/22/2014    REFERRING DIAG: balance disorder   THERAPY DIAG:  Difficulty in walking, not elsewhere classified  Muscle weakness (generalized)  Unsteadiness on feet  Other abnormalities of gait and mobility  Other lack of coordination  Repeated falls  Chronic bilateral low back pain, unspecified whether sciatica present  Cervical myelopathy (HCC)  Abnormality of gait and mobility  Rationale for Evaluation and Treatment Rehabilitation  PERTINENT HISTORY: Sergio Zawislak is a 76 year old male referred to [facility] neuro for difficulty with balance. He was also just recently dx in May 2023 with Impingement syndrome of left shoulder (M75.42) Impingement syndrome of left shoulder (primary encounter diagnosis) Left shoulder pain, unspecified chronicity Nontraumatic rupture of left proximal biceps tendon Past medical hx includes Lumbar laminectomy 04/2021. C3-6 decompression, fusion on 11/26, myasthenia gravis, 2012 ACDF 5/6, 6/7; OSA, hypothyroidism, HTN, COPD, multiple back surgeries, 2021 carpal tunnel release.  PRECAUTIONS: Fall  SUBJECTIVE: Patient reports feeling okay today and not as week as  last week.    PAIN:  Are you having pain? No     TODAY'S TREATMENT:   Therex:   Seated hip march onto 6" block 15 x each LE with no Weight x 2 sets Seated knee ext  with no resistance 2 sets x 10 reps each LE Seated hip flex/abd up and over 1/2 foam x 12 reps each LE Seated calf raises 2 sets of 12 reps Seated hamstring stretch x 30 sec x 4 Seated figure 4 stretch x 30 sec x 4  Standing hip flex stretch- hold 30 sec x 2 each Leg  Rest breaks taken throughout    PATIENT EDUCATION: Education details: Goals, plan, exercise technique Person educated: Patient Education method: Explanation, Demonstration, Tactile cues, Verbal cues, and Handouts Education  comprehension: verbalized understanding, returned demonstration, verbal cues required, and tactile cues required   HOME EXERCISE PROGRAM:  No Updates today  Access Code: [email or web address] URL: [email or web address] Date: 03/01/2022 Prepared by: Janna Arch  Exercises - Seated March  - 1 x daily - 7 x weekly - 2 sets - 10 reps - 5 hold - Seated Long Arc Quad  - 1 x daily - 7 x weekly - 2 sets - 10 reps - 5 hold - Seated Hip Abduction  - 1 x daily - 7 x weekly - 2 sets - 10 reps - 5 hold - Seated Hip Adduction Isometrics with Ball  - 1 x daily - 7 x weekly - 2 sets - 10 reps - 5 hold - Seated Gluteal Sets  - 1 x daily - 7 x weekly - 2 sets - 10 reps - 5 hold - Seated Heel Toe Raises  - 1 x daily - 7 x weekly - 2 sets - 10 reps - 5 hold LE strength and balance         PT Short Term Goals       PT SHORT TERM GOAL #1   Title Pt will be independent with initial HEP in order to improve strength and balance in order to decrease fall risk and improve function at home and work.    Baseline 02/23/2022- Patient reports not doing much as far as exercise in the home currently. 04/20/2022- Patient reports compliant with HEP and no questions at this time.; 9/7 pt indep   Time 6    Period Weeks    Status Goal met   Target Date 04/06/22              PT Long Term Goals       PT LONG TERM GOAL #1   Title Pt will be independent with final HEP in order to improve strength and balance in order to decrease fall risk and improve function at home and work. 04/20/2022- Patient reports compliant with HEP and no questions at this time.    Baseline 02/23/2022= No formal HEP in place. 9/7: pt performing at least once a day, feels comfortable   Time 12    Period Weeks    Status GOAL MET   Target Date 08/11/2022      PT LONG TERM GOAL #2   Title Pt will decrease 5TSTS by at least 8 seconds in order to demonstrate clinically significant improvement in LE strength.    Baseline 02/23/2022= 31.52  sec with B hands on knees; 04/20/2022= 16.08 sec with B hands on knees   Time 12    Period Days    Status Goal Met   Target Date 05/18/22  PT LONG TERM GOAL #3   Title Pt will increase 10MWT by at least 0.23 m/s in order to demonstrate clinically significant improvement in community ambulation.    Baseline 02/23/2022= 0.39 m/s using RW; 0.45 m/s; 9/7: 0.37 m/s with QC, 0.74 m/s with RW   Time 12    Period Weeks    Status GOAL MET   Target Date 05/18/22      PT LONG TERM GOAL #4   Title Pt will improve FOTO to target score of 50 to display perceived improvements in ability to complete ADL's.    Baseline 02/23/2022= 47; 9/7: 49   Time 12    Period Weeks    Status Partially met     Target Date 08/11/2022      PT LONG TERM GOAL #5   Title Pt will decrease TUG to below 20 seconds/decrease in order to demonstrate decreased fall risk.    Baseline 02/23/2022=28.27 sec with RW; 04/20/2022= 20.22 with SBQC; 9/7: 14.4 sec with RW, 18.5 sec with SBQC   Time 12    Period Weeks    Status MET   Target Date 05/18/22    PT LONG TERM GOAL #6  Title Pt will decrease TUG to 14 sec or below with SBQC in order to demonstrate decreased fall risk.   Baseline  9/7: 14.4 sec with RW, 18.5 sec with SBQC  Time 12   Period Weeks   Status NEW  Target Date 08/11/2022   PT LONG TERM GOAL #7  Title Patient will increase six minute walk test distance to >700 ft for improved gait ability and increased ease with community participation.  Baseline 9/7: completes 4 minutes and 444 ft with RW. Test discontinued at 4 minutes due to [place].   Time 12   Period Weeks   Status NEW  Target Date 08/11/2022              Plan -     Clinical Impression Statement Patient performed mostly seated LE strengthening and did well without complaint of pain- only fatigue. Patient was instructed in seated LE stretching to assist with tightness and patient demo increased difficulty with figure 4 on left but did improve with  practice.  Patient will benefit from skilled PT services to address his LE muscle weakness and imbalance for improved overall functional mobility, improved balance, and decrease risk for future falls.     Personal Factors and Comorbidities Age;Past/Current Experience;Time since onset of injury/illness/exacerbation;Comorbidity 3+    Comorbidities COPD, History of Cancer, myasthenia gravis, chronic lumbar surgical history    Examination-Activity Limitations Lift;Squat;Bend;Stairs;Stand;Transfers;Insurance claims handler;Bathing;Hygiene/Grooming;Dressing;Toileting;Bed Mobility;Caring for Others;Continence    Examination-Participation Restrictions Yard Work;Church;Cleaning;Driving;Community Activity    Stability/Clinical Decision Making Evolving/Moderate complexity    Rehab Potential Good    Clinical Impairments Affecting Rehab Potential multipe comorbidities    PT Frequency 2x / week    PT Duration 12 weeks    PT Treatment/Interventions ADLs/Self Care Home Management;Electrical Stimulation;Moist Heat;Traction;Ultrasound;Gait training;Stair training;Functional mobility training;Therapeutic activities;Therapeutic exercise;Balance training;Neuromuscular re-education;Patient/family education;Manual techniques;Passive range of motion;Dry needling;Joint Manipulations;Cryotherapy;Vestibular;Canalith Repostioning    PT Next Visit Plan standing strengthening and balance, seated strengthening and coordination    PT Home Exercise Plan Progress LE strengthen and functional endurance, continue plan   Consulted and Agree with Plan of Care Patient            Lewis Moccasin PT   05/24/2022, 8:40 AM

## 2022-05-26 ENCOUNTER — Ambulatory Visit: Payer: Medicare Other

## 2022-05-26 DIAGNOSIS — R262 Difficulty in walking, not elsewhere classified: Secondary | ICD-10-CM

## 2022-05-26 DIAGNOSIS — R2689 Other abnormalities of gait and mobility: Secondary | ICD-10-CM

## 2022-05-26 DIAGNOSIS — M6281 Muscle weakness (generalized): Secondary | ICD-10-CM

## 2022-05-26 DIAGNOSIS — R2681 Unsteadiness on feet: Secondary | ICD-10-CM

## 2022-05-26 NOTE — Therapy (Signed)
OUTPATIENT PHYSICAL THERAPY TREATMENT NOTE/PHYSICAL THERAPY PROGRESS NOTE   Dates of reporting period  05/18/25   to   08/11/22   Patient Name: Francisco [name] MRN: [id number] DOB:1946/02/13, 76 y.o., male Today's Date: 05/26/2022  PCP: Adin Hector, MD REFERRING PROVIDER: Meade Maw MD    PT End of Session - 05/26/22 1440     Visit Number 20    Number of Visits 36    Date for PT Re-Evaluation 08/11/22    Authorization Time Period 02/23/2022-05/18/2022    Progress Note Due on Visit 12    PT Start Time 1438    PT Stop Time 1524    PT Time Calculation (min) 46 min    Equipment Utilized During Treatment Gait belt    Activity Tolerance Patient tolerated treatment well    Behavior During Therapy WFL for tasks assessed/performed                       Past Medical History:  Diagnosis Date   Arthritis    lower left hip   Atypical angina (HCC)    Bilateral hand numbness    from back surgery   Bronchitis, chronic (HCC)    Cancer ([place])    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; Merkel cell cancer, and Basal cell cancer (twice; 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   Carotid stenosis    CHF (congestive heart failure) (HCC)    CKD (chronic kidney disease)    CKD (chronic kidney disease) stage 3, GFR 30-59 ml/min (HCC)    COPD (chronic obstructive pulmonary disease) ([place])    stage 2   DDD (degenerative disc disease), cervical    Dysrhythmia    post carotid stent bradycardia; PAF 09/2020   GERD (gastroesophageal reflux disease)    Hypercholesterolemia    Hypertension    Hypothyroidism    pt takes Levothyroxine daily   Lumbosacral spinal stenosis    Myasthenia gravis, adult form ([place])    PAD (peripheral artery disease) ([place])    Shortness of breath    Lung MD- Dr Darlin Coco   Sleep apnea    do not use CPAP every night   Past Surgical History:  Procedure Laterality Date   ANTERIOR CERVICAL DECOMP/DISCECTOMY FUSION  07/18/2011   Procedure: ANTERIOR CERVICAL  DECOMPRESSION/DISCECTOMY FUSION 2 LEVELS;  Surgeon: Cooper Render Pool;  Location: [facility];  Service: Neurosurgery;  Laterality: N/A;  cervical five-six, cervical six-seven anterior cervical discectomy and fusion   BACK SURGERY     in [place]     01/2020 Right, 04/2020 Left   CARDIAC CATHETERIZATION     2005 at [facility], no stents   CAROTID PTA/STENT INTERVENTION N/A 09/17/2020   Procedure: CAROTID PTA/STENT INTERVENTION;  Surgeon: Algernon Huxley, MD;  Location: [place] CV LAB;  Service: Cardiovascular;  Laterality: N/A;   CATARACT EXTRACTION W/PHACO Left 01/06/2020   Procedure: CATARACT EXTRACTION PHACO AND INTRAOCULAR LENS PLACEMENT (IOC) ISTENT INJ LEFT 3.81  00:33.3;  Surgeon: Eulogio Bear, MD;  Location: [place];  Service: Ophthalmology;  Laterality: Left;   CATARACT EXTRACTION W/PHACO Right 02/03/2020   Procedure: CATARACT EXTRACTION PHACO AND INTRAOCULAR LENS PLACEMENT ([place]) RIGHT ISTENT INJ;  Surgeon: Eulogio Bear, MD;  Location: [place];  Service: Ophthalmology;  Laterality: Right;  4.29 0:35.6   COLONOSCOPY     HERNIA REPAIR Left    inguinal hernia repair in [place] MICRODISCECTOMY Left 02/24/2014  Procedure: LUMBAR LAMINECTOMY/DECOMPRESSION MICRODISCECTOMY LUMBAR THREE-FOUR, FOUR-FIVE, LEFT FIVE-SACRAL ONE ;  Surgeon: Charlie Pitter, MD;  Location: [facility];  Service: Neurosurgery;  Laterality: Left;  LUMBAR LAMINECTOMY/DECOMPRESSION MICRODISCECTOMY LUMBAR THREE-FOUR, FOUR-FIVE, LEFT FIVE-SACRAL ONE    LUMBAR LAMINECTOMY/DECOMPRESSION MICRODISCECTOMY N/A 05/03/2021   Procedure: Laminectomy and Foraminotomy - L2-L3;  Surgeon: Earnie Larsson, MD;  Location: [facility];  Service: Neurosurgery;  Laterality: N/A;  3C   POSTERIOR CERVICAL FUSION/FORAMINOTOMY N/A 08/07/2020   Procedure: C3-6 POSTERIOR FUSION WITH DECOMPRESSION;  Surgeon: Meade Maw, MD;  Location: [facility] ORS;  Service:  Neurosurgery;  Laterality: N/A;   PROSTATECTOMY  8/14   [facility] Dr Mare Ferrari    Patient Active Problem List   Diagnosis Date Noted   SVT (supraventricular tachycardia) ([place]) 12/30/2021   Hypothyroidism 12/30/2021   Parkinson's disease ([place]) 12/30/2021   Elevated troponin 12/30/2021   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   Acquired thrombophilia ([place]) 01/13/2021   History of decompression of median nerve 01/01/2021   Paroxysmal atrial fibrillation ([place]) 10/06/2020   Carotid stenosis, right 09/17/2020   S/P cervical spinal fusion    Leukocytosis    Essential hypertension    Anemia of chronic disease    Postoperative pain    Neuropathic pain    Cervical myelopathy ([place]) 08/07/2020   Preop cardiovascular exam 07/17/2020   SOB (shortness of breath) on exertion 07/17/2020   Leg weakness, bilateral 07/13/2020   Aortic atherosclerosis ([place]) 07/09/2020   Body mass index (BMI) 34.0-34.9, adult 12/25/2019   Myalgia 10/30/2019   Lumbar post-laminectomy syndrome 10/24/2019   PAD (peripheral artery disease) ([place]) 06/06/2019   CKD (chronic kidney disease) stage 3, GFR 30-59 ml/min (HCC) 04/29/2019   B12 deficiency 01/23/2019   Left arm numbness 02/28/2018   Neck pain 02/28/2018   Anemia 02/02/2017   DDD (degenerative disc disease), cervical 02/02/2017   Hypothyroid 02/02/2017   MRSA (methicillin resistant staph aureus) culture positive 02/02/2017   Nocturnal hypoxia 02/02/2017   Senile purpura ([place]) 10/03/2016   Essential hypertension, benign 09/16/2016   Bilateral carotid artery disease ([place]) 09/16/2016   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/18/2016   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   History of prostate cancer 12/21/2015   Left carpal tunnel syndrome 11/11/2015   Kidney stone on left side 07/05/2015   Myasthenia gravis ([place]) 05/26/2015   Radiculitis 01/05/2015   Long-term use of high-risk medication 10/15/2014   Persistent cough 09/10/2014   Pure  hypercholesterolemia 07/25/2014   Spinal stenosis, lumbar region, with neurogenic claudication 02/24/2014   Lumbosacral stenosis with neurogenic claudication 02/24/2014   COPD (chronic obstructive pulmonary disease) ([place]) 01/22/2014    REFERRING DIAG: balance disorder   THERAPY DIAG:  Difficulty in walking, not elsewhere classified  Muscle weakness (generalized)  Unsteadiness on feet  Other abnormalities of gait and mobility  Rationale for Evaluation and Treatment Rehabilitation  PERTINENT HISTORY: Aeden Matranga is a 76 year old male referred to [facility] neuro for difficulty with balance. He was also just recently dx in May 2023 with Impingement syndrome of left shoulder (M75.42) Impingement syndrome of left shoulder (primary encounter diagnosis) Left shoulder pain, unspecified chronicity Nontraumatic rupture of left proximal biceps tendon Past medical hx includes Lumbar laminectomy 04/2021. C3-6 decompression, fusion on 11/26, myasthenia gravis, 2012 ACDF 5/6, 6/7; OSA, hypothyroidism, HTN, COPD, multiple back surgeries, 2021 carpal tunnel release.  PRECAUTIONS: Fall  SUBJECTIVE: Pt reports he is a little sore from last session and the stretches that were performed.  Pt denies any falls or  near falls as well.     PAIN:  Are you having pain? No     TODAY'S TREATMENT:   Therex:   Seated hip marches with 3# AW donned, 2x12 each LE Seated LAQ with 3 AW donned, x12 each LE  Seated hip adduction into physioball, 5 sec holds, x10 Seated LAQ with 3# AW donned, physioball placed between knee for adduction contraction, x10 Seated hip march onto 6" step 15 x each LE with no Weight x 2 sets Seated calf raises 2 sets of 12 reps Seated hamstring stretch x 30 sec x 4 Seated figure 4 stretch x 30 sec x 4  Lateral crabwalks with BlueTB around distal thigh for resistance, 20 ft x2 each direction  Rest breaks taken throughout    PATIENT EDUCATION: Education details: Goals, plan,  exercise technique Person educated: Patient Education method: Explanation, Demonstration, Tactile cues, Verbal cues, and Handouts Education comprehension: verbalized understanding, returned demonstration, verbal cues required, and tactile cues required   HOME EXERCISE PROGRAM:  No Updates today  Access Code: [email or web address] URL: [email or web address] Date: 03/01/2022 Prepared by: Janna Arch  Exercises - Seated March  - 1 x daily - 7 x weekly - 2 sets - 10 reps - 5 hold - Seated Long Arc Quad  - 1 x daily - 7 x weekly - 2 sets - 10 reps - 5 hold - Seated Hip Abduction  - 1 x daily - 7 x weekly - 2 sets - 10 reps - 5 hold - Seated Hip Adduction Isometrics with Ball  - 1 x daily - 7 x weekly - 2 sets - 10 reps - 5 hold - Seated Gluteal Sets  - 1 x daily - 7 x weekly - 2 sets - 10 reps - 5 hold - Seated Heel Toe Raises  - 1 x daily - 7 x weekly - 2 sets - 10 reps - 5 hold LE strength and balance         PT Short Term Goals       PT SHORT TERM GOAL #1   Title Pt will be independent with initial HEP in order to improve strength and balance in order to decrease fall risk and improve function at home and work.    Baseline 02/23/2022- Patient reports not doing much as far as exercise in the home currently. 04/20/2022- Patient reports compliant with HEP and no questions at this time.; 9/7 pt indep   Time 6    Period Weeks    Status Goal met   Target Date 04/06/22              PT Long Term Goals       PT LONG TERM GOAL #1   Title Pt will be independent with final HEP in order to improve strength and balance in order to decrease fall risk and improve function at home and work. 04/20/2022- Patient reports compliant with HEP and no questions at this time.    Baseline 02/23/2022= No formal HEP in place. 9/7: pt performing at least once a day, feels comfortable   Time 12    Period Weeks    Status GOAL MET   Target Date 08/11/2022      PT LONG TERM GOAL #2   Title Pt will  decrease 5TSTS by at least 8 seconds in order to demonstrate clinically significant improvement in LE strength.    Baseline 02/23/2022= 31.52 sec with B hands on knees; 04/20/2022= 16.08 sec with B hands on knees  Time 12    Period Days    Status Goal Met   Target Date 05/18/22      PT LONG TERM GOAL #3   Title Pt will increase 10MWT by at least 0.23 m/s in order to demonstrate clinically significant improvement in community ambulation.    Baseline 02/23/2022= 0.39 m/s using RW; 0.45 m/s; 9/7: 0.37 m/s with QC, 0.74 m/s with RW   Time 12    Period Weeks    Status GOAL MET   Target Date 05/18/22      PT LONG TERM GOAL #4   Title Pt will improve FOTO to target score of 50 to display perceived improvements in ability to complete ADL's.    Baseline 02/23/2022= 47; 9/7: 49   Time 12    Period Weeks    Status Partially met     Target Date 08/11/2022      PT LONG TERM GOAL #5   Title Pt will decrease TUG to below 20 seconds/decrease in order to demonstrate decreased fall risk.    Baseline 02/23/2022=28.27 sec with RW; 04/20/2022= 20.22 with SBQC; 9/7: 14.4 sec with RW, 18.5 sec with SBQC   Time 12    Period Weeks    Status MET   Target Date 05/18/22    PT LONG TERM GOAL #6  Title Pt will decrease TUG to 14 sec or below with SBQC in order to demonstrate decreased fall risk.   Baseline  9/7: 14.4 sec with RW, 18.5 sec with SBQC  Time 12   Period Weeks   Status NEW  Target Date 08/11/2022   PT LONG TERM GOAL #7  Title Patient will increase six minute walk test distance to >700 ft for improved gait ability and increased ease with community participation.  Baseline 9/7: completes 4 minutes and 444 ft with RW. Test discontinued at 4 minutes due to [place].   Time 12   Period Weeks   Status NEW  Target Date 08/11/2022              Plan -     Clinical Impression Statement Pt tolerated all exercises well, and noted some increased soreness following the last session.  Pt still puts  forth good effort throughout the exercises, and was able to tolerate introduction of crab walks in order to strengthen glutes necessary for propr gait mobility.  Pt will continue to benefit from skilled therapy in order to address endurance and balance during ambulation.    Goals were reassessed recently and noted above for progress note.    Personal Factors and Comorbidities Age;Past/Current Experience;Time since onset of injury/illness/exacerbation;Comorbidity 3+    Comorbidities COPD, History of Cancer, myasthenia gravis, chronic lumbar surgical history    Examination-Activity Limitations Lift;Squat;Bend;Stairs;Stand;Transfers;Insurance claims handler;Bathing;Hygiene/Grooming;Dressing;Toileting;Bed Mobility;Caring for Others;Continence    Examination-Participation Restrictions Yard Work;Church;Cleaning;Driving;Community Activity    Stability/Clinical Decision Making Evolving/Moderate complexity    Rehab Potential Good    Clinical Impairments Affecting Rehab Potential multipe comorbidities    PT Frequency 2x / week    PT Duration 12 weeks    PT Treatment/Interventions ADLs/Self Care Home Management;Electrical Stimulation;Moist Heat;Traction;Ultrasound;Gait training;Stair training;Functional mobility training;Therapeutic activities;Therapeutic exercise;Balance training;Neuromuscular re-education;Patient/family education;Manual techniques;Passive range of motion;Dry needling;Joint Manipulations;Cryotherapy;Vestibular;Canalith Repostioning    PT Next Visit Plan standing strengthening and balance, seated strengthening and coordination    PT Home Exercise Plan Progress LE strengthen and functional endurance, continue plan   Consulted and Agree with Plan of Care Patient  Gwenlyn Saran, PT, DPT 05/26/22, 4:13 PM

## 2022-05-31 ENCOUNTER — Ambulatory Visit: Payer: Medicare Other

## 2022-05-31 DIAGNOSIS — G8929 Other chronic pain: Secondary | ICD-10-CM

## 2022-05-31 DIAGNOSIS — R262 Difficulty in walking, not elsewhere classified: Secondary | ICD-10-CM | POA: Diagnosis not present

## 2022-05-31 DIAGNOSIS — M6281 Muscle weakness (generalized): Secondary | ICD-10-CM

## 2022-05-31 DIAGNOSIS — R296 Repeated falls: Secondary | ICD-10-CM

## 2022-05-31 DIAGNOSIS — R278 Other lack of coordination: Secondary | ICD-10-CM

## 2022-05-31 DIAGNOSIS — R2681 Unsteadiness on feet: Secondary | ICD-10-CM

## 2022-05-31 DIAGNOSIS — R269 Unspecified abnormalities of gait and mobility: Secondary | ICD-10-CM

## 2022-05-31 DIAGNOSIS — G959 Disease of spinal cord, unspecified: Secondary | ICD-10-CM

## 2022-05-31 DIAGNOSIS — R2689 Other abnormalities of gait and mobility: Secondary | ICD-10-CM

## 2022-05-31 NOTE — Therapy (Signed)
OUTPATIENT PHYSICAL THERAPY TREATMENT NOTE    Patient Name: Francisco [name] MRN: [id number] DOB:02-02-1946, 76 y.o., male Today's Date: 06/01/2022  PCP: Adin Hector, MD REFERRING PROVIDER: Meade Maw MD    PT End of Session - 05/31/22 1025     Visit Number 21    Number of Visits 59    Date for PT Re-Evaluation 08/11/22    Authorization Time Period 02/23/2022-05/18/2022    Progress Note Due on Visit 20    PT Start Time 1015    PT Stop Time 1059    PT Time Calculation (min) 44 min    Equipment Utilized During Treatment Gait belt    Activity Tolerance Patient tolerated treatment well    Behavior During Therapy WFL for tasks assessed/performed                        Past Medical History:  Diagnosis Date   Arthritis    lower left hip   Atypical angina (HCC)    Bilateral hand numbness    from back surgery   Bronchitis, chronic (HCC)    Cancer ([place])    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; Merkel cell cancer, and Basal cell cancer (twice; 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   Carotid stenosis    CHF (congestive heart failure) (HCC)    CKD (chronic kidney disease)    CKD (chronic kidney disease) stage 3, GFR 30-59 ml/min (HCC)    COPD (chronic obstructive pulmonary disease) ([place])    stage 2   DDD (degenerative disc disease), cervical    Dysrhythmia    post carotid stent bradycardia; PAF 09/2020   GERD (gastroesophageal reflux disease)    Hypercholesterolemia    Hypertension    Hypothyroidism    pt takes Levothyroxine daily   Lumbosacral spinal stenosis    Myasthenia gravis, adult form ([place])    PAD (peripheral artery disease) ([place])    Shortness of breath    Lung MD- Dr Darlin Coco   Sleep apnea    do not use CPAP every night   Past Surgical History:  Procedure Laterality Date   ANTERIOR CERVICAL DECOMP/DISCECTOMY FUSION  07/18/2011   Procedure: ANTERIOR CERVICAL DECOMPRESSION/DISCECTOMY FUSION 2 LEVELS;  Surgeon: Cooper Render Pool;  Location: [place] [facility];  Service:  Neurosurgery;  Laterality: N/A;  cervical five-six, cervical six-seven anterior cervical discectomy and fusion   BACK SURGERY     in [place]     01/2020 Right, 04/2020 Left   CARDIAC CATHETERIZATION     2005 at [facility], no stents   CAROTID PTA/STENT INTERVENTION N/A 09/17/2020   Procedure: CAROTID PTA/STENT INTERVENTION;  Surgeon: Algernon Huxley, MD;  Location: [place] [facility];  Service: Cardiovascular;  Laterality: N/A;   CATARACT EXTRACTION W/PHACO Left 01/06/2020   Procedure: CATARACT EXTRACTION PHACO AND INTRAOCULAR LENS PLACEMENT (IOC) ISTENT INJ LEFT 3.81  00:33.3;  Surgeon: Eulogio Bear, MD;  Location: [place];  Service: Ophthalmology;  Laterality: Left;   CATARACT EXTRACTION W/PHACO Right 02/03/2020   Procedure: CATARACT EXTRACTION PHACO AND INTRAOCULAR LENS PLACEMENT ([place]) RIGHT ISTENT INJ;  Surgeon: Eulogio Bear, MD;  Location: [place];  Service: Ophthalmology;  Laterality: Right;  4.29 0:35.6   COLONOSCOPY     HERNIA REPAIR Left    inguinal hernia repair in 1985   LUMBAR LAMINECTOMY/DECOMPRESSION MICRODISCECTOMY Left 02/24/2014   Procedure: LUMBAR LAMINECTOMY/DECOMPRESSION MICRODISCECTOMY LUMBAR THREE-FOUR, FOUR-FIVE, LEFT FIVE-SACRAL ONE ;  Surgeon:  Charlie Pitter, MD;  Location: [place] [facility];  Service: Neurosurgery;  Laterality: Left;  LUMBAR LAMINECTOMY/DECOMPRESSION MICRODISCECTOMY LUMBAR THREE-FOUR, FOUR-FIVE, LEFT FIVE-SACRAL ONE    LUMBAR LAMINECTOMY/DECOMPRESSION MICRODISCECTOMY N/A 05/03/2021   Procedure: Laminectomy and Foraminotomy - L2-L3;  Surgeon: Earnie Larsson, MD;  Location: [place];  Service: Neurosurgery;  Laterality: N/A;  3C   POSTERIOR CERVICAL FUSION/FORAMINOTOMY N/A 08/07/2020   Procedure: C3-6 POSTERIOR FUSION WITH DECOMPRESSION;  Surgeon: Meade Maw, MD;  Location: [facility];  Service: Neurosurgery;  Laterality: N/A;   PROSTATECTOMY  8/14   [facility] Dr Mare Ferrari    Patient Active  Problem List   Diagnosis Date Noted   SVT (supraventricular tachycardia) ([place]) 12/30/2021   Hypothyroidism 12/30/2021   Parkinson's disease ([place]) 12/30/2021   Elevated troponin 12/30/2021   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   Acquired thrombophilia ([place]) 01/13/2021   History of decompression of median nerve 01/01/2021   Paroxysmal atrial fibrillation ([place]) 10/06/2020   Carotid stenosis, right 09/17/2020   S/P cervical spinal fusion    Leukocytosis    Essential hypertension    Anemia of chronic disease    Postoperative pain    Neuropathic pain    Cervical myelopathy ([place]) 08/07/2020   Preop cardiovascular exam 07/17/2020   SOB (shortness of breath) on exertion 07/17/2020   Leg weakness, bilateral 07/13/2020   Aortic atherosclerosis ([place]) 07/09/2020   Body mass index (BMI) 34.0-34.9, adult 12/25/2019   Myalgia 10/30/2019   Lumbar post-laminectomy syndrome 10/24/2019   PAD (peripheral artery disease) ([place]) 06/06/2019   CKD (chronic kidney disease) stage 3, GFR 30-59 ml/min (HCC) 04/29/2019   B12 deficiency 01/23/2019   Left arm numbness 02/28/2018   Neck pain 02/28/2018   Anemia 02/02/2017   DDD (degenerative disc disease), cervical 02/02/2017   Hypothyroid 02/02/2017   MRSA (methicillin resistant staph aureus) culture positive 02/02/2017   Nocturnal hypoxia 02/02/2017   Senile purpura ([place]) 10/03/2016   Essential hypertension, benign 09/16/2016   Bilateral carotid artery disease ([place]) 09/16/2016   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/18/2016   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   History of prostate cancer 12/21/2015   Left carpal tunnel syndrome 11/11/2015   Kidney stone on left side 07/05/2015   Myasthenia gravis ([place]) 05/26/2015   Radiculitis 01/05/2015   Long-term use of high-risk medication 10/15/2014   Persistent cough 09/10/2014   Pure hypercholesterolemia 07/25/2014   Spinal stenosis, lumbar region, with neurogenic claudication 02/24/2014    Lumbosacral stenosis with neurogenic claudication 02/24/2014   COPD (chronic obstructive pulmonary disease) ([place]) 01/22/2014    REFERRING DIAG: balance disorder   THERAPY DIAG:  Difficulty in walking, not elsewhere classified  Muscle weakness (generalized)  Unsteadiness on feet  Other abnormalities of gait and mobility  Other lack of coordination  Chronic bilateral low back pain, unspecified whether sciatica present  Repeated falls  Cervical myelopathy (HCC)  Abnormality of gait and mobility  Rationale for Evaluation and Treatment Rehabilitation  PERTINENT HISTORY: Francisco [name] is a 76 year old male referred to [facility] neuro for difficulty with balance. He was also just recently dx in May 2023 with Impingement syndrome of left shoulder (M75.42) Impingement syndrome of left shoulder (primary encounter diagnosis) Left shoulder pain, unspecified chronicity Nontraumatic rupture of left proximal biceps tendon Past medical hx includes Lumbar laminectomy 04/2021. C3-6 decompression, fusion on 11/26, myasthenia gravis, 2012 ACDF 5/6, 6/7; OSA, hypothyroidism, HTN, COPD, multiple back surgeries, 2021 carpal tunnel release.  PRECAUTIONS: Fall  SUBJECTIVE: Pt reports he is just having good  and bad days. Reports he is not moving around as well today. States he went to ED last week with A-fib and has appointment later this afternoon with cardiology.     PAIN:  Are you having pain? No     TODAY'S TREATMENT:    BP: pre treatment= 117/70 HR= 75 bpm  Therex:  Nustep L1 LE only x 5 min - Patient rated at 4/10 on RPE scale and total distance= 0.17 mi  Standing hip marches x 10  each LE Standing calf raises x 10 reps Seated hamstring stretch x 30 sec x 3 Seated lumbar stretch with silver theraball x 10 sec hold x 10 reps  Lateral side steps in // bars - up/over 4 obstacles with varying height from 1/2 foam to horizontal positioned yoga block x 4 down and back, CGA Lunge walk with  minimal BUE Support- down and back x 2 Forward walk without UE Support down and back x 2  Rest breaks taken throughout    PATIENT EDUCATION: Education details: Goals, plan, exercise technique Person educated: Patient Education method: Explanation, Demonstration, Tactile cues, Verbal cues, and Handouts Education comprehension: verbalized understanding, returned demonstration, verbal cues required, and tactile cues required   HOME EXERCISE PROGRAM:  No Updates today  Access Code: [email or web address] URL: [email or web address] Date: 03/01/2022 Prepared by: Janna Arch  Exercises - Seated March  - 1 x daily - 7 x weekly - 2 sets - 10 reps - 5 hold - Seated Long Arc Quad  - 1 x daily - 7 x weekly - 2 sets - 10 reps - 5 hold - Seated Hip Abduction  - 1 x daily - 7 x weekly - 2 sets - 10 reps - 5 hold - Seated Hip Adduction Isometrics with Ball  - 1 x daily - 7 x weekly - 2 sets - 10 reps - 5 hold - Seated Gluteal Sets  - 1 x daily - 7 x weekly - 2 sets - 10 reps - 5 hold - Seated Heel Toe Raises  - 1 x daily - 7 x weekly - 2 sets - 10 reps - 5 hold LE strength and balance         PT Short Term Goals       PT SHORT TERM GOAL #1   Title Pt will be independent with initial HEP in order to improve strength and balance in order to decrease fall risk and improve function at home and work.    Baseline 02/23/2022- Patient reports not doing much as far as exercise in the home currently. 04/20/2022- Patient reports compliant with HEP and no questions at this time.; 9/7 pt indep   Time 6    Period Weeks    Status Goal met   Target Date 04/06/22              PT Long Term Goals       PT LONG TERM GOAL #1   Title Pt will be independent with final HEP in order to improve strength and balance in order to decrease fall risk and improve function at home and work. 04/20/2022- Patient reports compliant with HEP and no questions at this time.    Baseline 02/23/2022= No formal HEP in  place. 9/7: pt performing at least once a day, feels comfortable   Time 12    Period Weeks    Status GOAL MET   Target Date 08/11/2022      PT LONG TERM GOAL #2   Title  Pt will decrease 5TSTS by at least 8 seconds in order to demonstrate clinically significant improvement in LE strength.    Baseline 02/23/2022= 31.52 sec with B hands on knees; 04/20/2022= 16.08 sec with B hands on knees   Time 12    Period Days    Status Goal Met   Target Date 05/18/22      PT LONG TERM GOAL #3   Title Pt will increase 10MWT by at least 0.23 m/s in order to demonstrate clinically significant improvement in community ambulation.    Baseline 02/23/2022= 0.39 m/s using RW; 0.45 m/s; 9/7: 0.37 m/s with QC, 0.74 m/s with RW   Time 12    Period Weeks    Status GOAL MET   Target Date 05/18/22      PT LONG TERM GOAL #4   Title Pt will improve FOTO to target score of 50 to display perceived improvements in ability to complete ADL's.    Baseline 02/23/2022= 47; 9/7: 49   Time 12    Period Weeks    Status Partially met     Target Date 08/11/2022      PT LONG TERM GOAL #5   Title Pt will decrease TUG to below 20 seconds/decrease in order to demonstrate decreased fall risk.    Baseline 02/23/2022=28.27 sec with RW; 04/20/2022= 20.22 with SBQC; 9/7: 14.4 sec with RW, 18.5 sec with SBQC   Time 12    Period Weeks    Status MET   Target Date 05/18/22    PT LONG TERM GOAL #6  Title Pt will decrease TUG to 14 sec or below with SBQC in order to demonstrate decreased fall risk.   Baseline  9/7: 14.4 sec with RW, 18.5 sec with SBQC  Time 12   Period Weeks   Status NEW  Target Date 08/11/2022   PT LONG TERM GOAL #7  Title Patient will increase six minute walk test distance to >700 ft for improved gait ability and increased ease with community participation.  Baseline 9/7: completes 4 minutes and 444 ft with RW. Test discontinued at 4 minutes due to [place].   Time 12   Period Weeks   Status NEW  Target Date  08/11/2022              Plan -     Clinical Impression Statement Pt presents with good motivation today yet continues to fatigue quickly overall. He is able to perform therex with good technique yet due to weakness remains at increased risk of falling.  Pt will continue to benefit from skilled therapy in order to address endurance and balance during ambulation.    Goals were reassessed recently and noted above for progress note.    Personal Factors and Comorbidities Age;Past/Current Experience;Time since onset of injury/illness/exacerbation;Comorbidity 3+    Comorbidities COPD, History of Cancer, myasthenia gravis, chronic lumbar surgical history    Examination-Activity Limitations Lift;Squat;Bend;Stairs;Stand;Transfers;Insurance claims handler;Bathing;Hygiene/Grooming;Dressing;Toileting;Bed Mobility;Caring for Others;Continence    Examination-Participation Restrictions Yard Work;Church;Cleaning;Driving;Community Activity    Stability/Clinical Decision Making Evolving/Moderate complexity    Rehab Potential Good    Clinical Impairments Affecting Rehab Potential multipe comorbidities    PT Frequency 2x / week    PT Duration 12 weeks    PT Treatment/Interventions ADLs/Self Care Home Management;Electrical Stimulation;Moist Heat;Traction;Ultrasound;Gait training;Stair training;Functional mobility training;Therapeutic activities;Therapeutic exercise;Balance training;Neuromuscular re-education;Patient/family education;Manual techniques;Passive range of motion;Dry needling;Joint Manipulations;Cryotherapy;Vestibular;Canalith Repostioning    PT Next Visit Plan standing strengthening and balance, seated strengthening and coordination    PT Home Exercise  Plan Progress LE strengthen and functional endurance, continue plan   Consulted and Agree with Plan of Care Patient            Ollen Bowl, PT 06/01/22, 10:06 AM

## 2022-06-02 ENCOUNTER — Ambulatory Visit: Payer: Medicare Other

## 2022-06-02 DIAGNOSIS — R2681 Unsteadiness on feet: Secondary | ICD-10-CM

## 2022-06-02 DIAGNOSIS — M6281 Muscle weakness (generalized): Secondary | ICD-10-CM

## 2022-06-02 DIAGNOSIS — G959 Disease of spinal cord, unspecified: Secondary | ICD-10-CM

## 2022-06-02 DIAGNOSIS — R262 Difficulty in walking, not elsewhere classified: Secondary | ICD-10-CM | POA: Diagnosis not present

## 2022-06-02 DIAGNOSIS — R296 Repeated falls: Secondary | ICD-10-CM

## 2022-06-02 DIAGNOSIS — R2689 Other abnormalities of gait and mobility: Secondary | ICD-10-CM

## 2022-06-02 DIAGNOSIS — R269 Unspecified abnormalities of gait and mobility: Secondary | ICD-10-CM

## 2022-06-02 DIAGNOSIS — R278 Other lack of coordination: Secondary | ICD-10-CM

## 2022-06-02 DIAGNOSIS — G8929 Other chronic pain: Secondary | ICD-10-CM

## 2022-06-02 NOTE — Therapy (Signed)
OUTPATIENT PHYSICAL THERAPY TREATMENT NOTE    Patient Name: Francisco [name] MRN: [id number] DOB:1946-01-31, 76 y.o., male Today's Date: 06/06/2022  PCP: Adin Hector, MD REFERRING PROVIDER: Meade Maw MD    PT End of Session - 06/06/22 1301     Visit Number 22    Number of Visits 22    Date for PT Re-Evaluation 08/11/22    Authorization Time Period 02/23/2022-05/18/2022    Progress Note Due on Visit 43    PT Start Time 1148    PT Stop Time 1228    PT Time Calculation (min) 40 min    Equipment Utilized During Treatment Gait belt    Activity Tolerance Patient tolerated treatment well    Behavior During Therapy WFL for tasks assessed/performed                        Past Medical History:  Diagnosis Date   Arthritis    lower left hip   Atypical angina (HCC)    Bilateral hand numbness    from back surgery   Bronchitis, chronic (HCC)    Cancer ([place])    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; Merkel cell cancer, and Basal cell cancer (twice; 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   Carotid stenosis    CHF (congestive heart failure) (HCC)    CKD (chronic kidney disease)    CKD (chronic kidney disease) stage 3, GFR 30-59 ml/min (HCC)    COPD (chronic obstructive pulmonary disease) ([place])    stage 2   DDD (degenerative disc disease), cervical    Dysrhythmia    post carotid stent bradycardia; PAF 09/2020   GERD (gastroesophageal reflux disease)    Hypercholesterolemia    Hypertension    Hypothyroidism    pt takes Levothyroxine daily   Lumbosacral spinal stenosis    Myasthenia gravis, adult form ([place])    PAD (peripheral artery disease) ([place])    Shortness of breath    Lung MD- Dr Darlin Coco   Sleep apnea    do not use CPAP every night   Past Surgical History:  Procedure Laterality Date   ANTERIOR CERVICAL DECOMP/DISCECTOMY FUSION  07/18/2011   Procedure: ANTERIOR CERVICAL DECOMPRESSION/DISCECTOMY FUSION 2 LEVELS;  Surgeon: Cooper Render Pool;  Location: [facility];  Service:  Neurosurgery;  Laterality: N/A;  cervical five-six, cervical six-seven anterior cervical discectomy and fusion   BACK SURGERY     in [place]     01/2020 Right, 04/2020 Left   CARDIAC CATHETERIZATION     2005 at [facility], no stents   CAROTID PTA/STENT INTERVENTION N/A 09/17/2020   Procedure: CAROTID PTA/STENT INTERVENTION;  Surgeon: Algernon Huxley, MD;  Location: [facility];  Service: Cardiovascular;  Laterality: N/A;   CATARACT EXTRACTION W/PHACO Left 01/06/2020   Procedure: CATARACT EXTRACTION PHACO AND INTRAOCULAR LENS PLACEMENT (IOC) ISTENT INJ LEFT 3.81  00:33.3;  Surgeon: Eulogio Bear, MD;  Location: [facility];  Service: Ophthalmology;  Laterality: Left;   CATARACT EXTRACTION W/PHACO Right 02/03/2020   Procedure: CATARACT EXTRACTION PHACO AND INTRAOCULAR LENS PLACEMENT ([place]) RIGHT ISTENT INJ;  Surgeon: Eulogio Bear, MD;  Location: [facility];  Service: Ophthalmology;  Laterality: Right;  4.29 0:35.6   COLONOSCOPY     HERNIA REPAIR Left    inguinal hernia repair in 1985   LUMBAR LAMINECTOMY/DECOMPRESSION MICRODISCECTOMY Left 02/24/2014   Procedure: LUMBAR LAMINECTOMY/DECOMPRESSION MICRODISCECTOMY LUMBAR THREE-FOUR, FOUR-FIVE, LEFT FIVE-SACRAL ONE ;  Surgeon:  Charlie Pitter, MD;  Location: [facility];  Service: Neurosurgery;  Laterality: Left;  LUMBAR LAMINECTOMY/DECOMPRESSION MICRODISCECTOMY LUMBAR THREE-FOUR, FOUR-FIVE, LEFT FIVE-SACRAL ONE    LUMBAR LAMINECTOMY/DECOMPRESSION MICRODISCECTOMY N/A 05/03/2021   Procedure: Laminectomy and Foraminotomy - L2-L3;  Surgeon: Earnie Larsson, MD;  Location: [facility];  Service: Neurosurgery;  Laterality: N/A;  3C   POSTERIOR CERVICAL FUSION/FORAMINOTOMY N/A 08/07/2020   Procedure: C3-6 POSTERIOR FUSION WITH DECOMPRESSION;  Surgeon: Meade Maw, MD;  Location: [facility];  Service: Neurosurgery;  Laterality: N/A;   PROSTATECTOMY  8/14   [facility] Dr Mare Ferrari    Patient Active  Problem List   Diagnosis Date Noted   SVT (supraventricular tachycardia) ([place]) 12/30/2021   Hypothyroidism 12/30/2021   Parkinson's disease ([place]) 12/30/2021   Elevated troponin 12/30/2021   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   Acquired thrombophilia ([place]) 01/13/2021   History of decompression of median nerve 01/01/2021   Paroxysmal atrial fibrillation ([place]) 10/06/2020   Carotid stenosis, right 09/17/2020   S/P cervical spinal fusion    Leukocytosis    Essential hypertension    Anemia of chronic disease    Postoperative pain    Neuropathic pain    Cervical myelopathy ([place]) 08/07/2020   Preop cardiovascular exam 07/17/2020   SOB (shortness of breath) on exertion 07/17/2020   Leg weakness, bilateral 07/13/2020   Aortic atherosclerosis ([place]) 07/09/2020   Body mass index (BMI) 34.0-34.9, adult 12/25/2019   Myalgia 10/30/2019   Lumbar post-laminectomy syndrome 10/24/2019   PAD (peripheral artery disease) ([place]) 06/06/2019   CKD (chronic kidney disease) stage 3, GFR 30-59 ml/min (HCC) 04/29/2019   B12 deficiency 01/23/2019   Left arm numbness 02/28/2018   Neck pain 02/28/2018   Anemia 02/02/2017   DDD (degenerative disc disease), cervical 02/02/2017   Hypothyroid 02/02/2017   MRSA (methicillin resistant staph aureus) culture positive 02/02/2017   Nocturnal hypoxia 02/02/2017   Senile purpura ([place]) 10/03/2016   Essential hypertension, benign 09/16/2016   Bilateral carotid artery disease ([place]) 09/16/2016   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/18/2016   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   History of prostate cancer 12/21/2015   Left carpal tunnel syndrome 11/11/2015   Kidney stone on left side 07/05/2015   Myasthenia gravis ([place]) 05/26/2015   Radiculitis 01/05/2015   Long-term use of high-risk medication 10/15/2014   Persistent cough 09/10/2014   Pure hypercholesterolemia 07/25/2014   Spinal stenosis, lumbar region, with neurogenic claudication 02/24/2014    Lumbosacral stenosis with neurogenic claudication 02/24/2014   COPD (chronic obstructive pulmonary disease) ([place]) 01/22/2014    REFERRING DIAG: balance disorder   THERAPY DIAG:  Difficulty in walking, not elsewhere classified  Muscle weakness (generalized)  Unsteadiness on feet  Other abnormalities of gait and mobility  Other lack of coordination  Chronic bilateral low back pain, unspecified whether sciatica present  Repeated falls  Cervical myelopathy (HCC)  Abnormality of gait and mobility  Rationale for Evaluation and Treatment Rehabilitation  PERTINENT HISTORY: Francisco [name] is a 76 year old male referred to [facility] neuro for difficulty with balance. He was also just recently dx in May 2023 with Impingement syndrome of left shoulder (M75.42) Impingement syndrome of left shoulder (primary encounter diagnosis) Left shoulder pain, unspecified chronicity Nontraumatic rupture of left proximal biceps tendon Past medical hx includes Lumbar laminectomy 04/2021. C3-6 decompression, fusion on 11/26, myasthenia gravis, 2012 ACDF 5/6, 6/7; OSA, hypothyroidism, HTN, COPD, multiple back surgeries, 2021 carpal tunnel release.  PRECAUTIONS: Fall  SUBJECTIVE: Pt reports having a good day. Reports  going for ablation surgery on 06/30/2022  PAIN:  Are you having pain? No     TODAY'S TREATMENT:     Therex:  Sit to stand while holding onto 2kg ball x 6 reps; 6 reps without ball  Standing hip marches x 10  each LE Standing calf raises x 10 reps Seated hamstring stretch x 30 sec x 3 Seated lumbar stretch with silver theraball x 10 sec hold x 10 reps  Forward step up/over 1/2 foam at support bars - x 15 reps with 3lb AW   Neuromuscular re-ed:   Step tap onto called out colored hedgehog (red or green or gas/brake) - x several min with 3lb AW Step tap onto called out colored hedgehog on top of blue airex pad (red or gree or gas/brake) with 3lb AW x several min Standing step tap onto  colored hedgehog (red/blue/green) x several min Standing step tap onto colored hedgehog (calling out which leg as well as each color) x several min     Rest breaks taken throughout    PATIENT EDUCATION: Education details: Goals, plan, exercise technique Person educated: Patient Education method: Explanation, Demonstration, Tactile cues, Verbal cues, and Handouts Education comprehension: verbalized understanding, returned demonstration, verbal cues required, and tactile cues required   HOME EXERCISE PROGRAM:  No Updates today  Access Code: [email or web address] URL: [email or web address] Date: 03/01/2022 Prepared by: Janna Arch  Exercises - Seated March  - 1 x daily - 7 x weekly - 2 sets - 10 reps - 5 hold - Seated Long Arc Quad  - 1 x daily - 7 x weekly - 2 sets - 10 reps - 5 hold - Seated Hip Abduction  - 1 x daily - 7 x weekly - 2 sets - 10 reps - 5 hold - Seated Hip Adduction Isometrics with Ball  - 1 x daily - 7 x weekly - 2 sets - 10 reps - 5 hold - Seated Gluteal Sets  - 1 x daily - 7 x weekly - 2 sets - 10 reps - 5 hold - Seated Heel Toe Raises  - 1 x daily - 7 x weekly - 2 sets - 10 reps - 5 hold LE strength and balance         PT Short Term Goals       PT SHORT TERM GOAL #1   Title Pt will be independent with initial HEP in order to improve strength and balance in order to decrease fall risk and improve function at home and work.    Baseline 02/23/2022- Patient reports not doing much as far as exercise in the home currently. 04/20/2022- Patient reports compliant with HEP and no questions at this time.; 9/7 pt indep   Time 6    Period Weeks    Status Goal met   Target Date 04/06/22              PT Long Term Goals       PT LONG TERM GOAL #1   Title Pt will be independent with final HEP in order to improve strength and balance in order to decrease fall risk and improve function at home and work. 04/20/2022- Patient reports compliant with HEP and no  questions at this time.    Baseline 02/23/2022= No formal HEP in place. 9/7: pt performing at least once a day, feels comfortable   Time 12    Period Weeks    Status GOAL MET   Target Date 08/11/2022  PT LONG TERM GOAL #2   Title Pt will decrease 5TSTS by at least 8 seconds in order to demonstrate clinically significant improvement in LE strength.    Baseline 02/23/2022= 31.52 sec with B hands on knees; 04/20/2022= 16.08 sec with B hands on knees   Time 12    Period Days    Status Goal Met   Target Date 05/18/22      PT LONG TERM GOAL #3   Title Pt will increase 10MWT by at least 0.23 m/s in order to demonstrate clinically significant improvement in community ambulation.    Baseline 02/23/2022= 0.39 m/s using RW; 0.45 m/s; 9/7: 0.37 m/s with QC, 0.74 m/s with RW   Time 12    Period Weeks    Status GOAL MET   Target Date 05/18/22      PT LONG TERM GOAL #4   Title Pt will improve FOTO to target score of 50 to display perceived improvements in ability to complete ADL's.    Baseline 02/23/2022= 47; 9/7: 49   Time 12    Period Weeks    Status Partially met     Target Date 08/11/2022      PT LONG TERM GOAL #5   Title Pt will decrease TUG to below 20 seconds/decrease in order to demonstrate decreased fall risk.    Baseline 02/23/2022=28.27 sec with RW; 04/20/2022= 20.22 with SBQC; 9/7: 14.4 sec with RW, 18.5 sec with SBQC   Time 12    Period Weeks    Status MET   Target Date 05/18/22    PT LONG TERM GOAL #6  Title Pt will decrease TUG to 14 sec or below with SBQC in order to demonstrate decreased fall risk.   Baseline  9/7: 14.4 sec with RW, 18.5 sec with SBQC  Time 12   Period Weeks   Status NEW  Target Date 08/11/2022   PT LONG TERM GOAL #7  Title Patient will increase six minute walk test distance to >700 ft for improved gait ability and increased ease with community participation.  Baseline 9/7: completes 4 minutes and 444 ft with RW. Test discontinued at 4 minutes due to  [place].   Time 12   Period Weeks   Status NEW  Target Date 08/11/2022              Plan -     Clinical Impression Statement Pt presents with good motivation today yet continues to fatigue quickly with standing and coordination activities overall. He was able to move his foot from one hedgehog to next with good reaction despite weakness but does fatigue quickly throughout workout.  Pt will continue to benefit from skilled therapy in order to address endurance and balance during ambulation.    Goals were reassessed recently and noted above for progress note.    Personal Factors and Comorbidities Age;Past/Current Experience;Time since onset of injury/illness/exacerbation;Comorbidity 3+    Comorbidities COPD, History of Cancer, myasthenia gravis, chronic lumbar surgical history    Examination-Activity Limitations Lift;Squat;Bend;Stairs;Stand;Transfers;Insurance claims handler;Bathing;Hygiene/Grooming;Dressing;Toileting;Bed Mobility;Caring for Others;Continence    Examination-Participation Restrictions Yard Work;Church;Cleaning;Driving;Community Activity    Stability/Clinical Decision Making Evolving/Moderate complexity    Rehab Potential Good    Clinical Impairments Affecting Rehab Potential multipe comorbidities    PT Frequency 2x / week    PT Duration 12 weeks    PT Treatment/Interventions ADLs/Self Care Home Management;Electrical Stimulation;Moist Heat;Traction;Ultrasound;Gait training;Stair training;Functional mobility training;Therapeutic activities;Therapeutic exercise;Balance training;Neuromuscular re-education;Patient/family education;Manual techniques;Passive range of motion;Dry needling;Joint Manipulations;Cryotherapy;Vestibular;Canalith Repostioning    PT  Next Visit Plan standing strengthening and balance, seated strengthening and coordination    PT Home Exercise Plan Progress LE strengthen and functional endurance, continue plan   Consulted and Agree with Plan of  Care Patient            Ollen Bowl, PT 06/06/22, 1:04 PM

## 2022-06-07 ENCOUNTER — Ambulatory Visit: Payer: Medicare Other

## 2022-06-07 DIAGNOSIS — R2689 Other abnormalities of gait and mobility: Secondary | ICD-10-CM

## 2022-06-07 DIAGNOSIS — R296 Repeated falls: Secondary | ICD-10-CM

## 2022-06-07 DIAGNOSIS — M6281 Muscle weakness (generalized): Secondary | ICD-10-CM

## 2022-06-07 DIAGNOSIS — R278 Other lack of coordination: Secondary | ICD-10-CM

## 2022-06-07 DIAGNOSIS — R2681 Unsteadiness on feet: Secondary | ICD-10-CM

## 2022-06-07 DIAGNOSIS — G8929 Other chronic pain: Secondary | ICD-10-CM

## 2022-06-07 DIAGNOSIS — R262 Difficulty in walking, not elsewhere classified: Secondary | ICD-10-CM

## 2022-06-07 DIAGNOSIS — G959 Disease of spinal cord, unspecified: Secondary | ICD-10-CM

## 2022-06-07 DIAGNOSIS — R269 Unspecified abnormalities of gait and mobility: Secondary | ICD-10-CM

## 2022-06-07 DIAGNOSIS — M545 Low back pain, unspecified: Secondary | ICD-10-CM

## 2022-06-07 NOTE — Therapy (Signed)
OUTPATIENT PHYSICAL THERAPY TREATMENT NOTE    Patient Name: Francisco [name] MRN: [id number] DOB:04-13-1946, 76 y.o., male Today's Date: 06/08/2022  PCP: Adin Hector, MD REFERRING PROVIDER: Meade Maw MD    PT End of Session - 06/07/22 1537     Visit Number 23    Number of Visits 39    Date for PT Re-Evaluation 08/11/22    Authorization Time Period 02/23/2022-05/18/2022    Progress Note Due on Visit 28    PT Start Time 1517    PT Stop Time 1556    PT Time Calculation (min) 39 min    Equipment Utilized During Treatment Gait belt    Activity Tolerance Patient tolerated treatment well    Behavior During Therapy WFL for tasks assessed/performed                        Past Medical History:  Diagnosis Date   Arthritis    lower left hip   Atypical angina (HCC)    Bilateral hand numbness    from back surgery   Bronchitis, chronic (HCC)    Cancer ([place])    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; Merkel cell cancer, and Basal cell cancer (twice; 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   Carotid stenosis    CHF (congestive heart failure) (HCC)    CKD (chronic kidney disease)    CKD (chronic kidney disease) stage 3, GFR 30-59 ml/min (HCC)    COPD (chronic obstructive pulmonary disease) ([place])    stage 2   DDD (degenerative disc disease), cervical    Dysrhythmia    post carotid stent bradycardia; PAF 09/2020   GERD (gastroesophageal reflux disease)    Hypercholesterolemia    Hypertension    Hypothyroidism    pt takes Levothyroxine daily   Lumbosacral spinal stenosis    Myasthenia gravis, adult form ([place])    PAD (peripheral artery disease) ([place])    Shortness of breath    Lung MD- Dr Darlin Coco   Sleep apnea    do not use CPAP every night   Past Surgical History:  Procedure Laterality Date   ANTERIOR CERVICAL DECOMP/DISCECTOMY FUSION  07/18/2011   Procedure: ANTERIOR CERVICAL DECOMPRESSION/DISCECTOMY FUSION 2 LEVELS;  Surgeon: Cooper Render Pool;  Location: [facility];  Service:  Neurosurgery;  Laterality: N/A;  cervical five-six, cervical six-seven anterior cervical discectomy and fusion   BACK SURGERY     in [place]     01/2020 Right, 04/2020 Left   CARDIAC CATHETERIZATION     2005 at [facility], no stents   CAROTID PTA/STENT INTERVENTION N/A 09/17/2020   Procedure: CAROTID PTA/STENT INTERVENTION;  Surgeon: Algernon Huxley, MD;  Location: [facility];  Service: Cardiovascular;  Laterality: N/A;   CATARACT EXTRACTION W/PHACO Left 01/06/2020   Procedure: CATARACT EXTRACTION PHACO AND INTRAOCULAR LENS PLACEMENT (IOC) ISTENT INJ LEFT 3.81  00:33.3;  Surgeon: Eulogio Bear, MD;  Location: [place];  Service: Ophthalmology;  Laterality: Left;   CATARACT EXTRACTION W/PHACO Right 02/03/2020   Procedure: CATARACT EXTRACTION PHACO AND INTRAOCULAR LENS PLACEMENT ([place]) RIGHT ISTENT INJ;  Surgeon: Eulogio Bear, MD;  Location: [place];  Service: Ophthalmology;  Laterality: Right;  4.29 0:35.6   COLONOSCOPY     HERNIA REPAIR Left    inguinal hernia repair in 1985   LUMBAR LAMINECTOMY/DECOMPRESSION MICRODISCECTOMY Left 02/24/2014   Procedure: LUMBAR LAMINECTOMY/DECOMPRESSION MICRODISCECTOMY LUMBAR THREE-FOUR, FOUR-FIVE, LEFT FIVE-SACRAL ONE ;  Surgeon:  Charlie Pitter, MD;  Location: [facility];  Service: Neurosurgery;  Laterality: Left;  LUMBAR LAMINECTOMY/DECOMPRESSION MICRODISCECTOMY LUMBAR THREE-FOUR, FOUR-FIVE, LEFT FIVE-SACRAL ONE    LUMBAR LAMINECTOMY/DECOMPRESSION MICRODISCECTOMY N/A 05/03/2021   Procedure: Laminectomy and Foraminotomy - L2-L3;  Surgeon: Earnie Larsson, MD;  Location: [facility];  Service: Neurosurgery;  Laterality: N/A;  3C   POSTERIOR CERVICAL FUSION/FORAMINOTOMY N/A 08/07/2020   Procedure: C3-6 POSTERIOR FUSION WITH DECOMPRESSION;  Surgeon: Meade Maw, MD;  Location: [facility];  Service: Neurosurgery;  Laterality: N/A;   PROSTATECTOMY  8/14   [facility] Dr Mare Ferrari    Patient Active  Problem List   Diagnosis Date Noted   SVT (supraventricular tachycardia) ([place]) 12/30/2021   Hypothyroidism 12/30/2021   Parkinson's disease ([place]) 12/30/2021   Elevated troponin 12/30/2021   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   Acquired thrombophilia ([place]) 01/13/2021   History of decompression of median nerve 01/01/2021   Paroxysmal atrial fibrillation ([place]) 10/06/2020   Carotid stenosis, right 09/17/2020   S/P cervical spinal fusion    Leukocytosis    Essential hypertension    Anemia of chronic disease    Postoperative pain    Neuropathic pain    Cervical myelopathy ([place]) 08/07/2020   Preop cardiovascular exam 07/17/2020   SOB (shortness of breath) on exertion 07/17/2020   Leg weakness, bilateral 07/13/2020   Aortic atherosclerosis ([place]) 07/09/2020   Body mass index (BMI) 34.0-34.9, adult 12/25/2019   Myalgia 10/30/2019   Lumbar post-laminectomy syndrome 10/24/2019   PAD (peripheral artery disease) ([place]) 06/06/2019   CKD (chronic kidney disease) stage 3, GFR 30-59 ml/min (HCC) 04/29/2019   B12 deficiency 01/23/2019   Left arm numbness 02/28/2018   Neck pain 02/28/2018   Anemia 02/02/2017   DDD (degenerative disc disease), cervical 02/02/2017   Hypothyroid 02/02/2017   MRSA (methicillin resistant staph aureus) culture positive 02/02/2017   Nocturnal hypoxia 02/02/2017   Senile purpura ([place]) 10/03/2016   Essential hypertension, benign 09/16/2016   Bilateral carotid artery disease ([place]) 09/16/2016   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/18/2016   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   History of prostate cancer 12/21/2015   Left carpal tunnel syndrome 11/11/2015   Kidney stone on left side 07/05/2015   Myasthenia gravis ([place]) 05/26/2015   Radiculitis 01/05/2015   Long-term use of high-risk medication 10/15/2014   Persistent cough 09/10/2014   Pure hypercholesterolemia 07/25/2014   Spinal stenosis, lumbar region, with neurogenic claudication 02/24/2014    Lumbosacral stenosis with neurogenic claudication 02/24/2014   COPD (chronic obstructive pulmonary disease) ([place]) 01/22/2014    REFERRING DIAG: balance disorder   THERAPY DIAG:  Difficulty in walking, not elsewhere classified  Muscle weakness (generalized)  Unsteadiness on feet  Other abnormalities of gait and mobility  Other lack of coordination  Chronic bilateral low back pain, unspecified whether sciatica present  Repeated falls  Cervical myelopathy (HCC)  Abnormality of gait and mobility  Rationale for Evaluation and Treatment Rehabilitation  PERTINENT HISTORY: Rayyan Burley is a 76 year old male referred to [facility] neuro for difficulty with balance. He was also just recently dx in May 2023 with Impingement syndrome of left shoulder (M75.42) Impingement syndrome of left shoulder (primary encounter diagnosis) Left shoulder pain, unspecified chronicity Nontraumatic rupture of left proximal biceps tendon Past medical hx includes Lumbar laminectomy 04/2021. C3-6 decompression, fusion on 11/26, myasthenia gravis, 2012 ACDF 5/6, 6/7; OSA, hypothyroidism, HTN, COPD, multiple back surgeries, 2021 carpal tunnel release.  PRECAUTIONS: Fall  SUBJECTIVE: Pt reports having a pretty good start  to the week. Feeling stronger over past week.   PAIN:  Are you having pain? No     TODAY'S TREATMENT:     Therex:  Sit to stand while holding onto 2kg ball x 10 reps (patient fatigued yet able to complete)  Standing hip marches  with GTB tied across // bars - 10 reps without UE support focusing on SLS and  10 reps with BUE Support each LE focusing on Hip strength Precor Leg press 40 # BLE 3 sets of 10 reps Standing hip abd (dual band - one at distal quad and another and distal lower leg  Forward step up/over 1/2 foam at support bars - x 15 reps with 3lb AW  Standing hip flex/abd up/over red TB Tied approx 8 in from floor x approx 10 reps each- Patient requested to stop secondary to  fatigue.   Tandem gait- Forward/retro- With VC to take large steps- Mild difficulty with retro steps- improved with VC and practice.  Rest breaks taken throughout    PATIENT EDUCATION: Education details: Goals, plan, exercise technique Person educated: Patient Education method: Explanation, Demonstration, Tactile cues, Verbal cues, and Handouts Education comprehension: verbalized understanding, returned demonstration, verbal cues required, and tactile cues required   HOME EXERCISE PROGRAM:  No Updates today  Access Code: [email or web address] URL: [email or web address] Date: 03/01/2022 Prepared by: Janna Arch  Exercises - Seated March  - 1 x daily - 7 x weekly - 2 sets - 10 reps - 5 hold - Seated Long Arc Quad  - 1 x daily - 7 x weekly - 2 sets - 10 reps - 5 hold - Seated Hip Abduction  - 1 x daily - 7 x weekly - 2 sets - 10 reps - 5 hold - Seated Hip Adduction Isometrics with Ball  - 1 x daily - 7 x weekly - 2 sets - 10 reps - 5 hold - Seated Gluteal Sets  - 1 x daily - 7 x weekly - 2 sets - 10 reps - 5 hold - Seated Heel Toe Raises  - 1 x daily - 7 x weekly - 2 sets - 10 reps - 5 hold LE strength and balance         PT Short Term Goals       PT SHORT TERM GOAL #1   Title Pt will be independent with initial HEP in order to improve strength and balance in order to decrease fall risk and improve function at home and work.    Baseline 02/23/2022- Patient reports not doing much as far as exercise in the home currently. 04/20/2022- Patient reports compliant with HEP and no questions at this time.; 9/7 pt indep   Time 6    Period Weeks    Status Goal met   Target Date 04/06/22              PT Long Term Goals       PT LONG TERM GOAL #1   Title Pt will be independent with final HEP in order to improve strength and balance in order to decrease fall risk and improve function at home and work. 04/20/2022- Patient reports compliant with HEP and no questions at this time.     Baseline 02/23/2022= No formal HEP in place. 9/7: pt performing at least once a day, feels comfortable   Time 12    Period Weeks    Status GOAL MET   Target Date 08/11/2022      PT LONG TERM GOAL #  2   Title Pt will decrease 5TSTS by at least 8 seconds in order to demonstrate clinically significant improvement in LE strength.    Baseline 02/23/2022= 31.52 sec with B hands on knees; 04/20/2022= 16.08 sec with B hands on knees   Time 12    Period Days    Status Goal Met   Target Date 05/18/22      PT LONG TERM GOAL #3   Title Pt will increase 10MWT by at least 0.23 m/s in order to demonstrate clinically significant improvement in community ambulation.    Baseline 02/23/2022= 0.39 m/s using RW; 0.45 m/s; 9/7: 0.37 m/s with QC, 0.74 m/s with RW   Time 12    Period Weeks    Status GOAL MET   Target Date 05/18/22      PT LONG TERM GOAL #4   Title Pt will improve FOTO to target score of 50 to display perceived improvements in ability to complete ADL's.    Baseline 02/23/2022= 47; 9/7: 49   Time 12    Period Weeks    Status Partially met     Target Date 08/11/2022      PT LONG TERM GOAL #5   Title Pt will decrease TUG to below 20 seconds/decrease in order to demonstrate decreased fall risk.    Baseline 02/23/2022=28.27 sec with RW; 04/20/2022= 20.22 with SBQC; 9/7: 14.4 sec with RW, 18.5 sec with SBQC   Time 12    Period Weeks    Status MET   Target Date 05/18/22    PT LONG TERM GOAL #6  Title Pt will decrease TUG to 14 sec or below with SBQC in order to demonstrate decreased fall risk.   Baseline  9/7: 14.4 sec with RW, 18.5 sec with SBQC  Time 12   Period Weeks   Status NEW  Target Date 08/11/2022   PT LONG TERM GOAL #7  Title Patient will increase six minute walk test distance to >700 ft for improved gait ability and increased ease with community participation.  Baseline 9/7: completes 4 minutes and 444 ft with RW. Test discontinued at 4 minutes due to [place].   Time 12    Period Weeks   Status NEW  Target Date 08/11/2022              Plan -     Clinical Impression Statement  Patient was able to progress his LE strength as seen by improved sit to stand and standing activities. He continues to be limited by fatigue but less limited than previous couple of weeks. He performed well on Leg press and pushed himself to complete 3 sets.  Pt will continue to benefit from skilled therapy in order to address endurance and balance during ambulation for improved mobility and quality of life.    Goals were reassessed recently and noted above for progress note.    Personal Factors and Comorbidities Age;Past/Current Experience;Time since onset of injury/illness/exacerbation;Comorbidity 3+    Comorbidities COPD, History of Cancer, myasthenia gravis, chronic lumbar surgical history    Examination-Activity Limitations Lift;Squat;Bend;Stairs;Stand;Transfers;Insurance claims handler;Bathing;Hygiene/Grooming;Dressing;Toileting;Bed Mobility;Caring for Others;Continence    Examination-Participation Restrictions Yard Work;Church;Cleaning;Driving;Community Activity    Stability/Clinical Decision Making Evolving/Moderate complexity    Rehab Potential Good    Clinical Impairments Affecting Rehab Potential multipe comorbidities    PT Frequency 2x / week    PT Duration 12 weeks    PT Treatment/Interventions ADLs/Self Care Home Management;Electrical Stimulation;Moist Heat;Traction;Ultrasound;Gait training;Stair training;Functional mobility training;Therapeutic activities;Therapeutic exercise;Balance training;Neuromuscular re-education;Patient/family education;Manual techniques;Passive  range of motion;Dry needling;Joint Manipulations;Cryotherapy;Vestibular;Canalith Repostioning    PT Next Visit Plan standing strengthening and balance, seated strengthening and coordination    PT Home Exercise Plan Progress LE strengthen and functional endurance, continue plan   Consulted  and Agree with Plan of Care Patient            Ollen Bowl, PT 06/08/22, 10:09 AM

## 2022-06-09 ENCOUNTER — Ambulatory Visit: Payer: Medicare Other

## 2022-06-14 ENCOUNTER — Ambulatory Visit: Payer: Medicare Other | Attending: Neurosurgery | Admitting: Physical Therapy

## 2022-06-14 DIAGNOSIS — R2681 Unsteadiness on feet: Secondary | ICD-10-CM | POA: Diagnosis present

## 2022-06-14 DIAGNOSIS — M5442 Lumbago with sciatica, left side: Secondary | ICD-10-CM | POA: Insufficient documentation

## 2022-06-14 DIAGNOSIS — G8929 Other chronic pain: Secondary | ICD-10-CM | POA: Insufficient documentation

## 2022-06-14 DIAGNOSIS — M6281 Muscle weakness (generalized): Secondary | ICD-10-CM | POA: Insufficient documentation

## 2022-06-14 DIAGNOSIS — R296 Repeated falls: Secondary | ICD-10-CM | POA: Diagnosis present

## 2022-06-14 DIAGNOSIS — G959 Disease of spinal cord, unspecified: Secondary | ICD-10-CM | POA: Insufficient documentation

## 2022-06-14 DIAGNOSIS — M545 Low back pain, unspecified: Secondary | ICD-10-CM | POA: Diagnosis present

## 2022-06-14 DIAGNOSIS — R2689 Other abnormalities of gait and mobility: Secondary | ICD-10-CM | POA: Diagnosis present

## 2022-06-14 DIAGNOSIS — R278 Other lack of coordination: Secondary | ICD-10-CM | POA: Insufficient documentation

## 2022-06-14 DIAGNOSIS — R262 Difficulty in walking, not elsewhere classified: Secondary | ICD-10-CM | POA: Diagnosis present

## 2022-06-14 DIAGNOSIS — R269 Unspecified abnormalities of gait and mobility: Secondary | ICD-10-CM | POA: Diagnosis present

## 2022-06-14 NOTE — Therapy (Signed)
OUTPATIENT PHYSICAL THERAPY TREATMENT NOTE    Patient Name: Francisco [name] MRN: [id number] DOB:[birth year], 76 y.o., male Today's Date: 06/14/2022  PCP: Adin Hector, MD REFERRING PROVIDER: Meade Maw MD    PT End of Session - 06/14/22 1437     Visit Number 24    Number of Visits 1    Date for PT Re-Evaluation 08/11/22    Authorization Time Period -08/11/22    Progress Note Due on Visit 33    PT Start Time 1434    PT Stop Time 1102    PT Time Calculation (min) 40 min    Equipment Utilized During Treatment Gait belt    Activity Tolerance Patient tolerated treatment well    Behavior During Therapy WFL for tasks assessed/performed                         Past Medical History:  Diagnosis Date   Arthritis    lower left hip   Atypical angina (HCC)    Bilateral hand numbness    from back surgery   Bronchitis, chronic ([place])    Cancer ([place])    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; Merkel cell cancer, and Basal cell cancer (twice; 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   Carotid stenosis    CHF (congestive heart failure) (HCC)    CKD (chronic kidney disease)    CKD (chronic kidney disease) stage 3, GFR 30-59 ml/min (HCC)    COPD (chronic obstructive pulmonary disease) ([place])    stage 2   DDD (degenerative disc disease), cervical    Dysrhythmia    post carotid stent bradycardia; PAF 09/2020   GERD (gastroesophageal reflux disease)    Hypercholesterolemia    Hypertension    Hypothyroidism    pt takes Levothyroxine daily   Lumbosacral spinal stenosis    Myasthenia gravis, adult form ([place])    PAD (peripheral artery disease) ([place])    Shortness of breath    Lung MD- Dr Darlin Coco   Sleep apnea    do not use CPAP every night   Past Surgical History:  Procedure Laterality Date   ANTERIOR CERVICAL DECOMP/DISCECTOMY FUSION  07/18/2011   Procedure: ANTERIOR CERVICAL DECOMPRESSION/DISCECTOMY FUSION 2 LEVELS;  Surgeon: Cooper Render Pool;  Location: [facility];  Service:  Neurosurgery;  Laterality: N/A;  cervical five-six, cervical six-seven anterior cervical discectomy and fusion   BACK SURGERY     in [place]     01/2020 Right, 04/2020 Left   CARDIAC CATHETERIZATION     2005 at [facility], no stents   CAROTID PTA/STENT INTERVENTION N/A 09/17/2020   Procedure: CAROTID PTA/STENT INTERVENTION;  Surgeon: Algernon Huxley, MD;  Location: [facility];  Service: Cardiovascular;  Laterality: N/A;   CATARACT EXTRACTION W/PHACO Left 01/06/2020   Procedure: CATARACT EXTRACTION PHACO AND INTRAOCULAR LENS PLACEMENT (IOC) ISTENT INJ LEFT 3.81  00:33.3;  Surgeon: Eulogio Bear, MD;  Location: [facility];  Service: Ophthalmology;  Laterality: Left;   CATARACT EXTRACTION W/PHACO Right 02/03/2020   Procedure: CATARACT EXTRACTION PHACO AND INTRAOCULAR LENS PLACEMENT ([place]) RIGHT ISTENT INJ;  Surgeon: Eulogio Bear, MD;  Location: [place];  Service: Ophthalmology;  Laterality: Right;  4.29 0:35.6   COLONOSCOPY     HERNIA REPAIR Left    inguinal hernia repair in [place] MICRODISCECTOMY Left 02/24/2014   Procedure: LUMBAR LAMINECTOMY/DECOMPRESSION MICRODISCECTOMY LUMBAR THREE-FOUR, FOUR-FIVE, LEFT FIVE-SACRAL ONE ;  Surgeon: Charlie Pitter, MD;  Location: [facility];  Service: Neurosurgery;  Laterality: Left;  LUMBAR LAMINECTOMY/DECOMPRESSION MICRODISCECTOMY LUMBAR THREE-FOUR, FOUR-FIVE, LEFT FIVE-SACRAL ONE    LUMBAR LAMINECTOMY/DECOMPRESSION MICRODISCECTOMY N/A 05/03/2021   Procedure: Laminectomy and Foraminotomy - L2-L3;  Surgeon: Earnie Larsson, MD;  Location: [facility];  Service: Neurosurgery;  Laterality: N/A;  3C   POSTERIOR CERVICAL FUSION/FORAMINOTOMY N/A 08/07/2020   Procedure: C3-6 POSTERIOR FUSION WITH DECOMPRESSION;  Surgeon: Meade Maw, MD;  Location: [facility];  Service: Neurosurgery;  Laterality: N/A;   PROSTATECTOMY  8/14   [facility] Dr Mare Ferrari    Patient Active  Problem List   Diagnosis Date Noted   SVT (supraventricular tachycardia) 12/30/2021   Hypothyroidism 12/30/2021   Parkinson's disease 12/30/2021   Elevated troponin 12/30/2021   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   Acquired thrombophilia ([place]) 01/13/2021   History of decompression of median nerve 01/01/2021   Paroxysmal atrial fibrillation ([place]) 10/06/2020   Carotid stenosis, right 09/17/2020   S/P cervical spinal fusion    Leukocytosis    Essential hypertension    Anemia of chronic disease    Postoperative pain    Neuropathic pain    Cervical myelopathy ([place]) 08/07/2020   Preop cardiovascular exam 07/17/2020   SOB (shortness of breath) on exertion 07/17/2020   Leg weakness, bilateral 07/13/2020   Aortic atherosclerosis ([place]) 07/09/2020   Body mass index (BMI) 34.0-34.9, adult 12/25/2019   Myalgia 10/30/2019   Lumbar post-laminectomy syndrome 10/24/2019   PAD (peripheral artery disease) ([place]) 06/06/2019   CKD (chronic kidney disease) stage 3, GFR 30-59 ml/min (HCC) 04/29/2019   B12 deficiency 01/23/2019   Left arm numbness 02/28/2018   Neck pain 02/28/2018   Anemia 02/02/2017   DDD (degenerative disc disease), cervical 02/02/2017   Hypothyroid 02/02/2017   MRSA (methicillin resistant staph aureus) culture positive 02/02/2017   Nocturnal hypoxia 02/02/2017   Senile purpura ([place]) 10/03/2016   Essential hypertension, benign 09/16/2016   Bilateral carotid artery disease ([place]) 09/16/2016   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/18/2016   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   History of prostate cancer 12/21/2015   Left carpal tunnel syndrome 11/11/2015   Kidney stone on left side 07/05/2015   Myasthenia gravis ([place]) 05/26/2015   Radiculitis 01/05/2015   Long-term use of high-risk medication 10/15/2014   Persistent cough 09/10/2014   Pure hypercholesterolemia 07/25/2014   Spinal stenosis, lumbar region, with neurogenic claudication 02/24/2014    Lumbosacral stenosis with neurogenic claudication 02/24/2014   COPD (chronic obstructive pulmonary disease) ([place]) 01/22/2014    REFERRING DIAG: balance disorder   THERAPY DIAG:  Difficulty in walking, not elsewhere classified  Muscle weakness (generalized)  Unsteadiness on feet  Other abnormalities of gait and mobility  Rationale for Evaluation and Treatment Rehabilitation  PERTINENT HISTORY: Francisco [name] is a 76 year old male referred to [facility] neuro for difficulty with balance. He was also just recently dx in May 2023 with Impingement syndrome of left shoulder (M75.42) Impingement syndrome of left shoulder (primary encounter diagnosis) Left shoulder pain, unspecified chronicity Nontraumatic rupture of left proximal biceps tendon Past medical hx includes Lumbar laminectomy 04/2021. C3-6 decompression, fusion on 11/26, myasthenia gravis, 2012 ACDF 5/6, 6/7; OSA, hypothyroidism, HTN, COPD, multiple back surgeries, 2021 carpal tunnel release.  PRECAUTIONS: Fall  SUBJECTIVE: Pt reports significant weakness over the past several days.   PAIN:  Are you having pain? No     TODAY'S TREATMENT: 06/14/22     Therex:  Sit to stand while holding  onto  1 x 10 reps, 2 x 5 reps  (patient fatigued yet able to complete) SPO2 measured   pre and post pre: 96%, post 97% taken on L UE   Standing hip marches  with GTB tied across // bars - 10 reps without UE support focusing on SLS and  10 reps with BUE Support each LE focusing on Hip strength  Standing balance on airex with head turns ( 45 sec ea)  Seated hip adduction ball squeeze 15 x 5 sec hold  Seated hip abduction with GTB x 15 reps with 5 second hold   Seated glute sets 15 x 5 second holds   Precor Leg press 40 # BLE 3 sets of 10 reps  Rest breaks taken throughout   Pt required rest breaks due fatigue, PT was quick to ask when pt appeared to be fatiguing in order to prevent excessive fatigue.   PATIENT EDUCATION: Education  details: Goals, plan, exercise technique Person educated: Patient Education method: Explanation, Demonstration, Tactile cues, Verbal cues, and Handouts Education comprehension: verbalized understanding, returned demonstration, verbal cues required, and tactile cues required   HOME EXERCISE PROGRAM:  No Updates today  Access Code: [email or web address] URL: [email or web address] Date: 03/01/2022 Prepared by: Janna Arch  Exercises - Seated March  - 1 x daily - 7 x weekly - 2 sets - 10 reps - 5 hold - Seated Long Arc Quad  - 1 x daily - 7 x weekly - 2 sets - 10 reps - 5 hold - Seated Hip Abduction  - 1 x daily - 7 x weekly - 2 sets - 10 reps - 5 hold - Seated Hip Adduction Isometrics with Ball  - 1 x daily - 7 x weekly - 2 sets - 10 reps - 5 hold - Seated Gluteal Sets  - 1 x daily - 7 x weekly - 2 sets - 10 reps - 5 hold - Seated Heel Toe Raises  - 1 x daily - 7 x weekly - 2 sets - 10 reps - 5 hold LE strength and balance         PT Short Term Goals       PT SHORT TERM GOAL #1   Title Pt will be independent with initial HEP in order to improve strength and balance in order to decrease fall risk and improve function at home and work.    Baseline 02/23/2022- Patient reports not doing much as far as exercise in the home currently. 04/20/2022- Patient reports compliant with HEP and no questions at this time.; 9/7 pt indep   Time 6    Period Weeks    Status Goal met   Target Date 04/06/22              PT Long Term Goals       PT LONG TERM GOAL #1   Title Pt will be independent with final HEP in order to improve strength and balance in order to decrease fall risk and improve function at home and work. 04/20/2022- Patient reports compliant with HEP and no questions at this time.    Baseline 02/23/2022= No formal HEP in place. 9/7: pt performing at least once a day, feels comfortable   Time 12    Period Weeks    Status GOAL MET   Target Date 08/11/2022      PT LONG TERM GOAL  #2   Title Pt will decrease 5TSTS by at least 8 seconds in order to demonstrate clinically significant  improvement in LE strength.    Baseline 02/23/2022= 31.52 sec with B hands on knees; 04/20/2022= 16.08 sec with B hands on knees   Time 12    Period Days    Status Goal Met   Target Date 05/18/22      PT LONG TERM GOAL #3   Title Pt will increase 10MWT by at least 0.23 m/s in order to demonstrate clinically significant improvement in community ambulation.    Baseline 02/23/2022= 0.39 m/s using RW; 0.45 m/s; 9/7: 0.37 m/s with QC, 0.74 m/s with RW   Time 12    Period Weeks    Status GOAL MET   Target Date 05/18/22      PT LONG TERM GOAL #4   Title Pt will improve FOTO to target score of 50 to display perceived improvements in ability to complete ADL's.    Baseline 02/23/2022= 47; 9/7: 49   Time 12    Period Weeks    Status Partially met     Target Date 08/11/2022      PT LONG TERM GOAL #5   Title Pt will decrease TUG to below 20 seconds/decrease in order to demonstrate decreased fall risk.    Baseline 02/23/2022=28.27 sec with RW; 04/20/2022= 20.22 with SBQC; 9/7: 14.4 sec with RW, 18.5 sec with SBQC   Time 12    Period Weeks    Status MET   Target Date 05/18/22    PT LONG TERM GOAL #6  Title Pt will decrease TUG to 14 sec or below with SBQC in order to demonstrate decreased fall risk.   Baseline  9/7: 14.4 sec with RW, 18.5 sec with SBQC  Time 12   Period Weeks   Status NEW  Target Date 08/11/2022   PT LONG TERM GOAL #7  Title Patient will increase six minute walk test distance to >700 ft for improved gait ability and increased ease with community participation.  Baseline 9/7: completes 4 minutes and 444 ft with RW. Test discontinued at 4 minutes due to [place].   Time 12   Period Weeks   Status NEW  Target Date 08/11/2022              Plan -     Clinical Impression Statement Patient presents with excellent motivation for completion of physical therapy activities.   Patient was experiencing increased weakness in his lower extremities bilaterally this date as well as over the past few days.  As result patient's activities were somewhat limited this date.  Continue with lower extremity strengthening emphasized importance as well as options for continuing lower extremity strengthening even on days where he is experiencing increased weakness by performing seated isometric exercises and patient verbalized demonstrated understanding.Pt will continue to benefit from skilled physical therapy intervention to address impairments, improve QOL, and attain therapy goals.     Personal Factors and Comorbidities Age;Past/Current Experience;Time since onset of injury/illness/exacerbation;Comorbidity 3+    Comorbidities COPD, History of Cancer, myasthenia gravis, chronic lumbar surgical history    Examination-Activity Limitations Lift;Squat;Bend;Stairs;Stand;Transfers;Insurance claims handler;Bathing;Hygiene/Grooming;Dressing;Toileting;Bed Mobility;Caring for Others;Continence    Examination-Participation Restrictions Yard Work;Church;Cleaning;Driving;Community Activity    Stability/Clinical Decision Making Evolving/Moderate complexity    Rehab Potential Good    Clinical Impairments Affecting Rehab Potential multipe comorbidities    PT Frequency 2x / week    PT Duration 12 weeks    PT Treatment/Interventions ADLs/Self Care Home Management;Electrical Stimulation;Moist Heat;Traction;Ultrasound;Gait training;Stair training;Functional mobility training;Therapeutic activities;Therapeutic exercise;Balance training;Neuromuscular re-education;Patient/family education;Manual techniques;Passive range of motion;Dry needling;Joint Manipulations;Cryotherapy;Vestibular;Canalith Repostioning  PT Next Visit Plan standing strengthening and balance, seated strengthening and coordination    PT Home Exercise Plan Progress LE strengthen and functional endurance, continue plan   Consulted  and Agree with Plan of Care Patient            Particia Lather PT  06/14/22, 3:34 PM

## 2022-06-15 ENCOUNTER — Other Ambulatory Visit (INDEPENDENT_AMBULATORY_CARE_PROVIDER_SITE_OTHER): Payer: Self-pay | Admitting: Nurse Practitioner

## 2022-06-15 DIAGNOSIS — I6523 Occlusion and stenosis of bilateral carotid arteries: Secondary | ICD-10-CM

## 2022-06-16 ENCOUNTER — Ambulatory Visit: Payer: Medicare Other

## 2022-06-17 ENCOUNTER — Ambulatory Visit (INDEPENDENT_AMBULATORY_CARE_PROVIDER_SITE_OTHER): Payer: Medicare Other | Admitting: Vascular Surgery

## 2022-06-17 ENCOUNTER — Encounter (INDEPENDENT_AMBULATORY_CARE_PROVIDER_SITE_OTHER): Payer: Self-pay | Admitting: Vascular Surgery

## 2022-06-17 ENCOUNTER — Ambulatory Visit (INDEPENDENT_AMBULATORY_CARE_PROVIDER_SITE_OTHER): Payer: Medicare Other

## 2022-06-17 VITALS — BP 134/70 | HR 73 | Resp 16 | Wt 238.8 lb

## 2022-06-17 DIAGNOSIS — I6523 Occlusion and stenosis of bilateral carotid arteries: Secondary | ICD-10-CM

## 2022-06-17 DIAGNOSIS — I10 Essential (primary) hypertension: Secondary | ICD-10-CM

## 2022-06-17 DIAGNOSIS — G7000 Myasthenia gravis without (acute) exacerbation: Secondary | ICD-10-CM | POA: Diagnosis not present

## 2022-06-17 DIAGNOSIS — E7800 Pure hypercholesterolemia, unspecified: Secondary | ICD-10-CM

## 2022-06-17 DIAGNOSIS — J439 Emphysema, unspecified: Secondary | ICD-10-CM

## 2022-06-17 NOTE — Progress Notes (Signed)
MRN : [id number]  Francisco [name] is a 76 y.o. (1946-02-28) male who presents with chief complaint of  Chief Complaint  Patient presents with   Follow-up    Ultrasound follow up  .  History of Present Illness: Patient returns in follow-up of his carotid disease.  He is doing well today.  He denies any focal neurologic symptoms.  He has had a recent skin cancer removal from his left cheek and has the bandage on today.  He really is debilitated after what sounds like difficulty with a spine surgery last year.  He is working on getting rehabilitation for this.  His kidney function appears to have diminished and his most recent creatinine clearance is less than 30 now.  His duplex today shows a widely patent right carotid artery stent with stable 40 to 59% left ICA stenosis.  Current Outpatient Medications  Medication Sig Dispense Refill   apixaban (ELIQUIS) 5 MG TABS tablet Take 1 tablet (5 mg total) by mouth 2 (two) times daily. Restart 05/08/2021 60 tablet    atorvastatin (LIPITOR) 10 MG tablet TAKE 1 TABLET(10 MG) BY MOUTH DAILY 30 tablet 10   azaTHIOprine (IMURAN) 50 MG tablet Take 150 mg by mouth daily.      carbidopa-levodopa (SINEMET IR) 25-100 MG tablet Take 1 tablet by mouth 3 (three) times daily.     Cholecalciferol (D3-1000) 25 MCG (1000 UT) capsule Take 1,000 Units by mouth daily.     clopidogrel (PLAVIX) 75 MG tablet Take 1 tablet (75 mg total) by mouth daily at 6 (six) AM. Restart 05/08/2021 30 tablet 10   HYDROcodone-acetaminophen (NORCO) 10-325 MG tablet Take 1-2 tablets by mouth See admin instructions. Take 1 to 2 tablets every morning, may take 1 tablet every 6 hours as needed for pain     ipratropium-albuterol (DUONEB) 0.5-2.5 (3) MG/3ML SOLN Take 3 mLs by nebulization every 4 (four) hours as needed.     levothyroxine (SYNTHROID) 50 MCG tablet Take 1 tablet (50 mcg total) by mouth daily at 6 (six) AM. 30 tablet 0   metoprolol tartrate (LOPRESSOR) 25 MG tablet Take 12.5 mg  by mouth 2 (two) times daily as needed. For racing heart.     predniSONE (DELTASONE) 10 MG tablet Take 20 mg by mouth every other day. Taking 20 mg Monday, Wed., and Friday     spironolactone (ALDACTONE) 25 MG tablet Take 25 mg by mouth daily.     torsemide (DEMADEX) 20 MG tablet Take 20 mg by mouth daily.     traZODone (DESYREL) 50 MG tablet Take 0.5 tablets (25 mg total) by mouth at bedtime as needed for sleep. (Patient taking differently: Take 50 mg by mouth at bedtime.) 15 tablet 0   desmopressin (DDAVP) 0.2 MG tablet Take 200 mcg by mouth at bedtime. (Patient not taking: Reported on 12/30/2021)     vitamin B-12 (CYANOCOBALAMIN) 1000 MCG tablet Take 1,000 mcg by mouth daily. (Patient not taking: Reported on 11/29/2021)     No current facility-administered medications for this visit.    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   DDD (degenerative disc disease), cervical    Dysrhythmia    post carotid stent bradycardia; PAF 09/2020   GERD (gastroesophageal reflux disease)    Hypercholesterolemia    Hypertension    Hypothyroidism    pt takes Levothyroxine daily   Lumbosacral spinal stenosis    Myasthenia gravis, adult form ([place])    PAD (peripheral artery disease) (HCC)    Shortness of breath    Lung MD- Dr Darlin Coco   Sleep apnea    do not use CPAP every night    Past Surgical History:  Procedure Laterality Date   ANTERIOR CERVICAL DECOMP/DISCECTOMY FUSION  07/18/2011   Procedure: ANTERIOR CERVICAL  DECOMPRESSION/DISCECTOMY FUSION 2 LEVELS;  Surgeon: Cooper Render Pool;  Location: [facility];  Service: Neurosurgery;  Laterality: N/A;  cervical five-six, cervical six-seven anterior cervical discectomy and fusion   BACK SURGERY     in [place]     01/2020 Right, 04/2020 Left   CARDIAC CATHETERIZATION     2005 at [facility], no stents   CAROTID PTA/STENT INTERVENTION N/A 09/17/2020   Procedure: CAROTID PTA/STENT INTERVENTION;  Surgeon: Algernon Huxley, MD;  Location: [facility];  Service: Cardiovascular;  Laterality: N/A;   CATARACT EXTRACTION W/PHACO Left 01/06/2020   Procedure: CATARACT EXTRACTION PHACO AND INTRAOCULAR LENS PLACEMENT (IOC) ISTENT INJ LEFT 3.81  00:33.3;  Surgeon: Eulogio Bear, MD;  Location: [place];  Service: Ophthalmology;  Laterality: Left;   CATARACT EXTRACTION W/PHACO Right 02/03/2020   Procedure: CATARACT EXTRACTION PHACO AND INTRAOCULAR LENS PLACEMENT ([facility]) RIGHT ISTENT INJ;  Surgeon: Eulogio Bear, MD;  Location: [place];  Service: Ophthalmology;  Laterality: Right;  4.29 0:35.6   COLONOSCOPY     HERNIA REPAIR Left    inguinal hernia repair in 1985   LUMBAR LAMINECTOMY/DECOMPRESSION MICRODISCECTOMY Left 02/24/2014   Procedure: LUMBAR LAMINECTOMY/DECOMPRESSION MICRODISCECTOMY LUMBAR THREE-FOUR, FOUR-FIVE, LEFT FIVE-SACRAL ONE ;  Surgeon: Charlie Pitter, MD;  Location: [facility];  Service: Neurosurgery;  Laterality: Left;  LUMBAR LAMINECTOMY/DECOMPRESSION MICRODISCECTOMY LUMBAR THREE-FOUR, FOUR-FIVE, LEFT FIVE-SACRAL ONE    LUMBAR LAMINECTOMY/DECOMPRESSION MICRODISCECTOMY N/A 05/03/2021   Procedure: Laminectomy and Foraminotomy - L2-L3;  Surgeon: Earnie Larsson, MD;  Location: [place];  Service: Neurosurgery;  Laterality: N/A;  3C   POSTERIOR CERVICAL FUSION/FORAMINOTOMY N/A 08/07/2020   Procedure: C3-6 POSTERIOR FUSION WITH DECOMPRESSION;  Surgeon: Meade Maw, MD;  Location: [facility];  Service:  Neurosurgery;  Laterality: N/A;   PROSTATECTOMY  8/14   [facility] Dr Mare Ferrari      Social History   Tobacco Use   Smoking status: Former    Packs/day: 1.00    Years: 20.00    Total pack years: 20.00    Types: Cigarettes    Quit date: 09/12/2001    Years since quitting: 20.7   Smokeless tobacco: Never  Vaping Use   Vaping Use: Never used  Substance Use Topics   Alcohol use: Yes    Alcohol/week: 3.0 standard drinks of alcohol    Types: 3 Glasses of wine per week    Comment: 3 glasses a wine a week   Drug use: No      Family History  Problem Relation Age of Onset   Hypertension Mother    Stroke Mother  Stroke Father      Allergies  Allergen Reactions   Azithromycin Other (See Comments)    Avoid due to myasthenia gravis   Codeine Nausea And Vomiting    REVIEW OF SYSTEMS (Negative unless checked)   Constitutional: '[]'$ Weight loss  '[]'$ Fever  '[]'$ Chills Cardiac: '[]'$ Chest pain   '[]'$ Chest pressure   '[]'$ Palpitations   '[]'$ Shortness of breath when laying flat   '[]'$ Shortness of breath at rest   '[]'$ Shortness of breath with exertion. Vascular:  '[]'$ Pain in legs with walking   '[]'$ Pain in legs at rest   '[]'$ Pain in legs when laying flat   '[]'$ Claudication   '[]'$ Pain in feet when walking  '[]'$ Pain in feet at rest  '[]'$ Pain in feet when laying flat   '[]'$ History of DVT   '[]'$ Phlebitis   '[x]'$ Swelling in legs   '[]'$ Varicose veins   '[]'$ Non-healing ulcers Pulmonary:   '[]'$ Uses home oxygen   '[]'$ Productive cough   '[]'$ Hemoptysis   '[]'$ Wheeze  '[x]'$ COPD   '[]'$ Asthma Neurologic:  '[]'$ Dizziness  '[]'$ Blackouts   '[]'$ Seizures   '[]'$ History of stroke   '[]'$ History of TIA  '[]'$ Aphasia   '[]'$ Temporary blindness   '[]'$ Dysphagia   '[]'$ Weakness or numbness in arms   '[]'$ Weakness or numbness in legs Musculoskeletal:  '[x]'$ Arthritis   '[]'$ Joint swelling   '[x]'$ Joint pain   '[x]'$ Low back pain Hematologic:  '[]'$ Easy bruising  '[]'$ Easy bleeding   '[]'$ Hypercoagulable state   '[x]'$ Anemic  '[]'$ Hepatitis Gastrointestinal:  '[]'$ Blood in stool   '[]'$ Vomiting blood  '[]'$ Gastroesophageal  reflux/heartburn   '[]'$ Difficulty swallowing. Genitourinary:  '[x]'$ Chronic kidney disease   '[]'$ Difficult urination  '[]'$ Frequent urination  '[]'$ Burning with urination   '[]'$ Blood in urine Skin:  '[]'$ Rashes   '[]'$ Ulcers   '[]'$ Wounds Psychological:  '[]'$ History of anxiety   '[]'$  History of major depression.  Physical Examination  Vitals:   06/17/22 0932  BP: 134/70  Pulse: 73  Resp: 16  Weight: 238 lb 12.8 oz (108.3 kg)   Body mass index is 34.26 kg/m. Gen:  WD/WN, NAD Head: [place]/AT, No temporalis wasting. Ear/Nose/Throat: Hearing grossly intact, nares w/o erythema or drainage, trachea midline Eyes: Conjunctiva clear. Sclera non-icteric Neck: Supple.  No bruit  Pulmonary:  Good air movement, equal and clear to auscultation bilaterally.  Cardiac: RRR, No JVD Vascular:  Vessel Right Left  Radial Palpable Palpable       Musculoskeletal: M/S 5/5 throughout.  No deformity or atrophy.  Walks with a walker.  Mild lower extremity edema. Neurologic: CN 2-12 intact. Sensation grossly intact in extremities.  Symmetrical.  Speech is fluent. Motor exam as listed above. Psychiatric: Judgment intact, Mood & affect appropriate for pt's clinical situation. Dermatologic: No rashes or ulcers noted.  Has a bandage on his left cheek from a recent skin cancer removal    CBC Lab Results  Component Value Date   WBC 7.5 04/28/2022   HGB 12.4 (L) 04/28/2022   HCT 39.8 04/28/2022   MCV 109.9 (H) 04/28/2022   PLT 349 04/28/2022    BMET    Component Value Date/Time   NA 139 04/28/2022 1113   NA 141 03/04/2014 0450   K 5.2 (H) 04/28/2022 1113   K 3.9 03/04/2014 0450   CL 103 04/28/2022 1113   CL 104 03/04/2014 0450   CO2 26 04/28/2022 1113   CO2 28 03/04/2014 0450   GLUCOSE 156 (H) 04/28/2022 1113   GLUCOSE 100 (H) 03/04/2014 0450   BUN 38 (H) 04/28/2022 1113   BUN 25 (H) 03/04/2014 0450   CREATININE 2.46 (H) 04/28/2022 1113   CREATININE  0.99 03/04/2014 0450   CALCIUM 9.6 04/28/2022 1113   CALCIUM 9.0  03/04/2014 0450   GFRNONAA 26 (L) 04/28/2022 1113   GFRNONAA >60 03/04/2014 0450   GFRAA >60 03/04/2014 0450   CrCl cannot be calculated (Patient's most recent lab result is older than the maximum 21 days allowed.).  COAG Lab Results  Component Value Date   INR 1.4 (H) 04/28/2022   INR 1.2 12/30/2021   INR 0.9 07/30/2020    Radiology No results found.   Assessment/Plan COPD (chronic obstructive pulmonary disease) Followed by Pulmonology   Essential hypertension, benign blood pressure control important in reducing the progression of atherosclerotic disease. On appropriate oral medications.   Pure hypercholesterolemia lipid control important in reducing the progression of atherosclerotic disease. Continue statin therapy   Bilateral carotid artery disease (HCC) Duplex today shows his right carotid stent to be widely patent with 40 to 59% left ICA stenosis.  Doing well.  Continue current medical regimen.  Recheck in 12 months.   Leotis Pain, MD  06/17/2022 10:26 AM    This note was created with Dragon medical transcription system.  Any errors from dictation are purely unintentional

## 2022-06-21 ENCOUNTER — Ambulatory Visit: Payer: Medicare Other

## 2022-06-21 DIAGNOSIS — G959 Disease of spinal cord, unspecified: Secondary | ICD-10-CM

## 2022-06-21 DIAGNOSIS — R262 Difficulty in walking, not elsewhere classified: Secondary | ICD-10-CM

## 2022-06-21 DIAGNOSIS — R2689 Other abnormalities of gait and mobility: Secondary | ICD-10-CM

## 2022-06-21 DIAGNOSIS — R269 Unspecified abnormalities of gait and mobility: Secondary | ICD-10-CM

## 2022-06-21 DIAGNOSIS — R278 Other lack of coordination: Secondary | ICD-10-CM

## 2022-06-21 DIAGNOSIS — M6281 Muscle weakness (generalized): Secondary | ICD-10-CM

## 2022-06-21 DIAGNOSIS — R296 Repeated falls: Secondary | ICD-10-CM

## 2022-06-21 DIAGNOSIS — G8929 Other chronic pain: Secondary | ICD-10-CM

## 2022-06-21 DIAGNOSIS — R2681 Unsteadiness on feet: Secondary | ICD-10-CM

## 2022-06-21 NOTE — Therapy (Signed)
OUTPATIENT PHYSICAL THERAPY TREATMENT NOTE    Patient Name: Francisco [name] MRN: [id number] DOB:23-Feb-1946, 76 y.o., male Today's Date: 06/21/2022  PCP: Adin Hector, MD REFERRING PROVIDER: Meade Maw MD    PT End of Session - 06/21/22 1521     Visit Number 25    Number of Visits 47    Date for PT Re-Evaluation 08/11/22    Authorization Time Period -08/11/22    Progress Note Due on Visit 80    PT Start Time 1517    PT Stop Time 1559    PT Time Calculation (min) 42 min    Equipment Utilized During Treatment Gait belt    Activity Tolerance Patient tolerated treatment well    Behavior During Therapy WFL for tasks assessed/performed                          Past Medical History:  Diagnosis Date   Arthritis    lower left hip   Atypical angina    Bilateral hand numbness    from back surgery   Bronchitis, chronic (HCC)    Cancer ([place])    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; Merkel cell cancer, and Basal cell cancer (twice; 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   Carotid stenosis    CHF (congestive heart failure) (HCC)    CKD (chronic kidney disease)    CKD (chronic kidney disease) stage 3, GFR 30-59 ml/min (HCC)    COPD (chronic obstructive pulmonary disease) ([place])    stage 2   DDD (degenerative disc disease), cervical    Dysrhythmia    post carotid stent bradycardia; PAF 09/2020   GERD (gastroesophageal reflux disease)    Hypercholesterolemia    Hypertension    Hypothyroidism    pt takes Levothyroxine daily   Lumbosacral spinal stenosis    Myasthenia gravis, adult form ([place])    PAD (peripheral artery disease) ([place])    Shortness of breath    Lung MD- Dr Darlin Coco   Sleep apnea    do not use CPAP every night   Past Surgical History:  Procedure Laterality Date   ANTERIOR CERVICAL DECOMP/DISCECTOMY FUSION  07/18/2011   Procedure: ANTERIOR CERVICAL DECOMPRESSION/DISCECTOMY FUSION 2 LEVELS;  Surgeon: Cooper Render Pool;  Location: [facility];  Service:  Neurosurgery;  Laterality: N/A;  cervical five-six, cervical six-seven anterior cervical discectomy and fusion   BACK SURGERY     in [place]     01/2020 Right, 04/2020 Left   CARDIAC CATHETERIZATION     2005 at [facility], no stents   CAROTID PTA/STENT INTERVENTION N/A 09/17/2020   Procedure: CAROTID PTA/STENT INTERVENTION;  Surgeon: Algernon Huxley, MD;  Location: [facility];  Service: Cardiovascular;  Laterality: N/A;   CATARACT EXTRACTION W/PHACO Left 01/06/2020   Procedure: CATARACT EXTRACTION PHACO AND INTRAOCULAR LENS PLACEMENT (IOC) ISTENT INJ LEFT 3.81  00:33.3;  Surgeon: Eulogio Bear, MD;  Location: [facility];  Service: Ophthalmology;  Laterality: Left;   CATARACT EXTRACTION W/PHACO Right 02/03/2020   Procedure: CATARACT EXTRACTION PHACO AND INTRAOCULAR LENS PLACEMENT ([place]) RIGHT ISTENT INJ;  Surgeon: Eulogio Bear, MD;  Location: [facility];  Service: Ophthalmology;  Laterality: Right;  4.29 0:35.6   COLONOSCOPY     HERNIA REPAIR Left    inguinal hernia repair in [place] MICRODISCECTOMY Left 02/24/2014   Procedure: LUMBAR LAMINECTOMY/DECOMPRESSION MICRODISCECTOMY LUMBAR THREE-FOUR, FOUR-FIVE, LEFT FIVE-SACRAL ONE ;  Surgeon: Charlie Pitter, MD;  Location: [facility];  Service: Neurosurgery;  Laterality: Left;  LUMBAR LAMINECTOMY/DECOMPRESSION MICRODISCECTOMY LUMBAR THREE-FOUR, FOUR-FIVE, LEFT FIVE-SACRAL ONE    LUMBAR LAMINECTOMY/DECOMPRESSION MICRODISCECTOMY N/A 05/03/2021   Procedure: Laminectomy and Foraminotomy - L2-L3;  Surgeon: Earnie Larsson, MD;  Location: [facility];  Service: Neurosurgery;  Laterality: N/A;  3C   POSTERIOR CERVICAL FUSION/FORAMINOTOMY N/A 08/07/2020   Procedure: C3-6 POSTERIOR FUSION WITH DECOMPRESSION;  Surgeon: Meade Maw, MD;  Location: [facility];  Service: Neurosurgery;  Laterality: N/A;   PROSTATECTOMY  8/14   [facility] Dr Mare Ferrari    Patient Active  Problem List   Diagnosis Date Noted   SVT (supraventricular tachycardia) 12/30/2021   Hypothyroidism 12/30/2021   Parkinson's disease 12/30/2021   Elevated troponin 12/30/2021   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   Acquired thrombophilia ([place]) 01/13/2021   History of decompression of median nerve 01/01/2021   Paroxysmal atrial fibrillation ([place]) 10/06/2020   Carotid stenosis, right 09/17/2020   S/P cervical spinal fusion    Leukocytosis    Essential hypertension    Anemia of chronic disease    Postoperative pain    Neuropathic pain    Cervical myelopathy ([place]) 08/07/2020   Preop cardiovascular exam 07/17/2020   SOB (shortness of breath) on exertion 07/17/2020   Leg weakness, bilateral 07/13/2020   Aortic atherosclerosis ([place]) 07/09/2020   Body mass index (BMI) 34.0-34.9, adult 12/25/2019   Myalgia 10/30/2019   Lumbar post-laminectomy syndrome 10/24/2019   PAD (peripheral artery disease) ([place]) 06/06/2019   CKD (chronic kidney disease) stage 3, GFR 30-59 ml/min (HCC) 04/29/2019   B12 deficiency 01/23/2019   Left arm numbness 02/28/2018   Neck pain 02/28/2018   Anemia 02/02/2017   DDD (degenerative disc disease), cervical 02/02/2017   Hypothyroid 02/02/2017   MRSA (methicillin resistant staph aureus) culture positive 02/02/2017   Nocturnal hypoxia 02/02/2017   Senile purpura ([place]) 10/03/2016   Essential hypertension, benign 09/16/2016   Bilateral carotid artery disease ([place]) 09/16/2016   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/18/2016   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   History of prostate cancer 12/21/2015   Left carpal tunnel syndrome 11/11/2015   Kidney stone on left side 07/05/2015   Myasthenia gravis ([place]) 05/26/2015   Radiculitis 01/05/2015   Long-term use of high-risk medication 10/15/2014   Persistent cough 09/10/2014   Pure hypercholesterolemia 07/25/2014   Spinal stenosis, lumbar region, with neurogenic claudication 02/24/2014    Lumbosacral stenosis with neurogenic claudication 02/24/2014   COPD (chronic obstructive pulmonary disease) ([place]) 01/22/2014    REFERRING DIAG: balance disorder   THERAPY DIAG:  Difficulty in walking, not elsewhere classified  Muscle weakness (generalized)  Unsteadiness on feet  Other abnormalities of gait and mobility  Other lack of coordination  Chronic bilateral low back pain, unspecified whether sciatica present  Repeated falls  Cervical myelopathy (HCC)  Abnormality of gait and mobility  Rationale for Evaluation and Treatment Rehabilitation  PERTINENT HISTORY: Kalif Kattner is a 76 year old male referred to [facility] neuro for difficulty with balance. He was also just recently dx in May 2023 with Impingement syndrome of left shoulder (M75.42) Impingement syndrome of left shoulder (primary encounter diagnosis) Left shoulder pain, unspecified chronicity Nontraumatic rupture of left proximal biceps tendon Past medical hx includes Lumbar laminectomy 04/2021. C3-6 decompression, fusion on 11/26, myasthenia gravis, 2012 ACDF 5/6, 6/7; OSA, hypothyroidism, HTN, COPD, multiple back surgeries, 2021 carpal tunnel release.  PRECAUTIONS: Fall  SUBJECTIVE: Pt reports having a pretty good week so  far and going to have sutures removed from left jaw later this week (had tumor removed).   PAIN:  Are you having pain? No     TODAY'S TREATMENT: 06/21/22     Therex:   Nustep (interval Training)- L1 x 1 min and L3 x 1 min- Repeat for total of 5 min   Sit to stand while holding onto PVC pipe with outstretched arms 2 sets of 5 reps (Patient fatigued after reps requiring 2 sets to complete today)   Forward step up/over 3 obstacles in // bars (1/2 foam and 2 orange hurdles) - down and back x 3times x 2 sets (patient reports left LE feeling really tired)   Standing hip circles (swing LE around yoga block placed vertically in front on one leg) x 12 reps CW each LE then repeat CCW x 12  reps  Standing step tap onto 2" block without UE x 15 reps each side ( VC to slow down and focus more on Single limb stance)   Standing Hip abd with matrix cable system 2.5 # BLE x 10 reps (Patient with minimal Hip activity and report of increased fatigue).   Standing calf press x 12 reps with BUE Support (Patient very fatigued at end of session and stated his legs were "done.")      Patient required rest breaks due fatigue, PT was quick to ask when pt appeared to be fatiguing in order to prevent excessive fatigue.   PATIENT EDUCATION: Education details: Goals, plan, exercise technique Person educated: Patient Education method: Explanation, Demonstration, Tactile cues, Verbal cues, and Handouts Education comprehension: verbalized understanding, returned demonstration, verbal cues required, and tactile cues required   HOME EXERCISE PROGRAM:  No Updates today  Access Code: [email or web address] URL: [email or web address] Date: 03/01/2022 Prepared by: Janna Arch  Exercises - Seated March  - 1 x daily - 7 x weekly - 2 sets - 10 reps - 5 hold - Seated Long Arc Quad  - 1 x daily - 7 x weekly - 2 sets - 10 reps - 5 hold - Seated Hip Abduction  - 1 x daily - 7 x weekly - 2 sets - 10 reps - 5 hold - Seated Hip Adduction Isometrics with Ball  - 1 x daily - 7 x weekly - 2 sets - 10 reps - 5 hold - Seated Gluteal Sets  - 1 x daily - 7 x weekly - 2 sets - 10 reps - 5 hold - Seated Heel Toe Raises  - 1 x daily - 7 x weekly - 2 sets - 10 reps - 5 hold LE strength and balance         PT Short Term Goals       PT SHORT TERM GOAL #1   Title Pt will be independent with initial HEP in order to improve strength and balance in order to decrease fall risk and improve function at home and work.    Baseline 02/23/2022- Patient reports not doing much as far as exercise in the home currently. 04/20/2022- Patient reports compliant with HEP and no questions at this time.; 9/7 pt indep   Time 6     Period Weeks    Status Goal met   Target Date 04/06/22              PT Long Term Goals       PT LONG TERM GOAL #1   Title Pt will be independent with final HEP in order to improve strength and  balance in order to decrease fall risk and improve function at home and work. 04/20/2022- Patient reports compliant with HEP and no questions at this time.    Baseline 02/23/2022= No formal HEP in place. 9/7: pt performing at least once a day, feels comfortable   Time 12    Period Weeks    Status GOAL MET   Target Date 08/11/2022      PT LONG TERM GOAL #2   Title Pt will decrease 5TSTS by at least 8 seconds in order to demonstrate clinically significant improvement in LE strength.    Baseline 02/23/2022= 31.52 sec with B hands on knees; 04/20/2022= 16.08 sec with B hands on knees   Time 12    Period Days    Status Goal Met   Target Date 05/18/22      PT LONG TERM GOAL #3   Title Pt will increase 10MWT by at least 0.23 m/s in order to demonstrate clinically significant improvement in community ambulation.    Baseline 02/23/2022= 0.39 m/s using RW; 0.45 m/s; 9/7: 0.37 m/s with QC, 0.74 m/s with RW   Time 12    Period Weeks    Status GOAL MET   Target Date 05/18/22      PT LONG TERM GOAL #4   Title Pt will improve FOTO to target score of 50 to display perceived improvements in ability to complete ADL's.    Baseline 02/23/2022= 47; 9/7: 49   Time 12    Period Weeks    Status Partially met     Target Date 08/11/2022      PT LONG TERM GOAL #5   Title Pt will decrease TUG to below 20 seconds/decrease in order to demonstrate decreased fall risk.    Baseline 02/23/2022=28.27 sec with RW; 04/20/2022= 20.22 with SBQC; 9/7: 14.4 sec with RW, 18.5 sec with SBQC   Time 12    Period Weeks    Status MET   Target Date 05/18/22    PT LONG TERM GOAL #6  Title Pt will decrease TUG to 14 sec or below with SBQC in order to demonstrate decreased fall risk.   Baseline  9/7: 14.4 sec with RW, 18.5 sec  with SBQC  Time 12   Period Weeks   Status NEW  Target Date 08/11/2022   PT LONG TERM GOAL #7  Title Patient will increase six minute walk test distance to >700 ft for improved gait ability and increased ease with community participation.  Baseline 9/7: completes 4 minutes and 444 ft with RW. Test discontinued at 4 minutes due to [place].   Time 12   Period Weeks   Status NEW  Target Date 08/11/2022              Plan -     Clinical Impression Statement Patient presented with improved overall strengthen and stamina with exercises well vs. Previous sessions. He required brief rest breaks yet able to complete all standing activities well today and no report of pain and no LOB. Pt will continue to benefit from skilled physical therapy intervention to address impairments, improve QOL, and attain therapy goals.     Personal Factors and Comorbidities Age;Past/Current Experience;Time since onset of injury/illness/exacerbation;Comorbidity 3+    Comorbidities COPD, History of Cancer, myasthenia gravis, chronic lumbar surgical history    Examination-Activity Limitations Lift;Squat;Bend;Stairs;Stand;Transfers;Insurance claims handler;Bathing;Hygiene/Grooming;Dressing;Toileting;Bed Mobility;Caring for Others;Continence    Examination-Participation Restrictions Yard Work;Church;Cleaning;Driving;Community Activity    Stability/Clinical Decision Making Evolving/Moderate complexity    Rehab Potential Good  Clinical Impairments Affecting Rehab Potential multipe comorbidities    PT Frequency 2x / week    PT Duration 12 weeks    PT Treatment/Interventions ADLs/Self Care Home Management;Electrical Stimulation;Moist Heat;Traction;Ultrasound;Gait training;Stair training;Functional mobility training;Therapeutic activities;Therapeutic exercise;Balance training;Neuromuscular re-education;Patient/family education;Manual techniques;Passive range of motion;Dry needling;Joint  Manipulations;Cryotherapy;Vestibular;Canalith Repostioning    PT Next Visit Plan standing strengthening and balance, seated strengthening and coordination    PT Home Exercise Plan Progress LE strengthen and functional endurance, continue plan   Consulted and Agree with Plan of Care Patient            Lewis Moccasin PT  06/21/22, 4:45 PM

## 2022-06-23 ENCOUNTER — Ambulatory Visit: Payer: Medicare Other

## 2022-06-23 DIAGNOSIS — R2689 Other abnormalities of gait and mobility: Secondary | ICD-10-CM

## 2022-06-23 DIAGNOSIS — R262 Difficulty in walking, not elsewhere classified: Secondary | ICD-10-CM

## 2022-06-23 DIAGNOSIS — R2681 Unsteadiness on feet: Secondary | ICD-10-CM

## 2022-06-23 DIAGNOSIS — M6281 Muscle weakness (generalized): Secondary | ICD-10-CM

## 2022-06-23 NOTE — Therapy (Signed)
OUTPATIENT PHYSICAL THERAPY TREATMENT NOTE    Patient Name: Francisco [name] MRN: [id number] DOB:02-18-1946, 76 y.o., male Today's Date: 06/23/2022  PCP: Adin Hector, MD REFERRING PROVIDER: Meade Maw MD    PT End of Session - 06/23/22 1515     Visit Number 26    Number of Visits 81    Date for PT Re-Evaluation 08/11/22    Authorization Time Period -08/11/22    Progress Note Due on Visit 67    PT Start Time 1515    PT Stop Time 1559    PT Time Calculation (min) 44 min    Equipment Utilized During Treatment Gait belt    Activity Tolerance Patient tolerated treatment well    Behavior During Therapy WFL for tasks assessed/performed                          Past Medical History:  Diagnosis Date   Arthritis    lower left hip   Atypical angina    Bilateral hand numbness    from back surgery   Bronchitis, chronic (HCC)    Cancer ([place])    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; Merkel cell cancer, and Basal cell cancer (twice; 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   Carotid stenosis    CHF (congestive heart failure) (HCC)    CKD (chronic kidney disease)    CKD (chronic kidney disease) stage 3, GFR 30-59 ml/min (HCC)    COPD (chronic obstructive pulmonary disease) ([place])    stage 2   DDD (degenerative disc disease), cervical    Dysrhythmia    post carotid stent bradycardia; PAF 09/2020   GERD (gastroesophageal reflux disease)    Hypercholesterolemia    Hypertension    Hypothyroidism    pt takes Levothyroxine daily   Lumbosacral spinal stenosis    Myasthenia gravis, adult form ([place])    PAD (peripheral artery disease) ([place])    Shortness of breath    Lung MD- Dr Darlin Coco   Sleep apnea    do not use CPAP every night   Past Surgical History:  Procedure Laterality Date   ANTERIOR CERVICAL DECOMP/DISCECTOMY FUSION  07/18/2011   Procedure: ANTERIOR CERVICAL DECOMPRESSION/DISCECTOMY FUSION 2 LEVELS;  Surgeon: Cooper Render Pool;  Location: [facility];  Service:  Neurosurgery;  Laterality: N/A;  cervical five-six, cervical six-seven anterior cervical discectomy and fusion   BACK SURGERY     in [place]     01/2020 Right, 04/2020 Left   CARDIAC CATHETERIZATION     2005 at [facility], no stents   CAROTID PTA/STENT INTERVENTION N/A 09/17/2020   Procedure: CAROTID PTA/STENT INTERVENTION;  Surgeon: Algernon Huxley, MD;  Location: [facility];  Service: Cardiovascular;  Laterality: N/A;   CATARACT EXTRACTION W/PHACO Left 01/06/2020   Procedure: CATARACT EXTRACTION PHACO AND INTRAOCULAR LENS PLACEMENT (IOC) ISTENT INJ LEFT 3.81  00:33.3;  Surgeon: Eulogio Bear, MD;  Location: [facility];  Service: Ophthalmology;  Laterality: Left;   CATARACT EXTRACTION W/PHACO Right 02/03/2020   Procedure: CATARACT EXTRACTION PHACO AND INTRAOCULAR LENS PLACEMENT ([place]) RIGHT ISTENT INJ;  Surgeon: Eulogio Bear, MD;  Location: [facility];  Service: Ophthalmology;  Laterality: Right;  4.29 0:35.6   COLONOSCOPY     HERNIA REPAIR Left    inguinal hernia repair in [place] MICRODISCECTOMY Left 02/24/2014   Procedure: LUMBAR LAMINECTOMY/DECOMPRESSION MICRODISCECTOMY LUMBAR THREE-FOUR, FOUR-FIVE, LEFT FIVE-SACRAL ONE ;  Surgeon: Charlie Pitter, MD;  Location: [facility];  Service: Neurosurgery;  Laterality: Left;  LUMBAR LAMINECTOMY/DECOMPRESSION MICRODISCECTOMY LUMBAR THREE-FOUR, FOUR-FIVE, LEFT FIVE-SACRAL ONE    LUMBAR LAMINECTOMY/DECOMPRESSION MICRODISCECTOMY N/A 05/03/2021   Procedure: Laminectomy and Foraminotomy - L2-L3;  Surgeon: Earnie Larsson, MD;  Location: [facility];  Service: Neurosurgery;  Laterality: N/A;  3C   POSTERIOR CERVICAL FUSION/FORAMINOTOMY N/A 08/07/2020   Procedure: C3-6 POSTERIOR FUSION WITH DECOMPRESSION;  Surgeon: Meade Maw, MD;  Location: [facility];  Service: Neurosurgery;  Laterality: N/A;   PROSTATECTOMY  8/14   [facility] Dr Mare Ferrari    Patient Active  Problem List   Diagnosis Date Noted   SVT (supraventricular tachycardia) 12/30/2021   Hypothyroidism 12/30/2021   Parkinson's disease 12/30/2021   Elevated troponin 12/30/2021   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   Acquired thrombophilia ([place]) 01/13/2021   History of decompression of median nerve 01/01/2021   Paroxysmal atrial fibrillation ([place]) 10/06/2020   Carotid stenosis, right 09/17/2020   S/P cervical spinal fusion    Leukocytosis    Essential hypertension    Anemia of chronic disease    Postoperative pain    Neuropathic pain    Cervical myelopathy ([place]) 08/07/2020   Preop cardiovascular exam 07/17/2020   SOB (shortness of breath) on exertion 07/17/2020   Leg weakness, bilateral 07/13/2020   Aortic atherosclerosis ([place]) 07/09/2020   Body mass index (BMI) 34.0-34.9, adult 12/25/2019   Myalgia 10/30/2019   Lumbar post-laminectomy syndrome 10/24/2019   PAD (peripheral artery disease) (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   History of prostate cancer 12/21/2015   Left carpal tunnel syndrome 11/11/2015   Kidney stone on left side 07/05/2015   Myasthenia gravis ([place]) 05/26/2015   Radiculitis 01/05/2015   Long-term use of high-risk medication 10/15/2014   Persistent cough 09/10/2014   Pure hypercholesterolemia 07/25/2014   Spinal stenosis, lumbar region, with neurogenic claudication 02/24/2014    Lumbosacral stenosis with neurogenic claudication 02/24/2014   COPD (chronic obstructive pulmonary disease) ([place]) 01/22/2014    REFERRING DIAG: balance disorder   THERAPY DIAG:  Difficulty in walking, not elsewhere classified  Muscle weakness (generalized)  Unsteadiness on feet  Other abnormalities of gait and mobility  Rationale for Evaluation and Treatment Rehabilitation  PERTINENT HISTORY: Francisco [name] is a 76 year old male referred to [facility] neuro for difficulty with balance. He was also just recently dx in May 2023 with Impingement syndrome of left shoulder (M75.42) Impingement syndrome of left shoulder (primary encounter diagnosis) Left shoulder pain, unspecified chronicity Nontraumatic rupture of left proximal biceps tendon Past medical hx includes Lumbar laminectomy 04/2021. C3-6 decompression, fusion on 11/26, myasthenia gravis, 2012 ACDF 5/6, 6/7; OSA, hypothyroidism, HTN, COPD, multiple back surgeries, 2021 carpal tunnel release.  PRECAUTIONS: Fall  SUBJECTIVE: Patient goes next week to get his heart ablations. Had soreness after his last session  PAIN:  Are you having pain? No     TODAY'S TREATMENT: 06/23/22 In // bars: Step over and  back half foam roller 10x each LE  2" step toe taps 10x each LE without UE support 2" step lateral toe taps 10x each LE no Ue support, L thigh fatigue Forward/backwards in // bars ; cues for cross body arm swing 6x length of // bars ; x 2 sets  Standing on airex pad: -PVC pipe chest press 10x, clockwise witches brew 10x, counterclockwise witches brew 10x   Seated cross body coordination UE/LE raises  Sit to stand PVC pipe raise 10x  Lateral step over hedgehog 8x each LE   Patient required rest breaks due fatigue, PT was quick to ask when pt appeared to be fatiguing in order to prevent excessive fatigue.   Car transfer at end of session with CGA   PATIENT EDUCATION: Education details: Goals, plan, exercise technique Person  educated: Patient Education method: Explanation, Demonstration, Tactile cues, Verbal cues, and Handouts Education comprehension: verbalized understanding, returned demonstration, verbal cues required, and tactile cues required   HOME EXERCISE PROGRAM:  No Updates today  Access Code: [email or web address] URL: [email or web address] Date: 03/01/2022 Prepared by: Janna Arch  Exercises - Seated March  - 1 x daily - 7 x weekly - 2 sets - 10 reps - 5 hold - Seated Long Arc Quad  - 1 x daily - 7 x weekly - 2 sets - 10 reps - 5 hold - Seated Hip Abduction  - 1 x daily - 7 x weekly - 2 sets - 10 reps - 5 hold - Seated Hip Adduction Isometrics with Ball  - 1 x daily - 7 x weekly - 2 sets - 10 reps - 5 hold - Seated Gluteal Sets  - 1 x daily - 7 x weekly - 2 sets - 10 reps - 5 hold - Seated Heel Toe Raises  - 1 x daily - 7 x weekly - 2 sets - 10 reps - 5 hold LE strength and balance         PT Short Term Goals       PT SHORT TERM GOAL #1   Title Pt will be independent with initial HEP in order to improve strength and balance in order to decrease fall risk and improve function at home and work.    Baseline 02/23/2022- Patient reports not doing much as far as exercise in the home currently. 04/20/2022- Patient reports compliant with HEP and no questions at this time.; 9/7 pt indep   Time 6    Period Weeks    Status Goal met   Target Date 04/06/22              PT Long Term Goals       PT LONG TERM GOAL #1   Title Pt will be independent with final HEP in order to improve strength and balance in order to decrease fall risk and improve function at home and work. 04/20/2022- Patient reports compliant with HEP and no questions at this time.    Baseline 02/23/2022= No formal HEP in place. 9/7: pt performing at least once a day, feels comfortable   Time 12    Period Weeks    Status GOAL MET   Target Date 08/11/2022      PT LONG TERM GOAL #2   Title Pt will decrease 5TSTS by at least 8  seconds in order to demonstrate clinically significant improvement in LE strength.    Baseline 02/23/2022= 31.52 sec with B hands on knees; 04/20/2022= 16.08 sec with B hands on knees  Time 12    Period Days    Status Goal Met   Target Date 05/18/22      PT LONG TERM GOAL #3   Title Pt will increase 10MWT by at least 0.23 m/s in order to demonstrate clinically significant improvement in community ambulation.    Baseline 02/23/2022= 0.39 m/s using RW; 0.45 m/s; 9/7: 0.37 m/s with QC, 0.74 m/s with RW   Time 12    Period Weeks    Status GOAL MET   Target Date 05/18/22      PT LONG TERM GOAL #4   Title Pt will improve FOTO to target score of 50 to display perceived improvements in ability to complete ADL's.    Baseline 02/23/2022= 47; 9/7: 49   Time 12    Period Weeks    Status Partially met     Target Date 08/11/2022      PT LONG TERM GOAL #5   Title Pt will decrease TUG to below 20 seconds/decrease in order to demonstrate decreased fall risk.    Baseline 02/23/2022=28.27 sec with RW; 04/20/2022= 20.22 with SBQC; 9/7: 14.4 sec with RW, 18.5 sec with SBQC   Time 12    Period Weeks    Status MET   Target Date 05/18/22    PT LONG TERM GOAL #6  Title Pt will decrease TUG to 14 sec or below with SBQC in order to demonstrate decreased fall risk.   Baseline  9/7: 14.4 sec with RW, 18.5 sec with SBQC  Time 12   Period Weeks   Status NEW  Target Date 08/11/2022   PT LONG TERM GOAL #7  Title Patient will increase six minute walk test distance to >700 ft for improved gait ability and increased ease with community participation.  Baseline 9/7: completes 4 minutes and 444 ft with RW. Test discontinued at 4 minutes due to [place].   Time 12   Period Weeks   Status NEW  Target Date 08/11/2022              Plan -     Clinical Impression Statement Patient introduced to cross body coordination tasks. He requires additional sets to increase coordination through repetition. Patient is  highly motivated for progression of care and demonstrates good stability with functional mobility this session. Pt will continue to benefit from skilled physical therapy intervention to address impairments, improve QOL, and attain therapy goals.     Personal Factors and Comorbidities Age;Past/Current Experience;Time since onset of injury/illness/exacerbation;Comorbidity 3+    Comorbidities COPD, History of Cancer, myasthenia gravis, chronic lumbar surgical history    Examination-Activity Limitations Lift;Squat;Bend;Stairs;Stand;Transfers;Insurance claims handler;Bathing;Hygiene/Grooming;Dressing;Toileting;Bed Mobility;Caring for Others;Continence    Examination-Participation Restrictions Yard Work;Church;Cleaning;Driving;Community Activity    Stability/Clinical Decision Making Evolving/Moderate complexity    Rehab Potential Good    Clinical Impairments Affecting Rehab Potential multipe comorbidities    PT Frequency 2x / week    PT Duration 12 weeks    PT Treatment/Interventions ADLs/Self Care Home Management;Electrical Stimulation;Moist Heat;Traction;Ultrasound;Gait training;Stair training;Functional mobility training;Therapeutic activities;Therapeutic exercise;Balance training;Neuromuscular re-education;Patient/family education;Manual techniques;Passive range of motion;Dry needling;Joint Manipulations;Cryotherapy;Vestibular;Canalith Repostioning    PT Next Visit Plan standing strengthening and balance, seated strengthening and coordination    PT Home Exercise Plan Progress LE strengthen and functional endurance, continue plan   Consulted and Agree with Plan of Care Patient            Janna Arch PT  06/23/22, 4:06 PM

## 2022-06-28 ENCOUNTER — Ambulatory Visit: Payer: Medicare Other

## 2022-06-28 DIAGNOSIS — R278 Other lack of coordination: Secondary | ICD-10-CM

## 2022-06-28 DIAGNOSIS — R296 Repeated falls: Secondary | ICD-10-CM

## 2022-06-28 DIAGNOSIS — M6281 Muscle weakness (generalized): Secondary | ICD-10-CM

## 2022-06-28 DIAGNOSIS — R2681 Unsteadiness on feet: Secondary | ICD-10-CM

## 2022-06-28 DIAGNOSIS — R262 Difficulty in walking, not elsewhere classified: Secondary | ICD-10-CM | POA: Diagnosis not present

## 2022-06-28 DIAGNOSIS — R269 Unspecified abnormalities of gait and mobility: Secondary | ICD-10-CM

## 2022-06-28 DIAGNOSIS — G8929 Other chronic pain: Secondary | ICD-10-CM

## 2022-06-28 DIAGNOSIS — R2689 Other abnormalities of gait and mobility: Secondary | ICD-10-CM

## 2022-06-28 DIAGNOSIS — G959 Disease of spinal cord, unspecified: Secondary | ICD-10-CM

## 2022-06-28 NOTE — Therapy (Signed)
OUTPATIENT PHYSICAL THERAPY TREATMENT NOTE    Patient Name: Francisco [name] MRN: [id number] DOB:October 11, 1945, 76 y.o., male Today's Date: 06/29/2022  PCP: Adin Hector, MD REFERRING PROVIDER: Meade Maw MD    PT End of Session - 06/28/22 1523     Visit Number 27    Number of Visits 41    Date for PT Re-Evaluation 08/11/22    Authorization Time Period -08/11/22    Progress Note Due on Visit 15    PT Start Time 1517    PT Stop Time 1556    PT Time Calculation (min) 39 min    Equipment Utilized During Treatment Gait belt    Activity Tolerance Patient tolerated treatment well    Behavior During Therapy WFL for tasks assessed/performed                          Past Medical History:  Diagnosis Date   Arthritis    lower left hip   Atypical angina    Bilateral hand numbness    from back surgery   Bronchitis, chronic (HCC)    Cancer ([place])    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; Merkel cell cancer, and Basal cell cancer (twice; 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   Carotid stenosis    CHF (congestive heart failure) (HCC)    CKD (chronic kidney disease)    CKD (chronic kidney disease) stage 3, GFR 30-59 ml/min (HCC)    COPD (chronic obstructive pulmonary disease) ([place])    stage 2   DDD (degenerative disc disease), cervical    Dysrhythmia    post carotid stent bradycardia; PAF 09/2020   GERD (gastroesophageal reflux disease)    Hypercholesterolemia    Hypertension    Hypothyroidism    pt takes Levothyroxine daily   Lumbosacral spinal stenosis    Myasthenia gravis, adult form ([place])    PAD (peripheral artery disease) ([place])    Shortness of breath    Lung MD- Dr Darlin Coco   Sleep apnea    do not use CPAP every night   Past Surgical History:  Procedure Laterality Date   ANTERIOR CERVICAL DECOMP/DISCECTOMY FUSION  07/18/2011   Procedure: ANTERIOR CERVICAL DECOMPRESSION/DISCECTOMY FUSION 2 LEVELS;  Surgeon: Cooper Render Pool;  Location: [facility];  Service:  Neurosurgery;  Laterality: N/A;  cervical five-six, cervical six-seven anterior cervical discectomy and fusion   BACK SURGERY     in [place]     01/2020 Right, 04/2020 Left   CARDIAC CATHETERIZATION     2005 at [facility], no stents   CAROTID PTA/STENT INTERVENTION N/A 09/17/2020   Procedure: CAROTID PTA/STENT INTERVENTION;  Surgeon: Algernon Huxley, MD;  Location: [facility];  Service: Cardiovascular;  Laterality: N/A;   CATARACT EXTRACTION W/PHACO Left 01/06/2020   Procedure: CATARACT EXTRACTION PHACO AND INTRAOCULAR LENS PLACEMENT (IOC) ISTENT INJ LEFT 3.81  00:33.3;  Surgeon: Eulogio Bear, MD;  Location: [facility];  Service: Ophthalmology;  Laterality: Left;   CATARACT EXTRACTION W/PHACO Right 02/03/2020   Procedure: CATARACT EXTRACTION PHACO AND INTRAOCULAR LENS PLACEMENT ([place]) RIGHT ISTENT INJ;  Surgeon: Eulogio Bear, MD;  Location: [facility];  Service: Ophthalmology;  Laterality: Right;  4.29 0:35.6   COLONOSCOPY     HERNIA REPAIR Left    inguinal hernia repair in [place] MICRODISCECTOMY Left 02/24/2014   Procedure: LUMBAR LAMINECTOMY/DECOMPRESSION MICRODISCECTOMY LUMBAR THREE-FOUR, FOUR-FIVE, LEFT FIVE-SACRAL ONE ;  Surgeon: Charlie Pitter, MD;  Location: [facility];  Service: Neurosurgery;  Laterality: Left;  LUMBAR LAMINECTOMY/DECOMPRESSION MICRODISCECTOMY LUMBAR THREE-FOUR, FOUR-FIVE, LEFT FIVE-SACRAL ONE    LUMBAR LAMINECTOMY/DECOMPRESSION MICRODISCECTOMY N/A 05/03/2021   Procedure: Laminectomy and Foraminotomy - L2-L3;  Surgeon: Earnie Larsson, MD;  Location: [facility];  Service: Neurosurgery;  Laterality: N/A;  3C   POSTERIOR CERVICAL FUSION/FORAMINOTOMY N/A 08/07/2020   Procedure: C3-6 POSTERIOR FUSION WITH DECOMPRESSION;  Surgeon: Meade Maw, MD;  Location: [facility];  Service: Neurosurgery;  Laterality: N/A;   PROSTATECTOMY  8/14   [facility] Dr Mare Ferrari    Patient Active  Problem List   Diagnosis Date Noted   SVT (supraventricular tachycardia) 12/30/2021   Hypothyroidism 12/30/2021   Parkinson's disease 12/30/2021   Elevated troponin 12/30/2021   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   Acquired thrombophilia ([place]) 01/13/2021   History of decompression of median nerve 01/01/2021   Paroxysmal atrial fibrillation ([place]) 10/06/2020   Carotid stenosis, right 09/17/2020   S/P cervical spinal fusion    Leukocytosis    Essential hypertension    Anemia of chronic disease    Postoperative pain    Neuropathic pain    Cervical myelopathy ([place]) 08/07/2020   Preop cardiovascular exam 07/17/2020   SOB (shortness of breath) on exertion 07/17/2020   Leg weakness, bilateral 07/13/2020   Aortic atherosclerosis ([place]) 07/09/2020   Body mass index (BMI) 34.0-34.9, adult 12/25/2019   Myalgia 10/30/2019   Lumbar post-laminectomy syndrome 10/24/2019   PAD (peripheral artery disease) ([place]) 06/06/2019   CKD (chronic kidney disease) stage 3, GFR 30-59 ml/min (HCC) 04/29/2019   B12 deficiency 01/23/2019   Left arm numbness 02/28/2018   Neck pain 02/28/2018   Anemia 02/02/2017   DDD (degenerative disc disease), cervical 02/02/2017   Hypothyroid 02/02/2017   MRSA (methicillin resistant staph aureus) culture positive 02/02/2017   Nocturnal hypoxia 02/02/2017   Senile purpura ([place]) 10/03/2016   Essential hypertension, benign 09/16/2016   Bilateral carotid artery disease ([place]) 09/16/2016   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/18/2016   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   History of prostate cancer 12/21/2015   Left carpal tunnel syndrome 11/11/2015   Kidney stone on left side 07/05/2015   Myasthenia gravis ([place]) 05/26/2015   Radiculitis 01/05/2015   Long-term use of high-risk medication 10/15/2014   Persistent cough 09/10/2014   Pure hypercholesterolemia 07/25/2014   Spinal stenosis, lumbar region, with neurogenic claudication 02/24/2014    Lumbosacral stenosis with neurogenic claudication 02/24/2014   COPD (chronic obstructive pulmonary disease) ([place]) 01/22/2014    REFERRING DIAG: balance disorder   THERAPY DIAG:  Difficulty in walking, not elsewhere classified  Muscle weakness (generalized)  Unsteadiness on feet  Other abnormalities of gait and mobility  Other lack of coordination  Chronic bilateral low back pain, unspecified whether sciatica present  Repeated falls  Cervical myelopathy (HCC)  Abnormality of gait and mobility  Chronic left-sided low back pain with left-sided sciatica  Rationale for Evaluation and Treatment Rehabilitation  PERTINENT HISTORY: Domanik [place] is a 76 year old male referred to O[facility] neuro for difficulty with balance. He was also just recently dx in May 2023 with Impingement syndrome of left shoulder (M75.42) Impingement syndrome of left shoulder (primary encounter diagnosis) Left shoulder pain, unspecified chronicity Nontraumatic rupture of left proximal biceps tendon Past medical hx includes Lumbar laminectomy 04/2021. C3-6 decompression, fusion on 11/26, myasthenia gravis, 2012 ACDF 5/6, 6/7; OSA, hypothyroidism, HTN, COPD, multiple back surgeries, 2021 carpal tunnel release.  PRECAUTIONS: Fall  SUBJECTIVE: Patient goes next week to get his heart ablations. Had soreness after his last session  PAIN:  Are you having pain? No     TODAY'S TREATMENT: 06/28/2022  THEREX:   INTERVAL NuStep- BUE/LE level flucuating between level 1-4 for 6 min total for ROM/strengthening and cardiovascular function.      In // bars: Step over and back half foam roller 10x each LE  2" step toe taps 10x each LE without UE support 2" step lateral toe taps 10x each LE no Ue support, L thigh fatigue Forward/backwards in // bars ; cues for cross body arm swing 6x length of // bars ; x 2 sets  Standing on airex pad:  Forward/retro gait in // bars with 3# AW - Counting how many steps required to  go to end of // bars- (9) each time without UE and (6) retro with BUE support Forward walking with 180 turn in // bars (down to end and back) - timed required = 17.4 sec wearing 3# AW. Added cognitive dual task (count backward by 3) - time was 17.0 sec    Seated hip march alt LE x 12 reps with 3# AW LAQ Alt LE 3# AW x 12 reps each    Patient required rest breaks due fatigue, PT was quick to ask when pt appeared to be fatiguing in order to prevent excessive fatigue.   Car transfer at end of session with CGA   PATIENT EDUCATION: Education details: Goals, plan, exercise technique Person educated: Patient Education method: Explanation, Demonstration, Tactile cues, Verbal cues, and Handouts Education comprehension: verbalized understanding, returned demonstration, verbal cues required, and tactile cues required   HOME EXERCISE PROGRAM:  No Updates today  Access Code: [email or web address] URL: [email or web address] Date: 03/01/2022 Prepared by: Janna Arch  Exercises - Seated March  - 1 x daily - 7 x weekly - 2 sets - 10 reps - 5 hold - Seated Long Arc Quad  - 1 x daily - 7 x weekly - 2 sets - 10 reps - 5 hold - Seated Hip Abduction  - 1 x daily - 7 x weekly - 2 sets - 10 reps - 5 hold - Seated Hip Adduction Isometrics with Ball  - 1 x daily - 7 x weekly - 2 sets - 10 reps - 5 hold - Seated Gluteal Sets  - 1 x daily - 7 x weekly - 2 sets - 10 reps - 5 hold - Seated Heel Toe Raises  - 1 x daily - 7 x weekly - 2 sets - 10 reps - 5 hold LE strength and balance         PT Short Term Goals       PT SHORT TERM GOAL #1   Title Pt will be independent with initial HEP in order to improve strength and balance in order to decrease fall risk and improve function at home and work.    Baseline 02/23/2022- Patient reports not doing much as far as exercise in the home currently. 04/20/2022- Patient reports compliant with HEP and no questions at this time.; 9/7 pt indep   Time 6    Period  Weeks    Status Goal met   Target Date 04/06/22              PT Long Term Goals       PT LONG TERM GOAL #1   Title Pt will be independent with final HEP in order to improve strength and balance in  order to decrease fall risk and improve function at home and work. 04/20/2022- Patient reports compliant with HEP and no questions at this time.    Baseline 02/23/2022= No formal HEP in place. 9/7: pt performing at least once a day, feels comfortable   Time 12    Period Weeks    Status GOAL MET   Target Date 08/11/2022      PT LONG TERM GOAL #2   Title Pt will decrease 5TSTS by at least 8 seconds in order to demonstrate clinically significant improvement in LE strength.    Baseline 02/23/2022= 31.52 sec with B hands on knees; 04/20/2022= 16.08 sec with B hands on knees   Time 12    Period Days    Status Goal Met   Target Date 05/18/22      PT LONG TERM GOAL #3   Title Pt will increase 10MWT by at least 0.23 m/s in order to demonstrate clinically significant improvement in community ambulation.    Baseline 02/23/2022= 0.39 m/s using RW; 0.45 m/s; 9/7: 0.37 m/s with QC, 0.74 m/s with RW   Time 12    Period Weeks    Status GOAL MET   Target Date 05/18/22      PT LONG TERM GOAL #4   Title Pt will improve FOTO to target score of 50 to display perceived improvements in ability to complete ADL's.    Baseline 02/23/2022= 47; 9/7: 49   Time 12    Period Weeks    Status Partially met     Target Date 08/11/2022      PT LONG TERM GOAL #5   Title Pt will decrease TUG to below 20 seconds/decrease in order to demonstrate decreased fall risk.    Baseline 02/23/2022=28.27 sec with RW; 04/20/2022= 20.22 with SBQC; 9/7: 14.4 sec with RW, 18.5 sec with SBQC   Time 12    Period Weeks    Status MET   Target Date 05/18/22    PT LONG TERM GOAL #6  Title Pt will decrease TUG to 14 sec or below with SBQC in order to demonstrate decreased fall risk.   Baseline  9/7: 14.4 sec with RW, 18.5 sec with SBQC   Time 12   Period Weeks   Status NEW  Target Date 08/11/2022   PT LONG TERM GOAL #7  Title Patient will increase six minute walk test distance to >700 ft for improved gait ability and increased ease with community participation.  Baseline 9/7: completes 4 minutes and 444 ft with RW. Test discontinued at 4 minutes due to [place].   Time 12   Period Weeks   Status NEW  Target Date 08/11/2022              Plan -     Clinical Impression Statement Patient presents with good overall motivation for today's activities performing all tasks- obvious weakness as continued limiting factor. He was able to increase his cadence with VC today. He was challenged with dual task activities but improved with practice.  Pt will continue to benefit from skilled physical therapy intervention to address impairments, improve QOL, and attain therapy goals.     Personal Factors and Comorbidities Age;Past/Current Experience;Time since onset of injury/illness/exacerbation;Comorbidity 3+    Comorbidities COPD, History of Cancer, myasthenia gravis, chronic lumbar surgical history    Examination-Activity Limitations Lift;Squat;Bend;Stairs;Stand;Transfers;Insurance claims handler;Bathing;Hygiene/Grooming;Dressing;Toileting;Bed Mobility;Caring for Others;Continence    Examination-Participation Restrictions Yard Work;Church;Cleaning;Driving;Community Activity    Stability/Clinical Decision Making Evolving/Moderate complexity    Rehab  Potential Good    Clinical Impairments Affecting Rehab Potential multipe comorbidities    PT Frequency 2x / week    PT Duration 12 weeks    PT Treatment/Interventions ADLs/Self Care Home Management;Electrical Stimulation;Moist Heat;Traction;Ultrasound;Gait training;Stair training;Functional mobility training;Therapeutic activities;Therapeutic exercise;Balance training;Neuromuscular re-education;Patient/family education;Manual techniques;Passive range of motion;Dry  needling;Joint Manipulations;Cryotherapy;Vestibular;Canalith Repostioning    PT Next Visit Plan standing strengthening and balance, seated strengthening and coordination    PT Home Exercise Plan Progress LE strengthen and functional endurance, continue plan   Consulted and Agree with Plan of Care Patient            Lewis Moccasin PT  06/29/22, 1:29 PM

## 2022-06-30 ENCOUNTER — Ambulatory Visit: Payer: Medicare Other

## 2022-07-05 ENCOUNTER — Encounter (INDEPENDENT_AMBULATORY_CARE_PROVIDER_SITE_OTHER): Payer: Medicare Other

## 2022-07-05 ENCOUNTER — Ambulatory Visit (INDEPENDENT_AMBULATORY_CARE_PROVIDER_SITE_OTHER): Payer: Medicare Other | Admitting: Vascular Surgery

## 2022-07-05 ENCOUNTER — Ambulatory Visit: Payer: Medicare Other

## 2022-07-07 ENCOUNTER — Ambulatory Visit: Payer: Medicare Other

## 2022-07-12 ENCOUNTER — Ambulatory Visit: Payer: Medicare Other

## 2022-07-12 DIAGNOSIS — R262 Difficulty in walking, not elsewhere classified: Secondary | ICD-10-CM | POA: Diagnosis not present

## 2022-07-12 DIAGNOSIS — R2681 Unsteadiness on feet: Secondary | ICD-10-CM

## 2022-07-12 DIAGNOSIS — M6281 Muscle weakness (generalized): Secondary | ICD-10-CM

## 2022-07-12 NOTE — Therapy (Signed)
OUTPATIENT PHYSICAL THERAPY TREATMENT NOTE    Patient Name: Francisco [name] MRN: [id number] DOB:08-Apr-1946, 76 y.o., male Today's Date: 07/12/2022  PCP: Adin Hector, MD REFERRING PROVIDER: Meade Maw MD    PT End of Session - 07/12/22 1515     Visit Number 28    Number of Visits 61    Date for PT Re-Evaluation 08/11/22    Authorization Time Period -08/11/22    Progress Note Due on Visit 38    PT Start Time 1515    PT Stop Time 1600    PT Time Calculation (min) 45 min    Equipment Utilized During Treatment Gait belt    Activity Tolerance Patient tolerated treatment well    Behavior During Therapy WFL for tasks assessed/performed                          Past Medical History:  Diagnosis Date   Arthritis    lower left hip   Atypical angina    Bilateral hand numbness    from back surgery   Bronchitis, chronic (HCC)    Cancer ([place])    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; Merkel cell cancer, and Basal cell cancer (twice; 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   Carotid stenosis    CHF (congestive heart failure) (HCC)    CKD (chronic kidney disease)    CKD (chronic kidney disease) stage 3, GFR 30-59 ml/min (HCC)    COPD (chronic obstructive pulmonary disease) ([place])    stage 2   DDD (degenerative disc disease), cervical    Dysrhythmia    post carotid stent bradycardia; PAF 09/2020   GERD (gastroesophageal reflux disease)    Hypercholesterolemia    Hypertension    Hypothyroidism    pt takes Levothyroxine daily   Lumbosacral spinal stenosis    Myasthenia gravis, adult form ([place])    PAD (peripheral artery disease) ([place])    Shortness of breath    Lung MD- Dr Darlin Coco   Sleep apnea    do not use CPAP every night   Past Surgical History:  Procedure Laterality Date   ANTERIOR CERVICAL DECOMP/DISCECTOMY FUSION  07/18/2011   Procedure: ANTERIOR CERVICAL DECOMPRESSION/DISCECTOMY FUSION 2 LEVELS;  Surgeon: Cooper Render Pool;  Location: [facility];  Service:  Neurosurgery;  Laterality: N/A;  cervical five-six, cervical six-seven anterior cervical discectomy and fusion   BACK SURGERY     in [place]     01/2020 Right, 04/2020 Left   CARDIAC CATHETERIZATION     2005 at [facility], no stents   CAROTID PTA/STENT INTERVENTION N/A 09/17/2020   Procedure: CAROTID PTA/STENT INTERVENTION;  Surgeon: Algernon Huxley, MD;  Location: [facility];  Service: Cardiovascular;  Laterality: N/A;   CATARACT EXTRACTION W/PHACO Left 01/06/2020   Procedure: CATARACT EXTRACTION PHACO AND INTRAOCULAR LENS PLACEMENT (IOC) ISTENT INJ LEFT 3.81  00:33.3;  Surgeon: Eulogio Bear, MD;  Location: [facility];  Service: Ophthalmology;  Laterality: Left;   CATARACT EXTRACTION W/PHACO Right 02/03/2020   Procedure: CATARACT EXTRACTION PHACO AND INTRAOCULAR LENS PLACEMENT ([place]) RIGHT ISTENT INJ;  Surgeon: Eulogio Bear, MD;  Location: [facility];  Service: Ophthalmology;  Laterality: Right;  4.29 0:35.6   COLONOSCOPY     HERNIA REPAIR Left    inguinal hernia repair in [place] MICRODISCECTOMY Left 02/24/2014   Procedure: LUMBAR LAMINECTOMY/DECOMPRESSION MICRODISCECTOMY LUMBAR THREE-FOUR, FOUR-FIVE, LEFT FIVE-SACRAL ONE ;  Surgeon: Charlie Pitter, MD;  Location: [facility];  Service: Neurosurgery;  Laterality: Left;  LUMBAR LAMINECTOMY/DECOMPRESSION MICRODISCECTOMY LUMBAR THREE-FOUR, FOUR-FIVE, LEFT FIVE-SACRAL ONE    LUMBAR LAMINECTOMY/DECOMPRESSION MICRODISCECTOMY N/A 05/03/2021   Procedure: Laminectomy and Foraminotomy - L2-L3;  Surgeon: Earnie Larsson, MD;  Location: [facility];  Service: Neurosurgery;  Laterality: N/A;  3C   POSTERIOR CERVICAL FUSION/FORAMINOTOMY N/A 08/07/2020   Procedure: C3-6 POSTERIOR FUSION WITH DECOMPRESSION;  Surgeon: Meade Maw, MD;  Location: [facility];  Service: Neurosurgery;  Laterality: N/A;   PROSTATECTOMY  8/14   [facility] Dr Mare Ferrari    Patient Active  Problem List   Diagnosis Date Noted   SVT (supraventricular tachycardia) 12/30/2021   Hypothyroidism 12/30/2021   Parkinson's disease 12/30/2021   Elevated troponin 12/30/2021   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   Acquired thrombophilia ([place]) 01/13/2021   History of decompression of median nerve 01/01/2021   Paroxysmal atrial fibrillation ([place]) 10/06/2020   Carotid stenosis, right 09/17/2020   S/P cervical spinal fusion    Leukocytosis    Essential hypertension    Anemia of chronic disease    Postoperative pain    Neuropathic pain    Cervical myelopathy ([place]) 08/07/2020   Preop cardiovascular exam 07/17/2020   SOB (shortness of breath) on exertion 07/17/2020   Leg weakness, bilateral 07/13/2020   Aortic atherosclerosis ([place]) 07/09/2020   Body mass index (BMI) 34.0-34.9, adult 12/25/2019   Myalgia 10/30/2019   Lumbar post-laminectomy syndrome 10/24/2019   PAD (peripheral artery disease) ([place]) 06/06/2019   CKD (chronic kidney disease) stage 3, GFR 30-59 ml/min (HCC) 04/29/2019   B12 deficiency 01/23/2019   Left arm numbness 02/28/2018   Neck pain 02/28/2018   Anemia 02/02/2017   DDD (degenerative disc disease), cervical 02/02/2017   Hypothyroid 02/02/2017   MRSA (methicillin resistant staph aureus) culture positive 02/02/2017   Nocturnal hypoxia 02/02/2017   Senile purpura ([place]) 10/03/2016   Essential hypertension, benign 09/16/2016   Bilateral carotid artery disease ([place]) 09/16/2016   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/18/2016   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   History of prostate cancer 12/21/2015   Left carpal tunnel syndrome 11/11/2015   Kidney stone on left side 07/05/2015   Myasthenia gravis ([place]) 05/26/2015   Radiculitis 01/05/2015   Long-term use of high-risk medication 10/15/2014   Persistent cough 09/10/2014   Pure hypercholesterolemia 07/25/2014   Spinal stenosis, lumbar region, with neurogenic claudication 02/24/2014    Lumbosacral stenosis with neurogenic claudication 02/24/2014   COPD (chronic obstructive pulmonary disease) ([place]) 01/22/2014    REFERRING DIAG: balance disorder   THERAPY DIAG:  Difficulty in walking, not elsewhere classified  Muscle weakness (generalized)  Unsteadiness on feet  Rationale for Evaluation and Treatment Rehabilitation  PERTINENT HISTORY: [place]ife Flight is a 76 year old male referred to OPPT neuro for difficulty with balance. He was also just recently dx in May 2023 with Impingement syndrome of left shoulder (M75.42) Impingement syndrome of left shoulder (primary encounter diagnosis) Left shoulder pain, unspecified chronicity Nontraumatic rupture of left proximal biceps tendon Past medical hx includes Lumbar laminectomy 04/2021. C3-6 decompression, fusion on 11/26, myasthenia gravis, 2012 ACDF 5/6, 6/7; OSA, hypothyroidism, HTN, COPD, multiple back surgeries, 2021 carpal tunnel release.  PRECAUTIONS: Fall  SUBJECTIVE: Patient presents to skilled therapy following ablation surgery on the 19th. Patient reports they are not happy that their surgery was not performed by their primary surgeon and was instead was performed by another surgeon unknown to them. Patient reports they haven't experienced  pain following surgery but generalized weakness. Patient reports no falls or loss of balance since their surgery.   PAIN:  Are you having pain? No  TODAY'S TREATMENT: 07/12/2022  Warmup:  - Regular walking with no use of cane but uses // bars for support 4x   In // bars: - Airex: Standing balance with functional reaching for balls and shooting 2x bucket of balls  - Airex balance beam: balloon tap passing with focus on ankle strategies and functional reach 1 minute   - Speed ladder: Gait training with focus on heel to toe stepping and stride length Single foot in each box with (B) hand support 4x  Single foot in each box with single hand support 2x  Single foot in each box with  (B) 2 finger on bar support 2x   - Standing marches against GTB with emphasis on core engagement  - Lateral walks 6x  - Alternating single leg 3 Hedgehog ball taps 2x10 (B)  Seated:  - Ball squeezed 12x3 second holds  - Anterior tibialis raises with adduction squeeze alternating with heel raise 10x   Patient required rest breaks due fatigue, HR was checked when patient reported fatigue. Patient required verbal cues during multiple exercises to correct form and optimized body mechanics in order to reduce hip flexor aggravation and decrease energy expenditure.   Vitals: HR 95 bpm   PATIENT EDUCATION: Education details: Goals, plan, exercise technique, body mechanics, energy conservation techniques  Person educated: Patient Education method: Explanation, Demonstration, Tactile cues, Verbal cues, and Handouts Education comprehension: verbalized understanding, returned demonstration, verbal cues required, and tactile cues required   HOME EXERCISE PROGRAM:  No Updates today  Access Code: [email or web address] URL: [email or web address] Date: 03/01/2022 Prepared by: Janna Arch  Exercises - Seated March  - 1 x daily - 7 x weekly - 2 sets - 10 reps - 5 hold - Seated Long Arc Quad  - 1 x daily - 7 x weekly - 2 sets - 10 reps - 5 hold - Seated Hip Abduction  - 1 x daily - 7 x weekly - 2 sets - 10 reps - 5 hold - Seated Hip Adduction Isometrics with Ball  - 1 x daily - 7 x weekly - 2 sets - 10 reps - 5 hold - Seated Gluteal Sets  - 1 x daily - 7 x weekly - 2 sets - 10 reps - 5 hold - Seated Heel Toe Raises  - 1 x daily - 7 x weekly - 2 sets - 10 reps - 5 hold LE strength and balance         PT Short Term Goals       PT SHORT TERM GOAL #1   Title Pt will be independent with initial HEP in order to improve strength and balance in order to decrease fall risk and improve function at home and work.    Baseline 02/23/2022- Patient reports not doing much as far as exercise in the home  currently. 04/20/2022- Patient reports compliant with HEP and no questions at this time.; 9/7 pt indep   Time 6    Period Weeks    Status Goal met   Target Date 04/06/22              PT Long Term Goals       PT LONG TERM GOAL #1   Title Pt will be independent with final HEP in order to improve strength and balance in order to decrease fall risk and improve  function at home and work. 04/20/2022- Patient reports compliant with HEP and no questions at this time.    Baseline 02/23/2022= No formal HEP in place. 9/7: pt performing at least once a day, feels comfortable   Time 12    Period Weeks    Status GOAL MET   Target Date 08/11/2022      PT LONG TERM GOAL #2   Title Pt will decrease 5TSTS by at least 8 seconds in order to demonstrate clinically significant improvement in LE strength.    Baseline 02/23/2022= 31.52 sec with B hands on knees; 04/20/2022= 16.08 sec with B hands on knees   Time 12    Period Days    Status Goal Met   Target Date 05/18/22      PT LONG TERM GOAL #3   Title Pt will increase 10MWT by at least 0.23 m/s in order to demonstrate clinically significant improvement in community ambulation.    Baseline 02/23/2022= 0.39 m/s using RW; 0.45 m/s; 9/7: 0.37 m/s with QC, 0.74 m/s with RW   Time 12    Period Weeks    Status GOAL MET   Target Date 05/18/22      PT LONG TERM GOAL #4   Title Pt will improve FOTO to target score of 50 to display perceived improvements in ability to complete ADL's.    Baseline 02/23/2022= 47; 9/7: 49   Time 12    Period Weeks    Status Partially met     Target Date 08/11/2022      PT LONG TERM GOAL #5   Title Pt will decrease TUG to below 20 seconds/decrease in order to demonstrate decreased fall risk.    Baseline 02/23/2022=28.27 sec with RW; 04/20/2022= 20.22 with SBQC; 9/7: 14.4 sec with RW, 18.5 sec with SBQC   Time 12    Period Weeks    Status MET   Target Date 05/18/22    PT LONG TERM GOAL #6  Title Pt will decrease TUG to 14 sec  or below with SBQC in order to demonstrate decreased fall risk.   Baseline  9/7: 14.4 sec with RW, 18.5 sec with SBQC  Time 12   Period Weeks   Status NEW  Target Date 08/11/2022   PT LONG TERM GOAL #7  Title Patient will increase six minute walk test distance to >700 ft for improved gait ability and increased ease with community participation.  Baseline 9/7: completes 4 minutes and 444 ft with RW. Test discontinued at 4 minutes due to [place].   Time 12   Period Weeks   Status NEW  Target Date 08/11/2022              Plan -     Clinical Impression Statement Patient presents to therapy today with some soreness throughout attributed to the cold weather today. Patient reported pain in their R hip flexor when walking in // bars without hand support, the patient was advised to utilize the hand rails to take some weight off the (R) leg. Pt. reported a decrease in pain following cue. Patient reports increased HR following ablation surgery. Overall, patient displays great motivation for today's activities performing all tasks. He was challenged with dual task activities but improved with practice and verbal cuing. Pt will continue to benefit from skilled physical therapy intervention to address impairments, improve QOL, and attain therapy goals.     Personal Factors and Comorbidities Age;Past/Current Experience;Time since onset of injury/illness/exacerbation;Comorbidity 3+    Comorbidities  COPD, History of Cancer, myasthenia gravis, chronic lumbar surgical history    Examination-Activity Limitations Lift;Squat;Bend;Stairs;Stand;Transfers;Insurance claims handler;Bathing;Hygiene/Grooming;Dressing;Toileting;Bed Mobility;Caring for Others;Continence    Examination-Participation Restrictions Yard Work;Church;Cleaning;Driving;Community Activity    Stability/Clinical Decision Making Evolving/Moderate complexity    Rehab Potential Good    Clinical Impairments Affecting Rehab Potential  multipe comorbidities    PT Frequency 2x / week    PT Duration 12 weeks    PT Treatment/Interventions ADLs/Self Care Home Management;Electrical Stimulation;Moist Heat;Traction;Ultrasound;Gait training;Stair training;Functional mobility training;Therapeutic activities;Therapeutic exercise;Balance training;Neuromuscular re-education;Patient/family education;Manual techniques;Passive range of motion;Dry needling;Joint Manipulations;Cryotherapy;Vestibular;Canalith Repostioning    PT Next Visit Plan standing strengthening and balance, seated strengthening and coordination    PT Home Exercise Plan Progress LE strengthen and functional endurance, continue plan   Consulted and Agree with Plan of Care Patient            Sudie Bailey SPT  This entire session was performed under direct supervision and direction of a licensed therapist/therapist assistant . I have personally read, edited and approve of the note as written.  Janna Arch PT  07/12/22, 4:10 PM

## 2022-07-14 ENCOUNTER — Ambulatory Visit: Payer: Medicare Other | Attending: Neurosurgery

## 2022-07-14 DIAGNOSIS — G959 Disease of spinal cord, unspecified: Secondary | ICD-10-CM | POA: Diagnosis present

## 2022-07-14 DIAGNOSIS — M545 Low back pain, unspecified: Secondary | ICD-10-CM | POA: Insufficient documentation

## 2022-07-14 DIAGNOSIS — R278 Other lack of coordination: Secondary | ICD-10-CM | POA: Insufficient documentation

## 2022-07-14 DIAGNOSIS — M5442 Lumbago with sciatica, left side: Secondary | ICD-10-CM | POA: Diagnosis present

## 2022-07-14 DIAGNOSIS — R262 Difficulty in walking, not elsewhere classified: Secondary | ICD-10-CM | POA: Insufficient documentation

## 2022-07-14 DIAGNOSIS — R296 Repeated falls: Secondary | ICD-10-CM | POA: Diagnosis present

## 2022-07-14 DIAGNOSIS — G8929 Other chronic pain: Secondary | ICD-10-CM | POA: Insufficient documentation

## 2022-07-14 DIAGNOSIS — R269 Unspecified abnormalities of gait and mobility: Secondary | ICD-10-CM | POA: Diagnosis present

## 2022-07-14 DIAGNOSIS — M6281 Muscle weakness (generalized): Secondary | ICD-10-CM | POA: Diagnosis present

## 2022-07-14 DIAGNOSIS — R2689 Other abnormalities of gait and mobility: Secondary | ICD-10-CM | POA: Diagnosis present

## 2022-07-14 DIAGNOSIS — R2681 Unsteadiness on feet: Secondary | ICD-10-CM | POA: Insufficient documentation

## 2022-07-14 NOTE — Therapy (Signed)
OUTPATIENT PHYSICAL THERAPY TREATMENT NOTE    Patient Name: Francisco [name] MRN: [id number] DOB:07/26/1946, 76 y.o., male Today's Date: 07/15/2022  PCP: Adin Hector, MD REFERRING PROVIDER: Meade Maw MD    PT End of Session - 07/14/22 0852     Visit Number 29    Number of Visits 64    Date for PT Re-Evaluation 08/11/22    Authorization Time Period -08/11/22    Progress Note Due on Visit 69    PT Start Time 0847    PT Stop Time 0928    PT Time Calculation (min) 41 min    Equipment Utilized During Treatment Gait belt    Activity Tolerance Patient tolerated treatment well    Behavior During Therapy [facility] for tasks assessed/performed                          Past Medical History:  Diagnosis Date   Arthritis    lower left hip   Atypical angina    Bilateral hand numbness    from back surgery   Bronchitis, chronic (HCC)    Cancer ([place])    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; Merkel cell cancer, and Basal cell cancer (twice; 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   Carotid stenosis    CHF (congestive heart failure) (HCC)    CKD (chronic kidney disease)    CKD (chronic kidney disease) stage 3, GFR 30-59 ml/min (HCC)    COPD (chronic obstructive pulmonary disease) ([place])    stage 2   DDD (degenerative disc disease), cervical    Dysrhythmia    post carotid stent bradycardia; PAF 09/2020   GERD (gastroesophageal reflux disease)    Hypercholesterolemia    Hypertension    Hypothyroidism    pt takes Levothyroxine daily   Lumbosacral spinal stenosis    Myasthenia gravis, adult form ([place])    PAD (peripheral artery disease) ([place])    Shortness of breath    Lung MD- Dr Darlin Coco   Sleep apnea    do not use CPAP every night   Past Surgical History:  Procedure Laterality Date   ANTERIOR CERVICAL DECOMP/DISCECTOMY FUSION  07/18/2011   Procedure: ANTERIOR CERVICAL DECOMPRESSION/DISCECTOMY FUSION 2 LEVELS;  Surgeon: Cooper Render Pool;  Location: [place] [facility]UR[facility] OR[facility];  Service:  Neurosurgery;  Laterality: N/A;  cervical five-six, cervical six-seven anterior cervical discectomy and fusion   BACK SURGERY     in [place]     01/2020 Right, 04/2020 Left   CARDIAC CATHETERIZATION     2005 at [facility], no stents   CAROTID PTA/STENT INTERVENTION N/A 09/17/2020   Procedure: CAROTID PTA/STENT INTERVENTION;  Surgeon: Algernon Huxley, MD;  Location: [place] CV LAB;  Service: Cardiovascular;  Laterality: N/A;   CATARACT EXTRACTION W/PHACO Left 01/06/2020   Procedure: CATARACT EXTRACTION PHACO AND INTRAOCULAR LENS PLACEMENT (IOC) ISTENT INJ LEFT 3.81  00:33.3;  Surgeon: Eulogio Bear, MD;  Location: [place];  Service: Ophthalmology;  Laterality: Left;   CATARACT EXTRACTION W/PHACO Right 02/03/2020   Procedure: CATARACT EXTRACTION PHACO AND INTRAOCULAR LENS PLACEMENT ([place]) RIGHT ISTENT INJ;  Surgeon: Eulogio Bear, MD;  Location: [place];  Service: Ophthalmology;  Laterality: Right;  4.29 0:35.6   COLONOSCOPY     HERNIA REPAIR Left    inguinal hernia repair in [place] MICRODISCECTOMY Left 02/24/2014   Procedure: LUMBAR LAMINECTOMY/DECOMPRESSION MICRODISCECTOMY LUMBAR THREE-FOUR, FOUR-FIVE, LEFT FIVE-SACRAL ONE ;  Surgeon: Charlie Pitter, MD;  Location: [place] [facility]UR[facility] ORS;  Service: Neurosurgery;  Laterality: Left;  LUMBAR LAMINECTOMY/DECOMPRESSION MICRODISCECTOMY LUMBAR THREE-FOUR, FOUR-FIVE, LEFT FIVE-SACRAL ONE    LUMBAR LAMINECTOMY/DECOMPRESSION MICRODISCECTOMY N/A 05/03/2021   Procedure: Laminectomy and Foraminotomy - L2-L3;  Surgeon: Earnie Larsson, MD;  Location: [place];  Service: Neurosurgery;  Laterality: N/A;  3C   POSTERIOR CERVICAL FUSION/FORAMINOTOMY N/A 08/07/2020   Procedure: C3-6 POSTERIOR FUSION WITH DECOMPRESSION;  Surgeon: Meade Maw, MD;  Location: [facility] ORS;  Service: Neurosurgery;  Laterality: N/A;   PROSTATECTOMY  8/14   [facility] Dr Mare Ferrari    Patient Active  Problem List   Diagnosis Date Noted   SVT (supraventricular tachycardia) 12/30/2021   Hypothyroidism 12/30/2021   Parkinson's disease 12/30/2021   Elevated troponin 12/30/2021   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   Acquired thrombophilia ([place]) 01/13/2021   History of decompression of median nerve 01/01/2021   Paroxysmal atrial fibrillation ([place]) 10/06/2020   Carotid stenosis, right 09/17/2020   S/P cervical spinal fusion    Leukocytosis    Essential hypertension    Anemia of chronic disease    Postoperative pain    Neuropathic pain    Cervical myelopathy ([place]) 08/07/2020   Preop cardiovascular exam 07/17/2020   SOB (shortness of breath) on exertion 07/17/2020   Leg weakness, bilateral 07/13/2020   Aortic atherosclerosis ([place]) 07/09/2020   Body mass index (BMI) 34.0-34.9, adult 12/25/2019   Myalgia 10/30/2019   Lumbar post-laminectomy syndrome 10/24/2019   PAD (peripheral artery disease) ([place]) 06/06/2019   CKD (chronic kidney disease) stage 3, GFR 30-59 ml/min (HCC) 04/29/2019   B12 deficiency 01/23/2019   Left arm numbness 02/28/2018   Neck pain 02/28/2018   Anemia 02/02/2017   DDD (degenerative disc disease), cervical 02/02/2017   Hypothyroid 02/02/2017   MRSA (methicillin resistant staph aureus) culture positive 02/02/2017   Nocturnal hypoxia 02/02/2017   Senile purpura ([place]) 10/03/2016   Essential hypertension, benign 09/16/2016   Bilateral carotid artery disease ([place]) 09/16/2016   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/18/2016   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   History of prostate cancer 12/21/2015   Left carpal tunnel syndrome 11/11/2015   Kidney stone on left side 07/05/2015   Myasthenia gravis ([place]) 05/26/2015   Radiculitis 01/05/2015   Long-term use of high-risk medication 10/15/2014   Persistent cough 09/10/2014   Pure hypercholesterolemia 07/25/2014   Spinal stenosis, lumbar region, with neurogenic claudication 02/24/2014    Lumbosacral stenosis with neurogenic claudication 02/24/2014   COPD (chronic obstructive pulmonary disease) ([place]) 01/22/2014    REFERRING DIAG: balance disorder   THERAPY DIAG:  Difficulty in walking, not elsewhere classified  Muscle weakness (generalized)  Unsteadiness on feet  Other abnormalities of gait and mobility  Other lack of coordination  Chronic bilateral low back pain, unspecified whether sciatica present  Repeated falls  Rationale for Evaluation and Treatment Rehabilitation  PERTINENT HISTORY: Muneer Leider is a 76 year old male referred to [facility] neuro for difficulty with balance. He was also just recently dx in May 2023 with Impingement syndrome of left shoulder (M75.42) Impingement syndrome of left shoulder (primary encounter diagnosis) Left shoulder pain, unspecified chronicity Nontraumatic rupture of left proximal biceps tendon Past medical hx includes Lumbar laminectomy 04/2021. C3-6 decompression, fusion on 11/26, myasthenia gravis, 2012 ACDF 5/6, 6/7; OSA, hypothyroidism, HTN, COPD, multiple back surgeries, 2021 carpal tunnel release.  PRECAUTIONS: Fall  SUBJECTIVE: Patient reports no issues since ablation procedure. States having some on/off right knee pain which is bothering him  today. States feeling really weak and unsure how much he can do.    PAIN:  Are you having pain? Yes, Right knee - 8/10 sharp  TODAY'S TREATMENT: 07/14/2022   Manual Therapy:   PROM B KNEES- (no report of increased pain)   Grade 2-3 tibiofemoral A/P and P/A joint mobs- 30 bouts x 3 in varying (increasing knee flex ROM)   Therex:  Seated ham curls with BTB x 15 reps each LE (no reported increased right knee pain)  Seated LAQ 2.5# alt LE (both LE only partial ROM today)  Seated Hip flex 2.5# alt LE (VC to hold for 2 sec) - minimal height both LE Ambulation in // bars - 2.5#- 6 steps forward and retro to make it to end of bar. Patient fatigued quickly   Anterior tibialis raises   10x (patient reported as Hard) - Minimal height with left LE Calf raises 2.5 # x 25 reps Side stepping in // bars with 2.5# AW down and back x 2. Patient stopped secondary to fatigue.            Patient required rest breaks due fatigue, HR was checked when patient reported fatigue. Patient required verbal cues during multiple exercises to correct form and optimized body mechanics in order to reduce hip flexor aggravation and decrease energy expenditure.   Vitals: HR 95 bpm   PATIENT EDUCATION: Education details: Goals, plan, exercise technique, body mechanics, energy conservation techniques  Person educated: Patient Education method: Explanation, Demonstration, Tactile cues, Verbal cues, and Handouts Education comprehension: verbalized understanding, returned demonstration, verbal cues required, and tactile cues required   HOME EXERCISE PROGRAM:  No Updates today  Access Code: [email or web address] URL: [email or web address] Date: 03/01/2022 Prepared by: Janna Arch  Exercises - Seated March  - 1 x daily - 7 x weekly - 2 sets - 10 reps - 5 hold - Seated Long Arc Quad  - 1 x daily - 7 x weekly - 2 sets - 10 reps - 5 hold - Seated Hip Abduction  - 1 x daily - 7 x weekly - 2 sets - 10 reps - 5 hold - Seated Hip Adduction Isometrics with Ball  - 1 x daily - 7 x weekly - 2 sets - 10 reps - 5 hold - Seated Gluteal Sets  - 1 x daily - 7 x weekly - 2 sets - 10 reps - 5 hold - Seated Heel Toe Raises  - 1 x daily - 7 x weekly - 2 sets - 10 reps - 5 hold LE strength and balance         PT Short Term Goals       PT SHORT TERM GOAL #1   Title Pt will be independent with initial HEP in order to improve strength and balance in order to decrease fall risk and improve function at home and work.    Baseline 02/23/2022- Patient reports not doing much as far as exercise in the home currently. 04/20/2022- Patient reports compliant with HEP and no questions at this time.; 9/7 pt indep    Time 6    Period Weeks    Status Goal met   Target Date 04/06/22              PT Long Term Goals       PT LONG TERM GOAL #1   Title Pt will be independent with final HEP in order to improve strength and balance in order to decrease fall risk and improve function  at home and work. 04/20/2022- Patient reports compliant with HEP and no questions at this time.    Baseline 02/23/2022= No formal HEP in place. 9/7: pt performing at least once a day, feels comfortable   Time 12    Period Weeks    Status GOAL MET   Target Date 08/11/2022      PT LONG TERM GOAL #2   Title Pt will decrease 5TSTS by at least 8 seconds in order to demonstrate clinically significant improvement in LE strength.    Baseline 02/23/2022= 31.52 sec with B hands on knees; 04/20/2022= 16.08 sec with B hands on knees   Time 12    Period Days    Status Goal Met   Target Date 05/18/22      PT LONG TERM GOAL #3   Title Pt will increase 10MWT by at least 0.23 m/s in order to demonstrate clinically significant improvement in community ambulation.    Baseline 02/23/2022= 0.39 m/s using RW; 0.45 m/s; 9/7: 0.37 m/s with QC, 0.74 m/s with RW   Time 12    Period Weeks    Status GOAL MET   Target Date 05/18/22      PT LONG TERM GOAL #4   Title Pt will improve FOTO to target score of 50 to display perceived improvements in ability to complete ADL's.    Baseline 02/23/2022= 47; 9/7: 49   Time 12    Period Weeks    Status Partially met     Target Date 08/11/2022      PT LONG TERM GOAL #5   Title Pt will decrease TUG to below 20 seconds/decrease in order to demonstrate decreased fall risk.    Baseline 02/23/2022=28.27 sec with RW; 04/20/2022= 20.22 with SBQC; 9/7: 14.4 sec with RW, 18.5 sec with SBQC   Time 12    Period Weeks    Status MET   Target Date 05/18/22    PT LONG TERM GOAL #6  Title Pt will decrease TUG to 14 sec or below with SBQC in order to demonstrate decreased fall risk.   Baseline  9/7: 14.4 sec with RW, 18.5  sec with SBQC  Time 12   Period Weeks   Status NEW  Target Date 08/11/2022   PT LONG TERM GOAL #7  Title Patient will increase six minute walk test distance to >700 ft for improved gait ability and increased ease with community participation.  Baseline 9/7: completes 4 minutes and 444 ft with RW. Test discontinued at 4 minutes due to [place].   Time 12   Period Weeks   Status NEW  Target Date 08/11/2022              Plan -     Clinical Impression Statement Patient presents to therapy today with increased soreness specifically throughout right knee. Patient required increased rest breaks and more difficulty with all standing activities.  Pt will continue to benefit from skilled physical therapy intervention to address impairments, improve QOL, and attain therapy goals.     Personal Factors and Comorbidities Age;Past/Current Experience;Time since onset of injury/illness/exacerbation;Comorbidity 3+    Comorbidities COPD, History of Cancer, myasthenia gravis, chronic lumbar surgical history    Examination-Activity Limitations Lift;Squat;Bend;Stairs;Stand;Transfers;Insurance claims handler;Bathing;Hygiene/Grooming;Dressing;Toileting;Bed Mobility;Caring for Others;Continence    Examination-Participation Restrictions Yard Work;Church;Cleaning;Driving;Community Activity    Stability/Clinical Decision Making Evolving/Moderate complexity    Rehab Potential Good    Clinical Impairments Affecting Rehab Potential multipe comorbidities    PT Frequency 2x / week  PT Duration 12 weeks    PT Treatment/Interventions ADLs/Self Care Home Management;Electrical Stimulation;Moist Heat;Traction;Ultrasound;Gait training;Stair training;Functional mobility training;Therapeutic activities;Therapeutic exercise;Balance training;Neuromuscular re-education;Patient/family education;Manual techniques;Passive range of motion;Dry needling;Joint Manipulations;Cryotherapy;Vestibular;Canalith Repostioning     PT Next Visit Plan standing strengthening and balance, seated strengthening and coordination    PT Home Exercise Plan Progress LE strengthen and functional endurance, continue plan   Consulted and Agree with Plan of Care Patient             Lewis Moccasin PT  07/15/22, 9:27 AM

## 2022-07-18 ENCOUNTER — Other Ambulatory Visit: Payer: Self-pay | Admitting: Surgery

## 2022-07-19 ENCOUNTER — Ambulatory Visit: Payer: Medicare Other

## 2022-07-21 ENCOUNTER — Ambulatory Visit: Payer: Medicare Other

## 2022-07-21 DIAGNOSIS — M6281 Muscle weakness (generalized): Secondary | ICD-10-CM

## 2022-07-21 DIAGNOSIS — R262 Difficulty in walking, not elsewhere classified: Secondary | ICD-10-CM | POA: Diagnosis not present

## 2022-07-21 DIAGNOSIS — G8929 Other chronic pain: Secondary | ICD-10-CM

## 2022-07-21 DIAGNOSIS — R296 Repeated falls: Secondary | ICD-10-CM

## 2022-07-21 DIAGNOSIS — R278 Other lack of coordination: Secondary | ICD-10-CM

## 2022-07-21 DIAGNOSIS — R2689 Other abnormalities of gait and mobility: Secondary | ICD-10-CM

## 2022-07-21 DIAGNOSIS — R2681 Unsteadiness on feet: Secondary | ICD-10-CM

## 2022-07-21 NOTE — Therapy (Signed)
OUTPATIENT PHYSICAL THERAPY TREATMENT NOTE/Physical Therapy Progress Note   Dates of reporting period  05/26/2022   to   07/21/2022    Patient Name: Francisco [name] MRN: [id number] DOB:[birth year], 76 y.o., male Today's Date: 07/22/2022  PCP: Adin Hector, MD REFERRING PROVIDER: Meade Maw MD    PT End of Session - 07/21/22 1523     Visit Number 30    Number of Visits 64    Date for PT Re-Evaluation 08/11/22    Authorization Time Period -08/11/22    Progress Note Due on Visit 30    PT Start Time 1515    PT Stop Time 1554    PT Time Calculation (min) 39 min    Equipment Utilized During Treatment Gait belt    Activity Tolerance Patient tolerated treatment well    Behavior During Therapy WFL for tasks assessed/performed                          Past Medical History:  Diagnosis Date   Arthritis    lower left hip   Atypical angina    Bilateral hand numbness    from back surgery   Bronchitis, chronic (HCC)    Cancer ([place])    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; Merkel cell cancer, and Basal cell cancer (twice; 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   Carotid stenosis    CHF (congestive heart failure) (HCC)    CKD (chronic kidney disease)    CKD (chronic kidney disease) stage 3, GFR 30-59 ml/min (HCC)    COPD (chronic obstructive pulmonary disease) ([place])    stage 2   DDD (degenerative disc disease), cervical    Dysrhythmia    post carotid stent bradycardia; PAF 09/2020   GERD (gastroesophageal reflux disease)    Hypercholesterolemia    Hypertension    Hypothyroidism    pt takes Levothyroxine daily   Lumbosacral spinal stenosis    Myasthenia gravis, adult form ([place])    PAD (peripheral artery disease) ([place])    Shortness of breath    Lung MD- Dr Darlin Coco   Sleep apnea    do not use CPAP every night   Past Surgical History:  Procedure Laterality Date   ANTERIOR CERVICAL DECOMP/DISCECTOMY FUSION  07/18/2011   Procedure: ANTERIOR CERVICAL  DECOMPRESSION/DISCECTOMY FUSION 2 LEVELS;  Surgeon: Cooper Render Pool;  Location: [facility];  Service: Neurosurgery;  Laterality: N/A;  cervical five-six, cervical six-seven anterior cervical discectomy and fusion   BACK SURGERY     in [place]     01/2020 Right, 04/2020 Left   CARDIAC CATHETERIZATION     2005 at [facility], no stents   CAROTID PTA/STENT INTERVENTION N/A 09/17/2020   Procedure: CAROTID PTA/STENT INTERVENTION;  Surgeon: Algernon Huxley, MD;  Location: [place] [facility];  Service: Cardiovascular;  Laterality: N/A;   CATARACT EXTRACTION W/PHACO Left 01/06/2020   Procedure: CATARACT EXTRACTION PHACO AND INTRAOCULAR LENS PLACEMENT (IOC) ISTENT INJ LEFT 3.81  00:33.3;  Surgeon: Eulogio Bear, MD;  Location: [place];  Service: Ophthalmology;  Laterality: Left;   CATARACT EXTRACTION W/PHACO Right 02/03/2020   Procedure: CATARACT EXTRACTION PHACO AND INTRAOCULAR LENS PLACEMENT ([place]) RIGHT ISTENT INJ;  Surgeon: Eulogio Bear, MD;  Location: [facility];  Service: Ophthalmology;  Laterality: Right;  4.29 0:35.6   COLONOSCOPY     HERNIA REPAIR Left    inguinal hernia repair in [place]  MICRODISCECTOMY Left 02/24/2014   Procedure: LUMBAR LAMINECTOMY/DECOMPRESSION MICRODISCECTOMY LUMBAR THREE-FOUR, FOUR-FIVE, LEFT FIVE-SACRAL ONE ;  Surgeon: Charlie Pitter, MD;  Location: [facility];  Service: Neurosurgery;  Laterality: Left;  LUMBAR LAMINECTOMY/DECOMPRESSION MICRODISCECTOMY LUMBAR THREE-FOUR, FOUR-FIVE, LEFT FIVE-SACRAL ONE    LUMBAR LAMINECTOMY/DECOMPRESSION MICRODISCECTOMY N/A 05/03/2021   Procedure: Laminectomy and Foraminotomy - L2-L3;  Surgeon: Earnie Larsson, MD;  Location: [facility];  Service: Neurosurgery;  Laterality: N/A;  3C   POSTERIOR CERVICAL FUSION/FORAMINOTOMY N/A 08/07/2020   Procedure: C3-6 POSTERIOR FUSION WITH DECOMPRESSION;  Surgeon: Meade Maw, MD;  Location: [facility];  Service:  Neurosurgery;  Laterality: N/A;   PROSTATECTOMY  8/14   [facility] Dr Mare Ferrari    Patient Active Problem List   Diagnosis Date Noted   SVT (supraventricular tachycardia) 12/30/2021   Hypothyroidism 12/30/2021   Parkinson's disease 12/30/2021   Elevated troponin 12/30/2021   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   Acquired thrombophilia ([place]) 01/13/2021   History of decompression of median nerve 01/01/2021   Paroxysmal atrial fibrillation ([place]) 10/06/2020   Carotid stenosis, right 09/17/2020   S/P cervical spinal fusion    Leukocytosis    Essential hypertension    Anemia of chronic disease    Postoperative pain    Neuropathic pain    Cervical myelopathy ([place]) 08/07/2020   Preop cardiovascular exam 07/17/2020   SOB (shortness of breath) on exertion 07/17/2020   Leg weakness, bilateral 07/13/2020   Aortic atherosclerosis ([place]) 07/09/2020   Body mass index (BMI) 34.0-34.9, adult 12/25/2019   Myalgia 10/30/2019   Lumbar post-laminectomy syndrome 10/24/2019   PAD (peripheral artery disease) ([place]) 06/06/2019   CKD (chronic kidney disease) stage 3, GFR 30-59 ml/min (HCC) 04/29/2019   B12 deficiency 01/23/2019   Left arm numbness 02/28/2018   Neck pain 02/28/2018   Anemia 02/02/2017   DDD (degenerative disc disease), cervical 02/02/2017   Hypothyroid 02/02/2017   MRSA (methicillin resistant staph aureus) culture positive 02/02/2017   Nocturnal hypoxia 02/02/2017   Senile purpura ([place]) 10/03/2016   Essential hypertension, benign 09/16/2016   Bilateral carotid artery disease ([place]) 09/16/2016   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/18/2016   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   History of prostate cancer 12/21/2015   Left carpal tunnel syndrome 11/11/2015   Kidney stone on left side 07/05/2015   Myasthenia gravis ([place]) 05/26/2015   Radiculitis 01/05/2015   Long-term use of high-risk medication 10/15/2014   Persistent cough 09/10/2014   Pure  hypercholesterolemia 07/25/2014   Spinal stenosis, lumbar region, with neurogenic claudication 02/24/2014   Lumbosacral stenosis with neurogenic claudication 02/24/2014   COPD (chronic obstructive pulmonary disease) ([place]) 01/22/2014    REFERRING DIAG: balance disorder   THERAPY DIAG:  Difficulty in walking, not elsewhere classified  Muscle weakness (generalized)  Unsteadiness on feet  Other abnormalities of gait and mobility  Other lack of coordination  Chronic bilateral low back pain, unspecified whether sciatica present  Repeated falls  Rationale for Evaluation and Treatment Rehabilitation  PERTINENT HISTORY: Vitor Overbaugh is a 76 year old male referred to [facility] neuro for difficulty with balance. He was also just recently dx in May 2023 with Impingement syndrome of left shoulder (M75.42) Impingement syndrome of left shoulder (primary encounter diagnosis) Left shoulder pain, unspecified chronicity Nontraumatic rupture of left proximal biceps tendon Past medical hx includes Lumbar laminectomy 04/2021. C3-6 decompression, fusion on 11/26, myasthenia gravis, 2012 ACDF 5/6, 6/7; OSA, hypothyroidism, HTN, COPD, multiple back surgeries, 2021 carpal tunnel release.  PRECAUTIONS: Fall  SUBJECTIVE: Patient  reports having a rough week with continued pain- today mostly on right posteriolateral Hip and down lateral thigh down to lateral right knee. States has not been as mobile.   PAIN:  Are you having pain? Yes, Right side lateral hip/thigh/knee - 7/10 achy and sharp with movement  TODAY'S TREATMENT: 07/21/2022   Manual Therapy: Moist heat to low back/gluteal region while in supine  Knee to chest- manual stretch- hold 30 sec x 4 each leg Hamstring- hold 30 sec x 4 Right LE  Sciatic Nerve flossing - 30 bouts in 3 varying (progressive SLR positions from 20-60deg)  Right LE Piriformis- hold 30 sec x4 Right LE (patient very tight and unable to accept much of stretch)  Figure 4- hold 30  sec x 4 Right LE Lower trunk rotation - hold 30 sec x 4 each direction Rolling stick in sidelye to right gluteal/piriformis/TFL/IT Band- lateral hamstring/quad down to lateral knee- Patient presents with multiple trigger points - jumpy with piriformis and distal IT band mostly. Reported a good hurt.   *After manual techniques -Patient reported feeling better- "The stretching really helped."      PATIENT EDUCATION: Education details: Goals, plan, exercise technique, body mechanics, energy conservation techniques  Person educated: Patient Education method: Explanation, Demonstration, Tactile cues, Verbal cues, and Handouts Education comprehension: verbalized understanding, returned demonstration, verbal cues required, and tactile cues required   HOME EXERCISE PROGRAM:  No Updates today  Access Code: [email or web address] URL: [email or web address] Date: 03/01/2022 Prepared by: Janna Arch  Exercises - Seated March  - 1 x daily - 7 x weekly - 2 sets - 10 reps - 5 hold - Seated Long Arc Quad  - 1 x daily - 7 x weekly - 2 sets - 10 reps - 5 hold - Seated Hip Abduction  - 1 x daily - 7 x weekly - 2 sets - 10 reps - 5 hold - Seated Hip Adduction Isometrics with Ball  - 1 x daily - 7 x weekly - 2 sets - 10 reps - 5 hold - Seated Gluteal Sets  - 1 x daily - 7 x weekly - 2 sets - 10 reps - 5 hold - Seated Heel Toe Raises  - 1 x daily - 7 x weekly - 2 sets - 10 reps - 5 hold LE strength and balance         PT Short Term Goals       PT SHORT TERM GOAL #1   Title Pt will be independent with initial HEP in order to improve strength and balance in order to decrease fall risk and improve function at home and work.    Baseline 02/23/2022- Patient reports not doing much as far as exercise in the home currently. 04/20/2022- Patient reports compliant with HEP and no questions at this time.; 9/7 pt indep   Time 6    Period Weeks    Status Goal met   Target Date 04/06/22               PT Long Term Goals       PT LONG TERM GOAL #1   Title Pt will be independent with final HEP in order to improve strength and balance in order to decrease fall risk and improve function at home and work. 04/20/2022- Patient reports compliant with HEP and no questions at this time.    Baseline 02/23/2022= No formal HEP in place. 9/7: pt performing at least once a day, feels comfortable   Time 12  Period Weeks    Status GOAL MET   Target Date 08/11/2022      PT LONG TERM GOAL #2   Title Pt will decrease 5TSTS by at least 8 seconds in order to demonstrate clinically significant improvement in LE strength.    Baseline 02/23/2022= 31.52 sec with B hands on knees; 04/20/2022= 16.08 sec with B hands on knees   Time 12    Period Days    Status Goal Met   Target Date 05/18/22      PT LONG TERM GOAL #3   Title Pt will increase 10MWT by at least 0.23 m/s in order to demonstrate clinically significant improvement in community ambulation.    Baseline 02/23/2022= 0.39 m/s using RW; 0.45 m/s; 9/7: 0.37 m/s with QC, 0.74 m/s with RW   Time 12    Period Weeks    Status GOAL MET   Target Date 05/18/22      PT LONG TERM GOAL #4   Title Pt will improve FOTO to target score of 50 to display perceived improvements in ability to complete ADL's.    Baseline 02/23/2022= 47; 9/7: 49   Time 12    Period Weeks    Status Partially met     Target Date 08/11/2022      PT LONG TERM GOAL #5   Title Pt will decrease TUG to below 20 seconds/decrease in order to demonstrate decreased fall risk.    Baseline 02/23/2022=28.27 sec with RW; 04/20/2022= 20.22 with SBQC; 9/7: 14.4 sec with RW, 18.5 sec with SBQC   Time 12    Period Weeks    Status MET   Target Date 05/18/22    PT LONG TERM GOAL #6  Title Pt will decrease TUG to 14 sec or below with SBQC in order to demonstrate decreased fall risk.   Baseline  9/7: 14.4 sec with RW, 18.5 sec with SBQC  Time 12   Period Weeks   Status NEW  Target Date 08/11/2022   PT  LONG TERM GOAL #7  Title Patient will increase six minute walk test distance to >700 ft for improved gait ability and increased ease with community participation.  Baseline 9/7: completes 4 minutes and 444 ft with RW. Test discontinued at 4 minutes due to [place].   Time 12   Period Weeks   Status NEW  Target Date 08/11/2022              Plan -     Clinical Impression Statement Treatment shifted focus as patient entered clinic with neuropathic reported pain radiating from right hip down to right knee. Utilized manual techniques per plan of care to assist with mobility and pain relief today. Patient repsonded well to heat/stretching and mobilization techniques with reported decreased pain and states feeling better with less achy symptoms.   Pt will continue to benefit from skilled physical therapy intervention to address impairments, improve QOL, and attain therapy goals.     Personal Factors and Comorbidities Age;Past/Current Experience;Time since onset of injury/illness/exacerbation;Comorbidity 3+    Comorbidities COPD, History of Cancer, myasthenia gravis, chronic lumbar surgical history    Examination-Activity Limitations Lift;Squat;Bend;Stairs;Stand;Transfers;Insurance claims handler;Bathing;Hygiene/Grooming;Dressing;Toileting;Bed Mobility;Caring for Others;Continence    Examination-Participation Restrictions Yard Work;Church;Cleaning;Driving;Community Activity    Stability/Clinical Decision Making Evolving/Moderate complexity    Rehab Potential Good    Clinical Impairments Affecting Rehab Potential multipe comorbidities    PT Frequency 2x / week    PT Duration 12 weeks    PT Treatment/Interventions ADLs/Self Care Home  Management;Electrical Stimulation;Moist Heat;Traction;Ultrasound;Gait training;Stair training;Functional mobility training;Therapeutic activities;Therapeutic exercise;Balance training;Neuromuscular re-education;Patient/family education;Manual  techniques;Passive range of motion;Dry needling;Joint Manipulations;Cryotherapy;Vestibular;Canalith Repostioning    PT Next Visit Plan standing strengthening and balance, seated strengthening and coordination    PT Home Exercise Plan Progress LE strengthen and functional endurance, continue plan   Consulted and Agree with Plan of Care Patient             Lewis Moccasin PT  07/22/22, 6:17 AM

## 2022-07-26 ENCOUNTER — Ambulatory Visit: Payer: Medicare Other

## 2022-07-28 ENCOUNTER — Ambulatory Visit: Payer: Medicare Other

## 2022-07-28 DIAGNOSIS — R296 Repeated falls: Secondary | ICD-10-CM

## 2022-07-28 DIAGNOSIS — R278 Other lack of coordination: Secondary | ICD-10-CM

## 2022-07-28 DIAGNOSIS — R269 Unspecified abnormalities of gait and mobility: Secondary | ICD-10-CM

## 2022-07-28 DIAGNOSIS — G959 Disease of spinal cord, unspecified: Secondary | ICD-10-CM

## 2022-07-28 DIAGNOSIS — M545 Low back pain, unspecified: Secondary | ICD-10-CM

## 2022-07-28 DIAGNOSIS — R262 Difficulty in walking, not elsewhere classified: Secondary | ICD-10-CM | POA: Diagnosis not present

## 2022-07-28 DIAGNOSIS — R2689 Other abnormalities of gait and mobility: Secondary | ICD-10-CM

## 2022-07-28 DIAGNOSIS — M6281 Muscle weakness (generalized): Secondary | ICD-10-CM

## 2022-07-28 DIAGNOSIS — G8929 Other chronic pain: Secondary | ICD-10-CM

## 2022-07-28 DIAGNOSIS — R2681 Unsteadiness on feet: Secondary | ICD-10-CM

## 2022-07-28 NOTE — Therapy (Signed)
OUTPATIENT PHYSICAL THERAPY TREATMENT NOTE   Patient Name: Francisco [name] MRN: [id number] DOB:[birth year], 76 y.o., male Today's Date: 07/29/2022  PCP: Adin Hector, MD REFERRING PROVIDER: Meade Maw MD    PT End of Session - 07/28/22 1517     Visit Number 31    Number of Visits 53    Date for PT Re-Evaluation 08/11/22    Authorization Time Period -08/11/22    Progress Note Due on Visit 43    PT Start Time 1513    PT Stop Time 1558    PT Time Calculation (min) 45 min    Equipment Utilized During Treatment Gait belt    Activity Tolerance Patient tolerated treatment well    Behavior During Therapy WFL for tasks assessed/performed                          Past Medical History:  Diagnosis Date   Arthritis    lower left hip   Atypical angina    Bilateral hand numbness    from back surgery   Bronchitis, chronic (HCC)    Cancer ([place])    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; Merkel cell cancer, and Basal cell cancer (twice; 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   Carotid stenosis    CHF (congestive heart failure) (HCC)    CKD (chronic kidney disease)    CKD (chronic kidney disease) stage 3, GFR 30-59 ml/min (HCC)    COPD (chronic obstructive pulmonary disease) ([place])    stage 2   DDD (degenerative disc disease), cervical    Dysrhythmia    post carotid stent bradycardia; PAF 09/2020   GERD (gastroesophageal reflux disease)    Hypercholesterolemia    Hypertension    Hypothyroidism    pt takes Levothyroxine daily   Lumbosacral spinal stenosis    Myasthenia gravis, adult form ([place])    PAD (peripheral artery disease) ([place])    Shortness of breath    Lung MD- Dr Darlin Coco   Sleep apnea    do not use CPAP every night   Past Surgical History:  Procedure Laterality Date   ANTERIOR CERVICAL DECOMP/DISCECTOMY FUSION  07/18/2011   Procedure: ANTERIOR CERVICAL DECOMPRESSION/DISCECTOMY FUSION 2 LEVELS;  Surgeon: Cooper Render Pool;  Location: [facility];  Service: Neurosurgery;   Laterality: N/A;  cervical five-six, cervical six-seven anterior cervical discectomy and fusion   BACK SURGERY     in [place]     01/2020 Right, 04/2020 Left   CARDIAC CATHETERIZATION     2005 at [facility], no stents   CAROTID PTA/STENT INTERVENTION N/A 09/17/2020   Procedure: CAROTID PTA/STENT INTERVENTION;  Surgeon: Algernon Huxley, MD;  Location: [facility];  Service: Cardiovascular;  Laterality: N/A;   CATARACT EXTRACTION W/PHACO Left 01/06/2020   Procedure: CATARACT EXTRACTION PHACO AND INTRAOCULAR LENS PLACEMENT (IOC) ISTENT INJ LEFT 3.81  00:33.3;  Surgeon: Eulogio Bear, MD;  Location: [place];  Service: Ophthalmology;  Laterality: Left;   CATARACT EXTRACTION W/PHACO Right 02/03/2020   Procedure: CATARACT EXTRACTION PHACO AND INTRAOCULAR LENS PLACEMENT ([place]) RIGHT ISTENT INJ;  Surgeon: Eulogio Bear, MD;  Location: [place];  Service: Ophthalmology;  Laterality: Right;  4.29 0:35.6   COLONOSCOPY     HERNIA REPAIR Left    inguinal hernia repair in 1985   LUMBAR LAMINECTOMY/DECOMPRESSION MICRODISCECTOMY Left 02/24/2014   Procedure: LUMBAR LAMINECTOMY/DECOMPRESSION MICRODISCECTOMY LUMBAR THREE-FOUR, FOUR-FIVE, LEFT FIVE-SACRAL ONE ;  Surgeon:  Charlie Pitter, MD;  Location: [facility];  Service: Neurosurgery;  Laterality: Left;  LUMBAR LAMINECTOMY/DECOMPRESSION MICRODISCECTOMY LUMBAR THREE-FOUR, FOUR-FIVE, LEFT FIVE-SACRAL ONE    LUMBAR LAMINECTOMY/DECOMPRESSION MICRODISCECTOMY N/A 05/03/2021   Procedure: Laminectomy and Foraminotomy - L2-L3;  Surgeon: Earnie Larsson, MD;  Location: [place];  Service: Neurosurgery;  Laterality: N/A;  3C   POSTERIOR CERVICAL FUSION/FORAMINOTOMY N/A 08/07/2020   Procedure: C3-6 POSTERIOR FUSION WITH DECOMPRESSION;  Surgeon: Meade Maw, MD;  Location: [facility];  Service: Neurosurgery;  Laterality: N/A;   PROSTATECTOMY  8/14   [facility] Dr Mare Ferrari    Patient Active Problem List    Diagnosis Date Noted   SVT (supraventricular tachycardia) 12/30/2021   Hypothyroidism 12/30/2021   Parkinson's disease 12/30/2021   Elevated troponin 12/30/2021   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   Acquired thrombophilia ([place]) 01/13/2021   History of decompression of median nerve 01/01/2021   Paroxysmal atrial fibrillation ([place]) 10/06/2020   Carotid stenosis, right 09/17/2020   S/P cervical spinal fusion    Leukocytosis    Essential hypertension    Anemia of chronic disease    Postoperative pain    Neuropathic pain    Cervical myelopathy ([place]) 08/07/2020   Preop cardiovascular exam 07/17/2020   SOB (shortness of breath) on exertion 07/17/2020   Leg weakness, bilateral 07/13/2020   Aortic atherosclerosis ([place]) 07/09/2020   Body mass index (BMI) 34.0-34.9, adult 12/25/2019   Myalgia 10/30/2019   Lumbar post-laminectomy syndrome 10/24/2019   PAD (peripheral artery disease) ([place]) 06/06/2019   CKD (chronic kidney disease) stage 3, GFR 30-59 ml/min (HCC) 04/29/2019   B12 deficiency 01/23/2019   Left arm numbness 02/28/2018   Neck pain 02/28/2018   Anemia 02/02/2017   DDD (degenerative disc disease), cervical 02/02/2017   Hypothyroid 02/02/2017   MRSA (methicillin resistant staph aureus) culture positive 02/02/2017   Nocturnal hypoxia 02/02/2017   Senile purpura ([place]) 10/03/2016   Essential hypertension, benign 09/16/2016   Bilateral carotid artery disease ([place]) 09/16/2016   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/18/2016   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   History of prostate cancer 12/21/2015   Left carpal tunnel syndrome 11/11/2015   Kidney stone on left side 07/05/2015   Myasthenia gravis ([place]) 05/26/2015   Radiculitis 01/05/2015   Long-term use of high-risk medication 10/15/2014   Persistent cough 09/10/2014   Pure hypercholesterolemia 07/25/2014   Spinal stenosis, lumbar region, with neurogenic claudication 02/24/2014   Lumbosacral stenosis  with neurogenic claudication 02/24/2014   COPD (chronic obstructive pulmonary disease) ([place]) 01/22/2014    REFERRING DIAG: balance disorder   THERAPY DIAG:  Difficulty in walking, not elsewhere classified  Muscle weakness (generalized)  Unsteadiness on feet  Other abnormalities of gait and mobility  Other lack of coordination  Chronic bilateral low back pain, unspecified whether sciatica present  Repeated falls  Cervical myelopathy (HCC)  Abnormality of gait and mobility  Chronic left-sided low back pain with left-sided sciatica  Rationale for Evaluation and Treatment Rehabilitation  PERTINENT HISTORY: Curren Mohrmann is a 76 year old male referred to [facility] neuro for difficulty with balance. He was also just recently dx in May 2023 with Impingement syndrome of left shoulder (M75.42) Impingement syndrome of left shoulder (primary encounter diagnosis) Left shoulder pain, unspecified chronicity Nontraumatic rupture of left proximal biceps tendon Past medical hx includes Lumbar laminectomy 04/2021. C3-6 decompression, fusion on 11/26, myasthenia gravis, 2012 ACDF 5/6, 6/7; OSA, hypothyroidism, HTN, COPD, multiple back surgeries, 2021 carpal tunnel release.  PRECAUTIONS: Fall  SUBJECTIVE:  Patient reports No further pain like he had last week but endorses continued B LE weakness and difficulty standing/walking.   PAIN:  Are you having pain? No   TODAY'S TREATMENT: 07/28/2022   THEREX:   Nustep LE Only for muscle strength and endurance- L1 for 5 min  Heel to toe gait sequencing activities:  - 2.5# each LE 2 sets of 10 reps each  (patient reported as challenging and exhausting) - more difficulty left LE - much weaker.  Seated ham curl with 2.5 # each (more effort with Left LE)  2 sets x 10-12 reps (increased time on left)   Seated hip march/swing up/over 1/2 spike ball  0#- 2 sets of 12 reps  Standing step tap onto 6 " block with 1 UE support x 12 reps x 1 set  Dynamic  walking in // bars -down and back x 2 trial without UE support (Decreased step length yet no LOB).     PATIENT EDUCATION: Education details: Goals, plan, exercise technique, body mechanics, energy conservation techniques  Person educated: Patient Education method: Explanation, Demonstration, Tactile cues, Verbal cues, and Handouts Education comprehension: verbalized understanding, returned demonstration, verbal cues required, and tactile cues required   HOME EXERCISE PROGRAM:  No Updates today  Access Code: [email or web address] URL: [email or web address] Date: 03/01/2022 Prepared by: Janna Arch  Exercises - Seated March  - 1 x daily - 7 x weekly - 2 sets - 10 reps - 5 hold - Seated Long Arc Quad  - 1 x daily - 7 x weekly - 2 sets - 10 reps - 5 hold - Seated Hip Abduction  - 1 x daily - 7 x weekly - 2 sets - 10 reps - 5 hold - Seated Hip Adduction Isometrics with Ball  - 1 x daily - 7 x weekly - 2 sets - 10 reps - 5 hold - Seated Gluteal Sets  - 1 x daily - 7 x weekly - 2 sets - 10 reps - 5 hold - Seated Heel Toe Raises  - 1 x daily - 7 x weekly - 2 sets - 10 reps - 5 hold LE strength and balance         PT Short Term Goals       PT SHORT TERM GOAL #1   Title Pt will be independent with initial HEP in order to improve strength and balance in order to decrease fall risk and improve function at home and work.    Baseline 02/23/2022- Patient reports not doing much as far as exercise in the home currently. 04/20/2022- Patient reports compliant with HEP and no questions at this time.; 9/7 pt indep   Time 6    Period Weeks    Status Goal met   Target Date 04/06/22              PT Long Term Goals       PT LONG TERM GOAL #1   Title Pt will be independent with final HEP in order to improve strength and balance in order to decrease fall risk and improve function at home and work. 04/20/2022- Patient reports compliant with HEP and no questions at this time.    Baseline  02/23/2022= No formal HEP in place. 9/7: pt performing at least once a day, feels comfortable   Time 12    Period Weeks    Status GOAL MET   Target Date 08/11/2022      PT LONG TERM GOAL #2   Title Pt  will decrease 5TSTS by at least 8 seconds in order to demonstrate clinically significant improvement in LE strength.    Baseline 02/23/2022= 31.52 sec with B hands on knees; 04/20/2022= 16.08 sec with B hands on knees   Time 12    Period Days    Status Goal Met   Target Date 05/18/22      PT LONG TERM GOAL #3   Title Pt will increase 10MWT by at least 0.23 m/s in order to demonstrate clinically significant improvement in community ambulation.    Baseline 02/23/2022= 0.39 m/s using RW; 0.45 m/s; 9/7: 0.37 m/s with QC, 0.74 m/s with RW   Time 12    Period Weeks    Status GOAL MET   Target Date 05/18/22      PT LONG TERM GOAL #4   Title Pt will improve FOTO to target score of 50 to display perceived improvements in ability to complete ADL's.    Baseline 02/23/2022= 47; 9/7: 49   Time 12    Period Weeks    Status Partially met     Target Date 08/11/2022      PT LONG TERM GOAL #5   Title Pt will decrease TUG to below 20 seconds/decrease in order to demonstrate decreased fall risk.    Baseline 02/23/2022=28.27 sec with RW; 04/20/2022= 20.22 with SBQC; 9/7: 14.4 sec with RW, 18.5 sec with SBQC   Time 12    Period Weeks    Status MET   Target Date 05/18/22    PT LONG TERM GOAL #6  Title Pt will decrease TUG to 14 sec or below with SBQC in order to demonstrate decreased fall risk.   Baseline  9/7: 14.4 sec with RW, 18.5 sec with SBQC  Time 12   Period Weeks   Status NEW  Target Date 08/11/2022   PT LONG TERM GOAL #7  Title Patient will increase six minute walk test distance to >700 ft for improved gait ability and increased ease with community participation.  Baseline 9/7: completes 4 minutes and 444 ft with RW. Test discontinued at 4 minutes due to [place].   Time 12   Period Weeks    Status NEW  Target Date 08/11/2022              Plan -     Clinical Impression Statement Patient presented with good motivation despite report of continued weakness. No further pain so able to focus on LE strength with resistive training including cables today. Patient demonstrated quick fatigue with all therex - particularly with left LE yet able to complete all activities. He was pleased at end of visit that he was able to walk some in // bars without UE support despite being very fatigued.   Pt will continue to benefit from skilled physical therapy intervention to address impairments, improve QOL, and attain therapy goals.     Personal Factors and Comorbidities Age;Past/Current Experience;Time since onset of injury/illness/exacerbation;Comorbidity 3+    Comorbidities COPD, History of Cancer, myasthenia gravis, chronic lumbar surgical history    Examination-Activity Limitations Lift;Squat;Bend;Stairs;Stand;Transfers;Insurance claims handler;Bathing;Hygiene/Grooming;Dressing;Toileting;Bed Mobility;Caring for Others;Continence    Examination-Participation Restrictions Yard Work;Church;Cleaning;Driving;Community Activity    Stability/Clinical Decision Making Evolving/Moderate complexity    Rehab Potential Good    Clinical Impairments Affecting Rehab Potential multipe comorbidities    PT Frequency 2x / week    PT Duration 12 weeks    PT Treatment/Interventions ADLs/Self Care Home Management;Electrical Stimulation;Moist Heat;Traction;Ultrasound;Gait training;Stair training;Functional mobility training;Therapeutic activities;Therapeutic exercise;Balance training;Neuromuscular re-education;Patient/family education;Manual techniques;Passive  range of motion;Dry needling;Joint Manipulations;Cryotherapy;Vestibular;Canalith Repostioning    PT Next Visit Plan standing strengthening and balance, seated strengthening and coordination    PT Home Exercise Plan Progress LE strengthen and  functional endurance, continue plan   Consulted and Agree with Plan of Care Patient             Lewis Moccasin PT  07/29/22, 11:44 AM

## 2022-08-02 ENCOUNTER — Ambulatory Visit: Payer: Medicare Other

## 2022-08-02 ENCOUNTER — Telehealth: Payer: Self-pay

## 2022-08-02 NOTE — Therapy (Incomplete)
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   Carotid stenosis    CHF (congestive heart failure) (HCC)    CKD (chronic kidney disease)    CKD (chronic kidney disease) stage 3, GFR 30-59 ml/min (HCC)    COPD (chronic obstructive pulmonary disease) ([employer])    stage 2   DDD (degenerative disc disease), cervical    Dysrhythmia    post carotid stent bradycardia; PAF 09/2020   GERD (gastroesophageal reflux disease)    Hypercholesterolemia    Hypertension    Hypothyroidism    pt takes Levothyroxine daily   Lumbosacral spinal stenosis    Myasthenia gravis, adult form ([place])    PAD (peripheral artery disease) ([place])    Shortness of breath    Lung MD- Dr Darlin Coco   Sleep apnea    do not use CPAP every night   Past Surgical History:  Procedure Laterality Date   ANTERIOR CERVICAL DECOMP/DISCECTOMY FUSION  07/18/2011   Procedure: ANTERIOR CERVICAL DECOMPRESSION/DISCECTOMY FUSION 2 LEVELS;  Surgeon: Cooper Render Pool;  Location: [place] [facility];  Service: Neurosurgery;  Laterality: N/A;  cervical five-six, cervical six-seven anterior cervical discectomy and fusion   BACK SURGERY     in [place]     01/2020 Right, 04/2020 Left   CARDIAC CATHETERIZATION     [facility], no stents   CAROTID PTA/STENT INTERVENTION N/A 09/17/2020   Procedure: CAROTID PTA/STENT INTERVENTION;  Surgeon: Algernon Huxley, MD;  Location: [place] [facility];  Service: Cardiovascular;  Laterality:  N/A;   CATARACT EXTRACTION W/PHACO Left 01/06/2020   Procedure: CATARACT EXTRACTION PHACO AND INTRAOCULAR LENS PLACEMENT (IOC) ISTENT INJ LEFT 3.81  00:33.3;  Surgeon: Eulogio Bear, MD;  Location: [place];  Service: Ophthalmology;  Laterality: Left;   CATARACT EXTRACTION W/PHACO Right 02/03/2020   Procedure: CATARACT EXTRACTION PHACO AND INTRAOCULAR LENS PLACEMENT ([place]) RIGHT ISTENT INJ;  Surgeon: Eulogio Bear, MD;  Location: [place];  Service: Ophthalmology;  Laterality: Right;  4.29 0:35.6   COLONOSCOPY     HERNIA REPAIR Left    inguinal hernia repair in 1985   LUMBAR LAMINECTOMY/DECOMPRESSION MICRODISCECTOMY Left 02/24/2014   Procedure: LUMBAR LAMINECTOMY/DECOMPRESSION MICRODISCECTOMY LUMBAR THREE-FOUR, FOUR-FIVE, LEFT FIVE-SACRAL ONE ;  Surgeon: Charlie Pitter, MD;  Location: [facility];  Service: Neurosurgery;  Laterality: Left;  LUMBAR LAMINECTOMY/DECOMPRESSION MICRODISCECTOMY LUMBAR THREE-FOUR, FOUR-FIVE, LEFT FIVE-SACRAL ONE    LUMBAR LAMINECTOMY/DECOMPRESSION MICRODISCECTOMY N/A 05/03/2021   Procedure: Laminectomy and Foraminotomy - L2-L3;  Surgeon: Earnie Larsson, MD;  Location: [place];  Service: Neurosurgery;  Laterality: N/A;  3C   POSTERIOR CERVICAL FUSION/FORAMINOTOMY N/A 08/07/2020   Procedure: C3-6 POSTERIOR FUSION WITH DECOMPRESSION;  Surgeon: Meade Maw, MD;  Location: [facility] ORS;  Service: Neurosurgery;  Laterality: N/A;   PROSTATECTOMY  8/14   [facility] Dr Mare Ferrari    Patient Active Problem List  Diagnosis Date Noted   SVT (supraventricular tachycardia) 12/30/2021   Hypothyroidism 12/30/2021   Parkinson's disease 12/30/2021   Elevated troponin 12/30/2021   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   Acquired thrombophilia ([place]) 01/13/2021   History of decompression of median nerve 01/01/2021   Paroxysmal atrial fibrillation ([place]) 10/06/2020   Carotid stenosis, right 09/17/2020   S/P cervical spinal fusion     Leukocytosis    Essential hypertension    Anemia of chronic disease    Postoperative pain    Neuropathic pain    Cervical myelopathy ([place]) 08/07/2020   Preop cardiovascular exam 07/17/2020   SOB (shortness of breath) on exertion 07/17/2020   Leg weakness, bilateral 07/13/2020   Aortic atherosclerosis ([place]) 07/09/2020   Body mass index (BMI) 34.0-34.9, adult 12/25/2019   Myalgia 10/30/2019   Lumbar post-laminectomy syndrome 10/24/2019   PAD (peripheral artery disease) ([place]) 06/06/2019   CKD (chronic kidney disease) stage 3, GFR 30-59 ml/min (HCC) 04/29/2019   B12 deficiency 01/23/2019   Left arm numbness 02/28/2018   Neck pain 02/28/2018   Anemia 02/02/2017   DDD (degenerative disc disease), cervical 02/02/2017   Hypothyroid 02/02/2017   MRSA (methicillin resistant staph aureus) culture positive 02/02/2017   Nocturnal hypoxia 02/02/2017   Senile purpura ([place]) 10/03/2016   Essential hypertension, benign 09/16/2016   Bilateral carotid artery disease ([place]) 09/16/2016   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/18/2016   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  Rationale for Evaluation and Treatment Rehabilitation  PERTINENT HISTORY: Garen Woolbright is a 76 year old male referred to [facility] neuro for difficulty with balance. He was also just recently dx in May 2023 with Impingement syndrome of left shoulder (M75.42)  Impingement syndrome of left shoulder (primary encounter diagnosis) Left shoulder pain, unspecified chronicity Nontraumatic rupture of left proximal biceps tendon Past medical hx includes Lumbar laminectomy 04/2021. C3-6 decompression, fusion on 11/26, myasthenia gravis, 2012 ACDF 5/6, 6/7; OSA, hypothyroidism, HTN, COPD, multiple back surgeries, 2021 carpal tunnel release.  PRECAUTIONS: Fall  SUBJECTIVE: Patient reports No further pain like he had last week but endorses continued B LE weakness and difficulty standing/walking.   PAIN:  Are you having pain? No   TODAY'S TREATMENT: 07/28/2022   THEREX:   Nustep LE Only for muscle strength and endurance- L1 for 5 min  Heel to toe gait sequencing activities:  - 2.5# each LE 2 sets of 10 reps each  (patient reported as challenging and exhausting) - more difficulty left LE - much weaker.  Seated ham curl with 2.5 # each (more effort with Left LE)  2 sets x 10-12 reps (increased time on left)   Seated hip march/swing up/over 1/2 spike ball  0#- 2 sets of 12 reps  Standing step tap onto 6 " block with 1 UE support x 12 reps x 1 set  Dynamic walking in // bars -down and back x 2 trial without UE support (Decreased step length yet no LOB).  PATIENT EDUCATION: Education details: Goals, plan, exercise technique, body mechanics, energy conservation techniques  Person educated: Patient Education method: Explanation, Demonstration, Tactile cues, Verbal cues, and Handouts Education comprehension: verbalized understanding, returned demonstration, verbal cues required, and tactile cues required   HOME EXERCISE PROGRAM:  No Updates today  Access Code: [email or web address] URL: [email or web address] Date: 03/01/2022 Prepared by: Janna Arch  Exercises - Seated March  - 1 x daily - 7 x weekly - 2 sets - 10 reps - 5 hold - Seated Long Arc Quad  - 1 x daily - 7 x weekly - 2 sets - 10 reps - 5 hold - Seated Hip Abduction  - 1 x daily -  7 x weekly - 2 sets - 10 reps - 5 hold - Seated Hip Adduction Isometrics with Ball  - 1 x daily - 7 x weekly - 2 sets - 10 reps - 5 hold - Seated Gluteal Sets  - 1 x daily - 7 x weekly - 2 sets - 10 reps - 5 hold - Seated Heel Toe Raises  - 1 x daily - 7 x weekly - 2 sets - 10 reps - 5 hold LE strength and balance         PT Short Term Goals       PT SHORT TERM GOAL #1   Title Pt will be independent with initial HEP in order to improve strength and balance in order to decrease fall risk and improve function at home and work.    Baseline 02/23/2022- Patient reports not doing much as far as exercise in the home currently. 04/20/2022- Patient reports compliant with HEP and no questions at this time.; 9/7 pt indep   Time 6    Period Weeks    Status Goal met   Target Date 04/06/22              PT Long Term Goals       PT LONG TERM GOAL #1   Title Pt will be independent with final HEP in order to improve strength and balance in order to decrease fall risk and improve function at home and work. 04/20/2022- Patient reports compliant with HEP and no questions at this time.    Baseline 02/23/2022= No formal HEP in place. 9/7: pt performing at least once a day, feels comfortable   Time 12    Period Weeks    Status GOAL MET   Target Date 08/11/2022      PT LONG TERM GOAL #2   Title Pt will decrease 5TSTS by at least 8 seconds in order to demonstrate clinically significant improvement in LE strength.    Baseline 02/23/2022= 31.52 sec with B hands on knees; 04/20/2022= 16.08 sec with B hands on knees   Time 12    Period Days    Status Goal Met   Target Date 05/18/22      PT LONG TERM GOAL #3   Title Pt will increase 10MWT by at least 0.23 m/s in order to demonstrate clinically significant improvement in community ambulation.    Baseline 02/23/2022= 0.39 m/s using RW; 0.45 m/s; 9/7: 0.37 m/s with QC, 0.74 m/s with RW   Time 12    Period Weeks    Status GOAL MET   Target Date 05/18/22       PT LONG TERM GOAL #4   Title Pt will improve FOTO to target score of 50 to display perceived improvements in ability to complete ADL's.    Baseline  02/23/2022= 47; 9/7: 49   Time 12    Period Weeks    Status Partially met     Target Date 08/11/2022      PT LONG TERM GOAL #5   Title Pt will decrease TUG to below 20 seconds/decrease in order to demonstrate decreased fall risk.    Baseline 02/23/2022=28.27 sec with RW; 04/20/2022= 20.22 with SBQC; 9/7: 14.4 sec with RW, 18.5 sec with SBQC   Time 12    Period Weeks    Status MET   Target Date 05/18/22    PT LONG TERM GOAL #6  Title Pt will decrease TUG to 14 sec or below with SBQC in order to demonstrate decreased fall risk.   Baseline  9/7: 14.4 sec with RW, 18.5 sec with SBQC  Time 12   Period Weeks   Status NEW  Target Date 08/11/2022   PT LONG TERM GOAL #7  Title Patient will increase six minute walk test distance to >700 ft for improved gait ability and increased ease with community participation.  Baseline 9/7: completes 4 minutes and 444 ft with RW. Test discontinued at 4 minutes due to [place].   Time 12   Period Weeks   Status NEW  Target Date 08/11/2022              Plan -     Clinical Impression Statement Patient presented with good motivation despite report of continued weakness. No further pain so able to focus on LE strength with resistive training including cables today. Patient demonstrated quick fatigue with all therex - particularly with left LE yet able to complete all activities. He was pleased at end of visit that he was able to walk some in // bars without UE support despite being very fatigued.   Pt will continue to benefit from skilled physical therapy intervention to address impairments, improve QOL, and attain therapy goals.     Personal Factors and Comorbidities Age;Past/Current Experience;Time since onset of injury/illness/exacerbation;Comorbidity 3+    Comorbidities COPD, History of Cancer,  myasthenia gravis, chronic lumbar surgical history    Examination-Activity Limitations Lift;Squat;Bend;Stairs;Stand;Transfers;Insurance claims handler;Bathing;Hygiene/Grooming;Dressing;Toileting;Bed Mobility;Caring for Others;Continence    Examination-Participation Restrictions Yard Work;Church;Cleaning;Driving;Community Activity    Stability/Clinical Decision Making Evolving/Moderate complexity    Rehab Potential Good    Clinical Impairments Affecting Rehab Potential multipe comorbidities    PT Frequency 2x / week    PT Duration 12 weeks    PT Treatment/Interventions ADLs/Self Care Home Management;Electrical Stimulation;Moist Heat;Traction;Ultrasound;Gait training;Stair training;Functional mobility training;Therapeutic activities;Therapeutic exercise;Balance training;Neuromuscular re-education;Patient/family education;Manual techniques;Passive range of motion;Dry needling;Joint Manipulations;Cryotherapy;Vestibular;Canalith Repostioning    PT Next Visit Plan standing strengthening and balance, seated strengthening and coordination    PT Home Exercise Plan Progress LE strengthen and functional endurance, continue plan   Consulted and Agree with Plan of Care Patient             Lewis Moccasin PT  08/02/22, 11:24 AM

## 2022-08-02 NOTE — Telephone Encounter (Signed)
Phone call to patient as he missed his 3:15 appointment. Patient answered phone call and states he forgot his appointment for today. Reviewed appointment time with patient for next week and he verbalized understanding and plans to attend.   Ollen Bowl, [place] at [facility] [phone]

## 2022-08-09 ENCOUNTER — Ambulatory Visit: Payer: Medicare Other

## 2022-08-09 DIAGNOSIS — R296 Repeated falls: Secondary | ICD-10-CM

## 2022-08-09 DIAGNOSIS — R278 Other lack of coordination: Secondary | ICD-10-CM

## 2022-08-09 DIAGNOSIS — G959 Disease of spinal cord, unspecified: Secondary | ICD-10-CM

## 2022-08-09 DIAGNOSIS — R2681 Unsteadiness on feet: Secondary | ICD-10-CM

## 2022-08-09 DIAGNOSIS — R2689 Other abnormalities of gait and mobility: Secondary | ICD-10-CM

## 2022-08-09 DIAGNOSIS — M6281 Muscle weakness (generalized): Secondary | ICD-10-CM

## 2022-08-09 DIAGNOSIS — R262 Difficulty in walking, not elsewhere classified: Secondary | ICD-10-CM

## 2022-08-09 DIAGNOSIS — G8929 Other chronic pain: Secondary | ICD-10-CM

## 2022-08-09 NOTE — Therapy (Signed)
OUTPATIENT PHYSICAL THERAPY TREATMENT NOTE   Patient Name: Francisco [name] MRN: [id number] DOB:01-23-1946, 76 y.o., male Today's Date: 08/09/2022  PCP: Adin Hector, MD REFERRING PROVIDER: Meade Maw MD    PT End of Session - 08/09/22 0846     Visit Number 32    Number of Visits 67    Date for PT Re-Evaluation 08/11/22    Authorization Time Period -08/11/22    Progress Note Due on Visit 17    PT Start Time 0845    PT Stop Time 0929    PT Time Calculation (min) 44 min    Equipment Utilized During Treatment Gait belt    Activity Tolerance Patient tolerated treatment well    Behavior During Therapy [facility] for tasks assessed/performed                          Past Medical History:  Diagnosis Date   Arthritis    lower left hip   Atypical angina    Bilateral hand numbness    from back surgery   Bronchitis, chronic (HCC)    Cancer ([place])    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; Merkel cell cancer, and Basal cell cancer (twice; 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   Carotid stenosis    CHF (congestive heart failure) (HCC)    CKD (chronic kidney disease)    CKD (chronic kidney disease) stage 3, GFR 30-59 ml/min (HCC)    COPD (chronic obstructive pulmonary disease) ([place])    stage 2   DDD (degenerative disc disease), cervical    Dysrhythmia    post carotid stent bradycardia; PAF 09/2020   GERD (gastroesophageal reflux disease)    Hypercholesterolemia    Hypertension    Hypothyroidism    pt takes Levothyroxine daily   Lumbosacral spinal stenosis    Myasthenia gravis, adult form ([place])    PAD (peripheral artery disease) ([place])    Shortness of breath    Lung MD- Dr Darlin Coco   Sleep apnea    do not use CPAP every night   Past Surgical History:  Procedure Laterality Date   ANTERIOR CERVICAL DECOMP/DISCECTOMY FUSION  07/18/2011   Procedure: ANTERIOR CERVICAL DECOMPRESSION/DISCECTOMY FUSION 2 LEVELS;  Surgeon: Cooper Render Pool;  Location: [place] [facility];  Service: Neurosurgery;   Laterality: N/A;  cervical five-six, cervical six-seven anterior cervical discectomy and fusion   BACK SURGERY     in [place]     01/2020 Right, 04/2020 Left   CARDIAC CATHETERIZATION     2005 at [facility], no stents   CAROTID PTA/STENT INTERVENTION N/A 09/17/2020   Procedure: CAROTID PTA/STENT INTERVENTION;  Surgeon: Algernon Huxley, MD;  Location: [place] CV LAB;  Service: Cardiovascular;  Laterality: N/A;   CATARACT EXTRACTION W/PHACO Left 01/06/2020   Procedure: CATARACT EXTRACTION PHACO AND INTRAOCULAR LENS PLACEMENT (IOC) ISTENT INJ LEFT 3.81  00:33.3;  Surgeon: Eulogio Bear, MD;  Location: [place];  Service: Ophthalmology;  Laterality: Left;   CATARACT EXTRACTION W/PHACO Right 02/03/2020   Procedure: CATARACT EXTRACTION PHACO AND INTRAOCULAR LENS PLACEMENT ([place]) RIGHT ISTENT INJ;  Surgeon: Eulogio Bear, MD;  Location: [place];  Service: Ophthalmology;  Laterality: Right;  4.29 0:35.6   COLONOSCOPY     HERNIA REPAIR Left    inguinal hernia repair in 1985   LUMBAR LAMINECTOMY/DECOMPRESSION MICRODISCECTOMY Left 02/24/2014   Procedure: LUMBAR LAMINECTOMY/DECOMPRESSION MICRODISCECTOMY LUMBAR THREE-FOUR, FOUR-FIVE, LEFT FIVE-SACRAL ONE ;  Surgeon:  Charlie Pitter, MD;  Location: [place] [facility];  Service: Neurosurgery;  Laterality: Left;  LUMBAR LAMINECTOMY/DECOMPRESSION MICRODISCECTOMY LUMBAR THREE-FOUR, FOUR-FIVE, LEFT FIVE-SACRAL ONE    LUMBAR LAMINECTOMY/DECOMPRESSION MICRODISCECTOMY N/A 05/03/2021   Procedure: Laminectomy and Foraminotomy - L2-L3;  Surgeon: Earnie Larsson, MD;  Location: [place];  Service: Neurosurgery;  Laterality: N/A;  3C   POSTERIOR CERVICAL FUSION/FORAMINOTOMY N/A 08/07/2020   Procedure: C3-6 POSTERIOR FUSION WITH DECOMPRESSION;  Surgeon: Meade Maw, MD;  Location: [facility] ORS;  Service: Neurosurgery;  Laterality: N/A;   PROSTATECTOMY  8/14   [facility] Dr Mare Ferrari    Patient Active Problem List    Diagnosis Date Noted   SVT (supraventricular tachycardia) 12/30/2021   Hypothyroidism 12/30/2021   Parkinson's disease 12/30/2021   Elevated troponin 12/30/2021   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   Acquired thrombophilia ([place]) 01/13/2021   History of decompression of median nerve 01/01/2021   Paroxysmal atrial fibrillation ([place]) 10/06/2020   Carotid stenosis, right 09/17/2020   S/P cervical spinal fusion    Leukocytosis    Essential hypertension    Anemia of chronic disease    Postoperative pain    Neuropathic pain    Cervical myelopathy ([place]) 08/07/2020   Preop cardiovascular exam 07/17/2020   SOB (shortness of breath) on exertion 07/17/2020   Leg weakness, bilateral 07/13/2020   Aortic atherosclerosis ([place]) 07/09/2020   Body mass index (BMI) 34.0-34.9, adult 12/25/2019   Myalgia 10/30/2019   Lumbar post-laminectomy syndrome 10/24/2019   PAD (peripheral artery disease) ([place]) 06/06/2019   CKD (chronic kidney disease) stage 3, GFR 30-59 ml/min (HCC) 04/29/2019   B12 deficiency 01/23/2019   Left arm numbness 02/28/2018   Neck pain 02/28/2018   Anemia 02/02/2017   DDD (degenerative disc disease), cervical 02/02/2017   Hypothyroid 02/02/2017   MRSA (methicillin resistant staph aureus) culture positive 02/02/2017   Nocturnal hypoxia 02/02/2017   Senile purpura ([place]) 10/03/2016   Essential hypertension, benign 09/16/2016   Bilateral carotid artery disease ([place]) 09/16/2016   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/18/2016   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   History of prostate cancer 12/21/2015   Left carpal tunnel syndrome 11/11/2015   Kidney stone on left side 07/05/2015   Myasthenia gravis ([place]) 05/26/2015   Radiculitis 01/05/2015   Long-term use of high-risk medication 10/15/2014   Persistent cough 09/10/2014   Pure hypercholesterolemia 07/25/2014   Spinal stenosis, lumbar region, with neurogenic claudication 02/24/2014   Lumbosacral stenosis  with neurogenic claudication 02/24/2014   COPD (chronic obstructive pulmonary disease) ([place]) 01/22/2014    REFERRING DIAG: balance disorder   THERAPY DIAG:  Difficulty in walking, not elsewhere classified  Muscle weakness (generalized)  Unsteadiness on feet  Other abnormalities of gait and mobility  Other lack of coordination  Chronic bilateral low back pain, unspecified whether sciatica present  Repeated falls  Cervical myelopathy (HCC)  Rationale for Evaluation and Treatment Rehabilitation  PERTINENT HISTORY: Othniel Maret is a 76 year old male referred to [facility] neuro for difficulty with balance. He was also just recently dx in May 2023 with Impingement syndrome of left shoulder (M75.42) Impingement syndrome of left shoulder (primary encounter diagnosis) Left shoulder pain, unspecified chronicity Nontraumatic rupture of left proximal biceps tendon Past medical hx includes Lumbar laminectomy 04/2021. C3-6 decompression, fusion on 11/26, myasthenia gravis, 2012 ACDF 5/6, 6/7; OSA, hypothyroidism, HTN, COPD, multiple back surgeries, 2021 carpal tunnel release.  PRECAUTIONS: Fall  SUBJECTIVE: Patient reports sleeping better and overall having a better day today.    PAIN:  Are you having pain? No   TODAY'S TREATMENT: 08/09/2022   THEREX:   Nustep LE Only for muscle strength and endurance- L1 for 6 min- 0.20 mi  Resistive Gait-forward x 6 at 7.5 # with use of quad cane.    Seated LAQ 3 # each LE-2 sets x 10-12 reps   Seated hip march 3#- 2 sets of 10 reps each LE   NMR:  Standing balance:  - varying foot position- on airex pad- feet wide; feet narrowed and feet staggered while playing word games on dry erase board x 10 min. Patient presents with slightly forward flexed posture and primarily using ankle righting reaction. Standing step tap onto 6 " block with 1 UE support x 12 reps x 1 set  Dynamic walking in clinic with 3# AW using SBQC approx 110 feet- Short  reciprocal step yet decreased step height and length- shuffling toward end of walk with increased VC for gait sequencing.    PATIENT EDUCATION: Education details: Goals, plan, exercise technique, body mechanics, energy conservation techniques  Person educated: Patient Education method: Explanation, Demonstration, Tactile cues, Verbal cues, and Handouts Education comprehension: verbalized understanding, returned demonstration, verbal cues required, and tactile cues required   HOME EXERCISE PROGRAM:  No Updates today  Access Code: [email or web address] URL: [email or web address] Date: 03/01/2022 Prepared by: Janna Arch  Exercises - Seated March  - 1 x daily - 7 x weekly - 2 sets - 10 reps - 5 hold - Seated Long Arc Quad  - 1 x daily - 7 x weekly - 2 sets - 10 reps - 5 hold - Seated Hip Abduction  - 1 x daily - 7 x weekly - 2 sets - 10 reps - 5 hold - Seated Hip Adduction Isometrics with Ball  - 1 x daily - 7 x weekly - 2 sets - 10 reps - 5 hold - Seated Gluteal Sets  - 1 x daily - 7 x weekly - 2 sets - 10 reps - 5 hold - Seated Heel Toe Raises  - 1 x daily - 7 x weekly - 2 sets - 10 reps - 5 hold LE strength and balance         PT Short Term Goals       PT SHORT TERM GOAL #1   Title Pt will be independent with initial HEP in order to improve strength and balance in order to decrease fall risk and improve function at home and work.    Baseline 02/23/2022- Patient reports not doing much as far as exercise in the home currently. 04/20/2022- Patient reports compliant with HEP and no questions at this time.; 9/7 pt indep   Time 6    Period Weeks    Status Goal met   Target Date 04/06/22              PT Long Term Goals       PT LONG TERM GOAL #1   Title Pt will be independent with final HEP in order to improve strength and balance in order to decrease fall risk and improve function at home and work. 04/20/2022- Patient reports compliant with HEP and no questions at this  time.    Baseline 02/23/2022= No formal HEP in place. 9/7: pt performing at least once a day, feels comfortable   Time 12    Period Weeks    Status GOAL MET   Target Date 08/11/2022      PT LONG TERM GOAL #2   Title  Pt will decrease 5TSTS by at least 8 seconds in order to demonstrate clinically significant improvement in LE strength.    Baseline 02/23/2022= 31.52 sec with B hands on knees; 04/20/2022= 16.08 sec with B hands on knees   Time 12    Period Days    Status Goal Met   Target Date 05/18/22      PT LONG TERM GOAL #3   Title Pt will increase 10MWT by at least 0.23 m/s in order to demonstrate clinically significant improvement in community ambulation.    Baseline 02/23/2022= 0.39 m/s using RW; 0.45 m/s; 9/7: 0.37 m/s with QC, 0.74 m/s with RW   Time 12    Period Weeks    Status GOAL MET   Target Date 05/18/22      PT LONG TERM GOAL #4   Title Pt will improve FOTO to target score of 50 to display perceived improvements in ability to complete ADL's.    Baseline 02/23/2022= 47; 9/7: 49   Time 12    Period Weeks    Status Partially met     Target Date 08/11/2022      PT LONG TERM GOAL #5   Title Pt will decrease TUG to below 20 seconds/decrease in order to demonstrate decreased fall risk.    Baseline 02/23/2022=28.27 sec with RW; 04/20/2022= 20.22 with SBQC; 9/7: 14.4 sec with RW, 18.5 sec with SBQC   Time 12    Period Weeks    Status MET   Target Date 05/18/22    PT LONG TERM GOAL #6  Title Pt will decrease TUG to 14 sec or below with SBQC in order to demonstrate decreased fall risk.   Baseline  9/7: 14.4 sec with RW, 18.5 sec with SBQC  Time 12   Period Weeks   Status NEW  Target Date 08/11/2022   PT LONG TERM GOAL #7  Title Patient will increase six minute walk test distance to >700 ft for improved gait ability and increased ease with community participation.  Baseline 9/7: completes 4 minutes and 444 ft with RW. Test discontinued at 4 minutes due to [place].   Time 12    Period Weeks   Status NEW  Target Date 08/11/2022              Plan -     Clinical Impression Statement Patient presented with good motivation and reported feeling better this week. He started off well - able to introduce resistive gait and later able to stand for several min without rest break performing dynamic balance activities. He was challenged with decreased LE strength and endurance and fatigued quickly with walking at end of session.  Pt will continue to benefit from skilled physical therapy intervention to address impairments, improve QOL, and attain therapy goals.     Personal Factors and Comorbidities Age;Past/Current Experience;Time since onset of injury/illness/exacerbation;Comorbidity 3+    Comorbidities COPD, History of Cancer, myasthenia gravis, chronic lumbar surgical history    Examination-Activity Limitations Lift;Squat;Bend;Stairs;Stand;Transfers;Insurance claims handler;Bathing;Hygiene/Grooming;Dressing;Toileting;Bed Mobility;Caring for Others;Continence    Examination-Participation Restrictions Yard Work;Church;Cleaning;Driving;Community Activity    Stability/Clinical Decision Making Evolving/Moderate complexity    Rehab Potential Good    Clinical Impairments Affecting Rehab Potential multipe comorbidities    PT Frequency 2x / week    PT Duration 12 weeks    PT Treatment/Interventions ADLs/Self Care Home Management;Electrical Stimulation;Moist Heat;Traction;Ultrasound;Gait training;Stair training;Functional mobility training;Therapeutic activities;Therapeutic exercise;Balance training;Neuromuscular re-education;Patient/family education;Manual techniques;Passive range of motion;Dry needling;Joint Manipulations;Cryotherapy;Vestibular;Canalith Repostioning    PT Next Visit Plan standing  strengthening and balance, seated strengthening and coordination    PT Home Exercise Plan Progress LE strengthen and functional endurance, continue plan   Consulted and  Agree with Plan of Care Patient             Lewis Moccasin PT  08/09/22, 11:15 AM

## 2022-08-10 NOTE — Therapy (Signed)
OUTPATIENT PHYSICAL THERAPY TREATMENT NOTE/RECERTIFICATION    Patient Name: Francisco [name] MRN: [id number] DOB:05/01/1946, 76 y.o., male Today's Date: 08/11/2022  PCP: Adin Hector, MD REFERRING PROVIDER: Meade Maw MD    PT End of Session - 08/11/22 1108     Visit Number 33    Number of Visits 8    Date for PT Re-Evaluation 11/03/22    Authorization Time Period 08/11/22-11/03/2022    Progress Note Due on Visit 2    PT Start Time 1015    PT Stop Time 1059    PT Time Calculation (min) 44 min    Equipment Utilized During Treatment Gait belt    Activity Tolerance Patient tolerated treatment well    Behavior During Therapy WFL for tasks assessed/performed                           Past Medical History:  Diagnosis Date   Arthritis    lower left hip   Atypical angina    Bilateral hand numbness    from back surgery   Bronchitis, chronic (HCC)    Cancer ([place])    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; Merkel cell cancer, and Basal cell cancer (twice; 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   Carotid stenosis    CHF (congestive heart failure) (HCC)    CKD (chronic kidney disease)    CKD (chronic kidney disease) stage 3, GFR 30-59 ml/min (HCC)    COPD (chronic obstructive pulmonary disease) ([place])    stage 2   DDD (degenerative disc disease), cervical    Dysrhythmia    post carotid stent bradycardia; PAF 09/2020   GERD (gastroesophageal reflux disease)    Hypercholesterolemia    Hypertension    Hypothyroidism    pt takes Levothyroxine daily   Lumbosacral spinal stenosis    Myasthenia gravis, adult form ([place])    PAD (peripheral artery disease) ([place])    Shortness of breath    Lung MD- Dr Darlin Coco   Sleep apnea    do not use CPAP every night   Past Surgical History:  Procedure Laterality Date   ANTERIOR CERVICAL DECOMP/DISCECTOMY FUSION  07/18/2011   Procedure: ANTERIOR CERVICAL DECOMPRESSION/DISCECTOMY FUSION 2 LEVELS;  Surgeon: Cooper Render Pool;  Location: [facility];  Service: Neurosurgery;  Laterality: N/A;  cervical five-six, cervical six-seven anterior cervical discectomy and fusion   BACK SURGERY     in [place]     01/2020 Right, 04/2020 Left   CARDIAC CATHETERIZATION     2005 at [facility], no stents   CAROTID PTA/STENT INTERVENTION N/A 09/17/2020   Procedure: CAROTID PTA/STENT INTERVENTION;  Surgeon: Algernon Huxley, MD;  Location: [facility];  Service: Cardiovascular;  Laterality: N/A;   CATARACT EXTRACTION W/PHACO Left 01/06/2020   Procedure: CATARACT EXTRACTION PHACO AND INTRAOCULAR LENS PLACEMENT (IOC) ISTENT INJ LEFT 3.81  00:33.3;  Surgeon: Eulogio Bear, MD;  Location: [facility];  Service: Ophthalmology;  Laterality: Left;   CATARACT EXTRACTION W/PHACO Right 02/03/2020   Procedure: CATARACT EXTRACTION PHACO AND INTRAOCULAR LENS PLACEMENT ([place]) RIGHT ISTENT INJ;  Surgeon: Eulogio Bear, MD;  Location: [facility];  Service: Ophthalmology;  Laterality: Right;  4.29 0:35.6   COLONOSCOPY     HERNIA REPAIR Left    inguinal hernia repair in [place] MICRODISCECTOMY Left 02/24/2014   Procedure: LUMBAR LAMINECTOMY/DECOMPRESSION MICRODISCECTOMY LUMBAR THREE-FOUR, FOUR-FIVE, LEFT FIVE-SACRAL ONE ;  Surgeon: Charlie Pitter, MD;  Location: [facility];  Service: Neurosurgery;  Laterality: Left;  LUMBAR LAMINECTOMY/DECOMPRESSION MICRODISCECTOMY LUMBAR THREE-FOUR, FOUR-FIVE, LEFT FIVE-SACRAL ONE    LUMBAR LAMINECTOMY/DECOMPRESSION MICRODISCECTOMY N/A 05/03/2021   Procedure: Laminectomy and Foraminotomy - L2-L3;  Surgeon: Earnie Larsson, MD;  Location: [facility];  Service: Neurosurgery;  Laterality: N/A;  3C   POSTERIOR CERVICAL FUSION/FORAMINOTOMY N/A 08/07/2020   Procedure: C3-6 POSTERIOR FUSION WITH DECOMPRESSION;  Surgeon: Meade Maw, MD;  Location: [facility] ORS;  Service: Neurosurgery;  Laterality: N/A;   PROSTATECTOMY  8/14   [facility] Dr Mare Ferrari     Patient Active Problem List   Diagnosis Date Noted   SVT (supraventricular tachycardia) 12/30/2021   Hypothyroidism 12/30/2021   Parkinson's disease 12/30/2021   Elevated troponin 12/30/2021   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   Acquired thrombophilia ([place]) 01/13/2021   History of decompression of median nerve 01/01/2021   Paroxysmal atrial fibrillation ([place]) 10/06/2020   Carotid stenosis, right 09/17/2020   S/P cervical spinal fusion    Leukocytosis    Essential hypertension    Anemia of chronic disease    Postoperative pain    Neuropathic pain    Cervical myelopathy ([place]) 08/07/2020   Preop cardiovascular exam 07/17/2020   SOB (shortness of breath) on exertion 07/17/2020   Leg weakness, bilateral 07/13/2020   Aortic atherosclerosis ([place]) 07/09/2020   Body mass index (BMI) 34.0-34.9, adult 12/25/2019   Myalgia 10/30/2019   Lumbar post-laminectomy syndrome 10/24/2019   PAD (peripheral artery disease) ([place]) 06/06/2019   CKD (chronic kidney disease) stage 3, GFR 30-59 ml/min (HCC) 04/29/2019   B12 deficiency 01/23/2019   Left arm numbness 02/28/2018   Neck pain 02/28/2018   Anemia 02/02/2017   DDD (degenerative disc disease), cervical 02/02/2017   Hypothyroid 02/02/2017   MRSA (methicillin resistant staph aureus) culture positive 02/02/2017   Nocturnal hypoxia 02/02/2017   Senile purpura ([place]) 10/03/2016   Essential hypertension, benign 09/16/2016   Bilateral carotid artery disease ([place]) 09/16/2016   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/18/2016   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   History of prostate cancer 12/21/2015   Left carpal tunnel syndrome 11/11/2015   Kidney stone on left side 07/05/2015   Myasthenia gravis ([place]) 05/26/2015   Radiculitis 01/05/2015   Long-term use of high-risk medication 10/15/2014   Persistent cough 09/10/2014   Pure hypercholesterolemia 07/25/2014   Spinal stenosis, lumbar region, with neurogenic claudication  02/24/2014   Lumbosacral stenosis with neurogenic claudication 02/24/2014   COPD (chronic obstructive pulmonary disease) ([place]) 01/22/2014    REFERRING DIAG: balance disorder   THERAPY DIAG:  Difficulty in walking, not elsewhere classified - Plan: PT plan of care cert/re-cert  Muscle weakness (generalized) - Plan: PT plan of care cert/re-cert  Unsteadiness on feet - Plan: PT plan of care cert/re-cert  Other abnormalities of gait and mobility - Plan: PT plan of care cert/re-cert  Other lack of coordination - Plan: PT plan of care cert/re-cert  Chronic bilateral low back pain, unspecified whether sciatica present - Plan: PT plan of care cert/re-cert  Repeated falls - Plan: PT plan of care cert/re-cert  Rationale for Evaluation and Treatment Rehabilitation  PERTINENT HISTORY: Sorren Vallier is a 76 year old male referred to [facility] neuro for difficulty with balance. He was also just recently dx in May 2023 with Impingement syndrome of left shoulder (M75.42) Impingement syndrome of left shoulder (primary encounter diagnosis) Left shoulder pain, unspecified chronicity Nontraumatic rupture of left proximal biceps tendon Past medical hx  includes Lumbar laminectomy 04/2021. C3-6 decompression, fusion on 11/26, myasthenia gravis, 2012 ACDF 5/6, 6/7; OSA, hypothyroidism, HTN, COPD, multiple back surgeries, 2021 carpal tunnel release.  PRECAUTIONS: Fall  SUBJECTIVE:     PAIN:  Are you having pain? No   TODAY'S TREATMENT: 08/11/2022   THEREX:   Nustep LE Only for muscle strength and endurance- L1 for 6 min- 0.20 mi  Reassessed all goals for recert visit today- Please refer to goal section for details.     PATIENT EDUCATION: Education details: Goals, plan, exercise technique, body mechanics, energy conservation techniques  Person educated: Patient Education method: Explanation, Demonstration, Tactile cues, Verbal cues, and Handouts Education comprehension: verbalized understanding,  returned demonstration, verbal cues required, and tactile cues required   HOME EXERCISE PROGRAM:  No Updates today  Access Code: [email or web address] URL: [email or web address] Date: 03/01/2022 Prepared by: Janna Arch  Exercises - Seated March  - 1 x daily - 7 x weekly - 2 sets - 10 reps - 5 hold - Seated Long Arc Quad  - 1 x daily - 7 x weekly - 2 sets - 10 reps - 5 hold - Seated Hip Abduction  - 1 x daily - 7 x weekly - 2 sets - 10 reps - 5 hold - Seated Hip Adduction Isometrics with Ball  - 1 x daily - 7 x weekly - 2 sets - 10 reps - 5 hold - Seated Gluteal Sets  - 1 x daily - 7 x weekly - 2 sets - 10 reps - 5 hold - Seated Heel Toe Raises  - 1 x daily - 7 x weekly - 2 sets - 10 reps - 5 hold LE strength and balance         PT Short Term Goals       PT SHORT TERM GOAL #1   Title Pt will be independent with initial HEP in order to improve strength and balance in order to decrease fall risk and improve function at home and work.    Baseline 02/23/2022- Patient reports not doing much as far as exercise in the home currently. 04/20/2022- Patient reports compliant with HEP and no questions at this time.; 9/7 pt indep   Time 6    Period Weeks    Status Goal met   Target Date 04/06/22              PT Long Term Goals       PT LONG TERM GOAL #1   Title Pt will be independent with final HEP in order to improve strength and balance in order to decrease fall risk and improve function at home and work. 04/20/2022- Patient reports compliant with HEP and no questions at this time.    Baseline 02/23/2022= No formal HEP in place. 9/7: pt performing at least once a day, feels comfortable   Time 12    Period Weeks    Status GOAL MET   Target Date 08/11/2022      PT LONG TERM GOAL #2   Title Pt will decrease 5TSTS by at least 8 seconds in order to demonstrate clinically significant improvement in LE strength.    Baseline 02/23/2022= 31.52 sec with B hands on knees; 04/20/2022= 16.08  sec with B hands on knees   Time 12    Period Days    Status Goal Met   Target Date 05/18/22      PT LONG TERM GOAL #3   Title Pt will increase 10MWT by at least 0.23 m/s  in order to demonstrate clinically significant improvement in community ambulation.    Baseline 02/23/2022= 0.39 m/s using RW; 0.45 m/s; 9/7: 0.37 m/s with QC, 0.74 m/s with RW   Time 12    Period Weeks    Status GOAL MET   Target Date 05/18/22      PT LONG TERM GOAL #4   Title Pt will improve FOTO to target score of 50 to display perceived improvements in ability to complete ADL's.    Baseline 02/23/2022= 47; 9/7: 49; 11/30=54%   Time 12    Period Weeks    Status GOAL MET   Target Date 08/11/2022      PT LONG TERM GOAL #5   Title Pt will decrease TUG to below 20 seconds/decrease in order to demonstrate decreased fall risk.    Baseline 02/23/2022=28.27 sec with RW; 04/20/2022= 20.22 with SBQC; 9/7: 14.4 sec with RW, 18.5 sec with SBQC   Time 12    Period Weeks    Status MET   Target Date 05/18/22    PT LONG TERM GOAL #6  Title Pt will decrease TUG to 14 sec or below with SBQC in order to demonstrate decreased fall risk.   Baseline  9/7: 14.4 sec with RW, 18.5 sec with SBQC; 11/30=16.85 sec without an AD.   Time 12   Period Weeks   Status PROGRESSING  Target Date 11/05/2022   PT LONG TERM GOAL #7  Title Patient will increase six minute walk test distance to >700 ft for improved gait ability and increased ease with community participation.  Baseline 9/7: completes 4 minutes and 444 ft with RW. Test discontinued at 4 minutes due to [place]. 08/11/2022= patient ambulated 200 feet yet required 1 seated rest break- due to BLE fatigue- using SBQC  Time 12   Period Weeks   Status ONGOING  Target Date 11/03/2022   PT LONG TERM GOAL #8  Title Pt will decrease 5TSTS by at least 2 seconds (without UE support)  in order to demonstrate clinically significant improvement in LE strength.   Baseline 08/11/2022= 16.33 sec  with B hands on knees  Time 12   Period Weeks  Status NEW  Target Date 11/03/2022            Plan -     Clinical Impression Statement Patient presents with excellent motivation for today's session. He has struggled overall more this certification and increased LE fatigue and limited walking. He admitted today was not a great day for testing but performed his best. Started testing with 6 min walk test as to not attempt later in visit once his legs may be too fatigued to try. He struggled walking 200 feet with 1 seated rest break. He did better today with shorter bouts of walking exhibiting improved TUG score and later performed 5 x Sit to stand to add a new strength goal. Tested walking with different canes today and patient performed better with hurrycane vs. His short based cane and to contemplate switching.  Patient's condition has the potential to improve in response to therapy. Maximum improvement is yet to be obtained. The anticipated improvement is attainable and reasonable in a generally predictable time.  Patient will continue to benefit from skilled physical therapy intervention to address impairments, improve QOL, and attain therapy goals.     Personal Factors and Comorbidities Age;Past/Current Experience;Time since onset of injury/illness/exacerbation;Comorbidity 3+    Comorbidities COPD, History of Cancer, myasthenia gravis, chronic lumbar surgical history    Examination-Activity  Limitations Lift;Squat;Bend;Stairs;Stand;Transfers;Insurance claims handler;Bathing;Hygiene/Grooming;Dressing;Toileting;Bed Mobility;Caring for Others;Continence    Examination-Participation Restrictions Yard Work;Church;Cleaning;Driving;Community Activity    Stability/Clinical Decision Making Evolving/Moderate complexity    Rehab Potential Good    Clinical Impairments Affecting Rehab Potential multipe comorbidities    PT Frequency 2x / week    PT Duration 12 weeks    PT  Treatment/Interventions ADLs/Self Care Home Management;Electrical Stimulation;Moist Heat;Traction;Ultrasound;Gait training;Stair training;Functional mobility training;Therapeutic activities;Therapeutic exercise;Balance training;Neuromuscular re-education;Patient/family education;Manual techniques;Passive range of motion;Dry needling;Joint Manipulations;Cryotherapy;Vestibular;Canalith Repostioning    PT Next Visit Plan standing strengthening and balance, seated strengthening and coordination    PT Home Exercise Plan Progress LE strengthen and functional endurance, continue plan   Consulted and Agree with Plan of Care Patient             Lewis Moccasin PT  08/11/22, 11:33 AM

## 2022-08-11 ENCOUNTER — Ambulatory Visit: Payer: Medicare Other

## 2022-08-11 DIAGNOSIS — R2681 Unsteadiness on feet: Secondary | ICD-10-CM

## 2022-08-11 DIAGNOSIS — R278 Other lack of coordination: Secondary | ICD-10-CM

## 2022-08-11 DIAGNOSIS — M545 Low back pain, unspecified: Secondary | ICD-10-CM

## 2022-08-11 DIAGNOSIS — R2689 Other abnormalities of gait and mobility: Secondary | ICD-10-CM

## 2022-08-11 DIAGNOSIS — R262 Difficulty in walking, not elsewhere classified: Secondary | ICD-10-CM | POA: Diagnosis not present

## 2022-08-11 DIAGNOSIS — M6281 Muscle weakness (generalized): Secondary | ICD-10-CM

## 2022-08-11 DIAGNOSIS — R296 Repeated falls: Secondary | ICD-10-CM

## 2022-08-16 ENCOUNTER — Ambulatory Visit: Payer: Medicare Other | Attending: Neurosurgery

## 2022-08-16 DIAGNOSIS — R262 Difficulty in walking, not elsewhere classified: Secondary | ICD-10-CM | POA: Diagnosis present

## 2022-08-16 DIAGNOSIS — G959 Disease of spinal cord, unspecified: Secondary | ICD-10-CM | POA: Insufficient documentation

## 2022-08-16 DIAGNOSIS — M5442 Lumbago with sciatica, left side: Secondary | ICD-10-CM | POA: Diagnosis present

## 2022-08-16 DIAGNOSIS — R2689 Other abnormalities of gait and mobility: Secondary | ICD-10-CM | POA: Diagnosis present

## 2022-08-16 DIAGNOSIS — M6281 Muscle weakness (generalized): Secondary | ICD-10-CM | POA: Insufficient documentation

## 2022-08-16 DIAGNOSIS — G8929 Other chronic pain: Secondary | ICD-10-CM | POA: Diagnosis present

## 2022-08-16 DIAGNOSIS — R269 Unspecified abnormalities of gait and mobility: Secondary | ICD-10-CM | POA: Diagnosis present

## 2022-08-16 DIAGNOSIS — R296 Repeated falls: Secondary | ICD-10-CM | POA: Insufficient documentation

## 2022-08-16 DIAGNOSIS — R2681 Unsteadiness on feet: Secondary | ICD-10-CM | POA: Diagnosis present

## 2022-08-16 DIAGNOSIS — M545 Low back pain, unspecified: Secondary | ICD-10-CM | POA: Diagnosis present

## 2022-08-16 DIAGNOSIS — R278 Other lack of coordination: Secondary | ICD-10-CM | POA: Diagnosis present

## 2022-08-16 NOTE — Therapy (Signed)
OUTPATIENT PHYSICAL THERAPY TREATMENT NOTE      Patient Name: Francisco [name] MRN: [id number] DOB:09-29-1945, 76 y.o., male Today's Date: 08/16/2022  PCP: Adin Hector, MD REFERRING PROVIDER: Meade Maw MD   PT End of Session - 08/16/22 1516     Visit Number 34    Number of Visits 55    Date for PT Re-Evaluation 11/03/22    Authorization Time Period 08/11/22-11/03/2022    Progress Note Due on Visit 94    PT Start Time 1517    PT Stop Time 1600    PT Time Calculation (min) 43 min    Equipment Utilized During Treatment Gait belt    Activity Tolerance Patient tolerated treatment well    Behavior During Therapy WFL for tasks assessed/performed              Past Medical History:  Diagnosis Date   Arthritis    lower left hip   Atypical angina    Bilateral hand numbness    from back surgery   Bronchitis, chronic (HCC)    Cancer ([place])    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; Merkel cell cancer, and Basal cell cancer (twice; 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   Carotid stenosis    CHF (congestive heart failure) (HCC)    CKD (chronic kidney disease)    CKD (chronic kidney disease) stage 3, GFR 30-59 ml/min (HCC)    COPD (chronic obstructive pulmonary disease) ([place])    stage 2   DDD (degenerative disc disease), cervical    Dysrhythmia    post carotid stent bradycardia; PAF 09/2020   GERD (gastroesophageal reflux disease)    Hypercholesterolemia    Hypertension    Hypothyroidism    pt takes Levothyroxine daily   Lumbosacral spinal stenosis    Myasthenia gravis, adult form ([place])    PAD (peripheral artery disease) ([place])    Shortness of breath    Lung MD- Dr Darlin Coco   Sleep apnea    do not use CPAP every night   Past Surgical History:  Procedure Laterality Date   ANTERIOR CERVICAL DECOMP/DISCECTOMY FUSION  07/18/2011   Procedure: ANTERIOR CERVICAL DECOMPRESSION/DISCECTOMY FUSION 2 LEVELS;  Surgeon: Cooper Render Pool;  Location: [facility];  Service: Neurosurgery;   Laterality: N/A;  cervical five-six, cervical six-seven anterior cervical discectomy and fusion   BACK SURGERY     in [place]     01/2020 Right, 04/2020 Left   CARDIAC CATHETERIZATION     2005 at [facility], no stents   CAROTID PTA/STENT INTERVENTION N/A 09/17/2020   Procedure: CAROTID PTA/STENT INTERVENTION;  Surgeon: Algernon Huxley, MD;  Location: [facility];  Service: Cardiovascular;  Laterality: N/A;   CATARACT EXTRACTION W/PHACO Left 01/06/2020   Procedure: CATARACT EXTRACTION PHACO AND INTRAOCULAR LENS PLACEMENT (I[facility]) ISTENT INJ LEFT 3.81  00:33.3;  Surgeon: Eulogio Bear, MD;  Location: [place];  Service: Ophthalmology;  Laterality: Left;   CATARACT EXTRACTION W/PHACO Right 02/03/2020   Procedure: CATARACT EXTRACTION PHACO AND INTRAOCULAR LENS PLACEMENT ([place]) RIGHT ISTENT INJ;  Surgeon: Eulogio Bear, MD;  Location: [place];  Service: Ophthalmology;  Laterality: Right;  4.29 0:35.6   COLONOSCOPY     HERNIA REPAIR Left    inguinal hernia repair in 1985   LUMBAR LAMINECTOMY/DECOMPRESSION MICRODISCECTOMY Left 02/24/2014   Procedure: LUMBAR LAMINECTOMY/DECOMPRESSION MICRODISCECTOMY LUMBAR THREE-FOUR, FOUR-FIVE, LEFT FIVE-SACRAL ONE ;  Surgeon: Charlie Pitter, MD;  Location: [facility];  Service: Neurosurgery;  Laterality: Left;  LUMBAR LAMINECTOMY/DECOMPRESSION MICRODISCECTOMY LUMBAR THREE-FOUR, FOUR-FIVE, LEFT FIVE-SACRAL ONE    LUMBAR LAMINECTOMY/DECOMPRESSION MICRODISCECTOMY N/A 05/03/2021   Procedure: Laminectomy and Foraminotomy - L2-L3;  Surgeon: Earnie Larsson, MD;  Location: [facility];  Service: Neurosurgery;  Laterality: N/A;  3C   POSTERIOR CERVICAL FUSION/FORAMINOTOMY N/A 08/07/2020   Procedure: C3-6 POSTERIOR FUSION WITH DECOMPRESSION;  Surgeon: Meade Maw, MD;  Location: [facility];  Service: Neurosurgery;  Laterality: N/A;   PROSTATECTOMY  8/14   [facility] Dr Mare Ferrari    Patient Active Problem List    Diagnosis Date Noted   SVT (supraventricular tachycardia) 12/30/2021   Hypothyroidism 12/30/2021   Parkinson's disease 12/30/2021   Elevated troponin 12/30/2021   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   Acquired thrombophilia ([place]) 01/13/2021   History of decompression of median nerve 01/01/2021   Paroxysmal atrial fibrillation ([place]) 10/06/2020   Carotid stenosis, right 09/17/2020   S/P cervical spinal fusion    Leukocytosis    Essential hypertension    Anemia of chronic disease    Postoperative pain    Neuropathic pain    Cervical myelopathy ([place]) 08/07/2020   Preop cardiovascular exam 07/17/2020   SOB (shortness of breath) on exertion 07/17/2020   Leg weakness, bilateral 07/13/2020   Aortic atherosclerosis ([place]) 07/09/2020   Body mass index (BMI) 34.0-34.9, adult 12/25/2019   Myalgia 10/30/2019   Lumbar post-laminectomy syndrome 10/24/2019   PAD (peripheral artery disease) ([place]) 06/06/2019   CKD (chronic kidney disease) stage 3, GFR 30-59 ml/min (HCC) 04/29/2019   B12 deficiency 01/23/2019   Left arm numbness 02/28/2018   Neck pain 02/28/2018   Anemia 02/02/2017   DDD (degenerative disc disease), cervical 02/02/2017   Hypothyroid 02/02/2017   MRSA (methicillin resistant staph aureus) culture positive 02/02/2017   Nocturnal hypoxia 02/02/2017   Senile purpura ([place]) 10/03/2016   Essential hypertension, benign 09/16/2016   Bilateral carotid artery disease ([place]) 09/16/2016   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/18/2016   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   History of prostate cancer 12/21/2015   Left carpal tunnel syndrome 11/11/2015   Kidney stone on left side 07/05/2015   Myasthenia gravis ([place]) 05/26/2015   Radiculitis 01/05/2015   Long-term use of high-risk medication 10/15/2014   Persistent cough 09/10/2014   Pure hypercholesterolemia 07/25/2014   Spinal stenosis, lumbar region, with neurogenic claudication 02/24/2014   Lumbosacral stenosis  with neurogenic claudication 02/24/2014   COPD (chronic obstructive pulmonary disease) ([place]) 01/22/2014    REFERRING DIAG: balance disorder   THERAPY DIAG:  Difficulty in walking, not elsewhere classified  Muscle weakness (generalized)  Unsteadiness on feet  Other abnormalities of gait and mobility  Other lack of coordination  Chronic bilateral low back pain, unspecified whether sciatica present  Repeated falls  Cervical myelopathy (HCC)  Abnormality of gait and mobility  Rationale for Evaluation and Treatment Rehabilitation  PERTINENT HISTORY: Francisco [name] is a 76 year old male referred to [facility] neuro for difficulty with balance. He was also just recently dx in May 2023 with Impingement syndrome of left shoulder (M75.42) Impingement syndrome of left shoulder (primary encounter diagnosis) Left shoulder pain, unspecified chronicity Nontraumatic rupture of left proximal biceps tendon Past medical hx includes Lumbar laminectomy 04/2021. C3-6 decompression, fusion on 11/26, myasthenia gravis, 2012 ACDF 5/6, 6/7; OSA, hypothyroidism, HTN, COPD, multiple back surgeries, 2021 carpal tunnel release.  PRECAUTIONS: Fall  SUBJECTIVE:  I am feeling pretty good. No pain. The nerve pill my MD prescribed is working   PAIN:  Are you having pain? No   TODAY'S TREATMENT: 08/15/2022   NMR:   Step tap onto 6" block x 10 alt LE (no UE Support) - mild unsteadiness- improved with practice Step up onto 6" block x 10 alt LE  (1 UE support) - Fatigue limited upon completion.   Obstacle course 1) Start in // bars- step up onto 6" block then down back to floor- then step over 1/2 foam x 3 to end of bars- walk out and then step up onto airex pad - perform 10 high knee marches without UE support - then step onto stepper (positioned long way) - for narrowed walk then off and tandem gait between 6 cones positioned narrow x 3 trials.  Obstacle course 2) Start in // bars - Step up onto 6" block then  down back to floor then onto airex pad - perform 10 high knee marches (without UE support) then step up on edge of A/P positioned rockerboard- see saw over and then perform step up onto small inclines positioned on left and right side- then step up and over stepper and tandem gait between 6 cones positioned narrow x 3  Cone activity-  1) Side shuffle+forward walk around orange hurdle and cones x 6-  3 trials 2) Retro +side shuffle around orange hurdle and cones x 6 - 3 trials (VC for step length for retro gait)     PATIENT EDUCATION: Education details: Goals, plan, exercise technique, body mechanics, energy conservation techniques  Person educated: Patient Education method: Explanation, Demonstration, Tactile cues, Verbal cues, and Handouts Education comprehension: verbalized understanding, returned demonstration, verbal cues required, and tactile cues required   HOME EXERCISE PROGRAM:  No Updates today  Access Code: [email or web address] URL: [email or web address] Date: 03/01/2022 Prepared by: Janna Arch  Exercises - Seated March  - 1 x daily - 7 x weekly - 2 sets - 10 reps - 5 hold - Seated Long Arc Quad  - 1 x daily - 7 x weekly - 2 sets - 10 reps - 5 hold - Seated Hip Abduction  - 1 x daily - 7 x weekly - 2 sets - 10 reps - 5 hold - Seated Hip Adduction Isometrics with Ball  - 1 x daily - 7 x weekly - 2 sets - 10 reps - 5 hold - Seated Gluteal Sets  - 1 x daily - 7 x weekly - 2 sets - 10 reps - 5 hold - Seated Heel Toe Raises  - 1 x daily - 7 x weekly - 2 sets - 10 reps - 5 hold LE strength and balance         PT Short Term Goals       PT SHORT TERM GOAL #1   Title Pt will be independent with initial HEP in order to improve strength and balance in order to decrease fall risk and improve function at home and work.    Baseline 02/23/2022- Patient reports not doing much as far as exercise in the home currently. 04/20/2022- Patient reports compliant with HEP and no questions  at this time.; 9/7 pt indep   Time 6    Period Weeks    Status Goal met   Target Date 04/06/22              PT Long Term Goals       PT LONG TERM GOAL #1   Title Pt will be independent with final HEP in order to improve strength and balance in order to  decrease fall risk and improve function at home and work. 04/20/2022- Patient reports compliant with HEP and no questions at this time.    Baseline 02/23/2022= No formal HEP in place. 9/7: pt performing at least once a day, feels comfortable   Time 12    Period Weeks    Status GOAL MET   Target Date 08/11/2022      PT LONG TERM GOAL #2   Title Pt will decrease 5TSTS by at least 8 seconds in order to demonstrate clinically significant improvement in LE strength.    Baseline 02/23/2022= 31.52 sec with B hands on knees; 04/20/2022= 16.08 sec with B hands on knees   Time 12    Period Days    Status Goal Met   Target Date 05/18/22      PT LONG TERM GOAL #3   Title Pt will increase 10MWT by at least 0.23 m/s in order to demonstrate clinically significant improvement in community ambulation.    Baseline 02/23/2022= 0.39 m/s using RW; 0.45 m/s; 9/7: 0.37 m/s with QC, 0.74 m/s with RW   Time 12    Period Weeks    Status GOAL MET   Target Date 05/18/22      PT LONG TERM GOAL #4   Title Pt will improve FOTO to target score of 50 to display perceived improvements in ability to complete ADL's.    Baseline 02/23/2022= 47; 9/7: 49; 11/30=54%   Time 12    Period Weeks    Status GOAL MET   Target Date 08/11/2022      PT LONG TERM GOAL #5   Title Pt will decrease TUG to below 20 seconds/decrease in order to demonstrate decreased fall risk.    Baseline 02/23/2022=28.27 sec with RW; 04/20/2022= 20.22 with SBQC; 9/7: 14.4 sec with RW, 18.5 sec with SBQC   Time 12    Period Weeks    Status MET   Target Date 05/18/22    PT LONG TERM GOAL #6  Title Pt will decrease TUG to 14 sec or below with SBQC in order to demonstrate decreased fall risk.    Baseline  9/7: 14.4 sec with RW, 18.5 sec with SBQC; 11/30=16.85 sec without an AD.   Time 12   Period Weeks   Status PROGRESSING  Target Date 11/05/2022   PT LONG TERM GOAL #7  Title Patient will increase six minute walk test distance to >700 ft for improved gait ability and increased ease with community participation.  Baseline 9/7: completes 4 minutes and 444 ft with RW. Test discontinued at 4 minutes due to [place]. 08/11/2022= patient ambulated 200 feet yet required 1 seated rest break- due to BLE fatigue- using SBQC  Time 12   Period Weeks   Status ONGOING  Target Date 11/03/2022   PT LONG TERM GOAL #8  Title Pt will decrease 5TSTS by at least 2 seconds (without UE support)  in order to demonstrate clinically significant improvement in LE strength.   Baseline 08/11/2022= 16.33 sec with B hands on knees  Time 12   Period Weeks  Status NEW  Target Date 11/03/2022            Plan -     Clinical Impression Statement Patient presents with excellent motivation for today's session. Treatment focused on dynamic balance incorporating more hip flex and hip stabilizing muscles to improve patient overall mobility. He fatigued with all obstacle course activities yet performed well and able to stand better and incorporate more activities  overall.  Patient will continue to benefit from skilled physical therapy intervention to address impairments, improve QOL, and attain therapy goals.     Personal Factors and Comorbidities Age;Past/Current Experience;Time since onset of injury/illness/exacerbation;Comorbidity 3+    Comorbidities COPD, History of Cancer, myasthenia gravis, chronic lumbar surgical history    Examination-Activity Limitations Lift;Squat;Bend;Stairs;Stand;Transfers;Insurance claims handler;Bathing;Hygiene/Grooming;Dressing;Toileting;Bed Mobility;Caring for Others;Continence    Examination-Participation Restrictions Yard Work;Church;Cleaning;Driving;Community Activity     Stability/Clinical Decision Making Evolving/Moderate complexity    Rehab Potential Good    Clinical Impairments Affecting Rehab Potential multipe comorbidities    PT Frequency 2x / week    PT Duration 12 weeks    PT Treatment/Interventions ADLs/Self Care Home Management;Electrical Stimulation;Moist Heat;Traction;Ultrasound;Gait training;Stair training;Functional mobility training;Therapeutic activities;Therapeutic exercise;Balance training;Neuromuscular re-education;Patient/family education;Manual techniques;Passive range of motion;Dry needling;Joint Manipulations;Cryotherapy;Vestibular;Canalith Repostioning    PT Next Visit Plan standing strengthening and balance, seated strengthening and coordination    PT Home Exercise Plan Progress LE strengthen and functional endurance, continue plan   Consulted and Agree with Plan of Care Patient             Lewis Moccasin PT  08/16/22, 4:21 PM

## 2022-08-18 ENCOUNTER — Ambulatory Visit: Payer: Medicare Other

## 2022-08-18 ENCOUNTER — Other Ambulatory Visit: Payer: Self-pay | Admitting: Vascular Surgery

## 2022-08-18 DIAGNOSIS — G959 Disease of spinal cord, unspecified: Secondary | ICD-10-CM

## 2022-08-18 DIAGNOSIS — R278 Other lack of coordination: Secondary | ICD-10-CM

## 2022-08-18 DIAGNOSIS — R262 Difficulty in walking, not elsewhere classified: Secondary | ICD-10-CM | POA: Diagnosis not present

## 2022-08-18 DIAGNOSIS — R2689 Other abnormalities of gait and mobility: Secondary | ICD-10-CM

## 2022-08-18 DIAGNOSIS — R296 Repeated falls: Secondary | ICD-10-CM

## 2022-08-18 DIAGNOSIS — M545 Low back pain, unspecified: Secondary | ICD-10-CM

## 2022-08-18 DIAGNOSIS — M6281 Muscle weakness (generalized): Secondary | ICD-10-CM

## 2022-08-18 DIAGNOSIS — R269 Unspecified abnormalities of gait and mobility: Secondary | ICD-10-CM

## 2022-08-18 DIAGNOSIS — R2681 Unsteadiness on feet: Secondary | ICD-10-CM

## 2022-08-18 NOTE — Therapy (Signed)
OUTPATIENT PHYSICAL THERAPY TREATMENT NOTE      Patient Name: Francisco [name] MRN: [id number] DOB:22-Oct-1945, 76 y.o., male Today's Date: 08/19/2022  PCP: Adin Hector, MD REFERRING PROVIDER: Meade Maw MD   PT End of Session - 08/18/22 1529     Visit Number 35    Number of Visits 18    Date for PT Re-Evaluation 11/03/22    Authorization Time Period 08/11/22-11/03/2022    Progress Note Due on Visit 95    PT Start Time 1515    PT Stop Time 1559    PT Time Calculation (min) 44 min    Equipment Utilized During Treatment Gait belt    Activity Tolerance Patient tolerated treatment well    Behavior During Therapy WFL for tasks assessed/performed              Past Medical History:  Diagnosis Date   Arthritis    lower left hip   Atypical angina    Bilateral hand numbness    from back surgery   Bronchitis, chronic (HCC)    Cancer ([place])    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; Merkel cell cancer, and Basal cell cancer (twice; 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   Carotid stenosis    CHF (congestive heart failure) (HCC)    CKD (chronic kidney disease)    CKD (chronic kidney disease) stage 3, GFR 30-59 ml/min (HCC)    COPD (chronic obstructive pulmonary disease) ([place])    stage 2   DDD (degenerative disc disease), cervical    Dysrhythmia    post carotid stent bradycardia; PAF 09/2020   GERD (gastroesophageal reflux disease)    Hypercholesterolemia    Hypertension    Hypothyroidism    pt takes Levothyroxine daily   Lumbosacral spinal stenosis    Myasthenia gravis, adult form ([place])    PAD (peripheral artery disease) ([place])    Shortness of breath    Lung MD- Dr Darlin Coco   Sleep apnea    do not use CPAP every night   Past Surgical History:  Procedure Laterality Date   ANTERIOR CERVICAL DECOMP/DISCECTOMY FUSION  07/18/2011   Procedure: ANTERIOR CERVICAL DECOMPRESSION/DISCECTOMY FUSION 2 LEVELS;  Surgeon: Cooper Render Pool;  Location: [place] [facility];  Service: Neurosurgery;   Laterality: N/A;  cervical five-six, cervical six-seven anterior cervical discectomy and fusion   BACK SURGERY     in [place]     01/2020 Right, 04/2020 Left   CARDIAC CATHETERIZATION     2005 at [facility], no stents   CAROTID PTA/STENT INTERVENTION N/A 09/17/2020   Procedure: CAROTID PTA/STENT INTERVENTION;  Surgeon: Algernon Huxley, MD;  Location: [place] [facility];  Service: Cardiovascular;  Laterality: N/A;   CATARACT EXTRACTION W/PHACO Left 01/06/2020   Procedure: CATARACT EXTRACTION PHACO AND INTRAOCULAR LENS PLACEMENT (IOC) ISTENT INJ LEFT 3.81  00:33.3;  Surgeon: Eulogio Bear, MD;  Location: [place];  Service: Ophthalmology;  Laterality: Left;   CATARACT EXTRACTION W/PHACO Right 02/03/2020   Procedure: CATARACT EXTRACTION PHACO AND INTRAOCULAR LENS PLACEMENT ([place]) RIGHT ISTENT INJ;  Surgeon: Eulogio Bear, MD;  Location: [place];  Service: Ophthalmology;  Laterality: Right;  4.29 0:35.6   COLONOSCOPY     HERNIA REPAIR Left    inguinal hernia repair in 1985   LUMBAR LAMINECTOMY/DECOMPRESSION MICRODISCECTOMY Left 02/24/2014   Procedure: LUMBAR LAMINECTOMY/DECOMPRESSION MICRODISCECTOMY LUMBAR THREE-FOUR, FOUR-FIVE, LEFT FIVE-SACRAL ONE ;  Surgeon: Charlie Pitter, MD;  Location: [facility];  Service: Neurosurgery;  Laterality: Left;  LUMBAR LAMINECTOMY/DECOMPRESSION MICRODISCECTOMY LUMBAR THREE-FOUR, FOUR-FIVE, LEFT FIVE-SACRAL ONE    LUMBAR LAMINECTOMY/DECOMPRESSION MICRODISCECTOMY N/A 05/03/2021   Procedure: Laminectomy and Foraminotomy - L2-L3;  Surgeon: Earnie Larsson, MD;  Location: [place];  Service: Neurosurgery;  Laterality: N/A;  3C   POSTERIOR CERVICAL FUSION/FORAMINOTOMY N/A 08/07/2020   Procedure: C3-6 POSTERIOR FUSION WITH DECOMPRESSION;  Surgeon: Meade Maw, MD;  Location: [facility];  Service: Neurosurgery;  Laterality: N/A;   PROSTATECTOMY  8/14   [facility] Dr Mare Ferrari    Patient Active Problem List    Diagnosis Date Noted   SVT (supraventricular tachycardia) 12/30/2021   Hypothyroidism 12/30/2021   Parkinson's disease 12/30/2021   Elevated troponin 12/30/2021   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   Acquired thrombophilia ([place]) 01/13/2021   History of decompression of median nerve 01/01/2021   Paroxysmal atrial fibrillation ([place]) 10/06/2020   Carotid stenosis, right 09/17/2020   S/P cervical spinal fusion    Leukocytosis    Essential hypertension    Anemia of chronic disease    Postoperative pain    Neuropathic pain    Cervical myelopathy ([place]) 08/07/2020   Preop cardiovascular exam 07/17/2020   SOB (shortness of breath) on exertion 07/17/2020   Leg weakness, bilateral 07/13/2020   Aortic atherosclerosis ([place]) 07/09/2020   Body mass index (BMI) 34.0-34.9, adult 12/25/2019   Myalgia 10/30/2019   Lumbar post-laminectomy syndrome 10/24/2019   PAD (peripheral artery disease) ([place]) 06/06/2019   CKD (chronic kidney disease) stage 3, GFR 30-59 ml/min (HCC) 04/29/2019   B12 deficiency 01/23/2019   Left arm numbness 02/28/2018   Neck pain 02/28/2018   Anemia 02/02/2017   DDD (degenerative disc disease), cervical 02/02/2017   Hypothyroid 02/02/2017   MRSA (methicillin resistant staph aureus) culture positive 02/02/2017   Nocturnal hypoxia 02/02/2017   Senile purpura ([place]) 10/03/2016   Essential hypertension, benign 09/16/2016   Bilateral carotid artery disease ([place]) 09/16/2016   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/18/2016   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   History of prostate cancer 12/21/2015   Left carpal tunnel syndrome 11/11/2015   Kidney stone on left side 07/05/2015   Myasthenia gravis ([place]) 05/26/2015   Radiculitis 01/05/2015   Long-term use of high-risk medication 10/15/2014   Persistent cough 09/10/2014   Pure hypercholesterolemia 07/25/2014   Spinal stenosis, lumbar region, with neurogenic claudication 02/24/2014   Lumbosacral stenosis  with neurogenic claudication 02/24/2014   COPD (chronic obstructive pulmonary disease) ([place]) 01/22/2014    REFERRING DIAG: balance disorder   THERAPY DIAG:  Difficulty in walking, not elsewhere classified  Muscle weakness (generalized)  Unsteadiness on feet  Other abnormalities of gait and mobility  Other lack of coordination  Chronic bilateral low back pain, unspecified whether sciatica present  Repeated falls  Cervical myelopathy (HCC)  Abnormality of gait and mobility  Rationale for Evaluation and Treatment Rehabilitation  PERTINENT HISTORY: Caliph Borowiak is a 76 year old male referred to [facility] neuro for difficulty with balance. He was also just recently dx in May 2023 with Impingement syndrome of left shoulder (M75.42) Impingement syndrome of left shoulder (primary encounter diagnosis) Left shoulder pain, unspecified chronicity Nontraumatic rupture of left proximal biceps tendon Past medical hx includes Lumbar laminectomy 04/2021. C3-6 decompression, fusion on 11/26, myasthenia gravis, 2012 ACDF 5/6, 6/7; OSA, hypothyroidism, HTN, COPD, multiple back surgeries, 2021 carpal tunnel release.  PRECAUTIONS: Fall  SUBJECTIVE:  I am tired from doing some work in my yard this morning but otherwise doing okay  PAIN:  Are you having pain? No   TODAY'S TREATMENT: 08/18/2022   THEREX:   INTERVAL NUSTEP: LE only at seat 11 L1- 56mn 30 sec L3- 1 min L5 1 min 30 sec L1 1 min L3 1 min  RESISTIVE LE strengthening using cable system: 2 sets of 12 reps (all with 2.5 #) BLE of the following:  - Hip ext -Hip abd - Hip flex -seated knee ext     PATIENT EDUCATION: Education details: Goals, plan, exercise technique, body mechanics, energy conservation techniques  Person educated: Patient Education method: Explanation, Demonstration, Tactile cues, Verbal cues, and Handouts Education comprehension: verbalized understanding, returned demonstration, verbal cues required, and  tactile cues required   HOME EXERCISE PROGRAM:  No Updates today  Access Code: [email or web address]: [email or web address] Date: 03/01/2022 Prepared by: MJanna Arch Exercises - Seated March  - 1 x daily - 7 x weekly - 2 sets - 10 reps - 5 hold - Seated Long Arc Quad  - 1 x daily - 7 x weekly - 2 sets - 10 reps - 5 hold - Seated Hip Abduction  - 1 x daily - 7 x weekly - 2 sets - 10 reps - 5 hold - Seated Hip Adduction Isometrics with Ball  - 1 x daily - 7 x weekly - 2 sets - 10 reps - 5 hold - Seated Gluteal Sets  - 1 x daily - 7 x weekly - 2 sets - 10 reps - 5 hold - Seated Heel Toe Raises  - 1 x daily - 7 x weekly - 2 sets - 10 reps - 5 hold LE strength and balance         PT Short Term Goals       PT SHORT TERM GOAL #1   Title Pt will be independent with initial HEP in order to improve strength and balance in order to decrease fall risk and improve function at home and work.    Baseline 02/23/2022- Patient reports not doing much as far as exercise in the home currently. 04/20/2022- Patient reports compliant with HEP and no questions at this time.; 9/7 pt indep   Time 6    Period Weeks    Status Goal met   Target Date 04/06/22              PT Long Term Goals       PT LONG TERM GOAL #1   Title Pt will be independent with final HEP in order to improve strength and balance in order to decrease fall risk and improve function at home and work. 04/20/2022- Patient reports compliant with HEP and no questions at this time.    Baseline 02/23/2022= No formal HEP in place. 9/7: pt performing at least once a day, feels comfortable   Time 12    Period Weeks    Status GOAL MET   Target Date 08/11/2022      PT LONG TERM GOAL #2   Title Pt will decrease 5TSTS by at least 8 seconds in order to demonstrate clinically significant improvement in LE strength.    Baseline 02/23/2022= 31.52 sec with B hands on knees; 04/20/2022= 16.08 sec with B hands on knees   Time 12    Period  Days    Status Goal Met   Target Date 05/18/22      PT LONG TERM GOAL #3   Title Pt will increase 10MWT by at least 0.23 m/s in order to demonstrate clinically significant improvement in community  ambulation.    Baseline 02/23/2022= 0.39 m/s using RW; 0.45 m/s; 9/7: 0.37 m/s with QC, 0.74 m/s with RW   Time 12    Period Weeks    Status GOAL MET   Target Date 05/18/22      PT LONG TERM GOAL #4   Title Pt will improve FOTO to target score of 50 to display perceived improvements in ability to complete ADL's.    Baseline 02/23/2022= 47; 9/7: 49; 11/30=54%   Time 12    Period Weeks    Status GOAL MET   Target Date 08/11/2022      PT LONG TERM GOAL #5   Title Pt will decrease TUG to below 20 seconds/decrease in order to demonstrate decreased fall risk.    Baseline 02/23/2022=28.27 sec with RW; 04/20/2022= 20.22 with SBQC; 9/7: 14.4 sec with RW, 18.5 sec with SBQC   Time 12    Period Weeks    Status MET   Target Date 05/18/22    PT LONG TERM GOAL #6  Title Pt will decrease TUG to 14 sec or below with SBQC in order to demonstrate decreased fall risk.   Baseline  9/7: 14.4 sec with RW, 18.5 sec with SBQC; 11/30=16.85 sec without an AD.   Time 12   Period Weeks   Status PROGRESSING  Target Date 11/05/2022   PT LONG TERM GOAL #7  Title Patient will increase six minute walk test distance to >700 ft for improved gait ability and increased ease with community participation.  Baseline 9/7: completes 4 minutes and 444 ft with RW. Test discontinued at 4 minutes due to [place]. 08/11/2022= patient ambulated 200 feet yet required 1 seated rest break- due to BLE fatigue- using SBQC  Time 12   Period Weeks   Status ONGOING  Target Date 11/03/2022   PT LONG TERM GOAL #8  Title Pt will decrease 5TSTS by at least 2 seconds (without UE support)  in order to demonstrate clinically significant improvement in LE strength.   Baseline 08/11/2022= 16.33 sec with B hands on knees  Time 12   Period Weeks   Status NEW  Target Date 11/03/2022            Plan -     Clinical Impression Statement Patient presents with excellent motivation for today's session. Treatment focused on progressive resistive LE strengthening with good results. Patient able to complete 2 sets of mostly standing therex without any significant difficulty or report of pain. Patient will continue to benefit from skilled physical therapy intervention to address impairments, improve QOL, and attain therapy goals.     Personal Factors and Comorbidities Age;Past/Current Experience;Time since onset of injury/illness/exacerbation;Comorbidity 3+    Comorbidities COPD, History of Cancer, myasthenia gravis, chronic lumbar surgical history    Examination-Activity Limitations Lift;Squat;Bend;Stairs;Stand;Transfers;Insurance claims handler;Bathing;Hygiene/Grooming;Dressing;Toileting;Bed Mobility;Caring for Others;Continence    Examination-Participation Restrictions Yard Work;Church;Cleaning;Driving;Community Activity    Stability/Clinical Decision Making Evolving/Moderate complexity    Rehab Potential Good    Clinical Impairments Affecting Rehab Potential multipe comorbidities    PT Frequency 2x / week    PT Duration 12 weeks    PT Treatment/Interventions ADLs/Self Care Home Management;Electrical Stimulation;Moist Heat;Traction;Ultrasound;Gait training;Stair training;Functional mobility training;Therapeutic activities;Therapeutic exercise;Balance training;Neuromuscular re-education;Patient/family education;Manual techniques;Passive range of motion;Dry needling;Joint Manipulations;Cryotherapy;Vestibular;Canalith Repostioning    PT Next Visit Plan standing strengthening and balance, seated strengthening and coordination    PT Home Exercise Plan Progress LE strengthen and functional endurance, continue plan   Consulted and Agree with Plan of Care Patient  Kathlee Nations Reann Dobias PT  08/19/22, 12:13 PM

## 2022-08-19 ENCOUNTER — Other Ambulatory Visit (INDEPENDENT_AMBULATORY_CARE_PROVIDER_SITE_OTHER): Payer: Self-pay | Admitting: Nurse Practitioner

## 2022-08-23 ENCOUNTER — Ambulatory Visit: Payer: Medicare Other

## 2022-08-23 ENCOUNTER — Ambulatory Visit: Payer: Medicare Other | Admitting: Physical Therapy

## 2022-08-25 ENCOUNTER — Ambulatory Visit: Payer: Medicare Other

## 2022-08-25 DIAGNOSIS — R262 Difficulty in walking, not elsewhere classified: Secondary | ICD-10-CM

## 2022-08-25 DIAGNOSIS — R278 Other lack of coordination: Secondary | ICD-10-CM

## 2022-08-25 DIAGNOSIS — R2689 Other abnormalities of gait and mobility: Secondary | ICD-10-CM

## 2022-08-25 DIAGNOSIS — M545 Low back pain, unspecified: Secondary | ICD-10-CM

## 2022-08-25 DIAGNOSIS — M6281 Muscle weakness (generalized): Secondary | ICD-10-CM

## 2022-08-25 DIAGNOSIS — R296 Repeated falls: Secondary | ICD-10-CM

## 2022-08-25 DIAGNOSIS — R2681 Unsteadiness on feet: Secondary | ICD-10-CM

## 2022-08-25 NOTE — Therapy (Signed)
OUTPATIENT PHYSICAL THERAPY TREATMENT NOTE      Patient Name: Francisco [name] MRN: [id number] DOB:1946-07-31, 76 y.o., male Today's Date: 08/26/2022  PCP: Adin Hector, MD REFERRING PROVIDER: Meade Maw MD   PT End of Session - 08/25/22 1521     Visit Number 36    Number of Visits 53    Date for PT Re-Evaluation 11/03/22    Authorization Time Period 08/11/22-11/03/2022    Progress Note Due on Visit 22    PT Start Time 1515    PT Stop Time 2924    PT Time Calculation (min) 42 min    Equipment Utilized During Treatment Gait belt    Activity Tolerance Patient tolerated treatment well    Behavior During Therapy WFL for tasks assessed/performed              Past Medical History:  Diagnosis Date   Arthritis    lower left hip   Atypical angina    Bilateral hand numbness    from back surgery   Bronchitis, chronic (HCC)    Cancer ([place])    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; Merkel cell cancer, and Basal cell cancer (twice; 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   Carotid stenosis    CHF (congestive heart failure) (HCC)    CKD (chronic kidney disease)    CKD (chronic kidney disease) stage 3, GFR 30-59 ml/min (HCC)    COPD (chronic obstructive pulmonary disease) ([place])    stage 2   DDD (degenerative disc disease), cervical    Dysrhythmia    post carotid stent bradycardia; PAF 09/2020   GERD (gastroesophageal reflux disease)    Hypercholesterolemia    Hypertension    Hypothyroidism    pt takes Levothyroxine daily   Lumbosacral spinal stenosis    Myasthenia gravis, adult form ([place])    PAD (peripheral artery disease) ([place])    Shortness of breath    Lung MD- Dr Darlin Coco   Sleep apnea    do not use CPAP every night   Past Surgical History:  Procedure Laterality Date   ANTERIOR CERVICAL DECOMP/DISCECTOMY FUSION  07/18/2011   Procedure: ANTERIOR CERVICAL DECOMPRESSION/DISCECTOMY FUSION 2 LEVELS;  Surgeon: Cooper Render Pool;  Location: [facility];  Service: Neurosurgery;   Laterality: N/A;  cervical five-six, cervical six-seven anterior cervical discectomy and fusion   BACK SURGERY     in [place]     01/2020 Right, 04/2020 Left   CARDIAC CATHETERIZATION     [facility], no stents   CAROTID PTA/STENT INTERVENTION N/A 09/17/2020   Procedure: CAROTID PTA/STENT INTERVENTION;  Surgeon: Algernon Huxley, MD;  Location: [facility];  Service: Cardiovascular;  Laterality: N/A;   CATARACT EXTRACTION W/PHACO Left 01/06/2020   Procedure: CATARACT EXTRACTION PHACO AND INTRAOCULAR LENS PLACEMENT (IOC) ISTENT INJ LEFT 3.81  00:33.3;  Surgeon: Eulogio Bear, MD;  Location: [facility];  Service: Ophthalmology;  Laterality: Left;   CATARACT EXTRACTION W/PHACO Right 02/03/2020   Procedure: CATARACT EXTRACTION PHACO AND INTRAOCULAR LENS PLACEMENT ([place]) RIGHT ISTENT INJ;  Surgeon: Eulogio Bear, MD;  Location: [facility];  Service: Ophthalmology;  Laterality: Right;  4.29 0:35.6   COLONOSCOPY     HERNIA REPAIR Left    inguinal hernia repair in 1985   LUMBAR LAMINECTOMY/DECOMPRESSION MICRODISCECTOMY Left 02/24/2014   Procedure: LUMBAR LAMINECTOMY/DECOMPRESSION MICRODISCECTOMY LUMBAR THREE-FOUR, FOUR-FIVE, LEFT FIVE-SACRAL ONE ;  Surgeon: Charlie Pitter, MD;  Location: [facility];  Service: Neurosurgery;  Laterality: Left;  LUMBAR LAMINECTOMY/DECOMPRESSION MICRODISCECTOMY LUMBAR THREE-FOUR, FOUR-FIVE, LEFT FIVE-SACRAL ONE    LUMBAR LAMINECTOMY/DECOMPRESSION MICRODISCECTOMY N/A 05/03/2021   Procedure: Laminectomy and Foraminotomy - L2-L3;  Surgeon: Earnie Larsson, MD;  Location: [facility];  Service: Neurosurgery;  Laterality: N/A;  3C   POSTERIOR CERVICAL FUSION/FORAMINOTOMY N/A 08/07/2020   Procedure: C3-6 POSTERIOR FUSION WITH DECOMPRESSION;  Surgeon: Meade Maw, MD;  Location: [facility];  Service: Neurosurgery;  Laterality: N/A;   PROSTATECTOMY  8/14   [facility] Dr Mare Ferrari    Patient Active Problem List    Diagnosis Date Noted   SVT (supraventricular tachycardia) 12/30/2021   Hypothyroidism 12/30/2021   Parkinson's disease 12/30/2021   Elevated troponin 12/30/2021   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   Acquired thrombophilia ([place]) 01/13/2021   History of decompression of median nerve 01/01/2021   Paroxysmal atrial fibrillation ([place]) 10/06/2020   Carotid stenosis, right 09/17/2020   S/P cervical spinal fusion    Leukocytosis    Essential hypertension    Anemia of chronic disease    Postoperative pain    Neuropathic pain    Cervical myelopathy ([place]) 08/07/2020   Preop cardiovascular exam 07/17/2020   SOB (shortness of breath) on exertion 07/17/2020   Leg weakness, bilateral 07/13/2020   Aortic atherosclerosis ([place]) 07/09/2020   Body mass index (BMI) 34.0-34.9, adult 12/25/2019   Myalgia 10/30/2019   Lumbar post-laminectomy syndrome 10/24/2019   PAD (peripheral artery disease) ([place]) 06/06/2019   CKD (chronic kidney disease) stage 3, GFR 30-59 ml/min (HCC) 04/29/2019   B12 deficiency 01/23/2019   Left arm numbness 02/28/2018   Neck pain 02/28/2018   Anemia 02/02/2017   DDD (degenerative disc disease), cervical 02/02/2017   Hypothyroid 02/02/2017   MRSA (methicillin resistant staph aureus) culture positive 02/02/2017   Nocturnal hypoxia 02/02/2017   Senile purpura ([place]) 10/03/2016   Essential hypertension, benign 09/16/2016   Bilateral carotid artery disease ([place]) 09/16/2016   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/18/2016   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   History of prostate cancer 12/21/2015   Left carpal tunnel syndrome 11/11/2015   Kidney stone on left side 07/05/2015   Myasthenia gravis ([place]) 05/26/2015   Radiculitis 01/05/2015   Long-term use of high-risk medication 10/15/2014   Persistent cough 09/10/2014   Pure hypercholesterolemia 07/25/2014   Spinal stenosis, lumbar region, with neurogenic claudication 02/24/2014   Lumbosacral stenosis  with neurogenic claudication 02/24/2014   COPD (chronic obstructive pulmonary disease) ([place]) 01/22/2014    REFERRING DIAG: balance disorder   THERAPY DIAG:  Difficulty in walking, not elsewhere classified  Muscle weakness (generalized)  Unsteadiness on feet  Other abnormalities of gait and mobility  Other lack of coordination  Chronic bilateral low back pain, unspecified whether sciatica present  Repeated falls  Rationale for Evaluation and Treatment Rehabilitation  PERTINENT HISTORY: Corby Vandenberghe is a 76 year old male referred to [facility] neuro for difficulty with balance. He was also just recently dx in May 2023 with Impingement syndrome of left shoulder (M75.42) Impingement syndrome of left shoulder (primary encounter diagnosis) Left shoulder pain, unspecified chronicity Nontraumatic rupture of left proximal biceps tendon Past medical hx includes Lumbar laminectomy 04/2021. C3-6 decompression, fusion on 11/26, myasthenia gravis, 2012 ACDF 5/6, 6/7; OSA, hypothyroidism, HTN, COPD, multiple back surgeries, 2021 carpal tunnel release.  PRECAUTIONS: Fall  SUBJECTIVE:  Patient reports feeling better overall and states was very weak and sore earlier this week and had to cancel his visit.    PAIN:  Are you  having pain? No   TODAY'S TREATMENT: 1214/2023   THEREX:   INTERVAL NUSTEP: LE only at seat 11 for mm strengthening and endurance L1- 25mn  L3- 1 min L5 1 min  L3 1 min L5 1 min L1 1 min Total distance= 0.2 mi   Deadlift - with kettlebell 3lb- x 6 then faituged and unable to continue. (Knees buckled)  NMR:   Static stand on varying surfaces (firm/foam) with varying feet position and eyes open/closed with head turning:  On firm- 30 sec with eyes open then 30 sec eyes closed then 10 reps- eyes open with head turns and 10 more reps with eyes closed and head turns (mild unsteadiness with all activities with eyes closed)  On foam-feet together same as above - More  unsteadiness in narrowed foot position and LOB with activities with eyes closed.   Side stepping with blue theraband around ankles in // bars - down and back x 4     PATIENT EDUCATION: Education details: Goals, plan, exercise technique, body mechanics, energy conservation techniques  Person educated: Patient Education method: Explanation, Demonstration, Tactile cues, Verbal cues, and Handouts Education comprehension: verbalized understanding, returned demonstration, verbal cues required, and tactile cues required   HOME EXERCISE PROGRAM:  No Updates today  Access Code: [email or web address]: [email or web address] Date: 03/01/2022 Prepared by: MJanna Arch Exercises - Seated March  - 1 x daily - 7 x weekly - 2 sets - 10 reps - 5 hold - Seated Long Arc Quad  - 1 x daily - 7 x weekly - 2 sets - 10 reps - 5 hold - Seated Hip Abduction  - 1 x daily - 7 x weekly - 2 sets - 10 reps - 5 hold - Seated Hip Adduction Isometrics with Ball  - 1 x daily - 7 x weekly - 2 sets - 10 reps - 5 hold - Seated Gluteal Sets  - 1 x daily - 7 x weekly - 2 sets - 10 reps - 5 hold - Seated Heel Toe Raises  - 1 x daily - 7 x weekly - 2 sets - 10 reps - 5 hold LE strength and balance         PT Short Term Goals       PT SHORT TERM GOAL #1   Title Pt will be independent with initial HEP in order to improve strength and balance in order to decrease fall risk and improve function at home and work.    Baseline 02/23/2022- Patient reports not doing much as far as exercise in the home currently. 04/20/2022- Patient reports compliant with HEP and no questions at this time.; 9/7 pt indep   Time 6    Period Weeks    Status Goal met   Target Date 04/06/22              PT Long Term Goals       PT LONG TERM GOAL #1   Title Pt will be independent with final HEP in order to improve strength and balance in order to decrease fall risk and improve function at home and work. 04/20/2022- Patient reports  compliant with HEP and no questions at this time.    Baseline 02/23/2022= No formal HEP in place. 9/7: pt performing at least once a day, feels comfortable   Time 12    Period Weeks    Status GOAL MET   Target Date 08/11/2022      PT LONG TERM GOAL #2  Title Pt will decrease 5TSTS by at least 8 seconds in order to demonstrate clinically significant improvement in LE strength.    Baseline 02/23/2022= 31.52 sec with B hands on knees; 04/20/2022= 16.08 sec with B hands on knees   Time 12    Period Days    Status Goal Met   Target Date 05/18/22      PT LONG TERM GOAL #3   Title Pt will increase 10MWT by at least 0.23 m/s in order to demonstrate clinically significant improvement in community ambulation.    Baseline 02/23/2022= 0.39 m/s using RW; 0.45 m/s; 9/7: 0.37 m/s with QC, 0.74 m/s with RW   Time 12    Period Weeks    Status GOAL MET   Target Date 05/18/22      PT LONG TERM GOAL #4   Title Pt will improve FOTO to target score of 50 to display perceived improvements in ability to complete ADL's.    Baseline 02/23/2022= 47; 9/7: 49; 11/30=54%   Time 12    Period Weeks    Status GOAL MET   Target Date 08/11/2022      PT LONG TERM GOAL #5   Title Pt will decrease TUG to below 20 seconds/decrease in order to demonstrate decreased fall risk.    Baseline 02/23/2022=28.27 sec with RW; 04/20/2022= 20.22 with SBQC; 9/7: 14.4 sec with RW, 18.5 sec with SBQC   Time 12    Period Weeks    Status MET   Target Date 05/18/22    PT LONG TERM GOAL #6  Title Pt will decrease TUG to 14 sec or below with SBQC in order to demonstrate decreased fall risk.   Baseline  9/7: 14.4 sec with RW, 18.5 sec with SBQC; 11/30=16.85 sec without an AD.   Time 12   Period Weeks   Status PROGRESSING  Target Date 11/05/2022   PT LONG TERM GOAL #7  Title Patient will increase six minute walk test distance to >700 ft for improved gait ability and increased ease with community participation.  Baseline 9/7: completes 4  minutes and 444 ft with RW. Test discontinued at 4 minutes due to [place]. 08/11/2022= patient ambulated 200 feet yet required 1 seated rest break- due to BLE fatigue- using SBQC  Time 12   Period Weeks   Status ONGOING  Target Date 11/03/2022   PT LONG TERM GOAL #8  Title Pt will decrease 5TSTS by at least 2 seconds (without UE support)  in order to demonstrate clinically significant improvement in LE strength.   Baseline 08/11/2022= 16.33 sec with B hands on knees  Time 12   Period Weeks  Status NEW  Target Date 11/03/2022            Plan -     Clinical Impression Statement Patient presented with good motivation. He exhibited difficulty with standing erect due to weakness and quads fatigued very quickly overall. He experienced the most difficulty with eyes closed balance activities - exhibiting a visually preferrenced bias. Patient will continue to benefit from skilled physical therapy intervention to address impairments, improve QOL, and attain therapy goals.     Personal Factors and Comorbidities Age;Past/Current Experience;Time since onset of injury/illness/exacerbation;Comorbidity 3+    Comorbidities COPD, History of Cancer, myasthenia gravis, chronic lumbar surgical history    Examination-Activity Limitations Lift;Squat;Bend;Stairs;Stand;Transfers;Insurance claims handler;Bathing;Hygiene/Grooming;Dressing;Toileting;Bed Mobility;Caring for Others;Continence    Examination-Participation Restrictions Yard Work;Church;Cleaning;Driving;Community Activity    Stability/Clinical Decision Making Evolving/Moderate complexity    Rehab Potential Good  Clinical Impairments Affecting Rehab Potential multipe comorbidities    PT Frequency 2x / week    PT Duration 12 weeks    PT Treatment/Interventions ADLs/Self Care Home Management;Electrical Stimulation;Moist Heat;Traction;Ultrasound;Gait training;Stair training;Functional mobility training;Therapeutic activities;Therapeutic  exercise;Balance training;Neuromuscular re-education;Patient/family education;Manual techniques;Passive range of motion;Dry needling;Joint Manipulations;Cryotherapy;Vestibular;Canalith Repostioning    PT Next Visit Plan standing strengthening and balance, seated strengthening and coordination    PT Home Exercise Plan Progress LE strengthen and functional endurance, continue plan   Consulted and Agree with Plan of Care Patient             Lewis Moccasin PT  08/26/22, 8:20 AM

## 2022-08-29 IMAGING — MR MR HEAD W/O CM
11 series · 41 of 48 positions shown · non-contrast
Comparison: Head CT 05/11/2012.

CLINICAL DATA: 74-year-old male with left side weakness and
numbness in the extremities. Worsening balance. Falls at home.
Symptoms x2 months.

EXAM:
MRI HEAD WITHOUT CONTRAST
TECHNIQUE: Multiplanar, multiecho pulse sequences of the brain and surrounding
structures were obtained without intravenous contrast.

[Series 5: ax dwi_tracew · axial · 3.0mm · 0.60mm/px · z∈[-78,+75]mm · 4 of 48 slices shown]
[im 1/48]
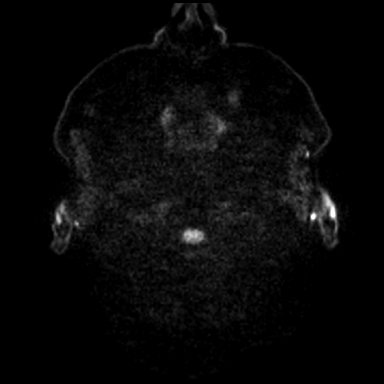
[im 16/48]
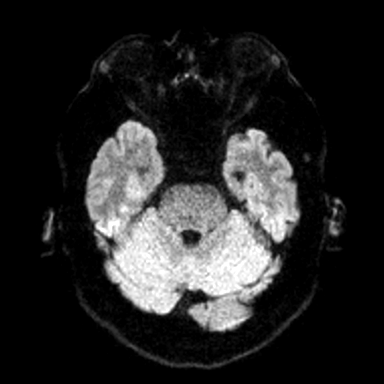
[im 32/48]
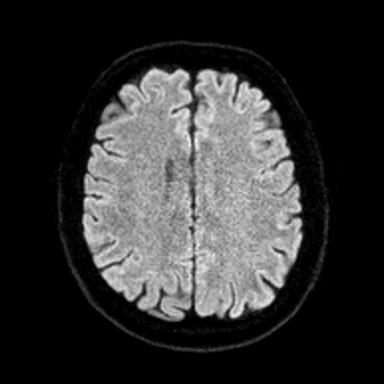
[im 48/48]
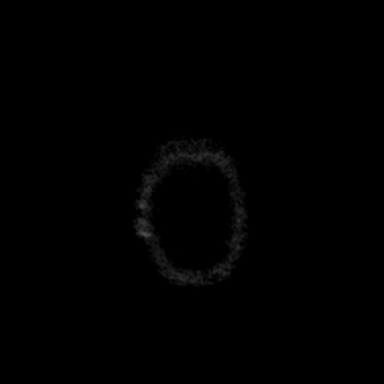

[Series 6: ax dwi_adc · axial · 3.0mm · 0.60mm/px · z∈[-78,+75]mm · 4 of 48 slices shown]
[im 1/48]
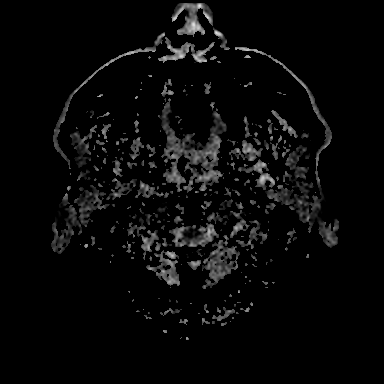
[im 16/48]
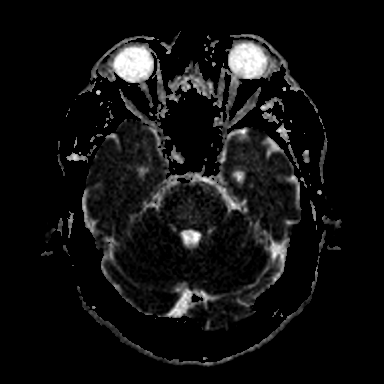
[im 32/48]
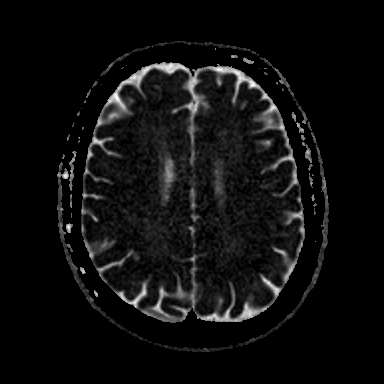
[im 48/48]
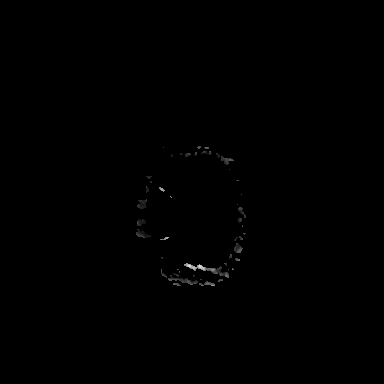

[Series 7: cor dwi_tracew · coronal · 5.0mm · 0.60mm/px · 3 of 40 slices shown]
[im 1/40]
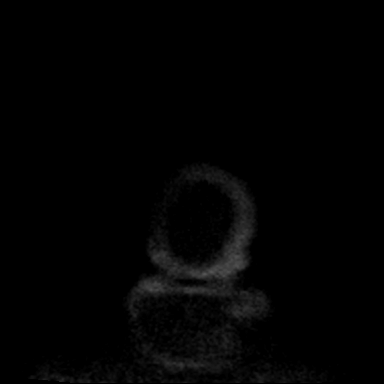
[im 20/40]
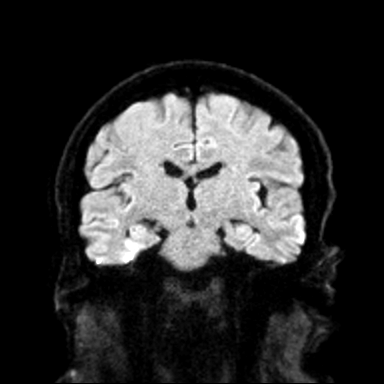
[im 40/40]
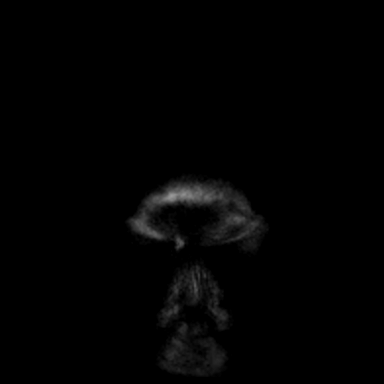

[Series 8: cor dwi_adc · coronal · 5.0mm · 0.60mm/px · 3 of 40 slices shown]
[im 1/40]
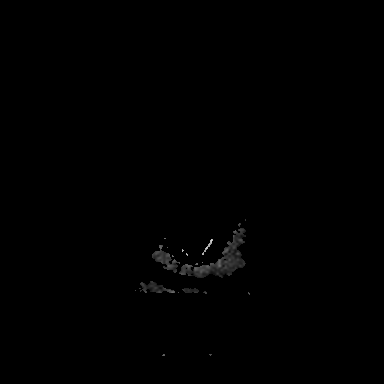
[im 20/40]
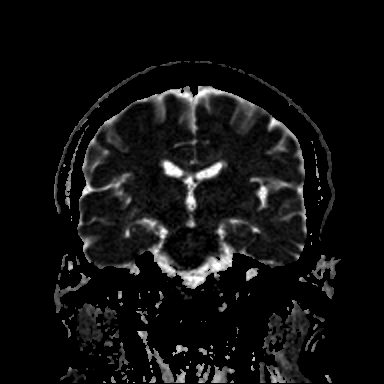
[im 40/40]
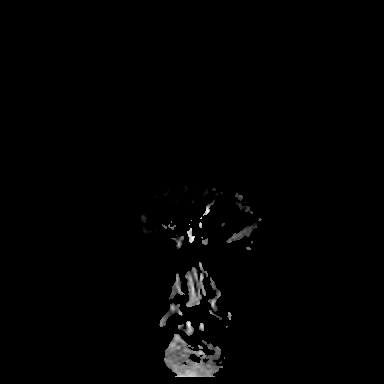

[Series 9: T1 · sagittal · 5.0mm · 0.62mm/px · 2 of 23 slices shown (1 of 2)]
[im 1/23]
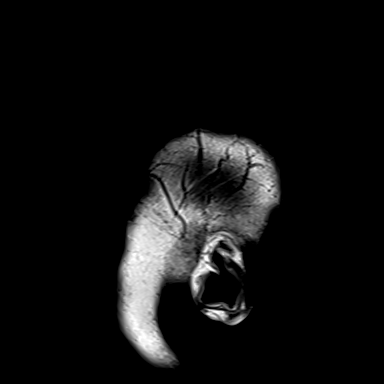
[im 23/23]
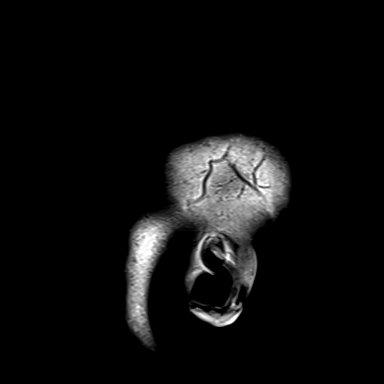

[Series 10: T2 · axial · 5.0mm · 0.53mm/px · z∈[-72,+70]mm · 2 of 25 slices shown (1 of 2)]
[im 1/25]
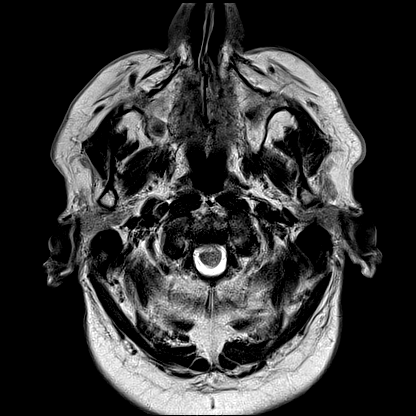
[im 25/25]
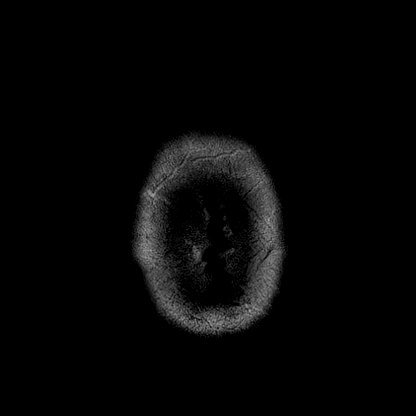

[Series 12: pha_images · axial · 3.0mm · 0.90mm/px · z∈[-81,+87]mm · 5 of 57 slices shown]
[im 1/57]
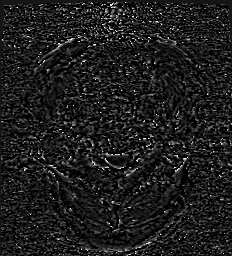
[im 15/57]
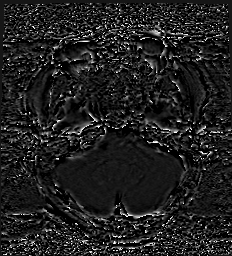
[im 29/57]
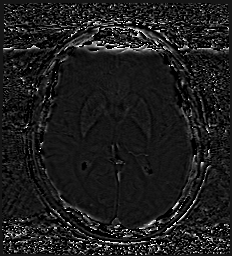
[im 43/57]
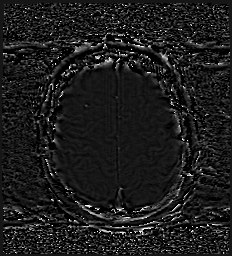
[im 57/57]
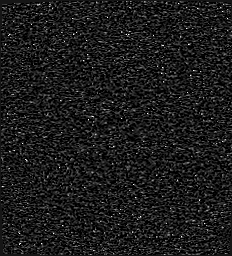

[Series 13: swi_images · axial · 3.0mm · 0.90mm/px · z∈[-87,+43]mm · 4 of 60 slices shown]
[im 1/60]
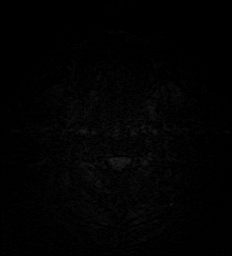
[im 15/60]
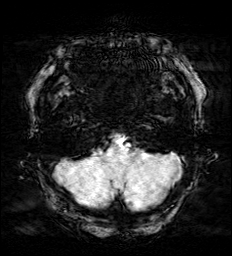
[im 30/60]
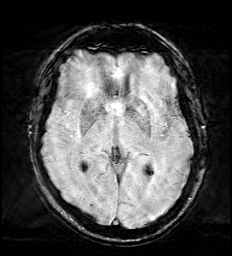
[im 45/60]
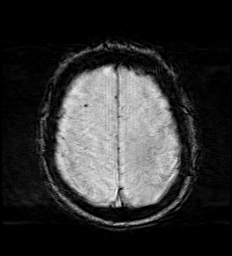

[Series 15: FLAIR · axial · 3.0mm · 0.53mm/px · z∈[-81,+79]mm · 4 of 55 slices shown]
[im 1/55]
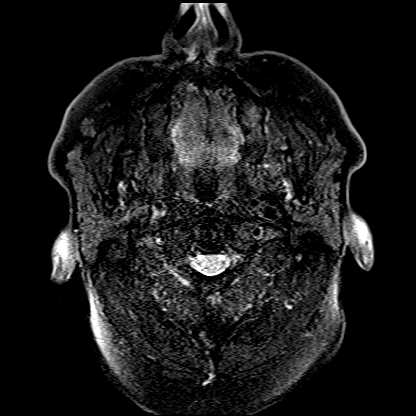
[im 19/55]
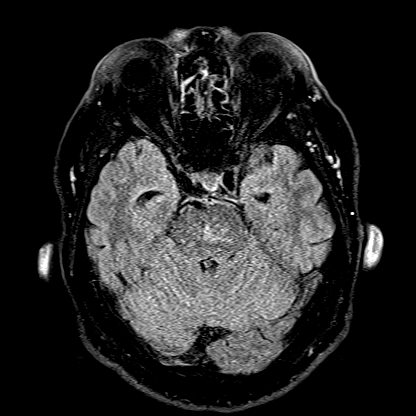
[im 37/55]
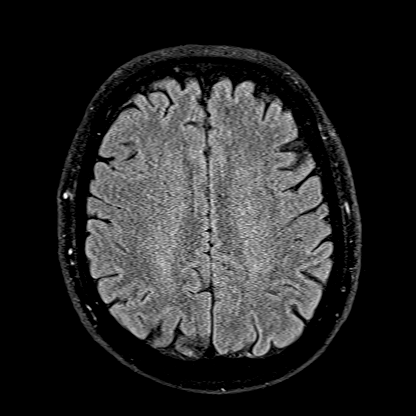
[im 55/55]
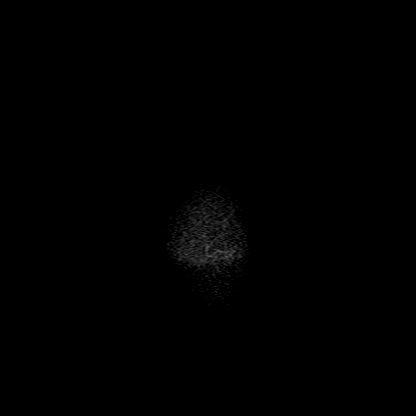

[Series 16: T1 · axial · 1.0mm · 0.98mm/px · z∈[-85,+88]mm · 8 of 176 slices shown (2 of 2)]
[im 1/176]
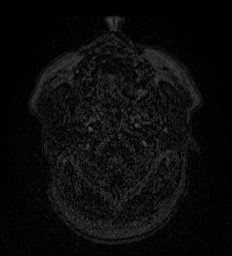
[im 27/176]
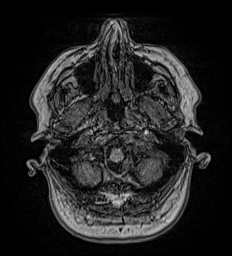
[im 54/176]
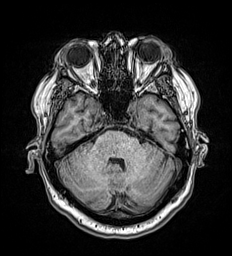
[im 81/176]
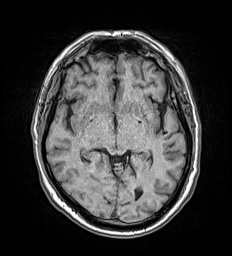
[im 95/176]
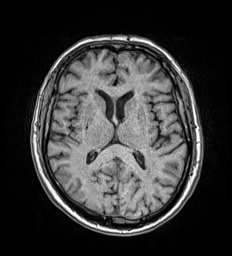
[im 122/176]
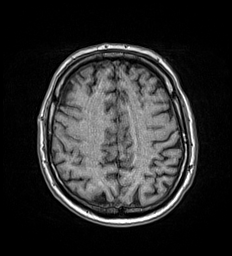
[im 149/176]
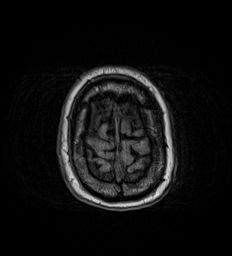
[im 176/176]
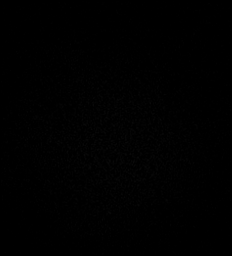

[Series 17: T2 · coronal · 5.0mm · 0.57mm/px · 2 of 29 slices shown (2 of 2)]
[im 1/29]
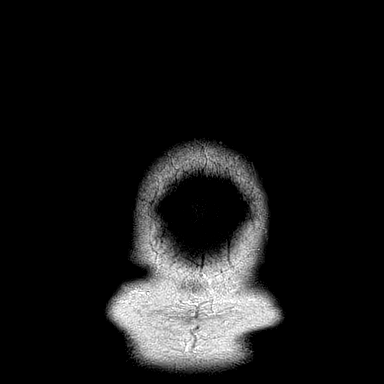
[im 29/29]
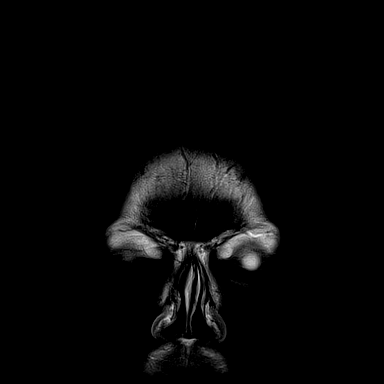

[41 of 48 positions shown; findings below may reference images not displayed]

FINDINGS: Brain: No restricted diffusion to suggest acute infarction. No
midline shift, mass effect, evidence of mass lesion,
ventriculomegaly, extra-axial collection or acute intracranial
hemorrhage. Cervicomedullary junction and pituitary are within
normal limits.

Cerebral volume is within normal limits for age. Largely normal for
age gray and white matter signal throughout the brain. There is
perhaps a small area of focal cortical encephalomalacia in the
anterior right frontal lobe on series 15, image 39. And there is a
nearby chronic microhemorrhage seen on series 13, image 45.

No other cortical encephalomalacia identified. Minimal for age
nonspecific cerebral white matter signal changes. There is mild to
moderate patchy T2 and FLAIR hyperintensity in the central pons
(series 15, image 19). Deep gray nuclei and cerebellum are within
normal limits.

Vascular: Major intracranial vascular flow voids are preserved.
Tortuous vertebrobasilar system.

Skull and upper cervical spine: Normal visible cervical spine, bone
marrow signal.

Sinuses/Orbits: Postoperative changes to both globes, otherwise
negative orbits. Minimal right maxillary sinus mucosal thickening.

Other: Mastoids are clear. Visible internal auditory structures
appear normal. Stylomastoid foramen appear normal. Scalp and face
appear negative.
IMPRESSION: 1.  No acute intracranial abnormality.

2. Small area of chronic cortical ischemia suspected in the anterior
right frontal lobe, with a nearby chronic microhemorrhage.
And up to moderate signal changes in the pons which are nonspecific
but most commonly due to small vessel disease.

These results will be called to the ordering clinician or
representative by the [HOSPITAL] at the imaging location.

## 2022-08-30 ENCOUNTER — Ambulatory Visit: Payer: Medicare Other

## 2022-08-30 DIAGNOSIS — R278 Other lack of coordination: Secondary | ICD-10-CM

## 2022-08-30 DIAGNOSIS — M6281 Muscle weakness (generalized): Secondary | ICD-10-CM

## 2022-08-30 DIAGNOSIS — G959 Disease of spinal cord, unspecified: Secondary | ICD-10-CM

## 2022-08-30 DIAGNOSIS — G8929 Other chronic pain: Secondary | ICD-10-CM

## 2022-08-30 DIAGNOSIS — R262 Difficulty in walking, not elsewhere classified: Secondary | ICD-10-CM | POA: Diagnosis not present

## 2022-08-30 DIAGNOSIS — R2681 Unsteadiness on feet: Secondary | ICD-10-CM

## 2022-08-30 DIAGNOSIS — R269 Unspecified abnormalities of gait and mobility: Secondary | ICD-10-CM

## 2022-08-30 DIAGNOSIS — R296 Repeated falls: Secondary | ICD-10-CM

## 2022-08-30 DIAGNOSIS — R2689 Other abnormalities of gait and mobility: Secondary | ICD-10-CM

## 2022-08-30 NOTE — Therapy (Signed)
OUTPATIENT PHYSICAL THERAPY TREATMENT NOTE      Patient Name: Francisco [name] MRN: [id number] DOB:1946/09/11, 76 y.o., male Today's Date: 08/30/2022  PCP: Adin Hector, MD REFERRING PROVIDER: Meade Maw MD   PT End of Session - 08/30/22 1531     Visit Number 37    Number of Visits 30    Date for PT Re-Evaluation 11/03/22    Authorization Time Period 08/11/22-11/03/2022    Progress Note Due on Visit 51    PT Start Time 1347    PT Stop Time 1430    PT Time Calculation (min) 43 min    Equipment Utilized During Treatment Gait belt    Activity Tolerance Patient tolerated treatment well    Behavior During Therapy WFL for tasks assessed/performed               Past Medical History:  Diagnosis Date   Arthritis    lower left hip   Atypical angina    Bilateral hand numbness    from back surgery   Bronchitis, chronic (HCC)    Cancer ([place])    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; Merkel cell cancer, and Basal cell cancer (twice; 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   Carotid stenosis    CHF (congestive heart failure) (HCC)    CKD (chronic kidney disease)    CKD (chronic kidney disease) stage 3, GFR 30-59 ml/min (HCC)    COPD (chronic obstructive pulmonary disease) ([place])    stage 2   DDD (degenerative disc disease), cervical    Dysrhythmia    post carotid stent bradycardia; PAF 09/2020   GERD (gastroesophageal reflux disease)    Hypercholesterolemia    Hypertension    Hypothyroidism    pt takes Levothyroxine daily   Lumbosacral spinal stenosis    Myasthenia gravis, adult form ([place])    PAD (peripheral artery disease) ([place])    Shortness of breath    Lung MD- Dr Darlin Coco   Sleep apnea    do not use CPAP every night   Past Surgical History:  Procedure Laterality Date   ANTERIOR CERVICAL DECOMP/DISCECTOMY FUSION  07/18/2011   Procedure: ANTERIOR CERVICAL DECOMPRESSION/DISCECTOMY FUSION 2 LEVELS;  Surgeon: Cooper Render Pool;  Location: [facility];  Service: Neurosurgery;   Laterality: N/A;  cervical five-six, cervical six-seven anterior cervical discectomy and fusion   BACK SURGERY     in [place]     01/2020 Right, 04/2020 Left   CARDIAC CATHETERIZATION     2005 at [facility], no stents   CAROTID PTA/STENT INTERVENTION N/A 09/17/2020   Procedure: CAROTID PTA/STENT INTERVENTION;  Surgeon: Algernon Huxley, MD;  Location: [facility];  Service: Cardiovascular;  Laterality: N/A;   CATARACT EXTRACTION W/PHACO Left 01/06/2020   Procedure: CATARACT EXTRACTION PHACO AND INTRAOCULAR LENS PLACEMENT (IOC) ISTENT INJ LEFT 3.81  00:33.3;  Surgeon: Eulogio Bear, MD;  Location: [facility];  Service: Ophthalmology;  Laterality: Left;   CATARACT EXTRACTION W/PHACO Right 02/03/2020   Procedure: CATARACT EXTRACTION PHACO AND INTRAOCULAR LENS PLACEMENT ([place]) RIGHT ISTENT INJ;  Surgeon: Eulogio Bear, MD;  Location: [facility];  Service: Ophthalmology;  Laterality: Right;  4.29 0:35.6   COLONOSCOPY     HERNIA REPAIR Left    inguinal hernia repair in 1985   LUMBAR LAMINECTOMY/DECOMPRESSION MICRODISCECTOMY Left 02/24/2014   Procedure: LUMBAR LAMINECTOMY/DECOMPRESSION MICRODISCECTOMY LUMBAR THREE-FOUR, FOUR-FIVE, LEFT FIVE-SACRAL ONE ;  Surgeon: Charlie Pitter, MD;  Location: [facility];  Service: Neurosurgery;  Laterality: Left;  LUMBAR LAMINECTOMY/DECOMPRESSION MICRODISCECTOMY LUMBAR THREE-FOUR, FOUR-FIVE, LEFT FIVE-SACRAL ONE    LUMBAR LAMINECTOMY/DECOMPRESSION MICRODISCECTOMY N/A 05/03/2021   Procedure: Laminectomy and Foraminotomy - L2-L3;  Surgeon: Earnie Larsson, MD;  Location: [facility];  Service: Neurosurgery;  Laterality: N/A;  3C   POSTERIOR CERVICAL FUSION/FORAMINOTOMY N/A 08/07/2020   Procedure: C3-6 POSTERIOR FUSION WITH DECOMPRESSION;  Surgeon: Meade Maw, MD;  Location: [facility];  Service: Neurosurgery;  Laterality: N/A;   PROSTATECTOMY  8/14   [facility] Dr Mare Ferrari    Patient Active Problem List    Diagnosis Date Noted   SVT (supraventricular tachycardia) 12/30/2021   Hypothyroidism 12/30/2021   Parkinson's disease 12/30/2021   Elevated troponin 12/30/2021   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   Acquired thrombophilia ([place]) 01/13/2021   History of decompression of median nerve 01/01/2021   Paroxysmal atrial fibrillation ([place]) 10/06/2020   Carotid stenosis, right 09/17/2020   S/P cervical spinal fusion    Leukocytosis    Essential hypertension    Anemia of chronic disease    Postoperative pain    Neuropathic pain    Cervical myelopathy ([place]) 08/07/2020   Preop cardiovascular exam 07/17/2020   SOB (shortness of breath) on exertion 07/17/2020   Leg weakness, bilateral 07/13/2020   Aortic atherosclerosis ([place]) 07/09/2020   Body mass index (BMI) 34.0-34.9, adult 12/25/2019   Myalgia 10/30/2019   Lumbar post-laminectomy syndrome 10/24/2019   PAD (peripheral artery disease) ([place]) 06/06/2019   CKD (chronic kidney disease) stage 3, GFR 30-59 ml/min (HCC) 04/29/2019   B12 deficiency 01/23/2019   Left arm numbness 02/28/2018   Neck pain 02/28/2018   Anemia 02/02/2017   DDD (degenerative disc disease), cervical 02/02/2017   Hypothyroid 02/02/2017   MRSA (methicillin resistant staph aureus) culture positive 02/02/2017   Nocturnal hypoxia 02/02/2017   Senile purpura ([place]) 10/03/2016   Essential hypertension, benign 09/16/2016   Bilateral carotid artery disease ([place]) 09/16/2016   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/18/2016   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   History of prostate cancer 12/21/2015   Left carpal tunnel syndrome 11/11/2015   Kidney stone on left side 07/05/2015   Myasthenia gravis ([place]) 05/26/2015   Radiculitis 01/05/2015   Long-term use of high-risk medication 10/15/2014   Persistent cough 09/10/2014   Pure hypercholesterolemia 07/25/2014   Spinal stenosis, lumbar region, with neurogenic claudication 02/24/2014   Lumbosacral stenosis  with neurogenic claudication 02/24/2014   COPD (chronic obstructive pulmonary disease) ([place]) 01/22/2014    REFERRING DIAG: balance disorder   THERAPY DIAG:  Difficulty in walking, not elsewhere classified  Muscle weakness (generalized)  Unsteadiness on feet  Other abnormalities of gait and mobility  Other lack of coordination  Chronic bilateral low back pain, unspecified whether sciatica present  Repeated falls  Cervical myelopathy (HCC)  Abnormality of gait and mobility  Chronic left-sided low back pain with left-sided sciatica  Rationale for Evaluation and Treatment Rehabilitation  PERTINENT HISTORY: Lewie Deman is a 76 year old male referred to [facility] neuro for difficulty with balance. He was also just recently dx in May 2023 with Impingement syndrome of left shoulder (M75.42) Impingement syndrome of left shoulder (primary encounter diagnosis) Left shoulder pain, unspecified chronicity Nontraumatic rupture of left proximal biceps tendon Past medical hx includes Lumbar laminectomy 04/2021. C3-6 decompression, fusion on 11/26, myasthenia gravis, 2012 ACDF 5/6, 6/7; OSA, hypothyroidism, HTN, COPD, multiple back surgeries, 2021 carpal tunnel release.  PRECAUTIONS: Fall  SUBJECTIVE:  Patient reports having some good days and bad days  and reports today is a pretty good day.    PAIN:  Are you having pain? No   TODAY'S TREATMENT: 08/30/2022   NMR:   Blaze pod activities:  Activity Description: step tap for SLS endurance/stabilization with 3 pods positioned in front of patient-The Blaze Pod All At Once setting was selected to create a dynamic environment for comprehensive, full-body workouts that challenge users with simultaneous activation of all Pods, fostering agility and enhancing reaction time Activity Setting:  all at once Number of Pods:  3 in front of patient Cycles/Sets:  4 Duration (Time or Hit Count):  3 hits  Patient Stats  Reaction Time:  1) 38.99 2)  35.59 3) 33.43 sec  (standing on left LE)   4) 31..68 sec 5) 30.56 6) 30.05 sec (standing on right LE)   Activity Description: 6 pods positioned in pathway for walking  Activity Setting:  sequencing- The Blaze Pod Sequence setting was selected to enhance cognitive function and motor skills, providing a structured environment to follow a specific pattern and improve memory, coordination, and movement sequences.   Number of Pods:  6 Cycles/Sets:  3 Duration (Time or Hit Count):  Hit count - 6  Patient Stats  Reaction Time:  1) 58.58 sec 2) 53.55 sec   .       Activity Description: [facility] setting was selected for the goal of improving spatial awareness and visual scanning ability, establishing a central point of reference during dynamic movements for enhanced balance and control. Activity Setting:  6 pods lined up in linear fashion approx 8in apart with homebase positioned in middle Number of Pods:  6 Cycles/Sets:  3 Duration (Time or Hit Count):  5  Patient Stats  Reaction Time:  1) 1 min 11 sec 2) 1 min 2 sec   Activity Description: steps including forward/retro/side stepping Activity Setting:  random- The Blaze Pod Random setting was chosen to enhance cognitive processing and agility, providing an unpredictable environment to simulate real-world scenarios, and fostering quick reactions and adaptability.   Number of Pods:  6 Cycles/Sets:  3 Duration (Time or Hit Count):  10 hit  Patient Stats  Reaction Time:  1)23.64 sec 2) 22.59 3) 24.79   PATIENT EDUCATION: Education details: Goals, plan, exercise technique, body mechanics, energy conservation techniques  Person educated: Patient Education method: Explanation, Demonstration, Tactile cues, Verbal cues, and Handouts Education comprehension: verbalized understanding, returned demonstration, verbal cues required, and tactile cues required   HOME EXERCISE PROGRAM:  No Updates today  Access Code: [email or web address] URL:  [email or web address] Date: 03/01/2022 Prepared by: Janna Arch  Exercises - Seated March  - 1 x daily - 7 x weekly - 2 sets - 10 reps - 5 hold - Seated Long Arc Quad  - 1 x daily - 7 x weekly - 2 sets - 10 reps - 5 hold - Seated Hip Abduction  - 1 x daily - 7 x weekly - 2 sets - 10 reps - 5 hold - Seated Hip Adduction Isometrics with Ball  - 1 x daily - 7 x weekly - 2 sets - 10 reps - 5 hold - Seated Gluteal Sets  - 1 x daily - 7 x weekly - 2 sets - 10 reps - 5 hold - Seated Heel Toe Raises  - 1 x daily - 7 x weekly - 2 sets - 10 reps - 5 hold LE strength and balance         PT Short Term Goals  PT SHORT TERM GOAL #1   Title Pt will be independent with initial HEP in order to improve strength and balance in order to decrease fall risk and improve function at home and work.    Baseline 02/23/2022- Patient reports not doing much as far as exercise in the home currently. 04/20/2022- Patient reports compliant with HEP and no questions at this time.; 9/7 pt indep   Time 6    Period Weeks    Status Goal met   Target Date 04/06/22              PT Long Term Goals       PT LONG TERM GOAL #1   Title Pt will be independent with final HEP in order to improve strength and balance in order to decrease fall risk and improve function at home and work. 04/20/2022- Patient reports compliant with HEP and no questions at this time.    Baseline 02/23/2022= No formal HEP in place. 9/7: pt performing at least once a day, feels comfortable   Time 12    Period Weeks    Status GOAL MET   Target Date 08/11/2022      PT LONG TERM GOAL #2   Title Pt will decrease 5TSTS by at least 8 seconds in order to demonstrate clinically significant improvement in LE strength.    Baseline 02/23/2022= 31.52 sec with B hands on knees; 04/20/2022= 16.08 sec with B hands on knees   Time 12    Period Days    Status Goal Met   Target Date 05/18/22      PT LONG TERM GOAL #3   Title Pt will increase  10MWT by at least 0.23 m/s in order to demonstrate clinically significant improvement in community ambulation.    Baseline 02/23/2022= 0.39 m/s using RW; 0.45 m/s; 9/7: 0.37 m/s with QC, 0.74 m/s with RW   Time 12    Period Weeks    Status GOAL MET   Target Date 05/18/22      PT LONG TERM GOAL #4   Title Pt will improve FOTO to target score of 50 to display perceived improvements in ability to complete ADL's.    Baseline 02/23/2022= 47; 9/7: 49; 11/30=54%   Time 12    Period Weeks    Status GOAL MET   Target Date 08/11/2022      PT LONG TERM GOAL #5   Title Pt will decrease TUG to below 20 seconds/decrease in order to demonstrate decreased fall risk.    Baseline 02/23/2022=28.27 sec with RW; 04/20/2022= 20.22 with SBQC; 9/7: 14.4 sec with RW, 18.5 sec with SBQC   Time 12    Period Weeks    Status MET   Target Date 05/18/22    PT LONG TERM GOAL #6  Title Pt will decrease TUG to 14 sec or below with SBQC in order to demonstrate decreased fall risk.   Baseline  9/7: 14.4 sec with RW, 18.5 sec with SBQC; 11/30=16.85 sec without an AD.   Time 12   Period Weeks   Status PROGRESSING  Target Date 11/05/2022   PT LONG TERM GOAL #7  Title Patient will increase six minute walk test distance to >700 ft for improved gait ability and increased ease with community participation.  Baseline 9/7: completes 4 minutes and 444 ft with RW. Test discontinued at 4 minutes due to [place]. 08/11/2022= patient ambulated 200 feet yet required 1 seated rest break- due to BLE fatigue- using [facility]  Time 12   Period Weeks   Status ONGOING  Target Date 11/03/2022   PT LONG TERM GOAL #8  Title Pt will decrease 5TSTS by at least 2 seconds (without UE support)  in order to demonstrate clinically significant improvement in LE strength.   Baseline 08/11/2022= 16.33 sec with B hands on knees  Time 12   Period Weeks  Status NEW  Target Date 11/03/2022            Plan -     Clinical Impression Statement Patient  presented with improving functional endurance as seen by ability to perform dynamic balance activities. The patient demonstrated ability to adapt with balance blaze pods today- exhibiting improved coordination, reaction time,  balance, and cognitive function. The incorporation of dual-tasking technology with color recognition and association with specific movements in Blaze Pods was strategically chosen to provide a dynamic training environment, enabling the patient to engage in simultaneous physical and cognitive tasks. This unique approach enhances not only their physical abilities but also fosters increased neural connectivity and mental awareness, contributing to a well-rounded and effective rehabilitation and training experience.  Patient will continue to benefit from skilled physical therapy intervention to address impairments, improve QOL, and attain therapy goals.     Personal Factors and Comorbidities Age;Past/Current Experience;Time since onset of injury/illness/exacerbation;Comorbidity 3+    Comorbidities COPD, History of Cancer, myasthenia gravis, chronic lumbar surgical history    Examination-Activity Limitations Lift;Squat;Bend;Stairs;Stand;Transfers;Insurance claims handler;Bathing;Hygiene/Grooming;Dressing;Toileting;Bed Mobility;Caring for Others;Continence    Examination-Participation Restrictions Yard Work;Church;Cleaning;Driving;Community Activity    Stability/Clinical Decision Making Evolving/Moderate complexity    Rehab Potential Good    Clinical Impairments Affecting Rehab Potential multipe comorbidities    PT Frequency 2x / week    PT Duration 12 weeks    PT Treatment/Interventions ADLs/Self Care Home Management;Electrical Stimulation;Moist Heat;Traction;Ultrasound;Gait training;Stair training;Functional mobility training;Therapeutic activities;Therapeutic exercise;Balance training;Neuromuscular re-education;Patient/family education;Manual techniques;Passive range of  motion;Dry needling;Joint Manipulations;Cryotherapy;Vestibular;Canalith Repostioning    PT Next Visit Plan standing strengthening and balance, seated strengthening and coordination    PT Home Exercise Plan Progress LE strengthen and functional endurance, continue plan   Consulted and Agree with Plan of Care Patient             Lewis Moccasin PT  08/30/22, 3:44 PM

## 2022-09-01 ENCOUNTER — Ambulatory Visit: Payer: Medicare Other

## 2022-09-01 DIAGNOSIS — R278 Other lack of coordination: Secondary | ICD-10-CM

## 2022-09-01 DIAGNOSIS — R2689 Other abnormalities of gait and mobility: Secondary | ICD-10-CM

## 2022-09-01 DIAGNOSIS — M6281 Muscle weakness (generalized): Secondary | ICD-10-CM

## 2022-09-01 DIAGNOSIS — R2681 Unsteadiness on feet: Secondary | ICD-10-CM

## 2022-09-01 DIAGNOSIS — R262 Difficulty in walking, not elsewhere classified: Secondary | ICD-10-CM | POA: Diagnosis not present

## 2022-09-01 NOTE — Therapy (Signed)
OUTPATIENT PHYSICAL THERAPY TREATMENT NOTE      Patient Name: Francisco [name] MRN: [id number] DOB:[birth year], 76 y.o., male Today's Date: 09/02/2022  PCP: Adin Hector, MD REFERRING PROVIDER: Meade Maw MD   PT End of Session - 09/01/22 1520     Visit Number 38    Number of Visits 72    Date for PT Re-Evaluation 11/03/22    Authorization Time Period 08/11/22-11/03/2022    Progress Note Due on Visit 47    PT Start Time 1520    PT Stop Time 1600    PT Time Calculation (min) 40 min    Equipment Utilized During Treatment Gait belt    Activity Tolerance Patient tolerated treatment well    Behavior During Therapy WFL for tasks assessed/performed               Past Medical History:  Diagnosis Date   Arthritis    lower left hip   Atypical angina    Bilateral hand numbness    from back surgery   Bronchitis, chronic (HCC)    Cancer ([place])    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; Merkel cell cancer, and Basal cell cancer (twice; 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   Carotid stenosis    CHF (congestive heart failure) (HCC)    CKD (chronic kidney disease)    CKD (chronic kidney disease) stage 3, GFR 30-59 ml/min (HCC)    COPD (chronic obstructive pulmonary disease) ([place])    stage 2   DDD (degenerative disc disease), cervical    Dysrhythmia    post carotid stent bradycardia; PAF 09/2020   GERD (gastroesophageal reflux disease)    Hypercholesterolemia    Hypertension    Hypothyroidism    pt takes Levothyroxine daily   Lumbosacral spinal stenosis    Myasthenia gravis, adult form ([place])    PAD (peripheral artery disease) ([place])    Shortness of breath    Lung MD- Dr Darlin Coco   Sleep apnea    do not use CPAP every night   Past Surgical History:  Procedure Laterality Date   ANTERIOR CERVICAL DECOMP/DISCECTOMY FUSION  07/18/2011   Procedure: ANTERIOR CERVICAL DECOMPRESSION/DISCECTOMY FUSION 2 LEVELS;  Surgeon: Cooper Francisco Pool;  Location: [place] [facility];  Service: Neurosurgery;   Laterality: N/A;  cervical five-six, cervical six-seven anterior cervical discectomy and fusion   BACK SURGERY     in [place]     01/2020 Right, 04/2020 Left   CARDIAC CATHETERIZATION     2005 at [facility], no stents   CAROTID PTA/STENT INTERVENTION N/A 09/17/2020   Procedure: CAROTID PTA/STENT INTERVENTION;  Surgeon: Algernon Huxley, MD;  Location: [place] [facility];  Service: Cardiovascular;  Laterality: N/A;   CATARACT EXTRACTION W/PHACO Left 01/06/2020   Procedure: CATARACT EXTRACTION PHACO AND INTRAOCULAR LENS PLACEMENT (IOC) ISTENT INJ LEFT 3.81  00:33.3;  Surgeon: Eulogio Bear, MD;  Location: [place];  Service: Ophthalmology;  Laterality: Left;   CATARACT EXTRACTION W/PHACO Right 02/03/2020   Procedure: CATARACT EXTRACTION PHACO AND INTRAOCULAR LENS PLACEMENT ([place]) RIGHT ISTENT INJ;  Surgeon: Eulogio Bear, MD;  Location: [place];  Service: Ophthalmology;  Laterality: Right;  4.29 0:35.6   COLONOSCOPY     HERNIA REPAIR Left    inguinal hernia repair in 1985   LUMBAR LAMINECTOMY/DECOMPRESSION MICRODISCECTOMY Left 02/24/2014   Procedure: LUMBAR LAMINECTOMY/DECOMPRESSION MICRODISCECTOMY LUMBAR THREE-FOUR, FOUR-FIVE, LEFT FIVE-SACRAL ONE ;  Surgeon: Charlie Pitter, MD;  Location: [facility];  Service: Neurosurgery;  Laterality: Left;  LUMBAR LAMINECTOMY/DECOMPRESSION MICRODISCECTOMY LUMBAR THREE-FOUR, FOUR-FIVE, LEFT FIVE-SACRAL ONE    LUMBAR LAMINECTOMY/DECOMPRESSION MICRODISCECTOMY N/A 05/03/2021   Procedure: Laminectomy and Foraminotomy - L2-L3;  Surgeon: Earnie Larsson, MD;  Location: [place];  Service: Neurosurgery;  Laterality: N/A;  3C   POSTERIOR CERVICAL FUSION/FORAMINOTOMY N/A 08/07/2020   Procedure: C3-6 POSTERIOR FUSION WITH DECOMPRESSION;  Surgeon: Meade Maw, MD;  Location: [facility];  Service: Neurosurgery;  Laterality: N/A;   PROSTATECTOMY  8/14   [facility] Dr Mare Ferrari    Patient Active Problem List    Diagnosis Date Noted   SVT (supraventricular tachycardia) 12/30/2021   Hypothyroidism 12/30/2021   Parkinson's disease 12/30/2021   Elevated troponin 12/30/2021   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   Acquired thrombophilia ([place]) 01/13/2021   History of decompression of median nerve 01/01/2021   Paroxysmal atrial fibrillation ([place]) 10/06/2020   Carotid stenosis, right 09/17/2020   S/P cervical spinal fusion    Leukocytosis    Essential hypertension    Anemia of chronic disease    Postoperative pain    Neuropathic pain    Cervical myelopathy ([place]) 08/07/2020   Preop cardiovascular exam 07/17/2020   SOB (shortness of breath) on exertion 07/17/2020   Leg weakness, bilateral 07/13/2020   Aortic atherosclerosis ([place]) 07/09/2020   Body mass index (BMI) 34.0-34.9, adult 12/25/2019   Myalgia 10/30/2019   Lumbar post-laminectomy syndrome 10/24/2019   PAD (peripheral artery disease) ([place]) 06/06/2019   CKD (chronic kidney disease) stage 3, GFR 30-59 ml/min (HCC) 04/29/2019   B12 deficiency 01/23/2019   Left arm numbness 02/28/2018   Neck pain 02/28/2018   Anemia 02/02/2017   DDD (degenerative disc disease), cervical 02/02/2017   Hypothyroid 02/02/2017   MRSA (methicillin resistant staph aureus) culture positive 02/02/2017   Nocturnal hypoxia 02/02/2017   Senile purpura ([place]) 10/03/2016   Essential hypertension, benign 09/16/2016   Bilateral carotid artery disease ([place]) 09/16/2016   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/18/2016   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   History of prostate cancer 12/21/2015   Left carpal tunnel syndrome 11/11/2015   Kidney stone on left side 07/05/2015   Myasthenia gravis ([place]) 05/26/2015   Radiculitis 01/05/2015   Long-term use of high-risk medication 10/15/2014   Persistent cough 09/10/2014   Pure hypercholesterolemia 07/25/2014   Spinal stenosis, lumbar region, with neurogenic claudication 02/24/2014   Lumbosacral stenosis  with neurogenic claudication 02/24/2014   COPD (chronic obstructive pulmonary disease) ([place]) 01/22/2014    REFERRING DIAG: balance disorder   THERAPY DIAG:  Muscle weakness (generalized)  Difficulty in walking, not elsewhere classified  Unsteadiness on feet  Other abnormalities of gait and mobility  Other lack of coordination  Rationale for Evaluation and Treatment Rehabilitation  PERTINENT HISTORY: Francisco [name] is a 76 year old male referred to [facility] neuro for difficulty with balance. He was also just recently dx in May 2023 with Impingement syndrome of left shoulder (M75.42) Impingement syndrome of left shoulder (primary encounter diagnosis) Left shoulder pain, unspecified chronicity Nontraumatic rupture of left proximal biceps tendon Past medical hx includes Lumbar laminectomy 04/2021. C3-6 decompression, fusion on 11/26, myasthenia gravis, 2012 ACDF 5/6, 6/7; OSA, hypothyroidism, HTN, COPD, multiple back surgeries, 2021 carpal tunnel release.  PRECAUTIONS: Fall  SUBJECTIVE:  Pt reports no new concerns. No falls.    PAIN:  Are you having pain? No   TODAY'S TREATMENT: 09/01/2022   There.ex:   STS with RLE on airex pad: 3x8, CGA   LLE 6" step up:  2x8 with BUE support  Blue TB scap retractions: 2x20. Educated on home use and safe anchoring techniques. Encouraged to perform sitting due to baseline balance issues. Performed to initiate upright posterior chain postural strengthening to prevent anterior trunk lean.    NMR:  Airex pad exercises:   Lateral airex pad step ups: bilaterally, 2x6. Needs at least SUE support, CGA provided  Feet together eyes closed: 3x30 seconds, CGA  Tandem stance: 2x15sec/LE under BOS, CGA        PATIENT EDUCATION: Education details: Goals, plan, exercise technique, body mechanics, energy conservation techniques  Person educated: Patient Education method: Explanation, Demonstration, Tactile cues, Verbal cues, and Handouts Education  comprehension: verbalized understanding, returned demonstration, verbal cues required, and tactile cues required   HOME EXERCISE PROGRAM:  No Updates today  Access Code: [email or web address] URL: [email or web address] Date: 03/01/2022 Prepared by: Janna Arch  Exercises - Seated March  - 1 x daily - 7 x weekly - 2 sets - 10 reps - 5 hold - Seated Long Arc Quad  - 1 x daily - 7 x weekly - 2 sets - 10 reps - 5 hold - Seated Hip Abduction  - 1 x daily - 7 x weekly - 2 sets - 10 reps - 5 hold - Seated Hip Adduction Isometrics with Ball  - 1 x daily - 7 x weekly - 2 sets - 10 reps - 5 hold - Seated Gluteal Sets  - 1 x daily - 7 x weekly - 2 sets - 10 reps - 5 hold - Seated Heel Toe Raises  - 1 x daily - 7 x weekly - 2 sets - 10 reps - 5 hold LE strength and balance         PT Short Term Goals       PT SHORT TERM GOAL #1   Title Pt will be independent with initial HEP in order to improve strength and balance in order to decrease fall risk and improve function at home and work.    Baseline 02/23/2022- Patient reports not doing much as far as exercise in the home currently. 04/20/2022- Patient reports compliant with HEP and no questions at this time.; 9/7 pt indep   Time 6    Period Weeks    Status Goal met   Target Date 04/06/22              PT Long Term Goals       PT LONG TERM GOAL #1   Title Pt will be independent with final HEP in order to improve strength and balance in order to decrease fall risk and improve function at home and work. 04/20/2022- Patient reports compliant with HEP and no questions at this time.    Baseline 02/23/2022= No formal HEP in place. 9/7: pt performing at least once a day, feels comfortable   Time 12    Period Weeks    Status GOAL MET   Target Date 08/11/2022      PT LONG TERM GOAL #2   Title Pt will decrease 5TSTS by at least 8 seconds in order to demonstrate clinically significant improvement in LE strength.    Baseline 02/23/2022= 31.52  sec with B hands on knees; 04/20/2022= 16.08 sec with B hands on knees   Time 12    Period Days    Status Goal Met   Target Date 05/18/22      PT LONG TERM GOAL #3   Title Pt will increase 10MWT by at least 0.23  m/s in order to demonstrate clinically significant improvement in community ambulation.    Baseline 02/23/2022= 0.39 m/s using RW; 0.45 m/s; 9/7: 0.37 m/s with QC, 0.74 m/s with RW   Time 12    Period Weeks    Status GOAL MET   Target Date 05/18/22      PT LONG TERM GOAL #4   Title Pt will improve FOTO to target score of 50 to display perceived improvements in ability to complete ADL's.    Baseline 02/23/2022= 47; 9/7: 49; 11/30=54%   Time 12    Period Weeks    Status GOAL MET   Target Date 08/11/2022      PT LONG TERM GOAL #5   Title Pt will decrease TUG to below 20 seconds/decrease in order to demonstrate decreased fall risk.    Baseline 02/23/2022=28.27 sec with RW; 04/20/2022= 20.22 with SBQC; 9/7: 14.4 sec with RW, 18.5 sec with SBQC   Time 12    Period Weeks    Status MET   Target Date 05/18/22    PT LONG TERM GOAL #6  Title Pt will decrease TUG to 14 sec or below with SBQC in order to demonstrate decreased fall risk.   Baseline  9/7: 14.4 sec with RW, 18.5 sec with SBQC; 11/30=16.85 sec without an AD.   Time 12   Period Weeks   Status PROGRESSING  Target Date 11/05/2022   PT LONG TERM GOAL #7  Title Patient will increase six minute walk test distance to >700 ft for improved gait ability and increased ease with community participation.  Baseline 9/7: completes 4 minutes and 444 ft with RW. Test discontinued at 4 minutes due to [place]. 08/11/2022= patient ambulated 200 feet yet required 1 seated rest break- due to BLE fatigue- using SBQC  Time 12   Period Weeks   Status ONGOING  Target Date 11/03/2022   PT LONG TERM GOAL #8  Title Pt will decrease 5TSTS by at least 2 seconds (without UE support)  in order to demonstrate clinically significant improvement in LE  strength.   Baseline 08/11/2022= 16.33 sec with B hands on knees  Time 12   Period Weeks  Status NEW  Target Date 11/03/2022            Plan -     Clinical Impression Statement Continuing PT POC with focus on dynamic balance and strengthening. Engaging in postural strengthening as pt begins to anteriorly weight shift with prolonged standing balance/strengthening exercise indicative of posterior weakness. Frequent need for seated rest breaks appreciated especially with unstable surfaces due to reports of LE fatigue. Pt will continue to benefit from skilled physical therapy intervention to address impairments, improve QOL, and attain therapy goals.    Personal Factors and Comorbidities Age;Past/Current Experience;Time since onset of injury/illness/exacerbation;Comorbidity 3+    Comorbidities COPD, History of Cancer, myasthenia gravis, chronic lumbar surgical history    Examination-Activity Limitations Lift;Squat;Bend;Stairs;Stand;Transfers;Insurance claims handler;Bathing;Hygiene/Grooming;Dressing;Toileting;Bed Mobility;Caring for Others;Continence    Examination-Participation Restrictions Yard Work;Church;Cleaning;Driving;Community Activity    Stability/Clinical Decision Making Evolving/Moderate complexity    Rehab Potential Good    Clinical Impairments Affecting Rehab Potential multipe comorbidities    PT Frequency 2x / week    PT Duration 12 weeks    PT Treatment/Interventions ADLs/Self Care Home Management;Electrical Stimulation;Moist Heat;Traction;Ultrasound;Gait training;Stair training;Functional mobility training;Therapeutic activities;Therapeutic exercise;Balance training;Neuromuscular re-education;Patient/family education;Manual techniques;Passive range of motion;Dry needling;Joint Manipulations;Cryotherapy;Vestibular;Canalith Repostioning    PT Next Visit Plan standing strengthening and balance, seated strengthening and coordination    PT Home Exercise  Plan Progress LE  strengthen and functional endurance, continue plan   Consulted and Agree with Plan of Care Patient             Salem Caster. Fairly IV, PT, DPT Physical Therapist- [facility]  09/02/22, 8:14 AM

## 2022-09-06 ENCOUNTER — Ambulatory Visit: Payer: Medicare Other

## 2022-09-08 ENCOUNTER — Ambulatory Visit: Payer: Medicare Other

## 2022-09-08 DIAGNOSIS — R278 Other lack of coordination: Secondary | ICD-10-CM

## 2022-09-08 DIAGNOSIS — G959 Disease of spinal cord, unspecified: Secondary | ICD-10-CM

## 2022-09-08 DIAGNOSIS — R2689 Other abnormalities of gait and mobility: Secondary | ICD-10-CM

## 2022-09-08 DIAGNOSIS — M6281 Muscle weakness (generalized): Secondary | ICD-10-CM

## 2022-09-08 DIAGNOSIS — R2681 Unsteadiness on feet: Secondary | ICD-10-CM

## 2022-09-08 DIAGNOSIS — R296 Repeated falls: Secondary | ICD-10-CM

## 2022-09-08 DIAGNOSIS — R262 Difficulty in walking, not elsewhere classified: Secondary | ICD-10-CM | POA: Diagnosis not present

## 2022-09-08 DIAGNOSIS — G8929 Other chronic pain: Secondary | ICD-10-CM

## 2022-09-08 NOTE — Therapy (Signed)
OUTPATIENT PHYSICAL THERAPY TREATMENT NOTE      Patient Name: Francisco [name] MRN: [id number] DOB:10-29-1945, 76 y.o., male Today's Date: 09/09/2022  PCP: Adin Hector, MD REFERRING PROVIDER: Meade Maw MD   PT End of Session - 09/08/22 1524     Visit Number 39    Number of Visits 70    Date for PT Re-Evaluation 11/03/22    Authorization Time Period 08/11/22-11/03/2022    Progress Note Due on Visit 6    PT Start Time 1515    PT Stop Time 3646    PT Time Calculation (min) 42 min    Equipment Utilized During Treatment Gait belt    Activity Tolerance Patient tolerated treatment well    Behavior During Therapy WFL for tasks assessed/performed                Past Medical History:  Diagnosis Date   Arthritis    lower left hip   Atypical angina    Bilateral hand numbness    from back surgery   Bronchitis, chronic (HCC)    Cancer ([place])    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; Merkel cell cancer, and Basal cell cancer (twice; 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   Carotid stenosis    CHF (congestive heart failure) (HCC)    CKD (chronic kidney disease)    CKD (chronic kidney disease) stage 3, GFR 30-59 ml/min (HCC)    COPD (chronic obstructive pulmonary disease) ([place])    stage 2   DDD (degenerative disc disease), cervical    Dysrhythmia    post carotid stent bradycardia; PAF 09/2020   GERD (gastroesophageal reflux disease)    Hypercholesterolemia    Hypertension    Hypothyroidism    pt takes Levothyroxine daily   Lumbosacral spinal stenosis    Myasthenia gravis, adult form ([place])    PAD (peripheral artery disease) ([place])    Shortness of breath    Lung MD- Dr Darlin Coco   Sleep apnea    do not use CPAP every night   Past Surgical History:  Procedure Laterality Date   ANTERIOR CERVICAL DECOMP/DISCECTOMY FUSION  07/18/2011   Procedure: ANTERIOR CERVICAL DECOMPRESSION/DISCECTOMY FUSION 2 LEVELS;  Surgeon: Cooper Render Pool;  Location: [place] [facility];  Service: Neurosurgery;   Laterality: N/A;  cervical five-six, cervical six-seven anterior cervical discectomy and fusion   BACK SURGERY     in [place]     01/2020 Right, 04/2020 Left   CARDIAC CATHETERIZATION     2005 at [facility], no stents   CAROTID PTA/STENT INTERVENTION N/A 09/17/2020   Procedure: CAROTID PTA/STENT INTERVENTION;  Surgeon: Algernon Huxley, MD;  Location: [place] CV LAB;  Service: Cardiovascular;  Laterality: N/A;   CATARACT EXTRACTION W/PHACO Left 01/06/2020   Procedure: CATARACT EXTRACTION PHACO AND INTRAOCULAR LENS PLACEMENT (IOC) ISTENT INJ LEFT 3.81  00:33.3;  Surgeon: Eulogio Bear, MD;  Location: [place];  Service: Ophthalmology;  Laterality: Left;   CATARACT EXTRACTION W/PHACO Right 02/03/2020   Procedure: CATARACT EXTRACTION PHACO AND INTRAOCULAR LENS PLACEMENT ([place]) RIGHT ISTENT INJ;  Surgeon: Eulogio Bear, MD;  Location: [place];  Service: Ophthalmology;  Laterality: Right;  4.29 0:35.6   COLONOSCOPY     HERNIA REPAIR Left    inguinal hernia repair in [place] MICRODISCECTOMY Left 02/24/2014   Procedure: LUMBAR LAMINECTOMY/DECOMPRESSION MICRODISCECTOMY LUMBAR THREE-FOUR, FOUR-FIVE, LEFT FIVE-SACRAL ONE ;  Surgeon: Charlie Pitter, MD;  Location: [place]  ORS;  Service: Neurosurgery;  Laterality: Left;  LUMBAR LAMINECTOMY/DECOMPRESSION MICRODISCECTOMY LUMBAR THREE-FOUR, FOUR-FIVE, LEFT FIVE-SACRAL ONE    LUMBAR LAMINECTOMY/DECOMPRESSION MICRODISCECTOMY N/A 05/03/2021   Procedure: Laminectomy and Foraminotomy - L2-L3;  Surgeon: Earnie Larsson, MD;  Location: [place];  Service: Neurosurgery;  Laterality: N/A;  3C   POSTERIOR CERVICAL FUSION/FORAMINOTOMY N/A 08/07/2020   Procedure: C3-6 POSTERIOR FUSION WITH DECOMPRESSION;  Surgeon: Meade Maw, MD;  Location: [facility] ORS;  Service: Neurosurgery;  Laterality: N/A;   PROSTATECTOMY  8/14   [facility] Dr Mare Ferrari    Patient Active Problem List    Diagnosis Date Noted   SVT (supraventricular tachycardia) 12/30/2021   Hypothyroidism 12/30/2021   Parkinson's disease 12/30/2021   Elevated troponin 12/30/2021   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   Acquired thrombophilia ([place]) 01/13/2021   History of decompression of median nerve 01/01/2021   Paroxysmal atrial fibrillation ([place]) 10/06/2020   Carotid stenosis, right 09/17/2020   S/P cervical spinal fusion    Leukocytosis    Essential hypertension    Anemia of chronic disease    Postoperative pain    Neuropathic pain    Cervical myelopathy ([place]) 08/07/2020   Preop cardiovascular exam 07/17/2020   SOB (shortness of breath) on exertion 07/17/2020   Leg weakness, bilateral 07/13/2020   Aortic atherosclerosis ([place]) 07/09/2020   Body mass index (BMI) 34.0-34.9, adult 12/25/2019   Myalgia 10/30/2019   Lumbar post-laminectomy syndrome 10/24/2019   PAD (peripheral artery disease) ([place]) 06/06/2019   CKD (chronic kidney disease) stage 3, GFR 30-59 ml/min (HCC) 04/29/2019   B12 deficiency 01/23/2019   Left arm numbness 02/28/2018   Neck pain 02/28/2018   Anemia 02/02/2017   DDD (degenerative disc disease), cervical 02/02/2017   Hypothyroid 02/02/2017   MRSA (methicillin resistant staph aureus) culture positive 02/02/2017   Nocturnal hypoxia 02/02/2017   Senile purpura ([place]) 10/03/2016   Essential hypertension, benign 09/16/2016   Bilateral carotid artery disease ([place]) 09/16/2016   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/18/2016   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   History of prostate cancer 12/21/2015   Left carpal tunnel syndrome 11/11/2015   Kidney stone on left side 07/05/2015   Myasthenia gravis ([place]) 05/26/2015   Radiculitis 01/05/2015   Long-term use of high-risk medication 10/15/2014   Persistent cough 09/10/2014   Pure hypercholesterolemia 07/25/2014   Spinal stenosis, lumbar region, with neurogenic claudication 02/24/2014   Lumbosacral stenosis  with neurogenic claudication 02/24/2014   COPD (chronic obstructive pulmonary disease) ([place]) 01/22/2014    REFERRING DIAG: balance disorder   THERAPY DIAG:  Muscle weakness (generalized)  Difficulty in walking, not elsewhere classified  Unsteadiness on feet  Other abnormalities of gait and mobility  Other lack of coordination  Chronic bilateral low back pain, unspecified whether sciatica present  Repeated falls  Cervical myelopathy (HCC)  Rationale for Evaluation and Treatment Rehabilitation  PERTINENT HISTORY: Righteous Claiborne is a 76 year old male referred to [facility] neuro for difficulty with balance. He was also just recently dx in May 2023 with Impingement syndrome of left shoulder (M75.42) Impingement syndrome of left shoulder (primary encounter diagnosis) Left shoulder pain, unspecified chronicity Nontraumatic rupture of left proximal biceps tendon Past medical hx includes Lumbar laminectomy 04/2021. C3-6 decompression, fusion on 11/26, myasthenia gravis, 2012 ACDF 5/6, 6/7; OSA, hypothyroidism, HTN, COPD, multiple back surgeries, 2021 carpal tunnel release.  PRECAUTIONS: Fall  SUBJECTIVE:  Pt reports having a rough Christmas stating he felt very weak with difficulty moving for a couple of days. Reports feeling better  today.   PAIN:  Are you having pain? No   TODAY'S TREATMENT: 09/08/2022   There.ex:   Seated calf raises x 20 reps (TOES ON FOAM ROLL) Seated toe raises x 20 reps (Heels on foam roll)   Postural Strengthening: -horizontal shoulder abd with BTB 1 set x 10 reps; 1 set of 20 reps  -Scap retraction with BTB 2 reps of 10 reps  - Forward step up/over varying height objects in // bars:    -1/2 in PVC pipe x 12 reps    -1/2 foam roll x 12 reps    -Orange hurdle x 9 reps (patient stopped secondary to fatigue)   Ambulation in clinic: Patient ambulation attempted without cane approx 50 feet with short shuffling steps. Difficulty with advancing LE - fatigue as  limiting factor.           PATIENT EDUCATION: Education details: Goals, plan, exercise technique, body mechanics, energy conservation techniques  Person educated: Patient Education method: Explanation, Demonstration, Tactile cues, Verbal cues, and Handouts Education comprehension: verbalized understanding, returned demonstration, verbal cues required, and tactile cues required   HOME EXERCISE PROGRAM:  No Updates today  Access Code: [email or web address] URL: [email or web address] Date: 03/01/2022 Prepared by: Janna Arch  Exercises - Seated March  - 1 x daily - 7 x weekly - 2 sets - 10 reps - 5 hold - Seated Long Arc Quad  - 1 x daily - 7 x weekly - 2 sets - 10 reps - 5 hold - Seated Hip Abduction  - 1 x daily - 7 x weekly - 2 sets - 10 reps - 5 hold - Seated Hip Adduction Isometrics with Ball  - 1 x daily - 7 x weekly - 2 sets - 10 reps - 5 hold - Seated Gluteal Sets  - 1 x daily - 7 x weekly - 2 sets - 10 reps - 5 hold - Seated Heel Toe Raises  - 1 x daily - 7 x weekly - 2 sets - 10 reps - 5 hold LE strength and balance         PT Short Term Goals       PT SHORT TERM GOAL #1   Title Pt will be independent with initial HEP in order to improve strength and balance in order to decrease fall risk and improve function at home and work.    Baseline 02/23/2022- Patient reports not doing much as far as exercise in the home currently. 04/20/2022- Patient reports compliant with HEP and no questions at this time.; 9/7 pt indep   Time 6    Period Weeks    Status Goal met   Target Date 04/06/22              PT Long Term Goals       PT LONG TERM GOAL #1   Title Pt will be independent with final HEP in order to improve strength and balance in order to decrease fall risk and improve function at home and work. 04/20/2022- Patient reports compliant with HEP and no questions at this time.    Baseline 02/23/2022= No formal HEP in place. 9/7: pt performing at least once a day,  feels comfortable   Time 12    Period Weeks    Status GOAL MET   Target Date 08/11/2022      PT LONG TERM GOAL #2   Title Pt will decrease 5TSTS by at least 8 seconds in order to demonstrate clinically significant improvement in LE  strength.    Baseline 02/23/2022= 31.52 sec with B hands on knees; 04/20/2022= 16.08 sec with B hands on knees   Time 12    Period Days    Status Goal Met   Target Date 05/18/22      PT LONG TERM GOAL #3   Title Pt will increase 10MWT by at least 0.23 m/s in order to demonstrate clinically significant improvement in community ambulation.    Baseline 02/23/2022= 0.39 m/s using RW; 0.45 m/s; 9/7: 0.37 m/s with QC, 0.74 m/s with RW   Time 12    Period Weeks    Status GOAL MET   Target Date 05/18/22      PT LONG TERM GOAL #4   Title Pt will improve FOTO to target score of 50 to display perceived improvements in ability to complete ADL's.    Baseline 02/23/2022= 47; 9/7: 49; 11/30=54%   Time 12    Period Weeks    Status GOAL MET   Target Date 08/11/2022      PT LONG TERM GOAL #5   Title Pt will decrease TUG to below 20 seconds/decrease in order to demonstrate decreased fall risk.    Baseline 02/23/2022=28.27 sec with RW; 04/20/2022= 20.22 with SBQC; 9/7: 14.4 sec with RW, 18.5 sec with SBQC   Time 12    Period Weeks    Status MET   Target Date 05/18/22    PT LONG TERM GOAL #6  Title Pt will decrease TUG to 14 sec or below with SBQC in order to demonstrate decreased fall risk.   Baseline  9/7: 14.4 sec with RW, 18.5 sec with SBQC; 11/30=16.85 sec without an AD.   Time 12   Period Weeks   Status PROGRESSING  Target Date 11/05/2022   PT LONG TERM GOAL #7  Title Patient will increase six minute walk test distance to >700 ft for improved gait ability and increased ease with community participation.  Baseline 9/7: completes 4 minutes and 444 ft with RW. Test discontinued at 4 minutes due to [place]. 08/11/2022= patient ambulated 200 feet yet required 1 seated  rest break- due to BLE fatigue- using SBQC  Time 12   Period Weeks   Status ONGOING  Target Date 11/03/2022   PT LONG TERM GOAL #8  Title Pt will decrease 5TSTS by at least 2 seconds (without UE support)  in order to demonstrate clinically significant improvement in LE strength.   Baseline 08/11/2022= 16.33 sec with B hands on knees  Time 12   Period Weeks  Status NEW  Target Date 11/03/2022            Plan -     Clinical Impression Statement Continuing PT POC with focus on dynamic balance and strengthening. Patient continues to present with fatigue and postural/LE muscle weakness. He was able to take short breaks and progress from beginning step over with small height to increased height but very fatigued at end of session.  Pt will continue to benefit from skilled physical therapy intervention to address impairments, improve QOL, and attain therapy goals.    Personal Factors and Comorbidities Age;Past/Current Experience;Time since onset of injury/illness/exacerbation;Comorbidity 3+    Comorbidities COPD, History of Cancer, myasthenia gravis, chronic lumbar surgical history    Examination-Activity Limitations Lift;Squat;Bend;Stairs;Stand;Transfers;Insurance claims handler;Bathing;Hygiene/Grooming;Dressing;Toileting;Bed Mobility;Caring for Others;Continence    Examination-Participation Restrictions Yard Work;Church;Cleaning;Driving;Community Activity    Stability/Clinical Decision Making Evolving/Moderate complexity    Rehab Potential Good    Clinical Impairments Affecting Rehab Potential multipe comorbidities  PT Frequency 2x / week    PT Duration 12 weeks    PT Treatment/Interventions ADLs/Self Care Home Management;Electrical Stimulation;Moist Heat;Traction;Ultrasound;Gait training;Stair training;Functional mobility training;Therapeutic activities;Therapeutic exercise;Balance training;Neuromuscular re-education;Patient/family education;Manual techniques;Passive range of  motion;Dry needling;Joint Manipulations;Cryotherapy;Vestibular;Canalith Repostioning    PT Next Visit Plan standing strengthening and balance, seated strengthening and coordination    PT Home Exercise Plan Progress LE strengthen and functional endurance, continue plan   Consulted and Agree with Plan of Care Patient            Ollen Bowl, PT Physical Therapist- [facility]  09/09/22, 7:42 AM

## 2022-09-13 ENCOUNTER — Ambulatory Visit: Payer: Medicare Other

## 2022-09-15 ENCOUNTER — Ambulatory Visit: Payer: Medicare Other | Attending: Neurosurgery

## 2022-09-15 DIAGNOSIS — G959 Disease of spinal cord, unspecified: Secondary | ICD-10-CM | POA: Diagnosis present

## 2022-09-15 DIAGNOSIS — R278 Other lack of coordination: Secondary | ICD-10-CM | POA: Diagnosis present

## 2022-09-15 DIAGNOSIS — R262 Difficulty in walking, not elsewhere classified: Secondary | ICD-10-CM | POA: Diagnosis present

## 2022-09-15 DIAGNOSIS — M6281 Muscle weakness (generalized): Secondary | ICD-10-CM | POA: Diagnosis not present

## 2022-09-15 DIAGNOSIS — R2689 Other abnormalities of gait and mobility: Secondary | ICD-10-CM | POA: Diagnosis present

## 2022-09-15 DIAGNOSIS — M545 Low back pain, unspecified: Secondary | ICD-10-CM | POA: Insufficient documentation

## 2022-09-15 DIAGNOSIS — R296 Repeated falls: Secondary | ICD-10-CM | POA: Diagnosis present

## 2022-09-15 DIAGNOSIS — G8929 Other chronic pain: Secondary | ICD-10-CM | POA: Insufficient documentation

## 2022-09-15 DIAGNOSIS — R2681 Unsteadiness on feet: Secondary | ICD-10-CM | POA: Diagnosis present

## 2022-09-15 NOTE — Therapy (Signed)
OUTPATIENT PHYSICAL THERAPY TREATMENT NOTE/Physical Therapy Progress Note   Dates of reporting period  07/21/2022   to   09/15/2022      Patient Name: Francisco [name] MRN: [id number] DOB:1946/08/19, 77 y.o., male Today's Date: 09/16/2022  PCP: Adin Hector, MD REFERRING PROVIDER: Meade Maw MD   PT End of Session - 09/15/22 1520     Visit Number 40    Number of Visits 55    Date for PT Re-Evaluation 11/03/22    Authorization Time Period 08/11/22-11/03/2022    Progress Note Due on Visit 14    PT Start Time 1515    PT Stop Time 1555    PT Time Calculation (min) 40 min    Equipment Utilized During Treatment Gait belt    Activity Tolerance Patient tolerated treatment well    Behavior During Therapy WFL for tasks assessed/performed                Past Medical History:  Diagnosis Date   Arthritis    lower left hip   Atypical angina    Bilateral hand numbness    from back surgery   Bronchitis, chronic (HCC)    Cancer ([place])    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; Merkel cell cancer, and Basal cell cancer (twice; 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   Carotid stenosis    CHF (congestive heart failure) (HCC)    CKD (chronic kidney disease)    CKD (chronic kidney disease) stage 3, GFR 30-59 ml/min (HCC)    COPD (chronic obstructive pulmonary disease) ([place])    stage 2   DDD (degenerative disc disease), cervical    Dysrhythmia    post carotid stent bradycardia; PAF 09/2020   GERD (gastroesophageal reflux disease)    Hypercholesterolemia    Hypertension    Hypothyroidism    pt takes Levothyroxine daily   Lumbosacral spinal stenosis    Myasthenia gravis, adult form ([place])    PAD (peripheral artery disease) ([place])    Shortness of breath    Lung MD- Dr Darlin Coco   Sleep apnea    do not use CPAP every night   Past Surgical History:  Procedure Laterality Date   ANTERIOR CERVICAL DECOMP/DISCECTOMY FUSION  07/18/2011   Procedure: ANTERIOR CERVICAL DECOMPRESSION/DISCECTOMY FUSION 2  LEVELS;  Surgeon: Cooper Render Pool;  Location: [facility];  Service: Neurosurgery;  Laterality: N/A;  cervical five-six, cervical six-seven anterior cervical discectomy and fusion   BACK SURGERY     in [place]     01/2020 Right, 04/2020 Left   CARDIAC CATHETERIZATION     2005 at [facility], no stents   CAROTID PTA/STENT INTERVENTION N/A 09/17/2020   Procedure: CAROTID PTA/STENT INTERVENTION;  Surgeon: Algernon Huxley, MD;  Location: [place] [facility];  Service: Cardiovascular;  Laterality: N/A;   CATARACT EXTRACTION W/PHACO Left 01/06/2020   Procedure: CATARACT EXTRACTION PHACO AND INTRAOCULAR LENS PLACEMENT (IOC) ISTENT INJ LEFT 3.81  00:33.3;  Surgeon: Eulogio Bear, MD;  Location: [place];  Service: Ophthalmology;  Laterality: Left;   CATARACT EXTRACTION W/PHACO Right 02/03/2020   Procedure: CATARACT EXTRACTION PHACO AND INTRAOCULAR LENS PLACEMENT ([place]) RIGHT ISTENT INJ;  Surgeon: Eulogio Bear, MD;  Location: [place];  Service: Ophthalmology;  Laterality: Right;  4.29 0:35.6   COLONOSCOPY     HERNIA REPAIR Left    inguinal hernia repair in [place] MICRODISCECTOMY Left 02/24/2014   Procedure: LUMBAR LAMINECTOMY/DECOMPRESSION MICRODISCECTOMY  LUMBAR THREE-FOUR, FOUR-FIVE, LEFT FIVE-SACRAL ONE ;  Surgeon: Charlie Pitter, MD;  Location: [facility];  Service: Neurosurgery;  Laterality: Left;  LUMBAR LAMINECTOMY/DECOMPRESSION MICRODISCECTOMY LUMBAR THREE-FOUR, FOUR-FIVE, LEFT FIVE-SACRAL ONE    LUMBAR LAMINECTOMY/DECOMPRESSION MICRODISCECTOMY N/A 05/03/2021   Procedure: Laminectomy and Foraminotomy - L2-L3;  Surgeon: Earnie Larsson, MD;  Location: [facility];  Service: Neurosurgery;  Laterality: N/A;  3C   POSTERIOR CERVICAL FUSION/FORAMINOTOMY N/A 08/07/2020   Procedure: C3-6 POSTERIOR FUSION WITH DECOMPRESSION;  Surgeon: Meade Maw, MD;  Location: [facility];  Service: Neurosurgery;  Laterality: N/A;    PROSTATECTOMY  8/14   [facility] Dr Mare Ferrari    Patient Active Problem List   Diagnosis Date Noted   SVT (supraventricular tachycardia) 12/30/2021   Hypothyroidism 12/30/2021   Parkinson's disease 12/30/2021   Elevated troponin 12/30/2021   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   Acquired thrombophilia ([place]) 01/13/2021   History of decompression of median nerve 01/01/2021   Paroxysmal atrial fibrillation ([place]) 10/06/2020   Carotid stenosis, right 09/17/2020   S/P cervical spinal fusion    Leukocytosis    Essential hypertension    Anemia of chronic disease    Postoperative pain    Neuropathic pain    Cervical myelopathy ([place]) 08/07/2020   Preop cardiovascular exam 07/17/2020   SOB (shortness of breath) on exertion 07/17/2020   Leg weakness, bilateral 07/13/2020   Aortic atherosclerosis ([place]) 07/09/2020   Body mass index (BMI) 34.0-34.9, adult 12/25/2019   Myalgia 10/30/2019   Lumbar post-laminectomy syndrome 10/24/2019   PAD (peripheral artery disease) ([place]) 06/06/2019   CKD (chronic kidney disease) stage 3, GFR 30-59 ml/min (HCC) 04/29/2019   B12 deficiency 01/23/2019   Left arm numbness 02/28/2018   Neck pain 02/28/2018   Anemia 02/02/2017   DDD (degenerative disc disease), cervical 02/02/2017   Hypothyroid 02/02/2017   MRSA (methicillin resistant staph aureus) culture positive 02/02/2017   Nocturnal hypoxia 02/02/2017   Senile purpura ([place]) 10/03/2016   Essential hypertension, benign 09/16/2016   Bilateral carotid artery disease ([place]) 09/16/2016   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/18/2016   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   History of prostate cancer 12/21/2015   Left carpal tunnel syndrome 11/11/2015   Kidney stone on left side 07/05/2015   Myasthenia gravis ([place]) 05/26/2015   Radiculitis 01/05/2015   Long-term use of high-risk medication 10/15/2014   Persistent cough 09/10/2014   Pure hypercholesterolemia 07/25/2014   Spinal stenosis,  lumbar region, with neurogenic claudication 02/24/2014   Lumbosacral stenosis with neurogenic claudication 02/24/2014   COPD (chronic obstructive pulmonary disease) ([place]) 01/22/2014    REFERRING DIAG: balance disorder   THERAPY DIAG:  Muscle weakness (generalized)  Difficulty in walking, not elsewhere classified  Unsteadiness on feet  Other abnormalities of gait and mobility  Other lack of coordination  Chronic bilateral low back pain, unspecified whether sciatica present  Repeated falls  Rationale for Evaluation and Treatment Rehabilitation  PERTINENT HISTORY: Zebadiah Willert is a 77 year old male referred to [facility] neuro for difficulty with balance. He was also just recently dx in May 2023 with Impingement syndrome of left shoulder (M75.42) Impingement syndrome of left shoulder (primary encounter diagnosis) Left shoulder pain, unspecified chronicity Nontraumatic rupture of left proximal biceps tendon Past medical hx includes Lumbar laminectomy 04/2021. C3-6 decompression, fusion on 11/26, myasthenia gravis, 2012 ACDF 5/6, 6/7; OSA, hypothyroidism, HTN, COPD, multiple back surgeries, 2021 carpal tunnel release.  PRECAUTIONS: Fall  SUBJECTIVE:  Pt reports finally feeling a little better after a  few weeks of extreme weakness PAIN:  Are you having pain? No   TODAY'S TREATMENT: 09/15/2022   There.ex:  Sit to stand and walk approx 8 feet to support bar and back with no device to warm up legs  Dynamic quick march (seated) x 20 reps  LAQ Alt LE x 15 reps  Retested goals for progress note # 40 - see goal section for details   Seated calf raises x 20 reps (toes on 1/2 foam roll) Seated toe raises x 20 reps (Heels on foam roll)   Postural Strengthening: -horizontal shoulder abd with BTB 1 set x 10 reps; 1 set of 20 reps  -Scap retraction with BTB 2 reps of 10 reps  - wall standing- Lumbar flex/ext x 15 reps *Patient very fatigued after session from walking and  exercises         PATIENT EDUCATION: Education details: Goals, plan, exercise technique, body mechanics, energy conservation techniques  Person educated: Patient Education method: Explanation, Demonstration, Tactile cues, Verbal cues, and Handouts Education comprehension: verbalized understanding, returned demonstration, verbal cues required, and tactile cues required   HOME EXERCISE PROGRAM:  No Updates today  Access Code: [email or web address] URL: [email or web address] Date: 03/01/2022 Prepared by: Janna Arch  Exercises - Seated March  - 1 x daily - 7 x weekly - 2 sets - 10 reps - 5 hold - Seated Long Arc Quad  - 1 x daily - 7 x weekly - 2 sets - 10 reps - 5 hold - Seated Hip Abduction  - 1 x daily - 7 x weekly - 2 sets - 10 reps - 5 hold - Seated Hip Adduction Isometrics with Ball  - 1 x daily - 7 x weekly - 2 sets - 10 reps - 5 hold - Seated Gluteal Sets  - 1 x daily - 7 x weekly - 2 sets - 10 reps - 5 hold - Seated Heel Toe Raises  - 1 x daily - 7 x weekly - 2 sets - 10 reps - 5 hold LE strength and balance         PT Short Term Goals       PT SHORT TERM GOAL #1   Title Pt will be independent with initial HEP in order to improve strength and balance in order to decrease fall risk and improve function at home and work.    Baseline 02/23/2022- Patient reports not doing much as far as exercise in the home currently. 04/20/2022- Patient reports compliant with HEP and no questions at this time.; 9/7 pt indep   Time 6    Period Weeks    Status Goal met   Target Date 04/06/22              PT Long Term Goals       PT LONG TERM GOAL #1   Title Pt will be independent with final HEP in order to improve strength and balance in order to decrease fall risk and improve function at home and work. 04/20/2022- Patient reports compliant with HEP and no questions at this time.    Baseline 02/23/2022= No formal HEP in place. 9/7: pt performing at least once a day, feels  comfortable   Time 12    Period Weeks    Status GOAL MET   Target Date 08/11/2022      PT LONG TERM GOAL #2   Title Pt will decrease 5TSTS by at least 8 seconds in order to demonstrate clinically significant improvement in LE  strength.    Baseline 02/23/2022= 31.52 sec with B hands on knees; 04/20/2022= 16.08 sec with B hands on knees   Time 12    Period Days    Status Goal Met   Target Date 05/18/22      PT LONG TERM GOAL #3   Title Pt will increase 10MWT by at least 0.23 m/s in order to demonstrate clinically significant improvement in community ambulation.    Baseline 02/23/2022= 0.39 m/s using RW; 0.45 m/s; 9/7: 0.37 m/s with QC, 0.74 m/s with RW   Time 12    Period Weeks    Status GOAL MET   Target Date 05/18/22      PT LONG TERM GOAL #4   Title Pt will improve FOTO to target score of 50 to display perceived improvements in ability to complete ADL's.    Baseline 02/23/2022= 47; 9/7: 49; 11/30=54%   Time 12    Period Weeks    Status GOAL MET   Target Date 08/11/2022      PT LONG TERM GOAL #5   Title Pt will decrease TUG to below 20 seconds/decrease in order to demonstrate decreased fall risk.    Baseline 02/23/2022=28.27 sec with RW; 04/20/2022= 20.22 with SBQC; 9/7: 14.4 sec with RW, 18.5 sec with SBQC   Time 12    Period Weeks    Status MET   Target Date 05/18/22    PT LONG TERM GOAL #6  Title Pt will decrease TUG to 14 sec or below with SBQC in order to demonstrate decreased fall risk.   Baseline  9/7: 14.4 sec with RW, 18.5 sec with SBQC; 11/30=16.85 sec without an AD. 09/15/2022= 22.14 sec with SPC  Time 12   Period Weeks   Status ONGOING  Target Date 11/05/2022   PT LONG TERM GOAL #7  Title Patient will increase six minute walk test distance to >700 ft for improved gait ability and increased ease with community participation.  Baseline 9/7: completes 4 minutes and 444 ft with RW. Test discontinued at 4 minutes due to [place]. 08/11/2022= patient ambulated 200 feet yet  required 1 seated rest break- due to BLE fatigue- using SBQC: 09/15/2022= Patient ambulated 300 feet in 4 min 20 sec with 1 seated rest break using   Time 12   Period Weeks   Status ONGOING  Target Date 11/03/2022   PT LONG TERM GOAL #8  Title Pt will decrease 5TSTS by at least 2 seconds (without UE support)  in order to demonstrate clinically significant improvement in LE strength.   Baseline 08/11/2022= 16.33 sec with B hands on knees; 09/15/2022= 22 sec with B hands on knees (patient reports increased weakness since around Christmas and just has not bounced back)   Time 12   Period Weeks  Status ONGOING  Target Date 11/03/2022            Plan -     Clinical Impression Statement Patient presents with good motivation yet not feeling well- reporting ongoing weakness yet states he will do his best for progress note. He presented with decline in LE strength and functional endurance activities including 6 min walk. Unclear for recent decline other than patient has not been feeling well lately. Will follow up with his Neurologist next week to further evaluate. Patient's condition has the potential to improve in response to therapy. Maximum improvement is yet to be obtained. The anticipated improvement is attainable and reasonable in a generally predictable time.  Patient reports  Pt will continue to benefit from skilled physical therapy intervention to address impairments, improve QOL, and attain therapy goals.    Personal Factors and Comorbidities Age;Past/Current Experience;Time since onset of injury/illness/exacerbation;Comorbidity 3+    Comorbidities COPD, History of Cancer, myasthenia gravis, chronic lumbar surgical history    Examination-Activity Limitations Lift;Squat;Bend;Stairs;Stand;Transfers;Insurance claims handler;Bathing;Hygiene/Grooming;Dressing;Toileting;Bed Mobility;Caring for Others;Continence    Examination-Participation Restrictions Yard  Work;Church;Cleaning;Driving;Community Activity    Stability/Clinical Decision Making Evolving/Moderate complexity    Rehab Potential Good    Clinical Impairments Affecting Rehab Potential multipe comorbidities    PT Frequency 2x / week    PT Duration 12 weeks    PT Treatment/Interventions ADLs/Self Care Home Management;Electrical Stimulation;Moist Heat;Traction;Ultrasound;Gait training;Stair training;Functional mobility training;Therapeutic activities;Therapeutic exercise;Balance training;Neuromuscular re-education;Patient/family education;Manual techniques;Passive range of motion;Dry needling;Joint Manipulations;Cryotherapy;Vestibular;Canalith Repostioning    PT Next Visit Plan standing strengthening and balance, seated strengthening and coordination    PT Home Exercise Plan Progress LE strengthen and functional endurance, continue plan   Consulted and Agree with Plan of Care Patient            Ollen Bowl, PT Physical Therapist- [facility]  09/16/22, 9:19 AM

## 2022-09-20 ENCOUNTER — Ambulatory Visit: Payer: Medicare Other

## 2022-09-20 DIAGNOSIS — M6281 Muscle weakness (generalized): Secondary | ICD-10-CM | POA: Diagnosis not present

## 2022-09-20 DIAGNOSIS — R278 Other lack of coordination: Secondary | ICD-10-CM

## 2022-09-20 DIAGNOSIS — R2681 Unsteadiness on feet: Secondary | ICD-10-CM

## 2022-09-20 DIAGNOSIS — R262 Difficulty in walking, not elsewhere classified: Secondary | ICD-10-CM

## 2022-09-20 DIAGNOSIS — R296 Repeated falls: Secondary | ICD-10-CM

## 2022-09-20 DIAGNOSIS — G8929 Other chronic pain: Secondary | ICD-10-CM

## 2022-09-20 DIAGNOSIS — R2689 Other abnormalities of gait and mobility: Secondary | ICD-10-CM

## 2022-09-20 NOTE — Therapy (Signed)
OUTPATIENT PHYSICAL THERAPY TREATMENT NOTE     Patient Name: Francisco [name] MRN: [id number] DOB:02-16-1946, 77 y.o., male Today's Date: 09/20/2022  PCP: Adin Hector, MD REFERRING PROVIDER: Meade Maw MD   PT End of Session - 09/20/22 1520     Visit Number 41    Number of Visits 21    Date for PT Re-Evaluation 11/03/22    Authorization Time Period 08/11/22-11/03/2022    Progress Note Due on Visit 20    PT Start Time 1516    Equipment Utilized During Treatment Gait belt    Activity Tolerance Patient tolerated treatment well    Behavior During Therapy [facility] for tasks assessed/performed                 Past Medical History:  Diagnosis Date   Arthritis    lower left hip   Atypical angina    Bilateral hand numbness    from back surgery   Bronchitis, chronic (HCC)    Cancer ([place])    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; Merkel cell cancer, and Basal cell cancer (twice; 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   Carotid stenosis    CHF (congestive heart failure) (HCC)    CKD (chronic kidney disease)    CKD (chronic kidney disease) stage 3, GFR 30-59 ml/min (HCC)    COPD (chronic obstructive pulmonary disease) ([place])    stage 2   DDD (degenerative disc disease), cervical    Dysrhythmia    post carotid stent bradycardia; PAF 09/2020   GERD (gastroesophageal reflux disease)    Hypercholesterolemia    Hypertension    Hypothyroidism    pt takes Levothyroxine daily   Lumbosacral spinal stenosis    Myasthenia gravis, adult form ([place])    PAD (peripheral artery disease) ([place])    Shortness of breath    Lung MD- Dr Darlin Coco   Sleep apnea    do not use CPAP every night   Past Surgical History:  Procedure Laterality Date   ANTERIOR CERVICAL DECOMP/DISCECTOMY FUSION  07/18/2011   Procedure: ANTERIOR CERVICAL DECOMPRESSION/DISCECTOMY FUSION 2 LEVELS;  Surgeon: Cooper Render Pool;  Location: [place] [facility];  Service: Neurosurgery;  Laterality: N/A;  cervical five-six, cervical six-seven anterior  cervical discectomy and fusion   BACK SURGERY     in [place]     01/2020 Right, 04/2020 Left   CARDIAC CATHETERIZATION     2005 at [facility], no stents   CAROTID PTA/STENT INTERVENTION N/A 09/17/2020   Procedure: CAROTID PTA/STENT INTERVENTION;  Surgeon: Algernon Huxley, MD;  Location: [place] [facility];  Service: Cardiovascular;  Laterality: N/A;   CATARACT EXTRACTION W/PHACO Left 01/06/2020   Procedure: CATARACT EXTRACTION PHACO AND INTRAOCULAR LENS PLACEMENT (IOC) ISTENT INJ LEFT 3.81  00:33.3;  Surgeon: Eulogio Bear, MD;  Location: [place];  Service: Ophthalmology;  Laterality: Left;   CATARACT EXTRACTION W/PHACO Right 02/03/2020   Procedure: CATARACT EXTRACTION PHACO AND INTRAOCULAR LENS PLACEMENT ([place]) RIGHT ISTENT INJ;  Surgeon: Eulogio Bear, MD;  Location: [place];  Service: Ophthalmology;  Laterality: Right;  4.29 0:35.6   COLONOSCOPY     HERNIA REPAIR Left    inguinal hernia repair in 1985   LUMBAR LAMINECTOMY/DECOMPRESSION MICRODISCECTOMY Left 02/24/2014   Procedure: LUMBAR LAMINECTOMY/DECOMPRESSION MICRODISCECTOMY LUMBAR THREE-FOUR, FOUR-FIVE, LEFT FIVE-SACRAL ONE ;  Surgeon: Charlie Pitter, MD;  Location: [place] [facility];  Service: Neurosurgery;  Laterality: Left;  LUMBAR LAMINECTOMY/DECOMPRESSION MICRODISCECTOMY LUMBAR THREE-FOUR, FOUR-FIVE, LEFT FIVE-SACRAL  ONE    LUMBAR LAMINECTOMY/DECOMPRESSION MICRODISCECTOMY N/A 05/03/2021   Procedure: Laminectomy and Foraminotomy - L2-L3;  Surgeon: Earnie Larsson, MD;  Location: [place];  Service: Neurosurgery;  Laterality: N/A;  3C   POSTERIOR CERVICAL FUSION/FORAMINOTOMY N/A 08/07/2020   Procedure: C3-6 POSTERIOR FUSION WITH DECOMPRESSION;  Surgeon: Meade Maw, MD;  Location: [facility];  Service: Neurosurgery;  Laterality: N/A;   PROSTATECTOMY  8/14   [facility] Dr Mare Ferrari    Patient Active Problem List   Diagnosis Date Noted   SVT (supraventricular tachycardia)  12/30/2021   Hypothyroidism 12/30/2021   Parkinson's disease 12/30/2021   Elevated troponin 12/30/2021   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   Acquired thrombophilia ([place]) 01/13/2021   History of decompression of median nerve 01/01/2021   Paroxysmal atrial fibrillation ([place]) 10/06/2020   Carotid stenosis, right 09/17/2020   S/P cervical spinal fusion    Leukocytosis    Essential hypertension    Anemia of chronic disease    Postoperative pain    Neuropathic pain    Cervical myelopathy ([place]) 08/07/2020   Preop cardiovascular exam 07/17/2020   SOB (shortness of breath) on exertion 07/17/2020   Leg weakness, bilateral 07/13/2020   Aortic atherosclerosis ([place]) 07/09/2020   Body mass index (BMI) 34.0-34.9, adult 12/25/2019   Myalgia 10/30/2019   Lumbar post-laminectomy syndrome 10/24/2019   PAD (peripheral artery disease) ([place]) 06/06/2019   CKD (chronic kidney disease) stage 3, GFR 30-59 ml/min (HCC) 04/29/2019   B12 deficiency 01/23/2019   Left arm numbness 02/28/2018   Neck pain 02/28/2018   Anemia 02/02/2017   DDD (degenerative disc disease), cervical 02/02/2017   Hypothyroid 02/02/2017   MRSA (methicillin resistant staph aureus) culture positive 02/02/2017   Nocturnal hypoxia 02/02/2017   Senile purpura ([place]) 10/03/2016   Essential hypertension, benign 09/16/2016   Bilateral carotid artery disease ([place]) 09/16/2016   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/18/2016   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   History of prostate cancer 12/21/2015   Left carpal tunnel syndrome 11/11/2015   Kidney stone on left side 07/05/2015   Myasthenia gravis ([place]) 05/26/2015   Radiculitis 01/05/2015   Long-term use of high-risk medication 10/15/2014   Persistent cough 09/10/2014   Pure hypercholesterolemia 07/25/2014   Spinal stenosis, lumbar region, with neurogenic claudication 02/24/2014   Lumbosacral stenosis with neurogenic claudication 02/24/2014   COPD (chronic  obstructive pulmonary disease) ([place]) 01/22/2014    REFERRING DIAG: balance disorder   THERAPY DIAG:  Muscle weakness (generalized)  Difficulty in walking, not elsewhere classified  Unsteadiness on feet  Other abnormalities of gait and mobility  Other lack of coordination  Chronic bilateral low back pain, unspecified whether sciatica present  Repeated falls  Rationale for Evaluation and Treatment Rehabilitation  PERTINENT HISTORY: Kelvis Berger is a 77 year old male referred to [facility] neuro for difficulty with balance. He was also just recently dx in May 2023 with Impingement syndrome of left shoulder (M75.42) Impingement syndrome of left shoulder (primary encounter diagnosis) Left shoulder pain, unspecified chronicity Nontraumatic rupture of left proximal biceps tendon Past medical hx includes Lumbar laminectomy 04/2021. C3-6 decompression, fusion on 11/26, myasthenia gravis, 2012 ACDF 5/6, 6/7; OSA, hypothyroidism, HTN, COPD, multiple back surgeries, 2021 carpal tunnel release.  PRECAUTIONS: Fall  SUBJECTIVE:  Patient reports having 2 really good days and states today is like a 7/10 - a good day.    PAIN:  Are you having pain? Yes- Low back 3/10- ache- controlled by hydrocodone   TODAY'S TREATMENT: 09/20/2022  There.ex:   Circuit style workout -  Seated hip march 2.5# with alt LE - 2 sets x 12 reps Seated knee ext- 2.5# with alt LE - 2 sets x 12 reps Ambulation  x 60 feet without device 2.5#  (CGA with gait belt)   Seated scap retraction Seated shoulder ext Ambulation x 60 feet with 2.5 AW  Standing hip abd with 2.5# AW Standing hip ext with 2.5# AW Ambulation x 60 feet with 2.5 AW  Standing lumbar flex into Ext at support bar x 10 reps (patient reports as fatiguing)  Seated chair dips x  Sit to stand without UE support x 10 reps  Ambulation     Sit to stand and walk approx 8 feet to support bar and back with no device to warm up legs  Dynamic quick march  (seated) x 20 reps  LAQ Alt LE x 15 reps  Retested goals for progress note # 40 - see goal section for details   Seated calf raises x 20 reps (toes on 1/2 foam roll) Seated toe raises x 20 reps (Heels on foam roll)   Postural Strengthening: -horizontal shoulder abd with BTB 1 set x 10 reps; 1 set of 20 reps  -Scap retraction with BTB 2 reps of 10 reps  - wall standing- Lumbar flex/ext x 15 reps *Patient very fatigued after session from walking and exercises         PATIENT EDUCATION: Education details: Goals, plan, exercise technique, body mechanics, energy conservation techniques  Person educated: Patient Education method: Explanation, Demonstration, Tactile cues, Verbal cues, and Handouts Education comprehension: verbalized understanding, returned demonstration, verbal cues required, and tactile cues required   HOME EXERCISE PROGRAM:  No Updates today  Access Code: [email or web address] URL: [email or web address] Date: 03/01/2022 Prepared by: Janna Arch  Exercises - Seated March  - 1 x daily - 7 x weekly - 2 sets - 10 reps - 5 hold - Seated Long Arc Quad  - 1 x daily - 7 x weekly - 2 sets - 10 reps - 5 hold - Seated Hip Abduction  - 1 x daily - 7 x weekly - 2 sets - 10 reps - 5 hold - Seated Hip Adduction Isometrics with Ball  - 1 x daily - 7 x weekly - 2 sets - 10 reps - 5 hold - Seated Gluteal Sets  - 1 x daily - 7 x weekly - 2 sets - 10 reps - 5 hold - Seated Heel Toe Raises  - 1 x daily - 7 x weekly - 2 sets - 10 reps - 5 hold LE strength and balance         PT Short Term Goals       PT SHORT TERM GOAL #1   Title Pt will be independent with initial HEP in order to improve strength and balance in order to decrease fall risk and improve function at home and work.    Baseline 02/23/2022- Patient reports not doing much as far as exercise in the home currently. 04/20/2022- Patient reports compliant with HEP and no questions at this time.; 9/7 pt indep   Time 6     Period Weeks    Status Goal met   Target Date 04/06/22              PT Long Term Goals       PT LONG TERM GOAL #1   Title Pt will be independent with final HEP in order to improve strength  and balance in order to decrease fall risk and improve function at home and work. 04/20/2022- Patient reports compliant with HEP and no questions at this time.    Baseline 02/23/2022= No formal HEP in place. 9/7: pt performing at least once a day, feels comfortable   Time 12    Period Weeks    Status GOAL MET   Target Date 08/11/2022      PT LONG TERM GOAL #2   Title Pt will decrease 5TSTS by at least 8 seconds in order to demonstrate clinically significant improvement in LE strength.    Baseline 02/23/2022= 31.52 sec with B hands on knees; 04/20/2022= 16.08 sec with B hands on knees   Time 12    Period Days    Status Goal Met   Target Date 05/18/22      PT LONG TERM GOAL #3   Title Pt will increase 10MWT by at least 0.23 m/s in order to demonstrate clinically significant improvement in community ambulation.    Baseline 02/23/2022= 0.39 m/s using RW; 0.45 m/s; 9/7: 0.37 m/s with QC, 0.74 m/s with RW   Time 12    Period Weeks    Status GOAL MET   Target Date 05/18/22      PT LONG TERM GOAL #4   Title Pt will improve FOTO to target score of 50 to display perceived improvements in ability to complete ADL's.    Baseline 02/23/2022= 47; 9/7: 49; 11/30=54%   Time 12    Period Weeks    Status GOAL MET   Target Date 08/11/2022      PT LONG TERM GOAL #5   Title Pt will decrease TUG to below 20 seconds/decrease in order to demonstrate decreased fall risk.    Baseline 02/23/2022=28.27 sec with RW; 04/20/2022= 20.22 with SBQC; 9/7: 14.4 sec with RW, 18.5 sec with SBQC   Time 12    Period Weeks    Status MET   Target Date 05/18/22    PT LONG TERM GOAL #6  Title Pt will decrease TUG to 14 sec or below with SBQC in order to demonstrate decreased fall risk.   Baseline  9/7: 14.4 sec with RW, 18.5 sec  with SBQC; 11/30=16.85 sec without an AD. 09/15/2022= 22.14 sec with SPC  Time 12   Period Weeks   Status ONGOING  Target Date 11/05/2022   PT LONG TERM GOAL #7  Title Patient will increase six minute walk test distance to >700 ft for improved gait ability and increased ease with community participation.  Baseline 9/7: completes 4 minutes and 444 ft with RW. Test discontinued at 4 minutes due to [place]. 08/11/2022= patient ambulated 200 feet yet required 1 seated rest break- due to BLE fatigue- using SBQC: 09/15/2022= Patient ambulated 300 feet in 4 min 20 sec with 1 seated rest break using   Time 12   Period Weeks   Status ONGOING  Target Date 11/03/2022   PT LONG TERM GOAL #8  Title Pt will decrease 5TSTS by at least 2 seconds (without UE support)  in order to demonstrate clinically significant improvement in LE strength.   Baseline 08/11/2022= 16.33 sec with B hands on knees; 09/15/2022= 22 sec with B hands on knees (patient reports increased weakness since around Christmas and just has not bounced back)   Time 12   Period Weeks  Status ONGOING  Target Date 11/03/2022            Plan -  Clinical Impression Statement Patient presents with good motivation yet not feeling well- reporting ongoing weakness yet states he will do his best for progress note. He presented with decline in LE strength and functional endurance activities including 6 min walk. Unclear for recent decline other than patient has not been feeling well lately. Will follow up with his Neurologist next week to further evaluate. Patient's condition has the potential to improve in response to therapy. Maximum improvement is yet to be obtained. The anticipated improvement is attainable and reasonable in a generally predictable time.  Patient reports  Pt will continue to benefit from skilled physical therapy intervention to address impairments, improve QOL, and attain therapy goals.    Personal Factors and Comorbidities  Age;Past/Current Experience;Time since onset of injury/illness/exacerbation;Comorbidity 3+    Comorbidities COPD, History of Cancer, myasthenia gravis, chronic lumbar surgical history    Examination-Activity Limitations Lift;Squat;Bend;Stairs;Stand;Transfers;Insurance claims handler;Bathing;Hygiene/Grooming;Dressing;Toileting;Bed Mobility;Caring for Others;Continence    Examination-Participation Restrictions Yard Work;Church;Cleaning;Driving;Community Activity    Stability/Clinical Decision Making Evolving/Moderate complexity    Rehab Potential Good    Clinical Impairments Affecting Rehab Potential multipe comorbidities    PT Frequency 2x / week    PT Duration 12 weeks    PT Treatment/Interventions ADLs/Self Care Home Management;Electrical Stimulation;Moist Heat;Traction;Ultrasound;Gait training;Stair training;Functional mobility training;Therapeutic activities;Therapeutic exercise;Balance training;Neuromuscular re-education;Patient/family education;Manual techniques;Passive range of motion;Dry needling;Joint Manipulations;Cryotherapy;Vestibular;Canalith Repostioning    PT Next Visit Plan standing strengthening and balance, seated strengthening and coordination    PT Home Exercise Plan Progress LE strengthen and functional endurance, continue plan   Consulted and Agree with Plan of Care Patient            Ollen Bowl, PT Physical Therapist- [facility]  09/20/22, 3:23 PM

## 2022-09-22 ENCOUNTER — Ambulatory Visit: Payer: Medicare Other

## 2022-09-22 DIAGNOSIS — R262 Difficulty in walking, not elsewhere classified: Secondary | ICD-10-CM

## 2022-09-22 DIAGNOSIS — R278 Other lack of coordination: Secondary | ICD-10-CM

## 2022-09-22 DIAGNOSIS — G959 Disease of spinal cord, unspecified: Secondary | ICD-10-CM

## 2022-09-22 DIAGNOSIS — M6281 Muscle weakness (generalized): Secondary | ICD-10-CM | POA: Diagnosis not present

## 2022-09-22 DIAGNOSIS — R2681 Unsteadiness on feet: Secondary | ICD-10-CM

## 2022-09-22 DIAGNOSIS — R2689 Other abnormalities of gait and mobility: Secondary | ICD-10-CM

## 2022-09-22 DIAGNOSIS — R296 Repeated falls: Secondary | ICD-10-CM

## 2022-09-22 DIAGNOSIS — G8929 Other chronic pain: Secondary | ICD-10-CM

## 2022-09-22 NOTE — Therapy (Signed)
OUTPATIENT PHYSICAL THERAPY TREATMENT NOTE     Patient Name: Francisco [name] MRN: [id number] DOB:1946-04-06, 77 y.o., male Today's Date: 09/23/2022  PCP: Adin Hector, MD REFERRING PROVIDER: Meade Maw MD   PT End of Session - 09/22/22 1526     Visit Number 42    Number of Visits 64    Date for PT Re-Evaluation 11/03/22    Authorization Time Period 08/11/22-11/03/2022    Progress Note Due on Visit 63    PT Start Time 1516    PT Stop Time 1558    PT Time Calculation (min) 42 min    Equipment Utilized During Treatment Gait belt    Activity Tolerance Patient tolerated treatment well    Behavior During Therapy WFL for tasks assessed/performed                 Past Medical History:  Diagnosis Date   Arthritis    lower left hip   Atypical angina    Bilateral hand numbness    from back surgery   Bronchitis, chronic (HCC)    Cancer ([place])    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; Merkel cell cancer, and Basal cell cancer (twice; 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   Carotid stenosis    CHF (congestive heart failure) (HCC)    CKD (chronic kidney disease)    CKD (chronic kidney disease) stage 3, GFR 30-59 ml/min (HCC)    COPD (chronic obstructive pulmonary disease) ([place])    stage 2   DDD (degenerative disc disease), cervical    Dysrhythmia    post carotid stent bradycardia; PAF 09/2020   GERD (gastroesophageal reflux disease)    Hypercholesterolemia    Hypertension    Hypothyroidism    pt takes Levothyroxine daily   Lumbosacral spinal stenosis    Myasthenia gravis, adult form ([place])    PAD (peripheral artery disease) ([place])    Shortness of breath    Lung MD- Dr Darlin Coco   Sleep apnea    do not use CPAP every night   Past Surgical History:  Procedure Laterality Date   ANTERIOR CERVICAL DECOMP/DISCECTOMY FUSION  07/18/2011   Procedure: ANTERIOR CERVICAL DECOMPRESSION/DISCECTOMY FUSION 2 LEVELS;  Surgeon: Cooper Render Pool;  Location: [facility];  Service: Neurosurgery;   Laterality: N/A;  cervical five-six, cervical six-seven anterior cervical discectomy and fusion   BACK SURGERY     in [place]     01/2020 Right, 04/2020 Left   CARDIAC CATHETERIZATION     2005 at [facility], no stents   CAROTID PTA/STENT INTERVENTION N/A 09/17/2020   Procedure: CAROTID PTA/STENT INTERVENTION;  Surgeon: Algernon Huxley, MD;  Location: [place] [facility];  Service: Cardiovascular;  Laterality: N/A;   CATARACT EXTRACTION W/PHACO Left 01/06/2020   Procedure: CATARACT EXTRACTION PHACO AND INTRAOCULAR LENS PLACEMENT (IOC) ISTENT INJ LEFT 3.81  00:33.3;  Surgeon: Eulogio Bear, MD;  Location: [place];  Service: Ophthalmology;  Laterality: Left;   CATARACT EXTRACTION W/PHACO Right 02/03/2020   Procedure: CATARACT EXTRACTION PHACO AND INTRAOCULAR LENS PLACEMENT ([place]) RIGHT ISTENT INJ;  Surgeon: Eulogio Bear, MD;  Location: [facility];  Service: Ophthalmology;  Laterality: Right;  4.29 0:35.6   COLONOSCOPY     HERNIA REPAIR Left    inguinal hernia repair in [place] MICRODISCECTOMY Left 02/24/2014   Procedure: LUMBAR LAMINECTOMY/DECOMPRESSION MICRODISCECTOMY LUMBAR THREE-FOUR, FOUR-FIVE, LEFT FIVE-SACRAL ONE ;  Surgeon: Charlie Pitter, MD;  Location: [facility];  Service: Neurosurgery;  Laterality: Left;  LUMBAR LAMINECTOMY/DECOMPRESSION MICRODISCECTOMY LUMBAR THREE-FOUR, FOUR-FIVE, LEFT FIVE-SACRAL ONE    LUMBAR LAMINECTOMY/DECOMPRESSION MICRODISCECTOMY N/A 05/03/2021   Procedure: Laminectomy and Foraminotomy - L2-L3;  Surgeon: Earnie Larsson, MD;  Location: [facility];  Service: Neurosurgery;  Laterality: N/A;  3C   POSTERIOR CERVICAL FUSION/FORAMINOTOMY N/A 08/07/2020   Procedure: C3-6 POSTERIOR FUSION WITH DECOMPRESSION;  Surgeon: Meade Maw, MD;  Location: [facility];  Service: Neurosurgery;  Laterality: N/A;   PROSTATECTOMY  8/14   [facility] Dr Mare Ferrari    Patient Active Problem List    Diagnosis Date Noted   SVT (supraventricular tachycardia) 12/30/2021   Hypothyroidism 12/30/2021   Parkinson's disease 12/30/2021   Elevated troponin 12/30/2021   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   Acquired thrombophilia ([place]) 01/13/2021   History of decompression of median nerve 01/01/2021   Paroxysmal atrial fibrillation ([place]) 10/06/2020   Carotid stenosis, right 09/17/2020   S/P cervical spinal fusion    Leukocytosis    Essential hypertension    Anemia of chronic disease    Postoperative pain    Neuropathic pain    Cervical myelopathy ([place]) 08/07/2020   Preop cardiovascular exam 07/17/2020   SOB (shortness of breath) on exertion 07/17/2020   Leg weakness, bilateral 07/13/2020   Aortic atherosclerosis ([place]) 07/09/2020   Body mass index (BMI) 34.0-34.9, adult 12/25/2019   Myalgia 10/30/2019   Lumbar post-laminectomy syndrome 10/24/2019   PAD (peripheral artery disease) ([place]) 06/06/2019   CKD (chronic kidney disease) stage 3, GFR 30-59 ml/min (HCC) 04/29/2019   B12 deficiency 01/23/2019   Left arm numbness 02/28/2018   Neck pain 02/28/2018   Anemia 02/02/2017   DDD (degenerative disc disease), cervical 02/02/2017   Hypothyroid 02/02/2017   MRSA (methicillin resistant staph aureus) culture positive 02/02/2017   Nocturnal hypoxia 02/02/2017   Senile purpura ([place]) 10/03/2016   Essential hypertension, benign 09/16/2016   Bilateral carotid artery disease ([place]) 09/16/2016   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/18/2016   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   History of prostate cancer 12/21/2015   Left carpal tunnel syndrome 11/11/2015   Kidney stone on left side 07/05/2015   Myasthenia gravis ([place]) 05/26/2015   Radiculitis 01/05/2015   Long-term use of high-risk medication 10/15/2014   Persistent cough 09/10/2014   Pure hypercholesterolemia 07/25/2014   Spinal stenosis, lumbar region, with neurogenic claudication 02/24/2014   Lumbosacral stenosis  with neurogenic claudication 02/24/2014   COPD (chronic obstructive pulmonary disease) ([place]) 01/22/2014    REFERRING DIAG: balance disorder   THERAPY DIAG:  Muscle weakness (generalized)  Difficulty in walking, not elsewhere classified  Unsteadiness on feet  Other abnormalities of gait and mobility  Other lack of coordination  Chronic bilateral low back pain, unspecified whether sciatica present  Repeated falls  Cervical myelopathy (HCC)  Rationale for Evaluation and Treatment Rehabilitation  PERTINENT HISTORY: Tyon Cerasoli is a 77 year old male referred to [facility] neuro for difficulty with balance. He was also just recently dx in May 2023 with Impingement syndrome of left shoulder (M75.42) Impingement syndrome of left shoulder (primary encounter diagnosis) Left shoulder pain, unspecified chronicity Nontraumatic rupture of left proximal biceps tendon Past medical hx includes Lumbar laminectomy 04/2021. C3-6 decompression, fusion on 11/26, myasthenia gravis, 2012 ACDF 5/6, 6/7; OSA, hypothyroidism, HTN, COPD, multiple back surgeries, 2021 carpal tunnel release.  PRECAUTIONS: Fall  SUBJECTIVE:  Patient reports continuing to have a good week overall.   PAIN:  Are you having pain? Yes- Low back 3/10- ache-  controlled by hydrocodone   TODAY'S TREATMENT: 09/22/2022  NMR:   Blaze pods:  Activity Description: Standing in front of tray table at max height- 3 pods positioned on table and 3 on floor- Patient had 2 colors (red/green) and instructed to use right side of body for green and left side for left  Activity Setting:  The Blaze Pod Random setting was chosen to enhance cognitive processing and agility, providing an unpredictable environment to simulate real-world scenarios, and fostering quick reactions and adaptability.   Number of Pods:  6 Cycles/Sets:  5 Duration (Time or Hit Count):  15 hits  Patient Stats  Reaction Time:  1) 51.94 sec 2) 28.90 3) 25.9 4) 26.23 5) 21.33  sec  Activity Description: Standing with tray table on left side and computer stand (each with 2 pods on them) a stool in front with 1 pod and 1 on floor) - reaching with either right UE/LE with green and left UE/LE with red Activity Setting:  The Blaze Pod Random setting was chosen to enhance cognitive processing and agility, providing an unpredictable environment to simulate real-world scenarios, and fostering quick reactions and adaptability.   Number of Pods:  6 Cycles/Sets:  5 Duration (Time or Hit Count):  15  Patient Stats  Reaction Time:  1) 26.84 sec 2) 28.64 sec 3) 23.22 4) 22.22 5) 20.89   There.ex:   Nustep- L1 with LE only x 5 min for LE muscle strengthening.              PATIENT EDUCATION: Education details: Goals, plan, exercise technique, body mechanics, energy conservation techniques  Person educated: Patient Education method: Explanation, Demonstration, Tactile cues, Verbal cues, and Handouts Education comprehension: verbalized understanding, returned demonstration, verbal cues required, and tactile cues required   HOME EXERCISE PROGRAM:  No Updates today  Access Code: [email or web address] URL: [email or web address] Date: 03/01/2022 Prepared by: Janna Arch  Exercises - Seated March  - 1 x daily - 7 x weekly - 2 sets - 10 reps - 5 hold - Seated Long Arc Quad  - 1 x daily - 7 x weekly - 2 sets - 10 reps - 5 hold - Seated Hip Abduction  - 1 x daily - 7 x weekly - 2 sets - 10 reps - 5 hold - Seated Hip Adduction Isometrics with Ball  - 1 x daily - 7 x weekly - 2 sets - 10 reps - 5 hold - Seated Gluteal Sets  - 1 x daily - 7 x weekly - 2 sets - 10 reps - 5 hold - Seated Heel Toe Raises  - 1 x daily - 7 x weekly - 2 sets - 10 reps - 5 hold LE strength and balance         PT Short Term Goals       PT SHORT TERM GOAL #1   Title Pt will be independent with initial HEP in order to improve strength and balance in order to decrease fall risk and  improve function at home and work.    Baseline 02/23/2022- Patient reports not doing much as far as exercise in the home currently. 04/20/2022- Patient reports compliant with HEP and no questions at this time.; 9/7 pt indep   Time 6    Period Weeks    Status Goal met   Target Date 04/06/22              PT Long Term Goals       PT LONG TERM GOAL #  1   Title Pt will be independent with final HEP in order to improve strength and balance in order to decrease fall risk and improve function at home and work. 04/20/2022- Patient reports compliant with HEP and no questions at this time.    Baseline 02/23/2022= No formal HEP in place. 9/7: pt performing at least once a day, feels comfortable   Time 12    Period Weeks    Status GOAL MET   Target Date 08/11/2022      PT LONG TERM GOAL #2   Title Pt will decrease 5TSTS by at least 8 seconds in order to demonstrate clinically significant improvement in LE strength.    Baseline 02/23/2022= 31.52 sec with B hands on knees; 04/20/2022= 16.08 sec with B hands on knees   Time 12    Period Days    Status Goal Met   Target Date 05/18/22      PT LONG TERM GOAL #3   Title Pt will increase 10MWT by at least 0.23 m/s in order to demonstrate clinically significant improvement in community ambulation.    Baseline 02/23/2022= 0.39 m/s using RW; 0.45 m/s; 9/7: 0.37 m/s with QC, 0.74 m/s with RW   Time 12    Period Weeks    Status GOAL MET   Target Date 05/18/22      PT LONG TERM GOAL #4   Title Pt will improve FOTO to target score of 50 to display perceived improvements in ability to complete ADL's.    Baseline 02/23/2022= 47; 9/7: 49; 11/30=54%   Time 12    Period Weeks    Status GOAL MET   Target Date 08/11/2022      PT LONG TERM GOAL #5   Title Pt will decrease TUG to below 20 seconds/decrease in order to demonstrate decreased fall risk.    Baseline 02/23/2022=28.27 sec with RW; 04/20/2022= 20.22 with SBQC; 9/7: 14.4 sec with RW, 18.5 sec with SBQC   Time  12    Period Weeks    Status MET   Target Date 05/18/22    PT LONG TERM GOAL #6  Title Pt will decrease TUG to 14 sec or below with SBQC in order to demonstrate decreased fall risk.   Baseline  9/7: 14.4 sec with RW, 18.5 sec with SBQC; 11/30=16.85 sec without an AD. 09/15/2022= 22.14 sec with SPC  Time 12   Period Weeks   Status ONGOING  Target Date 11/05/2022   PT LONG TERM GOAL #7  Title Patient will increase six minute walk test distance to >700 ft for improved gait ability and increased ease with community participation.  Baseline 9/7: completes 4 minutes and 444 ft with RW. Test discontinued at 4 minutes due to [place]. 08/11/2022= patient ambulated 200 feet yet required 1 seated rest break- due to BLE fatigue- using SBQC: 09/15/2022= Patient ambulated 300 feet in 4 min 20 sec with 1 seated rest break using   Time 12   Period Weeks   Status ONGOING  Target Date 11/03/2022   PT LONG TERM GOAL #8  Title Pt will decrease 5TSTS by at least 2 seconds (without UE support)  in order to demonstrate clinically significant improvement in LE strength.   Baseline 08/11/2022= 16.33 sec with B hands on knees; 09/15/2022= 22 sec with B hands on knees (patient reports increased weakness since around Christmas and just has not bounced back)   Time 12   Period Weeks  Status ONGOING  Target Date 11/03/2022  Plan -     Clinical Impression Statement Patient presents with excellent motivation today for session. Patient was challenged with blaze pod activities today. The patient demonstrated significant progress while utilizing [employer], showcasing improved coordination, balance, and cognitive function. The incorporation of dual-tasking technology with color recognition and association with specific movements in Blaze Pods was strategically chosen to provide a dynamic training environment, enabling the patient to engage in simultaneous physical and cognitive tasks. This unique approach enhances  not only their physical abilities but also fosters increased neural connectivity and mental awareness, contributing to a well-rounded and effective rehabilitation and training experience.   Pt will continue to benefit from skilled physical therapy intervention to address impairments, improve QOL, and attain therapy goals.    Personal Factors and Comorbidities Age;Past/Current Experience;Time since onset of injury/illness/exacerbation;Comorbidity 3+    Comorbidities COPD, History of Cancer, myasthenia gravis, chronic lumbar surgical history    Examination-Activity Limitations Lift;Squat;Bend;Stairs;Stand;Transfers;Insurance claims handler;Bathing;Hygiene/Grooming;Dressing;Toileting;Bed Mobility;Caring for Others;Continence    Examination-Participation Restrictions Yard Work;Church;Cleaning;Driving;Community Activity    Stability/Clinical Decision Making Evolving/Moderate complexity    Rehab Potential Good    Clinical Impairments Affecting Rehab Potential multipe comorbidities    PT Frequency 2x / week    PT Duration 12 weeks    PT Treatment/Interventions ADLs/Self Care Home Management;Electrical Stimulation;Moist Heat;Traction;Ultrasound;Gait training;Stair training;Functional mobility training;Therapeutic activities;Therapeutic exercise;Balance training;Neuromuscular re-education;Patient/family education;Manual techniques;Passive range of motion;Dry needling;Joint Manipulations;Cryotherapy;Vestibular;Canalith Repostioning    PT Next Visit Plan standing strengthening and balance, seated strengthening and coordination    PT Home Exercise Plan Progress LE strengthen and functional endurance, continue plan   Consulted and Agree with Plan of Care Patient            Ollen Bowl, PT Physical Therapist- [facility]  09/23/22, 3:29 PM

## 2022-09-24 ENCOUNTER — Other Ambulatory Visit: Payer: Self-pay | Admitting: Vascular Surgery

## 2022-09-27 ENCOUNTER — Ambulatory Visit: Payer: Medicare Other

## 2022-09-27 DIAGNOSIS — R2681 Unsteadiness on feet: Secondary | ICD-10-CM

## 2022-09-27 DIAGNOSIS — R278 Other lack of coordination: Secondary | ICD-10-CM

## 2022-09-27 DIAGNOSIS — M6281 Muscle weakness (generalized): Secondary | ICD-10-CM

## 2022-09-27 DIAGNOSIS — R2689 Other abnormalities of gait and mobility: Secondary | ICD-10-CM

## 2022-09-27 DIAGNOSIS — R262 Difficulty in walking, not elsewhere classified: Secondary | ICD-10-CM

## 2022-09-27 DIAGNOSIS — G8929 Other chronic pain: Secondary | ICD-10-CM

## 2022-09-27 DIAGNOSIS — R296 Repeated falls: Secondary | ICD-10-CM

## 2022-09-27 DIAGNOSIS — G959 Disease of spinal cord, unspecified: Secondary | ICD-10-CM

## 2022-09-27 NOTE — Therapy (Signed)
OUTPATIENT PHYSICAL THERAPY TREATMENT NOTE     Patient Name: Francisco [name] MRN: [id number] DOB:12-Sep-1946, 77 y.o., male Today's Date: 09/28/2022  PCP: Adin Hector, MD REFERRING PROVIDER: Meade Maw MD   PT End of Session - 09/27/22 1524     Visit Number 43    Number of Visits 88    Date for PT Re-Evaluation 11/03/22    Authorization Time Period 08/11/22-11/03/2022    Progress Note Due on Visit 1    PT Start Time 1516    PT Stop Time 1559    PT Time Calculation (min) 43 min    Equipment Utilized During Treatment Gait belt    Activity Tolerance Patient tolerated treatment well    Behavior During Therapy WFL for tasks assessed/performed                 Past Medical History:  Diagnosis Date   Arthritis    lower left hip   Atypical angina    Bilateral hand numbness    from back surgery   Bronchitis, chronic (HCC)    Cancer ([place])    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; Merkel cell cancer, and Basal cell cancer (twice; 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   Carotid stenosis    CHF (congestive heart failure) (HCC)    CKD (chronic kidney disease)    CKD (chronic kidney disease) stage 3, GFR 30-59 ml/min (HCC)    COPD (chronic obstructive pulmonary disease) ([place])    stage 2   DDD (degenerative disc disease), cervical    Dysrhythmia    post carotid stent bradycardia; PAF 09/2020   GERD (gastroesophageal reflux disease)    Hypercholesterolemia    Hypertension    Hypothyroidism    pt takes Levothyroxine daily   Lumbosacral spinal stenosis    Myasthenia gravis, adult form ([place])    PAD (peripheral artery disease) ([place])    Shortness of breath    Lung MD- Dr Darlin Coco   Sleep apnea    do not use CPAP every night   Past Surgical History:  Procedure Laterality Date   ANTERIOR CERVICAL DECOMP/DISCECTOMY FUSION  07/18/2011   Procedure: ANTERIOR CERVICAL DECOMPRESSION/DISCECTOMY FUSION 2 LEVELS;  Surgeon: Cooper Render Pool;  Location: [facility];  Service: Neurosurgery;   Laterality: N/A;  cervical five-six, cervical six-seven anterior cervical discectomy and fusion   BACK SURGERY     in [place]     01/2020 Right, 04/2020 Left   CARDIAC CATHETERIZATION     2005 at [facility], no stents   CAROTID PTA/STENT INTERVENTION N/A 09/17/2020   Procedure: CAROTID PTA/STENT INTERVENTION;  Surgeon: Algernon Huxley, MD;  Location: [facility];  Service: Cardiovascular;  Laterality: N/A;   CATARACT EXTRACTION W/PHACO Left 01/06/2020   Procedure: CATARACT EXTRACTION PHACO AND INTRAOCULAR LENS PLACEMENT (IOC) ISTENT INJ LEFT 3.81  00:33.3;  Surgeon: Eulogio Bear, MD;  Location: [facility];  Service: Ophthalmology;  Laterality: Left;   CATARACT EXTRACTION W/PHACO Right 02/03/2020   Procedure: CATARACT EXTRACTION PHACO AND INTRAOCULAR LENS PLACEMENT ([place]) RIGHT ISTENT INJ;  Surgeon: Eulogio Bear, MD;  Location: [facility];  Service: Ophthalmology;  Laterality: Right;  4.29 0:35.6   COLONOSCOPY     HERNIA REPAIR Left    inguinal hernia repair in [place] MICRODISCECTOMY Left 02/24/2014   Procedure: LUMBAR LAMINECTOMY/DECOMPRESSION MICRODISCECTOMY LUMBAR THREE-FOUR, FOUR-FIVE, LEFT FIVE-SACRAL ONE ;  Surgeon: Charlie Pitter, MD;  Location: [facility];  Service: Neurosurgery;  Laterality: Left;  LUMBAR LAMINECTOMY/DECOMPRESSION MICRODISCECTOMY LUMBAR THREE-FOUR, FOUR-FIVE, LEFT FIVE-SACRAL ONE    LUMBAR LAMINECTOMY/DECOMPRESSION MICRODISCECTOMY N/A 05/03/2021   Procedure: Laminectomy and Foraminotomy - L2-L3;  Surgeon: Earnie Larsson, MD;  Location: [facility];  Service: Neurosurgery;  Laterality: N/A;  3C   POSTERIOR CERVICAL FUSION/FORAMINOTOMY N/A 08/07/2020   Procedure: C3-6 POSTERIOR FUSION WITH DECOMPRESSION;  Surgeon: Meade Maw, MD;  Location: [facility];  Service: Neurosurgery;  Laterality: N/A;   PROSTATECTOMY  8/14   [facility] Dr Mare Ferrari    Patient Active Problem List    Diagnosis Date Noted   SVT (supraventricular tachycardia) 12/30/2021   Hypothyroidism 12/30/2021   Parkinson's disease 12/30/2021   Elevated troponin 12/30/2021   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   Acquired thrombophilia ([place]) 01/13/2021   History of decompression of median nerve 01/01/2021   Paroxysmal atrial fibrillation ([place]) 10/06/2020   Carotid stenosis, right 09/17/2020   S/P cervical spinal fusion    Leukocytosis    Essential hypertension    Anemia of chronic disease    Postoperative pain    Neuropathic pain    Cervical myelopathy ([place]) 08/07/2020   Preop cardiovascular exam 07/17/2020   SOB (shortness of breath) on exertion 07/17/2020   Leg weakness, bilateral 07/13/2020   Aortic atherosclerosis ([place]) 07/09/2020   Body mass index (BMI) 34.0-34.9, adult 12/25/2019   Myalgia 10/30/2019   Lumbar post-laminectomy syndrome 10/24/2019   PAD (peripheral artery disease) ([place]) 06/06/2019   CKD (chronic kidney disease) stage 3, GFR 30-59 ml/min (HCC) 04/29/2019   B12 deficiency 01/23/2019   Left arm numbness 02/28/2018   Neck pain 02/28/2018   Anemia 02/02/2017   DDD (degenerative disc disease), cervical 02/02/2017   Hypothyroid 02/02/2017   MRSA (methicillin resistant staph aureus) culture positive 02/02/2017   Nocturnal hypoxia 02/02/2017   Senile purpura ([place]) 10/03/2016   Essential hypertension, benign 09/16/2016   Bilateral carotid artery disease ([place]) 09/16/2016   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/18/2016   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   History of prostate cancer 12/21/2015   Left carpal tunnel syndrome 11/11/2015   Kidney stone on left side 07/05/2015   Myasthenia gravis ([place]) 05/26/2015   Radiculitis 01/05/2015   Long-term use of high-risk medication 10/15/2014   Persistent cough 09/10/2014   Pure hypercholesterolemia 07/25/2014   Spinal stenosis, lumbar region, with neurogenic claudication 02/24/2014   Lumbosacral stenosis  with neurogenic claudication 02/24/2014   COPD (chronic obstructive pulmonary disease) ([place]) 01/22/2014    REFERRING DIAG: balance disorder   THERAPY DIAG:  Muscle weakness (generalized)  Difficulty in walking, not elsewhere classified  Unsteadiness on feet  Other abnormalities of gait and mobility  Other lack of coordination  Chronic bilateral low back pain, unspecified whether sciatica present  Repeated falls  Cervical myelopathy (HCC)  Rationale for Evaluation and Treatment Rehabilitation  PERTINENT HISTORY: Kenyen Candy is a 77 year old male referred to [facility] neuro for difficulty with balance. He was also just recently dx in May 2023 with Impingement syndrome of left shoulder (M75.42) Impingement syndrome of left shoulder (primary encounter diagnosis) Left shoulder pain, unspecified chronicity Nontraumatic rupture of left proximal biceps tendon Past medical hx includes Lumbar laminectomy 04/2021. C3-6 decompression, fusion on 11/26, myasthenia gravis, 2012 ACDF 5/6, 6/7; OSA, hypothyroidism, HTN, COPD, multiple back surgeries, 2021 carpal tunnel release.  PRECAUTIONS: Fall  SUBJECTIVE:  Patient reports having a really good week last week but not so much so far this week.   PAIN:  Are  you having pain? None reported  TODAY'S TREATMENT: 09/27/2022  There.ex:   Nustep- L1 with LE only x 5 min for LE muscle strengthening.  Calf raises 2 sets x 12 reps Toe raises from 1/2 foam 2 sets x 12 reps each Side step up/over 1/2 foam then then up/over 6" block with min UE support  x 12 reps each LE.  Chest press/scap retract with pvc pipe 2 sets x 10 reps Sit to stand with overhead press using PVC 2 sets x 10 reps Shoulder overhead ER with pvc pipe 2 sets x 10 reps Ambulation in clinic, hospital hallway from gym to elevator approx 200 feet holding onto cane but choosing to walk without one.              PATIENT EDUCATION: Education details: Goals, plan, exercise  technique, body mechanics, energy conservation techniques  Person educated: Patient Education method: Explanation, Demonstration, Tactile cues, Verbal cues, and Handouts Education comprehension: verbalized understanding, returned demonstration, verbal cues required, and tactile cues required   HOME EXERCISE PROGRAM:  No Updates today  Access Code: [email or web address] URL: [email or web address] Date: 03/01/2022 Prepared by: Janna Arch  Exercises - Seated March  - 1 x daily - 7 x weekly - 2 sets - 10 reps - 5 hold - Seated Long Arc Quad  - 1 x daily - 7 x weekly - 2 sets - 10 reps - 5 hold - Seated Hip Abduction  - 1 x daily - 7 x weekly - 2 sets - 10 reps - 5 hold - Seated Hip Adduction Isometrics with Ball  - 1 x daily - 7 x weekly - 2 sets - 10 reps - 5 hold - Seated Gluteal Sets  - 1 x daily - 7 x weekly - 2 sets - 10 reps - 5 hold - Seated Heel Toe Raises  - 1 x daily - 7 x weekly - 2 sets - 10 reps - 5 hold LE strength and balance         PT Short Term Goals       PT SHORT TERM GOAL #1   Title Pt will be independent with initial HEP in order to improve strength and balance in order to decrease fall risk and improve function at home and work.    Baseline 02/23/2022- Patient reports not doing much as far as exercise in the home currently. 04/20/2022- Patient reports compliant with HEP and no questions at this time.; 9/7 pt indep   Time 6    Period Weeks    Status Goal met   Target Date 04/06/22              PT Long Term Goals       PT LONG TERM GOAL #1   Title Pt will be independent with final HEP in order to improve strength and balance in order to decrease fall risk and improve function at home and work. 04/20/2022- Patient reports compliant with HEP and no questions at this time.    Baseline 02/23/2022= No formal HEP in place. 9/7: pt performing at least once a day, feels comfortable   Time 12    Period Weeks    Status GOAL MET   Target Date 08/11/2022       PT LONG TERM GOAL #2   Title Pt will decrease 5TSTS by at least 8 seconds in order to demonstrate clinically significant improvement in LE strength.    Baseline 02/23/2022= 31.52 sec with B hands on knees; 04/20/2022=  16.08 sec with B hands on knees   Time 12    Period Days    Status Goal Met   Target Date 05/18/22      PT LONG TERM GOAL #3   Title Pt will increase 10MWT by at least 0.23 m/s in order to demonstrate clinically significant improvement in community ambulation.    Baseline 02/23/2022= 0.39 m/s using RW; 0.45 m/s; 9/7: 0.37 m/s with QC, 0.74 m/s with RW   Time 12    Period Weeks    Status GOAL MET   Target Date 05/18/22      PT LONG TERM GOAL #4   Title Pt will improve FOTO to target score of 50 to display perceived improvements in ability to complete ADL's.    Baseline 02/23/2022= 47; 9/7: 49; 11/30=54%   Time 12    Period Weeks    Status GOAL MET   Target Date 08/11/2022      PT LONG TERM GOAL #5   Title Pt will decrease TUG to below 20 seconds/decrease in order to demonstrate decreased fall risk.    Baseline 02/23/2022=28.27 sec with RW; 04/20/2022= 20.22 with SBQC; 9/7: 14.4 sec with RW, 18.5 sec with SBQC   Time 12    Period Weeks    Status MET   Target Date 05/18/22    PT LONG TERM GOAL #6  Title Pt will decrease TUG to 14 sec or below with SBQC in order to demonstrate decreased fall risk.   Baseline  9/7: 14.4 sec with RW, 18.5 sec with SBQC; 11/30=16.85 sec without an AD. 09/15/2022= 22.14 sec with SPC  Time 12   Period Weeks   Status ONGOING  Target Date 11/05/2022   PT LONG TERM GOAL #7  Title Patient will increase six minute walk test distance to >700 ft for improved gait ability and increased ease with community participation.  Baseline 9/7: completes 4 minutes and 444 ft with RW. Test discontinued at 4 minutes due to [place]. 08/11/2022= patient ambulated 200 feet yet required 1 seated rest break- due to BLE fatigue- using SBQC: 09/15/2022= Patient ambulated 300  feet in 4 min 20 sec with 1 seated rest break using   Time 12   Period Weeks   Status ONGOING  Target Date 11/03/2022   PT LONG TERM GOAL #8  Title Pt will decrease 5TSTS by at least 2 seconds (without UE support)  in order to demonstrate clinically significant improvement in LE strength.   Baseline 08/11/2022= 16.33 sec with B hands on knees; 09/15/2022= 22 sec with B hands on knees (patient reports increased weakness since around Christmas and just has not bounced back)   Time 12   Period Weeks  Status ONGOING  Target Date 11/03/2022            Plan -     Clinical Impression Statement  Patient continues to respond to treatment and able to perform more standing activities without report of pain- only fatigue as limiting factor. He was also able to walk further today vs. Past few visits.  Pt will continue to benefit from skilled physical therapy intervention to address impairments, improve QOL, and attain therapy goals.    Personal Factors and Comorbidities Age;Past/Current Experience;Time since onset of injury/illness/exacerbation;Comorbidity 3+    Comorbidities COPD, History of Cancer, myasthenia gravis, chronic lumbar surgical history    Examination-Activity Limitations Lift;Squat;Bend;Stairs;Stand;Transfers;Insurance claims handler;Bathing;Hygiene/Grooming;Dressing;Toileting;Bed Mobility;Caring for Others;Continence    Examination-Participation Restrictions Yard Work;Church;Cleaning;Driving;Community Activity    Stability/Clinical Decision Making Evolving/Moderate  complexity    Rehab Potential Good    Clinical Impairments Affecting Rehab Potential multipe comorbidities    PT Frequency 2x / week    PT Duration 12 weeks    PT Treatment/Interventions ADLs/Self Care Home Management;Electrical Stimulation;Moist Heat;Traction;Ultrasound;Gait training;Stair training;Functional mobility training;Therapeutic activities;Therapeutic exercise;Balance training;Neuromuscular  re-education;Patient/family education;Manual techniques;Passive range of motion;Dry needling;Joint Manipulations;Cryotherapy;Vestibular;Canalith Repostioning    PT Next Visit Plan standing strengthening and balance, seated strengthening and coordination    PT Home Exercise Plan Progress LE strengthen and functional endurance, continue plan   Consulted and Agree with Plan of Care Patient            Ollen Bowl, PT Physical Therapist- [facility]  09/28/22, 1:00 PM

## 2022-09-28 NOTE — Telephone Encounter (Signed)
Refills need to go to PCP to f/u with labs

## 2022-09-29 ENCOUNTER — Ambulatory Visit: Payer: Medicare Other

## 2022-09-29 DIAGNOSIS — G959 Disease of spinal cord, unspecified: Secondary | ICD-10-CM

## 2022-09-29 DIAGNOSIS — R262 Difficulty in walking, not elsewhere classified: Secondary | ICD-10-CM

## 2022-09-29 DIAGNOSIS — R2689 Other abnormalities of gait and mobility: Secondary | ICD-10-CM

## 2022-09-29 DIAGNOSIS — R278 Other lack of coordination: Secondary | ICD-10-CM

## 2022-09-29 DIAGNOSIS — M6281 Muscle weakness (generalized): Secondary | ICD-10-CM | POA: Diagnosis not present

## 2022-09-29 DIAGNOSIS — R296 Repeated falls: Secondary | ICD-10-CM

## 2022-09-29 DIAGNOSIS — M545 Low back pain, unspecified: Secondary | ICD-10-CM

## 2022-09-29 DIAGNOSIS — R2681 Unsteadiness on feet: Secondary | ICD-10-CM

## 2022-09-29 NOTE — Therapy (Signed)
OUTPATIENT PHYSICAL THERAPY TREATMENT NOTE     Patient Name: Francisco [name] MRN: [id number] DOB:10/22/1945, 77 y.o., male Today's Date: 09/29/2022  PCP: Adin Hector, MD REFERRING PROVIDER: Meade Maw MD   PT End of Session - 09/29/22 1535     Visit Number 44    Number of Visits 45    Date for PT Re-Evaluation 11/03/22    Authorization Time Period 08/11/22-11/03/2022    Progress Note Due on Visit 59    PT Start Time 1515    PT Stop Time 1555    PT Time Calculation (min) 40 min    Equipment Utilized During Treatment Gait belt    Activity Tolerance Patient tolerated treatment well    Behavior During Therapy WFL for tasks assessed/performed                  Past Medical History:  Diagnosis Date   Arthritis    lower left hip   Atypical angina    Bilateral hand numbness    from back surgery   Bronchitis, chronic (HCC)    Cancer ([place])    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; Merkel cell cancer, and Basal cell cancer (twice; 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   Carotid stenosis    CHF (congestive heart failure) (HCC)    CKD (chronic kidney disease)    CKD (chronic kidney disease) stage 3, GFR 30-59 ml/min (HCC)    COPD (chronic obstructive pulmonary disease) ([place])    stage 2   DDD (degenerative disc disease), cervical    Dysrhythmia    post carotid stent bradycardia; PAF 09/2020   GERD (gastroesophageal reflux disease)    Hypercholesterolemia    Hypertension    Hypothyroidism    pt takes Levothyroxine daily   Lumbosacral spinal stenosis    Myasthenia gravis, adult form ([place])    PAD (peripheral artery disease) ([place])    Shortness of breath    Lung MD- Dr Darlin Coco   Sleep apnea    do not use CPAP every night   Past Surgical History:  Procedure Laterality Date   ANTERIOR CERVICAL DECOMP/DISCECTOMY FUSION  07/18/2011   Procedure: ANTERIOR CERVICAL DECOMPRESSION/DISCECTOMY FUSION 2 LEVELS;  Surgeon: Cooper Render Pool;  Location: [facility];  Service: Neurosurgery;   Laterality: N/A;  cervical five-six, cervical six-seven anterior cervical discectomy and fusion   BACK SURGERY     in [place]     01/2020 Right, 04/2020 Left   CARDIAC CATHETERIZATION     2005 at [facility], no stents   CAROTID PTA/STENT INTERVENTION N/A 09/17/2020   Procedure: CAROTID PTA/STENT INTERVENTION;  Surgeon: Algernon Huxley, MD;  Location: [facility];  Service: Cardiovascular;  Laterality: N/A;   CATARACT EXTRACTION W/PHACO Left 01/06/2020   Procedure: CATARACT EXTRACTION PHACO AND INTRAOCULAR LENS PLACEMENT (IOC) ISTENT INJ LEFT 3.81  00:33.3;  Surgeon: Eulogio Bear, MD;  Location: [facility];  Service: Ophthalmology;  Laterality: Left;   CATARACT EXTRACTION W/PHACO Right 02/03/2020   Procedure: CATARACT EXTRACTION PHACO AND INTRAOCULAR LENS PLACEMENT ([place]) RIGHT ISTENT INJ;  Surgeon: Eulogio Bear, MD;  Location: [facility];  Service: Ophthalmology;  Laterality: Right;  4.29 0:35.6   COLONOSCOPY     HERNIA REPAIR Left    inguinal hernia repair in 1985   LUMBAR LAMINECTOMY/DECOMPRESSION MICRODISCECTOMY Left 02/24/2014   Procedure: LUMBAR LAMINECTOMY/DECOMPRESSION MICRODISCECTOMY LUMBAR THREE-FOUR, FOUR-FIVE, LEFT FIVE-SACRAL ONE ;  Surgeon: Charlie Pitter, MD;  Location: [facility]  NEURO ORS;  Service: Neurosurgery;  Laterality: Left;  LUMBAR LAMINECTOMY/DECOMPRESSION MICRODISCECTOMY LUMBAR THREE-FOUR, FOUR-FIVE, LEFT FIVE-SACRAL ONE    LUMBAR LAMINECTOMY/DECOMPRESSION MICRODISCECTOMY N/A 05/03/2021   Procedure: Laminectomy and Foraminotomy - L2-L3;  Surgeon: Earnie Larsson, MD;  Location: [facility];  Service: Neurosurgery;  Laterality: N/A;  3C   POSTERIOR CERVICAL FUSION/FORAMINOTOMY N/A 08/07/2020   Procedure: C3-6 POSTERIOR FUSION WITH DECOMPRESSION;  Surgeon: Meade Maw, MD;  Location: [facility] ORS;  Service: Neurosurgery;  Laterality: N/A;   PROSTATECTOMY  8/14   [facility] Dr Mare Ferrari    Patient Active Problem List    Diagnosis Date Noted   SVT (supraventricular tachycardia) 12/30/2021   Hypothyroidism 12/30/2021   Parkinson's disease 12/30/2021   Elevated troponin 12/30/2021   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   Acquired thrombophilia ([place]) 01/13/2021   History of decompression of median nerve 01/01/2021   Paroxysmal atrial fibrillation ([place]) 10/06/2020   Carotid stenosis, right 09/17/2020   S/P cervical spinal fusion    Leukocytosis    Essential hypertension    Anemia of chronic disease    Postoperative pain    Neuropathic pain    Cervical myelopathy ([place]) 08/07/2020   Preop cardiovascular exam 07/17/2020   SOB (shortness of breath) on exertion 07/17/2020   Leg weakness, bilateral 07/13/2020   Aortic atherosclerosis ([place]) 07/09/2020   Body mass index (BMI) 34.0-34.9, adult 12/25/2019   Myalgia 10/30/2019   Lumbar post-laminectomy syndrome 10/24/2019   PAD (peripheral artery disease) ([place]) 06/06/2019   CKD (chronic kidney disease) stage 3, GFR 30-59 ml/min (HCC) 04/29/2019   B12 deficiency 01/23/2019   Left arm numbness 02/28/2018   Neck pain 02/28/2018   Anemia 02/02/2017   DDD (degenerative disc disease), cervical 02/02/2017   Hypothyroid 02/02/2017   MRSA (methicillin resistant staph aureus) culture positive 02/02/2017   Nocturnal hypoxia 02/02/2017   Senile purpura ([place]) 10/03/2016   Essential hypertension, benign 09/16/2016   Bilateral carotid artery disease ([place]) 09/16/2016   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/18/2016   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   History of prostate cancer 12/21/2015   Left carpal tunnel syndrome 11/11/2015   Kidney stone on left side 07/05/2015   Myasthenia gravis ([place]) 05/26/2015   Radiculitis 01/05/2015   Long-term use of high-risk medication 10/15/2014   Persistent cough 09/10/2014   Pure hypercholesterolemia 07/25/2014   Spinal stenosis, lumbar region, with neurogenic claudication 02/24/2014   Lumbosacral stenosis  with neurogenic claudication 02/24/2014   COPD (chronic obstructive pulmonary disease) ([place]) 01/22/2014    REFERRING DIAG: balance disorder   THERAPY DIAG:  Muscle weakness (generalized)  Difficulty in walking, not elsewhere classified  Unsteadiness on feet  Other abnormalities of gait and mobility  Other lack of coordination  Chronic bilateral low back pain, unspecified whether sciatica present  Repeated falls  Cervical myelopathy (HCC)  Rationale for Evaluation and Treatment Rehabilitation  PERTINENT HISTORY: Artice Bergerson is a 77 year old male referred to [facility] neuro for difficulty with balance. He was also just recently dx in May 2023 with Impingement syndrome of left shoulder (M75.42) Impingement syndrome of left shoulder (primary encounter diagnosis) Left shoulder pain, unspecified chronicity Nontraumatic rupture of left proximal biceps tendon Past medical hx includes Lumbar laminectomy 04/2021. C3-6 decompression, fusion on 11/26, myasthenia gravis, 2012 ACDF 5/6, 6/7; OSA, hypothyroidism, HTN, COPD, multiple back surgeries, 2021 carpal tunnel release.  PRECAUTIONS: Fall  SUBJECTIVE:  Patient reports continuing to feel weak this week but states "I will do what I can."  PAIN:  Are you  having pain? None reported  TODAY'S TREATMENT: 09/29/2022  There.ex:   Scap retract Matrix cable system 7.5 # - 2 sets x 12 reps  Shoulder ext 2.5 lb - 2 sets x 12 reps Horizontal shoulder abd with 2.5 # x 2 sets x 12 reps Chest press/pull - pvc 2 sets of 10 reps Shoulder ER with PVC (overhead) 2 set of 10 reps Sit to stand x 10 reps  Ambulation in clinic, hospital hallway from gym to elevator approx 100 feet - difficulty with advancing left LE today  and unstable - encouraged to use [facility] for safety.              PATIENT EDUCATION: Education details: Goals, plan, exercise technique, body mechanics, energy conservation techniques  Person educated: Patient Education method:  Explanation, Demonstration, Tactile cues, Verbal cues, and Handouts Education comprehension: verbalized understanding, returned demonstration, verbal cues required, and tactile cues required   HOME EXERCISE PROGRAM:  No Updates today  Access Code: [email or web address] URL: [email or web address] Date: 03/01/2022 Prepared by: Janna Arch  Exercises - Seated March  - 1 x daily - 7 x weekly - 2 sets - 10 reps - 5 hold - Seated Long Arc Quad  - 1 x daily - 7 x weekly - 2 sets - 10 reps - 5 hold - Seated Hip Abduction  - 1 x daily - 7 x weekly - 2 sets - 10 reps - 5 hold - Seated Hip Adduction Isometrics with Ball  - 1 x daily - 7 x weekly - 2 sets - 10 reps - 5 hold - Seated Gluteal Sets  - 1 x daily - 7 x weekly - 2 sets - 10 reps - 5 hold - Seated Heel Toe Raises  - 1 x daily - 7 x weekly - 2 sets - 10 reps - 5 hold LE strength and balance         PT Short Term Goals       PT SHORT TERM GOAL #1   Title Pt will be independent with initial HEP in order to improve strength and balance in order to decrease fall risk and improve function at home and work.    Baseline 02/23/2022- Patient reports not doing much as far as exercise in the home currently. 04/20/2022- Patient reports compliant with HEP and no questions at this time.; 9/7 pt indep   Time 6    Period Weeks    Status Goal met   Target Date 04/06/22              PT Long Term Goals       PT LONG TERM GOAL #1   Title Pt will be independent with final HEP in order to improve strength and balance in order to decrease fall risk and improve function at home and work. 04/20/2022- Patient reports compliant with HEP and no questions at this time.    Baseline 02/23/2022= No formal HEP in place. 9/7: pt performing at least once a day, feels comfortable   Time 12    Period Weeks    Status GOAL MET   Target Date 08/11/2022      PT LONG TERM GOAL #2   Title Pt will decrease 5TSTS by at least 8 seconds in order to demonstrate  clinically significant improvement in LE strength.    Baseline 02/23/2022= 31.52 sec with B hands on knees; 04/20/2022= 16.08 sec with B hands on knees   Time 12    Period Days  Status Goal Met   Target Date 05/18/22      PT LONG TERM GOAL #3   Title Pt will increase 10MWT by at least 0.23 m/s in order to demonstrate clinically significant improvement in community ambulation.    Baseline 02/23/2022= 0.39 m/s using RW; 0.45 m/s; 9/7: 0.37 m/s with QC, 0.74 m/s with RW   Time 12    Period Weeks    Status GOAL MET   Target Date 05/18/22      PT LONG TERM GOAL #4   Title Pt will improve FOTO to target score of 50 to display perceived improvements in ability to complete ADL's.    Baseline 02/23/2022= 47; 9/7: 49; 11/30=54%   Time 12    Period Weeks    Status GOAL MET   Target Date 08/11/2022      PT LONG TERM GOAL #5   Title Pt will decrease TUG to below 20 seconds/decrease in order to demonstrate decreased fall risk.    Baseline 02/23/2022=28.27 sec with RW; 04/20/2022= 20.22 with SBQC; 9/7: 14.4 sec with RW, 18.5 sec with SBQC   Time 12    Period Weeks    Status MET   Target Date 05/18/22    PT LONG TERM GOAL #6  Title Pt will decrease TUG to 14 sec or below with SBQC in order to demonstrate decreased fall risk.   Baseline  9/7: 14.4 sec with RW, 18.5 sec with SBQC; 11/30=16.85 sec without an AD. 09/15/2022= 22.14 sec with SPC  Time 12   Period Weeks   Status ONGOING  Target Date 11/05/2022   PT LONG TERM GOAL #7  Title Patient will increase six minute walk test distance to >700 ft for improved gait ability and increased ease with community participation.  Baseline 9/7: completes 4 minutes and 444 ft with RW. Test discontinued at 4 minutes due to [place]. 08/11/2022= patient ambulated 200 feet yet required 1 seated rest break- due to BLE fatigue- using SBQC: 09/15/2022= Patient ambulated 300 feet in 4 min 20 sec with 1 seated rest break using   Time 12   Period Weeks   Status ONGOING   Target Date 11/03/2022   PT LONG TERM GOAL #8  Title Pt will decrease 5TSTS by at least 2 seconds (without UE support)  in order to demonstrate clinically significant improvement in LE strength.   Baseline 08/11/2022= 16.33 sec with B hands on knees; 09/15/2022= 22 sec with B hands on knees (patient reports increased weakness since around Christmas and just has not bounced back)   Time 12   Period Weeks  Status ONGOING  Target Date 11/03/2022            Plan -     Clinical Impression Statement Patient presents with good motivation. Still has some left sided weakness which appeared more prominent today. Treatment focused on postural training with patient presenting with some left sided weakness yet able to complete reps. He continues to strive to do his very best and frustrated at end of session- stopping due to excessive fatigue with walking.  Pt will continue to benefit from skilled physical therapy intervention to address impairments, improve QOL, and attain therapy goals.    Personal Factors and Comorbidities Age;Past/Current Experience;Time since onset of injury/illness/exacerbation;Comorbidity 3+    Comorbidities COPD, History of Cancer, myasthenia gravis, chronic lumbar surgical history    Examination-Activity Limitations Lift;Squat;Bend;Stairs;Stand;Transfers;Insurance claims handler;Bathing;Hygiene/Grooming;Dressing;Toileting;Bed Mobility;Caring for Others;Continence    Examination-Participation Restrictions Yard Work;Church;Cleaning;Driving;Community Activity    Stability/Clinical Decision  Making Evolving/Moderate complexity    Rehab Potential Good    Clinical Impairments Affecting Rehab Potential multipe comorbidities    PT Frequency 2x / week    PT Duration 12 weeks    PT Treatment/Interventions ADLs/Self Care Home Management;Electrical Stimulation;Moist Heat;Traction;Ultrasound;Gait training;Stair training;Functional mobility training;Therapeutic  activities;Therapeutic exercise;Balance training;Neuromuscular re-education;Patient/family education;Manual techniques;Passive range of motion;Dry needling;Joint Manipulations;Cryotherapy;Vestibular;Canalith Repostioning    PT Next Visit Plan standing strengthening and balance, seated strengthening and coordination    PT Home Exercise Plan Progress LE strengthen and functional endurance, continue plan   Consulted and Agree with Plan of Care Patient            Ollen Bowl, PT Physical Therapist- [facility]  09/29/22, 5:08 PM

## 2022-09-30 ENCOUNTER — Other Ambulatory Visit: Payer: Self-pay | Admitting: Vascular Surgery

## 2022-10-01 ENCOUNTER — Other Ambulatory Visit: Payer: Self-pay | Admitting: Vascular Surgery

## 2022-10-04 ENCOUNTER — Ambulatory Visit: Payer: Medicare Other | Admitting: Physical Therapy

## 2022-10-04 DIAGNOSIS — M6281 Muscle weakness (generalized): Secondary | ICD-10-CM | POA: Diagnosis not present

## 2022-10-04 DIAGNOSIS — R262 Difficulty in walking, not elsewhere classified: Secondary | ICD-10-CM

## 2022-10-04 DIAGNOSIS — R2689 Other abnormalities of gait and mobility: Secondary | ICD-10-CM

## 2022-10-04 DIAGNOSIS — R2681 Unsteadiness on feet: Secondary | ICD-10-CM

## 2022-10-04 NOTE — Therapy (Signed)
OUTPATIENT PHYSICAL THERAPY TREATMENT NOTE     Patient Name: Francisco [name] MRN: [id number] DOB:[birth year], 77 y.o., male Today's Date: 10/04/2022  PCP: Adin Hector, MD REFERRING PROVIDER: Meade Maw MD   PT End of Session - 10/04/22 1517     Visit Number 45    Number of Visits 47    Date for PT Re-Evaluation 11/03/22    Authorization Time Period 08/11/22-11/03/2022    Progress Note Due on Visit 34    Equipment Utilized During Treatment Gait belt    Activity Tolerance Patient tolerated treatment well    Behavior During Therapy [facility] for tasks assessed/performed                   Past Medical History:  Diagnosis Date   Arthritis    lower left hip   Atypical angina    Bilateral hand numbness    from back surgery   Bronchitis, chronic (HCC)    Cancer ([place])    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; Merkel cell cancer, and Basal cell cancer (twice; 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)    stage 2   DDD (degenerative disc disease), cervical    Dysrhythmia    post carotid stent bradycardia; PAF 09/2020   GERD (gastroesophageal reflux disease)    Hypercholesterolemia    Hypertension    Hypothyroidism    pt takes Levothyroxine daily   Lumbosacral spinal stenosis    Myasthenia gravis, adult form ([place])    PAD (peripheral artery disease) ([place])    Shortness of breath    Lung MD- Dr Darlin Coco   Sleep apnea    do not use CPAP every night   Past Surgical History:  Procedure Laterality Date   ANTERIOR CERVICAL DECOMP/DISCECTOMY FUSION  07/18/2011   Procedure: ANTERIOR CERVICAL DECOMPRESSION/DISCECTOMY FUSION 2 LEVELS;  Surgeon: Cooper Render Pool;  Location: [place] [facility];  Service: Neurosurgery;  Laterality: N/A;  cervical five-six, cervical six-seven anterior cervical discectomy  and fusion   BACK SURGERY     in [place]     01/2020 Right, 04/2020 Left   CARDIAC CATHETERIZATION     2005 at [facility], no stents   CAROTID PTA/STENT INTERVENTION N/A 09/17/2020   Procedure: CAROTID PTA/STENT INTERVENTION;  Surgeon: Algernon Huxley, MD;  Location: [place] CV LAB;  Service: Cardiovascular;  Laterality: N/A;   CATARACT EXTRACTION W/PHACO Left 01/06/2020   Procedure: CATARACT EXTRACTION PHACO AND INTRAOCULAR LENS PLACEMENT (IOC) ISTENT INJ LEFT 3.81  00:33.3;  Surgeon: Eulogio Bear, MD;  Location: [place];  Service: Ophthalmology;  Laterality: Left;   CATARACT EXTRACTION W/PHACO Right 02/03/2020   Procedure: CATARACT EXTRACTION PHACO AND INTRAOCULAR LENS PLACEMENT ([place]) RIGHT ISTENT INJ;  Surgeon: Eulogio Bear, MD;  Location: [place];  Service: Ophthalmology;  Laterality: Right;  4.29 0:35.6   COLONOSCOPY     HERNIA REPAIR Left    inguinal hernia repair in 1985   LUMBAR LAMINECTOMY/DECOMPRESSION MICRODISCECTOMY Left 02/24/2014   Procedure: LUMBAR LAMINECTOMY/DECOMPRESSION MICRODISCECTOMY LUMBAR THREE-FOUR, FOUR-FIVE, LEFT FIVE-SACRAL ONE ;  Surgeon: Charlie Pitter, MD;  Location: [place] [facility];  Service: Neurosurgery;  Laterality: Left;  LUMBAR LAMINECTOMY/DECOMPRESSION MICRODISCECTOMY LUMBAR THREE-FOUR, FOUR-FIVE, LEFT FIVE-SACRAL ONE    LUMBAR  LAMINECTOMY/DECOMPRESSION MICRODISCECTOMY N/A 05/03/2021   Procedure: Laminectomy and Foraminotomy - L2-L3;  Surgeon: Earnie Larsson, MD;  Location: [place];  Service: Neurosurgery;  Laterality: N/A;  3C   POSTERIOR CERVICAL FUSION/FORAMINOTOMY N/A 08/07/2020   Procedure: C3-6 POSTERIOR FUSION WITH DECOMPRESSION;  Surgeon: Meade Maw, MD;  Location: [facility] ORS;  Service: Neurosurgery;  Laterality: N/A;   PROSTATECTOMY  8/14   [facility] Dr Mare Ferrari    Patient Active Problem List   Diagnosis Date Noted   SVT (supraventricular tachycardia) 12/30/2021   Hypothyroidism  12/30/2021   Parkinson's disease 12/30/2021   Elevated troponin 12/30/2021   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   Acquired thrombophilia ([place]) 01/13/2021   History of decompression of median nerve 01/01/2021   Paroxysmal atrial fibrillation ([place]) 10/06/2020   Carotid stenosis, right 09/17/2020   S/P cervical spinal fusion    Leukocytosis    Essential hypertension    Anemia of chronic disease    Postoperative pain    Neuropathic pain    Cervical myelopathy ([place]) 08/07/2020   Preop cardiovascular exam 07/17/2020   SOB (shortness of breath) on exertion 07/17/2020   Leg weakness, bilateral 07/13/2020   Aortic atherosclerosis ([place]) 07/09/2020   Body mass index (BMI) 34.0-34.9, adult 12/25/2019   Myalgia 10/30/2019   Lumbar post-laminectomy syndrome 10/24/2019   PAD (peripheral artery disease) ([place]) 06/06/2019   CKD (chronic kidney disease) stage 3, GFR 30-59 ml/min (HCC) 04/29/2019   B12 deficiency 01/23/2019   Left arm numbness 02/28/2018   Neck pain 02/28/2018   Anemia 02/02/2017   DDD (degenerative disc disease), cervical 02/02/2017   Hypothyroid 02/02/2017   MRSA (methicillin resistant staph aureus) culture positive 02/02/2017   Nocturnal hypoxia 02/02/2017   Senile purpura ([place]) 10/03/2016   Essential hypertension, benign 09/16/2016   Bilateral carotid artery disease ([place]) 09/16/2016   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/18/2016   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  Rationale for Evaluation and Treatment Rehabilitation  PERTINENT HISTORY: Dougles Kimmey is a 77 year old male referred to [facility] neuro for difficulty with balance. He was also just recently dx in May 2023 with Impingement syndrome of left shoulder (M75.42) Impingement syndrome of left shoulder (primary encounter diagnosis) Left shoulder pain, unspecified chronicity Nontraumatic rupture of left proximal biceps tendon Past medical hx includes Lumbar laminectomy 04/2021. C3-6 decompression, fusion on 11/26, myasthenia gravis, 2012 ACDF 5/6, 6/7; OSA, hypothyroidism, HTN, COPD, multiple back surgeries, 2021 carpal tunnel release.  PRECAUTIONS: Fall  SUBJECTIVE:  Pt reports today he is feeling a little weak but felt stronger when he first got up today. Pt also reports his left leg continues to be weaker than his right leg.   PAIN:  Are you having pain? None reported  TODAY'S TREATMENT: 10/04/22   There.ex:    Nustep- L1 with LE only x .25 miles for LE muscle strengthening. 6:33 sec   Calf raises 2 sets x 12 reps  Toe raises from 1/2 foam 2 sets  x 15 reps each performed in seated position round 2 resulting in improved muscle activation   Side step up/over 4* 1/2 foam rollers in a row with UE assist, improved foot clearance with each set, cues for large step over motion   Chest press/scap retract with pvc pipe 2 sets x 10 reps in seated position.   Sit to stand with overhead press using PVC 4 sets x 5  reps  Ambulation in clinic, hospital hallway from gym to elevator approx 200 feet holding onto cane but choosing to walk without one.             PATIENT EDUCATION: Education details: Goals, plan, exercise technique, body mechanics, energy conservation techniques  Person educated: Patient Education method: Explanation, Demonstration, Tactile cues, Verbal cues, and Handouts Education  comprehension: verbalized understanding, returned demonstration, verbal cues required, and tactile cues required   HOME EXERCISE PROGRAM:  No Updates today  Access Code: [email or web address] URL: [email or web address] Date: 03/01/2022 Prepared by: Janna Arch  Exercises - Seated March  - 1 x daily - 7 x weekly - 2 sets - 10 reps - 5 hold - Seated Long Arc Quad  - 1 x daily - 7 x weekly - 2 sets - 10 reps - 5 hold - Seated Hip Abduction  - 1 x daily - 7 x weekly - 2 sets - 10 reps - 5 hold - Seated Hip Adduction Isometrics with Ball  - 1 x daily - 7 x weekly - 2 sets - 10 reps - 5 hold - Seated Gluteal Sets  - 1 x daily - 7 x weekly - 2 sets - 10 reps - 5 hold - Seated Heel Toe Raises  - 1 x daily - 7 x weekly - 2 sets - 10 reps - 5 hold LE strength and balance         PT Short Term Goals       PT SHORT TERM GOAL #1   Title Pt will be independent with initial HEP in order to improve strength and balance in order to decrease fall risk and improve function at home and work.    Baseline 02/23/2022- Patient reports not doing much as far as exercise in the home currently. 04/20/2022- Patient reports compliant with HEP and no questions at this time.; 9/7 pt indep   Time 6    Period Weeks    Status Goal met   Target Date 04/06/22              PT Long Term Goals       PT LONG TERM GOAL #1   Title Pt will be independent with final HEP in order to improve strength and balance in order to decrease fall risk and improve function at home and work. 04/20/2022- Patient reports compliant with HEP and no questions at this time.    Baseline 02/23/2022= No formal HEP in place. 9/7: pt performing at least once a day, feels comfortable   Time 12    Period Weeks    Status GOAL MET   Target Date 08/11/2022      PT LONG TERM GOAL #2   Title Pt will decrease 5TSTS by at least 8 seconds in order to demonstrate clinically significant improvement in LE strength.    Baseline 02/23/2022= 31.52  sec with B hands on knees; 04/20/2022= 16.08 sec with B hands on knees   Time 12    Period Days    Status Goal Met   Target Date  05/18/22      PT LONG TERM GOAL #3   Title Pt will increase 10MWT by at least 0.23 m/s in order to demonstrate clinically significant improvement in community ambulation.    Baseline 02/23/2022= 0.39 m/s using RW; 0.45 m/s; 9/7: 0.37 m/s with QC, 0.74 m/s with RW   Time 12    Period Weeks    Status GOAL MET   Target Date 05/18/22      PT LONG TERM GOAL #4   Title Pt will improve FOTO to target score of 50 to display perceived improvements in ability to complete ADL's.    Baseline 02/23/2022= 47; 9/7: 49; 11/30=54%   Time 12    Period Weeks    Status GOAL MET   Target Date 08/11/2022      PT LONG TERM GOAL #5   Title Pt will decrease TUG to below 20 seconds/decrease in order to demonstrate decreased fall risk.    Baseline 02/23/2022=28.27 sec with RW; 04/20/2022= 20.22 with SBQC; 9/7: 14.4 sec with RW, 18.5 sec with SBQC   Time 12    Period Weeks    Status MET   Target Date 05/18/22    PT LONG TERM GOAL #6  Title Pt will decrease TUG to 14 sec or below with SBQC in order to demonstrate decreased fall risk.   Baseline  9/7: 14.4 sec with RW, 18.5 sec with SBQC; 11/30=16.85 sec without an AD. 09/15/2022= 22.14 sec with SPC  Time 12   Period Weeks   Status ONGOING  Target Date 11/05/2022   PT LONG TERM GOAL #7  Title Patient will increase six minute walk test distance to >700 ft for improved gait ability and increased ease with community participation.  Baseline 9/7: completes 4 minutes and 444 ft with RW. Test discontinued at 4 minutes due to [place]. 08/11/2022= patient ambulated 200 feet yet required 1 seated rest break- due to BLE fatigue- using SBQC: 09/15/2022= Patient ambulated 300 feet in 4 min 20 sec with 1 seated rest break using   Time 12   Period Weeks   Status ONGOING  Target Date 11/03/2022   PT LONG TERM GOAL #8  Title Pt will decrease 5TSTS by  at least 2 seconds (without UE support)  in order to demonstrate clinically significant improvement in LE strength.   Baseline 08/11/2022= 16.33 sec with B hands on knees; 09/15/2022= 22 sec with B hands on knees (patient reports increased weakness since around Christmas and just has not bounced back)   Time 12   Period Weeks  Status ONGOING  Target Date 11/03/2022            Plan -     Clinical Impression Statement Patient presents with good motivation. Still has some left sided weakness which appeared more prominent today. Pt had significant difficulty with  prolonged ambulation but was able to ambulate 200 feet without AD with multiple rest breaks. Pt will continue to benefit from skilled physical therapy intervention to address impairments, improve QOL, and attain therapy goals.    Personal Factors and Comorbidities Age;Past/Current Experience;Time since onset of injury/illness/exacerbation;Comorbidity 3+    Comorbidities COPD, History of Cancer, myasthenia gravis, chronic lumbar surgical history    Examination-Activity Limitations Lift;Squat;Bend;Stairs;Stand;Transfers;Insurance claims handler;Bathing;Hygiene/Grooming;Dressing;Toileting;Bed Mobility;Caring for Others;Continence    Examination-Participation Restrictions Yard Work;Church;Cleaning;Driving;Community Activity    Stability/Clinical Decision Making Evolving/Moderate complexity    Rehab Potential Good    Clinical Impairments Affecting Rehab Potential multipe comorbidities    PT Frequency 2x / week  PT Duration 12 weeks    PT Treatment/Interventions ADLs/Self Care Home Management;Electrical Stimulation;Moist Heat;Traction;Ultrasound;Gait training;Stair training;Functional mobility training;Therapeutic activities;Therapeutic exercise;Balance training;Neuromuscular re-education;Patient/family education;Manual techniques;Passive range of motion;Dry needling;Joint Manipulations;Cryotherapy;Vestibular;Canalith Repostioning     PT Next Visit Plan standing strengthening and balance, seated strengthening and coordination    PT Home Exercise Plan Progress LE strengthen and functional endurance, continue plan   Consulted and Agree with Plan of Care Patient           Rivka Barbara PT, DPT   10/04/22, 3:17 PM

## 2022-10-06 ENCOUNTER — Ambulatory Visit: Payer: Medicare Other | Admitting: Physical Therapy

## 2022-10-11 ENCOUNTER — Ambulatory Visit: Payer: Medicare Other

## 2022-10-13 ENCOUNTER — Ambulatory Visit: Payer: Medicare Other

## 2022-10-18 ENCOUNTER — Ambulatory Visit: Payer: Medicare Other | Attending: Neurosurgery

## 2022-10-18 DIAGNOSIS — M545 Low back pain, unspecified: Secondary | ICD-10-CM | POA: Diagnosis present

## 2022-10-18 DIAGNOSIS — R262 Difficulty in walking, not elsewhere classified: Secondary | ICD-10-CM | POA: Diagnosis present

## 2022-10-18 DIAGNOSIS — M6281 Muscle weakness (generalized): Secondary | ICD-10-CM | POA: Diagnosis present

## 2022-10-18 DIAGNOSIS — R2689 Other abnormalities of gait and mobility: Secondary | ICD-10-CM | POA: Diagnosis present

## 2022-10-18 DIAGNOSIS — R2681 Unsteadiness on feet: Secondary | ICD-10-CM | POA: Insufficient documentation

## 2022-10-18 DIAGNOSIS — R296 Repeated falls: Secondary | ICD-10-CM | POA: Diagnosis present

## 2022-10-18 DIAGNOSIS — R278 Other lack of coordination: Secondary | ICD-10-CM | POA: Diagnosis present

## 2022-10-18 DIAGNOSIS — G8929 Other chronic pain: Secondary | ICD-10-CM | POA: Insufficient documentation

## 2022-10-18 NOTE — Therapy (Signed)
OUTPATIENT PHYSICAL THERAPY TREATMENT NOTE     Patient Name: Francisco [name] MRN: [id number] DOB:12/24/1945, 77 y.o., male Today's Date: 10/18/2022  PCP: Adin Hector, MD REFERRING PROVIDER: Meade Maw MD   PT End of Session - 10/18/22 1529     Visit Number 44    Number of Visits 84    Date for PT Re-Evaluation 11/03/22    Authorization Type [employer] Medicare    Authorization Time Period 08/11/22-11/03/2022    Progress Note Due on Visit 50    PT Start Time 1520    PT Stop Time 1600    PT Time Calculation (min) 40 min    Equipment Utilized During Treatment Gait belt    Activity Tolerance Patient tolerated treatment well;No increased pain    Behavior During Therapy WFL for tasks assessed/performed                   Past Medical History:  Diagnosis Date   Arthritis    lower left hip   Atypical angina    Bilateral hand numbness    from back surgery   Bronchitis, chronic (HCC)    Cancer ([place])    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; Merkel cell cancer, and Basal cell cancer (twice; 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   Carotid stenosis    CHF (congestive heart failure) (HCC)    CKD (chronic kidney disease)    CKD (chronic kidney disease) stage 3, GFR 30-59 ml/min (HCC)    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   DDD (degenerative disc disease), cervical    Dysrhythmia    post carotid stent bradycardia; PAF 09/2020   GERD (gastroesophageal reflux disease)    Hypercholesterolemia    Hypertension    Hypothyroidism    pt takes Levothyroxine daily   Lumbosacral spinal stenosis    Myasthenia gravis, adult form ([place])    PAD (peripheral artery disease) (HCC)    Shortness of breath    Lung MD- Dr Darlin Coco   Sleep apnea    do not use CPAP every night   Past Surgical History:  Procedure Laterality Date   ANTERIOR CERVICAL DECOMP/DISCECTOMY FUSION  07/18/2011   Procedure: ANTERIOR CERVICAL DECOMPRESSION/DISCECTOMY FUSION 2 LEVELS;  Surgeon: Cooper Render Pool;   Location: [place] [facility];  Service: Neurosurgery;  Laterality: N/A;  cervical five-six, cervical six-seven anterior cervical discectomy and fusion   BACK SURGERY     in [place]     01/2020 Right, 04/2020 Left   CARDIAC CATHETERIZATION     2005 at [facility], no stents   CAROTID PTA/STENT INTERVENTION N/A 09/17/2020   Procedure: CAROTID PTA/STENT INTERVENTION;  Surgeon: Algernon Huxley, MD;  Location: [place] [facility];  Service: Cardiovascular;  Laterality: N/A;   CATARACT EXTRACTION W/PHACO Left 01/06/2020   Procedure: CATARACT EXTRACTION PHACO AND INTRAOCULAR LENS PLACEMENT (IOC) ISTENT INJ LEFT 3.81  00:33.3;  Surgeon: Eulogio Bear, MD;  Location: [place];  Service: Ophthalmology;  Laterality: Left;   CATARACT EXTRACTION W/PHACO Right 02/03/2020   Procedure: CATARACT EXTRACTION PHACO AND INTRAOCULAR LENS PLACEMENT ([place]) RIGHT ISTENT INJ;  Surgeon: Eulogio Bear, MD;  Location: [place];  Service: Ophthalmology;  Laterality: Right;  4.29 0:35.6   COLONOSCOPY     HERNIA REPAIR Left    inguinal hernia repair in [place] MICRODISCECTOMY Left 02/24/2014   Procedure: LUMBAR LAMINECTOMY/DECOMPRESSION MICRODISCECTOMY LUMBAR THREE-FOUR, FOUR-FIVE, LEFT FIVE-SACRAL ONE ;  Surgeon: Charlie Pitter, MD;  Location: [place] [facility];  Service: Neurosurgery;  Laterality: Left;  LUMBAR LAMINECTOMY/DECOMPRESSION MICRODISCECTOMY LUMBAR THREE-FOUR, FOUR-FIVE, LEFT FIVE-SACRAL ONE    LUMBAR LAMINECTOMY/DECOMPRESSION MICRODISCECTOMY N/A 05/03/2021   Procedure: Laminectomy and Foraminotomy - L2-L3;  Surgeon: Earnie Larsson, MD;  Location: [place];  Service: Neurosurgery;  Laterality: N/A;  3C   POSTERIOR CERVICAL FUSION/FORAMINOTOMY N/A 08/07/2020   Procedure: C3-6 POSTERIOR FUSION WITH DECOMPRESSION;  Surgeon: Meade Maw, MD;  Location: [facility];  Service: Neurosurgery;  Laterality: N/A;   PROSTATECTOMY  8/14   [facility] Dr  Mare Ferrari    Patient Active Problem List   Diagnosis Date Noted   SVT (supraventricular tachycardia) 12/30/2021   Hypothyroidism 12/30/2021   Parkinson's disease 12/30/2021   Elevated troponin 12/30/2021   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   Acquired thrombophilia ([place]) 01/13/2021   History of decompression of median nerve 01/01/2021   Paroxysmal atrial fibrillation ([place]) 10/06/2020   Carotid stenosis, right 09/17/2020   S/P cervical spinal fusion    Leukocytosis    Essential hypertension    Anemia of chronic disease    Postoperative pain    Neuropathic pain    Cervical myelopathy ([place]) 08/07/2020   Preop cardiovascular exam 07/17/2020   SOB (shortness of breath) on exertion 07/17/2020   Leg weakness, bilateral 07/13/2020   Aortic atherosclerosis ([place]) 07/09/2020   Body mass index (BMI) 34.0-34.9, adult 12/25/2019   Myalgia 10/30/2019   Lumbar post-laminectomy syndrome 10/24/2019   PAD (peripheral artery disease) ([place]) 06/06/2019   CKD (chronic kidney disease) stage 3, GFR 30-59 ml/min (HCC) 04/29/2019   B12 deficiency 01/23/2019   Left arm numbness 02/28/2018   Neck pain 02/28/2018   Anemia 02/02/2017   DDD (degenerative disc disease), cervical 02/02/2017   Hypothyroid 02/02/2017   MRSA (methicillin resistant staph aureus) culture positive 02/02/2017   Nocturnal hypoxia 02/02/2017   Senile purpura ([place]) 10/03/2016   Essential hypertension, benign 09/16/2016   Bilateral carotid artery disease ([place]) 09/16/2016   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/18/2016   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   History of prostate cancer 12/21/2015   Left carpal tunnel syndrome 11/11/2015   Kidney stone on left side 07/05/2015   Myasthenia gravis ([place]) 05/26/2015   Radiculitis 01/05/2015   Long-term use of high-risk medication 10/15/2014   Persistent cough 09/10/2014   Pure hypercholesterolemia 07/25/2014   Spinal stenosis, lumbar region, with neurogenic  claudication 02/24/2014   Lumbosacral stenosis with neurogenic claudication 02/24/2014   COPD (chronic obstructive pulmonary disease) ([place]) 01/22/2014    REFERRING DIAG: balance disorder   THERAPY DIAG:  Muscle weakness (generalized)  Difficulty in walking, not elsewhere classified  Unsteadiness on feet  Other abnormalities of gait and mobility  Rationale for Evaluation and Treatment Rehabilitation  PERTINENT HISTORY: Staci Carver is a 77 year old male referred to O[facility] neuro for difficulty with balance. He was also just recently dx in May 2023 with Impingement syndrome of left shoulder (M75.42) Impingement syndrome of left shoulder (primary encounter diagnosis) Left shoulder pain, unspecified chronicity Nontraumatic rupture of left proximal biceps tendon Past medical hx includes Lumbar laminectomy 04/2021. C3-6 decompression, fusion on 11/26, myasthenia gravis, 2012 ACDF 5/6, 6/7; OSA, hypothyroidism, HTN, COPD, multiple back surgeries, 2021 carpal tunnel release.  PRECAUTIONS: Fall  SUBJECTIVE:  Pt reports very much under the weather and weak with COVID since last session, sick and (+) test day after last session. He continues to bounce back but remains off baseline still.   PAIN:  Are you having pain? None reported  TODAY'S TREATMENT: 10/18/22 -NUSTEP LEVEL 1, SEAT 10, LEGS ONLY 3.21mn, then 3.5' [place]   [place] activity: -148/62mg, 67bpm, 97% SpO2 on Room air, no symptoms  -standing heel raises 1x13 bilat, BUE support (much more sore/painful) -standing ankle DF 1x12 Rt; seated for left (much weaker) 1x12 @ 2.5lb (heel on airex pad)  -STS from chair hands free 1x5, then unable to rise after 4 more attempts  -Left ankle DF seated, heel on airexpad 1x12 @ 2.5lb  -STS x 12 hands on knees from chair+airex   -AMB hands free in // bars [id number] 180 degree Rt turns, 2 left turns, ended due to fatigue and back pain     PATIENT  EDUCATION: Education details: Goals, plan, exercise technique, body mechanics, energy conservation techniques  Person educated: Patient Education method: Explanation, Demonstration, Tactile cues, Verbal cues, and Handouts Education comprehension: verbalized understanding, returned demonstration, verbal cues required, and tactile cues required   HOME EXERCISE PROGRAM:  No Updates today  Access Code: [email or web address]: [email or web address] Date: 03/01/2022 Prepared by: MarJanna Archxercises - Seated March  - 1 x daily - 7 x weekly - 2 sets - 10 reps - 5 hold - Seated Long Arc Quad  - 1 x daily - 7 x weekly - 2 sets - 10 reps - 5 hold - Seated Hip Abduction  - 1 x daily - 7 x weekly - 2 sets - 10 reps - 5 hold - Seated Hip Adduction Isometrics with Ball  - 1 x daily - 7 x weekly - 2 sets - 10 reps - 5 hold - Seated Gluteal Sets  - 1 x daily - 7 x weekly - 2 sets - 10 reps - 5 hold - Seated Heel Toe Raises  - 1 x daily - 7 x weekly - 2 sets - 10 reps - 5 hold LE strength and balance         PT Short Term Goals       PT SHORT TERM GOAL #1   Title Pt will be independent with initial HEP in order to improve strength and balance in order to decrease fall risk and improve function at home and work.    Baseline 02/23/2022- Patient reports not doing much as far as exercise in the home currently. 04/20/2022- Patient reports compliant with HEP and no questions at this time.; 9/7 pt indep   Time 6    Period Weeks    Status Goal met   Target Date 04/06/22              PT Long Term Goals       PT LONG TERM GOAL #1   Title Pt will be independent with final HEP in order to improve strength and balance in order to decrease fall risk and improve function at home and work. 04/20/2022- Patient reports compliant with HEP and no questions at this time.    Baseline 02/23/2022= No formal HEP in place. 9/7: pt performing at least once a day, feels comfortable   Time 12    Period Weeks     Status GOAL MET   Target Date 08/11/2022      PT LONG TERM GOAL #2   Title Pt will decrease 5TSTS by at least 8 seconds in order to demonstrate clinically significant improvement in LE strength.    Baseline 02/23/2022= 31.52 sec with B hands on knees; 04/20/2022= 16.08 sec with B hands  on knees   Time 12    Period Days    Status Goal Met   Target Date 05/18/22      PT LONG TERM GOAL #3   Title Pt will increase 10MWT by at least 0.23 m/s in order to demonstrate clinically significant improvement in community ambulation.    Baseline 02/23/2022= 0.39 m/s using RW; 0.45 m/s; 9/7: 0.37 m/s with QC, 0.74 m/s with RW   Time 12    Period Weeks    Status GOAL MET   Target Date 05/18/22      PT LONG TERM GOAL #4   Title Pt will improve FOTO to target score of 50 to display perceived improvements in ability to complete ADL's.    Baseline 02/23/2022= 47; 9/7: 49; 11/30=54%   Time 12    Period Weeks    Status GOAL MET   Target Date 08/11/2022      PT LONG TERM GOAL #5   Title Pt will decrease TUG to below 20 seconds/decrease in order to demonstrate decreased fall risk.    Baseline 02/23/2022=28.27 sec with RW; 04/20/2022= 20.22 with SBQC; 9/7: 14.4 sec with RW, 18.5 sec with SBQC   Time 12    Period Weeks    Status MET   Target Date 05/18/22    PT LONG TERM GOAL #6  Title Pt will decrease TUG to 14 sec or below with SBQC in order to demonstrate decreased fall risk.   Baseline  9/7: 14.4 sec with RW, 18.5 sec with SBQC; 11/30=16.85 sec without an AD. 09/15/2022= 22.14 sec with SPC  Time 12   Period Weeks   Status ONGOING  Target Date 11/05/2022   PT LONG TERM GOAL #7  Title Patient will increase six minute walk test distance to >700 ft for improved gait ability and increased ease with community participation.  Baseline 9/7: completes 4 minutes and 444 ft with RW. Test discontinued at 4 minutes due to [place]. 08/11/2022= patient ambulated 200 feet yet required 1 seated rest break- due to BLE  fatigue- using SBQC: 09/15/2022= Patient ambulated 300 feet in 4 min 20 sec with 1 seated rest break using   Time 12   Period Weeks   Status ONGOING  Target Date 11/03/2022   PT LONG TERM GOAL #8  Title Pt will decrease 5TSTS by at least 2 seconds (without UE support)  in order to demonstrate clinically significant improvement in LE strength.   Baseline 08/11/2022= 16.33 sec with B hands on knees; 09/15/2022= 22 sec with B hands on knees (patient reports increased weakness since around Christmas and just has not bounced back)   Time 12   Period Weeks  Status ONGOING  Target Date 11/03/2022            Plan -     Clinical Impression Statement Continued with current plan of care as laid out in evaluation and recent prior sessions. All interventions tolerated as expected by author. Mobility and strength continue to progress as anticipated. Recovery intervals given as needed based on signs of exertion and/or pt request. Pt educated on best technique for each intervention- author uses verbal, visual, tactile cues to optimize learning. Author takes steps to maximize patient independence when appropriate. Pt remains highly motivated. Pt closely monitored throughout session for safe activity response, as well as to maximize patient safety during interventions. The patient's therapy prognosis indicates continued potential for improvement, anticipate that future progress is attainable in a reasonable/predictable timeframe. Maximum improvement is within  reach. Pt will continue to benefit from skilled PT services to address deficits and impairment identified in evaluation in order to maximize independence and safety in basic mobility required for performance of ADL, IADL, and leisure.     Personal Factors and Comorbidities Age;Past/Current Experience;Time since onset of injury/illness/exacerbation;Comorbidity 3+    Comorbidities COPD, History of Cancer, myasthenia gravis, chronic lumbar surgical history     Examination-Activity Limitations Lift;Squat;Bend;Stairs;Stand;Transfers;Insurance claims handler;Bathing;Hygiene/Grooming;Dressing;Toileting;Bed Mobility;Caring for Others;Continence    Examination-Participation Restrictions Yard Work;Church;Cleaning;Driving;Community Activity    Stability/Clinical Decision Making Evolving/Moderate complexity    Rehab Potential Good    Clinical Impairments Affecting Rehab Potential multipe comorbidities    PT Frequency 2x / week    PT Duration 12 weeks    PT Treatment/Interventions ADLs/Self Care Home Management;Electrical Stimulation;Moist Heat;Traction;Ultrasound;Gait training;Stair training;Functional mobility training;Therapeutic activities;Therapeutic exercise;Balance training;Neuromuscular re-education;Patient/family education;Manual techniques;Passive range of motion;Dry needling;Joint Manipulations;Cryotherapy;Vestibular;Canalith Repostioning    PT Next Visit Plan standing strengthening and balance, seated strengthening and coordination    PT Home Exercise Plan Progress LE strengthen and functional endurance, continue plan   Consulted and Agree with Plan of Care Patient             3:36 PM, 10/18/22 Etta Grandchild, PT, DPT Physical Therapist - [place] [phone]     10/18/22, 3:36 PM

## 2022-10-20 ENCOUNTER — Ambulatory Visit: Payer: Medicare Other

## 2022-10-25 ENCOUNTER — Ambulatory Visit: Payer: Medicare Other

## 2022-10-25 DIAGNOSIS — R296 Repeated falls: Secondary | ICD-10-CM

## 2022-10-25 DIAGNOSIS — R278 Other lack of coordination: Secondary | ICD-10-CM

## 2022-10-25 DIAGNOSIS — R2681 Unsteadiness on feet: Secondary | ICD-10-CM

## 2022-10-25 DIAGNOSIS — R2689 Other abnormalities of gait and mobility: Secondary | ICD-10-CM

## 2022-10-25 DIAGNOSIS — R262 Difficulty in walking, not elsewhere classified: Secondary | ICD-10-CM

## 2022-10-25 DIAGNOSIS — M6281 Muscle weakness (generalized): Secondary | ICD-10-CM

## 2022-10-25 DIAGNOSIS — G8929 Other chronic pain: Secondary | ICD-10-CM

## 2022-10-25 NOTE — Therapy (Signed)
OUTPATIENT PHYSICAL THERAPY TREATMENT NOTE     Patient Name: Francisco [name] MRN: [id number] DOB:[birth year], 77 y.o., male Today's Date: 10/25/2022  PCP: Adin Hector, MD REFERRING PROVIDER: Meade Maw MD   PT End of Session - 10/25/22 1527     Visit Number [id number]    Number of Visits 54    Date for PT Re-Evaluation 11/03/22    Authorization Type [employer] Medicare    Authorization Time Period 08/11/22-11/03/2022    Progress Note Due on Visit 67    PT Start Time 1518    PT Stop Time 1600    PT Time Calculation (min) 42 min    Equipment Utilized During Treatment Gait belt    Activity Tolerance Patient tolerated treatment well;No increased pain    Behavior During Therapy WFL for tasks assessed/performed                   Past Medical History:  Diagnosis Date   Arthritis    lower left hip   Atypical angina    Bilateral hand numbness    from back surgery   Bronchitis, chronic (HCC)    Cancer ([place])    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; Merkel cell cancer, and Basal cell cancer (twice; 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   Carotid stenosis    CHF (congestive heart failure) (HCC)    CKD (chronic kidney disease)    CKD (chronic kidney disease) stage 3, GFR 30-59 ml/min (HCC)    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   DDD (degenerative disc disease), cervical    Dysrhythmia    post carotid stent bradycardia; PAF 09/2020   GERD (gastroesophageal reflux disease)    Hypercholesterolemia    Hypertension    Hypothyroidism    pt takes Levothyroxine daily   Lumbosacral spinal stenosis    Myasthenia gravis, adult form ([place])    PAD (peripheral artery disease) (HCC)    Shortness of breath    Lung MD- Dr Darlin Coco   Sleep apnea    do not use CPAP every night   Past Surgical History:  Procedure Laterality Date   ANTERIOR CERVICAL DECOMP/DISCECTOMY FUSION  07/18/2011   Procedure: ANTERIOR CERVICAL DECOMPRESSION/DISCECTOMY FUSION 2 LEVELS;  Surgeon: Cooper Render  Pool;  Location: [place] [facility];  Service: Neurosurgery;  Laterality: N/A;  cervical five-six, cervical six-seven anterior cervical discectomy and fusion   BACK SURGERY     in [place]     01/2020 Right, 04/2020 Left   CARDIAC CATHETERIZATION     2005 at [facility], no stents   CAROTID PTA/STENT INTERVENTION N/A 09/17/2020   Procedure: CAROTID PTA/STENT INTERVENTION;  Surgeon: Algernon Huxley, MD;  Location: [place] [facility];  Service: Cardiovascular;  Laterality: N/A;   CATARACT EXTRACTION W/PHACO Left 01/06/2020   Procedure: CATARACT EXTRACTION PHACO AND INTRAOCULAR LENS PLACEMENT (IOC) ISTENT INJ LEFT 3.81  00:33.3;  Surgeon: Eulogio Bear, MD;  Location: [place];  Service: Ophthalmology;  Laterality: Left;   CATARACT EXTRACTION W/PHACO Right 02/03/2020   Procedure: CATARACT EXTRACTION PHACO AND INTRAOCULAR LENS PLACEMENT ([place]) RIGHT ISTENT INJ;  Surgeon: Eulogio Bear, MD;  Location: [place];  Service: Ophthalmology;  Laterality: Right;  4.29 0:35.6   COLONOSCOPY     HERNIA REPAIR Left    inguinal hernia repair in [place] MICRODISCECTOMY Left 02/24/2014   Procedure: LUMBAR LAMINECTOMY/DECOMPRESSION MICRODISCECTOMY LUMBAR THREE-FOUR, FOUR-FIVE, LEFT FIVE-SACRAL ONE ;  Surgeon: Charlie Pitter, MD;  Location: [place] [facility];  Service: Neurosurgery;  Laterality: Left;  LUMBAR LAMINECTOMY/DECOMPRESSION MICRODISCECTOMY LUMBAR THREE-FOUR, FOUR-FIVE, LEFT FIVE-SACRAL ONE    LUMBAR LAMINECTOMY/DECOMPRESSION MICRODISCECTOMY N/A 05/03/2021   Procedure: Laminectomy and Foraminotomy - L2-L3;  Surgeon: Earnie Larsson, MD;  Location: [place];  Service: Neurosurgery;  Laterality: N/A;  3C   POSTERIOR CERVICAL FUSION/FORAMINOTOMY N/A 08/07/2020   Procedure: C3-6 POSTERIOR FUSION WITH DECOMPRESSION;  Surgeon: Meade Maw, MD;  Location: [facility];  Service: Neurosurgery;  Laterality: N/A;   PROSTATECTOMY  8/14   [facility]  Dr Mare Ferrari    Patient Active Problem List   Diagnosis Date Noted   SVT (supraventricular tachycardia) 12/30/2021   Hypothyroidism 12/30/2021   Parkinson's disease 12/30/2021   Elevated troponin 12/30/2021   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   Acquired thrombophilia ([place]) 01/13/2021   History of decompression of median nerve 01/01/2021   Paroxysmal atrial fibrillation ([place]) 10/06/2020   Carotid stenosis, right 09/17/2020   S/P cervical spinal fusion    Leukocytosis    Essential hypertension    Anemia of chronic disease    Postoperative pain    Neuropathic pain    Cervical myelopathy ([place]) 08/07/2020   Preop cardiovascular exam 07/17/2020   SOB (shortness of breath) on exertion 07/17/2020   Leg weakness, bilateral 07/13/2020   Aortic atherosclerosis ([place]) 07/09/2020   Body mass index (BMI) 34.0-34.9, adult 12/25/2019   Myalgia 10/30/2019   Lumbar post-laminectomy syndrome 10/24/2019   PAD (peripheral artery disease) ([place]) 06/06/2019   CKD (chronic kidney disease) stage 3, GFR 30-59 ml/min (HCC) 04/29/2019   B12 deficiency 01/23/2019   Left arm numbness 02/28/2018   Neck pain 02/28/2018   Anemia 02/02/2017   DDD (degenerative disc disease), cervical 02/02/2017   Hypothyroid 02/02/2017   MRSA (methicillin resistant staph aureus) culture positive 02/02/2017   Nocturnal hypoxia 02/02/2017   Senile purpura ([place]) 10/03/2016   Essential hypertension, benign 09/16/2016   Bilateral carotid artery disease ([place]) 09/16/2016   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/18/2016   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   History of prostate cancer 12/21/2015   Left carpal tunnel syndrome 11/11/2015   Kidney stone on left side 07/05/2015   Myasthenia gravis ([place]) 05/26/2015   Radiculitis 01/05/2015   Long-term use of high-risk medication 10/15/2014   Persistent cough 09/10/2014   Pure hypercholesterolemia 07/25/2014   Spinal stenosis, lumbar region, with  neurogenic claudication 02/24/2014   Lumbosacral stenosis with neurogenic claudication 02/24/2014   COPD (chronic obstructive pulmonary disease) ([place]) 01/22/2014    REFERRING DIAG: balance disorder   THERAPY DIAG:  Difficulty in walking, not elsewhere classified  Muscle weakness (generalized)  Unsteadiness on feet  Other abnormalities of gait and mobility  Other lack of coordination  Chronic bilateral low back pain, unspecified whether sciatica present  Repeated falls  Rationale for Evaluation and Treatment Rehabilitation  PERTINENT HISTORY: Srithan Bleazard is a 77 year old male referred to OPPT neuro for difficulty with balance. He was also just recently dx in May 2023 with Impingement syndrome of left shoulder (M75.42) Impingement syndrome of left shoulder (primary encounter diagnosis) Left shoulder pain, unspecified chronicity Nontraumatic rupture of left proximal biceps tendon Past medical hx includes Lumbar laminectomy 04/2021. C3-6 decompression, fusion on 11/26, myasthenia gravis, 2012 ACDF 5/6, 6/7; OSA, hypothyroidism, HTN, COPD, multiple back surgeries, 2021 carpal tunnel release.  PRECAUTIONS: Fall  SUBJECTIVE:  Patient reports low energy from recent illness but doing better overall.    PAIN:  Are  you having pain? None reported  TODAY'S TREATMENT: 10/25/22 -NUSTEP  LEVEL 1 for 2 min Leval 3 for 1 min  Level 5 for 1 min Level 1 min 1 min  Level 3, then 4, then 5 for 20 sec each Level 1 for 1 min cool down    Vitals assessed halfway throughout NUSTEP activity: -78bpm, 97% SpO2 on Room air  -step tap (6" block) with3lb AW x 15 reps alt LE with min UE support today - Step up with 3lb AW x 12 reps each LE  -STS x 6 without UE Support from chair- Patient fatigued but pushed to 6 reps  -AMB in // bar with min UE support using agility ladder for forward stepping down and back x 4  with 3lb AW BLE x 2 trials total. VC and visual feedback from ladder to increase  step length.  -AMB in // bar without UE support and no ankle weight  - down and back x 2 (for 2 separate trials) with good reciprocal step and no LOB- patient stopped due to fatigue only.      PATIENT EDUCATION: Education details: Goals, plan, exercise technique, body mechanics, energy conservation techniques  Person educated: Patient Education method: Explanation, Demonstration, Tactile cues, Verbal cues, and Handouts Education comprehension: verbalized understanding, returned demonstration, verbal cues required, and tactile cues required   HOME EXERCISE PROGRAM:  No Updates today  Access Code: [email or web address] URL: [email or web address] Date: 03/01/2022 Prepared by: Janna Arch  Exercises - Seated March  - 1 x daily - 7 x weekly - 2 sets - 10 reps - 5 hold - Seated Long Arc Quad  - 1 x daily - 7 x weekly - 2 sets - 10 reps - 5 hold - Seated Hip Abduction  - 1 x daily - 7 x weekly - 2 sets - 10 reps - 5 hold - Seated Hip Adduction Isometrics with Ball  - 1 x daily - 7 x weekly - 2 sets - 10 reps - 5 hold - Seated Gluteal Sets  - 1 x daily - 7 x weekly - 2 sets - 10 reps - 5 hold - Seated Heel Toe Raises  - 1 x daily - 7 x weekly - 2 sets - 10 reps - 5 hold LE strength and balance         PT Short Term Goals       PT SHORT TERM GOAL #1   Title Pt will be independent with initial HEP in order to improve strength and balance in order to decrease fall risk and improve function at home and work.    Baseline 02/23/2022- Patient reports not doing much as far as exercise in the home currently. 04/20/2022- Patient reports compliant with HEP and no questions at this time.; 9/7 pt indep   Time 6    Period Weeks    Status Goal met   Target Date 04/06/22              PT Long Term Goals       PT LONG TERM GOAL #1   Title Pt will be independent with final HEP in order to improve strength and balance in order to decrease fall risk and improve function at home and work.  04/20/2022- Patient reports compliant with HEP and no questions at this time.    Baseline 02/23/2022= No formal HEP in place. 9/7: pt performing at least once a day, feels comfortable   Time 12    Period Weeks  Status GOAL MET   Target Date 08/11/2022      PT LONG TERM GOAL #2   Title Pt will decrease 5TSTS by at least 8 seconds in order to demonstrate clinically significant improvement in LE strength.    Baseline 02/23/2022= 31.52 sec with B hands on knees; 04/20/2022= 16.08 sec with B hands on knees   Time 12    Period Days    Status Goal Met   Target Date 05/18/22      PT LONG TERM GOAL #3   Title Pt will increase 10MWT by at least 0.23 m/s in order to demonstrate clinically significant improvement in community ambulation.    Baseline 02/23/2022= 0.39 m/s using RW; 0.45 m/s; 9/7: 0.37 m/s with QC, 0.74 m/s with RW   Time 12    Period Weeks    Status GOAL MET   Target Date 05/18/22      PT LONG TERM GOAL #4   Title Pt will improve FOTO to target score of 50 to display perceived improvements in ability to complete ADL's.    Baseline 02/23/2022= 47; 9/7: 49; 11/30=54%   Time 12    Period Weeks    Status GOAL MET   Target Date 08/11/2022      PT LONG TERM GOAL #5   Title Pt will decrease TUG to below 20 seconds/decrease in order to demonstrate decreased fall risk.    Baseline 02/23/2022=28.27 sec with RW; 04/20/2022= 20.22 with SBQC; 9/7: 14.4 sec with RW, 18.5 sec with SBQC   Time 12    Period Weeks    Status MET   Target Date 05/18/22    PT LONG TERM GOAL #6  Title Pt will decrease TUG to 14 sec or below with SBQC in order to demonstrate decreased fall risk.   Baseline  9/7: 14.4 sec with RW, 18.5 sec with SBQC; 11/30=16.85 sec without an AD. 09/15/2022= 22.14 sec with SPC  Time 12   Period Weeks   Status ONGOING  Target Date 11/05/2022   PT LONG TERM GOAL #7  Title Patient will increase six minute walk test distance to >700 ft for improved gait ability and increased ease with  community participation.  Baseline 9/7: completes 4 minutes and 444 ft with RW. Test discontinued at 4 minutes due to [place]. 08/11/2022= patient ambulated 200 feet yet required 1 seated rest break- due to BLE fatigue- using SBQC: 09/15/2022= Patient ambulated 300 feet in 4 min 20 sec with 1 seated rest break using   Time 12   Period Weeks   Status ONGOING  Target Date 11/03/2022   PT LONG TERM GOAL #8  Title Pt will decrease 5TSTS by at least 2 seconds (without UE support)  in order to demonstrate clinically significant improvement in LE strength.   Baseline 08/11/2022= 16.33 sec with B hands on knees; 09/15/2022= 22 sec with B hands on knees (patient reports increased weakness since around Christmas and just has not bounced back)   Time 12   Period Weeks  Status ONGOING  Target Date 11/03/2022            Plan -     Clinical Impression Statement Continued with current plan of care for LE strengthening and improved functional mobility. Reviewed via VC- best technique for each intervention. He demonstrated improved overall standing/ambulation endurance and able clear left LE even when tired today with less VC for posture. Pt will continue to benefit from skilled PT services to address deficits and impairment identified  in evaluation in order to maximize independence and safety in basic mobility required for performance of ADL, IADL, and leisure.     Personal Factors and Comorbidities Age;Past/Current Experience;Time since onset of injury/illness/exacerbation;Comorbidity 3+    Comorbidities COPD, History of Cancer, myasthenia gravis, chronic lumbar surgical history    Examination-Activity Limitations Lift;Squat;Bend;Stairs;Stand;Transfers;Insurance claims handler;Bathing;Hygiene/Grooming;Dressing;Toileting;Bed Mobility;Caring for Others;Continence    Examination-Participation Restrictions Yard Work;Church;Cleaning;Driving;Community Activity    Stability/Clinical Decision Making  Evolving/Moderate complexity    Rehab Potential Good    Clinical Impairments Affecting Rehab Potential multipe comorbidities    PT Frequency 2x / week    PT Duration 12 weeks    PT Treatment/Interventions ADLs/Self Care Home Management;Electrical Stimulation;Moist Heat;Traction;Ultrasound;Gait training;Stair training;Functional mobility training;Therapeutic activities;Therapeutic exercise;Balance training;Neuromuscular re-education;Patient/family education;Manual techniques;Passive range of motion;Dry needling;Joint Manipulations;Cryotherapy;Vestibular;Canalith Repostioning    PT Next Visit Plan standing strengthening and balance, seated strengthening and coordination    PT Home Exercise Plan Progress LE strengthen and functional endurance, continue plan   Consulted and Agree with Plan of Care Patient             4:23 PM, 10/25/22 Ollen Bowl, PT Physical Therapist - [place] [phone]     10/25/22, 4:23 PM

## 2022-10-27 ENCOUNTER — Ambulatory Visit: Payer: Medicare Other

## 2022-10-27 ENCOUNTER — Other Ambulatory Visit (INDEPENDENT_AMBULATORY_CARE_PROVIDER_SITE_OTHER): Payer: Self-pay | Admitting: Nurse Practitioner

## 2022-10-27 DIAGNOSIS — M6281 Muscle weakness (generalized): Secondary | ICD-10-CM

## 2022-10-27 DIAGNOSIS — R2689 Other abnormalities of gait and mobility: Secondary | ICD-10-CM

## 2022-10-27 DIAGNOSIS — R2681 Unsteadiness on feet: Secondary | ICD-10-CM

## 2022-10-27 DIAGNOSIS — M545 Low back pain, unspecified: Secondary | ICD-10-CM

## 2022-10-27 DIAGNOSIS — R262 Difficulty in walking, not elsewhere classified: Secondary | ICD-10-CM

## 2022-10-27 DIAGNOSIS — R278 Other lack of coordination: Secondary | ICD-10-CM

## 2022-10-27 NOTE — Therapy (Signed)
OUTPATIENT PHYSICAL THERAPY TREATMENT NOTE     Patient Name: Francisco [name] MRN: [id number] DOB:November 11, 1945, 77 y.o., male Today's Date: 10/27/2022  PCP: Adin Hector, MD REFERRING PROVIDER: Meade Maw MD   PT End of Session - 10/27/22 1524     Visit Number 48    Number of Visits 27    Date for PT Re-Evaluation 11/03/22    Authorization Type [employer] Medicare    Authorization Time Period 08/11/22-11/03/2022    Progress Note Due on Visit 15    PT Start Time 1515    PT Stop Time 1600    PT Time Calculation (min) 45 min    Equipment Utilized During Treatment Gait belt    Activity Tolerance Patient tolerated treatment well;No increased pain    Behavior During Therapy WFL for tasks assessed/performed                   Past Medical History:  Diagnosis Date   Arthritis    lower left hip   Atypical angina    Bilateral hand numbness    from back surgery   Bronchitis, chronic (HCC)    Cancer ([place])    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; Merkel cell cancer, and Basal cell cancer (twice; 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   Carotid stenosis    CHF (congestive heart failure) (HCC)    CKD (chronic kidney disease)    CKD (chronic kidney disease) stage 3, GFR 30-59 ml/min (HCC)    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   DDD (degenerative disc disease), cervical    Dysrhythmia    post carotid stent bradycardia; PAF 09/2020   GERD (gastroesophageal reflux disease)    Hypercholesterolemia    Hypertension    Hypothyroidism    pt takes Levothyroxine daily   Lumbosacral spinal stenosis    Myasthenia gravis, adult form ([place])    PAD (peripheral artery disease) (HCC)    Shortness of breath    Lung MD- Dr Darlin Coco   Sleep apnea    do not use CPAP every night   Past Surgical History:  Procedure Laterality Date   ANTERIOR CERVICAL DECOMP/DISCECTOMY FUSION  07/18/2011   Procedure: ANTERIOR CERVICAL DECOMPRESSION/DISCECTOMY FUSION 2 LEVELS;  Surgeon: Cooper Render  Pool;  Location: [facility];  Service: Neurosurgery;  Laterality: N/A;  cervical five-six, cervical six-seven anterior cervical discectomy and fusion   BACK SURGERY     in [place]     01/2020 Right, 04/2020 Left   CARDIAC CATHETERIZATION     2005 at [facility], no stents   CAROTID PTA/STENT INTERVENTION N/A 09/17/2020   Procedure: CAROTID PTA/STENT INTERVENTION;  Surgeon: Algernon Huxley, MD;  Location: [facility];  Service: Cardiovascular;  Laterality: N/A;   CATARACT EXTRACTION W/PHACO Left 01/06/2020   Procedure: CATARACT EXTRACTION PHACO AND INTRAOCULAR LENS PLACEMENT (IOC) ISTENT INJ LEFT 3.81  00:33.3;  Surgeon: Eulogio Bear, MD;  Location: [facility];  Service: Ophthalmology;  Laterality: Left;   CATARACT EXTRACTION W/PHACO Right 02/03/2020   Procedure: CATARACT EXTRACTION PHACO AND INTRAOCULAR LENS PLACEMENT ([place]) RIGHT ISTENT INJ;  Surgeon: Eulogio Bear, MD;  Location: [place];  Service: Ophthalmology;  Laterality: Right;  4.29 0:35.6   COLONOSCOPY     HERNIA REPAIR Left    inguinal hernia repair in [place] MICRODISCECTOMY Left 02/24/2014   Procedure: LUMBAR LAMINECTOMY/DECOMPRESSION MICRODISCECTOMY LUMBAR THREE-FOUR, FOUR-FIVE, LEFT FIVE-SACRAL ONE ;  Surgeon: Charlie Pitter, MD;  Location: [facility];  Service: Neurosurgery;  Laterality: Left;  LUMBAR LAMINECTOMY/DECOMPRESSION MICRODISCECTOMY LUMBAR THREE-FOUR, FOUR-FIVE, LEFT FIVE-SACRAL ONE    LUMBAR LAMINECTOMY/DECOMPRESSION MICRODISCECTOMY N/A 05/03/2021   Procedure: Laminectomy and Foraminotomy - L2-L3;  Surgeon: Earnie Larsson, MD;  Location: [facility];  Service: Neurosurgery;  Laterality: N/A;  3C   POSTERIOR CERVICAL FUSION/FORAMINOTOMY N/A 08/07/2020   Procedure: C3-6 POSTERIOR FUSION WITH DECOMPRESSION;  Surgeon: Meade Maw, MD;  Location: [facility];  Service: Neurosurgery;  Laterality: N/A;   PROSTATECTOMY  8/14   [facility]  Dr Mare Ferrari    Patient Active Problem List   Diagnosis Date Noted   SVT (supraventricular tachycardia) 12/30/2021   Hypothyroidism 12/30/2021   Parkinson's disease 12/30/2021   Elevated troponin 12/30/2021   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   Acquired thrombophilia ([place]) 01/13/2021   History of decompression of median nerve 01/01/2021   Paroxysmal atrial fibrillation ([place]) 10/06/2020   Carotid stenosis, right 09/17/2020   S/P cervical spinal fusion    Leukocytosis    Essential hypertension    Anemia of chronic disease    Postoperative pain    Neuropathic pain    Cervical myelopathy ([place]) 08/07/2020   Preop cardiovascular exam 07/17/2020   SOB (shortness of breath) on exertion 07/17/2020   Leg weakness, bilateral 07/13/2020   Aortic atherosclerosis ([place]) 07/09/2020   Body mass index (BMI) 34.0-34.9, adult 12/25/2019   Myalgia 10/30/2019   Lumbar post-laminectomy syndrome 10/24/2019   PAD (peripheral artery disease) ([place]) 06/06/2019   CKD (chronic kidney disease) stage 3, GFR 30-59 ml/min (HCC) 04/29/2019   B12 deficiency 01/23/2019   Left arm numbness 02/28/2018   Neck pain 02/28/2018   Anemia 02/02/2017   DDD (degenerative disc disease), cervical 02/02/2017   Hypothyroid 02/02/2017   MRSA (methicillin resistant staph aureus) culture positive 02/02/2017   Nocturnal hypoxia 02/02/2017   Senile purpura ([place]) 10/03/2016   Essential hypertension, benign 09/16/2016   Bilateral carotid artery disease ([place]) 09/16/2016   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/18/2016   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   History of prostate cancer 12/21/2015   Left carpal tunnel syndrome 11/11/2015   Kidney stone on left side 07/05/2015   Myasthenia gravis ([place]) 05/26/2015   Radiculitis 01/05/2015   Long-term use of high-risk medication 10/15/2014   Persistent cough 09/10/2014   Pure hypercholesterolemia 07/25/2014   Spinal stenosis, lumbar region, with  neurogenic claudication 02/24/2014   Lumbosacral stenosis with neurogenic claudication 02/24/2014   COPD (chronic obstructive pulmonary disease) ([place]) 01/22/2014    REFERRING DIAG: balance disorder   THERAPY DIAG:  Difficulty in walking, not elsewhere classified  Muscle weakness (generalized)  Unsteadiness on feet  Other abnormalities of gait and mobility  Other lack of coordination  Chronic bilateral low back pain, unspecified whether sciatica present  Rationale for Evaluation and Treatment Rehabilitation  PERTINENT HISTORY: Cordairo Mumby is a 77 year old male referred to [facility] neuro for difficulty with balance. He was also just recently dx in May 2023 with Impingement syndrome of left shoulder (M75.42) Impingement syndrome of left shoulder (primary encounter diagnosis) Left shoulder pain, unspecified chronicity Nontraumatic rupture of left proximal biceps tendon Past medical hx includes Lumbar laminectomy 04/2021. C3-6 decompression, fusion on 11/26, myasthenia gravis, 2012 ACDF 5/6, 6/7; OSA, hypothyroidism, HTN, COPD, multiple back surgeries, 2021 carpal tunnel release.  PRECAUTIONS: Fall  SUBJECTIVE:  Patient reports feeling better today than he has in a while.    PAIN:  Are you having pain?  None reported  TODAY'S TREATMENT: 10/27/22 -NUSTEP for LE muscle strength and cardiovascular response  LEVEL 1 for 2 min Leval 3 for 1 min  Level 5 for 1 min Level 1 min 1 min  Level 5 for 1 min Level 1 for 2 min cool down    Vitals assessed  throughout NUSTEP activity: -78bpm, 94% SpO2 on Room air- at 4 min- RPE= 3/10 -82 bpm; 95% SpO2 on Room air- at 8 min- RPE = 4/10  Circuit style therex:  Seated hip march Seated knee ext Ambulation x 60 feet (holding onto quad cane)   2 rounds of above- 12 reps each (RPE after 1st round= 4/10 and 5/10 after second)   Standing side step up/over green therapad x 12 reps Sit to stand x 10 reps without UE support. Ambulation x 60  feet  (holding onto quad cane) RPE= 5/10     Pt educated throughout session about proper posture and technique with exercises. Improved exercise technique, movement at target joints, use of target muscles after min to mod verbal, visual, tactile cues.   PATIENT EDUCATION: Education details: Goals, plan, exercise technique, body mechanics, energy conservation techniques  Person educated: Patient Education method: Explanation, Demonstration, Tactile cues, Verbal cues, and Handouts Education comprehension: verbalized understanding, returned demonstration, verbal cues required, and tactile cues required   HOME EXERCISE PROGRAM:  No Updates today  Access Code: [email or web address] URL: [email or web address] Date: 03/01/2022 Prepared by: Janna Arch  Exercises - Seated March  - 1 x daily - 7 x weekly - 2 sets - 10 reps - 5 hold - Seated Long Arc Quad  - 1 x daily - 7 x weekly - 2 sets - 10 reps - 5 hold - Seated Hip Abduction  - 1 x daily - 7 x weekly - 2 sets - 10 reps - 5 hold - Seated Hip Adduction Isometrics with Ball  - 1 x daily - 7 x weekly - 2 sets - 10 reps - 5 hold - Seated Gluteal Sets  - 1 x daily - 7 x weekly - 2 sets - 10 reps - 5 hold - Seated Heel Toe Raises  - 1 x daily - 7 x weekly - 2 sets - 10 reps - 5 hold LE strength and balance         PT Short Term Goals       PT SHORT TERM GOAL #1   Title Pt will be independent with initial HEP in order to improve strength and balance in order to decrease fall risk and improve function at home and work.    Baseline 02/23/2022- Patient reports not doing much as far as exercise in the home currently. 04/20/2022- Patient reports compliant with HEP and no questions at this time.; 9/7 pt indep   Time 6    Period Weeks    Status Goal met   Target Date 04/06/22              PT Long Term Goals       PT LONG TERM GOAL #1   Title Pt will be independent with final HEP in order to improve strength and balance in order  to decrease fall risk and improve function at home and work. 04/20/2022- Patient reports compliant with HEP and no questions at this time.    Baseline 02/23/2022= No formal HEP in place. 9/7: pt performing at least once a day, feels comfortable   Time 12    Period Weeks    Status  GOAL MET   Target Date 08/11/2022      PT LONG TERM GOAL #2   Title Pt will decrease 5TSTS by at least 8 seconds in order to demonstrate clinically significant improvement in LE strength.    Baseline 02/23/2022= 31.52 sec with B hands on knees; 04/20/2022= 16.08 sec with B hands on knees   Time 12    Period Days    Status Goal Met   Target Date 05/18/22      PT LONG TERM GOAL #3   Title Pt will increase 10MWT by at least 0.23 m/s in order to demonstrate clinically significant improvement in community ambulation.    Baseline 02/23/2022= 0.39 m/s using RW; 0.45 m/s; 9/7: 0.37 m/s with QC, 0.74 m/s with RW   Time 12    Period Weeks    Status GOAL MET   Target Date 05/18/22      PT LONG TERM GOAL #4   Title Pt will improve FOTO to target score of 50 to display perceived improvements in ability to complete ADL's.    Baseline 02/23/2022= 47; 9/7: 49; 11/30=54%   Time 12    Period Weeks    Status GOAL MET   Target Date 08/11/2022      PT LONG TERM GOAL #5   Title Pt will decrease TUG to below 20 seconds/decrease in order to demonstrate decreased fall risk.    Baseline 02/23/2022=28.27 sec with RW; 04/20/2022= 20.22 with SBQC; 9/7: 14.4 sec with RW, 18.5 sec with SBQC   Time 12    Period Weeks    Status MET   Target Date 05/18/22    PT LONG TERM GOAL #6  Title Pt will decrease TUG to 14 sec or below with SBQC in order to demonstrate decreased fall risk.   Baseline  9/7: 14.4 sec with RW, 18.5 sec with SBQC; 11/30=16.85 sec without an AD. 09/15/2022= 22.14 sec with SPC  Time 12   Period Weeks   Status ONGOING  Target Date 11/05/2022   PT LONG TERM GOAL #7  Title Patient will increase six minute walk test distance to  >700 ft for improved gait ability and increased ease with community participation.  Baseline 9/7: completes 4 minutes and 444 ft with RW. Test discontinued at 4 minutes due to [place]. 08/11/2022= patient ambulated 200 feet yet required 1 seated rest break- due to BLE fatigue- using SBQC: 09/15/2022= Patient ambulated 300 feet in 4 min 20 sec with 1 seated rest break using   Time 12   Period Weeks   Status ONGOING  Target Date 11/03/2022   PT LONG TERM GOAL #8  Title Pt will decrease 5TSTS by at least 2 seconds (without UE support)  in order to demonstrate clinically significant improvement in LE strength.   Baseline 08/11/2022= 16.33 sec with B hands on knees; 09/15/2022= 22 sec with B hands on knees (patient reports increased weakness since around Christmas and just has not bounced back)   Time 12   Period Weeks  Status ONGOING  Target Date 11/03/2022            Plan -     Clinical Impression Statement Continued with current plan of care for LE strengthening and improved functional mobility. Patient responded well to more circuit style workout - using the RPE scale appropriately and guiding exertion level and how to proceed. He was able to recover quickly and proceed to next exercise well without over exerting himself.  Pt will continue to benefit  from skilled PT services to address deficits and impairment identified in evaluation in order to maximize independence and safety in basic mobility required for performance of ADL, IADL, and leisure.     Personal Factors and Comorbidities Age;Past/Current Experience;Time since onset of injury/illness/exacerbation;Comorbidity 3+    Comorbidities COPD, History of Cancer, myasthenia gravis, chronic lumbar surgical history    Examination-Activity Limitations Lift;Squat;Bend;Stairs;Stand;Transfers;Insurance claims handler;Bathing;Hygiene/Grooming;Dressing;Toileting;Bed Mobility;Caring for Others;Continence    Examination-Participation  Restrictions Yard Work;Church;Cleaning;Driving;Community Activity    Stability/Clinical Decision Making Evolving/Moderate complexity    Rehab Potential Good    Clinical Impairments Affecting Rehab Potential multipe comorbidities    PT Frequency 2x / week    PT Duration 12 weeks    PT Treatment/Interventions ADLs/Self Care Home Management;Electrical Stimulation;Moist Heat;Traction;Ultrasound;Gait training;Stair training;Functional mobility training;Therapeutic activities;Therapeutic exercise;Balance training;Neuromuscular re-education;Patient/family education;Manual techniques;Passive range of motion;Dry needling;Joint Manipulations;Cryotherapy;Vestibular;Canalith Repostioning    PT Next Visit Plan standing strengthening and balance, seated strengthening and coordination    PT Home Exercise Plan Progress LE strengthen and functional endurance, continue plan   Consulted and Agree with Plan of Care Patient             4:31 PM, 10/27/22 Ollen Bowl, PT Physical Therapist - [place] [phone]     10/27/22, 4:31 PM

## 2022-11-01 ENCOUNTER — Ambulatory Visit: Payer: Medicare Other

## 2022-11-01 DIAGNOSIS — R262 Difficulty in walking, not elsewhere classified: Secondary | ICD-10-CM

## 2022-11-01 DIAGNOSIS — R2689 Other abnormalities of gait and mobility: Secondary | ICD-10-CM

## 2022-11-01 DIAGNOSIS — M6281 Muscle weakness (generalized): Secondary | ICD-10-CM | POA: Diagnosis not present

## 2022-11-01 DIAGNOSIS — R278 Other lack of coordination: Secondary | ICD-10-CM

## 2022-11-01 DIAGNOSIS — G8929 Other chronic pain: Secondary | ICD-10-CM

## 2022-11-01 DIAGNOSIS — R296 Repeated falls: Secondary | ICD-10-CM

## 2022-11-01 DIAGNOSIS — R2681 Unsteadiness on feet: Secondary | ICD-10-CM

## 2022-11-01 NOTE — Therapy (Signed)
OUTPATIENT PHYSICAL THERAPY TREATMENT NOTE     Patient Name: Francisco [name] MRN: [id number] DOB:[birth year], 77 y.o., male Today's Date: 11/01/2022  PCP: Adin Hector, MD REFERRING PROVIDER: Meade Maw MD   PT End of Session - 11/01/22 1517     Visit Number 7    Number of Visits 100    Date for PT Re-Evaluation 11/03/22    Authorization Type [employer] Medicare    Authorization Time Period 08/11/22-11/03/2022    Progress Note Due on Visit 38    PT Start Time 1512    PT Stop Time 1557    PT Time Calculation (min) 45 min    Equipment Utilized During Treatment Gait belt    Activity Tolerance Patient tolerated treatment well;No increased pain    Behavior During Therapy WFL for tasks assessed/performed                   Past Medical History:  Diagnosis Date   Arthritis    lower left hip   Atypical angina    Bilateral hand numbness    from back surgery   Bronchitis, chronic (HCC)    Cancer ([place])    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; Merkel cell cancer, and Basal cell cancer (twice; 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   Carotid stenosis    CHF (congestive heart failure) (HCC)    CKD (chronic kidney disease)    CKD (chronic kidney disease) stage 3, GFR 30-59 ml/min (HCC)    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   DDD (degenerative disc disease), cervical    Dysrhythmia    post carotid stent bradycardia; PAF 09/2020   GERD (gastroesophageal reflux disease)    Hypercholesterolemia    Hypertension    Hypothyroidism    pt takes Levothyroxine daily   Lumbosacral spinal stenosis    Myasthenia gravis, adult form ([place])    PAD (peripheral artery disease) (HCC)    Shortness of breath    Lung MD- Dr Darlin Coco   Sleep apnea    do not use CPAP every night   Past Surgical History:  Procedure Laterality Date   ANTERIOR CERVICAL DECOMP/DISCECTOMY FUSION  07/18/2011   Procedure: ANTERIOR CERVICAL DECOMPRESSION/DISCECTOMY FUSION 2 LEVELS;  Surgeon: Cooper Render  Pool;  Location: [facility];  Service: Neurosurgery;  Laterality: N/A;  cervical five-six, cervical six-seven anterior cervical discectomy and fusion   BACK SURGERY     in [place]     01/2020 Right, 04/2020 Left   CARDIAC CATHETERIZATION     2005 at [facility], no stents   CAROTID PTA/STENT INTERVENTION N/A 09/17/2020   Procedure: CAROTID PTA/STENT INTERVENTION;  Surgeon: Algernon Huxley, MD;  Location: [facility];  Service: Cardiovascular;  Laterality: N/A;   CATARACT EXTRACTION W/PHACO Left 01/06/2020   Procedure: CATARACT EXTRACTION PHACO AND INTRAOCULAR LENS PLACEMENT (IOC) ISTENT INJ LEFT 3.81  00:33.3;  Surgeon: Eulogio Bear, MD;  Location: [facility];  Service: Ophthalmology;  Laterality: Left;   CATARACT EXTRACTION W/PHACO Right 02/03/2020   Procedure: CATARACT EXTRACTION PHACO AND INTRAOCULAR LENS PLACEMENT ([place]) RIGHT ISTENT INJ;  Surgeon: Eulogio Bear, MD;  Location: [facility];  Service: Ophthalmology;  Laterality: Right;  4.29 0:35.6   COLONOSCOPY     HERNIA REPAIR Left    inguinal hernia repair in [place] MICRODISCECTOMY Left 02/24/2014   Procedure: LUMBAR LAMINECTOMY/DECOMPRESSION MICRODISCECTOMY LUMBAR THREE-FOUR, FOUR-FIVE, LEFT FIVE-SACRAL ONE ;  Surgeon: Charlie Pitter, MD;  Location: [facility];  Service: Neurosurgery;  Laterality: Left;  LUMBAR LAMINECTOMY/DECOMPRESSION MICRODISCECTOMY LUMBAR THREE-FOUR, FOUR-FIVE, LEFT FIVE-SACRAL ONE    LUMBAR LAMINECTOMY/DECOMPRESSION MICRODISCECTOMY N/A 05/03/2021   Procedure: Laminectomy and Foraminotomy - L2-L3;  Surgeon: Earnie Larsson, MD;  Location: [facility];  Service: Neurosurgery;  Laterality: N/A;  3C   POSTERIOR CERVICAL FUSION/FORAMINOTOMY N/A 08/07/2020   Procedure: C3-6 POSTERIOR FUSION WITH DECOMPRESSION;  Surgeon: Meade Maw, MD;  Location: [facility];  Service: Neurosurgery;  Laterality: N/A;   PROSTATECTOMY  8/14   [facility]  Dr Mare Ferrari    Patient Active Problem List   Diagnosis Date Noted   SVT (supraventricular tachycardia) 12/30/2021   Hypothyroidism 12/30/2021   Parkinson's disease 12/30/2021   Elevated troponin 12/30/2021   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   Acquired thrombophilia ([place]) 01/13/2021   History of decompression of median nerve 01/01/2021   Paroxysmal atrial fibrillation ([place]) 10/06/2020   Carotid stenosis, right 09/17/2020   S/P cervical spinal fusion    Leukocytosis    Essential hypertension    Anemia of chronic disease    Postoperative pain    Neuropathic pain    Cervical myelopathy ([place]) 08/07/2020   Preop cardiovascular exam 07/17/2020   SOB (shortness of breath) on exertion 07/17/2020   Leg weakness, bilateral 07/13/2020   Aortic atherosclerosis ([place]) 07/09/2020   Body mass index (BMI) 34.0-34.9, adult 12/25/2019   Myalgia 10/30/2019   Lumbar post-laminectomy syndrome 10/24/2019   PAD (peripheral artery disease) ([place]) 06/06/2019   CKD (chronic kidney disease) stage 3, GFR 30-59 ml/min (HCC) 04/29/2019   B12 deficiency 01/23/2019   Left arm numbness 02/28/2018   Neck pain 02/28/2018   Anemia 02/02/2017   DDD (degenerative disc disease), cervical 02/02/2017   Hypothyroid 02/02/2017   MRSA (methicillin resistant staph aureus) culture positive 02/02/2017   Nocturnal hypoxia 02/02/2017   Senile purpura ([place]) 10/03/2016   Essential hypertension, benign 09/16/2016   Bilateral carotid artery disease ([place]) 09/16/2016   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/18/2016   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   History of prostate cancer 12/21/2015   Left carpal tunnel syndrome 11/11/2015   Kidney stone on left side 07/05/2015   Myasthenia gravis ([place]) 05/26/2015   Radiculitis 01/05/2015   Long-term use of high-risk medication 10/15/2014   Persistent cough 09/10/2014   Pure hypercholesterolemia 07/25/2014   Spinal stenosis, lumbar region, with  neurogenic claudication 02/24/2014   Lumbosacral stenosis with neurogenic claudication 02/24/2014   COPD (chronic obstructive pulmonary disease) ([place]) 01/22/2014    REFERRING DIAG: balance disorder   THERAPY DIAG:  Difficulty in walking, not elsewhere classified  Muscle weakness (generalized)  Unsteadiness on feet  Other abnormalities of gait and mobility  Other lack of coordination  Chronic bilateral low back pain, unspecified whether sciatica present  Repeated falls  Rationale for Evaluation and Treatment Rehabilitation  PERTINENT HISTORY: Francisco [name] is a 77 year old male referred to OPPT neuro for difficulty with balance. He was also just recently dx in May 2023 with Impingement syndrome of left shoulder (M75.42) Impingement syndrome of left shoulder (primary encounter diagnosis) Left shoulder pain, unspecified chronicity Nontraumatic rupture of left proximal biceps tendon Past medical hx includes Lumbar laminectomy 04/2021. C3-6 decompression, fusion on 11/26, myasthenia gravis, 2012 ACDF 5/6, 6/7; OSA, hypothyroidism, HTN, COPD, multiple back surgeries, 2021 carpal tunnel release.  PRECAUTIONS: Fall  SUBJECTIVE:  Patient reports having a rough weekend with ongoing weakness and fatigue. States difficulty with walking and not  sure what he can do today.    PAIN:  Are you having pain? None reported  TODAY'S TREATMENT: 11/01/22  THEREX:   Pyramid LE Strengthening (Superset)   -Seated hip march 12 reps- no weight; 10 reps- 2#AW; 8 reps -2.5# AW; 6 reps with 3# AW  -calf raises-12 reps- no weight; 10 reps- 2#AW; 8 reps -2.5# AW; 6 reps with 3# AW  -LAQ- 12 reps- no weight; 10 reps- 2#AW; 8 reps -2.5# AW; 6 reps with 3# AW - each LE -Ham curl- use of disc (AROM) x 12 reps; yellow TB x 10 reps; Red TB x 8 reps; Green TB x 6 reps each LE  Sit to stand x 5 reps without UE- Patient performed at end of session and no VC for technique.   Pt educated throughout session  about proper posture and technique with exercises. Improved exercise technique, movement at target joints, use of target muscles after min to mod verbal, visual, tactile cues.   PATIENT EDUCATION: Education details: Goals, plan, exercise technique, body mechanics, energy conservation techniques  Person educated: Patient Education method: Explanation, Demonstration, Tactile cues, Verbal cues, and Handouts Education comprehension: verbalized understanding, returned demonstration, verbal cues required, and tactile cues required   HOME EXERCISE PROGRAM:  No Updates today  Access Code: [email or web address] URL: [email or web address] Date: 03/01/2022 Prepared by: Janna Arch  Exercises - Seated March  - 1 x daily - 7 x weekly - 2 sets - 10 reps - 5 hold - Seated Long Arc Quad  - 1 x daily - 7 x weekly - 2 sets - 10 reps - 5 hold - Seated Hip Abduction  - 1 x daily - 7 x weekly - 2 sets - 10 reps - 5 hold - Seated Hip Adduction Isometrics with Ball  - 1 x daily - 7 x weekly - 2 sets - 10 reps - 5 hold - Seated Gluteal Sets  - 1 x daily - 7 x weekly - 2 sets - 10 reps - 5 hold - Seated Heel Toe Raises  - 1 x daily - 7 x weekly - 2 sets - 10 reps - 5 hold LE strength and balance         PT Short Term Goals       PT SHORT TERM GOAL #1   Title Pt will be independent with initial HEP in order to improve strength and balance in order to decrease fall risk and improve function at home and work.    Baseline 02/23/2022- Patient reports not doing much as far as exercise in the home currently. 04/20/2022- Patient reports compliant with HEP and no questions at this time.; 9/7 pt indep   Time 6    Period Weeks    Status Goal met   Target Date 04/06/22              PT Long Term Goals       PT LONG TERM GOAL #1   Title Pt will be independent with final HEP in order to improve strength and balance in order to decrease fall risk and improve function at home and work. 04/20/2022- Patient  reports compliant with HEP and no questions at this time.    Baseline 02/23/2022= No formal HEP in place. 9/7: pt performing at least once a day, feels comfortable   Time 12    Period Weeks    Status GOAL MET   Target Date 08/11/2022      PT LONG TERM GOAL #2  Title Pt will decrease 5TSTS by at least 8 seconds in order to demonstrate clinically significant improvement in LE strength.    Baseline 02/23/2022= 31.52 sec with B hands on knees; 04/20/2022= 16.08 sec with B hands on knees   Time 12    Period Days    Status Goal Met   Target Date 05/18/22      PT LONG TERM GOAL #3   Title Pt will increase 10MWT by at least 0.23 m/s in order to demonstrate clinically significant improvement in community ambulation.    Baseline 02/23/2022= 0.39 m/s using RW; 0.45 m/s; 9/7: 0.37 m/s with QC, 0.74 m/s with RW   Time 12    Period Weeks    Status GOAL MET   Target Date 05/18/22      PT LONG TERM GOAL #4   Title Pt will improve FOTO to target score of 50 to display perceived improvements in ability to complete ADL's.    Baseline 02/23/2022= 47; 9/7: 49; 11/30=54%   Time 12    Period Weeks    Status GOAL MET   Target Date 08/11/2022      PT LONG TERM GOAL #5   Title Pt will decrease TUG to below 20 seconds/decrease in order to demonstrate decreased fall risk.    Baseline 02/23/2022=28.27 sec with RW; 04/20/2022= 20.22 with SBQC; 9/7: 14.4 sec with RW, 18.5 sec with SBQC   Time 12    Period Weeks    Status MET   Target Date 05/18/22    PT LONG TERM GOAL #6  Title Pt will decrease TUG to 14 sec or below with SBQC in order to demonstrate decreased fall risk.   Baseline  9/7: 14.4 sec with RW, 18.5 sec with SBQC; 11/30=16.85 sec without an AD. 09/15/2022= 22.14 sec with SPC  Time 12   Period Weeks   Status ONGOING  Target Date 11/05/2022   PT LONG TERM GOAL #7  Title Patient will increase six minute walk test distance to >700 ft for improved gait ability and increased ease with community  participation.  Baseline 9/7: completes 4 minutes and 444 ft with RW. Test discontinued at 4 minutes due to [place]. 08/11/2022= patient ambulated 200 feet yet required 1 seated rest break- due to BLE fatigue- using SBQC: 09/15/2022= Patient ambulated 300 feet in 4 min 20 sec with 1 seated rest break using   Time 12   Period Weeks   Status ONGOING  Target Date 11/03/2022   PT LONG TERM GOAL #8  Title Pt will decrease 5TSTS by at least 2 seconds (without UE support)  in order to demonstrate clinically significant improvement in LE strength.   Baseline 08/11/2022= 16.33 sec with B hands on knees; 09/15/2022= 22 sec with B hands on knees (patient reports increased weakness since around Christmas and just has not bounced back)   Time 12   Period Weeks  Status ONGOING  Target Date 11/03/2022            Plan -     Clinical Impression Statement Treatment limited secondary to patient feeling fatigued and weak overall today. He was receptive to seated pyramid LE strengthening and able to progress with resistance with decreased reps for muscle strengthening well today. He was able to control his breathing well and only limited by fatigue. Pt will continue to benefit from skilled PT services to address deficits and impairment identified in evaluation in order to maximize independence and safety in basic mobility required for performance  of ADL, IADL, and leisure.     Personal Factors and Comorbidities Age;Past/Current Experience;Time since onset of injury/illness/exacerbation;Comorbidity 3+    Comorbidities COPD, History of Cancer, myasthenia gravis, chronic lumbar surgical history    Examination-Activity Limitations Lift;Squat;Bend;Stairs;Stand;Transfers;Insurance claims handler;Bathing;Hygiene/Grooming;Dressing;Toileting;Bed Mobility;Caring for Others;Continence    Examination-Participation Restrictions Yard Work;Church;Cleaning;Driving;Community Activity    Stability/Clinical Decision  Making Evolving/Moderate complexity    Rehab Potential Good    Clinical Impairments Affecting Rehab Potential multipe comorbidities    PT Frequency 2x / week    PT Duration 12 weeks    PT Treatment/Interventions ADLs/Self Care Home Management;Electrical Stimulation;Moist Heat;Traction;Ultrasound;Gait training;Stair training;Functional mobility training;Therapeutic activities;Therapeutic exercise;Balance training;Neuromuscular re-education;Patient/family education;Manual techniques;Passive range of motion;Dry needling;Joint Manipulations;Cryotherapy;Vestibular;Canalith Repostioning    PT Next Visit Plan standing strengthening and balance, seated strengthening and coordination    PT Home Exercise Plan Progress LE strengthen and functional endurance, continue plan   Consulted and Agree with Plan of Care Patient             4:24 PM, 11/01/22 Ollen Bowl, PT Physical Therapist - [place] [phone]     11/01/22, 4:24 PM

## 2022-11-03 ENCOUNTER — Ambulatory Visit: Payer: Medicare Other

## 2022-11-08 ENCOUNTER — Ambulatory Visit: Payer: Medicare Other

## 2022-11-08 DIAGNOSIS — R2681 Unsteadiness on feet: Secondary | ICD-10-CM

## 2022-11-08 DIAGNOSIS — M6281 Muscle weakness (generalized): Secondary | ICD-10-CM

## 2022-11-08 DIAGNOSIS — R278 Other lack of coordination: Secondary | ICD-10-CM

## 2022-11-08 DIAGNOSIS — R2689 Other abnormalities of gait and mobility: Secondary | ICD-10-CM

## 2022-11-08 DIAGNOSIS — G8929 Other chronic pain: Secondary | ICD-10-CM

## 2022-11-08 DIAGNOSIS — R262 Difficulty in walking, not elsewhere classified: Secondary | ICD-10-CM

## 2022-11-08 NOTE — Therapy (Signed)
OUTPATIENT PHYSICAL THERAPY TREATMENT NOTE/RECERTIFICATION/Physical Therapy Progress Note   Dates of reporting period  09/15/2022   to   11/08/2022     Patient Name: Francisco [name] MRN: [id number] DOB:11-03-1945, 77 y.o., male Today's Date: 11/09/2022  PCP: Adin Hector, MD REFERRING PROVIDER: Meade Maw MD   PT End of Session - 11/08/22 1520     Visit Number 50    Number of Visits 78    Date for PT Re-Evaluation 01/31/23    Authorization Type [employer] Medicare    Authorization Time Period 08/11/22-11/03/2022    Progress Note Due on Visit 64    PT Start Time 1517    PT Stop Time 1557    PT Time Calculation (min) 40 min    Equipment Utilized During Treatment Gait belt    Activity Tolerance Patient tolerated treatment well;No increased pain    Behavior During Therapy WFL for tasks assessed/performed                    Past Medical History:  Diagnosis Date   Arthritis    lower left hip   Atypical angina    Bilateral hand numbness    from back surgery   Bronchitis, chronic (HCC)    Cancer ([place])    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; Merkel cell cancer, and Basal cell cancer (twice; 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   Carotid stenosis    CHF (congestive heart failure) (HCC)    CKD (chronic kidney disease)    CKD (chronic kidney disease) stage 3, GFR 30-59 ml/min (HCC)    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   DDD (degenerative disc disease), cervical    Dysrhythmia    post carotid stent bradycardia; PAF 09/2020   GERD (gastroesophageal reflux disease)    Hypercholesterolemia    Hypertension    Hypothyroidism    pt takes Levothyroxine daily   Lumbosacral spinal stenosis    Myasthenia gravis, adult form ([place])    PAD (peripheral artery disease) (HCC)    Shortness of breath    Lung MD- Dr Darlin Coco   Sleep apnea    do not use CPAP every night   Past Surgical History:  Procedure Laterality Date   ANTERIOR CERVICAL DECOMP/DISCECTOMY FUSION   07/18/2011   Procedure: ANTERIOR CERVICAL DECOMPRESSION/DISCECTOMY FUSION 2 LEVELS;  Surgeon: Cooper Render Pool;  Location: [facility];  Service: Neurosurgery;  Laterality: N/A;  cervical five-six, cervical six-seven anterior cervical discectomy and fusion   BACK SURGERY     in [place]     01/2020 Right, 04/2020 Left   CARDIAC CATHETERIZATION     2005 at [facility], no stents   CAROTID PTA/STENT INTERVENTION N/A 09/17/2020   Procedure: CAROTID PTA/STENT INTERVENTION;  Surgeon: Algernon Huxley, MD;  Location: [facility];  Service: Cardiovascular;  Laterality: N/A;   CATARACT EXTRACTION W/PHACO Left 01/06/2020   Procedure: CATARACT EXTRACTION PHACO AND INTRAOCULAR LENS PLACEMENT (IOC) ISTENT INJ LEFT 3.81  00:33.3;  Surgeon: Eulogio Bear, MD;  Location: [facility];  Service: Ophthalmology;  Laterality: Left;   CATARACT EXTRACTION W/PHACO Right 02/03/2020   Procedure: CATARACT EXTRACTION PHACO AND INTRAOCULAR LENS PLACEMENT ([place]) RIGHT ISTENT INJ;  Surgeon: Eulogio Bear, MD;  Location: [facility];  Service: Ophthalmology;  Laterality: Right;  4.29 0:35.6   COLONOSCOPY     HERNIA REPAIR Left    inguinal hernia repair in 1985  LUMBAR LAMINECTOMY/DECOMPRESSION MICRODISCECTOMY Left 02/24/2014   Procedure: LUMBAR LAMINECTOMY/DECOMPRESSION MICRODISCECTOMY LUMBAR THREE-FOUR, FOUR-FIVE, LEFT FIVE-SACRAL ONE ;  Surgeon: Charlie Pitter, MD;  Location: [facility];  Service: Neurosurgery;  Laterality: Left;  LUMBAR LAMINECTOMY/DECOMPRESSION MICRODISCECTOMY LUMBAR THREE-FOUR, FOUR-FIVE, LEFT FIVE-SACRAL ONE    LUMBAR LAMINECTOMY/DECOMPRESSION MICRODISCECTOMY N/A 05/03/2021   Procedure: Laminectomy and Foraminotomy - L2-L3;  Surgeon: Earnie Larsson, MD;  Location: [facility];  Service: Neurosurgery;  Laterality: N/A;  3C   POSTERIOR CERVICAL FUSION/FORAMINOTOMY N/A 08/07/2020   Procedure: C3-6 POSTERIOR FUSION WITH DECOMPRESSION;  Surgeon: Meade Maw, MD;  Location: [facility];  Service: Neurosurgery;  Laterality: N/A;   PROSTATECTOMY  8/14   [facility] Dr Mare Ferrari    Patient Active Problem List   Diagnosis Date Noted   SVT (supraventricular tachycardia) 12/30/2021   Hypothyroidism 12/30/2021   Parkinson's disease 12/30/2021   Elevated troponin 12/30/2021   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   Acquired thrombophilia ([place]) 01/13/2021   History of decompression of median nerve 01/01/2021   Paroxysmal atrial fibrillation ([place]) 10/06/2020   Carotid stenosis, right 09/17/2020   S/P cervical spinal fusion    Leukocytosis    Essential hypertension    Anemia of chronic disease    Postoperative pain    Neuropathic pain    Cervical myelopathy ([place]) 08/07/2020   Preop cardiovascular exam 07/17/2020   SOB (shortness of breath) on exertion 07/17/2020   Leg weakness, bilateral 07/13/2020   Aortic atherosclerosis ([place]) 07/09/2020   Body mass index (BMI) 34.0-34.9, adult 12/25/2019   Myalgia 10/30/2019   Lumbar post-laminectomy syndrome 10/24/2019   PAD (peripheral artery disease) ([place]) 06/06/2019   CKD (chronic kidney disease) stage 3, GFR 30-59 ml/min (HCC) 04/29/2019   B12 deficiency 01/23/2019   Left arm numbness 02/28/2018   Neck pain 02/28/2018   Anemia 02/02/2017   DDD (degenerative disc disease), cervical 02/02/2017   Hypothyroid 02/02/2017   MRSA (methicillin resistant staph aureus) culture positive 02/02/2017   Nocturnal hypoxia 02/02/2017   Senile purpura ([place]) 10/03/2016   Essential hypertension, benign 09/16/2016   Bilateral carotid artery disease ([place]) 09/16/2016   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/18/2016   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   History of prostate cancer 12/21/2015   Left carpal tunnel syndrome 11/11/2015   Kidney stone on left side 07/05/2015   Myasthenia gravis ([place]) 05/26/2015   Radiculitis 01/05/2015   Long-term use of high-risk medication 10/15/2014   Persistent  cough 09/10/2014   Pure hypercholesterolemia 07/25/2014   Spinal stenosis, lumbar region, with neurogenic claudication 02/24/2014   Lumbosacral stenosis with neurogenic claudication 02/24/2014   COPD (chronic obstructive pulmonary disease) ([place]) 01/22/2014    REFERRING DIAG: balance disorder   THERAPY DIAG:  Difficulty in walking, not elsewhere classified  Muscle weakness (generalized)  Unsteadiness on feet  Other abnormalities of gait and mobility  Chronic bilateral low back pain, unspecified whether sciatica present  Other lack of coordination  Rationale for Evaluation and Treatment Rehabilitation  PERTINENT HISTORY: Prather Linenberger is a 77 year old male referred to [facility] neuro for difficulty with balance. He was also just recently dx in May 2023 with Impingement syndrome of left shoulder (M75.42) Impingement syndrome of left shoulder (primary encounter diagnosis) Left shoulder pain, unspecified chronicity Nontraumatic rupture of left proximal biceps tendon Past medical hx includes Lumbar laminectomy 04/2021. C3-6 decompression, fusion on 11/26, myasthenia gravis, 2012 ACDF 5/6, 6/7; OSA, hypothyroidism, HTN, COPD, multiple back surgeries, 2021 carpal tunnel release.  PRECAUTIONS: Fall  SUBJECTIVE:  Patient  reports having a rough week and states feeling weak with difficulty walking. Denies any falls.    PAIN:  Are you having pain? None reported  TODAY'S TREATMENT: 11/09/22   Reassessed goals for Recert visit- See goals for today's details.    Pt educated throughout session about proper posture and technique with exercises. Improved exercise technique, movement at target joints, use of target muscles after min to mod verbal, visual, tactile cues.   PATIENT EDUCATION: Education details: Goals, plan, exercise technique, body mechanics, energy conservation techniques  Person educated: Patient Education method: Explanation, Demonstration, Tactile cues, Verbal cues, and  Handouts Education comprehension: verbalized understanding, returned demonstration, verbal cues required, and tactile cues required   HOME EXERCISE PROGRAM:  No Updates today  Access Code: [email or web address] URL: [email or web address] Date: 03/01/2022 Prepared by: Janna Arch  Exercises - Seated March  - 1 x daily - 7 x weekly - 2 sets - 10 reps - 5 hold - Seated Long Arc Quad  - 1 x daily - 7 x weekly - 2 sets - 10 reps - 5 hold - Seated Hip Abduction  - 1 x daily - 7 x weekly - 2 sets - 10 reps - 5 hold - Seated Hip Adduction Isometrics with Ball  - 1 x daily - 7 x weekly - 2 sets - 10 reps - 5 hold - Seated Gluteal Sets  - 1 x daily - 7 x weekly - 2 sets - 10 reps - 5 hold - Seated Heel Toe Raises  - 1 x daily - 7 x weekly - 2 sets - 10 reps - 5 hold LE strength and balance         PT Short Term Goals       PT SHORT TERM GOAL #1   Title Pt will be independent with initial HEP in order to improve strength and balance in order to decrease fall risk and improve function at home and work.    Baseline 02/23/2022- Patient reports not doing much as far as exercise in the home currently. 04/20/2022- Patient reports compliant with HEP and no questions at this time.; 9/7 pt indep   Time 6    Period Weeks    Status Goal met   Target Date 04/06/22              PT Long Term Goals       PT LONG TERM GOAL #1   Title Pt will be independent with final HEP in order to improve strength and balance in order to decrease fall risk and improve function at home and work. 04/20/2022- Patient reports compliant with HEP and no questions at this time.    Baseline 02/23/2022= No formal HEP in place. 9/7: pt performing at least once a day, feels comfortable   Time 12    Period Weeks    Status GOAL MET   Target Date 08/11/2022      PT LONG TERM GOAL #2   Title Pt will decrease 5TSTS by at least 8 seconds in order to demonstrate clinically significant improvement in LE strength.    Baseline  02/23/2022= 31.52 sec with B hands on knees; 04/20/2022= 16.08 sec with B hands on knees   Time 12    Period Days    Status Goal Met   Target Date 05/18/22      PT LONG TERM GOAL #3   Title Pt will increase 10MWT by at least 0.23 m/s in order to demonstrate clinically significant improvement in  community ambulation.    Baseline 02/23/2022= 0.39 m/s using RW; 0.45 m/s; 9/7: 0.37 m/s with QC, 0.74 m/s with RW   Time 12    Period Weeks    Status GOAL MET   Target Date 05/18/22      PT LONG TERM GOAL #4   Title Pt will improve FOTO to target score of 50 to display perceived improvements in ability to complete ADL's.    Baseline 02/23/2022= 47; 9/7: 49; 11/30=54%   Time 12    Period Weeks    Status GOAL MET   Target Date 08/11/2022      PT LONG TERM GOAL #5   Title Pt will decrease TUG to below 20 seconds/decrease in order to demonstrate decreased fall risk.    Baseline 02/23/2022=28.27 sec with RW; 04/20/2022= 20.22 with SBQC; 9/7: 14.4 sec with RW, 18.5 sec with SBQC   Time 12    Period Weeks    Status MET   Target Date 05/18/22    PT LONG TERM GOAL #6  Title Pt will decrease TUG to 14 sec or below with SBQC in order to demonstrate decreased fall risk.   Baseline  9/7: 14.4 sec with RW, 18.5 sec with SBQC; 11/30=16.85 sec without an AD. 09/15/2022= 22.14 sec with SPC; 11/08/2022= 25.17 sec without and AD and 21.22 sec with SPC  Time 12   Period Weeks   Status ONGOING  Target Date 01/31/2023   PT LONG TERM GOAL #7  Title Patient will increase six minute walk test distance to >700 ft for improved gait ability and increased ease with community participation.  Baseline 9/7: completes 4 minutes and 444 ft with RW. Test discontinued at 4 minutes due to [place]. 08/11/2022= patient ambulated 200 feet yet required 1 seated rest break- due to BLE fatigue- using SBQC: 09/15/2022= Patient ambulated 300 feet in 4 min 20 sec with 1 seated rest break using SBQC. 11/08/2022 = 175 feet- stopped due to  exhausted today- 2 min 10 sec  Time 12   Period Weeks   Status ONGOING  Target Date 01/31/2023   PT LONG TERM GOAL #8  Title Pt will decrease 5TSTS by at least 2 seconds (without UE support)  in order to demonstrate clinically significant improvement in LE strength.   Baseline 08/11/2022= 16.33 sec with B hands on knees; 09/15/2022= 22 sec with B hands on knees (patient reports increased weakness since around Christmas and just has not bounced back); 11/08/2022= 18.85 without UE Support  Time 12   Period Weeks  Status ONGOING  Target Date 01/31/2023       PT LONG TERM GOAL #  Title Pt will improve BERG by at least 3 points in order to demonstrate clinically significant improvement in balance.   Baseline 11/08/2022= 33/56  Time 12   Period Weeks  Status NEW  Target Date 01/31/2023       Plan -     Clinical Impression Statement Patient presents with good motivation for recert visit. Patient was not feeling well but agreeable to try his best. He was able to demo some progress despite not feeling well today- Improved TUG and 5xSTS test indicating some improvement. Added a balance goal to continue to focus on optimal functional mobility with decreased fall risk. He continues to be very limited overall with endurance and may benefit from adding walker for longer duration ambulation for improved community distances.  Patient's condition has the potential to improve in response to therapy. Maximum improvement is  yet to be obtained. The anticipated improvement is attainable and reasonable in a generally predictable time. Pt will continue to benefit from skilled PT services to address deficits and impairment identified in evaluation in order to maximize independence and safety in basic mobility required for performance of ADL, IADL, and leisure.     Personal Factors and Comorbidities Age;Past/Current Experience;Time since onset of injury/illness/exacerbation;Comorbidity 3+    Comorbidities COPD,  History of Cancer, myasthenia gravis, chronic lumbar surgical history    Examination-Activity Limitations Lift;Squat;Bend;Stairs;Stand;Transfers;Insurance claims handler;Bathing;Hygiene/Grooming;Dressing;Toileting;Bed Mobility;Caring for Others;Continence    Examination-Participation Restrictions Yard Work;Church;Cleaning;Driving;Community Activity    Stability/Clinical Decision Making Evolving/Moderate complexity    Rehab Potential Good    Clinical Impairments Affecting Rehab Potential multipe comorbidities    PT Frequency 1-2x / week    PT Duration 12 weeks    PT Treatment/Interventions ADLs/Self Care Home Management;Electrical Stimulation;Moist Heat;Traction;Ultrasound;Gait training;Stair training;Functional mobility training;Therapeutic activities;Therapeutic exercise;Balance training;Neuromuscular re-education;Patient/family education;Manual techniques;Passive range of motion;Dry needling;Joint Manipulations;Cryotherapy;Vestibular;Canalith Repostioning    PT Next Visit Plan standing strengthening and balance, seated strengthening and coordination    PT Home Exercise Plan Progress LE strengthening, balance training,  and functional endurance   Consulted and Agree with Plan of Care Patient             5:16 PM, 11/09/22 Ollen Bowl, PT Physical Therapist - [place] [phone]     11/09/22, 5:16 PM

## 2022-11-10 ENCOUNTER — Ambulatory Visit: Payer: Medicare Other

## 2022-11-10 NOTE — Therapy (Deleted)
OUTPATIENT PHYSICAL THERAPY TREATMENT NOTE/RECERTIFICATION/Physical Therapy Progress Note   Dates of reporting period  09/15/2022   to   11/08/2022     Patient Name: Francisco [name] MRN: [id number] DOB:[birth year], 77 y.o., male Today's Date: 11/10/2022  PCP: Francisco Hector, [name] REFERRING PROVIDER: Meade Maw [name]            Past Medical History:  Diagnosis Date   Arthritis    lower left hip   Atypical angina    Bilateral hand numbness    from back surgery   Bronchitis, chronic ([place])    Cancer ([place])    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; Merkel cell cancer, and Basal cell cancer (twice; 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   Carotid stenosis    CHF (congestive heart failure) (HCC)    CKD (chronic kidney disease)    CKD (chronic kidney disease) stage 3, GFR 30-59 ml/min (HCC)    COPD (chronic obstructive pulmonary disease) ([place])    stage 2   DDD (degenerative disc disease), cervical    Dysrhythmia    post carotid stent bradycardia; PAF 09/2020   GERD (gastroesophageal reflux disease)    Hypercholesterolemia    Hypertension    Hypothyroidism    pt takes Levothyroxine daily   Lumbosacral spinal stenosis    Myasthenia gravis, adult form ([place])    PAD (peripheral artery disease) ([place])    Shortness of breath    Lung [name]- Dr Francisco [name]   Sleep apnea    do not use CPAP every night   Past Surgical History:  Procedure Laterality Date   ANTERIOR CERVICAL DECOMP/DISCECTOMY FUSION  07/18/2011   Procedure: ANTERIOR CERVICAL DECOMPRESSION/DISCECTOMY FUSION 2 LEVELS;  Surgeon: Francisco [name];  Location: [facility];  Service: Neurosurgery;  Laterality: N/A;  cervical five-six, cervical six-seven anterior cervical discectomy and fusion   BACK SURGERY     in [place]     01/2020 Right, 04/2020 Left   CARDIAC CATHETERIZATION     2005 at [facility], no stents   CAROTID PTA/STENT INTERVENTION N/A 09/17/2020   Procedure: CAROTID PTA/STENT INTERVENTION;   Surgeon: Francisco Huxley, [name];  Location: [facility];  Service: Cardiovascular;  Laterality: N/A;   CATARACT EXTRACTION W/PHACO Left 01/06/2020   Procedure: CATARACT EXTRACTION PHACO AND INTRAOCULAR LENS PLACEMENT (IOC) ISTENT INJ LEFT 3.81  00:33.3;  Surgeon: Francisco Bear, [name];  Location: [facility];  Service: Ophthalmology;  Laterality: Left;   CATARACT EXTRACTION W/PHACO Right 02/03/2020   Procedure: CATARACT EXTRACTION PHACO AND INTRAOCULAR LENS PLACEMENT ([place]) RIGHT ISTENT INJ;  Surgeon: Francisco Bear, [name];  Location: [facility];  Service: Ophthalmology;  Laterality: Right;  4.29 0:35.6   COLONOSCOPY     HERNIA REPAIR Left    inguinal hernia repair in 1985   LUMBAR LAMINECTOMY/DECOMPRESSION MICRODISCECTOMY Left 02/24/2014   Procedure: LUMBAR LAMINECTOMY/DECOMPRESSION MICRODISCECTOMY LUMBAR THREE-FOUR, FOUR-FIVE, LEFT FIVE-SACRAL ONE ;  Surgeon: Francisco Pitter, [name];  Location: [facility];  Service: Neurosurgery;  Laterality: Left;  LUMBAR LAMINECTOMY/DECOMPRESSION MICRODISCECTOMY LUMBAR THREE-FOUR, FOUR-FIVE, LEFT FIVE-SACRAL ONE    LUMBAR LAMINECTOMY/DECOMPRESSION MICRODISCECTOMY N/A 05/03/2021   Procedure: Laminectomy and Foraminotomy - L2-L3;  Surgeon: Francisco Larsson, [name];  Location: [facility];  Service: Neurosurgery;  Laterality: N/A;  3C   POSTERIOR CERVICAL FUSION/FORAMINOTOMY N/A 08/07/2020   Procedure: C3-6 POSTERIOR FUSION WITH DECOMPRESSION;  Surgeon: Francisco Maw, [name];  Location: [facility];  Service: Neurosurgery;  Laterality: N/A;   PROSTATECTOMY  8/14  [place] Dr Francisco [name]    Patient Active Problem List   Diagnosis Date Noted   SVT (supraventricular tachycardia) 12/30/2021   Hypothyroidism 12/30/2021   Parkinson's disease 12/30/2021   Elevated troponin 12/30/2021   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   Acquired thrombophilia ([place]) 01/13/2021   History of decompression of median nerve 01/01/2021   Paroxysmal atrial fibrillation  ([place]) 10/06/2020   Carotid stenosis, right 09/17/2020   S/P cervical spinal fusion    Leukocytosis    Essential hypertension    Anemia of chronic disease    Postoperative pain    Neuropathic pain    Cervical myelopathy ([place]) 08/07/2020   Preop cardiovascular exam 07/17/2020   SOB (shortness of breath) on exertion 07/17/2020   Leg weakness, bilateral 07/13/2020   Aortic atherosclerosis ([place]) 07/09/2020   Body mass index (BMI) 34.0-34.9, adult 12/25/2019   Myalgia 10/30/2019   Lumbar post-laminectomy syndrome 10/24/2019   PAD (peripheral artery disease) ([place]) 06/06/2019   CKD (chronic kidney disease) stage 3, GFR 30-59 ml/min (HCC) 04/29/2019   B12 deficiency 01/23/2019   Left arm numbness 02/28/2018   Neck pain 02/28/2018   Anemia 02/02/2017   DDD (degenerative disc disease), cervical 02/02/2017   Hypothyroid 02/02/2017   MRSA (methicillin resistant staph aureus) culture positive 02/02/2017   Nocturnal hypoxia 02/02/2017   Senile purpura ([place]) 10/03/2016   Essential hypertension, benign 09/16/2016   Bilateral carotid artery disease ([place]) 09/16/2016   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/18/2016   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  Rationale for Evaluation and Treatment Rehabilitation  PERTINENT HISTORY: Francisco [name] is a 77 year old male referred to [facility] neuro for difficulty with balance. He  was also just recently dx in May 2023 with Impingement syndrome of left shoulder (Francisco [name]) Impingement syndrome of left shoulder (primary encounter diagnosis) Left shoulder pain, unspecified chronicity Nontraumatic rupture of left proximal biceps tendon Past medical hx includes Lumbar laminectomy 04/2021. C3-6 decompression, fusion on 11/26, myasthenia gravis, 2012 ACDF 5/6, 6/7; OSA, hypothyroidism, HTN, COPD, multiple back surgeries, 2021 carpal tunnel release.  PRECAUTIONS: Fall  SUBJECTIVE:  Patient reports having a rough week and states feeling weak with difficulty walking. Denies any falls.    PAIN:  Are you having pain? None reported  TODAY'S TREATMENT: 11/10/22   Reassessed goals for Recert visit- See goals for today's details.    Pt educated throughout session about proper posture and technique with exercises. Improved exercise technique, movement at target joints, use of target muscles after min to mod verbal, visual, tactile cues.   PATIENT EDUCATION: Education details: Goals, plan, exercise technique, body mechanics, energy conservation techniques  Person educated: Patient Education method: Explanation, Demonstration, Tactile cues, Verbal cues, and Handouts Education comprehension: verbalized understanding, returned demonstration, verbal cues required, and tactile cues required   HOME EXERCISE PROGRAM:  No Updates today  Access Code: [email or web address] URL: [email or web address] Date: 03/01/2022 Prepared by: Janna Arch  Exercises - Seated March  - 1 x daily -  7 x weekly - 2 sets - 10 reps - 5 hold - Seated Long Arc Quad  - 1 x daily - 7 x weekly - 2 sets - 10 reps - 5 hold - Seated Hip Abduction  - 1 x daily - 7 x weekly - 2 sets - 10 reps - 5 hold - Seated Hip Adduction Isometrics with Ball  - 1 x daily - 7 x weekly - 2 sets - 10 reps - 5 hold - Seated Gluteal Sets  - 1 x daily - 7 x weekly - 2 sets - 10 reps - 5 hold - Seated Heel Toe Raises  - 1 x daily - 7  x weekly - 2 sets - 10 reps - 5 hold LE strength and balance         PT Short Term Goals       PT SHORT TERM GOAL #1   Title Pt will be independent with initial HEP in order to improve strength and balance in order to decrease fall risk and improve function at home and work.    Baseline 02/23/2022- Patient reports not doing much as far as exercise in the home currently. 04/20/2022- Patient reports compliant with HEP and no questions at this time.; 9/7 pt indep   Time 6    Period Weeks    Status Goal met   Target Date 04/06/22              PT Long Term Goals       PT LONG TERM GOAL #1   Title Pt will be independent with final HEP in order to improve strength and balance in order to decrease fall risk and improve function at home and work. 04/20/2022- Patient reports compliant with HEP and no questions at this time.    Baseline 02/23/2022= No formal HEP in place. 9/7: pt performing at least once a day, feels comfortable   Time 12    Period Weeks    Status GOAL MET   Target Date 08/11/2022      PT LONG TERM GOAL #2   Title Pt will decrease 5TSTS by at least 8 seconds in order to demonstrate clinically significant improvement in LE strength.    Baseline 02/23/2022= 31.52 sec with B hands on knees; 04/20/2022= 16.08 sec with B hands on knees   Time 12    Period Days    Status Goal Met   Target Date 05/18/22      PT LONG TERM GOAL #3   Title Pt will increase 10MWT by at least 0.23 m/s in order to demonstrate clinically significant improvement in community ambulation.    Baseline 02/23/2022= 0.39 m/s using RW; 0.45 m/s; 9/7: 0.37 m/s with QC, 0.74 m/s with RW   Time 12    Period Weeks    Status GOAL MET   Target Date 05/18/22      PT LONG TERM GOAL #4   Title Pt will improve FOTO to target score of 50 to display perceived improvements in ability to complete ADL's.    Baseline 02/23/2022= 47; 9/7: 49; 11/30=54%   Time 12    Period Weeks    Status GOAL MET   Target Date  08/11/2022      PT LONG TERM GOAL #5   Title Pt will decrease TUG to below 20 seconds/decrease in order to demonstrate decreased fall risk.    Baseline 02/23/2022=28.27 sec with RW; 04/20/2022= 20.22 with SBQC; 9/7: 14.4 sec with RW, 18.5 sec  with [facility]   Time 12    Period Weeks    Status MET   Target Date 05/18/22    PT LONG TERM GOAL #6  Title Pt will decrease TUG to 14 sec or below with SBQC in order to demonstrate decreased fall risk.   Baseline  9/7: 14.4 sec with RW, 18.5 sec with SBQC; 11/30=16.85 sec without an AD. 09/15/2022= 22.14 sec with SPC; 11/08/2022= 25.17 sec without and AD and 21.22 sec with SPC  Time 12   Period Weeks   Status ONGOING  Target Date 01/31/2023   PT LONG TERM GOAL #7  Title Patient will increase six minute walk test distance to >700 ft for improved gait ability and increased ease with community participation.  Baseline 9/7: completes 4 minutes and 444 ft with RW. Test discontinued at 4 minutes due to [place]. 08/11/2022= patient ambulated 200 feet yet required 1 seated rest break- due to BLE fatigue- using SBQC: 09/15/2022= Patient ambulated 300 feet in 4 min 20 sec with 1 seated rest break using SBQC. 11/08/2022 = 175 feet- stopped due to exhausted today- 2 min 10 sec  Time 12   Period Weeks   Status ONGOING  Target Date 01/31/2023   PT LONG TERM GOAL #8  Title Pt will decrease 5TSTS by at least 2 seconds (without UE support)  in order to demonstrate clinically significant improvement in LE strength.   Baseline 08/11/2022= 16.33 sec with B hands on knees; 09/15/2022= 22 sec with B hands on knees (patient reports increased weakness since around Christmas and just has not bounced back); 11/08/2022= 18.85 without UE Support  Time 12   Period Weeks  Status ONGOING  Target Date 01/31/2023       PT LONG TERM GOAL #  Title Pt will improve BERG by at least 3 points in order to demonstrate clinically significant improvement in balance.   Baseline 11/08/2022= 33/56  Time  12   Period Weeks  Status NEW  Target Date 01/31/2023       Plan -     Clinical Impression Statement Patient presents with good motivation for recert visit. Patient was not feeling well but agreeable to try his best. He was able to demo some progress despite not feeling well today- Improved TUG and 5xSTS test indicating some improvement. Added a balance goal to continue to focus on optimal functional mobility with decreased fall risk. He continues to be very limited overall with endurance and may benefit from adding walker for longer duration ambulation for improved community distances.  Patient's condition has the potential to improve in response to therapy. Maximum improvement is yet to be obtained. The anticipated improvement is attainable and reasonable in a generally predictable time. Pt will continue to benefit from skilled PT services to address deficits and impairment identified in evaluation in order to maximize independence and safety in basic mobility required for performance of ADL, IADL, and leisure.     Personal Factors and Comorbidities Age;Past/Current Experience;Time since onset of injury/illness/exacerbation;Comorbidity 3+    Comorbidities COPD, History of Cancer, myasthenia gravis, chronic lumbar surgical history    Examination-Activity Limitations Lift;Squat;Bend;Stairs;Stand;Transfers;Insurance claims handler;Bathing;Hygiene/Grooming;Dressing;Toileting;Bed Mobility;Caring for Others;Continence    Examination-Participation Restrictions Yard Work;Church;Cleaning;Driving;Community Activity    Stability/Clinical Decision Making Evolving/Moderate complexity    Rehab Potential Good    Clinical Impairments Affecting Rehab Potential multipe comorbidities    PT Frequency 1-2x / week    PT Duration 12 weeks    PT Treatment/Interventions ADLs/Self Care Home Management;Electrical Stimulation;Moist Heat;Traction;Ultrasound;Gait  training;Stair training;Functional mobility  training;Therapeutic activities;Therapeutic exercise;Balance training;Neuromuscular re-education;Patient/family education;Manual techniques;Passive range of motion;Dry needling;Joint Manipulations;Cryotherapy;Vestibular;Canalith Repostioning    PT Next Visit Plan standing strengthening and balance, seated strengthening and coordination    PT Home Exercise Plan Progress LE strengthening, balance training,  and functional endurance   Consulted and Agree with Plan of Care Patient             8:42 AM, 11/10/22 Ollen Bowl, PT Physical Therapist - [place] [phone]     11/10/22, 8:42 AM

## 2022-11-10 NOTE — Therapy (Incomplete)
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   Carotid stenosis    CHF (congestive heart failure) (HCC)    CKD (chronic kidney disease)    CKD (chronic kidney disease) stage 3, GFR 30-59 ml/min (HCC)    COPD (chronic obstructive pulmonary disease) ([place])    stage 2   DDD (degenerative disc disease), cervical    Dysrhythmia    post carotid stent bradycardia; PAF 09/2020   GERD (gastroesophageal reflux disease)    Hypercholesterolemia    Hypertension    Hypothyroidism    pt takes Levothyroxine daily   Lumbosacral spinal stenosis    Myasthenia gravis, adult form ([place])    PAD (peripheral artery disease) ([place])    Shortness of breath    Lung MD- Dr Darlin Coco   Sleep apnea    do not use CPAP every night   Past Surgical History:  Procedure Laterality Date   ANTERIOR CERVICAL DECOMP/DISCECTOMY FUSION  07/18/2011   Procedure: ANTERIOR CERVICAL DECOMPRESSION/DISCECTOMY FUSION 2 LEVELS;  Surgeon: Cooper Render Pool;  Location: [place] [facility];  Service: Neurosurgery;  Laterality: N/A;  cervical five-six, cervical six-seven anterior cervical discectomy and fusion   BACK SURGERY     in [place]     01/2020 Right, 04/2020 Left   CARDIAC CATHETERIZATION     2005 at [facility], no stents   CAROTID PTA/STENT INTERVENTION N/A 09/17/2020   Procedure: CAROTID PTA/STENT INTERVENTION;  Surgeon: Algernon Huxley, MD;  Location: [facility];  Service: Cardiovascular;  Laterality: N/A;    CATARACT EXTRACTION W/PHACO Left 01/06/2020   Procedure: CATARACT EXTRACTION PHACO AND INTRAOCULAR LENS PLACEMENT (IOC) ISTENT INJ LEFT 3.81  00:33.3;  Surgeon: Eulogio Bear, MD;  Location: [place];  Service: Ophthalmology;  Laterality: Left;   CATARACT EXTRACTION W/PHACO Right 02/03/2020   Procedure: CATARACT EXTRACTION PHACO AND INTRAOCULAR LENS PLACEMENT ([place]) RIGHT ISTENT INJ;  Surgeon: Eulogio Bear, MD;  Location: [facility];  Service: Ophthalmology;  Laterality: Right;  4.29 0:35.6   COLONOSCOPY     HERNIA REPAIR Left    inguinal hernia repair in 1985   LUMBAR LAMINECTOMY/DECOMPRESSION MICRODISCECTOMY Left 02/24/2014   Procedure: LUMBAR LAMINECTOMY/DECOMPRESSION MICRODISCECTOMY LUMBAR THREE-FOUR, FOUR-FIVE, LEFT FIVE-SACRAL ONE ;  Surgeon: Charlie Pitter, MD;  Location: [place];  Service: Neurosurgery;  Laterality: Left;  LUMBAR LAMINECTOMY/DECOMPRESSION MICRODISCECTOMY LUMBAR THREE-FOUR, FOUR-FIVE, LEFT FIVE-SACRAL ONE    LUMBAR LAMINECTOMY/DECOMPRESSION MICRODISCECTOMY N/A 05/03/2021   Procedure: Laminectomy and Foraminotomy - L2-L3;  Surgeon: Earnie Larsson, MD;  Location: [facility];  Service: Neurosurgery;  Laterality: N/A;  3C   POSTERIOR CERVICAL FUSION/FORAMINOTOMY N/A 08/07/2020   Procedure: C3-6 POSTERIOR FUSION WITH DECOMPRESSION;  Surgeon: Meade Maw, MD;  Location: [facility];  Service: Neurosurgery;  Laterality: N/A;   PROSTATECTOMY  8/14   [facility] Dr Mare Ferrari    Patient Active Problem List   Diagnosis Date Noted  SVT (supraventricular tachycardia) 12/30/2021   Hypothyroidism 12/30/2021   Parkinson's disease 12/30/2021   Elevated troponin 12/30/2021   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   Acquired thrombophilia ([place]) 01/13/2021   History of decompression of median nerve 01/01/2021   Paroxysmal atrial fibrillation ([place]) 10/06/2020   Carotid stenosis, right 09/17/2020   S/P cervical spinal fusion    Leukocytosis     Essential hypertension    Anemia of chronic disease    Postoperative pain    Neuropathic pain    Cervical myelopathy ([place]) 08/07/2020   Preop cardiovascular exam 07/17/2020   SOB (shortness of breath) on exertion 07/17/2020   Leg weakness, bilateral 07/13/2020   Aortic atherosclerosis ([place]) 07/09/2020   Body mass index (BMI) 34.0-34.9, adult 12/25/2019   Myalgia 10/30/2019   Lumbar post-laminectomy syndrome 10/24/2019   PAD (peripheral artery disease) ([place]) 06/06/2019   CKD (chronic kidney disease) stage 3, GFR 30-59 ml/min (HCC) 04/29/2019   B12 deficiency 01/23/2019   Left arm numbness 02/28/2018   Neck pain 02/28/2018   Anemia 02/02/2017   DDD (degenerative disc disease), cervical 02/02/2017   Hypothyroid 02/02/2017   MRSA (methicillin resistant staph aureus) culture positive 02/02/2017   Nocturnal hypoxia 02/02/2017   Senile purpura ([place]) 10/03/2016   Essential hypertension, benign 09/16/2016   Bilateral carotid artery disease ([place]) 09/16/2016   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/18/2016   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  Rationale for Evaluation and Treatment Rehabilitation  PERTINENT HISTORY: Francisco [name] is a 77 year old male referred to [facility] neuro for difficulty with balance. He was also just recently dx in May 2023 with Impingement syndrome of left shoulder (M75.42) Impingement  syndrome of left shoulder (primary encounter diagnosis) Left shoulder pain, unspecified chronicity Nontraumatic rupture of left proximal biceps tendon Past medical hx includes Lumbar laminectomy 04/2021. C3-6 decompression, fusion on 11/26, myasthenia gravis, 2012 ACDF 5/6, 6/7; OSA, hypothyroidism, HTN, COPD, multiple back surgeries, 2021 carpal tunnel release.  PRECAUTIONS: Fall  SUBJECTIVE:  ***   PAIN:  Are you having pain? None reported  TODAY'S TREATMENT: 11/10/22   Therex:   Standing postural - scap retraction Standing postural- Horizontal ABD Standing postural - shoulder ext  Sit to stand Lumbar flex to ext  Step over 1/2 foam (forward/retro)  Step tap onto 6" step Walking   Pt educated throughout session about proper posture and technique with exercises. Improved exercise technique, movement at target joints, use of target muscles after min to mod verbal, visual, tactile cues.   PATIENT EDUCATION: Education details: Goals, plan, exercise technique, body mechanics, energy conservation techniques  Person educated: Patient Education method: Explanation, Demonstration, Tactile cues, Verbal cues, and Handouts Education comprehension: verbalized understanding, returned demonstration, verbal cues required, and tactile cues required   HOME EXERCISE PROGRAM:  No Updates today  Access Code: [email or web address] URL: [email or web address] Date: 03/01/2022 Prepared by: Janna Arch  Exercises - Seated March  - 1 x daily - 7 x weekly - 2 sets -  10 reps - 5 hold - Seated Long Arc Quad  - 1 x daily - 7 x weekly - 2 sets - 10 reps - 5 hold - Seated Hip Abduction  - 1 x daily - 7 x weekly - 2 sets - 10 reps - 5 hold - Seated Hip Adduction Isometrics with Ball  - 1 x daily - 7 x weekly - 2 sets - 10 reps - 5 hold - Seated Gluteal Sets  - 1 x daily - 7 x weekly - 2 sets - 10 reps - 5 hold - Seated Heel Toe Raises  - 1 x daily - 7 x weekly - 2 sets - 10 reps - 5 hold LE  strength and balance         PT Short Term Goals       PT SHORT TERM GOAL #1   Title Pt will be independent with initial HEP in order to improve strength and balance in order to decrease fall risk and improve function at home and work.    Baseline 02/23/2022- Patient reports not doing much as far as exercise in the home currently. 04/20/2022- Patient reports compliant with HEP and no questions at this time.; 9/7 pt indep   Time 6    Period Weeks    Status Goal met   Target Date 04/06/22              PT Long Term Goals       PT LONG TERM GOAL #1   Title Pt will be independent with final HEP in order to improve strength and balance in order to decrease fall risk and improve function at home and work. 04/20/2022- Patient reports compliant with HEP and no questions at this time.    Baseline 02/23/2022= No formal HEP in place. 9/7: pt performing at least once a day, feels comfortable   Time 12    Period Weeks    Status GOAL MET   Target Date 08/11/2022      PT LONG TERM GOAL #2   Title Pt will decrease 5TSTS by at least 8 seconds in order to demonstrate clinically significant improvement in LE strength.    Baseline 02/23/2022= 31.52 sec with B hands on knees; 04/20/2022= 16.08 sec with B hands on knees   Time 12    Period Days    Status Goal Met   Target Date 05/18/22      PT LONG TERM GOAL #3   Title Pt will increase 10MWT by at least 0.23 m/s in order to demonstrate clinically significant improvement in community ambulation.    Baseline 02/23/2022= 0.39 m/s using RW; 0.45 m/s; 9/7: 0.37 m/s with QC, 0.74 m/s with RW   Time 12    Period Weeks    Status GOAL MET   Target Date 05/18/22      PT LONG TERM GOAL #4   Title Pt will improve FOTO to target score of 50 to display perceived improvements in ability to complete ADL's.    Baseline 02/23/2022= 47; 9/7: 49; 11/30=54%   Time 12    Period Weeks    Status GOAL MET   Target Date 08/11/2022      PT LONG TERM GOAL #5   Title  Pt will decrease TUG to below 20 seconds/decrease in order to demonstrate decreased fall risk.    Baseline 02/23/2022=28.27 sec with RW; 04/20/2022= 20.22 with SBQC; 9/7: 14.4 sec with RW, 18.5 sec with SBQC   Time 12  Period Weeks    Status MET   Target Date 05/18/22    PT LONG TERM GOAL #6  Title Pt will decrease TUG to 14 sec or below with SBQC in order to demonstrate decreased fall risk.   Baseline  9/7: 14.4 sec with RW, 18.5 sec with SBQC; 11/30=16.85 sec without an AD. 09/15/2022= 22.14 sec with SPC; 11/08/2022= 25.17 sec without and AD and 21.22 sec with SPC  Time 12   Period Weeks   Status ONGOING  Target Date 01/31/2023   PT LONG TERM GOAL #7  Title Patient will increase six minute walk test distance to >700 ft for improved gait ability and increased ease with community participation.  Baseline 9/7: completes 4 minutes and 444 ft with RW. Test discontinued at 4 minutes due to [place]. 08/11/2022= patient ambulated 200 feet yet required 1 seated rest break- due to BLE fatigue- using SBQC: 09/15/2022= Patient ambulated 300 feet in 4 min 20 sec with 1 seated rest break using SBQC. 11/08/2022 = 175 feet- stopped due to exhausted today- 2 min 10 sec  Time 12   Period Weeks   Status ONGOING  Target Date 01/31/2023   PT LONG TERM GOAL #8  Title Pt will decrease 5TSTS by at least 2 seconds (without UE support)  in order to demonstrate clinically significant improvement in LE strength.   Baseline 08/11/2022= 16.33 sec with B hands on knees; 09/15/2022= 22 sec with B hands on knees (patient reports increased weakness since around Christmas and just has not bounced back); 11/08/2022= 18.85 without UE Support  Time 12   Period Weeks  Status ONGOING  Target Date 01/31/2023       PT LONG TERM GOAL #  Title Pt will improve BERG by at least 3 points in order to demonstrate clinically significant improvement in balance.   Baseline 11/08/2022= 33/56  Time 12   Period Weeks  Status NEW  Target Date  01/31/2023       Plan -     Clinical Impression Statement Patient presents with good motivation for recert visit. Patient was not feeling well but agreeable to try his best. He was able to demo some progress despite not feeling well today- Improved TUG and 5xSTS test indicating some improvement. Added a balance goal to continue to focus on optimal functional mobility with decreased fall risk. He continues to be very limited overall with endurance and may benefit from adding walker for longer duration ambulation for improved community distances.  Patient's condition has the potential to improve in response to therapy. Maximum improvement is yet to be obtained. The anticipated improvement is attainable and reasonable in a generally predictable time. Pt will continue to benefit from skilled PT services to address deficits and impairment identified in evaluation in order to maximize independence and safety in basic mobility required for performance of ADL, IADL, and leisure.     Personal Factors and Comorbidities Age;Past/Current Experience;Time since onset of injury/illness/exacerbation;Comorbidity 3+    Comorbidities COPD, History of Cancer, myasthenia gravis, chronic lumbar surgical history    Examination-Activity Limitations Lift;Squat;Bend;Stairs;Stand;Transfers;Insurance claims handler;Bathing;Hygiene/Grooming;Dressing;Toileting;Bed Mobility;Caring for Others;Continence    Examination-Participation Restrictions Yard Work;Church;Cleaning;Driving;Community Activity    Stability/Clinical Decision Making Evolving/Moderate complexity    Rehab Potential Good    Clinical Impairments Affecting Rehab Potential multipe comorbidities    PT Frequency 1-2x / week    PT Duration 12 weeks    PT Treatment/Interventions ADLs/Self Care Home Management;Electrical Stimulation;Moist Heat;Traction;Ultrasound;Gait training;Stair training;Functional mobility training;Therapeutic activities;Therapeutic  exercise;Balance training;Neuromuscular re-education;Patient/family  education;Manual techniques;Passive range of motion;Dry needling;Joint Manipulations;Cryotherapy;Vestibular;Canalith Repostioning    PT Next Visit Plan standing strengthening and balance, seated strengthening and coordination    PT Home Exercise Plan Progress LE strengthening, balance training,  and functional endurance   Consulted and Agree with Plan of Care Patient             3:00 PM, 11/10/22 Ollen Bowl, PT Physical Therapist - [place] [phone]     11/10/22, 3:00 PM

## 2022-11-14 NOTE — Therapy (Signed)
OUTPATIENT PHYSICAL THERAPY TREATMENT NOTE     Patient Name: Francisco [name] MRN: [id number] DOB:1945/10/16, 77 y.o., male Today's Date: 11/15/2022  PCP: Adin Hector, MD REFERRING PROVIDER: Meade Maw MD   PT End of Session - 11/15/22 1514     Visit Number 51    Number of Visits 66    Date for PT Re-Evaluation 01/31/23    Authorization Type [employer] Medicare    Authorization Time Period 08/11/22-11/03/2022    Progress Note Due on Visit 88    PT Start Time 1515    PT Stop Time 1558    PT Time Calculation (min) 43 min    Equipment Utilized During Treatment Gait belt    Activity Tolerance Patient tolerated treatment well;No increased pain    Behavior During Therapy WFL for tasks assessed/performed                     Past Medical History:  Diagnosis Date   Arthritis    lower left hip   Atypical angina    Bilateral hand numbness    from back surgery   Bronchitis, chronic (HCC)    Cancer ([place])    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; Merkel cell cancer, and Basal cell cancer (twice; 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   Carotid stenosis    CHF (congestive heart failure) (HCC)    CKD (chronic kidney disease)    CKD (chronic kidney disease) stage 3, GFR 30-59 ml/min (HCC)    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   DDD (degenerative disc disease), cervical    Dysrhythmia    post carotid stent bradycardia; PAF 09/2020   GERD (gastroesophageal reflux disease)    Hypercholesterolemia    Hypertension    Hypothyroidism    pt takes Levothyroxine daily   Lumbosacral spinal stenosis    Myasthenia gravis, adult form ([place])    PAD (peripheral artery disease) (HCC)    Shortness of breath    Lung MD- Dr Darlin Coco   Sleep apnea    do not use CPAP every night   Past Surgical History:  Procedure Laterality Date   ANTERIOR CERVICAL DECOMP/DISCECTOMY FUSION  07/18/2011   Procedure: ANTERIOR CERVICAL DECOMPRESSION/DISCECTOMY FUSION 2 LEVELS;  Surgeon: Cooper Render  Pool;  Location: [facility];  Service: Neurosurgery;  Laterality: N/A;  cervical five-six, cervical six-seven anterior cervical discectomy and fusion   BACK SURGERY     in [place]     01/2020 Right, 04/2020 Left   CARDIAC CATHETERIZATION     2005 at [facility], no stents   CAROTID PTA/STENT INTERVENTION N/A 09/17/2020   Procedure: CAROTID PTA/STENT INTERVENTION;  Surgeon: Algernon Huxley, MD;  Location: [place] [facility];  Service: Cardiovascular;  Laterality: N/A;   CATARACT EXTRACTION W/PHACO Left 01/06/2020   Procedure: CATARACT EXTRACTION PHACO AND INTRAOCULAR LENS PLACEMENT (IOC) ISTENT INJ LEFT 3.81  00:33.3;  Surgeon: Eulogio Bear, MD;  Location: [place];  Service: Ophthalmology;  Laterality: Left;   CATARACT EXTRACTION W/PHACO Right 02/03/2020   Procedure: CATARACT EXTRACTION PHACO AND INTRAOCULAR LENS PLACEMENT ([place]) RIGHT ISTENT INJ;  Surgeon: Eulogio Bear, MD;  Location: [place];  Service: Ophthalmology;  Laterality: Right;  4.29 0:35.6   COLONOSCOPY     HERNIA REPAIR Left    inguinal hernia repair in [place] MICRODISCECTOMY Left 02/24/2014   Procedure: LUMBAR LAMINECTOMY/DECOMPRESSION MICRODISCECTOMY LUMBAR THREE-FOUR, FOUR-FIVE, LEFT  FIVE-SACRAL ONE ;  Surgeon: Charlie Pitter, MD;  Location: [place] [facility];  Service: Neurosurgery;  Laterality: Left;  LUMBAR LAMINECTOMY/DECOMPRESSION MICRODISCECTOMY LUMBAR THREE-FOUR, FOUR-FIVE, LEFT FIVE-SACRAL ONE    LUMBAR LAMINECTOMY/DECOMPRESSION MICRODISCECTOMY N/A 05/03/2021   Procedure: Laminectomy and Foraminotomy - L2-L3;  Surgeon: Earnie Larsson, MD;  Location: [place];  Service: Neurosurgery;  Laterality: N/A;  3C   POSTERIOR CERVICAL FUSION/FORAMINOTOMY N/A 08/07/2020   Procedure: C3-6 POSTERIOR FUSION WITH DECOMPRESSION;  Surgeon: Meade Maw, MD;  Location: [facility];  Service: Neurosurgery;  Laterality: N/A;   PROSTATECTOMY  8/14   [facility]  Dr Mare Ferrari    Patient Active Problem List   Diagnosis Date Noted   SVT (supraventricular tachycardia) 12/30/2021   Hypothyroidism 12/30/2021   Parkinson's disease 12/30/2021   Elevated troponin 12/30/2021   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   Acquired thrombophilia ([place]) 01/13/2021   History of decompression of median nerve 01/01/2021   Paroxysmal atrial fibrillation ([place]) 10/06/2020   Carotid stenosis, right 09/17/2020   S/P cervical spinal fusion    Leukocytosis    Essential hypertension    Anemia of chronic disease    Postoperative pain    Neuropathic pain    Cervical myelopathy ([place]) 08/07/2020   Preop cardiovascular exam 07/17/2020   SOB (shortness of breath) on exertion 07/17/2020   Leg weakness, bilateral 07/13/2020   Aortic atherosclerosis ([place]) 07/09/2020   Body mass index (BMI) 34.0-34.9, adult 12/25/2019   Myalgia 10/30/2019   Lumbar post-laminectomy syndrome 10/24/2019   PAD (peripheral artery disease) ([place]) 06/06/2019   CKD (chronic kidney disease) stage 3, GFR 30-59 ml/min (HCC) 04/29/2019   B12 deficiency 01/23/2019   Left arm numbness 02/28/2018   Neck pain 02/28/2018   Anemia 02/02/2017   DDD (degenerative disc disease), cervical 02/02/2017   Hypothyroid 02/02/2017   MRSA (methicillin resistant staph aureus) culture positive 02/02/2017   Nocturnal hypoxia 02/02/2017   Senile purpura ([place]) 10/03/2016   Essential hypertension, benign 09/16/2016   Bilateral carotid artery disease ([place]) 09/16/2016   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/18/2016   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   History of prostate cancer 12/21/2015   Left carpal tunnel syndrome 11/11/2015   Kidney stone on left side 07/05/2015   Myasthenia gravis ([place]) 05/26/2015   Radiculitis 01/05/2015   Long-term use of high-risk medication 10/15/2014   Persistent cough 09/10/2014   Pure hypercholesterolemia 07/25/2014   Spinal stenosis, lumbar region, with  neurogenic claudication 02/24/2014   Lumbosacral stenosis with neurogenic claudication 02/24/2014   COPD (chronic obstructive pulmonary disease) ([place]) 01/22/2014    REFERRING DIAG: balance disorder   THERAPY DIAG:  Difficulty in walking, not elsewhere classified  Muscle weakness (generalized)  Unsteadiness on feet  Other abnormalities of gait and mobility  Chronic bilateral low back pain, unspecified whether sciatica present  Other lack of coordination  Rationale for Evaluation and Treatment Rehabilitation  PERTINENT HISTORY: Francisco [name] is a 77 year old male referred to [facility] neuro for difficulty with balance. He was also just recently dx in May 2023 with Impingement syndrome of left shoulder (M75.42) Impingement syndrome of left shoulder (primary encounter diagnosis) Left shoulder pain, unspecified chronicity Nontraumatic rupture of left proximal biceps tendon Past medical hx includes Lumbar laminectomy 04/2021. C3-6 decompression, fusion on 11/26, myasthenia gravis, 2012 ACDF 5/6, 6/7; OSA, hypothyroidism, HTN, COPD, multiple back surgeries, 2021 carpal tunnel release.  PRECAUTIONS: Fall  SUBJECTIVE:  Patient reports having a good day yesterday and working out in yard- pushing a Systems analyst  then sitting when needed.    PAIN:  Are you having pain? None reported  TODAY'S TREATMENT: 11/15/22   Therex:   NuStep:  L1 x 2 min L3 x 3 min  Standing postural - scap retraction using Matrix cable system  - 7.5# x 10 reps BUE and 2 sets of 12.5 # at 10 reps  Standing postural- Horizontal ABD Standing postural - shoulder ext- using Matrix cable system  - 7.5# x 10 reps BUE and 2 sets of 12.5 # at 10 reps   Sit to stand - several up and down out of chair to perform above therex.  Lumbar flex to ext - holding onto cane 2 sets of 10 reps Walking with cane x110 feet with CGA and w/c follow- Reciprocal steps   Pt educated throughout session about proper posture and technique  with exercises. Improved exercise technique, movement at target joints, use of target muscles after min to mod verbal, visual, tactile cues.   PATIENT EDUCATION: Education details: Goals, plan, exercise technique, body mechanics, energy conservation techniques  Person educated: Patient Education method: Explanation, Demonstration, Tactile cues, Verbal cues, and Handouts Education comprehension: verbalized understanding, returned demonstration, verbal cues required, and tactile cues required   HOME EXERCISE PROGRAM:  No Updates today  Access Code: [email or web address] URL: [email or web address] Date: 03/01/2022 Prepared by: Janna Arch  Exercises - Seated March  - 1 x daily - 7 x weekly - 2 sets - 10 reps - 5 hold - Seated Long Arc Quad  - 1 x daily - 7 x weekly - 2 sets - 10 reps - 5 hold - Seated Hip Abduction  - 1 x daily - 7 x weekly - 2 sets - 10 reps - 5 hold - Seated Hip Adduction Isometrics with Ball  - 1 x daily - 7 x weekly - 2 sets - 10 reps - 5 hold - Seated Gluteal Sets  - 1 x daily - 7 x weekly - 2 sets - 10 reps - 5 hold - Seated Heel Toe Raises  - 1 x daily - 7 x weekly - 2 sets - 10 reps - 5 hold LE strength and balance         PT Short Term Goals       PT SHORT TERM GOAL #1   Title Pt will be independent with initial HEP in order to improve strength and balance in order to decrease fall risk and improve function at home and work.    Baseline 02/23/2022- Patient reports not doing much as far as exercise in the home currently. 04/20/2022- Patient reports compliant with HEP and no questions at this time.; 9/7 pt indep   Time 6    Period Weeks    Status Goal met   Target Date 04/06/22              PT Long Term Goals       PT LONG TERM GOAL #1   Title Pt will be independent with final HEP in order to improve strength and balance in order to decrease fall risk and improve function at home and work. 04/20/2022- Patient reports compliant with HEP and no  questions at this time.    Baseline 02/23/2022= No formal HEP in place. 9/7: pt performing at least once a day, feels comfortable   Time 12    Period Weeks    Status GOAL MET   Target Date 08/11/2022      PT LONG TERM GOAL #2  Title Pt will decrease 5TSTS by at least 8 seconds in order to demonstrate clinically significant improvement in LE strength.    Baseline 02/23/2022= 31.52 sec with B hands on knees; 04/20/2022= 16.08 sec with B hands on knees   Time 12    Period Days    Status Goal Met   Target Date 05/18/22      PT LONG TERM GOAL #3   Title Pt will increase 10MWT by at least 0.23 m/s in order to demonstrate clinically significant improvement in community ambulation.    Baseline 02/23/2022= 0.39 m/s using RW; 0.45 m/s; 9/7: 0.37 m/s with QC, 0.74 m/s with RW   Time 12    Period Weeks    Status GOAL MET   Target Date 05/18/22      PT LONG TERM GOAL #4   Title Pt will improve FOTO to target score of 50 to display perceived improvements in ability to complete ADL's.    Baseline 02/23/2022= 47; 9/7: 49; 11/30=54%   Time 12    Period Weeks    Status GOAL MET   Target Date 08/11/2022      PT LONG TERM GOAL #5   Title Pt will decrease TUG to below 20 seconds/decrease in order to demonstrate decreased fall risk.    Baseline 02/23/2022=28.27 sec with RW; 04/20/2022= 20.22 with SBQC; 9/7: 14.4 sec with RW, 18.5 sec with SBQC   Time 12    Period Weeks    Status MET   Target Date 05/18/22    PT LONG TERM GOAL #6  Title Pt will decrease TUG to 14 sec or below with SBQC in order to demonstrate decreased fall risk.   Baseline  9/7: 14.4 sec with RW, 18.5 sec with SBQC; 11/30=16.85 sec without an AD. 09/15/2022= 22.14 sec with SPC; 11/08/2022= 25.17 sec without and AD and 21.22 sec with SPC  Time 12   Period Weeks   Status ONGOING  Target Date 01/31/2023   PT LONG TERM GOAL #7  Title Patient will increase six minute walk test distance to >700 ft for improved gait ability and increased ease  with community participation.  Baseline 9/7: completes 4 minutes and 444 ft with RW. Test discontinued at 4 minutes due to [place]. 08/11/2022= patient ambulated 200 feet yet required 1 seated rest break- due to BLE fatigue- using SBQC: 09/15/2022= Patient ambulated 300 feet in 4 min 20 sec with 1 seated rest break using SBQC. 11/08/2022 = 175 feet- stopped due to exhausted today- 2 min 10 sec  Time 12   Period Weeks   Status ONGOING  Target Date 01/31/2023   PT LONG TERM GOAL #8  Title Pt will decrease 5TSTS by at least 2 seconds (without UE support)  in order to demonstrate clinically significant improvement in LE strength.   Baseline 08/11/2022= 16.33 sec with B hands on knees; 09/15/2022= 22 sec with B hands on knees (patient reports increased weakness since around Christmas and just has not bounced back); 11/08/2022= 18.85 without UE Support  Time 12   Period Weeks  Status ONGOING  Target Date 01/31/2023       PT LONG TERM GOAL #  Title Pt will improve BERG by at least 3 points in order to demonstrate clinically significant improvement in balance.   Baseline 11/08/2022= 33/56  Time 12   Period Weeks  Status NEW  Target Date 01/31/2023       Plan -     Clinical Impression Statement Patient performed well  today focusing on standing endurance with dynamic activities including postural strengthening. Patient able to demo more erect standing and improving ability to stand with less rest breaks. He continues to work hard and responsive to all VC for safe technique. Pt will continue to benefit from skilled PT services to address deficits and impairment identified in evaluation in order to maximize independence and safety in basic mobility required for performance of ADL, IADL, and leisure.     Personal Factors and Comorbidities Age;Past/Current Experience;Time since onset of injury/illness/exacerbation;Comorbidity 3+    Comorbidities COPD, History of Cancer, myasthenia gravis, chronic lumbar  surgical history    Examination-Activity Limitations Lift;Squat;Bend;Stairs;Stand;Transfers;Insurance claims handler;Bathing;Hygiene/Grooming;Dressing;Toileting;Bed Mobility;Caring for Others;Continence    Examination-Participation Restrictions Yard Work;Church;Cleaning;Driving;Community Activity    Stability/Clinical Decision Making Evolving/Moderate complexity    Rehab Potential Good    Clinical Impairments Affecting Rehab Potential multipe comorbidities    PT Frequency 1-2x / week    PT Duration 12 weeks    PT Treatment/Interventions ADLs/Self Care Home Management;Electrical Stimulation;Moist Heat;Traction;Ultrasound;Gait training;Stair training;Functional mobility training;Therapeutic activities;Therapeutic exercise;Balance training;Neuromuscular re-education;Patient/family education;Manual techniques;Passive range of motion;Dry needling;Joint Manipulations;Cryotherapy;Vestibular;Canalith Repostioning    PT Next Visit Plan standing strengthening and balance, seated strengthening and coordination    PT Home Exercise Plan Progress LE strengthening, balance training,  and functional endurance   Consulted and Agree with Plan of Care Patient             4:32 PM, 11/15/22 Ollen Bowl, PT Physical Therapist - [place] [phone]     11/15/22, 4:32 PM

## 2022-11-15 ENCOUNTER — Ambulatory Visit: Payer: Medicare Other | Attending: Neurosurgery

## 2022-11-15 DIAGNOSIS — R2689 Other abnormalities of gait and mobility: Secondary | ICD-10-CM | POA: Diagnosis present

## 2022-11-15 DIAGNOSIS — R269 Unspecified abnormalities of gait and mobility: Secondary | ICD-10-CM | POA: Insufficient documentation

## 2022-11-15 DIAGNOSIS — R2681 Unsteadiness on feet: Secondary | ICD-10-CM | POA: Insufficient documentation

## 2022-11-15 DIAGNOSIS — M5442 Lumbago with sciatica, left side: Secondary | ICD-10-CM | POA: Insufficient documentation

## 2022-11-15 DIAGNOSIS — M6281 Muscle weakness (generalized): Secondary | ICD-10-CM | POA: Diagnosis present

## 2022-11-15 DIAGNOSIS — G8929 Other chronic pain: Secondary | ICD-10-CM | POA: Diagnosis present

## 2022-11-15 DIAGNOSIS — G959 Disease of spinal cord, unspecified: Secondary | ICD-10-CM | POA: Diagnosis present

## 2022-11-15 DIAGNOSIS — R296 Repeated falls: Secondary | ICD-10-CM | POA: Diagnosis present

## 2022-11-15 DIAGNOSIS — R278 Other lack of coordination: Secondary | ICD-10-CM

## 2022-11-15 DIAGNOSIS — M545 Low back pain, unspecified: Secondary | ICD-10-CM | POA: Diagnosis present

## 2022-11-15 DIAGNOSIS — R262 Difficulty in walking, not elsewhere classified: Secondary | ICD-10-CM | POA: Diagnosis present

## 2022-11-17 ENCOUNTER — Ambulatory Visit: Payer: Medicare Other | Admitting: Physical Therapy

## 2022-11-17 ENCOUNTER — Encounter: Payer: Self-pay | Admitting: Physical Therapy

## 2022-11-17 DIAGNOSIS — R262 Difficulty in walking, not elsewhere classified: Secondary | ICD-10-CM | POA: Diagnosis not present

## 2022-11-17 DIAGNOSIS — R2681 Unsteadiness on feet: Secondary | ICD-10-CM

## 2022-11-17 DIAGNOSIS — M6281 Muscle weakness (generalized): Secondary | ICD-10-CM

## 2022-11-17 DIAGNOSIS — R2689 Other abnormalities of gait and mobility: Secondary | ICD-10-CM

## 2022-11-17 NOTE — Therapy (Signed)
OUTPATIENT PHYSICAL THERAPY TREATMENT NOTE     Patient Name: Francisco [name] MRN: [id number] DOB:05-04-1946, 77 y.o., male Today's Date: 11/17/2022  PCP: Adin Hector, MD REFERRING PROVIDER: Meade Maw MD   PT End of Session - 11/17/22 1434     Visit Number 52    Number of Visits 86    Date for PT Re-Evaluation 01/31/23    Authorization Type [employer] Medicare    Authorization Time Period 08/11/22-11/03/2022    Progress Note Due on Visit 60    PT Start Time 1430    PT Stop Time 1514    PT Time Calculation (min) 44 min    Equipment Utilized During Treatment Gait belt    Activity Tolerance Patient tolerated treatment well;No increased pain    Behavior During Therapy WFL for tasks assessed/performed                      Past Medical History:  Diagnosis Date   Arthritis    lower left hip   Atypical angina    Bilateral hand numbness    from back surgery   Bronchitis, chronic (HCC)    Cancer ([place])    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; Merkel cell cancer, and Basal cell cancer (twice; 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   Carotid stenosis    CHF (congestive heart failure) (HCC)    CKD (chronic kidney disease)    CKD (chronic kidney disease) stage 3, GFR 30-59 ml/min (HCC)    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   DDD (degenerative disc disease), cervical    Dysrhythmia    post carotid stent bradycardia; PAF 09/2020   GERD (gastroesophageal reflux disease)    Hypercholesterolemia    Hypertension    Hypothyroidism    pt takes Levothyroxine daily   Lumbosacral spinal stenosis    Myasthenia gravis, adult form ([place])    PAD (peripheral artery disease) (HCC)    Shortness of breath    Lung MD- Dr Darlin Coco   Sleep apnea    do not use CPAP every night   Past Surgical History:  Procedure Laterality Date   ANTERIOR CERVICAL DECOMP/DISCECTOMY FUSION  07/18/2011   Procedure: ANTERIOR CERVICAL DECOMPRESSION/DISCECTOMY FUSION 2 LEVELS;  Surgeon: Cooper Render  Pool;  Location: [facility];  Service: Neurosurgery;  Laterality: N/A;  cervical five-six, cervical six-seven anterior cervical discectomy and fusion   BACK SURGERY     in [place]     01/2020 Right, 04/2020 Left   CARDIAC CATHETERIZATION     2005 at [facility], no stents   CAROTID PTA/STENT INTERVENTION N/A 09/17/2020   Procedure: CAROTID PTA/STENT INTERVENTION;  Surgeon: Algernon Huxley, MD;  Location: [facility];  Service: Cardiovascular;  Laterality: N/A;   CATARACT EXTRACTION W/PHACO Left 01/06/2020   Procedure: CATARACT EXTRACTION PHACO AND INTRAOCULAR LENS PLACEMENT (IOC) ISTENT INJ LEFT 3.81  00:33.3;  Surgeon: Eulogio Bear, MD;  Location: [facility];  Service: Ophthalmology;  Laterality: Left;   CATARACT EXTRACTION W/PHACO Right 02/03/2020   Procedure: CATARACT EXTRACTION PHACO AND INTRAOCULAR LENS PLACEMENT ([place]) RIGHT ISTENT INJ;  Surgeon: Eulogio Bear, MD;  Location: [facility];  Service: Ophthalmology;  Laterality: Right;  4.29 0:35.6   COLONOSCOPY     HERNIA REPAIR Left    inguinal hernia repair in 1985   LUMBAR LAMINECTOMY/DECOMPRESSION MICRODISCECTOMY Left 02/24/2014   Procedure: LUMBAR LAMINECTOMY/DECOMPRESSION MICRODISCECTOMY LUMBAR THREE-FOUR, FOUR-FIVE,  LEFT FIVE-SACRAL ONE ;  Surgeon: Charlie Pitter, MD;  Location: [facility];  Service: Neurosurgery;  Laterality: Left;  LUMBAR LAMINECTOMY/DECOMPRESSION MICRODISCECTOMY LUMBAR THREE-FOUR, FOUR-FIVE, LEFT FIVE-SACRAL ONE    LUMBAR LAMINECTOMY/DECOMPRESSION MICRODISCECTOMY N/A 05/03/2021   Procedure: Laminectomy and Foraminotomy - L2-L3;  Surgeon: Earnie Larsson, MD;  Location: [facility];  Service: Neurosurgery;  Laterality: N/A;  3C   POSTERIOR CERVICAL FUSION/FORAMINOTOMY N/A 08/07/2020   Procedure: C3-6 POSTERIOR FUSION WITH DECOMPRESSION;  Surgeon: Meade Maw, MD;  Location: [facility];  Service: Neurosurgery;  Laterality: N/A;   PROSTATECTOMY  8/14   [facility]  Dr Mare Ferrari    Patient Active Problem List   Diagnosis Date Noted   SVT (supraventricular tachycardia) 12/30/2021   Hypothyroidism 12/30/2021   Parkinson's disease 12/30/2021   Elevated troponin 12/30/2021   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   Acquired thrombophilia ([place]) 01/13/2021   History of decompression of median nerve 01/01/2021   Paroxysmal atrial fibrillation ([place]) 10/06/2020   Carotid stenosis, right 09/17/2020   S/P cervical spinal fusion    Leukocytosis    Essential hypertension    Anemia of chronic disease    Postoperative pain    Neuropathic pain    Cervical myelopathy ([place]) 08/07/2020   Preop cardiovascular exam 07/17/2020   SOB (shortness of breath) on exertion 07/17/2020   Leg weakness, bilateral 07/13/2020   Aortic atherosclerosis ([place]) 07/09/2020   Body mass index (BMI) 34.0-34.9, adult 12/25/2019   Myalgia 10/30/2019   Lumbar post-laminectomy syndrome 10/24/2019   PAD (peripheral artery disease) ([place]) 06/06/2019   CKD (chronic kidney disease) stage 3, GFR 30-59 ml/min (HCC) 04/29/2019   B12 deficiency 01/23/2019   Left arm numbness 02/28/2018   Neck pain 02/28/2018   Anemia 02/02/2017   DDD (degenerative disc disease), cervical 02/02/2017   Hypothyroid 02/02/2017   MRSA (methicillin resistant staph aureus) culture positive 02/02/2017   Nocturnal hypoxia 02/02/2017   Senile purpura ([place]) 10/03/2016   Essential hypertension, benign 09/16/2016   Bilateral carotid artery disease ([place]) 09/16/2016   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/18/2016   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   History of prostate cancer 12/21/2015   Left carpal tunnel syndrome 11/11/2015   Kidney stone on left side 07/05/2015   Myasthenia gravis ([place]) 05/26/2015   Radiculitis 01/05/2015   Long-term use of high-risk medication 10/15/2014   Persistent cough 09/10/2014   Pure hypercholesterolemia 07/25/2014   Spinal stenosis, lumbar region, with  neurogenic claudication 02/24/2014   Lumbosacral stenosis with neurogenic claudication 02/24/2014   COPD (chronic obstructive pulmonary disease) ([place]) 01/22/2014    REFERRING DIAG: balance disorder   THERAPY DIAG:  Difficulty in walking, not elsewhere classified  Muscle weakness (generalized)  Unsteadiness on feet  Other abnormalities of gait and mobility  Rationale for Evaluation and Treatment Rehabilitation  PERTINENT HISTORY: Francisco [name] is a 77 year old male referred to [facility] neuro for difficulty with balance. He was also just recently dx in May 2023 with Impingement syndrome of left shoulder (M75.42) Impingement syndrome of left shoulder (primary encounter diagnosis) Left shoulder pain, unspecified chronicity Nontraumatic rupture of left proximal biceps tendon Past medical hx includes Lumbar laminectomy 04/2021. C3-6 decompression, fusion on 11/26, myasthenia gravis, 2012 ACDF 5/6, 6/7; OSA, hypothyroidism, HTN, COPD, multiple back surgeries, 2021 carpal tunnel release.  PRECAUTIONS: Fall  SUBJECTIVE:  Patient reports having a good day yesterday and working out in yard- pushing a transport chair then sitting when needed.    PAIN:  Are you having pain? None  reported  TODAY'S TREATMENT: 11/17/22   Therex:   NuStep:  L1 x 1 min L3 x 1 min L1 x 1 min  L3 x 1 min  L1 x 1 min  L3 x 1 min   Standing postural - scap retraction using Matrix cable system  -  2 sets of 12.5 # at 12 reps  Standing postural- Horizontal ABD RTB 2 x 10 Standing postural - shoulder ext- using Matrix cable system  - 2 sets of 12.5 # at 10 reps   Sit to stand - several up and down out of chair to perform above therex.  Walking with cane 2 x 75 feet with CGA and w/c follow- Reciprocal steps  Seated step over 1 inch object x 10 reps ea LE with 3# AW  Seated clamshell 15 x 5 sec holds with GTB   Pt educated throughout session about proper posture and technique with exercises. Improved exercise  technique, movement at target joints, use of target muscles after min to mod verbal, visual, tactile cues.   PATIENT EDUCATION: Education details: Goals, plan, exercise technique, body mechanics, energy conservation techniques  Person educated: Patient Education method: Explanation, Demonstration, Tactile cues, Verbal cues, and Handouts Education comprehension: verbalized understanding, returned demonstration, verbal cues required, and tactile cues required   HOME EXERCISE PROGRAM:  No Updates today  Access Code: [email or web address] URL: [email or web address] Date: 03/01/2022 Prepared by: Janna Arch  Exercises - Seated March  - 1 x daily - 7 x weekly - 2 sets - 10 reps - 5 hold - Seated Long Arc Quad  - 1 x daily - 7 x weekly - 2 sets - 10 reps - 5 hold - Seated Hip Abduction  - 1 x daily - 7 x weekly - 2 sets - 10 reps - 5 hold - Seated Hip Adduction Isometrics with Ball  - 1 x daily - 7 x weekly - 2 sets - 10 reps - 5 hold - Seated Gluteal Sets  - 1 x daily - 7 x weekly - 2 sets - 10 reps - 5 hold - Seated Heel Toe Raises  - 1 x daily - 7 x weekly - 2 sets - 10 reps - 5 hold LE strength and balance         PT Short Term Goals       PT SHORT TERM GOAL #1   Title Pt will be independent with initial HEP in order to improve strength and balance in order to decrease fall risk and improve function at home and work.    Baseline 02/23/2022- Patient reports not doing much as far as exercise in the home currently. 04/20/2022- Patient reports compliant with HEP and no questions at this time.; 9/7 pt indep   Time 6    Period Weeks    Status Goal met   Target Date 04/06/22              PT Long Term Goals       PT LONG TERM GOAL #1   Title Pt will be independent with final HEP in order to improve strength and balance in order to decrease fall risk and improve function at home and work. 04/20/2022- Patient reports compliant with HEP and no questions at this time.    Baseline  02/23/2022= No formal HEP in place. 9/7: pt performing at least once a day, feels comfortable   Time 12    Period Weeks    Status GOAL MET   Target Date  08/11/2022      PT LONG TERM GOAL #2   Title Pt will decrease 5TSTS by at least 8 seconds in order to demonstrate clinically significant improvement in LE strength.    Baseline 02/23/2022= 31.52 sec with B hands on knees; 04/20/2022= 16.08 sec with B hands on knees   Time 12    Period Days    Status Goal Met   Target Date 05/18/22      PT LONG TERM GOAL #3   Title Pt will increase 10MWT by at least 0.23 m/s in order to demonstrate clinically significant improvement in community ambulation.    Baseline 02/23/2022= 0.39 m/s using RW; 0.45 m/s; 9/7: 0.37 m/s with QC, 0.74 m/s with RW   Time 12    Period Weeks    Status GOAL MET   Target Date 05/18/22      PT LONG TERM GOAL #4   Title Pt will improve FOTO to target score of 50 to display perceived improvements in ability to complete ADL's.    Baseline 02/23/2022= 47; 9/7: 49; 11/30=54%   Time 12    Period Weeks    Status GOAL MET   Target Date 08/11/2022      PT LONG TERM GOAL #5   Title Pt will decrease TUG to below 20 seconds/decrease in order to demonstrate decreased fall risk.    Baseline 02/23/2022=28.27 sec with RW; 04/20/2022= 20.22 with SBQC; 9/7: 14.4 sec with RW, 18.5 sec with SBQC   Time 12    Period Weeks    Status MET   Target Date 05/18/22    PT LONG TERM GOAL #6  Title Pt will decrease TUG to 14 sec or below with SBQC in order to demonstrate decreased fall risk.   Baseline  9/7: 14.4 sec with RW, 18.5 sec with SBQC; 11/30=16.85 sec without an AD. 09/15/2022= 22.14 sec with SPC; 11/08/2022= 25.17 sec without and AD and 21.22 sec with SPC  Time 12   Period Weeks   Status ONGOING  Target Date 01/31/2023   PT LONG TERM GOAL #7  Title Patient will increase six minute walk test distance to >700 ft for improved gait ability and increased ease with community participation.   Baseline 9/7: completes 4 minutes and 444 ft with RW. Test discontinued at 4 minutes due to [place]. 08/11/2022= patient ambulated 200 feet yet required 1 seated rest break- due to BLE fatigue- using SBQC: 09/15/2022= Patient ambulated 300 feet in 4 min 20 sec with 1 seated rest break using SBQC. 11/08/2022 = 175 feet- stopped due to exhausted today- 2 min 10 sec  Time 12   Period Weeks   Status ONGOING  Target Date 01/31/2023   PT LONG TERM GOAL #8  Title Pt will decrease 5TSTS by at least 2 seconds (without UE support)  in order to demonstrate clinically significant improvement in LE strength.   Baseline 08/11/2022= 16.33 sec with B hands on knees; 09/15/2022= 22 sec with B hands on knees (patient reports increased weakness since around Christmas and just has not bounced back); 11/08/2022= 18.85 without UE Support  Time 12   Period Weeks  Status ONGOING  Target Date 01/31/2023       PT LONG TERM GOAL #  Title Pt will improve BERG by at least 3 points in order to demonstrate clinically significant improvement in balance.   Baseline 11/08/2022= 33/56  Time 12   Period Weeks  Status NEW  Target Date 01/31/2023  Plan -     Clinical Impression Statement Patient performed well today focusing on standing endurance with dynamic activities including postural strengthening. Pt continues to have fatigue when attempting to ambulate without AD but did show improved mechanics with AD.  Pt will continue to benefit from skilled PT services to address deficits and impairment identified in evaluation in order to maximize independence and safety in basic mobility required for performance of ADL, IADL, and leisure.     Personal Factors and Comorbidities Age;Past/Current Experience;Time since onset of injury/illness/exacerbation;Comorbidity 3+    Comorbidities COPD, History of Cancer, myasthenia gravis, chronic lumbar surgical history    Examination-Activity Limitations  Lift;Squat;Bend;Stairs;Stand;Transfers;Insurance claims handler;Bathing;Hygiene/Grooming;Dressing;Toileting;Bed Mobility;Caring for Others;Continence    Examination-Participation Restrictions Yard Work;Church;Cleaning;Driving;Community Activity    Stability/Clinical Decision Making Evolving/Moderate complexity    Rehab Potential Good    Clinical Impairments Affecting Rehab Potential multipe comorbidities    PT Frequency 1-2x / week    PT Duration 12 weeks    PT Treatment/Interventions ADLs/Self Care Home Management;Electrical Stimulation;Moist Heat;Traction;Ultrasound;Gait training;Stair training;Functional mobility training;Therapeutic activities;Therapeutic exercise;Balance training;Neuromuscular re-education;Patient/family education;Manual techniques;Passive range of motion;Dry needling;Joint Manipulations;Cryotherapy;Vestibular;Canalith Repostioning    PT Next Visit Plan standing strengthening and balance, seated strengthening and coordination    PT Home Exercise Plan Progress LE strengthening, balance training,  and functional endurance   Consulted and Agree with Plan of Care Patient             4:49 PM, 11/17/22 [place] ,DPT Physical Therapist- [facility]     11/17/22, 4:49 PM

## 2022-11-22 ENCOUNTER — Ambulatory Visit: Payer: Medicare Other

## 2022-11-22 DIAGNOSIS — R262 Difficulty in walking, not elsewhere classified: Secondary | ICD-10-CM

## 2022-11-22 DIAGNOSIS — R2689 Other abnormalities of gait and mobility: Secondary | ICD-10-CM

## 2022-11-22 DIAGNOSIS — M6281 Muscle weakness (generalized): Secondary | ICD-10-CM

## 2022-11-22 DIAGNOSIS — R2681 Unsteadiness on feet: Secondary | ICD-10-CM

## 2022-11-22 DIAGNOSIS — G8929 Other chronic pain: Secondary | ICD-10-CM

## 2022-11-22 NOTE — Therapy (Signed)
OUTPATIENT PHYSICAL THERAPY TREATMENT NOTE     Patient Name: Francisco [name] MRN: [id number] DOB:April 29, 1946, 77 y.o., male Today's Date: 11/22/2022  PCP: Adin Hector, MD REFERRING PROVIDER: Meade Maw MD   PT End of Session - 11/22/22 1521     Visit Number 61    Number of Visits 64    Date for PT Re-Evaluation 01/31/23    Authorization Type [employer] Medicare    Authorization Time Period 08/11/22-11/03/2022    Progress Note Due on Visit 45    PT Start Time 1518    PT Stop Time 1554    PT Time Calculation (min) 36 min    Equipment Utilized During Treatment Gait belt    Activity Tolerance Patient tolerated treatment well;No increased pain;Patient limited by fatigue    Behavior During Therapy [facility] for tasks assessed/performed                      Past Medical History:  Diagnosis Date   Arthritis    lower left hip   Atypical angina    Bilateral hand numbness    from back surgery   Bronchitis, chronic (HCC)    Cancer ([place])    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; Merkel cell cancer, and Basal cell cancer (twice; 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   Carotid stenosis    CHF (congestive heart failure) (HCC)    CKD (chronic kidney disease)    CKD (chronic kidney disease) stage 3, GFR 30-59 ml/min (HCC)    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   DDD (degenerative disc disease), cervical    Dysrhythmia    post carotid stent bradycardia; PAF 09/2020   GERD (gastroesophageal reflux disease)    Hypercholesterolemia    Hypertension    Hypothyroidism    pt takes Levothyroxine daily   Lumbosacral spinal stenosis    Myasthenia gravis, adult form ([place])    PAD (peripheral artery disease) ([place])    Shortness of breath    Lung MD- Dr Darlin Coco   Sleep apnea    do not use CPAP every night   Past Surgical History:  Procedure Laterality Date   ANTERIOR CERVICAL DECOMP/DISCECTOMY FUSION  07/18/2011   Procedure: ANTERIOR CERVICAL DECOMPRESSION/DISCECTOMY FUSION  2 LEVELS;  Surgeon: Cooper Render Pool;  Location: [facility];  Service: Neurosurgery;  Laterality: N/A;  cervical five-six, cervical six-seven anterior cervical discectomy and fusion   BACK SURGERY     in [place]     01/2020 Right, 04/2020 Left   CARDIAC CATHETERIZATION     2005 at [facility], no stents   CAROTID PTA/STENT INTERVENTION N/A 09/17/2020   Procedure: CAROTID PTA/STENT INTERVENTION;  Surgeon: Algernon Huxley, MD;  Location: [facility];  Service: Cardiovascular;  Laterality: N/A;   CATARACT EXTRACTION W/PHACO Left 01/06/2020   Procedure: CATARACT EXTRACTION PHACO AND INTRAOCULAR LENS PLACEMENT (IOC) ISTENT INJ LEFT 3.81  00:33.3;  Surgeon: Eulogio Bear, MD;  Location: [facility] [facility];  Service: Ophthalmology;  Laterality: Left;   CATARACT EXTRACTION W/PHACO Right 02/03/2020   Procedure: CATARACT EXTRACTION PHACO AND INTRAOCULAR LENS PLACEMENT ([place]) RIGHT ISTENT INJ;  Surgeon: Eulogio Bear, MD;  Location: [facility];  Service: Ophthalmology;  Laterality: Right;  4.29 0:35.6   COLONOSCOPY     HERNIA REPAIR Left    inguinal hernia repair in [place] MICRODISCECTOMY Left 02/24/2014   Procedure: LUMBAR LAMINECTOMY/DECOMPRESSION MICRODISCECTOMY  LUMBAR THREE-FOUR, FOUR-FIVE, LEFT FIVE-SACRAL ONE ;  Surgeon: Charlie Pitter, MD;  Location: [facility];  Service: Neurosurgery;  Laterality: Left;  LUMBAR LAMINECTOMY/DECOMPRESSION MICRODISCECTOMY LUMBAR THREE-FOUR, FOUR-FIVE, LEFT FIVE-SACRAL ONE    LUMBAR LAMINECTOMY/DECOMPRESSION MICRODISCECTOMY N/A 05/03/2021   Procedure: Laminectomy and Foraminotomy - L2-L3;  Surgeon: Earnie Larsson, MD;  Location: [facility];  Service: Neurosurgery;  Laterality: N/A;  3C   POSTERIOR CERVICAL FUSION/FORAMINOTOMY N/A 08/07/2020   Procedure: C3-6 POSTERIOR FUSION WITH DECOMPRESSION;  Surgeon: Meade Maw, MD;  Location: [facility];  Service: Neurosurgery;  Laterality: N/A;    PROSTATECTOMY  8/14   [facility] Dr Mare Ferrari    Patient Active Problem List   Diagnosis Date Noted   SVT (supraventricular tachycardia) 12/30/2021   Hypothyroidism 12/30/2021   Parkinson's disease 12/30/2021   Elevated troponin 12/30/2021   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   Acquired thrombophilia ([place]) 01/13/2021   History of decompression of median nerve 01/01/2021   Paroxysmal atrial fibrillation ([place]) 10/06/2020   Carotid stenosis, right 09/17/2020   S/P cervical spinal fusion    Leukocytosis    Essential hypertension    Anemia of chronic disease    Postoperative pain    Neuropathic pain    Cervical myelopathy ([place]) 08/07/2020   Preop cardiovascular exam 07/17/2020   SOB (shortness of breath) on exertion 07/17/2020   Leg weakness, bilateral 07/13/2020   Aortic atherosclerosis ([place]) 07/09/2020   Body mass index (BMI) 34.0-34.9, adult 12/25/2019   Myalgia 10/30/2019   Lumbar post-laminectomy syndrome 10/24/2019   PAD (peripheral artery disease) ([place]) 06/06/2019   CKD (chronic kidney disease) stage 3, GFR 30-59 ml/min (HCC) 04/29/2019   B12 deficiency 01/23/2019   Left arm numbness 02/28/2018   Neck pain 02/28/2018   Anemia 02/02/2017   DDD (degenerative disc disease), cervical 02/02/2017   Hypothyroid 02/02/2017   MRSA (methicillin resistant staph aureus) culture positive 02/02/2017   Nocturnal hypoxia 02/02/2017   Senile purpura ([place]) 10/03/2016   Essential hypertension, benign 09/16/2016   Bilateral carotid artery disease ([place]) 09/16/2016   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/18/2016   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   History of prostate cancer 12/21/2015   Left carpal tunnel syndrome 11/11/2015   Kidney stone on left side 07/05/2015   Myasthenia gravis ([place]) 05/26/2015   Radiculitis 01/05/2015   Long-term use of high-risk medication 10/15/2014   Persistent cough 09/10/2014   Pure hypercholesterolemia 07/25/2014   Spinal stenosis,  lumbar region, with neurogenic claudication 02/24/2014   Lumbosacral stenosis with neurogenic claudication 02/24/2014   COPD (chronic obstructive pulmonary disease) ([place]) 01/22/2014    REFERRING DIAG: balance disorder   THERAPY DIAG:  Difficulty in walking, not elsewhere classified  Muscle weakness (generalized)  Unsteadiness on feet  Other abnormalities of gait and mobility  Chronic bilateral low back pain, unspecified whether sciatica present  Rationale for Evaluation and Treatment Rehabilitation  PERTINENT HISTORY: Dirrick Lax is a 77 year old male referred to [facility] neuro for difficulty with balance. He was also just recently dx in May 2023 with Impingement syndrome of left shoulder (M75.42) Impingement syndrome of left shoulder (primary encounter diagnosis) Left shoulder pain, unspecified chronicity Nontraumatic rupture of left proximal biceps tendon Past medical hx includes Lumbar laminectomy 04/2021. C3-6 decompression, fusion on 11/26, myasthenia gravis, 2012 ACDF 5/6, 6/7; OSA, hypothyroidism, HTN, COPD, multiple back surgeries, 2021 carpal tunnel release.  PRECAUTIONS: Fall  SUBJECTIVE:  Pt reports having a low energy day today. No falls.    PAIN:  Are you  having pain? None reported  TODAY'S TREATMENT: 11/22/22   Therex:   NuStep:  L1 x 1 min L3 x 1 min L1 x 1 min  L3 x 1 min  L1 x 1 min cool down.   Standing postural - scap retraction using Matrix cable system  -  2 sets of 12.5 # at 12 reps   Standing postural- Horizontal ABD RTB 2 x 10  Standing postural - shoulder ext- using Matrix cable system  - 2 sets of 12.5 # at 10 reps   Sit to stand - several up and down out of chair to perform above therex  2x94' gait holding SPC but does not use it, to improve endurance/tolerance for household and short community distances. CGA. Step to pattern. Chair follow for safety. Requires seated rest b/t bouts.    Pt educated throughout session about proper posture  and technique with exercises. Improved exercise technique, movement at target joints, use of target muscles after min to mod verbal, visual, tactile cues.   PATIENT EDUCATION: Education details: Goals, plan, exercise technique, body mechanics, energy conservation techniques  Person educated: Patient Education method: Explanation, Demonstration, Tactile cues, Verbal cues, and Handouts Education comprehension: verbalized understanding, returned demonstration, verbal cues required, and tactile cues required   HOME EXERCISE PROGRAM:  No Updates today  Access Code: [email or web address] URL: [email or web address] Date: 03/01/2022 Prepared by: Janna Arch  Exercises - Seated March  - 1 x daily - 7 x weekly - 2 sets - 10 reps - 5 hold - Seated Long Arc Quad  - 1 x daily - 7 x weekly - 2 sets - 10 reps - 5 hold - Seated Hip Abduction  - 1 x daily - 7 x weekly - 2 sets - 10 reps - 5 hold - Seated Hip Adduction Isometrics with Ball  - 1 x daily - 7 x weekly - 2 sets - 10 reps - 5 hold - Seated Gluteal Sets  - 1 x daily - 7 x weekly - 2 sets - 10 reps - 5 hold - Seated Heel Toe Raises  - 1 x daily - 7 x weekly - 2 sets - 10 reps - 5 hold LE strength and balance         PT Short Term Goals       PT SHORT TERM GOAL #1   Title Pt will be independent with initial HEP in order to improve strength and balance in order to decrease fall risk and improve function at home and work.    Baseline 02/23/2022- Patient reports not doing much as far as exercise in the home currently. 04/20/2022- Patient reports compliant with HEP and no questions at this time.; 9/7 pt indep   Time 6    Period Weeks    Status Goal met   Target Date 04/06/22              PT Long Term Goals       PT LONG TERM GOAL #1   Title Pt will be independent with final HEP in order to improve strength and balance in order to decrease fall risk and improve function at home and work. 04/20/2022- Patient reports compliant with  HEP and no questions at this time.    Baseline 02/23/2022= No formal HEP in place. 9/7: pt performing at least once a day, feels comfortable   Time 12    Period Weeks    Status GOAL MET   Target Date 08/11/2022  PT LONG TERM GOAL #2   Title Pt will decrease 5TSTS by at least 8 seconds in order to demonstrate clinically significant improvement in LE strength.    Baseline 02/23/2022= 31.52 sec with B hands on knees; 04/20/2022= 16.08 sec with B hands on knees   Time 12    Period Days    Status Goal Met   Target Date 05/18/22      PT LONG TERM GOAL #3   Title Pt will increase 10MWT by at least 0.23 m/s in order to demonstrate clinically significant improvement in community ambulation.    Baseline 02/23/2022= 0.39 m/s using RW; 0.45 m/s; 9/7: 0.37 m/s with QC, 0.74 m/s with RW   Time 12    Period Weeks    Status GOAL MET   Target Date 05/18/22      PT LONG TERM GOAL #4   Title Pt will improve FOTO to target score of 50 to display perceived improvements in ability to complete ADL's.    Baseline 02/23/2022= 47; 9/7: 49; 11/30=54%   Time 12    Period Weeks    Status GOAL MET   Target Date 08/11/2022      PT LONG TERM GOAL #5   Title Pt will decrease TUG to below 20 seconds/decrease in order to demonstrate decreased fall risk.    Baseline 02/23/2022=28.27 sec with RW; 04/20/2022= 20.22 with SBQC; 9/7: 14.4 sec with RW, 18.5 sec with SBQC   Time 12    Period Weeks    Status MET   Target Date 05/18/22    PT LONG TERM GOAL #6  Title Pt will decrease TUG to 14 sec or below with SBQC in order to demonstrate decreased fall risk.   Baseline  9/7: 14.4 sec with RW, 18.5 sec with SBQC; 11/30=16.85 sec without an AD. 09/15/2022= 22.14 sec with SPC; 11/08/2022= 25.17 sec without and AD and 21.22 sec with SPC  Time 12   Period Weeks   Status ONGOING  Target Date 01/31/2023   PT LONG TERM GOAL #7  Title Patient will increase six minute walk test distance to >700 ft for improved gait ability and  increased ease with community participation.  Baseline 9/7: completes 4 minutes and 444 ft with RW. Test discontinued at 4 minutes due to [place]. 08/11/2022= patient ambulated 200 feet yet required 1 seated rest break- due to BLE fatigue- using SBQC: 09/15/2022= Patient ambulated 300 feet in 4 min 20 sec with 1 seated rest break using SBQC. 11/08/2022 = 175 feet- stopped due to exhausted today- 2 min 10 sec  Time 12   Period Weeks   Status ONGOING  Target Date 01/31/2023   PT LONG TERM GOAL #8  Title Pt will decrease 5TSTS by at least 2 seconds (without UE support)  in order to demonstrate clinically significant improvement in LE strength.   Baseline 08/11/2022= 16.33 sec with B hands on knees; 09/15/2022= 22 sec with B hands on knees (patient reports increased weakness since around Christmas and just has not bounced back); 11/08/2022= 18.85 without UE Support  Time 12   Period Weeks  Status ONGOING  Target Date 01/31/2023       PT LONG TERM GOAL #  Title Pt will improve BERG by at least 3 points in order to demonstrate clinically significant improvement in balance.   Baseline 11/08/2022= 33/56  Time 12   Period Weeks  Status NEW  Target Date 01/31/2023       Plan -  Clinical Impression Statement Session shortened due to low energy levels today also endorsing need of volunteer services to assist bringing pt to lobby after session as they are off work at 4 pm. Despite low energy levels pt able to perform x2 walking bouts at a further distance than last session and standing postural strengthening/balance activities in setting of parkinsonian deficits without physical support or UE use. Encouraged AD use at home for safety and energy conservation. Pt will continue to benefit from skilled PT services to address deficits and impairment identified in evaluation in order to maximize independence and safety in basic mobility required for performance of ADL, IADL, and leisure.    Personal Factors and  Comorbidities Age;Past/Current Experience;Time since onset of injury/illness/exacerbation;Comorbidity 3+    Comorbidities COPD, History of Cancer, myasthenia gravis, chronic lumbar surgical history    Examination-Activity Limitations Lift;Squat;Bend;Stairs;Stand;Transfers;Insurance claims handler;Bathing;Hygiene/Grooming;Dressing;Toileting;Bed Mobility;Caring for Others;Continence    Examination-Participation Restrictions Yard Work;Church;Cleaning;Driving;Community Activity    Stability/Clinical Decision Making Evolving/Moderate complexity    Rehab Potential Good    Clinical Impairments Affecting Rehab Potential multipe comorbidities    PT Frequency 1-2x / week    PT Duration 12 weeks    PT Treatment/Interventions ADLs/Self Care Home Management;Electrical Stimulation;Moist Heat;Traction;Ultrasound;Gait training;Stair training;Functional mobility training;Therapeutic activities;Therapeutic exercise;Balance training;Neuromuscular re-education;Patient/family education;Manual techniques;Passive range of motion;Dry needling;Joint Manipulations;Cryotherapy;Vestibular;Canalith Repostioning    PT Next Visit Plan standing strengthening and balance, seated strengthening and coordination    PT Home Exercise Plan Progress LE strengthening, balance training,  and functional endurance   Consulted and Agree with Plan of Care Patient              Salem Caster. Fairly IV, PT, DPT Physical Therapist- [facility]  11/22/22, 4:11 PM

## 2022-11-24 ENCOUNTER — Ambulatory Visit: Payer: Medicare Other

## 2022-11-24 ENCOUNTER — Other Ambulatory Visit: Payer: Self-pay | Admitting: Vascular Surgery

## 2022-11-25 ENCOUNTER — Ambulatory Visit: Payer: Medicare Other

## 2022-11-29 ENCOUNTER — Ambulatory Visit: Payer: Medicare Other

## 2022-11-29 DIAGNOSIS — R269 Unspecified abnormalities of gait and mobility: Secondary | ICD-10-CM

## 2022-11-29 DIAGNOSIS — R2689 Other abnormalities of gait and mobility: Secondary | ICD-10-CM

## 2022-11-29 DIAGNOSIS — R2681 Unsteadiness on feet: Secondary | ICD-10-CM

## 2022-11-29 DIAGNOSIS — G959 Disease of spinal cord, unspecified: Secondary | ICD-10-CM

## 2022-11-29 DIAGNOSIS — G8929 Other chronic pain: Secondary | ICD-10-CM

## 2022-11-29 DIAGNOSIS — R262 Difficulty in walking, not elsewhere classified: Secondary | ICD-10-CM

## 2022-11-29 DIAGNOSIS — R278 Other lack of coordination: Secondary | ICD-10-CM

## 2022-11-29 DIAGNOSIS — M545 Low back pain, unspecified: Secondary | ICD-10-CM

## 2022-11-29 DIAGNOSIS — M6281 Muscle weakness (generalized): Secondary | ICD-10-CM

## 2022-11-29 DIAGNOSIS — R296 Repeated falls: Secondary | ICD-10-CM

## 2022-11-29 NOTE — Therapy (Signed)
OUTPATIENT PHYSICAL THERAPY TREATMENT NOTE     Patient Name: Francisco [name] MRN: [id number] DOB:[birth year], 77 y.o., male Today's Date: 11/29/2022  PCP: Adin Hector, MD REFERRING PROVIDER: Meade Maw MD   PT End of Session - 11/29/22 1525     Visit Number 23    Number of Visits 74    Date for PT Re-Evaluation 01/31/23    Authorization Type UHC Medicare    Authorization Time Period 08/11/22-11/03/2022    Progress Note Due on Visit 13    PT Start Time 1517    PT Stop Time 1600    PT Time Calculation (min) 43 min    Equipment Utilized During Treatment Gait belt    Activity Tolerance Patient tolerated treatment well;No increased pain;Patient limited by fatigue    Behavior During Therapy [facility] for tasks assessed/performed                       Past Medical History:  Diagnosis Date   Arthritis    lower left hip   Atypical angina    Bilateral hand numbness    from back surgery   Bronchitis, chronic (HCC)    Cancer ([place])    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; Merkel cell cancer, and Basal cell cancer (twice; 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   Carotid stenosis    CHF (congestive heart failure) (HCC)    CKD (chronic kidney disease)    CKD (chronic kidney disease) stage 3, GFR 30-59 ml/min (HCC)    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   DDD (degenerative disc disease), cervical    Dysrhythmia    post carotid stent bradycardia; PAF 09/2020   GERD (gastroesophageal reflux disease)    Hypercholesterolemia    Hypertension    Hypothyroidism    pt takes Levothyroxine daily   Lumbosacral spinal stenosis    Myasthenia gravis, adult form ([place])    PAD (peripheral artery disease) ([place])    Shortness of breath    Lung MD- Dr Darlin Coco   Sleep apnea    do not use CPAP every night   Past Surgical History:  Procedure Laterality Date   ANTERIOR CERVICAL DECOMP/DISCECTOMY FUSION  07/18/2011   Procedure: ANTERIOR CERVICAL DECOMPRESSION/DISCECTOMY  FUSION 2 LEVELS;  Surgeon: Cooper Render Pool;  Location: [place] [facility];  Service: Neurosurgery;  Laterality: N/A;  cervical five-six, cervical six-seven anterior cervical discectomy and fusion   BACK SURGERY     in [place]     01/2020 Right, 04/2020 Left   CARDIAC CATHETERIZATION     2005 at [facility], no stents   CAROTID PTA/STENT INTERVENTION N/A 09/17/2020   Procedure: CAROTID PTA/STENT INTERVENTION;  Surgeon: Algernon Huxley, MD;  Location: [place] CV LAB;  Service: Cardiovascular;  Laterality: N/A;   CATARACT EXTRACTION W/PHACO Left 01/06/2020   Procedure: CATARACT EXTRACTION PHACO AND INTRAOCULAR LENS PLACEMENT (IOC) ISTENT INJ LEFT 3.81  00:33.3;  Surgeon: Eulogio Bear, MD;  Location: [place];  Service: Ophthalmology;  Laterality: Left;   CATARACT EXTRACTION W/PHACO Right 02/03/2020   Procedure: CATARACT EXTRACTION PHACO AND INTRAOCULAR LENS PLACEMENT ([place]) RIGHT ISTENT INJ;  Surgeon: Eulogio Bear, MD;  Location: [place];  Service: Ophthalmology;  Laterality: Right;  4.29 0:35.6   COLONOSCOPY     HERNIA REPAIR Left    inguinal hernia repair in [place] MICRODISCECTOMY Left 02/24/2014   Procedure: LUMBAR LAMINECTOMY/DECOMPRESSION  MICRODISCECTOMY LUMBAR THREE-FOUR, FOUR-FIVE, LEFT FIVE-SACRAL ONE ;  Surgeon: Charlie Pitter, MD;  Location: [facility];  Service: Neurosurgery;  Laterality: Left;  LUMBAR LAMINECTOMY/DECOMPRESSION MICRODISCECTOMY LUMBAR THREE-FOUR, FOUR-FIVE, LEFT FIVE-SACRAL ONE    LUMBAR LAMINECTOMY/DECOMPRESSION MICRODISCECTOMY N/A 05/03/2021   Procedure: Laminectomy and Foraminotomy - L2-L3;  Surgeon: Earnie Larsson, MD;  Location: [place];  Service: Neurosurgery;  Laterality: N/A;  3C   POSTERIOR CERVICAL FUSION/FORAMINOTOMY N/A 08/07/2020   Procedure: C3-6 POSTERIOR FUSION WITH DECOMPRESSION;  Surgeon: Meade Maw, MD;  Location: [facility] ORS;  Service: Neurosurgery;  Laterality:  N/A;   PROSTATECTOMY  8/14   [facility] Dr Mare Ferrari    Patient Active Problem List   Diagnosis Date Noted   SVT (supraventricular tachycardia) 12/30/2021   Hypothyroidism 12/30/2021   Parkinson's disease 12/30/2021   Elevated troponin 12/30/2021   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   Acquired thrombophilia ([place]) 01/13/2021   History of decompression of median nerve 01/01/2021   Paroxysmal atrial fibrillation ([place]) 10/06/2020   Carotid stenosis, right 09/17/2020   S/P cervical spinal fusion    Leukocytosis    Essential hypertension    Anemia of chronic disease    Postoperative pain    Neuropathic pain    Cervical myelopathy ([place]) 08/07/2020   Preop cardiovascular exam 07/17/2020   SOB (shortness of breath) on exertion 07/17/2020   Leg weakness, bilateral 07/13/2020   Aortic atherosclerosis ([place]) 07/09/2020   Body mass index (BMI) 34.0-34.9, adult 12/25/2019   Myalgia 10/30/2019   Lumbar post-laminectomy syndrome 10/24/2019   PAD (peripheral artery disease) ([place]) 06/06/2019   CKD (chronic kidney disease) stage 3, GFR 30-59 ml/min (HCC) 04/29/2019   B12 deficiency 01/23/2019   Left arm numbness 02/28/2018   Neck pain 02/28/2018   Anemia 02/02/2017   DDD (degenerative disc disease), cervical 02/02/2017   Hypothyroid 02/02/2017   MRSA (methicillin resistant staph aureus) culture positive 02/02/2017   Nocturnal hypoxia 02/02/2017   Senile purpura ([place]) 10/03/2016   Essential hypertension, benign 09/16/2016   Bilateral carotid artery disease ([place]) 09/16/2016   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/18/2016   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   History of prostate cancer 12/21/2015   Left carpal tunnel syndrome 11/11/2015   Kidney stone on left side 07/05/2015   Myasthenia gravis ([place]) 05/26/2015   Radiculitis 01/05/2015   Long-term use of high-risk medication 10/15/2014   Persistent cough 09/10/2014   Pure hypercholesterolemia 07/25/2014   Spinal  stenosis, lumbar region, with neurogenic claudication 02/24/2014   Lumbosacral stenosis with neurogenic claudication 02/24/2014   COPD (chronic obstructive pulmonary disease) ([place]) 01/22/2014    REFERRING DIAG: balance disorder   THERAPY DIAG:  Difficulty in walking, not elsewhere classified  Muscle weakness (generalized)  Unsteadiness on feet  Other abnormalities of gait and mobility  Chronic bilateral low back pain, unspecified whether sciatica present  Other lack of coordination  Repeated falls  Cervical myelopathy (HCC)  Abnormality of gait and mobility  Chronic left-sided low back pain with left-sided sciatica  Rationale for Evaluation and Treatment Rehabilitation  PERTINENT HISTORY: Senay Capozza is a 77 year old male referred to OPPT neuro for difficulty with balance. He was also just recently dx in May 2023 with Impingement syndrome of left shoulder (M75.42) Impingement syndrome of left shoulder (primary encounter diagnosis) Left shoulder pain, unspecified chronicity Nontraumatic rupture of left proximal biceps tendon Past medical hx includes Lumbar laminectomy 04/2021. C3-6 decompression, fusion on 11/26, myasthenia gravis, 2012 ACDF 5/6, 6/7; OSA, hypothyroidism, HTN, COPD, multiple back  surgeries, 2021 carpal tunnel release.  PRECAUTIONS: Fall  SUBJECTIVE:  Patient reports continuing to have more weak days with knee buckling some.    PAIN:  Are you having pain? None reported  TODAY'S TREATMENT: 11/29/22   Therex:   Warm- up - LAQ (AROM) x 12 reps   Sit to stand without UE support 2 sets x 15 reps   Dynamic high knee marching in // bars - down and back x 1  Dynamic side step- VC to take wide step as able - (down and back in // bars- 5-6 steps each direction)   Dynamic walking in // bars - attempting reciprocal steps- down and back x 3  Dynamic retro walking in // bars - down and back x 2   Standing [place] dead lift with 5#DB x 8 reps (patient  reports as challenging)   Standing near tandem walking in // bars without UE support down and back x 2   Gait in gym/hallway- approx 100 feet then another 60 feet gait holding SBQC initially- and reverting to using the cane per PT recommendation, with fair dynamic standing - unsteady yet no knee buckling- but immediately improves the moment he begans to use the cane. CGA. Step to pattern.  Pt educated throughout session about proper posture and technique with exercises. Improved exercise technique, movement at target joints, use of target muscles after min to mod verbal, visual, tactile cues.   PATIENT EDUCATION: Education details: Goals, plan, exercise technique, body mechanics, energy conservation techniques  Person educated: Patient Education method: Explanation, Demonstration, Tactile cues, Verbal cues, and Handouts Education comprehension: verbalized understanding, returned demonstration, verbal cues required, and tactile cues required   HOME EXERCISE PROGRAM:  No Updates today  Access Code: [email or web address] URL: [email or web address] Date: 03/01/2022 Prepared by: Janna Arch  Exercises - Seated March  - 1 x daily - 7 x weekly - 2 sets - 10 reps - 5 hold - Seated Long Arc Quad  - 1 x daily - 7 x weekly - 2 sets - 10 reps - 5 hold - Seated Hip Abduction  - 1 x daily - 7 x weekly - 2 sets - 10 reps - 5 hold - Seated Hip Adduction Isometrics with Ball  - 1 x daily - 7 x weekly - 2 sets - 10 reps - 5 hold - Seated Gluteal Sets  - 1 x daily - 7 x weekly - 2 sets - 10 reps - 5 hold - Seated Heel Toe Raises  - 1 x daily - 7 x weekly - 2 sets - 10 reps - 5 hold LE strength and balance         PT Short Term Goals       PT SHORT TERM GOAL #1   Title Pt will be independent with initial HEP in order to improve strength and balance in order to decrease fall risk and improve function at home and work.    Baseline 02/23/2022- Patient reports not doing much as far as exercise in  the home currently. 04/20/2022- Patient reports compliant with HEP and no questions at this time.; 9/7 pt indep   Time 6    Period Weeks    Status Goal met   Target Date 04/06/22              PT Long Term Goals       PT LONG TERM GOAL #1   Title Pt will be independent with final HEP in order to improve strength and  balance in order to decrease fall risk and improve function at home and work. 04/20/2022- Patient reports compliant with HEP and no questions at this time.    Baseline 02/23/2022= No formal HEP in place. 9/7: pt performing at least once a day, feels comfortable   Time 12    Period Weeks    Status GOAL MET   Target Date 08/11/2022      PT LONG TERM GOAL #2   Title Pt will decrease 5TSTS by at least 8 seconds in order to demonstrate clinically significant improvement in LE strength.    Baseline 02/23/2022= 31.52 sec with B hands on knees; 04/20/2022= 16.08 sec with B hands on knees   Time 12    Period Days    Status Goal Met   Target Date 05/18/22      PT LONG TERM GOAL #3   Title Pt will increase 10MWT by at least 0.23 m/s in order to demonstrate clinically significant improvement in community ambulation.    Baseline 02/23/2022= 0.39 m/s using RW; 0.45 m/s; 9/7: 0.37 m/s with QC, 0.74 m/s with RW   Time 12    Period Weeks    Status GOAL MET   Target Date 05/18/22      PT LONG TERM GOAL #4   Title Pt will improve FOTO to target score of 50 to display perceived improvements in ability to complete ADL's.    Baseline 02/23/2022= 47; 9/7: 49; 11/30=54%   Time 12    Period Weeks    Status GOAL MET   Target Date 08/11/2022      PT LONG TERM GOAL #5   Title Pt will decrease TUG to below 20 seconds/decrease in order to demonstrate decreased fall risk.    Baseline 02/23/2022=28.27 sec with RW; 04/20/2022= 20.22 with SBQC; 9/7: 14.4 sec with RW, 18.5 sec with SBQC   Time 12    Period Weeks    Status MET   Target Date 05/18/22    PT LONG TERM GOAL #6  Title Pt will decrease  TUG to 14 sec or below with SBQC in order to demonstrate decreased fall risk.   Baseline  9/7: 14.4 sec with RW, 18.5 sec with SBQC; 11/30=16.85 sec without an AD. 09/15/2022= 22.14 sec with SPC; 11/08/2022= 25.17 sec without and AD and 21.22 sec with SPC  Time 12   Period Weeks   Status ONGOING  Target Date 01/31/2023   PT LONG TERM GOAL #7  Title Patient will increase six minute walk test distance to >700 ft for improved gait ability and increased ease with community participation.  Baseline 9/7: completes 4 minutes and 444 ft with RW. Test discontinued at 4 minutes due to [place]. 08/11/2022= patient ambulated 200 feet yet required 1 seated rest break- due to BLE fatigue- using SBQC: 09/15/2022= Patient ambulated 300 feet in 4 min 20 sec with 1 seated rest break using SBQC. 11/08/2022 = 175 feet- stopped due to exhausted today- 2 min 10 sec  Time 12   Period Weeks   Status ONGOING  Target Date 01/31/2023   PT LONG TERM GOAL #8  Title Pt will decrease 5TSTS by at least 2 seconds (without UE support)  in order to demonstrate clinically significant improvement in LE strength.   Baseline 08/11/2022= 16.33 sec with B hands on knees; 09/15/2022= 22 sec with B hands on knees (patient reports increased weakness since around Christmas and just has not bounced back); 11/08/2022= 18.85 without UE Support  Time 12  Period Weeks  Status ONGOING  Target Date 01/31/2023       PT LONG TERM GOAL #  Title Pt will improve BERG by at least 3 points in order to demonstrate clinically significant improvement in balance.   Baseline 11/08/2022= 33/56  Time 12   Period Weeks  Status NEW  Target Date 01/31/2023       Plan -     Clinical Impression Statement Patient cautious of left LE - concerned about buckling due to weakness yet responded well overall today. He was able to complete all standing therex with minimal use of UE support and challenged with deadlift yet no pain. Continued to encourage use of AD for all  ambulation and discussed that it is not a step back in progress if he is able to increase his distance and stamina but patient just states he really wants to be able to walk without a device. Will continue to encourage AD use at home for safety and energy conservation. Pt will continue to benefit from skilled PT services to address deficits and impairment identified in evaluation in order to maximize independence and safety in basic mobility required for performance of ADL, IADL, and leisure.    Personal Factors and Comorbidities Age;Past/Current Experience;Time since onset of injury/illness/exacerbation;Comorbidity 3+    Comorbidities COPD, History of Cancer, myasthenia gravis, chronic lumbar surgical history    Examination-Activity Limitations Lift;Squat;Bend;Stairs;Stand;Transfers;Insurance claims handler;Bathing;Hygiene/Grooming;Dressing;Toileting;Bed Mobility;Caring for Others;Continence    Examination-Participation Restrictions Yard Work;Church;Cleaning;Driving;Community Activity    Stability/Clinical Decision Making Evolving/Moderate complexity    Rehab Potential Good    Clinical Impairments Affecting Rehab Potential multipe comorbidities    PT Frequency 1-2x / week    PT Duration 12 weeks    PT Treatment/Interventions ADLs/Self Care Home Management;Electrical Stimulation;Moist Heat;Traction;Ultrasound;Gait training;Stair training;Functional mobility training;Therapeutic activities;Therapeutic exercise;Balance training;Neuromuscular re-education;Patient/family education;Manual techniques;Passive range of motion;Dry needling;Joint Manipulations;Cryotherapy;Vestibular;Canalith Repostioning    PT Next Visit Plan standing strengthening and balance, seated strengthening and coordination    PT Home Exercise Plan Progress LE strengthening, balance training,  and functional endurance   Consulted and Agree with Plan of Care Patient              Ollen Bowl, PT Physical  Therapist- [facility]  11/29/22, 5:08 PM

## 2022-12-01 ENCOUNTER — Ambulatory Visit: Payer: Medicare Other

## 2022-12-01 DIAGNOSIS — R2689 Other abnormalities of gait and mobility: Secondary | ICD-10-CM

## 2022-12-01 DIAGNOSIS — R296 Repeated falls: Secondary | ICD-10-CM

## 2022-12-01 DIAGNOSIS — M6281 Muscle weakness (generalized): Secondary | ICD-10-CM

## 2022-12-01 DIAGNOSIS — R262 Difficulty in walking, not elsewhere classified: Secondary | ICD-10-CM

## 2022-12-01 DIAGNOSIS — R278 Other lack of coordination: Secondary | ICD-10-CM

## 2022-12-01 DIAGNOSIS — M545 Low back pain, unspecified: Secondary | ICD-10-CM

## 2022-12-01 DIAGNOSIS — R2681 Unsteadiness on feet: Secondary | ICD-10-CM

## 2022-12-01 NOTE — Therapy (Signed)
OUTPATIENT PHYSICAL THERAPY TREATMENT NOTE     Patient Name: Francisco [name] MRN: [id number] DOB:[birth year], 77 y.o., male Today's Date: 12/01/2022  PCP: Adin Hector, MD REFERRING PROVIDER: Meade Maw MD   PT End of Session - 12/01/22 1520     Visit Number 55    Number of Visits 62    Date for PT Re-Evaluation 01/31/23    Authorization Type U[employer] Medicare    Authorization Time Period 08/11/22-11/03/2022    Progress Note Due on Visit 39    PT Start Time [id number]    PT Stop Time 1557    PT Time Calculation (min) 41 min    Equipment Utilized During Treatment Gait belt    Activity Tolerance Patient tolerated treatment well;No increased pain;Patient limited by fatigue    Behavior During Therapy [facility] for tasks assessed/performed                 Past Medical History:  Diagnosis Date   Arthritis    lower left hip   Atypical angina    Bilateral hand numbness    from back surgery   Bronchitis, chronic (HCC)    Cancer ([place])    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; Merkel cell cancer, and Basal cell cancer (twice; 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   Carotid stenosis    CHF (congestive heart failure) (HCC)    CKD (chronic kidney disease)    CKD (chronic kidney disease) stage 3, GFR 30-59 ml/min (HCC)    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   DDD (degenerative disc disease), cervical    Dysrhythmia    post carotid stent bradycardia; PAF 09/2020   GERD (gastroesophageal reflux disease)    Hypercholesterolemia    Hypertension    Hypothyroidism    pt takes Levothyroxine daily   Lumbosacral spinal stenosis    Myasthenia gravis, adult form ([place])    PAD (peripheral artery disease) ([place])    Shortness of breath    Lung MD- Dr Darlin Coco   Sleep apnea    do not use CPAP every night   Past Surgical History:  Procedure Laterality Date   ANTERIOR CERVICAL DECOMP/DISCECTOMY FUSION  07/18/2011   Procedure: ANTERIOR CERVICAL DECOMPRESSION/DISCECTOMY FUSION 2 LEVELS;   Surgeon: Cooper Render Pool;  Location: [facility];  Service: Neurosurgery;  Laterality: N/A;  cervical five-six, cervical six-seven anterior cervical discectomy and fusion   BACK SURGERY     in [place]     01/2020 Right, 04/2020 Left   CARDIAC CATHETERIZATION     2005 at [facility], no stents   CAROTID PTA/STENT INTERVENTION N/A 09/17/2020   Procedure: CAROTID PTA/STENT INTERVENTION;  Surgeon: Algernon Huxley, MD;  Location: [facility];  Service: Cardiovascular;  Laterality: N/A;   CATARACT EXTRACTION W/PHACO Left 01/06/2020   Procedure: CATARACT EXTRACTION PHACO AND INTRAOCULAR LENS PLACEMENT (IOC) ISTENT INJ LEFT 3.81  00:33.3;  Surgeon: Eulogio Bear, MD;  Location: [facility];  Service: Ophthalmology;  Laterality: Left;   CATARACT EXTRACTION W/PHACO Right 02/03/2020   Procedure: CATARACT EXTRACTION PHACO AND INTRAOCULAR LENS PLACEMENT ([place]) RIGHT ISTENT INJ;  Surgeon: Eulogio Bear, MD;  Location: [facility];  Service: Ophthalmology;  Laterality: Right;  4.29 0:35.6   COLONOSCOPY     HERNIA REPAIR Left    inguinal hernia repair in [place] MICRODISCECTOMY Left 02/24/2014   Procedure: LUMBAR LAMINECTOMY/DECOMPRESSION MICRODISCECTOMY LUMBAR THREE-FOUR, FOUR-FIVE, LEFT FIVE-SACRAL  ONE ;  Surgeon: Charlie Pitter, MD;  Location: [facility];  Service: Neurosurgery;  Laterality: Left;  LUMBAR LAMINECTOMY/DECOMPRESSION MICRODISCECTOMY LUMBAR THREE-FOUR, FOUR-FIVE, LEFT FIVE-SACRAL ONE    LUMBAR LAMINECTOMY/DECOMPRESSION MICRODISCECTOMY N/A 05/03/2021   Procedure: Laminectomy and Foraminotomy - L2-L3;  Surgeon: Earnie Larsson, MD;  Location: [facility];  Service: Neurosurgery;  Laterality: N/A;  3C   POSTERIOR CERVICAL FUSION/FORAMINOTOMY N/A 08/07/2020   Procedure: C3-6 POSTERIOR FUSION WITH DECOMPRESSION;  Surgeon: Meade Maw, MD;  Location: [facility];  Service: Neurosurgery;  Laterality: N/A;    PROSTATECTOMY  8/14   [facility] Dr Mare Ferrari    Patient Active Problem List   Diagnosis Date Noted   SVT (supraventricular tachycardia) 12/30/2021   Hypothyroidism 12/30/2021   Parkinson's disease 12/30/2021   Elevated troponin 12/30/2021   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   Acquired thrombophilia ([place]) 01/13/2021   History of decompression of median nerve 01/01/2021   Paroxysmal atrial fibrillation ([place]) 10/06/2020   Carotid stenosis, right 09/17/2020   S/P cervical spinal fusion    Leukocytosis    Essential hypertension    Anemia of chronic disease    Postoperative pain    Neuropathic pain    Cervical myelopathy ([place]) 08/07/2020   Preop cardiovascular exam 07/17/2020   SOB (shortness of breath) on exertion 07/17/2020   Leg weakness, bilateral 07/13/2020   Aortic atherosclerosis ([place]) 07/09/2020   Body mass index (BMI) 34.0-34.9, adult 12/25/2019   Myalgia 10/30/2019   Lumbar post-laminectomy syndrome 10/24/2019   PAD (peripheral artery disease) ([place]) 06/06/2019   CKD (chronic kidney disease) stage 3, GFR 30-59 ml/min (HCC) 04/29/2019   B12 deficiency 01/23/2019   Left arm numbness 02/28/2018   Neck pain 02/28/2018   Anemia 02/02/2017   DDD (degenerative disc disease), cervical 02/02/2017   Hypothyroid 02/02/2017   MRSA (methicillin resistant staph aureus) culture positive 02/02/2017   Nocturnal hypoxia 02/02/2017   Senile purpura ([place]) 10/03/2016   Essential hypertension, benign 09/16/2016   Bilateral carotid artery disease ([place]) 09/16/2016   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/18/2016   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   History of prostate cancer 12/21/2015   Left carpal tunnel syndrome 11/11/2015   Kidney stone on left side 07/05/2015   Myasthenia gravis ([place]) 05/26/2015   Radiculitis 01/05/2015   Long-term use of high-risk medication 10/15/2014   Persistent cough 09/10/2014   Pure hypercholesterolemia 07/25/2014   Spinal stenosis,  lumbar region, with neurogenic claudication 02/24/2014   Lumbosacral stenosis with neurogenic claudication 02/24/2014   COPD (chronic obstructive pulmonary disease) ([place]) 01/22/2014    REFERRING DIAG: balance disorder   THERAPY DIAG:  Difficulty in walking, not elsewhere classified  Muscle weakness (generalized)  Unsteadiness on feet  Other abnormalities of gait and mobility  Chronic bilateral low back pain, unspecified whether sciatica present  Other lack of coordination  Repeated falls  Rationale for Evaluation and Treatment Rehabilitation  PERTINENT HISTORY: Manson Luckadoo is a 77 year old male referred to OPPT neuro for difficulty with balance. He was also just recently dx in May 2023 with Impingement syndrome of left shoulder (M75.42) Impingement syndrome of left shoulder (primary encounter diagnosis) Left shoulder pain, unspecified chronicity Nontraumatic rupture of left proximal biceps tendon Past medical hx includes Lumbar laminectomy 04/2021. C3-6 decompression, fusion on 11/26, myasthenia gravis, 2012 ACDF 5/6, 6/7; OSA, hypothyroidism, HTN, COPD, multiple back surgeries, 2021 carpal tunnel release.  PRECAUTIONS: Fall  SUBJECTIVE:  Patient reports feeling better overall today and states no particular pain- denies falls- Reports  hopeful to go out to dinner later today with his wife and daughters.  PAIN:  Are you having pain? None reported  TODAY'S TREATMENT: 12/01/22   Therex:   Warm- up - nustep- L1-4 for total of 6 min - VC to keep SPM >50  Total distance = 0.2 mi  Seated LE knee ext  using matrix cable system-  2.5# 2 sets of 10 reps  Seated ham curl with 7.5 on right and 4.5 on Left LE- 2 sets of 10 reps  Standing scap row with 9.5# 2 sets of 10 reps   Dynamic walking in x 350 feet total with 3 seated rest breaks- CGA with gait belt and mostly patient holding onto his [facility] today with occasional use- complaining more today of left hip soreness with  gait.   Pt educated throughout session about proper posture and technique with exercises. Improved exercise technique, movement at target joints, use of target muscles after min to mod verbal, visual, tactile cues.   PATIENT EDUCATION: Education details: Goals, plan, exercise technique, body mechanics, energy conservation techniques  Person educated: Patient Education method: Explanation, Demonstration, Tactile cues, Verbal cues, and Handouts Education comprehension: verbalized understanding, returned demonstration, verbal cues required, and tactile cues required   HOME EXERCISE PROGRAM:  No Updates today  Access Code: [email or web address] URL: [email or web address] Date: 03/01/2022 Prepared by: Janna Arch  Exercises - Seated March  - 1 x daily - 7 x weekly - 2 sets - 10 reps - 5 hold - Seated Long Arc Quad  - 1 x daily - 7 x weekly - 2 sets - 10 reps - 5 hold - Seated Hip Abduction  - 1 x daily - 7 x weekly - 2 sets - 10 reps - 5 hold - Seated Hip Adduction Isometrics with Ball  - 1 x daily - 7 x weekly - 2 sets - 10 reps - 5 hold - Seated Gluteal Sets  - 1 x daily - 7 x weekly - 2 sets - 10 reps - 5 hold - Seated Heel Toe Raises  - 1 x daily - 7 x weekly - 2 sets - 10 reps - 5 hold LE strength and balance         PT Short Term Goals       PT SHORT TERM GOAL #1   Title Pt will be independent with initial HEP in order to improve strength and balance in order to decrease fall risk and improve function at home and work.    Baseline 02/23/2022- Patient reports not doing much as far as exercise in the home currently. 04/20/2022- Patient reports compliant with HEP and no questions at this time.; 9/7 pt indep   Time 6    Period Weeks    Status Goal met   Target Date 04/06/22              PT Long Term Goals       PT LONG TERM GOAL #1   Title Pt will be independent with final HEP in order to improve strength and balance in order to decrease fall risk and improve  function at home and work. 04/20/2022- Patient reports compliant with HEP and no questions at this time.    Baseline 02/23/2022= No formal HEP in place. 9/7: pt performing at least once a day, feels comfortable   Time 12    Period Weeks    Status GOAL MET   Target Date 08/11/2022      PT LONG TERM  GOAL #2   Title Pt will decrease 5TSTS by at least 8 seconds in order to demonstrate clinically significant improvement in LE strength.    Baseline 02/23/2022= 31.52 sec with B hands on knees; 04/20/2022= 16.08 sec with B hands on knees   Time 12    Period Days    Status Goal Met   Target Date 05/18/22      PT LONG TERM GOAL #3   Title Pt will increase 10MWT by at least 0.23 m/s in order to demonstrate clinically significant improvement in community ambulation.    Baseline 02/23/2022= 0.39 m/s using RW; 0.45 m/s; 9/7: 0.37 m/s with QC, 0.74 m/s with RW   Time 12    Period Weeks    Status GOAL MET   Target Date 05/18/22      PT LONG TERM GOAL #4   Title Pt will improve FOTO to target score of 50 to display perceived improvements in ability to complete ADL's.    Baseline 02/23/2022= 47; 9/7: 49; 11/30=54%   Time 12    Period Weeks    Status GOAL MET   Target Date 08/11/2022      PT LONG TERM GOAL #5   Title Pt will decrease TUG to below 20 seconds/decrease in order to demonstrate decreased fall risk.    Baseline 02/23/2022=28.27 sec with RW; 04/20/2022= 20.22 with SBQC; 9/7: 14.4 sec with RW, 18.5 sec with SBQC   Time 12    Period Weeks    Status MET   Target Date 05/18/22    PT LONG TERM GOAL #6  Title Pt will decrease TUG to 14 sec or below with SBQC in order to demonstrate decreased fall risk.   Baseline  9/7: 14.4 sec with RW, 18.5 sec with SBQC; 11/30=16.85 sec without an AD. 09/15/2022= 22.14 sec with SPC; 11/08/2022= 25.17 sec without and AD and 21.22 sec with SPC  Time 12   Period Weeks   Status ONGOING  Target Date 01/31/2023   PT LONG TERM GOAL #7  Title Patient will increase six  minute walk test distance to >700 ft for improved gait ability and increased ease with community participation.  Baseline 9/7: completes 4 minutes and 444 ft with RW. Test discontinued at 4 minutes due to [place]. 08/11/2022= patient ambulated 200 feet yet required 1 seated rest break- due to BLE fatigue- using SBQC: 09/15/2022= Patient ambulated 300 feet in 4 min 20 sec with 1 seated rest break using SBQC. 11/08/2022 = 175 feet- stopped due to exhausted today- 2 min 10 sec  Time 12   Period Weeks   Status ONGOING  Target Date 01/31/2023   PT LONG TERM GOAL #8  Title Pt will decrease 5TSTS by at least 2 seconds (without UE support)  in order to demonstrate clinically significant improvement in LE strength.   Baseline 08/11/2022= 16.33 sec with B hands on knees; 09/15/2022= 22 sec with B hands on knees (patient reports increased weakness since around Christmas and just has not bounced back); 11/08/2022= 18.85 without UE Support  Time 12   Period Weeks  Status ONGOING  Target Date 01/31/2023       PT LONG TERM GOAL #  Title Pt will improve BERG by at least 3 points in order to demonstrate clinically significant improvement in balance.   Baseline 11/08/2022= 33/56  Time 12   Period Weeks  Status NEW  Target Date 01/31/2023       Plan -     Clinical Impression  Statement Patient presents with good participation with all therex- challenged with all matrix cable exercises due to ongoing left LE weakness. He is able to walk some today with more erect posture but limited by left hip soreness. Pt will continue to benefit from skilled PT services to address deficits and impairment identified in evaluation in order to maximize independence and safety in basic mobility required for performance of ADL, IADL, and leisure.    Personal Factors and Comorbidities Age;Past/Current Experience;Time since onset of injury/illness/exacerbation;Comorbidity 3+    Comorbidities COPD, History of Cancer, myasthenia gravis,  chronic lumbar surgical history    Examination-Activity Limitations Lift;Squat;Bend;Stairs;Stand;Transfers;Insurance claims handler;Bathing;Hygiene/Grooming;Dressing;Toileting;Bed Mobility;Caring for Others;Continence    Examination-Participation Restrictions Yard Work;Church;Cleaning;Driving;Community Activity    Stability/Clinical Decision Making Evolving/Moderate complexity    Rehab Potential Good    Clinical Impairments Affecting Rehab Potential multipe comorbidities    PT Frequency 1-2x / week    PT Duration 12 weeks    PT Treatment/Interventions ADLs/Self Care Home Management;Electrical Stimulation;Moist Heat;Traction;Ultrasound;Gait training;Stair training;Functional mobility training;Therapeutic activities;Therapeutic exercise;Balance training;Neuromuscular re-education;Patient/family education;Manual techniques;Passive range of motion;Dry needling;Joint Manipulations;Cryotherapy;Vestibular;Canalith Repostioning    PT Next Visit Plan standing strengthening and balance, seated strengthening and coordination    PT Home Exercise Plan Progress LE strengthening, balance training,  and functional endurance   Consulted and Agree with Plan of Care Patient              Ollen Bowl, PT Physical Therapist- [facility]  12/01/22, 4:42 PM

## 2022-12-02 ENCOUNTER — Other Ambulatory Visit: Payer: Self-pay | Admitting: Pulmonary Disease

## 2022-12-02 DIAGNOSIS — R0600 Dyspnea, unspecified: Secondary | ICD-10-CM

## 2022-12-06 ENCOUNTER — Other Ambulatory Visit: Payer: Self-pay

## 2022-12-06 ENCOUNTER — Emergency Department: Payer: Medicare Other

## 2022-12-06 ENCOUNTER — Emergency Department
Admission: EM | Admit: 2022-12-06 | Discharge: 2022-12-06 | Disposition: A | Discharge status: Home / Self Care | Payer: Medicare Other | Attending: Emergency Medicine | Admitting: Emergency Medicine

## 2022-12-06 ENCOUNTER — Ambulatory Visit: Payer: Medicare Other

## 2022-12-06 DIAGNOSIS — I471 Supraventricular tachycardia, unspecified: Secondary | ICD-10-CM | POA: Diagnosis not present

## 2022-12-06 DIAGNOSIS — D649 Anemia, unspecified: Secondary | ICD-10-CM | POA: Insufficient documentation

## 2022-12-06 DIAGNOSIS — R202 Paresthesia of skin: Secondary | ICD-10-CM | POA: Insufficient documentation

## 2022-12-06 DIAGNOSIS — R002 Palpitations: Secondary | ICD-10-CM | POA: Diagnosis present

## 2022-12-06 HISTORY — DX: Unspecified atrial fibrillation: I48.91

## 2022-12-06 LAB — COMPREHENSIVE METABOLIC PANEL
ALT: 7 U/L (ref 0–44)
AST: 13 U/L — ABNORMAL LOW (ref 15–41)
Albumin: 3.1 g/dL — ABNORMAL LOW (ref 3.5–5.0)
Alkaline Phosphatase: 49 U/L (ref 38–126)
Anion gap: 10 (ref 5–15)
BUN: 17 mg/dL (ref 8–23)
CO2: 30 mmol/L (ref 22–32)
Calcium: 8.6 mg/dL — ABNORMAL LOW (ref 8.9–10.3)
Chloride: 102 mmol/L (ref 98–111)
Creatinine, Ser: 0.9 mg/dL (ref 0.61–1.24)
GFR, Estimated: >60 mL/min (ref >60)
Glucose, Bld: 109 mg/dL — ABNORMAL HIGH (ref 70–99)
Potassium: 3.6 mmol/L (ref 3.5–5.1)
Sodium: 142 mmol/L (ref 135–145)
Total Bilirubin: 0.7 mg/dL (ref 0.3–1.2)
Total Protein: 5.8 g/dL — ABNORMAL LOW (ref 6.5–8.1)

## 2022-12-06 LAB — CBC WITH DIFFERENTIAL/PLATELET
Abs Immature Granulocytes: 0.02 10*3/uL (ref 0.00–0.07)
Basophils Absolute: 0 10*3/uL (ref 0.0–0.1)
Basophils Relative: 1 %
Eosinophils Absolute: 0.1 10*3/uL (ref 0.0–0.5)
Eosinophils Relative: 2 %
HCT: 31.3 % — ABNORMAL LOW (ref 39.0–52.0)
Hemoglobin: 9.7 g/dL — ABNORMAL LOW (ref 13.0–17.0)
Immature Granulocytes: 0 %
Lymphocytes Relative: 19 %
Lymphs Abs: 0.9 10*3/uL (ref 0.7–4.0)
MCH: 34.6 pg — ABNORMAL HIGH (ref 26.0–34.0)
MCHC: 31 g/dL (ref 30.0–36.0)
MCV: 111.8 fL — ABNORMAL HIGH (ref 80.0–100.0)
Monocytes Absolute: 0.7 10*3/uL (ref 0.1–1.0)
Monocytes Relative: 14 %
Neutro Abs: 3 10*3/uL (ref 1.7–7.7)
Neutrophils Relative %: 64 %
Platelets: 225 10*3/uL (ref 150–400)
RBC: 2.8 MIL/uL — ABNORMAL LOW (ref 4.22–5.81)
RDW: 14.2 % (ref 11.5–15.5)
Smear Review: NORMAL
WBC: 4.7 10*3/uL (ref 4.0–10.5)
nRBC: 0 % (ref 0.0–0.2)

## 2022-12-06 LAB — TSH: TSH: 0.985 u[IU]/mL (ref 0.350–4.500)

## 2022-12-06 LAB — MAGNESIUM: Magnesium: 1.8 mg/dL (ref 1.7–2.4)

## 2022-12-06 LAB — TROPONIN I (HIGH SENSITIVITY)
Troponin I (High Sensitivity): 10 ng/L (ref <18)
Troponin I (High Sensitivity): 14 ng/L (ref <18)

## 2022-12-06 MED ORDER — SODIUM CHLORIDE 0.9 % IV BOLUS
1000.0000 mL | Freq: Once | INTRAVENOUS | Status: AC
  Administered 2022-12-06: 1000 mL via INTRAVENOUS

## 2022-12-06 MED ORDER — IOHEXOL 350 MG/ML SOLN
75.0000 mL | Freq: Once | INTRAVENOUS | Status: AC | PRN
  Administered 2022-12-06: 75 mL via INTRAVENOUS

## 2022-12-06 NOTE — Discharge Instructions (Addendum)
Your CT scan is showing a calcified bulging disc in the C3-C4 level which could be contributing to the numbness but there are no signs of any effect on the spinal cord at this time.  You should follow-up with your primary care doctor about this and they can arrange for an MRI if needed.  The CT scan of the chest is showing a slight increase in the size of your aortic aneurysm compared to the most recent CT several years ago.  It is now 3.7 cm.  The recommendation is a repeat CT scan in 1 year which can be ordered by her primary care doctor.  Return to the ER for new, worsening, or persistent severe episodes of elevated heart rate or palpitations, chest pain, difficulty breathing, weakness or numbness, or any other new or worsening symptoms that concern you.

## 2022-12-06 NOTE — ED Triage Notes (Signed)
Patient called EMS for increased heart rate; initially rate was in the 170's, self converted after IV attempt. Patient denies chest pain, SOB; only complaint is numbness to left hand that started this morning.

## 2022-12-06 NOTE — ED Provider Notes (Signed)
[facility] Provider Note    Event Date/Time   First MD Initiated Contact with Patient 12/06/22 1344     (approximate)   History   Palpitations   HPI  GARED GAZDA is a 77 y.o. male  here with palpitations. Pt reports that earlier today he was checking his bp at home when he noticed his HR was 170s. It then went up to 200s. He says he otherwise felt "fine" and had no sx other than some mild "tingling" in his left hand. He took a half dose of his PRN metoprolol and HR went to 160s, so he called EMS. Per EMS report, pt had IV placed and converted from an SVT to NSR. He has been asymptomatic since then. Denies any recent fever, chills, illnesses, or medication changes.       Physical Exam   Triage Vital Signs: ED Triage Vitals  Enc Vitals Group     BP 12/06/22 1336 127/75     Pulse Rate 12/06/22 1335 75     Resp 12/06/22 1335 20     Temp 12/06/22 1337 98 F (36.7 C)     Temp Source 12/06/22 1337 Oral     SpO2 12/06/22 1335 97 %     Weight 12/06/22 1334 233 lb (105.7 kg)     Height 12/06/22 1334 5\' 10"  (1.778 m)     Head Circumference --      Peak Flow --      Pain Score --      Pain Loc --      Pain Edu? --      Excl. in [place]? --     Most recent vital signs: Vitals:   12/06/22 1337 12/06/22 1400  BP:  (!) 104/40  Pulse:  62  Resp:  18  Temp: 98 F (36.7 C)   SpO2:  94%     General: Awake, no distress.  CV:  Good peripheral perfusion. RRR.  Resp:  Normal work of breathing. Lungs clear. Abd:  No distention.  Other:  No LE edema. 2+ radial and Dp pulses.   ED Results / Procedures / Treatments   Labs (all labs ordered are listed, but only abnormal results are displayed) Labs Reviewed  CBC WITH DIFFERENTIAL/PLATELET - Abnormal; Notable for the following components:      Result Value   RBC 2.80 (*)    Hemoglobin 9.7 (*)    HCT 31.3 (*)    MCV 111.8 (*)    MCH 34.6 (*)    All other components within normal limits   COMPREHENSIVE METABOLIC PANEL - Abnormal; Notable for the following components:   Glucose, Bld 109 (*)    Calcium 8.6 (*)    Total Protein 5.8 (*)    Albumin 3.1 (*)    AST 13 (*)    All other components within normal limits  MAGNESIUM  TSH  TROPONIN I (HIGH SENSITIVITY)     EKG Normal sinus rhythm, trickle rate 73.  PR 162, QRS 101, QTc 433.  No acute ST elevations or depressions.  No acute events of acute ischemia or infarct.   RADIOLOGY Chest x-ray: No active disease   I also independently reviewed and agree with radiologist interpretations.   PROCEDURES:  Critical Care performed: No  .1-3 Lead EKG Interpretation  Performed by: Duffy Bruce, MD Authorized by: Duffy Bruce, MD     Interpretation: normal     ECG rate:  60-80   ECG rate assessment: normal  Rhythm: sinus rhythm     Ectopy: none     Conduction: normal   Comments:     Indication: Chest pain     MEDICATIONS ORDERED IN ED: Medications  sodium chloride 0.9 % bolus 1,000 mL (1,000 mLs Intravenous New Bag/Given 12/06/22 1431)     IMPRESSION / MDM / ASSESSMENT AND PLAN / ED COURSE  I reviewed the triage vital signs and the nursing notes.                              Differential diagnosis includes, but is not limited to, recurrent SVT, AFib, anemia, electrolyte abnormalities, CHF.  Patient's presentation is most consistent with acute presentation with potential threat to life or bodily function.  The patient is on the cardiac monitor to evaluate for evidence of arrhythmia and/or significant heart rate changes   77 yo M here with SVT/AFib episode, now resolved but with mild left sided arm "tingling" during the episode. No chest pain. H/o similar episodes in the past though the arm tingling was worse than previously and went through his whole arm. Additionally, he has been having more SOB w/ exertion lately and was scheduled to have a CT Angio per Pulm.  Pt in NSR here. Labs overall  reassuring - mild anemia which is near baseline (9-10.5). Lytes wnl. EKG nonischemic and shows NSR. Based on discussion with wife and pt, however, will check CT angio to eval for PE given Pulmonary's concern, as well as add on CT Head for his new numbness though this sounds more like referred cardiac sx versus cervical radiculopathy. Unlikely CVA given distribution of sx and he is already optimally anticoagulated.   FINAL CLINICAL IMPRESSION(S) / ED DIAGNOSES   Final diagnoses:  SVT (supraventricular tachycardia)     Rx / DC Orders   ED Discharge Orders     None        Note:  This document was prepared using Dragon voice recognition software and may include unintentional dictation errors.   Duffy Bruce, MD 12/06/22 [phone]

## 2022-12-06 NOTE — ED Notes (Addendum)
Pt took his normal 1700 Hydrocodone pills. 2 x 325mg  Dr Cherylann Banas approved.

## 2022-12-06 NOTE — ED Provider Notes (Signed)
-----------------------------------------   5:32 PM on 12/06/2022 -----------------------------------------  I took over care of this patient from Dr. Ellender Hose.  The patient remains asymptomatic with a normal heart rate.  Initial and repeat troponin are both negative.  CTs are negative for concerning acute findings.  CT head and cervical spine:  IMPRESSION:  CT head:    No evidence of acute intracranial abnormality.    CT cervical spine:    1. No evidence of acute fracture or traumatic malalignment.  2. C3-C6 posterior fusion. Lucency around the C3 screws is  suggestive of loosening. No evidence of bony fusion across the C3-C4  levels.  3. Multilevel degenerative change including calcified disc bulge at  C3-C4 and multilevel foraminal stenosis. An MRI could better  characterize the canal and foramina if clinically warranted.  4. Solid C5-C7 ACDF.   The degenerative change and disc bulge may be contributing the patient's intermittent paresthesias but at this time the patient has no acute neurologic deficits or any evidence of spinal cord impingement.  He can follow-up for an outpatient MRI if needed.  CTA of the chest:  IMPRESSION:  1. No evidence of pulmonary embolism.  2. Areas of probable atelectasis/scarring in the right greater than  left lung bases.  3. 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 Given interval change, recommend follow-up CTA in  approximately 1 year to ensure stability.  4. Aortic Atherosclerosis (ICD10-I70.0).   I counseled the patient on the results of the imaging and labs including the aortic aneurysm and need for follow-up imaging in 1 year.  At this time the patient is feeling well and would like to go home.  He is stable for discharge.  I gave him strict return precautions and he expressed understanding.  He agrees to follow-up with his  cardiologist.   Arta Silence, MD 12/06/22 1734

## 2022-12-08 ENCOUNTER — Ambulatory Visit: Payer: Medicare Other

## 2022-12-13 ENCOUNTER — Ambulatory Visit: Payer: Medicare Other

## 2022-12-15 ENCOUNTER — Ambulatory Visit: Payer: Medicare Other

## 2022-12-20 ENCOUNTER — Ambulatory Visit: Payer: Medicare Other | Attending: Neurosurgery

## 2022-12-20 DIAGNOSIS — M6281 Muscle weakness (generalized): Secondary | ICD-10-CM | POA: Insufficient documentation

## 2022-12-20 DIAGNOSIS — M545 Low back pain, unspecified: Secondary | ICD-10-CM | POA: Diagnosis present

## 2022-12-20 DIAGNOSIS — G8929 Other chronic pain: Secondary | ICD-10-CM | POA: Diagnosis present

## 2022-12-20 DIAGNOSIS — G959 Disease of spinal cord, unspecified: Secondary | ICD-10-CM

## 2022-12-20 DIAGNOSIS — R269 Unspecified abnormalities of gait and mobility: Secondary | ICD-10-CM

## 2022-12-20 DIAGNOSIS — R2689 Other abnormalities of gait and mobility: Secondary | ICD-10-CM | POA: Diagnosis present

## 2022-12-20 DIAGNOSIS — R2681 Unsteadiness on feet: Secondary | ICD-10-CM

## 2022-12-20 DIAGNOSIS — R262 Difficulty in walking, not elsewhere classified: Secondary | ICD-10-CM | POA: Diagnosis present

## 2022-12-20 DIAGNOSIS — R296 Repeated falls: Secondary | ICD-10-CM

## 2022-12-20 DIAGNOSIS — R278 Other lack of coordination: Secondary | ICD-10-CM | POA: Diagnosis present

## 2022-12-20 NOTE — Therapy (Signed)
OUTPATIENT PHYSICAL THERAPY TREATMENT NOTE     Patient Name: Francisco [name] MRN: [id number] DOB:21-Oct-1945, 77 y.o., male Today's Date: 12/20/2022  PCP: Lynnea Ferrier, MD REFERRING PROVIDER: Venetia Night MD   PT End of Session - 12/20/22 1518     Visit Number 56    Number of Visits 74    Date for PT Re-Evaluation 01/31/23    Authorization Type [employer] Medicare    Authorization Time Period 08/11/22-11/03/2022    Progress Note Due on Visit 60    PT Start Time 1518    PT Stop Time 1558    PT Time Calculation (min) 40 min    Equipment Utilized During Treatment Gait belt    Activity Tolerance Patient tolerated treatment well;No increased pain;Patient limited by fatigue    Behavior During Therapy [facility] for tasks assessed/performed                  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   Carotid stenosis    CHF (congestive heart failure)    CKD (chronic kidney disease)    CKD (chronic kidney disease) stage 3, GFR 30-59 ml/min    COPD (chronic obstructive pulmonary disease)    stage 2   DDD (degenerative disc disease), cervical    Dysrhythmia    post carotid stent bradycardia; PAF 09/2020   GERD (gastroesophageal reflux disease)    Hypercholesterolemia    Hypertension    Hypothyroidism    pt takes Levothyroxine daily   Lumbosacral spinal stenosis    Myasthenia gravis, adult form    PAD (peripheral artery disease)    Shortness of breath    Lung MD- Dr Joetta Manners   Sleep apnea    do not use CPAP every night   Past Surgical History:  Procedure Laterality Date   ANTERIOR CERVICAL DECOMP/DISCECTOMY FUSION  07/18/2011   Procedure: ANTERIOR CERVICAL DECOMPRESSION/DISCECTOMY FUSION 2 LEVELS;  Surgeon: Kathaleen Maser Pool;  Location: [facility];  Service: Neurosurgery;  Laterality: N/A;  cervical five-six, cervical six-seven anterior cervical discectomy and fusion   BACK SURGERY     in 1985 [facility]   BILATERAL CARPAL TUNNEL RELEASE     01/2020 Right, 04/2020 Left   CARDIAC CATHETERIZATION     2005 at [facility] [address], no stents   CAROTID PTA/STENT INTERVENTION N/A 09/17/2020   Procedure: CAROTID PTA/STENT INTERVENTION;  Surgeon: Annice Needy, MD;  Location: [facility] INVASIVE CV LAB;  Service: Cardiovascular;  Laterality: N/A;   CATARACT EXTRACTION W/PHACO Left 01/06/2020   Procedure: CATARACT EXTRACTION PHACO AND INTRAOCULAR LENS PLACEMENT (IOC) ISTENT INJ LEFT 3.81  00:33.3;  Surgeon: Nevada Crane, MD;  Location: [facility];  Service: Ophthalmology;  Laterality: Left;   CATARACT EXTRACTION W/PHACO Right 02/03/2020   Procedure: CATARACT EXTRACTION PHACO AND INTRAOCULAR LENS PLACEMENT (IOC) RIGHT ISTENT INJ;  Surgeon: Nevada Crane, MD;  Location: [facility];  Service: Ophthalmology;  Laterality: Right;  4.29 0:35.6   COLONOSCOPY     HERNIA REPAIR Left    inguinal hernia repair in 1985   LUMBAR LAMINECTOMY/DECOMPRESSION MICRODISCECTOMY Left 02/24/2014   Procedure: LUMBAR LAMINECTOMY/DECOMPRESSION MICRODISCECTOMY LUMBAR THREE-FOUR, FOUR-FIVE, LEFT FIVE-SACRAL ONE ;  Surgeon: Temple Pacini, MD;  Location: [facility];  Service: Neurosurgery;  Laterality: Left;  LUMBAR LAMINECTOMY/DECOMPRESSION MICRODISCECTOMY LUMBAR THREE-FOUR, FOUR-FIVE, LEFT FIVE-SACRAL ONE    LUMBAR LAMINECTOMY/DECOMPRESSION MICRODISCECTOMY N/A 05/03/2021   Procedure: Laminectomy and Foraminotomy - L2-L3;  Surgeon: Julio Sicks, MD;  Location: [facility] OR;  Service: Neurosurgery;  Laterality: N/A;  3C   POSTERIOR CERVICAL FUSION/FORAMINOTOMY N/A 08/07/2020   Procedure: C3-6 POSTERIOR FUSION WITH DECOMPRESSION;  Surgeon: Venetia Night, MD;  Location: [facility] ORS;  Service: Neurosurgery;  Laterality: N/A;   PROSTATECTOMY  8/14    [facility] Dr Joycelyn Das    Patient Active Problem List   Diagnosis Date Noted   SVT (supraventricular tachycardia) 12/30/2021   Hypothyroidism 12/30/2021   Parkinson's disease 12/30/2021   Elevated troponin 12/30/2021   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   Acquired thrombophilia 01/13/2021   History of decompression of median nerve 01/01/2021   Paroxysmal atrial fibrillation 10/06/2020   Carotid stenosis, right 09/17/2020   S/P cervical spinal fusion    Leukocytosis    Essential hypertension    Anemia of chronic disease    Postoperative pain    Neuropathic pain    Cervical myelopathy 08/07/2020   Preop cardiovascular exam 07/17/2020   SOB (shortness of breath) on exertion 07/17/2020   Leg weakness, bilateral 07/13/2020   Aortic atherosclerosis 07/09/2020   Body mass index (BMI) 34.0-34.9, adult 12/25/2019   Myalgia 10/30/2019   Lumbar post-laminectomy syndrome 10/24/2019   PAD (peripheral artery disease) 06/06/2019   CKD (chronic kidney disease) stage 3, GFR 30-59 ml/min 04/29/2019   B12 deficiency 01/23/2019   Left arm numbness 02/28/2018   Neck pain 02/28/2018   Anemia 02/02/2017   DDD (degenerative disc disease), cervical 02/02/2017   Hypothyroid 02/02/2017   MRSA (methicillin resistant staph aureus) culture positive 02/02/2017   Nocturnal hypoxia 02/02/2017   Senile purpura 10/03/2016   Essential hypertension, benign 09/16/2016   Bilateral carotid artery disease 09/16/2016   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/18/2016   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   History of prostate cancer 12/21/2015   Left carpal tunnel syndrome 11/11/2015   Kidney stone on left side 07/05/2015   Myasthenia gravis (HCC) 05/26/2015   Radiculitis 01/05/2015   Long-term use of high-risk medication 10/15/2014   Persistent cough 09/10/2014   Pure hypercholesterolemia 07/25/2014   Spinal stenosis, lumbar region, with neurogenic claudication 02/24/2014   Lumbosacral stenosis  with neurogenic claudication 02/24/2014   COPD (chronic obstructive pulmonary disease) 01/22/2014    REFERRING DIAG: balance disorder   THERAPY DIAG:  Difficulty in walking, not elsewhere classified  Muscle weakness (generalized)  Unsteadiness on feet  Other abnormalities of gait and mobility  Chronic bilateral low back pain, unspecified whether sciatica present  Other lack of coordination  Repeated falls  Cervical myelopathy  Abnormality of gait and mobility  Rationale for Evaluation and Treatment Rehabilitation  PERTINENT HISTORY: Johnathen Violett is a 77 year old male referred to OPPT neuro for difficulty with balance. He was also just recently dx in May 2023 with Impingement syndrome of left shoulder (M75.42) Impingement syndrome of left shoulder (primary encounter diagnosis) Left shoulder pain, unspecified chronicity Nontraumatic rupture of left proximal biceps tendon Past medical hx includes Lumbar laminectomy 04/2021. C3-6 decompression, fusion on 11/26, myasthenia gravis, 2012 ACDF 5/6, 6/7; OSA, hypothyroidism, HTN, COPD, multiple back surgeries, 2021 carpal tunnel release.  PRECAUTIONS: Fall  SUBJECTIVE:  Patient having a rough time- back in ED last week with racing hear rate. States going to have  a CT scan upcoming- unsure of date. States appetite is down.  PAIN:  Are you having pain? None reported  TODAY'S TREATMENT: 12/20/22   Therex:   Seated calf raises - 4# AW 2 sets of 15 reps Seated hip march- 4# AW alt LE x 10 reps Seated LE knee ext  using 4# AW=2 sets of 10 reps Seated hip abd (straight leg) 4# AW each LE- 2 sets of 10 reps Seated ham curl with RTB  LE- 2 sets of 15 reps     Pt educated throughout session about proper posture and technique with exercises. Improved exercise technique, movement at target joints, use of target muscles after min to mod verbal, visual, tactile cues.   PATIENT EDUCATION: Education details: Goals, plan, exercise  technique, body mechanics, energy conservation techniques  Person educated: Patient Education method: Explanation, Demonstration, Tactile cues, Verbal cues, and Handouts Education comprehension: verbalized understanding, returned demonstration, verbal cues required, and tactile cues required   HOME EXERCISE PROGRAM:  No Updates today  Access Code: [email or web address] URL: [email or web address] Date: 03/01/2022 Prepared by: Precious Bard  Exercises - Seated March  - 1 x daily - 7 x weekly - 2 sets - 10 reps - 5 hold - Seated Long Arc Quad  - 1 x daily - 7 x weekly - 2 sets - 10 reps - 5 hold - Seated Hip Abduction  - 1 x daily - 7 x weekly - 2 sets - 10 reps - 5 hold - Seated Hip Adduction Isometrics with Ball  - 1 x daily - 7 x weekly - 2 sets - 10 reps - 5 hold - Seated Gluteal Sets  - 1 x daily - 7 x weekly - 2 sets - 10 reps - 5 hold - Seated Heel Toe Raises  - 1 x daily - 7 x weekly - 2 sets - 10 reps - 5 hold LE strength and balance         PT Short Term Goals       PT SHORT TERM GOAL #1   Title Pt will be independent with initial HEP in order to improve strength and balance in order to decrease fall risk and improve function at home and work.    Baseline 02/23/2022- Patient reports not doing much as far as exercise in the home currently. 04/20/2022- Patient reports compliant with HEP and no questions at this time.; 9/7 pt indep   Time 6    Period Weeks    Status Goal met   Target Date 04/06/22              PT Long Term Goals       PT LONG TERM GOAL #1   Title Pt will be independent with final HEP in order to improve strength and balance in order to decrease fall risk and improve function at home and work. 04/20/2022- Patient reports compliant with HEP and no questions at this time.    Baseline 02/23/2022= No formal HEP in place. 9/7: pt performing at least once a day, feels comfortable   Time 12    Period Weeks    Status GOAL MET   Target Date 08/11/2022       PT LONG TERM GOAL #2   Title Pt will decrease 5TSTS by at least 8 seconds in order to demonstrate clinically significant improvement in LE strength.    Baseline 02/23/2022= 31.52 sec with B hands on knees; 04/20/2022= 16.08 sec with B hands on knees   Time  12    Period Days    Status Goal Met   Target Date 05/18/22      PT LONG TERM GOAL #3   Title Pt will increase 10MWT by at least 0.23 m/s in order to demonstrate clinically significant improvement in community ambulation.    Baseline 02/23/2022= 0.39 m/s using RW; 0.45 m/s; 9/7: 0.37 m/s with QC, 0.74 m/s with RW   Time 12    Period Weeks    Status GOAL MET   Target Date 05/18/22      PT LONG TERM GOAL #4   Title Pt will improve FOTO to target score of 50 to display perceived improvements in ability to complete ADL's.    Baseline 02/23/2022= 47; 9/7: 49; 11/30=54%   Time 12    Period Weeks    Status GOAL MET   Target Date 08/11/2022      PT LONG TERM GOAL #5   Title Pt will decrease TUG to below 20 seconds/decrease in order to demonstrate decreased fall risk.    Baseline 02/23/2022=28.27 sec with RW; 04/20/2022= 20.22 with SBQC; 9/7: 14.4 sec with RW, 18.5 sec with SBQC   Time 12    Period Weeks    Status MET   Target Date 05/18/22    PT LONG TERM GOAL #6  Title Pt will decrease TUG to 14 sec or below with SBQC in order to demonstrate decreased fall risk.   Baseline  9/7: 14.4 sec with RW, 18.5 sec with SBQC; 11/30=16.85 sec without an AD. 09/15/2022= 22.14 sec with SPC; 11/08/2022= 25.17 sec without and AD and 21.22 sec with SPC  Time 12   Period Weeks   Status ONGOING  Target Date 01/31/2023   PT LONG TERM GOAL #7  Title Patient will increase six minute walk test distance to >700 ft for improved gait ability and increased ease with community participation.  Baseline 9/7: completes 4 minutes and 444 ft with RW. Test discontinued at 4 minutes due to fatgiue. 08/11/2022= patient ambulated 200 feet yet required 1 seated rest break- due  to BLE fatigue- using SBQC: 09/15/2022= Patient ambulated 300 feet in 4 min 20 sec with 1 seated rest break using SBQC. 11/08/2022 = 175 feet- stopped due to exhausted today- 2 min 10 sec  Time 12   Period Weeks   Status ONGOING  Target Date 01/31/2023   PT LONG TERM GOAL #8  Title Pt will decrease 5TSTS by at least 2 seconds (without UE support)  in order to demonstrate clinically significant improvement in LE strength.   Baseline 08/11/2022= 16.33 sec with B hands on knees; 09/15/2022= 22 sec with B hands on knees (patient reports increased weakness since around Christmas and just has not bounced back); 11/08/2022= 18.85 without UE Support  Time 12   Period Weeks  Status ONGOING  Target Date 01/31/2023       PT LONG TERM GOAL #  Title Pt will improve BERG by at least 3 points in order to demonstrate clinically significant improvement in balance.   Baseline 11/08/2022= 33/56  Time 12   Period Weeks  Status NEW  Target Date 01/31/2023       Plan -     Clinical Impression Statement Patient presents with good motivation despite not feeling great. He continues to build his strength today in seated position taking short and minimal rest breaks as needed. He was able to complete reps without significant issues other than fatigue today. Patient will continue to benefit from  skilled PT services to address deficits and impairment identified in evaluation in order to maximize independence and safety in basic mobility required for performance of ADL, IADL, and leisure.    Personal Factors and Comorbidities Age;Past/Current Experience;Time since onset of injury/illness/exacerbation;Comorbidity 3+    Comorbidities COPD, History of Cancer, myasthenia gravis, chronic lumbar surgical history    Examination-Activity Limitations Lift;Squat;Bend;Stairs;Stand;Transfers;Probation officer;Bathing;Hygiene/Grooming;Dressing;Toileting;Bed Mobility;Caring for Others;Continence     Examination-Participation Restrictions Yard Work;Church;Cleaning;Driving;Community Activity    Stability/Clinical Decision Making Evolving/Moderate complexity    Rehab Potential Good    Clinical Impairments Affecting Rehab Potential multipe comorbidities    PT Frequency 1-2x / week    PT Duration 12 weeks    PT Treatment/Interventions ADLs/Self Care Home Management;Electrical Stimulation;Moist Heat;Traction;Ultrasound;Gait training;Stair training;Functional mobility training;Therapeutic activities;Therapeutic exercise;Balance training;Neuromuscular re-education;Patient/family education;Manual techniques;Passive range of motion;Dry needling;Joint Manipulations;Cryotherapy;Vestibular;Canalith Repostioning    PT Next Visit Plan standing strengthening and balance, seated strengthening and coordination    PT Home Exercise Plan Progress LE strengthening, balance training,  and functional endurance   Consulted and Agree with Plan of Care Patient              Louis Meckel, PT Physical Therapist- [facility]  12/20/22, 5:01 PM

## 2022-12-22 ENCOUNTER — Ambulatory Visit: Payer: Medicare Other

## 2022-12-22 DIAGNOSIS — M6281 Muscle weakness (generalized): Secondary | ICD-10-CM

## 2022-12-22 DIAGNOSIS — R296 Repeated falls: Secondary | ICD-10-CM

## 2022-12-22 DIAGNOSIS — R262 Difficulty in walking, not elsewhere classified: Secondary | ICD-10-CM | POA: Diagnosis not present

## 2022-12-22 DIAGNOSIS — R2689 Other abnormalities of gait and mobility: Secondary | ICD-10-CM

## 2022-12-22 DIAGNOSIS — R278 Other lack of coordination: Secondary | ICD-10-CM

## 2022-12-22 DIAGNOSIS — M545 Low back pain, unspecified: Secondary | ICD-10-CM

## 2022-12-22 DIAGNOSIS — R2681 Unsteadiness on feet: Secondary | ICD-10-CM

## 2022-12-22 NOTE — Therapy (Signed)
OUTPATIENT PHYSICAL THERAPY TREATMENT NOTE     Patient Name: Francisco [name] MRN: [id number] DOB:12/16/1945, 77 y.o., male Today's Date: 12/22/2022  PCP: Lynnea Ferrier, MD REFERRING PROVIDER: Venetia Night MD   PT End of Session - 12/22/22 1533     Visit Number 57    Number of Visits 74    Date for PT Re-Evaluation 01/31/23    Authorization Type [employer] Medicare    Authorization Time Period 08/11/22-11/03/2022    Progress Note Due on Visit 60    PT Start Time 1516    PT Stop Time 1557    PT Time Calculation (min) 41 min    Equipment Utilized During Treatment Gait belt    Activity Tolerance Patient tolerated treatment well;No increased pain;Patient limited by fatigue    Behavior During Therapy [facility] for tasks assessed/performed                   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   Carotid stenosis    CHF (congestive heart failure)    CKD (chronic kidney disease)    CKD (chronic kidney disease) stage 3, GFR 30-59 ml/min    COPD (chronic obstructive pulmonary disease)    stage 2   DDD (degenerative disc disease), cervical    Dysrhythmia    post carotid stent bradycardia; PAF 09/2020   GERD (gastroesophageal reflux disease)    Hypercholesterolemia    Hypertension    Hypothyroidism    pt takes Levothyroxine daily   Lumbosacral spinal stenosis    Myasthenia gravis, adult form    PAD (peripheral artery disease)    Shortness of breath    Lung MD- Dr Joetta Manners   Sleep apnea    do not use CPAP every night   Past Surgical History:  Procedure Laterality Date   ANTERIOR CERVICAL DECOMP/DISCECTOMY FUSION  07/18/2011   Procedure: ANTERIOR CERVICAL DECOMPRESSION/DISCECTOMY FUSION 2 LEVELS;  Surgeon:  Kathaleen Maser Pool;  Location: [facility];  Service: Neurosurgery;  Laterality: N/A;  cervical five-six, cervical six-seven anterior cervical discectomy and fusion   BACK SURGERY     in 1985 [facility]   BILATERAL CARPAL TUNNEL RELEASE     01/2020 Right, 04/2020 Left   CARDIAC CATHETERIZATION     2005 at [facility], no stents   CAROTID PTA/STENT INTERVENTION N/A 09/17/2020   Procedure: CAROTID PTA/STENT INTERVENTION;  Surgeon: Annice Needy, MD;  Location: [facility] IN[facility] CV [facility];  Service: Cardiovascular;  Laterality: N/A;   CATARACT EXTRACTION W/PHACO Left 01/06/2020   Procedure: CATARACT EXTRACTION PHACO AND INTRAOCULAR LENS PLACEMENT (IOC) ISTENT INJ LEFT 3.81  00:33.3;  Surgeon: Nevada Crane, MD;  Location: [facility];  Service: Ophthalmology;  Laterality: Left;   CATARACT EXTRACTION W/PHACO Right 02/03/2020   Procedure: CATARACT EXTRACTION PHACO AND INTRAOCULAR LENS PLACEMENT (IOC) RIGHT ISTENT INJ;  Surgeon: Nevada Crane, MD;  Location: [facility];  Service: Ophthalmology;  Laterality: Right;  4.29 0:35.6   COLONOSCOPY     HERNIA REPAIR Left    inguinal hernia repair in 1985   LUMBAR LAMINECTOMY/DECOMPRESSION MICRODISCECTOMY Left 02/24/2014   Procedure: LUMBAR LAMINECTOMY/DECOMPRESSION MICRODISCECTOMY LUMBAR THREE-FOUR, FOUR-FIVE, LEFT FIVE-SACRAL  ONE ;  Surgeon: Temple Pacini, MD;  Location: [facility];  Service: Neurosurgery;  Laterality: Left;  LUMBAR LAMINECTOMY/DECOMPRESSION MICRODISCECTOMY LUMBAR THREE-FOUR, FOUR-FIVE, LEFT FIVE-SACRAL ONE    LUMBAR LAMINECTOMY/DECOMPRESSION MICRODISCECTOMY N/A 05/03/2021   Procedure: Laminectomy and Foraminotomy - L2-L3;  Surgeon: Julio Sicks, MD;  Location: [facility];  Service: Neurosurgery;  Laterality: N/A;  3C   POSTERIOR CERVICAL FUSION/FORAMINOTOMY N/A 08/07/2020   Procedure: C3-6 POSTERIOR FUSION WITH DECOMPRESSION;  Surgeon: Venetia Night, MD;  Location: [facility] OR[facility];  Service: Neurosurgery;  Laterality: N/A;   PROSTATECTOMY  8/14    [facility] Dr Joycelyn Das    Patient Active Problem List   Diagnosis Date Noted   SVT (supraventricular tachycardia) 12/30/2021   Hypothyroidism 12/30/2021   Parkinson's disease 12/30/2021   Elevated troponin 12/30/2021   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   Acquired thrombophilia 01/13/2021   History of decompression of median nerve 01/01/2021   Paroxysmal atrial fibrillation 10/06/2020   Carotid stenosis, right 09/17/2020   S/P cervical spinal fusion    Leukocytosis    Essential hypertension    Anemia of chronic disease    Postoperative pain    Neuropathic pain    Cervical myelopathy 08/07/2020   Preop cardiovascular exam 07/17/2020   SOB (shortness of breath) on exertion 07/17/2020   Leg weakness, bilateral 07/13/2020   Aortic atherosclerosis 07/09/2020   Body mass index (BMI) 34.0-34.9, adult 12/25/2019   Myalgia 10/30/2019   Lumbar post-laminectomy syndrome 10/24/2019   PAD (peripheral artery disease) 06/06/2019   CKD (chronic kidney disease) stage 3, GFR 30-59 ml/min 04/29/2019   B12 deficiency 01/23/2019   Left arm numbness 02/28/2018   Neck pain 02/28/2018   Anemia 02/02/2017   DDD (degenerative disc disease), cervical 02/02/2017   Hypothyroid 02/02/2017   MRSA (methicillin resistant staph aureus) culture positive 02/02/2017   Nocturnal hypoxia 02/02/2017   Senile purpura 10/03/2016   Essential hypertension, benign 09/16/2016   Bilateral carotid artery disease 09/16/2016   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/18/2016   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   History of prostate cancer 12/21/2015   Left carpal tunnel syndrome 11/11/2015   Kidney stone on left side 07/05/2015   Myasthenia gravis (HCC) 05/26/2015   Radiculitis 01/05/2015   Long-term use of high-risk medication 10/15/2014   Persistent cough 09/10/2014   Pure hypercholesterolemia 07/25/2014   Spinal stenosis, lumbar region, with neurogenic claudication 02/24/2014   Lumbosacral  stenosis with neurogenic claudication 02/24/2014   COPD (chronic obstructive pulmonary disease) 01/22/2014    REFERRING DIAG: balance disorder   THERAPY DIAG:  Difficulty in walking, not elsewhere classified  Muscle weakness (generalized)  Unsteadiness on feet  Other abnormalities of gait and mobility  Chronic bilateral low back pain, unspecified whether sciatica present  Other lack of coordination  Repeated falls  Rationale for Evaluation and Treatment Rehabilitation  PERTINENT HISTORY: Derrick Mockler is a 77 year old male referred to [facility] neuro for difficulty with balance. He was also just recently dx in May 2023 with Impingement syndrome of left shoulder (M75.42) Impingement syndrome of left shoulder (primary encounter diagnosis) Left shoulder pain, unspecified chronicity Nontraumatic rupture of left proximal biceps tendon Past medical hx includes Lumbar laminectomy 04/2021. C3-6 decompression, fusion on 11/26, myasthenia gravis, 2012 ACDF 5/6, 6/7; OSA, hypothyroidism, HTN, COPD, multiple back surgeries, 2021 carpal tunnel release.  PRECAUTIONS: Fall  SUBJECTIVE:  Patient reports continuing to feel weak overall but gonna keep working to get better.    PAIN:  Are you having pain? None  reported  TODAY'S TREATMENT: 12/22/22   Therex:   Circuit style- 3 rounds of the following LE strengthening and walking:  -Hip march 10 reps alt LE -Knee ext  10 reps alt LE -Seated calf raises 10 reps alt LE Gait using 4WW x 175 feet   4th rd:   Sit to stand x 10 reps  Standing lumbar flex into ext x 10 reps Gait using 4WW x 175 feet           Pt educated throughout session about proper posture and technique with exercises. Improved exercise technique, movement at target joints, use of target muscles after min to mod verbal, visual, tactile cues.   PATIENT EDUCATION: Education details: Goals, plan, exercise technique, body mechanics, energy conservation techniques   Person educated: Patient Education method: Explanation, Demonstration, Tactile cues, Verbal cues, and Handouts Education comprehension: verbalized understanding, returned demonstration, verbal cues required, and tactile cues required   HOME EXERCISE PROGRAM:  No Updates today  Access Code: [email or web address] URL: [email or web address] Date: 03/01/2022 Prepared by: Precious Bard  Exercises - Seated March  - 1 x daily - 7 x weekly - 2 sets - 10 reps - 5 hold - Seated Long Arc Quad  - 1 x daily - 7 x weekly - 2 sets - 10 reps - 5 hold - Seated Hip Abduction  - 1 x daily - 7 x weekly - 2 sets - 10 reps - 5 hold - Seated Hip Adduction Isometrics with Ball  - 1 x daily - 7 x weekly - 2 sets - 10 reps - 5 hold - Seated Gluteal Sets  - 1 x daily - 7 x weekly - 2 sets - 10 reps - 5 hold - Seated Heel Toe Raises  - 1 x daily - 7 x weekly - 2 sets - 10 reps - 5 hold LE strength and balance         PT Short Term Goals       PT SHORT TERM GOAL #1   Title Pt will be independent with initial HEP in order to improve strength and balance in order to decrease fall risk and improve function at home and work.    Baseline 02/23/2022- Patient reports not doing much as far as exercise in the home currently. 04/20/2022- Patient reports compliant with HEP and no questions at this time.; 9/7 pt indep   Time 6    Period Weeks    Status Goal met   Target Date 04/06/22              PT Long Term Goals       PT LONG TERM GOAL #1   Title Pt will be independent with final HEP in order to improve strength and balance in order to decrease fall risk and improve function at home and work. 04/20/2022- Patient reports compliant with HEP and no questions at this time.    Baseline 02/23/2022= No formal HEP in place. 9/7: pt performing at least once a day, feels comfortable   Time 12    Period Weeks    Status GOAL MET   Target Date 08/11/2022      PT LONG TERM GOAL #2   Title Pt will decrease 5TSTS by  at least 8 seconds in order to demonstrate clinically significant improvement in LE strength.    Baseline 02/23/2022= 31.52 sec with B hands on knees; 04/20/2022= 16.08 sec with B hands on knees   Time 12    Period Days  Status Goal Met   Target Date 05/18/22      PT LONG TERM GOAL #3   Title Pt will increase 10MWT by at least 0.23 m/s in order to demonstrate clinically significant improvement in community ambulation.    Baseline 02/23/2022= 0.39 m/s using RW; 0.45 m/s; 9/7: 0.37 m/s with QC, 0.74 m/s with RW   Time 12    Period Weeks    Status GOAL MET   Target Date 05/18/22      PT LONG TERM GOAL #4   Title Pt will improve FOTO to target score of 50 to display perceived improvements in ability to complete ADL's.    Baseline 02/23/2022= 47; 9/7: 49; 11/30=54%   Time 12    Period Weeks    Status GOAL MET   Target Date 08/11/2022      PT LONG TERM GOAL #5   Title Pt will decrease TUG to below 20 seconds/decrease in order to demonstrate decreased fall risk.    Baseline 02/23/2022=28.27 sec with RW; 04/20/2022= 20.22 with SBQC; 9/7: 14.4 sec with RW, 18.5 sec with SBQC   Time 12    Period Weeks    Status MET   Target Date 05/18/22    PT LONG TERM GOAL #6  Title Pt will decrease TUG to 14 sec or below with SBQC in order to demonstrate decreased fall risk.   Baseline  9/7: 14.4 sec with RW, 18.5 sec with SBQC; 11/30=16.85 sec without an AD. 09/15/2022= 22.14 sec with SPC; 11/08/2022= 25.17 sec without and AD and 21.22 sec with SPC  Time 12   Period Weeks   Status ONGOING  Target Date 01/31/2023   PT LONG TERM GOAL #7  Title Patient will increase six minute walk test distance to >700 ft for improved gait ability and increased ease with community participation.  Baseline 9/7: completes 4 minutes and 444 ft with RW. Test discontinued at 4 minutes due to fatgiue. 08/11/2022= patient ambulated 200 feet yet required 1 seated rest break- due to BLE fatigue- using SBQC: 09/15/2022= Patient ambulated  300 feet in 4 min 20 sec with 1 seated rest break using SBQC. 11/08/2022 = 175 feet- stopped due to exhausted today- 2 min 10 sec  Time 12   Period Weeks   Status ONGOING  Target Date 01/31/2023   PT LONG TERM GOAL #8  Title Pt will decrease 5TSTS by at least 2 seconds (without UE support)  in order to demonstrate clinically significant improvement in LE strength.   Baseline 08/11/2022= 16.33 sec with B hands on knees; 09/15/2022= 22 sec with B hands on knees (patient reports increased weakness since around Christmas and just has not bounced back); 11/08/2022= 18.85 without UE Support  Time 12   Period Weeks  Status ONGOING  Target Date 01/31/2023       PT LONG TERM GOAL #  Title Pt will improve BERG by at least 3 points in order to demonstrate clinically significant improvement in balance.   Baseline 11/08/2022= 33/56  Time 12   Period Weeks  Status NEW  Target Date 01/31/2023       Plan -     Clinical Impression Statement Treatment continued to focus on LE strengthening and patient performed circuit style workout including some seated and some standing/walking. He exhibited some fatigue but ambulated much better with use of device with improvement in posture, step length, and overall endurance. Patient will continue to benefit from skilled PT services to address deficits and impairment identified  in evaluation in order to maximize independence and safety in basic mobility required for performance of ADL, IADL, and leisure.    Personal Factors and Comorbidities Age;Past/Current Experience;Time since onset of injury/illness/exacerbation;Comorbidity 3+    Comorbidities COPD, History of Cancer, myasthenia gravis, chronic lumbar surgical history    Examination-Activity Limitations Lift;Squat;Bend;Stairs;Stand;Transfers;Probation officer;Bathing;Hygiene/Grooming;Dressing;Toileting;Bed Mobility;Caring for Others;Continence    Examination-Participation Restrictions Yard  Work;Church;Cleaning;Driving;Community Activity    Stability/Clinical Decision Making Evolving/Moderate complexity    Rehab Potential Good    Clinical Impairments Affecting Rehab Potential multipe comorbidities    PT Frequency 1-2x / week    PT Duration 12 weeks    PT Treatment/Interventions ADLs/Self Care Home Management;Electrical Stimulation;Moist Heat;Traction;Ultrasound;Gait training;Stair training;Functional mobility training;Therapeutic activities;Therapeutic exercise;Balance training;Neuromuscular re-education;Patient/family education;Manual techniques;Passive range of motion;Dry needling;Joint Manipulations;Cryotherapy;Vestibular;Canalith Repostioning    PT Next Visit Plan standing strengthening and balance, seated strengthening and coordination    PT Home Exercise Plan Progress LE strengthening, balance training,  and functional endurance   Consulted and Agree with Plan of Care Patient              Louis Meckel, PT Physical Therapist- [facility]  12/22/22, 4:21 PM

## 2022-12-26 ENCOUNTER — Other Ambulatory Visit (INDEPENDENT_AMBULATORY_CARE_PROVIDER_SITE_OTHER): Payer: Self-pay | Admitting: Nurse Practitioner

## 2022-12-27 ENCOUNTER — Ambulatory Visit: Payer: Medicare Other

## 2022-12-29 ENCOUNTER — Ambulatory Visit: Payer: Medicare Other

## 2022-12-30 ENCOUNTER — Telehealth: Payer: Self-pay

## 2022-12-30 NOTE — Telephone Encounter (Signed)
Pt called clinic requesting for advice on medications and bp. Pt stated waiting to hear from  Dr. Darrold Junker office for advice. Pt was last seen by Clarisa Kindred ,FNP on March 2023.  Pt requested to make an appointment with Foster Simpson.  Appt made for 01/04/23 .

## 2023-01-03 ENCOUNTER — Ambulatory Visit: Payer: Medicare Other

## 2023-01-03 DIAGNOSIS — M6281 Muscle weakness (generalized): Secondary | ICD-10-CM

## 2023-01-03 DIAGNOSIS — R278 Other lack of coordination: Secondary | ICD-10-CM

## 2023-01-03 DIAGNOSIS — R262 Difficulty in walking, not elsewhere classified: Secondary | ICD-10-CM | POA: Diagnosis not present

## 2023-01-03 DIAGNOSIS — R296 Repeated falls: Secondary | ICD-10-CM

## 2023-01-03 DIAGNOSIS — R2681 Unsteadiness on feet: Secondary | ICD-10-CM

## 2023-01-03 DIAGNOSIS — M545 Low back pain, unspecified: Secondary | ICD-10-CM

## 2023-01-03 DIAGNOSIS — R2689 Other abnormalities of gait and mobility: Secondary | ICD-10-CM

## 2023-01-03 NOTE — Therapy (Signed)
OUTPATIENT PHYSICAL THERAPY TREATMENT NOTE     Patient Name: Francisco [name] MRN: [id number] DOB:11-30-1945, 77 y.o., male Today's Date: 01/03/2023  PCP: Lynnea Ferrier, MD REFERRING PROVIDER: Venetia Night MD   PT End of Session - 01/03/23 1531     Visit Number 58    Number of Visits 74    Date for PT Re-Evaluation 01/31/23    Authorization Type [employer] Medicare    Authorization Time Period 08/11/22-11/03/2022    Progress Note Due on Visit 60    PT Start Time 1517    PT Stop Time 1600    PT Time Calculation (min) 43 min    Equipment Utilized During Treatment Gait belt    Activity Tolerance Patient tolerated treatment well;No increased pain;Patient limited by fatigue    Behavior During Therapy [facility] for tasks assessed/performed                    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   Carotid stenosis    CHF (congestive heart failure)    CKD (chronic kidney disease)    CKD (chronic kidney disease) stage 3, GFR 30-59 ml/min    COPD (chronic obstructive pulmonary disease)    stage 2   DDD (degenerative disc disease), cervical    Dysrhythmia    post carotid stent bradycardia; PAF 09/2020   GERD (gastroesophageal reflux disease)    Hypercholesterolemia    Hypertension    Hypothyroidism    pt takes Levothyroxine daily   Lumbosacral spinal stenosis    Myasthenia gravis, adult form    PAD (peripheral artery disease)    Shortness of breath    Lung MD- Dr Joetta Manners   Sleep apnea    do not use CPAP every night   Past Surgical History:  Procedure Laterality Date   ANTERIOR CERVICAL DECOMP/DISCECTOMY FUSION  07/18/2011   Procedure: ANTERIOR CERVICAL DECOMPRESSION/DISCECTOMY FUSION 2 LEVELS;  Surgeon:  Kathaleen Maser Pool;  Location: [facility];  Service: Neurosurgery;  Laterality: N/A;  cervical five-six, cervical six-seven anterior cervical discectomy and fusion   BACK SURGERY     in 1985 [facility]   BILATERAL CARPAL TUNNEL RELEASE     01/2020 Right, 04/2020 Left   CARDIAC CATHETERIZATION     2005 at [facility], no stents   CAROTID PTA/STENT INTERVENTION N/A 09/17/2020   Procedure: CAROTID PTA/STENT INTERVENTION;  Surgeon: Annice Needy, MD;  Location: [facility] INV[facility]IVE CV [facility];  Service: Cardiovascular;  Laterality: N/A;   CATARACT EXTRACTION W/PHACO Left 01/06/2020   Procedure: CATARACT EXTRACTION PHACO AND INTRAOCULAR LENS PLACEMENT (IOC) ISTENT INJ LEFT 3.81  00:33.3;  Surgeon: Nevada Crane, MD;  Location: [facility];  Service: Ophthalmology;  Laterality: Left;   CATARACT EXTRACTION W/PHACO Right 02/03/2020   Procedure: CATARACT EXTRACTION PHACO AND INTRAOCULAR LENS PLACEMENT (IOC) RIGHT ISTENT INJ;  Surgeon: Nevada Crane, MD;  Location: [facility];  Service: Ophthalmology;  Laterality: Right;  4.29 0:35.6   COLONOSCOPY     HERNIA REPAIR Left    inguinal hernia repair in 1985   LUMBAR LAMINECTOMY/DECOMPRESSION MICRODISCECTOMY Left 02/24/2014   Procedure: LUMBAR LAMINECTOMY/DECOMPRESSION MICRODISCECTOMY LUMBAR THREE-FOUR, FOUR-FIVE, LEFT  FIVE-SACRAL ONE ;  Surgeon: Temple Pacini, MD;  Location: [facility];  Service: Neurosurgery;  Laterality: Left;  LUMBAR LAMINECTOMY/DECOMPRESSION MICRODISCECTOMY LUMBAR THREE-FOUR, FOUR-FIVE, LEFT FIVE-SACRAL ONE    LUMBAR LAMINECTOMY/DECOMPRESSION MICRODISCECTOMY N/A 05/03/2021   Procedure: Laminectomy and Foraminotomy - L2-L3;  Surgeon: Julio Sicks, MD;  Location: [facility];  Service: Neurosurgery;  Laterality: N/A;  3C   POSTERIOR CERVICAL FUSION/FORAMINOTOMY N/A 08/07/2020   Procedure: C3-6 POSTERIOR FUSION WITH DECOMPRESSION;  Surgeon: Venetia Night, MD;  Location: [facility] OR[facility];  Service: Neurosurgery;  Laterality: N/A;   PROSTATECTOMY  8/14    [facility] Dr Joycelyn Das    Patient Active Problem List   Diagnosis Date Noted   SVT (supraventricular tachycardia) 12/30/2021   Hypothyroidism 12/30/2021   Parkinson's disease 12/30/2021   Elevated troponin 12/30/2021   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   Acquired thrombophilia 01/13/2021   History of decompression of median nerve 01/01/2021   Paroxysmal atrial fibrillation 10/06/2020   Carotid stenosis, right 09/17/2020   S/P cervical spinal fusion    Leukocytosis    Essential hypertension    Anemia of chronic disease    Postoperative pain    Neuropathic pain    Cervical myelopathy 08/07/2020   Preop cardiovascular exam 07/17/2020   SOB (shortness of breath) on exertion 07/17/2020   Leg weakness, bilateral 07/13/2020   Aortic atherosclerosis 07/09/2020   Body mass index (BMI) 34.0-34.9, adult 12/25/2019   Myalgia 10/30/2019   Lumbar post-laminectomy syndrome 10/24/2019   PAD (peripheral artery disease) 06/06/2019   CKD (chronic kidney disease) stage 3, GFR 30-59 ml/min 04/29/2019   B12 deficiency 01/23/2019   Left arm numbness 02/28/2018   Neck pain 02/28/2018   Anemia 02/02/2017   DDD (degenerative disc disease), cervical 02/02/2017   Hypothyroid 02/02/2017   MRSA (methicillin resistant staph aureus) culture positive 02/02/2017   Nocturnal hypoxia 02/02/2017   Senile purpura 10/03/2016   Essential hypertension, benign 09/16/2016   Bilateral carotid artery disease 09/16/2016   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/18/2016   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   History of prostate cancer 12/21/2015   Left carpal tunnel syndrome 11/11/2015   Kidney stone on left side 07/05/2015   Myasthenia gravis (HCC) 05/26/2015   Radiculitis 01/05/2015   Long-term use of high-risk medication 10/15/2014   Persistent cough 09/10/2014   Pure hypercholesterolemia 07/25/2014   Spinal stenosis, lumbar region, with neurogenic claudication 02/24/2014   Lumbosacral  stenosis with neurogenic claudication 02/24/2014   COPD (chronic obstructive pulmonary disease) 01/22/2014    REFERRING DIAG: balance disorder   THERAPY DIAG:  Difficulty in walking, not elsewhere classified  Muscle weakness (generalized)  Unsteadiness on feet  Other abnormalities of gait and mobility  Chronic bilateral low back pain, unspecified whether sciatica present  Other lack of coordination  Repeated falls  Rationale for Evaluation and Treatment Rehabilitation  PERTINENT HISTORY: Griffith Santilli is a 77 year old male referred to OPPT neuro for difficulty with balance. He was also just recently dx in May 2023 with Impingement syndrome of left shoulder (M75.42) Impingement syndrome of left shoulder (primary encounter diagnosis) Left shoulder pain, unspecified chronicity Nontraumatic rupture of left proximal biceps tendon Past medical hx includes Lumbar laminectomy 04/2021. C3-6 decompression, fusion on 11/26, myasthenia gravis, 2012 ACDF 5/6, 6/7; OSA, hypothyroidism, HTN, COPD, multiple back surgeries, 2021 carpal tunnel release.  PRECAUTIONS: Fall  SUBJECTIVE: Patient continuing to feel rough. States going back to MD- Patient reports continuing to feel weak overall but gonna keep working to get better.  PAIN:  Are you having pain? None reported  TODAY'S TREATMENT: 01/03/23   Therex:   Supine- knee to chest (3# AW on right and no weight on left LE) x 12 reps each LE Supine- hip abd (3# Aw on right and no weight on left LE) x 12 reps each LE Supine - Hip flex/abd up and over hedgehog ball x 10 reps each LE x 12 reps Sidelye hip clamshell x 12 reps each LE Sidelye hip abd x 12 reps each LE Sit to stand with orange ball x 12 reps  Seated knee ext (3# AW on right and no weight on left LE) x 12 reps  Gait using SPC x 125 feet (part of walk- patient just held cane to attempt independent ambulation)- mostly step to gait but able to improve to more reciprocal step with  use of cane.           Pt educated throughout session about proper posture and technique with exercises. Improved exercise technique, movement at target joints, use of target muscles after min to mod verbal, visual, tactile cues.   PATIENT EDUCATION: Education details: Goals, plan, exercise technique, body mechanics, energy conservation techniques  Person educated: Patient Education method: Explanation, Demonstration, Tactile cues, Verbal cues, and Handouts Education comprehension: verbalized understanding, returned demonstration, verbal cues required, and tactile cues required   HOME EXERCISE PROGRAM:  No Updates today  Access Code: [email or web address] URL: [email or web address] Date: 03/01/2022 Prepared by: Precious Bard  Exercises - Seated March  - 1 x daily - 7 x weekly - 2 sets - 10 reps - 5 hold - Seated Long Arc Quad  - 1 x daily - 7 x weekly - 2 sets - 10 reps - 5 hold - Seated Hip Abduction  - 1 x daily - 7 x weekly - 2 sets - 10 reps - 5 hold - Seated Hip Adduction Isometrics with Ball  - 1 x daily - 7 x weekly - 2 sets - 10 reps - 5 hold - Seated Gluteal Sets  - 1 x daily - 7 x weekly - 2 sets - 10 reps - 5 hold - Seated Heel Toe Raises  - 1 x daily - 7 x weekly - 2 sets - 10 reps - 5 hold LE strength and balance         PT Short Term Goals       PT SHORT TERM GOAL #1   Title Pt will be independent with initial HEP in order to improve strength and balance in order to decrease fall risk and improve function at home and work.    Baseline 02/23/2022- Patient reports not doing much as far as exercise in the home currently. 04/20/2022- Patient reports compliant with HEP and no questions at this time.; 9/7 pt indep   Time 6    Period Weeks    Status Goal met   Target Date 04/06/22              PT Long Term Goals       PT LONG TERM GOAL #1   Title Pt will be independent with final HEP in order to improve strength and balance in order to decrease fall  risk and improve function at home and work. 04/20/2022- Patient reports compliant with HEP and no questions at this time.    Baseline 02/23/2022= No formal HEP in place. 9/7: pt performing at least once a day, feels comfortable   Time 12    Period Weeks  Status GOAL MET   Target Date 08/11/2022      PT LONG TERM GOAL #2   Title Pt will decrease 5TSTS by at least 8 seconds in order to demonstrate clinically significant improvement in LE strength.    Baseline 02/23/2022= 31.52 sec with B hands on knees; 04/20/2022= 16.08 sec with B hands on knees   Time 12    Period Days    Status Goal Met   Target Date 05/18/22      PT LONG TERM GOAL #3   Title Pt will increase by at least 0.23 m/s in order to demonstrate clinically significant improvement in community ambulation.    Baseline 02/23/2022= 0.39 m/s using RW; 0.45 m/s; 9/7: 0.37 m/s with QC, 0.74 m/s with RW   Time 12    Period Weeks    Status GOAL MET   Target Date 05/18/22      PT LONG TERM GOAL #4   Title Pt will improve FOTO to target score of 50 to display perceived improvements in ability to complete ADL's.    Baseline 02/23/2022= 47; 9/7: 49; 11/30=54%   Time 12    Period Weeks    Status GOAL MET   Target Date 08/11/2022      PT LONG TERM GOAL #5   Title Pt will decrease TUG to below 20 seconds/decrease in order to demonstrate decreased fall risk.    Baseline 02/23/2022=28.27 sec with RW; 04/20/2022= 20.22 with SBQC; 9/7: 14.4 sec with RW, 18.5 sec with SBQC   Time 12    Period Weeks    Status MET   Target Date 05/18/22    PT LONG TERM GOAL #6  Title Pt will decrease TUG to 14 sec or below with SBQC in order to demonstrate decreased fall risk.   Baseline  9/7: 14.4 sec with RW, 18.5 sec with SBQC; 11/30=16.85 sec without an AD. 09/15/2022= 22.14 sec with SPC; 11/08/2022= 25.17 sec without and AD and 21.22 sec with SPC  Time 12   Period Weeks   Status ONGOING  Target Date 01/31/2023   PT LONG TERM GOAL #7  Title Patient  will increase six minute walk test distance to >700 ft for improved gait ability and increased ease with community participation.  Baseline 9/7: completes 4 minutes and 444 ft with RW. Test discontinued at 4 minutes due to fatgiue. 08/11/2022= patient ambulated 200 feet yet required 1 seated rest break- due to BLE fatigue- using SBQC: 09/15/2022= Patient ambulated 300 feet in 4 min 20 sec with 1 seated rest break using SBQC. 11/08/2022 = 175 feet- stopped due to exhausted today- 2 min 10 sec  Time 12   Period Weeks   Status ONGOING  Target Date 01/31/2023   PT LONG TERM GOAL #8  Title Pt will decrease 5TSTS by at least 2 seconds (without UE support)  in order to demonstrate clinically significant improvement in LE strength.   Baseline 08/11/2022= 16.33 sec with B hands on knees; 09/15/2022= 22 sec with B hands on knees (patient reports increased weakness since around Christmas and just has not bounced back); 11/08/2022= 18.85 without UE Support  Time 12   Period Weeks  Status ONGOING  Target Date 01/31/2023       PT LONG TERM GOAL #  Title Pt will improve BERG by at least 3 points in order to demonstrate clinically significant improvement in balance.   Baseline 11/08/2022= 33/56  Time 12   Period Weeks  Status NEW  Target  Date 01/31/2023       Plan -     Clinical Impression Statement Patient arrived with good motivation but states several medical issues have got him down- including cardiac and parkinsons. He presents with increased fatigue after performing several reps (left LE much weaker than right). His gait distance was limited also by fatigue at end of session and will remain an area of focus. Patient will continue to benefit from skilled PT services to address deficits and impairment identified in evaluation in order to maximize independence and safety in basic mobility required for performance of ADL, IADL, and leisure.    Personal Factors and Comorbidities Age;Past/Current Experience;Time  since onset of injury/illness/exacerbation;Comorbidity 3+    Comorbidities COPD, History of Cancer, myasthenia gravis, chronic lumbar surgical history    Examination-Activity Limitations Lift;Squat;Bend;Stairs;Stand;Transfers;Probation officer;Bathing;Hygiene/Grooming;Dressing;Toileting;Bed Mobility;Caring for Others;Continence    Examination-Participation Restrictions Yard Work;Church;Cleaning;Driving;Community Activity    Stability/Clinical Decision Making Evolving/Moderate complexity    Rehab Potential Good    Clinical Impairments Affecting Rehab Potential multipe comorbidities    PT Frequency 1-2x / week    PT Duration 12 weeks    PT Treatment/Interventions ADLs/Self Care Home Management;Electrical Stimulation;Moist Heat;Traction;Ultrasound;Gait training;Stair training;Functional mobility training;Therapeutic activities;Therapeutic exercise;Balance training;Neuromuscular re-education;Patient/family education;Manual techniques;Passive range of motion;Dry needling;Joint Manipulations;Cryotherapy;Vestibular;Canalith Repostioning    PT Next Visit Plan standing strengthening and balance, seated strengthening and coordination    PT Home Exercise Plan Progress LE strengthening, balance training,  and functional endurance   Consulted and Agree with Plan of Care Patient              Louis Meckel, PT Physical Therapist- [facility]  01/03/23, 5:10 PM

## 2023-01-04 ENCOUNTER — Ambulatory Visit: Payer: Medicare Other | Attending: Family | Admitting: Family

## 2023-01-04 ENCOUNTER — Encounter: Payer: Self-pay | Admitting: Family

## 2023-01-04 VITALS — BP 124/50 | HR 76 | Wt 237.6 lb

## 2023-01-04 DIAGNOSIS — G20A1 Parkinson's disease without dyskinesia, without mention of fluctuations: Secondary | ICD-10-CM | POA: Insufficient documentation

## 2023-01-04 DIAGNOSIS — I428 Other cardiomyopathies: Secondary | ICD-10-CM | POA: Diagnosis not present

## 2023-01-04 DIAGNOSIS — I480 Paroxysmal atrial fibrillation: Secondary | ICD-10-CM

## 2023-01-04 DIAGNOSIS — J449 Chronic obstructive pulmonary disease, unspecified: Secondary | ICD-10-CM

## 2023-01-04 DIAGNOSIS — I10 Essential (primary) hypertension: Secondary | ICD-10-CM | POA: Diagnosis not present

## 2023-01-04 DIAGNOSIS — Z87891 Personal history of nicotine dependence: Secondary | ICD-10-CM | POA: Diagnosis not present

## 2023-01-04 DIAGNOSIS — G7000 Myasthenia gravis without (acute) exacerbation: Secondary | ICD-10-CM

## 2023-01-04 DIAGNOSIS — I5032 Chronic diastolic (congestive) heart failure: Secondary | ICD-10-CM | POA: Diagnosis not present

## 2023-01-04 DIAGNOSIS — N183 Chronic kidney disease, stage 3 unspecified: Secondary | ICD-10-CM | POA: Insufficient documentation

## 2023-01-04 DIAGNOSIS — Z79899 Other long term (current) drug therapy: Secondary | ICD-10-CM | POA: Insufficient documentation

## 2023-01-04 DIAGNOSIS — I130 Hypertensive heart and chronic kidney disease with heart failure and stage 1 through stage 4 chronic kidney disease, or unspecified chronic kidney disease: Secondary | ICD-10-CM | POA: Insufficient documentation

## 2023-01-04 DIAGNOSIS — Z7901 Long term (current) use of anticoagulants: Secondary | ICD-10-CM | POA: Insufficient documentation

## 2023-01-04 NOTE — Progress Notes (Signed)
Patient ID: Francisco [name], male    DOB: 1945/09/21, 77 y.o.   MRN: [id number]  Primary cardiologist: Marcina Millard, MD (last seen 12/15/22; returns 05/24) PCP: Lynnea Ferrier, MD (last seen 12/23/22)   Francisco [name] is a 77 y/o male with a history of prostate cancer, carotid disease, hyperlipidemia, HTN, CKD, thyroid disease, arthritis, COPD, DDD, GERD, myasthenia gravis, PAD, sleep apnea, parkinson's, previous tobacco use and chronic heart failure.   Echo 12/30/21: EF 50-55% with mild LVH and mild Francisco. Echo 10/19/21: EF of 55%, moderate pulmonary HTN, mild biatrial enlargement.   Was in the ED 12/06/22 due to SVT. Converted to NSR after 1 dose of IV adenosine. CT angio negative for PE.   He presents today for a HF follow up visit although hasn't been seen since 03/23. He presents with a chief complaint of minimal fatigue with moderate exertion. Chronic in nature. Has SOB & weakness along with this. Denies dizziness, difficulty sleeping, abdominal distention, palpitations, pedal edema, chest pain, wheezing, cough or weight gain.   Had an ablation done 06/30/22. Was previously taking metoprolol PRN for elevated HR. Due to HTN, he was instructed to take 12.5mg  daily but he was concerned about that so he's been taking 6.25mg  every other day and his BP has been under control.   Voices frustration with the "lack of concern" that he feels like he's getting from his current cardiology group/ weekend answering service. Asks for alternate cardiology groups in [place] as he says that he is considering switching groups.  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   Carotid stenosis    CHF (congestive heart failure)    CKD (chronic kidney disease)     CKD (chronic kidney disease) stage 3, GFR 30-59 ml/min    COPD (chronic obstructive pulmonary disease)    stage 2   DDD (degenerative disc disease), cervical    Dysrhythmia    post carotid stent bradycardia; PAF 09/2020   GERD (gastroesophageal reflux disease)    Hypercholesterolemia    Hypertension    Hypothyroidism    pt takes Levothyroxine daily   Lumbosacral spinal stenosis    Myasthenia gravis, adult form    PAD (peripheral artery disease)    Parkinson disease    Shortness of breath    Lung MD- Dr Joetta Manners   Sleep apnea    do not use CPAP every night    Past Surgical History:  Procedure Laterality Date   ANTERIOR CERVICAL DECOMP/DISCECTOMY FUSION  07/18/2011   Procedure: ANTERIOR CERVICAL DECOMPRESSION/DISCECTOMY FUSION 2 LEVELS;  Surgeon: Kathaleen Maser Pool;  Location: [facility];  Service: Neurosurgery;  Laterality: N/A;  cervical five-six, cervical six-seven anterior cervical discectomy and fusion   ATRIAL FIBRILLATION ABLATION     BACK SURGERY     in 1985 [facility]   BILATERAL CARPAL TUNNEL RELEASE     01/2020 Right, 04/2020 Left   CARDIAC CATHETERIZATION     2005 at [facility], no stents   CAROTID PTA/STENT INTERVENTION N/A 09/17/2020   Procedure: CAROTID PTA/STENT INTERVENTION;  Surgeon: Annice Needy, MD;  Location: [facility];  Service: Cardiovascular;  Laterality: N/A;   CATARACT  EXTRACTION W/PHACO Left 01/06/2020   Procedure: CATARACT EXTRACTION PHACO AND INTRAOCULAR LENS PLACEMENT (IOC) ISTENT INJ LEFT 3.81  00:33.3;  Surgeon: Nevada Crane, MD;  Location: [facility];  Service: Ophthalmology;  Laterality: Left;   CATARACT EXTRACTION W/PHACO Right 02/03/2020   Procedure: CATARACT EXTRACTION PHACO AND INTRAOCULAR LENS PLACEMENT (IOC) RIGHT ISTENT INJ;  Surgeon: Nevada Crane, MD;  Location: [facility];  Service: Ophthalmology;  Laterality: Right;  4.29 0:35.6   COLONOSCOPY     HERNIA REPAIR Left    inguinal hernia repair in  1985   LUMBAR LAMINECTOMY/DECOMPRESSION MICRODISCECTOMY Left 02/24/2014   Procedure: LUMBAR LAMINECTOMY/DECOMPRESSION MICRODISCECTOMY LUMBAR THREE-FOUR, FOUR-FIVE, LEFT FIVE-SACRAL ONE ;  Surgeon: Temple Pacini, MD;  Location: [facility];  Service: Neurosurgery;  Laterality: Left;  LUMBAR LAMINECTOMY/DECOMPRESSION MICRODISCECTOMY LUMBAR THREE-FOUR, FOUR-FIVE, LEFT FIVE-SACRAL ONE    LUMBAR LAMINECTOMY/DECOMPRESSION MICRODISCECTOMY N/A 05/03/2021   Procedure: Laminectomy and Foraminotomy - L2-L3;  Surgeon: Julio Sicks, MD;  Location: [facility];  Service: Neurosurgery;  Laterality: N/A;  3C   POSTERIOR CERVICAL FUSION/FORAMINOTOMY N/A 08/07/2020   Procedure: C3-6 POSTERIOR FUSION WITH DECOMPRESSION;  Surgeon: Venetia Night, MD;  Location: [facility];  Service: Neurosurgery;  Laterality: N/A;   PROSTATECTOMY  04/2013   [facility] Dr Joycelyn Das     Family History  Problem Relation Age of Onset   Hypertension Mother    Stroke Mother    Stroke Father    Social History   Tobacco Use   Smoking status: Former    Packs/day: 1.00    Years: 20.00    Additional pack years: 0.00    Total pack years: 20.00    Types: Cigarettes    Quit date: 09/12/2001    Years since quitting: 21.3   Smokeless tobacco: Never  Substance Use Topics   Alcohol use: Yes    Alcohol/week: 3.0 standard drinks of alcohol    Types: 3 Glasses of wine per week    Comment: 3 glasses a wine a week   Allergies  Allergen Reactions   Azithromycin Other (See Comments)    Avoid due to myasthenia gravis   Codeine Nausea And Vomiting   Prior to Admission medications   Medication Sig Start Date End Date Taking? Authorizing Provider  apixaban (ELIQUIS) 5 MG TABS tablet Take 5 mg by mouth 2 (two) times daily.   Yes [provider]  atorvastatin (LIPITOR) 10 MG tablet Take 1 tablet by mouth once daily 12/21/22  Yes Maeola Harman, MD  azaTHIOprine (IMURAN) 50 MG tablet Take 150 mg by mouth daily.    Yes [provider]  carbidopa-levodopa (SINEMET IR) 25-100 MG tablet Take 1 tablet by mouth 3 (three) times daily. 11/11/21  Yes [provider]  Cholecalciferol (D3-1000) 25 MCG (1000 UT) capsule Take 1,000 Units by mouth daily.   Yes [provider]  clopidogrel (PLAVIX) 75 MG tablet Take 1 tablet by mouth once daily 12/26/22  Yes Georgiana Spinner, NP  HYDROcodone-acetaminophen (NORCO) 10-325 MG tablet Take 1-2 tablets by mouth See admin instructions. Take 1 to 2 tablets every morning, may take 1 tablet every 6 hours as needed for pain   Yes [provider]  ipratropium-albuterol (DUONEB) 0.5-2.5 (3) MG/3ML SOLN Take 3 mLs by nebulization every 4 (four) hours as needed. 12/16/21  Yes [provider]  levothyroxine (SYNTHROID) 50 MCG tablet Take 1 tablet (50 mcg total) by mouth daily at 6 (six) AM. 01/01/22 01/04/23 Yes Charise Killian, MD  metoprolol tartrate (LOPRESSOR) 25 MG tablet Take 6.25 mg by mouth every other day. For racing heart.   Yes [provider]  predniSONE (DELTASONE) 10 MG tablet Take 20 mg by mouth every other day. Taking 20 mg Monday, Wed., and Friday   Yes [provider]  spironolactone (ALDACTONE) 25 MG tablet Take 25 mg by mouth daily. 11/10/21  Yes [provider]  torsemide (DEMADEX) 20 MG tablet Take 20 mg by mouth daily. 11/10/21  Yes [provider]  traZODone (DESYREL) 50 MG tablet Take 50 mg by mouth at bedtime.   Yes [provider]   Review of Systems  Constitutional:  Positive for fatigue (as the day progresses). Negative for appetite change.  HENT:  Negative for congestion, postnasal drip and sore throat.   Eyes: Negative.   Respiratory:  Positive for shortness of breath (with moderate exertion). Negative for cough, chest tightness and wheezing.   Cardiovascular:  Negative for chest pain, palpitations and leg swelling.  Gastrointestinal:  Negative for abdominal distention, abdominal pain and  constipation.  Endocrine: Negative.   Genitourinary: Negative.   Musculoskeletal:  Positive for arthralgias (right knee with radiation into the right hip). Negative for back pain and neck pain.  Skin: Negative.   Allergic/Immunologic: Negative.   Neurological:  Positive for weakness (in legs). Negative for dizziness and light-headedness.  Hematological:  Negative for adenopathy. Bruises/bleeds easily.  Psychiatric/Behavioral:  Negative for dysphoric mood and sleep disturbance (sleeping on 1 pillow; wears oxygen at 2L at bedtime). The patient is not nervous/anxious.    Vitals:   01/04/23 1253  BP: (!) 124/50  Pulse: 76  SpO2: 96%  Weight: 237 lb 9.6 oz (107.8 kg)   Wt Readings from Last 3 Encounters:  01/04/23 237 lb 9.6 oz (107.8 kg)  12/06/22 233 lb (105.7 kg)  06/17/22 238 lb 12.8 oz (108.3 kg)   Lab Results  Component Value Date   CREATININE 0.90 12/06/2022   CREATININE 2.46 (H) 04/28/2022   CREATININE 1.13 12/31/2021   Physical Exam Vitals and nursing note reviewed. Exam conducted with a chaperone present (wife).  Constitutional:      General: He is not in acute distress.    Appearance: Normal appearance. He is not ill-appearing.  HENT:     Head: Normocephalic and atraumatic.  Cardiovascular:     Rate and Rhythm: Normal rate and regular rhythm.  Pulmonary:     Effort: Pulmonary effort is normal. No respiratory distress.     Breath sounds: No wheezing or rales.  Abdominal:     General: There is no distension.     Palpations: Abdomen is soft.  Musculoskeletal:        General: No tenderness.     Cervical back: Normal range of motion and neck supple.     Right lower leg: Edema (trace) present.     Left lower leg: Edema (trace) present.  Skin:    General: Skin is warm and dry.  Neurological:     General: No focal deficit present.     Mental Status: He is alert and oriented to person, place, and time.  Psychiatric:        Mood and Affect: Mood is anxious.         Behavior: Behavior normal.        Thought Content: Thought content normal.   Assessment & Plan:  1: NICM with preserved ejection fraction- - NYHA class II - euvolemic today - weighing daily; reminded to  call for  an overnight weight gain of > 2 pounds or a weekly weight gain of > 5 pounds - weight unchanged from his last visit on 03/23 - not adding salt and has been trying to eat low sodium items; reviewed the importance of following a 2000mg  sodium diet  - will get echo updated and this was scheduled for 01/30/23; consider SGLT2 after this - drinking ~ 50 ounces of fluid daily - continue spironolactone 25mg  daily - continue torsemide 20mg  daily  - BNP 09/27/21 was 258.2  2: HTN with CKD- - BP 124/50 - saw PCP (Tumey) 12/23/22 - BMP 12/06/22 reviewed and showed sodium 142, potassium 3.6, creatinine 0.9 and GFR >60  3: COPD- - saw pulmonology Karna Christmas) 03/24 - wears oxygen at 2L at bedtime; could not tolerate CPAP  4: Myasthenia gravis/ parkinson's- - saw opthalmology Georgina Pillion) 08/11/21 - saw neurology (Paich) 01/24  5: PAF- - saw cardiology (Paraschos) 12/15/22 - ablation done 10/23; he may be changing EP providers as well - continue apixaban 5mg  BID - taking metoprolol 6.25mg  QOD   Encouraged patient to speak with current cardiology group about his concerns but he requests other cardiology group numbers. Placed on his AVS the phone numbers to both Dr. Milta Deiters office and [facility] cardiology.

## 2023-01-04 NOTE — Patient Instructions (Addendum)
Per your request:   Dr Adrian Blackwater [phone]   [facility] [phone]   Your physician has requested that you have an echocardiogram. Echocardiography is a painless test that uses sound waves to create images of your heart. It provides your doctor with information about the size and shape of your heart and how well your heart's chambers and valves are working. This procedure takes approximately one hour. There are no restrictions for this procedure. Please do NOT wear cologne, perfume, aftershave, or lotions (deodorant is allowed). Please arrive 15 minutes prior to your appointment time.

## 2023-01-05 ENCOUNTER — Ambulatory Visit: Payer: Medicare Other

## 2023-01-05 DIAGNOSIS — R269 Unspecified abnormalities of gait and mobility: Secondary | ICD-10-CM

## 2023-01-05 DIAGNOSIS — G959 Disease of spinal cord, unspecified: Secondary | ICD-10-CM

## 2023-01-05 DIAGNOSIS — R262 Difficulty in walking, not elsewhere classified: Secondary | ICD-10-CM | POA: Diagnosis not present

## 2023-01-05 DIAGNOSIS — R2681 Unsteadiness on feet: Secondary | ICD-10-CM

## 2023-01-05 DIAGNOSIS — R2689 Other abnormalities of gait and mobility: Secondary | ICD-10-CM

## 2023-01-05 DIAGNOSIS — R296 Repeated falls: Secondary | ICD-10-CM

## 2023-01-05 DIAGNOSIS — G8929 Other chronic pain: Secondary | ICD-10-CM

## 2023-01-05 DIAGNOSIS — M6281 Muscle weakness (generalized): Secondary | ICD-10-CM

## 2023-01-05 DIAGNOSIS — R278 Other lack of coordination: Secondary | ICD-10-CM

## 2023-01-05 NOTE — Therapy (Signed)
OUTPATIENT PHYSICAL THERAPY TREATMENT NOTE     Patient Name: Francisco [name] MRN: [id number] DOB:July 11, 1946, 77 y.o., male Today's Date: 01/06/2023  PCP: Lynnea Ferrier, MD REFERRING PROVIDER: Venetia Night MD   PT End of Session - 01/05/23 1523     Visit Number 59    Number of Visits 74    Date for PT Re-Evaluation 01/31/23    Authorization Type [employer] Medicare    Authorization Time Period 08/11/22-11/03/2022    Progress Note Due on Visit 60    PT Start Time 1517    PT Stop Time 1555    PT Time Calculation (min) 38 min    Equipment Utilized During Treatment Gait belt    Activity Tolerance Patient tolerated treatment well;No increased pain;Patient limited by fatigue    Behavior During Therapy [facility] for tasks assessed/performed                    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   Carotid stenosis    CHF (congestive heart failure) (HCC)    CKD (chronic kidney disease)    CKD (chronic kidney disease) stage 3, GFR 30-59 ml/min (HCC)    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   DDD (degenerative disc disease), cervical    Dysrhythmia    post carotid stent bradycardia; PAF 09/2020   GERD (gastroesophageal reflux disease)    Hypercholesterolemia    Hypertension    Hypothyroidism    pt takes Levothyroxine daily   Lumbosacral spinal stenosis    Myasthenia gravis, adult form (HCC)    PAD (peripheral artery disease) (HCC)    Parkinson disease    Shortness of breath    Lung MD- Dr Joetta Manners   Sleep apnea    do not use CPAP every night   Past Surgical History:  Procedure Laterality Date   ANTERIOR CERVICAL DECOMP/DISCECTOMY FUSION  07/18/2011   Procedure:  ANTERIOR CERVICAL DECOMPRESSION/DISCECTOMY FUSION 2 LEVELS;  Surgeon: Kathaleen Maser Pool;  Location: [facility];  Service: Neurosurgery;  Laterality: N/A;  cervical five-six, cervical six-seven anterior cervical discectomy and fusion   ATRIAL FIBRILLATION ABLATION     BACK SURGERY     in 1985 [facility]   BILATERAL CARPAL TUNNEL RELEASE     01/2020 Right, 04/2020 Left   CARDIAC CATHETERIZATION     2005 at [facility], no stents   CAROTID PTA/STENT INTERVENTION N/A 09/17/2020   Procedure: CAROTID PTA/STENT INTERVENTION;  Surgeon: Annice Needy, MD;  Location: [facility] INVASIVE CV LAB;  Service: Cardiovascular;  Laterality: N/A;   CATARACT EXTRACTION W/PHACO Left 01/06/2020   Procedure: CATARACT EXTRACTION PHACO AND INTRAOCULAR LENS PLACEMENT (IOC) ISTENT INJ LEFT 3.81  00:33.3;  Surgeon: Nevada Crane, MD;  Location: [facility];  Service: Ophthalmology;  Laterality: Left;   CATARACT EXTRACTION W/PHACO Right 02/03/2020   Procedure: CATARACT EXTRACTION PHACO AND INTRAOCULAR LENS PLACEMENT (IOC) RIGHT ISTENT INJ;  Surgeon: Nevada Crane, MD;  Location: [facility];  Service: Ophthalmology;  Laterality: Right;  4.29 0:35.6   COLONOSCOPY     HERNIA REPAIR Left    inguinal hernia  repair in 1985   LUMBAR LAMINECTOMY/DECOMPRESSION MICRODISCECTOMY Left 02/24/2014   Procedure: LUMBAR LAMINECTOMY/DECOMPRESSION MICRODISCECTOMY LUMBAR THREE-FOUR, FOUR-FIVE, LEFT FIVE-SACRAL ONE ;  Surgeon: Temple Pacini, MD;  Location: [facility];  Service: Neurosurgery;  Laterality: Left;  LUMBAR LAMINECTOMY/DECOMPRESSION MICRODISCECTOMY LUMBAR THREE-FOUR, FOUR-FIVE, LEFT FIVE-SACRAL ONE    LUMBAR LAMINECTOMY/DECOMPRESSION MICRODISCECTOMY N/A 05/03/2021   Procedure: Laminectomy and Foraminotomy - L2-L3;  Surgeon: Julio Sicks, MD;  Location: [facility] OR;  Service: Neurosurgery;  Laterality: N/A;  3C   POSTERIOR CERVICAL FUSION/FORAMINOTOMY N/A 08/07/2020   Procedure: C3-6 POSTERIOR FUSION WITH DECOMPRESSION;  Surgeon:  Venetia Night, MD;  Location: [facility] ORS;  Service: Neurosurgery;  Laterality: N/A;   PROSTATECTOMY  04/2013   [facility] Dr Joycelyn Das    Patient Active Problem List   Diagnosis Date Noted   SVT (supraventricular tachycardia) 12/30/2021   Hypothyroidism 12/30/2021   Parkinson's disease 12/30/2021   Elevated troponin 12/30/2021   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   Acquired thrombophilia (HCC) 01/13/2021   History of decompression of median nerve 01/01/2021   Paroxysmal atrial fibrillation (HCC) 10/06/2020   Carotid stenosis, right 09/17/2020   S/P cervical spinal fusion    Leukocytosis    Essential hypertension    Anemia of chronic disease    Postoperative pain    Neuropathic pain    Cervical myelopathy (HCC) 08/07/2020   Preop cardiovascular exam 07/17/2020   SOB (shortness of breath) on exertion 07/17/2020   Leg weakness, bilateral 07/13/2020   Aortic atherosclerosis (HCC) 07/09/2020   Body mass index (BMI) 34.0-34.9, adult 12/25/2019   Myalgia 10/30/2019   Lumbar post-laminectomy syndrome 10/24/2019   PAD (peripheral artery disease) (HCC) 06/06/2019   CKD (chronic kidney disease) stage 3, GFR 30-59 ml/min (HCC) 04/29/2019   B12 deficiency 01/23/2019   Left arm numbness 02/28/2018   Neck pain 02/28/2018   Anemia 02/02/2017   DDD (degenerative disc disease), cervical 02/02/2017   Hypothyroid 02/02/2017   MRSA (methicillin resistant staph aureus) culture positive 02/02/2017   Nocturnal hypoxia 02/02/2017   Senile purpura (HCC) 10/03/2016   Essential hypertension, benign 09/16/2016   Bilateral carotid artery disease (HCC) 09/16/2016   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/18/2016   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   History of prostate cancer 12/21/2015   Left carpal tunnel syndrome 11/11/2015   Kidney stone on left side 07/05/2015   Myasthenia gravis (HCC) 05/26/2015   Radiculitis 01/05/2015   Long-term use of high-risk medication 10/15/2014    Persistent cough 09/10/2014   Pure hypercholesterolemia 07/25/2014   Spinal stenosis, lumbar region, with neurogenic claudication 02/24/2014   Lumbosacral stenosis with neurogenic claudication 02/24/2014   COPD (chronic obstructive pulmonary disease) (HCC) 01/22/2014    REFERRING DIAG: balance disorder   THERAPY DIAG:  Difficulty in walking, not elsewhere classified  Muscle weakness (generalized)  Unsteadiness on feet  Other abnormalities of gait and mobility  Chronic bilateral low back pain, unspecified whether sciatica present  Other lack of coordination  Repeated falls  Cervical myelopathy (HCC)  Abnormality of gait and mobility  Rationale for Evaluation and Treatment Rehabilitation  PERTINENT HISTORY: Sedric Guia is a 77 year old male referred to [facility] neuro for difficulty with balance. He was also just recently dx in May 2023 with Impingement syndrome of left shoulder (M75.42) Impingement syndrome of left shoulder (primary encounter diagnosis) Left shoulder pain, unspecified chronicity Nontraumatic rupture of left proximal biceps tendon Past medical hx includes Lumbar laminectomy 04/2021. C3-6 decompression, fusion on 11/26, myasthenia gravis, 2012 ACDF 5/6, 6/7;  OSA, hypothyroidism, HTN, COPD, multiple back surgeries, 2021 carpal tunnel release.  PRECAUTIONS: Fall  SUBJECTIVE: Patient reports feeling better this week. States having more energy.   PAIN:  Are you having pain? None reported  TODAY'S TREATMENT:  01/05/2023   Therex:  Seated knee ext (3# AW on right and no weight on left LE) x 12 reps Seated Knee flex- GTB x 12 reps each LE Gait using SPC x 100 feet  Step tap onto 2 " block with 2.5 # AW on left and no weight on right x 15 reps each LE Sit to stand with orange ball x 10 reps (patient reports as tough)  Gait using SPC x 100 feet   Seated hip march alt LE x 12 reps Gait with SPC x 100 feet            Pt educated throughout session  about proper posture and technique with exercises. Improved exercise technique, movement at target joints, use of target muscles after min to mod verbal, visual, tactile cues.   PATIENT EDUCATION: Education details: Goals, plan, exercise technique, body mechanics, energy conservation techniques  Person educated: Patient Education method: Explanation, Demonstration, Tactile cues, Verbal cues, and Handouts Education comprehension: verbalized understanding, returned demonstration, verbal cues required, and tactile cues required   HOME EXERCISE PROGRAM:  No Updates today  Access Code: [email or web address] URL: [email or web address] Date: 03/01/2022 Prepared by: Precious Bard  Exercises - Seated March  - 1 x daily - 7 x weekly - 2 sets - 10 reps - 5 hold - Seated Long Arc Quad  - 1 x daily - 7 x weekly - 2 sets - 10 reps - 5 hold - Seated Hip Abduction  - 1 x daily - 7 x weekly - 2 sets - 10 reps - 5 hold - Seated Hip Adduction Isometrics with Ball  - 1 x daily - 7 x weekly - 2 sets - 10 reps - 5 hold - Seated Gluteal Sets  - 1 x daily - 7 x weekly - 2 sets - 10 reps - 5 hold - Seated Heel Toe Raises  - 1 x daily - 7 x weekly - 2 sets - 10 reps - 5 hold LE strength and balance         PT Short Term Goals       PT SHORT TERM GOAL #1   Title Pt will be independent with initial HEP in order to improve strength and balance in order to decrease fall risk and improve function at home and work.    Baseline 02/23/2022- Patient reports not doing much as far as exercise in the home currently. 04/20/2022- Patient reports compliant with HEP and no questions at this time.; 9/7 pt indep   Time 6    Period Weeks    Status Goal met   Target Date 04/06/22              PT Long Term Goals       PT LONG TERM GOAL #1   Title Pt will be independent with final HEP in order to improve strength and balance in order to decrease fall risk and improve function at home and work. 04/20/2022- Patient  reports compliant with HEP and no questions at this time.    Baseline 02/23/2022= No formal HEP in place. 9/7: pt performing at least once a day, feels comfortable   Time 12    Period Weeks    Status GOAL MET   Target Date 08/11/2022  PT LONG TERM GOAL #2   Title Pt will decrease 5TSTS by at least 8 seconds in order to demonstrate clinically significant improvement in LE strength.    Baseline 02/23/2022= 31.52 sec with B hands on knees; 04/20/2022= 16.08 sec with B hands on knees   Time 12    Period Days    Status Goal Met   Target Date 05/18/22      PT LONG TERM GOAL #3   Title Pt will increase by at least 0.23 m/s in order to demonstrate clinically significant improvement in community ambulation.    Baseline 02/23/2022= 0.39 m/s using RW; 0.45 m/s; 9/7: 0.37 m/s with QC, 0.74 m/s with RW   Time 12    Period Weeks    Status GOAL MET   Target Date 05/18/22      PT LONG TERM GOAL #4   Title Pt will improve FOTO to target score of 50 to display perceived improvements in ability to complete ADL's.    Baseline 02/23/2022= 47; 9/7: 49; 11/30=54%   Time 12    Period Weeks    Status GOAL MET   Target Date 08/11/2022      PT LONG TERM GOAL #5   Title Pt will decrease TUG to below 20 seconds/decrease in order to demonstrate decreased fall risk.    Baseline 02/23/2022=28.27 sec with RW; 04/20/2022= 20.22 with SBQC; 9/7: 14.4 sec with RW, 18.5 sec with SBQC   Time 12    Period Weeks    Status MET   Target Date 05/18/22    PT LONG TERM GOAL #6  Title Pt will decrease TUG to 14 sec or below with SBQC in order to demonstrate decreased fall risk.   Baseline  9/7: 14.4 sec with RW, 18.5 sec with SBQC; 11/30=16.85 sec without an AD. 09/15/2022= 22.14 sec with SPC; 11/08/2022= 25.17 sec without and AD and 21.22 sec with SPC  Time 12   Period Weeks   Status ONGOING  Target Date 01/31/2023   PT LONG TERM GOAL #7  Title Patient will increase six minute walk test distance to >700 ft for  improved gait ability and increased ease with community participation.  Baseline 9/7: completes 4 minutes and 444 ft with RW. Test discontinued at 4 minutes due to fatgiue. 08/11/2022= patient ambulated 200 feet yet required 1 seated rest break- due to BLE fatigue- using SBQC: 09/15/2022= Patient ambulated 300 feet in 4 min 20 sec with 1 seated rest break using SBQC. 11/08/2022 = 175 feet- stopped due to exhausted today- 2 min 10 sec  Time 12   Period Weeks   Status ONGOING  Target Date 01/31/2023   PT LONG TERM GOAL #8  Title Pt will decrease 5TSTS by at least 2 seconds (without UE support)  in order to demonstrate clinically significant improvement in LE strength.   Baseline 08/11/2022= 16.33 sec with B hands on knees; 09/15/2022= 22 sec with B hands on knees (patient reports increased weakness since around Christmas and just has not bounced back); 11/08/2022= 18.85 without UE Support  Time 12   Period Weeks  Status ONGOING  Target Date 01/31/2023       PT LONG TERM GOAL #  Title Pt will improve BERG by at least 3 points in order to demonstrate clinically significant improvement in balance.   Baseline 11/08/2022= 33/56  Time 12   Period Weeks  Status NEW  Target Date 01/31/2023       Plan -  Clinical Impression Statement Patient performed well overall with seated and standing activities- Left LE still very weak compared to right yet able to walk without dragging left LE and much improved overall functional endurance with walking and therex. Patient will continue to benefit from skilled PT services to address deficits and impairment identified in evaluation in order to maximize independence and safety in basic mobility required for performance of ADL, IADL, and leisure.    Personal Factors and Comorbidities Age;Past/Current Experience;Time since onset of injury/illness/exacerbation;Comorbidity 3+    Comorbidities COPD, History of Cancer, myasthenia gravis, chronic lumbar surgical history     Examination-Activity Limitations Lift;Squat;Bend;Stairs;Stand;Transfers;Probation officer;Bathing;Hygiene/Grooming;Dressing;Toileting;Bed Mobility;Caring for Others;Continence    Examination-Participation Restrictions Yard Work;Church;Cleaning;Driving;Community Activity    Stability/Clinical Decision Making Evolving/Moderate complexity    Rehab Potential Good    Clinical Impairments Affecting Rehab Potential multipe comorbidities    PT Frequency 1-2x / week    PT Duration 12 weeks    PT Treatment/Interventions ADLs/Self Care Home Management;Electrical Stimulation;Moist Heat;Traction;Ultrasound;Gait training;Stair training;Functional mobility training;Therapeutic activities;Therapeutic exercise;Balance training;Neuromuscular re-education;Patient/family education;Manual techniques;Passive range of motion;Dry needling;Joint Manipulations;Cryotherapy;Vestibular;Canalith Repostioning    PT Next Visit Plan standing strengthening and balance, seated strengthening and coordination    PT Home Exercise Plan Progress LE strengthening, balance training,  and functional endurance   Consulted and Agree with Plan of Care Patient              Louis Meckel, PT Physical Therapist- [facility]  01/06/23, 11:12 AM

## 2023-01-10 ENCOUNTER — Telehealth: Payer: Self-pay

## 2023-01-10 ENCOUNTER — Ambulatory Visit: Payer: Medicare Other

## 2023-01-10 NOTE — Telephone Encounter (Signed)
Pt called to report fluctuating heart rate and blood pressure today. 730 AM: 91/61, HR 164,  Rechecked at 830 AM:  97/76 HR 168.  12 PM: 145/74, HR 73, 130 PM: 151/80, HR 80 He states he has not taken metoprolol today, as he was worried about it negatively affecting his blood pressure or heart rate. He states his primary cardiologist advised on telephone call earlier today that he take half a tablet of metoprolol, but he has not yet done so. Pt states that cardiologist did not offer him an appointment, but advised him he could go to the ED if he wants an EKG. Pt states he does want an EKG soon, but does not want to go to the ED for it.   Per Clarisa Kindred, NP: Pt can take whole tablet of metoprolol today and we can offer him an appt and check EKG.  Pt aware, agreeable, and verbalized understanding. He scheduled appt with Clarisa Kindred for tomorrow morning. He states he still does not feel comfortable taking a whole tablet of metoprolol but he will take half a tablet.  [facility] notified. Pt voiced appreciation and denies any further questions or concerns at this time.

## 2023-01-11 ENCOUNTER — Ambulatory Visit: Payer: Medicare Other | Attending: Family | Admitting: Family

## 2023-01-11 ENCOUNTER — Encounter: Payer: Self-pay | Admitting: Family

## 2023-01-11 VITALS — BP 135/65 | HR 71 | Wt 237.8 lb

## 2023-01-11 DIAGNOSIS — I480 Paroxysmal atrial fibrillation: Secondary | ICD-10-CM | POA: Diagnosis not present

## 2023-01-11 DIAGNOSIS — Z7901 Long term (current) use of anticoagulants: Secondary | ICD-10-CM | POA: Diagnosis not present

## 2023-01-11 DIAGNOSIS — Z79899 Other long term (current) drug therapy: Secondary | ICD-10-CM | POA: Diagnosis not present

## 2023-01-11 DIAGNOSIS — Z8249 Family history of ischemic heart disease and other diseases of the circulatory system: Secondary | ICD-10-CM | POA: Insufficient documentation

## 2023-01-11 DIAGNOSIS — Z87891 Personal history of nicotine dependence: Secondary | ICD-10-CM | POA: Diagnosis not present

## 2023-01-11 DIAGNOSIS — I130 Hypertensive heart and chronic kidney disease with heart failure and stage 1 through stage 4 chronic kidney disease, or unspecified chronic kidney disease: Secondary | ICD-10-CM | POA: Insufficient documentation

## 2023-01-11 DIAGNOSIS — N183 Chronic kidney disease, stage 3 unspecified: Secondary | ICD-10-CM | POA: Diagnosis not present

## 2023-01-11 DIAGNOSIS — I10 Essential (primary) hypertension: Secondary | ICD-10-CM | POA: Diagnosis not present

## 2023-01-11 DIAGNOSIS — I428 Other cardiomyopathies: Secondary | ICD-10-CM | POA: Insufficient documentation

## 2023-01-11 DIAGNOSIS — I5032 Chronic diastolic (congestive) heart failure: Secondary | ICD-10-CM | POA: Diagnosis not present

## 2023-01-11 DIAGNOSIS — J449 Chronic obstructive pulmonary disease, unspecified: Secondary | ICD-10-CM

## 2023-01-11 DIAGNOSIS — G7000 Myasthenia gravis without (acute) exacerbation: Secondary | ICD-10-CM | POA: Diagnosis not present

## 2023-01-11 DIAGNOSIS — E039 Hypothyroidism, unspecified: Secondary | ICD-10-CM | POA: Insufficient documentation

## 2023-01-11 DIAGNOSIS — Z8546 Personal history of malignant neoplasm of prostate: Secondary | ICD-10-CM | POA: Diagnosis not present

## 2023-01-11 DIAGNOSIS — G20A1 Parkinson's disease without dyskinesia, without mention of fluctuations: Secondary | ICD-10-CM | POA: Insufficient documentation

## 2023-01-11 MED ORDER — METOPROLOL SUCCINATE ER 25 MG PO TB24: 12.5000 mg | ORAL_TABLET | Freq: Every day | ORAL | 3 refills | Status: DC

## 2023-01-11 NOTE — Progress Notes (Addendum)
[facility] HEART FAILURE CLINIC - Pharmacist Education Note  Assessment Francisco [name] is a 77 y.o. male with HFpEF (EF >50%) presenting to the Heart Failure Clinic for follow up. Patient presents today for evaluation of BP. Yesterday, he checked his O2 saturation at home and noticed the pulse reading on the finger O2 saturation device was in the 160s. He then checked his BP on his home device and noticed it was low with elevated HR. He reports general fatigue over the past few weeks and "splotchy vision" around the time of the low BP reading. Patient reports this is usual for him if he has hypotension. Patient's BP readings throughout the day were somewhat labile, with SBP ranging from 90-150s and HR ranging from 70-160s. Patient states that he takes metoprolol tartrate PRN for hypertension and/or tachycardia and that he usually takes his BP every morning and night. He took a half tablet of metoprolol around 1230 yesterday with minimal effect. Patient brought home BP machine today. Manual BP was within 10 mmHg of home monitor reading  Recent ED Visit (past 6 months):  Date: 11/27/2022, CC: palpitations  Guideline-Directed Medical Therapy/Evidence Based Medicine ACE/ARB/ARNI:  none Beta Blocker: Metoprolol tartrate 6.25 mg as needed Aldosterone Antagonist: Spironolactone 25 mg daily  Diuretic: Torsemide 20 mg daily SGLT2i:  none  Adherence Assessment Do you ever forget to take your medication? [] Yes [x] No  Do you ever skip doses due to side effects? [] Yes [x] No  Do you have trouble affording your medicines? [] Yes [x] No  Are you ever unable to pick up your medication due to transportation difficulties? [] Yes [x] No  Do you ever stop taking your medications because you don't believe they are helping? [] Yes [x] No  Do you check your weight daily? [] Yes [x] No (every 2-3 days)  Adherence strategy: Part of daily routine (no specific system) Barriers to obtaining medications: None  reported  Diagnostics ECHO: Date 12/30/2021, EF 50-55%, no RWMA  Vitals    01/11/2023   11:44 AM 01/04/2023   12:53 PM 12/06/2022    3:45 PM  Vitals with BMI  Weight 237 lbs 13 oz 237 lbs 10 oz   Systolic 135 124 045  Diastolic 65 50 52  Pulse 71 76 57     Recent Labs    Latest Ref Rng & Units 12/06/2022    2:29 PM 04/28/2022   11:13 AM 12/31/2021    3:10 AM  BMP  Glucose 70 - 99 mg/dL 409  811  87   BUN 8 - 23 mg/dL 17  38  22   Creatinine 0.61 - 1.24 mg/dL 9.14  7.82  9.56   Sodium 135 - 145 mmol/L 142  139  141   Potassium 3.5 - 5.1 mmol/L 3.6  5.2  4.2   Chloride 98 - 111 mmol/L 102  103  107   CO2 22 - 32 mmol/L 30  26  29    Calcium 8.9 - 10.3 mg/dL 8.6  9.6  8.4     Past Medical History 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   Carotid stenosis    CHF (congestive heart failure) (HCC)    CKD (chronic kidney disease)  CKD (chronic kidney disease) stage 3, GFR 30-59 ml/min (HCC)    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   DDD (degenerative disc disease), cervical    Dysrhythmia    post carotid stent bradycardia; PAF 09/2020   GERD (gastroesophageal reflux disease)    Hypercholesterolemia    Hypertension    Hypothyroidism    pt takes Levothyroxine daily   Lumbosacral spinal stenosis    Myasthenia gravis, adult form (HCC)    PAD (peripheral artery disease) (HCC)    Parkinson disease    Shortness of breath    Lung MD- Dr Joetta Manners   Sleep apnea    do not use CPAP every night    Plan CHF Continue spironolactone and torsemide Discontinue PRN metoprolol tartrate Start metoprolol succinate 12.5 mg daily Repeat echocardiogram 01/30/2023 and consider SGLT2 pending  results   AF Continue apixaban  Discontinue PRN metoprolol tartrate Start metoprolol succinate 12.5 mg daily EKG today showed NSR with HR 67  Time spent: 15 minutes  Celene Squibb, PharmD PGY1 Pharmacy Resident 01/11/2023 1:51 PM

## 2023-01-11 NOTE — Progress Notes (Signed)
Patient ID: Francisco [name], male    DOB: Apr 03, 1946, 77 y.o.   MRN: [id number]  Primary cardiologist: Marcina Millard, MD (last seen 12/15/22; returns 05/24) PCP: Lynnea Ferrier, MD (last seen 12/23/22)   Francisco [name] is a 77 y/o male with a history of prostate cancer, carotid disease, hyperlipidemia, HTN, CKD, thyroid disease, arthritis, COPD, DDD, GERD, myasthenia gravis, PAD, sleep apnea, parkinson's, previous tobacco use and chronic heart failure.   Echo 12/30/21: EF 50-55% with mild LVH and mild Francisco. Echo 10/19/21: EF of 55%, moderate pulmonary HTN, mild biatrial enlargement.   Was in the ED 12/06/22 due to SVT. Converted to NSR after 1 dose of IV adenosine. CT angio negative for PE.   He presents today for an acute HF visit. He called yesterday with fluctuating HR/ BP. HR ranged from 73-168 with BP range of 91-151/61-80. He had not taken his metoprolol and was advised by cardiology to take a 1/2 tablet. He has fatigue, SOB, weakness & right sided pain along with this. Denies difficulty sleeping, abdominal distention, palpitations, pedal edema, chest pain, cough, dizziness or weight gain.  Had an ablation done 06/30/22. Was previously taking metoprolol PRN for elevated HR. Due to HTN, he was instructed to take 12.5mg  daily but he was concerned about that so he's been taking 6.25mg  every other day PRN.   He also mentions that instead of taking his trazodone, he's been taking zzzquil to help him sleep because it was recommended by a nurse friend.  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   Carotid stenosis    CHF (congestive heart failure) (HCC)    CKD (chronic kidney disease)    CKD (chronic kidney disease) stage 3,  GFR 30-59 ml/min (HCC)    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   DDD (degenerative disc disease), cervical    Dysrhythmia    post carotid stent bradycardia; PAF 09/2020   GERD (gastroesophageal reflux disease)    Hypercholesterolemia    Hypertension    Hypothyroidism    pt takes Levothyroxine daily   Lumbosacral spinal stenosis    Myasthenia gravis, adult form (HCC)    PAD (peripheral artery disease) (HCC)    Parkinson disease    Shortness of breath    Lung MD- Dr Joetta Manners   Sleep apnea    do not use CPAP every night    Past Surgical History:  Procedure Laterality Date   ANTERIOR CERVICAL DECOMP/DISCECTOMY FUSION  07/18/2011   Procedure: ANTERIOR CERVICAL DECOMPRESSION/DISCECTOMY FUSION 2 LEVELS;  Surgeon: Kathaleen Maser Pool;  Location: [facility];  Service: Neurosurgery;  Laterality: N/A;  cervical five-six, cervical six-seven anterior cervical discectomy and fusion   ATRIAL FIBRILLATION ABLATION     BACK SURGERY     in 1985 [facility]   BILATERAL CARPAL TUNNEL RELEASE     01/2020 Right, 04/2020 Left   CARDIAC CATHETERIZATION     2005 at [facility], no stents   CAROTID PTA/STENT INTERVENTION N/A 09/17/2020   Procedure: CAROTID PTA/STENT INTERVENTION;  Surgeon: Annice Needy, MD;  Location: [facility];  Service: Cardiovascular;  Laterality: N/A;  CATARACT EXTRACTION W/PHACO Left 01/06/2020   Procedure: CATARACT EXTRACTION PHACO AND INTRAOCULAR LENS PLACEMENT (IOC) ISTENT INJ LEFT 3.81  00:33.3;  Surgeon: Nevada Crane, MD;  Location: [facility];  Service: Ophthalmology;  Laterality: Left;   CATARACT EXTRACTION W/PHACO Right 02/03/2020   Procedure: CATARACT EXTRACTION PHACO AND INTRAOCULAR LENS PLACEMENT (IOC) RIGHT ISTENT INJ;  Surgeon: Nevada Crane, MD;  [facility]: [facility];  Service: Ophthalmology;  Laterality: Right;  4.29 0:35.6   COLONOSCOPY     HERNIA REPAIR Left    inguinal hernia repair in 1985   LUMBAR  LAMINECTOMY/DECOMPRESSION MICRODISCECTOMY Left 02/24/2014   Procedure: LUMBAR LAMINECTOMY/DECOMPRESSION MICRODISCECTOMY LUMBAR THREE-FOUR, FOUR-FIVE, LEFT FIVE-SACRAL ONE ;  Surgeon: Temple Pacini, MD;  Location: [facility];  Service: Neurosurgery;  Laterality: Left;  LUMBAR LAMINECTOMY/DECOMPRESSION MICRODISCECTOMY LUMBAR THREE-FOUR, FOUR-FIVE, LEFT FIVE-SACRAL ONE    LUMBAR LAMINECTOMY/DECOMPRESSION MICRODISCECTOMY N/A 05/03/2021   Procedure: Laminectomy and Foraminotomy - L2-L3;  Surgeon: Julio Sicks, MD;  Location: [facility];  Service: Neurosurgery;  Laterality: N/A;  3C   POSTERIOR CERVICAL FUSION/FORAMINOTOMY N/A 08/07/2020   Procedure: C3-6 POSTERIOR FUSION WITH DECOMPRESSION;  Surgeon: Venetia Night, MD;  Location: [facility];  Service: Neurosurgery;  Laterality: N/A;   PROSTATECTOMY  04/2013   [facility] Dr Joycelyn Das     Family History  Problem Relation Age of Onset   Hypertension Mother    Stroke Mother    Stroke Father    Social History   Tobacco Use   Smoking status: Former    Packs/day: 1.00    Years: 20.00    Additional pack years: 0.00    Total pack years: 20.00    Types: Cigarettes    Quit date: 09/12/2001    Years since quitting: 21.3   Smokeless tobacco: Never  Substance Use Topics   Alcohol use: Yes    Alcohol/week: 3.0 standard drinks of alcohol    Types: 3 Glasses of wine per week    Comment: 3 glasses a wine a week   Allergies  Allergen Reactions   Azithromycin Other (See Comments)    Avoid due to myasthenia gravis   Codeine Nausea And Vomiting   Prior to Admission medications   Medication Sig Start Date End Date Taking? Authorizing Provider  apixaban (ELIQUIS) 5 MG TABS tablet Take 5 mg by mouth 2 (two) times daily.   Yes [provider]  atorvastatin (LIPITOR) 10 MG tablet Take 1 tablet by mouth once daily 12/21/22  Yes Maeola Harman, MD  azaTHIOprine (IMURAN) 50 MG tablet Take 150 mg by mouth daily.    Yes [provider]   carbidopa-levodopa (SINEMET IR) 25-100 MG tablet Take 1 tablet by mouth 3 (three) times daily. 11/11/21  Yes [provider]  Cholecalciferol (D3-1000) 25 MCG (1000 UT) capsule Take 1,000 Units by mouth daily.   Yes [provider]  clopidogrel (PLAVIX) 75 MG tablet Take 1 tablet by mouth once daily 12/26/22  Yes Georgiana Spinner, NP  HYDROcodone-acetaminophen (NORCO) 10-325 MG tablet Take 1-2 tablets by mouth See admin instructions. Take 1 to 2 tablets every morning, may take 1 tablet every 6 hours as needed for pain   Yes [provider]  levothyroxine (SYNTHROID) 50 MCG tablet Take 1 tablet (50 mcg total) by mouth daily at 6 (six) AM. Patient taking differently: Take 50 mcg by mouth daily at 6 (six) AM. 01/01/22 01/11/23 Yes Charise Killian, MD  metoprolol tartrate (LOPRESSOR) 25 MG tablet Take 6.25 mg by  mouth every other day. For racing heart.   Yes [provider]  predniSONE (DELTASONE) 10 MG tablet Take 10 mg by mouth every other day. Taking 20 mg Monday, Wed., and Friday   Yes [provider]  spironolactone (ALDACTONE) 25 MG tablet Take 25 mg by mouth daily. 11/10/21  Yes [provider]  torsemide (DEMADEX) 20 MG tablet Take 20 mg by mouth daily. 11/10/21  Yes [provider]    Review of Systems  Constitutional:  Positive for fatigue (as the day progresses). Negative for appetite change.  HENT:  Negative for congestion, postnasal drip and sore throat.   Eyes: Negative.   Respiratory:  Positive for shortness of breath (with moderate exertion). Negative for cough, chest tightness and wheezing.   Cardiovascular:  Negative for chest pain, palpitations and leg swelling.  Gastrointestinal:  Negative for abdominal distention, abdominal pain and constipation.  Endocrine: Negative.   Genitourinary: Negative.   Musculoskeletal:  Positive for arthralgias (right knee with radiation into the right hip). Negative for back pain and neck  pain.  Skin: Negative.   Allergic/Immunologic: Negative.   Neurological:  Positive for weakness (in legs). Negative for dizziness and light-headedness.  Hematological:  Negative for adenopathy. Bruises/bleeds easily.  Psychiatric/Behavioral:  Negative for dysphoric mood and sleep disturbance (sleeping on 1 pillow; wears oxygen at 2L at bedtime). The patient is not nervous/anxious.    Vitals:   01/11/23 1144  BP: 135/65  Pulse: 71  SpO2: 96%  Weight: 237 lb 12.8 oz (107.9 kg)   Wt Readings from Last 3 Encounters:  01/11/23 237 lb 12.8 oz (107.9 kg)  01/04/23 237 lb 9.6 oz (107.8 kg)  12/06/22 233 lb (105.7 kg)   Lab Results  Component Value Date   CREATININE 0.90 12/06/2022   CREATININE 2.46 (H) 04/28/2022   CREATININE 1.13 12/31/2021   Physical Exam Vitals and nursing note reviewed. Exam conducted with a chaperone present (wife).  Constitutional:      General: He is not in acute distress.    Appearance: Normal appearance. He is not ill-appearing.  HENT:     Head: Normocephalic and atraumatic.  Cardiovascular:     Rate and Rhythm: Normal rate and regular rhythm.  Pulmonary:     Effort: Pulmonary effort is normal. No respiratory distress.     Breath sounds: No wheezing or rales.  Abdominal:     General: There is no distension.     Palpations: Abdomen is soft.  Musculoskeletal:        General: No tenderness.     Cervical back: Normal range of motion and neck supple.     Right lower leg: Edema (trace) present.     Left lower leg: Edema (trace) present.  Skin:    General: Skin is warm and dry.  Neurological:     General: No focal deficit present.     Mental Status: He is alert and oriented to person, place, and time.  Psychiatric:        Mood and Affect: Mood is anxious.        Behavior: Behavior normal.        Thought Content: Thought content normal.   Assessment & Plan:  1: NICM with preserved ejection fraction- - NYHA class II - euvolemic today - weighing  daily; reminded to  call for an overnight weight gain of > 2 pounds or a weekly weight gain of > 5 pounds - weight unchanged from his last visit here 1 week ago -  not adding salt and has been trying to eat low sodium items; reviewed the importance of following a 2000mg  sodium diet  - getting echo updated on 01/30/23; consider SGLT2 after this - drinking ~ 50 ounces of fluid daily - continue spironolactone 25mg  daily - continue torsemide 20mg  daily  - BNP 09/27/21 was 258.2 - PharmD reconciled meds w/ patient  2: HTN with CKD- - BP 135/65; rechecked manually was 122/60 - compared his home BP cuff that he brought with our manual BP check; explained that BP's can fluctuate during the day and that his anxiety and heart rate can also affect a BP reading - saw PCP (Tumey) 12/23/22 - BMP 12/06/22 reviewed and showed sodium 142, potassium 3.6, creatinine 0.9 and GFR >60  3: COPD- - saw pulmonology Karna Christmas) 03/24 - wears oxygen at 2L at bedtime; could not tolerate CPAP  4: Myasthenia gravis/ parkinson's- - saw opthalmology Georgina Pillion) 11/22 - saw neurology (Paich) 01/24  5: PAF- - saw cardiology (Paraschos) 12/15/22; he is considering changing providers - ablation done 10/23; he is considering changing EP providers  - continue apixaban 5mg  BID - stop metoprolol tartrate and begin metoprolol succinate 12.5mg  once daily - EKG today: NSR with HR 67   Advised to not take anymore zzzquil and discuss resuming his trazodone with his PCP.   Keep already scheduled f/u appt here. Return sooner if needed.

## 2023-01-11 NOTE — Patient Instructions (Signed)
Stop taking the metoprolol tartrate and you will start the new metoprolol succinate as 1/2 tablet once daily

## 2023-01-12 ENCOUNTER — Ambulatory Visit: Payer: Medicare Other

## 2023-01-17 ENCOUNTER — Ambulatory Visit: Payer: Medicare Other | Attending: Neurosurgery

## 2023-01-17 DIAGNOSIS — R262 Difficulty in walking, not elsewhere classified: Secondary | ICD-10-CM

## 2023-01-17 DIAGNOSIS — M6281 Muscle weakness (generalized): Secondary | ICD-10-CM | POA: Insufficient documentation

## 2023-01-17 DIAGNOSIS — R269 Unspecified abnormalities of gait and mobility: Secondary | ICD-10-CM | POA: Insufficient documentation

## 2023-01-17 DIAGNOSIS — R278 Other lack of coordination: Secondary | ICD-10-CM | POA: Insufficient documentation

## 2023-01-17 DIAGNOSIS — R2689 Other abnormalities of gait and mobility: Secondary | ICD-10-CM

## 2023-01-17 DIAGNOSIS — G959 Disease of spinal cord, unspecified: Secondary | ICD-10-CM | POA: Diagnosis present

## 2023-01-17 DIAGNOSIS — R2681 Unsteadiness on feet: Secondary | ICD-10-CM | POA: Diagnosis present

## 2023-01-17 DIAGNOSIS — R296 Repeated falls: Secondary | ICD-10-CM | POA: Diagnosis present

## 2023-01-17 DIAGNOSIS — M545 Low back pain, unspecified: Secondary | ICD-10-CM | POA: Diagnosis present

## 2023-01-17 DIAGNOSIS — G8929 Other chronic pain: Secondary | ICD-10-CM | POA: Diagnosis present

## 2023-01-17 NOTE — Therapy (Signed)
OUTPATIENT PHYSICAL THERAPY TREATMENT NOTE/Physical Therapy Progress Note   Dates of reporting period  11/08/2022   to   01/17/2023      Patient Name: Francisco [name] MRN: [id number] DOB:1946/01/25, 77 y.o., male Today's Date: 01/17/2023  PCP: Lynnea Ferrier, MD REFERRING PROVIDER: Venetia Night MD   PT End of Session - 01/17/23 1517     Visit Number 60    Number of Visits 74    Date for PT Re-Evaluation 01/31/23    Authorization Type [employer] Medicare    Authorization Time Period 08/11/22-11/03/2022    Progress Note Due on Visit 70    PT Start Time 1513    PT Stop Time 1559    PT Time Calculation (min) 46 min    Equipment Utilized During Treatment Gait belt    Activity Tolerance Patient tolerated treatment well;No increased pain;Patient limited by fatigue    Behavior During Therapy [facility] for tasks assessed/performed                     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   Carotid stenosis    CHF (congestive heart failure) (HCC)    CKD (chronic kidney disease)    CKD (chronic kidney disease) stage 3, GFR 30-59 ml/min (HCC)    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   DDD (degenerative disc disease), cervical    Dysrhythmia    post carotid stent bradycardia; PAF 09/2020   GERD (gastroesophageal reflux disease)    Hypercholesterolemia    Hypertension    Hypothyroidism    pt takes Levothyroxine daily   Lumbosacral spinal stenosis    Myasthenia gravis, adult form (HCC)    PAD (peripheral artery disease) (HCC)    Parkinson disease    Shortness of breath    Lung MD- Dr Joetta Manners   Sleep apnea    do not use CPAP every night   Past Surgical History:   Procedure Laterality Date   ANTERIOR CERVICAL DECOMP/DISCECTOMY FUSION  07/18/2011   Procedure: ANTERIOR CERVICAL DECOMPRESSION/DISCECTOMY FUSION 2 LEVELS;  Surgeon: Kathaleen Maser Pool;  Location: [facility];  Service: Neurosurgery;  Laterality: N/A;  cervical five-six, cervical six-seven anterior cervical discectomy and fusion   ATRIAL FIBRILLATION ABLATION     BACK SURGERY     in 1985 [facility]   BILATERAL CARPAL TUNNEL RELEASE     01/2020 Right, 04/2020 Left   CARDIAC CATHETERIZATION     2005 at [facility], no stents   CAROTID PTA/STENT INTERVENTION N/A 09/17/2020   Procedure: CAROTID PTA/STENT INTERVENTION;  Surgeon: Annice Needy, MD;  Location: [facility] INVASIVE CV LAB;  Service: Cardiovascular;  Laterality: N/A;   CATARACT EXTRACTION W/PHACO Left 01/06/2020   Procedure: CATARACT EXTRACTION PHACO AND INTRAOCULAR LENS PLACEMENT (IOC) ISTENT INJ LEFT 3.81  00:33.3;  Surgeon: Nevada Crane, MD;  Location: [facility];  Service: Ophthalmology;  Laterality: Left;   CATARACT EXTRACTION W/PHACO Right 02/03/2020   Procedure: CATARACT EXTRACTION PHACO AND INTRAOCULAR LENS PLACEMENT (IOC) RIGHT ISTENT INJ;  Surgeon: Nevada Crane, MD;  Location: [facility];  Service: Ophthalmology;  Laterality:  Right;  4.29 0:35.6   COLONOSCOPY     HERNIA REPAIR Left    inguinal hernia repair in 1985   LUMBAR LAMINECTOMY/DECOMPRESSION MICRODISCECTOMY Left 02/24/2014   Procedure: LUMBAR LAMINECTOMY/DECOMPRESSION MICRODISCECTOMY LUMBAR THREE-FOUR, FOUR-FIVE, LEFT FIVE-SACRAL ONE ;  Surgeon: Temple Pacini, MD;  Location: [facility];  Service: Neurosurgery;  Laterality: Left;  LUMBAR LAMINECTOMY/DECOMPRESSION MICRODISCECTOMY LUMBAR THREE-FOUR, FOUR-FIVE, LEFT FIVE-SACRAL ONE    LUMBAR LAMINECTOMY/DECOMPRESSION MICRODISCECTOMY N/A 05/03/2021   Procedure: Laminectomy and Foraminotomy - L2-L3;  Surgeon: Julio Sicks, MD;  Location: [facility] OR;  Service: Neurosurgery;  Laterality: N/A;  3C   POSTERIOR CERVICAL  FUSION/FORAMINOTOMY N/A 08/07/2020   Procedure: C3-6 POSTERIOR FUSION WITH DECOMPRESSION;  Surgeon: Venetia Night, MD;  Location: [facility] ORS;  Service: Neurosurgery;  Laterality: N/A;   PROSTATECTOMY  04/2013   [facility] Dr Joycelyn Das    Patient Active Problem List   Diagnosis Date Noted   SVT (supraventricular tachycardia) 12/30/2021   Hypothyroidism 12/30/2021   Parkinson's disease 12/30/2021   Elevated troponin 12/30/2021   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   Acquired thrombophilia (HCC) 01/13/2021   History of decompression of median nerve 01/01/2021   Paroxysmal atrial fibrillation (HCC) 10/06/2020   Carotid stenosis, right 09/17/2020   S/P cervical spinal fusion    Leukocytosis    Essential hypertension    Anemia of chronic disease    Postoperative pain    Neuropathic pain    Cervical myelopathy (HCC) 08/07/2020   Preop cardiovascular exam 07/17/2020   SOB (shortness of breath) on exertion 07/17/2020   Leg weakness, bilateral 07/13/2020   Aortic atherosclerosis (HCC) 07/09/2020   Body mass index (BMI) 34.0-34.9, adult 12/25/2019   Myalgia 10/30/2019   Lumbar post-laminectomy syndrome 10/24/2019   PAD (peripheral artery disease) (HCC) 06/06/2019   CKD (chronic kidney disease) stage 3, GFR 30-59 ml/min (HCC) 04/29/2019   B12 deficiency 01/23/2019   Left arm numbness 02/28/2018   Neck pain 02/28/2018   Anemia 02/02/2017   DDD (degenerative disc disease), cervical 02/02/2017   Hypothyroid 02/02/2017   MRSA (methicillin resistant staph aureus) culture positive 02/02/2017   Nocturnal hypoxia 02/02/2017   Senile purpura (HCC) 10/03/2016   Essential hypertension, benign 09/16/2016   Bilateral carotid artery disease (HCC) 09/16/2016   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/18/2016   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   History of prostate cancer 12/21/2015   Left carpal tunnel syndrome 11/11/2015   Kidney stone on left side 07/05/2015   Myasthenia  gravis (HCC) 05/26/2015   Radiculitis 01/05/2015   Long-term use of high-risk medication 10/15/2014   Persistent cough 09/10/2014   Pure hypercholesterolemia 07/25/2014   Spinal stenosis, lumbar region, with neurogenic claudication 02/24/2014   Lumbosacral stenosis with neurogenic claudication 02/24/2014   COPD (chronic obstructive pulmonary disease) (HCC) 01/22/2014    REFERRING DIAG: balance disorder   THERAPY DIAG:  Difficulty in walking, not elsewhere classified  Muscle weakness (generalized)  Unsteadiness on feet  Other abnormalities of gait and mobility  Chronic bilateral low back pain, unspecified whether sciatica present  Other lack of coordination  Repeated falls  Rationale for Evaluation and Treatment Rehabilitation  PERTINENT HISTORY: Kaydrian Hunsaker is a 77 year old male referred to [facility] neuro for difficulty with balance. He was also just recently dx in May 2023 with Impingement syndrome of left shoulder (M75.42) Impingement syndrome of left shoulder (primary encounter diagnosis) Left shoulder pain, unspecified chronicity Nontraumatic rupture of left proximal biceps tendon Past medical hx includes Lumbar laminectomy 04/2021. C3-6 decompression,  fusion on 11/26, myasthenia gravis, 2012 ACDF 5/6, 6/7; OSA, hypothyroidism, HTN, COPD, multiple back surgeries, 2021 carpal tunnel release.  PRECAUTIONS: Fall  SUBJECTIVE: Just continuing to struggle. Im going to my cardiologist and dermatologist.    PAIN:  Are you having pain? None reported  TODAY'S TREATMENT:  01/17/2023    Physical therapy treatment session today consisted of completing assessment of goals and administration of testing as demonstrated and documented in flow sheet, treatment, and goals section of this note. Addition treatments may be found below.    Therex:  Seated knee ext alt LE x 10 reps Seated hip march alt LE x 12 reps            Pt educated throughout session about proper posture  and technique with exercises. Improved exercise technique, movement at target joints, use of target muscles after min to mod verbal, visual, tactile cues.   PATIENT EDUCATION: Education details: Goals, plan, exercise technique, body mechanics, energy conservation techniques  Person educated: Patient Education method: Explanation, Demonstration, Tactile cues, Verbal cues, and Handouts Education comprehension: verbalized understanding, returned demonstration, verbal cues required, and tactile cues required   HOME EXERCISE PROGRAM:  No Updates today  Access Code: [email or web address] URL: [email or web address] Date: 03/01/2022 Prepared by: Precious Bard  Exercises - Seated March  - 1 x daily - 7 x weekly - 2 sets - 10 reps - 5 hold - Seated Long Arc Quad  - 1 x daily - 7 x weekly - 2 sets - 10 reps - 5 hold - Seated Hip Abduction  - 1 x daily - 7 x weekly - 2 sets - 10 reps - 5 hold - Seated Hip Adduction Isometrics with Ball  - 1 x daily - 7 x weekly - 2 sets - 10 reps - 5 hold - Seated Gluteal Sets  - 1 x daily - 7 x weekly - 2 sets - 10 reps - 5 hold - Seated Heel Toe Raises  - 1 x daily - 7 x weekly - 2 sets - 10 reps - 5 hold LE strength and balance         PT Short Term Goals       PT SHORT TERM GOAL #1   Title Pt will be independent with initial HEP in order to improve strength and balance in order to decrease fall risk and improve function at home and work.    Baseline 02/23/2022- Patient reports not doing much as far as exercise in the home currently. 04/20/2022- Patient reports compliant with HEP and no questions at this time.; 9/7 pt indep   Time 6    Period Weeks    Status Goal met   Target Date 04/06/22              PT Long Term Goals       PT LONG TERM GOAL #1   Title Pt will be independent with final HEP in order to improve strength and balance in order to decrease fall risk and improve function at home and work. 04/20/2022- Patient reports compliant with  HEP and no questions at this time.    Baseline 02/23/2022= No formal HEP in place. 9/7: pt performing at least once a day, feels comfortable   Time 12    Period Weeks    Status GOAL MET   Target Date 08/11/2022      PT LONG TERM GOAL #2   Title Pt will decrease 5TSTS by at least 8 seconds in order to  demonstrate clinically significant improvement in LE strength.    Baseline 02/23/2022= 31.52 sec with B hands on knees; 04/20/2022= 16.08 sec with B hands on knees   Time 12    Period Days    Status Goal Met   Target Date 05/18/22      PT LONG TERM GOAL #3   Title Pt will increase by at least 0.23 m/s in order to demonstrate clinically significant improvement in community ambulation.    Baseline 02/23/2022= 0.39 m/s using RW; 0.45 m/s; 9/7: 0.37 m/s with QC, 0.74 m/s with RW   Time 12    Period Weeks    Status GOAL MET   Target Date 05/18/22      PT LONG TERM GOAL #4   Title Pt will improve FOTO to target score of 50 to display perceived improvements in ability to complete ADL's.    Baseline 02/23/2022= 47; 9/7: 49; 11/30=54%   Time 12    Period Weeks    Status GOAL MET   Target Date 08/11/2022      PT LONG TERM GOAL #5   Title Pt will decrease TUG to below 20 seconds/decrease in order to demonstrate decreased fall risk.    Baseline 02/23/2022=28.27 sec with RW; 04/20/2022= 20.22 with SBQC; 9/7: 14.4 sec with RW, 18.5 sec with SBQC   Time 12    Period Weeks    Status MET   Target Date 05/18/22    PT LONG TERM GOAL #6  Title Pt will decrease TUG to 14 sec or below with SBQC in order to demonstrate decreased fall risk.   Baseline  9/7: 14.4 sec with RW, 18.5 sec with SBQC; 11/30=16.85 sec without an AD. 09/15/2022= 22.14 sec with SPC; 11/08/2022= 25.17 sec without and AD and 21.22 sec with SPC.   Time 12   Period Weeks   Status ONGOING  Target Date 01/31/2023   PT LONG TERM GOAL #7  Title Patient will increase six minute walk test distance to >700 ft for improved gait ability and  increased ease with community participation.  Baseline 9/7: completes 4 minutes and 444 ft with RW. Test discontinued at 4 minutes due to fatgiue. 08/11/2022= patient ambulated 200 feet yet required 1 seated rest break- due to BLE fatigue- using SBQC: 09/15/2022= Patient ambulated 300 feet in 4 min 20 sec with 1 seated rest break using SBQC. 11/08/2022 = 175 feet- stopped due to exhausted today- 2 min 10 sec; 01/17/2023= 200 feet in 2 min 38 sec  Time 12   Period Weeks   Status ONGOING  Target Date 01/31/2023   PT LONG TERM GOAL #8  Title Pt will decrease 5TSTS by at least 2 seconds (without UE support)  in order to demonstrate clinically significant improvement in LE strength.   Baseline 08/11/2022= 16.33 sec with B hands on knees; 09/15/2022= 22 sec with B hands on knees (patient reports increased weakness since around Christmas and just has not bounced back); 11/08/2022= 18.85 without UE Support; 01/17/2023=30.0 sec without UE support- patient reported not having a good day but tried his best.   Time 12   Period Weeks  Status ONGOING  Target Date 01/31/2023       PT LONG TERM GOAL #  Title Pt will improve BERG by at least 3 points in order to demonstrate clinically significant improvement in balance.   Baseline 11/08/2022= 33/56; 01/17/2023= 45/56  Time 12   Period Weeks  Status PROGRESSING   Target Date 01/31/2023  Plan -     Clinical Impression Statement Patient presents today with good motivation despite not feeling well for progress note visit. He did demo improved overall balance- scoring significantly higher on BERG balance test with no recent falls. He was able to complete 5x STS despite not feeling well today and able to improve slightly with walking distance. Despite these gains he remains functionally weak and continues to inconsistencies in his abilities. Patient's condition has the potential to improve in response to therapy. Maximum improvement is yet to be obtained. The anticipated  improvement is attainable and reasonable in a generally predictable time.  Patient will continue to benefit from skilled PT services to address deficits and impairment identified in evaluation in order to maximize independence and safety in basic mobility required for performance of ADL, IADL, and leisure.    Personal Factors and Comorbidities Age;Past/Current Experience;Time since onset of injury/illness/exacerbation;Comorbidity 3+    Comorbidities COPD, History of Cancer, myasthenia gravis, chronic lumbar surgical history    Examination-Activity Limitations Lift;Squat;Bend;Stairs;Stand;Transfers;Probation officer;Bathing;Hygiene/Grooming;Dressing;Toileting;Bed Mobility;Caring for Others;Continence    Examination-Participation Restrictions Yard Work;Church;Cleaning;Driving;Community Activity    Stability/Clinical Decision Making Evolving/Moderate complexity    Rehab Potential Good    Clinical Impairments Affecting Rehab Potential multipe comorbidities    PT Frequency 1-2x / week    PT Duration 12 weeks    PT Treatment/Interventions ADLs/Self Care Home Management;Electrical Stimulation;Moist Heat;Traction;Ultrasound;Gait training;Stair training;Functional mobility training;Therapeutic activities;Therapeutic exercise;Balance training;Neuromuscular re-education;Patient/family education;Manual techniques;Passive range of motion;Dry needling;Joint Manipulations;Cryotherapy;Vestibular;Canalith Repostioning    PT Next Visit Plan standing strengthening and balance, seated strengthening and coordination    PT Home Exercise Plan Progress LE strengthening, balance training,  and functional endurance   Consulted and Agree with Plan of Care Patient              Louis Meckel, PT Physical Therapist- [facility]  01/17/23, 5:06 PM

## 2023-01-19 ENCOUNTER — Ambulatory Visit: Payer: Medicare Other

## 2023-01-20 ENCOUNTER — Ambulatory Visit: Payer: Medicare Other

## 2023-01-20 DIAGNOSIS — R278 Other lack of coordination: Secondary | ICD-10-CM

## 2023-01-20 DIAGNOSIS — R296 Repeated falls: Secondary | ICD-10-CM

## 2023-01-20 DIAGNOSIS — R262 Difficulty in walking, not elsewhere classified: Secondary | ICD-10-CM

## 2023-01-20 DIAGNOSIS — M6281 Muscle weakness (generalized): Secondary | ICD-10-CM

## 2023-01-20 DIAGNOSIS — G959 Disease of spinal cord, unspecified: Secondary | ICD-10-CM

## 2023-01-20 DIAGNOSIS — R2681 Unsteadiness on feet: Secondary | ICD-10-CM

## 2023-01-20 DIAGNOSIS — M545 Low back pain, unspecified: Secondary | ICD-10-CM

## 2023-01-20 DIAGNOSIS — R269 Unspecified abnormalities of gait and mobility: Secondary | ICD-10-CM

## 2023-01-20 DIAGNOSIS — R2689 Other abnormalities of gait and mobility: Secondary | ICD-10-CM

## 2023-01-20 NOTE — Therapy (Signed)
OUTPATIENT PHYSICAL THERAPY TREATMENT NOTE     Patient Name: Francisco [name] MRN: [id number] DOB:[birth year], 77 y.o., male Today's Date: 01/20/2023  PCP: Lynnea Ferrier, MD REFERRING PROVIDER: Venetia Night MD   PT End of Session - 01/20/23 1011     Visit Number 61    Number of Visits 74    Date for PT Re-Evaluation 01/31/23    Authorization Type [employer] Medicare    Authorization Time Period 08/11/22-11/03/2022    Progress Note Due on Visit 70    PT Start Time 1015    PT Stop Time 1058    PT Time Calculation (min) 43 min    Equipment Utilized During Treatment Gait belt    Activity Tolerance Patient tolerated treatment well;No increased pain;Patient limited by fatigue    Behavior During Therapy [facility] for tasks assessed/performed                     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   Carotid stenosis    CHF (congestive heart failure) (HCC)    CKD (chronic kidney disease)    CKD (chronic kidney disease) stage 3, GFR 30-59 ml/min (HCC)    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   DDD (degenerative disc disease), cervical    Dysrhythmia    post carotid stent bradycardia; PAF 09/2020   GERD (gastroesophageal reflux disease)    Hypercholesterolemia    Hypertension    Hypothyroidism    pt takes Levothyroxine daily   Lumbosacral spinal stenosis    Myasthenia gravis, adult form (HCC)    PAD (peripheral artery disease) (HCC)    Parkinson disease    Shortness of breath    Lung MD- Dr Joetta Manners   Sleep apnea    do not use CPAP every night   Past Surgical History:  Procedure Laterality Date   ANTERIOR CERVICAL DECOMP/DISCECTOMY FUSION  07/18/2011    Procedure: ANTERIOR CERVICAL DECOMPRESSION/DISCECTOMY FUSION 2 LEVELS;  Surgeon: Kathaleen Maser Pool;  Location: [facility];  Service: Neurosurgery;  Laterality: N/A;  cervical five-six, cervical six-seven anterior cervical discectomy and fusion   ATRIAL FIBRILLATION ABLATION     BACK SURGERY     in 1985 [facility]   BILATERAL CARPAL TUNNEL RELEASE     01/2020 Right, 04/2020 Left   CARDIAC CATHETERIZATION     2005 at [facility], no stents   CAROTID PTA/STENT INTERVENTION N/A 09/17/2020   Procedure: CAROTID PTA/STENT INTERVENTION;  Surgeon: Annice Needy, MD;  Location: [facility] INVASIVE CV LAB;  Service: Cardiovascular;  Laterality: N/A;   CATARACT EXTRACTION W/PHACO Left 01/06/2020   Procedure: CATARACT EXTRACTION PHACO AND INTRAOCULAR LENS PLACEMENT (IOC) ISTENT INJ LEFT 3.81  00:33.3;  Surgeon: Nevada Crane, MD;  Location: [facility];  Service: Ophthalmology;  Laterality: Left;   CATARACT EXTRACTION W/PHACO Right 02/03/2020   Procedure: CATARACT EXTRACTION PHACO AND INTRAOCULAR LENS PLACEMENT (IOC) RIGHT ISTENT INJ;  Surgeon: Nevada Crane, MD;  Location: [facility];  Service: Ophthalmology;  Laterality: Right;  4.29 0:35.6   COLONOSCOPY     HERNIA REPAIR Left    inguinal  hernia repair in 1985   LUMBAR LAMINECTOMY/DECOMPRESSION MICRODISCECTOMY Left 02/24/2014   Procedure: LUMBAR LAMINECTOMY/DECOMPRESSION MICRODISCECTOMY LUMBAR THREE-FOUR, FOUR-FIVE, LEFT FIVE-SACRAL ONE ;  Surgeon: Temple Pacini, MD;  Location: [facility];  Service: Neurosurgery;  Laterality: Left;  LUMBAR LAMINECTOMY/DECOMPRESSION MICRODISCECTOMY LUMBAR THREE-FOUR, FOUR-FIVE, LEFT FIVE-SACRAL ONE    LUMBAR LAMINECTOMY/DECOMPRESSION MICRODISCECTOMY N/A 05/03/2021   Procedure: Laminectomy and Foraminotomy - L2-L3;  Surgeon: Julio Sicks, MD;  Location: [facility];  Service: Neurosurgery;  Laterality: N/A;  3C   POSTERIOR CERVICAL FUSION/FORAMINOTOMY N/A 08/07/2020   Procedure: C3-6 POSTERIOR FUSION WITH  DECOMPRESSION;  Surgeon: Venetia Night, MD;  Location: [facility] ORS;  Service: Neurosurgery;  Laterality: N/A;   PROSTATECTOMY  04/2013   [facility] Dr Joycelyn Das    Patient Active Problem List   Diagnosis Date Noted   SVT (supraventricular tachycardia) 12/30/2021   Hypothyroidism 12/30/2021   Parkinson's disease 12/30/2021   Elevated troponin 12/30/2021   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   Acquired thrombophilia (HCC) 01/13/2021   History of decompression of median nerve 01/01/2021   Paroxysmal atrial fibrillation (HCC) 10/06/2020   Carotid stenosis, right 09/17/2020   S/P cervical spinal fusion    Leukocytosis    Essential hypertension    Anemia of chronic disease    Postoperative pain    Neuropathic pain    Cervical myelopathy (HCC) 08/07/2020   Preop cardiovascular exam 07/17/2020   SOB (shortness of breath) on exertion 07/17/2020   Leg weakness, bilateral 07/13/2020   Aortic atherosclerosis (HCC) 07/09/2020   Body mass index (BMI) 34.0-34.9, adult 12/25/2019   Myalgia 10/30/2019   Lumbar post-laminectomy syndrome 10/24/2019   PAD (peripheral artery disease) (HCC) 06/06/2019   CKD (chronic kidney disease) stage 3, GFR 30-59 ml/min (HCC) 04/29/2019   B12 deficiency 01/23/2019   Left arm numbness 02/28/2018   Neck pain 02/28/2018   Anemia 02/02/2017   DDD (degenerative disc disease), cervical 02/02/2017   Hypothyroid 02/02/2017   MRSA (methicillin resistant staph aureus) culture positive 02/02/2017   Nocturnal hypoxia 02/02/2017   Senile purpura (HCC) 10/03/2016   Essential hypertension, benign 09/16/2016   Bilateral carotid artery disease (HCC) 09/16/2016   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/18/2016   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   History of prostate cancer 12/21/2015   Left carpal tunnel syndrome 11/11/2015   Kidney stone on left side 07/05/2015   Myasthenia gravis (HCC) 05/26/2015   Radiculitis 01/05/2015   Long-term use of  high-risk medication 10/15/2014   Persistent cough 09/10/2014   Pure hypercholesterolemia 07/25/2014   Spinal stenosis, lumbar region, with neurogenic claudication 02/24/2014   Lumbosacral stenosis with neurogenic claudication 02/24/2014   COPD (chronic obstructive pulmonary disease) (HCC) 01/22/2014    REFERRING DIAG: balance disorder   THERAPY DIAG:  Difficulty in walking, not elsewhere classified  Muscle weakness (generalized)  Unsteadiness on feet  Other abnormalities of gait and mobility  Chronic bilateral low back pain, unspecified whether sciatica present  Other lack of coordination  Repeated falls  Cervical myelopathy (HCC)  Abnormality of gait and mobility  Rationale for Evaluation and Treatment Rehabilitation  PERTINENT HISTORY: Audry Cullers is a 77 year old male referred to [facility] neuro for difficulty with balance. He was also just recently dx in May 2023 with Impingement syndrome of left shoulder (M75.42) Impingement syndrome of left shoulder (primary encounter diagnosis) Left shoulder pain, unspecified chronicity Nontraumatic rupture of left proximal biceps tendon Past medical hx includes Lumbar laminectomy 04/2021. C3-6 decompression, fusion on 11/26, myasthenia gravis, 2012 ACDF 5/6,  6/7; OSA, hypothyroidism, HTN, COPD, multiple back surgeries, 2021 carpal tunnel release.  PRECAUTIONS: Fall  SUBJECTIVE: Just continuing to struggle. Im going to my cardiologist and dermatologist.    PAIN:  Are you having pain? None reported  TODAY'S TREATMENT:   01/20/23  THEREX:   Circuit style workout: resting HR=63 bpm  Round 1:  Posture: Horizontal ABD with GTB x 10 reps  LE: Seated LAQ each LE x 10 reps Gait: with SBQC-50 feet- 71 bpm O2 sat = 95%  Round 2:   Posture: Horizontal ABD with GTB x 10 reps LE: Seated LAQ each LE x 10 reps Gait: with SBQC 67 feet- HR= 79 and O2 sat= 93%  Round 3:   Posture: Scap row with GTB x 10 reps LE: Seated step onto 6 in  box x 10 reps each LE Gait: with SBQC 75 feet - HR= 74 bpm and O2 sat = 95%  Round 3:   Posture: Scap row with GTB x 10 reps LE: Seated step onto 6 in box x 10 reps each LE Gait: with SBQC 75 feet - HR= 74 bpm and O2 sat = 95%              Pt educated throughout session about proper posture and technique with exercises. Improved exercise technique, movement at target joints, use of target muscles after min to mod verbal, visual, tactile cues.   PATIENT EDUCATION: Education details: Goals, plan, exercise technique, body mechanics, energy conservation techniques  Person educated: Patient Education method: Explanation, Demonstration, Tactile cues, Verbal cues, and Handouts Education comprehension: verbalized understanding, returned demonstration, verbal cues required, and tactile cues required   HOME EXERCISE PROGRAM:  No Updates today  Access Code: [email or web address] URL: [email or web address] Date: 03/01/2022 Prepared by: Precious Bard  Exercises - Seated March  - 1 x daily - 7 x weekly - 2 sets - 10 reps - 5 hold - Seated Long Arc Quad  - 1 x daily - 7 x weekly - 2 sets - 10 reps - 5 hold - Seated Hip Abduction  - 1 x daily - 7 x weekly - 2 sets - 10 reps - 5 hold - Seated Hip Adduction Isometrics with Ball  - 1 x daily - 7 x weekly - 2 sets - 10 reps - 5 hold - Seated Gluteal Sets  - 1 x daily - 7 x weekly - 2 sets - 10 reps - 5 hold - Seated Heel Toe Raises  - 1 x daily - 7 x weekly - 2 sets - 10 reps - 5 hold LE strength and balance         PT Short Term Goals       PT SHORT TERM GOAL #1   Title Pt will be independent with initial HEP in order to improve strength and balance in order to decrease fall risk and improve function at home and work.    Baseline 02/23/2022- Patient reports not doing much as far as exercise in the home currently. 04/20/2022- Patient reports compliant with HEP and no questions at this time.; 9/7 pt indep   Time 6    Period Weeks     Status Goal met   Target Date 04/06/22              PT Long Term Goals       PT LONG TERM GOAL #1   Title Pt will be independent with final HEP in order to improve strength and balance in order to  decrease fall risk and improve function at home and work. 04/20/2022- Patient reports compliant with HEP and no questions at this time.    Baseline 02/23/2022= No formal HEP in place. 9/7: pt performing at least once a day, feels comfortable   Time 12    Period Weeks    Status GOAL MET   Target Date 08/11/2022      PT LONG TERM GOAL #2   Title Pt will decrease 5TSTS by at least 8 seconds in order to demonstrate clinically significant improvement in LE strength.    Baseline 02/23/2022= 31.52 sec with B hands on knees; 04/20/2022= 16.08 sec with B hands on knees   Time 12    Period Days    Status Goal Met   Target Date 05/18/22      PT LONG TERM GOAL #3   Title Pt will increase by at least 0.23 m/s in order to demonstrate clinically significant improvement in community ambulation.    Baseline 02/23/2022= 0.39 m/s using RW; 0.45 m/s; 9/7: 0.37 m/s with QC, 0.74 m/s with RW   Time 12    Period Weeks    Status GOAL MET   Target Date 05/18/22      PT LONG TERM GOAL #4   Title Pt will improve FOTO to target score of 50 to display perceived improvements in ability to complete ADL's.    Baseline 02/23/2022= 47; 9/7: 49; 11/30=54%   Time 12    Period Weeks    Status GOAL MET   Target Date 08/11/2022      PT LONG TERM GOAL #5   Title Pt will decrease TUG to below 20 seconds/decrease in order to demonstrate decreased fall risk.    Baseline 02/23/2022=28.27 sec with RW; 04/20/2022= 20.22 with SBQC; 9/7: 14.4 sec with RW, 18.5 sec with SBQC   Time 12    Period Weeks    Status MET   Target Date 05/18/22    PT LONG TERM GOAL #6  Title Pt will decrease TUG to 14 sec or below with SBQC in order to demonstrate decreased fall risk.   Baseline  9/7: 14.4 sec with RW, 18.5 sec with SBQC;  11/30=16.85 sec without an AD. 09/15/2022= 22.14 sec with SPC; 11/08/2022= 25.17 sec without and AD and 21.22 sec with SPC.   Time 12   Period Weeks   Status ONGOING  Target Date 01/31/2023   PT LONG TERM GOAL #7  Title Patient will increase six minute walk test distance to >700 ft for improved gait ability and increased ease with community participation.  Baseline 9/7: completes 4 minutes and 444 ft with RW. Test discontinued at 4 minutes due to fatgiue. 08/11/2022= patient ambulated 200 feet yet required 1 seated rest break- due to BLE fatigue- using SBQC: 09/15/2022= Patient ambulated 300 feet in 4 min 20 sec with 1 seated rest break using SBQC. 11/08/2022 = 175 feet- stopped due to exhausted today- 2 min 10 sec; 01/17/2023= 200 feet in 2 min 38 sec  Time 12   Period Weeks   Status ONGOING  Target Date 01/31/2023   PT LONG TERM GOAL #8  Title Pt will decrease 5TSTS by at least 2 seconds (without UE support)  in order to demonstrate clinically significant improvement in LE strength.   Baseline 08/11/2022= 16.33 sec with B hands on knees; 09/15/2022= 22 sec with B hands on knees (patient reports increased weakness since around Christmas and just has not bounced back); 11/08/2022= 18.85 without UE  Support; 01/17/2023=30.0 sec without UE support- patient reported not having a good day but tried his best.   Time 12   Period Weeks  Status ONGOING  Target Date 01/31/2023       PT LONG TERM GOAL #  Title Pt will improve BERG by at least 3 points in order to demonstrate clinically significant improvement in balance.   Baseline 11/08/2022= 33/56; 01/17/2023= 45/56  Time 12   Period Weeks  Status PROGRESSING   Target Date 01/31/2023       Plan -     Clinical Impression Statement Patient arrived in good spirits today and todays treatment focused on circuit style training to incorporate UE/LE and some endurance walking. He endorsed weakness and fatigue but was able to complete 4 rounds today- exhibiting most  difficulty with hip march onto step due to very weak hip flexors. He was able to take more reciprocal steps and able to walk well with use of cane. Patient will continue to benefit from skilled PT services to address deficits and impairment identified in evaluation in order to maximize independence and safety in basic mobility required for performance of ADL, IADL, and leisure.    Personal Factors and Comorbidities Age;Past/Current Experience;Time since onset of injury/illness/exacerbation;Comorbidity 3+    Comorbidities COPD, History of Cancer, myasthenia gravis, chronic lumbar surgical history    Examination-Activity Limitations Lift;Squat;Bend;Stairs;Stand;Transfers;Probation officer;Bathing;Hygiene/Grooming;Dressing;Toileting;Bed Mobility;Caring for Others;Continence    Examination-Participation Restrictions Yard Work;Church;Cleaning;Driving;Community Activity    Stability/Clinical Decision Making Evolving/Moderate complexity    Rehab Potential Good    Clinical Impairments Affecting Rehab Potential multipe comorbidities    PT Frequency 1-2x / week    PT Duration 12 weeks    PT Treatment/Interventions ADLs/Self Care Home Management;Electrical Stimulation;Moist Heat;Traction;Ultrasound;Gait training;Stair training;Functional mobility training;Therapeutic activities;Therapeutic exercise;Balance training;Neuromuscular re-education;Patient/family education;Manual techniques;Passive range of motion;Dry needling;Joint Manipulations;Cryotherapy;Vestibular;Canalith Repostioning    PT Next Visit Plan standing strengthening and balance, seated strengthening and coordination    PT Home Exercise Plan Progress LE strengthening, balance training,  and functional endurance   Consulted and Agree with Plan of Care Patient              Louis Meckel, PT Physical Therapist- [facility]  01/20/23, 11:10 AM

## 2023-01-23 ENCOUNTER — Other Ambulatory Visit: Payer: Self-pay | Admitting: Neurosurgery

## 2023-01-23 DIAGNOSIS — G959 Disease of spinal cord, unspecified: Secondary | ICD-10-CM

## 2023-01-23 DIAGNOSIS — M4714 Other spondylosis with myelopathy, thoracic region: Secondary | ICD-10-CM

## 2023-01-23 DIAGNOSIS — Z981 Arthrodesis status: Secondary | ICD-10-CM

## 2023-01-23 DIAGNOSIS — M48062 Spinal stenosis, lumbar region with neurogenic claudication: Secondary | ICD-10-CM

## 2023-01-24 ENCOUNTER — Ambulatory Visit: Payer: Medicare Other

## 2023-01-24 DIAGNOSIS — G8929 Other chronic pain: Secondary | ICD-10-CM

## 2023-01-24 DIAGNOSIS — R262 Difficulty in walking, not elsewhere classified: Secondary | ICD-10-CM

## 2023-01-24 DIAGNOSIS — R296 Repeated falls: Secondary | ICD-10-CM

## 2023-01-24 DIAGNOSIS — R2689 Other abnormalities of gait and mobility: Secondary | ICD-10-CM

## 2023-01-24 DIAGNOSIS — R2681 Unsteadiness on feet: Secondary | ICD-10-CM

## 2023-01-24 DIAGNOSIS — R278 Other lack of coordination: Secondary | ICD-10-CM

## 2023-01-24 DIAGNOSIS — M6281 Muscle weakness (generalized): Secondary | ICD-10-CM

## 2023-01-24 DIAGNOSIS — G959 Disease of spinal cord, unspecified: Secondary | ICD-10-CM

## 2023-01-24 DIAGNOSIS — R269 Unspecified abnormalities of gait and mobility: Secondary | ICD-10-CM

## 2023-01-24 NOTE — Therapy (Signed)
OUTPATIENT PHYSICAL THERAPY TREATMENT NOTE     Patient Name: Francisco [name] MRN: [id number] DOB:1946-06-03, 77 y.o., male Today's Date: 01/25/2023  PCP: Lynnea Ferrier, MD REFERRING PROVIDER: Venetia Night MD   PT End of Session - 01/24/23 1522     Visit Number 62    Number of Visits 74    Date for PT Re-Evaluation 01/31/23    Authorization Type [employer] Medicare    Authorization Time Period 08/11/22-11/03/2022    Progress Note Due on Visit 70    PT Start Time 1517    PT Stop Time 1556    PT Time Calculation (min) 39 min    Equipment Utilized During Treatment Gait belt    Activity Tolerance Patient tolerated treatment well;No increased pain;Patient limited by fatigue    Behavior During Therapy [facility] for tasks assessed/performed                     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   Carotid stenosis    CHF (congestive heart failure) (HCC)    CKD (chronic kidney disease)    CKD (chronic kidney disease) stage 3, GFR 30-59 ml/min (HCC)    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   DDD (degenerative disc disease), cervical    Dysrhythmia    post carotid stent bradycardia; PAF 09/2020   GERD (gastroesophageal reflux disease)    Hypercholesterolemia    Hypertension    Hypothyroidism    pt takes Levothyroxine daily   Lumbosacral spinal stenosis    Myasthenia gravis, adult form (HCC)    PAD (peripheral artery disease) (HCC)    Parkinson disease    Shortness of breath    Lung MD- Dr Joetta Manners   Sleep apnea    do not use CPAP every night   Past Surgical History:  Procedure Laterality Date   ANTERIOR CERVICAL DECOMP/DISCECTOMY FUSION  07/18/2011    Procedure: ANTERIOR CERVICAL DECOMPRESSION/DISCECTOMY FUSION 2 LEVELS;  Surgeon: Kathaleen Maser Pool;  Location: [facility];  Service: Neurosurgery;  Laterality: N/A;  cervical five-six, cervical six-seven anterior cervical discectomy and fusion   ATRIAL FIBRILLATION ABLATION     BACK SURGERY     in 1985 [facility]   BILATERAL CARPAL TUNNEL RELEASE     01/2020 Right, 04/2020 Left   CARDIAC CATHETERIZATION     2005 at [facility], no stents   CAROTID PTA/STENT INTERVENTION N/A 09/17/2020   Procedure: CAROTID PTA/STENT INTERVENTION;  Surgeon: Annice Needy, MD;  Location: [facility] INVASIVE CV LAB;  Service: Cardiovascular;  Laterality: N/A;   CATARACT EXTRACTION W/PHACO Left 01/06/2020   Procedure: CATARACT EXTRACTION PHACO AND INTRAOCULAR LENS PLACEMENT (IOC) ISTENT INJ LEFT 3.81  00:33.3;  Surgeon: Nevada Crane, MD;  Location: [facility];  Service: Ophthalmology;  Laterality: Left;   CATARACT EXTRACTION W/PHACO Right 02/03/2020   Procedure: CATARACT EXTRACTION PHACO AND INTRAOCULAR LENS PLACEMENT (IOC) RIGHT ISTENT INJ;  Surgeon: Nevada Crane, MD;  Location: [facility];  Service: Ophthalmology;  Laterality: Right;  4.29 0:35.6   COLONOSCOPY     HERNIA REPAIR Left    inguinal  hernia repair in 1985   LUMBAR LAMINECTOMY/DECOMPRESSION MICRODISCECTOMY Left 02/24/2014   Procedure: LUMBAR LAMINECTOMY/DECOMPRESSION MICRODISCECTOMY LUMBAR THREE-FOUR, FOUR-FIVE, LEFT FIVE-SACRAL ONE ;  Surgeon: Temple Pacini, MD;  Location: [facility];  Service: Neurosurgery;  Laterality: Left;  LUMBAR LAMINECTOMY/DECOMPRESSION MICRODISCECTOMY LUMBAR THREE-FOUR, FOUR-FIVE, LEFT FIVE-SACRAL ONE    LUMBAR LAMINECTOMY/DECOMPRESSION MICRODISCECTOMY N/A 05/03/2021   Procedure: Laminectomy and Foraminotomy - L2-L3;  Surgeon: Julio Sicks, MD;  Location: [facility] OR;  Service: Neurosurgery;  Laterality: N/A;  3C   POSTERIOR CERVICAL FUSION/FORAMINOTOMY N/A 08/07/2020   Procedure: C3-6 POSTERIOR FUSION WITH  DECOMPRESSION;  Surgeon: Venetia Night, MD;  Location: [facility] ORS;  Service: Neurosurgery;  Laterality: N/A;   PROSTATECTOMY  04/2013   [facility] Dr Joycelyn Das    Patient Active Problem List   Diagnosis Date Noted   SVT (supraventricular tachycardia) 12/30/2021   Hypothyroidism 12/30/2021   Parkinson's disease 12/30/2021   Elevated troponin 12/30/2021   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   Acquired thrombophilia (HCC) 01/13/2021   History of decompression of median nerve 01/01/2021   Paroxysmal atrial fibrillation (HCC) 10/06/2020   Carotid stenosis, right 09/17/2020   S/P cervical spinal fusion    Leukocytosis    Essential hypertension    Anemia of chronic disease    Postoperative pain    Neuropathic pain    Cervical myelopathy (HCC) 08/07/2020   Preop cardiovascular exam 07/17/2020   SOB (shortness of breath) on exertion 07/17/2020   Leg weakness, bilateral 07/13/2020   Aortic atherosclerosis (HCC) 07/09/2020   Body mass index (BMI) 34.0-34.9, adult 12/25/2019   Myalgia 10/30/2019   Lumbar post-laminectomy syndrome 10/24/2019   PAD (peripheral artery disease) (HCC) 06/06/2019   CKD (chronic kidney disease) stage 3, GFR 30-59 ml/min (HCC) 04/29/2019   B12 deficiency 01/23/2019   Left arm numbness 02/28/2018   Neck pain 02/28/2018   Anemia 02/02/2017   DDD (degenerative disc disease), cervical 02/02/2017   Hypothyroid 02/02/2017   MRSA (methicillin resistant staph aureus) culture positive 02/02/2017   Nocturnal hypoxia 02/02/2017   Senile purpura (HCC) 10/03/2016   Essential hypertension, benign 09/16/2016   Bilateral carotid artery disease (HCC) 09/16/2016   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/18/2016   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   History of prostate cancer 12/21/2015   Left carpal tunnel syndrome 11/11/2015   Kidney stone on left side 07/05/2015   Myasthenia gravis (HCC) 05/26/2015   Radiculitis 01/05/2015   Long-term use of  high-risk medication 10/15/2014   Persistent cough 09/10/2014   Pure hypercholesterolemia 07/25/2014   Spinal stenosis, lumbar region, with neurogenic claudication 02/24/2014   Lumbosacral stenosis with neurogenic claudication 02/24/2014   COPD (chronic obstructive pulmonary disease) (HCC) 01/22/2014    REFERRING DIAG: balance disorder   THERAPY DIAG:  Difficulty in walking, not elsewhere classified  Muscle weakness (generalized)  Unsteadiness on feet  Other abnormalities of gait and mobility  Chronic bilateral low back pain, unspecified whether sciatica present  Other lack of coordination  Repeated falls  Cervical myelopathy (HCC)  Abnormality of gait and mobility  Rationale for Evaluation and Treatment Rehabilitation  PERTINENT HISTORY: Francisco [name] is a 77 year old male referred to [facility] neuro for difficulty with balance. He was also just recently dx in May 2023 with Impingement syndrome of left shoulder (M75.42) Impingement syndrome of left shoulder (primary encounter diagnosis) Left shoulder pain, unspecified chronicity Nontraumatic rupture of left proximal biceps tendon Past medical hx includes Lumbar laminectomy 04/2021. C3-6 decompression, fusion on 11/26, myasthenia gravis, 2012 ACDF 5/6,  6/7; OSA, hypothyroidism, HTN, COPD, multiple back surgeries, 2021 carpal tunnel release.  PRECAUTIONS: Fall  SUBJECTIVE: Patient reports having a better week overall so far- more energy.     PAIN:  Are you having pain? None reported  TODAY'S TREATMENT:   01/24/2023  THEREX:    Dynamic warm - up - sitting at edge of mat:   -march x 10 reps each LE  - LAQ x 10 reps  each LE  Progressive Sit to stand  25" - x 3 23" x 3 21" x 3 From standard chair wihtout arm rest x 3  Posterior chain LE Strengthening:   - calf raises in 3 positions (toes wide, normal, toes in) x 7 reps each= 21 total reps without rest- Patient rates as medium  - Modified standing ham curl (lift up  to marked yardstick each time while leaning over with upper trunk onto raised mat table) -12 reps alt LE- Patient rates as hard  - Modified donkey kick (leaning over with upper trunk onto raises mat table) x 12 reps- Patient rates as hard- Stressful on LE's  Biodex Treadmill- 3 min and another 1 min at 0.4-0.6 mph- BUE support- Patient reported UE fatigue as limiting factor.     Pt educated throughout session about proper posture and technique with exercises. Improved exercise technique, movement at target joints, use of target muscles after min to mod verbal, visual, tactile cues.   PATIENT EDUCATION: Education details: Goals, plan, exercise technique, body mechanics, energy conservation techniques  Person educated: Patient Education method: Explanation, Demonstration, Tactile cues, Verbal cues, and Handouts Education comprehension: verbalized understanding, returned demonstration, verbal cues required, and tactile cues required   HOME EXERCISE PROGRAM:  No Updates today  Access Code: [email or web address] URL: [email or web address] Date: 03/01/2022 Prepared by: Precious Bard  Exercises - Seated March  - 1 x daily - 7 x weekly - 2 sets - 10 reps - 5 hold - Seated Long Arc Quad  - 1 x daily - 7 x weekly - 2 sets - 10 reps - 5 hold - Seated Hip Abduction  - 1 x daily - 7 x weekly - 2 sets - 10 reps - 5 hold - Seated Hip Adduction Isometrics with Ball  - 1 x daily - 7 x weekly - 2 sets - 10 reps - 5 hold - Seated Gluteal Sets  - 1 x daily - 7 x weekly - 2 sets - 10 reps - 5 hold - Seated Heel Toe Raises  - 1 x daily - 7 x weekly - 2 sets - 10 reps - 5 hold LE strength and balance         PT Short Term Goals       PT SHORT TERM GOAL #1   Title Pt will be independent with initial HEP in order to improve strength and balance in order to decrease fall risk and improve function at home and work.    Baseline 02/23/2022- Patient reports not doing much as far as exercise in the home  currently. 04/20/2022- Patient reports compliant with HEP and no questions at this time.; 9/7 pt indep   Time 6    Period Weeks    Status Goal met   Target Date 04/06/22              PT Long Term Goals       PT LONG TERM GOAL #1   Title Pt will be independent with final HEP in order to improve strength and  balance in order to decrease fall risk and improve function at home and work. 04/20/2022- Patient reports compliant with HEP and no questions at this time.    Baseline 02/23/2022= No formal HEP in place. 9/7: pt performing at least once a day, feels comfortable   Time 12    Period Weeks    Status GOAL MET   Target Date 08/11/2022      PT LONG TERM GOAL #2   Title Pt will decrease 5TSTS by at least 8 seconds in order to demonstrate clinically significant improvement in LE strength.    Baseline 02/23/2022= 31.52 sec with B hands on knees; 04/20/2022= 16.08 sec with B hands on knees   Time 12    Period Days    Status Goal Met   Target Date 05/18/22      PT LONG TERM GOAL #3   Title Pt will increase by at least 0.23 m/s in order to demonstrate clinically significant improvement in community ambulation.    Baseline 02/23/2022= 0.39 m/s using RW; 0.45 m/s; 9/7: 0.37 m/s with QC, 0.74 m/s with RW   Time 12    Period Weeks    Status GOAL MET   Target Date 05/18/22      PT LONG TERM GOAL #4   Title Pt will improve FOTO to target score of 50 to display perceived improvements in ability to complete ADL's.    Baseline 02/23/2022= 47; 9/7: 49; 11/30=54%   Time 12    Period Weeks    Status GOAL MET   Target Date 08/11/2022      PT LONG TERM GOAL #5   Title Pt will decrease TUG to below 20 seconds/decrease in order to demonstrate decreased fall risk.    Baseline 02/23/2022=28.27 sec with RW; 04/20/2022= 20.22 with SBQC; 9/7: 14.4 sec with RW, 18.5 sec with SBQC   Time 12    Period Weeks    Status MET   Target Date 05/18/22    PT LONG TERM GOAL #6  Title Pt will decrease TUG to 14  sec or below with SBQC in order to demonstrate decreased fall risk.   Baseline  9/7: 14.4 sec with RW, 18.5 sec with SBQC; 11/30=16.85 sec without an AD. 09/15/2022= 22.14 sec with SPC; 11/08/2022= 25.17 sec without and AD and 21.22 sec with SPC.   Time 12   Period Weeks   Status ONGOING  Target Date 01/31/2023   PT LONG TERM GOAL #7  Title Patient will increase six minute walk test distance to >700 ft for improved gait ability and increased ease with community participation.  Baseline 9/7: completes 4 minutes and 444 ft with RW. Test discontinued at 4 minutes due to fatgiue. 08/11/2022= patient ambulated 200 feet yet required 1 seated rest break- due to BLE fatigue- using SBQC: 09/15/2022= Patient ambulated 300 feet in 4 min 20 sec with 1 seated rest break using SBQC. 11/08/2022 = 175 feet- stopped due to exhausted today- 2 min 10 sec; 01/17/2023= 200 feet in 2 min 38 sec  Time 12   Period Weeks   Status ONGOING  Target Date 01/31/2023   PT LONG TERM GOAL #8  Title Pt will decrease 5TSTS by at least 2 seconds (without UE support)  in order to demonstrate clinically significant improvement in LE strength.   Baseline 08/11/2022= 16.33 sec with B hands on knees; 09/15/2022= 22 sec with B hands on knees (patient reports increased weakness since around Christmas and just has not bounced back);  11/08/2022= 18.85 without UE Support; 01/17/2023=30.0 sec without UE support- patient reported not having a good day but tried his best.   Time 12   Period Weeks  Status ONGOING  Target Date 01/31/2023       PT LONG TERM GOAL #  Title Pt will improve BERG by at least 3 points in order to demonstrate clinically significant improvement in balance.   Baseline 11/08/2022= 33/56; 01/17/2023= 45/56  Time 12   Period Weeks  Status PROGRESSING   Target Date 01/31/2023       Plan -     Clinical Impression Statement Patient presents with good motivation for today's session. He was fatigued quickly with quads and later hips  with standing activities. He was able to complete exercises overall today with minimal rest breaks. He was challenged with increasing speed on TM- attempting to initiate longer step length. Patient will continue to benefit from skilled PT services to address deficits and impairment identified in evaluation in order to maximize independence and safety in basic mobility required for performance of ADL, IADL, and leisure.    Personal Factors and Comorbidities Age;Past/Current Experience;Time since onset of injury/illness/exacerbation;Comorbidity 3+    Comorbidities COPD, History of Cancer, myasthenia gravis, chronic lumbar surgical history    Examination-Activity Limitations Lift;Squat;Bend;Stairs;Stand;Transfers;Probation officer;Bathing;Hygiene/Grooming;Dressing;Toileting;Bed Mobility;Caring for Others;Continence    Examination-Participation Restrictions Yard Work;Church;Cleaning;Driving;Community Activity    Stability/Clinical Decision Making Evolving/Moderate complexity    Rehab Potential Good    Clinical Impairments Affecting Rehab Potential multipe comorbidities    PT Frequency 1-2x / week    PT Duration 12 weeks    PT Treatment/Interventions ADLs/Self Care Home Management;Electrical Stimulation;Moist Heat;Traction;Ultrasound;Gait training;Stair training;Functional mobility training;Therapeutic activities;Therapeutic exercise;Balance training;Neuromuscular re-education;Patient/family education;Manual techniques;Passive range of motion;Dry needling;Joint Manipulations;Cryotherapy;Vestibular;Canalith Repostioning    PT Next Visit Plan standing strengthening and balance, seated strengthening and coordination    PT Home Exercise Plan Progress LE strengthening, balance training,  and functional endurance   Consulted and Agree with Plan of Care Patient              Louis Meckel, PT Physical Therapist- [facility]  01/25/23, 8:36 AM

## 2023-01-26 ENCOUNTER — Ambulatory Visit: Payer: Medicare Other

## 2023-01-30 ENCOUNTER — Ambulatory Visit: Payer: Medicare Other

## 2023-01-31 ENCOUNTER — Ambulatory Visit: Payer: Medicare Other

## 2023-01-31 DIAGNOSIS — M6281 Muscle weakness (generalized): Secondary | ICD-10-CM

## 2023-01-31 DIAGNOSIS — R278 Other lack of coordination: Secondary | ICD-10-CM

## 2023-01-31 DIAGNOSIS — R262 Difficulty in walking, not elsewhere classified: Secondary | ICD-10-CM | POA: Diagnosis not present

## 2023-01-31 DIAGNOSIS — R296 Repeated falls: Secondary | ICD-10-CM

## 2023-01-31 DIAGNOSIS — R2681 Unsteadiness on feet: Secondary | ICD-10-CM

## 2023-01-31 DIAGNOSIS — G8929 Other chronic pain: Secondary | ICD-10-CM

## 2023-01-31 DIAGNOSIS — G959 Disease of spinal cord, unspecified: Secondary | ICD-10-CM

## 2023-01-31 DIAGNOSIS — R2689 Other abnormalities of gait and mobility: Secondary | ICD-10-CM

## 2023-01-31 NOTE — Therapy (Signed)
OUTPATIENT PHYSICAL THERAPY TREATMENT NOTE     Patient Name: Francisco [name] MRN: [id number] DOB:[birth year], 77 y.o., male Today's Date: 01/31/2023  PCP: Lynnea Ferrier, MD REFERRING PROVIDER: Venetia Night MD   PT End of Session - 01/31/23 1522     Visit Number 63    Number of Visits 74    Date for PT Re-Evaluation 01/31/23    Authorization Type [employer] Medicare    Authorization Time Period 08/11/22-11/03/2022    Progress Note Due on Visit 70    PT Start Time 1516    PT Stop Time 1600    PT Time Calculation (min) 44 min    Equipment Utilized During Treatment Gait belt    Activity Tolerance Patient tolerated treatment well;No increased pain;Patient limited by fatigue    Behavior During Therapy [facility] for tasks assessed/performed                     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   Carotid stenosis    CHF (congestive heart failure) (HCC)    CKD (chronic kidney disease)    CKD (chronic kidney disease) stage 3, GFR 30-59 ml/min (HCC)    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   DDD (degenerative disc disease), cervical    Dysrhythmia    post carotid stent bradycardia; PAF 09/2020   GERD (gastroesophageal reflux disease)    Hypercholesterolemia    Hypertension    Hypothyroidism    pt takes Levothyroxine daily   Lumbosacral spinal stenosis    Myasthenia gravis, adult form (HCC)    PAD (peripheral artery disease) (HCC)    Parkinson disease    Shortness of breath    Lung MD- Dr Joetta Manners   Sleep apnea    do not use CPAP every night   Past Surgical History:  Procedure Laterality Date   ANTERIOR CERVICAL DECOMP/DISCECTOMY FUSION  07/18/2011    Procedure: ANTERIOR CERVICAL DECOMPRESSION/DISCECTOMY FUSION 2 LEVELS;  Surgeon: Kathaleen Maser Pool;  Location: [facility];  Service: Neurosurgery;  Laterality: N/A;  cervical five-six, cervical six-seven anterior cervical discectomy and fusion   ATRIAL FIBRILLATION ABLATION     BACK SURGERY     in 1985 [facility]   BILATERAL CARPAL TUNNEL RELEASE     01/2020 Right, 04/2020 Left   CARDIAC CATHETERIZATION     2005 at [facility], no stents   CAROTID PTA/STENT INTERVENTION N/A 09/17/2020   Procedure: CAROTID PTA/STENT INTERVENTION;  Surgeon: Annice Needy, MD;  Location: [facility] INVASIVE CV LAB;  Service: Cardiovascular;  Laterality: N/A;   CATARACT EXTRACTION W/PHACO Left 01/06/2020   Procedure: CATARACT EXTRACTION PHACO AND INTRAOCULAR LENS PLACEMENT (IOC) ISTENT INJ LEFT 3.81  00:33.3;  Surgeon: Nevada Crane, MD;  Location: [facility];  Service: Ophthalmology;  Laterality: Left;   CATARACT EXTRACTION W/PHACO Right 02/03/2020   Procedure: CATARACT EXTRACTION PHACO AND INTRAOCULAR LENS PLACEMENT (IOC) RIGHT ISTENT INJ;  Surgeon: Nevada Crane, MD;  Location: [facility];  Service: Ophthalmology;  Laterality: Right;  4.29 0:35.6   COLONOSCOPY     HERNIA REPAIR Left    inguinal  hernia repair in 1985   LUMBAR LAMINECTOMY/DECOMPRESSION MICRODISCECTOMY Left 02/24/2014   Procedure: LUMBAR LAMINECTOMY/DECOMPRESSION MICRODISCECTOMY LUMBAR THREE-FOUR, FOUR-FIVE, LEFT FIVE-SACRAL ONE ;  Surgeon: Temple Pacini, MD;  Location: [facility];  Service: Neurosurgery;  Laterality: Left;  LUMBAR LAMINECTOMY/DECOMPRESSION MICRODISCECTOMY LUMBAR THREE-FOUR, FOUR-FIVE, LEFT FIVE-SACRAL ONE    LUMBAR LAMINECTOMY/DECOMPRESSION MICRODISCECTOMY N/A 05/03/2021   Procedure: Laminectomy and Foraminotomy - L2-L3;  Surgeon: Julio Sicks, MD;  Location: [facility] OR;  Service: Neurosurgery;  Laterality: N/A;  3C   POSTERIOR CERVICAL FUSION/FORAMINOTOMY N/A 08/07/2020   Procedure: C3-6 POSTERIOR FUSION WITH  DECOMPRESSION;  Surgeon: Venetia Night, MD;  Location: [facility] ORS;  Service: Neurosurgery;  Laterality: N/A;   PROSTATECTOMY  04/2013   [facility] Dr Joycelyn Das    Patient Active Problem List   Diagnosis Date Noted   SVT (supraventricular tachycardia) 12/30/2021   Hypothyroidism 12/30/2021   Parkinson's disease 12/30/2021   Elevated troponin 12/30/2021   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   Acquired thrombophilia (HCC) 01/13/2021   History of decompression of median nerve 01/01/2021   Paroxysmal atrial fibrillation (HCC) 10/06/2020   Carotid stenosis, right 09/17/2020   S/P cervical spinal fusion    Leukocytosis    Essential hypertension    Anemia of chronic disease    Postoperative pain    Neuropathic pain    Cervical myelopathy (HCC) 08/07/2020   Preop cardiovascular exam 07/17/2020   SOB (shortness of breath) on exertion 07/17/2020   Leg weakness, bilateral 07/13/2020   Aortic atherosclerosis (HCC) 07/09/2020   Body mass index (BMI) 34.0-34.9, adult 12/25/2019   Myalgia 10/30/2019   Lumbar post-laminectomy syndrome 10/24/2019   PAD (peripheral artery disease) (HCC) 06/06/2019   CKD (chronic kidney disease) stage 3, GFR 30-59 ml/min (HCC) 04/29/2019   B12 deficiency 01/23/2019   Left arm numbness 02/28/2018   Neck pain 02/28/2018   Anemia 02/02/2017   DDD (degenerative disc disease), cervical 02/02/2017   Hypothyroid 02/02/2017   MRSA (methicillin resistant staph aureus) culture positive 02/02/2017   Nocturnal hypoxia 02/02/2017   Senile purpura (HCC) 10/03/2016   Essential hypertension, benign 09/16/2016   Bilateral carotid artery disease (HCC) 09/16/2016   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/18/2016   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   History of prostate cancer 12/21/2015   Left carpal tunnel syndrome 11/11/2015   Kidney stone on left side 07/05/2015   Myasthenia gravis (HCC) 05/26/2015   Radiculitis 01/05/2015   Long-term use of  high-risk medication 10/15/2014   Persistent cough 09/10/2014   Pure hypercholesterolemia 07/25/2014   Spinal stenosis, lumbar region, with neurogenic claudication 02/24/2014   Lumbosacral stenosis with neurogenic claudication 02/24/2014   COPD (chronic obstructive pulmonary disease) (HCC) 01/22/2014    REFERRING DIAG: balance disorder   THERAPY DIAG:  Difficulty in walking, not elsewhere classified  Muscle weakness (generalized)  Unsteadiness on feet  Other abnormalities of gait and mobility  Chronic bilateral low back pain, unspecified whether sciatica present  Other lack of coordination  Repeated falls  Cervical myelopathy (HCC)  Rationale for Evaluation and Treatment Rehabilitation  PERTINENT HISTORY: Francisco [name] is a 77 year old male referred to [facility] neuro for difficulty with balance. He was also just recently dx in May 2023 with Impingement syndrome of left shoulder (M75.42) Impingement syndrome of left shoulder (primary encounter diagnosis) Left shoulder pain, unspecified chronicity Nontraumatic rupture of left proximal biceps tendon Past medical hx includes Lumbar laminectomy 04/2021. C3-6 decompression, fusion on 11/26, myasthenia gravis, 2012 ACDF 5/6, 6/7; OSA, hypothyroidism, HTN, COPD, multiple  back surgeries, 2021 carpal tunnel release.  PRECAUTIONS: Fall  SUBJECTIVE: Patient reports feeling okay today and declines any new issues.   PAIN:  Are you having pain? None reported  TODAY'S TREATMENT:   01/31/2023  THEREX:    Dynamic warm - up - sitting at edge of mat:   -march x 10 reps each LE  - LAQ x 10 reps  each LE  Supine Bridging x 10 reps  Supine Hip march x 10 reps alt LE's Supine hip abd with GTB x 10 reps each LE Sidelye hip abd (AAROM after 5 reps on left) x 10 reps x 10 Sidelye Hip clamshell x 10 reps each LE Sidelye Donkey kick  x 10 reps each LE (AAROM on Left LE)    TUG testing: see goal section for updates.   Pt educated throughout  session about proper posture and technique with exercises. Improved exercise technique, movement at target joints, use of target muscles after min to mod verbal, visual, tactile cues.   PATIENT EDUCATION: Education details: Goals, plan, exercise technique, body mechanics, energy conservation techniques  Person educated: Patient Education method: Explanation, Demonstration, Tactile cues, Verbal cues, and Handouts Education comprehension: verbalized understanding, returned demonstration, verbal cues required, and tactile cues required   HOME EXERCISE PROGRAM:  No Updates today  Access Code: [email or web address] URL: [email or web address] Date: 03/01/2022 Prepared by: Precious Bard  Exercises - Seated March  - 1 x daily - 7 x weekly - 2 sets - 10 reps - 5 hold - Seated Long Arc Quad  - 1 x daily - 7 x weekly - 2 sets - 10 reps - 5 hold - Seated Hip Abduction  - 1 x daily - 7 x weekly - 2 sets - 10 reps - 5 hold - Seated Hip Adduction Isometrics with Ball  - 1 x daily - 7 x weekly - 2 sets - 10 reps - 5 hold - Seated Gluteal Sets  - 1 x daily - 7 x weekly - 2 sets - 10 reps - 5 hold - Seated Heel Toe Raises  - 1 x daily - 7 x weekly - 2 sets - 10 reps - 5 hold LE strength and balance         PT Short Term Goals       PT SHORT TERM GOAL #1   Title Pt will be independent with initial HEP in order to improve strength and balance in order to decrease fall risk and improve function at home and work.    Baseline 02/23/2022- Patient reports not doing much as far as exercise in the home currently. 04/20/2022- Patient reports compliant with HEP and no questions at this time.; 9/7 pt indep   Time 6    Period Weeks    Status Goal met   Target Date 04/06/22              PT Long Term Goals       PT LONG TERM GOAL #1   Title Pt will be independent with final HEP in order to improve strength and balance in order to decrease fall risk and improve function at home and work. 04/20/2022-  Patient reports compliant with HEP and no questions at this time.    Baseline 02/23/2022= No formal HEP in place. 9/7: pt performing at least once a day, feels comfortable   Time 12    Period Weeks    Status GOAL MET   Target Date 08/11/2022      PT  LONG TERM GOAL #2   Title Pt will decrease 5TSTS by at least 8 seconds in order to demonstrate clinically significant improvement in LE strength.    Baseline 02/23/2022= 31.52 sec with B hands on knees; 04/20/2022= 16.08 sec with B hands on knees   Time 12    Period Days    Status Goal Met   Target Date 05/18/22      PT LONG TERM GOAL #3   Title Pt will increase by at least 0.23 m/s in order to demonstrate clinically significant improvement in community ambulation.    Baseline 02/23/2022= 0.39 m/s using RW; 0.45 m/s; 9/7: 0.37 m/s with QC, 0.74 m/s with RW   Time 12    Period Weeks    Status GOAL MET   Target Date 05/18/22      PT LONG TERM GOAL #4   Title Pt will improve FOTO to target score of 50 to display perceived improvements in ability to complete ADL's.    Baseline 02/23/2022= 47; 9/7: 49; 11/30=54%   Time 12    Period Weeks    Status GOAL MET   Target Date 08/11/2022      PT LONG TERM GOAL #5   Title Pt will decrease TUG to below 20 seconds/decrease in order to demonstrate decreased fall risk.    Baseline 02/23/2022=28.27 sec with RW; 04/20/2022= 20.22 with SBQC; 9/7: 14.4 sec with RW, 18.5 sec with SBQC   Time 12    Period Weeks    Status MET   Target Date 05/18/22    PT LONG TERM GOAL #6  Title Pt will decrease TUG to 14 sec or below with SBQC in order to demonstrate decreased fall risk.   Baseline  9/7: 14.4 sec with RW, 18.5 sec with SBQC; 11/30=16.85 sec without an AD. 09/15/2022= 22.14 sec with SPC; 11/08/2022= 25.17 sec without and AD and 21.22 sec with SPC. 01/31/2023= 22.64 sec without a AD and 24.64 sec with SBQC  Time 12   Period Weeks   Status ONGOING  Target Date 01/31/2023   PT LONG TERM GOAL #7  Title  Patient will increase six minute walk test distance to >700 ft for improved gait ability and increased ease with community participation.  Baseline 9/7: completes 4 minutes and 444 ft with RW. Test discontinued at 4 minutes due to fatgiue. 08/11/2022= patient ambulated 200 feet yet required 1 seated rest break- due to BLE fatigue- using SBQC: 09/15/2022= Patient ambulated 300 feet in 4 min 20 sec with 1 seated rest break using SBQC. 11/08/2022 = 175 feet- stopped due to exhausted today- 2 min 10 sec; 01/17/2023= 200 feet in 2 min 38 sec  Time 12   Period Weeks   Status ONGOING  Target Date 01/31/2023   PT LONG TERM GOAL #8  Title Pt will decrease 5TSTS by at least 2 seconds (without UE support)  in order to demonstrate clinically significant improvement in LE strength.   Baseline 08/11/2022= 16.33 sec with B hands on knees; 09/15/2022= 22 sec with B hands on knees (patient reports increased weakness since around Christmas and just has not bounced back); 11/08/2022= 18.85 without UE Support; 01/17/2023=30.0 sec without UE support- patient reported not having a good day but tried his best.   Time 12   Period Weeks  Status ONGOING  Target Date 01/31/2023       PT LONG TERM GOAL #  Title Pt will improve BERG by at least 3 points in order to demonstrate  clinically significant improvement in balance.   Baseline 11/08/2022= 33/56; 01/17/2023= 45/56  Time 12   Period Weeks  Status PROGRESSING   Target Date 01/31/2023       Plan -     Clinical Impression Statement Patient presented with good motivation for today's session focusing on continuing LE strengthening. He presented with some ongoing left LE muscle weakness- proximal hip flex/abd/ext weakness with most difficulty with posterior and abductor weakness. He did improve in TUG since last measure yet still slower overall vs. Remote testing. Patient will continue to benefit from skilled PT services to address deficits and impairment identified in evaluation in  order to maximize independence and safety in basic mobility required for performance of ADL, IADL, and leisure.    Personal Factors and Comorbidities Age;Past/Current Experience;Time since onset of injury/illness/exacerbation;Comorbidity 3+    Comorbidities COPD, History of Cancer, myasthenia gravis, chronic lumbar surgical history    Examination-Activity Limitations Lift;Squat;Bend;Stairs;Stand;Transfers;Probation officer;Bathing;Hygiene/Grooming;Dressing;Toileting;Bed Mobility;Caring for Others;Continence    Examination-Participation Restrictions Yard Work;Church;Cleaning;Driving;Community Activity    Stability/Clinical Decision Making Evolving/Moderate complexity    Rehab Potential Good    Clinical Impairments Affecting Rehab Potential multipe comorbidities    PT Frequency 1-2x / week    PT Duration 12 weeks    PT Treatment/Interventions ADLs/Self Care Home Management;Electrical Stimulation;Moist Heat;Traction;Ultrasound;Gait training;Stair training;Functional mobility training;Therapeutic activities;Therapeutic exercise;Balance training;Neuromuscular re-education;Patient/family education;Manual techniques;Passive range of motion;Dry needling;Joint Manipulations;Cryotherapy;Vestibular;Canalith Repostioning    PT Next Visit Plan Progress LE strengthening, balance training,  and functional endurance.   PT Home Exercise Plan standing strengthening and balance, seated strengthening and coordination.    Consulted and Agree with Plan of Care Patient              Louis Meckel, PT Physical Therapist- [facility]  01/31/23, 4:33 PM

## 2023-02-02 ENCOUNTER — Ambulatory Visit: Payer: Medicare Other

## 2023-02-02 DIAGNOSIS — M6281 Muscle weakness (generalized): Secondary | ICD-10-CM

## 2023-02-02 DIAGNOSIS — G8929 Other chronic pain: Secondary | ICD-10-CM

## 2023-02-02 DIAGNOSIS — R2689 Other abnormalities of gait and mobility: Secondary | ICD-10-CM

## 2023-02-02 DIAGNOSIS — R262 Difficulty in walking, not elsewhere classified: Secondary | ICD-10-CM

## 2023-02-02 DIAGNOSIS — R278 Other lack of coordination: Secondary | ICD-10-CM

## 2023-02-02 DIAGNOSIS — R2681 Unsteadiness on feet: Secondary | ICD-10-CM

## 2023-02-02 NOTE — Therapy (Signed)
OUTPATIENT PHYSICAL THERAPY TREATMENT NOTE/RECERT     Patient Name: Francisco [name] MRN: [id number] DOB:Feb 03, 1946, 77 y.o., male Today's Date: 02/02/2023  PCP: Lynnea Ferrier, MD REFERRING PROVIDER: Venetia Night MD   PT End of Session - 02/02/23 1523     Visit Number 64    Number of Visits 68    Date for PT Re-Evaluation 04/27/23    Authorization Type [employer] Medicare    Authorization Time Period 08/11/22-11/03/2022    Progress Note Due on Visit 70    PT Start Time 1517    PT Stop Time 1600    PT Time Calculation (min) 43 min    Equipment Utilized During Treatment Gait belt    Activity Tolerance Patient tolerated treatment well;No increased pain;Patient limited by fatigue    Behavior During Therapy [facility] for tasks assessed/performed                     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   Carotid stenosis    CHF (congestive heart failure) (HCC)    CKD (chronic kidney disease)    CKD (chronic kidney disease) stage 3, GFR 30-59 ml/min (HCC)    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   DDD (degenerative disc disease), cervical    Dysrhythmia    post carotid stent bradycardia; PAF 09/2020   GERD (gastroesophageal reflux disease)    Hypercholesterolemia    Hypertension    Hypothyroidism    pt takes Levothyroxine daily   Lumbosacral spinal stenosis    Myasthenia gravis, adult form (HCC)    PAD (peripheral artery disease) (HCC)    Parkinson disease    Shortness of breath    Lung MD- Dr Joetta Manners   Sleep apnea    do not use CPAP every night   Past Surgical History:  Procedure Laterality Date   ANTERIOR CERVICAL DECOMP/DISCECTOMY FUSION  07/18/2011    Procedure: ANTERIOR CERVICAL DECOMPRESSION/DISCECTOMY FUSION 2 LEVELS;  Surgeon: Kathaleen Maser Pool;  Location: [facility];  Service: Neurosurgery;  Laterality: N/A;  cervical five-six, cervical six-seven anterior cervical discectomy and fusion   ATRIAL FIBRILLATION ABLATION     BACK SURGERY     in 1985 [facility]   BILATERAL CARPAL TUNNEL RELEASE     01/2020 Right, 04/2020 Left   CARDIAC CATHETERIZATION     2005 at [facility], no stents   CAROTID PTA/STENT INTERVENTION N/A 09/17/2020   Procedure: CAROTID PTA/STENT INTERVENTION;  Surgeon: Annice Needy, MD;  Location: [facility] INVASIVE CV LAB;  Service: Cardiovascular;  Laterality: N/A;   CATARACT EXTRACTION W/PHACO Left 01/06/2020   Procedure: CATARACT EXTRACTION PHACO AND INTRAOCULAR LENS PLACEMENT (IOC) ISTENT INJ LEFT 3.81  00:33.3;  Surgeon: Nevada Crane, MD;  Location: [facility];  Service: Ophthalmology;  Laterality: Left;   CATARACT EXTRACTION W/PHACO Right 02/03/2020   Procedure: CATARACT EXTRACTION PHACO AND INTRAOCULAR LENS PLACEMENT (IOC) RIGHT ISTENT INJ;  Surgeon: Nevada Crane, MD;  Location: [facility];  Service: Ophthalmology;  Laterality: Right;  4.29 0:35.6   COLONOSCOPY     HERNIA REPAIR Left    inguinal  hernia repair in 1985   LUMBAR LAMINECTOMY/DECOMPRESSION MICRODISCECTOMY Left 02/24/2014   Procedure: LUMBAR LAMINECTOMY/DECOMPRESSION MICRODISCECTOMY LUMBAR THREE-FOUR, FOUR-FIVE, LEFT FIVE-SACRAL ONE ;  Surgeon: Temple Pacini, MD;  Location: [facility];  Service: Neurosurgery;  Laterality: Left;  LUMBAR LAMINECTOMY/DECOMPRESSION MICRODISCECTOMY LUMBAR THREE-FOUR, FOUR-FIVE, LEFT FIVE-SACRAL ONE    LUMBAR LAMINECTOMY/DECOMPRESSION MICRODISCECTOMY N/A 05/03/2021   Procedure: Laminectomy and Foraminotomy - L2-L3;  Surgeon: Julio Sicks, MD;  Location: [facility] OR;  Service: Neurosurgery;  Laterality: N/A;  3C   POSTERIOR CERVICAL FUSION/FORAMINOTOMY N/A 08/07/2020   Procedure: C3-6 POSTERIOR FUSION WITH  DECOMPRESSION;  Surgeon: Venetia Night, MD;  Location: [facility] ORS;  Service: Neurosurgery;  Laterality: N/A;   PROSTATECTOMY  04/2013   [facility] Dr Joycelyn Das    Patient Active Problem List   Diagnosis Date Noted   SVT (supraventricular tachycardia) 12/30/2021   Hypothyroidism 12/30/2021   Parkinson's disease 12/30/2021   Elevated troponin 12/30/2021   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   Acquired thrombophilia (HCC) 01/13/2021   History of decompression of median nerve 01/01/2021   Paroxysmal atrial fibrillation (HCC) 10/06/2020   Carotid stenosis, right 09/17/2020   S/P cervical spinal fusion    Leukocytosis    Essential hypertension    Anemia of chronic disease    Postoperative pain    Neuropathic pain    Cervical myelopathy (HCC) 08/07/2020   Preop cardiovascular exam 07/17/2020   SOB (shortness of breath) on exertion 07/17/2020   Leg weakness, bilateral 07/13/2020   Aortic atherosclerosis (HCC) 07/09/2020   Body mass index (BMI) 34.0-34.9, adult 12/25/2019   Myalgia 10/30/2019   Lumbar post-laminectomy syndrome 10/24/2019   PAD (peripheral artery disease) (HCC) 06/06/2019   CKD (chronic kidney disease) stage 3, GFR 30-59 ml/min (HCC) 04/29/2019   B12 deficiency 01/23/2019   Left arm numbness 02/28/2018   Neck pain 02/28/2018   Anemia 02/02/2017   DDD (degenerative disc disease), cervical 02/02/2017   Hypothyroid 02/02/2017   MRSA (methicillin resistant staph aureus) culture positive 02/02/2017   Nocturnal hypoxia 02/02/2017   Senile purpura (HCC) 10/03/2016   Essential hypertension, benign 09/16/2016   Bilateral carotid artery disease (HCC) 09/16/2016   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/18/2016   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   History of prostate cancer 12/21/2015   Left carpal tunnel syndrome 11/11/2015   Kidney stone on left side 07/05/2015   Myasthenia gravis (HCC) 05/26/2015   Radiculitis 01/05/2015   Long-term use of  high-risk medication 10/15/2014   Persistent cough 09/10/2014   Pure hypercholesterolemia 07/25/2014   Spinal stenosis, lumbar region, with neurogenic claudication 02/24/2014   Lumbosacral stenosis with neurogenic claudication 02/24/2014   COPD (chronic obstructive pulmonary disease) (HCC) 01/22/2014    REFERRING DIAG: balance disorder   THERAPY DIAG:  Difficulty in walking, not elsewhere classified  Muscle weakness (generalized)  Unsteadiness on feet  Other abnormalities of gait and mobility  Chronic bilateral low back pain, unspecified whether sciatica present  Other lack of coordination  Rationale for Evaluation and Treatment Rehabilitation  PERTINENT HISTORY: Jino Marks is a 77 year old male referred to OPPT neuro for difficulty with balance. He was also just recently dx in May 2023 with Impingement syndrome of left shoulder (M75.42) Impingement syndrome of left shoulder (primary encounter diagnosis) Left shoulder pain, unspecified chronicity Nontraumatic rupture of left proximal biceps tendon Past medical hx includes Lumbar laminectomy 04/2021. C3-6 decompression, fusion on 11/26, myasthenia gravis, 2012 ACDF 5/6, 6/7; OSA, hypothyroidism, HTN, COPD, multiple back surgeries, 2021 carpal tunnel release.  PRECAUTIONS: Fall  SUBJECTIVE: Patient reports feeling okay today and declines any new issues.   PAIN:  Are you having pain? None reported  TODAY'S TREATMENT:   02/02/2023  THEREX:    Dynamic warm - up - sitting at edge of mat:   -march x 10 reps each LE  - LAQ x 10 reps  each LE  Supine Bridging x 10 reps  Supine Hip march x 10 reps alt LE's Supine hip abd with GTB x 10 reps each LE Sidelye hip abd (AAROM after 5 reps on left) x 10 reps x 10 Sidelye Hip clamshell x 10 reps each LE Sidelye Donkey kick  x 10 reps each LE (AAROM on Left LE)    TUG testing: see goal section for updates.   Pt educated throughout session about proper posture and technique with  exercises. Improved exercise technique, movement at target joints, use of target muscles after min to mod verbal, visual, tactile cues.   PATIENT EDUCATION: Education details: Goals, plan, exercise technique, body mechanics, energy conservation techniques  Person educated: Patient Education method: Explanation, Demonstration, Tactile cues, Verbal cues, and Handouts Education comprehension: verbalized understanding, returned demonstration, verbal cues required, and tactile cues required   HOME EXERCISE PROGRAM:  No Updates today  Access Code: [email or web address] URL: [email or web address] Date: 03/01/2022 Prepared by: Precious Bard  Exercises - Seated March  - 1 x daily - 7 x weekly - 2 sets - 10 reps - 5 hold - Seated Long Arc Quad  - 1 x daily - 7 x weekly - 2 sets - 10 reps - 5 hold - Seated Hip Abduction  - 1 x daily - 7 x weekly - 2 sets - 10 reps - 5 hold - Seated Hip Adduction Isometrics with Ball  - 1 x daily - 7 x weekly - 2 sets - 10 reps - 5 hold - Seated Gluteal Sets  - 1 x daily - 7 x weekly - 2 sets - 10 reps - 5 hold - Seated Heel Toe Raises  - 1 x daily - 7 x weekly - 2 sets - 10 reps - 5 hold LE strength and balance         PT Short Term Goals       PT SHORT TERM GOAL #1   Title Pt will be independent with initial HEP in order to improve strength and balance in order to decrease fall risk and improve function at home and work.    Baseline 02/23/2022- Patient reports not doing much as far as exercise in the home currently. 04/20/2022- Patient reports compliant with HEP and no questions at this time.; 9/7 pt indep   Time 6    Period Weeks    Status Goal met   Target Date 04/06/22              PT Long Term Goals       PT LONG TERM GOAL #1   Title Pt will be independent with final HEP in order to improve strength and balance in order to decrease fall risk and improve function at home and work. 04/20/2022- Patient reports compliant with HEP and no questions  at this time.    Baseline 02/23/2022= No formal HEP in place. 9/7: pt performing at least once a day, feels comfortable   Time 12    Period Weeks    Status GOAL MET   Target Date 08/11/2022      PT LONG TERM GOAL #2   Title  Pt will decrease 5TSTS by at least 8 seconds in order to demonstrate clinically significant improvement in LE strength.    Baseline 02/23/2022= 31.52 sec with B hands on knees; 04/20/2022= 16.08 sec with B hands on knees   Time 12    Period Days    Status Goal Met   Target Date 05/18/22      PT LONG TERM GOAL #3   Title Pt will increase by at least 0.23 m/s in order to demonstrate clinically significant improvement in community ambulation.    Baseline 02/23/2022= 0.39 m/s using RW; 0.45 m/s; 9/7: 0.37 m/s with QC, 0.74 m/s with RW   Time 12    Period Weeks    Status GOAL MET   Target Date 05/18/22      PT LONG TERM GOAL #4   Title Pt will improve FOTO to target score of 50 to display perceived improvements in ability to complete ADL's.    Baseline 02/23/2022= 47; 9/7: 49; 11/30=54%   Time 12    Period Weeks    Status GOAL MET   Target Date 08/11/2022      PT LONG TERM GOAL #5   Title Pt will decrease TUG to below 20 seconds/decrease in order to demonstrate decreased fall risk.    Baseline 02/23/2022=28.27 sec with RW; 04/20/2022= 20.22 with SBQC; 9/7: 14.4 sec with RW, 18.5 sec with SBQC   Time 12    Period Weeks    Status MET   Target Date 05/18/22    PT LONG TERM GOAL #6  Title Pt will decrease TUG to 14 sec or below with SBQC in order to demonstrate decreased fall risk.   Baseline  9/7: 14.4 sec with RW, 18.5 sec with SBQC; 11/30=16.85 sec without an AD. 09/15/2022= 22.14 sec with SPC; 11/08/2022= 25.17 sec without and AD and 21.22 sec with SPC. 01/31/2023= 22.64 sec without a AD and 24.64 sec with SBQC  Time 12   Period Weeks   Status ONGOING  Target Date 04/27/2023   PT LONG TERM GOAL #7  Title Patient will increase six minute walk test distance to  >700 ft for improved gait ability and increased ease with community participation.  Baseline 9/7: completes 4 minutes and 444 ft with RW. Test discontinued at 4 minutes due to fatgiue. 08/11/2022= patient ambulated 200 feet yet required 1 seated rest break- due to BLE fatigue- using SBQC: 09/15/2022= Patient ambulated 300 feet in 4 min 20 sec with 1 seated rest break using SBQC. 11/08/2022 = 175 feet- stopped due to exhausted today- 2 min 10 sec; 01/17/2023= 200 feet in 2 min 38 sec; 02/02/2023= 160 feet with use of SBQC in 3 min 45 sec prior to stopping.   Time 12   Period Weeks   Status ONGOING  Target Date 04/27/2023   PT LONG TERM GOAL #8  Title Pt will decrease 5TSTS by at least 2 seconds (without UE support)  in order to demonstrate clinically significant improvement in LE strength.   Baseline 08/11/2022= 16.33 sec with B hands on knees; 09/15/2022= 22 sec with B hands on knees (patient reports increased weakness since around Christmas and just has not bounced back); 11/08/2022= 18.85 without UE Support; 01/17/2023=30.0 sec without UE support- patient reported not having a good day but tried his best. 02/02/2023- 17.74 sec with UE Support   Time 12   Period Weeks  Status ONGOING  Target Date 04/27/2023       PT LONG TERM GOAL #9  Title Pt will improve BERG by at least 3 points in order to demonstrate clinically significant improvement in balance.   Baseline 11/08/2022= 33/56; 01/17/2023= 45/56; 02/02/2023=45/56  Time 12   Period Weeks  Status PROGRESSING   Target Date 04/27/2023       Plan -     Clinical Impression Statement Patient presents for recert visit today. He was able to demonstrate some progress including improved BLE muscle strength- able to improve 5 time Sit to stand test vs. Previous test. He was also able to improve his timed up and go test overall based on last visit score yet still in range to be considered at risk for falling. He struggled most with functional endurance with decline  in overall mobility and distance as seen by 6 min walk test. Both recent BERG scores indicate improvement in overall balance since testing in Feb 2024. Patient's condition has the potential to improve in response to therapy. Maximum improvement is yet to be obtained. The anticipated improvement is attainable and reasonable in a generally predictable time. Patient will continue to benefit from skilled PT services to address deficits and impairment identified in evaluation in order to maximize independence and safety in basic mobility required for performance of ADL, IADL, and leisure.    Personal Factors and Comorbidities Age;Past/Current Experience;Time since onset of injury/illness/exacerbation;Comorbidity 3+    Comorbidities COPD, History of Cancer, myasthenia gravis, chronic lumbar surgical history    Examination-Activity Limitations Lift;Squat;Bend;Stairs;Stand;Transfers;Probation officer;Bathing;Hygiene/Grooming;Dressing;Toileting;Bed Mobility;Caring for Others;Continence    Examination-Participation Restrictions Yard Work;Church;Cleaning;Driving;Community Activity    Stability/Clinical Decision Making Evolving/Moderate complexity    Rehab Potential Good    Clinical Impairments Affecting Rehab Potential multipe comorbidities    PT Frequency 1-2x / week    PT Duration 12 weeks    PT Treatment/Interventions ADLs/Self Care Home Management;Electrical Stimulation;Moist Heat;Traction;Ultrasound;Gait training;Stair training;Functional mobility training;Therapeutic activities;Therapeutic exercise;Balance training;Neuromuscular re-education;Patient/family education;Manual techniques;Passive range of motion;Dry needling;Joint Manipulations;Cryotherapy;Vestibular;Canalith Repostioning    PT Next Visit Plan Progress LE strengthening, balance training,  and functional endurance.   PT Home Exercise Plan standing strengthening and balance, seated strengthening and coordination.    Consulted  and Agree with Plan of Care Patient              Louis Meckel, PT Physical Therapist- [facility]  02/02/23, 4:29 PM

## 2023-02-07 ENCOUNTER — Telehealth: Payer: Self-pay | Admitting: Family

## 2023-02-07 ENCOUNTER — Encounter: Payer: Medicare Other | Admitting: Family

## 2023-02-07 ENCOUNTER — Ambulatory Visit: Payer: Medicare Other

## 2023-02-07 DIAGNOSIS — R262 Difficulty in walking, not elsewhere classified: Secondary | ICD-10-CM

## 2023-02-07 DIAGNOSIS — M545 Low back pain, unspecified: Secondary | ICD-10-CM

## 2023-02-07 DIAGNOSIS — M6281 Muscle weakness (generalized): Secondary | ICD-10-CM

## 2023-02-07 DIAGNOSIS — R278 Other lack of coordination: Secondary | ICD-10-CM

## 2023-02-07 DIAGNOSIS — R296 Repeated falls: Secondary | ICD-10-CM

## 2023-02-07 DIAGNOSIS — R2681 Unsteadiness on feet: Secondary | ICD-10-CM

## 2023-02-07 DIAGNOSIS — R2689 Other abnormalities of gait and mobility: Secondary | ICD-10-CM

## 2023-02-07 NOTE — Telephone Encounter (Signed)
Patient did not show for his Heart Failure Clinic appointment on 02/07/23.

## 2023-02-07 NOTE — Progress Notes (Deleted)
PCP: Primary Cardiologist:  HPI:  Primary cardiologist: Marcina Millard, MD (last seen 12/15/22; returns 05/24) PCP: Lynnea Ferrier, MD (last seen 12/23/22)   Mr Francisco [name] is a 77 y/o male with a history of prostate cancer, carotid disease, hyperlipidemia, HTN, CKD, thyroid disease, arthritis, COPD, DDD, GERD, myasthenia gravis, PAD, sleep apnea, parkinson's, previous tobacco use and chronic heart failure.   Echo 12/30/21: EF 50-55% with mild LVH and mild MR. Echo 10/19/21: EF of 55%, moderate pulmonary HTN, mild biatrial enlargement.   Was in the ED 12/06/22 due to SVT. Converted to NSR after 1 dose of IV adenosine. CT angio negative for PE.   He presents today for an acute HF visit. He called yesterday with fluctuating HR/ BP. HR ranged from 73-168 with BP range of 91-151/61-80. He had not taken his metoprolol and was advised by cardiology to take a 1/2 tablet. He has fatigue, SOB, weakness & right sided pain along with this. Denies difficulty sleeping, abdominal distention, palpitations, pedal edema, chest pain, cough, dizziness or weight gain.  Had an ablation done 06/30/22. Was previously taking metoprolol PRN for elevated HR. Due to HTN, he was instructed to take 12.5mg  daily but he was concerned about that so he's been taking 6.25mg  every other day PRN.   He also mentions that instead of taking his trazodone, he's been taking zzzquil to help him sleep because it was recommended by a nurse friend.     ROS: All systems negative except as listed in HPI, PMH and Problem List.  SH:  Social History   Socioeconomic History   Marital status: Married    Spouse name: [name]   Number of children: Not on file   Years of education: Not on file   Highest education level: Not on file  Occupational History   Not on file  Tobacco Use   Smoking status: Former    Packs/day: 1.00    Years: 20.00    Additional pack years: 0.00    Total pack years: 20.00    Types: Cigarettes    Quit date:  09/12/2001    Years since quitting: 21.4   Smokeless tobacco: Never  Vaping Use   Vaping Use: Never used  Substance and Sexual Activity   Alcohol use: Yes    Alcohol/week: 3.0 standard drinks of alcohol    Types: 3 Glasses of wine per week    Comment: 3 glasses a wine a week   Drug use: No   Sexual activity: Yes  Other Topics Concern   Not on file  Social History Narrative   Not on file   Social Determinants of Health   Financial Resource Strain: Not on file  Food Insecurity: Not on file  Transportation Needs: Not on file  Physical Activity: Not on file  Stress: Not on file  Social Connections: Not on file  Intimate Partner Violence: Not on file    FH:  Family History  Problem Relation Age of Onset   Hypertension Mother    Stroke Mother    Stroke Father     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   Carotid stenosis  CHF (congestive heart failure) (HCC)    CKD (chronic kidney disease)    CKD (chronic kidney disease) stage 3, GFR 30-59 ml/min (HCC)    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   DDD (degenerative disc disease), cervical    Dysrhythmia    post carotid stent bradycardia; PAF 09/2020   GERD (gastroesophageal reflux disease)    Hypercholesterolemia    Hypertension    Hypothyroidism    pt takes Levothyroxine daily   Lumbosacral spinal stenosis    Myasthenia gravis, adult form (HCC)    PAD (peripheral artery disease) (HCC)    Parkinson disease    Shortness of breath    Lung MD- Dr Joetta Manners   Sleep apnea    do not use CPAP every night    Current Outpatient Medications  Medication Sig Dispense Refill   apixaban (ELIQUIS) 5 MG TABS tablet Take 5 mg by  mouth 2 (two) times daily.     atorvastatin (LIPITOR) 10 MG tablet Take 1 tablet by mouth once daily 30 tablet 0   azaTHIOprine (IMURAN) 50 MG tablet Take 150 mg by mouth daily.      carbidopa-levodopa (SINEMET IR) 25-100 MG tablet Take 1 tablet by mouth 3 (three) times daily.     Cholecalciferol (D3-1000) 25 MCG (1000 UT) capsule Take 1,000 Units by mouth daily.     clopidogrel (PLAVIX) 75 MG tablet Take 1 tablet by mouth once daily 60 tablet 0   HYDROcodone-acetaminophen (NORCO) 10-325 MG tablet Take 1-2 tablets by mouth See admin instructions. Take 1 to 2 tablets every morning, may take 1 tablet every 6 hours as needed for pain     levothyroxine (SYNTHROID) 50 MCG tablet Take 1 tablet (50 mcg total) by mouth daily at 6 (six) AM. (Patient taking differently: Take 50 mcg by mouth daily at 6 (six) AM.) 30 tablet 0   metoprolol succinate (TOPROL-XL) 25 MG 24 hr tablet Take 0.5 tablets (12.5 mg total) by mouth daily. Take with or immediately following a meal. 30 tablet 3   predniSONE (DELTASONE) 10 MG tablet Take 10 mg by mouth every other day. Taking 20 mg Monday, Wed., and Friday     spironolactone (ALDACTONE) 25 MG tablet Take 25 mg by mouth daily.     torsemide (DEMADEX) 20 MG tablet Take 20 mg by mouth daily.     No current facility-administered medications for this visit.      PHYSICAL EXAM:  General:  Well appearing. No resp difficulty HEENT: normal Neck: supple. JVP flat. Carotids 2+ bilaterally; no bruits. No lymphadenopathy or thryomegaly appreciated. Cor: PMI normal. Regular rate & rhythm. No rubs, gallops or murmurs. Lungs: clear Abdomen: soft, nontender, nondistended. No hepatosplenomegaly. No bruits or masses. Good bowel sounds. Extremities: no cyanosis, clubbing, rash, edema Neuro: alert & orientedx3, cranial nerves grossly intact. Moves all 4 extremities w/o difficulty. Affect pleasant.   ECG:   ASSESSMENT & PLAN:  1: NICM with preserved ejection fraction- - NYHA  class II - euvolemic today - weighing daily; reminded to  call for an overnight weight gain of > 2 pounds or a weekly weight gain of > 5 pounds - weight unchanged from his last visit here 1 week ago - not adding salt and has been trying to eat low sodium items; reviewed the importance of following a 2000mg  sodium diet  - getting echo updated on 01/30/23; consider SGLT2 after this - drinking ~ 50 ounces of fluid daily - continue spironolactone  25mg  daily - continue torsemide 20mg  daily  - BNP 09/27/21 was 258.2 - PharmD reconciled meds w/ patient  2: HTN with CKD- - BP 135/65; rechecked manually was 122/60 - compared his home BP cuff that he brought with our manual BP check; explained that BP's can fluctuate during the day and that his anxiety and heart rate can also affect a BP reading - saw PCP (Tumey) 12/23/22 - BMP 12/06/22 reviewed and showed sodium 142, potassium 3.6, creatinine 0.9 and GFR >60  3: COPD- - saw pulmonology Karna Christmas) 03/24 - wears oxygen at 2L at bedtime; could not tolerate CPAP  4: Myasthenia gravis/ parkinson's- - saw opthalmology Georgina Pillion) 11/22 - saw neurology (Paich) 01/24  5: PAF- - saw cardiology (Paraschos) 12/15/22; he is considering changing providers - ablation done 10/23; he is considering changing EP providers  - continue apixaban 5mg  BID - stop metoprolol tartrate and begin metoprolol succinate 12.5mg  once daily - EKG today: NSR with HR 67   Advised to not take anymore zzzquil and discuss resuming his trazodone with his PCP.   Keep already scheduled f/u appt here. Return sooner if needed.

## 2023-02-07 NOTE — Therapy (Addendum)
OUTPATIENT PHYSICAL THERAPY TREATMENT NOTE    Patient Name: Francisco [name] MRN: [id number] DOB:[birth year], 77 y.o., male Today's Date: 02/08/2023  PCP: Lynnea Ferrier, MD REFERRING PROVIDER: Venetia Night MD   PT End of Session - 02/07/23 1518     Visit Number 65    Number of Visits 88   Dated correct since most recent recert   Date for PT Re-Evaluation 04/27/23    Authorization Type [employer] Medicare    Authorization Time Period 08/11/22-11/03/2022    Progress Note Due on Visit 70    PT Start Time 1515    PT Stop Time 1559    PT Time Calculation (min) 44 min    Equipment Utilized During Treatment Gait belt    Activity Tolerance Patient tolerated treatment well;No increased pain;Patient limited by fatigue    Behavior During Therapy [facility] for tasks assessed/performed                     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   Carotid stenosis    CHF (congestive heart failure) (HCC)    CKD (chronic kidney disease)    CKD (chronic kidney disease) stage 3, GFR 30-59 ml/min (HCC)    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   DDD (degenerative disc disease), cervical    Dysrhythmia    post carotid stent bradycardia; PAF 09/2020   GERD (gastroesophageal reflux disease)    Hypercholesterolemia    Hypertension    Hypothyroidism    pt takes Levothyroxine daily   Lumbosacral spinal stenosis    Myasthenia gravis, adult form (HCC)    PAD (peripheral artery disease) (HCC)    Parkinson disease    Shortness of breath    Lung MD- Dr Joetta Manners   Sleep apnea    do not use CPAP every night   Past Surgical History:  Procedure Laterality Date   ANTERIOR CERVICAL  DECOMP/DISCECTOMY FUSION  07/18/2011   Procedure: ANTERIOR CERVICAL DECOMPRESSION/DISCECTOMY FUSION 2 LEVELS;  Surgeon: Kathaleen Maser Pool;  Location: [facility];  Service: Neurosurgery;  Laterality: N/A;  cervical five-six, cervical six-seven anterior cervical discectomy and fusion   ATRIAL FIBRILLATION ABLATION     BACK SURGERY     in 1985 [facility]   BILATERAL CARPAL TUNNEL RELEASE     01/2020 Right, 04/2020 Left   CARDIAC CATHETERIZATION     2005 at [facility], no stents   CAROTID PTA/STENT INTERVENTION N/A 09/17/2020   Procedure: CAROTID PTA/STENT INTERVENTION;  Surgeon: Annice Needy, MD;  Location: [facility] INV[facility]IVE CV [facility];  Service: Cardiovascular;  Laterality: N/A;   CATARACT EXTRACTION W/PHACO Left 01/06/2020   Procedure: CATARACT EXTRACTION PHACO AND INTRAOCULAR LENS PLACEMENT (IOC) ISTENT INJ LEFT 3.81  00:33.3;  Surgeon: Nevada Crane, MD;  Location: [facility];  Service: Ophthalmology;  Laterality: Left;   CATARACT EXTRACTION W/PHACO Right 02/03/2020   Procedure: CATARACT EXTRACTION PHACO AND INTRAOCULAR LENS PLACEMENT (IOC) RIGHT ISTENT INJ;  Surgeon: Nevada Crane, MD;  Location: [facility];  Service: Ophthalmology;  Laterality: Right;  4.29 0:35.6   COLONOSCOPY     HERNIA  REPAIR Left    inguinal hernia repair in 1985   LUMBAR LAMINECTOMY/DECOMPRESSION MICRODISCECTOMY Left 02/24/2014   Procedure: LUMBAR LAMINECTOMY/DECOMPRESSION MICRODISCECTOMY LUMBAR THREE-FOUR, FOUR-FIVE, LEFT FIVE-SACRAL ONE ;  Surgeon: Temple Pacini, MD;  Location: [facility];  Service: Neurosurgery;  Laterality: Left;  LUMBAR LAMINECTOMY/DECOMPRESSION MICRODISCECTOMY LUMBAR THREE-FOUR, FOUR-FIVE, LEFT FIVE-SACRAL ONE    LUMBAR LAMINECTOMY/DECOMPRESSION MICRODISCECTOMY N/A 05/03/2021   Procedure: Laminectomy and Foraminotomy - L2-L3;  Surgeon: Julio Sicks, MD;  Location: [facility] OR;  Service: Neurosurgery;  Laterality: N/A;  3C   POSTERIOR CERVICAL FUSION/FORAMINOTOMY N/A 08/07/2020   Procedure:  C3-6 POSTERIOR FUSION WITH DECOMPRESSION;  Surgeon: Venetia Night, MD;  Location: [facility] ORS;  Service: Neurosurgery;  Laterality: N/A;   PROSTATECTOMY  04/2013   [facility] Dr Joycelyn Das    Patient Active Problem List   Diagnosis Date Noted   SVT (supraventricular tachycardia) 12/30/2021   Hypothyroidism 12/30/2021   Parkinson's disease 12/30/2021   Elevated troponin 12/30/2021   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   Acquired thrombophilia (HCC) 01/13/2021   History of decompression of median nerve 01/01/2021   Paroxysmal atrial fibrillation (HCC) 10/06/2020   Carotid stenosis, right 09/17/2020   S/P cervical spinal fusion    Leukocytosis    Essential hypertension    Anemia of chronic disease    Postoperative pain    Neuropathic pain    Cervical myelopathy (HCC) 08/07/2020   Preop cardiovascular exam 07/17/2020   SOB (shortness of breath) on exertion 07/17/2020   Leg weakness, bilateral 07/13/2020   Aortic atherosclerosis (HCC) 07/09/2020   Body mass index (BMI) 34.0-34.9, adult 12/25/2019   Myalgia 10/30/2019   Lumbar post-laminectomy syndrome 10/24/2019   PAD (peripheral artery disease) (HCC) 06/06/2019   CKD (chronic kidney disease) stage 3, GFR 30-59 ml/min (HCC) 04/29/2019   B12 deficiency 01/23/2019   Left arm numbness 02/28/2018   Neck pain 02/28/2018   Anemia 02/02/2017   DDD (degenerative disc disease), cervical 02/02/2017   Hypothyroid 02/02/2017   MRSA (methicillin resistant staph aureus) culture positive 02/02/2017   Nocturnal hypoxia 02/02/2017   Senile purpura (HCC) 10/03/2016   Essential hypertension, benign 09/16/2016   Bilateral carotid artery disease (HCC) 09/16/2016   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/18/2016   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   History of prostate cancer 12/21/2015   Left carpal tunnel syndrome 11/11/2015   Kidney stone on left side 07/05/2015   Myasthenia gravis (HCC) 05/26/2015   Radiculitis 01/05/2015    Long-term use of high-risk medication 10/15/2014   Persistent cough 09/10/2014   Pure hypercholesterolemia 07/25/2014   Spinal stenosis, lumbar region, with neurogenic claudication 02/24/2014   Lumbosacral stenosis with neurogenic claudication 02/24/2014   COPD (chronic obstructive pulmonary disease) (HCC) 01/22/2014    REFERRING DIAG: balance disorder   THERAPY DIAG:  Difficulty in walking, not elsewhere classified  Muscle weakness (generalized)  Unsteadiness on feet  Other abnormalities of gait and mobility  Chronic bilateral low back pain, unspecified whether sciatica present  Other lack of coordination  Repeated falls  Rationale for Evaluation and Treatment Rehabilitation  PERTINENT HISTORY: Bacilio Rhyner is a 77 year old male referred to [facility] neuro for difficulty with balance. He was also just recently dx in May 2023 with Impingement syndrome of left shoulder (M75.42) Impingement syndrome of left shoulder (primary encounter diagnosis) Left shoulder pain, unspecified chronicity Nontraumatic rupture of left proximal biceps tendon Past medical hx includes Lumbar laminectomy 04/2021. C3-6 decompression, fusion on 11/26, myasthenia gravis, 2012 ACDF 5/6, 6/7; OSA, hypothyroidism, HTN,  COPD, multiple back surgeries, 2021 carpal tunnel release.  PRECAUTIONS: Fall  SUBJECTIVE: Patient  report no new changes and states feeling decent this week.    PAIN:  Are you having pain? None reported  TODAY'S TREATMENT:   02/07/2023  THEREX:    Dynamic warm - up - High knee march in // bars- down and back x 3 Dynamic mod Forward lunge down and retro lunge back x 3 Sit to stand x 10 with min to no UE support Dynamic side step up/over 1/2 foam roll x 20 reps each  Toe walk down and back x 2 trials.  Dynamic lunge walk down and back x 3 Standing calf raises x 15 reps     Pt educated throughout session about proper posture and technique with exercises. Improved exercise technique,  movement at target joints, use of target muscles after min to mod verbal, visual, tactile cues.   PATIENT EDUCATION: Education details: Goals, plan, exercise technique, body mechanics, energy conservation techniques  Person educated: Patient Education method: Explanation, Demonstration, Tactile cues, Verbal cues, and Handouts Education comprehension: verbalized understanding, returned demonstration, verbal cues required, and tactile cues required   HOME EXERCISE PROGRAM:  No Updates today  Access Code: [email or web address] URL: [email or web address] Date: 03/01/2022 Prepared by: Precious Bard  Exercises - Seated March  - 1 x daily - 7 x weekly - 2 sets - 10 reps - 5 hold - Seated Long Arc Quad  - 1 x daily - 7 x weekly - 2 sets - 10 reps - 5 hold - Seated Hip Abduction  - 1 x daily - 7 x weekly - 2 sets - 10 reps - 5 hold - Seated Hip Adduction Isometrics with Ball  - 1 x daily - 7 x weekly - 2 sets - 10 reps - 5 hold - Seated Gluteal Sets  - 1 x daily - 7 x weekly - 2 sets - 10 reps - 5 hold - Seated Heel Toe Raises  - 1 x daily - 7 x weekly - 2 sets - 10 reps - 5 hold LE strength and balance         PT Short Term Goals       PT SHORT TERM GOAL #1   Title Pt will be independent with initial HEP in order to improve strength and balance in order to decrease fall risk and improve function at home and work.    Baseline 02/23/2022- Patient reports not doing much as far as exercise in the home currently. 04/20/2022- Patient reports compliant with HEP and no questions at this time.; 9/7 pt indep   Time 6    Period Weeks    Status Goal met   Target Date 04/06/22              PT Long Term Goals       PT LONG TERM GOAL #1   Title Pt will be independent with final HEP in order to improve strength and balance in order to decrease fall risk and improve function at home and work. 04/20/2022- Patient reports compliant with HEP and no questions at this time.    Baseline 02/23/2022=  No formal HEP in place. 9/7: pt performing at least once a day, feels comfortable   Time 12    Period Weeks    Status GOAL MET   Target Date 08/11/2022      PT LONG TERM GOAL #2   Title Pt will decrease 5TSTS by at least 8 seconds in  order to demonstrate clinically significant improvement in LE strength.    Baseline 02/23/2022= 31.52 sec with B hands on knees; 04/20/2022= 16.08 sec with B hands on knees   Time 12    Period Days    Status Goal Met   Target Date 05/18/22      PT LONG TERM GOAL #3   Title Pt will increase by at least 0.23 m/s in order to demonstrate clinically significant improvement in community ambulation.    Baseline 02/23/2022= 0.39 m/s using RW; 0.45 m/s; 9/7: 0.37 m/s with QC, 0.74 m/s with RW   Time 12    Period Weeks    Status GOAL MET   Target Date 05/18/22      PT LONG TERM GOAL #4   Title Pt will improve FOTO to target score of 50 to display perceived improvements in ability to complete ADL's.    Baseline 02/23/2022= 47; 9/7: 49; 11/30=54%   Time 12    Period Weeks    Status GOAL MET   Target Date 08/11/2022      PT LONG TERM GOAL #5   Title Pt will decrease TUG to below 20 seconds/decrease in order to demonstrate decreased fall risk.    Baseline 02/23/2022=28.27 sec with RW; 04/20/2022= 20.22 with SBQC; 9/7: 14.4 sec with RW, 18.5 sec with SBQC   Time 12    Period Weeks    Status MET   Target Date 05/18/22    PT LONG TERM GOAL #6  Title Pt will decrease TUG to 14 sec or below with SBQC in order to demonstrate decreased fall risk.   Baseline  9/7: 14.4 sec with RW, 18.5 sec with SBQC; 11/30=16.85 sec without an AD. 09/15/2022= 22.14 sec with SPC; 11/08/2022= 25.17 sec without and AD and 21.22 sec with SPC. 01/31/2023= 22.64 sec without a AD and 24.64 sec with SBQC  Time 12   Period Weeks   Status ONGOING  Target Date 04/27/2023   PT LONG TERM GOAL #7  Title Patient will increase six minute walk test distance to >700 ft for improved gait ability and  increased ease with community participation.  Baseline 9/7: completes 4 minutes and 444 ft with RW. Test discontinued at 4 minutes due to fatgiue. 08/11/2022= patient ambulated 200 feet yet required 1 seated rest break- due to BLE fatigue- using SBQC: 09/15/2022= Patient ambulated 300 feet in 4 min 20 sec with 1 seated rest break using SBQC. 11/08/2022 = 175 feet- stopped due to exhausted today- 2 min 10 sec; 01/17/2023= 200 feet in 2 min 38 sec; 02/02/2023= 160 feet with use of SBQC in 3 min 45 sec prior to stopping.   Time 12   Period Weeks   Status ONGOING  Target Date 04/27/2023   PT LONG TERM GOAL #8  Title Pt will decrease 5TSTS by at least 2 seconds (without UE support)  in order to demonstrate clinically significant improvement in LE strength.   Baseline 08/11/2022= 16.33 sec with B hands on knees; 09/15/2022= 22 sec with B hands on knees (patient reports increased weakness since around Christmas and just has not bounced back); 11/08/2022= 18.85 without UE Support; 01/17/2023=30.0 sec without UE support- patient reported not having a good day but tried his best. 02/02/2023- 17.74 sec with UE Support   Time 12   Period Weeks  Status ONGOING  Target Date 04/27/2023       PT LONG TERM GOAL #9  Title Pt will improve BERG by at least 3  points in order to demonstrate clinically significant improvement in balance.   Baseline 11/08/2022= 33/56; 01/17/2023= 45/56; 02/02/2023=45/56  Time 12   Period Weeks  Status PROGRESSING   Target Date 04/27/2023       Plan -     Clinical Impression Statement Patient performed well overall- fatigue as only limiting factor with more standing activities today. He was responsive to all VC for best technique and works hard despite weakness. Patient will continue to benefit from skilled PT services to address deficits and impairment identified in evaluation in order to maximize independence and safety in basic mobility required for performance of ADL, IADL, and leisure.     Personal Factors and Comorbidities Age;Past/Current Experience;Time since onset of injury/illness/exacerbation;Comorbidity 3+    Comorbidities COPD, History of Cancer, myasthenia gravis, chronic lumbar surgical history    Examination-Activity Limitations Lift;Squat;Bend;Stairs;Stand;Transfers;Probation officer;Bathing;Hygiene/Grooming;Dressing;Toileting;Bed Mobility;Caring for Others;Continence    Examination-Participation Restrictions Yard Work;Church;Cleaning;Driving;Community Activity    Stability/Clinical Decision Making Evolving/Moderate complexity    Rehab Potential Good    Clinical Impairments Affecting Rehab Potential multipe comorbidities    PT Frequency 1-2x / week    PT Duration 12 weeks    PT Treatment/Interventions ADLs/Self Care Home Management;Electrical Stimulation;Moist Heat;Traction;Ultrasound;Gait training;Stair training;Functional mobility training;Therapeutic activities;Therapeutic exercise;Balance training;Neuromuscular re-education;Patient/family education;Manual techniques;Passive range of motion;Dry needling;Joint Manipulations;Cryotherapy;Vestibular;Canalith Repostioning    PT Next Visit Plan Progress LE strengthening, balance training,  and functional endurance.   PT Home Exercise Plan standing strengthening and balance, seated strengthening and coordination.    Consulted and Agree with Plan of Care Patient              Louis Meckel, PT Physical Therapist- [facility]  02/08/23, 12:56 PM

## 2023-02-09 ENCOUNTER — Ambulatory Visit: Payer: Medicare Other

## 2023-02-14 ENCOUNTER — Ambulatory Visit: Payer: Medicare Other | Attending: Neurosurgery

## 2023-02-14 DIAGNOSIS — R2689 Other abnormalities of gait and mobility: Secondary | ICD-10-CM | POA: Diagnosis present

## 2023-02-14 DIAGNOSIS — M5442 Lumbago with sciatica, left side: Secondary | ICD-10-CM | POA: Insufficient documentation

## 2023-02-14 DIAGNOSIS — M6281 Muscle weakness (generalized): Secondary | ICD-10-CM | POA: Diagnosis present

## 2023-02-14 DIAGNOSIS — R296 Repeated falls: Secondary | ICD-10-CM | POA: Insufficient documentation

## 2023-02-14 DIAGNOSIS — R262 Difficulty in walking, not elsewhere classified: Secondary | ICD-10-CM | POA: Insufficient documentation

## 2023-02-14 DIAGNOSIS — R278 Other lack of coordination: Secondary | ICD-10-CM | POA: Diagnosis present

## 2023-02-14 DIAGNOSIS — R269 Unspecified abnormalities of gait and mobility: Secondary | ICD-10-CM | POA: Insufficient documentation

## 2023-02-14 DIAGNOSIS — G8929 Other chronic pain: Secondary | ICD-10-CM | POA: Diagnosis present

## 2023-02-14 DIAGNOSIS — G959 Disease of spinal cord, unspecified: Secondary | ICD-10-CM | POA: Insufficient documentation

## 2023-02-14 DIAGNOSIS — M545 Low back pain, unspecified: Secondary | ICD-10-CM | POA: Diagnosis present

## 2023-02-14 DIAGNOSIS — R2681 Unsteadiness on feet: Secondary | ICD-10-CM | POA: Diagnosis present

## 2023-02-14 NOTE — Therapy (Signed)
OUTPATIENT PHYSICAL THERAPY TREATMENT NOTE    Patient Name: Francisco [name] MRN: [id number] DOB:[birth year], 77 y.o., male Today's Date: 02/15/2023  PCP: Lynnea Ferrier, MD REFERRING PROVIDER: Venetia Night MD   PT End of Session - 02/14/23 1519     Visit Number 66    Number of Visits 88   Dated correct since most recent recert   Date for PT Re-Evaluation 04/27/23    Authorization Type [employer] Medicare    Authorization Time Period 08/11/22-11/03/2022    Progress Note Due on Visit 70    PT Start Time 1517    PT Stop Time 1555    PT Time Calculation (min) 38 min    Equipment Utilized During Treatment Gait belt    Activity Tolerance Patient tolerated treatment well;No increased pain;Patient limited by fatigue    Behavior During Therapy [facility] for tasks assessed/performed                     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   Carotid stenosis    CHF (congestive heart failure) (HCC)    CKD (chronic kidney disease)    CKD (chronic kidney disease) stage 3, GFR 30-59 ml/min (HCC)    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   DDD (degenerative disc disease), cervical    Dysrhythmia    post carotid stent bradycardia; PAF 09/2020   GERD (gastroesophageal reflux disease)    Hypercholesterolemia    Hypertension    Hypothyroidism    pt takes Levothyroxine daily   Lumbosacral spinal stenosis    Myasthenia gravis, adult form (HCC)    PAD (peripheral artery disease) (HCC)    Parkinson disease    Shortness of breath    Lung MD- Dr Joetta Manners   Sleep apnea    do not use CPAP every night   Past Surgical History:  Procedure Laterality Date   ANTERIOR CERVICAL  DECOMP/DISCECTOMY FUSION  07/18/2011   Procedure: ANTERIOR CERVICAL DECOMPRESSION/DISCECTOMY FUSION 2 LEVELS;  Surgeon: Kathaleen Maser Pool;  Location: [facility];  Service: Neurosurgery;  Laterality: N/A;  cervical five-six, cervical six-seven anterior cervical discectomy and fusion   ATRIAL FIBRILLATION ABLATION     BACK SURGERY     in 1985 [facility]   BILATERAL CARPAL TUNNEL RELEASE     01/2020 Right, 04/2020 Left   CARDIAC CATHETERIZATION     2005 at [facility], no stents   CAROTID PTA/STENT INTERVENTION N/A 09/17/2020   Procedure: CAROTID PTA/STENT INTERVENTION;  Surgeon: Annice Needy, MD;  Location: [facility] INVASIVE CV [facility];  Service: Cardiovascular;  Laterality: N/A;   CATARACT EXTRACTION W/PHACO Left 01/06/2020   Procedure: CATARACT EXTRACTION PHACO AND INTRAOCULAR LENS PLACEMENT (IOC) ISTENT INJ LEFT 3.81  00:33.3;  Surgeon: Nevada Crane, MD;  Location: [facility];  Service: Ophthalmology;  Laterality: Left;   CATARACT EXTRACTION W/PHACO Right 02/03/2020   Procedure: CATARACT EXTRACTION PHACO AND INTRAOCULAR LENS PLACEMENT (IOC) RIGHT ISTENT INJ;  Surgeon: Nevada Crane, MD;  Location: [facility];  Service: Ophthalmology;  Laterality: Right;  4.29 0:35.6   COLONOSCOPY     HERNIA  REPAIR Left    inguinal hernia repair in 1985   LUMBAR LAMINECTOMY/DECOMPRESSION MICRODISCECTOMY Left 02/24/2014   Procedure: LUMBAR LAMINECTOMY/DECOMPRESSION MICRODISCECTOMY LUMBAR THREE-FOUR, FOUR-FIVE, LEFT FIVE-SACRAL ONE ;  Surgeon: Temple Pacini, MD;  Location: [facility];  Service: Neurosurgery;  Laterality: Left;  LUMBAR LAMINECTOMY/DECOMPRESSION MICRODISCECTOMY LUMBAR THREE-FOUR, FOUR-FIVE, LEFT FIVE-SACRAL ONE    LUMBAR LAMINECTOMY/DECOMPRESSION MICRODISCECTOMY N/A 05/03/2021   Procedure: Laminectomy and Foraminotomy - L2-L3;  Surgeon: Julio Sicks, MD;  Location: [facility];  Service: Neurosurgery;  Laterality: N/A;  3C   POSTERIOR CERVICAL FUSION/FORAMINOTOMY N/A 08/07/2020   Procedure:  C3-6 POSTERIOR FUSION WITH DECOMPRESSION;  Surgeon: Venetia Night, MD;  Location: [facility] OR[facility];  Service: Neurosurgery;  Laterality: N/A;   PROSTATECTOMY  04/2013   [facility] Dr Joycelyn Das    Patient Active Problem List   Diagnosis Date Noted   SVT (supraventricular tachycardia) 12/30/2021   Hypothyroidism 12/30/2021   Parkinson's disease 12/30/2021   Elevated troponin 12/30/2021   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   Acquired thrombophilia (HCC) 01/13/2021   History of decompression of median nerve 01/01/2021   Paroxysmal atrial fibrillation (HCC) 10/06/2020   Carotid stenosis, right 09/17/2020   S/P cervical spinal fusion    Leukocytosis    Essential hypertension    Anemia of chronic disease    Postoperative pain    Neuropathic pain    Cervical myelopathy (HCC) 08/07/2020   Preop cardiovascular exam 07/17/2020   SOB (shortness of breath) on exertion 07/17/2020   Leg weakness, bilateral 07/13/2020   Aortic atherosclerosis (HCC) 07/09/2020   Body mass index (BMI) 34.0-34.9, adult 12/25/2019   Myalgia 10/30/2019   Lumbar post-laminectomy syndrome 10/24/2019   PAD (peripheral artery disease) (HCC) 06/06/2019   CKD (chronic kidney disease) stage 3, GFR 30-59 ml/min (HCC) 04/29/2019   B12 deficiency 01/23/2019   Left arm numbness 02/28/2018   Neck pain 02/28/2018   Anemia 02/02/2017   DDD (degenerative disc disease), cervical 02/02/2017   Hypothyroid 02/02/2017   MRSA (methicillin resistant staph aureus) culture positive 02/02/2017   Nocturnal hypoxia 02/02/2017   Senile purpura (HCC) 10/03/2016   Essential hypertension, benign 09/16/2016   Bilateral carotid artery disease (HCC) 09/16/2016   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/18/2016   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   History of prostate cancer 12/21/2015   Left carpal tunnel syndrome 11/11/2015   Kidney stone on left side 07/05/2015   Myasthenia gravis (HCC) 05/26/2015   Radiculitis 01/05/2015    Long-term use of high-risk medication 10/15/2014   Persistent cough 09/10/2014   Pure hypercholesterolemia 07/25/2014   Spinal stenosis, lumbar region, with neurogenic claudication 02/24/2014   Lumbosacral stenosis with neurogenic claudication 02/24/2014   COPD (chronic obstructive pulmonary disease) (HCC) 01/22/2014    REFERRING DIAG: balance disorder   THERAPY DIAG:  Difficulty in walking, not elsewhere classified  Muscle weakness (generalized)  Unsteadiness on feet  Other abnormalities of gait and mobility  Chronic bilateral low back pain, unspecified whether sciatica present  Other lack of coordination  Repeated falls  Cervical myelopathy (HCC)  Abnormality of gait and mobility  Rationale for Evaluation and Treatment Rehabilitation  PERTINENT HISTORY: Jareem Preble is a 77 year old male referred to [facility] neuro for difficulty with balance. He was also just recently dx in May 2023 with Impingement syndrome of left shoulder (M75.42) Impingement syndrome of left shoulder (primary encounter diagnosis) Left shoulder pain, unspecified chronicity Nontraumatic rupture of left proximal biceps tendon Past medical hx includes Lumbar laminectomy 04/2021. C3-6 decompression, fusion on  11/26, myasthenia gravis, 2012 ACDF 5/6, 6/7; OSA, hypothyroidism, HTN, COPD, multiple back surgeries, 2021 carpal tunnel release.  PRECAUTIONS: Fall  SUBJECTIVE: Patient reports feeling rough - Having some new right LE pain- none currently today.      PAIN:  Are you having pain? None reported  TODAY'S TREATMENT:   02/14/2023  THEREX:   Circ[address] 1:  Gait - walking short distance without UE support x 60 feet - 02 sat =95;HR=77 bpm.  Seated scap row with GTB x 12 reps Sit to stand without UE Support x 12 reps  Circ[address] 2: Gait - walking short distance with SBQC x 60 feet - 02 sat =92;HR=74 bpm.  Standing lumbar flex into Ext x 12 reps Standing calf raises x 12 reps each LE  Circ[address]  3:  Gait - walking short distance without UE support x 60 feet - 02 sat =91;HR=83 bpm. - Standing hip circles (LE around blue foam roll) x 10 reps CW/CCW -Seated LAQ x 15 reps each LE  [address] 4:  - Gait - walking short distance without UE support x 75 feet     Pt educated throughout session about proper posture and technique with exercises. Improved exercise technique, movement at target joints, use of target muscles after min to mod verbal, visual, tactile cues.   PATIENT EDUCATION: Education details: Goals, plan, exercise technique, body mechanics, energy conservation techniques  Person educated: Patient Education method: Explanation, Demonstration, Tactile cues, Verbal cues, and Handouts Education comprehension: verbalized understanding, returned demonstration, verbal cues required, and tactile cues required   HOME EXERCISE PROGRAM:  No Updates today  Access Code: [email or web address] URL: [email or web address] Date: 03/01/2022 Prepared by: Precious Bard  Exercises - Seated March  - 1 x daily - 7 x weekly - 2 sets - 10 reps - 5 hold - Seated Long Arc Quad  - 1 x daily - 7 x weekly - 2 sets - 10 reps - 5 hold - Seated Hip Abduction  - 1 x daily - 7 x weekly - 2 sets - 10 reps - 5 hold - Seated Hip Adduction Isometrics with Ball  - 1 x daily - 7 x weekly - 2 sets - 10 reps - 5 hold - Seated Gluteal Sets  - 1 x daily - 7 x weekly - 2 sets - 10 reps - 5 hold - Seated Heel Toe Raises  - 1 x daily - 7 x weekly - 2 sets - 10 reps - 5 hold LE strength and balance         PT Short Term Goals       PT SHORT TERM GOAL #1   Title Pt will be independent with initial HEP in order to improve strength and balance in order to decrease fall risk and improve function at home and work.    Baseline 02/23/2022- Patient reports not doing much as far as exercise in the home currently. 04/20/2022- Patient reports compliant with HEP and no questions at this time.; 9/7 pt indep   Time 6     Period Weeks    Status Goal met   Target Date 04/06/22              PT Long Term Goals       PT LONG TERM GOAL #1   Title Pt will be independent with final HEP in order to improve strength and balance in order to decrease fall risk and improve function at home and work. 04/20/2022- Patient reports  compliant with HEP and no questions at this time.    Baseline 02/23/2022= No formal HEP in place. 9/7: pt performing at least once a day, feels comfortable   Time 12    Period Weeks    Status GOAL MET   Target Date 08/11/2022      PT LONG TERM GOAL #2   Title Pt will decrease 5TSTS by at least 8 seconds in order to demonstrate clinically significant improvement in LE strength.    Baseline 02/23/2022= 31.52 sec with B hands on knees; 04/20/2022= 16.08 sec with B hands on knees   Time 12    Period Days    Status Goal Met   Target Date 05/18/22      PT LONG TERM GOAL #3   Title Pt will increase by at least 0.23 m/s in order to demonstrate clinically significant improvement in community ambulation.    Baseline 02/23/2022= 0.39 m/s using RW; 0.45 m/s; 9/7: 0.37 m/s with QC, 0.74 m/s with RW   Time 12    Period Weeks    Status GOAL MET   Target Date 05/18/22      PT LONG TERM GOAL #4   Title Pt will improve FOTO to target score of 50 to display perceived improvements in ability to complete ADL's.    Baseline 02/23/2022= 47; 9/7: 49; 11/30=54%   Time 12    Period Weeks    Status GOAL MET   Target Date 08/11/2022      PT LONG TERM GOAL #5   Title Pt will decrease TUG to below 20 seconds/decrease in order to demonstrate decreased fall risk.    Baseline 02/23/2022=28.27 sec with RW; 04/20/2022= 20.22 with SBQC; 9/7: 14.4 sec with RW, 18.5 sec with SBQC   Time 12    Period Weeks    Status MET   Target Date 05/18/22    PT LONG TERM GOAL #6  Title Pt will decrease TUG to 14 sec or below with SBQC in order to demonstrate decreased fall risk.   Baseline  9/7: 14.4 sec with RW, 18.5 sec  with SBQC; 11/30=16.85 sec without an AD. 09/15/2022= 22.14 sec with SPC; 11/08/2022= 25.17 sec without and AD and 21.22 sec with SPC. 01/31/2023= 22.64 sec without a AD and 24.64 sec with SBQC  Time 12   Period Weeks   Status ONGOING  Target Date 04/27/2023   PT LONG TERM GOAL #7  Title Patient will increase six minute walk test distance to >700 ft for improved gait ability and increased ease with community participation.  Baseline 9/7: completes 4 minutes and 444 ft with RW. Test discontinued at 4 minutes due to fatgiue. 08/11/2022= patient ambulated 200 feet yet required 1 seated rest break- due to BLE fatigue- using SBQC: 09/15/2022= Patient ambulated 300 feet in 4 min 20 sec with 1 seated rest break using SBQC. 11/08/2022 = 175 feet- stopped due to exhausted today- 2 min 10 sec; 01/17/2023= 200 feet in 2 min 38 sec; 02/02/2023= 160 feet with use of SBQC in 3 min 45 sec prior to stopping.   Time 12   Period Weeks   Status ONGOING  Target Date 04/27/2023   PT LONG TERM GOAL #8  Title Pt will decrease 5TSTS by at least 2 seconds (without UE support)  in order to demonstrate clinically significant improvement in LE strength.   Baseline 08/11/2022= 16.33 sec with B hands on knees; 09/15/2022= 22 sec with B hands on knees (patient reports increased weakness  since around Christmas and just has not bounced back); 11/08/2022= 18.85 without UE Support; 01/17/2023=30.0 sec without UE support- patient reported not having a good day but tried his best. 02/02/2023- 17.74 sec with UE Support   Time 12   Period Weeks  Status ONGOING  Target Date 04/27/2023       PT LONG TERM GOAL #9  Title Pt will improve BERG by at least 3 points in order to demonstrate clinically significant improvement in balance.   Baseline 11/08/2022= 33/56; 01/17/2023= 45/56; 02/02/2023=45/56  Time 12   Period Weeks  Status PROGRESSING   Target Date 04/27/2023       Plan -     Clinical Impression Statement Patient able to demonstrate improved  overall mobility including some walking today without an assistive device for very short distance. His left LE did not fatigue as quickly today and he was able to complete 3 rounds of circuit style workout with minimal rest breaks today. Patient will continue to benefit from skilled PT services to address deficits and impairment identified in evaluation in order to maximize independence and safety in basic mobility required for performance of ADL, IADL, and leisure.    Personal Factors and Comorbidities Age;Past/Current Experience;Time since onset of injury/illness/exacerbation;Comorbidity 3+    Comorbidities COPD, History of Cancer, myasthenia gravis, chronic lumbar surgical history    Examination-Activity Limitations Lift;Squat;Bend;Stairs;Stand;Transfers;Probation officer;Bathing;Hygiene/Grooming;Dressing;Toileting;Bed Mobility;Caring for Others;Continence    Examination-Participation Restrictions Yard Work;Church;Cleaning;Driving;Community Activity    Stability/Clinical Decision Making Evolving/Moderate complexity    Rehab Potential Good    Clinical Impairments Affecting Rehab Potential multipe comorbidities    PT Frequency 1-2x / week    PT Duration 12 weeks    PT Treatment/Interventions ADLs/Self Care Home Management;Electrical Stimulation;Moist Heat;Traction;Ultrasound;Gait training;Stair training;Functional mobility training;Therapeutic activities;Therapeutic exercise;Balance training;Neuromuscular re-education;Patient/family education;Manual techniques;Passive range of motion;Dry needling;Joint Manipulations;Cryotherapy;Vestibular;Canalith Repostioning    PT Next Visit Plan Progress LE strengthening, balance training,  and functional endurance.   PT Home Exercise Plan standing strengthening and balance, seated strengthening and coordination.    Consulted and Agree with Plan of Care Patient              Louis Meckel, PT Physical Therapist- [facility]  02/15/23, 3:55 PM

## 2023-02-16 ENCOUNTER — Ambulatory Visit: Payer: Medicare Other

## 2023-02-16 DIAGNOSIS — R262 Difficulty in walking, not elsewhere classified: Secondary | ICD-10-CM | POA: Diagnosis not present

## 2023-02-16 DIAGNOSIS — R278 Other lack of coordination: Secondary | ICD-10-CM

## 2023-02-16 DIAGNOSIS — R2681 Unsteadiness on feet: Secondary | ICD-10-CM

## 2023-02-16 DIAGNOSIS — G8929 Other chronic pain: Secondary | ICD-10-CM

## 2023-02-16 DIAGNOSIS — M545 Low back pain, unspecified: Secondary | ICD-10-CM

## 2023-02-16 DIAGNOSIS — R296 Repeated falls: Secondary | ICD-10-CM

## 2023-02-16 DIAGNOSIS — R2689 Other abnormalities of gait and mobility: Secondary | ICD-10-CM

## 2023-02-16 DIAGNOSIS — M6281 Muscle weakness (generalized): Secondary | ICD-10-CM

## 2023-02-16 DIAGNOSIS — G959 Disease of spinal cord, unspecified: Secondary | ICD-10-CM

## 2023-02-16 DIAGNOSIS — R269 Unspecified abnormalities of gait and mobility: Secondary | ICD-10-CM

## 2023-02-16 NOTE — Therapy (Signed)
OUTPATIENT PHYSICAL THERAPY TREATMENT NOTE    Patient Name: LILBERN SLACUM MRN: [id number] DOB:[birth year], 77 y.o., male Today's Date: 02/16/2023  PCP: Lynnea Ferrier, MD REFERRING PROVIDER: Venetia Night MD   PT End of Session - 02/16/23 1524     Visit Number 67    Number of Visits 88   Dated correct since most recent recert   Date for PT Re-Evaluation 04/27/23    Authorization Type [employer] Medicare    Authorization Time Period 08/11/22-11/03/2022    Progress Note Due on Visit 70    PT Start Time 1515    PT Stop Time 1555    PT Time Calculation (min) 40 min    Equipment Utilized During Treatment Gait belt    Activity Tolerance Patient tolerated treatment well;No increased pain;Patient limited by fatigue    Behavior During Therapy [facility] for tasks assessed/performed                     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   Carotid stenosis    CHF (congestive heart failure) (HCC)    CKD (chronic kidney disease)    CKD (chronic kidney disease) stage 3, GFR 30-59 ml/min (HCC)    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   DDD (degenerative disc disease), cervical    Dysrhythmia    post carotid stent bradycardia; PAF 09/2020   GERD (gastroesophageal reflux disease)    Hypercholesterolemia    Hypertension    Hypothyroidism    pt takes Levothyroxine daily   Lumbosacral spinal stenosis    Myasthenia gravis, adult form (HCC)    PAD (peripheral artery disease) (HCC)    Parkinson disease    Shortness of breath    Lung MD- Dr Joetta Manners   Sleep apnea    do not use CPAP every night   Past Surgical History:  Procedure Laterality Date   ANTERIOR CERVICAL  DECOMP/DISCECTOMY FUSION  07/18/2011   Procedure: ANTERIOR CERVICAL DECOMPRESSION/DISCECTOMY FUSION 2 LEVELS;  Surgeon: Kathaleen Maser Pool;  Location: [facility];  Service: Neurosurgery;  Laterality: N/A;  cervical five-six, cervical six-seven anterior cervical discectomy and fusion   ATRIAL FIBRILLATION ABLATION     BACK SURGERY     in 1985 [facility]   BILATERAL CARPAL TUNNEL RELEASE     01/2020 Right, 04/2020 Left   CARDIAC CATHETERIZATION     2005 at [facility], no stents   CAROTID PTA/STENT INTERVENTION N/A 09/17/2020   Procedure: CAROTID PTA/STENT INTERVENTION;  Surgeon: Annice Needy, MD;  Location: [facility] INVASIVE CV [facility];  Service: Cardiovascular;  Laterality: N/A;   CATARACT EXTRACTION W/PHACO Left 01/06/2020   Procedure: CATARACT EXTRACTION PHACO AND INTRAOCULAR LENS PLACEMENT (IOC) ISTENT INJ LEFT 3.81  00:33.3;  Surgeon: Nevada Crane, MD;  Location: [facility];  Service: Ophthalmology;  Laterality: Left;   CATARACT EXTRACTION W/PHACO Right 02/03/2020   Procedure: CATARACT EXTRACTION PHACO AND INTRAOCULAR LENS PLACEMENT (IOC) RIGHT ISTENT INJ;  Surgeon: Nevada Crane, MD;  Location: [facility];  Service: Ophthalmology;  Laterality: Right;  4.29 0:35.6   COLONOSCOPY     HERNIA  REPAIR Left    inguinal hernia repair in 1985   LUMBAR LAMINECTOMY/DECOMPRESSION MICRODISCECTOMY Left 02/24/2014   Procedure: LUMBAR LAMINECTOMY/DECOMPRESSION MICRODISCECTOMY LUMBAR THREE-FOUR, FOUR-FIVE, LEFT FIVE-SACRAL ONE ;  Surgeon: Temple Pacini, MD;  Location: [facility];  Service: Neurosurgery;  Laterality: Left;  LUMBAR LAMINECTOMY/DECOMPRESSION MICRODISCECTOMY LUMBAR THREE-FOUR, FOUR-FIVE, LEFT FIVE-SACRAL ONE    LUMBAR LAMINECTOMY/DECOMPRESSION MICRODISCECTOMY N/A 05/03/2021   Procedure: Laminectomy and Foraminotomy - L2-L3;  Surgeon: Julio Sicks, MD;  Location: [facility];  Service: Neurosurgery;  Laterality: N/A;  3C   POSTERIOR CERVICAL FUSION/FORAMINOTOMY N/A 08/07/2020   Procedure:  C3-6 POSTERIOR FUSION WITH DECOMPRESSION;  Surgeon: Venetia Night, MD;  Location: [facility] ORS;  Service: Neurosurgery;  Laterality: N/A;   PROSTATECTOMY  04/2013   [facility] Dr Joycelyn Das    Patient Active Problem List   Diagnosis Date Noted   SVT (supraventricular tachycardia) 12/30/2021   Hypothyroidism 12/30/2021   Parkinson's disease 12/30/2021   Elevated troponin 12/30/2021   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   Acquired thrombophilia (HCC) 01/13/2021   History of decompression of median nerve 01/01/2021   Paroxysmal atrial fibrillation (HCC) 10/06/2020   Carotid stenosis, right 09/17/2020   S/P cervical spinal fusion    Leukocytosis    Essential hypertension    Anemia of chronic disease    Postoperative pain    Neuropathic pain    Cervical myelopathy (HCC) 08/07/2020   Preop cardiovascular exam 07/17/2020   SOB (shortness of breath) on exertion 07/17/2020   Leg weakness, bilateral 07/13/2020   Aortic atherosclerosis (HCC) 07/09/2020   Body mass index (BMI) 34.0-34.9, adult 12/25/2019   Myalgia 10/30/2019   Lumbar post-laminectomy syndrome 10/24/2019   PAD (peripheral artery disease) (HCC) 06/06/2019   CKD (chronic kidney disease) stage 3, GFR 30-59 ml/min (HCC) 04/29/2019   B12 deficiency 01/23/2019   Left arm numbness 02/28/2018   Neck pain 02/28/2018   Anemia 02/02/2017   DDD (degenerative disc disease), cervical 02/02/2017   Hypothyroid 02/02/2017   MRSA (methicillin resistant staph aureus) culture positive 02/02/2017   Nocturnal hypoxia 02/02/2017   Senile purpura (HCC) 10/03/2016   Essential hypertension, benign 09/16/2016   Bilateral carotid artery disease (HCC) 09/16/2016   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/18/2016   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   History of prostate cancer 12/21/2015   Left carpal tunnel syndrome 11/11/2015   Kidney stone on left side 07/05/2015   Myasthenia gravis (HCC) 05/26/2015   Radiculitis 01/05/2015    Long-term use of high-risk medication 10/15/2014   Persistent cough 09/10/2014   Pure hypercholesterolemia 07/25/2014   Spinal stenosis, lumbar region, with neurogenic claudication 02/24/2014   Lumbosacral stenosis with neurogenic claudication 02/24/2014   COPD (chronic obstructive pulmonary disease) (HCC) 01/22/2014    REFERRING DIAG: balance disorder   THERAPY DIAG:  Difficulty in walking, not elsewhere classified  Muscle weakness (generalized)  Unsteadiness on feet  Other abnormalities of gait and mobility  Chronic bilateral low back pain, unspecified whether sciatica present  Other lack of coordination  Repeated falls  Cervical myelopathy (HCC)  Abnormality of gait and mobility  Rationale for Evaluation and Treatment Rehabilitation  PERTINENT HISTORY: Zylen Hoganson is a 77 year old male referred to [facility] neuro for difficulty with balance. He was also just recently dx in May 2023 with Impingement syndrome of left shoulder (M75.42) Impingement syndrome of left shoulder (primary encounter diagnosis) Left shoulder pain, unspecified chronicity Nontraumatic rupture of left proximal biceps tendon Past medical hx includes Lumbar laminectomy 04/2021. C3-6 decompression, fusion on  11/26, myasthenia gravis, 2012 ACDF 5/6, 6/7; OSA, hypothyroidism, HTN, COPD, multiple back surgeries, 2021 carpal tunnel release.  PRECAUTIONS: Fall  SUBJECTIVE: Patient reports feeling tired overall today states didn't sleep well last night    PAIN:  Are you having pain? None reported  TODAY'S TREATMENT:   02/16/2023  THEREX:   Nustep - LE only x 5 min - attempting to maintain > 50 SPM at L1- Patient reported very fatigued   Seated Ham curl using cable system at 2.5# - 3 sets of 15 reps on Right and 3 sets of 15 reps on Left with considerable more effort.  Seated Hip abd using cable system at 2.5#- 2 sets of 12 reps on Right and 1 sets of 15 reps on left TRX squats  2 sets of 10  reps       Pt educated throughout session about proper posture and technique with exercises. Improved exercise technique, movement at target joints, use of target muscles after min to mod verbal, visual, tactile cues.   PATIENT EDUCATION: Education details: Goals, plan, exercise technique, body mechanics, energy conservation techniques  Person educated: Patient Education method: Explanation, Demonstration, Tactile cues, Verbal cues, and Handouts Education comprehension: verbalized understanding, returned demonstration, verbal cues required, and tactile cues required   HOME EXERCISE PROGRAM:  No Updates today  Access Code: [email or web address] URL: [email or web address] Date: 03/01/2022 Prepared by: Precious Bard  Exercises - Seated March  - 1 x daily - 7 x weekly - 2 sets - 10 reps - 5 hold - Seated Long Arc Quad  - 1 x daily - 7 x weekly - 2 sets - 10 reps - 5 hold - Seated Hip Abduction  - 1 x daily - 7 x weekly - 2 sets - 10 reps - 5 hold - Seated Hip Adduction Isometrics with Ball  - 1 x daily - 7 x weekly - 2 sets - 10 reps - 5 hold - Seated Gluteal Sets  - 1 x daily - 7 x weekly - 2 sets - 10 reps - 5 hold - Seated Heel Toe Raises  - 1 x daily - 7 x weekly - 2 sets - 10 reps - 5 hold LE strength and balance         PT Short Term Goals       PT SHORT TERM GOAL #1   Title Pt will be independent with initial HEP in order to improve strength and balance in order to decrease fall risk and improve function at home and work.    Baseline 02/23/2022- Patient reports not doing much as far as exercise in the home currently. 04/20/2022- Patient reports compliant with HEP and no questions at this time.; 9/7 pt indep   Time 6    Period Weeks    Status Goal met   Target Date 04/06/22              PT Long Term Goals       PT LONG TERM GOAL #1   Title Pt will be independent with final HEP in order to improve strength and balance in order to decrease fall risk and improve  function at home and work. 04/20/2022- Patient reports compliant with HEP and no questions at this time.    Baseline 02/23/2022= No formal HEP in place. 9/7: pt performing at least once a day, feels comfortable   Time 12    Period Weeks    Status GOAL MET   Target Date 08/11/2022  PT LONG TERM GOAL #2   Title Pt will decrease 5TSTS by at least 8 seconds in order to demonstrate clinically significant improvement in LE strength.    Baseline 02/23/2022= 31.52 sec with B hands on knees; 04/20/2022= 16.08 sec with B hands on knees   Time 12    Period Days    Status Goal Met   Target Date 05/18/22      PT LONG TERM GOAL #3   Title Pt will increase by at least 0.23 m/s in order to demonstrate clinically significant improvement in community ambulation.    Baseline 02/23/2022= 0.39 m/s using RW; 0.45 m/s; 9/7: 0.37 m/s with QC, 0.74 m/s with RW   Time 12    Period Weeks    Status GOAL MET   Target Date 05/18/22      PT LONG TERM GOAL #4   Title Pt will improve FOTO to target score of 50 to display perceived improvements in ability to complete ADL's.    Baseline 02/23/2022= 47; 9/7: 49; 11/30=54%   Time 12    Period Weeks    Status GOAL MET   Target Date 08/11/2022      PT LONG TERM GOAL #5   Title Pt will decrease TUG to below 20 seconds/decrease in order to demonstrate decreased fall risk.    Baseline 02/23/2022=28.27 sec with RW; 04/20/2022= 20.22 with SBQC; 9/7: 14.4 sec with RW, 18.5 sec with SBQC   Time 12    Period Weeks    Status MET   Target Date 05/18/22    PT LONG TERM GOAL #6  Title Pt will decrease TUG to 14 sec or below with SBQC in order to demonstrate decreased fall risk.   Baseline  9/7: 14.4 sec with RW, 18.5 sec with SBQC; 11/30=16.85 sec without an AD. 09/15/2022= 22.14 sec with SPC; 11/08/2022= 25.17 sec without and AD and 21.22 sec with SPC. 01/31/2023= 22.64 sec without a AD and 24.64 sec with SBQC  Time 12   Period Weeks   Status ONGOING  Target Date 04/27/2023    PT LONG TERM GOAL #7  Title Patient will increase six minute walk test distance to >700 ft for improved gait ability and increased ease with community participation.  Baseline 9/7: completes 4 minutes and 444 ft with RW. Test discontinued at 4 minutes due to fatgiue. 08/11/2022= patient ambulated 200 feet yet required 1 seated rest break- due to BLE fatigue- using SBQC: 09/15/2022= Patient ambulated 300 feet in 4 min 20 sec with 1 seated rest break using SBQC. 11/08/2022 = 175 feet- stopped due to exhausted today- 2 min 10 sec; 01/17/2023= 200 feet in 2 min 38 sec; 02/02/2023= 160 feet with use of SBQC in 3 min 45 sec prior to stopping.   Time 12   Period Weeks   Status ONGOING  Target Date 04/27/2023   PT LONG TERM GOAL #8  Title Pt will decrease 5TSTS by at least 2 seconds (without UE support)  in order to demonstrate clinically significant improvement in LE strength.   Baseline 08/11/2022= 16.33 sec with B hands on knees; 09/15/2022= 22 sec with B hands on knees (patient reports increased weakness since around Christmas and just has not bounced back); 11/08/2022= 18.85 without UE Support; 01/17/2023=30.0 sec without UE support- patient reported not having a good day but tried his best. 02/02/2023- 17.74 sec with UE Support   Time 12   Period Weeks  Status ONGOING  Target Date 04/27/2023  PT LONG TERM GOAL #9  Title Pt will improve BERG by at least 3 points in order to demonstrate clinically significant improvement in balance.   Baseline 11/08/2022= 33/56; 01/17/2023= 45/56; 02/02/2023=45/56  Time 12   Period Weeks  Status PROGRESSING   Target Date 04/27/2023       Plan -     Clinical Impression Statement Patient struggled more overall today- Fatigued with Nustep initially and then with each therex activity. He was particularly fatigued with standing hip abd and stopped after completing his 1st set stating he was just tired. Will continue to need to focus on LE strengthening and endurance based  activities. Patient will continue to benefit from skilled PT services to address deficits and impairment identified in evaluation in order to maximize independence and safety in basic mobility required for performance of ADL, IADL, and leisure.    Personal Factors and Comorbidities Age;Past/Current Experience;Time since onset of injury/illness/exacerbation;Comorbidity 3+    Comorbidities COPD, History of Cancer, myasthenia gravis, chronic lumbar surgical history    Examination-Activity Limitations Lift;Squat;Bend;Stairs;Stand;Transfers;Probation officer;Bathing;Hygiene/Grooming;Dressing;Toileting;Bed Mobility;Caring for Others;Continence    Examination-Participation Restrictions Yard Work;Church;Cleaning;Driving;Community Activity    Stability/Clinical Decision Making Evolving/Moderate complexity    Rehab Potential Good    Clinical Impairments Affecting Rehab Potential multipe comorbidities    PT Frequency 1-2x / week    PT Duration 12 weeks    PT Treatment/Interventions ADLs/Self Care Home Management;Electrical Stimulation;Moist Heat;Traction;Ultrasound;Gait training;Stair training;Functional mobility training;Therapeutic activities;Therapeutic exercise;Balance training;Neuromuscular re-education;Patient/family education;Manual techniques;Passive range of motion;Dry needling;Joint Manipulations;Cryotherapy;Vestibular;Canalith Repostioning    PT Next Visit Plan Progress LE strengthening, balance training,  and functional endurance.   PT Home Exercise Plan standing strengthening and balance, seated strengthening and coordination.    Consulted and Agree with Plan of Care Patient              Louis Meckel, PT Physical Therapist- [facility]  02/16/23, 5:04 PM

## 2023-02-20 ENCOUNTER — Other Ambulatory Visit (INDEPENDENT_AMBULATORY_CARE_PROVIDER_SITE_OTHER): Payer: Self-pay | Admitting: Nurse Practitioner

## 2023-02-21 ENCOUNTER — Ambulatory Visit: Payer: Medicare Other

## 2023-02-23 ENCOUNTER — Ambulatory Visit: Payer: Medicare Other

## 2023-02-28 ENCOUNTER — Ambulatory Visit: Payer: Medicare Other

## 2023-02-28 DIAGNOSIS — R2681 Unsteadiness on feet: Secondary | ICD-10-CM

## 2023-02-28 DIAGNOSIS — R2689 Other abnormalities of gait and mobility: Secondary | ICD-10-CM

## 2023-02-28 DIAGNOSIS — M6281 Muscle weakness (generalized): Secondary | ICD-10-CM

## 2023-02-28 DIAGNOSIS — R262 Difficulty in walking, not elsewhere classified: Secondary | ICD-10-CM | POA: Diagnosis not present

## 2023-02-28 NOTE — Therapy (Signed)
OUTPATIENT PHYSICAL THERAPY TREATMENT NOTE    Patient Name: Francisco [name] MRN: [id number] DOB:04-Sep-1946, 77 y.o., male Today's Date: 02/28/2023  PCP: Lynnea Ferrier, MD REFERRING PROVIDER: Venetia Night MD   PT End of Session - 02/28/23 1510     Visit Number 68    Number of Visits 88   Dated correct since most recent recert   Date for PT Re-Evaluation 04/27/23    Authorization Type [employer] Medicare    Authorization Time Period 08/11/22-11/03/2022    Progress Note Due on Visit 70    PT Start Time 1515    PT Stop Time 1559    PT Time Calculation (min) 44 min    Equipment Utilized During Treatment Gait belt    Activity Tolerance Patient tolerated treatment well;No increased pain;Patient limited by fatigue    Behavior During Therapy [facility] for tasks assessed/performed                      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   Carotid stenosis    CHF (congestive heart failure) (HCC)    CKD (chronic kidney disease)    CKD (chronic kidney disease) stage 3, GFR 30-59 ml/min (HCC)    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   DDD (degenerative disc disease), cervical    Dysrhythmia    post carotid stent bradycardia; PAF 09/2020   GERD (gastroesophageal reflux disease)    Hypercholesterolemia    Hypertension    Hypothyroidism    pt takes Levothyroxine daily   Lumbosacral spinal stenosis    Myasthenia gravis, adult form (HCC)    PAD (peripheral artery disease) (HCC)    Parkinson disease    Shortness of breath    Lung MD- Dr Joetta Manners   Sleep apnea    do not use CPAP every night   Past Surgical History:  Procedure Laterality Date   ANTERIOR CERVICAL  DECOMP/DISCECTOMY FUSION  07/18/2011   Procedure: ANTERIOR CERVICAL DECOMPRESSION/DISCECTOMY FUSION 2 LEVELS;  Surgeon: Kathaleen Maser Pool;  Location: [facility];  Service: Neurosurgery;  Laterality: N/A;  cervical five-six, cervical six-seven anterior cervical discectomy and fusion   ATRIAL FIBRILLATION ABLATION     BACK SURGERY     in 1985 [facility]   BILATERAL CARPAL TUNNEL RELEASE     01/2020 Right, 04/2020 Left   CARDIAC CATHETERIZATION     2005 at [facility], no stents   CAROTID PTA/STENT INTERVENTION N/A 09/17/2020   Procedure: CAROTID PTA/STENT INTERVENTION;  Surgeon: Annice Needy, MD;  Location: [facility];  Service: Cardiovascular;  Laterality: N/A;   CATARACT EXTRACTION W/PHACO Left 01/06/2020   Procedure: CATARACT EXTRACTION PHACO AND INTRAOCULAR LENS PLACEMENT (IOC) ISTENT INJ LEFT 3.81  00:33.3;  Surgeon: Nevada Crane, MD;  Location: [facility];  Service: Ophthalmology;  Laterality: Left;   CATARACT EXTRACTION W/PHACO Right 02/03/2020   Procedure: CATARACT EXTRACTION PHACO AND INTRAOCULAR LENS PLACEMENT (IOC) RIGHT ISTENT INJ;  Surgeon: Nevada Crane, MD;  Location: [facility];  Service: Ophthalmology;  Laterality: Right;  4.29 0:35.6   COLONOSCOPY  HERNIA REPAIR Left    inguinal hernia repair in 1985   LUMBAR LAMINECTOMY/DECOMPRESSION MICRODISCECTOMY Left 02/24/2014   Procedure: LUMBAR LAMINECTOMY/DECOMPRESSION MICRODISCECTOMY LUMBAR THREE-FOUR, FOUR-FIVE, LEFT FIVE-SACRAL ONE ;  Surgeon: Temple Pacini, MD;  Location: [facility];  Service: Neurosurgery;  Laterality: Left;  LUMBAR LAMINECTOMY/DECOMPRESSION MICRODISCECTOMY LUMBAR THREE-FOUR, FOUR-FIVE, LEFT FIVE-SACRAL ONE    LUMBAR LAMINECTOMY/DECOMPRESSION MICRODISCECTOMY N/A 05/03/2021   Procedure: Laminectomy and Foraminotomy - L2-L3;  Surgeon: Julio Sicks, MD;  Location: [facility];  Service: Neurosurgery;  Laterality: N/A;  3C   POSTERIOR CERVICAL FUSION/FORAMINOTOMY N/A 08/07/2020   Procedure:  C3-6 POSTERIOR FUSION WITH DECOMPRESSION;  Surgeon: Venetia Night, MD;  Location: [facility];  Service: Neurosurgery;  Laterality: N/A;   PROSTATECTOMY  04/2013   [facility] Dr Joycelyn Das    Patient Active Problem List   Diagnosis Date Noted   SVT (supraventricular tachycardia) 12/30/2021   Hypothyroidism 12/30/2021   Parkinson's disease 12/30/2021   Elevated troponin 12/30/2021   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   Acquired thrombophilia (HCC) 01/13/2021   History of decompression of median nerve 01/01/2021   Paroxysmal atrial fibrillation (HCC) 10/06/2020   Carotid stenosis, right 09/17/2020   S/P cervical spinal fusion    Leukocytosis    Essential hypertension    Anemia of chronic disease    Postoperative pain    Neuropathic pain    Cervical myelopathy (HCC) 08/07/2020   Preop cardiovascular exam 07/17/2020   SOB (shortness of breath) on exertion 07/17/2020   Leg weakness, bilateral 07/13/2020   Aortic atherosclerosis (HCC) 07/09/2020   Body mass index (BMI) 34.0-34.9, adult 12/25/2019   Myalgia 10/30/2019   Lumbar post-laminectomy syndrome 10/24/2019   PAD (peripheral artery disease) (HCC) 06/06/2019   CKD (chronic kidney disease) stage 3, GFR 30-59 ml/min (HCC) 04/29/2019   B12 deficiency 01/23/2019   Left arm numbness 02/28/2018   Neck pain 02/28/2018   Anemia 02/02/2017   DDD (degenerative disc disease), cervical 02/02/2017   Hypothyroid 02/02/2017   MRSA (methicillin resistant staph aureus) culture positive 02/02/2017   Nocturnal hypoxia 02/02/2017   Senile purpura (HCC) 10/03/2016   Essential hypertension, benign 09/16/2016   Bilateral carotid artery disease (HCC) 09/16/2016   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/18/2016   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   History of prostate cancer 12/21/2015   Left carpal tunnel syndrome 11/11/2015   Kidney stone on left side 07/05/2015   Myasthenia gravis (HCC) 05/26/2015   Radiculitis 01/05/2015    Long-term use of high-risk medication 10/15/2014   Persistent cough 09/10/2014   Pure hypercholesterolemia 07/25/2014   Spinal stenosis, lumbar region, with neurogenic claudication 02/24/2014   Lumbosacral stenosis with neurogenic claudication 02/24/2014   COPD (chronic obstructive pulmonary disease) (HCC) 01/22/2014    REFERRING DIAG: balance disorder   THERAPY DIAG:  Difficulty in walking, not elsewhere classified  Muscle weakness (generalized)  Unsteadiness on feet  Other abnormalities of gait and mobility  Rationale for Evaluation and Treatment Rehabilitation  PERTINENT HISTORY: Francisco [name] is a 77 year old male referred to OPPT neuro for difficulty with balance. He was also just recently dx in May 2023 with Impingement syndrome of left shoulder (M75.42) Impingement syndrome of left shoulder (primary encounter diagnosis) Left shoulder pain, unspecified chronicity Nontraumatic rupture of left proximal biceps tendon Past medical hx includes Lumbar laminectomy 04/2021. C3-6 decompression, fusion on 11/26, myasthenia gravis, 2012 ACDF 5/6, 6/7; OSA, hypothyroidism, HTN, COPD, multiple back surgeries, 2021 carpal tunnel release.  PRECAUTIONS: Fall  SUBJECTIVE: Patient reports R shoulder  pain and low back pain that radiates to R leg. Reports woke up yelling from pain in R leg.     PAIN:  Are you having pain? R arm pain and R back/leg pain   TODAY'S TREATMENT:    02/28/23   THEREX:   Sidelying:  Arm at 45 degrees modified thoracic stretch (added to HEP) 30 seconds x2 trials   Supine:  GTB march with Tra activation 10x each side GTB abduction 15x GTB abduction single limb with posterior pelvic tilt 10x each side Robber stretch 30 seconds   Manual: Fascial manipulation x 24 minutes to R lumbar & thoracic paraspinals & multifidi, as well as R QL mm with noted resting tension at multifidi & paraspinals ~T6-T10, and L3-S1 in L sidelying position. Pt reported highest of  8/10 pain with manual pressure, which relieved after bouts of tissue mobilization in multi-directional pattern.    TherAct: Transition from seated in transport chair: SPT with QC and CGA; seated to sidelying with CGA on plinth table   Pt educated throughout session about proper posture and technique with exercises. Improved exercise technique, movement at target joints, use of target muscles after min to mod verbal, visual, tactile cues.   PATIENT EDUCATION: Education details: Goals, plan, exercise technique, body mechanics, energy conservation techniques  Person educated: Patient Education method: Explanation, Demonstration, Tactile cues, Verbal cues, and Handouts Education comprehension: verbalized understanding, returned demonstration, verbal cues required, and tactile cues required   HOME EXERCISE PROGRAM:  No Updates today  Access Code: [email or web address] URL: [email or web address] Date: 03/01/2022 Prepared by: Precious Bard  Exercises - Seated March  - 1 x daily - 7 x weekly - 2 sets - 10 reps - 5 hold - Seated Long Arc Quad  - 1 x daily - 7 x weekly - 2 sets - 10 reps - 5 hold - Seated Hip Abduction  - 1 x daily - 7 x weekly - 2 sets - 10 reps - 5 hold - Seated Hip Adduction Isometrics with Ball  - 1 x daily - 7 x weekly - 2 sets - 10 reps - 5 hold - Seated Gluteal Sets  - 1 x daily - 7 x weekly - 2 sets - 10 reps - 5 hold - Seated Heel Toe Raises  - 1 x daily - 7 x weekly - 2 sets - 10 reps - 5 hold LE strength and balance         PT Short Term Goals       PT SHORT TERM GOAL #1   Title Pt will be independent with initial HEP in order to improve strength and balance in order to decrease fall risk and improve function at home and work.    Baseline 02/23/2022- Patient reports not doing much as far as exercise in the home currently. 04/20/2022- Patient reports compliant with HEP and no questions at this time.; 9/7 pt indep   Time 6    Period Weeks    Status Goal met    Target Date 04/06/22              PT Long Term Goals       PT LONG TERM GOAL #1   Title Pt will be independent with final HEP in order to improve strength and balance in order to decrease fall risk and improve function at home and work. 04/20/2022- Patient reports compliant with HEP and no questions at this time.    Baseline 02/23/2022= No formal HEP in place. 9/7:  pt performing at least once a day, feels comfortable   Time 12    Period Weeks    Status GOAL MET   Target Date 08/11/2022      PT LONG TERM GOAL #2   Title Pt will decrease 5TSTS by at least 8 seconds in order to demonstrate clinically significant improvement in LE strength.    Baseline 02/23/2022= 31.52 sec with B hands on knees; 04/20/2022= 16.08 sec with B hands on knees   Time 12    Period Days    Status Goal Met   Target Date 05/18/22      PT LONG TERM GOAL #3   Title Pt will increase by at least 0.23 m/s in order to demonstrate clinically significant improvement in community ambulation.    Baseline 02/23/2022= 0.39 m/s using RW; 0.45 m/s; 9/7: 0.37 m/s with QC, 0.74 m/s with RW   Time 12    Period Weeks    Status GOAL MET   Target Date 05/18/22      PT LONG TERM GOAL #4   Title Pt will improve FOTO to target score of 50 to display perceived improvements in ability to complete ADL's.    Baseline 02/23/2022= 47; 9/7: 49; 11/30=54%   Time 12    Period Weeks    Status GOAL MET   Target Date 08/11/2022      PT LONG TERM GOAL #5   Title Pt will decrease TUG to below 20 seconds/decrease in order to demonstrate decreased fall risk.    Baseline 02/23/2022=28.27 sec with RW; 04/20/2022= 20.22 with SBQC; 9/7: 14.4 sec with RW, 18.5 sec with SBQC   Time 12    Period Weeks    Status MET   Target Date 05/18/22    PT LONG TERM GOAL #6  Title Pt will decrease TUG to 14 sec or below with SBQC in order to demonstrate decreased fall risk.   Baseline  9/7: 14.4 sec with RW, 18.5 sec with SBQC; 11/30=16.85 sec without  an AD. 09/15/2022= 22.14 sec with SPC; 11/08/2022= 25.17 sec without and AD and 21.22 sec with SPC. 01/31/2023= 22.64 sec without a AD and 24.64 sec with SBQC  Time 12   Period Weeks   Status ONGOING  Target Date 04/27/2023   PT LONG TERM GOAL #7  Title Patient will increase six minute walk test distance to >700 ft for improved gait ability and increased ease with community participation.  Baseline 9/7: completes 4 minutes and 444 ft with RW. Test discontinued at 4 minutes due to fatgiue. 08/11/2022= patient ambulated 200 feet yet required 1 seated rest break- due to BLE fatigue- using SBQC: 09/15/2022= Patient ambulated 300 feet in 4 min 20 sec with 1 seated rest break using SBQC. 11/08/2022 = 175 feet- stopped due to exhausted today- 2 min 10 sec; 01/17/2023= 200 feet in 2 min 38 sec; 02/02/2023= 160 feet with use of SBQC in 3 min 45 sec prior to stopping.   Time 12   Period Weeks   Status ONGOING  Target Date 04/27/2023   PT LONG TERM GOAL #8  Title Pt will decrease 5TSTS by at least 2 seconds (without UE support)  in order to demonstrate clinically significant improvement in LE strength.   Baseline 08/11/2022= 16.33 sec with B hands on knees; 09/15/2022= 22 sec with B hands on knees (patient reports increased weakness since around Christmas and just has not bounced back); 11/08/2022= 18.85 without UE Support; 01/17/2023=30.0 sec without UE support- patient  reported not having a good day but tried his best. 02/02/2023- 17.74 sec with UE Support   Time 12   Period Weeks  Status ONGOING  Target Date 04/27/2023       PT LONG TERM GOAL #9  Title Pt will improve BERG by at least 3 points in order to demonstrate clinically significant improvement in balance.   Baseline 11/08/2022= 33/56; 01/17/2023= 45/56; 02/02/2023=45/56  Time 12   Period Weeks  Status PROGRESSING   Target Date 04/27/2023       Plan -     Clinical Impression Statement Patient presents with high pain levels, reports legs giving out and  radiating symptoms. Extensive soft tissue work in thoracic spine performed and lumbar with patient having significant improvement of posture and tranfers by end of session.  Core activation training performed with cueing required extensively initially but decreased by end of session. Patient will continue to benefit from skilled PT services to address deficits and impairment identified in evaluation in order to maximize independence and safety in basic mobility required for performance of ADL, IADL, and leisure.    Personal Factors and Comorbidities Age;Past/Current Experience;Time since onset of injury/illness/exacerbation;Comorbidity 3+    Comorbidities COPD, History of Cancer, myasthenia gravis, chronic lumbar surgical history    Examination-Activity Limitations Lift;Squat;Bend;Stairs;Stand;Transfers;Probation officer;Bathing;Hygiene/Grooming;Dressing;Toileting;Bed Mobility;Caring for Others;Continence    Examination-Participation Restrictions Yard Work;Church;Cleaning;Driving;Community Activity    Stability/Clinical Decision Making Evolving/Moderate complexity    Rehab Potential Good    Clinical Impairments Affecting Rehab Potential multipe comorbidities    PT Frequency 1-2x / week    PT Duration 12 weeks    PT Treatment/Interventions ADLs/Self Care Home Management;Electrical Stimulation;Moist Heat;Traction;Ultrasound;Gait training;Stair training;Functional mobility training;Therapeutic activities;Therapeutic exercise;Balance training;Neuromuscular re-education;Patient/family education;Manual techniques;Passive range of motion;Dry needling;Joint Manipulations;Cryotherapy;Vestibular;Canalith Repostioning    PT Next Visit Plan Progress LE strengthening, balance training,  and functional endurance.   PT Home Exercise Plan standing strengthening and balance, seated strengthening and coordination.    Consulted and Agree with Plan of Care Patient              Precious Bard  PT  Physical Therapist- [facility]  02/28/23, 4:03 PM

## 2023-03-02 ENCOUNTER — Telehealth: Payer: Self-pay

## 2023-03-02 ENCOUNTER — Ambulatory Visit: Payer: Medicare Other

## 2023-03-02 NOTE — Telephone Encounter (Signed)
850 am.  New Palliative Care Referral received for patient.  Phone call made to discuss services with patient.  Call unsuccessful.  Message left requesting a call back.

## 2023-03-06 ENCOUNTER — Telehealth: Payer: Self-pay

## 2023-03-06 NOTE — Telephone Encounter (Signed)
1225 pm.  2nd attempt.  Phone call made to wife [name] to follow up on Palliative Care Referral.  Call unsuccessful.  Message left requesting a callback.

## 2023-03-07 ENCOUNTER — Encounter: Payer: Self-pay | Admitting: Physical Therapy

## 2023-03-07 ENCOUNTER — Ambulatory Visit: Payer: Medicare Other | Admitting: Physical Therapy

## 2023-03-07 DIAGNOSIS — M545 Low back pain, unspecified: Secondary | ICD-10-CM

## 2023-03-07 DIAGNOSIS — R262 Difficulty in walking, not elsewhere classified: Secondary | ICD-10-CM | POA: Diagnosis not present

## 2023-03-07 DIAGNOSIS — R2689 Other abnormalities of gait and mobility: Secondary | ICD-10-CM

## 2023-03-07 DIAGNOSIS — G8929 Other chronic pain: Secondary | ICD-10-CM

## 2023-03-07 DIAGNOSIS — R2681 Unsteadiness on feet: Secondary | ICD-10-CM

## 2023-03-07 DIAGNOSIS — R296 Repeated falls: Secondary | ICD-10-CM

## 2023-03-07 DIAGNOSIS — M6281 Muscle weakness (generalized): Secondary | ICD-10-CM

## 2023-03-07 DIAGNOSIS — G959 Disease of spinal cord, unspecified: Secondary | ICD-10-CM

## 2023-03-07 DIAGNOSIS — R278 Other lack of coordination: Secondary | ICD-10-CM

## 2023-03-07 DIAGNOSIS — R269 Unspecified abnormalities of gait and mobility: Secondary | ICD-10-CM

## 2023-03-07 NOTE — Therapy (Signed)
OUTPATIENT PHYSICAL THERAPY TREATMENT NOTE    Patient Name: Francisco [name] MRN: [id number] DOB:07-24-1946, 77 y.o., male Today's Date: 03/07/2023  PCP: Lynnea Ferrier, MD REFERRING PROVIDER: Venetia Night MD   PT End of Session - 03/07/23 1433     Visit Number 69    Number of Visits 88   Dated correct since most recent recert   Date for PT Re-Evaluation 04/27/23    Authorization Type [employer] Medicare    Authorization Time Period 08/11/22-11/03/2022    Progress Note Due on Visit 70    PT Start Time 1435    PT Stop Time 1515    PT Time Calculation (min) 40 min    Equipment Utilized During Treatment Gait belt    Activity Tolerance Patient tolerated treatment well;No increased pain;Patient limited by fatigue    Behavior During Therapy [facility] for tasks assessed/performed                      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   Carotid stenosis    CHF (congestive heart failure) (HCC)    CKD (chronic kidney disease)    CKD (chronic kidney disease) stage 3, GFR 30-59 ml/min (HCC)    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   DDD (degenerative disc disease), cervical    Dysrhythmia    post carotid stent bradycardia; PAF 09/2020   GERD (gastroesophageal reflux disease)    Hypercholesterolemia    Hypertension    Hypothyroidism    pt takes Levothyroxine daily   Lumbosacral spinal stenosis    Myasthenia gravis, adult form (HCC)    PAD (peripheral artery disease) (HCC)    Parkinson disease    Shortness of breath    Lung MD- Dr Joetta Manners   Sleep apnea    do not use CPAP every night   Past Surgical History:  Procedure Laterality Date   ANTERIOR CERVICAL  DECOMP/DISCECTOMY FUSION  07/18/2011   Procedure: ANTERIOR CERVICAL DECOMPRESSION/DISCECTOMY FUSION 2 LEVELS;  Surgeon: Kathaleen Maser Pool;  Location: [facility];  Service: Neurosurgery;  Laterality: N/A;  cervical five-six, cervical six-seven anterior cervical discectomy and fusion   ATRIAL FIBRILLATION ABLATION     BACK SURGERY     in 1985 [facility]   BILATERAL CARPAL TUNNEL RELEASE     01/2020 Right, 04/2020 Left   CARDIAC CATHETERIZATION     2005 at [facility], no stents   CAROTID PTA/STENT INTERVENTION N/A 09/17/2020   Procedure: CAROTID PTA/STENT INTERVENTION;  Surgeon: Annice Needy, MD;  Location: [facility];  Service: Cardiovascular;  Laterality: N/A;   CATARACT EXTRACTION W/PHACO Left 01/06/2020   Procedure: CATARACT EXTRACTION PHACO AND INTRAOCULAR LENS PLACEMENT (IOC) ISTENT INJ LEFT 3.81  00:33.3;  Surgeon: Nevada Crane, MD;  Location: [facility];  Service: Ophthalmology;  Laterality: Left;   CATARACT EXTRACTION W/PHACO Right 02/03/2020   Procedure: CATARACT EXTRACTION PHACO AND INTRAOCULAR LENS PLACEMENT (IOC) RIGHT ISTENT INJ;  Surgeon: Nevada Crane, MD;  Location: [facility];  Service: Ophthalmology;  Laterality: Right;  4.29 0:35.6   COLONOSCOPY  HERNIA REPAIR Left    inguinal hernia repair in 1985   LUMBAR LAMINECTOMY/DECOMPRESSION MICRODISCECTOMY Left 02/24/2014   Procedure: LUMBAR LAMINECTOMY/DECOMPRESSION MICRODISCECTOMY LUMBAR THREE-FOUR, FOUR-FIVE, LEFT FIVE-SACRAL ONE ;  Surgeon: Temple Pacini, MD;  Location: [facility];  Service: Neurosurgery;  Laterality: Left;  LUMBAR LAMINECTOMY/DECOMPRESSION MICRODISCECTOMY LUMBAR THREE-FOUR, FOUR-FIVE, LEFT FIVE-SACRAL ONE    LUMBAR LAMINECTOMY/DECOMPRESSION MICRODISCECTOMY N/A 05/03/2021   Procedure: Laminectomy and Foraminotomy - L2-L3;  Surgeon: Julio Sicks, MD;  Location: [facility];  Service: Neurosurgery;  Laterality: N/A;  3C   POSTERIOR CERVICAL FUSION/FORAMINOTOMY N/A 08/07/2020   Procedure:  C3-6 POSTERIOR FUSION WITH DECOMPRESSION;  Surgeon: Venetia Night, MD;  Location: [facility];  Service: Neurosurgery;  Laterality: N/A;   PROSTATECTOMY  04/2013   [facility] Dr Joycelyn Das    Patient Active Problem List   Diagnosis Date Noted   SVT (supraventricular tachycardia) 12/30/2021   Hypothyroidism 12/30/2021   Parkinson's disease 12/30/2021   Elevated troponin 12/30/2021   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   Acquired thrombophilia (HCC) 01/13/2021   History of decompression of median nerve 01/01/2021   Paroxysmal atrial fibrillation (HCC) 10/06/2020   Carotid stenosis, right 09/17/2020   S/P cervical spinal fusion    Leukocytosis    Essential hypertension    Anemia of chronic disease    Postoperative pain    Neuropathic pain    Cervical myelopathy (HCC) 08/07/2020   Preop cardiovascular exam 07/17/2020   SOB (shortness of breath) on exertion 07/17/2020   Leg weakness, bilateral 07/13/2020   Aortic atherosclerosis (HCC) 07/09/2020   Body mass index (BMI) 34.0-34.9, adult 12/25/2019   Myalgia 10/30/2019   Lumbar post-laminectomy syndrome 10/24/2019   PAD (peripheral artery disease) (HCC) 06/06/2019   CKD (chronic kidney disease) stage 3, GFR 30-59 ml/min (HCC) 04/29/2019   B12 deficiency 01/23/2019   Left arm numbness 02/28/2018   Neck pain 02/28/2018   Anemia 02/02/2017   DDD (degenerative disc disease), cervical 02/02/2017   Hypothyroid 02/02/2017   MRSA (methicillin resistant staph aureus) culture positive 02/02/2017   Nocturnal hypoxia 02/02/2017   Senile purpura (HCC) 10/03/2016   Essential hypertension, benign 09/16/2016   Bilateral carotid artery disease (HCC) 09/16/2016   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/18/2016   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   History of prostate cancer 12/21/2015   Left carpal tunnel syndrome 11/11/2015   Kidney stone on left side 07/05/2015   Myasthenia gravis (HCC) 05/26/2015   Radiculitis 01/05/2015    Long-term use of high-risk medication 10/15/2014   Persistent cough 09/10/2014   Pure hypercholesterolemia 07/25/2014   Spinal stenosis, lumbar region, with neurogenic claudication 02/24/2014   Lumbosacral stenosis with neurogenic claudication 02/24/2014   COPD (chronic obstructive pulmonary disease) (HCC) 01/22/2014    REFERRING DIAG: balance disorder   THERAPY DIAG:  Difficulty in walking, not elsewhere classified  Muscle weakness (generalized)  Unsteadiness on feet  Other abnormalities of gait and mobility  Chronic bilateral low back pain, unspecified whether sciatica present  Other lack of coordination  Repeated falls  Cervical myelopathy (HCC)  Abnormality of gait and mobility  Chronic left-sided low back pain with left-sided sciatica  Rationale for Evaluation and Treatment Rehabilitation  PERTINENT HISTORY: Hobart Marte is a 77 year old male referred to OPPT neuro for difficulty with balance. He was also just recently dx in May 2023 with Impingement syndrome of left shoulder (M75.42) Impingement syndrome of left shoulder (primary encounter diagnosis) Left shoulder pain, unspecified chronicity Nontraumatic rupture of left proximal biceps tendon Past  medical hx includes Lumbar laminectomy 04/2021. C3-6 decompression, fusion on 11/26, myasthenia gravis, 2012 ACDF 5/6, 6/7; OSA, hypothyroidism, HTN, COPD, multiple back surgeries, 2021 carpal tunnel release.  PRECAUTIONS: Fall  SUBJECTIVE: Patient reports that he is doing okay today, but had a near fall at church on Sunday from L knee buckling. Required assistance to prevent fall.     PAIN:  Are you having pain? R arm pain and R back/leg pain   TODAY'S TREATMENT:    03/07/23   THEREX:   Sidelying: Open book with 3 sec hold x 10 bil  Hip abduction via clam shell GTB x 10 bil   Supine:  GTB march with TrA activation 10x each side GTB abduction 15x Bridge with TA activation x 5   Sit<>stand with UE on  thighs x 10 with cues for eccentric control.   TherAct: Transition from seated in transport chair: SPT with QC and CGA; seated to sidelying with CGA on plinth table. Lateral scooting and roll to side performed with increased time and min cues for log roll technique and awareness of lateral aspect of bed.    Pt educated throughout session about proper posture and technique with exercises. Improved exercise technique, movement at target joints, use of target muscles after min to mod verbal, visual, tactile cues.   PATIENT EDUCATION: Education details: Goals, plan, exercise technique, body mechanics, energy conservation techniques  Person educated: Patient Education method: Explanation, Demonstration, Tactile cues, Verbal cues, and Handouts Education comprehension: verbalized understanding, returned demonstration, verbal cues required, and tactile cues required   HOME EXERCISE PROGRAM:  No Updates today  Access Code: [email or web address] URL: [email or web address] Date: 03/01/2022 Prepared by: Precious Bard  Exercises - Seated March  - 1 x daily - 7 x weekly - 2 sets - 10 reps - 5 hold - Seated Long Arc Quad  - 1 x daily - 7 x weekly - 2 sets - 10 reps - 5 hold - Seated Hip Abduction  - 1 x daily - 7 x weekly - 2 sets - 10 reps - 5 hold - Seated Hip Adduction Isometrics with Ball  - 1 x daily - 7 x weekly - 2 sets - 10 reps - 5 hold - Seated Gluteal Sets  - 1 x daily - 7 x weekly - 2 sets - 10 reps - 5 hold - Seated Heel Toe Raises  - 1 x daily - 7 x weekly - 2 sets - 10 reps - 5 hold LE strength and balance         PT Short Term Goals       PT SHORT TERM GOAL #1   Title Pt will be independent with initial HEP in order to improve strength and balance in order to decrease fall risk and improve function at home and work.    Baseline 02/23/2022- Patient reports not doing much as far as exercise in the home currently. 04/20/2022- Patient reports compliant with HEP and no questions at  this time.; 9/7 pt indep   Time 6    Period Weeks    Status Goal met   Target Date 04/06/22              PT Long Term Goals       PT LONG TERM GOAL #1   Title Pt will be independent with final HEP in order to improve strength and balance in order to decrease fall risk and improve function at home and work. 04/20/2022- Patient reports compliant with HEP  and no questions at this time.    Baseline 02/23/2022= No formal HEP in place. 9/7: pt performing at least once a day, feels comfortable   Time 12    Period Weeks    Status GOAL MET   Target Date 08/11/2022      PT LONG TERM GOAL #2   Title Pt will decrease 5TSTS by at least 8 seconds in order to demonstrate clinically significant improvement in LE strength.    Baseline 02/23/2022= 31.52 sec with B hands on knees; 04/20/2022= 16.08 sec with B hands on knees   Time 12    Period Days    Status Goal Met   Target Date 05/18/22      PT LONG TERM GOAL #3   Title Pt will increase by at least 0.23 m/s in order to demonstrate clinically significant improvement in community ambulation.    Baseline 02/23/2022= 0.39 m/s using RW; 0.45 m/s; 9/7: 0.37 m/s with QC, 0.74 m/s with RW   Time 12    Period Weeks    Status GOAL MET   Target Date 05/18/22      PT LONG TERM GOAL #4   Title Pt will improve FOTO to target score of 50 to display perceived improvements in ability to complete ADL's.    Baseline 02/23/2022= 47; 9/7: 49; 11/30=54%   Time 12    Period Weeks    Status GOAL MET   Target Date 08/11/2022      PT LONG TERM GOAL #5   Title Pt will decrease TUG to below 20 seconds/decrease in order to demonstrate decreased fall risk.    Baseline 02/23/2022=28.27 sec with RW; 04/20/2022= 20.22 with SBQC; 9/7: 14.4 sec with RW, 18.5 sec with SBQC   Time 12    Period Weeks    Status MET   Target Date 05/18/22    PT LONG TERM GOAL #6  Title Pt will decrease TUG to 14 sec or below with SBQC in order to demonstrate decreased fall risk.    Baseline  9/7: 14.4 sec with RW, 18.5 sec with SBQC; 11/30=16.85 sec without an AD. 09/15/2022= 22.14 sec with SPC; 11/08/2022= 25.17 sec without and AD and 21.22 sec with SPC. 01/31/2023= 22.64 sec without a AD and 24.64 sec with SBQC  Time 12   Period Weeks   Status ONGOING  Target Date 04/27/2023   PT LONG TERM GOAL #7  Title Patient will increase six minute walk test distance to >700 ft for improved gait ability and increased ease with community participation.  Baseline 9/7: completes 4 minutes and 444 ft with RW. Test discontinued at 4 minutes due to fatgiue. 08/11/2022= patient ambulated 200 feet yet required 1 seated rest break- due to BLE fatigue- using SBQC: 09/15/2022= Patient ambulated 300 feet in 4 min 20 sec with 1 seated rest break using SBQC. 11/08/2022 = 175 feet- stopped due to exhausted today- 2 min 10 sec; 01/17/2023= 200 feet in 2 min 38 sec; 02/02/2023= 160 feet with use of SBQC in 3 min 45 sec prior to stopping.   Time 12   Period Weeks   Status ONGOING  Target Date 04/27/2023   PT LONG TERM GOAL #8  Title Pt will decrease 5TSTS by at least 2 seconds (without UE support)  in order to demonstrate clinically significant improvement in LE strength.   Baseline 08/11/2022= 16.33 sec with B hands on knees; 09/15/2022= 22 sec with B hands on knees (patient reports increased weakness since around Christmas  and just has not bounced back); 11/08/2022= 18.85 without UE Support; 01/17/2023=30.0 sec without UE support- patient reported not having a good day but tried his best. 02/02/2023- 17.74 sec with UE Support   Time 12   Period Weeks  Status ONGOING  Target Date 04/27/2023       PT LONG TERM GOAL #9  Title Pt will improve BERG by at least 3 points in order to demonstrate clinically significant improvement in balance.   Baseline 11/08/2022= 33/56; 01/17/2023= 45/56; 02/02/2023=45/56  Time 12   Period Weeks  Status PROGRESSING   Target Date 04/27/2023       Plan -     Clinical Impression  Statement Patient presents with reduced pain level. PT treatment able to focus on BLE and core activation on bed level. Min cues for safety and technique for log roll with movement. No increase in pain reported by pt on this day. Patient will continue to benefit from skilled PT services to address deficits and impairment identified in evaluation in order to maximize independence and safety in basic mobility required for performance of ADL, IADL, and leisure.    Personal Factors and Comorbidities Age;Past/Current Experience;Time since onset of injury/illness/exacerbation;Comorbidity 3+    Comorbidities COPD, History of Cancer, myasthenia gravis, chronic lumbar surgical history    Examination-Activity Limitations Lift;Squat;Bend;Stairs;Stand;Transfers;Probation officer;Bathing;Hygiene/Grooming;Dressing;Toileting;Bed Mobility;Caring for Others;Continence    Examination-Participation Restrictions Yard Work;Church;Cleaning;Driving;Community Activity    Stability/Clinical Decision Making Evolving/Moderate complexity    Rehab Potential Good    Clinical Impairments Affecting Rehab Potential multipe comorbidities    PT Frequency 1-2x / week    PT Duration 12 weeks    PT Treatment/Interventions ADLs/Self Care Home Management;Electrical Stimulation;Moist Heat;Traction;Ultrasound;Gait training;Stair training;Functional mobility training;Therapeutic activities;Therapeutic exercise;Balance training;Neuromuscular re-education;Patient/family education;Manual techniques;Passive range of motion;Dry needling;Joint Manipulations;Cryotherapy;Vestibular;Canalith Repostioning    PT Next Visit Plan Progress LE strengthening, balance training,  and functional endurance.   PT Home Exercise Plan standing strengthening and balance, seated strengthening and coordination.    Consulted and Agree with Plan of Care Patient              Golden Pop PT  Physical Therapist- [facility]  03/07/23, 4:04 PM

## 2023-03-08 ENCOUNTER — Telehealth: Payer: Self-pay

## 2023-03-08 NOTE — Telephone Encounter (Signed)
915 am.  Return call made to patient.  Call unsuccessful.  Message left requesting a call back.

## 2023-03-09 ENCOUNTER — Ambulatory Visit: Payer: Medicare Other

## 2023-03-09 DIAGNOSIS — G8929 Other chronic pain: Secondary | ICD-10-CM

## 2023-03-09 DIAGNOSIS — R278 Other lack of coordination: Secondary | ICD-10-CM

## 2023-03-09 DIAGNOSIS — R262 Difficulty in walking, not elsewhere classified: Secondary | ICD-10-CM

## 2023-03-09 DIAGNOSIS — R269 Unspecified abnormalities of gait and mobility: Secondary | ICD-10-CM

## 2023-03-09 DIAGNOSIS — M6281 Muscle weakness (generalized): Secondary | ICD-10-CM

## 2023-03-09 DIAGNOSIS — R296 Repeated falls: Secondary | ICD-10-CM

## 2023-03-09 DIAGNOSIS — G959 Disease of spinal cord, unspecified: Secondary | ICD-10-CM

## 2023-03-09 DIAGNOSIS — R2689 Other abnormalities of gait and mobility: Secondary | ICD-10-CM

## 2023-03-09 DIAGNOSIS — R2681 Unsteadiness on feet: Secondary | ICD-10-CM

## 2023-03-09 NOTE — Therapy (Signed)
OUTPATIENT PHYSICAL THERAPY TREATMENT NOTE/Physical Therapy Progress Note   Dates of reporting period  01/17/2023   to   03/09/2023    Patient Name: Francisco [name] MRN: [id number] DOB:[birth year], 77 y.o., male Today's Date: 03/10/2023  PCP: Lynnea Ferrier, MD REFERRING PROVIDER: Venetia Night MD   PT End of Session - 03/09/23 1521     Visit Number 70    Number of Visits 88   Dated correct since most recent recert   Date for PT Re-Evaluation 04/27/23    Authorization Type [employer] Medicare    Authorization Time Period 08/11/22-11/03/2022    Progress Note Due on Visit 80    PT Start Time 1515    PT Stop Time 1558    PT Time Calculation (min) 43 min    Equipment Utilized During Treatment Gait belt    Activity Tolerance Patient tolerated treatment well;No increased pain;Patient limited by fatigue    Behavior During Therapy 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   Carotid stenosis    CHF (congestive heart failure) (HCC)    CKD (chronic kidney disease)    CKD (chronic kidney disease) stage 3, GFR 30-59 ml/min (HCC)    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   DDD (degenerative disc disease), cervical    Dysrhythmia    post carotid stent bradycardia; PAF 09/2020   GERD (gastroesophageal reflux disease)    Hypercholesterolemia    Hypertension    Hypothyroidism    pt takes Levothyroxine daily   Lumbosacral spinal stenosis    Myasthenia gravis, adult form (HCC)    PAD (peripheral artery disease) (HCC)    Parkinson disease    Shortness of breath    Lung MD- Dr Joetta Manners   Sleep apnea    do not use CPAP every  night   Past Surgical History:  Procedure Laterality Date   ANTERIOR CERVICAL DECOMP/DISCECTOMY FUSION  07/18/2011   Procedure: ANTERIOR CERVICAL DECOMPRESSION/DISCECTOMY FUSION 2 LEVELS;  Surgeon: Kathaleen Maser Pool;  Location: [facility];  Service: Neurosurgery;  Laterality: N/A;  cervical five-six, cervical six-seven anterior cervical discectomy and fusion   ATRIAL FIBRILLATION ABLATION     BACK SURGERY     in 1985 [facility]   BILATERAL CARPAL TUNNEL RELEASE     01/2020 Right, 04/2020 Left   CARDIAC CATHETERIZATION     2005 at [facility], no stents   CAROTID PTA/STENT INTERVENTION N/A 09/17/2020   Procedure: CAROTID PTA/STENT INTERVENTION;  Surgeon: Annice Needy, MD;  Location: [facility] INVASIVE CV LAB;  Service: Cardiovascular;  Laterality: N/A;   CATARACT EXTRACTION W/PHACO Left 01/06/2020   Procedure: CATARACT EXTRACTION PHACO AND INTRAOCULAR LENS PLACEMENT (IOC) ISTENT INJ LEFT 3.81  00:33.3;  Surgeon: Nevada Crane, MD;  Location: [facility];  Service: Ophthalmology;  Laterality: Left;   CATARACT EXTRACTION W/PHACO Right 02/03/2020   Procedure: CATARACT EXTRACTION PHACO AND INTRAOCULAR LENS PLACEMENT (IOC) RIGHT ISTENT INJ;  Surgeon: Nevada Crane, MD;  Location: [facility];  Service: Ophthalmology;  Laterality: Right;  4.29 0:35.6   COLONOSCOPY     HERNIA REPAIR Left    inguinal hernia repair in 1985   LUMBAR LAMINECTOMY/DECOMPRESSION MICRODISCECTOMY Left 02/24/2014   Procedure: LUMBAR LAMINECTOMY/DECOMPRESSION MICRODISCECTOMY LUMBAR THREE-FOUR, FOUR-FIVE, LEFT FIVE-SACRAL ONE ;  Surgeon: Temple Pacini, MD;  Location: [facility];  Service: Neurosurgery;  Laterality: Left;  LUMBAR LAMINECTOMY/DECOMPRESSION MICRODISCECTOMY LUMBAR THREE-FOUR, FOUR-FIVE, LEFT FIVE-SACRAL ONE    LUMBAR LAMINECTOMY/DECOMPRESSION MICRODISCECTOMY N/A 05/03/2021   Procedure: Laminectomy and Foraminotomy - L2-L3;  Surgeon: Julio Sicks, MD;  Location: [facility] OR;  Service: Neurosurgery;   Laterality: N/A;  3C   POSTERIOR CERVICAL FUSION/FORAMINOTOMY N/A 08/07/2020   Procedure: C3-6 POSTERIOR FUSION WITH DECOMPRESSION;  Surgeon: Venetia Night, MD;  Location: [facility] ORS;  Service: Neurosurgery;  Laterality: N/A;   PROSTATECTOMY  04/2013   [facility] Dr Joycelyn Das    Patient Active Problem List   Diagnosis Date Noted   SVT (supraventricular tachycardia) 12/30/2021   Hypothyroidism 12/30/2021   Parkinson's disease 12/30/2021   Elevated troponin 12/30/2021   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   Acquired thrombophilia (HCC) 01/13/2021   History of decompression of median nerve 01/01/2021   Paroxysmal atrial fibrillation (HCC) 10/06/2020   Carotid stenosis, right 09/17/2020   S/P cervical spinal fusion    Leukocytosis    Essential hypertension    Anemia of chronic disease    Postoperative pain    Neuropathic pain    Cervical myelopathy (HCC) 08/07/2020   Preop cardiovascular exam 07/17/2020   SOB (shortness of breath) on exertion 07/17/2020   Leg weakness, bilateral 07/13/2020   Aortic atherosclerosis (HCC) 07/09/2020   Body mass index (BMI) 34.0-34.9, adult 12/25/2019   Myalgia 10/30/2019   Lumbar post-laminectomy syndrome 10/24/2019   PAD (peripheral artery disease) (HCC) 06/06/2019   CKD (chronic kidney disease) stage 3, GFR 30-59 ml/min (HCC) 04/29/2019   B12 deficiency 01/23/2019   Left arm numbness 02/28/2018   Neck pain 02/28/2018   Anemia 02/02/2017   DDD (degenerative disc disease), cervical 02/02/2017   Hypothyroid 02/02/2017   MRSA (methicillin resistant staph aureus) culture positive 02/02/2017   Nocturnal hypoxia 02/02/2017   Senile purpura (HCC) 10/03/2016   Essential hypertension, benign 09/16/2016   Bilateral carotid artery disease (HCC) 09/16/2016   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/18/2016   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   History of prostate cancer 12/21/2015   Left carpal tunnel syndrome 11/11/2015   Kidney  stone on left side 07/05/2015   Myasthenia gravis (HCC) 05/26/2015   Radiculitis 01/05/2015   Long-term use of high-risk medication 10/15/2014   Persistent cough 09/10/2014   Pure hypercholesterolemia 07/25/2014   Spinal stenosis, lumbar region, with neurogenic claudication 02/24/2014   Lumbosacral stenosis with neurogenic claudication 02/24/2014   COPD (chronic obstructive pulmonary disease) (HCC) 01/22/2014    REFERRING DIAG: balance disorder   THERAPY DIAG:  Difficulty in walking, not elsewhere classified  Muscle weakness (generalized)  Unsteadiness on feet  Other abnormalities of gait and mobility  Chronic bilateral low back pain, unspecified whether sciatica present  Other lack of coordination  Cervical myelopathy (HCC)  Abnormality of gait and mobility  Chronic left-sided low back pain with left-sided sciatica  Repeated falls  Rationale for Evaluation and Treatment Rehabilitation  PERTINENT HISTORY: Josaih Zurcher is a 77 year old male referred to OPPT neuro for difficulty with balance. He was also just recently dx in May 2023 with Impingement syndrome of left shoulder (M75.42) Impingement syndrome of left  shoulder (primary encounter diagnosis) Left shoulder pain, unspecified chronicity Nontraumatic rupture of left proximal biceps tendon Past medical hx includes Lumbar laminectomy 04/2021. C3-6 decompression, fusion on 11/26, myasthenia gravis, 2012 ACDF 5/6, 6/7; OSA, hypothyroidism, HTN, COPD, multiple back surgeries, 2021 carpal tunnel release.  PRECAUTIONS: Fall  SUBJECTIVE: Patient reports just not having a good week- left knee buckled on him while trying to get into his car and brother had to help him.     PAIN:  Are you having pain? R arm pain and R back/leg pain   TODAY'S TREATMENT:    03/09/2023    Physical therapy treatment session today consisted of completing assessment of goals and administration of testing as demonstrated and documented in flow  sheet, treatment, and goals section of this note. Addition treatments may be found below.    THEREX:  Gait in clinic using 4WW at end of testing around 250 feet from clinic out to car- VC for erect posture and 1 standing rest break in elevator     Pt educated throughout session about proper posture and technique with exercises. Improved exercise technique, movement at target joints, use of target muscles after min to mod verbal, visual, tactile cues.   PATIENT EDUCATION: Education details: Goals, plan, exercise technique, body mechanics, energy conservation techniques  Person educated: Patient Education method: Explanation, Demonstration, Tactile cues, Verbal cues, and Handouts Education comprehension: verbalized understanding, returned demonstration, verbal cues required, and tactile cues required   HOME EXERCISE PROGRAM:  No Updates today  Access Code: [email or web address] URL: [email or web address] Date: 03/01/2022 Prepared by: Precious Bard  Exercises - Seated March  - 1 x daily - 7 x weekly - 2 sets - 10 reps - 5 hold - Seated Long Arc Quad  - 1 x daily - 7 x weekly - 2 sets - 10 reps - 5 hold - Seated Hip Abduction  - 1 x daily - 7 x weekly - 2 sets - 10 reps - 5 hold - Seated Hip Adduction Isometrics with Ball  - 1 x daily - 7 x weekly - 2 sets - 10 reps - 5 hold - Seated Gluteal Sets  - 1 x daily - 7 x weekly - 2 sets - 10 reps - 5 hold - Seated Heel Toe Raises  - 1 x daily - 7 x weekly - 2 sets - 10 reps - 5 hold LE strength and balance         PT Short Term Goals       PT SHORT TERM GOAL #1   Title Pt will be independent with initial HEP in order to improve strength and balance in order to decrease fall risk and improve function at home and work.    Baseline 02/23/2022- Patient reports not doing much as far as exercise in the home currently. 04/20/2022- Patient reports compliant with HEP and no questions at this time.; 9/7 pt indep   Time 6    Period Weeks     Status Goal met   Target Date 04/06/22              PT Long Term Goals       PT LONG TERM GOAL #1   Title Pt will be independent with final HEP in order to improve strength and balance in order to decrease fall risk and improve function at home and work. 04/20/2022- Patient reports compliant with HEP and no questions at this time.    Baseline 02/23/2022= No formal HEP in place. 9/7:  pt performing at least once a day, feels comfortable   Time 12    Period Weeks    Status GOAL MET   Target Date 08/11/2022      PT LONG TERM GOAL #2   Title Pt will decrease 5TSTS by at least 8 seconds in order to demonstrate clinically significant improvement in LE strength.    Baseline 02/23/2022= 31.52 sec with B hands on knees; 04/20/2022= 16.08 sec with B hands on knees   Time 12    Period Days    Status Goal Met   Target Date 05/18/22      PT LONG TERM GOAL #3   Title Pt will increase by at least 0.23 m/s in order to demonstrate clinically significant improvement in community ambulation.    Baseline 02/23/2022= 0.39 m/s using RW; 0.45 m/s; 9/7: 0.37 m/s with QC, 0.74 m/s with RW   Time 12    Period Weeks    Status GOAL MET   Target Date 05/18/22      PT LONG TERM GOAL #4   Title Pt will improve FOTO to target score of 50 to display perceived improvements in ability to complete ADL's.    Baseline 02/23/2022= 47; 9/7: 49; 11/30=54%   Time 12    Period Weeks    Status GOAL MET   Target Date 08/11/2022      PT LONG TERM GOAL #5   Title Pt will decrease TUG to below 20 seconds/decrease in order to demonstrate decreased fall risk.    Baseline 02/23/2022=28.27 sec with RW; 04/20/2022= 20.22 with SBQC; 9/7: 14.4 sec with RW, 18.5 sec with SBQC   Time 12    Period Weeks    Status MET   Target Date 05/18/22    PT LONG TERM GOAL #6  Title Pt will decrease TUG to 14 sec or below with SBQC in order to demonstrate decreased fall risk.   Baseline  9/7: 14.4 sec with RW, 18.5 sec with SBQC;  11/30=16.85 sec without an AD. 09/15/2022= 22.14 sec with SPC; 11/08/2022= 25.17 sec without and AD and 21.22 sec with SPC. 01/31/2023= 22.64 sec without a AD and 24.64 sec with SBQC;  03/09/2023= 22.04 sec with SBQC and 14.88 sec with 4UJ.   Time 12   Period Weeks   Status ONGOING  Target Date 04/27/2023   PT LONG TERM GOAL #7  Title Patient will increase six minute walk test distance to >700 ft for improved gait ability and increased ease with community participation.  Baseline 9/7: completes 4 minutes and 444 ft with RW. Test discontinued at 4 minutes due to fatgiue. 08/11/2022= patient ambulated 200 feet yet required 1 seated rest break- due to BLE fatigue- using SBQC: 09/15/2022= Patient ambulated 300 feet in 4 min 20 sec with 1 seated rest break using SBQC. 11/08/2022 = 175 feet- stopped due to exhausted today- 2 min 10 sec; 01/17/2023= 200 feet in 2 min 38 sec; 02/02/2023= 160 feet with use of SBQC in 3 min 45 sec prior to stopping. 6/27 430 feet in 40 sec   Time 12   Period Weeks   Status ONGOING  Target Date 04/27/2023   PT LONG TERM GOAL #8  Title Pt will decrease 5TSTS by at least 2 seconds (without UE support)  in order to demonstrate clinically significant improvement in LE strength.   Baseline 08/11/2022= 16.33 sec with B hands on knees; 09/15/2022= 22 sec with B hands on knees (patient reports increased weakness since  around Christmas and just has not bounced back); 11/08/2022= 18.85 without UE Support; 01/17/2023=30.0 sec without UE support- patient reported not having a good day but tried his best. 02/02/2023- 17.74 sec with UE Support; 03/09/2023 =15.82 sec without UE support   Time 12   Period Weeks  Status ONGOING  Target Date 04/27/2023       PT LONG TERM GOAL #9  Title Pt will improve BERG by at least 3 points in order to demonstrate clinically significant improvement in balance.   Baseline 11/08/2022= 33/56; 01/17/2023= 45/56; 02/02/2023=45/56  Time 12   Period Weeks  Status  PROGRESSING   Target Date 04/27/2023       Plan -     Clinical Impression Statement Patient presents with good motivation for today's progress visit. He demo improved mobility with use of 4WW and improved overall LE strength as seen by improved 5x STS test. He continues to have some days where he is weaker but performed well during all testing today. Patient's condition has the potential to improve in response to therapy. Maximum improvement is yet to be obtained. The anticipated improvement is attainable and reasonable in a generally predictable time.Patient will continue to benefit from skilled PT services to address deficits and impairment identified in evaluation in order to maximize independence and safety in basic mobility required for performance of ADL, IADL, and leisure.    Personal Factors and Comorbidities Age;Past/Current Experience;Time since onset of injury/illness/exacerbation;Comorbidity 3+    Comorbidities COPD, History of Cancer, myasthenia gravis, chronic lumbar surgical history    Examination-Activity Limitations Lift;Squat;Bend;Stairs;Stand;Transfers;Probation officer;Bathing;Hygiene/Grooming;Dressing;Toileting;Bed Mobility;Caring for Others;Continence    Examination-Participation Restrictions Yard Work;Church;Cleaning;Driving;Community Activity    Stability/Clinical Decision Making Evolving/Moderate complexity    Rehab Potential Good    Clinical Impairments Affecting Rehab Potential multipe comorbidities    PT Frequency 1-2x / week    PT Duration 12 weeks    PT Treatment/Interventions ADLs/Self Care Home Management;Electrical Stimulation;Moist Heat;Traction;Ultrasound;Gait training;Stair training;Functional mobility training;Therapeutic activities;Therapeutic exercise;Balance training;Neuromuscular re-education;Patient/family education;Manual techniques;Passive range of motion;Dry needling;Joint Manipulations;Cryotherapy;Vestibular;Canalith Repostioning     PT Next Visit Plan Progress LE strengthening, balance training,  and functional endurance.   PT Home Exercise Plan standing strengthening and balance, seated strengthening and coordination.    Consulted and Agree with Plan of Care Patient              Lenda Kelp PT  Physical Therapist- [facility]  03/10/23, 12:00 PM

## 2023-03-14 ENCOUNTER — Ambulatory Visit: Payer: Medicare Other

## 2023-03-21 ENCOUNTER — Ambulatory Visit: Payer: Medicare Other | Attending: Neurosurgery

## 2023-03-21 DIAGNOSIS — R262 Difficulty in walking, not elsewhere classified: Secondary | ICD-10-CM | POA: Diagnosis present

## 2023-03-21 DIAGNOSIS — R269 Unspecified abnormalities of gait and mobility: Secondary | ICD-10-CM | POA: Diagnosis present

## 2023-03-21 DIAGNOSIS — R278 Other lack of coordination: Secondary | ICD-10-CM | POA: Diagnosis present

## 2023-03-21 DIAGNOSIS — M545 Low back pain, unspecified: Secondary | ICD-10-CM | POA: Diagnosis present

## 2023-03-21 DIAGNOSIS — G959 Disease of spinal cord, unspecified: Secondary | ICD-10-CM | POA: Diagnosis present

## 2023-03-21 DIAGNOSIS — G8929 Other chronic pain: Secondary | ICD-10-CM | POA: Diagnosis present

## 2023-03-21 DIAGNOSIS — R2689 Other abnormalities of gait and mobility: Secondary | ICD-10-CM

## 2023-03-21 DIAGNOSIS — M5442 Lumbago with sciatica, left side: Secondary | ICD-10-CM | POA: Diagnosis present

## 2023-03-21 DIAGNOSIS — M6281 Muscle weakness (generalized): Secondary | ICD-10-CM | POA: Diagnosis present

## 2023-03-21 DIAGNOSIS — R2681 Unsteadiness on feet: Secondary | ICD-10-CM

## 2023-03-21 NOTE — Therapy (Signed)
OUTPATIENT PHYSICAL THERAPY TREATMENT NOTE   Patient Name: Francisco [name] MRN: [id number] DOB:06-13-1946, 77 y.o., male Today's Date: 03/21/2023  PCP: Lynnea Ferrier, MD REFERRING PROVIDER: Venetia Night MD   PT End of Session - 03/21/23 1532     Visit Number 71    Number of Visits 88   Dated correct since most recent recert   Date for PT Re-Evaluation 04/27/23    Authorization Type [employer] Medicare    Authorization Time Period 08/11/22-11/03/2022    Progress Note Due on Visit 80    PT Start Time 1529    PT Stop Time 1615    PT Time Calculation (min) 46 min    Equipment Utilized During Treatment Gait belt    Activity Tolerance Patient tolerated treatment well;No increased pain;Patient limited by fatigue    Behavior During Therapy [facility] for tasks assessed/performed                      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   Carotid stenosis    CHF (congestive heart failure) (HCC)    CKD (chronic kidney disease)    CKD (chronic kidney disease) stage 3, GFR 30-59 ml/min (HCC)    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   DDD (degenerative disc disease), cervical    Dysrhythmia    post carotid stent bradycardia; PAF 09/2020   GERD (gastroesophageal reflux disease)    Hypercholesterolemia    Hypertension    Hypothyroidism    pt takes Levothyroxine daily   Lumbosacral spinal stenosis    Myasthenia gravis, adult form (HCC)    PAD (peripheral artery disease) (HCC)    Parkinson disease    Shortness of breath    Lung MD- Dr Joetta Manners   Sleep apnea    do not use CPAP every night   Past Surgical History:  Procedure Laterality Date   ANTERIOR CERVICAL  DECOMP/DISCECTOMY FUSION  07/18/2011   Procedure: ANTERIOR CERVICAL DECOMPRESSION/DISCECTOMY FUSION 2 LEVELS;  Surgeon: Kathaleen Maser Pool;  Location: [facility];  Service: Neurosurgery;  Laterality: N/A;  cervical five-six, cervical six-seven anterior cervical discectomy and fusion   ATRIAL FIBRILLATION ABLATION     BACK SURGERY     in 1985 [facility]   BILATERAL CARPAL TUNNEL RELEASE     01/2020 Right, 04/2020 Left   CARDIAC CATHETERIZATION     2005 at [facility], no stents   CAROTID PTA/STENT INTERVENTION N/A 09/17/2020   Procedure: CAROTID PTA/STENT INTERVENTION;  Surgeon: Annice Needy, MD;  Location: [facility] INVASIVE CV LAB;  Service: Cardiovascular;  Laterality: N/A;   CATARACT EXTRACTION W/PHACO Left 01/06/2020   Procedure: CATARACT EXTRACTION PHACO AND INTRAOCULAR LENS PLACEMENT (IOC) ISTENT INJ LEFT 3.81  00:33.3;  Surgeon: Nevada Crane, MD;  Location: [facility];  Service: Ophthalmology;  Laterality: Left;   CATARACT EXTRACTION W/PHACO Right 02/03/2020   Procedure: CATARACT EXTRACTION PHACO AND INTRAOCULAR LENS PLACEMENT (IOC) RIGHT ISTENT INJ;  Surgeon: Nevada Crane, MD;  Location: [facility];  Service: Ophthalmology;  Laterality: Right;  4.29 0:35.6   COLONOSCOPY     HERNIA  REPAIR Left    inguinal hernia repair in 1985   LUMBAR LAMINECTOMY/DECOMPRESSION MICRODISCECTOMY Left 02/24/2014   Procedure: LUMBAR LAMINECTOMY/DECOMPRESSION MICRODISCECTOMY LUMBAR THREE-FOUR, FOUR-FIVE, LEFT FIVE-SACRAL ONE ;  Surgeon: Temple Pacini, MD;  Location: [facility];  Service: Neurosurgery;  Laterality: Left;  LUMBAR LAMINECTOMY/DECOMPRESSION MICRODISCECTOMY LUMBAR THREE-FOUR, FOUR-FIVE, LEFT FIVE-SACRAL ONE    LUMBAR LAMINECTOMY/DECOMPRESSION MICRODISCECTOMY N/A 05/03/2021   Procedure: Laminectomy and Foraminotomy - L2-L3;  Surgeon: Julio Sicks, MD;  Location: [facility] OR;  Service: Neurosurgery;  Laterality: N/A;  3C   POSTERIOR CERVICAL FUSION/FORAMINOTOMY N/A 08/07/2020   Procedure:  C3-6 POSTERIOR FUSION WITH DECOMPRESSION;  Surgeon: Venetia Night, MD;  Location: [facility] ORS;  Service: Neurosurgery;  Laterality: N/A;   PROSTATECTOMY  04/2013   [facility] Dr Joycelyn Das    Patient Active Problem List   Diagnosis Date Noted   SVT (supraventricular tachycardia) 12/30/2021   Hypothyroidism 12/30/2021   Parkinson's disease 12/30/2021   Elevated troponin 12/30/2021   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   Acquired thrombophilia (HCC) 01/13/2021   History of decompression of median nerve 01/01/2021   Paroxysmal atrial fibrillation (HCC) 10/06/2020   Carotid stenosis, right 09/17/2020   S/P cervical spinal fusion    Leukocytosis    Essential hypertension    Anemia of chronic disease    Postoperative pain    Neuropathic pain    Cervical myelopathy (HCC) 08/07/2020   Preop cardiovascular exam 07/17/2020   SOB (shortness of breath) on exertion 07/17/2020   Leg weakness, bilateral 07/13/2020   Aortic atherosclerosis (HCC) 07/09/2020   Body mass index (BMI) 34.0-34.9, adult 12/25/2019   Myalgia 10/30/2019   Lumbar post-laminectomy syndrome 10/24/2019   PAD (peripheral artery disease) (HCC) 06/06/2019   CKD (chronic kidney disease) stage 3, GFR 30-59 ml/min (HCC) 04/29/2019   B12 deficiency 01/23/2019   Left arm numbness 02/28/2018   Neck pain 02/28/2018   Anemia 02/02/2017   DDD (degenerative disc disease), cervical 02/02/2017   Hypothyroid 02/02/2017   MRSA (methicillin resistant staph aureus) culture positive 02/02/2017   Nocturnal hypoxia 02/02/2017   Senile purpura (HCC) 10/03/2016   Essential hypertension, benign 09/16/2016   Bilateral carotid artery disease (HCC) 09/16/2016   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/18/2016   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   History of prostate cancer 12/21/2015   Left carpal tunnel syndrome 11/11/2015   Kidney stone on left side 07/05/2015   Myasthenia gravis (HCC) 05/26/2015   Radiculitis 01/05/2015    Long-term use of high-risk medication 10/15/2014   Persistent cough 09/10/2014   Pure hypercholesterolemia 07/25/2014   Spinal stenosis, lumbar region, with neurogenic claudication 02/24/2014   Lumbosacral stenosis with neurogenic claudication 02/24/2014   COPD (chronic obstructive pulmonary disease) (HCC) 01/22/2014    REFERRING DIAG: balance disorder   THERAPY DIAG:  Difficulty in walking, not elsewhere classified  Muscle weakness (generalized)  Unsteadiness on feet  Other abnormalities of gait and mobility  Chronic bilateral low back pain, unspecified whether sciatica present  Other lack of coordination  Cervical myelopathy (HCC)  Abnormality of gait and mobility  Chronic left-sided low back pain with left-sided sciatica  Rationale for Evaluation and Treatment Rehabilitation  PERTINENT HISTORY: Dejion Bouton is a 77 year old male referred to [facility] neuro for difficulty with balance. He was also just recently dx in May 2023 with Impingement syndrome of left shoulder (M75.42) Impingement syndrome of left shoulder (primary encounter diagnosis) Left shoulder pain, unspecified chronicity Nontraumatic rupture of left proximal biceps tendon Past medical hx includes Lumbar  laminectomy 04/2021. C3-6 decompression, fusion on 11/26, myasthenia gravis, 2012 ACDF 5/6, 6/7; OSA, hypothyroidism, HTN, COPD, multiple back surgeries, 2021 carpal tunnel release.  PRECAUTIONS: Fall  SUBJECTIVE: Patient reports just not having a good week- left knee buckled on him while trying to get into his car and brother had to help him.     PAIN:  Are you having pain? R arm pain and R back/leg pain   TODAY'S TREATMENT:    03/09/2023    Physical therapy treatment session today consisted of completing assessment of goals and administration of testing as demonstrated and documented in flow sheet, treatment, and goals section of this note. Addition treatments may be found below.    THEREX:    Nustep L1 LE Only x 5 min (total distance= 0.15 mi) - Patient attempted to keep SPM > 50     NMR:   Forward stepping up/over obstacles in // bars x 4 with 1UE Support to none- x 4 down and back   Side stepping up/over obstacles in // bars x 4 with 1UE Support to none- x 4 down and back   Step marching in // bars without UE Support x 25 reps (1 rest break 1/2 way between)  Using top of // bars as goal for knee height with march- Patient reports as really hard- using momentum to swing left LE up due to overall weakness.   Static standing - SLS - with one LE (foot on top of soccer ball) without UE support - static hold up to 20 sec each LE x 5 each Side.   Gait in clinic using SBQC- approx 65 feet  VC for erect posture and increased step length- Patient had to stop due to right LE fatigue.     Pt educated throughout session about proper posture and technique with exercises. Improved exercise technique, movement at target joints, use of target muscles after min to mod verbal, visual, tactile cues.   PATIENT EDUCATION: Education details: Goals, plan, exercise technique, body mechanics, energy conservation techniques  Person educated: Patient Education method: Explanation, Demonstration, Tactile cues, Verbal cues, and Handouts Education comprehension: verbalized understanding, returned demonstration, verbal cues required, and tactile cues required   HOME EXERCISE PROGRAM:  No Updates today  Access Code: [email or web address] URL: [email or web address] Date: 03/01/2022 Prepared by: Precious Bard  Exercises - Seated March  - 1 x daily - 7 x weekly - 2 sets - 10 reps - 5 hold - Seated Long Arc Quad  - 1 x daily - 7 x weekly - 2 sets - 10 reps - 5 hold - Seated Hip Abduction  - 1 x daily - 7 x weekly - 2 sets - 10 reps - 5 hold - Seated Hip Adduction Isometrics with Ball  - 1 x daily - 7 x weekly - 2 sets - 10 reps - 5 hold - Seated Gluteal Sets  - 1 x daily - 7 x weekly - 2 sets -  10 reps - 5 hold - Seated Heel Toe Raises  - 1 x daily - 7 x weekly - 2 sets - 10 reps - 5 hold LE strength and balance         PT Short Term Goals       PT SHORT TERM GOAL #1   Title Pt will be independent with initial HEP in order to improve strength and balance in order to decrease fall risk and improve function at home and work.    Baseline 02/23/2022- Patient reports not  doing much as far as exercise in the home currently. 04/20/2022- Patient reports compliant with HEP and no questions at this time.; 9/7 pt indep   Time 6    Period Weeks    Status Goal met   Target Date 04/06/22              PT Long Term Goals       PT LONG TERM GOAL #1   Title Pt will be independent with final HEP in order to improve strength and balance in order to decrease fall risk and improve function at home and work. 04/20/2022- Patient reports compliant with HEP and no questions at this time.    Baseline 02/23/2022= No formal HEP in place. 9/7: pt performing at least once a day, feels comfortable   Time 12    Period Weeks    Status GOAL MET   Target Date 08/11/2022      PT LONG TERM GOAL #2   Title Pt will decrease 5TSTS by at least 8 seconds in order to demonstrate clinically significant improvement in LE strength.    Baseline 02/23/2022= 31.52 sec with B hands on knees; 04/20/2022= 16.08 sec with B hands on knees   Time 12    Period Days    Status Goal Met   Target Date 05/18/22      PT LONG TERM GOAL #3   Title Pt will increase by at least 0.23 m/s in order to demonstrate clinically significant improvement in community ambulation.    Baseline 02/23/2022= 0.39 m/s using RW; 0.45 m/s; 9/7: 0.37 m/s with QC, 0.74 m/s with RW   Time 12    Period Weeks    Status GOAL MET   Target Date 05/18/22      PT LONG TERM GOAL #4   Title Pt will improve FOTO to target score of 50 to display perceived improvements in ability to complete ADL's.    Baseline 02/23/2022= 47; 9/7: 49; 11/30=54%   Time 12     Period Weeks    Status GOAL MET   Target Date 08/11/2022      PT LONG TERM GOAL #5   Title Pt will decrease TUG to below 20 seconds/decrease in order to demonstrate decreased fall risk.    Baseline 02/23/2022=28.27 sec with RW; 04/20/2022= 20.22 with SBQC; 9/7: 14.4 sec with RW, 18.5 sec with SBQC   Time 12    Period Weeks    Status MET   Target Date 05/18/22    PT LONG TERM GOAL #6  Title Pt will decrease TUG to 14 sec or below with SBQC in order to demonstrate decreased fall risk.   Baseline  9/7: 14.4 sec with RW, 18.5 sec with SBQC; 11/30=16.85 sec without an AD. 09/15/2022= 22.14 sec with SPC; 11/08/2022= 25.17 sec without and AD and 21.22 sec with SPC. 01/31/2023= 22.64 sec without a AD and 24.64 sec with SBQC;  03/09/2023= 22.04 sec with SBQC and 14.88 sec with 1OX.   Time 12   Period Weeks   Status ONGOING  Target Date 04/27/2023   PT LONG TERM GOAL #7  Title Patient will increase six minute walk test distance to >700 ft for improved gait ability and increased ease with community participation.  Baseline 9/7: completes 4 minutes and 444 ft with RW. Test discontinued at 4 minutes due to fatgiue. 08/11/2022= patient ambulated 200 feet yet required 1 seated rest break- due to BLE fatigue- using SBQC: 09/15/2022= Patient ambulated 300 feet in  4 min 20 sec with 1 seated rest break using SBQC. 11/08/2022 = 175 feet- stopped due to exhausted today- 2 min 10 sec; 01/17/2023= 200 feet in 2 min 38 sec; 02/02/2023= 160 feet with use of SBQC in 3 min 45 sec prior to stopping. 6/27 430 feet in 40 sec   Time 12   Period Weeks   Status ONGOING  Target Date 04/27/2023   PT LONG TERM GOAL #8  Title Pt will decrease 5TSTS by at least 2 seconds (without UE support)  in order to demonstrate clinically significant improvement in LE strength.   Baseline 08/11/2022= 16.33 sec with B hands on knees; 09/15/2022= 22 sec with B hands on knees (patient reports increased weakness since around Christmas and just has  not bounced back); 11/08/2022= 18.85 without UE Support; 01/17/2023=30.0 sec without UE support- patient reported not having a good day but tried his best. 02/02/2023- 17.74 sec with UE Support; 03/09/2023 =15.82 sec without UE support   Time 12   Period Weeks  Status ONGOING  Target Date 04/27/2023       PT LONG TERM GOAL #9  Title Pt will improve BERG by at least 3 points in order to demonstrate clinically significant improvement in balance.   Baseline 11/08/2022= 33/56; 01/17/2023= 45/56; 02/02/2023=45/56  Time 12   Period Weeks  Status PROGRESSING   Target Date 04/27/2023       Plan -     Clinical Impression Statement Patient arrived with good motivation- Still struggling with overall standing balance- attempted more SLS and dynamic balance with very minimal or no UE support. He continues to demonstrate increased overall LE muscle weakness and demonstrated more fatigue with practice. He was too fatigued to produce much gait distance at end of session. Encouraged him to continue to work on his home program to maximize gains. Patient will continue to benefit from skilled PT services to address deficits and impairment identified in evaluation in order to maximize independence and safety in basic mobility required for performance of ADL, IADL, and leisure.    Personal Factors and Comorbidities Age;Past/Current Experience;Time since onset of injury/illness/exacerbation;Comorbidity 3+    Comorbidities COPD, History of Cancer, myasthenia gravis, chronic lumbar surgical history    Examination-Activity Limitations Lift;Squat;Bend;Stairs;Stand;Transfers;Probation officer;Bathing;Hygiene/Grooming;Dressing;Toileting;Bed Mobility;Caring for Others;Continence    Examination-Participation Restrictions Yard Work;Church;Cleaning;Driving;Community Activity    Stability/Clinical Decision Making Evolving/Moderate complexity    Rehab Potential Good    Clinical Impairments Affecting Rehab Potential  multipe comorbidities    PT Frequency 1-2x / week    PT Duration 12 weeks    PT Treatment/Interventions ADLs/Self Care Home Management;Electrical Stimulation;Moist Heat;Traction;Ultrasound;Gait training;Stair training;Functional mobility training;Therapeutic activities;Therapeutic exercise;Balance training;Neuromuscular re-education;Patient/family education;Manual techniques;Passive range of motion;Dry needling;Joint Manipulations;Cryotherapy;Vestibular;Canalith Repostioning    PT Next Visit Plan Progress LE strengthening, balance training,  and functional endurance.   PT Home Exercise Plan standing strengthening and balance, seated strengthening and coordination.    Consulted and Agree with Plan of Care Patient              Lenda Kelp PT  Physical Therapist- [facility]  03/21/23, 4:32 PM

## 2023-03-23 ENCOUNTER — Ambulatory Visit: Payer: Medicare Other

## 2023-03-23 DIAGNOSIS — R269 Unspecified abnormalities of gait and mobility: Secondary | ICD-10-CM

## 2023-03-23 DIAGNOSIS — M545 Low back pain, unspecified: Secondary | ICD-10-CM

## 2023-03-23 DIAGNOSIS — G8929 Other chronic pain: Secondary | ICD-10-CM

## 2023-03-23 DIAGNOSIS — R2689 Other abnormalities of gait and mobility: Secondary | ICD-10-CM

## 2023-03-23 DIAGNOSIS — M6281 Muscle weakness (generalized): Secondary | ICD-10-CM

## 2023-03-23 DIAGNOSIS — R2681 Unsteadiness on feet: Secondary | ICD-10-CM

## 2023-03-23 DIAGNOSIS — G959 Disease of spinal cord, unspecified: Secondary | ICD-10-CM

## 2023-03-23 DIAGNOSIS — R278 Other lack of coordination: Secondary | ICD-10-CM

## 2023-03-23 DIAGNOSIS — R262 Difficulty in walking, not elsewhere classified: Secondary | ICD-10-CM | POA: Diagnosis not present

## 2023-03-23 NOTE — Therapy (Signed)
OUTPATIENT PHYSICAL THERAPY TREATMENT NOTE   Patient Name: Francisco [name] MRN: [id number] DOB:[birth year], 77 y.o., male Today's Date: 04/04/2023  PCP: Lynnea Ferrier, MD REFERRING PROVIDER: Venetia Night MD    PT End of Session - 03/23/23 1528     Visit Number 72    Number of Visits 88   Dated correct since most recent recert   Date for PT Re-Evaluation 04/27/23    Authorization Type [employer] Medicare    Authorization Time Period 08/11/22-11/03/2022    Progress Note Due on Visit 80    PT Start Time 1528    PT Stop Time 1613   PT Time Calculation 45 min   Equipment Utilized During Treatment Gait belt    Activity Tolerance Patient tolerated treatment well;No increased pain;Patient limited by fatigue    Behavior During Therapy [facility] for tasks assessed/performed                       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   Carotid stenosis    CHF (congestive heart failure) (HCC)    CKD (chronic kidney disease)    CKD (chronic kidney disease) stage 3, GFR 30-59 ml/min (HCC)    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   DDD (degenerative disc disease), cervical    Dysrhythmia    post carotid stent bradycardia; PAF 09/2020   GERD (gastroesophageal reflux disease)    Hypercholesterolemia    Hypertension    Hypothyroidism    pt takes Levothyroxine daily   Lumbosacral spinal stenosis    Myasthenia gravis, adult form (HCC)    PAD (peripheral artery disease) (HCC)    Parkinson disease    Shortness of breath    Lung MD- Dr Joetta Manners   Sleep apnea    do not use CPAP every night   Past Surgical History:  Procedure Laterality Date   ANTERIOR CERVICAL  DECOMP/DISCECTOMY FUSION  07/18/2011   Procedure: ANTERIOR CERVICAL DECOMPRESSION/DISCECTOMY FUSION 2 LEVELS;  Surgeon: Kathaleen Maser Pool;  Location: [facility];  Service: Neurosurgery;  Laterality: N/A;  cervical five-six, cervical six-seven anterior cervical discectomy and fusion   ATRIAL FIBRILLATION ABLATION     BACK SURGERY     in 1985 [facility]   BILATERAL CARPAL TUNNEL RELEASE     01/2020 Right, 04/2020 Left   CARDIAC CATHETERIZATION     2005 at [facility], no stents   CAROTID PTA/STENT INTERVENTION N/A 09/17/2020   Procedure: CAROTID PTA/STENT INTERVENTION;  Surgeon: Annice Needy, MD;  Location: [facility] INVASIVE CV LAB;  Service: Cardiovascular;  Laterality: N/A;   CATARACT EXTRACTION W/PHACO Left 01/06/2020   Procedure: CATARACT EXTRACTION PHACO AND INTRAOCULAR LENS PLACEMENT (IOC) ISTENT INJ LEFT 3.81  00:33.3;  Surgeon: Nevada Crane, MD;  Location: [facility];  Service: Ophthalmology;  Laterality: Left;   CATARACT EXTRACTION W/PHACO Right 02/03/2020   Procedure: CATARACT EXTRACTION PHACO AND INTRAOCULAR LENS PLACEMENT (IOC) RIGHT ISTENT INJ;  Surgeon: Nevada Crane, MD;  Location: [facility];  Service: Ophthalmology;  Laterality: Right;  4.29 0:35.6   COLONOSCOPY     HERNIA REPAIR  Left    inguinal hernia repair in 1985   LUMBAR LAMINECTOMY/DECOMPRESSION MICRODISCECTOMY Left 02/24/2014   Procedure: LUMBAR LAMINECTOMY/DECOMPRESSION MICRODISCECTOMY LUMBAR THREE-FOUR, FOUR-FIVE, LEFT FIVE-SACRAL ONE ;  Surgeon: Temple Pacini, MD;  Location: [facility];  Service: Neurosurgery;  Laterality: Left;  LUMBAR LAMINECTOMY/DECOMPRESSION MICRODISCECTOMY LUMBAR THREE-FOUR, FOUR-FIVE, LEFT FIVE-SACRAL ONE    LUMBAR LAMINECTOMY/DECOMPRESSION MICRODISCECTOMY N/A 05/03/2021   Procedure: Laminectomy and Foraminotomy - L2-L3;  Surgeon: Julio Sicks, MD;  Location: [facility] OR;  Service: Neurosurgery;  Laterality: N/A;  3C   POSTERIOR CERVICAL FUSION/FORAMINOTOMY N/A 08/07/2020   Procedure:  C3-6 POSTERIOR FUSION WITH DECOMPRESSION;  Surgeon: Venetia Night, MD;  Location: [facility] ORS;  Service: Neurosurgery;  Laterality: N/A;   PROSTATECTOMY  04/2013   [facility] Dr Joycelyn Das    Patient Active Problem List   Diagnosis Date Noted   SVT (supraventricular tachycardia) 12/30/2021   Hypothyroidism 12/30/2021   Parkinson's disease 12/30/2021   Elevated troponin 12/30/2021   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   Acquired thrombophilia (HCC) 01/13/2021   History of decompression of median nerve 01/01/2021   Paroxysmal atrial fibrillation (HCC) 10/06/2020   Carotid stenosis, right 09/17/2020   S/P cervical spinal fusion    Leukocytosis    Essential hypertension    Anemia of chronic disease    Postoperative pain    Neuropathic pain    Cervical myelopathy (HCC) 08/07/2020   Preop cardiovascular exam 07/17/2020   SOB (shortness of breath) on exertion 07/17/2020   Leg weakness, bilateral 07/13/2020   Aortic atherosclerosis (HCC) 07/09/2020   Body mass index (BMI) 34.0-34.9, adult 12/25/2019   Myalgia 10/30/2019   Lumbar post-laminectomy syndrome 10/24/2019   PAD (peripheral artery disease) (HCC) 06/06/2019   CKD (chronic kidney disease) stage 3, GFR 30-59 ml/min (HCC) 04/29/2019   B12 deficiency 01/23/2019   Left arm numbness 02/28/2018   Neck pain 02/28/2018   Anemia 02/02/2017   DDD (degenerative disc disease), cervical 02/02/2017   Hypothyroid 02/02/2017   MRSA (methicillin resistant staph aureus) culture positive 02/02/2017   Nocturnal hypoxia 02/02/2017   Senile purpura (HCC) 10/03/2016   Essential hypertension, benign 09/16/2016   Bilateral carotid artery disease (HCC) 09/16/2016   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/18/2016   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   History of prostate cancer 12/21/2015   Left carpal tunnel syndrome 11/11/2015   Kidney stone on left side 07/05/2015   Myasthenia gravis (HCC) 05/26/2015   Radiculitis 01/05/2015    Long-term use of high-risk medication 10/15/2014   Persistent cough 09/10/2014   Pure hypercholesterolemia 07/25/2014   Spinal stenosis, lumbar region, with neurogenic claudication 02/24/2014   Lumbosacral stenosis with neurogenic claudication 02/24/2014   COPD (chronic obstructive pulmonary disease) (HCC) 01/22/2014    REFERRING DIAG: balance disorder   THERAPY DIAG:  Difficulty in walking, not elsewhere classified  Muscle weakness (generalized)  Unsteadiness on feet  Other abnormalities of gait and mobility  Chronic bilateral low back pain, unspecified whether sciatica present  Other lack of coordination  Cervical myelopathy (HCC)  Abnormality of gait and mobility  Chronic left-sided low back pain with left-sided sciatica  Rationale for Evaluation and Treatment Rehabilitation  PERTINENT HISTORY: Taron Weingartz is a 77 year old male referred to [facility] neuro for difficulty with balance. He was also just recently dx in May 2023 with Impingement syndrome of left shoulder (M75.42) Impingement syndrome of left shoulder (primary encounter diagnosis) Left shoulder pain, unspecified chronicity Nontraumatic rupture of left proximal biceps tendon Past medical hx includes Lumbar laminectomy  04/2021. C3-6 decompression, fusion on 11/26, myasthenia gravis, 2012 ACDF 5/6, 6/7; OSA, hypothyroidism, HTN, COPD, multiple back surgeries, 2021 carpal tunnel release.  PRECAUTIONS: Fall  SUBJECTIVE:   Patient reports not doing the greatest physically but doing well mentally.    PAIN:  Are you having pain? R arm pain and R back/leg pain   TODAY'S TREATMENT:    04/04/23      THEREX:   Circuit selected activities:  Rd. 1 Seated Hip march AROM- 10 rep alt LE Seated knee ext AROM - 10 reps alt LE Gait with 4WW  100 feet - RPE= 7/10  Rd. 2 Sit to stand (from airex pad on chair) without UE support- 10 reps  Standing hip abd with RTB along support bar x 3 sidesteps each direction x  4 Gait with 4WW 100 feet- RPE- 6/10   Rd 3 Sit to stand without UE support from chair (without airex pad) - Patient reported the last 2 were "really hard"  Standing Calf raises (from 1/2 foam roll) x 12 reps with BUE support Gait with 4WW 100 feet- RPE-7/10  Rd 4 Standing hip ext (AROM) alt LE  Gait with 4WW 100 feet- RPE-7/10     Pt educated throughout session about proper posture and technique with exercises. Improved exercise technique, movement at target joints, use of target muscles after min to mod verbal, visual, tactile cues.   PATIENT EDUCATION: Education details: Goals, plan, exercise technique, body mechanics, energy conservation techniques  Person educated: Patient Education method: Explanation, Demonstration, Tactile cues, Verbal cues, and Handouts Education comprehension: verbalized understanding, returned demonstration, verbal cues required, and tactile cues required   HOME EXERCISE PROGRAM:  No Updates today  Access Code: [email or web address] URL: [email or web address] Date: 03/01/2022 Prepared by: Precious Bard  Exercises - Seated March  - 1 x daily - 7 x weekly - 2 sets - 10 reps - 5 hold - Seated Long Arc Quad  - 1 x daily - 7 x weekly - 2 sets - 10 reps - 5 hold - Seated Hip Abduction  - 1 x daily - 7 x weekly - 2 sets - 10 reps - 5 hold - Seated Hip Adduction Isometrics with Ball  - 1 x daily - 7 x weekly - 2 sets - 10 reps - 5 hold - Seated Gluteal Sets  - 1 x daily - 7 x weekly - 2 sets - 10 reps - 5 hold - Seated Heel Toe Raises  - 1 x daily - 7 x weekly - 2 sets - 10 reps - 5 hold LE strength and balance         PT Short Term Goals       PT SHORT TERM GOAL #1   Title Pt will be independent with initial HEP in order to improve strength and balance in order to decrease fall risk and improve function at home and work.    Baseline 02/23/2022- Patient reports not doing much as far as exercise in the home currently. 04/20/2022- Patient reports  compliant with HEP and no questions at this time.; 9/7 pt indep   Time 6    Period Weeks    Status Goal met   Target Date 04/06/22              PT Long Term Goals       PT LONG TERM GOAL #1   Title Pt will be independent with final HEP in order to improve strength and balance in order to decrease  fall risk and improve function at home and work. 04/20/2022- Patient reports compliant with HEP and no questions at this time.    Baseline 02/23/2022= No formal HEP in place. 9/7: pt performing at least once a day, feels comfortable   Time 12    Period Weeks    Status GOAL MET   Target Date 08/11/2022      PT LONG TERM GOAL #2   Title Pt will decrease 5TSTS by at least 8 seconds in order to demonstrate clinically significant improvement in LE strength.    Baseline 02/23/2022= 31.52 sec with B hands on knees; 04/20/2022= 16.08 sec with B hands on knees   Time 12    Period Days    Status Goal Met   Target Date 05/18/22      PT LONG TERM GOAL #3   Title Pt will increase by at least 0.23 m/s in order to demonstrate clinically significant improvement in community ambulation.    Baseline 02/23/2022= 0.39 m/s using RW; 0.45 m/s; 9/7: 0.37 m/s with QC, 0.74 m/s with RW   Time 12    Period Weeks    Status GOAL MET   Target Date 05/18/22      PT LONG TERM GOAL #4   Title Pt will improve FOTO to target score of 50 to display perceived improvements in ability to complete ADL's.    Baseline 02/23/2022= 47; 9/7: 49; 11/30=54%   Time 12    Period Weeks    Status GOAL MET   Target Date 08/11/2022      PT LONG TERM GOAL #5   Title Pt will decrease TUG to below 20 seconds/decrease in order to demonstrate decreased fall risk.    Baseline 02/23/2022=28.27 sec with RW; 04/20/2022= 20.22 with SBQC; 9/7: 14.4 sec with RW, 18.5 sec with SBQC   Time 12    Period Weeks    Status MET   Target Date 05/18/22    PT LONG TERM GOAL #6  Title Pt will decrease TUG to 14 sec or below with SBQC in order to  demonstrate decreased fall risk.   Baseline  9/7: 14.4 sec with RW, 18.5 sec with SBQC; 11/30=16.85 sec without an AD. 09/15/2022= 22.14 sec with SPC; 11/08/2022= 25.17 sec without and AD and 21.22 sec with SPC. 01/31/2023= 22.64 sec without a AD and 24.64 sec with SBQC;  03/09/2023= 22.04 sec with SBQC and 14.88 sec with 4UJ.   Time 12   Period Weeks   Status ONGOING  Target Date 04/27/2023   PT LONG TERM GOAL #7  Title Patient will increase six minute walk test distance to >700 ft for improved gait ability and increased ease with community participation.  Baseline 9/7: completes 4 minutes and 444 ft with RW. Test discontinued at 4 minutes due to fatgiue. 08/11/2022= patient ambulated 200 feet yet required 1 seated rest break- due to BLE fatigue- using SBQC: 09/15/2022= Patient ambulated 300 feet in 4 min 20 sec with 1 seated rest break using SBQC. 11/08/2022 = 175 feet- stopped due to exhausted today- 2 min 10 sec; 01/17/2023= 200 feet in 2 min 38 sec; 02/02/2023= 160 feet with use of SBQC in 3 min 45 sec prior to stopping. 6/27 430 feet in 40 sec   Time 12   Period Weeks   Status ONGOING  Target Date 04/27/2023   PT LONG TERM GOAL #8  Title Pt will decrease 5TSTS by at least 2 seconds (without UE support)  in order  to demonstrate clinically significant improvement in LE strength.   Baseline 08/11/2022= 16.33 sec with B hands on knees; 09/15/2022= 22 sec with B hands on knees (patient reports increased weakness since around Christmas and just has not bounced back); 11/08/2022= 18.85 without UE Support; 01/17/2023=30.0 sec without UE support- patient reported not having a good day but tried his best. 02/02/2023- 17.74 sec with UE Support; 03/09/2023 =15.82 sec without UE support   Time 12   Period Weeks  Status ONGOING  Target Date 04/27/2023       PT LONG TERM GOAL #9  Title Pt will improve BERG by at least 3 points in order to demonstrate clinically significant improvement in balance.   Baseline  11/08/2022= 33/56; 01/17/2023= 45/56; 02/02/2023=45/56  Time 12   Period Weeks  Status PROGRESSING   Target Date 04/27/2023       Plan -     Clinical Impression Statement Treatment focused on circuit style workout today and patient able to complete most exercises without significant difficulty- continues to present with fatigue as most limiting factor.  Patient will continue to benefit from skilled PT services to address deficits and impairment identified in evaluation in order to maximize independence and safety in basic mobility required for performance of ADL, IADL, and leisure.    Personal Factors and Comorbidities Age;Past/Current Experience;Time since onset of injury/illness/exacerbation;Comorbidity 3+    Comorbidities COPD, History of Cancer, myasthenia gravis, chronic lumbar surgical history    Examination-Activity Limitations Lift;Squat;Bend;Stairs;Stand;Transfers;Probation officer;Bathing;Hygiene/Grooming;Dressing;Toileting;Bed Mobility;Caring for Others;Continence    Examination-Participation Restrictions Yard Work;Church;Cleaning;Driving;Community Activity    Stability/Clinical Decision Making Evolving/Moderate complexity    Rehab Potential Good    Clinical Impairments Affecting Rehab Potential multipe comorbidities    PT Frequency 1-2x / week    PT Duration 12 weeks    PT Treatment/Interventions ADLs/Self Care Home Management;Electrical Stimulation;Moist Heat;Traction;Ultrasound;Gait training;Stair training;Functional mobility training;Therapeutic activities;Therapeutic exercise;Balance training;Neuromuscular re-education;Patient/family education;Manual techniques;Passive range of motion;Dry needling;Joint Manipulations;Cryotherapy;Vestibular;Canalith Repostioning    PT Next Visit Plan Progress LE strengthening, balance training,  and functional endurance.   PT Home Exercise Plan standing strengthening and balance, seated strengthening and coordination.    Consulted  and Agree with Plan of Care Patient              Lenda Kelp PT  Physical Therapist- [facility]  [facility]  04/04/23, 9:03 AM

## 2023-03-28 ENCOUNTER — Ambulatory Visit: Payer: Medicare Other

## 2023-03-30 ENCOUNTER — Ambulatory Visit: Payer: Medicare Other

## 2023-03-30 DIAGNOSIS — R262 Difficulty in walking, not elsewhere classified: Secondary | ICD-10-CM

## 2023-03-30 DIAGNOSIS — R2681 Unsteadiness on feet: Secondary | ICD-10-CM

## 2023-03-30 DIAGNOSIS — R278 Other lack of coordination: Secondary | ICD-10-CM

## 2023-03-30 DIAGNOSIS — R2689 Other abnormalities of gait and mobility: Secondary | ICD-10-CM

## 2023-03-30 DIAGNOSIS — M6281 Muscle weakness (generalized): Secondary | ICD-10-CM

## 2023-03-30 DIAGNOSIS — M545 Low back pain, unspecified: Secondary | ICD-10-CM

## 2023-03-30 NOTE — Therapy (Signed)
OUTPATIENT PHYSICAL THERAPY TREATMENT    Patient Name: Francisco [name] MRN: [id number] DOB:1946/07/10, 77 y.o., male Today's Date: 03/30/2023  PCP: Lynnea Ferrier, MD REFERRING PROVIDER: Venetia Night MD   PT End of Session - 03/30/23 1538     Visit Number 73    Number of Visits 88    Date for PT Re-Evaluation 04/27/23    Authorization Type [employer] Medicare    Authorization Time Period 02/02/23-04/27/23    Progress Note Due on Visit 80    PT Start Time 1531    PT Stop Time 1611    PT Time Calculation (min) 40 min    Equipment Utilized During Treatment Gait belt    Activity Tolerance Patient tolerated treatment well;Patient limited by fatigue;No increased pain    Behavior During Therapy WFL for tasks assessed/performed                      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   Carotid stenosis    CHF (congestive heart failure) (HCC)    CKD (chronic kidney disease)    CKD (chronic kidney disease) stage 3, GFR 30-59 ml/min (HCC)    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   DDD (degenerative disc disease), cervical    Dysrhythmia    post carotid stent bradycardia; PAF 09/2020   GERD (gastroesophageal reflux disease)    Hypercholesterolemia    Hypertension    Hypothyroidism    pt takes Levothyroxine daily   Lumbosacral spinal stenosis    Myasthenia gravis, adult form (HCC)    PAD (peripheral artery disease) (HCC)    Parkinson disease    Shortness of breath    Lung MD- Dr Joetta Manners   Sleep apnea    do not use CPAP every night   Past Surgical History:  Procedure Laterality Date   ANTERIOR CERVICAL DECOMP/DISCECTOMY FUSION  07/18/2011   Procedure:  ANTERIOR CERVICAL DECOMPRESSION/DISCECTOMY FUSION 2 LEVELS;  Surgeon: Kathaleen Maser Pool;  Location: [facility];  Service: Neurosurgery;  Laterality: N/A;  cervical five-six, cervical six-seven anterior cervical discectomy and fusion   ATRIAL FIBRILLATION ABLATION     BACK SURGERY     in 1985 [facility]   BILATERAL CARPAL TUNNEL RELEASE     01/2020 Right, 04/2020 Left   CARDIAC CATHETERIZATION     2005 at [facility], no stents   CAROTID PTA/STENT INTERVENTION N/A 09/17/2020   Procedure: CAROTID PTA/STENT INTERVENTION;  Surgeon: Annice Needy, MD;  Location: [facility];  Service: Cardiovascular;  Laterality: N/A;   CATARACT EXTRACTION W/PHACO Left 01/06/2020   Procedure: CATARACT EXTRACTION PHACO AND INTRAOCULAR LENS PLACEMENT (IOC) ISTENT INJ LEFT 3.81  00:33.3;  Surgeon: Nevada Crane, MD;  Location: [facility];  Service: Ophthalmology;  Laterality: Left;   CATARACT EXTRACTION W/PHACO Right 02/03/2020   Procedure: CATARACT EXTRACTION PHACO AND INTRAOCULAR LENS PLACEMENT (IOC) RIGHT ISTENT INJ;  Surgeon: Nevada Crane, MD;  Location: [facility];  Service: Ophthalmology;  Laterality: Right;  4.29 0:35.6   COLONOSCOPY     HERNIA REPAIR Left    inguinal hernia  repair in 1985   LUMBAR LAMINECTOMY/DECOMPRESSION MICRODISCECTOMY Left 02/24/2014   Procedure: LUMBAR LAMINECTOMY/DECOMPRESSION MICRODISCECTOMY LUMBAR THREE-FOUR, FOUR-FIVE, LEFT FIVE-SACRAL ONE ;  Surgeon: Temple Pacini, MD;  Location: [facility];  Service: Neurosurgery;  Laterality: Left;  LUMBAR LAMINECTOMY/DECOMPRESSION MICRODISCECTOMY LUMBAR THREE-FOUR, FOUR-FIVE, LEFT FIVE-SACRAL ONE    LUMBAR LAMINECTOMY/DECOMPRESSION MICRODISCECTOMY N/A 05/03/2021   Procedure: Laminectomy and Foraminotomy - L2-L3;  Surgeon: Julio Sicks, MD;  Location: [facility];  Service: Neurosurgery;  Laterality: N/A;  3C   POSTERIOR CERVICAL FUSION/FORAMINOTOMY N/A 08/07/2020   Procedure: C3-6 POSTERIOR FUSION WITH DECOMPRESSION;  Surgeon:  Venetia Night, MD;  Location: [facility] OR[facility];  Service: Neurosurgery;  Laterality: N/A;   PROSTATECTOMY  04/2013   [facility] Dr Joycelyn Das    Patient Active Problem List   Diagnosis Date Noted   SVT (supraventricular tachycardia) 12/30/2021   Hypothyroidism 12/30/2021   Parkinson's disease 12/30/2021   Elevated troponin 12/30/2021   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   Acquired thrombophilia (HCC) 01/13/2021   History of decompression of median nerve 01/01/2021   Paroxysmal atrial fibrillation (HCC) 10/06/2020   Carotid stenosis, right 09/17/2020   S/P cervical spinal fusion    Leukocytosis    Essential hypertension    Anemia of chronic disease    Postoperative pain    Neuropathic pain    Cervical myelopathy (HCC) 08/07/2020   Preop cardiovascular exam 07/17/2020   SOB (shortness of breath) on exertion 07/17/2020   Leg weakness, bilateral 07/13/2020   Aortic atherosclerosis (HCC) 07/09/2020   Body mass index (BMI) 34.0-34.9, adult 12/25/2019   Myalgia 10/30/2019   Lumbar post-laminectomy syndrome 10/24/2019   PAD (peripheral artery disease) (HCC) 06/06/2019   CKD (chronic kidney disease) stage 3, GFR 30-59 ml/min (HCC) 04/29/2019   B12 deficiency 01/23/2019   Left arm numbness 02/28/2018   Neck pain 02/28/2018   Anemia 02/02/2017   DDD (degenerative disc disease), cervical 02/02/2017   Hypothyroid 02/02/2017   MRSA (methicillin resistant staph aureus) culture positive 02/02/2017   Nocturnal hypoxia 02/02/2017   Senile purpura (HCC) 10/03/2016   Essential hypertension, benign 09/16/2016   Bilateral carotid artery disease (HCC) 09/16/2016   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/18/2016   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   History of prostate cancer 12/21/2015   Left carpal tunnel syndrome 11/11/2015   Kidney stone on left side 07/05/2015   Myasthenia gravis (HCC) 05/26/2015   Radiculitis 01/05/2015   Long-term use of high-risk medication 10/15/2014    Persistent cough 09/10/2014   Pure hypercholesterolemia 07/25/2014   Spinal stenosis, lumbar region, with neurogenic claudication 02/24/2014   Lumbosacral stenosis with neurogenic claudication 02/24/2014   COPD (chronic obstructive pulmonary disease) (HCC) 01/22/2014    REFERRING DIAG: balance disorder   THERAPY DIAG:  Difficulty in walking, not elsewhere classified  Muscle weakness (generalized)  Unsteadiness on feet  Other abnormalities of gait and mobility  Chronic bilateral low back pain, unspecified whether sciatica present  Other lack of coordination  Rationale for Evaluation and Treatment Rehabilitation  PERTINENT HISTORY: Arrin Pintor is a 77 year old male referred to O[facility] neuro for difficulty with balance. He was also just recently dx in May 2023 with Impingement syndrome of left shoulder (M75.42) Impingement syndrome of left shoulder (primary encounter diagnosis) Left shoulder pain, unspecified chronicity Nontraumatic rupture of left proximal biceps tendon Past medical hx includes Lumbar laminectomy 04/2021. C3-6 decompression, fusion on 11/26, myasthenia gravis, 2012 ACDF 5/6, 6/7; OSA, hypothyroidism, HTN, COPD, multiple back surgeries, 2021 carpal tunnel release.  PRECAUTIONS:  Fall  SUBJECTIVE: Pt was tired on Tuesday apologized for missing. His energy is better today.   PAIN:  Are you having pain? R arm pain and R back/leg pain   TODAY'S TREATMENT:    03/30/23  -seated marching x20 -seated LAQ x12  -STS from elevated plinth, hands free to ad lib (22" height) 10 -forward /backward AMB stepping 35ft x4  ======================  -seated marching x20 -seated LAQ x12  -STS from elevated plinth, hands free to ad lib (22" height) 10 -forward /backward AMB stepping 95ft x4  -standing toe taps on hedgehod 1x20 QC RUE     Pt educated throughout session about proper posture and technique with exercises. Improved exercise technique, movement at target joints,  use of target muscles after min to mod verbal, visual, tactile cues.   PATIENT EDUCATION: Education details: Goals, plan, exercise technique, body mechanics, energy conservation techniques  Person educated: Patient Education method: Explanation, Demonstration, Tactile cues, Verbal cues, and Handouts Education comprehension: verbalized understanding, returned demonstration, verbal cues required, and tactile cues required   HOME EXERCISE PROGRAM:  No Updates today  Access Code: [email or web address] URL: [email or web address] Date: 03/01/2022 Prepared by: Precious Bard  Exercises - Seated March  - 1 x daily - 7 x weekly - 2 sets - 10 reps - 5 hold - Seated Long Arc Quad  - 1 x daily - 7 x weekly - 2 sets - 10 reps - 5 hold - Seated Hip Abduction  - 1 x daily - 7 x weekly - 2 sets - 10 reps - 5 hold - Seated Hip Adduction Isometrics with Ball  - 1 x daily - 7 x weekly - 2 sets - 10 reps - 5 hold - Seated Gluteal Sets  - 1 x daily - 7 x weekly - 2 sets - 10 reps - 5 hold - Seated Heel Toe Raises  - 1 x daily - 7 x weekly - 2 sets - 10 reps - 5 hold LE strength and balance      PT Short Term Goals       PT SHORT TERM GOAL #1   Title Pt will be independent with initial HEP in order to improve strength and balance in order to decrease fall risk and improve function at home and work.    Baseline 02/23/2022- Patient reports not doing much as far as exercise in the home currently. 04/20/2022- Patient reports compliant with HEP and no questions at this time.; 9/7 pt indep   Time 6    Period Weeks    Status Goal met   Target Date 04/06/22              PT Long Term Goals       PT LONG TERM GOAL #1   Title Pt will be independent with final HEP in order to improve strength and balance in order to decrease fall risk and improve function at home and work. 04/20/2022- Patient reports compliant with HEP and no questions at this time.    Baseline 02/23/2022= No formal HEP in place. 9/7: pt  performing at least once a day, feels comfortable   Time 12    Period Weeks    Status GOAL MET   Target Date 08/11/2022      PT LONG TERM GOAL #2   Title Pt will decrease 5TSTS by at least 8 seconds in order to demonstrate clinically significant improvement in LE strength.    Baseline 02/23/2022= 31.52 sec with B hands on knees;  04/20/2022= 16.08 sec with B hands on knees   Time 12    Period Days    Status Goal Met   Target Date 05/18/22      PT LONG TERM GOAL #3   Title Pt will increase by at least 0.23 m/s in order to demonstrate clinically significant improvement in community ambulation.    Baseline 02/23/2022= 0.39 m/s using RW; 0.45 m/s; 9/7: 0.37 m/s with QC, 0.74 m/s with RW   Time 12    Period Weeks    Status GOAL MET   Target Date 05/18/22      PT LONG TERM GOAL #4   Title Pt will improve FOTO to target score of 50 to display perceived improvements in ability to complete ADL's.    Baseline 02/23/2022= 47; 9/7: 49; 11/30=54%   Time 12    Period Weeks    Status GOAL MET   Target Date 08/11/2022      PT LONG TERM GOAL #5   Title Pt will decrease TUG to below 20 seconds/decrease in order to demonstrate decreased fall risk.    Baseline 02/23/2022=28.27 sec with RW; 04/20/2022= 20.22 with SBQC; 9/7: 14.4 sec with RW, 18.5 sec with SBQC   Time 12    Period Weeks    Status MET   Target Date 05/18/22    PT LONG TERM GOAL #6  Title Pt will decrease TUG to 14 sec or below with SBQC in order to demonstrate decreased fall risk.   Baseline  9/7: 14.4 sec with RW, 18.5 sec with SBQC; 11/30=16.85 sec without an AD. 09/15/2022= 22.14 sec with SPC; 11/08/2022= 25.17 sec without and AD and 21.22 sec with SPC. 01/31/2023= 22.64 sec without a AD and 24.64 sec with SBQC;  03/09/2023= 22.04 sec with SBQC and 14.88 sec with 3YQ.   Time 12   Period Weeks   Status ONGOING  Target Date 04/27/2023   PT LONG TERM GOAL #7  Title Patient will increase six minute walk test distance to >700 ft for  improved gait ability and increased ease with community participation.  Baseline 9/7: completes 4 minutes and 444 ft with RW. Test discontinued at 4 minutes due to fatgiue. 08/11/2022= patient ambulated 200 feet yet required 1 seated rest break- due to BLE fatigue- using SBQC: 09/15/2022= Patient ambulated 300 feet in 4 min 20 sec with 1 seated rest break using SBQC. 11/08/2022 = 175 feet- stopped due to exhausted today- 2 min 10 sec; 01/17/2023= 200 feet in 2 min 38 sec; 02/02/2023= 160 feet with use of SBQC in 3 min 45 sec prior to stopping. 6/27 430 feet in 40 sec   Time 12   Period Weeks   Status ONGOING  Target Date 04/27/2023   PT LONG TERM GOAL #8  Title Pt will decrease 5TSTS by at least 2 seconds (without UE support)  in order to demonstrate clinically significant improvement in LE strength.   Baseline 08/11/2022= 16.33 sec with B hands on knees; 09/15/2022= 22 sec with B hands on knees (patient reports increased weakness since around Christmas and just has not bounced back); 11/08/2022= 18.85 without UE Support; 01/17/2023=30.0 sec without UE support- patient reported not having a good day but tried his best. 02/02/2023- 17.74 sec with UE Support; 03/09/2023 =15.82 sec without UE support   Time 12   Period Weeks  Status ONGOING  Target Date 04/27/2023       PT LONG TERM GOAL #9  Title Pt will improve BERG  by at least 3 points in order to demonstrate clinically significant improvement in balance.   Baseline 11/08/2022= 33/56; 01/17/2023= 45/56; 02/02/2023=45/56  Time 12   Period Weeks  Status PROGRESSING   Target Date 04/27/2023       Plan -     Clinical Impression Statement Pt having better energy today. Paces [name] well to meet demands of activity. Pt has limited tolerance to upright standing activity. More LOB with retro stepping with QC, but improved with cues and practice. Patient will continue to benefit from skilled PT services to address deficits and impairment identified in evaluation  in order to maximize independence and safety in basic mobility required for performance of ADL, IADL, and leisure.    Personal Factors and Comorbidities Age;Past/Current Experience;Time since onset of injury/illness/exacerbation;Comorbidity 3+    Comorbidities COPD, History of Cancer, myasthenia gravis, chronic lumbar surgical history    Examination-Activity Limitations Lift;Squat;Bend;Stairs;Stand;Transfers;Probation officer;Bathing;Hygiene/Grooming;Dressing;Toileting;Bed Mobility;Caring for Others;Continence    Examination-Participation Restrictions Yard Work;Church;Cleaning;Driving;Community Activity    Stability/Clinical Decision Making Evolving/Moderate complexity    Rehab Potential Good    Clinical Impairments Affecting Rehab Potential multipe comorbidities    PT Frequency 1-2x / week    PT Duration 12 weeks    PT Treatment/Interventions ADLs/[name] Care Home Management;Electrical Stimulation;Moist Heat;Traction;Ultrasound;Gait training;Stair training;Functional mobility training;Therapeutic activities;Therapeutic exercise;Balance training;Neuromuscular re-education;Patient/family education;Manual techniques;Passive range of motion;Dry needling;Joint Manipulations;Cryotherapy;Vestibular;Canalith Repostioning    PT Next Visit Plan Progress LE strengthening, balance training,  and functional endurance.   PT Home Exercise Plan standing strengthening and balance, seated strengthening and coordination.    Consulted and Agree with Plan of Care Patient            3:47 PM, 03/30/23 Rosamaria Lints, PT, DPT Physical Therapist - [facility]  Outpatient Physical Therapy- Main Campus [phone]     Rosamaria Lints PT  Physical Therapist- [facility]  03/30/23, 3:47 PM

## 2023-04-04 ENCOUNTER — Ambulatory Visit: Payer: Medicare Other

## 2023-04-04 DIAGNOSIS — R262 Difficulty in walking, not elsewhere classified: Secondary | ICD-10-CM

## 2023-04-04 DIAGNOSIS — M6281 Muscle weakness (generalized): Secondary | ICD-10-CM

## 2023-04-04 DIAGNOSIS — G959 Disease of spinal cord, unspecified: Secondary | ICD-10-CM

## 2023-04-04 DIAGNOSIS — R278 Other lack of coordination: Secondary | ICD-10-CM

## 2023-04-04 DIAGNOSIS — R2681 Unsteadiness on feet: Secondary | ICD-10-CM

## 2023-04-04 DIAGNOSIS — R2689 Other abnormalities of gait and mobility: Secondary | ICD-10-CM

## 2023-04-04 DIAGNOSIS — G8929 Other chronic pain: Secondary | ICD-10-CM

## 2023-04-04 NOTE — Therapy (Signed)
OUTPATIENT PHYSICAL THERAPY TREATMENT    Patient Name: Francisco [name] MRN: [id number] DOB:1946/05/26, 77 y.o., male Today's Date: 04/05/2023  PCP: Lynnea Ferrier, MD REFERRING PROVIDER: Venetia Night MD   PT End of Session - 04/04/23 1543     Visit Number 74    Number of Visits 88    Date for PT Re-Evaluation 04/27/23    Authorization Type [employer] Medicare    Authorization Time Period 02/02/23-04/27/23    Progress Note Due on Visit 80    PT Start Time 1534    PT Stop Time 1613    PT Time Calculation (min) 39 min    Equipment Utilized During Treatment Gait belt    Activity Tolerance Patient tolerated treatment well;Patient limited by fatigue;No increased pain    Behavior During Therapy WFL for tasks assessed/performed                      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   Carotid stenosis    CHF (congestive heart failure) (HCC)    CKD (chronic kidney disease)    CKD (chronic kidney disease) stage 3, GFR 30-59 ml/min (HCC)    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   DDD (degenerative disc disease), cervical    Dysrhythmia    post carotid stent bradycardia; PAF 09/2020   GERD (gastroesophageal reflux disease)    Hypercholesterolemia    Hypertension    Hypothyroidism    pt takes Levothyroxine daily   Lumbosacral spinal stenosis    Myasthenia gravis, adult form (HCC)    PAD (peripheral artery disease) (HCC)    Parkinson disease    Shortness of breath    Lung MD- Dr Joetta Manners   Sleep apnea    do not use CPAP every night   Past Surgical History:  Procedure Laterality Date   ANTERIOR CERVICAL DECOMP/DISCECTOMY FUSION  07/18/2011   Procedure:  ANTERIOR CERVICAL DECOMPRESSION/DISCECTOMY FUSION 2 LEVELS;  Surgeon: Kathaleen Maser Pool;  Location: [facility];  Service: Neurosurgery;  Laterality: N/A;  cervical five-six, cervical six-seven anterior cervical discectomy and fusion   ATRIAL FIBRILLATION ABLATION     BACK SURGERY     in 1985 [facility]   BILATERAL CARPAL TUNNEL RELEASE     01/2020 Right, 04/2020 Left   CARDIAC CATHETERIZATION     2005 at [facility], no stents   CAROTID PTA/STENT INTERVENTION N/A 09/17/2020   Procedure: CAROTID PTA/STENT INTERVENTION;  Surgeon: Annice Needy, MD;  Location: [facility];  Service: Cardiovascular;  Laterality: N/A;   CATARACT EXTRACTION W/PHACO Left 01/06/2020   Procedure: CATARACT EXTRACTION PHACO AND INTRAOCULAR LENS PLACEMENT (IOC) ISTENT INJ LEFT 3.81  00:33.3;  Surgeon: Nevada Crane, MD;  Location: [facility];  Service: Ophthalmology;  Laterality: Left;   CATARACT EXTRACTION W/PHACO Right 02/03/2020   Procedure: CATARACT EXTRACTION PHACO AND INTRAOCULAR LENS PLACEMENT (IOC) RIGHT ISTENT INJ;  Surgeon: Nevada Crane, MD;  Location: [facility];  Service: Ophthalmology;  Laterality: Right;  4.29 0:35.6   COLONOSCOPY     HERNIA REPAIR Left    inguinal hernia  repair in 1985   LUMBAR LAMINECTOMY/DECOMPRESSION MICRODISCECTOMY Left 02/24/2014   Procedure: LUMBAR LAMINECTOMY/DECOMPRESSION MICRODISCECTOMY LUMBAR THREE-FOUR, FOUR-FIVE, LEFT FIVE-SACRAL ONE ;  Surgeon: Temple Pacini, MD;  Location: [facility];  Service: Neurosurgery;  Laterality: Left;  LUMBAR LAMINECTOMY/DECOMPRESSION MICRODISCECTOMY LUMBAR THREE-FOUR, FOUR-FIVE, LEFT FIVE-SACRAL ONE    LUMBAR LAMINECTOMY/DECOMPRESSION MICRODISCECTOMY N/A 05/03/2021   Procedure: Laminectomy and Foraminotomy - L2-L3;  Surgeon: Julio Sicks, MD;  Location: [facility];  Service: Neurosurgery;  Laterality: N/A;  3C   POSTERIOR CERVICAL FUSION/FORAMINOTOMY N/A 08/07/2020   Procedure: C3-6 POSTERIOR FUSION WITH DECOMPRESSION;  Surgeon:  Venetia Night, MD;  Location: [facility] OR[facility];  Service: Neurosurgery;  Laterality: N/A;   PROSTATECTOMY  04/2013   [facility] Dr Joycelyn Das    Patient Active Problem List   Diagnosis Date Noted   SVT (supraventricular tachycardia) 12/30/2021   Hypothyroidism 12/30/2021   Parkinson's disease 12/30/2021   Elevated troponin 12/30/2021   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   Acquired thrombophilia (HCC) 01/13/2021   History of decompression of median nerve 01/01/2021   Paroxysmal atrial fibrillation (HCC) 10/06/2020   Carotid stenosis, right 09/17/2020   S/P cervical spinal fusion    Leukocytosis    Essential hypertension    Anemia of chronic disease    Postoperative pain    Neuropathic pain    Cervical myelopathy (HCC) 08/07/2020   Preop cardiovascular exam 07/17/2020   SOB (shortness of breath) on exertion 07/17/2020   Leg weakness, bilateral 07/13/2020   Aortic atherosclerosis (HCC) 07/09/2020   Body mass index (BMI) 34.0-34.9, adult 12/25/2019   Myalgia 10/30/2019   Lumbar post-laminectomy syndrome 10/24/2019   PAD (peripheral artery disease) (HCC) 06/06/2019   CKD (chronic kidney disease) stage 3, GFR 30-59 ml/min (HCC) 04/29/2019   B12 deficiency 01/23/2019   Left arm numbness 02/28/2018   Neck pain 02/28/2018   Anemia 02/02/2017   DDD (degenerative disc disease), cervical 02/02/2017   Hypothyroid 02/02/2017   MRSA (methicillin resistant staph aureus) culture positive 02/02/2017   Nocturnal hypoxia 02/02/2017   Senile purpura (HCC) 10/03/2016   Essential hypertension, benign 09/16/2016   Bilateral carotid artery disease (HCC) 09/16/2016   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/18/2016   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   History of prostate cancer 12/21/2015   Left carpal tunnel syndrome 11/11/2015   Kidney stone on left side 07/05/2015   Myasthenia gravis (HCC) 05/26/2015   Radiculitis 01/05/2015   Long-term use of high-risk medication 10/15/2014    Persistent cough 09/10/2014   Pure hypercholesterolemia 07/25/2014   Spinal stenosis, lumbar region, with neurogenic claudication 02/24/2014   Lumbosacral stenosis with neurogenic claudication 02/24/2014   COPD (chronic obstructive pulmonary disease) (HCC) 01/22/2014    REFERRING DIAG: balance disorder   THERAPY DIAG:  Difficulty in walking, not elsewhere classified  Muscle weakness (generalized)  Unsteadiness on feet  Other abnormalities of gait and mobility  Chronic bilateral low back pain, unspecified whether sciatica present  Other lack of coordination  Cervical myelopathy (HCC)  Rationale for Evaluation and Treatment Rehabilitation  PERTINENT HISTORY: Polo Mcmartin is a 77 year old male referred to [facility] neuro for difficulty with balance. He was also just recently dx in May 2023 with Impingement syndrome of left shoulder (M75.42) Impingement syndrome of left shoulder (primary encounter diagnosis) Left shoulder pain, unspecified chronicity Nontraumatic rupture of left proximal biceps tendon Past medical hx includes Lumbar laminectomy 04/2021. C3-6 decompression, fusion on 11/26, myasthenia gravis, 2012 ACDF 5/6, 6/7; OSA, hypothyroidism, HTN, COPD, multiple back surgeries, 2021 carpal  tunnel release.  PRECAUTIONS: Fall  SUBJECTIVE: Patient reports doing okay without any significant issues- Denies any falls since last visit.    PAIN:  Are you having pain? R arm pain and R back/leg pain   TODAY'S TREATMENT:    04/04/2023   In // bars with CGA and use of gait belt  Therex:   Large steps forward in // bars - down and back x 3 with BUE support focusing on increased step length and erect posture.  Wide side steps in // bars - down and back x 3  Lunge squat walk - down and back x 3 (patient reports as hard but able to perform)  Sit to stand x 6 without UE Support (patient stopped after 6 due to fatigue)  Step up onto 6" block x 10 reps each LE with UE Support (no left  knee buckling)  Sit to stand x 6 without UE support (VC for nose over toes) -improved with less exertion vs. 1st set  Gait using SBQC approx 120 feet- initially good reciprocal steps and erect posture - but after approx 80 feet- decreased swing with LLE and more forward flexed posture.            Pt educated throughout session about proper posture and technique with exercises. Improved exercise technique, movement at target joints, use of target muscles after min to mod verbal, visual, tactile cues.   PATIENT EDUCATION: Education details: Goals, plan, exercise technique, body mechanics, energy conservation techniques  Person educated: Patient Education method: Explanation, Demonstration, Tactile cues, Verbal cues, and Handouts Education comprehension: verbalized understanding, returned demonstration, verbal cues required, and tactile cues required   HOME EXERCISE PROGRAM:  No Updates today  Access Code: [email or web address] URL: [email or web address] Date: 03/01/2022 Prepared by: Precious Bard  Exercises - Seated March  - 1 x daily - 7 x weekly - 2 sets - 10 reps - 5 hold - Seated Long Arc Quad  - 1 x daily - 7 x weekly - 2 sets - 10 reps - 5 hold - Seated Hip Abduction  - 1 x daily - 7 x weekly - 2 sets - 10 reps - 5 hold - Seated Hip Adduction Isometrics with Ball  - 1 x daily - 7 x weekly - 2 sets - 10 reps - 5 hold - Seated Gluteal Sets  - 1 x daily - 7 x weekly - 2 sets - 10 reps - 5 hold - Seated Heel Toe Raises  - 1 x daily - 7 x weekly - 2 sets - 10 reps - 5 hold LE strength and balance      PT Short Term Goals       PT SHORT TERM GOAL #1   Title Pt will be independent with initial HEP in order to improve strength and balance in order to decrease fall risk and improve function at home and work.    Baseline 02/23/2022- Patient reports not doing much as far as exercise in the home currently. 04/20/2022- Patient reports compliant with HEP and no questions at this  time.; 9/7 pt indep   Time 6    Period Weeks    Status Goal met   Target Date 04/06/22              PT Long Term Goals       PT LONG TERM GOAL #1   Title Pt will be independent with final HEP in order to improve strength and balance in order to decrease fall  risk and improve function at home and work. 04/20/2022- Patient reports compliant with HEP and no questions at this time.    Baseline 02/23/2022= No formal HEP in place. 9/7: pt performing at least once a day, feels comfortable   Time 12    Period Weeks    Status GOAL MET   Target Date 08/11/2022      PT LONG TERM GOAL #2   Title Pt will decrease 5TSTS by at least 8 seconds in order to demonstrate clinically significant improvement in LE strength.    Baseline 02/23/2022= 31.52 sec with B hands on knees; 04/20/2022= 16.08 sec with B hands on knees   Time 12    Period Days    Status Goal Met   Target Date 05/18/22      PT LONG TERM GOAL #3   Title Pt will increase by at least 0.23 m/s in order to demonstrate clinically significant improvement in community ambulation.    Baseline 02/23/2022= 0.39 m/s using RW; 0.45 m/s; 9/7: 0.37 m/s with QC, 0.74 m/s with RW   Time 12    Period Weeks    Status GOAL MET   Target Date 05/18/22      PT LONG TERM GOAL #4   Title Pt will improve FOTO to target score of 50 to display perceived improvements in ability to complete ADL's.    Baseline 02/23/2022= 47; 9/7: 49; 11/30=54%   Time 12    Period Weeks    Status GOAL MET   Target Date 08/11/2022      PT LONG TERM GOAL #5   Title Pt will decrease TUG to below 20 seconds/decrease in order to demonstrate decreased fall risk.    Baseline 02/23/2022=28.27 sec with RW; 04/20/2022= 20.22 with SBQC; 9/7: 14.4 sec with RW, 18.5 sec with SBQC   Time 12    Period Weeks    Status MET   Target Date 05/18/22    PT LONG TERM GOAL #6  Title Pt will decrease TUG to 14 sec or below with SBQC in order to demonstrate decreased fall risk.   Baseline   9/7: 14.4 sec with RW, 18.5 sec with SBQC; 11/30=16.85 sec without an AD. 09/15/2022= 22.14 sec with SPC; 11/08/2022= 25.17 sec without and AD and 21.22 sec with SPC. 01/31/2023= 22.64 sec without a AD and 24.64 sec with SBQC;  03/09/2023= 22.04 sec with SBQC and 14.88 sec with 4UJ.   Time 12   Period Weeks   Status ONGOING  Target Date 04/27/2023   PT LONG TERM GOAL #7  Title Patient will increase six minute walk test distance to >700 ft for improved gait ability and increased ease with community participation.  Baseline 9/7: completes 4 minutes and 444 ft with RW. Test discontinued at 4 minutes due to fatgiue. 08/11/2022= patient ambulated 200 feet yet required 1 seated rest break- due to BLE fatigue- using SBQC: 09/15/2022= Patient ambulated 300 feet in 4 min 20 sec with 1 seated rest break using SBQC. 11/08/2022 = 175 feet- stopped due to exhausted today- 2 min 10 sec; 01/17/2023= 200 feet in 2 min 38 sec; 02/02/2023= 160 feet with use of SBQC in 3 min 45 sec prior to stopping. 6/27 430 feet in 40 sec   Time 12   Period Weeks   Status ONGOING  Target Date 04/27/2023   PT LONG TERM GOAL #8  Title Pt will decrease 5TSTS by at least 2 seconds (without UE support)  in order to  demonstrate clinically significant improvement in LE strength.   Baseline 08/11/2022= 16.33 sec with B hands on knees; 09/15/2022= 22 sec with B hands on knees (patient reports increased weakness since around Christmas and just has not bounced back); 11/08/2022= 18.85 without UE Support; 01/17/2023=30.0 sec without UE support- patient reported not having a good day but tried his best. 02/02/2023- 17.74 sec with UE Support; 03/09/2023 =15.82 sec without UE support   Time 12   Period Weeks  Status ONGOING  Target Date 04/27/2023       PT LONG TERM GOAL #9  Title Pt will improve BERG by at least 3 points in order to demonstrate clinically significant improvement in balance.   Baseline 11/08/2022= 33/56; 01/17/2023= 45/56; 02/02/2023=45/56   Time 12   Period Weeks  Status PROGRESSING   Target Date 04/27/2023       Plan -     Clinical Impression Statement Patient continued with trend of improved functional endurance with standing activities. He also demo improved overall standing posture and ability to complete standing strengthening tasks overall today. Patient will continue to benefit from skilled PT services to address deficits and impairment identified in evaluation in order to maximize independence and safety in basic mobility required for performance of ADL, IADL, and leisure.    Personal Factors and Comorbidities Age;Past/Current Experience;Time since onset of injury/illness/exacerbation;Comorbidity 3+    Comorbidities COPD, History of Cancer, myasthenia gravis, chronic lumbar surgical history    Examination-Activity Limitations Lift;Squat;Bend;Stairs;Stand;Transfers;Probation officer;Bathing;Hygiene/Grooming;Dressing;Toileting;Bed Mobility;Caring for Others;Continence    Examination-Participation Restrictions Yard Work;Church;Cleaning;Driving;Community Activity    Stability/Clinical Decision Making Evolving/Moderate complexity    Rehab Potential Good    Clinical Impairments Affecting Rehab Potential multipe comorbidities    PT Frequency 1-2x / week    PT Duration 12 weeks    PT Treatment/Interventions ADLs/Self Care Home Management;Electrical Stimulation;Moist Heat;Traction;Ultrasound;Gait training;Stair training;Functional mobility training;Therapeutic activities;Therapeutic exercise;Balance training;Neuromuscular re-education;Patient/family education;Manual techniques;Passive range of motion;Dry needling;Joint Manipulations;Cryotherapy;Vestibular;Canalith Repostioning    PT Next Visit Plan Progress LE strengthening, balance training,  and functional endurance.   PT Home Exercise Plan standing strengthening and balance, seated strengthening and coordination.    Consulted and Agree with Plan of Care  Patient            8:19 AM, 04/05/23    Lenda Kelp PT Physical Therapist - [facility]  Outpatient Physical Therapy- [facility] [phone]     04/05/23, 8:19 AM

## 2023-04-06 ENCOUNTER — Ambulatory Visit: Payer: Medicare Other

## 2023-04-11 ENCOUNTER — Ambulatory Visit: Payer: Medicare Other

## 2023-04-18 ENCOUNTER — Ambulatory Visit: Payer: Medicare Other

## 2023-04-24 ENCOUNTER — Other Ambulatory Visit (INDEPENDENT_AMBULATORY_CARE_PROVIDER_SITE_OTHER): Payer: Self-pay | Admitting: Nurse Practitioner

## 2023-04-25 ENCOUNTER — Ambulatory Visit: Payer: Medicare Other | Attending: Neurosurgery

## 2023-04-25 DIAGNOSIS — M6281 Muscle weakness (generalized): Secondary | ICD-10-CM | POA: Diagnosis present

## 2023-04-25 DIAGNOSIS — R262 Difficulty in walking, not elsewhere classified: Secondary | ICD-10-CM | POA: Diagnosis not present

## 2023-04-25 DIAGNOSIS — M545 Low back pain, unspecified: Secondary | ICD-10-CM | POA: Insufficient documentation

## 2023-04-25 DIAGNOSIS — G8929 Other chronic pain: Secondary | ICD-10-CM | POA: Diagnosis present

## 2023-04-25 DIAGNOSIS — G959 Disease of spinal cord, unspecified: Secondary | ICD-10-CM | POA: Diagnosis present

## 2023-04-25 DIAGNOSIS — R2689 Other abnormalities of gait and mobility: Secondary | ICD-10-CM | POA: Diagnosis present

## 2023-04-25 DIAGNOSIS — R2681 Unsteadiness on feet: Secondary | ICD-10-CM | POA: Insufficient documentation

## 2023-04-25 DIAGNOSIS — R278 Other lack of coordination: Secondary | ICD-10-CM | POA: Diagnosis present

## 2023-04-25 NOTE — Therapy (Incomplete)
OUTPATIENT PHYSICAL THERAPY TREATMENT    Patient Name: Francisco [name] MRN: [id number] DOB:Nov 27, 1945, 77 y.o., male Today's Date: 04/26/2023  PCP: Lynnea Ferrier, MD REFERRING PROVIDER: Venetia Night MD   PT End of Session - 04/25/23 1538     Visit Number 75    Number of Visits 88    Date for PT Re-Evaluation 04/27/23    Authorization Type [employer] Medicare    Authorization Time Period 02/02/23-04/27/23    Progress Note Due on Visit 80    PT Start Time 1532    PT Stop Time 1612    PT Time Calculation (min) 40 min    Equipment Utilized During Treatment Gait belt    Activity Tolerance Patient tolerated treatment well;Patient limited by fatigue;No increased pain    Behavior During Therapy WFL for tasks assessed/performed                      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   Carotid stenosis    CHF (congestive heart failure) (HCC)    CKD (chronic kidney disease)    CKD (chronic kidney disease) stage 3, GFR 30-59 ml/min (HCC)    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   DDD (degenerative disc disease), cervical    Dysrhythmia    post carotid stent bradycardia; PAF 09/2020   GERD (gastroesophageal reflux disease)    Hypercholesterolemia    Hypertension    Hypothyroidism    pt takes Levothyroxine daily   Lumbosacral spinal stenosis    Myasthenia gravis, adult form (HCC)    PAD (peripheral artery disease) (HCC)    Parkinson disease    Shortness of breath    Lung MD- Dr Joetta Manners   Sleep apnea    do not use CPAP every night   Past Surgical History:  Procedure Laterality Date   ANTERIOR CERVICAL DECOMP/DISCECTOMY FUSION  07/18/2011   Procedure:  ANTERIOR CERVICAL DECOMPRESSION/DISCECTOMY FUSION 2 LEVELS;  Surgeon: Kathaleen Maser Pool;  Location: [facility];  Service: Neurosurgery;  Laterality: N/A;  cervical five-six, cervical six-seven anterior cervical discectomy and fusion   ATRIAL FIBRILLATION ABLATION     BACK SURGERY     in 1985 [facility]   BILATERAL CARPAL TUNNEL RELEASE     01/2020 Right, 04/2020 Left   CARDIAC CATHETERIZATION     2005 at [facility], no stents   CAROTID PTA/STENT INTERVENTION N/A 09/17/2020   Procedure: CAROTID PTA/STENT INTERVENTION;  Surgeon: Annice Needy, MD;  Location: [facility];  Service: Cardiovascular;  Laterality: N/A;   CATARACT EXTRACTION W/PHACO Left 01/06/2020   Procedure: CATARACT EXTRACTION PHACO AND INTRAOCULAR LENS PLACEMENT (IOC) ISTENT INJ LEFT 3.81  00:33.3;  Surgeon: Nevada Crane, MD;  Location: [facility];  Service: Ophthalmology;  Laterality: Left;   CATARACT EXTRACTION W/PHACO Right 02/03/2020   Procedure: CATARACT EXTRACTION PHACO AND INTRAOCULAR LENS PLACEMENT (IOC) RIGHT ISTENT INJ;  Surgeon: Nevada Crane, MD;  Location: [facility];  Service: Ophthalmology;  Laterality: Right;  4.29 0:35.6   COLONOSCOPY     HERNIA REPAIR Left    inguinal hernia  repair in 1985   LUMBAR LAMINECTOMY/DECOMPRESSION MICRODISCECTOMY Left 02/24/2014   Procedure: LUMBAR LAMINECTOMY/DECOMPRESSION MICRODISCECTOMY LUMBAR THREE-FOUR, FOUR-FIVE, LEFT FIVE-SACRAL ONE ;  Surgeon: Temple Pacini, MD;  Location: [facility];  Service: Neurosurgery;  Laterality: Left;  LUMBAR LAMINECTOMY/DECOMPRESSION MICRODISCECTOMY LUMBAR THREE-FOUR, FOUR-FIVE, LEFT FIVE-SACRAL ONE    LUMBAR LAMINECTOMY/DECOMPRESSION MICRODISCECTOMY N/A 05/03/2021   Procedure: Laminectomy and Foraminotomy - L2-L3;  Surgeon: Julio Sicks, MD;  Location: [facility];  Service: Neurosurgery;  Laterality: N/A;  3C   POSTERIOR CERVICAL FUSION/FORAMINOTOMY N/A 08/07/2020   Procedure: C3-6 POSTERIOR FUSION WITH DECOMPRESSION;  Surgeon:  Venetia Night, MD;  Location: [facility] OR[facility];  Service: Neurosurgery;  Laterality: N/A;   PROSTATECTOMY  04/2013   [facility] Dr Joycelyn Das    Patient Active Problem List   Diagnosis Date Noted   SVT (supraventricular tachycardia) 12/30/2021   Hypothyroidism 12/30/2021   Parkinson's disease 12/30/2021   Elevated troponin 12/30/2021   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   Acquired thrombophilia (HCC) 01/13/2021   History of decompression of median nerve 01/01/2021   Paroxysmal atrial fibrillation (HCC) 10/06/2020   Carotid stenosis, right 09/17/2020   S/P cervical spinal fusion    Leukocytosis    Essential hypertension    Anemia of chronic disease    Postoperative pain    Neuropathic pain    Cervical myelopathy (HCC) 08/07/2020   Preop cardiovascular exam 07/17/2020   SOB (shortness of breath) on exertion 07/17/2020   Leg weakness, bilateral 07/13/2020   Aortic atherosclerosis (HCC) 07/09/2020   Body mass index (BMI) 34.0-34.9, adult 12/25/2019   Myalgia 10/30/2019   Lumbar post-laminectomy syndrome 10/24/2019   PAD (peripheral artery disease) (HCC) 06/06/2019   CKD (chronic kidney disease) stage 3, GFR 30-59 ml/min (HCC) 04/29/2019   B12 deficiency 01/23/2019   Left arm numbness 02/28/2018   Neck pain 02/28/2018   Anemia 02/02/2017   DDD (degenerative disc disease), cervical 02/02/2017   Hypothyroid 02/02/2017   MRSA (methicillin resistant staph aureus) culture positive 02/02/2017   Nocturnal hypoxia 02/02/2017   Senile purpura (HCC) 10/03/2016   Essential hypertension, benign 09/16/2016   Bilateral carotid artery disease (HCC) 09/16/2016   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/18/2016   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   History of prostate cancer 12/21/2015   Left carpal tunnel syndrome 11/11/2015   Kidney stone on left side 07/05/2015   Myasthenia gravis (HCC) 05/26/2015   Radiculitis 01/05/2015   Long-term use of high-risk medication 10/15/2014    Persistent cough 09/10/2014   Pure hypercholesterolemia 07/25/2014   Spinal stenosis, lumbar region, with neurogenic claudication 02/24/2014   Lumbosacral stenosis with neurogenic claudication 02/24/2014   COPD (chronic obstructive pulmonary disease) (HCC) 01/22/2014    REFERRING DIAG: balance disorder   THERAPY DIAG:  Difficulty in walking, not elsewhere classified  Muscle weakness (generalized)  Unsteadiness on feet  Other abnormalities of gait and mobility  Chronic bilateral low back pain, unspecified whether sciatica present  Other lack of coordination  Rationale for Evaluation and Treatment Rehabilitation  PERTINENT HISTORY: Esgar Waymire is a 77 year old male referred to [facility] neuro for difficulty with balance. He was also just recently dx in May 2023 with Impingement syndrome of left shoulder (M75.42) Impingement syndrome of left shoulder (primary encounter diagnosis) Left shoulder pain, unspecified chronicity Nontraumatic rupture of left proximal biceps tendon Past medical hx includes Lumbar laminectomy 04/2021. C3-6 decompression, fusion on 11/26, myasthenia gravis, 2012 ACDF 5/6, 6/7; OSA, hypothyroidism, HTN, COPD, multiple back surgeries, 2021 carpal tunnel release.  PRECAUTIONS:  Fall  SUBJECTIVE: Patient reports doing okay without any significant issues- Denies any falls since last visit.    PAIN:  Are you having pain? R arm pain and R back/leg pain   TODAY'S TREATMENT:    04/25/2023   Physical therapy treatment session today consisted of completing assessment of goals and administration of testing as demonstrated and documented in flow sheet, treatment, and goals section of this note. Addition treatments may be found below.   6 Min Walk Test:  Instructed patient to ambulate as quickly and as safely as possible for 6 minutes using LRAD. Patient was allowed to take standing rest breaks without stopping the test, but if the patient required a sitting rest break  the clock would be stopped and the test would be over.  Results: 500 feet (152.4 meters, Avg speed 0.42 m/s) using a bariatric 4WW with gait belt and CGA. Results indicate that the patient has reduced endurance with ambulation compared to age matched norms.  Age Matched Norms: 34-69 yo M: 37 F: 29, 64-79 yo M: 85 F: 471, 78-89 yo M: 417 F: 392 MDC: 58.21 meters (190.98 feet) or 50 meters (ANPTA Core Set of Outcome Measures for Adults with Neurologic Conditions, 2018)  Pt performed 5 time sit<>stand (5xSTS): 15.77 sec (>15 sec indicates increased fall risk)    10 Meter Walk Test: Patient instructed to walk 10 meters (32.8 ft) as quickly and as safely as possible at their normal speed x2. Time measured from 2 meter mark to 8 meter mark to accommodate ramp-up and ramp-down.  Normal speed 1: 0.38 m/s using SPC Normal speed 2: 0.71 m/s using 4WW  Cut off scores: <0.4 m/s = household Ambulator, 0.4-0.8 m/s = limited community Ambulator, >0.8 m/s = community Ambulator, >1.2 m/s = crossing a street, <1.0 = increased fall risk MCID 0.05 m/s (small), 0.13 m/s (moderate), 0.06 m/s (significant)  (ANPTA Core Set of Outcome Measures for Adults with Neurologic Conditions, 2018)   Therex:  Seated hip march 2 x 12 reps each LE Seated knee ext 2 x 12 reps each LE       Pt educated throughout session about proper posture and technique with exercises. Improved exercise technique, movement at target joints, use of target muscles after min to mod verbal, visual, tactile cues.   PATIENT EDUCATION: Education details: Goals, plan, exercise technique, body mechanics, energy conservation techniques  Person educated: Patient Education method: Explanation, Demonstration, Tactile cues, Verbal cues, and Handouts Education comprehension: verbalized understanding, returned demonstration, verbal cues required, and tactile cues required   HOME EXERCISE PROGRAM:  No Updates today  Access Code: [email or web address] URL:  [email or web address] Date: 03/01/2022 Prepared by: Precious Bard  Exercises - Seated March  - 1 x daily - 7 x weekly - 2 sets - 10 reps - 5 hold - Seated Long Arc Quad  - 1 x daily - 7 x weekly - 2 sets - 10 reps - 5 hold - Seated Hip Abduction  - 1 x daily - 7 x weekly - 2 sets - 10 reps - 5 hold - Seated Hip Adduction Isometrics with Ball  - 1 x daily - 7 x weekly - 2 sets - 10 reps - 5 hold - Seated Gluteal Sets  - 1 x daily - 7 x weekly - 2 sets - 10 reps - 5 hold - Seated Heel Toe Raises  - 1 x daily - 7 x weekly - 2 sets - 10 reps - 5 hold LE strength and balance  PT Short Term Goals       PT SHORT TERM GOAL #1   Title Pt will be independent with initial HEP in order to improve strength and balance in order to decrease fall risk and improve function at home and work.    Baseline 02/23/2022- Patient reports not doing much as far as exercise in the home currently. 04/20/2022- Patient reports compliant with HEP and no questions at this time.; 9/7 pt indep   Time 6    Period Weeks    Status Goal met   Target Date 04/06/22              PT Long Term Goals       PT LONG TERM GOAL #1   Title Pt will be independent with final HEP in order to improve strength and balance in order to decrease fall risk and improve function at home and work. 04/20/2022- Patient reports compliant with HEP and no questions at this time.    Baseline 02/23/2022= No formal HEP in place. 9/7: pt performing at least once a day, feels comfortable   Time 12    Period Weeks    Status GOAL MET   Target Date 08/11/2022      PT LONG TERM GOAL #2   Title Pt will decrease 5TSTS by at least 8 seconds in order to demonstrate clinically significant improvement in LE strength.    Baseline 02/23/2022= 31.52 sec with B hands on knees; 04/20/2022= 16.08 sec with B hands on knees   Time 12    Period Days    Status Goal Met   Target Date 05/18/22      PT LONG TERM GOAL #3   Title Pt will increase  by at least 0.23 m/s in order to demonstrate clinically significant improvement in community ambulation.    Baseline 02/23/2022= 0.39 m/s using RW; 0.45 m/s; 9/7: 0.37 m/s with QC, 0.74 m/s with RW   Time 12    Period Weeks    Status GOAL MET   Target Date 05/18/22      PT LONG TERM GOAL #4   Title Pt will improve FOTO to target score of 50 to display perceived improvements in ability to complete ADL's.    Baseline 02/23/2022= 47; 9/7: 49; 11/30=54%   Time 12    Period Weeks    Status GOAL MET   Target Date 08/11/2022      PT LONG TERM GOAL #5   Title Pt will decrease TUG to below 20 seconds/decrease in order to demonstrate decreased fall risk.    Baseline 02/23/2022=28.27 sec with RW; 04/20/2022= 20.22 with SBQC; 9/7: 14.4 sec with RW, 18.5 sec with SBQC   Time 12    Period Weeks    Status MET   Target Date 05/18/22    PT LONG TERM GOAL #6  Title Pt will decrease TUG to 14 sec or below with SBQC in order to demonstrate decreased fall risk.   Baseline  9/7: 14.4 sec with RW, 18.5 sec with SBQC; 11/30=16.85 sec without an AD. 09/15/2022= 22.14 sec with SPC; 11/08/2022= 25.17 sec without and AD and 21.22 sec with SPC. 01/31/2023= 22.64 sec without a AD and 24.64 sec with SBQC;  03/09/2023= 22.04 sec with SBQC and 14.88 sec with 4UJ.   Time 12   Period Weeks   Status ONGOING  Target Date 04/27/2023   PT LONG TERM GOAL #7  Title Patient will increase six minute walk test distance to >700  ft for improved gait ability and increased ease with community participation.  Baseline 9/7: completes 4 minutes and 444 ft with RW. Test discontinued at 4 minutes due to fatgiue. 08/11/2022= patient ambulated 200 feet yet required 1 seated rest break- due to BLE fatigue- using SBQC: 09/15/2022= Patient ambulated 300 feet in 4 min 20 sec with 1 seated rest break using SBQC. 11/08/2022 = 175 feet- stopped due to exhausted today- 2 min 10 sec; 01/17/2023= 200 feet in 2 min 38 sec; 02/02/2023= 160 feet with use of SBQC in 3  min 45 sec prior to stopping. 6/27 430 feet in 40 sec; 04/25/2023= 6 min complete - 500 feet using 4WW  Time 12   Period Weeks   Status ONGOING  Target Date 04/27/2023   PT LONG TERM GOAL #8  Title Pt will decrease 5TSTS by at least 2 seconds (without UE support)  in order to demonstrate clinically significant improvement in LE strength.   Baseline 08/11/2022= 16.33 sec with B hands on knees; 09/15/2022= 22 sec with B hands on knees (patient reports increased weakness since around Christmas and just has not bounced back); 11/08/2022= 18.85 without UE Support; 01/17/2023=30.0 sec without UE support- patient reported not having a good day but tried his best. 02/02/2023- 17.74 sec with UE Support; 03/09/2023 =15.82 sec without UE support; 04/25/2023= 15.77 sec  Time 12   Period Weeks  Status ONGOING  Target Date 04/27/2023       PT LONG TERM GOAL #9  Title Pt will improve BERG by at least 3 points in order to demonstrate clinically significant improvement in balance.   Baseline 11/08/2022= 33/56; 01/17/2023= 45/56; 02/02/2023=45/56  Time 12   Period Weeks  Status PROGRESSING   Target Date 04/27/2023       Plan -     Clinical Impression Statement Patient performed well overall with some testing today. He was able to demo improved 6 min walk endurance- completing  the time today as previously unable. He presented with slight improvement with 5x STS yet still functionally weak and at increased risk of falling.  Patient will continue to benefit from skilled PT services to address deficits and impairment identified in evaluation in order to maximize independence and safety in basic mobility required for performance of ADL, IADL, and leisure.    Personal Factors and Comorbidities Age;Past/Current Experience;Time since onset of injury/illness/exacerbation;Comorbidity 3+    Comorbidities COPD, History of Cancer, myasthenia gravis, chronic lumbar surgical history    Examination-Activity Limitations  Lift;Squat;Bend;Stairs;Stand;Transfers;Probation officer;Bathing;Hygiene/Grooming;Dressing;Toileting;Bed Mobility;Caring for Others;Continence    Examination-Participation Restrictions Yard Work;Church;Cleaning;Driving;Community Activity    Stability/Clinical Decision Making Evolving/Moderate complexity    Rehab Potential Good    Clinical Impairments Affecting Rehab Potential multipe comorbidities    PT Frequency 1-2x / week    PT Duration 12 weeks    PT Treatment/Interventions ADLs/Self Care Home Management;Electrical Stimulation;Moist Heat;Traction;Ultrasound;Gait training;Stair training;Functional mobility training;Therapeutic activities;Therapeutic exercise;Balance training;Neuromuscular re-education;Patient/family education;Manual techniques;Passive range of motion;Dry needling;Joint Manipulations;Cryotherapy;Vestibular;Canalith Repostioning    PT Next Visit Plan Progress LE strengthening, balance training,  and functional endurance.   PT Home Exercise Plan standing strengthening and balance, seated strengthening and coordination.    Consulted and Agree with Plan of Care Patient            9:54 PM, 04/26/23    Lenda Kelp PT Physical Therapist - [facility]  Outpatient Physical Therapy- [facility] [phone]     04/26/23, 9:54 PM

## 2023-04-28 ENCOUNTER — Other Ambulatory Visit (INDEPENDENT_AMBULATORY_CARE_PROVIDER_SITE_OTHER): Payer: Self-pay | Admitting: Nurse Practitioner

## 2023-05-02 ENCOUNTER — Ambulatory Visit: Payer: Medicare Other

## 2023-05-02 DIAGNOSIS — R2681 Unsteadiness on feet: Secondary | ICD-10-CM

## 2023-05-02 DIAGNOSIS — R278 Other lack of coordination: Secondary | ICD-10-CM

## 2023-05-02 DIAGNOSIS — R262 Difficulty in walking, not elsewhere classified: Secondary | ICD-10-CM

## 2023-05-02 DIAGNOSIS — M6281 Muscle weakness (generalized): Secondary | ICD-10-CM

## 2023-05-02 DIAGNOSIS — G959 Disease of spinal cord, unspecified: Secondary | ICD-10-CM

## 2023-05-02 DIAGNOSIS — M545 Low back pain, unspecified: Secondary | ICD-10-CM

## 2023-05-02 DIAGNOSIS — R2689 Other abnormalities of gait and mobility: Secondary | ICD-10-CM

## 2023-05-02 NOTE — Therapy (Signed)
OUTPATIENT PHYSICAL THERAPY TREATMENT/RECERT   Patient Name: Francisco [name] MRN: [id number] DOB:04/16/1946, 77 y.o., male Today's Date: 05/02/2023  PCP: Lynnea Ferrier, MD REFERRING PROVIDER: Venetia Night MD   PT End of Session - 05/02/23 1712     Visit Number 76    Number of Visits 100    Date for PT Re-Evaluation 07/25/23    Authorization Type [employer] Medicare    Authorization Time Period --    Progress Note Due on Visit 80    PT Start Time 1446    PT Stop Time 1529    PT Time Calculation (min) 43 min    Equipment Utilized During Treatment Gait belt    Activity Tolerance Patient tolerated treatment well;Patient limited by fatigue;No increased pain    Behavior During Therapy WFL for tasks assessed/performed                       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   Carotid stenosis    CHF (congestive heart failure) (HCC)    CKD (chronic kidney disease)    CKD (chronic kidney disease) stage 3, GFR 30-59 ml/min (HCC)    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   DDD (degenerative disc disease), cervical    Dysrhythmia    post carotid stent bradycardia; PAF 09/2020   GERD (gastroesophageal reflux disease)    Hypercholesterolemia    Hypertension    Hypothyroidism    pt takes Levothyroxine daily   Lumbosacral spinal stenosis    Myasthenia gravis, adult form (HCC)    PAD (peripheral artery disease) (HCC)    Parkinson disease    Shortness of breath    Lung MD- Dr Joetta Manners   Sleep apnea    do not use CPAP every night   Past Surgical History:  Procedure Laterality Date   ANTERIOR CERVICAL DECOMP/DISCECTOMY FUSION  07/18/2011   Procedure: ANTERIOR  CERVICAL DECOMPRESSION/DISCECTOMY FUSION 2 LEVELS;  Surgeon: Kathaleen Maser Pool;  Location: [facility];  Service: Neurosurgery;  Laterality: N/A;  cervical five-six, cervical six-seven anterior cervical discectomy and fusion   ATRIAL FIBRILLATION ABLATION     BACK SURGERY     in 1985 [facility]   BILATERAL CARPAL TUNNEL RELEASE     01/2020 Right, 04/2020 Left   CARDIAC CATHETERIZATION     2005 at [facility], no stents   CAROTID PTA/STENT INTERVENTION N/A 09/17/2020   Procedure: CAROTID PTA/STENT INTERVENTION;  Surgeon: Annice Needy, MD;  Location: [facility] IN[facility]IVE [facility];  Service: Cardiovascular;  Laterality: N/A;   CATARACT EXTRACTION W/PHACO Left 01/06/2020   Procedure: CATARACT EXTRACTION PHACO AND INTRAOCULAR LENS PLACEMENT (IOC) ISTENT INJ LEFT 3.81  00:33.3;  Surgeon: Nevada Crane, MD;  Location: [facility];  Service: Ophthalmology;  Laterality: Left;   CATARACT EXTRACTION W/PHACO Right 02/03/2020   Procedure: CATARACT EXTRACTION PHACO AND INTRAOCULAR LENS PLACEMENT (IOC) RIGHT ISTENT INJ;  Surgeon: Nevada Crane, MD;  Location: [facility];  Service: Ophthalmology;  Laterality: Right;  4.29 0:35.6   COLONOSCOPY     HERNIA REPAIR Left    inguinal hernia  repair in 1985   LUMBAR LAMINECTOMY/DECOMPRESSION MICRODISCECTOMY Left 02/24/2014   Procedure: LUMBAR LAMINECTOMY/DECOMPRESSION MICRODISCECTOMY LUMBAR THREE-FOUR, FOUR-FIVE, LEFT FIVE-SACRAL ONE ;  Surgeon: Temple Pacini, MD;  Location: [facility];  Service: Neurosurgery;  Laterality: Left;  LUMBAR LAMINECTOMY/DECOMPRESSION MICRODISCECTOMY LUMBAR THREE-FOUR, FOUR-FIVE, LEFT FIVE-SACRAL ONE    LUMBAR LAMINECTOMY/DECOMPRESSION MICRODISCECTOMY N/A 05/03/2021   Procedure: Laminectomy and Foraminotomy - L2-L3;  Surgeon: Julio Sicks, MD;  Location: [facility];  Service: Neurosurgery;  Laterality: N/A;  3C   POSTERIOR CERVICAL FUSION/FORAMINOTOMY N/A 08/07/2020   Procedure: C3-6 POSTERIOR FUSION WITH DECOMPRESSION;  Surgeon:  Venetia Night, MD;  Location: [facility] ORS;  Service: Neurosurgery;  Laterality: N/A;   PROSTATECTOMY  04/2013   [facility] Dr Joycelyn Das    Patient Active Problem List   Diagnosis Date Noted   SVT (supraventricular tachycardia) 12/30/2021   Hypothyroidism 12/30/2021   Parkinson's disease 12/30/2021   Elevated troponin 12/30/2021   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   Acquired thrombophilia (HCC) 01/13/2021   History of decompression of median nerve 01/01/2021   Paroxysmal atrial fibrillation (HCC) 10/06/2020   Carotid stenosis, right 09/17/2020   S/P cervical spinal fusion    Leukocytosis    Essential hypertension    Anemia of chronic disease    Postoperative pain    Neuropathic pain    Cervical myelopathy (HCC) 08/07/2020   Preop cardiovascular exam 07/17/2020   SOB (shortness of breath) on exertion 07/17/2020   Leg weakness, bilateral 07/13/2020   Aortic atherosclerosis (HCC) 07/09/2020   Body mass index (BMI) 34.0-34.9, adult 12/25/2019   Myalgia 10/30/2019   Lumbar post-laminectomy syndrome 10/24/2019   PAD (peripheral artery disease) (HCC) 06/06/2019   CKD (chronic kidney disease) stage 3, GFR 30-59 ml/min (HCC) 04/29/2019   B12 deficiency 01/23/2019   Left arm numbness 02/28/2018   Neck pain 02/28/2018   Anemia 02/02/2017   DDD (degenerative disc disease), cervical 02/02/2017   Hypothyroid 02/02/2017   MRSA (methicillin resistant staph aureus) culture positive 02/02/2017   Nocturnal hypoxia 02/02/2017   Senile purpura (HCC) 10/03/2016   Essential hypertension, benign 09/16/2016   Bilateral carotid artery disease (HCC) 09/16/2016   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/18/2016   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   History of prostate cancer 12/21/2015   Left carpal tunnel syndrome 11/11/2015   Kidney stone on left side 07/05/2015   Myasthenia gravis (HCC) 05/26/2015   Radiculitis 01/05/2015   Long-term use of high-risk medication 10/15/2014    Persistent cough 09/10/2014   Pure hypercholesterolemia 07/25/2014   Spinal stenosis, lumbar region, with neurogenic claudication 02/24/2014   Lumbosacral stenosis with neurogenic claudication 02/24/2014   COPD (chronic obstructive pulmonary disease) (HCC) 01/22/2014    REFERRING DIAG: balance disorder   THERAPY DIAG:  Difficulty in walking, not elsewhere classified  Muscle weakness (generalized)  Unsteadiness on feet  Other abnormalities of gait and mobility  Chronic bilateral low back pain, unspecified whether sciatica present  Other lack of coordination  Cervical myelopathy (HCC)  Rationale for Evaluation and Treatment Rehabilitation  PERTINENT HISTORY: Francisco [name] is a 77 year old male referred to [facility] neuro for difficulty with balance. He was also just recently dx in May 2023 with Impingement syndrome of left shoulder (M75.42) Impingement syndrome of left shoulder (primary encounter diagnosis) Left shoulder pain, unspecified chronicity Nontraumatic rupture of left proximal biceps tendon Past medical hx includes Lumbar laminectomy 04/2021. C3-6 decompression, fusion on 11/26, myasthenia gravis, 2012 ACDF 5/6, 6/7; OSA, hypothyroidism, HTN, COPD, multiple back surgeries, 2021 carpal  tunnel release.  PRECAUTIONS: Fall  SUBJECTIVE: Patient reports doing okay without any significant issues- Denies any falls since last visit.    PAIN:  Are you having pain? R arm pain and R back/leg pain   TODAY'S TREATMENT:    05/02/2023   Physical therapy treatment session today consisted of completing assessment of goals and administration of testing as demonstrated and documented in flow sheet, treatment, and goals section of this note. Addition treatments may be found below.   PT instructed pt in TUG: 14.22 sec (average of 3 trials; >13.5 sec indicates increased fall risk)  BERG:   Patient demonstrated increased fall risk noted by score of 45/56 on the Berg Balance Scale.   <45/56 = fall risk, <42/56 = predictive of recurrent falls, <40/56 = 100% fall risk  >41 = independent, 21-40 = assistive device, 0-20 = wheelchair level  MDC 6.9 (4 pts 45-56, 5 pts 35-44, 7 pts 25-34) (ANPTA Core Set of Outcome Measures for Adults with Neurologic Conditions, 2018)    Therex: Step up x 12 reps each LE Seated hip abd up/over 1/2 foam roll x 12 reps each LE      Pt educated throughout session about proper posture and technique with exercises. Improved exercise technique, movement at target joints, use of target muscles after min to mod verbal, visual, tactile cues.   PATIENT EDUCATION: Education details: Goals, plan, exercise technique, body mechanics, energy conservation techniques  Person educated: Patient Education method: Explanation, Demonstration, Tactile cues, Verbal cues, and Handouts Education comprehension: verbalized understanding, returned demonstration, verbal cues required, and tactile cues required   HOME EXERCISE PROGRAM:  No Updates today  Access Code: [email or web address] URL: [email or web address] Date: 03/01/2022 Prepared by: Precious Bard  Exercises - Seated March  - 1 x daily - 7 x weekly - 2 sets - 10 reps - 5 hold - Seated Long Arc Quad  - 1 x daily - 7 x weekly - 2 sets - 10 reps - 5 hold - Seated Hip Abduction  - 1 x daily - 7 x weekly - 2 sets - 10 reps - 5 hold - Seated Hip Adduction Isometrics with Ball  - 1 x daily - 7 x weekly - 2 sets - 10 reps - 5 hold - Seated Gluteal Sets  - 1 x daily - 7 x weekly - 2 sets - 10 reps - 5 hold - Seated Heel Toe Raises  - 1 x daily - 7 x weekly - 2 sets - 10 reps - 5 hold LE strength and balance      PT Short Term Goals       PT SHORT TERM GOAL #1   Title Pt will be independent with initial HEP in order to improve strength and balance in order to decrease fall risk and improve function at home and work.    Baseline 02/23/2022- Patient reports not doing much as far as exercise in the home  currently. 04/20/2022- Patient reports compliant with HEP and no questions at this time.; 9/7 pt indep   Time 6    Period Weeks    Status Goal met   Target Date 04/06/22              PT Long Term Goals       PT LONG TERM GOAL #1   Title Pt will be independent with final HEP in order to improve strength and balance in order to decrease fall risk and improve function at home and work. 04/20/2022- Patient  reports compliant with HEP and no questions at this time.    Baseline 02/23/2022= No formal HEP in place. 9/7: pt performing at least once a day, feels comfortable   Time 12    Period Weeks    Status GOAL MET   Target Date 08/11/2022      PT LONG TERM GOAL #2   Title Pt will decrease 5TSTS by at least 8 seconds in order to demonstrate clinically significant improvement in LE strength.    Baseline 02/23/2022= 31.52 sec with B hands on knees; 04/20/2022= 16.08 sec with B hands on knees   Time 12    Period Days    Status Goal Met   Target Date 05/18/22      PT LONG TERM GOAL #3   Title Pt will increase by at least 0.23 m/s in order to demonstrate clinically significant improvement in community ambulation.    Baseline 02/23/2022= 0.39 m/s using RW; 0.45 m/s; 9/7: 0.37 m/s with QC, 0.74 m/s with RW   Time 12    Period Weeks    Status GOAL MET   Target Date 05/18/22      PT LONG TERM GOAL #4   Title Pt will improve FOTO to target score of 50 to display perceived improvements in ability to complete ADL's.    Baseline 02/23/2022= 47; 9/7: 49; 11/30=54%   Time 12    Period Weeks    Status GOAL MET   Target Date 08/11/2022      PT LONG TERM GOAL #5   Title Pt will decrease TUG to below 20 seconds/decrease in order to demonstrate decreased fall risk.    Baseline 02/23/2022=28.27 sec with RW; 04/20/2022= 20.22 with SBQC; 9/7: 14.4 sec with RW, 18.5 sec with SBQC   Time 12    Period Weeks    Status MET   Target Date 05/18/22    PT LONG TERM GOAL #6  Title Pt will decrease TUG to 14  sec or below with SBQC in order to demonstrate decreased fall risk.   Baseline  9/7: 14.4 sec with RW, 18.5 sec with SBQC; 11/30=16.85 sec without an AD. 09/15/2022= 22.14 sec with SPC; 11/08/2022= 25.17 sec without and AD and 21.22 sec with SPC. 01/31/2023= 22.64 sec without a AD and 24.64 sec with SBQC;  03/09/2023= 22.04 sec with SBQC and 14.88 sec with 4WW.  05/02/2023= 20.46 sec with SBQC and 14.22 sec  Time 12   Period Weeks   Status ONGOING  Target Date 07/25/2023   PT LONG TERM GOAL #7  Title Patient will increase six minute walk test distance to >700 ft for improved gait ability and increased ease with community participation.  Baseline 9/7: completes 4 minutes and 444 ft with RW. Test discontinued at 4 minutes due to fatgiue. 08/11/2022= patient ambulated 200 feet yet required 1 seated rest break- due to BLE fatigue- using SBQC: 09/15/2022= Patient ambulated 300 feet in 4 min 20 sec with 1 seated rest break using SBQC. 11/08/2022 = 175 feet- stopped due to exhausted today- 2 min 10 sec; 01/17/2023= 200 feet in 2 min 38 sec; 02/02/2023= 160 feet with use of SBQC in 3 min 45 sec prior to stopping. 6/27 430 feet in 40 sec; 04/25/2023= 6 min complete - 500 feet using 4WW  Time 12   Period Weeks   Status ONGOING  Target Date 07/25/2023   PT LONG TERM GOAL #8  Title Pt will decrease 5TSTS by at least 2 seconds (without  UE support)  in order to demonstrate clinically significant improvement in LE strength.   Baseline 08/11/2022= 16.33 sec with B hands on knees; 09/15/2022= 22 sec with B hands on knees (patient reports increased weakness since around Christmas and just has not bounced back); 11/08/2022= 18.85 without UE Support; 01/17/2023=30.0 sec without UE support- patient reported not having a good day but tried his best. 02/02/2023- 17.74 sec with UE Support; 03/09/2023 =15.82 sec without UE support; 04/25/2023= 15.77 sec  Time 12   Period Weeks  Status ONGOING  Target Date 07/25/2023       PT LONG  TERM GOAL #9  Title Pt will improve BERG by at least 3 points in order to demonstrate clinically significant improvement in balance.   Baseline 11/08/2022= 33/56; 01/17/2023= 45/56; 02/02/2023=45/56; 05/02/2023=45/56  Time 12   Period Weeks  Status ONGOING  Target Date 07/25/2023       Plan -     Clinical Impression Statement Continued with testing for today's recert visit. Patient did demo more progress with TUG indicating decreased risk of falling but still in fall risk category. He did not improve with BERG since last tested and secondary to low back symptoms and functional weakness it is unclear how much progress he will make. Patient is appropriate for another recert to focus on improving his strength to decrease his fall risk and if no improvement in 4-6 weeks subject to discharge.   Patient will continue to benefit from skilled PT services to address deficits and impairment identified in evaluation in order to maximize independence and safety in basic mobility required for performance of ADL, IADL, and leisure.    Personal Factors and Comorbidities Age;Past/Current Experience;Time since onset of injury/illness/exacerbation;Comorbidity 3+    Comorbidities COPD, History of Cancer, myasthenia gravis, chronic lumbar surgical history    Examination-Activity Limitations Lift;Squat;Bend;Stairs;Stand;Transfers;Probation officer;Bathing;Hygiene/Grooming;Dressing;Toileting;Bed Mobility;Caring for Others;Continence    Examination-Participation Restrictions Yard Work;Church;Cleaning;Driving;Community Activity    Stability/Clinical Decision Making Evolving/Moderate complexity    Rehab Potential Good    Clinical Impairments Affecting Rehab Potential multipe comorbidities    PT Frequency 1-2x / week    PT Duration 12 weeks    PT Treatment/Interventions ADLs/Self Care Home Management;Electrical Stimulation;Moist Heat;Traction;Ultrasound;Gait training;Stair training;Functional mobility  training;Therapeutic activities;Therapeutic exercise;Balance training;Neuromuscular re-education;Patient/family education;Manual techniques;Passive range of motion;Dry needling;Joint Manipulations;Cryotherapy;Vestibular;Canalith Repostioning    PT Next Visit Plan Progress LE strengthening, balance training,  and functional endurance.   PT Home Exercise Plan standing strengthening and balance, seated strengthening and coordination.    Consulted and Agree with Plan of Care Patient            5:42 PM, 05/02/23    Lenda Kelp PT Physical Therapist - [facility]  Outpatient Physical Therapy- [facility] [phone]     05/02/23, 5:42 PM

## 2023-05-09 ENCOUNTER — Ambulatory Visit: Payer: Medicare Other

## 2023-05-13 ENCOUNTER — Other Ambulatory Visit: Payer: Self-pay

## 2023-05-13 ENCOUNTER — Encounter: Payer: Self-pay | Admitting: Emergency Medicine

## 2023-05-13 ENCOUNTER — Emergency Department: Payer: Medicare Other

## 2023-05-13 ENCOUNTER — Emergency Department
Admission: EM | Admit: 2023-05-13 | Discharge: 2023-05-13 | Disposition: A | Discharge status: Home / Self Care | Payer: Medicare Other | Attending: Emergency Medicine | Admitting: Emergency Medicine

## 2023-05-13 DIAGNOSIS — G20C Parkinsonism, unspecified: Secondary | ICD-10-CM | POA: Diagnosis not present

## 2023-05-13 DIAGNOSIS — J449 Chronic obstructive pulmonary disease, unspecified: Secondary | ICD-10-CM | POA: Diagnosis not present

## 2023-05-13 DIAGNOSIS — R1031 Right lower quadrant pain: Secondary | ICD-10-CM

## 2023-05-13 DIAGNOSIS — I4891 Unspecified atrial fibrillation: Secondary | ICD-10-CM | POA: Diagnosis not present

## 2023-05-13 DIAGNOSIS — N189 Chronic kidney disease, unspecified: Secondary | ICD-10-CM | POA: Diagnosis not present

## 2023-05-13 DIAGNOSIS — I509 Heart failure, unspecified: Secondary | ICD-10-CM | POA: Diagnosis not present

## 2023-05-13 DIAGNOSIS — I130 Hypertensive heart and chronic kidney disease with heart failure and stage 1 through stage 4 chronic kidney disease, or unspecified chronic kidney disease: Secondary | ICD-10-CM | POA: Diagnosis not present

## 2023-05-13 LAB — COMPREHENSIVE METABOLIC PANEL
ALT: <5 U/L (ref 0–44)
AST: 12 U/L — ABNORMAL LOW (ref 15–41)
Albumin: 3.7 g/dL (ref 3.5–5.0)
Alkaline Phosphatase: 50 U/L (ref 38–126)
Anion gap: 9 (ref 5–15)
BUN: 24 mg/dL — ABNORMAL HIGH (ref 8–23)
CO2: 29 mmol/L (ref 22–32)
Calcium: 8.6 mg/dL — ABNORMAL LOW (ref 8.9–10.3)
Chloride: 103 mmol/L (ref 98–111)
Creatinine, Ser: 0.98 mg/dL (ref 0.61–1.24)
GFR, Estimated: >60 mL/min (ref >60)
Glucose, Bld: 94 mg/dL (ref 70–99)
Potassium: 3.7 mmol/L (ref 3.5–5.1)
Sodium: 141 mmol/L (ref 135–145)
Total Bilirubin: 0.8 mg/dL (ref 0.3–1.2)
Total Protein: 6.8 g/dL (ref 6.5–8.1)

## 2023-05-13 LAB — URINALYSIS, ROUTINE W REFLEX MICROSCOPIC
Bacteria, UA: NONE SEEN
Bilirubin Urine: NEGATIVE
Glucose, UA: NEGATIVE mg/dL
Hgb urine dipstick: NEGATIVE
Ketones, ur: 15 mg/dL — AB
Leukocytes,Ua: NEGATIVE
Nitrite: NEGATIVE
Protein, ur: NEGATIVE mg/dL
Specific Gravity, Urine: 1.025 (ref 1.005–1.030)
pH: 5.5 (ref 5.0–8.0)

## 2023-05-13 LAB — CBC
HCT: 31.7 % — ABNORMAL LOW (ref 39.0–52.0)
Hemoglobin: 10 g/dL — ABNORMAL LOW (ref 13.0–17.0)
MCH: 35.6 pg — ABNORMAL HIGH (ref 26.0–34.0)
MCHC: 31.5 g/dL (ref 30.0–36.0)
MCV: 112.8 fL — ABNORMAL HIGH (ref 80.0–100.0)
Platelets: 249 10*3/uL (ref 150–400)
RBC: 2.81 MIL/uL — ABNORMAL LOW (ref 4.22–5.81)
RDW: 15.1 % (ref 11.5–15.5)
WBC: 5.2 10*3/uL (ref 4.0–10.5)
nRBC: 0 % (ref 0.0–0.2)

## 2023-05-13 LAB — LIPASE, BLOOD: Lipase: 35 U/L (ref 11–51)

## 2023-05-13 MED ORDER — IOHEXOL 300 MG/ML  SOLN
100.0000 mL | Freq: Once | INTRAMUSCULAR | Status: AC | PRN
  Administered 2023-05-13: 100 mL via INTRAVENOUS

## 2023-05-13 NOTE — ED Notes (Signed)
First Nurse Note: Pt to ED via [facility] from home for abd pain x 2 days, pt reports having pain when he moves certain ways. Pt has not had fever or constipation. Pt denies N/V. PT is in NAD. VSS.

## 2023-05-13 NOTE — ED Provider Notes (Signed)
[facility] Provider Note    Event Date/Time   First MD Initiated Contact with Patient 05/13/23 1502     (approximate)   History   Chief Complaint: Abdominal Pain   HPI  Francisco [name] is a 77 y.o. male with a history of hypertension COPD CHF Parkinson's disease atrial fibrillation CKD who comes to the ED complaining of right lower quadrant abdominal pain, intermittent waxing waning for the past 2 or 3 days.  Worse with movement.  No vomiting or diarrhea, no fever chest pain shortness of breath.  Eating and drinking normally.     Physical Exam   Triage Vital Signs: ED Triage Vitals  Encounter Vitals Group     BP 05/13/23 1406 137/66     Systolic BP Percentile --      Diastolic BP Percentile --      Pulse Rate 05/13/23 1406 73     Resp 05/13/23 1406 18     Temp --      Temp Source 05/13/23 1406 Oral     SpO2 05/13/23 1406 93 %     Weight 05/13/23 1408 233 lb (105.7 kg)     Height 05/13/23 1408 5\' 10"  (1.778 m)     Head Circumference --      Peak Flow --      Pain Score 05/13/23 1408 8     Pain Loc --      Pain Education --      Exclude from Growth Chart --     Most recent vital signs: Vitals:   05/13/23 1406  BP: 137/66  Pulse: 73  Resp: 18  SpO2: 93%    General: Awake, no distress.  CV:  Good peripheral perfusion.  Resp:  Normal effort.  Abd:  No distention.  Soft with right lower quadrant abdominal tenderness, not focal to McBurney's point.    ED Results / Procedures / Treatments   Labs (all labs ordered are listed, but only abnormal results are displayed) Labs Reviewed  COMPREHENSIVE METABOLIC PANEL - Abnormal; Notable for the following components:      Result Value   BUN 24 (*)    Calcium 8.6 (*)    AST 12 (*)    All other components within normal limits  CBC - Abnormal; Notable for the following components:   RBC 2.81 (*)    Hemoglobin 10.0 (*)    HCT 31.7 (*)    MCV 112.8 (*)    MCH 35.6 (*)    All other  components within normal limits  URINALYSIS, ROUTINE W REFLEX MICROSCOPIC - Abnormal; Notable for the following components:   Color, Urine YELLOW (*)    APPearance CLEAR (*)    Ketones, ur 15 (*)    All other components within normal limits  LIPASE, BLOOD     EKG    RADIOLOGY CT abdomen pelvis interpreted by me, normal appendix, no bowel obstruction, no free air.  Radiology report reviewed   PROCEDURES:  Procedures   MEDICATIONS ORDERED IN ED: Medications  iohexol (OMNIPAQUE) 300 MG/ML solution 100 mL (100 mLs Intravenous Contrast Given 05/13/23 1601)     IMPRESSION / MDM / ASSESSMENT AND PLAN / ED COURSE  I reviewed the triage vital signs and the nursing notes.  DDx: Appendicitis, diverticulitis, bowel obstruction, constipation, UTI, ureterolithiasis  Patient's presentation is most consistent with acute presentation with potential threat to life or bodily function.  Patient presents with right lower quadrant abdominal pain and tenderness.  Labs unremarkable, no sign of UTI.  Will obtain CT.    ----------------------------------------- 5:43 PM on 05/13/2023 ----------------------------------------- CT unremarkable.  Patient feeling well.  He does chronically take hydrocodone for pain and reports hard stools, recommend he start Senokot     FINAL CLINICAL IMPRESSION(S) / ED DIAGNOSES   Final diagnoses:  Right lower quadrant abdominal pain     Rx / DC Orders   ED Discharge Orders     None        Note:  This document was prepared using Dragon voice recognition software and may include unintentional dictation errors.   Sharman Cheek, MD 05/13/23 [phone]

## 2023-05-13 NOTE — ED Triage Notes (Signed)
Pt in via POV, reports RLQ abdominal pain x a few days, worsening today.  Reports some nausea, denies vomiting/diarrhea; denies any urinary symptoms.  Vitals WDL, NAD noted at this time.

## 2023-05-16 ENCOUNTER — Ambulatory Visit: Payer: Medicare Other

## 2023-05-23 ENCOUNTER — Other Ambulatory Visit: Payer: Self-pay | Admitting: Student

## 2023-05-23 ENCOUNTER — Ambulatory Visit: Payer: Medicare Other | Attending: Neurosurgery

## 2023-05-23 DIAGNOSIS — R262 Difficulty in walking, not elsewhere classified: Secondary | ICD-10-CM | POA: Diagnosis present

## 2023-05-23 DIAGNOSIS — G8929 Other chronic pain: Secondary | ICD-10-CM | POA: Insufficient documentation

## 2023-05-23 DIAGNOSIS — M545 Low back pain, unspecified: Secondary | ICD-10-CM | POA: Diagnosis present

## 2023-05-23 DIAGNOSIS — G20A1 Parkinson's disease without dyskinesia, without mention of fluctuations: Secondary | ICD-10-CM

## 2023-05-23 DIAGNOSIS — R278 Other lack of coordination: Secondary | ICD-10-CM | POA: Insufficient documentation

## 2023-05-23 DIAGNOSIS — R2681 Unsteadiness on feet: Secondary | ICD-10-CM | POA: Diagnosis present

## 2023-05-23 DIAGNOSIS — M6281 Muscle weakness (generalized): Secondary | ICD-10-CM | POA: Diagnosis present

## 2023-05-23 DIAGNOSIS — R2689 Other abnormalities of gait and mobility: Secondary | ICD-10-CM | POA: Diagnosis present

## 2023-05-23 NOTE — Therapy (Unsigned)
OUTPATIENT PHYSICAL THERAPY TREATMENT/RECERT   Patient Name: Francisco [name] MRN: [id number] DOB:1946/03/01, 77 y.o., male Today's Date: 05/23/2023  PCP: Lynnea Ferrier, MD REFERRING PROVIDER: Venetia Night MD   PT End of Session - 05/23/23 1359     Visit Number 77    Number of Visits 100    Date for PT Re-Evaluation 07/25/23    Authorization Type [employer] Medicare    Progress Note Due on Visit 80    PT Start Time 1400    Equipment Utilized During Treatment Gait belt    Activity Tolerance Patient tolerated treatment well;Patient limited by fatigue;No increased pain    Behavior During Therapy WFL for tasks assessed/performed                       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   Carotid stenosis    CHF (congestive heart failure) (HCC)    CKD (chronic kidney disease)    CKD (chronic kidney disease) stage 3, GFR 30-59 ml/min (HCC)    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   DDD (degenerative disc disease), cervical    Dysrhythmia    post carotid stent bradycardia; PAF 09/2020   GERD (gastroesophageal reflux disease)    Hypercholesterolemia    Hypertension    Hypothyroidism    pt takes Levothyroxine daily   Lumbosacral spinal stenosis    Myasthenia gravis, adult form (HCC)    PAD (peripheral artery disease) (HCC)    Parkinson disease    Shortness of breath    Lung MD- Dr Joetta Manners   Sleep apnea    do not use CPAP every night   Past Surgical History:  Procedure Laterality Date   ANTERIOR CERVICAL DECOMP/DISCECTOMY FUSION  07/18/2011   Procedure: ANTERIOR CERVICAL DECOMPRESSION/DISCECTOMY FUSION 2 LEVELS;  Surgeon: Kathaleen Maser Pool;  Location: [facility];  Service: Neurosurgery;  Laterality: N/A;  cervical five-six, cervical six-seven anterior cervical discectomy and fusion   ATRIAL FIBRILLATION ABLATION     BACK SURGERY     in 1985 [facility]   BILATERAL CARPAL TUNNEL RELEASE     01/2020 Right, 04/2020 Left   CARDIAC CATHETERIZATION     2005 at [facility], no stents   CAROTID PTA/STENT INTERVENTION N/A 09/17/2020   Procedure: CAROTID PTA/STENT INTERVENTION;  Surgeon: Annice Needy, MD;  Location: [facility] INVASIVE CV LAB;  Service: Cardiovascular;  Laterality: N/A;   CATARACT EXTRACTION W/PHACO Left 01/06/2020   Procedure: CATARACT EXTRACTION PHACO AND INTRAOCULAR LENS PLACEMENT (IOC) ISTENT INJ LEFT 3.81  00:33.3;  Surgeon: Nevada Crane, MD;  Location: [facility];  Service: Ophthalmology;  Laterality: Left;   CATARACT EXTRACTION W/PHACO Right 02/03/2020   Procedure: CATARACT EXTRACTION PHACO AND INTRAOCULAR LENS PLACEMENT (IOC) RIGHT ISTENT INJ;  Surgeon: Nevada Crane, MD;  Location: [facility];  Service: Ophthalmology;  Laterality: Right;  4.29 0:35.6   COLONOSCOPY     HERNIA REPAIR Left    inguinal hernia repair in 1985   LUMBAR LAMINECTOMY/DECOMPRESSION MICRODISCECTOMY Left 02/24/2014   Procedure: LUMBAR LAMINECTOMY/DECOMPRESSION MICRODISCECTOMY LUMBAR THREE-FOUR, FOUR-FIVE, LEFT FIVE-SACRAL ONE ;  Surgeon: Temple Pacini, MD;  Location: [facility];  Service: Neurosurgery;  Laterality: Left;  LUMBAR LAMINECTOMY/DECOMPRESSION MICRODISCECTOMY LUMBAR THREE-FOUR, FOUR-FIVE, LEFT FIVE-SACRAL ONE    LUMBAR LAMINECTOMY/DECOMPRESSION MICRODISCECTOMY N/A 05/03/2021   Procedure: Laminectomy and Foraminotomy - L2-L3;  Surgeon: Julio Sicks, MD;  Location: [facility] OR;  Service: Neurosurgery;  Laterality: N/A;  3C   POSTERIOR CERVICAL FUSION/FORAMINOTOMY N/A 08/07/2020   Procedure: C3-6 POSTERIOR FUSION WITH DECOMPRESSION;  Surgeon: Venetia Night, MD;  Location: [facility] ORS;  Service: Neurosurgery;  Laterality: N/A;   PROSTATECTOMY   04/2013   [facility] Dr Joycelyn Das    Patient Active Problem List   Diagnosis Date Noted   SVT (supraventricular tachycardia) 12/30/2021   Hypothyroidism 12/30/2021   Parkinson's disease 12/30/2021   Elevated troponin 12/30/2021   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   Acquired thrombophilia (HCC) 01/13/2021   History of decompression of median nerve 01/01/2021   Paroxysmal atrial fibrillation (HCC) 10/06/2020   Carotid stenosis, right 09/17/2020   S/P cervical spinal fusion    Leukocytosis    Essential hypertension    Anemia of chronic disease    Postoperative pain    Neuropathic pain    Cervical myelopathy (HCC) 08/07/2020   Preop cardiovascular exam 07/17/2020   SOB (shortness of breath) on exertion 07/17/2020   Leg weakness, bilateral 07/13/2020   Aortic atherosclerosis (HCC) 07/09/2020   Body mass index (BMI) 34.0-34.9, adult 12/25/2019   Myalgia 10/30/2019   Lumbar post-laminectomy syndrome 10/24/2019   PAD (peripheral artery disease) (HCC) 06/06/2019   CKD (chronic kidney disease) stage 3, GFR 30-59 ml/min (HCC) 04/29/2019   B12 deficiency 01/23/2019   Left arm numbness 02/28/2018   Neck pain 02/28/2018   Anemia 02/02/2017   DDD (degenerative disc disease), cervical 02/02/2017   Hypothyroid 02/02/2017   MRSA (methicillin resistant staph aureus) culture positive 02/02/2017   Nocturnal hypoxia 02/02/2017   Senile purpura (HCC) 10/03/2016   Essential hypertension, benign 09/16/2016   Bilateral carotid artery disease (HCC) 09/16/2016   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/18/2016   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   History of prostate cancer 12/21/2015   Left carpal tunnel syndrome 11/11/2015   Kidney stone on left side 07/05/2015   Myasthenia gravis (HCC) 05/26/2015   Radiculitis 01/05/2015   Long-term use of high-risk medication 10/15/2014   Persistent cough 09/10/2014   Pure hypercholesterolemia 07/25/2014   Spinal stenosis, lumbar  region, with neurogenic claudication 02/24/2014   Lumbosacral stenosis with neurogenic claudication 02/24/2014   COPD (chronic obstructive pulmonary disease) (HCC) 01/22/2014    REFERRING DIAG: balance disorder   THERAPY DIAG:  Difficulty in walking, not elsewhere classified  Muscle weakness (generalized)  Unsteadiness on feet  Other abnormalities of gait and mobility  Chronic bilateral low back pain, unspecified whether sciatica present  Other lack of coordination  Rationale for Evaluation and Treatment Rehabilitation  PERTINENT HISTORY: Francisco [name] is a 77 year old male referred to OPPT neuro for difficulty with balance. He was also just recently dx in May 2023 with Impingement syndrome of left shoulder (M75.42) Impingement syndrome of left shoulder (primary encounter diagnosis) Left shoulder pain, unspecified chronicity Nontraumatic rupture of left proximal biceps tendon Past medical hx includes Lumbar laminectomy 04/2021. C3-6 decompression, fusion on 11/26, myasthenia gravis, 2012 ACDF 5/6, 6/7; OSA, hypothyroidism, HTN, COPD, multiple back surgeries, 2021 carpal tunnel release.  PRECAUTIONS: Fall  SUBJECTIVE: Patient reports he went into ED at end of August with some right Lower quadrant pain. States no longer having pain.  PAIN:  Are you having pain? R arm pain and R back/leg pain   TODAY'S TREATMENT:    05/23/2023   Therex:   Nustep L0 BUE/LE for mm endurance- 5 min total at 0.16 mi Standing hip march (RTB) 2 x 12 reps Standing hip abd (RTB) 2 x 12 reps Resistive gait with rollator and 3# AW x 160 feet (no shuffling)  Sit to stand with airex pad on seat of chair Step up x 12 reps each LE Seated hip abd up/over 1/2 foam roll x 12 reps each LE      Pt educated throughout session about proper posture and technique with exercises. Improved exercise technique, movement at target joints, use of target muscles after min to mod verbal, visual, tactile cues.    PATIENT EDUCATION: Education details: Goals, plan, exercise technique, body mechanics, energy conservation techniques  Person educated: Patient Education method: Explanation, Demonstration, Tactile cues, Verbal cues, and Handouts Education comprehension: verbalized understanding, returned demonstration, verbal cues required, and tactile cues required   HOME EXERCISE PROGRAM:  No Updates today  Access Code: [email or web address] URL: [email or web address] Date: 03/01/2022 Prepared by: Precious Bard  Exercises - Seated March  - 1 x daily - 7 x weekly - 2 sets - 10 reps - 5 hold - Seated Long Arc Quad  - 1 x daily - 7 x weekly - 2 sets - 10 reps - 5 hold - Seated Hip Abduction  - 1 x daily - 7 x weekly - 2 sets - 10 reps - 5 hold - Seated Hip Adduction Isometrics with Ball  - 1 x daily - 7 x weekly - 2 sets - 10 reps - 5 hold - Seated Gluteal Sets  - 1 x daily - 7 x weekly - 2 sets - 10 reps - 5 hold - Seated Heel Toe Raises  - 1 x daily - 7 x weekly - 2 sets - 10 reps - 5 hold LE strength and balance      PT Short Term Goals       PT SHORT TERM GOAL #1   Title Pt will be independent with initial HEP in order to improve strength and balance in order to decrease fall risk and improve function at home and work.    Baseline 02/23/2022- Patient reports not doing much as far as exercise in the home currently. 04/20/2022- Patient reports compliant with HEP and no questions at this time.; 9/7 pt indep   Time 6    Period Weeks    Status Goal met   Target Date 04/06/22              PT Long Term Goals       PT LONG TERM GOAL #1   Title Pt will be independent with final HEP in order to improve strength and balance in order to decrease fall risk and improve function at home and work. 04/20/2022- Patient reports compliant with HEP and no questions at this time.    Baseline 02/23/2022= No formal HEP in place. 9/7: pt performing at least once a day, feels comfortable   Time 12    Period  Weeks    Status GOAL MET   Target Date 08/11/2022      PT LONG TERM GOAL #2   Title Pt will decrease 5TSTS by at least 8 seconds in order to demonstrate clinically significant improvement in LE strength.    Baseline 02/23/2022= 31.52 sec with B hands on knees; 04/20/2022= 16.08 sec with B  hands on knees   Time 12    Period Days    Status Goal Met   Target Date 05/18/22      PT LONG TERM GOAL #3   Title Pt will increase by at least 0.23 m/s in order to demonstrate clinically significant improvement in community ambulation.    Baseline 02/23/2022= 0.39 m/s using RW; 0.45 m/s; 9/7: 0.37 m/s with QC, 0.74 m/s with RW   Time 12    Period Weeks    Status GOAL MET   Target Date 05/18/22      PT LONG TERM GOAL #4   Title Pt will improve FOTO to target score of 50 to display perceived improvements in ability to complete ADL's.    Baseline 02/23/2022= 47; 9/7: 49; 11/30=54%   Time 12    Period Weeks    Status GOAL MET   Target Date 08/11/2022      PT LONG TERM GOAL #5   Title Pt will decrease TUG to below 20 seconds/decrease in order to demonstrate decreased fall risk.    Baseline 02/23/2022=28.27 sec with RW; 04/20/2022= 20.22 with SBQC; 9/7: 14.4 sec with RW, 18.5 sec with SBQC   Time 12    Period Weeks    Status MET   Target Date 05/18/22    PT LONG TERM GOAL #6  Title Pt will decrease TUG to 14 sec or below with SBQC in order to demonstrate decreased fall risk.   Baseline  9/7: 14.4 sec with RW, 18.5 sec with SBQC; 11/30=16.85 sec without an AD. 09/15/2022= 22.14 sec with SPC; 11/08/2022= 25.17 sec without and AD and 21.22 sec with SPC. 01/31/2023= 22.64 sec without a AD and 24.64 sec with SBQC;  03/09/2023= 22.04 sec with SBQC and 14.88 sec with 4WW.  05/02/2023= 20.46 sec with SBQC and 14.22 sec  Time 12   Period Weeks   Status ONGOING  Target Date 07/25/2023   PT LONG TERM GOAL #7  Title Patient will increase six minute walk test distance to >700 ft for improved gait ability and  increased ease with community participation.  Baseline 9/7: completes 4 minutes and 444 ft with RW. Test discontinued at 4 minutes due to fatgiue. 08/11/2022= patient ambulated 200 feet yet required 1 seated rest break- due to BLE fatigue- using SBQC: 09/15/2022= Patient ambulated 300 feet in 4 min 20 sec with 1 seated rest break using SBQC. 11/08/2022 = 175 feet- stopped due to exhausted today- 2 min 10 sec; 01/17/2023= 200 feet in 2 min 38 sec; 02/02/2023= 160 feet with use of SBQC in 3 min 45 sec prior to stopping. 6/27 430 feet in 40 sec; 04/25/2023= 6 min complete - 500 feet using 4WW  Time 12   Period Weeks   Status ONGOING  Target Date 07/25/2023   PT LONG TERM GOAL #8  Title Pt will decrease 5TSTS by at least 2 seconds (without UE support)  in order to demonstrate clinically significant improvement in LE strength.   Baseline 08/11/2022= 16.33 sec with B hands on knees; 09/15/2022= 22 sec with B hands on knees (patient reports increased weakness since around Christmas and just has not bounced back); 11/08/2022= 18.85 without UE Support; 01/17/2023=30.0 sec without UE support- patient reported not having a good day but tried his best. 02/02/2023- 17.74 sec with UE Support; 03/09/2023 =15.82 sec without UE support; 04/25/2023= 15.77 sec  Time 12   Period Weeks  Status ONGOING  Target Date 07/25/2023  PT LONG TERM GOAL #9  Title Pt will improve BERG by at least 3 points in order to demonstrate clinically significant improvement in balance.   Baseline 11/08/2022= 33/56; 01/17/2023= 45/56; 02/02/2023=45/56; 05/02/2023=45/56  Time 12   Period Weeks  Status ONGOING  Target Date 07/25/2023       Plan -     Clinical Impression Statement Continued with testing for today's recert visit. Patient did demo more progress with TUG indicating decreased risk of falling but still in fall risk category. He did not improve with BERG since last tested and secondary to low back symptoms and functional weakness it  is unclear how much progress he will make. Patient is appropriate for another recert to focus on improving his strength to decrease his fall risk and if no improvement in 4-6 weeks subject to discharge.   Patient will continue to benefit from skilled PT services to address deficits and impairment identified in evaluation in order to maximize independence and safety in basic mobility required for performance of ADL, IADL, and leisure.    Personal Factors and Comorbidities Age;Past/Current Experience;Time since onset of injury/illness/exacerbation;Comorbidity 3+    Comorbidities COPD, History of Cancer, myasthenia gravis, chronic lumbar surgical history    Examination-Activity Limitations Lift;Squat;Bend;Stairs;Stand;Transfers;Probation officer;Bathing;Hygiene/Grooming;Dressing;Toileting;Bed Mobility;Caring for Others;Continence    Examination-Participation Restrictions Yard Work;Church;Cleaning;Driving;Community Activity    Stability/Clinical Decision Making Evolving/Moderate complexity    Rehab Potential Good    Clinical Impairments Affecting Rehab Potential multipe comorbidities    PT Frequency 1-2x / week    PT Duration 12 weeks    PT Treatment/Interventions ADLs/Self Care Home Management;Electrical Stimulation;Moist Heat;Traction;Ultrasound;Gait training;Stair training;Functional mobility training;Therapeutic activities;Therapeutic exercise;Balance training;Neuromuscular re-education;Patient/family education;Manual techniques;Passive range of motion;Dry needling;Joint Manipulations;Cryotherapy;Vestibular;Canalith Repostioning    PT Next Visit Plan Progress LE strengthening, balance training,  and functional endurance.   PT Home Exercise Plan standing strengthening and balance, seated strengthening and coordination.    Consulted and Agree with Plan of Care Patient            2:14 PM, 05/23/23    Lenda Kelp PT Physical Therapist - [facility]  Outpatient Physical Therapy- Main [facility] [phone]     05/23/23, 2:14 PM

## 2023-05-29 ENCOUNTER — Ambulatory Visit
Admission: RE | Admit: 2023-05-29 | Discharge: 2023-05-29 | Disposition: A | Discharge status: Home / Self Care | Payer: Medicare Other | Source: Ambulatory Visit | Attending: Student | Admitting: Student

## 2023-05-29 ENCOUNTER — Emergency Department
Admission: EM | Admit: 2023-05-29 | Discharge: 2023-05-29 | Discharge status: Against Medical Advice | Payer: Medicare Other | Attending: Emergency Medicine | Admitting: Emergency Medicine

## 2023-05-29 DIAGNOSIS — I639 Cerebral infarction, unspecified: Secondary | ICD-10-CM | POA: Diagnosis present

## 2023-05-29 DIAGNOSIS — Z5321 Procedure and treatment not carried out due to patient leaving prior to being seen by health care provider: Secondary | ICD-10-CM | POA: Insufficient documentation

## 2023-05-29 DIAGNOSIS — G20A1 Parkinson's disease without dyskinesia, without mention of fluctuations: Secondary | ICD-10-CM

## 2023-05-29 DIAGNOSIS — G20C Parkinsonism, unspecified: Secondary | ICD-10-CM | POA: Insufficient documentation

## 2023-05-29 LAB — COMPREHENSIVE METABOLIC PANEL
ALT: 6 U/L (ref 0–44)
AST: 13 U/L — ABNORMAL LOW (ref 15–41)
Albumin: 3.9 g/dL (ref 3.5–5.0)
Alkaline Phosphatase: 54 U/L (ref 38–126)
Anion gap: 6 (ref 5–15)
BUN: 19 mg/dL (ref 8–23)
CO2: 29 mmol/L (ref 22–32)
Calcium: 8.8 mg/dL — ABNORMAL LOW (ref 8.9–10.3)
Chloride: 106 mmol/L (ref 98–111)
Creatinine, Ser: 0.86 mg/dL (ref 0.61–1.24)
GFR, Estimated: >60 mL/min (ref >60)
Glucose, Bld: 125 mg/dL — ABNORMAL HIGH (ref 70–99)
Potassium: 4.5 mmol/L (ref 3.5–5.1)
Sodium: 141 mmol/L (ref 135–145)
Total Bilirubin: 0.6 mg/dL (ref 0.3–1.2)
Total Protein: 6.7 g/dL (ref 6.5–8.1)

## 2023-05-29 LAB — DIFFERENTIAL
Abs Immature Granulocytes: 0.02 10*3/uL (ref 0.00–0.07)
Basophils Absolute: 0 10*3/uL (ref 0.0–0.1)
Basophils Relative: 0 %
Eosinophils Absolute: 0 10*3/uL (ref 0.0–0.5)
Eosinophils Relative: 0 %
Immature Granulocytes: 0 %
Lymphocytes Relative: 6 %
Lymphs Abs: 0.3 10*3/uL — ABNORMAL LOW (ref 0.7–4.0)
Monocytes Absolute: 0.3 10*3/uL (ref 0.1–1.0)
Monocytes Relative: 5 %
Neutro Abs: 4.9 10*3/uL (ref 1.7–7.7)
Neutrophils Relative %: 89 %
Smear Review: NORMAL

## 2023-05-29 LAB — APTT: aPTT: 31 s (ref 24–36)

## 2023-05-29 LAB — CBC
HCT: 33.4 % — ABNORMAL LOW (ref 39.0–52.0)
Hemoglobin: 10.5 g/dL — ABNORMAL LOW (ref 13.0–17.0)
MCH: 35.7 pg — ABNORMAL HIGH (ref 26.0–34.0)
MCHC: 31.4 g/dL (ref 30.0–36.0)
MCV: 113.6 fL — ABNORMAL HIGH (ref 80.0–100.0)
Platelets: 223 10*3/uL (ref 150–400)
RBC: 2.94 MIL/uL — ABNORMAL LOW (ref 4.22–5.81)
RDW: 14.6 % (ref 11.5–15.5)
WBC: 5.6 10*3/uL (ref 4.0–10.5)
nRBC: 0.5 % — ABNORMAL HIGH (ref 0.0–0.2)

## 2023-05-29 LAB — PROTIME-INR
INR: 1.3 — ABNORMAL HIGH (ref 0.8–1.2)
Prothrombin Time: 16.2 s — ABNORMAL HIGH (ref 11.4–15.2)

## 2023-05-29 NOTE — ED Notes (Signed)
No answer when called several times from lobby 

## 2023-05-29 NOTE — ED Notes (Signed)
Messaged Ray, MD at this time

## 2023-05-29 NOTE — ED Triage Notes (Signed)
Pt presents to the ED POV with wife from home. Pt had a routine MRI at 1430 today for his parkinson's which showed an acute infarct the right parietal lobe. PCP sent him here for further workup. Pt denies feeling any different. NIH 0.

## 2023-05-29 NOTE — ED Notes (Addendum)
No answer when called several times from lobby; no answer when phone # listed in chart called

## 2023-05-30 ENCOUNTER — Ambulatory Visit: Payer: Medicare Other

## 2023-05-31 ENCOUNTER — Other Ambulatory Visit: Payer: Self-pay

## 2023-05-31 ENCOUNTER — Emergency Department: Payer: Medicare Other

## 2023-05-31 ENCOUNTER — Encounter: Payer: Self-pay | Admitting: Internal Medicine

## 2023-05-31 ENCOUNTER — Observation Stay: Payer: Medicare Other

## 2023-05-31 ENCOUNTER — Observation Stay
Admission: EM | Admit: 2023-05-31 | Discharge: 2023-06-01 | Disposition: A | Discharge status: Home / Self Care | Payer: Medicare Other | Attending: Hospitalist | Admitting: Hospitalist

## 2023-05-31 DIAGNOSIS — I130 Hypertensive heart and chronic kidney disease with heart failure and stage 1 through stage 4 chronic kidney disease, or unspecified chronic kidney disease: Secondary | ICD-10-CM | POA: Diagnosis not present

## 2023-05-31 DIAGNOSIS — I471 Supraventricular tachycardia, unspecified: Principal | ICD-10-CM | POA: Diagnosis present

## 2023-05-31 DIAGNOSIS — E875 Hyperkalemia: Secondary | ICD-10-CM | POA: Diagnosis not present

## 2023-05-31 DIAGNOSIS — I480 Paroxysmal atrial fibrillation: Secondary | ICD-10-CM | POA: Diagnosis not present

## 2023-05-31 DIAGNOSIS — Z7901 Long term (current) use of anticoagulants: Secondary | ICD-10-CM | POA: Insufficient documentation

## 2023-05-31 DIAGNOSIS — I779 Disorder of arteries and arterioles, unspecified: Secondary | ICD-10-CM | POA: Diagnosis present

## 2023-05-31 DIAGNOSIS — N183 Chronic kidney disease, stage 3 unspecified: Secondary | ICD-10-CM | POA: Insufficient documentation

## 2023-05-31 DIAGNOSIS — E785 Hyperlipidemia, unspecified: Secondary | ICD-10-CM | POA: Diagnosis present

## 2023-05-31 DIAGNOSIS — Z8673 Personal history of transient ischemic attack (TIA), and cerebral infarction without residual deficits: Secondary | ICD-10-CM | POA: Diagnosis present

## 2023-05-31 DIAGNOSIS — I639 Cerebral infarction, unspecified: Secondary | ICD-10-CM | POA: Diagnosis not present

## 2023-05-31 DIAGNOSIS — J449 Chronic obstructive pulmonary disease, unspecified: Secondary | ICD-10-CM | POA: Diagnosis present

## 2023-05-31 DIAGNOSIS — E1122 Type 2 diabetes mellitus with diabetic chronic kidney disease: Secondary | ICD-10-CM | POA: Diagnosis not present

## 2023-05-31 DIAGNOSIS — Z87891 Personal history of nicotine dependence: Secondary | ICD-10-CM | POA: Diagnosis not present

## 2023-05-31 DIAGNOSIS — Z7902 Long term (current) use of antithrombotics/antiplatelets: Secondary | ICD-10-CM | POA: Diagnosis not present

## 2023-05-31 DIAGNOSIS — G20C Parkinsonism, unspecified: Secondary | ICD-10-CM | POA: Insufficient documentation

## 2023-05-31 DIAGNOSIS — E669 Obesity, unspecified: Secondary | ICD-10-CM | POA: Diagnosis present

## 2023-05-31 DIAGNOSIS — I5032 Chronic diastolic (congestive) heart failure: Secondary | ICD-10-CM | POA: Diagnosis not present

## 2023-05-31 DIAGNOSIS — G20A1 Parkinson's disease without dyskinesia, without mention of fluctuations: Secondary | ICD-10-CM | POA: Diagnosis present

## 2023-05-31 DIAGNOSIS — E039 Hypothyroidism, unspecified: Secondary | ICD-10-CM | POA: Diagnosis not present

## 2023-05-31 DIAGNOSIS — I4891 Unspecified atrial fibrillation: Secondary | ICD-10-CM | POA: Diagnosis not present

## 2023-05-31 DIAGNOSIS — I10 Essential (primary) hypertension: Secondary | ICD-10-CM | POA: Diagnosis present

## 2023-05-31 DIAGNOSIS — G7000 Myasthenia gravis without (acute) exacerbation: Secondary | ICD-10-CM | POA: Diagnosis present

## 2023-05-31 DIAGNOSIS — Z79899 Other long term (current) drug therapy: Secondary | ICD-10-CM | POA: Diagnosis not present

## 2023-05-31 HISTORY — DX: Cerebral infarction, unspecified: I63.9

## 2023-05-31 LAB — TROPONIN I (HIGH SENSITIVITY): Troponin I (High Sensitivity): 8 ng/L (ref <18)

## 2023-05-31 LAB — CBC
HCT: 35.8 % — ABNORMAL LOW (ref 39.0–52.0)
Hemoglobin: 11.1 g/dL — ABNORMAL LOW (ref 13.0–17.0)
MCH: 35.7 pg — ABNORMAL HIGH (ref 26.0–34.0)
MCHC: 31 g/dL (ref 30.0–36.0)
MCV: 115.1 fL — ABNORMAL HIGH (ref 80.0–100.0)
Platelets: 232 10*3/uL (ref 150–400)
RBC: 3.11 MIL/uL — ABNORMAL LOW (ref 4.22–5.81)
RDW: 14.7 % (ref 11.5–15.5)
WBC: 6.2 10*3/uL (ref 4.0–10.5)
nRBC: 0 % (ref 0.0–0.2)

## 2023-05-31 LAB — BASIC METABOLIC PANEL WITH GFR
Anion gap: 7 (ref 5–15)
BUN: 18 mg/dL (ref 8–23)
CO2: 29 mmol/L (ref 22–32)
Calcium: 9.1 mg/dL (ref 8.9–10.3)
Chloride: 104 mmol/L (ref 98–111)
Creatinine, Ser: 0.99 mg/dL (ref 0.61–1.24)
GFR, Estimated: >60 mL/min (ref >60)
Glucose, Bld: 138 mg/dL — ABNORMAL HIGH (ref 70–99)
Potassium: 5.3 mmol/L — ABNORMAL HIGH (ref 3.5–5.1)
Sodium: 140 mmol/L (ref 135–145)

## 2023-05-31 LAB — APTT: aPTT: 33 s (ref 24–36)

## 2023-05-31 LAB — TSH: TSH: 0.989 u[IU]/mL (ref 0.350–4.500)

## 2023-05-31 LAB — MAGNESIUM: Magnesium: 2.1 mg/dL (ref 1.7–2.4)

## 2023-05-31 LAB — BRAIN NATRIURETIC PEPTIDE: B Natriuretic Peptide: 473.5 pg/mL — ABNORMAL HIGH (ref 0.0–100.0)

## 2023-05-31 MED ORDER — ATORVASTATIN CALCIUM 20 MG PO TABS
10.0000 mg | ORAL_TABLET | Freq: Every day | ORAL | Status: DC
  Administered 2023-06-01: 10 mg via ORAL
  Filled 2023-05-31: qty 1

## 2023-05-31 MED ORDER — ONDANSETRON HCL 4 MG/2ML IJ SOLN: 4.0000 mg | Freq: Three times a day (TID) | INTRAMUSCULAR | Status: DC | PRN

## 2023-05-31 MED ORDER — ALBUTEROL SULFATE (2.5 MG/3ML) 0.083% IN NEBU: 3.0000 mL | INHALATION_SOLUTION | RESPIRATORY_TRACT | Status: DC | PRN

## 2023-05-31 MED ORDER — PREGABALIN 50 MG PO CAPS
50.0000 mg | ORAL_CAPSULE | Freq: Two times a day (BID) | ORAL | Status: DC
  Administered 2023-05-31 – 2023-06-01 (×2): 50 mg via ORAL
  Filled 2023-05-31 (×2): qty 1

## 2023-05-31 MED ORDER — STROKE: EARLY STAGES OF RECOVERY BOOK: Freq: Once | Status: DC

## 2023-05-31 MED ORDER — SODIUM ZIRCONIUM CYCLOSILICATE 5 G PO PACK
5.0000 g | PACK | Freq: Once | ORAL | Status: AC
  Administered 2023-05-31: 5 g via ORAL
  Filled 2023-05-31: qty 1

## 2023-05-31 MED ORDER — TRAZODONE HCL 50 MG PO TABS
50.0000 mg | ORAL_TABLET | Freq: Every evening | ORAL | Status: DC | PRN
  Administered 2023-05-31: 50 mg via ORAL
  Filled 2023-05-31: qty 1

## 2023-05-31 MED ORDER — ADENOSINE 6 MG/2ML IV SOLN
6.0000 mg | Freq: Once | INTRAVENOUS | Status: AC
  Administered 2023-05-31: 6 mg via INTRAVENOUS

## 2023-05-31 MED ORDER — CARBIDOPA-LEVODOPA 25-100 MG PO TABS
1.0000 | ORAL_TABLET | Freq: Three times a day (TID) | ORAL | Status: DC
  Administered 2023-05-31 – 2023-06-01 (×2): 1 via ORAL
  Filled 2023-05-31 (×2): qty 1

## 2023-05-31 MED ORDER — VITAMIN D 25 MCG (1000 UNIT) PO TABS
1000.0000 [IU] | ORAL_TABLET | Freq: Every day | ORAL | Status: DC
  Administered 2023-06-01: 1000 [IU] via ORAL
  Filled 2023-05-31: qty 1

## 2023-05-31 MED ORDER — APIXABAN 5 MG PO TABS
5.0000 mg | ORAL_TABLET | Freq: Two times a day (BID) | ORAL | Status: DC
  Administered 2023-05-31 – 2023-06-01 (×2): 5 mg via ORAL
  Filled 2023-05-31 (×2): qty 1

## 2023-05-31 MED ORDER — PREDNISONE 10 MG PO TABS: 10.0000 mg | ORAL_TABLET | ORAL | Status: DC

## 2023-05-31 MED ORDER — SENNOSIDES-DOCUSATE SODIUM 8.6-50 MG PO TABS: 1.0000 | ORAL_TABLET | Freq: Every evening | ORAL | Status: DC | PRN

## 2023-05-31 MED ORDER — METOPROLOL TARTRATE 5 MG/5ML IV SOLN: 2.5000 mg | INTRAVENOUS | Status: DC | PRN

## 2023-05-31 MED ORDER — ACETAMINOPHEN 325 MG PO TABS: 650.0000 mg | ORAL_TABLET | ORAL | Status: DC | PRN

## 2023-05-31 MED ORDER — HYDROCODONE-ACETAMINOPHEN 10-325 MG PO TABS: 1.0000 | ORAL_TABLET | Freq: Four times a day (QID) | ORAL | Status: DC | PRN

## 2023-05-31 MED ORDER — LEVOTHYROXINE SODIUM 50 MCG PO TABS
50.0000 ug | ORAL_TABLET | Freq: Every day | ORAL | Status: DC
  Administered 2023-06-01: 50 ug via ORAL
  Filled 2023-05-31: qty 1

## 2023-05-31 MED ORDER — TORSEMIDE 20 MG PO TABS
20.0000 mg | ORAL_TABLET | Freq: Every day | ORAL | Status: DC
  Administered 2023-06-01: 20 mg via ORAL
  Filled 2023-05-31: qty 1

## 2023-05-31 MED ORDER — ACETAMINOPHEN 325 MG PO TABS: 650.0000 mg | ORAL_TABLET | Freq: Four times a day (QID) | ORAL | Status: DC | PRN

## 2023-05-31 MED ORDER — DM-GUAIFENESIN ER 30-600 MG PO TB12: 1.0000 | ORAL_TABLET | Freq: Two times a day (BID) | ORAL | Status: DC | PRN

## 2023-05-31 MED ORDER — ACETAMINOPHEN 160 MG/5ML PO SOLN: 650.0000 mg | ORAL | Status: DC | PRN

## 2023-05-31 MED ORDER — AZATHIOPRINE 50 MG PO TABS
150.0000 mg | ORAL_TABLET | Freq: Every day | ORAL | Status: DC
  Administered 2023-06-01: 150 mg via ORAL
  Filled 2023-05-31: qty 3

## 2023-05-31 MED ORDER — ACETAMINOPHEN 650 MG RE SUPP: 650.0000 mg | RECTAL | Status: DC | PRN

## 2023-05-31 MED ORDER — IOHEXOL 350 MG/ML SOLN
75.0000 mL | Freq: Once | INTRAVENOUS | Status: AC | PRN
  Administered 2023-05-31: 75 mL via INTRAVENOUS

## 2023-05-31 MED ORDER — HYDRALAZINE HCL 20 MG/ML IJ SOLN: 5.0000 mg | INTRAMUSCULAR | Status: DC | PRN

## 2023-05-31 MED ORDER — CLOPIDOGREL BISULFATE 75 MG PO TABS
75.0000 mg | ORAL_TABLET | Freq: Every day | ORAL | Status: DC
  Administered 2023-06-01: 75 mg via ORAL
  Filled 2023-05-31: qty 1

## 2023-05-31 NOTE — ED Notes (Signed)
Passed bedside swallow screen, diet started. MD aware.

## 2023-05-31 NOTE — ED Provider Notes (Signed)
[facility] Provider Note    Event Date/Time   First MD Initiated Contact with Patient 05/31/23 1714     (approximate)  History   Chief Complaint: SVT  HPI  Francisco [name] is a 77 y.o. male with a past medical history of arthritis, atrial fibrillation, CHF, CKD, COPD, hypertension, hyperlipidemia, presents to the emergency department for rapid heart rate.  Patient's history shows a prior diagnosis of atrial fibrillation although the patient states he has never been told that he had A-fib.  Patient does take Eliquis.  Patient was in the emergency department 2 days ago for an outpatient MRI that was ordered showing an acute CVA.  Patient ultimately left the emergency department due to a prolonged wait.  He went to Dr. Darrold Junker his cardiologist today, was diagnosed with a very rapid heart rate was sent to the emergency department for evaluation.  Upon arrival patient appears to be in SVT around 165 bpm.  Patient denies any chest pain or shortness of breath.  Is unaware of the palpitations.  Physical Exam   Triage Vital Signs: ED Triage Vitals  Encounter Vitals Group     BP 05/31/23 1705 124/89     Systolic BP Percentile --      Diastolic BP Percentile --      Pulse Rate 05/31/23 1706 (!) 166     Resp 05/31/23 1707 19     Temp 05/31/23 1707 97.9 F (36.6 C)     Temp Source 05/31/23 1707 Oral     SpO2 05/31/23 1706 100 %     Weight --      Height --      Head Circumference --      Peak Flow --      Pain Score 05/31/23 1701 0     Pain Loc --      Pain Education --      Exclude from Growth Chart --     Most recent vital signs: Vitals:   05/31/23 1708 05/31/23 1714  BP:    Pulse: (!) 160 66  Resp: (!) 23 16  Temp:    SpO2: 98% 100%    General: Awake, no distress.  CV:  Good peripheral perfusion.  Regular rhythm rate around 160 bpm. Resp:  Normal effort.  Equal breath sounds bilaterally.  Abd:  No distention.  Soft, nontender.  No rebound or  guarding.  ED Results / Procedures / Treatments   EKG  EKG viewed and interpreted by myself appears to have supraventricular tachycardia 166 bpm with a narrow QRS, normal axis, normal intervals with nonspecific ST changes.  Repeat EKG viewed and interpreted by myself appear to show atrial fibrillation at 67 bpm with a narrow QRS, normal axis, normal intervals nonspecific ST changes.  Patient is EKG from 2 days ago 9/16 appears to show a sinus rhythm.  RADIOLOGY  I have reviewed and interpreted the chest x-ray images.  No obvious consolidation on my evaluation. Radiology is read the x-ray is negative.   MEDICATIONS ORDERED IN ED: Medications  adenosine (ADENOCARD) 6 MG/2ML injection 6 mg (6 mg Intravenous Given 05/31/23 1711)     IMPRESSION / MDM / ASSESSMENT AND PLAN / ED COURSE  I reviewed the triage vital signs and the nursing notes.  Patient's presentation is most consistent with acute presentation with potential threat to life or bodily function.  Patient presents to the emergency department for a very rapid heart rate what appears to be SVT.  Patient was given 6 mg of adenosine and converted from 160s down to around 70 bpm what appears to be a rate controlled atrial fibrillation rhythm.  I reviewed the patient's chart he had a recent outpatient MRI showing acute infarct recommended admission to the hospital however on 9/16 when he came to the hospital there was a prolonged wait and the patient ultimately left before being seen.  Given the patient's acute CVA now with atrial fibrillation today SVT requiring adenosine we will admit to the hospitalist service for ongoing workup and treatment.  I reviewed the patient's radiology read from 9/16 showing an acute infarct on outpatient MRI.   CRITICAL CARE Performed by: Minna Antis   Total critical care time: 30 minutes  Critical care time was exclusive of separately billable procedures and treating other patients.  Critical  care was necessary to treat or prevent imminent or life-threatening deterioration.  Critical care was time spent personally by me on the following activities: development of treatment plan with patient and/or surrogate as well as nursing, discussions with consultants, evaluation of patient's response to treatment, examination of patient, obtaining history from patient or surrogate, ordering and performing treatments and interventions, ordering and review of laboratory studies, ordering and review of radiographic studies, pulse oximetry and re-evaluation of patient's condition.   FINAL CLINICAL IMPRESSION(S) / ED DIAGNOSES   Supraventricular tachycardia Atrial fibrillation Recent acute CVA   Note:  This document was prepared using Dragon voice recognition software and may include unintentional dictation errors.   Minna Antis, MD 05/31/23 2237

## 2023-05-31 NOTE — H&P (Signed)
History and Physical    Francisco [name]:[id number] DOB: [birth year] DOA: 05/31/2023  Referring MD/NP/PA:   PCP: Lynnea Ferrier, MD   Patient coming from:  The patient is coming from home.     Chief Complaint: Rapid heart rate  HPI: Francisco [name] is a 77 y.o. male with medical history significant of SVT and A-fib on Eliquis, Parkinson's disease, HTN,  HLD, COPD, PVD, dCHD, stroke, hypothyroidism, bilateral carotid artery stenosis (s/p of stent of right carotid artery), myasthenia gravis, Merkel cell carcinoma, who presents with rapid heart rate.  Patient states that he has history of Parkinson's disease.  He had follow-up visit with his doctor who ordered an MRI for brain 2 days ago on 05/29/23, which was positive for stroke. Chart review showed that MRI showed punctate acute infarct in the right parietal lobe. Pt was advised to go to ED. He was in the emergency department 2 days ago, but ultimately left the emergency department due to a prolonged wait.   Pt had follow up visit with his neurologist, Dr. Sherryll Burger and his PCP, Dr. Graciela Husbands today. His PCP found that he had rapid heart rate. Stat EKG showed SVT. This was discussed with Dr. Darrold Junker of cardiology, who recommended that pt needs to to go the emergency room for adenosine or IV calcium channel blocker to try to break this rhythm. Upon arrival to ED, patient appears to be in SVT with HR around 165 bpm.  Patient was given 6 mg of adenosine in ED, converted to sinus rhythm, with heart rates 60-80s.  Patient denies chest pain, cough, shortness of breath.  No palpitation or heart racing feeling.  He states that he has chronic bilateral leg weakness due to Parkinson's disease, which has not changed significantly.  No facial droop or slurred speech.  No hearing loss or blurred vision.  Denies fever or chills.  No nausea, vomiting, diarrhea or abdominal pain.  No symptoms of UTI.  MRI-brain on 05/29/23 1. Punctate acute infarct in the right  parietal lobe. 2. Unchanged old infarct in the right middle frontal gyrus. 3. Few cortical foci of susceptibility, which can be seen in the setting of chronic hypertension.   Data reviewed independently and ED Course: pt was found to have WBC 6.2, INR 1.3, TSH  0.989, potassium 5.3, magnesium 2.1, GFR> 60, temperature normal, blood pressure 140/55, heart rate 23, 18, oxygen saturation 98% on room air.  Chest x-ray negative.  CTA of head and neck negative for LVO.  Patient is placed on telemetry bed for observation.   CTA of head and neck: 1. No acute intracranial process. 2. No intracranial large vessel occlusion or significant stenosis. 3. Interval placement of a stent across the right carotid bifurcation, which is patent. No hemodynamically significant stenosis in the neck. 4. Redemonstrated 3 mm posteriorly directed outpouching from the distal right ICA, which may represent an infundibulum at the origin of the right posterior communicating artery versus a tiny aneurysm.   Aortic Atherosclerosis (ICD10-I70.0).     EKG: I have personally reviewed.  First EKG showed SVT, heart rate 166, QTc 448, low voltage.  The second EKG after giving adenosine showed A-fib, low voltage, QTc 385, heart rate 67.   Review of Systems:   General: no fevers, chills, no body weight gain, fatigue HEENT: no blurry vision, hearing changes or sore throat Respiratory: no dyspnea, coughing, wheezing CV: no chest pain, no palpitations GI: no nausea, vomiting, abdominal pain, diarrhea, constipation GU: no dysuria,  burning on urination, increased urinary frequency, hematuria  Ext: has trace leg edema Neuro: no unilateral weakness, numbness, or tingling, no vision change or hearing loss Skin: no rash, no skin tear. MSK: No muscle spasm, no deformity, no limitation of range of movement in spin Heme: No easy bruising.  Travel history: No recent long distant travel.   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   Carotid stenosis    CHF (congestive heart failure) (HCC)    CKD (chronic kidney disease)    CKD (chronic kidney disease) stage 3, GFR 30-59 ml/min (HCC)    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   DDD (degenerative disc disease), cervical    Dysrhythmia    post carotid stent bradycardia; PAF 09/2020   GERD (gastroesophageal reflux disease)    Hypercholesterolemia    Hypertension    Hypothyroidism    pt takes Levothyroxine daily   Lumbosacral spinal stenosis    Myasthenia gravis, adult form (HCC)    PAD (peripheral artery disease) (HCC)    Parkinson disease    Shortness of breath    Lung MD- Dr Joetta Manners   Sleep apnea    do not use CPAP every night   Stroke (HCC) 05/31/2023    Past Surgical History:  Procedure Laterality Date   ANTERIOR CERVICAL DECOMP/DISCECTOMY FUSION  07/18/2011   Procedure: ANTERIOR CERVICAL DECOMPRESSION/DISCECTOMY FUSION 2 LEVELS;  Surgeon: Kathaleen Maser Pool;  Location: [facility];  Service: Neurosurgery;  Laterality: N/A;  cervical five-six, cervical six-seven anterior cervical discectomy and fusion   ATRIAL FIBRILLATION ABLATION     BACK SURGERY     in 1985 [facility]   BILATERAL CARPAL TUNNEL RELEASE     01/2020 Right, 04/2020 Left   CARDIAC CATHETERIZATION     2005 at [facility], no stents   CAROTID PTA/STENT INTERVENTION N/A 09/17/2020   Procedure: CAROTID PTA/STENT INTERVENTION;  Surgeon: Annice Needy, MD;  Location: [facility];  Service: Cardiovascular;  Laterality: N/A;   CATARACT EXTRACTION  W/PHACO Left 01/06/2020   Procedure: CATARACT EXTRACTION PHACO AND INTRAOCULAR LENS PLACEMENT (IOC) ISTENT INJ LEFT 3.81  00:33.3;  Surgeon: Nevada Crane, MD;  Location: [facility];  Service: Ophthalmology;  Laterality: Left;   CATARACT EXTRACTION W/PHACO Right 02/03/2020   Procedure: CATARACT EXTRACTION PHACO AND INTRAOCULAR LENS PLACEMENT (IOC) RIGHT ISTENT INJ;  Surgeon: Nevada Crane, MD;  Location: [facility];  Service: Ophthalmology;  Laterality: Right;  4.29 0:35.6   COLONOSCOPY     HERNIA REPAIR Left    inguinal hernia repair in 1985   LUMBAR LAMINECTOMY/DECOMPRESSION MICRODISCECTOMY Left 02/24/2014   Procedure: LUMBAR LAMINECTOMY/DECOMPRESSION MICRODISCECTOMY LUMBAR THREE-FOUR, FOUR-FIVE, LEFT FIVE-SACRAL ONE ;  Surgeon: Temple Pacini, MD;  Location: [facility];  Service: Neurosurgery;  Laterality: Left;  LUMBAR LAMINECTOMY/DECOMPRESSION MICRODISCECTOMY LUMBAR THREE-FOUR, FOUR-FIVE, LEFT FIVE-SACRAL ONE    LUMBAR LAMINECTOMY/DECOMPRESSION MICRODISCECTOMY N/A 05/03/2021   Procedure: Laminectomy and Foraminotomy - L2-L3;  Surgeon:  Julio Sicks, MD;  Location: [facility] OR;  Service: Neurosurgery;  Laterality: N/A;  3C   POSTERIOR CERVICAL FUSION/FORAMINOTOMY N/A 08/07/2020   Procedure: C3-6 POSTERIOR FUSION WITH DECOMPRESSION;  Surgeon: Venetia Night, MD;  Location: [facility] OR[facility];  Service: Neurosurgery;  Laterality: N/A;   PROSTATECTOMY  04/2013   [facility] Dr Joycelyn Das     Social History:  reports that he quit smoking about 21 years ago. His smoking use included cigarettes. He started smoking about 41 years ago. He has a 20 pack-year smoking history. He has never used smokeless tobacco. He reports current alcohol use of about 3.0 standard drinks of alcohol per week. He reports that he does not use drugs.  Family History:  Family History  Problem Relation Age of Onset   Hypertension Mother    Stroke Mother    Stroke Father      Prior to Admission medications    Medication Sig Start Date End Date Taking? Authorizing Provider  metoprolol tartrate (LOPRESSOR) 25 MG tablet Take 12.5 mg by mouth 2 (two) times daily. 01/23/23  Yes [provider]  apixaban (ELIQUIS) 5 MG TABS tablet Take 5 mg by mouth 2 (two) times daily.    [provider]  atorvastatin (LIPITOR) 10 MG tablet Take 1 tablet by mouth once daily 12/21/22   Maeola Harman, MD  azaTHIOprine (IMURAN) 50 MG tablet Take 150 mg by mouth daily.     [provider]  carbidopa-levodopa (SINEMET IR) 25-100 MG tablet Take 1 tablet by mouth 3 (three) times daily. 11/11/21   [provider]  Cholecalciferol (D3-1000) 25 MCG (1000 UT) capsule Take 1,000 Units by mouth daily.    [provider]  clopidogrel (PLAVIX) 75 MG tablet Take 1 tablet by mouth once daily 04/25/23   Georgiana Spinner, NP  HYDROcodone-acetaminophen (NORCO) 10-325 MG tablet Take 1-2 tablets by mouth See admin instructions. Take 1 to 2 tablets every morning, may take 1 tablet every 6 hours as needed for pain    [provider]  levothyroxine (SYNTHROID) 50 MCG tablet Take 1 tablet (50 mcg total) by mouth daily at 6 (six) AM. Patient taking differently: Take 50 mcg by mouth daily at 6 (six) AM. 01/01/22 01/11/23  Charise Killian, MD  metoprolol succinate (TOPROL-XL) 25 MG 24 hr tablet Take 0.5 tablets (12.5 mg total) by mouth daily. Take with or immediately following a meal. 01/11/23 04/11/23  Clarisa Kindred A, FNP  predniSONE (DELTASONE) 10 MG tablet Take 10 mg by mouth every other day. Taking 20 mg Monday, Wed., and Friday    [provider]  pregabalin (LYRICA) 50 MG capsule Take 50 mg by mouth 2 (two) times daily.    [provider]  spironolactone (ALDACTONE) 25 MG tablet Take 25 mg by mouth daily. 11/10/21   [provider]  torsemide (DEMADEX) 20 MG tablet Take 20 mg by mouth daily. 11/10/21   [provider]  traZODone (DESYREL) 50 MG tablet Take  50 mg by mouth at bedtime as needed.    [provider]    Physical Exam: Vitals:   05/31/23 2215 05/31/23 2230 05/31/23 2330 05/31/23 2358  BP:   116/75   Pulse: 63 79 (!) 58   Resp: 14 19 17    Temp:    98 F (36.7 C)  TempSrc:      SpO2: 100% 99% 97%    General: Not in acute distress HEENT:       Eyes: PERRL, EOMI, no  jaundice       ENT: No discharge from the ears and nose, no pharynx injection, no tonsillar enlargement.        Neck: No JVD, no bruit, no mass felt. Heme: No neck lymph node enlargement. Cardiac: S1/S2, irregular rhythm, No gallops or rubs. Respiratory: No rales, wheezing, rhonchi or rubs. GI: Soft, nondistended, nontender, no rebound pain, no organomegaly, BS present. GU: No hematuria Ext: has trace leg edema bilaterally. 1+DP/PT pulse bilaterally. Musculoskeletal: No joint deformities, No joint redness or warmth, no limitation of ROM in spin. Skin: No rashes.  Neuro: Alert, oriented X3, cranial nerves II-XII grossly intact, moves all extremities normally. Muscle strength 4/5 in all extremities, sensation to light touch intact. Psych: Patient is not psychotic, no suicidal or hemocidal ideation.  Labs on Admission: I have personally reviewed following labs and imaging studies  CBC: Recent Labs  Lab 05/29/23 1822 05/31/23 1706  WBC 5.6 6.2  NEUTROABS 4.9  --   HGB 10.5* 11.1*  HCT 33.4* 35.8*  MCV 113.6* 115.1*  PLT 223 232   Basic Metabolic Panel: Recent Labs  Lab 05/29/23 1822 05/31/23 1706  NA 141 140  K 4.5 5.3*  CL 106 104  CO2 29 29  GLUCOSE 125* 138*  BUN 19 18  CREATININE 0.86 0.99  CALCIUM 8.8* 9.1  MG  --  2.1   GFR: Estimated Creatinine Clearance: 75.2 mL/min (by C-G formula based on SCr of 0.99 mg/dL). Liver Function Tests: Recent Labs  Lab 05/29/23 1822  AST 13*  ALT 6  ALKPHOS 54  BILITOT 0.6  PROT 6.7  ALBUMIN 3.9   No results for input(s): "LIPASE", "AMYLASE" in the last 168 hours. No results for  input(s): "AMMONIA" in the last 168 hours. Coagulation Profile: Recent Labs  Lab 05/29/23 1822  INR 1.3*   Cardiac Enzymes: No results for input(s): "CKTOTAL", "CKMB", "CKMBINDEX", "TROPONINI" in the last 168 hours. BNP (last 3 results) No results for input(s): "PROBNP" in the last 8760 hours. HbA1C: No results for input(s): "HGBA1C" in the last 72 hours. CBG: No results for input(s): "GLUCAP" in the last 168 hours. Lipid Profile: No results for input(s): "CHOL", "HDL", "LDLCALC", "TRIG", "CHOLHDL", "LDLDIRECT" in the last 72 hours. Thyroid Function Tests: Recent Labs    05/31/23 1706  TSH 0.989   Anemia Panel: No results for input(s): "VITAMINB12", "FOLATE", "FERRITIN", "TIBC", "IRON", "RETICCTPCT" in the last 72 hours. Urine analysis:    Component Value Date/Time   COLORURINE YELLOW (A) 05/13/2023 1409   APPEARANCEUR CLEAR (A) 05/13/2023 1409   LABSPEC 1.025 05/13/2023 1409   PHURINE 5.5 05/13/2023 1409   GLUCOSEU NEGATIVE 05/13/2023 1409   HGBUR NEGATIVE 05/13/2023 1409   BILIRUBINUR NEGATIVE 05/13/2023 1409   KETONESUR 15 (A) 05/13/2023 1409   PROTEINUR NEGATIVE 05/13/2023 1409   NITRITE NEGATIVE 05/13/2023 1409   LEUKOCYTESUR NEGATIVE 05/13/2023 1409   Sepsis Labs: @LABRCNTIP (procalcitonin:4,lacticidven:4) )No results found for this or any previous visit (from the past 240 hour(s)).   Radiological Exams on Admission: CT ANGIO HEAD NECK W WO CM  Result Date: 05/31/2023 CLINICAL DATA:  Punctate acute infarct on recent MRI, determine embolic source EXAM: CT ANGIOGRAPHY HEAD AND NECK WITH AND WITHOUT CONTRAST TECHNIQUE: Multidetector CT imaging of the head and neck was performed using the standard protocol during bolus administration of intravenous contrast. Multiplanar CT image reconstructions and MIPs were obtained to evaluate the vascular anatomy. Carotid stenosis measurements (when applicable) are obtained utilizing NASCET criteria, using the distal internal  carotid diameter  as the denominator. RADIATION DOSE REDUCTION: This exam was performed according to the departmental dose-optimization program which includes automated exposure control, adjustment of the mA and/or kV according to patient size and/or use of iterative reconstruction technique. CONTRAST:  75mL OMNIPAQUE IOHEXOL 350 MG/ML SOLN COMPARISON:  07/08/2020 CTA head and neck, correlation is also made with 05/29/2023 MRI head and 12/06/2022 CT head FINDINGS: CT HEAD FINDINGS Brain: No evidence of acute infarct, hemorrhage, mass, mass effect, or midline shift. No hydrocephalus or extra-axial fluid collection. Vascular: No hyperdense vessel. Skull: Negative for fracture or focal lesion. Sinuses/Orbits: Mucosal thickening in the right maxillary sinus. Status post bilateral lens replacements. Other: The mastoid air cells are well aerated. CTA NECK FINDINGS Aortic arch: Standard branching. Imaged portion shows no evidence of aneurysm or dissection. Aortic atherosclerosis, which causes mild stenosis of the origin the left subclavian artery. Right carotid system: Interval placement of a stent extending from the distal common carotid artery into the proximal internal carotid artery. The stent appears patent, without evidence of in stent thrombosis. No evidence of stenosis, dissection, or occlusion in the right carotid system. The proximal ECA is patent. Left carotid system: No evidence of dissection, occlusion, or hemodynamically significant stenosis (greater than 50%). Retropharyngeal course of the proximal ICA. Vertebral arteries: No evidence of dissection, occlusion, or hemodynamically significant stenosis (greater than 50%). Skeleton: No acute osseous abnormality. Degenerative changes in the cervical spine. Status post ACDF C5-C7 and posterior fusion C3-C6. Other neck: Redemonstrated calcifications in the right parotid gland. Otherwise negative. Upper chest: No focal pulmonary opacity or pleural effusion. Review of  the MIP images confirms the above findings CTA HEAD FINDINGS Anterior circulation: Both internal carotid arteries are patent to the termini, with calcification but without significant stenosis. Redemonstrated 3 mm posteriorly directed outpouching from distal ICA, near the terminus (series 10, image 96), which appears unchanged from the prior exam. This may represent an infundibulum at the origin the right posterior communicating artery, which is quite diminutive. A1 segments patent. Normal anterior communicating artery. Anterior cerebral arteries are patent to their distal aspects without significant stenosis. No M1 stenosis or occlusion. MCA branches perfused to their distal aspects without significant stenosis. Posterior circulation: Vertebral arteries patent to the vertebrobasilar junction without significant stenosis. Posterior inferior cerebellar arteries patent proximally. Basilar artery is quite tortuous but patent to its distal aspect without significant stenosis. Superior cerebellar arteries patent proximally. Patent P1 segments. PCAs perfused to their distal aspects without significant stenosis. A diminutive right posterior communicating artery is seen. Venous sinuses: As permitted by contrast timing, patent. Anatomic variants: None significant. No evidence of aneurysm or vascular malformation. Review of the MIP images confirms the above findings IMPRESSION: 1. No acute intracranial process. 2. No intracranial large vessel occlusion or significant stenosis. 3. Interval placement of a stent across the right carotid bifurcation, which is patent. No hemodynamically significant stenosis in the neck. 4. Redemonstrated 3 mm posteriorly directed outpouching from the distal right ICA, which may represent an infundibulum at the origin of the right posterior communicating artery versus a tiny aneurysm. Aortic Atherosclerosis (ICD10-I70.0). Electronically Signed   By: Wiliam Ke M.D.   On: 05/31/2023 20:30   DG  Chest Port 1 View  Result Date: 05/31/2023 CLINICAL DATA:  Chest pain. EXAM: PORTABLE CHEST 1 VIEW COMPARISON:  12/06/2022. FINDINGS: Low lung volume. Bilateral lung fields are clear. Note is made of elevated right hemidiaphragm. Bilateral lateral costophrenic angles are clear. Stable cardio-mediastinal silhouette. No acute osseous abnormalities. Lower cervical spinal fixation  hardware seen. The soft tissues are within normal limits. IMPRESSION: No active disease. Electronically Signed   By: Jules Schick M.D.   On: 05/31/2023 18:22      Assessment/Plan Principal Problem:   SVT (supraventricular tachycardia) Active Problems:   Paroxysmal atrial fibrillation (HCC)   Stroke (HCC)   Hyperkalemia   Chronic diastolic CHF (congestive heart failure) (HCC)   Obesity (BMI 30-39.9)   COPD (chronic obstructive pulmonary disease) (HCC)   Myasthenia gravis (HCC)   Bilateral carotid artery disease (HCC)   Hypothyroid   Essential hypertension   Parkinson's disease   HLD (hyperlipidemia)   Assessment and Plan:   SVT (supraventricular tachycardia):  HR was up to 160s, converted to sinus rhythm after giving 6 mg of adenosine, with heart rate 60-80s currently.  Patient is asymptomatic.  -Placed on telemetry bed for observation -As needed IV metoprolol 2.5 mg every 2 hours for heart rate> 125  Paroxysmal atrial fibrillation (HCC) -Continue Eliquis -As needed IV metoprolol as above  Stroke (HCC):  MRI-brain on 05/29/23 showed punctate acute infarct in the right parietal lobe.  Patient is asymptomatic.  CTA of head and neck negative for LVO.  - pt is out of window for permissive HTN  - Patient is on Plavix and Eliquis - Statin: Lipitor - fasting lipid panel and HbA1c  - 2D transthoracic echocardiography  - swallowing screen.  - PT/OT consult  Hyperkalemia: K 5.3 -Hold spironolactone -Lokelma 5 g  Chronic diastolic CHF (congestive heart failure) (HCC): 2D echo on 12/30/2021 showed EF of  50-55%.  Patient has trace leg edema, no shortness breath or JVD.  CHF seem to be compensated. -Check BNP -Continue torsemide -Hold spironolactone due to hyperkalemia  Obesity (BMI 30-39.9): Body weight 106.6 kg, BMI 33.72 -Encourage losing weight -Exercise and healthy diet  COPD (chronic obstructive pulmonary disease) (HCC): Stable -Bronchodilators and Mucinex  Myasthenia gravis (HCC): -Continue home prednisone 10 mg on Monday/Wednesday/Friday -Continue Imuran  Bilateral carotid artery disease (HCC) -On Plavix, Eliquis and Lipitor  Hypothyroid -Synthroid  Essential hypertension -IV hydralazine as needed -Patient is on torsemide  Parkinson's disease -Sinemet -Fall precaution  HLD (hyperlipidemia) -Lipitor        DVT ppx: on Eliquis  Code Status: Full code   Family Communication: Yes, patient's wife at bed side.       Disposition Plan:  Anticipate discharge back to previous environment  Consults called:  none  Admission status and Level of care: Telemetry Medical:   for obs    Dispo: The patient is from: Home              Anticipated d/c is to: Home              Anticipated d/c date is: 1 day              Patient currently is not medically stable to d/c.    Severity of Illness:  The appropriate patient status for this patient is OBSERVATION. Observation status is judged to be reasonable and necessary in order to provide the required intensity of service to ensure the patient's safety. The patient's presenting symptoms, physical exam findings, and initial radiographic and laboratory data in the context of their medical condition is felt to place them at decreased risk for further clinical deterioration. Furthermore, it is anticipated that the patient will be medically stable for discharge from the hospital within 2 midnights of admission.        Date of Service 05/31/2023  Lorretta Harp [facility]   If 7PM-7AM, please contact  night-coverage [email or web address] 05/31/2023, 11:59 PM

## 2023-05-31 NOTE — ED Notes (Signed)
Pt brought to room 13. Pt placed on zoll and monitor. IV access obtained. MD at bedside

## 2023-05-31 NOTE — ED Triage Notes (Signed)
Pt sent from [facility]. Pt was there as a follow up from recent visit and was found to be in SVT. Pt denies CP and SOB.

## 2023-05-31 NOTE — ED Notes (Addendum)
Pt Hr converted to 70s after 6mg  adenosine. EKG obtained

## 2023-06-01 ENCOUNTER — Observation Stay
Admit: 2023-06-01 | Discharge: 2023-06-01 | Disposition: A | Discharge status: Home / Self Care | Payer: Medicare Other | Attending: Internal Medicine | Admitting: Internal Medicine

## 2023-06-01 DIAGNOSIS — I471 Supraventricular tachycardia, unspecified: Secondary | ICD-10-CM | POA: Diagnosis not present

## 2023-06-01 LAB — ECHOCARDIOGRAM COMPLETE
AR max vel: 1.82 cm2
AV Area VTI: 1.63 cm2
AV Area mean vel: 1.88 cm2
AV Mean grad: 3.3 mmHg
AV Peak grad: 6.6 mmHg
Ao pk vel: 1.28 m/s
Area-P 1/2: 4.06 cm2
MV VTI: 1.18 cm2
S' Lateral: 3 cm

## 2023-06-01 LAB — LIPID PANEL
Cholesterol: 167 mg/dL (ref 0–200)
HDL: 59 mg/dL (ref >40)
LDL Cholesterol: 86 mg/dL (ref 0–99)
Total CHOL/HDL Ratio: 2.8 RATIO
Triglycerides: 112 mg/dL (ref <150)
VLDL: 22 mg/dL (ref 0–40)

## 2023-06-01 LAB — HEMOGLOBIN A1C
Hgb A1c MFr Bld: 5.5 % (ref 4.8–5.6)
Mean Plasma Glucose: 111 mg/dL

## 2023-06-01 LAB — BASIC METABOLIC PANEL
Anion gap: 5 (ref 5–15)
BUN: 17 mg/dL (ref 8–23)
CO2: 29 mmol/L (ref 22–32)
Calcium: 8.3 mg/dL — ABNORMAL LOW (ref 8.9–10.3)
Chloride: 104 mmol/L (ref 98–111)
Creatinine, Ser: 0.9 mg/dL (ref 0.61–1.24)
GFR, Estimated: >60 mL/min (ref >60)
Glucose, Bld: 87 mg/dL (ref 70–99)
Potassium: 3.9 mmol/L (ref 3.5–5.1)
Sodium: 138 mmol/L (ref 135–145)

## 2023-06-01 NOTE — Evaluation (Signed)
Occupational Therapy Evaluation Patient Details Name: Francisco [name] MRN: [id number] DOB: February 16, 1946 Today's Date: 06/01/2023   History of Present Illness Francisco [name] is a 77 y.o. male with medical history significant of SVT and A-fib on Eliquis, Parkinson's disease, HTN,  HLD, COPD, PVD, dCHD, stroke, hypothyroidism, bilateral carotid artery stenosis (s/p of stent of right carotid artery), myasthenia gravis,  Merkel cell carcinoma, laminectomy in 2022, who presents with rapid heart rate. MRI + for acute infarct in R parietal lobe. Pt followed up with neuro/PCP which found rapid heart rate, stat EKG showed SVT.   Clinical Impression   Pt received in bed, spouse present and agreeable for OT eval. RN present for medications during session. Pt denies pain or recent changes in strength or vision. Reports feeling slightly fatigued and somewhat weaker due to being in bed. Pt lives with spouse, performs ADLs with MOD I, and uses a wc for mobility. Was previously receiving outpatient PT services. Bed mobility with minA, stands to use urinal with minA for clothing management, and simulates donning/doffing socks with setup. Discussed bed mobility techniques for comfort due to pt's history of back surgery. Patient will benefit from acute OT to increase overall independence in the areas of ADLs, functional mobility, in order to safely discharge.       If plan is discharge home, recommend the following: A little help with walking and/or transfers;A little help with bathing/dressing/bathroom;Assistance with cooking/housework;Direct supervision/assist for medications management;Assist for transportation;Help with stairs or ramp for entrance    Functional Status Assessment  Patient has had a recent decline in their functional status and demonstrates the ability to make significant improvements in function in a reasonable and predictable amount of time.  Equipment Recommendations  None recommended by OT     Recommendations for Other Services Other (comment)     Precautions / Restrictions Precautions Precautions: Fall Restrictions Weight Bearing Restrictions: No      Mobility Bed Mobility Overal bed mobility: Needs Assistance Bed Mobility: Supine to Sit     Supine to sit: Min assist          Transfers Overall transfer level: Needs assistance Equipment used: 1 person hand held assist Transfers: Sit to/from Stand, Bed to chair/wheelchair/BSC Sit to Stand: Min assist                  Balance Overall balance assessment: Needs assistance Sitting-balance support: Feet supported, Bilateral upper extremity supported Sitting balance-Leahy Scale: Good     Standing balance support: Bilateral upper extremity supported Standing balance-Leahy Scale: Fair                             ADL either performed or assessed with clinical judgement   ADL Overall ADL's : Needs assistance/impaired                     Lower Body Dressing: Minimal assistance;Sitting/lateral leans;Cueing for safety Lower Body Dressing Details (indicate cue type and reason): simulated Toilet Transfer: Minimal assistance Toilet Transfer Details (indicate cue type and reason): simulated with inital minA, progressing to close CGA Toileting- Clothing Manipulation and Hygiene: Minimal assistance;Contact guard assist;Sit to/from stand Toileting - Clothing Manipulation Details (indicate cue type and reason): urinal in standing with minA for gown/leads     Functional mobility during ADLs: Minimal assistance General ADL Comments: Pt functionally limited by fatigue/weakness. Movements are slow and deliberate and occassional support in sitting initally due to being in  the bed. Pt quickly progresses. Increased time needed, but overall close to baseline for ADLs (CGA for LB, setup/SBA for UB), minA for transfers      Pertinent Vitals/Pain Pain Assessment Pain Assessment: No/denies pain      Extremity/Trunk Assessment Upper Extremity Assessment Upper Extremity Assessment: Overall WFL for tasks assessed (Strength 4/5)   Lower Extremity Assessment Lower Extremity Assessment: Defer to PT evaluation (residual weakness from previous back surgery)       Communication Communication Communication: No apparent difficulties   Cognition Arousal: Alert Behavior During Therapy: WFL for tasks assessed/performed Overall Cognitive Status: Within Functional Limits for tasks assessed                                                  Home Living Family/patient expects to be discharged to:: Private residence Living Arrangements: Alone Available Help at Discharge: Family Type of Home: House Home Access: Stairs to enter Secretary/administrator of Steps: 3 Entrance Stairs-Rails: Right;Left;Can reach both Home Layout: One level     Bathroom Shower/Tub: Producer, television/film/video: Handicapped height Bathroom Accessibility: Yes How Accessible: Accessible via wheelchair;Accessible via walker Home Equipment: Shower seat;Grab bars - tub/shower;Grab bars - toilet;Hand held shower head;Wheelchair - Civil engineer, contracting: Sock aid;Reacher;Long-handled shoe horn;Long-handled sponge        Prior Functioning/Environment Prior Level of Function : Independent/Modified Independent             Mobility Comments: Mod indep for ADLs, household and community mobilization; manual WC (self-propels with LEs) as primary mobility. Does endorse ambulating some short-distances intermittently, but requires use of RW/4WRW for optimal safety.  Does endorse fall history, but none in recent 1-2 months. ADLs Comments: MOD I        OT Problem List: Decreased strength;Decreased range of motion;Impaired balance (sitting and/or standing);Decreased activity tolerance      OT Treatment/Interventions: Self-care/ADL training;Therapeutic  exercise;Neuromuscular education;DME and/or AE instruction;Therapeutic activities;Patient/family education;Balance training    OT Goals(Current goals can be found in the care plan section) Acute Rehab OT Goals OT Goal Formulation: With patient/family Time For Goal Achievement: 06/15/23 Potential to Achieve Goals: Good  OT Frequency: Min 1X/week       AM-PAC OT "6 Clicks" Daily Activity     Outcome Measure Help from another person eating meals?: None Help from another person taking care of personal grooming?: None Help from another person toileting, which includes using toliet, bedpan, or urinal?: A Little Help from another person bathing (including washing, rinsing, drying)?: A Little Help from another person to put on and taking off regular upper body clothing?: A Little Help from another person to put on and taking off regular lower body clothing?: A Little 6 Click Score: 20   End of Session Nurse Communication: Mobility status  Activity Tolerance: Patient tolerated treatment well Patient left: in bed;with family/visitor present;with call bell/phone within reach (MD in room)  OT Visit Diagnosis: Muscle weakness (generalized) (M62.81);Unsteadiness on feet (R26.81);Other abnormalities of gait and mobility (R26.89)                Time: 6578-4696 OT Time Calculation (min): 36 min Charges:  OT General Charges $OT Visit: 1 Visit OT Evaluation $OT Eval Moderate Complexity: 1 Mod OT Treatments $Self Care/Home Management : 8-22 mins  Mahira Petras L. Arielys Wandersee, OTR/L  06/01/23, 4:30 PM   Lise Auer  Chaselyn Nanney 06/01/2023, 4:26 PM

## 2023-06-01 NOTE — Discharge Summary (Signed)
Physician Discharge Summary   Francisco [name]  male DOB: [birth year]  ATF:[id number]  PCP: Lynnea Ferrier, MD  Admit date: 05/31/2023 Discharge date: 06/01/2023  Admitted From: home Disposition:  home Wife updated at bedside prior to discharge.  CODE STATUS: Full code  Discharge Instructions     Diet - low sodium heart healthy   Complete by: As directed       Hospital Course:  For full details, please see H&P, progress notes, consult notes and ancillary notes.  Briefly,  Francisco [name] is a 77 y.o. male with medical history significant of SVT and A-fib on Eliquis, Parkinson's disease, HTN,  HLD, COPD, PVD, dCHD, stroke, hypothyroidism, bilateral carotid artery stenosis (s/p of stent of right carotid artery), myasthenia gravis, Merkel cell carcinoma, who presented with rapid heart rate.   Patient states that he has history of Parkinson's disease.  He had follow-up visit with his neurologist Dr. Sherryll Burger who ordered an MRI for brain 2 days ago on 05/29/23, which was positive for stroke.  Pt was advised to go to ED. He was in the emergency department 2 days ago, but ultimately left the emergency department due to a prolonged wait.    Pt had follow up visit with his neurologist, Dr. Sherryll Burger and his PCP, Dr. Graciela Husbands today. His PCP found that he had rapid heart rate. Stat EKG showed SVT. This was discussed with Dr. Darrold Junker of cardiology, who recommended that pt go the emergency room. Upon arrival to ED, patient appears to be in SVT with HR around 165 bpm.  Patient was given 6 mg of adenosine in ED, converted to sinus rhythm, with heart rates 60-80s.  SVT (supraventricular tachycardia):   HR was up to 160s, converted to sinus rhythm after giving 6 mg of adenosine, with heart rate 60-80s currently.  Patient is asymptomatic. --Discussed with oncall cardio with [facility], who cleared pt for discharge with outpatient cardio f/u. --Pt was not clear what he takes at home, but according to home med  list, pt wasn't taking Toprol PTA, but is on Lopressor 12.5 mg BID, which is continued.   Paroxysmal atrial fibrillation (HCC) -Continue Eliquis --Pt was not clear what he takes at home, but according to home med list, pt wasn't taking Toprol PTA, but is on Lopressor 12.5 mg BID, which is continued.   Stroke [facility]):   MRI-brain on 05/29/23 showed punctate acute infarct in the right parietal lobe.  Patient is asymptomatic.  CTA of head and neck negative for LVO. --inpatient neuro consult was not obtained since pt already saw his outpatient neurologist Dr. Sherryll Burger PTA, and was instructed to continue plavix and Eliquis.  Cont Lipitor.  Pt is to f/u with Dr. Sherryll Burger as outpatient. --Pt did not appear to have new neurological deficits, and was cleared for discharge by PT.  Hyperkalemia:  K 5.3.  Normalized after Lokelma 5 g   Chronic diastolic CHF (congestive heart failure) (HCC): 2D echo on 12/30/2021 showed EF of 50-55%.  Patient has trace leg edema, no shortness breath or JVD.  CHF seem to be compensated. -Continue torsemide -home spironolactone to be resumed after discharge.   Obesity (BMI 30-39.9): Body weight 106.6 kg, BMI 33.72   COPD (chronic obstructive pulmonary disease) (HCC): Stable   Myasthenia gravis (HCC): -Continue home prednisone 10 mg on Monday/Wednesday/Friday -Continue Imuran   Bilateral carotid artery disease (HCC) -On Plavix, Eliquis and Lipitor   Hypothyroid -Synthroid   Essential hypertension --cont home regimen as below  Parkinson's disease -Sinemet   HLD (hyperlipidemia) -Lipitor   Unless noted above, medications under "STOP" list are ones pt was not taking PTA.  Discharge Diagnoses:  Principal Problem:   SVT (supraventricular tachycardia) Active Problems:   Paroxysmal atrial fibrillation (HCC)   Stroke (HCC)   Hyperkalemia   Chronic diastolic CHF (congestive heart failure) (HCC)   Obesity (BMI 30-39.9)   COPD (chronic obstructive pulmonary disease)  (HCC)   Myasthenia gravis (HCC)   Bilateral carotid artery disease (HCC)   Hypothyroid   Essential hypertension   Parkinson's disease   HLD (hyperlipidemia)     Discharge Instructions:  Allergies as of 06/01/2023       Reactions   Azithromycin Other (See Comments)   Avoid due to myasthenia gravis   Codeine Nausea And Vomiting        Medication List     STOP taking these medications    metoprolol succinate 25 MG 24 hr tablet Commonly known as: TOPROL-XL       TAKE these medications    atorvastatin 10 MG tablet Commonly known as: LIPITOR Take 1 tablet by mouth once daily   azaTHIOprine 50 MG tablet Commonly known as: IMURAN Take 150 mg by mouth daily.   carbidopa-levodopa 25-100 MG tablet Commonly known as: SINEMET IR Take 1 tablet by mouth 3 (three) times daily.   clopidogrel 75 MG tablet Commonly known as: PLAVIX Take 1 tablet by mouth once daily   D3-1000 25 MCG (1000 UT) capsule Generic drug: Cholecalciferol Take 1,000 Units by mouth daily.   Eliquis 5 MG Tabs tablet Generic drug: apixaban Take 5 mg by mouth 2 (two) times daily.   HYDROcodone-acetaminophen 10-325 MG tablet Commonly known as: NORCO Take 1-2 tablets by mouth See admin instructions. Take 1 to 2 tablets every morning, may take 1 tablet every 6 hours as needed for pain   levothyroxine 50 MCG tablet Commonly known as: SYNTHROID Take 1 tablet (50 mcg total) by mouth daily at 6 (six) AM.   metoprolol tartrate 25 MG tablet Commonly known as: LOPRESSOR Take 12.5 mg by mouth 2 (two) times daily.   predniSONE 10 MG tablet Commonly known as: DELTASONE Take 10 mg by mouth every other day. Taking 10 mg Monday, Wed., and Friday   pregabalin 50 MG capsule Commonly known as: LYRICA Take 50 mg by mouth 2 (two) times daily.   spironolactone 25 MG tablet Commonly known as: ALDACTONE Take 25 mg by mouth daily.   torsemide 20 MG tablet Commonly known as: DEMADEX Take 20 mg by mouth  daily.   traZODone 50 MG tablet Commonly known as: DESYREL Take 50 mg by mouth at bedtime as needed.         Follow-up Information     Lonell Face, MD Follow up in 1 week(s).   Specialty: Neurology Contact information: [phone] [facility] [address] [facility] [place] [address] [phone]         Marcina Millard, MD Follow up in 1 week(s).   Specialty: Cardiology Contact information: [address] [facility] [place] [address] [phone]                 Allergies  Allergen Reactions   Azithromycin Other (See Comments)    Avoid due to myasthenia gravis   Codeine Nausea And Vomiting     The results of significant diagnostics from this hospitalization (including imaging, microbiology, ancillary and laboratory) are listed below for reference.   Consultations:  Procedures/Studies: CT ANGIO HEAD NECK W WO CM  Result Date: 05/31/2023 CLINICAL DATA:  Punctate acute infarct on recent MRI, determine embolic source EXAM: CT ANGIOGRAPHY HEAD AND NECK WITH AND WITHOUT CONTRAST TECHNIQUE: Multidetector CT imaging of the head and neck was performed using the standard protocol during bolus administration of intravenous contrast. Multiplanar CT image reconstructions and MIPs were obtained to evaluate the vascular anatomy. Carotid stenosis measurements (when applicable) are obtained utilizing NASCET criteria, using the distal internal carotid diameter as the denominator. RADIATION DOSE REDUCTION: This exam was performed according to the departmental dose-optimization program which includes automated exposure control, adjustment of the mA and/or kV according to patient size and/or use of iterative reconstruction technique. CONTRAST:  75mL OMNIPAQUE IOHEXOL 350 MG/ML SOLN COMPARISON:  07/08/2020 CTA head and neck, correlation is also made with 05/29/2023 MRI head and 12/06/2022 CT head FINDINGS: CT HEAD FINDINGS Brain: No  evidence of acute infarct, hemorrhage, mass, mass effect, or midline shift. No hydrocephalus or extra-axial fluid collection. Vascular: No hyperdense vessel. Skull: Negative for fracture or focal lesion. Sinuses/Orbits: Mucosal thickening in the right maxillary sinus. Status post bilateral lens replacements. Other: The mastoid air cells are well aerated. CTA NECK FINDINGS Aortic arch: Standard branching. Imaged portion shows no evidence of aneurysm or dissection. Aortic atherosclerosis, which causes mild stenosis of the origin the left subclavian artery. Right carotid system: Interval placement of a stent extending from the distal common carotid artery into the proximal internal carotid artery. The stent appears patent, without evidence of in stent thrombosis. No evidence of stenosis, dissection, or occlusion in the right carotid system. The proximal ECA is patent. Left carotid system: No evidence of dissection, occlusion, or hemodynamically significant stenosis (greater than 50%). Retropharyngeal course of the proximal ICA. Vertebral arteries: No evidence of dissection, occlusion, or hemodynamically significant stenosis (greater than 50%). Skeleton: No acute osseous abnormality. Degenerative changes in the cervical spine. Status post ACDF C5-C7 and posterior fusion C3-C6. Other neck: Redemonstrated calcifications in the right parotid gland. Otherwise negative. Upper chest: No focal pulmonary opacity or pleural effusion. Review of the MIP images confirms the above findings CTA HEAD FINDINGS Anterior circulation: Both internal carotid arteries are patent to the termini, with calcification but without significant stenosis. Redemonstrated 3 mm posteriorly directed outpouching from distal ICA, near the terminus (series 10, image 96), which appears unchanged from the prior exam. This may represent an infundibulum at the origin the right posterior communicating artery, which is quite diminutive. A1 segments patent. Normal  anterior communicating artery. Anterior cerebral arteries are patent to their distal aspects without significant stenosis. No M1 stenosis or occlusion. MCA branches perfused to their distal aspects without significant stenosis. Posterior circulation: Vertebral arteries patent to the vertebrobasilar junction without significant stenosis. Posterior inferior cerebellar arteries patent proximally. Basilar artery is quite tortuous but patent to its distal aspect without significant stenosis. Superior cerebellar arteries patent proximally. Patent P1 segments. PCAs perfused to their distal aspects without significant stenosis. A diminutive right posterior communicating artery is seen. Venous sinuses: As permitted by contrast timing, patent. Anatomic variants: None significant. No evidence of aneurysm or vascular malformation. Review of the MIP images confirms the above findings IMPRESSION: 1. No acute intracranial process. 2. No intracranial large vessel occlusion or significant stenosis. 3. Interval placement of a stent across the right carotid bifurcation, which is patent. No hemodynamically significant stenosis in the neck. 4. Redemonstrated 3 mm posteriorly directed outpouching from the distal right ICA, which may represent an infundibulum at the origin  of the right posterior communicating artery versus a tiny aneurysm. Aortic Atherosclerosis (ICD10-I70.0). Electronically Signed   By: Wiliam Ke M.D.   On: 05/31/2023 20:30   DG Chest Port 1 View  Result Date: 05/31/2023 CLINICAL DATA:  Chest pain. EXAM: PORTABLE CHEST 1 VIEW COMPARISON:  12/06/2022. FINDINGS: Low lung volume. Bilateral lung fields are clear. Note is made of elevated right hemidiaphragm. Bilateral lateral costophrenic angles are clear. Stable cardio-mediastinal silhouette. No acute osseous abnormalities. Lower cervical spinal fixation hardware seen. The soft tissues are within normal limits. IMPRESSION: No active disease. Electronically Signed    By: Jules Schick M.D.   On: 05/31/2023 18:22   MR BRAIN WO CONTRAST  Result Date: 05/29/2023 CLINICAL DATA:  Parkinson's disease.  Follow-up. EXAM: MRI HEAD WITHOUT CONTRAST TECHNIQUE: Multiplanar, multiecho pulse sequences of the brain and surrounding structures were obtained without intravenous contrast. COMPARISON:  MRI brain 11/23/2021.  Head CT 12/06/2022. FINDINGS: Brain: Punctate acute infarct in the right parietal lobe (axial image 39 series 5). Unchanged old infarct in the right middle frontal gyrus. Stable background of mild chronic small-vessel disease. No hydrocephalus or extra-axial collection. No mass or abnormal enhancement. Few cortical foci of susceptibility. Vascular: Normal flow voids. Skull and upper cervical spine: Normal marrow signal. Sinuses/Orbits: No acute findings. Other: None. IMPRESSION: 1. Punctate acute infarct in the right parietal lobe. 2. Unchanged old infarct in the right middle frontal gyrus. 3. Few cortical foci of susceptibility, which can be seen in the setting of chronic hypertension. Critical Value/emergent results were called by telephone at the time of interpretation on 05/29/2023 at 3:09 pm to provider Louanna Raw, who discussed the case with Dr. Sherryll Burger and requested we send the patient to the emergency department. Electronically Signed   By: Orvan Falconer M.D.   On: 05/29/2023 15:14   CT ABDOMEN PELVIS W CONTRAST  Result Date: 05/13/2023 CLINICAL DATA:  Right lower quadrant abdominal pain for a few days, worsening today. Nausea. EXAM: CT ABDOMEN AND PELVIS WITH CONTRAST TECHNIQUE: Multidetector CT imaging of the abdomen and pelvis was performed using the standard protocol following bolus administration of intravenous contrast. RADIATION DOSE REDUCTION: This exam was performed according to the departmental dose-optimization program which includes automated exposure control, adjustment of the mA and/or kV according to patient size and/or use of iterative  reconstruction technique. CONTRAST:  OMNIPAQUE IOHEXOL 300 MG/ML  SOLN COMPARISON:  Noncontrast abdominopelvic CT 04/28/2022 FINDINGS: Lower chest: Asymmetric elevation of the right hemidiaphragm with associated chronic right greater than left lower lobe atelectasis or scarring, similar to previous study. No new airspace disease, pleural or pericardial effusion. The heart is mildly enlarged. There is atherosclerosis of the aorta and coronary arteries. Hepatobiliary: The liver is normal in density without suspicious focal abnormality. Possible mild intrahepatic biliary dilatation. No extrahepatic biliary dilatation identified. The gallbladder appears normal, without stones, wall thickening or surrounding inflammation. Pancreas: Unremarkable. No pancreatic ductal dilatation or surrounding inflammatory changes. Spleen: Normal in size without focal abnormality. Adrenals/Urinary Tract: Both adrenal glands appear normal. No evidence of urinary tract calculus, suspicious renal lesion or hydronephrosis. Stable chronic renal cortical thinning bilaterally. Stable simple cyst in the upper pole of the right kidney for which no follow-up imaging is recommended. Possible mild bladder wall thickening posteriorly on the left, similar to the previous study. Stomach/Bowel: No enteric contrast administered. The stomach appears unremarkable for its degree of distension. No evidence of bowel wall thickening, distention or surrounding inflammatory change. The appendix appears normal. Stable mild distal colonic  diverticulosis. Vascular/Lymphatic: There are no enlarged abdominal or pelvic lymph nodes. Aortic and branch vessel atherosclerosis without evidence of aneurysm or large vessel occlusion. Reproductive: Status post prostatectomy. Other: Stable postsurgical changes in the suprapubic anterior abdominal wall. No evidence of abdominal wall hernia, ascites or pneumoperitoneum. Musculoskeletal: No acute or significant osseous  findings. Multilevel spondylosis with postsurgical changes in the lumbar spine. Unless specific follow-up recommendations are mentioned in the findings or impression sections, no imaging follow-up of any mentioned incidental findings is recommended. IMPRESSION: 1. No acute findings or clear explanation for the patient's symptoms. The appendix appears normal. 2. Possible mild intrahepatic biliary dilatation. Correlate with liver function studies. 3. Stable chronic renal cortical thinning bilaterally. 4. Stable postsurgical changes in the suprapubic anterior abdominal wall. No evidence of abdominal wall hernia. 5. Stable asymmetric bladder wall thickening posteriorly on the left, nonspecific. 6. Stable chronic asymmetric elevation of the right hemidiaphragm with associated chronic right greater than left lower lobe atelectasis or scarring. 7. Coronary and aortic Atherosclerosis (ICD10-I70.0). Electronically Signed   By: Carey Bullocks M.D.   On: 05/13/2023 17:07      Labs: BNP (last 3 results) Recent Labs    05/31/23 1706  BNP 473.5*   Basic Metabolic Panel: Recent Labs  Lab 05/29/23 1822 05/31/23 1706 06/01/23 0510  NA 141 140 138  K 4.5 5.3* 3.9  CL 106 104 104  CO2 29 29 29   GLUCOSE 125* 138* 87  BUN 19 18 17   CREATININE 0.86 0.99 0.90  CALCIUM 8.8* 9.1 8.3*  MG  --  2.1  --    Liver Function Tests: Recent Labs  Lab 05/29/23 1822  AST 13*  ALT 6  ALKPHOS 54  BILITOT 0.6  PROT 6.7  ALBUMIN 3.9   No results for input(s): "LIPASE", "AMYLASE" in the last 168 hours. No results for input(s): "AMMONIA" in the last 168 hours. CBC: Recent Labs  Lab 05/29/23 1822 05/31/23 1706  WBC 5.6 6.2  NEUTROABS 4.9  --   HGB 10.5* 11.1*  HCT 33.4* 35.8*  MCV 113.6* 115.1*  PLT 223 232   Cardiac Enzymes: No results for input(s): "CKTOTAL", "CKMB", "CKMBINDEX", "TROPONINI" in the last 168 hours. BNP: Invalid input(s): "POCBNP" CBG: No results for input(s): "GLUCAP" in the last 168  hours. D-Dimer No results for input(s): "DDIMER" in the last 72 hours. Hgb A1c No results for input(s): "HGBA1C" in the last 72 hours. Lipid Profile Recent Labs    06/01/23 0509  CHOL 167  HDL 59  LDLCALC 86  TRIG 112  CHOLHDL 2.8   Thyroid function studies Recent Labs    05/31/23 1706  TSH 0.989   Anemia work up No results for input(s): "VITAMINB12", "FOLATE", "FERRITIN", "TIBC", "IRON", "RETICCTPCT" in the last 72 hours. Urinalysis    Component Value Date/Time   COLORURINE YELLOW (A) 05/13/2023 1409   APPEARANCEUR CLEAR (A) 05/13/2023 1409   LABSPEC 1.025 05/13/2023 1409   PHURINE 5.5 05/13/2023 1409   GLUCOSEU NEGATIVE 05/13/2023 1409   HGBUR NEGATIVE 05/13/2023 1409   BILIRUBINUR NEGATIVE 05/13/2023 1409   KETONESUR 15 (A) 05/13/2023 1409   PROTEINUR NEGATIVE 05/13/2023 1409   NITRITE NEGATIVE 05/13/2023 1409   LEUKOCYTESUR NEGATIVE 05/13/2023 1409   Sepsis Labs Recent Labs  Lab 05/29/23 1822 05/31/23 1706  WBC 5.6 6.2   Microbiology No results found for this or any previous visit (from the past 240 hour(s)).   Total time spend on discharging this patient, including the last patient exam, discussing the hospital  stay, instructions for ongoing care as it relates to all pertinent caregivers, as well as preparing the medical discharge records, prescriptions, and/or referrals as applicable, is 45 minutes.    Darlin Priestly, MD  [facility] 06/01/2023, 9:59 AM

## 2023-06-01 NOTE — Evaluation (Signed)
Physical Therapy Evaluation Patient Details Name: Francisco [name] MRN: [id number] DOB: March 27, 1946 Today's Date: 06/01/2023  History of Present Illness  Francisco [name] is a 77 y.o. male with medical history significant of SVT and A-fib on Eliquis, Parkinson's disease, HTN,  HLD, COPD, PVD, dCHD, stroke, hypothyroidism, bilateral carotid artery stenosis (s/p of stent of right carotid artery), myasthenia gravis,  Merkel cell carcinoma, laminectomy in 2022, who presents with rapid heart rate. MRI + for acute infarct in R parietal lobe. Pt followed up with neuro/PCP which found rapid heart rate, stat EKG showed SVT (broken with adenosine in ED)  Clinical Impression  Patient received from OT, supine in bed; wife present at bedside.  Patient alert and oriented, follows commands and agreeable to participation with treatment session.  Denies pain; denies acute changes in strength, sensation or overall mobility with recent diagnoses/medical concerns.  Baseline weakness to L UE < LE noted with isolated testing (see details below), but no additional focal, acute changes appreciated.  Able to complete bed mobility with min assist; sit/stand, stand pivot transfer between seating surfaces with cga/close sup.  Optimal performance with environment set up to simulate home/normal environment; completes with cga/close sup.  Does require bilat UE support throughout task for balance and external stabilization; very slow and deliberate, but no buckling, LOB or safety concern.  Per patient/wife, transfer ability at/near baseline for him. Higher level balance deficits appreciated, but patient with good insight/awareness, and good use of compensatory strategies as appropriate. Of note, HR 60-70's at rest; 90s with transfers and mobility.  Recovers appropriately; denies chest pain, pressure or adverse symptoms. Would benefit from skilled PT to address above deficits and promote optimal return to PLOF.; recommend post-acute PT  follow up as indicated by interdisciplinary care team.          If plan is discharge home, recommend the following: A little help with bathing/dressing/bathroom;A little help with walking and/or transfers   Can travel by private vehicle        Equipment Recommendations None recommended by PT  Recommendations for Other Services       Functional Status Assessment Patient has had a recent decline in their functional status and demonstrates the ability to make significant improvements in function in a reasonable and predictable amount of time.     Precautions / Restrictions Precautions Precautions: Fall Restrictions Weight Bearing Restrictions: No      Mobility  Bed Mobility Overal bed mobility: Needs Assistance Bed Mobility: Supine to Sit     Supine to sit: Min assist          Transfers Overall transfer level: Needs assistance   Transfers: Sit to/from Stand, Bed to chair/wheelchair/BSC Sit to Stand: Contact guard assist Stand pivot transfers: Contact guard assist         General transfer comment: optimal performance with environment set up to simulate home/normal environment; completes with cga/close sup.  Does require bilat UE support throughout task for balance and external stabilization; very slow and deliberate, but no buckling, LOB or safety concern.  Per patient/wife, transfer ability at/near baseline for him    Ambulation/Gait               General Gait Details: manual WC as primary mobility  Stairs            Wheelchair Mobility     Tilt Bed    Modified Rankin (Stroke Patients Only)       Balance Overall balance assessment: Needs assistance Sitting-balance support:  No upper extremity supported, Feet supported Sitting balance-Leahy Scale: Good     Standing balance support: Bilateral upper extremity supported Standing balance-Leahy Scale: Fair                               Pertinent Vitals/Pain Pain  Assessment Pain Assessment: No/denies pain    Home Living Family/patient expects to be discharged to:: Private residence Living Arrangements: Alone Available Help at Discharge: Family Type of Home: House Home Access: Stairs to enter Entrance Stairs-Rails: Right;Left;Can reach both Secretary/administrator of Steps: 3   Home Layout: One level        Prior Function Prior Level of Function : Independent/Modified Independent             Mobility Comments: Mod indep for ADLs, household and community mobilization; manual WC (self-propels with LEs) as primary mobility. Does endorse ambulating some short-distances intermittently, but requires use of RW/4WRW for optimal safety.  Does endorse fall history, but none in recent 1-2 months.       Extremity/Trunk Assessment   Upper Extremity Assessment Upper Extremity Assessment:  (baseline paresthesia L UE, digits 1-3 (from previous back surgery); otherwise, no acute issues.  Strength grossly 4/5 throughout)    Lower Extremity Assessment Lower Extremity Assessment:  (R LE grossly 4+/5, L L@ grossly 3+ to 4-/5 (residual from previous back surgery); denies numbness, parethesia)       Communication      Cognition Arousal: Alert Behavior During Therapy: WFL for tasks assessed/performed Overall Cognitive Status: Within Functional Limits for tasks assessed                                          General Comments      Exercises     Assessment/Plan    PT Assessment Patient needs continued PT services  PT Problem List Decreased strength;Decreased range of motion;Decreased activity tolerance;Decreased balance;Decreased mobility;Cardiopulmonary status limiting activity       PT Treatment Interventions DME instruction;Gait training;Stair training;Functional mobility training;Therapeutic activities;Therapeutic exercise;Balance training    PT Goals (Current goals can be found in the Care Plan section)  Acute Rehab PT  Goals Patient Stated Goal: to go back home PT Goal Formulation: With patient/family Time For Goal Achievement: 06/15/23 Potential to Achieve Goals: Good    Frequency Min 1X/week     Co-evaluation               AM-PAC PT "6 Clicks" Mobility  Outcome Measure Help needed turning from your back to your side while in a flat bed without using bedrails?: None Help needed moving from lying on your back to sitting on the side of a flat bed without using bedrails?: A Little Help needed moving to and from a bed to a chair (including a wheelchair)?: A Little Help needed standing up from a chair using your arms (e.g., wheelchair or bedside chair)?: A Little Help needed to walk in hospital room?: A Little Help needed climbing 3-5 steps with a railing? : A Little 6 Click Score: 19    End of Session Equipment Utilized During Treatment: Gait belt Activity Tolerance: Patient tolerated treatment well;Patient limited by fatigue;No increased pain Patient left: in chair;with call bell/phone within reach;with family/visitor present Nurse Communication: Mobility status PT Visit Diagnosis: Muscle weakness (generalized) (M62.81);Difficulty in walking, not elsewhere classified (R26.2)  Time: 0940-1000 PT Time Calculation (min) (ACUTE ONLY): 20 min   Charges:   PT Evaluation $PT Eval Moderate Complexity: 1 Mod   PT General Charges $$ ACUTE PT VISIT: 1 Visit       Darlynn Ricco H. Manson Passey, PT, DPT, NCS 06/01/23, 10:57 AM [phone]

## 2023-06-06 ENCOUNTER — Ambulatory Visit: Payer: Medicare Other

## 2023-06-08 ENCOUNTER — Ambulatory Visit: Payer: Medicare Other

## 2023-06-08 DIAGNOSIS — R2681 Unsteadiness on feet: Secondary | ICD-10-CM

## 2023-06-08 DIAGNOSIS — R262 Difficulty in walking, not elsewhere classified: Secondary | ICD-10-CM | POA: Diagnosis not present

## 2023-06-08 DIAGNOSIS — M545 Low back pain, unspecified: Secondary | ICD-10-CM

## 2023-06-08 DIAGNOSIS — M6281 Muscle weakness (generalized): Secondary | ICD-10-CM

## 2023-06-08 DIAGNOSIS — R2689 Other abnormalities of gait and mobility: Secondary | ICD-10-CM

## 2023-06-08 NOTE — Therapy (Signed)
OUTPATIENT PHYSICAL THERAPY TREATMENT  Patient Name: Francisco [name] MRN: [id number] DOB:02/25/1946, 77 y.o., male Today's Date: 06/09/2023  PCP: Lynnea Ferrier, MD REFERRING PROVIDER: Venetia Night MD   PT End of Session - 06/08/23 1413     Visit Number 78    Number of Visits 100    Date for PT Re-Evaluation 07/25/23    Authorization Type [employer] Medicare    Progress Note Due on Visit 80    PT Start Time 1405    PT Stop Time 1445    PT Time Calculation (min) 40 min    Equipment Utilized During Treatment Gait belt    Activity Tolerance Patient tolerated treatment well;Patient limited by fatigue;No increased pain    Behavior During Therapy WFL for tasks assessed/performed                       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   Carotid stenosis    CHF (congestive heart failure) (HCC)    CKD (chronic kidney disease)    CKD (chronic kidney disease) stage 3, GFR 30-59 ml/min (HCC)    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   DDD (degenerative disc disease), cervical    Dysrhythmia    post carotid stent bradycardia; PAF 09/2020   GERD (gastroesophageal reflux disease)    Hypercholesterolemia    Hypertension    Hypothyroidism    pt takes Levothyroxine daily   Lumbosacral spinal stenosis    Myasthenia gravis, adult form (HCC)    PAD (peripheral artery disease) (HCC)    Parkinson disease    Shortness of breath    Lung MD- Dr Joetta Manners   Sleep apnea    do not use CPAP every night   Stroke (HCC) 05/31/2023   Past Surgical History:  Procedure Laterality Date   ANTERIOR CERVICAL DECOMP/DISCECTOMY FUSION  07/18/2011   Procedure: ANTERIOR CERVICAL  DECOMPRESSION/DISCECTOMY FUSION 2 LEVELS;  Surgeon: Kathaleen Maser Pool;  Location: [facility];  Service: Neurosurgery;  Laterality: N/A;  cervical five-six, cervical six-seven anterior cervical discectomy and fusion   ATRIAL FIBRILLATION ABLATION     BACK SURGERY     in 1985 [facility]   BILATERAL CARPAL TUNNEL RELEASE     01/2020 Right, 04/2020 Left   CARDIAC CATHETERIZATION     2005 at [facility], no stents   CAROTID PTA/STENT INTERVENTION N/A 09/17/2020   Procedure: CAROTID PTA/STENT INTERVENTION;  Surgeon: Annice Needy, MD;  Location: [facility];  Service: Cardiovascular;  Laterality: N/A;   CATARACT EXTRACTION W/PHACO Left 01/06/2020   Procedure: CATARACT EXTRACTION PHACO AND INTRAOCULAR LENS PLACEMENT (IOC) ISTENT INJ LEFT 3.81  00:33.3;  Surgeon: Nevada Crane, MD;  Location: [facility];  Service: Ophthalmology;  Laterality: Left;   CATARACT EXTRACTION W/PHACO Right 02/03/2020   Procedure: CATARACT EXTRACTION PHACO AND INTRAOCULAR LENS PLACEMENT (IOC) RIGHT ISTENT INJ;  Surgeon: Nevada Crane, MD;  Location: [facility];  Service: Ophthalmology;  Laterality: Right;  4.29 0:35.6   COLONOSCOPY     HERNIA REPAIR Left    inguinal hernia repair in 1985  LUMBAR LAMINECTOMY/DECOMPRESSION MICRODISCECTOMY Left 02/24/2014   Procedure: LUMBAR LAMINECTOMY/DECOMPRESSION MICRODISCECTOMY LUMBAR THREE-FOUR, FOUR-FIVE, LEFT FIVE-SACRAL ONE ;  Surgeon: Temple Pacini, MD;  Location: [facility];  Service: Neurosurgery;  Laterality: Left;  LUMBAR LAMINECTOMY/DECOMPRESSION MICRODISCECTOMY LUMBAR THREE-FOUR, FOUR-FIVE, LEFT FIVE-SACRAL ONE    LUMBAR LAMINECTOMY/DECOMPRESSION MICRODISCECTOMY N/A 05/03/2021   Procedure: Laminectomy and Foraminotomy - L2-L3;  Surgeon: Julio Sicks, MD;  Location: [facility] OR;  Service: Neurosurgery;  Laterality: N/A;  3C   POSTERIOR CERVICAL FUSION/FORAMINOTOMY N/A 08/07/2020   Procedure: C3-6 POSTERIOR FUSION WITH DECOMPRESSION;  Surgeon: Venetia Night, MD;  Location: [facility] ORS;  Service: Neurosurgery;  Laterality: N/A;   PROSTATECTOMY  04/2013   [facility] Dr Joycelyn Das    Patient Active Problem List   Diagnosis Date Noted   Stroke [facility]) 05/31/2023   Obesity (BMI 30-39.9) 05/31/2023   HLD (hyperlipidemia) 05/31/2023   Chronic diastolic CHF (congestive heart failure) (HCC) 05/31/2023   Hyperkalemia 05/31/2023   SVT (supraventricular tachycardia) 12/30/2021   Hypothyroidism 12/30/2021   Parkinson's disease 12/30/2021   Elevated troponin 12/30/2021   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   Acquired thrombophilia (HCC) 01/13/2021   History of decompression of median nerve 01/01/2021   Paroxysmal atrial fibrillation (HCC) 10/06/2020   Carotid stenosis, right 09/17/2020   S/P cervical spinal fusion    Leukocytosis    Essential hypertension    Anemia of chronic disease    Postoperative pain    Neuropathic pain    Cervical myelopathy (HCC) 08/07/2020   Preop cardiovascular exam 07/17/2020   SOB (shortness of breath) on exertion 07/17/2020   Leg weakness, bilateral 07/13/2020   Aortic atherosclerosis (HCC) 07/09/2020   Body mass index (BMI) 34.0-34.9, adult 12/25/2019   Myalgia 10/30/2019   Lumbar post-laminectomy syndrome 10/24/2019   PAD (peripheral artery disease) (HCC) 06/06/2019   CKD (chronic kidney disease) stage 3, GFR 30-59 ml/min (HCC) 04/29/2019   B12 deficiency 01/23/2019   Left arm numbness 02/28/2018   Neck pain 02/28/2018   Anemia 02/02/2017   DDD (degenerative disc disease), cervical 02/02/2017   Hypothyroid 02/02/2017   MRSA (methicillin resistant staph aureus) culture positive 02/02/2017   Nocturnal hypoxia 02/02/2017   Senile purpura (HCC) 10/03/2016   Essential hypertension, benign 09/16/2016   Bilateral carotid artery disease (HCC) 09/16/2016   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/18/2016   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   History of prostate cancer 12/21/2015   Left carpal  tunnel syndrome 11/11/2015   Kidney stone on left side 07/05/2015   Myasthenia gravis (HCC) 05/26/2015   Radiculitis 01/05/2015   Long-term use of high-risk medication 10/15/2014   Persistent cough 09/10/2014   Pure hypercholesterolemia 07/25/2014   Spinal stenosis, lumbar region, with neurogenic claudication 02/24/2014   Lumbosacral stenosis with neurogenic claudication 02/24/2014   COPD (chronic obstructive pulmonary disease) (HCC) 01/22/2014    REFERRING DIAG: balance disorder   THERAPY DIAG:  Difficulty in walking, not elsewhere classified  Muscle weakness (generalized)  Unsteadiness on feet  Other abnormalities of gait and mobility  Chronic bilateral low back pain, unspecified whether sciatica present  Rationale for Evaluation and Treatment Rehabilitation  PERTINENT HISTORY: Maximiliano Muramoto is a 77 year old male referred to OPPT neuro for difficulty with balance. He was also just recently dx in May 2023 with Impingement syndrome of left shoulder (M75.42) Impingement syndrome of left shoulder (primary encounter diagnosis) Left shoulder pain, unspecified chronicity Nontraumatic rupture of left proximal biceps tendon Past medical hx includes Lumbar laminectomy 04/2021. C3-6 decompression, fusion on  11/26, myasthenia gravis, 2012 ACDF 5/6, 6/7; OSA, hypothyroidism, HTN, COPD, multiple back surgeries, 2021 carpal tunnel release.  PRECAUTIONS: Fall  SUBJECTIVE: Patient reports going to ED twice last week - had SVT and was told he had a stroke in recent past. He reports he did not feel any different- Did go see his MD in between   PAIN:  Are you having pain? R arm pain and R back/leg pain   TODAY'S TREATMENT:    06/08/2023  Quick reassessment since patient dx of CVA last week with 2 separate ED visits.  2-/5 left hip flex; 3+/5 right hip flex 2-/5 left knee flex and ext; 3+/5 right knee flex ext  HR= 78 bpm and O2 sat= 91% up to 96% with pursed lip breath- (no obvious SOB)   Therex:   Ambulation with rollator x 160 feet (no shuffling)  Sit to stand from EOM at raises height approx 25" x 3, 23 in x 10, 21 x 6 reps Seated ham curl with GTB 2 sets of 10 reps  Seated knee ext  x 12 reps each LE Seated hip flex x 12 reps each LE  Ambulation with rollator x 160 feet (no shuffling)  HR= 84 bpm    Pt educated throughout session about proper posture and technique with exercises. Improved exercise technique, movement at target joints, use of target muscles after min to mod verbal, visual, tactile cues.   PATIENT EDUCATION: Education details: Goals, plan, exercise technique, body mechanics, energy conservation techniques  Person educated: Patient Education method: Explanation, Demonstration, Tactile cues, Verbal cues, and Handouts Education comprehension: verbalized understanding, returned demonstration, verbal cues required, and tactile cues required   HOME EXERCISE PROGRAM:  No Updates today  Access Code: [email or web address] URL: [email or web address] Date: 03/01/2022 Prepared by: Precious Bard  Exercises - Seated March  - 1 x daily - 7 x weekly - 2 sets - 10 reps - 5 hold - Seated Long Arc Quad  - 1 x daily - 7 x weekly - 2 sets - 10 reps - 5 hold - Seated Hip Abduction  - 1 x daily - 7 x weekly - 2 sets - 10 reps - 5 hold - Seated Hip Adduction Isometrics with Ball  - 1 x daily - 7 x weekly - 2 sets - 10 reps - 5 hold - Seated Gluteal Sets  - 1 x daily - 7 x weekly - 2 sets - 10 reps - 5 hold - Seated Heel Toe Raises  - 1 x daily - 7 x weekly - 2 sets - 10 reps - 5 hold LE strength and balance      PT Short Term Goals       PT SHORT TERM GOAL #1   Title Pt will be independent with initial HEP in order to improve strength and balance in order to decrease fall risk and improve function at home and work.    Baseline 02/23/2022- Patient reports not doing much as far as exercise in the home currently. 04/20/2022- Patient reports compliant with HEP and  no questions at this time.; 9/7 pt indep   Time 6    Period Weeks    Status Goal met   Target Date 04/06/22              PT Long Term Goals       PT LONG TERM GOAL #1   Title Pt will be independent with final HEP in order to improve strength and balance in order to  decrease fall risk and improve function at home and work. 04/20/2022- Patient reports compliant with HEP and no questions at this time.    Baseline 02/23/2022= No formal HEP in place. 9/7: pt performing at least once a day, feels comfortable   Time 12    Period Weeks    Status GOAL MET   Target Date 08/11/2022      PT LONG TERM GOAL #2   Title Pt will decrease 5TSTS by at least 8 seconds in order to demonstrate clinically significant improvement in LE strength.    Baseline 02/23/2022= 31.52 sec with B hands on knees; 04/20/2022= 16.08 sec with B hands on knees   Time 12    Period Days    Status Goal Met   Target Date 05/18/22      PT LONG TERM GOAL #3   Title Pt will increase by at least 0.23 m/s in order to demonstrate clinically significant improvement in community ambulation.    Baseline 02/23/2022= 0.39 m/s using RW; 0.45 m/s; 9/7: 0.37 m/s with QC, 0.74 m/s with RW   Time 12    Period Weeks    Status GOAL MET   Target Date 05/18/22      PT LONG TERM GOAL #4   Title Pt will improve FOTO to target score of 50 to display perceived improvements in ability to complete ADL's.    Baseline 02/23/2022= 47; 9/7: 49; 11/30=54%   Time 12    Period Weeks    Status GOAL MET   Target Date 08/11/2022      PT LONG TERM GOAL #5   Title Pt will decrease TUG to below 20 seconds/decrease in order to demonstrate decreased fall risk.    Baseline 02/23/2022=28.27 sec with RW; 04/20/2022= 20.22 with SBQC; 9/7: 14.4 sec with RW, 18.5 sec with SBQC   Time 12    Period Weeks    Status MET   Target Date 05/18/22    PT LONG TERM GOAL #6  Title Pt will decrease TUG to 14 sec or below with SBQC in order to demonstrate decreased  fall risk.   Baseline  9/7: 14.4 sec with RW, 18.5 sec with SBQC; 11/30=16.85 sec without an AD. 09/15/2022= 22.14 sec with SPC; 11/08/2022= 25.17 sec without and AD and 21.22 sec with SPC. 01/31/2023= 22.64 sec without a AD and 24.64 sec with SBQC;  03/09/2023= 22.04 sec with SBQC and 14.88 sec with 4WW.  05/02/2023= 20.46 sec with SBQC and 14.22 sec  Time 12   Period Weeks   Status ONGOING  Target Date 07/25/2023   PT LONG TERM GOAL #7  Title Patient will increase six minute walk test distance to >700 ft for improved gait ability and increased ease with community participation.  Baseline 9/7: completes 4 minutes and 444 ft with RW. Test discontinued at 4 minutes due to fatgiue. 08/11/2022= patient ambulated 200 feet yet required 1 seated rest break- due to BLE fatigue- using SBQC: 09/15/2022= Patient ambulated 300 feet in 4 min 20 sec with 1 seated rest break using SBQC. 11/08/2022 = 175 feet- stopped due to exhausted today- 2 min 10 sec; 01/17/2023= 200 feet in 2 min 38 sec; 02/02/2023= 160 feet with use of SBQC in 3 min 45 sec prior to stopping. 6/27 430 feet in 40 sec; 04/25/2023= 6 min complete - 500 feet using 4WW  Time 12   Period Weeks   Status ONGOING  Target Date 07/25/2023   PT LONG TERM GOAL #8  Title Pt will decrease 5TSTS by at least 2 seconds (without UE support)  in order to demonstrate clinically significant improvement in LE strength.   Baseline 08/11/2022= 16.33 sec with B hands on knees; 09/15/2022= 22 sec with B hands on knees (patient reports increased weakness since around Christmas and just has not bounced back); 11/08/2022= 18.85 without UE Support; 01/17/2023=30.0 sec without UE support- patient reported not having a good day but tried his best. 02/02/2023- 17.74 sec with UE Support; 03/09/2023 =15.82 sec without UE support; 04/25/2023= 15.77 sec  Time 12   Period Weeks  Status ONGOING  Target Date 07/25/2023       PT LONG TERM GOAL #9  Title Pt will improve BERG by at least 3  points in order to demonstrate clinically significant improvement in balance.   Baseline 11/08/2022= 33/56; 01/17/2023= 45/56; 02/02/2023=45/56; 05/02/2023=45/56  Time 12   Period Weeks  Status ONGOING  Target Date 07/25/2023       Plan -     Clinical Impression Statement Patient returns today after missing last week due to 2 trips to ED and having some cardiac issues. He was also told at some point recently he had a stroke. He reports feeling no differently and presents about the same as he was 2 weeks ago. He performed fairly well with prescribed exercises and no heart rate issues or increased pain.  Patient will continue to benefit from skilled PT services to address deficits and impairment identified in evaluation in order to maximize independence and safety in basic mobility required for performance of ADL, IADL, and leisure.    Personal Factors and Comorbidities Age;Past/Current Experience;Time since onset of injury/illness/exacerbation;Comorbidity 3+    Comorbidities COPD, History of Cancer, myasthenia gravis, chronic lumbar surgical history    Examination-Activity Limitations Lift;Squat;Bend;Stairs;Stand;Transfers;Probation officer;Bathing;Hygiene/Grooming;Dressing;Toileting;Bed Mobility;Caring for Others;Continence    Examination-Participation Restrictions Yard Work;Church;Cleaning;Driving;Community Activity    Stability/Clinical Decision Making Evolving/Moderate complexity    Rehab Potential Good    Clinical Impairments Affecting Rehab Potential multipe comorbidities    PT Frequency 1-2x / week    PT Duration 12 weeks    PT Treatment/Interventions ADLs/Self Care Home Management;Electrical Stimulation;Moist Heat;Traction;Ultrasound;Gait training;Stair training;Functional mobility training;Therapeutic activities;Therapeutic exercise;Balance training;Neuromuscular re-education;Patient/family education;Manual techniques;Passive range of motion;Dry needling;Joint  Manipulations;Cryotherapy;Vestibular;Canalith Repostioning    PT Next Visit Plan Progress LE strengthening, balance training,  and functional endurance.   PT Home Exercise Plan standing strengthening and balance, seated strengthening and coordination.    Consulted and Agree with Plan of Care Patient            8:10 AM, 06/09/23    Lenda Kelp PT Physical Therapist - [facility]  Outpatient Physical Therapy- Main Campus [phone]     06/09/23, 8:10 AM

## 2023-06-13 ENCOUNTER — Ambulatory Visit: Payer: Medicare Other

## 2023-06-20 ENCOUNTER — Other Ambulatory Visit (INDEPENDENT_AMBULATORY_CARE_PROVIDER_SITE_OTHER): Payer: Self-pay | Admitting: Vascular Surgery

## 2023-06-20 ENCOUNTER — Ambulatory Visit: Payer: Medicare Other | Attending: Neurosurgery

## 2023-06-20 ENCOUNTER — Encounter: Payer: Self-pay | Admitting: Family

## 2023-06-20 DIAGNOSIS — I6523 Occlusion and stenosis of bilateral carotid arteries: Secondary | ICD-10-CM

## 2023-06-20 DIAGNOSIS — R2681 Unsteadiness on feet: Secondary | ICD-10-CM | POA: Insufficient documentation

## 2023-06-20 DIAGNOSIS — G8929 Other chronic pain: Secondary | ICD-10-CM | POA: Diagnosis present

## 2023-06-20 DIAGNOSIS — R278 Other lack of coordination: Secondary | ICD-10-CM | POA: Insufficient documentation

## 2023-06-20 DIAGNOSIS — M6281 Muscle weakness (generalized): Secondary | ICD-10-CM | POA: Diagnosis present

## 2023-06-20 DIAGNOSIS — G959 Disease of spinal cord, unspecified: Secondary | ICD-10-CM | POA: Insufficient documentation

## 2023-06-20 DIAGNOSIS — R262 Difficulty in walking, not elsewhere classified: Secondary | ICD-10-CM | POA: Diagnosis present

## 2023-06-20 DIAGNOSIS — R269 Unspecified abnormalities of gait and mobility: Secondary | ICD-10-CM | POA: Diagnosis present

## 2023-06-20 DIAGNOSIS — R2689 Other abnormalities of gait and mobility: Secondary | ICD-10-CM | POA: Diagnosis present

## 2023-06-20 DIAGNOSIS — M545 Low back pain, unspecified: Secondary | ICD-10-CM | POA: Diagnosis present

## 2023-06-20 DIAGNOSIS — M5442 Lumbago with sciatica, left side: Secondary | ICD-10-CM | POA: Diagnosis present

## 2023-06-20 NOTE — Therapy (Signed)
OUTPATIENT PHYSICAL THERAPY TREATMENT  Patient Name: Francisco [name] MRN: [id number] DOB:1946/08/02, 77 y.o., male Today's Date: 06/21/2023  PCP: Lynnea Ferrier, MD REFERRING PROVIDER: Venetia Night MD   PT End of Session - 06/20/23 1413     Visit Number 79    Number of Visits 100    Date for PT Re-Evaluation 07/25/23    Authorization Type [employer] Medicare    Progress Note Due on Visit 80    PT Start Time 1403    PT Stop Time 1443    PT Time Calculation (min) 40 min    Equipment Utilized During Treatment Gait belt    Activity Tolerance Patient tolerated treatment well;Patient limited by fatigue;No increased pain    Behavior During Therapy WFL for tasks assessed/performed                       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   Carotid stenosis    CHF (congestive heart failure) (HCC)    CKD (chronic kidney disease)    CKD (chronic kidney disease) stage 3, GFR 30-59 ml/min (HCC)    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   DDD (degenerative disc disease), cervical    Dysrhythmia    post carotid stent bradycardia; PAF 09/2020   GERD (gastroesophageal reflux disease)    Hypercholesterolemia    Hypertension    Hypothyroidism    pt takes Levothyroxine daily   Lumbosacral spinal stenosis    Myasthenia gravis, adult form (HCC)    PAD (peripheral artery disease) (HCC)    Parkinson disease (HCC)    Shortness of breath    Lung MD- Dr Joetta Manners   Sleep apnea    do not use CPAP every night   Stroke (HCC) 05/31/2023   Past Surgical History:  Procedure Laterality Date   ANTERIOR CERVICAL DECOMP/DISCECTOMY FUSION  07/18/2011   Procedure: ANTERIOR  CERVICAL DECOMPRESSION/DISCECTOMY FUSION 2 LEVELS;  Surgeon: Kathaleen Maser Pool;  Location: [facility];  Service: Neurosurgery;  Laterality: N/A;  cervical five-six, cervical six-seven anterior cervical discectomy and fusion   ATRIAL FIBRILLATION ABLATION     BACK SURGERY     in 1985 [facility]   BILATERAL CARPAL TUNNEL RELEASE     01/2020 Right, 04/2020 Left   CARDIAC CATHETERIZATION     2005 at [facility], no stents   CAROTID PTA/STENT INTERVENTION N/A 09/17/2020   Procedure: CAROTID PTA/STENT INTERVENTION;  Surgeon: Annice Needy, MD;  Location: [facility];  Service: Cardiovascular;  Laterality: N/A;   CATARACT EXTRACTION W/PHACO Left 01/06/2020   Procedure: CATARACT EXTRACTION PHACO AND INTRAOCULAR LENS PLACEMENT (IOC) ISTENT INJ LEFT 3.81  00:33.3;  Surgeon: Nevada Crane, MD;  Location: [facility];  Service: Ophthalmology;  Laterality: Left;   CATARACT EXTRACTION W/PHACO Right 02/03/2020   Procedure: CATARACT EXTRACTION PHACO AND INTRAOCULAR LENS PLACEMENT (IOC) RIGHT ISTENT INJ;  Surgeon: Nevada Crane, MD;  Location: [facility];  Service: Ophthalmology;  Laterality: Right;  4.29 0:35.6   COLONOSCOPY     HERNIA REPAIR Left    inguinal hernia repair  in 1985   LUMBAR LAMINECTOMY/DECOMPRESSION MICRODISCECTOMY Left 02/24/2014   Procedure: LUMBAR LAMINECTOMY/DECOMPRESSION MICRODISCECTOMY LUMBAR THREE-FOUR, FOUR-FIVE, LEFT FIVE-SACRAL ONE ;  Surgeon: Temple Pacini, MD;  Location: [facility];  Service: Neurosurgery;  Laterality: Left;  LUMBAR LAMINECTOMY/DECOMPRESSION MICRODISCECTOMY LUMBAR THREE-FOUR, FOUR-FIVE, LEFT FIVE-SACRAL ONE    LUMBAR LAMINECTOMY/DECOMPRESSION MICRODISCECTOMY N/A 05/03/2021   Procedure: Laminectomy and Foraminotomy - L2-L3;  Surgeon: Julio Sicks, MD;  Location: [facility];  Service: Neurosurgery;  Laterality: N/A;  3C   POSTERIOR CERVICAL FUSION/FORAMINOTOMY N/A 08/07/2020   Procedure: C3-6 POSTERIOR FUSION WITH DECOMPRESSION;  Surgeon:  Venetia Night, MD;  Location: [facility] OR[facility];  Service: Neurosurgery;  Laterality: N/A;   PROSTATECTOMY  04/2013   [facility] Dr Joycelyn Das    Patient Active Problem List   Diagnosis Date Noted   Stroke [facility]) 05/31/2023   Obesity (BMI 30-39.9) 05/31/2023   HLD (hyperlipidemia) 05/31/2023   Chronic diastolic CHF (congestive heart failure) (HCC) 05/31/2023   Hyperkalemia 05/31/2023   SVT (supraventricular tachycardia) (HCC) 12/30/2021   Hypothyroidism 12/30/2021   Parkinson's disease (HCC) 12/30/2021   Elevated troponin 12/30/2021   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   Acquired thrombophilia (HCC) 01/13/2021   History of decompression of median nerve 01/01/2021   Paroxysmal atrial fibrillation (HCC) 10/06/2020   Carotid stenosis, right 09/17/2020   S/P cervical spinal fusion    Leukocytosis    Essential hypertension    Anemia of chronic disease    Postoperative pain    Neuropathic pain    Cervical myelopathy (HCC) 08/07/2020   Preop cardiovascular exam 07/17/2020   SOB (shortness of breath) on exertion 07/17/2020   Leg weakness, bilateral 07/13/2020   Aortic atherosclerosis (HCC) 07/09/2020   Body mass index (BMI) 34.0-34.9, adult 12/25/2019   Myalgia 10/30/2019   Lumbar post-laminectomy syndrome 10/24/2019   PAD (peripheral artery disease) (HCC) 06/06/2019   CKD (chronic kidney disease) stage 3, GFR 30-59 ml/min (HCC) 04/29/2019   B12 deficiency 01/23/2019   Left arm numbness 02/28/2018   Neck pain 02/28/2018   Anemia 02/02/2017   DDD (degenerative disc disease), cervical 02/02/2017   Hypothyroid 02/02/2017   MRSA (methicillin resistant staph aureus) culture positive 02/02/2017   Nocturnal hypoxia 02/02/2017   Senile purpura (HCC) 10/03/2016   Essential hypertension, benign 09/16/2016   Bilateral carotid artery disease (HCC) 09/16/2016   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/18/2016   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   History of prostate cancer  12/21/2015   Left carpal tunnel syndrome 11/11/2015   Kidney stone on left side 07/05/2015   Myasthenia gravis (HCC) 05/26/2015   Radiculitis 01/05/2015   Long-term use of high-risk medication 10/15/2014   Persistent cough 09/10/2014   Pure hypercholesterolemia 07/25/2014   Spinal stenosis, lumbar region, with neurogenic claudication 02/24/2014   Lumbosacral stenosis with neurogenic claudication 02/24/2014   COPD (chronic obstructive pulmonary disease) (HCC) 01/22/2014    REFERRING DIAG: balance disorder   THERAPY DIAG:  Difficulty in walking, not elsewhere classified  Muscle weakness (generalized)  Unsteadiness on feet  Other abnormalities of gait and mobility  Chronic bilateral low back pain, unspecified whether sciatica present  Other lack of coordination  Cervical myelopathy (HCC)  Abnormality of gait and mobility  Chronic left-sided low back pain with left-sided sciatica  Rationale for Evaluation and Treatment Rehabilitation  PERTINENT HISTORY: Jemiah Bassin is a 77 year old male referred to OPPT neuro for difficulty with balance. He was also just recently dx in May 2023 with Impingement syndrome of left shoulder (M75.42) Impingement  syndrome of left shoulder (primary encounter diagnosis) Left shoulder pain, unspecified chronicity Nontraumatic rupture of left proximal biceps tendon Past medical hx includes Lumbar laminectomy 04/2021. C3-6 decompression, fusion on 11/26, myasthenia gravis, 2012 ACDF 5/6, 6/7; OSA, hypothyroidism, HTN, COPD, multiple back surgeries, 2021 carpal tunnel release.  PRECAUTIONS: Fall  SUBJECTIVE: Patient reports feeling some better this week but still weak overall.   PAIN:  Are you having pain? R arm pain and R back/leg pain   TODAY'S TREATMENT:    06/20/2023  THEREX:   Nustep L1 BLE Only x 5 min   Standing calf raises- 2 sets of 10 reps from incline at // bars  High knee march walk in // bars - down and back x 5.   Retro steps  in // bars - down and back - x 5   Side stepping in // bars- down and back x 5   Ambulation without UE support- down and back x 2, then rest x 1 min, then down and back  x2.       Pt educated throughout session about proper posture and technique with exercises. Improved exercise technique, movement at target joints, use of target muscles after min to mod verbal, visual, tactile cues.   PATIENT EDUCATION: Education details: Goals, plan, exercise technique, body mechanics, energy conservation techniques  Person educated: Patient Education method: Explanation, Demonstration, Tactile cues, Verbal cues, and Handouts Education comprehension: verbalized understanding, returned demonstration, verbal cues required, and tactile cues required   HOME EXERCISE PROGRAM:  No Updates today  Access Code: [email or web address] URL: [email or web address] Date: 03/01/2022 Prepared by: Precious Bard  Exercises - Seated March  - 1 x daily - 7 x weekly - 2 sets - 10 reps - 5 hold - Seated Long Arc Quad  - 1 x daily - 7 x weekly - 2 sets - 10 reps - 5 hold - Seated Hip Abduction  - 1 x daily - 7 x weekly - 2 sets - 10 reps - 5 hold - Seated Hip Adduction Isometrics with Ball  - 1 x daily - 7 x weekly - 2 sets - 10 reps - 5 hold - Seated Gluteal Sets  - 1 x daily - 7 x weekly - 2 sets - 10 reps - 5 hold - Seated Heel Toe Raises  - 1 x daily - 7 x weekly - 2 sets - 10 reps - 5 hold LE strength and balance      PT Short Term Goals       PT SHORT TERM GOAL #1   Title Pt will be independent with initial HEP in order to improve strength and balance in order to decrease fall risk and improve function at home and work.    Baseline 02/23/2022- Patient reports not doing much as far as exercise in the home currently. 04/20/2022- Patient reports compliant with HEP and no questions at this time.; 9/7 pt indep   Time 6    Period Weeks    Status Goal met   Target Date 04/06/22              PT Long Term  Goals       PT LONG TERM GOAL #1   Title Pt will be independent with final HEP in order to improve strength and balance in order to decrease fall risk and improve function at home and work. 04/20/2022- Patient reports compliant with HEP and no questions at this time.    Baseline 02/23/2022= No formal HEP in  place. 9/7: pt performing at least once a day, feels comfortable   Time 12    Period Weeks    Status GOAL MET   Target Date 08/11/2022      PT LONG TERM GOAL #2   Title Pt will decrease 5TSTS by at least 8 seconds in order to demonstrate clinically significant improvement in LE strength.    Baseline 02/23/2022= 31.52 sec with B hands on knees; 04/20/2022= 16.08 sec with B hands on knees   Time 12    Period Days    Status Goal Met   Target Date 05/18/22      PT LONG TERM GOAL #3   Title Pt will increase by at least 0.23 m/s in order to demonstrate clinically significant improvement in community ambulation.    Baseline 02/23/2022= 0.39 m/s using RW; 0.45 m/s; 9/7: 0.37 m/s with QC, 0.74 m/s with RW   Time 12    Period Weeks    Status GOAL MET   Target Date 05/18/22      PT LONG TERM GOAL #4   Title Pt will improve FOTO to target score of 50 to display perceived improvements in ability to complete ADL's.    Baseline 02/23/2022= 47; 9/7: 49; 11/30=54%   Time 12    Period Weeks    Status GOAL MET   Target Date 08/11/2022      PT LONG TERM GOAL #5   Title Pt will decrease TUG to below 20 seconds/decrease in order to demonstrate decreased fall risk.    Baseline 02/23/2022=28.27 sec with RW; 04/20/2022= 20.22 with SBQC; 9/7: 14.4 sec with RW, 18.5 sec with SBQC   Time 12    Period Weeks    Status MET   Target Date 05/18/22    PT LONG TERM GOAL #6  Title Pt will decrease TUG to 14 sec or below with SBQC in order to demonstrate decreased fall risk.   Baseline  9/7: 14.4 sec with RW, 18.5 sec with SBQC; 11/30=16.85 sec without an AD. 09/15/2022= 22.14 sec with SPC; 11/08/2022= 25.17 sec  without and AD and 21.22 sec with SPC. 01/31/2023= 22.64 sec without a AD and 24.64 sec with SBQC;  03/09/2023= 22.04 sec with SBQC and 14.88 sec with 4WW.  05/02/2023= 20.46 sec with SBQC and 14.22 sec  Time 12   Period Weeks   Status ONGOING  Target Date 07/25/2023   PT LONG TERM GOAL #7  Title Patient will increase six minute walk test distance to >700 ft for improved gait ability and increased ease with community participation.  Baseline 9/7: completes 4 minutes and 444 ft with RW. Test discontinued at 4 minutes due to fatgiue. 08/11/2022= patient ambulated 200 feet yet required 1 seated rest break- due to BLE fatigue- using SBQC: 09/15/2022= Patient ambulated 300 feet in 4 min 20 sec with 1 seated rest break using SBQC. 11/08/2022 = 175 feet- stopped due to exhausted today- 2 min 10 sec; 01/17/2023= 200 feet in 2 min 38 sec; 02/02/2023= 160 feet with use of SBQC in 3 min 45 sec prior to stopping. 6/27 430 feet in 40 sec; 04/25/2023= 6 min complete - 500 feet using 4WW  Time 12   Period Weeks   Status ONGOING  Target Date 07/25/2023   PT LONG TERM GOAL #8  Title Pt will decrease 5TSTS by at least 2 seconds (without UE support)  in order to demonstrate clinically significant improvement in LE strength.   Baseline 08/11/2022= 16.33 sec  with B hands on knees; 09/15/2022= 22 sec with B hands on knees (patient reports increased weakness since around Christmas and just has not bounced back); 11/08/2022= 18.85 without UE Support; 01/17/2023=30.0 sec without UE support- patient reported not having a good day but tried his best. 02/02/2023- 17.74 sec with UE Support; 03/09/2023 =15.82 sec without UE support; 04/25/2023= 15.77 sec  Time 12   Period Weeks  Status ONGOING  Target Date 07/25/2023       PT LONG TERM GOAL #9  Title Pt will improve BERG by at least 3 points in order to demonstrate clinically significant improvement in balance.   Baseline 11/08/2022= 33/56; 01/17/2023= 45/56; 02/02/2023=45/56;  05/02/2023=45/56  Time 12   Period Weeks  Status ONGOING  Target Date 07/25/2023       Plan -     Clinical Impression Statement Patient presents with good motivation today and responded better to therex today- able to stand better and longer today to perform some activities targeting LE strength and endurance. He continues to present with some increased left LE muscle weakness and overall fatigue yet improved since last week.  Patient will continue to benefit from skilled PT services to address deficits and impairment identified in evaluation in order to maximize independence and safety in basic mobility required for performance of ADL, IADL, and leisure.    Personal Factors and Comorbidities Age;Past/Current Experience;Time since onset of injury/illness/exacerbation;Comorbidity 3+    Comorbidities COPD, History of Cancer, myasthenia gravis, chronic lumbar surgical history    Examination-Activity Limitations Lift;Squat;Bend;Stairs;Stand;Transfers;Probation officer;Bathing;Hygiene/Grooming;Dressing;Toileting;Bed Mobility;Caring for Others;Continence    Examination-Participation Restrictions Yard Work;Church;Cleaning;Driving;Community Activity    Stability/Clinical Decision Making Evolving/Moderate complexity    Rehab Potential Good    Clinical Impairments Affecting Rehab Potential multipe comorbidities    PT Frequency 1-2x / week    PT Duration 12 weeks    PT Treatment/Interventions ADLs/Self Care Home Management;Electrical Stimulation;Moist Heat;Traction;Ultrasound;Gait training;Stair training;Functional mobility training;Therapeutic activities;Therapeutic exercise;Balance training;Neuromuscular re-education;Patient/family education;Manual techniques;Passive range of motion;Dry needling;Joint Manipulations;Cryotherapy;Vestibular;Canalith Repostioning    PT Next Visit Plan Progress LE strengthening, balance training,  and functional endurance.   PT Home Exercise Plan standing  strengthening and balance, seated strengthening and coordination.    Consulted and Agree with Plan of Care Patient            2:08 PM, 06/21/23    Lenda Kelp PT Physical Therapist - [facility]  Outpatient Physical Therapy- Main Campus [phone]     06/21/23, 2:08 PM

## 2023-06-22 ENCOUNTER — Ambulatory Visit: Payer: Medicare Other

## 2023-06-22 DIAGNOSIS — R2689 Other abnormalities of gait and mobility: Secondary | ICD-10-CM

## 2023-06-22 DIAGNOSIS — R269 Unspecified abnormalities of gait and mobility: Secondary | ICD-10-CM

## 2023-06-22 DIAGNOSIS — G8929 Other chronic pain: Secondary | ICD-10-CM

## 2023-06-22 DIAGNOSIS — R262 Difficulty in walking, not elsewhere classified: Secondary | ICD-10-CM | POA: Diagnosis not present

## 2023-06-22 DIAGNOSIS — M6281 Muscle weakness (generalized): Secondary | ICD-10-CM

## 2023-06-22 DIAGNOSIS — R2681 Unsteadiness on feet: Secondary | ICD-10-CM

## 2023-06-22 DIAGNOSIS — G959 Disease of spinal cord, unspecified: Secondary | ICD-10-CM

## 2023-06-22 DIAGNOSIS — R278 Other lack of coordination: Secondary | ICD-10-CM

## 2023-06-22 NOTE — Therapy (Signed)
OUTPATIENT PHYSICAL THERAPY TREATMENT/Physical Therapy Progress Note   Dates of reporting period  03/09/2023   to   06/22/2023   Patient Name: Francisco [name] MRN: [id number] DOB:[birth year], 77 y.o., male Today's Date: 06/23/2023  PCP: Lynnea Ferrier, MD REFERRING PROVIDER: Venetia Night MD   PT End of Session - 06/22/23 1329     Visit Number 80    Number of Visits 100    Date for PT Re-Evaluation 07/25/23    Authorization Type [employer] Medicare    Progress Note Due on Visit 90    PT Start Time 1323    PT Stop Time 1359    PT Time Calculation (min) 36 min    Equipment Utilized During Treatment Gait belt    Activity Tolerance Patient tolerated treatment well;Patient limited by fatigue;No increased pain    Behavior During Therapy WFL for tasks assessed/performed                       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   Carotid stenosis    CHF (congestive heart failure) (HCC)    CKD (chronic kidney disease)    CKD (chronic kidney disease) stage 3, GFR 30-59 ml/min (HCC)    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   DDD (degenerative disc disease), cervical    Dysrhythmia    post carotid stent bradycardia; PAF 09/2020   GERD (gastroesophageal reflux disease)    Hypercholesterolemia    Hypertension    Hypothyroidism    pt takes Levothyroxine daily   Lumbosacral spinal stenosis    Myasthenia gravis, adult form (HCC)    PAD (peripheral artery disease) (HCC)    Parkinson disease (HCC)    Shortness of breath    Lung MD- Dr Joetta Manners   Sleep apnea    do not use CPAP every night   Stroke (HCC) 05/31/2023   Past Surgical History:  Procedure Laterality  Date   ANTERIOR CERVICAL DECOMP/DISCECTOMY FUSION  07/18/2011   Procedure: ANTERIOR CERVICAL DECOMPRESSION/DISCECTOMY FUSION 2 LEVELS;  Surgeon: Kathaleen Maser Pool;  Location: [facility];  Service: Neurosurgery;  Laterality: N/A;  cervical five-six, cervical six-seven anterior cervical discectomy and fusion   ATRIAL FIBRILLATION ABLATION     BACK SURGERY     in 1985 [facility]   BILATERAL CARPAL TUNNEL RELEASE     01/2020 Right, 04/2020 Left   CARDIAC CATHETERIZATION     2005 at [facility], no stents   CAROTID PTA/STENT INTERVENTION N/A 09/17/2020   Procedure: CAROTID PTA/STENT INTERVENTION;  Surgeon: Annice Needy, MD;  Location: [facility];  Service: Cardiovascular;  Laterality: N/A;   CATARACT EXTRACTION W/PHACO Left 01/06/2020   Procedure: CATARACT EXTRACTION PHACO AND INTRAOCULAR LENS PLACEMENT (IOC) ISTENT INJ LEFT 3.81  00:33.3;  Surgeon: Nevada Crane, MD;  Location: [facility];  Service: Ophthalmology;  Laterality: Left;   CATARACT EXTRACTION W/PHACO Right 02/03/2020   Procedure: CATARACT EXTRACTION PHACO AND INTRAOCULAR LENS PLACEMENT (IOC) RIGHT ISTENT INJ;  Surgeon: Nevada Crane, MD;  Location: [facility];  Service: Ophthalmology;  Laterality: Right;  4.29 0:35.6   COLONOSCOPY     HERNIA REPAIR Left    inguinal hernia repair in 1985   LUMBAR LAMINECTOMY/DECOMPRESSION MICRODISCECTOMY Left 02/24/2014   Procedure: LUMBAR LAMINECTOMY/DECOMPRESSION MICRODISCECTOMY LUMBAR THREE-FOUR, FOUR-FIVE, LEFT FIVE-SACRAL ONE ;  Surgeon: Temple Pacini, MD;  Location: [facility];  Service: Neurosurgery;  Laterality: Left;  LUMBAR LAMINECTOMY/DECOMPRESSION MICRODISCECTOMY LUMBAR THREE-FOUR, FOUR-FIVE, LEFT FIVE-SACRAL ONE    LUMBAR LAMINECTOMY/DECOMPRESSION MICRODISCECTOMY N/A 05/03/2021   Procedure: Laminectomy and Foraminotomy - L2-L3;  Surgeon: Julio Sicks, MD;  Location: [facility];  Service: Neurosurgery;  Laterality: N/A;  3C   POSTERIOR CERVICAL FUSION/FORAMINOTOMY N/A  08/07/2020   Procedure: C3-6 POSTERIOR FUSION WITH DECOMPRESSION;  Surgeon: Venetia Night, MD;  Location: [facility];  Service: Neurosurgery;  Laterality: N/A;   PROSTATECTOMY  04/2013   [facility] Dr Joycelyn Das    Patient Active Problem List   Diagnosis Date Noted   Stroke [facility]) 05/31/2023   Obesity (BMI 30-39.9) 05/31/2023   HLD (hyperlipidemia) 05/31/2023   Chronic diastolic CHF (congestive heart failure) (HCC) 05/31/2023   Hyperkalemia 05/31/2023   SVT (supraventricular tachycardia) (HCC) 12/30/2021   Hypothyroidism 12/30/2021   Parkinson's disease (HCC) 12/30/2021   Elevated troponin 12/30/2021   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   Acquired thrombophilia (HCC) 01/13/2021   History of decompression of median nerve 01/01/2021   Paroxysmal atrial fibrillation (HCC) 10/06/2020   Carotid stenosis, right 09/17/2020   S/P cervical spinal fusion    Leukocytosis    Essential hypertension    Anemia of chronic disease    Postoperative pain    Neuropathic pain    Cervical myelopathy (HCC) 08/07/2020   Preop cardiovascular exam 07/17/2020   SOB (shortness of breath) on exertion 07/17/2020   Leg weakness, bilateral 07/13/2020   Aortic atherosclerosis (HCC) 07/09/2020   Body mass index (BMI) 34.0-34.9, adult 12/25/2019   Myalgia 10/30/2019   Lumbar post-laminectomy syndrome 10/24/2019   PAD (peripheral artery disease) (HCC) 06/06/2019   CKD (chronic kidney disease) stage 3, GFR 30-59 ml/min (HCC) 04/29/2019   B12 deficiency 01/23/2019   Left arm numbness 02/28/2018   Neck pain 02/28/2018   Anemia 02/02/2017   DDD (degenerative disc disease), cervical 02/02/2017   Hypothyroid 02/02/2017   MRSA (methicillin resistant staph aureus) culture positive 02/02/2017   Nocturnal hypoxia 02/02/2017   Senile purpura (HCC) 10/03/2016   Essential hypertension, benign 09/16/2016   Bilateral carotid artery disease (HCC) 09/16/2016   Facet arthritis of lumbar region  03/18/2016   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   History of prostate cancer 12/21/2015   Left carpal tunnel syndrome 11/11/2015   Kidney stone on left side 07/05/2015   Myasthenia gravis (HCC) 05/26/2015   Radiculitis 01/05/2015   Long-term use of high-risk medication 10/15/2014   Persistent cough 09/10/2014   Pure hypercholesterolemia 07/25/2014   Spinal stenosis, lumbar region, with neurogenic claudication 02/24/2014   Lumbosacral stenosis with neurogenic claudication 02/24/2014   COPD (chronic obstructive pulmonary disease) (HCC) 01/22/2014    REFERRING DIAG: balance disorder   THERAPY DIAG:  Difficulty in walking, not elsewhere classified  Muscle weakness (generalized)  Unsteadiness on feet  Other abnormalities of gait and mobility  Chronic bilateral low back pain, unspecified whether sciatica present  Other lack of coordination  Cervical myelopathy (HCC)  Abnormality of gait and mobility  Rationale for Evaluation and Treatment Rehabilitation  PERTINENT HISTORY: Xayne Berardino is a 77 year old male referred to [facility] neuro for difficulty with balance. He was also just recently dx in May  2023 with Impingement syndrome of left shoulder (M75.42) Impingement syndrome of left shoulder (primary encounter diagnosis) Left shoulder pain, unspecified chronicity Nontraumatic rupture of left proximal biceps tendon Past medical hx includes Lumbar laminectomy 04/2021. C3-6 decompression, fusion on 11/26, myasthenia gravis, 2012 ACDF 5/6, 6/7; OSA, hypothyroidism, HTN, COPD, multiple back surgeries, 2021 carpal tunnel release.  PRECAUTIONS: Fall  SUBJECTIVE: Patient reports continuing to feel some better this week - no new issues.   PAIN:  Are you having pain? R arm pain and R back/leg pain   TODAY'S TREATMENT:    06/22/2023  THEREX:   Nustep L1 BLE Only x 5 min   Physical therapy treatment session today consisted of completing assessment of goals and administration of  testing as demonstrated and documented in flow sheet, treatment, and goals section of this note. Addition treatments may be found below.    Pt educated throughout session about proper posture and technique with exercises. Improved exercise technique, movement at target joints, use of target muscles after min to mod verbal, visual, tactile cues.   PATIENT EDUCATION: Education details: Goals, plan, exercise technique, body mechanics, energy conservation techniques  Person educated: Patient Education method: Explanation, Demonstration, Tactile cues, Verbal cues, and Handouts Education comprehension: verbalized understanding, returned demonstration, verbal cues required, and tactile cues required   HOME EXERCISE PROGRAM:  No Updates today  Access Code: [email or web address] URL: [email or web address] Date: 03/01/2022 Prepared by: Precious Bard  Exercises - Seated March  - 1 x daily - 7 x weekly - 2 sets - 10 reps - 5 hold - Seated Long Arc Quad  - 1 x daily - 7 x weekly - 2 sets - 10 reps - 5 hold - Seated Hip Abduction  - 1 x daily - 7 x weekly - 2 sets - 10 reps - 5 hold - Seated Hip Adduction Isometrics with Ball  - 1 x daily - 7 x weekly - 2 sets - 10 reps - 5 hold - Seated Gluteal Sets  - 1 x daily - 7 x weekly - 2 sets - 10 reps - 5 hold - Seated Heel Toe Raises  - 1 x daily - 7 x weekly - 2 sets - 10 reps - 5 hold LE strength and balance      PT Short Term Goals       PT SHORT TERM GOAL #1   Title Pt will be independent with initial HEP in order to improve strength and balance in order to decrease fall risk and improve function at home and work.    Baseline 02/23/2022- Patient reports not doing much as far as exercise in the home currently. 04/20/2022- Patient reports compliant with HEP and no questions at this time.; 9/7 pt indep   Time 6    Period Weeks    Status Goal met   Target Date 04/06/22              PT Long Term Goals       PT LONG TERM GOAL #1   Title Pt  will be independent with final HEP in order to improve strength and balance in order to decrease fall risk and improve function at home and work. 04/20/2022- Patient reports compliant with HEP and no questions at this time.    Baseline 02/23/2022= No formal HEP in place. 9/7: pt performing at least once a day, feels comfortable   Time 12    Period Weeks    Status GOAL MET   Target Date 08/11/2022  PT LONG TERM GOAL #2   Title Pt will decrease 5TSTS by at least 8 seconds in order to demonstrate clinically significant improvement in LE strength.    Baseline 02/23/2022= 31.52 sec with B hands on knees; 04/20/2022= 16.08 sec with B hands on knees   Time 12    Period Days    Status Goal Met   Target Date 05/18/22      PT LONG TERM GOAL #3   Title Pt will increase by at least 0.23 m/s in order to demonstrate clinically significant improvement in community ambulation.    Baseline 02/23/2022= 0.39 m/s using RW; 0.45 m/s; 9/7: 0.37 m/s with QC, 0.74 m/s with RW   Time 12    Period Weeks    Status GOAL MET   Target Date 05/18/22      PT LONG TERM GOAL #4   Title Pt will improve FOTO to target score of 50 to display perceived improvements in ability to complete ADL's.    Baseline 02/23/2022= 47; 9/7: 49; 11/30=54%   Time 12    Period Weeks    Status GOAL MET   Target Date 08/11/2022      PT LONG TERM GOAL #5   Title Pt will decrease TUG to below 20 seconds/decrease in order to demonstrate decreased fall risk.    Baseline 02/23/2022=28.27 sec with RW; 04/20/2022= 20.22 with SBQC; 9/7: 14.4 sec with RW, 18.5 sec with SBQC   Time 12    Period Weeks    Status MET   Target Date 05/18/22    PT LONG TERM GOAL #6  Title Pt will decrease TUG to 14 sec or below with SBQC in order to demonstrate decreased fall risk.   Baseline  9/7: 14.4 sec with RW, 18.5 sec with SBQC; 11/30=16.85 sec without an AD. 09/15/2022= 22.14 sec with SPC; 11/08/2022= 25.17 sec without and AD and 21.22 sec with SPC.  01/31/2023= 22.64 sec without a AD and 24.64 sec with SBQC;  03/09/2023= 22.04 sec with SBQC and 14.88 sec with 4WW.  05/02/2023= 20.46 sec with SBQC and 14.22 sec with 4WW; 06/21/2023= 16.47 sec with SBQC.   Time 12   Period Weeks   Status ONGOING  Target Date 07/25/2023   PT LONG TERM GOAL #7  Title Patient will increase six minute walk test distance to >700 ft for improved gait ability and increased ease with community participation.  Baseline 9/7: completes 4 minutes and 444 ft with RW. Test discontinued at 4 minutes due to fatgiue. 08/11/2022= patient ambulated 200 feet yet required 1 seated rest break- due to BLE fatigue- using SBQC: 09/15/2022= Patient ambulated 300 feet in 4 min 20 sec with 1 seated rest break using SBQC. 11/08/2022 = 175 feet- stopped due to exhausted today- 2 min 10 sec; 01/17/2023= 200 feet in 2 min 38 sec; 02/02/2023= 160 feet with use of SBQC in 3 min 45 sec prior to stopping. 6/27 430 feet in 40 sec; 04/25/2023= 6 min complete - 500 feet using 4WW; 06/22/2023= 630 feet with 4WW  Time 12   Period Weeks   Status ONGOING  Target Date 07/25/2023   PT LONG TERM GOAL #8  Title Pt will decrease 5TSTS by at least 2 seconds (without UE support)  in order to demonstrate clinically significant improvement in LE strength.   Baseline 08/11/2022= 16.33 sec with B hands on knees; 09/15/2022= 22 sec with B hands on knees (patient reports increased weakness since around Christmas and just has  not bounced back); 11/08/2022= 18.85 without UE Support; 01/17/2023=30.0 sec without UE support- patient reported not having a good day but tried his best. 02/02/2023- 17.74 sec with UE Support; 03/09/2023 =15.82 sec without UE support; 04/25/2023= 15.77 sec; 06/21/2023= 17.09 sec without UE support.   Time 12   Period Weeks  Status ONGOING  Target Date 07/25/2023       PT LONG TERM GOAL #9  Title Pt will improve BERG by at least 3 points in order to demonstrate clinically significant improvement in  balance.   Baseline 11/08/2022= 33/56; 01/17/2023= 45/56; 02/02/2023=45/56; 05/02/2023=45/56  Time 12   Period Weeks  Status ONGOING  Target Date 07/25/2023       Plan -     Clinical Impression Statement Patient presents with good motivation to participate in progress visit. He was retested with multiple outcome testing and overall- demo good/steady progression despite most recently going to ED multiple times over past 2 months. He demo some progress with his functional endurance and decreased timed up and go using cane today. Patient's condition has the potential to improve in response to therapy. Maximum improvement is yet to be obtained. The anticipated improvement is attainable and reasonable in a generally predictable time.  Patient will continue to benefit from skilled PT services to address deficits and impairment identified in evaluation in order to maximize independence and safety in basic mobility required for performance of ADL, IADL, and leisure.    Personal Factors and Comorbidities Age;Past/Current Experience;Time since onset of injury/illness/exacerbation;Comorbidity 3+    Comorbidities COPD, History of Cancer, myasthenia gravis, chronic lumbar surgical history    Examination-Activity Limitations Lift;Squat;Bend;Stairs;Stand;Transfers;Probation officer;Bathing;Hygiene/Grooming;Dressing;Toileting;Bed Mobility;Caring for Others;Continence    Examination-Participation Restrictions Yard Work;Church;Cleaning;Driving;Community Activity    Stability/Clinical Decision Making Evolving/Moderate complexity    Rehab Potential Good    Clinical Impairments Affecting Rehab Potential multipe comorbidities    PT Frequency 1-2x / week    PT Duration 12 weeks    PT Treatment/Interventions ADLs/Self Care Home Management;Electrical Stimulation;Moist Heat;Traction;Ultrasound;Gait training;Stair training;Functional mobility training;Therapeutic activities;Therapeutic exercise;Balance  training;Neuromuscular re-education;Patient/family education;Manual techniques;Passive range of motion;Dry needling;Joint Manipulations;Cryotherapy;Vestibular;Canalith Repostioning    PT Next Visit Plan Progress LE strengthening, balance training,  and functional endurance.   PT Home Exercise Plan standing strengthening and balance, seated strengthening and coordination.    Consulted and Agree with Plan of Care Patient            9:55 AM, 06/23/23    Lenda Kelp PT Physical Therapist - [facility]  Outpatient Physical Therapy- [facility] [phone]     06/23/23, 9:55 AM

## 2023-06-23 ENCOUNTER — Ambulatory Visit (INDEPENDENT_AMBULATORY_CARE_PROVIDER_SITE_OTHER): Payer: Medicare Other

## 2023-06-23 ENCOUNTER — Ambulatory Visit (INDEPENDENT_AMBULATORY_CARE_PROVIDER_SITE_OTHER): Payer: Medicare Other | Admitting: Vascular Surgery

## 2023-06-23 DIAGNOSIS — I6523 Occlusion and stenosis of bilateral carotid arteries: Secondary | ICD-10-CM

## 2023-06-26 ENCOUNTER — Other Ambulatory Visit (INDEPENDENT_AMBULATORY_CARE_PROVIDER_SITE_OTHER): Payer: Self-pay | Admitting: Nurse Practitioner

## 2023-06-27 ENCOUNTER — Ambulatory Visit: Payer: Medicare Other

## 2023-06-29 ENCOUNTER — Ambulatory Visit: Payer: Medicare Other

## 2023-06-29 DIAGNOSIS — G8929 Other chronic pain: Secondary | ICD-10-CM

## 2023-06-29 DIAGNOSIS — R262 Difficulty in walking, not elsewhere classified: Secondary | ICD-10-CM

## 2023-06-29 DIAGNOSIS — R269 Unspecified abnormalities of gait and mobility: Secondary | ICD-10-CM

## 2023-06-29 DIAGNOSIS — R278 Other lack of coordination: Secondary | ICD-10-CM

## 2023-06-29 DIAGNOSIS — G959 Disease of spinal cord, unspecified: Secondary | ICD-10-CM

## 2023-06-29 DIAGNOSIS — R2681 Unsteadiness on feet: Secondary | ICD-10-CM

## 2023-06-29 DIAGNOSIS — M545 Low back pain, unspecified: Secondary | ICD-10-CM

## 2023-06-29 DIAGNOSIS — M6281 Muscle weakness (generalized): Secondary | ICD-10-CM

## 2023-06-29 DIAGNOSIS — R2689 Other abnormalities of gait and mobility: Secondary | ICD-10-CM

## 2023-06-29 NOTE — Therapy (Signed)
OUTPATIENT PHYSICAL THERAPY TREATMENT  Patient Name: Francisco [name] MRN: [id number] DOB:Oct 25, 1945, 77 y.o., male Today's Date: 06/29/2023  PCP: Lynnea Ferrier, MD REFERRING PROVIDER: Venetia Night MD   PT End of Session - 06/29/23 1449     Visit Number 81    Number of Visits 100    Date for PT Re-Evaluation 07/25/23    Authorization Type [employer] Medicare    Progress Note Due on Visit 90    PT Start Time 1446    PT Stop Time 1527    PT Time Calculation (min) 41 min    Equipment Utilized During Treatment Gait belt    Activity Tolerance Patient tolerated treatment well;Patient limited by fatigue;No increased pain    Behavior During Therapy WFL for tasks assessed/performed                       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   Carotid stenosis    CHF (congestive heart failure) (HCC)    CKD (chronic kidney disease)    CKD (chronic kidney disease) stage 3, GFR 30-59 ml/min (HCC)    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   DDD (degenerative disc disease), cervical    Dysrhythmia    post carotid stent bradycardia; PAF 09/2020   GERD (gastroesophageal reflux disease)    Hypercholesterolemia    Hypertension    Hypothyroidism    pt takes Levothyroxine daily   Lumbosacral spinal stenosis    Myasthenia gravis, adult form (HCC)    PAD (peripheral artery disease) (HCC)    Parkinson disease (HCC)    Shortness of breath    Lung MD- Dr Joetta Manners   Sleep apnea    do not use CPAP every night   Stroke (HCC) 05/31/2023   Past Surgical History:  Procedure Laterality Date   ANTERIOR CERVICAL DECOMP/DISCECTOMY FUSION  07/18/2011   Procedure: ANTERIOR  CERVICAL DECOMPRESSION/DISCECTOMY FUSION 2 LEVELS;  Surgeon: Kathaleen Maser Pool;  Location: [facility];  Service: Neurosurgery;  Laterality: N/A;  cervical five-six, cervical six-seven anterior cervical discectomy and fusion   ATRIAL FIBRILLATION ABLATION     BACK SURGERY     in 1985 [facility]   BILATERAL CARPAL TUNNEL RELEASE     01/2020 Right, 04/2020 Left   CARDIAC CATHETERIZATION     2005 at [facility], no stents   CAROTID PTA/STENT INTERVENTION N/A 09/17/2020   Procedure: CAROTID PTA/STENT INTERVENTION;  Surgeon: Annice Needy, MD;  Location: [facility];  Service: Cardiovascular;  Laterality: N/A;   CATARACT EXTRACTION W/PHACO Left 01/06/2020   Procedure: CATARACT EXTRACTION PHACO AND INTRAOCULAR LENS PLACEMENT (IOC) ISTENT INJ LEFT 3.81  00:33.3;  Surgeon: Nevada Crane, MD;  Location: [facility];  Service: Ophthalmology;  Laterality: Left;   CATARACT EXTRACTION W/PHACO Right 02/03/2020   Procedure: CATARACT EXTRACTION PHACO AND INTRAOCULAR LENS PLACEMENT (IOC) RIGHT ISTENT INJ;  Surgeon: Nevada Crane, MD;  Location: [facility];  Service: Ophthalmology;  Laterality: Right;  4.29 0:35.6   COLONOSCOPY     HERNIA REPAIR Left    inguinal hernia repair  in 1985   LUMBAR LAMINECTOMY/DECOMPRESSION MICRODISCECTOMY Left 02/24/2014   Procedure: LUMBAR LAMINECTOMY/DECOMPRESSION MICRODISCECTOMY LUMBAR THREE-FOUR, FOUR-FIVE, LEFT FIVE-SACRAL ONE ;  Surgeon: Temple Pacini, MD;  Location: [facility];  Service: Neurosurgery;  Laterality: Left;  LUMBAR LAMINECTOMY/DECOMPRESSION MICRODISCECTOMY LUMBAR THREE-FOUR, FOUR-FIVE, LEFT FIVE-SACRAL ONE    LUMBAR LAMINECTOMY/DECOMPRESSION MICRODISCECTOMY N/A 05/03/2021   Procedure: Laminectomy and Foraminotomy - L2-L3;  Surgeon: Julio Sicks, MD;  Location: [facility];  Service: Neurosurgery;  Laterality: N/A;  3C   POSTERIOR CERVICAL FUSION/FORAMINOTOMY N/A 08/07/2020   Procedure: C3-6 POSTERIOR FUSION WITH DECOMPRESSION;  Surgeon:  Venetia Night, MD;  Location: [facility];  Service: Neurosurgery;  Laterality: N/A;   PROSTATECTOMY  04/2013   [facility] Dr Joycelyn Das    Patient Active Problem List   Diagnosis Date Noted   Stroke [facility]) 05/31/2023   Obesity (BMI 30-39.9) 05/31/2023   HLD (hyperlipidemia) 05/31/2023   Chronic diastolic CHF (congestive heart failure) (HCC) 05/31/2023   Hyperkalemia 05/31/2023   SVT (supraventricular tachycardia) (HCC) 12/30/2021   Hypothyroidism 12/30/2021   Parkinson's disease (HCC) 12/30/2021   Elevated troponin 12/30/2021   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   Acquired thrombophilia (HCC) 01/13/2021   History of decompression of median nerve 01/01/2021   Paroxysmal atrial fibrillation (HCC) 10/06/2020   Carotid stenosis, right 09/17/2020   S/P cervical spinal fusion    Leukocytosis    Essential hypertension    Anemia of chronic disease    Postoperative pain    Neuropathic pain    Cervical myelopathy (HCC) 08/07/2020   Preop cardiovascular exam 07/17/2020   SOB (shortness of breath) on exertion 07/17/2020   Leg weakness, bilateral 07/13/2020   Aortic atherosclerosis (HCC) 07/09/2020   Body mass index (BMI) 34.0-34.9, adult 12/25/2019   Myalgia 10/30/2019   Lumbar post-laminectomy syndrome 10/24/2019   PAD (peripheral artery disease) (HCC) 06/06/2019   CKD (chronic kidney disease) stage 3, GFR 30-59 ml/min (HCC) 04/29/2019   B12 deficiency 01/23/2019   Left arm numbness 02/28/2018   Neck pain 02/28/2018   Anemia 02/02/2017   DDD (degenerative disc disease), cervical 02/02/2017   Hypothyroid 02/02/2017   MRSA (methicillin resistant staph aureus) culture positive 02/02/2017   Nocturnal hypoxia 02/02/2017   Senile purpura (HCC) 10/03/2016   Essential hypertension, benign 09/16/2016   Bilateral carotid artery disease (HCC) 09/16/2016   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/18/2016   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   History of prostate cancer  12/21/2015   Left carpal tunnel syndrome 11/11/2015   Kidney stone on left side 07/05/2015   Myasthenia gravis (HCC) 05/26/2015   Radiculitis 01/05/2015   Long-term use of high-risk medication 10/15/2014   Persistent cough 09/10/2014   Pure hypercholesterolemia 07/25/2014   Spinal stenosis, lumbar region, with neurogenic claudication 02/24/2014   Lumbosacral stenosis with neurogenic claudication 02/24/2014   COPD (chronic obstructive pulmonary disease) (HCC) 01/22/2014    REFERRING DIAG: balance disorder   THERAPY DIAG:  Difficulty in walking, not elsewhere classified  Muscle weakness (generalized)  Unsteadiness on feet  Other abnormalities of gait and mobility  Chronic bilateral low back pain, unspecified whether sciatica present  Other lack of coordination  Cervical myelopathy (HCC)  Abnormality of gait and mobility  Chronic left-sided low back pain with left-sided sciatica  Rationale for Evaluation and Treatment Rehabilitation  PERTINENT HISTORY: Luxton Salonen is a 77 year old male referred to OPPT neuro for difficulty with balance. He was also just recently dx in May 2023 with Impingement syndrome of left shoulder (M75.42) Impingement  syndrome of left shoulder (primary encounter diagnosis) Left shoulder pain, unspecified chronicity Nontraumatic rupture of left proximal biceps tendon Past medical hx includes Lumbar laminectomy 04/2021. C3-6 decompression, fusion on 11/26, myasthenia gravis, 2012 ACDF 5/6, 6/7; OSA, hypothyroidism, HTN, COPD, multiple back surgeries, 2021 carpal tunnel release.  PRECAUTIONS: Fall  SUBJECTIVE: Patient reports doing okay- tired today but no other complaints PAIN:  Are you having pain? R arm pain and R back/leg pain   TODAY'S TREATMENT:    06/29/2023  THEREX:   Seated hip march x 12 reps each LE Seated knee ext x 15 reps each LE  Sit to stand from varying surface height at mat table -25 in x5 -23 in x5 -21in x5 -19 in x  5   NMR Retro/forward x 10 feet walk- counting steps- (forward- 12-16 steps/backward- 14-20 reps)  Diagonal step forward and reach with x 10  Side step and overhead reach x 10     Pt educated throughout session about proper posture and technique with exercises. Improved exercise technique, movement at target joints, use of target muscles after min to mod verbal, visual, tactile cues.   PATIENT EDUCATION: Education details: Goals, plan, exercise technique, body mechanics, energy conservation techniques  Person educated: Patient Education method: Explanation, Demonstration, Tactile cues, Verbal cues, and Handouts Education comprehension: verbalized understanding, returned demonstration, verbal cues required, and tactile cues required   HOME EXERCISE PROGRAM:  No Updates today  Access Code: [email or web address] URL: [email or web address] Date: 03/01/2022 Prepared by: Precious Bard  Exercises - Seated March  - 1 x daily - 7 x weekly - 2 sets - 10 reps - 5 hold - Seated Long Arc Quad  - 1 x daily - 7 x weekly - 2 sets - 10 reps - 5 hold - Seated Hip Abduction  - 1 x daily - 7 x weekly - 2 sets - 10 reps - 5 hold - Seated Hip Adduction Isometrics with Ball  - 1 x daily - 7 x weekly - 2 sets - 10 reps - 5 hold - Seated Gluteal Sets  - 1 x daily - 7 x weekly - 2 sets - 10 reps - 5 hold - Seated Heel Toe Raises  - 1 x daily - 7 x weekly - 2 sets - 10 reps - 5 hold LE strength and balance      PT Short Term Goals       PT SHORT TERM GOAL #1   Title Pt will be independent with initial HEP in order to improve strength and balance in order to decrease fall risk and improve function at home and work.    Baseline 02/23/2022- Patient reports not doing much as far as exercise in the home currently. 04/20/2022- Patient reports compliant with HEP and no questions at this time.; 9/7 pt indep   Time 6    Period Weeks    Status Goal met   Target Date 04/06/22              PT Long Term  Goals       PT LONG TERM GOAL #1   Title Pt will be independent with final HEP in order to improve strength and balance in order to decrease fall risk and improve function at home and work. 04/20/2022- Patient reports compliant with HEP and no questions at this time.    Baseline 02/23/2022= No formal HEP in place. 9/7: pt performing at least once a day, feels comfortable   Time 12  Period Weeks    Status GOAL MET   Target Date 08/11/2022      PT LONG TERM GOAL #2   Title Pt will decrease 5TSTS by at least 8 seconds in order to demonstrate clinically significant improvement in LE strength.    Baseline 02/23/2022= 31.52 sec with B hands on knees; 04/20/2022= 16.08 sec with B hands on knees   Time 12    Period Days    Status Goal Met   Target Date 05/18/22      PT LONG TERM GOAL #3   Title Pt will increase by at least 0.23 m/s in order to demonstrate clinically significant improvement in community ambulation.    Baseline 02/23/2022= 0.39 m/s using RW; 0.45 m/s; 9/7: 0.37 m/s with QC, 0.74 m/s with RW   Time 12    Period Weeks    Status GOAL MET   Target Date 05/18/22      PT LONG TERM GOAL #4   Title Pt will improve FOTO to target score of 50 to display perceived improvements in ability to complete ADL's.    Baseline 02/23/2022= 47; 9/7: 49; 11/30=54%   Time 12    Period Weeks    Status GOAL MET   Target Date 08/11/2022      PT LONG TERM GOAL #5   Title Pt will decrease TUG to below 20 seconds/decrease in order to demonstrate decreased fall risk.    Baseline 02/23/2022=28.27 sec with RW; 04/20/2022= 20.22 with SBQC; 9/7: 14.4 sec with RW, 18.5 sec with SBQC   Time 12    Period Weeks    Status MET   Target Date 05/18/22    PT LONG TERM GOAL #6  Title Pt will decrease TUG to 14 sec or below with SBQC in order to demonstrate decreased fall risk.   Baseline  9/7: 14.4 sec with RW, 18.5 sec with SBQC; 11/30=16.85 sec without an AD. 09/15/2022= 22.14 sec with SPC; 11/08/2022= 25.17 sec  without and AD and 21.22 sec with SPC. 01/31/2023= 22.64 sec without a AD and 24.64 sec with SBQC;  03/09/2023= 22.04 sec with SBQC and 14.88 sec with 4WW.  05/02/2023= 20.46 sec with SBQC and 14.22 sec with 4WW; 06/21/2023= 16.47 sec with SBQC.   Time 12   Period Weeks   Status ONGOING  Target Date 07/25/2023   PT LONG TERM GOAL #7  Title Patient will increase six minute walk test distance to >700 ft for improved gait ability and increased ease with community participation.  Baseline 9/7: completes 4 minutes and 444 ft with RW. Test discontinued at 4 minutes due to fatgiue. 08/11/2022= patient ambulated 200 feet yet required 1 seated rest break- due to BLE fatigue- using SBQC: 09/15/2022= Patient ambulated 300 feet in 4 min 20 sec with 1 seated rest break using SBQC. 11/08/2022 = 175 feet- stopped due to exhausted today- 2 min 10 sec; 01/17/2023= 200 feet in 2 min 38 sec; 02/02/2023= 160 feet with use of SBQC in 3 min 45 sec prior to stopping. 6/27 430 feet in 40 sec; 04/25/2023= 6 min complete - 500 feet using 4WW; 06/22/2023= 630 feet with 4WW  Time 12   Period Weeks   Status ONGOING  Target Date 07/25/2023   PT LONG TERM GOAL #8  Title Pt will decrease 5TSTS by at least 2 seconds (without UE support)  in order to demonstrate clinically significant improvement in LE strength.   Baseline 08/11/2022= 16.33 sec with B hands on knees;  09/15/2022= 22 sec with B hands on knees (patient reports increased weakness since around Christmas and just has not bounced back); 11/08/2022= 18.85 without UE Support; 01/17/2023=30.0 sec without UE support- patient reported not having a good day but tried his best. 02/02/2023- 17.74 sec with UE Support; 03/09/2023 =15.82 sec without UE support; 04/25/2023= 15.77 sec; 06/21/2023= 17.09 sec without UE support.   Time 12   Period Weeks  Status ONGOING  Target Date 07/25/2023       PT LONG TERM GOAL #9  Title Pt will improve BERG by at least 3 points in order to demonstrate  clinically significant improvement in balance.   Baseline 11/08/2022= 33/56; 01/17/2023= 45/56; 02/02/2023=45/56; 05/02/2023=45/56  Time 12   Period Weeks  Status ONGOING  Target Date 07/25/2023       Plan -     Clinical Impression Statement Treatment continued per plan of care- focusing on LE strengthening and balance. HE was able to participate in the entire treatment without use of device or UE support performing several standing tasks. He did not report any significant increase in back pain and able to demo in session progress with steps and no LOB today.  Patient will continue to benefit from skilled PT services to address deficits and impairment identified in evaluation in order to maximize independence and safety in basic mobility required for performance of ADL, IADL, and leisure.    Personal Factors and Comorbidities Age;Past/Current Experience;Time since onset of injury/illness/exacerbation;Comorbidity 3+    Comorbidities COPD, History of Cancer, myasthenia gravis, chronic lumbar surgical history    Examination-Activity Limitations Lift;Squat;Bend;Stairs;Stand;Transfers;Probation officer;Bathing;Hygiene/Grooming;Dressing;Toileting;Bed Mobility;Caring for Others;Continence    Examination-Participation Restrictions Yard Work;Church;Cleaning;Driving;Community Activity    Stability/Clinical Decision Making Evolving/Moderate complexity    Rehab Potential Good    Clinical Impairments Affecting Rehab Potential multipe comorbidities    PT Frequency 1-2x / week    PT Duration 12 weeks    PT Treatment/Interventions ADLs/Self Care Home Management;Electrical Stimulation;Moist Heat;Traction;Ultrasound;Gait training;Stair training;Functional mobility training;Therapeutic activities;Therapeutic exercise;Balance training;Neuromuscular re-education;Patient/family education;Manual techniques;Passive range of motion;Dry needling;Joint Manipulations;Cryotherapy;Vestibular;Canalith  Repostioning    PT Next Visit Plan Progress LE strengthening, balance training,  and functional endurance.   PT Home Exercise Plan standing strengthening and balance, seated strengthening and coordination.    Consulted and Agree with Plan of Care Patient            3:29 PM, 06/29/23    Lenda Kelp PT Physical Therapist - [facility]  Outpatient Physical Therapy- [facility] [phone]     06/29/23, 3:29 PM

## 2023-07-04 ENCOUNTER — Ambulatory Visit: Payer: Medicare Other

## 2023-07-04 DIAGNOSIS — G959 Disease of spinal cord, unspecified: Secondary | ICD-10-CM

## 2023-07-04 DIAGNOSIS — M6281 Muscle weakness (generalized): Secondary | ICD-10-CM

## 2023-07-04 DIAGNOSIS — G8929 Other chronic pain: Secondary | ICD-10-CM

## 2023-07-04 DIAGNOSIS — R262 Difficulty in walking, not elsewhere classified: Secondary | ICD-10-CM | POA: Diagnosis not present

## 2023-07-04 DIAGNOSIS — M545 Low back pain, unspecified: Secondary | ICD-10-CM

## 2023-07-04 DIAGNOSIS — R278 Other lack of coordination: Secondary | ICD-10-CM

## 2023-07-04 DIAGNOSIS — R2689 Other abnormalities of gait and mobility: Secondary | ICD-10-CM

## 2023-07-04 DIAGNOSIS — R269 Unspecified abnormalities of gait and mobility: Secondary | ICD-10-CM

## 2023-07-04 DIAGNOSIS — R2681 Unsteadiness on feet: Secondary | ICD-10-CM

## 2023-07-04 NOTE — Therapy (Signed)
OUTPATIENT PHYSICAL THERAPY TREATMENT  Patient Name: Francisco [name] MRN: [id number] DOB:Sep 24, 1945, 77 y.o., male Today's Date: 07/05/2023  PCP: Lynnea Ferrier, MD REFERRING PROVIDER: Venetia Night MD   PT End of Session - 07/04/23 1408     Visit Number 82    Number of Visits 100    Date for PT Re-Evaluation 07/25/23    Authorization Type [employer] Medicare    Progress Note Due on Visit 90    PT Start Time 1402    PT Stop Time 1442    PT Time Calculation (min) 40 min    Equipment Utilized During Treatment Gait belt    Activity Tolerance Patient tolerated treatment well;Patient limited by fatigue;No increased pain    Behavior During Therapy WFL for tasks assessed/performed                        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   Carotid stenosis    CHF (congestive heart failure) (HCC)    CKD (chronic kidney disease)    CKD (chronic kidney disease) stage 3, GFR 30-59 ml/min (HCC)    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   DDD (degenerative disc disease), cervical    Dysrhythmia    post carotid stent bradycardia; PAF 09/2020   GERD (gastroesophageal reflux disease)    Hypercholesterolemia    Hypertension    Hypothyroidism    pt takes Levothyroxine daily   Lumbosacral spinal stenosis    Myasthenia gravis, adult form (HCC)    PAD (peripheral artery disease) (HCC)    Parkinson disease (HCC)    Shortness of breath    Lung MD- Dr Joetta Manners   Sleep apnea    do not use CPAP every night   Stroke (HCC) 05/31/2023   Past Surgical History:  Procedure Laterality Date   ANTERIOR CERVICAL DECOMP/DISCECTOMY FUSION  07/18/2011   Procedure: ANTERIOR  CERVICAL DECOMPRESSION/DISCECTOMY FUSION 2 LEVELS;  Surgeon: Kathaleen Maser Pool;  Location: [facility];  Service: Neurosurgery;  Laterality: N/A;  cervical five-six, cervical six-seven anterior cervical discectomy and fusion   ATRIAL FIBRILLATION ABLATION     BACK SURGERY     in 1985 [facility]   BILATERAL CARPAL TUNNEL RELEASE     01/2020 Right, 04/2020 Left   CARDIAC CATHETERIZATION     2005 at [facility], no stents   CAROTID PTA/STENT INTERVENTION N/A 09/17/2020   Procedure: CAROTID PTA/STENT INTERVENTION;  Surgeon: Annice Needy, MD;  Location: [facility] IN[facility] CV [facility];  Service: Cardiovascular;  Laterality: N/A;   CATARACT EXTRACTION W/PHACO Left 01/06/2020   Procedure: CATARACT EXTRACTION PHACO AND INTRAOCULAR LENS PLACEMENT (IOC) ISTENT INJ LEFT 3.81  00:33.3;  Surgeon: Nevada Crane, MD;  Location: [facility];  Service: Ophthalmology;  Laterality: Left;   CATARACT EXTRACTION W/PHACO Right 02/03/2020   Procedure: CATARACT EXTRACTION PHACO AND INTRAOCULAR LENS PLACEMENT (IOC) RIGHT ISTENT INJ;  Surgeon: Nevada Crane, MD;  Location: [facility];  Service: Ophthalmology;  Laterality: Right;  4.29 0:35.6   COLONOSCOPY     HERNIA REPAIR Left    inguinal hernia  repair in 1985   LUMBAR LAMINECTOMY/DECOMPRESSION MICRODISCECTOMY Left 02/24/2014   Procedure: LUMBAR LAMINECTOMY/DECOMPRESSION MICRODISCECTOMY LUMBAR THREE-FOUR, FOUR-FIVE, LEFT FIVE-SACRAL ONE ;  Surgeon: Temple Pacini, MD;  Location: [facility];  Service: Neurosurgery;  Laterality: Left;  LUMBAR LAMINECTOMY/DECOMPRESSION MICRODISCECTOMY LUMBAR THREE-FOUR, FOUR-FIVE, LEFT FIVE-SACRAL ONE    LUMBAR LAMINECTOMY/DECOMPRESSION MICRODISCECTOMY N/A 05/03/2021   Procedure: Laminectomy and Foraminotomy - L2-L3;  Surgeon: Julio Sicks, MD;  Location: [facility];  Service: Neurosurgery;  Laterality: N/A;  3C   POSTERIOR CERVICAL FUSION/FORAMINOTOMY N/A 08/07/2020   Procedure: C3-6 POSTERIOR FUSION WITH DECOMPRESSION;  Surgeon:  Venetia Night, MD;  Location: [facility] ORS;  Service: Neurosurgery;  Laterality: N/A;   PROSTATECTOMY  04/2013   [facility] Dr Joycelyn Das    Patient Active Problem List   Diagnosis Date Noted   Stroke [facility]) 05/31/2023   Obesity (BMI 30-39.9) 05/31/2023   HLD (hyperlipidemia) 05/31/2023   Chronic diastolic CHF (congestive heart failure) (HCC) 05/31/2023   Hyperkalemia 05/31/2023   SVT (supraventricular tachycardia) (HCC) 12/30/2021   Hypothyroidism 12/30/2021   Parkinson's disease (HCC) 12/30/2021   Elevated troponin 12/30/2021   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   Acquired thrombophilia (HCC) 01/13/2021   History of decompression of median nerve 01/01/2021   Paroxysmal atrial fibrillation (HCC) 10/06/2020   Carotid stenosis, right 09/17/2020   S/P cervical spinal fusion    Leukocytosis    Essential hypertension    Anemia of chronic disease    Postoperative pain    Neuropathic pain    Cervical myelopathy (HCC) 08/07/2020   Preop cardiovascular exam 07/17/2020   SOB (shortness of breath) on exertion 07/17/2020   Leg weakness, bilateral 07/13/2020   Aortic atherosclerosis (HCC) 07/09/2020   Body mass index (BMI) 34.0-34.9, adult 12/25/2019   Myalgia 10/30/2019   Lumbar post-laminectomy syndrome 10/24/2019   PAD (peripheral artery disease) (HCC) 06/06/2019   CKD (chronic kidney disease) stage 3, GFR 30-59 ml/min (HCC) 04/29/2019   B12 deficiency 01/23/2019   Left arm numbness 02/28/2018   Neck pain 02/28/2018   Anemia 02/02/2017   DDD (degenerative disc disease), cervical 02/02/2017   Hypothyroid 02/02/2017   MRSA (methicillin resistant staph aureus) culture positive 02/02/2017   Nocturnal hypoxia 02/02/2017   Senile purpura (HCC) 10/03/2016   Essential hypertension, benign 09/16/2016   Bilateral carotid artery disease (HCC) 09/16/2016   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/18/2016   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   History of prostate cancer  12/21/2015   Left carpal tunnel syndrome 11/11/2015   Kidney stone on left side 07/05/2015   Myasthenia gravis (HCC) 05/26/2015   Radiculitis 01/05/2015   Long-term use of high-risk medication 10/15/2014   Persistent cough 09/10/2014   Pure hypercholesterolemia 07/25/2014   Spinal stenosis, lumbar region, with neurogenic claudication 02/24/2014   Lumbosacral stenosis with neurogenic claudication 02/24/2014   COPD (chronic obstructive pulmonary disease) (HCC) 01/22/2014    REFERRING DIAG: balance disorder   THERAPY DIAG:  Difficulty in walking, not elsewhere classified  Muscle weakness (generalized)  Unsteadiness on feet  Other abnormalities of gait and mobility  Chronic bilateral low back pain, unspecified whether sciatica present  Other lack of coordination  Cervical myelopathy (HCC)  Abnormality of gait and mobility  Chronic left-sided low back pain with left-sided sciatica  Rationale for Evaluation and Treatment Rehabilitation  PERTINENT HISTORY: Mardell Hillers is a 77 year old male referred to OPPT neuro for difficulty with balance. He was also just recently dx in May 2023 with Impingement syndrome of left shoulder (M75.42)  Impingement syndrome of left shoulder (primary encounter diagnosis) Left shoulder pain, unspecified chronicity Nontraumatic rupture of left proximal biceps tendon Past medical hx includes Lumbar laminectomy 04/2021. C3-6 decompression, fusion on 11/26, myasthenia gravis, 2012 ACDF 5/6, 6/7; OSA, hypothyroidism, HTN, COPD, multiple back surgeries, 2021 carpal tunnel release.  PRECAUTIONS: Fall  SUBJECTIVE: Pt reported not feeling well and generalized weakness in bilat LEs. Pt reported burning sensation in bilat feet throughout the night and felt relief when feet were uncovered in his bed. Pt reported 3-4/10 on NPS in bilat hips in standing and 0/10 on NPS in sitting.         PAIN:  Are you having pain? R arm pain and R back/leg pain   TODAY'S  TREATMENT:    07/05/23   THEREX: BP: 115/68 in sitting L arm, 144/72 in standing L arm  Seated heel raises 2.5# AW- 2 sets x 10 reps  Seated toe raises 2.5# AW- 2 sets x 10 reps  Seated hip abd/add/flex - up/over 1/2 foam roll (unable on left LE today and x 12 reps on right )  Seated step tap onto 1/2 foam roll 2.5# AW x12 reps on left LE; Seated Hip march 2.5 # on right LE x 12 reps (Patient reports as very fatiguing)   Seated ham curl with GTB  x 10 reps Seated knee ext (2.5 #) AW x 10 reps      Pt educated throughout session about proper posture and technique with exercises. Improved exercise technique, movement at target joints, use of target muscles after min to mod verbal, visual, tactile cues.   PATIENT EDUCATION: Education details: Goals, plan, exercise technique, body mechanics, energy conservation techniques  Person educated: Patient Education method: Explanation, Demonstration, Tactile cues, Verbal cues, and Handouts Education comprehension: verbalized understanding, returned demonstration, verbal cues required, and tactile cues required   HOME EXERCISE PROGRAM:  No Updates today  Access Code: [email or web address] URL: [email or web address] Date: 03/01/2022 Prepared by: Precious Bard  Exercises - Seated March  - 1 x daily - 7 x weekly - 2 sets - 10 reps - 5 hold - Seated Long Arc Quad  - 1 x daily - 7 x weekly - 2 sets - 10 reps - 5 hold - Seated Hip Abduction  - 1 x daily - 7 x weekly - 2 sets - 10 reps - 5 hold - Seated Hip Adduction Isometrics with Ball  - 1 x daily - 7 x weekly - 2 sets - 10 reps - 5 hold - Seated Gluteal Sets  - 1 x daily - 7 x weekly - 2 sets - 10 reps - 5 hold - Seated Heel Toe Raises  - 1 x daily - 7 x weekly - 2 sets - 10 reps - 5 hold LE strength and balance      PT Short Term Goals       PT SHORT TERM GOAL #1   Title Pt will be independent with initial HEP in order to improve strength and balance in order to decrease fall risk  and improve function at home and work.    Baseline 02/23/2022- Patient reports not doing much as far as exercise in the home currently. 04/20/2022- Patient reports compliant with HEP and no questions at this time.; 9/7 pt indep   Time 6    Period Weeks    Status Goal met   Target Date 04/06/22              PT Long Term Goals  PT LONG TERM GOAL #1   Title Pt will be independent with final HEP in order to improve strength and balance in order to decrease fall risk and improve function at home and work. 04/20/2022- Patient reports compliant with HEP and no questions at this time.    Baseline 02/23/2022= No formal HEP in place. 9/7: pt performing at least once a day, feels comfortable   Time 12    Period Weeks    Status GOAL MET   Target Date 08/11/2022      PT LONG TERM GOAL #2   Title Pt will decrease 5TSTS by at least 8 seconds in order to demonstrate clinically significant improvement in LE strength.    Baseline 02/23/2022= 31.52 sec with B hands on knees; 04/20/2022= 16.08 sec with B hands on knees   Time 12    Period Days    Status Goal Met   Target Date 05/18/22      PT LONG TERM GOAL #3   Title Pt will increase by at least 0.23 m/s in order to demonstrate clinically significant improvement in community ambulation.    Baseline 02/23/2022= 0.39 m/s using RW; 0.45 m/s; 9/7: 0.37 m/s with QC, 0.74 m/s with RW   Time 12    Period Weeks    Status GOAL MET   Target Date 05/18/22      PT LONG TERM GOAL #4   Title Pt will improve FOTO to target score of 50 to display perceived improvements in ability to complete ADL's.    Baseline 02/23/2022= 47; 9/7: 49; 11/30=54%   Time 12    Period Weeks    Status GOAL MET   Target Date 08/11/2022      PT LONG TERM GOAL #5   Title Pt will decrease TUG to below 20 seconds/decrease in order to demonstrate decreased fall risk.    Baseline 02/23/2022=28.27 sec with RW; 04/20/2022= 20.22 with SBQC; 9/7: 14.4 sec with RW, 18.5 sec with SBQC    Time 12    Period Weeks    Status MET   Target Date 05/18/22    PT LONG TERM GOAL #6  Title Pt will decrease TUG to 14 sec or below with SBQC in order to demonstrate decreased fall risk.   Baseline  9/7: 14.4 sec with RW, 18.5 sec with SBQC; 11/30=16.85 sec without an AD. 09/15/2022= 22.14 sec with SPC; 11/08/2022= 25.17 sec without and AD and 21.22 sec with SPC. 01/31/2023= 22.64 sec without a AD and 24.64 sec with SBQC;  03/09/2023= 22.04 sec with SBQC and 14.88 sec with 4WW.  05/02/2023= 20.46 sec with SBQC and 14.22 sec with 4WW; 06/21/2023= 16.47 sec with SBQC.   Time 12   Period Weeks   Status ONGOING  Target Date 07/25/2023   PT LONG TERM GOAL #7  Title Patient will increase six minute walk test distance to >700 ft for improved gait ability and increased ease with community participation.  Baseline 9/7: completes 4 minutes and 444 ft with RW. Test discontinued at 4 minutes due to fatgiue. 08/11/2022= patient ambulated 200 feet yet required 1 seated rest break- due to BLE fatigue- using SBQC: 09/15/2022= Patient ambulated 300 feet in 4 min 20 sec with 1 seated rest break using SBQC. 11/08/2022 = 175 feet- stopped due to exhausted today- 2 min 10 sec; 01/17/2023= 200 feet in 2 min 38 sec; 02/02/2023= 160 feet with use of SBQC in 3 min 45 sec prior to stopping. 6/27 430 feet in  40 sec; 04/25/2023= 6 min complete - 500 feet using 4WW; 06/22/2023= 630 feet with 4WW  Time 12   Period Weeks   Status ONGOING  Target Date 07/25/2023   PT LONG TERM GOAL #8  Title Pt will decrease 5TSTS by at least 2 seconds (without UE support)  in order to demonstrate clinically significant improvement in LE strength.   Baseline 08/11/2022= 16.33 sec with B hands on knees; 09/15/2022= 22 sec with B hands on knees (patient reports increased weakness since around Christmas and just has not bounced back); 11/08/2022= 18.85 without UE Support; 01/17/2023=30.0 sec without UE support- patient reported not having a good day but  tried his best. 02/02/2023- 17.74 sec with UE Support; 03/09/2023 =15.82 sec without UE support; 04/25/2023= 15.77 sec; 06/21/2023= 17.09 sec without UE support.   Time 12   Period Weeks  Status ONGOING  Target Date 07/25/2023       PT LONG TERM GOAL #9  Title Pt will improve BERG by at least 3 points in order to demonstrate clinically significant improvement in balance.   Baseline 11/08/2022= 33/56; 01/17/2023= 45/56; 02/02/2023=45/56; 05/02/2023=45/56  Time 12   Period Weeks  Status ONGOING  Target Date 07/25/2023       Plan -     Clinical Impression Statement Treatment limited to seated LE strengthening secondary to patient not feeling well. He presents with some fatigue overall but improved some in session today. He was able to complete some therex with use of ankle weights but light today. Unable to progress in standing or dynamic balance due to increased overall fatigue presented today. Patient will continue to benefit from skilled PT services to address deficits and impairment identified in evaluation in order to maximize independence and safety in basic mobility required for performance of ADL, IADL, and leisure.    Personal Factors and Comorbidities Age;Past/Current Experience;Time since onset of injury/illness/exacerbation;Comorbidity 3+    Comorbidities COPD, History of Cancer, myasthenia gravis, chronic lumbar surgical history    Examination-Activity Limitations Lift;Squat;Bend;Stairs;Stand;Transfers;Probation officer;Bathing;Hygiene/Grooming;Dressing;Toileting;Bed Mobility;Caring for Others;Continence    Examination-Participation Restrictions Yard Work;Church;Cleaning;Driving;Community Activity    Stability/Clinical Decision Making Evolving/Moderate complexity    Rehab Potential Good    Clinical Impairments Affecting Rehab Potential multipe comorbidities    PT Frequency 1-2x / week    PT Duration 12 weeks    PT Treatment/Interventions ADLs/Self Care Home  Management;Electrical Stimulation;Moist Heat;Traction;Ultrasound;Gait training;Stair training;Functional mobility training;Therapeutic activities;Therapeutic exercise;Balance training;Neuromuscular re-education;Patient/family education;Manual techniques;Passive range of motion;Dry needling;Joint Manipulations;Cryotherapy;Vestibular;Canalith Repostioning    PT Next Visit Plan Progress LE strengthening, balance training,  and functional endurance.   PT Home Exercise Plan standing strengthening and balance, seated strengthening and coordination.    Consulted and Agree with Plan of Care Patient            5:32 PM, 07/05/23    Lenda Kelp PT Physical Therapist - [facility]  Outpatient Physical Therapy- Main Campus [phone]     07/05/23, 5:32 PM

## 2023-07-06 ENCOUNTER — Ambulatory Visit: Payer: Medicare Other

## 2023-07-11 ENCOUNTER — Ambulatory Visit: Payer: Medicare Other

## 2023-07-13 ENCOUNTER — Ambulatory Visit: Payer: Medicare Other

## 2023-07-13 DIAGNOSIS — M545 Low back pain, unspecified: Secondary | ICD-10-CM

## 2023-07-13 DIAGNOSIS — M6281 Muscle weakness (generalized): Secondary | ICD-10-CM

## 2023-07-13 DIAGNOSIS — R2689 Other abnormalities of gait and mobility: Secondary | ICD-10-CM

## 2023-07-13 DIAGNOSIS — R278 Other lack of coordination: Secondary | ICD-10-CM

## 2023-07-13 DIAGNOSIS — R262 Difficulty in walking, not elsewhere classified: Secondary | ICD-10-CM | POA: Diagnosis not present

## 2023-07-13 DIAGNOSIS — R2681 Unsteadiness on feet: Secondary | ICD-10-CM

## 2023-07-13 NOTE — Therapy (Signed)
OUTPATIENT PHYSICAL THERAPY TREATMENT  Patient Name: Francisco [name] MRN: [id number] DOB:[birth year], 77 y.o., male Today's Date: 07/13/2023  PCP: Lynnea Ferrier, MD REFERRING PROVIDER: Venetia Night MD   PT End of Session - 07/13/23 1407     Visit Number 83    Number of Visits 100    Date for PT Re-Evaluation 07/25/23    Authorization Type [employer] Medicare    Progress Note Due on Visit 90    PT Start Time 1400    PT Stop Time 1443    PT Time Calculation (min) 43 min    Equipment Utilized During Treatment Gait belt    Activity Tolerance Patient tolerated treatment well;Patient limited by fatigue;No increased pain    Behavior During Therapy WFL for tasks assessed/performed                        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   Carotid stenosis    CHF (congestive heart failure) (HCC)    CKD (chronic kidney disease)    CKD (chronic kidney disease) stage 3, GFR 30-59 ml/min (HCC)    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   DDD (degenerative disc disease), cervical    Dysrhythmia    post carotid stent bradycardia; PAF 09/2020   GERD (gastroesophageal reflux disease)    Hypercholesterolemia    Hypertension    Hypothyroidism    pt takes Levothyroxine daily   Lumbosacral spinal stenosis    Myasthenia gravis, adult form (HCC)    PAD (peripheral artery disease) (HCC)    Parkinson disease (HCC)    Shortness of breath    Lung MD- Dr Joetta Manners   Sleep apnea    do not use CPAP every night   Stroke (HCC) 05/31/2023   Past Surgical History:  Procedure Laterality Date   ANTERIOR CERVICAL DECOMP/DISCECTOMY FUSION  07/18/2011   Procedure: ANTERIOR  CERVICAL DECOMPRESSION/DISCECTOMY FUSION 2 LEVELS;  Surgeon: Kathaleen Maser Pool;  Location: [facility];  Service: Neurosurgery;  Laterality: N/A;  cervical five-six, cervical six-seven anterior cervical discectomy and fusion   ATRIAL FIBRILLATION ABLATION     BACK SURGERY     in 1985 [facility]   BILATERAL CARPAL TUNNEL RELEASE     01/2020 Right, 04/2020 Left   CARDIAC CATHETERIZATION     2005 at [facility], no stents   CAROTID PTA/STENT INTERVENTION N/A 09/17/2020   Procedure: CAROTID PTA/STENT INTERVENTION;  Surgeon: Annice Needy, MD;  Location: [facility];  Service: Cardiovascular;  Laterality: N/A;   CATARACT EXTRACTION W/PHACO Left 01/06/2020   Procedure: CATARACT EXTRACTION PHACO AND INTRAOCULAR LENS PLACEMENT (IOC) ISTENT INJ LEFT 3.81  00:33.3;  Surgeon: Nevada Crane, MD;  Location: [facility];  Service: Ophthalmology;  Laterality: Left;   CATARACT EXTRACTION W/PHACO Right 02/03/2020   Procedure: CATARACT EXTRACTION PHACO AND INTRAOCULAR LENS PLACEMENT (IOC) RIGHT ISTENT INJ;  Surgeon: Nevada Crane, MD;  Location: [facility];  Service: Ophthalmology;  Laterality: Right;  4.29 0:35.6   COLONOSCOPY     HERNIA REPAIR Left    inguinal hernia  repair in 1985   LUMBAR LAMINECTOMY/DECOMPRESSION MICRODISCECTOMY Left 02/24/2014   Procedure: LUMBAR LAMINECTOMY/DECOMPRESSION MICRODISCECTOMY LUMBAR THREE-FOUR, FOUR-FIVE, LEFT FIVE-SACRAL ONE ;  Surgeon: Temple Pacini, MD;  Location: [facility];  Service: Neurosurgery;  Laterality: Left;  LUMBAR LAMINECTOMY/DECOMPRESSION MICRODISCECTOMY LUMBAR THREE-FOUR, FOUR-FIVE, LEFT FIVE-SACRAL ONE    LUMBAR LAMINECTOMY/DECOMPRESSION MICRODISCECTOMY N/A 05/03/2021   Procedure: Laminectomy and Foraminotomy - L2-L3;  Surgeon: Julio Sicks, MD;  Location: [facility];  Service: Neurosurgery;  Laterality: N/A;  3C   POSTERIOR CERVICAL FUSION/FORAMINOTOMY N/A 08/07/2020   Procedure: C3-6 POSTERIOR FUSION WITH DECOMPRESSION;  Surgeon:  Venetia Night, MD;  Location: [facility] OR[facility];  Service: Neurosurgery;  Laterality: N/A;   PROSTATECTOMY  04/2013   [facility] Dr Joycelyn Das    Patient Active Problem List   Diagnosis Date Noted   Stroke [facility]) 05/31/2023   Obesity (BMI 30-39.9) 05/31/2023   HLD (hyperlipidemia) 05/31/2023   Chronic diastolic CHF (congestive heart failure) (HCC) 05/31/2023   Hyperkalemia 05/31/2023   SVT (supraventricular tachycardia) (HCC) 12/30/2021   Hypothyroidism 12/30/2021   Parkinson's disease (HCC) 12/30/2021   Elevated troponin 12/30/2021   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   Acquired thrombophilia (HCC) 01/13/2021   History of decompression of median nerve 01/01/2021   Paroxysmal atrial fibrillation (HCC) 10/06/2020   Carotid stenosis, right 09/17/2020   S/P cervical spinal fusion    Leukocytosis    Essential hypertension    Anemia of chronic disease    Postoperative pain    Neuropathic pain    Cervical myelopathy (HCC) 08/07/2020   Preop cardiovascular exam 07/17/2020   SOB (shortness of breath) on exertion 07/17/2020   Leg weakness, bilateral 07/13/2020   Aortic atherosclerosis (HCC) 07/09/2020   Body mass index (BMI) 34.0-34.9, adult 12/25/2019   Myalgia 10/30/2019   Lumbar post-laminectomy syndrome 10/24/2019   PAD (peripheral artery disease) (HCC) 06/06/2019   CKD (chronic kidney disease) stage 3, GFR 30-59 ml/min (HCC) 04/29/2019   B12 deficiency 01/23/2019   Left arm numbness 02/28/2018   Neck pain 02/28/2018   Anemia 02/02/2017   DDD (degenerative disc disease), cervical 02/02/2017   Hypothyroid 02/02/2017   MRSA (methicillin resistant staph aureus) culture positive 02/02/2017   Nocturnal hypoxia 02/02/2017   Senile purpura (HCC) 10/03/2016   Essential hypertension, benign 09/16/2016   Bilateral carotid artery disease (HCC) 09/16/2016   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/18/2016   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   History of prostate cancer  12/21/2015   Left carpal tunnel syndrome 11/11/2015   Kidney stone on left side 07/05/2015   Myasthenia gravis (HCC) 05/26/2015   Radiculitis 01/05/2015   Long-term use of high-risk medication 10/15/2014   Persistent cough 09/10/2014   Pure hypercholesterolemia 07/25/2014   Spinal stenosis, lumbar region, with neurogenic claudication 02/24/2014   Lumbosacral stenosis with neurogenic claudication 02/24/2014   COPD (chronic obstructive pulmonary disease) (HCC) 01/22/2014    REFERRING DIAG: balance disorder   THERAPY DIAG:  Difficulty in walking, not elsewhere classified  Muscle weakness (generalized)  Unsteadiness on feet  Other abnormalities of gait and mobility  Chronic bilateral low back pain, unspecified whether sciatica present  Other lack of coordination  Rationale for Evaluation and Treatment Rehabilitation  PERTINENT HISTORY: Francisco [name] is a 77 year old male referred to [facility] neuro for difficulty with balance. He was also just recently dx in May 2023 with Impingement syndrome of left shoulder (M75.42) Impingement syndrome of left shoulder (primary encounter diagnosis) Left shoulder pain, unspecified chronicity Nontraumatic rupture of left proximal biceps  tendon Past medical hx includes Lumbar laminectomy 04/2021. C3-6 decompression, fusion on 11/26, myasthenia gravis, 2012 ACDF 5/6, 6/7; OSA, hypothyroidism, HTN, COPD, multiple back surgeries, 2021 carpal tunnel release.  PRECAUTIONS: Fall  SUBJECTIVE: Pt reported ongoing LE weakness and states emotional about medical health of his spouse.         PAIN:  Are you having pain? R arm pain and R back/leg pain   TODAY'S TREATMENT:    07/13/23   Seated hip march 2.5 AW - 2sets x 10 reps Seated heel raises 2.5# AW- 2 sets x 10 reps  Seated toe raises 2.5# AW- 2 sets x 10 reps  Seated hip abd/add/flex - up/over 1/2 foam roll (unable on left LE today and x 12 reps on right )  Seated step tap onto 1/2 foam roll 2.5#  AW x12 reps on left LE; Seated Hip march 2.5 # on right LE x 12 reps (Patient reports as very fatiguing)   Seated ham curl with GTB  2 sets x 10 reps Seated knee ext (2.5 #) AW 2 sets x 10 reps Seated hip ext GTB 2 sets of 10 reps      Pt educated throughout session about proper posture and technique with exercises. Improved exercise technique, movement at target joints, use of target muscles after min to mod verbal, visual, tactile cues.   PATIENT EDUCATION: Education details: Goals, plan, exercise technique, body mechanics, energy conservation techniques  Person educated: Patient Education method: Explanation, Demonstration, Tactile cues, Verbal cues, and Handouts Education comprehension: verbalized understanding, returned demonstration, verbal cues required, and tactile cues required   HOME EXERCISE PROGRAM:  No Updates today  Access Code: [email or web address] URL: [email or web address] Date: 03/01/2022 Prepared by: Precious Bard  Exercises - Seated March  - 1 x daily - 7 x weekly - 2 sets - 10 reps - 5 hold - Seated Long Arc Quad  - 1 x daily - 7 x weekly - 2 sets - 10 reps - 5 hold - Seated Hip Abduction  - 1 x daily - 7 x weekly - 2 sets - 10 reps - 5 hold - Seated Hip Adduction Isometrics with Ball  - 1 x daily - 7 x weekly - 2 sets - 10 reps - 5 hold - Seated Gluteal Sets  - 1 x daily - 7 x weekly - 2 sets - 10 reps - 5 hold - Seated Heel Toe Raises  - 1 x daily - 7 x weekly - 2 sets - 10 reps - 5 hold LE strength and balance      PT Short Term Goals       PT SHORT TERM GOAL #1   Title Pt will be independent with initial HEP in order to improve strength and balance in order to decrease fall risk and improve function at home and work.    Baseline 02/23/2022- Patient reports not doing much as far as exercise in the home currently. 04/20/2022- Patient reports compliant with HEP and no questions at this time.; 9/7 pt indep   Time 6    Period Weeks    Status Goal met    Target Date 04/06/22              PT Long Term Goals       PT LONG TERM GOAL #1   Title Pt will be independent with final HEP in order to improve strength and balance in order to decrease fall risk and improve function at home and work. 04/20/2022-  Patient reports compliant with HEP and no questions at this time.    Baseline 02/23/2022= No formal HEP in place. 9/7: pt performing at least once a day, feels comfortable   Time 12    Period Weeks    Status GOAL MET   Target Date 08/11/2022      PT LONG TERM GOAL #2   Title Pt will decrease 5TSTS by at least 8 seconds in order to demonstrate clinically significant improvement in LE strength.    Baseline 02/23/2022= 31.52 sec with B hands on knees; 04/20/2022= 16.08 sec with B hands on knees   Time 12    Period Days    Status Goal Met   Target Date 05/18/22      PT LONG TERM GOAL #3   Title Pt will increase by at least 0.23 m/s in order to demonstrate clinically significant improvement in community ambulation.    Baseline 02/23/2022= 0.39 m/s using RW; 0.45 m/s; 9/7: 0.37 m/s with QC, 0.74 m/s with RW   Time 12    Period Weeks    Status GOAL MET   Target Date 05/18/22      PT LONG TERM GOAL #4   Title Pt will improve FOTO to target score of 50 to display perceived improvements in ability to complete ADL's.    Baseline 02/23/2022= 47; 9/7: 49; 11/30=54%   Time 12    Period Weeks    Status GOAL MET   Target Date 08/11/2022      PT LONG TERM GOAL #5   Title Pt will decrease TUG to below 20 seconds/decrease in order to demonstrate decreased fall risk.    Baseline 02/23/2022=28.27 sec with RW; 04/20/2022= 20.22 with SBQC; 9/7: 14.4 sec with RW, 18.5 sec with SBQC   Time 12    Period Weeks    Status MET   Target Date 05/18/22    PT LONG TERM GOAL #6  Title Pt will decrease TUG to 14 sec or below with SBQC in order to demonstrate decreased fall risk.   Baseline  9/7: 14.4 sec with RW, 18.5 sec with SBQC; 11/30=16.85 sec without an  AD. 09/15/2022= 22.14 sec with SPC; 11/08/2022= 25.17 sec without and AD and 21.22 sec with SPC. 01/31/2023= 22.64 sec without a AD and 24.64 sec with SBQC;  03/09/2023= 22.04 sec with SBQC and 14.88 sec with 4WW.  05/02/2023= 20.46 sec with SBQC and 14.22 sec with 4WW; 06/21/2023= 16.47 sec with SBQC.   Time 12   Period Weeks   Status ONGOING  Target Date 07/25/2023   PT LONG TERM GOAL #7  Title Patient will increase six minute walk test distance to >700 ft for improved gait ability and increased ease with community participation.  Baseline 9/7: completes 4 minutes and 444 ft with RW. Test discontinued at 4 minutes due to fatgiue. 08/11/2022= patient ambulated 200 feet yet required 1 seated rest break- due to BLE fatigue- using SBQC: 09/15/2022= Patient ambulated 300 feet in 4 min 20 sec with 1 seated rest break using SBQC. 11/08/2022 = 175 feet- stopped due to exhausted today- 2 min 10 sec; 01/17/2023= 200 feet in 2 min 38 sec; 02/02/2023= 160 feet with use of SBQC in 3 min 45 sec prior to stopping. 6/27 430 feet in 40 sec; 04/25/2023= 6 min complete - 500 feet using 4WW; 06/22/2023= 630 feet with 4WW  Time 12   Period Weeks   Status ONGOING  Target Date 07/25/2023   PT LONG TERM  GOAL #8  Title Pt will decrease 5TSTS by at least 2 seconds (without UE support)  in order to demonstrate clinically significant improvement in LE strength.   Baseline 08/11/2022= 16.33 sec with B hands on knees; 09/15/2022= 22 sec with B hands on knees (patient reports increased weakness since around Christmas and just has not bounced back); 11/08/2022= 18.85 without UE Support; 01/17/2023=30.0 sec without UE support- patient reported not having a good day but tried his best. 02/02/2023- 17.74 sec with UE Support; 03/09/2023 =15.82 sec without UE support; 04/25/2023= 15.77 sec; 06/21/2023= 17.09 sec without UE support.   Time 12   Period Weeks  Status ONGOING  Target Date 07/25/2023       PT LONG TERM GOAL #9  Title Pt will  improve BERG by at least 3 points in order to demonstrate clinically significant improvement in balance.   Baseline 11/08/2022= 33/56; 01/17/2023= 45/56; 02/02/2023=45/56; 05/02/2023=45/56  Time 12   Period Weeks  Status ONGOING  Target Date 07/25/2023       Plan -     Clinical Impression Statement Patient continues to be motivated for visit but presents with increased overall weakness and difficulty with mobility. Treatment limited to mostly sitting therex yet patient was able to perform mostly 2 sets of all therex with minimal resting. Patient will continue to benefit from skilled PT services to address deficits and impairment identified in evaluation in order to maximize independence and safety in basic mobility required for performance of ADL, IADL, and leisure.    Personal Factors and Comorbidities Age;Past/Current Experience;Time since onset of injury/illness/exacerbation;Comorbidity 3+    Comorbidities COPD, History of Cancer, myasthenia gravis, chronic lumbar surgical history    Examination-Activity Limitations Lift;Squat;Bend;Stairs;Stand;Transfers;Probation officer;Bathing;Hygiene/Grooming;Dressing;Toileting;Bed Mobility;Caring for Others;Continence    Examination-Participation Restrictions Yard Work;Church;Cleaning;Driving;Community Activity    Stability/Clinical Decision Making Evolving/Moderate complexity    Rehab Potential Good    Clinical Impairments Affecting Rehab Potential multipe comorbidities    PT Frequency 1-2x / week    PT Duration 12 weeks    PT Treatment/Interventions ADLs/Self Care Home Management;Electrical Stimulation;Moist Heat;Traction;Ultrasound;Gait training;Stair training;Functional mobility training;Therapeutic activities;Therapeutic exercise;Balance training;Neuromuscular re-education;Patient/family education;Manual techniques;Passive range of motion;Dry needling;Joint Manipulations;Cryotherapy;Vestibular;Canalith Repostioning    PT Next Visit  Plan Progress LE strengthening, balance training,  and functional endurance.   PT Home Exercise Plan standing strengthening and balance, seated strengthening and coordination.    Consulted and Agree with Plan of Care Patient            5:20 PM, 07/13/23  Debara Pickett, SPT  This entire session was performed under direct supervision and direction of a licensed therapist/therapist assistant . I have personally read, edited and approve of the note as written.   Lenda Kelp PT Physical Therapist - [facility]  Outpatient Physical Therapy- Main Campus [phone]     07/13/23, 5:20 PM

## 2023-07-18 ENCOUNTER — Ambulatory Visit: Payer: Medicare Other | Attending: Neurosurgery

## 2023-07-18 DIAGNOSIS — M6281 Muscle weakness (generalized): Secondary | ICD-10-CM | POA: Insufficient documentation

## 2023-07-18 DIAGNOSIS — R296 Repeated falls: Secondary | ICD-10-CM | POA: Diagnosis present

## 2023-07-18 DIAGNOSIS — R278 Other lack of coordination: Secondary | ICD-10-CM | POA: Diagnosis present

## 2023-07-18 DIAGNOSIS — G8929 Other chronic pain: Secondary | ICD-10-CM | POA: Insufficient documentation

## 2023-07-18 DIAGNOSIS — R269 Unspecified abnormalities of gait and mobility: Secondary | ICD-10-CM | POA: Diagnosis present

## 2023-07-18 DIAGNOSIS — M545 Low back pain, unspecified: Secondary | ICD-10-CM | POA: Insufficient documentation

## 2023-07-18 DIAGNOSIS — R2689 Other abnormalities of gait and mobility: Secondary | ICD-10-CM | POA: Insufficient documentation

## 2023-07-18 DIAGNOSIS — G959 Disease of spinal cord, unspecified: Secondary | ICD-10-CM | POA: Insufficient documentation

## 2023-07-18 DIAGNOSIS — R2681 Unsteadiness on feet: Secondary | ICD-10-CM | POA: Insufficient documentation

## 2023-07-18 DIAGNOSIS — R262 Difficulty in walking, not elsewhere classified: Secondary | ICD-10-CM | POA: Diagnosis present

## 2023-07-18 DIAGNOSIS — M5442 Lumbago with sciatica, left side: Secondary | ICD-10-CM | POA: Insufficient documentation

## 2023-07-18 NOTE — Therapy (Signed)
OUTPATIENT PHYSICAL THERAPY TREATMENT  Patient Name: Francisco [name] MRN: [id number] DOB:22-Sep-1945, 77 y.o., male Today's Date: 07/18/2023  PCP: Lynnea Ferrier, MD REFERRING PROVIDER: Venetia Night MD   PT End of Session - 07/18/23 1356     Visit Number 84    Number of Visits 100    Date for PT Re-Evaluation 07/25/23    Authorization Type [employer] Medicare    Authorization Time Period 02/02/23-04/27/23    Progress Note Due on Visit 90    PT Start Time 1402    PT Stop Time 1443    PT Time Calculation (min) 41 min    Equipment Utilized During Treatment Gait belt    Activity Tolerance Patient tolerated treatment well;Patient limited by fatigue;No increased pain    Behavior During Therapy WFL for tasks assessed/performed                        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   Carotid stenosis    CHF (congestive heart failure) (HCC)    CKD (chronic kidney disease)    CKD (chronic kidney disease) stage 3, GFR 30-59 ml/min (HCC)    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   DDD (degenerative disc disease), cervical    Dysrhythmia    post carotid stent bradycardia; PAF 09/2020   GERD (gastroesophageal reflux disease)    Hypercholesterolemia    Hypertension    Hypothyroidism    pt takes Levothyroxine daily   Lumbosacral spinal stenosis    Myasthenia gravis, adult form (HCC)    PAD (peripheral artery disease) (HCC)    Parkinson disease (HCC)    Shortness of breath    Lung MD- Dr Joetta Manners   Sleep apnea    do not use CPAP every night   Stroke (HCC) 05/31/2023   Past Surgical History:  Procedure Laterality Date   ANTERIOR CERVICAL DECOMP/DISCECTOMY  FUSION  07/18/2011   Procedure: ANTERIOR CERVICAL DECOMPRESSION/DISCECTOMY FUSION 2 LEVELS;  Surgeon: Kathaleen Maser Pool;  Location: [facility];  Service: Neurosurgery;  Laterality: N/A;  cervical five-six, cervical six-seven anterior cervical discectomy and fusion   ATRIAL FIBRILLATION ABLATION     BACK SURGERY     in 1985 [facility]   BILATERAL CARPAL TUNNEL RELEASE     01/2020 Right, 04/2020 Left   CARDIAC CATHETERIZATION     2005 at [facility], no stents   CAROTID PTA/STENT INTERVENTION N/A 09/17/2020   Procedure: CAROTID PTA/STENT INTERVENTION;  Surgeon: Annice Needy, MD;  Location: [facility];  Service: Cardiovascular;  Laterality: N/A;   CATARACT EXTRACTION W/PHACO Left 01/06/2020   Procedure: CATARACT EXTRACTION PHACO AND INTRAOCULAR LENS PLACEMENT (IOC) ISTENT INJ LEFT 3.81  00:33.3;  Surgeon: Nevada Crane, MD;  Location: [facility];  Service: Ophthalmology;  Laterality: Left;   CATARACT EXTRACTION W/PHACO Right 02/03/2020   Procedure: CATARACT EXTRACTION PHACO AND INTRAOCULAR LENS PLACEMENT (IOC) RIGHT ISTENT INJ;  Surgeon: Nevada Crane, MD;  Location: [facility];  Service: Ophthalmology;  Laterality: Right;  4.29 0:35.6   COLONOSCOPY     HERNIA  REPAIR Left    inguinal hernia repair in 1985   LUMBAR LAMINECTOMY/DECOMPRESSION MICRODISCECTOMY Left 02/24/2014   Procedure: LUMBAR LAMINECTOMY/DECOMPRESSION MICRODISCECTOMY LUMBAR THREE-FOUR, FOUR-FIVE, LEFT FIVE-SACRAL ONE ;  Surgeon: Temple Pacini, MD;  Location: [facility];  Service: Neurosurgery;  Laterality: Left;  LUMBAR LAMINECTOMY/DECOMPRESSION MICRODISCECTOMY LUMBAR THREE-FOUR, FOUR-FIVE, LEFT FIVE-SACRAL ONE    LUMBAR LAMINECTOMY/DECOMPRESSION MICRODISCECTOMY N/A 05/03/2021   Procedure: Laminectomy and Foraminotomy - L2-L3;  Surgeon: Julio Sicks, MD;  Location: [facility];  Service: Neurosurgery;  Laterality: N/A;  3C   POSTERIOR CERVICAL FUSION/FORAMINOTOMY N/A 08/07/2020   Procedure: C3-6 POSTERIOR  FUSION WITH DECOMPRESSION;  Surgeon: Venetia Night, MD;  Location: [facility] OR[facility];  Service: Neurosurgery;  Laterality: N/A;   PROSTATECTOMY  04/2013   [facility] Dr Joycelyn Das    Patient Active Problem List   Diagnosis Date Noted   Stroke [facility]) 05/31/2023   Obesity (BMI 30-39.9) 05/31/2023   HLD (hyperlipidemia) 05/31/2023   Chronic diastolic CHF (congestive heart failure) (HCC) 05/31/2023   Hyperkalemia 05/31/2023   SVT (supraventricular tachycardia) (HCC) 12/30/2021   Hypothyroidism 12/30/2021   Parkinson's disease (HCC) 12/30/2021   Elevated troponin 12/30/2021   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   Acquired thrombophilia (HCC) 01/13/2021   History of decompression of median nerve 01/01/2021   Paroxysmal atrial fibrillation (HCC) 10/06/2020   Carotid stenosis, right 09/17/2020   S/P cervical spinal fusion    Leukocytosis    Essential hypertension    Anemia of chronic disease    Postoperative pain    Neuropathic pain    Cervical myelopathy (HCC) 08/07/2020   Preop cardiovascular exam 07/17/2020   SOB (shortness of breath) on exertion 07/17/2020   Leg weakness, bilateral 07/13/2020   Aortic atherosclerosis (HCC) 07/09/2020   Body mass index (BMI) 34.0-34.9, adult 12/25/2019   Myalgia 10/30/2019   Lumbar post-laminectomy syndrome 10/24/2019   PAD (peripheral artery disease) (HCC) 06/06/2019   CKD (chronic kidney disease) stage 3, GFR 30-59 ml/min (HCC) 04/29/2019   B12 deficiency 01/23/2019   Left arm numbness 02/28/2018   Neck pain 02/28/2018   Anemia 02/02/2017   DDD (degenerative disc disease), cervical 02/02/2017   Hypothyroid 02/02/2017   MRSA (methicillin resistant staph aureus) culture positive 02/02/2017   Nocturnal hypoxia 02/02/2017   Senile purpura (HCC) 10/03/2016   Essential hypertension, benign 09/16/2016   Bilateral carotid artery disease (HCC) 09/16/2016   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/18/2016   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   History of prostate cancer 12/21/2015   Left carpal tunnel syndrome 11/11/2015   Kidney stone on left side 07/05/2015   Myasthenia gravis (HCC) 05/26/2015   Radiculitis 01/05/2015   Long-term use of high-risk medication 10/15/2014   Persistent cough 09/10/2014   Pure hypercholesterolemia 07/25/2014   Spinal stenosis, lumbar region, with neurogenic claudication 02/24/2014   Lumbosacral stenosis with neurogenic claudication 02/24/2014   COPD (chronic obstructive pulmonary disease) (HCC) 01/22/2014    REFERRING DIAG: balance disorder   THERAPY DIAG:  Difficulty in walking, not elsewhere classified  Abnormality of gait and mobility  Chronic left-sided low back pain with left-sided sciatica  Muscle weakness (generalized)  Repeated falls  Unsteadiness on feet  Other abnormalities of gait and mobility  Chronic bilateral low back pain, unspecified whether sciatica present  Other lack of coordination  Rationale for Evaluation and Treatment Rehabilitation  PERTINENT HISTORY: Ubaldo Daywalt is a 77 year old male referred to OPPT neuro for difficulty with balance. He was also just recently dx in May 2023 with  Impingement syndrome of left shoulder (M75.42) Impingement syndrome of left shoulder (primary encounter diagnosis) Left shoulder pain, unspecified chronicity Nontraumatic rupture of left proximal biceps tendon Past medical hx includes Lumbar laminectomy 04/2021. C3-6 decompression, fusion on 11/26, myasthenia gravis, 2012 ACDF 5/6, 6/7; OSA, hypothyroidism, HTN, COPD, multiple back surgeries, 2021 carpal tunnel release.  PRECAUTIONS: Fall  SUBJECTIVE: Pt reported 5/10 on NPS in bilateral LE's. Pt reported 6/10 on NPS in lower back. Pt stated that he relaxed over the weekend.     PAIN:  Are you having pain? R arm pain and R back/leg pain   TODAY'S TREATMENT:    07/18/23   TE Seated hip march 2.5 AW - 2sets x 10 reps Seated heel raises 2.5# AW- 2 sets x 10  reps  Seated toe raises 2.5# AW- 2 sets x 10 reps  Seated step over half foam roller 2.5# AW 10 reps each LE Seated step tap onto 1/2 foam roll 2.5# AW 2x10 reps each LE Seated ham curl with GTB  2 sets x 10 reps Seated knee ext (2.5 #) AW 2 sets x 10 reps Standing hip extensions 10 reps each LE Standing hip abductions 5 reps each LE (limited due to fatigue) STS 2x5 no UE support      Pt educated throughout session about proper posture and technique with exercises. Improved exercise technique, movement at target joints, use of target muscles after min to mod verbal, visual, tactile cues.   PATIENT EDUCATION: Education details: Goals, plan, exercise technique, body mechanics, energy conservation techniques  Person educated: Patient Education method: Explanation, Demonstration, Tactile cues, Verbal cues, and Handouts Education comprehension: verbalized understanding, returned demonstration, verbal cues required, and tactile cues required   HOME EXERCISE PROGRAM:  No Updates today  Access Code: [email or web address] URL: [email or web address] Date: 03/01/2022 Prepared by: Precious Bard  Exercises - Seated March  - 1 x daily - 7 x weekly - 2 sets - 10 reps - 5 hold - Seated Long Arc Quad  - 1 x daily - 7 x weekly - 2 sets - 10 reps - 5 hold - Seated Hip Abduction  - 1 x daily - 7 x weekly - 2 sets - 10 reps - 5 hold - Seated Hip Adduction Isometrics with Ball  - 1 x daily - 7 x weekly - 2 sets - 10 reps - 5 hold - Seated Gluteal Sets  - 1 x daily - 7 x weekly - 2 sets - 10 reps - 5 hold - Seated Heel Toe Raises  - 1 x daily - 7 x weekly - 2 sets - 10 reps - 5 hold LE strength and balance      PT Short Term Goals       PT SHORT TERM GOAL #1   Title Pt will be independent with initial HEP in order to improve strength and balance in order to decrease fall risk and improve function at home and work.    Baseline 02/23/2022- Patient reports not doing much as far as exercise in the  home currently. 04/20/2022- Patient reports compliant with HEP and no questions at this time.; 9/7 pt indep   Time 6    Period Weeks    Status Goal met   Target Date 04/06/22              PT Long Term Goals       PT LONG TERM GOAL #1   Title Pt will be independent with final HEP in order to  improve strength and balance in order to decrease fall risk and improve function at home and work. 04/20/2022- Patient reports compliant with HEP and no questions at this time.    Baseline 02/23/2022= No formal HEP in place. 9/7: pt performing at least once a day, feels comfortable   Time 12    Period Weeks    Status GOAL MET   Target Date 08/11/2022      PT LONG TERM GOAL #2   Title Pt will decrease 5TSTS by at least 8 seconds in order to demonstrate clinically significant improvement in LE strength.    Baseline 02/23/2022= 31.52 sec with B hands on knees; 04/20/2022= 16.08 sec with B hands on knees   Time 12    Period Days    Status Goal Met   Target Date 05/18/22      PT LONG TERM GOAL #3   Title Pt will increase by at least 0.23 m/s in order to demonstrate clinically significant improvement in community ambulation.    Baseline 02/23/2022= 0.39 m/s using RW; 0.45 m/s; 9/7: 0.37 m/s with QC, 0.74 m/s with RW   Time 12    Period Weeks    Status GOAL MET   Target Date 05/18/22      PT LONG TERM GOAL #4   Title Pt will improve FOTO to target score of 50 to display perceived improvements in ability to complete ADL's.    Baseline 02/23/2022= 47; 9/7: 49; 11/30=54%   Time 12    Period Weeks    Status GOAL MET   Target Date 08/11/2022      PT LONG TERM GOAL #5   Title Pt will decrease TUG to below 20 seconds/decrease in order to demonstrate decreased fall risk.    Baseline 02/23/2022=28.27 sec with RW; 04/20/2022= 20.22 with SBQC; 9/7: 14.4 sec with RW, 18.5 sec with SBQC   Time 12    Period Weeks    Status MET   Target Date 05/18/22    PT LONG TERM GOAL #6  Title Pt will decrease TUG  to 14 sec or below with SBQC in order to demonstrate decreased fall risk.   Baseline  9/7: 14.4 sec with RW, 18.5 sec with SBQC; 11/30=16.85 sec without an AD. 09/15/2022= 22.14 sec with SPC; 11/08/2022= 25.17 sec without and AD and 21.22 sec with SPC. 01/31/2023= 22.64 sec without a AD and 24.64 sec with SBQC;  03/09/2023= 22.04 sec with SBQC and 14.88 sec with 4WW.  05/02/2023= 20.46 sec with SBQC and 14.22 sec with 4WW; 06/21/2023= 16.47 sec with SBQC.   Time 12   Period Weeks   Status ONGOING  Target Date 07/25/2023   PT LONG TERM GOAL #7  Title Patient will increase six minute walk test distance to >700 ft for improved gait ability and increased ease with community participation.  Baseline 9/7: completes 4 minutes and 444 ft with RW. Test discontinued at 4 minutes due to fatgiue. 08/11/2022= patient ambulated 200 feet yet required 1 seated rest break- due to BLE fatigue- using SBQC: 09/15/2022= Patient ambulated 300 feet in 4 min 20 sec with 1 seated rest break using SBQC. 11/08/2022 = 175 feet- stopped due to exhausted today- 2 min 10 sec; 01/17/2023= 200 feet in 2 min 38 sec; 02/02/2023= 160 feet with use of SBQC in 3 min 45 sec prior to stopping. 6/27 430 feet in 40 sec; 04/25/2023= 6 min complete - 500 feet using 4WW; 06/22/2023= 630 feet with 4WW  Time  12   Period Weeks   Status ONGOING  Target Date 07/25/2023   PT LONG TERM GOAL #8  Title Pt will decrease 5TSTS by at least 2 seconds (without UE support)  in order to demonstrate clinically significant improvement in LE strength.   Baseline 08/11/2022= 16.33 sec with B hands on knees; 09/15/2022= 22 sec with B hands on knees (patient reports increased weakness since around Christmas and just has not bounced back); 11/08/2022= 18.85 without UE Support; 01/17/2023=30.0 sec without UE support- patient reported not having a good day but tried his best. 02/02/2023- 17.74 sec with UE Support; 03/09/2023 =15.82 sec without UE support; 04/25/2023= 15.77 sec;  06/21/2023= 17.09 sec without UE support.   Time 12   Period Weeks  Status ONGOING  Target Date 07/25/2023       PT LONG TERM GOAL #9  Title Pt will improve BERG by at least 3 points in order to demonstrate clinically significant improvement in balance.   Baseline 11/08/2022= 33/56; 01/17/2023= 45/56; 02/02/2023=45/56; 05/02/2023=45/56  Time 12   Period Weeks  Status ONGOING  Target Date 07/25/2023       Plan -     Clinical Impression Statement Pt was able to mostly tolerate tasks during today's visit. Pt was able to complete sit to stands without UE support and standing and standing hip abductions/extensions. Pt was limited in reps in some tasks due to fatigue. Patient will continue to benefit from skilled PT services to address deficits and impairment identified in evaluation in order to maximize independence and safety in basic mobility required for performance of ADL, IADL, and leisure.    Personal Factors and Comorbidities Age;Past/Current Experience;Time since onset of injury/illness/exacerbation;Comorbidity 3+    Comorbidities COPD, History of Cancer, myasthenia gravis, chronic lumbar surgical history    Examination-Activity Limitations Lift;Squat;Bend;Stairs;Stand;Transfers;Probation officer;Bathing;Hygiene/Grooming;Dressing;Toileting;Bed Mobility;Caring for Others;Continence    Examination-Participation Restrictions Yard Work;Church;Cleaning;Driving;Community Activity    Stability/Clinical Decision Making Evolving/Moderate complexity    Rehab Potential Good    Clinical Impairments Affecting Rehab Potential multipe comorbidities    PT Frequency 1-2x / week    PT Duration 12 weeks    PT Treatment/Interventions ADLs/Self Care Home Management;Electrical Stimulation;Moist Heat;Traction;Ultrasound;Gait training;Stair training;Functional mobility training;Therapeutic activities;Therapeutic exercise;Balance training;Neuromuscular re-education;Patient/family  education;Manual techniques;Passive range of motion;Dry needling;Joint Manipulations;Cryotherapy;Vestibular;Canalith Repostioning    PT Next Visit Plan Progress LE strengthening, balance training,  and functional endurance.   PT Home Exercise Plan standing strengthening and balance, seated strengthening and coordination.    Consulted and Agree with Plan of Care Patient            3:53 PM, 07/18/23  Debara Pickett, SPT  This entire session was performed under direct supervision and direction of a licensed therapist/therapist assistant . I have personally read, edited and approve of the note as written.   Lenda Kelp PT Physical Therapist - [facility]  Outpatient Physical Therapy- Main Campus [phone]     07/18/23, 3:53 PM

## 2023-07-20 ENCOUNTER — Ambulatory Visit: Payer: Medicare Other

## 2023-07-25 ENCOUNTER — Ambulatory Visit: Payer: Medicare Other

## 2023-07-25 DIAGNOSIS — M6281 Muscle weakness (generalized): Secondary | ICD-10-CM

## 2023-07-25 DIAGNOSIS — R269 Unspecified abnormalities of gait and mobility: Secondary | ICD-10-CM

## 2023-07-25 DIAGNOSIS — R2681 Unsteadiness on feet: Secondary | ICD-10-CM

## 2023-07-25 DIAGNOSIS — R262 Difficulty in walking, not elsewhere classified: Secondary | ICD-10-CM | POA: Diagnosis not present

## 2023-07-25 DIAGNOSIS — G8929 Other chronic pain: Secondary | ICD-10-CM

## 2023-07-25 DIAGNOSIS — R2689 Other abnormalities of gait and mobility: Secondary | ICD-10-CM

## 2023-07-25 DIAGNOSIS — R296 Repeated falls: Secondary | ICD-10-CM

## 2023-07-25 NOTE — Therapy (Signed)
OUTPATIENT PHYSICAL THERAPY TREATMENT/RECERT   Patient Name: Francisco [name] MRN: [id number] DOB:Nov 10, 1945, 77 y.o., male Today's Date: 07/25/2023  PCP: Lynnea Ferrier, MD REFERRING PROVIDER: Venetia Night MD   PT End of Session - 07/25/23 1418     Visit Number 85    Number of Visits 109    Date for PT Re-Evaluation 10/17/23    Authorization Type [employer] Medicare    Authorization Time Period 02/02/23-04/27/23    Progress Note Due on Visit 90    PT Start Time 1407    PT Stop Time 1445    PT Time Calculation (min) 38 min    Equipment Utilized During Treatment Gait belt    Activity Tolerance Patient tolerated treatment well;Patient limited by fatigue;No increased pain    Behavior During Therapy WFL for tasks assessed/performed                         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   Carotid stenosis    CHF (congestive heart failure) (HCC)    CKD (chronic kidney disease)    CKD (chronic kidney disease) stage 3, GFR 30-59 ml/min (HCC)    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   DDD (degenerative disc disease), cervical    Dysrhythmia    post carotid stent bradycardia; PAF 09/2020   GERD (gastroesophageal reflux disease)    Hypercholesterolemia    Hypertension    Hypothyroidism    pt takes Levothyroxine daily   Lumbosacral spinal stenosis    Myasthenia gravis, adult form (HCC)    PAD (peripheral artery disease) (HCC)    Parkinson disease (HCC)    Shortness of breath    Lung MD- Dr Joetta Manners   Sleep apnea    do not use CPAP every night   Stroke (HCC) 05/31/2023   Past Surgical History:  Procedure Laterality Date   ANTERIOR CERVICAL  DECOMP/DISCECTOMY FUSION  07/18/2011   Procedure: ANTERIOR CERVICAL DECOMPRESSION/DISCECTOMY FUSION 2 LEVELS;  Surgeon: Kathaleen Maser Pool;  Location: [facility];  Service: Neurosurgery;  Laterality: N/A;  cervical five-six, cervical six-seven anterior cervical discectomy and fusion   ATRIAL FIBRILLATION ABLATION     BACK SURGERY     in 1985 [facility]   BILATERAL CARPAL TUNNEL RELEASE     01/2020 Right, 04/2020 Left   CARDIAC CATHETERIZATION     2005 at [facility], no stents   CAROTID PTA/STENT INTERVENTION N/A 09/17/2020   Procedure: CAROTID PTA/STENT INTERVENTION;  Surgeon: Annice Needy, MD;  Location: [facility];  Service: Cardiovascular;  Laterality: N/A;   CATARACT EXTRACTION W/PHACO Left 01/06/2020   Procedure: CATARACT EXTRACTION PHACO AND INTRAOCULAR LENS PLACEMENT (IOC) ISTENT INJ LEFT 3.81  00:33.3;  Surgeon: Nevada Crane, MD;  Location: [facility];  Service: Ophthalmology;  Laterality: Left;   CATARACT EXTRACTION W/PHACO Right 02/03/2020   Procedure: CATARACT EXTRACTION PHACO AND INTRAOCULAR LENS PLACEMENT (IOC) RIGHT ISTENT INJ;  Surgeon: Nevada Crane, MD;  Location: [facility];  Service: Ophthalmology;  Laterality: Right;  4.29 0:35.6   COLONOSCOPY  HERNIA REPAIR Left    inguinal hernia repair in 1985   LUMBAR LAMINECTOMY/DECOMPRESSION MICRODISCECTOMY Left 02/24/2014   Procedure: LUMBAR LAMINECTOMY/DECOMPRESSION MICRODISCECTOMY LUMBAR THREE-FOUR, FOUR-FIVE, LEFT FIVE-SACRAL ONE ;  Surgeon: Temple Pacini, MD;  Location: [facility];  Service: Neurosurgery;  Laterality: Left;  LUMBAR LAMINECTOMY/DECOMPRESSION MICRODISCECTOMY LUMBAR THREE-FOUR, FOUR-FIVE, LEFT FIVE-SACRAL ONE    LUMBAR LAMINECTOMY/DECOMPRESSION MICRODISCECTOMY N/A 05/03/2021   Procedure: Laminectomy and Foraminotomy - L2-L3;  Surgeon: Julio Sicks, MD;  Location: [facility];  Service: Neurosurgery;  Laterality: N/A;  3C   POSTERIOR CERVICAL FUSION/FORAMINOTOMY N/A 08/07/2020   Procedure:  C3-6 POSTERIOR FUSION WITH DECOMPRESSION;  Surgeon: Venetia Night, MD;  Location: [facility] OR[facility];  Service: Neurosurgery;  Laterality: N/A;   PROSTATECTOMY  04/2013   [facility] Dr Joycelyn Das    Patient Active Problem List   Diagnosis Date Noted   Stroke [facility]) 05/31/2023   Obesity (BMI 30-39.9) 05/31/2023   HLD (hyperlipidemia) 05/31/2023   Chronic diastolic CHF (congestive heart failure) (HCC) 05/31/2023   Hyperkalemia 05/31/2023   SVT (supraventricular tachycardia) (HCC) 12/30/2021   Hypothyroidism 12/30/2021   Parkinson's disease (HCC) 12/30/2021   Elevated troponin 12/30/2021   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   Acquired thrombophilia (HCC) 01/13/2021   History of decompression of median nerve 01/01/2021   Paroxysmal atrial fibrillation (HCC) 10/06/2020   Carotid stenosis, right 09/17/2020   S/P cervical spinal fusion    Leukocytosis    Essential hypertension    Anemia of chronic disease    Postoperative pain    Neuropathic pain    Cervical myelopathy (HCC) 08/07/2020   Preop cardiovascular exam 07/17/2020   SOB (shortness of breath) on exertion 07/17/2020   Leg weakness, bilateral 07/13/2020   Aortic atherosclerosis (HCC) 07/09/2020   Body mass index (BMI) 34.0-34.9, adult 12/25/2019   Myalgia 10/30/2019   Lumbar post-laminectomy syndrome 10/24/2019   PAD (peripheral artery disease) (HCC) 06/06/2019   CKD (chronic kidney disease) stage 3, GFR 30-59 ml/min (HCC) 04/29/2019   B12 deficiency 01/23/2019   Left arm numbness 02/28/2018   Neck pain 02/28/2018   Anemia 02/02/2017   DDD (degenerative disc disease), cervical 02/02/2017   Hypothyroid 02/02/2017   MRSA (methicillin resistant staph aureus) culture positive 02/02/2017   Nocturnal hypoxia 02/02/2017   Senile purpura (HCC) 10/03/2016   Essential hypertension, benign 09/16/2016   Bilateral carotid artery disease (HCC) 09/16/2016   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/18/2016   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   History of prostate cancer 12/21/2015   Left carpal tunnel syndrome 11/11/2015   Kidney stone on left side 07/05/2015   Myasthenia gravis (HCC) 05/26/2015   Radiculitis 01/05/2015   Long-term use of high-risk medication 10/15/2014   Persistent cough 09/10/2014   Pure hypercholesterolemia 07/25/2014   Spinal stenosis, lumbar region, with neurogenic claudication 02/24/2014   Lumbosacral stenosis with neurogenic claudication 02/24/2014   COPD (chronic obstructive pulmonary disease) (HCC) 01/22/2014    REFERRING DIAG: balance disorder   THERAPY DIAG:  Difficulty in walking, not elsewhere classified  Abnormality of gait and mobility  Chronic left-sided low back pain with left-sided sciatica  Muscle weakness (generalized)  Repeated falls  Unsteadiness on feet  Other abnormalities of gait and mobility  Rationale for Evaluation and Treatment Rehabilitation  PERTINENT HISTORY: Lain Haniff is a 77 year old male referred to [facility] neuro for difficulty with balance. He was also just recently dx in May 2023 with Impingement syndrome of left shoulder (M75.42) Impingement syndrome of left shoulder (primary encounter diagnosis)  Left shoulder pain, unspecified chronicity Nontraumatic rupture of left proximal biceps tendon Past medical hx includes Lumbar laminectomy 04/2021. C3-6 decompression, fusion on 11/26, myasthenia gravis, 2012 ACDF 5/6, 6/7; OSA, hypothyroidism, HTN, COPD, multiple back surgeries, 2021 carpal tunnel release.  PRECAUTIONS: Fall  SUBJECTIVE: Pt reports no pain today and states Legs feel fairly strong but states having a lot going on in his personal life that has him stressed. States eager to continue strengthening as he knows his progress had slowed down but feeling some better overall.   PAIN:  Are you having pain? no   TODAY'S TREATMENT:    07/25/23   TA:  Physical therapy treatment session today consisted of completing assessment  of goals and administration of testing as demonstrated and documented in flow sheet, treatment, and goals section of this note. Addition treatments may be found below.   5x STS= 16.35 sec  avg with 2 trials without UE support TUG= 22.9 sec avg with 2 trials with SBQC 6 min walk= 460 feet in 4 min 17 sec- Stopped secondary to increased fatigue.       Pt educated throughout session about proper posture and technique with exercises. Improved exercise technique, movement at target joints, use of target muscles after min to mod verbal, visual, tactile cues.   PATIENT EDUCATION: Education details: Goals, plan, exercise technique, body mechanics, energy conservation techniques  Person educated: Patient Education method: Explanation, Demonstration, Tactile cues, Verbal cues, and Handouts Education comprehension: verbalized understanding, returned demonstration, verbal cues required, and tactile cues required   HOME EXERCISE PROGRAM:  No Updates today  Access Code: [email or web address] URL: [email or web address] Date: 03/01/2022 Prepared by: Precious Bard  Exercises - Seated March  - 1 x daily - 7 x weekly - 2 sets - 10 reps - 5 hold - Seated Long Arc Quad  - 1 x daily - 7 x weekly - 2 sets - 10 reps - 5 hold - Seated Hip Abduction  - 1 x daily - 7 x weekly - 2 sets - 10 reps - 5 hold - Seated Hip Adduction Isometrics with Ball  - 1 x daily - 7 x weekly - 2 sets - 10 reps - 5 hold - Seated Gluteal Sets  - 1 x daily - 7 x weekly - 2 sets - 10 reps - 5 hold - Seated Heel Toe Raises  - 1 x daily - 7 x weekly - 2 sets - 10 reps - 5 hold LE strength and balance      PT Short Term Goals       PT SHORT TERM GOAL #1   Title Pt will be independent with initial HEP in order to improve strength and balance in order to decrease fall risk and improve function at home and work.    Baseline 02/23/2022- Patient reports not doing much as far as exercise in the home currently. 04/20/2022- Patient  reports compliant with HEP and no questions at this time.; 9/7 pt indep   Time 6    Period Weeks    Status Goal met   Target Date 04/06/22              PT Long Term Goals       PT LONG TERM GOAL #1   Title Pt will be independent with final HEP in order to improve strength and balance in order to decrease fall risk and improve function at home and work. 04/20/2022- Patient reports compliant with HEP and no questions at this time.  Baseline 02/23/2022= No formal HEP in place. 9/7: pt performing at least once a day, feels comfortable   Time 12    Period Weeks    Status GOAL MET   Target Date 08/11/2022      PT LONG TERM GOAL #2   Title Pt will decrease 5TSTS by at least 8 seconds in order to demonstrate clinically significant improvement in LE strength.    Baseline 02/23/2022= 31.52 sec with B hands on knees; 04/20/2022= 16.08 sec with B hands on knees   Time 12    Period Days    Status Goal Met   Target Date 05/18/22      PT LONG TERM GOAL #3   Title Pt will increase by at least 0.23 m/s in order to demonstrate clinically significant improvement in community ambulation.    Baseline 02/23/2022= 0.39 m/s using RW; 0.45 m/s; 9/7: 0.37 m/s with QC, 0.74 m/s with RW   Time 12    Period Weeks    Status GOAL MET   Target Date 05/18/22      PT LONG TERM GOAL #4   Title Pt will improve FOTO to target score of 50 to display perceived improvements in ability to complete ADL's.    Baseline 02/23/2022= 47; 9/7: 49; 11/30=54%   Time 12    Period Weeks    Status GOAL MET   Target Date 08/11/2022      PT LONG TERM GOAL #5   Title Pt will decrease TUG to below 20 seconds/decrease in order to demonstrate decreased fall risk.    Baseline 02/23/2022=28.27 sec with RW; 04/20/2022= 20.22 with SBQC; 9/7: 14.4 sec with RW, 18.5 sec with SBQC   Time 12    Period Weeks    Status MET   Target Date 05/18/22    PT LONG TERM GOAL #6  Title Pt will decrease TUG to 14 sec or below with SBQC in  order to demonstrate decreased fall risk.   Baseline  9/7: 14.4 sec with RW, 18.5 sec with SBQC; 11/30=16.85 sec without an AD. 09/15/2022= 22.14 sec with SPC; 11/08/2022= 25.17 sec without and AD and 21.22 sec with SPC. 01/31/2023= 22.64 sec without a AD and 24.64 sec with SBQC;  03/09/2023= 22.04 sec with SBQC and 14.88 sec with 4WW.  05/02/2023= 20.46 sec with SBQC and 14.22 sec with 4WW; 06/21/2023= 16.47 sec with SBQC. 07/25/2023= 22.9 sec with SBQC  Time 12   Period Weeks   Status ONGOING  Target Date 10/17/2023   PT LONG TERM GOAL #7  Title Patient will increase six minute walk test distance to >700 ft for improved gait ability and increased ease with community participation.  Baseline 9/7: completes 4 minutes and 444 ft with RW. Test discontinued at 4 minutes due to fatgiue. 08/11/2022= patient ambulated 200 feet yet required 1 seated rest break- due to BLE fatigue- using SBQC: 09/15/2022= Patient ambulated 300 feet in 4 min 20 sec with 1 seated rest break using SBQC. 11/08/2022 = 175 feet- stopped due to exhausted today- 2 min 10 sec; 01/17/2023= 200 feet in 2 min 38 sec; 02/02/2023= 160 feet with use of SBQC in 3 min 45 sec prior to stopping. 6/27 430 feet in 40 sec; 04/25/2023= 6 min complete - 500 feet using 4WW; 06/22/2023= 630 feet with 4WW; 07/25/2023= 460 feet in 4 min 17 sec.   Time 12   Period Weeks   Status ONGOING  Target Date 07/25/2023   PT LONG TERM GOAL #  8  Title Pt will decrease 5TSTS by at least 2 seconds (without UE support)  in order to demonstrate clinically significant improvement in LE strength.   Baseline 08/11/2022= 16.33 sec with B hands on knees; 09/15/2022= 22 sec with B hands on knees (patient reports increased weakness since around Christmas and just has not bounced back); 11/08/2022= 18.85 without UE Support; 01/17/2023=30.0 sec without UE support- patient reported not having a good day but tried his best. 02/02/2023- 17.74 sec with UE Support; 03/09/2023 =15.82 sec without UE  support; 04/25/2023= 15.77 sec; 06/21/2023= 17.09 sec without UE support. 07/25/2023= 16.35 sec without UE support  Time 12   Period Weeks  Status ONGOING  Target Date 10/17/2023        PT LONG TERM GOAL #9  Title Pt will improve BERG by at least 3 points in order to demonstrate clinically significant improvement in balance.   Baseline 11/08/2022= 33/56; 01/17/2023= 45/56; 02/02/2023=45/56; 05/02/2023=45/56;   Time 12   Period Weeks  Status ONGOING  Target Date 07/25/2023       Plan -     Clinical Impression Statement Pt exhibited mix results from testing today. He was able to demo some functional strength progress as seen by improved time with 5 time sit to stand without UE support. He later demonstrated slower speed with TUG and decreased overall distance/stamina with 6 min walk test. His progress over this cert has been limited by personal issues as his wife now dealing with serious medical condition and he has been overwhelmed at home trying to make sure she is okay. Will continue with with plan to recert but if no progress made over this period will need to plan for discharge. Patient's condition has the potential to improve in response to therapy. Maximum improvement is yet to be obtained. The anticipated improvement is attainable and reasonable in a generally predictable time.  Patient reports  .  Patient will continue to benefit from skilled PT services to address deficits and impairment identified in evaluation in order to maximize independence and safety in basic mobility required for performance of ADL, IADL, and leisure.    Personal Factors and Comorbidities Age;Past/Current Experience;Time since onset of injury/illness/exacerbation;Comorbidity 3+    Comorbidities COPD, History of Cancer, myasthenia gravis, chronic lumbar surgical history    Examination-Activity Limitations Lift;Squat;Bend;Stairs;Stand;Transfers;Environmental consultant;Bathing;Hygiene/Grooming;Dressing;Toileting;Bed Mobility;Caring for Others;Continence    Examination-Participation Restrictions Yard Work;Church;Cleaning;Driving;Community Activity    Stability/Clinical Decision Making Evolving/Moderate complexity    Rehab Potential Good    Clinical Impairments Affecting Rehab Potential multipe comorbidities    PT Frequency 1-2x / week    PT Duration 12 weeks    PT Treatment/Interventions ADLs/Self Care Home Management;Electrical Stimulation;Moist Heat;Traction;Ultrasound;Gait training;Stair training;Functional mobility training;Therapeutic activities;Therapeutic exercise;Balance training;Neuromuscular re-education;Patient/family education;Manual techniques;Passive range of motion;Dry needling;Joint Manipulations;Cryotherapy;Vestibular;Canalith Repostioning    PT Next Visit Plan Progress LE strengthening, balance training,  and functional endurance.   PT Home Exercise Plan standing strengthening and balance, seated strengthening and coordination.    Consulted and Agree with Plan of Care Patient            3:46 PM, 07/25/23     Lenda Kelp PT Physical Therapist - [facility]  Outpatient Physical Therapy- [facility] [phone]     07/25/23, 3:46 PM

## 2023-07-27 ENCOUNTER — Telehealth: Payer: Self-pay

## 2023-07-27 ENCOUNTER — Ambulatory Visit: Payer: Medicare Other

## 2023-07-27 NOTE — Telephone Encounter (Signed)
Patient Name: Francisco [name] MRN: [id number] DOB:[birth year], 77 y.o., male Today's Date: 07/27/2023  Phone call to patient as he did not show for today's visit. He answered phone stating he was very sorry but dealing with issues with his wife and her health. Reports she is now scheduled to have surgery tomorrow and will be in hospital for several days. He requested to cancel next Tues visit on 11/19 and will return on 08/03/2023 at 2pm scheduled time.    Lenda Kelp, PT 07/27/2023, 2:44 PM

## 2023-07-27 NOTE — Therapy (Deleted)
OUTPATIENT PHYSICAL THERAPY TREATMENT   Patient Name: Francisco [name] MRN: [id number] DOB:11-Sep-1946, 77 y.o., male Today's Date: 07/27/2023  PCP: Lynnea Ferrier, MD REFERRING PROVIDER: Venetia Night MD     Patient Name: Francisco [name] MRN: [id number] DOB:03/18/1946, 77 y.o., male Today's Date: 07/27/2023  Phone call to patient as he did not show for today's visit. He answered phone stating he was very sorry but dealing with issues with his wife and her health. Reports she is now scheduled to have surgery tomorrow and will be in hospital for several days. He requested to cancel next Tues visit on 11/19 and will return on 08/03/2023 at 2pm scheduled time.    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   Carotid stenosis    CHF (congestive heart failure) (HCC)    CKD (chronic kidney disease)    CKD (chronic kidney disease) stage 3, GFR 30-59 ml/min (HCC)    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   DDD (degenerative disc disease), cervical    Dysrhythmia    post carotid stent bradycardia; PAF 09/2020   GERD (gastroesophageal reflux disease)    Hypercholesterolemia    Hypertension    Hypothyroidism    pt takes Levothyroxine daily   Lumbosacral spinal stenosis    Myasthenia gravis, adult form (HCC)    PAD (peripheral artery disease) (HCC)    Parkinson disease (HCC)    Shortness of breath    Lung MD- Dr Joetta Manners   Sleep apnea    do not use CPAP every night   Stroke (HCC) 05/31/2023   Past Surgical History:  Procedure Laterality Date   ANTERIOR CERVICAL DECOMP/DISCECTOMY FUSION  07/18/2011    Procedure: ANTERIOR CERVICAL DECOMPRESSION/DISCECTOMY FUSION 2 LEVELS;  Surgeon: Kathaleen Maser Pool;  Location: [facility];  Service: Neurosurgery;  Laterality: N/A;  cervical five-six, cervical six-seven anterior cervical discectomy and fusion   ATRIAL FIBRILLATION ABLATION     BACK SURGERY     in 1985 [facility]   BILATERAL CARPAL TUNNEL RELEASE     01/2020 Right, 04/2020 Left   CARDIAC CATHETERIZATION     2005 at [facility], no stents   CAROTID PTA/STENT INTERVENTION N/A 09/17/2020   Procedure: CAROTID PTA/STENT INTERVENTION;  Surgeon: Annice Needy, MD;  Location: [facility];  Service: Cardiovascular;  Laterality: N/A;   CATARACT EXTRACTION W/PHACO Left 01/06/2020   Procedure: CATARACT EXTRACTION PHACO AND INTRAOCULAR LENS PLACEMENT (IOC) ISTENT INJ LEFT 3.81  00:33.3;  Surgeon: Nevada Crane, MD;  Location: [facility];  Service: Ophthalmology;  Laterality: Left;   CATARACT EXTRACTION W/PHACO Right 02/03/2020   Procedure: CATARACT EXTRACTION PHACO AND INTRAOCULAR LENS PLACEMENT (IOC) RIGHT ISTENT INJ;  Surgeon: Nevada Crane, MD;  Location: [facility];  Service: Ophthalmology;  Laterality: Right;  4.29 0:35.6   COLONOSCOPY     HERNIA REPAIR Left    inguinal hernia repair in 1985   LUMBAR LAMINECTOMY/DECOMPRESSION MICRODISCECTOMY Left 02/24/2014   Procedure: LUMBAR LAMINECTOMY/DECOMPRESSION MICRODISCECTOMY LUMBAR THREE-FOUR, FOUR-FIVE,  LEFT FIVE-SACRAL ONE ;  Surgeon: Temple Pacini, MD;  Location: [facility];  Service: Neurosurgery;  Laterality: Left;  LUMBAR LAMINECTOMY/DECOMPRESSION MICRODISCECTOMY LUMBAR THREE-FOUR, FOUR-FIVE, LEFT FIVE-SACRAL ONE    LUMBAR LAMINECTOMY/DECOMPRESSION MICRODISCECTOMY N/A 05/03/2021   Procedure: Laminectomy and Foraminotomy - L2-L3;  Surgeon: Julio Sicks, MD;  Location: [facility];  Service: Neurosurgery;  Laterality: N/A;  3C   POSTERIOR CERVICAL FUSION/FORAMINOTOMY N/A 08/07/2020   Procedure: C3-6 POSTERIOR FUSION WITH  DECOMPRESSION;  Surgeon: Venetia Night, MD;  Location: [facility];  Service: Neurosurgery;  Laterality: N/A;   PROSTATECTOMY  04/2013   [facility] Dr Joycelyn Das    Patient Active Problem List   Diagnosis Date Noted   Stroke [facility]) 05/31/2023   Obesity (BMI 30-39.9) 05/31/2023   HLD (hyperlipidemia) 05/31/2023   Chronic diastolic CHF (congestive heart failure) (HCC) 05/31/2023   Hyperkalemia 05/31/2023   SVT (supraventricular tachycardia) (HCC) 12/30/2021   Hypothyroidism 12/30/2021   Parkinson's disease (HCC) 12/30/2021   Elevated troponin 12/30/2021   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   Acquired thrombophilia (HCC) 01/13/2021   History of decompression of median nerve 01/01/2021   Paroxysmal atrial fibrillation (HCC) 10/06/2020   Carotid stenosis, right 09/17/2020   S/P cervical spinal fusion    Leukocytosis    Essential hypertension    Anemia of chronic disease    Postoperative pain    Neuropathic pain    Cervical myelopathy (HCC) 08/07/2020   Preop cardiovascular exam 07/17/2020   SOB (shortness of breath) on exertion 07/17/2020   Leg weakness, bilateral 07/13/2020   Aortic atherosclerosis (HCC) 07/09/2020   Body mass index (BMI) 34.0-34.9, adult 12/25/2019   Myalgia 10/30/2019   Lumbar post-laminectomy syndrome 10/24/2019   PAD (peripheral artery disease) (HCC) 06/06/2019   CKD (chronic kidney disease) stage 3, GFR 30-59 ml/min (HCC) 04/29/2019   B12 deficiency 01/23/2019   Left arm numbness 02/28/2018   Neck pain 02/28/2018   Anemia 02/02/2017   DDD (degenerative disc disease), cervical 02/02/2017   Hypothyroid 02/02/2017   MRSA (methicillin resistant staph aureus) culture positive 02/02/2017   Nocturnal hypoxia 02/02/2017   Senile purpura (HCC) 10/03/2016   Essential hypertension, benign 09/16/2016   Bilateral carotid artery disease (HCC) 09/16/2016   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/18/2016   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  Rationale for Evaluation and Treatment Rehabilitation  PERTINENT HISTORY: Leo Inzunza is a 77 year old male referred to [facility] neuro for difficulty with balance. He was also just recently dx in May 2023 with Impingement syndrome of left shoulder (M75.42) Impingement syndrome of left shoulder (primary encounter diagnosis) Left shoulder pain, unspecified chronicity Nontraumatic rupture of left proximal biceps tendon Past medical hx includes Lumbar laminectomy 04/2021. C3-6 decompression, fusion on 11/26, myasthenia gravis, 2012 ACDF 5/6, 6/7; OSA, hypothyroidism, HTN, COPD, multiple back surgeries, 2021 carpal tunnel release.  PRECAUTIONS: Fall  SUBJECTIVE: Pt reports no pain today and states Legs feel fairly strong but states having a lot going  on in his personal life that has him stressed. States eager to continue strengthening as he knows his progress had slowed down but feeling some better overall.   PAIN:  Are you having pain? no   TODAY'S TREATMENT:    07/27/23    5x STS= 16.35 sec  avg with 2 trials without UE support TUG= 22.9 sec avg with 2 trials with SBQC 6 min walk= 460 feet in 4 min 17 sec- Stopped secondary to increased fatigue.       Pt educated throughout session about proper posture and technique with exercises. Improved exercise technique, movement at target joints, use of target muscles after  min to mod verbal, visual, tactile cues.   PATIENT EDUCATION: Education details: Goals, plan, exercise technique, body mechanics, energy conservation techniques  Person educated: Patient Education method: Explanation, Demonstration, Tactile cues, Verbal cues, and Handouts Education comprehension: verbalized understanding, returned demonstration, verbal cues required, and tactile cues required   HOME EXERCISE PROGRAM:  No Updates today  Access Code: [email or web address] URL: [email or web address] Date: 03/01/2022 Prepared by: Precious Bard  Exercises - Seated March  - 1 x daily - 7 x weekly - 2 sets - 10 reps - 5 hold - Seated Long Arc Quad  - 1 x daily - 7 x weekly - 2 sets - 10 reps - 5 hold - Seated Hip Abduction  - 1 x daily - 7 x weekly - 2 sets - 10 reps - 5 hold - Seated Hip Adduction Isometrics with Ball  - 1 x daily - 7 x weekly - 2 sets - 10 reps - 5 hold - Seated Gluteal Sets  - 1 x daily - 7 x weekly - 2 sets - 10 reps - 5 hold - Seated Heel Toe Raises  - 1 x daily - 7 x weekly - 2 sets - 10 reps - 5 hold LE strength and balance      PT Short Term Goals       PT SHORT TERM GOAL #1   Title Pt will be independent with initial HEP in order to improve strength and balance in order to decrease fall risk and improve function at home and work.    Baseline 02/23/2022- Patient reports not doing much as far as exercise in the home currently. 04/20/2022- Patient reports compliant with HEP and no questions at this time.; 9/7 pt indep   Time 6    Period Weeks    Status Goal met   Target Date 04/06/22              PT Long Term Goals       PT LONG TERM GOAL #1   Title Pt will be independent with final HEP in order to improve strength and balance in order to decrease fall risk and improve function at home and work. 04/20/2022- Patient reports compliant with HEP and no questions at this time.    Baseline 02/23/2022= No formal HEP in place. 9/7: pt performing at least once a  day, feels comfortable   Time 12    Period Weeks    Status GOAL MET   Target Date 08/11/2022      PT LONG TERM GOAL #2   Title Pt will decrease 5TSTS by at least 8 seconds in order to demonstrate clinically significant improvement in LE strength.    Baseline 02/23/2022= 31.52 sec with B hands on knees; 04/20/2022= 16.08 sec with B hands on knees   Time 12    Period Days  Status Goal Met   Target Date 05/18/22      PT LONG TERM GOAL #3   Title Pt will increase by at least 0.23 m/s in order to demonstrate clinically significant improvement in community ambulation.    Baseline 02/23/2022= 0.39 m/s using RW; 0.45 m/s; 9/7: 0.37 m/s with QC, 0.74 m/s with RW   Time 12    Period Weeks    Status GOAL MET   Target Date 05/18/22      PT LONG TERM GOAL #4   Title Pt will improve FOTO to target score of 50 to display perceived improvements in ability to complete ADL's.    Baseline 02/23/2022= 47; 9/7: 49; 11/30=54%   Time 12    Period Weeks    Status GOAL MET   Target Date 08/11/2022      PT LONG TERM GOAL #5   Title Pt will decrease TUG to below 20 seconds/decrease in order to demonstrate decreased fall risk.    Baseline 02/23/2022=28.27 sec with RW; 04/20/2022= 20.22 with SBQC; 9/7: 14.4 sec with RW, 18.5 sec with SBQC   Time 12    Period Weeks    Status MET   Target Date 05/18/22    PT LONG TERM GOAL #6  Title Pt will decrease TUG to 14 sec or below with SBQC in order to demonstrate decreased fall risk.   Baseline  9/7: 14.4 sec with RW, 18.5 sec with SBQC; 11/30=16.85 sec without an AD. 09/15/2022= 22.14 sec with SPC; 11/08/2022= 25.17 sec without and AD and 21.22 sec with SPC. 01/31/2023= 22.64 sec without a AD and 24.64 sec with SBQC;  03/09/2023= 22.04 sec with SBQC and 14.88 sec with 4WW.  05/02/2023= 20.46 sec with SBQC and 14.22 sec with 4WW; 06/21/2023= 16.47 sec with SBQC. 07/25/2023= 22.9 sec with SBQC  Time 12   Period Weeks   Status ONGOING  Target Date 10/17/2023   PT LONG  TERM GOAL #7  Title Patient will increase six minute walk test distance to >700 ft for improved gait ability and increased ease with community participation.  Baseline 9/7: completes 4 minutes and 444 ft with RW. Test discontinued at 4 minutes due to fatgiue. 08/11/2022= patient ambulated 200 feet yet required 1 seated rest break- due to BLE fatigue- using SBQC: 09/15/2022= Patient ambulated 300 feet in 4 min 20 sec with 1 seated rest break using SBQC. 11/08/2022 = 175 feet- stopped due to exhausted today- 2 min 10 sec; 01/17/2023= 200 feet in 2 min 38 sec; 02/02/2023= 160 feet with use of SBQC in 3 min 45 sec prior to stopping. 6/27 430 feet in 40 sec; 04/25/2023= 6 min complete - 500 feet using 4WW; 06/22/2023= 630 feet with 4WW; 07/25/2023= 460 feet in 4 min 17 sec.   Time 12   Period Weeks   Status ONGOING  Target Date 07/25/2023   PT LONG TERM GOAL #8  Title Pt will decrease 5TSTS by at least 2 seconds (without UE support)  in order to demonstrate clinically significant improvement in LE strength.   Baseline 08/11/2022= 16.33 sec with B hands on knees; 09/15/2022= 22 sec with B hands on knees (patient reports increased weakness since around Christmas and just has not bounced back); 11/08/2022= 18.85 without UE Support; 01/17/2023=30.0 sec without UE support- patient reported not having a good day but tried his best. 02/02/2023- 17.74 sec with UE Support; 03/09/2023 =15.82 sec without UE support; 04/25/2023= 15.77 sec; 06/21/2023= 17.09 sec without UE support. 07/25/2023=  16.35 sec without UE support  Time 12   Period Weeks  Status ONGOING  Target Date 10/17/2023        PT LONG TERM GOAL #9  Title Pt will improve BERG by at least 3 points in order to demonstrate clinically significant improvement in balance.   Baseline 11/08/2022= 33/56; 01/17/2023= 45/56; 02/02/2023=45/56; 05/02/2023=45/56;   Time 12   Period Weeks  Status ONGOING  Target Date 07/25/2023       Plan -     Clinical Impression  Statement *** Patient will continue to benefit from skilled PT services to address deficits and impairment identified in evaluation in order to maximize independence and safety in basic mobility required for performance of ADL, IADL, and leisure.    Personal Factors and Comorbidities Age;Past/Current Experience;Time since onset of injury/illness/exacerbation;Comorbidity 3+    Comorbidities COPD, History of Cancer, myasthenia gravis, chronic lumbar surgical history    Examination-Activity Limitations Lift;Squat;Bend;Stairs;Stand;Transfers;Probation officer;Bathing;Hygiene/Grooming;Dressing;Toileting;Bed Mobility;Caring for Others;Continence    Examination-Participation Restrictions Yard Work;Church;Cleaning;Driving;Community Activity    Stability/Clinical Decision Making Evolving/Moderate complexity    Rehab Potential Good    Clinical Impairments Affecting Rehab Potential multipe comorbidities    PT Frequency 1-2x / week    PT Duration 12 weeks    PT Treatment/Interventions ADLs/Self Care Home Management;Electrical Stimulation;Moist Heat;Traction;Ultrasound;Gait training;Stair training;Functional mobility training;Therapeutic activities;Therapeutic exercise;Balance training;Neuromuscular re-education;Patient/family education;Manual techniques;Passive range of motion;Dry needling;Joint Manipulations;Cryotherapy;Vestibular;Canalith Repostioning    PT Next Visit Plan Progress LE strengthening, balance training,  and functional endurance.   PT Home Exercise Plan standing strengthening and balance, seated strengthening and coordination.    Consulted and Agree with Plan of Care Patient            9:43 AM, 07/27/23     Lenda Kelp PT Physical Therapist - [facility]  Outpatient Physical Therapy- Main Campus [phone]     07/27/23, 9:43 AM

## 2023-08-01 ENCOUNTER — Ambulatory Visit: Payer: Medicare Other

## 2023-08-01 ENCOUNTER — Ambulatory Visit (INDEPENDENT_AMBULATORY_CARE_PROVIDER_SITE_OTHER): Payer: Medicare Other | Admitting: Vascular Surgery

## 2023-08-01 ENCOUNTER — Encounter (INDEPENDENT_AMBULATORY_CARE_PROVIDER_SITE_OTHER): Payer: Self-pay | Admitting: Vascular Surgery

## 2023-08-01 VITALS — BP 105/64 | HR 65 | Resp 18 | Ht 70.0 in | Wt 220.0 lb

## 2023-08-01 DIAGNOSIS — I6523 Occlusion and stenosis of bilateral carotid arteries: Secondary | ICD-10-CM

## 2023-08-01 DIAGNOSIS — I10 Essential (primary) hypertension: Secondary | ICD-10-CM | POA: Diagnosis not present

## 2023-08-01 DIAGNOSIS — E7800 Pure hypercholesterolemia, unspecified: Secondary | ICD-10-CM | POA: Diagnosis not present

## 2023-08-01 DIAGNOSIS — J439 Emphysema, unspecified: Secondary | ICD-10-CM | POA: Diagnosis not present

## 2023-08-01 NOTE — Progress Notes (Signed)
MRN : [id number]  Francisco [name] is a 77 y.o. (19-Jan-1946) male who presents with chief complaint of  Chief Complaint  Patient presents with   Follow-up    1 year carotid follow up   .  History of Present Illness: Patient returns in follow-up of his carotid disease.  We are almost 3 years status post right carotid stent placement for high-grade stenosis.  He also had significant myelopathy and has undergone neurosurgical intervention in that same interim.  He is doing well today.  He has had no focal neurologic symptoms.  He recently received the bad news about his wife who was diagnosed with a malignant brain tumor.  He seems to be handling this much better than most.  His carotid duplex shows a widely patent right carotid stent with stable 40 to 59% left ICA stenosis.  Current Outpatient Medications  Medication Sig Dispense Refill   apixaban (ELIQUIS) 5 MG TABS tablet Take 5 mg by mouth 2 (two) times daily.     atorvastatin (LIPITOR) 10 MG tablet Take 1 tablet by mouth once daily 30 tablet 0   azaTHIOprine (IMURAN) 50 MG tablet Take 150 mg by mouth daily.      carbidopa-levodopa (SINEMET IR) 25-100 MG tablet Take 1 tablet by mouth 3 (three) times daily.     Cholecalciferol (D3-1000) 25 MCG (1000 UT) capsule Take 1,000 Units by mouth daily.     clopidogrel (PLAVIX) 75 MG tablet Take 1 tablet by mouth once daily 60 tablet 0   HYDROcodone-acetaminophen (NORCO) 10-325 MG tablet Take 1-2 tablets by mouth See admin instructions. Take 1 to 2 tablets every morning, may take 1 tablet every 6 hours as needed for pain     levothyroxine (SYNTHROID) 50 MCG tablet Take 1 tablet (50 mcg total) by mouth daily at 6 (six) AM. (Patient taking differently: Take 50 mcg by mouth daily at 6 (six) AM.) 30 tablet 0   meclizine (ANTIVERT) 25 MG tablet Take 1 tablet by mouth 3 (three) times daily as needed.     metoprolol tartrate (LOPRESSOR) 25 MG tablet Take 12.5 mg by mouth 2 (two) times daily.      predniSONE (DELTASONE) 10 MG tablet Take 10 mg by mouth every other day. Taking 10 mg Monday, Wed., and Friday     pregabalin (LYRICA) 50 MG capsule Take 50 mg by mouth 2 (two) times daily.     torsemide (DEMADEX) 20 MG tablet Take 20 mg by mouth daily.     traZODone (DESYREL) 50 MG tablet Take 50 mg by mouth at bedtime as needed.     spironolactone (ALDACTONE) 25 MG tablet Take 25 mg by mouth daily. (Patient not taking: Reported on 08/01/2023)     No current facility-administered medications for this visit.    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   Carotid stenosis    CHF (congestive heart failure) (HCC)    CKD (chronic kidney disease)    CKD (chronic kidney disease) stage 3, GFR 30-59 ml/min (HCC)    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   DDD (degenerative disc disease), cervical  Dysrhythmia    post carotid stent bradycardia; PAF 09/2020   GERD (gastroesophageal reflux disease)    Hypercholesterolemia    Hypertension    Hypothyroidism    pt takes Levothyroxine daily   Lumbosacral spinal stenosis    Myasthenia gravis, adult form (HCC)    PAD (peripheral artery disease) (HCC)    Parkinson disease (HCC)    Shortness of breath    Lung MD- Dr Joetta Manners   Sleep apnea    do not use CPAP every night   Stroke (HCC) 05/31/2023    Past Surgical History:  Procedure Laterality Date   ANTERIOR CERVICAL DECOMP/DISCECTOMY FUSION  07/18/2011   Procedure: ANTERIOR CERVICAL DECOMPRESSION/DISCECTOMY FUSION 2 LEVELS;  Surgeon: Kathaleen Maser Pool;  Location: [facility];  Service: Neurosurgery;  Laterality: N/A;  cervical five-six, cervical six-seven anterior cervical discectomy and fusion    ATRIAL FIBRILLATION ABLATION     BACK SURGERY     in 1985 [facility]   BILATERAL CARPAL TUNNEL RELEASE     01/2020 Right, 04/2020 Left   CARDIAC CATHETERIZATION     2005 at [facility], no stents   CAROTID PTA/STENT INTERVENTION N/A 09/17/2020   Procedure: CAROTID PTA/STENT INTERVENTION;  Surgeon: Annice Needy, MD;  Location: [facility];  Service: Cardiovascular;  Laterality: N/A;   CATARACT EXTRACTION W/PHACO Left 01/06/2020   Procedure: CATARACT EXTRACTION PHACO AND INTRAOCULAR LENS PLACEMENT (IOC) ISTENT INJ LEFT 3.81  00:33.3;  Surgeon: Nevada Crane, MD;  Location: [facility];  Service: Ophthalmology;  Laterality: Left;   CATARACT EXTRACTION W/PHACO Right 02/03/2020   Procedure: CATARACT EXTRACTION PHACO AND INTRAOCULAR LENS PLACEMENT (IOC) RIGHT ISTENT INJ;  Surgeon: Nevada Crane, MD;  Location: [facility];  Service: Ophthalmology;  Laterality: Right;  4.29 0:35.6   COLONOSCOPY     HERNIA REPAIR Left    inguinal hernia repair in 1985   LUMBAR LAMINECTOMY/DECOMPRESSION MICRODISCECTOMY Left 02/24/2014   Procedure: LUMBAR LAMINECTOMY/DECOMPRESSION MICRODISCECTOMY LUMBAR THREE-FOUR, FOUR-FIVE, LEFT FIVE-SACRAL ONE ;  Surgeon: Temple Pacini, MD;  Location: [facility];  Service: Neurosurgery;  Laterality: Left;  LUMBAR LAMINECTOMY/DECOMPRESSION MICRODISCECTOMY LUMBAR THREE-FOUR, FOUR-FIVE, LEFT FIVE-SACRAL ONE    LUMBAR LAMINECTOMY/DECOMPRESSION MICRODISCECTOMY N/A 05/03/2021   Procedure: Laminectomy and Foraminotomy - L2-L3;  Surgeon: Julio Sicks, MD;  Location: [facility];  Service: Neurosurgery;  Laterality: N/A;  3C   POSTERIOR CERVICAL FUSION/FORAMINOTOMY N/A 08/07/2020   Procedure: C3-6 POSTERIOR FUSION WITH DECOMPRESSION;  Surgeon: Venetia Night, MD;  Location: [facility];  Service: Neurosurgery;  Laterality: N/A;   PROSTATECTOMY  04/2013   [facility] Dr Joycelyn Das      Social History   Tobacco Use   Smoking status: Former    Current packs/day: 0.00     Average packs/day: 1 pack/day for 20.0 years (20.0 ttl pk-yrs)    Types: Cigarettes    Start date: 09/12/1981    Quit date: 09/12/2001    Years since quitting: 21.8   Smokeless tobacco: Never  Vaping Use   Vaping status: Never Used  Substance Use Topics   Alcohol use: Yes    Alcohol/week: 3.0 standard drinks of alcohol    Types: 3 Glasses of wine per week    Comment: 3 glasses a wine a week   Drug use: No      Family History  Problem Relation Age of Onset   Hypertension Mother    Stroke Mother    Stroke Father      Allergies  Allergen Reactions  Azithromycin Other (See Comments)    Avoid due to myasthenia gravis   Codeine Nausea And Vomiting     REVIEW OF SYSTEMS (Negative unless checked)   Constitutional: [] Weight loss  [] Fever  [] Chills Cardiac: [] Chest pain   [] Chest pressure   [] Palpitations   [] Shortness of breath when laying flat   [] Shortness of breath at rest   [] Shortness of breath with exertion. Vascular:  [] Pain in legs with walking   [] Pain in legs at rest   [] Pain in legs when laying flat   [] Claudication   [] Pain in feet when walking  [] Pain in feet at rest  [] Pain in feet when laying flat   [] History of DVT   [] Phlebitis   [x] Swelling in legs   [] Varicose veins   [] Non-healing ulcers Pulmonary:   [] Uses home oxygen   [] Productive cough   [] Hemoptysis   [] Wheeze  [x] COPD   [] Asthma Neurologic:  [] Dizziness  [] Blackouts   [] Seizures   [] History of stroke   [] History of TIA  [] Aphasia   [] Temporary blindness   [] Dysphagia   [] Weakness or numbness in arms   [] Weakness or numbness in legs Musculoskeletal:  [x] Arthritis   [] Joint swelling   [x] Joint pain   [x] Low back pain Hematologic:  [] Easy bruising  [] Easy bleeding   [] Hypercoagulable state   [x] Anemic  [] Hepatitis Gastrointestinal:  [] Blood in stool   [] Vomiting blood  [] Gastroesophageal reflux/heartburn   [] Difficulty swallowing. Genitourinary:  [x] Chronic kidney disease   [] Difficult urination  [] Frequent  urination  [] Burning with urination   [] Blood in urine Skin:  [] Rashes   [] Ulcers   [] Wounds Psychological:  [] History of anxiety   []  History of major depression.    Physical Examination  Vitals:   08/01/23 1053  BP: 105/64  Pulse: 65  Resp: 18  Weight: 220 lb (99.8 kg)  Height: 5\' 10"  (1.778 m)   Body mass index is 31.57 kg/m. Gen:  WD/WN, NAD Head: [place]/AT, No temporalis wasting. Ear/Nose/Throat: Hearing grossly intact, nares w/o erythema or drainage, trachea midline Eyes: Conjunctiva clear. Sclera non-icteric Neck: Supple.  No bruit  Pulmonary:  Good air movement, equal and clear to auscultation bilaterally.  Cardiac: RRR, No JVD Vascular:  Vessel Right Left  Radial Palpable Palpable               Musculoskeletal: Diffuse lower extremity weakness which is stable.  In a wheelchair.  Normal upper extremity strength. Neurologic: CN 2-12 intact. Sensation grossly intact in extremities.  Symmetrical.  Speech is fluent. Psychiatric: Judgment intact, Mood & affect appropriate for pt's clinical situation. Dermatologic: No rashes or ulcers noted.  No cellulitis or open wounds.     CBC Lab Results  Component Value Date   WBC 6.2 05/31/2023   HGB 11.1 (L) 05/31/2023   HCT 35.8 (L) 05/31/2023   MCV 115.1 (H) 05/31/2023   PLT 232 05/31/2023    BMET    Component Value Date/Time   NA 138 06/01/2023 0510   NA 141 03/04/2014 0450   K 3.9 06/01/2023 0510   K 3.9 03/04/2014 0450   CL 104 06/01/2023 0510   CL 104 03/04/2014 0450   CO2 29 06/01/2023 0510   CO2 28 03/04/2014 0450   GLUCOSE 87 06/01/2023 0510   GLUCOSE 100 (H) 03/04/2014 0450   BUN 17 06/01/2023 0510   BUN 25 (H) 03/04/2014 0450   CREATININE 0.90 06/01/2023 0510   CREATININE 0.99 03/04/2014 0450   CALCIUM 8.3 (L) 06/01/2023 0510   CALCIUM 9.0 03/04/2014  0450   GFRNONAA >60 06/01/2023 0510   GFRNONAA >60 03/04/2014 0450   GFRAA >60 03/04/2014 0450   CrCl cannot be calculated (Patient's most recent  lab result is older than the maximum 21 days allowed.).  COAG Lab Results  Component Value Date   INR 1.3 (H) 05/29/2023   INR 1.4 (H) 04/28/2022   INR 1.2 12/30/2021    Radiology No results found.   Assessment/Plan COPD (chronic obstructive pulmonary disease) Followed by Pulmonology   Essential hypertension, benign blood pressure control important in reducing the progression of atherosclerotic disease. On appropriate oral medications.   Pure hypercholesterolemia lipid control important in reducing the progression of atherosclerotic disease. Continue statin therapy   Bilateral carotid artery disease (HCC) Duplex today shows his right carotid stent to be widely patent with 40 to 59% left ICA stenosis.  Doing well.  Continue current medical regimen.  Recheck in 12 months.   Festus Barren, MD  08/01/2023 11:20 AM    This note was created with Dragon medical transcription system.  Any errors from dictation are purely unintentional

## 2023-08-03 ENCOUNTER — Ambulatory Visit: Payer: Medicare Other

## 2023-08-03 DIAGNOSIS — R2681 Unsteadiness on feet: Secondary | ICD-10-CM

## 2023-08-03 DIAGNOSIS — M545 Low back pain, unspecified: Secondary | ICD-10-CM

## 2023-08-03 DIAGNOSIS — G8929 Other chronic pain: Secondary | ICD-10-CM

## 2023-08-03 DIAGNOSIS — R278 Other lack of coordination: Secondary | ICD-10-CM

## 2023-08-03 DIAGNOSIS — R262 Difficulty in walking, not elsewhere classified: Secondary | ICD-10-CM | POA: Diagnosis not present

## 2023-08-03 DIAGNOSIS — R296 Repeated falls: Secondary | ICD-10-CM

## 2023-08-03 DIAGNOSIS — M6281 Muscle weakness (generalized): Secondary | ICD-10-CM

## 2023-08-03 DIAGNOSIS — G959 Disease of spinal cord, unspecified: Secondary | ICD-10-CM

## 2023-08-03 DIAGNOSIS — R2689 Other abnormalities of gait and mobility: Secondary | ICD-10-CM

## 2023-08-03 DIAGNOSIS — R269 Unspecified abnormalities of gait and mobility: Secondary | ICD-10-CM

## 2023-08-03 NOTE — Therapy (Signed)
OUTPATIENT PHYSICAL THERAPY TREATMENT/RECERT   Patient Name: Francisco [name] MRN: [id number] DOB:[birth year], 77 y.o., male Today's Date: 08/03/2023  PCP: Lynnea Ferrier, MD REFERRING PROVIDER: Venetia Night MD   PT End of Session - 08/03/23 1407     Visit Number 86    Number of Visits 109    Date for PT Re-Evaluation 10/17/23    Authorization Type [employer] Medicare    Authorization Time Period 02/02/23-04/27/23    Progress Note Due on Visit 90    PT Start Time 1401    PT Stop Time 1444    PT Time Calculation (min) 43 min    Equipment Utilized During Treatment Gait belt    Activity Tolerance Patient tolerated treatment well;Patient limited by fatigue;No increased pain    Behavior During Therapy WFL for tasks assessed/performed                         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   Carotid stenosis    CHF (congestive heart failure) (HCC)    CKD (chronic kidney disease)    CKD (chronic kidney disease) stage 3, GFR 30-59 ml/min (HCC)    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   DDD (degenerative disc disease), cervical    Dysrhythmia    post carotid stent bradycardia; PAF 09/2020   GERD (gastroesophageal reflux disease)    Hypercholesterolemia    Hypertension    Hypothyroidism    pt takes Levothyroxine daily   Lumbosacral spinal stenosis    Myasthenia gravis, adult form (HCC)    PAD (peripheral artery disease) (HCC)    Parkinson disease (HCC)    Shortness of breath    Lung MD- Dr Joetta Manners   Sleep apnea    do not use CPAP every night   Stroke (HCC) 05/31/2023   Past Surgical History:  Procedure Laterality Date   ANTERIOR CERVICAL  DECOMP/DISCECTOMY FUSION  07/18/2011   Procedure: ANTERIOR CERVICAL DECOMPRESSION/DISCECTOMY FUSION 2 LEVELS;  Surgeon: Kathaleen Maser Pool;  Location: [facility];  Service: Neurosurgery;  Laterality: N/A;  cervical five-six, cervical six-seven anterior cervical discectomy and fusion   ATRIAL FIBRILLATION ABLATION     BACK SURGERY     in 1985 [facility]   BILATERAL CARPAL TUNNEL RELEASE     01/2020 Right, 04/2020 Left   CARDIAC CATHETERIZATION     2005 at [facility], no stents   CAROTID PTA/STENT INTERVENTION N/A 09/17/2020   Procedure: CAROTID PTA/STENT INTERVENTION;  Surgeon: Annice Needy, MD;  Location: [facility];  Service: Cardiovascular;  Laterality: N/A;   CATARACT EXTRACTION W/PHACO Left 01/06/2020   Procedure: CATARACT EXTRACTION PHACO AND INTRAOCULAR LENS PLACEMENT (IOC) ISTENT INJ LEFT 3.81  00:33.3;  Surgeon: Nevada Crane, MD;  Location: [facility];  Service: Ophthalmology;  Laterality: Left;   CATARACT EXTRACTION W/PHACO Right 02/03/2020   Procedure: CATARACT EXTRACTION PHACO AND INTRAOCULAR LENS PLACEMENT (IOC) RIGHT ISTENT INJ;  Surgeon: Nevada Crane, MD;  Location: [facility];  Service: Ophthalmology;  Laterality: Right;  4.29 0:35.6   COLONOSCOPY  HERNIA REPAIR Left    inguinal hernia repair in 1985   LUMBAR LAMINECTOMY/DECOMPRESSION MICRODISCECTOMY Left 02/24/2014   Procedure: LUMBAR LAMINECTOMY/DECOMPRESSION MICRODISCECTOMY LUMBAR THREE-FOUR, FOUR-FIVE, LEFT FIVE-SACRAL ONE ;  Surgeon: Temple Pacini, MD;  Location: [facility];  Service: Neurosurgery;  Laterality: Left;  LUMBAR LAMINECTOMY/DECOMPRESSION MICRODISCECTOMY LUMBAR THREE-FOUR, FOUR-FIVE, LEFT FIVE-SACRAL ONE    LUMBAR LAMINECTOMY/DECOMPRESSION MICRODISCECTOMY N/A 05/03/2021   Procedure: Laminectomy and Foraminotomy - L2-L3;  Surgeon: Julio Sicks, MD;  Location: [facility];  Service: Neurosurgery;  Laterality: N/A;  3C   POSTERIOR CERVICAL FUSION/FORAMINOTOMY N/A 08/07/2020   Procedure:  C3-6 POSTERIOR FUSION WITH DECOMPRESSION;  Surgeon: Venetia Night, MD;  Location: [facility];  Service: Neurosurgery;  Laterality: N/A;   PROSTATECTOMY  04/2013   [facility] Dr Joycelyn Das    Patient Active Problem List   Diagnosis Date Noted   Stroke [facility]) 05/31/2023   Obesity (BMI 30-39.9) 05/31/2023   HLD (hyperlipidemia) 05/31/2023   Chronic diastolic CHF (congestive heart failure) (HCC) 05/31/2023   Hyperkalemia 05/31/2023   SVT (supraventricular tachycardia) (HCC) 12/30/2021   Hypothyroidism 12/30/2021   Parkinson's disease (HCC) 12/30/2021   Elevated troponin 12/30/2021   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   Acquired thrombophilia (HCC) 01/13/2021   History of decompression of median nerve 01/01/2021   Paroxysmal atrial fibrillation (HCC) 10/06/2020   Carotid stenosis, right 09/17/2020   S/P cervical spinal fusion    Leukocytosis    Essential hypertension    Anemia of chronic disease    Postoperative pain    Neuropathic pain    Cervical myelopathy (HCC) 08/07/2020   Preop cardiovascular exam 07/17/2020   SOB (shortness of breath) on exertion 07/17/2020   Leg weakness, bilateral 07/13/2020   Aortic atherosclerosis (HCC) 07/09/2020   Body mass index (BMI) 34.0-34.9, adult 12/25/2019   Myalgia 10/30/2019   Lumbar post-laminectomy syndrome 10/24/2019   PAD (peripheral artery disease) (HCC) 06/06/2019   CKD (chronic kidney disease) stage 3, GFR 30-59 ml/min (HCC) 04/29/2019   B12 deficiency 01/23/2019   Left arm numbness 02/28/2018   Neck pain 02/28/2018   Anemia 02/02/2017   DDD (degenerative disc disease), cervical 02/02/2017   Hypothyroid 02/02/2017   MRSA (methicillin resistant staph aureus) culture positive 02/02/2017   Nocturnal hypoxia 02/02/2017   Senile purpura (HCC) 10/03/2016   Essential hypertension, benign 09/16/2016   Bilateral carotid artery disease (HCC) 09/16/2016   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/18/2016   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   History of prostate cancer 12/21/2015   Left carpal tunnel syndrome 11/11/2015   Kidney stone on left side 07/05/2015   Myasthenia gravis (HCC) 05/26/2015   Radiculitis 01/05/2015   Long-term use of high-risk medication 10/15/2014   Persistent cough 09/10/2014   Pure hypercholesterolemia 07/25/2014   Spinal stenosis, lumbar region, with neurogenic claudication 02/24/2014   Lumbosacral stenosis with neurogenic claudication 02/24/2014   COPD (chronic obstructive pulmonary disease) (HCC) 01/22/2014    REFERRING DIAG: balance disorder   THERAPY DIAG:  Difficulty in walking, not elsewhere classified  Abnormality of gait and mobility  Chronic left-sided low back pain with left-sided sciatica  Muscle weakness (generalized)  Repeated falls  Unsteadiness on feet  Other abnormalities of gait and mobility  Chronic bilateral low back pain, unspecified whether sciatica present  Other lack of coordination  Cervical myelopathy (HCC)  Rationale for Evaluation and Treatment Rehabilitation  PERTINENT HISTORY: Orlyn Puck is a 76 year old male referred to [facility] neuro for difficulty with balance. He was also just recently  dx in May 2023 with Impingement syndrome of left shoulder (M75.42) Impingement syndrome of left shoulder (primary encounter diagnosis) Left shoulder pain, unspecified chronicity Nontraumatic rupture of left proximal biceps tendon Past medical hx includes Lumbar laminectomy 04/2021. C3-6 decompression, fusion on 11/26, myasthenia gravis, 2012 ACDF 5/6, 6/7; OSA, hypothyroidism, HTN, COPD, multiple back surgeries, 2021 carpal tunnel release.  PRECAUTIONS: Fall  SUBJECTIVE: Pt reports being busy with medical appointments with his wife who recently had malignant tumor removed. He states he had a few pretty good days this week but feeling tired today.     PAIN:  Are you having pain? no   TODAY'S TREATMENT:    08/03/23     Therex: in //  bars  1) stand at one end of bars- perform heel raises -x 10 reps then large step walk forward to opp end- then toe raises x 10 reps- then walk backward- large steps - repeat all x 2 sets.  2) stand at one end of bars- ham curls each LE x 10 then large step walk forward to opp end- then high knee march - 10 reps each LE- then walk backward large steps- repeat for 2 sets. 3) Stand at one end of bars- perform standing lumbar flex into ext x 10 reps - then large forward steps to opp end- then 10 active scap rows- then walk backward large steps - repeat 2 sets  4) Stand at one end of bars- Perform minisquats x 10 reps then side step to right down to opp end - then perform 10 standing hip ER- then side step back to start- repeat 2  sets.       Pt educated throughout session about proper posture and technique with exercises. Improved exercise technique, movement at target joints, use of target muscles after min to mod verbal, visual, tactile cues.   PATIENT EDUCATION: Education details: Goals, plan, exercise technique, body mechanics, energy conservation techniques  Person educated: Patient Education method: Explanation, Demonstration, Tactile cues, Verbal cues, and Handouts Education comprehension: verbalized understanding, returned demonstration, verbal cues required, and tactile cues required   HOME EXERCISE PROGRAM:  No Updates today  Access Code: [email or web address] URL: [email or web address] Date: 03/01/2022 Prepared by: Precious Bard  Exercises - Seated March  - 1 x daily - 7 x weekly - 2 sets - 10 reps - 5 hold - Seated Long Arc Quad  - 1 x daily - 7 x weekly - 2 sets - 10 reps - 5 hold - Seated Hip Abduction  - 1 x daily - 7 x weekly - 2 sets - 10 reps - 5 hold - Seated Hip Adduction Isometrics with Ball  - 1 x daily - 7 x weekly - 2 sets - 10 reps - 5 hold - Seated Gluteal Sets  - 1 x daily - 7 x weekly - 2 sets - 10 reps - 5 hold - Seated Heel Toe Raises  - 1 x daily - 7 x  weekly - 2 sets - 10 reps - 5 hold LE strength and balance      PT Short Term Goals       PT SHORT TERM GOAL #1   Title Pt will be independent with initial HEP in order to improve strength and balance in order to decrease fall risk and improve function at home and work.    Baseline 02/23/2022- Patient reports not doing much as far as exercise in the home currently. 04/20/2022- Patient reports compliant with HEP and no questions at  this time.; 9/7 pt indep   Time 6    Period Weeks    Status Goal met   Target Date 04/06/22              PT Long Term Goals       PT LONG TERM GOAL #1   Title Pt will be independent with final HEP in order to improve strength and balance in order to decrease fall risk and improve function at home and work. 04/20/2022- Patient reports compliant with HEP and no questions at this time.    Baseline 02/23/2022= No formal HEP in place. 9/7: pt performing at least once a day, feels comfortable   Time 12    Period Weeks    Status GOAL MET   Target Date 08/11/2022      PT LONG TERM GOAL #2   Title Pt will decrease 5TSTS by at least 8 seconds in order to demonstrate clinically significant improvement in LE strength.    Baseline 02/23/2022= 31.52 sec with B hands on knees; 04/20/2022= 16.08 sec with B hands on knees   Time 12    Period Days    Status Goal Met   Target Date 05/18/22      PT LONG TERM GOAL #3   Title Pt will increase by at least 0.23 m/s in order to demonstrate clinically significant improvement in community ambulation.    Baseline 02/23/2022= 0.39 m/s using RW; 0.45 m/s; 9/7: 0.37 m/s with QC, 0.74 m/s with RW   Time 12    Period Weeks    Status GOAL MET   Target Date 05/18/22      PT LONG TERM GOAL #4   Title Pt will improve FOTO to target score of 50 to display perceived improvements in ability to complete ADL's.    Baseline 02/23/2022= 47; 9/7: 49; 11/30=54%   Time 12    Period Weeks    Status GOAL MET   Target Date 08/11/2022       PT LONG TERM GOAL #5   Title Pt will decrease TUG to below 20 seconds/decrease in order to demonstrate decreased fall risk.    Baseline 02/23/2022=28.27 sec with RW; 04/20/2022= 20.22 with SBQC; 9/7: 14.4 sec with RW, 18.5 sec with SBQC   Time 12    Period Weeks    Status MET   Target Date 05/18/22    PT LONG TERM GOAL #6  Title Pt will decrease TUG to 14 sec or below with SBQC in order to demonstrate decreased fall risk.   Baseline  9/7: 14.4 sec with RW, 18.5 sec with SBQC; 11/30=16.85 sec without an AD. 09/15/2022= 22.14 sec with SPC; 11/08/2022= 25.17 sec without and AD and 21.22 sec with SPC. 01/31/2023= 22.64 sec without a AD and 24.64 sec with SBQC;  03/09/2023= 22.04 sec with SBQC and 14.88 sec with 4WW.  05/02/2023= 20.46 sec with SBQC and 14.22 sec with 4WW; 06/21/2023= 16.47 sec with SBQC. 07/25/2023= 22.9 sec with SBQC  Time 12   Period Weeks   Status ONGOING  Target Date 10/17/2023   PT LONG TERM GOAL #7  Title Patient will increase six minute walk test distance to >700 ft for improved gait ability and increased ease with community participation.  Baseline 9/7: completes 4 minutes and 444 ft with RW. Test discontinued at 4 minutes due to fatgiue. 08/11/2022= patient ambulated 200 feet yet required 1 seated rest break- due to BLE fatigue- using SBQC: 09/15/2022= Patient ambulated 300 feet in  4 min 20 sec with 1 seated rest break using SBQC. 11/08/2022 = 175 feet- stopped due to exhausted today- 2 min 10 sec; 01/17/2023= 200 feet in 2 min 38 sec; 02/02/2023= 160 feet with use of SBQC in 3 min 45 sec prior to stopping. 6/27 430 feet in 40 sec; 04/25/2023= 6 min complete - 500 feet using 4WW; 06/22/2023= 630 feet with 4WW; 07/25/2023= 460 feet in 4 min 17 sec.   Time 12   Period Weeks   Status ONGOING  Target Date 07/25/2023   PT LONG TERM GOAL #8  Title Pt will decrease 5TSTS by at least 2 seconds (without UE support)  in order to demonstrate clinically significant improvement in LE strength.    Baseline 08/11/2022= 16.33 sec with B hands on knees; 09/15/2022= 22 sec with B hands on knees (patient reports increased weakness since around Christmas and just has not bounced back); 11/08/2022= 18.85 without UE Support; 01/17/2023=30.0 sec without UE support- patient reported not having a good day but tried his best. 02/02/2023- 17.74 sec with UE Support; 03/09/2023 =15.82 sec without UE support; 04/25/2023= 15.77 sec; 06/21/2023= 17.09 sec without UE support. 07/25/2023= 16.35 sec without UE support  Time 12   Period Weeks  Status ONGOING  Target Date 10/17/2023        PT LONG TERM GOAL #9  Title Pt will improve BERG by at least 3 points in order to demonstrate clinically significant improvement in balance.   Baseline 11/08/2022= 33/56; 01/17/2023= 45/56; 02/02/2023=45/56; 05/02/2023=45/56;   Time 12   Period Weeks  Status ONGOING  Target Date 07/25/2023       Plan -     Clinical Impression Statement Patient presents with good motivation for today's session. He was able to stand better today and spend the majority of session on his feet today which is vastly improved from some previous sessions. He was able to target some weak muscle including low back extensors and anterior tib. Patient will continue to benefit from skilled PT services to address deficits and impairment identified in evaluation in order to maximize independence and safety in basic mobility required for performance of ADL, IADL, and leisure.    Personal Factors and Comorbidities Age;Past/Current Experience;Time since onset of injury/illness/exacerbation;Comorbidity 3+    Comorbidities COPD, History of Cancer, myasthenia gravis, chronic lumbar surgical history    Examination-Activity Limitations Lift;Squat;Bend;Stairs;Stand;Transfers;Probation officer;Bathing;Hygiene/Grooming;Dressing;Toileting;Bed Mobility;Caring for Others;Continence    Examination-Participation Restrictions Yard  Work;Church;Cleaning;Driving;Community Activity    Stability/Clinical Decision Making Evolving/Moderate complexity    Rehab Potential Good    Clinical Impairments Affecting Rehab Potential multipe comorbidities    PT Frequency 1-2x / week    PT Duration 12 weeks    PT Treatment/Interventions ADLs/Self Care Home Management;Electrical Stimulation;Moist Heat;Traction;Ultrasound;Gait training;Stair training;Functional mobility training;Therapeutic activities;Therapeutic exercise;Balance training;Neuromuscular re-education;Patient/family education;Manual techniques;Passive range of motion;Dry needling;Joint Manipulations;Cryotherapy;Vestibular;Canalith Repostioning    PT Next Visit Plan Progress LE strengthening, balance training,  and functional endurance.   PT Home Exercise Plan standing strengthening and balance, seated strengthening and coordination.    Consulted and Agree with Plan of Care Patient            2:50 PM, 08/03/23     Lenda Kelp PT Physical Therapist - [facility]  Out[facility] [phone]     08/03/23, 2:50 PM

## 2023-08-08 ENCOUNTER — Ambulatory Visit: Payer: Medicare Other

## 2023-08-15 ENCOUNTER — Ambulatory Visit: Payer: Medicare Other | Attending: Neurosurgery

## 2023-08-15 DIAGNOSIS — R2689 Other abnormalities of gait and mobility: Secondary | ICD-10-CM | POA: Diagnosis present

## 2023-08-15 DIAGNOSIS — R2681 Unsteadiness on feet: Secondary | ICD-10-CM | POA: Diagnosis present

## 2023-08-15 DIAGNOSIS — M5442 Lumbago with sciatica, left side: Secondary | ICD-10-CM | POA: Diagnosis present

## 2023-08-15 DIAGNOSIS — R262 Difficulty in walking, not elsewhere classified: Secondary | ICD-10-CM

## 2023-08-15 DIAGNOSIS — M545 Low back pain, unspecified: Secondary | ICD-10-CM | POA: Diagnosis present

## 2023-08-15 DIAGNOSIS — R269 Unspecified abnormalities of gait and mobility: Secondary | ICD-10-CM

## 2023-08-15 DIAGNOSIS — G8929 Other chronic pain: Secondary | ICD-10-CM

## 2023-08-15 DIAGNOSIS — M6281 Muscle weakness (generalized): Secondary | ICD-10-CM | POA: Diagnosis present

## 2023-08-15 DIAGNOSIS — R296 Repeated falls: Secondary | ICD-10-CM

## 2023-08-15 NOTE — Therapy (Signed)
OUTPATIENT PHYSICAL THERAPY TREATMENT   Patient Name: Francisco [name] MRN: [id number] DOB:21-Dec-1945, 77 y.o., male Today's Date: 08/15/2023  PCP: Lynnea Ferrier, MD REFERRING PROVIDER: Venetia Night MD   PT End of Session - 08/15/23 1405     Visit Number 87    Number of Visits 109    Date for PT Re-Evaluation 10/17/23    Authorization Type [employer] Medicare    Authorization Time Period 02/02/23-04/27/23    Progress Note Due on Visit 90    PT Start Time 1402    PT Stop Time 1442    PT Time Calculation (min) 40 min    Equipment Utilized During Treatment Gait belt    Activity Tolerance Patient tolerated treatment well;Patient limited by fatigue;No increased pain    Behavior During Therapy WFL for tasks assessed/performed                          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   Carotid stenosis    CHF (congestive heart failure) (HCC)    CKD (chronic kidney disease)    CKD (chronic kidney disease) stage 3, GFR 30-59 ml/min (HCC)    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   DDD (degenerative disc disease), cervical    Dysrhythmia    post carotid stent bradycardia; PAF 09/2020   GERD (gastroesophageal reflux disease)    Hypercholesterolemia    Hypertension    Hypothyroidism    pt takes Levothyroxine daily   Lumbosacral spinal stenosis    Myasthenia gravis, adult form (HCC)    PAD (peripheral artery disease) (HCC)    Parkinson disease (HCC)    Shortness of breath    Lung MD- Dr Joetta Manners   Sleep apnea    do not use CPAP every night   Stroke (HCC) 05/31/2023   Past Surgical History:  Procedure Laterality Date   ANTERIOR CERVICAL  DECOMP/DISCECTOMY FUSION  07/18/2011   Procedure: ANTERIOR CERVICAL DECOMPRESSION/DISCECTOMY FUSION 2 LEVELS;  Surgeon: Kathaleen Maser Pool;  Location: [facility];  Service: Neurosurgery;  Laterality: N/A;  cervical five-six, cervical six-seven anterior cervical discectomy and fusion   ATRIAL FIBRILLATION ABLATION     BACK SURGERY     in 1985 [facility]   BILATERAL CARPAL TUNNEL RELEASE     01/2020 Right, 04/2020 Left   CARDIAC CATHETERIZATION     2005 at [facility], no stents   CAROTID PTA/STENT INTERVENTION N/A 09/17/2020   Procedure: CAROTID PTA/STENT INTERVENTION;  Surgeon: Annice Needy, MD;  Location: [facility] INVASIVE CV LAB;  Service: Cardiovascular;  Laterality: N/A;   CATARACT EXTRACTION W/PHACO Left 01/06/2020   Procedure: CATARACT EXTRACTION PHACO AND INTRAOCULAR LENS PLACEMENT (IOC) ISTENT INJ LEFT 3.81  00:33.3;  Surgeon: Nevada Crane, MD;  Location: [facility];  Service: Ophthalmology;  Laterality: Left;   CATARACT EXTRACTION W/PHACO Right 02/03/2020   Procedure: CATARACT EXTRACTION PHACO AND INTRAOCULAR LENS PLACEMENT (IOC) RIGHT ISTENT INJ;  Surgeon: Nevada Crane, MD;  Location: [facility];  Service: Ophthalmology;  Laterality: Right;  4.29 0:35.6   COLONOSCOPY  HERNIA REPAIR Left    inguinal hernia repair in 1985   LUMBAR LAMINECTOMY/DECOMPRESSION MICRODISCECTOMY Left 02/24/2014   Procedure: LUMBAR LAMINECTOMY/DECOMPRESSION MICRODISCECTOMY LUMBAR THREE-FOUR, FOUR-FIVE, LEFT FIVE-SACRAL ONE ;  Surgeon: Temple Pacini, MD;  Location: [facility];  Service: Neurosurgery;  Laterality: Left;  LUMBAR LAMINECTOMY/DECOMPRESSION MICRODISCECTOMY LUMBAR THREE-FOUR, FOUR-FIVE, LEFT FIVE-SACRAL ONE    LUMBAR LAMINECTOMY/DECOMPRESSION MICRODISCECTOMY N/A 05/03/2021   Procedure: Laminectomy and Foraminotomy - L2-L3;  Surgeon: Julio Sicks, MD;  Location: [facility] OR;  Service: Neurosurgery;  Laterality: N/A;  3C   POSTERIOR CERVICAL FUSION/FORAMINOTOMY N/A 08/07/2020   Procedure:  C3-6 POSTERIOR FUSION WITH DECOMPRESSION;  Surgeon: Venetia Night, MD;  Location: [facility] ORS;  Service: Neurosurgery;  Laterality: N/A;   PROSTATECTOMY  04/2013   [facility] Dr Joycelyn Das    Patient Active Problem List   Diagnosis Date Noted   Stroke [facility]) 05/31/2023   Obesity (BMI 30-39.9) 05/31/2023   HLD (hyperlipidemia) 05/31/2023   Chronic diastolic CHF (congestive heart failure) (HCC) 05/31/2023   Hyperkalemia 05/31/2023   SVT (supraventricular tachycardia) (HCC) 12/30/2021   Hypothyroidism 12/30/2021   Parkinson's disease (HCC) 12/30/2021   Elevated troponin 12/30/2021   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   Acquired thrombophilia (HCC) 01/13/2021   History of decompression of median nerve 01/01/2021   Paroxysmal atrial fibrillation (HCC) 10/06/2020   Carotid stenosis, right 09/17/2020   S/P cervical spinal fusion    Leukocytosis    Essential hypertension    Anemia of chronic disease    Postoperative pain    Neuropathic pain    Cervical myelopathy (HCC) 08/07/2020   Preop cardiovascular exam 07/17/2020   SOB (shortness of breath) on exertion 07/17/2020   Leg weakness, bilateral 07/13/2020   Aortic atherosclerosis (HCC) 07/09/2020   Body mass index (BMI) 34.0-34.9, adult 12/25/2019   Myalgia 10/30/2019   Lumbar post-laminectomy syndrome 10/24/2019   PAD (peripheral artery disease) (HCC) 06/06/2019   CKD (chronic kidney disease) stage 3, GFR 30-59 ml/min (HCC) 04/29/2019   B12 deficiency 01/23/2019   Left arm numbness 02/28/2018   Neck pain 02/28/2018   Anemia 02/02/2017   DDD (degenerative disc disease), cervical 02/02/2017   Hypothyroid 02/02/2017   MRSA (methicillin resistant staph aureus) culture positive 02/02/2017   Nocturnal hypoxia 02/02/2017   Senile purpura (HCC) 10/03/2016   Essential hypertension, benign 09/16/2016   Bilateral carotid artery disease (HCC) 09/16/2016   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/18/2016   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   History of prostate cancer 12/21/2015   Left carpal tunnel syndrome 11/11/2015   Kidney stone on left side 07/05/2015   Myasthenia gravis (HCC) 05/26/2015   Radiculitis 01/05/2015   Long-term use of high-risk medication 10/15/2014   Persistent cough 09/10/2014   Pure hypercholesterolemia 07/25/2014   Spinal stenosis, lumbar region, with neurogenic claudication 02/24/2014   Lumbosacral stenosis with neurogenic claudication 02/24/2014   COPD (chronic obstructive pulmonary disease) (HCC) 01/22/2014    REFERRING DIAG: balance disorder   THERAPY DIAG:  Difficulty in walking, not elsewhere classified  Abnormality of gait and mobility  Chronic left-sided low back pain with left-sided sciatica  Muscle weakness (generalized)  Repeated falls  Unsteadiness on feet  Other abnormalities of gait and mobility  Chronic bilateral low back pain, unspecified whether sciatica present  Rationale for Evaluation and Treatment Rehabilitation  PERTINENT HISTORY: Aemon Faller is a 77 year old male referred to OPPT neuro for difficulty with balance. He was also just recently dx in May 2023 with Impingement syndrome of left  shoulder (M75.42) Impingement syndrome of left shoulder (primary encounter diagnosis) Left shoulder pain, unspecified chronicity Nontraumatic rupture of left proximal biceps tendon Past medical hx includes Lumbar laminectomy 04/2021. C3-6 decompression, fusion on 11/26, myasthenia gravis, 2012 ACDF 5/6, 6/7; OSA, hypothyroidism, HTN, COPD, multiple back surgeries, 2021 carpal tunnel release.  PRECAUTIONS: Fall  SUBJECTIVE: Patient reports having a rough week. Reports trying to do more around the house since his wife is still recovering from surgery. He states he is not at his best this week.      PAIN:  Are you having pain? no   TODAY'S TREATMENT:    08/15/23     Therex:   Step tap onto 1st step x 20 reps alt LE  Step tap onto 2nd step  (with RLE) and continued onto 1st step with LLE- 12 reps. Seated hip march 2 sets of 10 reps (minimal Height on left LE)  Seated ham curl- GTB- 2 sets 10 reps  Gait using 4WW- 175 feet - Patient reports "one lap is all I have today." Seated calf raise on 1/2 foam roll 2 x 12 reps Seated toe raise on 1/2 foam roll 2 x 12 reps  Seated hip abd Resistive with RTB 2 x 12 reps         Pt educated throughout session about proper posture and technique with exercises. Improved exercise technique, movement at target joints, use of target muscles after min to mod verbal, visual, tactile cues.   PATIENT EDUCATION: Education details: Goals, plan, exercise technique, body mechanics, energy conservation techniques  Person educated: Patient Education method: Explanation, Demonstration, Tactile cues, Verbal cues, and Handouts Education comprehension: verbalized understanding, returned demonstration, verbal cues required, and tactile cues required   HOME EXERCISE PROGRAM:  No Updates today  Access Code: [email or web address] URL: [email or web address] Date: 03/01/2022 Prepared by: Precious Bard  Exercises - Seated March  - 1 x daily - 7 x weekly - 2 sets - 10 reps - 5 hold - Seated Long Arc Quad  - 1 x daily - 7 x weekly - 2 sets - 10 reps - 5 hold - Seated Hip Abduction  - 1 x daily - 7 x weekly - 2 sets - 10 reps - 5 hold - Seated Hip Adduction Isometrics with Ball  - 1 x daily - 7 x weekly - 2 sets - 10 reps - 5 hold - Seated Gluteal Sets  - 1 x daily - 7 x weekly - 2 sets - 10 reps - 5 hold - Seated Heel Toe Raises  - 1 x daily - 7 x weekly - 2 sets - 10 reps - 5 hold LE strength and balance      PT Short Term Goals       PT SHORT TERM GOAL #1   Title Pt will be independent with initial HEP in order to improve strength and balance in order to decrease fall risk and improve function at home and work.    Baseline 02/23/2022- Patient reports not doing much as far as exercise in the home  currently. 04/20/2022- Patient reports compliant with HEP and no questions at this time.; 9/7 pt indep   Time 6    Period Weeks    Status Goal met   Target Date 04/06/22              PT Long Term Goals       PT LONG TERM GOAL #1   Title Pt will be independent with final HEP in  order to improve strength and balance in order to decrease fall risk and improve function at home and work. 04/20/2022- Patient reports compliant with HEP and no questions at this time.    Baseline 02/23/2022= No formal HEP in place. 9/7: pt performing at least once a day, feels comfortable   Time 12    Period Weeks    Status GOAL MET   Target Date 08/11/2022      PT LONG TERM GOAL #2   Title Pt will decrease 5TSTS by at least 8 seconds in order to demonstrate clinically significant improvement in LE strength.    Baseline 02/23/2022= 31.52 sec with B hands on knees; 04/20/2022= 16.08 sec with B hands on knees   Time 12    Period Days    Status Goal Met   Target Date 05/18/22      PT LONG TERM GOAL #3   Title Pt will increase by at least 0.23 m/s in order to demonstrate clinically significant improvement in community ambulation.    Baseline 02/23/2022= 0.39 m/s using RW; 0.45 m/s; 9/7: 0.37 m/s with QC, 0.74 m/s with RW   Time 12    Period Weeks    Status GOAL MET   Target Date 05/18/22      PT LONG TERM GOAL #4   Title Pt will improve FOTO to target score of 50 to display perceived improvements in ability to complete ADL's.    Baseline 02/23/2022= 47; 9/7: 49; 11/30=54%   Time 12    Period Weeks    Status GOAL MET   Target Date 08/11/2022      PT LONG TERM GOAL #5   Title Pt will decrease TUG to below 20 seconds/decrease in order to demonstrate decreased fall risk.    Baseline 02/23/2022=28.27 sec with RW; 04/20/2022= 20.22 with SBQC; 9/7: 14.4 sec with RW, 18.5 sec with SBQC   Time 12    Period Weeks    Status MET   Target Date 05/18/22    PT LONG TERM GOAL #6  Title Pt will decrease TUG to 14  sec or below with SBQC in order to demonstrate decreased fall risk.   Baseline  9/7: 14.4 sec with RW, 18.5 sec with SBQC; 11/30=16.85 sec without an AD. 09/15/2022= 22.14 sec with SPC; 11/08/2022= 25.17 sec without and AD and 21.22 sec with SPC. 01/31/2023= 22.64 sec without a AD and 24.64 sec with SBQC;  03/09/2023= 22.04 sec with SBQC and 14.88 sec with 4WW.  05/02/2023= 20.46 sec with SBQC and 14.22 sec with 4WW; 06/21/2023= 16.47 sec with SBQC. 07/25/2023= 22.9 sec with SBQC  Time 12   Period Weeks   Status ONGOING  Target Date 10/17/2023   PT LONG TERM GOAL #7  Title Patient will increase six minute walk test distance to >700 ft for improved gait ability and increased ease with community participation.  Baseline 9/7: completes 4 minutes and 444 ft with RW. Test discontinued at 4 minutes due to fatgiue. 08/11/2022= patient ambulated 200 feet yet required 1 seated rest break- due to BLE fatigue- using SBQC: 09/15/2022= Patient ambulated 300 feet in 4 min 20 sec with 1 seated rest break using SBQC. 11/08/2022 = 175 feet- stopped due to exhausted today- 2 min 10 sec; 01/17/2023= 200 feet in 2 min 38 sec; 02/02/2023= 160 feet with use of SBQC in 3 min 45 sec prior to stopping. 6/27 430 feet in 40 sec; 04/25/2023= 6 min complete - 500 feet using 4WW; 06/22/2023=  630 feet with 4WW; 07/25/2023= 460 feet in 4 min 17 sec.   Time 12   Period Weeks   Status ONGOING  Target Date 07/25/2023   PT LONG TERM GOAL #8  Title Pt will decrease 5TSTS by at least 2 seconds (without UE support)  in order to demonstrate clinically significant improvement in LE strength.   Baseline 08/11/2022= 16.33 sec with B hands on knees; 09/15/2022= 22 sec with B hands on knees (patient reports increased weakness since around Christmas and just has not bounced back); 11/08/2022= 18.85 without UE Support; 01/17/2023=30.0 sec without UE support- patient reported not having a good day but tried his best. 02/02/2023- 17.74 sec with UE Support;  03/09/2023 =15.82 sec without UE support; 04/25/2023= 15.77 sec; 06/21/2023= 17.09 sec without UE support. 07/25/2023= 16.35 sec without UE support  Time 12   Period Weeks  Status ONGOING  Target Date 10/17/2023        PT LONG TERM GOAL #9  Title Pt will improve BERG by at least 3 points in order to demonstrate clinically significant improvement in balance.   Baseline 11/08/2022= 33/56; 01/17/2023= 45/56; 02/02/2023=45/56; 05/02/2023=45/56;   Time 12   Period Weeks  Status ONGOING  Target Date 07/25/2023       Plan -     Clinical Impression Statement Treatment was modified throughout session to accommodate patient fatigue level. He started off fatigued after 1st part of treatment in standing position so spent majority of remainder of visit performing some various seated therex with some level of resistance. He was more responsive and able to participate better with seated therex. He remains well motivated but physically very fatigued overall today. Patient will continue to benefit from skilled PT services to address deficits and impairment identified in evaluation in order to maximize independence and safety in basic mobility required for performance of ADL, IADL, and leisure.    Personal Factors and Comorbidities Age;Past/Current Experience;Time since onset of injury/illness/exacerbation;Comorbidity 3+    Comorbidities COPD, History of Cancer, myasthenia gravis, chronic lumbar surgical history    Examination-Activity Limitations Lift;Squat;Bend;Stairs;Stand;Transfers;Probation officer;Bathing;Hygiene/Grooming;Dressing;Toileting;Bed Mobility;Caring for Others;Continence    Examination-Participation Restrictions Yard Work;Church;Cleaning;Driving;Community Activity    Stability/Clinical Decision Making Evolving/Moderate complexity    Rehab Potential Good    Clinical Impairments Affecting Rehab Potential multipe comorbidities    PT Frequency 1-2x / week    PT Duration 12 weeks     PT Treatment/Interventions ADLs/Self Care Home Management;Electrical Stimulation;Moist Heat;Traction;Ultrasound;Gait training;Stair training;Functional mobility training;Therapeutic activities;Therapeutic exercise;Balance training;Neuromuscular re-education;Patient/family education;Manual techniques;Passive range of motion;Dry needling;Joint Manipulations;Cryotherapy;Vestibular;Canalith Repostioning    PT Next Visit Plan Progress LE strengthening, balance training,  and functional endurance.   PT Home Exercise Plan standing strengthening and balance, seated strengthening and coordination.    Consulted and Agree with Plan of Care Patient            3:03 PM, 08/15/23     Lenda Kelp PT Physical Therapist - [facility]  Outpatient Physical Therapy- [facility] [phone]     08/15/23, 3:03 PM

## 2023-08-17 ENCOUNTER — Ambulatory Visit: Payer: Medicare Other

## 2023-08-17 NOTE — Therapy (Incomplete)
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   Carotid stenosis    CHF (congestive heart failure) (HCC)    CKD (chronic kidney disease)    CKD (chronic kidney disease) stage 3, GFR 30-59 ml/min (HCC)    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   DDD (degenerative disc disease), cervical    Dysrhythmia    post carotid stent bradycardia; PAF 09/2020   GERD (gastroesophageal reflux disease)    Hypercholesterolemia    Hypertension    Hypothyroidism    pt takes Levothyroxine daily   Lumbosacral spinal stenosis    Myasthenia gravis, adult form (HCC)    PAD (peripheral artery disease) (HCC)    Parkinson disease (HCC)    Shortness of breath    Lung MD- Dr Joetta Manners   Sleep apnea    do not use CPAP every night   Stroke (HCC) 05/31/2023   Past Surgical History:  Procedure Laterality Date   ANTERIOR CERVICAL DECOMP/DISCECTOMY FUSION  07/18/2011   Procedure: ANTERIOR CERVICAL DECOMPRESSION/DISCECTOMY FUSION 2 LEVELS;  Surgeon: Kathaleen Maser Pool;  Location: [facility];  Service: Neurosurgery;  Laterality: N/A;  cervical five-six, cervical six-seven anterior cervical discectomy and fusion   ATRIAL FIBRILLATION ABLATION     BACK SURGERY     in 1985 [facility]   BILATERAL CARPAL TUNNEL RELEASE     01/2020 Right, 04/2020 Left   CARDIAC CATHETERIZATION     2005 at [facility], no stents   CAROTID PTA/STENT INTERVENTION N/A 09/17/2020   Procedure: CAROTID  PTA/STENT INTERVENTION;  Surgeon: Annice Needy, MD;  Location: [facility] INVASIVE CV LAB;  Service: Cardiovascular;  Laterality: N/A;   CATARACT EXTRACTION W/PHACO Left 01/06/2020   Procedure: CATARACT EXTRACTION PHACO AND INTRAOCULAR LENS PLACEMENT (IOC) ISTENT INJ LEFT 3.81  00:33.3;  Surgeon: Nevada Crane, MD;  Location: [facility];  Service: Ophthalmology;  Laterality: Left;   CATARACT EXTRACTION W/PHACO Right 02/03/2020   Procedure: CATARACT EXTRACTION PHACO AND INTRAOCULAR LENS PLACEMENT (IOC) RIGHT ISTENT INJ;  Surgeon: Nevada Crane, MD;  Location: [facility];  Service: Ophthalmology;  Laterality: Right;  4.29 0:35.6   COLONOSCOPY     HERNIA REPAIR Left    inguinal hernia repair in 1985   LUMBAR LAMINECTOMY/DECOMPRESSION MICRODISCECTOMY Left 02/24/2014   Procedure: LUMBAR LAMINECTOMY/DECOMPRESSION MICRODISCECTOMY LUMBAR THREE-FOUR, FOUR-FIVE, LEFT FIVE-SACRAL ONE ;  Surgeon: Temple Pacini, MD;  Location: [facility];  Service: Neurosurgery;  Laterality: Left;  LUMBAR LAMINECTOMY/DECOMPRESSION MICRODISCECTOMY LUMBAR THREE-FOUR, FOUR-FIVE, LEFT FIVE-SACRAL ONE    LUMBAR LAMINECTOMY/DECOMPRESSION MICRODISCECTOMY N/A 05/03/2021   Procedure: Laminectomy and Foraminotomy - L2-L3;  Surgeon: Julio Sicks, MD;  Location: [facility];  Service: Neurosurgery;  Laterality: N/A;  3C   POSTERIOR CERVICAL FUSION/FORAMINOTOMY N/A 08/07/2020   Procedure: C3-6 POSTERIOR FUSION WITH DECOMPRESSION;  Surgeon: Venetia Night, MD;  Location: [facility] ORS;  Service: Neurosurgery;  Laterality: N/A;   PROSTATECTOMY  04/2013   [facility] Dr Joycelyn Das    Patient Active Problem List   Diagnosis Date Noted   Stroke [facility]) 05/31/2023   Obesity (BMI 30-39.9) 05/31/2023   HLD (hyperlipidemia) 05/31/2023   Chronic diastolic CHF (congestive heart failure) (HCC) 05/31/2023   Hyperkalemia 05/31/2023   SVT (supraventricular tachycardia) (HCC) 12/30/2021   Hypothyroidism 12/30/2021   Parkinson's disease  (HCC) 12/30/2021   Elevated troponin 12/30/2021   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   Acquired thrombophilia (HCC) 01/13/2021   History of decompression of median nerve 01/01/2021   Paroxysmal atrial fibrillation (HCC) 10/06/2020   Carotid stenosis, right 09/17/2020   S/P cervical spinal fusion    Leukocytosis    Essential hypertension    Anemia of chronic disease    Postoperative pain    Neuropathic pain    Cervical myelopathy (HCC) 08/07/2020   Preop cardiovascular exam 07/17/2020   SOB (shortness of breath) on exertion 07/17/2020   Leg weakness, bilateral 07/13/2020   Aortic atherosclerosis (HCC) 07/09/2020   Body mass index (BMI) 34.0-34.9, adult 12/25/2019   Myalgia 10/30/2019   Lumbar post-laminectomy syndrome 10/24/2019   PAD (peripheral artery disease) (HCC) 06/06/2019   CKD (chronic kidney disease) stage 3, GFR 30-59 ml/min (HCC) 04/29/2019   B12 deficiency 01/23/2019   Left arm numbness 02/28/2018   Neck pain 02/28/2018   Anemia 02/02/2017   DDD (degenerative disc disease), cervical 02/02/2017   Hypothyroid 02/02/2017   MRSA (methicillin resistant staph aureus) culture positive 02/02/2017   Nocturnal hypoxia 02/02/2017   Senile purpura (HCC) 10/03/2016   Essential hypertension, benign 09/16/2016   Bilateral carotid artery disease (HCC) 09/16/2016   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/18/2016   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  Rationale for Evaluation and Treatment Rehabilitation  PERTINENT HISTORY: Francisco [name] is a 77 year old male referred to [facility] neuro for difficulty with balance. He was also just recently dx in May 2023 with Impingement syndrome of left shoulder (M75.42) Impingement syndrome of left shoulder (primary encounter diagnosis) Left shoulder pain, unspecified chronicity Nontraumatic rupture of left proximal biceps tendon Past medical hx includes Lumbar laminectomy 04/2021. C3-6 decompression, fusion on 11/26, myasthenia gravis, 2012 ACDF 5/6, 6/7; OSA, hypothyroidism, HTN, COPD, multiple back surgeries, 2021 carpal tunnel release.  PRECAUTIONS: Fall  SUBJECTIVE: *** Patient reports having a rough week. Reports trying to do more around the house since his wife is still recovering from surgery. He states he is not at his best this week.      PAIN:  Are you having pain? no   TODAY'S TREATMENT:    08/17/23    NMR:   Forward step over 4 (1/2 foam) in // bars with minimal UE support Side step over 4 (1/2 foam) in // bars with minimal UE support Near tandem static stand Step tap onto 1st step x 20 reps alt LE   Therex:  Supine:  -Knee  flex -Hip abd -Straight leg raise - Resistive gait 2.5 # AW using 4WW        Pt educated throughout session about proper posture and technique with exercises. Improved exercise technique, movement at target joints, use of target muscles after min to mod verbal, visual, tactile cues.   PATIENT EDUCATION: Education details: Goals, plan, exercise technique, body mechanics, energy conservation techniques  Person educated: Patient Education method: Explanation, Demonstration, Tactile cues, Verbal cues, and Handouts Education comprehension: verbalized understanding, returned demonstration, verbal cues required, and tactile cues required   HOME EXERCISE PROGRAM:  No Updates  today  Access Code: [email or web address] URL: [email or web address] Date: 03/01/2022 Prepared by: Precious Bard  Exercises - Seated March  - 1 x daily - 7 x weekly - 2 sets - 10 reps - 5 hold - Seated Long Arc Quad  - 1 x daily - 7 x weekly - 2 sets - 10 reps - 5 hold - Seated Hip Abduction  - 1 x daily - 7 x weekly - 2 sets - 10 reps - 5 hold - Seated Hip Adduction Isometrics with Ball  - 1 x daily - 7 x weekly - 2 sets - 10 reps - 5 hold - Seated Gluteal Sets  - 1 x daily - 7 x weekly - 2 sets - 10 reps - 5 hold - Seated Heel Toe Raises  - 1 x daily - 7 x weekly - 2 sets - 10 reps - 5 hold LE strength and balance      PT Short Term Goals       PT SHORT TERM GOAL #1   Title Pt will be independent with initial HEP in order to improve strength and balance in order to decrease fall risk and improve function at home and work.    Baseline 02/23/2022- Patient reports not doing much as far as exercise in the home currently. 04/20/2022- Patient reports compliant with HEP and no questions at this time.; 9/7 pt indep   Time 6    Period Weeks    Status Goal met   Target Date 04/06/22              PT Long Term Goals       PT LONG TERM GOAL #1   Title Pt will be independent with final HEP in order to improve strength and balance in order to decrease fall risk and improve function at home and work. 04/20/2022- Patient reports compliant with HEP and no questions at this time.    Baseline 02/23/2022= No formal HEP in place. 9/7: pt performing at least once a day, feels comfortable   Time 12    Period Weeks    Status GOAL MET   Target Date 08/11/2022      PT LONG TERM GOAL #2   Title Pt will decrease 5TSTS by at least 8 seconds in order to demonstrate clinically significant improvement in LE strength.    Baseline 02/23/2022= 31.52 sec with B hands on knees; 04/20/2022= 16.08 sec with B hands on knees   Time 12    Period Days    Status Goal Met   Target Date 05/18/22      PT LONG TERM  GOAL #3   Title Pt will increase by at least 0.23 m/s in order to demonstrate clinically significant improvement in community ambulation.    Baseline 02/23/2022= 0.39 m/s using RW; 0.45 m/s; 9/7: 0.37 m/s with QC, 0.74 m/s with RW   Time 12  Period Weeks    Status GOAL MET   Target Date 05/18/22      PT LONG TERM GOAL #4   Title Pt will improve FOTO to target score of 50 to display perceived improvements in ability to complete ADL's.    Baseline 02/23/2022= 47; 9/7: 49; 11/30=54%   Time 12    Period Weeks    Status GOAL MET   Target Date 08/11/2022      PT LONG TERM GOAL #5   Title Pt will decrease TUG to below 20 seconds/decrease in order to demonstrate decreased fall risk.    Baseline 02/23/2022=28.27 sec with RW; 04/20/2022= 20.22 with SBQC; 9/7: 14.4 sec with RW, 18.5 sec with SBQC   Time 12    Period Weeks    Status MET   Target Date 05/18/22    PT LONG TERM GOAL #6  Title Pt will decrease TUG to 14 sec or below with SBQC in order to demonstrate decreased fall risk.   Baseline  9/7: 14.4 sec with RW, 18.5 sec with SBQC; 11/30=16.85 sec without an AD. 09/15/2022= 22.14 sec with SPC; 11/08/2022= 25.17 sec without and AD and 21.22 sec with SPC. 01/31/2023= 22.64 sec without a AD and 24.64 sec with SBQC;  03/09/2023= 22.04 sec with SBQC and 14.88 sec with 4WW.  05/02/2023= 20.46 sec with SBQC and 14.22 sec with 4WW; 06/21/2023= 16.47 sec with SBQC. 07/25/2023= 22.9 sec with SBQC  Time 12   Period Weeks   Status ONGOING  Target Date 10/17/2023   PT LONG TERM GOAL #7  Title Patient will increase six minute walk test distance to >700 ft for improved gait ability and increased ease with community participation.  Baseline 9/7: completes 4 minutes and 444 ft with RW. Test discontinued at 4 minutes due to fatgiue. 08/11/2022= patient ambulated 200 feet yet required 1 seated rest break- due to BLE fatigue- using SBQC: 09/15/2022= Patient ambulated 300 feet in 4 min 20 sec with 1 seated rest break  using SBQC. 11/08/2022 = 175 feet- stopped due to exhausted today- 2 min 10 sec; 01/17/2023= 200 feet in 2 min 38 sec; 02/02/2023= 160 feet with use of SBQC in 3 min 45 sec prior to stopping. 6/27 430 feet in 40 sec; 04/25/2023= 6 min complete - 500 feet using 4WW; 06/22/2023= 630 feet with 4WW; 07/25/2023= 460 feet in 4 min 17 sec.   Time 12   Period Weeks   Status ONGOING  Target Date 07/25/2023   PT LONG TERM GOAL #8  Title Pt will decrease 5TSTS by at least 2 seconds (without UE support)  in order to demonstrate clinically significant improvement in LE strength.   Baseline 08/11/2022= 16.33 sec with B hands on knees; 09/15/2022= 22 sec with B hands on knees (patient reports increased weakness since around Christmas and just has not bounced back); 11/08/2022= 18.85 without UE Support; 01/17/2023=30.0 sec without UE support- patient reported not having a good day but tried his best. 02/02/2023- 17.74 sec with UE Support; 03/09/2023 =15.82 sec without UE support; 04/25/2023= 15.77 sec; 06/21/2023= 17.09 sec without UE support. 07/25/2023= 16.35 sec without UE support  Time 12   Period Weeks  Status ONGOING  Target Date 10/17/2023        PT LONG TERM GOAL #9  Title Pt will improve BERG by at least 3 points in order to demonstrate clinically significant improvement in balance.   Baseline 11/08/2022= 33/56; 01/17/2023= 45/56; 02/02/2023=45/56; 05/02/2023=45/56;   Time 12   Period  Weeks  Status ONGOING  Target Date 07/25/2023       Plan -     Clinical Impression Statement Treatment was modified throughout session to accommodate patient fatigue level. He started off fatigued after 1st part of treatment in standing position so spent majority of remainder of visit performing some various seated therex with some level of resistance. He was more responsive and able to participate better with seated therex. He remains well motivated but physically very fatigued overall today. Patient will continue to benefit from  skilled PT services to address deficits and impairment identified in evaluation in order to maximize independence and safety in basic mobility required for performance of ADL, IADL, and leisure.    Personal Factors and Comorbidities Age;Past/Current Experience;Time since onset of injury/illness/exacerbation;Comorbidity 3+    Comorbidities COPD, History of Cancer, myasthenia gravis, chronic lumbar surgical history    Examination-Activity Limitations Lift;Squat;Bend;Stairs;Stand;Transfers;Probation officer;Bathing;Hygiene/Grooming;Dressing;Toileting;Bed Mobility;Caring for Others;Continence    Examination-Participation Restrictions Yard Work;Church;Cleaning;Driving;Community Activity    Stability/Clinical Decision Making Evolving/Moderate complexity    Rehab Potential Good    Clinical Impairments Affecting Rehab Potential multipe comorbidities    PT Frequency 1-2x / week    PT Duration 12 weeks    PT Treatment/Interventions ADLs/Self Care Home Management;Electrical Stimulation;Moist Heat;Traction;Ultrasound;Gait training;Stair training;Functional mobility training;Therapeutic activities;Therapeutic exercise;Balance training;Neuromuscular re-education;Patient/family education;Manual techniques;Passive range of motion;Dry needling;Joint Manipulations;Cryotherapy;Vestibular;Canalith Repostioning    PT Next Visit Plan Progress LE strengthening, balance training,  and functional endurance.   PT Home Exercise Plan standing strengthening and balance, seated strengthening and coordination.    Consulted and Agree with Plan of Care Patient            10:58 AM, 08/17/23     Lenda Kelp PT Physical Therapist - [facility]  Outpatient Physical Therapy- Main Campus [phone]     08/17/23, 10:58 AM

## 2023-08-22 ENCOUNTER — Ambulatory Visit: Payer: Medicare Other

## 2023-08-22 ENCOUNTER — Telehealth: Payer: Self-pay

## 2023-08-22 NOTE — Therapy (Incomplete)
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   Carotid stenosis    CHF (congestive heart failure) (HCC)    CKD (chronic kidney disease)    CKD (chronic kidney disease) stage 3, GFR 30-59 ml/min (HCC)    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   DDD (degenerative disc disease), cervical    Dysrhythmia    post carotid stent bradycardia; PAF 09/2020   GERD (gastroesophageal reflux disease)    Hypercholesterolemia    Hypertension    Hypothyroidism    pt takes Levothyroxine daily   Lumbosacral spinal stenosis    Myasthenia gravis, adult form (HCC)    PAD (peripheral artery disease) (HCC)    Parkinson disease (HCC)    Shortness of breath    Lung MD- Dr Joetta Manners   Sleep apnea    do not use CPAP every night   Stroke (HCC) 05/31/2023   Past Surgical History:  Procedure Laterality Date   ANTERIOR CERVICAL DECOMP/DISCECTOMY FUSION  07/18/2011   Procedure: ANTERIOR CERVICAL DECOMPRESSION/DISCECTOMY FUSION 2 LEVELS;  Surgeon: Kathaleen Maser Pool;  Location: [facility];  Service: Neurosurgery;  Laterality: N/A;  cervical five-six, cervical six-seven anterior cervical discectomy and fusion   ATRIAL FIBRILLATION ABLATION     BACK SURGERY     in 1985 [facility]   BILATERAL CARPAL TUNNEL RELEASE     01/2020 Right, 04/2020 Left   CARDIAC CATHETERIZATION     2005 at [facility], no stents   CAROTID PTA/STENT INTERVENTION N/A 09/17/2020   Procedure: CAROTID  PTA/STENT INTERVENTION;  Surgeon: Annice Needy, MD;  Location: [facility] INVASIVE CV LAB;  Service: Cardiovascular;  Laterality: N/A;   CATARACT EXTRACTION W/PHACO Left 01/06/2020   Procedure: CATARACT EXTRACTION PHACO AND INTRAOCULAR LENS PLACEMENT (IOC) ISTENT INJ LEFT 3.81  00:33.3;  Surgeon: Nevada Crane, MD;  Location: [facility];  Service: Ophthalmology;  Laterality: Left;   CATARACT EXTRACTION W/PHACO Right 02/03/2020   Procedure: CATARACT EXTRACTION PHACO AND INTRAOCULAR LENS PLACEMENT (IOC) RIGHT ISTENT INJ;  Surgeon: Nevada Crane, MD;  Location: [facility];  Service: Ophthalmology;  Laterality: Right;  4.29 0:35.6   COLONOSCOPY     HERNIA REPAIR Left    inguinal hernia repair in 1985   LUMBAR LAMINECTOMY/DECOMPRESSION MICRODISCECTOMY Left 02/24/2014   Procedure: LUMBAR LAMINECTOMY/DECOMPRESSION MICRODISCECTOMY LUMBAR THREE-FOUR, FOUR-FIVE, LEFT FIVE-SACRAL ONE ;  Surgeon: Temple Pacini, MD;  Location: [facility];  Service: Neurosurgery;  Laterality: Left;  LUMBAR LAMINECTOMY/DECOMPRESSION MICRODISCECTOMY LUMBAR THREE-FOUR, FOUR-FIVE, LEFT FIVE-SACRAL ONE    LUMBAR LAMINECTOMY/DECOMPRESSION MICRODISCECTOMY N/A 05/03/2021   Procedure: Laminectomy and Foraminotomy - L2-L3;  Surgeon: Julio Sicks, MD;  Location: [facility] OR;  Service: Neurosurgery;  Laterality: N/A;  3C   POSTERIOR CERVICAL FUSION/FORAMINOTOMY N/A 08/07/2020   Procedure: C3-6 POSTERIOR FUSION WITH DECOMPRESSION;  Surgeon: Venetia Night, MD;  Location: [facility] ORS;  Service: Neurosurgery;  Laterality: N/A;   PROSTATECTOMY  04/2013   [facility] Dr Joycelyn Das    Patient Active Problem List   Diagnosis Date Noted   Stroke [facility]) 05/31/2023   Obesity (BMI 30-39.9) 05/31/2023   HLD (hyperlipidemia) 05/31/2023   Chronic diastolic CHF (congestive heart failure) (HCC) 05/31/2023   Hyperkalemia 05/31/2023   SVT (supraventricular tachycardia) (HCC) 12/30/2021   Hypothyroidism 12/30/2021   Parkinson's disease  (HCC) 12/30/2021   Elevated troponin 12/30/2021   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   Acquired thrombophilia (HCC) 01/13/2021   History of decompression of median nerve 01/01/2021   Paroxysmal atrial fibrillation (HCC) 10/06/2020   Carotid stenosis, right 09/17/2020   S/P cervical spinal fusion    Leukocytosis    Essential hypertension    Anemia of chronic disease    Postoperative pain    Neuropathic pain    Cervical myelopathy (HCC) 08/07/2020   Preop cardiovascular exam 07/17/2020   SOB (shortness of breath) on exertion 07/17/2020   Leg weakness, bilateral 07/13/2020   Aortic atherosclerosis (HCC) 07/09/2020   Body mass index (BMI) 34.0-34.9, adult 12/25/2019   Myalgia 10/30/2019   Lumbar post-laminectomy syndrome 10/24/2019   PAD (peripheral artery disease) (HCC) 06/06/2019   CKD (chronic kidney disease) stage 3, GFR 30-59 ml/min (HCC) 04/29/2019   B12 deficiency 01/23/2019   Left arm numbness 02/28/2018   Neck pain 02/28/2018   Anemia 02/02/2017   DDD (degenerative disc disease), cervical 02/02/2017   Hypothyroid 02/02/2017   MRSA (methicillin resistant staph aureus) culture positive 02/02/2017   Nocturnal hypoxia 02/02/2017   Senile purpura (HCC) 10/03/2016   Essential hypertension, benign 09/16/2016   Bilateral carotid artery disease (HCC) 09/16/2016   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/18/2016   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  Rationale for Evaluation and Treatment Rehabilitation  PERTINENT HISTORY: [name] is a 77 year old male referred to [facility] neuro for difficulty with balance. He was also just recently dx in May 2023 with Impingement syndrome of left shoulder (M75.42) Impingement syndrome of left shoulder (primary encounter diagnosis) Left shoulder pain, unspecified chronicity Nontraumatic rupture of left proximal biceps tendon Past medical hx includes Lumbar laminectomy 04/2021. C3-6 decompression, fusion on 11/26, myasthenia gravis, 2012 ACDF 5/6, 6/7; OSA, hypothyroidism, HTN, COPD, multiple back surgeries, 2021 carpal tunnel release.  PRECAUTIONS: Fall  SUBJECTIVE: *** Patient reports having a rough week. Reports trying to do more around the house since his wife is still recovering from surgery. He states he is not at his best this week.      PAIN:  Are you having pain? no   TODAY'S TREATMENT:    08/22/23    NMR:   Forward step over 4 (1/2 foam) in // bars with minimal UE support Side step over 4 (1/2 foam) in // bars with minimal UE support Near tandem static stand Step tap onto 1st step x 20 reps alt LE   Therex:  Supine:  -Knee  flex -Hip abd -Straight leg raise - Resistive gait 2.5 # AW using 4WW        Pt educated throughout session about proper posture and technique with exercises. Improved exercise technique, movement at target joints, use of target muscles after min to mod verbal, visual, tactile cues.   PATIENT EDUCATION: Education details: Goals, plan, exercise technique, body mechanics, energy conservation techniques  Person educated: Patient Education method: Explanation, Demonstration, Tactile cues, Verbal cues, and Handouts Education comprehension: verbalized understanding, returned demonstration, verbal cues required, and tactile cues required   HOME EXERCISE PROGRAM:  No Updates  today  Access Code: [email or web address] URL: [email or web address] Date: 03/01/2022 Prepared by: Precious Bard  Exercises - Seated March  - 1 x daily - 7 x weekly - 2 sets - 10 reps - 5 hold - Seated Long Arc Quad  - 1 x daily - 7 x weekly - 2 sets - 10 reps - 5 hold - Seated Hip Abduction  - 1 x daily - 7 x weekly - 2 sets - 10 reps - 5 hold - Seated Hip Adduction Isometrics with Ball  - 1 x daily - 7 x weekly - 2 sets - 10 reps - 5 hold - Seated Gluteal Sets  - 1 x daily - 7 x weekly - 2 sets - 10 reps - 5 hold - Seated Heel Toe Raises  - 1 x daily - 7 x weekly - 2 sets - 10 reps - 5 hold LE strength and balance      PT Short Term Goals       PT SHORT TERM GOAL #1   Title Pt will be independent with initial HEP in order to improve strength and balance in order to decrease fall risk and improve function at home and work.    Baseline 02/23/2022- Patient reports not doing much as far as exercise in the home currently. 04/20/2022- Patient reports compliant with HEP and no questions at this time.; 9/7 pt indep   Time 6    Period Weeks    Status Goal met   Target Date 04/06/22              PT Long Term Goals       PT LONG TERM GOAL #1   Title Pt will be independent with final HEP in order to improve strength and balance in order to decrease fall risk and improve function at home and work. 04/20/2022- Patient reports compliant with HEP and no questions at this time.    Baseline 02/23/2022= No formal HEP in place. 9/7: pt performing at least once a day, feels comfortable   Time 12    Period Weeks    Status GOAL MET   Target Date 08/11/2022      PT LONG TERM GOAL #2   Title Pt will decrease 5TSTS by at least 8 seconds in order to demonstrate clinically significant improvement in LE strength.    Baseline 02/23/2022= 31.52 sec with B hands on knees; 04/20/2022= 16.08 sec with B hands on knees   Time 12    Period Days    Status Goal Met   Target Date 05/18/22      PT LONG TERM  GOAL #3   Title Pt will increase by at least 0.23 m/s in order to demonstrate clinically significant improvement in community ambulation.    Baseline 02/23/2022= 0.39 m/s using RW; 0.45 m/s; 9/7: 0.37 m/s with QC, 0.74 m/s with RW   Time 12  Period Weeks    Status GOAL MET   Target Date 05/18/22      PT LONG TERM GOAL #4   Title Pt will improve FOTO to target score of 50 to display perceived improvements in ability to complete ADL's.    Baseline 02/23/2022= 47; 9/7: 49; 11/30=54%   Time 12    Period Weeks    Status GOAL MET   Target Date 08/11/2022      PT LONG TERM GOAL #5   Title Pt will decrease TUG to below 20 seconds/decrease in order to demonstrate decreased fall risk.    Baseline 02/23/2022=28.27 sec with RW; 04/20/2022= 20.22 with SBQC; 9/7: 14.4 sec with RW, 18.5 sec with SBQC   Time 12    Period Weeks    Status MET   Target Date 05/18/22    PT LONG TERM GOAL #6  Title Pt will decrease TUG to 14 sec or below with SBQC in order to demonstrate decreased fall risk.   Baseline  9/7: 14.4 sec with RW, 18.5 sec with SBQC; 11/30=16.85 sec without an AD. 09/15/2022= 22.14 sec with SPC; 11/08/2022= 25.17 sec without and AD and 21.22 sec with SPC. 01/31/2023= 22.64 sec without a AD and 24.64 sec with SBQC;  03/09/2023= 22.04 sec with SBQC and 14.88 sec with 4WW.  05/02/2023= 20.46 sec with SBQC and 14.22 sec with 4WW; 06/21/2023= 16.47 sec with SBQC. 07/25/2023= 22.9 sec with SBQC  Time 12   Period Weeks   Status ONGOING  Target Date 10/17/2023   PT LONG TERM GOAL #7  Title Patient will increase six minute walk test distance to >700 ft for improved gait ability and increased ease with community participation.  Baseline 9/7: completes 4 minutes and 444 ft with RW. Test discontinued at 4 minutes due to fatgiue. 08/11/2022= patient ambulated 200 feet yet required 1 seated rest break- due to BLE fatigue- using SBQC: 09/15/2022= Patient ambulated 300 feet in 4 min 20 sec with 1 seated rest break  using SBQC. 11/08/2022 = 175 feet- stopped due to exhausted today- 2 min 10 sec; 01/17/2023= 200 feet in 2 min 38 sec; 02/02/2023= 160 feet with use of SBQC in 3 min 45 sec prior to stopping. 6/27 430 feet in 40 sec; 04/25/2023= 6 min complete - 500 feet using 4WW; 06/22/2023= 630 feet with 4WW; 07/25/2023= 460 feet in 4 min 17 sec.   Time 12   Period Weeks   Status ONGOING  Target Date 07/25/2023   PT LONG TERM GOAL #8  Title Pt will decrease 5TSTS by at least 2 seconds (without UE support)  in order to demonstrate clinically significant improvement in LE strength.   Baseline 08/11/2022= 16.33 sec with B hands on knees; 09/15/2022= 22 sec with B hands on knees (patient reports increased weakness since around Christmas and just has not bounced back); 11/08/2022= 18.85 without UE Support; 01/17/2023=30.0 sec without UE support- patient reported not having a good day but tried his best. 02/02/2023- 17.74 sec with UE Support; 03/09/2023 =15.82 sec without UE support; 04/25/2023= 15.77 sec; 06/21/2023= 17.09 sec without UE support. 07/25/2023= 16.35 sec without UE support  Time 12   Period Weeks  Status ONGOING  Target Date 10/17/2023        PT LONG TERM GOAL #9  Title Pt will improve BERG by at least 3 points in order to demonstrate clinically significant improvement in balance.   Baseline 11/08/2022= 33/56; 01/17/2023= 45/56; 02/02/2023=45/56; 05/02/2023=45/56;   Time 12   Period  Weeks  Status ONGOING  Target Date 07/25/2023       Plan -     Clinical Impression Statement Treatment was modified throughout session to accommodate patient fatigue level. He started off fatigued after 1st part of treatment in standing position so spent majority of remainder of visit performing some various seated therex with some level of resistance. He was more responsive and able to participate better with seated therex. He remains well motivated but physically very fatigued overall today. Patient will continue to benefit from  skilled PT services to address deficits and impairment identified in evaluation in order to maximize independence and safety in basic mobility required for performance of ADL, IADL, and leisure.    Personal Factors and Comorbidities Age;Past/Current Experience;Time since onset of injury/illness/exacerbation;Comorbidity 3+    Comorbidities COPD, History of Cancer, myasthenia gravis, chronic lumbar surgical history    Examination-Activity Limitations Lift;Squat;Bend;Stairs;Stand;Transfers;Probation officer;Bathing;Hygiene/Grooming;Dressing;Toileting;Bed Mobility;Caring for Others;Continence    Examination-Participation Restrictions Yard Work;Church;Cleaning;Driving;Community Activity    Stability/Clinical Decision Making Evolving/Moderate complexity    Rehab Potential Good    Clinical Impairments Affecting Rehab Potential multipe comorbidities    PT Frequency 1-2x / week    PT Duration 12 weeks    PT Treatment/Interventions ADLs/Self Care Home Management;Electrical Stimulation;Moist Heat;Traction;Ultrasound;Gait training;Stair training;Functional mobility training;Therapeutic activities;Therapeutic exercise;Balance training;Neuromuscular re-education;Patient/family education;Manual techniques;Passive range of motion;Dry needling;Joint Manipulations;Cryotherapy;Vestibular;Canalith Repostioning    PT Next Visit Plan Progress LE strengthening, balance training,  and functional endurance.   PT Home Exercise Plan standing strengthening and balance, seated strengthening and coordination.    Consulted and Agree with Plan of Care Patient            1:56 PM, 08/22/23     Lenda Kelp PT Physical Therapist - [facility]  Outpatient Physical Therapy- Main Campus [phone]     08/22/23, 1:56 PM

## 2023-08-22 NOTE — Telephone Encounter (Signed)
Patient Name: Francisco [name] MRN: [id number] DOB:Dec 22, 1945, 77 y.o., male Today's Date: 08/22/2023  Pt contacted via telephone on 12/10  and author left voice mail informing of missed appointment and informed pt of future PT appointment date and time.   Louis Meckel, PT    Lenda Kelp, PT 08/22/2023, 2:28 PM

## 2023-08-24 ENCOUNTER — Other Ambulatory Visit (INDEPENDENT_AMBULATORY_CARE_PROVIDER_SITE_OTHER): Payer: Self-pay | Admitting: Nurse Practitioner

## 2023-08-24 ENCOUNTER — Ambulatory Visit: Payer: Medicare Other

## 2023-08-24 NOTE — Therapy (Incomplete)
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   Carotid stenosis    CHF (congestive heart failure) (HCC)    CKD (chronic kidney disease)    CKD (chronic kidney disease) stage 3, GFR 30-59 ml/min (HCC)    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   DDD (degenerative disc disease), cervical    Dysrhythmia    post carotid stent bradycardia; PAF 09/2020   GERD (gastroesophageal reflux disease)    Hypercholesterolemia    Hypertension    Hypothyroidism    pt takes Levothyroxine daily   Lumbosacral spinal stenosis    Myasthenia gravis, adult form (HCC)    PAD (peripheral artery disease) (HCC)    Parkinson disease (HCC)    Shortness of breath    Lung MD- Dr Joetta Manners   Sleep apnea    do not use CPAP every night   Stroke (HCC) 05/31/2023   Past Surgical History:  Procedure Laterality Date   ANTERIOR CERVICAL DECOMP/DISCECTOMY FUSION  07/18/2011   Procedure: ANTERIOR CERVICAL DECOMPRESSION/DISCECTOMY FUSION 2 LEVELS;  Surgeon: Kathaleen Maser Pool;  Location: [facility];  Service: Neurosurgery;  Laterality: N/A;  cervical five-six, cervical six-seven anterior cervical discectomy and fusion   ATRIAL FIBRILLATION ABLATION     BACK SURGERY     in 1985 [facility]   BILATERAL CARPAL TUNNEL RELEASE     01/2020 Right, 04/2020 Left   CARDIAC CATHETERIZATION     2005 at [facility], no stents   CAROTID PTA/STENT INTERVENTION N/A 09/17/2020   Procedure: CAROTID  PTA/STENT INTERVENTION;  Surgeon: Annice Needy, MD;  Location: [facility] INVASIVE CV LAB;  Service: Cardiovascular;  Laterality: N/A;   CATARACT EXTRACTION W/PHACO Left 01/06/2020   Procedure: CATARACT EXTRACTION PHACO AND INTRAOCULAR LENS PLACEMENT (IOC) ISTENT INJ LEFT 3.81  00:33.3;  Surgeon: Nevada Crane, MD;  Location: [facility];  Service: Ophthalmology;  Laterality: Left;   CATARACT EXTRACTION W/PHACO Right 02/03/2020   Procedure: CATARACT EXTRACTION PHACO AND INTRAOCULAR LENS PLACEMENT (IOC) RIGHT ISTENT INJ;  Surgeon: Nevada Crane, MD;  Location: [facility];  Service: Ophthalmology;  Laterality: Right;  4.29 0:35.6   COLONOSCOPY     HERNIA REPAIR Left    inguinal hernia repair in 1985   LUMBAR LAMINECTOMY/DECOMPRESSION MICRODISCECTOMY Left 02/24/2014   Procedure: LUMBAR LAMINECTOMY/DECOMPRESSION MICRODISCECTOMY LUMBAR THREE-FOUR, FOUR-FIVE, LEFT FIVE-SACRAL ONE ;  Surgeon: Temple Pacini, MD;  Location: [facility];  Service: Neurosurgery;  Laterality: Left;  LUMBAR LAMINECTOMY/DECOMPRESSION MICRODISCECTOMY LUMBAR THREE-FOUR, FOUR-FIVE, LEFT FIVE-SACRAL ONE    LUMBAR LAMINECTOMY/DECOMPRESSION MICRODISCECTOMY N/A 05/03/2021   Procedure: Laminectomy and Foraminotomy - L2-L3;  Surgeon: Julio Sicks, MD;  Location: [facility] OR;  Service: Neurosurgery;  Laterality: N/A;  3C   POSTERIOR CERVICAL FUSION/FORAMINOTOMY N/A 08/07/2020   Procedure: C3-6 POSTERIOR FUSION WITH DECOMPRESSION;  Surgeon: Venetia Night, MD;  Location: [facility] ORS;  Service: Neurosurgery;  Laterality: N/A;   PROSTATECTOMY  04/2013   [facility] Dr Joycelyn Das    Patient Active Problem List   Diagnosis Date Noted   Stroke [facility]) 05/31/2023   Obesity (BMI 30-39.9) 05/31/2023   HLD (hyperlipidemia) 05/31/2023   Chronic diastolic CHF (congestive heart failure) (HCC) 05/31/2023   Hyperkalemia 05/31/2023   SVT (supraventricular tachycardia) (HCC) 12/30/2021   Hypothyroidism 12/30/2021   Parkinson's disease  (HCC) 12/30/2021   Elevated troponin 12/30/2021   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   Acquired thrombophilia (HCC) 01/13/2021   History of decompression of median nerve 01/01/2021   Paroxysmal atrial fibrillation (HCC) 10/06/2020   Carotid stenosis, right 09/17/2020   S/P cervical spinal fusion    Leukocytosis    Essential hypertension    Anemia of chronic disease    Postoperative pain    Neuropathic pain    Cervical myelopathy (HCC) 08/07/2020   Preop cardiovascular exam 07/17/2020   SOB (shortness of breath) on exertion 07/17/2020   Leg weakness, bilateral 07/13/2020   Aortic atherosclerosis (HCC) 07/09/2020   Body mass index (BMI) 34.0-34.9, adult 12/25/2019   Myalgia 10/30/2019   Lumbar post-laminectomy syndrome 10/24/2019   PAD (peripheral artery disease) (HCC) 06/06/2019   CKD (chronic kidney disease) stage 3, GFR 30-59 ml/min (HCC) 04/29/2019   B12 deficiency 01/23/2019   Left arm numbness 02/28/2018   Neck pain 02/28/2018   Anemia 02/02/2017   DDD (degenerative disc disease), cervical 02/02/2017   Hypothyroid 02/02/2017   MRSA (methicillin resistant staph aureus) culture positive 02/02/2017   Nocturnal hypoxia 02/02/2017   Senile purpura (HCC) 10/03/2016   Essential hypertension, benign 09/16/2016   Bilateral carotid artery disease (HCC) 09/16/2016   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/18/2016   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  Rationale for Evaluation and Treatment Rehabilitation  PERTINENT HISTORY: Francisco [name] is a 77 year old male referred to [facility] neuro for difficulty with balance. He was also just recently dx in May 2023 with Impingement syndrome of left shoulder (M75.42) Impingement syndrome of left shoulder (primary encounter diagnosis) Left shoulder pain, unspecified chronicity Nontraumatic rupture of left proximal biceps tendon Past medical hx includes Lumbar laminectomy 04/2021. C3-6 decompression, fusion on 11/26, myasthenia gravis, 2012 ACDF 5/6, 6/7; OSA, hypothyroidism, HTN, COPD, multiple back surgeries, 2021 carpal tunnel release.  PRECAUTIONS: Fall  SUBJECTIVE: *** Patient reports having a rough week. Reports trying to do more around the house since his wife is still recovering from surgery. He states he is not at his best this week.      PAIN:  Are you having pain? no   TODAY'S TREATMENT:    08/24/23    NMR:   Forward step over 4 (1/2 foam) in // bars with minimal UE support Side step over 4 (1/2 foam) in // bars with minimal UE support Near tandem static stand Step tap onto 1st step x 20 reps alt LE   Therex:  Supine:  -Knee  flex -Hip abd -Straight leg raise - Resistive gait 2.5 # AW using 4WW        Pt educated throughout session about proper posture and technique with exercises. Improved exercise technique, movement at target joints, use of target muscles after min to mod verbal, visual, tactile cues.   PATIENT EDUCATION: Education details: Goals, plan, exercise technique, body mechanics, energy conservation techniques  Person educated: Patient Education method: Explanation, Demonstration, Tactile cues, Verbal cues, and Handouts Education comprehension: verbalized understanding, returned demonstration, verbal cues required, and tactile cues required   HOME EXERCISE PROGRAM:  No Updates  today  Access Code: [email or web address] URL: [email or web address] Date: 03/01/2022 Prepared by: Precious Bard  Exercises - Seated March  - 1 x daily - 7 x weekly - 2 sets - 10 reps - 5 hold - Seated Long Arc Quad  - 1 x daily - 7 x weekly - 2 sets - 10 reps - 5 hold - Seated Hip Abduction  - 1 x daily - 7 x weekly - 2 sets - 10 reps - 5 hold - Seated Hip Adduction Isometrics with Ball  - 1 x daily - 7 x weekly - 2 sets - 10 reps - 5 hold - Seated Gluteal Sets  - 1 x daily - 7 x weekly - 2 sets - 10 reps - 5 hold - Seated Heel Toe Raises  - 1 x daily - 7 x weekly - 2 sets - 10 reps - 5 hold LE strength and balance      PT Short Term Goals       PT SHORT TERM GOAL #1   Title Pt will be independent with initial HEP in order to improve strength and balance in order to decrease fall risk and improve function at home and work.    Baseline 02/23/2022- Patient reports not doing much as far as exercise in the home currently. 04/20/2022- Patient reports compliant with HEP and no questions at this time.; 9/7 pt indep   Time 6    Period Weeks    Status Goal met   Target Date 04/06/22              PT Long Term Goals       PT LONG TERM GOAL #1   Title Pt will be independent with final HEP in order to improve strength and balance in order to decrease fall risk and improve function at home and work. 04/20/2022- Patient reports compliant with HEP and no questions at this time.    Baseline 02/23/2022= No formal HEP in place. 9/7: pt performing at least once a day, feels comfortable   Time 12    Period Weeks    Status GOAL MET   Target Date 08/11/2022      PT LONG TERM GOAL #2   Title Pt will decrease 5TSTS by at least 8 seconds in order to demonstrate clinically significant improvement in LE strength.    Baseline 02/23/2022= 31.52 sec with B hands on knees; 04/20/2022= 16.08 sec with B hands on knees   Time 12    Period Days    Status Goal Met   Target Date 05/18/22      PT LONG TERM  GOAL #3   Title Pt will increase by at least 0.23 m/s in order to demonstrate clinically significant improvement in community ambulation.    Baseline 02/23/2022= 0.39 m/s using RW; 0.45 m/s; 9/7: 0.37 m/s with QC, 0.74 m/s with RW   Time 12  Period Weeks    Status GOAL MET   Target Date 05/18/22      PT LONG TERM GOAL #4   Title Pt will improve FOTO to target score of 50 to display perceived improvements in ability to complete ADL's.    Baseline 02/23/2022= 47; 9/7: 49; 11/30=54%   Time 12    Period Weeks    Status GOAL MET   Target Date 08/11/2022      PT LONG TERM GOAL #5   Title Pt will decrease TUG to below 20 seconds/decrease in order to demonstrate decreased fall risk.    Baseline 02/23/2022=28.27 sec with RW; 04/20/2022= 20.22 with SBQC; 9/7: 14.4 sec with RW, 18.5 sec with SBQC   Time 12    Period Weeks    Status MET   Target Date 05/18/22    PT LONG TERM GOAL #6  Title Pt will decrease TUG to 14 sec or below with SBQC in order to demonstrate decreased fall risk.   Baseline  9/7: 14.4 sec with RW, 18.5 sec with SBQC; 11/30=16.85 sec without an AD. 09/15/2022= 22.14 sec with SPC; 11/08/2022= 25.17 sec without and AD and 21.22 sec with SPC. 01/31/2023= 22.64 sec without a AD and 24.64 sec with SBQC;  03/09/2023= 22.04 sec with SBQC and 14.88 sec with 4WW.  05/02/2023= 20.46 sec with SBQC and 14.22 sec with 4WW; 06/21/2023= 16.47 sec with SBQC. 07/25/2023= 22.9 sec with SBQC  Time 12   Period Weeks   Status ONGOING  Target Date 10/17/2023   PT LONG TERM GOAL #7  Title Patient will increase six minute walk test distance to >700 ft for improved gait ability and increased ease with community participation.  Baseline 9/7: completes 4 minutes and 444 ft with RW. Test discontinued at 4 minutes due to fatgiue. 08/11/2022= patient ambulated 200 feet yet required 1 seated rest break- due to BLE fatigue- using SBQC: 09/15/2022= Patient ambulated 300 feet in 4 min 20 sec with 1 seated rest break  using SBQC. 11/08/2022 = 175 feet- stopped due to exhausted today- 2 min 10 sec; 01/17/2023= 200 feet in 2 min 38 sec; 02/02/2023= 160 feet with use of SBQC in 3 min 45 sec prior to stopping. 6/27 430 feet in 40 sec; 04/25/2023= 6 min complete - 500 feet using 4WW; 06/22/2023= 630 feet with 4WW; 07/25/2023= 460 feet in 4 min 17 sec.   Time 12   Period Weeks   Status ONGOING  Target Date 07/25/2023   PT LONG TERM GOAL #8  Title Pt will decrease 5TSTS by at least 2 seconds (without UE support)  in order to demonstrate clinically significant improvement in LE strength.   Baseline 08/11/2022= 16.33 sec with B hands on knees; 09/15/2022= 22 sec with B hands on knees (patient reports increased weakness since around Christmas and just has not bounced back); 11/08/2022= 18.85 without UE Support; 01/17/2023=30.0 sec without UE support- patient reported not having a good day but tried his best. 02/02/2023- 17.74 sec with UE Support; 03/09/2023 =15.82 sec without UE support; 04/25/2023= 15.77 sec; 06/21/2023= 17.09 sec without UE support. 07/25/2023= 16.35 sec without UE support  Time 12   Period Weeks  Status ONGOING  Target Date 10/17/2023        PT LONG TERM GOAL #9  Title Pt will improve BERG by at least 3 points in order to demonstrate clinically significant improvement in balance.   Baseline 11/08/2022= 33/56; 01/17/2023= 45/56; 02/02/2023=45/56; 05/02/2023=45/56;   Time 12   Period  Weeks  Status ONGOING  Target Date 07/25/2023       Plan -     Clinical Impression Statement Treatment was modified throughout session to accommodate patient fatigue level. He started off fatigued after 1st part of treatment in standing position so spent majority of remainder of visit performing some various seated therex with some level of resistance. He was more responsive and able to participate better with seated therex. He remains well motivated but physically very fatigued overall today. Patient will continue to benefit from  skilled PT services to address deficits and impairment identified in evaluation in order to maximize independence and safety in basic mobility required for performance of ADL, IADL, and leisure.    Personal Factors and Comorbidities Age;Past/Current Experience;Time since onset of injury/illness/exacerbation;Comorbidity 3+    Comorbidities COPD, History of Cancer, myasthenia gravis, chronic lumbar surgical history    Examination-Activity Limitations Lift;Squat;Bend;Stairs;Stand;Transfers;Probation officer;Bathing;Hygiene/Grooming;Dressing;Toileting;Bed Mobility;Caring for Others;Continence    Examination-Participation Restrictions Yard Work;Church;Cleaning;Driving;Community Activity    Stability/Clinical Decision Making Evolving/Moderate complexity    Rehab Potential Good    Clinical Impairments Affecting Rehab Potential multipe comorbidities    PT Frequency 1-2x / week    PT Duration 12 weeks    PT Treatment/Interventions ADLs/Self Care Home Management;Electrical Stimulation;Moist Heat;Traction;Ultrasound;Gait training;Stair training;Functional mobility training;Therapeutic activities;Therapeutic exercise;Balance training;Neuromuscular re-education;Patient/family education;Manual techniques;Passive range of motion;Dry needling;Joint Manipulations;Cryotherapy;Vestibular;Canalith Repostioning    PT Next Visit Plan Progress LE strengthening, balance training,  and functional endurance.   PT Home Exercise Plan standing strengthening and balance, seated strengthening and coordination.    Consulted and Agree with Plan of Care Patient            11:40 AM, 08/24/23     Lenda Kelp PT Physical Therapist - [facility]  Outpatient Physical Therapy- Main [facility] [phone]     08/24/23, 11:40 AM

## 2023-08-29 ENCOUNTER — Telehealth: Payer: Self-pay

## 2023-08-29 ENCOUNTER — Ambulatory Visit: Payer: Medicare Other

## 2023-08-29 NOTE — Telephone Encounter (Signed)
Patient Name: Francisco [name] MRN: [id number] DOB:[birth year], 77 y.o., male Today's Date: 08/29/2023  Pt contacted via telephone and Thereasa Parkin spoke with patient who stated he mixed up his days and would not be in today. He was informed of next scheduled visit on Thurs 08/30/2022 at 2 pm.       Lenda Kelp, PT 08/29/2023, 2:28 PM

## 2023-08-31 ENCOUNTER — Ambulatory Visit: Payer: Medicare Other

## 2023-08-31 DIAGNOSIS — R262 Difficulty in walking, not elsewhere classified: Secondary | ICD-10-CM

## 2023-08-31 DIAGNOSIS — R296 Repeated falls: Secondary | ICD-10-CM

## 2023-08-31 DIAGNOSIS — M6281 Muscle weakness (generalized): Secondary | ICD-10-CM

## 2023-08-31 DIAGNOSIS — R269 Unspecified abnormalities of gait and mobility: Secondary | ICD-10-CM

## 2023-08-31 DIAGNOSIS — R2681 Unsteadiness on feet: Secondary | ICD-10-CM

## 2023-08-31 DIAGNOSIS — R2689 Other abnormalities of gait and mobility: Secondary | ICD-10-CM

## 2023-08-31 DIAGNOSIS — G8929 Other chronic pain: Secondary | ICD-10-CM

## 2023-08-31 DIAGNOSIS — M545 Low back pain, unspecified: Secondary | ICD-10-CM

## 2023-08-31 NOTE — Therapy (Signed)
OUTPATIENT PHYSICAL THERAPY TREATMENT   Patient Name: Francisco [name] MRN: [id number] DOB:11/30/1945, 77 y.o., male Today's Date: 09/01/2023  PCP: Lynnea Ferrier, MD REFERRING PROVIDER: Venetia Night MD   PT End of Session - 08/31/23 1409     Visit Number 88    Number of Visits 109    Date for PT Re-Evaluation 10/17/23    Authorization Type [employer] Medicare    Authorization Time Period 02/02/23-04/27/23    Progress Note Due on Visit 90    PT Start Time 1400    PT Stop Time 1443    PT Time Calculation (min) 43 min    Equipment Utilized During Treatment Gait belt    Activity Tolerance Patient tolerated treatment well;Patient limited by fatigue;No increased pain    Behavior During Therapy WFL for tasks assessed/performed                           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   Carotid stenosis    CHF (congestive heart failure) (HCC)    CKD (chronic kidney disease)    CKD (chronic kidney disease) stage 3, GFR 30-59 ml/min (HCC)    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   DDD (degenerative disc disease), cervical    Dysrhythmia    post carotid stent bradycardia; PAF 09/2020   GERD (gastroesophageal reflux disease)    Hypercholesterolemia    Hypertension    Hypothyroidism    pt takes Levothyroxine daily   Lumbosacral spinal stenosis    Myasthenia gravis, adult form (HCC)    PAD (peripheral artery disease) (HCC)    Parkinson disease (HCC)    Shortness of breath    Lung MD- Dr Joetta Manners   Sleep apnea    do not use CPAP every night   Stroke (HCC) 05/31/2023   Past Surgical History:  Procedure Laterality Date   ANTERIOR CERVICAL  DECOMP/DISCECTOMY FUSION  07/18/2011   Procedure: ANTERIOR CERVICAL DECOMPRESSION/DISCECTOMY FUSION 2 LEVELS;  Surgeon: Kathaleen Maser Pool;  [facility]: [facility]UR[facility];  Service: Neurosurgery;  Laterality: N/A;  cervical five-six, cervical six-seven anterior cervical discectomy and fusion   ATRIAL FIBRILLATION ABLATION     BACK SURGERY     in 1985 [facility]   BILATERAL CARPAL TUNNEL RELEASE     01/2020 Right, 04/2020 Left   CARDIAC CATHETERIZATION     2005 at [facility], no stents   CAROTID PTA/STENT INTERVENTION N/A 09/17/2020   Procedure: CAROTID PTA/STENT INTERVENTION;  Surgeon: Annice Needy, MD;  Location: [facility] INVASIVE CV LAB;  Service: Cardiovascular;  Laterality: N/A;   CATARACT EXTRACTION W/PHACO Left 01/06/2020   Procedure: CATARACT EXTRACTION PHACO AND INTRAOCULAR LENS PLACEMENT (IOC) ISTENT INJ LEFT 3.81  00:33.3;  Surgeon: Nevada Crane, MD;  Location: [facility] SURGERY CNTR;  Service: Ophthalmology;  Laterality: Left;   CATARACT EXTRACTION W/PHACO Right 02/03/2020   Procedure: CATARACT EXTRACTION PHACO AND INTRAOCULAR LENS PLACEMENT (IOC) RIGHT ISTENT INJ;  Surgeon: Nevada Crane, MD;  Location: [facility] SURGERY CNTR;  Service: Ophthalmology;  Laterality: Right;  4.29 0:35.6   COLONOSCOPY  HERNIA REPAIR Left    inguinal hernia repair in 1985   LUMBAR LAMINECTOMY/DECOMPRESSION MICRODISCECTOMY Left 02/24/2014   Procedure: LUMBAR LAMINECTOMY/DECOMPRESSION MICRODISCECTOMY LUMBAR THREE-FOUR, FOUR-FIVE, LEFT FIVE-SACRAL ONE ;  Surgeon: Temple Pacini, MD;  Location: [facility]UR[facility];  Service: Neurosurgery;  Laterality: Left;  LUMBAR LAMINECTOMY/DECOMPRESSION MICRODISCECTOMY LUMBAR THREE-FOUR, FOUR-FIVE, LEFT FIVE-SACRAL ONE    LUMBAR LAMINECTOMY/DECOMPRESSION MICRODISCECTOMY N/A 05/03/2021   Procedure: Laminectomy and Foraminotomy - L2-L3;  Surgeon: Julio Sicks, MD;  Location: [facility] OR;  Service: Neurosurgery;  Laterality: N/A;  3C   POSTERIOR CERVICAL FUSION/FORAMINOTOMY N/A 08/07/2020   Procedure:  C3-6 POSTERIOR FUSION WITH DECOMPRESSION;  Surgeon: Venetia Night, MD;  Location: [facility] ORS;  Service: Neurosurgery;  Laterality: N/A;   PROSTATECTOMY  04/2013   [facility] Dr Joycelyn Das    Patient Active Problem List   Diagnosis Date Noted   Stroke [facility]) 05/31/2023   Obesity (BMI 30-39.9) 05/31/2023   HLD (hyperlipidemia) 05/31/2023   Chronic diastolic CHF (congestive heart failure) (HCC) 05/31/2023   Hyperkalemia 05/31/2023   SVT (supraventricular tachycardia) (HCC) 12/30/2021   Hypothyroidism 12/30/2021   Parkinson's disease (HCC) 12/30/2021   Elevated troponin 12/30/2021   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   Acquired thrombophilia (HCC) 01/13/2021   History of decompression of median nerve 01/01/2021   Paroxysmal atrial fibrillation (HCC) 10/06/2020   Carotid stenosis, right 09/17/2020   S/P cervical spinal fusion    Leukocytosis    Essential hypertension    Anemia of chronic disease    Postoperative pain    Neuropathic pain    Cervical myelopathy (HCC) 08/07/2020   Preop cardiovascular exam 07/17/2020   SOB (shortness of breath) on exertion 07/17/2020   Leg weakness, bilateral 07/13/2020   Aortic atherosclerosis (HCC) 07/09/2020   Body mass index (BMI) 34.0-34.9, adult 12/25/2019   Myalgia 10/30/2019   Lumbar post-laminectomy syndrome 10/24/2019   PAD (peripheral artery disease) (HCC) 06/06/2019   CKD (chronic kidney disease) stage 3, GFR 30-59 ml/min (HCC) 04/29/2019   B12 deficiency 01/23/2019   Left arm numbness 02/28/2018   Neck pain 02/28/2018   Anemia 02/02/2017   DDD (degenerative disc disease), cervical 02/02/2017   Hypothyroid 02/02/2017   MRSA (methicillin resistant staph aureus) culture positive 02/02/2017   Nocturnal hypoxia 02/02/2017   Senile purpura (HCC) 10/03/2016   Essential hypertension, benign 09/16/2016   Bilateral carotid artery disease (HCC) 09/16/2016   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/18/2016   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   History of prostate cancer 12/21/2015   Left carpal tunnel syndrome 11/11/2015   Kidney stone on left side 07/05/2015   Myasthenia gravis (HCC) 05/26/2015   Radiculitis 01/05/2015   Long-term use of high-risk medication 10/15/2014   Persistent cough 09/10/2014   Pure hypercholesterolemia 07/25/2014   Spinal stenosis, lumbar region, with neurogenic claudication 02/24/2014   Lumbosacral stenosis with neurogenic claudication 02/24/2014   COPD (chronic obstructive pulmonary disease) (HCC) 01/22/2014    REFERRING DIAG: balance disorder   THERAPY DIAG:  Difficulty in walking, not elsewhere classified  Abnormality of gait and mobility  Chronic left-sided low back pain with left-sided sciatica  Muscle weakness (generalized)  Repeated falls  Unsteadiness on feet  Other abnormalities of gait and mobility  Chronic bilateral low back pain, unspecified whether sciatica present  Rationale for Evaluation and Treatment Rehabilitation  PERTINENT HISTORY: Zeplin Gianelli is a 77 year old male referred to OPPT neuro for difficulty with balance. He was also just recently dx in May 2023 with Impingement syndrome of left  shoulder (M75.42) Impingement syndrome of left shoulder (primary encounter diagnosis) Left shoulder pain, unspecified chronicity Nontraumatic rupture of left proximal biceps tendon Past medical hx includes Lumbar laminectomy 04/2021. C3-6 decompression, fusion on 11/26, myasthenia gravis, 2012 ACDF 5/6, 6/7; OSA, hypothyroidism, HTN, COPD, multiple back surgeries, 2021 carpal tunnel release.  PRECAUTIONS: Fall  SUBJECTIVE: Patient reports trying to work on his exercises and walking at home but dealing with wife and her medical issues and trying to make sure she makes it to all her appointments. States she starts Chemo tomorrow in [place]      PAIN:  Are you having pain? no   TODAY'S TREATMENT:    09/01/23      Therex:  Seated -Knee  flex RTB 2 sets of 12 reps -Hip abd RTB 2 sets of 12 reps Hip flex RTB 2 sets of 12 reps   Gait  using  large 4WW- 5 rounds today ranging from 75 feet to 200 feet - approx 750 feet total - exhibiting short reciprocal steps and working on decreased UE Support.         Pt educated throughout session about proper posture and technique with exercises. Improved exercise technique, movement at target joints, use of target muscles after min to mod verbal, visual, tactile cues.   PATIENT EDUCATION: Education details: Goals, plan, exercise technique, body mechanics, energy conservation techniques  Person educated: Patient Education method: Explanation, Demonstration, Tactile cues, Verbal cues, and Handouts Education comprehension: verbalized understanding, returned demonstration, verbal cues required, and tactile cues required   HOME EXERCISE PROGRAM:  No Updates today  Access Code: [email or web address] URL: [email or web address] Date: 03/01/2022 Prepared by: Precious Bard  Exercises - Seated March  - 1 x daily - 7 x weekly - 2 sets - 10 reps - 5 hold - Seated Long Arc Quad  - 1 x daily - 7 x weekly - 2 sets - 10 reps - 5 hold - Seated Hip Abduction  - 1 x daily - 7 x weekly - 2 sets - 10 reps - 5 hold - Seated Hip Adduction Isometrics with Ball  - 1 x daily - 7 x weekly - 2 sets - 10 reps - 5 hold - Seated Gluteal Sets  - 1 x daily - 7 x weekly - 2 sets - 10 reps - 5 hold - Seated Heel Toe Raises  - 1 x daily - 7 x weekly - 2 sets - 10 reps - 5 hold LE strength and balance      PT Short Term Goals       PT SHORT TERM GOAL #1   Title Pt will be independent with initial HEP in order to improve strength and balance in order to decrease fall risk and improve function at home and work.    Baseline 02/23/2022- Patient reports not doing much as far as exercise in the home currently. 04/20/2022- Patient reports compliant with HEP and no questions at this time.; 9/7 pt indep   Time 6     Period Weeks    Status Goal met   Target Date 04/06/22              PT Long Term Goals       PT LONG TERM GOAL #1   Title Pt will be independent with final HEP in order to improve strength and balance in order to decrease fall risk and improve function at home and work. 04/20/2022- Patient reports compliant with HEP and no questions at this time.  Baseline 02/23/2022= No formal HEP in place. 9/7: pt performing at least once a day, feels comfortable   Time 12    Period Weeks    Status GOAL MET   Target Date 08/11/2022      PT LONG TERM GOAL #2   Title Pt will decrease 5TSTS by at least 8 seconds in order to demonstrate clinically significant improvement in LE strength.    Baseline 02/23/2022= 31.52 sec with B hands on knees; 04/20/2022= 16.08 sec with B hands on knees   Time 12    Period Days    Status Goal Met   Target Date 05/18/22      PT LONG TERM GOAL #3   Title Pt will increase by at least 0.23 m/s in order to demonstrate clinically significant improvement in community ambulation.    Baseline 02/23/2022= 0.39 m/s using RW; 0.45 m/s; 9/7: 0.37 m/s with QC, 0.74 m/s with RW   Time 12    Period Weeks    Status GOAL MET   Target Date 05/18/22      PT LONG TERM GOAL #4   Title Pt will improve FOTO to target score of 50 to display perceived improvements in ability to complete ADL's.    Baseline 02/23/2022= 47; 9/7: 49; 11/30=54%   Time 12    Period Weeks    Status GOAL MET   Target Date 08/11/2022      PT LONG TERM GOAL #5   Title Pt will decrease TUG to below 20 seconds/decrease in order to demonstrate decreased fall risk.    Baseline 02/23/2022=28.27 sec with RW; 04/20/2022= 20.22 with SBQC; 9/7: 14.4 sec with RW, 18.5 sec with SBQC   Time 12    Period Weeks    Status MET   Target Date 05/18/22    PT LONG TERM GOAL #6  Title Pt will decrease TUG to 14 sec or below with SBQC in order to demonstrate decreased fall risk.   Baseline  9/7: 14.4 sec with RW, 18.5 sec  with SBQC; 11/30=16.85 sec without an AD. 09/15/2022= 22.14 sec with SPC; 11/08/2022= 25.17 sec without and AD and 21.22 sec with SPC. 01/31/2023= 22.64 sec without a AD and 24.64 sec with SBQC;  03/09/2023= 22.04 sec with SBQC and 14.88 sec with 4WW.  05/02/2023= 20.46 sec with SBQC and 14.22 sec with 4WW; 06/21/2023= 16.47 sec with SBQC. 07/25/2023= 22.9 sec with SBQC  Time 12   Period Weeks   Status ONGOING  Target Date 10/17/2023   PT LONG TERM GOAL #7  Title Patient will increase six minute walk test distance to >700 ft for improved gait ability and increased ease with community participation.  Baseline 9/7: completes 4 minutes and 444 ft with RW. Test discontinued at 4 minutes due to fatgiue. 08/11/2022= patient ambulated 200 feet yet required 1 seated rest break- due to BLE fatigue- using SBQC: 09/15/2022= Patient ambulated 300 feet in 4 min 20 sec with 1 seated rest break using SBQC. 11/08/2022 = 175 feet- stopped due to exhausted today- 2 min 10 sec; 01/17/2023= 200 feet in 2 min 38 sec; 02/02/2023= 160 feet with use of SBQC in 3 min 45 sec prior to stopping. 6/27 430 feet in 40 sec; 04/25/2023= 6 min complete - 500 feet using 4WW; 06/22/2023= 630 feet with 4WW; 07/25/2023= 460 feet in 4 min 17 sec.   Time 12   Period Weeks   Status ONGOING  Target Date 07/25/2023   PT LONG TERM GOAL #  8  Title Pt will decrease 5TSTS by at least 2 seconds (without UE support)  in order to demonstrate clinically significant improvement in LE strength.   Baseline 08/11/2022= 16.33 sec with B hands on knees; 09/15/2022= 22 sec with B hands on knees (patient reports increased weakness since around Christmas and just has not bounced back); 11/08/2022= 18.85 without UE Support; 01/17/2023=30.0 sec without UE support- patient reported not having a good day but tried his best. 02/02/2023- 17.74 sec with UE Support; 03/09/2023 =15.82 sec without UE support; 04/25/2023= 15.77 sec; 06/21/2023= 17.09 sec without UE support. 07/25/2023=  16.35 sec without UE support  Time 12   Period Weeks  Status ONGOING  Target Date 10/17/2023        PT LONG TERM GOAL #9  Title Pt will improve BERG by at least 3 points in order to demonstrate clinically significant improvement in balance.   Baseline 11/08/2022= 33/56; 01/17/2023= 45/56; 02/02/2023=45/56; 05/02/2023=45/56;   Time 12   Period Weeks  Status ONGOING  Target Date 07/25/2023       Plan -     Clinical Impression Statement Patient presented with good motivation for today's session. He responded well to endurance based focus today- able to increase his overall gait distance with short seated rest breaks. He also seemed more symmetrical with LE strength with left LE not fatiguing as much as previous sessions. Plan to continue to focus on overall LE strength and endurance as appropriate in future sessions.  Patient will continue to benefit from skilled PT services to address deficits and impairment identified in evaluation in order to maximize independence and safety in basic mobility required for performance of ADL, IADL, and leisure.    Personal Factors and Comorbidities Age;Past/Current Experience;Time since onset of injury/illness/exacerbation;Comorbidity 3+    Comorbidities COPD, History of Cancer, myasthenia gravis, chronic lumbar surgical history    Examination-Activity Limitations Lift;Squat;Bend;Stairs;Stand;Transfers;Probation officer;Bathing;Hygiene/Grooming;Dressing;Toileting;Bed Mobility;Caring for Others;Continence    Examination-Participation Restrictions Yard Work;Church;Cleaning;Driving;Community Activity    Stability/Clinical Decision Making Evolving/Moderate complexity    Rehab Potential Good    Clinical Impairments Affecting Rehab Potential multipe comorbidities    PT Frequency 1-2x / week    PT Duration 12 weeks    PT Treatment/Interventions ADLs/Self Care Home Management;Electrical Stimulation;Moist Heat;Traction;Ultrasound;Gait training;Stair  training;Functional mobility training;Therapeutic activities;Therapeutic exercise;Balance training;Neuromuscular re-education;Patient/family education;Manual techniques;Passive range of motion;Dry needling;Joint Manipulations;Cryotherapy;Vestibular;Canalith Repostioning    PT Next Visit Plan Progress LE strengthening, balance training,  and functional endurance.   PT Home Exercise Plan standing strengthening and balance, seated strengthening and coordination.    Consulted and Agree with Plan of Care Patient            6:33 PM, 09/01/23     Lenda Kelp PT Physical Therapist - [facility]  Outpatient Physical Therapy- Main Campus [phone]     09/01/23, 6:33 PM

## 2023-09-04 ENCOUNTER — Ambulatory Visit: Payer: Medicare Other

## 2023-09-07 ENCOUNTER — Ambulatory Visit: Payer: Medicare Other

## 2023-09-12 ENCOUNTER — Ambulatory Visit: Payer: Medicare Other

## 2023-09-14 ENCOUNTER — Ambulatory Visit: Payer: Medicare Other

## 2023-09-19 ENCOUNTER — Ambulatory Visit: Payer: Medicare Other

## 2023-09-26 ENCOUNTER — Ambulatory Visit: Payer: Medicare Other

## 2023-09-28 ENCOUNTER — Ambulatory Visit: Payer: Medicare Other

## 2023-10-03 ENCOUNTER — Ambulatory Visit: Payer: Medicare Other

## 2023-10-05 ENCOUNTER — Ambulatory Visit: Payer: Medicare Other

## 2023-10-10 ENCOUNTER — Ambulatory Visit: Payer: Medicare Other

## 2023-10-12 ENCOUNTER — Ambulatory Visit: Payer: Medicare Other

## 2023-10-17 ENCOUNTER — Ambulatory Visit: Payer: Medicare Other

## 2023-10-19 ENCOUNTER — Ambulatory Visit: Payer: Medicare Other

## 2023-10-24 ENCOUNTER — Ambulatory Visit: Payer: Medicare Other

## 2023-10-26 ENCOUNTER — Ambulatory Visit: Payer: Medicare Other

## 2023-10-31 ENCOUNTER — Ambulatory Visit: Payer: Medicare Other

## 2023-11-02 ENCOUNTER — Ambulatory Visit: Payer: Medicare Other

## 2023-11-07 ENCOUNTER — Ambulatory Visit: Payer: Medicare Other

## 2023-11-09 ENCOUNTER — Ambulatory Visit: Payer: Medicare Other

## 2023-11-14 ENCOUNTER — Ambulatory Visit: Payer: Medicare Other

## 2023-11-14 ENCOUNTER — Other Ambulatory Visit: Payer: Self-pay | Admitting: Neurosurgery

## 2023-11-14 DIAGNOSIS — G959 Disease of spinal cord, unspecified: Secondary | ICD-10-CM

## 2023-11-14 DIAGNOSIS — M48062 Spinal stenosis, lumbar region with neurogenic claudication: Secondary | ICD-10-CM

## 2023-11-16 ENCOUNTER — Ambulatory Visit: Payer: Medicare Other

## 2023-11-21 ENCOUNTER — Ambulatory Visit: Payer: Medicare Other

## 2023-11-23 ENCOUNTER — Ambulatory Visit: Payer: Medicare Other

## 2023-11-28 ENCOUNTER — Ambulatory Visit: Payer: Medicare Other

## 2023-11-30 ENCOUNTER — Ambulatory Visit: Payer: Medicare Other

## 2023-12-05 ENCOUNTER — Ambulatory Visit: Payer: Medicare Other

## 2023-12-07 ENCOUNTER — Ambulatory Visit: Payer: Medicare Other

## 2023-12-12 ENCOUNTER — Ambulatory Visit: Payer: Medicare Other

## 2023-12-14 ENCOUNTER — Ambulatory Visit: Payer: Medicare Other

## 2023-12-19 ENCOUNTER — Ambulatory Visit: Payer: Medicare Other

## 2023-12-21 ENCOUNTER — Ambulatory Visit: Payer: Medicare Other

## 2023-12-26 ENCOUNTER — Ambulatory Visit: Payer: Medicare Other

## 2023-12-28 ENCOUNTER — Ambulatory Visit: Payer: Medicare Other

## 2024-01-02 ENCOUNTER — Ambulatory Visit: Payer: Medicare Other

## 2024-01-04 ENCOUNTER — Ambulatory Visit: Payer: Medicare Other

## 2024-01-09 ENCOUNTER — Ambulatory Visit: Payer: Medicare Other

## 2024-01-11 ENCOUNTER — Ambulatory Visit: Payer: Medicare Other

## 2024-01-16 ENCOUNTER — Ambulatory Visit: Payer: Medicare Other

## 2024-01-18 ENCOUNTER — Ambulatory Visit: Payer: Medicare Other

## 2024-01-23 ENCOUNTER — Ambulatory Visit: Payer: Medicare Other

## 2024-01-25 ENCOUNTER — Ambulatory Visit: Payer: Medicare Other

## 2024-01-30 ENCOUNTER — Ambulatory Visit: Payer: Medicare Other

## 2024-01-30 ENCOUNTER — Encounter (INDEPENDENT_AMBULATORY_CARE_PROVIDER_SITE_OTHER): Payer: Self-pay

## 2024-02-01 ENCOUNTER — Ambulatory Visit: Payer: Medicare Other

## 2024-02-06 ENCOUNTER — Ambulatory Visit: Payer: Medicare Other

## 2024-02-07 ENCOUNTER — Encounter: Payer: Self-pay | Admitting: Anesthesiology

## 2024-02-07 ENCOUNTER — Ambulatory Visit
Admission: RE | Admit: 2024-02-07 | Discharge: 2024-02-07 | Disposition: A | Discharge status: Home / Self Care | Attending: Cardiology | Admitting: Cardiology

## 2024-02-07 ENCOUNTER — Ambulatory Visit
Admission: RE | Admit: 2024-02-07 | Discharge: 2024-02-07 | Disposition: A | Discharge status: Home / Self Care | Source: Home / Self Care | Admitting: Cardiology

## 2024-02-07 ENCOUNTER — Encounter
Admission: RE | Disposition: A | Discharge status: Home / Self Care | Payer: Self-pay | Source: Home / Self Care | Attending: Cardiology

## 2024-02-07 DIAGNOSIS — I4819 Other persistent atrial fibrillation: Secondary | ICD-10-CM | POA: Diagnosis not present

## 2024-02-07 DIAGNOSIS — Z539 Procedure and treatment not carried out, unspecified reason: Secondary | ICD-10-CM | POA: Diagnosis not present

## 2024-02-07 DIAGNOSIS — I4891 Unspecified atrial fibrillation: Secondary | ICD-10-CM | POA: Diagnosis present

## 2024-02-07 SURGERY — ECHOCARDIOGRAM, TRANSESOPHAGEAL
Anesthesia: General

## 2024-02-07 MED ORDER — SODIUM CHLORIDE 0.9 % IV SOLN
INTRAVENOUS | Status: DC
Start: 2024-02-07 — End: 2024-02-07

## 2024-02-07 NOTE — Progress Notes (Signed)
 Pt ate fudge this morning. Procedure will be rescheduled.

## 2024-02-08 ENCOUNTER — Ambulatory Visit: Payer: Medicare Other

## 2024-02-09 ENCOUNTER — Other Ambulatory Visit (INDEPENDENT_AMBULATORY_CARE_PROVIDER_SITE_OTHER): Payer: Self-pay | Admitting: Nurse Practitioner

## 2024-02-13 ENCOUNTER — Ambulatory Visit: Payer: Medicare Other

## 2024-02-15 ENCOUNTER — Ambulatory Visit: Payer: Medicare Other

## 2024-02-20 ENCOUNTER — Ambulatory Visit: Payer: Medicare Other

## 2024-02-22 ENCOUNTER — Ambulatory Visit
Admission: RE | Admit: 2024-02-22 | Discharge: 2024-02-22 | Disposition: A | Discharge status: Home / Self Care | Source: Home / Self Care

## 2024-02-22 ENCOUNTER — Ambulatory Visit

## 2024-02-22 ENCOUNTER — Ambulatory Visit: Payer: Medicare Other

## 2024-02-22 ENCOUNTER — Other Ambulatory Visit: Payer: Self-pay

## 2024-02-22 ENCOUNTER — Ambulatory Visit: Admission: RE | Admit: 2024-02-22 | Source: Home / Self Care | Admitting: Cardiology

## 2024-02-22 ENCOUNTER — Encounter
Admission: RE | Disposition: A | Discharge status: Home / Self Care | Payer: Self-pay | Source: Home / Self Care | Attending: Cardiology

## 2024-02-22 ENCOUNTER — Ambulatory Visit
Admission: RE | Admit: 2024-02-22 | Discharge: 2024-02-22 | Disposition: A | Discharge status: Home / Self Care | Attending: Cardiology | Admitting: Cardiology

## 2024-02-22 DIAGNOSIS — I4719 Other supraventricular tachycardia: Secondary | ICD-10-CM | POA: Diagnosis not present

## 2024-02-22 DIAGNOSIS — I4819 Other persistent atrial fibrillation: Secondary | ICD-10-CM | POA: Diagnosis not present

## 2024-02-22 DIAGNOSIS — I083 Combined rheumatic disorders of mitral, aortic and tricuspid valves: Secondary | ICD-10-CM | POA: Insufficient documentation

## 2024-02-22 DIAGNOSIS — Z993 Dependence on wheelchair: Secondary | ICD-10-CM | POA: Diagnosis not present

## 2024-02-22 DIAGNOSIS — Z87891 Personal history of nicotine dependence: Secondary | ICD-10-CM | POA: Diagnosis not present

## 2024-02-22 DIAGNOSIS — I110 Hypertensive heart disease with heart failure: Secondary | ICD-10-CM | POA: Diagnosis not present

## 2024-02-22 DIAGNOSIS — I4891 Unspecified atrial fibrillation: Secondary | ICD-10-CM | POA: Diagnosis present

## 2024-02-22 DIAGNOSIS — I2729 Other secondary pulmonary hypertension: Secondary | ICD-10-CM | POA: Diagnosis not present

## 2024-02-22 DIAGNOSIS — I50810 Right heart failure, unspecified: Secondary | ICD-10-CM | POA: Insufficient documentation

## 2024-02-22 DIAGNOSIS — J449 Chronic obstructive pulmonary disease, unspecified: Secondary | ICD-10-CM | POA: Diagnosis not present

## 2024-02-22 DIAGNOSIS — G7000 Myasthenia gravis without (acute) exacerbation: Secondary | ICD-10-CM | POA: Insufficient documentation

## 2024-02-22 HISTORY — PX: CARDIOVERSION: SHX1299

## 2024-02-22 HISTORY — PX: TEE WITHOUT CARDIOVERSION: SHX5443

## 2024-02-22 SURGERY — ECHOCARDIOGRAM, TRANSESOPHAGEAL
Anesthesia: General

## 2024-02-22 MED ORDER — LIDOCAINE VISCOUS HCL 2 % MT SOLN
OROMUCOSAL | Status: AC
  Filled 2024-02-22: qty 15

## 2024-02-22 MED ORDER — BUTAMBEN-TETRACAINE-BENZOCAINE 2-2-14 % EX AERO
INHALATION_SPRAY | CUTANEOUS | Status: AC
  Filled 2024-02-22: qty 5

## 2024-02-22 MED ORDER — SODIUM CHLORIDE 0.9 % IV SOLN: INTRAVENOUS | Status: DC

## 2024-02-22 MED ORDER — PROPOFOL 10 MG/ML IV BOLUS
INTRAVENOUS | Status: DC | PRN
  Administered 2024-02-22 (×2): 10 mg via INTRAVENOUS
  Administered 2024-02-22: 40 mg via INTRAVENOUS
  Administered 2024-02-22 (×2): 10 mg via INTRAVENOUS

## 2024-02-22 MED ORDER — EPHEDRINE SULFATE (PRESSORS) 50 MG/ML IJ SOLN
INTRAMUSCULAR | Status: DC | PRN
  Administered 2024-02-22 (×2): 5 mg via INTRAVENOUS

## 2024-02-22 MED ORDER — BUTAMBEN-TETRACAINE-BENZOCAINE 2-2-14 % EX AERO
1.0000 | INHALATION_SPRAY | Freq: Once | CUTANEOUS | Status: AC
  Administered 2024-02-22: 1 via TOPICAL
  Filled 2024-02-22: qty 20

## 2024-02-22 NOTE — Anesthesia Preprocedure Evaluation (Signed)
 Anesthesia Evaluation  Patient identified by MRN, date of birth, ID band Patient awake    Reviewed: Allergy & Precautions, NPO status , Patient's Chart, lab work & pertinent test results  History of Anesthesia Complications Negative for: history of anesthetic complications  Airway Mallampati: II  TM Distance: >3 FB Neck ROM: Full    Dental  (+) Dental Advisory Given, Teeth Intact   Pulmonary neg pulmonary ROS, sleep apnea and Oxygen sleep apnea , COPD,  COPD inhaler, former smoker   Pulmonary exam normal        Cardiovascular hypertension, (-) angina + Peripheral Vascular Disease and +CHF  + dysrhythmias Atrial Fibrillation + Valvular Problems/Murmurs    Carotid US  - 40-59% left ICAS, 1-39% right ICAS    Neuro/Psych  Myasthenia gravis - on mestinon , minimal symptoms    Neuromuscular disease CVA  negative psych ROS   GI/Hepatic negative GI ROS, Neg liver ROS,GERD  Medicated and Controlled,,  Endo/Other  negative endocrine ROSHypothyroidism    Renal/GU Renal disease  negative genitourinary   Musculoskeletal   Abdominal   Peds  Hematology negative hematology ROS (+) Blood dyscrasia, anemia   Anesthesia Other Findings Past Medical History: No date: Arthritis     Comment:  lower left hip No date: Atrial fibrillation (HCC) No date: Atypical angina (HCC) No date: Bilateral hand numbness     Comment:  from back surgery No date: Bronchitis, chronic (HCC) No date: Cancer [facility])     Comment:  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; Merkel cell cancer, and Basal               cell cancer (twice; back and leg) 03/2016 No date: Carotid stenosis No date: CHF (congestive heart failure) (HCC) No date: CKD (chronic kidney disease) No date: CKD (chronic kidney disease) stage 3, GFR 30-59 ml/min (HCC) No date: COPD (chronic obstructive pulmonary disease) (HCC)     Comment:  stage 2 No date: DDD (degenerative disc disease), cervical No  date: Dysrhythmia     Comment:  post carotid stent bradycardia; PAF 09/2020 No date: GERD (gastroesophageal reflux disease) No date: Hypercholesterolemia No date: Hypertension No date: Hypothyroidism     Comment:  pt takes Levothyroxine  daily No date: Lumbosacral spinal stenosis No date: Myasthenia gravis, adult form (HCC) No date: PAD (peripheral artery disease) (HCC) No date: Parkinson disease (HCC) No date: Shortness of breath     Comment:  Lung MD- Dr Coolidge Dent No date: Sleep apnea     Comment:  do not use CPAP every night 05/31/2023: Stroke [facility])  Past Surgical History: 07/18/2011: ANTERIOR CERVICAL DECOMP/DISCECTOMY FUSION     Comment:  Procedure: ANTERIOR CERVICAL DECOMPRESSION/DISCECTOMY               FUSION 2 LEVELS;  Surgeon: Awilda Bogus Pool;  [facility]: [facility]               NEURO ORS;  Service: Neurosurgery;  Laterality: N/A;                cervical five-six, cervical six-seven anterior cervical               discectomy and fusion No date: ATRIAL FIBRILLATION ABLATION No date: BACK SURGERY     Comment:  in [facility] No date: BILATERAL CARPAL TUNNEL RELEASE     Comment:  01/2020 Right, 04/2020 Left No date: CARDIAC CATHETERIZATION     Comment:  2005 at [facility], no stents 09/17/2020: CAROTID PTA/STENT INTERVENTION; N/A     Comment:  Procedure: CAROTID PTA/STENT INTERVENTION;  Surgeon:               Celso College, MD;  Location: [facility] INVASIVE CV LAB;                Service: Cardiovascular;  Laterality: N/A; 01/06/2020: CATARACT EXTRACTION W/PHACO; Left     Comment:  Procedure: CATARACT EXTRACTION PHACO AND INTRAOCULAR               LENS PLACEMENT (IOC) ISTENT INJ LEFT 3.81  00:33.3;                Surgeon: Rosa College, MD;  Location: [facility]               SURGERY CNTR;  Service: Ophthalmology;  Laterality: Left; 02/03/2020: CATARACT EXTRACTION W/PHACO; Right     Comment:  Procedure: CATARACT EXTRACTION PHACO AND INTRAOCULAR               LENS PLACEMENT (IOC) RIGHT  ISTENT INJ;  Surgeon: Rosa College, MD;  Location: [facility] SURGERY CNTR;                Service: Ophthalmology;  Laterality: Right;  4.29 0:35.6 No date: COLONOSCOPY No date: HERNIA REPAIR; Left     Comment:  inguinal hernia repair in 1985 02/24/2014: LUMBAR LAMINECTOMY/DECOMPRESSION MICRODISCECTOMY; Left     Comment:  Procedure: LUMBAR LAMINECTOMY/DECOMPRESSION               MICRODISCECTOMY LUMBAR THREE-FOUR, FOUR-FIVE, LEFT               FIVE-SACRAL ONE ;  Surgeon: Baruch Bosch, MD;  [facility];  Service: Neurosurgery;  Laterality: Left;               LUMBAR LAMINECTOMY/DECOMPRESSION MICRODISCECTOMY LUMBAR               THREE-FOUR, FOUR-FIVE, LEFT FIVE-SACRAL ONE  05/03/2021: LUMBAR LAMINECTOMY/DECOMPRESSION MICRODISCECTOMY; N/A     Comment:  Procedure: Laminectomy and Foraminotomy - L2-L3;                Surgeon: Agustina Aldrich, MD;  Location: [facility] OR;  Service:               Neurosurgery;  Laterality: N/A;  3C 08/07/2020: POSTERIOR CERVICAL FUSION/FORAMINOTOMY; N/A     Comment:  Procedure: C3-6 POSTERIOR FUSION WITH DECOMPRESSION;                Surgeon: Jodeen Munch, MD;  Location: [facility] ORS;                Service: Neurosurgery;  Laterality: N/A; 04/2013: PROSTATECTOMY     Comment:  [facility] Dr Kathlene Paradise   BMI    Body Mass Index: 33.00 kg/m      Reproductive/Obstetrics negative OB ROS                             Anesthesia Physical Anesthesia Plan  ASA: 3  Anesthesia Plan: General   Post-op Pain Management: Minimal or no pain anticipated   Induction: Intravenous  PONV Risk Score and Plan: 3 and Propofol  infusion, TIVA and Ondansetron   Airway Management Planned: Nasal Cannula  Additional Equipment: None  Intra-op Plan:   Post-operative Plan:   Informed Consent:  I have reviewed the patients History and Physical, chart, labs and discussed the procedure including the risks, benefits and  alternatives for the proposed anesthesia with the patient or authorized representative who has indicated his/her understanding and acceptance.     Dental advisory given  Plan Discussed with: CRNA and Surgeon  Anesthesia Plan Comments: (Discussed risks of anesthesia with patient, including possibility of difficulty with spontaneous ventilation under anesthesia necessitating airway intervention, PONV, and rare risks such as cardiac or respiratory or neurological events, and allergic reactions. Discussed the role of CRNA in patient's perioperative care. Patient understands.)       Anesthesia Quick Evaluation

## 2024-02-22 NOTE — Procedures (Signed)
 Electrical Cardioversion Procedure Note  Indication: Atrial fibrillation  Procedure Details: Consent: Indication, Risk/benefits of procedure as well as the alternatives explained to patient and informed consent obtained. Time out performed. Verified patient identification, verified procedure, verified correct patient position, special equipment/implants available, medications/allergies/relevent history reviewed, required imaging and test results reviewed.  Deep sedation was provided by anesthesia with propofol . Patient was delivered with 200 Joules of electricity X 1 with success to Sinus bradycardia with PACs. Patient tolerated the procedure well. No immediate complication noted.   Successful cardioversion  Joetta Mustache, MD [facility] Cardiology- [facility]

## 2024-02-22 NOTE — Anesthesia Procedure Notes (Signed)
 Procedure Name: MAC Date/Time: 02/22/2024 12:20 PM  Performed by: Orin Birk, CRNAPre-anesthesia Checklist: Patient identified, Emergency Drugs available, Suction available and Patient being monitored Patient Re-evaluated:Patient Re-evaluated prior to induction Oxygen Delivery Method: Nasal cannula

## 2024-02-22 NOTE — H&P (Signed)
 H&P:  History of Present Illness: [name] is a 78 y.o.male patient who presented for management of atrial fibrillation.  Past medical history significant for paroxysmal atrial fibrillation status post ablation 2023, paroxysmal SVT, hypertension, hyperlipidemia, right carotid stent, COPD, moderate pulmonary hypertension, myasthenia gravis. Echocardiogram 01/2024 with normal biventricular systolic function, LVEF 55%, mild MR, severe TR with moderate pulmonary hypertension, RVSP 60 mmHg. Severe right atrial enlargement. 3-day Holter 12/2023 which showed predominantly atrial fibrillation with burden of 100% with 40% in RVR, average heart rate 99 bpm.  He is accompanied by his step son to visit today. He is predominantly wheelchair-bound with minimal ambulation with wheeled walker due to back pain issues with prior surgery. Has pedal edema. No increased shortness of breath or orthopnea. No chest pain, dizziness or syncope. He ate when he came for TEE/cardioversion which got canceled  Past Medical and Surgical History  Past Medical History Past Medical History:  Diagnosis Date  Allergic rhinitis  Anemia  Atypical chest pain  Myoview  ETT March 2007 with fixed inferior defect with preserved LV function. Echocardiogram without wall motion abnormalities, with left ventricular hypertrophy, mild pulmonary insufficiency. Previous cardiac catheterization revealing no significant abnormalities, per report. Thought to be secondary to reflux disease, responded to treatment for this. (BK)  B12 deficiency 01/23/2019  COPD (chronic obstructive pulmonary disease) (CMS/HHS-HCC)  DDD (degenerative disc disease), cervical  Essential hypertension 07/25/2014  History of back pain  with prior procedure, per patient  History of shingles  Hyperlipidemia  Hypertension  Hypothyroid  Injury of left rotator cuff  MRSA (methicillin resistant staph aureus) culture positive 07/2012  Myasthenia gravis (CMS/HHS-HCC)   evaluated by Ophthalmology. Evaluated by Neurology, diagnosed with ocular myasthenia gravis. Evaluated at [facility]. High dose prednisone  initiated.  Myasthenia gravis (CMS/HHS-HCC)  Nocturnal hypoxia  by oximetry 4/15; followed by Dr. Jamal Mays; on oxygen 2L at night  Prostate cancer (CMS/HHS-HCC) 02/2012  Elevated PSA 12/12; evaluated by Dr. Fredrick Jenkins; gleason 6  Pure hypercholesterolemia 07/25/2014   Past Surgical History He has a past surgical history that includes Laminectomy Lumbar Spine; prostatectomy; Posterior fusion cervical spine; Herniorrhaphy (1994); Heart catheterization (2006); ACDF C5-6, C6-7 (07/2011); Colonoscopy (05/08/2009); Colonoscopy (03/31/2014); egd (03/31/2014); Spine surgery; C3-6 POSTERIOR FUSION WITH DECOMPRESSION (08/07/2020); transcatheter placement stent open common carotid / innominate artery (Right, 09/15/2020); and ablation arrythmia focus (06/30/2022).   Medications and Allergies  Current Medications  Current Outpatient Medications  Medication Sig Dispense Refill  AMIOdarone (PACERONE) 200 MG tablet Take 1 tablet (200 mg total) by mouth once daily 30 tablet 11  azaTHIOprine  (IMURAN ) 50 mg tablet Take 3 tablets (150 mg total) by mouth once daily 270 tablet 3  bumetanide (BUMEX) 1 MG tablet Take 1 tablet (1 mg total) by mouth 2 (two) times daily 30 tablet 11  carbidopa -levodopa  (SINEMET ) 25-100 mg tablet Take 1.5 tablets by mouth 2 (two) times daily 270 tablet 3  CHOLECALCIFEROL , VITAMIN D3, ORAL Take by mouth once daily  ELIQUIS  5 mg tablet Take 1 tablet by mouth twice daily 180 tablet 0  HYDROcodone -acetaminophen  (NORCO) 10-325 mg tablet Take 1 tablet by mouth every 4 (four) hours as needed 1-2 Tabs Q4H PRN  ipratropium-albuteroL  (DUO-NEB) nebulizer solution USE 3 ML VIA NEBULIZER FOUR TIMES DAILY AS NEEDED FOR WHEEZING 1080 mL 0  levothyroxine  (SYNTHROID ) 50 MCG tablet Take 1 tablet (50 mcg total) by mouth once daily Take on an empty stomach with a glass of water  at least 30-60 minutes before breakfast. 60 tablet 3  meclizine (ANTIVERT) 25 mg tablet  TAKE 1 TABLET BY MOUTH THREE TIMES DAILY AS NEEDED FOR DIZZINESS 90 tablet 1  metoprolol  SUCCinate (TOPROL -XL) 25 MG XL tablet Take 25 mg by mouth once daily  metoprolol  TARTrate (LOPRESSOR ) 25 MG tablet Take 1 tablet (25 mg total) by mouth 2 (two) times daily 60 tablet 11  predniSONE  (DELTASONE ) 10 MG tablet Take 1 tablet (10 mg total) by mouth every Monday, Wednesday, and Friday 40 tablet 3  pregabalin  (LYRICA ) 50 MG capsule Take 1 capsule (50 mg total) by mouth 2 (two) times daily 60 capsule 3  spironolactone  (ALDACTONE ) 25 MG tablet Take 1 tablet (25 mg total) by mouth once daily 90 tablet 2  traZODone  (DESYREL ) 50 MG tablet TAKE 1 TABLET BY MOUTH AT BEDTIME AS NEEDED FOR SLEEP 90 tablet 1   No current facility-administered medications for this visit.   Allergies: Codeine sulfate and Zithromax [azithromycin]  Social and Family History  Social History reports that he quit smoking about 24 years ago. His smoking use included cigarettes. He started smoking about 63 years ago. He has a 29.5 pack-year smoking history. He has never used smokeless tobacco. He reports current alcohol use of about 0.8 standard drinks of alcohol per week. He reports that he does not use drugs.  Family History Family History  Problem Relation Name Age of Onset  High blood pressure (Hypertension) Mother  Stroke Mother  Alcohol abuse Father  Liver disease Father  Stroke Father  3 strokes  Cancer Brother  head and neck  Colon cancer Neg Hx  Colon polyps Neg Hx  Rectal cancer Neg Hx  Ulcers Neg Hx  Anesthesia problems Neg Hx   Review of Systems   Review of Systems:  Pedal edema  Physical Examination   Vitals:BP 122/80  Pulse 80  Ht 172.7 cm (5' 8)  Wt (!) 114.3 kg (252 lb)  SpO2 96%  BMI 38.32 kg/m  Ht:172.7 cm (5' 8) Wt:(!) 114.3 kg (252 lb) ZOX:WRUE surface area is 2.34 meters squared. Body mass index is  38.32 kg/m.  HEENT: Pupils equally reactive to light and accomodation  Neck: Supple, no significant JVD Lungs: clear to auscultation bilaterally; no wheezes, rales, rhonchi Heart: Regular rate and rhythm. No murmur Extremities: +1 to +2 pedal edema  Assessment and Plan   78 y.o. male with  Persistent atrial fibrillation Heart failure with preserved EF, predominantly right-sided heart failure History of A-fib ablation 2023 Severe tricuspid regurgitation Paroxysmal SVT Moderate pulmonary hypertension Hypertension, hyperlipidemia Myasthenia gravis, COPD  Plan to proceed with TEE/CV for management of atrial fibrillation. Continue current medications  Joetta Mustache, MD [facility]

## 2024-02-22 NOTE — Transfer of Care (Signed)
 Immediate Anesthesia Transfer of Care Note  Patient: Francisco [name]  Procedure(s) Performed: ECHOCARDIOGRAM, TRANSESOPHAGEAL CARDIOVERSION  Patient Location: PACU and special recoveries  Anesthesia Type:General  Level of Consciousness: drowsy and patient cooperative  Airway & Oxygen Therapy: Patient Spontanous Breathing and Patient connected to nasal cannula oxygen  Post-op Assessment: Report given to RN and Post -op Vital signs reviewed and stable  Post vital signs: Reviewed and stable  Last Vitals:  Vitals Value Taken Time  BP 120/68 02/22/24 12:48  Temp    Pulse 54 02/22/24 12:49  Resp 12 02/22/24 12:49  SpO2 99 % 02/22/24 12:49    Last Pain:  Vitals:   02/22/24 1120  TempSrc: Oral  PainSc: 0-No pain         Complications: No notable events documented.

## 2024-02-22 NOTE — Progress Notes (Signed)
*  PRELIMINARY RESULTS* Echocardiogram Echocardiogram Transesophageal has been performed.  Francisco [name] 02/22/2024, 12:43 PM

## 2024-02-23 ENCOUNTER — Encounter: Payer: Self-pay | Admitting: Cardiology

## 2024-02-23 LAB — ECHO TEE

## 2024-02-23 NOTE — Anesthesia Postprocedure Evaluation (Signed)
 Anesthesia Post Note  Patient: Francisco [name]  Procedure(s) Performed: ECHOCARDIOGRAM, TRANSESOPHAGEAL CARDIOVERSION  Patient location during evaluation: Specials Recovery Anesthesia Type: General Level of consciousness: awake and alert Pain management: pain level controlled Vital Signs Assessment: post-procedure vital signs reviewed and stable Respiratory status: spontaneous breathing, nonlabored ventilation, respiratory function stable and patient connected to nasal cannula oxygen Cardiovascular status: blood pressure returned to baseline and stable Postop Assessment: no apparent nausea or vomiting Anesthetic complications: no  No notable events documented.   Last Vitals:  Vitals:   02/22/24 1345 02/22/24 1400  BP: 111/64 114/76  Pulse: (!) 49 (!) 49  Resp: 13 18  Temp:  36.7 C  SpO2: 97% 95%    Last Pain:  Vitals:   02/22/24 1400  TempSrc: Oral  PainSc:                  Enrique Harvest

## 2024-02-26 ENCOUNTER — Inpatient Hospital Stay
Admission: EM | Admit: 2024-02-26 | Discharge: 2024-03-06 | DRG: 291 | Disposition: A | Discharge status: Skilled Nursing Facility | Attending: Internal Medicine | Admitting: Internal Medicine

## 2024-02-26 ENCOUNTER — Other Ambulatory Visit: Payer: Self-pay

## 2024-02-26 DIAGNOSIS — L0390 Cellulitis, unspecified: Secondary | ICD-10-CM | POA: Diagnosis present

## 2024-02-26 DIAGNOSIS — J4489 Other specified chronic obstructive pulmonary disease: Secondary | ICD-10-CM | POA: Diagnosis present

## 2024-02-26 DIAGNOSIS — G7000 Myasthenia gravis without (acute) exacerbation: Secondary | ICD-10-CM | POA: Diagnosis present

## 2024-02-26 DIAGNOSIS — E039 Hypothyroidism, unspecified: Secondary | ICD-10-CM | POA: Diagnosis present

## 2024-02-26 DIAGNOSIS — Z6834 Body mass index (BMI) 34.0-34.9, adult: Secondary | ICD-10-CM

## 2024-02-26 DIAGNOSIS — G20A1 Parkinson's disease without dyskinesia, without mention of fluctuations: Secondary | ICD-10-CM | POA: Diagnosis present

## 2024-02-26 DIAGNOSIS — Z87891 Personal history of nicotine dependence: Secondary | ICD-10-CM

## 2024-02-26 DIAGNOSIS — Z9079 Acquired absence of other genital organ(s): Secondary | ICD-10-CM

## 2024-02-26 DIAGNOSIS — L03119 Cellulitis of unspecified part of limb: Secondary | ICD-10-CM | POA: Diagnosis present

## 2024-02-26 DIAGNOSIS — I272 Pulmonary hypertension, unspecified: Secondary | ICD-10-CM | POA: Diagnosis present

## 2024-02-26 DIAGNOSIS — E669 Obesity, unspecified: Secondary | ICD-10-CM | POA: Diagnosis present

## 2024-02-26 DIAGNOSIS — Z981 Arthrodesis status: Secondary | ICD-10-CM

## 2024-02-26 DIAGNOSIS — I4819 Other persistent atrial fibrillation: Secondary | ICD-10-CM | POA: Diagnosis present

## 2024-02-26 DIAGNOSIS — I739 Peripheral vascular disease, unspecified: Secondary | ICD-10-CM | POA: Diagnosis present

## 2024-02-26 DIAGNOSIS — Z751 Person awaiting admission to adequate facility elsewhere: Secondary | ICD-10-CM

## 2024-02-26 DIAGNOSIS — Z79890 Hormone replacement therapy (postmenopausal): Secondary | ICD-10-CM

## 2024-02-26 DIAGNOSIS — G4730 Sleep apnea, unspecified: Secondary | ICD-10-CM | POA: Diagnosis present

## 2024-02-26 DIAGNOSIS — Z993 Dependence on wheelchair: Secondary | ICD-10-CM

## 2024-02-26 DIAGNOSIS — Z85828 Personal history of other malignant neoplasm of skin: Secondary | ICD-10-CM

## 2024-02-26 DIAGNOSIS — D539 Nutritional anemia, unspecified: Secondary | ICD-10-CM | POA: Diagnosis present

## 2024-02-26 DIAGNOSIS — M7989 Other specified soft tissue disorders: Secondary | ICD-10-CM | POA: Diagnosis present

## 2024-02-26 DIAGNOSIS — N281 Cyst of kidney, acquired: Secondary | ICD-10-CM | POA: Diagnosis present

## 2024-02-26 DIAGNOSIS — M5030 Other cervical disc degeneration, unspecified cervical region: Secondary | ICD-10-CM | POA: Diagnosis present

## 2024-02-26 DIAGNOSIS — Z8673 Personal history of transient ischemic attack (TIA), and cerebral infarction without residual deficits: Secondary | ICD-10-CM

## 2024-02-26 DIAGNOSIS — I5033 Acute on chronic diastolic (congestive) heart failure: Secondary | ICD-10-CM | POA: Diagnosis present

## 2024-02-26 DIAGNOSIS — Z7902 Long term (current) use of antithrombotics/antiplatelets: Secondary | ICD-10-CM

## 2024-02-26 DIAGNOSIS — L03116 Cellulitis of left lower limb: Secondary | ICD-10-CM | POA: Diagnosis present

## 2024-02-26 DIAGNOSIS — E7800 Pure hypercholesterolemia, unspecified: Secondary | ICD-10-CM | POA: Diagnosis present

## 2024-02-26 DIAGNOSIS — N179 Acute kidney failure, unspecified: Secondary | ICD-10-CM | POA: Diagnosis not present

## 2024-02-26 DIAGNOSIS — I509 Heart failure, unspecified: Principal | ICD-10-CM

## 2024-02-26 DIAGNOSIS — I130 Hypertensive heart and chronic kidney disease with heart failure and stage 1 through stage 4 chronic kidney disease, or unspecified chronic kidney disease: Secondary | ICD-10-CM | POA: Diagnosis not present

## 2024-02-26 DIAGNOSIS — Z7901 Long term (current) use of anticoagulants: Secondary | ICD-10-CM

## 2024-02-26 DIAGNOSIS — Z961 Presence of intraocular lens: Secondary | ICD-10-CM | POA: Diagnosis present

## 2024-02-26 DIAGNOSIS — I10 Essential (primary) hypertension: Secondary | ICD-10-CM | POA: Diagnosis present

## 2024-02-26 DIAGNOSIS — Z8249 Family history of ischemic heart disease and other diseases of the circulatory system: Secondary | ICD-10-CM

## 2024-02-26 DIAGNOSIS — T502X5A Adverse effect of carbonic-anhydrase inhibitors, benzothiadiazides and other diuretics, initial encounter: Secondary | ICD-10-CM | POA: Diagnosis present

## 2024-02-26 DIAGNOSIS — Z9842 Cataract extraction status, left eye: Secondary | ICD-10-CM

## 2024-02-26 DIAGNOSIS — Z823 Family history of stroke: Secondary | ICD-10-CM

## 2024-02-26 DIAGNOSIS — Z79899 Other long term (current) drug therapy: Secondary | ICD-10-CM

## 2024-02-26 DIAGNOSIS — I071 Rheumatic tricuspid insufficiency: Secondary | ICD-10-CM | POA: Diagnosis present

## 2024-02-26 DIAGNOSIS — L03115 Cellulitis of right lower limb: Secondary | ICD-10-CM | POA: Diagnosis present

## 2024-02-26 DIAGNOSIS — Z9841 Cataract extraction status, right eye: Secondary | ICD-10-CM

## 2024-02-26 DIAGNOSIS — N183 Chronic kidney disease, stage 3 unspecified: Secondary | ICD-10-CM | POA: Diagnosis present

## 2024-02-26 DIAGNOSIS — Z66 Do not resuscitate: Secondary | ICD-10-CM | POA: Diagnosis present

## 2024-02-26 DIAGNOSIS — K219 Gastro-esophageal reflux disease without esophagitis: Secondary | ICD-10-CM | POA: Diagnosis present

## 2024-02-26 DIAGNOSIS — Z91119 Patient's noncompliance with dietary regimen due to unspecified reason: Secondary | ICD-10-CM

## 2024-02-26 DIAGNOSIS — Z8546 Personal history of malignant neoplasm of prostate: Secondary | ICD-10-CM

## 2024-02-26 DIAGNOSIS — M4807 Spinal stenosis, lumbosacral region: Secondary | ICD-10-CM | POA: Diagnosis present

## 2024-02-26 DIAGNOSIS — I480 Paroxysmal atrial fibrillation: Secondary | ICD-10-CM | POA: Diagnosis present

## 2024-02-26 DIAGNOSIS — Z7952 Long term (current) use of systemic steroids: Secondary | ICD-10-CM

## 2024-02-26 LAB — CBC WITH DIFFERENTIAL/PLATELET
Abs Immature Granulocytes: 0.04 10*3/uL (ref 0.00–0.07)
Basophils Absolute: 0 10*3/uL (ref 0.0–0.1)
Basophils Relative: 0 %
Eosinophils Absolute: 0 10*3/uL (ref 0.0–0.5)
Eosinophils Relative: 0 %
HCT: 32.7 % — ABNORMAL LOW (ref 39.0–52.0)
Hemoglobin: 10 g/dL — ABNORMAL LOW (ref 13.0–17.0)
Immature Granulocytes: 1 %
Lymphocytes Relative: 5 %
Lymphs Abs: 0.2 10*3/uL — ABNORMAL LOW (ref 0.7–4.0)
MCH: 35.3 pg — ABNORMAL HIGH (ref 26.0–34.0)
MCHC: 30.6 g/dL (ref 30.0–36.0)
MCV: 115.5 fL — ABNORMAL HIGH (ref 80.0–100.0)
Monocytes Absolute: 0.3 10*3/uL (ref 0.1–1.0)
Monocytes Relative: 7 %
Neutro Abs: 3.5 10*3/uL (ref 1.7–7.7)
Neutrophils Relative %: 87 %
Platelets: 231 10*3/uL (ref 150–400)
RBC: 2.83 MIL/uL — ABNORMAL LOW (ref 4.22–5.81)
RDW: 16.4 % — ABNORMAL HIGH (ref 11.5–15.5)
Smear Review: NORMAL
WBC: 4 10*3/uL (ref 4.0–10.5)
nRBC: 0 % (ref 0.0–0.2)

## 2024-02-26 LAB — BASIC METABOLIC PANEL WITH GFR
Anion gap: 7 (ref 5–15)
BUN: 23 mg/dL (ref 8–23)
CO2: 33 mmol/L — ABNORMAL HIGH (ref 22–32)
Calcium: 8.7 mg/dL — ABNORMAL LOW (ref 8.9–10.3)
Chloride: 105 mmol/L (ref 98–111)
Creatinine, Ser: 1.17 mg/dL (ref 0.61–1.24)
GFR, Estimated: >60 mL/min (ref >60)
Glucose, Bld: 130 mg/dL — ABNORMAL HIGH (ref 70–99)
Potassium: 4.4 mmol/L (ref 3.5–5.1)
Sodium: 145 mmol/L (ref 135–145)

## 2024-02-26 LAB — PROTIME-INR
INR: 1.6 — ABNORMAL HIGH (ref 0.8–1.2)
Prothrombin Time: 19.1 s — ABNORMAL HIGH (ref 11.4–15.2)

## 2024-02-26 LAB — BRAIN NATRIURETIC PEPTIDE: B Natriuretic Peptide: 514.3 pg/mL — ABNORMAL HIGH (ref 0.0–100.0)

## 2024-02-26 NOTE — ED Triage Notes (Signed)
 Pt arrives via [facility] from home with c/o bilateral leg edema x3 days. Edema is worse on the right leg, per pt the leg is weeping and if they stand still they will create a puddle. Pt has a hx of CHF. Pt denies SOB, CP. Pt is A&Ox4 during triage.

## 2024-02-26 NOTE — ED Triage Notes (Signed)
 First nurse note: Pt here via [facility] from home. Pt has increased edema from feet to calf and weeping. EMS states messing with diuretics.

## 2024-02-27 ENCOUNTER — Ambulatory Visit: Payer: Medicare Other

## 2024-02-27 ENCOUNTER — Ambulatory Visit: Admit: 2024-02-27 | Admitting: Cardiology

## 2024-02-27 ENCOUNTER — Emergency Department

## 2024-02-27 ENCOUNTER — Inpatient Hospital Stay

## 2024-02-27 DIAGNOSIS — I130 Hypertensive heart and chronic kidney disease with heart failure and stage 1 through stage 4 chronic kidney disease, or unspecified chronic kidney disease: Secondary | ICD-10-CM | POA: Diagnosis present

## 2024-02-27 DIAGNOSIS — L03119 Cellulitis of unspecified part of limb: Secondary | ICD-10-CM | POA: Diagnosis present

## 2024-02-27 DIAGNOSIS — M4807 Spinal stenosis, lumbosacral region: Secondary | ICD-10-CM | POA: Diagnosis present

## 2024-02-27 DIAGNOSIS — I4819 Other persistent atrial fibrillation: Secondary | ICD-10-CM | POA: Diagnosis present

## 2024-02-27 DIAGNOSIS — L03115 Cellulitis of right lower limb: Secondary | ICD-10-CM | POA: Diagnosis present

## 2024-02-27 DIAGNOSIS — Z961 Presence of intraocular lens: Secondary | ICD-10-CM | POA: Diagnosis present

## 2024-02-27 DIAGNOSIS — T502X5A Adverse effect of carbonic-anhydrase inhibitors, benzothiadiazides and other diuretics, initial encounter: Secondary | ICD-10-CM | POA: Diagnosis present

## 2024-02-27 DIAGNOSIS — G4730 Sleep apnea, unspecified: Secondary | ICD-10-CM | POA: Diagnosis present

## 2024-02-27 DIAGNOSIS — N183 Chronic kidney disease, stage 3 unspecified: Secondary | ICD-10-CM | POA: Diagnosis present

## 2024-02-27 DIAGNOSIS — I480 Paroxysmal atrial fibrillation: Secondary | ICD-10-CM | POA: Diagnosis not present

## 2024-02-27 DIAGNOSIS — I739 Peripheral vascular disease, unspecified: Secondary | ICD-10-CM | POA: Diagnosis present

## 2024-02-27 DIAGNOSIS — I5033 Acute on chronic diastolic (congestive) heart failure: Secondary | ICD-10-CM | POA: Diagnosis present

## 2024-02-27 DIAGNOSIS — G20A1 Parkinson's disease without dyskinesia, without mention of fluctuations: Secondary | ICD-10-CM | POA: Diagnosis present

## 2024-02-27 DIAGNOSIS — D539 Nutritional anemia, unspecified: Secondary | ICD-10-CM | POA: Diagnosis present

## 2024-02-27 DIAGNOSIS — K219 Gastro-esophageal reflux disease without esophagitis: Secondary | ICD-10-CM | POA: Diagnosis present

## 2024-02-27 DIAGNOSIS — M7989 Other specified soft tissue disorders: Secondary | ICD-10-CM | POA: Diagnosis present

## 2024-02-27 DIAGNOSIS — L03116 Cellulitis of left lower limb: Secondary | ICD-10-CM | POA: Diagnosis present

## 2024-02-27 DIAGNOSIS — L0390 Cellulitis, unspecified: Secondary | ICD-10-CM | POA: Diagnosis present

## 2024-02-27 DIAGNOSIS — N179 Acute kidney failure, unspecified: Secondary | ICD-10-CM | POA: Diagnosis not present

## 2024-02-27 DIAGNOSIS — I5031 Acute diastolic (congestive) heart failure: Secondary | ICD-10-CM | POA: Insufficient documentation

## 2024-02-27 DIAGNOSIS — I071 Rheumatic tricuspid insufficiency: Secondary | ICD-10-CM | POA: Diagnosis present

## 2024-02-27 DIAGNOSIS — I10 Essential (primary) hypertension: Secondary | ICD-10-CM | POA: Diagnosis not present

## 2024-02-27 DIAGNOSIS — E7800 Pure hypercholesterolemia, unspecified: Secondary | ICD-10-CM | POA: Diagnosis present

## 2024-02-27 DIAGNOSIS — I272 Pulmonary hypertension, unspecified: Secondary | ICD-10-CM | POA: Diagnosis present

## 2024-02-27 DIAGNOSIS — G7000 Myasthenia gravis without (acute) exacerbation: Secondary | ICD-10-CM | POA: Diagnosis present

## 2024-02-27 DIAGNOSIS — E039 Hypothyroidism, unspecified: Secondary | ICD-10-CM | POA: Diagnosis present

## 2024-02-27 DIAGNOSIS — E669 Obesity, unspecified: Secondary | ICD-10-CM | POA: Diagnosis present

## 2024-02-27 DIAGNOSIS — N281 Cyst of kidney, acquired: Secondary | ICD-10-CM | POA: Diagnosis present

## 2024-02-27 DIAGNOSIS — Z66 Do not resuscitate: Secondary | ICD-10-CM | POA: Diagnosis present

## 2024-02-27 DIAGNOSIS — J4489 Other specified chronic obstructive pulmonary disease: Secondary | ICD-10-CM | POA: Diagnosis present

## 2024-02-27 LAB — GLUCOSE, CAPILLARY: Glucose-Capillary: 99 mg/dL (ref 70–99)

## 2024-02-27 SURGERY — ECHOCARDIOGRAM, TRANSESOPHAGEAL
Anesthesia: General

## 2024-02-27 MED ORDER — AMIODARONE HCL 200 MG PO TABS: 200.0000 mg | ORAL_TABLET | Freq: Every day | ORAL | Status: DC

## 2024-02-27 MED ORDER — FUROSEMIDE 10 MG/ML IJ SOLN
40.0000 mg | Freq: Two times a day (BID) | INTRAMUSCULAR | Status: DC
  Administered 2024-02-27 – 2024-02-29 (×4): 40 mg via INTRAVENOUS
  Filled 2024-02-27 (×4): qty 4

## 2024-02-27 MED ORDER — VITAMIN D 25 MCG (1000 UNIT) PO TABS
2000.0000 [IU] | ORAL_TABLET | Freq: Every day | ORAL | Status: DC
  Administered 2024-02-27 – 2024-03-06 (×9): 2000 [IU] via ORAL
  Filled 2024-02-27 (×9): qty 2

## 2024-02-27 MED ORDER — CARBIDOPA-LEVODOPA 25-100 MG PO TABS
1.0000 | ORAL_TABLET | Freq: Three times a day (TID) | ORAL | Status: DC
  Administered 2024-02-27 – 2024-03-06 (×23): 1 via ORAL
  Filled 2024-02-27 (×24): qty 1

## 2024-02-27 MED ORDER — APIXABAN 5 MG PO TABS
5.0000 mg | ORAL_TABLET | Freq: Two times a day (BID) | ORAL | Status: DC
  Administered 2024-02-27 – 2024-03-06 (×17): 5 mg via ORAL
  Filled 2024-02-27 (×17): qty 1

## 2024-02-27 MED ORDER — METOPROLOL TARTRATE 25 MG PO TABS
12.5000 mg | ORAL_TABLET | Freq: Two times a day (BID) | ORAL | Status: DC
  Administered 2024-02-27: 12.5 mg via ORAL
  Filled 2024-02-27: qty 1

## 2024-02-27 MED ORDER — MORPHINE SULFATE (PF) 2 MG/ML IV SOLN
2.0000 mg | INTRAVENOUS | Status: DC | PRN
  Administered 2024-02-27: 2 mg via INTRAVENOUS
  Filled 2024-02-27: qty 1

## 2024-02-27 MED ORDER — ONDANSETRON HCL 4 MG/2ML IJ SOLN
4.0000 mg | Freq: Four times a day (QID) | INTRAMUSCULAR | Status: DC | PRN
  Filled 2024-02-27: qty 2

## 2024-02-27 MED ORDER — VITAMIN B-12 1000 MCG PO TABS
1000.0000 ug | ORAL_TABLET | Freq: Every day | ORAL | Status: DC
  Administered 2024-02-28 – 2024-03-06 (×8): 1000 ug via ORAL
  Filled 2024-02-27 (×8): qty 1

## 2024-02-27 MED ORDER — SODIUM CHLORIDE 0.9 % IV SOLN
1.0000 g | INTRAVENOUS | Status: DC
  Administered 2024-02-27 – 2024-02-28 (×2): 1 g via INTRAVENOUS
  Filled 2024-02-27: qty 10

## 2024-02-27 MED ORDER — ONDANSETRON HCL 4 MG PO TABS
4.0000 mg | ORAL_TABLET | Freq: Four times a day (QID) | ORAL | Status: DC | PRN
Start: 2024-02-27 — End: 2024-03-06

## 2024-02-27 MED ORDER — MECLIZINE HCL 25 MG PO TABS: 25.0000 mg | ORAL_TABLET | Freq: Three times a day (TID) | ORAL | Status: DC | PRN

## 2024-02-27 MED ORDER — TRAZODONE HCL 50 MG PO TABS
50.0000 mg | ORAL_TABLET | Freq: Every evening | ORAL | Status: DC | PRN
  Administered 2024-02-28 – 2024-03-04 (×5): 50 mg via ORAL
  Filled 2024-02-27 (×5): qty 1

## 2024-02-27 MED ORDER — ACETAMINOPHEN 650 MG RE SUPP: 650.0000 mg | Freq: Four times a day (QID) | RECTAL | Status: DC | PRN

## 2024-02-27 MED ORDER — SODIUM CHLORIDE 0.9% FLUSH
3.0000 mL | Freq: Two times a day (BID) | INTRAVENOUS | Status: DC
  Administered 2024-02-27 – 2024-03-06 (×17): 3 mL via INTRAVENOUS

## 2024-02-27 MED ORDER — PREDNISONE 10 MG PO TABS
10.0000 mg | ORAL_TABLET | ORAL | Status: DC
  Administered 2024-02-28 – 2024-03-06 (×4): 10 mg via ORAL
  Filled 2024-02-27 (×5): qty 1

## 2024-02-27 MED ORDER — LEVOTHYROXINE SODIUM 50 MCG PO TABS
50.0000 ug | ORAL_TABLET | Freq: Every day | ORAL | Status: DC
  Administered 2024-02-27 – 2024-03-06 (×9): 50 ug via ORAL
  Filled 2024-02-27 (×9): qty 1

## 2024-02-27 MED ORDER — AZATHIOPRINE 50 MG PO TABS
150.0000 mg | ORAL_TABLET | Freq: Every day | ORAL | Status: DC
  Administered 2024-02-27 – 2024-03-06 (×9): 150 mg via ORAL
  Filled 2024-02-27 (×9): qty 3

## 2024-02-27 MED ORDER — SODIUM CHLORIDE 0.9 % IV SOLN: 250.0000 mL | INTRAVENOUS | Status: AC | PRN

## 2024-02-27 MED ORDER — ACETAMINOPHEN 325 MG PO TABS
650.0000 mg | ORAL_TABLET | Freq: Four times a day (QID) | ORAL | Status: DC | PRN
  Filled 2024-02-27: qty 2

## 2024-02-27 MED ORDER — FUROSEMIDE 10 MG/ML IJ SOLN
40.0000 mg | Freq: Once | INTRAMUSCULAR | Status: AC
  Administered 2024-02-27: 40 mg via INTRAVENOUS
  Filled 2024-02-27: qty 4

## 2024-02-27 MED ORDER — SPIRONOLACTONE 25 MG PO TABS
25.0000 mg | ORAL_TABLET | Freq: Every day | ORAL | Status: DC
  Administered 2024-02-27 – 2024-03-03 (×6): 25 mg via ORAL
  Filled 2024-02-27 (×6): qty 1

## 2024-02-27 MED ORDER — SODIUM CHLORIDE 0.9% FLUSH: 3.0000 mL | INTRAVENOUS | Status: DC | PRN

## 2024-02-27 NOTE — Assessment & Plan Note (Signed)
 BMI 34.87 Complicates overall prognosis and care Lifestyle modification and exercise has been discussed with patient in detail

## 2024-02-27 NOTE — H&P (Signed)
 History and Physical    Patient: Francisco [name]:[id number] DOB: [birth year] DOA: 02/26/2024 DOS: the patient was seen and examined on 02/27/2024 PCP: Melchor Spoon, MD  Patient coming from: Home  Chief Complaint:  Chief Complaint  Patient presents with   Leg Swelling   HPI: Francisco [name] is a 78 y.o. male with medical history significant for paroxysmal A-fib on chronic anticoagulation therapy, stage III chronic kidney disease, chronic diastolic dysfunction CHF with last known LVEF of 55 to 60% from an echo TEE which was done 06/25, history of myasthenia gravis who presents to the emergency room for evaluation of bilateral lower extremity swelling. Patient is wheelchair-bound but notes that he has had worsening lower extremity swelling over the last several days despite taking his diuretics.  He admits to being noncompliant with his diet and recently lost his wife about a week ago.  He notes that when he leans over in his wheelchair to pick up something or tie his shoes he gets out of breath. He denies having any chest pain, no shortness of breath, no PND, no fever, no chills, no cough, no abdominal pain, no nausea, no vomiting, no diaphoresis, no blurred vision, no focal deficit. Abnormal labs include BNP 514, hemoglobin 10.0, bicarb 33, glucose 130 Chest x-ray reviewed by me shows trace bilateral pleural effusions Bilateral lower extremity venous duplex negative for DVT Twelve-lead EKG reviewed by me shows A-fib Patient received a dose of Lasix  40 mg IV in the ER and will be admitted to the hospital for further evaluation.   Review of Systems: As mentioned in the history of present illness. All other systems reviewed and are negative. 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   Carotid stenosis    CHF (congestive heart failure) (HCC)    CKD (chronic kidney disease)    CKD (chronic kidney disease) stage 3, GFR 30-59 ml/min (HCC)    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   DDD (degenerative disc disease), cervical    Dysrhythmia    post carotid stent bradycardia; PAF 09/2020   GERD (gastroesophageal reflux disease)    Hypercholesterolemia    Hypertension    Hypothyroidism    pt takes Levothyroxine  daily   Lumbosacral spinal stenosis    Myasthenia gravis, adult form (HCC)    PAD (peripheral artery disease) (HCC)    Parkinson disease (HCC)    Shortness of breath    Lung MD- Dr Coolidge Dent   Sleep apnea    do not use CPAP every night   Stroke (HCC) 05/31/2023   Past Surgical History:  Procedure Laterality Date   ANTERIOR CERVICAL DECOMP/DISCECTOMY FUSION  07/18/2011   Procedure: ANTERIOR CERVICAL DECOMPRESSION/DISCECTOMY FUSION 2 LEVELS;  Surgeon: Awilda Bogus Pool;  Location: [facility];  Service: Neurosurgery;  Laterality: N/A;  cervical five-six, cervical six-seven anterior cervical discectomy and fusion   ATRIAL FIBRILLATION ABLATION     BACK SURGERY     in 1985 [facility]   BILATERAL CARPAL TUNNEL RELEASE     01/2020 Right, 04/2020 Left   CARDIAC CATHETERIZATION     2005 at  [facility], no stents   CARDIOVERSION N/A 02/22/2024   Procedure: CARDIOVERSION;  Surgeon: Alluri, Odessa Bene, MD;  Location: [facility] ORS;  Service: Cardiovascular;  Laterality: N/A;   CAROTID PTA/STENT INTERVENTION N/A 09/17/2020   Procedure: CAROTID PTA/STENT INTERVENTION;  Surgeon: Celso College, MD;  Location: [facility] INVASIVE CV LAB;  Service: Cardiovascular;  Laterality: N/A;   CATARACT EXTRACTION W/PHACO Left 01/06/2020   Procedure: CATARACT EXTRACTION PHACO AND INTRAOCULAR LENS PLACEMENT (IOC) ISTENT INJ LEFT 3.81  00:33.3;  Surgeon: Rosa College, MD;  Location: [facility];  Service: Ophthalmology;  Laterality: Left;   CATARACT EXTRACTION W/PHACO Right 02/03/2020   Procedure: CATARACT EXTRACTION PHACO AND INTRAOCULAR LENS PLACEMENT (IOC) RIGHT ISTENT INJ;  Surgeon: Rosa College, MD;  Location: [facility];  Service: Ophthalmology;  Laterality: Right;  4.29 0:35.6   COLONOSCOPY     HERNIA REPAIR Left    inguinal hernia repair in 1985   LUMBAR LAMINECTOMY/DECOMPRESSION MICRODISCECTOMY Left 02/24/2014   Procedure: LUMBAR LAMINECTOMY/DECOMPRESSION MICRODISCECTOMY LUMBAR THREE-FOUR, FOUR-FIVE, LEFT FIVE-SACRAL ONE ;  Surgeon: Baruch Bosch, MD;  Location: [facility];  Service: Neurosurgery;  Laterality: Left;  LUMBAR LAMINECTOMY/DECOMPRESSION MICRODISCECTOMY LUMBAR THREE-FOUR, FOUR-FIVE, LEFT FIVE-SACRAL ONE    LUMBAR LAMINECTOMY/DECOMPRESSION MICRODISCECTOMY N/A 05/03/2021   Procedure: Laminectomy and Foraminotomy - L2-L3;  Surgeon: Agustina Aldrich, MD;  Location: [facility] OR;  Service: Neurosurgery;  Laterality: N/A;  3C   POSTERIOR CERVICAL FUSION/FORAMINOTOMY N/A 08/07/2020   Procedure: C3-6 POSTERIOR FUSION WITH DECOMPRESSION;  Surgeon: Jodeen Munch, MD;  Location: [facility] ORS;  Service: Neurosurgery;  Laterality: N/A;   PROSTATECTOMY  04/2013   [facility] Dr Kathlene Paradise    TEE WITHOUT CARDIOVERSION N/A 02/22/2024   Procedure: ECHOCARDIOGRAM, TRANSESOPHAGEAL;  Surgeon: Alluri, Odessa Bene, MD;  Location: [facility] ORS;  Service: Cardiovascular;  Laterality: N/A;   Social History:  reports that he quit smoking about 22 years ago. His smoking use included cigarettes. He started smoking about 42 years ago. He has a 20 pack-year smoking history. He has never used smokeless tobacco. He reports current alcohol use of about 3.0 standard drinks of alcohol per week. He reports that he does not use drugs.  Allergies  Allergen Reactions   Azithromycin Other (See Comments)    Avoid due to myasthenia gravis   Codeine Nausea And Vomiting    Family History  Problem  Relation Age of Onset   Hypertension Mother    Stroke Mother    Stroke Father     Prior to Admission medications   Medication Sig Start Date End Date Taking? Authorizing Provider  amiodarone (PACERONE) 200 MG tablet Take 200 mg by mouth daily.    [provider]  apixaban  (ELIQUIS ) 5 MG TABS tablet Take 5 mg by mouth 2 (two) times daily.    [provider]  azaTHIOprine  (IMURAN ) 50 MG tablet Take 150 mg by mouth daily.     [provider]  carbidopa -levodopa  (SINEMET  IR) 25-100 MG tablet Take 1 tablet by mouth 3 (three) times daily. 11/11/21   [provider]  Cholecalciferol  (D3-1000) 25 MCG (1000 UT) capsule Take 2,000 Units by mouth daily.    [provider]  clopidogrel  (PLAVIX ) 75 MG tablet Take 1 tablet by mouth once daily Patient not taking: Reported on 02/21/2024 02/12/24   Brown, Fallon E, NP  cyanocobalamin  (VITAMIN B12) 1000 MCG tablet Take 1,000 mcg by mouth daily.    [provider]  HYDROcodone -acetaminophen  (NORCO) 10-325 MG tablet Take 1-2 tablets by mouth every 6 (  six) hours as needed for moderate pain (pain score 4-6) or severe pain (pain score 7-10).    [provider]  levothyroxine  (SYNTHROID ) 50 MCG tablet Take 1 tablet (50 mcg total) by mouth daily at 6 (six) AM. 01/01/22 07/31/24  Alphonsus Jeans, MD  meclizine (ANTIVERT) 25 MG tablet Take 1 tablet by mouth 3 (three) times daily as needed. 07/31/23   [provider]  metoprolol  tartrate (LOPRESSOR ) 25 MG tablet Take 25 mg by mouth daily. 01/23/23   [provider]  predniSONE  (DELTASONE ) 10 MG tablet Take 10 mg by mouth every Monday, Wednesday, and Friday.    [provider]  pregabalin  (LYRICA ) 50 MG capsule Take 50 mg by mouth 2 (two) times daily. Patient not taking: Reported on 02/22/2024    [provider]  traZODone  (DESYREL ) 50 MG tablet Take 50 mg by mouth at bedtime as needed for sleep.    [provider]     Physical Exam: Vitals:   02/27/24 0100 02/27/24 0505 02/27/24 0715 02/27/24 0800  BP: (!) 175/90 (!) 151/76  136/72  Pulse: (!) 101 91  81  Resp:  16  14  Temp:   98.1 F (36.7 C)   TempSrc:   Oral   SpO2: 99% 99%  93%  Weight:      Height:       Physical Exam Vitals and nursing note reviewed.  Constitutional:      Comments: Chronically ill-appearing  HENT:     Head: Normocephalic and atraumatic.     Nose: Nose normal.     Mouth/Throat:     Mouth: Mucous membranes are moist.   Eyes:     Conjunctiva/sclera: Conjunctivae normal.    Cardiovascular:     Rate and Rhythm: Rhythm irregular.  Pulmonary:     Effort: Pulmonary effort is normal.     Breath sounds: Normal breath sounds.  Abdominal:     Palpations: Abdomen is soft.     Comments: Central adiposity, bowel sounds present   Musculoskeletal:     Cervical back: Normal range of motion and neck supple.     Right lower leg: Edema present.     Left lower leg: Edema present.     Comments: Redness over both anterior tibia, differential warmth Weeping bilateral lower extremities   Neurological:     Mental Status: He is alert.     Motor: Weakness present.   Psychiatric:        Mood and Affect: Mood normal.        Behavior: Behavior normal.     Data Reviewed: Relevant notes from primary care and specialist visits, past discharge summaries as available in EHR, including Care Everywhere. Prior diagnostic testing as pertinent to current admission diagnoses Updated medications and problem lists for reconciliation ED course, including vitals, labs, imaging, treatment and response to treatment Triage notes, nursing and pharmacy notes and ED provider's notes Notable results as noted in HPI Labs reviewed.  BNP 514, white count 4.0, hemoglobin 10.0, hematocrit 32.7, platelet count 231, sodium 145, potassium 4.4, chloride 105, bicarb 33, glucose 130, BUN 23, creatinine 1.17, calcium  8.7, PT 19.1, INR  1.6 Reviewed  Assessment and Plan: * Acute on chronic diastolic CHF (congestive heart failure) (HCC) Patient presents for evaluation of bilateral lower extremity swelling despite taking his diuretics as prescribed. Imaging shows trace bilateral pleural effusion Will continue Lasix  40 mg IV twice daily x 24 hours and will likely switch to oral Patient admits to being  noncompliant with his diet Elevate bilateral lower extremities Will consult cardiology Hold beta-blocker due to acute CHF  Paroxysmal atrial fibrillation (HCC) Rate controlled on amiodarone Continue Eliquis  as primary prophylaxis for an acute stroke  Obesity (BMI 30-39.9) BMI 34.87 Complicates overall prognosis and care Lifestyle modification and exercise has been discussed with patient in detail  Hypothyroidism Continue Synthroid   Essential hypertension Blood pressure is stable Monitor closely  Myasthenia gravis (HCC) Continue prednisone  and azathioprine  Supportive care      Advance Care Planning:   Code Status: Do not attempt resuscitation (DNR) PRE-ARREST INTERVENTIONS DESIRED   Consults: Cardiology  Family Communication: Plan of care was discussed with patient at the bedside.  All questions and concerns have been addressed.  He verbalizes understanding and agrees with the plan.  He lists his stepson as his healthcare power of attorney.  Severity of Illness: The appropriate patient status for this patient is INPATIENT. Inpatient status is judged to be reasonable and necessary in order to provide the required intensity of service to ensure the patient's safety. The patient's presenting symptoms, physical exam findings, and initial radiographic and laboratory data in the context of their chronic comorbidities is felt to place them at high risk for further clinical deterioration. Furthermore, it is not anticipated that the patient will be medically stable for discharge from the hospital within 2 midnights of  admission.    * I certify that at the point of admission it is my clinical judgment that the patient will require inpatient hospital care spanning beyond 2 midnights from the point of admission due to high intensity of service, high risk for further deterioration and high frequency of surveillance required.*  Author: Read Camel, MD 02/27/2024 9:46 AM  For on call review [email or web address].

## 2024-02-27 NOTE — ED Notes (Signed)
 Pt states he is having shooting pain down his right ankle MD messaged about same.

## 2024-02-27 NOTE — Consult Note (Signed)
 [facility] CARDIOLOGY CONSULT NOTE       Patient ID: Francisco [name] MRN: [id number] DOB/AGE: Aug 08, 1946 78 y.o.  Admit date: 02/26/2024 Referring Physician Dr. Broadus Canes Primary Physician Melchor Spoon, MD Primary Cardiologist Dr. Bob Burn Reason for Consultation [name]  HPI: Francisco [name] is a 78 y.o. male  with a past medical history of persistent atrial fibrillation (recent successful TEE/DCCV on 06/12, HX ablation in 2023), hx paroxsymal SVT, chronic HFpEF (predominately right sided HF), Severe TR, moderate pulmonary hypertension, hypertension, hyperlipidemia, myasthenia gravis, COPD  who presented to the ED on 02/26/2024 for worsening lower extremity edema and weeping. Cardiology was consulted for further evaluation.   Patient presented to the ED with worsening lower extremity edema with weeping. Work up in the ED notable for sodium 145, potassium 4.4, hemoglobin 10, platelets 231.  CXR with bilaterally pulmonary congestion and trace bilateral pleural effusions. BNP elevated at 514. Patient received 1x IV lasix  40 mg.   At the time of my evaluation this afternoon, patient was resting comfortably in ED stretcher.  We discussed patients sxs in further detail. Patient states he's had worsening lower extremity with wheeping last week. Patient is wheelchair bound. Patient states he's been given multiple diuretic medications from different providers and unsure if he's taking the right medication. Patient denies chest pain, SOB, orthopnea, palpitations or lightheadedness. Patient states his legs feel less tight s/p IV lasix  40 mg. No documented UOP, however patient reports he's had good UOP.   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   Carotid stenosis    CHF (congestive heart failure) (HCC)    CKD (chronic kidney disease)    CKD (chronic kidney disease) stage 3, GFR 30-59 ml/min (HCC)    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   DDD (degenerative disc disease), cervical    Dysrhythmia    post carotid stent bradycardia; PAF 09/2020   GERD (gastroesophageal reflux disease)    Hypercholesterolemia    Hypertension    Hypothyroidism    pt takes Levothyroxine  daily   Lumbosacral spinal stenosis    Myasthenia gravis, adult form (HCC)    PAD (peripheral artery disease) (HCC)    Parkinson disease (HCC)    Shortness of breath    Lung MD- Dr Coolidge Dent   Sleep apnea    do not use CPAP every night   Stroke (HCC) 05/31/2023    Past Surgical History:  Procedure Laterality Date   ANTERIOR CERVICAL DECOMP/DISCECTOMY FUSION  07/18/2011   Procedure: ANTERIOR CERVICAL DECOMPRESSION/DISCECTOMY FUSION 2 LEVELS;  Surgeon: Awilda Bogus Pool;  Location: [facility];  Service: Neurosurgery;  Laterality: N/A;  cervical five-six, cervical six-seven anterior cervical discectomy and fusion   ATRIAL FIBRILLATION ABLATION     BACK SURGERY     in 1985 [facility]   BILATERAL CARPAL TUNNEL RELEASE     01/2020 Right, 04/2020 Left   CARDIAC CATHETERIZATION     2005 at [facility], no stents   CARDIOVERSION N/A 02/22/2024   Procedure: CARDIOVERSION;  Surgeon: Alluri,  Odessa Bene, MD;  Location: [facility] ORS;  Service: Cardiovascular;  Laterality: N/A;   CAROTID PTA/STENT INTERVENTION N/A 09/17/2020   Procedure: CAROTID PTA/STENT INTERVENTION;  Surgeon: Celso College, MD;  Location: [facility] INVASIVE CV LAB;  Service: Cardiovascular;  Laterality: N/A;   CATARACT EXTRACTION W/PHACO Left 01/06/2020   Procedure: CATARACT EXTRACTION PHACO AND INTRAOCULAR LENS PLACEMENT (IOC) ISTENT INJ LEFT 3.81  00:33.3;  Surgeon: Rosa College, MD;   Location: [facility];  Service: Ophthalmology;  Laterality: Left;   CATARACT EXTRACTION W/PHACO Right 02/03/2020   Procedure: CATARACT EXTRACTION PHACO AND INTRAOCULAR LENS PLACEMENT (IOC) RIGHT ISTENT INJ;  Surgeon: Rosa College, MD;  Location: [facility];  Service: Ophthalmology;  Laterality: Right;  4.29 0:35.6   COLONOSCOPY     HERNIA REPAIR Left    inguinal hernia repair in 1985   LUMBAR LAMINECTOMY/DECOMPRESSION MICRODISCECTOMY Left 02/24/2014   Procedure: LUMBAR LAMINECTOMY/DECOMPRESSION MICRODISCECTOMY LUMBAR THREE-FOUR, FOUR-FIVE, LEFT FIVE-SACRAL ONE ;  Surgeon: Baruch Bosch, MD;  Location: [facility];  Service: Neurosurgery;  Laterality: Left;  LUMBAR LAMINECTOMY/DECOMPRESSION MICRODISCECTOMY LUMBAR THREE-FOUR, FOUR-FIVE, LEFT FIVE-SACRAL ONE    LUMBAR LAMINECTOMY/DECOMPRESSION MICRODISCECTOMY N/A 05/03/2021   Procedure: Laminectomy and Foraminotomy - L2-L3;  Surgeon: Agustina Aldrich, MD;  Location: [facility] OR;  Service: Neurosurgery;  Laterality: N/A;  3C   POSTERIOR CERVICAL FUSION/FORAMINOTOMY N/A 08/07/2020   Procedure: C3-6 POSTERIOR FUSION WITH DECOMPRESSION;  Surgeon: Jodeen Munch, MD;  Location: [facility] ORS;  Service: Neurosurgery;  Laterality: N/A;   PROSTATECTOMY  04/2013   [facility] Dr Kathlene Paradise    TEE WITHOUT CARDIOVERSION N/A 02/22/2024   Procedure: ECHOCARDIOGRAM, TRANSESOPHAGEAL;  Surgeon: Alluri, Odessa Bene, MD;  Location: [facility] ORS;  Service: Cardiovascular;  Laterality: N/A;    (Not in a hospital admission)  Social History   Socioeconomic History   Marital status: Widowed    Spouse name: [name]   Number of children: Not on file   Years of education: Not on file   Highest education level: Not on file  Occupational History   Not on file  Tobacco Use   Smoking status: Former    Current packs/day: 0.00    Average packs/day: 1 pack/day for 20.0 years (20.0 ttl pk-yrs)    Types: Cigarettes    Start date: 09/12/1981    Quit date: 09/12/2001     Years since quitting: 22.4   Smokeless tobacco: Never  Vaping Use   Vaping status: Never Used  Substance and Sexual Activity   Alcohol use: Yes    Alcohol/week: 3.0 standard drinks of alcohol    Types: 3 Glasses of wine per week    Comment: 3 glasses a wine a week   Drug use: No   Sexual activity: Yes  Other Topics Concern   Not on file  Social History Narrative   Not on file   Social Drivers of Health   Financial Resource Strain: Low Risk  (02/14/2024)   Received from [facility]   Overall Financial Resource Strain (CARDIA)    Difficulty of Paying Living Expenses: Not hard at all  Food Insecurity: No Food Insecurity (02/14/2024)   Received from [facility]   Hunger Vital Sign    Within the past 12 months, you worried that your food would run out before you got the money to buy more.: Never true    Within the past 12 months, the food you bought just didn't last and you didn't have money to get more.: Never true  Transportation Needs: No Transportation Needs (02/14/2024)   Received from [facility] - Transportation    In the past 12 months, has lack of transportation kept you from medical appointments or from getting medications?: No    Lack of Transportation (Non-Medical): No  Physical Activity: Not on file  Stress: Not on file  Social Connections: Unknown (02/10/2022)   Received from [facility]   Social Network    Social Network: Not on file  Intimate Partner Violence: Unknown (02/10/2022)   Received from [facility]   HITS    Physically Hurt: Not on file    Insult or Talk Down To: Not on file    Threaten Physical Harm: Not on file    Scream or Curse: Not on file    Family History  Problem Relation Age of Onset   Hypertension Mother    Stroke Mother    Stroke Father      Vitals:   02/27/24 0505 02/27/24 0715 02/27/24 0800 02/27/24 1121  BP: (!) 151/76  136/72   Pulse: 91  81   Resp: 16  14   Temp:   98.1 F (36.7 C)  97.8 F (36.6 C)  TempSrc:  Oral  Oral  SpO2: 99%  93%   Weight:      Height:        PHYSICAL EXAM General: Well-appearing elderly male, well nourished, in no acute distress. HEENT: Normocephalic and atraumatic. Neck: No JVD.   Lungs: Normal respiratory effort on room air.  Diminished breath sounds bilaterally Heart: Irregular, irregular, controlled rates. S1, S2, + Murmur Abdomen: Non-distended appearing.  Msk: Normal strength and tone for age. Extremities: Warm and well perfused. No clubbing, cyanosis. Redness, wheeping, 3+ pitting edema bilaterally.  Neuro: Alert and oriented X 3. Psych: Answers questions appropriately.   Labs: Basic Metabolic Panel: Recent Labs    02/26/24 1750  NA 145  K 4.4  CL 105  CO2 33*  GLUCOSE 130*  BUN 23  CREATININE 1.17  CALCIUM  8.7*   Liver Function Tests: No results for input(s): AST, ALT, ALKPHOS, BILITOT, PROT, ALBUMIN  in the last 72 hours. No results for input(s): LIPASE, AMYLASE in the last 72 hours. CBC: Recent Labs    02/26/24 1750  WBC 4.0  NEUTROABS 3.5  HGB 10.0*  HCT 32.7*  MCV 115.5*  PLT 231   Cardiac Enzymes: No results for input(s): CKTOTAL, CKMB, CKMBINDEX, TROPONINIHS in the last 72 hours. BNP: Recent Labs    02/26/24 1750  BNP 514.3*   D-Dimer: No results for input(s): DDIMER in the last 72 hours. Hemoglobin A1C: No results for input(s): HGBA1C in the last 72 hours. Fasting Lipid Panel: No results for input(s): CHOL, HDL, LDLCALC, TRIG, CHOLHDL, LDLDIRECT in the last 72 hours. Thyroid  Function Tests: No results for input(s): TSH, T4TOTAL, T3FREE, THYROIDAB in the last 72 hours.  Invalid input(s): FREET3 Anemia Panel: No results for input(s): VITAMINB12, FOLATE, FERRITIN, TIBC, IRON , RETICCTPCT in the last 72 hours.   Radiology: US  Venous Img Lower Bilateral Result Date: 02/27/2024 EXAM: ULTRASOUND DUPLEX OF THE  BILATERAL LOWER EXTREMITY VEINS TECHNIQUE: Duplex ultrasound using B-mode/gray scaled imaging and Doppler spectral analysis and color flow was obtained of the deep venous structures of the bilateral lower extremity. COMPARISON: None. CLINICAL HISTORY: Bilateral leg swelling, worse on right side. FINDINGS: Bilateral calf veins are suboptimally visualized. The visualized veins of the lower extremity are patent and free of echogenic thrombus. The veins demonstrate good compressibility with  normal color flow study and spectral analysis. IMPRESSION: 1. No evidence of DVT. Electronically signed by: Zadie Herter MD 02/27/2024 02:49 AM EDT RP Workstation: [id number]   DG Chest Portable 1 View Result Date: 02/27/2024 CLINICAL DATA:  eval for edema EXAM: PORTABLE CHEST 1 VIEW COMPARISON:  Chest x-ray 05/31/2023, CT chest 12/06/2022, chest x-ray 12/06/2022 FINDINGS: The heart and mediastinal contours are unchanged. Atherosclerotic plaque. Right base linear atelectasis versus scarring. No focal consolidation. No pulmonary edema. Question trace bilateral pleural effusions. No pneumothorax. No acute osseous abnormality. Cervical spine surgical hardware partially visualized. IMPRESSION: 1. Question trace bilateral pleural effusions. Recommend further evaluation with chest x-ray PA and lateral view of the chest. 2.  Aortic Atherosclerosis (ICD10-I70.0). Electronically Signed   By: Morgane  Naveau M.D.   On: 02/27/2024 01:56   ECHO TEE Result Date: 02/23/2024    TRANSESOPHOGEAL ECHO REPORT   Patient Name:   Francisco [name] Date of Exam: 02/22/2024 Medical Rec #:  [id number]          Height:       70.0 in Accession #:    [id number]         Weight:       230.0 lb Date of Birth:  1946/01/31          BSA:          2.215 m Patient Age:    39 years           BP:           119/73 mmHg Patient Gender: M                  HR:           81 bpm. Exam Location:  [facility] Procedure: Transesophageal Echo, Cardiac Doppler and Color Doppler  (Both            Spectral and Color Flow Doppler were utilized during procedure). Indications:     Persistent Afib I48.19  History:         Patient has prior history of Echocardiogram examinations, most                  recent 06/01/2023. Arrythmias:Atrial Fibrillation; Risk                  Factors:Hypertension.  Sonographer:     Broadus Canes Referring Phys:  [id number] Johnjoseph Rolfe Diagnosing Phys: Joetta Mustache PROCEDURE: After discussion of the risks and benefits of a TEE, an informed consent was obtained from the patient. The transesophogeal probe was passed without difficulty through the esophogus of the patient. Local oropharyngeal anesthetic was provided with Cetacaine . Sedation performed by different physician. The patient was monitored while under deep sedation. Image quality was excellent. The patient's vital signs; including heart rate, blood pressure, and oxygen saturation; remained stable throughout the procedure. The patient developed no complications during the procedure.  IMPRESSIONS  1. Left ventricular ejection fraction, by estimation, is 55 to 60%. The left ventricle has normal function.  2. Right ventricular systolic function is normal. The right ventricular size is moderately enlarged.  3. Left atrial size was mildly dilated. No left atrial/left atrial appendage thrombus was detected.  4. Right atrial size was severely dilated.  5. The mitral valve is normal in structure. Mild mitral valve regurgitation.  6. Tricuspid valve regurgitation is moderate to severe.  7. The aortic valve is tricuspid. Aortic valve regurgitation is not visualized. FINDINGS  Left Ventricle: Left ventricular ejection fraction, by estimation,  is 55 to 60%. The left ventricle has normal function. The left ventricular internal cavity size was normal in size. Right Ventricle: The right ventricular size is moderately enlarged. No increase in right ventricular wall thickness. Right ventricular systolic function is normal. Left  Atrium: Left atrial size was mildly dilated. No left atrial/left atrial appendage thrombus was detected. Right Atrium: Right atrial size was severely dilated. Pericardium: There is no evidence of pericardial effusion. Mitral Valve: The mitral valve is normal in structure. Mild mitral valve regurgitation. Tricuspid Valve: The tricuspid valve is normal in structure. Tricuspid valve regurgitation is moderate to severe. Aortic Valve: The aortic valve is tricuspid. Aortic valve regurgitation is not visualized. Pulmonic Valve: The pulmonic valve was normal in structure. Pulmonic valve regurgitation is trivial. Aorta: The aortic root is normal in size and structure. IAS/Shunts: No atrial level shunt detected by color flow Doppler. Joetta Mustache Electronically signed by Joetta Mustache Signature Date/Time: 02/23/2024/1:21:37 PM    Final     ECHO TEE 02/22/2024  1. Left ventricular ejection fraction, by estimation, is 55 to 60%. The left ventricle has normal function.   2. Right ventricular systolic function is normal. The right ventricular size is moderately enlarged.   3. Left atrial size was mildly dilated. No left atrial/left atrial appendage thrombus was detected.   4. Right atrial size was severely dilated.   5. The mitral valve is normal in structure. Mild mitral valve regurgitation.   6. Tricuspid valve regurgitation is moderate to severe.   7. The aortic valve is tricuspid. Aortic valve regurgitation is not visualized.   TELEMETRY reviewed by me 02/27/2024: Atrial fibrillation rate 80-90s   EKG reviewed by me: atrial fibrillation, rate 50s  Data reviewed by me 02/27/2024: last 24h vitals tele labs imaging I/O ED provider note, admission H&P.  Principal Problem:   Acute on chronic diastolic CHF (congestive heart failure) (HCC) Active Problems:   Myasthenia gravis (HCC)   Essential hypertension   Paroxysmal atrial fibrillation (HCC)   Hypothyroidism   Obesity (BMI 30-39.9)   Cellulitis     ASSESSMENT AND PLAN:  Francisco [name] is a 78 y.o. male  with a past medical history of persistent atrial fibrillation (recent successful TEE/DCCV on 06/12, HX ablation in 2023), hx paroxsymal SVT, chronic HFpEF (predominately right sided HF), Severe TR, moderate pulmonary hypertension, hypertension, hyperlipidemia, myasthenia gravis, COPD  who presented to the ED on 02/26/2024 for worsening lower extremity edema and weeping. Cardiology was consulted for further evaluation.   # Acute on chronic HFpEF # Paroxsymal atrial fibrillation(recent successful TEE/DCCV on 06/12, HX ablation 2023) # Hypertension # Hyperlipidemia # Moderate Pulmonary Hypertension Patient presents with worsening LEE and weeping. TEE from 06/12 with pEF (55-60%), mild MR, moderate to severe TR. CXR with bilaterally pulmonary congestion and trace bilateral pleural effusions. BNP elevated at 514. Per tele in AF rates 80-90s. -Continue IV lasix  40 mg twice daily. Closely monitor UOP, renal function. (At discharge will start PO torsemide  20 mg BID) -Monitor and replenish electrolytes for a goal K >4, Mag >2  -Ordered spironolactone  25 mg daily.  -Resume low dose metoprolol  tartrate 12.5 mg twice daily.  -Consider SGLT2i for GDMT optimization. Requested prior auth from pharmacy. Copay pending. -Continue Eliquis  5 mg twice daily for stroke risk reduction.  -Patient reports he stop taking amio 200 mg daily. Recommend continue this medication.  This patient's plan of care was discussed and created with Dr. Beau Bound and he is in agreement.  Signed: Cuthbert Turton, PA-C  02/27/2024, 5:14 PM [facility]

## 2024-02-27 NOTE — TOC Progression Note (Signed)
 Transition of Care [facility]) - Progression Note    Patient Details  Name: Francisco [name] MRN: [id number] Date of Birth: [birth year]  Transition of Care [facility]) CM/SW Contact  Elsie Halo, RN Phone Number: 02/27/2024, 12:42 PM  Clinical Narrative:      TOC spoke with the patient in the room. The patient is from home alone. He states he is able to drive to appts and to run errands. He has a PCP, Dr. Lincoln Renshaw, and uses [employer]. He currently uses an electric wheel chair inside the home and uses a manual chair when he goes out. He also has a walker and cane that he uses ocassionnaly.  No TOC needs, please outreach to [facility] if needs are identified.  Expected Discharge Plan: Home/Self Care Barriers to Discharge: Continued Medical Work up  Expected Discharge Plan and Services   Discharge Planning Services: CM Consult   Living arrangements for the past 2 months: Single Family Home                                       Social Determinants of Health (SDOH) Interventions SDOH Screenings   Food Insecurity: No Food Insecurity (02/14/2024)   Received from [facility]  Housing: Low Risk  (02/14/2024)   Received from [facility]  Transportation Needs: No Transportation Needs (02/14/2024)   Received from [facility]  Utilities: Not At Risk (02/14/2024)   Received from [facility]  Depression [phone]): Low Risk  (08/30/2021)  Financial Resource Strain: Low Risk  (02/14/2024)   Received from [facility]  Social Connections: Unknown (02/10/2022)   Received from [facility]  Tobacco Use: Medium Risk (02/26/2024)    Readmission Risk Interventions     No data to display

## 2024-02-27 NOTE — Consult Note (Signed)
 WOC Consult requested for bilat legs.  Secure chat message sent to the primary team as follows: Please note that the WOC nursing team is utilizing a standardized work plan to manage patient consults. We are triaging consults and will try to see the patients within 48 hours. Wound photos in the patient's chart allow us  to consult on the patient in the most efficient and timely manner.   Thank-you,  Wiliam Harder MSN, RN, CWOCN, [place], CNS [phone]

## 2024-02-27 NOTE — Assessment & Plan Note (Addendum)
 Patient has bilateral lower extremity weeping edema with areas of redness bilaterally over the anterior tibia and differential warmth Lower extremity venous Doppler negative for DVT Treat empirically with Rocephin Elevate lower extremity

## 2024-02-27 NOTE — Assessment & Plan Note (Addendum)
 Heart rate in 90s.  Remain in A-fib. Cardiology restarted amiodarone-apparently he has stopped taking his home meds. Continue with Eliquis  and metoprolol 

## 2024-02-27 NOTE — Assessment & Plan Note (Signed)
 Continue prednisone  and azathioprine  Supportive care

## 2024-02-27 NOTE — ED Provider Notes (Signed)
 [facility] Provider Note    Event Date/Time   First MD Initiated Contact with Patient 02/26/24 2326     (approximate)   History   Leg Swelling   HPI  Francisco [name] is a 78 y.o. male with history of atrial fibrillation, CHF, chronic kidney disease, hypertension, hyperlipidemia, COPD, myasthenia gravis, pulmonary hypertension who presents to the emergency department with increased lower extremity swelling worse on the right than the left for the past few days.  He has been taking his diuretics without relief and reports he was recently switched from Lasix  to torsemide .  Legs are weeping.  He denies any chest pain, shortness of breath but sats were 92%  on room air.   History provided by patient, daughters.    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   Carotid stenosis    CHF (congestive heart failure) (HCC)    CKD (chronic kidney disease)    CKD (chronic kidney disease) stage 3, GFR 30-59 ml/min (HCC)    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   DDD (degenerative disc disease), cervical    Dysrhythmia    post carotid stent bradycardia; PAF 09/2020   GERD (gastroesophageal reflux disease)    Hypercholesterolemia    Hypertension    Hypothyroidism    pt takes Levothyroxine  daily   Lumbosacral spinal stenosis    Myasthenia gravis, adult form (HCC)    PAD (peripheral artery disease) (HCC)    Parkinson disease (HCC)    Shortness of breath    Lung MD- Dr Coolidge Dent   Sleep apnea    do not use CPAP every night   Stroke (HCC) 05/31/2023    Past Surgical History:  Procedure Laterality Date   ANTERIOR CERVICAL DECOMP/DISCECTOMY FUSION  07/18/2011    Procedure: ANTERIOR CERVICAL DECOMPRESSION/DISCECTOMY FUSION 2 LEVELS;  Surgeon: Awilda Bogus Pool;  Location: [facility];  Service: Neurosurgery;  Laterality: N/A;  cervical five-six, cervical six-seven anterior cervical discectomy and fusion   ATRIAL FIBRILLATION ABLATION     BACK SURGERY     in 1985 [facility]   BILATERAL CARPAL TUNNEL RELEASE     01/2020 Right, 04/2020 Left   CARDIAC CATHETERIZATION     2005 at [facility], no stents   CARDIOVERSION N/A 02/22/2024   Procedure: CARDIOVERSION;  Surgeon: Alluri, Odessa Bene, MD;  Location: [facility] ORS;  Service: Cardiovascular;  Laterality: N/A;   CAROTID PTA/STENT INTERVENTION N/A 09/17/2020   Procedure: CAROTID PTA/STENT INTERVENTION;  Surgeon: Celso College, MD;  Location: [facility] INVASIVE CV LAB;  Service: Cardiovascular;  Laterality: N/A;   CATARACT EXTRACTION W/PHACO Left 01/06/2020   Procedure: CATARACT EXTRACTION PHACO AND INTRAOCULAR LENS PLACEMENT (IOC) ISTENT INJ LEFT 3.81  00:33.3;  Surgeon: Rosa College, MD;  Location: [facility];  Service: Ophthalmology;  Laterality: Left;   CATARACT EXTRACTION W/PHACO Right 02/03/2020   Procedure: CATARACT EXTRACTION PHACO AND INTRAOCULAR LENS PLACEMENT (IOC) RIGHT ISTENT INJ;  Surgeon: Rosa College, MD;  Location: [facility];  Service: Ophthalmology;  Laterality: Right;  4.29 0:35.6   COLONOSCOPY     HERNIA REPAIR Left  inguinal hernia repair in 1985   LUMBAR LAMINECTOMY/DECOMPRESSION MICRODISCECTOMY Left 02/24/2014   Procedure: LUMBAR LAMINECTOMY/DECOMPRESSION MICRODISCECTOMY LUMBAR THREE-FOUR, FOUR-FIVE, LEFT FIVE-SACRAL ONE ;  Surgeon: Baruch Bosch, MD;  Location: [facility];  Service: Neurosurgery;  Laterality: Left;  LUMBAR LAMINECTOMY/DECOMPRESSION MICRODISCECTOMY LUMBAR THREE-FOUR, FOUR-FIVE, LEFT FIVE-SACRAL ONE    LUMBAR LAMINECTOMY/DECOMPRESSION MICRODISCECTOMY N/A 05/03/2021   Procedure: Laminectomy and Foraminotomy - L2-L3;  Surgeon: Agustina Aldrich, MD;  Location: [facility];   Service: Neurosurgery;  Laterality: N/A;  3C   POSTERIOR CERVICAL FUSION/FORAMINOTOMY N/A 08/07/2020   Procedure: C3-6 POSTERIOR FUSION WITH DECOMPRESSION;  Surgeon: Jodeen Munch, MD;  Location: [facility] ORS;  Service: Neurosurgery;  Laterality: N/A;   PROSTATECTOMY  04/2013   [facility] Dr Kathlene Paradise    TEE WITHOUT CARDIOVERSION N/A 02/22/2024   Procedure: ECHOCARDIOGRAM, TRANSESOPHAGEAL;  Surgeon: Alluri, Odessa Bene, MD;  Location: [facility] ORS;  Service: Cardiovascular;  Laterality: N/A;    MEDICATIONS:  Prior to Admission medications   Medication Sig Start Date End Date Taking? Authorizing Provider  amiodarone (PACERONE) 200 MG tablet Take 200 mg by mouth daily.    [provider]  apixaban  (ELIQUIS ) 5 MG TABS tablet Take 5 mg by mouth 2 (two) times daily.    [provider]  azaTHIOprine  (IMURAN ) 50 MG tablet Take 150 mg by mouth daily.     [provider]  carbidopa -levodopa  (SINEMET  IR) 25-100 MG tablet Take 1 tablet by mouth 3 (three) times daily. 11/11/21   [provider]  Cholecalciferol  (D3-1000) 25 MCG (1000 UT) capsule Take 2,000 Units by mouth daily.    [provider]  clopidogrel  (PLAVIX ) 75 MG tablet Take 1 tablet by mouth once daily Patient not taking: Reported on 02/21/2024 02/12/24   Brown, Fallon E, NP  cyanocobalamin  (VITAMIN B12) 1000 MCG tablet Take 1,000 mcg by mouth daily.    [provider]  HYDROcodone -acetaminophen  (NORCO) 10-325 MG tablet Take 1-2 tablets by mouth every 6 (six) hours as needed for moderate pain (pain score 4-6) or severe pain (pain score 7-10).    [provider]  levothyroxine  (SYNTHROID ) 50 MCG tablet Take 1 tablet (50 mcg total) by mouth daily at 6 (six) AM. 01/01/22 07/31/24  Alphonsus Jeans, MD  meclizine (ANTIVERT) 25 MG tablet Take 1 tablet by mouth 3 (three) times daily as needed. 07/31/23   [provider]  metoprolol  tartrate (LOPRESSOR ) 25 MG tablet Take 25 mg by mouth  daily. 01/23/23   [provider]  predniSONE  (DELTASONE ) 10 MG tablet Take 10 mg by mouth every Monday, Wednesday, and Friday.    [provider]  pregabalin  (LYRICA ) 50 MG capsule Take 50 mg by mouth 2 (two) times daily. Patient not taking: Reported on 02/22/2024    [provider]  traZODone  (DESYREL ) 50 MG tablet Take 50 mg by mouth at bedtime as needed for sleep.    [provider]    Physical Exam   Triage Vital Signs: ED Triage Vitals  Encounter Vitals Group     BP 02/26/24 1744 (!) 155/94     Girls Systolic BP Percentile --      Girls Diastolic BP Percentile --      Boys Systolic BP Percentile --      Boys Diastolic BP Percentile --      Pulse Rate 02/26/24 1744 (!) 101     Resp 02/26/24 1744 17     Temp 02/26/24 1744 98 F (36.7 C)     Temp Source 02/26/24  1744 Oral     SpO2 02/26/24 1744 92 %     Weight 02/26/24 1746 243 lb (110.2 kg)     Height 02/26/24 1746 5' 10 (1.778 m)     Head Circumference --      Peak Flow --      Pain Score 02/26/24 1745 0     Pain Loc --      Pain Education --      Exclude from Growth Chart --     Most recent vital signs: Vitals:   02/26/24 1744 02/27/24 0100  BP: (!) 155/94 (!) 175/90  Pulse: (!) 101 (!) 101  Resp: 17   Temp: 98 F (36.7 C)   SpO2: 92% 99%    CONSTITUTIONAL: Alert, responds appropriately to questions.  Elderly, chronically ill-appearing HEAD: Normocephalic, atraumatic EYES: Conjunctivae clear, pupils appear equal, sclera nonicteric ENT: normal nose; moist mucous membranes NECK: Supple, normal ROM CARD: Irregularly irregular; S1 and S2 appreciated RESP: Normal chest excursion without splinting or tachypnea; breath sounds clear and equal bilaterally; no wheezes, no rhonchi, no rales, no hypoxia or respiratory distress, speaking full sentences ABD/GI: Non-distended; soft, non-tender, no rebound, no guarding, no peritoneal signs BACK: The back appears normal EXT: Normal ROM in  all joints; no deformity noted, pitting edema in bilateral lower extremity, legs are weeping clear fluid, difficult to palpate pulses but extremities warm well-perfused, compartments soft SKIN: Normal color for age and race; warm; no rash on exposed skin NEURO: Moves all extremities equally, normal speech PSYCH: The patient's mood and manner are appropriate.   ED Results / Procedures / Treatments   LABS: (all labs ordered are listed, but only abnormal results are displayed) Labs Reviewed  CBC WITH DIFFERENTIAL/PLATELET - Abnormal; Notable for the following components:      Result Value   RBC 2.83 (*)    Hemoglobin 10.0 (*)    HCT 32.7 (*)    MCV 115.5 (*)    MCH 35.3 (*)    RDW 16.4 (*)    Lymphs Abs 0.2 (*)    All other components within normal limits  BRAIN NATRIURETIC PEPTIDE - Abnormal; Notable for the following components:   B Natriuretic Peptide 514.3 (*)    All other components within normal limits  PROTIME-INR - Abnormal; Notable for the following components:   Prothrombin Time 19.1 (*)    INR 1.6 (*)    All other components within normal limits  BASIC METABOLIC PANEL WITH GFR - Abnormal; Notable for the following components:   CO2 33 (*)    Glucose, Bld 130 (*)    Calcium  8.7 (*)    All other components within normal limits     EKG:  RADIOLOGY: My personal review and interpretation of imaging: Chest x-ray shows no pulmonary edema.  Ultrasound shows no DVT.  I have personally reviewed all radiology reports.   US  Venous Img Lower Bilateral Result Date: 02/27/2024 EXAM: ULTRASOUND DUPLEX OF THE BILATERAL LOWER EXTREMITY VEINS TECHNIQUE: Duplex ultrasound using B-mode/gray scaled imaging and Doppler spectral analysis and color flow was obtained of the deep venous structures of the bilateral lower extremity. COMPARISON: None. CLINICAL HISTORY: Bilateral leg swelling, worse on right side. FINDINGS: Bilateral calf veins are suboptimally visualized. The visualized veins of  the lower extremity are patent and free of echogenic thrombus. The veins demonstrate good compressibility with normal color flow study and spectral analysis. IMPRESSION: 1. No evidence of DVT. Electronically signed by: Zadie Herter MD 02/27/2024 02:49 AM EDT RP  Workstation: [id number]   DG Chest Portable 1 View Result Date: 02/27/2024 CLINICAL DATA:  eval for edema EXAM: PORTABLE CHEST 1 VIEW COMPARISON:  Chest x-ray 05/31/2023, CT chest 12/06/2022, chest x-ray 12/06/2022 FINDINGS: The heart and mediastinal contours are unchanged. Atherosclerotic plaque. Right base linear atelectasis versus scarring. No focal consolidation. No pulmonary edema. Question trace bilateral pleural effusions. No pneumothorax. No acute osseous abnormality. Cervical spine surgical hardware partially visualized. IMPRESSION: 1. Question trace bilateral pleural effusions. Recommend further evaluation with chest x-ray PA and lateral view of the chest. 2.  Aortic Atherosclerosis (ICD10-I70.0). Electronically Signed   By: Morgane  Naveau M.D.   On: 02/27/2024 01:56     PROCEDURES:  Critical Care performed: No      .1-3 Lead EKG Interpretation  Performed by: Jo Booze, Clover Dao, DO Authorized by: Charleston Vierling, Clover Dao, DO     Interpretation: abnormal     ECG rate:  101   ECG rate assessment: tachycardic     Rhythm: atrial fibrillation     Ectopy: none     Conduction: normal       IMPRESSION / MDM / ASSESSMENT AND PLAN / ED COURSE  I reviewed the triage vital signs and the nursing notes.    Patient here with increased leg swelling.  History of CHF.  Reports compliance with his diuretics.  The patient is on the cardiac monitor to evaluate for evidence of arrhythmia and/or significant heart rate changes.   DIFFERENTIAL DIAGNOSIS (includes but not limited to):   CHF exacerbation, DVT, no sign of compartment syndrome, septic arthritis, gout, cellulitis.   Patient's presentation is most consistent with acute  presentation with potential threat to life or bodily function.   PLAN: Workup initiated from triage.  Chronic and stable anemia.  BNP elevated at 514.  Creatinine of 1.17.  Chest x-ray reviewed and interpreted by myself and the radiologist and shows no acute abnormality.  He denies any chest pain, shortness of breath.  Will give IV Lasix  and admit for CHF exacerbation.   MEDICATIONS GIVEN IN ED: Medications  morphine  (PF) 2 MG/ML injection 2 mg (has no administration in time range)  furosemide  (LASIX ) injection 40 mg (40 mg Intravenous Given 02/27/24 0046)     ED COURSE: Bilateral lower extremity Dopplers reviewed and interpreted by myself and the radiologist are negative for DVT.   CONSULTS:  Consulted and discussed patient's case with hospitalist, Dr. Achilles Holes.  I have recommended admission and consulting physician agrees and will place admission orders.  Patient (and family if present) agree with this plan.   I reviewed all nursing notes, vitals, pertinent previous records.  All labs, EKGs, imaging ordered have been independently reviewed and interpreted by myself.    OUTSIDE RECORDS REVIEWED: Patient just recently admitted to the hospital for atrial fibrillation.       FINAL CLINICAL IMPRESSION(S) / ED DIAGNOSES   Final diagnoses:  Acute on chronic congestive heart failure, unspecified heart failure type (HCC)  Leg swelling     Rx / DC Orders   ED Discharge Orders     None        Note:  This document was prepared using Dragon voice recognition software and may include unintentional dictation errors.   Cristi Gwynn, Clover Dao, DO 02/27/24 [phone]

## 2024-02-27 NOTE — Progress Notes (Signed)
 Heart Failure Navigator Progress Note  Assessed for Heart & Vascular TOC clinic readiness.  Does not meet criteria due to current [facility] patient.   Navigator will sign off at this time.  Roxy Horseman, RN, BSN [facility] Heart Failure Navigator Secure Chat Only

## 2024-02-27 NOTE — Assessment & Plan Note (Signed)
 Blood pressure is stable Monitor closely

## 2024-02-27 NOTE — Assessment & Plan Note (Signed)
 Continue Synthroid

## 2024-02-27 NOTE — Assessment & Plan Note (Addendum)
 Patient presents for evaluation of bilateral lower extremity swelling despite taking his diuretics as prescribed. Imaging shows trace bilateral pleural effusion, BNP elevated at 514. - Continue with IV Lasix  at 40 mg twice daily -Daily weight and BMP -Strict intake and output

## 2024-02-27 NOTE — ED Notes (Signed)
Informed RN bed assigned 

## 2024-02-28 ENCOUNTER — Other Ambulatory Visit (HOSPITAL_COMMUNITY): Payer: Self-pay

## 2024-02-28 ENCOUNTER — Telehealth (HOSPITAL_COMMUNITY): Payer: Self-pay | Admitting: Pharmacy Technician

## 2024-02-28 DIAGNOSIS — I480 Paroxysmal atrial fibrillation: Secondary | ICD-10-CM

## 2024-02-28 DIAGNOSIS — L03119 Cellulitis of unspecified part of limb: Secondary | ICD-10-CM | POA: Diagnosis not present

## 2024-02-28 DIAGNOSIS — G20A1 Parkinson's disease without dyskinesia, without mention of fluctuations: Secondary | ICD-10-CM

## 2024-02-28 DIAGNOSIS — G7000 Myasthenia gravis without (acute) exacerbation: Secondary | ICD-10-CM

## 2024-02-28 DIAGNOSIS — I10 Essential (primary) hypertension: Secondary | ICD-10-CM

## 2024-02-28 DIAGNOSIS — E669 Obesity, unspecified: Secondary | ICD-10-CM

## 2024-02-28 DIAGNOSIS — E039 Hypothyroidism, unspecified: Secondary | ICD-10-CM

## 2024-02-28 DIAGNOSIS — I5033 Acute on chronic diastolic (congestive) heart failure: Secondary | ICD-10-CM | POA: Diagnosis not present

## 2024-02-28 DIAGNOSIS — D539 Nutritional anemia, unspecified: Secondary | ICD-10-CM

## 2024-02-28 LAB — CBC
HCT: 30 % — ABNORMAL LOW (ref 39.0–52.0)
Hemoglobin: 9.2 g/dL — ABNORMAL LOW (ref 13.0–17.0)
MCH: 33.9 pg (ref 26.0–34.0)
MCHC: 30.7 g/dL (ref 30.0–36.0)
MCV: 110.7 fL — ABNORMAL HIGH (ref 80.0–100.0)
Platelets: 244 10*3/uL (ref 150–400)
RBC: 2.71 MIL/uL — ABNORMAL LOW (ref 4.22–5.81)
RDW: 16.4 % — ABNORMAL HIGH (ref 11.5–15.5)
WBC: 3.7 10*3/uL — ABNORMAL LOW (ref 4.0–10.5)
nRBC: 0.8 % — ABNORMAL HIGH (ref 0.0–0.2)

## 2024-02-28 LAB — VITAMIN B12: Vitamin B-12: 376 pg/mL (ref 180–914)

## 2024-02-28 LAB — RETICULOCYTES
Immature Retic Fract: 28.4 % — ABNORMAL HIGH (ref 2.3–15.9)
RBC.: 2.7 MIL/uL — ABNORMAL LOW (ref 4.22–5.81)
Retic Count, Absolute: 55.9 10*3/uL (ref 19.0–186.0)
Retic Ct Pct: 2.1 % (ref 0.4–3.1)

## 2024-02-28 LAB — BASIC METABOLIC PANEL WITH GFR
Anion gap: 9 (ref 5–15)
BUN: 20 mg/dL (ref 8–23)
CO2: 32 mmol/L (ref 22–32)
Calcium: 8.5 mg/dL — ABNORMAL LOW (ref 8.9–10.3)
Chloride: 102 mmol/L (ref 98–111)
Creatinine, Ser: 1.04 mg/dL (ref 0.61–1.24)
GFR, Estimated: >60 mL/min (ref >60)
Glucose, Bld: 89 mg/dL (ref 70–99)
Potassium: 3.9 mmol/L (ref 3.5–5.1)
Sodium: 143 mmol/L (ref 135–145)

## 2024-02-28 LAB — IRON AND TIBC
Iron: 52 ug/dL (ref 45–182)
Saturation Ratios: 18 % (ref 17.9–39.5)
TIBC: 291 ug/dL (ref 250–450)
UIBC: 239 ug/dL

## 2024-02-28 LAB — FERRITIN: Ferritin: 45 ng/mL (ref 24–336)

## 2024-02-28 LAB — FOLATE: Folate: 9 ng/mL (ref >5.9)

## 2024-02-28 MED ORDER — DAPAGLIFLOZIN PROPANEDIOL 10 MG PO TABS
10.0000 mg | ORAL_TABLET | Freq: Every day | ORAL | Status: DC
  Administered 2024-02-28 – 2024-03-06 (×7): 10 mg via ORAL
  Filled 2024-02-28 (×8): qty 1

## 2024-02-28 MED ORDER — SODIUM CHLORIDE 0.9 % IV SOLN
2.0000 g | INTRAVENOUS | Status: DC
  Administered 2024-02-29 – 2024-03-04 (×5): 2 g via INTRAVENOUS
  Filled 2024-02-28 (×5): qty 20

## 2024-02-28 MED ORDER — AMIODARONE HCL 200 MG PO TABS
400.0000 mg | ORAL_TABLET | Freq: Two times a day (BID) | ORAL | Status: DC
  Administered 2024-02-28 – 2024-03-06 (×15): 400 mg via ORAL
  Filled 2024-02-28 (×15): qty 2

## 2024-02-28 MED ORDER — AMIODARONE HCL 200 MG PO TABS: 200.0000 mg | ORAL_TABLET | Freq: Every day | ORAL | Status: DC

## 2024-02-28 MED ORDER — POLYETHYLENE GLYCOL 3350 17 G PO PACK
17.0000 g | PACK | Freq: Every day | ORAL | Status: DC
  Administered 2024-02-29 – 2024-03-05 (×5): 17 g via ORAL
  Filled 2024-02-28 (×7): qty 1

## 2024-02-28 MED ORDER — METOPROLOL TARTRATE 25 MG PO TABS
12.5000 mg | ORAL_TABLET | Freq: Two times a day (BID) | ORAL | Status: DC
  Administered 2024-02-28 – 2024-03-05 (×13): 12.5 mg via ORAL
  Filled 2024-02-28 (×13): qty 1

## 2024-02-28 NOTE — Assessment & Plan Note (Signed)
 Continue home Sinemet.

## 2024-02-28 NOTE — Progress Notes (Signed)
 [facility] CLINIC CARDIOLOGY PROGRESS NOTE       Patient ID: Francisco [name] MRN: [id number] DOB/AGE: Nov 30, 1945 78 y.o.  Admit date: 02/26/2024 Referring Physician Dr. Broadus Canes Primary Physician Melchor Spoon, MD Primary Cardiologist Dr. Bob Burn Reason for Consultation [name]  HPI: Francisco [name] is a 78 y.o. male  with a past medical history of persistent atrial fibrillation (recent successful TEE/DCCV on 06/12, HX ablation in 2023), hx paroxsymal SVT, chronic HFpEF (predominately right sided HF), Severe TR, moderate pulmonary hypertension, hypertension, hyperlipidemia, myasthenia gravis, COPD  who presented to the ED on 02/26/2024 for worsening lower extremity edema and weeping. Cardiology was consulted for further evaluation.   Interval History: -Patient seen and examined this AM and laying comfortably in hospital bed. Patient states he feels okay. Patient with lower extremity edema. Denies chest pain, SOB or orthopnea.  -Patients BP elevated and HR stable this AM. Per tele in AF with rates 80-90s -Yesterday UOP 2.6L, with stable renal function. -Patient remains on room air with stable SpO2.   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   Carotid stenosis    CHF (congestive heart failure) (HCC)    CKD (chronic kidney disease)    CKD (chronic kidney disease) stage 3, GFR 30-59 ml/min (HCC)    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   DDD (degenerative disc disease), cervical    Dysrhythmia    post carotid stent bradycardia; PAF 09/2020   GERD (gastroesophageal reflux disease)    Hypercholesterolemia     Hypertension    Hypothyroidism    pt takes Levothyroxine  daily   Lumbosacral spinal stenosis    Myasthenia gravis, adult form (HCC)    PAD (peripheral artery disease) (HCC)    Parkinson disease (HCC)    Shortness of breath    Lung MD- Dr Coolidge Dent   Sleep apnea    do not use CPAP every night   Stroke (HCC) 05/31/2023    Past Surgical History:  Procedure Laterality Date   ANTERIOR CERVICAL DECOMP/DISCECTOMY FUSION  07/18/2011   Procedure: ANTERIOR CERVICAL DECOMPRESSION/DISCECTOMY FUSION 2 LEVELS;  Surgeon: Awilda Bogus Pool;  Location: [facility];  Service: Neurosurgery;  Laterality: N/A;  cervical five-six, cervical six-seven anterior cervical discectomy and fusion   ATRIAL FIBRILLATION ABLATION     BACK SURGERY     in 1985 [facility]   BILATERAL CARPAL TUNNEL RELEASE     01/2020 Right, 04/2020 Left   CARDIAC CATHETERIZATION     2005 at [facility], no stents   CARDIOVERSION N/A 02/22/2024   Procedure: CARDIOVERSION;  Surgeon: Alluri, Odessa Bene, MD;  Location: [facility] ORS;  Service: Cardiovascular;  Laterality: N/A;   CAROTID PTA/STENT INTERVENTION N/A 09/17/2020   Procedure: CAROTID PTA/STENT INTERVENTION;  Surgeon: Celso College, MD;  Location: [facility] INVASIVE CV LAB;  Service: Cardiovascular;  Laterality: N/A;   CATARACT EXTRACTION W/PHACO Left 01/06/2020   Procedure: CATARACT EXTRACTION PHACO AND INTRAOCULAR LENS PLACEMENT (IOC) ISTENT INJ LEFT 3.81  00:33.3;  Surgeon: Rosa College, MD;  Location: [facility];  Service: Ophthalmology;  Laterality: Left;   CATARACT EXTRACTION W/PHACO Right 02/03/2020   Procedure: CATARACT EXTRACTION PHACO AND INTRAOCULAR LENS PLACEMENT (IOC) RIGHT ISTENT INJ;  Surgeon: Rosa College, MD;  Location: [facility];  Service: Ophthalmology;  Laterality: Right;  4.29 0:35.6   COLONOSCOPY     HERNIA REPAIR Left    inguinal hernia repair in 1985   LUMBAR LAMINECTOMY/DECOMPRESSION MICRODISCECTOMY Left 02/24/2014   Procedure: LUMBAR  LAMINECTOMY/DECOMPRESSION MICRODISCECTOMY LUMBAR THREE-FOUR, FOUR-FIVE, LEFT FIVE-SACRAL ONE ;  Surgeon: Baruch Bosch, MD;  Location: [facility];  Service: Neurosurgery;  Laterality: Left;  LUMBAR LAMINECTOMY/DECOMPRESSION MICRODISCECTOMY LUMBAR THREE-FOUR, FOUR-FIVE, LEFT FIVE-SACRAL ONE    LUMBAR LAMINECTOMY/DECOMPRESSION MICRODISCECTOMY N/A 05/03/2021   Procedure: Laminectomy and Foraminotomy - L2-L3;  Surgeon: Agustina Aldrich, MD;  Location: [facility] OR;  Service: Neurosurgery;  Laterality: N/A;  3C   POSTERIOR CERVICAL FUSION/FORAMINOTOMY N/A 08/07/2020   Procedure: C3-6 POSTERIOR FUSION WITH DECOMPRESSION;  Surgeon: Jodeen Munch, MD;  Location: [facility] ORS;  Service: Neurosurgery;  Laterality: N/A;   PROSTATECTOMY  04/2013   [facility] Dr Kathlene Paradise    TEE WITHOUT CARDIOVERSION N/A 02/22/2024   Procedure: ECHOCARDIOGRAM, TRANSESOPHAGEAL;  Surgeon: Janette Medley, MD;  Location: [facility] ORS;  Service: Cardiovascular;  Laterality: N/A;    Medications Prior to Admission  Medication Sig Dispense Refill Last Dose/Taking   apixaban  (ELIQUIS ) 5 MG TABS tablet Take 5 mg by mouth 2 (two) times daily.   02/26/2024 at  8:00 PM   azaTHIOprine  (IMURAN ) 50 MG tablet Take 150 mg by mouth daily.    02/26/2024 at  9:00 AM   carbidopa -levodopa  (SINEMET  IR) 25-100 MG tablet Take 1 tablet by mouth 3 (three) times daily.   02/26/2024 at  8:00 PM   Cholecalciferol  (D3-1000) 25 MCG (1000 UT) capsule Take 2,000 Units by mouth daily.   02/26/2024 Morning   cyanocobalamin  (VITAMIN B12) 1000 MCG tablet Take 1,000 mcg by mouth daily.   02/26/2024 Morning   HYDROcodone -acetaminophen  (NORCO) 10-325 MG tablet Take 1-2 tablets by mouth every 6 (six) hours as needed for moderate pain (pain score 4-6) or severe pain (pain score 7-10).   Unknown   levothyroxine  (SYNTHROID ) 50 MCG tablet Take 1 tablet (50 mcg total) by mouth daily at 6 (six) AM. 30 tablet 0 02/26/2024 Morning   meclizine (ANTIVERT) 25 MG tablet Take 1 tablet by mouth 3  (three) times daily as needed for dizziness.   Unknown   metoprolol  tartrate (LOPRESSOR ) 25 MG tablet Take 25 mg by mouth daily.   02/26/2024 Morning   predniSONE  (DELTASONE ) 10 MG tablet Take 10 mg by mouth every Monday, Wednesday, and Friday.   02/26/2024 Morning   pregabalin  (LYRICA ) 50 MG capsule Take 50 mg by mouth 2 (two) times daily.   02/26/2024 Evening   amiodarone (PACERONE) 200 MG tablet Take 200 mg by mouth daily. (Patient not taking: Reported on 02/27/2024)   Not Taking   traZODone  (DESYREL ) 50 MG tablet Take 50 mg by mouth at bedtime as needed for sleep. (Patient not taking: Reported on 02/27/2024)   Not Taking   Social History   Socioeconomic History   Marital status: Widowed    Spouse name: [name]   Number of children: Not on file   Years of education: Not on file   Highest education level: Not on file  Occupational History   Not on file  Tobacco Use   Smoking status: Former    Current packs/day: 0.00    Average packs/day: 1  pack/day for 20.0 years (20.0 ttl pk-yrs)    Types: Cigarettes    Start date: 09/12/1981    Quit date: 09/12/2001    Years since quitting: 22.4   Smokeless tobacco: Never  Vaping Use   Vaping status: Never Used  Substance and Sexual Activity   Alcohol use: Yes    Alcohol/week: 3.0 standard drinks of alcohol    Types: 3 Glasses of wine per week    Comment: 3 glasses a wine a week   Drug use: No   Sexual activity: Yes  Other Topics Concern   Not on file  Social History Narrative   Not on file   Social Drivers of Health   Financial Resource Strain: Low Risk  (02/14/2024)   Received from [facility]   Overall Financial Resource Strain (CARDIA)    Difficulty of Paying Living Expenses: Not hard at all  Food Insecurity: No Food Insecurity (02/27/2024)   Hunger Vital Sign    Worried About Running Out of Food in the Last Year: Never true    Ran Out of Food in the Last Year: Never true  Transportation Needs: No Transportation Needs  (02/27/2024)   PRAPARE - Administrator, Civil Service (Medical): No    Lack of Transportation (Non-Medical): No  Physical Activity: Not on file  Stress: Not on file  Social Connections: Moderately Integrated (02/27/2024)   Social Connection and Isolation Panel    Frequency of Communication with Friends and Family: More than three times a week    Frequency of Social Gatherings with Friends and Family: Three times a week    Attends Religious Services: More than 4 times per year    Active Member of Clubs or Organizations: Yes    Attends Banker Meetings: More than 4 times per year    Marital Status: Widowed  Intimate Partner Violence: Unknown (02/27/2024)   Humiliation, Afraid, Rape, and Kick questionnaire    Fear of Current or Ex-Partner: Not on file    Emotionally Abused: Not on file    Physically Abused: No    Sexually Abused: Not on file    Family History  Problem Relation Age of Onset   Hypertension Mother    Stroke Mother    Stroke Father      Vitals:   02/28/24 0343 02/28/24 0518 02/28/24 0610 02/28/24 0844  BP: 130/77  127/67 127/76  Pulse: 89   89  Resp: 18     Temp: 98 F (36.7 C)   98.8 F (37.1 C)  TempSrc:      SpO2: 93%   94%  Weight:  108.9 kg    Height:        PHYSICAL EXAM General: Well-appearing elderly male, well nourished, in no acute distress. HEENT: Normocephalic and atraumatic. Neck: No JVD.   Lungs: Normal respiratory effort on room air.  Diminished breath sounds bilaterally Heart: Irregular, irregular, controlled rates. S1, S2, + Murmur Abdomen: Non-distended appearing.  Msk: Normal strength and tone for age. Extremities: Warm and well perfused. No clubbing, cyanosis. Wheeping, 3+ pitting edema bilaterally.  Neuro: Alert and oriented X 3. Psych: Answers questions appropriately.   Labs: Basic Metabolic Panel: Recent Labs    02/26/24 1750 02/28/24 0315  NA 145 143  K 4.4 3.9  CL 105 102  CO2 33* 32  GLUCOSE 130*  89  BUN 23 20  CREATININE 1.17 1.04  CALCIUM  8.7* 8.5*   Liver Function Tests: No results for input(s):  AST, ALT, ALKPHOS, BILITOT, PROT, ALBUMIN  in the last 72 hours. No results for input(s): LIPASE, AMYLASE in the last 72 hours. CBC: Recent Labs    02/26/24 1750 02/28/24 0315  WBC 4.0 3.7*  NEUTROABS 3.5  --   HGB 10.0* 9.2*  HCT 32.7* 30.0*  MCV 115.5* 110.7*  PLT 231 244   Cardiac Enzymes: No results for input(s): CKTOTAL, CKMB, CKMBINDEX, TROPONINIHS in the last 72 hours. BNP: Recent Labs    02/26/24 1750  BNP 514.3*   D-Dimer: No results for input(s): DDIMER in the last 72 hours. Hemoglobin A1C: No results for input(s): HGBA1C in the last 72 hours. Fasting Lipid Panel: No results for input(s): CHOL, HDL, LDLCALC, TRIG, CHOLHDL, LDLDIRECT in the last 72 hours. Thyroid  Function Tests: No results for input(s): TSH, T4TOTAL, T3FREE, THYROIDAB in the last 72 hours.  Invalid input(s): FREET3 Anemia Panel: Recent Labs    02/28/24 0315  FOLATE 9.0  FERRITIN 45  TIBC 291  IRON  52  RETICCTPCT 2.1     Radiology: US  Venous Img Lower Bilateral Result Date: 02/27/2024 EXAM: ULTRASOUND DUPLEX OF THE BILATERAL LOWER EXTREMITY VEINS TECHNIQUE: Duplex ultrasound using B-mode/gray scaled imaging and Doppler spectral analysis and color flow was obtained of the deep venous structures of the bilateral lower extremity. COMPARISON: None. CLINICAL HISTORY: Bilateral leg swelling, worse on right side. FINDINGS: Bilateral calf veins are suboptimally visualized. The visualized veins of the lower extremity are patent and free of echogenic thrombus. The veins demonstrate good compressibility with normal color flow study and spectral analysis. IMPRESSION: 1. No evidence of DVT. Electronically signed by: Zadie Herter MD 02/27/2024 02:49 AM EDT RP Workstation: [id number]   DG Chest Portable 1 View Result Date: 02/27/2024 CLINICAL DATA:   eval for edema EXAM: PORTABLE CHEST 1 VIEW COMPARISON:  Chest x-ray 05/31/2023, CT chest 12/06/2022, chest x-ray 12/06/2022 FINDINGS: The heart and mediastinal contours are unchanged. Atherosclerotic plaque. Right base linear atelectasis versus scarring. No focal consolidation. No pulmonary edema. Question trace bilateral pleural effusions. No pneumothorax. No acute osseous abnormality. Cervical spine surgical hardware partially visualized. IMPRESSION: 1. Question trace bilateral pleural effusions. Recommend further evaluation with chest x-ray PA and lateral view of the chest. 2.  Aortic Atherosclerosis (ICD10-I70.0). Electronically Signed   By: Morgane  Naveau M.D.   On: 02/27/2024 01:56   ECHO TEE Result Date: 02/23/2024    TRANSESOPHOGEAL ECHO REPORT   Patient Name:   Francisco [name] Date of Exam: 02/22/2024 Medical Rec #:  [id number]          Height:       70.0 in Accession #:    [id number]         Weight:       230.0 lb Date of Birth:  1946/08/21          BSA:          2.215 m Patient Age:    83 years           BP:           119/73 mmHg Patient Gender: M                  HR:           81 bpm. Exam Location:  [facility] Procedure: Transesophageal Echo, Cardiac Doppler and Color Doppler (Both            Spectral and Color Flow Doppler were utilized during procedure). Indications:     Persistent Afib I48.19  History:  Patient has prior history of Echocardiogram examinations, most                  recent 06/01/2023. Arrythmias:Atrial Fibrillation; Risk                  Factors:Hypertension.  Sonographer:     Broadus Canes Referring Phys:  [id number] Marguita Venning Diagnosing Phys: Joetta Mustache PROCEDURE: After discussion of the risks and benefits of a TEE, an informed consent was obtained from the patient. The transesophogeal probe was passed without difficulty through the esophogus of the patient. Local oropharyngeal anesthetic was provided with Cetacaine . Sedation performed by different physician. The patient  was monitored while under deep sedation. Image quality was excellent. The patient's vital signs; including heart rate, blood pressure, and oxygen saturation; remained stable throughout the procedure. The patient developed no complications during the procedure.  IMPRESSIONS  1. Left ventricular ejection fraction, by estimation, is 55 to 60%. The left ventricle has normal function.  2. Right ventricular systolic function is normal. The right ventricular size is moderately enlarged.  3. Left atrial size was mildly dilated. No left atrial/left atrial appendage thrombus was detected.  4. Right atrial size was severely dilated.  5. The mitral valve is normal in structure. Mild mitral valve regurgitation.  6. Tricuspid valve regurgitation is moderate to severe.  7. The aortic valve is tricuspid. Aortic valve regurgitation is not visualized. FINDINGS  Left Ventricle: Left ventricular ejection fraction, by estimation, is 55 to 60%. The left ventricle has normal function. The left ventricular internal cavity size was normal in size. Right Ventricle: The right ventricular size is moderately enlarged. No increase in right ventricular wall thickness. Right ventricular systolic function is normal. Left Atrium: Left atrial size was mildly dilated. No left atrial/left atrial appendage thrombus was detected. Right Atrium: Right atrial size was severely dilated. Pericardium: There is no evidence of pericardial effusion. Mitral Valve: The mitral valve is normal in structure. Mild mitral valve regurgitation. Tricuspid Valve: The tricuspid valve is normal in structure. Tricuspid valve regurgitation is moderate to severe. Aortic Valve: The aortic valve is tricuspid. Aortic valve regurgitation is not visualized. Pulmonic Valve: The pulmonic valve was normal in structure. Pulmonic valve regurgitation is trivial. Aorta: The aortic root is normal in size and structure. IAS/Shunts: No atrial level shunt detected by color flow Doppler. Joetta Mustache Electronically signed by Joetta Mustache Signature Date/Time: 02/23/2024/1:21:37 PM    Final     ECHO TEE 02/22/2024  1. Left ventricular ejection fraction, by estimation, is 55 to 60%. The left ventricle has normal function.   2. Right ventricular systolic function is normal. The right ventricular size is moderately enlarged.   3. Left atrial size was mildly dilated. No left atrial/left atrial appendage thrombus was detected.   4. Right atrial size was severely dilated.   5. The mitral valve is normal in structure. Mild mitral valve regurgitation.   6. Tricuspid valve regurgitation is moderate to severe.   7. The aortic valve is tricuspid. Aortic valve regurgitation is not visualized.   TELEMETRY reviewed by me 02/28/2024: Atrial fibrillation rate 80-90s   EKG reviewed by me: atrial fibrillation, rate 50s  Data reviewed by me 02/28/2024: last 24h vitals tele labs imaging I/O hospitalist progress notes.  Principal Problem:   Acute on chronic diastolic CHF (congestive heart failure) (HCC) Active Problems:   Myasthenia gravis (HCC)   Essential hypertension   Paroxysmal atrial fibrillation (HCC)   Hypothyroidism   Obesity (BMI 30-39.9)  Cellulitis    ASSESSMENT AND PLAN:  Francisco [name] is a 79 y.o. male  with a past medical history of persistent atrial fibrillation (recent successful TEE/DCCV on 06/12, HX ablation in 2023), hx paroxsymal SVT, chronic HFpEF (predominately right sided HF), Severe TR, moderate pulmonary hypertension, hypertension, hyperlipidemia, myasthenia gravis, COPD  who presented to the ED on 02/26/2024 for worsening lower extremity edema and weeping. Cardiology was consulted for further evaluation.   # Acute on chronic HFpEF # Paroxsymal atrial fibrillation(recent successful TEE/DCCV on 06/12, HX ablation 2023) # Hypertension # Hyperlipidemia # Moderate Pulmonary Hypertension Patient presents with worsening LEE and weeping. TEE from 06/12 with pEF  (55-60%), mild MR, moderate to severe TR. CXR with bilaterally pulmonary congestion and trace bilateral pleural effusions. BNP elevated at 514. Per tele in AF rates 80-90s. -Continue IV lasix  40 mg twice daily. Closely monitor UOP, renal function. (At discharge will start PO torsemide  20 mg BID) -Monitor and replenish electrolytes for a goal K >4. Will avoid aggressive magnesium  replacement due to myasthenia gravis to avoid exacerbation.  -Continue spironolactone  25 mg daily.  -Continue low dose metoprolol  tartrate 12.5 mg twice daily. Start at low dose due to myasthenia gravis.  -Ordered Farxiga 10 mg daily. (Copay $47.0) -ContinuEliquis  5 mg twice daily for stroke risk reduction.  -Ordered Amio 400 mg BID for 10 days, then 200 mg daily for rate and rhythm control  -Consider initiating ARB if BP becomes elevated.  -Recommend wound care for lower extremities.  -Recommend patient move and get out of bed and at least sit in bedside recliner.   This patient's plan of care was discussed and created with Dr. Beau Bound and he is in agreement.  Signed: Rutledge Selsor, PA-C  02/28/2024, 11:57 AM [facility] Cardiology

## 2024-02-28 NOTE — Telephone Encounter (Signed)
 Patient Product/process development scientist completed.    The patient is insured through [facility]. Patient has Medicare and is not eligible for a copay card, but may be able to apply for patient assistance or Medicare RX Payment Plan (Patient Must reach out to their plan, if eligible for payment plan), if available.    Ran test claim for Farxiga 10 mg and the current 30 day co-pay is $47.00.  Ran test claim for Jardiance 10 mg and the current 30 day co-pay is $47.00.  This test claim was processed through [facility]- copay amounts may vary at other pharmacies due to pharmacy/plan contracts, or as the patient moves through the different stages of their insurance plan.     Roland Earl, CPHT Pharmacy Technician III Certified Patient Advocate [facility] Pharmacy Patient Advocate Team Direct Number: [phone]  Fax: [phone]

## 2024-02-28 NOTE — Progress Notes (Signed)
 Heart Failure Stewardship Pharmacy Note  PCP: Melchor Spoon, MD PCP-Cardiologist: None  HPI: Francisco [name] is a 78 y.o. male with paroxysmal A-fib on chronic anticoagulation therapy, stage III chronic kidney disease, chronic diastolic dysfunction CHF with last known LVEF of 55 to 60% from an echo TEE which was done 06/25, history of myasthenia gravis who presented with bilateral lower extremity edema and increased work of breathing with mild exertion. On admission, BNP was 514.3. Chest x-ray noted trace bilateral pleural effusions. Doppler negative for DVT.  Pertinent cardiac history: Echo in 05/2018 with LVEF >55% and grade II diastolic dysfunction. Low risk stress test in 07/2020. Echo at that time with LVEF 55-60% and normal diastolic function. Carotid stenting performed in 09/2020. AF ablation in 06/2022. Echo in 05/2023 with LVEF 60-65%. TEE 02/2024 noted LVEF 55-60% with severely RA dilation, mild MR, moderate to severe TR. Was cardioverted to sinus bradycardia. Patient did not take amiodarone after DCCV.  Pertinent Lab Values: Creatinine  Date Value Ref Range Status  03/04/2014 0.99 0.60 - 1.30 mg/dL Final   Creatinine, Ser  Date Value Ref Range Status  02/28/2024 1.04 0.61 - 1.24 mg/dL Final   BUN  Date Value Ref Range Status  02/28/2024 20 8 - 23 mg/dL Final  16/06/9603 25 (H) 7 - 18 mg/dL Final   Potassium  Date Value Ref Range Status  02/28/2024 3.9 3.5 - 5.1 mmol/L Final  03/04/2014 3.9 3.5 - 5.1 mmol/L Final   Sodium  Date Value Ref Range Status  02/28/2024 143 135 - 145 mmol/L Final  03/04/2014 141 136 - 145 mmol/L Final   B Natriuretic Peptide  Date Value Ref Range Status  02/26/2024 514.3 (H) 0.0 - 100.0 pg/mL Final    Comment:    Performed at [facility], [address] [place], [place] [address]   Magnesium   Date Value Ref Range Status  05/31/2023 2.1 1.7 - 2.4 mg/dL Final    Comment:    Performed at [facility], [address] [place], [place] [address]   Hgb A1c MFr Bld  Date Value Ref Range Status  06/01/2023 5.5 4.8 - 5.6 % Final    Comment:    (NOTE)         Prediabetes: 5.7 - 6.4         Diabetes: >6.4         Glycemic control for adults with diabetes: <7.0    TSH  Date Value Ref Range Status  05/31/2023 0.989 0.350 - 4.500 uIU/mL Final    Comment:    Performed by a 3rd Generation assay with a functional sensitivity of <=0.01 uIU/mL. Performed at [facility], [address] [place], [place] [address]     Vital Signs:  Temp:  [97.7 F (36.5 C)-98 F (36.7 C)] 98 F (36.7 C) (06/18 0343) Pulse Rate:  [81-89] 89 (06/18 0343) Cardiac Rhythm: Atrial fibrillation (06/18 0700) Resp:  [14-20] 18 (06/18 0343) BP: (125-143)/(67-78) 127/67 (06/18 0610) SpO2:  [93 %-96 %] 93 % (06/18 0343) Weight:  [108.9 kg (240 lb 1.3 oz)] 108.9 kg (240 lb 1.3 oz) (06/18 0518)  Intake/Output Summary (Last 24 hours) at 02/28/2024 0727 Last data filed at 02/28/2024 0630 Gross per 24 hour  Intake 240 ml  Output 2850 ml  Net -2610 ml   Current Heart Failure Medications:  Loop diuretic: furosemide  40 mg IV BID Beta-Blocker: metoprolol  tartrate 12.5 mg BID ACEI/ARB/ARNI: none MRA: spironolactone  25 mg daily SGLT2i: Farxiga  10 mg daily Other: none  Prior to admission Heart Failure Medications:  Loop diuretic: torsemide  20 mg daily Beta-Blocker: metoprolol  succinate 25 mg daily (reported not taking) ACEI/ARB/ARNI: MRA: spironolactone  25 mg daily SGLT2i: Farxiga 10 mg daily Other: none  Assessment: 1. Acute on chronic diastolic heart failure (LVEF 55-60%)  , due to NICM. NYHA class III-IV symptoms.  -Symptoms: Patient reports significant lethargy. LEE is still present with mild improvement. Patient denies shortness of breath, however, daughter present in the room reports he has persistent shortness of breath with mild exertion at baseline. -Volume: Hypervolemic on exam. Significant LEE.  Currently on furosemide  40 mg IV BID with reportedly good urine output (I/Os inaccurate). Urine color is clear. Continue IV diuresis.  -Hemodynamics: BP is mildly elevated and HR is controlled. No ECG this admission noted. -SGLT2i: Farxiga 10 mg daily restarted. -MRA: Continue spironolactone  25 mg daily -ARNI: Not a good candidate at this time given HFpEF with BP near goal. -With myasthenia gravis, beta blockade can exacerbate symptoms. This is seen less with non-DHP CCBs and amiodarone. Would also avoid aggressive magnesium  replacement.  Plan: 1) Medication changes recommended at this time: -Consider adding back PTA amiodarone and transitioning off metoprolol  given myasthenia gravis. Can use diltiazem for rate control if needed given LVEF is preserved.  2) Patient assistance: -Pending  3) Education: - Patient has been educated on current HF medications and potential additions to HF medication regimen - Patient verbalizes understanding that over the next few months, these medication doses may change and more medications may be added to optimize HF regimen - Patient has been educated on basic disease state pathophysiology and goals of therapy  Medication Assistance / Insurance Benefits Check: Does the patient have prescription insurance?    Type of insurance plan:  Does the patient qualify for medication assistance through manufacturers or grants? Pending   Outpatient Pharmacy: Prior to admission outpatient pharmacy: [facility]      Please do not hesitate to reach out with questions or concerns,  Bevely Brush, PharmD, CPP, BCPS Heart Failure Pharmacist  Phone - [phone] 02/28/2024 11:56 AM

## 2024-02-28 NOTE — Evaluation (Signed)
 Physical Therapy Evaluation Patient Details Name: Francisco [name] MRN: [id number] DOB: 10/19/1945 Today's Date: 02/28/2024  History of Present Illness  78 y.o. male with medical history significant for paroxysmal A-fib on chronic anticoagulation therapy, stage III chronic kidney disease, chronic diastolic dysfunction CHF with last known LVEF of 55 to 60% from an echo TEE which was done 06/25, history of myasthenia gravis who presents to the emergency room for evaluation of bilateral lower extremity swelling.  Clinical Impression  Pt very pleasant and eager to work with PT, ultimately did quite well.  He reports he does some ambulation as needed for grocery shopping/getting to MD/for exercises but is largely w/c bound (electric in the home and manual stays in the car for outings).  He reports generally feeling much weaker in LEs than his baseline but was able to get to sitting, standing and do ~25 ft of in-room ambulation with only CGA and rare minA.  He showed stooped posture, bent knees and had fatigue with the effort.  Pt is not at his baseline and will need continued PT to address functional limitations.        If plan is discharge home, recommend the following: A little help with walking and/or transfers;A little help with bathing/dressing/bathroom;Help with stairs or ramp for entrance;Assist for transportation;Assistance with cooking/housework   Can travel by private vehicle   Yes    Equipment Recommendations None recommended by PT  Recommendations for Other Services       Functional Status Assessment Patient has had a recent decline in their functional status and demonstrates the ability to make significant improvements in function in a reasonable and predictable amount of time.     Precautions / Restrictions Precautions Precautions: Fall Restrictions Weight Bearing Restrictions Per Provider Order: No      Mobility  Bed Mobility Overal bed mobility: Needs Assistance Bed  Mobility: Sidelying to Sit   Sidelying to sit: Contact guard assist       General bed mobility comments: Pt was able to get LEs off EOB and leverage trunk up to sitting w/ only CGA    Transfers Overall transfer level: Needs assistance Equipment used: Rolling walker (2 wheels) Transfers: Sit to/from Stand Sit to Stand: Min assist           General transfer comment: initially struggled to attain standing EOB, however with cues (and self reminders) for foot and UE set up/use he was able to rise on subsequent efforts with only minA, heavy UE use on walker once up    Ambulation/Gait Ambulation/Gait assistance: Contact guard assist Gait Distance (Feet): 25 Feet Assistive device: Rolling walker (2 wheels)         General Gait Details: Pt with very stooped and walker reliant posture but able to do some in-room ambulation with relative safety.  CGA t/o with no overt LOBs or buckling (knees bending more with increased time up). No LOBs, but reports feeling much quicker to fatigue than baseline.  Stairs            Wheelchair Mobility     Tilt Bed    Modified Rankin (Stroke Patients Only)       Balance Overall balance assessment: Needs assistance Sitting-balance support: Bilateral upper extremity supported Sitting balance-Leahy Scale: Good     Standing balance support: Bilateral upper extremity supported Standing balance-Leahy Scale: Fair  Pertinent Vitals/Pain Pain Assessment Pain Assessment: 0-10 Pain Score: 3  Pain Location: chronic back/hip/tailbone pain    Home Living Family/patient expects to be discharged to:: Private residence Living Arrangements: Alone Available Help at Discharge: Available PRN/intermittently;Family Type of Home: House Home Access: Stairs to enter Entrance Stairs-Rails: Right;Left;Can reach both Entrance Stairs-Number of Steps: 3   Home Layout: One level Home Equipment: Shower seat;Grab  bars - tub/shower;Grab bars - toilet;Hand held shower head;Wheelchair - Building surveyor;Wheelchair - Surveyor, quantity (2 wheels);Cane - single point      Prior Function Prior Level of Function : Independent/Modified Independent             Mobility Comments: Mod indep for ADLs, household and community mobilization; manual WC (self-propels with LEs) as primary mobility. Does endorse ambulating some short-distances intermittently, but requires use of RW/4WW.  Does endorse fall history, but none in recent few months - endorses step-son helping him control descent to the ground recently when he fatigued and knew he wasn't going to make it (daughter's present and report he likely falls more consistently) ADLs Comments: reports he does his grocery shopping, cooking, laundry, etc typically while seated in e-w/c     Extremity/Trunk Assessment   Upper Extremity Assessment Upper Extremity Assessment: Generalized weakness;Overall [facility] for tasks assessed    Lower Extremity Assessment Lower Extremity Assessment: Generalized weakness;Overall WFL for tasks assessed       Communication   Communication Communication: No apparent difficulties    Cognition Arousal: Alert Behavior During Therapy: WFL for tasks assessed/performed   PT - Cognitive impairments: No apparent impairments                         Following commands: Intact       Cueing Cueing Techniques: Verbal cues     General Comments General comments (skin integrity, edema, etc.): Pt reports that in the last month he has done some prolonged ambulation bouts (reports ~200 yards) - daughters indicate that this is an exaggeration.    Exercises     Assessment/Plan    PT Assessment Patient needs continued PT services  PT Problem List Decreased strength;Decreased range of motion;Decreased activity tolerance;Decreased balance;Decreased mobility;Decreased coordination;Decreased knowledge of use of  DME;Decreased safety awareness;Decreased knowledge of precautions       PT Treatment Interventions DME instruction;Gait training;Stair training;Functional mobility training;Therapeutic activities;Therapeutic exercise;Cognitive remediation;Neuromuscular re-education;Patient/family education;Balance training;Wheelchair mobility training    PT Goals (Current goals can be found in the Care Plan section)  Acute Rehab PT Goals Patient Stated Goal: get stonger to go home PT Goal Formulation: With patient Time For Goal Achievement: 03/12/24 Potential to Achieve Goals: Good    Frequency Min 2X/week     Co-evaluation               AM-PAC PT 6 Clicks Mobility  Outcome Measure Help needed turning from your back to your side while in a flat bed without using bedrails?: A Little Help needed moving from lying on your back to sitting on the side of a flat bed without using bedrails?: A Little Help needed moving to and from a bed to a chair (including a wheelchair)?: A Little Help needed standing up from a chair using your arms (e.g., wheelchair or bedside chair)?: A Little Help needed to walk in hospital room?: A Lot Help needed climbing 3-5 steps with a railing? : Total 6 Click Score: 15    End of Session Equipment Utilized During Treatment: Gait belt Activity  Tolerance: Patient tolerated treatment well;Patient limited by fatigue Patient left: with chair alarm set;with call bell/phone within reach Nurse Communication: Mobility status PT Visit Diagnosis: Muscle weakness (generalized) (M62.81);Difficulty in walking, not elsewhere classified (R26.2);Unsteadiness on feet (R26.81)    Time: 8295-6213 PT Time Calculation (min) (ACUTE ONLY): 27 min   Charges:   PT Evaluation $PT Eval Low Complexity: 1 Low PT Treatments $Gait Training: 8-22 mins PT General Charges $$ ACUTE PT VISIT: 1 Visit         Darice Edelman, DPT 02/28/2024, 4:08 PM

## 2024-02-28 NOTE — Progress Notes (Signed)
 Progress Note   Patient: Francisco [name] NWG:[id number] DOB: [birth year] DOA: 02/26/2024     1 DOS: the patient was seen and examined on 02/28/2024   Brief hospital course: Taken from H&P.  Francisco [name] is a 78 y.o. male with medical history significant for paroxysmal A-fib on chronic anticoagulation therapy, stage III chronic kidney disease, chronic diastolic dysfunction CHF with last known LVEF of 55 to 60% from an echo TEE which was done 06/25, history of myasthenia gravis who presents to the emergency room for evaluation of bilateral lower extremity swelling. Patient is wheelchair-bound but notes that he has had worsening lower extremity swelling over the last several days despite taking his diuretics.  No shortness of breath or PND.  Patient was not compliant with his dietary restrictions.  On presentation vital stable, labs with BNP of 514, hemoglobin 10, bicarb 33 CXR with trace bilateral pleural effusions.EKG with A-fib. Bilateral lower extremity venous Doppler negative for DVT  Patient was admitted for IV diuresis.  6/18: Vitals and labs stable.  Improving lower extremity edema.  Cardiology would like to continue IV diuresis.  Plan is to discharge on torsemide  20 mg twice daily.  Also started on amiodarone loading dose for A-fib. PT ordered to confirm that patient is at his baseline where he can transfer himself to wheelchair.  Assessment and Plan: * Acute on chronic diastolic CHF (congestive heart failure) (HCC) Patient presents for evaluation of bilateral lower extremity swelling despite taking his diuretics as prescribed. Imaging shows trace bilateral pleural effusion, BNP elevated at 514. - Continue with IV Lasix  at 40 mg twice daily -Daily weight and BMP -Strict intake and output  Paroxysmal atrial fibrillation (HCC) Heart rate in 90s.  Remain in A-fib. Cardiology restarted amiodarone-apparently he has stopped taking his home meds. Continue with Eliquis  and  metoprolol   Cellulitis Patient has bilateral lower extremity weeping edema with areas of redness bilaterally over the anterior tibia and differential warmth Lower extremity venous Doppler negative for DVT Treat empirically with Rocephin Elevate lower extremity  Essential hypertension Blood pressure is stable Monitor closely  Myasthenia gravis (HCC) Continue prednisone  and azathioprine  Supportive care  Hypothyroidism Continue Synthroid   Parkinson disease (HCC) - Continue home Sinemet   Macrocytic anemia Hemoglobin of 9.2 with MCV of 110. Anemia panel with normal iron  and low normal folate, B12 levels pending. - Continue to monitor and supplement as needed  Obesity (BMI 30-39.9) BMI 34.87 Complicates overall prognosis and care Lifestyle modification and exercise has been discussed with patient in detail   Subjective: Patient was seen and examined today.  Lower extremity edema improving.  No new concern.  Physical Exam: Vitals:   02/28/24 0518 02/28/24 0610 02/28/24 0844 02/28/24 1203  BP:  127/67 127/76 (!) 105/51  Pulse:   89 97  Resp:      Temp:   98.8 F (37.1 C) 98.6 F (37 C)  TempSrc:      SpO2:   94% 94%  Weight: 108.9 kg     Height:       General.  Obese elderly man, in no acute distress. Pulmonary.  Lungs clear bilaterally, normal respiratory effort. CV.  Regular rate and rhythm, no JVD, rub or murmur. Abdomen.  Soft, nontender, nondistended, BS positive. CNS.  Alert and oriented .  No focal neurologic deficit. Extremities.  1+ LE edema,  pulses intact and symmetrical. Psychiatry.  Judgment and insight appears normal.   Data Reviewed: Prior data reviewed  Family Communication: Discussed with patient  Disposition:  Status is: Inpatient Remains inpatient appropriate because: Severity of illness  Planned Discharge Destination: Home  DVT prophylaxis.  Eliquis  Time spent: 50 minutes This record has been created using Manufacturing engineer. Errors have been sought and corrected,but may not always be located. Such creation errors do not reflect on the standard of care.   Author: Luna [place], MD 02/28/2024 1:01 PM  For on call review [email or web address].

## 2024-02-28 NOTE — Plan of Care (Signed)
  Problem: Clinical Measurements: ?Goal: Ability to maintain clinical measurements within normal limits will improve ?Outcome: Progressing ?  ?Problem: Activity: ?Goal: Risk for activity intolerance will decrease ?Outcome: Progressing ?  ?Problem: Elimination: ?Goal: Will not experience complications related to urinary retention ?Outcome: Progressing ?  ?Problem: Safety: ?Goal: Ability to remain free from injury will improve ?Outcome: Progressing ?  ?Problem: Skin Integrity: ?Goal: Risk for impaired skin integrity will decrease ?Outcome: Progressing ?  ?

## 2024-02-28 NOTE — Consult Note (Signed)
 WOC Nurse Consult Note: Reason for Consult: B LE wounds  Wound type: partial thickness skin loss r/t edema vs. Venous insufficiency  Pressure Injury POA: NA  Measurement: widespread B lower legs  Wound bed: erythema with scattered partial thickness skin loss (no real open wound noted in photo documentation)  Drainage (amount, consistency, odor) weeping serous fluid per MD note  Periwound: edema, erythema  Dressing procedure/placement/frequency: Cleanse B lower legs with soap and water, dry and apply Xeroform gauze Timm Foot [phone]) to anterior and posterior legs, cover with ABD pads and secure with Kerlix roll gauze wrapped starting right above toes and ending right below knees.  Apply Ace bandage wrapped in same fashion as Kerlix for light compression.    POC discussed with bedside nurse. WOC team will not follow. Re-consult if further needs arise.   Thank you,    Ronni Colace MSN, RN-BC, [employer]

## 2024-02-28 NOTE — Assessment & Plan Note (Signed)
 Hemoglobin of 9.2 with MCV of 110. Anemia panel with normal iron  and low normal folate, B12 levels pending. - Continue to monitor and supplement as needed

## 2024-02-28 NOTE — Hospital Course (Addendum)
 Taken from H&P.  Francisco [name] is a 78 y.o. male with medical history significant for paroxysmal A-fib on chronic anticoagulation therapy, stage III chronic kidney disease, chronic diastolic dysfunction CHF with last known LVEF of 55 to 60% from an echo TEE which was done 06/25, history of myasthenia gravis who presents to the emergency room for evaluation of bilateral lower extremity swelling. Patient is wheelchair-bound but notes that he has had worsening lower extremity swelling over the last several days despite taking his diuretics.  No shortness of breath or PND.  Patient was not compliant with his dietary restrictions.  On presentation vital stable, labs with BNP of 514, hemoglobin 10, bicarb 33 CXR with trace bilateral pleural effusions.EKG with A-fib. Bilateral lower extremity venous Doppler negative for DVT  Patient was admitted for IV diuresis.  6/18: Vitals and labs stable.  Improving lower extremity edema.  Cardiology would like to continue IV diuresis.  Plan is to discharge on torsemide  20 mg twice daily.  Also started on amiodarone loading dose for A-fib. PT ordered to confirm that patient is at his baseline where he can transfer himself to wheelchair.  6/19: Vital stable, labs with increasing creatinine to 1.34 from 1.  Clinically appears euvolemic.  Cardiology wants to continue IV Lasix  despite increasing creatinine, dose decreased to once daily.  PT is recommending SNF

## 2024-02-28 NOTE — Plan of Care (Signed)
  Problem: Clinical Measurements: Goal: Ability to maintain clinical measurements within normal limits will improve Outcome: Progressing Goal: Will remain free from infection Outcome: Progressing Goal: Diagnostic test results will improve Outcome: Progressing Goal: Cardiovascular complication will be avoided Outcome: Progressing   Problem: Activity: Goal: Risk for activity intolerance will decrease Outcome: Progressing   Problem: Coping: Goal: Level of anxiety will decrease Outcome: Progressing   Problem: Elimination: Goal: Will not experience complications related to bowel motility Outcome: Progressing Goal: Will not experience complications related to urinary retention Outcome: Progressing   Problem: Safety: Goal: Ability to remain free from injury will improve Outcome: Progressing   Problem: Skin Integrity: Goal: Risk for impaired skin integrity will decrease Outcome: Progressing

## 2024-02-29 ENCOUNTER — Ambulatory Visit: Payer: Medicare Other

## 2024-02-29 DIAGNOSIS — I480 Paroxysmal atrial fibrillation: Secondary | ICD-10-CM | POA: Diagnosis not present

## 2024-02-29 DIAGNOSIS — I5033 Acute on chronic diastolic (congestive) heart failure: Secondary | ICD-10-CM | POA: Diagnosis not present

## 2024-02-29 DIAGNOSIS — L03119 Cellulitis of unspecified part of limb: Secondary | ICD-10-CM | POA: Diagnosis not present

## 2024-02-29 DIAGNOSIS — I10 Essential (primary) hypertension: Secondary | ICD-10-CM | POA: Diagnosis not present

## 2024-02-29 LAB — BASIC METABOLIC PANEL WITH GFR
Anion gap: 9 (ref 5–15)
BUN: 22 mg/dL (ref 8–23)
CO2: 34 mmol/L — ABNORMAL HIGH (ref 22–32)
Calcium: 8.7 mg/dL — ABNORMAL LOW (ref 8.9–10.3)
Chloride: 100 mmol/L (ref 98–111)
Creatinine, Ser: 1.34 mg/dL — ABNORMAL HIGH (ref 0.61–1.24)
GFR, Estimated: 54 mL/min — ABNORMAL LOW (ref >60)
Glucose, Bld: 100 mg/dL — ABNORMAL HIGH (ref 70–99)
Potassium: 3.7 mmol/L (ref 3.5–5.1)
Sodium: 143 mmol/L (ref 135–145)

## 2024-02-29 MED ORDER — FUROSEMIDE 10 MG/ML IJ SOLN: 40.0000 mg | Freq: Two times a day (BID) | INTRAMUSCULAR | Status: DC

## 2024-02-29 MED ORDER — FUROSEMIDE 10 MG/ML IJ SOLN
40.0000 mg | Freq: Every day | INTRAMUSCULAR | Status: DC
  Administered 2024-02-29: 40 mg via INTRAVENOUS
  Filled 2024-02-29: qty 4

## 2024-02-29 NOTE — Assessment & Plan Note (Signed)
 Patient presents for evaluation of bilateral lower extremity swelling despite taking his diuretics as prescribed. Imaging shows trace bilateral pleural effusion, BNP elevated at 514. - Increased IV Lasix  to daily-there is an increase in creatinine -Daily weight and BMP -Strict intake and output

## 2024-02-29 NOTE — Progress Notes (Signed)
 Progress Note   Patient: Francisco [name] EXB:[id number] DOB: 1946-07-06 DOA: 02/26/2024     2 DOS: the patient was seen and examined on 02/29/2024   Brief hospital course: Taken from H&P.  Francisco [name] is a 78 y.o. male with medical history significant for paroxysmal A-fib on chronic anticoagulation therapy, stage III chronic kidney disease, chronic diastolic dysfunction CHF with last known LVEF of 55 to 60% from an echo TEE which was done 06/25, history of myasthenia gravis who presents to the emergency room for evaluation of bilateral lower extremity swelling. Patient is wheelchair-bound but notes that he has had worsening lower extremity swelling over the last several days despite taking his diuretics.  No shortness of breath or PND.  Patient was not compliant with his dietary restrictions.  On presentation vital stable, labs with BNP of 514, hemoglobin 10, bicarb 33 CXR with trace bilateral pleural effusions.EKG with A-fib. Bilateral lower extremity venous Doppler negative for DVT  Patient was admitted for IV diuresis.  6/18: Vitals and labs stable.  Improving lower extremity edema.  Cardiology would like to continue IV diuresis.  Plan is to discharge on torsemide  20 mg twice daily.  Also started on amiodarone loading dose for A-fib. PT ordered to confirm that patient is at his baseline where he can transfer himself to wheelchair.  6/19: Vital stable, labs with increasing creatinine to 1.34 from 1.  Clinically appears euvolemic.  Cardiology wants to continue IV Lasix  despite increasing creatinine, dose decreased to once daily.  PT is recommending SNF  Assessment and Plan: * Acute on chronic diastolic CHF (congestive heart failure) (HCC) Patient presents for evaluation of bilateral lower extremity swelling despite taking his diuretics as prescribed. Imaging shows trace bilateral pleural effusion, BNP elevated at 514. - Increased IV Lasix  to daily-there is an increase in  creatinine -Daily weight and BMP -Strict intake and output  Paroxysmal atrial fibrillation (HCC) Heart rate in 90s.  Remain in A-fib. Cardiology restarted amiodarone-apparently he has stopped taking his home meds. Continue with Eliquis  and metoprolol   Cellulitis Patient has bilateral lower extremity weeping edema with areas of redness bilaterally over the anterior tibia and differential warmth Lower extremity venous Doppler negative for DVT Treat empirically with Rocephin Elevate lower extremity  Essential hypertension Blood pressure is stable Monitor closely  Myasthenia gravis (HCC) Continue prednisone  and azathioprine  Supportive care  Hypothyroidism Continue Synthroid   Parkinson disease (HCC) - Continue home Sinemet   Macrocytic anemia Hemoglobin of 9.2 with MCV of 110. Anemia panel with normal iron  and low normal folate, B12 levels pending. - Continue to monitor and supplement as needed  Obesity (BMI 30-39.9) BMI 34.87 Complicates overall prognosis and care Lifestyle modification and exercise has been discussed with patient in detail   Subjective: Patient was seen and examined today.  No shortness of breath or chest pain.  Agreed to go to rehab before going home.  Physical Exam: Vitals:   02/29/24 0320 02/29/24 0459 02/29/24 0746 02/29/24 1150  BP: 121/62  134/61 101/79  Pulse: 81  78 80  Resp: 16     Temp: 98.1 F (36.7 C)  (!) 97.5 F (36.4 C) 98.7 F (37.1 C)  TempSrc: Oral     SpO2: 94%  90% 96%  Weight:  105 kg    Height:       General.  Patient is elderly man, in no acute distress. Pulmonary.  Lungs clear bilaterally, normal respiratory effort. CV.  Regular rate and rhythm, no JVD, rub or murmur. Abdomen.  Soft, nontender, nondistended, BS positive. CNS.  Alert and oriented .  No focal neurologic deficit. Extremities.  No edema, no cyanosis, pulses intact and symmetrical. Psychiatry.  Judgment and insight appears normal.   Data Reviewed: Prior  data reviewed  Family Communication: Discussed with stepdaughter at bedside  Disposition: Status is: Inpatient Remains inpatient appropriate because: Severity of illness  Planned Discharge Destination: Home  DVT prophylaxis.  Eliquis  Time spent: 45 minutes This record has been created using Conservation officer, historic buildings. Errors have been sought and corrected,but may not always be located. Such creation errors do not reflect on the standard of care.   Author: Luna Salinas, MD 02/29/2024 4:49 PM  For on call review [email or web address].

## 2024-02-29 NOTE — TOC Initial Note (Addendum)
 Transition of Care [facility]) - Initial/Assessment Note    Patient Details  Name: Francisco [name] MRN: [id number] Date of Birth: 01-07-1946  [facility] CM/SW Contact:    Odilia Bennett, LCSW Phone Number: 02/29/2024, 11:28 AM  Clinical Narrative: CSW met with patient. No family at bedside initially. Stepdaughter, Trevor Fudge, came in later. CSW introduced role and explained that PT recommendations would be discussed. Patient is not interested in SNF placement at this time but is agreeable to home health. CSW provided CMS list for agencies that serve his zip code. First preference is [facility]. CSW left message for liaison to see if they can accept. No further concerns. CSW will continue to follow patient for support and facilitate return home once stable.                 1:29 pm: Per MD, patient is now agreeable to SNF. PASARR under manual review.  2:40 pm: Uploaded clinicals into [place] [facility] for PASARR review.  2:52 pm: PASARR obtained: [id number].  Expected Discharge Plan: Home w Home Health Services Barriers to Discharge: Continued Medical Work up   Patient Goals and CMS Choice            Expected Discharge Plan and Services   Discharge Planning Services: CM Consult Post Acute Care Choice: Home Health Living arrangements for the past 2 months: Single Family Home                                      Prior Living Arrangements/Services Living arrangements for the past 2 months: Single Family Home Lives with:: Self Patient language and need for interpreter reviewed:: Yes Do you feel safe going back to the place where you live?: Yes      Need for Family Participation in Patient Care: Yes (Comment)   Current home services: DME Criminal Activity/Legal Involvement Pertinent to Current Situation/Hospitalization: No - Comment as needed  Activities of Daily Living   ADL Screening (condition at time of admission) Independently performs ADLs?: Yes (appropriate  for developmental age) Is the patient deaf or have difficulty hearing?: No Does the patient have difficulty seeing, even when wearing glasses/contacts?: No Does the patient have difficulty concentrating, remembering, or making decisions?: No  Permission Sought/Granted Permission sought to share information with : Facility Medical sales representative, Family Supports Permission granted to share information with : Yes, Verbal Permission Granted  Share Information with NAME: Trevor Fudge  Permission granted to share info w AGENCY: Home Health Agencies  Permission granted to share info w Relationship: Step-daughter     Emotional Assessment Appearance:: Appears stated age Attitude/Demeanor/Rapport: Engaged, Gracious Affect (typically observed): Appropriate, Calm, Pleasant Orientation: : Oriented to Self, Oriented to Place, Oriented to  Time, Oriented to Situation Alcohol / Substance Use: Not Applicable Psych Involvement: No (comment)  Admission diagnosis:  Leg swelling [M79.89] Acute diastolic CHF (congestive heart failure) (HCC) [I50.31] Acute on chronic diastolic CHF (congestive heart failure) (HCC) [I50.33] Acute on chronic congestive heart failure, unspecified heart failure type [facility]) [I50.9] Patient Active Problem List   Diagnosis Date Noted   Acute on chronic diastolic CHF (congestive heart failure) (HCC) 02/27/2024   Acute diastolic CHF (congestive heart failure) (HCC) 02/27/2024   Cellulitis 02/27/2024   Stroke (HCC) 05/31/2023   Obesity (BMI 30-39.9) 05/31/2023   HLD (hyperlipidemia) 05/31/2023   Chronic diastolic CHF (congestive heart failure) (HCC) 05/31/2023   Hyperkalemia 05/31/2023   SVT (supraventricular  tachycardia) (HCC) 12/30/2021   Hypothyroidism 12/30/2021   Parkinson disease (HCC) 12/30/2021   Elevated troponin 12/30/2021   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   Acquired thrombophilia (HCC) 01/13/2021   History of decompression of median nerve  01/01/2021   Paroxysmal atrial fibrillation (HCC) 10/06/2020   Carotid stenosis, right 09/17/2020   S/P cervical spinal fusion    Leukocytosis    Essential hypertension    Anemia of chronic disease    Postoperative pain    Neuropathic pain    Cervical myelopathy (HCC) 08/07/2020   Preop cardiovascular exam 07/17/2020   SOB (shortness of breath) on exertion 07/17/2020   Leg weakness, bilateral 07/13/2020   Aortic atherosclerosis (HCC) 07/09/2020   Body mass index (BMI) 34.0-34.9, adult 12/25/2019   Myalgia 10/30/2019   Lumbar post-laminectomy syndrome 10/24/2019   PAD (peripheral artery disease) (HCC) 06/06/2019   CKD (chronic kidney disease) stage 3, GFR 30-59 ml/min (HCC) 04/29/2019   B12 deficiency 01/23/2019   Left arm numbness 02/28/2018   Neck pain 02/28/2018   Macrocytic anemia 02/02/2017   DDD (degenerative disc disease), cervical 02/02/2017   Hypothyroid 02/02/2017   MRSA (methicillin resistant staph aureus) culture positive 02/02/2017   Nocturnal hypoxia 02/02/2017   Senile purpura (HCC) 10/03/2016   Essential hypertension, benign 09/16/2016   Bilateral carotid artery disease (HCC) 09/16/2016   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/18/2016   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   History of prostate cancer 12/21/2015   Left carpal tunnel syndrome 11/11/2015   Kidney stone on left side 07/05/2015   Myasthenia gravis (HCC) 05/26/2015   Radiculitis 01/05/2015   Long-term use of high-risk medication 10/15/2014   Persistent cough 09/10/2014   Pure hypercholesterolemia 07/25/2014   Spinal stenosis, lumbar region, with neurogenic claudication 02/24/2014   Lumbosacral stenosis with neurogenic claudication 02/24/2014   COPD (chronic obstructive pulmonary disease) (HCC) 01/22/2014   PCP:  Melchor Spoon, MD Pharmacy:   [id number] [place], [place] - [address] AT [facility] [address] [place] [address] Phone:  [phone] Fax: [phone]     Social Drivers of Health (SDOH) Social History: SDOH Screenings   Food Insecurity: No Food Insecurity (02/27/2024)  Housing: Low Risk  (02/27/2024)  Transportation Needs: No Transportation Needs (02/27/2024)  Utilities: Not At Risk (02/27/2024)  Depression (PHQ2-9): Low Risk  (08/30/2021)  Financial Resource Strain: Low Risk  (02/14/2024)   Received from [facility]  Social Connections: Moderately Integrated (02/27/2024)  Tobacco Use: Medium Risk (02/26/2024)   SDOH Interventions:     Readmission Risk Interventions     No data to display

## 2024-02-29 NOTE — Assessment & Plan Note (Signed)
 Heart rate in 90s.  Remain in A-fib. Cardiology restarted amiodarone-apparently he has stopped taking his home meds. Continue with Eliquis  and metoprolol 

## 2024-02-29 NOTE — TOC PASRR Note (Signed)
 RE: Francisco [name] Date of Birth: Apr 16, 1946 Date: 02/29/2024   To Whom It May Concern:  Please be advised that the above-named patient will require a short-term nursing home stay - anticipated 30 days or less for rehabilitation and strengthening.  The plan is for return home.

## 2024-02-29 NOTE — Progress Notes (Signed)
 [facility] CLINIC CARDIOLOGY PROGRESS NOTE       Patient ID: Francisco [name] MRN: [id number] DOB/AGE: [birth year] 78 y.o.  Admit date: 02/26/2024 Referring Physician Dr. Broadus Canes Primary Physician Melchor Spoon, MD Primary Cardiologist Dr. Bob Burn Reason for Consultation [name]  HPI: Francisco [name] is a 78 y.o. male  with a past medical history of persistent atrial fibrillation (recent successful TEE/DCCV on 06/12, HX ablation in 2023), hx paroxsymal SVT, chronic HFpEF (predominately right sided HF), Severe TR, moderate pulmonary hypertension, hypertension, hyperlipidemia, myasthenia gravis, COPD  who presented to the ED on 02/26/2024 for worsening lower extremity edema and weeping. Cardiology was consulted for further evaluation.   Interval History:  -Patient seen and examined this AM and laying comfortably in hospital bed. Patient states he feels okay this AM. Patient with lower extremity edema. Denies chest pain, SOB or orthopnea.  -Patients BP and HR stable this AM. Per tele in AF with rates 70-80s -Yesterday UOP 3.3L. Cr did bump this AM however patients urine color is very clear and appears still to be hypervolemic. -Patient remains on room air with stable SpO2.   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   Carotid stenosis    CHF (congestive heart failure) (HCC)    CKD (chronic kidney disease)    CKD (chronic kidney disease) stage 3, GFR 30-59 ml/min (HCC)    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   DDD (degenerative disc disease), cervical    Dysrhythmia    post carotid stent bradycardia; PAF  09/2020   GERD (gastroesophageal reflux disease)    Hypercholesterolemia    Hypertension    Hypothyroidism    pt takes Levothyroxine  daily   Lumbosacral spinal stenosis    Myasthenia gravis, adult form (HCC)    PAD (peripheral artery disease) (HCC)    Parkinson disease (HCC)    Shortness of breath    Lung MD- Dr Coolidge Dent   Sleep apnea    do not use CPAP every night   Stroke (HCC) 05/31/2023    Past Surgical History:  Procedure Laterality Date   ANTERIOR CERVICAL DECOMP/DISCECTOMY FUSION  07/18/2011   Procedure: ANTERIOR CERVICAL DECOMPRESSION/DISCECTOMY FUSION 2 LEVELS;  Surgeon: Awilda Bogus Pool;  Location: [facility];  Service: Neurosurgery;  Laterality: N/A;  cervical five-six, cervical six-seven anterior cervical discectomy and fusion   ATRIAL FIBRILLATION ABLATION     BACK SURGERY     in 1985 [facility]   BILATERAL CARPAL TUNNEL RELEASE     01/2020 Right, 04/2020 Left   CARDIAC CATHETERIZATION     2005 at [facility], no stents   CARDIOVERSION N/A 02/22/2024   Procedure: CARDIOVERSION;  Surgeon: Alluri, Odessa Bene, MD;  Location: [facility] ORS;  Service: Cardiovascular;  Laterality: N/A;   CAROTID PTA/STENT INTERVENTION N/A 09/17/2020   Procedure: CAROTID PTA/STENT INTERVENTION;  Surgeon: Celso College, MD;  Location: [facility] INVASIVE CV LAB;  Service: Cardiovascular;  Laterality: N/A;   CATARACT EXTRACTION W/PHACO Left 01/06/2020   Procedure: CATARACT EXTRACTION PHACO AND INTRAOCULAR LENS PLACEMENT (IOC)  ISTENT INJ LEFT 3.81  00:33.3;  Surgeon: Rosa College, MD;  Location: [facility];  Service: Ophthalmology;  Laterality: Left;   CATARACT EXTRACTION W/PHACO Right 02/03/2020   Procedure: CATARACT EXTRACTION PHACO AND INTRAOCULAR LENS PLACEMENT (IOC) RIGHT ISTENT INJ;  Surgeon: Rosa College, MD;  Location: [facility];  Service: Ophthalmology;  Laterality: Right;  4.29 0:35.6   COLONOSCOPY     HERNIA REPAIR Left    inguinal hernia repair in 1985   LUMBAR  LAMINECTOMY/DECOMPRESSION MICRODISCECTOMY Left 02/24/2014   Procedure: LUMBAR LAMINECTOMY/DECOMPRESSION MICRODISCECTOMY LUMBAR THREE-FOUR, FOUR-FIVE, LEFT FIVE-SACRAL ONE ;  Surgeon: Baruch Bosch, MD;  Location: [facility];  Service: Neurosurgery;  Laterality: Left;  LUMBAR LAMINECTOMY/DECOMPRESSION MICRODISCECTOMY LUMBAR THREE-FOUR, FOUR-FIVE, LEFT FIVE-SACRAL ONE    LUMBAR LAMINECTOMY/DECOMPRESSION MICRODISCECTOMY N/A 05/03/2021   Procedure: Laminectomy and Foraminotomy - L2-L3;  Surgeon: Agustina Aldrich, MD;  Location: [facility] OR;  Service: Neurosurgery;  Laterality: N/A;  3C   POSTERIOR CERVICAL FUSION/FORAMINOTOMY N/A 08/07/2020   Procedure: C3-6 POSTERIOR FUSION WITH DECOMPRESSION;  Surgeon: Jodeen Munch, MD;  Location: [facility] ORS;  Service: Neurosurgery;  Laterality: N/A;   PROSTATECTOMY  04/2013   [facility] Dr Kathlene Paradise    TEE WITHOUT CARDIOVERSION N/A 02/22/2024   Procedure: ECHOCARDIOGRAM, TRANSESOPHAGEAL;  Surgeon: Janette Medley, MD;  Location: [facility] ORS;  Service: Cardiovascular;  Laterality: N/A;    Medications Prior to Admission  Medication Sig Dispense Refill Last Dose/Taking   apixaban  (ELIQUIS ) 5 MG TABS tablet Take 5 mg by mouth 2 (two) times daily.   02/26/2024 at  8:00 PM   azaTHIOprine  (IMURAN ) 50 MG tablet Take 150 mg by mouth daily.    02/26/2024 at  9:00 AM   carbidopa -levodopa  (SINEMET  IR) 25-100 MG tablet Take 1 tablet by mouth 3 (three) times daily.   02/26/2024 at  8:00 PM   Cholecalciferol  (D3-1000) 25 MCG (1000 UT) capsule Take 2,000 Units by mouth daily.   02/26/2024 Morning   cyanocobalamin  (VITAMIN B12) 1000 MCG tablet Take 1,000 mcg by mouth daily.   02/26/2024 Morning   HYDROcodone -acetaminophen  (NORCO) 10-325 MG tablet Take 1-2 tablets by mouth every 6 (six) hours as needed for moderate pain (pain score 4-6) or severe pain (pain score 7-10).   Unknown   levothyroxine  (SYNTHROID ) 50 MCG tablet Take 1 tablet (50 mcg total) by mouth daily at 6 (six) AM. 30 tablet 0  02/26/2024 Morning   meclizine (ANTIVERT) 25 MG tablet Take 1 tablet by mouth 3 (three) times daily as needed for dizziness.   Unknown   metoprolol  tartrate (LOPRESSOR ) 25 MG tablet Take 25 mg by mouth daily.   02/26/2024 Morning   predniSONE  (DELTASONE ) 10 MG tablet Take 10 mg by mouth every Monday, Wednesday, and Friday.   02/26/2024 Morning   pregabalin  (LYRICA ) 50 MG capsule Take 50 mg by mouth 2 (two) times daily.   02/26/2024 Evening   amiodarone (PACERONE) 200 MG tablet Take 200 mg by mouth daily. (Patient not taking: Reported on 02/27/2024)   Not Taking   traZODone  (DESYREL ) 50 MG tablet Take 50 mg by mouth at bedtime as needed for sleep. (Patient not taking: Reported on 02/27/2024)   Not Taking   Social History   Socioeconomic History   Marital status: Widowed    Spouse name: [name]   Number of children: Not on file   Years of education: Not on file   Highest education level: Not on file  Occupational History   Not on file  Tobacco Use  Smoking status: Former    Current packs/day: 0.00    Average packs/day: 1 pack/day for 20.0 years (20.0 ttl pk-yrs)    Types: Cigarettes    Start date: 09/12/1981    Quit date: 09/12/2001    Years since quitting: 22.4   Smokeless tobacco: Never  Vaping Use   Vaping status: Never Used  Substance and Sexual Activity   Alcohol use: Yes    Alcohol/week: 3.0 standard drinks of alcohol    Types: 3 Glasses of wine per week    Comment: 3 glasses a wine a week   Drug use: No   Sexual activity: Yes  Other Topics Concern   Not on file  Social History Narrative   Not on file   Social Drivers of Health   Financial Resource Strain: Low Risk  (02/14/2024)   Received from [facility]   Overall Financial Resource Strain (CARDIA)    Difficulty of Paying Living Expenses: Not hard at all  Food Insecurity: No Food Insecurity (02/27/2024)   Hunger Vital Sign    Worried About Running Out of Food in the Last Year: Never true    Ran Out of Food  in the Last Year: Never true  Transportation Needs: No Transportation Needs (02/27/2024)   PRAPARE - Administrator, Civil Service (Medical): No    Lack of Transportation (Non-Medical): No  Physical Activity: Not on file  Stress: Not on file  Social Connections: Moderately Integrated (02/27/2024)   Social Connection and Isolation Panel    Frequency of Communication with Friends and Family: More than three times a week    Frequency of Social Gatherings with Friends and Family: Three times a week    Attends Religious Services: More than 4 times per year    Active Member of Clubs or Organizations: Yes    Attends Banker Meetings: More than 4 times per year    Marital Status: Widowed  Intimate Partner Violence: Unknown (02/27/2024)   Humiliation, Afraid, Rape, and Kick questionnaire    Fear of Current or Ex-Partner: Not on file    Emotionally Abused: Not on file    Physically Abused: No    Sexually Abused: Not on file    Family History  Problem Relation Age of Onset   Hypertension Mother    Stroke Mother    Stroke Father      Vitals:   02/28/24 2323 02/29/24 0320 02/29/24 0459 02/29/24 0746  BP: (!) 98/51 121/62  134/61  Pulse: 69 81  78  Resp: 19 16    Temp: (!) 97.5 F (36.4 C) 98.1 F (36.7 C)  (!) 97.5 F (36.4 C)  TempSrc:  Oral    SpO2: 95% 94%  90%  Weight:   105 kg   Height:        PHYSICAL EXAM General: Well-appearing elderly male, well nourished, in no acute distress. HEENT: Normocephalic and atraumatic. Neck: No JVD.   Lungs: Normal respiratory effort on room air.  Diminished breath sounds bilaterally Heart: Irregular, irregular, controlled rates. S1, S2, + Murmur Abdomen: Non-distended appearing.  Msk: Normal strength and tone for age. Extremities: Warm and well perfused. No clubbing, cyanosis. Wheeping, 2+ pedal edema bilaterally.  Neuro: Alert and oriented X 3. Psych: Answers questions appropriately.   Labs: Basic Metabolic  Panel: Recent Labs    02/28/24 0315 02/29/24 0239  NA 143 143  K 3.9 3.7  CL 102 100  CO2 32 34*  GLUCOSE 89 100*  BUN 20 22  CREATININE 1.04 1.34*  CALCIUM  8.5* 8.7*   Liver Function Tests: No results for input(s): AST, ALT, ALKPHOS, BILITOT, PROT, ALBUMIN  in the last 72 hours. No results for input(s): LIPASE, AMYLASE in the last 72 hours. CBC: Recent Labs    02/26/24 1750 02/28/24 0315  WBC 4.0 3.7*  NEUTROABS 3.5  --   HGB 10.0* 9.2*  HCT 32.7* 30.0*  MCV 115.5* 110.7*  PLT 231 244   Cardiac Enzymes: No results for input(s): CKTOTAL, CKMB, CKMBINDEX, TROPONINIHS in the last 72 hours. BNP: Recent Labs    02/26/24 1750  BNP 514.3*   D-Dimer: No results for input(s): DDIMER in the last 72 hours. Hemoglobin A1C: No results for input(s): HGBA1C in the last 72 hours. Fasting Lipid Panel: No results for input(s): CHOL, HDL, LDLCALC, TRIG, CHOLHDL, LDLDIRECT in the last 72 hours. Thyroid  Function Tests: No results for input(s): TSH, T4TOTAL, T3FREE, THYROIDAB in the last 72 hours.  Invalid input(s): FREET3 Anemia Panel: Recent Labs    02/28/24 0315 02/28/24 0902  VITAMINB12  --  376  FOLATE 9.0  --   FERRITIN 45  --   TIBC 291  --   IRON  52  --   RETICCTPCT 2.1  --      Radiology: US  Venous Img Lower Bilateral Result Date: 02/27/2024 EXAM: ULTRASOUND DUPLEX OF THE BILATERAL LOWER EXTREMITY VEINS TECHNIQUE: Duplex ultrasound using B-mode/gray scaled imaging and Doppler spectral analysis and color flow was obtained of the deep venous structures of the bilateral lower extremity. COMPARISON: None. CLINICAL HISTORY: Bilateral leg swelling, worse on right side. FINDINGS: Bilateral calf veins are suboptimally visualized. The visualized veins of the lower extremity are patent and free of echogenic thrombus. The veins demonstrate good compressibility with normal color flow study and spectral analysis. IMPRESSION: 1. No  evidence of DVT. Electronically signed by: Zadie Herter MD 02/27/2024 02:49 AM EDT RP Workstation: [id number]   DG Chest Portable 1 View Result Date: 02/27/2024 CLINICAL DATA:  eval for edema EXAM: PORTABLE CHEST 1 VIEW COMPARISON:  Chest x-ray 05/31/2023, CT chest 12/06/2022, chest x-ray 12/06/2022 FINDINGS: The heart and mediastinal contours are unchanged. Atherosclerotic plaque. Right base linear atelectasis versus scarring. No focal consolidation. No pulmonary edema. Question trace bilateral pleural effusions. No pneumothorax. No acute osseous abnormality. Cervical spine surgical hardware partially visualized. IMPRESSION: 1. Question trace bilateral pleural effusions. Recommend further evaluation with chest x-ray PA and lateral view of the chest. 2.  Aortic Atherosclerosis (ICD10-I70.0). Electronically Signed   By: Morgane  Naveau M.D.   On: 02/27/2024 01:56   ECHO TEE Result Date: 02/23/2024    TRANSESOPHOGEAL ECHO REPORT   Patient Name:   Francisco [name] Date of Exam: 02/22/2024 Medical Rec #:  [id number]          Height:       70.0 in Accession #:    [id number]         Weight:       230.0 lb Date of Birth:  05-06-1946          BSA:          2.215 m Patient Age:    78 years           BP:           119/73 mmHg Patient Gender: M                  HR:           81 bpm.  Exam Location:  [facility] Procedure: Transesophageal Echo, Cardiac Doppler and Color Doppler (Both            Spectral and Color Flow Doppler were utilized during procedure). Indications:     Persistent Afib I48.19  History:         Patient has prior history of Echocardiogram examinations, most                  recent 06/01/2023. Arrythmias:Atrial Fibrillation; Risk                  Factors:Hypertension.  Sonographer:     Broadus Canes Referring Phys:  [id number] Nadie Fiumara Diagnosing Phys: Joetta Mustache PROCEDURE: After discussion of the risks and benefits of a TEE, an informed consent was obtained from the patient. The transesophogeal probe  was passed without difficulty through the esophogus of the patient. Local oropharyngeal anesthetic was provided with Cetacaine . Sedation performed by different physician. The patient was monitored while under deep sedation. Image quality was excellent. The patient's vital signs; including heart rate, blood pressure, and oxygen saturation; remained stable throughout the procedure. The patient developed no complications during the procedure.  IMPRESSIONS  1. Left ventricular ejection fraction, by estimation, is 55 to 60%. The left ventricle has normal function.  2. Right ventricular systolic function is normal. The right ventricular size is moderately enlarged.  3. Left atrial size was mildly dilated. No left atrial/left atrial appendage thrombus was detected.  4. Right atrial size was severely dilated.  5. The mitral valve is normal in structure. Mild mitral valve regurgitation.  6. Tricuspid valve regurgitation is moderate to severe.  7. The aortic valve is tricuspid. Aortic valve regurgitation is not visualized. FINDINGS  Left Ventricle: Left ventricular ejection fraction, by estimation, is 55 to 60%. The left ventricle has normal function. The left ventricular internal cavity size was normal in size. Right Ventricle: The right ventricular size is moderately enlarged. No increase in right ventricular wall thickness. Right ventricular systolic function is normal. Left Atrium: Left atrial size was mildly dilated. No left atrial/left atrial appendage thrombus was detected. Right Atrium: Right atrial size was severely dilated. Pericardium: There is no evidence of pericardial effusion. Mitral Valve: The mitral valve is normal in structure. Mild mitral valve regurgitation. Tricuspid Valve: The tricuspid valve is normal in structure. Tricuspid valve regurgitation is moderate to severe. Aortic Valve: The aortic valve is tricuspid. Aortic valve regurgitation is not visualized. Pulmonic Valve: The pulmonic valve was normal  in structure. Pulmonic valve regurgitation is trivial. Aorta: The aortic root is normal in size and structure. IAS/Shunts: No atrial level shunt detected by color flow Doppler. Joetta Mustache Electronically signed by Joetta Mustache Signature Date/Time: 02/23/2024/1:21:37 PM    Final     ECHO TEE 02/22/2024  1. Left ventricular ejection fraction, by estimation, is 55 to 60%. The left ventricle has normal function.   2. Right ventricular systolic function is normal. The right ventricular size is moderately enlarged.   3. Left atrial size was mildly dilated. No left atrial/left atrial appendage thrombus was detected.   4. Right atrial size was severely dilated.   5. The mitral valve is normal in structure. Mild mitral valve regurgitation.   6. Tricuspid valve regurgitation is moderate to severe.   7. The aortic valve is tricuspid. Aortic valve regurgitation is not visualized.   TELEMETRY reviewed by me 02/29/2024: Atrial fibrillation rate 80-90s   EKG reviewed by me: atrial fibrillation, rate 50s  Data reviewed by me  02/29/2024: last 24h vitals tele labs imaging I/O hospitalist progress notes.  Principal Problem:   Acute on chronic diastolic CHF (congestive heart failure) (HCC) Active Problems:   Myasthenia gravis (HCC)   Macrocytic anemia   Essential hypertension   Paroxysmal atrial fibrillation (HCC)   Hypothyroidism   Parkinson disease (HCC)   Obesity (BMI 30-39.9)   Cellulitis    ASSESSMENT AND PLAN:  Francisco [name] is a 77 y.o. male  with a past medical history of persistent atrial fibrillation (recent successful TEE/DCCV on 06/12, HX ablation in 2023), hx paroxsymal SVT, chronic HFpEF (predominately right sided HF), Severe TR, moderate pulmonary hypertension, hypertension, hyperlipidemia, myasthenia gravis, COPD  who presented to the ED on 02/26/2024 for worsening lower extremity edema and weeping. Cardiology was consulted for further evaluation.   # Acute on chronic HFpEF #  Paroxsymal atrial fibrillation(recent successful TEE/DCCV on 06/12, HX ablation 2023) # Hypertension # Hyperlipidemia # Moderate Pulmonary Hypertension Patient presents with worsening LEE and weeping. TEE from 06/12 with pEF (55-60%), mild MR, moderate to severe TR. CXR with bilaterally pulmonary congestion and trace bilateral pleural effusions. BNP elevated at 514. Per tele in AF rates 70-80s -Continue IV lasix  40 mg twice daily. Closely monitor UOP, renal function. (At discharge will start PO torsemide  20 mg BID) -Monitor and replenish electrolytes for a goal K >4. Will avoid aggressive magnesium  replacement due to myasthenia gravis to avoid exacerbation.  -Continue spironolactone  25 mg daily.  -Continue low dose metoprolol  tartrate 12.5 mg twice daily. Start at low dose due to myasthenia gravis.  -Continue Farxiga 10 mg daily. (Copay $47.0) -ContinuEliquis  5 mg twice daily for stroke risk reduction.  -Continue Amio 400 mg BID for 10 days, then 200 mg daily for rate and rhythm control  -Consider initiating ARB if BP becomes elevated.  -Recommend wound care for lower extremities.  -Recommend patient move and get out of bed and at least sit in bedside recliner.   This patient's plan of care was discussed and created with Dr. Beau Bound and he is in agreement.  Signed: Creighton Doffing, PA-C  02/29/2024, 8:13 AM [facility]

## 2024-02-29 NOTE — Care Management Important Message (Signed)
 Important Message  Patient Details  Name: Francisco [name] MRN: [id number] Date of Birth: 24-May-1946   Important Message Given:  Yes - Medicare IM     Anise Kerns 02/29/2024, 1:26 PM

## 2024-02-29 NOTE — [place] FL2 (Signed)
 [place]  MEDICAID [id number] LEVEL OF CARE FORM     IDENTIFICATION  Patient Name: Francisco [name] Birthdate: 05/15/1946 Sex: male Admission Date (Current Location): 02/26/2024  [facility] and [place] Number:  Chiropodist and Address:  [facility], [address] [place], [place] [address]      Provider Number: [id number]  Attending Physician Name and Address:  Luna Salinas, MD  Relative Name and Phone Number:       Current Level of Care: Hospital Recommended Level of Care: Skilled Nursing Facility Prior Approval Number:    Date Approved/Denied:   PASRR Number: Manual review  Discharge Plan: SNF    Current Diagnoses: Patient Active Problem List   Diagnosis Date Noted   Acute on chronic diastolic CHF (congestive heart failure) (HCC) 02/27/2024   Acute diastolic CHF (congestive heart failure) (HCC) 02/27/2024   Cellulitis 02/27/2024   Stroke (HCC) 05/31/2023   Obesity (BMI 30-39.9) 05/31/2023   HLD (hyperlipidemia) 05/31/2023   Chronic diastolic CHF (congestive heart failure) (HCC) 05/31/2023   Hyperkalemia 05/31/2023   SVT (supraventricular tachycardia) (HCC) 12/30/2021   Hypothyroidism 12/30/2021   Parkinson disease (HCC) 12/30/2021   Elevated troponin 12/30/2021   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   Acquired thrombophilia (HCC) 01/13/2021   History of decompression of median nerve 01/01/2021   Paroxysmal atrial fibrillation (HCC) 10/06/2020   Carotid stenosis, right 09/17/2020   S/P cervical spinal fusion    Leukocytosis    Essential hypertension    Anemia of chronic disease    Postoperative pain    Neuropathic pain    Cervical myelopathy (HCC) 08/07/2020   Preop cardiovascular exam 07/17/2020   SOB (shortness of breath) on exertion 07/17/2020   Leg weakness, bilateral 07/13/2020   Aortic atherosclerosis (HCC) 07/09/2020   Body mass index (BMI) 34.0-34.9, adult 12/25/2019   Myalgia 10/30/2019   Lumbar  post-laminectomy syndrome 10/24/2019   PAD (peripheral artery disease) (HCC) 06/06/2019   CKD (chronic kidney disease) stage 3, GFR 30-59 ml/min (HCC) 04/29/2019   B12 deficiency 01/23/2019   Left arm numbness 02/28/2018   Neck pain 02/28/2018   Macrocytic anemia 02/02/2017   DDD (degenerative disc disease), cervical 02/02/2017   Hypothyroid 02/02/2017   MRSA (methicillin resistant staph aureus) culture positive 02/02/2017   Nocturnal hypoxia 02/02/2017   Senile purpura (HCC) 10/03/2016   Essential hypertension, benign 09/16/2016   Bilateral carotid artery disease (HCC) 09/16/2016   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/18/2016   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   History of prostate cancer 12/21/2015   Left carpal tunnel syndrome 11/11/2015   Kidney stone on left side 07/05/2015   Myasthenia gravis (HCC) 05/26/2015   Radiculitis 01/05/2015   Long-term use of high-risk medication 10/15/2014   Persistent cough 09/10/2014   Pure hypercholesterolemia 07/25/2014   Spinal stenosis, lumbar region, with neurogenic claudication 02/24/2014   Lumbosacral stenosis with neurogenic claudication 02/24/2014   COPD (chronic obstructive pulmonary disease) (HCC) 01/22/2014    Orientation RESPIRATION BLADDER Height & Weight     Self, Time, Situation, Place  Normal Incontinent, External catheter Weight: 231 lb 7.7 oz (105 kg) Height:  5' 10 (177.8 cm)  BEHAVIORAL SYMPTOMS/MOOD NEUROLOGICAL BOWEL NUTRITION STATUS     (Parkinson's) Continent Diet (2 gram sodium)  AMBULATORY STATUS COMMUNICATION OF NEEDS Skin   Limited Assist Verbally Skin abrasions, Other (Comment) (Erythema/redness, weeping. Vascular ulcers on both pretibials: Petroleum, ABD, gauze.)  Personal Care Assistance Level of Assistance              Functional Limitations Info  Sight, Hearing, Speech Sight Info: Adequate Hearing Info: Adequate Speech Info: Adequate    SPECIAL CARE FACTORS  FREQUENCY  PT (By licensed PT)     PT Frequency: 5 x week              Contractures Contractures Info: Not present    Additional Factors Info  Code Status, Allergies Code Status Info: DNR Allergies Info: Azithromycin, Codeine           Current Medications (02/29/2024):  This is the current hospital active medication list Current Facility-Administered Medications  Medication Dose Route Frequency Provider Last Rate Last Admin   acetaminophen  (TYLENOL ) tablet 650 mg  650 mg Oral Q6H PRN Agbata, Tochukwu, MD       Or   acetaminophen  (TYLENOL ) suppository 650 mg  650 mg Rectal Q6H PRN Agbata, Tochukwu, MD       amiodarone (PACERONE) tablet 400 mg  400 mg Oral BID Decoste, Gabriella, PA-C   400 mg at 02/29/24 9528   Followed by   Cecily Cohen ON 03/09/2024] amiodarone (PACERONE) tablet 200 mg  200 mg Oral Daily Decoste, Gabriella, PA-C       apixaban  (ELIQUIS ) tablet 5 mg  5 mg Oral BID Agbata, Tochukwu, MD   5 mg at 02/29/24 0959   azaTHIOprine  (IMURAN ) tablet 150 mg  150 mg Oral Daily Agbata, Tochukwu, MD   150 mg at 02/29/24 1001   carbidopa -levodopa  (SINEMET  IR) 25-100 MG per tablet immediate release 1 tablet  1 tablet Oral TID Agbata, Tochukwu, MD   1 tablet at 02/29/24 0959   cefTRIAXone (ROCEPHIN) 2 g in sodium chloride  0.9 % 100 mL IVPB  2 g Intravenous Q24H Patel, Kishan S, RPH 200 mL/hr at 02/29/24 1020 2 g at 02/29/24 1020   cholecalciferol  (VITAMIN D3) 25 MCG (1000 UNIT) tablet 2,000 Units  2,000 Units Oral Daily Agbata, Tochukwu, MD   2,000 Units at 02/29/24 1000   cyanocobalamin  (VITAMIN B12) tablet 1,000 mcg  1,000 mcg Oral Daily Agbata, Tochukwu, MD   1,000 mcg at 02/29/24 1000   dapagliflozin propanediol (FARXIGA) tablet 10 mg  10 mg Oral Daily Decoste, Gabriella, PA-C   10 mg at 02/28/24 1036   furosemide  (LASIX ) injection 40 mg  40 mg Intravenous Daily Amin, Sumayya, MD   40 mg at 02/29/24 1000   levothyroxine  (SYNTHROID ) tablet 50 mcg  50 mcg Oral Q0600 Agbata, Tochukwu,  MD   50 mcg at 02/29/24 0616   meclizine (ANTIVERT) tablet 25 mg  25 mg Oral TID PRN Agbata, Tochukwu, MD       metoprolol  tartrate (LOPRESSOR ) tablet 12.5 mg  12.5 mg Oral BID Decoste, Gabriella, PA-C   12.5 mg at 02/29/24 0959   morphine  (PF) 2 MG/ML injection 2 mg  2 mg Intravenous Q4H PRN Mansy, Jan A, MD   2 mg at 02/27/24 0505   ondansetron  (ZOFRAN ) tablet 4 mg  4 mg Oral Q6H PRN Agbata, Tochukwu, MD       Or   ondansetron  (ZOFRAN ) injection 4 mg  4 mg Intravenous Q6H PRN Agbata, Tochukwu, MD       polyethylene glycol (MIRALAX  / GLYCOLAX ) packet 17 g  17 g Oral Daily Amin, Sumayya, MD   17 g at 02/29/24 1002   predniSONE  (DELTASONE ) tablet 10 mg  10 mg Oral Q M,W,F Agbata, Tochukwu, MD   10 mg at  02/28/24 1035   sodium chloride  flush (NS) 0.9 % injection 3 mL  3 mL Intravenous Q12H Agbata, Tochukwu, MD   3 mL at 02/29/24 1002   sodium chloride  flush (NS) 0.9 % injection 3 mL  3 mL Intravenous PRN Agbata, Tochukwu, MD       spironolactone  (ALDACTONE ) tablet 25 mg  25 mg Oral Daily Decoste, Gabriella, PA-C   25 mg at 02/29/24 1610   traZODone  (DESYREL ) tablet 50 mg  50 mg Oral QHS PRN Agbata, Tochukwu, MD   50 mg at 02/28/24 2154     Discharge Medications: Please see discharge summary for a list of discharge medications.  Relevant Imaging Results:  Relevant Lab Results:   Additional Information SS#: [id number]  Odilia Bennett, LCSW

## 2024-02-29 NOTE — Progress Notes (Signed)
 Heart Failure Stewardship Pharmacy Note  PCP: Melchor Spoon, MD PCP-Cardiologist: None  HPI: Francisco [name] is a 78 y.o. male with paroxysmal A-fib on chronic anticoagulation therapy, stage III chronic kidney disease, chronic diastolic dysfunction CHF with last known LVEF of 55 to 60% from an echo TEE which was done 06/25, history of myasthenia gravis who presented with bilateral lower extremity edema and increased work of breathing with mild exertion. On admission, BNP was 514.3. Chest x-ray noted trace bilateral pleural effusions. Doppler negative for DVT.  Pertinent cardiac history: Echo in 05/2018 with LVEF >55% and grade II diastolic dysfunction. Low risk stress test in 07/2020. Echo at that time with LVEF 55-60% and normal diastolic function. Carotid stenting performed in 09/2020. AF ablation in 06/2022. Echo in 05/2023 with LVEF 60-65%. TEE 02/2024 noted LVEF 55-60% with severely RA dilation, mild MR, moderate to severe TR. Was cardioverted to sinus bradycardia. Patient did not take amiodarone after DCCV. Not taking metoprolol  PTA either.  Pertinent Lab Values: Creatinine  Date Value Ref Range Status  03/04/2014 0.99 0.60 - 1.30 mg/dL Final   Creatinine, Ser  Date Value Ref Range Status  02/29/2024 1.34 (H) 0.61 - 1.24 mg/dL Final   BUN  Date Value Ref Range Status  02/29/2024 22 8 - 23 mg/dL Final  14/78/2956 25 (H) 7 - 18 mg/dL Final   Potassium  Date Value Ref Range Status  02/29/2024 3.7 3.5 - 5.1 mmol/L Final  03/04/2014 3.9 3.5 - 5.1 mmol/L Final   Sodium  Date Value Ref Range Status  02/29/2024 143 135 - 145 mmol/L Final  03/04/2014 141 136 - 145 mmol/L Final   B Natriuretic Peptide  Date Value Ref Range Status  02/26/2024 514.3 (H) 0.0 - 100.0 pg/mL Final    Comment:    Performed at [facility], [address] [place], [place] [address]   Magnesium   Date Value Ref Range Status  05/31/2023 2.1 1.7 - 2.4 mg/dL Final    Comment:     Performed at [facility], [address] [place], [place] [address]   Hgb A1c MFr Bld  Date Value Ref Range Status  06/01/2023 5.5 4.8 - 5.6 % Final    Comment:    (NOTE)         Prediabetes: 5.7 - 6.4         Diabetes: >6.4         Glycemic control for adults with diabetes: <7.0    TSH  Date Value Ref Range Status  05/31/2023 0.989 0.350 - 4.500 uIU/mL Final    Comment:    Performed by a 3rd Generation assay with a functional sensitivity of <=0.01 uIU/mL. Performed at [facility], [address] [place], [place] [address]     Vital Signs:  Temp:  [97.5 F (36.4 C)-98.6 F (37 C)] 97.5 F (36.4 C) (06/19 0746) Pulse Rate:  [69-97] 78 (06/19 0746) Cardiac Rhythm: Atrial fibrillation (06/19 0700) Resp:  [16-19] 16 (06/19 0320) BP: (98-134)/(51-70) 134/61 (06/19 0746) SpO2:  [90 %-97 %] 90 % (06/19 0746) Weight:  [105 kg (231 lb 7.7 oz)] 105 kg (231 lb 7.7 oz) (06/19 0459)  Intake/Output Summary (Last 24 hours) at 02/29/2024 1111 Last data filed at 02/29/2024 0938 Gross per 24 hour  Intake 3 ml  Output 3950 ml  Net -3947 ml   Current Heart Failure Medications:  Loop diuretic: furosemide  40 mg IV BID Beta-Blocker: metoprolol  tartrate 12.5 mg BID ACEI/ARB/ARNI: none MRA:  spironolactone  25 mg daily SGLT2i: Farxiga 10 mg daily Other: none  Prior to admission Heart Failure Medications:  Loop diuretic: torsemide  20 mg daily Beta-Blocker: metoprolol  succinate 25 mg daily (not taking prior to admission because he reports a doctor told him to stop it) ACEI/ARB/ARNI: none MRA: spironolactone  25 mg daily SGLT2i: Farxiga 10 mg daily Other: none  Assessment: 1. Acute on chronic diastolic heart failure (LVEF 55-60%)  , due to NICM. NYHA class III-IV symptoms.  -Symptoms: Reports feeling much better. Has more energy today. Breathing is unchanged. LEE still present, though appears to be improving.  -Volume: Hypervolemic on exam. LEE is improving.  Currently on furosemide  40 mg IV daily with good urine output. Creatinine bumped and CO2 is elevated. Urine color is still clear. Continue I -Hemodynamics: BP is mildly elevated and HR is controlled. No ECG this admission noted. -SGLT2i: Farxiga 10 mg daily restarted. -MRA: Continue spironolactone  25 mg daily -ARNI: BP elevated, can consider ARB in the future if it remains up.  -With myasthenia gravis, beta blockade can exacerbate symptoms. This is seen less with non-DHP CCBs and amiodarone. Recommend lowest effective dose possible to maintain HR goal.  Plan: 1) Medication changes recommended at this time: -Can consider changing metoprolol  to diltiazem  2) Patient assistance: -Pending  3) Education: - Patient has been educated on current HF medications and potential additions to HF medication regimen - Patient verbalizes understanding that over the next few months, these medication doses may change and more medications may be added to optimize HF regimen - Patient has been educated on basic disease state pathophysiology and goals of therapy  Medication Assistance / Insurance Benefits Check: Does the patient have prescription insurance?    Type of insurance plan:  Does the patient qualify for medication assistance through manufacturers or grants? Pending   Outpatient Pharmacy: Prior to admission outpatient pharmacy: [facility]      Please do not hesitate to reach out with questions or concerns,  Bevely Brush, PharmD, CPP, BCPS Heart Failure Pharmacist  Phone - [phone] 02/29/2024 11:11 AM

## 2024-03-01 DIAGNOSIS — I5033 Acute on chronic diastolic (congestive) heart failure: Secondary | ICD-10-CM | POA: Diagnosis not present

## 2024-03-01 LAB — BASIC METABOLIC PANEL WITH GFR
Anion gap: 10 (ref 5–15)
BUN: 24 mg/dL — ABNORMAL HIGH (ref 8–23)
CO2: 34 mmol/L — ABNORMAL HIGH (ref 22–32)
Calcium: 8.2 mg/dL — ABNORMAL LOW (ref 8.9–10.3)
Chloride: 99 mmol/L (ref 98–111)
Creatinine, Ser: 1.37 mg/dL — ABNORMAL HIGH (ref 0.61–1.24)
GFR, Estimated: 52 mL/min — ABNORMAL LOW (ref >60)
Glucose, Bld: 103 mg/dL — ABNORMAL HIGH (ref 70–99)
Potassium: 3.4 mmol/L — ABNORMAL LOW (ref 3.5–5.1)
Sodium: 143 mmol/L (ref 135–145)

## 2024-03-01 LAB — PHOSPHORUS: Phosphorus: 3.3 mg/dL (ref 2.5–4.6)

## 2024-03-01 LAB — MAGNESIUM: Magnesium: 1.7 mg/dL (ref 1.7–2.4)

## 2024-03-01 LAB — VITAMIN D 25 HYDROXY (VIT D DEFICIENCY, FRACTURES): Vit D, 25-Hydroxy: 48.54 ng/mL (ref 30–100)

## 2024-03-01 MED ORDER — POTASSIUM CHLORIDE CRYS ER 20 MEQ PO TBCR
40.0000 meq | EXTENDED_RELEASE_TABLET | Freq: Two times a day (BID) | ORAL | Status: AC
  Administered 2024-03-01 (×2): 40 meq via ORAL
  Filled 2024-03-01 (×2): qty 2

## 2024-03-01 MED ORDER — TORSEMIDE 20 MG PO TABS
20.0000 mg | ORAL_TABLET | Freq: Two times a day (BID) | ORAL | Status: DC
  Administered 2024-03-02 – 2024-03-03 (×4): 20 mg via ORAL
  Filled 2024-03-01 (×4): qty 1

## 2024-03-01 NOTE — Progress Notes (Signed)
 [facility] Progress Note  Patient: Francisco [name]    CZY:[id number]  DOA: 02/26/2024     Date of Service: the patient was seen and examined on 03/01/2024  Chief Complaint  Patient presents with   Leg Swelling   Brief hospital course: Francisco [name] is a 78 y.o. male with medical history significant for paroxysmal A-fib on chronic anticoagulation therapy, stage III chronic kidney disease, chronic diastolic dysfunction CHF with last known LVEF of 55 to 60% from an echo TEE which was done 06/25, history of myasthenia gravis who presents to the emergency room for evaluation of bilateral lower extremity swelling. Patient is wheelchair-bound but notes that he has had worsening lower extremity swelling over the last several days despite taking his diuretics.  No shortness of breath or PND.  Patient was not compliant with his dietary restrictions.   On presentation vital stable, labs with BNP of 514, hemoglobin 10, bicarb 33 CXR with trace bilateral pleural effusions.EKG with A-fib. Bilateral lower extremity venous Doppler negative for DVT   Patient was admitted for IV diuresis.   Assessment and Plan:  # Acute on chronic diastolic CHF (congestive heart failure) Patient presents for evaluation of bilateral lower extremity swelling despite taking his diuretics as prescribed. Imaging shows trace bilateral pleural effusion, BNP elevated at 514. S/p IV Lasix , d/c;d on 6/20 due to elevated creatinine -Daily weight and BMP -Strict intake and output Cardiology consulted, started torsemide  from 6/21 Continued Aldactone  25 mg p.o. daily    Paroxysmal atrial fibrillation (HCC) Heart rate in 90s.  Remain in A-fib. Cardiology restarted amiodarone-apparently he has stopped taking his home meds. Continue with Eliquis  and metoprolol    Cellulitis Patient has bilateral lower extremity weeping edema with areas of redness bilaterally over the anterior tibia and differential warmth Lower  extremity venous Doppler negative for DVT Treat empirically with Rocephin Elevate lower extremity   Essential hypertension Blood pressure is stable Monitor closely   Myasthenia gravis (HCC) Continue prednisone  and azathioprine  Supportive care   Hypothyroidism Continue Synthroid    Parkinson disease (HCC) - Continue home Sinemet    Macrocytic anemia Hemoglobin of 9.2 with MCV of 110. Anemia panel with normal iron  and low normal folate, B12 levels pending. - Continue to monitor and supplement as needed  Vitamin B12 level 376, goal >400, started B12 oral supplement.   Body mass index is 32.71 kg/m.  Interventions:  Diet: 2 g sodium diet DVT Prophylaxis: Therapeutic Anticoagulation with Eliquis    Advance goals of care discussion: DNR-intervene  Family Communication: family was present at bedside, at the time of interview.  The pt provided permission to discuss medical plan with the family. Opportunity was given to ask question and all questions were answered satisfactorily.   Disposition:  Pt is from Home, admitted with CHF s/p IV lasix , clinically stable, medically optimized and cleared by cardiology. Patient is medically stable to discharge. Discharge to SNF, when bed will be available. Follow TOC for disposition plan  Subjective: No significant events overnight, patient feels improvement in the lower extremity edema, denied any chest pain or palpitation, no shortness of breath.  Physical Exam: General: NAD, lying comfortably Appear in no distress, affect appropriate Eyes: PERRLA ENT: Oral Mucosa Clear, moist  Neck: no JVD,  Cardiovascular: S1 and S2 Present, no Murmur,  Respiratory: good respiratory effort, Bilateral Air entry equal and Decreased, no Crackles, no wheezes Abdomen: Bowel Sound present, Soft and no tenderness,  Skin: no rashes Extremities: 2+ Pedal edema, no calf tenderness Neurologic: without any  new focal findings Gait not checked due to patient  safety concerns  Vitals:   03/01/24 0341 03/01/24 0500 03/01/24 0726 03/01/24 1147  BP: 102/60  109/64 121/74  Pulse: 72  79 79  Resp: 18     Temp: 98.5 F (36.9 C)  97.8 F (36.6 C) 97.9 F (36.6 C)  TempSrc: Oral     SpO2: 96%  94% 96%  Weight:  103.4 kg    Height:        Intake/Output Summary (Last 24 hours) at 03/01/2024 1455 Last data filed at 03/01/2024 1426 Gross per 24 hour  Intake 340 ml  Output --  Net 340 ml   Filed Weights   02/28/24 0518 02/29/24 0459 03/01/24 0500  Weight: 108.9 kg 105 kg 103.4 kg    Data Reviewed: I have personally reviewed and interpreted daily labs, tele strips, imagings as discussed above. I reviewed all nursing notes, pharmacy notes, vitals, pertinent old records I have discussed plan of care as described above with RN and patient/family.  CBC: Recent Labs  Lab 02/26/24 1750 02/28/24 0315  WBC 4.0 3.7*  NEUTROABS 3.5  --   HGB 10.0* 9.2*  HCT 32.7* 30.0*  MCV 115.5* 110.7*  PLT 231 244   Basic Metabolic Panel: Recent Labs  Lab 02/26/24 1750 02/28/24 0315 02/29/24 0239 03/01/24 0251 03/01/24 0820  NA 145 143 143 143  --   K 4.4 3.9 3.7 3.4*  --   CL 105 102 100 99  --   CO2 33* 32 34* 34*  --   GLUCOSE 130* 89 100* 103*  --   BUN 23 20 22  24*  --   CREATININE 1.17 1.04 1.34* 1.37*  --   CALCIUM  8.7* 8.5* 8.7* 8.2*  --   MG  --   --   --   --  1.7  PHOS  --   --   --   --  3.3    Studies: No results found.  Scheduled Meds:  amiodarone  400 mg Oral BID   Followed by   Francisco [name] ON 03/09/2024] amiodarone  200 mg Oral Daily   apixaban   5 mg Oral BID   azaTHIOprine   150 mg Oral Daily   carbidopa -levodopa   1 tablet Oral TID   cholecalciferol   2,000 Units Oral Daily   cyanocobalamin   1,000 mcg Oral Daily   dapagliflozin propanediol  10 mg Oral Daily   levothyroxine   50 mcg Oral Q0600   metoprolol  tartrate  12.5 mg Oral BID   polyethylene glycol  17 g Oral Daily   potassium chloride   40 mEq Oral BID   predniSONE    10 mg Oral Q M,W,F   sodium chloride  flush  3 mL Intravenous Q12H   spironolactone   25 mg Oral Daily   [START ON 03/02/2024] torsemide   20 mg Oral BID   Continuous Infusions:  cefTRIAXone (ROCEPHIN)  IV 2 g (03/01/24 1029)   PRN Meds: acetaminophen  **OR** acetaminophen , meclizine, morphine  injection, ondansetron  **OR** ondansetron  (ZOFRAN ) IV, sodium chloride  flush, traZODone   Time spent: 35 minutes  Author: Althia Atlas. MD [facility] 03/01/2024 2:55 PM  To reach On-call, see care teams to locate the attending and reach out to them via [email or web address]. If 7PM-7AM, please contact night-coverage If you still have difficulty reaching the attending provider, please page the [facility] (Director on Call) for [facility] on amion for assistance.

## 2024-03-01 NOTE — Progress Notes (Signed)
 Physical Therapy Treatment Patient Details Name: Francisco [name] MRN: [id number] DOB: [birth year] Today's Date: 03/01/2024   History of Present Illness 78 y.o. male with medical history significant for paroxysmal A-fib on chronic anticoagulation therapy, stage III chronic kidney disease, chronic diastolic dysfunction CHF with last known LVEF of 55 to 60% from an echo TEE which was done 06/25, history of myasthenia gravis who presents to the emergency room for evaluation of bilateral lower extremity swelling.    PT Comments  Pt resting in bed upon PT arrival; pt agreeable to therapy; pt's 2 daughter's stepped out during session.  Pt reporting 0/10 pain at rest beginning/end of session but increased chronic back pain reported during activity.  During session pt was max assist with bed mobility; max assist to stand from bed up to RW; and CGA to take x5 steps in place B LE's and to side step to R along bed a few feet (with RW use).  Pt appearing tired in general during session and fatigued with sessions activities.  Pt assisted back to bed to rest end of session.  Will continue to focus on strengthening, endurance, and progressive functional mobility during hospitalization.   If plan is discharge home, recommend the following: A little help with bathing/dressing/bathroom;Help with stairs or ramp for entrance;Assist for transportation;Assistance with cooking/housework;A lot of help with walking and/or transfers   Can travel by private vehicle     No  Equipment Recommendations  None recommended by PT    Recommendations for Other Services       Precautions / Restrictions Precautions Precautions: Fall Recall of Precautions/Restrictions: Intact Restrictions Weight Bearing Restrictions Per Provider Order: No     Mobility  Bed Mobility Overal bed mobility: Needs Assistance Bed Mobility: Supine to Sit; Sit to supine     Supine to sit: Max assist  Sit to supine: Max assist   General bed  mobility comments: assist for trunk and B LE's; use of bed rail; vc's for technique    Transfers Overall transfer level: Needs assistance Equipment used: Rolling walker (2 wheels) Transfers: Sit to/from Stand Sit to Stand: Max assist           General transfer comment: x1 trial standing from regular bed height; vc's for UE placement; pt able to get hips off of bed but needed assist to come to full standing; assist to control descent sitting    Ambulation/Gait Ambulation/Gait assistance: Contact guard assist Gait Distance (Feet):  (x5 steps in place B LE's; able to side step to R along bed a few steps with RW use) Assistive device: Rolling walker (2 wheels)   Gait velocity: decreased     General Gait Details: increased effort/time to take steps; decreased B LE foot clearance   Stairs             Wheelchair Mobility     Tilt Bed    Modified Rankin (Stroke Patients Only)       Balance Overall balance assessment: Needs assistance Sitting-balance support: No upper extremity supported, Feet supported Sitting balance-Leahy Scale: Good Sitting balance - Comments: steady reaching within BOS   Standing balance support: Bilateral upper extremity supported, Reliant on assistive device for balance Standing balance-Leahy Scale: Fair Standing balance comment: steady static balance with increased UE support on RW                            Communication Communication Communication: No apparent difficulties  Cognition Arousal:  Alert Behavior During Therapy: WFL for tasks assessed/performed   PT - Cognitive impairments: No apparent impairments                         Following commands: Intact      Cueing Cueing Techniques: Verbal cues  Exercises      General Comments  Nursing cleared pt for participation in physical therapy.  Pt agreeable to PT session.      Pertinent Vitals/Pain Pain Assessment Pain Assessment: 0-10 Pain Score:   (0/10 at rest beginning/end of session; 6/10 with activity) Pain Location: chronic back pain Pain Descriptors / Indicators: Grimacing, Guarding, Aching, Discomfort Pain Intervention(s): Limited activity within patient's tolerance, Monitored during session, Repositioned VSS (HR and SpO2 sats on room air) during session.    Home Living                          Prior Function            PT Goals (current goals can now be found in the care plan section) Acute Rehab PT Goals Patient Stated Goal: get stonger to go home PT Goal Formulation: With patient Time For Goal Achievement: 03/12/24 Potential to Achieve Goals: Good Progress towards PT goals: Progressing toward goals    Frequency    Min 2X/week      PT Plan      Co-evaluation              AM-PAC PT 6 Clicks Mobility   Outcome Measure  Help needed turning from your back to your side while in a flat bed without using bedrails?: A Little Help needed moving from lying on your back to sitting on the side of a flat bed without using bedrails?: A Lot Help needed moving to and from a bed to a chair (including a wheelchair)?: A Little Help needed standing up from a chair using your arms (e.g., wheelchair or bedside chair)?: A Lot Help needed to walk in hospital room?: A Lot Help needed climbing 3-5 steps with a railing? : Total 6 Click Score: 13    End of Session Equipment Utilized During Treatment: Gait belt Activity Tolerance: Patient tolerated treatment well;Patient limited by fatigue Patient left: in bed;with call bell/phone within reach;with bed alarm set;with family/visitor present Nurse Communication: Mobility status;Precautions;Other (comment) (pt reporting feeling like he has had something in his R eye for the past few days (nursing attended to pt)) PT Visit Diagnosis: Muscle weakness (generalized) (M62.81);Difficulty in walking, not elsewhere classified (R26.2);Unsteadiness on feet (R26.81)      Time: 1610-9604 PT Time Calculation (min) (ACUTE ONLY): 23 min  Charges:    $Therapeutic Activity: 23-37 mins PT General Charges $$ ACUTE PT VISIT: 1 Visit                     Amador Junes, PT 03/01/24, 4:31 PM

## 2024-03-01 NOTE — TOC Progression Note (Signed)
 Transition of Care [facility]) - Progression Note    Patient Details  Name: Francisco [name] MRN: [id number] Date of Birth: February 21, 1946  Transition of Care [facility]) CM/SW Contact  Baird Bombard, RN Phone Number: 03/01/2024, 2:09 PM  Clinical Narrative:    Spoke with patient at bedside to give bed offers for [facility], [place] , [facility], [facility], [address] [place], [place]. Patient would like a list to compare facilities prior to making a decision. List provided.       Expected Discharge Plan: Home w Home Health Services Barriers to Discharge: Continued Medical Work up  Expected Discharge Plan and Services   Discharge Planning Services: CM Consult Post Acute Care Choice: Home Health Living arrangements for the past 2 months: Single Family Home                                       Social Determinants of Health (SDOH) Interventions SDOH Screenings   Food Insecurity: No Food Insecurity (02/27/2024)  Housing: Low Risk  (02/27/2024)  Transportation Needs: No Transportation Needs (02/27/2024)  Utilities: Not At Risk (02/27/2024)  Depression (PHQ2-9): Low Risk  (08/30/2021)  Financial Resource Strain: Low Risk  (02/14/2024)   Received from [facility]  Social Connections: Moderately Integrated (02/27/2024)  Tobacco Use: Medium Risk (02/26/2024)    Readmission Risk Interventions     No data to display

## 2024-03-01 NOTE — Plan of Care (Signed)
   Problem: Education: Goal: Ability to demonstrate management of disease process will improve Outcome: Progressing Goal: Ability to verbalize understanding of medication therapies will improve Outcome: Progressing Goal: Individualized Educational Video(s) Outcome: Progressing   Problem: Activity: Goal: Capacity to carry out activities will improve Outcome: Progressing   Problem: Cardiac: Goal: Ability to achieve and maintain adequate cardiopulmonary perfusion will improve Outcome: Progressing   Problem: Education: Goal: Knowledge of General Education information will improve Description: Including pain rating scale, medication(s)/side effects and non-pharmacologic comfort measures Outcome: Progressing   Problem: Health Behavior/Discharge Planning: Goal: Ability to manage health-related needs will improve Outcome: Progressing   Problem: Clinical Measurements: Goal: Ability to maintain clinical measurements within normal limits will improve Outcome: Progressing Goal: Will remain free from infection Outcome: Progressing Goal: Diagnostic test results will improve Outcome: Progressing Goal: Respiratory complications will improve Outcome: Progressing Goal: Cardiovascular complication will be avoided Outcome: Progressing   Problem: Activity: Goal: Risk for activity intolerance will decrease Outcome: Progressing   Problem: Nutrition: Goal: Adequate nutrition will be maintained Outcome: Progressing   Problem: Coping: Goal: Level of anxiety will decrease Outcome: Progressing   Problem: Elimination: Goal: Will not experience complications related to bowel motility Outcome: Progressing Goal: Will not experience complications related to urinary retention Outcome: Progressing   Problem: Pain Managment: Goal: General experience of comfort will improve and/or be controlled Outcome: Progressing   Problem: Safety: Goal: Ability to remain free from injury will  improve Outcome: Progressing   Problem: Skin Integrity: Goal: Risk for impaired skin integrity will decrease Outcome: Progressing

## 2024-03-01 NOTE — Progress Notes (Signed)
 [facility] CLINIC CARDIOLOGY PROGRESS NOTE       Patient ID: Francisco [name] MRN: [id number] DOB/AGE: Sep 22, 1945 78 y.o.  Admit date: 02/26/2024 Referring Physician Dr. Broadus Canes Primary Physician Melchor Spoon, MD Primary Cardiologist Dr. Bob Burn Reason for Consultation [name]  HPI: Francisco [name] is a 77 y.o. male  with a past medical history of persistent atrial fibrillation (recent successful TEE/DCCV on 06/12, HX ablation in 2023), hx paroxsymal SVT, chronic HFpEF (predominately right sided HF), Severe TR, moderate pulmonary hypertension, hypertension, hyperlipidemia, myasthenia gravis, COPD  who presented to the ED on 02/26/2024 for worsening lower extremity edema and weeping. Cardiology was consulted for further evaluation.   Interval History:  -Patient seen and examined this AM and sitting in bedside chair. Patient states he feels good this AM. LEE much improved. Denies chest pain, SOB or orthopnea.  -Patients BP and HR stable this AM. Per tele in AF with rates 70-80s -Yesterday UOP 2.5L. Cr 1.34 > 1.37. Urine color dark this AM. Will d/c IV lasix .  -Patient remains on room air with stable SpO2.   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   Carotid stenosis    CHF (congestive heart failure) (HCC)    CKD (chronic kidney disease)    CKD (chronic kidney disease) stage 3, GFR 30-59 ml/min (HCC)    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   DDD (degenerative disc disease), cervical    Dysrhythmia    post carotid stent bradycardia; PAF 09/2020   GERD (gastroesophageal reflux disease)     Hypercholesterolemia    Hypertension    Hypothyroidism    pt takes Levothyroxine  daily   Lumbosacral spinal stenosis    Myasthenia gravis, adult form (HCC)    PAD (peripheral artery disease) (HCC)    Parkinson disease (HCC)    Shortness of breath    Lung MD- Dr Coolidge Dent   Sleep apnea    do not use CPAP every night   Stroke (HCC) 05/31/2023    Past Surgical History:  Procedure Laterality Date   ANTERIOR CERVICAL DECOMP/DISCECTOMY FUSION  07/18/2011   Procedure: ANTERIOR CERVICAL DECOMPRESSION/DISCECTOMY FUSION 2 LEVELS;  Surgeon: Awilda Bogus Pool;  Location: [facility];  Service: Neurosurgery;  Laterality: N/A;  cervical five-six, cervical six-seven anterior cervical discectomy and fusion   ATRIAL FIBRILLATION ABLATION     BACK SURGERY     in 1985 [facility]   BILATERAL CARPAL TUNNEL RELEASE     01/2020 Right, 04/2020 Left   CARDIAC CATHETERIZATION     2005 at [facility], no stents   CARDIOVERSION N/A 02/22/2024   Procedure: CARDIOVERSION;  Surgeon: Alluri, Odessa Bene, MD;  Location: [facility] ORS;  Service: Cardiovascular;  Laterality: N/A;   CAROTID PTA/STENT INTERVENTION N/A 09/17/2020   Procedure: CAROTID PTA/STENT INTERVENTION;  Surgeon: Celso College, MD;  Location: [facility] INVASIVE CV LAB;  Service: Cardiovascular;  Laterality: N/A;   CATARACT EXTRACTION W/PHACO Left 01/06/2020   Procedure: CATARACT EXTRACTION PHACO AND INTRAOCULAR LENS PLACEMENT (IOC) ISTENT INJ LEFT 3.81  00:33.3;  Surgeon: Rosa College, MD;  Location: [facility];  Service: Ophthalmology;  Laterality: Left;   CATARACT EXTRACTION W/PHACO Right 02/03/2020   Procedure: CATARACT EXTRACTION PHACO AND INTRAOCULAR LENS PLACEMENT (IOC) RIGHT ISTENT INJ;  Surgeon: Rosa College, MD;  Location: [facility];  Service: Ophthalmology;  Laterality: Right;  4.29 0:35.6   COLONOSCOPY     HERNIA REPAIR Left    inguinal hernia repair in 1985   LUMBAR LAMINECTOMY/DECOMPRESSION MICRODISCECTOMY Left 02/24/2014    Procedure: LUMBAR LAMINECTOMY/DECOMPRESSION MICRODISCECTOMY LUMBAR THREE-FOUR, FOUR-FIVE, LEFT FIVE-SACRAL ONE ;  Surgeon: Baruch Bosch, MD;  Location: [facility];  Service: Neurosurgery;  Laterality: Left;  LUMBAR LAMINECTOMY/DECOMPRESSION MICRODISCECTOMY LUMBAR THREE-FOUR, FOUR-FIVE, LEFT FIVE-SACRAL ONE    LUMBAR LAMINECTOMY/DECOMPRESSION MICRODISCECTOMY N/A 05/03/2021   Procedure: Laminectomy and Foraminotomy - L2-L3;  Surgeon: Agustina Aldrich, MD;  Location: [facility] OR;  Service: Neurosurgery;  Laterality: N/A;  3C   POSTERIOR CERVICAL FUSION/FORAMINOTOMY N/A 08/07/2020   Procedure: C3-6 POSTERIOR FUSION WITH DECOMPRESSION;  Surgeon: Jodeen Munch, MD;  Location: [facility] ORS;  Service: Neurosurgery;  Laterality: N/A;   PROSTATECTOMY  04/2013   [facility] Dr Kathlene Paradise    TEE WITHOUT CARDIOVERSION N/A 02/22/2024   Procedure: ECHOCARDIOGRAM, TRANSESOPHAGEAL;  Surgeon: Janette Medley, MD;  Location: [facility] ORS;  Service: Cardiovascular;  Laterality: N/A;    Medications Prior to Admission  Medication Sig Dispense Refill Last Dose/Taking   apixaban  (ELIQUIS ) 5 MG TABS tablet Take 5 mg by mouth 2 (two) times daily.   02/26/2024 at  8:00 PM   azaTHIOprine  (IMURAN ) 50 MG tablet Take 150 mg by mouth daily.    02/26/2024 at  9:00 AM   carbidopa -levodopa  (SINEMET  IR) 25-100 MG tablet Take 1 tablet by mouth 3 (three) times daily.   02/26/2024 at  8:00 PM   Cholecalciferol  (D3-1000) 25 MCG (1000 UT) capsule Take 2,000 Units by mouth daily.   02/26/2024 Morning   cyanocobalamin  (VITAMIN B12) 1000 MCG tablet Take 1,000 mcg by mouth daily.   02/26/2024 Morning   HYDROcodone -acetaminophen  (NORCO) 10-325 MG tablet Take 1-2 tablets by mouth every 6 (six) hours as needed for moderate pain (pain score 4-6) or severe pain (pain score 7-10).   Unknown   levothyroxine  (SYNTHROID ) 50 MCG tablet Take 1 tablet (50 mcg total) by mouth daily at 6 (six) AM. 30 tablet 0 02/26/2024 Morning   meclizine (ANTIVERT) 25 MG tablet Take 1  tablet by mouth 3 (three) times daily as needed for dizziness.   Unknown   metoprolol  tartrate (LOPRESSOR ) 25 MG tablet Take 25 mg by mouth daily.   02/26/2024 Morning   predniSONE  (DELTASONE ) 10 MG tablet Take 10 mg by mouth every Monday, Wednesday, and Friday.   02/26/2024 Morning   pregabalin  (LYRICA ) 50 MG capsule Take 50 mg by mouth 2 (two) times daily.   02/26/2024 Evening   amiodarone (PACERONE) 200 MG tablet Take 200 mg by mouth daily. (Patient not taking: Reported on 02/27/2024)   Not Taking   traZODone  (DESYREL ) 50 MG tablet Take 50 mg by mouth at bedtime as needed for sleep. (Patient not taking: Reported on 02/27/2024)   Not Taking   Social History   Socioeconomic History   Marital status: Widowed    Spouse name: [name]   Number of children: Not on file   Years of education: Not on file   Highest education level: Not on file  Occupational History   Not on file  Tobacco Use   Smoking status: Former  Current packs/day: 0.00    Average packs/day: 1 pack/day for 20.0 years (20.0 ttl pk-yrs)    Types: Cigarettes    Start date: 09/12/1981    Quit date: 09/12/2001    Years since quitting: 22.4   Smokeless tobacco: Never  Vaping Use   Vaping status: Never Used  Substance and Sexual Activity   Alcohol use: Yes    Alcohol/week: 3.0 standard drinks of alcohol    Types: 3 Glasses of wine per week    Comment: 3 glasses a wine a week   Drug use: No   Sexual activity: Yes  Other Topics Concern   Not on file  Social History Narrative   Not on file   Social Drivers of Health   Financial Resource Strain: Low Risk  (02/14/2024)   Received from [facility]   Overall Financial Resource Strain (CARDIA)    Difficulty of Paying Living Expenses: Not hard at all  Food Insecurity: No Food Insecurity (02/27/2024)   Hunger Vital Sign    Worried About Running Out of Food in the Last Year: Never true    Ran Out of Food in the Last Year: Never true  Transportation Needs: No  Transportation Needs (02/27/2024)   PRAPARE - Administrator, Civil Service (Medical): No    Lack of Transportation (Non-Medical): No  Physical Activity: Not on file  Stress: Not on file  Social Connections: Moderately Integrated (02/27/2024)   Social Connection and Isolation Panel    Frequency of Communication with Friends and Family: More than three times a week    Frequency of Social Gatherings with Friends and Family: Three times a week    Attends Religious Services: More than 4 times per year    Active Member of Clubs or Organizations: Yes    Attends Banker Meetings: More than 4 times per year    Marital Status: Widowed  Intimate Partner Violence: Unknown (02/27/2024)   Humiliation, Afraid, Rape, and Kick questionnaire    Fear of Current or Ex-Partner: Not on file    Emotionally Abused: Not on file    Physically Abused: No    Sexually Abused: Not on file    Family History  Problem Relation Age of Onset   Hypertension Mother    Stroke Mother    Stroke Father      Vitals:   03/01/24 0341 03/01/24 0500 03/01/24 0726 03/01/24 1147  BP: 102/60  109/64 121/74  Pulse: 72  79 79  Resp: 18     Temp: 98.5 F (36.9 C)  97.8 F (36.6 C) 97.9 F (36.6 C)  TempSrc: Oral     SpO2: 96%  94% 96%  Weight:  103.4 kg    Height:        PHYSICAL EXAM General: Well-appearing elderly male, well nourished, in no acute distress. HEENT: Normocephalic and atraumatic. Neck: No JVD.   Lungs: Normal respiratory effort on room air.  Diminished breath sounds bilaterally (improving) Heart: Irregular, irregular, controlled rates. S1, S2, + Murmur Abdomen: Non-distended appearing.  Msk: Normal strength and tone for age. Extremities: Warm and well perfused. No clubbing, cyanosis. Wheeping, Trace pedal edema bilaterally.  Neuro: Alert and oriented X 3. Psych: Answers questions appropriately.   Labs: Basic Metabolic Panel: Recent Labs    02/29/24 0239 03/01/24 0251  03/01/24 0820  NA 143 143  --   K 3.7 3.4*  --   CL 100 99  --   CO2 34* 34*  --  GLUCOSE 100* 103*  --   BUN 22 24*  --   CREATININE 1.34* 1.37*  --   CALCIUM  8.7* 8.2*  --   MG  --   --  1.7  PHOS  --   --  3.3   Liver Function Tests: No results for input(s): AST, ALT, ALKPHOS, BILITOT, PROT, ALBUMIN  in the last 72 hours. No results for input(s): LIPASE, AMYLASE in the last 72 hours. CBC: Recent Labs    02/28/24 0315  WBC 3.7*  HGB 9.2*  HCT 30.0*  MCV 110.7*  PLT 244   Cardiac Enzymes: No results for input(s): CKTOTAL, CKMB, CKMBINDEX, TROPONINIHS in the last 72 hours. BNP: No results for input(s): BNP in the last 72 hours.  D-Dimer: No results for input(s): DDIMER in the last 72 hours. Hemoglobin A1C: No results for input(s): HGBA1C in the last 72 hours. Fasting Lipid Panel: No results for input(s): CHOL, HDL, LDLCALC, TRIG, CHOLHDL, LDLDIRECT in the last 72 hours. Thyroid  Function Tests: No results for input(s): TSH, T4TOTAL, T3FREE, THYROIDAB in the last 72 hours.  Invalid input(s): FREET3 Anemia Panel: Recent Labs    02/28/24 0315 02/28/24 0902  VITAMINB12  --  376  FOLATE 9.0  --   FERRITIN 45  --   TIBC 291  --   IRON  52  --   RETICCTPCT 2.1  --      Radiology: US  Venous Img Lower Bilateral Result Date: 02/27/2024 EXAM: ULTRASOUND DUPLEX OF THE BILATERAL LOWER EXTREMITY VEINS TECHNIQUE: Duplex ultrasound using B-mode/gray scaled imaging and Doppler spectral analysis and color flow was obtained of the deep venous structures of the bilateral lower extremity. COMPARISON: None. CLINICAL HISTORY: Bilateral leg swelling, worse on right side. FINDINGS: Bilateral calf veins are suboptimally visualized. The visualized veins of the lower extremity are patent and free of echogenic thrombus. The veins demonstrate good compressibility with normal color flow study and spectral analysis. IMPRESSION: 1. No evidence  of DVT. Electronically signed by: Zadie Herter MD 02/27/2024 02:49 AM EDT RP Workstation: [id number]   DG Chest Portable 1 View Result Date: 02/27/2024 CLINICAL DATA:  eval for edema EXAM: PORTABLE CHEST 1 VIEW COMPARISON:  Chest x-ray 05/31/2023, CT chest 12/06/2022, chest x-ray 12/06/2022 FINDINGS: The heart and mediastinal contours are unchanged. Atherosclerotic plaque. Right base linear atelectasis versus scarring. No focal consolidation. No pulmonary edema. Question trace bilateral pleural effusions. No pneumothorax. No acute osseous abnormality. Cervical spine surgical hardware partially visualized. IMPRESSION: 1. Question trace bilateral pleural effusions. Recommend further evaluation with chest x-ray PA and lateral view of the chest. 2.  Aortic Atherosclerosis (ICD10-I70.0). Electronically Signed   By: Morgane  Naveau M.D.   On: 02/27/2024 01:56   ECHO TEE Result Date: 02/23/2024    TRANSESOPHOGEAL ECHO REPORT   Patient Name:   LESHAWN STRAKA Date of Exam: 02/22/2024 Medical Rec #:  [id number]          Height:       70.0 in Accession #:    [id number]         Weight:       230.0 lb Date of Birth:  02/24/1946          BSA:          2.215 m Patient Age:    78 years           BP:           119/73 mmHg Patient Gender: M  HR:           81 bpm. Exam Location:  [facility] Procedure: Transesophageal Echo, Cardiac Doppler and Color Doppler (Both            Spectral and Color Flow Doppler were utilized during procedure). Indications:     Persistent Afib I48.19  History:         Patient has prior history of Echocardiogram examinations, most                  recent 06/01/2023. Arrythmias:Atrial Fibrillation; Risk                  Factors:Hypertension.  Sonographer:     Broadus Canes Referring Phys:  [id number] Margery Szostak Diagnosing Phys: Joetta Mustache PROCEDURE: After discussion of the risks and benefits of a TEE, an informed consent was obtained from the patient. The transesophogeal probe was  passed without difficulty through the esophogus of the patient. Local oropharyngeal anesthetic was provided with Cetacaine . Sedation performed by different physician. The patient was monitored while under deep sedation. Image quality was excellent. The patient's vital signs; including heart rate, blood pressure, and oxygen saturation; remained stable throughout the procedure. The patient developed no complications during the procedure.  IMPRESSIONS  1. Left ventricular ejection fraction, by estimation, is 55 to 60%. The left ventricle has normal function.  2. Right ventricular systolic function is normal. The right ventricular size is moderately enlarged.  3. Left atrial size was mildly dilated. No left atrial/left atrial appendage thrombus was detected.  4. Right atrial size was severely dilated.  5. The mitral valve is normal in structure. Mild mitral valve regurgitation.  6. Tricuspid valve regurgitation is moderate to severe.  7. The aortic valve is tricuspid. Aortic valve regurgitation is not visualized. FINDINGS  Left Ventricle: Left ventricular ejection fraction, by estimation, is 55 to 60%. The left ventricle has normal function. The left ventricular internal cavity size was normal in size. Right Ventricle: The right ventricular size is moderately enlarged. No increase in right ventricular wall thickness. Right ventricular systolic function is normal. Left Atrium: Left atrial size was mildly dilated. No left atrial/left atrial appendage thrombus was detected. Right Atrium: Right atrial size was severely dilated. Pericardium: There is no evidence of pericardial effusion. Mitral Valve: The mitral valve is normal in structure. Mild mitral valve regurgitation. Tricuspid Valve: The tricuspid valve is normal in structure. Tricuspid valve regurgitation is moderate to severe. Aortic Valve: The aortic valve is tricuspid. Aortic valve regurgitation is not visualized. Pulmonic Valve: The pulmonic valve was normal in  structure. Pulmonic valve regurgitation is trivial. Aorta: The aortic root is normal in size and structure. IAS/Shunts: No atrial level shunt detected by color flow Doppler. Joetta Mustache Electronically signed by Joetta Mustache Signature Date/Time: 02/23/2024/1:21:37 PM    Final     ECHO TEE 02/22/2024  1. Left ventricular ejection fraction, by estimation, is 55 to 60%. The left ventricle has normal function.   2. Right ventricular systolic function is normal. The right ventricular size is moderately enlarged.   3. Left atrial size was mildly dilated. No left atrial/left atrial appendage thrombus was detected.   4. Right atrial size was severely dilated.   5. The mitral valve is normal in structure. Mild mitral valve regurgitation.   6. Tricuspid valve regurgitation is moderate to severe.   7. The aortic valve is tricuspid. Aortic valve regurgitation is not visualized.   TELEMETRY reviewed by me 03/01/2024: Atrial fibrillation rate 70-80s  EKG  reviewed by me: atrial fibrillation, rate 50s  Data reviewed by me 03/01/2024: last 24h vitals tele labs imaging I/O hospitalist progress notes.  Principal Problem:   Acute on chronic diastolic CHF (congestive heart failure) (HCC) Active Problems:   Myasthenia gravis (HCC)   Macrocytic anemia   Essential hypertension   Paroxysmal atrial fibrillation (HCC)   Hypothyroidism   Parkinson disease (HCC)   Obesity (BMI 30-39.9)   Cellulitis    ASSESSMENT AND PLAN:  SEANN GENTHER is a 78 y.o. male  with a past medical history of persistent atrial fibrillation (recent successful TEE/DCCV on 06/12, HX ablation in 2023), hx paroxsymal SVT, chronic HFpEF (predominately right sided HF), Severe TR, moderate pulmonary hypertension, hypertension, hyperlipidemia, myasthenia gravis, COPD  who presented to the ED on 02/26/2024 for worsening lower extremity edema and weeping. Cardiology was consulted for further evaluation.   # Acute on chronic HFpEF #  Paroxsymal atrial fibrillation(recent successful TEE/DCCV on 06/12, HX ablation 2023) # Hypertension # Hyperlipidemia # Moderate Pulmonary Hypertension Patient presents with worsening LEE and weeping. TEE from 06/12 with pEF (55-60%), mild MR, moderate to severe TR. CXR with bilaterally pulmonary congestion and trace bilateral pleural effusions. BNP elevated at 514. Per tele in AF rates 70-80s -D/C IV lasix . Start PO torsemide  20 mg BID tomorrow. Closely monitor UOP, renal function. -Monitor and replenish electrolytes for a goal K >4. Will avoid aggressive magnesium  replacement due to myasthenia gravis to avoid exacerbation.  -Continue spironolactone  25 mg daily.  -Continue low dose metoprolol  tartrate 12.5 mg twice daily. Start at low dose due to myasthenia gravis.  -Continue Farxiga 10 mg daily. (Copay $47.0) -ContinuEliquis  5 mg twice daily for stroke risk reduction.  -Continue Amio 400 mg BID for 10 days, then 200 mg daily for rate and rhythm control  -Consider initiating ARB if BP becomes elevated.  -Recommend wound care for lower extremities.  -Recommend patient move and get out of bed and at least sit in bedside recliner.   Patient awaiting SNF placement. Ok for discharge today from a cardiac perspective. Will arrange for follow up in clinic with Dr. Bob Burn in 2 weeks. Heart Failure appointment within 1 week.    This patient's plan of care was discussed and created with Dr. Beau Bound and he is in agreement.  Signed: Creighton Doffing, PA-C  03/01/2024, 12:23 PM [facility] Cardiology

## 2024-03-01 NOTE — Progress Notes (Signed)
 Heart Failure Stewardship Pharmacy Note  PCP: Melchor Spoon, MD PCP-Cardiologist: None  HPI: Francisco [name] is a 78 y.o. male with paroxysmal A-fib on chronic anticoagulation therapy, stage III chronic kidney disease, chronic diastolic dysfunction CHF with last known LVEF of 55 to 60% from an echo TEE which was done 06/25, history of myasthenia gravis who presented with bilateral lower extremity edema and increased work of breathing with mild exertion. On admission, BNP was 514.3. Chest x-ray noted trace bilateral pleural effusions. Doppler negative for DVT.  Pertinent cardiac history: Echo in 05/2018 with LVEF >55% and grade II diastolic dysfunction. Low risk stress test in 07/2020. Echo at that time with LVEF 55-60% and normal diastolic function. Carotid stenting performed in 09/2020. AF ablation in 06/2022. Echo in 05/2023 with LVEF 60-65%. TEE 02/2024 noted LVEF 55-60% with severely RA dilation, mild MR, moderate to severe TR. Was cardioverted to sinus bradycardia. Patient did not take amiodarone after DCCV. Not taking metoprolol  PTA either.  Pertinent Lab Values: Creatinine  Date Value Ref Range Status  03/04/2014 0.99 0.60 - 1.30 mg/dL Final   Creatinine, Ser  Date Value Ref Range Status  03/01/2024 1.37 (H) 0.61 - 1.24 mg/dL Final   BUN  Date Value Ref Range Status  03/01/2024 24 (H) 8 - 23 mg/dL Final  52/84/1324 25 (H) 7 - 18 mg/dL Final   Potassium  Date Value Ref Range Status  03/01/2024 3.4 (L) 3.5 - 5.1 mmol/L Final  03/04/2014 3.9 3.5 - 5.1 mmol/L Final   Sodium  Date Value Ref Range Status  03/01/2024 143 135 - 145 mmol/L Final  03/04/2014 141 136 - 145 mmol/L Final   B Natriuretic Peptide  Date Value Ref Range Status  02/26/2024 514.3 (H) 0.0 - 100.0 pg/mL Final    Comment:    Performed at [facility], [address] [place], [place] [address]   Magnesium   Date Value Ref Range Status  03/01/2024 1.7 1.7 - 2.4 mg/dL Final    Comment:     Performed at [facility], [address] [place], [place] [address]   Hgb A1c MFr Bld  Date Value Ref Range Status  06/01/2023 5.5 4.8 - 5.6 % Final    Comment:    (NOTE)         Prediabetes: 5.7 - 6.4         Diabetes: >6.4         Glycemic control for adults with diabetes: <7.0    TSH  Date Value Ref Range Status  05/31/2023 0.989 0.350 - 4.500 uIU/mL Final    Comment:    Performed by a 3rd Generation assay with a functional sensitivity of <=0.01 uIU/mL. Performed at [facility], [address] [place], [place] [address]     Vital Signs:  Temp:  [97.5 F (36.4 C)-98.7 F (37.1 C)] 97.8 F (36.6 C) (06/20 0726) Pulse Rate:  [72-86] 79 (06/20 0726) Cardiac Rhythm: Atrial fibrillation (06/20 0731) Resp:  [18-20] 18 (06/20 0341) BP: (98-109)/(53-79) 109/64 (06/20 0726) SpO2:  [93 %-96 %] 94 % (06/20 0726) Weight:  [103.4 kg (227 lb 15.3 oz)] 103.4 kg (227 lb 15.3 oz) (06/20 0500)  Intake/Output Summary (Last 24 hours) at 03/01/2024 1003 Last data filed at 02/29/2024 1900 Gross per 24 hour  Intake 460 ml  Output 1100 ml  Net -640 ml   Current Heart Failure Medications:  Loop diuretic: furosemide  40 mg IV BID Beta-Blocker: metoprolol  tartrate 12.5 mg BID ACEI/ARB/ARNI:  none MRA: spironolactone  25 mg daily SGLT2i: Farxiga 10 mg daily Other: none  Prior to admission Heart Failure Medications:  Loop diuretic: torsemide  20 mg daily Beta-Blocker: metoprolol  succinate 25 mg daily (not taking prior to admission because he reports a doctor told him to stop it) ACEI/ARB/ARNI: none MRA: spironolactone  25 mg daily SGLT2i: Farxiga 10 mg daily Other: none  Assessment: 1. Acute on chronic diastolic heart failure (LVEF 55-60%)  , due to NICM. NYHA class III-IV symptoms.  -Symptoms: Reports feeling worse today due to lack of energy. Breathing is fine. LEE is much improved.  -Volume: Hypovolemia likely leading to more lethargy. Urine color is very  dark.Currently on furosemide  40 mg IV daily and urine output has slowed. Creatinine and CO2 bumped. Consider holding furosemide  today and starting oral torsemide  tomorrow. -Hemodynamics: BP is mildly elevated and HR is controlled. No ECG this admission noted. -SGLT2i: Farxiga 10 mg daily restarted. -MRA: Continue spironolactone  25 mg daily -ARNI: BP stable, nor need for RAASi at this time. -With myasthenia gravis, beta blockade can exacerbate symptoms. This is seen less with non-DHP CCBs and amiodarone. Recommend lowest effective dose possible to maintain HR goal.  Plan: 1) Medication changes recommended at this time: -Consider holding furosemide  today -Can consider changing metoprolol  to diltiazem  2) Patient assistance: -Pending  3) Education: - Patient has been educated on current HF medications and potential additions to HF medication regimen - Patient verbalizes understanding that over the next few months, these medication doses may change and more medications may be added to optimize HF regimen - Patient has been educated on basic disease state pathophysiology and goals of therapy  Medication Assistance / Insurance Benefits Check: Does the patient have prescription insurance?    Type of insurance plan:  Does the patient qualify for medication assistance through manufacturers or grants? Pending   Outpatient Pharmacy: Prior to admission outpatient pharmacy: [facility]      Please do not hesitate to reach out with questions or concerns,  Bevely Brush, PharmD, CPP, BCPS Heart Failure Pharmacist  Phone - [phone] 03/01/2024 10:03 AM

## 2024-03-02 DIAGNOSIS — I5033 Acute on chronic diastolic (congestive) heart failure: Secondary | ICD-10-CM | POA: Diagnosis not present

## 2024-03-02 LAB — BASIC METABOLIC PANEL WITH GFR
Anion gap: 9 (ref 5–15)
BUN: 19 mg/dL (ref 8–23)
CO2: 28 mmol/L (ref 22–32)
Calcium: 8.6 mg/dL — ABNORMAL LOW (ref 8.9–10.3)
Chloride: 104 mmol/L (ref 98–111)
Creatinine, Ser: 1.08 mg/dL (ref 0.61–1.24)
GFR, Estimated: >60 mL/min (ref >60)
Glucose, Bld: 100 mg/dL — ABNORMAL HIGH (ref 70–99)
Potassium: 4.4 mmol/L (ref 3.5–5.1)
Sodium: 141 mmol/L (ref 135–145)

## 2024-03-02 LAB — CBC
HCT: 34.6 % — ABNORMAL LOW (ref 39.0–52.0)
Hemoglobin: 10.7 g/dL — ABNORMAL LOW (ref 13.0–17.0)
MCH: 34.9 pg — ABNORMAL HIGH (ref 26.0–34.0)
MCHC: 30.9 g/dL (ref 30.0–36.0)
MCV: 112.7 fL — ABNORMAL HIGH (ref 80.0–100.0)
Platelets: 266 10*3/uL (ref 150–400)
RBC: 3.07 MIL/uL — ABNORMAL LOW (ref 4.22–5.81)
RDW: 16.4 % — ABNORMAL HIGH (ref 11.5–15.5)
WBC: 5.8 10*3/uL (ref 4.0–10.5)
nRBC: 0.7 % — ABNORMAL HIGH (ref 0.0–0.2)

## 2024-03-02 LAB — MAGNESIUM: Magnesium: 2 mg/dL (ref 1.7–2.4)

## 2024-03-02 LAB — PHOSPHORUS: Phosphorus: 2.6 mg/dL (ref 2.5–4.6)

## 2024-03-02 MED ORDER — MELATONIN 5 MG PO TABS
5.0000 mg | ORAL_TABLET | Freq: Every day | ORAL | Status: DC
  Administered 2024-03-02 – 2024-03-05 (×4): 5 mg via ORAL
  Filled 2024-03-02 (×4): qty 1

## 2024-03-02 NOTE — Progress Notes (Signed)
 Patient ID: Francisco [name], male   DOB: 1946/02/07, 78 y.o.   MRN: [id number] [facility]    SUBJECTIVE: Patient states to be doing reasonably well lying in bed denies any pain shortness of breath reasonable denies palpitations or tachycardia   Vitals:   03/01/24 2327 03/02/24 0400 03/02/24 0459 03/02/24 0730  BP: 125/74 128/67  129/67  Pulse: 78 91  76  Resp: 18 20    Temp: 97.7 F (36.5 C) 98.3 F (36.8 C)  98 F (36.7 C)  TempSrc: Oral Oral    SpO2: 96% 93%  95%  Weight:   100.7 kg   Height:         Intake/Output Summary (Last 24 hours) at 03/02/2024 9046 Last data filed at 03/02/2024 9050 Gross per 24 hour  Intake 240 ml  Output 1800 ml  Net -1560 ml      PHYSICAL EXAM  General: Well developed, well nourished, in no acute distress HEENT:  Normocephalic and atramatic Neck:  No JVD.  Lungs: Clear bilaterally to auscultation and percussion. Heart: Irregular irregular heavy.. Normal S1 and S2 without gallops or murmurs.  Abdomen: Bowel sounds are positive, abdomen soft and non-tender  Msk:  Back normal, normal gait. Normal strength and tone for age. Extremities: No clubbing, cyanosis or edema.   Neuro: Alert and oriented X 3. Psych:  Good affect, responds appropriately   LABS: Basic Metabolic Panel: Recent Labs    03/01/24 0251 03/01/24 0820 03/02/24 0518  NA 143  --  141  K 3.4*  --  4.4  CL 99  --  104  CO2 34*  --  28  GLUCOSE 103*  --  100*  BUN 24*  --  19  CREATININE 1.37*  --  1.08  CALCIUM  8.2*  --  8.6*  MG  --  1.7 2.0  PHOS  --  3.3 2.6   Liver Function Tests: No results for input(s): AST, ALT, ALKPHOS, BILITOT, PROT, ALBUMIN  in the last 72 hours. No results for input(s): LIPASE, AMYLASE in the last 72 hours. CBC: Recent Labs    03/02/24 0518  WBC 5.8  HGB 10.7*  HCT 34.6*  MCV 112.7*  PLT 266   Cardiac Enzymes: No results for input(s): CKTOTAL, CKMB, CKMBINDEX, TROPONINI in the last 72  hours. BNP: Invalid input(s): POCBNP D-Dimer: No results for input(s): DDIMER in the last 72 hours. Hemoglobin A1C: No results for input(s): HGBA1C in the last 72 hours. Fasting Lipid Panel: No results for input(s): CHOL, HDL, LDLCALC, TRIG, CHOLHDL, LDLDIRECT in the last 72 hours. Thyroid  Function Tests: No results for input(s): TSH, T4TOTAL, T3FREE, THYROIDAB in the last 72 hours.  Invalid input(s): FREET3 Anemia Panel: No results for input(s): VITAMINB12, FOLATE, FERRITIN, TIBC, IRON , RETICCTPCT in the last 72 hours.  No results found.   Echo preserved left ventricular function EF of 55 to 60%  TELEMETRY: Atrial fibrillation rate of 80 nonspecific findings:  ASSESSMENT AND PLAN:  Principal Problem:   Acute on chronic diastolic CHF (congestive heart failure) (HCC) Active Problems:   Myasthenia gravis (HCC)   Macrocytic anemia   Essential hypertension   Paroxysmal atrial fibrillation (HCC)   Hypothyroidism   Parkinson disease (HCC)   Obesity (BMI 30-39.9)   Cellulitis    Plan Atrial fibrillation recent TEE cardioversion now with recurrent atrial fibrillation had an ablation in 2023 continue medical therapy History of SVT reasonably stable Acute on chronic congestive heart failure continue medical management patient much improved less shortness of  breath Moderate pulmonary hypertension recommend inhalers diuretics pulmonary input as necessary HFpEF continue metoprolol  spironolactone  Farxiga  consider ACE or ARB History of myasthenia gravis of the patient follow-up with rheumatology around neurology Continue supportive management and care   Cara JONETTA Lovelace, MD 03/02/2024 9:53 AM

## 2024-03-02 NOTE — Plan of Care (Signed)
  Problem: Activity: Goal: Capacity to carry out activities will improve Outcome: Progressing   Problem: Education: Goal: Knowledge of General Education information will improve Description: Including pain rating scale, medication(s)/side effects and non-pharmacologic comfort measures Outcome: Progressing   Problem: Health Behavior/Discharge Planning: Goal: Ability to manage health-related needs will improve Outcome: Progressing   Problem: Clinical Measurements: Goal: Ability to maintain clinical measurements within normal limits will improve Outcome: Progressing Goal: Cardiovascular complication will be avoided Outcome: Progressing   Problem: Activity: Goal: Risk for activity intolerance will decrease Outcome: Progressing   Problem: Coping: Goal: Level of anxiety will decrease Outcome: Progressing   Problem: Elimination: Goal: Will not experience complications related to bowel motility Outcome: Progressing   Problem: Skin Integrity: Goal: Risk for impaired skin integrity will decrease Outcome: Progressing

## 2024-03-02 NOTE — Progress Notes (Signed)
 [facility] Progress Note  Patient: Francisco [name]    FMW:[id number]  DOA: 02/26/2024     Date of Service: the patient was seen and examined on 03/02/2024  Chief Complaint  Patient presents with   Leg Swelling   Brief hospital course: Francisco [name] is a 78 y.o. male with medical history significant for paroxysmal A-fib on chronic anticoagulation therapy, stage III chronic kidney disease, chronic diastolic dysfunction CHF with last known LVEF of 55 to 60% from an echo TEE which was done 06/25, history of myasthenia gravis who presents to the emergency room for evaluation of bilateral lower extremity swelling. Patient is wheelchair-bound but notes that he has had worsening lower extremity swelling over the last several days despite taking his diuretics.  No shortness of breath or PND.  Patient was not compliant with his dietary restrictions.   On presentation vital stable, labs with BNP of 514, hemoglobin 10, bicarb 33 CXR with trace bilateral pleural effusions.EKG with A-fib. Bilateral lower extremity venous Doppler negative for DVT   Patient was admitted for IV diuresis.   Assessment and Plan:  # Acute on chronic diastolic CHF (congestive heart failure) Patient presents for evaluation of bilateral lower extremity swelling despite taking his diuretics as prescribed. Imaging shows trace bilateral pleural effusion, BNP elevated at 514. S/p IV Lasix , d/c;d on 6/20 due to elevated creatinine -Daily weight and BMP -Strict intake and output Cardiology consulted, started torsemide  from 6/21 Continued Aldactone  25 mg p.o. daily 6/21 sCr 1.08 improved    Paroxysmal atrial fibrillation (HCC) Continue to monitor on telemetry Cardiology restarted amiodarone -apparently he has stopped taking his home meds. Continue with Eliquis  and metoprolol    Cellulitis Patient has bilateral lower extremity weeping edema with areas of redness bilaterally over the anterior tibia and  differential warmth Lower extremity venous Doppler negative for DVT Treat empirically with Rocephin  Elevate lower extremity   Essential hypertension Blood pressure is stable Monitor closely   Myasthenia gravis (HCC) Continue prednisone  and azathioprine  Supportive care   Hypothyroidism Continue Synthroid    Parkinson disease (HCC) - Continue home Sinemet    Macrocytic anemia Hemoglobin of 9.2 with MCV of 110. Anemia panel with normal iron  and low normal folate, B12 levels pending. - Continue to monitor and supplement as needed  Vitamin B12 level 376, goal >400, started B12 oral supplement.   Body mass index is 31.85 kg/m.  Interventions:  Diet: 2 g sodium diet DVT Prophylaxis: Therapeutic Anticoagulation with Eliquis    Advance goals of care discussion: DNR-intervene  Family Communication: family was present at bedside, at the time of interview.  The pt provided permission to discuss medical plan with the family. Opportunity was given to ask question and all questions were answered satisfactorily.   Disposition:  Pt is from Home, admitted with CHF s/p IV lasix , clinically stable, medically optimized and cleared by cardiology. Patient is medically stable to discharge. Discharge to SNF, when bed will be available. Follow TOC for disposition plan  Subjective: No significant events overnight, patient was sleepy, stated that he did not sleep well last night, patient was given trazodone  but that did not help.  Denied any other active issues, no chest pain or palpitation, no shortness of breath. Patient is making enough urine.   Physical Exam: General: NAD, lying comfortably Appear in no distress, affect appropriate Eyes: PERRLA ENT: Oral Mucosa Clear, moist  Neck: no JVD,  Cardiovascular: S1 and S2 Present, no Murmur,  Respiratory: good respiratory effort, Bilateral Air entry equal and Decreased, no  Crackles, no wheezes Abdomen: Bowel Sound present, Soft and no  tenderness,  Skin: no rashes Extremities: 1-2+ Pedal edema, no calf tenderness Neurologic: without any new focal findings Gait not checked due to patient safety concerns  Vitals:   03/02/24 0400 03/02/24 0459 03/02/24 0730 03/02/24 1125  BP: 128/67  129/67 108/63  Pulse: 91  76 73  Resp: 20     Temp: 98.3 F (36.8 C)  98 F (36.7 C) 98.6 F (37 C)  TempSrc: Oral     SpO2: 93%  95% 96%  Weight:  100.7 kg    Height:        Intake/Output Summary (Last 24 hours) at 03/02/2024 1356 Last data filed at 03/02/2024 1318 Gross per 24 hour  Intake 240 ml  Output 2900 ml  Net -2660 ml   Filed Weights   02/29/24 0459 03/01/24 0500 03/02/24 0459  Weight: 105 kg 103.4 kg 100.7 kg    Data Reviewed: I have personally reviewed and interpreted daily labs, tele strips, imagings as discussed above. I reviewed all nursing notes, pharmacy notes, vitals, pertinent old records I have discussed plan of care as described above with RN and patient/family.  CBC: Recent Labs  Lab 02/26/24 1750 02/28/24 0315 03/02/24 0518  WBC 4.0 3.7* 5.8  NEUTROABS 3.5  --   --   HGB 10.0* 9.2* 10.7*  HCT 32.7* 30.0* 34.6*  MCV 115.5* 110.7* 112.7*  PLT 231 244 266   Basic Metabolic Panel: Recent Labs  Lab 02/26/24 1750 02/28/24 0315 02/29/24 0239 03/01/24 0251 03/01/24 0820 03/02/24 0518  NA 145 143 143 143  --  141  K 4.4 3.9 3.7 3.4*  --  4.4  CL 105 102 100 99  --  104  CO2 33* 32 34* 34*  --  28  GLUCOSE 130* 89 100* 103*  --  100*  BUN 23 20 22  24*  --  19  CREATININE 1.17 1.04 1.34* 1.37*  --  1.08  CALCIUM  8.7* 8.5* 8.7* 8.2*  --  8.6*  MG  --   --   --   --  1.7 2.0  PHOS  --   --   --   --  3.3 2.6    Studies: No results found.  Scheduled Meds:  amiodarone   400 mg Oral BID   Followed by   NOREEN ON 03/09/2024] amiodarone   200 mg Oral Daily   apixaban   5 mg Oral BID   azaTHIOprine   150 mg Oral Daily   carbidopa -levodopa   1 tablet Oral TID   cholecalciferol   2,000 Units Oral  Daily   cyanocobalamin   1,000 mcg Oral Daily   dapagliflozin  propanediol  10 mg Oral Daily   levothyroxine   50 mcg Oral Q0600   metoprolol  tartrate  12.5 mg Oral BID   polyethylene glycol  17 g Oral Daily   predniSONE   10 mg Oral Q M,W,F   sodium chloride  flush  3 mL Intravenous Q12H   spironolactone   25 mg Oral Daily   torsemide   20 mg Oral BID   Continuous Infusions:  cefTRIAXone  (ROCEPHIN )  IV 2 g (03/02/24 0945)   PRN Meds: acetaminophen  **OR** acetaminophen , meclizine, morphine  injection, ondansetron  **OR** ondansetron  (ZOFRAN ) IV, sodium chloride  flush, traZODone   Time spent: 35 minutes  Author: ELVAN SOR. MD [facility] 03/02/2024 1:56 PM  To reach On-call, see care teams to locate the attending and reach out to them via [email or web address]. If 7PM-7AM, please contact night-coverage If you still have difficulty  reaching the attending provider, please page the [facility] (Director on Call) for [facility] on amion for assistance.

## 2024-03-03 DIAGNOSIS — I5033 Acute on chronic diastolic (congestive) heart failure: Secondary | ICD-10-CM | POA: Diagnosis not present

## 2024-03-03 LAB — BASIC METABOLIC PANEL WITH GFR
Anion gap: 11 (ref 5–15)
BUN: 21 mg/dL (ref 8–23)
CO2: 31 mmol/L (ref 22–32)
Calcium: 8.8 mg/dL — ABNORMAL LOW (ref 8.9–10.3)
Chloride: 100 mmol/L (ref 98–111)
Creatinine, Ser: 1.42 mg/dL — ABNORMAL HIGH (ref 0.61–1.24)
GFR, Estimated: 50 mL/min — ABNORMAL LOW (ref >60)
Glucose, Bld: 106 mg/dL — ABNORMAL HIGH (ref 70–99)
Potassium: 4.4 mmol/L (ref 3.5–5.1)
Sodium: 142 mmol/L (ref 135–145)

## 2024-03-03 LAB — CBC
HCT: 35.2 % — ABNORMAL LOW (ref 39.0–52.0)
Hemoglobin: 11 g/dL — ABNORMAL LOW (ref 13.0–17.0)
MCH: 34.6 pg — ABNORMAL HIGH (ref 26.0–34.0)
MCHC: 31.3 g/dL (ref 30.0–36.0)
MCV: 110.7 fL — ABNORMAL HIGH (ref 80.0–100.0)
Platelets: 293 10*3/uL (ref 150–400)
RBC: 3.18 MIL/uL — ABNORMAL LOW (ref 4.22–5.81)
RDW: 16 % — ABNORMAL HIGH (ref 11.5–15.5)
WBC: 6 10*3/uL (ref 4.0–10.5)
nRBC: 0.3 % — ABNORMAL HIGH (ref 0.0–0.2)

## 2024-03-03 LAB — MAGNESIUM: Magnesium: 2 mg/dL (ref 1.7–2.4)

## 2024-03-03 LAB — PHOSPHORUS: Phosphorus: 3.4 mg/dL (ref 2.5–4.6)

## 2024-03-03 NOTE — Plan of Care (Signed)
  Problem: Education: Goal: Ability to demonstrate management of disease process will improve Outcome: Progressing Goal: Ability to verbalize understanding of medication therapies will improve Outcome: Progressing   Problem: Cardiac: Goal: Ability to achieve and maintain adequate cardiopulmonary perfusion will improve Outcome: Progressing   Problem: Pain Managment: Goal: General experience of comfort will improve and/or be controlled Outcome: Progressing   Problem: Safety: Goal: Ability to remain free from injury will improve Outcome: Progressing   Problem: Skin Integrity: Goal: Risk for impaired skin integrity will decrease Outcome: Progressing

## 2024-03-03 NOTE — Progress Notes (Signed)
 Patient ID: Francisco [name], male   DOB: [birth year], 78 y.o.   MRN: [id number] [facility]    SUBJECTIVE: Patient says she does reasonably well resting comfortably in bed no chest pain no shortness of breath denies palpitations or tachycardia still slightly hypotensive.  Has not had physical therapy today has been up with the nurses   Vitals:   03/02/24 2318 03/03/24 0317 03/03/24 0500 03/03/24 0715  BP: (!) 94/58 96/65  115/61  Pulse: 70 73  73  Resp: 18 20  19   Temp: 97.8 F (36.6 C) 98.3 F (36.8 C)  98.1 F (36.7 C)  TempSrc:      SpO2: 93% 95%  91%  Weight:   98.2 kg   Height:         Intake/Output Summary (Last 24 hours) at 03/03/2024 9043 Last data filed at 03/02/2024 2127 Gross per 24 hour  Intake 123 ml  Output 2400 ml  Net -2277 ml      PHYSICAL EXAM  General: Well developed, well nourished, in no acute distress HEENT:  Normocephalic and atramatic Neck:  No JVD.  Lungs: Clear bilaterally to auscultation and percussion. Heart: HRRR . Normal S1 and S2 without gallops or murmurs.  Abdomen: Bowel sounds are positive, abdomen soft and non-tender  Msk:  Back normal, normal gait. Normal strength and tone for age. Extremities: No clubbing, cyanosis or edema.   Neuro: Alert and oriented X 3. Psych:  Good affect, responds appropriately   LABS: Basic Metabolic Panel: Recent Labs    03/02/24 0518 03/03/24 0111  NA 141 142  K 4.4 4.4  CL 104 100  CO2 28 31  GLUCOSE 100* 106*  BUN 19 21  CREATININE 1.08 1.42*  CALCIUM  8.6* 8.8*  MG 2.0 2.0  PHOS 2.6 3.4   Liver Function Tests: No results for input(s): AST, ALT, ALKPHOS, BILITOT, PROT, ALBUMIN  in the last 72 hours. No results for input(s): LIPASE, AMYLASE in the last 72 hours. CBC: Recent Labs    03/02/24 0518 03/03/24 0111  WBC 5.8 6.0  HGB 10.7* 11.0*  HCT 34.6* 35.2*  MCV 112.7* 110.7*  PLT 266 293   Cardiac Enzymes: No results for input(s): CKTOTAL, CKMB, CKMBINDEX,  TROPONINI in the last 72 hours. BNP: Invalid input(s): POCBNP D-Dimer: No results for input(s): DDIMER in the last 72 hours. Hemoglobin A1C: No results for input(s): HGBA1C in the last 72 hours. Fasting Lipid Panel: No results for input(s): CHOL, HDL, LDLCALC, TRIG, CHOLHDL, LDLDIRECT in the last 72 hours. Thyroid  Function Tests: No results for input(s): TSH, T4TOTAL, T3FREE, THYROIDAB in the last 72 hours.  Invalid input(s): FREET3 Anemia Panel: No results for input(s): VITAMINB12, FOLATE, FERRITIN, TIBC, IRON , RETICCTPCT in the last 72 hours.  No results found.   Echo normal left ventricular function EF of 55 to 60%  TELEMETRY: Atrial fibrillation rate of 80 nonspecific findings:  ASSESSMENT AND PLAN:  Principal Problem:   Acute on chronic diastolic CHF (congestive heart failure) (HCC) Active Problems:   Myasthenia gravis (HCC)   Macrocytic anemia   Essential hypertension   Paroxysmal atrial fibrillation (HCC)   Hypothyroidism   Parkinson disease (HCC)   Obesity (BMI 30-39.9)   Cellulitis    Plan Acute on chronic diastolic congestive heart failure preserved left ventricular function currently on IV Lasix  BNP over 500 will monitor salt intake and fluid intake continue Aldactone  torsemide  Atrial fibrillation paroxysmal maintain telemetry restarted on amiodarone  continue Eliquis  and metoprolol  Hypertension reasonably controlled reasonably stable continue current  medical therapy History of myasthenia gravis on prednisone  azathioprine  follow-up with neurology or rheumatology Parkinson's still on Sinemet  for management and control Cellulitis continue broad-spectrum antibiotic therapy for possible cellulitis Obesity is modest recommend modest weight loss exercise portion control  Cara JONETTA Lovelace, MD 03/03/2024 9:56 AM

## 2024-03-03 NOTE — Plan of Care (Signed)
   Problem: Education: Goal: Ability to demonstrate management of disease process will improve Outcome: Progressing Goal: Ability to verbalize understanding of medication therapies will improve Outcome: Progressing Goal: Individualized Educational Video(s) Outcome: Progressing   Problem: Activity: Goal: Capacity to carry out activities will improve Outcome: Progressing   Problem: Cardiac: Goal: Ability to achieve and maintain adequate cardiopulmonary perfusion will improve Outcome: Progressing   Problem: Education: Goal: Knowledge of General Education information will improve Description: Including pain rating scale, medication(s)/side effects and non-pharmacologic comfort measures Outcome: Progressing   Problem: Health Behavior/Discharge Planning: Goal: Ability to manage health-related needs will improve Outcome: Progressing   Problem: Clinical Measurements: Goal: Ability to maintain clinical measurements within normal limits will improve Outcome: Progressing Goal: Will remain free from infection Outcome: Progressing Goal: Diagnostic test results will improve Outcome: Progressing Goal: Respiratory complications will improve Outcome: Progressing Goal: Cardiovascular complication will be avoided Outcome: Progressing   Problem: Activity: Goal: Risk for activity intolerance will decrease Outcome: Progressing   Problem: Nutrition: Goal: Adequate nutrition will be maintained Outcome: Progressing   Problem: Coping: Goal: Level of anxiety will decrease Outcome: Progressing   Problem: Elimination: Goal: Will not experience complications related to bowel motility Outcome: Progressing Goal: Will not experience complications related to urinary retention Outcome: Progressing   Problem: Pain Managment: Goal: General experience of comfort will improve and/or be controlled Outcome: Progressing   Problem: Safety: Goal: Ability to remain free from injury will  improve Outcome: Progressing   Problem: Skin Integrity: Goal: Risk for impaired skin integrity will decrease Outcome: Progressing

## 2024-03-03 NOTE — Progress Notes (Signed)
 [facility] Progress Note  Patient: Francisco [name]    FMW:[id number]  DOA: 02/26/2024     Date of Service: the patient was seen and examined on 03/03/2024  Chief Complaint  Patient presents with   Leg Swelling   Brief hospital course: Francisco [name] is a 78 y.o. male with medical history significant for paroxysmal A-fib on chronic anticoagulation therapy, stage III chronic kidney disease, chronic diastolic dysfunction CHF with last known LVEF of 55 to 60% from an echo TEE which was done 06/25, history of myasthenia gravis who presents to the emergency room for evaluation of bilateral lower extremity swelling. Patient is wheelchair-bound but notes that he has had worsening lower extremity swelling over the last several days despite taking his diuretics.  No shortness of breath or PND.  Patient was not compliant with his dietary restrictions.   On presentation vital stable, labs with BNP of 514, hemoglobin 10, bicarb 33 CXR with trace bilateral pleural effusions.EKG with A-fib. Bilateral lower extremity venous Doppler negative for DVT   Patient was admitted for IV diuresis.   Assessment and Plan:  # Acute on chronic diastolic CHF (congestive heart failure) Patient presents for evaluation of bilateral lower extremity swelling despite taking his diuretics as prescribed. Imaging shows trace bilateral pleural effusion, BNP elevated at 514. S/p IV Lasix , d/c;d on 6/20 due to elevated creatinine -Daily weight and BMP -Strict intake and output Cardiology consulted, started torsemide  from 6/21 Continued Aldactone  25 mg p.o. daily 6/22 sCr 1.08--1.42 got worse again today    # Paroxysmal atrial fibrillation  Continue to monitor on telemetry Cardiology restarted amiodarone -apparently he has stopped taking his home meds. Continue with Eliquis  and metoprolol    # Cellulitis Patient has bilateral lower extremity weeping edema with areas of redness bilaterally over the anterior  tibia and differential warmth Lower extremity venous Doppler negative for DVT Treat empirically with Rocephin  Elevate lower extremity   Essential hypertension Blood pressure is stable Monitor closely   Myasthenia gravis Continue prednisone  and azathioprine  Supportive care   Hypothyroidism Continue Synthroid    Parkinson disease - Continue home Sinemet    Macrocytic anemia Hemoglobin of 9.2 with MCV of 110. Anemia panel with normal iron  and low normal folate, B12 levels pending. - Continue to monitor and supplement as needed  Vitamin B12 level 376, goal >400, started B12 oral supplement.   Body mass index is 31.06 kg/m.  Interventions:  Diet: 2 g sodium diet DVT Prophylaxis: Therapeutic Anticoagulation with Eliquis    Advance goals of care discussion: DNR-intervene  Family Communication: family was present at bedside, at the time of interview.  The pt provided permission to discuss medical plan with the family. Opportunity was given to ask question and all questions were answered satisfactorily.   Disposition:  Pt is from Home, admitted with CHF s/p IV lasix , clinically stable, medically optimized and cleared by cardiology. Patient is medically stable to discharge. Discharge to SNF, when bed will be available. Follow TOC for disposition plan  Subjective: No significant events overnight, patient was laying comfortably, denied any complaints.  Physical Exam: General: NAD, lying comfortably Appear in no distress, affect appropriate Eyes: PERRLA ENT: Oral Mucosa Clear, moist  Neck: no JVD,  Cardiovascular: S1 and S2 Present, no Murmur,  Respiratory: good respiratory effort, Bilateral Air entry equal and Decreased, no Crackles, no wheezes Abdomen: Bowel Sound present, Soft and no tenderness,  Skin: no rashes Extremities: 1-2+ Pedal edema, no calf tenderness Neurologic: without any new focal findings Gait not checked due  to patient safety concerns  Vitals:    03/03/24 0317 03/03/24 0500 03/03/24 0715 03/03/24 1215  BP: 96/65  115/61 100/75  Pulse: 73  73 80  Resp: 20  19   Temp: 98.3 F (36.8 C)  98.1 F (36.7 C) 98 F (36.7 C)  TempSrc:      SpO2: 95%  91% 99%  Weight:  98.2 kg    Height:        Intake/Output Summary (Last 24 hours) at 03/03/2024 1418 Last data filed at 03/03/2024 1200 Gross per 24 hour  Intake 123 ml  Output 1900 ml  Net -1777 ml   Filed Weights   03/01/24 0500 03/02/24 0459 03/03/24 0500  Weight: 103.4 kg 100.7 kg 98.2 kg    Data Reviewed: I have personally reviewed and interpreted daily labs, tele strips, imagings as discussed above. I reviewed all nursing notes, pharmacy notes, vitals, pertinent old records I have discussed plan of care as described above with RN and patient/family.  CBC: Recent Labs  Lab 02/26/24 1750 02/28/24 0315 03/02/24 0518 03/03/24 0111  WBC 4.0 3.7* 5.8 6.0  NEUTROABS 3.5  --   --   --   HGB 10.0* 9.2* 10.7* 11.0*  HCT 32.7* 30.0* 34.6* 35.2*  MCV 115.5* 110.7* 112.7* 110.7*  PLT 231 244 266 293   Basic Metabolic Panel: Recent Labs  Lab 02/28/24 0315 02/29/24 0239 03/01/24 0251 03/01/24 0820 03/02/24 0518 03/03/24 0111  NA 143 143 143  --  141 142  K 3.9 3.7 3.4*  --  4.4 4.4  CL 102 100 99  --  104 100  CO2 32 34* 34*  --  28 31  GLUCOSE 89 100* 103*  --  100* 106*  BUN 20 22 24*  --  19 21  CREATININE 1.04 1.34* 1.37*  --  1.08 1.42*  CALCIUM  8.5* 8.7* 8.2*  --  8.6* 8.8*  MG  --   --   --  1.7 2.0 2.0  PHOS  --   --   --  3.3 2.6 3.4    Studies: No results found.  Scheduled Meds:  amiodarone   400 mg Oral BID   Followed by   NOREEN ON 03/09/2024] amiodarone   200 mg Oral Daily   apixaban   5 mg Oral BID   azaTHIOprine   150 mg Oral Daily   carbidopa -levodopa   1 tablet Oral TID   cholecalciferol   2,000 Units Oral Daily   cyanocobalamin   1,000 mcg Oral Daily   dapagliflozin  propanediol  10 mg Oral Daily   levothyroxine   50 mcg Oral Q0600   melatonin  5  mg Oral QHS   metoprolol  tartrate  12.5 mg Oral BID   polyethylene glycol  17 g Oral Daily   predniSONE   10 mg Oral Q M,W,F   sodium chloride  flush  3 mL Intravenous Q12H   spironolactone   25 mg Oral Daily   torsemide   20 mg Oral BID   Continuous Infusions:  cefTRIAXone  (ROCEPHIN )  IV 2 g (03/03/24 1054)   PRN Meds: acetaminophen  **OR** acetaminophen , meclizine, morphine  injection, ondansetron  **OR** ondansetron  (ZOFRAN ) IV, sodium chloride  flush, traZODone   Time spent: 35 minutes  Author: ELVAN SOR. MD [facility] 03/03/2024 2:18 PM  To reach On-call, see care teams to locate the attending and reach out to them via [email or web address]. If 7PM-7AM, please contact night-coverage If you still have difficulty reaching the attending provider, please page the [facility] (Director on Call) for [facility] on amion for assistance.

## 2024-03-04 ENCOUNTER — Inpatient Hospital Stay

## 2024-03-04 DIAGNOSIS — I5033 Acute on chronic diastolic (congestive) heart failure: Secondary | ICD-10-CM | POA: Diagnosis not present

## 2024-03-04 LAB — BASIC METABOLIC PANEL WITH GFR
Anion gap: 13 (ref 5–15)
BUN: 26 mg/dL — ABNORMAL HIGH (ref 8–23)
CO2: 30 mmol/L (ref 22–32)
Calcium: 8.8 mg/dL — ABNORMAL LOW (ref 8.9–10.3)
Chloride: 97 mmol/L — ABNORMAL LOW (ref 98–111)
Creatinine, Ser: 1.67 mg/dL — ABNORMAL HIGH (ref 0.61–1.24)
GFR, Estimated: 41 mL/min — ABNORMAL LOW (ref >60)
Glucose, Bld: 106 mg/dL — ABNORMAL HIGH (ref 70–99)
Potassium: 4 mmol/L (ref 3.5–5.1)
Sodium: 140 mmol/L (ref 135–145)

## 2024-03-04 LAB — CBC
HCT: 35.8 % — ABNORMAL LOW (ref 39.0–52.0)
Hemoglobin: 11.4 g/dL — ABNORMAL LOW (ref 13.0–17.0)
MCH: 34.9 pg — ABNORMAL HIGH (ref 26.0–34.0)
MCHC: 31.8 g/dL (ref 30.0–36.0)
MCV: 109.5 fL — ABNORMAL HIGH (ref 80.0–100.0)
Platelets: 286 10*3/uL (ref 150–400)
RBC: 3.27 MIL/uL — ABNORMAL LOW (ref 4.22–5.81)
RDW: 15.8 % — ABNORMAL HIGH (ref 11.5–15.5)
WBC: 6.3 10*3/uL (ref 4.0–10.5)
nRBC: 0 % (ref 0.0–0.2)

## 2024-03-04 LAB — BRAIN NATRIURETIC PEPTIDE: B Natriuretic Peptide: 256.7 pg/mL — ABNORMAL HIGH (ref 0.0–100.0)

## 2024-03-04 LAB — PHOSPHORUS: Phosphorus: 4.1 mg/dL (ref 2.5–4.6)

## 2024-03-04 LAB — MAGNESIUM: Magnesium: 2 mg/dL (ref 1.7–2.4)

## 2024-03-04 MED ORDER — SPIRONOLACTONE 25 MG PO TABS
25.0000 mg | ORAL_TABLET | Freq: Every day | ORAL | Status: DC
  Administered 2024-03-05 – 2024-03-06 (×2): 25 mg via ORAL
  Filled 2024-03-04 (×2): qty 1

## 2024-03-04 NOTE — Progress Notes (Signed)
 PT Cancellation Note  Patient Details Name: Francisco [name] MRN: [id number] DOB: 12/22/1945   Cancelled Treatment:     Pt off floor for Abd Ultrasound. Will re-attempt next available date/time per POC.   Darice JAYSON Bohr 03/04/2024, 2:58 PM

## 2024-03-04 NOTE — Plan of Care (Signed)
   Problem: Education: Goal: Ability to demonstrate management of disease process will improve Outcome: Progressing Goal: Ability to verbalize understanding of medication therapies will improve Outcome: Progressing Goal: Individualized Educational Video(s) Outcome: Progressing   Problem: Activity: Goal: Capacity to carry out activities will improve Outcome: Progressing   Problem: Cardiac: Goal: Ability to achieve and maintain adequate cardiopulmonary perfusion will improve Outcome: Progressing   Problem: Education: Goal: Knowledge of General Education information will improve Description: Including pain rating scale, medication(s)/side effects and non-pharmacologic comfort measures Outcome: Progressing   Problem: Health Behavior/Discharge Planning: Goal: Ability to manage health-related needs will improve Outcome: Progressing   Problem: Clinical Measurements: Goal: Ability to maintain clinical measurements within normal limits will improve Outcome: Progressing Goal: Will remain free from infection Outcome: Progressing Goal: Diagnostic test results will improve Outcome: Progressing Goal: Respiratory complications will improve Outcome: Progressing Goal: Cardiovascular complication will be avoided Outcome: Progressing   Problem: Activity: Goal: Risk for activity intolerance will decrease Outcome: Progressing   Problem: Nutrition: Goal: Adequate nutrition will be maintained Outcome: Progressing   Problem: Coping: Goal: Level of anxiety will decrease Outcome: Progressing   Problem: Elimination: Goal: Will not experience complications related to bowel motility Outcome: Progressing Goal: Will not experience complications related to urinary retention Outcome: Progressing   Problem: Pain Managment: Goal: General experience of comfort will improve and/or be controlled Outcome: Progressing   Problem: Safety: Goal: Ability to remain free from injury will  improve Outcome: Progressing   Problem: Skin Integrity: Goal: Risk for impaired skin integrity will decrease Outcome: Progressing

## 2024-03-04 NOTE — TOC Progression Note (Signed)
 Transition of Care [facility]) - Progression Note    Patient Details  Name: Francisco [name] MRN: [id number] Date of Birth: 06-20-1946  Transition of Care [facility]) CM/SW Contact  Marinda Cooks, RN Phone Number: 03/04/2024, 5:31 PM  Clinical Narrative:    This CM spoke with pt introduced role and dicussed dc plan and bed selection for SNF recommendation. Pt confirmed his 1st choice is [facility]when medially cleared. TOC will cont to follow dc planning / care coordination and update as applicable .    Expected Discharge Plan and Services   Discharge Planning Services: CM Consult Post Acute Care Choice: Home Health Living arrangements for the past 2 months: Single Family Home                                       Social Determinants of Health (SDOH) Interventions SDOH Screenings   Food Insecurity: No Food Insecurity (02/27/2024)  Housing: Low Risk  (02/27/2024)  Transportation Needs: No Transportation Needs (02/27/2024)  Utilities: Not At Risk (02/27/2024)  Depression (PHQ2-9): Low Risk  (08/30/2021)  Financial Resource Strain: Low Risk  (02/14/2024)   Received from [facility]  Social Connections: Moderately Integrated (02/27/2024)  Tobacco Use: Medium Risk (02/26/2024)    Readmission Risk Interventions     No data to display

## 2024-03-04 NOTE — Progress Notes (Signed)
 [facility] Progress Note  Patient: Francisco Francisco [name]    FMW:[id number]  DOA: 02/26/2024     Date of Service: the patient was seen and examined Francisco [name] 03/04/2024  Chief Complaint  Patient presents with   Leg Swelling   Brief hospital course: Francisco Francisco [name] is a 78 y.o. male with medical history significant for paroxysmal A-fib Francisco [name] chronic anticoagulation therapy, stage III chronic kidney disease, chronic diastolic dysfunction CHF with last known LVEF of 55 to 60% from an echo TEE which was done 06/25, history of myasthenia gravis who presents to the emergency room for evaluation of bilateral lower extremity swelling. Patient is wheelchair-bound but notes that he has had worsening lower extremity swelling over the last several days despite taking his diuretics.  No shortness of breath or PND.  Patient was not compliant with his dietary restrictions.   Francisco [name] presentation vital stable, labs with BNP of 514, hemoglobin 10, bicarb 33 CXR with trace bilateral pleural effusions.EKG with A-fib. Bilateral lower extremity venous Doppler negative for DVT   Patient was admitted for IV diuresis.   Assessment and Plan:  # Acute Francisco [name] chronic diastolic CHF (congestive heart failure) Patient presents for evaluation of bilateral lower extremity swelling despite taking his diuretics as prescribed. Imaging shows trace bilateral pleural effusion, BNP elevated at 514. S/p IV Lasix , d/c;d Francisco [name] 6/20 due to elevated creatinine -Daily weight and BMP -Strict intake and output Cardiology consulted, started torsemide  from 6/21 Continued Aldactone  25 mg p.o. daily 6/23 sCr 1.08--1.42--1.67 got worse again today Held torsemide  today as per cardiology due to worsening of renal function.    # AKI due to diuresis Held torsemide  for now Monitor renal function and urine output sCr 1.67 today Bladder scan ruled out urinary retention Follow renal sonogram RN was advised to do bladder scan every shift    #  Paroxysmal atrial fibrillation  Continue to monitor Francisco [name] telemetry Cardiology restarted amiodarone -apparently he has stopped taking his home meds. Continue with Eliquis  and metoprolol    # Cellulitis Patient has bilateral lower extremity weeping edema with areas of redness bilaterally over the anterior tibia and differential warmth Lower extremity venous Doppler negative for DVT S/p empirically Abx ceftriaxone  x 5 days given  Elevate lower extremity   Essential hypertension Blood pressure is stable Monitor closely   Myasthenia gravis Continue prednisone  and azathioprine  Supportive care   Hypothyroidism Continue Synthroid    Parkinson disease - Continue home Sinemet    Macrocytic anemia Hemoglobin of 9.2 with MCV of 110. Anemia panel with normal iron  and low normal folate, B12 levels pending. - Continue to monitor and supplement as needed  Vitamin B12 level 376, goal >400, started B12 oral supplement.   Body mass index is 30.87 kg/m.  Interventions:  Diet: 2 g sodium diet DVT Prophylaxis: Therapeutic Anticoagulation with Eliquis    Advance goals of care discussion: DNR-intervene  Family Communication: family was present at bedside, at the time of interview.  The pt provided permission to discuss medical plan with the family. Opportunity was given to ask question and all questions were answered satisfactorily.   Disposition:  Pt is from Home, admitted with CHF s/p IV lasix , overall patient's improving.  Patient was cleared by cardiology. 6/23 developed AKI due to diuresis.  Need to be monitored for renal functions Discharge to SNF, awaiting for placement, in the meantime we will monitor renal functions. Follow TOC for disposition plan  Subjective: No significant events overnight, patient was resting comfortably in the recliner, lower extremity edema is getting  better, patient denies any difficulty voiding.   Physical Exam: General: NAD, lying comfortably Appear in no  distress, affect appropriate Eyes: PERRLA ENT: Oral Mucosa Clear, moist  Neck: no JVD,  Cardiovascular: S1 and S2 Present, no Murmur,  Respiratory: good respiratory effort, Bilateral Air entry equal and Decreased, no Crackles, no wheezes Abdomen: Bowel Sound present, Soft and no tenderness,  Skin: no rashes Extremities: 1-2+ Pedal edema, compression bandages intact Neurologic: without any new focal findings Gait not checked due to patient safety concerns  Vitals:   03/04/24 0335 03/04/24 0500 03/04/24 0733 03/04/24 1144  BP:  (!) 103/58 106/69 91/65  Pulse:  88 76 95  Resp:  18    Temp:  97.6 F (36.4 C) 98.6 F (37 C) 97.6 F (36.4 C)  TempSrc:  Oral    SpO2:   93% 99%  Weight: 97.6 kg     Height:        Intake/Output Summary (Last 24 hours) at 03/04/2024 1539 Last data filed at 03/04/2024 1300 Gross per 24 hour  Intake 360 ml  Output 1150 ml  Net -790 ml   Filed Weights   03/02/24 0459 03/03/24 0500 03/04/24 0335  Weight: 100.7 kg 98.2 kg 97.6 kg    Data Reviewed: I have personally reviewed and interpreted daily labs, tele strips, imagings as discussed above. I reviewed all nursing notes, pharmacy notes, vitals, pertinent old records I have discussed plan of care as described above with RN and patient/family.  CBC: Recent Labs  Lab 02/26/24 1750 02/28/24 0315 03/02/24 0518 03/03/24 0111 03/04/24 0211  WBC 4.0 3.7* 5.8 6.0 6.3  NEUTROABS 3.5  --   --   --   --   HGB 10.0* 9.2* 10.7* 11.0* 11.4*  HCT 32.7* 30.0* 34.6* 35.2* 35.8*  MCV 115.5* 110.7* 112.7* 110.7* 109.5*  PLT 231 244 266 293 286   Basic Metabolic Panel: Recent Labs  Lab 02/29/24 0239 03/01/24 0251 03/01/24 0820 03/02/24 0518 03/03/24 0111 03/04/24 0211  NA 143 143  --  141 142 140  K 3.7 3.4*  --  4.4 4.4 4.0  CL 100 99  --  104 100 97*  CO2 34* 34*  --  28 31 30   GLUCOSE 100* 103*  --  100* 106* 106*  BUN 22 24*  --  19 21 26*  CREATININE 1.34* 1.37*  --  1.08 1.42* 1.67*   CALCIUM  8.7* 8.2*  --  8.6* 8.8* 8.8*  MG  --   --  1.7 2.0 2.0 2.0  PHOS  --   --  3.3 2.6 3.4 4.1    Studies: No results found.  Scheduled Meds:  amiodarone   400 mg Oral BID   Followed by   Francisco Francisco [name] 03/09/2024] amiodarone   200 mg Oral Daily   apixaban   5 mg Oral BID   azaTHIOprine   150 mg Oral Daily   carbidopa -levodopa   1 tablet Oral TID   cholecalciferol   2,000 Units Oral Daily   cyanocobalamin   1,000 mcg Oral Daily   dapagliflozin  propanediol  10 mg Oral Daily   levothyroxine   50 mcg Oral Q0600   melatonin  5 mg Oral QHS   metoprolol  tartrate  12.5 mg Oral BID   polyethylene glycol  17 g Oral Daily   predniSONE   10 mg Oral Q M,W,F   sodium chloride  flush  3 mL Intravenous Q12H   [START Francisco [name] 03/05/2024] spironolactone   25 mg Oral Daily   Continuous Infusions:   PRN Meds:  acetaminophen  **OR** acetaminophen , meclizine, morphine  injection, ondansetron  **OR** ondansetron  (ZOFRAN ) IV, sodium chloride  flush, traZODone   Time spent: 35 minutes  Author: ELVAN SOR. MD [facility] 03/04/2024 3:39 PM  To reach Francisco [name]-call, see care teams to locate the attending and reach out to them via [email or web address]. If 7PM-7AM, please contact night-coverage If you still have difficulty reaching the attending provider, please page the [facility] (Director Francisco [name] Call) for [facility] Francisco [name] amion for assistance.

## 2024-03-04 NOTE — Progress Notes (Signed)
 [facility]IN[facility] CARDIOLOGY PROGRESS NOTE       Patient ID: Francisco [name] MRN: [id number] DOB/AGE: [birth year] 78 y.o.  Admit date: 02/26/2024 Referring Physician Dr. Trudy Primary Physician Fernande Ophelia JINNY DOUGLAS, MD Primary Cardiologist Dr. Wilburn Reason for Consultation [name]  HPI: Francisco [name] is a 78 y.o. male  with a past medical history of persistent atrial fibrillation (recent successful TEE/DCCV on 06/12, HX ablation in 2023), hx paroxsymal SVT, chronic HFpEF (predominately right sided HF), Severe TR, moderate pulmonary hypertension, hypertension, hyperlipidemia, myasthenia gravis, COPD  who presented to the ED on 02/26/2024 for worsening lower extremity edema and weeping. Cardiology was consulted for further evaluation.   Interval History: -Patient seen and examined this AM and laying comfortably in hospital bed. Patient states he feels good this AM. Patient appears near euvolemia. Denies chest pain, SOB or orthopnea. LEE much improved. -Patients BP and HR stable this AM. Per tele in AF with rates 70-80s -Worsening Cr function this AM 1.08 > 1.42 > 1.67 -Patient remains on room air with stable SpO2.   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   Carotid stenosis    CHF (congestive heart failure) (HCC)    CKD (chronic kidney disease)    CKD (chronic kidney disease) stage 3, GFR 30-59 ml/min (HCC)    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   DDD (degenerative disc disease), cervical    Dysrhythmia    post carotid stent bradycardia; PAF 09/2020   GERD (gastroesophageal reflux disease)     Hypercholesterolemia    Hypertension    Hypothyroidism    pt takes Levothyroxine  daily   Lumbosacral spinal stenosis    Myasthenia gravis, adult form (HCC)    PAD (peripheral artery disease) (HCC)    Parkinson disease (HCC)    Shortness of breath    Lung MD- Dr Burnett Servant   Sleep apnea    do not use CPAP every night   Stroke (HCC) 05/31/2023    Past Surgical History:  Procedure Laterality Date   ANTERIOR CERVICAL DECOMP/DISCECTOMY FUSION  07/18/2011   Procedure: ANTERIOR CERVICAL DECOMPRESSION/DISCECTOMY FUSION 2 LEVELS;  Surgeon: Victory LABOR Pool;  Location: [facility];  Service: Neurosurgery;  Laterality: N/A;  cervical five-six, cervical six-seven anterior cervical discectomy and fusion   ATRIAL FIBRILLATION ABLATION     BACK SURGERY     in 1985 [facility]   BILATERAL CARPAL TUNNEL RELEASE     01/2020 Right, 04/2020 Left   CARDIAC CATHETERIZATION     2005 at [facility], no stents   CARDIOVERSION N/A 02/22/2024   Procedure: CARDIOVERSION;  Surgeon: Alluri, Keller BROCKS, MD;  Location: [facility] ORS;  Service: Cardiovascular;  Laterality: N/A;   CAROTID PTA/STENT INTERVENTION N/A 09/17/2020   Procedure: CAROTID PTA/STENT INTERVENTION;  Surgeon: Marea Selinda RAMAN, MD;  Location: [facility];  Service: Cardiovascular;  Laterality: N/A;   CATARACT EXTRACTION W/PHACO Left 01/06/2020   Procedure: CATARACT EXTRACTION PHACO AND INTRAOCULAR LENS PLACEMENT (IOC) ISTENT INJ LEFT 3.81  00:33.3;  Surgeon: Myrna Adine Anes,  MD;  Location: [facility];  Service: Ophthalmology;  Laterality: Left;   CATARACT EXTRACTION W/PHACO Right 02/03/2020   Procedure: CATARACT EXTRACTION PHACO AND INTRAOCULAR LENS PLACEMENT (IOC) RIGHT ISTENT INJ;  Surgeon: Myrna Adine Anes, MD;  Location: [facility];  Service: Ophthalmology;  Laterality: Right;  4.29 0:35.6   COLONOSCOPY     HERNIA REPAIR Left    inguinal hernia repair in 1985   LUMBAR LAMINECTOMY/DECOMPRESSION MICRODISCECTOMY Left 02/24/2014    Procedure: LUMBAR LAMINECTOMY/DECOMPRESSION MICRODISCECTOMY LUMBAR THREE-FOUR, FOUR-FIVE, LEFT FIVE-SACRAL ONE ;  Surgeon: Victory DELENA Gunnels, MD;  Location: [facility];  Service: Neurosurgery;  Laterality: Left;  LUMBAR LAMINECTOMY/DECOMPRESSION MICRODISCECTOMY LUMBAR THREE-FOUR, FOUR-FIVE, LEFT FIVE-SACRAL ONE    LUMBAR LAMINECTOMY/DECOMPRESSION MICRODISCECTOMY N/A 05/03/2021   Procedure: Laminectomy and Foraminotomy - L2-L3;  Surgeon: Gunnels Victory, MD;  Location: [facility];  Service: Neurosurgery;  Laterality: N/A;  3C   POSTERIOR CERVICAL FUSION/FORAMINOTOMY N/A 08/07/2020   Procedure: C3-6 POSTERIOR FUSION WITH DECOMPRESSION;  Surgeon: Clois Fret, MD;  Location: [facility];  Service: Neurosurgery;  Laterality: N/A;   PROSTATECTOMY  04/2013   [facility] Dr ozell Geralds    TEE WITHOUT CARDIOVERSION N/A 02/22/2024   Procedure: ECHOCARDIOGRAM, TRANSESOPHAGEAL;  Surgeon: Wilburn Keller BROCKS, MD;  Location: [facility];  Service: Cardiovascular;  Laterality: N/A;    Medications Prior to Admission  Medication Sig Dispense Refill Last Dose/Taking   apixaban  (ELIQUIS ) 5 MG TABS tablet Take 5 mg by mouth 2 (two) times daily.   02/26/2024 at  8:00 PM   azaTHIOprine  (IMURAN ) 50 MG tablet Take 150 mg by mouth daily.    02/26/2024 at  9:00 AM   carbidopa -levodopa  (SINEMET  IR) 25-100 MG tablet Take 1 tablet by mouth 3 (three) times daily.   02/26/2024 at  8:00 PM   Cholecalciferol  (D3-1000) 25 MCG (1000 UT) capsule Take 2,000 Units by mouth daily.   02/26/2024 Morning   cyanocobalamin  (VITAMIN B12) 1000 MCG tablet Take 1,000 mcg by mouth daily.   02/26/2024 Morning   HYDROcodone -acetaminophen  (NORCO) 10-325 MG tablet Take 1-2 tablets by mouth every 6 (six) hours as needed for moderate pain (pain score 4-6) or severe pain (pain score 7-10).   Unknown   levothyroxine  (SYNTHROID ) 50 MCG tablet Take 1 tablet (50 mcg total) by mouth daily at 6 (six) AM. 30 tablet 0 02/26/2024 Morning   meclizine (ANTIVERT) 25 MG tablet Take 1  tablet by mouth 3 (three) times daily as needed for dizziness.   Unknown   metoprolol  tartrate (LOPRESSOR ) 25 MG tablet Take 25 mg by mouth daily.   02/26/2024 Morning   predniSONE  (DELTASONE ) 10 MG tablet Take 10 mg by mouth every Monday, Wednesday, and Friday.   02/26/2024 Morning   pregabalin  (LYRICA ) 50 MG capsule Take 50 mg by mouth 2 (two) times daily.   02/26/2024 Evening   amiodarone  (PACERONE ) 200 MG tablet Take 200 mg by mouth daily. (Patient not taking: Reported on 02/27/2024)   Not Taking   traZODone  (DESYREL ) 50 MG tablet Take 50 mg by mouth at bedtime as needed for sleep. (Patient not taking: Reported on 02/27/2024)   Not Taking   Social History   Socioeconomic History   Marital status: Widowed    Spouse name: [name]   Number of children: Not on file   Years of education: Not on file   Highest education level: Not on file  Occupational History   Not on file  Tobacco Use   Smoking status: Former    Current packs/day: 0.00  Average packs/day: 1 pack/day for 20.0 years (20.0 ttl pk-yrs)    Types: Cigarettes    Start date: 09/12/1981    Quit date: 09/12/2001    Years since quitting: 22.4   Smokeless tobacco: Never  Vaping Use   Vaping status: Never Used  Substance and Sexual Activity   Alcohol use: Yes    Alcohol/week: 3.0 standard drinks of alcohol    Types: 3 Glasses of wine per week    Comment: 3 glasses a wine a week   Drug use: No   Sexual activity: Yes  Other Topics Concern   Not on file  Social History Narrative   Not on file   Social Drivers of Health   Financial Resource Strain: Low Risk  (02/14/2024)   Received from [facility]   Overall Financial Resource Strain (CARDIA)    Difficulty of Paying Living Expenses: Not hard at all  Food Insecurity: No Food Insecurity (02/27/2024)   Hunger Vital Sign    Worried About Running Out of Food in the Last Year: Never true    Ran Out of Food in the Last Year: Never true  Transportation Needs: No  Transportation Needs (02/27/2024)   PRAPARE - Administrator, Civil Service (Medical): No    Lack of Transportation (Non-Medical): No  Physical Activity: Not on file  Stress: Not on file  Social Connections: Moderately Integrated (02/27/2024)   Social Connection and Isolation Panel    Frequency of Communication with Friends and Family: More than three times a week    Frequency of Social Gatherings with Friends and Family: Three times a week    Attends Religious Services: More than 4 times per year    Active Member of Clubs or Organizations: Yes    Attends Banker Meetings: More than 4 times per year    Marital Status: Widowed  Intimate Partner Violence: Unknown (02/27/2024)   Humiliation, Afraid, Rape, and Kick questionnaire    Fear of Current or Ex-Partner: Not on file    Emotionally Abused: Not on file    Physically Abused: No    Sexually Abused: Not on file    Family History  Problem Relation Age of Onset   Hypertension Mother    Stroke Mother    Stroke Father      Vitals:   03/03/24 2334 03/04/24 0335 03/04/24 0500 03/04/24 0733  BP: 107/68  (!) 103/58 106/69  Pulse: 79  88 76  Resp: 20  18   Temp: (!) 97.4 F (36.3 C)  97.6 F (36.4 C) 98.6 F (37 C)  TempSrc: Oral  Oral   SpO2: 95%   93%  Weight:  97.6 kg    Height:        PHYSICAL EXAM General: Well-appearing elderly male, well nourished, in no acute distress. HEENT: Normocephalic and atraumatic. Neck: No JVD.   Lungs: Normal respiratory effort on room air.  CTAB Heart: Irregular, irregular, controlled rates. S1, S2, + Murmur Abdomen: Non-distended appearing.  Msk: Normal strength and tone for age. Extremities: Warm and well perfused. No clubbing, cyanosis, edema Neuro: Alert and oriented X 3. Psych: Answers questions appropriately.   Labs: Basic Metabolic Panel: Recent Labs    03/03/24 0111 03/04/24 0211  NA 142 140  K 4.4 4.0  CL 100 97*  CO2 31 30  GLUCOSE 106* 106*  BUN  21 26*  CREATININE 1.42* 1.67*  CALCIUM  8.8* 8.8*  MG 2.0 2.0  PHOS 3.4 4.1  Liver Function Tests: No results for input(s): AST, ALT, ALKPHOS, BILITOT, PROT, ALBUMIN  in the last 72 hours. No results for input(s): LIPASE, AMYLASE in the last 72 hours. CBC: Recent Labs    03/03/24 0111 03/04/24 0211  WBC 6.0 6.3  HGB 11.0* 11.4*  HCT 35.2* 35.8*  MCV 110.7* 109.5*  PLT 293 286   Cardiac Enzymes: No results for input(s): CKTOTAL, CKMB, CKMBINDEX, TROPONINIHS in the last 72 hours. BNP: Recent Labs    03/04/24 0211  BNP 256.7*   D-Dimer: No results for input(s): DDIMER in the last 72 hours. Hemoglobin A1C: No results for input(s): HGBA1C in the last 72 hours. Fasting Lipid Panel: No results for input(s): CHOL, HDL, LDLCALC, TRIG, CHOLHDL, LDLDIRECT in the last 72 hours. Thyroid  Function Tests: No results for input(s): TSH, T4TOTAL, T3FREE, THYROIDAB in the last 72 hours.  Invalid input(s): FREET3 Anemia Panel: No results for input(s): VITAMINB12, FOLATE, FERRITIN, TIBC, IRON , RETICCTPCT in the last 72 hours.    Radiology: US  Venous Img Lower Bilateral Result Date: 02/27/2024 EXAM: ULTRASOUND DUPLEX OF THE BILATERAL LOWER EXTREMITY VEINS TECHNIQUE: Duplex ultrasound using B-mode/gray scaled imaging and Doppler spectral analysis and color flow was obtained of the deep venous structures of the bilateral lower extremity. COMPARISON: None. CLINICAL HISTORY: Bilateral leg swelling, worse on right side. FINDINGS: Bilateral calf veins are suboptimally visualized. The visualized veins of the lower extremity are patent and free of echogenic thrombus. The veins demonstrate good compressibility with normal color flow study and spectral analysis. IMPRESSION: 1. No evidence of DVT. Electronically signed by: Pinkie Pebbles MD 02/27/2024 02:49 AM EDT RP Workstation: [id number]   DG Chest Portable 1 View Result Date:  02/27/2024 CLINICAL DATA:  eval for edema EXAM: PORTABLE CHEST 1 VIEW COMPARISON:  Chest x-ray 05/31/2023, CT chest 12/06/2022, chest x-ray 12/06/2022 FINDINGS: The heart and mediastinal contours are unchanged. Atherosclerotic plaque. Right base linear atelectasis versus scarring. No focal consolidation. No pulmonary edema. Question trace bilateral pleural effusions. No pneumothorax. No acute osseous abnormality. Cervical spine surgical hardware partially visualized. IMPRESSION: 1. Question trace bilateral pleural effusions. Recommend further evaluation with chest x-ray PA and lateral view of the chest. 2.  Aortic Atherosclerosis (ICD10-I70.0). Electronically Signed   By: Morgane  Naveau M.D.   On: 02/27/2024 01:56   ECHO TEE Result Date: 02/23/2024    TRANSESOPHOGEAL ECHO REPORT   Patient Name:   ELSWORTH LEDIN Date of Exam: 02/22/2024 Medical Rec #:  [id number]          Height:       70.0 in Accession #:    [id number]         Weight:       230.0 lb Date of Birth:  Sep 26, 1945          BSA:          2.215 m Patient Age:    70 years           BP:           119/73 mmHg Patient Gender: M                  HR:           81 bpm. Exam Location:  [facility] Procedure: Transesophageal Echo, Cardiac Doppler and Color Doppler (Both            Spectral and Color Flow Doppler were utilized during procedure). Indications:     Persistent Afib I48.19  History:  Patient has prior history of Echocardiogram examinations, most                  recent 06/01/2023. Arrythmias:Atrial Fibrillation; Risk                  Factors:Hypertension.  Sonographer:     Christopher Furnace Referring Phys:  [id number] Kaelin Bonelli Diagnosing Phys: Keller Paterson PROCEDURE: After discussion of the risks and benefits of a TEE, an informed consent was obtained from the patient. The transesophogeal probe was passed without difficulty through the esophogus of the patient. Local oropharyngeal anesthetic was provided with Cetacaine . Sedation performed by  different physician. The patient was monitored while under deep sedation. Image quality was excellent. The patient's vital signs; including heart rate, blood pressure, and oxygen saturation; remained stable throughout the procedure. The patient developed no complications during the procedure.  IMPRESSIONS  1. Left ventricular ejection fraction, by estimation, is 55 to 60%. The left ventricle has normal function.  2. Right ventricular systolic function is normal. The right ventricular size is moderately enlarged.  3. Left atrial size was mildly dilated. No left atrial/left atrial appendage thrombus was detected.  4. Right atrial size was severely dilated.  5. The mitral valve is normal in structure. Mild mitral valve regurgitation.  6. Tricuspid valve regurgitation is moderate to severe.  7. The aortic valve is tricuspid. Aortic valve regurgitation is not visualized. FINDINGS  Left Ventricle: Left ventricular ejection fraction, by estimation, is 55 to 60%. The left ventricle has normal function. The left ventricular internal cavity size was normal in size. Right Ventricle: The right ventricular size is moderately enlarged. No increase in right ventricular wall thickness. Right ventricular systolic function is normal. Left Atrium: Left atrial size was mildly dilated. No left atrial/left atrial appendage thrombus was detected. Right Atrium: Right atrial size was severely dilated. Pericardium: There is no evidence of pericardial effusion. Mitral Valve: The mitral valve is normal in structure. Mild mitral valve regurgitation. Tricuspid Valve: The tricuspid valve is normal in structure. Tricuspid valve regurgitation is moderate to severe. Aortic Valve: The aortic valve is tricuspid. Aortic valve regurgitation is not visualized. Pulmonic Valve: The pulmonic valve was normal in structure. Pulmonic valve regurgitation is trivial. Aorta: The aortic root is normal in size and structure. IAS/Shunts: No atrial level shunt  detected by color flow Doppler. Keller Paterson Electronically signed by Keller Paterson Signature Date/Time: 02/23/2024/1:21:37 PM    Final     ECHO TEE 02/22/2024  1. Left ventricular ejection fraction, by estimation, is 55 to 60%. The left ventricle has normal function.   2. Right ventricular systolic function is normal. The right ventricular size is moderately enlarged.   3. Left atrial size was mildly dilated. No left atrial/left atrial appendage thrombus was detected.   4. Right atrial size was severely dilated.   5. The mitral valve is normal in structure. Mild mitral valve regurgitation.   6. Tricuspid valve regurgitation is moderate to severe.   7. The aortic valve is tricuspid. Aortic valve regurgitation is not visualized.   TELEMETRY reviewed by me 03/04/2024: Atrial fibrillation rate 70-80s  EKG reviewed by me: atrial fibrillation, rate 50s  Data reviewed by me 03/04/2024: last 24h vitals tele labs imaging I/O hospitalist progress notes.  Principal Problem:   Acute on chronic diastolic CHF (congestive heart failure) (HCC) Active Problems:   Myasthenia gravis (HCC)   Macrocytic anemia   Essential hypertension   Paroxysmal atrial fibrillation (HCC)   Hypothyroidism   Parkinson  disease (HCC)   Obesity (BMI 30-39.9)   Cellulitis    ASSESSMENT AND PLAN:  Francisco [name] is a 78 y.o. male  with a past medical history of persistent atrial fibrillation (recent successful TEE/DCCV on 06/12, HX ablation in 2023), hx paroxsymal SVT, chronic HFpEF (predominately right sided HF), Severe TR, moderate pulmonary hypertension, hypertension, hyperlipidemia, myasthenia gravis, COPD  who presented to the ED on 02/26/2024 for worsening lower extremity edema and weeping. Cardiology was consulted for further evaluation.   # Acute on chronic HFpEF # Paroxsymal atrial fibrillation(recent successful TEE/DCCV on 06/12, HX ablation 2023) # Hypertension # Hyperlipidemia # Moderate Pulmonary  Hypertension Patient presents with worsening LEE and weeping. TEE from 06/12 with pEF (55-60%), mild MR, moderate to severe TR. CXR with bilaterally pulmonary congestion and trace bilateral pleural effusions. BNP elevated at 514. Per tele in AF rates 80-90s. -Hold torsemide  20 mg BID for today. Consider resuming to 20 mg daily if renal function stabilizes.  -Monitor and replenish electrolytes for a goal K >4. Will avoid aggressive magnesium  replacement due to myasthenia gravis to avoid exacerbation.  -Continue spironolactone  25 mg daily.  -Continue low dose metoprolol  tartrate 12.5 mg twice daily. Start at low dose due to myasthenia gravis.  -Continue Farxiga  10 mg daily. (Copay $47.0) -ContinuEliquis  5 mg twice daily for stroke risk reduction.  -Continue Amio 400 mg BID for 10 days, then 200 mg daily for rate and rhythm control  -Consider initiating ARB if BP becomes elevated.  -Recommend wound care for lower extremities.  -Recommend patient move and get out of bed and at least sit in bedside recliner.  -Patient pending SNF rehab placement. Will follow patient until discharge.   Patient awaiting SNF placement. Will arrange for follow up in clinic with Dr. Wilburn in 2 weeks. Heart Failure appointment with Caralyn Hudson on 07/01 at [facility].  This patient's plan of care was discussed and created with Dr. Florencio and he is in agreement.  Signed: Dorene Comfort, PA-C  03/04/2024, 10:27 AM [facility] Card[facility]

## 2024-03-04 NOTE — Progress Notes (Signed)
 Heart Failure Stewardship Pharmacy Note  PCP: Fernande Ophelia JINNY DOUGLAS, MD PCP-Cardiologist: None  HPI: Francisco [name] is a 78 y.o. male with paroxysmal A-fib on chronic anticoagulation therapy, stage III chronic kidney disease, chronic diastolic dysfunction CHF with last known LVEF of 55 to 60% from an echo TEE which was done 06/25, history of myasthenia gravis who presented with bilateral lower extremity edema and increased work of breathing with mild exertion. On admission, BNP was 514.3. Chest x-ray noted trace bilateral pleural effusions. Doppler negative for DVT.  Pertinent cardiac history: Echo in 05/2018 with LVEF >55% and grade II diastolic dysfunction. Low risk stress test in 07/2020. Echo at that time with LVEF 55-60% and normal diastolic function. Carotid stenting performed in 09/2020. AF ablation in 06/2022. Echo in 05/2023 with LVEF 60-65%. TEE 02/2024 noted LVEF 55-60% with severely RA dilation, mild MR, moderate to severe TR. Was cardioverted to sinus bradycardia. Patient did not take amiodarone  after DCCV. Not taking metoprolol  PTA either.  Pertinent Lab Values: Creatinine  Date Value Ref Range Status  03/04/2014 0.99 0.60 - 1.30 mg/dL Final   Creatinine, Ser  Date Value Ref Range Status  03/04/2024 1.67 (H) 0.61 - 1.24 mg/dL Final   BUN  Date Value Ref Range Status  03/04/2024 26 (H) 8 - 23 mg/dL Final  93/76/7984 25 (H) 7 - 18 mg/dL Final   Potassium  Date Value Ref Range Status  03/04/2024 4.0 3.5 - 5.1 mmol/L Final  03/04/2014 3.9 3.5 - 5.1 mmol/L Final   Sodium  Date Value Ref Range Status  03/04/2024 140 135 - 145 mmol/L Final  03/04/2014 141 136 - 145 mmol/L Final   B Natriuretic Peptide  Date Value Ref Range Status  03/04/2024 256.7 (H) 0.0 - 100.0 pg/mL Final    Comment:    Performed at [facility], [address] [place], [place] [address]   Magnesium   Date Value Ref Range Status  03/04/2024 2.0 1.7 - 2.4 mg/dL Final    Comment:     Performed at [facility], [address] [place], [place] [address]   Hgb A1c MFr Bld  Date Value Ref Range Status  06/01/2023 5.5 4.8 - 5.6 % Final    Comment:    (NOTE)         Prediabetes: 5.7 - 6.4         Diabetes: >6.4         Glycemic control for adults with diabetes: <7.0    TSH  Date Value Ref Range Status  05/31/2023 0.989 0.350 - 4.500 uIU/mL Final    Comment:    Performed by a 3rd Generation assay with a functional sensitivity of <=0.01 uIU/mL. Performed at [facility], [address] [place], [place] [address]     Vital Signs:  Temp:  [97.4 F (36.3 C)-98.6 F (37 C)] 97.6 F (36.4 C) (06/23 1144) Pulse Rate:  [71-95] 95 (06/23 1144) Cardiac Rhythm: Atrial fibrillation (06/23 0700) Resp:  [18-20] 18 (06/23 0500) BP: (91-112)/(43-69) 91/65 (06/23 1144) SpO2:  [91 %-99 %] 99 % (06/23 1144) Weight:  [97.6 kg (215 lb 2.7 oz)] 97.6 kg (215 lb 2.7 oz) (06/23 0335)  Intake/Output Summary (Last 24 hours) at 03/04/2024 1245 Last data filed at 03/04/2024 0900 Gross per 24 hour  Intake 120 ml  Output 1400 ml  Net -1280 ml   Current Heart Failure Medications:  Loop diuretic: furosemide  40 mg IV BID Beta-Blocker: metoprolol  tartrate 12.5 mg BID ACEI/ARB/ARNI: none  MRA: spironolactone  25 mg daily SGLT2i: Farxiga  10 mg daily Other: none  Prior to admission Heart Failure Medications:  Loop diuretic: torsemide  20 mg daily Beta-Blocker: metoprolol  succinate 25 mg daily (not taking prior to admission because he reports a doctor told him to stop it) ACEI/ARB/ARNI: none MRA: spironolactone  25 mg daily SGLT2i: Farxiga  10 mg daily Other: none  Assessment: 1. Acute on chronic diastolic heart failure (LVEF 55-60%)  , due to NICM. NYHA class III-IV symptoms.  -Symptoms: Reports feeling at baseline today.  -Volume: Appears euvolemic today. Torsemide  20 BID held due to creatinine/BUN bump. Chloride is down. Suspect hypovolemia. Agree with holding  torsemide  and resuming at 20 mg daily when creatinine is improved. -Hemodynamics: BP at lower end of normal and HR is 90s.   -SGLT2i: Farxiga  10 mg daily restarted. -MRA: Continue spironolactone  25 mg daily -ARNI: BP stable, no need for RAASi at this time. -With myasthenia gravis, beta blockade can exacerbate symptoms. This is seen less with non-DHP CCBs and amiodarone . Recommend lowest effective dose possible to maintain HR goal. Can consider changing metoprolol  to diltiazem.  Plan: 1) Medication changes recommended at this time: -None  2) Patient assistance: -Pending  3) Education: - Patient has been educated on current HF medications and potential additions to HF medication regimen - Patient verbalizes understanding that over the next few months, these medication doses may change and more medications may be added to optimize HF regimen - Patient has been educated on basic disease state pathophysiology and goals of therapy  Medication Assistance / Insurance Benefits Check: Does the patient have prescription insurance?    Type of insurance plan:  Does the patient qualify for medication assistance through manufacturers or grants? Pending   Outpatient Pharmacy: Prior to admission outpatient pharmacy: [facility]      Please do not hesitate to reach out with questions or concerns,  Jaun Bash, PharmD, CPP, BCPS Heart Failure Pharmacist  Phone - [phone] 03/04/2024 12:45 PM

## 2024-03-05 ENCOUNTER — Ambulatory Visit: Payer: Medicare Other

## 2024-03-05 DIAGNOSIS — I5033 Acute on chronic diastolic (congestive) heart failure: Secondary | ICD-10-CM | POA: Diagnosis not present

## 2024-03-05 LAB — BASIC METABOLIC PANEL WITH GFR
Anion gap: 8 (ref 5–15)
BUN: 27 mg/dL — ABNORMAL HIGH (ref 8–23)
CO2: 32 mmol/L (ref 22–32)
Calcium: 8.7 mg/dL — ABNORMAL LOW (ref 8.9–10.3)
Chloride: 102 mmol/L (ref 98–111)
Creatinine, Ser: 1.42 mg/dL — ABNORMAL HIGH (ref 0.61–1.24)
GFR, Estimated: 50 mL/min — ABNORMAL LOW (ref >60)
Glucose, Bld: 106 mg/dL — ABNORMAL HIGH (ref 70–99)
Potassium: 4 mmol/L (ref 3.5–5.1)
Sodium: 142 mmol/L (ref 135–145)

## 2024-03-05 LAB — CBC
HCT: 34.6 % — ABNORMAL LOW (ref 39.0–52.0)
Hemoglobin: 10.9 g/dL — ABNORMAL LOW (ref 13.0–17.0)
MCH: 34.6 pg — ABNORMAL HIGH (ref 26.0–34.0)
MCHC: 31.5 g/dL (ref 30.0–36.0)
MCV: 109.8 fL — ABNORMAL HIGH (ref 80.0–100.0)
Platelets: 278 10*3/uL (ref 150–400)
RBC: 3.15 MIL/uL — ABNORMAL LOW (ref 4.22–5.81)
RDW: 15.8 % — ABNORMAL HIGH (ref 11.5–15.5)
WBC: 6 10*3/uL (ref 4.0–10.5)
nRBC: 0 % (ref 0.0–0.2)

## 2024-03-05 MED ORDER — POLYETHYLENE GLYCOL 3350 17 G PO PACK
17.0000 g | PACK | Freq: Two times a day (BID) | ORAL | Status: DC
  Administered 2024-03-05: 17 g via ORAL
  Filled 2024-03-05 (×2): qty 1

## 2024-03-05 MED ORDER — BISACODYL 5 MG PO TBEC
10.0000 mg | DELAYED_RELEASE_TABLET | Freq: Once | ORAL | Status: AC
  Administered 2024-03-05: 10 mg via ORAL
  Filled 2024-03-05: qty 2

## 2024-03-05 MED ORDER — BISACODYL 5 MG PO TBEC
10.0000 mg | DELAYED_RELEASE_TABLET | Freq: Every day | ORAL | Status: DC
  Administered 2024-03-05: 10 mg via ORAL
  Filled 2024-03-05: qty 2

## 2024-03-05 MED ORDER — BISACODYL 10 MG RE SUPP
10.0000 mg | Freq: Once | RECTAL | Status: AC
  Administered 2024-03-05: 10 mg via RECTAL
  Filled 2024-03-05: qty 1

## 2024-03-05 MED ORDER — BISACODYL 10 MG RE SUPP: 10.0000 mg | Freq: Every day | RECTAL | Status: DC | PRN

## 2024-03-05 MED ORDER — SMOG ENEMA
960.0000 mL | Freq: Once | RECTAL | Status: DC
  Filled 2024-03-05: qty 960

## 2024-03-05 NOTE — Progress Notes (Signed)
 [facility] CLINIC CARDIOLOGY PROGRESS NOTE       Patient ID: Francisco [name] MRN: [id number] DOB/AGE: 1946/07/16 78 y.o.  Admit date: 02/26/2024 Referring Physician Dr. Trudy Primary Physician Fernande Ophelia JINNY DOUGLAS, MD Primary Cardiologist Dr. Wilburn Reason for Consultation [name]  HPI: Francisco [name] is a 78 y.o. male  with a past medical history of persistent atrial fibrillation (recent successful TEE/DCCV on 06/12, HX ablation in 2023), hx paroxsymal SVT, chronic HFpEF (predominately right sided HF), Severe TR, moderate pulmonary hypertension, hypertension, hyperlipidemia, myasthenia gravis, COPD  who presented to the ED on 02/26/2024 for worsening lower extremity edema and weeping. Cardiology was consulted for further evaluation.   Interval History: -Patient seen and examined this AM and laying comfortably in hospital bed. Patient states he feels good this AM. Patient appears euvolemic. Denies chest pain, SOB or orthopnea. LEE improved and at baseline. -Patients BP borderline and HR stable this AM. Per tele in AF with rates 70-80s -Cr function improving today 1.42 > 1.67 > 1.42 -Patient remains on room air with stable SpO2.   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   Carotid stenosis    CHF (congestive heart failure) (HCC)    CKD (chronic kidney disease)    CKD (chronic kidney disease) stage 3, GFR 30-59 ml/min (HCC)    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   DDD (degenerative disc disease), cervical    Dysrhythmia    post carotid stent bradycardia; PAF 09/2020   GERD (gastroesophageal reflux disease)     Hypercholesterolemia    Hypertension    Hypothyroidism    pt takes Levothyroxine  daily   Lumbosacral spinal stenosis    Myasthenia gravis, adult form (HCC)    PAD (peripheral artery disease) (HCC)    Parkinson disease (HCC)    Shortness of breath    Lung MD- Dr Burnett Servant   Sleep apnea    do not use CPAP every night   Stroke (HCC) 05/31/2023    Past Surgical History:  Procedure Laterality Date   ANTERIOR CERVICAL DECOMP/DISCECTOMY FUSION  07/18/2011   Procedure: ANTERIOR CERVICAL DECOMPRESSION/DISCECTOMY FUSION 2 LEVELS;  Surgeon: Victory LABOR Pool;  [facility]: [facility];  Service: Neurosurgery;  Laterality: N/A;  cervical five-six, cervical six-seven anterior cervical discectomy and fusion   ATRIAL FIBRILLATION ABLATION     BACK SURGERY     in 1985 [facility]   BILATERAL CARPAL TUNNEL RELEASE     01/2020 Right, 04/2020 Left   CARDIAC CATHETERIZATION     2005 at [facility], no stents   CARDIOVERSION N/A 02/22/2024   Procedure: CARDIOVERSION;  Surgeon: Alluri, Keller BROCKS, MD;  Location: [facility] ORS;  Service: Cardiovascular;  Laterality: N/A;   CAROTID PTA/STENT INTERVENTION N/A 09/17/2020   Procedure: CAROTID PTA/STENT INTERVENTION;  Surgeon: Marea Selinda RAMAN, MD;  Location: [facility] INVASIVE CV LAB;  Service: Cardiovascular;  Laterality: N/A;   CATARACT EXTRACTION W/PHACO Left 01/06/2020   Procedure: CATARACT EXTRACTION PHACO AND INTRAOCULAR LENS PLACEMENT (IOC) ISTENT INJ LEFT 3.81  00:33.3;  Surgeon: Myrna Cain  Oneil, MD;  Location: [facility];  Service: Ophthalmology;  Laterality: Left;   CATARACT EXTRACTION W/PHACO Right 02/03/2020   Procedure: CATARACT EXTRACTION PHACO AND INTRAOCULAR LENS PLACEMENT (IOC) RIGHT ISTENT INJ;  Surgeon: Myrna Adine Oneil, MD;  Location: [facility];  Service: Ophthalmology;  Laterality: Right;  4.29 0:35.6   COLONOSCOPY     HERNIA REPAIR Left    inguinal hernia repair in 1985   LUMBAR LAMINECTOMY/DECOMPRESSION MICRODISCECTOMY Left  02/24/2014   Procedure: LUMBAR LAMINECTOMY/DECOMPRESSION MICRODISCECTOMY LUMBAR THREE-FOUR, FOUR-FIVE, LEFT FIVE-SACRAL ONE ;  Surgeon: Victory DELENA Gunnels, MD;  Location: [facility];  Service: Neurosurgery;  Laterality: Left;  LUMBAR LAMINECTOMY/DECOMPRESSION MICRODISCECTOMY LUMBAR THREE-FOUR, FOUR-FIVE, LEFT FIVE-SACRAL ONE    LUMBAR LAMINECTOMY/DECOMPRESSION MICRODISCECTOMY N/A 05/03/2021   Procedure: Laminectomy and Foraminotomy - L2-L3;  Surgeon: Gunnels Victory, MD;  Location: [facility] OR;  Service: Neurosurgery;  Laterality: N/A;  3C   POSTERIOR CERVICAL FUSION/FORAMINOTOMY N/A 08/07/2020   Procedure: C3-6 POSTERIOR FUSION WITH DECOMPRESSION;  Surgeon: Clois Fret, MD;  Location: [facility] ORS;  Service: Neurosurgery;  Laterality: N/A;   PROSTATECTOMY  04/2013   [facility] Dr ozell Geralds    TEE WITHOUT CARDIOVERSION N/A 02/22/2024   Procedure: ECHOCARDIOGRAM, TRANSESOPHAGEAL;  Surgeon: Wilburn Keller BROCKS, MD;  Location: [facility] ORS;  Service: Cardiovascular;  Laterality: N/A;    Medications Prior to Admission  Medication Sig Dispense Refill Last Dose/Taking   apixaban  (ELIQUIS ) 5 MG TABS tablet Take 5 mg by mouth 2 (two) times daily.   02/26/2024 at  8:00 PM   azaTHIOprine  (IMURAN ) 50 MG tablet Take 150 mg by mouth daily.    02/26/2024 at  9:00 AM   carbidopa -levodopa  (SINEMET  IR) 25-100 MG tablet Take 1 tablet by mouth 3 (three) times daily.   02/26/2024 at  8:00 PM   Cholecalciferol  (D3-1000) 25 MCG (1000 UT) capsule Take 2,000 Units by mouth daily.   02/26/2024 Morning   cyanocobalamin  (VITAMIN B12) 1000 MCG tablet Take 1,000 mcg by mouth daily.   02/26/2024 Morning   HYDROcodone -acetaminophen  (NORCO) 10-325 MG tablet Take 1-2 tablets by mouth every 6 (six) hours as needed for moderate pain (pain score 4-6) or severe pain (pain score 7-10).   Unknown   levothyroxine  (SYNTHROID ) 50 MCG tablet Take 1 tablet (50 mcg total) by mouth daily at 6 (six) AM. 30 tablet 0 02/26/2024 Morning   meclizine (ANTIVERT) 25 MG  tablet Take 1 tablet by mouth 3 (three) times daily as needed for dizziness.   Unknown   metoprolol  tartrate (LOPRESSOR ) 25 MG tablet Take 25 mg by mouth daily.   02/26/2024 Morning   predniSONE  (DELTASONE ) 10 MG tablet Take 10 mg by mouth every Monday, Wednesday, and Friday.   02/26/2024 Morning   pregabalin  (LYRICA ) 50 MG capsule Take 50 mg by mouth 2 (two) times daily.   02/26/2024 Evening   amiodarone  (PACERONE ) 200 MG tablet Take 200 mg by mouth daily. (Patient not taking: Reported on 02/27/2024)   Not Taking   traZODone  (DESYREL ) 50 MG tablet Take 50 mg by mouth at bedtime as needed for sleep. (Patient not taking: Reported on 02/27/2024)   Not Taking   Social History   Socioeconomic History   Marital status: Widowed    Spouse name: [name]   Number of children: Not on file   Years of education: Not on file   Highest education level: Not on file  Occupational History   Not on file  Tobacco Use   Smoking status: Former    Current packs/day: 0.00  Average packs/day: 1 pack/day for 20.0 years (20.0 ttl pk-yrs)    Types: Cigarettes    Start date: 09/12/1981    Quit date: 09/12/2001    Years since quitting: 22.4   Smokeless tobacco: Never  Vaping Use   Vaping status: Never Used  Substance and Sexual Activity   Alcohol use: Yes    Alcohol/week: 3.0 standard drinks of alcohol    Types: 3 Glasses of wine per week    Comment: 3 glasses a wine a week   Drug use: No   Sexual activity: Yes  Other Topics Concern   Not on file  Social History Narrative   Not on file   Social Drivers of Health   Financial Resource Strain: Low Risk  (02/14/2024)   Received from [facility]   Overall Financial Resource Strain (CARDIA)    Difficulty of Paying Living Expenses: Not hard at all  Food Insecurity: No Food Insecurity (02/27/2024)   Hunger Vital Sign    Worried About Running Out of Food in the Last Year: Never true    Ran Out of Food in the Last Year: Never true  Transportation  Needs: No Transportation Needs (02/27/2024)   PRAPARE - Administrator, Civil Service (Medical): No    Lack of Transportation (Non-Medical): No  Physical Activity: Not on file  Stress: Not on file  Social Connections: Moderately Integrated (02/27/2024)   Social Connection and Isolation Panel    Frequency of Communication with Friends and Family: More than three times a week    Frequency of Social Gatherings with Friends and Family: Three times a week    Attends Religious Services: More than 4 times per year    Active Member of Clubs or Organizations: Yes    Attends Banker Meetings: More than 4 times per year    Marital Status: Widowed  Intimate Partner Violence: Unknown (02/27/2024)   Humiliation, Afraid, Rape, and Kick questionnaire    Fear of Current or Ex-Partner: Not on file    Emotionally Abused: Not on file    Physically Abused: No    Sexually Abused: Not on file    Family History  Problem Relation Age of Onset   Hypertension Mother    Stroke Mother    Stroke Father      Vitals:   03/05/24 0008 03/05/24 0402 03/05/24 0500 03/05/24 0800  BP: (!) 91/56 105/62  96/67  Pulse: 75 71  75  Resp: 20 20    Temp: 98 F (36.7 C) 98 F (36.7 C)  98.1 F (36.7 C)  TempSrc:    Oral  SpO2: 92% 96%  93%  Weight:   97.9 kg   Height:        PHYSICAL EXAM General: Well-appearing elderly male, well nourished, in no acute distress. HEENT: Normocephalic and atraumatic. Neck: No JVD.   Lungs: Normal respiratory effort on room air.  CTAB Heart: Irregular, irregular, controlled rates. S1, S2, + Murmur Abdomen: Non-distended appearing.  Msk: Normal strength and tone for age. Extremities: Warm and well perfused. No clubbing, cyanosis, edema Neuro: Alert and oriented X 3. Psych: Answers questions appropriately.   Labs: Basic Metabolic Panel: Recent Labs    03/03/24 0111 03/04/24 0211 03/05/24 0405  NA 142 140 142  K 4.4 4.0 4.0  CL 100 97* 102  CO2 31  30 32  GLUCOSE 106* 106* 106*  BUN 21 26* 27*  CREATININE 1.42* 1.67* 1.42*  CALCIUM  8.8* 8.8* 8.7*  MG 2.0 2.0  --   PHOS 3.4 4.1  --    Liver Function Tests: No results for input(s): AST, ALT, ALKPHOS, BILITOT, PROT, ALBUMIN  in the last 72 hours. No results for input(s): LIPASE, AMYLASE in the last 72 hours. CBC: Recent Labs    03/04/24 0211 03/05/24 0405  WBC 6.3 6.0  HGB 11.4* 10.9*  HCT 35.8* 34.6*  MCV 109.5* 109.8*  PLT 286 278   Cardiac Enzymes: No results for input(s): CKTOTAL, CKMB, CKMBINDEX, TROPONINIHS in the last 72 hours. BNP: Recent Labs    03/04/24 0211  BNP 256.7*   D-Dimer: No results for input(s): DDIMER in the last 72 hours. Hemoglobin A1C: No results for input(s): HGBA1C in the last 72 hours. Fasting Lipid Panel: No results for input(s): CHOL, HDL, LDLCALC, TRIG, CHOLHDL, LDLDIRECT in the last 72 hours. Thyroid  Function Tests: No results for input(s): TSH, T4TOTAL, T3FREE, THYROIDAB in the last 72 hours.  Invalid input(s): FREET3 Anemia Panel: No results for input(s): VITAMINB12, FOLATE, FERRITIN, TIBC, IRON , RETICCTPCT in the last 72 hours.    Radiology: US  RENAL Result Date: 03/04/2024 CLINICAL DATA:  Acute kidney injury. EXAM: RENAL / URINARY TRACT ULTRASOUND COMPLETE COMPARISON:  CT of the abdomen on 05/13/2023 FINDINGS: Right Kidney: Renal measurements: 12.9 x 5.1 x 5.2 cm = volume: 181 mL. Cortical atrophy. No hydronephrosis. Simple cyst of the upper pole measures up to approximately 2.9 cm in maximum diameter. Left Kidney: Renal measurements: 11.3 x 5.3 x 6.6 cm = volume: 207 mL. Cortical atrophy. No hydronephrosis or focal lesion. Bladder: Appears normal for degree of bladder distention. Other: None. IMPRESSION: Bilateral renal cortical atrophy. No hydronephrosis. Simple cyst of the upper pole of the right kidney. Electronically Signed   By: Marcey Moan M.D.   On: 03/04/2024  16:06   US  Venous Img Lower Bilateral Result Date: 02/27/2024 EXAM: ULTRASOUND DUPLEX OF THE BILATERAL LOWER EXTREMITY VEINS TECHNIQUE: Duplex ultrasound using B-mode/gray scaled imaging and Doppler spectral analysis and color flow was obtained of the deep venous structures of the bilateral lower extremity. COMPARISON: None. CLINICAL HISTORY: Bilateral leg swelling, worse on right side. FINDINGS: Bilateral calf veins are suboptimally visualized. The visualized veins of the lower extremity are patent and free of echogenic thrombus. The veins demonstrate good compressibility with normal color flow study and spectral analysis. IMPRESSION: 1. No evidence of DVT. Electronically signed by: Pinkie Pebbles MD 02/27/2024 02:49 AM EDT RP Workstation: [id number]   DG Chest Portable 1 View Result Date: 02/27/2024 CLINICAL DATA:  eval for edema EXAM: PORTABLE CHEST 1 VIEW COMPARISON:  Chest x-ray 05/31/2023, CT chest 12/06/2022, chest x-ray 12/06/2022 FINDINGS: The heart and mediastinal contours are unchanged. Atherosclerotic plaque. Right base linear atelectasis versus scarring. No focal consolidation. No pulmonary edema. Question trace bilateral pleural effusions. No pneumothorax. No acute osseous abnormality. Cervical spine surgical hardware partially visualized. IMPRESSION: 1. Question trace bilateral pleural effusions. Recommend further evaluation with chest x-ray PA and lateral view of the chest. 2.  Aortic Atherosclerosis (ICD10-I70.0). Electronically Signed   By: Morgane  Naveau M.D.   On: 02/27/2024 01:56   ECHO TEE Result Date: 02/23/2024    TRANSESOPHOGEAL ECHO REPORT   Patient Name:   HERSCHELL VIRANI Date of Exam: 02/22/2024 Medical Rec #:  [id number]          Height:       70.0 in Accession #:    [id number]         Weight:       230.0 lb  Date of Birth:  1945-11-01          BSA:          2.215 m Patient Age:    53 years           BP:           119/73 mmHg Patient Gender: M                  HR:            81 bpm. Exam Location:  [facility] Procedure: Transesophageal Echo, Cardiac Doppler and Color Doppler (Both            Spectral and Color Flow Doppler were utilized during procedure). Indications:     Persistent Afib I48.19  History:         Patient has prior history of Echocardiogram examinations, most                  recent 06/01/2023. Arrythmias:Atrial Fibrillation; Risk                  Factors:Hypertension.  Sonographer:     Christopher Furnace Referring Phys:  [id number] Aiya Keach Diagnosing Phys: Keller Paterson PROCEDURE: After discussion of the risks and benefits of a TEE, an informed consent was obtained from the patient. The transesophogeal probe was passed without difficulty through the esophogus of the patient. Local oropharyngeal anesthetic was provided with Cetacaine . Sedation performed by different physician. The patient was monitored while under deep sedation. Image quality was excellent. The patient's vital signs; including heart rate, blood pressure, and oxygen saturation; remained stable throughout the procedure. The patient developed no complications during the procedure.  IMPRESSIONS  1. Left ventricular ejection fraction, by estimation, is 55 to 60%. The left ventricle has normal function.  2. Right ventricular systolic function is normal. The right ventricular size is moderately enlarged.  3. Left atrial size was mildly dilated. No left atrial/left atrial appendage thrombus was detected.  4. Right atrial size was severely dilated.  5. The mitral valve is normal in structure. Mild mitral valve regurgitation.  6. Tricuspid valve regurgitation is moderate to severe.  7. The aortic valve is tricuspid. Aortic valve regurgitation is not visualized. FINDINGS  Left Ventricle: Left ventricular ejection fraction, by estimation, is 55 to 60%. The left ventricle has normal function. The left ventricular internal cavity size was normal in size. Right Ventricle: The right ventricular size is moderately enlarged. No  increase in right ventricular wall thickness. Right ventricular systolic function is normal. Left Atrium: Left atrial size was mildly dilated. No left atrial/left atrial appendage thrombus was detected. Right Atrium: Right atrial size was severely dilated. Pericardium: There is no evidence of pericardial effusion. Mitral Valve: The mitral valve is normal in structure. Mild mitral valve regurgitation. Tricuspid Valve: The tricuspid valve is normal in structure. Tricuspid valve regurgitation is moderate to severe. Aortic Valve: The aortic valve is tricuspid. Aortic valve regurgitation is not visualized. Pulmonic Valve: The pulmonic valve was normal in structure. Pulmonic valve regurgitation is trivial. Aorta: The aortic root is normal in size and structure. IAS/Shunts: No atrial level shunt detected by color flow Doppler. Keller Paterson Electronically signed by Keller Paterson Signature Date/Time: 02/23/2024/1:21:37 PM    Final     ECHO TEE 02/22/2024  1. Left ventricular ejection fraction, by estimation, is 55 to 60%. The left ventricle has normal function.   2. Right ventricular systolic function is normal. The right ventricular size is moderately enlarged.  3. Left atrial size was mildly dilated. No left atrial/left atrial appendage thrombus was detected.   4. Right atrial size was severely dilated.   5. The mitral valve is normal in structure. Mild mitral valve regurgitation.   6. Tricuspid valve regurgitation is moderate to severe.   7. The aortic valve is tricuspid. Aortic valve regurgitation is not visualized.   TELEMETRY reviewed by me 03/05/2024: Atrial fibrillation rate 70-80s  EKG reviewed by me: atrial fibrillation, rate 50s  Data reviewed by me 03/05/2024: last 24h vitals tele labs imaging I/O hospitalist progress notes.  Principal Problem:   Acute on chronic diastolic CHF (congestive heart failure) (HCC) Active Problems:   Myasthenia gravis (HCC)   Macrocytic anemia   Essential  hypertension   Paroxysmal atrial fibrillation (HCC)   Hypothyroidism   Parkinson disease (HCC)   Obesity (BMI 30-39.9)   Cellulitis    ASSESSMENT AND PLAN:  Francisco [name] is a 78 y.o. male  with a past medical history of persistent atrial fibrillation (recent successful TEE/DCCV on 06/12, HX ablation in 2023), hx paroxsymal SVT, chronic HFpEF (predominately right sided HF), Severe TR, moderate pulmonary hypertension, hypertension, hyperlipidemia, myasthenia gravis, COPD  who presented to the ED on 02/26/2024 for worsening lower extremity edema and weeping. Cardiology was consulted for further evaluation.   # Acute on chronic HFpEF # Paroxsymal atrial fibrillation(recent successful TEE/DCCV on 06/12, HX ablation 2023) # Hypertension # Hyperlipidemia # Moderate Pulmonary Hypertension Patient presents with worsening LEE and weeping. TEE from 06/12 with pEF (55-60%), mild MR, moderate to severe TR. CXR with bilaterally pulmonary congestion and trace bilateral pleural effusions. BNP elevated at 514. Per tele in AF rates 80-90s. -Hold torsemide  for today and tomorrow. Plan to resume PO torsemide  20 mg daily on Thursday (06/26). -Monitor and replenish electrolytes for a goal K >4. Will avoid aggressive magnesium  replacement due to myasthenia gravis to avoid exacerbation.  -Continue spironolactone  25 mg daily.  -Continue low dose metoprolol  tartrate 12.5 mg twice daily. Start at low dose due to myasthenia gravis.  -Continue Farxiga  10 mg daily. (Copay $47.0) -ContinuEliquis  5 mg twice daily for stroke risk reduction.  -Continue Amio 400 mg BID for 10 days, then 200 mg daily for rate and rhythm control  -Consider initiating ARB if BP becomes elevated.  -Recommend patient move and get out of bed and at least sit in bedside recliner.  -Patient pending SNF rehab placement. Will follow patient until discharge.   Patient awaiting SNF placement. Will arrange for follow up in clinic with Dr. Wilburn  in 2 weeks. Heart Failure appointment with Caralyn Hudson on 07/01 at [facility].  This patient's plan of care was discussed and created with Dr. Florencio and he is in agreement.  Signed: Dorene Comfort, PA-C  03/05/2024, 9:04 AM [facility] Cardiology

## 2024-03-05 NOTE — TOC Progression Note (Signed)
 Transition of Care [facility]) - Progression Note    Patient Details  Name: COREE BRAME MRN: [id number] Date of Birth: June 27, 1946  [facility] CM/SW Contact  Tomasa JAYSON Childes, RN Phone Number: 03/05/2024, 4:17 PM  Clinical Narrative:    Spoke with patient at bedside to give bed offers. Patient is agreeable to [facility]. He was advised auth approval would be required.   4:00pm Auth started by [name].  JluyPI:[id number]    Expected Discharge Plan: Home w Home Health Services Barriers to Discharge: Continued Medical Work up  Expected Discharge Plan and Services   Discharge Planning Services: CM Consult Post Acute Care Choice: Home Health Living arrangements for the past 2 months: Single Family Home                                       Social Determinants of Health (SDOH) Interventions SDOH Screenings   Food Insecurity: No Food Insecurity (02/27/2024)  Housing: Low Risk  (02/27/2024)  Transportation Needs: No Transportation Needs (02/27/2024)  Utilities: Not At Risk (02/27/2024)  Depression (PHQ2-9): Low Risk  (08/30/2021)  Financial Resource Strain: Low Risk  (02/14/2024)   Received from [facility]  Social Connections: Moderately Integrated (02/27/2024)  Tobacco Use: Medium Risk (02/26/2024)    Readmission Risk Interventions     No data to display

## 2024-03-05 NOTE — Progress Notes (Signed)
 [facility] Progress Note  Patient: Francisco [name]    FMW:[id number]  DOA: 02/26/2024     Date of Service: the patient was seen and examined on 03/05/2024  Chief Complaint  Patient presents with   Leg Swelling   Brief hospital course: [name] is [name] 78 y.o. male with medical history significant for paroxysmal [name]-fib on chronic anticoagulation therapy, stage III chronic kidney disease, chronic diastolic dysfunction CHF with last known LVEF of 55 to 60% from an echo TEE which was done 06/25, history of myasthenia gravis who presents to the emergency room for evaluation of bilateral lower extremity swelling. Patient is wheelchair-bound but notes that he has had worsening lower extremity swelling over the last several days despite taking his diuretics.  No shortness of breath or PND.  Patient was not compliant with his dietary restrictions.   On presentation vital stable, labs with BNP of 514, hemoglobin 10, bicarb 33 CXR with trace bilateral pleural effusions.EKG with [name]-fib. Bilateral lower extremity venous Doppler negative for DVT   Patient was admitted for IV diuresis.   Assessment and Plan:  # Acute on chronic diastolic CHF (congestive heart failure) Patient presents for evaluation of bilateral lower extremity swelling despite taking his diuretics as prescribed. Imaging shows trace bilateral pleural effusion, BNP elevated at 514. S/p IV Lasix , d/c;d on 6/20 due to elevated creatinine -Daily weight and BMP -Strict intake and output Cardiology consulted, started torsemide  from 6/21 Continued Aldactone  25 mg p.o. daily 6/23 sCr 1.08--1.42--1.67 got worse again today Held torsemide  today as per cardiology due to worsening of renal function.    # AKI due to diuresis Held torsemide  for now Monitor renal function and urine output sCr 1.42 today Bladder scan ruled out urinary retention US  renal: b/l renal cortical atrophy, no hydronephrosis.  Right kidney simple cyst  on upper pole.  Bladder appears normal for the degree of distention RN was advised to do bladder scan every shift    # Paroxysmal atrial fibrillation  Continue to monitor on telemetry Cardiology restarted amiodarone -apparently he has stopped taking his home meds. Continue with Eliquis  and metoprolol    # Cellulitis Patient has bilateral lower extremity weeping edema with areas of redness bilaterally over the anterior tibia and differential warmth Lower extremity venous Doppler negative for DVT S/p empirically treated with ceftriaxone  x 5 days   Elevate lower extremity   Essential hypertension Blood pressure is stable Monitor closely   Myasthenia gravis Continue prednisone  and azathioprine  Supportive care   Hypothyroidism Continue Synthroid    Parkinson disease - Continue home Sinemet    Macrocytic anemia Hemoglobin of 9.2 with MCV of 110. Anemia panel with normal iron  and low normal folate, B12 levels pending. - Continue to monitor and supplement as needed  Vitamin B12 level 376, goal >400, started B12 oral supplement.  Constipation, started laxatives. Patient received Dulcolax suppository on 6/24 and constipation resolved.   Body mass index is 30.97 kg/m.  Interventions:  Diet: 2 g sodium diet DVT Prophylaxis: Therapeutic Anticoagulation with Eliquis    Advance goals of care discussion: DNR-intervene  Family Communication: family was present at bedside, at the time of interview.  The pt provided permission to discuss medical plan with the family. Opportunity was given to ask question and all questions were answered satisfactorily.   Disposition:  Pt is from Home, admitted with CHF s/p IV lasix , overall patient's improving.  Patient was cleared by cardiology. 6/23 developed AKI due to diuresis.  Need to be monitored for renal functions Discharge  to SNF, awaiting for placement, in the meantime we will monitor renal functions. Follow TOC for disposition plan 6/24 as  per [facility] insurance Auth will be started today, awaiting for updated PT note  Subjective: No significant events overnight, patient was resting comfortably in the bed, denied any complaints.  Patient is constipated but no abdominal pain.  Patient agreed for Dulcolax suppository.    Physical Exam: General: NAD, lying comfortably Appear in no distress, affect appropriate Eyes: PERRLA ENT: Oral Mucosa Clear, moist  Neck: no JVD,  Cardiovascular: S1 and S2 Present, no Murmur,  Respiratory: good respiratory effort, Bilateral Air entry equal and Decreased, no Crackles, no wheezes Abdomen: Bowel Sound present, Soft and no tenderness,  Skin: no rashes Extremities: 1-2+ Pedal edema, compression bandages intact Neurologic: without any new focal findings Gait not checked due to patient safety concerns  Vitals:   03/05/24 0402 03/05/24 0500 03/05/24 0800 03/05/24 1253  BP: 105/62  96/67 102/70  Pulse: 71  75 77  Resp: 20  17 17   Temp: 98 F (36.7 C)  98.1 F (36.7 C) 97.9 F (36.6 C)  TempSrc:   Oral   SpO2: 96%  93% 98%  Weight:  97.9 kg    Height:        Intake/Output Summary (Last 24 hours) at 03/05/2024 1506 Last data filed at 03/05/2024 0900 Gross per 24 hour  Intake 240 ml  Output 550 ml  Net -310 ml   Filed Weights   03/03/24 0500 03/04/24 0335 03/05/24 0500  Weight: 98.2 kg 97.6 kg 97.9 kg    Data Reviewed: I have personally reviewed and interpreted daily labs, tele strips, imagings as discussed above. I reviewed all nursing notes, pharmacy notes, vitals, pertinent old records I have discussed plan of care as described above with RN and patient/family.  CBC: Recent Labs  Lab 02/28/24 0315 03/02/24 0518 03/03/24 0111 03/04/24 0211 03/05/24 0405  WBC 3.7* 5.8 6.0 6.3 6.0  HGB 9.2* 10.7* 11.0* 11.4* 10.9*  HCT 30.0* 34.6* 35.2* 35.8* 34.6*  MCV 110.7* 112.7* 110.7* 109.5* 109.8*  PLT 244 266 293 286 278   Basic Metabolic Panel: Recent Labs  Lab 03/01/24 0251  03/01/24 0820 03/02/24 0518 03/03/24 0111 03/04/24 0211 03/05/24 0405  NA 143  --  141 142 140 142  K 3.4*  --  4.4 4.4 4.0 4.0  CL 99  --  104 100 97* 102  CO2 34*  --  28 31 30  32  GLUCOSE 103*  --  100* 106* 106* 106*  BUN 24*  --  19 21 26* 27*  CREATININE 1.37*  --  1.08 1.42* 1.67* 1.42*  CALCIUM  8.2*  --  8.6* 8.8* 8.8* 8.7*  MG  --  1.7 2.0 2.0 2.0  --   PHOS  --  3.3 2.6 3.4 4.1  --     Studies: US  RENAL Result Date: 03/04/2024 CLINICAL DATA:  Acute kidney injury. EXAM: RENAL / URINARY TRACT ULTRASOUND COMPLETE COMPARISON:  CT of the abdomen on 05/13/2023 FINDINGS: Right Kidney: Renal measurements: 12.9 x 5.1 x 5.2 cm = volume: 181 mL. Cortical atrophy. No hydronephrosis. Simple cyst of the upper pole measures up to approximately 2.9 cm in maximum diameter. Left Kidney: Renal measurements: 11.3 x 5.3 x 6.6 cm = volume: 207 mL. Cortical atrophy. No hydronephrosis or focal lesion. Bladder: Appears normal for degree of bladder distention. Other: None. IMPRESSION: Bilateral renal cortical atrophy. No hydronephrosis. Simple cyst of the upper pole of the right  kidney. Electronically Signed   By: Marcey Moan M.D.   On: 03/04/2024 16:06    Scheduled Meds:  amiodarone   400 mg Oral BID   Followed by   NOREEN ON 03/09/2024] amiodarone   200 mg Oral Daily   apixaban   5 mg Oral BID   azaTHIOprine   150 mg Oral Daily   bisacodyl   10 mg Oral QHS   carbidopa -levodopa   1 tablet Oral TID   cholecalciferol   2,000 Units Oral Daily   cyanocobalamin   1,000 mcg Oral Daily   dapagliflozin  propanediol  10 mg Oral Daily   levothyroxine   50 mcg Oral Q0600   melatonin  5 mg Oral QHS   metoprolol  tartrate  12.5 mg Oral BID   polyethylene glycol  17 g Oral BID   predniSONE   10 mg Oral Q M,W,F   sodium chloride  flush  3 mL Intravenous Q12H   spironolactone   25 mg Oral Daily   Continuous Infusions:   PRN Meds: acetaminophen  **OR** acetaminophen , bisacodyl , meclizine, morphine  injection,  ondansetron  **OR** ondansetron  (ZOFRAN ) IV, sodium chloride  flush, traZODone   Time spent: 35 minutes  Author: ELVAN SOR. MD [facility] 03/05/2024 3:06 PM  To reach On-call, see care teams to locate the attending and reach out to them via [email or web address]. If 7PM-7AM, please contact night-coverage If you still have difficulty reaching the attending provider, please page the [facility] (Director on Call) for [facility] on amion for assistance.

## 2024-03-05 NOTE — Plan of Care (Signed)
   Problem: Education: Goal: Ability to demonstrate management of disease process will improve Outcome: Progressing Goal: Ability to verbalize understanding of medication therapies will improve Outcome: Progressing Goal: Individualized Educational Video(s) Outcome: Progressing   Problem: Activity: Goal: Capacity to carry out activities will improve Outcome: Progressing   Problem: Cardiac: Goal: Ability to achieve and maintain adequate cardiopulmonary perfusion will improve Outcome: Progressing   Problem: Education: Goal: Knowledge of General Education information will improve Description: Including pain rating scale, medication(s)/side effects and non-pharmacologic comfort measures Outcome: Progressing   Problem: Health Behavior/Discharge Planning: Goal: Ability to manage health-related needs will improve Outcome: Progressing   Problem: Clinical Measurements: Goal: Ability to maintain clinical measurements within normal limits will improve Outcome: Progressing Goal: Will remain free from infection Outcome: Progressing Goal: Diagnostic test results will improve Outcome: Progressing Goal: Respiratory complications will improve Outcome: Progressing Goal: Cardiovascular complication will be avoided Outcome: Progressing   Problem: Activity: Goal: Risk for activity intolerance will decrease Outcome: Progressing   Problem: Nutrition: Goal: Adequate nutrition will be maintained Outcome: Progressing   Problem: Coping: Goal: Level of anxiety will decrease Outcome: Progressing   Problem: Elimination: Goal: Will not experience complications related to bowel motility Outcome: Progressing Goal: Will not experience complications related to urinary retention Outcome: Progressing   Problem: Pain Managment: Goal: General experience of comfort will improve and/or be controlled Outcome: Progressing   Problem: Safety: Goal: Ability to remain free from injury will  improve Outcome: Progressing   Problem: Skin Integrity: Goal: Risk for impaired skin integrity will decrease Outcome: Progressing

## 2024-03-05 NOTE — Plan of Care (Signed)
  Problem: Education: Goal: Ability to demonstrate management of disease process will improve Outcome: Progressing Goal: Ability to verbalize understanding of medication therapies will improve Outcome: Progressing   Problem: Education: Goal: Ability to verbalize understanding of medication therapies will improve Outcome: Progressing   Problem: Education: Goal: Knowledge of General Education information will improve Description: Including pain rating scale, medication(s)/side effects and non-pharmacologic comfort measures Outcome: Progressing   Problem: Health Behavior/Discharge Planning: Goal: Ability to manage health-related needs will improve Outcome: Progressing

## 2024-03-05 NOTE — Progress Notes (Signed)
 Physical Therapy Treatment Patient Details Name: FAROUK VIVERO MRN: [id number] DOB: [birth year] Today's Date: 03/05/2024   History of Present Illness 78 y.o. male with medical history significant for paroxysmal A-fib on chronic anticoagulation therapy, stage III chronic kidney disease, chronic diastolic dysfunction CHF with last known LVEF of 55 to 60% from an echo TEE which was done 06/25, history of myasthenia gravis who presents to the emergency room for evaluation of bilateral lower extremity swelling.    PT Comments  Pt is making good progress towards goals with ability to ambulate short distance in room using RW. Step to gait pattern noted with L LE weakness and instability. Seated there-ex performed and pt very motivated to participate to best of ability. Of note, pt with BM in bed upon arrival with this writer assisting in hygiene during standing. Pt demonstrated good standing tolerance but required max assist for hygiene. Will continue to progress.   If plan is discharge home, recommend the following: A little help with bathing/dressing/bathroom;Help with stairs or ramp for entrance;Assist for transportation;Assistance with cooking/housework;A lot of help with walking and/or transfers   Can travel by private vehicle     Yes  Equipment Recommendations  None recommended by PT    Recommendations for Other Services       Precautions / Restrictions Precautions Precautions: Fall Recall of Precautions/Restrictions: Intact Restrictions Weight Bearing Restrictions Per Provider Order: No     Mobility  Bed Mobility Overal bed mobility: Needs Assistance Bed Mobility: Supine to Sit   Sidelying to sit: Min assist       General bed mobility comments: needs slight assist for chux pad adjustment and scooting out towards EOB.    Transfers Overall transfer level: Needs assistance Equipment used: Rolling walker (2 wheels) Transfers: Sit to/from Stand Sit to Stand: Min assist            General transfer comment: needs several rocking momentum and is able to stand with upright posture. Improved tolerance from previous session    Ambulation/Gait Ambulation/Gait assistance: Contact guard assist, Min assist Gait Distance (Feet): 15 Feet Assistive device: Rolling walker (2 wheels) Gait Pattern/deviations: Step-to pattern       General Gait Details: reports weakness and instability on L side. RW used with increased sway with exertion   Stairs             Wheelchair Mobility     Tilt Bed    Modified Rankin (Stroke Patients Only)       Balance Overall balance assessment: Needs assistance Sitting-balance support: No upper extremity supported, Feet supported Sitting balance-Leahy Scale: Good     Standing balance support: Bilateral upper extremity supported, Reliant on assistive device for balance Standing balance-Leahy Scale: Fair                              Hotel manager: No apparent difficulties  Cognition Arousal: Alert Behavior During Therapy: WFL for tasks assessed/performed   PT - Cognitive impairments: No apparent impairments                       PT - Cognition Comments: pleasant and agreeable to session Following commands: Intact      Cueing Cueing Techniques: Verbal cues  Exercises Other Exercises Other Exercises: seated ther-ex performed including LAQ, alt marching, and heel raises. 10 reps performed with pt rating mod effort    General Comments  Pertinent Vitals/Pain Pain Assessment Pain Assessment: No/denies pain    Home Living                          Prior Function            PT Goals (current goals can now be found in the care plan section) Acute Rehab PT Goals Patient Stated Goal: get stonger to go home PT Goal Formulation: With patient Time For Goal Achievement: 03/12/24 Potential to Achieve Goals: Good Progress towards PT goals:  Progressing toward goals    Frequency    Min 2X/week      PT Plan      Co-evaluation              AM-PAC PT 6 Clicks Mobility   Outcome Measure  Help needed turning from your back to your side while in a flat bed without using bedrails?: A Little Help needed moving from lying on your back to sitting on the side of a flat bed without using bedrails?: A Lot Help needed moving to and from a bed to a chair (including a wheelchair)?: A Little Help needed standing up from a chair using your arms (e.g., wheelchair or bedside chair)?: A Lot Help needed to walk in hospital room?: A Lot Help needed climbing 3-5 steps with a railing? : Total 6 Click Score: 13    End of Session Equipment Utilized During Treatment: Gait belt Activity Tolerance: Patient tolerated treatment well;Patient limited by fatigue Patient left: in chair Nurse Communication: Mobility status;Precautions;Other (comment) PT Visit Diagnosis: Muscle weakness (generalized) (M62.81);Difficulty in walking, not elsewhere classified (R26.2);Unsteadiness on feet (R26.81)     Time: 8476-8455 PT Time Calculation (min) (ACUTE ONLY): 21 min  Charges:    $Gait Training: 8-22 mins PT General Charges $$ ACUTE PT VISIT: 1 Visit                     Corean Dade, PT, DPT, GCS [phone]    Crystallee Werden 03/05/2024, 3:56 PM

## 2024-03-06 DIAGNOSIS — I5033 Acute on chronic diastolic (congestive) heart failure: Secondary | ICD-10-CM | POA: Diagnosis not present

## 2024-03-06 LAB — CBC
HCT: 35.6 % — ABNORMAL LOW (ref 39.0–52.0)
Hemoglobin: 11.4 g/dL — ABNORMAL LOW (ref 13.0–17.0)
MCH: 35.2 pg — ABNORMAL HIGH (ref 26.0–34.0)
MCHC: 32 g/dL (ref 30.0–36.0)
MCV: 109.9 fL — ABNORMAL HIGH (ref 80.0–100.0)
Platelets: 274 10*3/uL (ref 150–400)
RBC: 3.24 MIL/uL — ABNORMAL LOW (ref 4.22–5.81)
RDW: 15.7 % — ABNORMAL HIGH (ref 11.5–15.5)
WBC: 6.1 10*3/uL (ref 4.0–10.5)
nRBC: 0 % (ref 0.0–0.2)

## 2024-03-06 LAB — BASIC METABOLIC PANEL WITH GFR
Anion gap: 9 (ref 5–15)
BUN: 31 mg/dL — ABNORMAL HIGH (ref 8–23)
CO2: 29 mmol/L (ref 22–32)
Calcium: 8.7 mg/dL — ABNORMAL LOW (ref 8.9–10.3)
Chloride: 101 mmol/L (ref 98–111)
Creatinine, Ser: 1.53 mg/dL — ABNORMAL HIGH (ref 0.61–1.24)
GFR, Estimated: 46 mL/min — ABNORMAL LOW (ref >60)
Glucose, Bld: 141 mg/dL — ABNORMAL HIGH (ref 70–99)
Potassium: 3.9 mmol/L (ref 3.5–5.1)
Sodium: 139 mmol/L (ref 135–145)

## 2024-03-06 MED ORDER — POLYETHYLENE GLYCOL 3350 17 G PO PACK: 17.0000 g | PACK | Freq: Every day | ORAL | Status: DC

## 2024-03-06 MED ORDER — TORSEMIDE 20 MG PO TABS: 20.0000 mg | ORAL_TABLET | Freq: Every day | ORAL | Status: DC

## 2024-03-06 MED ORDER — DAPAGLIFLOZIN PROPANEDIOL 10 MG PO TABS: 10.0000 mg | ORAL_TABLET | Freq: Every day | ORAL | Status: DC

## 2024-03-06 MED ORDER — MELATONIN 3 MG PO TABS: 3.0000 mg | ORAL_TABLET | Freq: Every day | ORAL | Status: DC

## 2024-03-06 MED ORDER — SODIUM CHLORIDE 0.9 % IV BOLUS: 250.0000 mL | Freq: Once | INTRAVENOUS | Status: DC

## 2024-03-06 MED ORDER — AMIODARONE HCL 200 MG PO TABS: ORAL_TABLET | ORAL | Status: DC

## 2024-03-06 MED ORDER — BISACODYL 5 MG PO TBEC: 10.0000 mg | DELAYED_RELEASE_TABLET | Freq: Every evening | ORAL | Status: DC | PRN

## 2024-03-06 MED ORDER — HYDROCODONE-ACETAMINOPHEN 10-325 MG PO TABS: 1.0000 | ORAL_TABLET | Freq: Four times a day (QID) | ORAL | 0 refills | Status: DC | PRN

## 2024-03-06 MED ORDER — BISACODYL 10 MG RE SUPP: 10.0000 mg | Freq: Every day | RECTAL | Status: DC | PRN

## 2024-03-06 MED ORDER — SPIRONOLACTONE 25 MG PO TABS: 25.0000 mg | ORAL_TABLET | Freq: Every day | ORAL | Status: AC

## 2024-03-06 NOTE — TOC Progression Note (Signed)
 Transition of Care [facility]) - Progression Note    Patient Details  Name: Francisco [name] MRN: [id number] Date of Birth: 05-14-1946  [facility] CM/SW Contact  Tomasa JAYSON Childes, RN Phone Number: 03/06/2024, 8:40 AM  Clinical Narrative:  Per [facility] assistant, [name]  Approved PlanAuthID: [id number] Dates:6/24-6/26/25 Next Review Date: 03/07/2024   MD made aware of auth approval   Spoke with [name], Admissions Director, patient can admit today. MD notified.    Expected Discharge Plan: Home w Home Health Services Barriers to Discharge: Continued Medical Work up  Expected Discharge Plan and Services   Discharge Planning Services: CM Consult Post Acute Care Choice: Home Health Living arrangements for the past 2 months: Single Family Home                                       Social Determinants of Health (SDOH) Interventions SDOH Screenings   Food Insecurity: No Food Insecurity (02/27/2024)  Housing: Low Risk  (02/27/2024)  Transportation Needs: No Transportation Needs (02/27/2024)  Utilities: Not At Risk (02/27/2024)  Depression (PHQ2-9): Low Risk  (08/30/2021)  Financial Resource Strain: Low Risk  (02/14/2024)   [facility]  Social Connections: Moderately Integrated (02/27/2024)  Tobacco Use: Medium Risk (02/26/2024)    Readmission Risk Interventions     No data to display

## 2024-03-06 NOTE — Plan of Care (Signed)
  Problem: Education: Goal: Ability to demonstrate management of disease process will improve Outcome: Progressing Goal: Ability to verbalize understanding of medication therapies will improve Outcome: Progressing   Problem: Activity: Goal: Capacity to carry out activities will improve Outcome: Progressing   Problem: Activity: Goal: Risk for activity intolerance will decrease Outcome: Progressing   Problem: Nutrition: Goal: Adequate nutrition will be maintained Outcome: Progressing

## 2024-03-06 NOTE — TOC Transition Note (Signed)
 Transition of Care [facility]) - Discharge Note   Patient Details  Name: Francisco [name] MRN: [id number] Date of Birth: 1946/01/04  [facility] CM/SW Contact:  Francisco JAYSON Childes, Francisco [name] Phone Number: 03/06/2024, 12:51 PM   Clinical Narrative:    Beatris with [name]  in admissions at [facility] Per facility patient admission confirmed for today. Patient assigned room # 804 Report will be called to [phone] Face sheet and medical necessity forms printed to the floor to be added to the EMS pack EMS arranged  There are none ahead of him. Discharge summary and SNF transfer report sent in HUB.  Nurse, and patient provided with details of discharge.  TOC signing off.    Final next level of care: Skilled Nursing Facility Barriers to Discharge: Barriers Resolved   Patient Goals and CMS Choice Patient states their goals for this hospitalization and ongoing recovery are:: SNF CMS Medicare.gov Compare Post Acute Care list provided to:: Patient        Discharge Placement              Patient chooses bed at: Other - please specify in the comment section below: [facility]) Patient to be transferred to facility by: [facility]   Patient and family notified of of transfer: 03/06/24  Discharge Plan and Services Additional resources added to the After Visit Summary for     Discharge Planning Services: CM Consult Post Acute Care Choice: Home Health                               Social Drivers of Health (SDOH) Interventions SDOH Screenings   Food Insecurity: No Food Insecurity (02/27/2024)  Housing: Low Risk  (02/27/2024)  Transportation Needs: No Transportation Needs (02/27/2024)  Utilities: Not At Risk (02/27/2024)  Depression (PHQ2-9): Low Risk  (08/30/2021)  Financial Resource Strain: Low Risk  (02/14/2024)   Received from [facility]  Social Connections: Moderately Integrated (02/27/2024)  Tobacco Use: Medium Risk (02/26/2024)      Readmission Risk Interventions     No data to display

## 2024-03-06 NOTE — Discharge Summary (Signed)
 [facility] Discharge Summary   Patient: Francisco [name] FMW:[id number]  PCP: Fernande Ophelia JINNY DOUGLAS, MD  Date of admission: 02/26/2024   Date of discharge:  03/06/2024     Discharge Diagnoses:  Principal Problem:   Acute on chronic diastolic CHF (congestive heart failure) (HCC) Active Problems:   Paroxysmal atrial fibrillation (HCC)   Cellulitis   Essential hypertension   Myasthenia gravis (HCC)   Hypothyroidism   Parkinson disease (HCC)   Macrocytic anemia   Obesity (BMI 30-39.9)   Admitted From: Home Disposition:  SNF   Recommendations for Outpatient Follow-up:  Follow-up with PCP, patient to be seen by an MD in 1 to 2 days, continue to monitor BP and heart rate to titrate medication accordingly.  Repeat BMP after 1 week and then restart torsemide  20 mg p.o. daily if renal functions improve Patient was started on bolus dose amiodarone  for 10 days followed by regular dose daily.  Discontinued beta-blocker for now due to soft blood pressure, started Aldactone  and Farxiga  as per cards. follow up with cardiology in clinic with Dr. Wilburn in 2 weeks. Heart Failure appointment with Alfredia Bloch on 07/01 at [facility]. Follow up LABS/TEST: BMP in 1 week Continue to monitor LFTs and TFTs periodically due to amiodarone  and follow with pulmonary for baseline PFTs   Contact information for follow-up providers     Alluri, Keller BROCKS, MD. Go in 2 week(s).   Specialty: Cardiology Contact information: [address] [place] [address] [phone]         Hudson, Caralyn, PA-C. Go in 1 week(s).   Specialty: Cardiology Why: 07/01 @ 2PM Contact information: [address] [place] [address] [phone]              Contact information for after-discharge care     Destination     [facility] .   Service: Skilled Nursing Contact information: [address] [place]  [address] [phone]                     Diet recommendation: Cardiac diet  Activity: The patient is advised to gradually reintroduce usual activities, as tolerated  Discharge Condition: stable  Code Status: DNR-intervene  History of present illness: As per the H and P dictated on admission.  Hospital Course:  Francisco [name] is a 78 y.o. male with medical history significant for paroxysmal A-fib on chronic anticoagulation therapy, stage III chronic kidney disease, chronic diastolic dysfunction CHF with last known LVEF of 55 to 60% from an echo TEE which was done 06/25, history of myasthenia gravis who presents to the emergency room for evaluation of bilateral lower extremity swelling. Patient is wheelchair-bound but notes that he has had worsening lower extremity swelling over the last several days despite taking his diuretics.  No shortness of breath or PND.  Patient was not compliant with his dietary restrictions.   On presentation vital stable, labs with BNP of 514, hemoglobin 10, bicarb 33 CXR with trace bilateral pleural effusions.EKG with A-fib. Bilateral lower extremity venous Doppler negative for DVT   Patient was admitted for IV diuresis.     Assessment and Plan:   # Acute on chronic diastolic CHF (congestive heart failure) Patient presents for evaluation of bilateral lower extremity swelling despite taking his diuretics as prescribed. Imaging shows trace bilateral pleural effusion, BNP elevated at 514. S/p IV Lasix , d/c;d on 6/20 due to elevated creatinine.  Please check daily weights and monitor  intake and output Cardiology consulted, started torsemide  from 6/21 Started Aldactone  25 mg p.o. daily 6/25 sCr 1.08---1.67--1.53 still elevated, continue to hold torsemide  and repeat BMP next week, if renal functions improved then restart torsemide  20 mg p.o. daily.  Discontinued Toprol -XL due to low blood pressure.  Can be resumed once blood pressure improves  # AKI due to diuresis, Held torsemide  for  now Monitor renal function and urine output. sCr 1.53 today Bladder scan ruled out urinary retention US  renal: b/l renal cortical atrophy, no hydronephrosis.  Right kidney simple cyst on upper pole.  Bladder appears normal for the degree of distention.   # Paroxysmal atrial fibrillation  Cardiology restarted amiodarone -apparently he has stopped taking his home meds.  Continue amiodarone  400 mg p.o. twice daily for 10 days followed by 20 mg p.o. daily. Continue Eliquis  for stroke prophylaxis.  Discontinued metoprolol  due to low blood pressure.    # Cellulitis of bilateral lower extremities Patient has bilateral lower extremity weeping edema with areas of redness bilaterally over the anterior tibia and differential warmth Lower extremity venous Doppler negative for DVT S/p empirically treated with ceftriaxone  x 5 days.  Keep legs elevated and continue pressure bandages  # Essential hypertension: Blood pressure is running soft, discontinue Toprol -XL.  Continue to monitor.  Patient has been started on Aldactone  and Farxiga  as per cardiology.   # Myasthenia gravis Continue prednisone  and azathioprine .  Continue supportive care # Hypothyroidism: Continue Synthroid  # Parkinson disease: Continue home Sinemet  # Macrocytic anemia: Hgb 11.4 today, MCV 109, Anemia panel with normal iron  and low normal folate, B12 levels 376. - Continue to monitor and supplement as needed # Vitamin B12 level 376, goal >400, started B12 oral supplement. # Constipation, started laxatives. Patient received Dulcolax suppository on 6/24 and constipation resolved.   Body mass index is 31.09 kg/m.  Nutrition Interventions:  Pain control  - [place]  Controlled Substance Reporting System database could not be reviewed as website was not working.   - Norco 10 tablets prescription given as per SNF requirement - Patient was instructed, not to drive, operate heavy machinery, perform activities at heights, swimming or  participation in water activities or provide baby sitting services while on Pain, Sleep and Anxiety Medications; until his outpatient Physician has advised to do so again.  - Also recommended to not to take more than prescribed Pain, Sleep and Anxiety Medications.  Patient was seen by physical therapy, who recommended Therapy, SNF placement, which was arranged. On the day of the discharge the patient's vitals were stable, and no other acute medical condition were reported by patient. the patient was felt safe to be discharge at SNF with Therapy.  Consultants: Cardiology Procedures: None  Discharge Exam: General: Appear in no distress, no Rash; Oral Mucosa Clear, moist. Cardiovascular: Irregular rhythm, rate controlled, no Murmur, Respiratory: normal respiratory effort, Bilateral Air entry present and no Crackles, no wheezes Abdomen: Bowel Sound present, Soft and no tenderness, no hernia Extremities: 2+ Pedal edema, ace wraps intact, mild tenderness Neurology: alert and oriented to time, place, and person affect appropriate.  Filed Weights   03/04/24 0335 03/05/24 0500 03/06/24 0511  Weight: 97.6 kg 97.9 kg 98.3 kg   Vitals:   03/06/24 0822 03/06/24 1100  BP: (!) 96/57 114/75  Pulse: 75 73  Resp:    Temp: 97.6 F (36.4 C) 98 F (36.7 C)  SpO2: 98% 97%    DISCHARGE MEDICATION: Allergies as of 03/06/2024       Reactions  Azithromycin Other (See Comments)   Avoid due to myasthenia gravis   Codeine Nausea And Vomiting        Medication List     STOP taking these medications    metoprolol  tartrate 25 MG tablet Commonly known as: LOPRESSOR    traZODone  50 MG tablet Commonly known as: DESYREL        TAKE these medications    amiodarone  200 MG tablet Commonly known as: PACERONE  Take 2 tablets (400 mg total) by mouth 2 (two) times daily for 3 days, THEN 1 tablet (200 mg total) daily. Start taking on: March 06, 2024 What changed: See the new instructions.    azaTHIOprine  50 MG tablet Commonly known as: IMURAN  Take 150 mg by mouth daily.   bisacodyl  5 MG EC tablet Commonly known as: DULCOLAX Take 2 tablets (10 mg total) by mouth at bedtime as needed for moderate constipation.   bisacodyl  10 MG suppository Commonly known as: DULCOLAX Place 1 suppository (10 mg total) rectally daily as needed for severe constipation.   carbidopa -levodopa  25-100 MG tablet Commonly known as: SINEMET  IR Take 1 tablet by mouth 3 (three) times daily.   cyanocobalamin  1000 MCG tablet Commonly known as: VITAMIN B12 Take 1,000 mcg by mouth daily.   D3-1000 25 MCG (1000 UT) capsule Generic drug: Cholecalciferol  Take 2,000 Units by mouth daily.   dapagliflozin  propanediol 10 MG Tabs tablet Commonly known as: FARXIGA  Take 1 tablet (10 mg total) by mouth daily. Start taking on: March 07, 2024   Eliquis  5 MG Tabs tablet Generic drug: apixaban  Take 5 mg by mouth 2 (two) times daily.   HYDROcodone -acetaminophen  10-325 MG tablet Commonly known as: NORCO Take 1-2 tablets by mouth every 6 (six) hours as needed for moderate pain (pain score 4-6) or severe pain (pain score 7-10).   levothyroxine  50 MCG tablet Commonly known as: SYNTHROID  Take 1 tablet (50 mcg total) by mouth daily at 6 (six) AM.   meclizine 25 MG tablet Commonly known as: ANTIVERT Take 1 tablet by mouth 3 (three) times daily as needed for dizziness.   melatonin 3 MG Tabs tablet Take 1 tablet (3 mg total) by mouth at bedtime.   polyethylene glycol 17 g packet Commonly known as: MIRALAX  / GLYCOLAX  Take 17 g by mouth daily. Skip the dose if no constipation   predniSONE  10 MG tablet Commonly known as: DELTASONE  Take 10 mg by mouth every Monday, Wednesday, and Friday.   pregabalin  50 MG capsule Commonly known as: LYRICA  Take 50 mg by mouth 2 (two) times daily.   spironolactone  25 MG tablet Commonly known as: ALDACTONE  Take 1 tablet (25 mg total) by mouth daily. Start taking on: March 07, 2024   torsemide  20 MG tablet Commonly known as: DEMADEX  Take 1 tablet (20 mg total) by mouth daily. If renal functions improve, check BMP before restarting Start taking on: March 11, 2024               Discharge Care Instructions  (From admission, onward)           Start     Ordered   03/06/24 0000  Discharge wound care:       Comments: As above   03/06/24 1242           Allergies  Allergen Reactions   Azithromycin Other (See Comments)    Avoid due to myasthenia gravis   Codeine Nausea And Vomiting   Discharge Instructions     Call MD for:  difficulty  breathing, headache or visual disturbances   Complete by: As directed    Call MD for:  extreme fatigue   Complete by: As directed    Call MD for:  persistant dizziness or light-headedness   Complete by: As directed    Call MD for:  persistant nausea and vomiting   Complete by: As directed    Call MD for:  redness, tenderness, or signs of infection (pain, swelling, redness, odor or green/yellow discharge around incision site)   Complete by: As directed    Call MD for:  severe uncontrolled pain   Complete by: As directed    Call MD for:  temperature >100.4   Complete by: As directed    Diet - low sodium heart healthy   Complete by: As directed    Discharge instructions   Complete by: As directed    Follow-up with PCP, patient to be seen by an MD in 1 to 2 days, continue to monitor BP and heart rate to titrate medication accordingly.  Repeat BMP after 1 week and then restart torsemide  20 mg p.o. daily if renal functions improve Patient was started on bolus dose amiodarone  for 10 days followed by regular dose daily.  Discontinued beta-blocker for now due to soft blood pressure, started Aldactone  and Farxiga  as per cards. follow up with cardiology in clinic with Dr. Wilburn in 2 weeks. Heart Failure appointment with Caralyn Hudson on 07/01 at [facility].   Discharge wound care:   Complete by: As directed    As above    Increase activity slowly   Complete by: As directed        The results of significant diagnostics from this hospitalization (including imaging, microbiology, ancillary and laboratory) are listed below for reference.    Significant Diagnostic Studies: US  RENAL Result Date: 03/04/2024 CLINICAL DATA:  Acute kidney injury. EXAM: RENAL / URINARY TRACT ULTRASOUND COMPLETE COMPARISON:  CT of the abdomen on 05/13/2023 FINDINGS: Right Kidney: Renal measurements: 12.9 x 5.1 x 5.2 cm = volume: 181 mL. Cortical atrophy. No hydronephrosis. Simple cyst of the upper pole measures up to approximately 2.9 cm in maximum diameter. Left Kidney: Renal measurements: 11.3 x 5.3 x 6.6 cm = volume: 207 mL. Cortical atrophy. No hydronephrosis or focal lesion. Bladder: Appears normal for degree of bladder distention. Other: None. IMPRESSION: Bilateral renal cortical atrophy. No hydronephrosis. Simple cyst of the upper pole of the right kidney. Electronically Signed   By: Marcey Moan M.D.   On: 03/04/2024 16:06   US  Venous Img Lower Bilateral Result Date: 02/27/2024 EXAM: ULTRASOUND DUPLEX OF THE BILATERAL LOWER EXTREMITY VEINS TECHNIQUE: Duplex ultrasound using B-mode/gray scaled imaging and Doppler spectral analysis and color flow was obtained of the deep venous structures of the bilateral lower extremity. COMPARISON: None. CLINICAL HISTORY: Bilateral leg swelling, worse on right side. FINDINGS: Bilateral calf veins are suboptimally visualized. The visualized veins of the lower extremity are patent and free of echogenic thrombus. The veins demonstrate good compressibility with normal color flow study and spectral analysis. IMPRESSION: 1. No evidence of DVT. Electronically signed by: Pinkie Pebbles MD 02/27/2024 02:49 AM EDT RP Workstation: [id number]   DG Chest Portable 1 View Result Date: 02/27/2024 CLINICAL DATA:  eval for edema EXAM: PORTABLE CHEST 1 VIEW COMPARISON:  Chest x-ray 05/31/2023, CT chest 12/06/2022,  chest x-ray 12/06/2022 FINDINGS: The heart and mediastinal contours are unchanged. Atherosclerotic plaque. Right base linear atelectasis versus scarring. No focal consolidation. No pulmonary edema. Question trace bilateral pleural effusions. No pneumothorax.  No acute osseous abnormality. Cervical spine surgical hardware partially visualized. IMPRESSION: 1. Question trace bilateral pleural effusions. Recommend further evaluation with chest x-ray PA and lateral view of the chest. 2.  Aortic Atherosclerosis (ICD10-I70.0). Electronically Signed   By: Morgane  Naveau M.D.   On: 02/27/2024 01:56   ECHO TEE Result Date: 02/23/2024    TRANSESOPHOGEAL ECHO REPORT   Patient Name:   LUTHER SPRINGS Date of Exam: 02/22/2024 Medical Rec #:  [id number]          Height:       70.0 in Accession #:    [id number]         Weight:       230.0 lb Date of Birth:  08-05-1946          BSA:          2.215 m Patient Age:    78 years           BP:           119/73 mmHg Patient Gender: M                  HR:           81 bpm. Exam Location:  [facility] Procedure: Transesophageal Echo, Cardiac Doppler and Color Doppler (Both            Spectral and Color Flow Doppler were utilized during procedure). Indications:     Persistent Afib I48.19  History:         Patient has prior history of Echocardiogram examinations, most                  recent 06/01/2023. Arrythmias:Atrial Fibrillation; Risk                  Factors:Hypertension.  Sonographer:     Christopher Furnace Referring Phys:  [id number] GABRIELLA DECOSTE Diagnosing Phys: Keller Paterson PROCEDURE: After discussion of the risks and benefits of a TEE, an informed consent was obtained from the patient. The transesophogeal probe was passed without difficulty through the esophogus of the patient. Local oropharyngeal anesthetic was provided with Cetacaine . Sedation performed by different physician. The patient was monitored while under deep sedation. Image quality was excellent. The patient's vital signs;  including heart rate, blood pressure, and oxygen saturation; remained stable throughout the procedure. The patient developed no complications during the procedure.  IMPRESSIONS  1. Left ventricular ejection fraction, by estimation, is 55 to 60%. The left ventricle has normal function.  2. Right ventricular systolic function is normal. The right ventricular size is moderately enlarged.  3. Left atrial size was mildly dilated. No left atrial/left atrial appendage thrombus was detected.  4. Right atrial size was severely dilated.  5. The mitral valve is normal in structure. Mild mitral valve regurgitation.  6. Tricuspid valve regurgitation is moderate to severe.  7. The aortic valve is tricuspid. Aortic valve regurgitation is not visualized. FINDINGS  Left Ventricle: Left ventricular ejection fraction, by estimation, is 55 to 60%. The left ventricle has normal function. The left ventricular internal cavity size was normal in size. Right Ventricle: The right ventricular size is moderately enlarged. No increase in right ventricular wall thickness. Right ventricular systolic function is normal. Left Atrium: Left atrial size was mildly dilated. No left atrial/left atrial appendage thrombus was detected. Right Atrium: Right atrial size was severely dilated. Pericardium: There is no evidence of pericardial effusion. Mitral Valve: The mitral valve is normal in structure. Mild mitral valve regurgitation.  Tricuspid Valve: The tricuspid valve is normal in structure. Tricuspid valve regurgitation is moderate to severe. Aortic Valve: The aortic valve is tricuspid. Aortic valve regurgitation is not visualized. Pulmonic Valve: The pulmonic valve was normal in structure. Pulmonic valve regurgitation is trivial. Aorta: The aortic root is normal in size and structure. IAS/Shunts: No atrial level shunt detected by color flow Doppler. Keller Paterson Electronically signed by Keller Paterson Signature Date/Time: 02/23/2024/1:21:37 PM     Final     Microbiology: No results found for this or any previous visit (from the past 240 hours).   Labs: CBC: Recent Labs  Lab 03/02/24 0518 03/03/24 0111 03/04/24 0211 03/05/24 0405 03/06/24 0355  WBC 5.8 6.0 6.3 6.0 6.1  HGB 10.7* 11.0* 11.4* 10.9* 11.4*  HCT 34.6* 35.2* 35.8* 34.6* 35.6*  MCV 112.7* 110.7* 109.5* 109.8* 109.9*  PLT 266 293 286 278 274   Basic Metabolic Panel: Recent Labs  Lab 03/01/24 0820 03/02/24 0518 03/03/24 0111 03/04/24 0211 03/05/24 0405 03/06/24 0355  NA  --  141 142 140 142 139  K  --  4.4 4.4 4.0 4.0 3.9  CL  --  104 100 97* 102 101  CO2  --  28 31 30  32 29  GLUCOSE  --  100* 106* 106* 106* 141*  BUN  --  19 21 26* 27* 31*  CREATININE  --  1.08 1.42* 1.67* 1.42* 1.53*  CALCIUM   --  8.6* 8.8* 8.8* 8.7* 8.7*  MG 1.7 2.0 2.0 2.0  --   --   PHOS 3.3 2.6 3.4 4.1  --   --    Liver Function Tests: No results for input(s): AST, ALT, ALKPHOS, BILITOT, PROT, ALBUMIN  in the last 168 hours. No results for input(s): LIPASE, AMYLASE in the last 168 hours. No results for input(s): AMMONIA in the last 168 hours. Cardiac Enzymes: No results for input(s): CKTOTAL, CKMB, CKMBINDEX, TROPONINI in the last 168 hours. BNP (last 3 results) Recent Labs    05/31/23 1706 02/26/24 1750 03/04/24 0211  BNP 473.5* 514.3* 256.7*   CBG: No results for input(s): GLUCAP in the last 168 hours.  Time spent: 35 minutes  Signed:  Elvan Sor  [facility] 03/06/2024 12:43 PM

## 2024-03-06 NOTE — Progress Notes (Signed)
 [facility] CLINIC CARDIOLOGY PROGRESS NOTE       Patient ID: Francisco [name] MRN: [id number] DOB/AGE: [birth year] 78 y.o.  Admit date: 02/26/2024 Referring Physician Dr. Trudy Primary Physician Fernande Ophelia JINNY DOUGLAS, MD Primary Cardiologist Dr. Wilburn Reason for Consultation [name]  HPI: Francisco [name] is a 78 y.o. male  with a past medical history of persistent atrial fibrillation (recent successful TEE/DCCV on 06/12, HX ablation in 2023), hx paroxsymal SVT, chronic HFpEF (predominately right sided HF), Severe TR, moderate pulmonary hypertension, hypertension, hyperlipidemia, myasthenia gravis, COPD  who presented to the ED on 02/26/2024 for worsening lower extremity edema and weeping. Cardiology was consulted for further evaluation.   Interval History: -Patient seen and examined this AM and laying comfortably in hospital bed. Patient states he feels good this AM. Patient appears euvolemic. Denies chest pain, SOB or orthopnea. LEE improved and at baseline.  -Patients BP borderline and HR stable this AM. Per tele in AF with rates 70-80s -Slight bump in Cr function today 1.42 > 1.67 > 1.42 > 1.53 -Patient remains on room air with stable SpO2.  -Patient accepted for SNF rehab placement today.  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   Carotid stenosis    CHF (congestive heart failure) (HCC)    CKD (chronic kidney disease)    CKD (chronic kidney disease) stage 3, GFR 30-59 ml/min (HCC)    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   DDD (degenerative disc disease), cervical    Dysrhythmia    post carotid stent  bradycardia; PAF 09/2020   GERD (gastroesophageal reflux disease)    Hypercholesterolemia    Hypertension    Hypothyroidism    pt takes Levothyroxine  daily   Lumbosacral spinal stenosis    Myasthenia gravis, adult form (HCC)    PAD (peripheral artery disease) (HCC)    Parkinson disease (HCC)    Shortness of breath    Lung MD- Dr Burnett Servant   Sleep apnea    do not use CPAP every night   Stroke (HCC) 05/31/2023    Past Surgical History:  Procedure Laterality Date   ANTERIOR CERVICAL DECOMP/DISCECTOMY FUSION  07/18/2011   Procedure: ANTERIOR CERVICAL DECOMPRESSION/DISCECTOMY FUSION 2 LEVELS;  Surgeon: Victory LABOR Pool;  Location: [facility];  Service: Neurosurgery;  Laterality: N/A;  cervical five-six, cervical six-seven anterior cervical discectomy and fusion   ATRIAL FIBRILLATION ABLATION     BACK SURGERY     in 1985 [facility]   BILATERAL CARPAL TUNNEL RELEASE     01/2020 Right, 04/2020 Left   CARDIAC CATHETERIZATION     2005 at [facility], no stents   CARDIOVERSION N/A 02/22/2024   Procedure: CARDIOVERSION;  Surgeon: Alluri, Keller BROCKS, MD;  Location: [facility] ORS;  Service: Cardiovascular;  Laterality: N/A;   CAROTID PTA/STENT INTERVENTION N/A 09/17/2020   Procedure: CAROTID PTA/STENT INTERVENTION;  Surgeon: Marea Selinda RAMAN, MD;  Location: [facility] INVASIVE CV LAB;  Service: Cardiovascular;  Laterality: N/A;   CATARACT EXTRACTION W/PHACO Left 01/06/2020   Procedure: CATARACT EXTRACTION PHACO AND INTRAOCULAR LENS  PLACEMENT (IOC) ISTENT INJ LEFT 3.81  00:33.3;  Surgeon: Myrna Adine Anes, MD;  Location: [facility]ERY [facility];  Service: Ophthalmology;  Laterality: Left;   CATARACT EXTRACTION W/PHACO Right 02/03/2020   Procedure: CATARACT EXTRACTION PHACO AND INTRAOCULAR LENS PLACEMENT (IOC) RIGHT ISTENT INJ;  Surgeon: Myrna Adine Anes, MD;  Location: [facility];  Service: Ophthalmology;  Laterality: Right;  4.29 0:35.6   COLONOSCOPY     HERNIA REPAIR Left    inguinal hernia repair in 1985    LUMBAR LAMINECTOMY/DECOMPRESSION MICRODISCECTOMY Left 02/24/2014   Procedure: LUMBAR LAMINECTOMY/DECOMPRESSION MICRODISCECTOMY LUMBAR THREE-FOUR, FOUR-FIVE, LEFT FIVE-SACRAL ONE ;  Surgeon: Victory DELENA Gunnels, MD;  Location: [facility];  Service: Neurosurgery;  Laterality: Left;  LUMBAR LAMINECTOMY/DECOMPRESSION MICRODISCECTOMY LUMBAR THREE-FOUR, FOUR-FIVE, LEFT FIVE-SACRAL ONE    LUMBAR LAMINECTOMY/DECOMPRESSION MICRODISCECTOMY N/A 05/03/2021   Procedure: Laminectomy and Foraminotomy - L2-L3;  Surgeon: Gunnels Victory, MD;  Location: [facility] OR;  Service: Neurosurgery;  Laterality: N/A;  3C   POSTERIOR CERVICAL FUSION/FORAMINOTOMY N/A 08/07/2020   Procedure: C3-6 POSTERIOR FUSION WITH DECOMPRESSION;  Surgeon: Clois Fret, MD;  Location: [facility] ORS;  Service: Neurosurgery;  Laterality: N/A;   PROSTATECTOMY  04/2013   [facility] Dr ozell Geralds    TEE WITHOUT CARDIOVERSION N/A 02/22/2024   Procedure: ECHOCARDIOGRAM, TRANSESOPHAGEAL;  Surgeon: Wilburn Keller BROCKS, MD;  Location: [facility] ORS;  Service: Cardiovascular;  Laterality: N/A;    Medications Prior to Admission  Medication Sig Dispense Refill Last Dose/Taking   apixaban  (ELIQUIS ) 5 MG TABS tablet Take 5 mg by mouth 2 (two) times daily.   02/26/2024 at  8:00 PM   azaTHIOprine  (IMURAN ) 50 MG tablet Take 150 mg by mouth daily.    02/26/2024 at  9:00 AM   carbidopa -levodopa  (SINEMET  IR) 25-100 MG tablet Take 1 tablet by mouth 3 (three) times daily.   02/26/2024 at  8:00 PM   Cholecalciferol  (D3-1000) 25 MCG (1000 UT) capsule Take 2,000 Units by mouth daily.   02/26/2024 Morning   cyanocobalamin  (VITAMIN B12) 1000 MCG tablet Take 1,000 mcg by mouth daily.   02/26/2024 Morning   HYDROcodone -acetaminophen  (NORCO) 10-325 MG tablet Take 1-2 tablets by mouth every 6 (six) hours as needed for moderate pain (pain score 4-6) or severe pain (pain score 7-10).   Unknown   levothyroxine  (SYNTHROID ) 50 MCG tablet Take 1 tablet (50 mcg total) by mouth daily at 6 (six) AM. 30  tablet 0 02/26/2024 Morning   meclizine (ANTIVERT) 25 MG tablet Take 1 tablet by mouth 3 (three) times daily as needed for dizziness.   Unknown   metoprolol  tartrate (LOPRESSOR ) 25 MG tablet Take 25 mg by mouth daily.   02/26/2024 Morning   predniSONE  (DELTASONE ) 10 MG tablet Take 10 mg by mouth every Monday, Wednesday, and Friday.   02/26/2024 Morning   pregabalin  (LYRICA ) 50 MG capsule Take 50 mg by mouth 2 (two) times daily.   02/26/2024 Evening   amiodarone  (PACERONE ) 200 MG tablet Take 200 mg by mouth daily. (Patient not taking: Reported on 02/27/2024)   Not Taking   traZODone  (DESYREL ) 50 MG tablet Take 50 mg by mouth at bedtime as needed for sleep. (Patient not taking: Reported on 02/27/2024)   Not Taking   Social History   Socioeconomic History   Marital status: Widowed    Spouse name: [name]   Number of children: Not on file   Years of education: Not on file   Highest education level: Not on file  Occupational History   Not on file  Tobacco  Use   Smoking status: Former    Current packs/day: 0.00    Average packs/day: 1 pack/day for 20.0 years (20.0 ttl pk-yrs)    Types: Cigarettes    Start date: 09/12/1981    Quit date: 09/12/2001    Years since quitting: 22.4   Smokeless tobacco: Never  Vaping Use   Vaping status: Never Used  Substance and Sexual Activity   Alcohol use: Yes    Alcohol/week: 3.0 standard drinks of alcohol    Types: 3 Glasses of wine per week    Comment: 3 glasses a wine a week   Drug use: No   Sexual activity: Yes  Other Topics Concern   Not on file  Social History Narrative   Not on file   Social Drivers of Health   Financial Resource Strain: Low Risk  (02/14/2024)   Received from [facility]   Overall Financial Resource Strain (CARDIA)    Difficulty of Paying Living Expenses: Not hard at all  Food Insecurity: No Food Insecurity (02/27/2024)   Hunger Vital Sign    Worried About Running Out of Food in the Last Year: Never true    Ran Out  of Food in the Last Year: Never true  Transportation Needs: No Transportation Needs (02/27/2024)   PRAPARE - Administrator, Civil Service (Medical): No    Lack of Transportation (Non-Medical): No  Physical Activity: Not on file  Stress: Not on file  Social Connections: Moderately Integrated (02/27/2024)   Social Connection and Isolation Panel    Frequency of Communication with Friends and Family: More than three times a week    Frequency of Social Gatherings with Friends and Family: Three times a week    Attends Religious Services: More than 4 times per year    Active Member of Clubs or Organizations: Yes    Attends Banker Meetings: More than 4 times per year    Marital Status: Widowed  Intimate Partner Violence: Unknown (02/27/2024)   Humiliation, Afraid, Rape, and Kick questionnaire    Fear of Current or Ex-Partner: Not on file    Emotionally Abused: Not on file    Physically Abused: No    Sexually Abused: Not on file    Family History  Problem Relation Age of Onset   Hypertension Mother    Stroke Mother    Stroke Father      Vitals:   03/05/24 2011 03/05/24 2044 03/05/24 2310 03/06/24 0511  BP: (!) 97/55 (!) 101/56 103/62 99/61  Pulse: 74 72 62 72  Resp: 20  18 18   Temp: 98.3 F (36.8 C)  97.7 F (36.5 C) 97.6 F (36.4 C)  TempSrc:      SpO2: 98%  95% 96%  Weight:    98.3 kg  Height:        PHYSICAL EXAM General: Well-appearing elderly male, well nourished, in no acute distress. HEENT: Normocephalic and atraumatic. Neck: No JVD.   Lungs: Normal respiratory effort on room air.  CTAB  Heart: Irregular, irregular, controlled rates. S1, S2, + Murmur Abdomen: Non-distended appearing.  Msk: Normal strength and tone for age. Extremities: Warm and well perfused. No clubbing, cyanosis, edema Neuro: Alert and oriented X 3. Psych: Answers questions appropriately.   Labs: Basic Metabolic Panel: Recent Labs    03/04/24 0211 03/05/24 0405  03/06/24 0355  NA 140 142 139  K 4.0 4.0 3.9  CL 97* 102 101  CO2 30 32 29  GLUCOSE 106*  106* 141*  BUN 26* 27* 31*  CREATININE 1.67* 1.42* 1.53*  CALCIUM  8.8* 8.7* 8.7*  MG 2.0  --   --   PHOS 4.1  --   --    Liver Function Tests: No results for input(s): AST, ALT, ALKPHOS, BILITOT, PROT, ALBUMIN  in the last 72 hours. No results for input(s): LIPASE, AMYLASE in the last 72 hours. CBC: Recent Labs    03/05/24 0405 03/06/24 0355  WBC 6.0 6.1  HGB 10.9* 11.4*  HCT 34.6* 35.6*  MCV 109.8* 109.9*  PLT 278 274   Cardiac Enzymes: No results for input(s): CKTOTAL, CKMB, CKMBINDEX, TROPONINIHS in the last 72 hours. BNP: Recent Labs    03/04/24 0211  BNP 256.7*   D-Dimer: No results for input(s): DDIMER in the last 72 hours. Hemoglobin A1C: No results for input(s): HGBA1C in the last 72 hours. Fasting Lipid Panel: No results for input(s): CHOL, HDL, LDLCALC, TRIG, CHOLHDL, LDLDIRECT in the last 72 hours. Thyroid  Function Tests: No results for input(s): TSH, T4TOTAL, T3FREE, THYROIDAB in the last 72 hours.  Invalid input(s): FREET3 Anemia Panel: No results for input(s): VITAMINB12, FOLATE, FERRITIN, TIBC, IRON , RETICCTPCT in the last 72 hours.    Radiology: US  RENAL Result Date: 03/04/2024 CLINICAL DATA:  Acute kidney injury. EXAM: RENAL / URINARY TRACT ULTRASOUND COMPLETE COMPARISON:  CT of the abdomen on 05/13/2023 FINDINGS: Right Kidney: Renal measurements: 12.9 x 5.1 x 5.2 cm = volume: 181 mL. Cortical atrophy. No hydronephrosis. Simple cyst of the upper pole measures up to approximately 2.9 cm in maximum diameter. Left Kidney: Renal measurements: 11.3 x 5.3 x 6.6 cm = volume: 207 mL. Cortical atrophy. No hydronephrosis or focal lesion. Bladder: Appears normal for degree of bladder distention. Other: None. IMPRESSION: Bilateral renal cortical atrophy. No hydronephrosis. Simple cyst of the upper pole of the right  kidney. Electronically Signed   By: Marcey Moan M.D.   On: 03/04/2024 16:06   US  Venous Img Lower Bilateral Result Date: 02/27/2024 EXAM: ULTRASOUND DUPLEX OF THE BILATERAL LOWER EXTREMITY VEINS TECHNIQUE: Duplex ultrasound using B-mode/gray scaled imaging and Doppler spectral analysis and color flow was obtained of the deep venous structures of the bilateral lower extremity. COMPARISON: None. CLINICAL HISTORY: Bilateral leg swelling, worse on right side. FINDINGS: Bilateral calf veins are suboptimally visualized. The visualized veins of the lower extremity are patent and free of echogenic thrombus. The veins demonstrate good compressibility with normal color flow study and spectral analysis. IMPRESSION: 1. No evidence of DVT. Electronically signed by: Pinkie Pebbles MD 02/27/2024 02:49 AM EDT RP Workstation: [id number]   DG Chest Portable 1 View Result Date: 02/27/2024 CLINICAL DATA:  eval for edema EXAM: PORTABLE CHEST 1 VIEW COMPARISON:  Chest x-ray 05/31/2023, CT chest 12/06/2022, chest x-ray 12/06/2022 FINDINGS: The heart and mediastinal contours are unchanged. Atherosclerotic plaque. Right base linear atelectasis versus scarring. No focal consolidation. No pulmonary edema. Question trace bilateral pleural effusions. No pneumothorax. No acute osseous abnormality. Cervical spine surgical hardware partially visualized. IMPRESSION: 1. Question trace bilateral pleural effusions. Recommend further evaluation with chest x-ray PA and lateral view of the chest. 2.  Aortic Atherosclerosis (ICD10-I70.0). Electronically Signed   By: Morgane  Naveau M.D.   On: 02/27/2024 01:56   ECHO TEE Result Date: 02/23/2024    TRANSESOPHOGEAL ECHO REPORT   Patient Name:   Francisco [name] Date of Exam: 02/22/2024 Medical Rec #:  [id number]          Height:       70.0 in Accession #:  [id number]         Weight:       230.0 lb Date of Birth:  1946-02-16          BSA:          2.215 m Patient Age:    35 years            BP:           119/73 mmHg Patient Gender: M                  HR:           81 bpm. Exam Location:  [facility] Procedure: Transesophageal Echo, Cardiac Doppler and Color Doppler (Both            Spectral and Color Flow Doppler were utilized during procedure). Indications:     Persistent Afib I48.19  History:         Patient has prior history of Echocardiogram examinations, most                  recent 06/01/2023. Arrythmias:Atrial Fibrillation; Risk                  Factors:Hypertension.  Sonographer:     Christopher Furnace Referring Phys:  [id number] Eduard Penkala Diagnosing Phys: Keller Paterson PROCEDURE: After discussion of the risks and benefits of a TEE, an informed consent was obtained from the patient. The transesophogeal probe was passed without difficulty through the esophogus of the patient. Local oropharyngeal anesthetic was provided with Cetacaine . Sedation performed by different physician. The patient was monitored while under deep sedation. Image quality was excellent. The patient's vital signs; including heart rate, blood pressure, and oxygen saturation; remained stable throughout the procedure. The patient developed no complications during the procedure.  IMPRESSIONS  1. Left ventricular ejection fraction, by estimation, is 55 to 60%. The left ventricle has normal function.  2. Right ventricular systolic function is normal. The right ventricular size is moderately enlarged.  3. Left atrial size was mildly dilated. No left atrial/left atrial appendage thrombus was detected.  4. Right atrial size was severely dilated.  5. The mitral valve is normal in structure. Mild mitral valve regurgitation.  6. Tricuspid valve regurgitation is moderate to severe.  7. The aortic valve is tricuspid. Aortic valve regurgitation is not visualized. FINDINGS  Left Ventricle: Left ventricular ejection fraction, by estimation, is 55 to 60%. The left ventricle has normal function. The left ventricular internal cavity size was normal in  size. Right Ventricle: The right ventricular size is moderately enlarged. No increase in right ventricular wall thickness. Right ventricular systolic function is normal. Left Atrium: Left atrial size was mildly dilated. No left atrial/left atrial appendage thrombus was detected. Right Atrium: Right atrial size was severely dilated. Pericardium: There is no evidence of pericardial effusion. Mitral Valve: The mitral valve is normal in structure. Mild mitral valve regurgitation. Tricuspid Valve: The tricuspid valve is normal in structure. Tricuspid valve regurgitation is moderate to severe. Aortic Valve: The aortic valve is tricuspid. Aortic valve regurgitation is not visualized. Pulmonic Valve: The pulmonic valve was normal in structure. Pulmonic valve regurgitation is trivial. Aorta: The aortic root is normal in size and structure. IAS/Shunts: No atrial level shunt detected by color flow Doppler. Keller Paterson Electronically signed by Keller Paterson Signature Date/Time: 02/23/2024/1:21:37 PM    Final     ECHO TEE 02/22/2024  1. Left ventricular ejection fraction, by estimation, is 55 to 60%. The left ventricle has normal  function.   2. Right ventricular systolic function is normal. The right ventricular size is moderately enlarged.   3. Left atrial size was mildly dilated. No left atrial/left atrial appendage thrombus was detected.   4. Right atrial size was severely dilated.   5. The mitral valve is normal in structure. Mild mitral valve regurgitation.   6. Tricuspid valve regurgitation is moderate to severe.   7. The aortic valve is tricuspid. Aortic valve regurgitation is not visualized.   TELEMETRY reviewed by me 03/06/2024: Atrial fibrillation rate 70-80s  EKG reviewed by me: atrial fibrillation, rate 50s  Data reviewed by me 03/06/2024: last 24h vitals tele labs imaging I/O hospitalist progress notes.  Principal Problem:   Acute on chronic diastolic CHF (congestive heart failure) (HCC) Active  Problems:   Myasthenia gravis (HCC)   Macrocytic anemia   Essential hypertension   Paroxysmal atrial fibrillation (HCC)   Hypothyroidism   Parkinson disease (HCC)   Obesity (BMI 30-39.9)   Cellulitis    ASSESSMENT AND PLAN:  Francisco [name] is a 78 y.o. male  with a past medical history of persistent atrial fibrillation (recent successful TEE/DCCV on 06/12, HX ablation in 2023), hx paroxsymal SVT, chronic HFpEF (predominately right sided HF), Severe TR, moderate pulmonary hypertension, hypertension, hyperlipidemia, myasthenia gravis, COPD  who presented to the ED on 02/26/2024 for worsening lower extremity edema and weeping. Cardiology was consulted for further evaluation.   # Acute on chronic HFpEF # Paroxsymal atrial fibrillation(recent successful TEE/DCCV on 06/12, HX ablation 2023) # Hypertension # Hyperlipidemia # Moderate Pulmonary Hypertension Patient presents with worsening LEE and weeping. TEE from 06/12 with pEF (55-60%), mild MR, moderate to severe TR. CXR with bilaterally pulmonary congestion and trace bilateral pleural effusions. BNP elevated at 514. Per tele in AF rates 70-80s.  -Hold torsemide  for today. Plan to resume PO torsemide  20 mg daily in a few days. -Recommend gentle fluids for patients elevated Cr. -Monitor and replenish electrolytes for a goal K >4. Will avoid aggressive magnesium  replacement due to myasthenia gravis to avoid exacerbation.  -Continue spironolactone  25 mg daily.  -D/c metoprolol  tartrate 12.5 mg BID due to soft BP.  -Continue Farxiga  10 mg daily. (Copay $47.0) -ContinuEliquis  5 mg twice daily for stroke risk reduction.  -Continue Amio 400 mg BID for 10 days, then 200 mg daily for rate and rhythm control  -Recommend patient move and get out of bed and at least sit in bedside recliner.  -Patient accepted for SNF rehab placement today.  Patient awaiting SNF placement. No further cardiac recommendations at this time. Will sign off. Will arrange  for follow up in clinic with Dr. Wilburn in 2 weeks. Heart Failure appointment with Caralyn Hudson on 07/01 at [facility].  This patient's plan of care was discussed and created with Dr. Florencio and he is in agreement.  Signed: Dorene Comfort, PA-C  03/06/2024, 7:04 AM [facility] Cardiology

## 2024-03-07 ENCOUNTER — Ambulatory Visit: Payer: Medicare Other

## 2024-03-12 ENCOUNTER — Ambulatory Visit: Payer: Medicare Other

## 2024-03-14 ENCOUNTER — Ambulatory Visit: Payer: Medicare Other

## 2024-03-19 ENCOUNTER — Ambulatory Visit: Payer: Medicare Other

## 2024-03-20 ENCOUNTER — Emergency Department
Admission: EM | Admit: 2024-03-20 | Discharge: 2024-03-21 | Disposition: A | Discharge status: Home / Self Care | Attending: Emergency Medicine | Admitting: Emergency Medicine

## 2024-03-20 ENCOUNTER — Other Ambulatory Visit: Payer: Self-pay

## 2024-03-20 DIAGNOSIS — R531 Weakness: Secondary | ICD-10-CM | POA: Insufficient documentation

## 2024-03-20 LAB — BASIC METABOLIC PANEL WITH GFR
Anion gap: 8 (ref 5–15)
BUN: 33 mg/dL — ABNORMAL HIGH (ref 8–23)
CO2: 27 mmol/L (ref 22–32)
Calcium: 8.7 mg/dL — ABNORMAL LOW (ref 8.9–10.3)
Chloride: 103 mmol/L (ref 98–111)
Creatinine, Ser: 1.27 mg/dL — ABNORMAL HIGH (ref 0.61–1.24)
GFR, Estimated: 57 mL/min — ABNORMAL LOW (ref >60)
Glucose, Bld: 102 mg/dL — ABNORMAL HIGH (ref 70–99)
Potassium: 4.3 mmol/L (ref 3.5–5.1)
Sodium: 138 mmol/L (ref 135–145)

## 2024-03-20 LAB — CBC WITH DIFFERENTIAL/PLATELET
Abs Immature Granulocytes: 0.08 K/uL — ABNORMAL HIGH (ref 0.00–0.07)
Basophils Absolute: 0 K/uL (ref 0.0–0.1)
Basophils Relative: 0 %
Eosinophils Absolute: 0 K/uL (ref 0.0–0.5)
Eosinophils Relative: 0 %
HCT: 34.1 % — ABNORMAL LOW (ref 39.0–52.0)
Hemoglobin: 11 g/dL — ABNORMAL LOW (ref 13.0–17.0)
Immature Granulocytes: 1 %
Lymphocytes Relative: 6 %
Lymphs Abs: 0.4 K/uL — ABNORMAL LOW (ref 0.7–4.0)
MCH: 35 pg — ABNORMAL HIGH (ref 26.0–34.0)
MCHC: 32.3 g/dL (ref 30.0–36.0)
MCV: 108.6 fL — ABNORMAL HIGH (ref 80.0–100.0)
Monocytes Absolute: 0.7 K/uL (ref 0.1–1.0)
Monocytes Relative: 9 %
Neutro Abs: 6.5 K/uL (ref 1.7–7.7)
Neutrophils Relative %: 84 %
Platelets: 184 K/uL (ref 150–400)
RBC: 3.14 MIL/uL — ABNORMAL LOW (ref 4.22–5.81)
RDW: 15.3 % (ref 11.5–15.5)
WBC: 7.7 K/uL (ref 4.0–10.5)
nRBC: 0 % (ref 0.0–0.2)

## 2024-03-20 LAB — TROPONIN I (HIGH SENSITIVITY): Troponin I (High Sensitivity): 13 ng/L (ref <18)

## 2024-03-20 NOTE — ED Triage Notes (Addendum)
 Pt presented to ED BIBA with c/o fall. States he was unloading his grocery cart when his legs folded underneath of him around 2045. Pt was recently admitted for peripheral edema 2 weeks prior. Denies hitting head. Normally uses cane, walker, and electric scooter to get around his home. Pt A&Ox4. On Eliquis . Denies pain, has no complaints at this time. No obvious deformity noted.

## 2024-03-20 NOTE — ED Notes (Signed)
 Pt provided with sandwich, water, and warm blankets. No other comfort measures requested at this time.

## 2024-03-20 NOTE — ED Provider Notes (Signed)
 [facility] Provider Note    Event Date/Time   First MD Initiated Contact with Patient 03/20/24 2216     (approximate)   History   Fall   HPI {Remember to add pertinent medical, surgical, social, and/or OB history to HPI:1} Francisco [name] is a 78 y.o. male  ***       Physical Exam   Triage Vital Signs: ED Triage Vitals  Encounter Vitals Group     BP 03/20/24 2152 106/62     Girls Systolic BP Percentile --      Girls Diastolic BP Percentile --      Boys Systolic BP Percentile --      Boys Diastolic BP Percentile --      Pulse Rate 03/20/24 2152 89     Resp 03/20/24 2152 18     Temp 03/20/24 2152 97.9 F (36.6 C)     Temp Source 03/20/24 2152 Oral     SpO2 03/20/24 2151 100 %     Weight 03/20/24 2153 199 lb 14.4 oz (90.7 kg)     Height 03/20/24 2153 5' 10 (1.778 m)     Head Circumference --      Peak Flow --      Pain Score 03/20/24 2153 0     Pain Loc --      Pain Education --      Exclude from Growth Chart --     Most recent vital signs: Vitals:   03/20/24 2151 03/20/24 2152  BP:  106/62  Pulse:  89  Resp:  18  Temp:  97.9 F (36.6 C)  SpO2: 100% 97%    {Only need to document appropriate and relevant physical exam:1} General: Awake, no distress. *** CV:  Good peripheral perfusion. *** Resp:  Normal effort. *** Abd:  No distention. *** Other:  ***   ED Results / Procedures / Treatments   Labs (all labs ordered are listed, but only abnormal results are displayed) Labs Reviewed - No data to display   EKG  I, Guadalupe Eagles, attending physician, personally viewed and interpreted this EKG  EKG Time: 2152 Rate: 94 Rhythm: atrial fibrillation Axis: normal Intervals: qtc 627 QRS: low voltage ST changes: no st elevation Impression: abnormal ekg   RADIOLOGY *** {USE THE WORD INTERPRETED!! You MUST document your own interpretation of imaging, as well as the fact that you reviewed the radiologist's  report!:1}   PROCEDURES:  Critical Care performed: Yes  CRITICAL CARE Performed by: Guadalupe Eagles   Total critical care time: *** minutes  Critical care time was exclusive of separately billable procedures and treating other patients.  Critical care was necessary to treat or prevent imminent or life-threatening deterioration.  Critical care was time spent personally by me on the following activities: development of treatment plan with patient and/or surrogate as well as nursing, discussions with consultants, evaluation of patient's response to treatment, examination of patient, obtaining history from patient or surrogate, ordering and performing treatments and interventions, ordering and review of laboratory studies, ordering and review of radiographic studies, pulse oximetry and re-evaluation of patient's condition.   Procedures    MEDICATIONS ORDERED IN ED: Medications - No data to display   IMPRESSION / MDM / ASSESSMENT AND PLAN / ED COURSE  I reviewed the triage vital signs and the nursing notes.  Differential diagnosis includes, but is not limited to, ***  Patient's presentation is most consistent with {EM COPA:27473}   ***The patient is on the cardiac monitor to evaluate for evidence of arrhythmia and/or significant heart rate changes.  ***      FINAL CLINICAL IMPRESSION(S) / ED DIAGNOSES   Final diagnoses:  None        Rx / DC Orders   ED Discharge Orders     None        Note:  This document was prepared using Dragon voice recognition software and may include unintentional dictation errors.

## 2024-03-20 NOTE — ED Notes (Signed)
Goodman MD at bedside. 

## 2024-03-20 NOTE — ED Notes (Signed)
 Pt ambulated in room with walker. Pt had slow, shuffling gait, which pt states is his baseline. Pt states feels mildly weaker in left leg. States earlier today he was not able to stand/ walk. Floy MD aware.

## 2024-03-21 ENCOUNTER — Ambulatory Visit: Payer: Medicare Other

## 2024-03-21 NOTE — ED Notes (Signed)
 Pt educated on discharge instructions. Pt instructed to follow up with PCP. Pt verbalized understanding of discharge instructions. Pt attempting to reach family for a safe ride home.

## 2024-03-21 NOTE — ED Notes (Signed)
 Per Floy MD, pt does not need repeat troponin prior to discharge.

## 2024-03-26 ENCOUNTER — Ambulatory Visit: Payer: Medicare Other

## 2024-03-28 ENCOUNTER — Ambulatory Visit: Payer: Medicare Other

## 2024-04-02 ENCOUNTER — Ambulatory Visit: Payer: Medicare Other

## 2024-04-04 ENCOUNTER — Ambulatory Visit: Payer: Medicare Other

## 2024-04-22 ENCOUNTER — Encounter (INDEPENDENT_AMBULATORY_CARE_PROVIDER_SITE_OTHER): Payer: Self-pay | Admitting: Vascular Surgery

## 2024-04-22 ENCOUNTER — Ambulatory Visit (INDEPENDENT_AMBULATORY_CARE_PROVIDER_SITE_OTHER): Admitting: Vascular Surgery

## 2024-04-22 VITALS — BP 135/79 | HR 93 | Resp 18 | Wt 222.0 lb

## 2024-04-22 DIAGNOSIS — I480 Paroxysmal atrial fibrillation: Secondary | ICD-10-CM | POA: Diagnosis not present

## 2024-04-22 DIAGNOSIS — I890 Lymphedema, not elsewhere classified: Secondary | ICD-10-CM | POA: Diagnosis not present

## 2024-04-22 DIAGNOSIS — I10 Essential (primary) hypertension: Secondary | ICD-10-CM | POA: Diagnosis not present

## 2024-04-22 DIAGNOSIS — E7800 Pure hypercholesterolemia, unspecified: Secondary | ICD-10-CM

## 2024-05-03 ENCOUNTER — Other Ambulatory Visit: Payer: Self-pay | Admitting: Family

## 2024-05-09 ENCOUNTER — Encounter (INDEPENDENT_AMBULATORY_CARE_PROVIDER_SITE_OTHER): Payer: Self-pay | Admitting: Vascular Surgery

## 2024-05-09 NOTE — Progress Notes (Signed)
 Subjective:    Patient ID: Francisco [name], male    DOB: 1945/11/20, 78 y.o.   MRN: [id number] Chief Complaint  Patient presents with   Follow-up    Ref Marikay consult edema of both legs    Francisco [name] is a 78 yo male who presents to clinic today with chief complaint of bilateral lower extremity swelling. He endorses leg weakness with gradual worsening of leg weakness, potentially exacerbated by fluid overload and recent hospitalization. He has a history of myasthenia gravis, cervical myelopathy, and recent fall which contribute to the condition. He was seen last month by his Internal Medicine doctor who recommended fluid reduction to enhance leg mobility. He was also set up for home health physical therapy to improve leg strength and mobility. He denies any pain to his bilateral lower extremities.  When he is at his most swollen he states he gets a throbbing and aching which then transitions into pain if he does not elevate his legs.  He states he is always had right greater than left swelling to his lower extremities.  He endorses that most of this started to get worse after his lumbar surgery which left him with some paralyzation.  He denies any wounds/ulcers or skin tears to his lower extremities.  He denies any recent infection to his lower extremities.     Review of Systems  Constitutional: Negative.   Cardiovascular:  Positive for leg swelling.  Musculoskeletal:  Positive for arthralgias, back pain, gait problem and myalgias.  Skin:  Positive for color change.  All other systems reviewed and are negative.      Objective:   Physical Exam Constitutional:      Appearance: Normal appearance. He is obese.  HENT:     Head: Normocephalic.  Eyes:     Pupils: Pupils are equal, round, and reactive to light.  Cardiovascular:     Rate and Rhythm: Normal rate and regular rhythm.     Pulses: Normal pulses.     Heart sounds: Normal heart sounds.  Pulmonary:     Effort:  Pulmonary effort is normal.     Breath sounds: Normal breath sounds.  Abdominal:     General: Bowel sounds are normal.     Palpations: Abdomen is soft.  Musculoskeletal:        General: Swelling and tenderness present.     Right lower leg: Edema present.     Left lower leg: Edema present.  Skin:    General: Skin is warm and dry.     Capillary Refill: Capillary refill takes 2 to 3 seconds.  Neurological:     General: No focal deficit present.     Mental Status: He is alert and oriented to person, place, and time. Mental status is at baseline.     Motor: Weakness present.     Gait: Gait abnormal.  Psychiatric:        Mood and Affect: Mood normal.        Behavior: Behavior normal.        Thought Content: Thought content normal.        Judgment: Judgment normal.     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   DDD (degenerative disc disease), cervical    Dysrhythmia    post carotid stent bradycardia; PAF 09/2020   GERD (gastroesophageal reflux disease)    Hypercholesterolemia    Hypertension    Hypothyroidism    pt takes Levothyroxine  daily   Lumbosacral spinal stenosis    Myasthenia gravis, adult form (HCC)    PAD (peripheral artery disease) (HCC)    Parkinson disease (HCC)    Shortness of breath    Lung MD- Dr Burnett Servant   Sleep apnea    do not use CPAP every night   Stroke (HCC) 05/31/2023     Social History   Socioeconomic History   Marital status: Widowed    Spouse name: [name]   Number of children: Not on file   Years of education: Not on file   Highest education level: Not on file  Occupational History   Not on file  Tobacco Use   Smoking status: Former    Current packs/day: 0.00    Average packs/day: 1 pack/day for 20.0 years (20.0 ttl pk-yrs)    Types: Cigarettes    Start date: 09/12/1981    Quit date: 09/12/2001    Years since quitting: 22.6   Smokeless tobacco: Never  Vaping Use   Vaping status: Never Used  Substance and Sexual Activity   Alcohol use: Yes    Alcohol/week: 3.0 standard drinks of alcohol    Types: 3 Glasses of wine per week    Comment: 3 glasses a wine a week   Drug use: No   Sexual activity: Yes  Other Topics Concern   Not on file  Social History Narrative   Not on file   Social Drivers of Health   Financial Resource Strain: Low Risk  (04/30/2024)   Received from [facility]   Overall Financial Resource Strain (CARDIA)    Difficulty of Paying Living Expenses: Not hard at all  Food Insecurity: No Food Insecurity (04/30/2024)   Received from [facility]   Hunger Vital Sign    Within the past 12 months, you worried that your food would run out before you got the money to buy more.: Never true    Within the past 12 months, the food you bought just didn't last and you didn't have money to get more.: Never true  Transportation Needs: No Transportation Needs (04/30/2024)   Received from [facility] - Transportation    In the past 12 months, has lack of transportation kept you from medical appointments or from getting medications?: No    Lack of Transportation (Non-Medical): No  Physical Activity: Not on file  Stress: Not on file  Social Connections: Moderately Integrated (02/27/2024)   Social Connection and Isolation Panel    Frequency of Communication with Friends and Family:  More than three times a week    Frequency of Social Gatherings with Friends and Family: Three times a week    Attends Religious Services: More than 4 times per year  Active Member of Clubs or Organizations: Yes    Attends Banker Meetings: More than 4 times per year    Marital Status: Widowed  Intimate Partner Violence: Unknown (02/27/2024)   Humiliation, Afraid, Rape, and Kick questionnaire    Fear of Current or Ex-Partner: Not on file    Emotionally Abused: Not on file    Physically Abused: No    Sexually Abused: Not on file    Past Surgical History:  Procedure Laterality Date   ANTERIOR CERVICAL DECOMP/DISCECTOMY FUSION  07/18/2011   Procedure: ANTERIOR CERVICAL DECOMPRESSION/DISCECTOMY FUSION 2 LEVELS;  Surgeon: Victory LABOR Pool;  Location: [facility];  Service: Neurosurgery;  Laterality: N/A;  cervical five-six, cervical six-seven anterior cervical discectomy and fusion   ATRIAL FIBRILLATION ABLATION     BACK SURGERY     in 1985 [facility]   BILATERAL CARPAL TUNNEL RELEASE     01/2020 Right, 04/2020 Left   CARDIAC CATHETERIZATION     2005 at [facility], no stents   CARDIOVERSION N/A 02/22/2024   Procedure: CARDIOVERSION;  Surgeon: Alluri, Keller BROCKS, MD;  Location: [facility] ORS;  Service: Cardiovascular;  Laterality: N/A;   CAROTID PTA/STENT INTERVENTION N/A 09/17/2020   Procedure: CAROTID PTA/STENT INTERVENTION;  Surgeon: Marea Selinda RAMAN, MD;  Location: [facility] IN[facility];  Service: Cardiovascular;  Laterality: N/A;   CATARACT EXTRACTION W/PHACO Left 01/06/2020   Procedure: CATARACT EXTRACTION PHACO AND INTRAOCULAR LENS PLACEMENT (IOC) ISTENT INJ LEFT 3.81  00:33.3;  Surgeon: Myrna Adine Anes, MD;  Location: [facility];  Service: Ophthalmology;  Laterality: Left;   CATARACT EXTRACTION W/PHACO Right 02/03/2020   Procedure: CATARACT EXTRACTION PHACO AND INTRAOCULAR LENS PLACEMENT (IOC) RIGHT ISTENT INJ;  Surgeon: Myrna Adine Anes, MD;  Location: [facility];   Service: Ophthalmology;  Laterality: Right;  4.29 0:35.6   COLONOSCOPY     HERNIA REPAIR Left    inguinal hernia repair in 1985   LUMBAR LAMINECTOMY/DECOMPRESSION MICRODISCECTOMY Left 02/24/2014   Procedure: LUMBAR LAMINECTOMY/DECOMPRESSION MICRODISCECTOMY LUMBAR THREE-FOUR, FOUR-FIVE, LEFT FIVE-SACRAL ONE ;  Surgeon: Victory LABOR Gunnels, MD;  Location: [facility];  Service: Neurosurgery;  Laterality: Left;  LUMBAR LAMINECTOMY/DECOMPRESSION MICRODISCECTOMY LUMBAR THREE-FOUR, FOUR-FIVE, LEFT FIVE-SACRAL ONE    LUMBAR LAMINECTOMY/DECOMPRESSION MICRODISCECTOMY N/A 05/03/2021   Procedure: Laminectomy and Foraminotomy - L2-L3;  Surgeon: Gunnels Victory, MD;  Location: [facility] OR;  Service: Neurosurgery;  Laterality: N/A;  3C   POSTERIOR CERVICAL FUSION/FORAMINOTOMY N/A 08/07/2020   Procedure: C3-6 POSTERIOR FUSION WITH DECOMPRESSION;  Surgeon: Clois Fret, MD;  Location: [facility] ORS;  Service: Neurosurgery;  Laterality: N/A;   PROSTATECTOMY  04/2013   [facility] Dr ozell Geralds    TEE WITHOUT CARDIOVERSION N/A 02/22/2024   Procedure: ECHOCARDIOGRAM, TRANSESOPHAGEAL;  Surgeon: Alluri, Keller BROCKS, MD;  Location: [facility] ORS;  Service: Cardiovascular;  Laterality: N/A;    Family History  Problem Relation Age of Onset   Hypertension Mother    Stroke Mother    Stroke Father     Allergies  Allergen Reactions   Azithromycin Other (See Comments)    Avoid due to myasthenia gravis   Codeine Nausea And Vomiting       Latest Ref Rng & Units 03/20/2024   10:51 PM 03/06/2024    3:55 AM 03/05/2024    4:05 AM  CBC  WBC 4.0 - 10.5 K/uL 7.7  6.1  6.0   Hemoglobin 13.0 - 17.0 g/dL 88.9  88.5  89.0   Hematocrit 39.0 - 52.0 % 34.1  35.6  34.6  Platelets 150 - 400 K/uL 184  274  278       CMP     Component Value Date/Time   NA 138 03/20/2024 2251   NA 141 03/04/2014 0450   K 4.3 03/20/2024 2251   K 3.9 03/04/2014 0450   CL 103 03/20/2024 2251   CL 104 03/04/2014 0450   CO2 27 03/20/2024 2251   CO2 28 03/04/2014  0450   GLUCOSE 102 (H) 03/20/2024 2251   GLUCOSE 100 (H) 03/04/2014 0450   BUN 33 (H) 03/20/2024 2251   BUN 25 (H) 03/04/2014 0450   CREATININE 1.27 (H) 03/20/2024 2251   CREATININE 0.99 03/04/2014 0450   CALCIUM  8.7 (L) 03/20/2024 2251   CALCIUM  9.0 03/04/2014 0450   PROT 6.7 05/29/2023 1822   ALBUMIN  3.9 05/29/2023 1822   AST 13 (L) 05/29/2023 1822   ALT 6 05/29/2023 1822   ALKPHOS 54 05/29/2023 1822   BILITOT 0.6 05/29/2023 1822   GFRNONAA 57 (L) 03/20/2024 2251   GFRNONAA >60 03/04/2014 0450     No results found.     Assessment & Plan:   1. Lymphedema (Primary) Patient has an extensive medical history which can all be contributing to his bilateral lower extremity edema.  Most prominent this could be weakness from lumbar surgery function of the left hip virtually paralyzed needing wheelchair.  He is inability to walk or ambulate even short distances can contribute to deep vein thrombosis and reflux.  Recommend:  I have had a long discussion with the patient regarding swelling and why it  causes symptoms.  Patient will begin wearing graduated compression on a daily basis a prescription was given. The patient will  wear the stockings first thing in the morning and removing them in the evening. The patient is instructed specifically not to sleep in the stockings.   In addition, behavioral modification will be initiated.  This will include frequent elevation, use of over the counter pain medications and exercise such as walking.  Unfortunately due to the patient's history of a CVA and paralyzation from lumbar surgery exercise is going to be very difficult for him to do.  I encouraged him to try to do as much as he can.  Consideration for a lymph pump will also be made based upon the effectiveness of conservative therapy.  This would help to improve the edema control and prevent sequela such as ulcers and infections   Patient should undergo duplex ultrasound of the venous system to  ensure that DVT or reflux is not present.  The patient will follow-up with me after the ultrasound.   2. Essential hypertension, benign Continue antihypertensive medications as already ordered, these medications have been reviewed and there are no changes at this time.  3. Pure hypercholesterolemia Continue statin as ordered and reviewed, no changes at this time  4. Paroxysmal atrial fibrillation (HCC) Continue antiarrhythmia medications as already ordered, these medications have been reviewed and there are no changes at this time.  Continue anticoagulation as ordered by Cardiology Service   Current Outpatient Medications on File Prior to Visit  Medication Sig Dispense Refill   amiodarone  (PACERONE ) 200 MG tablet Take 2 tablets (400 mg total) by mouth 2 (two) times daily for 3 days, THEN 1 tablet (200 mg total) daily.     apixaban  (ELIQUIS ) 5 MG TABS tablet Take 5 mg by mouth 2 (two) times daily.     azaTHIOprine  (IMURAN ) 50 MG tablet Take 150 mg by mouth daily.  bisacodyl  (DULCOLAX) 10 MG suppository Place 1 suppository (10 mg total) rectally daily as needed for severe constipation.     bisacodyl  (DULCOLAX) 5 MG EC tablet Take 2 tablets (10 mg total) by mouth at bedtime as needed for moderate constipation.     carbidopa -levodopa  (SINEMET  IR) 25-100 MG tablet Take 1 tablet by mouth 3 (three) times daily.     Cholecalciferol  (D3-1000) 25 MCG (1000 UT) capsule Take 2,000 Units by mouth daily.     cyanocobalamin  (VITAMIN B12) 1000 MCG tablet Take 1,000 mcg by mouth daily.     dapagliflozin  propanediol (FARXIGA ) 10 MG TABS tablet Take 1 tablet (10 mg total) by mouth daily.     HYDROcodone -acetaminophen  (NORCO) 10-325 MG tablet Take 1-2 tablets by mouth every 6 (six) hours as needed for moderate pain (pain score 4-6) or severe pain (pain score 7-10). 10 tablet 0   levothyroxine  (SYNTHROID ) 50 MCG tablet Take 1 tablet (50 mcg total) by mouth daily at 6 (six) AM. 30 tablet 0   meclizine   (ANTIVERT ) 25 MG tablet Take 1 tablet by mouth 3 (three) times daily as needed for dizziness.     melatonin 3 MG TABS tablet Take 1 tablet (3 mg total) by mouth at bedtime.     polyethylene glycol (MIRALAX  / GLYCOLAX ) 17 g packet Take 17 g by mouth daily. Skip the dose if no constipation     predniSONE  (DELTASONE ) 10 MG tablet Take 10 mg by mouth every Monday, Wednesday, and Friday.     pregabalin  (LYRICA ) 50 MG capsule Take 50 mg by mouth 2 (two) times daily.     spironolactone  (ALDACTONE ) 25 MG tablet Take 1 tablet (25 mg total) by mouth daily.     torsemide  (DEMADEX ) 20 MG tablet Take 1 tablet (20 mg total) by mouth daily. If renal functions improve, check BMP before restarting     No current facility-administered medications on file prior to visit.    There are no Patient Instructions on file for this visit. No follow-ups on file.   Gwendlyn JONELLE Shank, NP

## 2024-05-17 ENCOUNTER — Emergency Department

## 2024-05-17 ENCOUNTER — Inpatient Hospital Stay
Admission: EM | Admit: 2024-05-17 | Discharge: 2024-05-22 | DRG: 091 | Disposition: A | Discharge status: Skilled Nursing Facility | Source: Skilled Nursing Facility | Attending: Internal Medicine | Admitting: Internal Medicine

## 2024-05-17 ENCOUNTER — Other Ambulatory Visit: Payer: Self-pay

## 2024-05-17 DIAGNOSIS — N179 Acute kidney failure, unspecified: Secondary | ICD-10-CM | POA: Diagnosis present

## 2024-05-17 DIAGNOSIS — E669 Obesity, unspecified: Secondary | ICD-10-CM | POA: Diagnosis present

## 2024-05-17 DIAGNOSIS — I5033 Acute on chronic diastolic (congestive) heart failure: Secondary | ICD-10-CM | POA: Diagnosis present

## 2024-05-17 DIAGNOSIS — Z9079 Acquired absence of other genital organ(s): Secondary | ICD-10-CM

## 2024-05-17 DIAGNOSIS — Z885 Allergy status to narcotic agent status: Secondary | ICD-10-CM

## 2024-05-17 DIAGNOSIS — I739 Peripheral vascular disease, unspecified: Secondary | ICD-10-CM | POA: Diagnosis present

## 2024-05-17 DIAGNOSIS — Z961 Presence of intraocular lens: Secondary | ICD-10-CM | POA: Diagnosis present

## 2024-05-17 DIAGNOSIS — I482 Chronic atrial fibrillation, unspecified: Secondary | ICD-10-CM | POA: Diagnosis present

## 2024-05-17 DIAGNOSIS — E7800 Pure hypercholesterolemia, unspecified: Secondary | ICD-10-CM | POA: Diagnosis present

## 2024-05-17 DIAGNOSIS — L03115 Cellulitis of right lower limb: Secondary | ICD-10-CM | POA: Diagnosis present

## 2024-05-17 DIAGNOSIS — I130 Hypertensive heart and chronic kidney disease with heart failure and stage 1 through stage 4 chronic kidney disease, or unspecified chronic kidney disease: Secondary | ICD-10-CM | POA: Diagnosis present

## 2024-05-17 DIAGNOSIS — N182 Chronic kidney disease, stage 2 (mild): Secondary | ICD-10-CM | POA: Diagnosis present

## 2024-05-17 DIAGNOSIS — G8929 Other chronic pain: Secondary | ICD-10-CM | POA: Diagnosis present

## 2024-05-17 DIAGNOSIS — E870 Hyperosmolality and hypernatremia: Secondary | ICD-10-CM | POA: Diagnosis present

## 2024-05-17 DIAGNOSIS — M25552 Pain in left hip: Secondary | ICD-10-CM | POA: Diagnosis present

## 2024-05-17 DIAGNOSIS — G7000 Myasthenia gravis without (acute) exacerbation: Secondary | ICD-10-CM | POA: Diagnosis present

## 2024-05-17 DIAGNOSIS — L03119 Cellulitis of unspecified part of limb: Secondary | ICD-10-CM | POA: Diagnosis present

## 2024-05-17 DIAGNOSIS — Z85821 Personal history of Merkel cell carcinoma: Secondary | ICD-10-CM

## 2024-05-17 DIAGNOSIS — E039 Hypothyroidism, unspecified: Secondary | ICD-10-CM | POA: Diagnosis present

## 2024-05-17 DIAGNOSIS — Z9841 Cataract extraction status, right eye: Secondary | ICD-10-CM

## 2024-05-17 DIAGNOSIS — M51369 Other intervertebral disc degeneration, lumbar region without mention of lumbar back pain or lower extremity pain: Secondary | ICD-10-CM | POA: Diagnosis present

## 2024-05-17 DIAGNOSIS — I480 Paroxysmal atrial fibrillation: Secondary | ICD-10-CM | POA: Diagnosis present

## 2024-05-17 DIAGNOSIS — I890 Lymphedema, not elsewhere classified: Secondary | ICD-10-CM | POA: Diagnosis present

## 2024-05-17 DIAGNOSIS — M79605 Pain in left leg: Secondary | ICD-10-CM

## 2024-05-17 DIAGNOSIS — J4489 Other specified chronic obstructive pulmonary disease: Secondary | ICD-10-CM | POA: Diagnosis present

## 2024-05-17 DIAGNOSIS — R296 Repeated falls: Secondary | ICD-10-CM | POA: Diagnosis present

## 2024-05-17 DIAGNOSIS — G20A1 Parkinson's disease without dyskinesia, without mention of fluctuations: Secondary | ICD-10-CM | POA: Diagnosis present

## 2024-05-17 DIAGNOSIS — Z79890 Hormone replacement therapy (postmenopausal): Secondary | ICD-10-CM

## 2024-05-17 DIAGNOSIS — Z6835 Body mass index (BMI) 35.0-35.9, adult: Secondary | ICD-10-CM

## 2024-05-17 DIAGNOSIS — Z7901 Long term (current) use of anticoagulants: Secondary | ICD-10-CM

## 2024-05-17 DIAGNOSIS — Z8546 Personal history of malignant neoplasm of prostate: Secondary | ICD-10-CM

## 2024-05-17 DIAGNOSIS — Z7952 Long term (current) use of systemic steroids: Secondary | ICD-10-CM

## 2024-05-17 DIAGNOSIS — M48062 Spinal stenosis, lumbar region with neurogenic claudication: Secondary | ICD-10-CM | POA: Diagnosis present

## 2024-05-17 DIAGNOSIS — Z981 Arthrodesis status: Secondary | ICD-10-CM

## 2024-05-17 DIAGNOSIS — M4807 Spinal stenosis, lumbosacral region: Secondary | ICD-10-CM | POA: Diagnosis present

## 2024-05-17 DIAGNOSIS — R600 Localized edema: Principal | ICD-10-CM

## 2024-05-17 DIAGNOSIS — Z881 Allergy status to other antibiotic agents status: Secondary | ICD-10-CM

## 2024-05-17 DIAGNOSIS — Z66 Do not resuscitate: Secondary | ICD-10-CM | POA: Diagnosis present

## 2024-05-17 DIAGNOSIS — Z9842 Cataract extraction status, left eye: Secondary | ICD-10-CM

## 2024-05-17 DIAGNOSIS — I509 Heart failure, unspecified: Secondary | ICD-10-CM

## 2024-05-17 DIAGNOSIS — L03116 Cellulitis of left lower limb: Secondary | ICD-10-CM | POA: Diagnosis present

## 2024-05-17 DIAGNOSIS — M5030 Other cervical disc degeneration, unspecified cervical region: Secondary | ICD-10-CM | POA: Diagnosis present

## 2024-05-17 DIAGNOSIS — Z79899 Other long term (current) drug therapy: Secondary | ICD-10-CM

## 2024-05-17 DIAGNOSIS — Z87891 Personal history of nicotine dependence: Secondary | ICD-10-CM

## 2024-05-17 DIAGNOSIS — Z823 Family history of stroke: Secondary | ICD-10-CM

## 2024-05-17 DIAGNOSIS — M7989 Other specified soft tissue disorders: Secondary | ICD-10-CM | POA: Diagnosis present

## 2024-05-17 DIAGNOSIS — Z85828 Personal history of other malignant neoplasm of skin: Secondary | ICD-10-CM

## 2024-05-17 DIAGNOSIS — Z8249 Family history of ischemic heart disease and other diseases of the circulatory system: Secondary | ICD-10-CM

## 2024-05-17 DIAGNOSIS — Z8673 Personal history of transient ischemic attack (TIA), and cerebral infarction without residual deficits: Secondary | ICD-10-CM

## 2024-05-17 LAB — COMPREHENSIVE METABOLIC PANEL WITH GFR
ALT: 7 U/L (ref 0–44)
AST: 16 U/L (ref 15–41)
Albumin: 3.6 g/dL (ref 3.5–5.0)
Alkaline Phosphatase: 52 U/L (ref 38–126)
Anion gap: 15 (ref 5–15)
BUN: 47 mg/dL — ABNORMAL HIGH (ref 8–23)
CO2: 32 mmol/L (ref 22–32)
Calcium: 9.1 mg/dL (ref 8.9–10.3)
Chloride: 99 mmol/L (ref 98–111)
Creatinine, Ser: 1.99 mg/dL — ABNORMAL HIGH (ref 0.61–1.24)
GFR, Estimated: 34 mL/min — ABNORMAL LOW
Glucose, Bld: 91 mg/dL (ref 70–99)
Potassium: 4 mmol/L (ref 3.5–5.1)
Sodium: 146 mmol/L — ABNORMAL HIGH (ref 135–145)
Total Bilirubin: 1 mg/dL (ref 0.0–1.2)
Total Protein: 6.5 g/dL (ref 6.5–8.1)

## 2024-05-17 LAB — CBC WITH DIFFERENTIAL/PLATELET
Abs Immature Granulocytes: 0.06 10*3/uL (ref 0.00–0.07)
Basophils Absolute: 0 10*3/uL (ref 0.0–0.1)
Basophils Relative: 0 %
Eosinophils Absolute: 0.1 10*3/uL (ref 0.0–0.5)
Eosinophils Relative: 2 %
HCT: 30.4 % — ABNORMAL LOW (ref 39.0–52.0)
Hemoglobin: 9.4 g/dL — ABNORMAL LOW (ref 13.0–17.0)
Immature Granulocytes: 1 %
Lymphocytes Relative: 11 %
Lymphs Abs: 0.7 10*3/uL (ref 0.7–4.0)
MCH: 34.9 pg — ABNORMAL HIGH (ref 26.0–34.0)
MCHC: 30.9 g/dL (ref 30.0–36.0)
MCV: 113 fL — ABNORMAL HIGH (ref 80.0–100.0)
Monocytes Absolute: 1 10*3/uL (ref 0.1–1.0)
Monocytes Relative: 14 %
Neutro Abs: 4.9 10*3/uL (ref 1.7–7.7)
Neutrophils Relative %: 72 %
Platelets: 237 10*3/uL (ref 150–400)
RBC: 2.69 MIL/uL — ABNORMAL LOW (ref 4.22–5.81)
RDW: 16.3 % — ABNORMAL HIGH (ref 11.5–15.5)
Smear Review: NORMAL
WBC: 6.8 10*3/uL (ref 4.0–10.5)
nRBC: 0.3 % — ABNORMAL HIGH (ref 0.0–0.2)

## 2024-05-17 LAB — BRAIN NATRIURETIC PEPTIDE: B Natriuretic Peptide: 724.8 pg/mL — ABNORMAL HIGH (ref 0.0–100.0)

## 2024-05-17 LAB — MAGNESIUM: Magnesium: 2.2 mg/dL (ref 1.7–2.4)

## 2024-05-17 MED ORDER — HYDROMORPHONE HCL 1 MG/ML IJ SOLN
0.5000 mg | Freq: Once | INTRAMUSCULAR | Status: AC
  Administered 2024-05-17: 0.5 mg via INTRAVENOUS
  Filled 2024-05-17: qty 0.5

## 2024-05-17 MED ORDER — METOPROLOL TARTRATE 25 MG PO TABS
25.0000 mg | ORAL_TABLET | Freq: Two times a day (BID) | ORAL | Status: DC
  Administered 2024-05-17: 25 mg via ORAL
  Filled 2024-05-17: qty 1

## 2024-05-17 MED ORDER — FUROSEMIDE 10 MG/ML IJ SOLN
40.0000 mg | Freq: Every day | INTRAMUSCULAR | Status: DC
  Administered 2024-05-18 – 2024-05-19 (×2): 40 mg via INTRAVENOUS
  Filled 2024-05-17 (×2): qty 4

## 2024-05-17 MED ORDER — POTASSIUM CHLORIDE CRYS ER 10 MEQ PO TBCR
10.0000 meq | EXTENDED_RELEASE_TABLET | Freq: Every day | ORAL | Status: DC
  Administered 2024-05-18 – 2024-05-22 (×5): 10 meq via ORAL
  Filled 2024-05-17 (×5): qty 1

## 2024-05-17 MED ORDER — CARBIDOPA-LEVODOPA 25-100 MG PO TABS
1.0000 | ORAL_TABLET | Freq: Three times a day (TID) | ORAL | Status: DC
  Administered 2024-05-17 – 2024-05-22 (×15): 1 via ORAL
  Filled 2024-05-17 (×17): qty 1

## 2024-05-17 MED ORDER — PREGABALIN 50 MG PO CAPS
50.0000 mg | ORAL_CAPSULE | Freq: Two times a day (BID) | ORAL | Status: DC
  Administered 2024-05-17 – 2024-05-18 (×2): 50 mg via ORAL
  Filled 2024-05-17 (×2): qty 1

## 2024-05-17 MED ORDER — AMIODARONE HCL 200 MG PO TABS
200.0000 mg | ORAL_TABLET | Freq: Every day | ORAL | Status: DC
  Administered 2024-05-18 – 2024-05-22 (×5): 200 mg via ORAL
  Filled 2024-05-17 (×5): qty 1

## 2024-05-17 MED ORDER — FUROSEMIDE 10 MG/ML IJ SOLN
60.0000 mg | Freq: Once | INTRAMUSCULAR | Status: AC
  Administered 2024-05-17: 60 mg via INTRAVENOUS
  Filled 2024-05-17: qty 8

## 2024-05-17 MED ORDER — BISACODYL 10 MG RE SUPP: 10.0000 mg | Freq: Every day | RECTAL | Status: DC | PRN

## 2024-05-17 MED ORDER — PREDNISONE 10 MG PO TABS: 10.0000 mg | ORAL_TABLET | ORAL | Status: DC

## 2024-05-17 MED ORDER — METHOCARBAMOL 1000 MG/10ML IJ SOLN
1000.0000 mg | Freq: Three times a day (TID) | INTRAMUSCULAR | Status: DC
  Administered 2024-05-18 – 2024-05-21 (×9): 1000 mg via INTRAVENOUS
  Filled 2024-05-17 (×14): qty 10

## 2024-05-17 MED ORDER — LEVOTHYROXINE SODIUM 50 MCG PO TABS
50.0000 ug | ORAL_TABLET | Freq: Every day | ORAL | Status: DC
  Administered 2024-05-18 – 2024-05-22 (×5): 50 ug via ORAL
  Filled 2024-05-17 (×5): qty 1

## 2024-05-17 MED ORDER — ALBUTEROL SULFATE (2.5 MG/3ML) 0.083% IN NEBU: 2.5000 mg | INHALATION_SOLUTION | RESPIRATORY_TRACT | Status: DC | PRN

## 2024-05-17 MED ORDER — HYDRALAZINE HCL 20 MG/ML IJ SOLN: 10.0000 mg | Freq: Four times a day (QID) | INTRAMUSCULAR | Status: DC | PRN

## 2024-05-17 MED ORDER — BISACODYL 5 MG PO TBEC
10.0000 mg | DELAYED_RELEASE_TABLET | Freq: Every evening | ORAL | Status: DC | PRN
  Administered 2024-05-19: 10 mg via ORAL
  Filled 2024-05-17: qty 2

## 2024-05-17 MED ORDER — FENTANYL CITRATE PF 50 MCG/ML IJ SOSY
50.0000 ug | PREFILLED_SYRINGE | Freq: Once | INTRAMUSCULAR | Status: AC
  Administered 2024-05-17: 50 ug via INTRAVENOUS
  Filled 2024-05-17: qty 1

## 2024-05-17 MED ORDER — VITAMIN D3 25 MCG (1000 UNIT) PO TABS
2000.0000 [IU] | ORAL_TABLET | Freq: Every day | ORAL | Status: DC
  Administered 2024-05-18 – 2024-05-22 (×5): 2000 [IU] via ORAL
  Filled 2024-05-17 (×11): qty 2

## 2024-05-17 MED ORDER — SPIRONOLACTONE 25 MG PO TABS
25.0000 mg | ORAL_TABLET | Freq: Every day | ORAL | Status: DC
  Administered 2024-05-18 – 2024-05-22 (×5): 25 mg via ORAL
  Filled 2024-05-17 (×5): qty 1

## 2024-05-17 MED ORDER — CEFAZOLIN SODIUM-DEXTROSE 2-4 GM/100ML-% IV SOLN
2.0000 g | Freq: Three times a day (TID) | INTRAVENOUS | Status: DC
  Administered 2024-05-17 – 2024-05-20 (×8): 2 g via INTRAVENOUS
  Filled 2024-05-17 (×10): qty 100

## 2024-05-17 MED ORDER — ACETAMINOPHEN 325 MG PO TABS: 650.0000 mg | ORAL_TABLET | Freq: Four times a day (QID) | ORAL | Status: DC | PRN

## 2024-05-17 MED ORDER — ONDANSETRON HCL 4 MG/2ML IJ SOLN: 4.0000 mg | Freq: Four times a day (QID) | INTRAMUSCULAR | Status: DC | PRN

## 2024-05-17 MED ORDER — AZATHIOPRINE 50 MG PO TABS
150.0000 mg | ORAL_TABLET | Freq: Every day | ORAL | Status: DC
  Administered 2024-05-18 – 2024-05-22 (×5): 150 mg via ORAL
  Filled 2024-05-17 (×7): qty 3

## 2024-05-17 MED ORDER — LIDOCAINE 5 % EX PTCH
1.0000 | MEDICATED_PATCH | CUTANEOUS | Status: DC
  Administered 2024-05-18 – 2024-05-22 (×5): 1 via TRANSDERMAL
  Filled 2024-05-17 (×5): qty 1

## 2024-05-17 MED ORDER — VITAMIN B-12 1000 MCG PO TABS
1000.0000 ug | ORAL_TABLET | Freq: Every day | ORAL | Status: DC
  Administered 2024-05-18 – 2024-05-22 (×5): 1000 ug via ORAL
  Filled 2024-05-17 (×5): qty 1

## 2024-05-17 MED ORDER — APIXABAN 5 MG PO TABS
5.0000 mg | ORAL_TABLET | Freq: Two times a day (BID) | ORAL | Status: DC
  Administered 2024-05-17 – 2024-05-22 (×10): 5 mg via ORAL
  Filled 2024-05-17 (×10): qty 1

## 2024-05-17 MED ORDER — ACETAMINOPHEN 650 MG RE SUPP: 650.0000 mg | Freq: Four times a day (QID) | RECTAL | Status: DC | PRN

## 2024-05-17 MED ORDER — MECLIZINE HCL 25 MG PO TABS: 25.0000 mg | ORAL_TABLET | Freq: Three times a day (TID) | ORAL | Status: DC | PRN

## 2024-05-17 MED ORDER — OXYCODONE HCL 5 MG PO TABS
5.0000 mg | ORAL_TABLET | ORAL | Status: DC | PRN
  Administered 2024-05-18: 10 mg via ORAL
  Filled 2024-05-17: qty 2

## 2024-05-17 MED ORDER — ONDANSETRON HCL 4 MG PO TABS: 4.0000 mg | ORAL_TABLET | Freq: Four times a day (QID) | ORAL | Status: DC | PRN

## 2024-05-17 MED ORDER — HYDROMORPHONE HCL 1 MG/ML IJ SOLN
0.5000 mg | INTRAMUSCULAR | Status: DC | PRN
  Administered 2024-05-17: 1 mg via INTRAVENOUS
  Filled 2024-05-17: qty 1

## 2024-05-17 NOTE — H&P (Signed)
 History and Physical    Francisco [name] FMW:[id number] DOB: Feb 09, 1946 DOA: 05/17/2024  PCP: Fernande Ophelia JINNY DOUGLAS, MD Patient coming from: Home  I have personally briefly reviewed patient's old medical records in [facility]  Chief Complaint: Frequent falls  HPI: Francisco [name] is a 78 y.o. male with medical history significant of SVT, chronic atrial fibrillation, chronic anticoagulation Eliquis , Parkinson disease on Sinemet , hypertension, hyperlipidemia, COPD, peripheral vascular disease, diastolic congestive heart failure, history of CVA, hypothyroidism, bilateral carotid artery stenosis, myasthenia gravis who presents with frequent falls.  The patient states that he has had no recent changes in medication.  He has noted his bilateral lower extremities to be increasingly swollen.  His dose of torsemide  was just increased by PCP.  Patient presented to the emergency room after 4 falls in the 24 hours and resultant severe pain in the left hip.  Patient endorses ongoing or progressive issues with redness and edema lower extremities associated with pain.  Has been progressively worsening.  Patient is anticoagulant on Eliquis .  Reports that his legs gave out on him.  Does not report loss of consciousness.  Does not report head trauma.  Imaging survey and emergency room overall reassuring.  No evidence of bleed.  No evidence of fracture on imaging of the hip.  Laboratory vesication significant for AKI on CKD.  On my evaluation patient is resting in bed.  Son at bedside.  Patient appears visibly in pain.  He does answer all questions appropriately.  Vital signs reassuring at time of my evaluation.  ED Course: Patient was given multiple doses of pain medication, 60 mg of IV Lasix .  Hospitalist contacted for admission.  Review of Systems: As per HPI otherwise 14 point review of systems negative.    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   Carotid stenosis    CHF (congestive heart failure) (HCC)    CKD (chronic kidney disease)    CKD (chronic kidney disease) stage 3, GFR 30-59 ml/min (HCC)    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   DDD (degenerative disc disease), cervical    Dysrhythmia    post carotid stent bradycardia; PAF 09/2020   GERD (gastroesophageal reflux disease)    Hypercholesterolemia    Hypertension    Hypothyroidism    pt takes Levothyroxine  daily   Lumbosacral spinal stenosis    Myasthenia gravis, adult form (HCC)    PAD (peripheral artery disease) (HCC)    Parkinson disease (HCC)    Shortness of breath    Lung MD- Dr Burnett Servant   Sleep apnea    do not use CPAP every night   Stroke (HCC) 05/31/2023    Past Surgical History:  Procedure Laterality Date   ANTERIOR CERVICAL DECOMP/DISCECTOMY FUSION  07/18/2011   Procedure: ANTERIOR CERVICAL DECOMPRESSION/DISCECTOMY FUSION 2 LEVELS;  Surgeon: Victory LABOR Pool;  Location: [facility];  Service: Neurosurgery;  Laterality: N/A;  cervical five-six, cervical six-seven anterior cervical discectomy and fusion   ATRIAL FIBRILLATION ABLATION     BACK SURGERY     in 1985 [facility]   BILATERAL CARPAL TUNNEL RELEASE  01/2020 Right, 04/2020 Left   CARDIAC CATHETERIZATION     2005 at [facility], no stents   CARDIOVERSION N/A 02/22/2024   Procedure: CARDIOVERSION;  Surgeon: Alluri, Keller BROCKS, MD;  Location: [facility] ORS;  Service: Cardiovascular;  Laterality: N/A;   CAROTID PTA/STENT INTERVENTION N/A 09/17/2020   Procedure: CAROTID PTA/STENT INTERVENTION;  Surgeon: Marea Selinda RAMAN, MD;  Location: [facility] IN[facility]AS[facility] CV LAB;  Service: Cardiovascular;  Laterality: N/A;   CATARACT EXTRACTION W/PHACO Left 01/06/2020   Procedure:  CATARACT EXTRACTION PHACO AND INTRAOCULAR LENS PLACEMENT (IOC) ISTENT INJ LEFT 3.81  00:33.3;  Surgeon: Myrna Adine Anes, MD;  Location: [facility];  Service: Ophthalmology;  Laterality: Left;   CATARACT EXTRACTION W/PHACO Right 02/03/2020   Procedure: CATARACT EXTRACTION PHACO AND INTRAOCULAR LENS PLACEMENT (IOC) RIGHT ISTENT INJ;  Surgeon: Myrna Adine Anes, MD;  Location: [facility];  Service: Ophthalmology;  Laterality: Right;  4.29 0:35.6   COLONOSCOPY     HERNIA REPAIR Left    inguinal hernia repair in 1985   LUMBAR LAMINECTOMY/DECOMPRESSION MICRODISCECTOMY Left 02/24/2014   Procedure: LUMBAR LAMINECTOMY/DECOMPRESSION MICRODISCECTOMY LUMBAR THREE-FOUR, FOUR-FIVE, LEFT FIVE-SACRAL ONE ;  Surgeon: Victory DELENA Gunnels, MD;  Location: [facility];  Service: Neurosurgery;  Laterality: Left;  LUMBAR LAMINECTOMY/DECOMPRESSION MICRODISCECTOMY LUMBAR THREE-FOUR, FOUR-FIVE, LEFT FIVE-SACRAL ONE    LUMBAR LAMINECTOMY/DECOMPRESSION MICRODISCECTOMY N/A 05/03/2021   Procedure: Laminectomy and Foraminotomy - L2-L3;  Surgeon: Gunnels Victory, MD;  Location: [facility];  Service: Neurosurgery;  Laterality: N/A;  3C   POSTERIOR CERVICAL FUSION/FORAMINOTOMY N/A 08/07/2020   Procedure: C3-6 POSTERIOR FUSION WITH DECOMPRESSION;  Surgeon: Clois Fret, MD;  Location: [facility] ORS;  Service: Neurosurgery;  Laterality: N/A;   PROSTATECTOMY  04/2013   [facility] Dr ozell Geralds    TEE WITHOUT CARDIOVERSION N/A 02/22/2024   Procedure: ECHOCARDIOGRAM, TRANSESOPHAGEAL;  Surgeon: Alluri, Keller BROCKS, MD;  Location: [facility] ORS;  Service: Cardiovascular;  Laterality: N/A;     reports that he quit smoking about 22 years ago. His smoking use included cigarettes. He started smoking about 42 years ago. He has a 20 pack-year smoking history. He has never used smokeless tobacco. He reports current alcohol use of about 3.0 standard drinks of alcohol per week. He reports that he does not use drugs.  Allergies  Allergen Reactions    Azithromycin Other (See Comments)    Avoid due to myasthenia gravis   Codeine Nausea And Vomiting    Family History  Problem Relation Age of Onset   Hypertension Mother    Stroke Mother    Stroke Father      Prior to Admission medications   Medication Sig Start Date End Date Taking? Authorizing Provider  bumetanide (BUMEX) 1 MG tablet Take 1 mg by mouth daily. 05/03/24  Yes [provider]  metoprolol  tartrate (LOPRESSOR ) 25 MG tablet Take 25 mg by mouth 2 (two) times daily. 05/03/24  Yes [provider]  nystatin cream (MYCOSTATIN) Apply 1 Application topically 2 (two) times daily. 04/11/24 04/11/25 Yes [provider]  potassium chloride  (KLOR-CON  M) 10 MEQ tablet Take 10 mEq by mouth daily. 03/25/24  Yes [provider]  amiodarone  (PACERONE ) 200 MG tablet Take 2 tablets (400 mg total) by mouth 2 (two) times daily for 3 days, THEN 1 tablet (200 mg total) daily. 03/06/24 03/09/25  Von Bellis, MD  apixaban  (ELIQUIS ) 5 MG TABS tablet Take 5 mg by mouth 2 (two) times daily.    [provider]  azaTHIOprine  (IMURAN ) 50 MG tablet Take 150 mg by mouth  daily.     [provider]  bisacodyl  (DULCOLAX) 10 MG suppository Place 1 suppository (10 mg total) rectally daily as needed for severe constipation. 03/06/24   Von Bellis, MD  bisacodyl  (DULCOLAX) 5 MG EC tablet Take 2 tablets (10 mg total) by mouth at bedtime as needed for moderate constipation. 03/06/24   Von Bellis, MD  bumetanide (BUMEX) 2 MG tablet Take 2 mg by mouth daily.    [provider]  carbidopa -levodopa  (SINEMET  IR) 25-100 MG tablet Take 1 tablet by mouth 3 (three) times daily. 11/11/21   [provider]  Cholecalciferol  (D3-1000) 25 MCG (1000 UT) capsule Take 2,000 Units by mouth daily.    [provider]  cyanocobalamin  (VITAMIN B12) 1000 MCG tablet Take 1,000 mcg by mouth daily.    [provider]  dapagliflozin  propanediol (FARXIGA ) 10  MG TABS tablet Take 1 tablet (10 mg total) by mouth daily. 03/07/24 03/07/25  Von Bellis, MD  HYDROcodone -acetaminophen  (NORCO) 10-325 MG tablet Take 1-2 tablets by mouth every 6 (six) hours as needed for moderate pain (pain score 4-6) or severe pain (pain score 7-10). 03/06/24   Von Bellis, MD  levothyroxine  (SYNTHROID ) 50 MCG tablet Take 1 tablet (50 mcg total) by mouth daily at 6 (six) AM. 01/01/22 07/31/24  Trudy Anthony HERO, MD  meclizine  (ANTIVERT ) 25 MG tablet Take 1 tablet by mouth 3 (three) times daily as needed for dizziness. 07/31/23   [provider]  melatonin 3 MG TABS tablet Take 1 tablet (3 mg total) by mouth at bedtime. 03/06/24   Von Bellis, MD  polyethylene glycol (MIRALAX  / GLYCOLAX ) 17 g packet Take 17 g by mouth daily. Skip the dose if no constipation 03/06/24   Von Bellis, MD  predniSONE  (DELTASONE ) 10 MG tablet Take 10 mg by mouth every Monday, Wednesday, and Friday.    [provider]  pregabalin  (LYRICA ) 50 MG capsule Take 50 mg by mouth 2 (two) times daily.    [provider]  spironolactone  (ALDACTONE ) 25 MG tablet Take 1 tablet (25 mg total) by mouth daily. 03/07/24 03/07/25  Von Bellis, MD  torsemide  (DEMADEX ) 20 MG tablet Take 1 tablet (20 mg total) by mouth daily. If renal functions improve, check BMP before restarting 03/11/24   Von Bellis, MD    Physical Exam: Vitals:   05/17/24 0955 05/17/24 1230 05/17/24 1430 05/17/24 1453  BP: 139/67 138/68 131/74   Pulse: 61 (!) 56 (!) 55   Resp: (!) 22 17 16    Temp: 97.8 F (36.6 C)   98 F (36.7 C)  SpO2: 97% 100% 92%   Weight:      Height:        General: Appears uncomfortable HEENT: Normocephalic, atraumatic Neck, supple, trachea midline, no tenderness Heart: Regular rate, irregular rhythm, no murmurs, 2+ pitting edema BLE Lungs: Crackles.  Normal work of breathing.  Room air Abdomen: Obese, soft, NT/ND, normal bowel sounds Extremities: Normal, atraumatic, no clubbing or  cyanosis, normal muscle tone Skin: Bilateral lower extremities erythematous, tender to touch Neurologic: Cranial nerves grossly intact, sensation intact, alert and oriented x3 Psychiatric: Normal affect  Labs on Admission: I have personally reviewed following labs and imaging studies  CBC: Recent Labs  Lab 05/17/24 0951  WBC 6.8  NEUTROABS 4.9  HGB 9.4*  HCT 30.4*  MCV 113.0*  PLT 237   Basic Metabolic Panel: Recent Labs  Lab 05/17/24 0951  NA 146*  K 4.0  CL 99  CO2 32  GLUCOSE 91  BUN 47*  CREATININE 1.99*  CALCIUM  9.1  MG 2.2   GFR: Estimated Creatinine Clearance: 38 mL/min (A) (by C-G formula based on SCr of 1.99 mg/dL (H)). Liver Function Tests: Recent Labs  Lab 05/17/24 0951  AST 16  ALT 7  ALKPHOS 52  BILITOT 1.0  PROT 6.5  ALBUMIN  3.6   No results for input(s): LIPASE, AMYLASE in the last 168 hours. No results for input(s): AMMONIA in the last 168 hours. Coagulation Profile: No results for input(s): INR, PROTIME in the last 168 hours. Cardiac Enzymes: No results for input(s): CKTOTAL, CKMB, CKMBINDEX, TROPONINI in the last 168 hours. BNP (last 3 results) No results for input(s): PROBNP in the last 8760 hours. HbA1C: No results for input(s): HGBA1C in the last 72 hours. CBG: No results for input(s): GLUCAP in the last 168 hours. Lipid Profile: No results for input(s): CHOL, HDL, LDLCALC, TRIG, CHOLHDL, LDLDIRECT in the last 72 hours. Thyroid  Function Tests: No results for input(s): TSH, T4TOTAL, FREET4, T3FREE, THYROIDAB in the last 72 hours. Anemia Panel: No results for input(s): VITAMINB12, FOLATE, FERRITIN, TIBC, IRON , RETICCTPCT in the last 72 hours. Urine analysis:    Component Value Date/Time   COLORURINE YELLOW (A) 05/13/2023 1409   APPEARANCEUR CLEAR (A) 05/13/2023 1409   LABSPEC 1.025 05/13/2023 1409   PHURINE 5.5 05/13/2023 1409   GLUCOSEU NEGATIVE 05/13/2023 1409   HGBUR  NEGATIVE 05/13/2023 1409   BILIRUBINUR NEGATIVE 05/13/2023 1409   KETONESUR 15 (A) 05/13/2023 1409   PROTEINUR NEGATIVE 05/13/2023 1409   NITRITE NEGATIVE 05/13/2023 1409   LEUKOCYTESUR NEGATIVE 05/13/2023 1409    Radiological Exams on Admission: US  Venous Img Lower Bilateral Result Date: 05/17/2024 CLINICAL DATA:  Bilateral lower extremity pain and swelling, left side greater than right. Varicose veins. Prostate carcinoma and melanoma. EXAM: BILATERAL LOWER EXTREMITY VENOUS DOPPLER ULTRASOUND TECHNIQUE: Gray-scale sonography with compression, as well as color and duplex ultrasound, were performed to evaluate the deep venous system(s) from the level of the common femoral vein through the popliteal and proximal calf veins. COMPARISON:  02/27/2024 FINDINGS: VENOUS Normal compressibility of the common femoral, superficial femoral, and popliteal veins, as well as the visualized calf veins throughout both lower extremities. Visualized portions of profunda femoral vein and great saphenous vein unremarkable in both lower extremities. No filling defects to suggest DVT on grayscale or color Doppler imaging. Doppler waveforms show normal direction of venous flow, normal respiratory plasticity and response to augmentation in both lower extremities. A OTHER None. Limitations: none IMPRESSION: No evidence of DVT in either lower extremity. Electronically Signed   By: Norleen DELENA Kil M.D.   On: 05/17/2024 12:50   DG Hip Unilat W or Wo Pelvis 2-3 Views Left Result Date: 05/17/2024 EXAM: 2 or more VIEW(S) XRAY OF THE LEFT HIP 05/17/2024 11:40:12 AM COMPARISON: None available. CLINICAL HISTORY: Fall, leg pain. Leg swelling. FINDINGS: BONES AND JOINTS: No acute fracture or focal osseous lesion. The hip joint is maintained. Mild degenerative changes of left hip. Degenerative changes of visualized lower lumbar spine. SOFT TISSUES: The soft tissues are unremarkable. Surgical clips overlying pelvis. Vascular calcifications.  IMPRESSION: 1. No acute fracture or dislocation of the left hip or visualized pelvis. 2. Mild degenerative changes of the left hip. Electronically signed by: Donnice Mania MD 05/17/2024 11:52 AM EDT RP Workstation: [id number]   CT Cervical Spine Wo Contrast Result Date: 05/17/2024 EXAM: CT CERVICAL SPINE WITHOUT CONTRAST 05/17/2024 11:28:11 AM TECHNIQUE: CT of the cervical spine was performed without the administration of intravenous contrast.  Multiplanar reformatted images are provided for review. Automated exposure control, iterative reconstruction, and/or weight based adjustment of the mA/kV was utilized to reduce the radiation dose to as low as reasonably achievable. COMPARISON: CT of the cervical spine dated 12/05/2000. CLINICAL HISTORY: Polytrauma, blunt. fallen x4 in 2 days. +blood thinners. Bruising on chest FINDINGS: CERVICAL SPINE: BONES AND ALIGNMENT: The patient is again noted to be status post bilateral laminectomies and bilateral posterolateral spinal fusion of C3 through C5. There is loosening of the orthopedic screws at C3 again demonstrated bilaterally. The patient is also again noted to be status post ACDF (anterior cervical discectomy and fusion) of C5 through C7, with completed fusion. There is no definite evidence of acute traumatic injury. The study is degraded by patient motion. DEGENERATIVE CHANGES: No significant degenerative changes. SOFT TISSUES: No prevertebral soft tissue swelling. VASCULATURE: A vascular stent is noted within the right common carotid artery. IMPRESSION: 1. No definite evidence of acute traumatic injury. 2. Status post bilateral laminectomies and bilateral posterolateral spinal fusion of C3 through C5 with loosening of the orthopedic screws at C3 bilaterally. 3. Status post ACDF of C5 through C7 with completed fusion. 4. Study degraded by patient motion. Electronically signed by: Evalene Coho MD 05/17/2024 11:51 AM EDT RP Workstation: [id number]   CT Chest Wo  Contrast Result Date: 05/17/2024 EXAM: CT CHEST WITHOUT CONTRAST 05/17/2024 11:28:11 AM TECHNIQUE: CT of the chest was performed without the administration of intravenous contrast. Multiplanar reformatted images are provided for review. Automated exposure control, iterative reconstruction, and/or weight based adjustment of the mA/kV was utilized to reduce the radiation dose to as low as reasonably achievable. COMPARISON: CT of the chest dated 12/05/2000. CLINICAL HISTORY: Chest trauma, blunt; right chest wall hematoma. fallen x4 in 2 days. +blood thinners. Bruising on chest FINDINGS: MEDIASTINUM AND LYMPH NODES: The heart is mildly enlarged. There is moderate calcific coronary artery disease. There is moderate calcification within the thoracic aorta. The patient is status post ACDF of C5-6 and C6-7. No mediastinal, hilar or axillary lymphadenopathy. LUNGS AND PLEURA: There are streaky peribronchovascular opacities again demonstrated within the bases of the right middle and lower lobes which are likely chronic but could be recurrent. There is mild atelectasis present independently within the left lower lobe. No pleural effusion or pneumothorax. SOFT TISSUES/BONES: No acute abnormality of the bones or soft tissues. UPPER ABDOMEN: Limited images of the upper abdomen demonstrates no acute abnormality. IMPRESSION: 1. No acute findings. 2. Streaky peribronchovascular opacities in the bases of the right middle and lower lobes, likely chronic but could be recurrent. 3. Mild atelectasis in the left lower lobe. 4. Mildly enlarged heart with moderate calcific coronary artery disease and moderate calcification within the thoracic aorta. Electronically signed by: Evalene Coho MD 05/17/2024 11:46 AM EDT RP Workstation: [id number]   CT Head Wo Contrast Result Date: 05/17/2024 EXAM: CT HEAD WITHOUT CONTRAST 05/17/2024 11:28:11 AM TECHNIQUE: CT of the head was performed without the administration of intravenous contrast.  Automated exposure control, iterative reconstruction, and/or weight based adjustment of the mA/kV was utilized to reduce the radiation dose to as low as reasonably achievable. COMPARISON: CT angiogram of the head dated 05/31/2023. CLINICAL HISTORY: Polytrauma, blunt. fallen x4 in 2 days. +blood thinners. Bruising on chest. FINDINGS: BRAIN AND VENTRICLES: No acute hemorrhage. No evidence of acute infarct. No hydrocephalus. No extra-axial collection. No mass effect or midline shift. ORBITS: Status post bilateral lens replacement. No acute abnormality. SINUSES: Mild mucosal disease within the ethmoid and maxillary sinuses. No acute  abnormality. SOFT TISSUES AND SKULL: No acute soft tissue abnormality. No skull fracture. IMPRESSION: 1. No acute intracranial abnormality. 2. Mild mucosal disease within the ethmoid and maxillary sinuses. Electronically signed by: Evalene Coho MD 05/17/2024 11:32 AM EDT RP Workstation: [id number]    EKG: Independently reviewed.  Junctional rhythm  Assessment/Plan Principal Problem:   Frequent falls  Frequent falls Likely multifactorial in origin secondary to perhaps progressive Parkinson's, increasing lower extremity edema, pain in lower extremities, chronic DJD. Plan: Admit to inpatient Pain control Engage therapy once pain control achieved  Acute on chronic diastolic congestive heart failure Difficult to assess volume status given overall body habitus but patient does have edema on the lower extremities and elevated BNP higher than normal Plan: Lasix  40 mg daily (received 60 mg in ED) Continue home Aldactone  Continue home beta-blocker Strict ins and outs, daily weights Fluid restricted diet Target net -1-1.5 L daily  Bilateral lower extremity edema Superimposed cellulitis Chronic lymphedema Etiology of bilateral lower extremity edema is somewhat unclear.  Legs are erythematous and tender to touch which speaks more towards a cellulitic process.  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  Again volume status difficult to ascertain.  Does not appear dry Diuresis as above Daily renal parameters  Paroxysmal atrial fibrillation History of SVT Heart rate currently controlled, currently junctional rhythm Plan: Continue home Lopressor  Continue home Eliquis   History of myasthenia gravis Continue home prednisone  10 mg Monday Wednesday Friday Continue home Imuran   Bilateral carotid artery stenosis Previously on Plavix  this appears to have been discontinued Continue home Eliquis  High intensity statin  Hypothyroidism Synthroid   Parkinson disease Appears stable Resume home Sinemet   COPD Stable.  On room air.  No wheezing  Essential hypertension Continue home regimen  Hyperlipidemia High intensity statin  Obesity BMI 35.  Complicates overall care and prognosis   DVT prophylaxis: Eliquis  Code Status: DNR- pre arrest interventions Family Communication: Son at bedside Disposition Plan: Home versus SNF Consults called: None at this time Admission status: Inpatient, medical telemetry  CRITICAL CARE Performed by: Calvin KATHEE Robson   Total critical care time: 40 minutes  Critical care time was exclusive of separately billable procedures and treating other patients.  Critical care was necessary to treat or prevent imminent or life-threatening deterioration.  Critical care was time spent personally by me on the following activities: development of treatment plan with patient and/or surrogate as well as nursing, discussions with consultants, evaluation of patient's response to treatment, examination of  patient, obtaining history from patient or surrogate, ordering and performing treatments and interventions, ordering and review of laboratory studies, ordering and review of radiographic studies, pulse oximetry and re-evaluation of patient's condition.   Calvin KATHEE Robson MD [facility]   If 7PM-7AM, please contact night-coverage   05/17/2024, 3:35 PM

## 2024-05-17 NOTE — Progress Notes (Signed)
   05/17/24 2337  Assess: MEWS Score  ECG Heart Rate (!) 39  Resp 16  Assess: MEWS Score  MEWS Temp 0  MEWS Systolic 0  MEWS Pulse 2  MEWS RR 0  MEWS LOC 0  MEWS Score 2  MEWS Score Color Yellow  Assess: if the MEWS score is Yellow or Red  Were vital signs accurate and taken at a resting state? Yes  Does the patient meet 2 or more of the SIRS criteria? No  Notify: Charge Nurse/RN  Name of Charge Nurse/RN Notified Tamara  Assess: SIRS CRITERIA  SIRS Temperature  0  SIRS Respirations  0  SIRS Pulse 0  SIRS WBC 0  SIRS Score Sum  0

## 2024-05-17 NOTE — Progress Notes (Signed)
   05/17/24 2323  Vitals  Temp 97.9 F (36.6 C)  Temp Source Oral  BP (!) 108/47  MAP (mmHg) (!) 63  BP Location Left Arm  BP Method Automatic  Patient Position (if appropriate) Lying  Pulse Rate (!) 41  Pulse Rate Source Dinamap  ECG Heart Rate (!) 39  Resp 16  Level of Consciousness  Level of Consciousness Alert  MEWS COLOR  MEWS Score Color Yellow  Oxygen Therapy  SpO2 (!) 85 %  O2 Device Room Air  Pain Assessment  Pain Scale 0-10  Pain Score 0  PCA/Epidural/Spinal Assessment  Respiratory Pattern Regular;Unlabored  Glasgow Coma Scale  Eye Opening 4  Best Verbal Response (NON-intubated) 5  Best Motor Response 6  Glasgow Coma Scale Score 15  MEWS Score  MEWS Temp 0  MEWS Systolic 0  MEWS Pulse 2  MEWS RR 0  MEWS LOC 0  MEWS Score 2

## 2024-05-17 NOTE — Plan of Care (Signed)
   Problem: Education: Goal: Knowledge of General Education information will improve Description Including pain rating scale, medication(s)/side effects and non-pharmacologic comfort measures Outcome: Progressing

## 2024-05-17 NOTE — Progress Notes (Signed)
   05/17/24 2334  Assess: MEWS Score  Pulse Rate (!) 38  Resp 16  Assess: MEWS Score  MEWS Temp 0  MEWS Systolic 0  MEWS Pulse 2  MEWS RR 0  MEWS LOC 0  MEWS Score 2  MEWS Score Color Yellow  Assess: if the MEWS score is Yellow or Red  Were vital signs accurate and taken at a resting state? Yes  Does the patient meet 2 or more of the SIRS criteria? No  Notify: Charge Nurse/RN  Name of Charge Nurse/RN Notified Tamara  Assess: SIRS CRITERIA  SIRS Temperature  0  SIRS Respirations  0  SIRS Pulse 0  SIRS WBC 0  SIRS Score Sum  0

## 2024-05-17 NOTE — ED Provider Notes (Signed)
 [facility] Provider Note    Event Date/Time   First MD Initiated Contact with Patient 05/17/24 [phone]     (approximate)   History   Leg Swelling   HPI  Francisco [name] is a 78 year old male with history of CHF, lymphedema, chronic lower extremity weakness presenting to the emergency department for evaluation after a fall with leg pain.  Patient reports ongoing issues with redness and swelling in his legs for the last several months with progressive worsening.  Has had multiple falls over the past few weeks including 2 within the last 24 hours.  Reports that his legs give out on him, does not think he has struck his head.  Patient is on Eliquis .     Physical Exam   Triage Vital Signs: ED Triage Vitals  Encounter Vitals Group     BP 05/17/24 0955 139/67     Girls Systolic BP Percentile --      Girls Diastolic BP Percentile --      Boys Systolic BP Percentile --      Boys Diastolic BP Percentile --      Pulse Rate 05/17/24 0955 61     Resp 05/17/24 0955 (!) 22     Temp 05/17/24 0955 97.8 F (36.6 C)     Temp src --      SpO2 05/17/24 0955 97 %     Weight 05/17/24 0949 250 lb (113.4 kg)     Height 05/17/24 0949 5' 10 (1.778 m)     Head Circumference --      Peak Flow --      Pain Score 05/17/24 0948 10     Pain Loc --      Pain Education --      Exclude from Growth Chart --     Most recent vital signs: Vitals:   05/17/24 1430 05/17/24 1453  BP: 131/74   Pulse: (!) 55   Resp: 16   Temp:  98 F (36.7 C)  SpO2: 92%      Nursing notes and vital signs reviewed.  General: Adult male, lying in bed, awake interactive Head: Atraumatic Chest: Symmetric chest rise, no tenderness to palpation, but there is an area of healing ecchymosis along the right chest with associated swelling concerning for hematoma Cardiac: Regular rhythm and rate.  Respiratory: Lungs clear to auscultation Abdomen: Soft, nondistended. No tenderness to palpation.   Pelvis: Stable in AP and lateral compression.  MSK: No deformity to bilateral upper and lower extremity.  Does report pain along his proximal left leg.  There is redness and edema of the bilateral lower extremities.  Intact distal pulses. Neuro: Alert, oriented. GCS 15.  Skin: No evidence of burns or lacerations.   ED Results / Procedures / Treatments   Labs (all labs ordered are listed, but only abnormal results are displayed) Labs Reviewed  CBC WITH DIFFERENTIAL/PLATELET - Abnormal; Notable for the following components:      Result Value   RBC 2.69 (*)    Hemoglobin 9.4 (*)    HCT 30.4 (*)    MCV 113.0 (*)    MCH 34.9 (*)    RDW 16.3 (*)    nRBC 0.3 (*)    All other components within normal limits  COMPREHENSIVE METABOLIC PANEL WITH GFR - Abnormal; Notable for the following components:   Sodium 146 (*)    BUN 47 (*)    Creatinine, Ser 1.99 (*)    GFR, Estimated 34 (*)  All other components within normal limits  BRAIN NATRIURETIC PEPTIDE - Abnormal; Notable for the following components:   B Natriuretic Peptide 724.8 (*)    All other components within normal limits  MAGNESIUM      EKG EKG independently reviewed and interpreted by myself demonstrates:  EKG demonstrates low voltage rhythm at 59, QRS 124, difficult to appreciate PR or QTc with low voltage, no STEMI  RADIOLOGY Imaging independently reviewed and interpreted by myself demonstrates:  Lower extremity US  without evidence of DVT Hip x-Grecia Lynk without acute fracture CT head without acute bleed CT C-spine without fracture CT chest without acute traumatic injury  Formal Radiology Read:  US  Venous Img Lower Bilateral Result Date: 05/17/2024 CLINICAL DATA:  Bilateral lower extremity pain and swelling, left side greater than right. Varicose veins. Prostate carcinoma and melanoma. EXAM: BILATERAL LOWER EXTREMITY VENOUS DOPPLER ULTRASOUND TECHNIQUE: Gray-scale sonography with compression, as well as color and duplex  ultrasound, were performed to evaluate the deep venous system(s) from the level of the common femoral vein through the popliteal and proximal calf veins. COMPARISON:  02/27/2024 FINDINGS: VENOUS Normal compressibility of the common femoral, superficial femoral, and popliteal veins, as well as the visualized calf veins throughout both lower extremities. Visualized portions of profunda femoral vein and great saphenous vein unremarkable in both lower extremities. No filling defects to suggest DVT on grayscale or color Doppler imaging. Doppler waveforms show normal direction of venous flow, normal respiratory plasticity and response to augmentation in both lower extremities. A OTHER None. Limitations: none IMPRESSION: No evidence of DVT in either lower extremity. Electronically Signed   By: Norleen DELENA Kil M.D.   On: 05/17/2024 12:50   DG Hip Unilat W or Wo Pelvis 2-3 Views Left Result Date: 05/17/2024 EXAM: 2 or more VIEW(S) XRAY OF THE LEFT HIP 05/17/2024 11:40:12 AM COMPARISON: None available. CLINICAL HISTORY: Fall, leg pain. Leg swelling. FINDINGS: BONES AND JOINTS: No acute fracture or focal osseous lesion. The hip joint is maintained. Mild degenerative changes of left hip. Degenerative changes of visualized lower lumbar spine. SOFT TISSUES: The soft tissues are unremarkable. Surgical clips overlying pelvis. Vascular calcifications. IMPRESSION: 1. No acute fracture or dislocation of the left hip or visualized pelvis. 2. Mild degenerative changes of the left hip. Electronically signed by: Donnice Mania MD 05/17/2024 11:52 AM EDT RP Workstation: [id number]   CT Cervical Spine Wo Contrast Result Date: 05/17/2024 EXAM: CT CERVICAL SPINE WITHOUT CONTRAST 05/17/2024 11:28:11 AM TECHNIQUE: CT of the cervical spine was performed without the administration of intravenous contrast. Multiplanar reformatted images are provided for review. Automated exposure control, iterative reconstruction, and/or weight based adjustment of  the mA/kV was utilized to reduce the radiation dose to as low as reasonably achievable. COMPARISON: CT of the cervical spine dated 12/05/2000. CLINICAL HISTORY: Polytrauma, blunt. fallen x4 in 2 days. +blood thinners. Bruising on chest FINDINGS: CERVICAL SPINE: BONES AND ALIGNMENT: The patient is again noted to be status post bilateral laminectomies and bilateral posterolateral spinal fusion of C3 through C5. There is loosening of the orthopedic screws at C3 again demonstrated bilaterally. The patient is also again noted to be status post ACDF (anterior cervical discectomy and fusion) of C5 through C7, with completed fusion. There is no definite evidence of acute traumatic injury. The study is degraded by patient motion. DEGENERATIVE CHANGES: No significant degenerative changes. SOFT TISSUES: No prevertebral soft tissue swelling. VASCULATURE: A vascular stent is noted within the right common carotid artery. IMPRESSION: 1. No definite evidence of acute traumatic injury. 2. Status  post bilateral laminectomies and bilateral posterolateral spinal fusion of C3 through C5 with loosening of the orthopedic screws at C3 bilaterally. 3. Status post ACDF of C5 through C7 with completed fusion. 4. Study degraded by patient motion. Electronically signed by: Evalene Coho MD 05/17/2024 11:51 AM EDT RP Workstation: [id number]   CT Chest Wo Contrast Result Date: 05/17/2024 EXAM: CT CHEST WITHOUT CONTRAST 05/17/2024 11:28:11 AM TECHNIQUE: CT of the chest was performed without the administration of intravenous contrast. Multiplanar reformatted images are provided for review. Automated exposure control, iterative reconstruction, and/or weight based adjustment of the mA/kV was utilized to reduce the radiation dose to as low as reasonably achievable. COMPARISON: CT of the chest dated 12/05/2000. CLINICAL HISTORY: Chest trauma, blunt; right chest wall hematoma. fallen x4 in 2 days. +blood thinners. Bruising on chest FINDINGS:  MEDIASTINUM AND LYMPH NODES: The heart is mildly enlarged. There is moderate calcific coronary artery disease. There is moderate calcification within the thoracic aorta. The patient is status post ACDF of C5-6 and C6-7. No mediastinal, hilar or axillary lymphadenopathy. LUNGS AND PLEURA: There are streaky peribronchovascular opacities again demonstrated within the bases of the right middle and lower lobes which are likely chronic but could be recurrent. There is mild atelectasis present independently within the left lower lobe. No pleural effusion or pneumothorax. SOFT TISSUES/BONES: No acute abnormality of the bones or soft tissues. UPPER ABDOMEN: Limited images of the upper abdomen demonstrates no acute abnormality. IMPRESSION: 1. No acute findings. 2. Streaky peribronchovascular opacities in the bases of the right middle and lower lobes, likely chronic but could be recurrent. 3. Mild atelectasis in the left lower lobe. 4. Mildly enlarged heart with moderate calcific coronary artery disease and moderate calcification within the thoracic aorta. Electronically signed by: Evalene Coho MD 05/17/2024 11:46 AM EDT RP Workstation: [id number]   CT Head Wo Contrast Result Date: 05/17/2024 EXAM: CT HEAD WITHOUT CONTRAST 05/17/2024 11:28:11 AM TECHNIQUE: CT of the head was performed without the administration of intravenous contrast. Automated exposure control, iterative reconstruction, and/or weight based adjustment of the mA/kV was utilized to reduce the radiation dose to as low as reasonably achievable. COMPARISON: CT angiogram of the head dated 05/31/2023. CLINICAL HISTORY: Polytrauma, blunt. fallen x4 in 2 days. +blood thinners. Bruising on chest. FINDINGS: BRAIN AND VENTRICLES: No acute hemorrhage. No evidence of acute infarct. No hydrocephalus. No extra-axial collection. No mass effect or midline shift. ORBITS: Status post bilateral lens replacement. No acute abnormality. SINUSES: Mild mucosal disease within the  ethmoid and maxillary sinuses. No acute abnormality. SOFT TISSUES AND SKULL: No acute soft tissue abnormality. No skull fracture. IMPRESSION: 1. No acute intracranial abnormality. 2. Mild mucosal disease within the ethmoid and maxillary sinuses. Electronically signed by: Evalene Coho MD 05/17/2024 11:32 AM EDT RP Workstation: [id number]    PROCEDURES:  Critical Care performed: Yes, see critical care procedure note(s)  CRITICAL CARE Performed by: Nilsa Dade   Total critical care time: 32 minutes  Critical care time was exclusive of separately billable procedures and treating other patients.  Critical care was necessary to treat or prevent imminent or life-threatening deterioration.  Critical care was time spent personally by me on the following activities: development of treatment plan with patient and/or surrogate as well as nursing, discussions with consultants, evaluation of patient's response to treatment, examination of patient, obtaining history from patient or surrogate, ordering and performing treatments and interventions, ordering and review of laboratory studies, ordering and review of radiographic studies, pulse oximetry and re-evaluation of patient's condition.  Procedures   MEDICATIONS ORDERED IN ED: Medications  furosemide  (LASIX ) injection 60 mg (has no administration in time range)  HYDROmorphone  (DILAUDID ) injection 0.5 mg (has no administration in time range)  fentaNYL  (SUBLIMAZE ) injection 50 mcg (50 mcg Intravenous Given 05/17/24 1015)  HYDROmorphone  (DILAUDID ) injection 0.5 mg (0.5 mg Intravenous Given 05/17/24 1319)     IMPRESSION / MDM / ASSESSMENT AND PLAN / ED COURSE  I reviewed the triage vital signs and the nursing notes.  Differential diagnosis includes, but is not limited to, anemia, electrolyte abnormality, DVT, CHF exacerbation, cellulitis, hip fracture, dislocation, soft tissue injury, intracranial bleed, chest wall trauma  Patient's presentation is  most consistent with acute presentation with potential threat to life or bodily function.  78 year old male presenting with multiple falls, hip pain, leg swelling. Patient denies head strike, but with multiple recent falls and anticoagulated status will obtain CT head and C-spine.  Also has evidence of chest trauma on exam, will obtain CT of the chest as well.  With concerns for worsening leg pain and swelling, will also obtain DVT ultrasounds of the lower extremity.  Labs here with normal white blood cell count, downtrending hemoglobin at 9.4.  CMP with mild AKI with creatinine of 1.99.  Normal K and mag.  BNP elevated at 724.8.  Overall reassuring.  I am concerned that patient's worsening lower leg edema is likely related to fluid overload secondary to his CHF and is likely contributing to his weakness.  He has been on increasing doses of diuretics with an increase in his Bumex to 2 mg daily by his primary care doctor at the end of July.  Given his progressive symptoms despite this as well as his frequent falls, do think admission is reasonable for  further management.  I have ordered a dose of IV Lasix .  Will reach out to hospitalist team to discuss admission.  Clinical Course as of 05/17/24 1513  Fri May 17, 2024  1513 Case discussed with hospitalist team.  They will evaluate for anticipated admission. [NR]    Clinical Course User Index [NR] Levander Slate, MD     FINAL CLINICAL IMPRESSION(S) / ED DIAGNOSES   Final diagnoses:  Bilateral lower extremity edema  Left leg pain  Acute on chronic congestive heart failure, unspecified heart failure type (HCC)     Rx / DC Orders   ED Discharge Orders     None        Note:  This document was prepared using Dragon voice recognition software and may include unintentional dictation errors.   Levander Slate, MD 05/17/24 [id number]

## 2024-05-17 NOTE — Progress Notes (Signed)
   05/17/24 2331  Assess: MEWS Score  Temp 97.9 F (36.6 C)  BP (!) 108/47  MAP (mmHg) (!) 63  Pulse Rate (!) 38  Resp 16  SpO2 100 %  O2 Device Nasal Cannula  O2 Flow Rate (L/min) 2 L/min  Assess: MEWS Score  MEWS Temp 0  MEWS Systolic 0  MEWS Pulse 2  MEWS RR 0  MEWS LOC 0  MEWS Score 2  MEWS Score Color Yellow  Assess: if the MEWS score is Yellow or Red  Were vital signs accurate and taken at a resting state? Yes  Does the patient meet 2 or more of the SIRS criteria? No  Notify: Charge Nurse/RN  Name of Charge Nurse/RN Notified Tamara  Assess: SIRS CRITERIA  SIRS Temperature  0  SIRS Respirations  0  SIRS Pulse 0  SIRS WBC 0  SIRS Score Sum  0

## 2024-05-17 NOTE — ED Triage Notes (Addendum)
 Pt to ED [facility] from [facility] for left leg pain, swelling redness noted to bilateral legs. Pt moaning on arrival. Reports pain to left hip also. Pt has fallen x4 in 2 days. +blood thinners. Denies hitting head with falls. Reports has cellulitis Wears O2 prn at night  Bruising noted to right chest

## 2024-05-17 NOTE — Progress Notes (Signed)
   05/17/24 2323  Assess: MEWS Score  Temp 97.9 F (36.6 C)  BP (!) 108/47  MAP (mmHg) (!) 63  Pulse Rate (!) 41  ECG Heart Rate (!) 39  Resp 16  Level of Consciousness Alert  SpO2 (!) 2 %  O2 Device Room Air  Assess: MEWS Score  MEWS Temp 0  MEWS Systolic 0  MEWS Pulse 2  MEWS RR 0  MEWS LOC 0  MEWS Score 2  MEWS Score Color Yellow  Assess: if the MEWS score is Yellow or Red  Were vital signs accurate and taken at a resting state? Yes  Does the patient meet 2 or more of the SIRS criteria? No  Notify: Charge Nurse/RN  Name of Charge Nurse/RN Notified Tamara  Assess: SIRS CRITERIA  SIRS Temperature  0  SIRS Respirations  0  SIRS Pulse 0  SIRS WBC 0  SIRS Score Sum  0

## 2024-05-17 NOTE — Consult Note (Signed)
 Pharmacy Antibiotic Note  Francisco [name] is a 78 y.o. male admitted on 05/17/2024 with superimposed cellulitis.  Pharmacy has been consulted for Cefazolin  dosing.  Plan: Give Ancef  2G q8h  Height: 5' 10 (177.8 cm) Weight: 113.4 kg (250 lb) IBW/kg (Calculated) : 73  Temp (24hrs), Avg:97.9 F (36.6 C), Min:97.8 F (36.6 C), Max:98 F (36.7 C)  Recent Labs  Lab 05/17/24 0951  WBC 6.8  CREATININE 1.99*    Estimated Creatinine Clearance: 38 mL/min (A) (by C-G formula based on SCr of 1.99 mg/dL (H)).    Allergies  Allergen Reactions   Azithromycin Other (See Comments)    Avoid due to myasthenia gravis   Codeine Nausea And Vomiting    Antimicrobials this admission: 0905  Ancef  2G >>   Microbiology results: None ordered  Thank you for allowing pharmacy to be a part of this patient's care.  Francisco [name] 05/17/2024 4:24 PM

## 2024-05-18 ENCOUNTER — Inpatient Hospital Stay

## 2024-05-18 DIAGNOSIS — R296 Repeated falls: Secondary | ICD-10-CM | POA: Diagnosis not present

## 2024-05-18 LAB — BASIC METABOLIC PANEL WITH GFR
Anion gap: 9 (ref 5–15)
BUN: 43 mg/dL — ABNORMAL HIGH (ref 8–23)
CO2: 33 mmol/L — ABNORMAL HIGH (ref 22–32)
Calcium: 8.3 mg/dL — ABNORMAL LOW (ref 8.9–10.3)
Chloride: 98 mmol/L (ref 98–111)
Creatinine, Ser: 1.82 mg/dL — ABNORMAL HIGH (ref 0.61–1.24)
GFR, Estimated: 37 mL/min — ABNORMAL LOW (ref >60)
Glucose, Bld: 80 mg/dL (ref 70–99)
Potassium: 3.9 mmol/L (ref 3.5–5.1)
Sodium: 140 mmol/L (ref 135–145)

## 2024-05-18 LAB — CBC
HCT: 28.2 % — ABNORMAL LOW (ref 39.0–52.0)
Hemoglobin: 8.7 g/dL — ABNORMAL LOW (ref 13.0–17.0)
MCH: 34.8 pg — ABNORMAL HIGH (ref 26.0–34.0)
MCHC: 30.9 g/dL (ref 30.0–36.0)
MCV: 112.8 fL — ABNORMAL HIGH (ref 80.0–100.0)
Platelets: 202 K/uL (ref 150–400)
RBC: 2.5 MIL/uL — ABNORMAL LOW (ref 4.22–5.81)
RDW: 16.6 % — ABNORMAL HIGH (ref 11.5–15.5)
WBC: 5.2 K/uL (ref 4.0–10.5)
nRBC: 0.4 % — ABNORMAL HIGH (ref 0.0–0.2)

## 2024-05-18 MED ORDER — DIAZEPAM 5 MG/ML IJ SOLN
10.0000 mg | Freq: Once | INTRAMUSCULAR | Status: AC
  Administered 2024-05-18: 10 mg via INTRAMUSCULAR
  Filled 2024-05-18: qty 2

## 2024-05-18 MED ORDER — GADOBUTROL 1 MMOL/ML IV SOLN
10.0000 mL | Freq: Once | INTRAVENOUS | Status: AC | PRN
  Administered 2024-05-18: 10 mL via INTRAVENOUS

## 2024-05-18 MED ORDER — HYDROMORPHONE HCL 1 MG/ML IJ SOLN
1.0000 mg | INTRAMUSCULAR | Status: DC | PRN
  Administered 2024-05-21 – 2024-05-22 (×2): 1 mg via INTRAVENOUS
  Filled 2024-05-18 (×2): qty 1

## 2024-05-18 MED ORDER — FENTANYL CITRATE PF 50 MCG/ML IJ SOSY
25.0000 ug | PREFILLED_SYRINGE | INTRAMUSCULAR | Status: DC | PRN
  Administered 2024-05-18 – 2024-05-20 (×3): 25 ug via INTRAVENOUS
  Filled 2024-05-18 (×3): qty 1

## 2024-05-18 MED ORDER — OXYCODONE HCL 5 MG PO TABS
10.0000 mg | ORAL_TABLET | ORAL | Status: DC | PRN
  Administered 2024-05-19 – 2024-05-20 (×4): 10 mg via ORAL
  Administered 2024-05-20: 15 mg via ORAL
  Administered 2024-05-21 – 2024-05-22 (×4): 10 mg via ORAL
  Filled 2024-05-18 (×2): qty 2
  Filled 2024-05-18: qty 3
  Filled 2024-05-18 (×7): qty 2

## 2024-05-18 MED ORDER — OXYCODONE HCL 5 MG PO TABS: 10.0000 mg | ORAL_TABLET | ORAL | Status: DC | PRN

## 2024-05-18 MED ORDER — PREGABALIN 50 MG PO CAPS
100.0000 mg | ORAL_CAPSULE | Freq: Two times a day (BID) | ORAL | Status: DC
  Administered 2024-05-18 – 2024-05-22 (×8): 100 mg via ORAL
  Filled 2024-05-18 (×8): qty 2

## 2024-05-18 NOTE — Progress Notes (Signed)
   05/18/24 0235  Assess: MEWS Score  BP (!) 107/46  MAP (mmHg) 66  ECG Heart Rate (!) 40  Resp 13  Level of Consciousness Alert  Assess: MEWS Score  MEWS Temp 0  MEWS Systolic 0  MEWS Pulse 1  MEWS RR 1  MEWS LOC 0  MEWS Score 2  MEWS Score Color Yellow  Assess: if the MEWS score is Yellow or Red  Were vital signs accurate and taken at a resting state? Yes  Does the patient meet 2 or more of the SIRS criteria? No  Notify: Charge Nurse/RN  Name of Charge Nurse/RN Notified Tamara  Assess: SIRS CRITERIA  SIRS Temperature  0  SIRS Respirations  0  SIRS Pulse 0  SIRS WBC 0  SIRS Score Sum  0

## 2024-05-18 NOTE — Evaluation (Signed)
 Occupational Therapy Evaluation Patient Details Name: Francisco [name] MRN: [id number] DOB: April 02, 1946 Today's Date: 05/18/2024   History of Present Illness   Francisco [name] is a 78 y.o. male with medical history significant of SVT, chronic atrial fibrillation, chronic anticoagulation Eliquis , Parkinson disease on Sinemet , hypertension, hyperlipidemia, COPD, peripheral vascular disease, diastolic congestive heart failure, history of CVA, hypothyroidism, bilateral carotid artery stenosis, myasthenia gravis who presents with frequent falls. The patient states that he has had no recent changes in medication.  He has noted his bilateral lower extremities to be increasingly swollen. His dose of torsemide  was just increased by PCP. Patient presented to the emergency room after 4 falls in the 24 hours and resultant severe pain in the left hip.     Clinical Impressions [name] moved into [facility] ~ 1 month ago, following his wife's death in 02/19/2024. He reports he uses a power WC for ambulating in the facility and has been IND in transferring from the [facility] to bed, toilet, chair, etc. During today's session, pt endorses 10/10 pain in his L LE, centered at the hip and radiating to his L knee. He is nevertheless willing to engage in therapy and is hopeful that transferring to the recliner may alleviate his pain. He requires Mod A +2 for supine<sit, needing support for both repositioning L LE and for trunk control. Pt requires a heavy Max A + 2 for coming into standing and transferring to the recliner. Pt yells out in pain with every movement. Pt requests additional pain medication. MD and RN notified. Pt is far from his baseline level of fxl mobility at present. Will continue to offer OT services while hospitalized. Given his current level of assistance required for all mobility, do not anticipate that [facility] would be able to accommodate pt's needs. Recommend DC to  facility where pt can receive daily rehab services, <3 hrs/day.     If plan is discharge home, recommend the following:   A lot of help with walking and/or transfers;A lot of help with bathing/dressing/bathroom;Help with stairs or ramp for entrance;Assistance with cooking/housework;Assist for transportation     Functional Status Assessment   Patient has had a recent decline in their functional status and demonstrates the ability to make significant improvements in function in a reasonable and predictable amount of time.     Equipment Recommendations   None recommended by OT     Recommendations for Other Services         Precautions/Restrictions   Precautions Precautions: Fall Restrictions Weight Bearing Restrictions Per Provider Order: No     Mobility Bed Mobility Overal bed mobility: Needs Assistance Bed Mobility: Supine to Sit     Supine to sit: Mod assist, +2 for physical assistance     General bed mobility comments: Assist for repositioning L LE, trunk control, 2/2 pain    Transfers Overall transfer level: Needs assistance Equipment used: 2 person hand held assist Transfers: Sit to/from Stand, Bed to chair/wheelchair/BSC Sit to Stand: Max assist, +2 physical assistance Stand pivot transfers: +2 physical assistance, Max assist                Balance Overall balance assessment: Needs assistance Sitting-balance support: Bilateral upper extremity supported Sitting balance-Leahy Scale: Fair     Standing balance support: Bilateral upper extremity supported Standing balance-Leahy Scale: Poor Standing balance comment: +2 support for maintaining standing balance for < 30 seconds  ADL either performed or assessed with clinical judgement   ADL Overall ADL's : Needs assistance/impaired Eating/Feeding: Modified independent               Upper Body Dressing : Supervision/safety;Set up   Lower Body  Dressing: Maximal assistance   Toilet Transfer: Maximal assistance             General ADL Comments: Max A + 2 for OOB ADLs     Vision         Perception         Praxis         Pertinent Vitals/Pain Pain Assessment Pain Assessment: 0-10 Pain Score: 10-Worst pain ever Pain Location: L hip, radiating down to L knee Pain Descriptors / Indicators: Constant, Moaning, Grimacing, Guarding Pain Intervention(s): Limited activity within patient's tolerance, Monitored during session, Patient requesting pain meds-RN notified, Repositioned     Extremity/Trunk Assessment Upper Extremity Assessment Upper Extremity Assessment: Generalized weakness;Overall WFL for tasks assessed   Lower Extremity Assessment Lower Extremity Assessment: Generalized weakness;LLE deficits/detail LLE Deficits / Details: pt endorses 10/10 L LE pain       Communication Communication Communication: No apparent difficulties   Cognition Arousal: Alert Behavior During Therapy: WFL for tasks assessed/performed Cognition: No apparent impairments                               Following commands: Intact       Cueing  General Comments      2+ pitting edema, bilateral LE   Exercises Other Exercises Other Exercises: Educ reL role of OT, POC, DC recs, non-pharmacological pain mgmt strategies   Shoulder Instructions      Home Living Family/patient expects to be discharged to:: Assisted living [facility]) Living Arrangements: Alone Available Help at Discharge: Family;Available PRN/intermittently (ALF staff) Type of Home: Independent living facility       Home Layout: One level     Bathroom Shower/Tub: Producer, television/film/video: Handicapped height     Home Equipment: Shower seat;Grab bars - tub/shower;Grab bars - toilet;Hand held shower head;Wheelchair - Building surveyor;Wheelchair - Surveyor, quantity (2 wheels);Cane - single point          Prior  Functioning/Environment Prior Level of Function : Needs assist             Mobility Comments: Uses power wheelchair for mobility. Reports numerous falls in previous few weeks ADLs Comments: Pt reports he is IND in dressing, toileting, transferring from bed to WC. ALF provides meals, transport, housecleaning.    OT Problem List: Decreased range of motion;Decreased activity tolerance;Impaired balance (sitting and/or standing);Pain   OT Treatment/Interventions: Self-care/ADL training;Therapeutic exercise;Patient/family education;Balance training;Energy conservation;Therapeutic activities;DME and/or AE instruction      OT Goals(Current goals can be found in the care plan section)   Acute Rehab OT Goals Patient Stated Goal: to not have pain OT Goal Formulation: With patient Time For Goal Achievement: 06/01/24 Potential to Achieve Goals: Good ADL Goals Pt Will Perform Lower Body Dressing: with min assist;sitting/lateral leans Pt Will Transfer to Toilet: with min assist;stand pivot transfer Additional ADL Goal #1: Pt will transfer bed<>WC with SUPV   OT Frequency:  Min 2X/week    Co-evaluation              AM-PAC OT 6 Clicks Daily Activity     Outcome Measure Help from another person eating meals?: None Help from another person taking care  of personal grooming?: A Little Help from another person toileting, which includes using toliet, bedpan, or urinal?: A Lot Help from another person bathing (including washing, rinsing, drying)?: A Lot Help from another person to put on and taking off regular upper body clothing?: A Little Help from another person to put on and taking off regular lower body clothing?: A Lot 6 Click Score: 16   End of Session Nurse Communication: Patient requests pain meds  Activity Tolerance: Patient limited by pain Patient left: in chair;with family/visitor present;with call bell/phone within reach  OT Visit Diagnosis: Unsteadiness on feet  (R26.81);Other abnormalities of gait and mobility (R26.89);Repeated falls (R29.6);Muscle weakness (generalized) (M62.81);Pain Pain - Right/Left: Left Pain - part of body: Leg                Time: 8957-8884 OT Time Calculation (min): 33 min Charges:  OT General Charges $OT Visit: 1 Visit OT Evaluation $OT Eval Moderate Complexity: 1 Mod OT Treatments $Self Care/Home Management : 23-37 mins Suzen Hock, PhD, MS, OTR/L 05/18/24, 11:42 AM

## 2024-05-18 NOTE — Progress Notes (Signed)
   05/18/24 0040  Assess: MEWS Score  ECG Heart Rate (!) 37  Resp 14  Assess: MEWS Score  MEWS Temp 0  MEWS Systolic 0  MEWS Pulse 2  MEWS RR 0  MEWS LOC 0  MEWS Score 2  MEWS Score Color Yellow  Assess: if the MEWS score is Yellow or Red  Were vital signs accurate and taken at a resting state? Yes  Does the patient meet 2 or more of the SIRS criteria? No  Notify: Charge Nurse/RN  Name of Charge Nurse/RN Notified Tamara  Assess: SIRS CRITERIA  SIRS Temperature  0  SIRS Respirations  0  SIRS Pulse 0  SIRS WBC 0  SIRS Score Sum  0

## 2024-05-18 NOTE — Progress Notes (Signed)
   05/18/24 0159  Assess: MEWS Score  ECG Heart Rate (!) 41  Resp 14  SpO2 100 %  O2 Device Nasal Cannula  O2 Flow Rate (L/min) 2 L/min  Assess: MEWS Score  MEWS Temp 0  MEWS Systolic 1  MEWS Pulse 1  MEWS RR 0  MEWS LOC 0  MEWS Score 2  MEWS Score Color Yellow  Assess: if the MEWS score is Yellow or Red  Were vital signs accurate and taken at a resting state? Yes  Does the patient meet 2 or more of the SIRS criteria? No  Notify: Charge Nurse/RN  Name of Charge Nurse/RN Notified Tamara  Assess: SIRS CRITERIA  SIRS Temperature  0  SIRS Respirations  0  SIRS Pulse 0  SIRS WBC 0  SIRS Score Sum  0

## 2024-05-18 NOTE — Progress Notes (Signed)
   05/18/24 0426  Assess: MEWS Score  Temp 97.9 F (36.6 C)  BP (!) 148/59  MAP (mmHg) 83  Pulse Rate (!) 40  ECG Heart Rate (!) 39  Resp 16  SpO2 100 %  O2 Device Nasal Cannula  Assess: MEWS Score  MEWS Temp 0  MEWS Systolic 0  MEWS Pulse 2  MEWS RR 0  MEWS LOC 0  MEWS Score 2  MEWS Score Color Yellow  Assess: if the MEWS score is Yellow or Red  Were vital signs accurate and taken at a resting state? Yes  Does the patient meet 2 or more of the SIRS criteria? No  Notify: Charge Nurse/RN  Name of Charge Nurse/RN Notified Tamara  Assess: SIRS CRITERIA  SIRS Temperature  0  SIRS Respirations  0  SIRS Pulse 0  SIRS WBC 0  SIRS Score Sum  0

## 2024-05-18 NOTE — Evaluation (Signed)
 Physical Therapy Evaluation Patient Details Name: Francisco [name] MRN: [id number] DOB: 06/21/1946 Today's Date: 05/18/2024  History of Present Illness  Francisco [name] is a 78 y.o. male with medical history significant of SVT, chronic atrial fibrillation, chronic anticoagulation Eliquis , Parkinson disease on Sinemet , hypertension, hyperlipidemia, COPD, peripheral vascular disease, diastolic congestive heart failure, history of CVA, hypothyroidism, bilateral carotid artery stenosis, myasthenia gravis who presents with frequent falls. The patient states that he has had no recent changes in medication.  He has noted his bilateral lower extremities to be increasingly swollen. His dose of torsemide  was just increased by PCP. Patient presented to the emergency room after 4 falls in the 24 hours and resultant severe pain in the left hip.   Clinical Impression  Patient received reclining in bed with L knee in flexion propped up on pillows reporting he is feeling better since achieving this position and that he would rather not get up right now but is agreeable to PT evaluation with encouragement. Patient pre-medicated about 1-1.5 hours prior to session. Patient recently moved to [facility] where he is mod I with transfers and uses a power wheelchair for mobility at baseline. He is mod I with ADLs and facility staff provide assistance with meals, cleaning, transportation. Upon PT evaluation, patient required mod A for supine to sit bed mobility and bed to chair transfer using modified squat pivot technique. Attempts were made to keep L LE out of position of sciatic nerve tension, and patient found his pain much more comfortable with L knee flexed. He had up to 10/10 momentarily during transfer, but pain returned down to 3/10 after repositioning. He demonstrated poor motor control and strength in the L LE, requiring physical assistance from PT to advance it along the floor during transfer. Attempt to  transfer towards the left as pt does at home was unsuccessful.  Patient demonstrates a significant decline in functional independence and mobility and requires more physical assistance with mobility than is available at an independent living facility at this time. Patient would benefit from skilled physical therapy to address impairments and functional limitations (see PT Problem List below) to work towards stated goals and return to PLOF or maximal functional independence.        If plan is discharge home, recommend the following: Two people to help with walking and/or transfers;Two people to help with bathing/dressing/bathroom;Help with stairs or ramp for entrance;Assist for transportation;Assistance with cooking/housework   Can travel by private vehicle   No    Equipment Recommendations Other (comment) (to be determined in next level of care)  Recommendations for Other Services       Functional Status Assessment Patient has had a recent decline in their functional status and demonstrates the ability to make significant improvements in function in a reasonable and predictable amount of time.     Precautions / Restrictions Precautions Precautions: Fall Restrictions Weight Bearing Restrictions Per Provider Order: No      Mobility  Bed Mobility   Bed Mobility: Supine to Sit     Supine to sit: Mod assist, HOB elevated     General bed mobility comments: assistance at trunk and legs    Transfers Overall transfer level: Needs assistance Equipment used: None Transfers: Bed to chair/wheelchair/BSC       Squat pivot transfers: Mod assist     General transfer comment: First attempted transfer towards the left as this is how patient completes it at home (unable). Completed transfer towards R LE from bed  to chair with pt placing hands on chair to help stand with PT moving L LE that would not move, then pt shifting buttocks to the right and into the chair with PT providing  assistacne at the buttocks from bed to chair as in a squat pivot.    Ambulation/Gait               General Gait Details: pt does not walk at baseline  Stairs            Wheelchair Mobility     Tilt Bed    Modified Rankin (Stroke Patients Only)       Balance Overall balance assessment: Needs assistance Sitting-balance support: Bilateral upper extremity supported Sitting balance-Leahy Scale: Fair       Standing balance-Leahy Scale: Zero Standing balance comment: did not make it to uprgith standing                             Pertinent Vitals/Pain Pain Assessment Pain Assessment: 0-10 Pain Score: 10-Worst pain ever Pain Location: L anteriolateral thigh, 2/10 resting w/R knee flexed prior to session, momentary 10/10 during transfer and transfer attempt, back to 3/10 resting at end of session with knee flexed. Pain Descriptors / Indicators: Guarding, Sharp, Grimacing, Moaning Pain Intervention(s): Limited activity within patient's tolerance, Monitored during session, Premedicated before session, Repositioned (placed L knee in flexion to remove nerve tension (avoid L-shaped body positions))    Home Living Family/patient expects to be discharged to:: Assisted living [facility]) Living Arrangements: Alone Available Help at Discharge: Available PRN/intermittently;Other (Comment) (ALF staff) Type of Home: Independent living facility         Home Layout: One level Home Equipment: Shower seat;Grab bars - tub/shower;Grab bars - toilet;Hand held shower head;Wheelchair - Building surveyor;Wheelchair - Surveyor, quantity (2 wheels);Cane - single point      Prior Function Prior Level of Function : Needs assist             Mobility Comments: Uses power wheelchair for mobility. Reports numerous falls in previous few weeks ADLs Comments: Pt reports he is IND in dressing, toileting, transferring from bed to WC. ALF provides meals, transport,  housecleaning.     Extremity/Trunk Assessment   Upper Extremity Assessment Upper Extremity Assessment: Overall WFL for tasks assessed;Generalized weakness    Lower Extremity Assessment Lower Extremity Assessment: LLE deficits/detail (R LE generalized weakness) LLE Deficits / Details: pt endorses 10/10 L LE pain with mobility, weakness noted on L LE so PT has to advance it along the floor during transfer LLE: Unable to fully assess due to pain       Communication   Communication Communication: No apparent difficulties    Cognition Arousal: Alert Behavior During Therapy: WFL for tasks assessed/performed                             Following commands: Intact       Cueing       General Comments      Exercises Other Exercises Other Exercises: pt educated on positioning to prevent sciatic nerve tension, to avoid L-shaped positions with the body and flex the knee for improved comfort.   Assessment/Plan    PT Assessment Patient needs continued PT services  PT Problem List Decreased strength;Decreased mobility;Impaired tone;Decreased range of motion;Decreased coordination;Decreased activity tolerance;Decreased balance;Pain;Obesity       PT Treatment Interventions DME instruction;Therapeutic exercise;Balance training;Neuromuscular  re-education;Functional mobility training;Therapeutic activities;Patient/family education    PT Goals (Current goals can be found in the Care Plan section)  Acute Rehab PT Goals Patient Stated Goal: feel better and return home PT Goal Formulation: With patient Time For Goal Achievement: 06/01/24 Potential to Achieve Goals: Good    Frequency Min 2X/week     Co-evaluation               AM-PAC PT 6 Clicks Mobility  Outcome Measure Help needed turning from your back to your side while in a flat bed without using bedrails?: A Lot Help needed moving from lying on your back to sitting on the side of a flat bed without using  bedrails?: A Lot Help needed moving to and from a bed to a chair (including a wheelchair)?: A Lot Help needed standing up from a chair using your arms (e.g., wheelchair or bedside chair)?: A Lot Help needed to walk in hospital room?: Total Help needed climbing 3-5 steps with a railing? : Total 6 Click Score: 10    End of Session Equipment Utilized During Treatment: Gait belt Activity Tolerance: Patient limited by pain Patient left: in chair;with call bell/phone within reach;with chair alarm set Nurse Communication: Mobility status PT Visit Diagnosis: Muscle weakness (generalized) (M62.81);Repeated falls (R29.6);Other abnormalities of gait and mobility (R26.89);Pain Pain - Right/Left: Left Pain - part of body: Hip;Knee    Time: 8551-8482 PT Time Calculation (min) (ACUTE ONLY): 29 min   Charges:   PT Evaluation $PT Eval High Complexity: 1 High PT Treatments $Therapeutic Activity: 8-22 mins PT General Charges $$ ACUTE PT VISIT: 1 Visit         Camie R. Juli, PT, DPT, Cert. MDT 05/18/24, 3:46 PM

## 2024-05-18 NOTE — Plan of Care (Signed)
   Problem: Education: Goal: Knowledge of General Education information will improve Description Including pain rating scale, medication(s)/side effects and non-pharmacologic comfort measures Outcome: Progressing

## 2024-05-18 NOTE — Plan of Care (Signed)

## 2024-05-18 NOTE — Progress Notes (Signed)
 PROGRESS NOTE    Francisco [name]  FMW:[id number] DOB: 1945-12-12 DOA: 05/17/2024 PCP: Fernande Ophelia JINNY DOUGLAS, MD    Brief Narrative:  78 y.o. male with medical history significant of SVT, chronic atrial fibrillation, chronic anticoagulation Eliquis , Parkinson disease on Sinemet , hypertension, hyperlipidemia, COPD, peripheral vascular disease, diastolic congestive heart failure, history of CVA, hypothyroidism, bilateral carotid artery stenosis, myasthenia gravis who presents with frequent falls.   The patient states that he has had no recent changes in medication.  He has noted his bilateral lower extremities to be increasingly swollen.  His dose of torsemide  was just increased by PCP.  Patient presented to the emergency room after 4 falls in the 24 hours and resultant severe pain in the left hip.   Patient endorses ongoing or progressive issues with redness and edema lower extremities associated with pain.  Has been progressively worsening.  Patient is anticoagulant on Eliquis .  Reports that his legs gave out on him.  Does not report loss of consciousness.  Does not report head trauma.   Imaging survey and emergency room overall reassuring.  No evidence of bleed.  No evidence of fracture on imaging of the hip.  Laboratory vesication significant for AKI on CKD.   On my evaluation patient is resting in bed.  Son at bedside.  Patient appears visibly in pain.  He does answer all questions appropriately.  Vital signs reassuring at time of my evaluation.   Assessment & Plan:   Principal Problem:   Frequent falls  Acute on chronic diastolic congestive heart failure Difficult to assess volume status given overall body habitus but patient does have edema on the lower extremities and elevated BNP higher than normal Plan: Continue Lasix  40 mg IV daily Continue home Aldactone  Continue home beta-blocker Strict ins and outs, daily weights Fluid restricted diet Target net -1-1.5 L daily, currently at  goal  Intractable back pain Low back, new lumbar spine.  X-ray imaging survey negative for fracture or dislocation of left hip.  Pain starts in lower spine and radiates down to left knee.  Strongly suspect sciatica. Plan: Aggressive multimodal pain control Valium  10 mg IM x 1   Bilateral lower extremity edema Superimposed cellulitis Chronic lymphedema Etiology of bilateral lower extremity edema is somewhat unclear.  Legs are erythematous and tender to touch which speaks more towards a cellulitic process.  However patient does not have a white count or fever to suggest underlying sepsis.  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   Again volume status difficult to ascertain.   Does not appear dry Diuresis as above Daily renal parameters Sodium level improved   Paroxysmal atrial fibrillation History of SVT Heart rate currently controlled, currently junctional rhythm Plan: Continue home Lopressor  Continue home Eliquis    History of myasthenia gravis Continue home prednisone  10 mg Monday Wednesday Friday Continue home Imuran    Bilateral carotid artery stenosis Previously on Plavix , however this appears to have been discontinued Continue home Eliquis  High intensity statin   Hypothyroidism Continue Synthroid    Parkinson disease Appears stable Continue home Sinemet    COPD Stable.  On room air.  No wheezing   Essential hypertension Continue home regimen   Hyperlipidemia Continue high intensity statin   Obesity BMI 35.  Complicates overall care and prognosis   DVT prophylaxis: Apixaban  Code Status: DNR Family Communication: Son via  phone Disposition Plan: Status is: Inpatient Remains inpatient appropriate because: Intractable back pain   Level of care: Telemetry Medical  Consultants:  None  Procedures:  None  Antimicrobials: Ancef    Subjective: Seen and examined.  Still in substantial amount of back pain.  Objective: Vitals:   05/18/24 0313 05/18/24 0426 05/18/24 0513 05/18/24 0635  BP:  (!) 148/59    Pulse:  (!) 40    Resp: 16 16 17 20   Temp:  97.9 F (36.6 C)    TempSrc:      SpO2:  100%    Weight:      Height:        Intake/Output Summary (Last 24 hours) at 05/18/2024 1418 Last data filed at 05/18/2024 1100 Gross per 24 hour  Intake 580 ml  Output 1850 ml  Net -1270 ml   Filed Weights   05/17/24 0949 05/18/24 0131  Weight: 113.4 kg 112.8 kg    Examination:  General exam: Appears uncomfortable Respiratory system: Clear to auscultation. Respiratory effort normal. Cardiovascular system: 1 S2, RRR, no murmurs, 1+ pedal edema BLE Gastrointestinal system: Obese, soft, NT/ND, normal bowel sounds Central nervous system: Alert and oriented. No focal neurological deficits. Extremities: Left lower extremity.  Decreased range of motion Skin: Bilateral lower extremities erythematous, tender to touch Psychiatry: Judgement and insight appear normal. Mood & affect appropriate.     Data Reviewed: I have personally reviewed following labs and imaging studies  CBC: Recent Labs  Lab 05/17/24 0951 05/18/24 0417  WBC 6.8 5.2  NEUTROABS 4.9  --   HGB 9.4* 8.7*  HCT 30.4* 28.2*  MCV 113.0* 112.8*  PLT 237 202   Basic Metabolic Panel: Recent Labs  Lab 05/17/24 0951 05/18/24 0417  NA 146* 140  K 4.0 3.9  CL 99 98  CO2 32 33*  GLUCOSE 91 80  BUN 47* 43*  CREATININE 1.99* 1.82*  CALCIUM  9.1 8.3*  MG 2.2  --    GFR: Estimated Creatinine Clearance: 41.4 mL/min (A) (by C-G formula based on SCr of 1.82 mg/dL (H)). Liver Function Tests: Recent Labs  Lab 05/17/24 0951  AST 16  ALT 7   ALKPHOS 52  BILITOT 1.0  PROT 6.5  ALBUMIN  3.6   No results for input(s): LIPASE, AMYLASE in the last 168 hours. No results for input(s): AMMONIA in the last 168 hours. Coagulation Profile: No results for input(s): INR, PROTIME in the last 168 hours. Cardiac Enzymes: No results for input(s): CKTOTAL, CKMB, CKMBINDEX, TROPONINI in the last 168 hours. BNP (last 3 results) No results for input(s): PROBNP in the last 8760 hours. HbA1C: No results for input(s): HGBA1C in the last 72 hours. CBG: No results for input(s): GLUCAP in the last 168 hours. Lipid Profile: No results for input(s): CHOL, HDL, LDLCALC, TRIG, CHOLHDL, LDLDIRECT in the last 72 hours. Thyroid  Function Tests: No results for input(s): TSH, T4TOTAL, FREET4, T3FREE, THYROIDAB in the last 72 hours. Anemia Panel: No results for input(s): VITAMINB12, FOLATE, FERRITIN, TIBC, IRON , RETICCTPCT in the last 72 hours. Sepsis Labs: No results for input(s): PROCALCITON, LATICACIDVEN in the last 168 hours.  No results found for this or any previous visit (from the past 240 hours).       Radiology Studies: US  Venous Img Lower Bilateral Result Date: 05/17/2024 CLINICAL DATA:  Bilateral lower extremity pain and swelling, left side greater than right. Varicose veins. Prostate carcinoma  and melanoma. EXAM: BILATERAL LOWER EXTREMITY VENOUS DOPPLER ULTRASOUND TECHNIQUE: Gray-scale sonography with compression, as well as color and duplex ultrasound, were performed to evaluate the deep venous system(s) from the level of the common femoral vein through the popliteal and proximal calf veins. COMPARISON:  02/27/2024 FINDINGS: VENOUS Normal compressibility of the common femoral, superficial femoral, and popliteal veins, as well as the visualized calf veins throughout both lower extremities. Visualized portions of profunda femoral vein and great saphenous vein unremarkable in both lower  extremities. No filling defects to suggest DVT on grayscale or color Doppler imaging. Doppler waveforms show normal direction of venous flow, normal respiratory plasticity and response to augmentation in both lower extremities. A OTHER None. Limitations: none IMPRESSION: No evidence of DVT in either lower extremity. Electronically Signed   By: Norleen DELENA Kil M.D.   On: 05/17/2024 12:50   DG Hip Unilat W or Wo Pelvis 2-3 Views Left Result Date: 05/17/2024 EXAM: 2 or more VIEW(S) XRAY OF THE LEFT HIP 05/17/2024 11:40:12 AM COMPARISON: None available. CLINICAL HISTORY: Fall, leg pain. Leg swelling. FINDINGS: BONES AND JOINTS: No acute fracture or focal osseous lesion. The hip joint is maintained. Mild degenerative changes of left hip. Degenerative changes of visualized lower lumbar spine. SOFT TISSUES: The soft tissues are unremarkable. Surgical clips overlying pelvis. Vascular calcifications. IMPRESSION: 1. No acute fracture or dislocation of the left hip or visualized pelvis. 2. Mild degenerative changes of the left hip. Electronically signed by: Donnice Mania MD 05/17/2024 11:52 AM EDT RP Workstation: [id number]   CT Cervical Spine Wo Contrast Result Date: 05/17/2024 EXAM: CT CERVICAL SPINE WITHOUT CONTRAST 05/17/2024 11:28:11 AM TECHNIQUE: CT of the cervical spine was performed without the administration of intravenous contrast. Multiplanar reformatted images are provided for review. Automated exposure control, iterative reconstruction, and/or weight based adjustment of the mA/kV was utilized to reduce the radiation dose to as low as reasonably achievable. COMPARISON: CT of the cervical spine dated 12/05/2000. CLINICAL HISTORY: Polytrauma, blunt. fallen x4 in 2 days. +blood thinners. Bruising on chest FINDINGS: CERVICAL SPINE: BONES AND ALIGNMENT: The patient is again noted to be status post bilateral laminectomies and bilateral posterolateral spinal fusion of C3 through C5. There is loosening of the orthopedic  screws at C3 again demonstrated bilaterally. The patient is also again noted to be status post ACDF (anterior cervical discectomy and fusion) of C5 through C7, with completed fusion. There is no definite evidence of acute traumatic injury. The study is degraded by patient motion. DEGENERATIVE CHANGES: No significant degenerative changes. SOFT TISSUES: No prevertebral soft tissue swelling. VASCULATURE: A vascular stent is noted within the right common carotid artery. IMPRESSION: 1. No definite evidence of acute traumatic injury. 2. Status post bilateral laminectomies and bilateral posterolateral spinal fusion of C3 through C5 with loosening of the orthopedic screws at C3 bilaterally. 3. Status post ACDF of C5 through C7 with completed fusion. 4. Study degraded by patient motion. Electronically signed by: Evalene Coho MD 05/17/2024 11:51 AM EDT RP Workstation: [id number]   CT Chest Wo Contrast Result Date: 05/17/2024 EXAM: CT CHEST WITHOUT CONTRAST 05/17/2024 11:28:11 AM TECHNIQUE: CT of the chest was performed without the administration of intravenous contrast. Multiplanar reformatted images are provided for review. Automated exposure control, iterative reconstruction, and/or weight based adjustment of the mA/kV was utilized to reduce the radiation dose to as low as reasonably achievable. COMPARISON: CT of the chest dated 12/05/2000. CLINICAL HISTORY: Chest trauma, blunt; right chest wall hematoma. fallen x4 in 2 days. +blood thinners. Bruising on  chest FINDINGS: MEDIASTINUM AND LYMPH NODES: The heart is mildly enlarged. There is moderate calcific coronary artery disease. There is moderate calcification within the thoracic aorta. The patient is status post ACDF of C5-6 and C6-7. No mediastinal, hilar or axillary lymphadenopathy. LUNGS AND PLEURA: There are streaky peribronchovascular opacities again demonstrated within the bases of the right middle and lower lobes which are likely chronic but could be  recurrent. There is mild atelectasis present independently within the left lower lobe. No pleural effusion or pneumothorax. SOFT TISSUES/BONES: No acute abnormality of the bones or soft tissues. UPPER ABDOMEN: Limited images of the upper abdomen demonstrates no acute abnormality. IMPRESSION: 1. No acute findings. 2. Streaky peribronchovascular opacities in the bases of the right middle and lower lobes, likely chronic but could be recurrent. 3. Mild atelectasis in the left lower lobe. 4. Mildly enlarged heart with moderate calcific coronary artery disease and moderate calcification within the thoracic aorta. Electronically signed by: Evalene Coho MD 05/17/2024 11:46 AM EDT RP Workstation: [id number]   CT Head Wo Contrast Result Date: 05/17/2024 EXAM: CT HEAD WITHOUT CONTRAST 05/17/2024 11:28:11 AM TECHNIQUE: CT of the head was performed without the administration of intravenous contrast. Automated exposure control, iterative reconstruction, and/or weight based adjustment of the mA/kV was utilized to reduce the radiation dose to as low as reasonably achievable. COMPARISON: CT angiogram of the head dated 05/31/2023. CLINICAL HISTORY: Polytrauma, blunt. fallen x4 in 2 days. +blood thinners. Bruising on chest. FINDINGS: BRAIN AND VENTRICLES: No acute hemorrhage. No evidence of acute infarct. No hydrocephalus. No extra-axial collection. No mass effect or midline shift. ORBITS: Status post bilateral lens replacement. No acute abnormality. SINUSES: Mild mucosal disease within the ethmoid and maxillary sinuses. No acute abnormality. SOFT TISSUES AND SKULL: No acute soft tissue abnormality. No skull fracture. IMPRESSION: 1. No acute intracranial abnormality. 2. Mild mucosal disease within the ethmoid and maxillary sinuses. Electronically signed by: Timothy Berrigan MD 05/17/2024 11:32 AM EDT RP Workstation: [id number]        Scheduled Meds:  amiodarone   200 mg Oral Daily   apixaban   5 mg Oral BID    azaTHIOprine   150 mg Oral Daily   carbidopa -levodopa   1 tablet Oral TID   cholecalciferol   2,000 Units Oral Daily   cyanocobalamin   1,000 mcg Oral Daily   furosemide   40 mg Intravenous Daily   levothyroxine   50 mcg Oral Q0600   lidocaine   1 patch Transdermal Q24H   methocarbamol  (ROBAXIN ) injection  1,000 mg Intravenous Q8H   potassium chloride   10 mEq Oral Daily   predniSONE   10 mg Oral Q M,W,F   pregabalin   100 mg Oral BID   spironolactone   25 mg Oral Daily   Continuous Infusions:   ceFAZolin  (ANCEF ) IV 2 g (05/18/24 1318)     LOS: 1 day    Calvin KATHEE Robson, MD [facility]   If 7PM-7AM, please contact night-coverage  05/18/2024, 2:18 PM

## 2024-05-19 DIAGNOSIS — R296 Repeated falls: Secondary | ICD-10-CM | POA: Diagnosis not present

## 2024-05-19 LAB — BASIC METABOLIC PANEL WITH GFR
Anion gap: 13 (ref 5–15)
BUN: 40 mg/dL — ABNORMAL HIGH (ref 8–23)
CO2: 28 mmol/L (ref 22–32)
Calcium: 8.3 mg/dL — ABNORMAL LOW (ref 8.9–10.3)
Chloride: 96 mmol/L — ABNORMAL LOW (ref 98–111)
Creatinine, Ser: 2.02 mg/dL — ABNORMAL HIGH (ref 0.61–1.24)
GFR, Estimated: 33 mL/min — ABNORMAL LOW (ref >60)
Glucose, Bld: 97 mg/dL (ref 70–99)
Potassium: 4.1 mmol/L (ref 3.5–5.1)
Sodium: 137 mmol/L (ref 135–145)

## 2024-05-19 LAB — CBC WITH DIFFERENTIAL/PLATELET
Abs Immature Granulocytes: 0.06 K/uL (ref 0.00–0.07)
Basophils Absolute: 0 K/uL (ref 0.0–0.1)
Basophils Relative: 1 %
Eosinophils Absolute: 0.1 K/uL (ref 0.0–0.5)
Eosinophils Relative: 1 %
HCT: 29.6 % — ABNORMAL LOW (ref 39.0–52.0)
Hemoglobin: 9.3 g/dL — ABNORMAL LOW (ref 13.0–17.0)
Immature Granulocytes: 1 %
Lymphocytes Relative: 10 %
Lymphs Abs: 0.7 K/uL (ref 0.7–4.0)
MCH: 34.8 pg — ABNORMAL HIGH (ref 26.0–34.0)
MCHC: 31.4 g/dL (ref 30.0–36.0)
MCV: 110.9 fL — ABNORMAL HIGH (ref 80.0–100.0)
Monocytes Absolute: 1.3 K/uL — ABNORMAL HIGH (ref 0.1–1.0)
Monocytes Relative: 19 %
Neutro Abs: 4.5 K/uL (ref 1.7–7.7)
Neutrophils Relative %: 68 %
Platelets: 193 K/uL (ref 150–400)
RBC: 2.67 MIL/uL — ABNORMAL LOW (ref 4.22–5.81)
RDW: 16.1 % — ABNORMAL HIGH (ref 11.5–15.5)
Smear Review: NORMAL
WBC: 6.6 K/uL (ref 4.0–10.5)
nRBC: 1.2 % — ABNORMAL HIGH (ref 0.0–0.2)

## 2024-05-19 MED ORDER — DEXAMETHASONE SODIUM PHOSPHATE 10 MG/ML IJ SOLN
4.0000 mg | Freq: Four times a day (QID) | INTRAMUSCULAR | Status: DC
  Administered 2024-05-19 – 2024-05-21 (×8): 4 mg via INTRAVENOUS
  Filled 2024-05-19 (×8): qty 1

## 2024-05-19 NOTE — Plan of Care (Signed)

## 2024-05-19 NOTE — TOC Initial Note (Addendum)
 Transition of Care [name]) - Initial/Assessment Note    Patient Details  Name: Francisco [name] MRN: [id number] Date of Birth: June 16, 1946  [facility] CM/SW Contact:    Francisco L Alexismarie Flaim, LCSW Phone Number: 05/19/2024, 8:29 AM  Clinical Narrative:                  Brief assessment completed with patient's daughter, Francisco [name]. Francisco advised that she and her sister Francisco [name] are patients biological children. Francisco [name] is the patients step son. Patients spouse is deceased. Patient resides at [facility].   Recommendations regarding SNF were discussed. Francisco is in agreement that patient should discharge to a facility. Francisco advised that patient and his spouse received rehabilitation at [facility]. Francisco advised that she would like a bed search for that facility. She stated that her siblings would agree and she would relay information to them.   Per the attending, patient should be medically stable in a few days for discharge to SNF. Bed search will be started. Auth won't be initiated until patient is near medical readiness.    9:31am- CSW met with patient to discuss recommendations for SNF. Patient advised of discussion with daughter Francisco. Patient stated that he did not want to go to [facility]. He wants a bed search for other facilities but was not able to provide a choice. He stated that Francisco [name] could not get him out of his chair to do rehab. Patient was A&O. He was able to verbally communicate his concerns. Patient is aware of his medical condition and needs.       2:16pm: [name] is considering patient however, patient is in his co-pay days and will have a co-pay of $203 a day. CSW contacted daughter, Francisco to advise. Daughter stated that she will review information with siblings and she will call CSW back. She stated that son, Francisco [name], is the lead when it comes to making decisions for patient.    Patient Goals and CMS Choice             Expected Discharge Plan and Services                                              Prior Living Arrangements/Services                       Activities of Daily Living   ADL Screening (condition at time of admission) Independently performs ADLs?: No Does the patient have a NEW difficulty with bathing/dressing/toileting/self-feeding that is expected to last >3 days?: Yes (Initiates electronic notice to provider for possible OT consult) Does the patient have a NEW difficulty with getting in/out of bed, walking, or climbing stairs that is expected to last >3 days?: Yes (Initiates electronic notice to provider for possible PT consult) Does the patient have a NEW difficulty with communication that is expected to last >3 days?: No Is the patient deaf or have difficulty hearing?: No Does the patient have difficulty seeing, even when wearing glasses/contacts?: No Does the patient have difficulty concentrating, remembering, or making decisions?: No  Permission Sought/Granted                  Emotional Assessment              Admission diagnosis:  Left leg pain [  M79.605] Frequent falls [R29.6] Bilateral lower extremity edema [R60.0] Acute on chronic congestive heart failure, unspecified heart failure type Francisco [name]) [I50.9] Patient Active Problem List   Diagnosis Date Noted   Frequent falls 05/17/2024   Acute on chronic diastolic CHF (congestive heart failure) (HCC) 02/27/2024   Acute diastolic CHF (congestive heart failure) (HCC) 02/27/2024   Cellulitis 02/27/2024   Stroke (HCC) 05/31/2023   Obesity (BMI 30-39.9) 05/31/2023   HLD (hyperlipidemia) 05/31/2023   Chronic diastolic CHF (congestive heart failure) (HCC) 05/31/2023   Hyperkalemia 05/31/2023   SVT (supraventricular tachycardia) (HCC) 12/30/2021   Hypothyroidism 12/30/2021   Parkinson disease (HCC) 12/30/2021   Elevated troponin 12/30/2021   Lumbar stenosis with neurogenic claudication  05/03/2021   Acquired thrombophilia (HCC) 01/13/2021   History of decompression of median nerve 01/01/2021   Paroxysmal atrial fibrillation (HCC) 10/06/2020   Carotid stenosis, right 09/17/2020   S/P cervical spinal fusion    Leukocytosis    Essential hypertension    Anemia of chronic disease    Postoperative pain    Neuropathic pain    Cervical myelopathy (HCC) 08/07/2020   Preop cardiovascular exam 07/17/2020   SOB (shortness of breath) on exertion 07/17/2020   Leg weakness, bilateral 07/13/2020   Aortic atherosclerosis (HCC) 07/09/2020   Body mass index (BMI) 34.0-34.9, adult 12/25/2019   Myalgia 10/30/2019   Lumbar post-laminectomy syndrome 10/24/2019   PAD (peripheral artery disease) (HCC) 06/06/2019   CKD (chronic kidney disease) stage 3, GFR 30-59 ml/min (HCC) 04/29/2019   B12 deficiency 01/23/2019   Left arm numbness 02/28/2018   Neck pain 02/28/2018   Macrocytic anemia 02/02/2017   DDD (degenerative disc disease), cervical 02/02/2017   Hypothyroid 02/02/2017   MRSA (methicillin resistant staph aureus) culture positive 02/02/2017   Nocturnal hypoxia 02/02/2017   Senile purpura (HCC) 10/03/2016   Essential hypertension, benign 09/16/2016   Bilateral carotid artery disease (HCC) 09/16/2016   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/18/2016   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   History of prostate cancer 12/21/2015   Left carpal tunnel syndrome 11/11/2015   Kidney stone on left side 07/05/2015   Myasthenia gravis (HCC) 05/26/2015   Radiculitis 01/05/2015   Long-term use of high-risk medication 10/15/2014   Persistent cough 09/10/2014   Pure hypercholesterolemia 07/25/2014   Spinal stenosis, lumbar region, with neurogenic claudication 02/24/2014   Lumbosacral stenosis with neurogenic claudication 02/24/2014   COPD (chronic obstructive pulmonary disease) (HCC) 01/22/2014   PCP:  Francisco Ophelia JINNY DOUGLAS, MD Pharmacy:   [name] [name], [place] - [address] AT [facility] [address] [place] [address] Phone: [phone] Fax: [phone]     Social Drivers of Health (SDOH) Social History: SDOH Screenings   Food Insecurity: No Food Insecurity (05/17/2024)  Housing: Unknown (05/17/2024)  Transportation Needs: Unmet Transportation Needs (05/17/2024)  Utilities: Not At Risk (05/17/2024)  Depression (PHQ2-9): Low Risk  (08/30/2021)  Financial Resource Strain: Low Risk  (04/30/2024)   Received from [name]  Social Connections: Unknown (05/17/2024)  Tobacco Use: Medium Risk (05/17/2024)   SDOH Interventions:     Readmission Risk Interventions     No data to display

## 2024-05-19 NOTE — Progress Notes (Signed)
 PROGRESS NOTE    Francisco [name]  FMW:[id number] DOB: 12-Mar-1946 DOA: 05/17/2024 PCP: Fernande Ophelia JINNY DOUGLAS, MD    Brief Narrative:  78 y.o. male with medical history significant of SVT, chronic atrial fibrillation, chronic anticoagulation Eliquis , Parkinson disease on Sinemet , hypertension, hyperlipidemia, COPD, peripheral vascular disease, diastolic congestive heart failure, history of CVA, hypothyroidism, bilateral carotid artery stenosis, myasthenia gravis who presents with frequent falls.   The patient states that he has had no recent changes in medication.  He has noted his bilateral lower extremities to be increasingly swollen.  His dose of torsemide  was just increased by PCP.  Patient presented to the emergency room after 4 falls in the 24 hours and resultant severe pain in the left hip.   Patient endorses ongoing or progressive issues with redness and edema lower extremities associated with pain.  Has been progressively worsening.  Patient is anticoagulant on Eliquis .  Reports that his legs gave out on him.  Does not report loss of consciousness.  Does not report head trauma.   Imaging survey and emergency room overall reassuring.  No evidence of bleed.  No evidence of fracture on imaging of the hip.  Laboratory vesication significant for AKI on CKD.   On my evaluation patient is resting in bed.  Son at bedside.  Patient appears visibly in pain.  He does answer all questions appropriately.  Vital signs reassuring at time of my evaluation.   Assessment & Plan:   Principal Problem:   Frequent falls  Acute on chronic diastolic congestive heart failure Difficult to assess volume status given overall body habitus but patient does have edema on the lower extremities and elevated BNP higher than normal Plan: Continue Lasix  40 mg IV daily Continue home Aldactone  Continue home beta-blocker Strict ins and outs, daily weights Fluid restricted diet Target net -1-1.5 L daily, diuresing  appropriately  Intractable back pain Low back, new lumbar spine.  X-ray imaging survey negative for fracture or dislocation of left hip.  Pain starts in lower spine and radiates down to left knee.  Strongly suspect sciatica.  Bilateral lumbar nerve impingement noted on MRI of lumbar spine Plan: Aggressive multimodal pain control IV Decadron  40 every 6   Bilateral lower extremity edema Superimposed cellulitis Chronic lymphedema Etiology of bilateral lower extremity edema is somewhat unclear.  Legs are erythematous and tender to touch which speaks more towards a cellulitic process.  However patient does not have a white count or fever to suggest underlying sepsis.  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   Again volume status difficult to ascertain.   Does not appear dry Diuresis as above Sodium level improved    Paroxysmal atrial fibrillation History of SVT Heart rate currently controlled, currently junctional rhythm Plan: Continue home Lopressor  Continue home Eliquis    History of myasthenia gravis Home prednisone  on hold IV Decadron  for now Continue home Imuran    Bilateral carotid artery stenosis Previously on Plavix , however this appears to have been discontinued Continue home Eliquis  High intensity statin   Hypothyroidism Continue Synthroid    Parkinson disease Appears stable Continue home Sinemet    COPD Stable.  On room air.  No wheezing   Essential hypertension Continue home regimen   Hyperlipidemia Continue high  intensity statin   Obesity BMI 35.  Complicates overall care and prognosis   DVT prophylaxis: Apixaban  Code  Status: DNR Family Communication: Son via phone 9/6 Disposition Plan: Status is: Inpatient Remains inpatient appropriate because: Intractable back pain   Level of care: Telemetry Medical  Consultants:  None  Procedures:  None  Antimicrobials: Ancef    Subjective: Seen and examined.  Back pain improved.  Objective: Vitals:   05/19/24 0415 05/19/24 0513 05/19/24 0814 05/19/24 1410  BP:   (!) 112/54 (!) 92/48  Pulse:   (!) 42 (!) 49  Resp: 17 18 20 20   Temp:   98.9 F (37.2 C)   TempSrc:      SpO2:   99% (!) 87%  Weight:  109 kg    Height:        Intake/Output Summary (Last 24 hours) at 05/19/2024 1420 Last data filed at 05/19/2024 0900 Gross per 24 hour  Intake 1080 ml  Output 350 ml  Net 730 ml   Filed Weights   05/18/24 0131 05/19/24 0238 05/19/24 0513  Weight: 112.8 kg 113.2 kg 109 kg    Examination:  General exam: Appears uncomfortable Respiratory system: Lungs clear.  Normal work of breathing.  Room air Cardiovascular system: 1 S2, RRR, no murmurs, 1+ pedal edema BLE Gastrointestinal system: Obese, soft, NT/ND, normal bowel sounds Central nervous system: Alert and oriented. No focal neurological deficits. Extremities: Left lower extremity.  Decreased range of motion Skin: Bilateral lower extremities erythematous, tender to touch Psychiatry: Judgement and insight appear normal. Mood & affect appropriate.     Data Reviewed: I have personally reviewed following labs and imaging studies  CBC: Recent Labs  Lab 05/17/24 0951 05/18/24 0417 05/19/24 0846  WBC 6.8 5.2 6.6  NEUTROABS 4.9  --  4.5  HGB 9.4* 8.7* 9.3*  HCT 30.4* 28.2* 29.6*  MCV 113.0* 112.8* 110.9*  PLT 237 202 193   Basic Metabolic Panel: Recent Labs  Lab 05/17/24 0951 05/18/24 0417 05/19/24 0846  NA 146* 140 137  K 4.0 3.9 4.1  CL 99 98 96*  CO2 32 33* 28  GLUCOSE 91 80 97  BUN 47* 43* 40*  CREATININE 1.99* 1.82* 2.02*  CALCIUM  9.1 8.3* 8.3*  MG 2.2  --   --     GFR: Estimated Creatinine Clearance: 36.7 mL/min (A) (by C-G formula based on SCr of 2.02 mg/dL (H)). Liver Function Tests: Recent Labs  Lab 05/17/24 0951  AST 16  ALT 7  ALKPHOS 52  BILITOT 1.0  PROT 6.5  ALBUMIN  3.6   No results for input(s): LIPASE, AMYLASE in the last 168 hours. No results for input(s): AMMONIA in the last 168 hours. Coagulation Profile: No results for input(s): INR, PROTIME in the last 168 hours. Cardiac Enzymes: No results for input(s): CKTOTAL, CKMB, CKMBINDEX, TROPONINI in the last 168 hours. BNP (last 3 results) No results for input(s): PROBNP in the last 8760 hours. HbA1C: No results for input(s): HGBA1C in the last 72 hours. CBG: No results for input(s): GLUCAP in the last 168 hours. Lipid Profile: No results for input(s): CHOL, HDL, LDLCALC, TRIG, CHOLHDL, LDLDIRECT in the last 72 hours. Thyroid  Function Tests: No results for input(s): TSH, T4TOTAL, FREET4, T3FREE, THYROIDAB in the last 72 hours. Anemia Panel: No results for input(s): VITAMINB12, FOLATE, FERRITIN, TIBC, IRON , RETICCTPCT in the last 72 hours. Sepsis Labs: No results for input(s): PROCALCITON, LATICACIDVEN in the last 168 hours.  No results found for this or any previous visit (from  the past 240 hours).       Radiology Studies: MR Lumbar Spine W Wo Contrast Result Date: 05/18/2024 CLINICAL DATA:  Lumbar radiculopathy, trauma. Multiple recent falls. Left hip pain. History of lumbar surgery. EXAM: MRI LUMBAR SPINE WITHOUT AND WITH CONTRAST TECHNIQUE: Multiplanar and multiecho pulse sequences of the lumbar spine were obtained without and with intravenous contrast. CONTRAST:  10mL GADAVIST  GADOBUTROL  1 MMOL/ML IV SOLN COMPARISON:  Lumbar spine MRI 03/10/2021 FINDINGS: The study is motion degraded, most notably on axial sequences including severe motion on the axial T2 sequence. Segmentation: Standard. Alignment:   Unchanged grade 1 retrolisthesis of L5 on S1. Vertebrae: No fracture or suspicious marrow lesion. New degenerative endplate changes at L4-5 including mild degenerative edema. Conus medullaris and cauda equina: Conus extends to the L2 level and is normal in signal. Limited assessment of the cauda equina due to motion. Paraspinal and other soft tissues: Postoperative changes in the posterior lumbar soft tissues. Bilateral renal atrophy. Disc levels: Disc desiccation throughout the lumbar spine with mild disc space narrowing at L1-2 and L2-3, moderate narrowing at L3-4, and moderate to severe narrowing at L4-5 and L5-S1. T12-L1: Mild disc bulging without evidence of significant stenosis. L1-2: Disc bulging, prominent epidural fat, and mild facet hypertrophy result in mild spinal stenosis without significant neural foraminal stenosis, similar to prior. L2-3: Interval laminectomies. Disc bulging and moderate facet hypertrophy without evidence of significant stenosis. L3-4: Previous laminectomies. Disc bulging, a right paracentral to subarticular disc extrusion with mild cephalad migration, bilateral foraminal disc protrusions, and moderate facet hypertrophy result in mild-to-moderate spinal stenosis, moderate to severe right and mild-to-moderate left lateral recess stenosis, and severe bilateral neural foraminal stenosis, similar to prior. Potential bilateral L3 and right L4 nerve root impingement. L4-5: Previous laminectomies. Circumferential disc bulging, a left foraminal disc extrusion, and severe facet hypertrophy result in mild spinal stenosis, likely moderate bilateral lateral recess stenosis, and severe left greater than right neural foraminal stenosis, mildly progressed. Potential bilateral L4 and L5 nerve root impingement. L5-S1: Previous laminectomies. Circumferential disc bulging and moderate to severe facet hypertrophy result in mild-to-moderate bilateral lateral recess stenosis and severe bilateral neural  foraminal stenosis without significant spinal stenosis, similar to prior. Potential bilateral L5 nerve root impingement. IMPRESSION: 1. Motion degraded examination. 2. Interval L2-3 posterior decompression without significant residual stenosis. 3. Progressive disc degeneration at L4-5 with mild spinal stenosis and severe bilateral neural foraminal stenosis. 4. Unchanged severe bilateral neural foraminal stenosis at L3-4 and L5-S1. Electronically Signed   By: Dasie Hamburg M.D.   On: 05/18/2024 19:30        Scheduled Meds:  amiodarone   200 mg Oral Daily   apixaban   5 mg Oral BID   azaTHIOprine   150 mg Oral Daily   carbidopa -levodopa   1 tablet Oral TID   cholecalciferol   2,000 Units Oral Daily   cyanocobalamin   1,000 mcg Oral Daily   dexamethasone  (DECADRON ) injection  4 mg Intravenous Q6H   furosemide   40 mg Intravenous Daily   levothyroxine   50 mcg Oral Q0600   lidocaine   1 patch Transdermal Q24H   methocarbamol  (ROBAXIN ) injection  1,000 mg Intravenous Q8H   potassium chloride   10 mEq Oral Daily   pregabalin   100 mg Oral BID   spironolactone   25 mg Oral Daily   Continuous Infusions:   ceFAZolin  (ANCEF ) IV 2 g (05/19/24 1320)     LOS: 2 days    Calvin KATHEE Robson, MD [facility]   If 7PM-7AM, please contact night-coverage  05/19/2024, 2:20 PM

## 2024-05-19 NOTE — NC FL2 (Signed)
 [place]  MEDICAID [id number] LEVEL OF CARE FORM     IDENTIFICATION  Patient Name: Francisco [name] Birthdate: [birth year] Sex: male Admission Date (Current Location): 05/17/2024  [facility] and [place] Number:  Chiropodist and Address:  [facility], [address], [place], [place] [address]      Provider Number: [id number]  Attending Physician Name and Address:  Jhonny Calvin NOVAK, MD  Relative Name and Phone Number:  Rayland Hamed [phone]    Current Level of Care: Hospital Recommended Level of Care: Skilled Nursing Facility Prior Approval Number:    Date Approved/Denied: 02/29/24 PASRR Number: [id number]  Discharge Plan: SNF    Current Diagnoses: Patient Active Problem List   Diagnosis Date Noted   Frequent falls 05/17/2024   Acute on chronic diastolic CHF (congestive heart failure) (HCC) 02/27/2024   Acute diastolic CHF (congestive heart failure) (HCC) 02/27/2024   Cellulitis 02/27/2024   Stroke (HCC) 05/31/2023   Obesity (BMI 30-39.9) 05/31/2023   HLD (hyperlipidemia) 05/31/2023   Chronic diastolic CHF (congestive heart failure) (HCC) 05/31/2023   Hyperkalemia 05/31/2023   SVT (supraventricular tachycardia) (HCC) 12/30/2021   Hypothyroidism 12/30/2021   Parkinson disease (HCC) 12/30/2021   Elevated troponin 12/30/2021   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   Acquired thrombophilia (HCC) 01/13/2021   History of decompression of median nerve 01/01/2021   Paroxysmal atrial fibrillation (HCC) 10/06/2020   Carotid stenosis, right 09/17/2020   S/P cervical spinal fusion    Leukocytosis    Essential hypertension    Anemia of chronic disease    Postoperative pain    Neuropathic pain    Cervical myelopathy (HCC) 08/07/2020   Preop cardiovascular exam 07/17/2020   SOB (shortness of breath) on exertion 07/17/2020   Leg weakness, bilateral 07/13/2020   Aortic atherosclerosis (HCC) 07/09/2020   Body mass index  (BMI) 34.0-34.9, adult 12/25/2019   Myalgia 10/30/2019   Lumbar post-laminectomy syndrome 10/24/2019   PAD (peripheral artery disease) (HCC) 06/06/2019   CKD (chronic kidney disease) stage 3, GFR 30-59 ml/min (HCC) 04/29/2019   B12 deficiency 01/23/2019   Left arm numbness 02/28/2018   Neck pain 02/28/2018   Macrocytic anemia 02/02/2017   DDD (degenerative disc disease), cervical 02/02/2017   Hypothyroid 02/02/2017   MRSA (methicillin resistant staph aureus) culture positive 02/02/2017   Nocturnal hypoxia 02/02/2017   Senile purpura (HCC) 10/03/2016   Essential hypertension, benign 09/16/2016   Bilateral carotid artery disease (HCC) 09/16/2016   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/18/2016   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   History of prostate cancer 12/21/2015   Left carpal tunnel syndrome 11/11/2015   Kidney stone on left side 07/05/2015   Myasthenia gravis (HCC) 05/26/2015   Radiculitis 01/05/2015   Long-term use of high-risk medication 10/15/2014   Persistent cough 09/10/2014   Pure hypercholesterolemia 07/25/2014   Spinal stenosis, lumbar region, with neurogenic claudication 02/24/2014   Lumbosacral stenosis with neurogenic claudication 02/24/2014   COPD (chronic obstructive pulmonary disease) (HCC) 01/22/2014    Orientation RESPIRATION BLADDER Height & Weight     Self, Time, Situation, Place  Normal, Other (Comment) (Diminished Bilateral breath sounds.) External catheter Weight: 240 lb 4.8 oz (109 kg) Height:  5' 10 (177.8 cm)  BEHAVIORAL SYMPTOMS/MOOD NEUROLOGICAL BOWEL NUTRITION STATUS     (Parkinson's disease) Continent Diet (Diet Heart Fluid consistency: Thin; Fluid restriction: 1800 mL Fluid: Cardiac starting at 09/05 1534)  AMBULATORY STATUS COMMUNICATION OF NEEDS Skin   Extensive Assist Verbally Other (Comment) (Bilateral lower  extremities erythematous, tender to touch)                       Personal Care Assistance Level of Assistance  Bathing,  Feeding, Dressing Bathing Assistance: Maximum assistance Feeding assistance: Limited assistance (Modified) Dressing Assistance: Maximum assistance (lower extremities)     Functional Limitations Info  Sight, Hearing, Speech Sight Info:  (Wears glasses)        SPECIAL CARE FACTORS FREQUENCY  PT (By licensed PT), OT (By licensed OT)     PT Frequency: 2x OT Frequency: 2x            Contractures Contractures Info: Not present    Additional Factors Info  Allergies, Code Status Code Status Info: DNR-Interventions Allergies Info: Azithromycin; Codeine           Current Medications (05/19/2024):  This is the current hospital active medication list Current Facility-Administered Medications  Medication Dose Route Frequency Provider Last Rate Last Admin   acetaminophen  (TYLENOL ) tablet 650 mg  650 mg Oral Q6H PRN Jhonny Sahara B, MD       Or   acetaminophen  (TYLENOL ) suppository 650 mg  650 mg Rectal Q6H PRN Sreenath, Sudheer B, MD       albuterol  (PROVENTIL ) (2.5 MG/3ML) 0.083% nebulizer solution 2.5 mg  2.5 mg Nebulization Q2H PRN Sreenath, Sudheer B, MD       amiodarone  (PACERONE ) tablet 200 mg  200 mg Oral Daily Sreenath, Sudheer B, MD   200 mg at 05/19/24 9157   apixaban  (ELIQUIS ) tablet 5 mg  5 mg Oral BID Sreenath, Sudheer B, MD   5 mg at 05/19/24 9157   azaTHIOprine  (IMURAN ) tablet 150 mg  150 mg Oral Daily Sreenath, Sudheer B, MD   150 mg at 05/19/24 0841   bisacodyl  (DULCOLAX) EC tablet 10 mg  10 mg Oral QHS PRN Sreenath, Sudheer B, MD       bisacodyl  (DULCOLAX) suppository 10 mg  10 mg Rectal Daily PRN Jhonny Sahara B, MD       carbidopa -levodopa  (SINEMET  IR) 25-100 MG per tablet immediate release 1 tablet  1 tablet Oral TID Sreenath, Sudheer B, MD   1 tablet at 05/19/24 0843   ceFAZolin  (ANCEF ) IVPB 2g/100 mL premix  2 g Intravenous Q8H Gilchrist, Tiffany N, RPH 200 mL/hr at 05/19/24 0548 2 g at 05/19/24 0548   cholecalciferol  (VITAMIN D3) tablet 2,000 Units   2,000 Units Oral Daily Sreenath, Sudheer B, MD   2,000 Units at 05/19/24 9156   cyanocobalamin  (VITAMIN B12) tablet 1,000 mcg  1,000 mcg Oral Daily Sreenath, Sudheer B, MD   1,000 mcg at 05/19/24 9157   dexamethasone  (DECADRON ) injection 4 mg  4 mg Intravenous Q6H Sreenath, Sudheer B, MD       fentaNYL  (SUBLIMAZE ) injection 25 mcg  25 mcg Intravenous Q3H PRN Sreenath, Sudheer B, MD   25 mcg at 05/19/24 0841   furosemide  (LASIX ) injection 40 mg  40 mg Intravenous Daily Sreenath, Sudheer B, MD   40 mg at 05/19/24 0841   hydrALAZINE  (APRESOLINE ) injection 10 mg  10 mg Intravenous Q6H PRN Sreenath, Sudheer B, MD       HYDROmorphone  (DILAUDID ) injection 1 mg  1 mg Intravenous Q2H PRN Sreenath, Sudheer B, MD       levothyroxine  (SYNTHROID ) tablet 50 mcg  50 mcg Oral Q0600 Sreenath, Sudheer B, MD   50 mcg at 05/19/24 0545   lidocaine  (LIDODERM ) 5 % 1 patch  1 patch Transdermal Q24H  Jhonny Sahara B, MD   1 patch at 05/18/24 1642   meclizine  (ANTIVERT ) tablet 25 mg  25 mg Oral TID PRN Sreenath, Sudheer B, MD       methocarbamol  (ROBAXIN ) injection 1,000 mg  1,000 mg Intravenous Q8H Sreenath, Sudheer B, MD   1,000 mg at 05/19/24 0545   ondansetron  (ZOFRAN ) tablet 4 mg  4 mg Oral Q6H PRN Sreenath, Sudheer B, MD       Or   ondansetron  (ZOFRAN ) injection 4 mg  4 mg Intravenous Q6H PRN Sreenath, Sudheer B, MD       oxyCODONE  (Oxy IR/ROXICODONE ) immediate release tablet 10-15 mg  10-15 mg Oral Q4H PRN Sreenath, Sudheer B, MD   10 mg at 05/19/24 0701   potassium chloride  (KLOR-CON  M) CR tablet 10 mEq  10 mEq Oral Daily Sreenath, Sudheer B, MD   10 mEq at 05/19/24 9157   pregabalin  (LYRICA ) capsule 100 mg  100 mg Oral BID Sreenath, Sudheer B, MD   100 mg at 05/19/24 9157   spironolactone  (ALDACTONE ) tablet 25 mg  25 mg Oral Daily Sreenath, Sudheer B, MD   25 mg at 05/19/24 9157     Discharge Medications: Please see discharge summary for a list of discharge medications.  Relevant Imaging  Results:  Relevant Lab Results:   Additional Information [id number]  [place] L Paralee Pendergrass, LCSW

## 2024-05-19 NOTE — Plan of Care (Signed)
   Problem: Education: Goal: Knowledge of General Education information will improve Description Including pain rating scale, medication(s)/side effects and non-pharmacologic comfort measures Outcome: Progressing

## 2024-05-20 DIAGNOSIS — R296 Repeated falls: Secondary | ICD-10-CM | POA: Diagnosis not present

## 2024-05-20 LAB — CBC WITH DIFFERENTIAL/PLATELET
Abs Immature Granulocytes: 0.04 K/uL (ref 0.00–0.07)
Basophils Absolute: 0 K/uL (ref 0.0–0.1)
Basophils Relative: 0 %
Eosinophils Absolute: 0 K/uL (ref 0.0–0.5)
Eosinophils Relative: 0 %
HCT: 31.2 % — ABNORMAL LOW (ref 39.0–52.0)
Hemoglobin: 9.8 g/dL — ABNORMAL LOW (ref 13.0–17.0)
Immature Granulocytes: 1 %
Lymphocytes Relative: 5 %
Lymphs Abs: 0.2 K/uL — ABNORMAL LOW (ref 0.7–4.0)
MCH: 34.9 pg — ABNORMAL HIGH (ref 26.0–34.0)
MCHC: 31.4 g/dL (ref 30.0–36.0)
MCV: 111 fL — ABNORMAL HIGH (ref 80.0–100.0)
Monocytes Absolute: 0.2 K/uL (ref 0.1–1.0)
Monocytes Relative: 5 %
Neutro Abs: 4.1 K/uL (ref 1.7–7.7)
Neutrophils Relative %: 89 %
Platelets: 190 K/uL (ref 150–400)
RBC: 2.81 MIL/uL — ABNORMAL LOW (ref 4.22–5.81)
RDW: 15.7 % — ABNORMAL HIGH (ref 11.5–15.5)
Smear Review: NORMAL
WBC: 4.5 K/uL (ref 4.0–10.5)
nRBC: 1.5 % — ABNORMAL HIGH (ref 0.0–0.2)

## 2024-05-20 LAB — BASIC METABOLIC PANEL WITH GFR
Anion gap: 11 (ref 5–15)
BUN: 45 mg/dL — ABNORMAL HIGH (ref 8–23)
CO2: 30 mmol/L (ref 22–32)
Calcium: 8.8 mg/dL — ABNORMAL LOW (ref 8.9–10.3)
Chloride: 97 mmol/L — ABNORMAL LOW (ref 98–111)
Creatinine, Ser: 1.87 mg/dL — ABNORMAL HIGH (ref 0.61–1.24)
GFR, Estimated: 36 mL/min — ABNORMAL LOW (ref >60)
Glucose, Bld: 137 mg/dL — ABNORMAL HIGH (ref 70–99)
Potassium: 4.4 mmol/L (ref 3.5–5.1)
Sodium: 138 mmol/L (ref 135–145)

## 2024-05-20 MED ORDER — CEPHALEXIN 500 MG PO CAPS
500.0000 mg | ORAL_CAPSULE | Freq: Three times a day (TID) | ORAL | Status: DC
  Administered 2024-05-20 – 2024-05-22 (×7): 500 mg via ORAL
  Filled 2024-05-20 (×7): qty 1

## 2024-05-20 MED ORDER — ORAL CARE MOUTH RINSE: 15.0000 mL | OROMUCOSAL | Status: DC | PRN

## 2024-05-20 MED ORDER — BUMETANIDE 1 MG PO TABS
2.0000 mg | ORAL_TABLET | Freq: Every day | ORAL | Status: DC
Start: 2024-05-20 — End: 2024-05-23
  Administered 2024-05-20 – 2024-05-22 (×3): 2 mg via ORAL
  Filled 2024-05-20 (×3): qty 2

## 2024-05-20 NOTE — Care Management Important Message (Signed)
 Important Message  Patient Details  Name: Francisco [name] MRN: [id number] Date of Birth: 23-Nov-1945   Important Message Given:  Yes - Medicare IM     Francisco [name] 05/20/2024, 3:03 PM

## 2024-05-20 NOTE — Plan of Care (Signed)
 Pain has greatly improved with steroids.  Up to chair for about 4 hrs today.  Was very heavy assist to get back to bed.  Very weak when standing.  Vital remained stable.

## 2024-05-20 NOTE — Progress Notes (Signed)
 Physical Therapy Treatment Patient Details Name: Francisco [name] MRN: [id number] DOB: 04-30-1946 Today's Date: 05/20/2024   History of Present Illness Francisco [name] is a 78 y.o. male with medical history significant of SVT, chronic atrial fibrillation, chronic anticoagulation Eliquis , Parkinson disease on Sinemet , hypertension, hyperlipidemia, COPD, peripheral vascular disease, diastolic congestive heart failure, history of CVA, hypothyroidism, bilateral carotid artery stenosis, myasthenia gravis who presents with frequent falls. The patient states that he has had no recent changes in medication.  He has noted his bilateral lower extremities to be increasingly swollen. His dose of torsemide  was just increased by PCP. Patient presented to the emergency room after 4 falls in the 24 hours and resultant severe pain in the left hip.    PT Comments  Pt received in bed for co-tx with OT in order to safely progress functional mobility requiring +2 assist. Pt pre-medicated prior to session with improved tolerance for OOB>chair despite stating 8/10 L LE pain upon standing. Pt required +2 assist to stand from raised bed requiring less assist with support of RW than without. ModA of 2 to safely wt shift towards Right side and lower to sitting. Improved ability to transfer this date, however not at functional baseline and will benefit from STR prior to returning to ALF.   If plan is discharge home, recommend the following: Two people to help with walking and/or transfers;Two people to help with bathing/dressing/bathroom;Help with stairs or ramp for entrance;Assist for transportation;Assistance with cooking/housework   Can travel by private vehicle     No  Equipment Recommendations  Other (comment) (TBD at next level of care)    Recommendations for Other Services       Precautions / Restrictions Precautions Precautions: Fall Restrictions Weight Bearing Restrictions Per Provider Order: No      Mobility  Bed Mobility Overal bed mobility: Needs Assistance Bed Mobility: Supine to Sit     Supine to sit: Mod assist, HOB elevated     General bed mobility comments: assistance at trunk and legs    Transfers Overall transfer level: Needs assistance Equipment used: Rolling walker (2 wheels) Transfers: Sit to/from Stand, Bed to chair/wheelchair/BSC Sit to Stand: Mod assist, +2 physical assistance   Step pivot transfers: Mod assist, +2 physical assistance, From elevated surface       General transfer comment: Attempted to stand from raised bed without assistive device, requiring Mod/Max of 2, unable to progress to bedside chair due to unsteadiness and L LE pain without using RW    Ambulation/Gait               General Gait Details: pt does not walk at baseline   Stairs             Wheelchair Mobility     Tilt Bed    Modified Rankin (Stroke Patients Only)       Balance                                            Communication Communication Communication: No apparent difficulties  Cognition Arousal: Alert Behavior During Therapy: WFL for tasks assessed/performed   PT - Cognitive impairments: No apparent impairments                         Following commands: Intact      Cueing Cueing Techniques: Verbal cues, Visual  cues  Exercises Other Exercises Other Exercises: pt educated on positioning to prevent sciatic nerve tension, to avoid L-shaped positions with the body and flex the knee for improved comfort.    General Comments General comments (skin integrity, edema, etc.):  (R LE edema noted, MD aware, Right IV site no longer intact, nursing in to address)      Pertinent Vitals/Pain Pain Assessment Pain Assessment: 0-10 Pain Score: 8  Pain Location: L LE Pain Descriptors / Indicators: Discomfort, Grimacing, Aching Pain Intervention(s): Premedicated before session    Home Living                           Prior Function            PT Goals (current goals can now be found in the care plan section) Acute Rehab PT Goals Patient Stated Goal: feel better and return home Progress towards PT goals: Progressing toward goals    Frequency    Min 2X/week      PT Plan      Co-evaluation PT/OT/SLP Co-Evaluation/Treatment: Yes Reason for Co-Treatment: Complexity of the patient's impairments (multi-system involvement);For patient/therapist safety PT goals addressed during session: Mobility/safety with mobility;Balance;Proper use of DME        AM-PAC PT 6 Clicks Mobility   Outcome Measure  Help needed turning from your back to your side while in a flat bed without using bedrails?: A Lot Help needed moving from lying on your back to sitting on the side of a flat bed without using bedrails?: A Lot Help needed moving to and from a bed to a chair (including a wheelchair)?: A Lot Help needed standing up from a chair using your arms (e.g., wheelchair or bedside chair)?: A Lot Help needed to walk in hospital room?: Total Help needed climbing 3-5 steps with a railing? : Total 6 Click Score: 10    End of Session Equipment Utilized During Treatment: Gait belt Activity Tolerance: Patient tolerated treatment well Patient left: in chair;with call bell/phone within reach;with chair alarm set Nurse Communication: Mobility status PT Visit Diagnosis: Muscle weakness (generalized) (M62.81);Repeated falls (R29.6);Other abnormalities of gait and mobility (R26.89);Pain Pain - Right/Left: Left Pain - part of body: Hip;Knee     Time: 1100-1129 PT Time Calculation (min) (ACUTE ONLY): 29 min  Charges:    $Therapeutic Activity: 8-22 mins PT General Charges $$ ACUTE PT VISIT: 1 Visit                    Darice Bohr, PTA  Darice JAYSON Bohr 05/20/2024, 12:47 PM

## 2024-05-20 NOTE — Progress Notes (Signed)
   05/20/24 0815  Spiritual Encounters  Type of Visit Initial  Care provided to: Pt and family  Conversation partners present during encounter Other (comment) Training and development officer)  Referral source Patient request  Reason for visit Advance directives  OnCall Visit Yes   Chaplain visited patient and friend at bedside in response to a request for an AD.  Chaplain explained the document and shared the process for notary and witnesses.  Patient confirmed he understood.  Chaplain asked if there were any other spiritual care needs at this time and the patient said he didn't need anything else.    Rev. Rana M. Nicholaus, M.Div. Chaplain Resident [facility]

## 2024-05-20 NOTE — TOC Progression Note (Addendum)
 Transition of Care [facility]) - Progression Note    Patient Details  Name: Francisco [name] MRN: [id number] Date of Birth: [birth year]  [facility] CM/SW Contact  [name], RN Phone Number: 05/20/2024, 3:57 PM  Clinical Narrative:     Spoke with patient about bed offers He states that [name] would be his first preference [name] with [name] to confirm is patient is going to be required to pay any copays upfront     Update:  Per [name] at [facility] patient would be required to pay copays of $203 for 7 days in order to admit.  Patient in agreement.  Accepted bed in [facility].  Per MD patient not medically ready to start auth yet                  Expected Discharge Plan and Services                                               Social Drivers of Health (SDOH) Interventions SDOH Screenings   Food Insecurity: No Food Insecurity (05/17/2024)  Housing: Unknown (05/17/2024)  Transportation Needs: Unmet Transportation Needs (05/17/2024)  Utilities: Not At Risk (05/17/2024)  Depression (PHQ2-9): Low Risk  (08/30/2021)  Financial Resource Strain: Low Risk  (04/30/2024)   Received from [facility]  Social Connections: Unknown (05/17/2024)  Tobacco Use: Medium Risk (05/17/2024)    Readmission Risk Interventions     No data to display

## 2024-05-20 NOTE — Progress Notes (Signed)
 Occupational Therapy Treatment Patient Details Name: JAYME CHAM MRN: [id number] DOB: 08-26-1946 Today's Date: 05/20/2024   History of present illness EMANUELL MORINA is a 78 y.o. male with medical history significant of SVT, chronic atrial fibrillation, chronic anticoagulation Eliquis , Parkinson disease on Sinemet , hypertension, hyperlipidemia, COPD, peripheral vascular disease, diastolic congestive heart failure, history of CVA, hypothyroidism, bilateral carotid artery stenosis, myasthenia gravis who presents with frequent falls. The patient states that he has had no recent changes in medication.  He has noted his bilateral lower extremities to be increasingly swollen. His dose of torsemide  was just increased by PCP. Patient presented to the emergency room after 4 falls in the 24 hours and resultant severe pain in the left hip.   OT comments  Pt seen for OT/PT co-tx this date, pre-medicated prior to session. Continues to present below functional baseline as pt is requiring +2 assist for mobility and safe transfers (previously could transfer mod independent from bed to power wc/commode/recliner). Setup environment for home simulation to squat pivot to recliner, pt globally weak with increased LLE pain upon standing and ultimately requires MOD A +2 with RW to take small pivotal steps. High falls risk and requires cues for safety / sequencing throughout. OT will continue to follow acutely, discharge recommendation appropriate.       If plan is discharge home, recommend the following:  Help with stairs or ramp for entrance;Assistance with cooking/housework;Assist for transportation;Two people to help with walking and/or transfers;Two people to help with bathing/dressing/bathroom   Equipment Recommendations  None recommended by OT       Precautions / Restrictions Precautions Precautions: Fall Restrictions Weight Bearing Restrictions Per Provider Order: No       Mobility Bed  Mobility Overal bed mobility: Needs Assistance Bed Mobility: Supine to Sit     Supine to sit: Mod assist, HOB elevated     General bed mobility comments: assistance at trunk and legs    Transfers Overall transfer level: Needs assistance Equipment used: Rolling walker (2 wheels) Transfers: Sit to/from Stand, Bed to chair/wheelchair/BSC Sit to Stand: Mod assist, +2 physical assistance     Step pivot transfers: Mod assist, +2 physical assistance, From elevated surface     General transfer comment: environmental setup for lateral scoot transfer (pt transfers ind at baseline), pt unable to complete safely with face to face stand, RW used for step pivot to recliner. cues for upright posture, limited by increasing pain in back/LLE with movement. STSx2     Balance Overall balance assessment: Needs assistance Sitting-balance support: Bilateral upper extremity supported Sitting balance-Leahy Scale: Fair     Standing balance support: Bilateral upper extremity supported Standing balance-Leahy Scale: Poor Standing balance comment: reliant on external UE support, poor tolerance to standing                           ADL either performed or assessed with clinical judgement   ADL Overall ADL's : Needs assistance/impaired                                       General ADL Comments: +2 for safe transfer to recliner     Communication Communication Communication: No apparent difficulties   Cognition Arousal: Alert Behavior During Therapy: WFL for tasks assessed/performed Cognition: No apparent impairments  Following commands: Intact        Cueing   Cueing Techniques: Verbal cues, Visual cues        General Comments RLE edema, pt found on RA with [place] doffed in bed, O2 98% on RA, R IV site noted to be bleeding during session, RN called into assess. Left in care of RN end of session.    Pertinent Vitals/ Pain        Pain Assessment Pain Assessment: 0-10 Pain Score: 8  Pain Location: L LE Pain Descriptors / Indicators: Discomfort, Grimacing, Aching Pain Intervention(s): Premedicated before session, Repositioned, Monitored during session, Limited activity within patient's tolerance         Frequency  Min 2X/week        Progress Toward Goals  OT Goals(current goals can now be found in the care plan section)     Acute Rehab OT Goals OT Goal Formulation: With patient Time For Goal Achievement: 06/01/24 Potential to Achieve Goals: Good ADL Goals Pt Will Perform Lower Body Dressing: with min assist;sitting/lateral leans Pt Will Transfer to Toilet: with min assist;stand pivot transfer Additional ADL Goal #1: Pt will transfer bed<>WC with SUPV  Plan      Co-evaluation    PT/OT/SLP Co-Evaluation/Treatment: Yes Reason for Co-Treatment: Complexity of the patient's impairments (multi-system involvement);For patient/therapist safety PT goals addressed during session: Mobility/safety with mobility;Balance;Proper use of DME        AM-PAC OT 6 Clicks Daily Activity     Outcome Measure   Help from another person eating meals?: None Help from another person taking care of personal grooming?: A Little Help from another person toileting, which includes using toliet, bedpan, or urinal?: A Lot Help from another person bathing (including washing, rinsing, drying)?: A Lot Help from another person to put on and taking off regular upper body clothing?: A Little Help from another person to put on and taking off regular lower body clothing?: A Lot 6 Click Score: 16    End of Session Equipment Utilized During Treatment: Rolling walker (2 wheels);Gait belt  OT Visit Diagnosis: Unsteadiness on feet (R26.81);Other abnormalities of gait and mobility (R26.89);Repeated falls (R29.6);Muscle weakness (generalized) (M62.81);Pain Pain - Right/Left: Left Pain - part of body: Leg   Activity Tolerance  Patient tolerated treatment well   Patient Left in chair;with call bell/phone within reach;with nursing/sitter in room;with chair alarm set   Nurse Communication Mobility status;Need for lift equipment        Time: 1100-1129 OT Time Calculation (min): 29 min  Charges: OT General Charges $OT Visit: 1 Visit OT Treatments $Self Care/Home Management : 8-22 mins  Yaret Hush L. Dmarcus Decicco, OTR/L  05/20/24, 2:00 PM

## 2024-05-20 NOTE — Progress Notes (Signed)
 PROGRESS NOTE    Francisco [name]  FMW:[id number] DOB: Jan 02, 1946 DOA: 05/17/2024 PCP: Fernande Ophelia JINNY DOUGLAS, MD    Brief Narrative:  78 y.o. male with medical history significant of SVT, chronic atrial fibrillation, chronic anticoagulation Eliquis , Parkinson disease on Sinemet , hypertension, hyperlipidemia, COPD, peripheral vascular disease, diastolic congestive heart failure, history of CVA, hypothyroidism, bilateral carotid artery stenosis, myasthenia gravis who presents with frequent falls.   The patient states that he has had no recent changes in medication.  He has noted his bilateral lower extremities to be increasingly swollen.  His dose of torsemide  was just increased by PCP.  Patient presented to the emergency room after 4 falls in the 24 hours and resultant severe pain in the left hip.   Patient endorses ongoing or progressive issues with redness and edema lower extremities associated with pain.  Has been progressively worsening.  Patient is anticoagulant on Eliquis .  Reports that his legs gave out on him.  Does not report loss of consciousness.  Does not report head trauma.   Imaging survey and emergency room overall reassuring.  No evidence of bleed.  No evidence of fracture on imaging of the hip.  Laboratory vesication significant for AKI on CKD.   On my evaluation patient is resting in bed.  Son at bedside.  Patient appears visibly in pain.  He does answer all questions appropriately.  Vital signs reassuring at time of my evaluation.   Assessment & Plan:   Principal Problem:   Frequent falls  Acute on chronic diastolic congestive heart failure Difficult to assess volume status given overall body habitus but patient does have edema on the lower extremities and elevated BNP higher than normal.  Volume status approaching euvolemia Plan: DC IV Lasix  Resume home torsemide  Continue Aldactone  Continue home beta-blocker Strict ins and outs, daily weights Fluid restricted  diet Target net -1-1.5 L daily, diuresing appropriately  Intractable back pain Low back, new lumbar spine.  X-ray imaging survey negative for fracture or dislocation of left hip.  Pain starts in lower spine and radiates down to left knee.  Strongly suspect sciatica.  Bilateral lumbar nerve impingement noted on MRI of lumbar spine Plan: Aggressive multimodal pain control Continue IV Decadron  4 mg every 6 hours   Bilateral lower extremity edema Superimposed cellulitis Chronic lymphedema Etiology of bilateral lower extremity edema is somewhat unclear.  Legs are erythematous and tender to touch which speaks more towards a cellulitic process.  However patient does not have a white count or fever to suggest underlying sepsis.  Cellulitic changes improving Plan: P.o. diuresis as above DC Ancef  Start p.o. 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   Again volume status difficult to ascertain.   Does not appear dry Diuresis as above Sodium level improved    Paroxysmal atrial fibrillation History of SVT Heart rate currently controlled, currently junctional rhythm Plan: Continue home Lopressor  Continue home Eliquis    History of myasthenia gravis Home prednisone  on hold IV Decadron  for now Continue home Imuran    Bilateral carotid artery stenosis Previously on Plavix , however this appears to have been discontinued Continue home Eliquis  High intensity statin   Hypothyroidism Continue Synthroid    Parkinson disease Appears stable Continue home Sinemet    COPD Stable.  On room air.  No wheezing   Essential hypertension Continue  home regimen   Hyperlipidemia Continue high intensity statin   Obesity BMI 35.  Complicates overall care and  prognosis   DVT prophylaxis: Apixaban  Code Status: DNR Family Communication: Son via phone 9/6 Disposition Plan: Status is: Inpatient Remains inpatient appropriate because: Intractable back pain   Level of care: Telemetry Medical  Consultants:  None  Procedures:  None  Antimicrobials: Ancef    Subjective: Seen and examined.  Back pain appears to have improved.  Objective: Vitals:   05/20/24 0033 05/20/24 0244 05/20/24 0527 05/20/24 1405  BP: 106/68  (!) 124/58 (!) 130/52  Pulse: 82  (!) 52 (!) 58  Resp: 18  16 18   Temp: 97.7 F (36.5 C)  97.8 F (36.6 C) 97.8 F (36.6 C)  TempSrc: Oral  Oral Oral  SpO2: 97%  97% 98%  Weight:  113.8 kg    Height:        Intake/Output Summary (Last 24 hours) at 05/20/2024 1448 Last data filed at 05/20/2024 1400 Gross per 24 hour  Intake 940 ml  Output 1700 ml  Net -760 ml   Filed Weights   05/19/24 0238 05/19/24 0513 05/20/24 0244  Weight: 113.2 kg 109 kg 113.8 kg    Examination:  General exam: More comfortable appearing Respiratory system: Lungs clear.  Normal work of breathing.  Room air Cardiovascular system: 1 S2, RRR, no murmurs, trace pedal edema BLE Gastrointestinal system: Obese, soft, NT/ND, normal bowel sounds Central nervous system: Alert and oriented. No focal neurological deficits. Extremities: Swelling bilateral lower extremity.  Decreased range of motion Skin: Bilateral lower extremities erythematous, tender to touch Psychiatry: Judgement and insight appear normal. Mood & affect appropriate.     Data Reviewed: I have personally reviewed following labs and imaging studies  CBC: Recent Labs  Lab 05/17/24 0951 05/18/24 0417 05/19/24 0846 05/20/24 0942  WBC 6.8 5.2 6.6 4.5  NEUTROABS 4.9  --  4.5 4.1  HGB 9.4* 8.7* 9.3* 9.8*  HCT 30.4* 28.2* 29.6* 31.2*  MCV 113.0* 112.8* 110.9* 111.0*  PLT 237 202 193 190   Basic Metabolic Panel: Recent Labs  Lab 05/17/24 0951 05/18/24 0417 05/19/24 0846  05/20/24 0942  NA 146* 140 137 138  K 4.0 3.9 4.1 4.4  CL 99 98 96* 97*  CO2 32 33* 28 30  GLUCOSE 91 80 97 137*  BUN 47* 43* 40* 45*  CREATININE 1.99* 1.82* 2.02* 1.87*  CALCIUM  9.1 8.3* 8.3* 8.8*  MG 2.2  --   --   --    GFR: Estimated Creatinine Clearance: 40.5 mL/min (A) (by C-G formula based on SCr of 1.87 mg/dL (H)). Liver Function Tests: Recent Labs  Lab 05/17/24 0951  AST 16  ALT 7  ALKPHOS 52  BILITOT 1.0  PROT 6.5  ALBUMIN  3.6   No results for input(s): LIPASE, AMYLASE in the last 168 hours. No results for input(s): AMMONIA in the last 168 hours. Coagulation Profile: No results for input(s): INR, PROTIME in the last 168 hours. Cardiac Enzymes: No results for input(s): CKTOTAL, CKMB, CKMBINDEX, TROPONINI in the last 168 hours. BNP (last 3 results) No results for input(s): PROBNP in the last 8760 hours. HbA1C: No results for input(s): HGBA1C in the last 72 hours. CBG: No results for input(s): GLUCAP in the last 168 hours. Lipid Profile: No results for input(s): CHOL, HDL, LDLCALC, TRIG, CHOLHDL, LDLDIRECT in the last 72 hours. Thyroid  Function Tests: No results for input(s): TSH, T4TOTAL, FREET4, T3FREE, THYROIDAB in the last 72 hours. Anemia Panel: No  results for input(s): VITAMINB12, FOLATE, FERRITIN, TIBC, IRON , RETICCTPCT in the last 72 hours. Sepsis Labs: No results for input(s): PROCALCITON, LATICACIDVEN in the last 168 hours.  No results found for this or any previous visit (from the past 240 hours).       Radiology Studies: MR Lumbar Spine W Wo Contrast Result Date: 05/18/2024 CLINICAL DATA:  Lumbar radiculopathy, trauma. Multiple recent falls. Left hip pain. History of lumbar surgery. EXAM: MRI LUMBAR SPINE WITHOUT AND WITH CONTRAST TECHNIQUE: Multiplanar and multiecho pulse sequences of the lumbar spine were obtained without and with intravenous contrast. CONTRAST:  10mL GADAVIST   GADOBUTROL  1 MMOL/ML IV SOLN COMPARISON:  Lumbar spine MRI 03/10/2021 FINDINGS: The study is motion degraded, most notably on axial sequences including severe motion on the axial T2 sequence. Segmentation: Standard. Alignment:  Unchanged grade 1 retrolisthesis of L5 on S1. Vertebrae: No fracture or suspicious marrow lesion. New degenerative endplate changes at L4-5 including mild degenerative edema. Conus medullaris and cauda equina: Conus extends to the L2 level and is normal in signal. Limited assessment of the cauda equina due to motion. Paraspinal and other soft tissues: Postoperative changes in the posterior lumbar soft tissues. Bilateral renal atrophy. Disc levels: Disc desiccation throughout the lumbar spine with mild disc space narrowing at L1-2 and L2-3, moderate narrowing at L3-4, and moderate to severe narrowing at L4-5 and L5-S1. T12-L1: Mild disc bulging without evidence of significant stenosis. L1-2: Disc bulging, prominent epidural fat, and mild facet hypertrophy result in mild spinal stenosis without significant neural foraminal stenosis, similar to prior. L2-3: Interval laminectomies. Disc bulging and moderate facet hypertrophy without evidence of significant stenosis. L3-4: Previous laminectomies. Disc bulging, a right paracentral to subarticular disc extrusion with mild cephalad migration, bilateral foraminal disc protrusions, and moderate facet hypertrophy result in mild-to-moderate spinal stenosis, moderate to severe right and mild-to-moderate left lateral recess stenosis, and severe bilateral neural foraminal stenosis, similar to prior. Potential bilateral L3 and right L4 nerve root impingement. L4-5: Previous laminectomies. Circumferential disc bulging, a left foraminal disc extrusion, and severe facet hypertrophy result in mild spinal stenosis, likely moderate bilateral lateral recess stenosis, and severe left greater than right neural foraminal stenosis, mildly progressed. Potential  bilateral L4 and L5 nerve root impingement. L5-S1: Previous laminectomies. Circumferential disc bulging and moderate to severe facet hypertrophy result in mild-to-moderate bilateral lateral recess stenosis and severe bilateral neural foraminal stenosis without significant spinal stenosis, similar to prior. Potential bilateral L5 nerve root impingement. IMPRESSION: 1. Motion degraded examination. 2. Interval L2-3 posterior decompression without significant residual stenosis. 3. Progressive disc degeneration at L4-5 with mild spinal stenosis and severe bilateral neural foraminal stenosis. 4. Unchanged severe bilateral neural foraminal stenosis at L3-4 and L5-S1. Electronically Signed   By: Dasie Hamburg M.D.   On: 05/18/2024 19:30        Scheduled Meds:  amiodarone   200 mg Oral Daily   apixaban   5 mg Oral BID   azaTHIOprine   150 mg Oral Daily   bumetanide   2 mg Oral Daily   carbidopa -levodopa   1 tablet Oral TID   cephALEXin   500 mg Oral Q8H   cholecalciferol   2,000 Units Oral Daily   cyanocobalamin   1,000 mcg Oral Daily   dexamethasone  (DECADRON ) injection  4 mg Intravenous Q6H   levothyroxine   50 mcg Oral Q0600   lidocaine   1 patch Transdermal Q24H   methocarbamol  (ROBAXIN ) injection  1,000 mg Intravenous Q8H   potassium chloride   10 mEq Oral Daily   pregabalin   100 mg  Oral BID   spironolactone   25 mg Oral Daily   Continuous Infusions:     LOS: 3 days    Calvin KATHEE Robson, MD [facility]   If 7PM-7AM, please contact night-coverage  05/20/2024, 2:48 PM

## 2024-05-21 DIAGNOSIS — R296 Repeated falls: Secondary | ICD-10-CM | POA: Diagnosis not present

## 2024-05-21 LAB — CBC WITH DIFFERENTIAL/PLATELET
Abs Immature Granulocytes: 0.05 K/uL (ref 0.00–0.07)
Basophils Absolute: 0 K/uL (ref 0.0–0.1)
Basophils Relative: 0 %
Eosinophils Absolute: 0 K/uL (ref 0.0–0.5)
Eosinophils Relative: 0 %
HCT: 31.2 % — ABNORMAL LOW (ref 39.0–52.0)
Hemoglobin: 9.4 g/dL — ABNORMAL LOW (ref 13.0–17.0)
Immature Granulocytes: 1 %
Lymphocytes Relative: 4 %
Lymphs Abs: 0.2 K/uL — ABNORMAL LOW (ref 0.7–4.0)
MCH: 34.1 pg — ABNORMAL HIGH (ref 26.0–34.0)
MCHC: 30.1 g/dL (ref 30.0–36.0)
MCV: 113 fL — ABNORMAL HIGH (ref 80.0–100.0)
Monocytes Absolute: 0.4 K/uL (ref 0.1–1.0)
Monocytes Relative: 5 %
Neutro Abs: 5.9 K/uL (ref 1.7–7.7)
Neutrophils Relative %: 90 %
Platelets: 208 K/uL (ref 150–400)
RBC: 2.76 MIL/uL — ABNORMAL LOW (ref 4.22–5.81)
RDW: 15.9 % — ABNORMAL HIGH (ref 11.5–15.5)
Smear Review: NORMAL
WBC: 6.6 K/uL (ref 4.0–10.5)
nRBC: 0.5 % — ABNORMAL HIGH (ref 0.0–0.2)

## 2024-05-21 LAB — BASIC METABOLIC PANEL WITH GFR
Anion gap: 12 (ref 5–15)
BUN: 51 mg/dL — ABNORMAL HIGH (ref 8–23)
CO2: 28 mmol/L (ref 22–32)
Calcium: 8.7 mg/dL — ABNORMAL LOW (ref 8.9–10.3)
Chloride: 97 mmol/L — ABNORMAL LOW (ref 98–111)
Creatinine, Ser: 1.97 mg/dL — ABNORMAL HIGH (ref 0.61–1.24)
GFR, Estimated: 34 mL/min — ABNORMAL LOW (ref >60)
Glucose, Bld: 134 mg/dL — ABNORMAL HIGH (ref 70–99)
Potassium: 4.5 mmol/L (ref 3.5–5.1)
Sodium: 137 mmol/L (ref 135–145)

## 2024-05-21 MED ORDER — SENNOSIDES-DOCUSATE SODIUM 8.6-50 MG PO TABS
1.0000 | ORAL_TABLET | Freq: Two times a day (BID) | ORAL | Status: DC
  Administered 2024-05-21 – 2024-05-22 (×3): 1 via ORAL
  Filled 2024-05-21 (×3): qty 1

## 2024-05-21 MED ORDER — METHOCARBAMOL 500 MG PO TABS
750.0000 mg | ORAL_TABLET | Freq: Four times a day (QID) | ORAL | Status: DC
  Administered 2024-05-21 – 2024-05-22 (×6): 750 mg via ORAL
  Filled 2024-05-21 (×6): qty 2

## 2024-05-21 MED ORDER — DEXAMETHASONE 4 MG PO TABS: ORAL_TABLET | ORAL | Status: DC

## 2024-05-21 MED ORDER — POLYETHYLENE GLYCOL 3350 17 G PO PACK
17.0000 g | PACK | Freq: Every day | ORAL | Status: DC
  Administered 2024-05-21 – 2024-05-22 (×2): 17 g via ORAL
  Filled 2024-05-21 (×2): qty 1

## 2024-05-21 MED ORDER — BISACODYL 10 MG RE SUPP
10.0000 mg | Freq: Every day | RECTAL | Status: DC | PRN
  Administered 2024-05-22 (×2): 10 mg via RECTAL
  Filled 2024-05-21 (×2): qty 1

## 2024-05-21 MED ORDER — DEXAMETHASONE 4 MG PO TABS
4.0000 mg | ORAL_TABLET | Freq: Three times a day (TID) | ORAL | Status: DC
  Administered 2024-05-21 – 2024-05-22 (×4): 4 mg via ORAL
  Filled 2024-05-21 (×5): qty 1

## 2024-05-21 NOTE — Plan of Care (Signed)
 Pain much improved, PRN pain medication given X1 this far. Rested well overnight in NAD. No Bms overnight. Purewick in place for strict I & O.  Problem: Education: Goal: Knowledge of General Education information will improve Description: Including pain rating scale, medication(s)/side effects and non-pharmacologic comfort measures Outcome: Progressing   Problem: Health Behavior/Discharge Planning: Goal: Ability to manage health-related needs will improve Outcome: Progressing   Problem: Clinical Measurements: Goal: Ability to maintain clinical measurements within normal limits will improve Outcome: Progressing Goal: Will remain free from infection Outcome: Progressing Goal: Diagnostic test results will improve Outcome: Progressing Goal: Respiratory complications will improve Outcome: Progressing Goal: Cardiovascular complication will be avoided Outcome: Progressing   Problem: Activity: Goal: Risk for activity intolerance will decrease Outcome: Progressing   Problem: Nutrition: Goal: Adequate nutrition will be maintained Outcome: Progressing   Problem: Coping: Goal: Level of anxiety will decrease Outcome: Progressing   Problem: Elimination: Goal: Will not experience complications related to bowel motility Outcome: Progressing Goal: Will not experience complications related to urinary retention Outcome: Progressing   Problem: Pain Managment: Goal: General experience of comfort will improve and/or be controlled Outcome: Progressing   Problem: Safety: Goal: Ability to remain free from injury will improve Outcome: Progressing   Problem: Skin Integrity: Goal: Risk for impaired skin integrity will decrease Outcome: Progressing

## 2024-05-21 NOTE — Progress Notes (Signed)
 Physical Therapy Treatment Patient Details Name: Francisco [name] MRN: [id number] DOB: 12/08/1945 Today's Date: 05/21/2024   History of Present Illness Francisco [name] is a 78 y.o. male with medical history significant of SVT, chronic atrial fibrillation, chronic anticoagulation Eliquis , Parkinson disease on Sinemet , hypertension, hyperlipidemia, COPD, peripheral vascular disease, diastolic congestive heart failure, history of CVA, hypothyroidism, bilateral carotid artery stenosis, myasthenia gravis who presents with frequent falls. The patient states that he has had no recent changes in medication.  He has noted his bilateral lower extremities to be increasingly swollen. His dose of torsemide  was just increased by PCP. Patient presented to the emergency room after 4 falls in the 24 hours and resultant severe pain in the left hip.    PT Comments  Pt was pleasant and motivated to participate during the session and put forth good effort throughout. Pt education/training provided on log rolling with bed mobility tasks for decreased caregiver assistance.  Pt required min A with rolling and mod A with sidelying to/from sit with cues for sequencing.  Pt was able to perform multiple sit to/from stands from an elevated EOB with use of sara stedy per below with mostly min assist but was able to perform 1 or 2 sit to stands without physical assist.  Pt reported no adverse symptoms during the session with SpO2 and HR WNL throughout. Pt will benefit from continued PT services upon discharge to safely address deficits listed in patient problem list for decreased caregiver assistance and eventual return to PLOF.       If plan is discharge home, recommend the following: Two people to help with walking and/or transfers;Two people to help with bathing/dressing/bathroom;Help with stairs or ramp for entrance;Assist for transportation;Assistance with cooking/housework   Can travel by private vehicle     No   Equipment Recommendations       Recommendations for Other Services       Precautions / Restrictions Precautions Precautions: Fall Restrictions Weight Bearing Restrictions Per Provider Order: No     Mobility  Bed Mobility Overal bed mobility: Needs Assistance Bed Mobility: Rolling, Sidelying to Sit, Sit to Sidelying Rolling: Min assist Sidelying to sit: Mod assist     Sit to sidelying: Mod assist General bed mobility comments: Log roll training with min to mod verbal and tactile cues for sequencing and mod A for BLE and trunk control    Transfers Overall transfer level: Needs assistance   Transfers: Sit to/from Stand Sit to Stand: Min assist, From elevated surface           General transfer comment: Min A to come to standing using a sara steady from an elevated bed height position, verbal cues for sequencing with the lift and for controlled eccentric phase Transfer via Lift Equipment: Hydrographic surveyor  Ambulation/Gait               General Gait Details: pt does not walk at baseline   Stairs             Wheelchair Mobility     Tilt Bed    Modified Rankin (Stroke Patients Only)       Balance Overall balance assessment: Needs assistance Sitting-balance support: Feet supported Sitting balance-Leahy Scale: Good                                      Communication Communication Communication: No apparent difficulties  Cognition Arousal: Alert  Behavior During Therapy: WFL for tasks assessed/performed   PT - Cognitive impairments: No apparent impairments                         Following commands: Intact      Cueing Cueing Techniques: Verbal cues, Tactile cues  Exercises Other Exercises Other Exercises: sit to/from stands x 5 from bed using sara stedy Other Exercises: Static upright standing in sara steady 3 x 30-45 sec for improved activity tolerance Other Exercises: log roll training    General Comments         Pertinent Vitals/Pain Pain Assessment Pain Assessment: No/denies pain    Home Living                          Prior Function            PT Goals (current goals can now be found in the care plan section) Progress towards PT goals: Progressing toward goals    Frequency           PT Plan      Co-evaluation              AM-PAC PT 6 Clicks Mobility   Outcome Measure  Help needed turning from your back to your side while in a flat bed without using bedrails?: A Little Help needed moving from lying on your back to sitting on the side of a flat bed without using bedrails?: A Lot Help needed moving to and from a bed to a chair (including a wheelchair)?: A Lot Help needed standing up from a chair using your arms (e.g., wheelchair or bedside chair)?: A Lot Help needed to walk in hospital room?: Total Help needed climbing 3-5 steps with a railing? : Total 6 Click Score: 11    End of Session Equipment Utilized During Treatment: Gait belt Activity Tolerance: Patient tolerated treatment well Patient left: in bed;with call bell/phone within reach;with bed alarm set;Other (comment) (nursing requested pt be left in bed due to not having staffing to get pt back to bed from chair) Nurse Communication: Mobility status PT Visit Diagnosis: Muscle weakness (generalized) (M62.81);Repeated falls (R29.6);Other abnormalities of gait and mobility (R26.89);Pain     Time: 1420-1449 PT Time Calculation (min) (ACUTE ONLY): 29 min  Charges:    $Therapeutic Activity: 23-37 mins PT General Charges $$ ACUTE PT VISIT: 1 Visit                    D. Scott Conception Doebler PT, DPT 05/21/24, 3:07 PM

## 2024-05-21 NOTE — TOC Progression Note (Signed)
 Transition of Care [facility]) - Progression Note    Patient Details  Name: Francisco [name] MRN: [id number] Date of Birth: [birth year]  [facility] CM/SW Contact  [name], RN Phone Number: 05/21/2024, 9:52 AM  Clinical Narrative:     Per MD medically appropriate  for insurance auth to be initiated for [facility].  Requested [place] with [employer] to start auth                     Expected Discharge Plan and Services                                               Social Drivers of Health (SDOH) Interventions SDOH Screenings   Food Insecurity: No Food Insecurity (05/17/2024)  Housing: Unknown (05/17/2024)  Transportation Needs: Unmet Transportation Needs (05/17/2024)  Utilities: Not At Risk (05/17/2024)  Depression (PHQ2-9): Low Risk  (08/30/2021)  Financial Resource Strain: Low Risk  (04/30/2024)   Received from [facility]  Social Connections: Unknown (05/17/2024)  Tobacco Use: Medium Risk (05/17/2024)    Readmission Risk Interventions     No data to display

## 2024-05-21 NOTE — Progress Notes (Signed)
 PROGRESS NOTE    Francisco [name]  FMW:[id number] DOB: Jun 11, 1946 DOA: 05/17/2024 PCP: Fernande Ophelia JINNY DOUGLAS, MD    Brief Narrative:  78 y.o. male with medical history significant of SVT, chronic atrial fibrillation, chronic anticoagulation Eliquis , Parkinson disease on Sinemet , hypertension, hyperlipidemia, COPD, peripheral vascular disease, diastolic congestive heart failure, history of CVA, hypothyroidism, bilateral carotid artery stenosis, myasthenia gravis who presents with frequent falls.   The patient states that he has had no recent changes in medication.  He has noted his bilateral lower extremities to be increasingly swollen.  His dose of torsemide  was just increased by PCP.  Patient presented to the emergency room after 4 falls in the 24 hours and resultant severe pain in the left hip.   Patient endorses ongoing or progressive issues with redness and edema lower extremities associated with pain.  Has been progressively worsening.  Patient is anticoagulant on Eliquis .  Reports that his legs gave out on him.  Does not report loss of consciousness.  Does not report head trauma.   Imaging survey and emergency room overall reassuring.  No evidence of bleed.  No evidence of fracture on imaging of the hip.  Laboratory vesication significant for AKI on CKD.   On my evaluation patient is resting in bed.  Son at bedside.  Patient appears visibly in pain.  He does answer all questions appropriately.  Vital signs reassuring at time of my evaluation.   Assessment & Plan:   Principal Problem:   Frequent falls  Acute on chronic diastolic congestive heart failure Difficult to assess volume status given overall body habitus but patient does have edema on the lower extremities and elevated BNP higher than normal.  Volume status approaching euvolemia Plan: Continue home torsemide  Continue Aldactone  Continue home beta-blocker Strict ins and outs, daily weights Fluid restricted diet Monitor volume  status  Intractable back pain Low back, new lumbar spine.  X-ray imaging survey negative for fracture or dislocation of left hip.  Pain starts in lower spine and radiates down to left knee.  Strongly suspect sciatica.  Bilateral lumbar nerve impingement noted on MRI of lumbar spine Plan: Aggressive multimodal pain control DC IV Decadron  Start p.o. Decadron  with extended taper, added to discharge med rec Once 12-day Decadron  taper is complete patient can resume his previous prednisone  regimen   Bilateral lower extremity edema Superimposed cellulitis Chronic lymphedema Etiology of bilateral lower extremity edema is somewhat unclear.  Legs are erythematous and tender to touch which speaks more towards a cellulitic process.  However patient does not have a white count or fever to suggest underlying sepsis.  Cellulitic changes improving Plan: P.o. diuresis as above Continue p.o. 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   Again volume status difficult to ascertain.   Does not appear dry Diuresis as above Sodium level improved    Paroxysmal atrial fibrillation History of SVT Heart rate currently controlled, currently junctional rhythm Plan: Continue home Lopressor  Continue home Eliquis    History of myasthenia gravis Home prednisone  on hold while on Decadron  taper P.o. Decadron  for now Continue home Imuran    Bilateral carotid artery stenosis Previously on Plavix , however this appears to have been discontinued Continue home Eliquis  High intensity statin   Hypothyroidism Continue Synthroid    Parkinson disease Appears stable Continue home Sinemet   COPD Stable.  On room air.  No wheezing   Essential hypertension Continue home regimen    Hyperlipidemia Continue high intensity statin   Obesity BMI 35.  Complicates overall care and prognosis   DVT prophylaxis: Apixaban  Code Status: DNR Family Communication: Son via phone 9/6, 9/7 Disposition Plan: Status is: Inpatient Remains inpatient appropriate because: Intractable back pain, sciatica   Level of care: Telemetry Medical  Consultants:  None  Procedures:  None  Antimicrobials: Keflex    Subjective: Seen and examined.  Pain continues to improve  Objective: Vitals:   05/20/24 2001 05/20/24 2203 05/21/24 0319 05/21/24 0808  BP: (!) 119/54  123/69 (!) 144/74  Pulse: (!) 56 (!) 58 (!) 50 (!) 47  Resp: 18  18 14   Temp: (!) 97.4 F (36.3 C)  (!) 97.3 F (36.3 C) (!) 97.4 F (36.3 C)  TempSrc: Oral  Oral   SpO2: 93% 97% 94% 96%  Weight:   109.7 kg   Height:        Intake/Output Summary (Last 24 hours) at 05/21/2024 1446 Last data filed at 05/21/2024 1249 Gross per 24 hour  Intake 720 ml  Output 1600 ml  Net -880 ml   Filed Weights   05/19/24 0513 05/20/24 0244 05/21/24 0319  Weight: 109 kg 113.8 kg 109.7 kg    Examination:  General exam: No acute distress.  In good spirits Respiratory system: Lungs clear.  Normal work of breathing.  Room air Cardiovascular system: S1 S2, RRR, no murmurs, trace pedal edema BLE Gastrointestinal system: Obese, soft, NT/ND, normal bowel sounds Central nervous system: Alert and oriented. No focal neurological deficits. Extremities: Swelling bilateral lower extremity.  Decreased range of motion, improved from prior.  Gait not assessed Skin: Bilateral lower extremities erythematous, tender to touch, improved from prior Psychiatry: Judgement and insight appear normal. Mood & affect appropriate.     Data Reviewed: I have personally reviewed following labs and imaging studies  CBC: Recent Labs  Lab 05/17/24 0951 05/18/24 0417 05/19/24 0846 05/20/24 0942 05/21/24 0850  WBC 6.8 5.2 6.6 4.5 6.6  NEUTROABS 4.9   --  4.5 4.1 5.9  HGB 9.4* 8.7* 9.3* 9.8* 9.4*  HCT 30.4* 28.2* 29.6* 31.2* 31.2*  MCV 113.0* 112.8* 110.9* 111.0* 113.0*  PLT 237 202 193 190 208   Basic Metabolic Panel: Recent Labs  Lab 05/17/24 0951 05/18/24 0417 05/19/24 0846 05/20/24 0942 05/21/24 0850  NA 146* 140 137 138 137  K 4.0 3.9 4.1 4.4 4.5  CL 99 98 96* 97* 97*  CO2 32 33* 28 30 28   GLUCOSE 91 80 97 137* 134*  BUN 47* 43* 40* 45* 51*  CREATININE 1.99* 1.82* 2.02* 1.87* 1.97*  CALCIUM  9.1 8.3* 8.3* 8.8* 8.7*  MG 2.2  --   --   --   --    GFR: Estimated Creatinine Clearance: 37.7 mL/min (A) (by C-G formula based on SCr of 1.97 mg/dL (H)). Liver Function Tests: Recent Labs  Lab 05/17/24 0951  AST 16  ALT 7  ALKPHOS 52  BILITOT 1.0  PROT 6.5  ALBUMIN  3.6   No results for input(s): LIPASE, AMYLASE in the last 168 hours. No results for input(s): AMMONIA in the last 168 hours. Coagulation Profile: No results for input(s): INR, PROTIME in the last 168 hours. Cardiac Enzymes: No results for input(s): CKTOTAL, CKMB, CKMBINDEX, TROPONINI in the last 168 hours. BNP (last 3 results) No results for input(s): PROBNP in the last 8760 hours. HbA1C: No results for input(s):  HGBA1C in the last 72 hours. CBG: No results for input(s): GLUCAP in the last 168 hours. Lipid Profile: No results for input(s): CHOL, HDL, LDLCALC, TRIG, CHOLHDL, LDLDIRECT in the last 72 hours. Thyroid  Function Tests: No results for input(s): TSH, T4TOTAL, FREET4, T3FREE, THYROIDAB in the last 72 hours. Anemia Panel: No results for input(s): VITAMINB12, FOLATE, FERRITIN, TIBC, IRON , RETICCTPCT in the last 72 hours. Sepsis Labs: No results for input(s): PROCALCITON, LATICACIDVEN in the last 168 hours.  No results found for this or any previous visit (from the past 240 hours).       Radiology Studies: No results found.       Scheduled Meds:  amiodarone   200 mg Oral  Daily   apixaban   5 mg Oral BID   azaTHIOprine   150 mg Oral Daily   bumetanide   2 mg Oral Daily   carbidopa -levodopa   1 tablet Oral TID   cephALEXin   500 mg Oral Q8H   cholecalciferol   2,000 Units Oral Daily   cyanocobalamin   1,000 mcg Oral Daily   dexamethasone   4 mg Oral Q8H   levothyroxine   50 mcg Oral Q0600   lidocaine   1 patch Transdermal Q24H   methocarbamol   750 mg Oral QID   polyethylene glycol  17 g Oral Daily   potassium chloride   10 mEq Oral Daily   pregabalin   100 mg Oral BID   senna-docusate  1 tablet Oral BID   spironolactone   25 mg Oral Daily   Continuous Infusions:     LOS: 4 days    Francisco KATHEE Robson, MD [facility]   If 7PM-7AM, please contact night-coverage  05/21/2024, 2:46 PM

## 2024-05-22 DIAGNOSIS — N179 Acute kidney failure, unspecified: Secondary | ICD-10-CM | POA: Insufficient documentation

## 2024-05-22 DIAGNOSIS — R296 Repeated falls: Secondary | ICD-10-CM | POA: Diagnosis not present

## 2024-05-22 LAB — BASIC METABOLIC PANEL WITH GFR
Anion gap: 8 (ref 5–15)
BUN: 48 mg/dL — ABNORMAL HIGH (ref 8–23)
CO2: 29 mmol/L (ref 22–32)
Calcium: 8.5 mg/dL — ABNORMAL LOW (ref 8.9–10.3)
Chloride: 101 mmol/L (ref 98–111)
Creatinine, Ser: 1.68 mg/dL — ABNORMAL HIGH (ref 0.61–1.24)
GFR, Estimated: 41 mL/min — ABNORMAL LOW (ref >60)
Glucose, Bld: 128 mg/dL — ABNORMAL HIGH (ref 70–99)
Potassium: 4.6 mmol/L (ref 3.5–5.1)
Sodium: 138 mmol/L (ref 135–145)

## 2024-05-22 LAB — MAGNESIUM: Magnesium: 2.3 mg/dL (ref 1.7–2.4)

## 2024-05-22 MED ORDER — CEPHALEXIN 500 MG PO CAPS: 500.0000 mg | ORAL_CAPSULE | Freq: Three times a day (TID) | ORAL | Status: AC

## 2024-05-22 MED ORDER — AMIODARONE HCL 200 MG PO TABS: 200.0000 mg | ORAL_TABLET | Freq: Every day | ORAL | Status: DC

## 2024-05-22 MED ORDER — ACETAMINOPHEN 325 MG PO TABS: 650.0000 mg | ORAL_TABLET | Freq: Four times a day (QID) | ORAL | Status: DC | PRN

## 2024-05-22 MED ORDER — HYDROCODONE-ACETAMINOPHEN 10-325 MG PO TABS: 1.0000 | ORAL_TABLET | Freq: Four times a day (QID) | ORAL | 0 refills | Status: DC | PRN

## 2024-05-22 NOTE — Progress Notes (Signed)
 Occupational Therapy Treatment Patient Details Name: Francisco [name] MRN: [id number] DOB: [birth year] Today's Date: 05/22/2024   History of present illness Francisco [name] is a 78 y.o. male with medical history significant of SVT, chronic atrial fibrillation, chronic anticoagulation Eliquis , Parkinson disease on Sinemet , hypertension, hyperlipidemia, COPD, peripheral vascular disease, diastolic congestive heart failure, history of CVA, hypothyroidism, bilateral carotid artery stenosis, myasthenia gravis who presents with frequent falls. The patient states that he has had no recent changes in medication.  He has noted his bilateral lower extremities to be increasingly swollen. His dose of torsemide  was just increased by PCP. Patient presented to the emergency room after 4 falls in the 24 hours and resultant severe pain in the left hip.   OT comments  Pt seen for OT/PT co-tx this date. Pt reports pain well controlled, and is very motivated to participate. Pt requires MIN A for bed mobility, MOD A +2 for SPT to [facility] with HHA, MAX A for pericare in standing with BUE support on RW, and is able to take small pivotal steps back to bed with RW with MOD A +2. Pt below functional baseline, presents with significant BLE weakness and is a risk for falls. Discharge recommendation appropriate, OT will continue to follow.       If plan is discharge home, recommend the following:  Help with stairs or ramp for entrance;Assistance with cooking/housework;Assist for transportation;Two people to help with walking and/or transfers;Two people to help with bathing/dressing/bathroom   Equipment Recommendations  None recommended by OT       Precautions / Restrictions Precautions Precautions: Fall Recall of Precautions/Restrictions: Intact Restrictions Weight Bearing Restrictions Per Provider Order: No       Mobility Bed Mobility Overal bed mobility: Needs Assistance Bed Mobility: Sit to Supine     Supine  to sit: Min assist, HOB elevated, Used rails Sit to supine: HOB elevated, Used rails, Mod assist   General bed mobility comments: assist for trunk and LLE due to LLE discomfort    Transfers Overall transfer level: Needs assistance Equipment used: Rolling walker (2 wheels), 2 person hand held assist Transfers: Sit to/from Stand, Bed to chair/wheelchair/BSC Sit to Stand: Mod assist, +2 physical assistance     Step pivot transfers: Mod assist, +2 physical assistance, From elevated surface     General transfer comment: elevated bed height to perform STS with +2 HHA, pt is able to come to standing with L knee  blocked, cues for hand placement on BSC. used RW to stand for pericare and take pivotal steps towards bed     Balance Overall balance assessment: Needs assistance Sitting-balance support: Feet supported Sitting balance-Leahy Scale: Good     Standing balance support: Bilateral upper extremity supported Standing balance-Leahy Scale: Poor Standing balance comment: reliant on external UE support, poor tolerance to standing                           ADL either performed or assessed with clinical judgement   ADL Overall ADL's : Needs assistance/impaired                         Toilet Transfer: +2 for physical assistance;BSC/3in1;Moderate assistance Toilet Transfer Details (indicate cue type and reason): MOD A +2 for SPT HHA to [facility], RW used for increased stability to SPT for BSC > bed Toileting- Clothing Manipulation and Hygiene: Maximal assistance;Sit to/from stand Toileting - Architect Details (indicate cue  type and reason): maxA for standing pericaer after continent BM on BSC, RW used for BUE support     Functional mobility during ADLs: Moderate assistance;+2 for physical assistance;Cueing for safety;Cueing for sequencing;Rolling walker (2 wheels) General ADL Comments: +2 HHA for safe transfer bed > BSC, RW MOD A +2 BSC > bed. MAX A for standing  pericare     Communication Communication Communication: No apparent difficulties   Cognition Arousal: Alert Behavior During Therapy: WFL for tasks assessed/performed Cognition: No apparent impairments                               Following commands: Intact        Cueing   Cueing Techniques: Verbal cues, Tactile cues        General Comments RN notified of BM    Pertinent Vitals/ Pain       Pain Assessment Pain Assessment: 0-10 Pain Score: 5  Pain Location: L LE Pain Descriptors / Indicators: Discomfort, Grimacing, Aching Pain Intervention(s): Limited activity within patient's tolerance, Monitored during session, Repositioned         Frequency  Min 2X/week        Progress Toward Goals  OT Goals(current goals can now be found in the care plan section)  Progress towards OT goals: Progressing toward goals  Acute Rehab OT Goals OT Goal Formulation: With patient Time For Goal Achievement: 06/01/24 Potential to Achieve Goals: Good ADL Goals Pt Will Perform Lower Body Dressing: with min assist;sitting/lateral leans Pt Will Transfer to Toilet: with min assist;stand pivot transfer Additional ADL Goal #1: Pt will transfer bed<>WC with SUPV  Plan      Co-evaluation    PT/OT/SLP Co-Evaluation/Treatment: Yes Reason for Co-Treatment: Complexity of the patient's impairments (multi-system involvement);For patient/therapist safety PT goals addressed during session: Mobility/safety with mobility;Balance;Proper use of DME OT goals addressed during session: ADL's and self-care      AM-PAC OT 6 Clicks Daily Activity     Outcome Measure   Help from another person eating meals?: None Help from another person taking care of personal grooming?: A Little Help from another person toileting, which includes using toliet, bedpan, or urinal?: A Lot Help from another person bathing (including washing, rinsing, drying)?: A Lot Help from another person to put on  and taking off regular upper body clothing?: A Little Help from another person to put on and taking off regular lower body clothing?: A Lot 6 Click Score: 16    End of Session Equipment Utilized During Treatment: Rolling walker (2 wheels)  OT Visit Diagnosis: Unsteadiness on feet (R26.81);Other abnormalities of gait and mobility (R26.89);Repeated falls (R29.6);Muscle weakness (generalized) (M62.81);Pain Pain - Right/Left: Left Pain - part of body: Leg   Activity Tolerance Patient tolerated treatment well   Patient Left in bed;with call bell/phone within reach;with bed alarm set   Nurse Communication Mobility status        Time: 1404-1430 OT Time Calculation (min): 26 min  Charges: OT General Charges $OT Visit: 1 Visit OT Treatments $Self Care/Home Management : 8-22 mins  Talajah Slimp L. Mahari Vankirk, OTR/L  05/22/24, 4:18 PM

## 2024-05-22 NOTE — Progress Notes (Incomplete)
 Progress Note    Francisco [name]  FMW:[id number] DOB: 07-Feb-1946  DOA: 05/17/2024 PCP: Fernande Ophelia JINNY DOUGLAS, MD      Brief Narrative:    Medical records reviewed and are as summarized below:  Francisco [name] is a 78 y.o. male medical history significant of SVT, chronic atrial fibrillation, chronic anticoagulation Eliquis , Parkinson disease on Sinemet , hypertension, hyperlipidemia, COPD, peripheral vascular disease, diastolic congestive heart failure, history of CVA, hypothyroidism, bilateral carotid artery stenosis, myasthenia gravis, chronic left lower extremity/thigh pain, chronic back pain, who presented to the hospital because of frequent falls.  He reported increasing swelling of bilateral lower extremities associated with some redness and pain.  His torsemide  had been increased by his PCP recently.  He is on Eliquis .       Assessment/Plan:   Principal Problem:   Frequent falls   Body mass index is 34.16 kg/m.  (Class I obesity)   Acute on chronic diastolic CHF, bilateral lower extremity edema: Continue bumetanide  and spironolactone . TEE on 02/22/2023 showed EF estimated at 55 to 60%, moderate to severe TR, mild MR, severely dilated right atrium, mildly dilated left atrium.   Suspected cellulitis of bilateral legs, chronic lymphedema: Continue Keflex . 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  His creatinine and GFR has been fluctuating in the past and it is difficult to determine exact CKD stage at this time.   Acute on chronic low back pain, chronic left thigh pain: MRI lumbar spine showed degenerative disc disease at L4-5 with mild spinal stenosis and severe bilateral neuroforaminal stenosis at L3-4 and L5-S1.   Paroxysmal atrial fibrillation with history of cardioversion, history of SVT: Continue amiodarone , metoprolol  and Eliquis    History of myasthenia gravis and  Parkinson's disease: Continue azathioprine , dexamethasone  and Sinemet    Comorbidities include Parkinson's disease on Sinemet , COPD, pulmonary hypertension, PVD and carotid artery stenosis on Plavix , hypothyroidism, hypertension, hyperlipidemia, obesity,     Diet Order             Diet Heart Fluid consistency: Thin; Fluid restriction: 1800 mL Fluid  Diet effective now                       {PT Orders Active  (Optional):1} {Skilled Nursing-Short Term Rehab (<3 Hours/Day)05/21/2024 1503 (Optional):1}          Consultants: None  Procedures: None    Medications:    amiodarone   200 mg Oral Daily   apixaban   5 mg Oral BID   azaTHIOprine   150 mg Oral Daily   bumetanide   2 mg Oral Daily   carbidopa -levodopa   1 tablet Oral TID   cephALEXin   500 mg Oral Q8H   cholecalciferol   2,000 Units Oral Daily   cyanocobalamin   1,000 mcg Oral Daily   dexamethasone   4 mg Oral Q8H   levothyroxine   50 mcg Oral Q0600   lidocaine   1 patch Transdermal Q24H   methocarbamol   750 mg Oral QID   polyethylene glycol  17 g Oral Daily   potassium chloride   10 mEq Oral Daily   pregabalin   100 mg Oral BID   senna-docusate  1 tablet Oral BID   spironolactone   25 mg Oral Daily   Continuous Infusions:   Anti-infectives (From admission, onward)    Start     Dose/Rate Route Frequency Ordered Stop   05/20/24 1400  cephALEXin  (KEFLEX ) capsule 500  mg        500 mg Oral Every 8 hours 05/20/24 0908 05/24/24 1450   05/17/24 1630  ceFAZolin  (ANCEF ) IVPB 2g/100 mL premix  Status:  Discontinued        2 g 200 mL/hr over 30 Minutes Intravenous Every 8 hours 05/17/24 1626 05/20/24 0907              Family Communication/Anticipated D/C date and plan/Code Status   DVT prophylaxis:  apixaban  (ELIQUIS ) tablet 5 mg     Code Status: Do not attempt resuscitation (DNR) PRE-ARREST INTERVENTIONS DESIRED  Family Communication: None Disposition Plan: Plan to discharge to SNF   Status  is: Inpatient Remains inpatient appropriate because: Awaiting placement to SNF       Subjective:   Interval events noted.  He complains of chronic low back pain and chronic pain in the left thigh.  He also complains of burning in bilateral legs and pain in the left leg.  No shortness of breath or chest pain.  Objective:    Vitals:   05/21/24 1958 05/22/24 0353 05/22/24 0443 05/22/24 0833  BP: 118/65 110/65  119/73  Pulse: 84 79  75  Resp: 20 16  16   Temp: 97.7 F (36.5 C) (!) 97.5 F (36.4 C)  97.9 F (36.6 C)  TempSrc: Oral Oral  Oral  SpO2: 96% 95%  94%  Weight:   108 kg   Height:       No data found.   Intake/Output Summary (Last 24 hours) at 05/22/2024 1007 Last data filed at 05/22/2024 0936 Gross per 24 hour  Intake 480 ml  Output 2600 ml  Net -2120 ml   Filed Weights   05/20/24 0244 05/21/24 0319 05/22/24 0443  Weight: 113.8 kg 109.7 kg 108 kg    Exam:  GEN: NAD SKIN: Warm and dry EYES: No pallor or icterus ENT: MMM CV: RRR PULM: CTA B ABD: soft, ND, NT, +BS CNS: AAO x 3, non focal EXT: B/l leg edema, no tenderness       Data Reviewed:   I have personally reviewed following labs and imaging studies:  Labs: Labs show the following:   Basic Metabolic Panel: Recent Labs  Lab 05/17/24 0951 05/18/24 0417 05/19/24 0846 05/20/24 0942 05/21/24 0850 05/22/24 0332  NA 146* 140 137 138 137 138  K 4.0 3.9 4.1 4.4 4.5 4.6  CL 99 98 96* 97* 97* 101  CO2 32 33* 28 30 28 29   GLUCOSE 91 80 97 137* 134* 128*  BUN 47* 43* 40* 45* 51* 48*  CREATININE 1.99* 1.82* 2.02* 1.87* 1.97* 1.68*  CALCIUM  9.1 8.3* 8.3* 8.8* 8.7* 8.5*  MG 2.2  --   --   --   --  2.3   GFR Estimated Creatinine Clearance: 43.9 mL/min (A) (by C-G formula based on SCr of 1.68 mg/dL (H)). Liver Function Tests: Recent Labs  Lab 05/17/24 0951  AST 16  ALT 7  ALKPHOS 52  BILITOT 1.0  PROT 6.5  ALBUMIN  3.6   No results for input(s): LIPASE, AMYLASE in the last 168  hours. No results for input(s): AMMONIA in the last 168 hours. Coagulation profile No results for input(s): INR, PROTIME in the last 168 hours.  CBC: Recent Labs  Lab 05/17/24 0951 05/18/24 0417 05/19/24 0846 05/20/24 0942 05/21/24 0850  WBC 6.8 5.2 6.6 4.5 6.6  NEUTROABS 4.9  --  4.5 4.1 5.9  HGB 9.4* 8.7* 9.3* 9.8* 9.4*  HCT 30.4* 28.2* 29.6* 31.2*  31.2*  MCV 113.0* 112.8* 110.9* 111.0* 113.0*  PLT 237 202 193 190 208   Cardiac Enzymes: No results for input(s): CKTOTAL, CKMB, CKMBINDEX, TROPONINI in the last 168 hours. BNP (last 3 results) No results for input(s): PROBNP in the last 8760 hours. CBG: No results for input(s): GLUCAP in the last 168 hours. D-Dimer: No results for input(s): DDIMER in the last 72 hours. Hgb A1c: No results for input(s): HGBA1C in the last 72 hours. Lipid Profile: No results for input(s): CHOL, HDL, LDLCALC, TRIG, CHOLHDL, LDLDIRECT in the last 72 hours. Thyroid  function studies: No results for input(s): TSH, T4TOTAL, T3FREE, THYROIDAB in the last 72 hours.  Invalid input(s): FREET3 Anemia work up: No results for input(s): VITAMINB12, FOLATE, FERRITIN, TIBC, IRON , RETICCTPCT in the last 72 hours. Sepsis Labs: Recent Labs  Lab 05/18/24 0417 05/19/24 0846 05/20/24 0942 05/21/24 0850  WBC 5.2 6.6 4.5 6.6    Microbiology No results found for this or any previous visit (from the past 240 hours).  Procedures and diagnostic studies:  No results found.             LOS: 5 days   Lavender Stanke  [facility]   Pager on [email or web address]. If 7PM-7AM, please contact night-coverage at [email or web address]     05/22/2024, 10:07 AM

## 2024-05-22 NOTE — Discharge Summary (Addendum)
 Physician Discharge Summary   Patient: Francisco [name] MRN: [id number] DOB: 1946-05-13  Admit date:     05/17/2024  Discharge date: 05/22/24  Discharge Physician: AIDA CHO   PCP: Fernande Ophelia JINNY DOUGLAS, MD   Recommendations at discharge:   Follow-up with physician at the nursing home within 3 days of discharge Repeat basic metabolic panel within 1 week of discharge to monitor kidney function and electrolytes   Discharge Diagnoses: Principal Problem:   Frequent falls Active Problems:   Acute on chronic diastolic CHF (congestive heart failure) (HCC)   Paroxysmal atrial fibrillation (HCC)   Lower extremity cellulitis   Myasthenia gravis (HCC)   Parkinson disease (HCC)   Spinal stenosis, lumbar region, with neurogenic claudication   AKI (acute kidney injury) (HCC)  Resolved Problems:   * No resolved hospital problems. *  Hospital Course:  Francisco [name] is a 78 y.o. male medical history significant of SVT, chronic atrial fibrillation, chronic anticoagulation Eliquis , Parkinson disease on Sinemet , hypertension, hyperlipidemia, COPD, peripheral vascular disease, diastolic congestive heart failure, history of CVA, hypothyroidism, bilateral carotid artery stenosis, myasthenia gravis, chronic left lower extremity/thigh pain, chronic back pain, who presented to the hospital because of frequent falls.  He reported increasing swelling of bilateral lower extremities associated with some redness and pain.  His torsemide  had been increased by his PCP recently.  He is on Eliquis .     Assessment and Plan:   Acute on chronic diastolic CHF, bilateral lower extremity edema: Continue bumetanide  and spironolactone . TEE on 02/22/2023 showed EF estimated at 55 to 60%, moderate to severe TR, mild MR, severely dilated right atrium, mildly dilated left atrium.     Suspected cellulitis of bilateral legs, chronic lymphedema: Continue Keflex . 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  Repeat creatinine within 1 week of discharge.  His creatinine and GFR has been fluctuating in the past and it is difficult to determine exact CKD stage at this time.     Acute on chronic low back pain, chronic left thigh pain: MRI lumbar spine showed degenerative disc disease at L4-5 with mild spinal stenosis and severe bilateral neuroforaminal stenosis at L3-4 and L5-S1. Frequent falls: PT recommended discharge to SNF   Paroxysmal atrial fibrillation with history of cardioversion, history of SVT: Continue amiodarone , metoprolol  and Eliquis      History of myasthenia gravis and Parkinson's disease: Continue azathioprine , dexamethasone  and Sinemet      Comorbidities include Parkinson's disease on Sinemet , COPD, pulmonary hypertension, PVD and carotid artery stenosis on Plavix , hypothyroidism, hypertension, hyperlipidemia, obesity,     His condition has improved and he is deemed stable for discharge to SNF today.        Pain control - [place]  Controlled Substance Reporting System database was reviewed. and patient was instructed, not to drive, operate heavy machinery, perform activities at heights, swimming or participation in water activities or provide baby-sitting services while on Pain, Sleep and Anxiety Medications; until their outpatient Physician has advised to do so again. Also recommended to not to take more than prescribed Pain, Sleep and Anxiety Medications.  Consultants: None Procedures performed: None  Disposition: Skilled nursing facility Diet recommendation:  Discharge Diet Orders (From admission, onward)     Start     Ordered   05/22/24 0000  Diet - low sodium heart healthy        05/22/24 1505  Cardiac diet DISCHARGE MEDICATION: Allergies as of 05/22/2024       Reactions   Azithromycin Other (See Comments)   Avoid due to myasthenia gravis   Codeine Nausea And Vomiting         Medication List     STOP taking these medications    metoprolol  tartrate 25 MG tablet Commonly known as: LOPRESSOR    nystatin cream Commonly known as: MYCOSTATIN   potassium chloride  10 MEQ tablet Commonly known as: KLOR-CON  M   torsemide  20 MG tablet Commonly known as: DEMADEX        TAKE these medications    acetaminophen  325 MG tablet Commonly known as: TYLENOL  Take 2 tablets (650 mg total) by mouth every 6 (six) hours as needed for mild pain (pain score 1-3) or fever (or Fever >/= 101).   amiodarone  200 MG tablet Commonly known as: PACERONE  Take 1 tablet (200 mg total) by mouth daily. What changed: See the new instructions.   azaTHIOprine  50 MG tablet Commonly known as: IMURAN  Take 150 mg by mouth daily.   bisacodyl  5 MG EC tablet Commonly known as: DULCOLAX Take 2 tablets (10 mg total) by mouth at bedtime as needed for moderate constipation. What changed: Another medication with the same name was removed. Continue taking this medication, and follow the directions you see here.   bumetanide  2 MG tablet Commonly known as: BUMEX  Take 2 mg by mouth daily. What changed: Another medication with the same name was removed. Continue taking this medication, and follow the directions you see here.   carbidopa -levodopa  25-100 MG tablet Commonly known as: SINEMET  IR Take 1 tablet by mouth 3 (three) times daily.   cephALEXin  500 MG capsule Commonly known as: KEFLEX  Take 1 capsule (500 mg total) by mouth every 8 (eight) hours for 6 doses.   cyanocobalamin  1000 MCG tablet Commonly known as: VITAMIN B12 Take 1,000 mcg by mouth daily.   D3-1000 25 MCG (1000 UT) capsule Generic drug: Cholecalciferol  Take 2,000 Units by mouth daily.   dapagliflozin  propanediol 10 MG Tabs tablet Commonly known as: FARXIGA  Take 1 tablet (10 mg total) by mouth daily.   Eliquis  5 MG Tabs tablet Generic drug: apixaban  Take 5 mg by mouth 2 (two) times daily.    HYDROcodone -acetaminophen  10-325 MG tablet Commonly known as: NORCO Take 1-2 tablets by mouth every 6 (six) hours as needed for moderate pain (pain score 4-6) or severe pain (pain score 7-10).   levothyroxine  50 MCG tablet Commonly known as: SYNTHROID  Take 1 tablet (50 mcg total) by mouth daily at 6 (six) AM.   meclizine  25 MG tablet Commonly known as: ANTIVERT  Take 1 tablet by mouth 3 (three) times daily as needed for dizziness.   melatonin 3 MG Tabs tablet Take 1 tablet (3 mg total) by mouth at bedtime.   polyethylene glycol 17 g packet Commonly known as: MIRALAX  / GLYCOLAX  Take 17 g by mouth daily. Skip the dose if no constipation   predniSONE  10 MG tablet Commonly known as: DELTASONE  Take 10 mg by mouth every Monday, Wednesday, and Friday.   pregabalin  50 MG capsule Commonly known as: LYRICA  Take 50 mg by mouth 2 (two) times daily.   spironolactone  25 MG tablet Commonly known as: ALDACTONE  Take 1 tablet (25 mg total) by mouth daily.        Discharge Exam: Filed Weights   05/20/24 0244 05/21/24 0319 05/22/24 0443  Weight: 113.8 kg 109.7 kg 108 kg   GEN: NAD SKIN: Warm and dry EYES:  No pallor or icterus ENT: MMM CV: RRR PULM: CTA B ABD: soft, obese, NT, +BS CNS: AAO x 3, non focal EXT: B/l leg edema, no tenderness   Condition at discharge: good  The results of significant diagnostics from this hospitalization (including imaging, microbiology, ancillary and laboratory) are listed below for reference.   Imaging Studies: MR Lumbar Spine W Wo Contrast Result Date: 05/18/2024 CLINICAL DATA:  Lumbar radiculopathy, trauma. Multiple recent falls. Left hip pain. History of lumbar surgery. EXAM: MRI LUMBAR SPINE WITHOUT AND WITH CONTRAST TECHNIQUE: Multiplanar and multiecho pulse sequences of the lumbar spine were obtained without and with intravenous contrast. CONTRAST:  10mL GADAVIST  GADOBUTROL  1 MMOL/ML IV SOLN COMPARISON:  Lumbar spine MRI 03/10/2021 FINDINGS:  The study is motion degraded, most notably on axial sequences including severe motion on the axial T2 sequence. Segmentation: Standard. Alignment:  Unchanged grade 1 retrolisthesis of L5 on S1. Vertebrae: No fracture or suspicious marrow lesion. New degenerative endplate changes at L4-5 including mild degenerative edema. Conus medullaris and cauda equina: Conus extends to the L2 level and is normal in signal. Limited assessment of the cauda equina due to motion. Paraspinal and other soft tissues: Postoperative changes in the posterior lumbar soft tissues. Bilateral renal atrophy. Disc levels: Disc desiccation throughout the lumbar spine with mild disc space narrowing at L1-2 and L2-3, moderate narrowing at L3-4, and moderate to severe narrowing at L4-5 and L5-S1. T12-L1: Mild disc bulging without evidence of significant stenosis. L1-2: Disc bulging, prominent epidural fat, and mild facet hypertrophy result in mild spinal stenosis without significant neural foraminal stenosis, similar to prior. L2-3: Interval laminectomies. Disc bulging and moderate facet hypertrophy without evidence of significant stenosis. L3-4: Previous laminectomies. Disc bulging, a right paracentral to subarticular disc extrusion with mild cephalad migration, bilateral foraminal disc protrusions, and moderate facet hypertrophy result in mild-to-moderate spinal stenosis, moderate to severe right and mild-to-moderate left lateral recess stenosis, and severe bilateral neural foraminal stenosis, similar to prior. Potential bilateral L3 and right L4 nerve root impingement. L4-5: Previous laminectomies. Circumferential disc bulging, a left foraminal disc extrusion, and severe facet hypertrophy result in mild spinal stenosis, likely moderate bilateral lateral recess stenosis, and severe left greater than right neural foraminal stenosis, mildly progressed. Potential bilateral L4 and L5 nerve root impingement. L5-S1: Previous laminectomies.  Circumferential disc bulging and moderate to severe facet hypertrophy result in mild-to-moderate bilateral lateral recess stenosis and severe bilateral neural foraminal stenosis without significant spinal stenosis, similar to prior. Potential bilateral L5 nerve root impingement. IMPRESSION: 1. Motion degraded examination. 2. Interval L2-3 posterior decompression without significant residual stenosis. 3. Progressive disc degeneration at L4-5 with mild spinal stenosis and severe bilateral neural foraminal stenosis. 4. Unchanged severe bilateral neural foraminal stenosis at L3-4 and L5-S1. Electronically Signed   By: Dasie Hamburg M.D.   On: 05/18/2024 19:30   US  Venous Img Lower Bilateral Result Date: 05/17/2024 CLINICAL DATA:  Bilateral lower extremity pain and swelling, left side greater than right. Varicose veins. Prostate carcinoma and melanoma. EXAM: BILATERAL LOWER EXTREMITY VENOUS DOPPLER ULTRASOUND TECHNIQUE: Gray-scale sonography with compression, as well as color and duplex ultrasound, were performed to evaluate the deep venous system(s) from the level of the common femoral vein through the popliteal and proximal calf veins. COMPARISON:  02/27/2024 FINDINGS: VENOUS Normal compressibility of the common femoral, superficial femoral, and popliteal veins, as well as the visualized calf veins throughout both lower extremities. Visualized portions of profunda femoral vein and great saphenous vein unremarkable in both lower extremities. No  filling defects to suggest DVT on grayscale or color Doppler imaging. Doppler waveforms show normal direction of venous flow, normal respiratory plasticity and response to augmentation in both lower extremities. A OTHER None. Limitations: none IMPRESSION: No evidence of DVT in either lower extremity. Electronically Signed   By: Norleen DELENA Kil M.D.   On: 05/17/2024 12:50   DG Hip Unilat W or Wo Pelvis 2-3 Views Left Result Date: 05/17/2024 EXAM: 2 or more VIEW(S) XRAY OF THE LEFT  HIP 05/17/2024 11:40:12 AM COMPARISON: None available. CLINICAL HISTORY: Fall, leg pain. Leg swelling. FINDINGS: BONES AND JOINTS: No acute fracture or focal osseous lesion. The hip joint is maintained. Mild degenerative changes of left hip. Degenerative changes of visualized lower lumbar spine. SOFT TISSUES: The soft tissues are unremarkable. Surgical clips overlying pelvis. Vascular calcifications. IMPRESSION: 1. No acute fracture or dislocation of the left hip or visualized pelvis. 2. Mild degenerative changes of the left hip. Electronically signed by: Donnice Mania MD 05/17/2024 11:52 AM EDT RP Workstation: [id number]   CT Cervical Spine Wo Contrast Result Date: 05/17/2024 EXAM: CT CERVICAL SPINE WITHOUT CONTRAST 05/17/2024 11:28:11 AM TECHNIQUE: CT of the cervical spine was performed without the administration of intravenous contrast. Multiplanar reformatted images are provided for review. Automated exposure control, iterative reconstruction, and/or weight based adjustment of the mA/kV was utilized to reduce the radiation dose to as low as reasonably achievable. COMPARISON: CT of the cervical spine dated 12/05/2000. CLINICAL HISTORY: Polytrauma, blunt. fallen x4 in 2 days. +blood thinners. Bruising on chest FINDINGS: CERVICAL SPINE: BONES AND ALIGNMENT: The patient is again noted to be status post bilateral laminectomies and bilateral posterolateral spinal fusion of C3 through C5. There is loosening of the orthopedic screws at C3 again demonstrated bilaterally. The patient is also again noted to be status post ACDF (anterior cervical discectomy and fusion) of C5 through C7, with completed fusion. There is no definite evidence of acute traumatic injury. The study is degraded by patient motion. DEGENERATIVE CHANGES: No significant degenerative changes. SOFT TISSUES: No prevertebral soft tissue swelling. VASCULATURE: A vascular stent is noted within the right common carotid artery. IMPRESSION: 1. No definite  evidence of acute traumatic injury. 2. Status post bilateral laminectomies and bilateral posterolateral spinal fusion of C3 through C5 with loosening of the orthopedic screws at C3 bilaterally. 3. Status post ACDF of C5 through C7 with completed fusion. 4. Study degraded by patient motion. Electronically signed by: Evalene Coho MD 05/17/2024 11:51 AM EDT RP Workstation: [id number]   CT Chest Wo Contrast Result Date: 05/17/2024 EXAM: CT CHEST WITHOUT CONTRAST 05/17/2024 11:28:11 AM TECHNIQUE: CT of the chest was performed without the administration of intravenous contrast. Multiplanar reformatted images are provided for review. Automated exposure control, iterative reconstruction, and/or weight based adjustment of the mA/kV was utilized to reduce the radiation dose to as low as reasonably achievable. COMPARISON: CT of the chest dated 12/05/2000. CLINICAL HISTORY: Chest trauma, blunt; right chest wall hematoma. fallen x4 in 2 days. +blood thinners. Bruising on chest FINDINGS: MEDIASTINUM AND LYMPH NODES: The heart is mildly enlarged. There is moderate calcific coronary artery disease. There is moderate calcification within the thoracic aorta. The patient is status post ACDF of C5-6 and C6-7. No mediastinal, hilar or axillary lymphadenopathy. LUNGS AND PLEURA: There are streaky peribronchovascular opacities again demonstrated within the bases of the right middle and lower lobes which are likely chronic but could be recurrent. There is mild atelectasis present independently within the left lower lobe. No pleural effusion or pneumothorax. SOFT  TISSUES/BONES: No acute abnormality of the bones or soft tissues. UPPER ABDOMEN: Limited images of the upper abdomen demonstrates no acute abnormality. IMPRESSION: 1. No acute findings. 2. Streaky peribronchovascular opacities in the bases of the right middle and lower lobes, likely chronic but could be recurrent. 3. Mild atelectasis in the left lower lobe. 4. Mildly enlarged  heart with moderate calcific coronary artery disease and moderate calcification within the thoracic aorta. Electronically signed by: Evalene Coho MD 05/17/2024 11:46 AM EDT RP Workstation: [id number]   CT Head Wo Contrast Result Date: 05/17/2024 EXAM: CT HEAD WITHOUT CONTRAST 05/17/2024 11:28:11 AM TECHNIQUE: CT of the head was performed without the administration of intravenous contrast. Automated exposure control, iterative reconstruction, and/or weight based adjustment of the mA/kV was utilized to reduce the radiation dose to as low as reasonably achievable. COMPARISON: CT angiogram of the head dated 05/31/2023. CLINICAL HISTORY: Polytrauma, blunt. fallen x4 in 2 days. +blood thinners. Bruising on chest. FINDINGS: BRAIN AND VENTRICLES: No acute hemorrhage. No evidence of acute infarct. No hydrocephalus. No extra-axial collection. No mass effect or midline shift. ORBITS: Status post bilateral lens replacement. No acute abnormality. SINUSES: Mild mucosal disease within the ethmoid and maxillary sinuses. No acute abnormality. SOFT TISSUES AND SKULL: No acute soft tissue abnormality. No skull fracture. IMPRESSION: 1. No acute intracranial abnormality. 2. Mild mucosal disease within the ethmoid and maxillary sinuses. Electronically signed by: Evalene Coho MD 05/17/2024 11:32 AM EDT RP Workstation: [id number]    Microbiology: Results for orders placed or performed during the hospital encounter of 12/29/21  Urine Culture     Status: None   Collection Time: 12/30/21 10:52 AM   Specimen: Urine, Clean Catch  Result Value Ref Range Status   Specimen Description   Final    URINE, CLEAN CATCH Performed at [facility], [address], [place], [place] [address]    Special Requests   Final    NONE Performed at [facility], [address] [place], [place] [address]    Culture   Final    NO GROWTH Performed at [facility], [address], [place], [place] [address]     Report Status 12/31/2021 FINAL  Final    Labs: CBC: Recent Labs  Lab 05/17/24 0951 05/18/24 0417 05/19/24 0846 05/20/24 0942 05/21/24 0850  WBC 6.8 5.2 6.6 4.5 6.6  NEUTROABS 4.9  --  4.5 4.1 5.9  HGB 9.4* 8.7* 9.3* 9.8* 9.4*  HCT 30.4* 28.2* 29.6* 31.2* 31.2*  MCV 113.0* 112.8* 110.9* 111.0* 113.0*  PLT 237 202 193 190 208   Basic Metabolic Panel: Recent Labs  Lab 05/17/24 0951 05/18/24 0417 05/19/24 0846 05/20/24 0942 05/21/24 0850 05/22/24 0332  NA 146* 140 137 138 137 138  K 4.0 3.9 4.1 4.4 4.5 4.6  CL 99 98 96* 97* 97* 101  CO2 32 33* 28 30 28 29   GLUCOSE 91 80 97 137* 134* 128*  BUN 47* 43* 40* 45* 51* 48*  CREATININE 1.99* 1.82* 2.02* 1.87* 1.97* 1.68*  CALCIUM  9.1 8.3* 8.3* 8.8* 8.7* 8.5*  MG 2.2  --   --   --   --  2.3   Liver Function Tests: Recent Labs  Lab 05/17/24 0951  AST 16  ALT 7  ALKPHOS 52  BILITOT 1.0  PROT 6.5  ALBUMIN  3.6   CBG: No results for input(s): GLUCAP in the last 168 hours.  Discharge time spent: greater than 30 minutes.  Signed: AIDA CHO, MD [facility] 05/22/2024

## 2024-05-22 NOTE — TOC Progression Note (Signed)
 Transition of Care [facility]) - Progression Note    Patient Details  Name: Francisco [name] MRN: [id number] Date of Birth: 17-Jul-1946  Transition of Care [facility]) CM/SW Contact  [name], RN Phone Number: 05/22/2024, 9:05 AM  Clinical Narrative:     Shara has been received for [facility].  MD notified                     Expected Discharge Plan and Services                                               Social Drivers of Health (SDOH) Interventions SDOH Screenings   Food Insecurity: No Food Insecurity (05/17/2024)  Housing: Low Risk  (05/21/2024)  Transportation Needs: Unmet Transportation Needs (05/17/2024)  Utilities: Not At Risk (05/17/2024)  Depression (PHQ2-9): Low Risk  (08/30/2021)  Financial Resource Strain: Low Risk  (04/30/2024)   Received from [facility]  Social Connections: Unknown (05/17/2024)  Tobacco Use: Medium Risk (05/17/2024)    Readmission Risk Interventions     No data to display

## 2024-05-22 NOTE — Plan of Care (Signed)

## 2024-05-22 NOTE — TOC Transition Note (Signed)
 Transition of Care [facility]) - Discharge Note   Patient Details  Name: Francisco [name] MRN: [id number] Date of Birth: [birth year]  [facility]) CM/SW Contact:  [name], RN Phone Number: 05/22/2024, 4:13 PM   Clinical Narrative:     Patient will DC to: [name] Anticipated DC date: 05/22/24  Family notified: [name] by: [name]  Per MD patient ready for DC to . RN, patient, patient's family, and facility notified of DC. Discharge Summary sent to facility. RN given number for report. DC packet on chart. Ambulance transport requested for patient.   TOC signing off.         Patient Goals and CMS Choice            Discharge Placement                       Discharge Plan and Services Additional resources added to the After Visit Summary for                                       Social Drivers of Health (SDOH) Interventions SDOH Screenings   Food Insecurity: No Food Insecurity (05/17/2024)  Housing: Low Risk  (05/21/2024)  Transportation Needs: Unmet Transportation Needs (05/17/2024)  Utilities: Not At Risk (05/17/2024)  Depression (PHQ2-9): Low Risk  (08/30/2021)  Financial Resource Strain: Low Risk  (04/30/2024)   Received from [facility]  Social Connections: Unknown (05/17/2024)  Tobacco Use: Medium Risk (05/17/2024)     Readmission Risk Interventions     No data to display

## 2024-06-11 ENCOUNTER — Inpatient Hospital Stay
Admission: EM | Admit: 2024-06-11 | Discharge: 2024-06-20 | DRG: 291 | Disposition: A | Discharge status: Skilled Nursing Facility | Source: Skilled Nursing Facility | Attending: Internal Medicine | Admitting: Internal Medicine

## 2024-06-11 ENCOUNTER — Emergency Department

## 2024-06-11 ENCOUNTER — Other Ambulatory Visit: Payer: Self-pay

## 2024-06-11 DIAGNOSIS — M7122 Synovial cyst of popliteal space [Baker], left knee: Secondary | ICD-10-CM | POA: Diagnosis present

## 2024-06-11 DIAGNOSIS — G20A1 Parkinson's disease without dyskinesia, without mention of fluctuations: Secondary | ICD-10-CM | POA: Diagnosis present

## 2024-06-11 DIAGNOSIS — Z8673 Personal history of transient ischemic attack (TIA), and cerebral infarction without residual deficits: Secondary | ICD-10-CM | POA: Diagnosis not present

## 2024-06-11 DIAGNOSIS — L03115 Cellulitis of right lower limb: Secondary | ICD-10-CM | POA: Diagnosis present

## 2024-06-11 DIAGNOSIS — R54 Age-related physical debility: Secondary | ICD-10-CM | POA: Diagnosis present

## 2024-06-11 DIAGNOSIS — M7121 Synovial cyst of popliteal space [Baker], right knee: Secondary | ICD-10-CM | POA: Diagnosis present

## 2024-06-11 DIAGNOSIS — I2489 Other forms of acute ischemic heart disease: Secondary | ICD-10-CM | POA: Diagnosis present

## 2024-06-11 DIAGNOSIS — I480 Paroxysmal atrial fibrillation: Secondary | ICD-10-CM | POA: Diagnosis present

## 2024-06-11 DIAGNOSIS — Z7952 Long term (current) use of systemic steroids: Secondary | ICD-10-CM | POA: Diagnosis not present

## 2024-06-11 DIAGNOSIS — I639 Cerebral infarction, unspecified: Secondary | ICD-10-CM | POA: Diagnosis not present

## 2024-06-11 DIAGNOSIS — R310 Gross hematuria: Secondary | ICD-10-CM | POA: Diagnosis not present

## 2024-06-11 DIAGNOSIS — J4489 Other specified chronic obstructive pulmonary disease: Secondary | ICD-10-CM | POA: Diagnosis present

## 2024-06-11 DIAGNOSIS — N179 Acute kidney failure, unspecified: Secondary | ICD-10-CM | POA: Diagnosis present

## 2024-06-11 DIAGNOSIS — N1831 Chronic kidney disease, stage 3a: Secondary | ICD-10-CM | POA: Diagnosis present

## 2024-06-11 DIAGNOSIS — Z7901 Long term (current) use of anticoagulants: Secondary | ICD-10-CM | POA: Diagnosis not present

## 2024-06-11 DIAGNOSIS — R29898 Other symptoms and signs involving the musculoskeletal system: Secondary | ICD-10-CM | POA: Diagnosis not present

## 2024-06-11 DIAGNOSIS — Z5982 Transportation insecurity: Secondary | ICD-10-CM

## 2024-06-11 DIAGNOSIS — I5A Non-ischemic myocardial injury (non-traumatic): Secondary | ICD-10-CM | POA: Diagnosis present

## 2024-06-11 DIAGNOSIS — Z87891 Personal history of nicotine dependence: Secondary | ICD-10-CM

## 2024-06-11 DIAGNOSIS — Z8249 Family history of ischemic heart disease and other diseases of the circulatory system: Secondary | ICD-10-CM

## 2024-06-11 DIAGNOSIS — Z79890 Hormone replacement therapy (postmenopausal): Secondary | ICD-10-CM | POA: Diagnosis not present

## 2024-06-11 DIAGNOSIS — I739 Peripheral vascular disease, unspecified: Secondary | ICD-10-CM | POA: Diagnosis present

## 2024-06-11 DIAGNOSIS — E66811 Obesity, class 1: Secondary | ICD-10-CM | POA: Diagnosis present

## 2024-06-11 DIAGNOSIS — I272 Pulmonary hypertension, unspecified: Secondary | ICD-10-CM | POA: Diagnosis present

## 2024-06-11 DIAGNOSIS — Z751 Person awaiting admission to adequate facility elsewhere: Secondary | ICD-10-CM

## 2024-06-11 DIAGNOSIS — E039 Hypothyroidism, unspecified: Secondary | ICD-10-CM | POA: Diagnosis present

## 2024-06-11 DIAGNOSIS — Z79899 Other long term (current) drug therapy: Secondary | ICD-10-CM

## 2024-06-11 DIAGNOSIS — Z66 Do not resuscitate: Secondary | ICD-10-CM | POA: Diagnosis present

## 2024-06-11 DIAGNOSIS — Z7984 Long term (current) use of oral hypoglycemic drugs: Secondary | ICD-10-CM | POA: Diagnosis not present

## 2024-06-11 DIAGNOSIS — J449 Chronic obstructive pulmonary disease, unspecified: Secondary | ICD-10-CM | POA: Diagnosis present

## 2024-06-11 DIAGNOSIS — I4892 Unspecified atrial flutter: Secondary | ICD-10-CM | POA: Diagnosis not present

## 2024-06-11 DIAGNOSIS — R001 Bradycardia, unspecified: Secondary | ICD-10-CM | POA: Diagnosis present

## 2024-06-11 DIAGNOSIS — G7000 Myasthenia gravis without (acute) exacerbation: Secondary | ICD-10-CM | POA: Diagnosis present

## 2024-06-11 DIAGNOSIS — Z85821 Personal history of Merkel cell carcinoma: Secondary | ICD-10-CM

## 2024-06-11 DIAGNOSIS — J439 Emphysema, unspecified: Secondary | ICD-10-CM | POA: Diagnosis not present

## 2024-06-11 DIAGNOSIS — E7800 Pure hypercholesterolemia, unspecified: Secondary | ICD-10-CM | POA: Diagnosis present

## 2024-06-11 DIAGNOSIS — Z8546 Personal history of malignant neoplasm of prostate: Secondary | ICD-10-CM

## 2024-06-11 DIAGNOSIS — E669 Obesity, unspecified: Secondary | ICD-10-CM | POA: Diagnosis not present

## 2024-06-11 DIAGNOSIS — I10 Essential (primary) hypertension: Secondary | ICD-10-CM | POA: Diagnosis present

## 2024-06-11 DIAGNOSIS — I5033 Acute on chronic diastolic (congestive) heart failure: Principal | ICD-10-CM | POA: Diagnosis present

## 2024-06-11 DIAGNOSIS — I4819 Other persistent atrial fibrillation: Secondary | ICD-10-CM | POA: Diagnosis present

## 2024-06-11 DIAGNOSIS — Z6831 Body mass index (BMI) 31.0-31.9, adult: Secondary | ICD-10-CM | POA: Diagnosis not present

## 2024-06-11 DIAGNOSIS — L03119 Cellulitis of unspecified part of limb: Secondary | ICD-10-CM | POA: Diagnosis not present

## 2024-06-11 DIAGNOSIS — L03116 Cellulitis of left lower limb: Secondary | ICD-10-CM | POA: Diagnosis present

## 2024-06-11 DIAGNOSIS — I071 Rheumatic tricuspid insufficiency: Secondary | ICD-10-CM | POA: Diagnosis present

## 2024-06-11 DIAGNOSIS — I130 Hypertensive heart and chronic kidney disease with heart failure and stage 1 through stage 4 chronic kidney disease, or unspecified chronic kidney disease: Secondary | ICD-10-CM | POA: Diagnosis present

## 2024-06-11 DIAGNOSIS — Z881 Allergy status to other antibiotic agents status: Secondary | ICD-10-CM

## 2024-06-11 DIAGNOSIS — Z85828 Personal history of other malignant neoplasm of skin: Secondary | ICD-10-CM

## 2024-06-11 DIAGNOSIS — Z823 Family history of stroke: Secondary | ICD-10-CM

## 2024-06-11 DIAGNOSIS — Z885 Allergy status to narcotic agent status: Secondary | ICD-10-CM

## 2024-06-11 DIAGNOSIS — Z981 Arthrodesis status: Secondary | ICD-10-CM

## 2024-06-11 LAB — CBC WITH DIFFERENTIAL/PLATELET
Abs Immature Granulocytes: 0.1 K/uL — ABNORMAL HIGH (ref 0.00–0.07)
Basophils Absolute: 0 K/uL (ref 0.0–0.1)
Basophils Relative: 0 %
Eosinophils Absolute: 0.1 K/uL (ref 0.0–0.5)
Eosinophils Relative: 1 %
HCT: 35.9 % — ABNORMAL LOW (ref 39.0–52.0)
Hemoglobin: 11.5 g/dL — ABNORMAL LOW (ref 13.0–17.0)
Immature Granulocytes: 1 %
Lymphocytes Relative: 9 %
Lymphs Abs: 0.8 K/uL (ref 0.7–4.0)
MCH: 34.7 pg — ABNORMAL HIGH (ref 26.0–34.0)
MCHC: 32 g/dL (ref 30.0–36.0)
MCV: 108.5 fL — ABNORMAL HIGH (ref 80.0–100.0)
Monocytes Absolute: 1.2 K/uL — ABNORMAL HIGH (ref 0.1–1.0)
Monocytes Relative: 14 %
Neutro Abs: 6.4 K/uL (ref 1.7–7.7)
Neutrophils Relative %: 75 %
Platelets: 256 K/uL (ref 150–400)
RBC: 3.31 MIL/uL — ABNORMAL LOW (ref 4.22–5.81)
RDW: 14.1 % (ref 11.5–15.5)
WBC: 8.5 K/uL (ref 4.0–10.5)
nRBC: 0 % (ref 0.0–0.2)

## 2024-06-11 LAB — TROPONIN I (HIGH SENSITIVITY): Troponin I (High Sensitivity): 35 ng/L — ABNORMAL HIGH (ref <18)

## 2024-06-11 LAB — COMPREHENSIVE METABOLIC PANEL WITH GFR
ALT: 5 U/L (ref 0–44)
AST: 28 U/L (ref 15–41)
Albumin: 3.8 g/dL (ref 3.5–5.0)
Alkaline Phosphatase: 70 U/L (ref 38–126)
Anion gap: 12 (ref 5–15)
BUN: 66 mg/dL — ABNORMAL HIGH (ref 8–23)
CO2: 28 mmol/L (ref 22–32)
Calcium: 8.9 mg/dL (ref 8.9–10.3)
Chloride: 99 mmol/L (ref 98–111)
Creatinine, Ser: 2.5 mg/dL — ABNORMAL HIGH (ref 0.61–1.24)
GFR, Estimated: 25 mL/min — ABNORMAL LOW (ref >60)
Glucose, Bld: 98 mg/dL (ref 70–99)
Potassium: 4.5 mmol/L (ref 3.5–5.1)
Sodium: 139 mmol/L (ref 135–145)
Total Bilirubin: 0.7 mg/dL (ref 0.0–1.2)
Total Protein: 7.2 g/dL (ref 6.5–8.1)

## 2024-06-11 LAB — PROTIME-INR
INR: 1.6 — ABNORMAL HIGH (ref 0.8–1.2)
Prothrombin Time: 19.6 s — ABNORMAL HIGH (ref 11.4–15.2)

## 2024-06-11 LAB — BRAIN NATRIURETIC PEPTIDE: B Natriuretic Peptide: 1037.8 pg/mL — ABNORMAL HIGH (ref 0.0–100.0)

## 2024-06-11 MED ORDER — ONDANSETRON HCL 4 MG/2ML IJ SOLN: 4.0000 mg | Freq: Three times a day (TID) | INTRAMUSCULAR | Status: DC | PRN

## 2024-06-11 MED ORDER — HYDRALAZINE HCL 20 MG/ML IJ SOLN: 5.0000 mg | INTRAMUSCULAR | Status: DC | PRN

## 2024-06-11 MED ORDER — DM-GUAIFENESIN ER 30-600 MG PO TB12: 1.0000 | ORAL_TABLET | Freq: Two times a day (BID) | ORAL | Status: DC | PRN

## 2024-06-11 MED ORDER — ALBUTEROL SULFATE HFA 108 (90 BASE) MCG/ACT IN AERS: 2.0000 | INHALATION_SPRAY | RESPIRATORY_TRACT | Status: DC | PRN

## 2024-06-11 MED ORDER — CARBIDOPA-LEVODOPA 25-100 MG PO TABS
1.0000 | ORAL_TABLET | Freq: Three times a day (TID) | ORAL | Status: DC
  Administered 2024-06-12 – 2024-06-20 (×27): 1 via ORAL
  Filled 2024-06-11 (×28): qty 1

## 2024-06-11 MED ORDER — FUROSEMIDE 10 MG/ML IJ SOLN
40.0000 mg | Freq: Once | INTRAMUSCULAR | Status: AC
  Administered 2024-06-11: 40 mg via INTRAVENOUS
  Filled 2024-06-11: qty 4

## 2024-06-11 MED ORDER — ACETAMINOPHEN 325 MG PO TABS: 650.0000 mg | ORAL_TABLET | Freq: Four times a day (QID) | ORAL | Status: DC | PRN

## 2024-06-11 NOTE — ED Triage Notes (Addendum)
 Pt come in via [facility] with complaints of increased weakness in his legs over the past couple days. PT has a history of COPD and CHF.  See first RN note

## 2024-06-11 NOTE — H&P (Signed)
 History and Physical    Francisco [name] FMW:[id number] DOB: 1946/07/19 DOA: 06/11/2024  Referring MD/NP/PA:   PCP: Fernande Ophelia JINNY DOUGLAS, MD   Patient coming from:  The patient is coming from home.     Chief Complaint:   HPI: Francisco [name] is a 78 y.o. male with medical history significant of      Data reviewed independently and ED Course: pt was found to have     ***       EKG: I have personally reviewed.  Not done in ED, will get one.   ***   Review of Systems:   General: no fevers, chills, no body weight gain, has poor appetite, has fatigue HEENT: no blurry vision, hearing changes or sore throat Respiratory: no dyspnea, coughing, wheezing CV: no chest pain, no palpitations GI: no nausea, vomiting, abdominal pain, diarrhea, constipation GU: no dysuria, burning on urination, increased urinary frequency, hematuria  Ext: no leg edema Neuro: no unilateral weakness, numbness, or tingling, no vision change or hearing loss Skin: no rash, no skin tear. MSK: No muscle spasm, no deformity, no limitation of range of movement in spin Heme: No easy bruising.  Travel history: No recent long distant travel.   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   Carotid stenosis    CHF (congestive heart failure) (HCC)    CKD (chronic kidney disease)    CKD (chronic kidney disease) stage 3, GFR 30-59 ml/min (HCC)    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   DDD (degenerative disc disease), cervical    Dysrhythmia    post carotid stent bradycardia;  PAF 09/2020   GERD (gastroesophageal reflux disease)    Hypercholesterolemia    Hypertension    Hypothyroidism    pt takes Levothyroxine  daily   Lumbosacral spinal stenosis    Myasthenia gravis, adult form (HCC)    PAD (peripheral artery disease)    Parkinson disease (HCC)    Shortness of breath    Lung MD- Dr Burnett Servant   Sleep apnea    do not use CPAP every night   Stroke (HCC) 05/31/2023    Past Surgical History:  Procedure Laterality Date   ANTERIOR CERVICAL DECOMP/DISCECTOMY FUSION  07/18/2011   Procedure: ANTERIOR CERVICAL DECOMPRESSION/DISCECTOMY FUSION 2 LEVELS;  Surgeon: Victory LABOR Pool;  Location: [facility];  Service: Neurosurgery;  Laterality: N/A;  cervical five-six, cervical six-seven anterior cervical discectomy and fusion   ATRIAL FIBRILLATION ABLATION     BACK SURGERY     in 1985 [facility]   BILATERAL CARPAL TUNNEL RELEASE     01/2020 Right, 04/2020 Left   CARDIAC CATHETERIZATION     2005 at [facility], no stents   CARDIOVERSION N/A 02/22/2024   Procedure: CARDIOVERSION;  Surgeon: Alluri, Keller BROCKS, MD;  Location: [facility] OR[facility];  Service: Cardiovascular;  Laterality: N/A;   CAROTID PTA/STENT INTERVENTION N/A 09/17/2020   Procedure: CAROTID PTA/STENT INTERVENTION;  Surgeon: Marea Selinda RAMAN, MD;  Location: [facility];  Service: Cardiovascular;  Laterality: N/A;   CATARACT EXTRACTION W/PHACO Left 01/06/2020   Procedure: CATARACT EXTRACTION PHACO AND INTRAOCULAR LENS PLACEMENT (IOC) ISTENT INJ LEFT 3.81  00:33.3;  Surgeon: Myrna Adine Anes, MD;  Location: [facility];  Service: Ophthalmology;  Laterality: Left;   CATARACT EXTRACTION W/PHACO Right 02/03/2020   Procedure: CATARACT EXTRACTION PHACO AND INTRAOCULAR LENS PLACEMENT (IOC) RIGHT ISTENT INJ;  Surgeon: Myrna Adine Anes, MD;  Location: [facility];  Service: Ophthalmology;  Laterality: Right;  4.29 0:35.6   COLONOSCOPY     HERNIA REPAIR Left    inguinal hernia repair in 1985   LUMBAR  LAMINECTOMY/DECOMPRESSION MICRODISCECTOMY Left 02/24/2014   Procedure: LUMBAR LAMINECTOMY/DECOMPRESSION MICRODISCECTOMY LUMBAR THREE-FOUR, FOUR-FIVE, LEFT FIVE-SACRAL ONE ;  Surgeon: Victory DELENA Gunnels, MD;  Location: [facility];  Service: Neurosurgery;  Laterality: Left;  LUMBAR LAMINECTOMY/DECOMPRESSION MICRODISCECTOMY LUMBAR THREE-FOUR, FOUR-FIVE, LEFT FIVE-SACRAL ONE    LUMBAR LAMINECTOMY/DECOMPRESSION MICRODISCECTOMY N/A 05/03/2021   Procedure: Laminectomy and Foraminotomy - L2-L3;  Surgeon: Gunnels Victory, MD;  Location: [facility];  Service: Neurosurgery;  Laterality: N/A;  3C   POSTERIOR CERVICAL FUSION/FORAMINOTOMY N/A 08/07/2020   Procedure: C3-6 POSTERIOR FUSION WITH DECOMPRESSION;  Surgeon: Clois Fret, MD;  Location: [facility];  Service: Neurosurgery;  Laterality: N/A;   PROSTATECTOMY  04/2013   [facility] Dr ozell Geralds    TEE WITHOUT CARDIOVERSION N/A 02/22/2024   Procedure: ECHOCARDIOGRAM, TRANSESOPHAGEAL;  Surgeon: Alluri, Keller BROCKS, MD;  Location: [facility];  Service: Cardiovascular;  Laterality: N/A;    Social History:  reports that he quit smoking about 22 years ago. His smoking use included cigarettes. He started smoking about 42 years ago. He has a 20 pack-year smoking history. He has never used smokeless tobacco. He reports current alcohol use of about 3.0 standard drinks of alcohol per week. He reports that he does not use drugs.  Family History:  Family History  Problem Relation Age of Onset   Hypertension Mother    Stroke Mother    Stroke Father      Prior to Admission medications   Medication Sig Start Date End Date Taking? Authorizing Provider  acetaminophen  (TYLENOL ) 325 MG tablet Take 2 tablets (650 mg total) by mouth every 6 (six) hours as needed for mild pain (pain score 1-3) or fever (or Fever >/= 101). 05/22/24   Jens Durand, MD  amiodarone  (PACERONE ) 200 MG tablet Take 1 tablet (200 mg total) by mouth daily. 05/22/24   Jens Durand, MD  apixaban  (ELIQUIS ) 5 MG TABS  tablet Take 5 mg by mouth 2 (two) times daily.    [provider]  azaTHIOprine  (IMURAN ) 50 MG tablet Take 150 mg by mouth daily.     [provider]  bisacodyl  (DULCOLAX) 5 MG EC tablet Take 2 tablets (10 mg total) by mouth at bedtime as needed for moderate constipation. 03/06/24   Von Bellis, MD  bumetanide  (BUMEX ) 2 MG tablet Take 2 mg by mouth daily.    [provider]  carbidopa -levodopa  (SINEMET  IR) 25-100 MG tablet Take 1 tablet by mouth 3 (three) times daily. 11/11/21   [provider]  Cholecalciferol  (D3-1000) 25 MCG (1000 UT) capsule Take 2,000 Units by mouth daily.    [provider]  cyanocobalamin  (VITAMIN B12) 1000 MCG tablet Take 1,000 mcg by mouth daily.    [provider]  dapagliflozin  propanediol (FARXIGA ) 10 MG TABS tablet Take 1 tablet (10 mg total) by mouth daily. 03/07/24 03/07/25  Von Bellis,  MD  HYDROcodone -acetaminophen  (NORCO) 10-325 MG tablet Take 1-2 tablets by mouth every 6 (six) hours as needed for moderate pain (pain score 4-6) or severe pain (pain score 7-10). 05/22/24   Jens Durand, MD  levothyroxine  (SYNTHROID ) 50 MCG tablet Take 1 tablet (50 mcg total) by mouth daily at 6 (six) AM. 01/01/22 07/31/24  Trudy Anthony HERO, MD  meclizine  (ANTIVERT ) 25 MG tablet Take 1 tablet by mouth 3 (three) times daily as needed for dizziness. 07/31/23   [provider]  melatonin 3 MG TABS tablet Take 1 tablet (3 mg total) by mouth at bedtime. 03/06/24   Von Bellis, MD  polyethylene glycol (MIRALAX  / GLYCOLAX ) 17 g packet Take 17 g by mouth daily. Skip the dose if no constipation 03/06/24   Von Bellis, MD  predniSONE  (DELTASONE ) 10 MG tablet Take 10 mg by mouth every Monday, Wednesday, and Friday.    [provider]  pregabalin  (LYRICA ) 50 MG capsule Take 50 mg by mouth 2 (two) times daily.    [provider]  spironolactone  (ALDACTONE ) 25 MG tablet Take 1 tablet (25 mg total) by mouth daily.  03/07/24 03/07/25  Von Bellis, MD    Physical Exam: Vitals:   06/11/24 2230 06/11/24 2245 06/11/24 2315 06/11/24 2330  BP: 126/86  (!) 138/59   Pulse: (!) 49 (!) 58 (!) 51 (!) 51  Resp: (!) 30 19 20 18   Temp:   97.7 F (36.5 C)   TempSrc:   Oral   SpO2:  92% 99% 100%  Weight:      Height:       General: Not in acute distress HEENT:       Eyes: PERRL, EOMI, no jaundice       ENT: No discharge from the ears and nose, no pharynx injection, no tonsillar enlargement.        Neck: No JVD, no bruit, no mass felt. Heme: No neck lymph node enlargement. Cardiac: S1/S2, RRR, No murmurs, No gallops or rubs. Respiratory: No rales, wheezing, rhonchi or rubs. GI: Soft, nondistended, nontender, no rebound pain, no organomegaly, BS present. GU: No hematuria Ext: No pitting leg edema bilaterally. 1+DP/PT pulse bilaterally. Musculoskeletal: No joint deformities, No joint redness or warmth, no limitation of ROM in spin. Skin: No rashes.  Neuro: Alert, oriented X3, cranial nerves II-XII grossly intact, moves all extremities normally. Muscle strength 5/5 in all extremities, sensation to light touch intact. Brachial reflex 2+ bilaterally. Knee reflex 1+ bilaterally. Negative Babinski's sign. Normal finger to nose test. Psych: Patient is not psychotic, no suicidal or hemocidal ideation.  Labs on Admission: I have personally reviewed following labs and imaging studies  CBC: Recent Labs  Lab 06/11/24 1803  WBC 8.5  NEUTROABS 6.4  HGB 11.5*  HCT 35.9*  MCV 108.5*  PLT 256   Basic Metabolic Panel: Recent Labs  Lab 06/11/24 1803  NA 139  K 4.5  CL 99  CO2 28  GLUCOSE 98  BUN 66*  CREATININE 2.50*  CALCIUM  8.9   GFR: Estimated Creatinine Clearance: 29.5 mL/min (A) (by C-G formula based on SCr of 2.5 mg/dL (H)). Liver Function Tests: Recent Labs  Lab 06/11/24 1803  AST 28  ALT 5  ALKPHOS 70  BILITOT 0.7  PROT 7.2  ALBUMIN  3.8   No results for input(s): LIPASE, AMYLASE in  the last 168 hours. No results for input(s): AMMONIA in the last 168 hours. Coagulation Profile: Recent Labs  Lab 06/11/24 1803  INR 1.6*   Cardiac  Enzymes: No results for input(s): CKTOTAL, CKMB, CKMBINDEX, TROPONINI in the last 168 hours. BNP (last 3 results) No results for input(s): PROBNP in the last 8760 hours. HbA1C: No results for input(s): HGBA1C in the last 72 hours. CBG: No results for input(s): GLUCAP in the last 168 hours. Lipid Profile: No results for input(s): CHOL, HDL, LDLCALC, TRIG, CHOLHDL, LDLDIRECT in the last 72 hours. Thyroid  Function Tests: No results for input(s): TSH, T4TOTAL, FREET4, T3FREE, THYROIDAB in the last 72 hours. Anemia Panel: No results for input(s): VITAMINB12, FOLATE, FERRITIN, TIBC, IRON , RETICCTPCT in the last 72 hours. Urine analysis:    Component Value Date/Time   COLORURINE YELLOW (A) 05/13/2023 1409   APPEARANCEUR CLEAR (A) 05/13/2023 1409   LABSPEC 1.025 05/13/2023 1409   PHURINE 5.5 05/13/2023 1409   GLUCOSEU NEGATIVE 05/13/2023 1409   HGBUR NEGATIVE 05/13/2023 1409   BILIRUBINUR NEGATIVE 05/13/2023 1409   KETONESUR 15 (A) 05/13/2023 1409   PROTEINUR NEGATIVE 05/13/2023 1409   NITRITE NEGATIVE 05/13/2023 1409   LEUKOCYTESUR NEGATIVE 05/13/2023 1409   Sepsis Labs: @LABRCNTIP (procalcitonin:4,lacticidven:4) )No results found for this or any previous visit (from the past 240 hours).   Radiological Exams on Admission:   Assessment/Plan Active Problems:   * No active hospital problems. *   Assessment and Plan: No notes have been filed under this hospital service. Service: Hospitalist      Active Problems:   * No active hospital problems. *    DVT ppx: SQ Heparin          SQ Lovenox   Code Status: Full code   ***  Family Communication:     not done, no family member is at bed side.              Yes, patient's    at bed side.       by phone   ***  Disposition  Plan:  Anticipate discharge back to previous environment  Consults called:    Admission status and Level of care: :    for obs as inpt        Dispo: The patient is from: {From:23814}              Anticipated d/c is to: {To:23815}              Anticipated d/c date is: {Days:23816}              Patient currently {Medically stable:23817}    Severity of Illness:  {Observation/Inpatient:21159}       Date of Service 06/11/2024    Caleb Exon [facility]   If 7PM-7AM, please contact night-coverage [email or web address] 06/11/2024, 11:51 PM

## 2024-06-11 NOTE — ED Provider Notes (Signed)
 [facility] Provider Note    Event Date/Time   First MD Initiated Contact with Patient 06/11/24 2055     (approximate)   History   No chief complaint on file.   HPI  Francisco [name] is a 78 y.o. male with a history of CHF, paroxysmal atrial fibrillation, myasthenia gravis, Parkinson disease and spinal stenosis who presents with generalized weakness for the last 2 weeks, with bilateral leg weakness that is worsened over the last week.  The left leg is weaker than the right.  Today he felt so weak that he was unable to get up off the toilet.  Reviewed the past medical records.  The patient was admitted to the hospital service earlier this month and discharged on 9/10, evaluated for frequent falls, lymphedema, and possible cellulitis.   Physical Exam   Triage Vital Signs: ED Triage Vitals  Encounter Vitals Group     BP 06/11/24 1756 118/66     Girls Systolic BP Percentile --      Girls Diastolic BP Percentile --      Boys Systolic BP Percentile --      Boys Diastolic BP Percentile --      Pulse Rate 06/11/24 1756 60     Resp 06/11/24 1756 17     Temp 06/11/24 1756 97.7 F (36.5 C)     Temp src --      SpO2 06/11/24 1756 98 %     Weight 06/11/24 1758 238 lb 1.6 oz (108 kg)     Height 06/11/24 1758 5' 10 (1.778 m)     Head Circumference --      Peak Flow --      Pain Score 06/11/24 1800 8     Pain Loc --      Pain Education --      Exclude from Growth Chart --     Most recent vital signs: Vitals:   06/11/24 1756  BP: 118/66  Pulse: 60  Resp: 17  Temp: 97.7 F (36.5 C)  SpO2: 98%    {Only need to document appropriate and relevant physical exam:1} General: Awake, no distress. *** CV:  Good peripheral perfusion. *** Resp:  Normal effort. *** Abd:  No distention. *** Other:  ***   ED Results / Procedures / Treatments   Labs (all labs ordered are listed, but only abnormal results are displayed) Labs Reviewed  CBC WITH  DIFFERENTIAL/PLATELET - Abnormal; Notable for the following components:      Result Value   RBC 3.31 (*)    Hemoglobin 11.5 (*)    HCT 35.9 (*)    MCV 108.5 (*)    MCH 34.7 (*)    Monocytes Absolute 1.2 (*)    Abs Immature Granulocytes 0.10 (*)    All other components within normal limits  COMPREHENSIVE METABOLIC PANEL WITH GFR - Abnormal; Notable for the following components:   BUN 66 (*)    Creatinine, Ser 2.50 (*)    GFR, Estimated 25 (*)    All other components within normal limits  BRAIN NATRIURETIC PEPTIDE - Abnormal; Notable for the following components:   B Natriuretic Peptide 1,037.8 (*)    All other components within normal limits  PROTIME-INR - Abnormal; Notable for the following components:   Prothrombin Time 19.6 (*)    INR 1.6 (*)    All other components within normal limits     EKG  ED ECG REPORT I, Waylon Cassis, the attending physician, personally  viewed and interpreted this ECG.  Date: 06/11/2024 EKG Time: 2016 Rate: 71 Rhythm: normal sinus rhythm QRS Axis: normal Intervals: Nonspecific IVCD ST/T Wave abnormalities: Specific T wave abnormalities Narrative Interpretation: no evidence of acute ischemia    RADIOLOGY *** {USE THE WORD INTERPRETED!! You MUST document your own interpretation of imaging, as well as the fact that you reviewed the radiologist's report!:1}   PROCEDURES:  Critical Care performed: {CriticalCareYesNo:19197::Yes, see critical care procedure note(s),No}  Procedures   MEDICATIONS ORDERED IN ED: Medications - No data to display   IMPRESSION / MDM / ASSESSMENT AND PLAN / ED COURSE  I reviewed the triage vital signs and the nursing notes.                              Differential diagnosis includes, but is not limited to, ***  Patient's presentation is most consistent with {EM COPA:27473}  *** {If the patient is on the monitor, remove the brackets and asterisks on the sentence below and remember to document  it as a Procedure as well. Otherwise delete the sentence below:1} {**The patient is on the cardiac monitor to evaluate for evidence of arrhythmia and/or significant heart rate changes.     FINAL CLINICAL IMPRESSION(S) / ED DIAGNOSES   Final diagnoses:  None     Rx / DC Orders   ED Discharge Orders     None        Note:  This document was prepared using Dragon voice recognition software and may include unintentional dictation errors.

## 2024-06-11 NOTE — ED Provider Triage Note (Signed)
 Emergency Medicine Provider Triage Evaluation Note  Francisco [name] , a 78 y.o. male  was evaluated in triage.  Pt complains of weakness in both lower legs that has been ongoing for the past several days but worse over the last 1 to 2 days.  He also complains of diffuse body aches.  No new injury in the lower extremities.  He has diffuse swelling but daughter reports that it seems to be less then usual since he has been on Lasix .    Physical Exam  BP 118/66   Pulse 60   Temp 97.7 F (36.5 C)   Resp 17   Ht 5' 10 (1.778 m)   Wt 108 kg   SpO2 98%   BMI 34.16 kg/m  Gen:   Awake, no distress   Resp:  Normal effort  MSK:   Moves extremities without difficulty  Other:    Medical Decision Making  Medically screening exam initiated at 6:01 PM.  Appropriate orders placed.  Francisco [name] was informed that the remainder of the evaluation will be completed by another provider, this initial triage assessment does not replace that evaluation, and the importance of remaining in the ED until their evaluation is complete.  Protocol started.   Herlinda Kirk NOVAK, FNP 06/11/24 2015

## 2024-06-11 NOTE — ED Triage Notes (Signed)
 First Nurse Note: Patient to ED via [facility] from [facility] for left leg numbness (ongoing since last week) and swelling all over body. Pt screaming in pain when touched per EMS. VS WNL

## 2024-06-12 ENCOUNTER — Inpatient Hospital Stay

## 2024-06-12 ENCOUNTER — Encounter: Payer: Self-pay | Admitting: Internal Medicine

## 2024-06-12 ENCOUNTER — Other Ambulatory Visit (HOSPITAL_COMMUNITY): Payer: Self-pay

## 2024-06-12 DIAGNOSIS — N179 Acute kidney failure, unspecified: Secondary | ICD-10-CM | POA: Diagnosis present

## 2024-06-12 DIAGNOSIS — I5A Non-ischemic myocardial injury (non-traumatic): Secondary | ICD-10-CM | POA: Diagnosis not present

## 2024-06-12 DIAGNOSIS — J439 Emphysema, unspecified: Secondary | ICD-10-CM

## 2024-06-12 DIAGNOSIS — Z8673 Personal history of transient ischemic attack (TIA), and cerebral infarction without residual deficits: Secondary | ICD-10-CM | POA: Diagnosis not present

## 2024-06-12 DIAGNOSIS — R29898 Other symptoms and signs involving the musculoskeletal system: Secondary | ICD-10-CM | POA: Diagnosis present

## 2024-06-12 DIAGNOSIS — I5033 Acute on chronic diastolic (congestive) heart failure: Secondary | ICD-10-CM | POA: Diagnosis not present

## 2024-06-12 DIAGNOSIS — I480 Paroxysmal atrial fibrillation: Secondary | ICD-10-CM | POA: Diagnosis not present

## 2024-06-12 DIAGNOSIS — L03119 Cellulitis of unspecified part of limb: Secondary | ICD-10-CM | POA: Diagnosis present

## 2024-06-12 LAB — BASIC METABOLIC PANEL WITH GFR
Anion gap: 9 (ref 5–15)
BUN: 65 mg/dL — ABNORMAL HIGH (ref 8–23)
CO2: 27 mmol/L (ref 22–32)
Calcium: 8.3 mg/dL — ABNORMAL LOW (ref 8.9–10.3)
Chloride: 104 mmol/L (ref 98–111)
Creatinine, Ser: 2.25 mg/dL — ABNORMAL HIGH (ref 0.61–1.24)
GFR, Estimated: 29 mL/min — ABNORMAL LOW (ref >60)
Glucose, Bld: 105 mg/dL — ABNORMAL HIGH (ref 70–99)
Potassium: 3.6 mmol/L (ref 3.5–5.1)
Sodium: 140 mmol/L (ref 135–145)

## 2024-06-12 LAB — CBC
HCT: 32.1 % — ABNORMAL LOW (ref 39.0–52.0)
Hemoglobin: 10.4 g/dL — ABNORMAL LOW (ref 13.0–17.0)
MCH: 35 pg — ABNORMAL HIGH (ref 26.0–34.0)
MCHC: 32.4 g/dL (ref 30.0–36.0)
MCV: 108.1 fL — ABNORMAL HIGH (ref 80.0–100.0)
Platelets: 227 K/uL (ref 150–400)
RBC: 2.97 MIL/uL — ABNORMAL LOW (ref 4.22–5.81)
RDW: 14.2 % (ref 11.5–15.5)
WBC: 7.6 K/uL (ref 4.0–10.5)
nRBC: 0.3 % — ABNORMAL HIGH (ref 0.0–0.2)

## 2024-06-12 LAB — MAGNESIUM: Magnesium: 2.6 mg/dL — ABNORMAL HIGH (ref 1.7–2.4)

## 2024-06-12 LAB — SEDIMENTATION RATE: Sed Rate: 70 mm/h — ABNORMAL HIGH (ref 0–20)

## 2024-06-12 LAB — C-REACTIVE PROTEIN: CRP: 2.2 mg/dL — ABNORMAL HIGH (ref <1.0)

## 2024-06-12 LAB — TROPONIN I (HIGH SENSITIVITY)
Troponin I (High Sensitivity): 33 ng/L — ABNORMAL HIGH (ref <18)
Troponin I (High Sensitivity): 26 ng/L — ABNORMAL HIGH (ref <18)

## 2024-06-12 MED ORDER — HYDROCODONE-ACETAMINOPHEN 10-325 MG PO TABS
1.0000 | ORAL_TABLET | Freq: Four times a day (QID) | ORAL | Status: DC | PRN
Start: 2024-06-12 — End: 2024-06-21
  Administered 2024-06-13 – 2024-06-14 (×2): 1 via ORAL
  Administered 2024-06-14 – 2024-06-18 (×8): 2 via ORAL
  Filled 2024-06-12 (×2): qty 2
  Filled 2024-06-12: qty 1
  Filled 2024-06-12 (×6): qty 2
  Filled 2024-06-12: qty 1

## 2024-06-12 MED ORDER — AZATHIOPRINE 50 MG PO TABS
150.0000 mg | ORAL_TABLET | Freq: Every day | ORAL | Status: DC
  Administered 2024-06-12 – 2024-06-20 (×8): 150 mg via ORAL
  Filled 2024-06-12 (×10): qty 3

## 2024-06-12 MED ORDER — MECLIZINE HCL 25 MG PO TABS: 25.0000 mg | ORAL_TABLET | Freq: Three times a day (TID) | ORAL | Status: DC | PRN

## 2024-06-12 MED ORDER — APIXABAN 5 MG PO TABS
5.0000 mg | ORAL_TABLET | Freq: Two times a day (BID) | ORAL | Status: DC
Start: 2024-06-12 — End: 2024-06-18
  Administered 2024-06-12 – 2024-06-18 (×13): 5 mg via ORAL
  Filled 2024-06-12 (×13): qty 1

## 2024-06-12 MED ORDER — DIPHENHYDRAMINE HCL 50 MG/ML IJ SOLN: 12.5000 mg | Freq: Three times a day (TID) | INTRAMUSCULAR | Status: DC | PRN

## 2024-06-12 MED ORDER — LEVOTHYROXINE SODIUM 50 MCG PO TABS
50.0000 ug | ORAL_TABLET | Freq: Every day | ORAL | Status: DC
  Administered 2024-06-12 – 2024-06-20 (×9): 50 ug via ORAL
  Filled 2024-06-12 (×9): qty 1

## 2024-06-12 MED ORDER — ALBUTEROL SULFATE (2.5 MG/3ML) 0.083% IN NEBU: 2.5000 mg | INHALATION_SOLUTION | RESPIRATORY_TRACT | Status: DC | PRN

## 2024-06-12 MED ORDER — SODIUM CHLORIDE 0.9 % IV SOLN
2.0000 g | INTRAVENOUS | Status: DC
  Administered 2024-06-12 – 2024-06-13 (×2): 2 g via INTRAVENOUS
  Filled 2024-06-12 (×2): qty 20

## 2024-06-12 MED ORDER — VITAMIN B-12 1000 MCG PO TABS
1000.0000 ug | ORAL_TABLET | Freq: Every day | ORAL | Status: DC
  Administered 2024-06-12 – 2024-06-20 (×9): 1000 ug via ORAL
  Filled 2024-06-12 (×10): qty 1

## 2024-06-12 MED ORDER — VITAMIN D 25 MCG (1000 UNIT) PO TABS
2000.0000 [IU] | ORAL_TABLET | Freq: Every day | ORAL | Status: DC
  Administered 2024-06-12 – 2024-06-20 (×9): 2000 [IU] via ORAL
  Filled 2024-06-12 (×9): qty 2

## 2024-06-12 MED ORDER — HYDROCORTISONE SOD SUC (PF) 100 MG IJ SOLR
100.0000 mg | Freq: Once | INTRAMUSCULAR | Status: AC
  Administered 2024-06-12: 100 mg via INTRAVENOUS
  Filled 2024-06-12: qty 2

## 2024-06-12 MED ORDER — PREDNISONE 10 MG PO TABS
10.0000 mg | ORAL_TABLET | ORAL | Status: DC
  Administered 2024-06-12 – 2024-06-19 (×4): 10 mg via ORAL
  Filled 2024-06-12 (×5): qty 1

## 2024-06-12 MED ORDER — FUROSEMIDE 10 MG/ML IJ SOLN
40.0000 mg | Freq: Two times a day (BID) | INTRAMUSCULAR | Status: DC
  Administered 2024-06-12 – 2024-06-16 (×10): 40 mg via INTRAVENOUS
  Filled 2024-06-12 (×10): qty 4

## 2024-06-12 MED ORDER — SPIRONOLACTONE 25 MG PO TABS
25.0000 mg | ORAL_TABLET | Freq: Every day | ORAL | Status: DC
  Administered 2024-06-12 – 2024-06-20 (×8): 25 mg via ORAL
  Filled 2024-06-12 (×9): qty 1

## 2024-06-12 NOTE — Assessment & Plan Note (Signed)
 Estimated body mass index is 31.79 kg/m as calculated from the following:   Height as of this encounter: 5' 10 (1.778 m).   Weight as of this encounter: 100.5 kg.   Patient with class I obesity. - Encouraged weight loss with exercise and healthy diet

## 2024-06-12 NOTE — Assessment & Plan Note (Signed)
-  Continue with home Synthroid 

## 2024-06-12 NOTE — Evaluation (Signed)
 Physical Therapy Evaluation Patient Details Name: Francisco [name] MRN: [id number] DOB: 27-Sep-1945 Today's Date: 06/12/2024  History of Present Illness  Pt is a 78 year old male presents with leg weakness, leg pain and swelling, admitted with Acute on chronic diastolic CHF, Possible cellulitis of lower extremity, Acute renal failure superimposed on stage 3a chronic kidney disease. PMH significant for A-fib on Eliquis , Parkinson's disease, HTN,  HLD, COPD, PVD, dCHD, stroke, hypothyroidism, bilateral carotid artery stenosis (s/p of stent of right carotid artery), myasthenia gravis, Merkel cell carcinoma.   Clinical Impression  Patient is agreeable to PT evaluation. He reports he has had increasing difficulty managing at the independent living facility with increased left leg weakness. He reports usually being able to ambulate a short distance with rolling walker but relying primarily on wheelchair for mobility. He has been to SNF in the past as well as outpatient PT.   Today the patient has left leg weakness compared to the right side. He was able to stand with +2 person assistance with left knee blocked to prevent buckling. Left knee buckles with weight bearing. He is unable to take any steps safety at this time. Recommend to continue PT to maximize independence. Anticipate the need for rehabilitation < 3 hours/day after this hospital stay.       If plan is discharge home, recommend the following: Two people to help with walking and/or transfers;A lot of help with bathing/dressing/bathroom;Help with stairs or ramp for entrance;Assistance with cooking/housework   Can travel by private vehicle   No    Equipment Recommendations None recommended by PT  Recommendations for Other Services       Functional Status Assessment Patient has had a recent decline in their functional status and demonstrates the ability to make significant improvements in function in a reasonable and predictable amount of  time.     Precautions / Restrictions Precautions Precautions: Fall Recall of Precautions/Restrictions: Intact Restrictions Weight Bearing Restrictions Per Provider Order: No      Mobility  Bed Mobility Overal bed mobility: Needs Assistance Bed Mobility: Supine to Sit, Sit to Supine     Supine to sit: Max assist, +2 for safety/equipment, HOB elevated Sit to supine: Max assist, +2 for physical assistance   General bed mobility comments: increased time and effort required. cues for sequencing    Transfers Overall transfer level: Needs assistance Equipment used: Rolling walker (2 wheels) Transfers: Sit to/from Stand Sit to Stand: Max assist, +2 safety/equipment, +2 physical assistance (L knee blocked to prevent buckling)                Ambulation/Gait               General Gait Details: unable to due to leg wekaness  Stairs            Wheelchair Mobility     Tilt Bed    Modified Rankin (Stroke Patients Only)       Balance Overall balance assessment: Needs assistance Sitting-balance support: Feet supported Sitting balance-Leahy Scale: Good     Standing balance support: Bilateral upper extremity supported, During functional activity, Reliant on assistive device for balance Standing balance-Leahy Scale: Poor Standing balance comment: heavy reliance on rolling walker and external support required                             Pertinent Vitals/Pain Pain Assessment Pain Assessment: Faces Faces Pain Scale: Hurts little more Pain Location: LLE  Pain Descriptors / Indicators: Discomfort, Grimacing, Aching Pain Intervention(s): Limited activity within patient's tolerance, Monitored during session, Repositioned    Home Living Family/patient expects to be discharged to::  [facility] ILF) Living Arrangements: Alone Available Help at Discharge: Available PRN/intermittently;Other (Comment) Type of Home: Independent living facility          Home Layout: One level Home Equipment: Shower seat;Grab bars - tub/shower;Grab bars - toilet;Hand held shower head;Wheelchair - Building surveyor;Wheelchair - Surveyor, quantity (2 wheels);Cane - single point Additional Comments: has been to short term rehab in the past as well as outpatient PT    Prior Function Prior Level of Function : Needs assist;History of Falls (last six months)             Mobility Comments: RW short distances, mostly power wheelchair. progressive LLE weakness with difficulty mobilizing at home ADLs Comments: MOD I for ADL, has assist for IADL; reports he still drives, has assist for meals and cleaning; recent continued decline in overall function. He could not get off the toilet the last few days.     Extremity/Trunk Assessment   Upper Extremity Assessment Upper Extremity Assessment: Generalized weakness    Lower Extremity Assessment Lower Extremity Assessment: LLE deficits/detail LLE Deficits / Details: unable to complete SLR. knee extension 2/5, dorsiflexion 2/5, hip flexor 1/5 LLE Sensation: decreased light touch       Communication   Communication Communication: No apparent difficulties    Cognition Arousal: Alert Behavior During Therapy: WFL for tasks assessed/performed   PT - Cognitive impairments: No apparent impairments                         Following commands: Intact       Cueing Cueing Techniques: Verbal cues, Tactile cues     General Comments General comments (skin integrity, edema, etc.): patient reports his left leg feels stronger today compared to yesterday.    Exercises     Assessment/Plan    PT Assessment Patient needs continued PT services  PT Problem List Decreased strength;Decreased range of motion;Decreased activity tolerance;Decreased balance;Decreased mobility;Decreased safety awareness;Decreased knowledge of use of DME;Impaired sensation       PT Treatment Interventions DME  instruction;Gait training;Functional mobility training;Therapeutic activities;Therapeutic exercise;Balance training;Neuromuscular re-education;Cognitive remediation;Patient/family education;Wheelchair mobility training    PT Goals (Current goals can be found in the Care Plan section)  Acute Rehab PT Goals Patient Stated Goal: to get stronger PT Goal Formulation: With patient Time For Goal Achievement: 06/26/24 Potential to Achieve Goals: Fair    Frequency Min 2X/week     Co-evaluation   Reason for Co-Treatment: Complexity of the patient's impairments (multi-system involvement);For patient/therapist safety PT goals addressed during session: Mobility/safety with mobility;Balance;Proper use of DME OT goals addressed during session: ADL's and self-care       AM-PAC PT 6 Clicks Mobility  Outcome Measure Help needed turning from your back to your side while in a flat bed without using bedrails?: A Lot Help needed moving from lying on your back to sitting on the side of a flat bed without using bedrails?: Total Help needed moving to and from a bed to a chair (including a wheelchair)?: Total Help needed standing up from a chair using your arms (e.g., wheelchair or bedside chair)?: Total Help needed to walk in hospital room?: Total Help needed climbing 3-5 steps with a railing? : Total 6 Click Score: 7    End of Session   Activity Tolerance: Patient tolerated treatment well  Patient left:  (seated on bed with nurse and student present) Nurse Communication: Mobility status (nurse present during session) PT Visit Diagnosis: Muscle weakness (generalized) (M62.81);Unsteadiness on feet (R26.81)    Time: 9090-9073 PT Time Calculation (min) (ACUTE ONLY): 17 min   Charges:   PT Evaluation $PT Eval Low Complexity: 1 Low   PT General Charges $$ ACUTE PT VISIT: 1 Visit         Randine Essex, PT, MPT   Randine LULLA Essex 06/12/2024, 11:09 AM

## 2024-06-12 NOTE — Progress Notes (Signed)
 Heart Failure Navigator Progress Note  Assessed for Heart & Vascular TOC clinic readiness.  Patient does not meet criteria due to current [facility] Cardiology patient.   Navigator will sign off at this time.  Roxy Horseman, RN, BSN [facility] Heart Failure Navigator Secure Chat Only

## 2024-06-12 NOTE — Evaluation (Signed)
 Occupational Therapy Evaluation Patient Details Name: Francisco [name] MRN: [id number] DOB: [birth year] Today's Date: 06/12/2024   History of Present Illness   Pt is a 78 year old male presents with leg weakness, leg pain and swelling, admitted with Acute on chronic diastolic CHF (congestive heart failure), Possible cellulitis of lower extremity, Acute renal failure superimposed on stage 3a chronic kidney disease (    PMH significant for A-fib on Eliquis , Parkinson's disease, HTN,  HLD, COPD, PVD, dCHD, stroke, hypothyroidism, bilateral carotid artery stenosis (s/p of stent of right carotid artery), myasthenia gravis, Merkel cell carcinoma     Clinical Impressions Chart reviewed to date, pt greeted semi supine in bed, alert and oriented x4. He reports after discharge from rehab, he has had a continued decrease in function/mobility. He amb short distances with RW, uses pwc primarily. He is able to perform ADLs with MOD I-I, but he reports he was recently unable to get off the toilet a few days ago. Pt endorses he reports he continues to feel weaker. Pt is performing ADL/functional mobility below PLOF, will benefit from acute OT to address deficits and to facilitate optimal ADL/functional mobility performance.      If plan is discharge home, recommend the following:   Help with stairs or ramp for entrance;Assistance with cooking/housework;Assist for transportation;Two people to help with walking and/or transfers;Two people to help with bathing/dressing/bathroom     Functional Status Assessment   Patient has had a recent decline in their functional status and demonstrates the ability to make significant improvements in function in a reasonable and predictable amount of time.     Equipment Recommendations   Other (comment) (defer to next venue of care)     Recommendations for Other Services         Precautions/Restrictions   Precautions Precautions: Fall Recall of  Precautions/Restrictions: Intact     Mobility Bed Mobility Overal bed mobility: Needs Assistance Bed Mobility: Supine to Sit     Supine to sit: Max assist, +2 for safety/equipment, HOB elevated Sit to supine: Max assist, +2 for physical assistance   General bed mobility comments: assist for trunk/BLE    Transfers Overall transfer level: Needs assistance Equipment used: Rolling walker (2 wheels) Transfers: Sit to/from Stand Sit to Stand: Max assist, +2 safety/equipment, +2 physical assistance (L Knee blocked)                  Balance Overall balance assessment: Needs assistance Sitting-balance support: Feet supported Sitting balance-Leahy Scale: Good     Standing balance support: Bilateral upper extremity supported, During functional activity, Reliant on assistive device for balance Standing balance-Leahy Scale: Poor                             ADL either performed or assessed with clinical judgement   ADL Overall ADL's : Needs assistance/impaired     Grooming: Sitting;Set up               Lower Body Dressing: Maximal assistance       Toileting- Clothing Manipulation and Hygiene: Maximal assistance;Sit to/from stand               Vision Patient Visual Report: No change from baseline       Perception         Praxis         Pertinent Vitals/Pain Pain Assessment Pain Assessment: Faces Faces Pain Scale: Hurts little more Pain Location: LLE  Pain Descriptors / Indicators: Discomfort, Grimacing, Aching Pain Intervention(s): Monitored during session, Repositioned     Extremity/Trunk Assessment Upper Extremity Assessment Upper Extremity Assessment: Generalized weakness   Lower Extremity Assessment Lower Extremity Assessment: LLE deficits/detail;Defer to PT evaluation       Communication Communication Communication: No apparent difficulties   Cognition Arousal: Alert Behavior During Therapy: WFL for tasks  assessed/performed Cognition: No apparent impairments                               Following commands: Intact       Cueing  General Comments   Cueing Techniques: Verbal cues;Tactile cues      Exercises Other Exercises Other Exercises: edu re role of OT, role of rehab, discharge recommendations   Shoulder Instructions      Home Living Family/patient expects to be discharged to::  [facility] ILF) Living Arrangements: Alone Available Help at Discharge: Available PRN/intermittently;Other (Comment) (ILF staff) Type of Home: Independent living facility       Home Layout: One level     Bathroom Shower/Tub: Producer, television/film/video: Handicapped height     Home Equipment: Shower seat;Grab bars - tub/shower;Grab bars - toilet;Hand held shower head;Wheelchair - Building surveyor;Wheelchair - Surveyor, quantity (2 wheels);Cane - single point          Prior Functioning/Environment Prior Level of Function : Needs assist;History of Falls (last six months)             Mobility Comments: RW short distances, mostly power wheelchair ADLs Comments: MOD I for ADL, has assist for IADL; reports he still drives, has assist for meals and cleaning; recent continued decline in overall function. He could not get off the toilet the last few days.    OT Problem List: Decreased range of motion;Decreased activity tolerance;Impaired balance (sitting and/or standing);Pain;Decreased knowledge of use of DME or AE   OT Treatment/Interventions: Self-care/ADL training;Therapeutic exercise;Patient/family education;Balance training;Energy conservation;Therapeutic activities;DME and/or AE instruction      OT Goals(Current goals can be found in the care plan section)   Acute Rehab OT Goals Patient Stated Goal: improve function OT Goal Formulation: With patient Time For Goal Achievement: 06/26/24 ADL Goals Pt Will Perform Grooming: with modified  independence;sitting;standing Pt Will Perform Lower Body Dressing: with modified independence;sitting/lateral leans;sit to/from stand Pt Will Transfer to Toilet: with modified independence;stand pivot transfer Pt Will Perform Toileting - Clothing Manipulation and hygiene: with modified independence;sitting/lateral leans;sit to/from stand   OT Frequency:  Min 2X/week    Co-evaluation PT/OT/SLP Co-Evaluation/Treatment: Yes Reason for Co-Treatment: Complexity of the patient's impairments (multi-system involvement);For patient/therapist safety   OT goals addressed during session: ADL's and self-care      AM-PAC OT 6 Clicks Daily Activity     Outcome Measure Help from another person eating meals?: None Help from another person taking care of personal grooming?: A Little Help from another person toileting, which includes using toliet, bedpan, or urinal?: A Lot Help from another person bathing (including washing, rinsing, drying)?: A Lot Help from another person to put on and taking off regular upper body clothing?: A Little Help from another person to put on and taking off regular lower body clothing?: A Lot 6 Click Score: 16   End of Session Equipment Utilized During Treatment: Rolling walker (2 wheels) Nurse Communication: Mobility status  Activity Tolerance: Patient tolerated treatment well Patient left: with call bell/phone within reach (sitting on edge of bed with  nursing)  OT Visit Diagnosis: Unsteadiness on feet (R26.81);Other abnormalities of gait and mobility (R26.89);Repeated falls (R29.6);Muscle weakness (generalized) (M62.81);Pain Pain - Right/Left: Left Pain - part of body: Leg                Time: 9090-9073 OT Time Calculation (min): 17 min Charges:  OT General Charges $OT Visit: 1 Visit OT Evaluation $OT Eval Moderate Complexity: 1 Mod  ,Therisa Sheffield, OTD OTR/L  06/12/24, 10:59 AM

## 2024-06-12 NOTE — Assessment & Plan Note (Signed)
 Most recent creatinine of 1.68 on 05/22/2024. It was 2.50 on admission, small improvement to 2.25 today.  Likely cardiorenal with concern of CHF exacerbation - Monitor renal function while he is being diuresed -Avoid nephrotoxin

## 2024-06-12 NOTE — Progress Notes (Signed)
 Progress Note   Patient: Francisco [name] FMW:[id number] DOB: 06-20-1946 DOA: 06/11/2024     1 DOS: the patient was seen and examined on 06/12/2024   Brief hospital course: Partly taken from H&P.  Francisco [name] is a 78 y.o. male with medical history significant of A-fib on Eliquis , Parkinson's disease, HTN,  HLD, COPD, PVD, dCHD, stroke, hypothyroidism, bilateral carotid artery stenosis (s/p of stent of right carotid artery), myasthenia gravis, Merkel cell carcinoma, who presents with bilateral leg weakness, leg pain and swelling.   He was also having left leg numbness for about 2 months.  On presentation stable vitals with heart rate ranging between 48-60, labs with BNP of 1037, INR 1.6, CXR with elevation of right hemidiaphragm. CT head was negative for any acute intracranial abnormality.  Lower extremity venous Doppler negative for DVT but did show Baker's cyst  Patient was admitted for acute on chronic HFpEF.  Started on IV Lasix .  Also started on ceftriaxone  for concern of lower extremity cellulitis  10/1: Vital stable, troponin 35>33>25, creatinine with some improvement to 2.25, preliminary blood cultures negative.  Mildly elevated CRP at 2.2 and elevated ESR at 70.  PT is recommending SNF.  Assessment and Plan: * Acute on chronic diastolic CHF (congestive heart failure) (HCC) Patient admitted with lower extremity edema and elevated BNP at 1037 consistent with CHF exacerbation.  Prior echo done in June 2025 with normal EF Small improvement in creatinine today. -Continue with IV Lasix  - Continue with spironolactone  -Daily weight and BMP -Strict intake and output  Myocardial injury Mildly elevated troponin at 35 with a downward trend on subsequent check.  No chest pain.  Likely demand ischemia with CHF exacerbation.  Paroxysmal atrial fibrillation [facility]) Patient has borderline bradycardia. - Keep holding home amiodarone  and metoprolol  -Continue with Eliquis   History of  stroke - On Eliquis  due to A-fib  Essential hypertension Blood pressure within lower goal. -Continue IV Lasix  and spironolactone   Myasthenia gravis (HCC) -Continue home prednisone  10 mg on Monday Wednesday Friday -  received Solu-Cortef 100 mg as a stress dose - Continue home azathioprine  50 mg daily  COPD (chronic obstructive pulmonary disease) (HCC) No acute concern. - Continue with as needed bronchodilators  Hypothyroidism - Continue with home Synthroid   Cellulitis of lower extremity Concern of bilateral lower extremity cellulitis with edema and erythema.  No leukocytosis, elevated CRP and ESR.  Preliminary blood cultures negative -Continue with ceftriaxone   Acute renal failure superimposed on stage 3a chronic kidney disease (HCC) Most recent creatinine of 1.68 on 05/22/2024. It was 2.50 on admission, small improvement to 2.25 today.  Likely cardiorenal with concern of CHF exacerbation - Monitor renal function while he is being diuresed -Avoid nephrotoxin  Parkinson disease (HCC) - Continue Sinemet   Leg weakness Patient has worsening bilateral leg weakness for more than 2 months, recently came home from rehab.  MRI brain with no acute intracranial abnormality.  Some progression of chronic small vessel disease. - PT is recommending SNF -TOC consult  Obesity (BMI 30-39.9) Estimated body mass index is 31.79 kg/m as calculated from the following:   Height as of this encounter: 5' 10 (1.778 m).   Weight as of this encounter: 100.5 kg.   Patient with class I obesity. - Encouraged weight loss with exercise and healthy diet   Subjective: Patient was resting comfortably when seen today.  He he continued to feel weak, complaining of bilateral leg pain.  Physical Exam: Vitals:   06/12/24 9955 06/12/24 9676 06/12/24 9256  06/12/24 1146  BP: (!) 147/55 (!) 110/47 (!) 114/50 (!) 106/50  Pulse: (!) 52 (!) 45 (!) 48 (!) 50  Resp: 18 20 16 20   Temp: (!) 97.5 F (36.4 C) 97.7  F (36.5 C) (!) 97.5 F (36.4 C) (!) 97.5 F (36.4 C)  TempSrc: Oral  Oral Oral  SpO2: 99% 97% 99% 91%  Weight: 100.5 kg     Height: 5' 10 (1.778 m)      General.  Ill-appearing elderly man, in no acute distress. Pulmonary.  Lungs clear bilaterally, normal respiratory effort. CV.  Regular rate and rhythm, no JVD, rub or murmur. Abdomen.  Soft, nontender, nondistended, BS positive. CNS.  Alert and oriented .  No focal neurologic deficit. Extremities.  1+ LE edema bilaterally with bilateral erythema, pulses intact and symmetrical.  Data Reviewed: Prior data reviewed  Family Communication: Discussed with 2 daughters at bedside  Disposition: Status is: Inpatient Remains inpatient appropriate because: Severity of illness  Planned Discharge Destination: Skilled nursing facility  DVT prophylaxis.  Eliquis  Time spent: 50 minutes  This record has been created using Conservation officer, historic buildings. Errors have been sought and corrected,but may not always be located. Such creation errors do not reflect on the standard of care.   Author: Amaryllis Dare, MD 06/12/2024 3:22 PM  For on call review [email or web address].

## 2024-06-12 NOTE — Assessment & Plan Note (Signed)
-   On Eliquis  due to A-fib

## 2024-06-12 NOTE — Assessment & Plan Note (Signed)
 Patient has borderline bradycardia. - Keep holding home amiodarone  and metoprolol  -Continue with Eliquis 

## 2024-06-12 NOTE — Assessment & Plan Note (Signed)
-  Continue home prednisone  10 mg on Monday Wednesday Friday -  received Solu-Cortef 100 mg as a stress dose - Continue home azathioprine  50 mg daily

## 2024-06-12 NOTE — Assessment & Plan Note (Signed)
 Blood pressure within lower goal. -Continue IV Lasix  and spironolactone 

## 2024-06-12 NOTE — Hospital Course (Addendum)
 Partly taken from H&P.  Francisco [name] is a 78 y.o. male with medical history significant of A-fib on Eliquis , Parkinson's disease, HTN,  HLD, COPD, PVD, dCHD, stroke, hypothyroidism, bilateral carotid artery stenosis (s/p of stent of right carotid artery), myasthenia gravis, Merkel cell carcinoma, who presents with bilateral leg weakness, leg pain and swelling.   He was also having left leg numbness for about 2 months.  On presentation stable vitals with heart rate ranging between 48-60, labs with BNP of 1037, INR 1.6, CXR with elevation of right hemidiaphragm. CT head was negative for any acute intracranial abnormality.  Lower extremity venous Doppler negative for DVT but did show Baker's cyst  Patient was admitted for acute on chronic HFpEF.  Started on IV Lasix .  Also started on ceftriaxone  for concern of lower extremity cellulitis  10/1: Vital stable, troponin 35>33>25, creatinine with some improvement to 2.25, preliminary blood cultures negative.  Mildly elevated CRP at 2.2 and elevated ESR at 70.  PT is recommending SNF.  10/2: Hemodynamically stable, creatinine improving now at 2.01.  Improving lower extremity cellulitis so antibiotics switched with cefadroxil. Continuing IV Lasix  and TOC is looking for placement.  10/3: Remained hemodynamically stable, renal function continued to improve, will continue with IV Lasix  while TOC is working on placement.  10/4: Hemodynamically stable, renal function continue to improve so continuing IV diuresis.  Medically stable to go to rehab.  10/5: Remained hemodynamically stable and kidney functions continue to improve now creatinine at 1.41.  Awaiting SNF placement  10/6: Hemodynamically stable, small increase in creatinine, switching him from IV Lasix  to torsemide .  Still pending disposition.  10/7: Obtained insurance authorization for rehab but patient developed a flutter between 1 conduction with RVR, cardiology was consulted and he was  given loading dose of amiodarone .  Later also developed gross hematuria so holding Eliquis  for now and monitoring CBC.  10/8: Vitals with soft blood pressure at 85/41 this morning requiring 500 cc bolus, blood pressure improved.  Worsening renal function with creatinine at 2.38 from 1.53 today.  Holding diuretic. No further hematuria, hemoglobin at 11.8.  10/9: Remained hemodynamically stable with heart rate mostly in 50s.  Eliquis  was resumed and there was no further bleeding noted.  Creatinine with some improvement to 2.11 so give some more IV fluid.  Case was discussed with cardiology and we we will hold torsemide , amiodarone  and metoprolol , cardiology will follow-up closely and will restart these medications when appropriate.  They also made an appointment with EP for later this month for further evaluation of his A-fib.  Please hold Eliquis  if you notice any abnormal bleeding.  Patient is being discharged to SNF for further rehab.  He will continue on current medications and need to have a close follow-up with his providers for further assistance.

## 2024-06-12 NOTE — Assessment & Plan Note (Signed)
 Concern of bilateral lower extremity cellulitis with edema and erythema.  No leukocytosis, elevated CRP and ESR.  Preliminary blood cultures negative -Continue with ceftriaxone 

## 2024-06-12 NOTE — Assessment & Plan Note (Signed)
 Patient has worsening bilateral leg weakness for more than 2 months, recently came home from rehab.  MRI brain with no acute intracranial abnormality.  Some progression of chronic small vessel disease. - PT is recommending SNF -TOC consult

## 2024-06-12 NOTE — Assessment & Plan Note (Signed)
 Mildly elevated troponin at 35 with a downward trend on subsequent check.  No chest pain.  Likely demand ischemia with CHF exacerbation.

## 2024-06-12 NOTE — Assessment & Plan Note (Signed)
-

## 2024-06-12 NOTE — Assessment & Plan Note (Signed)
 No acute concern. - Continue with as needed bronchodilators

## 2024-06-12 NOTE — Assessment & Plan Note (Signed)
 Patient admitted with lower extremity edema and elevated BNP at 1037 consistent with CHF exacerbation.  Prior echo done in June 2025 with normal EF Small improvement in creatinine today. -Continue with IV Lasix  - Continue with spironolactone  -Daily weight and BMP -Strict intake and output

## 2024-06-13 ENCOUNTER — Inpatient Hospital Stay: Admit: 2024-06-13 | Discharge: 2024-06-13 | Disposition: A | Attending: Internal Medicine

## 2024-06-13 DIAGNOSIS — I5033 Acute on chronic diastolic (congestive) heart failure: Secondary | ICD-10-CM | POA: Diagnosis not present

## 2024-06-13 DIAGNOSIS — Z8673 Personal history of transient ischemic attack (TIA), and cerebral infarction without residual deficits: Secondary | ICD-10-CM | POA: Diagnosis not present

## 2024-06-13 DIAGNOSIS — I5A Non-ischemic myocardial injury (non-traumatic): Secondary | ICD-10-CM | POA: Diagnosis not present

## 2024-06-13 DIAGNOSIS — I480 Paroxysmal atrial fibrillation: Secondary | ICD-10-CM | POA: Diagnosis not present

## 2024-06-13 LAB — BASIC METABOLIC PANEL WITH GFR
Anion gap: 10 (ref 5–15)
BUN: 55 mg/dL — ABNORMAL HIGH (ref 8–23)
CO2: 30 mmol/L (ref 22–32)
Calcium: 8.6 mg/dL — ABNORMAL LOW (ref 8.9–10.3)
Chloride: 101 mmol/L (ref 98–111)
Creatinine, Ser: 2.01 mg/dL — ABNORMAL HIGH (ref 0.61–1.24)
GFR, Estimated: 33 mL/min — ABNORMAL LOW (ref >60)
Glucose, Bld: 88 mg/dL (ref 70–99)
Potassium: 3.8 mmol/L (ref 3.5–5.1)
Sodium: 141 mmol/L (ref 135–145)

## 2024-06-13 MED ORDER — CEFADROXIL 500 MG PO CAPS
500.0000 mg | ORAL_CAPSULE | Freq: Two times a day (BID) | ORAL | Status: AC
  Administered 2024-06-13 – 2024-06-18 (×11): 500 mg via ORAL
  Filled 2024-06-13 (×11): qty 1

## 2024-06-13 NOTE — TOC Initial Note (Signed)
 Transition of Care [facility]) - Initial/Assessment Note    Patient Details  Name: Francisco [name] MRN: [id number] Date of Birth: 01-02-1946  [facility]) CM/SW Contact:    Alfonso Rummer, LCSW Phone Number: 06/13/2024, 3:50 PM  Clinical Narrative:                 KEN DELENA Rummer met with patient in room 240. [name] was informed regarding skilled nursing recommendations. Mr Philips reports he will not return to [facility]. Pt informed I will begin bed search and not include [name].         Patient Goals and CMS Choice            Expected Discharge Plan and Services                                              Prior Living Arrangements/Services                       Activities of Daily Living   ADL Screening (condition at time of admission) Independently performs ADLs?: No Does the patient have a NEW difficulty with bathing/dressing/toileting/self-feeding that is expected to last >3 days?: Yes (Initiates electronic notice to provider for possible OT consult) Does the patient have a NEW difficulty with getting in/out of bed, walking, or climbing stairs that is expected to last >3 days?: Yes (Initiates electronic notice to provider for possible PT consult) Does the patient have a NEW difficulty with communication that is expected to last >3 days?: No Is the patient deaf or have difficulty hearing?: No Does the patient have difficulty seeing, even when wearing glasses/contacts?: No Does the patient have difficulty concentrating, remembering, or making decisions?: No  Permission Sought/Granted                  Emotional Assessment              Admission diagnosis:  Acute on chronic diastolic CHF (congestive heart failure) (HCC) [I50.33] Patient Active Problem List   Diagnosis Date Noted   Acute renal failure superimposed on stage 3a chronic kidney disease (HCC) 06/12/2024   Leg weakness 06/12/2024   Cellulitis of  lower extremity 06/12/2024   Myocardial injury 06/12/2024   AKI (acute kidney injury) 05/22/2024   Frequent falls 05/17/2024   Acute on chronic diastolic CHF (congestive heart failure) (HCC) 02/27/2024   Acute diastolic CHF (congestive heart failure) (HCC) 02/27/2024   Lower extremity cellulitis 02/27/2024   History of stroke 05/31/2023   Obesity (BMI 30-39.9) 05/31/2023   HLD (hyperlipidemia) 05/31/2023   Chronic diastolic CHF (congestive heart failure) (HCC) 05/31/2023   Hyperkalemia 05/31/2023   SVT (supraventricular tachycardia) 12/30/2021   Hypothyroidism 12/30/2021   Parkinson disease (HCC) 12/30/2021   Elevated troponin 12/30/2021   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   Acquired thrombophilia 01/13/2021   History of decompression of median nerve 01/01/2021   Paroxysmal atrial fibrillation (HCC) 10/06/2020   Carotid stenosis, right 09/17/2020   S/P cervical spinal fusion    Leukocytosis    Essential hypertension    Anemia of chronic disease    Postoperative pain    Neuropathic pain    Cervical myelopathy (HCC) 08/07/2020   Preop cardiovascular exam 07/17/2020   SOB (shortness of breath) on exertion 07/17/2020   Leg weakness, bilateral 07/13/2020  Aortic atherosclerosis 07/09/2020   Body mass index (BMI) 34.0-34.9, adult 12/25/2019   Myalgia 10/30/2019   Lumbar post-laminectomy syndrome 10/24/2019   PAD (peripheral artery disease) 06/06/2019   CKD (chronic kidney disease) stage 3, GFR 30-59 ml/min (HCC) 04/29/2019   B12 deficiency 01/23/2019   Left arm numbness 02/28/2018   Neck pain 02/28/2018   Macrocytic anemia 02/02/2017   DDD (degenerative disc disease), cervical 02/02/2017   Hypothyroid 02/02/2017   MRSA (methicillin resistant staph aureus) culture positive 02/02/2017   Nocturnal hypoxia 02/02/2017   Senile purpura 10/03/2016   Essential hypertension, benign 09/16/2016   Bilateral carotid artery disease 09/16/2016   Facet arthritis of  lumbar region 03/18/2016   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   History of prostate cancer 12/21/2015   Left carpal tunnel syndrome 11/11/2015   Kidney stone on left side 07/05/2015   Myasthenia gravis (HCC) 05/26/2015   Radiculitis 01/05/2015   Long-term use of high-risk medication 10/15/2014   Persistent cough 09/10/2014   Pure hypercholesterolemia 07/25/2014   Spinal stenosis, lumbar region, with neurogenic claudication 02/24/2014   Lumbosacral stenosis with neurogenic claudication 02/24/2014   COPD (chronic obstructive pulmonary disease) (HCC) 01/22/2014   PCP:  Fernande Ophelia JINNY DOUGLAS, MD Pharmacy:   [id number] [address], [place] - [address] AT [facility] [address] [place] [address] Phone: [phone] Fax: [phone]     Social Drivers of Health (SDOH) Social History: SDOH Screenings   Food Insecurity: No Food Insecurity (06/12/2024)  Housing: Low Risk  (06/12/2024)  Transportation Needs: No Transportation Needs (06/12/2024)  Recent Concern: Transportation Needs - Unmet Transportation Needs (05/17/2024)  Utilities: Not At Risk (06/12/2024)  Depression (PHQ2-9): Low Risk  (08/30/2021)  Financial Resource Strain: Low Risk  (04/30/2024)   Received from [facility]  Social Connections: Moderately Isolated (06/12/2024)  Tobacco Use: Medium Risk (06/12/2024)   SDOH Interventions:     Readmission Risk Interventions     No data to display

## 2024-06-13 NOTE — Plan of Care (Signed)
   Problem: Education: Goal: Knowledge of General Education information will improve Description: Including pain rating scale, medication(s)/side effects and non-pharmacologic comfort measures Outcome: Progressing   Problem: Clinical Measurements: Goal: Respiratory complications will improve Outcome: Progressing   Problem: Nutrition: Goal: Adequate nutrition will be maintained Outcome: Progressing

## 2024-06-13 NOTE — Progress Notes (Signed)
 Progress Note   Patient: Francisco [name] FMW:[id number] DOB: 1946-02-20 DOA: 06/11/2024     2 DOS: the patient was seen and examined on 06/13/2024   Brief hospital course: Partly taken from H&P.  Francisco [name] is a 78 y.o. male with medical history significant of A-fib on Eliquis , Parkinson's disease, HTN,  HLD, COPD, PVD, dCHD, stroke, hypothyroidism, bilateral carotid artery stenosis (s/p of stent of right carotid artery), myasthenia gravis, Merkel cell carcinoma, who presents with bilateral leg weakness, leg pain and swelling.   He was also having left leg numbness for about 2 months.  On presentation stable vitals with heart rate ranging between 48-60, labs with BNP of 1037, INR 1.6, CXR with elevation of right hemidiaphragm. CT head was negative for any acute intracranial abnormality.  Lower extremity venous Doppler negative for DVT but did show Baker's cyst  Patient was admitted for acute on chronic HFpEF.  Started on IV Lasix .  Also started on ceftriaxone  for concern of lower extremity cellulitis  10/1: Vital stable, troponin 35>33>25, creatinine with some improvement to 2.25, preliminary blood cultures negative.  Mildly elevated CRP at 2.2 and elevated ESR at 70.  PT is recommending SNF.  10/2: Hemodynamically stable, creatinine improving now at 2.01.  Improving lower extremity cellulitis so antibiotics switched with cefadroxil. Continuing IV Lasix  and TOC is looking for placement.  Assessment and Plan: * Acute on chronic diastolic CHF (congestive heart failure) (HCC) Patient admitted with lower extremity edema and elevated BNP at 1037 consistent with CHF exacerbation.  Prior echo done in June 2025 with normal EF Small improvement in creatinine today. -Continue with IV Lasix  - Continue with spironolactone  -Daily weight and BMP -Strict intake and output  Myocardial injury Mildly elevated troponin at 35 with a downward trend on subsequent check.  No chest pain.  Likely  demand ischemia with CHF exacerbation.  Paroxysmal atrial fibrillation [facility]) Patient has borderline bradycardia. - Keep holding home amiodarone  and metoprolol  -Continue with Eliquis   History of stroke - On Eliquis  due to A-fib  Essential hypertension Blood pressure within lower goal. -Continue IV Lasix  and spironolactone   Myasthenia gravis (HCC) -Continue home prednisone  10 mg on Monday Wednesday Friday -  received Solu-Cortef 100 mg as a stress dose - Continue home azathioprine  50 mg daily  COPD (chronic obstructive pulmonary disease) (HCC) No acute concern. - Continue with as needed bronchodilators  Hypothyroidism - Continue with home Synthroid   Cellulitis of lower extremity Concern of bilateral lower extremity cellulitis with edema and erythema.  No leukocytosis, elevated CRP and ESR.  Preliminary blood cultures negative - Switching ceftriaxone  with cefadroxil to complete a 5-day course.  Acute renal failure superimposed on stage 3a chronic kidney disease (HCC) Most recent creatinine of 1.68 on 05/22/2024. It was 2.50 on admission, small improvement to 2.25 today.  Likely cardiorenal with concern of CHF exacerbation - Monitor renal function while he is being diuresed -Avoid nephrotoxin  Parkinson disease (HCC) - Continue Sinemet   Leg weakness Patient has worsening bilateral leg weakness for more than 2 months, recently came home from rehab.  MRI brain with no acute intracranial abnormality.  Some progression of chronic small vessel disease. - PT is recommending SNF -TOC consult  Obesity (BMI 30-39.9) Estimated body mass index is 31.79 kg/m as calculated from the following:   Height as of this encounter: 5' 10 (1.778 m).   Weight as of this encounter: 100.5 kg.   Patient with class I obesity. - Encouraged weight loss with exercise and healthy  diet   Subjective: Patient was seen and examined today.  No new concern but he was very tearful that since the passing  of his wife in May 2025 he is going down the hill and now unable to take care of himself.  Physical Exam: Vitals:   06/13/24 0436 06/13/24 0501 06/13/24 0731 06/13/24 1125  BP: (!) 123/45  (!) 112/51 (!) 117/56  Pulse: (!) 53  (!) 48 (!) 55  Resp: 20  16 16   Temp: 97.7 F (36.5 C)  (!) 97.5 F (36.4 C) 97.7 F (36.5 C)  TempSrc:   Oral Oral  SpO2: 100%  100% 97%  Weight:  93.9 kg    Height:       General.  Frail elderly man, in no acute distress. Pulmonary.  Lungs clear bilaterally, normal respiratory effort. CV.  Regular rate and rhythm, no JVD, rub or murmur. Abdomen.  Soft, nontender, nondistended, BS positive. CNS.  Alert and oriented .  No focal neurologic deficit. Extremities.  Trace LE edema, improving bilateral erythema. Psychiatry.  Judgment and insight appears normal.   Data Reviewed: Prior data reviewed  Family Communication: Pastor from his church at bedside  Disposition: Status is: Inpatient Remains inpatient appropriate because: Severity of illness  Planned Discharge Destination: Skilled nursing facility  DVT prophylaxis.  Eliquis  Time spent: 50 minutes  This record has been created using Conservation officer, historic buildings. Errors have been sought and corrected,but may not always be located. Such creation errors do not reflect on the standard of care.   Author: Amaryllis Dare, MD 06/13/2024 2:21 PM  For on call review [email or web address].

## 2024-06-13 NOTE — Assessment & Plan Note (Addendum)
 Patient admitted with lower extremity edema and elevated BNP at 1037 consistent with CHF exacerbation.  Prior echo done in June 2025 with normal EF Creatinine continue to improve -Switching IV Lasix  with torsemide -holding today due to concern of AKI - Continue with spironolactone  -Daily weight and BMP -Strict intake and output

## 2024-06-13 NOTE — NC FL2 (Signed)
 [place]  MEDICAID FL2 LEVEL OF CARE FORM     IDENTIFICATION  Patient Name: Francisco [name] Birthdate: 1945/10/05 Sex: male Admission Date (Current Location): 06/11/2024  [facility] and [place] Number:  Chiropodist and Address:  [facility], [address] [place], [place] [address]      Provider Number: [id number]  Attending Physician Name and Address:  Caleen Qualia, MD  Relative Name and Phone Number:       Current Level of Care: Hospital Recommended Level of Care: Skilled Nursing Facility Prior Approval Number:    Date Approved/Denied:   PASRR Number:  [id number])  Discharge Plan: SNF    Current Diagnoses: Patient Active Problem List   Diagnosis Date Noted   Acute renal failure superimposed on stage 3a chronic kidney disease (HCC) 06/12/2024   Leg weakness 06/12/2024   Cellulitis of lower extremity 06/12/2024   Myocardial injury 06/12/2024   AKI (acute kidney injury) 05/22/2024   Frequent falls 05/17/2024   Acute on chronic diastolic CHF (congestive heart failure) (HCC) 02/27/2024   Acute diastolic CHF (congestive heart failure) (HCC) 02/27/2024   Lower extremity cellulitis 02/27/2024   History of stroke 05/31/2023   Obesity (BMI 30-39.9) 05/31/2023   HLD (hyperlipidemia) 05/31/2023   Chronic diastolic CHF (congestive heart failure) (HCC) 05/31/2023   Hyperkalemia 05/31/2023   SVT (supraventricular tachycardia) 12/30/2021   Hypothyroidism 12/30/2021   Parkinson disease (HCC) 12/30/2021   Elevated troponin 12/30/2021   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   Acquired thrombophilia 01/13/2021   History of decompression of median nerve 01/01/2021   Paroxysmal atrial fibrillation (HCC) 10/06/2020   Carotid stenosis, right 09/17/2020   S/P cervical spinal fusion    Leukocytosis    Essential hypertension    Anemia of chronic disease    Postoperative pain    Neuropathic pain    Cervical myelopathy (HCC)  08/07/2020   Preop cardiovascular exam 07/17/2020   SOB (shortness of breath) on exertion 07/17/2020   Leg weakness, bilateral 07/13/2020   Aortic atherosclerosis 07/09/2020   Body mass index (BMI) 34.0-34.9, adult 12/25/2019   Myalgia 10/30/2019   Lumbar post-laminectomy syndrome 10/24/2019   PAD (peripheral artery disease) 06/06/2019   CKD (chronic kidney disease) stage 3, GFR 30-59 ml/min (HCC) 04/29/2019   B12 deficiency 01/23/2019   Left arm numbness 02/28/2018   Neck pain 02/28/2018   Macrocytic anemia 02/02/2017   DDD (degenerative disc disease), cervical 02/02/2017   Hypothyroid 02/02/2017   MRSA (methicillin resistant staph aureus) culture positive 02/02/2017   Nocturnal hypoxia 02/02/2017   Senile purpura 10/03/2016   Essential hypertension, benign 09/16/2016   Bilateral carotid artery disease 09/16/2016   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/18/2016   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   History of prostate cancer 12/21/2015   Left carpal tunnel syndrome 11/11/2015   Kidney stone on left side 07/05/2015   Myasthenia gravis (HCC) 05/26/2015   Radiculitis 01/05/2015   Long-term use of high-risk medication 10/15/2014   Persistent cough 09/10/2014   Pure hypercholesterolemia 07/25/2014   Spinal stenosis, lumbar region, with neurogenic claudication 02/24/2014   Lumbosacral stenosis with neurogenic claudication 02/24/2014   COPD (chronic obstructive pulmonary disease) (HCC) 01/22/2014    Orientation RESPIRATION BLADDER Height & Weight     Self, Time, Situation, Place  Normal External catheter Weight: 207 lb 0.2 oz (93.9 kg) Height:  5' 10 (177.8 cm)  BEHAVIORAL SYMPTOMS/MOOD NEUROLOGICAL BOWEL NUTRITION STATUS      Continent Diet (Diet Heart Fluid consistency: Thin;  Fluid restriction: 1500 mL Fluid: Cardiac starting at 09/30 2359)  AMBULATORY STATUS COMMUNICATION OF NEEDS Skin   Extensive Assist Verbally Other (Comment) (Bilateral lower extremities erythematous,  tender to touch)                       Personal Care Assistance Level of Assistance  Bathing, Feeding, Dressing Bathing Assistance: Maximum assistance Feeding assistance: Limited assistance Dressing Assistance: Maximum assistance     Functional Limitations Info             SPECIAL CARE FACTORS FREQUENCY  PT (By licensed PT), OT (By licensed OT)     PT Frequency: 5x OT Frequency: 5x            Contractures Contractures Info: Not present    Additional Factors Info  Code Status Code Status Info: DNR Limited Allergies Info: Azithromycin; Codeine           Current Medications (06/13/2024):  This is the current hospital active medication list Current Facility-Administered Medications  Medication Dose Route Frequency Provider Last Rate Last Admin   acetaminophen  (TYLENOL ) tablet 650 mg  650 mg Oral Q6H PRN Niu, Xilin, MD       albuterol  (PROVENTIL ) (2.5 MG/3ML) 0.083% nebulizer solution 2.5 mg  2.5 mg Nebulization Q4H PRN Dail Rankin RAMAN, RPH       apixaban  (ELIQUIS ) tablet 5 mg  5 mg Oral BID Niu, Xilin, MD   5 mg at 06/13/24 0911   azaTHIOprine  (IMURAN ) tablet 150 mg  150 mg Oral Daily Niu, Xilin, MD   150 mg at 06/13/24 0915   carbidopa -levodopa  (SINEMET  IR) 25-100 MG per tablet immediate release 1 tablet  1 tablet Oral TID Niu, Xilin, MD   1 tablet at 06/13/24 0911   cefTRIAXone  (ROCEPHIN ) 2 g in sodium chloride  0.9 % 100 mL IVPB  2 g Intravenous Q24H Niu, Xilin, MD 200 mL/hr at 06/13/24 0057 2 g at 06/13/24 0057   cholecalciferol  (VITAMIN D3) 25 MCG (1000 UNIT) tablet 2,000 Units  2,000 Units Oral Daily Niu, Xilin, MD   2,000 Units at 06/13/24 9088   cyanocobalamin  (VITAMIN B12) tablet 1,000 mcg  1,000 mcg Oral Daily Niu, Xilin, MD   1,000 mcg at 06/13/24 9087   dextromethorphan -guaiFENesin  (MUCINEX  DM) 30-600 MG per 12 hr tablet 1 tablet  1 tablet Oral BID PRN Niu, Xilin, MD       diphenhydrAMINE  (BENADRYL ) injection 12.5 mg  12.5 mg Intravenous Q8H PRN Niu,  Xilin, MD       furosemide  (LASIX ) injection 40 mg  40 mg Intravenous Q12H Niu, Xilin, MD   40 mg at 06/13/24 9088   hydrALAZINE  (APRESOLINE ) injection 5 mg  5 mg Intravenous Q2H PRN Niu, Xilin, MD       HYDROcodone -acetaminophen  (NORCO) 10-325 MG per tablet 1-2 tablet  1-2 tablet Oral Q6H PRN Niu, Xilin, MD       levothyroxine  (SYNTHROID ) tablet 50 mcg  50 mcg Oral Q0600 Niu, Xilin, MD   50 mcg at 06/13/24 0535   meclizine  (ANTIVERT ) tablet 25 mg  25 mg Oral TID PRN Niu, Xilin, MD       predniSONE  (DELTASONE ) tablet 10 mg  10 mg Oral Q M,W,F Niu, Xilin, MD   10 mg at 06/12/24 9078   spironolactone  (ALDACTONE ) tablet 25 mg  25 mg Oral Daily Niu, Xilin, MD   25 mg at 06/13/24 0911     Discharge Medications: Please see discharge summary for a list of discharge  medications.  Relevant Imaging Results:  Relevant Lab Results:   Additional Information [id number]  Alfonso Rummer, LCSW

## 2024-06-13 NOTE — Care Management Important Message (Signed)
 Important Message  Patient Details  Name: Francisco [name] MRN: [id number] Date of Birth: 05/21/1946   Important Message Given:  Yes - Medicare IM     Francisco [name] 06/13/2024, 4:37 PM

## 2024-06-13 NOTE — Assessment & Plan Note (Signed)
 Concern of bilateral lower extremity cellulitis with edema and erythema.  No leukocytosis, elevated CRP and ESR.  Preliminary blood cultures negative - Switching ceftriaxone  with cefadroxil to complete a 5-day course.

## 2024-06-13 NOTE — Assessment & Plan Note (Signed)
 Patient has worsening bilateral leg weakness for more than 2 months, recently came home from rehab.  MRI brain with no acute intracranial abnormality.  Some progression of chronic small vessel disease. - PT is recommending SNF -TOC consult

## 2024-06-13 NOTE — Plan of Care (Signed)
  Problem: Education: Goal: Ability to demonstrate management of disease process will improve Outcome: Progressing Goal: Ability to verbalize understanding of medication therapies will improve Outcome: Progressing Goal: Individualized Educational Video(s) Outcome: Progressing   Problem: Activity: Goal: Capacity to carry out activities will improve Outcome: Progressing   Problem: Cardiac: Goal: Ability to achieve and maintain adequate cardiopulmonary perfusion will improve Outcome: Progressing   Problem: Clinical Measurements: Goal: Ability to avoid or minimize complications of infection will improve Outcome: Progressing   Problem: Skin Integrity: Goal: Skin integrity will improve Outcome: Progressing   Problem: Education: Goal: Knowledge of General Education information will improve Description: Including pain rating scale, medication(s)/side effects and non-pharmacologic comfort measures Outcome: Progressing   Problem: Health Behavior/Discharge Planning: Goal: Ability to manage health-related needs will improve Outcome: Progressing   Problem: Clinical Measurements: Goal: Ability to maintain clinical measurements within normal limits will improve Outcome: Progressing Goal: Will remain free from infection Outcome: Progressing Goal: Diagnostic test results will improve Outcome: Progressing Goal: Respiratory complications will improve Outcome: Progressing Goal: Cardiovascular complication will be avoided Outcome: Progressing   Problem: Activity: Goal: Risk for activity intolerance will decrease Outcome: Progressing   Problem: Nutrition: Goal: Adequate nutrition will be maintained Outcome: Progressing   Problem: Coping: Goal: Level of anxiety will decrease Outcome: Progressing   Problem: Elimination: Goal: Will not experience complications related to bowel motility Outcome: Progressing Goal: Will not experience complications related to urinary  retention Outcome: Progressing   Problem: Pain Managment: Goal: General experience of comfort will improve and/or be controlled Outcome: Progressing   Problem: Safety: Goal: Ability to remain free from injury will improve Outcome: Progressing   Problem: Skin Integrity: Goal: Risk for impaired skin integrity will decrease Outcome: Progressing

## 2024-06-14 DIAGNOSIS — I5033 Acute on chronic diastolic (congestive) heart failure: Secondary | ICD-10-CM | POA: Diagnosis not present

## 2024-06-14 DIAGNOSIS — I480 Paroxysmal atrial fibrillation: Secondary | ICD-10-CM | POA: Diagnosis not present

## 2024-06-14 DIAGNOSIS — R29898 Other symptoms and signs involving the musculoskeletal system: Secondary | ICD-10-CM

## 2024-06-14 DIAGNOSIS — I5A Non-ischemic myocardial injury (non-traumatic): Secondary | ICD-10-CM | POA: Diagnosis not present

## 2024-06-14 LAB — ECHOCARDIOGRAM COMPLETE
AR max vel: 2.22 cm2
AV Area VTI: 2.22 cm2
AV Area mean vel: 2.03 cm2
AV Mean grad: 4 mmHg
AV Peak grad: 7.8 mmHg
Ao pk vel: 1.4 m/s
Area-P 1/2: 2.8 cm2
Calc EF: 58.1 %
Height: 70 in
MV VTI: 1.91 cm2
S' Lateral: 2.2 cm
Single Plane A2C EF: 49.1 %
Single Plane A4C EF: 61.1 %
Weight: 3312.19 [oz_av]

## 2024-06-14 LAB — BASIC METABOLIC PANEL WITH GFR
Anion gap: 11 (ref 5–15)
BUN: 42 mg/dL — ABNORMAL HIGH (ref 8–23)
CO2: 31 mmol/L (ref 22–32)
Calcium: 8.6 mg/dL — ABNORMAL LOW (ref 8.9–10.3)
Chloride: 100 mmol/L (ref 98–111)
Creatinine, Ser: 1.62 mg/dL — ABNORMAL HIGH (ref 0.61–1.24)
GFR, Estimated: 43 mL/min — ABNORMAL LOW (ref >60)
Glucose, Bld: 90 mg/dL (ref 70–99)
Potassium: 3.6 mmol/L (ref 3.5–5.1)
Sodium: 142 mmol/L (ref 135–145)

## 2024-06-14 NOTE — Progress Notes (Signed)
 Progress Note   Patient: Francisco [name] FMW:[id number] DOB: 1946-07-15 DOA: 06/11/2024     3 DOS: the patient was seen and examined on 06/14/2024   Brief hospital course: Partly taken from H&P.  Francisco [name] is a 78 y.o. male with medical history significant of A-fib on Eliquis , Parkinson's disease, HTN,  HLD, COPD, PVD, dCHD, stroke, hypothyroidism, bilateral carotid artery stenosis (s/p of stent of right carotid artery), myasthenia gravis, Merkel cell carcinoma, who presents with bilateral leg weakness, leg pain and swelling.   He was also having left leg numbness for about 2 months.  On presentation stable vitals with heart rate ranging between 48-60, labs with BNP of 1037, INR 1.6, CXR with elevation of right hemidiaphragm. CT head was negative for any acute intracranial abnormality.  Lower extremity venous Doppler negative for DVT but did show Baker's cyst  Patient was admitted for acute on chronic HFpEF.  Started on IV Lasix .  Also started on ceftriaxone  for concern of lower extremity cellulitis  10/1: Vital stable, troponin 35>33>25, creatinine with some improvement to 2.25, preliminary blood cultures negative.  Mildly elevated CRP at 2.2 and elevated ESR at 70.  PT is recommending SNF.  10/2: Hemodynamically stable, creatinine improving now at 2.01.  Improving lower extremity cellulitis so antibiotics switched with cefadroxil. Continuing IV Lasix  and TOC is looking for placement.  10/3: Remained hemodynamically stable, renal function continued to improve, will continue with IV Lasix  while TOC is working on placement.  Assessment and Plan: * Acute on chronic diastolic CHF (congestive heart failure) (HCC) Patient admitted with lower extremity edema and elevated BNP at 1037 consistent with CHF exacerbation.  Prior echo done in June 2025 with normal EF Creatinine continue to improve -Continue with IV Lasix  - Continue with spironolactone  -Daily weight and BMP -Strict  intake and output  Myocardial injury Mildly elevated troponin at 35 with a downward trend on subsequent check.  No chest pain.  Likely demand ischemia with CHF exacerbation.  Paroxysmal atrial fibrillation [facility]) Patient has borderline bradycardia. - Keep holding home amiodarone  and metoprolol  -Continue with Eliquis   History of stroke - On Eliquis  due to A-fib  Essential hypertension Blood pressure within lower goal. -Continue IV Lasix  and spironolactone   Myasthenia gravis (HCC) -Continue home prednisone  10 mg on Monday Wednesday Friday -  received Solu-Cortef 100 mg as a stress dose - Continue home azathioprine  50 mg daily  COPD (chronic obstructive pulmonary disease) (HCC) No acute concern. - Continue with as needed bronchodilators  Hypothyroidism - Continue with home Synthroid   Cellulitis of lower extremity Concern of bilateral lower extremity cellulitis with edema and erythema.  No leukocytosis, elevated CRP and ESR.  Preliminary blood cultures negative - Switching ceftriaxone  with cefadroxil to complete a 5-day course.  Acute renal failure superimposed on stage 3a chronic kidney disease (HCC) Most recent creatinine of 1.68 on 05/22/2024. It was 2.50 on admission, Likely cardiorenal with concern of CHF exacerbation.  Creatinine continue to improve with diuresis, now at 1.62 - Monitor renal function while he is being diuresed -Avoid nephrotoxin  Parkinson disease (HCC) - Continue Sinemet   Leg weakness Patient has worsening bilateral leg weakness for more than 2 months, recently came home from rehab.  MRI brain with no acute intracranial abnormality.  Some progression of chronic small vessel disease. - PT is recommending SNF -TOC consult  Obesity (BMI 30-39.9) Estimated body mass index is 31.79 kg/m as calculated from the following:   Height as of this encounter: 5' 10 (1.778 m).  Weight as of this encounter: 100.5 kg.   Patient with class I obesity. -  Encouraged weight loss with exercise and healthy diet   Subjective: Patient continued to feel weak, no chest pain or shortness of breath.  He wants to talk with Child psychotherapist.  Physical Exam: Vitals:   06/14/24 0428 06/14/24 0500 06/14/24 0748 06/14/24 1232  BP: (!) 134/57  (!) 124/53 (!) 119/51  Pulse: (!) 48  (!) 44 (!) 44  Resp: 20  16 18   Temp: 98 F (36.7 C)  98.3 F (36.8 C) (!) 97.3 F (36.3 C)  TempSrc:      SpO2: 95%  96% 93%  Weight:  98.3 kg    Height:       General. Frail elderly man,In no acute distress. Pulmonary.  Lungs clear bilaterally, normal respiratory effort. CV.  Regular rate and rhythm, no JVD, rub or murmur. Abdomen.  Soft, nontender, nondistended, BS positive. CNS.  Alert and oriented .  No focal neurologic deficit. Extremities.  No edema,  pulses intact and symmetrical. Psychiatry.  Judgment and insight appears normal.   Data Reviewed: Prior data reviewed  Family Communication: Talked with daughter on phone.  Disposition: Status is: Inpatient Remains inpatient appropriate because: Severity of illness  Planned Discharge Destination: Skilled nursing facility  DVT prophylaxis.  Eliquis  Time spent: 45 minutes  This record has been created using Conservation officer, historic buildings. Errors have been sought and corrected,but may not always be located. Such creation errors do not reflect on the standard of care.   Author: Amaryllis Dare, MD 06/14/2024 12:56 PM  For on call review [email or web address].

## 2024-06-14 NOTE — Assessment & Plan Note (Addendum)
 Most recent creatinine of 1.68 on 05/22/2024. It was 2.50 on admission, Likely cardiorenal with concern of CHF exacerbation.  Creatinine continue to improve with diuresis, now at 1.51 - Monitor renal function while he is being diuresed -Avoid nephrotoxin

## 2024-06-14 NOTE — Progress Notes (Signed)
 Physical Therapy Treatment Patient Details Name: Francisco [name] MRN: [id number] DOB: 30-Aug-1946 Today's Date: 06/14/2024   History of Present Illness Pt is a 78 year old male presents with leg weakness, leg pain and swelling, admitted with Acute on chronic diastolic CHF, Possible cellulitis of lower extremity, Acute renal failure superimposed on stage 3a chronic kidney disease. PMH significant for A-fib on Eliquis , Parkinson's disease, HTN,  HLD, COPD, PVD, dCHD, stroke, hypothyroidism, bilateral carotid artery stenosis (s/p of stent of right carotid artery), myasthenia gravis, Merkel cell carcinoma    PT Comments  Patient is agreeable to PT session. He continues to have left leg weakness. Max A required for bed mobility. Patient unable to stand with one person assistance despite maximal effort provided and using Stedy. He is not at his baseline level of functional independence. Recommend to continue PT to maximize independence and decrease caregiver burden. Rehabilitation < 3 hours/day recommended after this hospital stay.    If plan is discharge home, recommend the following: Two people to help with walking and/or transfers;A lot of help with bathing/dressing/bathroom;Help with stairs or ramp for entrance;Assistance with cooking/housework   Can travel by private vehicle     No  Equipment Recommendations  None recommended by PT    Recommendations for Other Services       Precautions / Restrictions Precautions Precautions: Fall Recall of Precautions/Restrictions: Intact Restrictions Weight Bearing Restrictions Per Provider Order: No     Mobility  Bed Mobility Overal bed mobility: Needs Assistance Bed Mobility: Supine to Sit, Sit to Supine     Supine to sit: Max assist Sit to supine: Mod assist   General bed mobility comments: assistance for LLE and trunk support. cues for sequencing    Transfers Overall transfer level: Needs assistance                 General  transfer comment: attempted to stand x 2 bouts from elevated bed height using the Stedy. patient was unable to stand despite maximal effort provided. cues for technique and hand placement to faciliate standing.    Ambulation/Gait               General Gait Details: unable to stand with one person assistance today, unable to walk   Stairs             Wheelchair Mobility     Tilt Bed    Modified Rankin (Stroke Patients Only)       Balance Overall balance assessment: Needs assistance Sitting-balance support: Feet supported Sitting balance-Leahy Scale: Good                                      Communication Communication Communication: No apparent difficulties  Cognition Arousal: Alert Behavior During Therapy: WFL for tasks assessed/performed   PT - Cognitive impairments: No apparent impairments                         Following commands: Intact      Cueing Cueing Techniques: Verbal cues, Visual cues  Exercises General Exercises - Lower Extremity Ankle Circles/Pumps: AROM, Strengthening, Left, 10 reps, Seated Long Arc Quad: AAROM, Strengthening, Left, 10 reps, Seated Other Exercises Other Exercises: gentle hamstring stretch provided to LLE while sitting for ~ 30 seconds. cues for exercise technique with cues for positioning to promote active movement    General Comments  Pertinent Vitals/Pain Pain Assessment Pain Assessment: Faces Faces Pain Scale: Hurts a little bit Pain Location: left leg Pain Descriptors / Indicators: Discomfort Pain Intervention(s): Limited activity within patient's tolerance, Monitored during session, Repositioned    Home Living                          Prior Function            PT Goals (current goals can now be found in the care plan section) Acute Rehab PT Goals Patient Stated Goal: to get stronger PT Goal Formulation: With patient Time For Goal Achievement:  06/26/24 Potential to Achieve Goals: Fair Progress towards PT goals: Progressing toward goals    Frequency    Min 2X/week      PT Plan      Co-evaluation              AM-PAC PT 6 Clicks Mobility   Outcome Measure  Help needed turning from your back to your side while in a flat bed without using bedrails?: A Lot Help needed moving from lying on your back to sitting on the side of a flat bed without using bedrails?: A Lot Help needed moving to and from a bed to a chair (including a wheelchair)?: Total Help needed standing up from a chair using your arms (e.g., wheelchair or bedside chair)?: Total Help needed to walk in hospital room?: Total Help needed climbing 3-5 steps with a railing? : Total 6 Click Score: 8    End of Session   Activity Tolerance: Patient limited by fatigue Patient left: in bed;with call bell/phone within reach;with bed alarm set Nurse Communication: Mobility status PT Visit Diagnosis: Muscle weakness (generalized) (M62.81);Unsteadiness on feet (R26.81)     Time: 9081-9056 PT Time Calculation (min) (ACUTE ONLY): 25 min  Charges:    $Therapeutic Activity: 23-37 mins PT General Charges $$ ACUTE PT VISIT: 1 Visit                    Randine Essex, PT, MPT    Randine LULLA Essex 06/14/2024, 9:51 AM

## 2024-06-14 NOTE — Plan of Care (Signed)
  Problem: Education: Goal: Ability to demonstrate management of disease process will improve Outcome: Progressing Goal: Ability to verbalize understanding of medication therapies will improve Outcome: Progressing Goal: Individualized Educational Video(s) Outcome: Progressing   Problem: Activity: Goal: Capacity to carry out activities will improve Outcome: Progressing   Problem: Cardiac: Goal: Ability to achieve and maintain adequate cardiopulmonary perfusion will improve Outcome: Progressing   Problem: Clinical Measurements: Goal: Ability to avoid or minimize complications of infection will improve Outcome: Progressing   Problem: Skin Integrity: Goal: Skin integrity will improve Outcome: Progressing   Problem: Education: Goal: Knowledge of General Education information will improve Description: Including pain rating scale, medication(s)/side effects and non-pharmacologic comfort measures Outcome: Progressing   Problem: Health Behavior/Discharge Planning: Goal: Ability to manage health-related needs will improve Outcome: Progressing   Problem: Clinical Measurements: Goal: Ability to maintain clinical measurements within normal limits will improve Outcome: Progressing Goal: Will remain free from infection Outcome: Progressing Goal: Diagnostic test results will improve Outcome: Progressing Goal: Respiratory complications will improve Outcome: Progressing Goal: Cardiovascular complication will be avoided Outcome: Progressing   Problem: Activity: Goal: Risk for activity intolerance will decrease Outcome: Progressing   Problem: Nutrition: Goal: Adequate nutrition will be maintained Outcome: Progressing   Problem: Coping: Goal: Level of anxiety will decrease Outcome: Progressing   Problem: Elimination: Goal: Will not experience complications related to bowel motility Outcome: Progressing Goal: Will not experience complications related to urinary  retention Outcome: Progressing   Problem: Pain Managment: Goal: General experience of comfort will improve and/or be controlled Outcome: Progressing   Problem: Safety: Goal: Ability to remain free from injury will improve Outcome: Progressing   Problem: Skin Integrity: Goal: Risk for impaired skin integrity will decrease Outcome: Progressing

## 2024-06-15 DIAGNOSIS — I5033 Acute on chronic diastolic (congestive) heart failure: Secondary | ICD-10-CM | POA: Diagnosis not present

## 2024-06-15 DIAGNOSIS — I5A Non-ischemic myocardial injury (non-traumatic): Secondary | ICD-10-CM | POA: Diagnosis not present

## 2024-06-15 DIAGNOSIS — I480 Paroxysmal atrial fibrillation: Secondary | ICD-10-CM | POA: Diagnosis not present

## 2024-06-15 DIAGNOSIS — R29898 Other symptoms and signs involving the musculoskeletal system: Secondary | ICD-10-CM | POA: Diagnosis not present

## 2024-06-15 LAB — BASIC METABOLIC PANEL WITH GFR
Anion gap: 14 (ref 5–15)
BUN: 40 mg/dL — ABNORMAL HIGH (ref 8–23)
CO2: 29 mmol/L (ref 22–32)
Calcium: 8.7 mg/dL — ABNORMAL LOW (ref 8.9–10.3)
Chloride: 98 mmol/L (ref 98–111)
Creatinine, Ser: 1.51 mg/dL — ABNORMAL HIGH (ref 0.61–1.24)
GFR, Estimated: 47 mL/min — ABNORMAL LOW (ref >60)
Glucose, Bld: 91 mg/dL (ref 70–99)
Potassium: 3.6 mmol/L (ref 3.5–5.1)
Sodium: 141 mmol/L (ref 135–145)

## 2024-06-15 MED ORDER — POLYVINYL ALCOHOL 1.4 % OP SOLN
1.0000 [drp] | OPHTHALMIC | Status: DC | PRN
  Administered 2024-06-15 – 2024-06-18 (×5): 1 [drp] via OPHTHALMIC
  Filled 2024-06-15: qty 15

## 2024-06-15 NOTE — TOC CM/SW Note (Signed)
 Transition of Care (TOC) CM/SW Note    [facility] - [facility]   Patient Details  Name: Francisco [name] MRN: [id number] Date of Birth: Dec 24, 1945  [facility]) CM/SW Contact:    Edsel DELENA Fischer, LCSW Phone Number: 06/15/2024, 12:36 PM   Clinical Narrative:  SNF pending auth id # [id number]   Transition of Care Asessment:

## 2024-06-15 NOTE — Progress Notes (Signed)
 Progress Note   Patient: Francisco [name] FMW:[id number] DOB: [birth year] DOA: 06/11/2024     4 DOS: the patient was seen and examined on 06/15/2024   Brief hospital course: Partly taken from H&P.  Francisco [name] is a 78 y.o. male with medical history significant of A-fib on Eliquis , Parkinson's disease, HTN,  HLD, COPD, PVD, dCHD, stroke, hypothyroidism, bilateral carotid artery stenosis (s/p of stent of right carotid artery), myasthenia gravis, Merkel cell carcinoma, who presents with bilateral leg weakness, leg pain and swelling.   He was also having left leg numbness for about 2 months.  On presentation stable vitals with heart rate ranging between 48-60, labs with BNP of 1037, INR 1.6, CXR with elevation of right hemidiaphragm. CT head was negative for any acute intracranial abnormality.  Lower extremity venous Doppler negative for DVT but did show Baker's cyst  Patient was admitted for acute on chronic HFpEF.  Started on IV Lasix .  Also started on ceftriaxone  for concern of lower extremity cellulitis  10/1: Vital stable, troponin 35>33>25, creatinine with some improvement to 2.25, preliminary blood cultures negative.  Mildly elevated CRP at 2.2 and elevated ESR at 70.  PT is recommending SNF.  10/2: Hemodynamically stable, creatinine improving now at 2.01.  Improving lower extremity cellulitis so antibiotics switched with cefadroxil. Continuing IV Lasix  and TOC is looking for placement.  10/3: Remained hemodynamically stable, renal function continued to improve, will continue with IV Lasix  while TOC is working on placement.  10/4: Hemodynamically stable, renal function continue to improve so continuing IV diuresis.  Medically stable to go to rehab.  Assessment and Plan: * Acute on chronic diastolic CHF (congestive heart failure) (HCC) Patient admitted with lower extremity edema and elevated BNP at 1037 consistent with CHF exacerbation.  Prior echo done in June 2025 with normal  EF Creatinine continue to improve -Continue with IV Lasix  - Continue with spironolactone  -Daily weight and BMP -Strict intake and output  Myocardial injury Mildly elevated troponin at 35 with a downward trend on subsequent check.  No chest pain.  Likely demand ischemia with CHF exacerbation.  Paroxysmal atrial fibrillation [facility]) Patient has borderline bradycardia. - Keep holding home amiodarone  and metoprolol  -Continue with Eliquis   History of stroke - On Eliquis  due to A-fib  Essential hypertension Blood pressure within lower goal. -Continue IV Lasix  and spironolactone   Myasthenia gravis (HCC) -Continue home prednisone  10 mg on Monday Wednesday Friday -  received Solu-Cortef 100 mg as a stress dose - Continue home azathioprine  50 mg daily  COPD (chronic obstructive pulmonary disease) (HCC) No acute concern. - Continue with as needed bronchodilators  Hypothyroidism - Continue with home Synthroid   Cellulitis of lower extremity Concern of bilateral lower extremity cellulitis with edema and erythema.  No leukocytosis, elevated CRP and ESR.  Preliminary blood cultures negative - Switching ceftriaxone  with cefadroxil to complete a 5-day course.  Acute renal failure superimposed on stage 3a chronic kidney disease (HCC) Most recent creatinine of 1.68 on 05/22/2024. It was 2.50 on admission, Likely cardiorenal with concern of CHF exacerbation.  Creatinine continue to improve with diuresis, now at 1.51 - Monitor renal function while he is being diuresed -Avoid nephrotoxin  Parkinson disease (HCC) - Continue Sinemet   Leg weakness Patient has worsening bilateral leg weakness for more than 2 months, recently came home from rehab.  MRI brain with no acute intracranial abnormality.  Some progression of chronic small vessel disease. - PT is recommending SNF -TOC consult  Obesity (BMI 30-39.9) Estimated body mass  index is 31.79 kg/m as calculated from the following:   Height as  of this encounter: 5' 10 (1.778 m).   Weight as of this encounter: 100.5 kg.   Patient with class I obesity. - Encouraged weight loss with exercise and healthy diet   Subjective: Patient was seen and examined today.  No new concern.  Physical Exam: Vitals:   06/15/24 0426 06/15/24 0500 06/15/24 0849 06/15/24 1238  BP: (!) 121/55  (!) 143/57 113/62  Pulse: (!) 56  (!) 43 (!) 51  Resp: 20     Temp: 98.3 F (36.8 C)  97.9 F (36.6 C) 97.8 F (36.6 C)  TempSrc: Oral     SpO2: 94%  94% 97%  Weight:  97.2 kg    Height:       General.  Frail elderly man, in no acute distress. Pulmonary.  Lungs clear bilaterally, normal respiratory effort. CV.  Regular rate and rhythm, no JVD, rub or murmur. Abdomen.  Soft, nontender, nondistended, BS positive. CNS.  Alert and oriented .  No focal neurologic deficit. Extremities.  No edema, no cyanosis, pulses intact and symmetrical. Psychiatry.  Judgment and insight appears normal.    Data Reviewed: Prior data reviewed  Family Communication: Talked with stepdaughter on phone while in the room.  Disposition: Status is: Inpatient Remains inpatient appropriate because: Severity of illness  Planned Discharge Destination: Skilled nursing facility  DVT prophylaxis.  Eliquis  Time spent: 44 minutes  This record has been created using Conservation officer, historic buildings. Errors have been sought and corrected,but may not always be located. Such creation errors do not reflect on the standard of care.   Author: Amaryllis Dare, MD 06/15/2024 2:34 PM  For on call review [email or web address].

## 2024-06-15 NOTE — Plan of Care (Signed)
  Problem: Education: Goal: Ability to demonstrate management of disease process will improve Outcome: Progressing   Problem: Cardiac: Goal: Ability to achieve and maintain adequate cardiopulmonary perfusion will improve Outcome: Progressing   Problem: Skin Integrity: Goal: Skin integrity will improve Outcome: Progressing   Problem: Clinical Measurements: Goal: Will remain free from infection Outcome: Progressing Goal: Cardiovascular complication will be avoided Outcome: Progressing   Problem: Activity: Goal: Risk for activity intolerance will decrease Outcome: Progressing   Problem: Nutrition: Goal: Adequate nutrition will be maintained Outcome: Progressing   Problem: Elimination: Goal: Will not experience complications related to urinary retention Outcome: Progressing   Problem: Pain Managment: Goal: General experience of comfort will improve and/or be controlled Outcome: Progressing   Problem: Safety: Goal: Ability to remain free from injury will improve Outcome: Progressing   Problem: Skin Integrity: Goal: Risk for impaired skin integrity will decrease Outcome: Progressing

## 2024-06-16 DIAGNOSIS — R29898 Other symptoms and signs involving the musculoskeletal system: Secondary | ICD-10-CM | POA: Diagnosis not present

## 2024-06-16 DIAGNOSIS — I5A Non-ischemic myocardial injury (non-traumatic): Secondary | ICD-10-CM | POA: Diagnosis not present

## 2024-06-16 DIAGNOSIS — I5033 Acute on chronic diastolic (congestive) heart failure: Secondary | ICD-10-CM | POA: Diagnosis not present

## 2024-06-16 DIAGNOSIS — I480 Paroxysmal atrial fibrillation: Secondary | ICD-10-CM | POA: Diagnosis not present

## 2024-06-16 LAB — BASIC METABOLIC PANEL WITH GFR
Anion gap: 12 (ref 5–15)
BUN: 37 mg/dL — ABNORMAL HIGH (ref 8–23)
CO2: 30 mmol/L (ref 22–32)
Calcium: 8.9 mg/dL (ref 8.9–10.3)
Chloride: 100 mmol/L (ref 98–111)
Creatinine, Ser: 1.41 mg/dL — ABNORMAL HIGH (ref 0.61–1.24)
GFR, Estimated: 51 mL/min — ABNORMAL LOW (ref >60)
Glucose, Bld: 88 mg/dL (ref 70–99)
Potassium: 3.4 mmol/L — ABNORMAL LOW (ref 3.5–5.1)
Sodium: 142 mmol/L (ref 135–145)

## 2024-06-16 MED ORDER — POTASSIUM CHLORIDE CRYS ER 20 MEQ PO TBCR
40.0000 meq | EXTENDED_RELEASE_TABLET | Freq: Once | ORAL | Status: AC
  Administered 2024-06-16: 40 meq via ORAL
  Filled 2024-06-16: qty 2

## 2024-06-16 MED ORDER — CIPROFLOXACIN HCL 0.3 % OP SOLN
1.0000 [drp] | OPHTHALMIC | Status: AC
Start: 2024-06-16 — End: 2024-06-19
  Administered 2024-06-16 – 2024-06-19 (×15): 1 [drp] via OPHTHALMIC
  Filled 2024-06-16: qty 2.5

## 2024-06-16 NOTE — Plan of Care (Signed)

## 2024-06-16 NOTE — Progress Notes (Signed)
 Progress Note   Patient: Francisco [name] FMW:[id number] DOB: 08/02/1946 DOA: 06/11/2024     5 DOS: the patient was seen and examined on 06/16/2024   Brief hospital course: Partly taken from H&P.  Francisco [name] is a 78 y.o. male with medical history significant of A-fib on Eliquis , Parkinson's disease, HTN,  HLD, COPD, PVD, dCHD, stroke, hypothyroidism, bilateral carotid artery stenosis (s/p of stent of right carotid artery), myasthenia gravis, Merkel cell carcinoma, who presents with bilateral leg weakness, leg pain and swelling.   He was also having left leg numbness for about 2 months.  On presentation stable vitals with heart rate ranging between 48-60, labs with BNP of 1037, INR 1.6, CXR with elevation of right hemidiaphragm. CT head was negative for any acute intracranial abnormality.  Lower extremity venous Doppler negative for DVT but did show Baker's cyst  Patient was admitted for acute on chronic HFpEF.  Started on IV Lasix .  Also started on ceftriaxone  for concern of lower extremity cellulitis  10/1: Vital stable, troponin 35>33>25, creatinine with some improvement to 2.25, preliminary blood cultures negative.  Mildly elevated CRP at 2.2 and elevated ESR at 70.  PT is recommending SNF.  10/2: Hemodynamically stable, creatinine improving now at 2.01.  Improving lower extremity cellulitis so antibiotics switched with cefadroxil. Continuing IV Lasix  and TOC is looking for placement.  10/3: Remained hemodynamically stable, renal function continued to improve, will continue with IV Lasix  while TOC is working on placement.  10/4: Hemodynamically stable, renal function continue to improve so continuing IV diuresis.  Medically stable to go to rehab.  10/5: Remained hemodynamically stable and kidney functions continue to improve now creatinine at 1.41.  Awaiting SNF placement  Assessment and Plan: * Acute on chronic diastolic CHF (congestive heart failure) (HCC) Patient  admitted with lower extremity edema and elevated BNP at 1037 consistent with CHF exacerbation.  Prior echo done in June 2025 with normal EF Creatinine continue to improve -Continue with IV Lasix  - Continue with spironolactone  -Daily weight and BMP -Strict intake and output  Myocardial injury Mildly elevated troponin at 35 with a downward trend on subsequent check.  No chest pain.  Likely demand ischemia with CHF exacerbation.  Paroxysmal atrial fibrillation [name]) Patient has borderline bradycardia. - Keep holding home amiodarone  and metoprolol  -Continue with Eliquis   History of stroke - On Eliquis  due to A-fib  Essential hypertension Blood pressure within lower goal. -Continue IV Lasix  and spironolactone   Myasthenia gravis (HCC) -Continue home prednisone  10 mg on Monday Wednesday Friday -  received Solu-Cortef 100 mg as a stress dose - Continue home azathioprine  50 mg daily  COPD (chronic obstructive pulmonary disease) (HCC) No acute concern. - Continue with as needed bronchodilators  Hypothyroidism - Continue with home Synthroid   Cellulitis of lower extremity Concern of bilateral lower extremity cellulitis with edema and erythema.  No leukocytosis, elevated CRP and ESR.  Preliminary blood cultures negative - Switching ceftriaxone  with cefadroxil to complete a 5-day course.  Acute renal failure superimposed on stage 3a chronic kidney disease (HCC) Most recent creatinine of 1.68 on 05/22/2024. It was 2.50 on admission, Likely cardiorenal with concern of CHF exacerbation.  Creatinine continue to improve with diuresis, now at 1.41 - Monitor renal function while he is being diuresed -Avoid nephrotoxin  Parkinson disease (HCC) - Continue Sinemet   Leg weakness Patient has worsening bilateral leg weakness for more than 2 months, recently came home from rehab.  MRI brain with no acute intracranial abnormality.  Some progression  of chronic small vessel disease. - PT is  recommending SNF -TOC consult  Obesity (BMI 30-39.9) Estimated body mass index is 31.79 kg/m as calculated from the following:   Height as of this encounter: 5' 10 (1.778 m).   Weight as of this encounter: 100.5 kg.   Patient with class I obesity. - Encouraged weight loss with exercise and healthy diet  Subjective: Patient was seen and examined today.  No new concern.  Physical Exam: Vitals:   06/16/24 0800 06/16/24 0813 06/16/24 1150 06/16/24 1526  BP:  (!) 145/61 118/68 109/68  Pulse:  (!) 47 (!) 51 (!) 50  Resp:  18 18   Temp:  98.2 F (36.8 C) 98.1 F (36.7 C) 97.8 F (36.6 C)  TempSrc:      SpO2:  91% 98% 97%  Weight: 96.4 kg     Height:       General.  Frail elderly man, in no acute distress. Pulmonary.  Lungs clear bilaterally, normal respiratory effort. CV.  Regular rate and rhythm, no JVD, rub or murmur. Abdomen.  Soft, nontender, nondistended, BS positive. CNS.  Alert and oriented .  No focal neurologic deficit. Extremities.  No edema, no cyanosis, pulses intact and symmetrical. Psychiatry.  Judgment and insight appears normal.     Data Reviewed: Prior data reviewed  Family Communication:   Disposition: Status is: Inpatient Remains inpatient appropriate because: Severity of illness  Planned Discharge Destination: Skilled nursing facility  DVT prophylaxis.  Eliquis  Time spent: 43 minutes  This record has been created using Conservation officer, historic buildings. Errors have been sought and corrected,but may not always be located. Such creation errors do not reflect on the standard of care.   Author: Amaryllis Dare, MD 06/16/2024 3:48 PM  For on call review [email or web address].

## 2024-06-16 NOTE — Plan of Care (Signed)
  Problem: Education: Goal: Ability to demonstrate management of disease process will improve Outcome: Progressing Goal: Ability to verbalize understanding of medication therapies will improve Outcome: Progressing Goal: Individualized Educational Video(s) Outcome: Progressing   Problem: Activity: Goal: Capacity to carry out activities will improve Outcome: Progressing   Problem: Cardiac: Goal: Ability to achieve and maintain adequate cardiopulmonary perfusion will improve Outcome: Progressing   Problem: Clinical Measurements: Goal: Ability to avoid or minimize complications of infection will improve Outcome: Progressing   Problem: Skin Integrity: Goal: Skin integrity will improve Outcome: Progressing   Problem: Education: Goal: Knowledge of General Education information will improve Description: Including pain rating scale, medication(s)/side effects and non-pharmacologic comfort measures Outcome: Progressing   Problem: Health Behavior/Discharge Planning: Goal: Ability to manage health-related needs will improve Outcome: Progressing   Problem: Clinical Measurements: Goal: Ability to maintain clinical measurements within normal limits will improve Outcome: Progressing Goal: Will remain free from infection Outcome: Progressing Goal: Diagnostic test results will improve Outcome: Progressing Goal: Respiratory complications will improve Outcome: Progressing Goal: Cardiovascular complication will be avoided Outcome: Progressing   Problem: Activity: Goal: Risk for activity intolerance will decrease Outcome: Progressing   Problem: Nutrition: Goal: Adequate nutrition will be maintained Outcome: Progressing   Problem: Coping: Goal: Level of anxiety will decrease Outcome: Progressing   Problem: Elimination: Goal: Will not experience complications related to bowel motility Outcome: Progressing Goal: Will not experience complications related to urinary  retention Outcome: Progressing   Problem: Pain Managment: Goal: General experience of comfort will improve and/or be controlled Outcome: Progressing   Problem: Safety: Goal: Ability to remain free from injury will improve Outcome: Progressing   Problem: Skin Integrity: Goal: Risk for impaired skin integrity will decrease Outcome: Progressing

## 2024-06-17 DIAGNOSIS — I480 Paroxysmal atrial fibrillation: Secondary | ICD-10-CM | POA: Diagnosis not present

## 2024-06-17 DIAGNOSIS — I5A Non-ischemic myocardial injury (non-traumatic): Secondary | ICD-10-CM | POA: Diagnosis not present

## 2024-06-17 DIAGNOSIS — R29898 Other symptoms and signs involving the musculoskeletal system: Secondary | ICD-10-CM | POA: Diagnosis not present

## 2024-06-17 DIAGNOSIS — I5033 Acute on chronic diastolic (congestive) heart failure: Secondary | ICD-10-CM | POA: Diagnosis not present

## 2024-06-17 LAB — BASIC METABOLIC PANEL WITH GFR
Anion gap: 12 (ref 5–15)
BUN: 35 mg/dL — ABNORMAL HIGH (ref 8–23)
CO2: 31 mmol/L (ref 22–32)
Calcium: 9.2 mg/dL (ref 8.9–10.3)
Chloride: 98 mmol/L (ref 98–111)
Creatinine, Ser: 1.53 mg/dL — ABNORMAL HIGH (ref 0.61–1.24)
GFR, Estimated: 46 mL/min — ABNORMAL LOW (ref >60)
Glucose, Bld: 105 mg/dL — ABNORMAL HIGH (ref 70–99)
Potassium: 4.1 mmol/L (ref 3.5–5.1)
Sodium: 141 mmol/L (ref 135–145)

## 2024-06-17 LAB — CULTURE, BLOOD (ROUTINE X 2)
Culture: NO GROWTH
Culture: NO GROWTH
Special Requests: ADEQUATE
Special Requests: ADEQUATE

## 2024-06-17 MED ORDER — TORSEMIDE 20 MG PO TABS
40.0000 mg | ORAL_TABLET | Freq: Every day | ORAL | Status: DC
  Administered 2024-06-18: 40 mg via ORAL
  Filled 2024-06-17: qty 2

## 2024-06-17 NOTE — Progress Notes (Signed)
 Progress Note   Patient: Francisco [name] FMW:[id number] DOB: 1945-11-09 DOA: 06/11/2024     6 DOS: the patient was seen and examined on 06/17/2024   Brief hospital course: Partly taken from H&P.  JOVAN COLLIGAN is a 78 y.o. male with medical history significant of A-fib on Eliquis , Parkinson's disease, HTN,  HLD, COPD, PVD, dCHD, stroke, hypothyroidism, bilateral carotid artery stenosis (s/p of stent of right carotid artery), myasthenia gravis, Merkel cell carcinoma, who presents with bilateral leg weakness, leg pain and swelling.   He was also having left leg numbness for about 2 months.  On presentation stable vitals with heart rate ranging between 48-60, labs with BNP of 1037, INR 1.6, CXR with elevation of right hemidiaphragm. CT head was negative for any acute intracranial abnormality.  Lower extremity venous Doppler negative for DVT but did show Baker's cyst  Patient was admitted for acute on chronic HFpEF.  Started on IV Lasix .  Also started on ceftriaxone  for concern of lower extremity cellulitis  10/1: Vital stable, troponin 35>33>25, creatinine with some improvement to 2.25, preliminary blood cultures negative.  Mildly elevated CRP at 2.2 and elevated ESR at 70.  PT is recommending SNF.  10/2: Hemodynamically stable, creatinine improving now at 2.01.  Improving lower extremity cellulitis so antibiotics switched with cefadroxil. Continuing IV Lasix  and TOC is looking for placement.  10/3: Remained hemodynamically stable, renal function continued to improve, will continue with IV Lasix  while TOC is working on placement.  10/4: Hemodynamically stable, renal function continue to improve so continuing IV diuresis.  Medically stable to go to rehab.  10/5: Remained hemodynamically stable and kidney functions continue to improve now creatinine at 1.41.  Awaiting SNF placement  10/6: Hemodynamically stable, small increase in creatinine, switching him from IV Lasix  to torsemide .   Still pending disposition.  Assessment and Plan: * Acute on chronic diastolic CHF (congestive heart failure) (HCC) Patient admitted with lower extremity edema and elevated BNP at 1037 consistent with CHF exacerbation.  Prior echo done in June 2025 with normal EF Creatinine continue to improve -Switching IV Lasix  with torsemide  - Continue with spironolactone  -Daily weight and BMP -Strict intake and output  Myocardial injury Mildly elevated troponin at 35 with a downward trend on subsequent check.  No chest pain.  Likely demand ischemia with CHF exacerbation.  Paroxysmal atrial fibrillation [facility]) Patient has borderline bradycardia. - Keep holding home amiodarone  and metoprolol  -Continue with Eliquis   History of stroke - On Eliquis  due to A-fib  Essential hypertension Blood pressure within lower goal. -Continue IV Lasix  and spironolactone   Myasthenia gravis (HCC) -Continue home prednisone  10 mg on Monday Wednesday Friday -  received Solu-Cortef 100 mg as a stress dose - Continue home azathioprine  50 mg daily  COPD (chronic obstructive pulmonary disease) (HCC) No acute concern. - Continue with as needed bronchodilators  Hypothyroidism - Continue with home Synthroid   Cellulitis of lower extremity Concern of bilateral lower extremity cellulitis with edema and erythema.  No leukocytosis, elevated CRP and ESR.  Preliminary blood cultures negative - Switching ceftriaxone  with cefadroxil to complete a 5-day course.  Acute renal failure superimposed on stage 3a chronic kidney disease (HCC) Most recent creatinine of 1.68 on 05/22/2024. It was 2.50 on admission, Likely cardiorenal with concern of CHF exacerbation.  Creatinine continue to improve with diuresis, now at 1.41 - Monitor renal function while he is being diuresed -Avoid nephrotoxin  Parkinson disease (HCC) - Continue Sinemet   Leg weakness Patient has worsening bilateral leg weakness for  more than 2 months, recently came  home from rehab.  MRI brain with no acute intracranial abnormality.  Some progression of chronic small vessel disease. - PT is recommending SNF -TOC consult  Obesity (BMI 30-39.9) Estimated body mass index is 31.79 kg/m as calculated from the following:   Height as of this encounter: 5' 10 (1.778 m).   Weight as of this encounter: 100.5 kg.   Patient with class I obesity. - Encouraged weight loss with exercise and healthy diet  Subjective: Patient was seen and examined today.  No new concern.  He wants to get out of the hospital.  Physical Exam: Vitals:   06/17/24 0500 06/17/24 0550 06/17/24 0829 06/17/24 1244  BP:  128/75 (!) 116/59 (!) 169/88  Pulse:  (!) 52 63 98  Resp:  18    Temp:  98.4 F (36.9 C) 98.3 F (36.8 C) (!) 97.5 F (36.4 C)  TempSrc:      SpO2:  98% 94% 98%  Weight: 95.3 kg     Height:       General.  Frail elderly man, in no acute distress. Pulmonary.  Lungs clear bilaterally, normal respiratory effort. CV.  Regular rate and rhythm, no JVD, rub or murmur. Abdomen.  Soft, nontender, nondistended, BS positive. CNS.  Alert and oriented .  No focal neurologic deficit. Extremities.  No edema, no cyanosis, pulses intact and symmetrical. Psychiatry.  Judgment and insight appears normal.     Data Reviewed: Prior data reviewed  Family Communication: Talked with daughter on phone.  Disposition: Status is: Inpatient Remains inpatient appropriate because: Severity of illness  Planned Discharge Destination: Skilled nursing facility  DVT prophylaxis.  Eliquis  Time spent: 42 minutes  This record has been created using Conservation officer, historic buildings. Errors have been sought and corrected,but may not always be located. Such creation errors do not reflect on the standard of care.   Author: Amaryllis Dare, MD 06/17/2024 2:38 PM  For on call review [email or web address].

## 2024-06-17 NOTE — Plan of Care (Signed)
  Problem: Education: Goal: Knowledge of General Education information will improve Description: Including pain rating scale, medication(s)/side effects and non-pharmacologic comfort measures Outcome: Progressing   Problem: Clinical Measurements: Goal: Respiratory complications will improve Outcome: Progressing   Problem: Clinical Measurements: Goal: Cardiovascular complication will be avoided Outcome: Progressing   Problem: Activity: Goal: Risk for activity intolerance will decrease Outcome: Progressing   Problem: Elimination: Goal: Will not experience complications related to bowel motility Outcome: Progressing   Problem: Pain Managment: Goal: General experience of comfort will improve and/or be controlled Outcome: Progressing   Problem: Safety: Goal: Ability to remain free from injury will improve Outcome: Progressing

## 2024-06-17 NOTE — Progress Notes (Signed)
 Occupational Therapy Treatment Patient Details Name: Francisco [name] MRN: [id number] DOB: 1946/06/26 Today's Date: 06/17/2024   History of present illness Pt is a 78 year old male presents with leg weakness, leg pain and swelling, admitted with Acute on chronic diastolic CHF, Possible cellulitis of lower extremity, Acute renal failure superimposed on stage 3a chronic kidney disease. PMH significant for A-fib on Eliquis , Parkinson's disease, HTN,  HLD, COPD, PVD, dCHD, stroke, hypothyroidism, bilateral carotid artery stenosis (s/p of stent of right carotid artery), myasthenia gravis, Merkel cell carcinoma   OT comments  Pt is supine in bed on arrival, pleasant and agreeable to OT/PT co-treatment session. He reports moderate pain to his L hip/thigh region. Pt performed bed mobility with SBA, increased time and use of bed features. He was able to scoot to EOB with SBA. Pt required Mod A X2 with cues for hand placement to stand from elevated bed height with L knee blocking to prevent buckling with progression to SPT to recliner with Mod A x2 and L knee buckles intermittently with weight bearing/fatigues easily. Additional stand from lower recliner height requiring Max A x2 with very limited standing tolerance and heavy reliance of BUEs on RW. He performed seated grooming tasks with set up assist.  Pt left in recliner with all needs in place and will cont to require skilled acute OT services to maximize his safety and IND to return to PLOF.       If plan is discharge home, recommend the following:  Help with stairs or ramp for entrance;Assistance with cooking/housework;Assist for transportation;Two people to help with walking and/or transfers;A lot of help with bathing/dressing/bathroom   Equipment Recommendations  Other (comment) (defer)    Recommendations for Other Services      Precautions / Restrictions Precautions Precautions: Fall Recall of Precautions/Restrictions:  Intact Restrictions Weight Bearing Restrictions Per Provider Order: No       Mobility Bed Mobility Overal bed mobility: Needs Assistance Bed Mobility: Supine to Sit     Supine to sit: Supervision, Used rails, HOB elevated     General bed mobility comments: use of bed features, but no physical assist needed    Transfers Overall transfer level: Needs assistance Equipment used: Rolling walker (2 wheels) Transfers: Sit to/from Stand, Bed to chair/wheelchair/BSC Sit to Stand: From elevated surface, Max assist, +2 safety/equipment, Mod assist Stand pivot transfers: Mod assist, +2 physical assistance, From elevated surface         General transfer comment: increased bed height able to stand with Mod A x2, however from recliner require Max A x2 and cues for hand placement; able to step pivot to recliner with blocking of LLE to prevent buckling with Mod A X2     Balance Overall balance assessment: Needs assistance Sitting-balance support: Feet supported Sitting balance-Leahy Scale: Good     Standing balance support: Bilateral upper extremity supported, During functional activity, Reliant on assistive device for balance Standing balance-Leahy Scale: Poor Standing balance comment: BUE support on RW to maintain standing                           ADL either performed or assessed with clinical judgement   ADL Overall ADL's : Needs assistance/impaired     Grooming: Sitting;Set up;Oral care;Wash/dry face;Wash/dry Programmer, applications Details (indicate cue type and reason): recliner level                 Toilet Transfer: Stand-pivot;Rolling walker (2 wheels);+2 for physical  assistance Toilet Transfer Details (indicate cue type and reason): simulated to recliner from EOB using RW via step pivot with Mod A x2                Extremity/Trunk Assessment              Vision       Perception     Praxis     Communication Communication Communication: No  apparent difficulties   Cognition Arousal: Alert Behavior During Therapy: WFL for tasks assessed/performed                                 Following commands: Intact        Cueing   Cueing Techniques: Verbal cues  Exercises      Shoulder Instructions       General Comments mild nausea this date with activity, emesis bag provided by therapists; intermitteny L knee buckling with fatigue/weight bearing    Pertinent Vitals/ Pain       Pain Assessment Pain Assessment: Faces Faces Pain Scale: Hurts little more Pain Location: left thigh, left hip Pain Descriptors / Indicators: Discomfort Pain Intervention(s): Limited activity within patient's tolerance, Repositioned, Monitored during session  Home Living                                          Prior Functioning/Environment              Frequency  Min 2X/week        Progress Toward Goals  OT Goals(current goals can now be found in the care plan section)  Progress towards OT goals: Progressing toward goals  Acute Rehab OT Goals Patient Stated Goal: improve strength and function OT Goal Formulation: With patient Time For Goal Achievement: 06/26/24 Potential to Achieve Goals: Good  Plan      Co-evaluation    PT/OT/SLP Co-Evaluation/Treatment: Yes Reason for Co-Treatment: Complexity of the patient's impairments (multi-system involvement);For patient/therapist safety PT goals addressed during session: Mobility/safety with mobility;Balance;Proper use of DME OT goals addressed during session: ADL's and self-care      AM-PAC OT 6 Clicks Daily Activity     Outcome Measure   Help from another person eating meals?: None Help from another person taking care of personal grooming?: A Little Help from another person toileting, which includes using toliet, bedpan, or urinal?: A Lot Help from another person bathing (including washing, rinsing, drying)?: A Lot Help from another person  to put on and taking off regular upper body clothing?: A Little Help from another person to put on and taking off regular lower body clothing?: A Lot 6 Click Score: 16    End of Session Equipment Utilized During Treatment: Rolling walker (2 wheels);Gait belt  OT Visit Diagnosis: Unsteadiness on feet (R26.81);Other abnormalities of gait and mobility (R26.89);Repeated falls (R29.6);Muscle weakness (generalized) (M62.81);Pain Pain - Right/Left: Left Pain - part of body: Leg   Activity Tolerance Patient tolerated treatment well   Patient Left with call bell/phone within reach;in chair   Nurse Communication Mobility status        Time: 8949-8885 OT Time Calculation (min): 24 min  Charges: OT General Charges $OT Visit: 1 Visit OT Treatments $Self Care/Home Management : 8-22 mins  Demetra Moya, OTR/L  06/17/24, 1:08 PM   Monicia Tse E Alondra Sahni 06/17/2024, 1:02 PM

## 2024-06-17 NOTE — TOC CM/SW Note (Signed)
 Transition of Care (TOC) CM/SW Note    [facility] - [facility] Brief [facility]   Patient Details  Name: GAEL LONDO MRN: [id number] Date of Birth: [birth year]  Transition of Care [facility]) CM/SW Contact:    Edsel DELENA Fischer, LCSW Phone Number: 06/17/2024, 12:14 PM   Clinical Narrative:  SNF auth still pending   Transition of Care Asessment:

## 2024-06-17 NOTE — Progress Notes (Signed)
 Physical Therapy Treatment Patient Details Name: KEYANTE DURIO MRN: [id number] DOB: 12-Dec-1945 Today's Date: 06/17/2024   History of Present Illness Pt is a 78 year old male presents with leg weakness, leg pain and swelling, admitted with Acute on chronic diastolic CHF, Possible cellulitis of lower extremity, Acute renal failure superimposed on stage 3a chronic kidney disease. PMH significant for A-fib on Eliquis , Parkinson's disease, HTN,  HLD, COPD, PVD, dCHD, stroke, hypothyroidism, bilateral carotid artery stenosis (s/p of stent of right carotid artery), myasthenia gravis, Merkel cell carcinoma    PT Comments  Patient is agreeable to PT session. He was able to stand with less assistance initially compared to prior session. Increased assistance with repeat stand due to fatigue and weakness. Left knee buckling with weight bearing. Therapeutic exercises performed from chair for strengthening. Recommend to continue PT to maximize independence. Rehabilitation < 3 hours/day recommended after this hospital stay.    If plan is discharge home, recommend the following: Two people to help with walking and/or transfers;A lot of help with bathing/dressing/bathroom;Help with stairs or ramp for entrance;Assistance with cooking/housework   Can travel by private vehicle     No  Equipment Recommendations  None recommended by PT    Recommendations for Other Services       Precautions / Restrictions Precautions Precautions: Fall Recall of Precautions/Restrictions: Intact Restrictions Weight Bearing Restrictions Per Provider Order: No     Mobility  Bed Mobility               General bed mobility comments: patient sitting edge of bed with OT on arrival to room    Transfers Overall transfer level: Needs assistance Equipment used: Rolling walker (2 wheels) Transfers: Sit to/from Stand, Bed to chair/wheelchair/BSC Sit to Stand: From elevated surface, Max assist, +2  safety/equipment Stand pivot transfers: Mod assist, +2 physical assistance         General transfer comment: patient required increased assistance with second standing bout. cues for hand placement for safety. left knee blocked intermittelty with step transfer to prevent buckling which is more pronounced with fatigue    Ambulation/Gait               General Gait Details: unable to at this time   Stairs             Wheelchair Mobility     Tilt Bed    Modified Rankin (Stroke Patients Only)       Balance Overall balance assessment: Needs assistance Sitting-balance support: Feet supported Sitting balance-Leahy Scale: Good     Standing balance support: Bilateral upper extremity supported, During functional activity, Reliant on assistive device for balance Standing balance-Leahy Scale: Poor Standing balance comment: external support required in addition to heavy reliance on rolling walker for support in standing                            Communication Communication Communication: No apparent difficulties  Cognition Arousal: Alert Behavior During Therapy: WFL for tasks assessed/performed   PT - Cognitive impairments: No apparent impairments                         Following commands: Intact      Cueing Cueing Techniques: Verbal cues  Exercises General Exercises - Lower Extremity Long Arc Quad: AAROM, Strengthening, AROM, 15 reps, Seated Other Exercises Other Exercises: AAROM to achieve full knee extension. gentle hamstring stretching provided LLE while seated in  chair. encouraged AROM as tolerated    General Comments General comments (skin integrity, edema, etc.): left knee buckling intermittently with weight bearing. mild nausea reported with activity      Pertinent Vitals/Pain Pain Assessment Pain Assessment: Faces Faces Pain Scale: Hurts little more Pain Location: left thigh, left hip Pain Descriptors / Indicators:  Discomfort Pain Intervention(s): Monitored during session, Limited activity within patient's tolerance, Repositioned    Home Living                          Prior Function            PT Goals (current goals can now be found in the care plan section) Acute Rehab PT Goals Patient Stated Goal: to get stronger PT Goal Formulation: With patient Time For Goal Achievement: 06/26/24 Potential to Achieve Goals: Fair Progress towards PT goals: Progressing toward goals    Frequency    Min 2X/week      PT Plan      Co-evaluation     PT goals addressed during session: Mobility/safety with mobility;Balance;Proper use of DME OT goals addressed during session: ADL's and self-care      AM-PAC PT 6 Clicks Mobility   Outcome Measure  Help needed turning from your back to your side while in a flat bed without using bedrails?: A Lot Help needed moving from lying on your back to sitting on the side of a flat bed without using bedrails?: A Lot Help needed moving to and from a bed to a chair (including a wheelchair)?: Total Help needed standing up from a chair using your arms (e.g., wheelchair or bedside chair)?: Total Help needed to walk in hospital room?: Total Help needed climbing 3-5 steps with a railing? : Total 6 Click Score: 8    End of Session Equipment Utilized During Treatment: Gait belt Activity Tolerance: Patient limited by fatigue Patient left: in chair;with call bell/phone within reach;with chair alarm set Nurse Communication: Mobility status PT Visit Diagnosis: Muscle weakness (generalized) (M62.81);Unsteadiness on feet (R26.81)     Time: 8945-8881 PT Time Calculation (min) (ACUTE ONLY): 24 min  Charges:    $Therapeutic Activity: 8-22 mins PT General Charges $$ ACUTE PT VISIT: 1 Visit                    Randine Essex, PT, MPT    Randine LULLA Essex 06/17/2024, 12:46 PM

## 2024-06-18 DIAGNOSIS — Z8673 Personal history of transient ischemic attack (TIA), and cerebral infarction without residual deficits: Secondary | ICD-10-CM | POA: Diagnosis not present

## 2024-06-18 DIAGNOSIS — I480 Paroxysmal atrial fibrillation: Secondary | ICD-10-CM | POA: Diagnosis not present

## 2024-06-18 DIAGNOSIS — I5A Non-ischemic myocardial injury (non-traumatic): Secondary | ICD-10-CM | POA: Diagnosis not present

## 2024-06-18 DIAGNOSIS — I5033 Acute on chronic diastolic (congestive) heart failure: Secondary | ICD-10-CM | POA: Diagnosis not present

## 2024-06-18 DIAGNOSIS — R310 Gross hematuria: Secondary | ICD-10-CM

## 2024-06-18 LAB — CBC
HCT: 38.9 % — ABNORMAL LOW (ref 39.0–52.0)
Hemoglobin: 12.7 g/dL — ABNORMAL LOW (ref 13.0–17.0)
MCH: 34 pg (ref 26.0–34.0)
MCHC: 32.6 g/dL (ref 30.0–36.0)
MCV: 104.3 fL — ABNORMAL HIGH (ref 80.0–100.0)
Platelets: 243 K/uL (ref 150–400)
RBC: 3.73 MIL/uL — ABNORMAL LOW (ref 4.22–5.81)
RDW: 14.1 % (ref 11.5–15.5)
WBC: 8.8 K/uL (ref 4.0–10.5)
nRBC: 0 % (ref 0.0–0.2)

## 2024-06-18 LAB — URINALYSIS, COMPLETE (UACMP) WITH MICROSCOPIC: RBC / HPF: >50 RBC/hpf (ref 0–5)

## 2024-06-18 LAB — MAGNESIUM: Magnesium: 2.5 mg/dL — ABNORMAL HIGH (ref 1.7–2.4)

## 2024-06-18 MED ORDER — METOPROLOL TARTRATE 25 MG PO TABS
25.0000 mg | ORAL_TABLET | Freq: Two times a day (BID) | ORAL | Status: DC
  Administered 2024-06-18: 25 mg via ORAL
  Filled 2024-06-18: qty 1

## 2024-06-18 MED ORDER — AMIODARONE HCL 200 MG PO TABS
400.0000 mg | ORAL_TABLET | Freq: Two times a day (BID) | ORAL | Status: DC
  Administered 2024-06-18: 400 mg via ORAL
  Filled 2024-06-18: qty 2

## 2024-06-18 MED ORDER — AMIODARONE HCL 200 MG PO TABS: 200.0000 mg | ORAL_TABLET | Freq: Every day | ORAL | Status: DC

## 2024-06-18 NOTE — Assessment & Plan Note (Addendum)
 Blood pressure within lower goal. -Continue torsemide  and spironolactone 

## 2024-06-18 NOTE — Progress Notes (Signed)
 Progress Note   Patient: Francisco [name] FMW:[id number] DOB: November 17, 1945 DOA: 06/11/2024     7 DOS: the patient was seen and examined on 06/18/2024   Brief hospital course: Partly taken from H&P.  Francisco [name] is a 78 y.o. male with medical history significant of A-fib on Eliquis , Parkinson's disease, HTN,  HLD, COPD, PVD, dCHD, stroke, hypothyroidism, bilateral carotid artery stenosis (s/p of stent of right carotid artery), myasthenia gravis, Merkel cell carcinoma, who presents with bilateral leg weakness, leg pain and swelling.   He was also having left leg numbness for about 2 months.  On presentation stable vitals with heart rate ranging between 48-60, labs with BNP of 1037, INR 1.6, CXR with elevation of right hemidiaphragm. CT head was negative for any acute intracranial abnormality.  Lower extremity venous Doppler negative for DVT but did show Baker's cyst  Patient was admitted for acute on chronic HFpEF.  Started on IV Lasix .  Also started on ceftriaxone  for concern of lower extremity cellulitis  10/1: Vital stable, troponin 35>33>25, creatinine with some improvement to 2.25, preliminary blood cultures negative.  Mildly elevated CRP at 2.2 and elevated ESR at 70.  PT is recommending SNF.  10/2: Hemodynamically stable, creatinine improving now at 2.01.  Improving lower extremity cellulitis so antibiotics switched with cefadroxil. Continuing IV Lasix  and TOC is looking for placement.  10/3: Remained hemodynamically stable, renal function continued to improve, will continue with IV Lasix  while TOC is working on placement.  10/4: Hemodynamically stable, renal function continue to improve so continuing IV diuresis.  Medically stable to go to rehab.  10/5: Remained hemodynamically stable and kidney functions continue to improve now creatinine at 1.41.  Awaiting SNF placement  10/6: Hemodynamically stable, small increase in creatinine, switching him from IV Lasix  to torsemide .   Still pending disposition.  10/7: Obtained insurance authorization for rehab but patient developed a flutter between 1 conduction with RVR, cardiology was consulted and he was given loading dose of amiodarone .  Later also developed gross hematuria so holding Eliquis  for now and monitoring CBC.  Assessment and Plan: * Acute on chronic diastolic CHF (congestive heart failure) (HCC) Patient admitted with lower extremity edema and elevated BNP at 1037 consistent with CHF exacerbation.  Prior echo done in June 2025 with normal EF Creatinine continue to improve -Switching IV Lasix  with torsemide  - Continue with spironolactone  -Daily weight and BMP -Strict intake and output  Myocardial injury Mildly elevated troponin at 35 with a downward trend on subsequent check.  No chest pain.  Likely demand ischemia with CHF exacerbation.  Gross hematuria Patient had an episode of gross hematuria with a large clot in it. -Check CBC -Holding Eliquis  -Continue to monitor  Paroxysmal atrial fibrillation (HCC) Patient has borderline bradycardia for most of the stay and this morning had an episode of atrial flutter between 1 conduction and RVR. - Cardiology was consulted and patient was started on loading dose of amiodarone . - Holding Eliquis  for concern of gross hematuria started this morning  History of stroke - On Eliquis  due to A-fib  Essential hypertension Blood pressure within lower goal. -Continue torsemide  and spironolactone   Myasthenia gravis (HCC) -Continue home prednisone  10 mg on Monday Wednesday Friday -  received Solu-Cortef 100 mg as a stress dose - Continue home azathioprine  50 mg daily  COPD (chronic obstructive pulmonary disease) (HCC) No acute concern. - Continue with as needed bronchodilators  Hypothyroidism - Continue with home Synthroid   Cellulitis of lower extremity Concern of bilateral lower  extremity cellulitis with edema and erythema.  No leukocytosis, elevated CRP  and ESR.  Preliminary blood cultures negative - Switching ceftriaxone  with cefadroxil to complete a 5-day course.  Acute renal failure superimposed on stage 3a chronic kidney disease (HCC) Most recent creatinine of 1.68 on 05/22/2024. It was 2.50 on admission, Likely cardiorenal with concern of CHF exacerbation.  Creatinine continue to improve with diuresis, now at 1.41 - Monitor renal function while he is being diuresed -Avoid nephrotoxin  Parkinson disease (HCC) - Continue Sinemet   Leg weakness Patient has worsening bilateral leg weakness for more than 2 months, recently came home from rehab.  MRI brain with no acute intracranial abnormality.  Some progression of chronic small vessel disease. - PT is recommending SNF -TOC consult  Obesity (BMI 30-39.9) Estimated body mass index is 31.79 kg/m as calculated from the following:   Height as of this encounter: 5' 10 (1.778 m).   Weight as of this encounter: 100.5 kg.   Patient with class I obesity. - Encouraged weight loss with exercise and healthy diet  Subjective: Patient was seen and examined today.  Denies any chest pain or shortness of breath.  Physical Exam: Vitals:   06/17/24 2335 06/18/24 0500 06/18/24 0825 06/18/24 1210  BP: 111/65  109/79 116/81  Pulse: (!) 51  (!) 118 (!) 113  Resp: 17  18 18   Temp: 98.4 F (36.9 C)  97.9 F (36.6 C) (!) 97.5 F (36.4 C)  TempSrc:   Oral Oral  SpO2: 98%  95% 96%  Weight:  92.4 kg    Height:       General.  Frail elderly man, in no acute distress. Pulmonary.  Lungs clear bilaterally, normal respiratory effort. CV.  Irregular with tachycardia Abdomen.  Soft, nontender, nondistended, BS positive. CNS.  Alert and oriented .  No focal neurologic deficit. Extremities.  No edema, no cyanosis, pulses intact and symmetrical. Psychiatry.  Judgment and insight appears normal.      Data Reviewed: Prior data reviewed  Family Communication: Talked with daughter on  phone.  Disposition: Status is: Inpatient Remains inpatient appropriate because: Severity of illness  Planned Discharge Destination: Skilled nursing facility  DVT prophylaxis.  Eliquis  Time spent: 50 minutes  This record has been created using Conservation officer, historic buildings. Errors have been sought and corrected,but may not always be located. Such creation errors do not reflect on the standard of care.   Author: Amaryllis Dare, MD 06/18/2024 2:01 PM  For on call review [email or web address].

## 2024-06-18 NOTE — Plan of Care (Signed)
  Problem: Education: Goal: Ability to demonstrate management of disease process will improve Outcome: Progressing Goal: Ability to verbalize understanding of medication therapies will improve Outcome: Progressing Goal: Individualized Educational Video(s) Outcome: Progressing   Problem: Activity: Goal: Capacity to carry out activities will improve Outcome: Progressing   Problem: Cardiac: Goal: Ability to achieve and maintain adequate cardiopulmonary perfusion will improve Outcome: Progressing   Problem: Clinical Measurements: Goal: Ability to avoid or minimize complications of infection will improve Outcome: Progressing   Problem: Skin Integrity: Goal: Skin integrity will improve Outcome: Progressing   Problem: Education: Goal: Knowledge of General Education information will improve Description: Including pain rating scale, medication(s)/side effects and non-pharmacologic comfort measures Outcome: Progressing   Problem: Health Behavior/Discharge Planning: Goal: Ability to manage health-related needs will improve Outcome: Progressing   Problem: Clinical Measurements: Goal: Ability to maintain clinical measurements within normal limits will improve Outcome: Progressing Goal: Will remain free from infection Outcome: Progressing Goal: Diagnostic test results will improve Outcome: Progressing Goal: Respiratory complications will improve Outcome: Progressing Goal: Cardiovascular complication will be avoided Outcome: Progressing   Problem: Activity: Goal: Risk for activity intolerance will decrease Outcome: Progressing   Problem: Nutrition: Goal: Adequate nutrition will be maintained Outcome: Progressing   Problem: Coping: Goal: Level of anxiety will decrease Outcome: Progressing   Problem: Elimination: Goal: Will not experience complications related to bowel motility Outcome: Progressing Goal: Will not experience complications related to urinary  retention Outcome: Progressing   Problem: Pain Managment: Goal: General experience of comfort will improve and/or be controlled Outcome: Progressing   Problem: Safety: Goal: Ability to remain free from injury will improve Outcome: Progressing   Problem: Skin Integrity: Goal: Risk for impaired skin integrity will decrease Outcome: Progressing

## 2024-06-18 NOTE — Assessment & Plan Note (Signed)
 Patient had an episode of gross hematuria with a large clot in it. -Check CBC -Holding Eliquis  -Continue to monitor

## 2024-06-18 NOTE — Consult Note (Signed)
 [facility] CLINIC CARDIOLOGY CONSULT NOTE       Patient ID: Francisco [name] MRN: [id number] DOB/AGE: 13-Jan-1946 78 y.o.  Admit date: 06/11/2024 Referring Physician Dr. Amaryllis Dare Primary Physician Fernande Ophelia JINNY DOUGLAS, MD  Primary Cardiologist Dr. Wilburn Reason for Consultation AF RVR  HPI: [name] is a 78 y.o. male  with a past medical history of persistent atrial fibrillation (recent successful TEE/DCCV on 06/12, HX ablation in 2023), hx paroxsymal SVT, chronic HFpEF (predominately right sided HF), severe TR, moderate pulmonary hypertension, hypertension, hyperlipidemia, myasthenia gravis, COPD who presented to the ED on 06/11/2024 for bilateral leg weakness, leg pain and swelling. Was treated for acute heart failure during admission. Was going to discharge to [facility] today however he developed AF RVR. Cardiology was consulted for further evaluation.   Patient initially came to the ED on 06/11/24 for worsening LE weakness, leg pain, and swelling. Notable labs from today include creatinine 1.53, potassium 4.1, . Troponins on admission 35 > 33 > 26, BNP 1037. EKG in the ED NSR rate 71 bpm. Was diuresed during admission, on the day of anticipated discharge he developed atrial fibrillation RVR. EKG today   At the time of my evaluation, patient is resting in hospital bed.  He states that he is feeling tired today and overall is just tired of being in the hospital.  He denies any palpitations, chest pain, shortness of breath.  States that overall his leg swelling has gotten better.  He cannot tell that his heart rate is elevated.  Upon being placed on telemetry heart rates consistently elevated in the 110s, appears to be atrial flutter.  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   Carotid stenosis    CHF (congestive heart failure) (HCC)    CKD (chronic kidney disease)    CKD (chronic kidney disease) stage 3, GFR 30-59 ml/min (HCC)    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   DDD (degenerative disc disease), cervical    Dysrhythmia    post carotid stent bradycardia; PAF 09/2020   GERD (gastroesophageal reflux disease)    Hypercholesterolemia    Hypertension    Hypothyroidism    pt takes Levothyroxine  daily   Lumbosacral spinal stenosis    Myasthenia gravis, adult form (HCC)    PAD (peripheral artery disease)    Parkinson disease (HCC)    Shortness of breath    Lung MD- Dr Burnett Servant   Sleep apnea    do not use CPAP every night   Stroke (HCC) 05/31/2023    Past Surgical History:  Procedure Laterality Date   ANTERIOR CERVICAL DECOMP/DISCECTOMY FUSION  07/18/2011   Procedure: ANTERIOR CERVICAL DECOMPRESSION/DISCECTOMY FUSION 2 LEVELS;  Surgeon: Victory LABOR Pool;  Location: [facility];  Service: Neurosurgery;  Laterality: N/A;  cervical five-six, cervical six-seven anterior cervical discectomy and fusion   ATRIAL FIBRILLATION ABLATION     BACK SURGERY     in 1985 [facility]   BILATERAL CARPAL TUNNEL RELEASE     01/2020 Right, 04/2020 Left  CARDIAC CATHETERIZATION     2005 at [facility], no stents   CARDIOVERSION N/A 02/22/2024   Procedure: CARDIOVERSION;  Surgeon: Alluri, Keller BROCKS, MD;  Location: [facility] ORS;  Service: Cardiovascular;  Laterality: N/A;   CAROTID PTA/STENT INTERVENTION N/A 09/17/2020   Procedure: CAROTID PTA/STENT INTERVENTION;  Surgeon: Marea Selinda RAMAN, MD;  Location: [facility] INVASIVE CV LAB;  Service: Cardiovascular;  Laterality: N/A;   CATARACT EXTRACTION W/PHACO Left 01/06/2020   Procedure: CATARACT EXTRACTION PHACO AND INTRAOCULAR LENS PLACEMENT (IOC) ISTENT INJ LEFT 3.81  00:33.3;  Surgeon: Myrna Adine Anes, MD;  Location: [facility];  Service: Ophthalmology;  Laterality: Left;   CATARACT EXTRACTION W/PHACO Right 02/03/2020   Procedure: CATARACT EXTRACTION PHACO AND INTRAOCULAR LENS PLACEMENT (IOC) RIGHT ISTENT INJ;  Surgeon: Myrna Adine Anes, MD;  Location: [facility];  Service: Ophthalmology;  Laterality: Right;  4.29 0:35.6   COLONOSCOPY     HERNIA REPAIR Left    inguinal hernia repair in 1985   LUMBAR LAMINECTOMY/DECOMPRESSION MICRODISCECTOMY Left 02/24/2014   Procedure: LUMBAR LAMINECTOMY/DECOMPRESSION MICRODISCECTOMY LUMBAR THREE-FOUR, FOUR-FIVE, LEFT FIVE-SACRAL ONE ;  Surgeon: Victory DELENA Gunnels, MD;  Location: [facility];  Service: Neurosurgery;  Laterality: Left;  LUMBAR LAMINECTOMY/DECOMPRESSION MICRODISCECTOMY LUMBAR THREE-FOUR, FOUR-FIVE, LEFT FIVE-SACRAL ONE    LUMBAR LAMINECTOMY/DECOMPRESSION MICRODISCECTOMY N/A 05/03/2021   Procedure: Laminectomy and Foraminotomy - L2-L3;  Surgeon: Gunnels Victory, MD;  Location: [facility] OR;  Service: Neurosurgery;  Laterality: N/A;  3C   POSTERIOR CERVICAL FUSION/FORAMINOTOMY N/A 08/07/2020   Procedure: C3-6 POSTERIOR FUSION WITH DECOMPRESSION;  Surgeon: Clois Fret, MD;  Location: [facility] ORS;  Service: Neurosurgery;  Laterality: N/A;   PROSTATECTOMY  04/2013   [facility] Dr ozell Geralds    TEE WITHOUT CARDIOVERSION N/A 02/22/2024   Procedure: ECHOCARDIOGRAM, TRANSESOPHAGEAL;  Surgeon: Alluri, Keller BROCKS, MD;  Location: [facility] ORS;  Service: Cardiovascular;  Laterality: N/A;    Medications Prior to Admission  Medication Sig Dispense Refill Last Dose/Taking   acetaminophen  (TYLENOL ) 325 MG tablet Take 2 tablets (650 mg total) by mouth every 6 (six) hours as needed for mild pain (pain score 1-3) or fever (or Fever >/= 101).   Past Month   apixaban  (ELIQUIS ) 5 MG TABS tablet Take 5 mg by mouth 2 (two) times daily.   06/12/2024 Morning   azaTHIOprine  (IMURAN ) 50 MG tablet Take 150 mg by mouth daily.    06/12/2024 Morning    carbidopa -levodopa  (SINEMET  IR) 25-100 MG tablet Take 1 tablet by mouth 3 (three) times daily.   06/12/2024 Morning   Cholecalciferol  (D3-1000) 25 MCG (1000 UT) capsule Take 2,000 Units by mouth daily.   06/12/2024 Morning   cyanocobalamin  (VITAMIN B12) 1000 MCG tablet Take 1,000 mcg by mouth daily.   06/12/2024 Morning   HYDROcodone -acetaminophen  (NORCO) 10-325 MG tablet Take 1-2 tablets by mouth every 6 (six) hours as needed for moderate pain (pain score 4-6) or severe pain (pain score 7-10). 10 tablet 0 06/12/2024 Morning   levothyroxine  (SYNTHROID ) 50 MCG tablet Take 1 tablet (50 mcg total) by mouth daily at 6 (six) AM. 30 tablet 0 06/12/2024 Morning   meclizine  (ANTIVERT ) 25 MG tablet Take 1 tablet by mouth 3 (three) times daily as needed for dizziness.   06/12/2024 Morning   polyethylene glycol (MIRALAX  / GLYCOLAX ) 17 g packet Take 17 g by mouth daily. Skip the dose if no constipation   Past Week   predniSONE  (DELTASONE ) 10 MG tablet Take 10 mg by mouth every Monday, Wednesday, and Friday.   06/11/2024   spironolactone  (  ALDACTONE ) 25 MG tablet Take 1 tablet (25 mg total) by mouth daily.   06/12/2024 Morning   amiodarone  (PACERONE ) 200 MG tablet Take 1 tablet (200 mg total) by mouth daily. (Patient not taking: Reported on 06/12/2024)   Not Taking   bumetanide  (BUMEX ) 2 MG tablet Take 2 mg by mouth daily. (Patient not taking: Reported on 06/12/2024)   Not Taking   dapagliflozin  propanediol (FARXIGA ) 10 MG TABS tablet Take 1 tablet (10 mg total) by mouth daily. (Patient not taking: Reported on 06/12/2024)   Not Taking   pregabalin  (LYRICA ) 50 MG capsule Take 50 mg by mouth 2 (two) times daily. (Patient not taking: Reported on 06/12/2024)   Not Taking   Social History   Socioeconomic History   Marital status: Widowed    Spouse name: [name]   Number of children: Not on file   Years of education: Not on file   Highest education level: Not on file  Occupational History   Not on file  Tobacco Use   Smoking  status: Former    Current packs/day: 0.00    Average packs/day: 1 pack/day for 20.0 years (20.0 ttl pk-yrs)    Types: Cigarettes    Start date: 09/12/1981    Quit date: 09/12/2001    Years since quitting: 22.7   Smokeless tobacco: Never  Vaping Use   Vaping status: Never Used  Substance and Sexual Activity   Alcohol use: Yes    Alcohol/week: 3.0 standard drinks of alcohol    Types: 3 Glasses of wine per week    Comment: 3 glasses a wine a week   Drug use: No   Sexual activity: Yes  Other Topics Concern   Not on file  Social History Narrative   Not on file   Social Drivers of Health   Financial Resource Strain: Low Risk  (04/30/2024)   [facility] from [facility]   Overall Financial Resource Strain (CARDIA)    Difficulty of Paying Living Expenses: Not hard at all  Food Insecurity: No Food Insecurity (06/12/2024)   Hunger Vital Sign    Worried About Running Out of Food in the Last Year: Never true    Ran Out of Food in the Last Year: Never true  Transportation Needs: No Transportation Needs (06/12/2024)   PRAPARE - Administrator, Civil Service (Medical): No    Lack of Transportation (Non-Medical): No  Recent Concern: Transportation Needs - Unmet Transportation Needs (05/17/2024)   PRAPARE - Administrator, Civil Service (Medical): Yes    Lack of Transportation (Non-Medical): Yes  Physical Activity: Not on file  Stress: Not on file  Social Connections: Moderately Isolated (06/12/2024)   Social Connection and Isolation Panel    Frequency of Communication with Friends and Family: Once a week    Frequency of Social Gatherings with Friends and Family: Once a week    Attends Religious Services: More than 4 times per year    Active Member of [employer] or Organizations: Yes    Attends Banker Meetings: More than 4 times per year    Marital Status: Widowed  Intimate Partner Violence: Not At Risk (06/12/2024)   Humiliation, Afraid, Rape, and  Kick questionnaire    Fear of Current or Ex-Partner: No    Emotionally Abused: No    Physically Abused: No    Sexually Abused: No    Family History  Problem Relation Age of Onset   Hypertension Mother  Stroke Mother    Stroke Father      Vitals:   06/17/24 1941 06/17/24 2335 06/18/24 0500 06/18/24 0825  BP: 126/64 111/65  109/79  Pulse: (!) 58 (!) 51  (!) 118  Resp: 17 17  18   Temp: 98.6 F (37 C) 98.4 F (36.9 C)  97.9 F (36.6 C)  TempSrc:      SpO2: 98% 98%  95%  Weight:   92.4 kg   Height:        PHYSICAL EXAM General: Chronically ill-appearing elderly male, well nourished, in no acute distress. HEENT: Normocephalic and atraumatic. Neck: No JVD.  Lungs: Normal respiratory effort on room air. Clear bilaterally to auscultation. No wheezes, crackles, rhonchi.  Heart: Irregularly irregular, elevated rate. Normal S1 and S2 without gallops or murmurs.  Abdomen: Non-distended appearing.  Msk: Normal strength and tone for age. Extremities: Warm and well perfused. No clubbing, cyanosis.  No edema.  Neuro: Alert and oriented X 3. Psych: Answers questions appropriately.   Labs: Basic Metabolic Panel: Recent Labs    06/16/24 0350 06/17/24 0421  NA 142 141  K 3.4* 4.1  CL 100 98  CO2 30 31  GLUCOSE 88 105*  BUN 37* 35*  CREATININE 1.41* 1.53*  CALCIUM  8.9 9.2   Liver Function Tests: No results for input(s): AST, ALT, ALKPHOS, BILITOT, PROT, ALBUMIN  in the last 72 hours. No results for input(s): LIPASE, AMYLASE in the last 72 hours. CBC: No results for input(s): WBC, NEUTROABS, HGB, HCT, MCV, PLT in the last 72 hours. Cardiac Enzymes: No results for input(s): CKTOTAL, CKMB, CKMBINDEX, TROPONINIHS in the last 72 hours. BNP: No results for input(s): BNP in the last 72 hours. D-Dimer: No results for input(s): DDIMER in the last 72 hours. Hemoglobin A1C: No results for input(s): HGBA1C in the last 72 hours. Fasting  Lipid Panel: No results for input(s): CHOL, HDL, LDLCALC, TRIG, CHOLHDL, LDLDIRECT in the last 72 hours. Thyroid  Function Tests: No results for input(s): TSH, T4TOTAL, T3FREE, THYROIDAB in the last 72 hours.  Invalid input(s): FREET3 Anemia Panel: No results for input(s): VITAMINB12, FOLATE, FERRITIN, TIBC, IRON , RETICCTPCT in the last 72 hours.   Radiology: ECHOCARDIOGRAM COMPLETE Result Date: 06/14/2024    ECHOCARDIOGRAM REPORT   Patient Name:   KENN [name] Date of Exam: 06/13/2024 Medical Rec #:  [id number]          Height:       70.0 in Accession #:    [id number]         Weight:       207.0 lb Date of Birth:  10-22-1945          BSA:          2.118 m Patient Age:    7 years           BP:           117/56 mmHg Patient Gender: M                  HR:           55 bpm. Exam Location:  [facility] Procedure: 2D Echo, Color Doppler and Cardiac Doppler (Both Spectral and Color            Flow Doppler were utilized during procedure). Indications:     Abnormal ECG R94.31  History:         Patient has prior history of Echocardiogram examinations, most  recent 06/01/2023. Abnormal ECG.  Sonographer:     Ashley McNeely-Sloane Referring Phys:  [id number] SUMAYYA AMIN Diagnosing Phys: Sabina Custovic IMPRESSIONS  1. Left ventricular ejection fraction, by estimation, is 60 to 65%. The left ventricle has normal function. The left ventricle has no regional wall motion abnormalities. Left ventricular diastolic parameters are consistent with Grade I diastolic dysfunction (impaired relaxation).  2. Right ventricular systolic function is low normal. The right ventricular size is moderately enlarged. There is normal pulmonary artery systolic pressure. The estimated right ventricular systolic pressure is 33.6 mmHg.  3. Left atrial size was mildly dilated.  4. Right atrial size was severely dilated.  5. The mitral valve is normal in structure. No evidence of mitral valve  regurgitation. No evidence of mitral stenosis.  6. Tricuspid valve regurgitation is mild to moderate.  7. The aortic valve is normal in structure. Aortic valve regurgitation is not visualized. No aortic stenosis is present.  8. The inferior vena cava is normal in size with greater than 50% respiratory variability, suggesting right atrial pressure of 3 mmHg. FINDINGS  Left Ventricle: Left ventricular ejection fraction, by estimation, is 60 to 65%. The left ventricle has normal function. The left ventricle has no regional wall motion abnormalities. The left ventricular internal cavity size was normal in size. There is  no left ventricular hypertrophy. Left ventricular diastolic parameters are consistent with Grade I diastolic dysfunction (impaired relaxation). Right Ventricle: The right ventricular size is moderately enlarged. No increase in right ventricular wall thickness. Right ventricular systolic function is low normal. There is normal pulmonary artery systolic pressure. The tricuspid regurgitant velocity  is 2.53 m/s, and with an assumed right atrial pressure of 8 mmHg, the estimated right ventricular systolic pressure is 33.6 mmHg. Left Atrium: Left atrial size was mildly dilated. Right Atrium: Right atrial size was severely dilated. Pericardium: There is no evidence of pericardial effusion. Mitral Valve: The mitral valve is normal in structure. No evidence of mitral valve regurgitation. No evidence of mitral valve stenosis. MV peak gradient, 4.0 mmHg. The mean mitral valve gradient is 1.0 mmHg. Tricuspid Valve: The tricuspid valve is normal in structure. Tricuspid valve regurgitation is mild to moderate. Aortic Valve: The aortic valve is normal in structure. Aortic valve regurgitation is not visualized. No aortic stenosis is present. Aortic valve mean gradient measures 4.0 mmHg. Aortic valve peak gradient measures 7.8 mmHg. Aortic valve area, by VTI measures 2.22 cm. Pulmonic Valve: The pulmonic valve was  normal in structure. Pulmonic valve regurgitation is not visualized. Aorta: The aortic root is normal in size and structure. Venous: The inferior vena cava is normal in size with greater than 50% respiratory variability, suggesting right atrial pressure of 3 mmHg. IAS/Shunts: No atrial level shunt detected by color flow Doppler.  LEFT VENTRICLE PLAX 2D LVIDd:         4.20 cm     Diastology LVIDs:         2.20 cm     LV e' medial:    15.00 cm/s LV PW:         1.50 cm     LV E/e' medial:  6.3 LV IVS:        1.20 cm     LV e' lateral:   11.40 cm/s LVOT diam:     2.00 cm     LV E/e' lateral: 8.3 LV SV:         63 LV SV Index:   30 LVOT Area:  3.14 cm  LV Volumes (MOD) LV vol d, MOD A2C: 33.6 ml LV vol d, MOD A4C: 37.0 ml LV vol s, MOD A2C: 17.1 ml LV vol s, MOD A4C: 14.4 ml LV SV MOD A2C:     16.5 ml LV SV MOD A4C:     37.0 ml LV SV MOD BP:      22.0 ml RIGHT VENTRICLE RV Basal diam:  5.50 cm RV Mid diam:    4.30 cm RV S prime:     11.90 cm/s TAPSE (M-mode): 1.8 cm LEFT ATRIUM           Index        RIGHT ATRIUM           Index LA diam:      4.30 cm 2.03 cm/m   RA Area:     27.30 cm LA Vol (A4C): 47.4 ml 22.38 ml/m  RA Volume:   92.90 ml  43.86 ml/m  AORTIC VALVE                    PULMONIC VALVE AV Area (Vmax):    2.22 cm     PV Vmax:        0.68 m/s AV Area (Vmean):   2.03 cm     PV Vmean:       44.900 cm/s AV Area (VTI):     2.22 cm     PV VTI:         0.108 m AV Vmax:           140.00 cm/s  PV Peak grad:   1.9 mmHg AV Vmean:          92.400 cm/s  PV Mean grad:   1.0 mmHg AV VTI:            0.285 m      RVOT Peak grad: 2 mmHg AV Peak Grad:      7.8 mmHg AV Mean Grad:      4.0 mmHg LVOT Vmax:         99.00 cm/s LVOT Vmean:        59.700 cm/s LVOT VTI:          0.201 m LVOT/AV VTI ratio: 0.71  AORTA Ao Root diam: 3.50 cm Ao Asc diam:  3.00 cm MITRAL VALVE               TRICUSPID VALVE MV Area (PHT): 2.80 cm    TR Peak grad:   25.6 mmHg MV Area VTI:   1.91 cm    TR Mean grad:   19.0 mmHg MV Peak grad:  4.0  mmHg    TR Vmax:        253.00 cm/s MV Mean grad:  1.0 mmHg    TR Vmean:       209.0 cm/s MV Vmax:       1.00 m/s MV Vmean:      50.3 cm/s   SHUNTS MV Decel Time: 271 msec    Systemic VTI:  0.20 m MV E velocity: 94.30 cm/s  Systemic Diam: 2.00 cm                            Pulmonic VTI:  0.117 m Annalee Custovic Electronically signed by Annalee Casa Signature Date/Time: 06/14/2024/8:41:18 AM    Final    MR BRAIN WO CONTRAST Result Date: 06/12/2024 CLINICAL DATA:  78 year old male with neurologic deficit, increasing  lower extremity weakness. Paroxysmal atrial fibrillation. EXAM: MRI HEAD WITHOUT CONTRAST TECHNIQUE: Multiplanar, multiecho pulse sequences of the brain and surrounding structures were obtained without intravenous contrast. COMPARISON:  Head CT yesterday.  Brain MRI 05/29/2023, and earlier. FINDINGS: Brain: Stable cerebral volume since last year. No restricted diffusion to suggest acute infarction. No midline shift, mass effect, evidence of mass lesion, ventriculomegaly, extra-axial collection or acute intracranial hemorrhage. Cervicomedullary junction and pituitary are within normal limits. Redemonstrated chronic small area of cortical encephalomalacia right middle frontal gyrus (series 9, image 39). Occasional chronic microhemorrhage in both cerebral hemispheres (minimal for age). Patchy bilateral periventricular white matter T2 and FLAIR hyperintensity which has progressed since a 2021 MRI, stable from last year and moderate for age. Mild to moderate chronic T2 heterogeneity also in the pons, also progressed since 2021. No other cortical encephalomalacia identified. Stable mild for age T2 heterogeneity in the deep gray nuclei. Cerebellum appears negative. Vascular: Major intracranial vascular flow voids are stable. Mild generalized intracranial artery tortuosity. Skull and upper cervical spine: Chronic cervical spine fusion, partially visible hardware artifact. Background bone marrow signal appears  normal, degenerative ligamentous hypertrophy about the odontoid process does not appear significantly changed. Sinuses/Orbits: Stable, negative. Other: Mastoids well aerated.  Negative visible scalp and face. IMPRESSION: 1. No acute intracranial abnormality. 2. Chronic small and medium-sized vessel ischemia, overall moderate for age. Chronic small vessel changes do appear progressed in the cerebral white matter and pons since a 2021 MRI, but stable brain compared to MRI last year. 3. Chronic cervical spine operative fusion. Electronically Signed   By: VEAR Hurst M.D.   On: 06/12/2024 04:28   DG Chest Port 1 View Result Date: 06/11/2024 CLINICAL DATA:  Edema. EXAM: PORTABLE CHEST 1 VIEW COMPARISON:  03/27/2024, CT 05/17/2024 FINDINGS: Chronic elevation of right hemidiaphragm with adjacent bandlike scarring. Mild cardiomegaly stable. No pulmonary edema. No significant pleural effusion. No acute airspace disease. No pneumothorax. IMPRESSION: 1. No acute chest findings. 2. Chronic elevation of right hemidiaphragm with adjacent scarring. Electronically Signed   By: Andrea Gasman M.D.   On: 06/11/2024 23:38   CT Head Wo Contrast Result Date: 06/11/2024 CLINICAL DATA:  Increased lower extremity weakness. EXAM: CT HEAD WITHOUT CONTRAST TECHNIQUE: Contiguous axial images were obtained from the base of the skull through the vertex without intravenous contrast. RADIATION DOSE REDUCTION: This exam was performed according to the departmental dose-optimization program which includes automated exposure control, adjustment of the mA and/or kV according to patient size and/or use of iterative reconstruction technique. COMPARISON:  May 17, 2024 FINDINGS: Brain: No evidence of acute infarction, hemorrhage, hydrocephalus, extra-axial collection or mass lesion/mass effect. Vascular: Moderate to marked severity bilateral cavernous carotid artery calcification is noted. Skull: Normal. Negative for fracture or focal lesion.  Sinuses/Orbits: No acute finding. Other: None. IMPRESSION: No acute intracranial abnormality. Electronically Signed   By: Suzen Dials M.D.   On: 06/11/2024 23:27   US  Venous Img Lower Bilateral Result Date: 06/11/2024 CLINICAL DATA:  Bilateral leg pain. EXAM: BILATERAL LOWER EXTREMITY VENOUS DOPPLER ULTRASOUND TECHNIQUE: Gray-scale sonography with graded compression, as well as color Doppler and duplex ultrasound were performed to evaluate the lower extremity deep venous systems from the level of the common femoral vein and including the common femoral, femoral, profunda femoral, popliteal and calf veins including the posterior tibial, peroneal and gastrocnemius veins when visible. The superficial great saphenous vein was also interrogated. Spectral Doppler was utilized to evaluate flow at rest and with distal augmentation maneuvers in the common  femoral, femoral and popliteal veins. COMPARISON:  May 17, 2024 FINDINGS: RIGHT LOWER EXTREMITY Common Femoral Vein: No evidence of thrombus. Normal compressibility, respiratory phasicity and response to augmentation. Saphenofemoral Junction: No evidence of thrombus. Normal compressibility and flow on color Doppler imaging. Profunda Femoral Vein: No evidence of thrombus. Normal compressibility and flow on color Doppler imaging. Femoral Vein: No evidence of thrombus. Normal compressibility, respiratory phasicity and response to augmentation. Popliteal Vein: No evidence of thrombus. Normal compressibility, respiratory phasicity and response to augmentation. Calf Veins: There is limited visualization of the LEFT posterior tibial vein and LEFT peroneal vein, without evidence of thrombus. Normal compressibility and flow on color Doppler imaging. Superficial Great Saphenous Vein: No evidence of thrombus. Normal compressibility. Venous Reflux:  None. Other Findings: A 5.6 cm x 1.4 cm x 5.1 cm complex fluid collection is seen within the soft tissues of the RIGHT  popliteal fossa. LEFT LOWER EXTREMITY Common Femoral Vein: No evidence of thrombus. Normal compressibility, respiratory phasicity and response to augmentation. Saphenofemoral Junction: No evidence of thrombus. Normal compressibility and flow on color Doppler imaging. Profunda Femoral Vein: No evidence of thrombus. Normal compressibility and flow on color Doppler imaging. Femoral Vein: No evidence of thrombus. Normal compressibility, respiratory phasicity and response to augmentation. Popliteal Vein: No evidence of thrombus. Normal compressibility, respiratory phasicity and response to augmentation. Calf Veins: The LEFT peroneal vein is poorly visualized with limited visualization of the LEFT posterior tibial vein. No evidence of thrombus within the LEFT posterior tibial vein. Normal compressibility and flow on color Doppler imaging. Superficial Great Saphenous Vein: No evidence of thrombus. Normal compressibility. Venous Reflux:  None. Other Findings: A 2.7 cm x 1.1 cm x 1.9 cm heterogeneous hypoechoic area is seen within the soft tissues of the LEFT popliteal fossa. No abnormal flow is seen within this region on color Doppler evaluation. IMPRESSION: 1. Limited evaluation of the BILATERAL calf veins, without evidence of deep venous thrombosis in either lower extremity. 2. 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 Electronically Signed   By: Suzen Dials M.D.   On: 06/11/2024 23:25    ECHO as above  TELEMETRY reviewed by me 06/18/2024: Atrial flutter versus sinus tach rate 110s  EKG reviewed by me: NSR rate 71 bpm, repeat today with atrial flutter versus sinus tach rate 112 bpm  Data reviewed by me 06/18/2024: last 24h vitals tele labs imaging I/O ED provider note, admission H&P  Principal Problem:   Acute on chronic diastolic CHF (congestive heart failure) (HCC) Active Problems:   COPD (chronic obstructive pulmonary disease) (HCC)   Myasthenia gravis (HCC)   Essential  hypertension   Paroxysmal atrial fibrillation (HCC)   Hypothyroidism   Parkinson disease (HCC)   History of stroke   Obesity (BMI 30-39.9)   Acute renal failure superimposed on stage 3a chronic kidney disease (HCC)   Leg weakness   Cellulitis of lower extremity   Myocardial injury    ASSESSMENT AND PLAN:  MALVERN KADLEC is a 78 y.o. male  with a past medical history of persistent atrial fibrillation (recent successful TEE/DCCV on 02/2024, HX ablation in 2023), hx paroxsymal SVT, chronic HFpEF (predominately right sided HF), severe TR, moderate pulmonary hypertension, hypertension, hyperlipidemia, myasthenia gravis, COPD who presented to the ED on 06/11/2024 for bilateral leg weakness, leg pain and swelling. Was treated for acute heart failure during admission. Was going to discharge to [facility] today however he developed AF RVR. Cardiology was consulted for further evaluation.   # Atrial flutter RVR  versus sinus tach # Paroxysmal atrial fibrillation (s/p DCCV 02/2024) # Acute on chronic HFpEF Patient initially presented with AoCHF, was diuresed and on the day of anticipated discharge developed AF RVR. Has hx of paroxysmal AF, followed by EP outpatient. Echo this admission with EF 60-65%, no WMAs, grade I diastolic dysfunction, mild LA dilatation, severe RA dilation, mild to moderate TR.  -Increase amiodarone  to 400 mg twice daily for 10 days followed by 200 daily.  Will also start metoprolol  tartrate 25 mg daily for rate control. -Continue eliquis  5 mg twice daily for stroke risk reduction.  -Continue spironolactone  25 mg daily, torsemide  40 mg daily. Previously prescribed farxiga  but not currently on this. -Minimal and flat troponin most consistent with demand/supply mismatch and not ACS in the setting of acute heart failure.   This patient's plan of care was discussed and created with Dr. Ammon and he is in agreement.  Signed: Danita Bloch, PA-C  06/18/2024, 11:44 AM [facility]  Cardiology

## 2024-06-18 NOTE — Assessment & Plan Note (Signed)
 Patient has borderline bradycardia for most of the stay and this morning had an episode of atrial flutter between 1 conduction and RVR. - Cardiology was consulted and patient was started on loading dose of amiodarone . - Holding Eliquis  for concern of gross hematuria started this morning

## 2024-06-18 NOTE — TOC Progression Note (Signed)
 Transition of Care [facility]) - Progression Note    Patient Details  Name: Francisco [name] MRN: [id number] Date of Birth: December 06, 1945  [facility] CM/SW Contact  Alfonso Rummer, LCSW Phone Number: 06/18/2024, 9:02 AM  Clinical Narrative:     LCSW A. Tre Sanker spk with pt at bedside. Pt advised [facility] did not extend an bed offer. Pt agrees to transition to [facility].                     Expected Discharge Plan and Services                                               Social Drivers of Health (SDOH) Interventions SDOH Screenings   Food Insecurity: No Food Insecurity (06/12/2024)  Housing: Low Risk  (06/12/2024)  Transportation Needs: No Transportation Needs (06/12/2024)  Recent Concern: Transportation Needs - Unmet Transportation Needs (05/17/2024)  Utilities: Not At Risk (06/12/2024)  Depression (PHQ2-9): Low Risk  (08/30/2021)  Financial Resource Strain: Low Risk  (04/30/2024)   Received from [facility]  Social Connections: Moderately Isolated (06/12/2024)  Tobacco Use: Medium Risk (06/12/2024)    Readmission Risk Interventions     No data to display

## 2024-06-19 DIAGNOSIS — I5033 Acute on chronic diastolic (congestive) heart failure: Secondary | ICD-10-CM | POA: Diagnosis not present

## 2024-06-19 DIAGNOSIS — Z8673 Personal history of transient ischemic attack (TIA), and cerebral infarction without residual deficits: Secondary | ICD-10-CM | POA: Diagnosis not present

## 2024-06-19 DIAGNOSIS — I5A Non-ischemic myocardial injury (non-traumatic): Secondary | ICD-10-CM | POA: Diagnosis not present

## 2024-06-19 DIAGNOSIS — I480 Paroxysmal atrial fibrillation: Secondary | ICD-10-CM | POA: Diagnosis not present

## 2024-06-19 LAB — BASIC METABOLIC PANEL WITH GFR
Anion gap: 10 (ref 5–15)
BUN: 53 mg/dL — ABNORMAL HIGH (ref 8–23)
CO2: 29 mmol/L (ref 22–32)
Calcium: 8.8 mg/dL — ABNORMAL LOW (ref 8.9–10.3)
Chloride: 97 mmol/L — ABNORMAL LOW (ref 98–111)
Creatinine, Ser: 2.38 mg/dL — ABNORMAL HIGH (ref 0.61–1.24)
GFR, Estimated: 27 mL/min — ABNORMAL LOW (ref >60)
Glucose, Bld: 92 mg/dL (ref 70–99)
Potassium: 4.1 mmol/L (ref 3.5–5.1)
Sodium: 136 mmol/L (ref 135–145)

## 2024-06-19 LAB — CBC
HCT: 36.8 % — ABNORMAL LOW (ref 39.0–52.0)
Hemoglobin: 11.8 g/dL — ABNORMAL LOW (ref 13.0–17.0)
MCH: 34 pg (ref 26.0–34.0)
MCHC: 32.1 g/dL (ref 30.0–36.0)
MCV: 106.1 fL — ABNORMAL HIGH (ref 80.0–100.0)
Platelets: 237 K/uL (ref 150–400)
RBC: 3.47 MIL/uL — ABNORMAL LOW (ref 4.22–5.81)
RDW: 13.7 % (ref 11.5–15.5)
WBC: 8.3 K/uL (ref 4.0–10.5)
nRBC: 0 % (ref 0.0–0.2)

## 2024-06-19 MED ORDER — METOPROLOL TARTRATE 5 MG/5ML IV SOLN: 5.0000 mg | Freq: Four times a day (QID) | INTRAVENOUS | Status: DC | PRN

## 2024-06-19 MED ORDER — LACTATED RINGERS IV BOLUS
500.0000 mL | Freq: Once | INTRAVENOUS | Status: AC
  Administered 2024-06-19: 500 mL via INTRAVENOUS

## 2024-06-19 MED ORDER — ENSURE PLUS HIGH PROTEIN PO LIQD
237.0000 mL | Freq: Two times a day (BID) | ORAL | Status: DC
  Administered 2024-06-19: 237 mL via ORAL

## 2024-06-19 NOTE — Progress Notes (Signed)
 Progress Note   Patient: Francisco [name] FMW:[id number] DOB: 1946/05/07 DOA: 06/11/2024     8 DOS: the patient was seen and examined on 06/19/2024   Brief hospital course: Partly taken from H&P.  Francisco [name] is a 78 y.o. male with medical history significant of A-fib on Eliquis , Parkinson's disease, HTN,  HLD, COPD, PVD, dCHD, stroke, hypothyroidism, bilateral carotid artery stenosis (s/p of stent of right carotid artery), myasthenia gravis, Merkel cell carcinoma, who presents with bilateral leg weakness, leg pain and swelling.   He was also having left leg numbness for about 2 months.  On presentation stable vitals with heart rate ranging between 48-60, labs with BNP of 1037, INR 1.6, CXR with elevation of right hemidiaphragm. CT head was negative for any acute intracranial abnormality.  Lower extremity venous Doppler negative for DVT but did show Baker's cyst  Patient was admitted for acute on chronic HFpEF.  Started on IV Lasix .  Also started on ceftriaxone  for concern of lower extremity cellulitis  10/1: Vital stable, troponin 35>33>25, creatinine with some improvement to 2.25, preliminary blood cultures negative.  Mildly elevated CRP at 2.2 and elevated ESR at 70.  PT is recommending SNF.  10/2: Hemodynamically stable, creatinine improving now at 2.01.  Improving lower extremity cellulitis so antibiotics switched with cefadroxil. Continuing IV Lasix  and TOC is looking for placement.  10/3: Remained hemodynamically stable, renal function continued to improve, will continue with IV Lasix  while TOC is working on placement.  10/4: Hemodynamically stable, renal function continue to improve so continuing IV diuresis.  Medically stable to go to rehab.  10/5: Remained hemodynamically stable and kidney functions continue to improve now creatinine at 1.41.  Awaiting SNF placement  10/6: Hemodynamically stable, small increase in creatinine, switching him from IV Lasix  to torsemide .   Still pending disposition.  10/7: Obtained insurance authorization for rehab but patient developed a flutter between 1 conduction with RVR, cardiology was consulted and he was given loading dose of amiodarone .  Later also developed gross hematuria so holding Eliquis  for now and monitoring CBC.  10/8: Vitals with soft blood pressure at 85/41 this morning requiring 500 cc bolus, blood pressure improved.  Worsening renal function with creatinine at 2.38 from 1.53 today.  Holding diuretic. No further hematuria, hemoglobin at 11.8.  Assessment and Plan: * Acute on chronic diastolic CHF (congestive heart failure) (HCC) Patient admitted with lower extremity edema and elevated BNP at 1037 consistent with CHF exacerbation.  Prior echo done in June 2025 with normal EF Creatinine continue to improve -Switching IV Lasix  with torsemide -holding today due to concern of AKI - Continue with spironolactone  -Daily weight and BMP -Strict intake and output  Myocardial injury Mildly elevated troponin at 35 with a downward trend on subsequent check.  No chest pain.  Likely demand ischemia with CHF exacerbation.  Gross hematuria Patient had an episode of gross hematuria with a large clot in it.  No further episode -Hemoglobin at 11.8 -Will resume Eliquis  from tomorrow -Continue to monitor  Paroxysmal atrial fibrillation (HCC) Converted back to sinus rhythm and again having borderline bradycardia - Cardiology was consulted and patient was started on loading dose of amiodarone . -Amiodarone  was discontinued today due to borderline bradycardia again -IV metoprolol  as needed - Eliquis  was held for 1 day for concern of gross hematuria, we will restart tomorrow if there is no more episode  History of stroke - On Eliquis  due to A-fib  Essential hypertension Blood pressure initially soft requiring a bolus -  Holding torsemide  and spironolactone   Myasthenia gravis (HCC) -Continue home prednisone  10 mg on  Monday Wednesday Friday -  received Solu-Cortef 100 mg as a stress dose - Continue home azathioprine  50 mg daily  COPD (chronic obstructive pulmonary disease) (HCC) No acute concern. - Continue with as needed bronchodilators  Hypothyroidism - Continue with home Synthroid   Cellulitis of lower extremity Concern of bilateral lower extremity cellulitis with edema and erythema.  No leukocytosis, elevated CRP and ESR.  Preliminary blood cultures negative - Switching ceftriaxone  with cefadroxil to complete a 5-day course.  Acute renal failure superimposed on stage 3a chronic kidney disease (HCC) Most recent creatinine of 1.68 on 05/22/2024. It was 2.50 on admission, Likely cardiorenal with concern of CHF exacerbation.  Creatinine continue to improve with diuresis, now at 1.41 - Monitor renal function while he is being diuresed -Avoid nephrotoxin  Parkinson disease (HCC) - Continue Sinemet   Leg weakness Patient has worsening bilateral leg weakness for more than 2 months, recently came home from rehab.  MRI brain with no acute intracranial abnormality.  Some progression of chronic small vessel disease. - PT is recommending SNF -TOC consult  Obesity (BMI 30-39.9) Estimated body mass index is 31.79 kg/m as calculated from the following:   Height as of this encounter: 5' 10 (1.778 m).   Weight as of this encounter: 100.5 kg.   Patient with class I obesity. - Encouraged weight loss with exercise and healthy diet  Subjective: Patient was sitting in chair when seen today.  Feeling little disappointed and frustrated that why he all of a sudden got worse after initial improvement.  Denies any pain or shortness of breath.  Physical Exam: Vitals:   06/19/24 0434 06/19/24 0700 06/19/24 0900 06/19/24 1255  BP: (!) 120/55  (!) 85/41 (!) 140/60  Pulse: (!) 40  (!) 58 (!) 54  Resp: 17  16 16   Temp: 97.9 F (36.6 C)  98.4 F (36.9 C) 97.8 F (36.6 C)  TempSrc:   Oral Oral  SpO2: 94%  98% 98%   Weight:  96.5 kg    Height:       General.  Frail elderly man, in no acute distress. Pulmonary.  Lungs clear bilaterally, normal respiratory effort. CV.  Regular rate and rhythm, borderline bradycardia no JVD, rub or murmur. Abdomen.  Soft, nontender, nondistended, BS positive. CNS.  Alert and oriented .  No focal neurologic deficit. Extremities.  No edema, no cyanosis, pulses intact and symmetrical. Psychiatry.  Judgment and insight appears normal.      Data Reviewed: Prior data reviewed  Family Communication: Called daughter with no response, left a voicemail.  Disposition: Status is: Inpatient Remains inpatient appropriate because: Severity of illness  Planned Discharge Destination: Skilled nursing facility  DVT prophylaxis.  Eliquis  Time spent: 50 minutes  This record has been created using Conservation officer, historic buildings. Errors have been sought and corrected,but may not always be located. Such creation errors do not reflect on the standard of care.   Author: Amaryllis Dare, MD 06/19/2024 3:26 PM  For on call review [email or web address].

## 2024-06-19 NOTE — Plan of Care (Signed)
  Problem: Education: Goal: Ability to demonstrate management of disease process will improve Outcome: Progressing Goal: Ability to verbalize understanding of medication therapies will improve Outcome: Progressing Goal: Individualized Educational Video(s) Outcome: Progressing   Problem: Activity: Goal: Capacity to carry out activities will improve Outcome: Progressing   Problem: Cardiac: Goal: Ability to achieve and maintain adequate cardiopulmonary perfusion will improve Outcome: Progressing   Problem: Clinical Measurements: Goal: Ability to avoid or minimize complications of infection will improve Outcome: Progressing   Problem: Skin Integrity: Goal: Skin integrity will improve Outcome: Progressing   Problem: Education: Goal: Knowledge of General Education information will improve Description: Including pain rating scale, medication(s)/side effects and non-pharmacologic comfort measures Outcome: Progressing   Problem: Health Behavior/Discharge Planning: Goal: Ability to manage health-related needs will improve Outcome: Progressing   Problem: Clinical Measurements: Goal: Ability to maintain clinical measurements within normal limits will improve Outcome: Progressing Goal: Will remain free from infection Outcome: Progressing Goal: Diagnostic test results will improve Outcome: Progressing Goal: Respiratory complications will improve Outcome: Progressing Goal: Cardiovascular complication will be avoided Outcome: Progressing   Problem: Activity: Goal: Risk for activity intolerance will decrease Outcome: Progressing   Problem: Nutrition: Goal: Adequate nutrition will be maintained Outcome: Progressing   Problem: Coping: Goal: Level of anxiety will decrease Outcome: Progressing   Problem: Elimination: Goal: Will not experience complications related to bowel motility Outcome: Progressing Goal: Will not experience complications related to urinary  retention Outcome: Progressing   Problem: Pain Managment: Goal: General experience of comfort will improve and/or be controlled Outcome: Progressing   Problem: Safety: Goal: Ability to remain free from injury will improve Outcome: Progressing   Problem: Skin Integrity: Goal: Risk for impaired skin integrity will decrease Outcome: Progressing

## 2024-06-19 NOTE — Progress Notes (Signed)
 Occupational Therapy Treatment Patient Details Name: Francisco [name] MRN: [id number] DOB: Aug 14, 1946 Today's Date: 06/19/2024   History of present illness Pt is a 78 year old male presents with leg weakness, leg pain and swelling, admitted with Acute on chronic diastolic CHF, Possible cellulitis of lower extremity, Acute renal failure superimposed on stage 3a chronic kidney disease. PMH significant for A-fib on Eliquis , Parkinson's disease, HTN,  HLD, COPD, PVD, dCHD, stroke, hypothyroidism, bilateral carotid artery stenosis (s/p of stent of right carotid artery), myasthenia gravis, Merkel cell carcinoma   OT comments  Chart reviewed to date, pt greeted semi supine in bed, agreeable to OT tx session targeting improving functional activity tolerance in prep for ADL tasks. Pt continues to present with generalized weakness, especially LLE weakness including knee buckling in standing, impacting safe and optimal ADL/functional mobility completion. He will continue to benefit from skilled OT to address functional deficits and to facilitate optimal ADL/functional mobility performance. OT will continue to follow.       If plan is discharge home, recommend the following:  Help with stairs or ramp for entrance;Assistance with cooking/housework;Assist for transportation;Two people to help with walking and/or transfers;A lot of help with bathing/dressing/bathroom   Equipment Recommendations    Defer to next venue of care   Recommendations for Other Services      Precautions / Restrictions Precautions Precautions: Fall Recall of Precautions/Restrictions: Intact Restrictions Weight Bearing Restrictions Per Provider Order: No       Mobility Bed Mobility Overal bed mobility: Needs Assistance Bed Mobility: Supine to Sit, Sit to Supine     Supine to sit: Min assist, Mod assist, HOB elevated Sit to supine: Mod assist, Used rails        Transfers Overall transfer level: Needs  assistance Equipment used: Rolling walker (2 wheels) Transfers: Sit to/from Stand Sit to Stand: Max assist (and blocking L knee)           General transfer comment: lateral scoot up the bed with MIN A to the L 2x     Balance Overall balance assessment: Needs assistance Sitting-balance support: Feet supported Sitting balance-Leahy Scale: Good Sitting balance - Comments: sitting for approx 8 minutes                                   ADL either performed or assessed with clinical judgement   ADL Overall ADL's : Needs assistance/impaired     Grooming: Oral care;Sitting;Set up               Lower Body Dressing: Maximal assistance                      Extremity/Trunk Assessment              Vision       Perception     Praxis     Communication Communication Communication: No apparent difficulties   Cognition Arousal: Alert Behavior During Therapy: WFL for tasks assessed/performed Cognition: No apparent impairments                               Following commands: Intact        Cueing   Cueing Techniques: Verbal cues  Exercises Other Exercises Other Exercises: edu re role of OT, role of rehab, discharge recommendation    Shoulder Instructions  General Comments vss    Pertinent Vitals/ Pain       Pain Assessment Pain Assessment: Faces Faces Pain Scale: Hurts a little bit Pain Location: lower back Pain Descriptors / Indicators: Discomfort Pain Intervention(s): Monitored during session, Repositioned  Home Living                                          Prior Functioning/Environment              Frequency  Min 2X/week        Progress Toward Goals  OT Goals(current goals can now be found in the care plan section)  Progress towards OT goals: Progressing toward goals  Acute Rehab OT Goals Time For Goal Achievement: 06/26/24  Plan      Co-evaluation                  AM-PAC OT 6 Clicks Daily Activity     Outcome Measure   Help from another person eating meals?: None Help from another person taking care of personal grooming?: None Help from another person toileting, which includes using toliet, bedpan, or urinal?: A Lot Help from another person bathing (including washing, rinsing, drying)?: A Lot Help from another person to put on and taking off regular upper body clothing?: A Little Help from another person to put on and taking off regular lower body clothing?: A Lot 6 Click Score: 17    End of Session Equipment Utilized During Treatment: Rolling walker (2 wheels)  OT Visit Diagnosis: Unsteadiness on feet (R26.81);Other abnormalities of gait and mobility (R26.89);Repeated falls (R29.6);Muscle weakness (generalized) (M62.81);Pain   Activity Tolerance Patient tolerated treatment well   Patient Left in bed;with call bell/phone within reach;with bed alarm set   Nurse Communication Mobility status        Time: 8689-8673 OT Time Calculation (min): 16 min  Charges: OT General Charges $OT Visit: 1 Visit OT Treatments $Therapeutic Activity: 8-22 mins  Therisa Sheffield, OTD OTR/L  06/19/24, 2:54 PM

## 2024-06-19 NOTE — Progress Notes (Signed)
 [facility] CLINIC CARDIOLOGY PROGRESS NOTE       Patient ID: Francisco [name] MRN: [id number] DOB/AGE: May 03, 1946 78 y.o.  Admit date: 06/11/2024 Referring Physician Dr. Amaryllis Dare Primary Physician Fernande Ophelia JINNY DOUGLAS, MD  Primary Cardiologist Dr. Wilburn Reason for Consultation AF RVR  HPI: Francisco [name] is a 78 y.o. male  with a past medical history of persistent atrial fibrillation (recent successful TEE/DCCV on 06/12, HX ablation in 2023), hx paroxsymal SVT, chronic HFpEF (predominately right sided HF), severe TR, moderate pulmonary hypertension, hypertension, hyperlipidemia, myasthenia gravis, COPD who presented to the ED on 06/11/2024 for bilateral leg weakness, leg pain and swelling. Was treated for acute heart failure during admission. Was going to discharge to [facility] today however he developed AF RVR. Cardiology was consulted for further evaluation.   Interval history: -Patient seen and examined this AM, sitting in bedside chair.  -Denies any cardiac complaints, reports some discomfort in his back from sitting. -Back in NSR with HR 60s, low overnight in the 40s.  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   Carotid stenosis    CHF (congestive heart failure) (HCC)    CKD (chronic kidney disease)    CKD (chronic kidney disease) stage 3, GFR 30-59 ml/min (HCC)    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   DDD (degenerative disc disease), cervical    Dysrhythmia    post carotid stent bradycardia; PAF 09/2020   GERD (gastroesophageal reflux disease)    Hypercholesterolemia    Hypertension    Hypothyroidism    pt  takes Levothyroxine  daily   Lumbosacral spinal stenosis    Myasthenia gravis, adult form (HCC)    PAD (peripheral artery disease)    Parkinson disease (HCC)    Shortness of breath    Lung MD- Dr Burnett Servant   Sleep apnea    do not use CPAP every night   Stroke (HCC) 05/31/2023    Past Surgical History:  Procedure Laterality Date   ANTERIOR CERVICAL DECOMP/DISCECTOMY FUSION  07/18/2011   Procedure: ANTERIOR CERVICAL DECOMPRESSION/DISCECTOMY FUSION 2 LEVELS;  Surgeon: Victory LABOR Pool;  Location: [facility];  Service: Neurosurgery;  Laterality: N/A;  cervical five-six, cervical six-seven anterior cervical discectomy and fusion   ATRIAL FIBRILLATION ABLATION     BACK SURGERY     in 1985 [facility]   BILATERAL CARPAL TUNNEL RELEASE     01/2020 Right, 04/2020 Left   CARDIAC CATHETERIZATION     2005 at [facility], no stents   CARDIOVERSION N/A 02/22/2024   Procedure: CARDIOVERSION;  Surgeon: Alluri, Keller BROCKS, MD;  Location: [facility] ORS;  Service: Cardiovascular;  Laterality: N/A;   CAROTID PTA/STENT INTERVENTION N/A 09/17/2020   Procedure: CAROTID PTA/STENT INTERVENTION;  Surgeon: Marea Selinda RAMAN, MD;  Location: [facility] INVASIVE CV LAB;  Service: Cardiovascular;  Laterality: N/A;   CATARACT EXTRACTION W/PHACO Left 01/06/2020   Procedure: CATARACT EXTRACTION PHACO AND INTRAOCULAR LENS PLACEMENT (IOC) ISTENT INJ LEFT 3.81  00:33.3;  Surgeon: Myrna Adine Anes, MD;  Location: [facility];  Service: Ophthalmology;  Laterality: Left;   CATARACT EXTRACTION W/PHACO Right 02/03/2020   Procedure: CATARACT EXTRACTION PHACO AND INTRAOCULAR LENS PLACEMENT (IOC) RIGHT ISTENT INJ;  Surgeon: Myrna Adine Anes, MD;  Location: [facility];  Service: Ophthalmology;  Laterality: Right;  4.29 0:35.6   COLONOSCOPY     HERNIA REPAIR Left    inguinal hernia repair in 1985   LUMBAR LAMINECTOMY/DECOMPRESSION MICRODISCECTOMY Left 02/24/2014   Procedure: LUMBAR LAMINECTOMY/DECOMPRESSION MICRODISCECTOMY LUMBAR  THREE-FOUR, FOUR-FIVE, LEFT FIVE-SACRAL ONE ;  Surgeon: Victory DELENA Gunnels, MD;  Location: [facility];  Service: Neurosurgery;  Laterality: Left;  LUMBAR LAMINECTOMY/DECOMPRESSION MICRODISCECTOMY LUMBAR THREE-FOUR, FOUR-FIVE, LEFT FIVE-SACRAL ONE    LUMBAR LAMINECTOMY/DECOMPRESSION MICRODISCECTOMY N/A 05/03/2021   Procedure: Laminectomy and Foraminotomy - L2-L3;  Surgeon: Gunnels Victory, MD;  Location: [facility];  Service: Neurosurgery;  Laterality: N/A;  3C   POSTERIOR CERVICAL FUSION/FORAMINOTOMY N/A 08/07/2020   Procedure: C3-6 POSTERIOR FUSION WITH DECOMPRESSION;  Surgeon: Clois Fret, MD;  Location: [facility] ORS;  Service: Neurosurgery;  Laterality: N/A;   PROSTATECTOMY  04/2013   [facility] Dr ozell Geralds    TEE WITHOUT CARDIOVERSION N/A 02/22/2024   Procedure: ECHOCARDIOGRAM, TRANSESOPHAGEAL;  Surgeon: Alluri, Keller BROCKS, MD;  Location: [facility] ORS;  Service: Cardiovascular;  Laterality: N/A;    Medications Prior to Admission  Medication Sig Dispense Refill Last Dose/Taking   acetaminophen  (TYLENOL ) 325 MG tablet Take 2 tablets (650 mg total) by mouth every 6 (six) hours as needed for mild pain (pain score 1-3) or fever (or Fever >/= 101).   Past Month   apixaban  (ELIQUIS ) 5 MG TABS tablet Take 5 mg by mouth 2 (two) times daily.   06/12/2024 Morning   azaTHIOprine  (IMURAN ) 50 MG tablet Take 150 mg by mouth daily.    06/12/2024 Morning   carbidopa -levodopa  (SINEMET  IR) 25-100 MG tablet Take 1 tablet by mouth 3 (three) times daily.   06/12/2024 Morning   Cholecalciferol  (D3-1000) 25 MCG (1000 UT) capsule Take 2,000 Units by mouth daily.   06/12/2024 Morning   cyanocobalamin  (VITAMIN B12) 1000 MCG tablet Take 1,000 mcg by mouth daily.   06/12/2024 Morning   HYDROcodone -acetaminophen  (NORCO) 10-325 MG tablet Take 1-2 tablets by mouth every 6 (six) hours as needed for moderate pain (pain score 4-6) or severe pain (pain score 7-10). 10 tablet 0 06/12/2024 Morning   levothyroxine  (SYNTHROID ) 50 MCG tablet Take 1 tablet  (50 mcg total) by mouth daily at 6 (six) AM. 30 tablet 0 06/12/2024 Morning   meclizine  (ANTIVERT ) 25 MG tablet Take 1 tablet by mouth 3 (three) times daily as needed for dizziness.   06/12/2024 Morning   polyethylene glycol (MIRALAX  / GLYCOLAX ) 17 g packet Take 17 g by mouth daily. Skip the dose if no constipation   Past Week   predniSONE  (DELTASONE ) 10 MG tablet Take 10 mg by mouth every Monday, Wednesday, and Friday.   06/11/2024   spironolactone  (ALDACTONE ) 25 MG tablet Take 1 tablet (25 mg total) by mouth daily.   06/12/2024 Morning   amiodarone  (PACERONE ) 200 MG tablet Take 1 tablet (200 mg total) by mouth daily. (Patient not taking: Reported on 06/12/2024)   Not Taking   bumetanide  (BUMEX ) 2 MG tablet Take 2 mg by mouth daily. (Patient not taking: Reported on 06/12/2024)   Not Taking   dapagliflozin  propanediol (FARXIGA ) 10 MG TABS tablet Take 1 tablet (10 mg total) by mouth daily. (Patient not taking: Reported on 06/12/2024)   Not Taking   pregabalin  (LYRICA ) 50 MG capsule Take 50 mg by mouth  2 (two) times daily. (Patient not taking: Reported on 06/12/2024)   Not Taking   Social History   Socioeconomic History   Marital status: Widowed    Spouse name: [name]   Number of children: Not on file   Years of education: Not on file   Highest education level: Not on file  Occupational History   Not on file  Tobacco Use   Smoking status: Former    Current packs/day: 0.00    Average packs/day: 1 pack/day for 20.0 years (20.0 ttl pk-yrs)    Types: Cigarettes    Start date: 09/12/1981    Quit date: 09/12/2001    Years since quitting: 22.7   Smokeless tobacco: Never  Vaping Use   Vaping status: Never Used  Substance and Sexual Activity   Alcohol use: Yes    Alcohol/week: 3.0 standard drinks of alcohol    Types: 3 Glasses of wine per week    Comment: 3 glasses a wine a week   Drug use: No   Sexual activity: Yes  Other Topics Concern   Not on file  Social History Narrative   Not on file    Social Drivers of Health   Financial Resource Strain: Low Risk  (04/30/2024)   Received from [facility]   Overall Financial Resource Strain (CARDIA)    Difficulty of Paying Living Expenses: Not hard at all  Food Insecurity: No Food Insecurity (06/12/2024)   Hunger Vital Sign    Worried About Running Out of Food in the Last Year: Never true    Ran Out of Food in the Last Year: Never true  Transportation Needs: No Transportation Needs (06/12/2024)   PRAPARE - Administrator, Civil Service (Medical): No    Lack of Transportation (Non-Medical): No  Recent Concern: Transportation Needs - Unmet Transportation Needs (05/17/2024)   PRAPARE - Administrator, Civil Service (Medical): Yes    Lack of Transportation (Non-Medical): Yes  Physical Activity: Not on file  Stress: Not on file  Social Connections: Moderately Isolated (06/12/2024)   Social Connection and Isolation Panel    Frequency of Communication with Friends and Family: Once a week    Frequency of Social Gatherings with Friends and Family: Once a week    Attends Religious Services: More than 4 times per year    Active Member of [employer] or Organizations: Yes    Attends Banker Meetings: More than 4 times per year    Marital Status: Widowed  Intimate Partner Violence: Not At Risk (06/12/2024)   Humiliation, Afraid, Rape, and Kick questionnaire    Fear of Current or Ex-Partner: No    Emotionally Abused: No    Physically Abused: No    Sexually Abused: No    Family History  Problem Relation Age of Onset   Hypertension Mother    Stroke Mother    Stroke Father      Vitals:   06/18/24 2019 06/19/24 0328 06/19/24 0434 06/19/24 0700  BP: (!) 119/56 (!) 116/49 (!) 120/55   Pulse: (!) 42 (!) 39 (!) 40   Resp: 18 16 17    Temp: 97.6 F (36.4 C) 98.4 F (36.9 C) 97.9 F (36.6 C)   TempSrc:      SpO2: 97% 93% 94%   Weight:    96.5 kg  Height:        PHYSICAL EXAM General:  Chronically ill-appearing elderly male, well nourished, in no acute distress. HEENT: Normocephalic and  atraumatic. Neck: No JVD.  Lungs: Normal respiratory effort on room air. Clear bilaterally to auscultation. No wheezes, crackles, rhonchi.  Heart: HRRR. Normal S1 and S2 without gallops or murmurs.  Abdomen: Non-distended appearing.  Msk: Normal strength and tone for age. Extremities: Warm and well perfused. No clubbing, cyanosis.  No edema.  Neuro: Alert and oriented X 3. Psych: Answers questions appropriately.   Labs: Basic Metabolic Panel: Recent Labs    06/17/24 0421 06/19/24 0423  NA 141 136  K 4.1 4.1  CL 98 97*  CO2 31 29  GLUCOSE 105* 92  BUN 35* 53*  CREATININE 1.53* 2.38*  CALCIUM  9.2 8.8*  MG 2.5*  --    Liver Function Tests: No results for input(s): AST, ALT, ALKPHOS, BILITOT, PROT, ALBUMIN  in the last 72 hours. No results for input(s): LIPASE, AMYLASE in the last 72 hours. CBC: Recent Labs    06/18/24 1359 06/19/24 0423  WBC 8.8 8.3  HGB 12.7* 11.8*  HCT 38.9* 36.8*  MCV 104.3* 106.1*  PLT 243 237   Cardiac Enzymes: No results for input(s): CKTOTAL, CKMB, CKMBINDEX, TROPONINIHS in the last 72 hours. BNP: No results for input(s): BNP in the last 72 hours. D-Dimer: No results for input(s): DDIMER in the last 72 hours. Hemoglobin A1C: No results for input(s): HGBA1C in the last 72 hours. Fasting Lipid Panel: No results for input(s): CHOL, HDL, LDLCALC, TRIG, CHOLHDL, LDLDIRECT in the last 72 hours. Thyroid  Function Tests: No results for input(s): TSH, T4TOTAL, T3FREE, THYROIDAB in the last 72 hours.  Invalid input(s): FREET3 Anemia Panel: No results for input(s): VITAMINB12, FOLATE, FERRITIN, TIBC, IRON , RETICCTPCT in the last 72 hours.   Radiology: ECHOCARDIOGRAM COMPLETE Result Date: 06/14/2024    ECHOCARDIOGRAM REPORT   Patient Name:   TENG DECOU Date of Exam: 06/13/2024  Medical Rec #:  [id number]          Height:       70.0 in Accession #:    [id number]         Weight:       207.0 lb Date of Birth:  29-Sep-1945          BSA:          2.118 m Patient Age:    37 years           BP:           117/56 mmHg Patient Gender: M                  HR:           55 bpm. Exam Location:  [facility] Procedure: 2D Echo, Color Doppler and Cardiac Doppler (Both Spectral and Color            Flow Doppler were utilized during procedure). Indications:     Abnormal ECG R94.31  History:         Patient has prior history of Echocardiogram examinations, most                  recent 06/01/2023. Abnormal ECG.  Sonographer:     Ashley McNeely-Sloane Referring Phys:  [id number] SUMAYYA AMIN Diagnosing Phys: Sabina Custovic IMPRESSIONS  1. Left ventricular ejection fraction, by estimation, is 60 to 65%. The left ventricle has normal function. The left ventricle has no regional wall motion abnormalities. Left ventricular diastolic parameters are consistent with Grade I diastolic dysfunction (impaired relaxation).  2. Right ventricular systolic function is low normal. The right ventricular size is  moderately enlarged. There is normal pulmonary artery systolic pressure. The estimated right ventricular systolic pressure is 33.6 mmHg.  3. Left atrial size was mildly dilated.  4. Right atrial size was severely dilated.  5. The mitral valve is normal in structure. No evidence of mitral valve regurgitation. No evidence of mitral stenosis.  6. Tricuspid valve regurgitation is mild to moderate.  7. The aortic valve is normal in structure. Aortic valve regurgitation is not visualized. No aortic stenosis is present.  8. The inferior vena cava is normal in size with greater than 50% respiratory variability, suggesting right atrial pressure of 3 mmHg. FINDINGS  Left Ventricle: Left ventricular ejection fraction, by estimation, is 60 to 65%. The left ventricle has normal function. The left ventricle has no regional wall motion  abnormalities. The left ventricular internal cavity size was normal in size. There is  no left ventricular hypertrophy. Left ventricular diastolic parameters are consistent with Grade I diastolic dysfunction (impaired relaxation). Right Ventricle: The right ventricular size is moderately enlarged. No increase in right ventricular wall thickness. Right ventricular systolic function is low normal. There is normal pulmonary artery systolic pressure. The tricuspid regurgitant velocity  is 2.53 m/s, and with an assumed right atrial pressure of 8 mmHg, the estimated right ventricular systolic pressure is 33.6 mmHg. Left Atrium: Left atrial size was mildly dilated. Right Atrium: Right atrial size was severely dilated. Pericardium: There is no evidence of pericardial effusion. Mitral Valve: The mitral valve is normal in structure. No evidence of mitral valve regurgitation. No evidence of mitral valve stenosis. MV peak gradient, 4.0 mmHg. The mean mitral valve gradient is 1.0 mmHg. Tricuspid Valve: The tricuspid valve is normal in structure. Tricuspid valve regurgitation is mild to moderate. Aortic Valve: The aortic valve is normal in structure. Aortic valve regurgitation is not visualized. No aortic stenosis is present. Aortic valve mean gradient measures 4.0 mmHg. Aortic valve peak gradient measures 7.8 mmHg. Aortic valve area, by VTI measures 2.22 cm. Pulmonic Valve: The pulmonic valve was normal in structure. Pulmonic valve regurgitation is not visualized. Aorta: The aortic root is normal in size and structure. Venous: The inferior vena cava is normal in size with greater than 50% respiratory variability, suggesting right atrial pressure of 3 mmHg. IAS/Shunts: No atrial level shunt detected by color flow Doppler.  LEFT VENTRICLE PLAX 2D LVIDd:         4.20 cm     Diastology LVIDs:         2.20 cm     LV e' medial:    15.00 cm/s LV PW:         1.50 cm     LV E/e' medial:  6.3 LV IVS:        1.20 cm     LV e' lateral:    11.40 cm/s LVOT diam:     2.00 cm     LV E/e' lateral: 8.3 LV SV:         63 LV SV Index:   30 LVOT Area:     3.14 cm  LV Volumes (MOD) LV vol d, MOD A2C: 33.6 ml LV vol d, MOD A4C: 37.0 ml LV vol s, MOD A2C: 17.1 ml LV vol s, MOD A4C: 14.4 ml LV SV MOD A2C:     16.5 ml LV SV MOD A4C:     37.0 ml LV SV MOD BP:      22.0 ml RIGHT VENTRICLE RV Basal diam:  5.50 cm RV Mid diam:  4.30 cm RV S prime:     11.90 cm/s TAPSE (M-mode): 1.8 cm LEFT ATRIUM           Index        RIGHT ATRIUM           Index LA diam:      4.30 cm 2.03 cm/m   RA Area:     27.30 cm LA Vol (A4C): 47.4 ml 22.38 ml/m  RA Volume:   92.90 ml  43.86 ml/m  AORTIC VALVE                    PULMONIC VALVE AV Area (Vmax):    2.22 cm     PV Vmax:        0.68 m/s AV Area (Vmean):   2.03 cm     PV Vmean:       44.900 cm/s AV Area (VTI):     2.22 cm     PV VTI:         0.108 m AV Vmax:           140.00 cm/s  PV Peak grad:   1.9 mmHg AV Vmean:          92.400 cm/s  PV Mean grad:   1.0 mmHg AV VTI:            0.285 m      RVOT Peak grad: 2 mmHg AV Peak Grad:      7.8 mmHg AV Mean Grad:      4.0 mmHg LVOT Vmax:         99.00 cm/s LVOT Vmean:        59.700 cm/s LVOT VTI:          0.201 m LVOT/AV VTI ratio: 0.71  AORTA Ao Root diam: 3.50 cm Ao Asc diam:  3.00 cm MITRAL VALVE               TRICUSPID VALVE MV Area (PHT): 2.80 cm    TR Peak grad:   25.6 mmHg MV Area VTI:   1.91 cm    TR Mean grad:   19.0 mmHg MV Peak grad:  4.0 mmHg    TR Vmax:        253.00 cm/s MV Mean grad:  1.0 mmHg    TR Vmean:       209.0 cm/s MV Vmax:       1.00 m/s MV Vmean:      50.3 cm/s   SHUNTS MV Decel Time: 271 msec    Systemic VTI:  0.20 m MV E velocity: 94.30 cm/s  Systemic Diam: 2.00 cm                            Pulmonic VTI:  0.117 m Annalee Custovic Electronically signed by Annalee Casa Signature Date/Time: 06/14/2024/8:41:18 AM    Final    MR BRAIN WO CONTRAST Result Date: 06/12/2024 CLINICAL DATA:  78 year old male with neurologic deficit, increasing lower extremity  weakness. Paroxysmal atrial fibrillation. EXAM: MRI HEAD WITHOUT CONTRAST TECHNIQUE: Multiplanar, multiecho pulse sequences of the brain and surrounding structures were obtained without intravenous contrast. COMPARISON:  Head CT yesterday.  Brain MRI 05/29/2023, and earlier. FINDINGS: Brain: Stable cerebral volume since last year. No restricted diffusion to suggest acute infarction. No midline shift, mass effect, evidence of mass lesion, ventriculomegaly, extra-axial collection or acute intracranial hemorrhage. Cervicomedullary junction and pituitary are within normal limits. Redemonstrated chronic  small area of cortical encephalomalacia right middle frontal gyrus (series 9, image 39). Occasional chronic microhemorrhage in both cerebral hemispheres (minimal for age). Patchy bilateral periventricular white matter T2 and FLAIR hyperintensity which has progressed since a 2021 MRI, stable from last year and moderate for age. Mild to moderate chronic T2 heterogeneity also in the pons, also progressed since 2021. No other cortical encephalomalacia identified. Stable mild for age T2 heterogeneity in the deep gray nuclei. Cerebellum appears negative. Vascular: Major intracranial vascular flow voids are stable. Mild generalized intracranial artery tortuosity. Skull and upper cervical spine: Chronic cervical spine fusion, partially visible hardware artifact. Background bone marrow signal appears normal, degenerative ligamentous hypertrophy about the odontoid process does not appear significantly changed. Sinuses/Orbits: Stable, negative. Other: Mastoids well aerated.  Negative visible scalp and face. IMPRESSION: 1. No acute intracranial abnormality. 2. Chronic small and medium-sized vessel ischemia, overall moderate for age. Chronic small vessel changes do appear progressed in the cerebral white matter and pons since a 2021 MRI, but stable brain compared to MRI last year. 3. Chronic cervical spine operative fusion.  Electronically Signed   By: VEAR Hurst M.D.   On: 06/12/2024 04:28   DG Chest Port 1 View Result Date: 06/11/2024 CLINICAL DATA:  Edema. EXAM: PORTABLE CHEST 1 VIEW COMPARISON:  03/27/2024, CT 05/17/2024 FINDINGS: Chronic elevation of right hemidiaphragm with adjacent bandlike scarring. Mild cardiomegaly stable. No pulmonary edema. No significant pleural effusion. No acute airspace disease. No pneumothorax. IMPRESSION: 1. No acute chest findings. 2. Chronic elevation of right hemidiaphragm with adjacent scarring. Electronically Signed   By: Andrea Gasman M.D.   On: 06/11/2024 23:38   CT Head Wo Contrast Result Date: 06/11/2024 CLINICAL DATA:  Increased lower extremity weakness. EXAM: CT HEAD WITHOUT CONTRAST TECHNIQUE: Contiguous axial images were obtained from the base of the skull through the vertex without intravenous contrast. RADIATION DOSE REDUCTION: This exam was performed according to the departmental dose-optimization program which includes automated exposure control, adjustment of the mA and/or kV according to patient size and/or use of iterative reconstruction technique. COMPARISON:  May 17, 2024 FINDINGS: Brain: No evidence of acute infarction, hemorrhage, hydrocephalus, extra-axial collection or mass lesion/mass effect. Vascular: Moderate to marked severity bilateral cavernous carotid artery calcification is noted. Skull: Normal. Negative for fracture or focal lesion. Sinuses/Orbits: No acute finding. Other: None. IMPRESSION: No acute intracranial abnormality. Electronically Signed   By: Suzen Dials M.D.   On: 06/11/2024 23:27   US  Venous Img Lower Bilateral Result Date: 06/11/2024 CLINICAL DATA:  Bilateral leg pain. EXAM: BILATERAL LOWER EXTREMITY VENOUS DOPPLER ULTRASOUND TECHNIQUE: Gray-scale sonography with graded compression, as well as color Doppler and duplex ultrasound were performed to evaluate the lower extremity deep venous systems from the level of the common femoral vein  and including the common femoral, femoral, profunda femoral, popliteal and calf veins including the posterior tibial, peroneal and gastrocnemius veins when visible. The superficial great saphenous vein was also interrogated. Spectral Doppler was utilized to evaluate flow at rest and with distal augmentation maneuvers in the common femoral, femoral and popliteal veins. COMPARISON:  May 17, 2024 FINDINGS: RIGHT LOWER EXTREMITY Common Femoral Vein: No evidence of thrombus. Normal compressibility, respiratory phasicity and response to augmentation. Saphenofemoral Junction: No evidence of thrombus. Normal compressibility and flow on color Doppler imaging. Profunda Femoral Vein: No evidence of thrombus. Normal compressibility and flow on color Doppler imaging. Femoral Vein: No evidence of thrombus. Normal compressibility, respiratory phasicity and response to augmentation. Popliteal Vein: No evidence of thrombus. Normal  compressibility, respiratory phasicity and response to augmentation. Calf Veins: There is limited visualization of the LEFT posterior tibial vein and LEFT peroneal vein, without evidence of thrombus. Normal compressibility and flow on color Doppler imaging. Superficial Great Saphenous Vein: No evidence of thrombus. Normal compressibility. Venous Reflux:  None. Other Findings: A 5.6 cm x 1.4 cm x 5.1 cm complex fluid collection is seen within the soft tissues of the RIGHT popliteal fossa. LEFT LOWER EXTREMITY Common Femoral Vein: No evidence of thrombus. Normal compressibility, respiratory phasicity and response to augmentation. Saphenofemoral Junction: No evidence of thrombus. Normal compressibility and flow on color Doppler imaging. Profunda Femoral Vein: No evidence of thrombus. Normal compressibility and flow on color Doppler imaging. Femoral Vein: No evidence of thrombus. Normal compressibility, respiratory phasicity and response to augmentation. Popliteal Vein: No evidence of thrombus. Normal  compressibility, respiratory phasicity and response to augmentation. Calf Veins: The LEFT peroneal vein is poorly visualized with limited visualization of the LEFT posterior tibial vein. No evidence of thrombus within the LEFT posterior tibial vein. Normal compressibility and flow on color Doppler imaging. Superficial Great Saphenous Vein: No evidence of thrombus. Normal compressibility. Venous Reflux:  None. Other Findings: A 2.7 cm x 1.1 cm x 1.9 cm heterogeneous hypoechoic area is seen within the soft tissues of the LEFT popliteal fossa. No abnormal flow is seen within this region on color Doppler evaluation. IMPRESSION: 1. Limited evaluation of the BILATERAL calf veins, without evidence of deep venous thrombosis in either lower extremity. 2. 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 Electronically Signed   By: Suzen Dials M.D.   On: 06/11/2024 23:25    ECHO as above  TELEMETRY reviewed by me 06/19/2024: sinus rhythm rate 60s, 40s overnight  EKG reviewed by me: NSR rate 71 bpm, repeat today with atrial flutter versus sinus tach rate 112 bpm  Data reviewed by me 06/19/2024: last 24h vitals tele labs imaging I/O ED provider note, admission H&P, hospitalist progress note  Principal Problem:   Acute on chronic diastolic CHF (congestive heart failure) (HCC) Active Problems:   COPD (chronic obstructive pulmonary disease) (HCC)   Myasthenia gravis (HCC)   Essential hypertension   Paroxysmal atrial fibrillation (HCC)   Hypothyroidism   Parkinson disease (HCC)   History of stroke   Obesity (BMI 30-39.9)   Acute renal failure superimposed on stage 3a chronic kidney disease (HCC)   Leg weakness   Cellulitis of lower extremity   Myocardial injury   Gross hematuria    ASSESSMENT AND PLAN:  PAWAN KNECHTEL is a 78 y.o. male  with a past medical history of persistent atrial fibrillation (recent successful TEE/DCCV on 02/2024, HX ablation in 2023), hx paroxsymal SVT,  chronic HFpEF (predominately right sided HF), severe TR, moderate pulmonary hypertension, hypertension, hyperlipidemia, myasthenia gravis, COPD who presented to the ED on 06/11/2024 for bilateral leg weakness, leg pain and swelling. Was treated for acute heart failure during admission. Was going to discharge to [facility] today however he developed AF RVR. Cardiology was consulted for further evaluation.   # Atrial flutter RVR versus sinus tach # Paroxysmal atrial fibrillation (s/p DCCV 02/2024) # Acute on chronic HFpEF Patient initially presented with AoCHF, was diuresed and on the day of anticipated discharge developed AF RVR. Has hx of paroxysmal AF, followed by EP outpatient. Echo this admission with EF 60-65%, no WMAs, grade I diastolic dysfunction, mild LA dilatation, severe RA dilation, mild to moderate TR.  -Amiodarone  and metoprolol  stopped due to bradycardia in the  40s overnight. IV metoprolol  prn ordered for recurrence of AF. Will defer starting PO given bradycardia, will plan to set patient up with EP follow up to discuss further plan for AF. -Continue eliquis  5 mg twice daily for stroke risk reduction.  -Continue spironolactone  25 mg daily, torsemide  40 mg daily. Previously prescribed farxiga  but not currently on this. -Minimal and flat troponin most consistent with demand/supply mismatch and not ACS in the setting of acute heart failure.   This patient's plan of care was discussed and created with Dr. Ammon and he is in agreement.  Signed: Danita Bloch, PA-C  06/19/2024, 8:33 AM [facility]

## 2024-06-19 NOTE — Progress Notes (Signed)
 Physical Therapy Treatment Patient Details Name: Francisco [name] MRN: [id number] DOB: 01-13-1946 Today's Date: 06/19/2024   History of Present Illness Pt is a 78 year old male presents with leg weakness, leg pain and swelling, admitted with Acute on chronic diastolic CHF, Possible cellulitis of lower extremity, Acute renal failure superimposed on stage 3a chronic kidney disease. PMH significant for A-fib on Eliquis , Parkinson's disease, HTN,  HLD, COPD, PVD, dCHD, stroke, hypothyroidism, bilateral carotid artery stenosis (s/p of stent of right carotid artery), myasthenia gravis, Merkel cell carcinoma    PT Comments  Patient is agreeable to PT session. He was seated in the chair. He continues to have left leg weakness. Patient required maximal assistance for standing. Left knee buckling with weight shifting while standing. Standing balance is poor with external support required. Patient reports feeling generalized weakness with mobility with no dizziness reported. He is not at his baseline level of functional independence. Recommend rehabilitation < 3 hours/day after this hospital stay. PT will continue to follow.    If plan is discharge home, recommend the following: Two people to help with walking and/or transfers;A lot of help with bathing/dressing/bathroom;Help with stairs or ramp for entrance;Assistance with cooking/housework   Can travel by private vehicle     No  Equipment Recommendations  None recommended by PT    Recommendations for Other Services       Precautions / Restrictions Precautions Precautions: Fall Recall of Precautions/Restrictions: Intact Restrictions Weight Bearing Restrictions Per Provider Order: No     Mobility  Bed Mobility               General bed mobility comments: not assessed as patient sitting up on arrival and post session    Transfers Overall transfer level: Needs assistance Equipment used: 1 person hand held assist Transfers: Sit  to/from Stand Sit to Stand: Max assist           General transfer comment: lifting assistance required for standing.    Ambulation/Gait             Pre-gait activities: weight shifting with left knee buckling with weight bearing General Gait Details: patient does not ambulate at baseline. walking not attempted   Stairs             Wheelchair Mobility     Tilt Bed    Modified Rankin (Stroke Patients Only)       Balance Overall balance assessment: Needs assistance Sitting-balance support: Feet supported Sitting balance-Leahy Scale: Good     Standing balance support: Bilateral upper extremity supported Standing balance-Leahy Scale: Poor Standing balance comment: external support required                            Communication Communication Communication: No apparent difficulties  Cognition Arousal: Alert Behavior During Therapy: WFL for tasks assessed/performed   PT - Cognitive impairments: No apparent impairments                         Following commands: Intact      Cueing Cueing Techniques: Verbal cues  Exercises General Exercises - Lower Extremity Ankle Circles/Pumps: AROM, Strengthening, Both, 10 reps, Seated Long Arc Quad: AAROM, Strengthening, AROM, Right, Left, 10 reps, Seated Hip ABduction/ADduction: AROM, AAROM, Strengthening Straight Leg Raises: 10 reps, Seated Hip Flexion/Marching: AROM, AAROM, Strengthening, Both, 10 reps, Seated Other Exercises Other Exercises: AROM for RLE, AAROM for LLE. cues for technique for strengthening  General Comments        Pertinent Vitals/Pain Pain Assessment Pain Assessment: Faces Faces Pain Scale: Hurts a little bit Pain Location: lower back Pain Descriptors / Indicators: Discomfort    Home Living                          Prior Function            PT Goals (current goals can now be found in the care plan section) Acute Rehab PT Goals Patient Stated  Goal: to get stronger PT Goal Formulation: With patient Time For Goal Achievement: 06/26/24 Potential to Achieve Goals: Fair Progress towards PT goals: Progressing toward goals    Frequency    Min 2X/week      PT Plan      Co-evaluation              AM-PAC PT 6 Clicks Mobility   Outcome Measure  Help needed turning from your back to your side while in a flat bed without using bedrails?: A Lot Help needed moving from lying on your back to sitting on the side of a flat bed without using bedrails?: A Lot Help needed moving to and from a bed to a chair (including a wheelchair)?: A Lot Help needed standing up from a chair using your arms (e.g., wheelchair or bedside chair)?: A Lot Help needed to walk in hospital room?: Total Help needed climbing 3-5 steps with a railing? : Total 6 Click Score: 10    End of Session   Activity Tolerance: Patient tolerated treatment well Patient left: in chair;with call bell/phone within reach;with chair alarm set   PT Visit Diagnosis: Muscle weakness (generalized) (M62.81);Unsteadiness on feet (R26.81)     Time: 8865-8844 PT Time Calculation (min) (ACUTE ONLY): 21 min  Charges:    $Therapeutic Activity: 8-22 mins PT General Charges $$ ACUTE PT VISIT: 1 Visit                     Francisco [name], PT, MPT    Francisco [name] 06/19/2024, 12:53 PM

## 2024-06-19 NOTE — Discharge Instructions (Signed)

## 2024-06-20 DIAGNOSIS — R310 Gross hematuria: Secondary | ICD-10-CM

## 2024-06-20 LAB — BASIC METABOLIC PANEL WITH GFR
Anion gap: 11 (ref 5–15)
BUN: 48 mg/dL — ABNORMAL HIGH (ref 8–23)
CO2: 27 mmol/L (ref 22–32)
Calcium: 8.6 mg/dL — ABNORMAL LOW (ref 8.9–10.3)
Chloride: 96 mmol/L — ABNORMAL LOW (ref 98–111)
Creatinine, Ser: 2.11 mg/dL — ABNORMAL HIGH (ref 0.61–1.24)
GFR, Estimated: 31 mL/min — ABNORMAL LOW (ref >60)
Glucose, Bld: 79 mg/dL (ref 70–99)
Potassium: 3.8 mmol/L (ref 3.5–5.1)
Sodium: 134 mmol/L — ABNORMAL LOW (ref 135–145)

## 2024-06-20 LAB — CBC
HCT: 35.1 % — ABNORMAL LOW (ref 39.0–52.0)
Hemoglobin: 11.4 g/dL — ABNORMAL LOW (ref 13.0–17.0)
MCH: 34.8 pg — ABNORMAL HIGH (ref 26.0–34.0)
MCHC: 32.5 g/dL (ref 30.0–36.0)
MCV: 107 fL — ABNORMAL HIGH (ref 80.0–100.0)
Platelets: 228 K/uL (ref 150–400)
RBC: 3.28 MIL/uL — ABNORMAL LOW (ref 4.22–5.81)
RDW: 13.7 % (ref 11.5–15.5)
WBC: 7.6 K/uL (ref 4.0–10.5)
nRBC: 0 % (ref 0.0–0.2)

## 2024-06-20 MED ORDER — DM-GUAIFENESIN ER 30-600 MG PO TB12: 1.0000 | ORAL_TABLET | Freq: Two times a day (BID) | ORAL | Status: DC | PRN

## 2024-06-20 MED ORDER — ENSURE PLUS HIGH PROTEIN PO LIQD: 237.0000 mL | Freq: Two times a day (BID) | ORAL | Status: DC

## 2024-06-20 MED ORDER — HYDROCODONE-ACETAMINOPHEN 10-325 MG PO TABS: 1.0000 | ORAL_TABLET | Freq: Four times a day (QID) | ORAL | 0 refills | Status: DC | PRN

## 2024-06-20 MED ORDER — SODIUM CHLORIDE 0.9 % IV SOLN: INTRAVENOUS | Status: AC

## 2024-06-20 MED ORDER — POLYVINYL ALCOHOL 1.4 % OP SOLN: 1.0000 [drp] | OPHTHALMIC | Status: DC | PRN

## 2024-06-20 MED ORDER — APIXABAN 5 MG PO TABS
5.0000 mg | ORAL_TABLET | Freq: Two times a day (BID) | ORAL | Status: DC
  Administered 2024-06-20: 5 mg via ORAL
  Filled 2024-06-20: qty 1

## 2024-06-20 NOTE — Plan of Care (Signed)
  Problem: Education: Goal: Ability to demonstrate management of disease process will improve Outcome: Progressing Goal: Ability to verbalize understanding of medication therapies will improve Outcome: Progressing Goal: Individualized Educational Video(s) Outcome: Progressing   Problem: Activity: Goal: Capacity to carry out activities will improve Outcome: Progressing   Problem: Cardiac: Goal: Ability to achieve and maintain adequate cardiopulmonary perfusion will improve Outcome: Progressing   Problem: Clinical Measurements: Goal: Ability to avoid or minimize complications of infection will improve Outcome: Progressing   Problem: Skin Integrity: Goal: Skin integrity will improve Outcome: Progressing   Problem: Education: Goal: Knowledge of General Education information will improve Description: Including pain rating scale, medication(s)/side effects and non-pharmacologic comfort measures Outcome: Progressing   Problem: Health Behavior/Discharge Planning: Goal: Ability to manage health-related needs will improve Outcome: Progressing   Problem: Clinical Measurements: Goal: Ability to maintain clinical measurements within normal limits will improve Outcome: Progressing Goal: Will remain free from infection Outcome: Progressing Goal: Diagnostic test results will improve Outcome: Progressing Goal: Respiratory complications will improve Outcome: Progressing Goal: Cardiovascular complication will be avoided Outcome: Progressing   Problem: Activity: Goal: Risk for activity intolerance will decrease Outcome: Progressing   Problem: Nutrition: Goal: Adequate nutrition will be maintained Outcome: Progressing   Problem: Coping: Goal: Level of anxiety will decrease Outcome: Progressing   Problem: Elimination: Goal: Will not experience complications related to bowel motility Outcome: Progressing Goal: Will not experience complications related to urinary  retention Outcome: Progressing   Problem: Pain Managment: Goal: General experience of comfort will improve and/or be controlled Outcome: Progressing   Problem: Safety: Goal: Ability to remain free from injury will improve Outcome: Progressing   Problem: Skin Integrity: Goal: Risk for impaired skin integrity will decrease Outcome: Progressing

## 2024-06-20 NOTE — Progress Notes (Signed)
 [facility] CLINIC CARDIOLOGY PROGRESS NOTE       Patient ID: Francisco [name] MRN: [id number] DOB/AGE: 11-22-1945 78 y.o.  Admit date: 06/11/2024 Referring Physician Dr. Amaryllis Dare Primary Physician Fernande Ophelia JINNY DOUGLAS, MD  Primary Cardiologist Dr. Wilburn Reason for Consultation AF RVR  HPI: Francisco [name] is a 78 y.o. male  with a past medical history of persistent atrial fibrillation (recent successful TEE/DCCV on 06/12, HX ablation in 2023), hx paroxsymal SVT, chronic HFpEF (predominately right sided HF), severe TR, moderate pulmonary hypertension, hypertension, hyperlipidemia, myasthenia gravis, COPD who presented to the ED on 06/11/2024 for bilateral leg weakness, leg pain and swelling. Was treated for acute heart failure during admission. Was going to discharge to [facility] today however he developed AF RVR. Cardiology was consulted for further evaluation.   Interval history: -Patient seen and examined this AM, resting comfortably in hospital bed. -Denies any cardiac complaints, feeling better overall today. -HR stable in the 50s. BP stable.   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   Carotid stenosis    CHF (congestive heart failure) (HCC)    CKD (chronic kidney disease)    CKD (chronic kidney disease) stage 3, GFR 30-59 ml/min (HCC)    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   DDD (degenerative disc disease), cervical    Dysrhythmia    post carotid stent bradycardia; PAF 09/2020   GERD (gastroesophageal reflux disease)    Hypercholesterolemia    Hypertension    Hypothyroidism    pt takes Levothyroxine  daily    Lumbosacral spinal stenosis    Myasthenia gravis, adult form (HCC)    PAD (peripheral artery disease)    Parkinson disease (HCC)    Shortness of breath    Lung MD- Dr Burnett Servant   Sleep apnea    do not use CPAP every night   Stroke (HCC) 05/31/2023    Past Surgical History:  Procedure Laterality Date   ANTERIOR CERVICAL DECOMP/DISCECTOMY FUSION  07/18/2011   Procedure: ANTERIOR CERVICAL DECOMPRESSION/DISCECTOMY FUSION 2 LEVELS;  Surgeon: Victory LABOR Pool;  Location: [facility];  Service: Neurosurgery;  Laterality: N/A;  cervical five-six, cervical six-seven anterior cervical discectomy and fusion   ATRIAL FIBRILLATION ABLATION     BACK SURGERY     in 1985 [facility]   BILATERAL CARPAL TUNNEL RELEASE     01/2020 Right, 04/2020 Left   CARDIAC CATHETERIZATION     2005 at [facility], no stents   CARDIOVERSION N/A 02/22/2024   Procedure: CARDIOVERSION;  Surgeon: Alluri, Keller BROCKS, MD;  Location: [facility] ORS;  Service: Cardiovascular;  Laterality: N/A;   CAROTID PTA/STENT INTERVENTION N/A 09/17/2020   Procedure: CAROTID PTA/STENT INTERVENTION;  Surgeon: Marea Selinda RAMAN, MD;  Location: [facility] INVASIVE CV LAB;  Service: Cardiovascular;  Laterality: N/A;   CATARACT EXTRACTION W/PHACO Left 01/06/2020   Procedure: CATARACT EXTRACTION PHACO AND INTRAOCULAR LENS PLACEMENT (IOC) ISTENT INJ LEFT 3.81  00:33.3;  Surgeon: Myrna Adine Anes, MD;  Location: [facility];  Service: Ophthalmology;  Laterality: Left;   CATARACT EXTRACTION W/PHACO  Right 02/03/2020   Procedure: CATARACT EXTRACTION PHACO AND INTRAOCULAR LENS PLACEMENT (IOC) RIGHT ISTENT INJ;  Surgeon: Myrna Adine Anes, MD;  Location: [facility];  Service: Ophthalmology;  Laterality: Right;  4.29 0:35.6   COLONOSCOPY     HERNIA REPAIR Left    inguinal hernia repair in 1985   LUMBAR LAMINECTOMY/DECOMPRESSION MICRODISCECTOMY Left 02/24/2014   Procedure: LUMBAR LAMINECTOMY/DECOMPRESSION MICRODISCECTOMY LUMBAR THREE-FOUR, FOUR-FIVE, LEFT  FIVE-SACRAL ONE ;  Surgeon: Victory DELENA Gunnels, MD;  Location: [facility];  Service: Neurosurgery;  Laterality: Left;  LUMBAR LAMINECTOMY/DECOMPRESSION MICRODISCECTOMY LUMBAR THREE-FOUR, FOUR-FIVE, LEFT FIVE-SACRAL ONE    LUMBAR LAMINECTOMY/DECOMPRESSION MICRODISCECTOMY N/A 05/03/2021   Procedure: Laminectomy and Foraminotomy - L2-L3;  Surgeon: Gunnels Victory, MD;  Location: [facility] OR;  Service: Neurosurgery;  Laterality: N/A;  3C   POSTERIOR CERVICAL FUSION/FORAMINOTOMY N/A 08/07/2020   Procedure: C3-6 POSTERIOR FUSION WITH DECOMPRESSION;  Surgeon: Clois Fret, MD;  Location: [facility] ORS;  Service: Neurosurgery;  Laterality: N/A;   PROSTATECTOMY  04/2013   [facility] Dr ozell Geralds    TEE WITHOUT CARDIOVERSION N/A 02/22/2024   Procedure: ECHOCARDIOGRAM, TRANSESOPHAGEAL;  Surgeon: Alluri, Keller BROCKS, MD;  Location: [facility] ORS;  Service: Cardiovascular;  Laterality: N/A;    Medications Prior to Admission  Medication Sig Dispense Refill Last Dose/Taking   acetaminophen  (TYLENOL ) 325 MG tablet Take 2 tablets (650 mg total) by mouth every 6 (six) hours as needed for mild pain (pain score 1-3) or fever (or Fever >/= 101).   Past Month   apixaban  (ELIQUIS ) 5 MG TABS tablet Take 5 mg by mouth 2 (two) times daily.   06/12/2024 Morning   azaTHIOprine  (IMURAN ) 50 MG tablet Take 150 mg by mouth daily.    06/12/2024 Morning   carbidopa -levodopa  (SINEMET  IR) 25-100 MG tablet Take 1 tablet by mouth 3 (three) times daily.   06/12/2024 Morning   Cholecalciferol  (D3-1000) 25 MCG (1000 UT) capsule Take 2,000 Units by mouth daily.   06/12/2024 Morning   cyanocobalamin  (VITAMIN B12) 1000 MCG tablet Take 1,000 mcg by mouth daily.   06/12/2024 Morning   HYDROcodone -acetaminophen  (NORCO) 10-325 MG tablet Take 1-2 tablets by mouth every 6 (six) hours as needed for moderate pain (pain score 4-6) or severe pain (pain score 7-10). 10 tablet 0 06/12/2024 Morning   levothyroxine  (SYNTHROID ) 50 MCG tablet Take 1 tablet (50 mcg total) by mouth  daily at 6 (six) AM. 30 tablet 0 06/12/2024 Morning   meclizine  (ANTIVERT ) 25 MG tablet Take 1 tablet by mouth 3 (three) times daily as needed for dizziness.   06/12/2024 Morning   polyethylene glycol (MIRALAX  / GLYCOLAX ) 17 g packet Take 17 g by mouth daily. Skip the dose if no constipation   Past Week   predniSONE  (DELTASONE ) 10 MG tablet Take 10 mg by mouth every Monday, Wednesday, and Friday.   06/11/2024   spironolactone  (ALDACTONE ) 25 MG tablet Take 1 tablet (25 mg total) by mouth daily.   06/12/2024 Morning   amiodarone  (PACERONE ) 200 MG tablet Take 1 tablet (200 mg total) by mouth daily. (Patient not taking: Reported on 06/12/2024)   Not Taking   bumetanide  (BUMEX ) 2 MG tablet Take 2 mg by mouth daily. (Patient not taking: Reported on 06/12/2024)   Not Taking   dapagliflozin  propanediol (FARXIGA ) 10 MG TABS tablet Take 1 tablet (10 mg total) by mouth daily. (Patient not taking: Reported on 06/12/2024)   Not Taking   pregabalin  (LYRICA ) 50 MG capsule Take 50 mg by mouth 2 (two) times daily. (Patient not taking:  Reported on 06/12/2024)   Not Taking   Social History   Socioeconomic History   Marital status: Widowed    Spouse name: [name]   Number of children: Not on file   Years of education: Not on file   Highest education level: Not on file  Occupational History   Not on file  Tobacco Use   Smoking status: Former    Current packs/day: 0.00    Average packs/day: 1 pack/day for 20.0 years (20.0 ttl pk-yrs)    Types: Cigarettes    Start date: 09/12/1981    Quit date: 09/12/2001    Years since quitting: 22.7   Smokeless tobacco: Never  Vaping Use   Vaping status: Never Used  Substance and Sexual Activity   Alcohol use: Yes    Alcohol/week: 3.0 standard drinks of alcohol    Types: 3 Glasses of wine per week    Comment: 3 glasses a wine a week   Drug use: No   Sexual activity: Yes  Other Topics Concern   Not on file  Social History Narrative   Not on file   Social Drivers of Health    Financial Resource Strain: Low Risk  (04/30/2024)   Received from [facility]   Overall Financial Resource Strain (CARDIA)    Difficulty of Paying Living Expenses: Not hard at all  Food Insecurity: No Food Insecurity (06/12/2024)   Hunger Vital Sign    Worried About Running Out of Food in the Last Year: Never true    Ran Out of Food in the Last Year: Never true  Transportation Needs: No Transportation Needs (06/12/2024)   PRAPARE - Administrator, Civil Service (Medical): No    Lack of Transportation (Non-Medical): No  Recent Concern: Transportation Needs - Unmet Transportation Needs (05/17/2024)   PRAPARE - Administrator, Civil Service (Medical): Yes    Lack of Transportation (Non-Medical): Yes  Physical Activity: Not on file  Stress: Not on file  Social Connections: Moderately Isolated (06/12/2024)   Social Connection and Isolation Panel    Frequency of Communication with Friends and Family: Once a week    Frequency of Social Gatherings with Friends and Family: Once a week    Attends Religious Services: More than 4 times per year    Active Member of [employer] or Organizations: Yes    Attends Banker Meetings: More than 4 times per year    Marital Status: Widowed  Intimate Partner Violence: Not At Risk (06/12/2024)   Humiliation, Afraid, Rape, and Kick questionnaire    Fear of Current or Ex-Partner: No    Emotionally Abused: No    Physically Abused: No    Sexually Abused: No    Family History  Problem Relation Age of Onset   Hypertension Mother    Stroke Mother    Stroke Father      Vitals:   06/19/24 2316 06/20/24 0401 06/20/24 0500 06/20/24 0753  BP: (!) 125/52 124/61  130/64  Pulse: (!) 43 (!) 44  (!) 57  Resp: 20 18    Temp: 98.3 F (36.8 C) 97.8 F (36.6 C)  97.7 F (36.5 C)  TempSrc:    Oral  SpO2: 95% 95%  100%  Weight:   98.2 kg   Height:        PHYSICAL EXAM General: Chronically ill-appearing elderly  male, well nourished, in no acute distress. HEENT: Normocephalic and atraumatic. Neck: No JVD.  Lungs: Normal respiratory effort  on room air. Clear bilaterally to auscultation. No wheezes, crackles, rhonchi.  Heart: HRRR. Normal S1 and S2 without gallops or murmurs.  Abdomen: Non-distended appearing.  Msk: Normal strength and tone for age. Extremities: Warm and well perfused. No clubbing, cyanosis.  No edema.  Neuro: Alert and oriented X 3. Psych: Answers questions appropriately.   Labs: Basic Metabolic Panel: Recent Labs    06/19/24 0423 06/20/24 0314  NA 136 134*  K 4.1 3.8  CL 97* 96*  CO2 29 27  GLUCOSE 92 79  BUN 53* 48*  CREATININE 2.38* 2.11*  CALCIUM  8.8* 8.6*   Liver Function Tests: No results for input(s): AST, ALT, ALKPHOS, BILITOT, PROT, ALBUMIN  in the last 72 hours. No results for input(s): LIPASE, AMYLASE in the last 72 hours. CBC: Recent Labs    06/19/24 0423 06/20/24 0314  WBC 8.3 7.6  HGB 11.8* 11.4*  HCT 36.8* 35.1*  MCV 106.1* 107.0*  PLT 237 228   Cardiac Enzymes: No results for input(s): CKTOTAL, CKMB, CKMBINDEX, TROPONINIHS in the last 72 hours. BNP: No results for input(s): BNP in the last 72 hours. D-Dimer: No results for input(s): DDIMER in the last 72 hours. Hemoglobin A1C: No results for input(s): HGBA1C in the last 72 hours. Fasting Lipid Panel: No results for input(s): CHOL, HDL, LDLCALC, TRIG, CHOLHDL, LDLDIRECT in the last 72 hours. Thyroid  Function Tests: No results for input(s): TSH, T4TOTAL, T3FREE, THYROIDAB in the last 72 hours.  Invalid input(s): FREET3 Anemia Panel: No results for input(s): VITAMINB12, FOLATE, FERRITIN, TIBC, IRON , RETICCTPCT in the last 72 hours.   Radiology: ECHOCARDIOGRAM COMPLETE Result Date: 06/14/2024    ECHOCARDIOGRAM REPORT   Patient Name:   Francisco [name] Date of Exam: 06/13/2024 Medical Rec #:  [id number]          Height:        70.0 in Accession #:    [id number]         Weight:       207.0 lb Date of Birth:  [birth year]          BSA:          2.118 m Patient Age:    35 years           BP:           117/56 mmHg Patient Gender: M                  HR:           55 bpm. Exam Location:  [facility] Procedure: 2D Echo, Color Doppler and Cardiac Doppler (Both Spectral and Color            Flow Doppler were utilized during procedure). Indications:     Abnormal ECG R94.31  History:         Patient has prior history of Echocardiogram examinations, most                  recent 06/01/2023. Abnormal ECG.  Sonographer:     Ashley McNeely-Sloane Referring Phys:  [id number] SUMAYYA AMIN Diagnosing Phys: Sabina Custovic IMPRESSIONS  1. Left ventricular ejection fraction, by estimation, is 60 to 65%. The left ventricle has normal function. The left ventricle has no regional wall motion abnormalities. Left ventricular diastolic parameters are consistent with Grade I diastolic dysfunction (impaired relaxation).  2. Right ventricular systolic function is low normal. The right ventricular size is moderately enlarged. There is normal pulmonary artery systolic pressure. The estimated right ventricular systolic pressure  is 33.6 mmHg.  3. Left atrial size was mildly dilated.  4. Right atrial size was severely dilated.  5. The mitral valve is normal in structure. No evidence of mitral valve regurgitation. No evidence of mitral stenosis.  6. Tricuspid valve regurgitation is mild to moderate.  7. The aortic valve is normal in structure. Aortic valve regurgitation is not visualized. No aortic stenosis is present.  8. The inferior vena cava is normal in size with greater than 50% respiratory variability, suggesting right atrial pressure of 3 mmHg. FINDINGS  Left Ventricle: Left ventricular ejection fraction, by estimation, is 60 to 65%. The left ventricle has normal function. The left ventricle has no regional wall motion abnormalities. The left ventricular internal cavity size was  normal in size. There is  no left ventricular hypertrophy. Left ventricular diastolic parameters are consistent with Grade I diastolic dysfunction (impaired relaxation). Right Ventricle: The right ventricular size is moderately enlarged. No increase in right ventricular wall thickness. Right ventricular systolic function is low normal. There is normal pulmonary artery systolic pressure. The tricuspid regurgitant velocity  is 2.53 m/s, and with an assumed right atrial pressure of 8 mmHg, the estimated right ventricular systolic pressure is 33.6 mmHg. Left Atrium: Left atrial size was mildly dilated. Right Atrium: Right atrial size was severely dilated. Pericardium: There is no evidence of pericardial effusion. Mitral Valve: The mitral valve is normal in structure. No evidence of mitral valve regurgitation. No evidence of mitral valve stenosis. MV peak gradient, 4.0 mmHg. The mean mitral valve gradient is 1.0 mmHg. Tricuspid Valve: The tricuspid valve is normal in structure. Tricuspid valve regurgitation is mild to moderate. Aortic Valve: The aortic valve is normal in structure. Aortic valve regurgitation is not visualized. No aortic stenosis is present. Aortic valve mean gradient measures 4.0 mmHg. Aortic valve peak gradient measures 7.8 mmHg. Aortic valve area, by VTI measures 2.22 cm. Pulmonic Valve: The pulmonic valve was normal in structure. Pulmonic valve regurgitation is not visualized. Aorta: The aortic root is normal in size and structure. Venous: The inferior vena cava is normal in size with greater than 50% respiratory variability, suggesting right atrial pressure of 3 mmHg. IAS/Shunts: No atrial level shunt detected by color flow Doppler.  LEFT VENTRICLE PLAX 2D LVIDd:         4.20 cm     Diastology LVIDs:         2.20 cm     LV e' medial:    15.00 cm/s LV PW:         1.50 cm     LV E/e' medial:  6.3 LV IVS:        1.20 cm     LV e' lateral:   11.40 cm/s LVOT diam:     2.00 cm     LV E/e' lateral: 8.3 LV  SV:         63 LV SV Index:   30 LVOT Area:     3.14 cm  LV Volumes (MOD) LV vol d, MOD A2C: 33.6 ml LV vol d, MOD A4C: 37.0 ml LV vol s, MOD A2C: 17.1 ml LV vol s, MOD A4C: 14.4 ml LV SV MOD A2C:     16.5 ml LV SV MOD A4C:     37.0 ml LV SV MOD BP:      22.0 ml RIGHT VENTRICLE RV Basal diam:  5.50 cm RV Mid diam:    4.30 cm RV S prime:     11.90 cm/s TAPSE (M-mode):  1.8 cm LEFT ATRIUM           Index        RIGHT ATRIUM           Index LA diam:      4.30 cm 2.03 cm/m   RA Area:     27.30 cm LA Vol (A4C): 47.4 ml 22.38 ml/m  RA Volume:   92.90 ml  43.86 ml/m  AORTIC VALVE                    PULMONIC VALVE AV Area (Vmax):    2.22 cm     PV Vmax:        0.68 m/s AV Area (Vmean):   2.03 cm     PV Vmean:       44.900 cm/s AV Area (VTI):     2.22 cm     PV VTI:         0.108 m AV Vmax:           140.00 cm/s  PV Peak grad:   1.9 mmHg AV Vmean:          92.400 cm/s  PV Mean grad:   1.0 mmHg AV VTI:            0.285 m      RVOT Peak grad: 2 mmHg AV Peak Grad:      7.8 mmHg AV Mean Grad:      4.0 mmHg LVOT Vmax:         99.00 cm/s LVOT Vmean:        59.700 cm/s LVOT VTI:          0.201 m LVOT/AV VTI ratio: 0.71  AORTA Ao Root diam: 3.50 cm Ao Asc diam:  3.00 cm MITRAL VALVE               TRICUSPID VALVE MV Area (PHT): 2.80 cm    TR Peak grad:   25.6 mmHg MV Area VTI:   1.91 cm    TR Mean grad:   19.0 mmHg MV Peak grad:  4.0 mmHg    TR Vmax:        253.00 cm/s MV Mean grad:  1.0 mmHg    TR Vmean:       209.0 cm/s MV Vmax:       1.00 m/s MV Vmean:      50.3 cm/s   SHUNTS MV Decel Time: 271 msec    Systemic VTI:  0.20 m MV E velocity: 94.30 cm/s  Systemic Diam: 2.00 cm                            Pulmonic VTI:  0.117 m Annalee Custovic Electronically signed by Annalee Casa Signature Date/Time: 06/14/2024/8:41:18 AM    Final    MR BRAIN WO CONTRAST Result Date: 06/12/2024 CLINICAL DATA:  78 year old male with neurologic deficit, increasing lower extremity weakness. Paroxysmal atrial fibrillation. EXAM: MRI HEAD  WITHOUT CONTRAST TECHNIQUE: Multiplanar, multiecho pulse sequences of the brain and surrounding structures were obtained without intravenous contrast. COMPARISON:  Head CT yesterday.  Brain MRI 05/29/2023, and earlier. FINDINGS: Brain: Stable cerebral volume since last year. No restricted diffusion to suggest acute infarction. No midline shift, mass effect, evidence of mass lesion, ventriculomegaly, extra-axial collection or acute intracranial hemorrhage. Cervicomedullary junction and pituitary are within normal limits. Redemonstrated chronic small area of cortical encephalomalacia right middle frontal gyrus (series 9, image 39).  Occasional chronic microhemorrhage in both cerebral hemispheres (minimal for age). Patchy bilateral periventricular white matter T2 and FLAIR hyperintensity which has progressed since a 2021 MRI, stable from last year and moderate for age. Mild to moderate chronic T2 heterogeneity also in the pons, also progressed since 2021. No other cortical encephalomalacia identified. Stable mild for age T2 heterogeneity in the deep gray nuclei. Cerebellum appears negative. Vascular: Major intracranial vascular flow voids are stable. Mild generalized intracranial artery tortuosity. Skull and upper cervical spine: Chronic cervical spine fusion, partially visible hardware artifact. Background bone marrow signal appears normal, degenerative ligamentous hypertrophy about the odontoid process does not appear significantly changed. Sinuses/Orbits: Stable, negative. Other: Mastoids well aerated.  Negative visible scalp and face. IMPRESSION: 1. No acute intracranial abnormality. 2. Chronic small and medium-sized vessel ischemia, overall moderate for age. Chronic small vessel changes do appear progressed in the cerebral white matter and pons since a 2021 MRI, but stable brain compared to MRI last year. 3. Chronic cervical spine operative fusion. Electronically Signed   By: VEAR Hurst M.D.   On: 06/12/2024 04:28    DG Chest Port 1 View Result Date: 06/11/2024 CLINICAL DATA:  Edema. EXAM: PORTABLE CHEST 1 VIEW COMPARISON:  03/27/2024, CT 05/17/2024 FINDINGS: Chronic elevation of right hemidiaphragm with adjacent bandlike scarring. Mild cardiomegaly stable. No pulmonary edema. No significant pleural effusion. No acute airspace disease. No pneumothorax. IMPRESSION: 1. No acute chest findings. 2. Chronic elevation of right hemidiaphragm with adjacent scarring. Electronically Signed   By: Andrea Gasman M.D.   On: 06/11/2024 23:38   CT Head Wo Contrast Result Date: 06/11/2024 CLINICAL DATA:  Increased lower extremity weakness. EXAM: CT HEAD WITHOUT CONTRAST TECHNIQUE: Contiguous axial images were obtained from the base of the skull through the vertex without intravenous contrast. RADIATION DOSE REDUCTION: This exam was performed according to the departmental dose-optimization program which includes automated exposure control, adjustment of the mA and/or kV according to patient size and/or use of iterative reconstruction technique. COMPARISON:  May 17, 2024 FINDINGS: Brain: No evidence of acute infarction, hemorrhage, hydrocephalus, extra-axial collection or mass lesion/mass effect. Vascular: Moderate to marked severity bilateral cavernous carotid artery calcification is noted. Skull: Normal. Negative for fracture or focal lesion. Sinuses/Orbits: No acute finding. Other: None. IMPRESSION: No acute intracranial abnormality. Electronically Signed   By: Suzen Dials M.D.   On: 06/11/2024 23:27   US  Venous Img Lower Bilateral Result Date: 06/11/2024 CLINICAL DATA:  Bilateral leg pain. EXAM: BILATERAL LOWER EXTREMITY VENOUS DOPPLER ULTRASOUND TECHNIQUE: Gray-scale sonography with graded compression, as well as color Doppler and duplex ultrasound were performed to evaluate the lower extremity deep venous systems from the level of the common femoral vein and including the common femoral, femoral, profunda femoral,  popliteal and calf veins including the posterior tibial, peroneal and gastrocnemius veins when visible. The superficial great saphenous vein was also interrogated. Spectral Doppler was utilized to evaluate flow at rest and with distal augmentation maneuvers in the common femoral, femoral and popliteal veins. COMPARISON:  May 17, 2024 FINDINGS: RIGHT LOWER EXTREMITY Common Femoral Vein: No evidence of thrombus. Normal compressibility, respiratory phasicity and response to augmentation. Saphenofemoral Junction: No evidence of thrombus. Normal compressibility and flow on color Doppler imaging. Profunda Femoral Vein: No evidence of thrombus. Normal compressibility and flow on color Doppler imaging. Femoral Vein: No evidence of thrombus. Normal compressibility, respiratory phasicity and response to augmentation. Popliteal Vein: No evidence of thrombus. Normal compressibility, respiratory phasicity and response to augmentation. Calf Veins: There is limited visualization  of the LEFT posterior tibial vein and LEFT peroneal vein, without evidence of thrombus. Normal compressibility and flow on color Doppler imaging. Superficial Great Saphenous Vein: No evidence of thrombus. Normal compressibility. Venous Reflux:  None. Other Findings: A 5.6 cm x 1.4 cm x 5.1 cm complex fluid collection is seen within the soft tissues of the RIGHT popliteal fossa. LEFT LOWER EXTREMITY Common Femoral Vein: No evidence of thrombus. Normal compressibility, respiratory phasicity and response to augmentation. Saphenofemoral Junction: No evidence of thrombus. Normal compressibility and flow on color Doppler imaging. Profunda Femoral Vein: No evidence of thrombus. Normal compressibility and flow on color Doppler imaging. Femoral Vein: No evidence of thrombus. Normal compressibility, respiratory phasicity and response to augmentation. Popliteal Vein: No evidence of thrombus. Normal compressibility, respiratory phasicity and response to  augmentation. Calf Veins: The LEFT peroneal vein is poorly visualized with limited visualization of the LEFT posterior tibial vein. No evidence of thrombus within the LEFT posterior tibial vein. Normal compressibility and flow on color Doppler imaging. Superficial Great Saphenous Vein: No evidence of thrombus. Normal compressibility. Venous Reflux:  None. Other Findings: A 2.7 cm x 1.1 cm x 1.9 cm heterogeneous hypoechoic area is seen within the soft tissues of the LEFT popliteal fossa. No abnormal flow is seen within this region on color Doppler evaluation. IMPRESSION: 1. Limited evaluation of the BILATERAL calf veins, without evidence of deep venous thrombosis in either lower extremity. 2. 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 Electronically Signed   By: Suzen Dials M.D.   On: 06/11/2024 23:25    ECHO as above  TELEMETRY reviewed by me 06/20/2024: sinus rhythm rate 50s  EKG reviewed by me: NSR rate 71 bpm, repeat today with atrial flutter versus sinus tach rate 112 bpm  Data reviewed by me 06/20/2024: last 24h vitals tele labs imaging I/O ED provider note, admission H&P, hospitalist progress note  Principal Problem:   Acute on chronic diastolic CHF (congestive heart failure) (HCC) Active Problems:   COPD (chronic obstructive pulmonary disease) (HCC)   Myasthenia gravis (HCC)   Essential hypertension   Paroxysmal atrial fibrillation (HCC)   Hypothyroidism   Parkinson disease (HCC)   History of stroke   Obesity (BMI 30-39.9)   Acute renal failure superimposed on stage 3a chronic kidney disease (HCC)   Leg weakness   Cellulitis of lower extremity   Myocardial injury   Gross hematuria    ASSESSMENT AND PLAN:  Francisco [name] is a 78 y.o. male  with a past medical history of persistent atrial fibrillation (recent successful TEE/DCCV on 02/2024, HX ablation in 2023), hx paroxsymal SVT, chronic HFpEF (predominately right sided HF), severe TR, moderate pulmonary  hypertension, hypertension, hyperlipidemia, myasthenia gravis, COPD who presented to the ED on 06/11/2024 for bilateral leg weakness, leg pain and swelling. Was treated for acute heart failure during admission. Was going to discharge to [facility] today however he developed AF RVR. Cardiology was consulted for further evaluation.   # Atrial flutter RVR versus sinus tach # Paroxysmal atrial fibrillation (s/p DCCV 02/2024) # Acute on chronic HFpEF Patient initially presented with AoCHF, was diuresed and on the day of anticipated discharge developed AF RVR. Has hx of paroxysmal AF, followed by EP outpatient. Echo this admission with EF 60-65%, no WMAs, grade I diastolic dysfunction, mild LA dilatation, severe RA dilation, mild to moderate TR.  -Amiodarone  and metoprolol  stopped due to bradycardia in the 40s. IV metoprolol  prn ordered for recurrence of AF. Will defer starting PO given bradycardia,  outpatient follow up scheduled with EP on 10/24. -Resume eliquis  5 mg twice daily for stroke risk reduction. This was held due to episode of hematuria. -Torsemide  held due to AKI. Continue spironolactone  25 mg daily. Previously prescribed farxiga  but not currently on this. -Minimal and flat troponin most consistent with demand/supply mismatch and not ACS in the setting of acute heart failure.   This patient's plan of care was discussed and created with Dr. Ammon and he is in agreement.  Signed: Danita Bloch, PA-C  06/20/2024, 10:54 AM [facility]

## 2024-06-20 NOTE — Discharge Summary (Signed)
 Physician Discharge Summary   Patient: Francisco [name] MRN: [id number] DOB: [birth year]  Admit date:     06/11/2024  Discharge date: 06/20/24  Discharge Physician: Amaryllis Dare   PCP: Fernande Ophelia JINNY DOUGLAS, MD   Recommendations at discharge:  Please obtain CBC and renal function on follow-up We are holding amiodarone , metoprolol  and torsemide -cardiology will restart when appropriate. Follow-up with cardiology Follow-up with electrophysiologist-cardiology is arranging Follow-up with primary care provider  Discharge Diagnoses: Principal Problem:   Acute on chronic diastolic CHF (congestive heart failure) (HCC) Active Problems:   Myocardial injury   Paroxysmal atrial fibrillation (HCC)   Gross hematuria   History of stroke   Essential hypertension   Myasthenia gravis (HCC)   COPD (chronic obstructive pulmonary disease) (HCC)   Hypothyroidism   Cellulitis of lower extremity   Acute renal failure superimposed on stage 3a chronic kidney disease (HCC)   Parkinson disease (HCC)   Leg weakness   Obesity (BMI 30-39.9)   Hospital Course: Partly taken from H&P.  Francisco [name] is a 78 y.o. male with medical history significant of A-fib on Eliquis , Parkinson's disease, HTN,  HLD, COPD, PVD, dCHD, stroke, hypothyroidism, bilateral carotid artery stenosis (s/p of stent of right carotid artery), myasthenia gravis, Merkel cell carcinoma, who presents with bilateral leg weakness, leg pain and swelling.   He was also having left leg numbness for about 2 months.  On presentation stable vitals with heart rate ranging between 48-60, labs with BNP of 1037, INR 1.6, CXR with elevation of right hemidiaphragm. CT head was negative for any acute intracranial abnormality.  Lower extremity venous Doppler negative for DVT but did show Baker's cyst  Patient was admitted for acute on chronic HFpEF.  Started on IV Lasix .  Also started on ceftriaxone  for concern of lower extremity cellulitis  10/1:  Vital stable, troponin 35>33>25, creatinine with some improvement to 2.25, preliminary blood cultures negative.  Mildly elevated CRP at 2.2 and elevated ESR at 70.  PT is recommending SNF.  10/2: Hemodynamically stable, creatinine improving now at 2.01.  Improving lower extremity cellulitis so antibiotics switched with cefadroxil. Continuing IV Lasix  and TOC is looking for placement.  10/3: Remained hemodynamically stable, renal function continued to improve, will continue with IV Lasix  while TOC is working on placement.  10/4: Hemodynamically stable, renal function continue to improve so continuing IV diuresis.  Medically stable to go to rehab.  10/5: Remained hemodynamically stable and kidney functions continue to improve now creatinine at 1.41.  Awaiting SNF placement  10/6: Hemodynamically stable, small increase in creatinine, switching him from IV Lasix  to torsemide .  Still pending disposition.  10/7: Obtained insurance authorization for rehab but patient developed a flutter between 1 conduction with RVR, cardiology was consulted and he was given loading dose of amiodarone .  Later also developed gross hematuria so holding Eliquis  for now and monitoring CBC.  10/8: Vitals with soft blood pressure at 85/41 this morning requiring 500 cc bolus, blood pressure improved.  Worsening renal function with creatinine at 2.38 from 1.53 today.  Holding diuretic. No further hematuria, hemoglobin at 11.8.  10/9: Remained hemodynamically stable with heart rate mostly in 50s.  Eliquis  was resumed and there was no further bleeding noted.  Creatinine with some improvement to 2.11 so give some more IV fluid.  Case was discussed with cardiology and we we will hold torsemide , amiodarone  and metoprolol , cardiology will follow-up closely and will restart these medications when appropriate.  They also made an appointment with EP  for later this month for further evaluation of his A-fib.  Please hold Eliquis  if you  notice any abnormal bleeding.  Patient is being discharged to SNF for further rehab.  He will continue on current medications and need to have a close follow-up with his providers for further assistance.  Assessment and Plan: * Acute on chronic diastolic CHF (congestive heart failure) (HCC) Patient admitted with lower extremity edema and elevated BNP at 1037 consistent with CHF exacerbation.  Prior echo done in June 2025 with normal EF Creatinine continue to improve -Switching IV Lasix  with torsemide -which is being held currently due to increase in creatinine and cardiology will restart when appropriate - Continue with spironolactone   Myocardial injury Mildly elevated troponin at 35 with a downward trend on subsequent check.  No chest pain.  Likely demand ischemia with CHF exacerbation.  Gross hematuria Patient had an episode of gross hematuria with a large clot in it.  No further episode -Hemoglobin at 11.8 - Eliquis  was resumed  Paroxysmal atrial fibrillation (HCC) Converted back to sinus rhythm and again having borderline bradycardia - Cardiology was consulted and patient was started on loading dose of amiodarone .  Which was next day discontinued due to developing bradycardia again - Eliquis  was held for 1 day for concern of gross hematuria, we restarted the next day. - Cardiology is arranging EP evaluation  History of stroke - On Eliquis  due to A-fib  Essential hypertension Blood pressure initially soft requiring a bolus - Holding torsemide  and spironolactone   Myasthenia gravis (HCC) -Continue home prednisone  10 mg on Monday Wednesday Friday -  received Solu-Cortef 100 mg as a stress dose - Continue home azathioprine  50 mg daily  COPD (chronic obstructive pulmonary disease) (HCC) No acute concern. - Continue with as needed bronchodilators  Hypothyroidism - Continue with home Synthroid   Cellulitis of lower extremity Concern of bilateral lower extremity cellulitis with  edema and erythema.  No leukocytosis, elevated CRP and ESR.  Preliminary blood cultures negative - Completed a course of antibiotics and symptoms resolved  Acute renal failure superimposed on stage 3a chronic kidney disease (HCC) Most recent creatinine of 1.68 on 05/22/2024. It was 2.50 on admission, Likely cardiorenal with concern of CHF exacerbation.  Creatinine initially improved and then started trending up, currently holding torsemide  and continuing spironolactone .  Parkinson disease (HCC) - Continue Sinemet   Leg weakness Patient has worsening bilateral leg weakness for more than 2 months, recently came home from rehab.  MRI brain with no acute intracranial abnormality.  Some progression of chronic small vessel disease. - PT is recommending SNF  Obesity (BMI 30-39.9) Estimated body mass index is 31.79 kg/m as calculated from the following:   Height as of this encounter: 5' 10 (1.778 m).   Weight as of this encounter: 100.5 kg.   Patient with class I obesity. - Encouraged weight loss with exercise and healthy diet   Pain control - [place]  Controlled Substance Reporting System database was reviewed. and patient was instructed, not to drive, operate heavy machinery, perform activities at heights, swimming or participation in water activities or provide baby-sitting services while on Pain, Sleep and Anxiety Medications; until their outpatient Physician has advised to do so again. Also recommended to not to take more than prescribed Pain, Sleep and Anxiety Medications.  Consultants: Cardiology Procedures performed: None Disposition: Skilled nursing facility Diet recommendation:  Discharge Diet Orders (From admission, onward)     Start     Ordered   06/20/24 0000  Diet - low sodium  heart healthy        06/20/24 1253           Cardiac diet DISCHARGE MEDICATION: Allergies as of 06/20/2024       Reactions   Azithromycin Other (See Comments)   Avoid due to myasthenia  gravis   Codeine Nausea And Vomiting        Medication List     STOP taking these medications    amiodarone  200 MG tablet Commonly known as: PACERONE    bumetanide  2 MG tablet Commonly known as: BUMEX    dapagliflozin  propanediol 10 MG Tabs tablet Commonly known as: FARXIGA    pregabalin  50 MG capsule Commonly known as: LYRICA        TAKE these medications    acetaminophen  325 MG tablet Commonly known as: TYLENOL  Take 2 tablets (650 mg total) by mouth every 6 (six) hours as needed for mild pain (pain score 1-3) or fever (or Fever >/= 101).   artificial tears ophthalmic solution Place 1 drop into both eyes as needed for dry eyes.   azaTHIOprine  50 MG tablet Commonly known as: IMURAN  Take 150 mg by mouth daily.   carbidopa -levodopa  25-100 MG tablet Commonly known as: SINEMET  IR Take 1 tablet by mouth 3 (three) times daily.   cyanocobalamin  1000 MCG tablet Commonly known as: VITAMIN B12 Take 1,000 mcg by mouth daily.   D3-1000 25 MCG (1000 UT) capsule Generic drug: Cholecalciferol  Take 2,000 Units by mouth daily.   dextromethorphan -guaiFENesin  30-600 MG 12hr tablet Commonly known as: MUCINEX  DM Take 1 tablet by mouth 2 (two) times daily as needed for cough.   Eliquis  5 MG Tabs tablet Generic drug: apixaban  Take 5 mg by mouth 2 (two) times daily.   feeding supplement Liqd Take 237 mLs by mouth 2 (two) times daily between meals.   HYDROcodone -acetaminophen  10-325 MG tablet Commonly known as: NORCO Take 1-2 tablets by mouth every 6 (six) hours as needed for moderate pain (pain score 4-6) or severe pain (pain score 7-10).   levothyroxine  50 MCG tablet Commonly known as: SYNTHROID  Take 1 tablet (50 mcg total) by mouth daily at 6 (six) AM.   meclizine  25 MG tablet Commonly known as: ANTIVERT  Take 1 tablet by mouth 3 (three) times daily as needed for dizziness.   polyethylene glycol 17 g packet Commonly known as: MIRALAX  / GLYCOLAX  Take 17 g by mouth  daily. Skip the dose if no constipation   predniSONE  10 MG tablet Commonly known as: DELTASONE  Take 10 mg by mouth every Monday, Wednesday, and Friday.   spironolactone  25 MG tablet Commonly known as: ALDACTONE  Take 1 tablet (25 mg total) by mouth daily.        Contact information for follow-up providers     Alluri, Krishna C, MD. Go in 1 week(s).   Specialty: Cardiology Contact information: [address] [place] [address] [phone]         [facility] Cardiology - Electrophysiology. Go in 3 week(s).   Why: Appointment scheduled for 07/05/2024 at 8 AM Contact information: [address] [place], [place] [address] [phone]             Contact information for after-discharge care     Destination     [facility] .   Service: Skilled Nursing Contact information: [address] [place]  [phone]                    Discharge Exam: Francisco [name]   06/18/24 0500  06/19/24 0700 06/20/24 0500  Weight: 92.4 kg 96.5 kg 98.2 kg   General.  Frail elderly man, in no acute distress. Pulmonary.  Lungs clear bilaterally, normal respiratory effort. CV.  Regular rate and rhythm, Abdomen.  Soft, nontender, nondistended, BS positive. CNS.  Alert and oriented .  No focal neurologic deficit. Extremities.  No edema, no cyanosis, pulses intact and symmetrical. Psychiatry.  Judgment and insight appears normal.   Condition at discharge: stable  The results of significant diagnostics from this hospitalization (including imaging, microbiology, ancillary and laboratory) are listed below for reference.   Imaging Studies: ECHOCARDIOGRAM COMPLETE Result Date: 06/14/2024    ECHOCARDIOGRAM REPORT   Patient Name:   KARA MIERZEJEWSKI Date of Exam: 06/13/2024 Medical Rec #:  [id number]          Height:       70.0 in Accession #:    [id number]         Weight:       207.0 lb Date of Birth:  [birth year]          BSA:           2.118 m Patient Age:    36 years           BP:           117/56 mmHg Patient Gender: M                  HR:           55 bpm. Exam Location:  [facility] Procedure: 2D Echo, Color Doppler and Cardiac Doppler (Both Spectral and Color            Flow Doppler were utilized during procedure). Indications:     Abnormal ECG R94.31  History:         Patient has prior history of Echocardiogram examinations, most                  recent 06/01/2023. Abnormal ECG.  Sonographer:     Ashley McNeely-Sloane Referring Phys:  [id number] Hanya Guerin Diagnosing Phys: Sabina Custovic IMPRESSIONS  1. Left ventricular ejection fraction, by estimation, is 60 to 65%. The left ventricle has normal function. The left ventricle has no regional wall motion abnormalities. Left ventricular diastolic parameters are consistent with Grade I diastolic dysfunction (impaired relaxation).  2. Right ventricular systolic function is low normal. The right ventricular size is moderately enlarged. There is normal pulmonary artery systolic pressure. The estimated right ventricular systolic pressure is 33.6 mmHg.  3. Left atrial size was mildly dilated.  4. Right atrial size was severely dilated.  5. The mitral valve is normal in structure. No evidence of mitral valve regurgitation. No evidence of mitral stenosis.  6. Tricuspid valve regurgitation is mild to moderate.  7. The aortic valve is normal in structure. Aortic valve regurgitation is not visualized. No aortic stenosis is present.  8. The inferior vena cava is normal in size with greater than 50% respiratory variability, suggesting right atrial pressure of 3 mmHg. FINDINGS  Left Ventricle: Left ventricular ejection fraction, by estimation, is 60 to 65%. The left ventricle has normal function. The left ventricle has no regional wall motion abnormalities. The left ventricular internal cavity size was normal in size. There is  no left ventricular hypertrophy. Left ventricular diastolic parameters are consistent  with Grade I diastolic dysfunction (impaired relaxation). Right Ventricle: The right ventricular size is moderately enlarged. No increase in right ventricular wall thickness. Right ventricular systolic function  is low normal. There is normal pulmonary artery systolic pressure. The tricuspid regurgitant velocity  is 2.53 m/s, and with an assumed right atrial pressure of 8 mmHg, the estimated right ventricular systolic pressure is 33.6 mmHg. Left Atrium: Left atrial size was mildly dilated. Right Atrium: Right atrial size was severely dilated. Pericardium: There is no evidence of pericardial effusion. Mitral Valve: The mitral valve is normal in structure. No evidence of mitral valve regurgitation. No evidence of mitral valve stenosis. MV peak gradient, 4.0 mmHg. The mean mitral valve gradient is 1.0 mmHg. Tricuspid Valve: The tricuspid valve is normal in structure. Tricuspid valve regurgitation is mild to moderate. Aortic Valve: The aortic valve is normal in structure. Aortic valve regurgitation is not visualized. No aortic stenosis is present. Aortic valve mean gradient measures 4.0 mmHg. Aortic valve peak gradient measures 7.8 mmHg. Aortic valve area, by VTI measures 2.22 cm. Pulmonic Valve: The pulmonic valve was normal in structure. Pulmonic valve regurgitation is not visualized. Aorta: The aortic root is normal in size and structure. Venous: The inferior vena cava is normal in size with greater than 50% respiratory variability, suggesting right atrial pressure of 3 mmHg. IAS/Shunts: No atrial level shunt detected by color flow Doppler.  LEFT VENTRICLE PLAX 2D LVIDd:         4.20 cm     Diastology LVIDs:         2.20 cm     LV e' medial:    15.00 cm/s LV PW:         1.50 cm     LV E/e' medial:  6.3 LV IVS:        1.20 cm     LV e' lateral:   11.40 cm/s LVOT diam:     2.00 cm     LV E/e' lateral: 8.3 LV SV:         63 LV SV Index:   30 LVOT Area:     3.14 cm  LV Volumes (MOD) LV vol d, MOD A2C: 33.6 ml LV vol d,  MOD A4C: 37.0 ml LV vol s, MOD A2C: 17.1 ml LV vol s, MOD A4C: 14.4 ml LV SV MOD A2C:     16.5 ml LV SV MOD A4C:     37.0 ml LV SV MOD BP:      22.0 ml RIGHT VENTRICLE RV Basal diam:  5.50 cm RV Mid diam:    4.30 cm RV S prime:     11.90 cm/s TAPSE (M-mode): 1.8 cm LEFT ATRIUM           Index        RIGHT ATRIUM           Index LA diam:      4.30 cm 2.03 cm/m   RA Area:     27.30 cm LA Vol (A4C): 47.4 ml 22.38 ml/m  RA Volume:   92.90 ml  43.86 ml/m  AORTIC VALVE                    PULMONIC VALVE AV Area (Vmax):    2.22 cm     PV Vmax:        0.68 m/s AV Area (Vmean):   2.03 cm     PV Vmean:       44.900 cm/s AV Area (VTI):     2.22 cm     PV VTI:         0.108 m AV Vmax:  140.00 cm/s  PV Peak grad:   1.9 mmHg AV Vmean:          92.400 cm/s  PV Mean grad:   1.0 mmHg AV VTI:            0.285 m      RVOT Peak grad: 2 mmHg AV Peak Grad:      7.8 mmHg AV Mean Grad:      4.0 mmHg LVOT Vmax:         99.00 cm/s LVOT Vmean:        59.700 cm/s LVOT VTI:          0.201 m LVOT/AV VTI ratio: 0.71  AORTA Ao Root diam: 3.50 cm Ao Asc diam:  3.00 cm MITRAL VALVE               TRICUSPID VALVE MV Area (PHT): 2.80 cm    TR Peak grad:   25.6 mmHg MV Area VTI:   1.91 cm    TR Mean grad:   19.0 mmHg MV Peak grad:  4.0 mmHg    TR Vmax:        253.00 cm/s MV Mean grad:  1.0 mmHg    TR Vmean:       209.0 cm/s MV Vmax:       1.00 m/s MV Vmean:      50.3 cm/s   SHUNTS MV Decel Time: 271 msec    Systemic VTI:  0.20 m MV E velocity: 94.30 cm/s  Systemic Diam: 2.00 cm                            Pulmonic VTI:  0.117 m Annalee Custovic Electronically signed by Annalee Casa Signature Date/Time: 06/14/2024/8:41:18 AM    Final    MR BRAIN WO CONTRAST Result Date: 06/12/2024 CLINICAL DATA:  78 year old male with neurologic deficit, increasing lower extremity weakness. Paroxysmal atrial fibrillation. EXAM: MRI HEAD WITHOUT CONTRAST TECHNIQUE: Multiplanar, multiecho pulse sequences of the brain and surrounding structures were  obtained without intravenous contrast. COMPARISON:  Head CT yesterday.  Brain MRI 05/29/2023, and earlier. FINDINGS: Brain: Stable cerebral volume since last year. No restricted diffusion to suggest acute infarction. No midline shift, mass effect, evidence of mass lesion, ventriculomegaly, extra-axial collection or acute intracranial hemorrhage. Cervicomedullary junction and pituitary are within normal limits. Redemonstrated chronic small area of cortical encephalomalacia right middle frontal gyrus (series 9, image 39). Occasional chronic microhemorrhage in both cerebral hemispheres (minimal for age). Patchy bilateral periventricular white matter T2 and FLAIR hyperintensity which has progressed since a 2021 MRI, stable from last year and moderate for age. Mild to moderate chronic T2 heterogeneity also in the pons, also progressed since 2021. No other cortical encephalomalacia identified. Stable mild for age T2 heterogeneity in the deep gray nuclei. Cerebellum appears negative. Vascular: Major intracranial vascular flow voids are stable. Mild generalized intracranial artery tortuosity. Skull and upper cervical spine: Chronic cervical spine fusion, partially visible hardware artifact. Background bone marrow signal appears normal, degenerative ligamentous hypertrophy about the odontoid process does not appear significantly changed. Sinuses/Orbits: Stable, negative. Other: Mastoids well aerated.  Negative visible scalp and face. IMPRESSION: 1. No acute intracranial abnormality. 2. Chronic small and medium-sized vessel ischemia, overall moderate for age. Chronic small vessel changes do appear progressed in the cerebral white matter and pons since a 2021 MRI, but stable brain compared to MRI last year. 3. Chronic cervical spine operative fusion. Electronically Signed   By:  VEAR Hurst M.D.   On: 06/12/2024 04:28   DG Chest Port 1 View Result Date: 06/11/2024 CLINICAL DATA:  Edema. EXAM: PORTABLE CHEST 1 VIEW COMPARISON:   03/27/2024, CT 05/17/2024 FINDINGS: Chronic elevation of right hemidiaphragm with adjacent bandlike scarring. Mild cardiomegaly stable. No pulmonary edema. No significant pleural effusion. No acute airspace disease. No pneumothorax. IMPRESSION: 1. No acute chest findings. 2. Chronic elevation of right hemidiaphragm with adjacent scarring. Electronically Signed   By: Andrea Gasman M.D.   On: 06/11/2024 23:38   CT Head Wo Contrast Result Date: 06/11/2024 CLINICAL DATA:  Increased lower extremity weakness. EXAM: CT HEAD WITHOUT CONTRAST TECHNIQUE: Contiguous axial images were obtained from the base of the skull through the vertex without intravenous contrast. RADIATION DOSE REDUCTION: This exam was performed according to the departmental dose-optimization program which includes automated exposure control, adjustment of the mA and/or kV according to patient size and/or use of iterative reconstruction technique. COMPARISON:  May 17, 2024 FINDINGS: Brain: No evidence of acute infarction, hemorrhage, hydrocephalus, extra-axial collection or mass lesion/mass effect. Vascular: Moderate to marked severity bilateral cavernous carotid artery calcification is noted. Skull: Normal. Negative for fracture or focal lesion. Sinuses/Orbits: No acute finding. Other: None. IMPRESSION: No acute intracranial abnormality. Electronically Signed   By: Suzen Dials M.D.   On: 06/11/2024 23:27   US  Venous Img Lower Bilateral Result Date: 06/11/2024 CLINICAL DATA:  Bilateral leg pain. EXAM: BILATERAL LOWER EXTREMITY VENOUS DOPPLER ULTRASOUND TECHNIQUE: Gray-scale sonography with graded compression, as well as color Doppler and duplex ultrasound were performed to evaluate the lower extremity deep venous systems from the level of the common femoral vein and including the common femoral, femoral, profunda femoral, popliteal and calf veins including the posterior tibial, peroneal and gastrocnemius veins when visible. The  superficial great saphenous vein was also interrogated. Spectral Doppler was utilized to evaluate flow at rest and with distal augmentation maneuvers in the common femoral, femoral and popliteal veins. COMPARISON:  May 17, 2024 FINDINGS: RIGHT LOWER EXTREMITY Common Femoral Vein: No evidence of thrombus. Normal compressibility, respiratory phasicity and response to augmentation. Saphenofemoral Junction: No evidence of thrombus. Normal compressibility and flow on color Doppler imaging. Profunda Femoral Vein: No evidence of thrombus. Normal compressibility and flow on color Doppler imaging. Femoral Vein: No evidence of thrombus. Normal compressibility, respiratory phasicity and response to augmentation. Popliteal Vein: No evidence of thrombus. Normal compressibility, respiratory phasicity and response to augmentation. Calf Veins: There is limited visualization of the LEFT posterior tibial vein and LEFT peroneal vein, without evidence of thrombus. Normal compressibility and flow on color Doppler imaging. Superficial Great Saphenous Vein: No evidence of thrombus. Normal compressibility. Venous Reflux:  None. Other Findings: A 5.6 cm x 1.4 cm x 5.1 cm complex fluid collection is seen within the soft tissues of the RIGHT popliteal fossa. LEFT LOWER EXTREMITY Common Femoral Vein: No evidence of thrombus. Normal compressibility, respiratory phasicity and response to augmentation. Saphenofemoral Junction: No evidence of thrombus. Normal compressibility and flow on color Doppler imaging. Profunda Femoral Vein: No evidence of thrombus. Normal compressibility and flow on color Doppler imaging. Femoral Vein: No evidence of thrombus. Normal compressibility, respiratory phasicity and response to augmentation. Popliteal Vein: No evidence of thrombus. Normal compressibility, respiratory phasicity and response to augmentation. Calf Veins: The LEFT peroneal vein is poorly visualized with limited visualization of the LEFT  posterior tibial vein. No evidence of thrombus within the LEFT posterior tibial vein. Normal compressibility and flow on color Doppler imaging. Superficial Great Saphenous Vein: No  evidence of thrombus. Normal compressibility. Venous Reflux:  None. Other Findings: A 2.7 cm x 1.1 cm x 1.9 cm heterogeneous hypoechoic area is seen within the soft tissues of the LEFT popliteal fossa. No abnormal flow is seen within this region on color Doppler evaluation. IMPRESSION: 1. Limited evaluation of the BILATERAL calf veins, without evidence of deep venous thrombosis in either lower extremity. 2. 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 Electronically Signed   By: Suzen Dials M.D.   On: 06/11/2024 23:25    Microbiology: Results for orders placed or performed during the hospital encounter of 06/11/24  Culture, blood (Routine X 2) w Reflex to ID Panel     Status: None   Collection Time: 06/12/24  1:28 AM   Specimen: BLOOD  Result Value Ref Range Status   Specimen Description BLOOD BLOOD LEFT ARM  Final   Special Requests   Final    BOTTLES DRAWN AEROBIC AND ANAEROBIC Blood Culture adequate volume   Culture   Final    NO GROWTH 5 DAYS Performed at [facility], [address] [place], [place] [address]    Report Status 06/17/2024 FINAL  Final  Culture, blood (Routine X 2) w Reflex to ID Panel     Status: None   Collection Time: 06/12/24  1:28 AM   Specimen: BLOOD  Result Value Ref Range Status   Specimen Description BLOOD BLOOD RIGHT ARM  Final   Special Requests   Final    BOTTLES DRAWN AEROBIC AND ANAEROBIC Blood Culture adequate volume   Culture   Final    NO GROWTH 5 DAYS Performed at [facility], [address] [place], [place] [address]    Report Status 06/17/2024 FINAL  Final    Labs: CBC: Recent Labs  Lab 06/18/24 1359 06/19/24 0423 06/20/24 0314  WBC 8.8 8.3 7.6  HGB 12.7* 11.8* 11.4*  HCT 38.9* 36.8* 35.1*  MCV 104.3* 106.1* 107.0*   PLT 243 237 228   Basic Metabolic Panel: Recent Labs  Lab 06/15/24 0504 06/16/24 0350 06/17/24 0421 06/19/24 0423 06/20/24 0314  NA 141 142 141 136 134*  K 3.6 3.4* 4.1 4.1 3.8  CL 98 100 98 97* 96*  CO2 29 30 31 29 27   GLUCOSE 91 88 105* 92 79  BUN 40* 37* 35* 53* 48*  CREATININE 1.51* 1.41* 1.53* 2.38* 2.11*  CALCIUM  8.7* 8.9 9.2 8.8* 8.6*  MG  --   --  2.5*  --   --    Liver Function Tests: No results for input(s): AST, ALT, ALKPHOS, BILITOT, PROT, ALBUMIN  in the last 168 hours. CBG: No results for input(s): GLUCAP in the last 168 hours.  Discharge time spent: greater than 30 minutes.  This record has been created using Conservation officer, historic buildings. Errors have been sought and corrected,but may not always be located. Such creation errors do not reflect on the standard of care.   Signed: Amaryllis Dare, MD [facility] 06/20/2024

## 2024-06-20 NOTE — TOC Progression Note (Signed)
 Transition of Care [facility]) - Progression Note    Patient Details  Name: Francisco [name] MRN: [id number] Date of Birth: July 29, 1946  Transition of Care [facility]) CM/SW Contact  Delphine KANDICE Bring, RN Phone Number: 06/20/2024, 2:45 PM  Clinical Narrative:    Patient's insurance [employer], [id number], is good until midnight tonight. [facility] can accept patient today. CM spoke with patient about d/c to  [place]. Patient is in agreement. Transportation arranged with [employer]. Patient is 6th in line per Alm at [employer].  CM gave nurse number to call report, [phone]  Discharge order in                      Expected Discharge Plan and Services         Expected Discharge Date: 06/20/24                                     Social Drivers of Health (SDOH) Interventions SDOH Screenings   Food Insecurity: No Food Insecurity (06/12/2024)  Housing: Low Risk  (06/12/2024)  Transportation Needs: No Transportation Needs (06/12/2024)  Recent Concern: Transportation Needs - Unmet Transportation Needs (05/17/2024)  Utilities: Not At Risk (06/12/2024)  Depression (PHQ2-9): Low Risk  (08/30/2021)  Financial Resource Strain: Low Risk  (04/30/2024)   Received from [facility]  Social Connections: Moderately Isolated (06/12/2024)  Tobacco Use: Medium Risk (06/12/2024)    Readmission Risk Interventions     No data to display

## 2024-07-23 ENCOUNTER — Ambulatory Visit (INDEPENDENT_AMBULATORY_CARE_PROVIDER_SITE_OTHER): Admitting: Vascular Surgery

## 2024-07-23 ENCOUNTER — Encounter (INDEPENDENT_AMBULATORY_CARE_PROVIDER_SITE_OTHER)

## 2024-07-26 ENCOUNTER — Other Ambulatory Visit (INDEPENDENT_AMBULATORY_CARE_PROVIDER_SITE_OTHER): Payer: Self-pay | Admitting: Vascular Surgery

## 2024-07-26 DIAGNOSIS — M7989 Other specified soft tissue disorders: Secondary | ICD-10-CM

## 2024-07-30 ENCOUNTER — Ambulatory Visit (INDEPENDENT_AMBULATORY_CARE_PROVIDER_SITE_OTHER): Admitting: Nurse Practitioner

## 2024-07-30 ENCOUNTER — Encounter (INDEPENDENT_AMBULATORY_CARE_PROVIDER_SITE_OTHER)

## 2024-07-30 ENCOUNTER — Encounter (INDEPENDENT_AMBULATORY_CARE_PROVIDER_SITE_OTHER): Payer: Medicare Other

## 2024-07-30 ENCOUNTER — Ambulatory Visit (INDEPENDENT_AMBULATORY_CARE_PROVIDER_SITE_OTHER): Payer: Medicare Other | Admitting: Vascular Surgery

## 2024-08-16 NOTE — Progress Notes (Unsigned)
 Referring Physician:  Fernande Ophelia JINNY DOUGLAS, MD [address] [facility] [place],  [place] [address]  Primary Physician:  Fernande Ophelia JINNY DOUGLAS, MD  History of Present Illness: 08/22/2024 [name] has a history of acute on chronic diastolic CHF, HTN, myocardial injury, PAD, paroxysmal afib, SVT, COPD, hypothyroidism, parkinson disease, CKD stage 3, anemia of chronic disease, prostate CA, CVA, hyperlipidemia, merkel cell carcinoma, obesity, GERD, OSA, myastenia gravis.   He sees pulmonary for diaphragmatic paresis.   History of ACDF C5-C7, posterior cervical fusion C3-C6, and lumbar laminectomy L2-L3.   Was told after lumbar surgery that he would never be able to walk again.  He has intermittent LBP with numbness, tingling, and weakness in his legs. Left leg > right leg. He also notes weakness in his back as well. No leg pain. He can only stand for about a minute (can stand to pivot transfer), but he is not able to ambulate. He notes frequent falls.   He's been in PT for about 4 months and feels like he is getting stronger.   No neck or arm pain. He notes tingling in left hand. No numbness or tingling. No dexterity issues. He notes weakness in left arm since his cervical surgeries.   He takes lyrica . He is on ELIQUIS .   Tobacco use: Does not smoke.   Bowel/Bladder Dysfunction: none  Conservative measures:  Physical therapy:  is participating at [facility] (called to get those notes)- he's been going for about 4 months Multimodal medical therapy including regular antiinflammatories:  Tylenol , Hydrocodone  Injections:   10/02/2017-  nerve root block and transforaminal ESI on the left at the L5-S1 foramen.  05/22/2017-  nerve root block and transforaminal ESI on the right at the L5-S1 foramen.  02/02/2017- nerve root block and transforaminal ESI on the left at the L3-4 foramen.   Past Surgery:  05/03/2021- L2-3 decompressive laminectomy with foraminotomies Dr  Louis 08/07/2020- C3-6 POSTERIOR FUSION WITH DECOMPRESSION Dr. Reeves Daisy  07/2011- ACDF C5-6, C6-7   Francisco [name] has no symptoms of cervical myelopathy.  The symptoms are causing a significant impact on the patient's life.   Review of Systems:  A 10 point review of systems is negative, except for the pertinent positives and negatives detailed in the HPI.  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   Carotid stenosis    CHF (congestive heart failure) (HCC)    CKD (chronic kidney disease)    CKD (chronic kidney disease) stage 3, GFR 30-59 ml/min (HCC)    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   DDD (degenerative disc disease), cervical    Dysrhythmia    post carotid stent bradycardia; PAF 09/2020   GERD (gastroesophageal reflux disease)    Hypercholesterolemia    Hypertension    Hypothyroidism    pt takes Levothyroxine  daily   Lumbosacral spinal stenosis    Myasthenia gravis, adult form (HCC)    PAD (peripheral artery disease)    Parkinson disease (HCC)    Shortness of breath    Lung MD- Dr Burnett Servant   Sleep apnea  do not use CPAP every night   Stroke (HCC) 05/31/2023    Past Surgical History: Past Surgical History:  Procedure Laterality Date   ANTERIOR CERVICAL DECOMP/DISCECTOMY FUSION  07/18/2011   Procedure: ANTERIOR CERVICAL DECOMPRESSION/DISCECTOMY FUSION 2 LEVELS;  Surgeon: Victory LABOR Pool;  Location: [facility];  Service: Neurosurgery;  Laterality: N/A;  cervical five-six, cervical six-seven anterior cervical discectomy and fusion   ATRIAL FIBRILLATION ABLATION     BACK SURGERY     in 1985 [facility]   BILATERAL CARPAL TUNNEL RELEASE      01/2020 Right, 04/2020 Left   CARDIAC CATHETERIZATION     2005 at [facility], no stents   CARDIOVERSION N/A 02/22/2024   Procedure: CARDIOVERSION;  Surgeon: Alluri, Keller BROCKS, MD;  Location: [facility] ORS;  Service: Cardiovascular;  Laterality: N/A;   CAROTID PTA/STENT INTERVENTION N/A 09/17/2020   Procedure: CAROTID PTA/STENT INTERVENTION;  Surgeon: Marea Selinda RAMAN, MD;  Location: [facility] INVASIVE CV LAB;  Service: Cardiovascular;  Laterality: N/A;   CATARACT EXTRACTION W/PHACO Left 01/06/2020   Procedure: CATARACT EXTRACTION PHACO AND INTRAOCULAR LENS PLACEMENT (IOC) ISTENT INJ LEFT 3.81  00:33.3;  Surgeon: Myrna Adine Anes, MD;  Location: [facility];  Service: Ophthalmology;  Laterality: Left;   CATARACT EXTRACTION W/PHACO Right 02/03/2020   Procedure: CATARACT EXTRACTION PHACO AND INTRAOCULAR LENS PLACEMENT (IOC) RIGHT ISTENT INJ;  Surgeon: Myrna Adine Anes, MD;  Location: [facility];  Service: Ophthalmology;  Laterality: Right;  4.29 0:35.6   COLONOSCOPY     HERNIA REPAIR Left    inguinal hernia repair in 1985   LUMBAR LAMINECTOMY/DECOMPRESSION MICRODISCECTOMY Left 02/24/2014   Procedure: LUMBAR LAMINECTOMY/DECOMPRESSION MICRODISCECTOMY LUMBAR THREE-FOUR, FOUR-FIVE, LEFT FIVE-SACRAL ONE ;  Surgeon: Victory LABOR Gunnels, MD;  Location: [facility];  Service: Neurosurgery;  Laterality: Left;  LUMBAR LAMINECTOMY/DECOMPRESSION MICRODISCECTOMY LUMBAR THREE-FOUR, FOUR-FIVE, LEFT FIVE-SACRAL ONE    LUMBAR LAMINECTOMY/DECOMPRESSION MICRODISCECTOMY N/A 05/03/2021   Procedure: Laminectomy and Foraminotomy - L2-L3;  Surgeon: Gunnels Victory, MD;  Location: [facility] OR;  Service: Neurosurgery;  Laterality: N/A;  3C   POSTERIOR CERVICAL FUSION/FORAMINOTOMY N/A 08/07/2020   Procedure: C3-6 POSTERIOR FUSION WITH DECOMPRESSION;  Surgeon: Clois Fret, MD;  Location: [facility] ORS;  Service: Neurosurgery;  Laterality: N/A;   PROSTATECTOMY  04/2013   [facility] Dr Francisco Geralds    TEE WITHOUT CARDIOVERSION N/A 02/22/2024    Procedure: ECHOCARDIOGRAM, TRANSESOPHAGEAL;  Surgeon: Alluri, Keller BROCKS, MD;  Location: [facility] ORS;  Service: Cardiovascular;  Laterality: N/A;    Allergies: Allergies as of 08/22/2024 - Review Complete 08/22/2024  Allergen Reaction Noted   Azithromycin Other (See Comments) 03/06/2014   Codeine Nausea And Vomiting 07/11/2011    Medications: Outpatient Encounter Medications as of 08/22/2024  Medication Sig   amiodarone  (PACERONE ) 200 MG tablet Take 200 mg by mouth daily.   apixaban  (ELIQUIS ) 5 MG TABS tablet Take 5 mg by mouth 2 (two) times daily.   azaTHIOprine  (IMURAN ) 50 MG tablet Take 150 mg by mouth daily.    bumetanide  (BUMEX ) 2 MG tablet Take 2 mg by mouth.   carbidopa -levodopa  (SINEMET  IR) 25-100 MG tablet Take 1 tablet by mouth 3 (three) times daily.   Cholecalciferol  (D3-1000) 25 MCG (1000 UT) capsule Take 2,000 Units by mouth daily.   cyanocobalamin  (VITAMIN B12) 1000 MCG tablet Take 1,000 mcg by mouth daily.   levothyroxine  (SYNTHROID ) 50 MCG tablet Take 1 tablet (50 mcg total) by mouth daily at 6 (six) AM.   meclizine  (ANTIVERT ) 25 MG tablet Take 1 tablet by mouth 3 (three)  times daily as needed for dizziness.   metoprolol  tartrate (LOPRESSOR ) 25 MG tablet Take 12.5 mg by mouth 2 (two) times daily.   pregabalin  (LYRICA ) 50 MG capsule Take 50 mg by mouth 2 (two) times daily.   spironolactone  (ALDACTONE ) 25 MG tablet Take 1 tablet (25 mg total) by mouth daily.   torsemide  (DEMADEX ) 20 MG tablet Take 20 mg by mouth.   traZODone  (DESYREL ) 50 MG tablet Take 50 mg by mouth at bedtime.   [DISCONTINUED] predniSONE  (DELTASONE ) 10 MG tablet Take 10 mg by mouth every Monday, Wednesday, and Friday.   [DISCONTINUED] acetaminophen  (TYLENOL ) 325 MG tablet Take 2 tablets (650 mg total) by mouth every 6 (six) hours as needed for mild pain (pain score 1-3) or fever (or Fever >/= 101).   [DISCONTINUED] artificial tears ophthalmic solution Place 1 drop into both eyes as needed for dry eyes.    [DISCONTINUED] dextromethorphan -guaiFENesin  (MUCINEX  DM) 30-600 MG 12hr tablet Take 1 tablet by mouth 2 (two) times daily as needed for cough.   [DISCONTINUED] feeding supplement (ENSURE PLUS HIGH PROTEIN) LIQD Take 237 mLs by mouth 2 (two) times daily between meals.   [DISCONTINUED] HYDROcodone -acetaminophen  (NORCO) 10-325 MG tablet Take 1-2 tablets by mouth every 6 (six) hours as needed for moderate pain (pain score 4-6) or severe pain (pain score 7-10).   [DISCONTINUED] polyethylene glycol (MIRALAX  / GLYCOLAX ) 17 g packet Take 17 g by mouth daily. Skip the dose if no constipation   No facility-administered encounter medications on file as of 08/22/2024.    Social History: Social History   Tobacco Use   Smoking status: Former    Current packs/day: 0.00    Average packs/day: 1 pack/day for 20.0 years (20.0 ttl pk-yrs)    Types: Cigarettes    Start date: 09/12/1981    Quit date: 09/12/2001    Years since quitting: 22.9   Smokeless tobacco: Never  Vaping Use   Vaping status: Never Used  Substance Use Topics   Alcohol  use: Yes    Alcohol /week: 3.0 standard drinks of alcohol     Types: 3 Glasses of wine per week    Comment: 3 glasses a wine a week   Drug use: No    Family Medical History: Family History  Problem Relation Age of Onset   Hypertension Mother    Stroke Mother    Stroke Father     Physical Examination: Vitals:   08/22/24 1008  BP: 130/82      Awake, alert, oriented to person, place, and time.  Speech is clear and fluent. Fund of knowledge is appropriate.   Cranial Nerves: Pupils equal round and reactive to light.  Facial tone is symmetric.    Well healed posterior lumbar incision. No tenderness.   No abnormal lesions on exposed skin.   Strength: Side Biceps Triceps Deltoid Interossei Grip Wrist Ext. Wrist Flex.  R 5 5 5 5 5 5 5   L 4 4 5 4 5 5 5    Side Iliopsoas Quads Hamstring PF DF EHL  R 5 5 5 5 5 5   L 3 4- 4+ 4+ 3 3   Reflexes are 3+ and symmetric  at the biceps, brachioradialis, patella and achilles.   Hoffman's is absent.  Clonus is not present.   Bilateral upper and lower extremity sensation is intact to light touch.     Gait not tested. He is in a motorized scooter.   Medical Decision Making  Imaging: Lumbar MRI dated 05/18/24:  FINDINGS: The study is  motion degraded, most notably on axial sequences including severe motion on the axial T2 sequence.   Segmentation: Standard.   Alignment:  Unchanged grade 1 retrolisthesis of L5 on S1.   Vertebrae: No fracture or suspicious marrow lesion. New degenerative endplate changes at L4-5 including mild degenerative edema.   Conus medullaris and cauda equina: Conus extends to the L2 level and is normal in signal. Limited assessment of the cauda equina due to motion.   Paraspinal and other soft tissues: Postoperative changes in the posterior lumbar soft tissues. Bilateral renal atrophy.   Disc levels:   Disc desiccation throughout the lumbar spine with mild disc space narrowing at L1-2 and L2-3, moderate narrowing at L3-4, and moderate to severe narrowing at L4-5 and L5-S1.   T12-L1: Mild disc bulging without evidence of significant stenosis.   L1-2: Disc bulging, prominent epidural fat, and mild facet hypertrophy result in mild spinal stenosis without significant neural foraminal stenosis, similar to prior.   L2-3: Interval laminectomies. Disc bulging and moderate facet hypertrophy without evidence of significant stenosis.   L3-4: Previous laminectomies. Disc bulging, a right paracentral to subarticular disc extrusion with mild cephalad migration, bilateral foraminal disc protrusions, and moderate facet hypertrophy result in mild-to-moderate spinal stenosis, moderate to severe right and mild-to-moderate left lateral recess stenosis, and severe bilateral neural foraminal stenosis, similar to prior. Potential bilateral L3 and right L4 nerve root impingement.   L4-5:  Previous laminectomies. Circumferential disc bulging, a left foraminal disc extrusion, and severe facet hypertrophy result in mild spinal stenosis, likely moderate bilateral lateral recess stenosis, and severe left greater than right neural foraminal stenosis, mildly progressed. Potential bilateral L4 and L5 nerve root impingement.   L5-S1: Previous laminectomies. Circumferential disc bulging and moderate to severe facet hypertrophy result in mild-to-moderate bilateral lateral recess stenosis and severe bilateral neural foraminal stenosis without significant spinal stenosis, similar to prior. Potential bilateral L5 nerve root impingement.   IMPRESSION: 1. Motion degraded examination. 2. Interval L2-3 posterior decompression without significant residual stenosis. 3. Progressive disc degeneration at L4-5 with mild spinal stenosis and severe bilateral neural foraminal stenosis. 4. Unchanged severe bilateral neural foraminal stenosis at L3-4 and L5-S1.     Electronically Signed   By: Dasie Hamburg M.D.   On: 05/18/2024 19:30   Cervical CT scan dated 05/17/24:  FINDINGS:   CERVICAL SPINE:   BONES AND ALIGNMENT: The patient is again noted to be status post bilateral laminectomies and bilateral posterolateral spinal fusion of C3 through C5. There is loosening of the orthopedic screws at C3 again demonstrated bilaterally. The patient is also again noted to be status post ACDF (anterior cervical discectomy and fusion) of C5 through C7, with completed fusion. There is no definite evidence of acute traumatic injury. The study is degraded by patient motion.   DEGENERATIVE CHANGES: No significant degenerative changes.   SOFT TISSUES: No prevertebral soft tissue swelling.   VASCULATURE: A vascular stent is noted within the right common carotid artery.   IMPRESSION: 1. No definite evidence of acute traumatic injury. 2. Status post bilateral laminectomies and bilateral posterolateral  spinal fusion of C3 through C5 with loosening of the orthopedic screws at C3 bilaterally. 3. Status post ACDF of C5 through C7 with completed fusion. 4. Study degraded by patient motion.   Electronically signed by: Evalene Coho MD 05/17/2024 11:51 AM EDT RP Workstation: [id number]  I have personally reviewed the images and agree with the above interpretation.  He appears to be fused posteriorly C3-C5.   Assessment  and Plan: [name] has history of cervical surgery x 2. Had lumbar laminectomy L2-L3 by Dr. Louis in 2023.   He's had weakness in his legs, left > right, since above surgery and states he has not been able to walk. He has minimal intermittent LBP. No leg pain.   He has known moderate central stenosis L3-L4, mild central stenosis L4-L5, and severe bilateral foraminal stenosis L3-S1.   He has weakness in left > right leg.    He has no neck  or arm pain. Has tingling in left hand. No dexterity issues. He's had some left arm weakness since his cervical surgeries.   CT shows loosening of C3 screws, but he appears to be healed posteriorly C3-C5.   Treatment options discussed with patient and following plan made:   - Will review above imaging with Dr. Clois to determine further treatment options.  - Recommend he continue with PT.  - Will call him with further recommendations.   I spent a total of 35 minutes in face-to-face and non-face-to-face activities related to this patient's care today including review of outside records, review of imaging, review of symptoms, physical exam, discussion of differential diagnosis, discussion of treatment options, and documentation.   Thank you for involving me in the care of this patient.   ADDENDUM 08/22/24:  Patient reviewed with Dr. Clois. Symptoms may be from stenosis in lumbar spine, but his MRI is poor quality. He recommends getting updated lumbar MRI scan. Will call patient and let him know.   Glade Boys PA-C Dept.  of Neurosurgery

## 2024-08-22 ENCOUNTER — Telehealth: Payer: Self-pay | Admitting: Orthopedic Surgery

## 2024-08-22 ENCOUNTER — Ambulatory Visit (INDEPENDENT_AMBULATORY_CARE_PROVIDER_SITE_OTHER): Admitting: Orthopedic Surgery

## 2024-08-22 ENCOUNTER — Encounter: Payer: Self-pay | Admitting: Orthopedic Surgery

## 2024-08-22 VITALS — BP 130/82 | Wt 216.0 lb

## 2024-08-22 DIAGNOSIS — Z9889 Other specified postprocedural states: Secondary | ICD-10-CM | POA: Diagnosis not present

## 2024-08-22 DIAGNOSIS — Z981 Arthrodesis status: Secondary | ICD-10-CM

## 2024-08-22 DIAGNOSIS — R29898 Other symptoms and signs involving the musculoskeletal system: Secondary | ICD-10-CM

## 2024-08-22 DIAGNOSIS — M47816 Spondylosis without myelopathy or radiculopathy, lumbar region: Secondary | ICD-10-CM

## 2024-08-22 DIAGNOSIS — M48061 Spinal stenosis, lumbar region without neurogenic claudication: Secondary | ICD-10-CM

## 2024-08-22 NOTE — Telephone Encounter (Signed)
 Please call and let him know that I reviewed everything with Dr. Clois.   He thinks his symptoms may be from stenosis in his lower back, but his last MRI was poor quality.   Dr. Clois would like to get a new lumbar MRI.   If he wants to do this, let me know and I'll order it. Let me know where he wants to get MRI done.

## 2024-08-22 NOTE — Telephone Encounter (Signed)
 Left a message for the patient to call back.

## 2024-08-23 NOTE — Telephone Encounter (Signed)
 I didn't place order because I was unsure if you wanted w/o contrast or with/without.   I spoke with the patient and notified him of the message below. He verbalized understanding and would like to proceed with the MRI. He is fine with going to [facility].

## 2024-08-23 NOTE — Telephone Encounter (Signed)
 Left a message for the patient to call back.

## 2024-08-23 NOTE — Addendum Note (Signed)
 Addended byBETHA HILMA HASTINGS on: 08/23/2024 03:13 PM   Modules accepted: Orders

## 2024-09-17 ENCOUNTER — Ambulatory Visit
Admission: RE | Admit: 2024-09-17 | Discharge: 2024-09-17 | Disposition: A | Discharge status: Home / Self Care | Source: Ambulatory Visit | Attending: Orthopedic Surgery | Admitting: Orthopedic Surgery

## 2024-09-17 DIAGNOSIS — Z9889 Other specified postprocedural states: Secondary | ICD-10-CM

## 2024-09-17 DIAGNOSIS — M48061 Spinal stenosis, lumbar region without neurogenic claudication: Secondary | ICD-10-CM

## 2024-09-17 DIAGNOSIS — M47816 Spondylosis without myelopathy or radiculopathy, lumbar region: Secondary | ICD-10-CM

## 2024-09-17 DIAGNOSIS — R29898 Other symptoms and signs involving the musculoskeletal system: Secondary | ICD-10-CM

## 2024-09-27 ENCOUNTER — Telehealth: Payer: Self-pay | Admitting: Orthopedic Surgery

## 2024-09-27 NOTE — Telephone Encounter (Signed)
 Lumbar MRI results reviewed.   Please call and let him know I want him to follow up with Dr. Clois to discuss further options (do 30 minute f/u appt- discuss possible surgery per Glade).   If he wants to do phone visit with me prior to that to discuss his MRI results then you can schedule or he can just review results at his visit with Dr. Clois.

## 2024-10-17 ENCOUNTER — Ambulatory Visit: Admitting: Urology

## 2024-10-17 DIAGNOSIS — N401 Enlarged prostate with lower urinary tract symptoms: Secondary | ICD-10-CM

## 2024-10-17 NOTE — Progress Notes (Unsigned)
 "   Referring Physician:  Fernande Ophelia JINNY DOUGLAS, MD [address] [facility] [place],  [place] [address]  Primary Physician:  Fernande Ophelia JINNY DOUGLAS, MD  History of Present Illness: 10/17/2024 Mr. Armas Mcbee is here today with a chief complaint of ***  Discuss options per [facility] and review new MRI.   Intermittent low back pain with numbness, tingling and weakness in his legs. He also has frequent falls.   Participated in PT at [facility], his initial evaluation was on 07/01/24. I did call and request the notes on 10/17/24.  LEELYND MALDONADO has ***no symptoms of cervical myelopathy.  The symptoms are causing a significant impact on the patient's life.   I have utilized the care everywhere function in epic to review the outside records available from external health systems.  Progress Note from Glade Boys, [place] on 08/22/24:  History of Present Illness: 08/22/2024 Mr. Erubiel Manasco has a history of acute on chronic diastolic CHF, HTN, myocardial injury, PAD, paroxysmal afib, SVT, COPD, hypothyroidism, parkinson disease, CKD stage 3, anemia of chronic disease, prostate CA, CVA, hyperlipidemia, merkel cell carcinoma, obesity, GERD, OSA, myastenia gravis.    He sees pulmonary for diaphragmatic paresis.    History of ACDF C5-C7, posterior cervical fusion C3-C6, and lumbar laminectomy L2-L3.    Was told after lumbar surgery that he would never be able to walk again.   He has intermittent LBP with numbness, tingling, and weakness in his legs. Left leg > right leg. He also notes weakness in his back as well. No leg pain. He can only stand for about a minute (can stand to pivot transfer), but he is not able to ambulate. He notes frequent falls.    He's been in PT for about 4 months and feels like he is getting stronger.    No neck or arm pain. He notes tingling in left hand. No numbness or tingling. No dexterity issues. He notes weakness in left arm since his cervical  surgeries.    He takes lyrica . He is on ELIQUIS .    Tobacco use: Does not smoke.    Bowel/Bladder Dysfunction: none   Conservative measures:  Physical therapy:  is participating at [facility] (called to get those notes)- he's been going for about 4 months Multimodal medical therapy including regular antiinflammatories:  Tylenol , Hydrocodone  Injections:   10/02/2017-  nerve root block and transforaminal ESI on the left at the L5-S1 foramen.  05/22/2017-  nerve root block and transforaminal ESI on the right at the L5-S1 foramen.  02/02/2017- nerve root block and transforaminal ESI on the left at the L3-4 foramen.    Past Surgery:  05/03/2021- L2-3 decompressive laminectomy with foraminotomies Dr Louis 08/07/2020- C3-6 POSTERIOR FUSION WITH DECOMPRESSION Dr. Reeves Daisy  07/2011- ACDF C5-6, C6-7   Review of Systems:  A 10 point review of systems is negative, except for the pertinent positives and negatives detailed in the HPI.  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   Carotid stenosis    CHF (congestive heart failure) (HCC)    CKD (chronic kidney disease)    CKD (chronic kidney  disease) stage 3, GFR 30-59 ml/min (HCC)    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   DDD (degenerative disc disease), cervical    Dysrhythmia    post carotid stent bradycardia; PAF 09/2020   GERD (gastroesophageal reflux disease)    Hypercholesterolemia    Hypertension    Hypothyroidism    pt takes Levothyroxine  daily   Lumbosacral spinal stenosis    Myasthenia gravis, adult form (HCC)    PAD (peripheral artery disease)    Parkinson disease (HCC)    Shortness of breath    Lung MD- Dr  Burnett Servant   Sleep apnea    do not use CPAP every night   Stroke (HCC) 05/31/2023    Past Surgical History: Past Surgical History:  Procedure Laterality Date   ANTERIOR CERVICAL DECOMP/DISCECTOMY FUSION  07/18/2011   Procedure: ANTERIOR CERVICAL DECOMPRESSION/DISCECTOMY FUSION 2 LEVELS;  Surgeon: Victory LABOR Pool;  Location: [facility];  Service: Neurosurgery;  Laterality: N/A;  cervical five-six, cervical six-seven anterior cervical discectomy and fusion   ATRIAL FIBRILLATION ABLATION     BACK SURGERY     in 1985 [facility]   BILATERAL CARPAL TUNNEL RELEASE     01/2020 Right, 04/2020 Left   CARDIAC CATHETERIZATION     2005 at [facility], no stents   CARDIOVERSION N/A 02/22/2024   Procedure: CARDIOVERSION;  Surgeon: Alluri, Keller BROCKS, MD;  Location: [facility] ORS;  Service: Cardiovascular;  Laterality: N/A;   CAROTID PTA/STENT INTERVENTION N/A 09/17/2020   Procedure: CAROTID PTA/STENT INTERVENTION;  Surgeon: Marea Selinda RAMAN, MD;  Location: [facility] INVASIVE CV LAB;  Service: Cardiovascular;  Laterality: N/A;   CATARACT EXTRACTION W/PHACO Left 01/06/2020   Procedure: CATARACT EXTRACTION PHACO AND INTRAOCULAR LENS PLACEMENT (IOC) ISTENT INJ LEFT 3.81  00:33.3;  Surgeon: Myrna Adine Anes, MD;  Location: [facility];  Service: Ophthalmology;  Laterality: Left;   CATARACT EXTRACTION W/PHACO Right 02/03/2020   Procedure: CATARACT EXTRACTION PHACO AND INTRAOCULAR LENS PLACEMENT (IOC) RIGHT ISTENT INJ;  Surgeon: Myrna Adine Anes, MD;  Location: [facility];  Service: Ophthalmology;  Laterality: Right;  4.29 0:35.6   COLONOSCOPY     HERNIA REPAIR Left    inguinal hernia repair in 1985   LUMBAR LAMINECTOMY/DECOMPRESSION MICRODISCECTOMY Left 02/24/2014   Procedure: LUMBAR LAMINECTOMY/DECOMPRESSION MICRODISCECTOMY LUMBAR THREE-FOUR, FOUR-FIVE, LEFT FIVE-SACRAL ONE ;  Surgeon: Victory LABOR Gunnels, MD;  Location: [facility];  Service: Neurosurgery;  Laterality: Left;  LUMBAR LAMINECTOMY/DECOMPRESSION  MICRODISCECTOMY LUMBAR THREE-FOUR, FOUR-FIVE, LEFT FIVE-SACRAL ONE    LUMBAR LAMINECTOMY/DECOMPRESSION MICRODISCECTOMY N/A 05/03/2021   Procedure: Laminectomy and Foraminotomy - L2-L3;  Surgeon: Gunnels Victory, MD;  Location: [facility] OR;  Service: Neurosurgery;  Laterality: N/A;  3C   POSTERIOR CERVICAL FUSION/FORAMINOTOMY N/A 08/07/2020   Procedure: C3-6 POSTERIOR FUSION WITH DECOMPRESSION;  Surgeon: Clois Fret, MD;  Location: [facility] ORS;  Service: Neurosurgery;  Laterality: N/A;   PROSTATECTOMY  04/2013   [facility] Dr ozell Geralds    TEE WITHOUT CARDIOVERSION N/A 02/22/2024   Procedure: ECHOCARDIOGRAM, TRANSESOPHAGEAL;  Surgeon: Alluri, Keller BROCKS, MD;  Location: [facility] ORS;  Service: Cardiovascular;  Laterality: N/A;    Allergies: Allergies as of 10/22/2024 - Review Complete 08/22/2024  Allergen Reaction Noted   Azithromycin Other (See Comments) 03/06/2014   Codeine Nausea And Vomiting 07/11/2011    Medications: Current Medications[1]  Social History: Social History[2]  Family Medical History: Family History  Problem Relation Age of Onset   Hypertension Mother    Stroke Mother    Stroke  Father     Physical Examination: There were no vitals filed for this visit.  General: Patient is in no apparent distress. Attention to examination is appropriate.  Neck:   Supple.  Full range of motion.  Respiratory: Patient is breathing without any difficulty.   NEUROLOGICAL:     Awake, alert, oriented to person, place, and time.  Speech is clear and fluent.   Cranial Nerves: Pupils equal round and reactive to light.  Facial tone is symmetric.  Facial sensation is symmetric. Shoulder shrug is symmetric. Tongue protrusion is midline.  There is no pronator drift.  Strength: Side Biceps Triceps Deltoid Interossei Grip Wrist Ext. Wrist Flex.  R 5 5 5 5 5 5 5   L 5 5 5 5 5 5 5    Side Iliopsoas Quads Hamstring PF DF EHL  R 5 5 5 5 5 5   L 5 5 5 5 5 5    Reflexes are ***2+ and symmetric at the  biceps, triceps, brachioradialis, patella and achilles.   Hoffman's is absent.   Bilateral upper and lower extremity sensation is intact to light touch.    No evidence of dysmetria noted.  Gait is normal.     Medical Decision Making  Imaging: ***  I have personally reviewed the images and agree with the above interpretation.  Assessment and Plan: Mr. Clemons is a pleasant 79 y.o. male with ***      Thank you for involving me in the care of this patient.      Chester K. Clois MD, [facility]     [1]  Current Outpatient Medications:    amiodarone  (PACERONE ) 200 MG tablet, Take 200 mg by mouth daily., Disp: , Rfl:    apixaban  (ELIQUIS ) 5 MG TABS tablet, Take 5 mg by mouth 2 (two) times daily., Disp: , Rfl:    azaTHIOprine  (IMURAN ) 50 MG tablet, Take 150 mg by mouth daily. , Disp: , Rfl:    bumetanide  (BUMEX ) 2 MG tablet, Take 2 mg by mouth., Disp: , Rfl:    carbidopa -levodopa  (SINEMET  IR) 25-100 MG tablet, Take 1 tablet by mouth 3 (three) times daily., Disp: , Rfl:    Cholecalciferol  (D3-1000) 25 MCG (1000 UT) capsule, Take 2,000 Units by mouth daily., Disp: , Rfl:    cyanocobalamin  (VITAMIN B12) 1000 MCG tablet, Take 1,000 mcg by mouth daily., Disp: , Rfl:    levothyroxine  (SYNTHROID ) 50 MCG tablet, Take 1 tablet (50 mcg total) by mouth daily at 6 (six) AM., Disp: 30 tablet, Rfl: 0   meclizine  (ANTIVERT ) 25 MG tablet, Take 1 tablet by mouth 3 (three) times daily as needed for dizziness., Disp: , Rfl:    metoprolol  tartrate (LOPRESSOR ) 25 MG tablet, Take 12.5 mg by mouth 2 (two) times daily., Disp: , Rfl:    pregabalin  (LYRICA ) 50 MG capsule, Take 50 mg by mouth 2 (two) times daily., Disp: , Rfl:    spironolactone  (ALDACTONE ) 25 MG tablet, Take 1 tablet (25 mg total) by mouth daily., Disp: , Rfl:    torsemide  (DEMADEX ) 20 MG tablet, Take 20 mg by mouth., Disp: , Rfl:    traZODone  (DESYREL ) 50 MG tablet, Take 50 mg by mouth at bedtime., Disp: , Rfl:  [2]   Social History Tobacco Use   Smoking status: Former    Current packs/day: 0.00    Average packs/day: 1 pack/day for 20.0 years (20.0 ttl pk-yrs)    Types: Cigarettes    Start date: 09/12/1981    Quit date: 09/12/2001  Years since quitting: 23.1   Smokeless tobacco: Never  Vaping Use   Vaping status: Never Used  Substance Use Topics   Alcohol  use: Yes    Alcohol /week: 3.0 standard drinks of alcohol     Types: 3 Glasses of wine per week    Comment: 3 glasses a wine a week   Drug use: No   "

## 2024-10-22 ENCOUNTER — Ambulatory Visit: Admitting: Neurosurgery
